# Patient Record
Sex: Female | Born: 1940 | Race: Black or African American | Hispanic: No | Marital: Married | Smoking: Former smoker
Health system: Southern US, Community
[De-identification: ages and names within clinical notes are randomized; demographics above are authoritative.]

## PROBLEM LIST (undated history)

## (undated) DIAGNOSIS — K922 Gastrointestinal hemorrhage, unspecified: Secondary | ICD-10-CM

## (undated) DIAGNOSIS — D649 Anemia, unspecified: Secondary | ICD-10-CM

## (undated) DIAGNOSIS — R011 Cardiac murmur, unspecified: Secondary | ICD-10-CM

## (undated) DIAGNOSIS — Z9289 Personal history of other medical treatment: Secondary | ICD-10-CM

## (undated) DIAGNOSIS — I5032 Chronic diastolic (congestive) heart failure: Secondary | ICD-10-CM

## (undated) DIAGNOSIS — J189 Pneumonia, unspecified organism: Secondary | ICD-10-CM

## (undated) DIAGNOSIS — L439 Lichen planus, unspecified: Secondary | ICD-10-CM

## (undated) DIAGNOSIS — E785 Hyperlipidemia, unspecified: Secondary | ICD-10-CM

## (undated) DIAGNOSIS — R569 Unspecified convulsions: Secondary | ICD-10-CM

## (undated) DIAGNOSIS — L602 Onychogryphosis: Secondary | ICD-10-CM

## (undated) DIAGNOSIS — Z8489 Family history of other specified conditions: Secondary | ICD-10-CM

## (undated) DIAGNOSIS — D509 Iron deficiency anemia, unspecified: Secondary | ICD-10-CM

## (undated) DIAGNOSIS — I272 Pulmonary hypertension, unspecified: Secondary | ICD-10-CM

## (undated) DIAGNOSIS — E119 Type 2 diabetes mellitus without complications: Secondary | ICD-10-CM

## (undated) DIAGNOSIS — Z87898 Personal history of other specified conditions: Secondary | ICD-10-CM

## (undated) DIAGNOSIS — K219 Gastro-esophageal reflux disease without esophagitis: Secondary | ICD-10-CM

## (undated) DIAGNOSIS — R040 Epistaxis: Secondary | ICD-10-CM

## (undated) DIAGNOSIS — I781 Nevus, non-neoplastic: Secondary | ICD-10-CM

## (undated) DIAGNOSIS — I780 Hereditary hemorrhagic telangiectasia: Secondary | ICD-10-CM

## (undated) DIAGNOSIS — K31819 Angiodysplasia of stomach and duodenum without bleeding: Secondary | ICD-10-CM

## (undated) DIAGNOSIS — I10 Essential (primary) hypertension: Secondary | ICD-10-CM

## (undated) HISTORY — DX: Nevus, non-neoplastic: I78.1

## (undated) HISTORY — PX: NASAL HEMORRHAGE CONTROL: SHX287

## (undated) HISTORY — DX: Essential (primary) hypertension: I10

## (undated) HISTORY — DX: Personal history of other specified conditions: Z87.898

## (undated) HISTORY — DX: Onychogryphosis: L60.2

## (undated) HISTORY — DX: Chronic diastolic (congestive) heart failure: I50.32

## (undated) HISTORY — DX: Pulmonary hypertension, unspecified: I27.20

## (undated) HISTORY — PX: CATARACT EXTRACTION: SUR2

## (undated) HISTORY — DX: Anemia, unspecified: D64.9

## (undated) HISTORY — DX: Hyperlipidemia, unspecified: E78.5

## (undated) HISTORY — DX: Lichen planus, unspecified: L43.9

---

## 1997-07-26 ENCOUNTER — Emergency Department (HOSPITAL_COMMUNITY): Admission: EM | Admit: 1997-07-26 | Discharge: 1997-07-26 | Payer: Self-pay | Admitting: Emergency Medicine

## 1998-02-03 ENCOUNTER — Emergency Department (HOSPITAL_COMMUNITY): Admission: EM | Admit: 1998-02-03 | Discharge: 1998-02-03 | Payer: Self-pay | Admitting: Emergency Medicine

## 1998-02-11 ENCOUNTER — Encounter: Payer: Self-pay | Admitting: Emergency Medicine

## 1998-02-11 ENCOUNTER — Emergency Department (HOSPITAL_COMMUNITY): Admission: EM | Admit: 1998-02-11 | Discharge: 1998-02-12 | Payer: Self-pay | Admitting: Emergency Medicine

## 1998-11-20 ENCOUNTER — Encounter: Admission: RE | Admit: 1998-11-20 | Discharge: 1998-11-20 | Payer: Self-pay | Admitting: *Deleted

## 1998-12-05 ENCOUNTER — Inpatient Hospital Stay (HOSPITAL_COMMUNITY): Admission: EM | Admit: 1998-12-05 | Discharge: 1998-12-06 | Payer: Self-pay | Admitting: *Deleted

## 1999-05-02 ENCOUNTER — Emergency Department (HOSPITAL_COMMUNITY): Admission: EM | Admit: 1999-05-02 | Discharge: 1999-05-02 | Payer: Self-pay | Admitting: Emergency Medicine

## 1999-06-21 ENCOUNTER — Emergency Department (HOSPITAL_COMMUNITY): Admission: EM | Admit: 1999-06-21 | Discharge: 1999-06-22 | Payer: Self-pay | Admitting: Emergency Medicine

## 1999-07-21 ENCOUNTER — Emergency Department (HOSPITAL_COMMUNITY): Admission: EM | Admit: 1999-07-21 | Discharge: 1999-07-22 | Payer: Self-pay | Admitting: *Deleted

## 1999-07-27 ENCOUNTER — Emergency Department (HOSPITAL_COMMUNITY): Admission: EM | Admit: 1999-07-27 | Discharge: 1999-07-28 | Payer: Self-pay | Admitting: Emergency Medicine

## 2000-01-24 ENCOUNTER — Ambulatory Visit (HOSPITAL_COMMUNITY): Admission: RE | Admit: 2000-01-24 | Discharge: 2000-01-24 | Payer: Self-pay | Admitting: Internal Medicine

## 2000-02-09 ENCOUNTER — Other Ambulatory Visit: Admission: RE | Admit: 2000-02-09 | Discharge: 2000-02-09 | Payer: Self-pay | Admitting: *Deleted

## 2000-02-09 ENCOUNTER — Encounter (INDEPENDENT_AMBULATORY_CARE_PROVIDER_SITE_OTHER): Payer: Self-pay | Admitting: Specialist

## 2000-04-01 ENCOUNTER — Ambulatory Visit (HOSPITAL_COMMUNITY): Admission: RE | Admit: 2000-04-01 | Discharge: 2000-04-01 | Payer: Self-pay | Admitting: *Deleted

## 2000-04-01 ENCOUNTER — Encounter (INDEPENDENT_AMBULATORY_CARE_PROVIDER_SITE_OTHER): Payer: Self-pay | Admitting: Specialist

## 2000-08-22 ENCOUNTER — Emergency Department (HOSPITAL_COMMUNITY): Admission: EM | Admit: 2000-08-22 | Discharge: 2000-08-22 | Payer: Self-pay | Admitting: Emergency Medicine

## 2000-08-22 ENCOUNTER — Encounter: Payer: Self-pay | Admitting: Emergency Medicine

## 2000-12-21 ENCOUNTER — Ambulatory Visit (HOSPITAL_COMMUNITY): Admission: RE | Admit: 2000-12-21 | Discharge: 2000-12-21 | Payer: Self-pay | Admitting: Family Medicine

## 2000-12-29 ENCOUNTER — Ambulatory Visit (HOSPITAL_COMMUNITY): Admission: RE | Admit: 2000-12-29 | Discharge: 2000-12-29 | Payer: Self-pay | Admitting: Family Medicine

## 2000-12-29 ENCOUNTER — Encounter: Payer: Self-pay | Admitting: Family Medicine

## 2001-02-18 ENCOUNTER — Ambulatory Visit (HOSPITAL_COMMUNITY): Admission: RE | Admit: 2001-02-18 | Discharge: 2001-02-18 | Payer: Self-pay | Admitting: Gastroenterology

## 2001-02-18 ENCOUNTER — Encounter (INDEPENDENT_AMBULATORY_CARE_PROVIDER_SITE_OTHER): Payer: Self-pay | Admitting: Specialist

## 2001-04-19 ENCOUNTER — Emergency Department (HOSPITAL_COMMUNITY): Admission: EM | Admit: 2001-04-19 | Discharge: 2001-04-19 | Payer: Self-pay

## 2001-05-31 ENCOUNTER — Encounter (HOSPITAL_COMMUNITY): Admission: RE | Admit: 2001-05-31 | Discharge: 2001-08-29 | Payer: Self-pay | Admitting: Family Medicine

## 2001-06-07 ENCOUNTER — Encounter: Payer: Self-pay | Admitting: Gastroenterology

## 2001-06-07 ENCOUNTER — Ambulatory Visit (HOSPITAL_COMMUNITY): Admission: RE | Admit: 2001-06-07 | Discharge: 2001-06-07 | Payer: Self-pay | Admitting: Gastroenterology

## 2001-08-03 ENCOUNTER — Ambulatory Visit (HOSPITAL_COMMUNITY): Admission: RE | Admit: 2001-08-03 | Discharge: 2001-08-03 | Payer: Self-pay | Admitting: Gastroenterology

## 2001-12-31 ENCOUNTER — Inpatient Hospital Stay (HOSPITAL_COMMUNITY): Admission: EM | Admit: 2001-12-31 | Discharge: 2002-01-02 | Payer: Self-pay

## 2002-01-01 ENCOUNTER — Encounter: Payer: Self-pay | Admitting: Family Medicine

## 2002-01-02 ENCOUNTER — Encounter: Payer: Self-pay | Admitting: Family Medicine

## 2002-01-03 ENCOUNTER — Ambulatory Visit (HOSPITAL_BASED_OUTPATIENT_CLINIC_OR_DEPARTMENT_OTHER): Admission: RE | Admit: 2002-01-03 | Discharge: 2002-01-03 | Payer: Self-pay | Admitting: Orthopedic Surgery

## 2002-10-25 ENCOUNTER — Encounter: Admission: RE | Admit: 2002-10-25 | Discharge: 2002-12-20 | Payer: Self-pay | Admitting: Internal Medicine

## 2003-06-11 ENCOUNTER — Emergency Department (HOSPITAL_COMMUNITY): Admission: EM | Admit: 2003-06-11 | Discharge: 2003-06-11 | Payer: Self-pay | Admitting: Emergency Medicine

## 2003-06-21 ENCOUNTER — Emergency Department (HOSPITAL_COMMUNITY): Admission: EM | Admit: 2003-06-21 | Discharge: 2003-06-21 | Payer: Self-pay | Admitting: Emergency Medicine

## 2003-07-06 ENCOUNTER — Observation Stay (HOSPITAL_COMMUNITY): Admission: EM | Admit: 2003-07-06 | Discharge: 2003-07-07 | Payer: Self-pay | Admitting: Emergency Medicine

## 2003-08-14 ENCOUNTER — Encounter (INDEPENDENT_AMBULATORY_CARE_PROVIDER_SITE_OTHER): Payer: Self-pay | Admitting: *Deleted

## 2003-08-14 LAB — CONVERTED CEMR LAB

## 2003-08-28 ENCOUNTER — Encounter: Admission: RE | Admit: 2003-08-28 | Discharge: 2003-08-28 | Payer: Self-pay | Admitting: Family Medicine

## 2003-09-28 ENCOUNTER — Encounter: Admission: RE | Admit: 2003-09-28 | Discharge: 2003-09-28 | Payer: Self-pay | Admitting: Family Medicine

## 2003-10-30 ENCOUNTER — Ambulatory Visit: Payer: Self-pay | Admitting: Family Medicine

## 2003-11-28 ENCOUNTER — Ambulatory Visit: Payer: Self-pay | Admitting: Sports Medicine

## 2003-12-26 ENCOUNTER — Ambulatory Visit: Payer: Self-pay | Admitting: Sports Medicine

## 2004-01-21 ENCOUNTER — Ambulatory Visit: Payer: Self-pay | Admitting: Sports Medicine

## 2004-03-05 ENCOUNTER — Ambulatory Visit: Payer: Self-pay | Admitting: Family Medicine

## 2004-03-06 ENCOUNTER — Encounter: Admission: RE | Admit: 2004-03-06 | Discharge: 2004-03-06 | Payer: Self-pay | Admitting: Sports Medicine

## 2004-04-07 ENCOUNTER — Ambulatory Visit: Payer: Self-pay | Admitting: Sports Medicine

## 2004-04-24 ENCOUNTER — Ambulatory Visit: Payer: Self-pay | Admitting: Family Medicine

## 2004-04-24 ENCOUNTER — Inpatient Hospital Stay (HOSPITAL_COMMUNITY): Admission: AD | Admit: 2004-04-24 | Discharge: 2004-04-27 | Payer: Self-pay | Admitting: Sports Medicine

## 2004-04-24 ENCOUNTER — Ambulatory Visit: Payer: Self-pay | Admitting: Sports Medicine

## 2004-06-05 ENCOUNTER — Ambulatory Visit: Payer: Self-pay | Admitting: Sports Medicine

## 2004-06-12 ENCOUNTER — Ambulatory Visit: Payer: Self-pay | Admitting: Sports Medicine

## 2004-06-16 ENCOUNTER — Encounter: Admission: RE | Admit: 2004-06-16 | Discharge: 2004-09-14 | Payer: Self-pay | Admitting: Family Medicine

## 2004-06-18 ENCOUNTER — Encounter: Admission: RE | Admit: 2004-06-18 | Discharge: 2004-06-18 | Payer: Self-pay | Admitting: Sports Medicine

## 2004-06-19 ENCOUNTER — Ambulatory Visit: Payer: Self-pay | Admitting: Family Medicine

## 2004-06-23 ENCOUNTER — Ambulatory Visit: Payer: Self-pay | Admitting: Family Medicine

## 2004-06-23 ENCOUNTER — Encounter (HOSPITAL_COMMUNITY): Admission: RE | Admit: 2004-06-23 | Discharge: 2004-08-19 | Payer: Self-pay | Admitting: Family Medicine

## 2004-06-25 ENCOUNTER — Ambulatory Visit: Payer: Self-pay | Admitting: Family Medicine

## 2004-07-01 ENCOUNTER — Ambulatory Visit: Payer: Self-pay | Admitting: Family Medicine

## 2004-08-04 ENCOUNTER — Ambulatory Visit: Payer: Self-pay | Admitting: Sports Medicine

## 2004-08-19 ENCOUNTER — Ambulatory Visit: Payer: Self-pay | Admitting: Family Medicine

## 2004-08-19 ENCOUNTER — Encounter (HOSPITAL_COMMUNITY): Admission: RE | Admit: 2004-08-19 | Discharge: 2004-08-28 | Payer: Self-pay | Admitting: Unknown Physician Specialty

## 2004-09-01 ENCOUNTER — Ambulatory Visit (HOSPITAL_COMMUNITY): Admission: RE | Admit: 2004-09-01 | Discharge: 2004-09-01 | Payer: Self-pay | Admitting: Gastroenterology

## 2004-09-19 ENCOUNTER — Ambulatory Visit: Payer: Self-pay | Admitting: Family Medicine

## 2004-10-03 ENCOUNTER — Ambulatory Visit: Payer: Self-pay | Admitting: Family Medicine

## 2004-10-09 ENCOUNTER — Encounter: Admission: RE | Admit: 2004-10-09 | Discharge: 2004-10-09 | Payer: Self-pay | Admitting: Sports Medicine

## 2004-10-10 ENCOUNTER — Emergency Department (HOSPITAL_COMMUNITY): Admission: EM | Admit: 2004-10-10 | Discharge: 2004-10-10 | Payer: Self-pay | Admitting: Emergency Medicine

## 2004-10-24 ENCOUNTER — Inpatient Hospital Stay (HOSPITAL_COMMUNITY): Admission: RE | Admit: 2004-10-24 | Discharge: 2004-10-29 | Payer: Self-pay | Admitting: Family Medicine

## 2004-10-24 ENCOUNTER — Ambulatory Visit: Payer: Self-pay | Admitting: Family Medicine

## 2004-10-28 ENCOUNTER — Encounter (INDEPENDENT_AMBULATORY_CARE_PROVIDER_SITE_OTHER): Payer: Self-pay | Admitting: Cardiology

## 2004-11-21 ENCOUNTER — Ambulatory Visit: Payer: Self-pay | Admitting: Family Medicine

## 2004-12-18 ENCOUNTER — Ambulatory Visit: Payer: Self-pay | Admitting: Family Medicine

## 2005-01-09 ENCOUNTER — Ambulatory Visit: Payer: Self-pay | Admitting: Family Medicine

## 2005-01-28 ENCOUNTER — Ambulatory Visit: Payer: Self-pay | Admitting: Family Medicine

## 2005-02-05 ENCOUNTER — Ambulatory Visit: Payer: Self-pay | Admitting: Family Medicine

## 2005-02-05 ENCOUNTER — Inpatient Hospital Stay (HOSPITAL_COMMUNITY): Admission: EM | Admit: 2005-02-05 | Discharge: 2005-02-06 | Payer: Self-pay | Admitting: Emergency Medicine

## 2005-02-10 ENCOUNTER — Encounter: Admission: RE | Admit: 2005-02-10 | Discharge: 2005-05-11 | Payer: Self-pay | Admitting: Family Medicine

## 2005-02-18 ENCOUNTER — Ambulatory Visit: Payer: Self-pay | Admitting: Family Medicine

## 2005-03-03 ENCOUNTER — Ambulatory Visit (HOSPITAL_COMMUNITY): Admission: RE | Admit: 2005-03-03 | Discharge: 2005-03-03 | Payer: Self-pay | Admitting: Family Medicine

## 2005-03-03 ENCOUNTER — Ambulatory Visit: Payer: Self-pay | Admitting: Family Medicine

## 2005-03-13 ENCOUNTER — Ambulatory Visit: Payer: Self-pay | Admitting: Family Medicine

## 2005-03-24 ENCOUNTER — Ambulatory Visit: Payer: Self-pay | Admitting: Sports Medicine

## 2005-03-30 ENCOUNTER — Ambulatory Visit: Payer: Self-pay | Admitting: Family Medicine

## 2005-04-14 ENCOUNTER — Ambulatory Visit: Payer: Self-pay | Admitting: Family Medicine

## 2005-04-28 ENCOUNTER — Ambulatory Visit: Payer: Self-pay | Admitting: Sports Medicine

## 2005-05-14 ENCOUNTER — Ambulatory Visit: Payer: Self-pay | Admitting: Family Medicine

## 2005-05-27 ENCOUNTER — Ambulatory Visit: Payer: Self-pay | Admitting: Family Medicine

## 2005-06-24 ENCOUNTER — Ambulatory Visit: Payer: Self-pay | Admitting: Family Medicine

## 2005-06-26 ENCOUNTER — Ambulatory Visit: Payer: Self-pay | Admitting: Sports Medicine

## 2005-07-15 ENCOUNTER — Encounter (HOSPITAL_COMMUNITY): Admission: RE | Admit: 2005-07-15 | Discharge: 2005-10-13 | Payer: Self-pay | Admitting: Vascular Surgery

## 2005-07-15 ENCOUNTER — Ambulatory Visit: Payer: Self-pay | Admitting: Family Medicine

## 2005-09-04 ENCOUNTER — Inpatient Hospital Stay (HOSPITAL_COMMUNITY): Admission: AD | Admit: 2005-09-04 | Discharge: 2005-09-05 | Payer: Self-pay | Admitting: Family Medicine

## 2005-09-04 ENCOUNTER — Ambulatory Visit: Payer: Self-pay | Admitting: Family Medicine

## 2005-09-07 ENCOUNTER — Ambulatory Visit: Payer: Self-pay | Admitting: Sports Medicine

## 2005-09-14 ENCOUNTER — Ambulatory Visit: Payer: Self-pay | Admitting: Family Medicine

## 2005-10-13 ENCOUNTER — Ambulatory Visit: Payer: Self-pay | Admitting: Sports Medicine

## 2005-10-19 ENCOUNTER — Ambulatory Visit: Payer: Self-pay | Admitting: Family Medicine

## 2005-10-20 ENCOUNTER — Ambulatory Visit: Payer: Self-pay | Admitting: Sports Medicine

## 2005-10-23 ENCOUNTER — Ambulatory Visit: Payer: Self-pay | Admitting: Family Medicine

## 2005-10-26 ENCOUNTER — Ambulatory Visit: Payer: Self-pay | Admitting: Sports Medicine

## 2005-11-05 ENCOUNTER — Ambulatory Visit: Payer: Self-pay | Admitting: Sports Medicine

## 2005-11-18 ENCOUNTER — Ambulatory Visit: Payer: Self-pay | Admitting: Sports Medicine

## 2005-12-07 ENCOUNTER — Ambulatory Visit: Payer: Self-pay | Admitting: Sports Medicine

## 2005-12-26 ENCOUNTER — Ambulatory Visit: Payer: Self-pay | Admitting: Family Medicine

## 2005-12-26 ENCOUNTER — Inpatient Hospital Stay (HOSPITAL_COMMUNITY): Admission: EM | Admit: 2005-12-26 | Discharge: 2005-12-27 | Payer: Self-pay | Admitting: Emergency Medicine

## 2005-12-30 ENCOUNTER — Ambulatory Visit: Payer: Self-pay | Admitting: Family Medicine

## 2006-01-07 ENCOUNTER — Ambulatory Visit: Payer: Self-pay | Admitting: Family Medicine

## 2006-01-07 ENCOUNTER — Encounter (HOSPITAL_COMMUNITY): Admission: RE | Admit: 2006-01-07 | Discharge: 2006-01-08 | Payer: Self-pay | Admitting: Family Medicine

## 2006-01-11 ENCOUNTER — Ambulatory Visit: Payer: Self-pay | Admitting: Sports Medicine

## 2006-01-21 ENCOUNTER — Ambulatory Visit (HOSPITAL_COMMUNITY): Admission: RE | Admit: 2006-01-21 | Discharge: 2006-01-21 | Payer: Self-pay | Admitting: Gastroenterology

## 2006-02-05 ENCOUNTER — Ambulatory Visit: Payer: Self-pay | Admitting: Family Medicine

## 2006-02-23 ENCOUNTER — Ambulatory Visit: Payer: Self-pay | Admitting: Family Medicine

## 2006-02-27 ENCOUNTER — Emergency Department (HOSPITAL_COMMUNITY): Admission: EM | Admit: 2006-02-27 | Discharge: 2006-02-27 | Payer: Self-pay | Admitting: Family Medicine

## 2006-03-08 ENCOUNTER — Ambulatory Visit: Payer: Self-pay | Admitting: Family Medicine

## 2006-03-08 ENCOUNTER — Encounter (HOSPITAL_COMMUNITY): Admission: RE | Admit: 2006-03-08 | Discharge: 2006-03-09 | Payer: Self-pay | Admitting: Family Medicine

## 2006-03-19 ENCOUNTER — Ambulatory Visit: Payer: Self-pay | Admitting: Family Medicine

## 2006-03-19 ENCOUNTER — Observation Stay (HOSPITAL_COMMUNITY): Admission: AD | Admit: 2006-03-19 | Discharge: 2006-03-20 | Payer: Self-pay | Admitting: Internal Medicine

## 2006-03-22 ENCOUNTER — Ambulatory Visit: Payer: Self-pay | Admitting: Family Medicine

## 2006-03-30 ENCOUNTER — Encounter (HOSPITAL_COMMUNITY): Admission: RE | Admit: 2006-03-30 | Discharge: 2006-03-31 | Payer: Self-pay | Admitting: Family Medicine

## 2006-03-30 ENCOUNTER — Ambulatory Visit: Payer: Self-pay | Admitting: Family Medicine

## 2006-04-01 ENCOUNTER — Ambulatory Visit (HOSPITAL_COMMUNITY): Admission: RE | Admit: 2006-04-01 | Discharge: 2006-04-01 | Payer: Self-pay

## 2006-04-06 ENCOUNTER — Ambulatory Visit: Payer: Self-pay | Admitting: Family Medicine

## 2006-04-08 DIAGNOSIS — D500 Iron deficiency anemia secondary to blood loss (chronic): Secondary | ICD-10-CM

## 2006-04-08 DIAGNOSIS — I10 Essential (primary) hypertension: Secondary | ICD-10-CM

## 2006-04-08 DIAGNOSIS — E785 Hyperlipidemia, unspecified: Secondary | ICD-10-CM

## 2006-04-08 DIAGNOSIS — E118 Type 2 diabetes mellitus with unspecified complications: Secondary | ICD-10-CM

## 2006-04-08 DIAGNOSIS — Z794 Long term (current) use of insulin: Secondary | ICD-10-CM

## 2006-04-09 ENCOUNTER — Encounter (INDEPENDENT_AMBULATORY_CARE_PROVIDER_SITE_OTHER): Payer: Self-pay | Admitting: *Deleted

## 2006-04-15 ENCOUNTER — Telehealth (INDEPENDENT_AMBULATORY_CARE_PROVIDER_SITE_OTHER): Payer: Self-pay | Admitting: *Deleted

## 2006-04-21 ENCOUNTER — Encounter (INDEPENDENT_AMBULATORY_CARE_PROVIDER_SITE_OTHER): Payer: Self-pay | Admitting: Family Medicine

## 2006-04-21 ENCOUNTER — Ambulatory Visit: Payer: Self-pay | Admitting: Sports Medicine

## 2006-04-21 DIAGNOSIS — D509 Iron deficiency anemia, unspecified: Secondary | ICD-10-CM

## 2006-04-21 LAB — CONVERTED CEMR LAB: Hemoglobin: 9.8 g/dL

## 2006-05-03 ENCOUNTER — Telehealth: Payer: Self-pay | Admitting: *Deleted

## 2006-05-05 ENCOUNTER — Ambulatory Visit: Payer: Self-pay | Admitting: Family Medicine

## 2006-05-12 ENCOUNTER — Ambulatory Visit: Payer: Self-pay | Admitting: Family Medicine

## 2006-05-12 ENCOUNTER — Observation Stay (HOSPITAL_COMMUNITY): Admission: AD | Admit: 2006-05-12 | Discharge: 2006-05-13 | Payer: Self-pay | Admitting: Family Medicine

## 2006-05-12 ENCOUNTER — Telehealth: Payer: Self-pay | Admitting: *Deleted

## 2006-05-12 LAB — CONVERTED CEMR LAB: Hemoglobin: 7.8 g/dL

## 2006-05-13 ENCOUNTER — Ambulatory Visit: Payer: Self-pay | Admitting: Family Medicine

## 2006-05-13 LAB — CONVERTED CEMR LAB: Hemoglobin: 10.8 g/dL

## 2006-05-14 ENCOUNTER — Encounter (INDEPENDENT_AMBULATORY_CARE_PROVIDER_SITE_OTHER): Payer: Self-pay | Admitting: Family Medicine

## 2006-05-19 ENCOUNTER — Ambulatory Visit (HOSPITAL_COMMUNITY): Admission: RE | Admit: 2006-05-19 | Discharge: 2006-05-19 | Payer: Self-pay | Admitting: Gastroenterology

## 2006-05-21 ENCOUNTER — Encounter (INDEPENDENT_AMBULATORY_CARE_PROVIDER_SITE_OTHER): Payer: Self-pay | Admitting: Family Medicine

## 2006-05-21 ENCOUNTER — Ambulatory Visit: Payer: Self-pay | Admitting: Family Medicine

## 2006-05-21 DIAGNOSIS — R040 Epistaxis: Secondary | ICD-10-CM

## 2006-05-21 LAB — CONVERTED CEMR LAB
INR: 1.1 (ref 0.0–1.5)
Prothrombin Time: 14.3 s (ref 11.6–15.2)
aPTT: 32 s (ref 24–37)

## 2006-05-23 ENCOUNTER — Encounter (INDEPENDENT_AMBULATORY_CARE_PROVIDER_SITE_OTHER): Payer: Self-pay | Admitting: Family Medicine

## 2006-05-24 ENCOUNTER — Encounter (INDEPENDENT_AMBULATORY_CARE_PROVIDER_SITE_OTHER): Payer: Self-pay | Admitting: Family Medicine

## 2006-05-26 ENCOUNTER — Ambulatory Visit: Payer: Self-pay | Admitting: Family Medicine

## 2006-05-28 ENCOUNTER — Ambulatory Visit: Payer: Self-pay | Admitting: Family Medicine

## 2006-05-28 ENCOUNTER — Observation Stay (HOSPITAL_COMMUNITY): Admission: AD | Admit: 2006-05-28 | Discharge: 2006-05-29 | Payer: Self-pay | Admitting: Internal Medicine

## 2006-05-31 ENCOUNTER — Ambulatory Visit: Payer: Self-pay | Admitting: Sports Medicine

## 2006-06-02 ENCOUNTER — Ambulatory Visit (HOSPITAL_COMMUNITY): Admission: RE | Admit: 2006-06-02 | Discharge: 2006-06-02 | Payer: Self-pay | Admitting: Gastroenterology

## 2006-06-07 ENCOUNTER — Ambulatory Visit: Payer: Self-pay | Admitting: Sports Medicine

## 2006-06-08 ENCOUNTER — Telehealth: Payer: Self-pay | Admitting: *Deleted

## 2006-06-09 ENCOUNTER — Encounter (INDEPENDENT_AMBULATORY_CARE_PROVIDER_SITE_OTHER): Payer: Self-pay | Admitting: Family Medicine

## 2006-06-09 ENCOUNTER — Ambulatory Visit: Payer: Self-pay | Admitting: Family Medicine

## 2006-06-14 ENCOUNTER — Ambulatory Visit: Payer: Self-pay | Admitting: Family Medicine

## 2006-06-14 LAB — CONVERTED CEMR LAB
Basophils Absolute: <0.2 10*3/uL
Eosinophils Absolute: <0.7 10*3/uL
Granulocyte count absolute: 3.4 10*3/uL
HCT: 32 %
MCV: 81.6 fL
RBC: 3.92 M/uL
WBC: 5.1 10*3/uL

## 2006-06-18 ENCOUNTER — Ambulatory Visit: Payer: Self-pay | Admitting: Family Medicine

## 2006-06-18 LAB — CONVERTED CEMR LAB: Hemoglobin: 8.9 g/dL

## 2006-06-21 ENCOUNTER — Ambulatory Visit: Payer: Self-pay | Admitting: Sports Medicine

## 2006-06-21 LAB — CONVERTED CEMR LAB: Hemoglobin: 7.8 g/dL

## 2006-06-22 ENCOUNTER — Encounter (HOSPITAL_COMMUNITY): Admission: RE | Admit: 2006-06-22 | Discharge: 2006-09-20 | Payer: Self-pay | Admitting: Neurology

## 2006-06-23 ENCOUNTER — Ambulatory Visit: Payer: Self-pay | Admitting: Family Medicine

## 2006-06-25 ENCOUNTER — Encounter (INDEPENDENT_AMBULATORY_CARE_PROVIDER_SITE_OTHER): Payer: Self-pay | Admitting: Family Medicine

## 2006-06-28 ENCOUNTER — Ambulatory Visit: Payer: Self-pay | Admitting: Family Medicine

## 2006-07-02 ENCOUNTER — Ambulatory Visit: Payer: Self-pay

## 2006-07-06 ENCOUNTER — Ambulatory Visit: Payer: Self-pay | Admitting: Family Medicine

## 2006-07-09 ENCOUNTER — Ambulatory Visit: Payer: Self-pay | Admitting: Family Medicine

## 2006-07-13 ENCOUNTER — Ambulatory Visit: Payer: Self-pay | Admitting: Sports Medicine

## 2006-07-15 ENCOUNTER — Ambulatory Visit: Payer: Self-pay | Admitting: Family Medicine

## 2006-07-15 LAB — CONVERTED CEMR LAB: Hemoglobin: 9.2 g/dL

## 2006-07-20 ENCOUNTER — Telehealth: Payer: Self-pay | Admitting: *Deleted

## 2006-07-20 ENCOUNTER — Encounter: Payer: Self-pay | Admitting: Family Medicine

## 2006-07-20 ENCOUNTER — Ambulatory Visit: Payer: Self-pay | Admitting: Family Medicine

## 2006-07-20 ENCOUNTER — Ambulatory Visit: Payer: Self-pay | Admitting: Sports Medicine

## 2006-07-20 LAB — CONVERTED CEMR LAB: Hemoglobin: 7.7 g/dL

## 2006-07-21 ENCOUNTER — Telehealth (INDEPENDENT_AMBULATORY_CARE_PROVIDER_SITE_OTHER): Payer: Self-pay | Admitting: Family Medicine

## 2006-07-21 ENCOUNTER — Telehealth (INDEPENDENT_AMBULATORY_CARE_PROVIDER_SITE_OTHER): Payer: Self-pay | Admitting: *Deleted

## 2006-08-05 ENCOUNTER — Ambulatory Visit: Payer: Self-pay | Admitting: Family Medicine

## 2006-08-06 ENCOUNTER — Telehealth: Payer: Self-pay | Admitting: *Deleted

## 2006-08-09 ENCOUNTER — Ambulatory Visit: Payer: Self-pay | Admitting: Sports Medicine

## 2006-08-09 LAB — CONVERTED CEMR LAB: Hemoglobin: 9.2 g/dL

## 2006-08-19 ENCOUNTER — Ambulatory Visit (HOSPITAL_COMMUNITY): Admission: RE | Admit: 2006-08-19 | Discharge: 2006-08-19 | Payer: Self-pay | Admitting: Gastroenterology

## 2006-08-19 HISTORY — PX: ESOPHAGOGASTRODUODENOSCOPY ENDOSCOPY: SHX5814

## 2006-08-26 ENCOUNTER — Encounter (INDEPENDENT_AMBULATORY_CARE_PROVIDER_SITE_OTHER): Payer: Self-pay | Admitting: Family Medicine

## 2006-08-26 ENCOUNTER — Ambulatory Visit: Payer: Self-pay | Admitting: Family Medicine

## 2006-08-26 LAB — CONVERTED CEMR LAB: Hemoglobin: 7.8 g/dL

## 2006-08-27 ENCOUNTER — Observation Stay (HOSPITAL_COMMUNITY): Admission: AD | Admit: 2006-08-27 | Discharge: 2006-08-27 | Payer: Self-pay | Admitting: Family Medicine

## 2006-09-01 ENCOUNTER — Ambulatory Visit: Payer: Self-pay | Admitting: Family Medicine

## 2006-09-01 LAB — CONVERTED CEMR LAB: Hemoglobin: 9 g/dL

## 2006-09-06 ENCOUNTER — Ambulatory Visit: Payer: Self-pay | Admitting: Family Medicine

## 2006-09-18 ENCOUNTER — Emergency Department (HOSPITAL_COMMUNITY): Admission: EM | Admit: 2006-09-18 | Discharge: 2006-09-18 | Payer: Self-pay | Admitting: Emergency Medicine

## 2006-09-21 ENCOUNTER — Encounter (INDEPENDENT_AMBULATORY_CARE_PROVIDER_SITE_OTHER): Payer: Self-pay | Admitting: *Deleted

## 2006-09-22 ENCOUNTER — Inpatient Hospital Stay (HOSPITAL_COMMUNITY): Admission: AD | Admit: 2006-09-22 | Discharge: 2006-09-22 | Payer: Self-pay | Admitting: Gynecology

## 2006-09-24 ENCOUNTER — Ambulatory Visit: Payer: Self-pay | Admitting: Hematology and Oncology

## 2006-10-13 ENCOUNTER — Emergency Department (HOSPITAL_COMMUNITY): Admission: EM | Admit: 2006-10-13 | Discharge: 2006-10-13 | Payer: Self-pay | Admitting: Family Medicine

## 2006-10-15 ENCOUNTER — Emergency Department (HOSPITAL_COMMUNITY): Admission: EM | Admit: 2006-10-15 | Discharge: 2006-10-15 | Payer: Self-pay | Admitting: Emergency Medicine

## 2006-10-19 LAB — CBC & DIFF AND RETIC
BASO%: 0.2 % (ref 0.0–2.0)
LYMPH%: 12.9 % — ABNORMAL LOW (ref 14.0–48.0)
MCHC: 33.2 g/dL (ref 32.0–36.0)
MONO#: 0.6 10*3/uL (ref 0.1–0.9)
MONO%: 10.9 % (ref 0.0–13.0)
NEUT#: 4.1 10*3/uL (ref 1.5–6.5)
Platelets: 287 10*3/uL (ref 145–400)
RBC: 3.24 10*6/uL — ABNORMAL LOW (ref 3.70–5.32)
RDW: 19.6 % — ABNORMAL HIGH (ref 11.3–14.5)
RETIC #: 120.9 10*3/uL — ABNORMAL HIGH (ref 19.7–115.1)
Retic %: 3.7 % — ABNORMAL HIGH (ref 0.4–2.3)
WBC: 5.7 10*3/uL (ref 3.9–10.0)

## 2006-10-19 LAB — URINALYSIS, MICROSCOPIC - CHCC
Bilirubin (Urine): NEGATIVE
Blood: NEGATIVE
Ketones: NEGATIVE mg/dL
Leukocyte Esterase: NEGATIVE
Nitrite: NEGATIVE
Protein: NEGATIVE mg/dL
RBC count: NEGATIVE (ref 0–2)
Specific Gravity, Urine: 1.025 (ref 1.003–1.035)
pH: 6 (ref 4.6–8.0)

## 2006-10-19 LAB — CHCC SMEAR

## 2006-10-20 ENCOUNTER — Ambulatory Visit (HOSPITAL_COMMUNITY): Admission: RE | Admit: 2006-10-20 | Discharge: 2006-10-20 | Payer: Self-pay | Admitting: Internal Medicine

## 2006-10-21 LAB — HEMOGLOBINOPATHY EVALUATION: Hgb S Quant: 0 % (ref 0.0–0.0)

## 2006-10-25 LAB — PROTEIN ELECTROPHORESIS, SERUM
Albumin ELP: 60.8 % (ref 55.8–66.1)
Alpha-1-Globulin: 5.4 % — ABNORMAL HIGH (ref 2.9–4.9)
Alpha-2-Globulin: 9.8 % (ref 7.1–11.8)
Beta 2: 5.4 % (ref 3.2–6.5)
Beta Globulin: 6.6 % (ref 4.7–7.2)
Gamma Globulin: 12 % (ref 11.1–18.8)
Total Protein, Serum Electrophoresis: 6.3 g/dL (ref 6.0–8.3)

## 2006-10-25 LAB — VON WILLEBRAND FACTOR MULTIMER
Factor-VIII Activity: 202 % — ABNORMAL HIGH (ref 75–150)
Ristocetin-Cofactor: >150 % — ABNORMAL HIGH (ref 50–150)
Von Willebrand Ag: 318 %{normal} — ABNORMAL HIGH (ref 61–164)
Von Willebrand Multimers: NORMAL

## 2006-10-25 LAB — COMPREHENSIVE METABOLIC PANEL
AST: 14 U/L (ref 0–37)
Alkaline Phosphatase: 70 U/L (ref 39–117)
BUN: 13 mg/dL (ref 6–23)
Glucose, Bld: 289 mg/dL — ABNORMAL HIGH (ref 70–99)
Sodium: 140 mEq/L (ref 135–145)
Total Bilirubin: 0.7 mg/dL (ref 0.3–1.2)

## 2006-10-25 LAB — ERYTHROPOIETIN: Erythropoietin: 360 m[IU]/mL — ABNORMAL HIGH (ref 2.6–34.0)

## 2006-10-25 LAB — IRON AND TIBC
%SAT: 5 % — ABNORMAL LOW (ref 20–55)
Iron: 18 ug/dL — ABNORMAL LOW (ref 42–145)
TIBC: 340 ug/dL (ref 250–470)
UIBC: 322 ug/dL

## 2006-10-25 LAB — VITAMIN B12: Vitamin B-12: 517 pg/mL (ref 211–911)

## 2006-10-25 LAB — HAPTOGLOBIN: Haptoglobin: 57 mg/dL (ref 16–200)

## 2006-10-25 LAB — APTT: aPTT: 33 s (ref 24–37)

## 2006-10-25 LAB — FERRITIN: Ferritin: 2 ng/mL — ABNORMAL LOW (ref 10–291)

## 2006-11-03 LAB — CBC WITH DIFFERENTIAL/PLATELET
Basophils Absolute: 0 10*3/uL (ref 0.0–0.1)
Eosinophils Absolute: 0.3 10*3/uL (ref 0.0–0.5)
HCT: 31.2 % — ABNORMAL LOW (ref 34.8–46.6)
HGB: 10.4 g/dL — ABNORMAL LOW (ref 11.6–15.9)
MCV: 82.6 fL (ref 81.0–101.0)
MONO%: 9.8 % (ref 0.0–13.0)
NEUT#: 3.2 10*3/uL (ref 1.5–6.5)
Platelets: 230 10*3/uL (ref 145–400)
RDW: 29.8 % — ABNORMAL HIGH (ref 11.3–14.5)

## 2006-11-03 LAB — IRON AND TIBC
TIBC: 344 ug/dL (ref 250–470)
UIBC: 244 ug/dL

## 2006-11-03 LAB — FERRITIN: Ferritin: 424 ng/mL — ABNORMAL HIGH (ref 10–291)

## 2006-12-20 ENCOUNTER — Ambulatory Visit: Payer: Self-pay | Admitting: Hematology and Oncology

## 2006-12-22 LAB — CBC WITH DIFFERENTIAL/PLATELET
BASO%: 0.2 % (ref 0.0–2.0)
LYMPH%: 17.6 % (ref 14.0–48.0)
MCHC: 34.7 g/dL (ref 32.0–36.0)
MCV: 86.5 fL (ref 81.0–101.0)
MONO#: 0.5 10*3/uL (ref 0.1–0.9)
MONO%: 11.6 % (ref 0.0–13.0)
NEUT#: 2.8 10*3/uL (ref 1.5–6.5)
Platelets: 209 10*3/uL (ref 145–400)
RBC: 3.12 10*6/uL — ABNORMAL LOW (ref 3.70–5.32)
RDW: 18.4 % — ABNORMAL HIGH (ref 11.3–14.5)
WBC: 4.3 10*3/uL (ref 3.9–10.0)

## 2006-12-22 LAB — IRON AND TIBC
%SAT: 17 % — ABNORMAL LOW (ref 20–55)
Iron: 44 ug/dL (ref 42–145)
TIBC: 265 ug/dL (ref 250–470)

## 2006-12-22 LAB — BASIC METABOLIC PANEL
Calcium: 9.2 mg/dL (ref 8.4–10.5)
Potassium: 3.6 mEq/L (ref 3.5–5.3)
Sodium: 143 mEq/L (ref 135–145)

## 2006-12-22 LAB — FERRITIN: Ferritin: 23 ng/mL (ref 10–291)

## 2007-01-05 ENCOUNTER — Observation Stay (HOSPITAL_COMMUNITY): Admission: EM | Admit: 2007-01-05 | Discharge: 2007-01-06 | Payer: Self-pay | Admitting: Family Medicine

## 2007-01-31 ENCOUNTER — Ambulatory Visit (HOSPITAL_COMMUNITY): Admission: RE | Admit: 2007-01-31 | Discharge: 2007-01-31 | Payer: Self-pay | Admitting: Internal Medicine

## 2007-01-31 ENCOUNTER — Inpatient Hospital Stay (HOSPITAL_COMMUNITY): Admission: EM | Admit: 2007-01-31 | Discharge: 2007-02-01 | Payer: Self-pay | Admitting: Emergency Medicine

## 2007-03-10 ENCOUNTER — Inpatient Hospital Stay (HOSPITAL_COMMUNITY): Admission: AD | Admit: 2007-03-10 | Discharge: 2007-03-11 | Payer: Self-pay | Admitting: Internal Medicine

## 2007-03-22 ENCOUNTER — Ambulatory Visit: Payer: Self-pay | Admitting: Hematology and Oncology

## 2007-03-23 LAB — CBC WITH DIFFERENTIAL/PLATELET
BASO%: 0 % (ref 0.0–2.0)
Eosinophils Absolute: 0.3 10*3/uL (ref 0.0–0.5)
LYMPH%: 12.7 % — ABNORMAL LOW (ref 14.0–48.0)
MCH: 23.7 pg — ABNORMAL LOW (ref 26.0–34.0)
MCHC: 31.3 g/dL — ABNORMAL LOW (ref 32.0–36.0)
MCV: 76 fL — ABNORMAL LOW (ref 81.0–101.0)
MONO%: 8.1 % (ref 0.0–13.0)
Platelets: 473 10*3/uL — ABNORMAL HIGH (ref 145–400)
RBC: 3 10*6/uL — ABNORMAL LOW (ref 3.70–5.32)

## 2007-03-23 LAB — BASIC METABOLIC PANEL
Calcium: 9.4 mg/dL (ref 8.4–10.5)
Glucose, Bld: 279 mg/dL — ABNORMAL HIGH (ref 70–99)
Sodium: 140 mEq/L (ref 135–145)

## 2007-03-23 LAB — IRON AND TIBC: TIBC: 331 ug/dL (ref 250–470)

## 2007-03-23 LAB — FERRITIN: Ferritin: 11 ng/mL (ref 10–291)

## 2007-03-24 ENCOUNTER — Emergency Department (HOSPITAL_COMMUNITY): Admission: EM | Admit: 2007-03-24 | Discharge: 2007-03-24 | Payer: Self-pay | Admitting: Emergency Medicine

## 2007-04-28 LAB — CBC WITH DIFFERENTIAL/PLATELET
Basophils Absolute: 0 10*3/uL (ref 0.0–0.1)
EOS%: 6.4 % (ref 0.0–7.0)
Eosinophils Absolute: 0.3 10*3/uL (ref 0.0–0.5)
HCT: 32 % — ABNORMAL LOW (ref 34.8–46.6)
HGB: 10.9 g/dL — ABNORMAL LOW (ref 11.6–15.9)
LYMPH%: 16.4 % (ref 14.0–48.0)
MCH: 28.4 pg (ref 26.0–34.0)
MCV: 83.7 fL (ref 81.0–101.0)
MONO%: 9.4 % (ref 0.0–13.0)
NEUT#: 3.3 10*3/uL (ref 1.5–6.5)
NEUT%: 67.3 % (ref 39.6–76.8)
Platelets: 369 10*3/uL (ref 145–400)

## 2007-04-28 LAB — IRON AND TIBC: %SAT: 27 % (ref 20–55)

## 2007-04-28 LAB — BASIC METABOLIC PANEL
BUN: 17 mg/dL (ref 6–23)
Calcium: 10 mg/dL (ref 8.4–10.5)
Creatinine, Ser: 0.82 mg/dL (ref 0.40–1.20)
Glucose, Bld: 263 mg/dL — ABNORMAL HIGH (ref 70–99)
Potassium: 4 mEq/L (ref 3.5–5.3)

## 2007-04-28 LAB — FERRITIN: Ferritin: 93 ng/mL (ref 10–291)

## 2007-05-03 ENCOUNTER — Ambulatory Visit: Payer: Self-pay | Admitting: Hematology and Oncology

## 2007-06-14 ENCOUNTER — Ambulatory Visit: Payer: Self-pay | Admitting: Hematology and Oncology

## 2007-09-09 ENCOUNTER — Emergency Department (HOSPITAL_COMMUNITY): Admission: EM | Admit: 2007-09-09 | Discharge: 2007-09-09 | Payer: Self-pay | Admitting: Family Medicine

## 2007-09-25 ENCOUNTER — Ambulatory Visit: Payer: Self-pay | Admitting: Hematology and Oncology

## 2007-09-28 LAB — CBC WITH DIFFERENTIAL/PLATELET
Basophils Absolute: 0 10*3/uL (ref 0.0–0.1)
EOS%: 8.3 % — ABNORMAL HIGH (ref 0.0–7.0)
Eosinophils Absolute: 0.3 10*3/uL (ref 0.0–0.5)
HCT: 24.9 % — ABNORMAL LOW (ref 34.8–46.6)
HGB: 8.1 g/dL — ABNORMAL LOW (ref 11.6–15.9)
MCH: 24.6 pg — ABNORMAL LOW (ref 26.0–34.0)
MONO#: 0.6 10*3/uL (ref 0.1–0.9)
NEUT#: 2.3 10*3/uL (ref 1.5–6.5)
RDW: 17.8 % — ABNORMAL HIGH (ref 11.3–14.5)
WBC: 3.8 10*3/uL — ABNORMAL LOW (ref 3.9–10.0)
lymph#: 0.6 10*3/uL — ABNORMAL LOW (ref 0.9–3.3)

## 2007-09-28 LAB — BASIC METABOLIC PANEL
BUN: 15 mg/dL (ref 6–23)
CO2: 24 mEq/L (ref 19–32)
Chloride: 110 mEq/L (ref 96–112)
Potassium: 3.7 mEq/L (ref 3.5–5.3)

## 2008-01-19 ENCOUNTER — Encounter: Payer: Self-pay | Admitting: *Deleted

## 2008-01-19 ENCOUNTER — Telehealth: Payer: Self-pay | Admitting: *Deleted

## 2008-01-20 ENCOUNTER — Ambulatory Visit: Payer: Self-pay | Admitting: Hematology and Oncology

## 2008-01-24 LAB — IRON AND TIBC
%SAT: 6 % — ABNORMAL LOW (ref 20–55)
TIBC: 312 ug/dL (ref 250–470)

## 2008-01-24 LAB — BASIC METABOLIC PANEL
BUN: 14 mg/dL (ref 6–23)
CO2: 22 mEq/L (ref 19–32)
Chloride: 107 mEq/L (ref 96–112)
Glucose, Bld: 99 mg/dL (ref 70–99)
Potassium: 4 mEq/L (ref 3.5–5.3)
Sodium: 140 mEq/L (ref 135–145)

## 2008-01-24 LAB — CBC WITH DIFFERENTIAL/PLATELET
BASO%: 0.9 % (ref 0.0–2.0)
HCT: 27.4 % — ABNORMAL LOW (ref 34.8–46.6)
LYMPH%: 15.9 % (ref 14.0–48.0)
MCH: 25.7 pg — ABNORMAL LOW (ref 26.0–34.0)
MCHC: 32.5 g/dL (ref 32.0–36.0)
MCV: 78.9 fL — ABNORMAL LOW (ref 81.0–101.0)
MONO#: 0.5 10*3/uL (ref 0.1–0.9)
MONO%: 10.4 % (ref 0.0–13.0)
NEUT%: 69 % (ref 39.6–76.8)
Platelets: 269 10*3/uL (ref 145–400)
WBC: 4.9 10*3/uL (ref 3.9–10.0)

## 2008-04-07 ENCOUNTER — Emergency Department (HOSPITAL_COMMUNITY): Admission: EM | Admit: 2008-04-07 | Discharge: 2008-04-07 | Payer: Self-pay | Admitting: Emergency Medicine

## 2008-04-24 ENCOUNTER — Encounter: Admission: RE | Admit: 2008-04-24 | Discharge: 2008-04-24 | Payer: Self-pay | Admitting: Internal Medicine

## 2008-04-25 ENCOUNTER — Inpatient Hospital Stay (HOSPITAL_COMMUNITY): Admission: EM | Admit: 2008-04-25 | Discharge: 2008-04-26 | Payer: Self-pay | Admitting: Emergency Medicine

## 2008-05-04 ENCOUNTER — Ambulatory Visit: Payer: Self-pay | Admitting: Hematology and Oncology

## 2008-05-08 LAB — CBC WITH DIFFERENTIAL/PLATELET
BASO%: 0.5 % (ref 0.0–2.0)
Basophils Absolute: 0 10*3/uL (ref 0.0–0.1)
EOS%: 7.1 % — ABNORMAL HIGH (ref 0.0–7.0)
HCT: 21.6 % — ABNORMAL LOW (ref 34.8–46.6)
HGB: 6.3 g/dL — CL (ref 11.6–15.9)
LYMPH%: 15.1 % (ref 14.0–49.7)
MCH: 22.7 pg — ABNORMAL LOW (ref 25.1–34.0)
MCHC: 29.2 g/dL — ABNORMAL LOW (ref 31.5–36.0)
MCV: 77.7 fL — ABNORMAL LOW (ref 79.5–101.0)
MONO%: 9.8 % (ref 0.0–14.0)
NEUT%: 67.5 % (ref 38.4–76.8)

## 2008-05-08 LAB — BASIC METABOLIC PANEL
BUN: 15 mg/dL (ref 6–23)
Calcium: 8.4 mg/dL (ref 8.4–10.5)
Creatinine, Ser: 0.85 mg/dL (ref 0.40–1.20)

## 2008-05-08 LAB — IRON AND TIBC
Iron: 14 ug/dL — ABNORMAL LOW (ref 42–145)
UIBC: 288 ug/dL

## 2008-05-12 ENCOUNTER — Inpatient Hospital Stay (HOSPITAL_COMMUNITY): Admission: EM | Admit: 2008-05-12 | Discharge: 2008-05-15 | Payer: Self-pay | Admitting: Family Medicine

## 2008-06-13 LAB — CBC WITH DIFFERENTIAL/PLATELET
Basophils Absolute: 0 10*3/uL (ref 0.0–0.1)
EOS%: 6.3 % (ref 0.0–7.0)
Eosinophils Absolute: 0.3 10*3/uL (ref 0.0–0.5)
HGB: 8.5 g/dL — ABNORMAL LOW (ref 11.6–15.9)
MCH: 23 pg — ABNORMAL LOW (ref 25.1–34.0)
NEUT#: 2.9 10*3/uL (ref 1.5–6.5)
RDW: 17.4 % — ABNORMAL HIGH (ref 11.2–14.5)
lymph#: 1.1 10*3/uL (ref 0.9–3.3)

## 2008-06-13 LAB — FERRITIN: Ferritin: 4 ng/mL — ABNORMAL LOW (ref 10–291)

## 2008-06-13 LAB — IRON AND TIBC
Iron: 27 ug/dL — ABNORMAL LOW (ref 42–145)
UIBC: 295 ug/dL

## 2008-06-20 ENCOUNTER — Ambulatory Visit: Payer: Self-pay | Admitting: Hematology and Oncology

## 2008-09-03 ENCOUNTER — Emergency Department (HOSPITAL_COMMUNITY): Admission: EM | Admit: 2008-09-03 | Discharge: 2008-09-03 | Payer: Self-pay | Admitting: Emergency Medicine

## 2008-10-11 ENCOUNTER — Ambulatory Visit: Payer: Self-pay | Admitting: Hematology and Oncology

## 2008-10-16 LAB — CBC WITH DIFFERENTIAL/PLATELET
BASO%: 0 % (ref 0.0–2.0)
EOS%: 4.3 % (ref 0.0–7.0)
HCT: 20.6 % — ABNORMAL LOW (ref 34.8–46.6)
HGB: 6.4 g/dL — CL (ref 11.6–15.9)
MCH: 20.8 pg — ABNORMAL LOW (ref 25.1–34.0)
MCHC: 31.2 g/dL — ABNORMAL LOW (ref 31.5–36.0)
MONO#: 0.5 10*3/uL (ref 0.1–0.9)
NEUT%: 72.4 % (ref 38.4–76.8)
RDW: 23.5 % — ABNORMAL HIGH (ref 11.2–14.5)
WBC: 4.3 10*3/uL (ref 3.9–10.3)
lymph#: 0.5 10*3/uL — ABNORMAL LOW (ref 0.9–3.3)

## 2008-10-16 LAB — BASIC METABOLIC PANEL
CO2: 27 mEq/L (ref 19–32)
Chloride: 110 mEq/L (ref 96–112)
Creatinine, Ser: 0.78 mg/dL (ref 0.40–1.20)
Potassium: 3.9 mEq/L (ref 3.5–5.3)

## 2008-10-16 LAB — RETICULOCYTES
RBC: 3.23 10*6/uL — ABNORMAL LOW (ref 3.70–5.45)
Retic %: 1.36 % (ref 0.50–1.50)

## 2008-10-17 ENCOUNTER — Encounter (HOSPITAL_COMMUNITY): Admission: RE | Admit: 2008-10-17 | Discharge: 2008-11-08 | Payer: Self-pay | Admitting: Hematology and Oncology

## 2008-10-17 LAB — IRON AND TIBC
%SAT: 4 % — ABNORMAL LOW (ref 20–55)
TIBC: 355 ug/dL (ref 250–470)
UIBC: 341 ug/dL

## 2008-10-17 LAB — TYPE & CROSSMATCH - CHCC

## 2008-10-17 LAB — FERRITIN: Ferritin: 3 ng/mL — ABNORMAL LOW (ref 10–291)

## 2008-10-22 LAB — FECAL OCCULT BLOOD, GUAIAC: Occult Blood: NEGATIVE

## 2008-10-24 LAB — CBC WITH DIFFERENTIAL/PLATELET
BASO%: 0.7 % (ref 0.0–2.0)
Basophils Absolute: 0 10*3/uL (ref 0.0–0.1)
EOS%: 5.2 % (ref 0.0–7.0)
HGB: 9.6 g/dL — ABNORMAL LOW (ref 11.6–15.9)
MCH: 24.4 pg — ABNORMAL LOW (ref 25.1–34.0)
MCHC: 32.4 g/dL (ref 31.5–36.0)
MCV: 75.2 fL — ABNORMAL LOW (ref 79.5–101.0)
MONO%: 8.4 % (ref 0.0–14.0)
RBC: 3.94 10*6/uL (ref 3.70–5.45)
RDW: 32 % — ABNORMAL HIGH (ref 11.2–14.5)
lymph#: 0.6 10*3/uL — ABNORMAL LOW (ref 0.9–3.3)

## 2008-10-24 LAB — FERRITIN: Ferritin: 124 ng/mL (ref 10–291)

## 2008-10-30 LAB — CBC WITH DIFFERENTIAL/PLATELET
BASO%: 0.1 % (ref 0.0–2.0)
Eosinophils Absolute: 0.3 10*3/uL (ref 0.0–0.5)
LYMPH%: 11.4 % — ABNORMAL LOW (ref 14.0–49.7)
MCHC: 32.7 g/dL (ref 31.5–36.0)
MONO#: 0.4 10*3/uL (ref 0.1–0.9)
NEUT#: 4.4 10*3/uL (ref 1.5–6.5)
Platelets: 225 10*3/uL (ref 145–400)
RBC: 4.05 10*6/uL (ref 3.70–5.45)
RDW: 34.3 % — ABNORMAL HIGH (ref 11.2–14.5)
WBC: 5.7 10*3/uL (ref 3.9–10.3)
lymph#: 0.6 10*3/uL — ABNORMAL LOW (ref 0.9–3.3)

## 2008-10-30 LAB — COMPREHENSIVE METABOLIC PANEL
ALT: 41 U/L — ABNORMAL HIGH (ref 0–35)
Albumin: 4.1 g/dL (ref 3.5–5.2)
Alkaline Phosphatase: 106 U/L (ref 39–117)
CO2: 24 mEq/L (ref 19–32)
Glucose, Bld: 238 mg/dL — ABNORMAL HIGH (ref 70–99)
Potassium: 4.3 mEq/L (ref 3.5–5.3)
Sodium: 139 mEq/L (ref 135–145)
Total Bilirubin: 0.6 mg/dL (ref 0.3–1.2)
Total Protein: 6.5 g/dL (ref 6.0–8.3)

## 2008-10-30 LAB — LACTATE DEHYDROGENASE: LDH: 238 U/L (ref 94–250)

## 2008-10-30 LAB — IRON AND TIBC
%SAT: 37 % (ref 20–55)
TIBC: 323 ug/dL (ref 250–470)

## 2008-11-13 ENCOUNTER — Ambulatory Visit: Payer: Self-pay | Admitting: Hematology and Oncology

## 2008-11-15 LAB — CBC WITH DIFFERENTIAL/PLATELET
Basophils Absolute: 0 10*3/uL (ref 0.0–0.1)
Eosinophils Absolute: 0.2 10*3/uL (ref 0.0–0.5)
HGB: 9.7 g/dL — ABNORMAL LOW (ref 11.6–15.9)
MCV: 84.4 fL (ref 79.5–101.0)
MONO%: 10.3 % (ref 0.0–14.0)
NEUT#: 3.3 10*3/uL (ref 1.5–6.5)
RBC: 3.52 10*6/uL — ABNORMAL LOW (ref 3.70–5.45)
RDW: 23.9 % — ABNORMAL HIGH (ref 11.2–14.5)
WBC: 5 10*3/uL (ref 3.9–10.3)
lymph#: 0.9 10*3/uL (ref 0.9–3.3)
nRBC: 0 % (ref 0–0)

## 2008-12-06 LAB — CBC WITH DIFFERENTIAL/PLATELET
Basophils Absolute: 0 10*3/uL (ref 0.0–0.1)
Eosinophils Absolute: 0.3 10*3/uL (ref 0.0–0.5)
HGB: 10.3 g/dL — ABNORMAL LOW (ref 11.6–15.9)
LYMPH%: 17.6 % (ref 14.0–49.7)
MCH: 29.5 pg (ref 25.1–34.0)
MCV: 88 fL (ref 79.5–101.0)
MONO%: 10.1 % (ref 0.0–14.0)
NEUT#: 3.6 10*3/uL (ref 1.5–6.5)
NEUT%: 67 % (ref 38.4–76.8)
Platelets: 211 10*3/uL (ref 145–400)

## 2008-12-06 LAB — COMPREHENSIVE METABOLIC PANEL
ALT: 45 U/L — ABNORMAL HIGH (ref 0–35)
CO2: 26 mEq/L (ref 19–32)
Calcium: 9.2 mg/dL (ref 8.4–10.5)
Chloride: 107 mEq/L (ref 96–112)
Creatinine, Ser: 0.71 mg/dL (ref 0.40–1.20)
Glucose, Bld: 187 mg/dL — ABNORMAL HIGH (ref 70–99)
Sodium: 143 mEq/L (ref 135–145)
Total Protein: 6.6 g/dL (ref 6.0–8.3)

## 2008-12-06 LAB — IRON AND TIBC: Iron: 67 ug/dL (ref 42–145)

## 2008-12-06 LAB — LACTATE DEHYDROGENASE: LDH: 348 U/L — ABNORMAL HIGH (ref 94–250)

## 2009-01-01 ENCOUNTER — Ambulatory Visit: Payer: Self-pay | Admitting: Oncology

## 2009-01-07 LAB — CBC WITH DIFFERENTIAL/PLATELET
BASO%: 0.6 % (ref 0.0–2.0)
Basophils Absolute: 0 10*3/uL (ref 0.0–0.1)
HCT: 31.9 % — ABNORMAL LOW (ref 34.8–46.6)
HGB: 10.9 g/dL — ABNORMAL LOW (ref 11.6–15.9)
LYMPH%: 16.4 % (ref 14.0–49.7)
MCHC: 34.2 g/dL (ref 31.5–36.0)
MONO#: 0.5 10*3/uL (ref 0.1–0.9)
NEUT%: 69.5 % (ref 38.4–76.8)
Platelets: 214 10*3/uL (ref 145–400)
WBC: 5.1 10*3/uL (ref 3.9–10.3)
lymph#: 0.8 10*3/uL — ABNORMAL LOW (ref 0.9–3.3)

## 2009-01-07 LAB — COMPREHENSIVE METABOLIC PANEL
Albumin: 3.9 g/dL (ref 3.5–5.2)
BUN: 12 mg/dL (ref 6–23)
CO2: 26 mEq/L (ref 19–32)
Calcium: 9.4 mg/dL (ref 8.4–10.5)
Chloride: 101 mEq/L (ref 96–112)
Creatinine, Ser: 0.77 mg/dL (ref 0.40–1.20)
Glucose, Bld: 384 mg/dL — ABNORMAL HIGH (ref 70–99)
Potassium: 3.8 mEq/L (ref 3.5–5.3)

## 2009-01-07 LAB — FERRITIN: Ferritin: 43 ng/mL (ref 10–291)

## 2009-01-07 LAB — IRON AND TIBC
Iron: 45 ug/dL (ref 42–145)
UIBC: 260 ug/dL

## 2009-01-07 LAB — LACTATE DEHYDROGENASE: LDH: 218 U/L (ref 94–250)

## 2009-01-18 ENCOUNTER — Emergency Department (HOSPITAL_COMMUNITY): Admission: EM | Admit: 2009-01-18 | Discharge: 2009-01-18 | Payer: Self-pay | Admitting: Family Medicine

## 2009-01-19 ENCOUNTER — Inpatient Hospital Stay (HOSPITAL_COMMUNITY): Admission: EM | Admit: 2009-01-19 | Discharge: 2009-01-19 | Payer: Self-pay | Admitting: Emergency Medicine

## 2009-01-22 LAB — CBC WITH DIFFERENTIAL/PLATELET
Basophils Absolute: 0 10*3/uL (ref 0.0–0.1)
Eosinophils Absolute: 0.1 10*3/uL (ref 0.0–0.5)
HCT: 26.3 % — ABNORMAL LOW (ref 34.8–46.6)
HGB: 9 g/dL — ABNORMAL LOW (ref 11.6–15.9)
LYMPH%: 6.8 % — ABNORMAL LOW (ref 14.0–49.7)
MCV: 94.2 fL (ref 79.5–101.0)
MONO#: 0.4 10*3/uL (ref 0.1–0.9)
MONO%: 6 % (ref 0.0–14.0)
NEUT#: 5.3 10*3/uL (ref 1.5–6.5)
NEUT%: 85.4 % — ABNORMAL HIGH (ref 38.4–76.8)
Platelets: 193 10*3/uL (ref 145–400)
RBC: 2.79 10*6/uL — ABNORMAL LOW (ref 3.70–5.45)
WBC: 6.2 10*3/uL (ref 3.9–10.3)

## 2009-01-22 LAB — FERRITIN: Ferritin: 116 ng/mL (ref 10–291)

## 2009-01-22 LAB — IRON AND TIBC
%SAT: 16 % — ABNORMAL LOW (ref 20–55)
TIBC: 256 ug/dL (ref 250–470)

## 2009-01-30 ENCOUNTER — Ambulatory Visit: Payer: Self-pay | Admitting: Oncology

## 2009-02-04 LAB — CBC WITH DIFFERENTIAL/PLATELET
BASO%: 0.3 % (ref 0.0–2.0)
Basophils Absolute: 0 10*3/uL (ref 0.0–0.1)
EOS%: 5.4 % (ref 0.0–7.0)
HCT: 28 % — ABNORMAL LOW (ref 34.8–46.6)
HGB: 9.4 g/dL — ABNORMAL LOW (ref 11.6–15.9)
MCH: 31.8 pg (ref 25.1–34.0)
MONO#: 0.4 10*3/uL (ref 0.1–0.9)
NEUT%: 70.1 % (ref 38.4–76.8)
RDW: 16.5 % — ABNORMAL HIGH (ref 11.2–14.5)
WBC: 3.7 10*3/uL — ABNORMAL LOW (ref 3.9–10.3)
lymph#: 0.5 10*3/uL — ABNORMAL LOW (ref 0.9–3.3)

## 2009-02-04 LAB — COMPREHENSIVE METABOLIC PANEL
ALT: 27 U/L (ref 0–35)
AST: 25 U/L (ref 0–37)
Albumin: 3.6 g/dL (ref 3.5–5.2)
Alkaline Phosphatase: 110 U/L (ref 39–117)
Calcium: 9.5 mg/dL (ref 8.4–10.5)
Chloride: 106 mEq/L (ref 96–112)
Potassium: 3.9 mEq/L (ref 3.5–5.3)
Sodium: 141 mEq/L (ref 135–145)
Total Protein: 5.7 g/dL — ABNORMAL LOW (ref 6.0–8.3)

## 2009-02-04 LAB — IRON AND TIBC: TIBC: 261 ug/dL (ref 250–470)

## 2009-02-05 LAB — URINE, BLOOD: Blood: NEGATIVE

## 2009-02-13 LAB — HEMOSIDERIN, URINE

## 2009-02-28 ENCOUNTER — Ambulatory Visit: Payer: Self-pay | Admitting: Oncology

## 2009-03-04 LAB — COMPREHENSIVE METABOLIC PANEL
Albumin: 3.9 g/dL (ref 3.5–5.2)
BUN: 14 mg/dL (ref 6–23)
CO2: 23 mEq/L (ref 19–32)
Calcium: 8.9 mg/dL (ref 8.4–10.5)
Chloride: 106 mEq/L (ref 96–112)
Creatinine, Ser: 0.73 mg/dL (ref 0.40–1.20)
Glucose, Bld: 186 mg/dL — ABNORMAL HIGH (ref 70–99)
Potassium: 3.5 mEq/L (ref 3.5–5.3)
Sodium: 142 mEq/L (ref 135–145)

## 2009-03-04 LAB — CBC WITH DIFFERENTIAL/PLATELET
BASO%: 0.1 % (ref 0.0–2.0)
Basophils Absolute: 0 10*3/uL (ref 0.0–0.1)
EOS%: 5.7 % (ref 0.0–7.0)
Eosinophils Absolute: 0.2 10*3/uL (ref 0.0–0.5)
HCT: 28.1 % — ABNORMAL LOW (ref 34.8–46.6)
HGB: 9.6 g/dL — ABNORMAL LOW (ref 11.6–15.9)
LYMPH%: 15.4 % (ref 14.0–49.7)
MCH: 30.4 pg (ref 25.1–34.0)
MCHC: 34.1 g/dL (ref 31.5–36.0)
MCV: 89.1 fL (ref 79.5–101.0)
MONO#: 0.4 10*3/uL (ref 0.1–0.9)
MONO%: 9.6 % (ref 0.0–14.0)
NEUT#: 3 10*3/uL (ref 1.5–6.5)
NEUT%: 69.2 % (ref 38.4–76.8)
Platelets: 224 10*3/uL (ref 145–400)
RBC: 3.15 10*6/uL — ABNORMAL LOW (ref 3.70–5.45)
RDW: 15.9 % — ABNORMAL HIGH (ref 11.2–14.5)
WBC: 4.3 10*3/uL (ref 3.9–10.3)
lymph#: 0.7 10*3/uL — ABNORMAL LOW (ref 0.9–3.3)

## 2009-03-04 LAB — LACTATE DEHYDROGENASE: LDH: 209 U/L (ref 94–250)

## 2009-03-04 LAB — FERRITIN: Ferritin: 31 ng/mL (ref 10–291)

## 2009-04-24 ENCOUNTER — Ambulatory Visit: Payer: Self-pay | Admitting: Oncology

## 2009-04-26 LAB — CBC WITH DIFFERENTIAL/PLATELET
Basophils Absolute: 0 10*3/uL (ref 0.0–0.1)
Eosinophils Absolute: 0.3 10*3/uL (ref 0.0–0.5)
HGB: 9.6 g/dL — ABNORMAL LOW (ref 11.6–15.9)
MCV: 86.5 fL (ref 79.5–101.0)
MONO#: 0.4 10*3/uL (ref 0.1–0.9)
MONO%: 10.3 % (ref 0.0–14.0)
NEUT#: 2.5 10*3/uL (ref 1.5–6.5)
RBC: 3.34 10*6/uL — ABNORMAL LOW (ref 3.70–5.45)
RDW: 14.6 % — ABNORMAL HIGH (ref 11.2–14.5)
WBC: 4.2 10*3/uL (ref 3.9–10.3)
lymph#: 0.9 10*3/uL (ref 0.9–3.3)

## 2009-04-26 LAB — BASIC METABOLIC PANEL
BUN: 13 mg/dL (ref 6–23)
Chloride: 105 mEq/L (ref 96–112)
Glucose, Bld: 316 mg/dL — ABNORMAL HIGH (ref 70–99)
Potassium: 3.9 mEq/L (ref 3.5–5.3)
Sodium: 140 mEq/L (ref 135–145)

## 2009-04-26 LAB — IRON AND TIBC
%SAT: 14 % — ABNORMAL LOW (ref 20–55)
TIBC: 270 ug/dL (ref 250–470)

## 2009-04-26 LAB — FERRITIN: Ferritin: 29 ng/mL (ref 10–291)

## 2009-06-05 ENCOUNTER — Encounter: Admission: RE | Admit: 2009-06-05 | Discharge: 2009-06-05 | Payer: Self-pay | Admitting: Internal Medicine

## 2009-07-01 ENCOUNTER — Ambulatory Visit: Payer: Self-pay | Admitting: Oncology

## 2009-07-04 LAB — CBC WITH DIFFERENTIAL/PLATELET
BASO%: 0.1 % (ref 0.0–2.0)
EOS%: 4.6 % (ref 0.0–7.0)
HCT: 21.8 % — ABNORMAL LOW (ref 34.8–46.6)
LYMPH%: 14.3 % (ref 14.0–49.7)
MCH: 29.7 pg (ref 25.1–34.0)
MCHC: 34.3 g/dL (ref 31.5–36.0)
MONO#: 0.5 10*3/uL (ref 0.1–0.9)
NEUT#: 3.4 10*3/uL (ref 1.5–6.5)
NEUT%: 71.5 % (ref 38.4–76.8)
Platelets: 206 10*3/uL (ref 145–400)
RBC: 2.51 10*6/uL — ABNORMAL LOW (ref 3.70–5.45)
WBC: 4.8 10*3/uL (ref 3.9–10.3)

## 2009-07-04 LAB — COMPREHENSIVE METABOLIC PANEL
ALT: 27 U/L (ref 0–35)
AST: 34 U/L (ref 0–37)
Alkaline Phosphatase: 94 U/L (ref 39–117)
CO2: 26 mEq/L (ref 19–32)
Calcium: 9 mg/dL (ref 8.4–10.5)
Creatinine, Ser: 0.78 mg/dL (ref 0.40–1.20)
Sodium: 139 mEq/L (ref 135–145)
Total Bilirubin: 0.4 mg/dL (ref 0.3–1.2)
Total Protein: 5.5 g/dL — ABNORMAL LOW (ref 6.0–8.3)

## 2009-07-04 LAB — IRON AND TIBC
%SAT: 9 % — ABNORMAL LOW (ref 20–55)
TIBC: 291 ug/dL (ref 250–470)

## 2009-07-04 LAB — FERRITIN: Ferritin: 18 ng/mL (ref 10–291)

## 2009-07-05 ENCOUNTER — Encounter (HOSPITAL_COMMUNITY): Admission: RE | Admit: 2009-07-05 | Discharge: 2009-07-05 | Payer: Self-pay | Admitting: Hematology and Oncology

## 2009-07-05 LAB — TYPE & CROSSMATCH - CHCC

## 2009-07-31 ENCOUNTER — Observation Stay (HOSPITAL_COMMUNITY): Admission: EM | Admit: 2009-07-31 | Discharge: 2009-08-02 | Payer: Self-pay | Admitting: Emergency Medicine

## 2009-07-31 ENCOUNTER — Ambulatory Visit: Payer: Self-pay | Admitting: Oncology

## 2009-07-31 LAB — CBC WITH DIFFERENTIAL/PLATELET
BASO%: 0.2 % (ref 0.0–2.0)
EOS%: 5.2 % (ref 0.0–7.0)
HCT: 22.7 % — ABNORMAL LOW (ref 34.8–46.6)
LYMPH%: 15.4 % (ref 14.0–49.7)
MCH: 26.4 pg (ref 25.1–34.0)
MCHC: 32.8 g/dL (ref 31.5–36.0)
NEUT%: 70.1 % (ref 38.4–76.8)
lymph#: 0.8 10*3/uL — ABNORMAL LOW (ref 0.9–3.3)

## 2009-07-31 LAB — COMPREHENSIVE METABOLIC PANEL
ALT: 12 U/L (ref 0–35)
AST: 18 U/L (ref 0–37)
Alkaline Phosphatase: 93 U/L (ref 39–117)
Chloride: 107 mEq/L (ref 96–112)
Creatinine, Ser: 0.77 mg/dL (ref 0.40–1.20)
Total Bilirubin: 0.4 mg/dL (ref 0.3–1.2)

## 2009-07-31 LAB — IRON AND TIBC
Iron: 15 ug/dL — ABNORMAL LOW (ref 42–145)
UIBC: 310 ug/dL

## 2009-08-01 ENCOUNTER — Ambulatory Visit: Payer: Self-pay | Admitting: Hematology and Oncology

## 2009-08-07 LAB — CBC WITH DIFFERENTIAL/PLATELET
BASO%: 0.2 % (ref 0.0–2.0)
Eosinophils Absolute: 0.3 10*3/uL (ref 0.0–0.5)
HCT: 29.8 % — ABNORMAL LOW (ref 34.8–46.6)
LYMPH%: 13.6 % — ABNORMAL LOW (ref 14.0–49.7)
MCHC: 34.3 g/dL (ref 31.5–36.0)
MCV: 84.8 fL (ref 79.5–101.0)
MONO%: 9.1 % (ref 0.0–14.0)
NEUT%: 71.6 % (ref 38.4–76.8)
Platelets: 174 10*3/uL (ref 145–400)
RBC: 3.52 10*6/uL — ABNORMAL LOW (ref 3.70–5.45)

## 2009-10-08 ENCOUNTER — Ambulatory Visit: Payer: Self-pay | Admitting: Oncology

## 2009-10-08 ENCOUNTER — Encounter (HOSPITAL_COMMUNITY): Admission: RE | Admit: 2009-10-08 | Discharge: 2009-11-06 | Payer: Self-pay | Admitting: Hematology and Oncology

## 2009-10-08 LAB — CBC WITH DIFFERENTIAL/PLATELET
BASO%: 0.5 % (ref 0.0–2.0)
LYMPH%: 16.4 % (ref 14.0–49.7)
MCHC: 30.9 g/dL — ABNORMAL LOW (ref 31.5–36.0)
MONO#: 0.4 10*3/uL (ref 0.1–0.9)
RBC: 2.66 10*6/uL — ABNORMAL LOW (ref 3.70–5.45)
RDW: 17.4 % — ABNORMAL HIGH (ref 11.2–14.5)
WBC: 4.3 10*3/uL (ref 3.9–10.3)
lymph#: 0.7 10*3/uL — ABNORMAL LOW (ref 0.9–3.3)

## 2009-10-08 LAB — COMPREHENSIVE METABOLIC PANEL
ALT: 22 U/L (ref 0–35)
CO2: 25 mEq/L (ref 19–32)
Chloride: 105 mEq/L (ref 96–112)
Potassium: 3.9 mEq/L (ref 3.5–5.3)
Sodium: 139 mEq/L (ref 135–145)
Total Bilirubin: 0.4 mg/dL (ref 0.3–1.2)
Total Protein: 5.6 g/dL — ABNORMAL LOW (ref 6.0–8.3)

## 2009-10-08 LAB — TYPE & CROSSMATCH - CHCC

## 2009-10-08 LAB — HOLD TUBE, BLOOD BANK

## 2009-10-28 LAB — BASIC METABOLIC PANEL
BUN: 13 mg/dL (ref 6–23)
CO2: 29 mEq/L (ref 19–32)
Glucose, Bld: 219 mg/dL — ABNORMAL HIGH (ref 70–99)
Potassium: 4.3 mEq/L (ref 3.5–5.3)
Sodium: 144 mEq/L (ref 135–145)

## 2009-10-28 LAB — CBC WITH DIFFERENTIAL/PLATELET
BASO%: 0.5 % (ref 0.0–2.0)
EOS%: 4.8 % (ref 0.0–7.0)
HGB: 7.3 g/dL — ABNORMAL LOW (ref 11.6–15.9)
MCH: 22.3 pg — ABNORMAL LOW (ref 25.1–34.0)
MCHC: 29.3 g/dL — ABNORMAL LOW (ref 31.5–36.0)
MCV: 75.9 fL — ABNORMAL LOW (ref 79.5–101.0)
MONO%: 11.3 % (ref 0.0–14.0)
RBC: 3.28 10*6/uL — ABNORMAL LOW (ref 3.70–5.45)
RDW: 18 % — ABNORMAL HIGH (ref 11.2–14.5)
lymph#: 0.8 10*3/uL — ABNORMAL LOW (ref 0.9–3.3)
nRBC: 0 % (ref 0–0)

## 2009-10-28 LAB — IRON AND TIBC
%SAT: 4 % — ABNORMAL LOW (ref 20–55)
TIBC: 322 ug/dL (ref 250–470)

## 2009-10-29 LAB — TYPE & CROSSMATCH - CHCC

## 2009-12-30 ENCOUNTER — Ambulatory Visit: Payer: Self-pay | Admitting: Hematology and Oncology

## 2009-12-31 LAB — CBC WITH DIFFERENTIAL/PLATELET
BASO%: 0.2 % (ref 0.0–2.0)
Eosinophils Absolute: 0.3 10*3/uL (ref 0.0–0.5)
LYMPH%: 21.9 % (ref 14.0–49.7)
MCHC: 33.2 g/dL (ref 31.5–36.0)
MONO#: 0.6 10*3/uL (ref 0.1–0.9)
NEUT#: 2.6 10*3/uL (ref 1.5–6.5)
Platelets: 203 10*3/uL (ref 145–400)
RBC: 3.79 10*6/uL (ref 3.70–5.45)
RDW: 16 % — ABNORMAL HIGH (ref 11.2–14.5)
WBC: 4.3 10*3/uL (ref 3.9–10.3)
lymph#: 1 10*3/uL (ref 0.9–3.3)
nRBC: 0 % (ref 0–0)

## 2009-12-31 LAB — BASIC METABOLIC PANEL
CO2: 24 mEq/L (ref 19–32)
Chloride: 108 mEq/L (ref 96–112)
Creatinine, Ser: 0.79 mg/dL (ref 0.40–1.20)
Potassium: 3.7 mEq/L (ref 3.5–5.3)
Sodium: 141 mEq/L (ref 135–145)

## 2009-12-31 LAB — IRON AND TIBC
%SAT: 16 % — ABNORMAL LOW (ref 20–55)
TIBC: 296 ug/dL (ref 250–470)
UIBC: 250 ug/dL

## 2010-03-02 ENCOUNTER — Encounter: Payer: Self-pay | Admitting: Sports Medicine

## 2010-03-28 ENCOUNTER — Other Ambulatory Visit: Payer: Self-pay | Admitting: Hematology and Oncology

## 2010-03-28 ENCOUNTER — Encounter (HOSPITAL_BASED_OUTPATIENT_CLINIC_OR_DEPARTMENT_OTHER): Payer: MEDICARE | Admitting: Hematology and Oncology

## 2010-03-28 ENCOUNTER — Encounter (HOSPITAL_COMMUNITY)
Admission: RE | Admit: 2010-03-28 | Discharge: 2010-03-28 | Disposition: A | Discharge status: Home / Self Care | Payer: Medicare Other | Source: Ambulatory Visit | Attending: Hematology and Oncology | Admitting: Hematology and Oncology

## 2010-03-28 DIAGNOSIS — D509 Iron deficiency anemia, unspecified: Secondary | ICD-10-CM

## 2010-03-28 DIAGNOSIS — D500 Iron deficiency anemia secondary to blood loss (chronic): Secondary | ICD-10-CM | POA: Insufficient documentation

## 2010-03-28 DIAGNOSIS — I780 Hereditary hemorrhagic telangiectasia: Secondary | ICD-10-CM | POA: Insufficient documentation

## 2010-03-28 DIAGNOSIS — R040 Epistaxis: Secondary | ICD-10-CM | POA: Insufficient documentation

## 2010-03-28 DIAGNOSIS — D649 Anemia, unspecified: Secondary | ICD-10-CM

## 2010-03-28 LAB — BASIC METABOLIC PANEL
Calcium: 9 mg/dL (ref 8.4–10.5)
Glucose, Bld: 194 mg/dL — ABNORMAL HIGH (ref 70–99)
Potassium: 3.6 mEq/L (ref 3.5–5.3)
Sodium: 143 mEq/L (ref 135–145)

## 2010-03-28 LAB — IRON AND TIBC
%SAT: 4 % — ABNORMAL LOW (ref 20–55)
Iron: 12 ug/dL — ABNORMAL LOW (ref 42–145)
TIBC: 317 ug/dL (ref 250–470)
UIBC: 305 ug/dL

## 2010-03-28 LAB — CBC WITH DIFFERENTIAL/PLATELET
Basophils Absolute: 0 10*3/uL (ref 0.0–0.1)
EOS%: 4.9 % (ref 0.0–7.0)
Eosinophils Absolute: 0.2 10*3/uL (ref 0.0–0.5)
HCT: 21.2 % — ABNORMAL LOW (ref 34.8–46.6)
HGB: 6.7 g/dL — CL (ref 11.6–15.9)
MCH: 22.8 pg — ABNORMAL LOW (ref 25.1–34.0)
MCV: 72.2 fL — ABNORMAL LOW (ref 79.5–101.0)
MONO%: 10.4 % (ref 0.0–14.0)
NEUT%: 74 % (ref 38.4–76.8)

## 2010-03-28 LAB — FERRITIN: Ferritin: 3 ng/mL — ABNORMAL LOW (ref 10–291)

## 2010-03-29 ENCOUNTER — Encounter (HOSPITAL_BASED_OUTPATIENT_CLINIC_OR_DEPARTMENT_OTHER): Payer: MEDICARE | Admitting: Hematology and Oncology

## 2010-03-29 DIAGNOSIS — D509 Iron deficiency anemia, unspecified: Secondary | ICD-10-CM

## 2010-03-30 LAB — CROSSMATCH
Antibody Screen: NEGATIVE
Unit division: 0

## 2010-04-04 ENCOUNTER — Other Ambulatory Visit: Payer: Self-pay | Admitting: Hematology and Oncology

## 2010-04-04 ENCOUNTER — Encounter (HOSPITAL_BASED_OUTPATIENT_CLINIC_OR_DEPARTMENT_OTHER): Payer: Medicare Other | Admitting: Hematology and Oncology

## 2010-04-04 DIAGNOSIS — I780 Hereditary hemorrhagic telangiectasia: Secondary | ICD-10-CM

## 2010-04-04 DIAGNOSIS — D649 Anemia, unspecified: Secondary | ICD-10-CM

## 2010-04-04 DIAGNOSIS — D509 Iron deficiency anemia, unspecified: Secondary | ICD-10-CM

## 2010-04-04 LAB — CBC WITH DIFFERENTIAL/PLATELET
BASO%: 1 % (ref 0.0–2.0)
LYMPH%: 19.8 % (ref 14.0–49.7)
MCHC: 30.3 g/dL — ABNORMAL LOW (ref 31.5–36.0)
MONO#: 0.5 10*3/uL (ref 0.1–0.9)
MONO%: 9.1 % (ref 0.0–14.0)
NEUT#: 3.3 10*3/uL (ref 1.5–6.5)
Platelets: 167 10*3/uL (ref 145–400)
RBC: 3.73 10*6/uL (ref 3.70–5.45)
RDW: 17.9 % — ABNORMAL HIGH (ref 11.2–14.5)
WBC: 5.3 10*3/uL (ref 3.9–10.3)
nRBC: 0 % (ref 0–0)

## 2010-04-24 LAB — CROSSMATCH
ABO/RH(D): A POS
Antibody Screen: NEGATIVE

## 2010-04-25 ENCOUNTER — Encounter (HOSPITAL_BASED_OUTPATIENT_CLINIC_OR_DEPARTMENT_OTHER)
Admission: RE | Admit: 2010-04-25 | Discharge: 2010-04-25 | Disposition: A | Discharge status: Home / Self Care | Payer: Medicare Other | Source: Ambulatory Visit | Attending: Otolaryngology | Admitting: Otolaryngology

## 2010-04-25 ENCOUNTER — Other Ambulatory Visit: Payer: Self-pay | Admitting: Otolaryngology

## 2010-04-25 ENCOUNTER — Ambulatory Visit
Admission: RE | Admit: 2010-04-25 | Discharge: 2010-04-25 | Disposition: A | Discharge status: Home / Self Care | Payer: Medicare Other | Source: Ambulatory Visit | Attending: Otolaryngology | Admitting: Otolaryngology

## 2010-04-25 DIAGNOSIS — Z01810 Encounter for preprocedural cardiovascular examination: Secondary | ICD-10-CM | POA: Insufficient documentation

## 2010-04-25 DIAGNOSIS — Z01812 Encounter for preprocedural laboratory examination: Secondary | ICD-10-CM | POA: Insufficient documentation

## 2010-04-25 DIAGNOSIS — Z01811 Encounter for preprocedural respiratory examination: Secondary | ICD-10-CM

## 2010-04-25 LAB — BASIC METABOLIC PANEL
GFR calc non Af Amer: >60 mL/min (ref >60)
Glucose, Bld: 384 mg/dL — ABNORMAL HIGH (ref 70–99)
Potassium: 4.5 mEq/L (ref 3.5–5.1)
Sodium: 138 mEq/L (ref 135–145)

## 2010-04-28 LAB — CROSSMATCH: Antibody Screen: NEGATIVE

## 2010-04-28 LAB — CBC
HCT: 21.6 % — ABNORMAL LOW (ref 36.0–46.0)
Hemoglobin: 9.3 g/dL — ABNORMAL LOW (ref 12.0–15.0)
MCH: 27.9 pg (ref 26.0–34.0)
MCHC: 33.2 g/dL (ref 30.0–36.0)
MCV: 81.7 fL (ref 78.0–100.0)
MCV: 84 fL (ref 78.0–100.0)
RBC: 2.65 MIL/uL — ABNORMAL LOW (ref 3.87–5.11)
WBC: 5.6 10*3/uL (ref 4.0–10.5)

## 2010-04-28 LAB — DIFFERENTIAL
Eosinophils Relative: 4 % (ref 0–5)
Lymphocytes Relative: 12 % (ref 12–46)
Lymphs Abs: 0.7 10*3/uL (ref 0.7–4.0)
Monocytes Absolute: 0.5 10*3/uL (ref 0.1–1.0)

## 2010-04-28 LAB — BASIC METABOLIC PANEL
Chloride: 105 mEq/L (ref 96–112)
GFR calc Af Amer: >60 mL/min (ref >60)
Potassium: 4 mEq/L (ref 3.5–5.1)

## 2010-04-28 LAB — POCT CARDIAC MARKERS
CKMB, poc: <1 ng/mL — ABNORMAL LOW (ref 1.0–8.0)
Troponin i, poc: <0.05 ng/mL (ref 0.00–0.09)

## 2010-04-28 LAB — GLUCOSE, CAPILLARY
Glucose-Capillary: 315 mg/dL — ABNORMAL HIGH (ref 70–99)
Glucose-Capillary: 68 mg/dL — ABNORMAL LOW (ref 70–99)
Glucose-Capillary: 83 mg/dL (ref 70–99)

## 2010-04-28 LAB — HEMOGLOBIN A1C: Hgb A1c MFr Bld: 7.9 % — ABNORMAL HIGH (ref <5.7)

## 2010-05-13 ENCOUNTER — Ambulatory Visit (HOSPITAL_BASED_OUTPATIENT_CLINIC_OR_DEPARTMENT_OTHER)
Admission: RE | Admit: 2010-05-13 | Discharge: 2010-05-13 | Disposition: A | Discharge status: Home / Self Care | Payer: Medicare Other | Source: Ambulatory Visit | Attending: Otolaryngology | Admitting: Otolaryngology

## 2010-05-13 ENCOUNTER — Other Ambulatory Visit: Payer: Self-pay | Admitting: Otolaryngology

## 2010-05-13 DIAGNOSIS — I780 Hereditary hemorrhagic telangiectasia: Secondary | ICD-10-CM | POA: Insufficient documentation

## 2010-05-13 DIAGNOSIS — Z01812 Encounter for preprocedural laboratory examination: Secondary | ICD-10-CM | POA: Insufficient documentation

## 2010-05-13 DIAGNOSIS — R040 Epistaxis: Secondary | ICD-10-CM | POA: Insufficient documentation

## 2010-05-13 LAB — GLUCOSE, CAPILLARY
Glucose-Capillary: 136 mg/dL — ABNORMAL HIGH (ref 70–99)
Glucose-Capillary: 166 mg/dL — ABNORMAL HIGH (ref 70–99)
Glucose-Capillary: 231 mg/dL — ABNORMAL HIGH (ref 70–99)
Glucose-Capillary: 372 mg/dL — ABNORMAL HIGH (ref 70–99)

## 2010-05-13 LAB — COMPREHENSIVE METABOLIC PANEL
ALT: 42 U/L — ABNORMAL HIGH (ref 0–35)
Albumin: 3.3 g/dL — ABNORMAL LOW (ref 3.5–5.2)
Alkaline Phosphatase: 90 U/L (ref 39–117)
BUN: 14 mg/dL (ref 6–23)
Calcium: 9.3 mg/dL (ref 8.4–10.5)
Potassium: 4.1 mEq/L (ref 3.5–5.1)
Sodium: 135 mEq/L (ref 135–145)
Total Protein: 6 g/dL (ref 6.0–8.3)

## 2010-05-13 LAB — SAMPLE TO BLOOD BANK

## 2010-05-13 LAB — DIFFERENTIAL
Basophils Relative: 0 % (ref 0–1)
Lymphs Abs: 0.6 10*3/uL — ABNORMAL LOW (ref 0.7–4.0)
Monocytes Relative: 10 % (ref 3–12)
Neutro Abs: 4 10*3/uL (ref 1.7–7.7)
Neutrophils Relative %: 72 % (ref 43–77)

## 2010-05-13 LAB — TROPONIN I: Troponin I: 0.02 ng/mL (ref 0.00–0.06)

## 2010-05-13 LAB — BASIC METABOLIC PANEL
BUN: 14 mg/dL (ref 6–23)
Calcium: 10.1 mg/dL (ref 8.4–10.5)
Creatinine, Ser: 0.83 mg/dL (ref 0.4–1.2)
GFR calc Af Amer: >60 mL/min (ref >60)

## 2010-05-13 LAB — PROTIME-INR: INR: 1.16 (ref 0.00–1.49)

## 2010-05-13 LAB — HEMOGLOBIN AND HEMATOCRIT, BLOOD
HCT: 27.1 % — ABNORMAL LOW (ref 36.0–46.0)
HCT: 27.9 % — ABNORMAL LOW (ref 36.0–46.0)
Hemoglobin: 9.1 g/dL — ABNORMAL LOW (ref 12.0–15.0)
Hemoglobin: 9.3 g/dL — ABNORMAL LOW (ref 12.0–15.0)
Hemoglobin: 9.6 g/dL — ABNORMAL LOW (ref 12.0–15.0)

## 2010-05-13 LAB — CROSSMATCH

## 2010-05-13 LAB — LIPID PANEL
Cholesterol: 191 mg/dL (ref 0–200)
LDL Cholesterol: 109 mg/dL — ABNORMAL HIGH (ref 0–99)
Total CHOL/HDL Ratio: 4.2 RATIO

## 2010-05-13 LAB — POCT HEMOGLOBIN-HEMACUE
Hemoglobin: 8.3 g/dL — ABNORMAL LOW (ref 12.0–15.0)
Hemoglobin: 9.5 g/dL — ABNORMAL LOW (ref 12.0–15.0)

## 2010-05-13 LAB — CK TOTAL AND CKMB (NOT AT ARMC)
CK, MB: 1.9 ng/mL (ref 0.3–4.0)
Total CK: 87 U/L (ref 7–177)

## 2010-05-13 LAB — HEPATIC FUNCTION PANEL
AST: 34 U/L (ref 0–37)
Bilirubin, Direct: <0.1 mg/dL (ref 0.0–0.3)

## 2010-05-13 LAB — CBC
Platelets: 190 10*3/uL (ref 150–400)
RBC: 3.31 MIL/uL — ABNORMAL LOW (ref 3.87–5.11)
WBC: 5.6 10*3/uL (ref 4.0–10.5)

## 2010-05-13 LAB — MAGNESIUM: Magnesium: 1.7 mg/dL (ref 1.5–2.5)

## 2010-05-16 LAB — CROSSMATCH: Antibody Screen: NEGATIVE

## 2010-05-16 LAB — ABO/RH: ABO/RH(D): A POS

## 2010-05-18 LAB — DIFFERENTIAL
Basophils Absolute: 0 10*3/uL (ref 0.0–0.1)
Basophils Relative: 0 % (ref 0–1)
Lymphocytes Relative: 17 % (ref 12–46)
Monocytes Absolute: 0.5 10*3/uL (ref 0.1–1.0)
Monocytes Relative: 11 % (ref 3–12)
Neutro Abs: 2.9 10*3/uL (ref 1.7–7.7)
Neutrophils Relative %: 68 % (ref 43–77)

## 2010-05-18 LAB — CBC
Hemoglobin: 8.4 g/dL — ABNORMAL LOW (ref 12.0–15.0)
MCHC: 32.5 g/dL (ref 30.0–36.0)
RBC: 3.08 MIL/uL — ABNORMAL LOW (ref 3.87–5.11)
WBC: 4.3 10*3/uL (ref 4.0–10.5)

## 2010-05-18 LAB — URINALYSIS, ROUTINE W REFLEX MICROSCOPIC
Glucose, UA: >1000 mg/dL — AB
Hgb urine dipstick: NEGATIVE
Leukocytes, UA: NEGATIVE
pH: 6.5 (ref 5.0–8.0)

## 2010-05-18 LAB — URINE MICROSCOPIC-ADD ON

## 2010-05-18 LAB — POCT URINALYSIS DIP (DEVICE)
Glucose, UA: 500 mg/dL — AB
Hgb urine dipstick: NEGATIVE
Ketones, ur: NEGATIVE mg/dL
Specific Gravity, Urine: 1.015 (ref 1.005–1.030)

## 2010-05-18 LAB — POCT I-STAT, CHEM 8
BUN: 17 mg/dL (ref 6–23)
Calcium, Ion: 1.21 mmol/L (ref 1.12–1.32)
Chloride: 105 mEq/L (ref 96–112)
Glucose, Bld: 311 mg/dL — ABNORMAL HIGH (ref 70–99)
HCT: 26 % — ABNORMAL LOW (ref 36.0–46.0)
Potassium: 4.1 mEq/L (ref 3.5–5.1)

## 2010-05-18 LAB — POCT CARDIAC MARKERS
CKMB, poc: <1 ng/mL — ABNORMAL LOW (ref 1.0–8.0)
Myoglobin, poc: 71.3 ng/mL (ref 12–200)

## 2010-05-18 LAB — HEMOCCULT GUIAC POC 1CARD (OFFICE): Fecal Occult Bld: POSITIVE

## 2010-05-21 LAB — CBC
HCT: 26.4 % — ABNORMAL LOW (ref 36.0–46.0)
Hemoglobin: 5.7 g/dL — CL (ref 12.0–15.0)
Hemoglobin: 8.7 g/dL — ABNORMAL LOW (ref 12.0–15.0)
Hemoglobin: 8.9 g/dL — ABNORMAL LOW (ref 12.0–15.0)
Hemoglobin: 9.2 g/dL — ABNORMAL LOW (ref 12.0–15.0)
MCHC: 30.5 g/dL (ref 30.0–36.0)
MCHC: 32 g/dL (ref 30.0–36.0)
MCHC: 33.2 g/dL (ref 30.0–36.0)
MCHC: 33.8 g/dL (ref 30.0–36.0)
MCV: 80.1 fL (ref 78.0–100.0)
MCV: 81.1 fL (ref 78.0–100.0)
Platelets: 218 10*3/uL (ref 150–400)
RBC: 3.25 MIL/uL — ABNORMAL LOW (ref 3.87–5.11)
RBC: 3.35 MIL/uL — ABNORMAL LOW (ref 3.87–5.11)
RBC: 3.61 MIL/uL — ABNORMAL LOW (ref 3.87–5.11)
RDW: 18.8 % — ABNORMAL HIGH (ref 11.5–15.5)
RDW: 19 % — ABNORMAL HIGH (ref 11.5–15.5)
RDW: 19.1 % — ABNORMAL HIGH (ref 11.5–15.5)
RDW: 20.8 % — ABNORMAL HIGH (ref 11.5–15.5)
WBC: 6.6 10*3/uL (ref 4.0–10.5)

## 2010-05-21 LAB — CROSSMATCH

## 2010-05-21 LAB — GLUCOSE, CAPILLARY
Glucose-Capillary: 121 mg/dL — ABNORMAL HIGH (ref 70–99)
Glucose-Capillary: 139 mg/dL — ABNORMAL HIGH (ref 70–99)
Glucose-Capillary: 156 mg/dL — ABNORMAL HIGH (ref 70–99)
Glucose-Capillary: 158 mg/dL — ABNORMAL HIGH (ref 70–99)
Glucose-Capillary: 175 mg/dL — ABNORMAL HIGH (ref 70–99)

## 2010-05-21 LAB — HEMOGLOBIN AND HEMATOCRIT, BLOOD
HCT: 22 % — ABNORMAL LOW (ref 36.0–46.0)
HCT: 22.5 % — ABNORMAL LOW (ref 36.0–46.0)
Hemoglobin: 7.4 g/dL — CL (ref 12.0–15.0)

## 2010-05-21 LAB — APTT: aPTT: 30 seconds (ref 24–37)

## 2010-05-21 LAB — HEMOCCULT GUIAC POC 1CARD (OFFICE): Fecal Occult Bld: POSITIVE

## 2010-05-21 LAB — DIFFERENTIAL
Basophils Absolute: 0 10*3/uL (ref 0.0–0.1)
Basophils Relative: 0 % (ref 0–1)
Eosinophils Absolute: 0.2 10*3/uL (ref 0.0–0.7)
Monocytes Absolute: 0.6 10*3/uL (ref 0.1–1.0)
Neutro Abs: 4.7 10*3/uL (ref 1.7–7.7)
Neutrophils Relative %: 76 % (ref 43–77)

## 2010-05-21 LAB — POCT I-STAT, CHEM 8
Calcium, Ion: 1.13 mmol/L (ref 1.12–1.32)
Glucose, Bld: 282 mg/dL — ABNORMAL HIGH (ref 70–99)
HCT: 18 % — ABNORMAL LOW (ref 36.0–46.0)
Hemoglobin: 6.1 g/dL — CL (ref 12.0–15.0)

## 2010-05-22 LAB — POCT I-STAT, CHEM 8
Calcium, Ion: 1.26 mmol/L (ref 1.12–1.32)
Chloride: 105 mEq/L (ref 96–112)
Glucose, Bld: 206 mg/dL — ABNORMAL HIGH (ref 70–99)
HCT: 24 % — ABNORMAL LOW (ref 36.0–46.0)
Hemoglobin: 8.2 g/dL — ABNORMAL LOW (ref 12.0–15.0)

## 2010-05-22 LAB — BASIC METABOLIC PANEL
BUN: 6 mg/dL (ref 6–23)
Calcium: 9.1 mg/dL (ref 8.4–10.5)
Creatinine, Ser: 0.73 mg/dL (ref 0.4–1.2)
GFR calc Af Amer: >60 mL/min (ref >60)

## 2010-05-22 LAB — CBC
HCT: 20.7 % — ABNORMAL LOW (ref 36.0–46.0)
MCHC: 33.4 g/dL (ref 30.0–36.0)
Platelets: 225 10*3/uL (ref 150–400)
Platelets: 282 10*3/uL (ref 150–400)
RBC: 3.17 MIL/uL — ABNORMAL LOW (ref 3.87–5.11)
RDW: 19.9 % — ABNORMAL HIGH (ref 11.5–15.5)
WBC: 4.7 10*3/uL (ref 4.0–10.5)

## 2010-05-22 LAB — DIFFERENTIAL
Basophils Relative: 3 % — ABNORMAL HIGH (ref 0–1)
Eosinophils Relative: 5 % (ref 0–5)
Lymphocytes Relative: 14 % (ref 12–46)
Monocytes Absolute: 0.5 10*3/uL (ref 0.1–1.0)
Monocytes Relative: 10 % (ref 3–12)
Neutrophils Relative %: 68 % (ref 43–77)

## 2010-05-22 LAB — CROSSMATCH: Antibody Screen: NEGATIVE

## 2010-05-22 LAB — COMPREHENSIVE METABOLIC PANEL
Albumin: 3.5 g/dL (ref 3.5–5.2)
Alkaline Phosphatase: 90 U/L (ref 39–117)
BUN: 5 mg/dL — ABNORMAL LOW (ref 6–23)
Calcium: 9.2 mg/dL (ref 8.4–10.5)
Potassium: 3.8 mEq/L (ref 3.5–5.1)
Total Protein: 6 g/dL (ref 6.0–8.3)

## 2010-05-22 LAB — GLUCOSE, CAPILLARY
Glucose-Capillary: 125 mg/dL — ABNORMAL HIGH (ref 70–99)
Glucose-Capillary: 55 mg/dL — ABNORMAL LOW (ref 70–99)

## 2010-05-22 LAB — HEMOCCULT GUIAC POC 1CARD (OFFICE): Fecal Occult Bld: POSITIVE

## 2010-05-27 LAB — COMPREHENSIVE METABOLIC PANEL
AST: 55 U/L — ABNORMAL HIGH (ref 0–37)
Albumin: 3.9 g/dL (ref 3.5–5.2)
BUN: 19 mg/dL (ref 6–23)
Calcium: 9.8 mg/dL (ref 8.4–10.5)
Chloride: 108 mEq/L (ref 96–112)
Creatinine, Ser: 0.99 mg/dL (ref 0.4–1.2)
GFR calc Af Amer: >60 mL/min (ref >60)
Total Protein: 6.9 g/dL (ref 6.0–8.3)

## 2010-05-27 LAB — CBC
HCT: 31.1 % — ABNORMAL LOW (ref 36.0–46.0)
MCV: 81.3 fL (ref 78.0–100.0)
Platelets: 221 10*3/uL (ref 150–400)
RDW: 19.5 % — ABNORMAL HIGH (ref 11.5–15.5)
WBC: 10.1 10*3/uL (ref 4.0–10.5)

## 2010-05-27 LAB — URINALYSIS, ROUTINE W REFLEX MICROSCOPIC
Glucose, UA: 100 mg/dL — AB
Hgb urine dipstick: NEGATIVE
Ketones, ur: 15 mg/dL — AB
Protein, ur: NEGATIVE mg/dL
Urobilinogen, UA: 0.2 mg/dL (ref 0.0–1.0)

## 2010-05-27 LAB — DIFFERENTIAL
Basophils Absolute: 0 10*3/uL (ref 0.0–0.1)
Eosinophils Relative: 0 % (ref 0–5)
Lymphocytes Relative: 1 % — ABNORMAL LOW (ref 12–46)
Lymphs Abs: 0.1 10*3/uL — ABNORMAL LOW (ref 0.7–4.0)
Monocytes Absolute: 0.2 10*3/uL (ref 0.1–1.0)
Monocytes Relative: 2 % — ABNORMAL LOW (ref 3–12)
Neutro Abs: 9.7 10*3/uL — ABNORMAL HIGH (ref 1.7–7.7)

## 2010-06-03 ENCOUNTER — Other Ambulatory Visit: Payer: Self-pay | Admitting: Hematology and Oncology

## 2010-06-03 ENCOUNTER — Encounter (HOSPITAL_BASED_OUTPATIENT_CLINIC_OR_DEPARTMENT_OTHER): Payer: Medicare Other | Admitting: Hematology and Oncology

## 2010-06-03 DIAGNOSIS — D649 Anemia, unspecified: Secondary | ICD-10-CM

## 2010-06-03 DIAGNOSIS — I780 Hereditary hemorrhagic telangiectasia: Secondary | ICD-10-CM

## 2010-06-03 DIAGNOSIS — D509 Iron deficiency anemia, unspecified: Secondary | ICD-10-CM

## 2010-06-03 LAB — BASIC METABOLIC PANEL
BUN: 18 mg/dL (ref 6–23)
Chloride: 104 mEq/L (ref 96–112)
Creatinine, Ser: 0.77 mg/dL (ref 0.40–1.20)
Glucose, Bld: 276 mg/dL — ABNORMAL HIGH (ref 70–99)
Potassium: 3.7 mEq/L (ref 3.5–5.3)

## 2010-06-03 LAB — CBC WITH DIFFERENTIAL/PLATELET
Basophils Absolute: 0 10*3/uL (ref 0.0–0.1)
Eosinophils Absolute: 0.4 10*3/uL (ref 0.0–0.5)
HCT: 26.5 % — ABNORMAL LOW (ref 34.8–46.6)
HGB: 8.7 g/dL — ABNORMAL LOW (ref 11.6–15.9)
MCH: 26.4 pg (ref 25.1–34.0)
MCV: 80.8 fL (ref 79.5–101.0)
MONO%: 8.7 % (ref 0.0–14.0)
NEUT#: 3.6 10*3/uL (ref 1.5–6.5)
NEUT%: 68.5 % (ref 38.4–76.8)
RDW: 19.8 % — ABNORMAL HIGH (ref 11.2–14.5)

## 2010-06-03 LAB — FERRITIN: Ferritin: 12 ng/mL (ref 10–291)

## 2010-06-03 LAB — IRON AND TIBC: %SAT: 6 % — ABNORMAL LOW (ref 20–55)

## 2010-06-04 NOTE — Op Note (Signed)
[name]                ACCOUNT NO.:  [id number]  MEDICAL RECORD NO.:  [id number]           PATIENT TYPE:  O  LOCATION:  OMED                         FACILITY:  [facility]  PHYSICIAN:  Leonides Sake. 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  OPERATION PERFORMED:  Full-thickness skin graft to left-sided septum.  SURGEON:  Leonides Sake. Lucia Gaskins, MD  ANESTHESIA:  General endotracheal.  COMPLICATIONS:  None.  BRIEF CLINICAL NOTE:  [name] is a 70 year old female with history of Osler-Weber-Rendu syndrome with frequent nosebleeds.  She has had previous dermatoplasty to the right side of the septum a [place] over a year ago and she has done well concerning bleeding on the right side. She now presents for skin graft to the left side as she has continued to have her more frequent nosebleeds on the left side, although she occasionally has bleeding on the right side.  DESCRIPTION OF PROCEDURE:  After adequate endotracheal anesthesia, the patient received 1 gram Ancef IV preoperatively.  Nose was prepped with cotton pledgets soaked in Afrin for decongesting the nose.  The neck on the left side was prepped with Betadine solution and draped with sterile towels.  First, a full-thickness skin graft was obtained from the left supraclavicular area measuring approximately 3-4 cm in length and 2.5- 3.5 cm in width.  After obtaining the full-thickness skin graft, the donor site was closed with 4-0 chromic sutures subcutaneously and 6-0 nylon to reapproximate skin edges.  Bacitracin ointment and  sterile dressing was applied.  The graft was prepared and set aside in saline. Next, the nose was examined.  She had a previous skin graft on the right side and this was taken well.  She had a Rhinehart bit of bleeding just posterior and distal to the skin graft from the mucosa.  Distal area was cauterized.  On the left side,  she had kind of diffuse bleeding of the anterior septum, and more posteriorly on the left side, there is fairly brisk bleeding that was controlled with suction cautery.  An extended hemitransfixion incision was made along the caudal edge of the septum on the left side.  The mucoperichondrium was elevated posteriorly back about 3 cm, and then the septum mucosa was cut longitudinally, and the approximate anterior 3 cm of septal mucosa that was very hemorrhagic was removed.  The skin graft was then cut to appropriate size and secured anteriorly with some interrupted 4-0 chromic sutures through the skin graft anteriorly on the nose, and then this was placed back posteriorly over the cartilage back to the posterior cut edge of the septal mucosa. The graft was basted with two 4-0 chromic sutures in its midportion and then a 0.03 thickness piece of Silastic was placed overlying the graft with a separate piece placed on the right airway and a 3-0 nylon suture  was used to secure the Silastic over the graft.  The nose was then packed with Merocel placed on either side and hydrated with saline. This completed the procedure.  The patient was awoke from anesthesia and transferred to the postop doing well.  DISPOSITION:  The patient will be discharged home on Keflex 500 mg t.i.d. for 1 week, Tylenol and Vicodin p.r.n. pain.  We will have her return to my office tomorrow to have her pack removed from the right side of nose and have her follow up in 1 week to have the pack removed from the left side of the nose.          ______________________________ Leonides Sake. Lucia Gaskins,  M.D.     CEN/MEDQ  D:  05/13/2010  T:  05/14/2010  Job:  [id number]  Electronically Signed by Melony Overly M.D. on 06/04/2010 12:55:00 PM

## 2010-06-06 ENCOUNTER — Encounter (HOSPITAL_BASED_OUTPATIENT_CLINIC_OR_DEPARTMENT_OTHER): Payer: Medicare Other | Admitting: Hematology and Oncology

## 2010-06-06 DIAGNOSIS — D509 Iron deficiency anemia, unspecified: Secondary | ICD-10-CM

## 2010-06-06 DIAGNOSIS — I780 Hereditary hemorrhagic telangiectasia: Secondary | ICD-10-CM

## 2010-06-10 ENCOUNTER — Encounter (HOSPITAL_BASED_OUTPATIENT_CLINIC_OR_DEPARTMENT_OTHER): Payer: Medicare Other | Admitting: Hematology and Oncology

## 2010-06-10 DIAGNOSIS — D509 Iron deficiency anemia, unspecified: Secondary | ICD-10-CM

## 2010-06-10 DIAGNOSIS — I780 Hereditary hemorrhagic telangiectasia: Secondary | ICD-10-CM

## 2010-06-24 NOTE — Discharge Summary (Signed)
[name], [name] NO.:  [id number]   MEDICAL RECORD NO.:  [id number]          PATIENT TYPE:  INP   LOCATION:  5525                         FACILITY:  [facility]   PHYSICIAN:  Nolene Ebbs, M.D.    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  She came to the emergency room at [facility] with a  3-day history of progressively worsening weakness and fatigue.  She had  no fevers or chills, cough or sputum production.  She has some bright  red blood per rectum and initial evaluation in the emergency room  revealed normal vital signs, and her laboratory data showed a hemoglobin  of 5.7, INR was 1.2 with PTT of 30.  She was therefore thought to have  had some gastrointestinal bleeding, admitted to the hospital for blood  transfusion and further workup.   HOSPITAL COURSE:  On admission, the patient was placed on medical bed.  He was transfused with 3 units of packed red blood cell transfusion.  Gastroenterology consult was requested, and the patient was kindly seen  by Dr. Cristina Gong.  He went onto perform upper GI endoscopy on May 14, 2008, revealing arteriovenous malformation x2 in the duodenum without  obvious bleeding.  Ear, nose, and throat consult was therefore  requested, and the patient was seen by Dr. Janace Hoard who recommended saline  sprays to the nose.  The patient had no further bleeding in the  hospital.  On May 15, 2008, she was feeling better.  Vital signs  were  stable.  No further nosebleeds for 48 hours.  CBG was 197, hemoglobin  was 8.9 and was therefore considered stable for discharge home.   DISCHARGE DIAGNOSES:  1. Severe epistaxis.  2. Duodenal arteriovenous malformations.  3. Symptomatic anemia.   Her condition on discharge was stable.   Her disposition was for home.   DISCHARGE MEDICATIONS:  1. Metformin 1000 mg daily.  2. Lantus insulin 36 units daily.  3. Enalapril 5 mg daily.  4. Protonix 40 mg daily.   She was to follow up with me in the office in 2 weeks and also with Ear,  Nose, and Throat as previously arranged.      Nolene Ebbs, M.D.  Electronically Signed     EA/MEDQ  D:  07/11/2008  T:  07/12/2008  Job:  [id number]

## 2010-06-24 NOTE — Discharge Summary (Signed)
[name], [name]                ACCOUNT NO.:  [id number]   MEDICAL RECORD NO.:  [id number]          PATIENT TYPE:  INP   LOCATION:  [id number]                         FACILITY:  [facility]   PHYSICIAN:  Benito Mccreedy, M.D.DATE OF BIRTH:  23-May-1940   DATE OF ADMISSION:  05/12/2008  DATE OF DISCHARGE:                               DISCHARGE SUMMARY   CHIEF COMPLAINT:  Generalized weakness.   HISTORY OF PRESENT ILLNESS:  The patient is a 70 year old lady who  presented to the ED with 3-day history of progressive worsening  generalized weakness with fatigue.  No associated fever or chills,  cough, sputum production.  She has not had any bright red blood per  rectum.  She has not had any chest pain.  She has not had any polyuria  or polydipsia.  At the ED, the patient was evaluated by the ED physician  Dr. Kae Heller.  Blood work reviewed,  __________ anemia with heme-positive  stools.  The patient has a history of GI bleed, status post upper and  lower endoscopic workup as well as capsular endoscopy which had revealed  AV malformations.  She was hemodynamically stable.  She was admitted for  blood transfusion and a GI followup.   PAST MEDICAL HISTORY:  Positive for history of diabetes, anemia of  chronic blood loss, GERD, hyperlipidemia, colonic telangiectasia, AV  malformation.  She has had evaluation for this on two occasions with  cauterization to stop bleeding.  History of Osler/Weber/Rendu disease  with recurrent GI bleed suggestive of AV malformation and  telangiectasia.  Has a history of iron-deficiency anemia from her blood  loss.  She also has a history of osteoporosis.   CURRENT MEDICATIONS:  Lantus, Protonix, and metformin, as well as iron,  Zocor, and enalapril.   ALLERGIES:  The patient is allergic to aspirin, which causes nausea and  vomiting.   PAST SURGICAL HISTORY:  The patient is status post multiple upper GI  endoscopies  __________   SOCIAL HISTORY:  She lives with  her family.  Does not smoke cigarette or  drink alcohol.   REVIEW OF SYSTEMS:  On 10-point systemic enquiry was unremarkable except  as noted above.  Clearly, the patient denies any abdominal pain.   PHYSICAL EXAMINATION:  VITAL SIGNS:  BP 144/72, pulse 82, respirations  16, and temperature 98.7 degrees Fahrenheit.  GENERAL:  This is a __________ [place] American lady, lying in bed, not  in acute cardiopulmonary distress.  HEENT:  Normocephalic and atraumatic.  Pupils equal, round, reactive to  light.  Extraocular movements intact.  Oropharynx moist.  __________  present.  NECK:  Supple.  No JVD.  No cervical lymphadenopathy.  No carotid bruit.  LUNGS:  Vesicular breath sounds.  No crackles or wheezes.  CARDIAC:  Regular.  No gallops or murmur.  ABDOMEN:  Soft, nontender.  Bowel sounds are present.  EXTREMITIES:  No cyanosis.  No edema.  NEUROLOGIC:  The patient is alert and oriented x3 without any deficits.   LABORATORY DATA:  White count 6.2, hemoglobin 5.7, hematocrit 17.7, MCV  75.1, and platelet count 243.  Protime 15.5, INR 1.2, and PT 30.  Sodium  141, potassium 4.0, chloride 105, glucose 282, BUN 18, creatinine is  0.8.  Stool for hemoccult was positive.   ASSESSMENT AND PLAN:  This is a 70 year old lady with multiple  transfusions for GI bleed, was admitted for blood transfusion for  critical anemia.  She is hemodynamically stable and therefore GI  Medicine will be asked to see her at this time.  It will be Monday but  we will hold her diabetic medications.  We will check H and H serially.  We will check post transfusion labs when she will be admitted  __________.      Benito Mccreedy, M.D.  Electronically Signed     GO/MEDQ  D:  05/13/2008  T:  05/14/2008  Job:  [id number]

## 2010-06-24 NOTE — Consult Note (Signed)
NAMECATOYA, FEMRITE NO.:  [id number]   MEDICAL RECORD NO.:  [id number]          PATIENT TYPE:  INP   LOCATION:  [id number]                         FACILITY:  [facility]   PHYSICIAN:  Ronald Lobo, M.D.   DATE OF BIRTH:  June 05, 1940   DATE OF CONSULTATION:  05/13/2008  DATE OF DISCHARGE:                                 CONSULTATION   Dr. Benito Mccreedy, covering for Dr. Jeanie Cooks, asked me to see this 70-  year-old African American female because of severe anemia.   Ms. Bennison was admitted through the emergency room last night with a  hemoglobin of 5.7, following a 2-week prodrome of exertional dyspnea and  tiredness.  She was strongly Hemoccult positive, but without melenic  stool, on examination in the emergency room.   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  She was last endoscoped for these in July  2008, at which time argon plasma coagulation of 5 small gastric vascular  malformations were performed.  She has been followed by our practice for  this problem since January 2003, and her most recent testing has  included the above-mentioned endoscopy almost 2 years ago, video capsule  endoscopy in April 2008 that showed some oozing gastric vascular ectasia  and several AVMs in the distal duodenum or proximal jejunum; colonoscopy  in 2007 that was normal; and endoscopy in December 2007.  There have  been at least 2 occasions where the patient has had argon plasma  coagulation therapy of her gastric AVMs.   The patient is followed at the cancer center by Dr. Jamse Arn and gets  periodic iron infusions, she thinks the most recent one was a couple of  months ago.  On that regimen, she did quite well for about a year.  She  had transfusions in February 2009, and then did not need any additional  transfusions until March 2010, at which time she received 2 units of  packed cells for a hemoglobin of 6.8.  It looks like  the patient has  been running a hemoglobin of 9-10 over the past year until this past  March, that brought her hemoglobin transiently up to 8.4.  She received  2 additional units last night.   The patient has had some heartburn recently despite being on twice daily  Protonix.  She uses occasional Advil.   PAST MEDICAL HISTORY:  She has a listed allergy to ASPIRIN.   OUTPATIENT MEDICATIONS:  1. Metformin 1000 mg daily.  2. Lantus insulin.  3. Enalapril 5 mg daily.  4. Protonix 40 mg twice daily.   OPERATIONS:  None.   CHRONIC MEDICAL ILLNESSES:  Type 2 diabetes.  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   HABITS:  Nonsmoker, nondrinker.   FAMILY HISTORY:  Negative for GI illnesses such as colon cancer, ulcers,  or gallbladder disease.  Her sister might have had some sort of a  bleeding problem, but details are obscure.   SOCIAL  HISTORY:  The patient is working on her GED at this time, so she  can go to [employer].  She is married.   REVIEW OF SYSTEMS:  Pertinent for reflux symptoms as noted, but no  significant lower GI symptoms.   PHYSICAL EXAMINATION:  GENERAL:  This is a stocky pleasant African  American female in no distress.  HEENT:  She is without evident pallor or icterus.  SKIN:  Pertinent for an extensive rash on her lower extremities  consistent with lichen planus.  CHEST:  Clear.  No rales or wheezes heard.  HEART:  Normal, no gallops, rubs, murmurs or arrhythmias.  ABDOMEN:  Somewhat adipose, no guarding, mass or tenderness.  RECTAL:  On admission showed brown, strongly heme-positive stool per ER  physician and was not repeated.   LABORATORY FINDINGS:  Admission hemoglobin was 5.7 with an MCV of 75 and  an RDW of 21, white count 62, 100 platelets 243,000, neutrophils 76%  lymphocytes 11%, monocytes 10%.   IMPRESSION:  The clinical presentation is very  compatible with  recurrent, subacute gastrointestinal blood loss, presumably from her  known gastric vascular malformations.  Since the patient has had a  negative colonoscopy within the past 2-1/2 years, it is unlikely she has  colorectal neoplasia.  This track record of a fairly abrupt drop in  hemoglobin is nothing new; in March 2006, she dropped 4 g of hemoglobin  over a 64-month interval.  However, the reflux symptoms despite Protonix,  and the use of Advil, bring to mind at least the possibility of  processes other than her vascular ectasia as the source of the anemia.   PLAN:  1. We will give the patient an additional unit of packed cells today      since her hemoglobin is still low.  2. We will plan on endoscopic evaluation tomorrow, with probable argon      plasma coagulation therapy of her vascular ectasia.   We appreciate the opportunity to see this patient again.           ______________________________  Ronald Lobo, M.D.     RB/MEDQ  D:  05/13/2008  T:  05/14/2008  Job:  [id number]   cc:   Nolene Ebbs, M.D.  Lauretta I. Odogwu, M.D.

## 2010-06-24 NOTE — Discharge Summary (Signed)
[name], [name]                ACCOUNT NO.:  [id number]   MEDICAL RECORD NO.:  [id number]          PATIENT TYPE:  OBV   LOCATION:  6713                         FACILITY:  [facility]   PHYSICIAN:  Jamal Collin. Hensel, M.D.DATE OF BIRTH:  10/05/1940   DATE OF ADMISSION:  08/27/2006  DATE OF DISCHARGE:  08/27/2006                               DISCHARGE SUMMARY   DISCHARGE DIAGNOSES:  1. Anemia secondary to blood loss.  2. 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  3. History of frequent nosebleeds.  4. Severe lichen planus.  5. Diabetes.  6. Hypertension.  7. Hyperlipidemia.   CONSULTATIONS:  None.   PROCEDURES PERFORMED:  Transfusion of 2 units of packed red blood cells  on August 27, 2006.   LABORATORY INVESTIGATIONS:  Pertinent laboratory data includes:  CBC in  clinic prior to admission result of hemoglobin of 7.8.  Post transfusion  hemoglobin was 10.6.   HOSPITAL COURSE:  This is a 70 year old African American female with a  chronic GI bleed secondary to telangiectasias resulting in chronic iron  deficiency anemia due to blood loss who was admitted for acute blood  loss requiring transfusion.   1. Anemia secondary to acute blood loss:  The patient was admitted for      23-hour observation and transfusion of 2 units of packed red blood      cells.  The patient remained stable, afebrile, with stable vital      signs throughout the admission and throughout the transfusion      process.  The patient's physical examination remained normal status      post transfusion and the patient's post transfusion hemoglobin rose      from 7.8 to 10.6.  2. Hypertension:  The patient was continued on home blood pressure      medications and blood pressure remained relatively stable      throughout the hospitalization.  3. Hyperlipidemia:  The patient was maintained on her home dose of      simvastatin 80 mg p.o. daily.  4. Type 2  diabetes:  The patient was continued on home dose of Lantus,      Metformin and enalapril.   FOLLOW-UP:  The patient is to call the family practice center on Monday  for a followup appointment with Dr. Erling Cruz next week.   DISCHARGE INSTRUCTIONS:  The patient is to follow a diabetic diet and  increase activity slowly.   MEDICATIONS ON DISCHARGE:  1. Enalapril 5 mg p.o. daily.  2. Ferrous sulfate two teaspoons p.o. twice per day.  3. Lantus 35 units subcu at bedtime.  4. Metformin 1000 mg p.o. twice per day with food.  5. Femcon Fe 0.4/35 mg/mcg one tablet p.o. daily as directed.  6. Prilosec 20 mg p.o. daily.  7. Simvastatin 80 mg p.o. daily.   DISPOSITION:  The patient was discharged home in stable medical  condition after transfusion of 2 units of packed red blood cells with  adequate rise in hemoglobin to 10.6.      Shella Maxim, M.D.  Electronically Signed      Jamal Collin. Andria Frames, M.D.  Electronically Signed    EE/MEDQ  D:  08/27/2006  T:  08/28/2006  Job:  [id number]

## 2010-06-24 NOTE — Op Note (Signed)
[name], [name]                ACCOUNT NO.:  [id number]   MEDICAL RECORD NO.:  [id number]          PATIENT TYPE:  INP   LOCATION:  5525                         FACILITY:  [facility]   PHYSICIAN:  James L. Rolla Flatten., M.D.DATE OF BIRTH:  07-Oct-1940   DATE OF PROCEDURE:  05/14/2008  DATE OF DISCHARGE:                               OPERATIVE REPORT   PROCEDURE:  Esophagogastroduodenoscopy.   MEDICATIONS:  1. Cetacaine spray.  2. Fentanyl 100 mcg.  3. Versed 9 mg IV.   INDICATIONS:  Patient with known AVMs in the stomach and duodenum  presented with positive stools on low hemoglobin.  She has had multiple  episodes of endoscopy with cautery of AVMs in the past.  Plan was to  perform an upper endoscopy and cauterize any AVMs that were bleeding.   DESCRIPTION OF PROCEDURE:  The procedure has been explained to the  patient and consent obtained.  She had this done on several occasions.  In the left lateral decubitus position, the Pentax upper scope was  inserted and advanced.  The stomach was entered.  There was some mild  gastritis at the antrum.  I did not see any active bleeding.  We went  down into the second duodenum and examined this.  There were two small  AVMs that were seen in the second duodenum.  Again, they were not  actively bleeding.  There was no signs of active or recent bleeding  whatsoever in the duodenum or stomach.  The scope was withdrawn back  into the stomach.  The stomach carefully examined.  I did not see any  gastric AVMs.  There was a hiatal hernia with no bleeding lesions in the  esophagus.  We then replaced the scope back down into the second  duodenum and placed the argon plasma coagulator probe through the scope.  At this point, we noticed blood on the tissue and upon examination the  patient was seen to have a vigorous anterior nosebleed.  We sucked  blood at the back of the throat but most of this was coming out of the  anterior nose and it appeared to  be an anterior nosebleed.  Given the  fact that she was actively bleeding from her nose, I decided not to  electively cauterize these nonbleeding AVMs in the duodenum and the  stomach and duodenum were decompressed.  The scope was slowly withdrawn  back up into the esophagus.  There was no blood in the esophagus and in  the posterior pharynx with only a minimal amount of blood, most of it  was coming out of the nostrils consistent with anterior nosebleed.  The  throat was suctioned very well and she was maintained in the left  lateral decubitus position.  Her blood pressure was high with systolics  over [id number], but she normally runs around 180 anyway.  At this point, the  scope was removed and the patient was maintained in left lateral  decubitus position with pressure on her anterior nose it appeared to be  coming primarily from her right nostril.   ASSESSMENT:  1.  Severe anterior nosebleed, possibly the cause of her recent drop in      hemoglobin.  2. Duodenal arteriovenous malformations not bleeding and not treated      at this particular point in time.           ______________________________  Joyice Faster Rolla Flatten., M.D.     [name]  D:  05/14/2008  T:  05/14/2008  Job:  [id number]   cc:   Nolene Ebbs, M.D.

## 2010-06-24 NOTE — Op Note (Signed)
NAME:  [name], [name]                ACCOUNT NO.:  [id number]   MEDICAL RECORD NO.:  [id number]          PATIENT TYPE:  AMB   LOCATION:  ENDO                         FACILITY:  [facility]   PHYSICIAN:  [name] C. [name] Plenty, M.D.    DATE OF BIRTH:  September 03, 1940   DATE OF PROCEDURE:  08/19/2006  DATE OF DISCHARGE:                               OPERATIVE REPORT   PROCEDURE:  Esophagogastroduodenoscopy with laser therapy.   ENDOSCOPIST:  Elyse Jarvis. [name] Plenty, M.D.   INDICATIONS FOR PROCEDURE:  Patient with known multiple gastric and  small bowel AVMs causing recurrent bleeding.   PROCEDURE:  The patient was placed in the left lateral decubitus  position and placed on the pulse monitor with continuous low-flow oxygen  delivered by nasal cannula.  She was sedated with 75 mcg IV fentanyl and  6 mg IV Versed.  The Olympus video endoscope was advanced under direct  vision into the oropharynx and esophagus.  The esophagus was straight  and of normal caliber with the squamocolumnar line at 38 cm.  There was  no hiatal hernia, ring, stricture or other abnormality of the GE  junction. The stomach was entered and about 5 AVMs were seen in the body  of stomach;3 of these were small; 2 moderate-sized; none were bleeding;  all were obliterated with ERBE argon plasma coagulator.  The antrum,  duodenum and postbulbar duodenum appeared normal with no further AVMs  seen.  The scope was withdrawn and the patient returned to the recovery  room in stable condition.  She tolerated the procedure well and there  were no immediate complications.   IMPRESSION:  Gastric arteriovenous malformation, otherwise normal study.   PLAN:  We will follow stools and hemoglobin for the possibility of  further ongoing bleeding.           ______________________________  Elyse Jarvis. [name] Plenty, M.D.     JCH/MEDQ  D:  08/19/2006  T:  08/20/2006  Job:  [id number]   cc:   [place]

## 2010-06-24 NOTE — H&P (Signed)
[name], [name]                ACCOUNT NO.:  [id number]   MEDICAL RECORD NO.:  [id number]          PATIENT TYPE:  OBV   LOCATION:  [id number]                         FACILITY:  [facility]   PHYSICIAN:  Marzetta Merino, M.D.     DATE OF BIRTH:  August 26, 1940   DATE OF ADMISSION:  08/27/2006  DATE OF DISCHARGE:  08/27/2006                              HISTORY & PHYSICAL   CHIEF COMPLAINT:  Anemia.   HISTORY OF PRESENT ILLNESS:  The patient was seen today in clinic for  anemia.  Her hemoglobin was noted to be 7.8.  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  The patient came in  complaining of being tired and fatigued.  The patient is on chronic iron  therapy.  Her past medical history is significant for type 2 diabetes  and hypercholesterolemia.  Her GI doctor is Dr. [name] Plenty with [facility].  The patient last month had been treated at [facility] by endoscopy for sclerotherapy of these lesions.  The patient has  had attempted estrogen therapy previously for one month, but could not  afford the medication.  There is no history of coronary artery disease.  The patient gets transfusions of packed red blood cells for symptoms of  blood loss anemia fairly regularly.  Attempt was made to have the  patient admitted to short stay, however short stay was unable to admit  the patient for transfusion, so she is going to do a 23-hour observation  status as an inpatient.   ALLERGIES:  ASPIRIN.   PAST MEDICAL HISTORY:  Significant for:  1. 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  Her      colonoscopy was normal in December 2007.  2. She has a history of nose bleeds.  3. 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  4. She receives the frequent transfusions for anemia related to blood      loss.   PAST SURGICAL HISTORY:  Includes:  1. EGDs.  2. Small bowel follow throughs multiple  times.   FAMILY HISTORY:  She has 3 sisters with Alzheimer's, a brother with MI,  but no strokes.  Her sister has breast cancer; otherwise she is fairly  healthy.  She is married.   SOCIAL HISTORY:  She smokes 3 cigarettes a day for 2 years, a long time  ago, but now she does not use any alcohol or drugs or cigarettes.  She  lives with her husband.  She has no children.  [place] and worked her whole life on the farm.  She is a TEFL teacher.  She attends church regularly, and her faith is very important  to her.   REVIEW OF SYSTEMS:  See HPI.   PHYSICAL EXAMINATION:  GENERAL:  She is well-developed, well-nourished,  no acute distress.  Alert, appropriate and cooperative throughout  examination.  HEAD:  Normocephalic, atraumatic without obvious abnormalities.  No  apparent alopecia  or balding.  EYES:  No corneal or conjunctival inflammation noted.  Extraocular  muscles intact.  Pupils equally reactive to light bilaterally.  MOUTH:  Oral mucosa, oropharynx without lesion or exudate.  Teeth in  good repair.  LUNGS:  Normal respiratory effort.  Chest expands symmetrically.  Lungs  are clear to auscultation.  No crackles or wheezes.  HEART:  Normal rate and rhythm.  S1 and S2 were normal without gallop,  murmur, clicks, rub or extra sounds.  ABDOMEN:  Soft and nontender without masses or hernias noted.  MUSCULOSKELETAL:  There is no deformity or scoliosis in her thoracic or  lumbar spine.  EXTREMITIES:  She has severe hypopigmentation areas due to her lichen  sclerosis, but she has full range of motion and no edema.  NEUROLOGIC:  There are no cranial nerve deficits located.  Station and  gait are normal.  VITAL SIGNS:  Not taken during this admission at the time of H&P.   IMPRESSION/RECOMMENDATIONS:  1. Anemia secondary to acute blood loss.  The patient will be admitted      to a 23-hour observation on August 27, 2006 for transfusion of 2      units of  packed red blood cells.  We will type and cross her on      Friday.  The admitting area will call the patient when a bed      becomes available.  The orders are written and have been handed to      Dr. Carmie End.  Her hemoglobin was 7.8 and chronic on August 26, 2006.      We will have the patient talk and and try to reschedule with Dr.      [name] Plenty.  We have updated her medication list.  2. For symptoms of nose bleeds, telangiectasias, will start the      patient on Femcon 1 tablet daily for bleeding.  This contains 0.4      mg of estrogen.  If this makes a difference, then we will see if we      can get the patient on a drug payment assistance program.  The      patient was instructed of the risks of blood clots and breast      cancer with estrogen replacement therapy.  She still has her      uterus, but she is menopausal.  3. Hypertension:  We are going to continue her enalapril while she is      in the hospital.  This is currently well controlled.  4. Hyperlipidemia:  We will are going to continue her simvastatin      while she is in the hospital as well.  This seems to be well      controlled.  5. Diabetes mellitus type 2:  She is on Lantus 35 units at night and      metformin.  We will continue these while she is in the hospital.      This has currently not been a problem for her.  6. For her anemia of iron deficiency, we will continue her iron      supplementation.   At discharge, the patient will be discharged status post a 23-hour  observation and admission on August 27, 2006.  The patient will contact  Dr. [name] Plenty for further recommendations of possible secondary opinions on  this matter.  We will have her follow back up in the clinic here in the  next 1-2 weeks.  Marzetta Merino, M.D.  Electronically Signed     ML/MEDQ  D:  08/27/2006  T:  08/28/2006  Job:  [id number]

## 2010-06-24 NOTE — H&P (Signed)
NAMEJERMIAH, BASICH                ACCOUNT NO.:  [id number]   MEDICAL RECORD NO.:  [id number]          PATIENT TYPE:  INP   LOCATION:  5118                         FACILITY:  [facility]   PHYSICIAN:  Nolene Ebbs, M.D.    DATE OF BIRTH:  11-05-1940   DATE OF ADMISSION:  03/10/2007  DATE OF DISCHARGE:                              HISTORY & PHYSICAL   PRESENTING COMPLAINTS:  Weakness and fatigue.   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  She was seen in the office today complaining of  one week of progressive weakness, fatigue and inability to ambulate for  long distances without getting short of breath.  She denied having any  abdominal pain, heartburn, regurgitation, diarrhea, melena or  constipation.  She felt like her blood count was low and therefore came  to the office for evaluation.  On evaluation in the office, she was not  in any acute respiratory or painful distress.  Her blood pressure was  138/62, heart rate of 72 regular, but she was noticeably pale and looked  fatigued.  Efforts were therefore made for her to be admitted to the  hospital for check of her blood count and possible blood transfusion.   PAST MEDICAL HISTORY:  1. 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  2. Iron-deficiency anemia.  3. Type 2 diabetes mellitus.  4. Gastroesophageal reflux disease.  5. Five osteoporosis.   MEDICATION HISTORY:  1. Metformin 1000 mg daily.  2. Enalapril 5 mg daily.  3. Lantus insulin 46 units at bedtime.  4. Reglan 5 mg t.i.d. a.c.  5. Protonix 40 mg b.i.d.  6. Ferrous sulfate 220 mg in 5 mL 10 mL p.o. b.i.d.  7. Zocor 80 mg daily.   ALLERGIES:  She is allergic to ASPIRIN which  causes nausea and vomiting.   SURGICAL HISTORY:  She has had multiple upper GI endoscopies under the  care of Dr. Carol Ada.   SOCIAL HISTORY:  She lives with her family.  She denies any use of  alcohol, tobacco or illicit drugs.   REVIEW OF SYSTEMS:  GENERAL:  She has no fevers or chills.  GI: She has  no abdominal pain, nausea, vomiting, diarrhea or constipation.  GU: She  had no dysuria, frequency or hematuria.  CARDIOVASCULAR:  She has no  chest pain, shortness of breath at rest, orthopnea or PND.  RESPIRATORY:  She has no cough, sputum production or hemoptysis.  MUSCULOSKELETAL:  She has no joint pains or joint swellings.  NEUROLOGICALLY:  She has no  headaches, seizures, syncope or focal weakness.   PHYSICAL EXAMINATION:  GENERAL APPEARANCE:  She is lying comfortably in  the hospital bed not in acute respiratory or painful distress.  VITAL SIGNS:  Her initial vital signs shows a blood pressure 149/78,  heart rate of 108 regular, respiratory 20, temperature 99.7, O2 sat on  room air is 96%.  She weighs in at 68.94 kilograms, had a height of 63  inches.  Her CBG is 398.  HEENT:  Oral mucosa is moist.  Her conjunctivae is pale.  Her sclerae  are clear.  NECK:  Supple with no elevated JVD.  No cervical lymphadenopathy.  No  carotid bruits.  CHEST:  Good air entry bilaterally with no rales, rhonchi or wheezes.  CARDIOVASCULAR:  Heart sounds S1 and S2 are heard with no murmurs.  ABDOMEN:  Obese, soft, nontender, no masses.  Bowel sounds are present.  EXTREMITIES:  No edema.  No calf tenderness or swelling.  CNS: She is alert and oriented x3 with no focal neurological deficits.   LABORATORY DATA:  White count is 6.8, hemoglobin is 5.7, hematocrit 18.2  and platelets of 190.   ASSESSMENT:  Ms. Dolores Osness is a 70 year old African American lady  with recurrent gastrointestinal bleeding secondary to arteriovenous  malformations resulting in chronic iron-deficiency anemia  requiring  frequent hospitalizations for blood transfusions.   ADMISSION DIAGNOSES:  Symptomatic anemia.   PLAN OF CARE:  She is to be admitted to a medical bed and transfused  with 2 units of packed red blood cells tonight and hopefully be  discharged home in the morning.  This plan has been discussed with her  and her questions answered.      Nolene Ebbs, M.D.  Electronically Signed     EA/MEDQ  D:  03/10/2007  T:  03/11/2007  Job:  [id number]

## 2010-06-27 NOTE — Discharge Summary (Signed)
NAMEELERI, TINER                ACCOUNT NO.:  [id number]   MEDICAL RECORD NO.:  [id number]          PATIENT TYPE:  INP   LOCATION:  [id number]                         FACILITY:  [facility]   PHYSICIAN:  Billey Chang, M.D.     DATE OF BIRTH:  [birth year]   DATE OF ADMISSION:  10/24/2004  DATE OF DISCHARGE:  10/29/2004                                 DISCHARGE SUMMARY   DISCHARGE DIAGNOSES:  1.  Upper gastrointestinal bleed secondary to arteriovenous malformation.  2.  Iron-deficiency anemia.  3.  Diabetes mellitus, type 2.  4.  Hypertension.  5.  Hypercholesterolemia.  6.  Flash pulmonary edema.   PROCEDURES DURING HOSPITALIZATION:  1.  Upper endoscopy performed on October 27, 2004.  2.  2-D echocardiogram performed on October 28, 2004.   CONSULTATIONS DURING HOSPITALIZATION:  Gastroenterology from [facility]. The patient is a routine patient of Dr. [name] Plenty.   HOSPITAL COURSE:  Ms. Zhou is a 70 year old African American female with a  history of upper GI bleed secondary to AVMs and telangiectasias. Also has a  history of iron-deficiency anemia, diabetes, hypertension, and  hypercholesterolemia. She presented to [facility] on September  15th with a chief complaint of fatigue and generalized weakness. At that  time her hemoglobin was drawn and it was 6.1. The patient was immediately  transfused with one unit packed red blood cells and developed acute onset of  shortness of breath. She was therefore diuresed. Her chest x-ray was found  to have cardiomegaly as well as pulmonary edema versus infectious process.  Because of this the patient was started on Levaquin. Upon presentation to [place]  the patient's initial hemoglobin was found to be 8.6 after being transfused  to one unit. The next day the patient did drop down once again to a  hemoglobin of 6.5.  At that time she was transfused again two units of  packed red blood cells with 40 mg of Lasix in between of  diuresis. At that  time a gastroenterology consult was obtained secondary to her history of  AVMs in the past. She was last ablated in August 2006. The patient did  undergo an upper endoscopy on October 28, 2004, in which multiple AVMs  were ablated. She was also placed on Protonix 40 mg b.i.d. For the patient's  anemia, an iron profile was obtained which was consistent with anemia of  chronic disease. Therefore, once the patient was taking adequate p.o. we  placed her back on ferrous sulfate 325 mg t.i.d. and at discharge her  hemoglobin was 9.6.  From a diabetes standpoint, on the day of discharge her  CBGs ranged from 138 to 270, a fasting sugar of 144 was noted. Therefore,  the patient's Lantus dose was increased from 44 units to 46 units to obtain  a goal of fasting sugar of less than 120. She was also told to continue on  her metformin 500 mg b.i.d. A urine microalbumin was obtained in order to  determine if the patient needed to be on an ACE inhibitor. Of note, she did  have episodes  of hypotension prior to transfusion with systolics in the 0000000.  On the day of discharge her systolics ran from [id number] to 125 and her urine  microalbumin came back at 0.25.  Therefore, we have considered holding the  patient's enalapril as a follow up with her hypertension on an outpatient  basis or hypercholesterolemia. The patient had a fasting lipid profile  checked in April 2006 and she is currently on Lipitor 20 mg p.o. daily. For  the patient's acute episode of pulmonary edema, after being diuresed, her  chest x-ray became clear and there were no infiltrates seen. Therefore, her  dose of Levaquin, which was changed to Avelox, was discontinued. We did  obtain a 2-D echo to assess the patient's ejection fraction and it was found  to be 55% to 65%, with some left ventricular wall thickness, a dilated right  ventricle as well as a dilated right atrium, and there was trivial mitral  regurgitation. On the  day of discharge the patient was saturating greater  than 94% on room air, breathing 16 to 18 times per minute. Her acute dyspnea  had resolved and she was afebrile at discharge.   DISCHARGE MEDICATIONS:  1.  Lantus 46 units subcu q.h.s.  2.  Metformin 500 mg tab b.i.d. to take with meals.  3.  Prescription given for Protonix 40 mg tab to take p.o. b.i.d., one      month's supply given .  4.  Ferrous sulfate 325 mg t.i.d.  5.  Simvastatin 20 mg p.o. daily.  Of note the patient's Lantus dose was      increased from 44 units to 46 units to obtain a morning glucose of less      than 120, and we did hold the patient's enalapril and she was instructed      no to take this medication.  6.  The patient was told to return to the clinic or the emergency department      for weakness, shortness of breath, or changes in any stool, specifically      for bright red blood per rectum or for any concerns of hematemesis. She      was given a follow-up appointment with her primary care physician at      [facility], Dr. Manus Rudd, to follow upon November 07, 2004, at 8:45 a.m. and given the number [phone] to reschedule an      appointment if needed. She is also given an appointment to follow up      with Dr. [name] Plenty at [facility] on December 03, 2004, at 2 p.m.      and given the number [phone] if she needs to adjust this appointment as      well.      Richardine Service, M.D.    ______________________________  Billey Chang, M.D.    MR/MEDQ  D:  10/29/2004  T:  10/30/2004  Job:  [id number]   cc:   [facility]

## 2010-06-27 NOTE — Discharge Summary (Signed)
[name], [name] NO.:  [id number]   MEDICAL RECORD NO.:  [id number]          PATIENT TYPE:  INP   LOCATION:  [id number]                         FACILITY:  [place]   PHYSICIAN:  Verner Chol, MD DATE OF BIRTH:  [birth year]   DATE OF ADMISSION:  DATE OF DISCHARGE:  09/05/2005                                 DISCHARGE SUMMARY   PRIMARY CARE PHYSICIAN:  Servando Snare, M.D. of [facility]   GASTROENTEROLOGIST:  Elyse Jarvis. [name] Plenty, M.D.   DISCHARGE DIAGNOSES:  1.  Anemia secondary to chronic blood loss from gastrointestinal      telangiectasia.  2.  Diabetes mellitus type 2.  3.  Gastroesophageal reflux disease.  4.  Hyperlipidemia.  5.  Hypokalemia.   DISCHARGE MEDICATIONS:  1.  Enalapril 5 mg p.o. daily.  2.  Lantus 40 units subcutaneously nightly at bedtime.  3.  Metformin 500 mg p.o. b.i.d.  4.  Prilosec OTC, 20 mg p.o. b.i.d.  5.  Simvastatin 40 mg p.o. daily.  6.  Iron sulfate 325 mg p.o. b.i.d.   FOLLOW UP INSTRUCTIONS:  The patient is to follow up with Servando Snare,  M.D.  She has an appointment in 24 hours.  The patient also has an  appointment with Dr. [name] Plenty on September 18, 2005.   CONSULTATIONS:  None.   PROCEDURE:  Transfusion of 3 units packed red blood cells.   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  She is transfused frequently  for iron deficiency anemia.  She presented to the Clinic on the day of  admission with a 1-week history of fatigue and weakness.  Her hemoglobin in  clinic was less than 7, and she was admitted to [facility] for transfusion.   1.  Anemia.  The patient's CBC on admission was 6.8.  She was transfused 2      units of packed red blood cells and premedicated with Benadryl since she      has a history of itching with transfusion.  Her hemoglobin after the 2      units was 9.4.  She was transfused 1 more unit of  packed red blood cells      prior to discharge.  She was instructed to continue her iron sulfate 325      mg p.o. b.i.d. which she has been taking at home.   1.  Hypokalemia.  The patient's potassium was 3.4 on Basic Metabolic Panel      the morning of discharge.  She received 40 mEq K-Dur prior to discharge.   1.  Diabetes and hyperlipidemia.  The patient was continued on her home      medications including Metformin, Lantus, Enalapril and Simvastatin.     ______________________________  Dixon Boos, M.D.    ______________________________  Verner Chol, MD    MJ/MEDQ  D:  09/05/2005  T:  09/06/2005  Job:  [id number]   cc:   Elyse Jarvis. [name] Plenty, M.D.  Fax: [phone]

## 2010-06-27 NOTE — H&P (Signed)
[name], GARNET NO.:  [id number]   MEDICAL RECORD NO.:  [id number]          PATIENT TYPE:  INP   LOCATION:  2920                         FACILITY:  [facility]   PHYSICIAN:  Malena Catholic, M.D.DATE OF BIRTH:  03-Feb-1941   DATE OF ADMISSION:  10/24/2004  DATE OF DISCHARGE:                                HISTORY & PHYSICAL   The patient is on the Family Medicine Teaching Service.   HISTORY OF PRESENT ILLNESS:  The patient is a 70 year old female who had  presented to [facility] ER with symptoms of fatigue and  generalized weakness. She had a past medical history of chronic anemia and a  CBC was drawn showing a hemoglobin of 6.1. I was contacted and the decision  was made to consult Family Practice for admission. The ER physician began  transfusion of packed red blood cells and during the first unit she  developed symptoms of shortness of breath. It was reported to me that she  had developed crackles and the decision was made to give her Lasix 20 mg in  the ER. The patient was then upgraded to guarded and the decision was made  to transfer her to the CCU at [facility]. En route she received another 20  mg of Lasix IV. The patient has a recent past medical history of an EGD with  multiple AVMs that were fulgurated in August, 2006 by Dr. [name] Plenty of  gastroenterology. She has had recurrent need for blood transfusion secondary  to GI blood loss.   PAST MEDICAL HISTORY:  1.  Otherwise significant for insulin-dependent diabetes mellitus type 2.  2.  Hyperlipidemia.  3.  Chronic iron-deficiency anemia.   FAMILY HISTORY:  History of a brother with myocardial infarction, three  sisters with Alzheimer's. A sister with breast cancer. No history of  strokes.   ALLERGIES:  ASPIRIN (causes severe GI upset with vomiting).   CURRENT HOME MEDICATIONS:  1.  Enalapril 5 mg daily.  2.  Ferrous sulfate 325 mg b.i.d.  3.  Lantus Insulin 44 units SQ q.h.s.  4.   Metformin 500 mg b.i.d.  5.  Simvastatin 20 mg daily.   SOCIAL HISTORY:  She smoked 3 cigarettes a day in the distant past but has  not smoked for 30 years per her report. She does not drink or use drugs. She  has no children.   REVIEW OF SYSTEMS:  The patient has positive weakness and fatigue. She  denies any chest pain. She is currently having symptoms of shortness of  breath and wheezing per her report. GI:  A history of heme positive stools.  She described mild stomach upset but not pain. She denies fevers, chills,  nausea, vomiting, bright red blood per rectum or obvious melena. She denies  any urinary symptoms. She has a history of chronic rashes and uses creams  from time to time. Review of systems otherwise negative.   PHYSICAL EXAMINATION:  VITAL SIGNS:  Blood pressure is 112/44, pulse 105,  pulse oximetry is 95% on 4 liters of nasal cannula.  GENERAL:  Well-developed, well-nourished  African-American female who has  some accessory muscle use with breathing and she has some occasional  grunting with expiration. She alert and oriented x3.  HEENT:  Normocephalic, atraumatic. Pupils are equal, round and reactive to  light and accommodation. Extraocular muscles are intact.  CHEST:  Rises equal bilaterally.  LUNGS:  She has bilateral crackles and scattered wheezes bilaterally. She  has good air exchange.  HEART:  Regular rate and rhythm.  ABDOMEN:  Obese, soft, nontender, nondistended. Positive bowel sounds.  EXTREMITIES:  No clubbing, cyanosis or edema.  RECTAL EXAM:  Heme positive per [facility] staff.  SKIN:  Well-healed scars on her legs bilaterally.   LABORATORY DATA:  Her initial hemoglobin was 6.1 and after one unit repeat  hemoglobin here at [facility] was 8.1. Her EKG showed normal sinus  rhythm with possible left atrial enlargement but no ST segment elevation or  depression on my read.   Other laboratory studies show an MCV of 79, normal platelets of 212,  white  blood  count of 6200 here at [facility] but 10.4 thousand at [facility].  Her INR is 1.1.  Her complete metabolic panel was significant for an elevated blood glucose  of 175, elevated protein of 5.2, and low albumen of 2.9, otherwise within  normal limits. Her creatinine kinase was 74. CK-MB was 0.9. Troponin I was  0.02 (negative). Urinalysis was negative for urinary tract infection.   ASSESSMENT/PLAN:  Major problem 1. Symptomatic anemia. Hemoglobin increased  after one unit of packed red blood cells. Blood work was drawn to evaluate  for possible transfusion reaction. Will hold further transfusions and  consult Dr. [name] Plenty of gastroenterology in the morning.   Major problem 2. Shortness of breath. Appears to be fluid overload. Portable  chest x-ray is pending. She has had Lasix 20 mg x2. Will away x-ray results  and consider an additional dose of Lasix. Will not use albuterol until we  evaluate for possible myocardial infarction due to significant anemia.   Major problem 3. Rule out myocardial infarction. Cardiac enzymes x3 are  pending. EKG initially is negative. Repeat in the morning.   Major problem 4.  Insulin-dependent diabetes mellitus type 2. Continue  Lantus at 30 units nightly, decreased from her home dose of 44 units and  will place the patient on a 2200 kilo calorie diet with fasting morning and  prior to dinner finger stick blood sugars.   Major problem 5. Hyperlipidemia. Will continue Zocor 20 mg nightly.   The patient agrees to the plan of care. Respiratory therapy was consulted to  evaluate her respiratory status and will monitor patient, keeping her on at  least 2 liters by nasal cannula. Will titrate to keep her O2 saturations  greater than 92%.          ______________________________  Malena Catholic, M.D.    CMB/MEDQ  D:  10/25/2004  T:  10/25/2004  Job:  [id number]

## 2010-06-27 NOTE — Op Note (Signed)
NAMEJONNELL, [name] NO.:  [id number]   MEDICAL RECORD NO.:  [id number]          PATIENT TYPE:  INP   LOCATION:  5737                         FACILITY:  [place]   PHYSICIAN:  Wonda Horner, M.D.   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   ENDOSCOPIST:  Wonda Horner, M.D.   INDICATIONS FOR PROCEDURE:  History of multiple atriovenous malformations,  history of anemia.   Informed consent was obtained after explanation of the risks of bleeding,  infection and perforation.   PREMEDICATIONS:  Fentanyl 40 mcg IV, Versed 4 mg IV.   DESCRIPTION OF PROCEDURE:  Prior to the procedure, the patient had an nose  bleed. This was stopped by compression of the nose.  The Olympus gastroscope  was then inserted into the oropharynx and passed into the esophagus.  It was  advanced down the esophagus and into the stomach and into the duodenum.  The  second portion and bulb of the duodenum looked normal.  The stomach showed  scattered multiple AVMs in the body as well as up at the  fundus and cardia  of the stomach.  The argon plasma coagulator was used to cauterize these  multiple AVMs.  This was very difficult due to the positioning of some of  the AVMs in the high fundus and cardia and only partial cauterization of  some of these was obtained.  It was also extremely difficult because of the  patient's respirations; it was hard to keep the probe in proper location  because of the persistent ongoing marked movement of the stomach due to  respirations.  The esophagus looked normal.  She tolerated this procedure  well without immediate complications.   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           ______________________________  Wonda Horner, M.D.     SFG/MEDQ  D:  10/27/2004  T:  10/28/2004  Job:  [id number]   cc:   Malena Catholic, M.D.

## 2010-06-27 NOTE — Discharge Summary (Signed)
NAME:  [name], [name]                          ACCOUNT NO.:  [id number]   MEDICAL RECORD NO.:  [id number]                   PATIENT TYPE:  INP   LOCATION:  [id number]                                 FACILITY:  [facility]   PHYSICIAN:  Beatrice Lecher, M.D.            DATE OF BIRTH:  03-20-1940   DATE OF ADMISSION:  12/31/2001  DATE OF DISCHARGE:                                 DISCHARGE SUMMARY   DATE OF BIRTH:  [birth year].   DISCHARGE DIAGNOSES:  1. 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  2. Chronic anemia.  3. History of hypertension.  4. Diabetes mellitus type 2.  5. Fever.  6. Hypercholesterolemia.  7. Skin lesions.   DISCHARGE MEDICATIONS:  1. Lantus 24 units q.h.s.  2. Colace 100 mg one p.o. q.d.  3. Iron pill 325 mg p.o. t.i.d.  4. Vitamin C one tablet p.o. t.i.d.  5. Senna can take two tablets q.h.s. PRN for constipation.  6. Patient is instructed to hold her blood pressure pill until she sees her     doctor who may restart it at that time.   DIET:  Diabetic diet.   SPECIAL DISCHARGE INSTRUCTIONS:  Patient should have hemoglobin checked next  week and once per month.   FOLLOW UP:  Follow up will be at Urgent Care.   HOSPITAL COURSE:  #1 - GASTROINTESTINAL BLEED:  The patient came into the  hospital complaining of two weeks of weakness and was unable to walk and  getting very short of breath.  Patient had a tightness in her chest.  Hemoglobin upon admission was 5.6.  The patient was transfused 4 units and  did well.  Her discharge hemoglobin was 11.1.  The patient has had multiple  colonoscopies in the past showing that she does have telangiectasias/AVM's  of the intestine.  This is most likely the source of her bleed.  Gastroenterology was consulted and they did not want to repeat a colonoscopy  at that time.   #2 - HYPERTENSION:  The patient has a history of hypertension and says that  she takes a hypertensive pill at home once a day. Here she  was normotensive  and thus she was discharged and told not to take her blood pressure  medication until she was restarted by her primary care physician.   #3 - DIABETES MELLITUS, TYPE 2:  The patient does have a history of  diabetes.  She has restarted her Lantus.  The patient continues to have  CBG's in the 200's.  The patient evidently does check her fingerstick  glucose three to four times per day and normally runs in the 200's at home.  We discussed that the patient's goal should be in the 100's at least and we  increased her Lantus to 24 units q.h.s.  The patient is also to follow a  diabetic diet.   #4 - FEVER:  The patient did have  a fever and intermittent shortness of  breath after receiving her 4th unit of blood.  The patient was given Lasix  and diuresed and her shortness of breath resolved.  Her fever has also  resolved.   #5 - HYPERCHOLESTEROLEMIA:  The patient reports that she has a history of  high cholesterol but as far as we can tell she is not on any medications for  this.  This should be started as an outpatient once the patient has  recovered from this acute illness.   #6 - SKIN LESIONS:  The patient has skin lesions of unknown etiology.  The  patient reports that these appeared the last time she was admitted back in  January for a blood transfusion.  These are plaque-like, very circular and  with some dry scaling.  They are nontender, nonpruritic, and are  hyperpigmented.  They extend bilaterally over upper and lower part of her  legs.   #7 - CHRONIC ANEMIA:  The patient does have chronic anemia from her chronic  gastrointestinal blood loss, thus she is on iron daily and a stool softener  with her iron therapy.                                               Beatrice Lecher, M.D.    CM/MEDQ  D:  01/02/2002  T:  01/02/2002  Job:  [id number]   cc:   Deborah Chalk, M.D.  [address], [address]  [address]  Fax: [phone]    Copy to gastroenterologist

## 2010-06-27 NOTE — Procedures (Signed)
[facility]  Patient:    [name], [name]                       MRN: [id number] Adm. Date:  [id number] Attending:  Jim Desanctis                           Procedure Report  PROCEDURE:  Colonoscopy.  INDICATIONS:  Heme positivity.  ANESTHESIA:  Demerol additional 20 mg and Versed 2 additional mg.  PROCEDURE:  With the patient mildly sedated in the left lateral decubitus position, the Olympus videoscopic colonoscope was inserted in the rectum and passed under direct vision to the cecum.  The cecum was identified by the ileocecal valve and appendiceal orifice, both of which were photographed. From this point, the colonoscope was slowly withdrawn, taking circumferential views of the entire colonic mucosa, stopping only in the rectum, which appeared normal in direct view and also in retroflexed view.  The endoscope was straightened and withdrawn.  The patients vital signs and pulse oximeter remained stable.  The patient tolerated the procedure well without apparent complications.  FINDINGS:  Essentially negative examination.  PLAN: See endoscopy note. DD:  04/01/00 TD:  04/02/00 Job: [id number]

## 2010-06-27 NOTE — Consult Note (Signed)
[name], [name]                ACCOUNT NO.:  [id number]   MEDICAL RECORD NO.:  [id number]          PATIENT TYPE:  INP   LOCATION:  5737                         FACILITY:  [facility]   PHYSICIAN:  Raelyn Ensign. 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 She entered the hospital again  anemic with a hemoglobin of 6.1 and was transfused to 8.1; however, with  transfusion she developed pulmonary edema.  Currently her respiratory status  is improved.  She has been feeling fatigue and generalized weakness  recently.  Among her many endoscopic studies are a 1997 upper endoscopy, a  January 2003 upper endoscopy, and a July 2006 upper endoscopy which all  showed gastric angiodysplasias which were cauterized at the last endoscopy.  She had a negative colonoscopy in 1997 and in 2002 and 2003.  She had a  video capsule endoscopy in June 2003 which showed multiple gastric  angiodysplasia. There was a question of a small knot of jejunal bleeding.   PAST MEDICAL HISTORY:  1.  Insulin-dependent diabetes mellitus, type 2.  2.  Hyperlipidemia.  3.  Chronic anemia.  4.  Transfusion-induced pulmonary edema on this admission.   CURRENT OUTPATIENT MEDICATIONS:  1.  Enalapril 5 mg daily.  2.  Ferrous sulfate 325 mg b.i.d.  3.  Lantus insulin 44 units nightly.  4.  Metformin 500 mg b.i.d.  5.  Zocor 20 mg daily.   In hospital currently, she is on:  1.  Avelox 400 mg daily.  2.  Sliding scale insulin.   ALLERGIES:  ASPIRIN.   FAMILY HISTORY:  Noncontributory.   SOCIAL HISTORY:  Nonsmoker, nondrinker.   PHYSICAL EXAMINATION:  GENERAL: She is in no acute distress.  VITAL SIGNS:  Stable vital signs.  HEENT:  Eyes  are anicteric.  Conjunctivae extremely pale.  Oropharynx  unremarkable.  CHEST:  Lungs clear anteriorly, heart sounds distant. Regular rate and  rhythm.  ABDOMEN:  Obese and soft without mass, tenderness, or organomegaly.  EXTREMITIES:  Have diffuse scarring rash of unclear etiology.   LABORATORY TESTS:  Hemoglobin 8.1, MCV 79, white blood count 16.1.  Prothrombin time 14.1, INR 1.1. BUN 19, creatinine 0.7.   Chest x-ray: Pulmonary edema.   IMPRESSION:  Elderly female with apparent recurrent gastrointestinal  bleeding from mostly gastric arteriovenous malformations.  Her respiratory  status is currently improved from her transfusion-related pulmonary edema.  I do believe she probably could tolerate a repeat endoscopy which would be  reasonable to try to cauterize or eradicate with argon plasma coagulation  the residual arteriovenous malformations in the stomach and possibly  duodenum.  If she has further bleeding, consideration should be given to  repeating her video capsule endoscopy to see if she has developed new  angiodysplasia of the distal small bowel.   PLAN:  Upper endoscopy  is discussed with the patient in terms of technique,  preparation, risks of complications to include bleeding and perforation.  She agrees to proceed and will be scheduled for tomorrow morning probably  with Dr. Penelope Coop.  Please see the orders.      Raelyn Ensign. Vladimir Faster, M.D.  Electronically Signed     PJS/MEDQ  D:  10/26/2004  T:  10/26/2004  Job:  [id number]   cc:   Elyse Jarvis. [name] Plenty, M.D.  [id number] [place]  [place] [address]  Fax: [phone]   Malena Catholic, M.D.

## 2010-06-27 NOTE — Discharge Summary (Signed)
[name], [name] NO.:  [id number]   MEDICAL RECORD NO.:  [id number]          PATIENT TYPE:  INP   LOCATION:  5705                         FACILITY:  [place]   PHYSICIAN:  Dixon Boos, M.D.       DATE OF BIRTH:  [birth year]   DATE OF ADMISSION:  05/12/2006  DATE OF DISCHARGE:  05/13/2006                               DISCHARGE SUMMARY   PRIMARY CARE PHYSICIAN:  Marzetta Merino, M.D., at [facility].   PRIMARY GASTROENTEROLOGIST:  Elyse Jarvis. [name] Plenty, M.D., at [facility].   DISCHARGE DIAGNOSES:  1. Anemia secondary to chronic blood loss from known history of      multiple arteriovenous malformations.  2. Hypertension.  3. Diabetes.  4. Hyperlipidemia.   DISCHARGE MEDICATIONS:  1. Enalapril 5 mg p.o. daily.  2. Iron sulfate 325 mg p.o. three times daily.  3. Lantus insulin 46 units subcutaneously each night.  4. Prilosec OTC 820 mg p.o. b.i.d.  5. Zocor 40 mg p.o. daily.   PROCEDURE:  The patient received 2 units of packed red blood cells  without complication.   CONSULTS:  None.   FOLLOW-UP INSTRUCTIONS:  [name] will see Dr. [name] Plenty at his office  tomorrow at 2 o'clock in the afternoon.  Records have been faxed to his  office for reference.  She was also instructed to call to make an  appointment with her primary physician, Dr. Marzetta Merino, at [facility].  She was also instructed to return to the  emergency department for any severe dizziness, confusion and having more  than five or six bowel movements in a day or vomiting blood.  She  understands these precautions.   HOSPITAL COURSE:  This is a 70 year old African American female who  presented to [facility] with a complaint that her  stomach had been feeling funny for one day like it usually does when her  blood count is low.  Hemoglobin was found to be 7.8 and had dropped  almost 4 units in just 7 days.  Because there  was no outpatient facility  available for a 48-hour period for transfusion, she was admitted to  [facility] for infusion of 2 units of packed red blood cells.  She tolerated this procedure well.  Her hemoglobin at the time of  discharge is 10.1.  She denied any abdominal pain currently and has no  complaints and is anxious to be discharged.  There have been no changes  to her medication regimen otherwise.           ______________________________  Dixon Boos, M.D.     MJ/MEDQ  D:  05/13/2006  T:  05/13/2006  Job:  [id number]   cc:   Elyse Jarvis. [name] Plenty, M.D.

## 2010-06-27 NOTE — Op Note (Signed)
NAME:  [name], [name]                ACCOUNT NO.:  [id number]   MEDICAL RECORD NO.:  [id number]          PATIENT TYPE:  AMB   LOCATION:  ENDO                         FACILITY:  [facility]   PHYSICIAN:  [name] C. [name] Plenty, M.D.    DATE OF BIRTH:  08/30/1940   DATE OF PROCEDURE:  01/21/2006  DATE OF DISCHARGE:                               OPERATIVE REPORT   PROCEDURE PERFORMED:  Colonoscopy.   INDICATIONS FOR PROCEDURE:  Anemia and heme-positive stools.   PROCEDURE:  The patient was placed in the left lateral decubitus  position and placed on the pulse monitor with continuous low flow oxygen  delivered by nasal cannula.  She was sedated with 37.5 mcg IV fentanyl  and 3 mg IV Versed in addition to the medicine given at the previous  EGD.  The Olympus video colonoscope was inserted into the rectum and  advanced to cecum, confirmed by transillumination of McBurney's point  and visualization of the ileocecal valve and appendiceal orifice.  The  prep was good.  The cecum, ascending, transverse, descending, sigmoid,  and rectum all appeared normal with no masses, polyps, diverticula or  other mucosal abnormalities. The rectum, likewise, appeared normal.  Retroflexed view of the anus revealed no obvious internal hemorrhoids.  The scope was then withdrawn and the patient returned to the recovery  room in stable condition.  She tolerated the procedure well.  There were  no immediate complications.   IMPRESSION:  Normal colonoscopy.   PLAN:  Repeat colonoscopy for screening purposes in ten years.           ______________________________  Elyse Jarvis [name] Plenty, M.D.     JCH/MEDQ  D:  01/21/2006  T:  01/21/2006  Job:  [id number]   cc:   Manus Rudd, MD

## 2010-06-27 NOTE — Discharge Summary (Signed)
NAMEROBBIN, MONTAGNA                ACCOUNT NO.:  [id number]   MEDICAL RECORD NO.:  [id number]          PATIENT TYPE:  INP   LOCATION:  6731                         FACILITY:  [facility]   PHYSICIAN:  Blane Ohara McDiarmid, M.D.DATE OF BIRTH:  03-Dec-1940   DATE OF ADMISSION:  02/05/2005  DATE OF DISCHARGE:  02/06/2005                                 DISCHARGE SUMMARY   DIAGNOSES:  1.  Iron deficiency anemia.  2.  Hyperlipidemia.  3.  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  4.  Diabetes mellitus.   PROCEDURES:  1.  Transfuse 2 units packed red blood cells.  2.  IV iron transfusion.  3.  EKG showing normal sinus rhythm, non-specific T wave changes.   DISCHARGE MEDICATIONS:  1.  Enalapril 5 mg daily.  2.  Iron sulfate 325 mg three times daily.  3.  Lantus 46 units at night.  4.  Metformin 500 mg twice daily.  5.  Zocor 20 mg daily.  6.  Protonix 40 mg daily.   HISTORY OF PRESENT ILLNESS:  The patient is a 70 year old female with  history of chronic GI bleed secondary to multiple mucosal telangiectasias  throughout her bowel that presented with increasing weakness and anemia  symptoms.   HOSPITAL COURSE:  She was admitted and transfused secondary to hemoglobin of  6.1. She had 2 units of packed red blood cells. She was further treated with  IV iron to help restore her depleted iron stores. Dr. Cristina Gong was consulted  and considered inpatient endoscopy but decided to do that in the outpatient  setting. She was stable at discharge with close followup from Dr. Judi Cong and  Dr. [name] Plenty, her GI doctor. Her hemoglobin improved at discharge.   LABORATORY DATA:  Admit hemoglobin 6.1. INR 1.1. PTT 31. Electrolytes  normal. Discharge hemoglobin 8.3.   FOLLOW UP:  1.  The patient will followup with Manus Rudd at the [facility]. Call for an appointment.  2.  The patient will followup with Dr. [name] Plenty at [place] on February 17, 2005      at 2:15 p.m.   CONDITION ON DISCHARGE:  Stable.      Druscilla Brownie, M.D.    ______________________________  Blane Ohara McDiarmid, M.D.    AL/MEDQ  D:  03/28/2005  T:  03/29/2005  Job:  [id number]   cc:   Manus Rudd, MD  Fax: [phone]   Ronald Lobo, M.D.  Fax: [phone]   Elyse Jarvis. [name] Plenty, M.D.  Fax: [phone]

## 2010-06-27 NOTE — Consult Note (Signed)
[name], [name] NO.:  [id number]   MEDICAL RECORD NO.:  [id number]          PATIENT TYPE:  INP   LOCATION:  [id number]                         FACILITY:  [facility]   PHYSICIAN:  [name], M.D.   DATE OF BIRTH:  [birth year]   DATE OF CONSULTATION:  02/05/2005  DATE OF DISCHARGE:                                   CONSULTATION   The family practice teaching service asked [place] to see this pleasant 70-year-  old female because of recurrent anemia.   [name] is well known to our service. She was originally found to have  gastroduodenal AVMs by Dr. Teena [name] in January 2003, at which time  colonoscopy was essentially negative. She had anemia and heme-positive stool  at that time.   She apparently then did well until March of this year when she presented  with severe anemia, with a drop in hemoglobin of roughly 4 g over a one  month time to a level of 7. She was scoped by me at that time and noted to  have several small AVMs which I elected simply to observe for fear that  attempt at cautery would result in stirring up more trouble than it would  solve. However, within several months, the patient had endoscopy by Dr.  [name] [name] in July of this year and at that time attempted BICAP cautery of the  lesions was undertaken, although compromised by technical difficulties and  the patient respiratory motion. Then, in September, the patient re-presented  with severe anemia, hemoglobin around 6, again without any frank GI  bleeding. She was endoscoped and treated with the Argon plasma coagulator by  Dr. Acquanetta [name]. She then apparently did well over the ensuing several months  until recently when she developed severe weakness. She has not had any  dyspeptic symptomatology or abdominal pain, no obvious change in bowel  habits or frank GI bleeding, but her hemoglobin has dropped from 9.6 on  October 29, 2004, to 6.1. Note that BUN, platelets and INR are all normal.  The  patient's stool was hemoccult negative on exam in the emergency room by  the [facility] resident, although there was just a Kapur bit of mucoid  residue present, not really a true stool specimen.   Pertinent for type 2 diabetes, hypertension, hypercholesterolemia and  history of pulmonary edema during a blood transfusion earlier this year.   PHYSICAL EXAMINATION:  A pleasant, somewhat overweight African-American  female in absolutely no distress. She is anicteric. Her palms are very pale  and in fact have a somewhat yellow, sallow complex systolic ejection murmur.  The abdomen is without frank mass or tenderness.   LABORATORY DATA:  See HPI. Note that the patient's MCV is 70, consistent  with iron deficiency. This is despite the fact that take the patient takes.   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 The fact that she has progressive anemia  with microcytosis despite oral iron supplementation suggests that she may be  losing blood faster than  she can produce it, absorption by Protonix therapy.   RECOMMENDATIONS:  1.  Probable endoscopic evaluation tomorrow with treatment of her AVMs, if      the schedule permits. If not, once the patient is transfused, she could      be discharged and the procedure could be done as an outpatient.  2.  The patient indicates supply side treatment by enhancing her iron      status. Hematology consultation with a      bone marrow examination might be helpful, and in any event a trial of      intravenous iron may be worthwhile to try to replete iron stores and      enhance the patient's ability to manufacture red cells, thereby reducing      her dependence on her stopping PPI therapy which may interfere with iron      absorption.           ______________________________  [name], M.D.     RB/MEDQ  D:  02/05/2005  T:  02/06/2005  Job:  [id number]   cc:   [name] Rudd, MD  Fax: [phone]

## 2010-06-27 NOTE — Op Note (Signed)
NAME:  [name], [name]                ACCOUNT NO.:  [id number]   MEDICAL RECORD NO.:  [id number]          PATIENT TYPE:  AMB   LOCATION:  ENDO                         FACILITY:  [facility]   PHYSICIAN:  [name] C. [name] Plenty, M.D.    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   PROCEDURE:  The patient was placed in the left lateral decubitus position  and placed on the pulse monitor with continuous low-flow oxygen delivered by  nasal cannula. She was sedated 100 mcg IV fentanyl and 8 milligrams IV  Versed. Olympus video endoscope was advanced under direct vision into the  oropharynx and esophagus. Esophagus was straight and of normal caliber with  squamocolumnar line at 38 cm.  There was no visible hiatal hernia, ring,  stricture or other abnormalities of the GE junction. Stomach was entered and  small amount of liquid secretions were suctioned from the fundus.  Retroflexed view of cardia was unremarkable. There were a few scattered AVMs  in the stomach approximately six and one prominent one in the duodenal bulb.  I addressed the one in the duodenal bulb first, applying a BICAP problem,  but the BICAP probe did not cauterize properly and contact with the AVM  caused it to bleed more.  We had to use two or three different probes to  find one that would apply adequate cautery.  By this time the patient was  somewhat restless, but we were eventually able to cause cessation of  bleeding of the duodenal AVM.  I tried to address some of the gastric AVMs  but a lot of these proved to be a difficult tangential lie to the to the  BICAP probe. There was a lot of respiratory movement and there continued to  be problems with the BICAP  probe not delivering enough cautery and each time  the BICAP probe would be applied it would result in bleeding although the  bleeding easily proved to be self limiting.  Ultimately the patient was  becoming restless and I only attempted to fulgurate about three or four out  of at least seven or eight AVMs seen.  The scope was then withdrawn and the  patient returned to the recovery room in stable condition. She tolerated the  procedure fairly well. There were no immediate complications.   IMPRESSION:  1.  Gastric and duodenal AVMs, status post partially successful BiPap      therapy but without ideal obliteration of the AVMs seen as initially      planned.   PLAN:  Will see how her hemoglobin responds to today's therapy.  If she  continues was blood, may consider repeat EGD with another method of  fulguration such as ERBE argon plasma coagulator laser therapy.       JCH/MEDQ  D:  09/01/2004  T:  09/01/2004  Job:  [id number]   cc:   Dr. Florene Route

## 2010-06-27 NOTE — Procedures (Signed)
[facility]  Patient:    [name], [name] Visit Number: [id number] MRN: [id number]          Service Type: END Location: ENDO Attending Physician:  Rafael Bihari Dictated by:   Elyse Jarvis [name] Plenty, M.D. Proc. Date: 02/18/01 Admit Date:  02/18/2001   CC:         Harlene Ramus, M.D.   Procedure Report  PROCEDURE:  Esophagogastroduodenoscopy.  INDICATION FOR PROCEDURE:  Heme-positive stools and anemia.  DESCRIPTION OF PROCEDURE:  The patient was placed in the left lateral decubitus position and placed on the pulse monitor with continuous low-flow oxygen delivered by nasal cannula.  She was sedated with 50 mg IV Demerol and 5 mg IV Versed.  The Olympus video endoscope was advanced under direct vision into the oropharynx and esophagus.  The esophagus was straight and of normal caliber with the squamocolumnar line at 38 cm.  There was no visible hiatal hernia, ring, stricture, or other abnormalities of the GE junction.  The stomach was entered, and a small amount of liquid secretions were suctioned from the fundus.  Retroflexed view of the cardia was unremarkable.  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  There was also about 5-6 small, nonbleeding AVMs seen scattered throughout the stomach.  No other focal abnormalities were noted.  The duodenum was entered, and both the bulb and second portion were well-inspected and appeared to be within normal limits with the exception of a single, nonbleeding AVM in the duodenal bulb.  The scope was withdrawn back into the stomach and CLOtest obtained.  The scope was then withdrawn, and the patient returned to the recovery room in stable condition.  She tolerated the procedure well, and there were no immediate complications.  IMPRESSION: 1. Antral gastritis. 2. Gastric and duodenal arteriovenous malformations.  PLAN: 1. Await CLOtest and  treat for eradication if Helicobacter positive. 2. Proceed with colonoscopy. 3. If significant acute or subacute gastrointestinal blood loss in the future    and no colonic abnormalities on todays procedure, may need laser treatment    of the arteriovenous malformations. Dictated by:   Elyse Jarvis [name] Plenty, M.D. Attending Physician:  Rafael Bihari DD:  02/18/01 TD:  02/19/01 Job: [id number]

## 2010-06-27 NOTE — Op Note (Signed)
NAME:  [name], [name]                ACCOUNT NO.:  [id number]   MEDICAL RECORD NO.:  [id number]          PATIENT TYPE:  AMB   LOCATION:  ENDO                         FACILITY:  [place]   PHYSICIAN:  [name] C. [name] Plenty, M.D.    DATE OF BIRTH:  1940-06-05   DATE OF PROCEDURE:  01/21/2006  DATE OF DISCHARGE:                               OPERATIVE REPORT   PROCEDURE:  Esophagogastroduodenoscopy with laser therapy.   INDICATIONS FOR PROCEDURE:  Recent drop in hemoglobin requiring  transfusion in a patient with chronic GI bleeding due to known AVMs in  the stomach and small intestine.  She is also close to being due for  colonoscopy with last colonoscopy nearly 5 years ago.   PROCEDURE:  The patient was placed in the left lateral decubitus  position and placed on the pulse monitor with continuous low-flow oxygen  delivered by nasal cannula.  She was sedated 62.5 mcg IV fentanyl and 7  mg IV Versed.  The Olympus video endoscope was advanced under direct  vision into the oropharynx and esophagus.  The esophagus was straight  and of normal caliber with the squamocolumnar line at 38 cm.  There was  no definite hiatal hernia, ring, stricture or other abnormality of the  GE junction or esophagus.  The stomach was entered and a small amount of  liquid secretions were suctioned from the fundus.  There were  approximately 6 AVMs scattered in various locations within the stomach  of at least 2-3 mm in diameter and a few slightly smaller.  None of  these were actively bleeding.  The larger ones were all cauterized with  the argon plasma coagulator.  This resulted in initial bleeding but it  was self-limited in all cases.  The duodenum was entered and both the  bulb and second portion were well-inspected and appeared to be within  normal limits and no duodenal AVMs were seen.  The scope was then  withdrawn and the patient prepared for colonoscopy.  She tolerated the  procedure well.  There were no immediate  complications.   IMPRESSION:  Multiple gastric arteriovenous malformations, status post  laser therapy.   PLAN:  Continue to monitor stools and hemoglobin for ongoing bleeding.           ______________________________  Elyse Jarvis. [name] Plenty, M.D.     JCH/MEDQ  D:  01/21/2006  T:  01/21/2006  Job:  [id number]   cc:   Manus Rudd, MD

## 2010-06-27 NOTE — Procedures (Signed)
[facility]  Patient:    [name], [name] Visit Number: [id number] MRN: [id number]          Service Type: END Location: ENDO Attending Physician:  Rafael Bihari Dictated by:   Elyse Jarvis [name] Plenty, M.D. Proc. Date: 02/18/01 Admit Date:  02/18/2001   CC:         Harlene Ramus, M.D.   Procedure Report  PROCEDURE:  Colonoscopy with biopsy.  INDICATION FOR PROCEDURE:  Heme-positive stools and anemia.  DESCRIPTION OF PROCEDURE:  The patient was placed in the left lateral decubitus position and placed on the pulse monitor with continuous low-flow oxygen delivered by nasal cannula.  She was sedated with 20 mg of IV Demerol and 1 mg of IV Versed.  The Olympus video colonoscope was inserted into the rectum and advanced to the cecum, confirmed by transillumination at McBurneys point and visualization of the ileocecal valve and appendiceal orifice.  The prep was excellent.  The cecum appeared normal with no AVMs seen.  They were carefully searched given AVMs seen in the stomach and duodenum on the previous EGD.  The cecum, ascending, transverse, descending, and sigmoid colon all appeared normal with no masses, polyps, diverticula or other mucosal abnormalities.  Within the rectum, there were seen a few small, translucent sessile polyps, no larger than 6 mm in diameter, consistent with probable hyperplastic polyps.  Two of the larger ones were hot biopsied.  The scope was then withdrawn and the patient returned to the recovery room in stable condition.  She tolerated the procedure well, and there were no immediate complications.  IMPRESSION: 1. Small hyperplastic colon polyps. 2. Otherwise normal colonoscopy.  PLAN:  Await biopsy results. Dictated by:   Elyse Jarvis [name] Plenty, M.D. Attending Physician:  Rafael Bihari DD:  02/18/01 TD:  02/19/01 Job: [id number]

## 2010-06-27 NOTE — Op Note (Signed)
NAME:  [name], [name]                ACCOUNT NO.:  [id number]   MEDICAL RECORD NO.:  [id number]          PATIENT TYPE:  AMB   LOCATION:  ENDO                         FACILITY:  [place]   PHYSICIAN:  [name] C. [name] Plenty, M.D.    DATE OF BIRTH:  1940/11/18   DATE OF PROCEDURE:  05/19/2006  DATE OF DISCHARGE:                               OPERATIVE REPORT   INDICATIONS FOR PROCEDURE:  Recurrent GI bleeding with known gastric and  duodenal AVMs.   PROCEDURE:  The patient was placed in the left lateral decubitus  position and placed on the pulse monitor with continuous low-flow oxygen  delivered by nasal cannula.  She was sedated with 75 mcg IV fentanyl and  8 mg IV Versed.  Olympus video endoscope was advanced under direct  vision into the oropharynx and esophagus.  The esophagus was straight  and of normal caliber with squamocolumnar line at 38 cm.  There is no  visible hiatal hernia, ring stricture or other abnormalities of the GE  junction.  The stomach was entered, and a small amount of liquid  secretions were suctioned from the fundus.  Retroflexed view of cardia  was unremarkable.  The fundus, body and antrum showed a few scattered  AVMs which were fulgurated by ERBE.  The duodenum was entered in the  second portion.  There were 2 or 3 AVMs.  These were difficult to  address due to the tangential line as well as respiratory motion and  nearly continuous peristalsis.  There was one large one I could not  address but two smaller ones were fulgurated.  No other abnormalities  were noted.  The scope was then withdrawn, and the patient returned to  the recovery room in stable condition.  He tolerated the procedure well.  There were no immediate complications.   IMPRESSION:  Gastric and duodenal arteriovenous malformations.   PLAN:  Repeat esophagogastroduodenoscopy p.r.n. with laser therapy for  recurrent bleeding.           ______________________________  Elyse Jarvis. [name] Plenty, M.D.     JCH/MEDQ  D:  05/19/2006  T:  05/19/2006  Job:  [id number]   cc:   [name]

## 2010-06-27 NOTE — Op Note (Signed)
NAMEASHLEYANNE, [name]                ACCOUNT NO.:  [id number]   MEDICAL RECORD NO.:  [id number]          PATIENT TYPE:  INP   LOCATION:  5524                         FACILITY:  [place]   PHYSICIAN:  Ronald Lobo, M.D.   DATE OF BIRTH:  1940-06-19   DATE OF PROCEDURE:  04/25/2004  DATE OF DISCHARGE:                                 OPERATIVE REPORT   PROCEDURE:  Upper endoscopy.   ENDOCRINE:  Ronald Lobo, M.D.   INDICATION:  Mrs. Mahin is a 70 year old African American female who was  admitted to the hospital yesterday with melenic stool and progressive  anemia.  In fact, her hemoglobin got down to 7.0 this morning, but on  admission, her BUN was normal at 15 (creatinine 0.6).  She is also iron  deficient, with 7% iron saturation and a low ferritin level of 7 (normal 10-  291).   Because of a prior history of gastroduodenal AVMs having been noted by my  partner, Dr. [name] Plenty, on endoscopy in January 2003, endoscopic evaluation was  felt to be warranted.  Note that the patient has had colonoscopic evaluation  around that same time that was unrevealing.   PROCEDURE:  The purpose and risks of the procedure had been discussed with  the patient, who provided written consent.  She was brought from her  hospital room to the endoscopy unit and a fasted state.  Sedation was  fentanyl 50 mcg and Versed 5 mg IV, without arrhythmias or desaturation.   The Olympus small-caliber adult video endoscope was passed under direct  vision.  The vocal cords were not well seen.   The esophagus was endoscopically normal, without evidence of reflux  esophagitis, Barrett's esophagus, varices, infection or neoplasia, or any  ring, stricture or hiatal hernia.   The stomach was entered.  Interestingly, floating on a small pool of clear  gastric fluid were several fresh red flecks and strands of blood.   In the gastric lumen, I counted approximately 7 vascular malformations, each  measuring about 3-4 mm  across.  One of them was oozing minimally at the time  of this exam.  No gastritis, erosions, ulcers, polyps or masses were noted  including retroflexed viewing of cardia, which showed a small hiatal hernia  from the inferior perspective.   The pylorus was normal, as was the duodenal bulb, but the second and third  portions of the duodenum had again several small vascular malformations, 1  of which again was oozing a minimal amount.   It was felt that attempts at endoscopic therapy, in the absence of a  dominant vascular lesion, would probably be futile and may actually  exacerbate the problem.  Therefore, the scope was removed from the patient.  No interventions were undertaken.  The patient tolerated the procedure well  and there were no apparent complications.   IMPRESSION:  1.  Multiple gastroduodenal vascular ectasia which probably account for the      patient's anemia and heme-positive/melenic stool.  Given the paucity of      blood seen on the current examination, it is possible that  similar      lesions located throughout the gastrointestinal tract, out of reach of      the scope, may be responsible.  2.  Posthemorrhagic anemia.  3.  Iron deficiency anemia, progressive.   PLAN:  1.  Clinical monitoring.  2.  It is possible that the patient has melenic stools actually reflect      iron therapy rather than any subacute GI bleeding at this time, although      the progressive drop in hemoglobin from 11 to 8.5 over the past month      would suggest that there is probably an element of subacute-on-chronic      bleeding.  3.  Consider small bowel capsule endoscopy, particularly if the patient has      ongoing problems with blood loss.      RB/MEDQ  D:  04/25/2004  T:  04/26/2004  Job:  [id number]   cc:   Billey Chang, M.D.  Fax: [place]

## 2010-06-27 NOTE — H&P (Signed)
NAME:  GAITLIN, STEVE                          ACCOUNT NO.:  [id number]   MEDICAL RECORD NO.:  [id number]                   PATIENT TYPE:  INP   LOCATION:  4738                                 FACILITY:  [facility]   PHYSICIAN:  Vianne Bulls, M.D.                      DATE OF BIRTH:  12/05/1940   DATE OF ADMISSION:  12/31/2001  DATE OF DISCHARGE:                                HISTORY & PHYSICAL   CHIEF COMPLAINT:  Dyspnea on exertion, exertional chest pain x3 weeks.   HISTORY OF PRESENT ILLNESS:  This is a 70 year old African-American female  who was seen at [facility] today for the above complaint.  She  states that she could not walk to her car from her house without getting  short of breath.  Associated with this she gets a tightness or a burning  sensation in her chest.  Rest makes it better.  She is asymptomatic at rest.  These symptoms have been progressively worsening for the past three weeks.  During her workup at [facility], the patient was found to have a  hemoglobin of only 5.6.  She was therefore transferred to [facility] for admission, transfusion, and further  workup.   REVIEW OF SYMPTOMS:  The patient also complains of constipation as well as  gas.  Her last bowel movement was this morning, but was hard and small.  She  denies any hematochezia.  She does have melena, but she does take iron  supplementation.  She denies any fevers.  She does have a dry cough since  today.  She denies any palpitations.  Denies nausea, vomiting, or diarrhea.  Denies abdominal pain.  Denies dysuria.  Denies hematuria.  Denies joint  pain.   PAST MEDICAL HISTORY:  1. Insulin-dependent diabetes mellitus.  2. Anemia.  3. Hypertension.  4. Hypercholesterolemia.  5. Status post tonsillectomy and adenoidectomy.   MEDICATIONS:  1. Lantus 20 units subcutaneously q.h.s.  2. Unknown blood pressure medication.  3. Unknown diabetes medication.  4.  Ferrous sulfate.  5. Denies chronic non-steroidal anti-inflammatory drugs usage.   ALLERGIES:  ASPIRIN.   SOCIAL HISTORY:  The patient is married and lives with her husband.  She  works as a Programme researcher, broadcasting/film/video.  She denies any tobacco, alcohol, or drug  use.  They have no children.   FAMILY HISTORY:  The patient's mother passed away secondary to being  murdered by her father.  The father passed away secondary to a  cerebrovascular accident.  The patient has many siblings who are apparently  healthy.  The patient denies any family history of colon cancer, leukemia,  lymphoma.   PHYSICAL EXAMINATION:  VITAL SIGNS:  Temperature 100.7, respiratory rate 24,  heart rate 96, blood pressure 136/64, oxygen saturation is 97% on room air.  GENERAL:  This is a pleasant African-American female in  no acute distress.  She is obese.  HEENT:  The patient has mild icterus bilaterally.  There is no  conjunctivitis.  Pupils equal, round, reactive to light.  Extraocular  movements were intact.  Oropharynx is without erythema or exudates.  NECK:  No lymphadenopathy.  LUNGS:  Clear to auscultation bilaterally.  No wheezes or rhonchi.  The  patient has good air movement.  HEART:  Tachycardic, but regular rhythm.  There is a 2/6 systolic ejection  flow murmur heard best at the right sternal border.  ABDOMEN:  Obese, soft, nontender, normoactive bowel sounds.  RECTAL:  The patient had normal tone.  There was no stool in the vault.  Guaiac was therefore negative.  EXTREMITIES:  No edema.  The patient has 2+ peripheral pulses.  The patient  can move all four extremities spontaneously.  SKIN:  The patient has macular circular hyperpigmented lesions covering both  lower extremities as well as her inferior abdomen.  She states that this is  secondary to a previous transfusion.   LABORATORY DATA:  CBC:  White blood cell count 6.3, hemoglobin 5.6,  hematocrit 16.8, platelet count 187, MCV 79.2, RDW 17.2.  EKG  showed normal  sinus rhythm at 91 beats per minute with lateral Q-wave inversion.  There is  no acute ischemic changes.  Chest x-ray shows cardiomegaly, prominent hila,  questionable vascular congestion.  Abdominal x-ray shows colon full of  stool.  Colonoscopy and esophagogastroduodenoscopy performed in 1/03, by Dr.  [name] Plenty showed small hyperplastic colon polyps, antral gastritis, gastric and  duodenal AVM's.  Esophagogastroduodenoscopy and colonoscopy in 2/02,  performed by Dr. Lajoyce Corners showed a hiatal hernia with questionable Barrett's  esophagus.  Small-bowel follow-through in 4/03, was within normal limits,  but showed rapid transient sign.  A barium enema performed in 11/02, showed  no filling defect or constricting lesions.   ASSESSMENT AND PLAN:  This is a 70 year old African-American female with  profound microcytic anemia.  1. Anemia.  We will check old records for workup done previously.  However,     we will go ahead and check her ferratin, iron, total iron binding     capacity, B12, folate, peripheral smear, reticulocyte count, coagulation     profile, renal and liver function.  She will be typed and crossed for 2     units, and transfused.  Her CBC's will have to be followed serially.  The     patient will need a GI re-consult in the morning regarding her findings     of AVM on previous esophagogastroduodenoscopy.  2. Chest pain.  I will go ahead and check one set of enzymes to rule out     cardiac involvement considering her symptoms of the last three weeks.     Her EKG shows no acute ischemic changes.  EKG will be repeated in the     morning.  I will watch her on telemetry overnight.  The patient needs a     formal stress test done once her anemia has resolved.  Her cholesterol     should be checked to further risk stratify her, this can be done as an     outpatient.  3. Diabetes mellitus.  Her Lantus will be continued during her admission.    Her oral hypoglycemics will be  held as she does not know what these are.     She will be covered with sliding scale.  She will be started on an ADA  diet.  A1C will be checked.  CBG's will be checked q.a.c. and q.h.s.  4. Hypertension.  We will hold any medications for now as she does not know     what her medications are and her blood pressure is within normal limits.     She will likely need a ACE inhibitor as an outpatient.  Her blood     pressures will be followed in house.  5. Fever.  Questionable etiology at this point.  The patient might have a     possible early upper respiratory infection or bronchitis given her cough     for one day.  No antibiotics will be given for now.  We will watch her     fever curve.  Consider repeat chest x-ray is fevers persist or symptoms     worsen.  Urinalysis can also be checked.  6.     Fluids, electrolytes, and nutrition.  ADA diet.  We will check a complete     metabolic panel.  She will be continued on ferrous sulfate as well as     vitamin C to increase absorption.  She will transfused with 2 units of     packed red blood cells.  No IV fluids at this time.                                               Vianne Bulls, M.D.    DV/MEDQ  D:  01/01/2002  T:  01/01/2002  Job:  [id number]   cc:   Ophelia Charter, M.D.  [place]  [place] [address]  Fax: [phone]   Ledora Bottcher, M.D.  [address]  [place]  [place] [address]  Fax: [phone]

## 2010-06-27 NOTE — H&P (Signed)
[name], CHASON NO.:  [id number]   MEDICAL RECORD NO.:  [id number]          PATIENT TYPE:  INP   LOCATION:  1856                         FACILITY:  [facility]   PHYSICIAN:  [name], M.D.  DATE OF BIRTH:  Sep 10, 1940   DATE OF ADMISSION:  02/05/2005  DATE OF DISCHARGE:                                HISTORY & PHYSICAL   CHIEF COMPLAINT:  Sleepy and fatigued x1 week.   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  The patient also has hyperlipidemia and severe  lichen planus.  Her hemoglobin in the emergency department was 6.1, and she  does note some dark stools over the last week.  She is, however, on p.o.  iron.  She reports no abdominal pain, no bloody stools, no constipation,  diarrhea, nausea or vomiting.  Her last endoscopy was 3 months ago, and she  had a capsule endoscopy 3 years ago.  She does have a history of CHF with  transfusions.   REVIEW OF SYSTEMS:  CONSTITUTIONAL:  She is negative for dizziness.  CARDIOVASCULAR:  She has somewhat worse palpitations this week.  PULMONARY:  No dyspnea.  GI:  She has noted some dark stool.  No nausea or vomiting,  diarrhea or constipation.  GU:  No complaints.  SKIN:  She does have, as  mentioned lichen planus and telangiectasias on the left foot.  She also  notes some pain in her right shoulder.  All other systems are unremarkable  except where noted, irrelevant.   PAST MEDICAL HISTORY:  1.  Diabetes mellitus type 2.  2.  Anemia with a history of blood loss from telangiectasias.  3.  Hyperlipidemia.  4.  Tinea corporis.   CURRENT MEDICATIONS:  1.  Enalapril 5 mg p.o. daily.  2.  Ferrous sulfate 325 mg twice a day.  3.  Lantus 46 units nightly.  4.  Metformin 500 mg twice a day.  5.  Omeprazole 20 mg  once a day.  6.  Simvastatin 20 mg once a day.  7.  Triamcinolone cream 0.1% applied three times a day.   ALLERGIES:  ASPIRIN.   FAMILY HISTORY:  Notable for a brother with an MI and 3 sisters with  Alzheimer's.   PROCEDURES OF NOTE:  1.  The last colonoscopy in January 2004 revealed multiple polyps and      duodenal AVMs noted.  AVMs have been noted in the stomach on the January      2004 scope.  2.  EGD in December of 2006 noted vascular ectasia.  3.  Small bowel follow through was within normal limits in April of 2003.   SOCIAL HISTORY:  She has a remote history of smoking 3 cigarettes a day.  No  alcohol.  No drugs.  Lives with husband.  Has no children.  Is a TEFL teacher.   PHYSICAL EXAMINATION:  VITAL SIGNS:  Temperature 97.8 degrees, blood  pressure 139/63, pulse 92, respirations 20, satting 99% on room air.  GENERAL APPEARANCE:  The patient is well-appearing, in no acute distress.  Mental status is normal.  HEENT:  Head, eyes, ears, mouth and throat exams are normal.  CHEST/BREASTS:  Normal.  LUNGS:  Normal.  Her lungs are clear to auscultation bilaterally.  HEART:  Notable for a systolic ejection murmur.  Otherwise normal.  ABDOMEN:  Normal.  EXTREMITIES:  There is severe lichen planus on both legs bilaterally.  There  is no edema.  Pulses are full and equal bilaterally.  GENITAL/RECTAL:  Rectal exam is normal.  There is no stool in the vault, and  consequently Hemoccult is negative.  NEUROLOGIC:  Cranial nerves are normal.  Strength/DTRs are normal.  Balance/gait are normal.  SKIN:  Severe lichen planus.   LABORATORY TESTS:  INR 1.1, PTT 31, hemoglobin 6.1, hematocrit 19.3, white  blood cell count 5.1, platelets 231.  She has had a normal urinalysis.  Sodium 134, potassium 3.7, chloride 107, CO2 of 23, BUN 10, creatinine 0.8,  glucose 181, calcium 9.3, albumin 32.  AST 24, ALT 20. AP 85, TP 5.7, TB 0.6  .   ASSESSMENT AND PLAN:  This is a 70 year old  African-American female with  diabetes mellitus type 2 and GI telangiectasias who presents with anemia.  1.  Anemia, most likely related to bleeding from her internal      telangiectasias.  We will transfuse 2 units of packed red blood cells      very slowly because of his history of seizure after transfusion.  We      will call GI, Dr. [name] Plenty, this evening to see if they would like to do      another EGD for cauterization of her AVMs.  We will start b.i.d.      Protonix and hold her p.o. iron.  2.  Diabetes mellitus.  Continue home Lantus.  Hold metformin.  Start      sliding scale.  3.  Hypertension.  Will hold enalapril for 24 hours, given current bleeding      risk, and plan to restart after that.  4.  Lichen planus.  Continue triamcinolone cream.  5.  Hyperlipidemia.  Continue Zocor daily.     ______________________________  [name]    ______________________________  [name], M.D.    FT/MEDQ  D:  02/05/2005  T:  02/06/2005  Job:  [id number]

## 2010-06-27 NOTE — Consult Note (Signed)
[name], [name] NO.:  [id number]   MEDICAL RECORD NO.:  [id number]          PATIENT TYPE:  INP   LOCATION:  [id number]                         FACILITY:  [facility]   PHYSICIAN:  Ronald Lobo, M.D.   DATE OF BIRTH:  01/09/1941   DATE OF CONSULTATION:  04/25/2004  DATE OF DISCHARGE:                                   CONSULTATION   REASON FOR CONSULTATION:  The family practice teaching service asked [place] to  see this 70 year old, African-American female because of progressive anemia  and apparent GI bleeding.   [name] was admitted to the hospital yesterday with progressive weakness  and noted to have a hemoglobin which had dropped from approximately 11 to  8.5 over the preceding month, and in fact after overnight hydration, it fell  to 7.0 despite the fact that her BUN was essentially normal at 15 on  admission.   The patient has a prior history of gastroduodenal vascular ectasia noted by  my partner, Dr. Teena Irani, on endoscopy in January 2003.  The patient is  maintained on iron which might account to some degree for the dark stools  which were noted on examination in the family practice clinic yesterday.  The patient does not have any history of exposure to aspirin or NSAIDS and  it does not appear that she has been on any anti-peptic therapy.  She does  not have dyspeptic symptomatology.   ALLERGIES:  ASPIRIN (nausea and vomiting).   OUTPATIENT MEDICATIONS:  Metformin, Enalapril, Lantus insulin, Zocor and  iron.   PAST SURGICAL HISTORY:  T&A.   PAST MEDICAL HISTORY:  1.  Diabetes of about 12 years duration.  2.  Hypertension.  3.  No known cardiopulmonary disease.   HABITS:  Nonsmoker, nondrinker.   FAMILY HISTORY:  Not obtained.   SOCIAL HISTORY:  Not obtained.   REVIEW OF SYSTEMS:  Per above, not otherwise.   PHYSICAL EXAMINATION:  GENERAL:  This is a pleasant, somewhat overweight,  African-American female in no evident distress  whatsoever.  HEENT:  She has received 2 units of packed cells since admission and does  not have frank pallor nor any icterus. The oropharynx is benign.  CHEST:  The chest is clear.  HEART:  The heart is normal.  ABDOMEN:  The abdomen is without overt mass or tenderness.   LABORATORY DATA AND X-RAY FINDINGS:  Admission hemoglobin was 7.2 with an  MCV of 83 and an elevated RDW of 15.6, platelets 224,000, white count 5400.  Chemistry panel pertinent for normal liver chemistries, slightly low albumin  of 3.1 and iron deficiency results including 7% iron saturation and a  ferritin level of 7.   Note that the patient was found to be Hemoccult positive on digital exam in  the clinic yesterday, which showed apparently quite dark stool.   IMPRESSION:  1.  Recurrent iron deficiency anemia.  2.  Probable subacute gastrointestinal blood loss over the past month based      on heme positive stool and fairly rapid drop in hemoglobin (roughly 4      gram  drop in 4 weeks).  3.  Prior history of gastroduodenal arteriovenous malformations in 2003.  4.  Previous colonoscopies showing some hyperplastic polyps, but no      significant abnormalities.   RECOMMENDATIONS:  Endoscopic evaluation to check the current status of the  gastroduodenal AVMs.      RB/MEDQ  D:  04/25/2004  T:  04/26/2004  Job:  [id number]   cc:   Billey Chang, M.D.  Fax: [phone]

## 2010-06-27 NOTE — H&P (Signed)
[name], [name]                ACCOUNT NO.:  [id number]   MEDICAL RECORD NO.:  [id number]          PATIENT TYPE:  OBV   LOCATION:  5153                         FACILITY:  [facility]   PHYSICIAN:  Servando Snare, M.D.  DATE OF BIRTH:  03-17-1940   DATE OF ADMISSION:  03/19/2006  DATE OF DISCHARGE:                              HISTORY & PHYSICAL   ADMISSION DIAGNOSIS:  Anemia with gastrointestinal bleed, chronic.   HISTORY OF PRESENT ILLNESS:  The patient is a 70 year old African  American female who presented to [facility] today for a  hemoglobin check, with know history of chronic GI bleeding with  telangiectasia and her hemoglobin was noted to be 7.1.  Previously, she  was seen last week and had it at 7.9.  Two weeks ago she was at 9.1.  She most recently had 2 transfusions, recently within the last 2 weeks,  and subsequently has a followup appointment with GI outpatient set up  for March 23, 2006.  With note of her hemoglobin being so low, we  decided to go ahead and bring her on in to have her transfused.  Since  the Short Stay did not have bed availability and is closed on Saturday,  we had to bring her in for a 23-hour observation status to receive 2  units of packed red blood cells.  Otherwise, she has been nondizzy.   REVIEW OF SYSTEMS:  Review of systems is negative for light-headedness  or dizziness.  No chest pain, no shortness of breath.  No GU complaints.  She has chronic lichen planus on her legs and skin.  She has a history  of recent nosebleeds, a history of GI dark, tarry stools, but otherwise,  she has a known history of being fecal occult blood positive.   PAST MEDICAL HISTORY:  Significant for  1. Acute GI bleed, chronic, currently being followed by Dr. [name] Plenty with      GI.  2. Diabetes mellitus, type 2, on insulin therapy.  3. Hyperlipidemia.  4. Tinea corporis.  5. Lichen planus.  6. Hypertension.   MEDICATIONS:  Significant for:  1.  Enalapril 5 mg daily.  2. Lantus 46 units q.h.s.  3. Metformin 500 mg b.i.d.  4. Prilosec 20 mg p.o. b.i.d.  5. Simvastatin 40 mg p.o. every day.  6. Triamcinolone 0.1% as needed  7. Iron sulfate 325 t.i.d.   ALLERGIES:  ASPIRIN.   FAMILY HISTORY:  She has a brother with a history of a heart attack.  She has 3 sisters with Alzheimer's.  She has a sister with breast  cancer.  No strokes.   Procedure-wise, she recently has been seen by GI outpatient.  Those  studies are currently pending.   PHYSICAL EXAMINATION:  VITAL SIGNS:  Her vital signs currently are  stable.  GENERAL:  She is in no acute distress.  A pleasant, ill-appearing  African American female.  Mental status is alert and oriented x5.  HEENT:  Head is normocephalic, atraumatic.  Extraocular muscles are  intact.  Moist mucous membranes.  CARDIOVASCULAR:  Regular rate and rhythm.  No murmurs, rubs, or gallops.  LUNGS:  Clear to auscultation bilaterally.  ABDOMEN:  Soft, nontender, nondistended.  EXTREMITIES:  Notable changes for chronic severe lichen planus, with  some blistering, but no noted sites of infection.   LABORATORY DATA:  Labs are significant for a hemoglobin previously of  9.1, today at 7.1.   ASSESSMENT AND PLAN:  This is a 70 year old African American female,  here with a chronic history of a gastrointestinal bleed, now with acute  blood loss, but stable.  1. We are going to admit her for a 23-hour observation for transfusion      of 2 units packed red blood cells to give these over 3 hours each.      We will check and hemoglobin and hematocrit 2 hours post-      transfusion.  Since there were no beds at Short Stay, we will admit      her for 23-hours obs.  2. Diabetes.  We are going to hold her metformin.  We are going to      decrease her Lantus down to 40 units since she is in the hospital,      and no sliding scale.  3. Hyperlipidemia.  We will continue her simvastatin 40 mg at bedtime.  4. For  anemia, she is scheduled to see the outpatient GI next week      with Dr. [name] Plenty on February 12.  We will follow her up there.  We      will just recheck her hemoglobin and hematocrit on Tuesday,      March 23, 2006, just to follow up.   DISPOSITION:  We will discharge her home in the morning after her  transfusion.      Servando Snare, M.D.     MB/MEDQ  D:  03/19/2006  T:  03/21/2006  Job:  [id number]

## 2010-06-27 NOTE — Discharge Summary (Signed)
[name], [name]                ACCOUNT NO.:  [id number]   MEDICAL RECORD NO.:  [id number]          PATIENT TYPE:  INP   LOCATION:  4706                         FACILITY:  [facility]   PHYSICIAN:  Marzetta Merino, M.D.     DATE OF BIRTH:  1940-04-27   DATE OF ADMISSION:  05/28/2006  DATE OF DISCHARGE:  05/29/2006                               DISCHARGE SUMMARY   DISCHARGE DIAGNOSES:  1. Gastrointestinal bleed, status post transfusion.  2. History of multiple duodenal arteriovenous malformation.  3. Melena.  4. Type 2 diabetes.  5. Hyperlipidemia.  6. Hypertension.  7. Chronic lichen sclerosus.   DISCHARGE MEDICATIONS:  1. Lantus 46 units at bedtime.  2. Enalapril 5 mg daily.  3. Metformin 500 mg p.o. b.i.d.  4. Simvastatin 40 mg daily.  5. Triamcinolone cream to use as needed.   BRIEF HOSPITAL COURSE:  The patient is a 70 year old African-American  female whom I know very well.  I see her as her PCP at the [facility].  She has been treated by Dr. [name] Plenty for GI and AVMs that  have been treated with laser treatments.  The patient was seen in the  clinic on a week prior and noted to have a hemoglobin of 10.3 and then  noted to this week have her hemoglobin dropping at it lowest at 7.2 as  of Friday.  There were no beds available in the short stay so the  patient was admitted for 23 hours for a blood transfusion of 2 units.  The patient did well with the procedure.  She tolerated the transfusion  well and her hemoglobin was 10.9 at the time of discharge.   The patient has a follow up appointment scheduled with Dr. [name] Plenty at  [facility] as of Monday to perform a capsule endoscopy to evaluate for her  further bleeding since he thinks this type of brisk bleeding with  melanotic stools is probably not likely due to her duodenal AVM.  The  patient will follow up with Dr. [name] Plenty and follow up with Dr. Erling Cruz as  previously scheduled.  The patient is to resume all home medications  as  planned.  The patient understands and agrees with this plan.      Marzetta Merino, M.D.     ML/MEDQ  D:  05/29/2006  T:  05/30/2006  Job:  [id number]

## 2010-06-27 NOTE — Discharge Summary (Signed)
[name], [name]                ACCOUNT NO.:  [id number]   MEDICAL RECORD NO.:  [id number]          PATIENT TYPE:  INP   LOCATION:  [id number]                         FACILITY:  [facility]   PHYSICIAN:  Billey Chang, M.D.     DATE OF BIRTH:  March 21, 1940   DATE OF ADMISSION:  04/24/2004  DATE OF DISCHARGE:  04/27/2004                                 DISCHARGE SUMMARY   DISCHARGE DIAGNOSES:  1.  Upper gastrointestinal bleed secondary to telangiectasia.  2.  Iron deficiency anemia.  3.  Diabetes.  4.  Mildly elevated liver function tests.   DISCHARGE MEDICATIONS:  1.  Enalapril 5 mg p.o. daily.  2.  Ferrous sulfate 325 mg p.o. t.i.d.  3.  Lantus 44 units subcu q.h.s.  4.  Metformin 500 mg p.o. b.i.d.  5.  Zocor 20 mg p.o. daily.  6.  Prilosec OTC 20 mg p.o. daily.  7.  Flonase one spray in each nostril p.o. daily.  8.  Colace p.r.n.   DISPOSITION:  Patient discharged to home.   FOLLOWUP:  Patient told to call Dr. Osborn Coho office tomorrow, today is  Sunday, and schedule hospital follow up appointment and let the receptionist  know that the patient will need to be scheduled for a capsule EGD.  The  patient was given the phone number prior to discharge.   PROCEDURES:  1.  EGD which showed multiple small vascular ectasias with minimal oozing      from a couple of them.  2.  Red blood cell transfusion times two units.   CONSULTATIONS:  Gastroenterology, Dr. Cristina Gong.   BRIEF ADMISSION HISTORY:  This is a 70 year old African American female who  presented to the outpatient clinic for having black stools x2 and having  fatigue.  CBC in the office was 8.7, and it had been 11 on February 27.  Therefore, the patient was admitted for GI bleed, as she was also heme  positive.  Vitals were stable at the time of admission.   HOSPITAL COURSE:  #1 - GASTROINTESTINAL BLEED.  The patient was admitted to  telemetry, was kept n.p.o. on the night of admission and GI consult was  called.  I decided  for the patient to have an upper endoscopy, as the  patient had had a recent colonoscopy.  As above, EGD found multiple  telangiectasias with minimal oozing.  The thought was that there is a  possibility of a large number of them throughout the duodenum and small  bowel.  Therefore, treatment of the one seen in the stomach would not help  the overall situation.  It was felt best that the patient would need a  capsule EGD to evaluate the situation.  It was decided that EGD could be  done as an outpatient if hemoglobin was stable and inpatient if hemoglobin  was unstable.  The patient's hemoglobin was stable after transfusion, but  greater than 24 hours.  Therefore, it was decided that the patient could be  discharged and set up for capsule EGD as outpatient.   #2 - ANEMIA.  The patient was admitted.  Hemoglobin was 7.2.  Repeat the  next morning was 7.0.  Therefore, it was decided that the patient should  transfuse.  She was transfused two units.  Posttransfusion CBC was 9.8, and  the patient had 9.6 the day of discharge.  She was given NSAID beginning on  March 17.  She received a dose on March 17, 18 and 19 of 100 mg and a test  dose of 25 mg.  It was felt that the patient will continue on ferrous  sulfate p.o. t.i.d. at discharge.  The patient had a ferritin level on March  16 of 7, iron of 25, total iron binding capacity of 369 and percent  saturation 7.  Again, discharge hemoglobin was 9.6.   #3 - ELEVATED LIVER FUNCTION TESTS.  On admission, the patient's AFT was 32,  ALT 31, alkaline phosphatase of 77 with total bilirubin of 0.6.  The day of  discharge, the patient's alkaline phosphatase was 76, AST 61, ALT 47, total  bilirubin 0.8.  Unsure of the etiology of this, but the patient has been  asymptomatic as far as abdominal pain.  Therefore, the subject can be  followed up as an outpatient.  The patient's other medical problems were  stable during hospitalization.  Initially,  Enalapril was held for decreased  blood pressures.  Blood pressures stabilized prior to discharge, and the  patient was discharged to restart Enalapril at home.  As far as her  diabetes, initially, the patient's Lantus dose was given 50% if Metformin  was held for hospitalization.  The patient was given a regular dose of  Lantus the day prior to discharge, and patient will restart her Metformin as  an outpatient.      AD/MEDQ  D:  04/27/2004  T:  04/28/2004  Job:  [id number]   cc:   Manus Rudd, MD  Fax: [phone]   Ronald Lobo, M.D.  [address]  [place], [place] [address]  Fax: [phone]

## 2010-06-27 NOTE — H&P (Signed)
[name], [name]                ACCOUNT NO.:  [id number]   MEDICAL RECORD NO.:  [id number]          PATIENT TYPE:  INP   LOCATION:  5705                         FACILITY:  [facility]   PHYSICIAN:  Jamal Collin. Hensel, M.D.DATE OF BIRTH:  30-Jun-1940   DATE OF ADMISSION:  05/12/2006  DATE OF DISCHARGE:                              HISTORY & PHYSICAL   PRIMARY CARE PHYSICIAN:  Marzetta Merino, MD, at [facility].   CHIEF COMPLAINT:  Abdominal pain and elevated blood sugars.   HPI:  Mrs. 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  She is followed  by Dr. [name] Plenty as her primary gastroenterologist.  She has received  multiple blood transfusions in the past, most recently on March 31, 2006.  Her hemoglobin in clinic on March 26 was 11.5.  She presents with  a 1-day history of her stomach feeling funny like it usually does when  her blood count is low.  She also endorses dark tarry stools that  started last night.  She has had 2 bowel movements per day over the past  day.  She does not have any dizziness or confusion or emesis and no  other spontaneous bleeding such as nosebleeds.   Blood sugars have been high at home for the past 2 days.  It was over  400 this morning at 4 o'clock in the morning.  She says that she has  been taking her metformin and Lantus as regularly prescribed.  She  states her blood sugar is always elevated when her blood count is low.  She denies polyuria and polydipsia.   REVIEW OF SYSTEMS:  See above, as per HPI.   PAST MEDICAL HISTORY:  1. Hypertension.  2. Hyperlipidemia.  3. Type 2 diabetes.  4. 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  She underwent endoscopy with      laser therapy in December 2007 and had a colonoscopy that was  normal in December 2007.  5. A history of nosebleeds.  6. Lichen planus.   HOME MEDICATIONS:  1. Enalapril 5 mg p.o. daily.  2. Iron sulfate 325 mg by mouth 3 times daily.  3. Lantus 46 units subcutaneously each night.  4. Metformin 500 mg p.o. b.i.d.  5. Prilosec OTC 20 mg by mouth b.i.d.  6. Zocor 40 mg p.o. daily.   ALLERGIES:  ASPIRIN, the reaction is unknown.   FAMILY HISTORY:  Patient has 3 sisters with Alzheimer's, a brother has a  history of an MI, there is no history of stroke, and another sister has  a history of breast cancer.   SOCIAL HISTORY:  The patient smoked 3 cigarettes a day for 2 years many  years ago.  She denies any history of alcohol or illicit drugs.  She  lives in [place] with her husband and has no children.  [place] worked  her whole life on a farm.  She is currently  a Programme researcher, broadcasting/film/video.   VITAL SIGNS:  Pulse 106, heart rate 102, blood pressure 140/62.  Of  note, orthostatic vital signs taken in clinic were negative for  orthostasis.   PHYSICAL EXAM:  GENERAL:  The patient is in no acute distress.  She is  pleasant, alert, appropriate, and cooperative throughout examination.  HEAD:  Normocephalic, atraumatic.  MOUTH:  Mucous membranes are moist with no lesions.  ABDOMEN:  Bowel sounds are positive.  Abdomen is soft and nontender  without masses.   LABS:  The patient's hemoglobin was found to be 7.8 in clinic, and her  random glucose was 317.   ASSESSMENT AND PLAN:  1. This is a 70 year old African-American female with a known history      of gastrointestinal blood loss secondary to arteriovenous      malformations.  Her hemoglobin today is 7.8, down from 11.5 seven      days ago.  She does not have any dizziness or confusion and is not      orthostatic.  As there are no available spots for outpatient      transfusions, available until 48 hours from this point, we will      have to admit for blood transfusion.  She  does not have any known      history of coronary artery disease; however, we do not want her      hemoglobin to drop below 6, and I fear that it could potentially do      this if we were to wait for her to be transfused as an outpatient.      We will type and cross as well as transfuse 2 units of packed red      blood cells.  We also plan to call Dr. [name] Plenty in the morning to      inform of this patient's hospitalization.  We will also check a CBC      in the morning, and we anticipate discharge after 23 hours if there      is an appropriate rise in hemoglobin and the patient is otherwise      stable.  2. Diabetes.  Recently uncontrolled over the past 2 days, likely      secondary to a stress response from acute anemia.  We will start      her home Lantus regimen and start moderate sliding scale.  We will      hold her metformin while in the hospital in case of any potential      bi-load that may become an insult to her kidneys.  We will also      start fluids of normal saline at 75 mL an hour until her sugars      come down.  3. Hypertension.  We will hold the patient's home lisinopril for now      due to possible risk of hypotension.  4. Hyperlipidemia.  We will continue Zocor while in the hospital.     ______________________________  Dixon Boos, M.D.    ______________________________  Jamal Collin. Andria Frames, M.D.    MJ/MEDQ  D:  05/12/2006  T:  05/13/2006  Job:  [id number]

## 2010-06-27 NOTE — Op Note (Signed)
NAME:  [name], [name]                ACCOUNT NO.:  [id number]   MEDICAL RECORD NO.:  [id number]          PATIENT TYPE:  AMB   LOCATION:  ENDO                         FACILITY:  [facility]   PHYSICIAN:  [name] C. [name] Plenty, M.D.    DATE OF BIRTH:  Apr 27, 1940   DATE OF PROCEDURE:  06/02/2006  DATE OF DISCHARGE:  06/02/2006                               OPERATIVE REPORT   GASTROENTEROLOGIST:  Elyse Jarvis. [name] Plenty, M.D.   PROCEDURE:  Capsule endoscopy.   INDICATIONS FOR PROCEDURE:  Recurrent GI bleeding of obscure origin.  The patient has known gastric and duodenal, as well as possible jejunal  AVMs.  Procedure is for better localization of any recent or current  active bleeding.   FINDINGS:  Gastric transit time was 1 hour 20 minutes.  Small bowel  transit time was 1 hour 18 minutes.  There were a few gastric AVMs in  the stomach, with 1 area of slight active bleeding.  I could no actually  see the exact bleeding source, but there was an AVM in the vicinity of  the active bleeding.  There were several AVMs in the duodenum.  This  appeared to be the distal duodenum by capsule program localization.  There were 2 or 3 more more distally, and it was unclear whether these  were proximal or distal to the ligament of Treitz, but probably located  in the distal duodenum or proximal jejunum.  The remainder of the study  was negative.   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   PLAN:  Will need to repeat EGD with further cautery of gastric AVMs and  attempt at reaching the distal duodenal AVMs with a pediatric  colonoscope.           ______________________________  Elyse Jarvis. [name] Plenty, M.D.     JCH/MEDQ  D:  06/10/2006  T:  06/11/2006  Job:  [id number]

## 2010-06-27 NOTE — Discharge Summary (Signed)
[name], [name]                ACCOUNT NO.:  [id number]   MEDICAL RECORD NO.:  [id number]          PATIENT TYPE:  INP   LOCATION:  5703                         FACILITY:  [facility]   PHYSICIAN:  Carolyn Stare, MDDATE OF BIRTH:  06/17/1940   DATE OF ADMISSION:  12/26/2005  DATE OF DISCHARGE:  12/27/2005                                 DISCHARGE SUMMARY   RESIDENT PHYSICIAN:  Dr. Carolyn Stare.   ADMISSION DATE:  1. Anemia secondary to chronic blood loss.  2. Duodenal arteriovenous malformation.  3. Diabetes mellitus, type 2.  4. Hypertension.  5. Hyperlipidemia.   MEDICATIONS AT DISCHARGE:  1. Enalapril 5 mg p.o. daily.  2. Lantus 46 units subcu q.h.s.  3. Metformin 500 mg p.o. b.i.d.  4. Prilosec OTC 20 mg p.o. b.i.d.  5. Simvastatin 80 mg to take 1/2 tablet p.o. daily.  6. Ferrous sulfate 325 mg p.o. b.i.d.   ALLERGIES:  ASPIRIN.   BRIEF HISTORY:  [name] is a 70 year old black female patient who  was admitted yesterday with some decreased hemoglobin, symptomatic,  secondary to duodenal telangiectasias, chronic bleeding.  Hemoglobin at  admission was 7.5.  The patient received 2 units of red blood cells  transfusion, improving symptomatically in the morning, feeling stronger, and  without shortness of breath.  Hemoglobin at discharge was 9.3 with an  hematocrit 27.8.   1. Anemia:  Improvement, still present.  Ferrous sulfate 325 mg p.o.      b.i.d. recommended.  Anemia secondary to gastrointestinal      telangiectasias.  Will recommend as outpatient to evaluate the      possibility to perform colonoscopy earlier.  Patient has already      scheduled it for January 2008.  Also, patient to return to clinic      Wednesday for CBC with differential and also schedule followup      appointment with primary physician in 1 week.  2. Diabetes mellitus, type 2, uncomplicated, stable.  We discharged      patient on the same home regimen.  3. Hyperlipidemia.   Unchanged during this admission.  4. Hypertension, well controlled.     Carolyn Stare, MD    IM/MEDQ  D:  12/27/2005  T:  12/27/2005  Job:  [id number]

## 2010-06-27 NOTE — Discharge Summary (Signed)
[name], [name]                ACCOUNT NO.:  [id number]   MEDICAL RECORD NO.:  [id number]          PATIENT TYPE:  OBV   LOCATION:  [id number]                         FACILITY:  [facility]   PHYSICIAN:  Mayra Neer, M.D.   DATE OF BIRTH:  01-22-1941   DATE OF ADMISSION:  03/19/2006  DATE OF DISCHARGE:  03/20/2006                               DISCHARGE SUMMARY   PRIMARY CARE PHYSICIAN:  Dr. Marzetta Merino at [facility].   Patient was admitted for 23 hour observation.   HOSPITAL COURSE:  Please see dictated H&P for full details.  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  She was found to have a hemoglobin of 7.1  in clinic today at a lab visit.  Unfortunately, the short stay center  did not have any available beds and no other options were opened, so the  patient was admitted was admitted for 23 hour observation for 2 units of  packed red blood cell transfusion, for which she tolerated without  incident.  She has a lab appointment on Monday, February 11, at the  [facility] to check a CBC and coagulopathy.  She  also has a scheduled GI appointment on February 12 for further  management of her GI bleed.           ______________________________  Mayra Neer, M.D.     KS/MEDQ  D:  03/20/2006  T:  03/21/2006  Job:  [id number]

## 2010-06-27 NOTE — H&P (Signed)
NAMEPROVIDENCE, HAGGERTY                ACCOUNT NO.:  [id number]   MEDICAL RECORD NO.:  [id number]          PATIENT TYPE:  INP   LOCATION:  4706                         FACILITY:  [facility]   PHYSICIAN:  Cletus Gash T. Pickard II, MDDATE OF BIRTH:  1940-07-09   DATE OF ADMISSION:  05/28/2006  DATE OF DISCHARGE:                              HISTORY & PHYSICAL   CHIEF COMPLAINT:  Melena.   HISTORY OF PRESENT ILLNESS:  Patient had a EGD approximately 1 week ago  which showed 3 duodenal AVMs, which were small.  Dr. [name] Plenty was able to  reach 2 of these and irrigate it.  However, he cannot reach the third.  His plan per his report was to repeat an EGD with laser if the patient  was to be bleeding from these in the future.  The hemoglobin on April  2nd was 10.1.  Her hemoglobin on April 16th was 8.3, and her hemoglobin  today on April 18th is 7.2.  She is reporting melena.   She denies any chest pain, shortness of breath, dyspnea on exertion or  syncope.  However, she is losing approximately a unit every 2 days based  on her last lab count.   PAST MEDICAL HISTORY:  1. Diabetes mellitus.  2. Anemia.  3. Hyperlipidemia.  4. Hypertension.  5. 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   MEDICATIONS:  1. Enalapril 5 mg p.o. every day.  2. Lantus 46 units nightly.  3. Metformin 500 mg p.o. b.i.d.  4. Prilosec 20 mg p.o. b.i.d.  5. Simvastatin 80 mg 1/2 tablet p.o. every day.  6. Triamcinolone t.i.d.   ALLERGIES:  ASPIRIN.   FAMILY HISTORY:  Brother with an MI.  She has 3 sisters with  Alzheimer's.  She has a sister with breast cancer.  Significantly  history of strokes.   SOCIAL HISTORY:  Patient has smoked 3 cigarettes a day for 2 years a  long time ago.  No alcohol.  No drugs.  She lives with her husband.  No  children.   PHYSICAL EXAMINATION:  VITAL SIGNS:  Noted and stable.  GENERAL APPEARANCE:  No apparent distress.  Alert and oriented x3.  HEENT:  Atraumatic, normocephalic.   Pupils equal round and reactive to  light.  Extraocular movements are intact.  TMs good canals.  CRANIAL EXAMINATION:  No erythema or exudate in the posterior  oropharynx.  Mucous membranes are moist.  NECK:  Supple with no lymphadenopathy.  No JVD.  No carotid bruits.  LUNGS:  Clear to auscultation bilaterally.  No wheezes, crackles or  rales.  CARDIOVASCULAR:  Regular rate and rhythm with a 99991111 systolic ejection  murmur that radiates into both carotids.  ABDOMEN:  Soft, nontender, nondistended.  Positive bowel sounds.  EXTREMITIES:  Have no cyanosis, clubbing or edema.  RECTAL EXAM:  Patient is guaiac positive with melenic stool.  NEUROLOGIC EXAM:  Cranial nerves II-XII grossly intact.  Muscle strength  5/5 equal and symmetric in the upper and lower extremities.   LAB TESTS:  Hemoglobin 7.2.   ASSESSMENT/PLAN:  1. Anemia.  Likely from  GI bleed.  The patient is down 1 unit over the      last 2 days given her age, hypertension, diabetes we would want to      transfuse the patient today rather than waiting through the      weekend.  We called short stay but the patient is unable to be      transfused today.  Therefore we will admit for 23 hour observation      and 2 units packed red blood cells.  We will continue b.i.d. proton      pump inhibitors and repeat a CBC in the a.m.  I spoke with Dr.      [name] Plenty of gastroenterology.  He thinks it is likely a small bowel      source of her bleeding.  However, he did an EGD 1 week ago.  He      thinks that the AVMs that he saw are not probably the source of      this type of blood loss therefore he thinks she needs an capsule      endoscopy.  However, these cannot be done through the weekend.      Therefore the plan is to transfuse the patient 2 units and if her      hemoglobin is stable and bumps accordingly to discharge home in the      a.m.  Dr. [name] Plenty plans to see her early next week for a capsule      endoscopy and he will call her with  an appointment Monday morning.  2. Hypertension.  Hold the enalapril.  Give her p.r.n. medicines as      necessary.  3. Diabetes mellitus.  Continue the Lantus.  Hold the metformin.      Cover with sliding-scale insulin.  4. Hyperlipidemia.  Zocor 40 mg p.o. nightly.      Cletus Gash T. Pedro Earls, MD     WTP/MEDQ  D:  05/28/2006  T:  05/28/2006  Job:  [id number]   cc:   Elyse Jarvis. [name] Plenty, M.D.

## 2010-06-27 NOTE — Procedures (Signed)
[facility]  Patient:    [name], [name]                       MRN: [id number] Proc. Date: 04/01/00 Adm. Date:  [id number] Attending:  Jim Desanctis                           Procedure Report  PROCEDURE:  Endoscopy.  SURGEON:  Jim Desanctis, M.D.  INDICATIONS:  Heme positive.  ANESTHESIA:  Demerol 60 and Versed 6.  DESCRIPTION OF PROCEDURE:  With the patient mildly sedated in the left lateral decubitus position, the Olympus videoscopic endoscope was inserted in the mouth and passed under direct vision through the esophagus until the distal esophagus was approached, and showed Barretts esophagus, photographed and biopsied.  We entered into the stomach.  The fundus, body, antrum, duodenal bulb, and second portion of the duodenum were visualized and photographed.  From this point, the endoscope was slowly withdrawn taking circumferential views of the entire duodenal mucosa until the endoscope had been pulled back and the stomach placed on retroflexion to view the stomach from below.  A hiatal hernia was visualized.  The endoscope was straightened and withdrawn, taking circumferential views of the remaining gastric and esophageal mucosa.  The patients vital signs and pulse oximeter remained stable.  The patient tolerated the procedure well and without apparent complications.  FINDINGS:  Hiatal hernia with question of Barretts esophagus above it, await biopsy report.  The patient will call me for results and follow up with me as an outpatient as needed. DD:  04/01/00 TD:  04/02/00 Job: [id number]

## 2010-08-04 ENCOUNTER — Other Ambulatory Visit: Payer: Self-pay | Admitting: Hematology and Oncology

## 2010-08-04 ENCOUNTER — Encounter (HOSPITAL_BASED_OUTPATIENT_CLINIC_OR_DEPARTMENT_OTHER): Payer: Medicare Other | Admitting: Hematology and Oncology

## 2010-08-04 ENCOUNTER — Encounter (HOSPITAL_COMMUNITY)
Admission: RE | Admit: 2010-08-04 | Discharge: 2010-08-04 | Disposition: A | Discharge status: Home / Self Care | Payer: Medicare Other | Source: Ambulatory Visit | Attending: Hematology and Oncology | Admitting: Hematology and Oncology

## 2010-08-04 DIAGNOSIS — D509 Iron deficiency anemia, unspecified: Secondary | ICD-10-CM

## 2010-08-04 DIAGNOSIS — D649 Anemia, unspecified: Secondary | ICD-10-CM | POA: Insufficient documentation

## 2010-08-04 DIAGNOSIS — I780 Hereditary hemorrhagic telangiectasia: Secondary | ICD-10-CM

## 2010-08-04 LAB — CBC WITH DIFFERENTIAL/PLATELET
Basophils Absolute: 0 10*3/uL (ref 0.0–0.1)
HCT: 24 % — ABNORMAL LOW (ref 34.8–46.6)
HGB: 7.2 g/dL — ABNORMAL LOW (ref 11.6–15.9)
MONO#: 0.5 10*3/uL (ref 0.1–0.9)
NEUT%: 64.7 % (ref 38.4–76.8)
Platelets: 197 10*3/uL (ref 145–400)
WBC: 4.2 10*3/uL (ref 3.9–10.3)
lymph#: 0.7 10*3/uL — ABNORMAL LOW (ref 0.9–3.3)

## 2010-08-04 LAB — BASIC METABOLIC PANEL
CO2: 24 mEq/L (ref 19–32)
Calcium: 8.9 mg/dL (ref 8.4–10.5)
Creatinine, Ser: 0.72 mg/dL (ref 0.50–1.10)
Glucose, Bld: 140 mg/dL — ABNORMAL HIGH (ref 70–99)

## 2010-08-06 ENCOUNTER — Encounter (HOSPITAL_BASED_OUTPATIENT_CLINIC_OR_DEPARTMENT_OTHER): Payer: Medicare Other | Admitting: Hematology and Oncology

## 2010-08-06 DIAGNOSIS — D509 Iron deficiency anemia, unspecified: Secondary | ICD-10-CM

## 2010-08-06 DIAGNOSIS — I780 Hereditary hemorrhagic telangiectasia: Secondary | ICD-10-CM

## 2010-08-07 ENCOUNTER — Encounter (HOSPITAL_BASED_OUTPATIENT_CLINIC_OR_DEPARTMENT_OTHER): Payer: Medicare Other | Admitting: Hematology and Oncology

## 2010-08-07 DIAGNOSIS — D509 Iron deficiency anemia, unspecified: Secondary | ICD-10-CM

## 2010-08-07 DIAGNOSIS — I780 Hereditary hemorrhagic telangiectasia: Secondary | ICD-10-CM

## 2010-08-08 ENCOUNTER — Encounter (HOSPITAL_COMMUNITY): Payer: Medicare Other

## 2010-08-12 ENCOUNTER — Encounter (HOSPITAL_COMMUNITY): Payer: Medicare Other | Attending: Hematology and Oncology

## 2010-08-12 DIAGNOSIS — D649 Anemia, unspecified: Secondary | ICD-10-CM | POA: Insufficient documentation

## 2010-08-18 ENCOUNTER — Encounter (HOSPITAL_COMMUNITY): Payer: Medicare Other

## 2010-09-19 ENCOUNTER — Emergency Department (HOSPITAL_COMMUNITY)
Admission: EM | Admit: 2010-09-19 | Discharge: 2010-09-20 | Disposition: A | Discharge status: Home / Self Care | Payer: Medicare Other | Attending: Emergency Medicine | Admitting: Emergency Medicine

## 2010-09-19 ENCOUNTER — Inpatient Hospital Stay (INDEPENDENT_AMBULATORY_CARE_PROVIDER_SITE_OTHER)
Admission: RE | Admit: 2010-09-19 | Discharge: 2010-09-19 | Disposition: A | Discharge status: Home / Self Care | Payer: Medicare Other | Source: Ambulatory Visit | Attending: Family Medicine | Admitting: Family Medicine

## 2010-09-19 ENCOUNTER — Emergency Department (HOSPITAL_COMMUNITY): Payer: Medicare Other

## 2010-09-19 DIAGNOSIS — Z794 Long term (current) use of insulin: Secondary | ICD-10-CM | POA: Insufficient documentation

## 2010-09-19 DIAGNOSIS — K219 Gastro-esophageal reflux disease without esophagitis: Secondary | ICD-10-CM | POA: Insufficient documentation

## 2010-09-19 DIAGNOSIS — R040 Epistaxis: Secondary | ICD-10-CM | POA: Insufficient documentation

## 2010-09-19 DIAGNOSIS — D649 Anemia, unspecified: Secondary | ICD-10-CM | POA: Insufficient documentation

## 2010-09-19 DIAGNOSIS — R5381 Other malaise: Secondary | ICD-10-CM | POA: Insufficient documentation

## 2010-09-19 DIAGNOSIS — E119 Type 2 diabetes mellitus without complications: Secondary | ICD-10-CM | POA: Insufficient documentation

## 2010-09-19 DIAGNOSIS — D500 Iron deficiency anemia secondary to blood loss (chronic): Secondary | ICD-10-CM

## 2010-09-19 DIAGNOSIS — L439 Lichen planus, unspecified: Secondary | ICD-10-CM | POA: Insufficient documentation

## 2010-09-19 DIAGNOSIS — Z79899 Other long term (current) drug therapy: Secondary | ICD-10-CM | POA: Insufficient documentation

## 2010-09-19 DIAGNOSIS — R5383 Other fatigue: Secondary | ICD-10-CM | POA: Insufficient documentation

## 2010-09-19 DIAGNOSIS — E785 Hyperlipidemia, unspecified: Secondary | ICD-10-CM | POA: Insufficient documentation

## 2010-09-19 LAB — BASIC METABOLIC PANEL
BUN: 11 mg/dL (ref 6–23)
CO2: 29 mEq/L (ref 19–32)
Chloride: 104 mEq/L (ref 96–112)
Creatinine, Ser: 0.66 mg/dL (ref 0.50–1.10)
GFR calc Af Amer: >60 mL/min (ref >60)
Glucose, Bld: 284 mg/dL — ABNORMAL HIGH (ref 70–99)
Potassium: 3.9 mEq/L (ref 3.5–5.1)

## 2010-09-19 LAB — URINALYSIS, ROUTINE W REFLEX MICROSCOPIC
Bilirubin Urine: NEGATIVE
Hgb urine dipstick: NEGATIVE
Ketones, ur: NEGATIVE mg/dL
Specific Gravity, Urine: 1.008 (ref 1.005–1.030)
pH: 7 (ref 5.0–8.0)

## 2010-09-19 LAB — POCT I-STAT, CHEM 8
BUN: 9 mg/dL (ref 6–23)
Calcium, Ion: 1.24 mmol/L (ref 1.12–1.32)
Chloride: 104 mEq/L (ref 96–112)
Glucose, Bld: 162 mg/dL — ABNORMAL HIGH (ref 70–99)
HCT: 24 % — ABNORMAL LOW (ref 36.0–46.0)
Potassium: 3.9 mEq/L (ref 3.5–5.1)

## 2010-09-19 LAB — POCT URINALYSIS DIP (DEVICE)
Bilirubin Urine: NEGATIVE
Glucose, UA: NEGATIVE mg/dL
Hgb urine dipstick: NEGATIVE
Ketones, ur: NEGATIVE mg/dL
Nitrite: NEGATIVE
pH: 7.5 (ref 5.0–8.0)

## 2010-09-19 LAB — CBC
HCT: 25.5 % — ABNORMAL LOW (ref 36.0–46.0)
MCH: 26.1 pg (ref 26.0–34.0)
MCV: 83.1 fL (ref 78.0–100.0)
RBC: 3.07 MIL/uL — ABNORMAL LOW (ref 3.87–5.11)
WBC: 4.9 10*3/uL (ref 4.0–10.5)

## 2010-09-19 LAB — DIFFERENTIAL
Eosinophils Absolute: 0.3 10*3/uL (ref 0.0–0.7)
Eosinophils Relative: 5 % (ref 0–5)
Lymphocytes Relative: 15 % (ref 12–46)
Lymphs Abs: 0.7 10*3/uL (ref 0.7–4.0)
Monocytes Relative: 10 % (ref 3–12)
Neutrophils Relative %: 70 % (ref 43–77)

## 2010-09-21 LAB — CROSSMATCH
Antibody Screen: NEGATIVE
Unit division: 0

## 2010-09-23 ENCOUNTER — Other Ambulatory Visit: Payer: Self-pay | Admitting: Internal Medicine

## 2010-09-23 DIAGNOSIS — Z1231 Encounter for screening mammogram for malignant neoplasm of breast: Secondary | ICD-10-CM

## 2010-09-26 ENCOUNTER — Ambulatory Visit
Admission: RE | Admit: 2010-09-26 | Discharge: 2010-09-26 | Disposition: A | Discharge status: Home / Self Care | Payer: Medicare Other | Source: Ambulatory Visit | Attending: Internal Medicine | Admitting: Internal Medicine

## 2010-09-26 DIAGNOSIS — Z1231 Encounter for screening mammogram for malignant neoplasm of breast: Secondary | ICD-10-CM

## 2010-09-30 ENCOUNTER — Encounter (HOSPITAL_BASED_OUTPATIENT_CLINIC_OR_DEPARTMENT_OTHER): Payer: Medicare Other | Admitting: Hematology and Oncology

## 2010-09-30 ENCOUNTER — Other Ambulatory Visit: Payer: Self-pay | Admitting: Hematology and Oncology

## 2010-09-30 DIAGNOSIS — D509 Iron deficiency anemia, unspecified: Secondary | ICD-10-CM

## 2010-09-30 DIAGNOSIS — I780 Hereditary hemorrhagic telangiectasia: Secondary | ICD-10-CM

## 2010-09-30 LAB — CBC WITH DIFFERENTIAL/PLATELET
BASO%: 0.4 % (ref 0.0–2.0)
Basophils Absolute: 0 10*3/uL (ref 0.0–0.1)
LYMPH%: 15.2 % (ref 14.0–49.7)
MCV: 77.6 fL — ABNORMAL LOW (ref 79.5–101.0)
MONO#: 0.4 10*3/uL (ref 0.1–0.9)
NEUT#: 2 10*3/uL (ref 1.5–6.5)
NEUT%: 60.9 % (ref 38.4–76.8)
RBC: 2.87 10*6/uL — ABNORMAL LOW (ref 3.70–5.45)
RDW: 21.7 % — ABNORMAL HIGH (ref 11.2–14.5)
lymph#: 0.5 10*3/uL — ABNORMAL LOW (ref 0.9–3.3)

## 2010-09-30 LAB — BASIC METABOLIC PANEL
BUN: 17 mg/dL (ref 6–23)
CO2: 25 mEq/L (ref 19–32)
Calcium: 8.8 mg/dL (ref 8.4–10.5)
Chloride: 102 mEq/L (ref 96–112)
Creatinine, Ser: 0.74 mg/dL (ref 0.50–1.10)

## 2010-09-30 LAB — IRON AND TIBC: TIBC: 297 ug/dL (ref 250–470)

## 2010-10-01 ENCOUNTER — Other Ambulatory Visit: Payer: Self-pay | Admitting: Internal Medicine

## 2010-10-01 DIAGNOSIS — R928 Other abnormal and inconclusive findings on diagnostic imaging of breast: Secondary | ICD-10-CM

## 2010-10-07 ENCOUNTER — Ambulatory Visit
Admission: RE | Admit: 2010-10-07 | Discharge: 2010-10-07 | Disposition: A | Discharge status: Home / Self Care | Payer: Medicare Other | Source: Ambulatory Visit | Attending: Internal Medicine | Admitting: Internal Medicine

## 2010-10-07 DIAGNOSIS — R928 Other abnormal and inconclusive findings on diagnostic imaging of breast: Secondary | ICD-10-CM

## 2010-10-17 ENCOUNTER — Emergency Department (HOSPITAL_COMMUNITY)
Admission: EM | Admit: 2010-10-17 | Discharge: 2010-10-18 | Disposition: A | Discharge status: Home / Self Care | Payer: Medicare Other | Attending: Emergency Medicine | Admitting: Emergency Medicine

## 2010-10-17 ENCOUNTER — Encounter (HOSPITAL_BASED_OUTPATIENT_CLINIC_OR_DEPARTMENT_OTHER): Payer: Medicare Other | Admitting: Hematology and Oncology

## 2010-10-17 ENCOUNTER — Other Ambulatory Visit: Payer: Self-pay | Admitting: Hematology and Oncology

## 2010-10-17 DIAGNOSIS — D509 Iron deficiency anemia, unspecified: Secondary | ICD-10-CM

## 2010-10-17 DIAGNOSIS — D649 Anemia, unspecified: Secondary | ICD-10-CM | POA: Insufficient documentation

## 2010-10-17 DIAGNOSIS — K219 Gastro-esophageal reflux disease without esophagitis: Secondary | ICD-10-CM | POA: Insufficient documentation

## 2010-10-17 DIAGNOSIS — I780 Hereditary hemorrhagic telangiectasia: Secondary | ICD-10-CM

## 2010-10-17 DIAGNOSIS — Z794 Long term (current) use of insulin: Secondary | ICD-10-CM | POA: Insufficient documentation

## 2010-10-17 DIAGNOSIS — E119 Type 2 diabetes mellitus without complications: Secondary | ICD-10-CM | POA: Insufficient documentation

## 2010-10-17 DIAGNOSIS — K921 Melena: Secondary | ICD-10-CM | POA: Insufficient documentation

## 2010-10-17 DIAGNOSIS — I10 Essential (primary) hypertension: Secondary | ICD-10-CM | POA: Insufficient documentation

## 2010-10-17 DIAGNOSIS — K5520 Angiodysplasia of colon without hemorrhage: Secondary | ICD-10-CM | POA: Insufficient documentation

## 2010-10-17 LAB — DIFFERENTIAL
Basophils Absolute: 0.1 10*3/uL (ref 0.0–0.1)
Basophils Relative: 1 % (ref 0–1)
Myelocytes: 0 %
Neutro Abs: 3.9 10*3/uL (ref 1.7–7.7)
Neutrophils Relative %: 74 % (ref 43–77)
Promyelocytes Absolute: 0 %

## 2010-10-17 LAB — CBC WITH DIFFERENTIAL/PLATELET
BASO%: 0.6 % (ref 0.0–2.0)
EOS%: 5.6 % (ref 0.0–7.0)
MCH: 22.4 pg — ABNORMAL LOW (ref 25.1–34.0)
MCHC: 31.2 g/dL — ABNORMAL LOW (ref 31.5–36.0)
MCV: 71.9 fL — ABNORMAL LOW (ref 79.5–101.0)
MONO%: 12.9 % (ref 0.0–14.0)
RBC: 2.8 10*6/uL — ABNORMAL LOW (ref 3.70–5.45)
RDW: 22.7 % — ABNORMAL HIGH (ref 11.2–14.5)

## 2010-10-17 LAB — BASIC METABOLIC PANEL
CO2: 25 mEq/L (ref 19–32)
Chloride: 105 mEq/L (ref 96–112)
Glucose, Bld: 176 mg/dL — ABNORMAL HIGH (ref 70–99)
Potassium: 3.6 mEq/L (ref 3.5–5.1)
Sodium: 140 mEq/L (ref 135–145)

## 2010-10-17 LAB — CBC
Hemoglobin: 6.8 g/dL — CL (ref 12.0–15.0)
MCH: 21.9 pg — ABNORMAL LOW (ref 26.0–34.0)
RBC: 3.15 MIL/uL — ABNORMAL LOW (ref 3.87–5.11)

## 2010-10-17 LAB — APTT: aPTT: 33 seconds (ref 24–37)

## 2010-10-17 LAB — PROTIME-INR: INR: 1.13 (ref 0.00–1.49)

## 2010-10-18 LAB — CROSSMATCH
ABO/RH(D): A POS
Antibody Screen: NEGATIVE

## 2010-10-24 ENCOUNTER — Encounter (HOSPITAL_BASED_OUTPATIENT_CLINIC_OR_DEPARTMENT_OTHER): Payer: Medicare Other | Admitting: Hematology and Oncology

## 2010-10-24 DIAGNOSIS — D509 Iron deficiency anemia, unspecified: Secondary | ICD-10-CM

## 2010-10-24 DIAGNOSIS — I780 Hereditary hemorrhagic telangiectasia: Secondary | ICD-10-CM

## 2010-10-31 LAB — CROSSMATCH

## 2010-10-31 LAB — CBC
HCT: 18.2 — ABNORMAL LOW
HCT: 23.3 — ABNORMAL LOW
HCT: 23.9 — ABNORMAL LOW
Hemoglobin: 5.7 — CL
Hemoglobin: 7.4 — CL
MCHC: 32.4
MCHC: 31
MCV: 66 — ABNORMAL LOW
MCV: 75.5 — ABNORMAL LOW
MCV: 77.2 — ABNORMAL LOW
Platelets: 190
Platelets: 504 — ABNORMAL HIGH
RBC: 3.09 — ABNORMAL LOW
RBC: 3.1 — ABNORMAL LOW
RDW: 23.1 — ABNORMAL HIGH
RDW: 26.3 — ABNORMAL HIGH
WBC: 7.4

## 2010-10-31 LAB — COMPREHENSIVE METABOLIC PANEL WITH GFR
AST: 30
Albumin: 3.5
BUN: 10
Calcium: 9.1
Chloride: 111
Creatinine, Ser: 0.69
GFR calc Af Amer: >60
Total Protein: 5.7 — ABNORMAL LOW

## 2010-10-31 LAB — DIFFERENTIAL
Basophils Absolute: 0
Basophils Relative: 1
Eosinophils Absolute: 0.3
Eosinophils Relative: 5
Lymphocytes Relative: 11 — ABNORMAL LOW
Lymphs Abs: 0.8
Monocytes Absolute: 0.5
Monocytes Relative: 7
Neutro Abs: 5.7
Neutrophils Relative %: 77

## 2010-10-31 LAB — COMPREHENSIVE METABOLIC PANEL
ALT: 23
Alkaline Phosphatase: 79
CO2: 26
GFR calc non Af Amer: >60
Glucose, Bld: 126 — ABNORMAL HIGH
Potassium: 3.8
Sodium: 144
Total Bilirubin: 0.7

## 2010-10-31 LAB — URINALYSIS, ROUTINE W REFLEX MICROSCOPIC
Ketones, ur: NEGATIVE
Nitrite: NEGATIVE
Specific Gravity, Urine: 1.024
pH: 6

## 2010-10-31 LAB — TYPE AND SCREEN
ABO/RH(D): A POS
Antibody Screen: NEGATIVE

## 2010-11-07 LAB — OCCULT BLOOD X 1 CARD TO LAB, STOOL: Fecal Occult Bld: POSITIVE

## 2010-11-07 LAB — POCT I-STAT, CHEM 8
BUN: 12
Calcium, Ion: 1.16
TCO2: 27

## 2010-11-14 LAB — CBC
HCT: 19.8 — ABNORMAL LOW
HCT: 24.6 — ABNORMAL LOW
MCHC: 32.8
MCV: 81.4
MCV: 82.2
RBC: 2.43 — ABNORMAL LOW
RBC: 2.99 — ABNORMAL LOW
WBC: 5.4

## 2010-11-14 LAB — TYPE AND SCREEN
ABO/RH(D): A POS
Antibody Screen: NEGATIVE

## 2010-11-14 LAB — BASIC METABOLIC PANEL
BUN: 8
CO2: 27
Chloride: 105
Chloride: 107
Creatinine, Ser: 0.77
GFR calc Af Amer: >60
Potassium: 3.8
Potassium: 4.1
Sodium: 141

## 2010-11-14 LAB — URINALYSIS, ROUTINE W REFLEX MICROSCOPIC
Glucose, UA: NEGATIVE
Specific Gravity, Urine: 1.017
pH: 7.5

## 2010-11-14 LAB — DIFFERENTIAL
Basophils Relative: 0
Eosinophils Relative: 5
Lymphs Abs: 0.8
Monocytes Absolute: 0.5
Monocytes Relative: 9

## 2010-11-14 LAB — I-STAT 8, (EC8 V) (CONVERTED LAB)
BUN: 15
Bicarbonate: 27 — ABNORMAL HIGH
Glucose, Bld: 219 — ABNORMAL HIGH
Operator id: 235561
pCO2, Ven: 43.2 — ABNORMAL LOW
pH, Ven: 7.404 — ABNORMAL HIGH

## 2010-11-14 LAB — OCCULT BLOOD X 1 CARD TO LAB, STOOL: Fecal Occult Bld: POSITIVE

## 2010-11-18 LAB — CROSSMATCH
ABO/RH(D): A POS
Antibody Screen: NEGATIVE

## 2010-11-18 LAB — I-STAT 8, (EC8 V) (CONVERTED LAB)
Acid-base deficit: 1
BUN: 20
Bicarbonate: 22.4
Chloride: 107
Glucose, Bld: 251 — ABNORMAL HIGH
HCT: 25 — ABNORMAL LOW
Hemoglobin: 8.5 — ABNORMAL LOW
Operator id: 239701
Potassium: 3.9
Sodium: 140
TCO2: 23
pCO2, Ven: 32.9 — ABNORMAL LOW
pH, Ven: 7.441 — ABNORMAL HIGH

## 2010-11-18 LAB — CBC
HCT: 23.1 — ABNORMAL LOW
HCT: 23.1 — ABNORMAL LOW
HCT: 31.1 — ABNORMAL LOW
Hemoglobin: 10.5 — ABNORMAL LOW
Hemoglobin: 7.6 — CL
Hemoglobin: 7.7 — CL
MCHC: 33.4
MCHC: 33.8
MCV: 89.6
MCV: 90.6
MCV: 91.3
Platelets: 228
Platelets: 241
Platelets: 245
RBC: 2.53 — ABNORMAL LOW
RBC: 2.55 — ABNORMAL LOW
RBC: 3.48 — ABNORMAL LOW
RDW: 16.2 — ABNORMAL HIGH
RDW: 17 — ABNORMAL HIGH
WBC: 4.6
WBC: 5.1
WBC: 5.3

## 2010-11-19 ENCOUNTER — Encounter: Payer: Self-pay | Admitting: *Deleted

## 2010-11-21 LAB — DIFFERENTIAL
Basophils Absolute: 0
Basophils Absolute: 0.1
Basophils Relative: 2 — ABNORMAL HIGH
Eosinophils Absolute: 0.2
Eosinophils Absolute: 0.3
Eosinophils Relative: 4
Eosinophils Relative: 5
Lymphocytes Relative: 13
Lymphs Abs: 0.9
Monocytes Absolute: 0.5
Neutrophils Relative %: 77

## 2010-11-21 LAB — CBC
HCT: 22.7 — ABNORMAL LOW
HCT: 28.3 — ABNORMAL LOW
Hemoglobin: 7.3 — CL
Hemoglobin: 7.9 — CL
MCHC: 32.1
MCHC: 32.2
MCV: 72.3 — ABNORMAL LOW
MCV: 73.9 — ABNORMAL LOW
MCV: 75.2 — ABNORMAL LOW
Platelets: 292
Platelets: 324
RBC: 3.26 — ABNORMAL LOW
RDW: 19 — ABNORMAL HIGH
RDW: 20 — ABNORMAL HIGH
WBC: 7.4

## 2010-11-21 LAB — I-STAT 8, (EC8 V) (CONVERTED LAB)
Acid-base deficit: 3 — ABNORMAL HIGH
BUN: 14
Bicarbonate: 22.2
Bicarbonate: 22.7
Glucose, Bld: 262 — ABNORMAL HIGH
HCT: 31 — ABNORMAL LOW
Hemoglobin: 10.5 — ABNORMAL LOW
Hemoglobin: 8.5 — ABNORMAL LOW
Operator id: 247071
Sodium: 138
Sodium: 140
TCO2: 23
pCO2, Ven: 37.6 — ABNORMAL LOW
pH, Ven: 7.398 — ABNORMAL HIGH

## 2010-11-21 LAB — CROSSMATCH: Antibody Screen: NEGATIVE

## 2010-11-21 LAB — POCT URINALYSIS DIP (DEVICE)
Operator id: 247071
Protein, ur: NEGATIVE
Urobilinogen, UA: 0.2
pH: 5.5

## 2010-11-22 NOTE — Consult Note (Signed)
NAMEFLORINA, [name]                ACCOUNT NO.:  [id number]  MEDICAL RECORD NO.:  [id number]  LOCATION:  [facility]                         FACILITY:  [facility]  PHYSICIAN:  Barbette Merino, M.D.      DATE OF BIRTH:  1941-01-16  DATE OF CONSULTATION: DATE OF DISCHARGE:  09/20/2010                                CONSULTATION   PRIMARY CARE PHYSICIAN:  [name] Ebbs, MD  PRESENTING COMPLAINT:  Generalized weakness.  HISTORY OF PRESENT ILLNESS:  The patient is a 70 year old female with known history of colonic cholangiectasia and chronic anemia who has had multiple transfusions in the past.  She also known to have AVM in the past.  She has had multiple transfusions as indicated.  She came into the ED secondary to generalized weakness.  Part of our evaluation showed a hemoglobin of 8.2 as the only abnormal findings, otherwise she is doing fine.  She denies any pain.  Notes no GI or GU symptoms.  The patient's prior hemoglobin was lower than 8.2 when she got transfuse before.  PAST MEDICAL HISTORY: 1. Diabetes. 2. Hypertension. 3. Chronic anemia. 4. GERD. 5. Hyperlipidemia. 6. GI bleed, possibly AVM.  ALLERGIES:  ASPIRIN that causes nausea.  MEDICATIONS:  Lantus, lisinopril, and metformin.  SOCIAL HISTORY:  She lives in [place].  Denied tobacco, alcohol, or IV drug use.  She was a former smoker.  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  FAMILY HISTORY:  Noncontributory.  REVIEW OF SYSTEMS:  All systems are reviewed and are negative except per HPI.  PHYSICAL EXAMINATION:  VITAL SIGNS:  Temperature 98.8, blood pressure was 160/65, pulse 80, respiratory rate 16, and sats 95% on room air. GENERAL:  She is awake, alert, and oriented, no acute distress. HEENT:  PERRL.  EOMI.  No pallor, no jaundice, no rhinorrhea. NECK:  Supple.  No JVD, no lymphadenopathy. RESPIRATORY:  She has good air entry bilaterally.  No wheezes, no rales. CARDIOVASCULAR:  S1 and S2.  No murmur. ABDOMEN:   Soft, full, nontender with positive bowel sounds. EXTREMITIES:  No edema, cyanosis, or clubbing. SKIN:  No rashes.  No ulcers.  Stool guaiac is negative.  Urinalysis is negative.  Sodium 143, potassium 3.9, chloride 104, BUN 9, creatinine 0.80, glucose 162, ionized calcium 1.24.  Her hemoglobin is again 8.2.  ASSESSMENT:  This is a 70 year old female presenting with weakness and anemia.  It looks like she has iron deficiency anemia based on her prior history.  At this point, her hemoglobin is stable and there is no evidence of GI bleed.  The patient also wants to go home.  RECOMMENDATIONS:  Weakness, more than likely from her anemia.  The patient will be discharged home to follow up on Monday with her primary care physician for repeat CBC.  If her hemoglobin drops to below 7, she will probably need to be transfused.  Otherwise, at this point, I will recommend iron therapy only.  The patient should take over-the-counter iron days daily and continued for close followup by her primary care physician.     Barbette Merino, M.D.     Scot Dock  D:  09/20/2010  T:  09/20/2010  Job:  [id number] Electronically Signed by Barbette Merino M.D. on 11/22/2010 02:50:45 PM

## 2010-11-24 LAB — PROTIME-INR
INR: 1
Prothrombin Time: 13.5
Prothrombin Time: 13.5

## 2010-11-24 LAB — URINALYSIS, ROUTINE W REFLEX MICROSCOPIC
Glucose, UA: NEGATIVE
Ketones, ur: NEGATIVE
Ketones, ur: NEGATIVE
Leukocytes, UA: NEGATIVE
Nitrite: NEGATIVE
Protein, ur: NEGATIVE
Specific Gravity, Urine: 1.022
pH: 6

## 2010-11-24 LAB — DIFFERENTIAL
Basophils Relative: 0
Eosinophils Absolute: 0.3
Lymphs Abs: 1.1
Neutrophils Relative %: 73

## 2010-11-24 LAB — URINE MICROSCOPIC-ADD ON

## 2010-11-24 LAB — CROSSMATCH
ABO/RH(D): A POS
Antibody Screen: NEGATIVE

## 2010-11-24 LAB — CBC
MCV: 73.7 — ABNORMAL LOW
MCV: 76.7 — ABNORMAL LOW
Platelets: 329
Platelets: 401 — ABNORMAL HIGH
Platelets: 230
WBC: 6.9
WBC: 8.2
WBC: 5.8

## 2010-11-24 LAB — I-STAT 8, (EC8 V) (CONVERTED LAB)
BUN: 11
Chloride: 109
pCO2, Ven: 37.9 — ABNORMAL LOW
pH, Ven: 7.377 — ABNORMAL HIGH

## 2010-11-24 LAB — GC/CHLAMYDIA PROBE AMP, GENITAL
Chlamydia, DNA Probe: NEGATIVE
GC Probe Amp, Genital: NEGATIVE

## 2010-11-24 LAB — APTT: aPTT: 30

## 2010-11-24 LAB — BASIC METABOLIC PANEL
BUN: 13
Creatinine, Ser: 0.72
GFR calc non Af Amer: >60

## 2010-11-24 LAB — RPR: RPR Ser Ql: NONREACTIVE

## 2010-11-24 LAB — POCT I-STAT CREATININE: Creatinine, Ser: 0.8

## 2010-11-27 LAB — BASIC METABOLIC PANEL
BUN: 14
CO2: 26
Calcium: 9.5
Creatinine, Ser: 0.65
GFR calc Af Amer: >60

## 2010-11-27 LAB — CROSSMATCH
ABO/RH(D): A POS
Antibody Screen: NEGATIVE

## 2010-11-27 LAB — CBC
MCHC: 31.9
Platelets: 211
RDW: 16.9 — ABNORMAL HIGH
WBC: 5.4

## 2010-12-12 ENCOUNTER — Encounter: Payer: Self-pay | Admitting: *Deleted

## 2010-12-16 ENCOUNTER — Other Ambulatory Visit (HOSPITAL_BASED_OUTPATIENT_CLINIC_OR_DEPARTMENT_OTHER): Payer: Medicare Other | Admitting: Lab

## 2010-12-16 ENCOUNTER — Other Ambulatory Visit: Payer: Self-pay | Admitting: Hematology and Oncology

## 2010-12-16 DIAGNOSIS — I780 Hereditary hemorrhagic telangiectasia: Secondary | ICD-10-CM

## 2010-12-16 DIAGNOSIS — D509 Iron deficiency anemia, unspecified: Secondary | ICD-10-CM

## 2010-12-16 LAB — CBC WITH DIFFERENTIAL/PLATELET
BASO%: 0.3 % (ref 0.0–2.0)
EOS%: 5.7 % (ref 0.0–7.0)
MCH: 28.4 pg (ref 25.1–34.0)
MCHC: 33.7 g/dL (ref 31.5–36.0)
MCV: 84.3 fL (ref 79.5–101.0)
MONO%: 9 % (ref 0.0–14.0)
RBC: 3.36 10*6/uL — ABNORMAL LOW (ref 3.70–5.45)
RDW: 19 % — ABNORMAL HIGH (ref 11.2–14.5)
lymph#: 0.5 10*3/uL — ABNORMAL LOW (ref 0.9–3.3)

## 2010-12-16 LAB — IRON AND TIBC
Iron: 34 ug/dL — ABNORMAL LOW (ref 42–145)
TIBC: 290 ug/dL (ref 250–470)
UIBC: 256 ug/dL (ref 125–400)

## 2010-12-16 LAB — BASIC METABOLIC PANEL
Chloride: 107 mEq/L (ref 96–112)
Potassium: 3.6 mEq/L (ref 3.5–5.3)

## 2010-12-16 LAB — FERRITIN: Ferritin: 22 ng/mL (ref 10–291)

## 2010-12-18 ENCOUNTER — Telehealth: Payer: Self-pay | Admitting: *Deleted

## 2010-12-18 ENCOUNTER — Other Ambulatory Visit: Payer: Self-pay | Admitting: *Deleted

## 2010-12-18 ENCOUNTER — Encounter: Payer: Medicare Other | Admitting: Hematology and Oncology

## 2010-12-18 NOTE — Progress Notes (Signed)
A user error has taken place: encounter opened in error, closed for administrative reasons.

## 2010-12-18 NOTE — Telephone Encounter (Signed)
Pt  [facility] for f/u appt with md today.    Spoke with pt at home.   Informed pt that a scheduler will contact pt with rescheduled appt date and time in 2013  With md.    Pt voiced understanding.    MD aware.

## 2010-12-19 ENCOUNTER — Telehealth: Payer: Self-pay | Admitting: Hematology and Oncology

## 2010-12-19 NOTE — Telephone Encounter (Signed)
gv pt appts for 11/16, 1/4, + 1/9.

## 2010-12-26 ENCOUNTER — Other Ambulatory Visit: Payer: Self-pay | Admitting: Hematology and Oncology

## 2010-12-26 ENCOUNTER — Ambulatory Visit (HOSPITAL_BASED_OUTPATIENT_CLINIC_OR_DEPARTMENT_OTHER): Payer: Medicare Other

## 2010-12-26 VITALS — BP 137/64 | HR 64 | Temp 97.8°F

## 2010-12-26 DIAGNOSIS — I780 Hereditary hemorrhagic telangiectasia: Secondary | ICD-10-CM

## 2010-12-26 DIAGNOSIS — D509 Iron deficiency anemia, unspecified: Secondary | ICD-10-CM

## 2010-12-26 MED ORDER — ACETAMINOPHEN 325 MG PO TABS
650.0000 mg | ORAL_TABLET | Freq: Four times a day (QID) | ORAL | Status: DC | PRN
  Administered 2010-12-26: 650 mg via ORAL

## 2010-12-26 MED ORDER — DIPHENHYDRAMINE HCL 25 MG PO TABS
25.0000 mg | ORAL_TABLET | Freq: Once | ORAL | Status: AC
  Administered 2010-12-26: 25 mg via ORAL
  Filled 2010-12-26: qty 1

## 2010-12-26 MED ORDER — IRON DEXTRAN 50 MG/ML IJ SOLN: 50.0000 mg | Freq: Once | INTRAMUSCULAR | Status: DC

## 2010-12-26 MED ORDER — SODIUM CHLORIDE 0.9 % IV SOLN: 1400.0000 mg | Freq: Once | INTRAVENOUS | Status: DC

## 2010-12-26 MED ORDER — SODIUM CHLORIDE 0.9 % IV SOLN
1400.0000 mg | Freq: Once | INTRAVENOUS | Status: DC
  Administered 2010-12-26: 1400 mg via INTRAVENOUS

## 2011-01-11 ENCOUNTER — Encounter (HOSPITAL_COMMUNITY): Payer: Self-pay | Admitting: *Deleted

## 2011-01-11 ENCOUNTER — Emergency Department (HOSPITAL_COMMUNITY)
Admission: EM | Admit: 2011-01-11 | Discharge: 2011-01-11 | Disposition: A | Discharge status: Home / Self Care | Payer: Medicare Other | Source: Home / Self Care

## 2011-01-11 DIAGNOSIS — J069 Acute upper respiratory infection, unspecified: Secondary | ICD-10-CM

## 2011-01-11 MED ORDER — CEPHALEXIN 500 MG PO CAPS: 500.0000 mg | ORAL_CAPSULE | Freq: Three times a day (TID) | ORAL | Status: AC

## 2011-01-11 NOTE — ED Provider Notes (Signed)
Medical screening examination/treatment/procedure(s) were performed by non-physician practitioner and as supervising physician I was immediately available for consultation/collaboration.  Howell Rucks, MD 01/11/11 2032

## 2011-01-11 NOTE — ED Provider Notes (Signed)
History     CSN: [id number] Arrival date & time: 01/11/2011  5:22 PM   None     Chief Complaint  Patient presents with  . Nasal Congestion    (Consider location/radiation/quality/duration/timing/severity/associated sxs/prior treatment) HPI Comments: Onset of nasal congestion and cough 3 days ago. Nasal mucus is thick and yellow/green. Cough - improving, no dyspnea or wheezing. Had sore throat at onset - resolved. Using an over the counter cough medication and tea for symptoms and helps.   Patient is a 70 y.o. female presenting with URI. The history is provided by the patient.  URI The primary symptoms include cough. Primary symptoms do not include fever, fatigue, headaches, ear pain, sore throat, wheezing, nausea, vomiting or myalgias. Primary symptoms comment: nasal congestion The current episode started 3 to 5 days ago. This is a new problem. The problem has not changed since onset. The cough began 3 to 5 days ago. The cough is new. The cough is productive. There is nondescript sputum produced.  Symptoms associated with the illness include chills, facial pain, congestion and rhinorrhea. The illness is not associated with sinus pressure. Risk factors for severe complications from URI include being elderly and diabetes mellitus.    Past Medical History  Diagnosis Date  . Anemia   . S/P endoscopy     with laser treatment  08/19/2006  . History of epistaxis   . Diabetes mellitus     insulin requiring.  Marland Kitchen Hypertension   . Hyperlipidemia   . Telangiectasia     Gastric   . Lichen planus     Both lower extremities    Past Surgical History  Procedure Date  . Nose surgery     No family history on file.  History  Substance Use Topics  . Smoking status: Former Research scientist (life sciences)  . Smokeless tobacco: Not on file  . Alcohol Use:     OB History    Grav Para Term Preterm Abortions TAB SAB Ect Mult Living                  Review of Systems  Constitutional: Positive for chills.  Negative for fever and fatigue.  HENT: Positive for congestion and rhinorrhea. Negative for ear pain, sore throat, sneezing, postnasal drip and sinus pressure.   Respiratory: Positive for cough. Negative for shortness of breath and wheezing.   Cardiovascular: Negative for chest pain and palpitations.  Gastrointestinal: Negative for nausea and vomiting.  Musculoskeletal: Negative for myalgias.  Neurological: Negative for headaches.    Allergies  Aspirin  Home Medications   Current Outpatient Rx  Name Route Sig Dispense Refill  . ENALAPRIL MALEATE 5 MG PO TABS Oral Take 5 mg by mouth daily.      Marland Kitchen HYDRALAZINE HCL 10 MG PO TABS Oral Take 10 mg by mouth as needed.      . INSULIN GLARGINE 100 UNIT/ML [place] SOLN Subcutaneous Inject 46 Units into the skin daily.      Marland Kitchen METFORMIN HCL 1000 MG PO TABS Oral Take 1,000 mg by mouth daily.      Marland Kitchen PANTOPRAZOLE SODIUM 40 MG PO TBEC Oral Take 40 mg by mouth daily.      Marland Kitchen SALINE NASAL SPRAY 0.65 % NA SOLN Nasal Place 1 spray into the nose daily.      Marland Kitchen VITAMIN B-12 1000 MCG PO TABS Oral Take 1,000 mcg by mouth daily.        BP 160/68  Pulse 68  Temp(Src) 98.3 F (36.8 C) (  Oral)  Resp 18  SpO2 100%  Physical Exam  Constitutional: She appears well-developed and well-nourished. No distress.  HENT:  Head: Normocephalic and atraumatic.  Right Ear: Tympanic membrane, external ear and ear canal normal.  Left Ear: Tympanic membrane, external ear and ear canal normal.  Nose: Nose normal.  Mouth/Throat: Uvula is midline, oropharynx is clear and moist and mucous membranes are normal. No oropharyngeal exudate, posterior oropharyngeal edema or posterior oropharyngeal erythema.  Neck: Neck supple.  Cardiovascular: Normal rate, regular rhythm and normal heart sounds.   Pulmonary/Chest: Effort normal and breath sounds normal. No respiratory distress.  Lymphadenopathy:    She has no cervical adenopathy.  Neurological: She is alert.  Skin: Skin is warm and  dry.  Psychiatric: She has a normal mood and affect.    ED Course  Procedures (including critical care time)  Labs Reviewed - No data to display No results found.   No diagnosis found.    [place], [place] 01/11/11 [phone]

## 2011-01-11 NOTE — ED Notes (Signed)
C/O head congestion and pressure x 4 days with slight cough.  Unsure if fevers.  Has tried cough drops and OTC cough syrup.

## 2011-01-31 ENCOUNTER — Inpatient Hospital Stay (HOSPITAL_COMMUNITY)
Admission: EM | Admit: 2011-01-31 | Discharge: 2011-02-01 | DRG: 812 | Disposition: A | Discharge status: Home / Self Care | Payer: Medicare Other | Attending: Internal Medicine | Admitting: Internal Medicine

## 2011-01-31 ENCOUNTER — Encounter (HOSPITAL_COMMUNITY): Payer: Self-pay | Admitting: *Deleted

## 2011-01-31 DIAGNOSIS — Z794 Long term (current) use of insulin: Secondary | ICD-10-CM

## 2011-01-31 DIAGNOSIS — I10 Essential (primary) hypertension: Secondary | ICD-10-CM | POA: Diagnosis present

## 2011-01-31 DIAGNOSIS — K922 Gastrointestinal hemorrhage, unspecified: Secondary | ICD-10-CM

## 2011-01-31 DIAGNOSIS — K921 Melena: Secondary | ICD-10-CM | POA: Diagnosis present

## 2011-01-31 DIAGNOSIS — E876 Hypokalemia: Secondary | ICD-10-CM | POA: Diagnosis present

## 2011-01-31 DIAGNOSIS — I780 Hereditary hemorrhagic telangiectasia: Secondary | ICD-10-CM | POA: Diagnosis present

## 2011-01-31 DIAGNOSIS — E785 Hyperlipidemia, unspecified: Secondary | ICD-10-CM | POA: Diagnosis present

## 2011-01-31 DIAGNOSIS — D62 Acute posthemorrhagic anemia: Secondary | ICD-10-CM

## 2011-01-31 DIAGNOSIS — E119 Type 2 diabetes mellitus without complications: Secondary | ICD-10-CM | POA: Diagnosis present

## 2011-01-31 DIAGNOSIS — K31819 Angiodysplasia of stomach and duodenum without bleeding: Secondary | ICD-10-CM

## 2011-01-31 DIAGNOSIS — D649 Anemia, unspecified: Secondary | ICD-10-CM | POA: Diagnosis present

## 2011-01-31 DIAGNOSIS — D6489 Other specified anemias: Principal | ICD-10-CM | POA: Diagnosis present

## 2011-01-31 DIAGNOSIS — E118 Type 2 diabetes mellitus with unspecified complications: Secondary | ICD-10-CM | POA: Diagnosis present

## 2011-01-31 DIAGNOSIS — Q2733 Arteriovenous malformation of digestive system vessel: Secondary | ICD-10-CM

## 2011-01-31 LAB — URINALYSIS, ROUTINE W REFLEX MICROSCOPIC
Ketones, ur: NEGATIVE mg/dL
Leukocytes, UA: NEGATIVE
Nitrite: NEGATIVE
Protein, ur: NEGATIVE mg/dL

## 2011-01-31 LAB — BASIC METABOLIC PANEL
Chloride: 105 mEq/L (ref 96–112)
GFR calc Af Amer: >90 mL/min (ref >90)
GFR calc non Af Amer: 83 mL/min — ABNORMAL LOW (ref >90)
Potassium: 3.8 mEq/L (ref 3.5–5.1)
Sodium: 139 mEq/L (ref 135–145)

## 2011-01-31 LAB — CBC
MCHC: 33.1 g/dL (ref 30.0–36.0)
Platelets: 227 10*3/uL (ref 150–400)
RDW: 16.8 % — ABNORMAL HIGH (ref 11.5–15.5)
WBC: 6.9 10*3/uL (ref 4.0–10.5)

## 2011-01-31 LAB — OCCULT BLOOD, POC DEVICE: Fecal Occult Bld: POSITIVE

## 2011-01-31 NOTE — ED Provider Notes (Signed)
History     CSN: [id number]  Arrival date & time 01/31/11  1728   First MD Initiated Contact with Patient 01/31/11 1822      Chief Complaint  Patient presents with  . Fatigue    (Consider location/radiation/quality/duration/timing/severity/associated sxs/prior treatment) The history is provided by the patient.   the patient reports increasing generalized fatigue over the past several days and worsening dark/black stools.  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  She has no exertional shortness of breath but does feel fatigued and generally weak.  She's had no fever and chills.  She denies chest pain.  She denies abdominal pain.  She denies nausea and vomiting.  She denies diarrhea.  Denies bright red blood per rectum the  Past Medical History  Diagnosis Date  . Anemia   . S/P endoscopy     with laser treatment  08/19/2006  . History of epistaxis   . Diabetes mellitus     insulin requiring.  Marland Kitchen Hypertension   . Hyperlipidemia   . Telangiectasia     Gastric   . Lichen planus     Both lower extremities    Past Surgical History  Procedure Date  . Nose surgery     No family history on file.  History  Substance Use Topics  . Smoking status: Not on file  . Smokeless tobacco: Not on file  . Alcohol Use: No    OB History    Grav Para Term Preterm Abortions TAB SAB Ect Mult Living                  Review of Systems  All other systems reviewed and are negative.    Allergies  Aspirin  Home Medications   Current Outpatient Rx  Name Route Sig Dispense Refill  . ENALAPRIL MALEATE 5 MG PO TABS Oral Take 5 mg by mouth daily.      Marland Kitchen HYDRALAZINE HCL 10 MG PO TABS Oral Take 10 mg by mouth as needed.      . INSULIN GLARGINE 100 UNIT/ML [place] SOLN Subcutaneous Inject 46 Units into the skin daily.      Marland Kitchen METFORMIN HCL 1000 MG PO TABS Oral Take 1,000 mg by mouth daily.      Marland Kitchen PANTOPRAZOLE  SODIUM 40 MG PO TBEC Oral Take 40 mg by mouth daily.      Marland Kitchen VITAMIN D (ERGOCALCIFEROL) 50000 UNITS PO CAPS Oral Take 50,000 Units by mouth every 7 (seven) days.      Marland Kitchen SALINE NASAL SPRAY 0.65 % NA SOLN Nasal Place 1 spray into the nose daily.      Marland Kitchen VITAMIN B-12 1000 MCG PO TABS Oral Take 1,000 mcg by mouth daily.        BP 156/93  Pulse 84  Temp(Src) 98.9 F (37.2 C) (Oral)  Resp 19  SpO2 97%  Physical Exam  Nursing note and vitals reviewed. Constitutional: She is oriented to person, place, and time. She appears well-developed and well-nourished. No distress.  HENT:  Head: Normocephalic and atraumatic.  Eyes: EOM are normal.  Neck: Normal range of motion.  Cardiovascular: Normal rate, regular rhythm and normal heart sounds.   Pulmonary/Chest: Effort normal and breath sounds normal.  Abdominal: Soft. She exhibits no distension. There is no tenderness.  Genitourinary:       Melena on rectal exam without gross blood  Musculoskeletal: Normal range of motion.  Neurological: She  is alert and oriented to person, place, and time.  Skin: Skin is warm and dry.  Psychiatric: She has a normal mood and affect. Judgment normal.    ED Course  Procedures (including critical care time)  Labs Reviewed  URINALYSIS, ROUTINE W REFLEX MICROSCOPIC - Abnormal; Notable for the following:    Glucose, UA 100 (*)    All other components within normal limits  CBC - Abnormal; Notable for the following:    RBC 2.79 (*)    Hemoglobin 8.1 (*)    HCT 24.5 (*)    RDW 16.8 (*)    All other components within normal limits  BASIC METABOLIC PANEL - Abnormal; Notable for the following:    Glucose, Bld 226 (*)    GFR calc non Af Amer 83 (*)    All other components within normal limits  TYPE AND SCREEN  OCCULT BLOOD, POC DEVICE  OCCULT BLOOD X 1 CARD TO LAB, STOOL   No results found.   1. Anemia   2. GI bleed       MDM  Given the patient's known manner history of AVMs with following hemoglobin  which today is 8.1 height patient would benefit from admission serial CBCs and likely transfusion.  However discussed the case with the hospitalist and asked him to evaluate.        Hoy Morn, MD 01/31/11 2229

## 2011-01-31 NOTE — ED Notes (Signed)
Pt states "I anemic, you know how you feel when your blood counts drop, just a feeling" c/o weakness and fatigue

## 2011-02-01 ENCOUNTER — Encounter (HOSPITAL_COMMUNITY): Payer: Self-pay | Admitting: Family Medicine

## 2011-02-01 DIAGNOSIS — D649 Anemia, unspecified: Secondary | ICD-10-CM | POA: Diagnosis present

## 2011-02-01 DIAGNOSIS — I780 Hereditary hemorrhagic telangiectasia: Secondary | ICD-10-CM | POA: Diagnosis present

## 2011-02-01 DIAGNOSIS — K31819 Angiodysplasia of stomach and duodenum without bleeding: Secondary | ICD-10-CM

## 2011-02-01 DIAGNOSIS — E876 Hypokalemia: Secondary | ICD-10-CM | POA: Diagnosis present

## 2011-02-01 DIAGNOSIS — K922 Gastrointestinal hemorrhage, unspecified: Secondary | ICD-10-CM | POA: Diagnosis present

## 2011-02-01 LAB — IRON AND TIBC
Saturation Ratios: 27 % (ref 20–55)
TIBC: 232 ug/dL — ABNORMAL LOW (ref 250–470)

## 2011-02-01 LAB — BASIC METABOLIC PANEL
Calcium: 9 mg/dL (ref 8.4–10.5)
GFR calc Af Amer: >90 mL/min (ref >90)
GFR calc non Af Amer: 82 mL/min — ABNORMAL LOW (ref >90)
Potassium: 3.2 mEq/L — ABNORMAL LOW (ref 3.5–5.1)
Sodium: 144 mEq/L (ref 135–145)

## 2011-02-01 LAB — HEMOGLOBIN AND HEMATOCRIT, BLOOD
HCT: 27.5 % — ABNORMAL LOW (ref 36.0–46.0)
Hemoglobin: 9.1 g/dL — ABNORMAL LOW (ref 12.0–15.0)

## 2011-02-01 LAB — GLUCOSE, CAPILLARY: Glucose-Capillary: 91 mg/dL (ref 70–99)

## 2011-02-01 LAB — VITAMIN B12: Vitamin B-12: 338 pg/mL (ref 211–911)

## 2011-02-01 LAB — CBC
Hemoglobin: 9 g/dL — ABNORMAL LOW (ref 12.0–15.0)
MCH: 28.4 pg (ref 26.0–34.0)
Platelets: 176 10*3/uL (ref 150–400)
RBC: 3.17 MIL/uL — ABNORMAL LOW (ref 3.87–5.11)
WBC: 5.1 10*3/uL (ref 4.0–10.5)

## 2011-02-01 LAB — FOLATE: Folate: 17.1 ng/mL

## 2011-02-01 LAB — RETICULOCYTES: Retic Ct Pct: 5.6 % — ABNORMAL HIGH (ref 0.4–3.1)

## 2011-02-01 LAB — PREPARE RBC (CROSSMATCH)

## 2011-02-01 MED ORDER — INSULIN ASPART 100 UNIT/ML ~~LOC~~ SOLN
0.0000 [IU] | SUBCUTANEOUS | Status: DC
  Administered 2011-02-01: 2 [IU] via SUBCUTANEOUS
  Administered 2011-02-01: 1 [IU] via SUBCUTANEOUS
  Filled 2011-02-01: qty 3

## 2011-02-01 MED ORDER — INSULIN GLARGINE 100 UNIT/ML ~~LOC~~ SOLN
20.0000 [IU] | Freq: Every day | SUBCUTANEOUS | Status: DC
  Administered 2011-02-01: 20 [IU] via SUBCUTANEOUS
  Filled 2011-02-01: qty 3

## 2011-02-01 MED ORDER — VITAMIN B-12 1000 MCG PO TABS
1000.0000 ug | ORAL_TABLET | Freq: Every day | ORAL | Status: DC
  Administered 2011-02-01: 1000 ug via ORAL
  Filled 2011-02-01: qty 1

## 2011-02-01 MED ORDER — METFORMIN HCL 500 MG PO TABS
1000.0000 mg | ORAL_TABLET | Freq: Every day | ORAL | Status: DC
  Filled 2011-02-01: qty 2

## 2011-02-01 MED ORDER — POTASSIUM CHLORIDE CRYS ER 20 MEQ PO TBCR
40.0000 meq | EXTENDED_RELEASE_TABLET | Freq: Once | ORAL | Status: AC
  Administered 2011-02-01: 40 meq via ORAL
  Filled 2011-02-01: qty 2

## 2011-02-01 MED ORDER — PANTOPRAZOLE SODIUM 40 MG IV SOLR
40.0000 mg | Freq: Two times a day (BID) | INTRAVENOUS | Status: DC
  Administered 2011-02-01: 40 mg via INTRAVENOUS
  Filled 2011-02-01 (×2): qty 40

## 2011-02-01 MED ORDER — ENALAPRIL MALEATE 5 MG PO TABS
5.0000 mg | ORAL_TABLET | Freq: Every day | ORAL | Status: DC
  Administered 2011-02-01: 5 mg via ORAL
  Filled 2011-02-01: qty 1

## 2011-02-01 MED ORDER — PANTOPRAZOLE SODIUM 40 MG IV SOLR
40.0000 mg | Freq: Every day | INTRAVENOUS | Status: DC
  Administered 2011-02-01: 40 mg via INTRAVENOUS
  Filled 2011-02-01 (×2): qty 40

## 2011-02-01 MED ORDER — SALINE NASAL SPRAY 0.65 % NA SOLN
1.0000 | Freq: Every day | NASAL | Status: DC
  Administered 2011-02-01: 1 via NASAL
  Filled 2011-02-01 (×4): qty 0.5

## 2011-02-01 MED ORDER — SODIUM CHLORIDE 0.9 % IJ SOLN: 3.0000 mL | INTRAMUSCULAR | Status: DC | PRN

## 2011-02-01 NOTE — Consult Note (Signed)
Referring Provider: Dr. Altha Harm Rama Primary Care Physician:  Philis Fendt, MD, MD Primary Gastroenterologist:  Dr. Teena Irani  Reason for Consultation:  Recent subacute GI bleed, underlying Osler Titus Dubin Rendu syndrome  HPI: [name] is a 70 y.o. female well-known to our practice for the past 10 years or so. 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   Endoscopic, colonoscopic, and small bowel capsule studies have confirmed that the GI tract lesions appeared to be confined to the stomach, and duodenum, and perhaps the proximal jejunum. In other words, they do not appear to be present in the majority of the small bowel or the colon.  It has now been 2 years since we last saw the patient. Her GI tract blood loss and need for her GI interventions has diminished markedly since she had grafting of nasal lesions by Dr. Radene Journey in April of this year. Furthermore, she is followed at the regional [facility] where she has been getting iron infusions and occasional transfusions. She indicates that her transfusion requirement is now perhaps every several months.  The last time we endoscoped the patient was in April of 2010, at which time it was felt that her bleeding was coming from a nosebleed and not from the AVMs.   A summary of her GI evaluation by our practice is as follows:  January 2003:  colonoscopy by Dr. [name] Plenty showed hyperplastic polyp  July 2006:  endoscopy with BiCAP by Dr. Teena Irani  September 2006:  endoscopy with argon plasma coagulator      therapy by Dr. Penelope Coop  April 2008:  endoscopy with APC by Dr. 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 Teena Irani  April 2010:  endoscopy by Dr. Oletta Lamas with findings of nosebleed and nonbleeding telangiectasia  December  2010:  colonoscopy by Dr. Teena Irani, normal  June 2011:  most recent admission, prior to the current admission, for reason of bleeding and transfusion  April 2012:  full-thickness grafting of nasal bleeding sites by Dr. Radene Journey   With that background, the patient presented at this time with a several day history of dark stools and weakness, and was found to have dark stools with heme positivity on physical examination. She has received 2 units of packed cells overnight, with a suboptimal rise in hemoglobin from 8.1-9.0. Note that her BUN on admission was normal at 17 and remains normal at 13 this morning. The patient feels well and wants to go home.      Vitamin D, Ergocalciferol, (DRISDOL) 50000 UNITS CAPS Take 50,000 Units by mouth every 7 (seven) days.     Yes Historical Provider, MD    Current Facility-Administered Medications  Medication Dose Route Frequency Provider Last Rate Last Dose  . enalapril (VASOTEC) tablet 5 mg  5 mg Oral Daily Debby Crosley, MD      . insulin aspart (novoLOG) injection 0-9 Units  0-9 Units Subcutaneous Q4H Dianne Dun   1 Units at 02/01/11 0403  . insulin glargine (LANTUS) injection 20 Units  20 Units Subcutaneous Daily Debby Crosley, MD      . metFORMIN (GLUCOPHAGE) tablet 1,000 mg  1,000 mg Oral Q breakfast Debby Crosley, MD      . pantoprazole (PROTONIX) injection 40 mg  40 mg Intravenous Q12H Debby Crosley,  MD      . potassium chloride SA (K-DUR,KLOR-CON) CR tablet 40 mEq  40 mEq Oral Once Christina Rama      . sodium chloride (OCEAN) 0.65 % nasal spray 1 spray  1 spray Nasal Daily Debby Crosley, MD      . sodium chloride 0.9 % injection 3 mL  3 mL Intravenous PRN Debby Crosley, MD      . vitamin B-12 (CYANOCOBALAMIN) tablet 1,000 mcg  1,000 mcg Oral Daily Debby Crosley, MD      . DISCONTD: pantoprazole (PROTONIX) injection 40 mg  40 mg Intravenous QHS Debby Crosley, MD   40 mg at 02/01/11 0230    Allergies as of 01/31/2011 - Review  Complete 01/31/2011  Allergen Reaction Noted  . Aspirin  05/12/2006    Family History  Problem Relation Age of Onset  . Breast cancer      History   Social History  . Marital Status: Married    Spouse Name: N/A    Number of Children: N/A  . Years of Education: N/A   Occupational History  . Not on file.   Social History Main Topics  . Smoking status: Never Smoker   . Smokeless tobacco: Not on file  . Alcohol Use: No  . Drug Use: No  . Sexually Active: No   Other Topics Concern  . Not on file   Social History Narrative  . No narrative on file    Review of Systems: Positive for: Occasional nocturnal heartburn   Patient denies dysphagia, abdominal pain, constipation, diarrhea, rectal bleeding, syncope    Physical Exam: Vital signs in last 24 hours: Temp:  [98.5 F (36.9 C)-99.2 F (37.3 C)] 98.7 F (37.1 C) (12/23 0715) Pulse Rate:  [58-88] 65  (12/23 0715) Resp:  [15-19] 18  (12/23 0715) BP: (118-161)/(52-93) 144/76 mmHg (12/23 0715) SpO2:  [97 %-100 %] 100 % (12/23 0715) Weight:  [70.625 kg (155 lb 11.2 oz)] 155 lb 11.2 oz (70.625 kg) (12/23 0100) Last BM Date: 01/31/11 General:  Alert,  Well-developed, well-nourished, pleasant and cooperative in NAD Head:  Normocephalic and atraumatic. Eyes:  Sclera clear, no icterus.   Conjunctiva pink. Mouth:   No ulcerations.  Oropharynx pink & moist.However, she does have telangectatic lesions on her tongue Lungs:  Clear throughout to auscultation.   No wheezes, crackles, or rhonchi. No evident respiratory distress. Heart:   Regular rate and rhythm; no murmurs, clicks, rubs,  or gallops. Abdomen:  Soft, nontender, and nondistended. No masses, hepatosplenomegaly or ventral hernias noted. Normal bowel sounds, without bruits, guarding, or rebound.   Msk:   Symmetrical without gross deformities. Extremities:   Without clubbing, cyanosis Neurologic:  Alert and coherent;  grossly normal neurologically. Skin:  small  telangectatic lesions on fingertips and under the nail beds  Psych:   Alert and cooperative. Normal mood and affect.  Intake/Output from previous day: 12/22 0701 - 12/23 0700 In: 1022.5 [I.V.:500; Blood:512.5; IV Piggyback:10] Out: 350 [Urine:350] Intake/Output this shift:    Lab Results:  Basename 02/01/11 0839 02/01/11 0830 01/31/11 2007  WBC 5.1 -- 6.9  HGB 9.0* 9.1* 8.1*  HCT 26.8* 27.5* 24.5*  PLT 176 -- 227   BMET  Basename 02/01/11 0839 01/31/11 2007  NA 144 139  K 3.2* 3.8  CL 111 105  CO2 26 28  GLUCOSE 81 226*  BUN 13 17  CREATININE 0.79 0.76  CALCIUM 9.0 9.5   LFT No results found for this basename: PROT,ALBUMIN,AST,ALT,ALKPHOS,BILITOT,BILIDIR,IBILI in the last  72 hours PT/INR  Basename 02/01/11 0839  LABPROT 14.6  INR 1.12   Studies/Results: No results found.  Impression:  recurrent low-grade GI bleed without hemodynamic instability, most likely related to her underlying Gwynne Edinger Rendu syndrome. She is not on ulcerogenic medications as an outpatient.  Plan: okay for discharge from the GI tract standpoint.   I have asked the patient to contact our office for followup in the near future.   We discussed the option of endoscopic evaluation today but both the patient and I feel it would be of low yield, and we are somewhat reluctant to perform nonessential interventions a couple of days before Christmas, in case of a complication.   On the other hand, the patient understands that there is a slight chance that she is still having some active low-grade bleeding, and that she would be back in a couple of days with the need for further transfusion. She also understands that treatment of her telangectatic lesions is by no means a definitive guarantee of prevention of further bleeding.   LOS: 1 day   Sarath Privott V  02/01/2011, 11:16 AM

## 2011-02-01 NOTE — Discharge Summary (Addendum)
Physician Discharge Summary  Patient ID: [name] MRN: [id number] DOB/AGE: May 18, 1940 70 y.o.  Admit date: 01/31/2011 Discharge date: 02/01/2011  Primary Care Physician:  Philis Fendt, MD, MD Gastroenterologist: Teena Irani, MD   Discharge Diagnoses:    Present on Admission:  .Anemia .DIABETES MELLITUS II, UNCOMPLICATED .HYPERLIPIDEMIA .HYPERTENSION, BENIGN SYSTEMIC .GI bleed .Hypokalemia .Osler-Weber-Rendu disease  Discharge Medications:  Current Discharge Medication List    CONTINUE these medications which have NOT CHANGED   Details  enalapril (VASOTEC) 5 MG tablet Take 5 mg by mouth daily.      hydrALAZINE (APRESOLINE) 10 MG tablet Take 10 mg by mouth as needed.      insulin glargine (LANTUS) 100 UNIT/ML injection Inject 46 Units into the skin daily.      metFORMIN (GLUCOPHAGE) 1000 MG tablet Take 1,000 mg by mouth daily.      pantoprazole (PROTONIX) 40 MG tablet Take 40 mg by mouth daily.      sodium chloride (OCEAN) 0.65 % nasal spray Place 1 spray into the nose daily.      vitamin B-12 (CYANOCOBALAMIN) 1000 MCG tablet Take 1,000 mcg by mouth daily.      Vitamin D, Ergocalciferol, (DRISDOL) 50000 UNITS CAPS Take 50,000 Units by mouth every 7 (seven) days.           Disposition and Follow-up: The patient is being discharged home.  She is instructed to followup with both her PCP and her gastroenterologist in 2 weeks. She is further instructed to return to the hospital or call EMS if she has any further evidence of significant GI bleeding.  Consults:  1.  Dr. Ronald Lobo, Gastroenterology  Significant Diagnostic Studies:  No results found.  Discharge Laboratory Values: Basic Metabolic Panel:  Lab [id number] 01/31/11 2007  NA 144 139  K 3.2* 3.8  CL 111 105  CO2 26 28  GLUCOSE 81 226*  BUN 13 17  CREATININE 0.79 0.76  CALCIUM 9.0 9.5  MG -- --  PHOS -- --   GFR Estimated Creatinine Clearance: 61.7 ml/min (by C-G formula based on Cr  of 0.79). Coagulation profile  Lab 02/01/11 0839  INR 1.12  PROTIME --    CBC:  Lab 02/01/11 0839 02/01/11 0830 01/31/11 2007  WBC 5.1 -- 6.9  NEUTROABS -- -- --  HGB 9.0* 9.1* 8.1*  HCT 26.8* 27.5* 24.5*  MCV 84.5 -- 87.8  PLT 176 -- 227   CBG:  Lab 02/01/11 0730 02/01/11 0359  GLUCAP 91 128*   Anemia work up  [facility] 02/01/11 0839  VITAMINB12 --  FOLATE --  FERRITIN --  TIBC --  IRON --  RETICCTPCT 5.6*    Brief H and P: For complete details please refer to admission H and P, but in brief, Mrs. 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 She was seen and evaluated in the emergency department and found to have a hemoglobin of 8.1 mg/dL which is slightly lower than her baseline values. Her stools were heme positive. She subsequently was referred to the hospitalist service for further evaluation. Of note, the patient gets regular iron transfusions and frequent blood transfusions at the cancer Center. She was last transfused approximately 4 months prior to admission.  Physical Exam at Discharge: BP 144/76  Pulse 65  Temp(Src) 98.7 F (37.1 C) (Oral)  Resp  18  Ht 5\' 3"  (1.6 m)  Wt 70.625 kg (155 lb 11.2 oz)  BMI 27.58 kg/m2  SpO2 100%  Gen:  NAD Cardiovascular:  RRR, No M/R/G Respiratory: Lungs CTAB Gastrointestinal: Abdomen soft, NT/ND with normal active bowel sounds. 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 Gastroenterology consultation was requested and kindly provided by Dr. Cristina Gong. Patient was felt to be stable for discharge home given the upcoming holidays and the request to delay any further  evaluation. Patient is very competent to followup with her regular treating physicians and knows to come to the hospital for recurrent symptoms suggestive of ongoing bleeding. Her discharge hemoglobin was stable at 9.1 mg/dL. Active Problems:   DIABETES MELLITUS II, UNCOMPLICATED The patient was maintained on insulin therapy. She can safely resume her usual medications at discharge.  HYPERTENSION, BENIGN SYSTEMIC Patient's blood pressure were stable and she can safely resume her antihypertensive therapy at discharge.  HYPERLIPIDEMIA No active evaluation done during this hospital stay.  Hypokalemia The patient was repleted with 40 mEq of oral potassium prior to discharge.   Recommendations for hospital follow-up: Followup with gastroenterology in 2-3 weeks.  Diet: Carbohydrate modified  Activity: Increase activity slowly, no restrictions  Condition at Discharge: Stable  Time spent on Discharge: 35 minutes  Signed: Dr. Margreta Journey Rama Pager [phone] 02/01/2011, 10:51 AM

## 2011-02-01 NOTE — H&P (Signed)
PCP:   Philis Fendt, MD, MD   Chief Complaint:  Weakness  HPI: This is a 70 year old female with known history of telangiectasias/gastric AVMs, she states that she is normally very active but this has been feeling weak and tired the last 2 days. Yesterday and today her stool is also been dark-colored, no overt bleeding. She came to the ER, her hemoglobin was 8.1 which is a bit lower than her baseline and she has guaiac positive melanotic stools. The hospitalist service was called with a request to admit. The patient usually gets iron transfusion at the [facility]. She states this has significantly decreased the frequency of transfusion. She was last transfused approximately 4 months ago.  Review of Systems: Positives bolded  The patient denies anorexia, fever, weight loss,, vision loss, decreased hearing, hoarseness, chest pain, syncope, dyspnea on exertion, peripheral edema, balance deficits, hemoptysis, abdominal pain, melena, hematochezia, severe indigestion/heartburn, hematuria, incontinence, genital sores, muscle weakness, suspicious skin lesions, transient blindness, difficulty walking, depression, unusual weight change, abnormal bleeding, enlarged lymph nodes, angioedema, and breast masses.  Past Medical History: Past Medical History  Diagnosis Date  . Anemia   . S/P endoscopy     with laser treatment  08/19/2006  . History of epistaxis   . Diabetes mellitus     insulin requiring.  Marland Kitchen Hypertension   . Hyperlipidemia   . Telangiectasia     Gastric   . Lichen planus     Both lower extremities   Past Surgical History  Procedure Date  . Nose surgery     Medications: Prior to Admission medications   Medication Sig Start Date End Date Taking? Authorizing Provider  enalapril (VASOTEC) 5 MG tablet Take 5 mg by mouth daily.     Yes Historical Provider, MD  hydrALAZINE (APRESOLINE) 10 MG tablet Take 10 mg by mouth as needed.     Yes Historical Provider, MD  insulin glargine  (LANTUS) 100 UNIT/ML injection Inject 46 Units into the skin daily.     Yes Historical Provider, MD  metFORMIN (GLUCOPHAGE) 1000 MG tablet Take 1,000 mg by mouth daily.     Yes Historical Provider, MD  pantoprazole (PROTONIX) 40 MG tablet Take 40 mg by mouth daily.     Yes Historical Provider, MD  Vitamin D, Ergocalciferol, (DRISDOL) 50000 UNITS CAPS Take 50,000 Units by mouth every 7 (seven) days.     Yes Historical Provider, MD  sodium chloride (OCEAN) 0.65 % nasal spray Place 1 spray into the nose daily.      Historical Provider, MD  vitamin B-12 (CYANOCOBALAMIN) 1000 MCG tablet Take 1,000 mcg by mouth daily.      Historical Provider, MD    Allergies:   Allergies  Allergen Reactions  . Aspirin     Social History:  reports that she has never smoked. She does not have any smokeless tobacco history on file. She reports that she does not drink alcohol or use illicit drugs.  Family History: Family History  Problem Relation Age of Onset  . Breast cancer      Physical Exam: Filed Vitals:   01/31/11 2130 01/31/11 2200 01/31/11 2230 02/01/11 0033  BP: 140/64 118/52 130/66 161/65  Pulse: 71 71 71 80  Temp:    98.9 F (37.2 C)  TempSrc:    Oral  Resp: 15 17    SpO2: 98% 97% 99% 98%    General:  Alert and oriented times three, well developed and nourished, no acute distress Eyes: PERRLA, pale conjunctiva,  no scleral icterus ENT: Moist oral mucosa, neck supple, no thyromegaly Lungs: clear to ascultation, no wheeze, no crackles, no use of accessory muscles Cardiovascular: regular rate and rhythm, no regurgitation, no gallops, no murmurs. No carotid bruits, no JVD Abdomen: soft, positive BS, non-tender, non-distended, no organomegaly, not an acute abdomen GU: not examined Neuro: CN II - XII grossly intact, sensation intact Musculoskeletal: strength 5/5 all extremities, no clubbing, cyanosis or edema Skin: no rash, no subcutaneous crepitation, no decubitus Psych: appropriate  patient   Labs on Admission:   [facility] 01/31/11 2007  NA 139  K 3.8  CL 105  CO2 28  GLUCOSE 226*  BUN 17  CREATININE 0.76  CALCIUM 9.5  MG --  PHOS --   No results found for this basename: AST:2,ALT:2,ALKPHOS:2,BILITOT:2,PROT:2,ALBUMIN:2 in the last 72 hours No results found for this basename: LIPASE:2,AMYLASE:2 in the last 72 hours  Basename 01/31/11 2007  WBC 6.9  NEUTROABS --  HGB 8.1*  HCT 24.5*  MCV 87.8  PLT 227   No results found for this basename: CKTOTAL:3,CKMB:3,CKMBINDEX:3,TROPONINI:3 in the last 72 hours No results found for this basename: TSH,T4TOTAL,FREET3,T3FREE,THYROIDAB in the last 72 hours No results found for this basename: VITAMINB12:2,FOLATE:2,FERRITIN:2,TIBC:2,IRON:2,RETICCTPCT:2 in the last 72 hours  Radiological Exams on Admission: No results found.  Assessment/Plan Present on Admission:  .Anemia Telangiectasias -gastric AVM  Melena  Admit to MedSurg N.p.o., IV fluids  Transfuse 2 units packed red blood cells  No overt bleeding, GI consult in a.m.  Anemia panel  Protonix I.V.  .DIABETES MELLITUS II, UNCOMPLICATED .HYPERLIPIDEMIA .HYPERTENSION, BENIGN SYSTEMIC Stable, resume home meds Low dose of Lantus given as patient is n.p.o. Sliding scale insulin  SCDs Full code Team 5/Dr. Clare Gandy, Deserae Jennings 02/01/2011, 12:56 AM

## 2011-02-02 LAB — TYPE AND SCREEN
ABO/RH(D): A POS
Antibody Screen: NEGATIVE
Unit division: 0
Unit division: 0

## 2011-02-02 LAB — GLUCOSE, CAPILLARY

## 2011-02-02 NOTE — Progress Notes (Signed)
UR CHART REVIEWED; B Aanchal Cope RN, BSN, MHA 

## 2011-02-09 ENCOUNTER — Encounter: Payer: Self-pay | Admitting: Nurse Practitioner

## 2011-02-13 ENCOUNTER — Other Ambulatory Visit: Payer: Self-pay | Admitting: Hematology and Oncology

## 2011-02-13 ENCOUNTER — Other Ambulatory Visit (HOSPITAL_BASED_OUTPATIENT_CLINIC_OR_DEPARTMENT_OTHER): Payer: Medicare Other | Admitting: Lab

## 2011-02-13 ENCOUNTER — Telehealth: Payer: Self-pay | Admitting: *Deleted

## 2011-02-13 DIAGNOSIS — D509 Iron deficiency anemia, unspecified: Secondary | ICD-10-CM

## 2011-02-13 LAB — IRON AND TIBC
%SAT: 6 % — ABNORMAL LOW (ref 20–55)
TIBC: 303 ug/dL (ref 250–470)
UIBC: 285 ug/dL (ref 125–400)

## 2011-02-13 LAB — COMPREHENSIVE METABOLIC PANEL
ALT: 40 U/L — ABNORMAL HIGH (ref 0–35)
AST: 45 U/L — ABNORMAL HIGH (ref 0–37)
Albumin: 3.6 g/dL (ref 3.5–5.2)
Alkaline Phosphatase: 103 U/L (ref 39–117)
BUN: 13 mg/dL (ref 6–23)
Creatinine, Ser: 0.86 mg/dL (ref 0.50–1.10)
Potassium: 3.9 mEq/L (ref 3.5–5.3)

## 2011-02-13 LAB — CBC WITH DIFFERENTIAL/PLATELET
Basophils Absolute: 0 10*3/uL (ref 0.0–0.1)
EOS%: 6 % (ref 0.0–7.0)
HCT: 23.5 % — ABNORMAL LOW (ref 34.8–46.6)
HGB: 7.8 g/dL — ABNORMAL LOW (ref 11.6–15.9)
LYMPH%: 11.4 % — ABNORMAL LOW (ref 14.0–49.7)
MCH: 28.1 pg (ref 25.1–34.0)
MCHC: 33 g/dL (ref 31.5–36.0)
MCV: 85.4 fL (ref 79.5–101.0)
MONO%: 8.5 % (ref 0.0–14.0)
NEUT%: 74 % (ref 38.4–76.8)

## 2011-02-13 LAB — FERRITIN: Ferritin: 21 ng/mL (ref 10–291)

## 2011-02-13 NOTE — Telephone Encounter (Signed)
Hgb   7.8  Today.   Dr.   Jamse Arn reviewed lab results.   Spoke with pt and instructed pt to f/u with her primary for blood transfusion as needed.   Pt voiced understanding.

## 2011-02-18 ENCOUNTER — Ambulatory Visit: Payer: Medicare Other | Admitting: Hematology and Oncology

## 2011-02-18 ENCOUNTER — Ambulatory Visit (HOSPITAL_BASED_OUTPATIENT_CLINIC_OR_DEPARTMENT_OTHER): Payer: Medicare Other | Admitting: Physician Assistant

## 2011-02-18 ENCOUNTER — Telehealth: Payer: Self-pay | Admitting: Hematology and Oncology

## 2011-02-18 VITALS — BP 144/51 | HR 83 | Temp 98.0°F | Ht 63.0 in | Wt 158.5 lb

## 2011-02-18 DIAGNOSIS — D509 Iron deficiency anemia, unspecified: Secondary | ICD-10-CM

## 2011-02-18 DIAGNOSIS — D649 Anemia, unspecified: Secondary | ICD-10-CM

## 2011-02-18 DIAGNOSIS — I780 Hereditary hemorrhagic telangiectasia: Secondary | ICD-10-CM

## 2011-02-18 NOTE — Progress Notes (Signed)
CC: Nolene Ebbs, M.D.  Ricard Dillon, M.D.  Leonides Sake. Lucia Gaskins, M.D.   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  INTERIM HISTORY:  [name] Reports since the last clinic visit on 9/07/ 2012 Currently, she reports some fatigue but still manages to complete ADLs.  She has remained  active despite her anemia. She does note some dyspnea with exertion but no fevers or cough.Patient's last transfusion with 2 units of blood was on 12/23 .  She has normal appetite, has had no issues with nausea, vomiting, constipation, or diarrhea.  No known rectal bleeding.Of note, she has an appointment with Dr. Cristina Gong on 02/19/2011 for a GI evaluation.   No dysuria, frequency, or hematuria.  No alteration in sensation or balance or swelling of extremities.   MEDICATIONS: reviewed and Updated.  PMH, FH and SH: Unchanged.  Review of systems: As above and 10 point review of systems - negative.   Vital signs in last 24 hours: 144/51, P83, R 20, T 98, weight 158.5 (was 156 on 12/23)  PHYSICAL EXAM General:  This is a well-developed, well-nourished black female in no acute distress.  HEENT:  Sclerae nonicteric.  There is no thrush, mucositis.  Skin:  No rashes or lesions.  Lymph:  No cervical, supraclavicular, axillary, or inguinal lymphadenopathy.  Cardiac:  Regular rate and rhythm without murmurs or gallops.  Peripheral pulses are 2+.  Chest:  Lungs clear to auscultation.  Abdomen:  Positive bowel sounds, soft, nontender, and nondistended.  No organomegaly.  Extremities:  No edema or cyanosis.  Neuro:  Alert and oriented x3.  Strength, sensation, and coordination all grossly intact.   LABS:   CBC   Lab 02/13/11 1355  WBC 5.4  HGB 7.8*  HCT 23.5*  PLT 248  MCV 85.4  MCH 28.1  MCHC 33.0  RDW 18.2*  LYMPHSABS 0.6*  MONOABS 0.5  EOSABS 0.3  BASOSABS 0.0  BANDABS --     Iron Studies:   Iron :18  TIBC :303 %sat: 6 Ferritin: 21 B12: 446  CMP    Lab 02/13/11 1355  NA 144  K 3.9  CL 110  CO2 25  GLUCOSE 75  BUN 13  CREATININE 0.86  CALCIUM 9.2  MG --  AST 45*  ALT 40*  ALKPHOS 103  BILITOT 0.4    ASSESSMENT and PLAN:  1. 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 The patient with low iron as well as low ferritin level.Intravenous iron in the form of INFeD  Will be administered within the next week. 2. The patient will be scheduled for followup visit with Dr. Jamse Arn in 3 months' time.  A few days before this we will reassess CBC with diff, BMET, ferritin, and iron IBC.  She is instructed to call in the interim if any questions or problems. The patient is also to see her gastroenterologist for GI workup, Dr. Cristina Gong, for further evaluation of anemia.  More than 50 percent of the encounter has been spent on patient counseling  [name] E, MD 02/18/2011   I have seen and examined the patient and agree with above.  Nira Retort., M.D.

## 2011-02-18 NOTE — Telephone Encounter (Signed)
gve the pt her jan,march 2013 appt calendar

## 2011-02-24 ENCOUNTER — Encounter (HOSPITAL_COMMUNITY): Payer: Self-pay | Admitting: Pharmacy Technician

## 2011-02-24 ENCOUNTER — Encounter (HOSPITAL_COMMUNITY): Payer: Self-pay | Admitting: *Deleted

## 2011-02-26 ENCOUNTER — Encounter (HOSPITAL_COMMUNITY)
Admission: RE | Disposition: A | Discharge status: Home / Self Care | Payer: Self-pay | Source: Ambulatory Visit | Attending: Gastroenterology

## 2011-02-26 ENCOUNTER — Ambulatory Visit (HOSPITAL_COMMUNITY)
Admission: RE | Admit: 2011-02-26 | Discharge: 2011-02-26 | Disposition: A | Discharge status: Home / Self Care | Payer: Medicare Other | Source: Ambulatory Visit | Attending: Gastroenterology | Admitting: Gastroenterology

## 2011-02-26 ENCOUNTER — Encounter (HOSPITAL_COMMUNITY): Payer: Self-pay

## 2011-02-26 DIAGNOSIS — D649 Anemia, unspecified: Secondary | ICD-10-CM | POA: Insufficient documentation

## 2011-02-26 DIAGNOSIS — K31811 Angiodysplasia of stomach and duodenum with bleeding: Secondary | ICD-10-CM | POA: Insufficient documentation

## 2011-02-26 DIAGNOSIS — I10 Essential (primary) hypertension: Secondary | ICD-10-CM | POA: Insufficient documentation

## 2011-02-26 DIAGNOSIS — I789 Disease of capillaries, unspecified: Secondary | ICD-10-CM | POA: Insufficient documentation

## 2011-02-26 DIAGNOSIS — E119 Type 2 diabetes mellitus without complications: Secondary | ICD-10-CM | POA: Insufficient documentation

## 2011-02-26 DIAGNOSIS — E785 Hyperlipidemia, unspecified: Secondary | ICD-10-CM | POA: Insufficient documentation

## 2011-02-26 HISTORY — PX: ESOPHAGOGASTRODUODENOSCOPY: SHX5428

## 2011-02-26 HISTORY — DX: Gastro-esophageal reflux disease without esophagitis: K21.9

## 2011-02-26 HISTORY — PX: SAVORY DILATION: SHX5439

## 2011-02-26 HISTORY — DX: Cardiac murmur, unspecified: R01.1

## 2011-02-26 SURGERY — EGD, WITH DILATION USING SAVARY-GILLIARD DILATOR OVER GUIDEWIRE
Anesthesia: Moderate Sedation

## 2011-02-26 MED ORDER — MIDAZOLAM HCL 10 MG/2ML IJ SOLN
INTRAMUSCULAR | Status: AC
  Filled 2011-02-26: qty 2

## 2011-02-26 MED ORDER — BUTAMBEN-TETRACAINE-BENZOCAINE 2-2-14 % EX AERO
INHALATION_SPRAY | CUTANEOUS | Status: DC | PRN
  Administered 2011-02-26: 2 via TOPICAL

## 2011-02-26 MED ORDER — FENTANYL CITRATE 0.05 MG/ML IJ SOLN
INTRAMUSCULAR | Status: AC
  Filled 2011-02-26: qty 2

## 2011-02-26 MED ORDER — FENTANYL NICU IV SYRINGE 50 MCG/ML
INJECTION | INTRAMUSCULAR | Status: DC | PRN
  Administered 2011-02-26: 25 ug via INTRAVENOUS
  Administered 2011-02-26 (×2): 12.5 ug via INTRAVENOUS

## 2011-02-26 MED ORDER — MIDAZOLAM HCL 10 MG/2ML IJ SOLN
INTRAMUSCULAR | Status: DC | PRN
  Administered 2011-02-26: 1 mg via INTRAVENOUS
  Administered 2011-02-26 (×2): 2 mg via INTRAVENOUS

## 2011-02-26 MED ORDER — SODIUM CHLORIDE 0.9 % IV SOLN
Freq: Once | INTRAVENOUS | Status: AC
  Administered 2011-02-26: 500 mL via INTRAVENOUS

## 2011-02-26 MED ORDER — DIPHENHYDRAMINE HCL 50 MG/ML IJ SOLN
INTRAMUSCULAR | Status: AC
  Filled 2011-02-26: qty 1

## 2011-02-26 NOTE — Op Note (Signed)
[facility] [place], [place]  [id number]  ENDOSCOPY PROCEDURE REPORT  PATIENT:  [name], [name]  MR#:  [id number] BIRTHDATE:  10-Jul-1940, 8 yrs. old  GENDER:  female  ENDOSCOPIST:  Teena Irani Referred by:  PROCEDURE DATE:  02/26/2011 PROCEDURE: ASA CLASS: INDICATIONS: GI bleed in a patient with hereditary telangiectasia   MEDICATIONS: Fentanyl 50 mcg, Versed 4 mg TOPICAL ANESTHETIC:  DESCRIPTION OF PROCEDURE:   After the risks benefits and alternatives of the procedure were thoroughly explained, informed consent was obtained.  The  endoscope was introduced through the mouth and advanced to the , without limitations.  The instrument was slowly withdrawn as the mucosa was fully examined. <<PROCEDUREIMAGES>>  FINDINGS: Several AVMs in the proximal stomach one of which was bleeding after the scope was advanced. The argon plasma coagulator was used to fulgurate this lesion with cessation of bleeding. Several other AVMs were also fulgurated mainly in the body of the stomach. No AVMs were seen in the duodenum.  COMPLICATIONS: None  ENDOSCOPIC IMPRESSION: AVMs in the stomach  RECOMMENDATIONS: We'll arrange transfusion for hemoglobin of 7.4 and monitor stools and hemoglobin  REPEAT EXAM:  ______________________________ Teena Irani  CC:  n. eSIGNED:   Teena Irani at 02/26/2011 11:01 AM  Alysia Penna, [id number]

## 2011-02-26 NOTE — Brief Op Note (Signed)
02/26/2011  11:02 AM  PATIENT:  [name]  72 y.o. female  PRE-OPERATIVE DIAGNOSIS:  blood in stool  POST-OPERATIVE DIAGNOSIS:  AVM's  PROCEDURE:  Procedure(s): SAVORY DILATION ESOPHAGOGASTRODUODENOSCOPY (EGD)  SURGEON:  Surgeon(s): [name], MD  Please see formal operative note. Scattered AVMs in the stomach addressed with the argon plasma coagulator.

## 2011-02-27 ENCOUNTER — Encounter (HOSPITAL_COMMUNITY): Payer: Self-pay

## 2011-03-02 ENCOUNTER — Ambulatory Visit (HOSPITAL_BASED_OUTPATIENT_CLINIC_OR_DEPARTMENT_OTHER): Payer: Medicare Other

## 2011-03-02 ENCOUNTER — Encounter (HOSPITAL_COMMUNITY): Payer: Self-pay | Admitting: Gastroenterology

## 2011-03-02 VITALS — BP 151/78 | HR 92 | Temp 98.7°F

## 2011-03-02 DIAGNOSIS — D509 Iron deficiency anemia, unspecified: Secondary | ICD-10-CM

## 2011-03-02 DIAGNOSIS — D649 Anemia, unspecified: Secondary | ICD-10-CM

## 2011-03-02 MED ORDER — IRON DEXTRAN 50 MG/ML IJ SOLN
1400.0000 mg | Freq: Once | INTRAMUSCULAR | Status: AC
  Administered 2011-03-02: 1400 mg via INTRAVENOUS
  Filled 2011-03-02: qty 28

## 2011-03-02 MED ORDER — DIPHENHYDRAMINE HCL 25 MG PO TABS
25.0000 mg | ORAL_TABLET | Freq: Once | ORAL | Status: AC
  Administered 2011-03-02: 25 mg via ORAL
  Filled 2011-03-02: qty 1

## 2011-03-02 MED ORDER — SODIUM CHLORIDE 0.9 % IV SOLN
Freq: Once | INTRAVENOUS | Status: AC
  Administered 2011-03-02: 08:00:00 via INTRAVENOUS

## 2011-03-02 MED ORDER — SODIUM CHLORIDE 0.9 % IV SOLN
50.0000 mg | Freq: Once | INTRAVENOUS | Status: AC
  Administered 2011-03-02: 50 mg via INTRAVENOUS
  Filled 2011-03-02: qty 1

## 2011-03-02 MED ORDER — ACETAMINOPHEN 325 MG PO TABS
650.0000 mg | ORAL_TABLET | Freq: Once | ORAL | Status: AC
  Administered 2011-03-02: 650 mg via ORAL

## 2011-03-02 NOTE — Patient Instructions (Signed)
1400 Pt ambulatory upon discharge and verbalized understanding of next appt.  States she had appt calendar at home.

## 2011-03-03 ENCOUNTER — Ambulatory Visit (HOSPITAL_COMMUNITY)
Admission: RE | Admit: 2011-03-03 | Discharge: 2011-03-03 | Disposition: A | Discharge status: Home / Self Care | Payer: Medicare Other | Source: Ambulatory Visit | Attending: Gastroenterology | Admitting: Gastroenterology

## 2011-03-03 DIAGNOSIS — D649 Anemia, unspecified: Secondary | ICD-10-CM | POA: Insufficient documentation

## 2011-03-03 LAB — TYPE AND SCREEN
ABO/RH(D): A POS
Unit division: 0

## 2011-03-17 NOTE — H&P (Signed)
[facility] Admission History & Physical  Chief Complaint: anemia and heme positive stoolHPI: [name] is an 71 y.o. female.  Presenting for EGD for the above indications  Past Medical History  Diagnosis Date  . Anemia   . S/P endoscopy     with laser treatment  08/19/2006  . History of epistaxis   . Diabetes mellitus     insulin requiring.  Marland Kitchen Hypertension   . Hyperlipidemia   . Telangiectasia     Gastric   . Lichen planus     Both lower extremities  . GERD (gastroesophageal reflux disease)   . Heart murmur     Past Surgical History  Procedure Date  . Nose surgery   . Savory dilation 02/26/2011    Procedure: SAVORY DILATION;  Surgeon: [name], MD;  Location: [name];  Service: Endoscopy;  Laterality: N/A;  c-arm needed  . Esophagogastroduodenoscopy 02/26/2011    Procedure: ESOPHAGOGASTRODUODENOSCOPY (EGD);  Surgeon: [name], MD;  Location: [name];  Service: Endoscopy;  Laterality: N/A;    Medications Prior to Admission  Medication Dose Route Frequency Provider Last Rate Last Dose  . 0.9 %  sodium chloride infusion   Intravenous Once [name], MD 20 mL/hr at 02/26/11 0953 500 mL at 02/26/11 0953  . DISCONTD: butamben-tetracaine-benzocaine (CETACAINE) spray    PRN [name], MD   2 spray at 02/26/11 1038  . DISCONTD: fentaNYL NICU IV Syringe 50 mcg/mL    PRN [name], MD   12.5 mcg at 02/26/11 1049  . DISCONTD: midazolam (VERSED) injection    PRN [name], MD   1 mg at 02/26/11 1048   Medications Prior to Admission  Medication Sig Dispense Refill  . enalapril (VASOTEC) 5 MG tablet Take 5 mg by mouth at bedtime.       . hydrALAZINE (APRESOLINE) 10 MG tablet Take 10 mg by mouth as needed. For itching      . insulin glargine (LANTUS) 100 UNIT/ML injection Inject 46 Units into the skin at bedtime.       . metFORMIN (GLUCOPHAGE) 1000 MG tablet Take 1,000 mg by mouth daily with breakfast.       . pantoprazole (PROTONIX) 40 MG  tablet Take 40 mg by mouth at bedtime.       . sodium chloride (OCEAN) 0.65 % nasal spray Place 1 spray into the nose as needed. For stuffy nose      . vitamin B-12 (CYANOCOBALAMIN) 1000 MCG tablet Take 1,000 mcg by mouth daily with breakfast.       . Vitamin D, Ergocalciferol, (DRISDOL) 50000 UNITS CAPS Take 50,000 Units by mouth every 7 (seven) days. On tuesday        Allergies:  Allergies  Allergen Reactions  . Aspirin Nausea Only    Family History  Problem Relation Age of Onset  . Breast cancer    . Malignant hyperthermia Neg Hx     Social History:  reports that she has never smoked. She does not have any smokeless tobacco history on file. She reports that she does not drink alcohol or use illicit drugs.  Negative except as above   Blood pressure 120/58, temperature 98.3 F (36.8 C), resp. rate 17, SpO2 100.00%. Head: Normocephalic, without obvious abnormality, atraumatic Neck: no adenopathy, no carotid bruit, no JVD, supple, symmetrical, trachea midline and thyroid not enlarged, symmetric, no tenderness/mass/nodules Resp: clear to auscultation bilaterally Cardio: regular rate and rhythm, S1, S2 normal, no  murmur, click, rub or gallop GI: as abdomen soft non-tender with normoactive bowel sounds Extremities: extremities normal, atraumatic, no cyanosis or edema  No results found for this or any previous visit (from the past 48 hour(s)). No results found.  Assessment: Anemia and heme positive stools Plan: Proceed with EGD.  Zoya Sprecher C 03/17/2011, 8:52 AM

## 2011-03-25 ENCOUNTER — Other Ambulatory Visit: Payer: Self-pay | Admitting: Internal Medicine

## 2011-03-25 DIAGNOSIS — Z1231 Encounter for screening mammogram for malignant neoplasm of breast: Secondary | ICD-10-CM

## 2011-04-03 ENCOUNTER — Ambulatory Visit
Admission: RE | Admit: 2011-04-03 | Discharge: 2011-04-03 | Disposition: A | Discharge status: Home / Self Care | Payer: Medicare Other | Source: Ambulatory Visit | Attending: Internal Medicine | Admitting: Internal Medicine

## 2011-04-03 DIAGNOSIS — Z1231 Encounter for screening mammogram for malignant neoplasm of breast: Secondary | ICD-10-CM

## 2011-04-07 ENCOUNTER — Other Ambulatory Visit: Payer: Self-pay | Admitting: Internal Medicine

## 2011-04-07 DIAGNOSIS — R928 Other abnormal and inconclusive findings on diagnostic imaging of breast: Secondary | ICD-10-CM

## 2011-04-16 ENCOUNTER — Ambulatory Visit
Admission: RE | Admit: 2011-04-16 | Discharge: 2011-04-16 | Disposition: A | Discharge status: Home / Self Care | Payer: Medicare Other | Source: Ambulatory Visit | Attending: Internal Medicine | Admitting: Internal Medicine

## 2011-04-16 DIAGNOSIS — R928 Other abnormal and inconclusive findings on diagnostic imaging of breast: Secondary | ICD-10-CM

## 2011-05-04 ENCOUNTER — Other Ambulatory Visit (HOSPITAL_BASED_OUTPATIENT_CLINIC_OR_DEPARTMENT_OTHER): Payer: Medicare Other | Admitting: Lab

## 2011-05-04 DIAGNOSIS — D508 Other iron deficiency anemias: Secondary | ICD-10-CM

## 2011-05-04 DIAGNOSIS — D649 Anemia, unspecified: Secondary | ICD-10-CM

## 2011-05-04 LAB — CBC WITH DIFFERENTIAL/PLATELET
BASO%: 0.2 % (ref 0.0–2.0)
EOS%: 10.5 % — ABNORMAL HIGH (ref 0.0–7.0)
HCT: 24.7 % — ABNORMAL LOW (ref 34.8–46.6)
MCH: 27.9 pg (ref 25.1–34.0)
MCHC: 33.2 g/dL (ref 31.5–36.0)
MONO#: 0.6 10*3/uL (ref 0.1–0.9)
RBC: 2.95 10*6/uL — ABNORMAL LOW (ref 3.70–5.45)
RDW: 17.1 % — ABNORMAL HIGH (ref 11.2–14.5)
WBC: 5.2 10*3/uL (ref 3.9–10.3)
lymph#: 0.6 10*3/uL — ABNORMAL LOW (ref 0.9–3.3)

## 2011-05-04 LAB — BASIC METABOLIC PANEL
CO2: 27 mEq/L (ref 19–32)
Chloride: 107 mEq/L (ref 96–112)
Potassium: 4 mEq/L (ref 3.5–5.3)
Sodium: 140 mEq/L (ref 135–145)

## 2011-05-06 ENCOUNTER — Ambulatory Visit (HOSPITAL_BASED_OUTPATIENT_CLINIC_OR_DEPARTMENT_OTHER): Payer: Medicare Other

## 2011-05-06 ENCOUNTER — Encounter: Payer: Self-pay | Admitting: Hematology and Oncology

## 2011-05-06 ENCOUNTER — Telehealth: Payer: Self-pay | Admitting: Hematology and Oncology

## 2011-05-06 ENCOUNTER — Ambulatory Visit (HOSPITAL_BASED_OUTPATIENT_CLINIC_OR_DEPARTMENT_OTHER): Payer: Medicare Other | Admitting: Hematology and Oncology

## 2011-05-06 VITALS — BP 152/64 | HR 76 | Temp 98.8°F

## 2011-05-06 VITALS — BP 149/54 | HR 84 | Temp 96.9°F | Ht 63.0 in | Wt 161.9 lb

## 2011-05-06 DIAGNOSIS — D539 Nutritional anemia, unspecified: Secondary | ICD-10-CM

## 2011-05-06 DIAGNOSIS — D509 Iron deficiency anemia, unspecified: Secondary | ICD-10-CM

## 2011-05-06 MED ORDER — SODIUM CHLORIDE 0.9 % IV SOLN
Freq: Once | INTRAVENOUS | Status: AC
  Administered 2011-05-06: 100 mL via INTRAVENOUS

## 2011-05-06 MED ORDER — SODIUM CHLORIDE 0.9 % IV SOLN
1020.0000 mg | Freq: Once | INTRAVENOUS | Status: AC
  Administered 2011-05-06: 1020 mg via INTRAVENOUS
  Filled 2011-05-06: qty 34

## 2011-05-06 MED ORDER — SODIUM CHLORIDE 0.9 % IJ SOLN
3.0000 mL | Freq: Once | INTRAMUSCULAR | Status: AC | PRN
  Filled 2011-05-06: qty 10

## 2011-05-06 NOTE — Progress Notes (Signed)
CC:   [name], M.D. [name], M.D. [name]. [name], M.D.  IDENTIFYING STATEMENT:  The patient is a 71 year old woman with anemia secondary to Osler-Weber-Rendu who presents for followup.  INTERVAL HISTORY:  [name] reports minimal epistaxis especially from her left nostril.  She notes this is not as it has previously been.  She has good energy levels.  She denies rectal bleeding.  She is not short of breath.  She last received IV iron in form of Infed on 03/02/2011.  MEDICATIONS:  Reviewed and updated.  PAST MEDICAL HISTORY/FAMILY HISTORY/SOCIAL HISTORY:  Unchanged.  REVIEW OF SYSTEMS:  Besides epistaxis, a 10-point review of systems were negative.  PHYSICAL EXAM:  General: Patient is alert and oriented x3.  Vitals: Pulse 84, blood pressure 149/64, temperature 96.9, respirations 20, weight 161.9 pounds.  HEENT:  Head is atraumatic, normocephalic. Sclerae anicteric.  Mouth moist.  Chest:  Clear.  CVS: Unremarkable. Abdomen:  Soft, nontender.  Bowel sounds present.  Extremities: No calf tenderness.  CNS: Nonfocal.  LAB DATA:  On 05/04/2011 white cell count 5.2, hemoglobin 8.2, hematocrit 24.7, platelets 222.  Sodium 140, potassium 4, chloride 107, CO2 27, BUN 16, creatinine 0.7, glucose 213. Calcium 8.9.  Ferritin 11 (21).  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  Her iron level has been managed with IV iron in the past.  She was administered Infed on 03/02/2011.  Her ferritin levels remained low.  She is to receive Feraheme Today. She follows up in 3 months' time.    ______________________________ Nira Retort, M.D. LIO/MEDQ  D:  05/06/2011  T:  05/06/2011  Job:  [id number]

## 2011-05-06 NOTE — Telephone Encounter (Signed)
gv pt appt schedule for June.  °

## 2011-05-06 NOTE — Progress Notes (Signed)
This office note has been dictated.

## 2011-05-06 NOTE — Patient Instructions (Signed)
Patient to follow up as instructed.   Current Outpatient Prescriptions  Medication Sig Dispense Refill  . enalapril (VASOTEC) 5 MG tablet Take 5 mg by mouth at bedtime.       . hydrALAZINE (APRESOLINE) 10 MG tablet Take 10 mg by mouth as needed. For itching      . insulin glargine (LANTUS) 100 UNIT/ML injection Inject 46 Units into the skin at bedtime.       . metFORMIN (GLUCOPHAGE) 1000 MG tablet Take 1,000 mg by mouth daily with breakfast.       . pantoprazole (PROTONIX) 40 MG tablet Take 40 mg by mouth at bedtime.       . sodium chloride (OCEAN) 0.65 % nasal spray Place 1 spray into the nose as needed. For stuffy nose      . triamcinolone ointment (KENALOG) 0.1 % Apply 1 application topically 3 (three) times a week.      . vitamin B-12 (CYANOCOBALAMIN) 1000 MCG tablet Take 1,000 mcg by mouth daily with breakfast.       . Vitamin D, Ergocalciferol, (DRISDOL) 50000 UNITS CAPS Take 50,000 Units by mouth every 7 (seven) days. On tuesday            March 2013  Sunday Monday Tuesday Wednesday Thursday Friday Saturday                  1   2    3   4   5   6   7    MM GI BCG DIAG 10 MIN  10:50 AM  (10 min.)  Gi-Bcg Mm General Dg Mammo Room  [facility]  IMAGING 8   9    10   11   12   13   14   15   16    17   18   19   20   21   22   23    24   25    LAB MO    10:00 AM  (15 min.)  Maree Krabbe  [facility] MEDICAL ONCOLOGY 26   27   EST PT 30  10:00 AM  (30 min.)  Nira Retort, MD  [place] 28   29   30     31

## 2011-05-06 NOTE — Patient Instructions (Signed)
[facility] Discharge Instructions for Patients Receiving Iron Today you received the following agents Feraheme    If you develop nausea and vomiting that is not controlled by your nausea medication, call the clinic. If it is after clinic hours your family physician or the after hours number for the clinic or go to the Emergency Department.   BELOW ARE SYMPTOMS THAT SHOULD BE REPORTED IMMEDIATELY:  *FEVER GREATER THAN 100.5 F  *CHILLS WITH OR WITHOUT FEVER  NAUSEA AND VOMITING THAT IS NOT CONTROLLED WITH YOUR NAUSEA MEDICATION  *UNUSUAL SHORTNESS OF BREATH  *UNUSUAL BRUISING OR BLEEDING  TENDERNESS IN MOUTH AND THROAT WITH OR WITHOUT PRESENCE OF ULCERS  *URINARY PROBLEMS  *BOWEL PROBLEMS  UNUSUAL RASH Items with * indicate a potential emergency and should be followed up as soon as possible.   Feel free to call the clinic you have any questions or concerns. The clinic phone number is [phone].   I have been informed and understand all the instructions given to me. I know to contact the clinic, my physician, or go to the Emergency Department if any problems should occur. I do not have any questions at this time, but understand that I may call the clinic during office hours   should I have any questions or need assistance in obtaining follow up care.    __________________________________________  _____________  __________ Signature of Patient or Authorized Representative            Date                   Time    __________________________________________ Nurse's Signature

## 2011-07-08 ENCOUNTER — Encounter (HOSPITAL_COMMUNITY): Payer: Self-pay

## 2011-07-08 ENCOUNTER — Emergency Department (INDEPENDENT_AMBULATORY_CARE_PROVIDER_SITE_OTHER)
Admission: EM | Admit: 2011-07-08 | Discharge: 2011-07-08 | Disposition: A | Discharge status: Home Health | Payer: Medicare Other | Source: Home / Self Care | Attending: Family Medicine | Admitting: Family Medicine

## 2011-07-08 DIAGNOSIS — R5383 Other fatigue: Secondary | ICD-10-CM

## 2011-07-08 DIAGNOSIS — R5381 Other malaise: Secondary | ICD-10-CM

## 2011-07-08 DIAGNOSIS — D509 Iron deficiency anemia, unspecified: Secondary | ICD-10-CM

## 2011-07-08 LAB — POCT URINALYSIS DIP (DEVICE)
Glucose, UA: NEGATIVE mg/dL
Hgb urine dipstick: NEGATIVE
Nitrite: NEGATIVE
Urobilinogen, UA: 0.2 mg/dL (ref 0.0–1.0)
pH: 6 (ref 5.0–8.0)

## 2011-07-08 LAB — POCT I-STAT, CHEM 8
Calcium, Ion: 1.25 mmol/L (ref 1.12–1.32)
Glucose, Bld: 202 mg/dL — ABNORMAL HIGH (ref 70–99)
HCT: 22 % — ABNORMAL LOW (ref 36.0–46.0)
Hemoglobin: 7.5 g/dL — ABNORMAL LOW (ref 12.0–15.0)

## 2011-07-08 NOTE — Discharge Instructions (Signed)
Call your blood doctor on thurs and report that iron level is 7.5 , you may need a transfusion again.

## 2011-07-08 NOTE — ED Notes (Signed)
C/o not felt well since 5-19, c/o sleeping all the day long, not sleeping at night; denies pain, n/v/d, w/d/color good

## 2011-07-08 NOTE — ED Provider Notes (Signed)
History     CSN: [id number]  Arrival date & time 07/08/11  1647   First MD Initiated Contact with Patient 07/08/11 1652      Chief Complaint  Patient presents with  . Fatigue    (Consider location/radiation/quality/duration/timing/severity/associated sxs/prior treatment) Patient is a 71 y.o. female presenting with weakness. The history is provided by the patient.  Weakness Primary symptoms do not include headaches, loss of consciousness, altered mental status, fever, nausea or vomiting. Primary symptoms comment: tired and excessive sleeping for 10 days. The symptoms began more than 1 week ago. The symptoms are unchanged.  Additional symptoms include weakness. Additional symptoms do not include pain, lower back pain, leg pain or loss of balance. Medical issues also include diabetes.    Past Medical History  Diagnosis Date  . Anemia   . S/P endoscopy     with laser treatment  08/19/2006  . History of epistaxis   . Diabetes mellitus     insulin requiring.  Marland Kitchen Hypertension   . Hyperlipidemia   . Telangiectasia     Gastric   . Lichen planus     Both lower extremities  . GERD (gastroesophageal reflux disease)   . Heart murmur     Past Surgical History  Procedure Date  . Nose surgery   . Savory dilation 02/26/2011    Procedure: SAVORY DILATION;  Surgeon: [name], MD;  Location: [name];  Service: Endoscopy;  Laterality: N/A;  c-arm needed  . Esophagogastroduodenoscopy 02/26/2011    Procedure: ESOPHAGOGASTRODUODENOSCOPY (EGD);  Surgeon: [name], MD;  Location: [name];  Service: Endoscopy;  Laterality: N/A;    Family History  Problem Relation Age of Onset  . Breast cancer    . Malignant hyperthermia Neg Hx     History  Substance Use Topics  . Smoking status: Never Smoker   . Smokeless tobacco: Not on file  . Alcohol Use: No    OB History    Grav Para Term Preterm Abortions TAB SAB Ect Mult Living                  Review of Systems    Constitutional: Positive for activity change and fatigue. Negative for fever and appetite change.  HENT: Negative.   Respiratory: Negative.   Cardiovascular: Negative.   Gastrointestinal: Negative.  Negative for nausea and vomiting.  Genitourinary: Negative.   Neurological: Positive for weakness. Negative for loss of consciousness, headaches and loss of balance.  Psychiatric/Behavioral: Negative for altered mental status.    Allergies  Aspirin  Home Medications   Current Outpatient Rx  Name Route Sig Dispense Refill  . ENALAPRIL MALEATE 5 MG PO TABS Oral Take 5 mg by mouth at bedtime.     Marland Kitchen HYDRALAZINE HCL 10 MG PO TABS Oral Take 10 mg by mouth as needed. For itching    . INSULIN GLARGINE 100 UNIT/ML [place] SOLN Subcutaneous Inject 46 Units into the skin at bedtime.     Marland Kitchen METFORMIN HCL 1000 MG PO TABS Oral Take 1,000 mg by mouth daily with breakfast.     . PANTOPRAZOLE SODIUM 40 MG PO TBEC Oral Take 40 mg by mouth at bedtime.     Marland Kitchen SALINE NASAL SPRAY 0.65 % NA SOLN Nasal Place 1 spray into the nose as needed. For stuffy nose    . TRIAMCINOLONE ACETONIDE 0.1 % EX OINT Topical Apply 1 application topically 3 (three) times a week.    Marland Kitchen VITAMIN B-12 1000 MCG PO TABS  Oral Take 1,000 mcg by mouth daily with breakfast.     . VITAMIN D (ERGOCALCIFEROL) 50000 UNITS PO CAPS Oral Take 50,000 Units by mouth every 7 (seven) days. On tuesday      BP 155/72  Pulse 72  Temp(Src) 98.2 F (36.8 C) (Oral)  Resp 20  SpO2 98%  Physical Exam  Nursing note and vitals reviewed. Constitutional: She is oriented to person, place, and time. She appears well-developed and well-nourished.  HENT:  Head: Normocephalic.  Mouth/Throat: Oropharynx is clear and moist.  Eyes: Conjunctivae and EOM are normal. Pupils are equal, round, and reactive to light.  Neck: Normal range of motion. Neck supple.  Cardiovascular: Regular rhythm and normal heart sounds.   Pulmonary/Chest: Effort normal and breath sounds  normal.  Abdominal: Soft. Bowel sounds are normal.  Musculoskeletal: She exhibits no edema and no tenderness.  Lymphadenopathy:    She has no cervical adenopathy.  Neurological: She is alert and oriented to person, place, and time.  Skin: Skin is warm and dry.    ED Course  Procedures (including critical care time)  Labs Reviewed  POCT I-STAT, CHEM 8 - Abnormal; Notable for the following:    Glucose, Bld 202 (*)    Hemoglobin 7.5 (*)    HCT 22.0 (*)    All other components within normal limits  POCT URINALYSIS DIP (DEVICE)   No results found.   1. Fatigue   2. Iron deficiency anemia       MDM  hgb--7.5       Billy Fischer, MD 07/08/11 [phone]

## 2011-07-09 ENCOUNTER — Telehealth: Payer: Self-pay | Admitting: *Deleted

## 2011-07-09 NOTE — Telephone Encounter (Signed)
Pt called and left message wanting to talk to nurse.   Spoke with pt and was informed that pt was feeling weak, and iron level low.   Pt stated she went to urgent care and was instructed to call her primary.   Pt informed nurse that she told her primary office that pt did not want an extra bill.    Pt wanted to know if Dr. Jamse Arn could see pt sooner than appt on June 24.   Pt stated she would like to receive iron infusion sooner than 08/05/11.   Informed pt that md was not in office this afternoon.    Instructed pt to go to ER if she feels very weak, and SOB.   Pt stated she would rather wait to hear back from Dr. Jamse Arn on 07/10/11. Pt's   Phone      [phone].

## 2011-07-10 ENCOUNTER — Inpatient Hospital Stay (HOSPITAL_COMMUNITY)
Admission: EM | Admit: 2011-07-10 | Discharge: 2011-07-11 | DRG: 812 | Disposition: A | Discharge status: Home / Self Care | Payer: Medicare Other | Attending: Internal Medicine | Admitting: Internal Medicine

## 2011-07-10 ENCOUNTER — Encounter (HOSPITAL_COMMUNITY): Payer: Self-pay | Admitting: Emergency Medicine

## 2011-07-10 DIAGNOSIS — K922 Gastrointestinal hemorrhage, unspecified: Secondary | ICD-10-CM | POA: Diagnosis present

## 2011-07-10 DIAGNOSIS — E876 Hypokalemia: Secondary | ICD-10-CM | POA: Diagnosis present

## 2011-07-10 DIAGNOSIS — E1165 Type 2 diabetes mellitus with hyperglycemia: Secondary | ICD-10-CM

## 2011-07-10 DIAGNOSIS — R5383 Other fatigue: Secondary | ICD-10-CM | POA: Diagnosis present

## 2011-07-10 DIAGNOSIS — I10 Essential (primary) hypertension: Secondary | ICD-10-CM

## 2011-07-10 DIAGNOSIS — K219 Gastro-esophageal reflux disease without esophagitis: Secondary | ICD-10-CM | POA: Diagnosis present

## 2011-07-10 DIAGNOSIS — K31819 Angiodysplasia of stomach and duodenum without bleeding: Secondary | ICD-10-CM

## 2011-07-10 DIAGNOSIS — E118 Type 2 diabetes mellitus with unspecified complications: Secondary | ICD-10-CM | POA: Diagnosis present

## 2011-07-10 DIAGNOSIS — E785 Hyperlipidemia, unspecified: Secondary | ICD-10-CM | POA: Diagnosis present

## 2011-07-10 DIAGNOSIS — D649 Anemia, unspecified: Secondary | ICD-10-CM

## 2011-07-10 DIAGNOSIS — E119 Type 2 diabetes mellitus without complications: Secondary | ICD-10-CM | POA: Diagnosis present

## 2011-07-10 DIAGNOSIS — R5381 Other malaise: Secondary | ICD-10-CM

## 2011-07-10 DIAGNOSIS — I780 Hereditary hemorrhagic telangiectasia: Secondary | ICD-10-CM | POA: Diagnosis present

## 2011-07-10 LAB — URINALYSIS, ROUTINE W REFLEX MICROSCOPIC
Bilirubin Urine: NEGATIVE
Nitrite: NEGATIVE
Specific Gravity, Urine: 1.011 (ref 1.005–1.030)
Urobilinogen, UA: 0.2 mg/dL (ref 0.0–1.0)

## 2011-07-10 LAB — BASIC METABOLIC PANEL
BUN: 14 mg/dL (ref 6–23)
GFR calc Af Amer: >90 mL/min (ref >90)
GFR calc non Af Amer: 84 mL/min — ABNORMAL LOW (ref >90)
Potassium: 4 mEq/L (ref 3.5–5.1)
Sodium: 140 mEq/L (ref 135–145)

## 2011-07-10 LAB — DIFFERENTIAL
Basophils Absolute: 0 10*3/uL (ref 0.0–0.1)
Eosinophils Absolute: 0.3 10*3/uL (ref 0.0–0.7)
Lymphocytes Relative: 13 % (ref 12–46)
Neutro Abs: 3.9 10*3/uL (ref 1.7–7.7)
Neutrophils Relative %: 71 % (ref 43–77)

## 2011-07-10 LAB — CBC
MCHC: 30.2 g/dL (ref 30.0–36.0)
Platelets: 140 10*3/uL — ABNORMAL LOW (ref 150–400)
RDW: 17.7 % — ABNORMAL HIGH (ref 11.5–15.5)

## 2011-07-10 LAB — GLUCOSE, CAPILLARY: Glucose-Capillary: 158 mg/dL — ABNORMAL HIGH (ref 70–99)

## 2011-07-10 LAB — PREPARE RBC (CROSSMATCH)

## 2011-07-10 MED ORDER — VITAMIN D (ERGOCALCIFEROL) 1.25 MG (50000 UNIT) PO CAPS
50000.0000 [IU] | ORAL_CAPSULE | ORAL | Status: DC
  Filled 2011-07-10: qty 1

## 2011-07-10 MED ORDER — VITAMIN B-12 1000 MCG PO TABS
1000.0000 ug | ORAL_TABLET | Freq: Every day | ORAL | Status: DC
  Filled 2011-07-10: qty 1

## 2011-07-10 MED ORDER — SODIUM CHLORIDE 0.9 % IV SOLN
INTRAVENOUS | Status: AC
  Administered 2011-07-10: 17:00:00 via INTRAVENOUS

## 2011-07-10 MED ORDER — ACETAMINOPHEN 650 MG RE SUPP: 650.0000 mg | Freq: Four times a day (QID) | RECTAL | Status: DC | PRN

## 2011-07-10 MED ORDER — ACETAMINOPHEN 325 MG PO TABS: 650.0000 mg | ORAL_TABLET | Freq: Four times a day (QID) | ORAL | Status: DC | PRN

## 2011-07-10 MED ORDER — SALINE NASAL SPRAY 0.65 % NA SOLN: 1.0000 | NASAL | Status: DC | PRN

## 2011-07-10 MED ORDER — INSULIN GLARGINE 100 UNIT/ML ~~LOC~~ SOLN
46.0000 [IU] | Freq: Every day | SUBCUTANEOUS | Status: DC
  Administered 2011-07-10: 23 [IU] via SUBCUTANEOUS

## 2011-07-10 MED ORDER — ONDANSETRON HCL 4 MG/2ML IJ SOLN: 4.0000 mg | Freq: Four times a day (QID) | INTRAMUSCULAR | Status: DC | PRN

## 2011-07-10 MED ORDER — ONDANSETRON HCL 4 MG PO TABS: 4.0000 mg | ORAL_TABLET | Freq: Four times a day (QID) | ORAL | Status: DC | PRN

## 2011-07-10 MED ORDER — SODIUM CHLORIDE 0.9 % IV SOLN
INTRAVENOUS | Status: DC
  Administered 2011-07-10: 12:00:00 via INTRAVENOUS

## 2011-07-10 MED ORDER — INSULIN ASPART 100 UNIT/ML ~~LOC~~ SOLN
0.0000 [IU] | Freq: Three times a day (TID) | SUBCUTANEOUS | Status: DC
  Administered 2011-07-10: 2 [IU] via SUBCUTANEOUS

## 2011-07-10 MED ORDER — SALINE SPRAY 0.65 % NA SOLN
1.0000 | NASAL | Status: DC | PRN
  Filled 2011-07-10: qty 44

## 2011-07-10 MED ORDER — PANTOPRAZOLE SODIUM 40 MG PO TBEC
40.0000 mg | DELAYED_RELEASE_TABLET | Freq: Every day | ORAL | Status: DC
  Administered 2011-07-10: 40 mg via ORAL
  Filled 2011-07-10 (×2): qty 1

## 2011-07-10 MED ORDER — VITAMIN B-12 1000 MCG PO TABS
1000.0000 ug | ORAL_TABLET | Freq: Every day | ORAL | Status: DC
  Administered 2011-07-11: 1000 ug via ORAL
  Filled 2011-07-10: qty 1

## 2011-07-10 MED ORDER — ENALAPRIL MALEATE 20 MG PO TABS
20.0000 mg | ORAL_TABLET | Freq: Every day | ORAL | Status: DC
  Administered 2011-07-11: 20 mg via ORAL
  Filled 2011-07-10 (×2): qty 1

## 2011-07-10 NOTE — ED Notes (Signed)
Admitting MD at bedside.

## 2011-07-10 NOTE — ED Notes (Signed)
Per pt, has been tired, weak for 2 weeks, cant stay awake

## 2011-07-10 NOTE — H&P (Signed)
Patient's PCP: [name] Fendt, MD, MD Patient's hematologist: Dr. Jamse [name]  Chief Complaint: Generalized weakness  History of Present Illness: [name] is [name] 71 y.o. 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  Patient reported that for the last 2 week she's been feeling weak and fatigued.  She reports that she gets these symptoms whenever she feels anemic.  She had gone to urgent care few days ago at which time her hemoglobin was 7.5, she was instructed to follow up with her primary care physician.  She had contacted Dr. Hollice [name] office on 07/09/2011 for [name] followup.  Given her persistent weakness she presented to emergency department today where she was found to be anemic with [name] hemoglobin of 6.5 as [name] result the hospitalist service was asked to admit the patient for further care and management with the plan of patient being transfused 2 units of PRBC.  Patient denies any recent fevers, chills, nausea, vomiting, chest pain, abdominal pain, diarrhea, headaches, no bleeding or vision changes.  She does note being short of breath with her weakness.  Past Medical History  Diagnosis Date  . Anemia   . S/P endoscopy     with laser treatment  08/19/2006  . History of epistaxis   . Diabetes mellitus     insulin requiring.  Marland Kitchen Hypertension   . Hyperlipidemia   . Telangiectasia     Gastric   . Lichen planus     Both lower extremities  . GERD (gastroesophageal reflux disease)   . Heart murmur    Past Surgical History  Procedure Date  . Nose surgery   . Savory dilation 02/26/2011    Procedure: SAVORY DILATION;  Surgeon: [name], MD;  Location: [name];  Service: Endoscopy;  Laterality: N/[name];  c-arm needed  . Esophagogastroduodenoscopy 02/26/2011    Procedure:  ESOPHAGOGASTRODUODENOSCOPY (EGD);  Surgeon: [name], MD;  Location: [name];  Service: Endoscopy;  Laterality: N/[name];   Family History  Problem Relation Age of Onset  . Breast cancer    . Malignant hyperthermia Neg Hx   . Stroke Father    History   Social History  . Marital Status: Married    Spouse Name: N/[name]    Number of Children: N/[name]  . Years of Education: N/[name]   Occupational History  . Not on file.   Social History Main Topics  . Smoking status: Never Smoker   . Smokeless tobacco: Never Used  . Alcohol Use: No  . Drug Use: No  . Sexually Active: No   Other Topics Concern  . Not on file   Social History Narrative  . No narrative on file   Allergies: Aspirin  Meds: Scheduled Meds:  Continuous Infusions:   . sodium chloride Stopped (07/10/11 1424)   PRN Meds:.  Review of Systems: All systems reviewed with the patient and positive as per history of present illness, otherwise all other systems are negative.  Physical Exam: Blood pressure 125/40, pulse 64, temperature 98.8 F (37.1 C), temperature source Oral, resp. rate 16, SpO2 98.00%. General: Awake, Oriented x3, No acute distress. HEENT: EOMI, Moist mucous membranes, pallor of the conjunctiva noted. Neck: Supple CV: S1 and S2 Lungs: Clear to ascultation bilaterally Abdomen: Soft, Nontender, Nondistended, +bowel sounds. Ext: Good pulses. Trace edema. No clubbing or cyanosis noted. Neuro:  Cranial Nerves II-XII grossly intact. Has 5/5 motor strength in upper and lower extremities.  Lab results:  [facility] 07/10/11 1130 07/08/11 1835  NA 140 143  K 4.0 4.0  CL 105 106  CO2 26 --  GLUCOSE 218* 202*  BUN 14 15  CREATININE 0.72 0.80  CALCIUM 9.2 --  MG -- --  PHOS -- --   No results found for this basename: AST:2,ALT:2,ALKPHOS:2,BILITOT:2,PROT:2,ALBUMIN:2 in the last 72 hours No results found for this basename: LIPASE:2,AMYLASE:2 in the last 72 hours  Basename 07/10/11 1130 07/08/11 1835    WBC 5.4 --  NEUTROABS 3.9 --  HGB 6.5* 7.5*  HCT 21.5* 22.0*  MCV 76.2* --  PLT 140* --    Basename 07/10/11 1130  CKTOTAL --  CKMB --  CKMBINDEX --  TROPONINI <0.30   No components found with this basename: POCBNP:3 No results found for this basename: DDIMER in the last 72 hours No results found for this basename: HGBA1C:2 in the last 72 hours No results found for this basename: CHOL:2,HDL:2,LDLCALC:2,TRIG:2,CHOLHDL:2,LDLDIRECT:2 in the last 72 hours No results found for this basename: TSH,T4TOTAL,FREET3,T3FREE,THYROIDAB in the last 72 hours No results found for this basename: VITAMINB12:2,FOLATE:2,FERRITIN:2,TIBC:2,IRON:2,RETICCTPCT:2 in the last 72 hours Imaging results:  No results found. Other results: EKG: normal sinus rhythm.  Assessment & Plan by Problem: Anemia Etiology unclear. 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 Patient is being transfused 2 units of pRBCs. Patient had [name] GI evaluation in January of 2013 and had gastric AVM ablated by argon laser, patient denies any overt GI bleed or any bleeding.  Discussed with Dr. Hollice [name] nurse, Dr. Jamse [name] had no further recommendations other than blood transfusion and having the patient followup with PCP after discharge and Dr. Jamse [name] in clinic.  Continue PPI.  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  Continue Lantus.  Sensitive sliding scale insulin.  Hypertension Continue enalapril.  Hyperlipidemia Stable.  Generalized weakness Likely due to anemia.  Likely will improve after blood transfusion.  Prophylaxis SCDs.  Continue PPI.  Disposition Admit to MedSurg.  If hemoglobin stable after blood transfusion, consider discharge in 24 hours if weakness improves.  Time spent on admission, talking to the patient, and coordinating care was: 45 mins.  [name] A, MD 07/10/2011, 3:57 PM

## 2011-07-10 NOTE — ED Notes (Signed)
Report called to floor, RN called back and stated that patient room is not clean, states she will call when room is available, housekeeper was at bedside

## 2011-07-10 NOTE — ED Provider Notes (Addendum)
History     CSN: [id number]  Arrival date & time 07/10/11  1039   First MD Initiated Contact with Patient 07/10/11 1056      Chief Complaint  Patient presents with  . Weakness    (Consider location/radiation/quality/duration/timing/severity/associated sxs/prior treatment) Patient is a 71 y.o. female presenting with weakness.  Weakness Primary symptoms do not include dizziness, fever, nausea or vomiting.  Additional symptoms include weakness.  pt c/o weakness, fatigue for a few weeks. She denies cough, sob, n/v/f/c/diarrhea/uti sxs.   She says she has hx of anemia from unknown src for which she periodically gets transfusions of prbcs and iron.    She denies hx of pud.   Her husband does not think she looks pale.    Past Medical History  Diagnosis Date  . Anemia   . S/P endoscopy     with laser treatment  08/19/2006  . History of epistaxis   . Diabetes mellitus     insulin requiring.  Marland Kitchen Hypertension   . Hyperlipidemia   . Telangiectasia     Gastric   . Lichen planus     Both lower extremities  . GERD (gastroesophageal reflux disease)   . Heart murmur     Past Surgical History  Procedure Date  . Nose surgery   . Savory dilation 02/26/2011    Procedure: SAVORY DILATION;  Surgeon: [name], MD;  Location: [name];  Service: Endoscopy;  Laterality: N/A;  c-arm needed  . Esophagogastroduodenoscopy 02/26/2011    Procedure: ESOPHAGOGASTRODUODENOSCOPY (EGD);  Surgeon: [name], MD;  Location: [name];  Service: Endoscopy;  Laterality: N/A;    Family History  Problem Relation Age of Onset  . Breast cancer    . Malignant hyperthermia Neg Hx     History  Substance Use Topics  . Smoking status: Never Smoker   . Smokeless tobacco: Not on file  . Alcohol Use: No    OB History    Grav Para Term Preterm Abortions TAB SAB Ect Mult Living                  Review of Systems  Constitutional: Negative for fever and chills.  Respiratory: Negative for  cough, chest tightness and shortness of breath.   Cardiovascular: Negative for chest pain.  Gastrointestinal: Negative for nausea, vomiting, abdominal pain and blood in stool.  Skin: Negative for pallor.  Neurological: Positive for weakness. Negative for dizziness and light-headedness.  Psychiatric/Behavioral: Negative for confusion.  All other systems reviewed and are negative.    Allergies  Aspirin  Home Medications   Current Outpatient Rx  Name Route Sig Dispense Refill  . ENALAPRIL MALEATE 20 MG PO TABS Oral Take 20 mg by mouth daily.    Marland Kitchen HYDRALAZINE HCL 10 MG PO TABS Oral Take 10 mg by mouth as needed. For itching    . INSULIN GLARGINE 100 UNIT/ML [place] SOLN Subcutaneous Inject 46 Units into the skin at bedtime.     Marland Kitchen METFORMIN HCL 1000 MG PO TABS Oral Take 1,000 mg by mouth daily with breakfast.     . PANTOPRAZOLE SODIUM 40 MG PO TBEC Oral Take 40 mg by mouth at bedtime.     Marland Kitchen SALINE NASAL SPRAY 0.65 % NA SOLN Nasal Place 1 spray into the nose as needed. For stuffy nose    . TRIAMCINOLONE ACETONIDE 0.1 % EX OINT Topical Apply 1 application topically 3 (three) times a week.    Marland Kitchen VITAMIN B-12 1000 MCG PO  TABS Oral Take 1,000 mcg by mouth daily with breakfast.     . VITAMIN D (ERGOCALCIFEROL) 50000 UNITS PO CAPS Oral Take 50,000 Units by mouth every 7 (seven) days. On tuesday      BP 147/52  Pulse 70  Temp(Src) 98.8 F (37.1 C) (Oral)  Resp 20  SpO2 97%  Physical Exam  Nursing note and vitals reviewed. Constitutional: She is oriented to person, place, and time. She appears well-developed and well-nourished. No distress.  HENT:  Head: Normocephalic and atraumatic.  Eyes: EOM are normal. Right eye exhibits no discharge. Left eye exhibits no discharge.       Pale conjunctiva  Neck: Normal range of motion. Neck supple.  Cardiovascular: Normal rate.   Murmur heard. Pulmonary/Chest: Effort normal. No respiratory distress.  Abdominal: Soft. She exhibits no distension. There is  no tenderness.  Musculoskeletal: Normal range of motion. She exhibits no edema and no tenderness.  Neurological: She is alert and oriented to person, place, and time.  Skin: Skin is warm and dry.  Psychiatric: She has a normal mood and affect. Thought content normal.    ED Course  Procedures (including critical care time)  Labs Reviewed  CBC - Abnormal; Notable for the following:    RBC 2.82 (*)    Hemoglobin 6.5 (*)    HCT 21.5 (*)    MCV 76.2 (*)    MCH 23.0 (*)    RDW 17.7 (*)    All other components within normal limits  DIFFERENTIAL  BASIC METABOLIC PANEL  URINALYSIS, ROUTINE W REFLEX MICROSCOPIC  TROPONIN I  PREPARE RBC (CROSSMATCH)   No results found.   No diagnosis found.  1:01 PM Spoke with dr. Reece Levy. He will admit   Date: 07/10/2011  Rate: 68  Rhythm: normal sinus rhythm  QRS Axis: normal  Intervals: normal  ST/T Wave abnormalities: normal  Conduction Disutrbances: none  Narrative Interpretation: unremarkable     MDM  anemia        Barbara Cower, MD 07/10/11 [place], MD 07/10/11 1301  Barbara Cower, MD 07/10/11 1537

## 2011-07-10 NOTE — ED Notes (Signed)
Pt being transported to floor by ED Terance Ice

## 2011-07-11 DIAGNOSIS — D649 Anemia, unspecified: Secondary | ICD-10-CM

## 2011-07-11 DIAGNOSIS — R5381 Other malaise: Secondary | ICD-10-CM

## 2011-07-11 DIAGNOSIS — R5383 Other fatigue: Secondary | ICD-10-CM

## 2011-07-11 DIAGNOSIS — E1165 Type 2 diabetes mellitus with hyperglycemia: Secondary | ICD-10-CM

## 2011-07-11 DIAGNOSIS — I10 Essential (primary) hypertension: Secondary | ICD-10-CM

## 2011-07-11 LAB — TYPE AND SCREEN: Unit division: 0

## 2011-07-11 LAB — BASIC METABOLIC PANEL
CO2: 24 mEq/L (ref 19–32)
Chloride: 107 mEq/L (ref 96–112)
Glucose, Bld: 78 mg/dL (ref 70–99)
Potassium: 3.9 mEq/L (ref 3.5–5.1)
Sodium: 142 mEq/L (ref 135–145)

## 2011-07-11 LAB — CBC
Hemoglobin: 9.1 g/dL — ABNORMAL LOW (ref 12.0–15.0)
MCH: 24.9 pg — ABNORMAL LOW (ref 26.0–34.0)
MCV: 78.1 fL (ref 78.0–100.0)
RBC: 3.66 MIL/uL — ABNORMAL LOW (ref 3.87–5.11)
WBC: 7.4 10*3/uL (ref 4.0–10.5)

## 2011-07-11 LAB — GLUCOSE, CAPILLARY

## 2011-07-11 NOTE — Discharge Summary (Signed)
Discharge Summary  FAVOUR KEEGAN MR#: [id number]  DOB:1940-04-17  Date of Admission: 07/10/2011 Date of Discharge: 07/11/2011  Patient's PCP: Philis Fendt, MD, MD  Attending Physician:Stanely Sexson A  Consults: None  Discharge Diagnoses: Principal Problem:  *Anemia Active Problems:  DM (diabetes mellitus), type 2 with complications  HYPERLIPIDEMIA  HYPERTENSION, BENIGN SYSTEMIC  Gastric AVM  GI bleed  Hypokalemia  Osler-Weber-Rendu disease  Generalized weakness   Brief Admitting History and Physical [name] [name] is a 71 y.o. 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  Discharge Medications Medication List  As of 07/11/2011  9:33 AM   TAKE these medications         enalapril 20 MG tablet   Commonly known as: VASOTEC   Take 20 mg by mouth daily.      hydrALAZINE 10 MG tablet   Commonly known as: APRESOLINE   Take 10 mg by mouth as needed. For itching      insulin glargine 100 UNIT/ML injection   Commonly known as: LANTUS   Inject 46 Units into the skin at bedtime.      metFORMIN 1000 MG tablet   Commonly known as: GLUCOPHAGE   Take 1,000 mg by mouth daily with breakfast.      pantoprazole 40 MG tablet   Commonly known as: PROTONIX   Take 40 mg by mouth at bedtime.      sodium chloride 0.65 % nasal spray   Commonly known as: OCEAN   Place 1 spray into the nose as needed. For stuffy nose      triamcinolone ointment 0.1 %   Commonly known as: KENALOG   Apply 1 application topically 3 (three) times a week.      vitamin B-12 1000 MCG tablet   Commonly known as: CYANOCOBALAMIN   Take 1,000 mcg by mouth daily with breakfast.      Vitamin D (Ergocalciferol) 50000 UNITS Caps   Commonly known as: DRISDOL   Take 50,000 Units by mouth every 7  (seven) days. On tuesday            Hospital Course: Anemia  Status post 2 units of blood transfusion on 07/10/2011. 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 Discussed with Dr. Hollice Espy nurse on 07/10/2011, Dr. Jamse Arn had no further recommendations other than blood transfusion and having the patient followup with PCP after discharge and Dr. Jamse Arn in clinic as already scheduled. Continue PPI.   Type 2 diabetes with complications  Resume metformin at discharge. Continue Lantus.  Hypertension  Continue enalapril. Stable.  Hyperlipidemia  Stable.   Generalized weakness  Likely due to anemia.  Improved with blood transfusion.   Day of Discharge BP 133/63  Pulse 69  Temp(Src) 98.6 F (37 C) (Oral)  Resp 20  Ht 5\' 2"  (1.575 m)  Wt 74.1 kg (163 lb 5.8 oz)  BMI 29.88 kg/m2  SpO2 96%  Results for orders placed during the hospital encounter of 07/10/11 (from the past 48 hour(s))  CBC     Status: Abnormal   Collection Time   07/10/11 11:30 AM      Component Value Range Comment   WBC 5.4  4.0 - 10.5 (K/uL)    RBC 2.82 (*) 3.87 - 5.11 (MIL/uL)    Hemoglobin 6.5 (*) 12.0 - 15.0 (g/dL)    HCT 21.5 (*) 36.0 - 46.0 (%)    MCV 76.2 (*) 78.0 - 100.0 (fL)    MCH 23.0 (*) 26.0 - 34.0 (pg)    MCHC 30.2  30.0 - 36.0 (g/dL)    RDW 17.7 (*) 11.5 - 15.5 (%)    Platelets 140 (*) 150 - 400 (K/uL)   DIFFERENTIAL     Status: Abnormal   Collection Time   07/10/11 11:30 AM      Component Value Range Comment   Neutrophils Relative 71  43 - 77 (%)    Lymphocytes Relative 13  12 - 46 (%)    Monocytes Relative 10  3 - 12 (%)    Eosinophils Relative 6 (*) 0 - 5 (%)    Basophils Relative 0  0 - 1 (%)    Neutro Abs 3.9  1.7 - 7.7 (K/uL)    Lymphs Abs 0.7  0.7 - 4.0 (K/uL)    Monocytes Absolute 0.5  0.1 - 1.0 (K/uL)     Eosinophils Absolute 0.3  0.0 - 0.7 (K/uL)    Basophils Absolute 0.0  0.0 - 0.1 (K/uL)    Smear Review MORPHOLOGY UNREMARKABLE     BASIC METABOLIC PANEL     Status: Abnormal   Collection Time   07/10/11 11:30 AM      Component Value Range Comment   Sodium 140  135 - 145 (mEq/L)    Potassium 4.0  3.5 - 5.1 (mEq/L)    Chloride 105  96 - 112 (mEq/L)    CO2 26  19 - 32 (mEq/L)    Glucose, Bld 218 (*) 70 - 99 (mg/dL)    BUN 14  6 - 23 (mg/dL)    Creatinine, Ser 0.72  0.50 - 1.10 (mg/dL)    Calcium 9.2  8.4 - 10.5 (mg/dL)    GFR calc non Af Amer 84 (*) >90 (mL/min)    GFR calc Af Amer >90  >90 (mL/min)   TROPONIN I     Status: Normal   Collection Time   07/10/11 11:30 AM      Component Value Range Comment   Troponin I <0.30  <0.30 (ng/mL)   URINALYSIS, ROUTINE W REFLEX MICROSCOPIC     Status: Normal   Collection Time   07/10/11 11:59 AM      Component Value Range Comment   Color, Urine YELLOW  YELLOW     APPearance CLEAR  CLEAR     Specific Gravity, Urine 1.011  1.005 - 1.030     pH 5.5  5.0 - 8.0     Glucose, UA NEGATIVE  NEGATIVE (mg/dL)    Hgb urine dipstick NEGATIVE  NEGATIVE     Bilirubin Urine NEGATIVE  NEGATIVE     Ketones, ur NEGATIVE  NEGATIVE (mg/dL)    Protein, ur NEGATIVE  NEGATIVE (mg/dL)    Urobilinogen, UA 0.2  0.0 - 1.0 (mg/dL)    Nitrite NEGATIVE  NEGATIVE     Leukocytes, UA NEGATIVE  NEGATIVE  MICROSCOPIC NOT DONE ON URINES WITH NEGATIVE PROTEIN, BLOOD, LEUKOCYTES, NITRITE, OR GLUCOSE <1000 mg/dL.  PREPARE RBC (CROSSMATCH)     Status: Normal   Collection Time   07/10/11 12:30 PM      Component Value Range Comment   Order Confirmation ORDER PROCESSED BY BLOOD BANK     TYPE AND  SCREEN     Status: Normal (Preliminary result)   Collection Time   07/10/11  1:00 PM      Component Value Range Comment   ABO/RH(D) A POS      Antibody Screen NEG      Sample Expiration 07/13/2011      Unit Number [id number]      Blood Component Type RED CELLS,LR      Unit division 00       Status of Unit ISSUED      Transfusion Status OK TO TRANSFUSE      Crossmatch Result Compatible      Unit Number [id number]      Blood Component Type RED CELLS,LR      Unit division 00      Status of Unit ISSUED      Transfusion Status OK TO TRANSFUSE      Crossmatch Result Compatible     GLUCOSE, CAPILLARY     Status: Abnormal   Collection Time   07/10/11  5:00 PM      Component Value Range Comment   Glucose-Capillary 163 (*) 70 - 99 (mg/dL)    Comment 1 Documented in Chart      Comment 2 Notify RN     GLUCOSE, CAPILLARY     Status: Abnormal   Collection Time   07/10/11  9:43 PM      Component Value Range Comment   Glucose-Capillary 158 (*) 70 - 99 (mg/dL)    Comment 1 Notify RN     BASIC METABOLIC PANEL     Status: Abnormal   Collection Time   07/11/11  3:45 AM      Component Value Range Comment   Sodium 142  135 - 145 (mEq/L)    Potassium 3.9  3.5 - 5.1 (mEq/L)    Chloride 107  96 - 112 (mEq/L)    CO2 24  19 - 32 (mEq/L)    Glucose, Bld 78  70 - 99 (mg/dL)    BUN 13  6 - 23 (mg/dL)    Creatinine, Ser 0.94  0.50 - 1.10 (mg/dL)    Calcium 9.2  8.4 - 10.5 (mg/dL)    GFR calc non Af Amer 60 (*) >90 (mL/min)    GFR calc Af Amer 69 (*) >90 (mL/min)   CBC     Status: Abnormal   Collection Time   07/11/11  3:45 AM      Component Value Range Comment   WBC 7.4  4.0 - 10.5 (K/uL)    RBC 3.66 (*) 3.87 - 5.11 (MIL/uL)    Hemoglobin 9.1 (*) 12.0 - 15.0 (g/dL)    HCT 28.6 (*) 36.0 - 46.0 (%)    MCV 78.1  78.0 - 100.0 (fL)    MCH 24.9 (*) 26.0 - 34.0 (pg)    MCHC 31.8  30.0 - 36.0 (g/dL)    RDW 17.1 (*) 11.5 - 15.5 (%)    Platelets 141 (*) 150 - 400 (K/uL)   GLUCOSE, CAPILLARY     Status: Abnormal   Collection Time   07/11/11  7:41 AM      Component Value Range Comment   Glucose-Capillary 119 (*) 70 - 99 (mg/dL)    Comment 1 Notify RN       No results found.   Disposition: Home  Diet: Diabetic diet  Activity: Resume as tolerated.   Follow-up Appts: Discharge Orders      Future Appointments: Provider: Department: Dept Phone: Center:  08/03/2011 9:30 AM Shanon Payor [facility] [phone] None   08/05/2011 9:30 AM Nira Retort, MD [facility] [phone] None   08/05/2011 10:45 AM [facility] [phone] None     Future Orders Please Complete By Expires   Diet Carb Modified      Increase activity slowly      Discharge instructions      Comments:   Followup with Philis Fendt, MD (PCP) in 1 week. Followup with Dr. Jamse Arn, as previously scheduled.      TESTS THAT NEED FOLLOW-UP None  Time spent on discharge, talking to the patient, and coordinating care: 25 mins.   Signed: Bynum Bellows, MD 07/11/2011, 9:34 AM

## 2011-07-11 NOTE — Progress Notes (Signed)
DC to home. To car by wc. No change from am assessment. Pt states feeling much less fatigue than before transfusion.Harlene Ramus

## 2011-07-11 NOTE — Progress Notes (Signed)
Subjective: Patient received 2 units of blood.  Feeling more energetic and better after blood transfusion.  Ambulating in the room.  Objective: Vital signs in last 24 hours: Filed Vitals:   07/10/11 2204 07/10/11 2300 07/10/11 2345 07/11/11 0445  BP: 181/70 161/62 161/65 133/63  Pulse: 69 72 82 69  Temp: 99.1 F (37.3 C) 99.2 F (37.3 C) 99.6 F (37.6 C) 98.6 F (37 C)  TempSrc: Oral Oral Oral Oral  Resp: 18 16 16 20   Height:      Weight:      SpO2: 100%   96%   Weight change:   Intake/Output Summary (Last 24 hours) at 07/11/11 0927 Last data filed at 07/11/11 0553  Gross per 24 hour  Intake 2447.02 ml  Output      0 ml  Net 2447.02 ml    Physical Exam: General: Awake, Oriented, No acute distress. HEENT: EOMI. Neck: Supple CV: S1 and S2 Lungs: Clear to ascultation bilaterally Abdomen: Soft, Nontender, Nondistended, +bowel sounds. Ext: Good pulses. Trace edema.  Lab Results: Basic Metabolic Panel:  Lab [id number] 07/10/11 1130 07/08/11 1835  NA 142 140 143  K 3.9 4.0 4.0  CL 107 105 106  CO2 24 26 --  GLUCOSE 78 218* 202*  BUN 13 14 15   CREATININE 0.94 0.72 0.80  CALCIUM 9.2 9.2 --  MG -- -- --  PHOS -- -- --   Liver Function Tests: No results found for this basename: AST:5,ALT:5,ALKPHOS:5,BILITOT:5,PROT:5,ALBUMIN:5 in the last 168 hours No results found for this basename: LIPASE:5,AMYLASE:5 in the last 168 hours No results found for this basename: AMMONIA:5 in the last 168 hours CBC:  Lab 07/11/11 0345 07/10/11 1130 07/08/11 1835  WBC 7.4 5.4 --  NEUTROABS -- 3.9 --  HGB 9.1* 6.5* 7.5*  HCT 28.6* 21.5* 22.0*  MCV 78.1 76.2* --  PLT 141* 140* --   Cardiac Enzymes:  Lab 07/10/11 1130  CKTOTAL --  CKMB --  CKMBINDEX --  TROPONINI <0.30   BNP (last 3 results) No results found for this basename: PROBNP:3 in the last 8760 hours CBG:  Lab 07/11/11 0741 07/10/11 2143 07/10/11 1700  GLUCAP 119* 158* 163*   No results found for this  basename: HGBA1C:5 in the last 72 hours Other Labs: No components found with this basename: POCBNP:3 No results found for this basename: DDIMER:2 in the last 168 hours No results found for this basename: CHOL:2,HDL:2,LDLCALC:2,TRIG:2,CHOLHDL:2,LDLDIRECT:2 in the last 168 hours No results found for this basename: TSH,T4TOTAL,FREET3,T3FREE,FREET4,THYROIDAB in the last 168 hours No results found for this basename: VITAMINB12:2,FOLATE:2,FERRITIN:2,TIBC:2,IRON:2,RETICCTPCT:2 in the last 168 hours  Micro Results: No results found for this or any previous visit (from the past 240 hour(s)).  Studies/Results: No results found.  Medications: I have reviewed the patient's current medications. Scheduled Meds:   . enalapril  20 mg Oral Daily  . insulin aspart  0-9 Units Subcutaneous TID WC  . insulin glargine  46 Units Subcutaneous QHS  . pantoprazole  40 mg Oral QHS  . vitamin B-12  1,000 mcg Oral Daily  . Vitamin D (Ergocalciferol)  50,000 Units Oral Q7 days  . DISCONTD: vitamin B-12  1,000 mcg Oral Q breakfast   Continuous Infusions:   . sodium chloride 75 mL/hr at 07/10/11 1707  . DISCONTD: sodium chloride Stopped (07/10/11 1424)   PRN Meds:.acetaminophen, acetaminophen, ondansetron (ZOFRAN) IV, ondansetron, sodium chloride, DISCONTD: sodium chloride  Assessment/Plan: Anemia  Status post 2 units of blood transfusion on 07/10/2011. 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 Discussed with Dr. Hollice Espy nurse on 07/10/2011, Dr. Jamse Arn had no further recommendations other than blood transfusion and having the patient followup with PCP after discharge and Dr. Jamse Arn in clinic. Continue PPI.   Type 2 diabetes with complications  resume metformin at discharge. Continue Lantus. Sensitive sliding scale insulin.    Hypertension  Continue enalapril.   Hyperlipidemia  Stable.   Generalized weakness  Likely due to anemia.  Improved with blood transfusion.   Prophylaxis  SCDs. Continue PPI.   Disposition  discharge the patient today.   LOS: 1 day  Alyssandra Hulsebus A, MD 07/11/2011, 9:27 AM

## 2011-07-15 ENCOUNTER — Other Ambulatory Visit: Payer: Self-pay | Admitting: *Deleted

## 2011-07-15 ENCOUNTER — Telehealth: Payer: Self-pay | Admitting: *Deleted

## 2011-07-15 NOTE — Telephone Encounter (Signed)
per orders from 07-15-2011 moved patient to NP [place] schedule same time of 9:30am

## 2011-08-03 ENCOUNTER — Other Ambulatory Visit (HOSPITAL_BASED_OUTPATIENT_CLINIC_OR_DEPARTMENT_OTHER): Payer: Medicare Other | Admitting: Lab

## 2011-08-03 DIAGNOSIS — D539 Nutritional anemia, unspecified: Secondary | ICD-10-CM

## 2011-08-03 LAB — CBC WITH DIFFERENTIAL/PLATELET
BASO%: 0.5 % (ref 0.0–2.0)
EOS%: 7.8 % — ABNORMAL HIGH (ref 0.0–7.0)
LYMPH%: 16.8 % (ref 14.0–49.7)
MCH: 25 pg — ABNORMAL LOW (ref 25.1–34.0)
MCHC: 32.2 g/dL (ref 31.5–36.0)
MONO#: 0.5 10*3/uL (ref 0.1–0.9)
NEUT%: 63.9 % (ref 38.4–76.8)
Platelets: 284 10*3/uL (ref 145–400)
RBC: 3.58 10*6/uL — ABNORMAL LOW (ref 3.70–5.45)
WBC: 4.5 10*3/uL (ref 3.9–10.3)
lymph#: 0.8 10*3/uL — ABNORMAL LOW (ref 0.9–3.3)

## 2011-08-03 LAB — BASIC METABOLIC PANEL
CO2: 28 mEq/L (ref 19–32)
Calcium: 9.4 mg/dL (ref 8.4–10.5)
Creatinine, Ser: 0.75 mg/dL (ref 0.50–1.10)
Sodium: 143 mEq/L (ref 135–145)

## 2011-08-03 LAB — IRON AND TIBC
Iron: 41 ug/dL — ABNORMAL LOW (ref 42–145)
UIBC: 294 ug/dL (ref 125–400)

## 2011-08-03 LAB — FOLATE: Folate: 18 ng/mL

## 2011-08-03 LAB — FERRITIN: Ferritin: 8 ng/mL — ABNORMAL LOW (ref 10–291)

## 2011-08-05 ENCOUNTER — Ambulatory Visit (HOSPITAL_BASED_OUTPATIENT_CLINIC_OR_DEPARTMENT_OTHER): Payer: Medicare Other | Admitting: Nurse Practitioner

## 2011-08-05 ENCOUNTER — Ambulatory Visit (HOSPITAL_BASED_OUTPATIENT_CLINIC_OR_DEPARTMENT_OTHER): Payer: Medicare Other

## 2011-08-05 ENCOUNTER — Telehealth: Payer: Self-pay | Admitting: Hematology and Oncology

## 2011-08-05 VITALS — BP 130/53 | HR 80 | Temp 99.0°F | Ht 62.0 in | Wt 155.1 lb

## 2011-08-05 DIAGNOSIS — D508 Other iron deficiency anemias: Secondary | ICD-10-CM

## 2011-08-05 DIAGNOSIS — I780 Hereditary hemorrhagic telangiectasia: Secondary | ICD-10-CM

## 2011-08-05 DIAGNOSIS — D509 Iron deficiency anemia, unspecified: Secondary | ICD-10-CM

## 2011-08-05 DIAGNOSIS — R040 Epistaxis: Secondary | ICD-10-CM

## 2011-08-05 MED ORDER — SODIUM CHLORIDE 0.9 % IV SOLN
1020.0000 mg | Freq: Once | INTRAVENOUS | Status: DC
  Filled 2011-08-05: qty 34

## 2011-08-05 MED ORDER — SODIUM CHLORIDE 0.9 % IV SOLN
Freq: Once | INTRAVENOUS | Status: AC
  Administered 2011-08-05: 11:00:00 via INTRAVENOUS

## 2011-08-05 MED ORDER — SODIUM CHLORIDE 0.9 % IV SOLN
1020.0000 mg | Freq: Once | INTRAVENOUS | Status: AC
  Administered 2011-08-05: 1020 mg via INTRAVENOUS
  Filled 2011-08-05: qty 34

## 2011-08-05 NOTE — Telephone Encounter (Signed)
appts made and prionted for [name]

## 2011-08-05 NOTE — Patient Instructions (Signed)
Feraheme (Ferumoxytol)  What is this medicine? FERUMOXYTOL is an iron complex. Iron is used to make healthy red blood cells, which carry oxygen and nutrients throughout the body. This medicine is used to treat iron deficiency anemia in people with chronic kidney disease. This medicine may be used for other purposes; ask your health care provider or pharmacist if you have questions.  What should I tell my health care provider before I take this medicine? They need to know if you have any of these conditions: -anemia not caused by low iron levels -high levels of iron in the blood -magnetic resonance imaging (MRI) test scheduled -an unusual or allergic reaction to iron, other medicines, foods, dyes, or preservatives -pregnant or trying to get pregnant -breast-feeding  How should I use this medicine? This medicine is for infusion into a vein. It is given by a health care professional in a hospital or clinic setting. Talk to your pediatrician regarding the use of this medicine in children. Special care may be needed. Overdosage: If you think you've taken too much of this medicine contact a poison control center or emergency room at once. Overdosage: If you think you have taken too much of this medicine contact a poison control center or emergency room at once. NOTE: This medicine is only for you. Do not share this medicine with others. What if I miss a dose? It is important not to miss your dose. Call your doctor or health care professional if you are unable to keep an appointment.  What may interact with this medicine? This medicine may interact with the following medications: -other iron products This list may not describe all possible interactions. Give your health care provider a list of all the medicines, herbs, non-prescription drugs, or dietary supplements you use. Also tell them if you smoke, drink alcohol, or use illegal drugs. Some items may interact with your medicine.  What should  I watch for while using this medicine? Visit your doctor or healthcare professional regularly. Tell your doctor or healthcare professional if your symptoms do not start to get better or if they get worse. You may need blood work done while you are taking this medicine. You may need to follow a special diet. Talk to your doctor. Foods that contain iron include: whole grains/cereals, dried fruits, beans, or peas, leafy green vegetables, and organ meats (liver, kidney).  What side effects may I notice from receiving this medicine? Side effects that you should report to your doctor or health care professional as soon as possible: -allergic reactions like skin rash, itching or hives, swelling of the face, lips, or tongue -breathing problems -changes in blood pressure -feeling faint or lightheaded, falls -fever or chills -flushing, sweating, or hot feelings -swelling of the ankles or feet  Side effects that usually do not require medical attention (Report these to your doctor or health care professional if they continue or are bothersome.): -diarrhea -headache -nausea, vomiting -stomach pain This list may not describe all possible side effects. Call your doctor for medical advice about side effects. You may report side effects to FDA at 1-800-FDA-1088. Where should I keep my medicine? This drug is given in a hospital or clinic and will not be stored at home. NOTE: This sheet is a summary. It may not cover all possible information. If you have questions about this medicine, talk to your doctor, pharmacist, or health care provider.  2012, Elsevier/Gold Standard. (10/19/2007 9:48:25 PM) 

## 2011-08-05 NOTE — Progress Notes (Signed)
CC Nolene Ebbs, M.D. Ricard Dillon, M.D. Leonides Sake. Lucia Gaskins, M.D  1. Iron deficiency anemia  ferumoxytol (FERAHEME) 1,020 mg in sodium chloride 0.9 % 100 mL IVPB    CURRENT THERAPY:  INTERVAL HISTORY: [name] 71 y.o. female returns for  regular  visit for followup of iron deficiency anemia   Past Medical History  Diagnosis Date  . Anemia   . S/P endoscopy     with laser treatment  08/19/2006  . History of epistaxis   . Diabetes mellitus     insulin requiring.  Marland Kitchen Hypertension   . Hyperlipidemia   . Telangiectasia     Gastric   . Lichen planus     Both lower extremities  . GERD (gastroesophageal reflux disease)   . Heart murmur     has DM (diabetes mellitus), type 2 with complications; HYPERLIPIDEMIA; ANEMIA, IRON DEFICIENCY NOS; ANEMIA, ACUTE BLOOD LOSS; HYPERTENSION, BENIGN SYSTEMIC; SYMPTOM, EPISTAXIS; Anemia; Gastric AVM; GI bleed; Hypokalemia; Osler-Weber-Rendu disease; and Generalized weakness on her problem list.     is allergic to aspirin.  Ms. Cones does not currently have medications on file.  Past Surgical History  Procedure Date  . Nose surgery   . Savory dilation 02/26/2011    Procedure: SAVORY DILATION;  Surgeon: [name], MD;  Location: [name];  Service: Endoscopy;  Laterality: N/A;  c-arm needed  . Esophagogastroduodenoscopy 02/26/2011    Procedure: ESOPHAGOGASTRODUODENOSCOPY (EGD);  Surgeon: [name], MD;  Location: [name];  Service: Endoscopy;  Laterality: N/A;    Patient denies any headaches, dizziness, double vision, fevers, chills, night sweats, nausea, vomiting, diarrhea, chest pain, heart palpitations, shortness of breath, blood in stool, black tarry stool, urinary pain, urinary burning, urinary frequency, hematuria. Pt DOES admit to occasional constipation which is relieved with OTC laxatives; she also notes she has occasional nose bleeds "not that bad" and occasional hemoptysis which she states is a result of  nasal drainage, which also causes her to have a cough from time to time.   PHYSICAL EXAMINATION  ECOG PERFORMANCE STATUS: 0 - Asymptomatic  Filed Vitals:   08/05/11 0947  BP: 130/53  Pulse: 80  Temp: 99 F (37.2 C)    GENERAL:alert, healthy, no distress, well nourished, comfortable and cooperative SKIN: rash noted bilaterally to legs with healing scabbed area, dry itchy skin on back HEAD: Normocephalic, No masses, lesions, tenderness or abnormalities EYES: normal, PERRLA, EOMI, Conjunctiva are pink and non-injected, wearing corrective lenses EARS: External ears normal OROPHARYNX:no exudate and no erythema  NECK: supple, no adenopathy LYMPH:  no palpable lymphadenopathy, not examined BREAST:not examined LUNGS: clear to auscultation and percussion HEART: regular rate & rhythm, no murmurs, S1 normal and S2 normal ABDOMEN:abdomen soft, non-tender and normal bowel sounds BACK: Back symmetric, no curvature. EXTREMITIES:no joint deformities, effusion, or inflammation  NEURO: alert & oriented x 3 with fluent speech PELVIC:Not examined RECTAL: Deferred/ not examined    LABORATORY DATA:  Iron  Date/Time Value Range Status  08/03/2011  9:35 AM 41* 42 - 145 ug/dL Final    Ferritin  Date/Time Value Range Status  08/03/2011  9:35 AM 8* 10 - 291 ng/mL Final    Basename 08/03/11 0935  TIBC 335   HGB  Date/Time Value Range Status  08/03/2011  9:35 AM 9.0* 11.6 - 15.9 g/dL Final     Hemoglobin  Date/Time Value Range Status  07/11/2011  3:45 AM 9.1* 12.0 - 15.0 g/dL Final     DELTA CHECK NOTED  POST TRANSFUSION SPECIMEN   HCT  Date/Time Value Range Status  08/03/2011  9:35 AM 27.8* 34.8 - 46.6 % Final  07/11/2011  3:45 AM 28.6* 36.0 - 46.0 % Final   WBC  Date/Time Value Range Status  08/03/2011  9:35 AM 4.5  3.9 - 10.3 10e3/uL Final  07/11/2011  3:45 AM 7.4  4.0 - 10.5 K/uL Final   Platelets  Date/Time Value Range Status  08/03/2011  9:35 AM 284  145 - 400 10e3/uL Final    07/11/2011  3:45 AM 141* 150 - 400 K/uL Final     ASSESSMENT: 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 She has recurrent mild epistaxis but denies any recent GI symptoms. Ferritin level 8, with percent saturated 12. Hemoglobin 9. Patient tolerating very well.  PLAN: Feraheme infusion today and follow up visit in 3 months time with repeat infusion at that time and lab work a few days prior to infusion.  All questions were answered. The patient knows to call the clinic with any problems, questions or concerns. We can certainly see the patient much sooner if necessary.  The patient and plan discussed with Sophronia Simas, MD and he is in agreement with the aforementioned.  I spent 30 minutes counseling the patient face to face. The total time spent in the appointment was 30 minutes.  [facility] AOCNP, NP-C

## 2011-09-17 ENCOUNTER — Telehealth (HOSPITAL_COMMUNITY): Payer: Self-pay | Admitting: *Deleted

## 2011-09-17 ENCOUNTER — Encounter (HOSPITAL_COMMUNITY): Payer: Self-pay | Admitting: *Deleted

## 2011-09-17 ENCOUNTER — Emergency Department (HOSPITAL_COMMUNITY)
Admission: EM | Admit: 2011-09-17 | Discharge: 2011-09-17 | Disposition: A | Discharge status: Home / Self Care | Payer: Medicare Other | Source: Home / Self Care | Attending: Family Medicine | Admitting: Family Medicine

## 2011-09-17 DIAGNOSIS — R5383 Other fatigue: Secondary | ICD-10-CM

## 2011-09-17 DIAGNOSIS — K922 Gastrointestinal hemorrhage, unspecified: Secondary | ICD-10-CM

## 2011-09-17 DIAGNOSIS — D649 Anemia, unspecified: Secondary | ICD-10-CM

## 2011-09-17 LAB — POCT I-STAT, CHEM 8
BUN: 15 mg/dL (ref 6–23)
Chloride: 106 mEq/L (ref 96–112)
HCT: 30 % — ABNORMAL LOW (ref 36.0–46.0)
Potassium: 3.9 mEq/L (ref 3.5–5.1)

## 2011-09-17 LAB — CBC
Hemoglobin: 9.9 g/dL — ABNORMAL LOW (ref 12.0–15.0)
MCHC: 33.2 g/dL (ref 30.0–36.0)
RBC: 3.49 MIL/uL — ABNORMAL LOW (ref 3.87–5.11)

## 2011-09-17 LAB — OCCULT BLOOD, POC DEVICE: Fecal Occult Bld: POSITIVE

## 2011-09-17 NOTE — ED Notes (Signed)
Pt  Reports    Feeling  Weak  And  Tired       Foe  sev  Days  Pt is  A  Diabetic    Who  Takes  Insulin  She  Is  Awake  As  Well  As  Alert  And  Oriented      She  denys  Any  Chest pain or  Any  Shortness  Of breath    Her  Skin is  Warm  And  Dry

## 2011-09-17 NOTE — ED Notes (Signed)
Dr. Gunnar Bulla asked me to call pt. and tell her that her Hgb. was OK ( Hgb 9.9).  I called and left a message for pt. to call. Roselyn Meier 09/17/2011

## 2011-09-17 NOTE — ED Notes (Signed)
1313 Pt. Called and asked if she could come here to get her blood count checked. States she has a hx. of anemia. I asked if she had a PCP. She said yes but can't get in to see them quickly. I told her she could come here. Roselyn Meier 09/17/2011

## 2011-09-18 ENCOUNTER — Telehealth (HOSPITAL_COMMUNITY): Payer: Self-pay | Admitting: *Deleted

## 2011-09-18 NOTE — ED Notes (Signed)
Pt. called back on VM x 2 today. I called pt. and told her that Dr. Gunnar Bulla asked me to call her and let her know that her Hgb. was OK. It was 9.9.  She said OK. [name] 09/18/2011

## 2011-09-21 NOTE — ED Provider Notes (Signed)
History     CSN: [id number]  Arrival date & time 09/17/11  1435   First MD Initiated Contact with Patient 09/17/11 1448      Chief Complaint  Patient presents with  . Fatigue    (Consider location/radiation/quality/duration/timing/severity/associated sxs/prior treatment) HPI Comments: 71 y/o female with h/o DM and GI AVM among other co morbidities here c/o progressively feeling "fatigued and tired" for months but think symptoms are flaring up during the last week. She was admitted to the hospital in 06/2011 with Hg 6.5 that required blood transfusion. Her last hemoglobin was 9 in 07/2011 during a follow up with her hematologist. She is taking liquid iron. Denies current abdominal pain, red blood in stools or melena. States she came to have her blood checked to make sure her hemoglobin is Ok. Reports compliance with diabetes medications, although her blood sugar has been averaging around 200's (non fasting). Denies dysuria or hematuria, no fever or chills, denies chest pain, cough or congestion. No headaches or dizziness.  No nausea, vomiting or diarrhea.   Past Medical History  Diagnosis Date  . Anemia   . S/P endoscopy     with laser treatment  08/19/2006  . History of epistaxis   . Diabetes mellitus     insulin requiring.  Marland Kitchen Hypertension   . Hyperlipidemia   . Telangiectasia     Gastric   . Lichen planus     Both lower extremities  . GERD (gastroesophageal reflux disease)   . Heart murmur     Past Surgical History  Procedure Date  . Nose surgery   . Savory dilation 02/26/2011    Procedure: SAVORY DILATION;  Surgeon: [name], MD;  Location: [name];  Service: Endoscopy;  Laterality: N/A;  c-arm needed  . Esophagogastroduodenoscopy 02/26/2011    Procedure: ESOPHAGOGASTRODUODENOSCOPY (EGD);  Surgeon: [name], MD;  Location: [name];  Service: Endoscopy;  Laterality: N/A;    Family History  Problem Relation Age of Onset  . Breast cancer    . Malignant  hyperthermia Neg Hx   . Stroke Father     History  Substance Use Topics  . Smoking status: Never Smoker   . Smokeless tobacco: Never Used  . Alcohol Use: No    OB History    Grav Para Term Preterm Abortions TAB SAB Ect Mult Living                  Review of Systems  Constitutional: Positive for fatigue. Negative for fever, chills, diaphoresis, activity change and appetite change.  HENT: Negative for congestion, sore throat and trouble swallowing.   Eyes: Negative for visual disturbance.  Respiratory: Negative for cough, chest tightness and shortness of breath.   Cardiovascular: Negative for chest pain, palpitations and leg swelling.  Gastrointestinal: Negative for nausea, vomiting, diarrhea, constipation, blood in stool, abdominal distention, anal bleeding and rectal pain.  Genitourinary: Negative for dysuria, urgency, frequency, hematuria, flank pain and pelvic pain.  Skin: Negative for rash.  Neurological: Negative for dizziness, seizures, weakness, numbness and headaches.    Allergies  Aspirin  Home Medications   Current Outpatient Rx  Name Route Sig Dispense Refill  . ENALAPRIL MALEATE 20 MG PO TABS Oral Take 20 mg by mouth daily.    Marland Kitchen HYDRALAZINE HCL 10 MG PO TABS Oral Take 10 mg by mouth as needed. For itching    . INSULIN GLARGINE 100 UNIT/ML [place] SOLN Subcutaneous Inject 46 Units into the skin at bedtime.     Marland Kitchen  METFORMIN HCL 1000 MG PO TABS Oral Take 1,000 mg by mouth daily with breakfast.     . PANTOPRAZOLE SODIUM 40 MG PO TBEC Oral Take 40 mg by mouth at bedtime.     Marland Kitchen SALINE NASAL SPRAY 0.65 % NA SOLN Nasal Place 1 spray into the nose as needed. For stuffy nose    . TRIAMCINOLONE ACETONIDE 0.1 % EX OINT Topical Apply 1 application topically 3 (three) times a week.    Marland Kitchen VITAMIN B-12 1000 MCG PO TABS Oral Take 1,000 mcg by mouth daily with breakfast.     . VITAMIN D (ERGOCALCIFEROL) 50000 UNITS PO CAPS Oral Take 50,000 Units by mouth every 7 (seven) days. On tuesday       BP 165/53  Pulse 70  Temp 98.1 F (36.7 C) (Oral)  Resp 18  SpO2 99%  Physical Exam  Nursing note and vitals reviewed. Constitutional: She is oriented to person, place, and time. She appears well-developed and well-nourished. No distress.  HENT:  Head: Normocephalic and atraumatic.  Right Ear: External ear normal.  Left Ear: External ear normal.  Mouth/Throat: Oropharynx is clear and moist. No oropharyngeal exudate.  Eyes: Conjunctivae are normal. No scleral icterus.  Neck: Neck supple. No JVD present. No thyromegaly present.  Cardiovascular: Normal rate, regular rhythm, normal heart sounds and intact distal pulses.  Exam reveals no gallop and no friction rub.   Pulmonary/Chest: Effort normal and breath sounds normal. No respiratory distress. She has no wheezes. She has no rales. She exhibits no tenderness.  Abdominal: Soft. Bowel sounds are normal. She exhibits no distension and no mass. There is no tenderness. There is no rebound and no guarding.  Genitourinary: Guaiac positive stool.  Lymphadenopathy:    She has no cervical adenopathy.  Neurological: She is alert and oriented to person, place, and time.  Skin: No rash noted. She is not diaphoretic.       Chronic hyperpigmented patches in lower extremities from lichen planus.     ED Course  Procedures (including critical care time)  Labs Reviewed  POCT I-STAT, CHEM 8 - Abnormal; Notable for the following:    Glucose, Bld 249 (*)     Hemoglobin 10.2 (*)     HCT 30.0 (*)     All other components within normal limits  CBC - Abnormal; Notable for the following:    RBC 3.49 (*)     Hemoglobin 9.9 (*)     HCT 29.8 (*)     RDW 15.8 (*)     All other components within normal limits  OCCULT BLOOD, POC DEVICE  LAB REPORT - SCANNED   No results found.   1. Fatigue   2. Chronic anemia   3. Chronic GI bleeding     EKG: NSR ventricular rate 68 bpm. no acute ischemic changes.  MDM  71 y/o female with chronic  fatigue. Clinically well. Known GI AVM. Here with positive guaic. Stable electrolytes and  hemoglobin 9.9 by (CBC) was 9.1 in June 6. Likely chronic GI bleeding from AVM. No acute anemia to prompt transfer to the hospital for transfusion etc. Asked patient to follow up with her PCP or GI specialist if persistent symptoms.          Randa Spike, MD 09/21/11 0150

## 2011-10-16 ENCOUNTER — Other Ambulatory Visit: Payer: Self-pay | Admitting: *Deleted

## 2011-10-20 ENCOUNTER — Telehealth: Payer: Self-pay | Admitting: Hematology and Oncology

## 2011-10-20 NOTE — Telephone Encounter (Signed)
lmonvm for pt re new time for appt on 9/25 @ 1:30 pm. Also confirmed 9/23 appt. Per 9/6 pof appt moved from [facility] to [facility]. New schedule mailed.

## 2011-10-28 ENCOUNTER — Encounter (HOSPITAL_COMMUNITY): Payer: Self-pay

## 2011-10-28 ENCOUNTER — Telehealth: Payer: Self-pay

## 2011-10-28 ENCOUNTER — Encounter (HOSPITAL_COMMUNITY): Payer: Self-pay | Admitting: *Deleted

## 2011-10-28 ENCOUNTER — Inpatient Hospital Stay (HOSPITAL_COMMUNITY)
Admission: EM | Admit: 2011-10-28 | Discharge: 2011-10-30 | DRG: 812 | Disposition: A | Discharge status: Home / Self Care | Payer: Medicare Other | Attending: Internal Medicine | Admitting: Internal Medicine

## 2011-10-28 ENCOUNTER — Emergency Department (HOSPITAL_COMMUNITY)
Admission: EM | Admit: 2011-10-28 | Discharge: 2011-10-28 | Disposition: A | Discharge status: Nursing Home (Includes ICF) | Payer: Medicare Other | Source: Home / Self Care | Attending: Emergency Medicine | Admitting: Emergency Medicine

## 2011-10-28 DIAGNOSIS — Q279 Congenital malformation of peripheral vascular system, unspecified: Secondary | ICD-10-CM

## 2011-10-28 DIAGNOSIS — D649 Anemia, unspecified: Secondary | ICD-10-CM

## 2011-10-28 DIAGNOSIS — R5383 Other fatigue: Secondary | ICD-10-CM

## 2011-10-28 DIAGNOSIS — Z7982 Long term (current) use of aspirin: Secondary | ICD-10-CM

## 2011-10-28 DIAGNOSIS — D500 Iron deficiency anemia secondary to blood loss (chronic): Principal | ICD-10-CM | POA: Diagnosis present

## 2011-10-28 DIAGNOSIS — R5381 Other malaise: Secondary | ICD-10-CM

## 2011-10-28 DIAGNOSIS — I780 Hereditary hemorrhagic telangiectasia: Secondary | ICD-10-CM | POA: Diagnosis present

## 2011-10-28 DIAGNOSIS — E876 Hypokalemia: Secondary | ICD-10-CM | POA: Diagnosis present

## 2011-10-28 DIAGNOSIS — R531 Weakness: Secondary | ICD-10-CM

## 2011-10-28 DIAGNOSIS — Q2733 Arteriovenous malformation of digestive system vessel: Secondary | ICD-10-CM

## 2011-10-28 DIAGNOSIS — L439 Lichen planus, unspecified: Secondary | ICD-10-CM | POA: Diagnosis present

## 2011-10-28 DIAGNOSIS — D509 Iron deficiency anemia, unspecified: Secondary | ICD-10-CM | POA: Diagnosis present

## 2011-10-28 DIAGNOSIS — K31819 Angiodysplasia of stomach and duodenum without bleeding: Secondary | ICD-10-CM

## 2011-10-28 DIAGNOSIS — E118 Type 2 diabetes mellitus with unspecified complications: Secondary | ICD-10-CM

## 2011-10-28 DIAGNOSIS — E119 Type 2 diabetes mellitus without complications: Secondary | ICD-10-CM | POA: Diagnosis present

## 2011-10-28 DIAGNOSIS — I10 Essential (primary) hypertension: Secondary | ICD-10-CM

## 2011-10-28 DIAGNOSIS — K219 Gastro-esophageal reflux disease without esophagitis: Secondary | ICD-10-CM | POA: Diagnosis present

## 2011-10-28 DIAGNOSIS — K922 Gastrointestinal hemorrhage, unspecified: Secondary | ICD-10-CM

## 2011-10-28 LAB — CBC WITH DIFFERENTIAL/PLATELET
Basophils Absolute: 0 10*3/uL (ref 0.0–0.1)
HCT: 21.2 % — ABNORMAL LOW (ref 36.0–46.0)
Lymphocytes Relative: 12 % (ref 12–46)
Monocytes Relative: 10 % (ref 3–12)
Neutro Abs: 4 10*3/uL (ref 1.7–7.7)
Platelets: 162 10*3/uL (ref 150–400)
RDW: 19.8 % — ABNORMAL HIGH (ref 11.5–15.5)
WBC: 5.5 10*3/uL (ref 4.0–10.5)

## 2011-10-28 LAB — PROTIME-INR
INR: 1.21 (ref 0.00–1.49)
Prothrombin Time: 15.1 seconds (ref 11.6–15.2)

## 2011-10-28 LAB — URINALYSIS, ROUTINE W REFLEX MICROSCOPIC
Bilirubin Urine: NEGATIVE
Hgb urine dipstick: NEGATIVE
Ketones, ur: NEGATIVE mg/dL
Nitrite: NEGATIVE
Urobilinogen, UA: 0.2 mg/dL (ref 0.0–1.0)
pH: 6.5 (ref 5.0–8.0)

## 2011-10-28 LAB — PREPARE RBC (CROSSMATCH)

## 2011-10-28 LAB — POCT I-STAT, CHEM 8
HCT: 21 % — ABNORMAL LOW (ref 36.0–46.0)
Hemoglobin: 7.1 g/dL — ABNORMAL LOW (ref 12.0–15.0)
Potassium: 4.2 mEq/L (ref 3.5–5.1)
Sodium: 141 mEq/L (ref 135–145)

## 2011-10-28 LAB — OCCULT BLOOD, POC DEVICE: Fecal Occult Bld: POSITIVE

## 2011-10-28 MED ORDER — SODIUM CHLORIDE 0.9 % IV SOLN: 250.0000 mL | INTRAVENOUS | Status: DC | PRN

## 2011-10-28 MED ORDER — INSULIN ASPART 100 UNIT/ML ~~LOC~~ SOLN
0.0000 [IU] | Freq: Three times a day (TID) | SUBCUTANEOUS | Status: DC
  Administered 2011-10-29: 5 [IU] via SUBCUTANEOUS
  Administered 2011-10-29: 3 [IU] via SUBCUTANEOUS
  Administered 2011-10-30: 2 [IU] via SUBCUTANEOUS

## 2011-10-28 MED ORDER — VITAMIN B-12 1000 MCG PO TABS
1000.0000 ug | ORAL_TABLET | Freq: Every day | ORAL | Status: DC
  Administered 2011-10-29 – 2011-10-30 (×2): 1000 ug via ORAL
  Filled 2011-10-28 (×4): qty 1

## 2011-10-28 MED ORDER — INSULIN GLARGINE 100 UNIT/ML ~~LOC~~ SOLN
46.0000 [IU] | Freq: Every day | SUBCUTANEOUS | Status: DC
  Administered 2011-10-29: 46 [IU] via SUBCUTANEOUS

## 2011-10-28 MED ORDER — VITAMIN D (ERGOCALCIFEROL) 1.25 MG (50000 UNIT) PO CAPS
50000.0000 [IU] | ORAL_CAPSULE | ORAL | Status: DC
  Administered 2011-10-28: 50000 [IU] via ORAL
  Filled 2011-10-28: qty 1

## 2011-10-28 MED ORDER — OXYCODONE HCL 5 MG PO TABS: 5.0000 mg | ORAL_TABLET | ORAL | Status: DC | PRN

## 2011-10-28 MED ORDER — MORPHINE SULFATE 2 MG/ML IJ SOLN: 2.0000 mg | INTRAMUSCULAR | Status: DC | PRN

## 2011-10-28 MED ORDER — ENALAPRIL MALEATE 20 MG PO TABS
20.0000 mg | ORAL_TABLET | Freq: Every day | ORAL | Status: DC
  Administered 2011-10-28 – 2011-10-30 (×3): 20 mg via ORAL
  Filled 2011-10-28 (×3): qty 1

## 2011-10-28 MED ORDER — ACETAMINOPHEN 325 MG PO TABS: 650.0000 mg | ORAL_TABLET | Freq: Four times a day (QID) | ORAL | Status: DC | PRN

## 2011-10-28 MED ORDER — SODIUM CHLORIDE 0.9 % IJ SOLN: 3.0000 mL | INTRAMUSCULAR | Status: DC | PRN

## 2011-10-28 MED ORDER — PANTOPRAZOLE SODIUM 40 MG PO TBEC
40.0000 mg | DELAYED_RELEASE_TABLET | Freq: Every day | ORAL | Status: DC
  Administered 2011-10-29 (×2): 40 mg via ORAL
  Filled 2011-10-28 (×2): qty 1

## 2011-10-28 MED ORDER — SODIUM CHLORIDE 0.9 % IJ SOLN
3.0000 mL | Freq: Two times a day (BID) | INTRAMUSCULAR | Status: DC
  Administered 2011-10-29: 3 mL via INTRAVENOUS

## 2011-10-28 MED ORDER — ACETAMINOPHEN 650 MG RE SUPP: 650.0000 mg | Freq: Four times a day (QID) | RECTAL | Status: DC | PRN

## 2011-10-28 MED ORDER — HYDRALAZINE HCL 10 MG PO TABS
10.0000 mg | ORAL_TABLET | ORAL | Status: DC
  Administered 2011-10-28 – 2011-10-30 (×2): 10 mg via ORAL
  Filled 2011-10-28 (×2): qty 1

## 2011-10-28 MED ORDER — SODIUM CHLORIDE 0.9 % IJ SOLN
3.0000 mL | Freq: Two times a day (BID) | INTRAMUSCULAR | Status: DC
  Administered 2011-10-30: 3 mL via INTRAVENOUS

## 2011-10-28 MED ORDER — SALINE SPRAY 0.65 % NA SOLN
1.0000 | Freq: Every day | NASAL | Status: DC
  Administered 2011-10-28 – 2011-10-30 (×3): 1 via NASAL
  Filled 2011-10-28: qty 44

## 2011-10-28 NOTE — Telephone Encounter (Signed)
Called pt to see if Dr Jeanie Cooks helped her with low hgb from 9/13. Pt stated she was in the ER. Dr Jamse Arn made aware.

## 2011-10-28 NOTE — H&P (Signed)
PCP:   Philis Fendt, MD   Chief Complaint:  Progressive weakness and shortness of breath.   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 She follows up regularly with Dr Jamse Arn, hematologist, for iron deficiency anemia. On 10/23/11, patient was seen by PM, for fatigue, and had blood drawn at that time. She was contacted by Dr Avbuere's office via text on 10/26/11, with a message that her blood count was low. Since then, she has had progressive weakness, fatigue and occasional SOBOE. No chest pain or LE swelling. On 10/28/11, she went to [facility] for above symptoms, found to be anemic, and referred to the ED at [facility], where her hemoglobin was found to be 7.1. She has no abdominal pain, epistaxis, or melena.    Allergies:   Allergies  Allergen Reactions  . Aspirin Nausea Only      Past Medical History  Diagnosis Date  . Anemia   . S/P endoscopy     with laser treatment  08/19/2006  . History of epistaxis   . Diabetes mellitus     insulin requiring.  Marland Kitchen Hypertension   . Hyperlipidemia   . Telangiectasia     Gastric   . Lichen planus     Both lower extremities  . GERD (gastroesophageal reflux disease)   . Heart murmur     Past Surgical History  Procedure Date  . Nose surgery   . Savory dilation 02/26/2011    Procedure: SAVORY DILATION;  Surgeon: [name], MD;  Location: [name];  Service: Endoscopy;  Laterality: N/A;  c-arm needed  . Esophagogastroduodenoscopy 02/26/2011    Procedure: ESOPHAGOGASTRODUODENOSCOPY (EGD);  Surgeon:  [name], MD;  Location: [name];  Service: Endoscopy;  Laterality: N/A;    Prior to Admission medications   Medication Sig Start Date End Date Taking? Authorizing Provider  enalapril (VASOTEC) 20 MG tablet Take 20 mg by mouth daily.   Yes Historical Provider, MD  hydrALAZINE (APRESOLINE) 10 MG tablet Take 10 mg by mouth every other day.    Yes Historical Provider, MD  insulin glargine (LANTUS) 100 UNIT/ML injection Inject 46 Units into the skin at bedtime.    Yes Historical Provider, MD  metFORMIN (GLUCOPHAGE) 1000 MG tablet Take 1,000 mg by mouth daily with breakfast.    Yes Historical Provider, MD  pantoprazole (PROTONIX) 40 MG tablet Take 40 mg by mouth at bedtime.    Yes Historical Provider, MD  sodium chloride (OCEAN) 0.65 % nasal spray Place 1 spray into the nose daily.    Yes Historical Provider, MD  vitamin B-12 (CYANOCOBALAMIN) 1000 MCG tablet Take 1,000 mcg by mouth daily with breakfast.    Yes Historical Provider, MD  Vitamin D, Ergocalciferol, (DRISDOL) 50000 UNITS CAPS Take 50,000 Units by mouth every 7 (seven) days. On tuesday   Yes Historical Provider, MD    Social History: Patient reports that she has never smoked. She has never used smokeless tobacco. She reports that she does not drink alcohol or use illicit drugs.  Family History  Problem Relation Age of Onset  . Breast cancer    .  Malignant hyperthermia Neg Hx   . Stroke Father     Review of Systems:  As per HPI and chief complaint. Patent denies diminished appetite, weight loss, fever, chills, headache, blurred vision, difficulty in speaking, dysphagia, chest pain, cough, orthopnea, paroxysmal nocturnal dyspnea, nausea, diaphoresis, abdominal pain, vomiting, diarrhea, belching, heartburn, hematemesis, melena, dysuria, nocturia, urinary frequency, hematochezia, lower extremity swelling, pain, or redness. The rest of the systems review is negative.  Physical Exam:  General:  Patient does not appear to be in  obvious acute distress. Alert, communicative, fully oriented, talking in complete sentences, not short of breath at rest.  HEENT:  Marked clinical pallor, no jaundice, no conjunctival injection or discharge. Hydration status is fair. Telangiectasiae are seen on tongue.  NECK:  Supple, JVP not seen, no carotid bruits, no palpable lymphadenopathy, no palpable goiter. CHEST:  Clinically clear to auscultation, no wheezes, no crackles. HEART:  Sounds 1 and 2 heard, normal, regular, no murmurs. ABDOMEN:  Full, soft, non-tender, no palpable organomegaly, no palpable masses, normal bowel sounds. GENITALIA:  Not examined. LOWER EXTREMITIES:  No pitting edema, palpable peripheral pulses. Lesions of lichen planus are noted on both lower extremities.  MUSCULOSKELETAL SYSTEM:  Generalized osteoarthritic changes, otherwise, normal. CENTRAL NERVOUS SYSTEM:  No focal neurologic deficit on gross examination.  Labs on Admission:  Results for orders placed during the hospital encounter of 10/28/11 (from the past 48 hour(s))  URINALYSIS, ROUTINE W REFLEX MICROSCOPIC     Status: Normal   Collection Time   10/28/11  4:46 PM      Component Value Range Comment   Color, Urine YELLOW  YELLOW    APPearance CLEAR  CLEAR    Specific Gravity, Urine 1.010  1.005 - 1.030    pH 6.5  5.0 - 8.0    Glucose, UA NEGATIVE  NEGATIVE mg/dL    Hgb urine dipstick NEGATIVE  NEGATIVE    Bilirubin Urine NEGATIVE  NEGATIVE    Ketones, ur NEGATIVE  NEGATIVE mg/dL    Protein, ur NEGATIVE  NEGATIVE mg/dL    Urobilinogen, UA 0.2  0.0 - 1.0 mg/dL    Nitrite NEGATIVE  NEGATIVE    Leukocytes, UA NEGATIVE  NEGATIVE MICROSCOPIC NOT DONE ON URINES WITH NEGATIVE PROTEIN, BLOOD, LEUKOCYTES, NITRITE, OR GLUCOSE <1000 mg/dL.  TYPE AND SCREEN     Status: Normal (Preliminary result)   Collection Time   10/28/11  5:00 PM      Component Value Range Comment   ABO/RH(D) A POS      Antibody Screen NEG      Sample Expiration 10/31/2011      Unit  Number [id number]      Blood Component Type RED CELLS,LR      Unit division 00      Status of Unit ISSUED      Transfusion Status OK TO TRANSFUSE      Crossmatch Result Compatible      Unit Number [id number]      Blood Component Type RED CELLS,LR      Unit division 00      Status of Unit ALLOCATED      Transfusion Status OK TO TRANSFUSE      Crossmatch Result Compatible     PREPARE RBC (CROSSMATCH)     Status: Normal   Collection Time   10/28/11  5:00 PM      Component Value Range Comment   Order Confirmation ORDER PROCESSED BY BLOOD BANK     APTT  Status: Normal   Collection Time   10/28/11  5:00 PM      Component Value Range Comment   aPTT 32  24 - 37 seconds   PROTIME-INR     Status: Normal   Collection Time   10/28/11  5:00 PM      Component Value Range Comment   Prothrombin Time 15.1  11.6 - 15.2 seconds    INR 1.21  0.00 - 1.49   POCT I-STAT TROPONIN I     Status: Normal   Collection Time   10/28/11  5:22 PM      Component Value Range Comment   Troponin i, poc 0.00  0.00 - 0.08 ng/mL    Comment 3            OCCULT BLOOD, POC DEVICE     Status: Normal   Collection Time   10/28/11  5:44 PM      Component Value Range Comment   Fecal Occult Bld POSITIVE       Radiological Exams on Admission: No results found.  Assessment/Plan Active Problems:  1. Symptomatic anemia: Patient presents with progressive weakness, fatigue and occasional SOBOE. Hemoglobin was fund to be 7.1 in the ED, against a baseline of 9.9-10.2 on 09/17/11. This is recurrent, and attributable to patient's known AVMs on the basis of chronic GI blood loss, although patient has no clinically discernible active bleeding at this time. We shall transfuse 3-4 units of PRBC. No further work up is indicated at this time. PPI will be continued, and post transfusion hemoglobin, monitored.  2. DM-2: this is insulin-requiring, and appears sub-optimally controlled , based on random blood glucose of 291. We shall  hold Metformin for now, and manage with diet, SSI and Lantus, adjusted as indicated.  3. HTN: BP appears controlled at this time. Will continue pre-admission antihypertensives.  4. GERD: Asymptomatic on PI. 5. Lichen Planus: Not problematic.    Further management will depend on clinical course.   Comment: Patient is Full Code.    Time Spent on Admission: 45 mins.   Mekiah Wahler,CHRISTOPHER 10/28/2011, 7:12 PM

## 2011-10-28 NOTE — ED Notes (Signed)
C/o nausea and weakness x 4 days, saw PCP Friday, he did take blood at that time states hemoglobin was low, still does not feel well

## 2011-10-28 NOTE — ED Notes (Addendum)
Pt reports gradual onset of nausea, weakness, and SOB x3 weeks, pt reports hx of the same and the need for multiple blood transfusion. Pt currently denies nausea and/or pain. Pt is requesting something to eat.

## 2011-10-28 NOTE — ED Provider Notes (Signed)
History     CSN: [id number]  Arrival date & time 10/28/11  14   First MD Initiated Contact with Patient 10/28/11 1547      Chief Complaint  Patient presents with  . Nausea  . Fatigue  . Abnormal Lab    (Consider location/radiation/quality/duration/timing/severity/associated sxs/prior treatment) HPI Pt sent from urgent care for symptomatic anemia. Has had 1 week of decreased energy, mild SOB, and nausea. She has known GI AVM and has required transfusion in the past. No melena, or frank blood per rectum. No fever, chills, abd pain, chest pain, cough.  Past Medical History  Diagnosis Date  . Anemia   . S/P endoscopy     with laser treatment  08/19/2006  . History of epistaxis   . Diabetes mellitus     insulin requiring.  Marland Kitchen Hypertension   . Hyperlipidemia   . Telangiectasia     Gastric   . Lichen planus     Both lower extremities  . GERD (gastroesophageal reflux disease)   . Heart murmur     Past Surgical History  Procedure Date  . Nose surgery   . Savory dilation 02/26/2011    Procedure: SAVORY DILATION;  Surgeon: [name], MD;  Location: [name];  Service: Endoscopy;  Laterality: N/A;  c-arm needed  . Esophagogastroduodenoscopy 02/26/2011    Procedure: ESOPHAGOGASTRODUODENOSCOPY (EGD);  Surgeon: [name], MD;  Location: [name];  Service: Endoscopy;  Laterality: N/A;    Family History  Problem Relation Age of Onset  . Breast cancer    . Malignant hyperthermia Neg Hx   . Stroke Father     History  Substance Use Topics  . Smoking status: Never Smoker   . Smokeless tobacco: Never Used  . Alcohol Use: No    OB History    Grav Para Term Preterm Abortions TAB SAB Ect Mult Living                  Review of Systems  Constitutional: Positive for fatigue. Negative for fever and chills.  Respiratory: Positive for shortness of breath. Negative for cough.   Cardiovascular: Negative for chest pain, palpitations and leg swelling.    Gastrointestinal: Positive for nausea. Negative for vomiting, abdominal pain, diarrhea and blood in stool.  Skin: Negative for rash and wound.  Neurological: Negative for dizziness, light-headedness and numbness.    Allergies  Aspirin  Home Medications   No current outpatient prescriptions on file.  BP 157/66  Pulse 66  Temp 98.3 F (36.8 C) (Oral)  Resp 16  Ht 5\' 3"  (1.6 m)  Wt 146 lb 6.2 oz (66.4 kg)  BMI 25.93 kg/m2  SpO2 98%  Physical Exam  Nursing note and vitals reviewed. Constitutional: She is oriented to person, place, and time. She appears well-developed and well-nourished. No distress.  HENT:  Head: Normocephalic and atraumatic.  Mouth/Throat: Oropharynx is clear and moist.  Eyes: EOM are normal. Pupils are equal, round, and reactive to light.  Neck: Normal range of motion. Neck supple.  Cardiovascular: Normal rate and regular rhythm.   Pulmonary/Chest: Effort normal and breath sounds normal. No respiratory distress. She has no wheezes. She has no rales. She exhibits no tenderness.  Abdominal: Soft. Bowel sounds are normal. She exhibits no mass. There is no tenderness. There is no rebound and no guarding.  Genitourinary: Guaiac positive stool.  Musculoskeletal: Normal range of motion. She exhibits no edema and no tenderness.  Neurological: She is alert and oriented to person,  place, and time.       5/5 motor, sensation intact  Skin: Skin is warm and dry. No rash noted. No erythema.  Psychiatric: She has a normal mood and affect. Her behavior is normal.    ED Course  Procedures (including critical care time)  Labs Reviewed  COMPREHENSIVE METABOLIC PANEL - Abnormal; Notable for the following:    Potassium 3.1 (*)  DELTA CHECK NOTED   Glucose, Bld 57 (*)     Total Bilirubin 1.4 (*)     GFR calc non Af Amer 66 (*)     GFR calc Af Amer 77 (*)     All other components within normal limits  CBC - Abnormal; Notable for the following:    RBC 3.78 (*)      Hemoglobin 8.7 (*)  POST TRANSFUSION SPECIMEN   HCT 28.0 (*)     MCV 74.1 (*)     MCH 23.0 (*)     RDW 19.5 (*)     All other components within normal limits  GLUCOSE, CAPILLARY - Abnormal; Notable for the following:    Glucose-Capillary 191 (*)     All other components within normal limits  GLUCOSE, CAPILLARY - Abnormal; Notable for the following:    Glucose-Capillary 58 (*)     All other components within normal limits  GLUCOSE, CAPILLARY - Abnormal; Notable for the following:    Glucose-Capillary 62 (*)     All other components within normal limits  GLUCOSE, CAPILLARY - Abnormal; Notable for the following:    Glucose-Capillary 128 (*)     All other components within normal limits  GLUCOSE, CAPILLARY - Abnormal; Notable for the following:    Glucose-Capillary 277 (*)     All other components within normal limits  GLUCOSE, CAPILLARY - Abnormal; Notable for the following:    Glucose-Capillary 249 (*)     All other components within normal limits  GLUCOSE, CAPILLARY - Abnormal; Notable for the following:    Glucose-Capillary 166 (*)     All other components within normal limits  TYPE AND SCREEN  PREPARE RBC (CROSSMATCH)  APTT  PROTIME-INR  URINALYSIS, ROUTINE W REFLEX MICROSCOPIC  POCT I-STAT TROPONIN I  OCCULT BLOOD, POC DEVICE  PREPARE RBC (CROSSMATCH)  OCCULT BLOOD X 1 CARD TO LAB, STOOL  BASIC METABOLIC PANEL  CBC   No results found.   1. Generalized weakness   2. Anemia   3. DM (diabetes mellitus), type 2 with complications   4. Essential hypertension, benign       Date: 10/28/2011  Rate:69  Rhythm: normal sinus rhythm  QRS Axis: normal  Intervals: normal  ST/T Wave abnormalities: normal  Conduction Disutrbances:none  Narrative Interpretation:   Old EKG Reviewed: unchanged   MDM  Discussed with [facility] and will see pt in ED and admit       Julianne Rice, MD 10/29/11 2327

## 2011-10-28 NOTE — ED Provider Notes (Signed)
Chief Complaint  Patient presents with  . Nausea    History of Present Illness:   [name] is a 71 year old female one-week history of weakness, fatigue, nausea, and slight shortness of breath. She has a history since 2000 chronic GI blood loss due to a gastric AVM. She also has diabetes. She denies any overt GI bleeding such as hematemesis, blood in the stool, or melena. She denies any abdominal pain. She's not been vomiting but doesn't have much of an appetite. She saw her primary care Dr. 6 days ago and her blood count was low. She did not recall what the number was. He her for outpatient infusion at the cancer center, but this couldn't be done until the 23rd of this month, which is a week away. She had multiple transfusions since 2000 and. She denies any fever, chills, unexplained weight loss, headache, blurry vision, sore throat, adenopathy, coughing, wheezing, chest pain, diarrhea, urinary symptoms, or skin rash.  Review of Systems:  Other than noted above, the patient denies any of the following symptoms. Systemic:  No fever, chills, sweats, fatigue, myalgias, headache, or anorexia. Eye:  No redness, pain or drainage. ENT:  No earache, nasal congestion, rhinorrhea, sinus pressure, or sore throat. Lungs:  No cough, sputum production, wheezing, shortness of breath.  Cardiovascular:  No chest pain, palpitations, or syncope. GI:  No nausea, vomiting, abdominal pain or diarrhea. GU:  No dysuria, frequency, or hematuria. Skin:  No rash or pruritis.  [place]:  Past medical history, family history, social history, meds, and allergies were reviewed.   Physical Exam:   Vital signs:  BP 160/69  Pulse 74  Temp 98.7 F (37.1 C) (Oral)  SpO2 100% General:  Alert, in no distress. Eye:  PERRL, full EOMs.  Lids and conjunctivas were normal. ENT:  TMs and canals were normal, without erythema or inflammation.  Nasal mucosa was clear and uncongested, without drainage.  Mucous membranes were  moist.  Pharynx was clear, without exudate or drainage.  There were no oral ulcerations or lesions. Neck:  Supple, no adenopathy, tenderness or mass. Thyroid was normal. Lungs:  No respiratory distress.  Lungs were clear to auscultation, without wheezes, rales or rhonchi.  Breath sounds were clear and equal bilaterally. Heart:  Regular rhythm, without gallops, or rubs. She does have a grade 3/6 systolic ejection murmur her over the entire precordium. Abdomen:  Soft, flat, and non-tender to palpation.  No hepatosplenomagaly or mass. Skin:  Clear, warm, and dry, without rash or lesions.  Labs:   Results for orders placed during the hospital encounter of 10/28/11  CBC WITH DIFFERENTIAL      Component Value Range   WBC 5.5  4.0 - 10.5 K/uL   RBC 2.96 (*) 3.87 - 5.11 MIL/uL   Hemoglobin 6.0 (*) 12.0 - 15.0 g/dL   HCT 21.2 (*) 36.0 - 46.0 %   MCV 71.6 (*) 78.0 - 100.0 fL   MCH 20.3 (*) 26.0 - 34.0 pg   MCHC 28.3 (*) 30.0 - 36.0 g/dL   RDW 19.8 (*) 11.5 - 15.5 %   Platelets PENDING  150 - 400 K/uL   Neutrophils Relative PENDING  43 - 77 %   Neutro Abs PENDING  1.7 - 7.7 K/uL   Band Neutrophils PENDING  0 - 10 %   Lymphocytes Relative PENDING  12 - 46 %   Lymphs Abs PENDING  0.7 - 4.0 K/uL   Monocytes Relative PENDING  3 - 12 %  Monocytes Absolute PENDING  0.1 - 1.0 K/uL   Eosinophils Relative PENDING  0 - 5 %   Eosinophils Absolute PENDING  0.0 - 0.7 K/uL   Basophils Relative PENDING  0 - 1 %   Basophils Absolute PENDING  0.0 - 0.1 K/uL   WBC Morphology PENDING     RBC Morphology PENDING     Smear Review PENDING     nRBC PENDING  0 /100 WBC   Metamyelocytes Relative PENDING     Myelocytes PENDING     Promyelocytes Absolute PENDING     Blasts PENDING    POCT I-STAT, CHEM 8      Component Value Range   Sodium 141  135 - 145 mEq/L   Potassium 4.2  3.5 - 5.1 mEq/L   Chloride 105  96 - 112 mEq/L   BUN 14  6 - 23 mg/dL   Creatinine, Ser 1.00  0.50 - 1.10 mg/dL   Glucose, Bld 291 (*)  70 - 99 mg/dL   Calcium, Ion 1.27  1.13 - 1.30 mmol/L   TCO2 25  0 - 100 mmol/L   Hemoglobin 7.1 (*) 12.0 - 15.0 g/dL   HCT 21.0 (*) 36.0 - 46.0 %    Assessment:  The primary encounter diagnosis was Gastric AVM. Diagnoses of GI bleed, Anemia, and Generalized weakness were also pertinent to this visit.  She has a symptomatic, severe anemia and is in need of a blood transfusion.  Plan:   1.  The following meds were prescribed:   New Prescriptions   No medications on file   2.  The patient was transported to the emergency department via shuttle for blood transfusion.  Harden Mo, MD 10/28/11 1350

## 2011-10-28 NOTE — ED Notes (Signed)
Pt seen at Urgent Care for nausea, weakness, and short of breath.  Sent here for hemoglobin of 6.0.  Pt with hx of multiple transfusions for same.

## 2011-10-29 DIAGNOSIS — E118 Type 2 diabetes mellitus with unspecified complications: Secondary | ICD-10-CM

## 2011-10-29 DIAGNOSIS — I10 Essential (primary) hypertension: Secondary | ICD-10-CM

## 2011-10-29 DIAGNOSIS — D649 Anemia, unspecified: Secondary | ICD-10-CM

## 2011-10-29 LAB — GLUCOSE, CAPILLARY
Glucose-Capillary: 191 mg/dL — ABNORMAL HIGH (ref 70–99)
Glucose-Capillary: 58 mg/dL — ABNORMAL LOW (ref 70–99)
Glucose-Capillary: 62 mg/dL — ABNORMAL LOW (ref 70–99)

## 2011-10-29 LAB — COMPREHENSIVE METABOLIC PANEL
Alkaline Phosphatase: 89 U/L (ref 39–117)
BUN: 12 mg/dL (ref 6–23)
CO2: 30 mEq/L (ref 19–32)
Calcium: 9.9 mg/dL (ref 8.4–10.5)
GFR calc Af Amer: 77 mL/min — ABNORMAL LOW (ref >90)
GFR calc non Af Amer: 66 mL/min — ABNORMAL LOW (ref >90)
Glucose, Bld: 57 mg/dL — ABNORMAL LOW (ref 70–99)
Potassium: 3.1 mEq/L — ABNORMAL LOW (ref 3.5–5.1)
Total Protein: 6.4 g/dL (ref 6.0–8.3)

## 2011-10-29 LAB — CBC
HCT: 28 % — ABNORMAL LOW (ref 36.0–46.0)
RDW: 19.5 % — ABNORMAL HIGH (ref 11.5–15.5)
WBC: 7.4 10*3/uL (ref 4.0–10.5)

## 2011-10-29 MED ORDER — INSULIN GLARGINE 100 UNIT/ML ~~LOC~~ SOLN
40.0000 [IU] | Freq: Every day | SUBCUTANEOUS | Status: DC
  Administered 2011-10-29: 40 [IU] via SUBCUTANEOUS

## 2011-10-29 MED ORDER — POTASSIUM CHLORIDE CRYS ER 20 MEQ PO TBCR
40.0000 meq | EXTENDED_RELEASE_TABLET | Freq: Four times a day (QID) | ORAL | Status: AC
  Administered 2011-10-29 (×2): 40 meq via ORAL
  Filled 2011-10-29 (×2): qty 2

## 2011-10-29 NOTE — Plan of Care (Signed)
Problem: Phase I Progression Outcomes Goal: Initial discharge plan identified Outcome: Completed/Met Date Met:  10/29/11 To return home with husband

## 2011-10-29 NOTE — Progress Notes (Signed)
Inpatient Diabetes Program Recommendations  AACE/ADA: New Consensus Statement on Inpatient Glycemic Control (2013)  Target Ranges:  Prepandial:   less than 140 mg/dL      Peak postprandial:   less than 180 mg/dL (1-2 hours)      Critically ill patients:  140 - 180 mg/dL   Reason for Visit: CBGs 10/28/11   191-57/62 mg/dl  Inpatient Diabetes Program Recommendations Insulin - Basal: Decrease Lantus to 40 units daily.  Note: If CBGs continue to run below 70mg /dl, decrease Lantus dosage.

## 2011-10-29 NOTE — Progress Notes (Signed)
Hypoglycemic Event  CBG: 58  Treatment: 15 GM carbohydrate snack  Symptoms: Nervous/irritable  Follow-up CBG: Time:0850 CBG Result:62  Possible Reasons for Event: Inadequate meal intake  Comments/MD notified: Dr. Hartford Poli notified of both results, stated to let her eat breakfast and re- check.     [name], [name]  Remember to initiate Hypoglycemia Order Set & complete

## 2011-10-29 NOTE — Progress Notes (Signed)
[facility] PROGRESS NOTE  [name] [id number] DOB: 30-Sep-1940 DOA: 10/28/2011 PCP: Philis Fendt, MD  Assessment/Plan: Active Problems:  * No active hospital problems. 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 -Status post transfusion of 2 units, hemoglobin came back from 6.0 up to 8.7. -I will transfuse a third unit of packed RBC, I will target a hemoglobin of more than 9 on discharge.   Diabetes mellitus type 2 -This is insulin requiring, appears to be suboptimally controlled her random blood glucose of 291.  -She developed hypoglycemia this morning with blood sugar of 57. -I will decrease the Lantus to 40 units, continue on the metformin.  Hypertension -Blood pressure appear to be control at this time, we'll continue preadmission antihypertensive meds.  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 She follows up regularly with Dr Jamse Arn, hematologist, for iron deficiency anemia. On 10/23/11, patient was seen by PM, for fatigue, and had blood drawn at that time. She was contacted by Dr Avbuere's office via text on 10/26/11, with a message that her blood count was low. Since then, she has had progressive weakness, fatigue and occasional SOBOE. No chest pain or LE swelling. On 10/28/11, she went to [facility] for  above symptoms, found to be anemic, and referred to the ED at [facility], where her hemoglobin was found to be 7.1. She has no abdominal pain, epistaxis, or melena.   Consultants:  None  Procedures:  Blood transfusion x3 units  Antibiotics:  None  HPI/Subjective: Had episode of hypoglycemia this morning with blood sugar of 57.  Objective: Filed Vitals:   10/29/11 0242 10/29/11 0458 10/29/11 0900 10/29/11 1037  BP: 137/63 130/55 132/56 127/80  Pulse: 63 65 63 65  Temp: 98.5 F (36.9 C) 98.2 F (36.8 C) 98.5 F (36.9 C) 98.6 F (37 C)  TempSrc: Oral Oral Oral Oral  Resp: 18 16 16 16   Height:      Weight:      SpO2: 100% 99%  99%    Intake/Output Summary (Last 24 hours) at 10/29/11 1347 Last data filed at 10/29/11 0242  Gross per 24 hour  Intake 705.83 ml  Output      0 ml  Net 705.83 ml   Filed Weights   10/28/11 2156  Weight: 66.4 kg (146 lb 6.2 oz)    Exam:  General: Alert and awake, oriented x3, not in any acute distress. HEENT: anicteric sclera, pupils reactive to light and accommodation, EOMI CVS: S1-S2 clear, no murmur rubs or gallops Chest: clear to auscultation bilaterally, no wheezing, rales or rhonchi Abdomen: soft nontender, nondistended, normal bowel sounds, no organomegaly Extremities: no cyanosis, clubbing or edema noted bilaterally Neuro: Cranial nerves II-XII intact, no focal neurological deficits  Data Reviewed: Basic Metabolic Panel:  Lab [id number] 0650 10/28/11 1314  NA 143 141  K 3.1* 4.2  CL 107 105  CO2 30 --  GLUCOSE 57* 291*  BUN 12 14  CREATININE 0.86 1.00  CALCIUM 9.9 --  MG -- --  PHOS -- --   Liver Function Tests:  Lab 10/29/11 0650  AST 19  ALT 15  ALKPHOS 89  BILITOT 1.4*  PROT 6.4  ALBUMIN 3.6   No results found for this basename: LIPASE:5,AMYLASE:5 in the last 168 hours No results found for this basename: AMMONIA:5 in the last 168 hours CBC:  Lab 10/29/11 0650 10/28/11 1314 10/28/11 1302  WBC 7.4 -- 5.5    NEUTROABS -- -- 4.0  HGB 8.7* 7.1* 6.0*  HCT 28.0* 21.0* 21.2*  MCV 74.1* -- 71.6*  PLT 178 -- 162   Cardiac Enzymes: No results found for this basename: CKTOTAL:5,CKMB:5,CKMBINDEX:5,TROPONINI:5 in the last 168 hours BNP (last 3 results) No results found for this basename: PROBNP:3 in the last 8760 hours CBG:  Lab 10/29/11 1218 10/29/11 0923 10/29/11 0851 10/29/11 0819 10/29/11 0013  GLUCAP 277* 128* 62* 58* 191*    No results found for this or any previous visit (from the past 240 hour(s)).   Studies: No results found.  Scheduled Meds:   . enalapril  20 mg Oral Daily  . hydrALAZINE  10 mg Oral QODAY  . insulin aspart  0-9 Units Subcutaneous TID WC  . insulin glargine  40 Units Subcutaneous QHS  . pantoprazole  40 mg Oral QHS  . sodium chloride  1 spray Nasal Daily  . sodium chloride  3 mL Intravenous Q12H  . sodium chloride  3 mL Intravenous Q12H  . vitamin B-12  1,000 mcg Oral Q breakfast  . Vitamin D (Ergocalciferol)  50,000 Units Oral Q7 days  . DISCONTD: insulin glargine  46 Units Subcutaneous QHS   Continuous Infusions:   Active Problems:  * No active hospital problems. *     Time spent: 35 minutes    [facility] Pager [phone]. If 8PM-8AM, please contact night-coverage at [email or web address], password [facility] 10/29/2011, 1:47 PM  LOS: 1 day

## 2011-10-30 ENCOUNTER — Other Ambulatory Visit: Payer: Self-pay | Admitting: *Deleted

## 2011-10-30 DIAGNOSIS — D509 Iron deficiency anemia, unspecified: Secondary | ICD-10-CM

## 2011-10-30 DIAGNOSIS — Q2733 Arteriovenous malformation of digestive system vessel: Secondary | ICD-10-CM

## 2011-10-30 LAB — BASIC METABOLIC PANEL
Chloride: 110 mEq/L (ref 96–112)
Creatinine, Ser: 0.9 mg/dL (ref 0.50–1.10)
GFR calc Af Amer: 73 mL/min — ABNORMAL LOW (ref >90)
Potassium: 4.3 mEq/L (ref 3.5–5.1)

## 2011-10-30 LAB — TYPE AND SCREEN
ABO/RH(D): A POS
Antibody Screen: NEGATIVE

## 2011-10-30 LAB — CBC
HCT: 32.3 % — ABNORMAL LOW (ref 36.0–46.0)
RDW: 19.8 % — ABNORMAL HIGH (ref 11.5–15.5)
WBC: 6.5 10*3/uL (ref 4.0–10.5)

## 2011-10-30 LAB — GLUCOSE, CAPILLARY: Glucose-Capillary: 110 mg/dL — ABNORMAL HIGH (ref 70–99)

## 2011-10-30 MED ORDER — INSULIN GLARGINE 100 UNIT/ML ~~LOC~~ SOLN: 40.0000 [IU] | Freq: Every day | SUBCUTANEOUS | Status: DC

## 2011-10-30 NOTE — Progress Notes (Signed)
[facility] discharged Home per MD order.  Discharge instructions reviewed and discussed with the patient, all questions and concerns answered. Explained to pt. How to sign up for Mychart.  Copy of instructions and scripts given to patient.   Tynika, St[place]  Home Medication Instructions [id number]   Printed on:10/30/11 1245  Medication Information                    metFORMIN (GLUCOPHAGE) 1000 MG tablet Take 1,000 mg by mouth daily with breakfast.            vitamin B-12 (CYANOCOBALAMIN) 1000 MCG tablet Take 1,000 mcg by mouth daily with breakfast.            pantoprazole (PROTONIX) 40 MG tablet Take 40 mg by mouth at bedtime.            sodium chloride (OCEAN) 0.65 % nasal spray Place 1 spray into the nose daily.            Vitamin D, Ergocalciferol, (DRISDOL) 50000 UNITS CAPS Take 50,000 Units by mouth every 7 (seven) days. On tuesday           enalapril (VASOTEC) 20 MG tablet Take 20 mg by mouth daily.           insulin glargine (LANTUS) 100 UNIT/ML injection Inject 40 Units into the skin at bedtime.             Patients skin is clean, dry and intact, no evidence of skin break down.   Patient will be escorted to car by NT in a wheelchair,  Around 1:30 when husband arrives no distress noted upon discharge.  Wynetta Emery, Junko Ohagan C 10/30/2011 12:45 PM

## 2011-10-30 NOTE — Discharge Summary (Signed)
Physician Discharge Summary  [name] [id number] DOB: [birth year] DOA: 10/28/2011  PCP: Philis Fendt, MD  Admit date: 10/28/2011 Discharge date: 10/30/2011  Recommendations for Outpatient Follow-up:  1. 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 She follows up regularly with Dr Jamse Arn, hematologist, for iron deficiency anemia. On 10/23/11, patient was seen by PM, for fatigue, and had blood drawn at that time. She was contacted by Dr Avbuere's office via text on 10/26/11, with a message that her blood count was low. Since then, she has had progressive weakness, fatigue and occasional SOBOE. No chest pain or LE swelling. On 10/28/11, she went to [facility] for above symptoms, found to be anemic, and referred to the ED at [facility], where her hemoglobin was found to be 7.1. She has no abdominal pain, epistaxis, or melena.    Hospital Course:    1. Chronic anemia: She has microcytic iron deficiency anemia secondary to chronic GI blood loss. The chronic blood loss is likely secondary to Osler-Weber-Rendu syndrome. She had previous extensive workup with multiple EGDs in laser alteration  of gastric AVMs done by Dr. Teena Irani of medial gastroenterology. At the time of admission hemoglobin is 6.0, went up to 8.7 with 2 units of packed RBCs showing transfuse a third unit. Her hemoglobin today is 10.1. Patient to followup with Dr.Odoguw in the regional [place], she's been getting IV iron every 3 months. Please consider also to check her CBCs every other week for every month at least, because she can have these blood transfusion done as outpatient.  2. Diabetes mellitus type 2: This is insulin requiring. The patient appears to be suboptimally controlled, her random blood glucose in the hospital was 291, she developed hypoglycemia in the hospital with blood sugar of 57, her Lantus decreased to 40 units in her metformin was continued throughout the hospital stay.  3. Hypertension: Blood pressure appears to be controlled in the hospital,patient is on enalapril 20 mg daily. Patient also is on hydralazine 10 mg every other day and she mentioned that his for itching. I discontinue this medication.  4. Lichen planus: This is a chronic and stable condition.  Procedures:  Transfusion of 3 units of packed RBCs  Consultations:  None  Discharge Exam: Filed Vitals:   10/29/11 2051 10/29/11 2135 10/30/11 0504 10/30/11 1007  BP: 174/71 157/66 144/65 151/52  Pulse: 66  60   Temp: 98.3 F (36.8 C)  98.6 F (37 C)   TempSrc: Oral  Oral   Resp: 16  16   Height:      Weight:      SpO2: 98%  97%    General: Alert and awake, oriented x3, not in any acute distress. HEENT: anicteric sclera, pupils reactive to  light and accommodation, EOMI CVS: S1-S2 clear, no murmur rubs or gallops Chest: clear to auscultation bilaterally, no wheezing, rales or rhonchi Abdomen: soft nontender, nondistended, normal bowel sounds, no organomegaly Extremities: no cyanosis, clubbing or edema noted bilaterally Neuro: Cranial nerves II-XII intact, no focal neurological deficits  Discharge Instructions        Discharge Orders    Future Appointments: Provider: Department: Dept Phone: Center:   11/02/2011 8:00 AM [facility] [phone] None   11/04/2011 1:30 PM Vivien Rota, NP [facility] [phone] None   11/04/2011 2:45 PM [facility] [phone] None     Future Orders Please Complete By Expires   Diet - low sodium heart healthy      Increase activity slowly          Medication List     As of 10/30/2011 10:54 AM    STOP taking these medications         hydrALAZINE 10 MG tablet   Commonly known as: APRESOLINE      TAKE these medications         enalapril 20 MG tablet   Commonly known as: VASOTEC   Take 20 mg by mouth daily.      insulin glargine 100 UNIT/ML injection   Commonly known as: LANTUS   Inject 40 Units into the skin at bedtime.      metFORMIN 1000 MG tablet   Commonly known as: GLUCOPHAGE   Take 1,000 mg by mouth daily with breakfast.      pantoprazole 40 MG tablet   Commonly known as: PROTONIX   Take 40 mg by mouth at bedtime.      sodium chloride 0.65 % nasal spray   Commonly known as: OCEAN   Place 1 spray into the nose daily.      vitamin B-12 1000 MCG tablet   Commonly known as: CYANOCOBALAMIN   Take 1,000 mcg by mouth daily with breakfast.      Vitamin D (Ergocalciferol) 50000 UNITS Caps   Commonly known as: DRISDOL   Take 50,000 Units by mouth every 7 (seven) days. On tuesday        Follow-up Information    Follow up with AVBUERE,EDWIN A, MD. In 1 week.   Contact information:   [address] [place] [address] [phone]       Follow up with [facility]. On 11/02/2011.   Contact information:   [address] [place] [address] [phone]      Follow up with Sophronia Simas I., MD. On 11/04/2011.   Contact information:   [address] [place] [address] [phone]           The results of significant diagnostics from this  hospitalization (including imaging, microbiology, ancillary and laboratory) are listed below for reference.    Significant Diagnostic Studies: No results found.  Microbiology: No results found for this or any previous visit (from the past 240 hour(s)).   Labs: Basic Metabolic Panel:  Lab [id number] 0640 10/29/11 0650 10/28/11 1314  NA 145 143 141  K 4.3 3.1* 4.2  CL 110 107 105  CO2 26 30 --  GLUCOSE 83 57* 291*  BUN 13 12 14   CREATININE 0.90 0.86 1.00  CALCIUM 9.8 9.9 --  MG -- -- --  PHOS -- -- --   Liver Function Tests:  Lab 10/29/11 0650  AST 19  ALT 15  ALKPHOS 89  BILITOT 1.4*  PROT  6.4  ALBUMIN 3.6   No results found for this basename: LIPASE:5,AMYLASE:5 in the last 168 hours No results found for this basename: AMMONIA:5 in the last 168 hours CBC:  Lab 10/30/11 0640 10/29/11 0650 10/28/11 1314 10/28/11 1302  WBC 6.5 7.4 -- 5.5  NEUTROABS -- -- -- 4.0  HGB 10.1* 8.7* 7.1* 6.0*  HCT 32.3* 28.0* 21.0* 21.2*  MCV 76.2* 74.1* -- 71.6*  PLT 155 178 -- 162   Cardiac Enzymes: No results found for this basename: CKTOTAL:5,CKMB:5,CKMBINDEX:5,TROPONINI:5 in the last 168 hours BNP: BNP (last 3 results) No results found for this basename: PROBNP:3 in the last 8760 hours CBG:  Lab 10/30/11 0824 10/30/11 0731 10/29/11 2140 10/29/11 1717 10/29/11 1218  GLUCAP 110* 69* 166* 249* 277*    Time coordinating discharge: 40 minutes  Signed:  Katheleen Stella A  [facility] 10/30/2011, 10:54 AM

## 2011-10-30 NOTE — Care Management Note (Signed)
    Page 1 of 1   10/30/2011     2:54:50 PM   CARE MANAGEMENT NOTE 10/30/2011  Patient:  [name], [name]   Account Number:  [id number]  Date Initiated:  10/30/2011  Documentation initiated by:  Tomi Bamberger  Subjective/Objective Assessment:   dx htn, anemia,weakness  admit- lives with spouse.     Action/Plan:   Anticipated DC Date:  10/30/2011   Anticipated DC Plan:  [place]  CM consult      Choice offered to / List presented to:             Status of service:  Completed, signed off Medicare Important Message given?   (If response is "NO", the following Medicare IM given date fields will be blank) Date Medicare IM given:   Date Additional Medicare IM given:    Discharge Disposition:  HOME/SELF CARE  Per UR Regulation:  Reviewed for med. necessity/level of care/duration of stay  If discussed at [place] of Stay Meetings, dates discussed:    Comments:  10/30/11 14:54 Tomi Bamberger RN, BSN [phone] patient lives with spouse, pta independent. Pt for dc today.

## 2011-10-30 NOTE — Progress Notes (Signed)
IV d/c'd, catheter was intact & site unremarkable.  Pt. Escorted to car via w/c by NT.  Alphonzo Lemmings, RN

## 2011-10-30 NOTE — Progress Notes (Signed)
Hypoglycemic Event  CBG: 69  Treatment: 15 GM carbohydrate snack  Symptoms: None  Follow-up CBG: [id number] CBG Result:110  Possible Reasons for Event: Inadequate meal intake  Comments/MD notified: Pt. Didn't have snack at bed time.    Wynetta Emery, Breck Hollinger C  Remember to initiate Hypoglycemia Order Set & complete

## 2011-10-30 NOTE — Clinical Documentation Improvement (Signed)
Anemia Blood Loss Clarification  THIS DOCUMENT IS NOT A PERMANENT PART OF THE MEDICAL RECORD  RESPOND TO THE THIS QUERY, FOLLOW THE INSTRUCTIONS BELOW:  1. If needed, update documentation for the patient's encounter via the notes activity.  2. Access this query again and click edit on the In [place].  3. After updating, or not, click F2 to complete all highlighted (required) fields concerning your review. Select "additional documentation in the medical record" OR "no additional documentation provided".  4. Click Sign note button.  5. The deficiency will fall out of your In Basket *Please let us know if you are not able to complete this workflow by phone or e-mail (listed below).        10/30/11  Dear Dr. Jerilynn Mages. [name] /Associates  In an effort to better capture your patient's severity of illness, reflect appropriate length of stay and utilization of resources, a review of the patient medical record has revealed the following indicators.    Based on your clinical judgment, please clarify and document in a progress note and/or discharge summary the clinical condition associated with the following supporting information:  In responding to this query please exercise your independent judgment.  The fact that a query is asked, does not imply that any particular answer is desired or expected.   Noted hgb 6.0 (9/18)  down from 9.9 (8/8)  transfusing 1 unit PRBC . Would the following be an appropriate secondary diagnosis.  Thank you  Possible Clinical Conditions?   Acute on chronic blood loss anemia  Other Condition  Cannot Clinically Determine     Risk Factors: Osler-Weber-Rendu syndrome   Signs and Symptoms: weakness, mild SOB, nausea symptomatic anemia x 1 week   Diagnostics: H&H on admit: 6.0/21.2 ( 9/18  Current H&H: 10.1/32.3 (9/20  Transfusion:  1 unit PRBC   Reviewed: additional documentation in the medical record  Thank You,  [place]  Documentation Specialist RN, BSN: Pager [phone] HIM off # [place]

## 2011-11-02 ENCOUNTER — Other Ambulatory Visit: Payer: Medicare Other | Admitting: Lab

## 2011-11-02 ENCOUNTER — Telehealth: Payer: Self-pay | Admitting: Hematology and Oncology

## 2011-11-02 NOTE — Telephone Encounter (Signed)
Pt called and r/s lab appt to 11/03/11

## 2011-11-03 ENCOUNTER — Other Ambulatory Visit (HOSPITAL_BASED_OUTPATIENT_CLINIC_OR_DEPARTMENT_OTHER): Payer: Medicare Other | Admitting: Lab

## 2011-11-03 DIAGNOSIS — D509 Iron deficiency anemia, unspecified: Secondary | ICD-10-CM

## 2011-11-03 LAB — BASIC METABOLIC PANEL (CC13)
BUN: 16 mg/dL (ref 7.0–26.0)
Chloride: 110 mEq/L — ABNORMAL HIGH (ref 98–107)
Glucose: 129 mg/dl — ABNORMAL HIGH (ref 70–99)
Potassium: 4.1 mEq/L (ref 3.5–5.1)

## 2011-11-03 LAB — CBC WITH DIFFERENTIAL/PLATELET
Basophils Absolute: 0 10*3/uL (ref 0.0–0.1)
Eosinophils Absolute: 0.3 10*3/uL (ref 0.0–0.5)
HGB: 10.1 g/dL — ABNORMAL LOW (ref 11.6–15.9)
MCV: 77.6 fL — ABNORMAL LOW (ref 79.5–101.0)
MONO#: 0.7 10*3/uL (ref 0.1–0.9)
NEUT#: 3.7 10*3/uL (ref 1.5–6.5)
RDW: 25.6 % — ABNORMAL HIGH (ref 11.2–14.5)
lymph#: 0.7 10*3/uL — ABNORMAL LOW (ref 0.9–3.3)

## 2011-11-04 ENCOUNTER — Ambulatory Visit: Payer: Medicare Other

## 2011-11-04 ENCOUNTER — Encounter: Payer: Self-pay | Admitting: Family

## 2011-11-04 ENCOUNTER — Telehealth: Payer: Self-pay | Admitting: Hematology and Oncology

## 2011-11-04 ENCOUNTER — Ambulatory Visit (HOSPITAL_BASED_OUTPATIENT_CLINIC_OR_DEPARTMENT_OTHER): Payer: Medicare Other

## 2011-11-04 ENCOUNTER — Ambulatory Visit: Payer: Medicare Other | Admitting: Hematology and Oncology

## 2011-11-04 ENCOUNTER — Ambulatory Visit (HOSPITAL_BASED_OUTPATIENT_CLINIC_OR_DEPARTMENT_OTHER): Payer: Medicare Other | Admitting: Family

## 2011-11-04 VITALS — BP 166/70 | HR 70 | Temp 98.2°F | Resp 20 | Ht 63.0 in | Wt 156.7 lb

## 2011-11-04 DIAGNOSIS — I780 Hereditary hemorrhagic telangiectasia: Secondary | ICD-10-CM

## 2011-11-04 DIAGNOSIS — D509 Iron deficiency anemia, unspecified: Secondary | ICD-10-CM

## 2011-11-04 DIAGNOSIS — R040 Epistaxis: Secondary | ICD-10-CM

## 2011-11-04 DIAGNOSIS — D508 Other iron deficiency anemias: Secondary | ICD-10-CM

## 2011-11-04 MED ORDER — SODIUM CHLORIDE 0.9 % IV SOLN
1020.0000 mg | Freq: Once | INTRAVENOUS | Status: AC
  Administered 2011-11-04: 1020 mg via INTRAVENOUS
  Filled 2011-11-04: qty 34

## 2011-11-04 MED ORDER — SODIUM CHLORIDE 0.9 % IV SOLN
Freq: Once | INTRAVENOUS | Status: AC
  Administered 2011-11-04: 15:00:00 via INTRAVENOUS

## 2011-11-04 MED ORDER — SODIUM CHLORIDE 0.9 % IJ SOLN
10.0000 mL | INTRAMUSCULAR | Status: DC | PRN
  Filled 2011-11-04: qty 10

## 2011-11-04 NOTE — Telephone Encounter (Signed)
Gave pt appt for November 2013 lab and MD

## 2011-11-04 NOTE — Patient Instructions (Signed)
Ferumoxytol injection What is this medicine? FERUMOXYTOL is an iron complex. Iron is used to make healthy red blood cells, which carry oxygen and nutrients throughout the body. This medicine is used to treat iron deficiency anemia in people with chronic kidney disease. This medicine may be used for other purposes; ask your health care provider or pharmacist if you have questions. What should I tell my health care provider before I take this medicine? They need to know if you have any of these conditions: -anemia not caused by low iron levels -high levels of iron in the blood -magnetic resonance imaging (MRI) test scheduled -an unusual or allergic reaction to iron, other medicines, foods, dyes, or preservatives -pregnant or trying to get pregnant -breast-feeding How should I use this medicine? This medicine is for infusion into a vein. It is given by a health care professional in a hospital or clinic setting. Talk to your pediatrician regarding the use of this medicine in children. Special care may be needed. Overdosage: If you think you've taken too much of this medicine contact a poison control center or emergency room at once. Overdosage: If you think you have taken too much of this medicine contact a poison control center or emergency room at once. NOTE: This medicine is only for you. Do not share this medicine with others. What if I miss a dose? It is important not to miss your dose. Call your doctor or health care professional if you are unable to keep an appointment. What may interact with this medicine? This medicine may interact with the following medications: -other iron products This list may not describe all possible interactions. Give your health care provider a list of all the medicines, herbs, non-prescription drugs, or dietary supplements you use. Also tell them if you smoke, drink alcohol, or use illegal drugs. Some items may interact with your medicine. What should I watch  for while using this medicine? Visit your doctor or healthcare professional regularly. Tell your doctor or healthcare professional if your symptoms do not start to get better or if they get worse. You may need blood work done while you are taking this medicine. You may need to follow a special diet. Talk to your doctor. Foods that contain iron include: whole grains/cereals, dried fruits, beans, or peas, leafy green vegetables, and organ meats (liver, kidney). What side effects may I notice from receiving this medicine? Side effects that you should report to your doctor or health care professional as soon as possible: -allergic reactions like skin rash, itching or hives, swelling of the face, lips, or tongue -breathing problems -changes in blood pressure -feeling faint or lightheaded, falls -fever or chills -flushing, sweating, or hot feelings -swelling of the ankles or feet Side effects that usually do not require medical attention (Report these to your doctor or health care professional if they continue or are bothersome.): -diarrhea -headache -nausea, vomiting -stomach pain This list may not describe all possible side effects. Call your doctor for medical advice about side effects. You may report side effects to FDA at 1-800-FDA-1088. Where should I keep my medicine? This drug is given in a hospital or clinic and will not be stored at home. NOTE: This sheet is a summary. It may not cover all possible information. If you have questions about this medicine, talk to your doctor, pharmacist, or health care provider.  2012, Elsevier/Gold Standard. (10/19/2007 9:48:25 PM) 

## 2011-11-04 NOTE — Progress Notes (Signed)
Patient ID: [name], female   DOB: [birth year], 71 y.o.   MRN: [id number] CSN: [id number]  Cc:  Nolene Ebbs, MD Ricard Dillon, MD Leonides Sake Lucia Gaskins, MD  Identifying Statement: [name] is a 71 y.o. 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  The patient was last seen by our office on 08/05/11. The patient also received a Feraheme infusion of 1,020 mg on 08/05/2011.   Since the patient's last office visit she was admitted to the hospital on 10/28/2011 for weakness and shortness of breath. While hospitalized the patient received 3 units of packed red blood cells and stated that she has been feeling well ever since.    [name] states that she had an episode that epistaxis yesterday, but her epistaxis episodes have been isolated incidences. The patient does see an ENT for recurrent epistaxis. The patient denies any GI bleeding, unusual bleeding or bruising, no nausea vomiting or diarrhea, she has not had any shortness of breath or lightheadedness, she denies fever, chills and night sweats, but does state she has a pain in her second toe on the left foot that has been ongoing.  The patient was asked to follow up with her primary care physician regarding her toe pain. The patient denies any other symptomatology.   Medications: Current Outpatient Prescriptions  Medication Sig Dispense Refill  . enalapril (VASOTEC) 20 MG tablet Take 20 mg by mouth daily.      . insulin glargine (LANTUS) 100 UNIT/ML injection Inject 40 Units into the skin at bedtime.      . metFORMIN (GLUCOPHAGE) 1000 MG tablet Take 1,000 mg by mouth daily with breakfast.       . pantoprazole (PROTONIX) 40 MG tablet Take 40 mg by mouth at bedtime.       . sodium chloride (OCEAN) 0.65 % nasal spray  Place 1 spray into the nose daily.       . vitamin B-12 (CYANOCOBALAMIN) 1000 MCG tablet Take 1,000 mcg by mouth daily with breakfast.       . Vitamin D, Ergocalciferol, (DRISDOL) 50000 UNITS CAPS Take 50,000 Units by mouth every 7 (seven) days. On tuesday       No current facility-administered medications for this visit.   Facility-Administered Medications Ordered in Other Visits  Medication Dose Route Frequency Provider Last Rate Last Dose  . 0.9 %  sodium chloride infusion   Intravenous Once Vivien Rota, NP 20 mL/hr at 11/04/11 1505    . ferumoxytol (FERAHEME) 1,020 mg in sodium chloride 0.9 % 100 mL IVPB  1,020 mg Intravenous Once Vivien Rota, NP   1,020 mg at 11/04/11 1520  . sodium chloride 0.9 % injection 10 mL  10 mL Intracatheter PRN Vivien Rota, NP        Allergies: Allergies  Allergen Reactions  . Aspirin Nausea Only    Past Medical History: Past Medical History  Diagnosis Date  . Anemia   . S/P endoscopy     with laser treatment  08/19/2006  . History of epistaxis   . Diabetes mellitus     insulin requiring.  Marland Kitchen Hypertension   . Hyperlipidemia   . Telangiectasia     Gastric   . Lichen planus     Both lower extremities  . GERD (gastroesophageal reflux disease)   . Heart  murmur     Family History: Family History  Problem Relation Age of Onset  . Breast cancer    . Malignant hyperthermia Neg Hx   . Stroke Father     Social History: History  Substance Use Topics  . Smoking status: Former Research scientist (life sciences)  . Smokeless tobacco: Never Used  . Alcohol Use: No    Review of Systems: 10 point review of systems was completed and is negative except as noted above.   Physical Exam: Blood pressure 166/70, pulse 70, temperature 98.2 F (36.8 C), temperature source Oral, resp. rate 20, height 5\' 3"  (1.6 m), weight 156 lb 11.2 oz (71.079 kg).  General appearance: Alert, cooperative, well nourished, no apparent distress Head: Normocephalic, without  obvious abnormality, atraumatic Eyes: Conjunctivae/corneas clear, PERRLA, EOMI Nose: Nares, septum and mucosa are normal, no drainage or sinus tenderness Neck: No adenopathy, supple, symmetrical, trachea midline, thyroid not enlarged, no tenderness Resp: Clear to auscultation bilaterally Cardio: Regular rate and rhythm, S1, S2 normal, 2/6 murmur, no click, rub or gallop GI: Soft, distended, non-tender, hypoactive bowel sounds, no organomegaly Extremities: Extremities normal, atraumatic, no cyanosis or edema Skin: Numerous scars on bilateral LEs Lymph nodes: Cervical, supraclavicular, and axillary nodes normal Neurologic: Grossly normal   Laboratory Data: Results for orders placed in visit on 11/03/11 (from the past 48 hour(s))  CBC WITH DIFFERENTIAL     Status: Abnormal   Collection Time   11/03/11  8:19 AM      Component Value Range Comment   WBC 5.4  3.9 - 10.3 10e3/uL    NEUT# 3.7  1.5 - 6.5 10e3/uL    HGB 10.1 (*) 11.6 - 15.9 g/dL    HCT 31.3 (*) 34.8 - 46.6 %    Platelets 145  145 - 400 10e3/uL    MCV 77.6 (*) 79.5 - 101.0 fL    MCH 25.0 (*) 25.1 - 34.0 pg    MCHC 32.2  31.5 - 36.0 g/dL    RBC 4.04  3.70 - 5.45 10e6/uL    RDW 25.6 (*) 11.2 - 14.5 %    lymph# 0.7 (*) 0.9 - 3.3 10e3/uL    MONO# 0.7  0.1 - 0.9 10e3/uL    Eosinophils Absolute 0.3  0.0 - 0.5 10e3/uL    Basophils Absolute 0.0  0.0 - 0.1 10e3/uL    NEUT% 67.6  38.4 - 76.8 %    LYMPH% 13.6 (*) 14.0 - 49.7 %    MONO% 12.8  0.0 - 14.0 %    EOS% 5.5  0.0 - 7.0 %    BASO% 0.5  0.0 - 2.0 %   BASIC METABOLIC PANEL (0000000)     Status: Abnormal   Collection Time   11/03/11  8:19 AM      Component Value Range Comment   Sodium 143  136 - 145 mEq/L    Potassium 4.1  3.5 - 5.1 mEq/L    Chloride 110 (*) 98 - 107 mEq/L    CO2 25  22 - 29 mEq/L    Glucose 129 (*) 70 - 99 mg/dl    BUN 16.0  7.0 - 26.0 mg/dL    Creatinine 0.9  0.6 - 1.1 mg/dL    Calcium 9.5  8.4 - 10.4 mg/dL     Impression/Plan: [name] is a  71 year old African American female with a history of iron deficiency anemia related to Osler-Weber-Rendu Syndrome with GI telangiectasia as well as recurrent epistaxis. The patient is scheduled to receive a 1020  mg IV Feraheme infusion today. The patient will follow up with [place] again in 2 months time with laboratories (AMP, CBC, Iron/TIBC, Ferritin, Folate and Vitamin B12) to be drawn a week in advance of her office visit.  Mrs. Krein will contact us if she has any further questions or concerns in the interim.   Ailene Ards, NP-C 11/04/2011, 2:13 PM

## 2011-11-04 NOTE — Patient Instructions (Signed)
If you experience persistent SOB - proceed to the nearest ER and also discuss with your primary care physician Dr. Jeanie Cooks.

## 2011-12-28 ENCOUNTER — Other Ambulatory Visit (HOSPITAL_BASED_OUTPATIENT_CLINIC_OR_DEPARTMENT_OTHER): Payer: Medicare Other | Admitting: Lab

## 2011-12-28 DIAGNOSIS — D649 Anemia, unspecified: Secondary | ICD-10-CM

## 2011-12-28 DIAGNOSIS — D509 Iron deficiency anemia, unspecified: Secondary | ICD-10-CM

## 2011-12-28 LAB — COMPREHENSIVE METABOLIC PANEL (CC13)
ALT: 37 U/L (ref 0–55)
AST: 42 U/L — ABNORMAL HIGH (ref 5–34)
Chloride: 111 mEq/L — ABNORMAL HIGH (ref 98–107)
Creatinine: 0.7 mg/dL (ref 0.6–1.1)
Total Bilirubin: 0.37 mg/dL (ref 0.20–1.20)

## 2011-12-28 LAB — CBC WITH DIFFERENTIAL/PLATELET
BASO%: 0.6 % (ref 0.0–2.0)
LYMPH%: 16.6 % (ref 14.0–49.7)
MCHC: 33.3 g/dL (ref 31.5–36.0)
MONO#: 0.5 10*3/uL (ref 0.1–0.9)
MONO%: 10 % (ref 0.0–14.0)
Platelets: 234 10*3/uL (ref 145–400)
RBC: 3.22 10*6/uL — ABNORMAL LOW (ref 3.70–5.45)
RDW: 15.7 % — ABNORMAL HIGH (ref 11.2–14.5)
WBC: 4.9 10*3/uL (ref 3.9–10.3)
nRBC: 0 % (ref 0–0)

## 2011-12-29 LAB — IRON AND TIBC: %SAT: 10 % — ABNORMAL LOW (ref 20–55)

## 2011-12-30 ENCOUNTER — Ambulatory Visit (HOSPITAL_BASED_OUTPATIENT_CLINIC_OR_DEPARTMENT_OTHER): Payer: Medicare Other | Admitting: Hematology and Oncology

## 2011-12-30 ENCOUNTER — Encounter: Payer: Self-pay | Admitting: Hematology and Oncology

## 2011-12-30 ENCOUNTER — Telehealth: Payer: Self-pay | Admitting: Hematology and Oncology

## 2011-12-30 ENCOUNTER — Telehealth: Payer: Self-pay | Admitting: *Deleted

## 2011-12-30 VITALS — BP 144/52 | HR 64 | Temp 97.8°F | Resp 18 | Ht 63.0 in | Wt 155.4 lb

## 2011-12-30 DIAGNOSIS — D509 Iron deficiency anemia, unspecified: Secondary | ICD-10-CM

## 2011-12-30 DIAGNOSIS — I780 Hereditary hemorrhagic telangiectasia: Secondary | ICD-10-CM

## 2011-12-30 DIAGNOSIS — R040 Epistaxis: Secondary | ICD-10-CM

## 2011-12-30 DIAGNOSIS — D539 Nutritional anemia, unspecified: Secondary | ICD-10-CM

## 2011-12-30 NOTE — Telephone Encounter (Signed)
gv and printed pt appt schedule for 11.22.13..the patient aware...emailed michelle to add fereheme.

## 2011-12-30 NOTE — Telephone Encounter (Signed)
Per staff message and POF I have scheduled appt.  JMW  

## 2011-12-30 NOTE — Progress Notes (Signed)
CC:   Nolene Ebbs, M.D. Ricard Dillon, M.D. Leonides Sake. Lucia Gaskins, M.D.  IDENTIFYING STATEMENT:  The patient is a 71 year old woman with iron- deficiency anemia secondary to Osler-Weber-Rendu syndrome and gastric AVMs.  Mrs. Canipe reports infrequent epistaxis.  She notes reasonable energy levels and is able to perform all activities of daily living.  She was last seen in on November 04, 2011.  Has not received a blood transfusion since that time.  Did receive Feraheme him on 11/04/2011. Lab results are noted below.  MEDICATIONS:  Reviewed and updated.  ALLERGIES:  Aspirin.  PAST MEDICAL HISTORY/FAMILY HISTORY/SOCIAL HISTORY:  Unchanged.  REVIEW OF SYSTEMS:  Ten-point review of systems negative.  PHYSICAL EXAM:  General:  Patient is a well-appearing, well-nourished woman in no distress.  Vitals:  Pulse 64, blood pressure 144/52, temperature 97.8, respirations 18, weight 155.4 pounds.  HEENT:  Head is atraumatic, normocephalic.  Sclerae anicteric.  Mouth is moist.  Neck: Supple.  Chest and CVS:  Unremarkable.  Abdomen:  Soft, nontender.  No masses.  Bowel sounds present.  Extremities:  No edema.  LAB DATA:  12/28/2011 white cell count 4.9, hemoglobin 9, hematocrit 27, platelets 234.  Iron 30, TIBC 212, ferritin 11 (8 four months ago), saturation 10%.  Sodium 142, potassium 3.9, chloride 111, CO2 26, BUN 18, creatinine 0.7, glucose 152, T bili 0.37, alkaline phosphatase 93, AST 42, ALT 37.  IMPRESSION AND PLAN:  Mrs. Berteau is a 71 year old woman with history of iron-deficiency anemia secondary to [place]-Weber-Rendu syndrome with gastrointestinal telangiectasias, as well as recurrent epistaxis.  As a result of this, ferritin levels continue to decline and she requires IV iron replacement.  She will receive Feraheme sometime this week.  She follows up in 2 months' time.    ______________________________ Nira Retort, M.D. LIO/MEDQ  D:  12/30/2011  T:  12/30/2011   Job:  [id number]

## 2011-12-30 NOTE — Progress Notes (Signed)
This office note has been dictated.

## 2011-12-30 NOTE — Patient Instructions (Addendum)
[facility]  [id number]   [facility] - AFTER VISIT SUMMARY   **RECOMMENDATIONS MADE BY THE CONSULTANT AND ANY TEST    RESULTS WILL BE SENT TO YOUR REFERRING DOCTORS.   YOUR EXAM FINDINGS, LABS AND RESULTS WERE DISCUSSED BY YOUR MD TODAY.  YOU CAN GO TO THE [facility] WEB SITE FOR INSTRUCTIONS ON HOW TO ASSESS MY CHART FOR ADDITIONAL INFORMATION AS NEEDED.  Your Updated drug allergies are: Allergies as of 12/30/2011 - Review Complete 12/30/2011  Allergen Reaction Noted  . Aspirin Nausea Only 05/12/2006    Your current list of medications are: Current Outpatient Prescriptions  Medication Sig Dispense Refill  . enalapril (VASOTEC) 20 MG tablet Take 20 mg by mouth daily.      . insulin glargine (LANTUS) 100 UNIT/ML injection Inject 40 Units into the skin at bedtime.      . metFORMIN (GLUCOPHAGE) 1000 MG tablet Take 1,000 mg by mouth daily with breakfast.       . pantoprazole (PROTONIX) 40 MG tablet Take 40 mg by mouth at bedtime.       . sodium chloride (OCEAN) 0.65 % nasal spray Place 1 spray into the nose daily.       . vitamin B-12 (CYANOCOBALAMIN) 1000 MCG tablet Take 1,000 mcg by mouth daily with breakfast.       . Vitamin D, Ergocalciferol, (DRISDOL) 50000 UNITS CAPS Take 50,000 Units by mouth every 7 (seven) days. On tuesday         INSTRUCTIONS GIVEN AND DISCUSSED:  See attached schedule   SPECIAL INSTRUCTIONS/FOLLOW-UP:  See above.  I acknowledge that I have been informed and understand all the instructions given to me and received a copy.I know to contact the clinic, my physician, or go to the emergency Department if any problems should occur.   I do not have any more questions at this time, but understand that I may call the [facility] at [phone] during business hours should I have any further questions or need assistance in obtaining follow-up care.

## 2011-12-31 ENCOUNTER — Telehealth: Payer: Self-pay | Admitting: *Deleted

## 2011-12-31 ENCOUNTER — Telehealth: Payer: Self-pay | Admitting: Hematology and Oncology

## 2011-12-31 NOTE — Telephone Encounter (Signed)
s.w. pt and gv pt appt for Jan 2014.Marland KitchenMarland Kitchenpt will come tomorrow to and get updated schedule.

## 2011-12-31 NOTE — Telephone Encounter (Signed)
Per staff message I have scheduled appts. JMW  

## 2012-01-01 ENCOUNTER — Ambulatory Visit (HOSPITAL_BASED_OUTPATIENT_CLINIC_OR_DEPARTMENT_OTHER): Payer: Medicare Other

## 2012-01-01 VITALS — BP 163/57 | HR 73 | Temp 97.7°F

## 2012-01-01 DIAGNOSIS — D509 Iron deficiency anemia, unspecified: Secondary | ICD-10-CM

## 2012-01-01 DIAGNOSIS — D649 Anemia, unspecified: Secondary | ICD-10-CM

## 2012-01-01 MED ORDER — SODIUM CHLORIDE 0.9 % IV SOLN
Freq: Once | INTRAVENOUS | Status: AC
  Administered 2012-01-01: 14:00:00 via INTRAVENOUS

## 2012-01-01 MED ORDER — SODIUM CHLORIDE 0.9 % IV SOLN
1020.0000 mg | Freq: Once | INTRAVENOUS | Status: AC
  Administered 2012-01-01: 1020 mg via INTRAVENOUS
  Filled 2012-01-01: qty 34

## 2012-01-01 NOTE — Patient Instructions (Addendum)
Ferumoxytol injection What is this medicine? FERUMOXYTOL is an iron complex. Iron is used to make healthy red blood cells, which carry oxygen and nutrients throughout the body. This medicine is used to treat iron deficiency anemia in people with chronic kidney disease. This medicine may be used for other purposes; ask your health care provider or pharmacist if you have questions. What should I tell my health care provider before I take this medicine? They need to know if you have any of these conditions: -anemia not caused by low iron levels -high levels of iron in the blood -magnetic resonance imaging (MRI) test scheduled -an unusual or allergic reaction to iron, other medicines, foods, dyes, or preservatives -pregnant or trying to get pregnant -breast-feeding How should I use this medicine? This medicine is for infusion into a vein. It is given by a health care professional in a hospital or clinic setting. Talk to your pediatrician regarding the use of this medicine in children. Special care may be needed. Overdosage: If you think you've taken too much of this medicine contact a poison control center or emergency room at once. Overdosage: If you think you have taken too much of this medicine contact a poison control center or emergency room at once. NOTE: This medicine is only for you. Do not share this medicine with others. What if I miss a dose? It is important not to miss your dose. Call your doctor or health care professional if you are unable to keep an appointment. What may interact with this medicine? This medicine may interact with the following medications: -other iron products This list may not describe all possible interactions. Give your health care provider a list of all the medicines, herbs, non-prescription drugs, or dietary supplements you use. Also tell them if you smoke, drink alcohol, or use illegal drugs. Some items may interact with your medicine. What should I watch  for while using this medicine? Visit your doctor or healthcare professional regularly. Tell your doctor or healthcare professional if your symptoms do not start to get better or if they get worse. You may need blood work done while you are taking this medicine. You may need to follow a special diet. Talk to your doctor. Foods that contain iron include: whole grains/cereals, dried fruits, beans, or peas, leafy green vegetables, and organ meats (liver, kidney). What side effects may I notice from receiving this medicine? Side effects that you should report to your doctor or health care professional as soon as possible: -allergic reactions like skin rash, itching or hives, swelling of the face, lips, or tongue -breathing problems -changes in blood pressure -feeling faint or lightheaded, falls -fever or chills -flushing, sweating, or hot feelings -swelling of the ankles or feet Side effects that usually do not require medical attention (Report these to your doctor or health care professional if they continue or are bothersome.): -diarrhea -headache -nausea, vomiting -stomach pain This list may not describe all possible side effects. Call your doctor for medical advice about side effects. You may report side effects to FDA at 1-800-FDA-1088. Where should I keep my medicine? This drug is given in a hospital or clinic and will not be stored at home. NOTE: This sheet is a summary. It may not cover all possible information. If you have questions about this medicine, talk to your doctor, pharmacist, or health care provider.  2012, Elsevier/Gold Standard. (10/19/2007 9:48:25 PM) 

## 2012-01-30 ENCOUNTER — Telehealth: Payer: Self-pay | Admitting: *Deleted

## 2012-01-30 NOTE — Telephone Encounter (Signed)
Per new reassignment I have moved her infusion appt to follow MD visit on 1/22.   JMW

## 2012-01-30 NOTE — Telephone Encounter (Signed)
Pt was called to notify pt of schedule change and provider change. Pt was notified that Dr. Jamse Arn would no longer be with the practice after 02/09/2012, but her care would be provided by Dr. [name]. Pt agreed with new appts,times and dates as well as new provider.

## 2012-02-13 ENCOUNTER — Encounter: Payer: Self-pay | Admitting: *Deleted

## 2012-02-26 ENCOUNTER — Other Ambulatory Visit: Payer: Self-pay | Admitting: Oncology

## 2012-02-26 DIAGNOSIS — D62 Acute posthemorrhagic anemia: Secondary | ICD-10-CM

## 2012-03-02 ENCOUNTER — Ambulatory Visit (HOSPITAL_BASED_OUTPATIENT_CLINIC_OR_DEPARTMENT_OTHER): Payer: Medicare Other

## 2012-03-02 ENCOUNTER — Telehealth: Payer: Self-pay | Admitting: Oncology

## 2012-03-02 ENCOUNTER — Encounter (HOSPITAL_COMMUNITY)
Admission: RE | Admit: 2012-03-02 | Discharge: 2012-03-02 | Disposition: A | Discharge status: Home / Self Care | Payer: Medicare Other | Source: Ambulatory Visit | Attending: Oncology | Admitting: Oncology

## 2012-03-02 ENCOUNTER — Encounter: Payer: Self-pay | Admitting: Oncology

## 2012-03-02 ENCOUNTER — Other Ambulatory Visit: Payer: Self-pay | Admitting: *Deleted

## 2012-03-02 ENCOUNTER — Other Ambulatory Visit (HOSPITAL_BASED_OUTPATIENT_CLINIC_OR_DEPARTMENT_OTHER): Payer: Medicare Other | Admitting: Lab

## 2012-03-02 ENCOUNTER — Ambulatory Visit (HOSPITAL_BASED_OUTPATIENT_CLINIC_OR_DEPARTMENT_OTHER): Payer: Medicare Other | Admitting: Oncology

## 2012-03-02 VITALS — BP 173/61 | HR 89 | Temp 98.5°F | Resp 20 | Ht 63.0 in | Wt 157.0 lb

## 2012-03-02 DIAGNOSIS — K922 Gastrointestinal hemorrhage, unspecified: Secondary | ICD-10-CM

## 2012-03-02 DIAGNOSIS — R040 Epistaxis: Secondary | ICD-10-CM

## 2012-03-02 DIAGNOSIS — I10 Essential (primary) hypertension: Secondary | ICD-10-CM

## 2012-03-02 DIAGNOSIS — I780 Hereditary hemorrhagic telangiectasia: Secondary | ICD-10-CM

## 2012-03-02 DIAGNOSIS — D509 Iron deficiency anemia, unspecified: Secondary | ICD-10-CM

## 2012-03-02 DIAGNOSIS — D539 Nutritional anemia, unspecified: Secondary | ICD-10-CM

## 2012-03-02 DIAGNOSIS — D649 Anemia, unspecified: Secondary | ICD-10-CM

## 2012-03-02 DIAGNOSIS — K31819 Angiodysplasia of stomach and duodenum without bleeding: Secondary | ICD-10-CM

## 2012-03-02 HISTORY — DX: Essential (primary) hypertension: I10

## 2012-03-02 LAB — CBC WITH DIFFERENTIAL/PLATELET
BASO%: 0.7 % (ref 0.0–2.0)
Basophils Absolute: 0 10*3/uL (ref 0.0–0.1)
HCT: 22.1 % — ABNORMAL LOW (ref 34.8–46.6)
LYMPH%: 13.4 % — ABNORMAL LOW (ref 14.0–49.7)
MCH: 24.5 pg — ABNORMAL LOW (ref 25.1–34.0)
MCHC: 31.8 g/dL (ref 31.5–36.0)
MONO#: 0.5 10*3/uL (ref 0.1–0.9)
NEUT%: 67.8 % (ref 38.4–76.8)
Platelets: 178 10*3/uL (ref 145–400)
WBC: 4.2 10*3/uL (ref 3.9–10.3)

## 2012-03-02 LAB — PREPARE RBC (CROSSMATCH)

## 2012-03-02 MED ORDER — FERUMOXYTOL INJECTION 510 MG/17 ML
510.0000 mg | Freq: Once | INTRAVENOUS | Status: AC
  Administered 2012-03-02: 510 mg via INTRAVENOUS
  Filled 2012-03-02: qty 17

## 2012-03-02 NOTE — Patient Instructions (Signed)
Start AMICAR 500 mg four times daily to see if we can decrease the bleeding Lab check every month  Next on February 17 Return tomorrow 1/23/ for blood transfusion MD visit Monday 4/21 at 2:30 PM

## 2012-03-02 NOTE — Progress Notes (Signed)
Hematology and Oncology Follow Up Visit  [name] [id number] 11/04/1940 72 y.o. 03/02/2012 2:35 PM   Principle Diagnosis:No diagnosis found.   Interim History:  I will be   assuming the hematology care for this patient since Dr. Jamse Arn left the practice.  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 Majority of her bleeding is related to the nasal mucosa and the gastric mucosa. She has had a number of endoscopic procedures by Dr. Teena Irani most recently 02/26/2011. AVMs noted in the proximal stomach and in the duodenum. She had a repeat laser fulguration of a number of these lesions at that time. She has recurrent epistaxis. She underwent a right nasal septoplasty procedure with a full thickness skin graft by Dr. Radene Journey on 11/14/2009. Frequency and severity of her nosebleeds has decreased but not stopped completely. She has been followed in our office since September 2008. She requires periodic blood transfusions and parenteral iron infusions to maintain her hemoglobin at 10 g or above. Her average hemoglobin runs around 9 g. Dr. Jenetta Downer did an initial evaluation including pro time, PTT, Coombs' test, haptoglobin, von Willebrand profile, back in 2008 and all of these tests were normal. Her urine tested negative for blood. Most recent iron infusion given in our office was on 08/05/2011. Most recent iron studies done 12/28/2011 with ferritin 11, iron 30, TIBC 312, percent saturation 10. Blood type is A, negative.  She has adjusted well to chronic anemia and has minimal or no symptoms until hemoglobin falls to less than 6 g. Hemoglobin today is 7 g and she denies any dyspnea, palpitations, or chest pain.  She believes an older brother may be affected. He was put on a birth control pill. (Estrogen);  as well as a younger sister age 39 who has required a subtotal gastrectomy in the past for bleeding. She is one of 18 children. Some  of the children died in childbirth. She has 3 surviving brothers and one sister. [place] Her father murdered her mother and was sent to prison. She is married but never had any children.  Past history includes: Hypertension, lichen planus, insulin-dependent diabetes. No prior MI. No history of hepatitis, yellow jaundice. Medications: reviewed  Allergies:  Allergies  Allergen Reactions  . Aspirin Nausea Only    Review of Systems: Constitutional:   Remarkably no constitutional symptoms Respiratory: no cough or dyspnea Cardiovascular:  See above Gastrointestinal: no recent hematochezia or melena Genito-Urinary:  No hematuria, no vaginal bleeding Musculoskeletal: no muscle or bone pain Neurologic: no headache or change in vision Skin: no rash or ecchymosis Remaining ROS negative.  Physical Exam: Blood pressure 173/61, pulse 89, temperature 98.5 F (36.9 C), temperature source Oral, resp. rate 20, height 5\' 3"  (1.6 m), weight 157 lb (71.215 kg). Wt Readings from Last 3 Encounters:  03/02/12 157 lb (71.215 kg)  12/30/11 155 lb 6.4 oz (70.489 kg)  11/04/11 156 lb 11.2 oz (71.079 kg)     General appearance:  Well-nourished African American woman HENNT:  Pharynx no erythema or exudate. Single hemangioma on her tongue. No hemangiomas on the lips or conjunctivae. There is a left subconjunctival hemorrhage. Lymph nodes:  No adenopathy Breasts: not examined today Lungs: clear to auscultation resonant to percussion Heart: regular rhythm. 2/6 aortic systolic murmur Abdomen: soft, nontender, no mass, no organomegaly Extremities: there are scattered hemangiomas on her fingers no subungual hemangiomas Vascular: left carotid bruit Neurologic: alert and oriented, PERRLA,  optic disc sharp, vessels normal, no neovascularization of the retinal vessels Skin: scattered small hemangiomas on the ventral side of her fingers  Lab Results: Lab Results  Component Value Date    WBC 4.2 03/02/2012   HGB 7.0* 03/02/2012   HCT 22.1* 03/02/2012   MCV 77.2* 03/02/2012   PLT 178 03/02/2012     Chemistry      Component Value Date/Time   NA 142 12/28/2011 0825   NA 145 10/30/2011 0640   K 3.9 12/28/2011 0825   K 4.3 10/30/2011 0640   CL 111* 12/28/2011 0825   CL 110 10/30/2011 0640   CO2 26 12/28/2011 0825   CO2 26 10/30/2011 0640   BUN 18.0 12/28/2011 0825   BUN 13 10/30/2011 0640   CREATININE 0.7 12/28/2011 0825   CREATININE 0.90 10/30/2011 0640      Component Value Date/Time   CALCIUM 9.6 12/28/2011 0825   CALCIUM 9.8 10/30/2011 0640   ALKPHOS 93 12/28/2011 0825   ALKPHOS 89 10/29/2011 0650   AST 42* 12/28/2011 0825   AST 19 10/29/2011 0650   ALT 37 12/28/2011 0825   ALT 15 10/29/2011 0650   BILITOT 0.37 12/28/2011 0825   BILITOT 1.4* 10/29/2011 0650      Impression and Plan: #1. HHT I'm going to try her on Amicar 500 mg 4 times a day. If she tolerates it I will increase the dose to 1 g 4 times a day to see if we can decrease the mucosal bleeding. There are some case reports using Avastin in the angiogenesis agent to control bleeding and HHT and if her bleeding becomes worse over time, I will look into reimbursement of this drug for her.  #2. Ongoing GI bleeding and epistaxis secondary to #1  #3. Essential hypertension  #4. Insulin-dependent diabetes  #5. Lichen planus  #6. Aortic systolic heart murmur (I need to confirm this the next time she comes in,  I may have gotten her mixed up with another patient)   CC:Marland Kitchen    [name], MD 1/22/20142:35 PM

## 2012-03-02 NOTE — Telephone Encounter (Signed)
appts made and printed for pt aom °

## 2012-03-03 ENCOUNTER — Ambulatory Visit (HOSPITAL_BASED_OUTPATIENT_CLINIC_OR_DEPARTMENT_OTHER): Payer: Medicare Other

## 2012-03-03 VITALS — BP 121/55 | HR 84 | Temp 98.5°F | Resp 18

## 2012-03-03 DIAGNOSIS — D649 Anemia, unspecified: Secondary | ICD-10-CM

## 2012-03-03 DIAGNOSIS — I780 Hereditary hemorrhagic telangiectasia: Secondary | ICD-10-CM

## 2012-03-03 LAB — FERRITIN: Ferritin: 8 ng/mL — ABNORMAL LOW (ref 10–291)

## 2012-03-03 LAB — IRON AND TIBC: %SAT: 5 % — ABNORMAL LOW (ref 20–55)

## 2012-03-03 MED ORDER — SODIUM CHLORIDE 0.9 % IV SOLN
250.0000 mL | Freq: Once | INTRAVENOUS | Status: AC
  Administered 2012-03-03: 250 mL via INTRAVENOUS

## 2012-03-03 NOTE — Patient Instructions (Addendum)
Blood Transfusion Information WHAT IS A BLOOD TRANSFUSION? A transfusion is the replacement of blood or some of its parts. Blood is made up of multiple cells which provide different functions.  Red blood cells carry oxygen and are used for blood loss replacement.  White blood cells fight against infection.  Platelets control bleeding.  Plasma helps clot blood.  Other blood products are available for specialized needs, such as hemophilia or other clotting disorders. BEFORE THE TRANSFUSION  Who gives blood for transfusions?   You may be able to donate blood to be used at a later date on yourself (autologous donation).  Relatives can be asked to donate blood. This is generally not any safer than if you have received blood from a stranger. The same precautions are taken to ensure safety when a relative's blood is donated.  Healthy volunteers who are fully evaluated to make sure their blood is safe. This is blood bank blood. Transfusion therapy is the safest it has ever been in the practice of medicine. Before blood is taken from a donor, a complete history is taken to make sure that person has no history of diseases nor engages in risky social behavior (examples are intravenous drug use or sexual activity with multiple partners). The donor's travel history is screened to minimize risk of transmitting infections, such as malaria. The donated blood is tested for signs of infectious diseases, such as HIV and hepatitis. The blood is then tested to be sure it is compatible with you in order to minimize the chance of a transfusion reaction. If you or a relative donates blood, this is often done in anticipation of surgery and is not appropriate for emergency situations. It takes many days to process the donated blood. RISKS AND COMPLICATIONS Although transfusion therapy is very safe and saves many lives, the main dangers of transfusion include:   Getting an infectious disease.  Developing a  transfusion reaction. This is an allergic reaction to something in the blood you were given. Every precaution is taken to prevent this. The decision to have a blood transfusion has been considered carefully by your caregiver before blood is given. Blood is not given unless the benefits outweigh the risks. AFTER THE TRANSFUSION  Right after receiving a blood transfusion, you will usually feel much better and more energetic. This is especially true if your red blood cells have gotten low (anemic). The transfusion raises the level of the red blood cells which carry oxygen, and this usually causes an energy increase.  The nurse administering the transfusion will monitor you carefully for complications. HOME CARE INSTRUCTIONS  No special instructions are needed after a transfusion. You may find your energy is better. Speak with your caregiver about any limitations on activity for underlying diseases you may have. SEEK MEDICAL CARE IF:   Your condition is not improving after your transfusion.  You develop redness or irritation at the intravenous (IV) site. SEEK IMMEDIATE MEDICAL CARE IF:  Any of the following symptoms occur over the next 12 hours:  Shaking chills.  You have a temperature by mouth above 102 F (38.9 C), not controlled by medicine.  Chest, back, or muscle pain.  People around you feel you are not acting correctly or are confused.  Shortness of breath or difficulty breathing.  Dizziness and fainting.  You get a rash or develop hives.  You have a decrease in urine output.  Your urine turns a dark color or changes to pink, red, or brown. Any of the following   symptoms occur over the next 10 days:  You have a temperature by mouth above 102 F (38.9 C), not controlled by medicine.  Shortness of breath.  Weakness after normal activity.  The white part of the eye turns yellow (jaundice).  You have a decrease in the amount of urine or are urinating less often.  Your  urine turns a dark color or changes to pink, red, or brown. Document Released: 01/24/2000 Document Revised: 04/20/2011 Document Reviewed: 09/12/2007 ExitCare Patient Information 2013 [employer].  

## 2012-03-04 ENCOUNTER — Other Ambulatory Visit: Payer: Medicare Other | Admitting: Lab

## 2012-03-04 ENCOUNTER — Ambulatory Visit: Payer: Medicare Other | Admitting: Hematology and Oncology

## 2012-03-04 ENCOUNTER — Ambulatory Visit: Payer: Medicare Other

## 2012-03-04 LAB — TYPE AND SCREEN: Unit division: 0

## 2012-03-28 ENCOUNTER — Other Ambulatory Visit (HOSPITAL_BASED_OUTPATIENT_CLINIC_OR_DEPARTMENT_OTHER): Payer: Medicare Other | Admitting: Lab

## 2012-03-28 DIAGNOSIS — K31819 Angiodysplasia of stomach and duodenum without bleeding: Secondary | ICD-10-CM

## 2012-03-28 DIAGNOSIS — Q2733 Arteriovenous malformation of digestive system vessel: Secondary | ICD-10-CM

## 2012-03-28 DIAGNOSIS — I10 Essential (primary) hypertension: Secondary | ICD-10-CM

## 2012-03-28 DIAGNOSIS — D509 Iron deficiency anemia, unspecified: Secondary | ICD-10-CM

## 2012-03-28 DIAGNOSIS — I780 Hereditary hemorrhagic telangiectasia: Secondary | ICD-10-CM

## 2012-03-28 LAB — CBC WITH DIFFERENTIAL/PLATELET
BASO%: 0.7 % (ref 0.0–2.0)
EOS%: 7.1 % — ABNORMAL HIGH (ref 0.0–7.0)
LYMPH%: 17.1 % (ref 14.0–49.7)
MCH: 26.8 pg (ref 25.1–34.0)
MCHC: 32.3 g/dL (ref 31.5–36.0)
MCV: 83.2 fL (ref 79.5–101.0)
MONO%: 10.7 % (ref 0.0–14.0)
NEUT%: 64.4 % (ref 38.4–76.8)
Platelets: 210 10*3/uL (ref 145–400)
RBC: 3.36 10*6/uL — ABNORMAL LOW (ref 3.70–5.45)
WBC: 5 10*3/uL (ref 3.9–10.3)

## 2012-04-06 ENCOUNTER — Encounter (HOSPITAL_COMMUNITY): Payer: Self-pay | Admitting: Nurse Practitioner

## 2012-04-06 ENCOUNTER — Emergency Department (HOSPITAL_COMMUNITY)
Admission: EM | Admit: 2012-04-06 | Discharge: 2012-04-06 | Disposition: A | Discharge status: Home / Self Care | Payer: Medicare Other | Attending: Emergency Medicine | Admitting: Emergency Medicine

## 2012-04-06 ENCOUNTER — Other Ambulatory Visit: Payer: Self-pay | Admitting: Oncology

## 2012-04-06 DIAGNOSIS — I789 Disease of capillaries, unspecified: Secondary | ICD-10-CM | POA: Insufficient documentation

## 2012-04-06 DIAGNOSIS — D649 Anemia, unspecified: Secondary | ICD-10-CM | POA: Insufficient documentation

## 2012-04-06 DIAGNOSIS — R231 Pallor: Secondary | ICD-10-CM | POA: Insufficient documentation

## 2012-04-06 DIAGNOSIS — Z87891 Personal history of nicotine dependence: Secondary | ICD-10-CM | POA: Insufficient documentation

## 2012-04-06 DIAGNOSIS — E119 Type 2 diabetes mellitus without complications: Secondary | ICD-10-CM | POA: Insufficient documentation

## 2012-04-06 DIAGNOSIS — I10 Essential (primary) hypertension: Secondary | ICD-10-CM | POA: Insufficient documentation

## 2012-04-06 DIAGNOSIS — Z79899 Other long term (current) drug therapy: Secondary | ICD-10-CM | POA: Insufficient documentation

## 2012-04-06 DIAGNOSIS — Z794 Long term (current) use of insulin: Secondary | ICD-10-CM | POA: Insufficient documentation

## 2012-04-06 DIAGNOSIS — Z8639 Personal history of other endocrine, nutritional and metabolic disease: Secondary | ICD-10-CM | POA: Insufficient documentation

## 2012-04-06 DIAGNOSIS — K219 Gastro-esophageal reflux disease without esophagitis: Secondary | ICD-10-CM | POA: Insufficient documentation

## 2012-04-06 DIAGNOSIS — Z862 Personal history of diseases of the blood and blood-forming organs and certain disorders involving the immune mechanism: Secondary | ICD-10-CM | POA: Insufficient documentation

## 2012-04-06 DIAGNOSIS — Z8719 Personal history of other diseases of the digestive system: Secondary | ICD-10-CM | POA: Insufficient documentation

## 2012-04-06 DIAGNOSIS — Z872 Personal history of diseases of the skin and subcutaneous tissue: Secondary | ICD-10-CM | POA: Insufficient documentation

## 2012-04-06 DIAGNOSIS — Z8679 Personal history of other diseases of the circulatory system: Secondary | ICD-10-CM | POA: Insufficient documentation

## 2012-04-06 LAB — URINALYSIS, ROUTINE W REFLEX MICROSCOPIC
Glucose, UA: 500 mg/dL — AB
Hgb urine dipstick: NEGATIVE
Leukocytes, UA: NEGATIVE
Protein, ur: NEGATIVE mg/dL
pH: 6 (ref 5.0–8.0)

## 2012-04-06 LAB — CBC WITH DIFFERENTIAL/PLATELET
Basophils Relative: 1 % (ref 0–1)
Eosinophils Absolute: 0.4 10*3/uL (ref 0.0–0.7)
HCT: 24.1 % — ABNORMAL LOW (ref 36.0–46.0)
Hemoglobin: 8.5 g/dL — ABNORMAL LOW (ref 12.0–15.0)
Lymphs Abs: 0.7 10*3/uL (ref 0.7–4.0)
MCH: 28.7 pg (ref 26.0–34.0)
MCHC: 35.3 g/dL (ref 30.0–36.0)
Monocytes Absolute: 0.4 10*3/uL (ref 0.1–1.0)
Monocytes Relative: 8 % (ref 3–12)
Neutro Abs: 3.8 10*3/uL (ref 1.7–7.7)
Neutrophils Relative %: 70 % (ref 43–77)
RBC: 2.96 MIL/uL — ABNORMAL LOW (ref 3.87–5.11)

## 2012-04-06 LAB — APTT: aPTT: 37 seconds (ref 24–37)

## 2012-04-06 LAB — COMPREHENSIVE METABOLIC PANEL
ALT: 31 U/L (ref 0–35)
AST: 42 U/L — ABNORMAL HIGH (ref 0–37)
Calcium: 9.5 mg/dL (ref 8.4–10.5)
Creatinine, Ser: 0.74 mg/dL (ref 0.50–1.10)
Sodium: 137 mEq/L (ref 135–145)
Total Protein: 6.3 g/dL (ref 6.0–8.3)

## 2012-04-06 LAB — TYPE AND SCREEN
ABO/RH(D): A POS
Antibody Screen: NEGATIVE

## 2012-04-06 LAB — PROTIME-INR
INR: 1.05 (ref 0.00–1.49)
Prothrombin Time: 13.6 seconds (ref 11.6–15.2)

## 2012-04-06 MED ORDER — SODIUM CHLORIDE 0.9 % IV SOLN
Freq: Once | INTRAVENOUS | Status: AC
  Administered 2012-04-06: 15:00:00 via INTRAVENOUS

## 2012-04-06 NOTE — ED Notes (Signed)
Received call from [place] in lab stating that the pt's CMET and Lipase are too lipemic to run, so they are being sent to [facility].  MD notified.

## 2012-04-06 NOTE — ED Notes (Signed)
Patient refused the in and out cath.

## 2012-04-06 NOTE — ED Notes (Signed)
PA at bedside for rectal exam.

## 2012-04-06 NOTE — ED Notes (Addendum)
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  Pt reports feeling fatigued, sore abdomen and pale lower eye lids.  Pt reports 1 unit  last blood transfusion about a month ago. Husband at bedside with patient.

## 2012-04-06 NOTE — ED Provider Notes (Signed)
History     CSN: [id number]  Arrival date & time 04/06/12  1356   First MD Initiated Contact with Patient 04/06/12 1409      Chief Complaint  Patient presents with  . Fatigue    (Consider location/radiation/quality/duration/timing/severity/associated sxs/prior treatment) Patient is a 72 y.o. female presenting with weakness. The history is provided by the patient.  Weakness Associated symptoms include fatigue and weakness. Pertinent negatives include no abdominal pain, arthralgias, chest pain, coughing, fever, myalgias, nausea, rash, urinary symptoms or vomiting. Associated symptoms comments: She reports generalized fatigue over the last several days and "uneasy" stomach. She says that she feels this way when her hemoglobin drops. She has been educated in looking at her conjunctival for pallor and states she is very pale now. No pain, fever, nausea or syncope/near-syncope. 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 .    Past Medical History  Diagnosis Date  . Anemia   . S/P endoscopy     with laser treatment  08/19/2006  . History of epistaxis   . Diabetes mellitus     insulin requiring.  Marland Kitchen Hypertension   . Hyperlipidemia   . Telangiectasia     Gastric   . Lichen planus     Both lower extremities  . GERD (gastroesophageal reflux disease)   . Heart murmur   . HTN (hypertension), benign 03/02/2012    Past Surgical History  Procedure Laterality Date  . Nose surgery    . Savory dilation  02/26/2011    Procedure: SAVORY DILATION;  Surgeon: [name], MD;  Location: [name];  Service: Endoscopy;  Laterality: N/A;  c-arm needed  . Esophagogastroduodenoscopy  02/26/2011    Procedure: ESOPHAGOGASTRODUODENOSCOPY (EGD);  Surgeon: [name], MD;  Location: [name];  Service: Endoscopy;  Laterality: N/A;    Family History  Problem Relation Age of Onset  . Breast cancer     . Malignant hyperthermia Neg Hx   . Stroke Father     History  Substance Use Topics  . Smoking status: Former Research scientist (life sciences)  . Smokeless tobacco: Never Used  . Alcohol Use: No    OB History   Grav Para Term Preterm Abortions TAB SAB Ect Mult Living                  Review of Systems  Constitutional: Positive for fatigue. Negative for fever and unexpected weight change.  Eyes:       See HPI.  Respiratory: Negative for cough.   Cardiovascular: Negative for chest pain.  Gastrointestinal: Negative for nausea, vomiting and abdominal pain.  Genitourinary: Negative for dysuria.  Musculoskeletal: Negative for myalgias and arthralgias.  Skin: Negative for rash.  Neurological: Positive for weakness.  Psychiatric/Behavioral: Negative for confusion.    Allergies  Aspirin  Home Medications   Current Outpatient Rx  Name  Route  Sig  Dispense  Refill  . amitriptyline (ELAVIL) 25 MG tablet   Oral   Take 25 mg by mouth At bedtime as needed.         . enalapril (VASOTEC) 20 MG tablet   Oral   Take 20 mg by mouth daily.         . insulin glargine (LANTUS) 100 UNIT/ML injection   Subcutaneous   Inject 40 Units into the skin at bedtime.         . metFORMIN (GLUCOPHAGE) 1000 MG tablet   Oral  Take 1,000 mg by mouth daily with breakfast.          . pantoprazole (PROTONIX) 40 MG tablet   Oral   Take 40 mg by mouth at bedtime.          . sodium chloride (OCEAN) 0.65 % nasal spray   Nasal   Place 1 spray into the nose daily.          . vitamin B-12 (CYANOCOBALAMIN) 1000 MCG tablet   Oral   Take 1,000 mcg by mouth daily with breakfast.          . Vitamin D, Ergocalciferol, (DRISDOL) 50000 UNITS CAPS   Oral   Take 50,000 Units by mouth every 7 (seven) days. On tuesday           There were no vitals taken for this visit.  Physical Exam  Constitutional: She is oriented to person, place, and time. She appears well-developed and well-nourished. No distress.   HENT:  Head: Normocephalic.  Mouth/Throat: Oropharynx is clear and moist.  Eyes: Pupils are equal, round, and reactive to light.  Marked conjunctival pallor  Neck: Normal range of motion.  Cardiovascular: Normal rate and regular rhythm.   No murmur heard. Pulmonary/Chest: Effort normal. She has no wheezes. She has no rales.  Abdominal: Soft. There is no tenderness.  Musculoskeletal: Normal range of motion.  Neurological: She is alert and oriented to person, place, and time.  Skin: Skin is warm and dry.  Psychiatric: She has a normal mood and affect.    ED Course  Procedures (including critical care time)  Labs Reviewed  COMPREHENSIVE METABOLIC PANEL  CBC WITH DIFFERENTIAL  LIPASE, BLOOD  URINALYSIS, ROUTINE W REFLEX MICROSCOPIC  APTT  PROTIME-INR  TYPE AND SCREEN   Results for orders placed during the hospital encounter of 04/06/12  CBC WITH DIFFERENTIAL      Result Value Range   WBC 5.4  4.0 - 10.5 K/uL   RBC 2.96 (*) 3.87 - 5.11 MIL/uL   Hemoglobin 8.5 (*) 12.0 - 15.0 g/dL   HCT 24.1 (*) 36.0 - 46.0 %   MCV 81.4  78.0 - 100.0 fL   MCH 28.7  26.0 - 34.0 pg   MCHC 35.3  30.0 - 36.0 g/dL   RDW 17.0 (*) 11.5 - 15.5 %   Platelets 184  150 - 400 K/uL   Neutrophils Relative 70  43 - 77 %   Neutro Abs 3.8  1.7 - 7.7 K/uL   Lymphocytes Relative 13  12 - 46 %   Lymphs Abs 0.7  0.7 - 4.0 K/uL   Monocytes Relative 8  3 - 12 %   Monocytes Absolute 0.4  0.1 - 1.0 K/uL   Eosinophils Relative 8 (*) 0 - 5 %   Eosinophils Absolute 0.4  0.0 - 0.7 K/uL   Basophils Relative 1  0 - 1 %   Basophils Absolute 0.0  0.0 - 0.1 K/uL  URINALYSIS, ROUTINE W REFLEX MICROSCOPIC      Result Value Range   Color, Urine YELLOW  YELLOW   APPearance CLEAR  CLEAR   Specific Gravity, Urine 1.008  1.005 - 1.030   pH 6.0  5.0 - 8.0   Glucose, UA 500 (*) NEGATIVE mg/dL   Hgb urine dipstick NEGATIVE  NEGATIVE   Bilirubin Urine NEGATIVE  NEGATIVE   Ketones, ur NEGATIVE  NEGATIVE mg/dL   Protein, ur  NEGATIVE  NEGATIVE mg/dL   Urobilinogen, UA 0.2  0.0 - 1.0 mg/dL  Nitrite NEGATIVE  NEGATIVE   Leukocytes, UA NEGATIVE  NEGATIVE  APTT      Result Value Range   aPTT 37  24 - 37 seconds  PROTIME-INR      Result Value Range   Prothrombin Time 13.6  11.6 - 15.2 seconds   INR 1.05  0.00 - 1.49  OCCULT BLOOD, POC DEVICE      Result Value Range   Fecal Occult Bld POSITIVE (*) NEGATIVE  TYPE AND SCREEN      Result Value Range   ABO/RH(D) A POS     Antibody Screen NEG     Sample Expiration 04/09/2012      No results found.   No diagnosis found. 1. Anemia  2. H/o HHT   MDM  Discussed with Dr. [name]. Patient is close to baseline hgb and is stable. She can be discharged home and should call Dr. [name] tomorrow to arrange an office appointment for iron supplementation and blood recheck. Patient's VSS, she is feeling well. She is stable for discharge.         Dewaine Oats, PA-C 04/06/12 1648

## 2012-04-07 ENCOUNTER — Telehealth: Payer: Self-pay | Admitting: *Deleted

## 2012-04-07 NOTE — Telephone Encounter (Signed)
PT.'S LAST GOOD BOWEL MOVEMENT WAS 04/03/12. SHE HAS BEEN MOVING HER BOWELS SOME BUT DECIDED TO TAKE SOME MIRALAX ON 04/06/12. SHE HAD A BOWEL MOVEMENT BUT HER STOMACH DID NOT "FEEL RIGHT" SO SHE WENT TO THE EMERGENCY ROOM. WHEN PT. WAS DISCHARGED FROM THE EMERGENCY ROOM, SHE WAS INSTRUCTED TO FOLLOW UP WITH DR.GRANFORTUNA. PT. HAD ANOTHER BOWEL MOVEMENT YESTERDAY BUT NO BOWEL MOVEMENT TODAY. PT. HAS NOT NOTED ANY BLOOD IN ANY OF HER STOOLS. SHE IS FEELING BETTER TODAY AND WILL BE IN CLASS UNTIL 3:30PM TODAY. THIS NOTE, EMERGENCY ROOM NOTES, AND DR.GRANFORTUNA'S 03/02/12 DICTATION GIVEN TO DR.SHERRILL.

## 2012-04-07 NOTE — ED Provider Notes (Signed)
Medical screening examination/treatment/procedure(s) were performed by non-physician practitioner and as supervising physician I was immediately available for consultation/collaboration.   Maudry Diego, MD 04/07/12 [phone]

## 2012-04-07 NOTE — Telephone Encounter (Signed)
DR.SHERRILL REVIEW INFORMATION. HE INSTRUCTED THESE NOTES TO BE PLACED IN DR.GRANFORTUNA'S ACTIVE WORK BOX FOR HIS REVIEW. THIS WAS DONE.

## 2012-04-08 ENCOUNTER — Telehealth: Payer: Self-pay | Admitting: Oncology

## 2012-04-08 NOTE — Telephone Encounter (Signed)
S/w the pt and she is aware of her iron infusion appt on 04/11/2012@10 :00am

## 2012-04-11 ENCOUNTER — Ambulatory Visit (HOSPITAL_BASED_OUTPATIENT_CLINIC_OR_DEPARTMENT_OTHER): Payer: Medicare Other

## 2012-04-11 VITALS — BP 133/56 | HR 54 | Temp 98.3°F

## 2012-04-11 MED ORDER — SODIUM CHLORIDE 0.9 % IV SOLN
1020.0000 mg | Freq: Once | INTRAVENOUS | Status: AC
  Administered 2012-04-11: 1020 mg via INTRAVENOUS
  Filled 2012-04-11: qty 34

## 2012-04-11 NOTE — Patient Instructions (Addendum)
Ferumoxytol injection What is this medicine? FERUMOXYTOL is an iron complex. Iron is used to make healthy red blood cells, which carry oxygen and nutrients throughout the body. This medicine is used to treat iron deficiency anemia in people with chronic kidney disease. This medicine may be used for other purposes; ask your health care provider or pharmacist if you have questions. What should I tell my health care provider before I take this medicine? They need to know if you have any of these conditions: -anemia not caused by low iron levels -high levels of iron in the blood -magnetic resonance imaging (MRI) test scheduled -an unusual or allergic reaction to iron, other medicines, foods, dyes, or preservatives -pregnant or trying to get pregnant -breast-feeding How should I use this medicine? This medicine is for infusion into a vein. It is given by a health care professional in a hospital or clinic setting. Talk to your pediatrician regarding the use of this medicine in children. Special care may be needed. Overdosage: If you think you've taken too much of this medicine contact a poison control center or emergency room at once. Overdosage: If you think you have taken too much of this medicine contact a poison control center or emergency room at once. NOTE: This medicine is only for you. Do not share this medicine with others. What if I miss a dose? It is important not to miss your dose. Call your doctor or health care professional if you are unable to keep an appointment. What may interact with this medicine? This medicine may interact with the following medications: -other iron products This list may not describe all possible interactions. Give your health care provider a list of all the medicines, herbs, non-prescription drugs, or dietary supplements you use. Also tell them if you smoke, drink alcohol, or use illegal drugs. Some items may interact with your medicine. What should I watch  for while using this medicine? Visit your doctor or healthcare professional regularly. Tell your doctor or healthcare professional if your symptoms do not start to get better or if they get worse. You may need blood work done while you are taking this medicine. You may need to follow a special diet. Talk to your doctor. Foods that contain iron include: whole grains/cereals, dried fruits, beans, or peas, leafy green vegetables, and organ meats (liver, kidney). What side effects may I notice from receiving this medicine? Side effects that you should report to your doctor or health care professional as soon as possible: -allergic reactions like skin rash, itching or hives, swelling of the face, lips, or tongue -breathing problems -changes in blood pressure -feeling faint or lightheaded, falls -fever or chills -flushing, sweating, or hot feelings -swelling of the ankles or feet Side effects that usually do not require medical attention (Report these to your doctor or health care professional if they continue or are bothersome.): -diarrhea -headache -nausea, vomiting -stomach pain This list may not describe all possible side effects. Call your doctor for medical advice about side effects. You may report side effects to FDA at 1-800-FDA-1088. Where should I keep my medicine? This drug is given in a hospital or clinic and will not be stored at home. NOTE: This sheet is a summary. It may not cover all possible information. If you have questions about this medicine, talk to your doctor, pharmacist, or health care provider.  2012, Elsevier/Gold Standard. (10/19/2007 9:48:25 PM) 

## 2012-04-25 ENCOUNTER — Other Ambulatory Visit (HOSPITAL_BASED_OUTPATIENT_CLINIC_OR_DEPARTMENT_OTHER): Payer: Medicare Other | Admitting: Lab

## 2012-04-25 LAB — CBC WITH DIFFERENTIAL/PLATELET
BASO%: 0.4 % (ref 0.0–2.0)
EOS%: 6.8 % (ref 0.0–7.0)
HCT: 27.2 % — ABNORMAL LOW (ref 34.8–46.6)
LYMPH%: 19.8 % (ref 14.0–49.7)
MCH: 29.1 pg (ref 25.1–34.0)
MCHC: 32.9 g/dL (ref 31.5–36.0)
MCV: 88.5 fL (ref 79.5–101.0)
MONO#: 0.5 10*3/uL (ref 0.1–0.9)
MONO%: 9.9 % (ref 0.0–14.0)
NEUT%: 63.1 % (ref 38.4–76.8)
Platelets: 174 10*3/uL (ref 145–400)
RBC: 3.07 10*6/uL — ABNORMAL LOW (ref 3.70–5.45)
WBC: 4.7 10*3/uL (ref 3.9–10.3)

## 2012-05-23 ENCOUNTER — Other Ambulatory Visit (HOSPITAL_BASED_OUTPATIENT_CLINIC_OR_DEPARTMENT_OTHER): Payer: Medicare Other | Admitting: Lab

## 2012-05-23 ENCOUNTER — Telehealth: Payer: Self-pay | Admitting: *Deleted

## 2012-05-23 DIAGNOSIS — I780 Hereditary hemorrhagic telangiectasia: Secondary | ICD-10-CM

## 2012-05-23 DIAGNOSIS — D509 Iron deficiency anemia, unspecified: Secondary | ICD-10-CM

## 2012-05-23 DIAGNOSIS — I10 Essential (primary) hypertension: Secondary | ICD-10-CM

## 2012-05-23 DIAGNOSIS — K31819 Angiodysplasia of stomach and duodenum without bleeding: Secondary | ICD-10-CM

## 2012-05-23 LAB — CBC WITH DIFFERENTIAL/PLATELET
BASO%: 0.4 % (ref 0.0–2.0)
EOS%: 6.9 % (ref 0.0–7.0)
HCT: 26 % — ABNORMAL LOW (ref 34.8–46.6)
MCH: 25.2 pg (ref 25.1–34.0)
MCHC: 30.4 g/dL — ABNORMAL LOW (ref 31.5–36.0)
MONO#: 0.6 10*3/uL (ref 0.1–0.9)
NEUT%: 62.9 % (ref 38.4–76.8)
RBC: 3.14 10*6/uL — ABNORMAL LOW (ref 3.70–5.45)
WBC: 5.2 10*3/uL (ref 3.9–10.3)
lymph#: 0.9 10*3/uL (ref 0.9–3.3)
nRBC: 0 % (ref 0–0)

## 2012-05-23 NOTE — Telephone Encounter (Signed)
Patient here for CBC.  Hgb 7.9.  Spoke with patient.  She states she feels fine.  Denies headache, palpitations, SOB.   She will return Monday 4/21 to see Dr. [name].  Let her know that if she starts having any of these sx to give [place] a call.  Wrote down our phone number and Dr. Azucena Freed name.  Discussed with Dr. [name]. He will review and see if she needs repeat labs on 05/30/12

## 2012-05-25 ENCOUNTER — Ambulatory Visit (HOSPITAL_BASED_OUTPATIENT_CLINIC_OR_DEPARTMENT_OTHER): Payer: Medicare Other | Admitting: Lab

## 2012-05-25 ENCOUNTER — Other Ambulatory Visit: Payer: Self-pay | Admitting: *Deleted

## 2012-05-25 ENCOUNTER — Telehealth: Payer: Self-pay | Admitting: *Deleted

## 2012-05-25 DIAGNOSIS — D509 Iron deficiency anemia, unspecified: Secondary | ICD-10-CM | POA: Insufficient documentation

## 2012-05-25 LAB — CBC WITH DIFFERENTIAL/PLATELET
EOS%: 3.2 % (ref 0.0–7.0)
Eosinophils Absolute: 0.2 10*3/uL (ref 0.0–0.5)
MCH: 25.7 pg (ref 25.1–34.0)
MCV: 80.6 fL (ref 79.5–101.0)
MONO%: 9.3 % (ref 0.0–14.0)
NEUT#: 4.2 10*3/uL (ref 1.5–6.5)
RBC: 2.86 10*6/uL — ABNORMAL LOW (ref 3.70–5.45)
RDW: 19 % — ABNORMAL HIGH (ref 11.2–14.5)
lymph#: 0.7 10*3/uL — ABNORMAL LOW (ref 0.9–3.3)

## 2012-05-25 LAB — PREPARE RBC (CROSSMATCH)

## 2012-05-25 NOTE — Telephone Encounter (Signed)
Pt called & reports that she thinks that her blood has dropped more b/c she is very tired & can't do anything.  She felt light-headed this am & is SOB with exertion.  She reports no chest pain or feet & ankle swelling.  Discussed with Dr [name] & pt will come in for cbc & hold blood bank.  She knows to wait for nurse to discuss with her.  Set up for blood for tomorrow @ 11:45 if needed.

## 2012-05-26 ENCOUNTER — Ambulatory Visit (HOSPITAL_BASED_OUTPATIENT_CLINIC_OR_DEPARTMENT_OTHER): Payer: Medicare Other

## 2012-05-26 ENCOUNTER — Encounter (HOSPITAL_COMMUNITY)
Admission: RE | Admit: 2012-05-26 | Discharge: 2012-05-26 | Disposition: A | Discharge status: Home / Self Care | Payer: Medicare Other | Source: Ambulatory Visit | Attending: Oncology | Admitting: Oncology

## 2012-05-26 VITALS — BP 127/50 | HR 65 | Temp 98.3°F | Resp 18

## 2012-05-26 DIAGNOSIS — D509 Iron deficiency anemia, unspecified: Secondary | ICD-10-CM

## 2012-05-26 DIAGNOSIS — I780 Hereditary hemorrhagic telangiectasia: Secondary | ICD-10-CM

## 2012-05-26 DIAGNOSIS — D500 Iron deficiency anemia secondary to blood loss (chronic): Secondary | ICD-10-CM

## 2012-05-26 LAB — HOLD TUBE, BLOOD BANK

## 2012-05-26 MED ORDER — SODIUM CHLORIDE 0.9 % IV SOLN
250.0000 mL | Freq: Once | INTRAVENOUS | Status: AC
  Administered 2012-05-26: 250 mL via INTRAVENOUS

## 2012-05-26 NOTE — Patient Instructions (Addendum)
Blood Transfusion Information WHAT IS A BLOOD TRANSFUSION? A transfusion is the replacement of blood or some of its parts. Blood is made up of multiple cells which provide different functions.  Red blood cells carry oxygen and are used for blood loss replacement.  White blood cells fight against infection.  Platelets control bleeding.  Plasma helps clot blood.  Other blood products are available for specialized needs, such as hemophilia or other clotting disorders. BEFORE THE TRANSFUSION  Who gives blood for transfusions?   You may be able to donate blood to be used at a later date on yourself (autologous donation).  Relatives can be asked to donate blood. This is generally not any safer than if you have received blood from a stranger. The same precautions are taken to ensure safety when a relative's blood is donated.  Healthy volunteers who are fully evaluated to make sure their blood is safe. This is blood bank blood. Transfusion therapy is the safest it has ever been in the practice of medicine. Before blood is taken from a donor, a complete history is taken to make sure that person has no history of diseases nor engages in risky social behavior (examples are intravenous drug use or sexual activity with multiple partners). The donor's travel history is screened to minimize risk of transmitting infections, such as malaria. The donated blood is tested for signs of infectious diseases, such as HIV and hepatitis. The blood is then tested to be sure it is compatible with you in order to minimize the chance of a transfusion reaction. If you or a relative donates blood, this is often done in anticipation of surgery and is not appropriate for emergency situations. It takes many days to process the donated blood. RISKS AND COMPLICATIONS Although transfusion therapy is very safe and saves many lives, the main dangers of transfusion include:   Getting an infectious disease.  Developing a  transfusion reaction. This is an allergic reaction to something in the blood you were given. Every precaution is taken to prevent this. The decision to have a blood transfusion has been considered carefully by your caregiver before blood is given. Blood is not given unless the benefits outweigh the risks. AFTER THE TRANSFUSION  Right after receiving a blood transfusion, you will usually feel much better and more energetic. This is especially true if your red blood cells have gotten low (anemic). The transfusion raises the level of the red blood cells which carry oxygen, and this usually causes an energy increase.  The nurse administering the transfusion will monitor you carefully for complications. HOME CARE INSTRUCTIONS  No special instructions are needed after a transfusion. You may find your energy is better. Speak with your caregiver about any limitations on activity for underlying diseases you may have. SEEK MEDICAL CARE IF:   Your condition is not improving after your transfusion.  You develop redness or irritation at the intravenous (IV) site. SEEK IMMEDIATE MEDICAL CARE IF:  Any of the following symptoms occur over the next 12 hours:  Shaking chills.  You have a temperature by mouth above 102 F (38.9 C), not controlled by medicine.  Chest, back, or muscle pain.  People around you feel you are not acting correctly or are confused.  Shortness of breath or difficulty breathing.  Dizziness and fainting.  You get a rash or develop hives.  You have a decrease in urine output.  Your urine turns a dark color or changes to pink, red, or brown. Any of the following   symptoms occur over the next 10 days:  You have a temperature by mouth above 102 F (38.9 C), not controlled by medicine.  Shortness of breath.  Weakness after normal activity.  The white part of the eye turns yellow (jaundice).  You have a decrease in the amount of urine or are urinating less often.  Your  urine turns a dark color or changes to pink, red, or brown. Document Released: 01/24/2000 Document Revised: 04/20/2011 Document Reviewed: 09/12/2007 ExitCare Patient Information 2013 [employer].  

## 2012-05-27 LAB — TYPE AND SCREEN
Antibody Screen: NEGATIVE
Unit division: 0

## 2012-05-30 ENCOUNTER — Ambulatory Visit (HOSPITAL_BASED_OUTPATIENT_CLINIC_OR_DEPARTMENT_OTHER): Payer: Medicare Other | Admitting: Oncology

## 2012-05-30 ENCOUNTER — Telehealth: Payer: Self-pay | Admitting: Oncology

## 2012-05-30 VITALS — BP 141/39 | HR 83 | Temp 98.8°F | Resp 18 | Ht 63.0 in | Wt 155.8 lb

## 2012-05-30 DIAGNOSIS — K31819 Angiodysplasia of stomach and duodenum without bleeding: Secondary | ICD-10-CM

## 2012-05-30 DIAGNOSIS — I780 Hereditary hemorrhagic telangiectasia: Secondary | ICD-10-CM

## 2012-05-30 DIAGNOSIS — L439 Lichen planus, unspecified: Secondary | ICD-10-CM

## 2012-05-30 DIAGNOSIS — D62 Acute posthemorrhagic anemia: Secondary | ICD-10-CM

## 2012-05-30 DIAGNOSIS — K922 Gastrointestinal hemorrhage, unspecified: Secondary | ICD-10-CM

## 2012-05-30 DIAGNOSIS — D509 Iron deficiency anemia, unspecified: Secondary | ICD-10-CM

## 2012-05-30 NOTE — Telephone Encounter (Signed)
gv and printed appt sched and avs for May thru OCT

## 2012-06-01 ENCOUNTER — Telehealth: Payer: Self-pay | Admitting: Oncology

## 2012-06-01 ENCOUNTER — Telehealth: Payer: Self-pay | Admitting: *Deleted

## 2012-06-01 ENCOUNTER — Other Ambulatory Visit: Payer: Self-pay | Admitting: Oncology

## 2012-06-01 ENCOUNTER — Other Ambulatory Visit (HOSPITAL_BASED_OUTPATIENT_CLINIC_OR_DEPARTMENT_OTHER): Payer: Medicare Other | Admitting: Lab

## 2012-06-01 DIAGNOSIS — K31819 Angiodysplasia of stomach and duodenum without bleeding: Secondary | ICD-10-CM

## 2012-06-01 DIAGNOSIS — D509 Iron deficiency anemia, unspecified: Secondary | ICD-10-CM

## 2012-06-01 DIAGNOSIS — Q2733 Arteriovenous malformation of digestive system vessel: Secondary | ICD-10-CM

## 2012-06-01 DIAGNOSIS — I780 Hereditary hemorrhagic telangiectasia: Secondary | ICD-10-CM

## 2012-06-01 DIAGNOSIS — D62 Acute posthemorrhagic anemia: Secondary | ICD-10-CM

## 2012-06-01 DIAGNOSIS — D649 Anemia, unspecified: Secondary | ICD-10-CM

## 2012-06-01 LAB — FERRITIN: Ferritin: 9 ng/mL — ABNORMAL LOW (ref 10–291)

## 2012-06-01 LAB — CBC WITH DIFFERENTIAL/PLATELET
BASO%: 0.4 % (ref 0.0–2.0)
Eosinophils Absolute: 0.5 10*3/uL (ref 0.0–0.5)
LYMPH%: 7 % — ABNORMAL LOW (ref 14.0–49.7)
MCHC: 32.4 g/dL (ref 31.5–36.0)
MONO#: 1 10*3/uL — ABNORMAL HIGH (ref 0.1–0.9)
NEUT#: 7.6 10*3/uL — ABNORMAL HIGH (ref 1.5–6.5)
Platelets: 178 10*3/uL (ref 145–400)
RBC: 3.45 10*6/uL — ABNORMAL LOW (ref 3.70–5.45)
WBC: 9.8 10*3/uL (ref 3.9–10.3)
lymph#: 0.7 10*3/uL — ABNORMAL LOW (ref 0.9–3.3)

## 2012-06-01 LAB — IRON AND TIBC
%SAT: 6 % — ABNORMAL LOW (ref 20–55)
Iron: 19 ug/dL — ABNORMAL LOW (ref 42–145)
TIBC: 310 ug/dL (ref 250–470)
UIBC: 291 ug/dL (ref 125–400)

## 2012-06-01 NOTE — Progress Notes (Signed)
Hematology and Oncology Follow Up Visit  ZIYONNA NOACK [id number] [birth year] 72 y.o. 06/01/2012 11:58 AM   Principle Diagnosis: Encounter Diagnoses  Name Primary?  . Osler-Weber-Rendu disease Yes  . Gastric AVM   . ANEMIA, IRON DEFICIENCY NOS   . ANEMIA, ACUTE BLOOD LOSS      Interim History:  I will be assuming the hematology care for this patient since Dr. Jenetta Downer. left the practice.  Pleasant 72 year old woman with history of hereditary hemorrhagic telangiectasias. She has ongoing blood loss from mucosal surfaces with episodes of epistaxis occurring about twice a month. 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 She has had intermittent laser coagulation of actively bleeding lesions. Most recent upper endoscopy done 02/26/2011 by Dr. Nigel Mormon.  She has required intermittent blood transfusion support and parenteral iron infusions. . She has a sister and a brother who are also affected. Please see my summary note dated 03/02/2012 for additional details of her past and recent medical history.  She's had no major bleeding since her visit here in January. She denies any hematochezia or melena at this time. Her hemoglobin did drop to 7.4 on lab done in anticipation of today's visit on April 16 and she did require a blood transfusion.  Medications: reviewed  Allergies:  Allergies  Allergen Reactions  . Aspirin Nausea Only    Review of Systems: Constitutional:   No constitutional symptoms Respiratory: No cough or dyspnea Cardiovascular: No chest pain or palpitations  Gastrointestinal: No abdominal pain, no hematochezia or melena Genito-Urinary: No hematuria Musculoskeletal: No muscle bone or joint pain Neurologic: No headache or change in vision Skin: No new skin lesions Remaining ROS negative.  Physical Exam: Blood pressure 141/39, pulse 83, temperature 98.8 F (37.1 C), temperature source Oral, resp. rate 18, height 5\' 3"  (1.6 m), weight  155 lb 12.8 oz (70.67 kg). Wt Readings from Last 3 Encounters:  05/30/12 155 lb 12.8 oz (70.67 kg)  03/02/12 157 lb (71.215 kg)  12/30/11 155 lb 6.4 oz (70.489 kg)     General appearance: Well-nourished African American woman HENNT: Pharynx no erythema or exudate. 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  Lab Results: Lab Results  Component Value Date   WBC 9.8 06/01/2012   HGB 9.0* 06/01/2012   HCT 27.7* 06/01/2012   MCV 80.2 06/01/2012   PLT 178 06/01/2012     Chemistry      Component Value Date/Time   NA 137 04/06/2012 1440   NA 142 12/28/2011 0825   K 4.2 04/06/2012 1440   K 3.9 12/28/2011 0825   CL 102 04/06/2012 1440   CL 111* 12/28/2011 0825   CO2 23 04/06/2012 1440   CO2 26 12/28/2011 0825   BUN 17 04/06/2012 1440   BUN 18.0 12/28/2011 0825   CREATININE 0.74 04/06/2012 1440   CREATININE 0.7 12/28/2011 0825      Component Value Date/Time   CALCIUM 9.5 04/06/2012 1440   CALCIUM 9.6 12/28/2011 0825   ALKPHOS 108 04/06/2012 1440   ALKPHOS 93 12/28/2011 0825   AST 42* 04/06/2012 1440   AST 42* 12/28/2011 0825   ALT 31 04/06/2012 1440  ALT 37 12/28/2011 0825   BILITOT 0.2* 04/06/2012 1440   BILITOT 0.37 12/28/2011 0825      Impression and Plan:  #1. HHT  We will continue to monitor blood counts on a regular basis. Blood transfusions and parenteral iron infusions as indicated.  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  #3. Essential hypertension   #4. Insulin-dependent diabetes   #5. Lichen planus   CC:.    [name], MD 4/23/201411:58 AM

## 2012-06-01 NOTE — Telephone Encounter (Signed)
gv and printed pt appt for April...michelle add iron for friday

## 2012-06-01 NOTE — Telephone Encounter (Signed)
Per staff phone call and POF I have schedueld appts.  JMW  

## 2012-06-03 ENCOUNTER — Ambulatory Visit (HOSPITAL_BASED_OUTPATIENT_CLINIC_OR_DEPARTMENT_OTHER): Payer: Medicare Other

## 2012-06-03 VITALS — BP 158/59 | HR 72 | Temp 98.8°F | Resp 18

## 2012-06-03 DIAGNOSIS — D509 Iron deficiency anemia, unspecified: Secondary | ICD-10-CM

## 2012-06-03 DIAGNOSIS — D649 Anemia, unspecified: Secondary | ICD-10-CM

## 2012-06-03 DIAGNOSIS — I780 Hereditary hemorrhagic telangiectasia: Secondary | ICD-10-CM

## 2012-06-03 MED ORDER — HEPARIN SOD (PORK) LOCK FLUSH 100 UNIT/ML IV SOLN
500.0000 [IU] | Freq: Once | INTRAVENOUS | Status: DC | PRN
  Filled 2012-06-03: qty 5

## 2012-06-03 MED ORDER — SODIUM CHLORIDE 0.9 % IV SOLN
1020.0000 mg | Freq: Once | INTRAVENOUS | Status: AC
  Administered 2012-06-03: 1020 mg via INTRAVENOUS
  Filled 2012-06-03: qty 34

## 2012-06-03 MED ORDER — SODIUM CHLORIDE 0.9 % IV SOLN
INTRAVENOUS | Status: DC
  Administered 2012-06-03: 09:00:00 via INTRAVENOUS

## 2012-06-03 NOTE — Patient Instructions (Addendum)
Ferumoxytol injection What is this medicine? FERUMOXYTOL is an iron complex. Iron is used to make healthy red blood cells, which carry oxygen and nutrients throughout the body. This medicine is used to treat iron deficiency anemia in people with chronic kidney disease. This medicine may be used for other purposes; ask your health care provider or pharmacist if you have questions. What should I tell my health care provider before I take this medicine? They need to know if you have any of these conditions: -anemia not caused by low iron levels -high levels of iron in the blood -magnetic resonance imaging (MRI) test scheduled -an unusual or allergic reaction to iron, other medicines, foods, dyes, or preservatives -pregnant or trying to get pregnant -breast-feeding How should I use this medicine? This medicine is for infusion into a vein. It is given by a health care professional in a hospital or clinic setting. Talk to your pediatrician regarding the use of this medicine in children. Special care may be needed. Overdosage: If you think you've taken too much of this medicine contact a poison control center or emergency room at once. Overdosage: If you think you have taken too much of this medicine contact a poison control center or emergency room at once. NOTE: This medicine is only for you. Do not share this medicine with others. What if I miss a dose? It is important not to miss your dose. Call your doctor or health care professional if you are unable to keep an appointment. What may interact with this medicine? This medicine may interact with the following medications: -other iron products This list may not describe all possible interactions. Give your health care provider a list of all the medicines, herbs, non-prescription drugs, or dietary supplements you use. Also tell them if you smoke, drink alcohol, or use illegal drugs. Some items may interact with your medicine. What should I watch  for while using this medicine? Visit your doctor or healthcare professional regularly. Tell your doctor or healthcare professional if your symptoms do not start to get better or if they get worse. You may need blood work done while you are taking this medicine. You may need to follow a special diet. Talk to your doctor. Foods that contain iron include: whole grains/cereals, dried fruits, beans, or peas, leafy green vegetables, and organ meats (liver, kidney). What side effects may I notice from receiving this medicine? Side effects that you should report to your doctor or health care professional as soon as possible: -allergic reactions like skin rash, itching or hives, swelling of the face, lips, or tongue -breathing problems -changes in blood pressure -feeling faint or lightheaded, falls -fever or chills -flushing, sweating, or hot feelings -swelling of the ankles or feet Side effects that usually do not require medical attention (Report these to your doctor or health care professional if they continue or are bothersome.): -diarrhea -headache -nausea, vomiting -stomach pain This list may not describe all possible side effects. Call your doctor for medical advice about side effects. You may report side effects to FDA at 1-800-FDA-1088. Where should I keep my medicine? This drug is given in a hospital or clinic and will not be stored at home. NOTE: This sheet is a summary. It may not cover all possible information. If you have questions about this medicine, talk to your doctor, pharmacist, or health care provider.  2012, Elsevier/Gold Standard. (10/19/2007 9:48:25 PM) 

## 2012-06-04 ENCOUNTER — Emergency Department (HOSPITAL_COMMUNITY): Payer: Medicare Other

## 2012-06-04 ENCOUNTER — Encounter (HOSPITAL_COMMUNITY): Payer: Self-pay | Admitting: *Deleted

## 2012-06-04 ENCOUNTER — Emergency Department (HOSPITAL_COMMUNITY)
Admission: EM | Admit: 2012-06-04 | Discharge: 2012-06-04 | Disposition: A | Discharge status: Home / Self Care | Payer: Medicare Other | Attending: Emergency Medicine | Admitting: Emergency Medicine

## 2012-06-04 DIAGNOSIS — K219 Gastro-esophageal reflux disease without esophagitis: Secondary | ICD-10-CM | POA: Insufficient documentation

## 2012-06-04 DIAGNOSIS — I10 Essential (primary) hypertension: Secondary | ICD-10-CM | POA: Insufficient documentation

## 2012-06-04 DIAGNOSIS — Z872 Personal history of diseases of the skin and subcutaneous tissue: Secondary | ICD-10-CM | POA: Insufficient documentation

## 2012-06-04 DIAGNOSIS — R011 Cardiac murmur, unspecified: Secondary | ICD-10-CM | POA: Insufficient documentation

## 2012-06-04 DIAGNOSIS — Z794 Long term (current) use of insulin: Secondary | ICD-10-CM | POA: Insufficient documentation

## 2012-06-04 DIAGNOSIS — Z862 Personal history of diseases of the blood and blood-forming organs and certain disorders involving the immune mechanism: Secondary | ICD-10-CM | POA: Insufficient documentation

## 2012-06-04 DIAGNOSIS — J029 Acute pharyngitis, unspecified: Secondary | ICD-10-CM | POA: Insufficient documentation

## 2012-06-04 DIAGNOSIS — Z87891 Personal history of nicotine dependence: Secondary | ICD-10-CM | POA: Insufficient documentation

## 2012-06-04 DIAGNOSIS — Z8719 Personal history of other diseases of the digestive system: Secondary | ICD-10-CM | POA: Insufficient documentation

## 2012-06-04 DIAGNOSIS — E785 Hyperlipidemia, unspecified: Secondary | ICD-10-CM | POA: Insufficient documentation

## 2012-06-04 DIAGNOSIS — J209 Acute bronchitis, unspecified: Secondary | ICD-10-CM | POA: Insufficient documentation

## 2012-06-04 DIAGNOSIS — E119 Type 2 diabetes mellitus without complications: Secondary | ICD-10-CM | POA: Insufficient documentation

## 2012-06-04 DIAGNOSIS — Z79899 Other long term (current) drug therapy: Secondary | ICD-10-CM | POA: Insufficient documentation

## 2012-06-04 DIAGNOSIS — J40 Bronchitis, not specified as acute or chronic: Secondary | ICD-10-CM

## 2012-06-04 MED ORDER — AZITHROMYCIN 250 MG PO TABS: ORAL_TABLET | ORAL | Status: DC

## 2012-06-04 NOTE — ED Provider Notes (Signed)
History     CSN: [id number]  Arrival date & time 06/04/12  1947   First MD Initiated Contact with Patient 06/04/12 2014      Chief Complaint  Patient presents with  . Sore Throat  . Cough    (Consider location/radiation/quality/duration/timing/severity/associated sxs/prior treatment) Patient is a 72 y.o. female presenting with pharyngitis and cough. The history is provided by the patient.  Sore Throat  Cough  patient presents with five-day history of cough productive of yellow sputum. She initially did have a sore throat since resolved. She denies fever or chills. No anginal type chest pain. Denies abdominal pain or urinary symptoms. Has used over-the-counter medications without relief. Denies any ear pain. No headache or neck pain or photophobia. Symptoms have been persistent. Denies any orthopnea or severe dyspnea on exertion. No rashes reported.  Past Medical History  Diagnosis Date  . Anemia   . S/P endoscopy     with laser treatment  08/19/2006  . History of epistaxis   . Diabetes mellitus     insulin requiring.  Marland Kitchen Hypertension   . Hyperlipidemia   . Telangiectasia     Gastric   . Lichen planus     Both lower extremities  . GERD (gastroesophageal reflux disease)   . Heart murmur   . HTN (hypertension), benign 03/02/2012    Past Surgical History  Procedure Laterality Date  . Nose surgery    . Savory dilation  02/26/2011    Procedure: SAVORY DILATION;  Surgeon: [name], MD;  Location: [name];  Service: Endoscopy;  Laterality: N/A;  c-arm needed  . Esophagogastroduodenoscopy  02/26/2011    Procedure: ESOPHAGOGASTRODUODENOSCOPY (EGD);  Surgeon: [name], MD;  Location: [name];  Service: Endoscopy;  Laterality: N/A;    Family History  Problem Relation Age of Onset  . Breast cancer    . Malignant hyperthermia Neg Hx   . Stroke Father     History  Substance Use Topics  . Smoking status: Former Research scientist (life sciences)  . Smokeless tobacco: Never Used  .  Alcohol Use: No    OB History   Grav Para Term Preterm Abortions TAB SAB Ect Mult Living                  Review of Systems  Respiratory: Positive for cough.   All other systems reviewed and are negative.    Allergies  Aspirin  Home Medications   Current Outpatient Rx  Name  Route  Sig  Dispense  Refill  . amitriptyline (ELAVIL) 25 MG tablet   Oral   Take 25 mg by mouth At bedtime as needed.         . enalapril (VASOTEC) 20 MG tablet   Oral   Take 20 mg by mouth daily.         . insulin glargine (LANTUS) 100 UNIT/ML injection   Subcutaneous   Inject 40 Units into the skin at bedtime.         . metFORMIN (GLUCOPHAGE) 1000 MG tablet   Oral   Take 1,000 mg by mouth daily with breakfast.          . pantoprazole (PROTONIX) 40 MG tablet   Oral   Take 40 mg by mouth at bedtime.          . sodium chloride (OCEAN) 0.65 % nasal spray   Nasal   Place 1 spray into the nose daily.          . vitamin  B-12 (CYANOCOBALAMIN) 1000 MCG tablet   Oral   Take 1,000 mcg by mouth daily with breakfast.          . Vitamin D, Ergocalciferol, (DRISDOL) 50000 UNITS CAPS   Oral   Take 50,000 Units by mouth every 7 (seven) days. On tuesday           BP 164/53  Pulse 65  Temp(Src) 99 F (37.2 C) (Oral)  Resp 20  Ht 5\' 3"  (1.6 m)  Wt 154 lb 6.4 oz (70.035 kg)  BMI 27.36 kg/m2  SpO2 96%  Physical Exam  Nursing note and vitals reviewed. Constitutional: She is oriented to person, place, and time. She appears well-developed and well-nourished.  Non-toxic appearance. No distress.  HENT:  Head: Normocephalic and atraumatic.  Eyes: Conjunctivae, EOM and lids are normal. Pupils are equal, round, and reactive to light.  Neck: Normal range of motion. Neck supple. No tracheal deviation present. No mass present.  Cardiovascular: Normal rate, regular rhythm and normal heart sounds.  Exam reveals no gallop.   No murmur heard. Pulmonary/Chest: Effort normal. No stridor. No  respiratory distress. She has decreased breath sounds. She has no wheezes. She has no rhonchi. She has no rales.  Abdominal: Soft. Normal appearance and bowel sounds are normal. She exhibits no distension. There is no tenderness. There is no rebound and no CVA tenderness.  Musculoskeletal: Normal range of motion. She exhibits no edema and no tenderness.  Neurological: She is alert and oriented to person, place, and time. She has normal strength. No cranial nerve deficit or sensory deficit. GCS eye subscore is 4. GCS verbal subscore is 5. GCS motor subscore is 6.  Skin: Skin is warm and dry. No abrasion and no rash noted.  Psychiatric: She has a normal mood and affect. Her speech is normal and behavior is normal.    ED Course  Procedures (including critical care time)  Labs Reviewed - No data to display No results found.   No diagnosis found.    MDM  cxr neg, pt with questionable bronchitis, will tx with zpak        Leota Jacobsen, MD 06/04/12 2135

## 2012-06-04 NOTE — ED Notes (Signed)
Pt states she started with sore throat on Tuesday and is also coughing up green phlegm, denies chest pain, no shortness of breath

## 2012-06-29 ENCOUNTER — Other Ambulatory Visit (HOSPITAL_BASED_OUTPATIENT_CLINIC_OR_DEPARTMENT_OTHER): Payer: Medicare Other | Admitting: Lab

## 2012-06-29 DIAGNOSIS — I780 Hereditary hemorrhagic telangiectasia: Secondary | ICD-10-CM

## 2012-06-29 DIAGNOSIS — K31819 Angiodysplasia of stomach and duodenum without bleeding: Secondary | ICD-10-CM

## 2012-06-29 DIAGNOSIS — D509 Iron deficiency anemia, unspecified: Secondary | ICD-10-CM

## 2012-06-29 DIAGNOSIS — I10 Essential (primary) hypertension: Secondary | ICD-10-CM

## 2012-06-29 LAB — CBC WITH DIFFERENTIAL/PLATELET
Eosinophils Absolute: 0.4 10*3/uL (ref 0.0–0.5)
HCT: 28.4 % — ABNORMAL LOW (ref 34.8–46.6)
LYMPH%: 22.2 % (ref 14.0–49.7)
MONO#: 0.4 10*3/uL (ref 0.1–0.9)
NEUT#: 2 10*3/uL (ref 1.5–6.5)
NEUT%: 54.5 % (ref 38.4–76.8)
Platelets: 164 10*3/uL (ref 145–400)
WBC: 3.7 10*3/uL — ABNORMAL LOW (ref 3.9–10.3)
lymph#: 0.8 10*3/uL — ABNORMAL LOW (ref 0.9–3.3)

## 2012-06-29 LAB — FERRITIN: Ferritin: 84 ng/mL (ref 10–291)

## 2012-07-27 ENCOUNTER — Encounter: Payer: Self-pay | Admitting: *Deleted

## 2012-07-27 ENCOUNTER — Telehealth: Payer: Self-pay | Admitting: *Deleted

## 2012-07-27 ENCOUNTER — Other Ambulatory Visit (HOSPITAL_BASED_OUTPATIENT_CLINIC_OR_DEPARTMENT_OTHER): Payer: Medicare Other

## 2012-07-27 ENCOUNTER — Telehealth: Payer: Self-pay | Admitting: Oncology

## 2012-07-27 ENCOUNTER — Other Ambulatory Visit: Payer: Self-pay | Admitting: Oncology

## 2012-07-27 ENCOUNTER — Other Ambulatory Visit: Payer: Self-pay | Admitting: *Deleted

## 2012-07-27 DIAGNOSIS — D649 Anemia, unspecified: Secondary | ICD-10-CM

## 2012-07-27 DIAGNOSIS — I10 Essential (primary) hypertension: Secondary | ICD-10-CM

## 2012-07-27 DIAGNOSIS — I780 Hereditary hemorrhagic telangiectasia: Secondary | ICD-10-CM

## 2012-07-27 DIAGNOSIS — D509 Iron deficiency anemia, unspecified: Secondary | ICD-10-CM

## 2012-07-27 DIAGNOSIS — K31819 Angiodysplasia of stomach and duodenum without bleeding: Secondary | ICD-10-CM

## 2012-07-27 LAB — CBC WITH DIFFERENTIAL/PLATELET
Basophils Absolute: 0 10*3/uL (ref 0.0–0.1)
EOS%: 7.6 % — ABNORMAL HIGH (ref 0.0–7.0)
Eosinophils Absolute: 0.3 10*3/uL (ref 0.0–0.5)
HCT: 23.6 % — ABNORMAL LOW (ref 34.8–46.6)
HGB: 7.8 g/dL — ABNORMAL LOW (ref 11.6–15.9)
MCH: 25.6 pg (ref 25.1–34.0)
MCV: 77.7 fL — ABNORMAL LOW (ref 79.5–101.0)
NEUT#: 2.6 10*3/uL (ref 1.5–6.5)
NEUT%: 61.9 % (ref 38.4–76.8)
lymph#: 0.7 10*3/uL — ABNORMAL LOW (ref 0.9–3.3)

## 2012-07-27 LAB — IRON AND TIBC
%SAT: 5 % — ABNORMAL LOW (ref 20–55)
Iron: 15 ug/dL — ABNORMAL LOW (ref 42–145)
UIBC: 307 ug/dL (ref 125–400)

## 2012-07-27 NOTE — Progress Notes (Signed)
Patient in for lab only today, CBC completed with Hgb=7.8. Reviewed by Dr [name], we will also run Iron/TIBC/Ferritin and have patient scheduled for potential IV iron infusion this Friday. Patient will get a call from scheduling with appts.

## 2012-07-27 NOTE — Telephone Encounter (Signed)
Per staff phone call and POF I have schedueld appts.  JMW  

## 2012-07-27 NOTE — Telephone Encounter (Signed)
s.w. pt and advised on 6.20.14 appt....pt ok and aware

## 2012-07-29 ENCOUNTER — Ambulatory Visit (HOSPITAL_BASED_OUTPATIENT_CLINIC_OR_DEPARTMENT_OTHER): Payer: Medicare Other

## 2012-07-29 VITALS — BP 99/45 | HR 65 | Temp 98.7°F | Resp 16

## 2012-07-29 DIAGNOSIS — K31819 Angiodysplasia of stomach and duodenum without bleeding: Secondary | ICD-10-CM

## 2012-07-29 DIAGNOSIS — D509 Iron deficiency anemia, unspecified: Secondary | ICD-10-CM

## 2012-07-29 DIAGNOSIS — D649 Anemia, unspecified: Secondary | ICD-10-CM

## 2012-07-29 DIAGNOSIS — K922 Gastrointestinal hemorrhage, unspecified: Secondary | ICD-10-CM

## 2012-07-29 DIAGNOSIS — I780 Hereditary hemorrhagic telangiectasia: Secondary | ICD-10-CM

## 2012-07-29 MED ORDER — SODIUM CHLORIDE 0.9 % IJ SOLN
10.0000 mL | INTRAMUSCULAR | Status: DC | PRN
  Filled 2012-07-29: qty 10

## 2012-07-29 MED ORDER — SODIUM CHLORIDE 0.9 % IV SOLN
INTRAVENOUS | Status: DC
  Administered 2012-07-29: 11:00:00 via INTRAVENOUS

## 2012-07-29 MED ORDER — FERUMOXYTOL INJECTION 510 MG/17 ML
1020.0000 mg | Freq: Once | INTRAVENOUS | Status: AC
  Administered 2012-07-29: 1020 mg via INTRAVENOUS
  Filled 2012-07-29: qty 34

## 2012-07-29 NOTE — Progress Notes (Signed)
1145 Pt observed post Fereheme infusion for 30 minutes. V/S WNL. Pt denies any signs symptoms of reaction. Pt discharged alert and ambulatory.

## 2012-07-29 NOTE — Patient Instructions (Addendum)
Ferumoxytol injection What is this medicine? FERUMOXYTOL is an iron complex. Iron is used to make healthy red blood cells, which carry oxygen and nutrients throughout the body. This medicine is used to treat iron deficiency anemia in people with chronic kidney disease. This medicine may be used for other purposes; ask your health care provider or pharmacist if you have questions. What should I tell my health care provider before I take this medicine? They need to know if you have any of these conditions: -anemia not caused by low iron levels -high levels of iron in the blood -magnetic resonance imaging (MRI) test scheduled -an unusual or allergic reaction to iron, other medicines, foods, dyes, or preservatives -pregnant or trying to get pregnant -breast-feeding How should I use this medicine? This medicine is for infusion into a vein. It is given by a health care professional in a hospital or clinic setting. Talk to your pediatrician regarding the use of this medicine in children. Special care may be needed. Overdosage: If you think you've taken too much of this medicine contact a poison control center or emergency room at once. Overdosage: If you think you have taken too much of this medicine contact a poison control center or emergency room at once. NOTE: This medicine is only for you. Do not share this medicine with others. What if I miss a dose? It is important not to miss your dose. Call your doctor or health care professional if you are unable to keep an appointment. What may interact with this medicine? This medicine may interact with the following medications: -other iron products This list may not describe all possible interactions. Give your health care provider a list of all the medicines, herbs, non-prescription drugs, or dietary supplements you use. Also tell them if you smoke, drink alcohol, or use illegal drugs. Some items may interact with your medicine. What should I watch  for while using this medicine? Visit your doctor or healthcare professional regularly. Tell your doctor or healthcare professional if your symptoms do not start to get better or if they get worse. You may need blood work done while you are taking this medicine. You may need to follow a special diet. Talk to your doctor. Foods that contain iron include: whole grains/cereals, dried fruits, beans, or peas, leafy green vegetables, and organ meats (liver, kidney). What side effects may I notice from receiving this medicine? Side effects that you should report to your doctor or health care professional as soon as possible: -allergic reactions like skin rash, itching or hives, swelling of the face, lips, or tongue -breathing problems -changes in blood pressure -feeling faint or lightheaded, falls -fever or chills -flushing, sweating, or hot feelings -swelling of the ankles or feet Side effects that usually do not require medical attention (Report these to your doctor or health care professional if they continue or are bothersome.): -diarrhea -headache -nausea, vomiting -stomach pain This list may not describe all possible side effects. Call your doctor for medical advice about side effects. You may report side effects to FDA at 1-800-FDA-1088. Where should I keep my medicine? This drug is given in a hospital or clinic and will not be stored at home. NOTE: This sheet is a summary. It may not cover all possible information. If you have questions about this medicine, talk to your doctor, pharmacist, or health care provider.  2013, [employer]. (10/19/2007 9:48:25 PM)  

## 2012-08-24 ENCOUNTER — Other Ambulatory Visit (HOSPITAL_BASED_OUTPATIENT_CLINIC_OR_DEPARTMENT_OTHER): Payer: Medicare Other | Admitting: Lab

## 2012-08-24 ENCOUNTER — Other Ambulatory Visit: Payer: Self-pay | Admitting: Oncology

## 2012-08-24 DIAGNOSIS — I780 Hereditary hemorrhagic telangiectasia: Secondary | ICD-10-CM

## 2012-08-24 DIAGNOSIS — Q2733 Arteriovenous malformation of digestive system vessel: Secondary | ICD-10-CM

## 2012-08-24 DIAGNOSIS — I10 Essential (primary) hypertension: Secondary | ICD-10-CM

## 2012-08-24 DIAGNOSIS — D509 Iron deficiency anemia, unspecified: Secondary | ICD-10-CM

## 2012-08-24 DIAGNOSIS — K31819 Angiodysplasia of stomach and duodenum without bleeding: Secondary | ICD-10-CM

## 2012-08-24 LAB — CBC WITH DIFFERENTIAL/PLATELET
BASO%: 0.4 % (ref 0.0–2.0)
Eosinophils Absolute: 0.1 10*3/uL (ref 0.0–0.5)
LYMPH%: 12.2 % — ABNORMAL LOW (ref 14.0–49.7)
MCHC: 30.4 g/dL — ABNORMAL LOW (ref 31.5–36.0)
MONO#: 0.4 10*3/uL (ref 0.1–0.9)
NEUT#: 3.6 10*3/uL (ref 1.5–6.5)
Platelets: 188 10*3/uL (ref 145–400)
RBC: 2.99 10*6/uL — ABNORMAL LOW (ref 3.70–5.45)
RDW: 19.5 % — ABNORMAL HIGH (ref 11.2–14.5)
WBC: 4.7 10*3/uL (ref 3.9–10.3)
lymph#: 0.6 10*3/uL — ABNORMAL LOW (ref 0.9–3.3)

## 2012-09-15 ENCOUNTER — Emergency Department (INDEPENDENT_AMBULATORY_CARE_PROVIDER_SITE_OTHER): Payer: Medicare Other

## 2012-09-15 ENCOUNTER — Observation Stay (HOSPITAL_COMMUNITY)
Admission: EM | Admit: 2012-09-15 | Discharge: 2012-09-17 | DRG: 812 | Disposition: A | Discharge status: Home / Self Care | Payer: Medicare Other | Attending: Internal Medicine | Admitting: Internal Medicine

## 2012-09-15 ENCOUNTER — Encounter (HOSPITAL_COMMUNITY): Payer: Self-pay | Admitting: *Deleted

## 2012-09-15 ENCOUNTER — Emergency Department (HOSPITAL_COMMUNITY)
Admission: EM | Admit: 2012-09-15 | Discharge: 2012-09-15 | Disposition: A | Discharge status: Nursing Home (Includes ICF) | Payer: Medicare Other | Source: Home / Self Care

## 2012-09-15 DIAGNOSIS — R040 Epistaxis: Secondary | ICD-10-CM

## 2012-09-15 DIAGNOSIS — D649 Anemia, unspecified: Secondary | ICD-10-CM

## 2012-09-15 DIAGNOSIS — I780 Hereditary hemorrhagic telangiectasia: Secondary | ICD-10-CM

## 2012-09-15 DIAGNOSIS — IMO0001 Reserved for inherently not codable concepts without codable children: Secondary | ICD-10-CM | POA: Insufficient documentation

## 2012-09-15 DIAGNOSIS — K922 Gastrointestinal hemorrhage, unspecified: Secondary | ICD-10-CM

## 2012-09-15 DIAGNOSIS — R5381 Other malaise: Secondary | ICD-10-CM | POA: Insufficient documentation

## 2012-09-15 DIAGNOSIS — K31819 Angiodysplasia of stomach and duodenum without bleeding: Secondary | ICD-10-CM

## 2012-09-15 DIAGNOSIS — D509 Iron deficiency anemia, unspecified: Secondary | ICD-10-CM | POA: Diagnosis not present

## 2012-09-15 DIAGNOSIS — R918 Other nonspecific abnormal finding of lung field: Secondary | ICD-10-CM | POA: Diagnosis not present

## 2012-09-15 DIAGNOSIS — Z6827 Body mass index (BMI) 27.0-27.9, adult: Secondary | ICD-10-CM | POA: Insufficient documentation

## 2012-09-15 DIAGNOSIS — E876 Hypokalemia: Secondary | ICD-10-CM

## 2012-09-15 DIAGNOSIS — E669 Obesity, unspecified: Secondary | ICD-10-CM | POA: Diagnosis not present

## 2012-09-15 DIAGNOSIS — Z794 Long term (current) use of insulin: Secondary | ICD-10-CM | POA: Diagnosis not present

## 2012-09-15 DIAGNOSIS — R079 Chest pain, unspecified: Secondary | ICD-10-CM

## 2012-09-15 DIAGNOSIS — I5040 Unspecified combined systolic (congestive) and diastolic (congestive) heart failure: Secondary | ICD-10-CM

## 2012-09-15 DIAGNOSIS — R9389 Abnormal findings on diagnostic imaging of other specified body structures: Secondary | ICD-10-CM

## 2012-09-15 DIAGNOSIS — E1129 Type 2 diabetes mellitus with other diabetic kidney complication: Secondary | ICD-10-CM | POA: Diagnosis present

## 2012-09-15 DIAGNOSIS — E1165 Type 2 diabetes mellitus with hyperglycemia: Secondary | ICD-10-CM

## 2012-09-15 DIAGNOSIS — I517 Cardiomegaly: Secondary | ICD-10-CM

## 2012-09-15 DIAGNOSIS — I10 Essential (primary) hypertension: Secondary | ICD-10-CM | POA: Diagnosis not present

## 2012-09-15 DIAGNOSIS — R531 Weakness: Secondary | ICD-10-CM

## 2012-09-15 DIAGNOSIS — Z79899 Other long term (current) drug therapy: Secondary | ICD-10-CM | POA: Diagnosis not present

## 2012-09-15 DIAGNOSIS — E119 Type 2 diabetes mellitus without complications: Secondary | ICD-10-CM | POA: Diagnosis present

## 2012-09-15 DIAGNOSIS — R9431 Abnormal electrocardiogram [ECG] [EKG]: Secondary | ICD-10-CM

## 2012-09-15 DIAGNOSIS — E785 Hyperlipidemia, unspecified: Secondary | ICD-10-CM

## 2012-09-15 DIAGNOSIS — E118 Type 2 diabetes mellitus with unspecified complications: Secondary | ICD-10-CM

## 2012-09-15 DIAGNOSIS — D62 Acute posthemorrhagic anemia: Secondary | ICD-10-CM

## 2012-09-15 DIAGNOSIS — IMO0002 Reserved for concepts with insufficient information to code with codable children: Secondary | ICD-10-CM

## 2012-09-15 HISTORY — DX: Pneumonia, unspecified organism: J18.9

## 2012-09-15 HISTORY — DX: Personal history of other medical treatment: Z92.89

## 2012-09-15 HISTORY — DX: Type 2 diabetes mellitus without complications: E11.9

## 2012-09-15 HISTORY — DX: Family history of other specified conditions: Z84.89

## 2012-09-15 LAB — CBC
HCT: 19.3 % — ABNORMAL LOW (ref 36.0–46.0)
Hemoglobin: 6.1 g/dL — CL (ref 12.0–15.0)
MCV: 75.1 fL — ABNORMAL LOW (ref 78.0–100.0)
RBC: 2.57 MIL/uL — ABNORMAL LOW (ref 3.87–5.11)
RDW: 18.9 % — ABNORMAL HIGH (ref 11.5–15.5)
WBC: 6.4 10*3/uL (ref 4.0–10.5)

## 2012-09-15 LAB — POCT I-STAT, CHEM 8
BUN: 19 mg/dL (ref 6–23)
Calcium, Ion: 1.19 mmol/L (ref 1.13–1.30)
Chloride: 105 mEq/L (ref 96–112)
Creatinine, Ser: 0.8 mg/dL (ref 0.50–1.10)
Glucose, Bld: 255 mg/dL — ABNORMAL HIGH (ref 70–99)
HCT: 21 % — ABNORMAL LOW (ref 36.0–46.0)
Potassium: 4.4 mEq/L (ref 3.5–5.1)

## 2012-09-15 LAB — TROPONIN I: Troponin I: <0.3 ng/mL (ref <0.30)

## 2012-09-15 LAB — PRO B NATRIURETIC PEPTIDE: Pro B Natriuretic peptide (BNP): 139.3 pg/mL — ABNORMAL HIGH (ref 0–125)

## 2012-09-15 LAB — GLUCOSE, CAPILLARY: Glucose-Capillary: 249 mg/dL — ABNORMAL HIGH (ref 70–99)

## 2012-09-15 MED ORDER — ENALAPRIL MALEATE 20 MG PO TABS
20.0000 mg | ORAL_TABLET | Freq: Every day | ORAL | Status: DC
  Administered 2012-09-16 – 2012-09-17 (×2): 20 mg via ORAL
  Filled 2012-09-15 (×2): qty 1

## 2012-09-15 MED ORDER — INSULIN ASPART 100 UNIT/ML ~~LOC~~ SOLN
0.0000 [IU] | Freq: Every day | SUBCUTANEOUS | Status: DC
  Administered 2012-09-15: 5 [IU] via SUBCUTANEOUS

## 2012-09-15 MED ORDER — VITAMIN B-12 1000 MCG PO TABS
1000.0000 ug | ORAL_TABLET | Freq: Every day | ORAL | Status: DC
  Administered 2012-09-15 – 2012-09-16 (×2): 1000 ug via ORAL
  Filled 2012-09-15 (×3): qty 1

## 2012-09-15 MED ORDER — FUROSEMIDE 10 MG/ML IJ SOLN
INTRAMUSCULAR | Status: AC
  Filled 2012-09-15: qty 4

## 2012-09-15 MED ORDER — SODIUM CHLORIDE 0.9 % IJ SOLN
3.0000 mL | Freq: Two times a day (BID) | INTRAMUSCULAR | Status: DC
  Administered 2012-09-15 – 2012-09-17 (×4): 3 mL via INTRAVENOUS

## 2012-09-15 MED ORDER — INSULIN GLARGINE 100 UNIT/ML ~~LOC~~ SOLN
40.0000 [IU] | Freq: Every day | SUBCUTANEOUS | Status: DC
  Administered 2012-09-15: 40 [IU] via SUBCUTANEOUS
  Filled 2012-09-15 (×2): qty 0.4

## 2012-09-15 MED ORDER — SALINE SPRAY 0.65 % NA SOLN
1.0000 | NASAL | Status: DC | PRN
  Filled 2012-09-15: qty 44

## 2012-09-15 MED ORDER — SODIUM CHLORIDE 0.9 % IJ SOLN
3.0000 mL | Freq: Two times a day (BID) | INTRAMUSCULAR | Status: DC
  Administered 2012-09-16 – 2012-09-17 (×2): 3 mL via INTRAVENOUS

## 2012-09-15 MED ORDER — ACETAMINOPHEN 325 MG PO TABS: 650.0000 mg | ORAL_TABLET | Freq: Four times a day (QID) | ORAL | Status: DC | PRN

## 2012-09-15 MED ORDER — INSULIN ASPART 100 UNIT/ML ~~LOC~~ SOLN
0.0000 [IU] | Freq: Three times a day (TID) | SUBCUTANEOUS | Status: DC
  Administered 2012-09-16: 15 [IU] via SUBCUTANEOUS
  Administered 2012-09-16: 3 [IU] via SUBCUTANEOUS
  Administered 2012-09-17 (×2): 5 [IU] via SUBCUTANEOUS

## 2012-09-15 MED ORDER — SODIUM CHLORIDE 0.9 % IJ SOLN
3.0000 mL | INTRAMUSCULAR | Status: DC | PRN
  Administered 2012-09-16: 3 mL via INTRAVENOUS

## 2012-09-15 MED ORDER — SODIUM CHLORIDE 0.9 % IV SOLN: 250.0000 mL | INTRAVENOUS | Status: DC | PRN

## 2012-09-15 MED ORDER — ACETAMINOPHEN 650 MG RE SUPP: 650.0000 mg | Freq: Four times a day (QID) | RECTAL | Status: DC | PRN

## 2012-09-15 MED ORDER — AMITRIPTYLINE HCL 25 MG PO TABS
25.0000 mg | ORAL_TABLET | Freq: Every evening | ORAL | Status: DC | PRN
  Filled 2012-09-15: qty 1

## 2012-09-15 MED ORDER — FUROSEMIDE 10 MG/ML IJ SOLN: 20.0000 mg | Freq: Once | INTRAMUSCULAR | Status: AC

## 2012-09-15 MED ORDER — PANTOPRAZOLE SODIUM 40 MG PO TBEC
40.0000 mg | DELAYED_RELEASE_TABLET | Freq: Every day | ORAL | Status: DC
  Administered 2012-09-15 – 2012-09-17 (×3): 40 mg via ORAL
  Filled 2012-09-15 (×3): qty 1

## 2012-09-15 MED ORDER — DOCUSATE SODIUM 100 MG PO CAPS
100.0000 mg | ORAL_CAPSULE | Freq: Two times a day (BID) | ORAL | Status: DC
  Administered 2012-09-15 – 2012-09-17 (×4): 100 mg via ORAL
  Filled 2012-09-15 (×5): qty 1

## 2012-09-15 MED ORDER — SODIUM CHLORIDE 0.9 % IJ SOLN
INTRAMUSCULAR | Status: AC
  Filled 2012-09-15: qty 3

## 2012-09-15 NOTE — ED Notes (Signed)
Patient states weakness and dizziness increasing over past week, patient also states burning sensation around heart with exertion

## 2012-09-15 NOTE — ED Provider Notes (Signed)
CSN: [id number]     Arrival date & time 09/15/12  1601 History     First MD Initiated Contact with Patient 09/15/12 [phone]     Chief Complaint  Patient presents with  . Weakness  . Chest Pain    burning sensation around heart with exertion    (Consider location/radiation/quality/duration/timing/severity/associated sxs/prior Treatment) Patient is a 72 y.o. female presenting with weakness. The history is provided by the patient and medical records.  Weakness This is a recurrent problem. Episode onset: over the past couple of weeks. The problem occurs constantly. The problem has been gradually worsening. Associated symptoms include weakness. Pertinent negatives include no abdominal pain, chest pain, chills, congestion, coughing, diaphoresis, fatigue, fever, headaches, nausea, neck pain, numbness, rash, sore throat or vomiting. Exacerbated by: exertion. She has tried nothing for the symptoms. The treatment provided no relief.    Past Medical History  Diagnosis Date  . Anemia   . S/P endoscopy     with laser treatment  08/19/2006  . History of epistaxis   . Diabetes mellitus     insulin requiring.  Marland Kitchen Hypertension   . Hyperlipidemia   . Telangiectasia     Gastric   . Lichen planus     Both lower extremities  . GERD (gastroesophageal reflux disease)   . Heart murmur   . HTN (hypertension), benign 03/02/2012   Past Surgical History  Procedure Laterality Date  . Nose surgery    . Savory dilation  02/26/2011    Procedure: SAVORY DILATION;  Surgeon: [name], MD;  Location: [name];  Service: Endoscopy;  Laterality: N/A;  c-arm needed  . Esophagogastroduodenoscopy  02/26/2011    Procedure: ESOPHAGOGASTRODUODENOSCOPY (EGD);  Surgeon: [name], MD;  Location: [name];  Service: Endoscopy;  Laterality: N/A;   Family History  Problem Relation Age of Onset  . Breast cancer    . Malignant hyperthermia Neg Hx   . Stroke Father    History  Substance Use Topics  . Smoking  status: Former Research scientist (life sciences)  . Smokeless tobacco: Never Used  . Alcohol Use: No   OB History   Grav Para Term Preterm Abortions TAB SAB Ect Mult Living                 Review of Systems  Constitutional: Negative for fever, chills, diaphoresis and fatigue.  HENT: Negative for ear pain, congestion, sore throat, facial swelling, mouth sores, trouble swallowing, neck pain and neck stiffness.   Eyes: Negative.   Respiratory: Negative for apnea, cough, chest tightness, shortness of breath and wheezing.   Cardiovascular: Negative for chest pain, palpitations and leg swelling.  Gastrointestinal: Negative for nausea, vomiting, abdominal pain, diarrhea and abdominal distention.  Genitourinary: Negative for hematuria, flank pain, vaginal discharge, difficulty urinating and menstrual problem.  Musculoskeletal: Negative for back pain and gait problem.  Skin: Positive for pallor. Negative for rash and wound.  Neurological: Positive for weakness. Negative for dizziness, tremors, seizures, syncope, facial asymmetry, numbness and headaches.  Psychiatric/Behavioral: Negative.   All other systems reviewed and are negative.    Allergies  Aspirin  Home Medications   Current Outpatient Rx  Name  Route  Sig  Dispense  Refill  . amitriptyline (ELAVIL) 25 MG tablet   Oral   Take 25 mg by mouth At bedtime as needed for sleep.          . enalapril (VASOTEC) 20 MG tablet   Oral   Take 20 mg by mouth daily.         Marland Kitchen  insulin glargine (LANTUS) 100 UNIT/ML injection   Subcutaneous   Inject 40 Units into the skin at bedtime.         . pantoprazole (PROTONIX) 40 MG tablet   Oral   Take 40 mg by mouth daily as needed. For heartburn         . sodium chloride (OCEAN) 0.65 % nasal spray   Nasal   Place 1 spray into the nose as needed. For nasal congestion         . vitamin B-12 (CYANOCOBALAMIN) 1000 MCG tablet   Oral   Take 1,000 mcg by mouth at bedtime.          . Vitamin D, Ergocalciferol,  (DRISDOL) 50000 UNITS CAPS   Oral   Take 50,000 Units by mouth every 7 (seven) days. Takes on Monday          BP 131/49  Pulse 66  Temp(Src) 98.3 F (36.8 C) (Oral)  Resp 12  Ht 5\' 4"  (1.626 m)  Wt 156 lb (70.761 kg)  BMI 26.76 kg/m2  SpO2 99% Physical Exam  Nursing note and vitals reviewed. Constitutional: She is oriented to person, place, and time. She appears well-developed and well-nourished. No distress.  HENT:  Head: Normocephalic and atraumatic.  Right Ear: External ear normal.  Left Ear: External ear normal.  Nose: Nose normal.  Mouth/Throat: Oropharynx is clear and moist. No oropharyngeal exudate.  Eyes: Conjunctivae and EOM are normal. Pupils are equal, round, and reactive to light. Right eye exhibits no discharge. Left eye exhibits no discharge.  Bilateral conjunctival pallor  Neck: Normal range of motion. Neck supple. No JVD present. No tracheal deviation present. No thyromegaly present.  Cardiovascular: Normal rate, regular rhythm, normal heart sounds and intact distal pulses.  Exam reveals no gallop and no friction rub.   No murmur heard. Pulmonary/Chest: Effort normal and breath sounds normal. No respiratory distress. She has no wheezes. She has no rales. She exhibits no tenderness.  Abdominal: Soft. Bowel sounds are normal. She exhibits no distension. There is no tenderness. There is no rebound and no guarding.  Musculoskeletal: Normal range of motion.  Lymphadenopathy:    She has no cervical adenopathy.  Neurological: She is alert and oriented to person, place, and time. No cranial nerve deficit. Coordination normal.  Skin: Skin is warm. No rash noted. She is not diaphoretic.  Psychiatric: She has a normal mood and affect. Her behavior is normal. Judgment and thought content normal.    ED Course   Procedures (including critical care time)  Labs Reviewed  PRO B NATRIURETIC PEPTIDE  TROPONIN I  OCCULT BLOOD, POC DEVICE  TYPE AND SCREEN  PREPARE RBC  (CROSSMATCH)   Dg Chest 2 View  09/15/2012   *RADIOLOGY REPORT*  Clinical Data: Weakness; history of heart murmur  CHEST - 2 VIEW  Comparison: June 04, 2012  Findings: There is generalized cardiomegaly with mild pulmonary venous hypertension.  There is mild interstitial edema.  No consolidation.  No adenopathy.  There is atherosclerotic change in the aorta.  No bone lesions.  IMPRESSION: There is evidence of a degree of congestive heart failure.  Based on the appearance of the prior chest radiograph, there may be a degree of chronic congestive heart failure.  There is no consolidation.   Original Report Authenticated By: Lowella Grip, M.D.   1. Anemia   2. Cardiomegaly     MDM  72 yr old F pt here with anemia. 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 She says she periodically requires transfusions. She says that over the past couple of weeks she has felt more tired with exertion. She does not endorse orthopnea, CP, SOB, N/V/D. She has not noticed any bleeding and has not noticed darkened stool. Her Hemoccult here is negative. The concern is that she is having a GI bleed. [facility] Hb [facility] 6.1 and it typicalyl runs between 8-9. She was also found to have evidence of CHF on CXR and does not have a hx of that. Will order transfusion here for symptomatic anemia and will admit to hospital. Will also get some labs to further investigate the dyspnea on exertion.   Date: 09/15/2012  Rate: 71  Rhythm: normal sinus rhythm  QRS Axis: normal  Intervals: normal  ST/T Wave abnormalities: flat t wasves laterally  Conduction Disutrbances:none  Narrative Interpretation:   Old EKG Reviewed: changes noted  Case discussed with Dr Susann Givens, MD 09/15/12 1710

## 2012-09-15 NOTE — ED Notes (Signed)
Attempted  To  Start  A  Saline  Lok      X  1  No  susscess

## 2012-09-15 NOTE — H&P (Signed)
[facility] History and Physical  [name] [id number] DOB: 23-Oct-1940 DOA: 09/15/2012  Referring physician: EDP PCP: Philis Fendt, MD   Chief Complaint: weakness  HPI: [name] is a 72 y.o. 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  She also noted pale conjuctiva. Ankle swelling.  No Orthopnea, PND.  No recent melena, hematochesia, significant epistaxis.  She is followed by Dr. [name] and recieves periodic IV iron infusions.  Hgb in urgent care 6.1. Repeat in ED 7.1. 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  Urgent care note reports chest pain, but patient denies.  CXR shows CHF, but proBNP only 139. Also notes blood glucose running higher than usual.  Last echo in 2006 showed normal EF  Review of Systems: systems reviewed. As above, otherwise negative.  Past Medical History  Diagnosis Date  . Anemia   . S/P endoscopy     with laser treatment  08/19/2006  . History of epistaxis   . Diabetes mellitus     insulin requiring.  Marland Kitchen Hypertension   . Hyperlipidemia   . Telangiectasia     Gastric   . Lichen planus     Both lower extremities  . GERD (gastroesophageal reflux disease)   . Heart murmur   . HTN (hypertension), benign 03/02/2012   Past Surgical History  Procedure Laterality Date  . Nose surgery    . Savory dilation  02/26/2011    Procedure: SAVORY DILATION;  Surgeon: [name], MD;  Location: [name];  Service: Endoscopy;  Laterality: N/A;  c-arm needed  . Esophagogastroduodenoscopy  02/26/2011    Procedure: ESOPHAGOGASTRODUODENOSCOPY (EGD);  Surgeon: [name], MD;  Location: [name];  Service: Endoscopy;  Laterality: N/A;   Social History:  reports that she has quit smoking. She has never used smokeless tobacco. She reports that she does not drink alcohol or use illicit drugs. Married  Allergies  Allergen  Reactions  . Aspirin Nausea And Vomiting    Family History  Problem Relation Age of Onset  . Breast cancer    . Malignant hyperthermia Neg Hx   . Stroke Father   siblings require frequent transfusions and IV iron  Prior to Admission medications   Medication Sig Start Date End Date Taking? Authorizing Provider  amitriptyline (ELAVIL) 25 MG tablet Take 25 mg by mouth At bedtime as needed for sleep.  01/05/12  Yes Historical Provider, MD  enalapril (VASOTEC) 20 MG tablet Take 20 mg by mouth daily.   Yes Historical Provider, MD  insulin glargine (LANTUS) 100 UNIT/ML injection Inject 40 Units into the skin at bedtime. 10/30/11  Yes Verlee Monte, MD  pantoprazole (PROTONIX) 40 MG tablet Take 40 mg by mouth daily as needed. For heartburn   Yes Historical Provider, MD  sodium chloride (OCEAN) 0.65 % nasal spray Place 1 spray into the nose as needed. For nasal congestion   Yes Historical Provider, MD  vitamin B-12 (CYANOCOBALAMIN) 1000 MCG tablet Take 1,000 mcg by mouth at bedtime.    Yes Historical Provider, MD  Vitamin D, Ergocalciferol, (DRISDOL) 50000 UNITS CAPS Take 50,000 Units by mouth every 7 (seven) days. Takes on Monday   Yes Historical Provider, MD   Physical Exam: Filed Vitals:   09/15/12 1700  BP: 133/52  Pulse: 67  Temp:   Resp: 15   BP 154/47  Pulse 67  Temp(Src) 98.5 F (36.9 C) (  Oral)  Resp 20  Ht 5\' 3"  (1.6 m)  Wt 72.1 kg (158 lb 15.2 oz)  BMI 28.16 kg/m2  SpO2 100%  General Appearance:    Alert, cooperative, no distress, appears stated age  Head:    Normocephalic, without obvious abnormality, atraumatic  Eyes:    PERRL, pale conjunctiva, EOM's intact, fundi    benign, both eyes     Nose:   Nares normal, septum midline, mucosa normal, no drainage    or sinus tenderness  Throat:   Lips, mucosa, and tongue normal; teeth and gums normal  Neck:   Supple, symmetrical, trachea midline, no adenopathy;    thyroid:  no enlargement/tenderness/nodules; no carotid   bruit  or JVD  Back:     Symmetric, no curvature, ROM normal, no CVA tenderness  Lungs:     Mild rales at bases. No wheezes or rhonchi  Chest Wall:    No tenderness or deformity   Heart:    Regular rate and rhythm, 3/6 systolic murmur     Abdomen:     Soft, non-tender, bowel sounds active all four quadrants,    no masses, no organomegaly  Genitalia:    deferred  Rectal:    Heme neg per EDP  Extremities:   Extremities normal, atraumatic, trace edema  Pulses:   2+ and symmetric all extremities  Skin:   Multiple nummular coalescing hypopigmented areas with scarring on legs  Lymph nodes:   Cervical, supraclavicular, and axillary nodes normal  Neurologic:   CNII-XII intact, normal strength, sensation and reflexes    throughout    Psychiatric:  Normal affect  Labs on Admission:  Basic Metabolic Panel:  Recent Labs Lab 09/15/12 1451  NA 140  K 4.4  CL 105  GLUCOSE 255*  BUN 19  CREATININE 0.80   Liver Function Tests: No results found for this basename: AST, ALT, ALKPHOS, BILITOT, PROT, ALBUMIN,  in the last 168 hours No results found for this basename: LIPASE, AMYLASE,  in the last 168 hours No results found for this basename: AMMONIA,  in the last 168 hours CBC:  Recent Labs Lab 09/15/12 1404 09/15/12 1451  WBC 6.4  --   HGB 6.1* 7.1*  HCT 19.3* 21.0*  MCV 75.1*  --   PLT 196  --    Cardiac Enzymes:  Recent Labs Lab 09/15/12 1706  TROPONINI <0.30    BNP (last 3 results)  Recent Labs  09/15/12 1706  PROBNP 139.3*   CBG: No results found for this basename: GLUCAP,  in the last 168 hours  Radiological Exams on Admission: Dg Chest 2 View  09/15/2012   *RADIOLOGY REPORT*  Clinical Data: Weakness; history of heart murmur  CHEST - 2 VIEW  Comparison: June 04, 2012  Findings: There is generalized cardiomegaly with mild pulmonary venous hypertension.  There is mild interstitial edema.  No consolidation.  No adenopathy.  There is atherosclerotic change in the aorta.  No  bone lesions.  IMPRESSION: There is evidence of a degree of congestive heart failure.  Based on the appearance of the prior chest radiograph, there may be a degree of chronic congestive heart failure.  There is no consolidation.   Original Report Authenticated By: Lowella Grip, M.D.    EKG: NSR  Assessment/Plan Principal Problem:   ANEMIA, IRON DEFICIENCY NOS, acute on chronic.  2 units pRBCs ordered. Recheck hgb in am.    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 Check  hgb a1c. Continue home insulin dosing and adjust as needed. SSI for now    Obesity  Abnormal CXR: could be mild CHF, as pt c/o leg swelling and has rales at lung bases.  However, proBNP not very high, and dyspnea not a major complaint.  Will check echo and give lasix between units of PRBC. Monitor on tele for now.  Also check TSH  Code Status: full Family Communication: husband Disposition Plan: home  Time spent: 60 minutes  [facility] Pager [phone]  If 7PM-7AM, please contact night-coverage [email or web address] Password [facility] 09/15/2012, 5:46 PM

## 2012-09-15 NOTE — Progress Notes (Signed)
Pt arrived to unit by stretcher, alert and oriented times 4, NAD noted, Oxygen removed, patient O2 sats 95% on RA, no complaints of pain voiced, will continue to monitor

## 2012-09-15 NOTE — ED Provider Notes (Signed)
CSN: [id number]     Arrival date & time 09/15/12  1224 History     First MD Initiated Contact with Patient 09/15/12 1402     Chief Complaint  Patient presents with  . Weakness   (Consider location/radiation/quality/duration/timing/severity/associated sxs/prior Treatment) HPI  72 yo bf presents with weakness, fatigue, chest pan dyspnea.  States that symptoms worsening x 2 weeks.  Known hx of anemia and states that she has had transfusions for this in the past.  Most recent Hgb was 7.6 last month.  Due to see hematology 13 aug but states that she just didn't feel right and was getting worse.  Denies cough, fever, chills, abd pan.    Past Medical History  Diagnosis Date  . Anemia   . S/P endoscopy     with laser treatment  08/19/2006  . History of epistaxis   . Diabetes mellitus     insulin requiring.  Marland Kitchen Hypertension   . Hyperlipidemia   . Telangiectasia     Gastric   . Lichen planus     Both lower extremities  . GERD (gastroesophageal reflux disease)   . Heart murmur   . HTN (hypertension), benign 03/02/2012   Past Surgical History  Procedure Laterality Date  . Nose surgery    . Savory dilation  02/26/2011    Procedure: SAVORY DILATION;  Surgeon: [name], MD;  Location: [name];  Service: Endoscopy;  Laterality: N/A;  c-arm needed  . Esophagogastroduodenoscopy  02/26/2011    Procedure: ESOPHAGOGASTRODUODENOSCOPY (EGD);  Surgeon: [name], MD;  Location: [name];  Service: Endoscopy;  Laterality: N/A;   Family History  Problem Relation Age of Onset  . Breast cancer    . Malignant hyperthermia Neg Hx   . Stroke Father    History  Substance Use Topics  . Smoking status: Former Research scientist (life sciences)  . Smokeless tobacco: Never Used  . Alcohol Use: No   OB History   Grav Para Term Preterm Abortions TAB SAB Ect Mult Living                 Review of Systems  Constitutional: Positive for activity change and fatigue. Negative for fever, chills and diaphoresis.  HENT:  Negative.   Eyes: Negative.   Respiratory: Positive for chest tightness and shortness of breath. Negative for wheezing.   Cardiovascular: Positive for chest pain and leg swelling.  Genitourinary: Negative.   Musculoskeletal: Negative.   Skin: Negative.   Neurological: Negative.   Psychiatric/Behavioral: Negative.     Allergies  Aspirin  Home Medications   Current Outpatient Rx  Name  Route  Sig  Dispense  Refill  . amitriptyline (ELAVIL) 25 MG tablet   Oral   Take 25 mg by mouth At bedtime as needed for sleep.          Marland Kitchen azithromycin (ZITHROMAX Z-PAK) 250 MG tablet      2 by mouth daily x1 then 1 by mouth daily x4 days   6 each   0   . enalapril (VASOTEC) 20 MG tablet   Oral   Take 20 mg by mouth daily.         . insulin glargine (LANTUS) 100 UNIT/ML injection   Subcutaneous   Inject 40 Units into the skin at bedtime.         . metFORMIN (GLUCOPHAGE) 1000 MG tablet   Oral   Take 1,000 mg by mouth daily with breakfast.          .  pantoprazole (PROTONIX) 40 MG tablet   Oral   Take 40 mg by mouth at bedtime.          . sodium chloride (OCEAN) 0.65 % nasal spray   Nasal   Place 1 spray into the nose daily.          . vitamin B-12 (CYANOCOBALAMIN) 1000 MCG tablet   Oral   Take 1,000 mcg by mouth daily with breakfast.          . Vitamin D, Ergocalciferol, (DRISDOL) 50000 UNITS CAPS   Oral   Take 50,000 Units by mouth every 7 (seven) days. On tuesday          BP 142/52  Pulse 64  Temp(Src) 98.5 F (36.9 C) (Oral)  Resp 16  SpO2 99% Physical Exam  Constitutional: She is oriented to person, place, and time. She appears well-developed and well-nourished. No distress.  HENT:  Head: Normocephalic and atraumatic.  Eyes: EOM are normal. Pupils are equal, round, and reactive to light.  Neck: Normal range of motion.  Cardiovascular: Normal rate.   Pulmonary/Chest: Effort normal. No respiratory distress. She has no wheezes.  Musculoskeletal:  Normal range of motion.  Neurological: She is alert and oriented to person, place, and time.  Skin: Skin is warm and dry.  Psychiatric: She has a normal mood and affect.    ED Course   Procedures (including critical care time)  Labs Reviewed  POCT I-STAT, CHEM 8 - Abnormal; Notable for the following:    Glucose, Bld 255 (*)    Hemoglobin 7.1 (*)    HCT 21.0 (*)    All other components within normal limits  CBC   Dg Chest 2 View  09/15/2012   *RADIOLOGY REPORT*  Clinical Data: Weakness; history of heart murmur  CHEST - 2 VIEW  Comparison: June 04, 2012  Findings: There is generalized cardiomegaly with mild pulmonary venous hypertension.  There is mild interstitial edema.  No consolidation.  No adenopathy.  There is atherosclerotic change in the aorta.  No bone lesions.  IMPRESSION: There is evidence of a degree of congestive heart failure.  Based on the appearance of the prior chest radiograph, there may be a degree of chronic congestive heart failure.  There is no consolidation.   Original Report Authenticated By: Lowella Grip, M.D.   1. Anemia   2. CHF (congestive heart failure), unspecified failure chronicity, combined   3. Abnormal EKG   4. Chest pain     MDM  With current symptoms, ekg and lab findings we decided to transfer to ED.  Advised patient and husband present that she will need admission to the hospital.  They both voice understanding.    Benjiman Core, PA-C 09/15/12 1524

## 2012-09-15 NOTE — ED Notes (Signed)
Jahari  Ambulated  To  Room  With a  Slow  Steady  Gait   She  Is  Sitting  Upright  On  Exam table  She  Is  Speaking in  Complete  sentances     She  Reports  Feeling  Wealk  Dizzy  And  Sleepy    For  2  Weeks     The  Pt   Also  denys  Any  Pain or  Any  Shortness of  Breath  Her  Skin is  Warm and  Dry        She     Reports  She  Is  Taking  Her meds  As  Prescribed

## 2012-09-15 NOTE — ED Provider Notes (Signed)
Medical screening examination/treatment/procedure(s) were performed by non-physician practitioner and as supervising physician I was immediately available for consultation/collaboration.  Philipp Deputy, M.D.  Harden Mo, MD 09/15/12 [phone]

## 2012-09-16 DIAGNOSIS — D649 Anemia, unspecified: Secondary | ICD-10-CM

## 2012-09-16 DIAGNOSIS — E118 Type 2 diabetes mellitus with unspecified complications: Secondary | ICD-10-CM

## 2012-09-16 DIAGNOSIS — R918 Other nonspecific abnormal finding of lung field: Secondary | ICD-10-CM

## 2012-09-16 DIAGNOSIS — D509 Iron deficiency anemia, unspecified: Secondary | ICD-10-CM | POA: Diagnosis not present

## 2012-09-16 DIAGNOSIS — Q2733 Arteriovenous malformation of digestive system vessel: Secondary | ICD-10-CM

## 2012-09-16 DIAGNOSIS — I059 Rheumatic mitral valve disease, unspecified: Secondary | ICD-10-CM

## 2012-09-16 LAB — GLUCOSE, CAPILLARY
Glucose-Capillary: 365 mg/dL — ABNORMAL HIGH (ref 70–99)
Glucose-Capillary: 172 mg/dL — ABNORMAL HIGH (ref 70–99)

## 2012-09-16 LAB — HEMOGLOBIN A1C
Hgb A1c MFr Bld: 6.5 % — ABNORMAL HIGH (ref <5.7)
Mean Plasma Glucose: 140 mg/dL — ABNORMAL HIGH (ref <117)

## 2012-09-16 LAB — HEMOGLOBIN AND HEMATOCRIT, BLOOD
HCT: 25.6 % — ABNORMAL LOW (ref 36.0–46.0)
Hemoglobin: 8.4 g/dL — ABNORMAL LOW (ref 12.0–15.0)

## 2012-09-16 LAB — TSH: TSH: 1.032 u[IU]/mL (ref 0.350–4.500)

## 2012-09-16 MED ORDER — INSULIN GLARGINE 100 UNIT/ML ~~LOC~~ SOLN
37.0000 [IU] | Freq: Every day | SUBCUTANEOUS | Status: DC
  Administered 2012-09-16: 37 [IU] via SUBCUTANEOUS
  Filled 2012-09-16 (×2): qty 0.37

## 2012-09-16 NOTE — Progress Notes (Signed)
Hypoglycemic Event  CBG: 53  Treatment: 15 GM carbohydrate snack  Symptoms: None  Follow-up CBG: Time:0724 CBG Result:125  Possible Reasons for Event: Unknown  Comments/MD notified:Progress note / sticky note will be left to notify physician.    [name], [name] R  Remember to initiate Hypoglycemia Order Set & complete

## 2012-09-16 NOTE — Progress Notes (Signed)
Inpatient Diabetes Program Recommendations  AACE/ADA: New Consensus Statement on Inpatient Glycemic Control (2013)  Target Ranges:  Prepandial:   less than 140 mg/dL      Peak postprandial:   less than 180 mg/dL (1-2 hours)      Critically ill patients:  140 - 180 mg/dL     Results for [name], [name] (MRN [id number]) as of 09/16/2012 10:08  Ref. Range 09/16/2012 06:13 09/16/2012 07:25  Glucose-Capillary Latest Range: 70-99 mg/dL 53 (L) 125 (H)    **Hypoglycemic this morning (CBG 53 mg/dl) after receiving 40 units Lantus the night before  **MD- Please decrease Lantus to 37 units QHS (if patient continues to have hypoglycemia in the morning)   Will follow. Wyn Quaker RN, MSN, CDE Diabetes Coordinator Inpatient Diabetes Program [phone]

## 2012-09-16 NOTE — Progress Notes (Signed)
Pt.is A/Ox4 and is ambulatory with 1 person standby assist. She received 2 units of RBC's during the shift. No reaction was noted. Patient's CBG last night was 368 and she received scheduled insulins and a snack. This am patient CBG was 53 snack and juice was given and blood glucose rechecked and was 125.

## 2012-09-16 NOTE — Progress Notes (Signed)
[facility] PROGRESS NOTE  [name] [id number] DOB: Aug 20, 1940 DOA: 09/15/2012 PCP: Philis Fendt, MD  Assessment/Plan: ANEMIA, IRON DEFICIENCY NOS, acute on chronic. 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 Check hgb a1c. Continue home insulin dosing and adjust as needed. SSI for now   Obesity   Abnormal CXR: could be mild CHF, as pt c/o leg swelling and has rales at lung bases. However, proBNP not very high, and dyspnea not a major complaint. Will check echo and give lasix between units of PRBC. Monitor on tele for now.  check TSH   Code Status: full Family Communication: patient at bedside Disposition Plan: home in AM?   Consultants:    Procedures:    Antibiotics:    HPI/Subjective: Up eating breakfast, no new c/o  Objective: Filed Vitals:   09/16/12 0628  BP: 123/61  Pulse: 61  Temp:   Resp: 20    Intake/Output Summary (Last 24 hours) at 09/16/12 0852 Last data filed at 09/16/12 [id number]  Gross per 24 hour  Intake    714 ml  Output   1850 ml  Net  -1136 ml   Filed Weights   09/15/12 1808 09/16/12 0500 09/16/12 0634  Weight: 72.1 kg (158 lb 15.2 oz) 70.126 kg (154 lb 9.6 oz) 70.126 kg (154 lb 9.6 oz)    Exam:   General:  A+Ox3, NAD  Cardiovascular: rrr  Respiratory: clear anterior, no wheezing  Abdomen: +Bs, soft, NT  Musculoskeletal: moves all 4 ext   Data Reviewed: Basic Metabolic Panel:  Recent Labs Lab 09/15/12 1451  NA 140  K 4.4  CL 105  GLUCOSE 255*  BUN 19  CREATININE 0.80   Liver Function Tests: No results found for this basename: AST, ALT, ALKPHOS, BILITOT, PROT, ALBUMIN,  in the last 168 hours No results found for this basename: LIPASE, AMYLASE,  in the last 168 hours No results found for this basename: AMMONIA,  in the last 168 hours CBC:  Recent Labs Lab 09/15/12 1404 09/15/12 1451  WBC 6.4  --   HGB 6.1* 7.1*   HCT 19.3* 21.0*  MCV 75.1*  --   PLT 196  --    Cardiac Enzymes:  Recent Labs Lab 09/15/12 1706  TROPONINI <0.30   BNP (last 3 results)  Recent Labs  09/15/12 1706  PROBNP 139.3*   CBG:  Recent Labs Lab 09/15/12 1818 09/15/12 2138 09/16/12 0613 09/16/12 0725  GLUCAP 249* 368* 53* 125*    No results found for this or any previous visit (from the past 240 hour(s)).   Studies: Dg Chest 2 View  09/15/2012   *RADIOLOGY REPORT*  Clinical Data: Weakness; history of heart murmur  CHEST - 2 VIEW  Comparison: June 04, 2012  Findings: There is generalized cardiomegaly with mild pulmonary venous hypertension.  There is mild interstitial edema.  No consolidation.  No adenopathy.  There is atherosclerotic change in the aorta.  No bone lesions.  IMPRESSION: There is evidence of a degree of congestive heart failure.  Based on the appearance of the prior chest radiograph, there may be a degree of chronic congestive heart failure.  There is no consolidation.   Original Report Authenticated By: Lowella Grip, M.D.    Scheduled Meds: . docusate sodium  100 mg Oral BID  . enalapril  20 mg Oral Daily  . insulin aspart  0-15 Units Subcutaneous TID WC  . insulin glargine  40 Units Subcutaneous QHS  . pantoprazole  40 mg Oral Daily  . sodium chloride  3 mL Intravenous Q12H  . sodium chloride  3 mL Intravenous Q12H  . vitamin B-12  1,000 mcg Oral QHS   Continuous Infusions:   Principal Problem:   ANEMIA, IRON DEFICIENCY NOS Active Problems:   HYPERTENSION, BENIGN SYSTEMIC   Osler-Weber-Rendu disease   DM (diabetes mellitus), type 2, uncontrolled   Obesity   Abnormal CXR    Time spent: [place], [place] [facility] Pager [phone] If 7PM-7AM, please contact night-coverage at [email or web address], password [facility] 09/16/2012, 8:52 AM  LOS: 1 day

## 2012-09-16 NOTE — Progress Notes (Signed)
  Echocardiogram 2D Echocardiogram has been performed.  Mauricio Po 09/16/2012, 12:11 PM

## 2012-09-16 NOTE — Progress Notes (Signed)
Utilization Review Completed.   Nohea Kras, RN, BSN Nurse Case Manager  [phone]  

## 2012-09-17 DIAGNOSIS — E876 Hypokalemia: Secondary | ICD-10-CM

## 2012-09-17 DIAGNOSIS — I10 Essential (primary) hypertension: Secondary | ICD-10-CM

## 2012-09-17 DIAGNOSIS — D509 Iron deficiency anemia, unspecified: Secondary | ICD-10-CM | POA: Diagnosis not present

## 2012-09-17 LAB — BASIC METABOLIC PANEL
GFR calc Af Amer: >90 mL/min (ref >90)
GFR calc non Af Amer: 82 mL/min — ABNORMAL LOW (ref >90)
Glucose, Bld: 156 mg/dL — ABNORMAL HIGH (ref 70–99)
Potassium: 3.3 mEq/L — ABNORMAL LOW (ref 3.5–5.1)
Sodium: 143 mEq/L (ref 135–145)

## 2012-09-17 LAB — CBC
Platelets: 161 10*3/uL (ref 150–400)
RBC: 3.08 MIL/uL — ABNORMAL LOW (ref 3.87–5.11)
RDW: 17.8 % — ABNORMAL HIGH (ref 11.5–15.5)
WBC: 5.9 10*3/uL (ref 4.0–10.5)

## 2012-09-17 LAB — TYPE AND SCREEN
ABO/RH(D): A POS
Unit division: 0

## 2012-09-17 LAB — GLUCOSE, CAPILLARY
Glucose-Capillary: 108 mg/dL — ABNORMAL HIGH (ref 70–99)
Glucose-Capillary: 221 mg/dL — ABNORMAL HIGH (ref 70–99)

## 2012-09-17 MED ORDER — POTASSIUM CHLORIDE CRYS ER 20 MEQ PO TBCR
40.0000 meq | EXTENDED_RELEASE_TABLET | Freq: Once | ORAL | Status: AC
  Administered 2012-09-17: 40 meq via ORAL
  Filled 2012-09-17: qty 2

## 2012-09-17 NOTE — Progress Notes (Addendum)
75 Dr Collier Bullock of today's lab .Seen the pt. With  orders

## 2012-09-17 NOTE — Discharge Summary (Signed)
Physician Discharge Summary  [name] [id number] DOB: 1940-02-28 DOA: 09/15/2012  PCP: Philis Fendt, MD  Admit date: 09/15/2012 Discharge date: 09/17/2012  Time spent: 35 minutes  Recommendations for Outpatient Follow-up:  CBC 1 week  Discharge Diagnoses:  Principal Problem:   ANEMIA, IRON DEFICIENCY NOS Active Problems:   HYPERTENSION, BENIGN SYSTEMIC   Osler-Weber-Rendu disease   DM (diabetes mellitus), type 2, uncontrolled   Obesity   Abnormal CXR   Discharge Condition: improved  Diet recommendation: diabetic/cardiac  Filed Weights   09/16/12 0500 09/16/12 0634 09/17/12 0415  Weight: 70.126 kg (154 lb 9.6 oz) 70.126 kg (154 lb 9.6 oz) 70.8 kg (156 lb 1.4 oz)    History of present illness:  [name] is a 72 y.o. 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 She also noted pale conjuctiva. Ankle swelling. No Orthopnea, PND. No recent melena, hematochesia, significant epistaxis. She is followed by Dr. [name] and recieves periodic IV iron infusions. Hgb in urgent care 6.1. Repeat in ED 7.1. 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 Urgent care note reports chest pain, but patient denies. CXR shows CHF, but proBNP only 139. Also notes blood glucose running higher than usual. Last echo in 2006 showed normal EF   Hospital Course:  ANEMIA, IRON DEFICIENCY NOS, acute on chronic. S/p 2 units pRBCs- stable- needs to follow up with Dr. 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  Continue home insulin dosing and adjust as needed.  Obesity    Procedures:  Echo: Study Conclusions  - Left ventricle: The cavity size was normal. Wall thickness was increased in a pattern of mild LVH. Systolic function was normal. The estimated ejection fraction was in the range of 60% to 65%.  Wall motion was normal; there were no regional wall motion abnormalities. Features are consistent with a pseudonormal left ventricular filling pattern, with concomitant abnormal relaxation and increased filling pressure (grade 2 diastolic dysfunction).    Consultations:  none  Discharge Exam: Filed Vitals:   09/17/12 1038  BP: 115/40  Pulse: 58  Temp:   Resp: 18    General: A+Ox3, NAd Cardiovascular: rrr Respiratory: clear  Discharge Instructions   Future Appointments Provider Department Dept Phone   09/21/2012 9:30 AM [facility] Only [place] [phone]   10/19/2012 9:30 AM [facility] Only [place] [phone]   11/28/2012 1:15 PM [place] [id number]   11/28/2012 1:45 PM Owens Shark, NP [facility] [facility] [phone]       Medication List    ASK your doctor about these medications       amitriptyline 25 MG tablet  Commonly known as:  ELAVIL  Take 25 mg by mouth At bedtime as needed for sleep.     enalapril 20 MG tablet  Commonly known as:  VASOTEC  Take 20 mg by mouth daily.     insulin glargine 100 UNIT/ML injection  Commonly known as:  LANTUS  Inject 40 Units into the skin at bedtime.     pantoprazole 40 MG tablet  Commonly known as:  PROTONIX  Take 40 mg by mouth daily as needed. For heartburn     sodium chloride 0.65 % nasal spray  Commonly known as:  Electrical engineer  1 spray into the nose as needed. For nasal congestion     vitamin B-12 1000 MCG tablet  Commonly known as:  CYANOCOBALAMIN  Take 1,000 mcg by mouth at bedtime.     Vitamin D (Ergocalciferol) 50000 UNITS Caps capsule  Commonly known as:  DRISDOL  Take 50,000 Units by mouth every 7 (seven) days. Takes on Monday       Allergies  Allergen Reactions  . Aspirin Nausea And Vomiting       Follow-up Information   Follow up with  AVBUERE,EDWIN A, MD In 1 week.   Contact information:   [place] [address] [phone]       Follow up with [name], MD In 1 month.   Contact information:   [address] [place] [address] [phone]        The results of significant diagnostics from this hospitalization (including imaging, microbiology, ancillary and laboratory) are listed below for reference.    Significant Diagnostic Studies: Dg Chest 2 View  09/15/2012   *RADIOLOGY REPORT*  Clinical Data: Weakness; history of heart murmur  CHEST - 2 VIEW  Comparison: June 04, 2012  Findings: There is generalized cardiomegaly with mild pulmonary venous hypertension.  There is mild interstitial edema.  No consolidation.  No adenopathy.  There is atherosclerotic change in the aorta.  No bone lesions.  IMPRESSION: There is evidence of a degree of congestive heart failure.  Based on the appearance of the prior chest radiograph, there may be a degree of chronic congestive heart failure.  There is no consolidation.   Original Report Authenticated By: Lowella Grip, M.D.    Microbiology: No results found for this or any previous visit (from the past 240 hour(s)).   Labs: Basic Metabolic Panel:  Recent Labs Lab 09/15/12 1451 09/17/12 0400  NA 140 143  K 4.4 3.3*  CL 105 108  CO2  --  29  GLUCOSE 255* 156*  BUN 19 15  CREATININE 0.80 0.78  CALCIUM  --  9.1   Liver Function Tests: No results found for this basename: AST, ALT, ALKPHOS, BILITOT, PROT, ALBUMIN,  in the last 168 hours No results found for this basename: LIPASE, AMYLASE,  in the last 168 hours No results found for this basename: AMMONIA,  in the last 168 hours CBC:  Recent Labs Lab 09/15/12 1404 09/15/12 1451 09/16/12 0837 09/17/12 0400 09/17/12 1240  WBC 6.4  --   --  5.9  --   HGB 6.1* 7.1* 8.4* 7.7* 8.6*  HCT 19.3* 21.0* 25.6* 23.7* 26.5*  MCV 75.1*  --   --  76.9*  --   PLT 196  --   --  161  --     Cardiac Enzymes:  Recent Labs Lab 09/15/12 1706  TROPONINI <0.30   BNP: BNP (last 3 results)  Recent Labs  09/15/12 1706  PROBNP 139.3*   CBG:  Recent Labs Lab 09/16/12 1053 09/16/12 1611 09/16/12 2137 09/17/12 0548 09/17/12 1109  GLUCAP 365* 172* 205* 108* 221*       Signed:  Penn Grissett  [facility] 09/17/2012, 1:40 PM

## 2012-09-17 NOTE — Progress Notes (Signed)
Pt Alert and oriented in no apparent distress. Pt rested comfortably this shift on room air. Pt OOB to bathroom with minimal assist. Pt steady on feet. Shelby Mattocks, RN

## 2012-09-17 NOTE — Progress Notes (Signed)
1500 discharge instructions given to pt > Verbalized understanding.

## 2012-09-17 NOTE — ED Provider Notes (Signed)
I performed a history and physical examination of  [name] and discussed her management with Dr. Kerin Ransom. I agree with the history, physical, assessment, and plan of care, with the following exceptions: None I was present for the following procedures: None  Time Spent in Critical Care of the patient: 30 minutes  Time spent in discussions with the patient and family: 10 minutes  [name]  Pt comes in with symptomatic anemia. Hb is 6. Pt has AVM hx. Guaiac negative. Will admit. Will transfuse.   Varney Biles, MD 09/17/12 [phone]

## 2012-09-21 ENCOUNTER — Other Ambulatory Visit (HOSPITAL_BASED_OUTPATIENT_CLINIC_OR_DEPARTMENT_OTHER): Payer: Medicare Other | Admitting: Lab

## 2012-09-21 DIAGNOSIS — K31819 Angiodysplasia of stomach and duodenum without bleeding: Secondary | ICD-10-CM

## 2012-09-21 DIAGNOSIS — I10 Essential (primary) hypertension: Secondary | ICD-10-CM

## 2012-09-21 DIAGNOSIS — Q2733 Arteriovenous malformation of digestive system vessel: Secondary | ICD-10-CM

## 2012-09-21 DIAGNOSIS — D509 Iron deficiency anemia, unspecified: Secondary | ICD-10-CM

## 2012-09-21 DIAGNOSIS — I780 Hereditary hemorrhagic telangiectasia: Secondary | ICD-10-CM

## 2012-09-21 LAB — CBC WITH DIFFERENTIAL/PLATELET
Basophils Absolute: 0 10*3/uL (ref 0.0–0.1)
EOS%: 6.1 % (ref 0.0–7.0)
HCT: 24.8 % — ABNORMAL LOW (ref 34.8–46.6)
HGB: 8.2 g/dL — ABNORMAL LOW (ref 11.6–15.9)
LYMPH%: 12.2 % — ABNORMAL LOW (ref 14.0–49.7)
MCH: 24.9 pg — ABNORMAL LOW (ref 25.1–34.0)
MCV: 75.6 fL — ABNORMAL LOW (ref 79.5–101.0)
MONO%: 10.7 % (ref 0.0–14.0)
NEUT%: 70.2 % (ref 38.4–76.8)
Platelets: 184 10*3/uL (ref 145–400)

## 2012-09-22 ENCOUNTER — Other Ambulatory Visit: Payer: Self-pay | Admitting: *Deleted

## 2012-09-23 ENCOUNTER — Telehealth: Payer: Self-pay | Admitting: *Deleted

## 2012-09-23 ENCOUNTER — Other Ambulatory Visit: Payer: Self-pay | Admitting: Oncology

## 2012-09-23 ENCOUNTER — Other Ambulatory Visit: Payer: Self-pay | Admitting: *Deleted

## 2012-09-23 NOTE — Telephone Encounter (Signed)
Per voicemail message I have added appt for patient.  JMW

## 2012-09-23 NOTE — Telephone Encounter (Signed)
I have called and gave patient the appt for Monday

## 2012-09-26 ENCOUNTER — Ambulatory Visit (HOSPITAL_BASED_OUTPATIENT_CLINIC_OR_DEPARTMENT_OTHER): Payer: Medicare Other

## 2012-09-26 VITALS — BP 153/45 | HR 68 | Temp 98.2°F | Resp 20 | Ht 63.0 in

## 2012-09-26 DIAGNOSIS — K922 Gastrointestinal hemorrhage, unspecified: Secondary | ICD-10-CM

## 2012-09-26 DIAGNOSIS — D509 Iron deficiency anemia, unspecified: Secondary | ICD-10-CM

## 2012-09-26 DIAGNOSIS — I780 Hereditary hemorrhagic telangiectasia: Secondary | ICD-10-CM

## 2012-09-26 DIAGNOSIS — D649 Anemia, unspecified: Secondary | ICD-10-CM

## 2012-09-26 DIAGNOSIS — K31819 Angiodysplasia of stomach and duodenum without bleeding: Secondary | ICD-10-CM

## 2012-09-26 MED ORDER — SODIUM CHLORIDE 0.9 % IJ SOLN
3.0000 mL | Freq: Once | INTRAMUSCULAR | Status: DC | PRN
Start: 2012-09-26 — End: 2012-09-26
  Filled 2012-09-26: qty 10

## 2012-09-26 MED ORDER — SODIUM CHLORIDE 0.9 % IV SOLN
Freq: Once | INTRAVENOUS | Status: AC
  Administered 2012-09-26: 12:00:00 via INTRAVENOUS

## 2012-09-26 MED ORDER — SODIUM CHLORIDE 0.9 % IV SOLN
1020.0000 mg | Freq: Once | INTRAVENOUS | Status: AC
  Administered 2012-09-26: 1020 mg via INTRAVENOUS
  Filled 2012-09-26: qty 34

## 2012-09-26 NOTE — Patient Instructions (Addendum)
Ferumoxytol injection What is this medicine? FERUMOXYTOL is an iron complex. Iron is used to make healthy red blood cells, which carry oxygen and nutrients throughout the body. This medicine is used to treat iron deficiency anemia in people with chronic kidney disease. This medicine may be used for other purposes; ask your health care provider or pharmacist if you have questions. What should I tell my health care provider before I take this medicine? They need to know if you have any of these conditions: -anemia not caused by low iron levels -high levels of iron in the blood -magnetic resonance imaging (MRI) test scheduled -an unusual or allergic reaction to iron, other medicines, foods, dyes, or preservatives -pregnant or trying to get pregnant -breast-feeding How should I use this medicine? This medicine is for infusion into a vein. It is given by a health care professional in a hospital or clinic setting. Talk to your pediatrician regarding the use of this medicine in children. Special care may be needed. Overdosage: If you think you've taken too much of this medicine contact a poison control center or emergency room at once. Overdosage: If you think you have taken too much of this medicine contact a poison control center or emergency room at once. NOTE: This medicine is only for you. Do not share this medicine with others. What if I miss a dose? It is important not to miss your dose. Call your doctor or health care professional if you are unable to keep an appointment. What may interact with this medicine? This medicine may interact with the following medications: -other iron products This list may not describe all possible interactions. Give your health care provider a list of all the medicines, herbs, non-prescription drugs, or dietary supplements you use. Also tell them if you smoke, drink alcohol, or use illegal drugs. Some items may interact with your medicine. What should I watch  for while using this medicine? Visit your doctor or healthcare professional regularly. Tell your doctor or healthcare professional if your symptoms do not start to get better or if they get worse. You may need blood work done while you are taking this medicine. You may need to follow a special diet. Talk to your doctor. Foods that contain iron include: whole grains/cereals, dried fruits, beans, or peas, leafy green vegetables, and organ meats (liver, kidney). What side effects may I notice from receiving this medicine? Side effects that you should report to your doctor or health care professional as soon as possible: -allergic reactions like skin rash, itching or hives, swelling of the face, lips, or tongue -breathing problems -changes in blood pressure -feeling faint or lightheaded, falls -fever or chills -flushing, sweating, or hot feelings -swelling of the ankles or feet Side effects that usually do not require medical attention (Report these to your doctor or health care professional if they continue or are bothersome.): -diarrhea -headache -nausea, vomiting -stomach pain This list may not describe all possible side effects. Call your doctor for medical advice about side effects. You may report side effects to FDA at 1-800-FDA-1088. Where should I keep my medicine? This drug is given in a hospital or clinic and will not be stored at home. NOTE: This sheet is a summary. It may not cover all possible information. If you have questions about this medicine, talk to your doctor, pharmacist, or health care provider.  2013, [employer]. (10/19/2007 9:48:25 PM)  

## 2012-10-19 ENCOUNTER — Other Ambulatory Visit (HOSPITAL_BASED_OUTPATIENT_CLINIC_OR_DEPARTMENT_OTHER): Payer: Medicare Other | Admitting: Lab

## 2012-10-19 DIAGNOSIS — K31819 Angiodysplasia of stomach and duodenum without bleeding: Secondary | ICD-10-CM

## 2012-10-19 DIAGNOSIS — I780 Hereditary hemorrhagic telangiectasia: Secondary | ICD-10-CM

## 2012-10-19 DIAGNOSIS — I10 Essential (primary) hypertension: Secondary | ICD-10-CM

## 2012-10-19 DIAGNOSIS — D509 Iron deficiency anemia, unspecified: Secondary | ICD-10-CM

## 2012-10-19 LAB — CBC WITH DIFFERENTIAL/PLATELET
BASO%: 0.4 % (ref 0.0–2.0)
LYMPH%: 18.4 % (ref 14.0–49.7)
MCHC: 32.3 g/dL (ref 31.5–36.0)
MCV: 82.6 fL (ref 79.5–101.0)
MONO%: 10.1 % (ref 0.0–14.0)
NEUT%: 65.3 % (ref 38.4–76.8)
Platelets: 203 10*3/uL (ref 145–400)
RBC: 3.56 10*6/uL — ABNORMAL LOW (ref 3.70–5.45)
nRBC: 0 % (ref 0–0)

## 2012-11-07 ENCOUNTER — Encounter (HOSPITAL_COMMUNITY): Payer: Self-pay | Admitting: Family Medicine

## 2012-11-07 ENCOUNTER — Inpatient Hospital Stay (HOSPITAL_COMMUNITY)
Admission: EM | Admit: 2012-11-07 | Discharge: 2012-11-10 | DRG: 300 | Disposition: A | Discharge status: Home / Self Care | Payer: Medicare Other | Attending: Internal Medicine | Admitting: Internal Medicine

## 2012-11-07 DIAGNOSIS — R531 Weakness: Secondary | ICD-10-CM

## 2012-11-07 DIAGNOSIS — E119 Type 2 diabetes mellitus without complications: Secondary | ICD-10-CM | POA: Diagnosis present

## 2012-11-07 DIAGNOSIS — Z87891 Personal history of nicotine dependence: Secondary | ICD-10-CM

## 2012-11-07 DIAGNOSIS — E1129 Type 2 diabetes mellitus with other diabetic kidney complication: Secondary | ICD-10-CM | POA: Diagnosis present

## 2012-11-07 DIAGNOSIS — K5520 Angiodysplasia of colon without hemorrhage: Secondary | ICD-10-CM | POA: Diagnosis present

## 2012-11-07 DIAGNOSIS — K922 Gastrointestinal hemorrhage, unspecified: Secondary | ICD-10-CM

## 2012-11-07 DIAGNOSIS — K31819 Angiodysplasia of stomach and duodenum without bleeding: Secondary | ICD-10-CM

## 2012-11-07 DIAGNOSIS — E118 Type 2 diabetes mellitus with unspecified complications: Secondary | ICD-10-CM

## 2012-11-07 DIAGNOSIS — E785 Hyperlipidemia, unspecified: Secondary | ICD-10-CM

## 2012-11-07 DIAGNOSIS — D539 Nutritional anemia, unspecified: Secondary | ICD-10-CM | POA: Diagnosis present

## 2012-11-07 DIAGNOSIS — I780 Hereditary hemorrhagic telangiectasia: Secondary | ICD-10-CM | POA: Diagnosis present

## 2012-11-07 DIAGNOSIS — D509 Iron deficiency anemia, unspecified: Secondary | ICD-10-CM

## 2012-11-07 DIAGNOSIS — R9389 Abnormal findings on diagnostic imaging of other specified body structures: Secondary | ICD-10-CM

## 2012-11-07 DIAGNOSIS — E669 Obesity, unspecified: Secondary | ICD-10-CM

## 2012-11-07 DIAGNOSIS — I10 Essential (primary) hypertension: Secondary | ICD-10-CM

## 2012-11-07 DIAGNOSIS — E876 Hypokalemia: Secondary | ICD-10-CM

## 2012-11-07 DIAGNOSIS — D649 Anemia, unspecified: Secondary | ICD-10-CM | POA: Diagnosis present

## 2012-11-07 DIAGNOSIS — E1165 Type 2 diabetes mellitus with hyperglycemia: Secondary | ICD-10-CM

## 2012-11-07 DIAGNOSIS — D62 Acute posthemorrhagic anemia: Secondary | ICD-10-CM

## 2012-11-07 DIAGNOSIS — Z8774 Personal history of (corrected) congenital malformations of heart and circulatory system: Secondary | ICD-10-CM

## 2012-11-07 DIAGNOSIS — Z794 Long term (current) use of insulin: Secondary | ICD-10-CM

## 2012-11-07 DIAGNOSIS — Q2733 Arteriovenous malformation of digestive system vessel: Principal | ICD-10-CM

## 2012-11-07 DIAGNOSIS — IMO0001 Reserved for inherently not codable concepts without codable children: Secondary | ICD-10-CM | POA: Diagnosis present

## 2012-11-07 DIAGNOSIS — R040 Epistaxis: Secondary | ICD-10-CM

## 2012-11-07 DIAGNOSIS — I509 Heart failure, unspecified: Secondary | ICD-10-CM | POA: Diagnosis present

## 2012-11-07 LAB — COMPREHENSIVE METABOLIC PANEL
AST: 17 U/L (ref 0–37)
BUN: 19 mg/dL (ref 6–23)
CO2: 23 mEq/L (ref 19–32)
Calcium: 8.9 mg/dL (ref 8.4–10.5)
Chloride: 98 mEq/L (ref 96–112)
Creatinine, Ser: 1.09 mg/dL (ref 0.50–1.10)
GFR calc Af Amer: 57 mL/min — ABNORMAL LOW (ref >90)
GFR calc non Af Amer: 49 mL/min — ABNORMAL LOW (ref >90)
Glucose, Bld: 324 mg/dL — ABNORMAL HIGH (ref 70–99)
Total Bilirubin: 0.3 mg/dL (ref 0.3–1.2)

## 2012-11-07 LAB — CBC WITH DIFFERENTIAL/PLATELET
Basophils Absolute: 0 10*3/uL (ref 0.0–0.1)
Eosinophils Relative: 5 % (ref 0–5)
HCT: 18.2 % — ABNORMAL LOW (ref 36.0–46.0)
Hemoglobin: 5.7 g/dL — CL (ref 12.0–15.0)
Lymphocytes Relative: 19 % (ref 12–46)
MCV: 80.2 fL (ref 78.0–100.0)
Monocytes Absolute: 0.4 10*3/uL (ref 0.1–1.0)
Monocytes Relative: 8 % (ref 3–12)
RDW: 19.5 % — ABNORMAL HIGH (ref 11.5–15.5)
WBC: 5.3 10*3/uL (ref 4.0–10.5)

## 2012-11-07 LAB — PREPARE RBC (CROSSMATCH)

## 2012-11-07 NOTE — ED Provider Notes (Signed)
CSN: [id number]     Arrival date & time 11/07/12  2006 History   First MD Initiated Contact with Patient 11/07/12 2113     Chief Complaint  Patient presents with  . Abnormal Lab    Hgb 6.1   (Consider location/radiation/quality/duration/timing/severity/associated sxs/prior Treatment) Patient is a 72 y.o. female presenting with general illness. The history is provided by the patient.  Illness Location:  Exertional dyspnea Quality:  Moderate Severity:  Moderate Onset quality:  Gradual Timing:  Constant Progression:  Unchanged Chronicity:  Recurrent Context:  Hx of symptomatic anemia requiring transfusions Relieved by:  Nothing Worsened by:  Nothing Associated symptoms: no cough, no fever and no shortness of breath     Past Medical History  Diagnosis Date  . Anemia   . S/P endoscopy     with laser treatment  08/19/2006  . History of epistaxis   . Hypertension   . Hyperlipidemia   . Telangiectasia     Gastric   . Lichen planus     Both lower extremities  . GERD (gastroesophageal reflux disease)   . Heart murmur   . HTN (hypertension), benign 03/02/2012  . Family history of anesthesia complication     "niece has a hard time coming out" (09/15/2012)  . CHF (congestive heart failure)   . Pneumonia 1990's    "twice" (09/15/2012)  . Type II diabetes mellitus     insulin requiring.  Marland Kitchen History of blood transfusion     "getting 2 today; have had transfusions before" (09/15/2012)  . Migraines     "healed of them over 40 yr ago" (09/15/2012)   Past Surgical History  Procedure Laterality Date  . Nose surgery      "for bleeding" (09/15/2012)  . Savory dilation  02/26/2011    Procedure: SAVORY DILATION;  Surgeon: [name], MD;  Location: [name];  Service: Endoscopy;  Laterality: N/A;  c-arm needed  . Esophagogastroduodenoscopy  02/26/2011    Procedure: ESOPHAGOGASTRODUODENOSCOPY (EGD);  Surgeon: [name], MD;  Location: [name];  Service: Endoscopy;  Laterality: N/A;    Family History  Problem Relation Age of Onset  . Breast cancer    . Malignant hyperthermia Neg Hx   . Stroke Father    History  Substance Use Topics  . Smoking status: Former Smoker -- 2 years    Types: Cigarettes  . Smokeless tobacco: Never Used     Comment: 09/15/2012 "smoked 50-60 yr ago"  . Alcohol Use: No   OB History   Grav Para Term Preterm Abortions TAB SAB Ect Mult Living                 Review of Systems  Constitutional: Negative for fever.  Respiratory: Negative for cough and shortness of breath.   All other systems reviewed and are negative.    Allergies  Aspirin  Home Medications   Current Outpatient Rx  Name  Route  Sig  Dispense  Refill  . amitriptyline (ELAVIL) 25 MG tablet   Oral   Take 25 mg by mouth At bedtime as needed for sleep.          . enalapril (VASOTEC) 20 MG tablet   Oral   Take 20 mg by mouth daily.         . insulin aspart (NOVOLOG) 100 UNIT/ML injection   Subcutaneous   Inject 3-12 Units into the skin 3 (three) times daily with meals. Sliding Scale         .  insulin lispro (HUMALOG) 100 UNIT/ML injection   Subcutaneous   Inject 45 Units into the skin at bedtime.         . pantoprazole (PROTONIX) 40 MG tablet   Oral   Take 40 mg by mouth daily as needed. For heartburn         . sodium chloride (OCEAN) 0.65 % nasal spray   Nasal   Place 1 spray into the nose as needed. For nasal congestion         . vitamin B-12 (CYANOCOBALAMIN) 1000 MCG tablet   Oral   Take 1,000 mcg by mouth at bedtime.          . Vitamin D, Ergocalciferol, (DRISDOL) 50000 UNITS CAPS   Oral   Take 50,000 Units by mouth every 7 (seven) days. Takes on Monday          BP 112/53  Pulse 77  Temp(Src) 98.3 F (36.8 C) (Oral)  Resp 19  Ht 5\' 3"  (1.6 m)  Wt 154 lb (69.854 kg)  BMI 27.29 kg/m2  SpO2 99% Physical Exam  Nursing note and vitals reviewed. Constitutional: She is oriented to person, place, and time. She appears  well-developed and well-nourished. No distress.  HENT:  Head: Normocephalic and atraumatic.  Eyes: EOM are normal. Pupils are equal, round, and reactive to light.  Neck: Normal range of motion. Neck supple.  Cardiovascular: Normal rate and regular rhythm.  Exam reveals no friction rub.   No murmur heard. Pulmonary/Chest: Effort normal and breath sounds normal. No respiratory distress. She has no wheezes. She has no rales.  Abdominal: Soft. She exhibits no distension. There is no tenderness. There is no rebound.  Musculoskeletal: Normal range of motion. She exhibits no edema.  Neurological: She is alert and oriented to person, place, and time.  Skin: She is not diaphoretic.    ED Course  Procedures (including critical care time) Labs Review Labs Reviewed  GLUCOSE, CAPILLARY - Abnormal; Notable for the following:    Glucose-Capillary 334 (*)    All other components within normal limits  CBC WITH DIFFERENTIAL - Abnormal; Notable for the following:    RBC 2.27 (*)    Hemoglobin 5.7 (*)    HCT 18.2 (*)    MCH 25.1 (*)    RDW 19.5 (*)    All other components within normal limits  COMPREHENSIVE METABOLIC PANEL - Abnormal; Notable for the following:    Sodium 132 (*)    Glucose, Bld 324 (*)    Albumin 3.1 (*)    GFR calc non Af Amer 49 (*)    GFR calc Af Amer 57 (*)    All other components within normal limits  TYPE AND SCREEN  PREPARE RBC (CROSSMATCH)   Imaging Review No results found.  MDM   1. Anemia    67F with Ruben Reason Rendu presents with anemia. Sent by her primary doctor, labs checked last Friday, Hgb resulted at 6. Hgb 5.7 here, no GI losses per her. Symptomatic with exertional dyspnea. No CP, SOB at rest. No syncope. Patient admitted to telemetry bed. Blood ordered.     Osvaldo Shipper, MD 11/07/12 2325

## 2012-11-07 NOTE — ED Notes (Signed)
Dr Mingo Amber notified of hemoglobin 5.7

## 2012-11-07 NOTE — ED Notes (Signed)
Patient states that she went to her PCP on Friday and had blood work drawn. Today her PCP called and said her Hgb was 6.0. Patient states she has had shortness of breath, weakness, dizziness and cramping over the weekend.

## 2012-11-08 ENCOUNTER — Encounter (HOSPITAL_COMMUNITY)
Admission: EM | Disposition: A | Discharge status: Home / Self Care | Payer: Self-pay | Source: Home / Self Care | Attending: Internal Medicine

## 2012-11-08 ENCOUNTER — Encounter (HOSPITAL_COMMUNITY): Payer: Self-pay | Admitting: Internal Medicine

## 2012-11-08 DIAGNOSIS — D649 Anemia, unspecified: Secondary | ICD-10-CM

## 2012-11-08 DIAGNOSIS — E118 Type 2 diabetes mellitus with unspecified complications: Secondary | ICD-10-CM

## 2012-11-08 DIAGNOSIS — Q2733 Arteriovenous malformation of digestive system vessel: Principal | ICD-10-CM

## 2012-11-08 HISTORY — PX: ESOPHAGOGASTRODUODENOSCOPY: SHX5428

## 2012-11-08 LAB — CBC
HCT: 23.1 % — ABNORMAL LOW (ref 36.0–46.0)
Hemoglobin: 7.5 g/dL — ABNORMAL LOW (ref 12.0–15.0)
Hemoglobin: 7.7 g/dL — ABNORMAL LOW (ref 12.0–15.0)
MCH: 26 pg (ref 26.0–34.0)
MCH: 25.7 pg — ABNORMAL LOW (ref 26.0–34.0)
MCHC: 32.5 g/dL (ref 30.0–36.0)
MCHC: 31.6 g/dL (ref 30.0–36.0)
MCV: 81.3 fL (ref 78.0–100.0)
Platelets: 189 10*3/uL (ref 150–400)
RBC: 3 MIL/uL — ABNORMAL LOW (ref 3.87–5.11)
RDW: 16.7 % — ABNORMAL HIGH (ref 11.5–15.5)

## 2012-11-08 LAB — BASIC METABOLIC PANEL
BUN: 16 mg/dL (ref 6–23)
CO2: 23 mEq/L (ref 19–32)
Chloride: 105 mEq/L (ref 96–112)
Creatinine, Ser: 0.97 mg/dL (ref 0.50–1.10)
Glucose, Bld: 290 mg/dL — ABNORMAL HIGH (ref 70–99)
Potassium: 4.3 mEq/L (ref 3.5–5.1)

## 2012-11-08 LAB — GLUCOSE, CAPILLARY
Glucose-Capillary: 223 mg/dL — ABNORMAL HIGH (ref 70–99)
Glucose-Capillary: 261 mg/dL — ABNORMAL HIGH (ref 70–99)
Glucose-Capillary: 292 mg/dL — ABNORMAL HIGH (ref 70–99)

## 2012-11-08 LAB — HEMOGLOBIN A1C
Hgb A1c MFr Bld: 6.6 % — ABNORMAL HIGH (ref <5.7)
Mean Plasma Glucose: 143 mg/dL — ABNORMAL HIGH (ref <117)

## 2012-11-08 LAB — OCCULT BLOOD X 1 CARD TO LAB, STOOL: Fecal Occult Bld: POSITIVE — AB

## 2012-11-08 SURGERY — EGD (ESOPHAGOGASTRODUODENOSCOPY)
Anesthesia: Moderate Sedation

## 2012-11-08 MED ORDER — SODIUM CHLORIDE 0.9 % IV SOLN
INTRAVENOUS | Status: DC
  Administered 2012-11-08: 04:00:00 via INTRAVENOUS

## 2012-11-08 MED ORDER — ENALAPRIL MALEATE 20 MG PO TABS
20.0000 mg | ORAL_TABLET | Freq: Every day | ORAL | Status: DC
  Filled 2012-11-08: qty 1

## 2012-11-08 MED ORDER — ACETAMINOPHEN 325 MG PO TABS: 650.0000 mg | ORAL_TABLET | Freq: Four times a day (QID) | ORAL | Status: DC | PRN

## 2012-11-08 MED ORDER — VITAMIN B-12 1000 MCG PO TABS
1000.0000 ug | ORAL_TABLET | Freq: Every day | ORAL | Status: DC
  Administered 2012-11-08 – 2012-11-09 (×2): 1000 ug via ORAL
  Filled 2012-11-08 (×4): qty 1

## 2012-11-08 MED ORDER — ONDANSETRON HCL 4 MG PO TABS: 4.0000 mg | ORAL_TABLET | Freq: Four times a day (QID) | ORAL | Status: DC | PRN

## 2012-11-08 MED ORDER — AMITRIPTYLINE HCL 25 MG PO TABS
25.0000 mg | ORAL_TABLET | Freq: Every day | ORAL | Status: DC
  Administered 2012-11-08 – 2012-11-09 (×2): 25 mg via ORAL
  Filled 2012-11-08 (×4): qty 1

## 2012-11-08 MED ORDER — FENTANYL CITRATE 0.05 MG/ML IJ SOLN
INTRAMUSCULAR | Status: DC | PRN
Start: 2012-11-08 — End: 2012-11-08
  Administered 2012-11-08 (×3): 25 ug via INTRAVENOUS

## 2012-11-08 MED ORDER — PANTOPRAZOLE SODIUM 40 MG IV SOLR
40.0000 mg | Freq: Two times a day (BID) | INTRAVENOUS | Status: DC
  Administered 2012-11-08 – 2012-11-09 (×3): 40 mg via INTRAVENOUS
  Filled 2012-11-08 (×6): qty 40

## 2012-11-08 MED ORDER — INSULIN DETEMIR 100 UNIT/ML ~~LOC~~ SOLN
25.0000 [IU] | Freq: Once | SUBCUTANEOUS | Status: AC
  Administered 2012-11-08: 25 [IU] via SUBCUTANEOUS
  Filled 2012-11-08: qty 0.25

## 2012-11-08 MED ORDER — GLUCAGON HCL (RDNA) 1 MG IJ SOLR
INTRAMUSCULAR | Status: AC
  Filled 2012-11-08: qty 1

## 2012-11-08 MED ORDER — FENTANYL CITRATE 0.05 MG/ML IJ SOLN
INTRAMUSCULAR | Status: AC
  Filled 2012-11-08: qty 2

## 2012-11-08 MED ORDER — PANTOPRAZOLE SODIUM 40 MG PO TBEC
40.0000 mg | DELAYED_RELEASE_TABLET | Freq: Every day | ORAL | Status: DC
  Administered 2012-11-08: 11:00:00 40 mg via ORAL
  Filled 2012-11-08: qty 1

## 2012-11-08 MED ORDER — SODIUM CHLORIDE 0.9 % IV SOLN
INTRAVENOUS | Status: DC
  Administered 2012-11-08: 21:00:00 via INTRAVENOUS
  Administered 2012-11-08: 17:00:00 20 mL/h via INTRAVENOUS

## 2012-11-08 MED ORDER — DIPHENHYDRAMINE HCL 50 MG/ML IJ SOLN
INTRAMUSCULAR | Status: AC
  Filled 2012-11-08: qty 1

## 2012-11-08 MED ORDER — INSULIN ASPART 100 UNIT/ML ~~LOC~~ SOLN
0.0000 [IU] | Freq: Three times a day (TID) | SUBCUTANEOUS | Status: DC
  Administered 2012-11-08: 08:00:00 3 [IU] via SUBCUTANEOUS
  Administered 2012-11-08 – 2012-11-09 (×3): 2 [IU] via SUBCUTANEOUS
  Administered 2012-11-09: 18:00:00 5 [IU] via SUBCUTANEOUS
  Administered 2012-11-10 (×2): 3 [IU] via SUBCUTANEOUS

## 2012-11-08 MED ORDER — INSULIN ASPART 100 UNIT/ML ~~LOC~~ SOLN
5.0000 [IU] | Freq: Once | SUBCUTANEOUS | Status: AC
  Administered 2012-11-08: 5 [IU] via SUBCUTANEOUS

## 2012-11-08 MED ORDER — ONDANSETRON HCL 4 MG/2ML IJ SOLN: 4.0000 mg | Freq: Four times a day (QID) | INTRAMUSCULAR | Status: DC | PRN

## 2012-11-08 MED ORDER — BUTAMBEN-TETRACAINE-BENZOCAINE 2-2-14 % EX AERO
INHALATION_SPRAY | CUTANEOUS | Status: DC | PRN
  Administered 2012-11-08: 1 via TOPICAL

## 2012-11-08 MED ORDER — SODIUM CHLORIDE 0.9 % IJ SOLN
3.0000 mL | Freq: Two times a day (BID) | INTRAMUSCULAR | Status: DC
  Administered 2012-11-08 – 2012-11-09 (×4): 3 mL via INTRAVENOUS

## 2012-11-08 MED ORDER — SALINE SPRAY 0.65 % NA SOLN
1.0000 | NASAL | Status: DC | PRN
  Filled 2012-11-08: qty 44

## 2012-11-08 MED ORDER — MIDAZOLAM HCL 10 MG/2ML IJ SOLN
INTRAMUSCULAR | Status: DC | PRN
  Administered 2012-11-08 (×3): 1 mg via INTRAVENOUS
  Administered 2012-11-08: 2 mg via INTRAVENOUS
  Administered 2012-11-08: 1 mg via INTRAVENOUS
  Administered 2012-11-08: 2 mg via INTRAVENOUS

## 2012-11-08 MED ORDER — GLUCAGON HCL (RDNA) 1 MG IJ SOLR
INTRAMUSCULAR | Status: DC | PRN
  Administered 2012-11-08: .5 mg via INTRAVENOUS

## 2012-11-08 MED ORDER — MIDAZOLAM HCL 10 MG/2ML IJ SOLN
INTRAMUSCULAR | Status: AC
  Filled 2012-11-08: qty 2

## 2012-11-08 MED ORDER — ACETAMINOPHEN 650 MG RE SUPP: 650.0000 mg | Freq: Four times a day (QID) | RECTAL | Status: DC | PRN

## 2012-11-08 NOTE — Progress Notes (Signed)
Inpatient Diabetes Program Recommendations  AACE/ADA: New Consensus Statement on Inpatient Glycemic Control (2013)  Target Ranges:  Prepandial:   less than 140 mg/dL      Peak postprandial:   less than 180 mg/dL (1-2 hours)      Critically ill patients:  140 - 180 mg/dL   Reason for Visit: Hyperglycemia and clarification of home meds  Results for [name], [name] (MRN [id number]) as of 11/08/2012 13:58  Ref. Range 11/07/2012 20:40 11/08/2012 00:58  Glucose-Capillary Latest Range: 70-99 mg/dL 334 (H) 223 (H)   Note:  CBG's elevated.  Med Rec documentation listed Novolog correction scale and Humalog 45 units at HS.  Talked with patient-- husband present to clarify home diabetes meds.  Patient states she had been on Lantus 45 units at [facility] but when she saw Dr. Jeanie Cooks last Friday he changed it to Levemir 45 units at bedtime-- not Humalog as listed in Med Rec.  Does take Novolog by sliding scale at home.  Telephone call to pharmacy to correct entry.  Request that MD consider restarting half to full home dose of Levemir insulin to help improve glycemic control.  Thank you.  Melvine Julin S. Marcelline Mates, RN, CNS, CDE Inpatient Diabetes Program, team pager [phone]

## 2012-11-08 NOTE — H&P (Signed)
[facility] History and [place] [id number] DOB: 06/23/1940 DOA: 11/07/2012  Referring physician: ER physician. PCP: Philis Fendt, MD  Specialists: Dr. [name].  Chief Complaint: Low hemoglobin.  HPI: [name] is a 72 y.o. 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 At that time patient also had found that if she has been having uncontrolled blood sugar. Her PCP had done some blood work and call her yesterday stating that her hemoglobin was low and was advised to go to the ER. Hemoglobin is around 5 and at this time patient has been admitted for transfusion and symptomatic management. Patient states that she has been feeling weak last few days with shortness of breath on minimal exertion. Patient denies any rectal bleed or black stools. Her last epistaxis was 3 weeks ago which was self-limited. Patient follows a dermatologist for IV and and is scheduled to follow next month 20th.  Review of Systems:  As presented in the history of presenting illness, rest negative.  Past Medical History  Diagnosis Date  . Anemia   . S/P endoscopy     with laser treatment  08/19/2006  . History of epistaxis   . Hypertension   . Hyperlipidemia   . Telangiectasia     Gastric   . Lichen planus     Both lower extremities  . GERD (gastroesophageal reflux disease)   . Heart murmur   . HTN (hypertension), benign 03/02/2012  . Family history of anesthesia complication     "niece has a hard time coming out" (09/15/2012)  . CHF (congestive heart failure)   . Pneumonia 1990's    "twice" (09/15/2012)  . Type II diabetes mellitus     insulin requiring.  Marland Kitchen History of blood transfusion     "getting 2 today; have  had transfusions before" (09/15/2012)  . Migraines     "healed of them over 40 yr ago" (09/15/2012)   Past Surgical History  Procedure Laterality Date  . Nose surgery      "for bleeding" (09/15/2012)  . Savory dilation  02/26/2011    Procedure: SAVORY DILATION;  Surgeon: [name], MD;  Location: [name];  Service: Endoscopy;  Laterality: N/A;  c-arm needed  . Esophagogastroduodenoscopy  02/26/2011    Procedure: ESOPHAGOGASTRODUODENOSCOPY (EGD);  Surgeon: [name], MD;  Location: [name];  Service: Endoscopy;  Laterality: N/A;   Social History:  reports that she has quit smoking. Her smoking use included Cigarettes. She smoked 0.00 packs per day for 2 years. She has never used smokeless tobacco. She reports that she does not drink alcohol or use illicit drugs. Home. where does patient live-- yes Can patient participate in ADLs?  Allergies  Allergen Reactions  . Aspirin Nausea And Vomiting    Family History  Problem Relation Age of Onset  . Breast cancer    . Malignant hyperthermia Neg Hx   . Stroke Father       Prior to Admission medications   Medication Sig Start Date End Date Taking? Authorizing Provider  amitriptyline (ELAVIL) 25 MG tablet Take 25 mg by mouth At bedtime as needed for sleep.  01/05/12  Yes Historical Provider, MD  enalapril (VASOTEC) 20 MG  tablet Take 20 mg by mouth daily.   Yes Historical Provider, MD  insulin aspart (NOVOLOG) 100 UNIT/ML injection Inject 3-12 Units into the skin 3 (three) times daily with meals. Sliding Scale   Yes Historical Provider, MD  insulin lispro (HUMALOG) 100 UNIT/ML injection Inject 45 Units into the skin at bedtime.   Yes Historical Provider, MD  pantoprazole (PROTONIX) 40 MG tablet Take 40 mg by mouth daily as needed. For heartburn   Yes Historical Provider, MD  sodium chloride (OCEAN) 0.65 % nasal spray Place 1 spray into the nose as needed. For nasal congestion   Yes Historical Provider, MD  vitamin B-12  (CYANOCOBALAMIN) 1000 MCG tablet Take 1,000 mcg by mouth at bedtime.    Yes Historical Provider, MD  Vitamin D, Ergocalciferol, (DRISDOL) 50000 UNITS CAPS Take 50,000 Units by mouth every 7 (seven) days. Takes on Monday   Yes Historical Provider, MD   Physical Exam: Filed Vitals:   11/07/12 2228 11/07/12 2238 11/07/12 2245 11/07/12 2305  BP: 139/39 139/39  112/53  Pulse: 73 73 71 77  Temp:  98.1 F (36.7 C)  98.3 F (36.8 C)  TempSrc:  Oral    Resp:      Height:      Weight:      SpO2: 96%  96% 99%     General:  Well-developed and well-nourished.  Eyes: Pallor present no icterus.  ENT: No discharge from ears eyes nose mouth.  Neck: No mass felt.  Cardiovascular: S1-S2 heard.  Respiratory: No rhonchi or crepitations.  Abdomen: Soft nontender bowel sounds present.  Skin: No rash.  Musculoskeletal: No edema.  Psychiatric: Appears normal.  Neurologic: Alert awake oriented to time place and person. Moves all extremities.  Labs on Admission:  Basic Metabolic Panel:  Recent Labs Lab 11/07/12 2121  NA 132*  K 3.8  CL 98  CO2 23  GLUCOSE 324*  BUN 19  CREATININE 1.09  CALCIUM 8.9   Liver Function Tests:  Recent Labs Lab 11/07/12 2121  AST 17  ALT 15  ALKPHOS 82  BILITOT 0.3  PROT 6.2  ALBUMIN 3.1*   No results found for this basename: LIPASE, AMYLASE,  in the last 168 hours No results found for this basename: AMMONIA,  in the last 168 hours CBC:  Recent Labs Lab 11/07/12 2121  WBC 5.3  NEUTROABS 3.6  HGB 5.7*  HCT 18.2*  MCV 80.2  PLT 215   Cardiac Enzymes: No results found for this basename: CKTOTAL, CKMB, CKMBINDEX, TROPONINI,  in the last 168 hours  BNP (last 3 results)  Recent Labs  09/15/12 1706  PROBNP 139.3*   CBG:  Recent Labs Lab 11/07/12 2040  GLUCAP 334*    Radiological Exams on Admission: No results found.   Assessment/Plan Principal Problem:   Anemia Active Problems:   Osler-Weber-Rendu disease   DM  (diabetes mellitus), type 2, uncontrolled   1. Symptomatic anemia in a patient with known history of hereditary hemorrhagic telangiectasia - transfuse 2 units of packed red blood cells. Check stool for occult blood. At this time back if patient on clear liquid diet. Protonix. If there is no significant improvement in hemoglobin after transfusion of stool for occult blood is positive then may consult GI. 2. Diabetes mellitus type 2 uncontrolled - check hemoglobin A1c and closely follow CBGs with sliding-scale coverage. Continue home medications. 3. Hypertension - continue home medications.    Code Status: Full code.  Family Communication: None.  Disposition Plan: Admit to  inpatient.    Gualberto Wahlen N. [facility] Pager [phone].  If 7PM-7AM, please contact night-coverage [email or web address] Password [email or web address] 11/08/2012, 12:11 AM

## 2012-11-08 NOTE — Op Note (Signed)
[facility] [place], [address]   OPERATIVE PROCEDURE REPORT  PATIENT: [name], [name]  MR#: [id number] BIRTHDATE: 1940/08/23  GENDER: Female ENDOSCOPIST: Carol Ada, MD ASSISTANT:   Cherylynn Ridges, technician and Lalla Brothers, RN PROCEDURE DATE: 11/08/2012 PROCEDURE:   EGD w/ ablation ASA CLASS:   Class III INDICATIONS: MEDICATIONS: Versed 8 mg IV and Fentanyl 75 mcg IV TOPICAL ANESTHETIC:   none  DESCRIPTION OF PROCEDURE:   After the risks benefits and alternatives of the procedure were thoroughly explained, informed consent was obtained.  The endoscope  endoscope was introduced through the mouth  and advanced to the third portion of the duodenum Without limitations.      The instrument was slowly withdrawn as the mucosa was fully examined.      FINDINGS: Multiple nonbleeding AVMs were found in the gastric lumen. A number of large AVMs were noted in the cardia and dist distal to the GE junction.  These AVMs were difficult to ablate, but ultimately the lesions were treated.  In the second and thrid portions of the duodenum several small nonbleeding AVMs were ablated.  One of the AVMs bled with ablation, but the bleeding was arrested with multiple applications of APC  No other abnormalities identified.          The scope was then withdrawn from the patient and the procedure terminated.  COMPLICATIONS: There were no complications. 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  RECOMMENDATIONS: 1) Follow HGB. 2) Transfuse as necessary.  _______________________________ eSignedCarol Ada, MD 11/08/2012 3:52 PM

## 2012-11-08 NOTE — H&P (View-Only) (Signed)
Patient seen and examined today, agree with H&P. Patient did not pay attention to see if she has blood in her stool at home or tarry stools. FOBT positive. GI consulted, EGD later today. Responded well to transfusion.   Costin M. Cruzita Lederer, MD [facility] [phone]

## 2012-11-08 NOTE — Care Management Note (Addendum)
    Page 1 of 1   11/10/2012     12:51:17 PM   CARE MANAGEMENT NOTE 11/10/2012  Patient:  [name], [name]   Account Number:  [id number]  Date Initiated:  11/08/2012  Documentation initiated by:  [facility]  Subjective/Objective Assessment:   72 Y/O F ADMITTED W/ANEMIA.HGB [id number] HEMORRHAGIC TELANGIECTASIA.     Action/Plan:   FROM HOME W/SPOUSE.HAS PCP,PHARMACY.   Anticipated DC Date:  11/10/2012   Anticipated DC Plan:  [place]  CM consult      Choice offered to / List presented to:             Status of service:  Completed, signed off Medicare Important Message given?   (If response is "NO", the following Medicare IM given date fields will be blank) Date Medicare IM given:   Date Additional Medicare IM given:    Discharge Disposition:  HOME/SELF CARE  Per UR Regulation:  Reviewed for med. necessity/level of care/duration of stay  If discussed at [place] of Stay Meetings, dates discussed:    Comments:  11/10/12 Hubbert Landrigan RN,BSN NCM [phone] D/C HOME NO [place].  11/09/12 Genasis Zingale RN,BSN NCM [phone] POD#1 EGD-GASTRIC PROXIMALW/ABLATION SMALL BOWEL AVM'S.GI FOLLOWING.NO ANTICIPATED D/C NEEDS.  11/08/12 Camil Hausmann RN,BSN NCM [phone]

## 2012-11-08 NOTE — Consult Note (Signed)
Reason for Consult: Anemia, Heme positive stool, and HHT Referring Physician: [facility]  [name] HPI: This is a 72 year old female with a history of HHT and bleeding gastric AVMs in 2013 who is admitted for anemia.  She was being followed and treated for her hypergylcemia and during evaluation her HGB was noted to be in the 5 range.  She saw some dark stools this AM, but she does not pay attention to her bowels.  No complaints of any chest pain or SOB.  The patient previously saw Dr. [name] Plenty and she had a colonoscopy 3 years ago, however, she did not pay her balance.  Subsequently she was discharged from their practice.  Past Medical History  Diagnosis Date  . Anemia   . S/P endoscopy     with laser treatment  08/19/2006  . History of epistaxis   . Hypertension   . Hyperlipidemia   . Telangiectasia     Gastric   . Lichen planus     Both lower extremities  . GERD (gastroesophageal reflux disease)   . Heart murmur   . HTN (hypertension), benign 03/02/2012  . Family history of anesthesia complication     "niece has a hard time coming out" (09/15/2012)  . CHF (congestive heart failure)   . Pneumonia 1990's    "twice" (09/15/2012)  . Type II diabetes mellitus     insulin requiring.  Marland Kitchen History of blood transfusion     "getting 2 today; have had transfusions before" (09/15/2012)  . Migraines     "healed of them over 40 yr ago" (09/15/2012)    Past Surgical History  Procedure Laterality Date  . Nose surgery      "for bleeding" (09/15/2012)  . Savory dilation  02/26/2011    Procedure: SAVORY DILATION;  Surgeon: [name], MD;  Location: [name];  Service: Endoscopy;  Laterality: N/A;  c-arm needed  . Esophagogastroduodenoscopy  02/26/2011    Procedure: ESOPHAGOGASTRODUODENOSCOPY (EGD);  Surgeon: [name], MD;  Location: [name];  Service: Endoscopy;  Laterality: N/A;    Family History  Problem Relation Age of Onset  . Breast cancer    . Malignant hyperthermia  Neg Hx   . Stroke Father     Social History:  reports that she has quit smoking. Her smoking use included Cigarettes. She smoked 0.00 packs per day for 2 years. She has never used smokeless tobacco. She reports that she does not drink alcohol or use illicit drugs.  Allergies:  Allergies  Allergen Reactions  . Aspirin Nausea And Vomiting    Medications:  Scheduled: . amitriptyline  25 mg Oral QHS  . insulin aspart  0-9 Units Subcutaneous TID WC  . pantoprazole (PROTONIX) IV  40 mg Intravenous Q12H  . sodium chloride  3 mL Intravenous Q12H  . vitamin B-12  1,000 mcg Oral QHS   Continuous: . sodium chloride 20 mL/hr at 11/08/12 0409    Results for orders placed during the hospital encounter of 11/07/12 (from the past 24 hour(s))  GLUCOSE, CAPILLARY     Status: Abnormal   Collection Time    11/07/12  8:40 PM      Result Value Range   Glucose-Capillary 334 (*) 70 - 99 mg/dL   Comment 1 Documented in Chart     Comment 2 Notify RN    CBC WITH DIFFERENTIAL     Status: Abnormal   Collection Time    11/07/12  9:21 PM  Result Value Range   WBC 5.3  4.0 - 10.5 K/uL   RBC 2.27 (*) 3.87 - 5.11 MIL/uL   Hemoglobin 5.7 (*) 12.0 - 15.0 g/dL   HCT 18.2 (*) 36.0 - 46.0 %   MCV 80.2  78.0 - 100.0 fL   MCH 25.1 (*) 26.0 - 34.0 pg   MCHC 31.3  30.0 - 36.0 g/dL   RDW 19.5 (*) 11.5 - 15.5 %   Platelets 215  150 - 400 K/uL   Neutrophils Relative % 68  43 - 77 %   Neutro Abs 3.6  1.7 - 7.7 K/uL   Lymphocytes Relative 19  12 - 46 %   Lymphs Abs 1.0  0.7 - 4.0 K/uL   Monocytes Relative 8  3 - 12 %   Monocytes Absolute 0.4  0.1 - 1.0 K/uL   Eosinophils Relative 5  0 - 5 %   Eosinophils Absolute 0.3  0.0 - 0.7 K/uL   Basophils Relative 0  0 - 1 %   Basophils Absolute 0.0  0.0 - 0.1 K/uL  COMPREHENSIVE METABOLIC PANEL     Status: Abnormal   Collection Time    11/07/12  9:21 PM      Result Value Range   Sodium 132 (*) 135 - 145 mEq/L   Potassium 3.8  3.5 - 5.1 mEq/L   Chloride 98  96  - 112 mEq/L   CO2 23  19 - 32 mEq/L   Glucose, Bld 324 (*) 70 - 99 mg/dL   BUN 19  6 - 23 mg/dL   Creatinine, Ser 1.09  0.50 - 1.10 mg/dL   Calcium 8.9  8.4 - 10.5 mg/dL   Total Protein 6.2  6.0 - 8.3 g/dL   Albumin 3.1 (*) 3.5 - 5.2 g/dL   AST 17  0 - 37 U/L   ALT 15  0 - 35 U/L   Alkaline Phosphatase 82  39 - 117 U/L   Total Bilirubin 0.3  0.3 - 1.2 mg/dL   GFR calc non Af Amer 49 (*) >90 mL/min   GFR calc Af Amer 57 (*) >90 mL/min  TYPE AND SCREEN     Status: None   Collection Time    11/07/12  9:32 PM      Result Value Range   ABO/RH(D) A POS     Antibody Screen NEG     Sample Expiration 11/10/2012     Unit Number [id number]     Blood Component Type RED CELLS,LR     Unit division 00     Status of Unit ISSUED     Transfusion Status OK TO TRANSFUSE     Crossmatch Result Compatible     Unit Number [id number]     Blood Component Type RED CELLS,LR     Unit division 00     Status of Unit ISSUED,FINAL     Transfusion Status OK TO TRANSFUSE     Crossmatch Result Compatible    PREPARE RBC (CROSSMATCH)     Status: None   Collection Time    11/07/12 10:30 PM      Result Value Range   Order Confirmation ORDER PROCESSED BY BLOOD BANK    GLUCOSE, CAPILLARY     Status: Abnormal   Collection Time    11/08/12 12:58 AM      Result Value Range   Glucose-Capillary 223 (*) 70 - 99 mg/dL  BASIC METABOLIC PANEL     Status: Abnormal   Collection Time  11/08/12  6:45 AM      Result Value Range   Sodium 137  135 - 145 mEq/L   Potassium 4.3  3.5 - 5.1 mEq/L   Chloride 105  96 - 112 mEq/L   CO2 23  19 - 32 mEq/L   Glucose, Bld 290 (*) 70 - 99 mg/dL   BUN 16  6 - 23 mg/dL   Creatinine, Ser 0.97  0.50 - 1.10 mg/dL   Calcium 8.9  8.4 - 10.5 mg/dL   GFR calc non Af Amer 57 (*) >90 mL/min   GFR calc Af Amer 66 (*) >90 mL/min  CBC     Status: Abnormal   Collection Time    11/08/12  6:45 AM      Result Value Range   WBC 5.6  4.0 - 10.5 K/uL   RBC 2.88 (*) 3.87 - 5.11 MIL/uL    Hemoglobin 7.5 (*) 12.0 - 15.0 g/dL   HCT 23.1 (*) 36.0 - 46.0 %   MCV 80.2  78.0 - 100.0 fL   MCH 26.0  26.0 - 34.0 pg   MCHC 32.5  30.0 - 36.0 g/dL   RDW 16.7 (*) 11.5 - 15.5 %   Platelets 187  150 - 400 K/uL  OCCULT BLOOD X 1 CARD TO LAB, STOOL     Status: Abnormal   Collection Time    11/08/12  9:33 AM      Result Value Range   Fecal Occult Bld POSITIVE (*) NEGATIVE     No results found.  ROS:  As stated above in the HPI otherwise negative.  Blood pressure 119/41, pulse 66, temperature 98.6 F (37 C), temperature source Oral, resp. rate 16, height 5\' 3"  (1.6 m), weight 154 lb 12.2 oz (70.2 kg), SpO2 99.00%.    PE: Gen: NAD, Alert and Oriented HEENT:  [place]/AT, EOMI Neck: Supple, no LAD Lungs: CTA Bilaterally CV: RRR without M/G/R ABM: Soft, NTND, +BS Ext: No C/C/E  Assessment/Plan: 1) Anemia. 2) HHT. 3) Heme positive stool.  Plan: 1) EGD with probable APC today.  Dent Plantz D 11/08/2012, 12:30 PM

## 2012-11-08 NOTE — Interval H&P Note (Signed)
History and Physical Interval Note:  11/08/2012 2:32 PM  [name]  has presented today for surgery, with the diagnosis of Anemia, Heme positive stool, and history of gastric AVMs  The various methods of treatment have been discussed with the patient and family. After consideration of risks, benefits and other options for treatment, the patient has consented to  Procedure(s): ESOPHAGOGASTRODUODENOSCOPY (EGD) (N/A) as a surgical intervention .  The patient's history has been reviewed, patient examined, no change in status, stable for surgery.  I have reviewed the patient's chart and labs.  Questions were answered to the patient's satisfaction.     [name] D

## 2012-11-08 NOTE — Progress Notes (Signed)
Patient seen and examined today, agree with H&P. Patient did not pay attention to see if she has blood in her stool at home or tarry stools. FOBT positive. GI consulted, EGD later today. Responded well to transfusion.   Costin M. Cruzita Lederer, MD [facility] [phone]

## 2012-11-09 ENCOUNTER — Encounter (HOSPITAL_COMMUNITY): Payer: Self-pay | Admitting: Gastroenterology

## 2012-11-09 DIAGNOSIS — R918 Other nonspecific abnormal finding of lung field: Secondary | ICD-10-CM

## 2012-11-09 DIAGNOSIS — D62 Acute posthemorrhagic anemia: Secondary | ICD-10-CM

## 2012-11-09 LAB — CBC
HCT: 24.6 % — ABNORMAL LOW (ref 36.0–46.0)
Hemoglobin: 7.8 g/dL — ABNORMAL LOW (ref 12.0–15.0)
MCH: 25.4 pg — ABNORMAL LOW (ref 26.0–34.0)
MCHC: 31.7 g/dL (ref 30.0–36.0)
RBC: 3.07 MIL/uL — ABNORMAL LOW (ref 3.87–5.11)
RDW: 17.1 % — ABNORMAL HIGH (ref 11.5–15.5)

## 2012-11-09 LAB — GLUCOSE, CAPILLARY
Glucose-Capillary: 352 mg/dL — ABNORMAL HIGH (ref 70–99)
Glucose-Capillary: 158 mg/dL — ABNORMAL HIGH (ref 70–99)
Glucose-Capillary: 279 mg/dL — ABNORMAL HIGH (ref 70–99)
Glucose-Capillary: 288 mg/dL — ABNORMAL HIGH (ref 70–99)
Glucose-Capillary: 237 mg/dL — ABNORMAL HIGH (ref 70–99)

## 2012-11-09 NOTE — Progress Notes (Signed)
Patient ID: [name], female   DOB: [birth year], 72 y.o.   MRN: [id number] [facility] PROGRESS NOTE  [name] [id number] DOB: 05-17-1940 DOA: 11/07/2012 PCP: Philis Fendt, MD  Brief narrative: 72 y.o. 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 She went to her PCP for regular followup 3 days prior to this admission. Her PCP call her day later and informed her to go to ED as her Hg was low ~5. Patient denied any rectal bleed or black stools.   Principal Problem:   Anemia, acute blood loss anemia - likely secondary to AVM - no significant change in Hg since yesterday, pt feels well this AM - monitor H/H and transfuse as indicated Active Problems:   Osler-Weber-Rendu disease - conservative management    DM (diabetes mellitus), type 2, uncontrolled - reasonable inpatient control   Consultants:  GI  Procedures/Studies: EGD 09/30 --> Dr. Benson Norway  FINDINGS:  Multiple nonbleeding AVMs were found in the gastric lumen. A number of large AVMs were noted in the cardia and dist distal to the GE junction. These AVMs were difficult to ablate, but ultimately the lesions were treated. In the second and thrid portions of the duodenum several small nonbleeding AVMs were ablated. One of the AVMs bled with ablation, but the bleeding was arrested with multiple applications of APC No other abnormalities identified. The scope was then withdrawn from the patient and the procedure terminated.  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   RECOMMENDATIONS:  1) Follow HGB.  2) Transfuse as necessary  Antibiotics:  None  Code Status: Full Family Communication: Pt at bedside Disposition Plan: Home when medically stable, possibly in AM  HPI/Subjective: No events overnight.   Objective: Filed Vitals:   11/08/12 1800 11/08/12 2113 11/09/12 0407 11/09/12 1451  BP: 121/44 139/56 131/42 120/36  Pulse:  64 65 66  Temp:  97.7 F (36.5 C) 99.2 F (37.3 C) 97.8 F (36.6 C)  TempSrc:  Oral Oral Oral  Resp: 16 18 19 22   Height:      Weight:      SpO2: 100% 98% 100% 100%    Intake/Output Summary (Last 24 hours) at 11/09/12 1528 Last data filed at 11/09/12 1300  Gross per 24 hour  Intake 1054.67 ml  Output   1600 ml  Net -545.33 ml    Exam:   General:  Pt is alert, follows commands appropriately, not in acute distress  Cardiovascular: Regular rate and rhythm, S1/S2, SEM 3/6, no rubs, no gallops  Respiratory: Clear to auscultation bilaterally, no wheezing, no crackles, no rhonchi  Abdomen: Soft, non tender, non distended, bowel sounds present, no guarding  Extremities: No edema, pulses DP and PT palpable bilaterally  Neuro: Grossly nonfocal  Data Reviewed: Basic Metabolic Panel:  Recent Labs Lab 11/07/12 2121 11/08/12 0645  NA 132* 137  K 3.8 4.3  CL 98 105  CO2 23 23  GLUCOSE 324* 290*  BUN 19 16  CREATININE 1.09 0.97  CALCIUM 8.9 8.9   Liver Function Tests:  Recent Labs Lab 11/07/12 2121  AST 17  ALT 15  ALKPHOS 82  BILITOT 0.3  PROT 6.2  ALBUMIN 3.1*   CBC:  Recent Labs Lab 11/07/12 2121 11/08/12 0645 11/08/12 1959 11/09/12 0510  WBC 5.3 5.6 6.7 8.0  NEUTROABS 3.6  --   --   --  HGB 5.7* 7.5* 7.7* 7.8*  HCT 18.2* 23.1* 24.4* 24.6*  MCV 80.2 80.2 81.3 80.1  PLT 215 187 189 178   CBG:  Recent Labs Lab 11/08/12 1644 11/08/12 1716 11/08/12 2101 11/09/12 0709 11/09/12 1157  GLUCAP 261* 274* 352* 158* 279*    No results found for this or any previous visit (from the past 240 hour(s)).   Scheduled Meds: . amitriptyline  25 mg Oral QHS  . insulin aspart  0-9 Units Subcutaneous TID WC  . pantoprazole (PROTONIX) IV  40 mg Intravenous Q12H  . sodium chloride  3 mL Intravenous Q12H  . vitamin B-12  1,000 mcg Oral QHS   Continuous  Infusions: . sodium chloride 20 mL/hr at 11/08/12 0409  . sodium chloride 20 mL/hr at 11/08/12 2049     Faye Ramsay, MD  [facility] Pager [phone]  If 7PM-7AM, please contact night-coverage [email or web address] Password [email or web address] 11/09/2012, 3:28 PM   LOS: 2 days

## 2012-11-09 NOTE — Progress Notes (Signed)
Inpatient Diabetes Program Recommendations  AACE/ADA: New Consensus Statement on Inpatient Glycemic Control (2013)  Target Ranges:  Prepandial:   less than 140 mg/dL      Peak postprandial:   less than 180 mg/dL (1-2 hours)      Critically ill patients:  140 - 180 mg/dL     Results for [name], [name] (MRN [id number]) as of 11/09/2012 09:59  Ref. Range 11/08/2012 07:54 11/08/2012 12:25 11/08/2012 16:44 11/08/2012 17:16 11/08/2012 21:01  Glucose-Capillary Latest Range: 70-99 mg/dL 292 (H) 220 (H) 261 (H) 274 (H) 352 (H)    Results for [name], [name] (MRN [id number]) as of 11/09/2012 09:59  Ref. Range 11/09/2012 07:09  Glucose-Capillary Latest Range: 70-99 mg/dL 158 (H)    **Noted per DM Coordinator note from yesterday, patient's husband states that patient takes Levemir 45 units QHS at home  **Note patient received Levemir 25 units last pm at bedtime.  AM CBG improved today.  Currently, patient does not have a standing Levemir dose.  **MD- Please place order for Levemir 25 units QHS (titrate as needed based on fasting glucose levels)   Will follow. Wyn Quaker RN, MSN, CDE Diabetes Coordinator Inpatient Diabetes Program Team Pager: [phone] (8a-10p)

## 2012-11-10 LAB — CBC
Hemoglobin: 7.6 g/dL — ABNORMAL LOW (ref 12.0–15.0)
MCH: 24.9 pg — ABNORMAL LOW (ref 26.0–34.0)
MCHC: 30.6 g/dL (ref 30.0–36.0)
Platelets: 188 10*3/uL (ref 150–400)
RBC: 3.05 MIL/uL — ABNORMAL LOW (ref 3.87–5.11)
RDW: 17.4 % — ABNORMAL HIGH (ref 11.5–15.5)
WBC: 6.7 10*3/uL (ref 4.0–10.5)

## 2012-11-10 LAB — PREPARE RBC (CROSSMATCH)

## 2012-11-10 LAB — BASIC METABOLIC PANEL
CO2: 27 mEq/L (ref 19–32)
Chloride: 105 mEq/L (ref 96–112)
GFR calc non Af Amer: 58 mL/min — ABNORMAL LOW (ref >90)
Glucose, Bld: 216 mg/dL — ABNORMAL HIGH (ref 70–99)
Potassium: 4 mEq/L (ref 3.5–5.1)
Sodium: 138 mEq/L (ref 135–145)

## 2012-11-10 LAB — GLUCOSE, CAPILLARY: Glucose-Capillary: 219 mg/dL — ABNORMAL HIGH (ref 70–99)

## 2012-11-10 NOTE — Discharge Summary (Signed)
Physician Discharge Summary  [name] [id number] DOB: 10-28-1940 DOA: 11/07/2012  PCP: Philis Fendt, MD  Admit date: 11/07/2012 Discharge date: 11/10/2012  Recommendations for Outpatient Follow-up:  1. Pt will need to follow up with PCP in 2-3 weeks post discharge 2. Please obtain BMP to evaluate electrolytes and kidney function 3. Please also check CBC to evaluate Hg and Hct levels 4. 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 She went to her PCP for regular followup 3 days prior to this admission. Her PCP call her day later and informed her to go to ED as her Hg was low ~5. Patient denied any rectal bleed or black stools.   Principal Problem:  Anemia, acute blood loss anemia  - Secondary to AV malformation, appreciate gastroenterology input and - Please note that hemoglobin remains low but overall stable at around 7.5, will transfuse one unit of blood today prior to discharge as patient wants to go home today - This patient is hemodynamically stable I agree with discharge provided that patient follows up closely with her primary care provider and has hemoglobin checked - Patient verbalizes understanding Active Problems:  Osler-Weber-Rendu disease  - conservative management and followup with  gastroenterologist DM (diabetes mellitus), type 2, uncontrolled  - reasonable inpatient control   Consultants:  GI Procedures/Studies:  EGD 09/30 --> Dr. Benson Norway  FINDINGS:  Multiple nonbleeding AVMs were found in the gastric lumen. A number of large AVMs were noted in the cardia and dist distal to the GE junction. These AVMs were difficult to ablate, but ultimately the lesions were treated. In the second and thrid portions of the duodenum several small nonbleeding AVMs were ablated. One of the AVMs bled with ablation, but the bleeding was arrested with multiple applications of APC No other abnormalities identified. The scope was then withdrawn from the patient and the procedure terminated.  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  RECOMMENDATIONS:  1) Follow HGB.  2) Transfuse as necessary  Antibiotics:  None  Code Status: Full  Family Communication: Pt at bedside   Discharge Exam: Filed Vitals:   11/10/12 0950  BP: 126/49  Pulse: 68  Temp: 98 F (36.7 C)  Resp: 20   Filed Vitals:   11/09/12 2135 11/10/12 0638 11/10/12 0925 11/10/12 0950  BP: 111/45 124/45 116/41 126/49  Pulse: 68 61 64 68  Temp: 99 F (37.2 C) 98.1 F (36.7 C) 98.3 F (36.8 C) 98 F (36.7 C)  TempSrc: Oral Oral Oral Oral  Resp: 18  22 20   Height:      Weight:      SpO2: 100% 98% 98% 99%    General: Pt is alert, follows commands appropriately, not in acute distress Cardiovascular: Regular rate and rhythm, S1/S2 +, no murmurs, no rubs, no gallops Respiratory: Clear to auscultation bilaterally, no wheezing, no crackles, no rhonchi Abdominal:  Soft, non tender, non distended, bowel sounds +, no guarding Extremities: no edema, no cyanosis, pulses palpable bilaterally DP and PT Neuro: Grossly nonfocal  Discharge Instructions  Discharge Orders   Future Appointments Provider Department Dept Phone   11/28/2012 1:15 PM [name] [phone]    11/28/2012 1:45 PM Owens Shark, NP [place] [phone]   Future Orders Complete By Expires   Diet - low sodium heart healthy  As directed    Increase activity slowly  As directed        Medication List         amitriptyline 25 MG tablet  Commonly known as:  ELAVIL  Take 25 mg by mouth At bedtime as needed for sleep.     enalapril 20 MG tablet  Commonly known as:  VASOTEC  Take 20 mg by mouth daily.     insulin aspart 100 UNIT/ML injection  Commonly known as:  novoLOG  Inject 3-12 Units into the skin 3 (three) times daily with meals. Sliding Scale     LEVEMIR FLEXPEN 100 UNIT/ML Sopn  Generic drug:  Insulin Detemir  Inject 45 Units into the skin at bedtime.     pantoprazole 40 MG tablet  Commonly known as:  PROTONIX  Take 40 mg by mouth daily as needed. For heartburn     sodium chloride 0.65 % nasal spray  Commonly known as:  OCEAN  Place 1 spray into the nose as needed. For nasal congestion     vitamin B-12 1000 MCG tablet  Commonly known as:  CYANOCOBALAMIN  Take 1,000 mcg by mouth at bedtime.     Vitamin D (Ergocalciferol) 50000 UNITS Caps capsule  Commonly known as:  DRISDOL  Take 50,000 Units by mouth every 7 (seven) days. Takes on Monday           Follow-up Information   Follow up with AVBUERE,EDWIN A, MD In 1 week. (you need complete blood cell count check )    Specialty:  Internal Medicine   Contact information:   [place] [address] [phone]       Follow up with Faye Ramsay, MD. (call my cell with any questions )    Specialty:  Internal Medicine   Contact information:   [address] [place] [address] [phone]        The results of significant diagnostics from this hospitalization (including imaging, microbiology, ancillary and laboratory) are listed below for reference.     Microbiology: No results found for this or any previous visit (from the past 240  hour(s)).   Labs: Basic Metabolic Panel:  Recent Labs Lab 11/07/12 2121 11/08/12 0645 11/10/12 0448  NA 132* 137 138  K 3.8 4.3 4.0  CL 98 105 105  CO2 23 23 27   GLUCOSE 324* 290* 216*  BUN 19 16 11   CREATININE 1.09 0.97 0.95  CALCIUM 8.9 8.9 8.8   Liver Function Tests:  Recent Labs Lab 11/07/12 2121  AST 17  ALT 15  ALKPHOS 82  BILITOT 0.3  PROT 6.2  ALBUMIN 3.1*   CBC:  Recent Labs Lab 11/07/12 2121 11/08/12 0645 11/08/12 1959 11/09/12 0510 11/10/12 0448  WBC 5.3 5.6 6.7 8.0 6.7  NEUTROABS 3.6  --   --   --   --   HGB 5.7* 7.5* 7.7* 7.8* 7.6*  HCT 18.2* 23.1* 24.4* 24.6* 24.8*  MCV 80.2 80.2 81.3 80.1 81.3  PLT 215 187 189 178 188   BNP (last 3 results)  Recent Labs  09/15/12 1706  PROBNP 139.3*   CBG:  Recent Labs Lab 11/09/12 0709 11/09/12 1157 11/09/12 1706 11/09/12 2217 11/10/12 0753  GLUCAP 158* 279* 288* 237* 219*     SIGNED: Time coordinating discharge: Over 30 minutes  Faye Ramsay, MD  [facility] 11/10/2012, 10:22 AM Pager [phone]  If 7PM-7AM, please contact night-coverage [email or web address] Password [email or web address]

## 2012-11-10 NOTE — Progress Notes (Signed)
Inpatient Diabetes Program Recommendations  AACE/ADA: New Consensus Statement on Inpatient Glycemic Control (2013)  Target Ranges:  Prepandial:   less than 140 mg/dL      Peak postprandial:   less than 180 mg/dL (1-2 hours)      Critically ill patients:  140 - 180 mg/dL     Results for MAAYA, BAGOT (MRN [id number]) as of 11/10/2012 09:47  Ref. Range 11/09/2012 07:09 11/09/2012 11:57 11/09/2012 17:06 11/09/2012 22:17  Glucose-Capillary Latest Range: 70-99 mg/dL 158 (H) 279 (H) 288 (H) 237 (H)    Results for ANARELY, SARCHET (MRN [id number]) as of 11/10/2012 09:47  Ref. Range 11/10/2012 07:53  Glucose-Capillary Latest Range: 70-99 mg/dL 219 (H)     **Noted per DM Coordinator note from 09/30, patient's husband states that patient takes Levemir 45 units QHS at home   **Note patient received Levemir 25 units on 09/30 at bedtime. AM CBG was improved the following day.  Currently, patient does not have an order for basal insulin.   **MD- Please order for Levemir 25 units QHS (titrate as needed based on fasting glucose levels)    Will follow. Wyn Quaker RN, MSN, CDE Diabetes Coordinator Inpatient Diabetes Program Team Pager: [phone] (8a-10p)

## 2012-11-11 LAB — TYPE AND SCREEN
ABO/RH(D): A POS
Antibody Screen: NEGATIVE
Unit division: 0
Unit division: 0
Unit division: 0

## 2012-11-28 ENCOUNTER — Other Ambulatory Visit (HOSPITAL_BASED_OUTPATIENT_CLINIC_OR_DEPARTMENT_OTHER): Payer: Medicare Other | Admitting: Lab

## 2012-11-28 ENCOUNTER — Ambulatory Visit (HOSPITAL_BASED_OUTPATIENT_CLINIC_OR_DEPARTMENT_OTHER): Payer: Medicare Other | Admitting: Nurse Practitioner

## 2012-11-28 ENCOUNTER — Ambulatory Visit: Payer: Medicare Other | Admitting: Oncology

## 2012-11-28 ENCOUNTER — Telehealth: Payer: Self-pay | Admitting: Oncology

## 2012-11-28 VITALS — BP 158/55 | HR 69 | Temp 97.3°F | Resp 20 | Ht 63.0 in | Wt 154.7 lb

## 2012-11-28 DIAGNOSIS — K922 Gastrointestinal hemorrhage, unspecified: Secondary | ICD-10-CM

## 2012-11-28 DIAGNOSIS — I780 Hereditary hemorrhagic telangiectasia: Secondary | ICD-10-CM

## 2012-11-28 DIAGNOSIS — D509 Iron deficiency anemia, unspecified: Secondary | ICD-10-CM

## 2012-11-28 DIAGNOSIS — K31819 Angiodysplasia of stomach and duodenum without bleeding: Secondary | ICD-10-CM

## 2012-11-28 DIAGNOSIS — L439 Lichen planus, unspecified: Secondary | ICD-10-CM

## 2012-11-28 DIAGNOSIS — I10 Essential (primary) hypertension: Secondary | ICD-10-CM

## 2012-11-28 DIAGNOSIS — R58 Hemorrhage, not elsewhere classified: Secondary | ICD-10-CM

## 2012-11-28 LAB — CBC WITH DIFFERENTIAL/PLATELET
BASO%: 0.4 % (ref 0.0–2.0)
Basophils Absolute: 0 10*3/uL (ref 0.0–0.1)
Eosinophils Absolute: 0.3 10*3/uL (ref 0.0–0.5)
HCT: 27 % — ABNORMAL LOW (ref 34.8–46.6)
HGB: 8.6 g/dL — ABNORMAL LOW (ref 11.6–15.9)
LYMPH%: 15.2 % (ref 14.0–49.7)
MCV: 75.6 fL — ABNORMAL LOW (ref 79.5–101.0)
MONO#: 0.5 10*3/uL (ref 0.1–0.9)
MONO%: 8.3 % (ref 0.0–14.0)
NEUT#: 4.6 10*3/uL (ref 1.5–6.5)
NEUT%: 71 % (ref 38.4–76.8)
Platelets: 228 10*3/uL (ref 145–400)
RBC: 3.57 10*6/uL — ABNORMAL LOW (ref 3.70–5.45)
WBC: 6.4 10*3/uL (ref 3.9–10.3)

## 2012-11-28 NOTE — Progress Notes (Signed)
OFFICE PROGRESS NOTE  Interval history:  [name] is a 72 year old woman with age HHT. She requires intermittent red cell transfusion support and iron infusions.  Most recently she was hospitalized 11/07/2012 through 11/10/2012 presenting with "weakness". Hemoglobin returned at 5.7. She underwent an upper endoscopy with findings of gastric and proximal small bowel AVMs some of which were ablated.  She is seen today for scheduled followup.  She overall is feeling well. She has occasional nosebleeds. She denies hematochezia or melena. No shortness of breath or chest pain. Energy level is good at present. She denies diarrhea. No mouth sores.   Objective: Blood pressure 158/55, pulse 69, temperature 97.3 F (36.3 C), temperature source Oral, resp. rate 20, height 5\' 3"  (1.6 m), weight 154 lb 11.2 oz (70.171 kg).  Oropharynx is without thrush or ulceration. Lungs are clear. No wheezes or rales. Regular cardiac rhythm. Abdomen is soft and nontender. No organomegaly. Extremities without edema. Calves nontender.  Lab Results: Lab Results  Component Value Date   WBC 6.4 11/28/2012   HGB 8.6* 11/28/2012   HCT 27.0* 11/28/2012   MCV 75.6* 11/28/2012   PLT 228 11/28/2012    Chemistry:    Chemistry      Component Value Date/Time   NA 138 11/10/2012 0448   NA 142 12/28/2011 0825   K 4.0 11/10/2012 0448   K 3.9 12/28/2011 0825   CL 105 11/10/2012 0448   CL 111* 12/28/2011 0825   CO2 27 11/10/2012 0448   CO2 26 12/28/2011 0825   BUN 11 11/10/2012 0448   BUN 18.0 12/28/2011 0825   CREATININE 0.95 11/10/2012 0448   CREATININE 0.7 12/28/2011 0825      Component Value Date/Time   CALCIUM 8.8 11/10/2012 0448   CALCIUM 9.6 12/28/2011 0825   ALKPHOS 82 11/07/2012 2121   ALKPHOS 93 12/28/2011 0825   AST 17 11/07/2012 2121   AST 42* 12/28/2011 0825   ALT 15 11/07/2012 2121   ALT 37 12/28/2011 0825   BILITOT 0.3 11/07/2012 2121   BILITOT 0.37 12/28/2011 0825       Studies/Results: No  results found.  Medications: I have reviewed the patient's current medications.  Assessment/Plan:  1. HHT. 2. 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 3. Hypertension. 4. Insulin-dependent diabetes. 5. Lichen planus.  Disposition-she appears stable. We will check labs on a monthly schedule to include CBC and iron studies with red cell transfusion support and IV iron infusions as needed. She will continue oral iron. We will see her in followup in 6 months. She knows to contact the office in the interim with any problems.  Plan reviewed with Dr. [name].  Ned Card ANP/GNP-BC

## 2012-11-28 NOTE — Telephone Encounter (Signed)
Gave pt appt for lab every month and see MD on April 2015

## 2012-11-30 MED ORDER — ONDANSETRON 8 MG/NS 50 ML IVPB
INTRAVENOUS | Status: AC
  Filled 2012-11-30: qty 8

## 2012-11-30 MED ORDER — DEXAMETHASONE SODIUM PHOSPHATE 10 MG/ML IJ SOLN
INTRAMUSCULAR | Status: AC
  Filled 2012-11-30: qty 1

## 2012-12-15 ENCOUNTER — Encounter (HOSPITAL_COMMUNITY): Payer: Self-pay | Admitting: Emergency Medicine

## 2012-12-15 ENCOUNTER — Emergency Department (HOSPITAL_COMMUNITY)
Admission: EM | Admit: 2012-12-15 | Discharge: 2012-12-15 | Disposition: A | Discharge status: Home / Self Care | Payer: Medicare Other | Attending: Emergency Medicine | Admitting: Emergency Medicine

## 2012-12-15 DIAGNOSIS — R350 Frequency of micturition: Secondary | ICD-10-CM | POA: Insufficient documentation

## 2012-12-15 DIAGNOSIS — R011 Cardiac murmur, unspecified: Secondary | ICD-10-CM | POA: Insufficient documentation

## 2012-12-15 DIAGNOSIS — Z862 Personal history of diseases of the blood and blood-forming organs and certain disorders involving the immune mechanism: Secondary | ICD-10-CM | POA: Insufficient documentation

## 2012-12-15 DIAGNOSIS — R109 Unspecified abdominal pain: Secondary | ICD-10-CM | POA: Insufficient documentation

## 2012-12-15 DIAGNOSIS — Z872 Personal history of diseases of the skin and subcutaneous tissue: Secondary | ICD-10-CM | POA: Insufficient documentation

## 2012-12-15 DIAGNOSIS — I509 Heart failure, unspecified: Secondary | ICD-10-CM | POA: Insufficient documentation

## 2012-12-15 DIAGNOSIS — I780 Hereditary hemorrhagic telangiectasia: Secondary | ICD-10-CM | POA: Insufficient documentation

## 2012-12-15 DIAGNOSIS — Z8639 Personal history of other endocrine, nutritional and metabolic disease: Secondary | ICD-10-CM | POA: Insufficient documentation

## 2012-12-15 DIAGNOSIS — R739 Hyperglycemia, unspecified: Secondary | ICD-10-CM

## 2012-12-15 DIAGNOSIS — Z79899 Other long term (current) drug therapy: Secondary | ICD-10-CM | POA: Insufficient documentation

## 2012-12-15 DIAGNOSIS — E119 Type 2 diabetes mellitus without complications: Secondary | ICD-10-CM | POA: Insufficient documentation

## 2012-12-15 DIAGNOSIS — K219 Gastro-esophageal reflux disease without esophagitis: Secondary | ICD-10-CM | POA: Insufficient documentation

## 2012-12-15 DIAGNOSIS — Z87891 Personal history of nicotine dependence: Secondary | ICD-10-CM | POA: Insufficient documentation

## 2012-12-15 DIAGNOSIS — Z8701 Personal history of pneumonia (recurrent): Secondary | ICD-10-CM | POA: Insufficient documentation

## 2012-12-15 DIAGNOSIS — D649 Anemia, unspecified: Secondary | ICD-10-CM | POA: Insufficient documentation

## 2012-12-15 DIAGNOSIS — I10 Essential (primary) hypertension: Secondary | ICD-10-CM | POA: Insufficient documentation

## 2012-12-15 DIAGNOSIS — K625 Hemorrhage of anus and rectum: Secondary | ICD-10-CM | POA: Insufficient documentation

## 2012-12-15 DIAGNOSIS — Z794 Long term (current) use of insulin: Secondary | ICD-10-CM | POA: Insufficient documentation

## 2012-12-15 LAB — CBC WITH DIFFERENTIAL/PLATELET
Basophils Absolute: 0 10*3/uL (ref 0.0–0.1)
Basophils Relative: 0 % (ref 0–1)
Eosinophils Absolute: 0.3 10*3/uL (ref 0.0–0.7)
HCT: 22 % — ABNORMAL LOW (ref 36.0–46.0)
Hemoglobin: 6.6 g/dL — CL (ref 12.0–15.0)
Lymphs Abs: 0.8 10*3/uL (ref 0.7–4.0)
MCHC: 30 g/dL (ref 30.0–36.0)
MCV: 75.6 fL — ABNORMAL LOW (ref 78.0–100.0)
Neutro Abs: 4.6 10*3/uL (ref 1.7–7.7)
RDW: 17.4 % — ABNORMAL HIGH (ref 11.5–15.5)

## 2012-12-15 LAB — BASIC METABOLIC PANEL
BUN: 20 mg/dL (ref 6–23)
CO2: 25 mEq/L (ref 19–32)
Chloride: 104 mEq/L (ref 96–112)
Creatinine, Ser: 0.88 mg/dL (ref 0.50–1.10)
GFR calc Af Amer: 74 mL/min — ABNORMAL LOW (ref >90)
Glucose, Bld: 174 mg/dL — ABNORMAL HIGH (ref 70–99)
Sodium: 138 mEq/L (ref 135–145)

## 2012-12-15 LAB — URINALYSIS, ROUTINE W REFLEX MICROSCOPIC
Bilirubin Urine: NEGATIVE
Hgb urine dipstick: NEGATIVE
Ketones, ur: NEGATIVE mg/dL
Nitrite: NEGATIVE
pH: 6.5 (ref 5.0–8.0)

## 2012-12-15 LAB — URINE MICROSCOPIC-ADD ON

## 2012-12-15 LAB — GLUCOSE, CAPILLARY: Glucose-Capillary: 232 mg/dL — ABNORMAL HIGH (ref 70–99)

## 2012-12-15 MED ORDER — SODIUM CHLORIDE 0.9 % IV BOLUS (SEPSIS)
500.0000 mL | Freq: Once | INTRAVENOUS | Status: AC
  Administered 2012-12-15: 500 mL via INTRAVENOUS

## 2012-12-15 NOTE — ED Notes (Signed)
Critical lab value Hgb 6.6

## 2012-12-15 NOTE — ED Notes (Signed)
Patient reports that her blood sugar has been as high 445 today. Patient states she has been compliant with prescribed medication. Patient c/o "feeling gripey" in her stomach. Patient denies N/V/D.

## 2012-12-15 NOTE — ED Notes (Signed)
POC CBG 232

## 2012-12-15 NOTE — ED Notes (Signed)
MD at bedside. 

## 2012-12-15 NOTE — ED Provider Notes (Signed)
CSN: [id number]     Arrival date & time 12/15/12  1619 History   First MD Initiated Contact with Patient 12/15/12 1646     Chief Complaint  Patient presents with  . Hyperglycemia   (Consider location/radiation/quality/duration/timing/severity/associated sxs/prior Treatment) HPI Comments: [name] is a 72 y.o. female who presents for evaluation of hyperglycemia. It has been present on her home monitoring for 3 days. She has associated abdominal pain, which is mild, and urinary frequency. There is no nausea, vomiting, dysuria, hematuria, diarrhea, or weakness. She is using her usual medications. Her insulin was changed from Lantus to Levemir with NovoLog sliding scale supplementation, one month ago. There are no other known modifying factors.   Patient is a 72 y.o. female presenting with hyperglycemia. The history is provided by the patient.  Hyperglycemia   Past Medical History  Diagnosis Date  . Anemia   . S/P endoscopy     with laser treatment  08/19/2006  . History of epistaxis   . Hypertension   . Hyperlipidemia   . Telangiectasia     Gastric   . Lichen planus     Both lower extremities  . GERD (gastroesophageal reflux disease)   . Heart murmur   . HTN (hypertension), benign 03/02/2012  . Family history of anesthesia complication     "niece has a hard time coming out" (09/15/2012)  . CHF (congestive heart failure)   . Pneumonia 1990's    "twice" (09/15/2012)  . Type II diabetes mellitus     insulin requiring.  Marland Kitchen History of blood transfusion     "getting 2 today; have had transfusions before" (09/15/2012)   Past Surgical History  Procedure Laterality Date  . Nose surgery      "for bleeding" (09/15/2012)  . Savory dilation  02/26/2011    Procedure: SAVORY DILATION;  Surgeon: [name], MD;  Location: [name];  Service: Endoscopy;  Laterality: N/A;  c-arm needed  . Esophagogastroduodenoscopy  02/26/2011    Procedure: ESOPHAGOGASTRODUODENOSCOPY (EGD);  Surgeon: [name], MD;  Location: [name];  Service: Endoscopy;  Laterality: N/A;  . Esophagogastroduodenoscopy N/A 11/08/2012    Procedure: ESOPHAGOGASTRODUODENOSCOPY (EGD);  Surgeon: [name], MD;  Location: [name];  Service: Endoscopy;  Laterality: N/A;   Family History  Problem Relation Age of Onset  . Breast cancer    . Malignant hyperthermia Neg Hx   . Stroke Father    History  Substance Use Topics  . Smoking status: Former Smoker -- 2 years    Types: Cigarettes  . Smokeless tobacco: Never Used     Comment: 09/15/2012 "smoked 50-60 yr ago"  . Alcohol Use: No   OB History   Grav Para Term Preterm Abortions TAB SAB Ect Mult Living                 Review of Systems  All other systems reviewed and are negative.    Allergies  Aspirin  Home Medications   Current Outpatient Rx  Name  Route  Sig  Dispense  Refill  . enalapril (VASOTEC) 20 MG tablet   Oral   Take 20 mg by mouth daily.         . Insulin Detemir (LEVEMIR FLEXPEN) 100 UNIT/ML SOPN   Subcutaneous   Inject 45 Units into the skin at bedtime.         . insulin lispro (HUMALOG KWIKPEN) 100 UNIT/ML SOPN   Subcutaneous   Inject 3-4 Units into the skin  once. Only if blood sugar is over 300         . metFORMIN (GLUCOPHAGE) 1000 MG tablet   Oral   Take 500 mg by mouth 2 (two) times daily with a meal.         . pantoprazole (PROTONIX) 40 MG tablet   Oral   Take 40 mg by mouth daily as needed. For heartburn         . sodium chloride (OCEAN) 0.65 % nasal spray   Nasal   Place 1 spray into the nose as needed. For nasal congestion         . vitamin B-12 (CYANOCOBALAMIN) 1000 MCG tablet   Oral   Take 1,000 mcg by mouth at bedtime.          . Vitamin D, Ergocalciferol, (DRISDOL) 50000 UNITS CAPS   Oral   Take 50,000 Units by mouth every 7 (seven) days. Takes on Monday          BP 136/41  Pulse 78  Temp(Src) 98.4 F (36.9 C) (Oral)  Resp 16  SpO2 100% Physical Exam  Nursing note  and vitals reviewed. Constitutional: She is oriented to person, place, and time. She appears well-developed and well-nourished.  HENT:  Head: Normocephalic and atraumatic.  Eyes: Conjunctivae and EOM are normal. Pupils are equal, round, and reactive to light.  Neck: Normal range of motion and phonation normal. Neck supple.  Cardiovascular: Normal rate, regular rhythm and intact distal pulses.   Pulmonary/Chest: Effort normal and breath sounds normal. She exhibits no tenderness.  Abdominal: Soft. Bowel sounds are normal. She exhibits no distension. There is no tenderness. There is no guarding.  Musculoskeletal: Normal range of motion.  Neurological: She is alert and oriented to person, place, and time. She exhibits normal muscle tone.  Skin: Skin is warm and dry.  Psychiatric: She has a normal mood and affect. Her behavior is normal. Judgment and thought content normal.    ED Course  Procedures (including critical care time) Medications  sodium chloride 0.9 % bolus 500 mL (0 mLs Intravenous Stopped 12/15/12 2139)   Patient Vitals for the past 24 hrs:  BP Temp Temp src Pulse Resp SpO2  12/15/12 1627 136/41 mmHg 98.4 F (36.9 C) Oral 78 16 100 %    CBG is mildly elevated, 232   9:08 PM Reevaluation with update and discussion. After initial assessment and treatment, an updated evaluation reveals she is comfortable, she ambulates easily, in no apparent distress. Findings discussed with patient and family member. All questions answered. Dontaye Hur L    Labs Review Labs Reviewed  CBC WITH DIFFERENTIAL - Abnormal; Notable for the following:    RBC 2.91 (*)    Hemoglobin 6.6 (*)    HCT 22.0 (*)    MCV 75.6 (*)    MCH 22.7 (*)    RDW 17.4 (*)    All other components within normal limits  BASIC METABOLIC PANEL - Abnormal; Notable for the following:    Glucose, Bld 174 (*)    GFR calc non Af Amer 64 (*)    GFR calc Af Amer 74 (*)    All other components within normal limits   URINALYSIS, ROUTINE W REFLEX MICROSCOPIC - Abnormal; Notable for the following:    Leukocytes, UA SMALL (*)    All other components within normal limits  GLUCOSE, CAPILLARY - Abnormal; Notable for the following:    Glucose-Capillary 232 (*)    All other components within normal limits  OCCULT BLOOD, POC DEVICE - Abnormal; Notable for the following:    Fecal Occult Bld POSITIVE (*)    All other components within normal limits  URINE CULTURE  URINE MICROSCOPIC-ADD ON   Imaging Review No results found.  EKG Interpretation   None       MDM   1. Hyperglycemia   2. Anemia   3. Rectal bleeding   4. Hereditary hemorrhagic telangiectasia      Hyperglycemia with abdominal pain, and essentially negative workup relative that. Glucose is mildly elevated. Anion gap is normal. No evidence for ketosis. She has mild renal insufficiency, and is at her baseline. She has anemia, which is recurrent. Her hemoglobin is down 2 g from her baseline 3 weeks ago. She gets periodic transfusions, ordered by her hematologist. She is currently asymptomatic for the anemia. Overall, she is stable for discharge and can receive outpatient blood transfusions.   Nursing Notes Reviewed/ Care Coordinated, and agree without changes. Applicable Imaging Reviewed.  Interpretation of Laboratory Data incorporated into ED treatment   Plan: Home Medications- usual; Home Treatments and Observation- rest; return here if the recommended treatment, does not improve the symptoms; Recommended follow up- call, hematologist in the morning to arrange outpatient blood transfusion       Richarda Blade, MD 12/15/12 2300

## 2012-12-17 LAB — URINE CULTURE: Colony Count: 30000

## 2012-12-18 ENCOUNTER — Telehealth (HOSPITAL_COMMUNITY): Payer: Self-pay | Admitting: *Deleted

## 2012-12-18 NOTE — ED Notes (Signed)
Post ED Visit - Positive Culture Follow-up: Successful Patient Follow-Up + Urine Culture [X]  Patient discharged originally without antimicrobial agent and treatment is now indicated  New antibiotic prescription: Cephalexin 250mg  po four times daily for 10 days  ED Provider: Baron Sane, PA-C  Sherlon Handing, PharmD, BCPS  Varney Baas 12/18/2012, 2:36 PM

## 2012-12-18 NOTE — Progress Notes (Signed)
ED Antimicrobial Stewardship Positive Culture Follow Up   [name] is an 72 y.o. female who presented to [facility] on 12/15/2012 with a chief complaint of  Chief Complaint  Patient presents with  . Hyperglycemia    Recent Results (from the past 720 hour(s))  URINE CULTURE     Status: None   Collection Time    12/15/12  7:07 PM      Result Value Range Status   Specimen Description URINE, CLEAN CATCH   Final   Special Requests NONE   Final   Culture  Setup Time     Final   Value: 12/16/2012 03:08     Performed at [place]     Final   Value: 30,000 COLONIES/ML     Performed at [facility]   Culture     Final   Value: ESCHERICHIA COLI     Performed at [facility]   Report Status 12/17/2012 FINAL   Final   Organism ID, Bacteria ESCHERICHIA COLI   Final     [x]  Patient discharged originally without antimicrobial agent and treatment is now indicated  New antibiotic prescription: Cephalexin 250mg  po four times daily for 10 days   ED Provider: Baron Sane, PA-C   Sherlon Handing, PharmD, BCPS Clinical pharmacist, pager [phone] 12/18/2012, 11:42 AM

## 2012-12-18 NOTE — ED Notes (Signed)
Patient informed of results and requests that rx be called to  [facility] @ [phone]-Called by Larene Beach PFM.

## 2012-12-22 ENCOUNTER — Other Ambulatory Visit: Payer: Self-pay | Admitting: Nurse Practitioner

## 2012-12-22 ENCOUNTER — Other Ambulatory Visit: Payer: Self-pay | Admitting: *Deleted

## 2012-12-22 ENCOUNTER — Ambulatory Visit (HOSPITAL_BASED_OUTPATIENT_CLINIC_OR_DEPARTMENT_OTHER): Payer: Medicare Other | Admitting: Lab

## 2012-12-22 ENCOUNTER — Ambulatory Visit (HOSPITAL_COMMUNITY)
Admission: RE | Admit: 2012-12-22 | Discharge: 2012-12-22 | Disposition: A | Discharge status: Home / Self Care | Payer: Medicare Other | Source: Ambulatory Visit | Attending: Oncology | Admitting: Oncology

## 2012-12-22 ENCOUNTER — Ambulatory Visit (HOSPITAL_BASED_OUTPATIENT_CLINIC_OR_DEPARTMENT_OTHER): Payer: Medicare Other

## 2012-12-22 VITALS — BP 117/43 | HR 63 | Temp 98.7°F | Resp 18

## 2012-12-22 DIAGNOSIS — D509 Iron deficiency anemia, unspecified: Secondary | ICD-10-CM

## 2012-12-22 DIAGNOSIS — I780 Hereditary hemorrhagic telangiectasia: Secondary | ICD-10-CM

## 2012-12-22 DIAGNOSIS — D649 Anemia, unspecified: Secondary | ICD-10-CM

## 2012-12-22 DIAGNOSIS — K31819 Angiodysplasia of stomach and duodenum without bleeding: Secondary | ICD-10-CM

## 2012-12-22 DIAGNOSIS — I10 Essential (primary) hypertension: Secondary | ICD-10-CM

## 2012-12-22 DIAGNOSIS — K922 Gastrointestinal hemorrhage, unspecified: Secondary | ICD-10-CM

## 2012-12-22 LAB — CBC WITH DIFFERENTIAL/PLATELET
Basophils Absolute: 0 10*3/uL (ref 0.0–0.1)
EOS%: 6.7 % (ref 0.0–7.0)
Eosinophils Absolute: 0.3 10*3/uL (ref 0.0–0.5)
HCT: 19.7 % — ABNORMAL LOW (ref 34.8–46.6)
HGB: 5.8 g/dL — CL (ref 11.6–15.9)
LYMPH%: 14 % (ref 14.0–49.7)
MCH: 21.2 pg — ABNORMAL LOW (ref 25.1–34.0)
MCV: 71.7 fL — ABNORMAL LOW (ref 79.5–101.0)
NEUT#: 3.5 10*3/uL (ref 1.5–6.5)
NEUT%: 69.3 % (ref 38.4–76.8)
Platelets: 211 10*3/uL (ref 145–400)
RDW: 19.6 % — ABNORMAL HIGH (ref 11.2–14.5)
lymph#: 0.7 10*3/uL — ABNORMAL LOW (ref 0.9–3.3)

## 2012-12-22 LAB — IRON AND TIBC CHCC
%SAT: 4 % — ABNORMAL LOW (ref 21–57)
Iron: 13 ug/dL — ABNORMAL LOW (ref 41–142)
TIBC: 330 ug/dL (ref 236–444)

## 2012-12-22 MED ORDER — SODIUM CHLORIDE 0.9 % IV SOLN
250.0000 mL | Freq: Once | INTRAVENOUS | Status: AC
  Administered 2012-12-22: 250 mL via INTRAVENOUS

## 2012-12-22 MED ORDER — SODIUM CHLORIDE 0.9 % IV SOLN
1020.0000 mg | Freq: Once | INTRAVENOUS | Status: AC
  Administered 2012-12-22: 1020 mg via INTRAVENOUS
  Filled 2012-12-22: qty 34

## 2012-12-22 MED ORDER — SODIUM CHLORIDE 0.9 % IV SOLN: Freq: Once | INTRAVENOUS | Status: DC

## 2012-12-22 NOTE — Patient Instructions (Signed)
Blood Transfusion Information WHAT IS A BLOOD TRANSFUSION? A transfusion is the replacement of blood or some of its parts. Blood is made up of multiple cells which provide different functions.  Red blood cells carry oxygen and are used for blood loss replacement.  White blood cells fight against infection.  Platelets control bleeding.  Plasma helps clot blood.  Other blood products are available for specialized needs, such as hemophilia or other clotting disorders. BEFORE THE TRANSFUSION  Who gives blood for transfusions?   You may be able to donate blood to be used at a later date on yourself (autologous donation).  Relatives can be asked to donate blood. This is generally not any safer than if you have received blood from a stranger. The same precautions are taken to ensure safety when a relative's blood is donated.  Healthy volunteers who are fully evaluated to make sure their blood is safe. This is blood bank blood. Transfusion therapy is the safest it has ever been in the practice of medicine. Before blood is taken from a donor, a complete history is taken to make sure that person has no history of diseases nor engages in risky social behavior (examples are intravenous drug use or sexual activity with multiple partners). The donor's travel history is screened to minimize risk of transmitting infections, such as malaria. The donated blood is tested for signs of infectious diseases, such as HIV and hepatitis. The blood is then tested to be sure it is compatible with you in order to minimize the chance of a transfusion reaction. If you or a relative donates blood, this is often done in anticipation of surgery and is not appropriate for emergency situations. It takes many days to process the donated blood. RISKS AND COMPLICATIONS Although transfusion therapy is very safe and saves many lives, the main dangers of transfusion include:   Getting an infectious disease.  Developing a  transfusion reaction. This is an allergic reaction to something in the blood you were given. Every precaution is taken to prevent this. The decision to have a blood transfusion has been considered carefully by your caregiver before blood is given. Blood is not given unless the benefits outweigh the risks. AFTER THE TRANSFUSION  Right after receiving a blood transfusion, you will usually feel much better and more energetic. This is especially true if your red blood cells have gotten low (anemic). The transfusion raises the level of the red blood cells which carry oxygen, and this usually causes an energy increase.  The nurse administering the transfusion will monitor you carefully for complications. HOME CARE INSTRUCTIONS  No special instructions are needed after a transfusion. You may find your energy is better. Speak with your caregiver about any limitations on activity for underlying diseases you may have. SEEK MEDICAL CARE IF:   Your condition is not improving after your transfusion.  You develop redness or irritation at the intravenous (IV) site. SEEK IMMEDIATE MEDICAL CARE IF:  Any of the following symptoms occur over the next 12 hours:  Shaking chills.  You have a temperature by mouth above 102 F (38.9 C), not controlled by medicine.  Chest, back, or muscle pain.  People around you feel you are not acting correctly or are confused.  Shortness of breath or difficulty breathing.  Dizziness and fainting.  You get a rash or develop hives.  You have a decrease in urine output.  Your urine turns a dark color or changes to pink, red, or brown. Any of the following   symptoms occur over the next 10 days:  You have a temperature by mouth above 102 F (38.9 C), not controlled by medicine.  Shortness of breath.  Weakness after normal activity.  The white part of the eye turns yellow (jaundice).  You have a decrease in the amount of urine or are urinating less often.  Your  urine turns a dark color or changes to pink, red, or brown. Document Released: 01/24/2000 Document Revised: 04/20/2011 Document Reviewed: 09/12/2007 [facility] Patient Information 2014 [place]. Ferumoxytol injection What is this medicine? FERUMOXYTOL is an iron complex. Iron is used to make healthy red blood cells, which carry oxygen and nutrients throughout the body. This medicine is used to treat iron deficiency anemia in people with chronic kidney disease. This medicine may be used for other purposes; ask your health care provider or pharmacist if you have questions. COMMON BRAND NAME(S): Feraheme  What should I tell my health care provider before I take this medicine? They need to know if you have any of these conditions: -anemia not caused by low iron levels -high levels of iron in the blood -magnetic resonance imaging (MRI) test scheduled -an unusual or allergic reaction to iron, other medicines, foods, dyes, or preservatives -pregnant or trying to get pregnant -breast-feeding How should I use this medicine? This medicine is for injection into a vein. It is given by a health care professional in a hospital or clinic setting. Talk to your pediatrician regarding the use of this medicine in children. Special care may be needed. Overdosage: If you think you've taken too much of this medicine contact a poison control center or emergency room at once. Overdosage: If you think you have taken too much of this medicine contact a poison control center or emergency room at once. NOTE: This medicine is only for you. Do not share this medicine with others. What if I miss a dose? It is important not to miss your dose. Call your doctor or health care professional if you are unable to keep an appointment. What may interact with this medicine? This medicine may interact with the following medications: -other iron products This list may not describe all possible interactions. Give your health  care provider a list of all the medicines, herbs, non-prescription drugs, or dietary supplements you use. Also tell them if you smoke, drink alcohol, or use illegal drugs. Some items may interact with your medicine. What should I watch for while using this medicine? Visit your doctor or healthcare professional regularly. Tell your doctor or healthcare professional if your symptoms do not start to get better or if they get worse. You may need blood work done while you are taking this medicine. You may need to follow a special diet. Talk to your doctor. Foods that contain iron include: whole grains/cereals, dried fruits, beans, or peas, leafy green vegetables, and organ meats (liver, kidney). What side effects may I notice from receiving this medicine? Side effects that you should report to your doctor or health care professional as soon as possible: -allergic reactions like skin rash, itching or hives, swelling of the face, lips, or tongue -breathing problems -changes in blood pressure -feeling faint or lightheaded, falls -fever or chills -flushing, sweating, or hot feelings -swelling of the ankles or feet Side effects that usually do not require medical attention (Report these to your doctor or health care professional if they continue or are bothersome.): -diarrhea -headache -nausea, vomiting -stomach pain This list may not describe all possible side effects. Call  your doctor for medical advice about side effects. You may report side effects to FDA at 1-800-FDA-1088. Where should I keep my medicine? This drug is given in a hospital or clinic and will not be stored at home. NOTE: This sheet is a summary. It may not cover all possible information. If you have questions about this medicine, talk to your doctor, pharmacist, or health care provider.  2014, Elsevier/[employer] Standard. (2011-09-11 15:23:36)

## 2012-12-23 LAB — TYPE AND SCREEN
ABO/RH(D): A POS
Antibody Screen: NEGATIVE
Unit division: 0
Unit division: 0

## 2012-12-29 ENCOUNTER — Other Ambulatory Visit (HOSPITAL_BASED_OUTPATIENT_CLINIC_OR_DEPARTMENT_OTHER): Payer: Medicare Other | Admitting: Lab

## 2012-12-29 DIAGNOSIS — D508 Other iron deficiency anemias: Secondary | ICD-10-CM

## 2012-12-29 DIAGNOSIS — D509 Iron deficiency anemia, unspecified: Secondary | ICD-10-CM

## 2012-12-29 DIAGNOSIS — K31819 Angiodysplasia of stomach and duodenum without bleeding: Secondary | ICD-10-CM

## 2012-12-29 DIAGNOSIS — K31811 Angiodysplasia of stomach and duodenum with bleeding: Secondary | ICD-10-CM

## 2012-12-29 DIAGNOSIS — K922 Gastrointestinal hemorrhage, unspecified: Secondary | ICD-10-CM

## 2012-12-29 DIAGNOSIS — K5521 Angiodysplasia of colon with hemorrhage: Secondary | ICD-10-CM

## 2012-12-29 DIAGNOSIS — I780 Hereditary hemorrhagic telangiectasia: Secondary | ICD-10-CM

## 2012-12-29 LAB — IRON AND TIBC CHCC
%SAT: 44 % (ref 21–57)
Iron: 120 ug/dL (ref 41–142)
TIBC: 275 ug/dL (ref 236–444)
UIBC: 155 ug/dL (ref 120–384)

## 2012-12-29 LAB — CBC WITH DIFFERENTIAL/PLATELET
BASO%: 0.5 % (ref 0.0–2.0)
Basophils Absolute: 0 10*3/uL (ref 0.0–0.1)
Eosinophils Absolute: 0.3 10*3/uL (ref 0.0–0.5)
HGB: 9.3 g/dL — ABNORMAL LOW (ref 11.6–15.9)
MCHC: 31.8 g/dL (ref 31.5–36.0)
MONO#: 0.5 10*3/uL (ref 0.1–0.9)
MONO%: 9.1 % (ref 0.0–14.0)
NEUT#: 4.6 10*3/uL (ref 1.5–6.5)
Platelets: 144 10*3/uL — ABNORMAL LOW (ref 145–400)
RBC: 3.59 10*6/uL — ABNORMAL LOW (ref 3.70–5.45)
RDW: 28 % — ABNORMAL HIGH (ref 11.2–14.5)
WBC: 6 10*3/uL (ref 3.9–10.3)
lymph#: 0.5 10*3/uL — ABNORMAL LOW (ref 0.9–3.3)

## 2012-12-29 LAB — FERRITIN CHCC: Ferritin: 400 ng/ml — ABNORMAL HIGH (ref 9–269)

## 2012-12-30 ENCOUNTER — Telehealth: Payer: Self-pay | Admitting: *Deleted

## 2012-12-30 NOTE — Telephone Encounter (Signed)
Pt notified of blood count per Dr Azucena Freed note & she reports having a nose bleed this am.  It has stopped now.  Encouraged to let her ENT know if this continues.

## 2012-12-30 NOTE — Telephone Encounter (Signed)
Message copied by Jesse Fall on Fri Dec 30, 2012 12:05 PM ------      Message from: [name]      Created: Thu Dec 29, 2012  4:11 PM       Call pt  Blood count up to 9.3 ------

## 2013-01-24 ENCOUNTER — Ambulatory Visit (HOSPITAL_BASED_OUTPATIENT_CLINIC_OR_DEPARTMENT_OTHER): Payer: Medicare Other

## 2013-01-24 ENCOUNTER — Telehealth: Payer: Self-pay | Admitting: Oncology

## 2013-01-24 DIAGNOSIS — I780 Hereditary hemorrhagic telangiectasia: Secondary | ICD-10-CM

## 2013-01-24 DIAGNOSIS — D509 Iron deficiency anemia, unspecified: Secondary | ICD-10-CM

## 2013-01-24 DIAGNOSIS — K31819 Angiodysplasia of stomach and duodenum without bleeding: Secondary | ICD-10-CM

## 2013-01-24 DIAGNOSIS — K922 Gastrointestinal hemorrhage, unspecified: Secondary | ICD-10-CM

## 2013-01-24 LAB — CBC WITH DIFFERENTIAL/PLATELET
BASO%: 0.8 % (ref 0.0–2.0)
Basophils Absolute: 0 10*3/uL (ref 0.0–0.1)
EOS%: 8 % — ABNORMAL HIGH (ref 0.0–7.0)
HGB: 10.3 g/dL — ABNORMAL LOW (ref 11.6–15.9)
MCH: 28.6 pg (ref 25.1–34.0)
MCHC: 33.4 g/dL (ref 31.5–36.0)
MCV: 85.7 fL (ref 79.5–101.0)
MONO#: 0.4 10*3/uL (ref 0.1–0.9)
MONO%: 7.4 % (ref 0.0–14.0)
NEUT#: 4 10*3/uL (ref 1.5–6.5)
NEUT%: 73.3 % (ref 38.4–76.8)
RBC: 3.6 10*6/uL — ABNORMAL LOW (ref 3.70–5.45)
RDW: 22.2 % — ABNORMAL HIGH (ref 11.2–14.5)
WBC: 5.5 10*3/uL (ref 3.9–10.3)
lymph#: 0.6 10*3/uL — ABNORMAL LOW (ref 0.9–3.3)

## 2013-01-24 LAB — IRON AND TIBC CHCC: Iron: 38 ug/dL — ABNORMAL LOW (ref 41–142)

## 2013-01-24 NOTE — Telephone Encounter (Signed)
pt came by today and wants labs drawn

## 2013-01-26 ENCOUNTER — Other Ambulatory Visit: Payer: Medicare Other

## 2013-02-14 ENCOUNTER — Other Ambulatory Visit: Payer: Self-pay

## 2013-02-14 DIAGNOSIS — Z1231 Encounter for screening mammogram for malignant neoplasm of breast: Secondary | ICD-10-CM

## 2013-02-23 ENCOUNTER — Other Ambulatory Visit (HOSPITAL_BASED_OUTPATIENT_CLINIC_OR_DEPARTMENT_OTHER): Payer: Medicare Other

## 2013-02-23 ENCOUNTER — Other Ambulatory Visit: Payer: Self-pay | Admitting: *Deleted

## 2013-02-23 ENCOUNTER — Ambulatory Visit (HOSPITAL_BASED_OUTPATIENT_CLINIC_OR_DEPARTMENT_OTHER): Payer: Medicare Other

## 2013-02-23 ENCOUNTER — Ambulatory Visit (HOSPITAL_COMMUNITY)
Admission: RE | Admit: 2013-02-23 | Discharge: 2013-02-23 | Disposition: A | Discharge status: Home / Self Care | Payer: Medicare Other | Source: Ambulatory Visit | Attending: Oncology | Admitting: Oncology

## 2013-02-23 VITALS — BP 112/54 | HR 61 | Temp 97.9°F | Resp 18

## 2013-02-23 DIAGNOSIS — D649 Anemia, unspecified: Secondary | ICD-10-CM | POA: Insufficient documentation

## 2013-02-23 DIAGNOSIS — D509 Iron deficiency anemia, unspecified: Secondary | ICD-10-CM

## 2013-02-23 DIAGNOSIS — K31819 Angiodysplasia of stomach and duodenum without bleeding: Secondary | ICD-10-CM

## 2013-02-23 DIAGNOSIS — K922 Gastrointestinal hemorrhage, unspecified: Secondary | ICD-10-CM

## 2013-02-23 DIAGNOSIS — I780 Hereditary hemorrhagic telangiectasia: Secondary | ICD-10-CM

## 2013-02-23 LAB — FERRITIN CHCC: FERRITIN: 9 ng/mL (ref 9–269)

## 2013-02-23 LAB — CBC WITH DIFFERENTIAL/PLATELET
BASO%: 0.9 % (ref 0.0–2.0)
Basophils Absolute: 0 10*3/uL (ref 0.0–0.1)
EOS%: 7.8 % — AB (ref 0.0–7.0)
Eosinophils Absolute: 0.4 10*3/uL (ref 0.0–0.5)
HCT: 22.1 % — ABNORMAL LOW (ref 34.8–46.6)
HEMOGLOBIN: 7.2 g/dL — AB (ref 11.6–15.9)
LYMPH#: 0.8 10*3/uL — AB (ref 0.9–3.3)
LYMPH%: 15 % (ref 14.0–49.7)
MCH: 25.8 pg (ref 25.1–34.0)
MCHC: 32.5 g/dL (ref 31.5–36.0)
MCV: 79.4 fL — ABNORMAL LOW (ref 79.5–101.0)
MONO#: 0.5 10*3/uL (ref 0.1–0.9)
MONO%: 10.6 % (ref 0.0–14.0)
NEUT#: 3.3 10*3/uL (ref 1.5–6.5)
NEUT%: 65.7 % (ref 38.4–76.8)
Platelets: 272 10*3/uL (ref 145–400)
RBC: 2.79 10*6/uL — AB (ref 3.70–5.45)
RDW: 19.2 % — AB (ref 11.2–14.5)
WBC: 5.1 10*3/uL (ref 3.9–10.3)

## 2013-02-23 LAB — IRON AND TIBC CHCC
%SAT: 12 % — ABNORMAL LOW (ref 21–57)
IRON: 34 ug/dL — AB (ref 41–142)
TIBC: 286 ug/dL (ref 236–444)
UIBC: 252 ug/dL (ref 120–384)

## 2013-02-23 LAB — PREPARE RBC (CROSSMATCH)

## 2013-02-23 LAB — HOLD TUBE, BLOOD BANK

## 2013-02-23 MED ORDER — SODIUM CHLORIDE 0.9 % IV SOLN
250.0000 mL | Freq: Once | INTRAVENOUS | Status: AC
  Administered 2013-02-23: 250 mL via INTRAVENOUS

## 2013-02-23 NOTE — Patient Instructions (Signed)
Blood Transfusion Information WHAT IS A BLOOD TRANSFUSION? A transfusion is the replacement of blood or some of its parts. Blood is made up of multiple cells which provide different functions.  Red blood cells carry oxygen and are used for blood loss replacement.  White blood cells fight against infection.  Platelets control bleeding.  Plasma helps clot blood.  Other blood products are available for specialized needs, such as hemophilia or other clotting disorders. BEFORE THE TRANSFUSION  Who gives blood for transfusions?   You may be able to donate blood to be used at a later date on yourself (autologous donation).  Relatives can be asked to donate blood. This is generally not any safer than if you have received blood from a stranger. The same precautions are taken to ensure safety when a relative's blood is donated.  Healthy volunteers who are fully evaluated to make sure their blood is safe. This is blood bank blood. Transfusion therapy is the safest it has ever been in the practice of medicine. Before blood is taken from a donor, a complete history is taken to make sure that person has no history of diseases nor engages in risky social behavior (examples are intravenous drug use or sexual activity with multiple partners). The donor's travel history is screened to minimize risk of transmitting infections, such as malaria. The donated blood is tested for signs of infectious diseases, such as HIV and hepatitis. The blood is then tested to be sure it is compatible with you in order to minimize the chance of a transfusion reaction. If you or a relative donates blood, this is often done in anticipation of surgery and is not appropriate for emergency situations. It takes many days to process the donated blood. RISKS AND COMPLICATIONS Although transfusion therapy is very safe and saves many lives, the main dangers of transfusion include:   Getting an infectious disease.  Developing a  transfusion reaction. This is an allergic reaction to something in the blood you were given. Every precaution is taken to prevent this. The decision to have a blood transfusion has been considered carefully by your caregiver before blood is given. Blood is not given unless the benefits outweigh the risks. AFTER THE TRANSFUSION  Right after receiving a blood transfusion, you will usually feel much better and more energetic. This is especially true if your red blood cells have gotten low (anemic). The transfusion raises the level of the red blood cells which carry oxygen, and this usually causes an energy increase.  The nurse administering the transfusion will monitor you carefully for complications. HOME CARE INSTRUCTIONS  No special instructions are needed after a transfusion. You may find your energy is better. Speak with your caregiver about any limitations on activity for underlying diseases you may have. SEEK MEDICAL CARE IF:   Your condition is not improving after your transfusion.  You develop redness or irritation at the intravenous (IV) site. SEEK IMMEDIATE MEDICAL CARE IF:  Any of the following symptoms occur over the next 12 hours:  Shaking chills.  You have a temperature by mouth above 102 F (38.9 C), not controlled by medicine.  Chest, back, or muscle pain.  People around you feel you are not acting correctly or are confused.  Shortness of breath or difficulty breathing.  Dizziness and fainting.  You get a rash or develop hives.  You have a decrease in urine output.  Your urine turns a dark color or changes to pink, red, or brown. Any of the following   symptoms occur over the next 10 days:  You have a temperature by mouth above 102 F (38.9 C), not controlled by medicine.  Shortness of breath.  Weakness after normal activity.  The white part of the eye turns yellow (jaundice).  You have a decrease in the amount of urine or are urinating less often.  Your  urine turns a dark color or changes to pink, red, or brown. Document Released: 01/24/2000 Document Revised: 04/20/2011 Document Reviewed: 09/12/2007 [employer] Patient Information 2014 [employer].  

## 2013-02-27 LAB — TYPE AND SCREEN
ABO/RH(D): A POS
Antibody Screen: NEGATIVE
UNIT DIVISION: 0
Unit division: 0

## 2013-03-06 ENCOUNTER — Ambulatory Visit
Admission: RE | Admit: 2013-03-06 | Discharge: 2013-03-06 | Disposition: A | Discharge status: Home / Self Care | Payer: Self-pay | Source: Ambulatory Visit

## 2013-03-06 DIAGNOSIS — Z1231 Encounter for screening mammogram for malignant neoplasm of breast: Secondary | ICD-10-CM

## 2013-03-07 ENCOUNTER — Other Ambulatory Visit: Payer: Self-pay | Admitting: Internal Medicine

## 2013-03-07 DIAGNOSIS — R928 Other abnormal and inconclusive findings on diagnostic imaging of breast: Secondary | ICD-10-CM

## 2013-03-08 ENCOUNTER — Ambulatory Visit: Payer: Medicare Other

## 2013-03-17 ENCOUNTER — Ambulatory Visit
Admission: RE | Admit: 2013-03-17 | Discharge: 2013-03-17 | Disposition: A | Discharge status: Home / Self Care | Payer: Medicare Other | Source: Ambulatory Visit | Attending: Internal Medicine | Admitting: Internal Medicine

## 2013-03-17 DIAGNOSIS — R928 Other abnormal and inconclusive findings on diagnostic imaging of breast: Secondary | ICD-10-CM

## 2013-03-30 ENCOUNTER — Ambulatory Visit (HOSPITAL_COMMUNITY)
Admission: RE | Admit: 2013-03-30 | Discharge: 2013-03-30 | Disposition: A | Discharge status: Home / Self Care | Payer: Medicare Other | Source: Ambulatory Visit | Attending: Oncology | Admitting: Oncology

## 2013-03-30 ENCOUNTER — Other Ambulatory Visit (HOSPITAL_BASED_OUTPATIENT_CLINIC_OR_DEPARTMENT_OTHER): Payer: Medicare Other

## 2013-03-30 ENCOUNTER — Ambulatory Visit (HOSPITAL_BASED_OUTPATIENT_CLINIC_OR_DEPARTMENT_OTHER): Payer: Medicare Other

## 2013-03-30 ENCOUNTER — Other Ambulatory Visit: Payer: Self-pay | Admitting: *Deleted

## 2013-03-30 VITALS — BP 153/77 | HR 65 | Temp 98.7°F | Resp 16

## 2013-03-30 DIAGNOSIS — D649 Anemia, unspecified: Secondary | ICD-10-CM

## 2013-03-30 DIAGNOSIS — I780 Hereditary hemorrhagic telangiectasia: Secondary | ICD-10-CM

## 2013-03-30 DIAGNOSIS — K31819 Angiodysplasia of stomach and duodenum without bleeding: Secondary | ICD-10-CM

## 2013-03-30 DIAGNOSIS — K922 Gastrointestinal hemorrhage, unspecified: Secondary | ICD-10-CM

## 2013-03-30 DIAGNOSIS — D509 Iron deficiency anemia, unspecified: Secondary | ICD-10-CM

## 2013-03-30 LAB — CBC WITH DIFFERENTIAL/PLATELET
BASO%: 0.8 % (ref 0.0–2.0)
BASOS ABS: 0 10*3/uL (ref 0.0–0.1)
EOS ABS: 0.3 10*3/uL (ref 0.0–0.5)
EOS%: 6.4 % (ref 0.0–7.0)
HCT: 20.5 % — ABNORMAL LOW (ref 34.8–46.6)
HEMOGLOBIN: 6.3 g/dL — AB (ref 11.6–15.9)
LYMPH#: 0.6 10*3/uL — AB (ref 0.9–3.3)
LYMPH%: 13.1 % — ABNORMAL LOW (ref 14.0–49.7)
MCH: 21.7 pg — ABNORMAL LOW (ref 25.1–34.0)
MCHC: 30.9 g/dL — ABNORMAL LOW (ref 31.5–36.0)
MCV: 70.1 fL — ABNORMAL LOW (ref 79.5–101.0)
MONO#: 0.5 10*3/uL (ref 0.1–0.9)
MONO%: 9.8 % (ref 0.0–14.0)
NEUT%: 69.9 % (ref 38.4–76.8)
NEUTROS ABS: 3.4 10*3/uL (ref 1.5–6.5)
Platelets: 174 10*3/uL (ref 145–400)
RBC: 2.93 10*6/uL — ABNORMAL LOW (ref 3.70–5.45)
RDW: 20.9 % — AB (ref 11.2–14.5)
WBC: 4.8 10*3/uL (ref 3.9–10.3)

## 2013-03-30 LAB — IRON AND TIBC CHCC
%SAT: 8 % — AB (ref 21–57)
IRON: 27 ug/dL — AB (ref 41–142)
TIBC: 334 ug/dL (ref 236–444)
UIBC: 307 ug/dL (ref 120–384)

## 2013-03-30 LAB — HOLD TUBE, BLOOD BANK

## 2013-03-30 LAB — FERRITIN CHCC

## 2013-03-30 LAB — PREPARE RBC (CROSSMATCH)

## 2013-03-30 MED ORDER — SODIUM CHLORIDE 0.9 % IV SOLN
250.0000 mL | Freq: Once | INTRAVENOUS | Status: AC
  Administered 2013-03-30: 250 mL via INTRAVENOUS

## 2013-03-30 NOTE — Patient Instructions (Signed)
Blood Transfusion  A blood transfusion replaces your blood or some of its parts. Blood is replaced when you have lost blood because of surgery, an accident, or for severe blood conditions like anemia. You can donate blood to be used on yourself if you have a planned surgery. If you lose blood during that surgery, your own blood can be given back to you. Any blood given to you is checked to make sure it matches your blood type. Your temperature, blood pressure, and heart rate (vital signs) will be checked often.  GET HELP RIGHT AWAY IF:   You feel sick to your stomach (nauseous) or throw up (vomit).  You have watery poop (diarrhea).  You have shortness of breath or trouble breathing.  You have blood in your pee (urine) or have dark colored pee.  You have chest pain or tightness.  Your eyes or skin turn yellow (jaundice).  You have a temperature by mouth above 102 F (38.9 C), not controlled by medicine.  You start to shake and have chills.  You develop a a red rash (hives) or feel itchy.  You develop lightheadedness or feel confused.  You develop back, joint, or muscle pain.  You do not feel hungry (lost appetite).  You feel tired, restless, or nervous.  You develop belly (abdominal) cramps. Document Released: 04/24/2008 Document Revised: 04/20/2011 Document Reviewed: 04/24/2008 Exit[employer] Patient Information 2014 [employer].  

## 2013-03-31 LAB — TYPE AND SCREEN
ABO/RH(D): A POS
Antibody Screen: NEGATIVE
UNIT DIVISION: 0
Unit division: 0

## 2013-04-06 ENCOUNTER — Other Ambulatory Visit: Payer: Self-pay | Admitting: Oncology

## 2013-04-09 ENCOUNTER — Encounter: Payer: Self-pay | Admitting: Oncology

## 2013-04-27 ENCOUNTER — Other Ambulatory Visit (HOSPITAL_BASED_OUTPATIENT_CLINIC_OR_DEPARTMENT_OTHER): Payer: Medicare Other

## 2013-04-27 ENCOUNTER — Other Ambulatory Visit: Payer: Self-pay | Admitting: *Deleted

## 2013-04-27 ENCOUNTER — Encounter: Payer: Self-pay | Admitting: Oncology

## 2013-04-27 ENCOUNTER — Ambulatory Visit (HOSPITAL_COMMUNITY)
Admission: RE | Admit: 2013-04-27 | Discharge: 2013-04-27 | Disposition: A | Discharge status: Home / Self Care | Payer: Medicare Other | Source: Ambulatory Visit | Attending: Oncology | Admitting: Oncology

## 2013-04-27 ENCOUNTER — Other Ambulatory Visit: Payer: Self-pay | Admitting: Oncology

## 2013-04-27 DIAGNOSIS — K31819 Angiodysplasia of stomach and duodenum without bleeding: Secondary | ICD-10-CM

## 2013-04-27 DIAGNOSIS — D649 Anemia, unspecified: Secondary | ICD-10-CM

## 2013-04-27 DIAGNOSIS — K922 Gastrointestinal hemorrhage, unspecified: Secondary | ICD-10-CM

## 2013-04-27 DIAGNOSIS — D509 Iron deficiency anemia, unspecified: Secondary | ICD-10-CM

## 2013-04-27 DIAGNOSIS — I780 Hereditary hemorrhagic telangiectasia: Secondary | ICD-10-CM

## 2013-04-27 LAB — CBC WITH DIFFERENTIAL/PLATELET
BASO%: 0.7 % (ref 0.0–2.0)
BASOS ABS: 0 10*3/uL (ref 0.0–0.1)
EOS%: 8.8 % — ABNORMAL HIGH (ref 0.0–7.0)
Eosinophils Absolute: 0.4 10*3/uL (ref 0.0–0.5)
HEMATOCRIT: 20.6 % — AB (ref 34.8–46.6)
HEMOGLOBIN: 6.3 g/dL — AB (ref 11.6–15.9)
LYMPH%: 14.5 % (ref 14.0–49.7)
MCH: 21.1 pg — AB (ref 25.1–34.0)
MCHC: 30.4 g/dL — ABNORMAL LOW (ref 31.5–36.0)
MCV: 69.3 fL — ABNORMAL LOW (ref 79.5–101.0)
MONO#: 0.5 10*3/uL (ref 0.1–0.9)
MONO%: 9.7 % (ref 0.0–14.0)
NEUT#: 3.1 10*3/uL (ref 1.5–6.5)
NEUT%: 66.3 % (ref 38.4–76.8)
Platelets: 195 10*3/uL (ref 145–400)
RBC: 2.97 10*6/uL — ABNORMAL LOW (ref 3.70–5.45)
RDW: 21.4 % — ABNORMAL HIGH (ref 11.2–14.5)
WBC: 4.7 10*3/uL (ref 3.9–10.3)
lymph#: 0.7 10*3/uL — ABNORMAL LOW (ref 0.9–3.3)

## 2013-04-27 LAB — IRON AND TIBC CHCC
%SAT: 8 % — AB (ref 21–57)
IRON: 26 ug/dL — AB (ref 41–142)
TIBC: 321 ug/dL (ref 236–444)
UIBC: 295 ug/dL (ref 120–384)

## 2013-04-27 LAB — FERRITIN CHCC: Ferritin: <4 ng/ml — ABNORMAL LOW (ref 9–269)

## 2013-04-27 LAB — HOLD TUBE, BLOOD BANK

## 2013-04-28 ENCOUNTER — Ambulatory Visit (HOSPITAL_BASED_OUTPATIENT_CLINIC_OR_DEPARTMENT_OTHER): Payer: Medicare Other

## 2013-04-28 VITALS — BP 140/45 | HR 59 | Temp 98.1°F | Resp 16

## 2013-04-28 DIAGNOSIS — K31819 Angiodysplasia of stomach and duodenum without bleeding: Secondary | ICD-10-CM

## 2013-04-28 DIAGNOSIS — D509 Iron deficiency anemia, unspecified: Secondary | ICD-10-CM

## 2013-04-28 DIAGNOSIS — K922 Gastrointestinal hemorrhage, unspecified: Secondary | ICD-10-CM

## 2013-04-28 DIAGNOSIS — D649 Anemia, unspecified: Secondary | ICD-10-CM

## 2013-04-28 DIAGNOSIS — I780 Hereditary hemorrhagic telangiectasia: Secondary | ICD-10-CM

## 2013-04-28 LAB — PREPARE RBC (CROSSMATCH)

## 2013-04-28 MED ORDER — SODIUM CHLORIDE 0.9 % IV SOLN
1020.0000 mg | Freq: Once | INTRAVENOUS | Status: AC
  Administered 2013-04-28: 1020 mg via INTRAVENOUS
  Filled 2013-04-28: qty 34

## 2013-04-28 MED ORDER — SODIUM CHLORIDE 0.9 % IV SOLN: 250.0000 mL | Freq: Once | INTRAVENOUS | Status: DC

## 2013-04-28 NOTE — Patient Instructions (Signed)
Blood Transfusion  A blood transfusion replaces your blood or some of its parts. Blood is replaced when you have lost blood because of surgery, an accident, or for severe blood conditions like anemia. You can donate blood to be used on yourself if you have a planned surgery. If you lose blood during that surgery, your own blood can be given back to you. Any blood given to you is checked to make sure it matches your blood type. Your temperature, blood pressure, and heart rate (vital signs) will be checked often.  GET HELP RIGHT AWAY IF:   You feel sick to your stomach (nauseous) or throw up (vomit).  You have watery poop (diarrhea).  You have shortness of breath or trouble breathing.  You have blood in your pee (urine) or have dark colored pee.  You have chest pain or tightness.  Your eyes or skin turn yellow (jaundice).  You have a temperature by mouth above 102 F (38.9 C), not controlled by medicine.  You start to shake and have chills.  You develop a a red rash (hives) or feel itchy.  You develop lightheadedness or feel confused.  You develop back, joint, or muscle pain.  You do not feel hungry (lost appetite).  You feel tired, restless, or nervous.  You develop belly (abdominal) cramps. Document Released: 04/24/2008 Document Revised: 04/20/2011 Document Reviewed: 04/24/2008 [facility] Patient Information 2014 [place], [place].  Ferumoxytol injection What is this medicine? FERUMOXYTOL is an iron complex. Iron is used to make healthy red blood cells, which carry oxygen and nutrients throughout the body. This medicine is used to treat iron deficiency anemia in people with chronic kidney disease. This medicine may be used for other purposes; ask your health care provider or pharmacist if you have questions. COMMON BRAND NAME(S): Feraheme  What should I tell my health care provider before I take this medicine? They need to know if you have any of these conditions: -anemia not  caused by low iron levels -high levels of iron in the blood -magnetic resonance imaging (MRI) test scheduled -an unusual or allergic reaction to iron, other medicines, foods, dyes, or preservatives -pregnant or trying to get pregnant -breast-feeding How should I use this medicine? This medicine is for injection into a vein. It is given by a health care professional in a hospital or clinic setting. Talk to your pediatrician regarding the use of this medicine in children. Special care may be needed. Overdosage: If you think you've taken too much of this medicine contact a poison control center or emergency room at once. Overdosage: If you think you have taken too much of this medicine contact a poison control center or emergency room at once. NOTE: This medicine is only for you. Do not share this medicine with others. What if I miss a dose? It is important not to miss your dose. Call your doctor or health care professional if you are unable to keep an appointment. What may interact with this medicine? This medicine may interact with the following medications: -other iron products This list may not describe all possible interactions. Give your health care provider a list of all the medicines, herbs, non-prescription drugs, or dietary supplements you use. Also tell them if you smoke, drink alcohol, or use illegal drugs. Some items may interact with your medicine. What should I watch for while using this medicine? Visit your doctor or healthcare professional regularly. Tell your doctor or healthcare professional if your symptoms do not start to get better or  if they get worse. You may need blood work done while you are taking this medicine. You may need to follow a special diet. Talk to your doctor. Foods that contain iron include: whole grains/cereals, dried fruits, beans, or peas, leafy green vegetables, and organ meats (liver, kidney). What side effects may I notice from receiving this  medicine? Side effects that you should report to your doctor or health care professional as soon as possible: -allergic reactions like skin rash, itching or hives, swelling of the face, lips, or tongue -breathing problems -changes in blood pressure -feeling faint or lightheaded, falls -fever or chills -flushing, sweating, or hot feelings -swelling of the ankles or feet Side effects that usually do not require medical attention (Report these to your doctor or health care professional if they continue or are bothersome.): -diarrhea -headache -nausea, vomiting -stomach pain This list may not describe all possible side effects. Call your doctor for medical advice about side effects. You may report side effects to FDA at 1-800-FDA-1088. Where should I keep my medicine? This drug is given in a hospital or clinic and will not be stored at home. NOTE: This sheet is a summary. It may not cover all possible information. If you have questions about this medicine, talk to your doctor, pharmacist, or health care provider.  2014, Elsevier/Gold Standard. (2011-09-11 15:23:36)

## 2013-04-29 LAB — TYPE AND SCREEN
ABO/RH(D): A POS
Antibody Screen: NEGATIVE
UNIT DIVISION: 0
UNIT DIVISION: 0

## 2013-05-26 ENCOUNTER — Other Ambulatory Visit: Payer: Medicare Other

## 2013-06-02 ENCOUNTER — Ambulatory Visit: Payer: Medicare Other | Admitting: Oncology

## 2013-06-06 ENCOUNTER — Ambulatory Visit (INDEPENDENT_AMBULATORY_CARE_PROVIDER_SITE_OTHER): Payer: Medicare Other | Admitting: Oncology

## 2013-06-06 ENCOUNTER — Other Ambulatory Visit: Payer: Self-pay | Admitting: Oncology

## 2013-06-06 ENCOUNTER — Encounter: Payer: Self-pay | Admitting: Oncology

## 2013-06-06 ENCOUNTER — Telehealth: Payer: Self-pay | Admitting: *Deleted

## 2013-06-06 VITALS — BP 154/56 | HR 75 | Temp 98.6°F | Ht 63.0 in | Wt 152.8 lb

## 2013-06-06 DIAGNOSIS — R0989 Other specified symptoms and signs involving the circulatory and respiratory systems: Secondary | ICD-10-CM

## 2013-06-06 DIAGNOSIS — E119 Type 2 diabetes mellitus without complications: Secondary | ICD-10-CM

## 2013-06-06 DIAGNOSIS — D509 Iron deficiency anemia, unspecified: Secondary | ICD-10-CM

## 2013-06-06 DIAGNOSIS — I359 Nonrheumatic aortic valve disorder, unspecified: Secondary | ICD-10-CM

## 2013-06-06 DIAGNOSIS — K31819 Angiodysplasia of stomach and duodenum without bleeding: Secondary | ICD-10-CM

## 2013-06-06 DIAGNOSIS — I10 Essential (primary) hypertension: Secondary | ICD-10-CM

## 2013-06-06 DIAGNOSIS — K922 Gastrointestinal hemorrhage, unspecified: Secondary | ICD-10-CM

## 2013-06-06 DIAGNOSIS — L439 Lichen planus, unspecified: Secondary | ICD-10-CM

## 2013-06-06 DIAGNOSIS — I780 Hereditary hemorrhagic telangiectasia: Secondary | ICD-10-CM

## 2013-06-06 LAB — CBC WITH DIFFERENTIAL/PLATELET
BASOS ABS: 0 10*3/uL (ref 0.0–0.1)
Basophils Relative: 0 % (ref 0–1)
EOS ABS: 0.4 10*3/uL (ref 0.0–0.7)
EOS PCT: 8 % — AB (ref 0–5)
HCT: 23.9 % — ABNORMAL LOW (ref 36.0–46.0)
Hemoglobin: 7.7 g/dL — ABNORMAL LOW (ref 12.0–15.0)
Lymphocytes Relative: 12 % (ref 12–46)
Lymphs Abs: 0.7 10*3/uL (ref 0.7–4.0)
MCH: 27.2 pg (ref 26.0–34.0)
MCHC: 32.2 g/dL (ref 30.0–36.0)
MCV: 84.5 fL (ref 78.0–100.0)
Monocytes Absolute: 0.5 10*3/uL (ref 0.1–1.0)
Monocytes Relative: 9 % (ref 3–12)
NEUTROS PCT: 71 % (ref 43–77)
Neutro Abs: 4 10*3/uL (ref 1.7–7.7)
Platelets: 194 10*3/uL (ref 150–400)
RBC: 2.83 MIL/uL — AB (ref 3.87–5.11)
RDW: 18.7 % — AB (ref 11.5–15.5)
WBC: 5.6 10*3/uL (ref 4.0–10.5)

## 2013-06-06 LAB — IRON AND TIBC
%SAT: 10 % — AB (ref 20–55)
IRON: 29 ug/dL — AB (ref 42–145)
TIBC: 281 ug/dL (ref 250–470)
UIBC: 252 ug/dL (ref 125–400)

## 2013-06-06 LAB — COMPREHENSIVE METABOLIC PANEL
ALBUMIN: 3.2 g/dL — AB (ref 3.5–5.2)
ALK PHOS: 94 U/L (ref 39–117)
ALT: 23 U/L (ref 0–35)
AST: 28 U/L (ref 0–37)
BILIRUBIN TOTAL: 0.3 mg/dL (ref 0.3–1.2)
BUN: 19 mg/dL (ref 6–23)
CO2: 25 meq/L (ref 19–32)
Calcium: 9.3 mg/dL (ref 8.4–10.5)
Chloride: 105 mEq/L (ref 96–112)
Creat: 0.76 mg/dL (ref 0.50–1.10)
GLUCOSE: 212 mg/dL — AB (ref 70–99)
POTASSIUM: 4 meq/L (ref 3.5–5.3)
Sodium: 142 mEq/L (ref 135–145)
Total Protein: 6 g/dL (ref 6.0–8.3)

## 2013-06-06 LAB — FERRITIN: Ferritin: 10 ng/mL (ref 10–291)

## 2013-06-06 NOTE — Telephone Encounter (Signed)
Pt was called/informed to go to Admissions [facility]) to register between [place] then they will take her to Short Stay for blood transfusion @ 0900AM. Pt voiced understanding.

## 2013-06-06 NOTE — Patient Instructions (Signed)
Lab here today Continue lab once a month at [facility] clinic Visit with Dr [name] in 4 months: August 18  15 minutes We will call you back if you need a blood transfusion:  We will give the blood at [facility] unit on second floor of hospital

## 2013-06-06 NOTE — Progress Notes (Signed)
Patient ID: RETINA KUSAK, female   DOB: Dec 04, 1940, 73 y.o.   MRN: [id number] Hematology and Oncology Follow Up Visit  MAYLASIA SHOBE [id number] 22-Oct-1940 73 y.o. 06/06/2013 2:34 PM   Principle Diagnosis: Encounter Diagnoses  Name Primary?  . Osler-Weber-Rendu disease Yes  . ANEMIA, IRON DEFICIENCY NOS   . 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 The majority of her bleeding is related to the nasal mucosa and the gastric mucosa. She has had a number of endoscopic procedures by Dr. Teena Irani most recently 02/26/2011. AVMs noted in the proximal stomach and in the duodenum. She had a repeat laser fulguration of a number of these lesions at that time. She has recurrent epistaxis. She underwent a right nasal septoplasty procedure with a full thickness skin graft by Dr. Radene Journey on 11/14/2009. Frequency and severity of her nosebleeds has decreased but not stopped completely.  She has been followed in our office since September 2008. She requires periodic blood transfusions and parenteral iron infusions to maintain her hemoglobin at 10 g or above. Her average hemoglobin runs around 9 g.   Initial evaluation including pro time, PTT, Coombs' test, haptoglobin, von Willebrand profile, back in 2008   were normal. Her urine tested negative for blood.   Blood type is A, negative.  She has adjusted well to chronic anemia and has minimal or no symptoms until hemoglobin falls to less than 6 g. Hemoglobin today is 7.7 g and she denies any dyspnea, palpitations, or chest pain.  She believes an older brother may be affected. He was put on a birth control pill. (Estrogen); as well as a younger sister age 68 who has required a subtotal gastrectomy in the past for bleeding. She is one of 18 children. Some of the children died in childbirth. She has 3 surviving brothers and one  sister.  [place] Her father murdered her mother and was sent to prison.  She is married but never had any children. She has had no interim medical problems. She is a diabetic on both insulin and oral agents. She has not had any major bleeding episodes since her last visit with [place] in October 2014 but she continues to lose blood from her nasal mucosa and her GI tract. We are monitoring blood counts on a monthly basis. Hemoglobin was 6.3 on March 19 and she received a blood transfusion. She also received a infusion of parenteral iron at that time. She does admit to intermittent black stools. No gross hematochezia. No recent major epistaxis.  She just lost an 39-month-old great-nephew who drowned here in [place].   Medications: reviewed  Allergies:  Allergies  Allergen Reactions  . Aspirin Nausea And Vomiting    Review of Systems: Hematology:  See above ENT ROS: No sore throat Breast ROS:  Respiratory ROS: No cough or dyspnea Cardiovascular ROS:  See above Gastrointestinal ROS: See above    Genito-Urinary ROS: She denies any hematuria or vaginal bleeding Musculoskeletal ROS: No major muscle bone or joint pain Neurological ROS: No headache or change in vision. She reports that she is nervous. Dermatological ROS: No rash or ecchymosis Remaining ROS negative:   Physical Exam: Blood pressure 154/56, pulse 75, temperature 98.6 F (37 C), temperature source Oral, height 5\' 3"  (1.6 m), weight 152 lb 12.8 oz (69.31 kg), SpO2 99.00%. Wt  Readings from Last 3 Encounters:  06/06/13 152 lb 12.8 oz (69.31 kg)  11/28/12 154 lb 11.2 oz (70.171 kg)  11/08/12 154 lb 12.2 oz (70.2 kg)     General appearance: Thin African American woman HENNT: Pharynx no erythema, exudate, mass, or ulcer. No oropharyngeal to agitation. No thyromegaly or thyroid nodules Lymph nodes: No cervical, supraclavicular, or axillary lymphadenopathy Breasts Lungs: Clear to auscultation, resonant to  percussion throughout Heart: Regular rhythm,  2- 3/6 aortic systolic murmur transmitted to the left sternal border , no gallop, no rub, no click, no edema Abdomen: Soft, nontender, normal bowel sounds, no mass, no organomegaly Extremities: No edema, no calf tenderness Musculoskeletal: no joint deformities GU:  Vascular: Carotid pulses 2+, bilateral carotid bruits,  Neurologic: Alert, oriented, PERRLA, optic discs sharp and vessels normal, no hemorrhage or exudate, cranial nerves grossly normal, motor strength 5 over 5, reflexes 1+ symmetric, upper body coordination normal, gait normal, Skin: No rash or ecchymosis. Small telangiectasia left ring finger and right index finger palmar surface  Lab Results: CBC W/Diff    Component Value Date/Time   WBC 5.6 06/06/2013 0947   WBC 4.7 04/27/2013 0949   RBC 2.83* 06/06/2013 0947   RBC 2.97* 04/27/2013 0949   RBC 3.19* 02/01/2011 0839   HGB 7.7* 06/06/2013 0947   HGB 6.3* 04/27/2013 0949   HCT 23.9* 06/06/2013 0947   HCT 20.6* 04/27/2013 0949   PLT 194 06/06/2013 0947   PLT 195 04/27/2013 0949   MCV 84.5 06/06/2013 0947   MCV 69.3* 04/27/2013 0949   MCH 27.2 06/06/2013 0947   MCH 21.1* 04/27/2013 0949   MCHC 32.2 06/06/2013 0947   MCHC 30.4* 04/27/2013 0949   RDW 18.7* 06/06/2013 0947   RDW 21.4* 04/27/2013 0949   LYMPHSABS 0.7 06/06/2013 0947   LYMPHSABS 0.7* 04/27/2013 0949   MONOABS 0.5 06/06/2013 0947   MONOABS 0.5 04/27/2013 0949   EOSABS 0.4 06/06/2013 0947   EOSABS 0.4 04/27/2013 0949   BASOSABS 0.0 06/06/2013 0947   BASOSABS 0.0 04/27/2013 0949     Chemistry      Component Value Date/Time   NA 142 06/06/2013 0947   NA 142 12/28/2011 0825   K 4.0 06/06/2013 0947   K 3.9 12/28/2011 0825   CL 105 06/06/2013 0947   CL 111* 12/28/2011 0825   CO2 25 06/06/2013 0947   CO2 26 12/28/2011 0825   BUN 19 06/06/2013 0947   BUN 18.0 12/28/2011 0825   CREATININE 0.76 06/06/2013 0947   CREATININE 0.88 12/15/2012 1800   CREATININE 0.7 12/28/2011 0825       Component Value Date/Time   CALCIUM 9.3 06/06/2013 0947   CALCIUM 9.6 12/28/2011 0825   ALKPHOS 94 06/06/2013 0947   ALKPHOS 93 12/28/2011 0825   AST 28 06/06/2013 0947   AST 42* 12/28/2011 0825   ALT 23 06/06/2013 0947   ALT 37 12/28/2011 0825   BILITOT 0.3 06/06/2013 0947   BILITOT 0.37 12/28/2011 0825      Impression:  #1. HHT  #2. Ongoing nasal mucosal and GI bleeding secondary to #1.  #3. Intermittent transfusion support secondary to #2 We will bring her back tomorrow and transfuse 2 units of packed cells. Ferritin level is pending but I anticipate this will also be low and we will also need to give her parenteral iron.  #4. Type 2 diabetes now insulin-dependent  #5. Bilateral carotid bruits No history of stroke  #6. Aortic systolic murmur. With only mildly thickened trileaflet aortic valve  and normal mitral valve. Normal ejection fraction 60-65% with no wall motion abnormalities.  #7. Lichen planus skin rash-chronic  #8. Essential hypertension   CC: Patient Care Team: Philis Fendt, MD as PCP - General (Internal Medicine)   [name], MD 4/28/20152:34 PM

## 2013-06-07 ENCOUNTER — Ambulatory Visit (HOSPITAL_COMMUNITY)
Admission: RE | Admit: 2013-06-07 | Discharge: 2013-06-07 | Disposition: A | Discharge status: Home / Self Care | Payer: Medicare Other | Source: Ambulatory Visit | Attending: Oncology | Admitting: Oncology

## 2013-06-07 DIAGNOSIS — D509 Iron deficiency anemia, unspecified: Secondary | ICD-10-CM | POA: Insufficient documentation

## 2013-06-07 DIAGNOSIS — K922 Gastrointestinal hemorrhage, unspecified: Secondary | ICD-10-CM | POA: Insufficient documentation

## 2013-06-07 DIAGNOSIS — I780 Hereditary hemorrhagic telangiectasia: Secondary | ICD-10-CM | POA: Insufficient documentation

## 2013-06-07 DIAGNOSIS — Q2733 Arteriovenous malformation of digestive system vessel: Secondary | ICD-10-CM | POA: Insufficient documentation

## 2013-06-07 LAB — PREPARE RBC (CROSSMATCH)

## 2013-06-07 MED ORDER — SODIUM CHLORIDE 0.9 % IV SOLN: Freq: Once | INTRAVENOUS | Status: DC

## 2013-06-08 LAB — TYPE AND SCREEN
ABO/RH(D): A POS
ANTIBODY SCREEN: NEGATIVE
UNIT DIVISION: 0
Unit division: 0

## 2013-07-04 ENCOUNTER — Other Ambulatory Visit (INDEPENDENT_AMBULATORY_CARE_PROVIDER_SITE_OTHER): Payer: Medicare Other

## 2013-07-04 DIAGNOSIS — D509 Iron deficiency anemia, unspecified: Secondary | ICD-10-CM

## 2013-07-04 DIAGNOSIS — Q2733 Arteriovenous malformation of digestive system vessel: Secondary | ICD-10-CM

## 2013-07-04 DIAGNOSIS — I780 Hereditary hemorrhagic telangiectasia: Secondary | ICD-10-CM

## 2013-07-04 DIAGNOSIS — K31819 Angiodysplasia of stomach and duodenum without bleeding: Secondary | ICD-10-CM

## 2013-07-04 LAB — CBC WITH DIFFERENTIAL/PLATELET
Basophils Absolute: 0 10*3/uL (ref 0.0–0.1)
Basophils Relative: 1 % (ref 0–1)
EOS ABS: 0.3 10*3/uL (ref 0.0–0.7)
Eosinophils Relative: 7 % — ABNORMAL HIGH (ref 0–5)
HEMATOCRIT: 22.9 % — AB (ref 36.0–46.0)
Hemoglobin: 7.3 g/dL — ABNORMAL LOW (ref 12.0–15.0)
LYMPHS ABS: 1.1 10*3/uL (ref 0.7–4.0)
Lymphocytes Relative: 22 % (ref 12–46)
MCH: 25.1 pg — AB (ref 26.0–34.0)
MCHC: 31.9 g/dL (ref 30.0–36.0)
MCV: 78.7 fL (ref 78.0–100.0)
MONO ABS: 0.5 10*3/uL (ref 0.1–1.0)
MONOS PCT: 11 % (ref 3–12)
NEUTROS ABS: 2.9 10*3/uL (ref 1.7–7.7)
NEUTROS PCT: 59 % (ref 43–77)
Platelets: 192 10*3/uL (ref 150–400)
RBC: 2.91 MIL/uL — AB (ref 3.87–5.11)
RDW: 17 % — ABNORMAL HIGH (ref 11.5–15.5)
WBC: 4.9 10*3/uL (ref 4.0–10.5)

## 2013-07-04 LAB — IRON AND TIBC
%SAT: 5 % — AB (ref 20–55)
IRON: 14 ug/dL — AB (ref 42–145)
TIBC: 285 ug/dL (ref 250–470)
UIBC: 271 ug/dL (ref 125–400)

## 2013-07-04 LAB — FERRITIN: Ferritin: 7 ng/mL — ABNORMAL LOW (ref 10–291)

## 2013-07-05 ENCOUNTER — Telehealth: Payer: Self-pay | Admitting: *Deleted

## 2013-07-05 ENCOUNTER — Other Ambulatory Visit: Payer: Self-pay | Admitting: Oncology

## 2013-07-05 NOTE — Telephone Encounter (Signed)
Called pt - pt informed of need for blood transfusion tomorrow morning@ 0800AM; instructed to register@ Admission office about 10 mins early. Informed hgb is 7.3 and will receive 2 units of blood - voiced understanding.  Dr [name] called/informed to order TXM.

## 2013-07-06 ENCOUNTER — Ambulatory Visit (HOSPITAL_COMMUNITY)
Admission: RE | Admit: 2013-07-06 | Discharge: 2013-07-06 | Disposition: A | Discharge status: Home / Self Care | Payer: Medicare Other | Source: Ambulatory Visit | Attending: Oncology | Admitting: Oncology

## 2013-07-06 DIAGNOSIS — D509 Iron deficiency anemia, unspecified: Secondary | ICD-10-CM | POA: Insufficient documentation

## 2013-07-06 LAB — PREPARE RBC (CROSSMATCH)

## 2013-07-07 LAB — TYPE AND SCREEN
ABO/RH(D): A POS
Antibody Screen: NEGATIVE
Unit division: 0
Unit division: 0

## 2013-08-01 ENCOUNTER — Other Ambulatory Visit: Payer: Self-pay | Admitting: Oncology

## 2013-08-01 ENCOUNTER — Other Ambulatory Visit (INDEPENDENT_AMBULATORY_CARE_PROVIDER_SITE_OTHER): Payer: Medicare Other

## 2013-08-01 DIAGNOSIS — Q2733 Arteriovenous malformation of digestive system vessel: Secondary | ICD-10-CM

## 2013-08-01 DIAGNOSIS — I780 Hereditary hemorrhagic telangiectasia: Secondary | ICD-10-CM

## 2013-08-01 DIAGNOSIS — D509 Iron deficiency anemia, unspecified: Secondary | ICD-10-CM

## 2013-08-01 DIAGNOSIS — K31819 Angiodysplasia of stomach and duodenum without bleeding: Secondary | ICD-10-CM

## 2013-08-01 LAB — CBC WITH DIFFERENTIAL/PLATELET
Basophils Absolute: 0 10*3/uL (ref 0.0–0.1)
Basophils Relative: 0 % (ref 0–1)
EOS PCT: 6 % — AB (ref 0–5)
Eosinophils Absolute: 0.3 10*3/uL (ref 0.0–0.7)
HEMATOCRIT: 26.7 % — AB (ref 36.0–46.0)
HEMOGLOBIN: 8.3 g/dL — AB (ref 12.0–15.0)
LYMPHS ABS: 0.9 10*3/uL (ref 0.7–4.0)
Lymphocytes Relative: 16 % (ref 12–46)
MCH: 23.4 pg — AB (ref 26.0–34.0)
MCHC: 31.1 g/dL (ref 30.0–36.0)
MCV: 75.2 fL — AB (ref 78.0–100.0)
MONOS PCT: 9 % (ref 3–12)
Monocytes Absolute: 0.5 10*3/uL (ref 0.1–1.0)
NEUTROS PCT: 69 % (ref 43–77)
Neutro Abs: 3.8 10*3/uL (ref 1.7–7.7)
Platelets: 224 10*3/uL (ref 150–400)
RBC: 3.55 MIL/uL — AB (ref 3.87–5.11)
RDW: 15.8 % — ABNORMAL HIGH (ref 11.5–15.5)
WBC: 5.5 10*3/uL (ref 4.0–10.5)

## 2013-08-01 LAB — IRON AND TIBC
%SAT: 4 % — ABNORMAL LOW (ref 20–55)
Iron: 13 ug/dL — ABNORMAL LOW (ref 42–145)
TIBC: 311 ug/dL (ref 250–470)
UIBC: 298 ug/dL (ref 125–400)

## 2013-08-01 LAB — FERRITIN: FERRITIN: 5 ng/mL — AB (ref 10–291)

## 2013-08-02 ENCOUNTER — Ambulatory Visit (HOSPITAL_BASED_OUTPATIENT_CLINIC_OR_DEPARTMENT_OTHER): Payer: Medicare Other

## 2013-08-02 ENCOUNTER — Telehealth: Payer: Self-pay | Admitting: *Deleted

## 2013-08-02 VITALS — BP 106/43 | HR 68 | Temp 98.2°F | Resp 16

## 2013-08-02 DIAGNOSIS — D509 Iron deficiency anemia, unspecified: Secondary | ICD-10-CM

## 2013-08-02 DIAGNOSIS — I780 Hereditary hemorrhagic telangiectasia: Secondary | ICD-10-CM

## 2013-08-02 DIAGNOSIS — K31819 Angiodysplasia of stomach and duodenum without bleeding: Secondary | ICD-10-CM

## 2013-08-02 MED ORDER — SODIUM CHLORIDE 0.9 % IV SOLN
1020.0000 mg | Freq: Once | INTRAVENOUS | Status: AC
  Administered 2013-08-02: 1020 mg via INTRAVENOUS
  Filled 2013-08-02: qty 34

## 2013-08-02 MED ORDER — SODIUM CHLORIDE 0.9 % IJ SOLN
3.0000 mL | Freq: Once | INTRAMUSCULAR | Status: AC | PRN
  Administered 2013-08-02: 3 mL via INTRAVENOUS
  Filled 2013-08-02: qty 10

## 2013-08-02 NOTE — Telephone Encounter (Signed)
Per note on my desk I have added patient to the schedule

## 2013-08-02 NOTE — Patient Instructions (Signed)
Ferumoxytol injection What is this medicine? FERUMOXYTOL is an iron complex. Iron is used to make healthy red blood cells, which carry oxygen and nutrients throughout the body. This medicine is used to treat iron deficiency anemia in people with chronic kidney disease. This medicine may be used for other purposes; ask your health care Darin Redmann or pharmacist if you have questions. COMMON BRAND NAME(S): Feraheme What should I tell my health care Brindle Leyba before I take this medicine? They need to know if you have any of these conditions: -anemia not caused by low iron levels -high levels of iron in the blood -magnetic resonance imaging (MRI) test scheduled -an unusual or allergic reaction to iron, other medicines, foods, dyes, or preservatives -pregnant or trying to get pregnant -breast-feeding How should I use this medicine? This medicine is for injection into a vein. It is given by a health care professional in a hospital or clinic setting. Talk to your pediatrician regarding the use of this medicine in children. Special care may be needed. Overdosage: If you think you've taken too much of this medicine contact a poison control center or emergency room at once. Overdosage: If you think you have taken too much of this medicine contact a poison control center or emergency room at once. NOTE: This medicine is only for you. Do not share this medicine with others. What if I miss a dose? It is important not to miss your dose. Call your doctor or health care professional if you are unable to keep an appointment. What may interact with this medicine? This medicine may interact with the following medications: -other iron products This list may not describe all possible interactions. Give your health care Razia Screws a list of all the medicines, herbs, non-prescription drugs, or dietary supplements you use. Also tell them if you smoke, drink alcohol, or use illegal drugs. Some items may interact with your  medicine. What should I watch for while using this medicine? Visit your doctor or healthcare professional regularly. Tell your doctor or healthcare professional if your symptoms do not start to get better or if they get worse. You may need blood work done while you are taking this medicine. You may need to follow a special diet. Talk to your doctor. Foods that contain iron include: whole grains/cereals, dried fruits, beans, or peas, leafy green vegetables, and organ meats (liver, kidney). What side effects may I notice from receiving this medicine? Side effects that you should report to your doctor or health care professional as soon as possible: -allergic reactions like skin rash, itching or hives, swelling of the face, lips, or tongue -breathing problems -changes in blood pressure -feeling faint or lightheaded, falls -fever or chills -flushing, sweating, or hot feelings -swelling of the ankles or feet Side effects that usually do not require medical attention (Report these to your doctor or health care professional if they continue or are bothersome.): -diarrhea -headache -nausea, vomiting -stomach pain This list may not describe all possible side effects. Call your doctor for medical advice about side effects. You may report side effects to FDA at 1-800-FDA-1088. Where should I keep my medicine? This drug is given in a hospital or clinic and will not be stored at home. NOTE: This sheet is a summary. It may not cover all possible information. If you have questions about this medicine, talk to your doctor, pharmacist, or health care  Jon.  2015, Elsevier/Gold Standard. (2011-09-11 15:23:36)  

## 2013-09-05 ENCOUNTER — Other Ambulatory Visit: Payer: Self-pay | Admitting: Family Medicine

## 2013-09-05 DIAGNOSIS — R921 Mammographic calcification found on diagnostic imaging of breast: Secondary | ICD-10-CM

## 2013-09-06 ENCOUNTER — Other Ambulatory Visit: Payer: Medicare Other

## 2013-09-07 ENCOUNTER — Other Ambulatory Visit (INDEPENDENT_AMBULATORY_CARE_PROVIDER_SITE_OTHER): Payer: Medicare Other

## 2013-09-07 DIAGNOSIS — D509 Iron deficiency anemia, unspecified: Secondary | ICD-10-CM

## 2013-09-07 DIAGNOSIS — I780 Hereditary hemorrhagic telangiectasia: Secondary | ICD-10-CM

## 2013-09-07 DIAGNOSIS — K31819 Angiodysplasia of stomach and duodenum without bleeding: Secondary | ICD-10-CM

## 2013-09-07 LAB — IRON AND TIBC
%SAT: 5 % — AB (ref 20–55)
IRON: 15 ug/dL — AB (ref 42–145)
TIBC: 280 ug/dL (ref 250–470)
UIBC: 265 ug/dL (ref 125–400)

## 2013-09-07 LAB — CBC WITH DIFFERENTIAL/PLATELET
BASOS ABS: 0 10*3/uL (ref 0.0–0.1)
Basophils Relative: 0 % (ref 0–1)
Eosinophils Absolute: 0.3 10*3/uL (ref 0.0–0.7)
Eosinophils Relative: 6 % — ABNORMAL HIGH (ref 0–5)
HCT: 25.1 % — ABNORMAL LOW (ref 36.0–46.0)
HEMOGLOBIN: 7.8 g/dL — AB (ref 12.0–15.0)
Lymphocytes Relative: 17 % (ref 12–46)
Lymphs Abs: 0.8 10*3/uL (ref 0.7–4.0)
MCH: 26.5 pg (ref 26.0–34.0)
MCHC: 31.1 g/dL (ref 30.0–36.0)
MCV: 85.4 fL (ref 78.0–100.0)
MONOS PCT: 8 % (ref 3–12)
Monocytes Absolute: 0.4 10*3/uL (ref 0.1–1.0)
Neutro Abs: 3.2 10*3/uL (ref 1.7–7.7)
Neutrophils Relative %: 69 % (ref 43–77)
Platelets: 158 10*3/uL (ref 150–400)
RBC: 2.94 MIL/uL — ABNORMAL LOW (ref 3.87–5.11)
RDW: 18.6 % — ABNORMAL HIGH (ref 11.5–15.5)
WBC: 4.6 10*3/uL (ref 4.0–10.5)

## 2013-09-07 LAB — FERRITIN: FERRITIN: 16 ng/mL (ref 10–291)

## 2013-09-08 ENCOUNTER — Encounter (HOSPITAL_COMMUNITY): Payer: Self-pay | Admitting: Emergency Medicine

## 2013-09-08 ENCOUNTER — Other Ambulatory Visit: Payer: Self-pay

## 2013-09-08 ENCOUNTER — Emergency Department (HOSPITAL_COMMUNITY)
Admission: EM | Admit: 2013-09-08 | Discharge: 2013-09-08 | Disposition: A | Discharge status: Home / Self Care | Payer: Medicare Other | Attending: Emergency Medicine | Admitting: Emergency Medicine

## 2013-09-08 ENCOUNTER — Telehealth: Payer: Self-pay | Admitting: *Deleted

## 2013-09-08 DIAGNOSIS — Z794 Long term (current) use of insulin: Secondary | ICD-10-CM | POA: Insufficient documentation

## 2013-09-08 DIAGNOSIS — R011 Cardiac murmur, unspecified: Secondary | ICD-10-CM | POA: Diagnosis not present

## 2013-09-08 DIAGNOSIS — Z872 Personal history of diseases of the skin and subcutaneous tissue: Secondary | ICD-10-CM | POA: Insufficient documentation

## 2013-09-08 DIAGNOSIS — R5383 Other fatigue: Secondary | ICD-10-CM

## 2013-09-08 DIAGNOSIS — R5381 Other malaise: Secondary | ICD-10-CM | POA: Diagnosis present

## 2013-09-08 DIAGNOSIS — Z8701 Personal history of pneumonia (recurrent): Secondary | ICD-10-CM | POA: Diagnosis not present

## 2013-09-08 DIAGNOSIS — Z79899 Other long term (current) drug therapy: Secondary | ICD-10-CM | POA: Diagnosis not present

## 2013-09-08 DIAGNOSIS — D649 Anemia, unspecified: Secondary | ICD-10-CM | POA: Insufficient documentation

## 2013-09-08 DIAGNOSIS — I509 Heart failure, unspecified: Secondary | ICD-10-CM | POA: Insufficient documentation

## 2013-09-08 DIAGNOSIS — Z87891 Personal history of nicotine dependence: Secondary | ICD-10-CM | POA: Insufficient documentation

## 2013-09-08 DIAGNOSIS — I10 Essential (primary) hypertension: Secondary | ICD-10-CM | POA: Diagnosis not present

## 2013-09-08 DIAGNOSIS — K219 Gastro-esophageal reflux disease without esophagitis: Secondary | ICD-10-CM | POA: Insufficient documentation

## 2013-09-08 DIAGNOSIS — E119 Type 2 diabetes mellitus without complications: Secondary | ICD-10-CM | POA: Diagnosis not present

## 2013-09-08 LAB — CBC WITH DIFFERENTIAL/PLATELET
BASOS PCT: 0 % (ref 0–1)
Basophils Absolute: 0 10*3/uL (ref 0.0–0.1)
EOS ABS: 0.2 10*3/uL (ref 0.0–0.7)
EOS PCT: 4 % (ref 0–5)
HEMATOCRIT: 28 % — AB (ref 36.0–46.0)
Hemoglobin: 8.8 g/dL — ABNORMAL LOW (ref 12.0–15.0)
Lymphocytes Relative: 14 % (ref 12–46)
Lymphs Abs: 0.7 10*3/uL (ref 0.7–4.0)
MCH: 26.1 pg (ref 26.0–34.0)
MCHC: 31.4 g/dL (ref 30.0–36.0)
MCV: 83.1 fL (ref 78.0–100.0)
MONO ABS: 0.5 10*3/uL (ref 0.1–1.0)
Monocytes Relative: 9 % (ref 3–12)
Neutro Abs: 3.7 10*3/uL (ref 1.7–7.7)
Neutrophils Relative %: 73 % (ref 43–77)
Platelets: 195 10*3/uL (ref 150–400)
RBC: 3.37 MIL/uL — ABNORMAL LOW (ref 3.87–5.11)
RDW: 18.7 % — AB (ref 11.5–15.5)
WBC: 5 10*3/uL (ref 4.0–10.5)

## 2013-09-08 LAB — URINALYSIS, ROUTINE W REFLEX MICROSCOPIC
Bilirubin Urine: NEGATIVE
Glucose, UA: 500 mg/dL — AB
Hgb urine dipstick: NEGATIVE
Ketones, ur: NEGATIVE mg/dL
Leukocytes, UA: NEGATIVE
Nitrite: NEGATIVE
Protein, ur: NEGATIVE mg/dL
Specific Gravity, Urine: 1.012 (ref 1.005–1.030)
Urobilinogen, UA: 0.2 mg/dL (ref 0.0–1.0)
pH: 6 (ref 5.0–8.0)

## 2013-09-08 LAB — COMPREHENSIVE METABOLIC PANEL
ALBUMIN: 3.7 g/dL (ref 3.5–5.2)
ALT: 44 U/L — ABNORMAL HIGH (ref 0–35)
AST: 45 U/L — AB (ref 0–37)
Alkaline Phosphatase: 118 U/L — ABNORMAL HIGH (ref 39–117)
Anion gap: 12 (ref 5–15)
BUN: 16 mg/dL (ref 6–23)
CALCIUM: 9.4 mg/dL (ref 8.4–10.5)
CO2: 25 mEq/L (ref 19–32)
CREATININE: 0.7 mg/dL (ref 0.50–1.10)
Chloride: 104 mEq/L (ref 96–112)
GFR calc Af Amer: >90 mL/min (ref >90)
GFR, EST NON AFRICAN AMERICAN: 84 mL/min — AB (ref >90)
Glucose, Bld: 231 mg/dL — ABNORMAL HIGH (ref 70–99)
Potassium: 4 mEq/L (ref 3.7–5.3)
Sodium: 141 mEq/L (ref 137–147)
Total Bilirubin: 0.3 mg/dL (ref 0.3–1.2)
Total Protein: 7 g/dL (ref 6.0–8.3)

## 2013-09-08 NOTE — ED Provider Notes (Signed)
CSN: [id number]     Arrival date & time 09/08/13  1401 History   First MD Initiated Contact with Patient 09/08/13 1702     Chief Complaint  Patient presents with  . Fatigue     (Consider location/radiation/quality/duration/timing/severity/associated sxs/prior Treatment) HPI Patient presents to the emergency department with generalized fatigue over the last week.  Patient, states she has a history of anemia and this feels similar to previous episodes were hemoglobin has been low.  Patient, states she had a hemoglobin 7.8 yesterday.  Patient denies chest pain, shortness of breath, headache, blurred vision, back pain, weakness, fever, cough, dysuria, increased urination, increased thirst rash or syncope.  Patient, states, that nothing seems make her condition, better or worse.  Patient, states, that activity seems to make her more fatigued.  Patient did not take any medications prior to arrival Past Medical History  Diagnosis Date  . Anemia   . S/P endoscopy     with laser treatment  08/19/2006  . History of epistaxis   . Hypertension   . Hyperlipidemia   . Telangiectasia     Gastric   . Lichen planus     Both lower extremities  . GERD (gastroesophageal reflux disease)   . Heart murmur   . HTN (hypertension), benign 03/02/2012  . Family history of anesthesia complication     "niece has a hard time coming out" (09/15/2012)  . CHF (congestive heart failure)   . Pneumonia 1990's    "twice" (09/15/2012)  . Type II diabetes mellitus     insulin requiring.  Marland Kitchen History of blood transfusion     "getting 2 today; have had transfusions before" (09/15/2012)   Past Surgical History  Procedure Laterality Date  . Nose surgery      "for bleeding" (09/15/2012)  . Savory dilation  02/26/2011    Procedure: SAVORY DILATION;  Surgeon: [name], MD;  Location: [name];  Service: Endoscopy;  Laterality: N/A;  c-arm needed  . Esophagogastroduodenoscopy  02/26/2011    Procedure:  ESOPHAGOGASTRODUODENOSCOPY (EGD);  Surgeon: [name], MD;  Location: [name];  Service: Endoscopy;  Laterality: N/A;  . Esophagogastroduodenoscopy N/A 11/08/2012    Procedure: ESOPHAGOGASTRODUODENOSCOPY (EGD);  Surgeon: [name], MD;  Location: [name];  Service: Endoscopy;  Laterality: N/A;   Family History  Problem Relation Age of Onset  . Breast cancer    . Malignant hyperthermia Neg Hx   . Stroke Father    History  Substance Use Topics  . Smoking status: Former Smoker -- 2 years    Types: Cigarettes  . Smokeless tobacco: Never Used     Comment: 09/15/2012 "smoked 50-60 yr ago"  . Alcohol Use: No   OB History   Grav Para Term Preterm Abortions TAB SAB Ect Mult Living                 Review of Systems  All other systems negative except as documented in the HPI. All pertinent positives and negatives as reviewed in the HPI.  Allergies  Aspirin  Home Medications   Prior to Admission medications   Medication Sig Start Date End Date Taking? Authorizing Provider  enalapril (VASOTEC) 20 MG tablet Take 20 mg by mouth daily.   Yes Historical Provider, MD  Insulin Detemir (LEVEMIR FLEXPEN) 100 UNIT/ML SOPN Inject 45 Units into the skin at bedtime.   Yes Historical Provider, MD  pantoprazole (PROTONIX) 40 MG tablet Take 40 mg by mouth daily as needed. For heartburn  Yes Historical Provider, MD  sodium chloride (OCEAN) 0.65 % nasal spray Place 1 spray into the nose as needed. For nasal congestion   Yes Historical Provider, MD  vitamin B-12 (CYANOCOBALAMIN) 1000 MCG tablet Take 1,000 mcg by mouth at bedtime.    Yes Historical Provider, MD  Vitamin D, Ergocalciferol, (DRISDOL) 50000 UNITS CAPS Take 50,000 Units by mouth every 7 (seven) days. Takes on Monday   Yes Historical Provider, MD   BP 161/56  Pulse 65  Temp(Src) 98 F (36.7 C) (Oral)  Resp 16  SpO2 99% Physical Exam  Nursing note and vitals reviewed. Constitutional: She is oriented to person, place, and  time. She appears well-developed and well-nourished. No distress.  HENT:  Head: Normocephalic and atraumatic.  Mouth/Throat: Oropharynx is clear and moist.  Eyes: Pupils are equal, round, and reactive to light.  Neck: Normal range of motion. Neck supple.  Cardiovascular: Normal rate, regular rhythm and normal heart sounds.  Exam reveals no gallop and no friction rub.   No murmur heard. Pulmonary/Chest: Effort normal and breath sounds normal. No respiratory distress.  Abdominal: Soft. Bowel sounds are normal. She exhibits no distension. There is no tenderness.  Musculoskeletal: She exhibits no edema.  Neurological: She is alert and oriented to person, place, and time. She exhibits normal muscle tone. Coordination normal.  Skin: Skin is warm and dry.    ED Course  Procedures (including critical care time) Labs Review Labs Reviewed  CBC WITH DIFFERENTIAL - Abnormal; Notable for the following:    RBC 3.37 (*)    Hemoglobin 8.8 (*)    HCT 28.0 (*)    RDW 18.7 (*)    All other components within normal limits  URINALYSIS, ROUTINE W REFLEX MICROSCOPIC - Abnormal; Notable for the following:    Glucose, UA 500 (*)    All other components within normal limits  URINE CULTURE  COMPREHENSIVE METABOLIC PANEL  TYPE AND SCREEN    Patient's hemoglobin has been stable at 8.8 compared 7.8 yesterday.  Patient is advised to follow up with her primary care Dr. told to return here as needed.  Patient has been advised results, and all questions were answered.   Brent General, PA-C 09/08/13 2014

## 2013-09-08 NOTE — Discharge Instructions (Signed)
Return here as needed.  Followup with your primary care Dr. testing here today is stable, and her hemoglobin is 8.8

## 2013-09-08 NOTE — ED Provider Notes (Signed)
Medical screening examination/treatment/procedure(s) were conducted as a shared visit with non-physician practitioner(s) and myself.  I personally evaluated the patient during the encounter.  EKG interpretation in [place].     Dorie Rank, MD 09/08/13 2016

## 2013-09-08 NOTE — Telephone Encounter (Signed)
Only message on ID triage recording was phone number. Called pt and states she is at [facility] parking lot - problems with walking, feels weak and dizzy. Checking on labs from 09/07/13. Dr [name] is gone till  09/12/13. Has new PCP - informed pt recently - iron is low. Suggest for pt to go to ER due to not feeling well. Husband is driving and suggest to drive to entrance ER.  Hilda Blades Zachary Nole RN 09/08/13 2PM

## 2013-09-08 NOTE — ED Notes (Signed)
Pt sts she took 45 units of Levamir in the bathroom upon hearing CBG was 373.  PA made aware.

## 2013-09-08 NOTE — ED Notes (Signed)
Pt in c/o generalized fatigue over the last week, states she has a history of anemia and this feels the same, hgb 7.8 yesterday when checked, denies pain

## 2013-09-09 LAB — TYPE AND SCREEN
ABO/RH(D): A POS
ANTIBODY SCREEN: NEGATIVE

## 2013-09-10 LAB — URINE CULTURE: Colony Count: 9000

## 2013-09-11 ENCOUNTER — Encounter (INDEPENDENT_AMBULATORY_CARE_PROVIDER_SITE_OTHER): Payer: Self-pay

## 2013-09-11 ENCOUNTER — Ambulatory Visit
Admission: RE | Admit: 2013-09-11 | Discharge: 2013-09-11 | Disposition: A | Discharge status: Home / Self Care | Payer: Medicare Other | Source: Ambulatory Visit | Attending: Family Medicine | Admitting: Family Medicine

## 2013-09-11 ENCOUNTER — Other Ambulatory Visit: Payer: Self-pay | Admitting: Oncology

## 2013-09-11 DIAGNOSIS — K31811 Angiodysplasia of stomach and duodenum with bleeding: Secondary | ICD-10-CM

## 2013-09-11 DIAGNOSIS — I780 Hereditary hemorrhagic telangiectasia: Secondary | ICD-10-CM

## 2013-09-11 DIAGNOSIS — D509 Iron deficiency anemia, unspecified: Secondary | ICD-10-CM

## 2013-09-11 DIAGNOSIS — R921 Mammographic calcification found on diagnostic imaging of breast: Secondary | ICD-10-CM

## 2013-09-12 ENCOUNTER — Other Ambulatory Visit (INDEPENDENT_AMBULATORY_CARE_PROVIDER_SITE_OTHER): Payer: Medicare Other

## 2013-09-12 ENCOUNTER — Other Ambulatory Visit: Payer: Self-pay | Admitting: Oncology

## 2013-09-12 ENCOUNTER — Ambulatory Visit (HOSPITAL_BASED_OUTPATIENT_CLINIC_OR_DEPARTMENT_OTHER): Payer: Medicare Other

## 2013-09-12 ENCOUNTER — Ambulatory Visit (HOSPITAL_COMMUNITY)
Admission: RE | Admit: 2013-09-12 | Discharge: 2013-09-12 | Disposition: A | Discharge status: Home / Self Care | Payer: Medicare Other | Source: Ambulatory Visit | Attending: Oncology | Admitting: Oncology

## 2013-09-12 ENCOUNTER — Other Ambulatory Visit: Payer: Medicare Other

## 2013-09-12 VITALS — BP 128/66 | HR 67 | Temp 98.3°F | Resp 18

## 2013-09-12 DIAGNOSIS — D500 Iron deficiency anemia secondary to blood loss (chronic): Secondary | ICD-10-CM | POA: Diagnosis not present

## 2013-09-12 DIAGNOSIS — K31811 Angiodysplasia of stomach and duodenum with bleeding: Secondary | ICD-10-CM

## 2013-09-12 DIAGNOSIS — D509 Iron deficiency anemia, unspecified: Secondary | ICD-10-CM

## 2013-09-12 DIAGNOSIS — R58 Hemorrhage, not elsewhere classified: Secondary | ICD-10-CM | POA: Insufficient documentation

## 2013-09-12 DIAGNOSIS — I780 Hereditary hemorrhagic telangiectasia: Secondary | ICD-10-CM

## 2013-09-12 DIAGNOSIS — Q2733 Arteriovenous malformation of digestive system vessel: Secondary | ICD-10-CM | POA: Diagnosis not present

## 2013-09-12 DIAGNOSIS — D62 Acute posthemorrhagic anemia: Secondary | ICD-10-CM | POA: Diagnosis not present

## 2013-09-12 DIAGNOSIS — D649 Anemia, unspecified: Secondary | ICD-10-CM

## 2013-09-12 LAB — CBC WITH DIFFERENTIAL/PLATELET
BASOS PCT: 0 % (ref 0–1)
Basophils Absolute: 0 10*3/uL (ref 0.0–0.1)
Eosinophils Absolute: 0.4 10*3/uL (ref 0.0–0.7)
Eosinophils Relative: 8 % — ABNORMAL HIGH (ref 0–5)
HCT: 24.6 % — ABNORMAL LOW (ref 36.0–46.0)
Hemoglobin: 7.5 g/dL — ABNORMAL LOW (ref 12.0–15.0)
LYMPHS PCT: 16 % (ref 12–46)
Lymphs Abs: 0.7 10*3/uL (ref 0.7–4.0)
MCH: 25.2 pg — ABNORMAL LOW (ref 26.0–34.0)
MCHC: 30.5 g/dL (ref 30.0–36.0)
MCV: 82.6 fL (ref 78.0–100.0)
MONOS PCT: 10 % (ref 3–12)
Monocytes Absolute: 0.5 10*3/uL (ref 0.1–1.0)
NEUTROS PCT: 66 % (ref 43–77)
Neutro Abs: 3 10*3/uL (ref 1.7–7.7)
Platelets: 191 10*3/uL (ref 150–400)
RBC: 2.98 MIL/uL — ABNORMAL LOW (ref 3.87–5.11)
RDW: 18.3 % — AB (ref 11.5–15.5)
WBC: 4.5 10*3/uL (ref 4.0–10.5)

## 2013-09-12 LAB — PREPARE RBC (CROSSMATCH)

## 2013-09-12 LAB — FERRITIN: FERRITIN: 12 ng/mL (ref 10–291)

## 2013-09-12 MED ORDER — SODIUM CHLORIDE 0.9 % IV SOLN
Freq: Once | INTRAVENOUS | Status: AC
  Administered 2013-09-12: 14:00:00 via INTRAVENOUS

## 2013-09-12 MED ORDER — SODIUM CHLORIDE 0.9 % IV SOLN
250.0000 mL | Freq: Once | INTRAVENOUS | Status: DC
  Filled 2013-09-12: qty 250

## 2013-09-12 NOTE — Progress Notes (Signed)
Patient completed 1 unit PRBCs without difficulty. Will return tomorrow for 2nd unit, no premeds ordered. Patient instructed to keep blue bracelet and voices understanding.

## 2013-09-12 NOTE — Patient Instructions (Signed)

## 2013-09-13 ENCOUNTER — Ambulatory Visit (HOSPITAL_BASED_OUTPATIENT_CLINIC_OR_DEPARTMENT_OTHER): Payer: Medicare Other

## 2013-09-13 VITALS — BP 136/43 | HR 63 | Temp 97.9°F | Resp 14

## 2013-09-13 DIAGNOSIS — D649 Anemia, unspecified: Secondary | ICD-10-CM

## 2013-09-13 DIAGNOSIS — D500 Iron deficiency anemia secondary to blood loss (chronic): Secondary | ICD-10-CM | POA: Diagnosis not present

## 2013-09-13 MED ORDER — SODIUM CHLORIDE 0.9 % IV SOLN
250.0000 mL | Freq: Once | INTRAVENOUS | Status: AC
  Administered 2013-09-13: 250 mL via INTRAVENOUS

## 2013-09-13 NOTE — Patient Instructions (Signed)

## 2013-09-14 LAB — TYPE AND SCREEN
ABO/RH(D): A POS
ANTIBODY SCREEN: NEGATIVE
UNIT DIVISION: 0
Unit division: 0

## 2013-09-26 ENCOUNTER — Other Ambulatory Visit: Payer: Self-pay | Admitting: Oncology

## 2013-09-26 ENCOUNTER — Ambulatory Visit (INDEPENDENT_AMBULATORY_CARE_PROVIDER_SITE_OTHER): Payer: Medicare Other | Admitting: Oncology

## 2013-09-26 ENCOUNTER — Ambulatory Visit (HOSPITAL_BASED_OUTPATIENT_CLINIC_OR_DEPARTMENT_OTHER): Payer: Medicare Other

## 2013-09-26 ENCOUNTER — Encounter: Payer: Self-pay | Admitting: Oncology

## 2013-09-26 ENCOUNTER — Ambulatory Visit: Payer: Medicare Other

## 2013-09-26 ENCOUNTER — Other Ambulatory Visit: Payer: Self-pay | Admitting: *Deleted

## 2013-09-26 VITALS — BP 126/57 | HR 73 | Temp 97.6°F | Ht 63.0 in | Wt 155.1 lb

## 2013-09-26 VITALS — BP 130/39 | HR 58 | Temp 98.2°F | Resp 16

## 2013-09-26 DIAGNOSIS — D500 Iron deficiency anemia secondary to blood loss (chronic): Secondary | ICD-10-CM

## 2013-09-26 DIAGNOSIS — D509 Iron deficiency anemia, unspecified: Secondary | ICD-10-CM

## 2013-09-26 DIAGNOSIS — I780 Hereditary hemorrhagic telangiectasia: Secondary | ICD-10-CM

## 2013-09-26 DIAGNOSIS — K31811 Angiodysplasia of stomach and duodenum with bleeding: Secondary | ICD-10-CM

## 2013-09-26 DIAGNOSIS — D62 Acute posthemorrhagic anemia: Secondary | ICD-10-CM

## 2013-09-26 DIAGNOSIS — K31819 Angiodysplasia of stomach and duodenum without bleeding: Secondary | ICD-10-CM

## 2013-09-26 DIAGNOSIS — Q2733 Arteriovenous malformation of digestive system vessel: Secondary | ICD-10-CM

## 2013-09-26 LAB — CBC WITH DIFFERENTIAL/PLATELET
BASOS ABS: 0 10*3/uL (ref 0.0–0.1)
Basophils Relative: 0 % (ref 0–1)
Eosinophils Absolute: 0.2 10*3/uL (ref 0.0–0.7)
Eosinophils Relative: 5 % (ref 0–5)
HCT: 23.9 % — ABNORMAL LOW (ref 36.0–46.0)
Hemoglobin: 7.5 g/dL — ABNORMAL LOW (ref 12.0–15.0)
Lymphocytes Relative: 14 % (ref 12–46)
Lymphs Abs: 0.6 10*3/uL — ABNORMAL LOW (ref 0.7–4.0)
MCH: 25.7 pg — AB (ref 26.0–34.0)
MCHC: 31.4 g/dL (ref 30.0–36.0)
MCV: 81.8 fL (ref 78.0–100.0)
Monocytes Absolute: 0.4 10*3/uL (ref 0.1–1.0)
Monocytes Relative: 10 % (ref 3–12)
Neutro Abs: 3.1 10*3/uL (ref 1.7–7.7)
Neutrophils Relative %: 71 % (ref 43–77)
RBC: 2.92 MIL/uL — ABNORMAL LOW (ref 3.87–5.11)
RDW: 16.8 % — AB (ref 11.5–15.5)
WBC: 4.4 10*3/uL (ref 4.0–10.5)

## 2013-09-26 LAB — PREPARE RBC (CROSSMATCH)

## 2013-09-26 MED ORDER — FERUMOXYTOL INJECTION 510 MG/17 ML
1020.0000 mg | Freq: Once | INTRAVENOUS | Status: AC
  Administered 2013-09-26: 1020 mg via INTRAVENOUS
  Filled 2013-09-26: qty 34

## 2013-09-26 MED ORDER — SODIUM CHLORIDE 0.9 % IJ SOLN
10.0000 mL | INTRAMUSCULAR | Status: DC | PRN
  Filled 2013-09-26: qty 10

## 2013-09-26 MED ORDER — SODIUM CHLORIDE 0.9 % IV SOLN
250.0000 mL | Freq: Once | INTRAVENOUS | Status: AC
  Administered 2013-09-26: 250 mL via INTRAVENOUS

## 2013-09-26 NOTE — Patient Instructions (Signed)
Go to [facility] today at 1:30 for an iron infusion If you need blood, we will contact you Continue to check a blood count every month at [facility] Return Visit with Dr Darnell Level at [place] 8:45  11/16  Lab same day

## 2013-09-26 NOTE — Patient Instructions (Signed)
Ferumoxytol injection What is this medicine? FERUMOXYTOL is an iron complex. Iron is used to make healthy red blood cells, which carry oxygen and nutrients throughout the body. This medicine is used to treat iron deficiency anemia in people with chronic kidney disease. This medicine may be used for other purposes; ask your health care provider or pharmacist if you have questions. COMMON BRAND NAME(S): Feraheme What should I tell my health care provider before I take this medicine? They need to know if you have any of these conditions: -anemia not caused by low iron levels -high levels of iron in the blood -magnetic resonance imaging (MRI) test scheduled -an unusual or allergic reaction to iron, other medicines, foods, dyes, or preservatives -pregnant or trying to get pregnant -breast-feeding How should I use this medicine? This medicine is for injection into a vein. It is given by a health care professional in a hospital or clinic setting. Talk to your pediatrician regarding the use of this medicine in children. Special care may be needed. Overdosage: If you think you've taken too much of this medicine contact a poison control center or emergency room at once. Overdosage: If you think you have taken too much of this medicine contact a poison control center or emergency room at once. NOTE: This medicine is only for you. Do not share this medicine with others. What if I miss a dose? It is important not to miss your dose. Call your doctor or health care professional if you are unable to keep an appointment. What may interact with this medicine? This medicine may interact with the following medications: -other iron products This list may not describe all possible interactions. Give your health care provider a list of all the medicines, herbs, non-prescription drugs, or dietary supplements you use. Also tell them if you smoke, drink alcohol, or use illegal drugs. Some items may interact with your  medicine. What should I watch for while using this medicine? Visit your doctor or healthcare professional regularly. Tell your doctor or healthcare professional if your symptoms do not start to get better or if they get worse. You may need blood work done while you are taking this medicine. You may need to follow a special diet. Talk to your doctor. Foods that contain iron include: whole grains/cereals, dried fruits, beans, or peas, leafy green vegetables, and organ meats (liver, kidney). What side effects may I notice from receiving this medicine? Side effects that you should report to your doctor or health care professional as soon as possible: -allergic reactions like skin rash, itching or hives, swelling of the face, lips, or tongue -breathing problems -changes in blood pressure -feeling faint or lightheaded, falls -fever or chills -flushing, sweating, or hot feelings -swelling of the ankles or feet Side effects that usually do not require medical attention (Report these to your doctor or health care professional if they continue or are bothersome.): -diarrhea -headache -nausea, vomiting -stomach pain This list may not describe all possible side effects. Call your doctor for medical advice about side effects. You may report side effects to FDA at 1-800-FDA-1088. Where should I keep my medicine? This drug is given in a hospital or clinic and will not be stored at home. NOTE: This sheet is a summary. It may not cover all possible information. If you have questions about this medicine, talk to your doctor, pharmacist, or health care provider.  2015, Elsevier/Gold Standard. (2011-09-11 15:23:36)  Blood Transfusion Information WHAT IS A BLOOD TRANSFUSION? A transfusion is the replacement   of blood or some of its parts. Blood is made up of multiple cells which provide different functions.  Red blood cells carry oxygen and are used for blood loss replacement.  White blood cells fight  against infection.  Platelets control bleeding.  Plasma helps clot blood.  Other blood products are available for specialized needs, such as hemophilia or other clotting disorders. BEFORE THE TRANSFUSION  Who gives blood for transfusions?   You may be able to donate blood to be used at a later date on yourself (autologous donation).  Relatives can be asked to donate blood. This is generally not any safer than if you have received blood from a stranger. The same precautions are taken to ensure safety when a relative's blood is donated.  Healthy volunteers who are fully evaluated to make sure their blood is safe. This is blood bank blood. Transfusion therapy is the safest it has ever been in the practice of medicine. Before blood is taken from a donor, a complete history is taken to make sure that person has no history of diseases nor engages in risky social behavior (examples are intravenous drug use or sexual activity with multiple partners). The donor's travel history is screened to minimize risk of transmitting infections, such as malaria. The donated blood is tested for signs of infectious diseases, such as HIV and hepatitis. The blood is then tested to be sure it is compatible with you in order to minimize the chance of a transfusion reaction. If you or a relative donates blood, this is often done in anticipation of surgery and is not appropriate for emergency situations. It takes many days to process the donated blood. RISKS AND COMPLICATIONS Although transfusion therapy is very safe and saves many lives, the main dangers of transfusion include:   Getting an infectious disease.  Developing a transfusion reaction. This is an allergic reaction to something in the blood you were given. Every precaution is taken to prevent this. The decision to have a blood transfusion has been considered carefully by your caregiver before blood is given. Blood is not given unless the benefits outweigh the  risks. AFTER THE TRANSFUSION  Right after receiving a blood transfusion, you will usually feel much better and more energetic. This is especially true if your red blood cells have gotten low (anemic). The transfusion raises the level of the red blood cells which carry oxygen, and this usually causes an energy increase.  The nurse administering the transfusion will monitor you carefully for complications. HOME CARE INSTRUCTIONS  No special instructions are needed after a transfusion. You may find your energy is better. Speak with your caregiver about any limitations on activity for underlying diseases you may have. SEEK MEDICAL CARE IF:   Your condition is not improving after your transfusion.  You develop redness or irritation at the intravenous (IV) site. SEEK IMMEDIATE MEDICAL CARE IF:  Any of the following symptoms occur over the next 12 hours:  Shaking chills.  You have a temperature by mouth above 102 F (38.9 C), not controlled by medicine.  Chest, back, or muscle pain.  People around you feel you are not acting correctly or are confused.  Shortness of breath or difficulty breathing.  Dizziness and fainting.  You get a rash or develop hives.  You have a decrease in urine output.  Your urine turns a dark color or changes to pink, red, or brown. Any of the following symptoms occur over the next 10 days:  You have a temperature   by mouth above 102 F (38.9 C), not controlled by medicine.  Shortness of breath.  Weakness after normal activity.  The white part of the eye turns yellow (jaundice).  You have a decrease in the amount of urine or are urinating less often.  Your urine turns a dark color or changes to pink, red, or brown. Document Released: 01/24/2000 Document Revised: 04/20/2011 Document Reviewed: 09/12/2007 ExitCare Patient Information 2015 [employer]. This information is not intended to replace advice given to you by your health care provider. Make  sure you discuss any questions you have with your health care provider.   

## 2013-09-26 NOTE — Progress Notes (Signed)
Patient ID: [name], female   DOB: 1940-08-31, 73 y.o.   MRN: [id number] Hematology and Oncology Follow Up Visit  WONDER LAVECK [id number] [birth year] 73 y.o. 09/26/2013 7:47 PM   Principle Diagnosis: Encounter Diagnoses  Name Primary?  . Osler-Weber-Rendu disease Yes  . Gastric AVM   . ANEMIA, IRON DEFICIENCY NOS   . 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 She requires frequent transfusions and parenteral iron supplementation. She does not tolerate oral iron. Transfusion requirements are becoming more frequent. She just received blood on August 4 and August 5 of this month when hemoglobin fell to 7.5. Hemoglobin today is back down to 7.5. She complains of dyspnea with mild exertion but denies any chest pain or pressure.  Medications: reviewed  Allergies:  Allergies  Allergen Reactions  . Aspirin Nausea And Vomiting    Review of Systems: See history of present illness. Dermatological ROS: Chronic, extensive, pruritic, rash of her lower extremities Remaining ROS negative:   Physical Exam: Blood pressure 126/57, pulse 73, temperature 97.6 F (36.4 C), temperature source Oral, height 5\' 3"  (1.6 m), weight 155 lb 1.6 oz (70.353 kg), SpO2 98.00%. Wt Readings from Last 3 Encounters:  09/26/13 155 lb 1.6 oz (70.353 kg)  06/07/13 152 lb (68.947 kg)  06/06/13 152 lb 12.8 oz (69.31 kg)     General appearance: Well-nourished African American woman HENNT: Pharynx no erythema, exudate, mass, or ulcer. No thyromegaly or thyroid nodules Lymph nodes: No cervical, supraclavicular, or axillary lymphadenopathy Breasts: Lungs: Clear to auscultation, resonant to percussion throughout Heart: Regular rhythm, [id number] aortic systolic murmur, no gallop, no rub, no click, no edema Abdomen: Soft,  nontender, normal bowel sounds, no mass, no organomegaly Extremities: No edema, no calf tenderness Musculoskeletal: no joint deformities GU:  Vascular: Carotid pulses 2+, no bruits, distal pulses: Dorsalis pedis 1+ symmetric Neurologic: Alert, oriented, PERRLA, optic discs sharp and vessels normal, no hemorrhage or exudate, cranial nerves grossly normal, motor strength 5 over 5, reflexes 1+ symmetric, upper body coordination normal, gait normal, Skin: Extensive raised, serpiginous, rash on both of her lower legs. Punctate angiomas on a few of her fingertips.  Lab Results: CBC W/Diff    Component Value Date/Time   WBC 4.4 09/26/2013 0909   WBC 4.7 04/27/2013 0949   RBC 2.92* 09/26/2013 0909   RBC 2.97* 04/27/2013 0949   RBC 3.19* 02/01/2011 0839   HGB 7.5* 09/26/2013 0909   HGB 6.3* 04/27/2013 0949   HCT 23.9* 09/26/2013 0909   HCT 20.6* 04/27/2013 0949   PLT NOTE 09/26/2013 0909   PLT 195 04/27/2013 0949   MCV 81.8 09/26/2013 0909   MCV 69.3* 04/27/2013 0949   MCH 25.7* 09/26/2013 0909   MCH 21.1* 04/27/2013 0949   MCHC 31.4 09/26/2013 0909   MCHC 30.4* 04/27/2013 0949   RDW 16.8* 09/26/2013 0909   RDW 21.4* 04/27/2013 0949   LYMPHSABS 0.6* 09/26/2013 0909   LYMPHSABS 0.7* 04/27/2013 0949   MONOABS 0.4 09/26/2013 0909   MONOABS 0.5 04/27/2013 0949   EOSABS 0.2 09/26/2013 0909   EOSABS 0.4 04/27/2013 0949   BASOSABS 0.0 09/26/2013 0909   BASOSABS 0.0 04/27/2013 0949     Chemistry      Component Value Date/Time   NA 141 09/08/2013 1844   NA 142 12/28/2011 0825   K 4.0  09/08/2013 1844   K 3.9 12/28/2011 0825   CL 104 09/08/2013 1844   CL 111* 12/28/2011 0825   CO2 25 09/08/2013 1844   CO2 26 12/28/2011 0825   BUN 16 09/08/2013 1844   BUN 18.0 12/28/2011 0825   CREATININE 0.70 09/08/2013 1844   CREATININE 0.76 06/06/2013 0947   CREATININE 0.7 12/28/2011 0825      Component Value Date/Time   CALCIUM 9.4 09/08/2013 1844   CALCIUM 9.6 12/28/2011 0825   ALKPHOS 118* 09/08/2013 1844   ALKPHOS 93  12/28/2011 0825   AST 45* 09/08/2013 1844   AST 42* 12/28/2011 0825   ALT 44* 09/08/2013 1844   ALT 37 12/28/2011 0825   BILITOT 0.3 09/08/2013 1844   BILITOT 0.37 12/28/2011 0825       Radiological Studies: Mm Digital Diagnostic Unilat R  09/11/2013   CLINICAL DATA:  Six month re-evaluation probably benign right breast calcifications.  EXAM: DIGITAL DIAGNOSTIC  right breast MAMMOGRAM WITH CAD  COMPARISON:  03/17/2013, 03/06/2013, 04/16/2011, 04/03/2011, 09/26/2010.  ACR Breast Density Category b: There are scattered areas of fibroglandular density.  FINDINGS: There are relatively coarse calcifications located superiorly and centrally within the right breast which have developed and coalesced over time most consistent with evolving fibroadenomatoid calcifications. There are no worrisome developing calcifications. There is no mass or distortion.  Mammographic images were processed with CAD.  IMPRESSION: Findings are consistent with evolving fibroadenomatoid calcifications. I recommend bilateral diagnostic mammography in 6 months.  RECOMMENDATION: Bilateral diagnostic mammography in 6 months.  I have discussed the findings and recommendations with the patient. Results were also provided in writing at the conclusion of the visit. If applicable, a reminder letter will be sent to the patient regarding the next appointment.  BI-RADS CATEGORY  3: Probably benign.   Electronically Signed   By: Luberta Robertson M.D.   On: 09/11/2013 16:14    Impression:  #1. HHT #2. Chronic GI blood loss secondary to #1 #3. Extensive lichen planus rash lower extremities #4. Aortic stenosis #5. Essential hypertension. #6. Type 2 diabetes now insulin-dependent  Plan: I have arranged for blood transfusion and iron infusion today at the [facility]. We will continue to monitor her blood counts frequently.   CC: Patient Care Team: Philis Fendt, MD as PCP - General (Internal Medicine)   [name], MD 8/18/20157:47 PM

## 2013-09-27 ENCOUNTER — Ambulatory Visit (HOSPITAL_BASED_OUTPATIENT_CLINIC_OR_DEPARTMENT_OTHER): Payer: Medicare Other

## 2013-09-27 VITALS — BP 175/48 | HR 56 | Temp 98.5°F | Resp 16

## 2013-09-27 DIAGNOSIS — D500 Iron deficiency anemia secondary to blood loss (chronic): Secondary | ICD-10-CM | POA: Diagnosis not present

## 2013-09-27 DIAGNOSIS — D649 Anemia, unspecified: Secondary | ICD-10-CM

## 2013-09-27 MED ORDER — SODIUM CHLORIDE 0.9 % IV SOLN
250.0000 mL | Freq: Once | INTRAVENOUS | Status: AC
  Administered 2013-09-27: 250 mL via INTRAVENOUS

## 2013-09-27 NOTE — Patient Instructions (Signed)

## 2013-09-28 LAB — TYPE AND SCREEN
ABO/RH(D): A POS
Antibody Screen: NEGATIVE
UNIT DIVISION: 0
Unit division: 0

## 2013-10-03 ENCOUNTER — Inpatient Hospital Stay (HOSPITAL_COMMUNITY)
Admission: EM | Admit: 2013-10-03 | Discharge: 2013-10-05 | DRG: 377 | Disposition: A | Discharge status: Home / Self Care | Payer: Medicare Other | Attending: Internal Medicine | Admitting: Internal Medicine

## 2013-10-03 ENCOUNTER — Encounter (HOSPITAL_COMMUNITY): Payer: Self-pay | Admitting: Emergency Medicine

## 2013-10-03 ENCOUNTER — Emergency Department (HOSPITAL_COMMUNITY): Payer: Medicare Other

## 2013-10-03 DIAGNOSIS — R0609 Other forms of dyspnea: Secondary | ICD-10-CM

## 2013-10-03 DIAGNOSIS — I780 Hereditary hemorrhagic telangiectasia: Secondary | ICD-10-CM | POA: Diagnosis present

## 2013-10-03 DIAGNOSIS — Z87891 Personal history of nicotine dependence: Secondary | ICD-10-CM | POA: Diagnosis not present

## 2013-10-03 DIAGNOSIS — E785 Hyperlipidemia, unspecified: Secondary | ICD-10-CM

## 2013-10-03 DIAGNOSIS — I5033 Acute on chronic diastolic (congestive) heart failure: Secondary | ICD-10-CM

## 2013-10-03 DIAGNOSIS — Z794 Long term (current) use of insulin: Secondary | ICD-10-CM | POA: Diagnosis not present

## 2013-10-03 DIAGNOSIS — K31819 Angiodysplasia of stomach and duodenum without bleeding: Secondary | ICD-10-CM

## 2013-10-03 DIAGNOSIS — E1165 Type 2 diabetes mellitus with hyperglycemia: Secondary | ICD-10-CM

## 2013-10-03 DIAGNOSIS — IMO0001 Reserved for inherently not codable concepts without codable children: Secondary | ICD-10-CM | POA: Diagnosis present

## 2013-10-03 DIAGNOSIS — D500 Iron deficiency anemia secondary to blood loss (chronic): Secondary | ICD-10-CM

## 2013-10-03 DIAGNOSIS — K31811 Angiodysplasia of stomach and duodenum with bleeding: Principal | ICD-10-CM

## 2013-10-03 DIAGNOSIS — I10 Essential (primary) hypertension: Secondary | ICD-10-CM | POA: Diagnosis present

## 2013-10-03 DIAGNOSIS — I5032 Chronic diastolic (congestive) heart failure: Secondary | ICD-10-CM

## 2013-10-03 DIAGNOSIS — E876 Hypokalemia: Secondary | ICD-10-CM

## 2013-10-03 DIAGNOSIS — E118 Type 2 diabetes mellitus with unspecified complications: Secondary | ICD-10-CM

## 2013-10-03 DIAGNOSIS — K922 Gastrointestinal hemorrhage, unspecified: Secondary | ICD-10-CM | POA: Diagnosis present

## 2013-10-03 DIAGNOSIS — D649 Anemia, unspecified: Secondary | ICD-10-CM

## 2013-10-03 DIAGNOSIS — R040 Epistaxis: Secondary | ICD-10-CM

## 2013-10-03 DIAGNOSIS — R0989 Other specified symptoms and signs involving the circulatory and respiratory systems: Secondary | ICD-10-CM

## 2013-10-03 DIAGNOSIS — I509 Heart failure, unspecified: Secondary | ICD-10-CM | POA: Diagnosis present

## 2013-10-03 DIAGNOSIS — D538 Other specified nutritional anemias: Secondary | ICD-10-CM

## 2013-10-03 DIAGNOSIS — R531 Weakness: Secondary | ICD-10-CM

## 2013-10-03 DIAGNOSIS — R0600 Dyspnea, unspecified: Secondary | ICD-10-CM | POA: Diagnosis present

## 2013-10-03 DIAGNOSIS — K219 Gastro-esophageal reflux disease without esophagitis: Secondary | ICD-10-CM | POA: Diagnosis present

## 2013-10-03 DIAGNOSIS — R9389 Abnormal findings on diagnostic imaging of other specified body structures: Secondary | ICD-10-CM

## 2013-10-03 DIAGNOSIS — D509 Iron deficiency anemia, unspecified: Secondary | ICD-10-CM

## 2013-10-03 DIAGNOSIS — E669 Obesity, unspecified: Secondary | ICD-10-CM

## 2013-10-03 DIAGNOSIS — IMO0002 Reserved for concepts with insufficient information to code with codable children: Secondary | ICD-10-CM

## 2013-10-03 HISTORY — DX: Chronic diastolic (congestive) heart failure: I50.32

## 2013-10-03 LAB — BASIC METABOLIC PANEL
Anion gap: 12 (ref 5–15)
BUN: 26 mg/dL — ABNORMAL HIGH (ref 6–23)
CO2: 23 mEq/L (ref 19–32)
Calcium: 8.9 mg/dL (ref 8.4–10.5)
Chloride: 106 mEq/L (ref 96–112)
Creatinine, Ser: 0.79 mg/dL (ref 0.50–1.10)
GFR calc Af Amer: >90 mL/min (ref >90)
GFR calc non Af Amer: 81 mL/min — ABNORMAL LOW (ref >90)
Glucose, Bld: 434 mg/dL — ABNORMAL HIGH (ref 70–99)
Potassium: 4.1 mEq/L (ref 3.7–5.3)
Sodium: 141 mEq/L (ref 137–147)

## 2013-10-03 LAB — CBC
HEMATOCRIT: 24.6 % — AB (ref 36.0–46.0)
Hemoglobin: 7.9 g/dL — ABNORMAL LOW (ref 12.0–15.0)
MCH: 28.3 pg (ref 26.0–34.0)
MCHC: 32.1 g/dL (ref 30.0–36.0)
MCV: 88.2 fL (ref 78.0–100.0)
PLATELETS: 109 10*3/uL — AB (ref 150–400)
RBC: 2.79 MIL/uL — ABNORMAL LOW (ref 3.87–5.11)
RDW: 22.8 % — ABNORMAL HIGH (ref 11.5–15.5)
WBC: 8.7 10*3/uL (ref 4.0–10.5)

## 2013-10-03 LAB — GLUCOSE, CAPILLARY
Glucose-Capillary: 63 mg/dL — ABNORMAL LOW (ref 70–99)
Glucose-Capillary: 110 mg/dL — ABNORMAL HIGH (ref 70–99)

## 2013-10-03 LAB — TROPONIN I: Troponin I: <0.3 ng/mL (ref <0.30)

## 2013-10-03 LAB — PRO B NATRIURETIC PEPTIDE: Pro B Natriuretic peptide (BNP): 155.7 pg/mL — ABNORMAL HIGH (ref 0–125)

## 2013-10-03 LAB — HEMOGLOBIN A1C
Hgb A1c MFr Bld: 7 % — ABNORMAL HIGH (ref <5.7)
Mean Plasma Glucose: 154 mg/dL — ABNORMAL HIGH (ref <117)

## 2013-10-03 LAB — PREPARE RBC (CROSSMATCH)

## 2013-10-03 MED ORDER — INSULIN DETEMIR 100 UNIT/ML ~~LOC~~ SOLN: 30.0000 [IU] | Freq: Every day | SUBCUTANEOUS | Status: DC

## 2013-10-03 MED ORDER — SODIUM CHLORIDE 0.9 % IV SOLN
Freq: Once | INTRAVENOUS | Status: AC
  Administered 2013-10-03: 17:00:00 via INTRAVENOUS

## 2013-10-03 MED ORDER — ACETAMINOPHEN 650 MG RE SUPP: 650.0000 mg | Freq: Four times a day (QID) | RECTAL | Status: DC | PRN

## 2013-10-03 MED ORDER — OXYCODONE HCL 5 MG PO TABS: 5.0000 mg | ORAL_TABLET | ORAL | Status: DC | PRN

## 2013-10-03 MED ORDER — INSULIN DETEMIR 100 UNIT/ML ~~LOC~~ SOLN
30.0000 [IU] | Freq: Every day | SUBCUTANEOUS | Status: DC
  Administered 2013-10-03 – 2013-10-04 (×2): 30 [IU] via SUBCUTANEOUS
  Filled 2013-10-03 (×2): qty 0.3

## 2013-10-03 MED ORDER — ACETAMINOPHEN 325 MG PO TABS
650.0000 mg | ORAL_TABLET | Freq: Once | ORAL | Status: AC
  Administered 2013-10-03: 650 mg via ORAL
  Filled 2013-10-03: qty 2

## 2013-10-03 MED ORDER — INSULIN DETEMIR 100 UNIT/ML ~~LOC~~ SOLN
45.0000 [IU] | Freq: Every day | SUBCUTANEOUS | Status: DC
  Filled 2013-10-03: qty 0.45

## 2013-10-03 MED ORDER — INSULIN ASPART 100 UNIT/ML ~~LOC~~ SOLN: 4.0000 [IU] | Freq: Three times a day (TID) | SUBCUTANEOUS | Status: DC

## 2013-10-03 MED ORDER — ONDANSETRON HCL 4 MG PO TABS: 4.0000 mg | ORAL_TABLET | Freq: Four times a day (QID) | ORAL | Status: DC | PRN

## 2013-10-03 MED ORDER — ACETAMINOPHEN 325 MG PO TABS: 650.0000 mg | ORAL_TABLET | Freq: Four times a day (QID) | ORAL | Status: DC | PRN

## 2013-10-03 MED ORDER — FUROSEMIDE 10 MG/ML IJ SOLN
20.0000 mg | Freq: Once | INTRAMUSCULAR | Status: AC
  Administered 2013-10-03: 20 mg via INTRAVENOUS
  Filled 2013-10-03: qty 4

## 2013-10-03 MED ORDER — SODIUM CHLORIDE 0.9 % IV SOLN
Freq: Once | INTRAVENOUS | Status: AC
  Administered 2013-10-03: 20:00:00 via INTRAVENOUS

## 2013-10-03 MED ORDER — SALINE SPRAY 0.65 % NA SOLN
1.0000 | NASAL | Status: DC | PRN
  Filled 2013-10-03: qty 44

## 2013-10-03 MED ORDER — INSULIN ASPART 100 UNIT/ML ~~LOC~~ SOLN
15.0000 [IU] | Freq: Once | SUBCUTANEOUS | Status: AC
  Administered 2013-10-03: 15 [IU] via SUBCUTANEOUS
  Filled 2013-10-03: qty 1

## 2013-10-03 MED ORDER — ENALAPRIL MALEATE 20 MG PO TABS
20.0000 mg | ORAL_TABLET | Freq: Every day | ORAL | Status: DC
  Administered 2013-10-04 – 2013-10-05 (×2): 20 mg via ORAL
  Filled 2013-10-03 (×2): qty 1

## 2013-10-03 MED ORDER — SODIUM CHLORIDE 0.9 % IV SOLN
INTRAVENOUS | Status: DC
  Administered 2013-10-04: 16:00:00 via INTRAVENOUS

## 2013-10-03 MED ORDER — INSULIN ASPART 100 UNIT/ML ~~LOC~~ SOLN: 0.0000 [IU] | Freq: Every day | SUBCUTANEOUS | Status: DC

## 2013-10-03 MED ORDER — ALUM & MAG HYDROXIDE-SIMETH 200-200-20 MG/5ML PO SUSP: 30.0000 mL | Freq: Four times a day (QID) | ORAL | Status: DC | PRN

## 2013-10-03 MED ORDER — DIPHENHYDRAMINE HCL 25 MG PO CAPS
25.0000 mg | ORAL_CAPSULE | Freq: Once | ORAL | Status: AC
  Administered 2013-10-03: 25 mg via ORAL
  Filled 2013-10-03: qty 1

## 2013-10-03 MED ORDER — INSULIN ASPART 100 UNIT/ML ~~LOC~~ SOLN
0.0000 [IU] | Freq: Three times a day (TID) | SUBCUTANEOUS | Status: DC
  Administered 2013-10-04: 2 [IU] via SUBCUTANEOUS
  Administered 2013-10-04: 8 [IU] via SUBCUTANEOUS

## 2013-10-03 MED ORDER — ONDANSETRON HCL 4 MG/2ML IJ SOLN: 4.0000 mg | Freq: Four times a day (QID) | INTRAMUSCULAR | Status: DC | PRN

## 2013-10-03 MED ORDER — OXYMETAZOLINE HCL 0.05 % NA SOLN
1.0000 | Freq: Two times a day (BID) | NASAL | Status: DC | PRN
  Filled 2013-10-03: qty 15

## 2013-10-03 MED ORDER — PANTOPRAZOLE SODIUM 40 MG IV SOLR
40.0000 mg | Freq: Two times a day (BID) | INTRAVENOUS | Status: DC
  Administered 2013-10-03 – 2013-10-04 (×3): 40 mg via INTRAVENOUS
  Filled 2013-10-03 (×5): qty 40

## 2013-10-03 MED ORDER — VITAMIN B-12 1000 MCG PO TABS
1000.0000 ug | ORAL_TABLET | Freq: Every day | ORAL | Status: DC
  Administered 2013-10-03 – 2013-10-04 (×2): 1000 ug via ORAL
  Filled 2013-10-03 (×3): qty 1

## 2013-10-03 NOTE — ED Provider Notes (Signed)
CSN: [id number]     Arrival date & time 10/03/13  1249 History   First MD Initiated Contact with Patient 10/03/13 1348     Chief Complaint  Patient presents with  . Shortness of Breath     (Consider location/radiation/quality/duration/timing/severity/associated sxs/prior Treatment) HPI Comments: Patient presents to the ER for evaluation of shortness of breath. Patient reports that she has been experiencing progressively worsening difficulty breathing for one week. Symptoms significantly worsen when she walked. She is having trouble walking around her home because of shortness of breath and fatigue. She is not experiencing any chest pain or palpitations with the symptoms. Patient noted nausea, vomiting or diarrhea. No rectal bleeding.  Patient is a 73 y.o. female presenting with shortness of breath.  Shortness of Breath   Past Medical History  Diagnosis Date  . Anemia   . S/P endoscopy     with laser treatment  08/19/2006  . History of epistaxis   . Hypertension   . Hyperlipidemia   . Telangiectasia     Gastric   . Lichen planus     Both lower extremities  . GERD (gastroesophageal reflux disease)   . Heart murmur   . HTN (hypertension), benign 03/02/2012  . Family history of anesthesia complication     "niece has a hard time coming out" (09/15/2012)  . CHF (congestive heart failure)   . Pneumonia 1990's    "twice" (09/15/2012)  . Type II diabetes mellitus     insulin requiring.  Marland Kitchen History of blood transfusion     "getting 2 today; have had transfusions before" (09/15/2012)   Past Surgical History  Procedure Laterality Date  . Nose surgery      "for bleeding" (09/15/2012)  . Savory dilation  02/26/2011    Procedure: SAVORY DILATION;  Surgeon: [name], MD;  Location: [name];  Service: Endoscopy;  Laterality: N/A;  c-arm needed  . Esophagogastroduodenoscopy  02/26/2011    Procedure: ESOPHAGOGASTRODUODENOSCOPY (EGD);  Surgeon: [name], MD;  Location: [name];   Service: Endoscopy;  Laterality: N/A;  . Esophagogastroduodenoscopy N/A 11/08/2012    Procedure: ESOPHAGOGASTRODUODENOSCOPY (EGD);  Surgeon: [name], MD;  Location: [name];  Service: Endoscopy;  Laterality: N/A;   Family History  Problem Relation Age of Onset  . Breast cancer    . Malignant hyperthermia Neg Hx   . Stroke Father    History  Substance Use Topics  . Smoking status: Former Smoker -- 2 years    Types: Cigarettes  . Smokeless tobacco: Never Used     Comment: 09/15/2012 "smoked 50-60 yr ago"  . Alcohol Use: No   OB History   Grav Para Term Preterm Abortions TAB SAB Ect Mult Living                 Review of Systems  Constitutional: Positive for fatigue.  Respiratory: Positive for shortness of breath.   All other systems reviewed and are negative.     Allergies  Aspirin  Home Medications   Prior to Admission medications   Medication Sig Start Date End Date Taking? Authorizing Provider  enalapril (VASOTEC) 20 MG tablet Take 20 mg by mouth daily.   Yes Historical Provider, MD  Insulin Detemir (LEVEMIR FLEXPEN) 100 UNIT/ML SOPN Inject 45 Units into the skin at bedtime.   Yes Historical Provider, MD  pantoprazole (PROTONIX) 40 MG tablet Take 40 mg by mouth daily as needed. For heartburn   Yes Historical Provider, MD  sodium chloride (OCEAN)  0.65 % nasal spray Place 1 spray into the nose as needed. For nasal congestion   Yes Historical Provider, MD  vitamin B-12 (CYANOCOBALAMIN) 1000 MCG tablet Take 1,000 mcg by mouth at bedtime.    Yes Historical Provider, MD  Vitamin D, Ergocalciferol, (DRISDOL) 50000 UNITS CAPS Take 50,000 Units by mouth every 7 (seven) days. Takes on Monday   Yes Historical Provider, MD   BP 140/42  Pulse 73  Temp(Src) 98 F (36.7 C) (Oral)  Resp 16  Wt 155 lb (70.308 kg)  SpO2 99% Physical Exam  Constitutional: She is oriented to person, place, and time. She appears well-developed and well-nourished. No distress.  HENT:  Head:  Normocephalic and atraumatic.  Right Ear: Hearing normal.  Left Ear: Hearing normal.  Nose: Nose normal.  Mouth/Throat: Oropharynx is clear and moist and mucous membranes are normal.  Eyes: Conjunctivae and EOM are normal. Pupils are equal, round, and reactive to light.  Neck: Normal range of motion. Neck supple.  Cardiovascular: Regular rhythm, S1 normal and S2 normal.  Exam reveals no gallop and no friction rub.   No murmur heard. Pulmonary/Chest: Effort normal and breath sounds normal. No respiratory distress. She exhibits no tenderness.  Abdominal: Soft. Normal appearance and bowel sounds are normal. There is no hepatosplenomegaly. There is no tenderness. There is no rebound, no guarding, no tenderness at McBurney's point and negative Murphy's sign. No hernia.  Musculoskeletal: Normal range of motion.  Neurological: She is alert and oriented to person, place, and time. She has normal strength. No cranial nerve deficit or sensory deficit. Coordination normal. GCS eye subscore is 4. GCS verbal subscore is 5. GCS motor subscore is 6.  Skin: Skin is warm, dry and intact. No rash noted. No cyanosis.  Psychiatric: She has a normal mood and affect. Her speech is normal and behavior is normal. Thought content normal.    ED Course  Procedures (including critical care time) Labs Review Labs Reviewed  BASIC METABOLIC PANEL - Abnormal; Notable for the following:    Glucose, Bld 434 (*)    BUN 26 (*)    GFR calc non Af Amer 81 (*)    All other components within normal limits  CBC - Abnormal; Notable for the following:    RBC 2.79 (*)    Hemoglobin 7.9 (*)    HCT 24.6 (*)    RDW 22.8 (*)    Platelets 109 (*)    All other components within normal limits  PRO B NATRIURETIC PEPTIDE - Abnormal; Notable for the following:    Pro B Natriuretic peptide (BNP) 155.7 (*)    All other components within normal limits  TROPONIN I  POC OCCULT BLOOD, ED    Imaging Review Dg Chest 2 View (if Patient  Has Fever And/or Copd)  10/03/2013   CLINICAL DATA:  Shortness of breath  EXAM: CHEST  2 VIEW  COMPARISON:  09/15/2012  FINDINGS: Cardiomegaly with pulmonary vascular congestion and possible mild interstitial edema, equivocal. No pleural effusion or pneumothorax.  Mild degenerative changes of the visualized thoracolumbar spine.  IMPRESSION: Cardiomegaly with pulmonary vascular congestion and possible mild interstitial edema, equivocal.   Electronically Signed   By: Julian Hy M.D.   On: 10/03/2013 13:49     EKG Interpretation   Date/Time:  Tuesday October 03 2013 13:52:18 EDT Ventricular Rate:  70 PR Interval:  146 QRS Duration: 71 QT Interval:  402 QTC Calculation: 434 R Axis:   34 Text Interpretation:  Sinus rhythm Multiple  premature complexes, vent   Nonspecific T abnormalities, lateral leads No significant change since  last tracing Confirmed by POLLINA  MD, CHRISTOPHER [phone]) on 10/03/2013  2:05:50 PM      MDM   Final diagnoses:  None   systematic anemia  Patient presents to the ER for evaluation of difficulty breathing. Patient had progressively worsening shortness of breath with exertion over the past one week. 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 Patient's chest x-ray did not show any obvious abnormality, possibly revealed mild pulmonary vascular congestion and interstitial edema. Her BNP, however, is normal and stable. Patient with difficulty ambulating here in the ER, will require hospitalization for further management of symptomatic anemia. Will arrange for transfusion of 2 units PRBC.    Orpah Greek, MD 10/03/13 1537

## 2013-10-03 NOTE — Progress Notes (Signed)
Pm CBG 63, on-call notified. New orders given. Will continue to monitor.

## 2013-10-03 NOTE — ED Notes (Signed)
Attempted to get patient labs was unsuccessful.Patient went to x-ray will attempt again when she return.

## 2013-10-03 NOTE — Progress Notes (Signed)
Utilization Review completed.  Gaston Dase RN CM  

## 2013-10-03 NOTE — ED Notes (Signed)
Pt alert, arrives from home, c/o sob, onset was a week ago, states worse with ambulation, dry npc noted in triage, resp even unlabored

## 2013-10-03 NOTE — H&P (Signed)
History and Physical:    [name] [id number] DOB: 30-Mar-1940 DOA: 10/03/2013  Referring physician: Dr. Betsey Holiday  PCP: Philis Fendt, MD   Chief Complaint: Shortness of breath, black stools  History of Present Illness:   [name] is an 73 y.o. 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 [name] on 09/26/13 who noted that her transfusion requirements are becoming more frequent, who presents with progressive dyspnea and fatigue along with melanotic stools since her most recent transfusion. Her no specific aggravating or alleviating factors.  ROS:   Constitutional: No fever, no chills;  Appetite normal; No weight loss, no weight gain, + fatigue.  HEENT: No blurry vision, no diplopia, no pharyngitis, no dysphagia, + epistaxis CV: No chest pain, + palpitations, + PND, + 4 pillow orthopnea, no edema.  Resp: + SOB, + cough, no pleuritic pain. GI: No nausea, no vomiting, + diarrhea, + melena, + hematochezia, no constipation, no abdominal pain.  GU: No dysuria, no hematuria, no frequency, no urgency. MSK: no myalgias, no arthralgias.  Neuro:  No headache, no focal neurological deficits, no history of seizures.  Psych: No depression, no anxiety.  Endo: No heat intolerance, + cold intolerance, no polyuria, no polydipsia  Skin: No rashes, + skin lesions (lichen planus).  Heme: No easy bruising.  Travel history: No recent travel.   Past Medical History:   Past Medical History  Diagnosis Date  . Anemia   . S/P endoscopy     with laser treatment  08/19/2006  . History of epistaxis   . Hypertension   . Hyperlipidemia   . Telangiectasia    Gastric   . Lichen planus     Both lower extremities  . GERD (gastroesophageal reflux disease)   . Heart murmur   . HTN (hypertension), benign 03/02/2012  . Family history of anesthesia complication     "niece has a hard time coming out" (09/15/2012)  . CHF (congestive heart failure)   . Pneumonia 1990's    "twice" (09/15/2012)  . Type II diabetes mellitus     insulin requiring.  Marland Kitchen History of blood transfusion     "getting 2 today; have had transfusions before" (09/15/2012)    Past Surgical History:   Past Surgical History  Procedure Laterality Date  . Nose surgery      "for bleeding" (09/15/2012)  . Savory dilation  02/26/2011    Procedure: SAVORY DILATION;  Surgeon: [name], MD;  Location: [name];  Service: Endoscopy;  Laterality: N/A;  c-arm needed  . Esophagogastroduodenoscopy  02/26/2011    Procedure: ESOPHAGOGASTRODUODENOSCOPY (EGD);  Surgeon: [name], MD;  Location: [name];  Service: Endoscopy;  Laterality: N/A;  . Esophagogastroduodenoscopy N/A 11/08/2012    Procedure: ESOPHAGOGASTRODUODENOSCOPY (EGD);  Surgeon: [name], MD;  Location: [name];  Service: Endoscopy;  Laterality: N/A;  . Cataract surgery      Social History:   History   Social History  . Marital Status: Married    Spouse Name: N/A    Number of Children: 0  . Years of Education: 9   Occupational History  . Retired Engineer, manufacturing systems    Social History Main Topics  . Smoking status: Former  Smoker -- 2 years    Types: Cigarettes    Quit date: 10/03/1973  . Smokeless tobacco: Never Used     Comment: 09/15/2012 "smoked 50-60 yr ago"  . Alcohol Use: No  . Drug Use: No  . Sexual Activity: Not on file   Other Topics Concern  . Not on file   Social History Narrative   Married, lives with husband.  Ambulates without assistance.      Family history:   Family History  Problem Relation Age of Onset  . Breast cancer    . Malignant hyperthermia Neg Hx   . Stroke Father      Allergies   Aspirin  Current Medications:   Prior to Admission medications   Medication Sig Start Date End Date Taking? Authorizing Provider  enalapril (VASOTEC) 20 MG tablet Take 20 mg by mouth daily.   Yes Historical Provider, MD  Insulin Detemir (LEVEMIR FLEXPEN) 100 UNIT/ML SOPN Inject 45 Units into the skin at bedtime.   Yes Historical Provider, MD  pantoprazole (PROTONIX) 40 MG tablet Take 40 mg by mouth daily as needed. For heartburn   Yes Historical Provider, MD  sodium chloride (OCEAN) 0.65 % nasal spray Place 1 spray into the nose as needed. For nasal congestion   Yes Historical Provider, MD  vitamin B-12 (CYANOCOBALAMIN) 1000 MCG tablet Take 1,000 mcg by mouth at bedtime.    Yes Historical Provider, MD  Vitamin D, Ergocalciferol, (DRISDOL) 50000 UNITS CAPS Take 50,000 Units by mouth every 7 (seven) days. Takes on Monday   Yes Historical Provider, MD    Physical Exam:   Filed Vitals:   10/03/13 1259 10/03/13 1400 10/03/13 1500  BP: 140/42 144/41 103/67  Pulse: 73 72 64  Temp: 98 F (36.7 C)    TempSrc: Oral    Resp: 16 18 21   Weight: 70.308 kg (155 lb)    SpO2: 99% 100% 99%     Physical Exam: Blood pressure 103/67, pulse 64, temperature 98 F (36.7 C), temperature source Oral, resp. rate 21, weight 70.308 kg (155 lb), SpO2 99.00%. Gen: No acute distress. Head: Normocephalic, atraumatic. Eyes: PERRL, EOMI, sclerae nonicteric. Mouth: Oropharynx clear, edentulous. Neck: Supple, no thyromegaly, no lymphadenopathy, no jugular venous distention. Chest: Lungs clear to auscultation bilaterally with good air movement. CV: Heart sounds are regular. No murmurs, rubs, or gallops. Abdomen: Soft, nontender, nondistended with normal active bowel sounds. Extremities: Extremities are without clubbing, edema, or cyanosis. Skin: Warm and dry. Lichen planus rash to bilateral lower extremities. Neuro: Alert and oriented times 3; cranial nerves II through XII grossly  intact. Psych: Mood and affect normal.   Data Review:    Labs: Basic Metabolic Panel:  Recent Labs Lab 10/03/13 1331  NA 141  K 4.1  CL 106  CO2 23  GLUCOSE 434*  BUN 26*  CREATININE 0.79  CALCIUM 8.9   CBC:  Recent Labs Lab 10/03/13 1331  WBC 8.7  HGB 7.9*  HCT 24.6*  MCV 88.2  PLT 109*   Cardiac Enzymes:  Recent Labs Lab 10/03/13 1331  TROPONINI <0.30    BNP (last 3 results)  Recent Labs  10/03/13 1400  PROBNP 155.7*    Radiographic Studies: Dg Chest 2 View (if Patient Has Fever And/or Copd)  10/03/2013   CLINICAL DATA:  Shortness of breath  EXAM: CHEST  2 VIEW  COMPARISON:  09/15/2012  FINDINGS: Cardiomegaly with pulmonary vascular congestion and possible mild interstitial edema, equivocal. No pleural effusion or pneumothorax.  Mild degenerative  changes of the visualized thoracolumbar spine.  IMPRESSION: Cardiomegaly with pulmonary vascular congestion and possible mild interstitial edema, equivocal.   Electronically Signed   By: Julian Hy M.D.   On: 10/03/2013 13:49    EKG: Independently reviewed. Sinus rhythm at 76 beats per minute. T-wave inversions V5 and V6. Premature ventricular and supraventricular contractions.   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  GI consulted (case discussed with Dr. Oletta Lamas).  BID PPI therapy.  CL diet for now.  Transfuse 1 unit of PRBCs.  Check post transfusion H&H.  We'll notify Dr. [name] of the patient's admission.  Active Problems:   Hypertension  Renal function stable, okay to continue Vasotec.    DM (diabetes mellitus), type 2 with complications  Continue home dose of Levemir and add SSI, moderate scale, with 4 units of meal coverage.  Check hemoglobin A1c. Markedly hyperglycemic on  BMET.  Diabetes coordinator consultation requested.    SYMPTOM, EPISTAXIS  Afrin when necessary ordered.    Dyspnea / acute on chronic diastolic CHF  Likely secondary to anemia. Mild elevation of proBNP noted. EF 60-65% with grade 2 diastolic dysfunction on prior echo done 09/2012. Watch fluid balance closely with administration of blood products, especially given interstitial edema noted on chest radiography. Will receive Lasix after her blood transfusion.    DVT prophylaxis  Use SCDs only.  Code Status: Full. Family Communication:[name] (husband) [phone] updated at the bedside. Disposition Plan: Home when stable.  Time spent: 70 minutes.  RAMA,CHRISTINA [facility] Pager [phone] Cell: [phone]   If 7PM-7AM, please contact night-coverage [email or web address] Password [facility] 10/03/2013, 4:17 PM

## 2013-10-03 NOTE — Consult Note (Signed)
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 PCP: Dr Avbuere. Primary G.I.: Dr. [name] Hayes  [name] is an 73 y.o. female.  HPI: she has been followed by my partner Dr. Hayes since the mid-1990s with chronic G.I. bleeding. She has been diagnosed with hereditary telangiectasia and has had multiple EGD s with coagulation of gastric telangiectasia, colonoscopies last being 2007 by Dr. Hayes with no telangiectasia noted and has had small bowel series and small bowel capsule endoscopy. She last had EGD was treatment of gastric telangiectasia 9/14 by Dr. Hung prior that same procedure by Dr. Hayes. She sees Dr. Grandfortuna and has been getting chronic IV infusions of iron with occasional blood transfusions. She was admitted with 3 weeks of progressive fatigue. She was last seen 8/18 by Dr. Grandfortuna who noted that her transfusion requirements for becoming more frequent. The patient states that she is head black stools for several weeks intermittently. Adamantly denies abdominal pain, NSAID use etc. She does have esophageal reflux and is on chronic protonix. She has had prior reflux stricture and Dr. Hayes is dilated this in the past. She's not currently having any dysphagia. Other chronic problems include hypertension, history of congestive heart failure related to anemia, type II diabetes  Past Medical History  Diagnosis Date  . Anemia   . S/P endoscopy     with laser treatment  08/19/2006  . History of epistaxis   . Hypertension   . Hyperlipidemia   . Telangiectasia     Gastric   . Lichen planus     Both lower extremities  . GERD (gastroesophageal reflux disease)   . Heart murmur   . HTN (hypertension), benign 03/02/2012  . Family history of anesthesia complication     "niece has a hard time coming out" (09/15/2012)  . CHF (congestive heart failure)   . Pneumonia 1990's    "twice"  (09/15/2012)  . Type II diabetes mellitus     insulin requiring.  . History of blood transfusion     "getting 2 today; have had transfusions before" (09/15/2012)    Past Surgical History  Procedure Laterality Date  . Nose surgery      "for bleeding" (09/15/2012)  . Savory dilation  02/26/2011    Procedure: SAVORY DILATION;  Surgeon: [name] C Hayes, MD;  Location: [name];  Service: Endoscopy;  Laterality: N/A;  c-arm needed  . Esophagogastroduodenoscopy  02/26/2011    Procedure: ESOPHAGOGASTRODUODENOSCOPY (EGD);  Surgeon: [name] C Hayes, MD;  Location: [name];  Service: Endoscopy;  Laterality: N/A;  . Esophagogastroduodenoscopy N/A 11/08/2012    Procedure: ESOPHAGOGASTRODUODENOSCOPY (EGD);  Surgeon: Patrick D Hung, MD;  Location: [name];  Service: Endoscopy;  Laterality: N/A;  . Cataract surgery      Family History  Problem Relation Age of Onset  . Breast cancer    . Malignant hyperthermia Neg Hx   . Stroke Father     Social History:  reports that she quit smoking about 40 years ago. Her smoking use included Cigarettes. She smoked 0.00 packs per day for 2 years. She has never used smokeless tobacco. She reports that she does not drink alcohol or use illicit drugs.  Allergies:  Allergies  Allergen Reactions  . Aspirin Nausea And Vomiting    Medications; Prior to Admission medications   Medication Sig Start Date End Date Taking? Authorizing Provider  enalapril (VASOTEC) 20 MG tablet Take 20 mg by mouth daily.     Yes Historical Provider, MD  Insulin Detemir (LEVEMIR FLEXPEN) 100 UNIT/ML SOPN Inject 45 Units into the skin at bedtime.   Yes Historical Provider, MD  pantoprazole (PROTONIX) 40 MG tablet Take 40 mg by mouth daily as needed. For heartburn   Yes Historical Provider, MD  sodium chloride (OCEAN) 0.65 % nasal spray Place 1 spray into the nose as needed. For nasal congestion   Yes Historical Provider, MD  vitamin B-12 (CYANOCOBALAMIN) 1000 MCG tablet Take 1,000 mcg by  mouth at bedtime.    Yes Historical Provider, MD  Vitamin D, Ergocalciferol, (DRISDOL) 50000 UNITS CAPS Take 50,000 Units by mouth every 7 (seven) days. Takes on Monday   Yes Historical Provider, MD   . sodium chloride   Intravenous Once   PRN Meds  Results for orders placed during the hospital encounter of 10/03/13 (from the past 48 hour(s))  BASIC METABOLIC PANEL     Status: Abnormal   Collection Time    10/03/13  1:31 PM      Result Value Ref Range   Sodium 141  137 - 147 mEq/L   Potassium 4.1  3.7 - 5.3 mEq/L   Chloride 106  96 - 112 mEq/L   CO2 23  19 - 32 mEq/L   Glucose, Bld 434 (*) 70 - 99 mg/dL   BUN 26 (*) 6 - 23 mg/dL   Creatinine, Ser 0.79  0.50 - 1.10 mg/dL   Calcium 8.9  8.4 - 10.5 mg/dL   GFR calc non Af Amer 81 (*) >90 mL/min   GFR calc Af Amer >90  >90 mL/min   Comment: (NOTE)     The eGFR has been calculated using the CKD EPI equation.     This calculation has not been validated in all clinical situations.     eGFR's persistently <90 mL/min signify possible Chronic Kidney     Disease.   Anion gap 12  5 - 15  CBC     Status: Abnormal   Collection Time    10/03/13  1:31 PM      Result Value Ref Range   WBC 8.7  4.0 - 10.5 K/uL   RBC 2.79 (*) 3.87 - 5.11 MIL/uL   Hemoglobin 7.9 (*) 12.0 - 15.0 g/dL   HCT 24.6 (*) 36.0 - 46.0 %   MCV 88.2  78.0 - 100.0 fL   MCH 28.3  26.0 - 34.0 pg   MCHC 32.1  30.0 - 36.0 g/dL   RDW 22.8 (*) 11.5 - 15.5 %   Platelets 109 (*) 150 - 400 K/uL   Comment: REPEATED TO VERIFY     SPECIMEN CHECKED FOR CLOTS     PLATELET COUNT CONFIRMED BY SMEAR  TROPONIN I     Status: None   Collection Time    10/03/13  1:31 PM      Result Value Ref Range   Troponin I <0.30  <0.30 ng/mL   Comment:            Due to the release kinetics of cTnI,     a negative result within the first hours     of the onset of symptoms does not rule out     myocardial infarction with certainty.     If myocardial infarction is still suspected,     repeat the  test at appropriate intervals.  PRO B NATRIURETIC PEPTIDE     Status: Abnormal   Collection Time    10/03/13  2:00 PM        Result Value Ref Range   Pro B Natriuretic peptide (BNP) 155.7 (*) 0 - 125 pg/mL    Dg Chest 2 View (if Patient Has Fever And/or Copd)  10/03/2013   CLINICAL DATA:  Shortness of breath  EXAM: CHEST  2 VIEW  COMPARISON:  09/15/2012  FINDINGS: Cardiomegaly with pulmonary vascular congestion and possible mild interstitial edema, equivocal. No pleural effusion or pneumothorax.  Mild degenerative changes of the visualized thoracolumbar spine.  IMPRESSION: Cardiomegaly with pulmonary vascular congestion and possible mild interstitial edema, equivocal.   Electronically Signed   By: Julian Hy M.D.   On: 10/03/2013 13:49               Blood pressure 111/51, pulse 63, temperature 98 F (36.7 C), temperature source Oral, resp. rate 19, weight 70.308 kg (155 lb), SpO2 100.00%.  Physical exam:   General-- pleasant African-American female in no distress Heart-- regular rate and rhythm without murmurs are gallops Lungs--clear Abdomen-- soft and nontender   Assessment: 1. Hereditary telangiectasia. Patients hemoglobin down to 7.9 and she is short of breath. She will likely get transfusion of blood and probable iron in the hospital. I do think it would be reasonable to go ahead and do EGD with plans for APC of any bleeding telangiectasia in the stomach or duodenum. 2. Type II diabetes 3. History of esophageal reflux with reflux stricture, currently without symptoms  Plan: 1. Would go ahead and transfuse as needed. 2. We will place on our list tomorrow afternoon for EGD and treatment of any telangiectasia with the APC. I have discussed this in detail with the patient and her husband who is in the room with her. We will give her clear liquids and NPO after 8 AM.   Abdulhamid Olgin JR,Tywan Siever L 10/03/2013, 5:25 PM

## 2013-10-04 ENCOUNTER — Encounter (HOSPITAL_COMMUNITY): Payer: Self-pay | Admitting: *Deleted

## 2013-10-04 ENCOUNTER — Encounter (HOSPITAL_COMMUNITY)
Admission: EM | Disposition: A | Discharge status: Home / Self Care | Payer: Self-pay | Source: Home / Self Care | Attending: Internal Medicine

## 2013-10-04 HISTORY — PX: ESOPHAGOGASTRODUODENOSCOPY: SHX5428

## 2013-10-04 LAB — COMPREHENSIVE METABOLIC PANEL
ALK PHOS: 95 U/L (ref 39–117)
ALT: 34 U/L (ref 0–35)
AST: 34 U/L (ref 0–37)
Albumin: 2.7 g/dL — ABNORMAL LOW (ref 3.5–5.2)
Anion gap: 11 (ref 5–15)
BUN: 18 mg/dL (ref 6–23)
CALCIUM: 8.8 mg/dL (ref 8.4–10.5)
CO2: 25 meq/L (ref 19–32)
Chloride: 110 mEq/L (ref 96–112)
Creatinine, Ser: 0.77 mg/dL (ref 0.50–1.10)
GFR, EST NON AFRICAN AMERICAN: 81 mL/min — AB (ref >90)
GLUCOSE: 58 mg/dL — AB (ref 70–99)
POTASSIUM: 3.5 meq/L — AB (ref 3.7–5.3)
Sodium: 146 mEq/L (ref 137–147)
Total Bilirubin: 0.5 mg/dL (ref 0.3–1.2)
Total Protein: 5.2 g/dL — ABNORMAL LOW (ref 6.0–8.3)

## 2013-10-04 LAB — TYPE AND SCREEN
ABO/RH(D): A POS
ANTIBODY SCREEN: NEGATIVE
UNIT DIVISION: 0

## 2013-10-04 LAB — GLUCOSE, CAPILLARY
GLUCOSE-CAPILLARY: 126 mg/dL — AB (ref 70–99)
GLUCOSE-CAPILLARY: 171 mg/dL — AB (ref 70–99)
Glucose-Capillary: 48 mg/dL — ABNORMAL LOW (ref 70–99)
Glucose-Capillary: 60 mg/dL — ABNORMAL LOW (ref 70–99)
Glucose-Capillary: 64 mg/dL — ABNORMAL LOW (ref 70–99)
Glucose-Capillary: 253 mg/dL — ABNORMAL HIGH (ref 70–99)

## 2013-10-04 LAB — HEMOGLOBIN AND HEMATOCRIT, BLOOD
HCT: 26.6 % — ABNORMAL LOW (ref 36.0–46.0)
HEMOGLOBIN: 8.6 g/dL — AB (ref 12.0–15.0)

## 2013-10-04 SURGERY — EGD (ESOPHAGOGASTRODUODENOSCOPY)
Anesthesia: Moderate Sedation

## 2013-10-04 MED ORDER — POTASSIUM CHLORIDE CRYS ER 20 MEQ PO TBCR
40.0000 meq | EXTENDED_RELEASE_TABLET | Freq: Once | ORAL | Status: AC
  Administered 2013-10-04: 40 meq via ORAL
  Filled 2013-10-04: qty 2

## 2013-10-04 MED ORDER — BUTAMBEN-TETRACAINE-BENZOCAINE 2-2-14 % EX AERO
INHALATION_SPRAY | CUTANEOUS | Status: DC | PRN
  Administered 2013-10-04: 2 via TOPICAL

## 2013-10-04 MED ORDER — DIPHENHYDRAMINE HCL 50 MG/ML IJ SOLN
INTRAMUSCULAR | Status: AC
  Filled 2013-10-04: qty 1

## 2013-10-04 MED ORDER — GLUCOSE 40 % PO GEL
ORAL | Status: AC
  Filled 2013-10-04: qty 1

## 2013-10-04 MED ORDER — FENTANYL CITRATE 0.05 MG/ML IJ SOLN
INTRAMUSCULAR | Status: AC
  Filled 2013-10-04: qty 4

## 2013-10-04 MED ORDER — FENTANYL CITRATE 0.05 MG/ML IJ SOLN
INTRAMUSCULAR | Status: DC | PRN
  Administered 2013-10-04 (×3): 25 ug via INTRAVENOUS

## 2013-10-04 MED ORDER — MIDAZOLAM HCL 10 MG/2ML IJ SOLN
INTRAMUSCULAR | Status: AC
  Filled 2013-10-04: qty 4

## 2013-10-04 MED ORDER — MIDAZOLAM HCL 10 MG/2ML IJ SOLN
INTRAMUSCULAR | Status: DC | PRN
  Administered 2013-10-04: 1 mg via INTRAVENOUS
  Administered 2013-10-04 (×2): 2 mg via INTRAVENOUS

## 2013-10-04 MED ORDER — GLUCOSE 40 % PO GEL
1.0000 | ORAL | Status: DC | PRN
  Administered 2013-10-04: 37.5 g via ORAL

## 2013-10-04 NOTE — Interval H&P Note (Signed)
History and Physical Interval Note:  10/04/2013 12:32 PM  [place]  has presented today for surgery, with the diagnosis of GI bleed  The various methods of treatment have been discussed with the patient and family. After consideration of risks, benefits and other options for treatment, the patient has consented to  Procedure(s) with comments: ESOPHAGOGASTRODUODENOSCOPY (EGD) (N/A) - with APC on stand-by as a surgical intervention .  The patient's history has been reviewed, patient examined, no change in status, stable for surgery.  I have reviewed the patient's chart and labs.  Questions were answered to the patient's satisfaction.     Anjana Cheek JR,Loletha Bertini L

## 2013-10-04 NOTE — Op Note (Signed)
[facility] [place], [address]   ENDOSCOPY PROCEDURE REPORT  PATIENT: [name], [name]  MR#: [id number] BIRTHDATE: [birth year] , 73  yrs. old GENDER: Female ENDOSCOPIST:Rosaleen Mazer Oletta Lamas, MD REFERRED BY:  [facility]. Dr Jeanie Cooks PROCEDURE DATE:  10/04/2013 PROCEDURE:     EGD with Control of  Bleeding ASA CLASS:     class III INDICATIONS: chronic leading and woman with hereditary telangiectasia requiring frequent iron infusions and transfusions. Has had previous treatments of gastric AVMs in the past. MEDICATION:  fentanyl 75 mcg versed 5 mg IV TOPICAL ANESTHETIC:    cetacaine spray  DESCRIPTION OF PROCEDURE:   The procedure had been explained to the patient and consider obtain. We had obtained consent for control bleeding. The Pentax upper adult scope was inserted into the esophagus with swallowing. Upon entering the stomach and AVM along the posterior wall near the junction of the body in the antrum is seen it was oozing. Multiple other AVMs are seen throughout the stomach were much smaller and were not actively bleeding. We pass a scope to the duodenum the 2nd duodenum and they duodenal bulb were normal. We came back into the stomach vigorously irrigated the leading AVM. It was approximately 1/2 cm in size and I went ahead and placed a clip initially which helped considerably and then cauterized around the edge with the APC.  At the termination of the procedure there are appeared to be in control of bleeding. The patient tolerated procedure well.     COMPLICATIONS: None  ENDOSCOPIC IMPRESSION: 1. Bleeding Gastric AVM. This was controlled with fulgeration with APC and applying endoclip. 2. Hereditary telangiectasia. There were multiple smaller AVMs in the stomach that were not actively bleeding and these were not treated at this time  RECOMMENDATIONS: 1. Follow Clinically. Would keep on PPI therapy .2. Liquid diet for the next several  days until we are sure that everything has stabilized.    _______________________________ Lorrin MaisLaurence Spates, MD 10/04/2013 1:16 PM CC: Dr Jeanie Cooks, Dr. Waymon Budge   PATIENT NAME:  [name], [name] MR#: [id number]

## 2013-10-04 NOTE — Progress Notes (Signed)
Inpatient Diabetes Program Recommendations  AACE/ADA: New Consensus Statement on Inpatient Glycemic Control (2013)  Target Ranges:  Prepandial:   less than 140 mg/dL      Peak postprandial:   less than 180 mg/dL (1-2 hours)      Critically ill patients:  140 - 180 mg/dL   Reason for Visit: Diabetes Consult  Diabetes history: DM2 Outpatient Diabetes medications: Levemir 45 units bid Current orders for Inpatient glycemic control: Levemir 30 units QHS, Novolog moderate tidwc and hs +4 units tidwc  Pt states her MD changed her insulin prescription 6 weeks ago. Levemir was increased to 45 units bid from 45 units QHS. States she has hypoglycemia at home. Checks blood sugars 2x/day. Had hypoglycemia early am and after endoscopy. On CL diet at present and diet will not be advanced today per RN.  Results for RITTIE, MARUT (MRN [id number]) as of 10/04/2013 15:13  Ref. Range 11/10/2012 11:25 12/15/2012 16:25 10/03/2013 22:23 10/03/2013 22:58 10/04/2013 07:16  Glucose-Capillary Latest Range: 70-99 mg/dL 243 (H) 232 (H) 63 (L) 110 (H) 126 (H)  Results for MAKAIYLA, UMHOEFER (MRN [id number]) as of 10/04/2013 15:13  Ref. Range 10/04/2013 02:49  Sodium Latest Range: 136-145 mEq/L 146  Potassium Latest Range: 3.5-5.1 mEq/L 3.5 (L)  Chloride Latest Range: 96-112 mEq/L 110  CO2 Latest Range: 19-32 mEq/L 25  BUN Latest Range: 7.0-26.0 mg/dL 18  Creatinine Latest Range: 0.50-1.10 mg/dL 0.77  Calcium Latest Range: 8.4-10.5 mg/dL 8.8  GFR calc non Af Amer Latest Range: >90 mL/min 81 (L)  GFR calc Af Amer Latest Range: >90 mL/min >90  Glucose Latest Range: 70-99 mg/dl 58 (L)   Results for COLBY, GARANT (MRN [id number]) as of 10/04/2013 15:13  Ref. Range 10/03/2013 16:27  Hemoglobin A1C Latest Range: <5.7 % 7.0 (H)   Doubtful HgbA1C is accurate with anemia. Hypoglycemia likely d/t NPO status. May not need meal coverage insulin while on liquid diet.  Will continue to follow. Thank you. Lorenda Peck, RD, LDN,  CDE Inpatient Diabetes Coordinator [phone]

## 2013-10-04 NOTE — Progress Notes (Signed)
Patient ID: [name], female   DOB: 1940/03/08, 73 y.o.   MRN: [id number] [facility] PROGRESS NOTE  [name] [id number] DOB: 1940-07-20 DOA: 10/03/2013 PCP: Philis Fendt, MD  Brief narrative: 73 y.o. 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 [name] on 09/26/13 who noted that her transfusion requirements are becoming more frequent, who presented with progressive dyspnea and fatigue along with melanotic stools since her most recent transfusion. Plan is for an endoscopy this morning.  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 Plan is for EGD this morning. Patient has received one unit of blood transfusion, post transfusion hemoglobin 8.6. Patient is currently on clear liquid diet. Continue Protonix 40 mg IV every 12 hours. Continue IV fluids, normal saline at 50 cc an hour.   Active Problems:  Hypertension  Renal function stable, okay to continue Vasotec. DM (diabetes mellitus), type 2 with complications  Current insulin regimen includes left matter 30 units at bedtime and NovoLog 4 units 3 times a day with meals along with sliding scale insulin. Hemoglobin A1c on this admission is 7 indicating good glycemic control.  Diabetes coordinator consultation requested. SYMPTOM, EPISTAXIS  Afrin when necessary ordered. Dyspnea / acute on chronic diastolic CHF  Likely secondary to anemia. Mild elevation of proBNP noted. EF 60-65% with grade 2  diastolic dysfunction on prior echo done 09/2012. Watch fluid balance closely with administration of blood products, especially given interstitial edema noted on chest radiography. Patient has received Lasix after blood transfusion. DVT prophylaxis  Use SCDs only due to risk of bleeding.   Code Status: Full.  Family Communication:  plan of care discussed with the patient Disposition Plan: Home when stable.    IV Access:   Peripheral IV Procedures and diagnostic studies:    Dg Chest 2 View (if Patient Has Fever And/or Copd) 10/03/2013   Cardiomegaly with pulmonary vascular congestion and possible mild interstitial edema, equivocal.  Medical Consultants:   Gastroenterology (Dr. Laurence Spates) Other Consultants:   None  Anti-Infectives:   None    Leisa Lenz, MD  [facility] Pager [phone]  If 7PM-7AM, please contact night-coverage [email or web address] Password [facility] 10/04/2013, 9:51 AM   LOS: 1 day    HPI/Subjective: No acute overnight events.  Objective: Filed Vitals:   10/03/13 2145 10/03/13 2246 10/03/13 2323 10/04/13 0506  BP: 118/50 120/48 122/52 129/37  Pulse: 60 72 72 57  Temp: 98.7 F (37.1 C) 98.4 F (36.9 C) 98.5 F (36.9 C) 97.4 F (36.3 C)  TempSrc: Oral Oral Oral Oral  Resp: 16 16 16 16   Height:      Weight:      SpO2: 100% 100% 100% 100%    Intake/Output Summary (Last 24 hours) at 10/04/13 0951 Last data filed at 10/03/13 2307  Gross per 24 hour  Intake    880 ml  Output      0 ml  Net    880 ml    Exam:   General:  Pt is alert, follows commands appropriately, not in acute distress  Cardiovascular:  Regular rate and rhythm, S1/S2, no murmurs  Respiratory: Clear to auscultation bilaterally, no wheezing  Abdomen: Soft, non tender, non distended, bowel sounds present  Extremities: No edema, pulses DP and PT palpable bilaterally  Neuro: Grossly nonfocal  Data Reviewed: Basic Metabolic Panel:  Recent Labs Lab 10/03/13 1331  10/04/13 0249  NA 141 146  K 4.1 3.5*  CL 106 110  CO2 23 25  GLUCOSE 434* 58*  BUN 26* 18  CREATININE 0.79 0.77  CALCIUM 8.9 8.8   Liver Function Tests:  Recent Labs Lab 10/04/13 0249  AST 34  ALT 34  ALKPHOS 95  BILITOT 0.5  PROT 5.2*  ALBUMIN 2.7*   No results found for this basename: LIPASE, AMYLASE,  in the last 168 hours No results found for this basename: AMMONIA,  in the last 168 hours CBC:  Recent Labs Lab 10/03/13 1331 10/04/13 0250  WBC 8.7  --   HGB 7.9* 8.6*  HCT 24.6* 26.6*  MCV 88.2  --   PLT 109*  --    Cardiac Enzymes:  Recent Labs Lab 10/03/13 1331  TROPONINI <0.30   BNP: No components found with this basename: POCBNP,  CBG:  Recent Labs Lab 10/03/13 2223 10/03/13 2258 10/04/13 0716  GLUCAP 63* 110* 126*    No results found for this or any previous visit (from the past 240 hour(s)).   Scheduled Meds: . enalapril  20 mg Oral Daily  . insulin aspart  0-15 Units Subcutaneous TID WC  . insulin aspart  0-5 Units Subcutaneous QHS  . insulin aspart  4 Units Subcutaneous TID WC  . insulin detemir  30 Units Subcutaneous QHS  . pantoprazole   40 mg Intravenous Q12H  . potassium chloride  40 mEq Oral Once  . vitamin B-12  1,000 mcg Oral QHS   Continuous Infusions: . sodium chloride

## 2013-10-04 NOTE — H&P (View-Only) (Signed)
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 PCP: Dr Avbuere. Primary G.I.: Dr. [name] Hayes  [name] is an 73 y.o. female.  HPI: she has been followed by my partner Dr. Hayes since the mid-1990s with chronic G.I. bleeding. She has been diagnosed with hereditary telangiectasia and has had multiple EGD s with coagulation of gastric telangiectasia, colonoscopies last being 2007 by Dr. Hayes with no telangiectasia noted and has had small bowel series and small bowel capsule endoscopy. She last had EGD was treatment of gastric telangiectasia 9/14 by Dr. Hung prior that same procedure by Dr. Hayes. She sees Dr. Grandfortuna and has been getting chronic IV infusions of iron with occasional blood transfusions. She was admitted with 3 weeks of progressive fatigue. She was last seen 8/18 by Dr. Grandfortuna who noted that her transfusion requirements for becoming more frequent. The patient states that she is head black stools for several weeks intermittently. Adamantly denies abdominal pain, NSAID use etc. She does have esophageal reflux and is on chronic protonix. She has had prior reflux stricture and Dr. Hayes is dilated this in the past. She's not currently having any dysphagia. Other chronic problems include hypertension, history of congestive heart failure related to anemia, type II diabetes  Past Medical History  Diagnosis Date  . Anemia   . S/P endoscopy     with laser treatment  08/19/2006  . History of epistaxis   . Hypertension   . Hyperlipidemia   . Telangiectasia     Gastric   . Lichen planus     Both lower extremities  . GERD (gastroesophageal reflux disease)   . Heart murmur   . HTN (hypertension), benign 03/02/2012  . Family history of anesthesia complication     "niece has a hard time coming out" (09/15/2012)  . CHF (congestive heart failure)   . Pneumonia 1990's    "twice"  (09/15/2012)  . Type II diabetes mellitus     insulin requiring.  . History of blood transfusion     "getting 2 today; have had transfusions before" (09/15/2012)    Past Surgical History  Procedure Laterality Date  . Nose surgery      "for bleeding" (09/15/2012)  . Savory dilation  02/26/2011    Procedure: SAVORY DILATION;  Surgeon: [name] C Hayes, MD;  Location: [name];  Service: Endoscopy;  Laterality: N/A;  c-arm needed  . Esophagogastroduodenoscopy  02/26/2011    Procedure: ESOPHAGOGASTRODUODENOSCOPY (EGD);  Surgeon: [name] C Hayes, MD;  Location: [name];  Service: Endoscopy;  Laterality: N/A;  . Esophagogastroduodenoscopy N/A 11/08/2012    Procedure: ESOPHAGOGASTRODUODENOSCOPY (EGD);  Surgeon: Patrick D Hung, MD;  Location: [name];  Service: Endoscopy;  Laterality: N/A;  . Cataract surgery      Family History  Problem Relation Age of Onset  . Breast cancer    . Malignant hyperthermia Neg Hx   . Stroke Father     Social History:  reports that she quit smoking about 40 years ago. Her smoking use included Cigarettes. She smoked 0.00 packs per day for 2 years. She has never used smokeless tobacco. She reports that she does not drink alcohol or use illicit drugs.  Allergies:  Allergies  Allergen Reactions  . Aspirin Nausea And Vomiting    Medications; Prior to Admission medications   Medication Sig Start Date End Date Taking? Authorizing Provider  enalapril (VASOTEC) 20 MG tablet Take 20 mg by mouth daily.     Yes Historical Provider, MD  Insulin Detemir (LEVEMIR FLEXPEN) 100 UNIT/ML SOPN Inject 45 Units into the skin at bedtime.   Yes Historical Provider, MD  pantoprazole (PROTONIX) 40 MG tablet Take 40 mg by mouth daily as needed. For heartburn   Yes Historical Provider, MD  sodium chloride (OCEAN) 0.65 % nasal spray Place 1 spray into the nose as needed. For nasal congestion   Yes Historical Provider, MD  vitamin B-12 (CYANOCOBALAMIN) 1000 MCG tablet Take 1,000 mcg by  mouth at bedtime.    Yes Historical Provider, MD  Vitamin D, Ergocalciferol, (DRISDOL) 50000 UNITS CAPS Take 50,000 Units by mouth every 7 (seven) days. Takes on Monday   Yes Historical Provider, MD   . sodium chloride   Intravenous Once   PRN Meds  Results for orders placed during the hospital encounter of 10/03/13 (from the past 48 hour(s))  BASIC METABOLIC PANEL     Status: Abnormal   Collection Time    10/03/13  1:31 PM      Result Value Ref Range   Sodium 141  137 - 147 mEq/L   Potassium 4.1  3.7 - 5.3 mEq/L   Chloride 106  96 - 112 mEq/L   CO2 23  19 - 32 mEq/L   Glucose, Bld 434 (*) 70 - 99 mg/dL   BUN 26 (*) 6 - 23 mg/dL   Creatinine, Ser 0.79  0.50 - 1.10 mg/dL   Calcium 8.9  8.4 - 10.5 mg/dL   GFR calc non Af Amer 81 (*) >90 mL/min   GFR calc Af Amer >90  >90 mL/min   Comment: (NOTE)     The eGFR has been calculated using the CKD EPI equation.     This calculation has not been validated in all clinical situations.     eGFR's persistently <90 mL/min signify possible Chronic Kidney     Disease.   Anion gap 12  5 - 15  CBC     Status: Abnormal   Collection Time    10/03/13  1:31 PM      Result Value Ref Range   WBC 8.7  4.0 - 10.5 K/uL   RBC 2.79 (*) 3.87 - 5.11 MIL/uL   Hemoglobin 7.9 (*) 12.0 - 15.0 g/dL   HCT 24.6 (*) 36.0 - 46.0 %   MCV 88.2  78.0 - 100.0 fL   MCH 28.3  26.0 - 34.0 pg   MCHC 32.1  30.0 - 36.0 g/dL   RDW 22.8 (*) 11.5 - 15.5 %   Platelets 109 (*) 150 - 400 K/uL   Comment: REPEATED TO VERIFY     SPECIMEN CHECKED FOR CLOTS     PLATELET COUNT CONFIRMED BY SMEAR  TROPONIN I     Status: None   Collection Time    10/03/13  1:31 PM      Result Value Ref Range   Troponin I <0.30  <0.30 ng/mL   Comment:            Due to the release kinetics of cTnI,     a negative result within the first hours     of the onset of symptoms does not rule out     myocardial infarction with certainty.     If myocardial infarction is still suspected,     repeat the  test at appropriate intervals.  PRO B NATRIURETIC PEPTIDE     Status: Abnormal   Collection Time    10/03/13  2:00 PM        Result Value Ref Range   Pro B Natriuretic peptide (BNP) 155.7 (*) 0 - 125 pg/mL    Dg Chest 2 View (if Patient Has Fever And/or Copd)  10/03/2013   CLINICAL DATA:  Shortness of breath  EXAM: CHEST  2 VIEW  COMPARISON:  09/15/2012  FINDINGS: Cardiomegaly with pulmonary vascular congestion and possible mild interstitial edema, equivocal. No pleural effusion or pneumothorax.  Mild degenerative changes of the visualized thoracolumbar spine.  IMPRESSION: Cardiomegaly with pulmonary vascular congestion and possible mild interstitial edema, equivocal.   Electronically Signed   By: Julian Hy M.D.   On: 10/03/2013 13:49               Blood pressure 111/51, pulse 63, temperature 98 F (36.7 C), temperature source Oral, resp. rate 19, weight 70.308 kg (155 lb), SpO2 100.00%.  Physical exam:   General-- pleasant African-American female in no distress Heart-- regular rate and rhythm without murmurs are gallops Lungs--clear Abdomen-- soft and nontender   Assessment: 1. Hereditary telangiectasia. Patients hemoglobin down to 7.9 and she is short of breath. She will likely get transfusion of blood and probable iron in the hospital. I do think it would be reasonable to go ahead and do EGD with plans for APC of any bleeding telangiectasia in the stomach or duodenum. 2. Type II diabetes 3. History of esophageal reflux with reflux stricture, currently without symptoms  Plan: 1. Would go ahead and transfuse as needed. 2. We will place on our list tomorrow afternoon for EGD and treatment of any telangiectasia with the APC. I have discussed this in detail with the patient and her husband who is in the room with her. We will give her clear liquids and NPO after 8 AM.   Zylon Creamer JR,Areli Jowett L 10/03/2013, 5:25 PM

## 2013-10-04 NOTE — Plan of Care (Signed)
Problem: Phase III Progression Outcomes Goal: Voiding independently Outcome: Completed/Met Date Met:  10/04/13 Patient voids without difficultty,no incontinence, ambulates to the BR with standby assist.

## 2013-10-05 ENCOUNTER — Encounter (HOSPITAL_COMMUNITY): Payer: Self-pay | Admitting: Gastroenterology

## 2013-10-05 DIAGNOSIS — E1165 Type 2 diabetes mellitus with hyperglycemia: Secondary | ICD-10-CM

## 2013-10-05 DIAGNOSIS — D509 Iron deficiency anemia, unspecified: Secondary | ICD-10-CM

## 2013-10-05 DIAGNOSIS — IMO0001 Reserved for inherently not codable concepts without codable children: Secondary | ICD-10-CM

## 2013-10-05 LAB — GLUCOSE, CAPILLARY
GLUCOSE-CAPILLARY: 50 mg/dL — AB (ref 70–99)
Glucose-Capillary: 72 mg/dL (ref 70–99)
Glucose-Capillary: 147 mg/dL — ABNORMAL HIGH (ref 70–99)

## 2013-10-05 MED ORDER — OXYMETAZOLINE HCL 0.05 % NA SOLN: 1.0000 | Freq: Two times a day (BID) | NASAL | Status: DC | PRN

## 2013-10-05 MED ORDER — PANTOPRAZOLE SODIUM 40 MG PO TBEC: 40.0000 mg | DELAYED_RELEASE_TABLET | Freq: Every day | ORAL | Status: DC | PRN

## 2013-10-05 MED ORDER — INSULIN DETEMIR 100 UNIT/ML ~~LOC~~ SOLN: 30.0000 [IU] | Freq: Every day | SUBCUTANEOUS | Status: DC

## 2013-10-05 NOTE — Care Management Note (Signed)
CARE MANAGEMENT NOTE 10/05/2013  Patient:  [name], [name]   Account Number:  [id number]  Date Initiated:  10/05/2013  Documentation initiated by:  Leafy Kindle  Subjective/Objective Assessment:   73 yo pt admitted with anemia.     Action/Plan:   from home with husband   Anticipated DC Date:  10/07/2013   Anticipated DC Plan:  HOME/SELF CARE         Choice offered to / List presented to:             Status of service:   Medicare Important Message given?   (If response is "NO", the following Medicare IM given date fields will be blank) Date Medicare IM given:   Medicare IM given by:   Date Additional Medicare IM given:   Additional Medicare IM given by:    Discharge Disposition:    Per UR Regulation:  Reviewed for med. necessity/level of care/duration of stay  If discussed at [place] of Stay Meetings, dates discussed:    Comments:  10/05/13 Marney Doctor RN,BSN,NCM  Receiving IV fluids.  No DC needs noted at this time.  CM will continue to follow.

## 2013-10-05 NOTE — Discharge Instructions (Signed)
Arteriovenous Malformation An arteriovenous malformation (AVM) is a disorder that has been present since birth (congenital). It is characterized by a complex, tangled web of arteries and veins. An AVM may occur in the brain, brainstem, or spinal cord. CAUSES  It is caused by abnormal development of blood vessels. SYMPTOMS  The most common problems (symptoms) of AVM include:  Bleeding (hemorrhaging).  Convulsions (seizures).  Headaches.  Neurological problems, such as:  Paralysis.  Loss of speech, memory, or vision. TREATMENT  There are three general forms of treatment for AVM:  Surgery.  Embolization. This involves closing off the vessels of the AVM by injecting glue into them. Embolization is often used before surgery.  Radiosurgery. This involves focusing radiation on the AVM. AVMs that hemorrhage can lead to serious neurological problems, and sometimes death. But some people have AVMs that never cause problems. Document Released: 01/16/2002 Document Revised: 04/20/2011 Document Reviewed: 09/01/2007 [facility] Patient Information 2015 [place], [place]. This information is not intended to replace advice given to you by your health care provider. Make sure you discuss any questions you have with your health care provider.

## 2013-10-05 NOTE — Discharge Summary (Signed)
Physician Discharge Summary  [name] [id number] DOB: 1940-10-10 DOA: 10/03/2013  PCP: Philis Fendt, MD  Admit date: 10/03/2013 Discharge date: 10/05/2013  Recommendations for Outpatient Follow-up:  1. Clear liquid diet for 3 days then if no bleeding. 2. Continue PPI on discharge.  Discharge Diagnoses:  Principal Problem:   Iron deficiency anemia due to chronic blood loss Active Problems:   DM (diabetes mellitus), type 2 with complications   SYMPTOM, EPISTAXIS   GI bleed   Osler-Weber-Rendu disease   HTN (hypertension), benign   Dyspnea   Chronic blood loss anemia   Acute on chronic diastolic CHF (congestive heart failure)    Discharge Condition: stable   Diet recommendation: as tolerated   History of present illness:  73 y.o. 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 [name] on 09/26/13 who noted that her transfusion requirements are becoming more frequent, who presented with progressive dyspnea and fatigue along with melanotic stools since her most recent transfusion.   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  EGD done 10/04/2013 with findings of bleeding gastric AVM which was controlled with fulgeration with APC and applying endoclip. There were multiple smaller AVMs in the stomach that were not actively bleeding and these were not treated. Per GI pt needs to continue PPI therapy. Liquid diet for next several days until we are sure no  further bleed noted..  Please note patient has received one unit of blood transfusion, post transfusion hemoglobin 8.6.   Active Problems:  Hypertension  Renal function stable, okay to continue Vasotec. DM (diabetes mellitus), type 2 with complications  Current insulin regimen includes left matter 30 units at bedtime and NovoLog 4 units 3 times a day with meals along with sliding scale insulin.  Hemoglobin A1c on this admission is 7 indicating good glycemic control.  Diabetes coordinator consultation requested. SYMPTOM, EPISTAXIS  Afrin when necessary ordered. Dyspnea / acute on chronic diastolic CHF  Likely secondary to anemia. Mild elevation of proBNP noted. EF 60-65% with grade 2 diastolic dysfunction on prior echo done 09/2012. Watch fluid balance closely with administration of blood products, especially given interstitial edema noted on chest radiography. Patient has received Lasix after blood transfusion. DVT prophylaxis  Used SCDs only due to risk of bleeding.   Code Status: Full.  Family Communication: plan of care discussed with the patient   IV Access:   Peripheral IV Procedures and diagnostic studies:   Dg Chest 2 View (if Patient Has Fever And/or Copd) 10/03/2013 Cardiomegaly with pulmonary vascular congestion and possible mild interstitial edema, equivocal.  Medical Consultants:   Gastroenterology (Dr. Laurence Spates) Other Consultants:   None  Anti-Infectives:   None    Signed:  Leisa Lenz, MD  [facility] 10/05/2013, 11:17 AM  Pager #: [phone]   Discharge Exam: Filed Vitals:   10/05/13 0550  BP: 108/34  Pulse: 57  Temp: 97.8 F (36.6 C)  Resp: 14   Filed Vitals:   10/04/13 1335 10/04/13 1400 10/04/13 2223 10/05/13 0550  BP: 124/45 128/42 146/56 108/34  Pulse: 61  95 58 57  Temp: 98 F (36.7 C) 97.5 F (36.4 C) 98.2 F (36.8 C) 97.8 F (36.6 C)  TempSrc: Oral Oral Oral Oral  Resp: 14 18 14 14   Height:      Weight:      SpO2: 100%  100% 99% 100%    General: Pt is alert, follows commands appropriately, not in acute distress Cardiovascular: Regular rate and rhythm, S1/S2 +, no murmurs Respiratory: Clear to auscultation bilaterally, no wheezing, no crackles, no rhonchi Abdominal: Soft, non tender, non distended, bowel sounds +, no guarding Extremities: no edema, no cyanosis, pulses palpable bilaterally DP and PT Neuro: Grossly nonfocal  Discharge Instructions  Discharge Instructions   Call MD for:  difficulty breathing, headache or visual disturbances    Complete by:  As directed      Call MD for:  persistant dizziness or light-headedness    Complete by:  As directed      Call MD for:  persistant nausea and vomiting    Complete by:  As directed      Call MD for:  severe uncontrolled pain    Complete by:  As directed      Diet - low sodium heart healthy    Complete by:  As directed      Discharge instructions    Complete by:  As directed   1. Clear liquid diet for 3 days then if no bleeding. 2. Continue PPI on discharge.     Increase activity slowly    Complete by:  As directed             Medication List         enalapril 20 MG tablet  Commonly known as:  VASOTEC  Take 20 mg by mouth daily.     insulin detemir 100 UNIT/ML injection  Commonly known as:  LEVEMIR  Inject 0.3 mLs (30 Units total) into the skin at bedtime.     oxymetazoline 0.05 % nasal spray  Commonly known as:  AFRIN  Place 1 spray into both nostrils 2 (two) times daily as needed (Epistaxis).     pantoprazole 40 MG tablet  Commonly known as:  PROTONIX  Take 1 tablet (40 mg total) by mouth daily as needed. For heartburn     sodium chloride 0.65 % nasal spray  Commonly known as:  OCEAN  Place 1 spray into the nose as needed. For nasal congestion     vitamin B-12 1000 MCG tablet  Commonly known as:  CYANOCOBALAMIN  Take 1,000 mcg by mouth at bedtime.     Vitamin D (Ergocalciferol) 50000 UNITS Caps capsule  Commonly known as:   DRISDOL  Take 50,000 Units by mouth every 7 (seven) days. Takes on Monday           Follow-up Information   Follow up with Philis Fendt, MD On 10/19/2013. (Follow up appt after recent hospitalization)    Specialty:  Internal Medicine   Contact information:   [place] [id number] [phone]        The results of significant diagnostics from this hospitalization (including imaging, microbiology, ancillary and laboratory) are listed below for reference.    Significant Diagnostic Studies: Dg Chest 2 View (if Patient Has Fever And/or Copd)  10/03/2013   CLINICAL DATA:  Shortness of breath  EXAM: CHEST  2 VIEW  COMPARISON:  09/15/2012  FINDINGS: Cardiomegaly with pulmonary vascular congestion and possible mild interstitial edema, equivocal. No pleural effusion or pneumothorax.  Mild degenerative changes of the visualized thoracolumbar spine.  IMPRESSION: Cardiomegaly with pulmonary vascular congestion and possible mild interstitial edema, equivocal.   Electronically Signed   By: Julian Hy M.D.   On: 10/03/2013 13:49   Mm Digital Diagnostic Unilat R  09/11/2013   CLINICAL DATA:  Six month re-evaluation probably benign right breast calcifications.  EXAM: DIGITAL DIAGNOSTIC  right breast MAMMOGRAM WITH CAD  COMPARISON:  03/17/2013, 03/06/2013, 04/16/2011, 04/03/2011, 09/26/2010.  ACR Breast Density Category b: There are scattered areas of fibroglandular density.  FINDINGS: There are relatively coarse calcifications located superiorly and centrally within the right breast which have developed and coalesced over time most consistent with evolving fibroadenomatoid calcifications. There are no worrisome developing calcifications. There is no mass or distortion.  Mammographic images were processed with CAD.  IMPRESSION: Findings are consistent with evolving fibroadenomatoid calcifications. I recommend bilateral diagnostic mammography in 6 months.  RECOMMENDATION: Bilateral  diagnostic mammography in 6 months.  I have discussed the findings and recommendations with the patient. Results were also provided in writing at the conclusion of the visit. If applicable, a reminder letter will be sent to the patient regarding the next appointment.  BI-RADS CATEGORY  3: Probably benign.   Electronically Signed   By: Luberta Robertson M.D.   On: 09/11/2013 16:14    Microbiology: No results found for this or any previous visit (from the past 240 hour(s)).   Labs: Basic Metabolic Panel:  Recent Labs Lab 10/03/13 1331 10/04/13 0249  NA 141 146  K 4.1 3.5*  CL 106 110  CO2 23 25  GLUCOSE 434* 58*  BUN 26* 18  CREATININE 0.79 0.77  CALCIUM 8.9 8.8   Liver Function Tests:  Recent Labs Lab 10/04/13 0249  AST 34  ALT 34  ALKPHOS 95  BILITOT 0.5  PROT 5.2*  ALBUMIN 2.7*   No results found for this basename: LIPASE, AMYLASE,  in the last 168 hours No results found for this basename: AMMONIA,  in the last 168 hours CBC:  Recent Labs Lab 10/03/13 1331 10/04/13 0250  WBC 8.7  --   HGB 7.9* 8.6*  HCT 24.6* 26.6*  MCV 88.2  --   PLT 109*  --    Cardiac Enzymes:  Recent Labs Lab 10/03/13 1331  TROPONINI <0.30   BNP: BNP (last 3 results)  Recent Labs  10/03/13 1400  PROBNP 155.7*   CBG:  Recent Labs Lab 10/04/13 1446 10/04/13 1619 10/04/13 2227 10/05/13 0803 10/05/13 0824  GLUCAP 64* 253* 171* 50* 72    Time coordinating discharge: Over 30 minutes

## 2013-11-22 ENCOUNTER — Other Ambulatory Visit: Payer: Self-pay | Admitting: *Deleted

## 2013-11-22 ENCOUNTER — Telehealth: Payer: Self-pay | Admitting: Oncology

## 2013-11-22 ENCOUNTER — Ambulatory Visit (HOSPITAL_COMMUNITY)
Admission: RE | Admit: 2013-11-22 | Discharge: 2013-11-22 | Disposition: A | Discharge status: Home / Self Care | Payer: Medicare Other | Source: Ambulatory Visit | Attending: Oncology | Admitting: Oncology

## 2013-11-22 ENCOUNTER — Other Ambulatory Visit: Payer: Self-pay | Admitting: Oncology

## 2013-11-22 ENCOUNTER — Ambulatory Visit (HOSPITAL_BASED_OUTPATIENT_CLINIC_OR_DEPARTMENT_OTHER): Payer: Medicare Other

## 2013-11-22 DIAGNOSIS — D500 Iron deficiency anemia secondary to blood loss (chronic): Secondary | ICD-10-CM

## 2013-11-22 DIAGNOSIS — D509 Iron deficiency anemia, unspecified: Secondary | ICD-10-CM

## 2013-11-22 LAB — CBC WITH DIFFERENTIAL/PLATELET
BASO%: 0.4 % (ref 0.0–2.0)
Basophils Absolute: 0 10*3/uL (ref 0.0–0.1)
EOS%: 4.6 % (ref 0.0–7.0)
Eosinophils Absolute: 0.2 10*3/uL (ref 0.0–0.5)
HEMATOCRIT: 22.6 % — AB (ref 34.8–46.6)
HEMOGLOBIN: 6.6 g/dL — AB (ref 11.6–15.9)
LYMPH#: 0.8 10*3/uL — AB (ref 0.9–3.3)
LYMPH%: 14.9 % (ref 14.0–49.7)
MCH: 21.9 pg — ABNORMAL LOW (ref 25.1–34.0)
MCHC: 29.2 g/dL — AB (ref 31.5–36.0)
MCV: 75.1 fL — ABNORMAL LOW (ref 79.5–101.0)
MONO#: 0.6 10*3/uL (ref 0.1–0.9)
MONO%: 11 % (ref 0.0–14.0)
NEUT#: 3.6 10*3/uL (ref 1.5–6.5)
NEUT%: 69.1 % (ref 38.4–76.8)
PLATELETS: 177 10*3/uL (ref 145–400)
RBC: 3.01 10*6/uL — ABNORMAL LOW (ref 3.70–5.45)
RDW: 18.4 % — ABNORMAL HIGH (ref 11.2–14.5)
WBC: 5.2 10*3/uL (ref 3.9–10.3)
nRBC: 0 % (ref 0–0)

## 2013-11-22 LAB — HOLD TUBE, BLOOD BANK

## 2013-11-22 NOTE — Telephone Encounter (Signed)
Per 10/14 POF, added Blood Transfusion E Chair @12 :15 on 10/15 pt is aware per Amy.... KJ

## 2013-11-23 ENCOUNTER — Ambulatory Visit (HOSPITAL_BASED_OUTPATIENT_CLINIC_OR_DEPARTMENT_OTHER): Payer: Medicare Other

## 2013-11-23 VITALS — BP 152/41 | HR 64 | Temp 98.2°F | Resp 16

## 2013-11-23 DIAGNOSIS — D500 Iron deficiency anemia secondary to blood loss (chronic): Secondary | ICD-10-CM

## 2013-11-23 LAB — PREPARE RBC (CROSSMATCH)

## 2013-11-23 MED ORDER — HEPARIN SOD (PORK) LOCK FLUSH 100 UNIT/ML IV SOLN
250.0000 [IU] | INTRAVENOUS | Status: DC | PRN
  Filled 2013-11-23: qty 5

## 2013-11-23 MED ORDER — SODIUM CHLORIDE 0.9 % IV SOLN
250.0000 mL | Freq: Once | INTRAVENOUS | Status: AC
  Administered 2013-11-23: 250 mL via INTRAVENOUS

## 2013-11-23 NOTE — Patient Instructions (Signed)

## 2013-11-24 LAB — TYPE AND SCREEN
ABO/RH(D): A POS
ANTIBODY SCREEN: NEGATIVE
UNIT DIVISION: 0
UNIT DIVISION: 0

## 2013-12-13 ENCOUNTER — Other Ambulatory Visit: Payer: Self-pay | Admitting: Oncology

## 2013-12-13 ENCOUNTER — Telehealth: Payer: Self-pay | Admitting: *Deleted

## 2013-12-13 DIAGNOSIS — K31811 Angiodysplasia of stomach and duodenum with bleeding: Secondary | ICD-10-CM

## 2013-12-13 NOTE — Telephone Encounter (Signed)
OK  I will put in order for tomorrow.  She fell off radar screen - she needs a follow up appt with me We can do a work in visit  On 11/17 after last afternoon patient and repeat labs at [facility] that day as well.

## 2013-12-13 NOTE — Telephone Encounter (Signed)
Pt called and states she feels her iron is low, she has been feeling bad.  She wants to be set up at the cancer center to have her labs checked. Lat transfusion 10/22 Pt # [id number]

## 2013-12-13 NOTE — Telephone Encounter (Signed)
Patient has a scheduled appointment with you on 11/16 @ 8:45, hope this is good. I will inform her about lab work tomorrow. Thanks.

## 2013-12-13 NOTE — Telephone Encounter (Signed)
That is fine.  I called her re lab and probable transfusion and left voice message.

## 2013-12-14 ENCOUNTER — Ambulatory Visit: Payer: Medicare Other

## 2013-12-14 ENCOUNTER — Ambulatory Visit (HOSPITAL_COMMUNITY)
Admission: RE | Admit: 2013-12-14 | Discharge: 2013-12-14 | Disposition: A | Discharge status: Home / Self Care | Payer: Medicare Other | Source: Ambulatory Visit | Attending: Oncology | Admitting: Oncology

## 2013-12-14 ENCOUNTER — Encounter (HOSPITAL_BASED_OUTPATIENT_CLINIC_OR_DEPARTMENT_OTHER): Payer: Medicare Other | Admitting: Oncology

## 2013-12-14 VITALS — BP 142/48 | HR 60 | Temp 98.2°F | Resp 18

## 2013-12-14 DIAGNOSIS — K31819 Angiodysplasia of stomach and duodenum without bleeding: Secondary | ICD-10-CM

## 2013-12-14 DIAGNOSIS — D500 Iron deficiency anemia secondary to blood loss (chronic): Secondary | ICD-10-CM

## 2013-12-14 DIAGNOSIS — K31811 Angiodysplasia of stomach and duodenum with bleeding: Secondary | ICD-10-CM

## 2013-12-14 DIAGNOSIS — I780 Hereditary hemorrhagic telangiectasia: Secondary | ICD-10-CM

## 2013-12-14 DIAGNOSIS — Q2733 Arteriovenous malformation of digestive system vessel: Secondary | ICD-10-CM

## 2013-12-14 DIAGNOSIS — D509 Iron deficiency anemia, unspecified: Secondary | ICD-10-CM

## 2013-12-14 LAB — CBC WITH DIFFERENTIAL/PLATELET
BASO%: 0.9 % (ref 0.0–2.0)
BASOS ABS: 0 10*3/uL (ref 0.0–0.1)
EOS ABS: 0.3 10*3/uL (ref 0.0–0.5)
EOS%: 5.4 % (ref 0.0–7.0)
HCT: 25.4 % — ABNORMAL LOW (ref 34.8–46.6)
HGB: 7.7 g/dL — ABNORMAL LOW (ref 11.6–15.9)
LYMPH#: 0.6 10*3/uL — AB (ref 0.9–3.3)
LYMPH%: 12 % — ABNORMAL LOW (ref 14.0–49.7)
MCH: 21.5 pg — ABNORMAL LOW (ref 25.1–34.0)
MCHC: 30.3 g/dL — ABNORMAL LOW (ref 31.5–36.0)
MCV: 71 fL — ABNORMAL LOW (ref 79.5–101.0)
MONO#: 0.5 10*3/uL (ref 0.1–0.9)
MONO%: 10.2 % (ref 0.0–14.0)
NEUT%: 71.5 % (ref 38.4–76.8)
NEUTROS ABS: 3.8 10*3/uL (ref 1.5–6.5)
Platelets: 243 10*3/uL (ref 145–400)
RBC: 3.58 10*6/uL — ABNORMAL LOW (ref 3.70–5.45)
RDW: 22.7 % — AB (ref 11.2–14.5)
WBC: 5.3 10*3/uL (ref 3.9–10.3)

## 2013-12-14 LAB — HOLD TUBE, BLOOD BANK

## 2013-12-14 LAB — PREPARE RBC (CROSSMATCH)

## 2013-12-14 LAB — IRON AND TIBC CHCC
%SAT: 4 % — AB (ref 21–57)
IRON: 13 ug/dL — AB (ref 41–142)
TIBC: 325 ug/dL (ref 236–444)
UIBC: 312 ug/dL (ref 120–384)

## 2013-12-14 LAB — FERRITIN CHCC: Ferritin: 5 ng/ml — ABNORMAL LOW (ref 9–269)

## 2013-12-14 NOTE — Patient Instructions (Signed)

## 2013-12-15 LAB — TYPE AND SCREEN
ABO/RH(D): A POS
ANTIBODY SCREEN: NEGATIVE
UNIT DIVISION: 0
Unit division: 0

## 2013-12-20 ENCOUNTER — Encounter (HOSPITAL_COMMUNITY): Payer: Medicare Other

## 2013-12-25 ENCOUNTER — Telehealth: Payer: Self-pay | Admitting: Oncology

## 2013-12-25 ENCOUNTER — Ambulatory Visit (INDEPENDENT_AMBULATORY_CARE_PROVIDER_SITE_OTHER): Payer: Medicare Other | Admitting: Oncology

## 2013-12-25 ENCOUNTER — Other Ambulatory Visit: Payer: Self-pay | Admitting: Oncology

## 2013-12-25 ENCOUNTER — Encounter: Payer: Self-pay | Admitting: Oncology

## 2013-12-25 VITALS — BP 164/95 | HR 66 | Temp 97.7°F | Resp 20 | Ht 61.0 in | Wt 153.7 lb

## 2013-12-25 DIAGNOSIS — K31819 Angiodysplasia of stomach and duodenum without bleeding: Secondary | ICD-10-CM

## 2013-12-25 DIAGNOSIS — K922 Gastrointestinal hemorrhage, unspecified: Secondary | ICD-10-CM

## 2013-12-25 DIAGNOSIS — K2971 Gastritis, unspecified, with bleeding: Secondary | ICD-10-CM

## 2013-12-25 DIAGNOSIS — I780 Hereditary hemorrhagic telangiectasia: Secondary | ICD-10-CM

## 2013-12-25 DIAGNOSIS — Q2733 Arteriovenous malformation of digestive system vessel: Secondary | ICD-10-CM

## 2013-12-25 DIAGNOSIS — D509 Iron deficiency anemia, unspecified: Secondary | ICD-10-CM

## 2013-12-25 DIAGNOSIS — E118 Type 2 diabetes mellitus with unspecified complications: Secondary | ICD-10-CM

## 2013-12-25 NOTE — Patient Instructions (Addendum)
Continue Q month CBC, ferritin labs at  [facility]  Next on Wednesday December 2 Return visit with Dr Darnell Level at [facility] in 6 months Come back for Flu shot!!!!

## 2013-12-25 NOTE — Telephone Encounter (Signed)
sw pt and advised on Dec thru May 2016 appt....pt ok and aware

## 2013-12-25 NOTE — Progress Notes (Signed)
Patient ID: [name], female   DOB: [birth year], 73 y.o.   MRN: [id number] Hematology and Oncology Follow Up Visit  DILLON ALDIS [id number] 19-Mar-1940 73 y.o. 12/25/2013 1:16 PM   Principle Diagnosis: Encounter Diagnoses  Name Primary?  . Gastric AVM Yes  . Osler-Weber-Rendu disease   . Gastrointestinal hemorrhage, unspecified gastritis, unspecified gastrointestinal hemorrhage type   . DM (diabetes mellitus), type 2 with complications      Interim History:   Followup visit for this 73 year old  lady with hereditary hemorrhagic telangiectasias with primary manifestation of her disease being chronic GI blood loss from gastric and, likely intestinal, angiodysplasia. She requires frequent transfusions and parenteral iron supplementation. She does not tolerate oral iron. Transfusion requirements are becoming more frequent.  When I last saw her on August 18, hemoglobin was 7.5. She was given a blood transfusion the next day along with an infusion of parenteral iron. Unfortunately, the pace of her bleeding increased, and she was admitted to [facility] long hospital on August 25 with increasing dyspnea and melanotic stools. Hemoglobin was 7.9 despite blood given just 1 week prior. She was evaluated by gastroenterology and underwent upper endoscopy on August 26 by Dr. Laurence Spates. A number of her gastric AVMs were actively bleeding. Some were cauterized. The largest was clipped.  Despite recent laser fulguration and clipping of gastric AVMs, she continues to lose blood on a continuous basis.hemoglobin down to 6.6 on October 14 and she received blood again. Hemoglobin 7.7 on November 5 and she was transfused at that time as well. She has not had any melena now for about a week. She has had some low-grade epistaxis.she is getting some intermittent abdominal pain.   Medications: reviewed  Allergies:  Allergies  Allergen Reactions  . Aspirin Nausea And Vomiting    Review of Systems: See  history of present illness Remaining ROS negative:   Physical Exam: Blood pressure 164/95, pulse 66, temperature 97.7 F (36.5 C), temperature source Oral, resp. rate 20, height 5\' 1"  (1.549 m), weight 153 lb 11.2 oz (69.718 kg), SpO2 100 %. Wt Readings from Last 3 Encounters:  12/25/13 153 lb 11.2 oz (69.718 kg)  10/03/13 151 lb 3.8 oz (68.6 kg)  09/26/13 155 lb 1.6 oz (70.353 kg)     General appearance: well-nourished African-American woman HENNT: Pharynx no erythema, exudate, mass, or ulcer. No thyromegaly or thyroid nodules Lymph nodes: No cervical, supraclavicular, or axillary lymphadenopathy Breasts Lungs: Clear to auscultation, resonant to percussion throughout Heart: Regular rhythm, 2/6 systolic murmur heard at the left sternal border and second right intercostal space, no gallop, no rub, no click, no edema Abdomen: Soft, nontender, normal bowel sounds, no mass, no organomegaly Extremities: No edema, no calf tenderness Musculoskeletal: no joint deformities GU:  Vascular: Carotid pulses 2+, no bruits, Neurologic: Alert, oriented, PERRLA,, cranial nerves grossly normal, motor strength 5 over 5, reflexes 1+ symmetric, upper body coordination normal, gait normal, Skin: No rash or ecchymosis  Lab Results: CBC W/Diff    Component Value Date/Time   WBC 5.3 12/14/2013 1116   WBC 8.7 10/03/2013 1331   RBC 3.58* 12/14/2013 1116   RBC 2.79* 10/03/2013 1331   RBC 3.19* 02/01/2011 0839   HGB 7.7* 12/14/2013 1116   HGB 8.6* 10/04/2013 0250   HCT 25.4* 12/14/2013 1116   HCT 26.6* 10/04/2013 0250   PLT 243 12/14/2013 1116   PLT 109* 10/03/2013 1331   MCV 71.0* 12/14/2013 1116   MCV 88.2 10/03/2013 1331   MCH  21.5* 12/14/2013 1116   MCH 28.3 10/03/2013 1331   MCHC 30.3* 12/14/2013 1116   MCHC 32.1 10/03/2013 1331   RDW 22.7* 12/14/2013 1116   RDW 22.8* 10/03/2013 1331   LYMPHSABS 0.6* 12/14/2013 1116   LYMPHSABS 0.6* 09/26/2013 0909   MONOABS 0.5 12/14/2013 1116   MONOABS  0.4 09/26/2013 0909   EOSABS 0.3 12/14/2013 1116   EOSABS 0.2 09/26/2013 0909   BASOSABS 0.0 12/14/2013 1116   BASOSABS 0.0 09/26/2013 0909     Chemistry      Component Value Date/Time   NA 146 10/04/2013 0249   NA 142 12/28/2011 0825   K 3.5* 10/04/2013 0249   K 3.9 12/28/2011 0825   CL 110 10/04/2013 0249   CL 111* 12/28/2011 0825   CO2 25 10/04/2013 0249   CO2 26 12/28/2011 0825   BUN 18 10/04/2013 0249   BUN 18.0 12/28/2011 0825   CREATININE 0.77 10/04/2013 0249   CREATININE 0.76 06/06/2013 0947   CREATININE 0.7 12/28/2011 0825      Component Value Date/Time   CALCIUM 8.8 10/04/2013 0249   CALCIUM 9.6 12/28/2011 0825   ALKPHOS 95 10/04/2013 0249   ALKPHOS 93 12/28/2011 0825   AST 34 10/04/2013 0249   AST 42* 12/28/2011 0825   ALT 34 10/04/2013 0249   ALT 37 12/28/2011 0825   BILITOT 0.5 10/04/2013 0249   BILITOT 0.37 12/28/2011 0825       Radiological Studies: No results found.  Impression:  #1. HHT #2. Chronic GI blood loss secondary to #1 #3. Extensive lichen planus rash lower extremities #4. Aortic stenosis #5. Essential hypertension. #6. Type 2 diabetes now insulin-dependent  We will continue to monitor her blood counts and give blood transfusions and parenteral iron as needed   CC: Patient Care Team: Philis Fendt, MD as PCP - General (Internal Medicine)   [name], MD 11/16/20151:16 PM

## 2014-01-10 ENCOUNTER — Ambulatory Visit: Payer: Medicare Other

## 2014-01-10 ENCOUNTER — Ambulatory Visit (HOSPITAL_COMMUNITY)
Admission: RE | Admit: 2014-01-10 | Discharge: 2014-01-10 | Disposition: A | Discharge status: Home / Self Care | Payer: Medicare Other | Source: Ambulatory Visit | Attending: Oncology | Admitting: Oncology

## 2014-01-10 ENCOUNTER — Other Ambulatory Visit: Payer: Self-pay | Admitting: Medical Oncology

## 2014-01-10 ENCOUNTER — Other Ambulatory Visit (HOSPITAL_BASED_OUTPATIENT_CLINIC_OR_DEPARTMENT_OTHER): Payer: Medicare Other

## 2014-01-10 ENCOUNTER — Other Ambulatory Visit: Payer: Self-pay | Admitting: *Deleted

## 2014-01-10 ENCOUNTER — Telehealth: Payer: Self-pay | Admitting: Oncology

## 2014-01-10 ENCOUNTER — Other Ambulatory Visit: Payer: Self-pay | Admitting: Oncology

## 2014-01-10 VITALS — BP 189/58 | HR 62 | Temp 99.3°F | Resp 18

## 2014-01-10 DIAGNOSIS — I780 Hereditary hemorrhagic telangiectasia: Secondary | ICD-10-CM

## 2014-01-10 DIAGNOSIS — D500 Iron deficiency anemia secondary to blood loss (chronic): Secondary | ICD-10-CM

## 2014-01-10 DIAGNOSIS — K31819 Angiodysplasia of stomach and duodenum without bleeding: Secondary | ICD-10-CM

## 2014-01-10 DIAGNOSIS — K31811 Angiodysplasia of stomach and duodenum with bleeding: Secondary | ICD-10-CM | POA: Diagnosis not present

## 2014-01-10 DIAGNOSIS — D509 Iron deficiency anemia, unspecified: Secondary | ICD-10-CM

## 2014-01-10 LAB — CBC WITH DIFFERENTIAL/PLATELET
BASO%: 0.4 % (ref 0.0–2.0)
BASOS ABS: 0 10*3/uL (ref 0.0–0.1)
EOS ABS: 0.4 10*3/uL (ref 0.0–0.5)
EOS%: 7.2 % — ABNORMAL HIGH (ref 0.0–7.0)
HCT: 25.4 % — ABNORMAL LOW (ref 34.8–46.6)
HEMOGLOBIN: 7.7 g/dL — AB (ref 11.6–15.9)
LYMPH%: 13.3 % — ABNORMAL LOW (ref 14.0–49.7)
MCH: 22 pg — ABNORMAL LOW (ref 25.1–34.0)
MCHC: 30.4 g/dL — ABNORMAL LOW (ref 31.5–36.0)
MCV: 72.4 fL — AB (ref 79.5–101.0)
MONO#: 0.5 10*3/uL (ref 0.1–0.9)
MONO%: 9.1 % (ref 0.0–14.0)
NEUT%: 70 % (ref 38.4–76.8)
NEUTROS ABS: 4 10*3/uL (ref 1.5–6.5)
PLATELETS: 231 10*3/uL (ref 145–400)
RBC: 3.51 10*6/uL — ABNORMAL LOW (ref 3.70–5.45)
RDW: 21.6 % — ABNORMAL HIGH (ref 11.2–14.5)
WBC: 5.8 10*3/uL (ref 3.9–10.3)
lymph#: 0.8 10*3/uL — ABNORMAL LOW (ref 0.9–3.3)

## 2014-01-10 LAB — IRON AND TIBC CHCC
%SAT: 4 % — AB (ref 21–57)
Iron: 14 ug/dL — ABNORMAL LOW (ref 41–142)
TIBC: 349 ug/dL (ref 236–444)
UIBC: 334 ug/dL (ref 120–384)

## 2014-01-10 LAB — FERRITIN CHCC: Ferritin: 5 ng/ml — ABNORMAL LOW (ref 9–269)

## 2014-01-10 LAB — PREPARE RBC (CROSSMATCH)

## 2014-01-10 LAB — HOLD TUBE, BLOOD BANK

## 2014-01-10 MED ORDER — SODIUM CHLORIDE 0.9 % IV SOLN: 250.0000 mL | Freq: Once | INTRAVENOUS | Status: DC

## 2014-01-10 NOTE — Telephone Encounter (Signed)
per urgent pof to sch blood trans-sent Dr Darnell Level staff message to adv books were closed @ [facility] on 12/3 & 12/4 [place]

## 2014-01-10 NOTE — Patient Instructions (Signed)

## 2014-01-11 ENCOUNTER — Other Ambulatory Visit: Payer: Self-pay | Admitting: Medical Oncology

## 2014-01-11 LAB — TYPE AND SCREEN
ABO/RH(D): A POS
Antibody Screen: NEGATIVE
UNIT DIVISION: 0
Unit division: 0

## 2014-01-12 ENCOUNTER — Telehealth: Payer: Self-pay | Admitting: *Deleted

## 2014-01-12 NOTE — Telephone Encounter (Signed)
Per staff message and POF I have scheduled appts. I have called patient with appt. [name]

## 2014-01-16 ENCOUNTER — Other Ambulatory Visit: Payer: Self-pay | Admitting: Oncology

## 2014-01-16 ENCOUNTER — Telehealth: Payer: Self-pay | Admitting: *Deleted

## 2014-01-16 ENCOUNTER — Telehealth: Payer: Self-pay | Admitting: Oncology

## 2014-01-16 DIAGNOSIS — I780 Hereditary hemorrhagic telangiectasia: Secondary | ICD-10-CM

## 2014-01-16 NOTE — Telephone Encounter (Signed)
Per staff message and POF I have scheduled appts. Advised scheduler of appts, moved 1/9 to 1/8. JMW

## 2014-01-19 ENCOUNTER — Ambulatory Visit (HOSPITAL_BASED_OUTPATIENT_CLINIC_OR_DEPARTMENT_OTHER): Payer: Medicare Other

## 2014-01-19 DIAGNOSIS — D509 Iron deficiency anemia, unspecified: Secondary | ICD-10-CM

## 2014-01-19 DIAGNOSIS — K31811 Angiodysplasia of stomach and duodenum with bleeding: Secondary | ICD-10-CM

## 2014-01-19 DIAGNOSIS — I780 Hereditary hemorrhagic telangiectasia: Secondary | ICD-10-CM

## 2014-01-19 DIAGNOSIS — K31819 Angiodysplasia of stomach and duodenum without bleeding: Secondary | ICD-10-CM

## 2014-01-19 MED ORDER — SODIUM CHLORIDE 0.9 % IV SOLN
Freq: Once | INTRAVENOUS | Status: AC
  Administered 2014-01-19: 15:00:00 via INTRAVENOUS

## 2014-01-19 MED ORDER — SODIUM CHLORIDE 0.9 % IV SOLN
510.0000 mg | Freq: Once | INTRAVENOUS | Status: AC
  Administered 2014-01-19: 510 mg via INTRAVENOUS
  Filled 2014-01-19: qty 17

## 2014-01-19 NOTE — Patient Instructions (Signed)

## 2014-01-25 ENCOUNTER — Other Ambulatory Visit: Payer: Self-pay | Admitting: Oncology

## 2014-01-25 DIAGNOSIS — I780 Hereditary hemorrhagic telangiectasia: Secondary | ICD-10-CM

## 2014-01-25 DIAGNOSIS — K31819 Angiodysplasia of stomach and duodenum without bleeding: Secondary | ICD-10-CM

## 2014-01-25 DIAGNOSIS — K922 Gastrointestinal hemorrhage, unspecified: Secondary | ICD-10-CM

## 2014-01-25 DIAGNOSIS — D509 Iron deficiency anemia, unspecified: Secondary | ICD-10-CM

## 2014-01-25 NOTE — Progress Notes (Signed)
This encounter was created in error - please disregard.

## 2014-01-26 ENCOUNTER — Telehealth: Payer: Self-pay | Admitting: Oncology

## 2014-01-26 ENCOUNTER — Ambulatory Visit (HOSPITAL_BASED_OUTPATIENT_CLINIC_OR_DEPARTMENT_OTHER): Payer: Medicare Other | Admitting: Lab

## 2014-01-26 ENCOUNTER — Ambulatory Visit (HOSPITAL_BASED_OUTPATIENT_CLINIC_OR_DEPARTMENT_OTHER): Payer: Medicare Other

## 2014-01-26 DIAGNOSIS — K31819 Angiodysplasia of stomach and duodenum without bleeding: Secondary | ICD-10-CM

## 2014-01-26 DIAGNOSIS — K31811 Angiodysplasia of stomach and duodenum with bleeding: Secondary | ICD-10-CM

## 2014-01-26 DIAGNOSIS — D509 Iron deficiency anemia, unspecified: Secondary | ICD-10-CM

## 2014-01-26 DIAGNOSIS — K922 Gastrointestinal hemorrhage, unspecified: Secondary | ICD-10-CM

## 2014-01-26 DIAGNOSIS — I780 Hereditary hemorrhagic telangiectasia: Secondary | ICD-10-CM

## 2014-01-26 LAB — CBC WITH DIFFERENTIAL/PLATELET
BASO%: 0.3 % (ref 0.0–2.0)
Basophils Absolute: 0 10*3/uL (ref 0.0–0.1)
EOS%: 1.3 % (ref 0.0–7.0)
Eosinophils Absolute: 0.1 10*3/uL (ref 0.0–0.5)
HCT: 30.8 % — ABNORMAL LOW (ref 34.8–46.6)
HGB: 9.4 g/dL — ABNORMAL LOW (ref 11.6–15.9)
LYMPH%: 9.1 % — ABNORMAL LOW (ref 14.0–49.7)
MCH: 24.3 pg — AB (ref 25.1–34.0)
MCHC: 30.5 g/dL — AB (ref 31.5–36.0)
MCV: 79.6 fL (ref 79.5–101.0)
MONO#: 0.5 10*3/uL (ref 0.1–0.9)
MONO%: 6.5 % (ref 0.0–14.0)
NEUT#: 6.3 10*3/uL (ref 1.5–6.5)
NEUT%: 82.8 % — AB (ref 38.4–76.8)
Platelets: 178 10*3/uL (ref 145–400)
RBC: 3.87 10*6/uL (ref 3.70–5.45)
RDW: 22 % — AB (ref 11.2–14.5)
WBC: 7.6 10*3/uL (ref 3.9–10.3)
lymph#: 0.7 10*3/uL — ABNORMAL LOW (ref 0.9–3.3)
nRBC: 0 % (ref 0–0)

## 2014-01-26 MED ORDER — SODIUM CHLORIDE 0.9 % IV SOLN
Freq: Once | INTRAVENOUS | Status: AC
  Administered 2014-01-26: 15:00:00 via INTRAVENOUS

## 2014-01-26 MED ORDER — FERUMOXYTOL INJECTION 510 MG/17 ML
510.0000 mg | Freq: Once | INTRAVENOUS | Status: AC
  Administered 2014-01-26: 510 mg via INTRAVENOUS
  Filled 2014-01-26: qty 17

## 2014-01-26 NOTE — Telephone Encounter (Signed)
lvm for pt regarding to added lab b4 tx today.Marland KitchenMarland KitchenMarland Kitchen

## 2014-01-26 NOTE — Patient Instructions (Signed)

## 2014-01-29 ENCOUNTER — Telehealth: Payer: Self-pay | Admitting: *Deleted

## 2014-01-29 NOTE — Telephone Encounter (Signed)
-----   Message from [name], MD sent at 01/26/2014  4:14 PM EST ----- Call pt:  Hb good at 9.4  [facility]

## 2014-01-29 NOTE — Telephone Encounter (Signed)
Called pt - no answer. Left message her Hbg good @ 9.4 and Merry Christmas per Dr [name]. But to call if she has any questions.

## 2014-02-07 ENCOUNTER — Other Ambulatory Visit (HOSPITAL_BASED_OUTPATIENT_CLINIC_OR_DEPARTMENT_OTHER): Payer: Medicare Other

## 2014-02-07 DIAGNOSIS — D509 Iron deficiency anemia, unspecified: Secondary | ICD-10-CM

## 2014-02-07 DIAGNOSIS — K31819 Angiodysplasia of stomach and duodenum without bleeding: Secondary | ICD-10-CM

## 2014-02-07 DIAGNOSIS — I780 Hereditary hemorrhagic telangiectasia: Secondary | ICD-10-CM

## 2014-02-07 DIAGNOSIS — K31811 Angiodysplasia of stomach and duodenum with bleeding: Secondary | ICD-10-CM

## 2014-02-07 LAB — CBC WITH DIFFERENTIAL/PLATELET
BASO%: 0.4 % (ref 0.0–2.0)
Basophils Absolute: 0 10*3/uL (ref 0.0–0.1)
EOS%: 5.8 % (ref 0.0–7.0)
Eosinophils Absolute: 0.3 10*3/uL (ref 0.0–0.5)
HCT: 30.9 % — ABNORMAL LOW (ref 34.8–46.6)
HEMOGLOBIN: 9.9 g/dL — AB (ref 11.6–15.9)
LYMPH#: 0.5 10*3/uL — AB (ref 0.9–3.3)
LYMPH%: 11.8 % — ABNORMAL LOW (ref 14.0–49.7)
MCH: 27.2 pg (ref 25.1–34.0)
MCHC: 32 g/dL (ref 31.5–36.0)
MCV: 84.9 fL (ref 79.5–101.0)
MONO#: 0.5 10*3/uL (ref 0.1–0.9)
MONO%: 10.9 % (ref 0.0–14.0)
NEUT#: 3.2 10*3/uL (ref 1.5–6.5)
NEUT%: 71.1 % (ref 38.4–76.8)
Platelets: 238 10*3/uL (ref 145–400)
RBC: 3.64 10*6/uL — ABNORMAL LOW (ref 3.70–5.45)
RDW: 22.6 % — AB (ref 11.2–14.5)
WBC: 4.5 10*3/uL (ref 3.9–10.3)

## 2014-02-07 LAB — FERRITIN CHCC: Ferritin: 172 ng/ml (ref 9–269)

## 2014-02-07 LAB — IRON AND TIBC CHCC
%SAT: 33 % (ref 21–57)
IRON: 84 ug/dL (ref 41–142)
TIBC: 254 ug/dL (ref 236–444)
UIBC: 170 ug/dL (ref 120–384)

## 2014-02-07 LAB — HOLD TUBE, BLOOD BANK

## 2014-02-07 NOTE — Progress Notes (Signed)
Called pt, she states her back is aching a [place], ask her to call her primary physician, she is agreeable

## 2014-02-13 ENCOUNTER — Telehealth: Payer: Self-pay | Admitting: Oncology

## 2014-02-13 DIAGNOSIS — D509 Iron deficiency anemia, unspecified: Secondary | ICD-10-CM | POA: Diagnosis not present

## 2014-02-13 DIAGNOSIS — E1140 Type 2 diabetes mellitus with diabetic neuropathy, unspecified: Secondary | ICD-10-CM | POA: Diagnosis not present

## 2014-02-13 DIAGNOSIS — I10 Essential (primary) hypertension: Secondary | ICD-10-CM | POA: Diagnosis not present

## 2014-02-13 DIAGNOSIS — N3000 Acute cystitis without hematuria: Secondary | ICD-10-CM | POA: Diagnosis not present

## 2014-02-13 NOTE — Telephone Encounter (Signed)
pt currently on schedule for fera 1/8 and 1/15. pt called wanting to r/s 1/8 due to she has to attend a funeral. checked w/inf manager and ok for pt to do fera 1/7 and keep 1/15 as scheduled. pt aware of new date for 1/7 and that 1/15 will remain as is.

## 2014-02-15 ENCOUNTER — Ambulatory Visit (HOSPITAL_BASED_OUTPATIENT_CLINIC_OR_DEPARTMENT_OTHER): Payer: Medicare Other

## 2014-02-15 DIAGNOSIS — K31819 Angiodysplasia of stomach and duodenum without bleeding: Secondary | ICD-10-CM

## 2014-02-15 DIAGNOSIS — D509 Iron deficiency anemia, unspecified: Secondary | ICD-10-CM

## 2014-02-15 DIAGNOSIS — I780 Hereditary hemorrhagic telangiectasia: Secondary | ICD-10-CM

## 2014-02-15 DIAGNOSIS — K31811 Angiodysplasia of stomach and duodenum with bleeding: Secondary | ICD-10-CM

## 2014-02-15 MED ORDER — SODIUM CHLORIDE 0.9 % IV SOLN
Freq: Once | INTRAVENOUS | Status: AC
  Administered 2014-02-15: 11:00:00 via INTRAVENOUS

## 2014-02-15 MED ORDER — SODIUM CHLORIDE 0.9 % IV SOLN
510.0000 mg | Freq: Once | INTRAVENOUS | Status: AC
  Administered 2014-02-15: 510 mg via INTRAVENOUS
  Filled 2014-02-15: qty 17

## 2014-02-15 NOTE — Patient Instructions (Signed)

## 2014-02-16 ENCOUNTER — Ambulatory Visit: Payer: Medicare Other

## 2014-02-21 ENCOUNTER — Emergency Department (HOSPITAL_COMMUNITY)
Admission: EM | Admit: 2014-02-21 | Discharge: 2014-02-21 | Discharge status: Against Medical Advice | Payer: Medicare Other | Attending: Emergency Medicine | Admitting: Emergency Medicine

## 2014-02-21 ENCOUNTER — Encounter (HOSPITAL_COMMUNITY): Payer: Self-pay | Admitting: Emergency Medicine

## 2014-02-21 DIAGNOSIS — I509 Heart failure, unspecified: Secondary | ICD-10-CM | POA: Diagnosis not present

## 2014-02-21 DIAGNOSIS — R011 Cardiac murmur, unspecified: Secondary | ICD-10-CM | POA: Diagnosis not present

## 2014-02-21 DIAGNOSIS — I10 Essential (primary) hypertension: Secondary | ICD-10-CM | POA: Diagnosis not present

## 2014-02-21 DIAGNOSIS — E119 Type 2 diabetes mellitus without complications: Secondary | ICD-10-CM | POA: Insufficient documentation

## 2014-02-21 DIAGNOSIS — M549 Dorsalgia, unspecified: Secondary | ICD-10-CM | POA: Insufficient documentation

## 2014-02-21 DIAGNOSIS — R109 Unspecified abdominal pain: Secondary | ICD-10-CM | POA: Diagnosis not present

## 2014-02-21 NOTE — ED Notes (Signed)
Pt c/o all around back pain and abd pain x 2 weeks, went to PCP put on antibiotics but pt states pain is not better. Denies n/v/d or urinary problems.

## 2014-02-21 NOTE — ED Notes (Signed)
No pt answer when called to update vitals

## 2014-02-21 NOTE — ED Notes (Signed)
No answer when called to move patient back to room.

## 2014-02-22 DIAGNOSIS — M545 Low back pain: Secondary | ICD-10-CM | POA: Diagnosis not present

## 2014-02-22 DIAGNOSIS — I10 Essential (primary) hypertension: Secondary | ICD-10-CM | POA: Diagnosis not present

## 2014-02-22 DIAGNOSIS — E1140 Type 2 diabetes mellitus with diabetic neuropathy, unspecified: Secondary | ICD-10-CM | POA: Diagnosis not present

## 2014-02-22 DIAGNOSIS — D509 Iron deficiency anemia, unspecified: Secondary | ICD-10-CM | POA: Diagnosis not present

## 2014-02-22 DIAGNOSIS — K5900 Constipation, unspecified: Secondary | ICD-10-CM | POA: Diagnosis not present

## 2014-02-23 ENCOUNTER — Ambulatory Visit (HOSPITAL_BASED_OUTPATIENT_CLINIC_OR_DEPARTMENT_OTHER): Payer: Medicare Other

## 2014-02-23 DIAGNOSIS — I780 Hereditary hemorrhagic telangiectasia: Secondary | ICD-10-CM

## 2014-02-23 DIAGNOSIS — K31819 Angiodysplasia of stomach and duodenum without bleeding: Secondary | ICD-10-CM

## 2014-02-23 DIAGNOSIS — D509 Iron deficiency anemia, unspecified: Secondary | ICD-10-CM

## 2014-02-23 MED ORDER — SODIUM CHLORIDE 0.9 % IV SOLN
Freq: Once | INTRAVENOUS | Status: AC
  Administered 2014-02-23: 12:00:00 via INTRAVENOUS

## 2014-02-23 MED ORDER — SODIUM CHLORIDE 0.9 % IV SOLN
510.0000 mg | Freq: Once | INTRAVENOUS | Status: AC
  Administered 2014-02-23: 510 mg via INTRAVENOUS
  Filled 2014-02-23: qty 17

## 2014-02-23 NOTE — Patient Instructions (Signed)

## 2014-03-07 ENCOUNTER — Other Ambulatory Visit: Payer: Self-pay | Admitting: Oncology

## 2014-03-07 ENCOUNTER — Other Ambulatory Visit (HOSPITAL_BASED_OUTPATIENT_CLINIC_OR_DEPARTMENT_OTHER): Payer: Medicare Other

## 2014-03-07 DIAGNOSIS — K31811 Angiodysplasia of stomach and duodenum with bleeding: Secondary | ICD-10-CM

## 2014-03-07 DIAGNOSIS — E118 Type 2 diabetes mellitus with unspecified complications: Secondary | ICD-10-CM | POA: Diagnosis not present

## 2014-03-07 DIAGNOSIS — K922 Gastrointestinal hemorrhage, unspecified: Secondary | ICD-10-CM

## 2014-03-07 DIAGNOSIS — I780 Hereditary hemorrhagic telangiectasia: Secondary | ICD-10-CM

## 2014-03-07 DIAGNOSIS — D509 Iron deficiency anemia, unspecified: Secondary | ICD-10-CM

## 2014-03-07 DIAGNOSIS — K31819 Angiodysplasia of stomach and duodenum without bleeding: Secondary | ICD-10-CM

## 2014-03-07 LAB — CBC WITH DIFFERENTIAL/PLATELET
BASO%: 0.5 % (ref 0.0–2.0)
Basophils Absolute: 0 10*3/uL (ref 0.0–0.1)
EOS%: 4.6 % (ref 0.0–7.0)
Eosinophils Absolute: 0.2 10*3/uL (ref 0.0–0.5)
HEMATOCRIT: 27.5 % — AB (ref 34.8–46.6)
HEMOGLOBIN: 8.7 g/dL — AB (ref 11.6–15.9)
LYMPH%: 6.7 % — ABNORMAL LOW (ref 14.0–49.7)
MCH: 29.3 pg (ref 25.1–34.0)
MCHC: 31.5 g/dL (ref 31.5–36.0)
MCV: 93.2 fL (ref 79.5–101.0)
MONO#: 0.4 10*3/uL (ref 0.1–0.9)
MONO%: 8.8 % (ref 0.0–14.0)
NEUT%: 79.4 % — AB (ref 38.4–76.8)
NEUTROS ABS: 3.9 10*3/uL (ref 1.5–6.5)
PLATELETS: 242 10*3/uL (ref 145–400)
RBC: 2.95 10*6/uL — AB (ref 3.70–5.45)
RDW: 20.8 % — AB (ref 11.2–14.5)
WBC: 4.9 10*3/uL (ref 3.9–10.3)
lymph#: 0.3 10*3/uL — ABNORMAL LOW (ref 0.9–3.3)

## 2014-03-07 LAB — IRON AND TIBC CHCC
%SAT: 13 % — AB (ref 21–57)
IRON: 30 ug/dL — AB (ref 41–142)
TIBC: 226 ug/dL — ABNORMAL LOW (ref 236–444)
UIBC: 196 ug/dL (ref 120–384)

## 2014-03-07 LAB — FERRITIN CHCC: Ferritin: 137 ng/ml (ref 9–269)

## 2014-03-07 LAB — HOLD TUBE, BLOOD BANK

## 2014-03-08 ENCOUNTER — Telehealth: Payer: Self-pay | Admitting: *Deleted

## 2014-03-08 NOTE — Telephone Encounter (Signed)
Called pt - no answer. Left message to call me back.

## 2014-03-08 NOTE — Telephone Encounter (Signed)
-----   Message from [name], MD sent at 03/07/2014  1:54 PM EST ----- Call pt: red cells drifting down but OK for now.  Has she seen any blood in her bowel movements? Iron levels OK right now I want to check a blood count again in 2 weeks on 2/10 and not wait a month she can have lab done at cancer center if more convenient for her

## 2014-03-08 NOTE — Telephone Encounter (Signed)
Talked to pt - pt informed her red cells are drifting down but ok for now and iron levels are also ok per Dr [name]. Stated she has not noticed any blood in her stools. Agreeable to have blood count check on 2/10 at the cancer ctr; best time is 10AM.  Talked to [facility] - her appt has been scheduled for 1030AM; pt called/informed.

## 2014-03-08 NOTE — Telephone Encounter (Signed)
Per RN from Dr. Azucena Freed office, I have scheduled lab appt. RN to call the patient

## 2014-03-13 DIAGNOSIS — M545 Low back pain: Secondary | ICD-10-CM | POA: Diagnosis not present

## 2014-03-13 DIAGNOSIS — I10 Essential (primary) hypertension: Secondary | ICD-10-CM | POA: Diagnosis not present

## 2014-03-13 DIAGNOSIS — D509 Iron deficiency anemia, unspecified: Secondary | ICD-10-CM | POA: Diagnosis not present

## 2014-03-13 DIAGNOSIS — E1140 Type 2 diabetes mellitus with diabetic neuropathy, unspecified: Secondary | ICD-10-CM | POA: Diagnosis not present

## 2014-03-13 DIAGNOSIS — E559 Vitamin D deficiency, unspecified: Secondary | ICD-10-CM | POA: Diagnosis not present

## 2014-03-21 ENCOUNTER — Other Ambulatory Visit (HOSPITAL_BASED_OUTPATIENT_CLINIC_OR_DEPARTMENT_OTHER): Payer: Medicare Other

## 2014-03-21 ENCOUNTER — Other Ambulatory Visit: Payer: Self-pay | Admitting: *Deleted

## 2014-03-21 ENCOUNTER — Ambulatory Visit (HOSPITAL_BASED_OUTPATIENT_CLINIC_OR_DEPARTMENT_OTHER): Payer: Medicare Other

## 2014-03-21 ENCOUNTER — Ambulatory Visit (HOSPITAL_COMMUNITY)
Admission: RE | Admit: 2014-03-21 | Discharge: 2014-03-21 | Disposition: A | Discharge status: Home / Self Care | Payer: Medicare Other | Source: Ambulatory Visit | Attending: Oncology | Admitting: Oncology

## 2014-03-21 ENCOUNTER — Other Ambulatory Visit: Payer: Self-pay | Admitting: Oncology

## 2014-03-21 VITALS — BP 139/55 | HR 64 | Temp 97.7°F | Resp 18

## 2014-03-21 DIAGNOSIS — I780 Hereditary hemorrhagic telangiectasia: Secondary | ICD-10-CM | POA: Diagnosis not present

## 2014-03-21 DIAGNOSIS — D500 Iron deficiency anemia secondary to blood loss (chronic): Secondary | ICD-10-CM | POA: Diagnosis not present

## 2014-03-21 DIAGNOSIS — D509 Iron deficiency anemia, unspecified: Secondary | ICD-10-CM

## 2014-03-21 DIAGNOSIS — K31811 Angiodysplasia of stomach and duodenum with bleeding: Secondary | ICD-10-CM

## 2014-03-21 DIAGNOSIS — K31819 Angiodysplasia of stomach and duodenum without bleeding: Secondary | ICD-10-CM

## 2014-03-21 LAB — CBC WITH DIFFERENTIAL/PLATELET
BASO%: 0.5 % (ref 0.0–2.0)
BASOS ABS: 0 10*3/uL (ref 0.0–0.1)
EOS ABS: 0.1 10*3/uL (ref 0.0–0.5)
EOS%: 2.1 % (ref 0.0–7.0)
HEMATOCRIT: 21.9 % — AB (ref 34.8–46.6)
HEMOGLOBIN: 6.7 g/dL — AB (ref 11.6–15.9)
LYMPH#: 0.5 10*3/uL — AB (ref 0.9–3.3)
LYMPH%: 10.4 % — ABNORMAL LOW (ref 14.0–49.7)
MCH: 26.2 pg (ref 25.1–34.0)
MCHC: 30.6 g/dL — ABNORMAL LOW (ref 31.5–36.0)
MCV: 85.6 fL (ref 79.5–101.0)
MONO#: 0.6 10*3/uL (ref 0.1–0.9)
MONO%: 12.1 % (ref 0.0–14.0)
NEUT%: 74.9 % (ref 38.4–76.8)
NEUTROS ABS: 3.6 10*3/uL (ref 1.5–6.5)
Platelets: 202 10*3/uL (ref 145–400)
RBC: 2.56 10*6/uL — ABNORMAL LOW (ref 3.70–5.45)
RDW: 20.9 % — ABNORMAL HIGH (ref 11.2–14.5)
WBC: 4.8 10*3/uL (ref 3.9–10.3)

## 2014-03-21 LAB — IRON AND TIBC CHCC
%SAT: 7 % — ABNORMAL LOW (ref 21–57)
Iron: 15 ug/dL — ABNORMAL LOW (ref 41–142)
TIBC: 215 ug/dL — AB (ref 236–444)
UIBC: 201 ug/dL (ref 120–384)

## 2014-03-21 LAB — HOLD TUBE, BLOOD BANK

## 2014-03-21 LAB — FERRITIN CHCC: FERRITIN: 45 ng/mL (ref 9–269)

## 2014-03-21 LAB — PREPARE RBC (CROSSMATCH)

## 2014-03-21 MED ORDER — SODIUM CHLORIDE 0.9 % IV SOLN
250.0000 mL | Freq: Once | INTRAVENOUS | Status: AC
  Administered 2014-03-21: 250 mL via INTRAVENOUS

## 2014-03-21 NOTE — Patient Instructions (Signed)

## 2014-03-22 ENCOUNTER — Other Ambulatory Visit: Payer: Self-pay | Admitting: Oncology

## 2014-03-22 LAB — TYPE AND SCREEN
ABO/RH(D): A POS
Antibody Screen: NEGATIVE
UNIT DIVISION: 0
Unit division: 0

## 2014-03-22 NOTE — Progress Notes (Signed)
Late Note:  Unable to document blood administration for unit # [id number] under blood administration flowsheet.  Unit started at 1420: Verified by Mayra Reel, RN.  Initiated at rate of 120 ml/hr for 30 ml.  See vital signs documented under blood administration flowsheet.  Rate increased to 185 ml/hr at 1435 for remainder of infusion.  Infusion completed at 1547 with a total volume of 252 ml infused.  Vital signs documented.  Pt tolerated well.

## 2014-04-04 ENCOUNTER — Telehealth: Payer: Self-pay | Admitting: *Deleted

## 2014-04-04 ENCOUNTER — Other Ambulatory Visit (HOSPITAL_BASED_OUTPATIENT_CLINIC_OR_DEPARTMENT_OTHER): Payer: Medicare Other

## 2014-04-04 DIAGNOSIS — K922 Gastrointestinal hemorrhage, unspecified: Secondary | ICD-10-CM

## 2014-04-04 DIAGNOSIS — I780 Hereditary hemorrhagic telangiectasia: Secondary | ICD-10-CM | POA: Diagnosis not present

## 2014-04-04 DIAGNOSIS — D509 Iron deficiency anemia, unspecified: Secondary | ICD-10-CM

## 2014-04-04 DIAGNOSIS — K31819 Angiodysplasia of stomach and duodenum without bleeding: Secondary | ICD-10-CM

## 2014-04-04 LAB — COMPREHENSIVE METABOLIC PANEL (CC13)
ALK PHOS: 140 U/L (ref 40–150)
ALT: 25 U/L (ref 0–55)
AST: 30 U/L (ref 5–34)
Albumin: 3.3 g/dL — ABNORMAL LOW (ref 3.5–5.0)
Anion Gap: 9 mEq/L (ref 3–11)
BILIRUBIN TOTAL: 0.43 mg/dL (ref 0.20–1.20)
BUN: 16.9 mg/dL (ref 7.0–26.0)
CO2: 26 mEq/L (ref 22–29)
Calcium: 9.3 mg/dL (ref 8.4–10.4)
Chloride: 109 mEq/L (ref 98–109)
Creatinine: 0.9 mg/dL (ref 0.6–1.1)
EGFR: 76 mL/min/{1.73_m2} — ABNORMAL LOW (ref >90)
Glucose: 156 mg/dl — ABNORMAL HIGH (ref 70–140)
Potassium: 4.5 mEq/L (ref 3.5–5.1)
Sodium: 144 mEq/L (ref 136–145)
Total Protein: 6.4 g/dL (ref 6.4–8.3)

## 2014-04-04 LAB — CBC WITH DIFFERENTIAL/PLATELET
BASO%: 0.2 % (ref 0.0–2.0)
Basophils Absolute: 0 10*3/uL (ref 0.0–0.1)
EOS ABS: 0.3 10*3/uL (ref 0.0–0.5)
EOS%: 6.9 % (ref 0.0–7.0)
HEMATOCRIT: 28.1 % — AB (ref 34.8–46.6)
HGB: 8.7 g/dL — ABNORMAL LOW (ref 11.6–15.9)
LYMPH%: 14.1 % (ref 14.0–49.7)
MCH: 25.6 pg (ref 25.1–34.0)
MCHC: 31 g/dL — AB (ref 31.5–36.0)
MCV: 82.6 fL (ref 79.5–101.0)
MONO#: 0.4 10*3/uL (ref 0.1–0.9)
MONO%: 8.6 % (ref 0.0–14.0)
NEUT#: 2.9 10*3/uL (ref 1.5–6.5)
NEUT%: 70.2 % (ref 38.4–76.8)
PLATELETS: 244 10*3/uL (ref 145–400)
RBC: 3.4 10*6/uL — ABNORMAL LOW (ref 3.70–5.45)
RDW: 16.7 % — ABNORMAL HIGH (ref 11.2–14.5)
WBC: 4.2 10*3/uL (ref 3.9–10.3)
lymph#: 0.6 10*3/uL — ABNORMAL LOW (ref 0.9–3.3)

## 2014-04-04 LAB — IRON AND TIBC CHCC
%SAT: 8 % — ABNORMAL LOW (ref 21–57)
Iron: 23 ug/dL — ABNORMAL LOW (ref 41–142)
TIBC: 283 ug/dL (ref 236–444)
UIBC: 260 ug/dL (ref 120–384)

## 2014-04-04 LAB — FERRITIN CHCC: FERRITIN: 23 ng/mL (ref 9–269)

## 2014-04-04 LAB — HOLD TUBE, BLOOD BANK

## 2014-04-04 NOTE — Telephone Encounter (Signed)
Per message from Dr. [name] I have scheduled appts. Dr. [name] notified of appts

## 2014-04-06 ENCOUNTER — Ambulatory Visit: Payer: Medicare Other

## 2014-04-11 ENCOUNTER — Ambulatory Visit (HOSPITAL_COMMUNITY)
Admission: RE | Admit: 2014-04-11 | Discharge: 2014-04-11 | Disposition: A | Discharge status: Home / Self Care | Payer: Medicare Other | Source: Ambulatory Visit | Attending: Internal Medicine | Admitting: Internal Medicine

## 2014-04-11 ENCOUNTER — Other Ambulatory Visit (HOSPITAL_COMMUNITY): Payer: Self-pay | Admitting: Internal Medicine

## 2014-04-11 DIAGNOSIS — D509 Iron deficiency anemia, unspecified: Secondary | ICD-10-CM | POA: Diagnosis not present

## 2014-04-11 DIAGNOSIS — M545 Low back pain: Secondary | ICD-10-CM

## 2014-04-11 DIAGNOSIS — E1140 Type 2 diabetes mellitus with diabetic neuropathy, unspecified: Secondary | ICD-10-CM | POA: Diagnosis not present

## 2014-04-11 DIAGNOSIS — I10 Essential (primary) hypertension: Secondary | ICD-10-CM | POA: Diagnosis not present

## 2014-04-11 DIAGNOSIS — M5136 Other intervertebral disc degeneration, lumbar region: Secondary | ICD-10-CM | POA: Insufficient documentation

## 2014-04-12 ENCOUNTER — Ambulatory Visit (HOSPITAL_BASED_OUTPATIENT_CLINIC_OR_DEPARTMENT_OTHER): Payer: Medicare Other

## 2014-04-12 ENCOUNTER — Other Ambulatory Visit: Payer: Self-pay | Admitting: Oncology

## 2014-04-12 ENCOUNTER — Other Ambulatory Visit (HOSPITAL_BASED_OUTPATIENT_CLINIC_OR_DEPARTMENT_OTHER): Payer: Medicare Other

## 2014-04-12 ENCOUNTER — Ambulatory Visit (HOSPITAL_COMMUNITY)
Admission: RE | Admit: 2014-04-12 | Discharge: 2014-04-12 | Disposition: A | Discharge status: Home / Self Care | Payer: Medicare Other | Source: Ambulatory Visit | Attending: Oncology | Admitting: Oncology

## 2014-04-12 ENCOUNTER — Telehealth: Payer: Self-pay | Admitting: *Deleted

## 2014-04-12 VITALS — BP 136/57 | HR 66 | Temp 98.8°F | Resp 18

## 2014-04-12 DIAGNOSIS — I780 Hereditary hemorrhagic telangiectasia: Secondary | ICD-10-CM

## 2014-04-12 DIAGNOSIS — K31819 Angiodysplasia of stomach and duodenum without bleeding: Secondary | ICD-10-CM

## 2014-04-12 DIAGNOSIS — K921 Melena: Secondary | ICD-10-CM

## 2014-04-12 DIAGNOSIS — D500 Iron deficiency anemia secondary to blood loss (chronic): Secondary | ICD-10-CM | POA: Diagnosis not present

## 2014-04-12 DIAGNOSIS — K31811 Angiodysplasia of stomach and duodenum with bleeding: Secondary | ICD-10-CM

## 2014-04-12 DIAGNOSIS — Q2733 Arteriovenous malformation of digestive system vessel: Secondary | ICD-10-CM | POA: Diagnosis not present

## 2014-04-12 DIAGNOSIS — K922 Gastrointestinal hemorrhage, unspecified: Secondary | ICD-10-CM | POA: Diagnosis not present

## 2014-04-12 DIAGNOSIS — E118 Type 2 diabetes mellitus with unspecified complications: Secondary | ICD-10-CM | POA: Diagnosis not present

## 2014-04-12 LAB — CBC WITH DIFFERENTIAL/PLATELET
BASO%: 0.3 % (ref 0.0–2.0)
Basophils Absolute: 0 10*3/uL (ref 0.0–0.1)
EOS%: 7.8 % — AB (ref 0.0–7.0)
Eosinophils Absolute: 0.5 10*3/uL (ref 0.0–0.5)
HCT: 20.9 % — ABNORMAL LOW (ref 34.8–46.6)
HGB: 6.6 g/dL — CL (ref 11.6–15.9)
LYMPH%: 13.6 % — ABNORMAL LOW (ref 14.0–49.7)
MCH: 25.1 pg (ref 25.1–34.0)
MCHC: 31.6 g/dL (ref 31.5–36.0)
MCV: 79.5 fL (ref 79.5–101.0)
MONO#: 0.5 10*3/uL (ref 0.1–0.9)
MONO%: 8.3 % (ref 0.0–14.0)
NEUT#: 4.1 10*3/uL (ref 1.5–6.5)
NEUT%: 70 % (ref 38.4–76.8)
PLATELETS: 210 10*3/uL (ref 145–400)
RBC: 2.63 10*6/uL — ABNORMAL LOW (ref 3.70–5.45)
RDW: 16.5 % — ABNORMAL HIGH (ref 11.2–14.5)
WBC: 5.8 10*3/uL (ref 3.9–10.3)
lymph#: 0.8 10*3/uL — ABNORMAL LOW (ref 0.9–3.3)
nRBC: 0 % (ref 0–0)

## 2014-04-12 LAB — PREPARE RBC (CROSSMATCH)

## 2014-04-12 LAB — HOLD TUBE, BLOOD BANK

## 2014-04-12 MED ORDER — SODIUM CHLORIDE 0.9 % IV SOLN
250.0000 mL | Freq: Once | INTRAVENOUS | Status: AC
  Administered 2014-04-12: 250 mL via INTRAVENOUS

## 2014-04-12 MED ORDER — ALUM & MAG HYDROXIDE-SIMETH 200-200-20 MG/5ML PO SUSP
30.0000 mL | Freq: Once | ORAL | Status: AC
  Administered 2014-04-12: 30 mL via ORAL
  Filled 2014-04-12: qty 30

## 2014-04-12 NOTE — Patient Instructions (Signed)

## 2014-04-12 NOTE — Telephone Encounter (Signed)
Patient called to say she is sure her blood is low again, meaning her hemoglobin  Her legs are weak She saw her PCP yesterday, Dr. Jeanie Cooks, and blood was taken.  She is not due to labs here until 05/02/14.  Attempted to obtain results from Dr. Santiago Bur office to no avail. Called Dr. [name] and he asked that we schedule her to come in for a CBC and Type and Hold. Called patient.  She will come in at 1:00 pm.

## 2014-04-13 ENCOUNTER — Ambulatory Visit: Payer: Medicare Other

## 2014-04-13 ENCOUNTER — Ambulatory Visit (HOSPITAL_BASED_OUTPATIENT_CLINIC_OR_DEPARTMENT_OTHER): Payer: Medicare Other

## 2014-04-13 VITALS — BP 127/44 | HR 68 | Temp 98.4°F | Resp 18

## 2014-04-13 DIAGNOSIS — K31811 Angiodysplasia of stomach and duodenum with bleeding: Secondary | ICD-10-CM

## 2014-04-13 DIAGNOSIS — D500 Iron deficiency anemia secondary to blood loss (chronic): Secondary | ICD-10-CM

## 2014-04-13 DIAGNOSIS — I780 Hereditary hemorrhagic telangiectasia: Secondary | ICD-10-CM

## 2014-04-13 MED ORDER — SODIUM CHLORIDE 0.9 % IV SOLN
250.0000 mL | Freq: Once | INTRAVENOUS | Status: AC
  Administered 2014-04-13: 250 mL via INTRAVENOUS

## 2014-04-13 NOTE — Patient Instructions (Signed)

## 2014-04-15 LAB — TYPE AND SCREEN
ABO/RH(D): A POS
Antibody Screen: NEGATIVE
UNIT DIVISION: 0
Unit division: 0

## 2014-04-25 ENCOUNTER — Telehealth: Payer: Self-pay | Admitting: *Deleted

## 2014-04-25 ENCOUNTER — Other Ambulatory Visit: Payer: Self-pay | Admitting: Oncology

## 2014-04-25 DIAGNOSIS — D509 Iron deficiency anemia, unspecified: Secondary | ICD-10-CM | POA: Diagnosis not present

## 2014-04-25 DIAGNOSIS — M5136 Other intervertebral disc degeneration, lumbar region: Secondary | ICD-10-CM | POA: Diagnosis not present

## 2014-04-25 DIAGNOSIS — E1140 Type 2 diabetes mellitus with diabetic neuropathy, unspecified: Secondary | ICD-10-CM | POA: Diagnosis not present

## 2014-04-25 DIAGNOSIS — K5900 Constipation, unspecified: Secondary | ICD-10-CM | POA: Diagnosis not present

## 2014-04-25 DIAGNOSIS — I780 Hereditary hemorrhagic telangiectasia: Secondary | ICD-10-CM

## 2014-04-25 DIAGNOSIS — K219 Gastro-esophageal reflux disease without esophagitis: Secondary | ICD-10-CM | POA: Diagnosis not present

## 2014-04-25 DIAGNOSIS — K31811 Angiodysplasia of stomach and duodenum with bleeding: Secondary | ICD-10-CM

## 2014-04-25 NOTE — Telephone Encounter (Signed)
Pt called stating she wanted to come in for a blood transfusion. She states she saw her medical doctor and was told her blood count was low and she was pale.  Pt # [id number]

## 2014-04-25 NOTE — Telephone Encounter (Signed)
Talked with Dr [name] and he arranged for a transfusion at the [facility] tomorrow at 12:30 Pt informed.

## 2014-04-26 ENCOUNTER — Other Ambulatory Visit (HOSPITAL_BASED_OUTPATIENT_CLINIC_OR_DEPARTMENT_OTHER): Payer: Medicare Other

## 2014-04-26 ENCOUNTER — Ambulatory Visit (HOSPITAL_BASED_OUTPATIENT_CLINIC_OR_DEPARTMENT_OTHER): Payer: Medicare Other

## 2014-04-26 VITALS — BP 149/53 | HR 69 | Temp 98.0°F | Resp 18

## 2014-04-26 DIAGNOSIS — I780 Hereditary hemorrhagic telangiectasia: Secondary | ICD-10-CM

## 2014-04-26 DIAGNOSIS — K922 Gastrointestinal hemorrhage, unspecified: Secondary | ICD-10-CM

## 2014-04-26 DIAGNOSIS — K31811 Angiodysplasia of stomach and duodenum with bleeding: Secondary | ICD-10-CM

## 2014-04-26 DIAGNOSIS — D500 Iron deficiency anemia secondary to blood loss (chronic): Secondary | ICD-10-CM

## 2014-04-26 DIAGNOSIS — K31819 Angiodysplasia of stomach and duodenum without bleeding: Secondary | ICD-10-CM

## 2014-04-26 DIAGNOSIS — D509 Iron deficiency anemia, unspecified: Secondary | ICD-10-CM

## 2014-04-26 LAB — CBC WITH DIFFERENTIAL/PLATELET
BASO%: 0.3 % (ref 0.0–2.0)
Basophils Absolute: 0 10*3/uL (ref 0.0–0.1)
EOS ABS: 0.2 10*3/uL (ref 0.0–0.5)
EOS%: 2.4 % (ref 0.0–7.0)
HEMATOCRIT: 18.8 % — AB (ref 34.8–46.6)
HGB: 5.6 g/dL — CL (ref 11.6–15.9)
LYMPH#: 0.7 10*3/uL — AB (ref 0.9–3.3)
LYMPH%: 9.7 % — ABNORMAL LOW (ref 14.0–49.7)
MCH: 23.6 pg — ABNORMAL LOW (ref 25.1–34.0)
MCHC: 29.8 g/dL — ABNORMAL LOW (ref 31.5–36.0)
MCV: 79.3 fL — ABNORMAL LOW (ref 79.5–101.0)
MONO#: 0.8 10*3/uL (ref 0.1–0.9)
MONO%: 11.1 % (ref 0.0–14.0)
NEUT#: 5.2 10*3/uL (ref 1.5–6.5)
NEUT%: 76.5 % (ref 38.4–76.8)
NRBC: 0 % (ref 0–0)
Platelets: 206 10*3/uL (ref 145–400)
RBC: 2.37 10*6/uL — ABNORMAL LOW (ref 3.70–5.45)
RDW: 17.4 % — ABNORMAL HIGH (ref 11.2–14.5)
WBC: 6.8 10*3/uL (ref 3.9–10.3)

## 2014-04-26 LAB — IRON AND TIBC CHCC
%SAT: 7 % — ABNORMAL LOW (ref 21–57)
Iron: 21 ug/dL — ABNORMAL LOW (ref 41–142)
TIBC: 296 ug/dL (ref 236–444)
UIBC: 275 ug/dL (ref 120–384)

## 2014-04-26 LAB — PREPARE RBC (CROSSMATCH)

## 2014-04-26 LAB — HOLD TUBE, BLOOD BANK

## 2014-04-26 LAB — FERRITIN CHCC: Ferritin: 10 ng/ml (ref 9–269)

## 2014-04-26 MED ORDER — FUROSEMIDE 10 MG/ML IJ SOLN: 20.0000 mg | Freq: Once | INTRAMUSCULAR | Status: DC

## 2014-04-26 MED ORDER — SODIUM CHLORIDE 0.9 % IV SOLN
250.0000 mL | Freq: Once | INTRAVENOUS | Status: AC
  Administered 2014-04-26: 250 mL via INTRAVENOUS

## 2014-04-26 NOTE — Patient Instructions (Signed)

## 2014-04-26 NOTE — Progress Notes (Signed)
1600-Order received to infuse 2nd unit of PRBC's at 200 ml/hr per Dr. [name].  Order also received to give pt Lasix 20 mg IV at the end of 2nd unit if pt not hypotensive per Dr. [name].

## 2014-04-27 LAB — TYPE AND SCREEN
ABO/RH(D): A POS
ANTIBODY SCREEN: NEGATIVE
UNIT DIVISION: 0
Unit division: 0

## 2014-05-02 ENCOUNTER — Other Ambulatory Visit: Payer: Self-pay | Admitting: Oncology

## 2014-05-02 ENCOUNTER — Telehealth: Payer: Self-pay | Admitting: *Deleted

## 2014-05-02 ENCOUNTER — Other Ambulatory Visit (HOSPITAL_BASED_OUTPATIENT_CLINIC_OR_DEPARTMENT_OTHER): Payer: Medicare Other

## 2014-05-02 DIAGNOSIS — I780 Hereditary hemorrhagic telangiectasia: Secondary | ICD-10-CM

## 2014-05-02 DIAGNOSIS — K922 Gastrointestinal hemorrhage, unspecified: Secondary | ICD-10-CM

## 2014-05-02 DIAGNOSIS — K31811 Angiodysplasia of stomach and duodenum with bleeding: Secondary | ICD-10-CM

## 2014-05-02 DIAGNOSIS — K31819 Angiodysplasia of stomach and duodenum without bleeding: Secondary | ICD-10-CM

## 2014-05-02 DIAGNOSIS — D509 Iron deficiency anemia, unspecified: Secondary | ICD-10-CM

## 2014-05-02 DIAGNOSIS — D500 Iron deficiency anemia secondary to blood loss (chronic): Secondary | ICD-10-CM

## 2014-05-02 LAB — CBC WITH DIFFERENTIAL/PLATELET
BASO%: 1.2 % (ref 0.0–2.0)
BASOS ABS: 0 10*3/uL (ref 0.0–0.1)
EOS ABS: 0.3 10*3/uL (ref 0.0–0.5)
EOS%: 6.7 % (ref 0.0–7.0)
HEMATOCRIT: 24.5 % — AB (ref 34.8–46.6)
HGB: 7.6 g/dL — ABNORMAL LOW (ref 11.6–15.9)
LYMPH%: 11.6 % — AB (ref 14.0–49.7)
MCH: 24.6 pg — ABNORMAL LOW (ref 25.1–34.0)
MCHC: 31 g/dL — ABNORMAL LOW (ref 31.5–36.0)
MCV: 79.5 fL (ref 79.5–101.0)
MONO#: 0.4 10*3/uL (ref 0.1–0.9)
MONO%: 10.3 % (ref 0.0–14.0)
NEUT%: 70.2 % (ref 38.4–76.8)
NEUTROS ABS: 2.8 10*3/uL (ref 1.5–6.5)
Platelets: 172 10*3/uL (ref 145–400)
RBC: 3.08 10*6/uL — AB (ref 3.70–5.45)
RDW: 20.7 % — ABNORMAL HIGH (ref 11.2–14.5)
WBC: 4 10*3/uL (ref 3.9–10.3)
lymph#: 0.5 10*3/uL — ABNORMAL LOW (ref 0.9–3.3)

## 2014-05-02 LAB — IRON AND TIBC CHCC
%SAT: 4 % — ABNORMAL LOW (ref 21–57)
Iron: 12 ug/dL — ABNORMAL LOW (ref 41–142)
TIBC: 296 ug/dL (ref 236–444)
UIBC: 284 ug/dL (ref 120–384)

## 2014-05-02 LAB — FERRITIN CHCC: FERRITIN: 8 ng/mL — AB (ref 9–269)

## 2014-05-02 LAB — HOLD TUBE, BLOOD BANK

## 2014-05-02 NOTE — Telephone Encounter (Signed)
Call from [place] , Leol[place]ates pt is scheduled for blood tranfusion tomorrow; need order. I called Dr [name]; left VM.

## 2014-05-02 NOTE — Telephone Encounter (Signed)
Per charge RN and MD I have scheduled appt for tomorrow and called the patient

## 2014-05-02 NOTE — Telephone Encounter (Signed)
Been there - done that!

## 2014-05-03 ENCOUNTER — Ambulatory Visit (HOSPITAL_BASED_OUTPATIENT_CLINIC_OR_DEPARTMENT_OTHER): Payer: Medicare Other

## 2014-05-03 ENCOUNTER — Other Ambulatory Visit: Payer: Self-pay | Admitting: *Deleted

## 2014-05-03 VITALS — BP 145/48 | HR 54 | Temp 97.8°F | Resp 18

## 2014-05-03 DIAGNOSIS — K31811 Angiodysplasia of stomach and duodenum with bleeding: Secondary | ICD-10-CM

## 2014-05-03 DIAGNOSIS — D500 Iron deficiency anemia secondary to blood loss (chronic): Secondary | ICD-10-CM | POA: Diagnosis not present

## 2014-05-03 DIAGNOSIS — I780 Hereditary hemorrhagic telangiectasia: Secondary | ICD-10-CM

## 2014-05-03 DIAGNOSIS — D539 Nutritional anemia, unspecified: Secondary | ICD-10-CM

## 2014-05-03 LAB — PREPARE RBC (CROSSMATCH)

## 2014-05-03 MED ORDER — SODIUM CHLORIDE 0.9 % IV SOLN
250.0000 mL | Freq: Once | INTRAVENOUS | Status: AC
  Administered 2014-05-03: 250 mL via INTRAVENOUS

## 2014-05-03 NOTE — Patient Instructions (Signed)

## 2014-05-04 LAB — TYPE AND SCREEN
ABO/RH(D): A POS
Antibody Screen: NEGATIVE
UNIT DIVISION: 0
Unit division: 0

## 2014-05-30 ENCOUNTER — Ambulatory Visit (HOSPITAL_COMMUNITY)
Admission: RE | Admit: 2014-05-30 | Discharge: 2014-05-30 | Disposition: A | Discharge status: Home / Self Care | Payer: Medicare Other | Source: Ambulatory Visit | Attending: Oncology | Admitting: Oncology

## 2014-05-30 ENCOUNTER — Ambulatory Visit (HOSPITAL_BASED_OUTPATIENT_CLINIC_OR_DEPARTMENT_OTHER): Payer: Medicare Other

## 2014-05-30 ENCOUNTER — Other Ambulatory Visit (HOSPITAL_BASED_OUTPATIENT_CLINIC_OR_DEPARTMENT_OTHER): Payer: Medicare Other

## 2014-05-30 ENCOUNTER — Other Ambulatory Visit: Payer: Self-pay | Admitting: Medical Oncology

## 2014-05-30 VITALS — BP 146/73 | HR 62 | Temp 98.7°F | Resp 18

## 2014-05-30 DIAGNOSIS — E118 Type 2 diabetes mellitus with unspecified complications: Secondary | ICD-10-CM

## 2014-05-30 DIAGNOSIS — D6489 Other specified anemias: Secondary | ICD-10-CM

## 2014-05-30 DIAGNOSIS — K31811 Angiodysplasia of stomach and duodenum with bleeding: Secondary | ICD-10-CM

## 2014-05-30 DIAGNOSIS — I780 Hereditary hemorrhagic telangiectasia: Secondary | ICD-10-CM

## 2014-05-30 DIAGNOSIS — E785 Hyperlipidemia, unspecified: Secondary | ICD-10-CM | POA: Diagnosis not present

## 2014-05-30 DIAGNOSIS — D500 Iron deficiency anemia secondary to blood loss (chronic): Secondary | ICD-10-CM | POA: Diagnosis not present

## 2014-05-30 LAB — CBC WITH DIFFERENTIAL/PLATELET
BASO%: 1.3 % (ref 0.0–2.0)
Basophils Absolute: 0.1 10*3/uL (ref 0.0–0.1)
EOS ABS: 0.4 10*3/uL (ref 0.0–0.5)
EOS%: 7.2 % — ABNORMAL HIGH (ref 0.0–7.0)
HCT: 23.3 % — ABNORMAL LOW (ref 34.8–46.6)
HGB: 7.2 g/dL — ABNORMAL LOW (ref 11.6–15.9)
LYMPH%: 11.8 % — AB (ref 14.0–49.7)
MCH: 23.1 pg — ABNORMAL LOW (ref 25.1–34.0)
MCHC: 30.8 g/dL — AB (ref 31.5–36.0)
MCV: 75.1 fL — AB (ref 79.5–101.0)
MONO#: 0.4 10*3/uL (ref 0.1–0.9)
MONO%: 7.3 % (ref 0.0–14.0)
NEUT%: 72.4 % (ref 38.4–76.8)
NEUTROS ABS: 4 10*3/uL (ref 1.5–6.5)
PLATELETS: 217 10*3/uL (ref 145–400)
RBC: 3.1 10*6/uL — ABNORMAL LOW (ref 3.70–5.45)
RDW: 19.3 % — AB (ref 11.2–14.5)
WBC: 5.5 10*3/uL (ref 3.9–10.3)
lymph#: 0.7 10*3/uL — ABNORMAL LOW (ref 0.9–3.3)

## 2014-05-30 LAB — FERRITIN CHCC: Ferritin: 8 ng/ml — ABNORMAL LOW (ref 9–269)

## 2014-05-30 LAB — HOLD TUBE, BLOOD BANK

## 2014-05-30 LAB — IRON AND TIBC CHCC
%SAT: 6 % — ABNORMAL LOW (ref 21–57)
Iron: 20 ug/dL — ABNORMAL LOW (ref 41–142)
TIBC: 330 ug/dL (ref 236–444)
UIBC: 310 ug/dL (ref 120–384)

## 2014-05-30 LAB — PREPARE RBC (CROSSMATCH)

## 2014-05-30 MED ORDER — SODIUM CHLORIDE 0.9 % IV SOLN: 250.0000 mL | Freq: Once | INTRAVENOUS | Status: DC

## 2014-05-30 MED ORDER — SODIUM CHLORIDE 0.9 % IV SOLN
250.0000 mL | Freq: Once | INTRAVENOUS | Status: AC
  Administered 2014-05-30: 250 mL via INTRAVENOUS

## 2014-05-30 NOTE — Patient Instructions (Signed)

## 2014-05-31 LAB — TYPE AND SCREEN
ABO/RH(D): A POS
Antibody Screen: NEGATIVE
UNIT DIVISION: 0
Unit division: 0

## 2014-06-01 ENCOUNTER — Encounter: Payer: Self-pay | Admitting: *Deleted

## 2014-06-01 LAB — PREPARE RBC (CROSSMATCH)

## 2014-06-14 ENCOUNTER — Telehealth: Payer: Self-pay | Admitting: Oncology

## 2014-06-14 NOTE — Telephone Encounter (Signed)
Call to patient to confirm appointment for 06/18/14 at 10:15 lmtcb

## 2014-06-18 ENCOUNTER — Ambulatory Visit (HOSPITAL_COMMUNITY)
Admission: RE | Admit: 2014-06-18 | Discharge: 2014-06-18 | Disposition: A | Discharge status: Home / Self Care | Payer: Medicare Other | Source: Ambulatory Visit | Attending: Oncology | Admitting: Oncology

## 2014-06-18 ENCOUNTER — Ambulatory Visit (HOSPITAL_BASED_OUTPATIENT_CLINIC_OR_DEPARTMENT_OTHER): Payer: Medicare Other

## 2014-06-18 ENCOUNTER — Telehealth: Payer: Self-pay | Admitting: Oncology

## 2014-06-18 ENCOUNTER — Other Ambulatory Visit: Payer: Self-pay | Admitting: Oncology

## 2014-06-18 ENCOUNTER — Encounter: Payer: Self-pay | Admitting: Oncology

## 2014-06-18 ENCOUNTER — Ambulatory Visit (INDEPENDENT_AMBULATORY_CARE_PROVIDER_SITE_OTHER): Payer: Medicare Other | Admitting: Oncology

## 2014-06-18 ENCOUNTER — Other Ambulatory Visit: Payer: Self-pay | Admitting: Medical Oncology

## 2014-06-18 VITALS — BP 141/48 | HR 63 | Temp 98.1°F | Resp 18

## 2014-06-18 VITALS — BP 151/41 | HR 75 | Temp 98.3°F | Ht 61.0 in | Wt 154.7 lb

## 2014-06-18 DIAGNOSIS — D509 Iron deficiency anemia, unspecified: Secondary | ICD-10-CM

## 2014-06-18 DIAGNOSIS — D500 Iron deficiency anemia secondary to blood loss (chronic): Secondary | ICD-10-CM | POA: Diagnosis not present

## 2014-06-18 DIAGNOSIS — I780 Hereditary hemorrhagic telangiectasia: Secondary | ICD-10-CM

## 2014-06-18 DIAGNOSIS — Z794 Long term (current) use of insulin: Secondary | ICD-10-CM

## 2014-06-18 DIAGNOSIS — L439 Lichen planus, unspecified: Secondary | ICD-10-CM

## 2014-06-18 DIAGNOSIS — K922 Gastrointestinal hemorrhage, unspecified: Secondary | ICD-10-CM

## 2014-06-18 DIAGNOSIS — R011 Cardiac murmur, unspecified: Secondary | ICD-10-CM

## 2014-06-18 DIAGNOSIS — D6489 Other specified anemias: Secondary | ICD-10-CM

## 2014-06-18 DIAGNOSIS — I350 Nonrheumatic aortic (valve) stenosis: Secondary | ICD-10-CM | POA: Diagnosis not present

## 2014-06-18 DIAGNOSIS — E119 Type 2 diabetes mellitus without complications: Secondary | ICD-10-CM

## 2014-06-18 DIAGNOSIS — K31819 Angiodysplasia of stomach and duodenum without bleeding: Secondary | ICD-10-CM

## 2014-06-18 DIAGNOSIS — K31811 Angiodysplasia of stomach and duodenum with bleeding: Secondary | ICD-10-CM

## 2014-06-18 DIAGNOSIS — I10 Essential (primary) hypertension: Secondary | ICD-10-CM

## 2014-06-18 LAB — CBC WITH DIFFERENTIAL/PLATELET
BASO%: 0.3 % (ref 0.0–2.0)
Basophils Absolute: 0 10*3/uL (ref 0.0–0.1)
EOS%: 2.6 % (ref 0.0–7.0)
Eosinophils Absolute: 0.1 10*3/uL (ref 0.0–0.5)
HCT: 21 % — ABNORMAL LOW (ref 34.8–46.6)
HGB: 6.3 g/dL — CL (ref 11.6–15.9)
LYMPH#: 0.7 10*3/uL — AB (ref 0.9–3.3)
LYMPH%: 12 % — ABNORMAL LOW (ref 14.0–49.7)
MCH: 21.6 pg — AB (ref 25.1–34.0)
MCHC: 30.1 g/dL — AB (ref 31.5–36.0)
MCV: 72 fL — ABNORMAL LOW (ref 79.5–101.0)
MONO#: 0.5 10*3/uL (ref 0.1–0.9)
MONO%: 9.2 % (ref 0.0–14.0)
NEUT#: 4.1 10*3/uL (ref 1.5–6.5)
NEUT%: 75.9 % (ref 38.4–76.8)
Platelets: 175 10*3/uL (ref 145–400)
RBC: 2.91 10*6/uL — AB (ref 3.70–5.45)
RDW: 20.9 % — ABNORMAL HIGH (ref 11.2–14.5)
WBC: 5.4 10*3/uL (ref 3.9–10.3)

## 2014-06-18 LAB — FERRITIN CHCC: Ferritin: 6 ng/ml — ABNORMAL LOW (ref 9–269)

## 2014-06-18 LAB — PREPARE RBC (CROSSMATCH)

## 2014-06-18 LAB — HOLD TUBE, BLOOD BANK

## 2014-06-18 MED ORDER — SODIUM CHLORIDE 0.9 % IV SOLN: 250.0000 mL | Freq: Once | INTRAVENOUS | Status: DC

## 2014-06-18 MED ORDER — SODIUM CHLORIDE 0.9 % IV SOLN
250.0000 mL | Freq: Once | INTRAVENOUS | Status: AC
  Administered 2014-06-18: 250 mL via INTRAVENOUS

## 2014-06-18 NOTE — Progress Notes (Signed)
Patient ID: [name], female   DOB: 1940/12/13, 74 y.o.   MRN: [id number] Hematology and Oncology Follow Up Visit  [name] [id number] April 05, 1940 74 y.o. 06/18/2014 10:59 AM   Principle Diagnosis: Encounter Diagnoses  Name Primary?  . Gastric AVM Yes  . Osler-Weber-Rendu disease   . 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 Iron deficiency anemia   . Iron deficiency anemia due to chronic blood loss   . HYPERTENSION, BENIGN SYSTEMIC   . Chronic blood loss anemia    clinical summary:  74 year old lady with hereditary hemorrhagic telangiectasias with primary manifestation of her disease being chronic GI blood loss from gastric and, likely intestinal, angiodysplasia. She also has intermittent epistaxis. She requires frequent transfusions and parenteral iron supplementation. She does not tolerate oral iron. Transfusion requirements are becoming more frequent.  She required hospital admission in August 2015 for urgent blood transfusion. She was evaluated by gastroenterology and underwent upper endoscopy on August 26 by Dr. Laurence Spates. A number of her gastric AVMs were actively bleeding. Some were cauterized. The largest was clipped. Despite laser fulguration and clipping of gastric AVMs, she continues to lose blood on a continuous basis.    Interim History:   She has required multiple transfusions since her last visit. Most recent transfusion given on April 20. She has not had an iron infusion since January 2016. Most recent ferritin down to 8 as of 05/30/2014. She denies any abdominal pain, melena, hematochezia, or hematuria at this time. She has had some minor epistaxis. She has chronic fatigue. She spent most of Mother's Day sleeping.  Medications: reviewed  Allergies:  Allergies  Allergen Reactions  . Aspirin Nausea And Vomiting    Review of Systems: See history of present illness Remaining ROS negative:   Physical  Exam: Blood pressure 151/41, pulse 75, temperature 98.3 F (36.8 C), temperature source Oral, height 5\' 1"  (1.549 m), weight 154 lb 11.2 oz (70.171 kg), SpO2 100 %. Wt Readings from Last 3 Encounters:  06/18/14 154 lb 11.2 oz (70.171 kg)  12/25/13 153 lb 11.2 oz (69.718 kg)  10/03/13 151 lb 3.8 oz (68.6 kg)     General appearance: well nourished African American woman HENNT: Pharynx no erythema, exudate, mass, or ulcer. No thyromegaly or thyroid nodules Lymph nodes: No cervical, supraclavicular, or axillary lymphadenopathy Breasts: Lungs: Clear to auscultation, resonant to percussion throughout Heart: Regular rhythm, 2/6 systolic murmur heard at the left sternal border and second right intercostal space., no gallop, no rub, no click, no edema Abdomen: Soft, nontender, normal bowel sounds, no mass, no organomegaly Extremities: No edema, no calf tenderness Musculoskeletal: no joint deformities GU:  Vascular: Carotid pulses 2+, faint, left bruit, distal pulses:  Neurologic: Alert, oriented, right pupil slightly irregular from previous cataract surgery. Both are reactive to light., cranial nerves grossly normal, motor strength 5 over 5, reflexes 1+ symmetric, upper body coordination normal, gait normal, Skin: No rash or ecchymosis  Lab Results: CBC W/Diff    Component Value Date/Time   WBC 5.5 05/30/2014 1006   WBC 8.7 10/03/2013 1331   RBC 3.10* 05/30/2014 1006   RBC 2.79* 10/03/2013 1331   RBC 3.19* 02/01/2011 0839   HGB 7.2* 05/30/2014 1006   HGB 8.6* 10/04/2013 0250   HCT 23.3* 05/30/2014 1006   HCT 26.6* 10/04/2013 0250   PLT 217 05/30/2014 1006   PLT 109* 10/03/2013 1331   MCV 75.1* 05/30/2014 1006   MCV 88.2 10/03/2013 1331  MCH 23.1* 05/30/2014 1006   MCH 28.3 10/03/2013 1331   MCHC 30.8* 05/30/2014 1006   MCHC 32.1 10/03/2013 1331   RDW 19.3* 05/30/2014 1006   RDW 22.8* 10/03/2013 1331   LYMPHSABS 0.7* 05/30/2014 1006   LYMPHSABS 0.6* 09/26/2013 0909   MONOABS  0.4 05/30/2014 1006   MONOABS 0.4 09/26/2013 0909   EOSABS 0.4 05/30/2014 1006   EOSABS 0.2 09/26/2013 0909   BASOSABS 0.1 05/30/2014 1006   BASOSABS 0.0 09/26/2013 0909     Chemistry      Component Value Date/Time   NA 144 04/04/2014 1019   NA 146 10/04/2013 0249   K 4.5 04/04/2014 1019   K 3.5* 10/04/2013 0249   CL 110 10/04/2013 0249   CL 111* 12/28/2011 0825   CO2 26 04/04/2014 1019   CO2 25 10/04/2013 0249   BUN 16.9 04/04/2014 1019   BUN 18 10/04/2013 0249   CREATININE 0.9 04/04/2014 1019   CREATININE 0.77 10/04/2013 0249   CREATININE 0.76 06/06/2013 0947      Component Value Date/Time   CALCIUM 9.3 04/04/2014 1019   CALCIUM 8.8 10/04/2013 0249   ALKPHOS 140 04/04/2014 1019   ALKPHOS 95 10/04/2013 0249   AST 30 04/04/2014 1019   AST 34 10/04/2013 0249   ALT 25 04/04/2014 1019   ALT 34 10/04/2013 0249   BILITOT 0.43 04/04/2014 1019   BILITOT 0.5 10/04/2013 0249       Radiological Studies: No results found.  Impression:  #1. HHT  #2. Chronic GI blood loss secondary to #1 Due to her increased transfusion requirements I'm going to change frequency of her lab monitoring to every 2 weeks to try to stay ahead of the game. I'm going to give her dose of iron dextran so that I can give 1000 mg at 1 session so she doesn't have to run back to the hospital more frequently than she does already. We now have limitations on the dose of the Feraheme that can be given in 1 sitting.  #3. Extensive lichen planus rash lower extremities #4. Aortic stenosis #5. Essential hypertension. #6. Type 2 diabetes now insulin-dependent #7. Faint carotid bruit left side.  CC: Patient Care Team: Nolene Ebbs, MD as PCP - General (Internal Medicine)   [name], MD 5/9/201610:59 AM

## 2014-06-18 NOTE — Telephone Encounter (Signed)
Patient was sent over from [facility] by [facility] for lab. Also per 5/9 pof from [facility] patient to have lab q2w starting today (see standing order) and iron dextran 1st available. Gave patient scheduled for May thru September for q2w labs and iron infusion 5/12.

## 2014-06-18 NOTE — Patient Instructions (Signed)
Go to cancer center today to check labs They will let you know when we can give you an iron infusion Change frequency of lab checks to every 2 weeks starting today, May, 9 Return visit with Dr Darnell Level in 4 months

## 2014-06-19 ENCOUNTER — Ambulatory Visit (HOSPITAL_BASED_OUTPATIENT_CLINIC_OR_DEPARTMENT_OTHER): Payer: Medicare Other

## 2014-06-19 ENCOUNTER — Other Ambulatory Visit: Payer: Self-pay | Admitting: Medical Oncology

## 2014-06-19 VITALS — BP 158/54 | HR 58 | Temp 98.1°F | Resp 18

## 2014-06-19 DIAGNOSIS — D500 Iron deficiency anemia secondary to blood loss (chronic): Secondary | ICD-10-CM

## 2014-06-19 DIAGNOSIS — K31811 Angiodysplasia of stomach and duodenum with bleeding: Secondary | ICD-10-CM | POA: Diagnosis not present

## 2014-06-19 DIAGNOSIS — K922 Gastrointestinal hemorrhage, unspecified: Secondary | ICD-10-CM

## 2014-06-19 DIAGNOSIS — D6489 Other specified anemias: Secondary | ICD-10-CM

## 2014-06-19 LAB — PREPARE RBC (CROSSMATCH)

## 2014-06-19 MED ORDER — SODIUM CHLORIDE 0.9 % IV SOLN
250.0000 mL | Freq: Once | INTRAVENOUS | Status: AC
  Administered 2014-06-19: 250 mL via INTRAVENOUS

## 2014-06-19 NOTE — Patient Instructions (Signed)

## 2014-06-20 LAB — TYPE AND SCREEN
ABO/RH(D): A POS
ANTIBODY SCREEN: NEGATIVE
UNIT DIVISION: 0
Unit division: 0

## 2014-06-21 ENCOUNTER — Ambulatory Visit (HOSPITAL_BASED_OUTPATIENT_CLINIC_OR_DEPARTMENT_OTHER): Payer: Medicare Other

## 2014-06-21 VITALS — BP 167/70 | HR 54 | Temp 98.2°F | Resp 20

## 2014-06-21 DIAGNOSIS — D509 Iron deficiency anemia, unspecified: Secondary | ICD-10-CM

## 2014-06-21 DIAGNOSIS — I780 Hereditary hemorrhagic telangiectasia: Secondary | ICD-10-CM | POA: Diagnosis not present

## 2014-06-21 DIAGNOSIS — D500 Iron deficiency anemia secondary to blood loss (chronic): Secondary | ICD-10-CM

## 2014-06-21 DIAGNOSIS — K31819 Angiodysplasia of stomach and duodenum without bleeding: Secondary | ICD-10-CM

## 2014-06-21 DIAGNOSIS — K922 Gastrointestinal hemorrhage, unspecified: Secondary | ICD-10-CM | POA: Diagnosis not present

## 2014-06-21 MED ORDER — SODIUM CHLORIDE 0.9 % IV SOLN
1000.0000 mg | Freq: Once | INTRAVENOUS | Status: AC
  Administered 2014-06-21: 1000 mg via INTRAVENOUS
  Filled 2014-06-21: qty 20

## 2014-06-21 MED ORDER — SODIUM CHLORIDE 0.9 % IV SOLN
25.0000 mg | Freq: Once | INTRAVENOUS | Status: AC
  Administered 2014-06-21: 25 mg via INTRAVENOUS
  Filled 2014-06-21: qty 0.5

## 2014-06-21 NOTE — Patient Instructions (Signed)

## 2014-07-02 ENCOUNTER — Other Ambulatory Visit: Payer: Medicare Other

## 2014-07-04 ENCOUNTER — Other Ambulatory Visit (HOSPITAL_BASED_OUTPATIENT_CLINIC_OR_DEPARTMENT_OTHER): Payer: Medicare Other

## 2014-07-04 ENCOUNTER — Other Ambulatory Visit: Payer: Self-pay | Admitting: Oncology

## 2014-07-04 DIAGNOSIS — I780 Hereditary hemorrhagic telangiectasia: Secondary | ICD-10-CM

## 2014-07-04 DIAGNOSIS — D500 Iron deficiency anemia secondary to blood loss (chronic): Secondary | ICD-10-CM

## 2014-07-04 DIAGNOSIS — K31819 Angiodysplasia of stomach and duodenum without bleeding: Secondary | ICD-10-CM

## 2014-07-04 LAB — HOLD TUBE, BLOOD BANK

## 2014-07-04 LAB — CBC WITH DIFFERENTIAL/PLATELET
BASO%: 0.7 % (ref 0.0–2.0)
Basophils Absolute: 0 10*3/uL (ref 0.0–0.1)
EOS%: 6 % (ref 0.0–7.0)
Eosinophils Absolute: 0.3 10*3/uL (ref 0.0–0.5)
HCT: 29.3 % — ABNORMAL LOW (ref 34.8–46.6)
HGB: 9.5 g/dL — ABNORMAL LOW (ref 11.6–15.9)
LYMPH#: 0.9 10*3/uL (ref 0.9–3.3)
LYMPH%: 15.5 % (ref 14.0–49.7)
MCH: 27.1 pg (ref 25.1–34.0)
MCHC: 32.4 g/dL (ref 31.5–36.0)
MCV: 83.7 fL (ref 79.5–101.0)
MONO#: 0.5 10*3/uL (ref 0.1–0.9)
MONO%: 8.1 % (ref 0.0–14.0)
NEUT#: 3.9 10*3/uL (ref 1.5–6.5)
NEUT%: 69.7 % (ref 38.4–76.8)
NRBC: 0 % (ref 0–0)
Platelets: 184 10*3/uL (ref 145–400)
RBC: 3.5 10*6/uL — AB (ref 3.70–5.45)
RDW: 24.7 % — ABNORMAL HIGH (ref 11.2–14.5)
WBC: 5.7 10*3/uL (ref 3.9–10.3)

## 2014-07-05 ENCOUNTER — Encounter (HOSPITAL_COMMUNITY): Payer: Self-pay | Admitting: Emergency Medicine

## 2014-07-05 ENCOUNTER — Inpatient Hospital Stay (HOSPITAL_COMMUNITY)
Admission: EM | Admit: 2014-07-05 | Discharge: 2014-07-07 | DRG: 300 | Disposition: A | Discharge status: Home / Self Care | Payer: Medicare Other | Attending: Internal Medicine | Admitting: Internal Medicine

## 2014-07-05 DIAGNOSIS — K449 Diaphragmatic hernia without obstruction or gangrene: Secondary | ICD-10-CM | POA: Diagnosis not present

## 2014-07-05 DIAGNOSIS — I5032 Chronic diastolic (congestive) heart failure: Secondary | ICD-10-CM | POA: Diagnosis not present

## 2014-07-05 DIAGNOSIS — D509 Iron deficiency anemia, unspecified: Secondary | ICD-10-CM | POA: Diagnosis not present

## 2014-07-05 DIAGNOSIS — E1165 Type 2 diabetes mellitus with hyperglycemia: Secondary | ICD-10-CM | POA: Diagnosis not present

## 2014-07-05 DIAGNOSIS — D62 Acute posthemorrhagic anemia: Secondary | ICD-10-CM | POA: Diagnosis not present

## 2014-07-05 DIAGNOSIS — I10 Essential (primary) hypertension: Secondary | ICD-10-CM | POA: Diagnosis present

## 2014-07-05 DIAGNOSIS — K920 Hematemesis: Secondary | ICD-10-CM | POA: Diagnosis present

## 2014-07-05 DIAGNOSIS — Z886 Allergy status to analgesic agent status: Secondary | ICD-10-CM | POA: Diagnosis not present

## 2014-07-05 DIAGNOSIS — E860 Dehydration: Secondary | ICD-10-CM | POA: Diagnosis not present

## 2014-07-05 DIAGNOSIS — K219 Gastro-esophageal reflux disease without esophagitis: Secondary | ICD-10-CM | POA: Diagnosis not present

## 2014-07-05 DIAGNOSIS — D72829 Elevated white blood cell count, unspecified: Secondary | ICD-10-CM | POA: Diagnosis present

## 2014-07-05 DIAGNOSIS — K922 Gastrointestinal hemorrhage, unspecified: Secondary | ICD-10-CM | POA: Diagnosis not present

## 2014-07-05 DIAGNOSIS — R011 Cardiac murmur, unspecified: Secondary | ICD-10-CM | POA: Diagnosis not present

## 2014-07-05 DIAGNOSIS — K921 Melena: Secondary | ICD-10-CM | POA: Diagnosis not present

## 2014-07-05 DIAGNOSIS — E785 Hyperlipidemia, unspecified: Secondary | ICD-10-CM | POA: Diagnosis not present

## 2014-07-05 DIAGNOSIS — Z794 Long term (current) use of insulin: Secondary | ICD-10-CM

## 2014-07-05 DIAGNOSIS — Q2733 Arteriovenous malformation of digestive system vessel: Secondary | ICD-10-CM | POA: Diagnosis not present

## 2014-07-05 DIAGNOSIS — N179 Acute kidney failure, unspecified: Secondary | ICD-10-CM | POA: Diagnosis present

## 2014-07-05 DIAGNOSIS — D500 Iron deficiency anemia secondary to blood loss (chronic): Secondary | ICD-10-CM | POA: Diagnosis not present

## 2014-07-05 DIAGNOSIS — D649 Anemia, unspecified: Secondary | ICD-10-CM

## 2014-07-05 DIAGNOSIS — E119 Type 2 diabetes mellitus without complications: Secondary | ICD-10-CM

## 2014-07-05 LAB — CBG MONITORING, ED
GLUCOSE-CAPILLARY: 382 mg/dL — AB (ref 65–99)
Glucose-Capillary: 382 mg/dL — ABNORMAL HIGH (ref 65–99)

## 2014-07-05 LAB — COMPREHENSIVE METABOLIC PANEL
ALK PHOS: 68 U/L (ref 38–126)
ALT: 19 U/L (ref 14–54)
ANION GAP: 14 (ref 5–15)
AST: 24 U/L (ref 15–41)
Albumin: 3.2 g/dL — ABNORMAL LOW (ref 3.5–5.0)
BUN: 32 mg/dL — ABNORMAL HIGH (ref 6–20)
CALCIUM: 8.7 mg/dL — AB (ref 8.9–10.3)
CO2: 22 mmol/L (ref 22–32)
CREATININE: 1.3 mg/dL — AB (ref 0.44–1.00)
Chloride: 104 mmol/L (ref 101–111)
GFR calc Af Amer: 46 mL/min — ABNORMAL LOW (ref >60)
GFR calc non Af Amer: 39 mL/min — ABNORMAL LOW (ref >60)
Glucose, Bld: 477 mg/dL — ABNORMAL HIGH (ref 65–99)
Potassium: 3.9 mmol/L (ref 3.5–5.1)
Sodium: 140 mmol/L (ref 135–145)
TOTAL PROTEIN: 5.2 g/dL — AB (ref 6.5–8.1)
Total Bilirubin: 0.8 mg/dL (ref 0.3–1.2)

## 2014-07-05 LAB — URINALYSIS, ROUTINE W REFLEX MICROSCOPIC
Bilirubin Urine: NEGATIVE
Glucose, UA: >1000 mg/dL — AB
Ketones, ur: NEGATIVE mg/dL
Leukocytes, UA: NEGATIVE
Nitrite: NEGATIVE
Protein, ur: NEGATIVE mg/dL
SPECIFIC GRAVITY, URINE: 1.024 (ref 1.005–1.030)
Urobilinogen, UA: 0.2 mg/dL (ref 0.0–1.0)
pH: 5.5 (ref 5.0–8.0)

## 2014-07-05 LAB — GLUCOSE, CAPILLARY
GLUCOSE-CAPILLARY: 316 mg/dL — AB (ref 65–99)
Glucose-Capillary: 370 mg/dL — ABNORMAL HIGH (ref 65–99)

## 2014-07-05 LAB — CBC
HCT: 22.5 % — ABNORMAL LOW (ref 36.0–46.0)
Hemoglobin: 7.1 g/dL — ABNORMAL LOW (ref 12.0–15.0)
MCH: 27.3 pg (ref 26.0–34.0)
MCHC: 31.6 g/dL (ref 30.0–36.0)
MCV: 86.5 fL (ref 78.0–100.0)
Platelets: 185 10*3/uL (ref 150–400)
RBC: 2.6 MIL/uL — AB (ref 3.87–5.11)
RDW: 25.1 % — ABNORMAL HIGH (ref 11.5–15.5)
WBC: 12.4 10*3/uL — ABNORMAL HIGH (ref 4.0–10.5)

## 2014-07-05 LAB — PROTIME-INR
INR: 1.22 (ref 0.00–1.49)
Prothrombin Time: 15.6 seconds — ABNORMAL HIGH (ref 11.6–15.2)

## 2014-07-05 LAB — MRSA PCR SCREENING: MRSA by PCR: NEGATIVE

## 2014-07-05 LAB — URINE MICROSCOPIC-ADD ON

## 2014-07-05 LAB — PREPARE RBC (CROSSMATCH)

## 2014-07-05 MED ORDER — PANTOPRAZOLE SODIUM 40 MG IV SOLR: 40.0000 mg | Freq: Two times a day (BID) | INTRAVENOUS | Status: DC

## 2014-07-05 MED ORDER — SODIUM CHLORIDE 0.9 % IV SOLN
8.0000 mg/h | INTRAVENOUS | Status: DC
  Administered 2014-07-06 (×2): 8 mg/h via INTRAVENOUS
  Filled 2014-07-05 (×5): qty 80

## 2014-07-05 MED ORDER — SODIUM CHLORIDE 0.9 % IV BOLUS (SEPSIS)
1000.0000 mL | Freq: Once | INTRAVENOUS | Status: AC
  Administered 2014-07-05: 1000 mL via INTRAVENOUS

## 2014-07-05 MED ORDER — INSULIN GLARGINE 100 UNIT/ML ~~LOC~~ SOLN
15.0000 [IU] | Freq: Every day | SUBCUTANEOUS | Status: DC
  Administered 2014-07-05 – 2014-07-06 (×2): 15 [IU] via SUBCUTANEOUS
  Filled 2014-07-05 (×4): qty 0.15

## 2014-07-05 MED ORDER — SODIUM CHLORIDE 0.9 % IV SOLN
Freq: Once | INTRAVENOUS | Status: AC
  Administered 2014-07-05: 18:00:00 via INTRAVENOUS

## 2014-07-05 MED ORDER — INSULIN ASPART 100 UNIT/ML ~~LOC~~ SOLN
0.0000 [IU] | Freq: Three times a day (TID) | SUBCUTANEOUS | Status: DC
  Administered 2014-07-06 – 2014-07-07 (×3): 3 [IU] via SUBCUTANEOUS
  Administered 2014-07-07: 11 [IU] via SUBCUTANEOUS

## 2014-07-05 MED ORDER — PANTOPRAZOLE SODIUM 40 MG IV SOLR
80.0000 mg | Freq: Once | INTRAVENOUS | Status: AC
  Administered 2014-07-05: 80 mg via INTRAVENOUS
  Filled 2014-07-05: qty 80

## 2014-07-05 NOTE — ED Notes (Signed)
IV attempt X1. Rosser, M will attempt to try.

## 2014-07-05 NOTE — ED Notes (Signed)
Hospitalist at bedside 

## 2014-07-05 NOTE — ED Notes (Signed)
EDP at bedside  

## 2014-07-05 NOTE — ED Notes (Signed)
Attempt 2nd IV unsuccessful (request for 2nd IV site per [facility]); floor nurse aware of unsuccessful attempt.

## 2014-07-05 NOTE — H&P (Signed)
History and Physical  [name] [id number] DOB: 1940-04-20 DOA: 07/05/2014  Referring physician: EDP PCP: Philis Fendt, MD   Chief Complaint: vomiting blood  HPI: [name] is a 74 y.o. female   Is a very pleasant female with know history of osler-weber-rendu disease, Gastric AVM, chronic iron deficiency anemia requiring intermittent iron transfusion and blood transfusion, presented to ED due to sudden onset of vomiting blood x multiple times started from 2am, patient is not able to quantitate the amount, but reported seeing blood clot in vomits. She also reported hematochezia started this am. She denies pain, no dizziness, no chest pain, no sob, no fever. ED course: she is hemodynamically stable on arrival to ER lab work showed mild leukocytosis wbc 12.4, hgb 7 (9.5 yesterday), rectal exam in the ED with hematochezia, two units of prbc ordered, Gi consulted by EDP, hospitalist called for admission.  Review of Systems:  Detail per HPI, Review of systems are otherwise negative  Past Medical History  Diagnosis Date  . Anemia   . S/P endoscopy     with laser treatment  08/19/2006  . History of epistaxis   . Hypertension   . Hyperlipidemia   . Telangiectasia     Gastric   . Lichen planus     Both lower extremities  . GERD (gastroesophageal reflux disease)   . Heart murmur   . HTN (hypertension), benign 03/02/2012  . Family history of anesthesia complication     "niece has a hard time coming out" (09/15/2012)  . CHF (congestive heart failure)   . Pneumonia 1990's    "twice" (09/15/2012)  . Type II diabetes mellitus     insulin requiring.  Marland Kitchen History of blood transfusion     "getting 2 today; have had transfusions before" (09/15/2012)   Past Surgical History  Procedure Laterality Date  . Nose surgery      "for bleeding" (09/15/2012)  . Savory dilation  02/26/2011    Procedure: SAVORY DILATION;  Surgeon: [name], MD;  Location: [name];  Service: Endoscopy;   Laterality: N/A;  c-arm needed  . Esophagogastroduodenoscopy  02/26/2011    Procedure: ESOPHAGOGASTRODUODENOSCOPY (EGD);  Surgeon: [name], MD;  Location: [name];  Service: Endoscopy;  Laterality: N/A;  . Esophagogastroduodenoscopy N/A 11/08/2012    Procedure: ESOPHAGOGASTRODUODENOSCOPY (EGD);  Surgeon: [name], MD;  Location: [name];  Service: Endoscopy;  Laterality: N/A;  . Cataract surgery    . Esophagogastroduodenoscopy N/A 10/04/2013    Procedure: ESOPHAGOGASTRODUODENOSCOPY (EGD);  Surgeon: Winfield Cunas., MD;  Location: [name];  Service: Endoscopy;  Laterality: N/A;  with APC on stand-by   Social History:  reports that she quit smoking about 43 years ago. Her smoking use included Cigarettes. She quit after 2 years of use. She has never used smokeless tobacco. She reports that she does not drink alcohol or use illicit drugs. Patient lives at home with her husband & is able to participate in activities of daily living independently.  Allergies  Allergen Reactions  . Aspirin Nausea And Vomiting    Family History  Problem Relation Age of Onset  . Breast cancer    . Malignant hyperthermia Neg Hx   . Stroke Father       Prior to Admission medications   Medication Sig Start Date End Date Taking? Authorizing Provider  amitriptyline (ELAVIL) 25 MG tablet Take 1 tablet by mouth at bedtime. 07/04/14  Yes Historical Provider, MD  enalapril (VASOTEC) 20 MG  tablet Take 20 mg by mouth daily.   Yes Historical Provider, MD  insulin detemir (LEVEMIR) 100 UNIT/ML injection Inject 0.3 mLs (30 Units total) into the skin at bedtime. Patient taking differently: Inject 30-40 Units into the skin 2 (two) times daily. 40 units in the Morning, and 30 units at Bedtime 10/05/13  Yes Robbie Lis, MD  oxymetazoline (AFRIN) 0.05 % nasal spray Place 1 spray into both nostrils 2 (two) times daily as needed (Epistaxis). 10/05/13  Yes Robbie Lis, MD  pantoprazole (PROTONIX) 40 MG  tablet Take 1 tablet (40 mg total) by mouth daily as needed. For heartburn 10/05/13  Yes Robbie Lis, MD  vitamin B-12 (CYANOCOBALAMIN) 1000 MCG tablet Take 1,000 mcg by mouth at bedtime.    Yes Historical Provider, MD  Vitamin D, Ergocalciferol, (DRISDOL) 50000 UNITS CAPS Take 50,000 Units by mouth every 7 (seven) days. Takes on Monday   Yes Historical Provider, MD    Physical Exam: BP 116/45 mmHg  Pulse 73  Temp(Src) 98.8 F (37.1 C) (Oral)  Resp 18  SpO2 98%  General:  AAox3, anxious, but not in acute distress Eyes: PERRL ENT: unremarkable Neck: supple, no JVD Cardiovascular: RRR, 2/6 systolic murmur Respiratory: CTABL Abdomen: soft/ND/ND, positive bowel sounds Skin: no rash Musculoskeletal:  No edema Psychiatric: calm/cooperative Neurologic: aaox3, no focal deficit          Labs on Admission:  Basic Metabolic Panel:  Recent Labs Lab 07/05/14 1206  NA 140  K 3.9  CL 104  CO2 22  GLUCOSE 477*  BUN 32*  CREATININE 1.30*  CALCIUM 8.7*   Liver Function Tests:  Recent Labs Lab 07/05/14 1206  AST 24  ALT 19  ALKPHOS 68  BILITOT 0.8  PROT 5.2*  ALBUMIN 3.2*   No results for input(s): LIPASE, AMYLASE in the last 168 hours. No results for input(s): AMMONIA in the last 168 hours. CBC:  Recent Labs Lab 07/04/14 0841 07/05/14 1206  WBC 5.7 12.4*  NEUTROABS 3.9  --   HGB 9.5* 7.1*  HCT 29.3* 22.5*  MCV 83.7 86.5  PLT 184 185   Cardiac Enzymes: No results for input(s): CKTOTAL, CKMB, CKMBINDEX, TROPONINI in the last 168 hours.  BNP (last 3 results) No results for input(s): BNP in the last 8760 hours.  ProBNP (last 3 results)  Recent Labs  10/03/13 1400  PROBNP 155.7*    CBG:  Recent Labs Lab 07/05/14 1146 07/05/14 1504  GLUCAP 382* 382*    Radiological Exams on Admission: No results found.  EKG: not obtained, sinus rhythm on tele.  Assessment/Plan Present on Admission:  . Upper GI bleed . GI  bleed   Hematemesis/hematochezia/acute blood loss anemia in a patient with known h/o Gastric AVM. On ppi drip, prbc x2units, ivf, admit to stepdown, monitor serial h/h. Clear liquids for now, npo after midnight, [facility] to scope tomorrow. Patient reported poor IV access, picc line ordered.  Insulin dependent DM2. Check a1c, half nighttime long acting insulin dose, continue ssi.  ARF with elevation if bun, likely from GI bleed and dehydration, ua no infection, continue ivf, renal dosing meds. Hold lisinopril.  H/o HTN: now normal tensive, hold home bp meds in the setting of gi bleed/dehydration, restart when appropriate.  Cardiac murmur, with h/o diastolic CHF, currently dry, will repeat echo. Close monitor volume status.  DVT prophylaxis with SCd's  Consultants: [facility]  Code Status: full  Family Communication:  patient  Disposition Plan: admit to stepdown  Time  spent: >45mins  Reather Steller MD, PhD [facility] Pager [phone] If 7PM-7AM, please contact night-coverage at [email or web address], password [email or web address]

## 2014-07-05 NOTE — ED Provider Notes (Signed)
Spoke with Dr Watt Climes who recommends clears tonight and NPO after midnight.   Jola Schmidt, MD 07/05/14 [phone]

## 2014-07-05 NOTE — ED Notes (Signed)
Pt states she woke up feeling strange around 2:30 this morning. States she vomited a large amount twice, and since then has felt uneasy. States no pain, slightly SOB. CBG on arrival 382, pt is a diabetic, states she hasn't eaten or taken her insulin this morning.

## 2014-07-05 NOTE — Progress Notes (Signed)
Pt refused PICC. IV team aware.

## 2014-07-05 NOTE — Progress Notes (Signed)
Patient's hospital computer chart and our office computer chart was reviewed and her case was discussed with the ER physician and she has a long history of bleeding AVMs with multiple colonoscopies and endoscopies over the years and her last endoscopy was last year and her last 2 colonoscopies were 2007 and 2003 which were normal and I have scheduled her for a endoscopy tomorrow at 9:30 AM and okay to have clear liquids tonight but nothing by mouth after midnight and call us sooner if any question or problem

## 2014-07-05 NOTE — ED Provider Notes (Addendum)
CSN: [id number]     Arrival date & time 07/05/14  1141 History   First MD Initiated Contact with Patient 07/05/14 1347     Chief Complaint  Patient presents with  . Hyperglycemia  . Nausea  . Emesis      HPI Patient presents the emergency department because she developed frank hematemesis this morning at approximately 2:00 AM.  She vomited for about an hour.  She and her husband report that she vomited frank blood.  She has a long-standing history of gastric AVMs.  She's undergone clipping as well as laser surgery by GI before in the past.  She states no more nausea or vomiting since approximately 3 to 3:30 AM.  She denies lightheadedness or weakness.  She notes that her blood sugar was elevated and that she came to the emergency department for evaluation.   Past Medical History  Diagnosis Date  . Anemia   . S/P endoscopy     with laser treatment  08/19/2006  . History of epistaxis   . Hypertension   . Hyperlipidemia   . Telangiectasia     Gastric   . Lichen planus     Both lower extremities  . GERD (gastroesophageal reflux disease)   . Heart murmur   . HTN (hypertension), benign 03/02/2012  . Family history of anesthesia complication     "niece has a hard time coming out" (09/15/2012)  . CHF (congestive heart failure)   . Pneumonia 1990's    "twice" (09/15/2012)  . Type II diabetes mellitus     insulin requiring.  Marland Kitchen History of blood transfusion     "getting 2 today; have had transfusions before" (09/15/2012)   Past Surgical History  Procedure Laterality Date  . Nose surgery      "for bleeding" (09/15/2012)  . Savory dilation  02/26/2011    Procedure: SAVORY DILATION;  Surgeon: [name], MD;  Location: [name];  Service: Endoscopy;  Laterality: N/A;  c-arm needed  . Esophagogastroduodenoscopy  02/26/2011    Procedure: ESOPHAGOGASTRODUODENOSCOPY (EGD);  Surgeon: [name], MD;  Location: [name];  Service: Endoscopy;  Laterality: N/A;  . Esophagogastroduodenoscopy  N/A 11/08/2012    Procedure: ESOPHAGOGASTRODUODENOSCOPY (EGD);  Surgeon: [name], MD;  Location: [name];  Service: Endoscopy;  Laterality: N/A;  . Cataract surgery    . Esophagogastroduodenoscopy N/A 10/04/2013    Procedure: ESOPHAGOGASTRODUODENOSCOPY (EGD);  Surgeon: Winfield Cunas., MD;  Location: [name];  Service: Endoscopy;  Laterality: N/A;  with APC on stand-by   Family History  Problem Relation Age of Onset  . Breast cancer    . Malignant hyperthermia Neg Hx   . Stroke Father    History  Substance Use Topics  . Smoking status: Former Smoker -- 2 years    Types: Cigarettes    Quit date: 02/10/1971  . Smokeless tobacco: Never Used     Comment: 09/15/2012 "smoked 50-60 yr ago"  . Alcohol Use: No   OB History    No data available     Review of Systems  All other systems reviewed and are negative.     Allergies  Aspirin  Home Medications   Prior to Admission medications   Medication Sig Start Date End Date Taking? Authorizing Provider  amitriptyline (ELAVIL) 25 MG tablet Take 1 tablet by mouth at bedtime. 07/04/14  Yes Historical Provider, MD  enalapril (VASOTEC) 20 MG tablet Take 20 mg by mouth daily.   Yes Historical Provider, MD  insulin detemir (LEVEMIR) 100 UNIT/ML injection Inject 0.3 mLs (30 Units total) into the skin at bedtime. Patient taking differently: Inject 30-40 Units into the skin 2 (two) times daily. 40 units in the Morning, and 30 units at Bedtime 10/05/13  Yes Robbie Lis, MD  oxymetazoline (AFRIN) 0.05 % nasal spray Place 1 spray into both nostrils 2 (two) times daily as needed (Epistaxis). 10/05/13  Yes Robbie Lis, MD  pantoprazole (PROTONIX) 40 MG tablet Take 1 tablet (40 mg total) by mouth daily as needed. For heartburn 10/05/13  Yes Robbie Lis, MD  vitamin B-12 (CYANOCOBALAMIN) 1000 MCG tablet Take 1,000 mcg by mouth at bedtime.    Yes Historical Provider, MD  Vitamin D, Ergocalciferol, (DRISDOL) 50000 UNITS CAPS Take  50,000 Units by mouth every 7 (seven) days. Takes on Monday   Yes Historical Provider, MD   BP 138/36 mmHg  Pulse 81  Temp(Src) 98 F (36.7 C) (Oral)  Resp 18  SpO2 98% Physical Exam  Constitutional: She is oriented to person, place, and time. She appears well-developed and well-nourished. No distress.  HENT:  Head: Normocephalic and atraumatic.  Eyes: EOM are normal.  Neck: Normal range of motion.  Cardiovascular: Normal rate, regular rhythm and normal heart sounds.   Pulmonary/Chest: Effort normal and breath sounds normal.  Abdominal: Soft. She exhibits no distension. There is no tenderness.  Genitourinary:  Hematochezia on  rectal exam.  Musculoskeletal: Normal range of motion.  Neurological: She is alert and oriented to person, place, and time.  Skin: Skin is warm and dry.  Psychiatric: She has a normal mood and affect. Judgment normal.  Nursing note and vitals reviewed.   ED Course  Procedures (including critical care time)  CRITICAL CARE Performed by: Hoy Morn Total critical care time: 32 Critical care time was exclusive of separately billable procedures and treating other patients. Critical care was necessary to treat or prevent imminent or life-threatening deterioration. Critical care was time spent personally by me on the following activities: development of treatment plan with patient and/or surrogate as well as nursing, discussions with consultants, evaluation of patient's response to treatment, examination of patient, obtaining history from patient or surrogate, ordering and performing treatments and interventions, ordering and review of laboratory studies, ordering and review of radiographic studies, pulse oximetry and re-evaluation of patient's condition.   Labs Review Labs Reviewed  CBC - Abnormal; Notable for the following:    WBC 12.4 (*)    RBC 2.60 (*)    Hemoglobin 7.1 (*)    HCT 22.5 (*)    RDW 25.1 (*)    All other components within normal  limits  COMPREHENSIVE METABOLIC PANEL - Abnormal; Notable for the following:    Glucose, Bld 477 (*)    BUN 32 (*)    Creatinine, Ser 1.30 (*)    Calcium 8.7 (*)    Total Protein 5.2 (*)    Albumin 3.2 (*)    GFR calc non Af Amer 39 (*)    GFR calc Af Amer 46 (*)    All other components within normal limits  CBG MONITORING, ED - Abnormal; Notable for the following:    Glucose-Capillary 382 (*)    All other components within normal limits  CBG MONITORING, ED - Abnormal; Notable for the following:    Glucose-Capillary 382 (*)    All other components within normal limits  URINALYSIS, ROUTINE W REFLEX MICROSCOPIC (NOT AT [facility])   BUN  Date Value Ref Range Status  07/05/2014 32* 6 - 20 mg/dL Final  04/04/2014 16.9 7.0 - 26.0 mg/dL Final  10/04/2013 18 6 - 23 mg/dL Final  10/03/2013 26* 6 - 23 mg/dL Final  09/08/2013 16 6 - 23 mg/dL Final  12/28/2011 18.0 7.0 - 26.0 mg/dL Final  11/03/2011 16.0 7.0 - 26.0 mg/dL Final   CREATININE  Date Value Ref Range Status  04/04/2014 0.9 0.6 - 1.1 mg/dL Final  12/28/2011 0.7 0.6 - 1.1 mg/dL Final  11/03/2011 0.9 0.6 - 1.1 mg/dL Final   CREAT  Date Value Ref Range Status  06/06/2013 0.76 0.50 - 1.10 mg/dL Final   CREATININE, SER  Date Value Ref Range Status  07/05/2014 1.30* 0.44 - 1.00 mg/dL Final  10/04/2013 0.77 0.50 - 1.10 mg/dL Final  10/03/2013 0.79 0.50 - 1.10 mg/dL Final  09/08/2013 0.70 0.50 - 1.10 mg/dL Final     HEMOGLOBIN  Date Value Ref Range Status  07/05/2014 7.1* 12.0 - 15.0 g/dL Final  10/04/2013 8.6* 12.0 - 15.0 g/dL Final  10/03/2013 7.9* 12.0 - 15.0 g/dL Final  09/26/2013 7.5* 12.0 - 15.0 g/dL Final   HGB  Date Value Ref Range Status  07/04/2014 9.5* 11.6 - 15.9 g/dL Final  06/18/2014 6.3* 11.6 - 15.9 g/dL Final  05/30/2014 7.2* 11.6 - 15.9 g/dL Final  05/02/2014 7.6* 11.6 - 15.9 g/dL Final      Imaging Review No results found.   EKG Interpretation None      MDM   Final diagnoses:  Upper GI  bleed  Anemia, unspecified anemia type  AKI (acute kidney injury)     Anemia.  Likely upper GI bleed.  Will initiate IV Protonix at this time.  No indication for blood transfusion at this time.  Current heart rate is 81 and blood pressure is 0000000 systolic.  I think the patient will benefit from observation overnight in the hospital and serial CBCs.  She's had no vomiting since approximately 3:30 AM.  She had nausea but this is now since resolved as well.  She does have what appears to be hematochezia on rectal examination.  She does like could have some ongoing blood loss.  She'll need to be followed closely.  Will speak with GI. hospitalist requesting blood transfusion now   Jola Schmidt, MD 07/05/14 [place], MD 07/05/14 1600

## 2014-07-06 ENCOUNTER — Encounter (HOSPITAL_COMMUNITY)
Admission: EM | Disposition: A | Discharge status: Home / Self Care | Payer: Self-pay | Source: Home / Self Care | Attending: Internal Medicine

## 2014-07-06 ENCOUNTER — Encounter (HOSPITAL_COMMUNITY): Payer: Self-pay

## 2014-07-06 ENCOUNTER — Inpatient Hospital Stay (HOSPITAL_COMMUNITY): Payer: Medicare Other

## 2014-07-06 DIAGNOSIS — K922 Gastrointestinal hemorrhage, unspecified: Secondary | ICD-10-CM

## 2014-07-06 DIAGNOSIS — R011 Cardiac murmur, unspecified: Secondary | ICD-10-CM

## 2014-07-06 HISTORY — PX: HOT HEMOSTASIS: SHX5433

## 2014-07-06 HISTORY — PX: ESOPHAGOGASTRODUODENOSCOPY: SHX5428

## 2014-07-06 LAB — TYPE AND SCREEN
ABO/RH(D): A POS
Antibody Screen: NEGATIVE
UNIT DIVISION: 0
Unit division: 0

## 2014-07-06 LAB — CBC
HEMATOCRIT: 36.8 % (ref 36.0–46.0)
Hemoglobin: 11.8 g/dL — ABNORMAL LOW (ref 12.0–15.0)
MCH: 29.4 pg (ref 26.0–34.0)
MCHC: 32.1 g/dL (ref 30.0–36.0)
MCV: 91.5 fL (ref 78.0–100.0)
PLATELETS: 364 10*3/uL (ref 150–400)
RBC: 4.02 MIL/uL (ref 3.87–5.11)
RDW: 14.5 % (ref 11.5–15.5)
WBC: 20.6 10*3/uL — ABNORMAL HIGH (ref 4.0–10.5)

## 2014-07-06 LAB — GLUCOSE, CAPILLARY
GLUCOSE-CAPILLARY: 198 mg/dL — AB (ref 65–99)
Glucose-Capillary: 175 mg/dL — ABNORMAL HIGH (ref 65–99)
Glucose-Capillary: 170 mg/dL — ABNORMAL HIGH (ref 65–99)

## 2014-07-06 SURGERY — EGD (ESOPHAGOGASTRODUODENOSCOPY)
Anesthesia: Moderate Sedation

## 2014-07-06 MED ORDER — SODIUM CHLORIDE 0.9 % IV SOLN
INTRAVENOUS | Status: DC | PRN
  Administered 2014-07-06: 02:00:00 via INTRAVENOUS

## 2014-07-06 MED ORDER — FENTANYL CITRATE (PF) 100 MCG/2ML IJ SOLN
INTRAMUSCULAR | Status: DC | PRN
  Administered 2014-07-06: 12.5 ug via INTRAVENOUS
  Administered 2014-07-06: 25 ug via INTRAVENOUS
  Administered 2014-07-06: 12.5 ug via INTRAVENOUS

## 2014-07-06 MED ORDER — MIDAZOLAM HCL 10 MG/2ML IJ SOLN
INTRAMUSCULAR | Status: AC
  Filled 2014-07-06: qty 2

## 2014-07-06 MED ORDER — FENTANYL CITRATE (PF) 100 MCG/2ML IJ SOLN
INTRAMUSCULAR | Status: AC
  Filled 2014-07-06: qty 2

## 2014-07-06 MED ORDER — SODIUM CHLORIDE 0.9 % IV SOLN: INTRAVENOUS | Status: DC

## 2014-07-06 MED ORDER — MIDAZOLAM HCL 10 MG/2ML IJ SOLN
INTRAMUSCULAR | Status: DC | PRN
  Administered 2014-07-06 (×4): 1 mg via INTRAVENOUS

## 2014-07-06 MED ORDER — PANTOPRAZOLE SODIUM 40 MG PO TBEC
40.0000 mg | DELAYED_RELEASE_TABLET | Freq: Two times a day (BID) | ORAL | Status: DC
  Administered 2014-07-06 – 2014-07-07 (×2): 40 mg via ORAL
  Filled 2014-07-06 (×3): qty 1

## 2014-07-06 MED ORDER — ACETAMINOPHEN 325 MG PO TABS
650.0000 mg | ORAL_TABLET | Freq: Once | ORAL | Status: AC
  Administered 2014-07-06: 650 mg via ORAL
  Filled 2014-07-06: qty 2

## 2014-07-06 MED ORDER — BUTAMBEN-TETRACAINE-BENZOCAINE 2-2-14 % EX AERO
INHALATION_SPRAY | CUTANEOUS | Status: DC | PRN
  Administered 2014-07-06: 1 via TOPICAL

## 2014-07-06 NOTE — Progress Notes (Signed)
[facility] PROGRESS NOTE  [name] [id number] DOB: [birth year] DOA: 07/05/2014 PCP: [name] Fendt, MD  Assessment/Plan: Upper GI bleed POSSIBLY from AVM;s: S/p EGD, showing no active bleeding. PPI tonight.   Insulin dependent DM: hgba1c is pending.  Resume lantus and SSI.    Acute renal failure: - possibly from a combination of dehydration and GI bleed. Holding lisinopril.    Leukocytosis: Probably reactive. Repeat in am.   Code Status: full code.  Family Communication: family at bedside Disposition Plan: possibly home tomorrow.    Consultants:  GI  Procedures:  EGD on 5/27  Antibiotics:  none  HPI/Subjective: No new complaints.  Objective: Filed Vitals:   07/06/14 0800  BP: 144/36  Pulse: 64  Temp:   Resp: 18    Intake/Output Summary (Last 24 hours) at 07/06/14 0835 Last data filed at 07/06/14 0600  Gross per 24 hour  Intake 957.67 ml  Output    600 ml  Net 357.67 ml   Filed Weights   07/06/14 0800  Weight: 71.7 kg (158 lb 1.1 oz)    Exam:   General:  Alert afebrile comfortable  Cardiovascular: s12  Respiratory: ctab  Abdomen: soft non tender non distended bowel sounds heard  Musculoskeletal: no pedal edema.   Data Reviewed: Basic Metabolic Panel:  Recent Labs Lab 07/05/14 1206  NA 140  K 3.9  CL 104  CO2 22  GLUCOSE 477*  BUN 32*  CREATININE 1.30*  CALCIUM 8.7*   Liver Function Tests:  Recent Labs Lab 07/05/14 1206  AST 24  ALT 19  ALKPHOS 68  BILITOT 0.8  PROT 5.2*  ALBUMIN 3.2*   No results for input(s): LIPASE, AMYLASE in the last 168 hours. No results for input(s): AMMONIA in the last 168 hours. CBC:  Recent Labs Lab 07/04/14 0841 07/05/14 1206 07/06/14 0400  WBC 5.7 12.4* 20.6*  NEUTROABS 3.9  --   --   HGB 9.5* 7.1* 11.8*  HCT 29.3* 22.5* 36.8  MCV 83.7 86.5 91.5  PLT 184 185 364   Cardiac Enzymes: No results for input(s): CKTOTAL, CKMB, CKMBINDEX, TROPONINI in the last 168  hours. BNP (last 3 results) No results for input(s): BNP in the last 8760 hours.  ProBNP (last 3 results)  Recent Labs  10/03/13 1400  PROBNP 155.7*    CBG:  Recent Labs Lab 07/05/14 1146 07/05/14 1504 07/05/14 1835 07/05/14 2141 07/06/14 0809  GLUCAP 382* 382* 316* 370* 175*    Recent Results (from the past 240 hour(s))  MRSA PCR Screening     Status: None   Collection Time: 07/05/14 11:42 AM  Result Value Ref Range Status   MRSA by PCR NEGATIVE NEGATIVE Final    Comment:        The GeneXpert MRSA Assay (FDA approved for NASAL specimens only), is one component of a comprehensive MRSA colonization surveillance program. It is not intended to diagnose MRSA infection nor to guide or monitor treatment for MRSA infections.      Studies: No results found.  Scheduled Meds: . insulin aspart  0-15 Units Subcutaneous TID WC  . insulin glargine  15 Units Subcutaneous QHS  . [START ON 07/09/2014] pantoprazole (PROTONIX) IV  40 mg Intravenous Q12H   Continuous Infusions: . sodium chloride 10 mL/hr at 07/06/14 0224  . pantoprozole (PROTONIX) infusion 8 mg/hr (07/06/14 0220)    Active Problems:   GI bleed   Upper GI bleed    Time spent: 25 minutes.     [name]  [facility] Pager [phone] If 7PM-7AM, please contact night-coverage at [email or web address], password [facility] 07/06/2014, 8:35 AM  LOS: 1 day

## 2014-07-06 NOTE — Progress Notes (Signed)
  Echocardiogram 2D Echocardiogram has been performed.  [name] 07/06/2014, 2:32 PM

## 2014-07-06 NOTE — Op Note (Signed)
[facility] [place], [address]   ENDOSCOPY PROCEDURE REPORT  PATIENT: [name], [name]  MR#: [id number] BIRTHDATE: [birth year] , 74  yrs. old GENDER: female ENDOSCOPIST: Clarene Essex, MD REFERRED BY: PROCEDURE DATE:  Aug 02, 2014 PROCEDURE:  EGD w/ control of bleeding  using APC ASA CLASS:     Class II INDICATIONS:  hematemesis. MEDICATIONS: Fentanyl 50 mcg IV and Versed 4 mg IV TOPICAL ANESTHETIC: Cetacaine Spray  DESCRIPTION OF PROCEDURE: After the risks benefits and alternatives of the procedure were thoroughly explained, informed consent was obtained.  The [id number] ) endoscope was introduced through the mouth and advanced to the second portion of the duodenum , Without limitations.  The instrument was slowly withdrawn as the mucosa was fully examined. Estimated blood loss is zero unless otherwise noted in this procedure report.    the findings are recorded below       Retroflexed views revealed a hiatal hernia.     The scope was then withdrawn from the patient and the procedure completed.  COMPLICATIONS: There were no immediate complications.  ENDOSCOPIC IMPRESSION: 1. Small hiatal hernia 2. One medium size mid greater curve AVM with central white spot status post APC in the customary fashion 2. Old clip along lesser curve      3. C-loop serpiginous AVMs 4. Otherwise within normal limits to the second portion of the duodenum without active bleeding  RECOMMENDATIONS: continue pump inhibitors soft solids hopefully home soon and can move out of ICU and follow-up with my partner Dr. [name] Plenty to set up outpatient colonoscopy  REPEAT EXAM: as needed  eSigned:  Clarene Essex, MD 02-Aug-2014 9:58 AM    CC:  CPT CODES: ICD CODES:  The ICD and CPT codes recommended by this software are interpretations from the data that the clinical staff has captured with the software.  The verification of the translation of this report to the ICD and  CPT codes and modifiers is the sole responsibility of the health care institution and practicing physician where this report was generated.  [employer] will not be held responsible for the validity of the ICD and CPT codes included on this report.  AMA assumes no liability for data contained or not contained herein. CPT is a Designer, television/film set of the [employer].  PATIENT NAME:  [name], [name] MR#: [id number]

## 2014-07-06 NOTE — Consult Note (Signed)
Reason for Consult: Upper GI bleeding Referring Physician: Hospital team  [name] is an 74 y.o. female.  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 Anemia   . S/P endoscopy     with laser treatment  08/19/2006  . History of epistaxis   . Hypertension   . Hyperlipidemia   . Telangiectasia     Gastric   . Lichen planus     Both lower extremities  . GERD (gastroesophageal reflux disease)   . Heart murmur   . HTN (hypertension), benign 03/02/2012  . CHF (congestive heart failure)   . Pneumonia 1990's    "twice" (09/15/2012)  . Type II diabetes mellitus     insulin requiring.  Marland Kitchen History of blood transfusion     "getting 2 today; have had transfusions before" (09/15/2012)  . Family history of anesthesia complication     "niece has a hard time coming out" (09/15/2012)    Past Surgical History  Procedure Laterality Date  . Nose surgery      "for bleeding" (09/15/2012)  . Savory dilation  02/26/2011    Procedure: SAVORY DILATION;  Surgeon: [name], MD;  Location: [name];  Service: Endoscopy;  Laterality: N/A;  c-arm needed  . Esophagogastroduodenoscopy  02/26/2011    Procedure: ESOPHAGOGASTRODUODENOSCOPY (EGD);  Surgeon: [name], MD;  Location: [name];  Service: Endoscopy;  Laterality: N/A;  . Esophagogastroduodenoscopy N/A 11/08/2012    Procedure: ESOPHAGOGASTRODUODENOSCOPY  (EGD);  Surgeon: [name], MD;  Location: [name];  Service: Endoscopy;  Laterality: N/A;  . Cataract surgery    . Esophagogastroduodenoscopy N/A 10/04/2013    Procedure: ESOPHAGOGASTRODUODENOSCOPY (EGD);  Surgeon: Winfield Cunas., MD;  Location: [name];  Service: Endoscopy;  Laterality: N/A;  with APC on stand-by    Family History  Problem Relation Age of Onset  . Breast cancer    . Malignant hyperthermia Neg Hx   . Stroke Father     Social History:  reports that she quit smoking about 43 years ago. Her smoking use included Cigarettes. She quit after 2 years of use. She has never used smokeless tobacco. She reports that she does not drink alcohol or use illicit drugs.  Allergies:  Allergies  Allergen Reactions  . Aspirin Nausea And Vomiting    Medications: I have reviewed the patient's current medications.  Results for orders placed or performed during the hospital encounter of 07/05/14 (from the past 48 hour(s))  MRSA PCR Screening     Status: None   Collection Time: 07/05/14 11:42 AM  Result Value Ref Range   MRSA by PCR NEGATIVE NEGATIVE    Comment:        The GeneXpert MRSA Assay (FDA approved for NASAL specimens only), is one component of a comprehensive MRSA colonization surveillance program. It is not intended to diagnose  MRSA infection nor to guide or monitor treatment for MRSA infections.   CBG monitoring, ED     Status: Abnormal   Collection Time: 07/05/14 11:46 AM  Result Value Ref Range   Glucose-Capillary 382 (H) 65 - 99 mg/dL   Comment 1 Notify RN    Comment 2 Document in Chart   CBC     Status: Abnormal   Collection Time: 07/05/14 12:06 PM  Result Value Ref Range   WBC 12.4 (H) 4.0 - 10.5 K/uL   RBC 2.60 (L) 3.87 - 5.11 MIL/uL   Hemoglobin 7.1 (L) 12.0 - 15.0 g/dL   HCT 22.5 (L) 36.0 - 46.0 %   MCV 86.5 78.0 - 100.0 fL   MCH 27.3 26.0 - 34.0 pg   MCHC 31.6 30.0 - 36.0 g/dL   RDW 25.1 (H) 11.5 - 15.5 %   Platelets 185 150 -  400 K/uL    Comment: REPEATED TO VERIFY  Comprehensive metabolic panel     Status: Abnormal   Collection Time: 07/05/14 12:06 PM  Result Value Ref Range   Sodium 140 135 - 145 mmol/L   Potassium 3.9 3.5 - 5.1 mmol/L   Chloride 104 101 - 111 mmol/L   CO2 22 22 - 32 mmol/L   Glucose, Bld 477 (H) 65 - 99 mg/dL   BUN 32 (H) 6 - 20 mg/dL   Creatinine, Ser 1.30 (H) 0.44 - 1.00 mg/dL   Calcium 8.7 (L) 8.9 - 10.3 mg/dL   Total Protein 5.2 (L) 6.5 - 8.1 g/dL   Albumin 3.2 (L) 3.5 - 5.0 g/dL   AST 24 15 - 41 U/L   ALT 19 14 - 54 U/L   Alkaline Phosphatase 68 38 - 126 U/L   Total Bilirubin 0.8 0.3 - 1.2 mg/dL   GFR calc non Af Amer 39 (L) >60 mL/min   GFR calc Af Amer 46 (L) >60 mL/min    Comment: (NOTE) The eGFR has been calculated using the CKD EPI equation. This calculation has not been validated in all clinical situations. eGFR's persistently <60 mL/min signify possible Chronic Kidney Disease.    Anion gap 14 5 - 15  CBG monitoring, ED     Status: Abnormal   Collection Time: 07/05/14  3:04 PM  Result Value Ref Range   Glucose-Capillary 382 (H) 65 - 99 mg/dL  Urinalysis, Routine w reflex microscopic     Status: Abnormal   Collection Time: 07/05/14  3:11 PM  Result Value Ref Range   Color, Urine YELLOW YELLOW   APPearance CLEAR CLEAR   Specific Gravity, Urine 1.024 1.005 - 1.030   pH 5.5 5.0 - 8.0   Glucose, UA >1000 (A) NEGATIVE mg/dL   Hgb urine dipstick MODERATE (A) NEGATIVE   Bilirubin Urine NEGATIVE NEGATIVE   Ketones, ur NEGATIVE NEGATIVE mg/dL   Protein, ur NEGATIVE NEGATIVE mg/dL   Urobilinogen, UA 0.2 0.0 - 1.0 mg/dL   Nitrite NEGATIVE NEGATIVE   Leukocytes, UA NEGATIVE NEGATIVE  Urine microscopic-add on     Status: None   Collection Time: 07/05/14  3:11 PM  Result Value Ref Range   Squamous Epithelial / LPF RARE RARE   RBC / HPF 0-2 <3 RBC/hpf   Bacteria, UA RARE RARE  Type and screen     Status: None   Collection Time: 07/05/14  4:04 PM  Result Value Ref Range    ABO/RH(D) A POS    Antibody Screen NEG    Sample Expiration 07/08/2014  Unit Number [id number]    Blood Component Type RED CELLS,LR    Unit division 00    Status of Unit ISSUED,FINAL    Transfusion Status OK TO TRANSFUSE    Crossmatch Result Compatible    Unit Number [id number]    Blood Component Type RBC CPDA1, LR    Unit division 00    Status of Unit ISSUED,FINAL    Transfusion Status OK TO TRANSFUSE    Crossmatch Result Compatible   Protime-INR     Status: Abnormal   Collection Time: 07/05/14  4:04 PM  Result Value Ref Range   Prothrombin Time 15.6 (H) 11.6 - 15.2 seconds   INR 1.22 0.00 - 1.49  Prepare RBC     Status: None   Collection Time: 07/05/14  4:04 PM  Result Value Ref Range   Order Confirmation ORDER PROCESSED BY BLOOD BANK   Glucose, capillary     Status: Abnormal   Collection Time: 07/05/14  6:35 PM  Result Value Ref Range   Glucose-Capillary 316 (H) 65 - 99 mg/dL   Comment 1 Document in Chart    Comment 2 Repeat Test   Glucose, capillary     Status: Abnormal   Collection Time: 07/05/14  9:41 PM  Result Value Ref Range   Glucose-Capillary 370 (H) 65 - 99 mg/dL   Comment 1 Notify RN    Comment 2 Document in Chart   CBC     Status: Abnormal   Collection Time: 07/06/14  4:00 AM  Result Value Ref Range   WBC 20.6 (H) 4.0 - 10.5 K/uL   RBC 4.02 3.87 - 5.11 MIL/uL   Hemoglobin 11.8 (L) 12.0 - 15.0 g/dL    Comment: DELTA CHECK NOTED REPEATED TO VERIFY POST TRANSFUSION SPECIMEN    HCT 36.8 36.0 - 46.0 %   MCV 91.5 78.0 - 100.0 fL   MCH 29.4 26.0 - 34.0 pg   MCHC 32.1 30.0 - 36.0 g/dL   RDW 14.5 11.5 - 15.5 %   Platelets 364 150 - 400 K/uL  Glucose, capillary     Status: Abnormal   Collection Time: 07/06/14  8:09 AM  Result Value Ref Range   Glucose-Capillary 175 (H) 65 - 99 mg/dL   Comment 1 Notify RN    Comment 2 Document in Chart     No results found.  ROSNegative except above although she does periodically see some bright red blood in  her stool as well  Blood pressure 140/42, pulse 67, temperature 98.3 F (36.8 C), temperature source Oral, resp. rate 16, height 5' 1" (1.549 m), weight 71.7 kg (158 lb 1.1 oz), SpO2 97 %. Physical ExamVital signs stable afebrile no acute distress exam please see preassessment evaluation hemoglobin increased nicely with transfusion white count increased as well questionable significance other labs reviewed  Assessment/Plan: Up her GI bleeding in a patient with known AVMs Plan: We rediscussed endoscopy and will proceed today with further workup and plans pending those findings and she will call my partner Dr. Doristine Locks week to set up an outpatient colonoscopy and check on out of pocket costs as well  , E 07/06/2014, 9:29 AM

## 2014-07-07 LAB — CBC
HCT: 27.8 % — ABNORMAL LOW (ref 36.0–46.0)
HCT: 29.7 % — ABNORMAL LOW (ref 36.0–46.0)
Hemoglobin: 8.9 g/dL — ABNORMAL LOW (ref 12.0–15.0)
Hemoglobin: 9.6 g/dL — ABNORMAL LOW (ref 12.0–15.0)
MCH: 28.4 pg (ref 26.0–34.0)
MCH: 28.2 pg (ref 26.0–34.0)
MCHC: 32 g/dL (ref 30.0–36.0)
MCHC: 32.3 g/dL (ref 30.0–36.0)
MCV: 88.8 fL (ref 78.0–100.0)
MCV: 87.4 fL (ref 78.0–100.0)
Platelets: 177 10*3/uL (ref 150–400)
Platelets: 210 10*3/uL (ref 150–400)
RBC: 3.13 MIL/uL — AB (ref 3.87–5.11)
RBC: 3.4 MIL/uL — ABNORMAL LOW (ref 3.87–5.11)
RDW: 20.3 % — AB (ref 11.5–15.5)
RDW: 19.6 % — ABNORMAL HIGH (ref 11.5–15.5)
WBC: 5.7 10*3/uL (ref 4.0–10.5)
WBC: 6.5 10*3/uL (ref 4.0–10.5)

## 2014-07-07 LAB — GLUCOSE, CAPILLARY
GLUCOSE-CAPILLARY: 343 mg/dL — AB (ref 65–99)
Glucose-Capillary: 271 mg/dL — ABNORMAL HIGH (ref 65–99)
Glucose-Capillary: 170 mg/dL — ABNORMAL HIGH (ref 65–99)

## 2014-07-07 LAB — BASIC METABOLIC PANEL
Anion gap: 6 (ref 5–15)
BUN: 16 mg/dL (ref 6–20)
CALCIUM: 8.3 mg/dL — AB (ref 8.9–10.3)
CO2: 27 mmol/L (ref 22–32)
Chloride: 106 mmol/L (ref 101–111)
Creatinine, Ser: 0.82 mg/dL (ref 0.44–1.00)
GFR calc Af Amer: >60 mL/min (ref >60)
GLUCOSE: 400 mg/dL — AB (ref 65–99)
Potassium: 3.8 mmol/L (ref 3.5–5.1)
Sodium: 139 mmol/L (ref 135–145)

## 2014-07-07 MED ORDER — PANTOPRAZOLE SODIUM 40 MG PO TBEC: 40.0000 mg | DELAYED_RELEASE_TABLET | Freq: Two times a day (BID) | ORAL | Status: DC

## 2014-07-07 NOTE — Progress Notes (Signed)
GASTROENTEROLOGY PROGRESS NOTE  Problem:   Hematemesis.  Post-hemorrhagic anemia.  Underlying vascular ectasia syndrome.  Subjective:  1 BM today, no  hematemesis, feels well   Objective: Hemoglobin has dropped overnight from 11.8-8.9, but BUN this morning is normal at 16   Assessment: I believe that the drop in hemoglobin reflects equilibration rather than any ongoing bleeding   Plan: Recheck hemoglobin this afternoon and consider discharge if stable, which the patient and family are in favor of. If hemoglobin this afternoon has dropped below 8.5, I would observe the patient overnight and recheck a hemoglobin tomorrow.  I would favor follow-up with the patient's primary gastroenterologist, Dr. Teena Irani, sometime in the next couple of weeks to recheck a blood count and answer a number the questions that the family have pertaining to exact diagnosis, long-term therapeutic alternatives, and so forth.  Cleotis Nipper, M.D. 07/07/2014 12:25 PM  Pager [phone] If no answer or after 5 PM call [phone]

## 2014-07-07 NOTE — Progress Notes (Signed)
Pt left at this time with her spouse and daughter. Pt alert, oriented, and without c/o. Discharge instructions/prescription given/explained with pt verbalizing understanding. Followup appointments noted.

## 2014-07-07 NOTE — Discharge Summary (Addendum)
Physician Discharge Summary  [name] [id number] DOB: 1940-02-20 DOA: 07/05/2014  PCP: Philis Fendt, MD  Admit date: 07/05/2014 Discharge date: 07/07/2014  Time spent: 30 minutes  Recommendations for Outpatient Follow-up:  1. Follow up with PCP in one week.  2. Follow up with Dr [name] Plenty in [place] 2 weeks.  3. Follow up with repeat CBC in one week.  4. Follow up with cardiology to discuss the echocardiogram.   Discharge Diagnoses:  Active Problems:   GI bleed   Upper GI bleed   Discharge Condition: improved.   Diet recommendation: low sodium diet.   Filed Weights   07/06/14 0800  Weight: 71.7 kg (158 lb 1.1 oz)    History of present illness:  [name] is a 74 y.o. female   Is a very pleasant female with know history of osler-weber-rendu disease, Gastric AVM, chronic iron deficiency anemia requiring intermittent iron transfusion and blood transfusion, presented to ED due to sudden onset of vomiting blood x multiple times started from 2am, patient is not able to quantitate the amount, but reported seeing blood clot in vomits. She also reported hematochezia. She was admitted for upper gi bleed evaluation and GI consulted. Underwent EGD, showed no active bleeding and is being discharged home to follow up with GI as outpatient.   Hospital Course:  Upper GI bleed POSSIBLY from AVM;s: S/p EGD, showing no active bleeding. PPI tonight and plan to d/c home with BID PPI  Insulin dependent DM:  Resume lONG ACTING insulin at home dose.    Acute renal failure: - possibly from a combination of dehydration and GI bleed.  Much improved with hdyration.    Leukocytosis: Probably reactive. Repeat in am shows normal wbc count. .   Abnormal echocardiogram: asyymmetric hypertrophy of the septum, vigorous LVEF and grade 2 diastolic dysfunction. Follow up with cardiology in one week.    Procedures:  EGD  Consultations:  gi  Discharge Exam: Filed Vitals:   07/07/14  0630  BP: 121/55  Pulse: 53  Temp: 98.2 F (36.8 C)  Resp: 18    General: alert afebrile comfortable Cardiovascular: s1s2 Respiratory: ctab  Discharge Instructions   Discharge Instructions    Diet - low sodium heart healthy    Complete by:  As directed      Discharge instructions    Complete by:  As directed   Please follow up with Dr [name] Plenty in one to two weeks and get CBC to check hemoglobin.  Please call Dr [name] Plenty office or come to ED, if you have recurrent bleeding.          Current Discharge Medication List    CONTINUE these medications which have CHANGED   Details  pantoprazole (PROTONIX) 40 MG tablet Take 1 tablet (40 mg total) by mouth 2 (two) times daily. Qty: 60 tablet, Refills: 1      CONTINUE these medications which have NOT CHANGED   Details  amitriptyline (ELAVIL) 25 MG tablet Take 1 tablet by mouth at bedtime.    enalapril (VASOTEC) 20 MG tablet Take 20 mg by mouth daily.    insulin detemir (LEVEMIR) 100 UNIT/ML injection Inject 0.3 mLs (30 Units total) into the skin at bedtime. Qty: 10 mL, Refills: 11    oxymetazoline (AFRIN) 0.05 % nasal spray Place 1 spray into both nostrils 2 (two) times daily as needed (Epistaxis). Qty: 30 mL, Refills: 0    vitamin B-12 (CYANOCOBALAMIN) 1000 MCG tablet Take 1,000 mcg by mouth at bedtime.  Vitamin D, Ergocalciferol, (DRISDOL) 50000 UNITS CAPS Take 50,000 Units by mouth every 7 (seven) days. Takes on Monday       Allergies  Allergen Reactions  . Aspirin Nausea And Vomiting   Follow-up Information    Follow up with HAYES,[name] C, MD. Schedule an appointment as soon as possible for a visit in 1 week.   Specialty:  Gastroenterology   Why:  to check CBC and follow upw on recent GI bleed.   Contact information:   [address] [place] [address] [phone]        The results of significant diagnostics from this hospitalization (including imaging, microbiology, ancillary and laboratory)  are listed below for reference.    Significant Diagnostic Studies: No results found.  Microbiology: Recent Results (from the past 240 hour(s))  MRSA PCR Screening     Status: None   Collection Time: 07/05/14 11:42 AM  Result Value Ref Range Status   MRSA by PCR NEGATIVE NEGATIVE Final    Comment:        The GeneXpert MRSA Assay (FDA approved for NASAL specimens only), is one component of a comprehensive MRSA colonization surveillance program. It is not intended to diagnose MRSA infection nor to guide or monitor treatment for MRSA infections.      Labs: Basic Metabolic Panel:  Recent Labs Lab 07/05/14 1206 07/07/14 0527  NA 140 139  K 3.9 3.8  CL 104 106  CO2 22 27  GLUCOSE 477* 400*  BUN 32* 16  CREATININE 1.30* 0.82  CALCIUM 8.7* 8.3*   Liver Function Tests:  Recent Labs Lab 07/05/14 1206  AST 24  ALT 19  ALKPHOS 68  BILITOT 0.8  PROT 5.2*  ALBUMIN 3.2*   No results for input(s): LIPASE, AMYLASE in the last 168 hours. No results for input(s): AMMONIA in the last 168 hours. CBC:  Recent Labs Lab 07/04/14 0841 07/05/14 1206 07/06/14 0400 07/07/14 0527 07/07/14 1355  WBC 5.7 12.4* 20.6* 5.7 6.5  NEUTROABS 3.9  --   --   --   --   HGB 9.5* 7.1* 11.8* 8.9* 9.6*  HCT 29.3* 22.5* 36.8 27.8* 29.7*  MCV 83.7 86.5 91.5 88.8 87.4  PLT 184 185 364 177 210   Cardiac Enzymes: No results for input(s): CKTOTAL, CKMB, CKMBINDEX, TROPONINI in the last 168 hours. BNP: BNP (last 3 results) No results for input(s): BNP in the last 8760 hours.  ProBNP (last 3 results)  Recent Labs  10/03/13 1400  PROBNP 155.7*    CBG:  Recent Labs Lab 07/06/14 1209 07/06/14 1725 07/06/14 2147 07/07/14 0739 07/07/14 1132  GLUCAP 170* 198* 271* 343* 170*       Signed:  Lakeidra Reliford  [facility] 07/07/2014, 3:03 PM

## 2014-07-07 NOTE — Progress Notes (Signed)
Addendum to previous note (please see):  On exam, the patient is sitting up in bed in absolutely no distress, cognitively intact although slightly hard of hearing, vital signs are stable with a lowish heart rate and blood pressure, skin is warm and dry, radial pulse is full.  Cleotis Nipper, M.D. Pager [phone] If no answer or after 5 PM call [phone]

## 2014-07-10 ENCOUNTER — Encounter (HOSPITAL_COMMUNITY): Payer: Self-pay | Admitting: Gastroenterology

## 2014-07-10 DIAGNOSIS — I780 Hereditary hemorrhagic telangiectasia: Secondary | ICD-10-CM | POA: Diagnosis not present

## 2014-07-10 DIAGNOSIS — I10 Essential (primary) hypertension: Secondary | ICD-10-CM | POA: Diagnosis not present

## 2014-07-10 DIAGNOSIS — E1140 Type 2 diabetes mellitus with diabetic neuropathy, unspecified: Secondary | ICD-10-CM | POA: Diagnosis not present

## 2014-07-10 DIAGNOSIS — D509 Iron deficiency anemia, unspecified: Secondary | ICD-10-CM | POA: Diagnosis not present

## 2014-07-12 ENCOUNTER — Other Ambulatory Visit: Payer: Self-pay | Admitting: Physician Assistant

## 2014-07-13 ENCOUNTER — Other Ambulatory Visit: Payer: Self-pay | Admitting: Oncology

## 2014-07-13 ENCOUNTER — Ambulatory Visit (HOSPITAL_COMMUNITY)
Admission: RE | Admit: 2014-07-13 | Discharge: 2014-07-13 | Disposition: A | Discharge status: Home / Self Care | Payer: Medicare Other | Source: Ambulatory Visit | Attending: Oncology | Admitting: Oncology

## 2014-07-13 ENCOUNTER — Encounter (HOSPITAL_COMMUNITY): Payer: Self-pay

## 2014-07-13 DIAGNOSIS — L439 Lichen planus, unspecified: Secondary | ICD-10-CM | POA: Insufficient documentation

## 2014-07-13 DIAGNOSIS — E119 Type 2 diabetes mellitus without complications: Secondary | ICD-10-CM | POA: Diagnosis not present

## 2014-07-13 DIAGNOSIS — I878 Other specified disorders of veins: Secondary | ICD-10-CM | POA: Diagnosis not present

## 2014-07-13 DIAGNOSIS — Z8701 Personal history of pneumonia (recurrent): Secondary | ICD-10-CM | POA: Diagnosis not present

## 2014-07-13 DIAGNOSIS — Z452 Encounter for adjustment and management of vascular access device: Secondary | ICD-10-CM | POA: Diagnosis not present

## 2014-07-13 DIAGNOSIS — I10 Essential (primary) hypertension: Secondary | ICD-10-CM | POA: Insufficient documentation

## 2014-07-13 DIAGNOSIS — R011 Cardiac murmur, unspecified: Secondary | ICD-10-CM | POA: Diagnosis not present

## 2014-07-13 DIAGNOSIS — Q2733 Arteriovenous malformation of digestive system vessel: Secondary | ICD-10-CM | POA: Insufficient documentation

## 2014-07-13 DIAGNOSIS — I781 Nevus, non-neoplastic: Secondary | ICD-10-CM | POA: Insufficient documentation

## 2014-07-13 DIAGNOSIS — K31819 Angiodysplasia of stomach and duodenum without bleeding: Secondary | ICD-10-CM

## 2014-07-13 DIAGNOSIS — Z87891 Personal history of nicotine dependence: Secondary | ICD-10-CM | POA: Diagnosis not present

## 2014-07-13 DIAGNOSIS — Z794 Long term (current) use of insulin: Secondary | ICD-10-CM | POA: Diagnosis not present

## 2014-07-13 DIAGNOSIS — E785 Hyperlipidemia, unspecified: Secondary | ICD-10-CM | POA: Diagnosis not present

## 2014-07-13 DIAGNOSIS — I509 Heart failure, unspecified: Secondary | ICD-10-CM | POA: Diagnosis not present

## 2014-07-13 DIAGNOSIS — D500 Iron deficiency anemia secondary to blood loss (chronic): Secondary | ICD-10-CM | POA: Diagnosis not present

## 2014-07-13 DIAGNOSIS — K219 Gastro-esophageal reflux disease without esophagitis: Secondary | ICD-10-CM | POA: Insufficient documentation

## 2014-07-13 DIAGNOSIS — I780 Hereditary hemorrhagic telangiectasia: Secondary | ICD-10-CM | POA: Diagnosis not present

## 2014-07-13 LAB — CBC WITH DIFFERENTIAL/PLATELET
Basophils Absolute: 0 10*3/uL (ref 0.0–0.1)
Basophils Relative: 0 % (ref 0–1)
EOS ABS: 0.2 10*3/uL (ref 0.0–0.7)
Eosinophils Relative: 4 % (ref 0–5)
HCT: 26.9 % — ABNORMAL LOW (ref 36.0–46.0)
Hemoglobin: 8.6 g/dL — ABNORMAL LOW (ref 12.0–15.0)
LYMPHS PCT: 10 % — AB (ref 12–46)
Lymphs Abs: 0.5 10*3/uL — ABNORMAL LOW (ref 0.7–4.0)
MCH: 28.1 pg (ref 26.0–34.0)
MCHC: 32 g/dL (ref 30.0–36.0)
MCV: 87.9 fL (ref 78.0–100.0)
MONOS PCT: 9 % (ref 3–12)
Monocytes Absolute: 0.5 10*3/uL (ref 0.1–1.0)
NEUTROS PCT: 77 % (ref 43–77)
Neutro Abs: 4.1 10*3/uL (ref 1.7–7.7)
Platelets: 294 10*3/uL (ref 150–400)
RBC: 3.06 MIL/uL — AB (ref 3.87–5.11)
RDW: 18 % — ABNORMAL HIGH (ref 11.5–15.5)
WBC: 5.3 10*3/uL (ref 4.0–10.5)

## 2014-07-13 LAB — PROTIME-INR
INR: 1.08 (ref 0.00–1.49)
Prothrombin Time: 14.2 seconds (ref 11.6–15.2)

## 2014-07-13 LAB — GLUCOSE, CAPILLARY: Glucose-Capillary: 234 mg/dL — ABNORMAL HIGH (ref 65–99)

## 2014-07-13 LAB — APTT: aPTT: 31 seconds (ref 24–37)

## 2014-07-13 MED ORDER — CEFAZOLIN SODIUM-DEXTROSE 2-3 GM-% IV SOLR
INTRAVENOUS | Status: AC
  Filled 2014-07-13: qty 50

## 2014-07-13 MED ORDER — LIDOCAINE-EPINEPHRINE 2 %-1:100000 IJ SOLN
INTRAMUSCULAR | Status: AC
  Filled 2014-07-13: qty 1

## 2014-07-13 MED ORDER — MIDAZOLAM HCL 2 MG/2ML IJ SOLN
INTRAMUSCULAR | Status: AC
  Filled 2014-07-13: qty 6

## 2014-07-13 MED ORDER — SODIUM CHLORIDE 0.9 % IV SOLN
INTRAVENOUS | Status: DC
Start: 2014-07-13 — End: 2014-07-14
  Administered 2014-07-13: 13:00:00 via INTRAVENOUS

## 2014-07-13 MED ORDER — HEPARIN SOD (PORK) LOCK FLUSH 100 UNIT/ML IV SOLN
INTRAVENOUS | Status: AC
  Filled 2014-07-13: qty 5

## 2014-07-13 MED ORDER — LIDOCAINE HCL 1 % IJ SOLN
INTRAMUSCULAR | Status: DC
Start: 2014-07-13 — End: 2014-07-14
  Filled 2014-07-13: qty 20

## 2014-07-13 MED ORDER — HEPARIN SOD (PORK) LOCK FLUSH 100 UNIT/ML IV SOLN
INTRAVENOUS | Status: AC | PRN
  Administered 2014-07-13: 500 [IU]

## 2014-07-13 MED ORDER — FENTANYL CITRATE (PF) 100 MCG/2ML IJ SOLN
INTRAMUSCULAR | Status: AC | PRN
  Administered 2014-07-13: 50 ug via INTRAVENOUS

## 2014-07-13 MED ORDER — MIDAZOLAM HCL 2 MG/2ML IJ SOLN
INTRAMUSCULAR | Status: AC | PRN
  Administered 2014-07-13 (×2): 1 mg via INTRAVENOUS

## 2014-07-13 MED ORDER — CEFAZOLIN SODIUM-DEXTROSE 2-3 GM-% IV SOLR
2.0000 g | INTRAVENOUS | Status: AC
  Administered 2014-07-13: 2 g via INTRAVENOUS

## 2014-07-13 MED ORDER — FENTANYL CITRATE (PF) 100 MCG/2ML IJ SOLN
INTRAMUSCULAR | Status: AC
  Filled 2014-07-13: qty 4

## 2014-07-13 NOTE — H&P (Signed)
Chief Complaint: "I'm having a port catheter put in"  Referring Physician(s): Granfortuna,James M  History of Present Illness: [name] is a 74 y.o. female with history of Osler Weber Rendu disease, gastric AVM with recent upper GI bleed and chronic iron deficiency requiring intermittent iron and blood transfusions. She has a history of poor venous access and request has now been placed for Port-A-Cath placement for future transfusions.  Past Medical History  Diagnosis Date  . Anemia   . S/P endoscopy     with laser treatment  08/19/2006  . History of epistaxis   . Hypertension   . Hyperlipidemia   . Telangiectasia     Gastric   . Lichen planus     Both lower extremities  . GERD (gastroesophageal reflux disease)   . Heart murmur   . HTN (hypertension), benign 03/02/2012  . CHF (congestive heart failure)   . Pneumonia 1990's    "twice" (09/15/2012)  . Type II diabetes mellitus     insulin requiring.  Marland Kitchen History of blood transfusion     "getting 2 today; have had transfusions before" (09/15/2012)  . Family history of anesthesia complication     "niece has a hard time coming out" (09/15/2012)    Past Surgical History  Procedure Laterality Date  . Nose surgery      "for bleeding" (09/15/2012)  . Savory dilation  02/26/2011    Procedure: SAVORY DILATION;  Surgeon: [name], MD;  Location: [name];  Service: Endoscopy;  Laterality: N/A;  c-arm needed  . Esophagogastroduodenoscopy  02/26/2011    Procedure: ESOPHAGOGASTRODUODENOSCOPY (EGD);  Surgeon: [name], MD;  Location: [name];  Service: Endoscopy;  Laterality: N/A;  . Esophagogastroduodenoscopy N/A 11/08/2012    Procedure: ESOPHAGOGASTRODUODENOSCOPY (EGD);  Surgeon: [name], MD;  Location: [name];  Service: Endoscopy;  Laterality: N/A;  . Cataract surgery    . Esophagogastroduodenoscopy N/A 10/04/2013    Procedure: ESOPHAGOGASTRODUODENOSCOPY (EGD);  Surgeon: Winfield Cunas., MD;   Location: [name];  Service: Endoscopy;  Laterality: N/A;  with APC on stand-by  . Esophagogastroduodenoscopy N/A 07/06/2014    Procedure: ESOPHAGOGASTRODUODENOSCOPY (EGD);  Surgeon: Clarene Essex, MD;  Location: [name];  Service: Endoscopy;  Laterality: N/A;  . Hot hemostasis N/A 07/06/2014    Procedure: HOT HEMOSTASIS (ARGON PLASMA COAGULATION/BICAP);  Surgeon: Clarene Essex, MD;  Location: [name];  Service: Endoscopy;  Laterality: N/A;    Allergies: Aspirin  Medications: Prior to Admission medications   Medication Sig Start Date End Date Taking? Authorizing Provider  amitriptyline (ELAVIL) 25 MG tablet Take 1 tablet by mouth at bedtime as needed for sleep.  07/04/14  Yes Historical Provider, MD  enalapril (VASOTEC) 20 MG tablet Take 20 mg by mouth every morning.    Yes Historical Provider, MD  insulin detemir (LEVEMIR) 100 UNIT/ML injection Inject 0.3 mLs (30 Units total) into the skin at bedtime. Patient taking differently: Inject 30-40 Units into the skin 2 (two) times daily. 40 units in the Morning, and 30 units at Bedtime 10/05/13  Yes Robbie Lis, MD  oxymetazoline (AFRIN) 0.05 % nasal spray Place 1 spray into both nostrils 2 (two) times daily as needed (Epistaxis). 10/05/13  Yes Robbie Lis, MD  pantoprazole (PROTONIX) 40 MG tablet Take 1 tablet (40 mg total) by mouth 2 (two) times daily. 07/07/14  Yes Hosie Poisson, MD  vitamin B-12 (CYANOCOBALAMIN) 1000 MCG tablet Take 1,000 mcg by mouth at bedtime.    Yes  Historical Provider, MD  Vitamin D, Ergocalciferol, (DRISDOL) 50000 UNITS CAPS Take 50,000 Units by mouth every 7 (seven) days. Takes on Monday    Historical Provider, MD    Family History  Problem Relation Age of Onset  . Breast cancer    . Malignant hyperthermia Neg Hx   . Stroke Father     History   Social History  . Marital Status: Married    Spouse Name: N/A  . Number of Children: 0  . Years of Education: 9   Occupational History  . Retired Facilities manager    Social History Main Topics  . Smoking status: Former Smoker -- 2 years    Types: Cigarettes    Quit date: 02/10/1971  . Smokeless tobacco: Never Used     Comment: 09/15/2012 "smoked 50-60 yr ago"  . Alcohol Use: No  . Drug Use: No  . Sexual Activity: Not on file   Other Topics Concern  . None   Social History Narrative   Married, lives with husband.  Ambulates without assistance.        Review of Systems  Constitutional: Positive for fatigue. Negative for fever and chills.  Respiratory: Negative for cough.        Occasional dyspnea with exertion  Cardiovascular: Negative for chest pain.  Gastrointestinal: Negative for nausea, vomiting, abdominal pain and blood in stool.  Genitourinary: Negative for dysuria and hematuria.  Musculoskeletal: Negative for back pain.  Neurological: Negative for headaches.    Vital Signs: BP 141/45 mmHg  Pulse 65  Temp(Src) 98.4 F (36.9 C) (Oral)  Resp 18  SpO2 99%  Physical Exam  Constitutional: She is oriented to person, place, and time. She appears well-developed and well-nourished.  Cardiovascular: Normal rate and regular rhythm.   Murmur heard. Pulmonary/Chest: Effort normal and breath sounds normal.  Abdominal: Soft. Bowel sounds are normal. There is no tenderness.  Musculoskeletal: Normal range of motion. She exhibits no edema.  Neurological: She is alert and oriented to person, place, and time.    Imaging: No results found.  Labs:  CBC:  Recent Labs  07/06/14 0400 07/07/14 0527 07/07/14 1355 07/13/14 1245  WBC 20.6* 5.7 6.5 5.3  HGB 11.8* 8.9* 9.6* 8.6*  HCT 36.8 27.8* 29.7* 26.9*  PLT 364 177 210 294    COAGS:  Recent Labs  07/05/14 1604 07/13/14 1245  INR 1.22 1.08  APTT  --  31    BMP:  Recent Labs  10/03/13 1331 10/04/13 0249 04/04/14 1019 07/05/14 1206 07/07/14 0527  NA 141 146 144 140 139  K 4.1 3.5* 4.5 3.9 3.8  CL 106 110  --  104 106  CO2 23 25 26 22 27   GLUCOSE 434* 58*  156* 477* 400*  BUN 26* 18 16.9 32* 16  CALCIUM 8.9 8.8 9.3 8.7* 8.3*  CREATININE 0.79 0.77 0.9 1.30* 0.82  GFRNONAA 81* 81*  --  39* >60  GFRAA >90 >90  --  46* >60    LIVER FUNCTION TESTS:  Recent Labs  09/08/13 1844 10/04/13 0249 04/04/14 1019 07/05/14 1206  BILITOT 0.3 0.5 0.43 0.8  AST 45* 34 30 24  ALT 44* 34 25 19  ALKPHOS 118* 95 140 68  PROT 7.0 5.2* 6.4 5.2*  ALBUMIN 3.7 2.7* 3.3* 3.2*    TUMOR MARKERS: No results for input(s): AFPTM, CEA, CA199, CHROMGRNA in the last 8760 hours.  Assessment and Plan: [name] is a 74 y.o. female with history of Osler Weber  Rendu disease, gastric AVM with recent upper GI bleed and chronic iron deficiency requiring intermittent iron and blood transfusions. She has a history of poor venous access and request has now been placed for Port-A-Cath placement for future transfusions.Risks and benefits discussed with the patient/husband including, but not limited to bleeding, infection, pneumothorax, or fibrin sheath development and need for additional procedures. All of the patient's questions were answered, patient is agreeable to proceed. Consent signed and in chart.     Signed: D. Rowe Robert 07/13/2014, 1:44 PM   I spent a total of 20 minutes face to face in clinical consultation, greater than 50% of which was counseling/coordinating care for port a cath placement

## 2014-07-13 NOTE — Progress Notes (Addendum)
Discharge Instructions Completed at 1615. Pt just waking up enough to tolerate POs. Husband at bedside.

## 2014-07-13 NOTE — Discharge Instructions (Signed)
Implanted Port Insertion, Care After °Refer to this sheet in the next few weeks. These instructions provide you with information on caring for yourself after your procedure. Your health care provider may also give you more specific instructions. Your treatment has been planned according to current medical practices, but problems sometimes occur. Call your health care provider if you have any problems or questions after your procedure. °WHAT TO EXPECT AFTER THE PROCEDURE °After your procedure, it is typical to have the following:  °· Discomfort at the port insertion site. Ice packs to the area will help. °· Bruising on the skin over the port. This will subside in 3-4 days. °HOME CARE INSTRUCTIONS °· After your port is placed, you will get a manufacturer's information card. The card has information about your port. Keep this card with you at all times.   °· Know what kind of port you have. There are many types of ports available.   °· Wear a medical alert bracelet in case of an emergency. This can help alert health care workers that you have a port.   °· The port can stay in for as long as your health care provider believes it is necessary.   °· A home health care nurse may give medicines and take care of the port.   °· You or a family member can get special training and directions for giving medicine and taking care of the port at home.   °SEEK MEDICAL CARE IF:  °· Your port does not flush or you are unable to get a blood return.   °· You have a fever or chills. °SEEK IMMEDIATE MEDICAL CARE IF: °· You have new fluid or pus coming from your incision.   °· You notice a bad smell coming from your incision site.   °· You have swelling, pain, or more redness at the incision or port site.   °· You have chest pain or shortness of breath. °Document Released: 11/16/2012 Document Revised: 01/31/2013 Document Reviewed: 11/16/2012 °ExitCare® Patient Information ©2015 [employer]. This information is not intended to replace  advice given to you by your health care provider. Make sure you discuss any questions you have with your health care provider. °Implanted Port Home Guide °An implanted port is a type of central line that is placed under the skin. Central lines are used to provide IV access when treatment or nutrition needs to be given through a person's veins. Implanted ports are used for long-term IV access. An implanted port may be placed because:  °· You need IV medicine that would be irritating to the small veins in your hands or arms.   °· You need long-term IV medicines, such as antibiotics.   °· You need IV nutrition for a long period.   °· You need frequent blood draws for lab tests.   °· You need dialysis.   °Implanted ports are usually placed in the chest area, but they can also be placed in the upper arm, the abdomen, or the leg. An implanted port has two main parts:  °· Reservoir. The reservoir is round and will appear as a small, raised area under your skin. The reservoir is the part where a needle is inserted to give medicines or draw blood.   °· Catheter. The catheter is a thin, flexible tube that extends from the reservoir. The catheter is placed into a large vein. Medicine that is inserted into the reservoir goes into the catheter and then into the vein.   °HOW WILL I CARE FOR MY INCISION SITE? °Do not get the incision site wet. Bathe or   shower as directed by your health care provider.  °HOW IS MY PORT ACCESSED? °Special steps must be taken to access the port:  °· Before the port is accessed, a numbing cream can be placed on the skin. This helps numb the skin over the port site.   °· Your health care provider uses a sterile technique to access the port. °· Your health care provider must put on a mask and sterile gloves. °· The skin over your port is cleaned carefully with an antiseptic and allowed to dry. °· The port is gently pinched between sterile gloves, and a needle is inserted into the port. °· Only  "non-coring" port needles should be used to access the port. Once the port is accessed, a blood return should be checked. This helps ensure that the port is in the vein and is not clogged.   °· If your port needs to remain accessed for a constant infusion, a clear (transparent) bandage will be placed over the needle site. The bandage and needle will need to be changed every week, or as directed by your health care provider.   °· Keep the bandage covering the needle clean and dry. Do not get it wet. Follow your health care provider's instructions on how to take a shower or bath while the port is accessed.   °· If your port does not need to stay accessed, no bandage is needed over the port.   °WHAT IS FLUSHING? °Flushing helps keep the port from getting clogged. Follow your health care provider's instructions on how and when to flush the port. Ports are usually flushed with saline solution or a medicine called heparin. The need for flushing will depend on how the port is used.  °· If the port is used for intermittent medicines or blood draws, the port will need to be flushed:   °· After medicines have been given.   °· After blood has been drawn.   °· As part of routine maintenance.   °· If a constant infusion is running, the port may not need to be flushed.   °HOW LONG WILL MY PORT STAY IMPLANTED? °The port can stay in for as long as your health care provider thinks it is needed. When it is time for the port to come out, surgery will be done to remove it. The procedure is similar to the one performed when the port was put in.  °WHEN SHOULD I SEEK IMMEDIATE MEDICAL CARE? °When you have an implanted port, you should seek immediate medical care if:  °· You notice a bad smell coming from the incision site.   °· You have swelling, redness, or drainage at the incision site.   °· You have more swelling or pain at the port site or the surrounding area.   °· You have a fever that is not controlled with medicine. °Document  Released: 01/26/2005 Document Revised: 11/16/2012 Document Reviewed: 10/03/2012 °ExitCare® Patient Information ©2015 [employer]. This information is not intended to replace advice given to you by your health care provider. Make sure you discuss any questions you have with your health care provider. °Conscious Sedation °Sedation is the use of medicines to promote relaxation and relieve discomfort and anxiety. Conscious sedation is a type of sedation. Under conscious sedation you are less alert than normal but are still able to respond to instructions or stimulation. Conscious sedation is used during short medical and dental procedures. It is milder than deep sedation or general anesthesia and allows you to return to your regular activities sooner.  °LET YOUR HEALTH CARE PROVIDER   KNOW ABOUT:  °· Any allergies you have. °· All medicines you are taking, including vitamins, herbs, eye drops, creams, and over-the-counter medicines. °· Use of steroids (by mouth or creams). °· Previous problems you or members of your family have had with the use of anesthetics. °· Any blood disorders you have. °· Previous surgeries you have had. °· Medical conditions you have. °· Possibility of pregnancy, if this applies. °· Use of cigarettes, alcohol, or illegal drugs. °RISKS AND COMPLICATIONS °Generally, this is a safe procedure. However, as with any procedure, problems can occur. Possible problems include: °· Oversedation. °· Trouble breathing on your own. You may need to have a breathing tube until you are awake and breathing on your own. °· Allergic reaction to any of the medicines used for the procedure. °BEFORE THE PROCEDURE °· You may have blood tests done. These tests can help show how well your kidneys and liver are working. They can also show how well your blood clots. °· A physical exam will be done.   °· Only take medicines as directed by your health care provider. You may need to stop taking medicines (such as blood  thinners, aspirin, or nonsteroidal anti-inflammatory drugs) before the procedure.   °· Do not eat or drink at least 6 hours before the procedure or as directed by your health care provider. °· Arrange for a responsible adult, family member, or friend to take you home after the procedure. He or she should stay with you for at least 24 hours after the procedure, until the medicine has worn off. °PROCEDURE  °· An intravenous (IV) catheter will be inserted into one of your veins. Medicine will be able to flow directly into your body through this catheter. You may be given medicine through this tube to help prevent pain and help you relax. °· The medical or dental procedure will be done. °AFTER THE PROCEDURE °· You will stay in a recovery area until the medicine has worn off. Your blood pressure and pulse will be checked.   °·  Depending on the procedure you had, you may be allowed to go home when you can tolerate liquids and your pain is under control. °Document Released: 10/21/2000 Document Revised: 01/31/2013 Document Reviewed: 10/03/2012 °ExitCare® Patient Information ©2015 [employer]. This information is not intended to replace advice given to you by your health care provider. Make sure you discuss any questions you have with your health care provider. °Conscious Sedation, Adult, Care After °Refer to this sheet in the next few weeks. These instructions provide you with information on caring for yourself after your procedure. Your health care provider may also give you more specific instructions. Your treatment has been planned according to current medical practices, but problems sometimes occur. Call your health care provider if you have any problems or questions after your procedure. °WHAT TO EXPECT AFTER THE PROCEDURE  °After your procedure: °· You may feel sleepy, clumsy, and have poor balance for several hours. °· Vomiting may occur if you eat too soon after the procedure. °HOME CARE INSTRUCTIONS °· Do not  participate in any activities where you could become injured for at least 24 hours. Do not: °¨ Drive. °¨ Swim. °¨ Ride a bicycle. °¨ Operate heavy machinery. °¨ Cook. °¨ Use power tools. °¨ Climb ladders. °¨ Work from a high place. °· Do not make important decisions or sign legal documents until you are improved. °· If you vomit, drink water, juice, or soup when you can drink without vomiting. Make sure you have M[place]   or no nausea before eating solid foods. °· Only take over-the-counter or prescription medicines for pain, discomfort, or fever as directed by your health care provider. °· Make sure you and your family fully understand everything about the medicines given to you, including what side effects may occur. °· You should not drink alcohol, take sleeping pills, or take medicines that cause drowsiness for at least 24 hours. °· If you smoke, do not smoke without supervision. °· If you are feeling better, you may resume normal activities 24 hours after you were sedated. °· Keep all appointments with your health care provider. °SEEK MEDICAL CARE IF: °· Your skin is pale or bluish in color. °· You continue to feel nauseous or vomit. °· Your pain is getting worse and is not helped by medicine. °· You have bleeding or swelling. °· You are still sleepy or feeling clumsy after 24 hours. °SEEK IMMEDIATE MEDICAL CARE IF: °· You develop a rash. °· You have difficulty breathing. °· You develop any type of allergic problem. °· You have a fever. °MAKE SURE YOU: °· Understand these instructions. °· Will watch your condition. °· Will get help right away if you are not doing well or get worse. °Document Released: 11/16/2012 Document Reviewed: 11/16/2012 °ExitCare® Patient Information ©2015 [employer]. This information is not intended to replace advice given to you by your health care provider. Make sure you discuss any questions you have with your health care provider. ° °

## 2014-07-13 NOTE — Procedures (Signed)
Interventional Radiology Procedure Note  Procedure: Placement of a right IJ approach single lumen PowerPort.  Tip is positioned at the superior cavoatrial junction and catheter is ready for immediate use.  Complications: No immediate Recommendations:  - Ok to shower tomorrow - Routine line care   Ravneet Spilker T. Thadd Apuzzo, M.D Pager:  [phone]   

## 2014-07-16 ENCOUNTER — Other Ambulatory Visit (HOSPITAL_BASED_OUTPATIENT_CLINIC_OR_DEPARTMENT_OTHER): Payer: Medicare Other

## 2014-07-16 DIAGNOSIS — D500 Iron deficiency anemia secondary to blood loss (chronic): Secondary | ICD-10-CM | POA: Diagnosis not present

## 2014-07-16 DIAGNOSIS — K31819 Angiodysplasia of stomach and duodenum without bleeding: Secondary | ICD-10-CM

## 2014-07-16 DIAGNOSIS — I780 Hereditary hemorrhagic telangiectasia: Secondary | ICD-10-CM

## 2014-07-16 LAB — CBC WITH DIFFERENTIAL/PLATELET
BASO%: 0.9 % (ref 0.0–2.0)
BASOS ABS: 0 10*3/uL (ref 0.0–0.1)
EOS%: 5.8 % (ref 0.0–7.0)
Eosinophils Absolute: 0.3 10*3/uL (ref 0.0–0.5)
HCT: 25.2 % — ABNORMAL LOW (ref 34.8–46.6)
HGB: 8 g/dL — ABNORMAL LOW (ref 11.6–15.9)
LYMPH%: 10.3 % — ABNORMAL LOW (ref 14.0–49.7)
MCH: 27.2 pg (ref 25.1–34.0)
MCHC: 31.7 g/dL (ref 31.5–36.0)
MCV: 85.8 fL (ref 79.5–101.0)
MONO#: 0.5 10*3/uL (ref 0.1–0.9)
MONO%: 9 % (ref 0.0–14.0)
NEUT#: 4.1 10*3/uL (ref 1.5–6.5)
NEUT%: 74 % (ref 38.4–76.8)
PLATELETS: 231 10*3/uL (ref 145–400)
RBC: 2.94 10*6/uL — ABNORMAL LOW (ref 3.70–5.45)
RDW: 20 % — AB (ref 11.2–14.5)
WBC: 5.5 10*3/uL (ref 3.9–10.3)
lymph#: 0.6 10*3/uL — ABNORMAL LOW (ref 0.9–3.3)

## 2014-07-16 LAB — FERRITIN CHCC: Ferritin: 30 ng/mL (ref 9–269)

## 2014-07-16 LAB — HOLD TUBE, BLOOD BANK

## 2014-07-25 ENCOUNTER — Telehealth: Payer: Self-pay | Admitting: Oncology

## 2014-07-25 ENCOUNTER — Telehealth: Payer: Self-pay | Admitting: *Deleted

## 2014-07-25 ENCOUNTER — Other Ambulatory Visit: Payer: Self-pay | Admitting: *Deleted

## 2014-07-25 DIAGNOSIS — D649 Anemia, unspecified: Secondary | ICD-10-CM

## 2014-07-25 NOTE — Telephone Encounter (Signed)
Called and left a message with lab apponitment on 6/16

## 2014-07-25 NOTE — Telephone Encounter (Signed)
Yes - please move up lab and get a tube for type & screen. Let's look at the schedule - she will likely need blood Thanks  DrG

## 2014-07-25 NOTE — Telephone Encounter (Signed)
Patient would like to change lab appointment schedule to 07/26/14. Patient states that she is having eye greyness with no complaints of shortness of breath or chest pain. Would you like to "hold tube" for patient? Please advise. Message sent to MD Granfortuna.

## 2014-07-26 ENCOUNTER — Ambulatory Visit (HOSPITAL_COMMUNITY)
Admission: RE | Admit: 2014-07-26 | Discharge: 2014-07-26 | Disposition: A | Discharge status: Home / Self Care | Payer: Medicare Other | Source: Ambulatory Visit | Attending: Oncology | Admitting: Oncology

## 2014-07-26 ENCOUNTER — Ambulatory Visit (HOSPITAL_BASED_OUTPATIENT_CLINIC_OR_DEPARTMENT_OTHER): Payer: Medicare Other

## 2014-07-26 ENCOUNTER — Other Ambulatory Visit (HOSPITAL_BASED_OUTPATIENT_CLINIC_OR_DEPARTMENT_OTHER): Payer: Medicare Other

## 2014-07-26 VITALS — BP 157/53 | HR 60 | Temp 98.4°F | Resp 18

## 2014-07-26 DIAGNOSIS — D6489 Other specified anemias: Secondary | ICD-10-CM | POA: Diagnosis not present

## 2014-07-26 DIAGNOSIS — K31811 Angiodysplasia of stomach and duodenum with bleeding: Secondary | ICD-10-CM | POA: Diagnosis not present

## 2014-07-26 DIAGNOSIS — D500 Iron deficiency anemia secondary to blood loss (chronic): Secondary | ICD-10-CM | POA: Diagnosis not present

## 2014-07-26 DIAGNOSIS — D649 Anemia, unspecified: Secondary | ICD-10-CM

## 2014-07-26 LAB — CBC WITH DIFFERENTIAL/PLATELET
BASO%: 0.6 % (ref 0.0–2.0)
BASOS ABS: 0 10*3/uL (ref 0.0–0.1)
EOS%: 5.3 % (ref 0.0–7.0)
Eosinophils Absolute: 0.3 10*3/uL (ref 0.0–0.5)
HCT: 20.6 % — ABNORMAL LOW (ref 34.8–46.6)
HGB: 6.5 g/dL — CL (ref 11.6–15.9)
LYMPH%: 10 % — ABNORMAL LOW (ref 14.0–49.7)
MCH: 25.6 pg (ref 25.1–34.0)
MCHC: 31.4 g/dL — ABNORMAL LOW (ref 31.5–36.0)
MCV: 81.6 fL (ref 79.5–101.0)
MONO#: 0.6 10*3/uL (ref 0.1–0.9)
MONO%: 11 % (ref 0.0–14.0)
NEUT#: 4.2 10*3/uL (ref 1.5–6.5)
NEUT%: 73.1 % (ref 38.4–76.8)
PLATELETS: 189 10*3/uL (ref 145–400)
RBC: 2.52 10*6/uL — AB (ref 3.70–5.45)
RDW: 20.1 % — AB (ref 11.2–14.5)
WBC: 5.8 10*3/uL (ref 3.9–10.3)
lymph#: 0.6 10*3/uL — ABNORMAL LOW (ref 0.9–3.3)

## 2014-07-26 LAB — HOLD TUBE, BLOOD BANK

## 2014-07-26 LAB — PREPARE RBC (CROSSMATCH)

## 2014-07-26 MED ORDER — HEPARIN SOD (PORK) LOCK FLUSH 100 UNIT/ML IV SOLN
500.0000 [IU] | Freq: Every day | INTRAVENOUS | Status: AC | PRN
  Administered 2014-07-26: 500 [IU]
  Filled 2014-07-26: qty 5

## 2014-07-26 MED ORDER — SODIUM CHLORIDE 0.9 % IJ SOLN
10.0000 mL | INTRAMUSCULAR | Status: AC | PRN
  Administered 2014-07-26: 10 mL
  Filled 2014-07-26: qty 10

## 2014-07-26 MED ORDER — SODIUM CHLORIDE 0.9 % IV SOLN
250.0000 mL | Freq: Once | INTRAVENOUS | Status: AC
  Administered 2014-07-26: 250 mL via INTRAVENOUS

## 2014-07-26 NOTE — Patient Instructions (Signed)

## 2014-07-27 ENCOUNTER — Encounter: Payer: Self-pay | Admitting: Cardiology

## 2014-07-27 ENCOUNTER — Ambulatory Visit (INDEPENDENT_AMBULATORY_CARE_PROVIDER_SITE_OTHER): Payer: Medicare Other | Admitting: Cardiology

## 2014-07-27 VITALS — BP 162/60 | HR 74 | Ht 61.0 in | Wt 154.4 lb

## 2014-07-27 DIAGNOSIS — R0602 Shortness of breath: Secondary | ICD-10-CM

## 2014-07-27 DIAGNOSIS — I5032 Chronic diastolic (congestive) heart failure: Secondary | ICD-10-CM | POA: Diagnosis not present

## 2014-07-27 DIAGNOSIS — I10 Essential (primary) hypertension: Secondary | ICD-10-CM | POA: Diagnosis not present

## 2014-07-27 LAB — TYPE AND SCREEN
ABO/RH(D): A POS
ANTIBODY SCREEN: NEGATIVE
UNIT DIVISION: 0
Unit division: 0

## 2014-07-27 LAB — BASIC METABOLIC PANEL
BUN: 14 mg/dL (ref 6–23)
CALCIUM: 8.9 mg/dL (ref 8.4–10.5)
CO2: 27 meq/L (ref 19–32)
CREATININE: 0.9 mg/dL (ref 0.40–1.20)
Chloride: 104 mEq/L (ref 96–112)
GFR: 78.64 mL/min (ref >60.00)
GLUCOSE: 356 mg/dL — AB (ref 70–99)
Potassium: 3.6 mEq/L (ref 3.5–5.1)
Sodium: 138 mEq/L (ref 135–145)

## 2014-07-27 LAB — BRAIN NATRIURETIC PEPTIDE: PRO B NATRI PEPTIDE: 866 pg/mL — AB (ref 0.0–100.0)

## 2014-07-27 MED ORDER — METOPROLOL SUCCINATE ER 25 MG PO TB24: 25.0000 mg | ORAL_TABLET | Freq: Every day | ORAL | Status: DC

## 2014-07-27 NOTE — Patient Instructions (Signed)
Medication Instructions:  Your physician has recommended you make the following change in your medication:  1) START TOPROL XL 25 mg daily  Labwork: TODAY: BMET, BNP  Testing/Procedures: None  Follow-Up: Your physician recommends that you schedule a follow-up appointment in 2 weeks with Dr. Radford Pax. I will call you to schedule this appointment.  Any Other Special Instructions Will Be Listed Below (If Applicable).

## 2014-07-27 NOTE — Progress Notes (Signed)
Cardiology Office Note   Date:  07/28/2014   ID:  [name], DOB [birth year], MRN [id number]  PCP:  Philis Fendt, MD    Chief Complaint  Patient presents with  . Shortness of Breath  . Congestive Heart Failure      History of Present Illness: [name] is a 74 y.o. female who presents for evaluation of grade II diastolic dysfunction by recent echo in hospital. She has a history of Osler-weber-rendu disase, gastric AVMs and chronic iron deficiency anemia requiring transfusion.  She also has a history of HTN, dyslipidemia, type II DM and GERD.    She says that occasionally she will have some SOB.  This usually occurs when she is doing housework.  She denies any chest pain.  She occasionally has some LE edema mainly in the left ankle and resolves when she elevates her legs.  She denies any palpitations, dizziness or syncope.  She has a history of CHF in the past.  She was recently admitted to [facility] ER with a GI bleed and a 2 D echo was done showing mild LVF with severe ASSH and normal LVF and G2DD with severely enlarged LA.  She is referred now for further evaluation.    Past Medical History  Diagnosis Date  . Anemia   . S/P endoscopy     with laser treatment  08/19/2006  . History of epistaxis   . Hyperlipidemia   . Telangiectasia     Gastric   . Lichen planus     Both lower extremities  . GERD (gastroesophageal reflux disease)   . Heart murmur   . CHF (congestive heart failure)   . Pneumonia 1990's    "twice" (09/15/2012)  . Type II diabetes mellitus     insulin requiring.  Marland Kitchen History of blood transfusion     "getting 2 today; have had transfusions before" (09/15/2012)  . Family history of anesthesia complication     "niece has a hard time coming out" (09/15/2012)  . Chronic diastolic CHF (congestive heart failure) 10/03/2013  . HTN (hypertension), benign 03/02/2012    Past Surgical History  Procedure Laterality Date  . Nose surgery      "for  bleeding" (09/15/2012)  . Savory dilation  02/26/2011    Procedure: SAVORY DILATION;  Surgeon: [name], MD;  Location: [name];  Service: Endoscopy;  Laterality: N/A;  c-arm needed  . Esophagogastroduodenoscopy  02/26/2011    Procedure: ESOPHAGOGASTRODUODENOSCOPY (EGD);  Surgeon: [name], MD;  Location: [name];  Service: Endoscopy;  Laterality: N/A;  . Esophagogastroduodenoscopy N/A 11/08/2012    Procedure: ESOPHAGOGASTRODUODENOSCOPY (EGD);  Surgeon: [name], MD;  Location: [name];  Service: Endoscopy;  Laterality: N/A;  . Cataract surgery    . Esophagogastroduodenoscopy N/A 10/04/2013    Procedure: ESOPHAGOGASTRODUODENOSCOPY (EGD);  Surgeon: Winfield Cunas., MD;  Location: [name];  Service: Endoscopy;  Laterality: N/A;  with APC on stand-by  . Esophagogastroduodenoscopy N/A 07/06/2014    Procedure: ESOPHAGOGASTRODUODENOSCOPY (EGD);  Surgeon: Clarene Essex, MD;  Location: [name];  Service: Endoscopy;  Laterality: N/A;  . Hot hemostasis N/A 07/06/2014    Procedure: HOT HEMOSTASIS (ARGON PLASMA COAGULATION/BICAP);  Surgeon: Clarene Essex, MD;  Location: [name];  Service: Endoscopy;  Laterality: N/A;     Current Outpatient Prescriptions  Medication Sig Dispense Refill  . amitriptyline (ELAVIL) 25 MG tablet Take 1 tablet  by mouth at bedtime as needed for sleep.     . enalapril (VASOTEC) 20 MG tablet Take 20 mg by mouth every morning.     . insulin detemir (LEVEMIR) 100 UNIT/ML injection Inject 0.3 mLs (30 Units total) into the skin at bedtime. (Patient taking differently: Inject 30-40 Units into the skin 2 (two) times daily. 40 units in the Morning, and 30 units at Bedtime) 10 mL 11  . oxymetazoline (AFRIN) 0.05 % nasal spray Place 1 spray into both nostrils 2 (two) times daily as needed (Epistaxis). 30 mL 0  . pantoprazole (PROTONIX) 40 MG tablet Take 1 tablet (40 mg total) by mouth 2 (two) times daily. 60 tablet 1  . vitamin B-12 (CYANOCOBALAMIN) 1000  MCG tablet Take 1,000 mcg by mouth at bedtime.     . Vitamin D, Ergocalciferol, (DRISDOL) 50000 UNITS CAPS Take 50,000 Units by mouth every 7 (seven) days. Takes on Monday    . metoprolol succinate (TOPROL XL) 25 MG 24 hr tablet Take 1 tablet (25 mg total) by mouth daily. 30 tablet 6   No current facility-administered medications for this visit.    Allergies:   Aspirin    Social History:  The patient  reports that she quit smoking about 43 years ago. Her smoking use included Cigarettes. She quit after 2 years of use. She has never used smokeless tobacco. She reports that she does not drink alcohol or use illicit drugs.   Family History:  The patient's family history includes Breast cancer in an other family member; Stroke in her father. There is no history of Malignant hyperthermia.    ROS:  Please see the history of present illness.   Otherwise, review of systems are positive for none.   All other systems are reviewed and negative.    PHYSICAL EXAM: VS:  BP 162/60 mmHg  Pulse 74  Ht 5\' 1"  (1.549 m)  Wt 154 lb 6.4 oz (70.035 kg)  BMI 29.19 kg/m2  SpO2 95% , BMI Body mass index is 29.19 kg/(m^2). GEN: Well nourished, well developed, in no acute distress HEENT: normal Neck: no JVD, carotid bruits, or masses Cardiac: RRR; no rubs, or gallops,no edema.  2/6 SM at RUSB to LLSB Respiratory:  clear to auscultation bilaterally, normal work of breathing GI: soft, nontender, nondistended, + BS MS: no deformity or atrophy Skin: warm and dry, no rash Neuro:  Strength and sensation are intact Psych: euthymic mood, full affect   EKG:  EKG is not ordered today.    Recent Labs: 07/05/2014: ALT 19 07/26/2014: HGB 6.5*; Platelets 189 07/27/2014: BUN 14; Creatinine, Ser 0.90; Potassium 3.6; Pro B Natriuretic peptide (BNP) 866.0*; Sodium 138    Lipid Panel    Component Value Date/Time   CHOL  01/19/2009 0630    191        ATP III CLASSIFICATION:  <200     mg/dL   Desirable  200-239   mg/dL   Borderline High  >=240    mg/dL   High          TRIG 184* 01/19/2009 0630   HDL 45 01/19/2009 0630   CHOLHDL 4.2 01/19/2009 0630   VLDL 37 01/19/2009 0630   LDLCALC * 01/19/2009 0630    109        Total Cholesterol/HDL:CHD Risk Coronary Heart Disease Risk Table                     Men   Women  1/2 Average  Risk   3.4   3.3  Average Risk       5.0   4.4  2 X Average Risk   9.6   7.1  3 X Average Risk  23.4   11.0        Use the calculated Patient Ratio above and the CHD Risk Table to determine the patient's CHD Risk.        ATP III CLASSIFICATION (LDL):  <100     mg/dL   Optimal  100-129  mg/dL   Near or Above                    Optimal  130-159  mg/dL   Borderline  160-189  mg/dL   High  >190     mg/dL   Very High      Wt Readings from Last 3 Encounters:  07/27/14 154 lb 6.4 oz (70.035 kg)  07/06/14 158 lb 1.1 oz (71.7 kg)  06/18/14 154 lb 11.2 oz (70.171 kg)        ASSESSMENT AND PLAN:  1.  Chronic diastolic CHF with severe asymmetric septal hypertrophy with grade 2 DD and normal LVF.  She does have some DOE but otherwise is fairly asymptomatic.  She says that the only time she had CHF was when she was admitted once and given IVF and got too many fluids.  She occasionally will have some mild LLE ankle edema.  Check BNP today.  I will start Toprol XL to slow heart rate and allow for longer diastolic filling time as well as help with better BP control.  I will see her back in 2 weeks to reevaluate.   2.  HTN - BP elevated today.  Continue Vasotec.  I will start her on Toprol XL 25mg  daily which will help with her diastolic dysfunction as well.   3.  SOB most likely secondary to poorly controlled HTN in setting of grade 2 DD.  If this does not improve with control of HTN and treatment of diastolic dysfunction then may need to consider noninvasive ischemia workup.   Current medicines are reviewed at length with the patient today.  The patient does not have concerns  regarding medicines.  The following changes have been made:  Add Toprol XL 25mg  daily  Labs/ tests ordered today: See above Assessment and Plan  Orders Placed This Encounter  Procedures  . Basic Metabolic Panel (BMET)  . B Nat Peptide     Disposition:   FU with me in 2 weeks  Signed, [name], MD  07/28/2014 6:30 PM    [address], [place], [place]  [address] Phone: [phone]; Fax: [phone]

## 2014-07-28 ENCOUNTER — Encounter: Payer: Self-pay | Admitting: Cardiology

## 2014-07-30 ENCOUNTER — Other Ambulatory Visit: Payer: Medicare Other

## 2014-07-30 ENCOUNTER — Other Ambulatory Visit: Payer: Self-pay | Admitting: *Deleted

## 2014-07-30 DIAGNOSIS — R7989 Other specified abnormal findings of blood chemistry: Secondary | ICD-10-CM

## 2014-07-30 MED ORDER — FUROSEMIDE 40 MG PO TABS: ORAL_TABLET | ORAL | Status: DC

## 2014-07-31 ENCOUNTER — Telehealth: Payer: Self-pay

## 2014-07-31 NOTE — Telephone Encounter (Signed)
Called patient to confirm 7/1 OV with Dr. Radford Pax at 0930.  Left message to call back.

## 2014-08-01 NOTE — Telephone Encounter (Signed)
Confirmed appointment time and date with patient.

## 2014-08-06 ENCOUNTER — Ambulatory Visit: Payer: Medicare Other | Admitting: Internal Medicine

## 2014-08-06 ENCOUNTER — Other Ambulatory Visit: Payer: Self-pay

## 2014-08-06 ENCOUNTER — Other Ambulatory Visit: Payer: Medicare Other

## 2014-08-06 DIAGNOSIS — K920 Hematemesis: Secondary | ICD-10-CM | POA: Diagnosis not present

## 2014-08-06 DIAGNOSIS — D62 Acute posthemorrhagic anemia: Secondary | ICD-10-CM | POA: Diagnosis not present

## 2014-08-09 NOTE — Progress Notes (Signed)
Cardiology Office Note   Date:  08/10/2014   ID:  [name], DOB Dec 20, 1940, MRN [id number]  PCP:  Philis Fendt, MD    Chief Complaint  Patient presents with  . Congestive Heart Failure      History of Present Illness: [name] is a 74 y.o. female who presents for followup of grade II diastolic dysfunction with chronic diastolic CHF.  Marland Kitchen She has a history of Osler-weber-rendu disase, gastric AVMs and chronic iron deficiency anemia requiring transfusion. She also has a history of HTN, dyslipidemia, type II DM and GERD.She denies any chest pain. She occasionally has some LE edema mainly in the left ankle and resolves when she elevates her legs. She denies any palpitations, dizziness or syncope. She has a history of CHF in the past. Since starting on Toprol and Lasix her SOB has resolved.     Past Medical History  Diagnosis Date  . Anemia   . S/P endoscopy     with laser treatment  08/19/2006  . History of epistaxis   . Hyperlipidemia   . Telangiectasia     Gastric   . Lichen planus     Both lower extremities  . GERD (gastroesophageal reflux disease)   . Heart murmur   . CHF (congestive heart failure)   . Pneumonia 1990's    "twice" (09/15/2012)  . Type II diabetes mellitus     insulin requiring.  Marland Kitchen History of blood transfusion     "getting 2 today; have had transfusions before" (09/15/2012)  . Family history of anesthesia complication     "niece has a hard time coming out" (09/15/2012)  . Chronic diastolic CHF (congestive heart failure) 10/03/2013  . HTN (hypertension), benign 03/02/2012    Past Surgical History  Procedure Laterality Date  . Nose surgery      "for bleeding" (09/15/2012)  . Savory dilation  02/26/2011    Procedure: SAVORY DILATION;  Surgeon: [name], MD;  Location: [name];  Service: Endoscopy;  Laterality: N/A;  c-arm needed  . Esophagogastroduodenoscopy  02/26/2011    Procedure: ESOPHAGOGASTRODUODENOSCOPY (EGD);   Surgeon: [name], MD;  Location: [name];  Service: Endoscopy;  Laterality: N/A;  . Esophagogastroduodenoscopy N/A 11/08/2012    Procedure: ESOPHAGOGASTRODUODENOSCOPY (EGD);  Surgeon: [name], MD;  Location: [name];  Service: Endoscopy;  Laterality: N/A;  . Cataract surgery    . Esophagogastroduodenoscopy N/A 10/04/2013    Procedure: ESOPHAGOGASTRODUODENOSCOPY (EGD);  Surgeon: Winfield Cunas., MD;  Location: [name];  Service: Endoscopy;  Laterality: N/A;  with APC on stand-by  . Esophagogastroduodenoscopy N/A 07/06/2014    Procedure: ESOPHAGOGASTRODUODENOSCOPY (EGD);  Surgeon: Clarene Essex, MD;  Location: [name];  Service: Endoscopy;  Laterality: N/A;  . Hot hemostasis N/A 07/06/2014    Procedure: HOT HEMOSTASIS (ARGON PLASMA COAGULATION/BICAP);  Surgeon: Clarene Essex, MD;  Location: [name];  Service: Endoscopy;  Laterality: N/A;     Current Outpatient Prescriptions  Medication Sig Dispense Refill  . amitriptyline (ELAVIL) 25 MG tablet Take 1 tablet by mouth at bedtime as needed for sleep.     . enalapril (VASOTEC) 20 MG tablet Take 20 mg by mouth every morning.     . furosemide (LASIX) 40 MG tablet Take 40mg  twice daily for two days and then start taking 40mg  once daily. 90 tablet 3  . insulin detemir (LEVEMIR) 100 UNIT/ML injection Inject 0.3  mLs (30 Units total) into the skin at bedtime. (Patient taking differently: Inject 30-40 Units into the skin 2 (two) times daily. 40 units in the Morning, and 30 units at Bedtime) 10 mL 11  . metoprolol succinate (TOPROL XL) 25 MG 24 hr tablet Take 1 tablet (25 mg total) by mouth daily. 30 tablet 6  . oxymetazoline (AFRIN) 0.05 % nasal spray Place 1 spray into both nostrils 2 (two) times daily as needed (Epistaxis). 30 mL 0  . pantoprazole (PROTONIX) 40 MG tablet Take 1 tablet (40 mg total) by mouth 2 (two) times daily. 60 tablet 1  . vitamin B-12 (CYANOCOBALAMIN) 1000 MCG tablet Take 1,000 mcg by mouth at bedtime.      . Vitamin D, Ergocalciferol, (DRISDOL) 50000 UNITS CAPS Take 50,000 Units by mouth every 7 (seven) days. Takes on Monday     No current facility-administered medications for this visit.    Allergies:   Aspirin    Social History:  The patient  reports that she quit smoking about 43 years ago. Her smoking use included Cigarettes. She quit after 2 years of use. She has never used smokeless tobacco. She reports that she does not drink alcohol or use illicit drugs.   Family History:  The patient's family history includes Breast cancer in an other family member; Stroke in her father. There is no history of Malignant hyperthermia.    ROS:  Please see the history of present illness.   Otherwise, review of systems are positive for none.   All other systems are reviewed and negative.    PHYSICAL EXAM: VS:  BP 160/72 mmHg  Pulse 50  Ht 5\' 1"  (1.549 m)  Wt 152 lb (68.947 kg)  BMI 28.74 kg/m2 , BMI Body mass index is 28.74 kg/(m^2). GEN: Well nourished, well developed, in no acute distress HEENT: normal Neck: no JVD, carotid bruits, or masses Cardiac: RRR; no murmurs, rubs, or gallops,no edema  Respiratory:  clear to auscultation bilaterally, normal work of breathing GI: soft, nontender, nondistended, + BS MS: no deformity or atrophy Skin: warm and dry, no rash Neuro:  Strength and sensation are intact Psych: euthymic mood, full affect   EKG:  EKG was ordered today and showed sinus bradycardia with sinus arrhythmia and nonspecific ST abnormality    Recent Labs: 07/05/2014: ALT 19 07/26/2014: HGB 6.5*; Platelets 189 07/27/2014: BUN 14; Creatinine, Ser 0.90; Potassium 3.6; Pro B Natriuretic peptide (BNP) 866.0*; Sodium 138    Lipid Panel    Component Value Date/Time   CHOL  01/19/2009 0630    191        ATP III CLASSIFICATION:  <200     mg/dL   Desirable  200-239  mg/dL   Borderline High  >=240    mg/dL   High          TRIG 184* 01/19/2009 0630   HDL 45 01/19/2009 0630    CHOLHDL 4.2 01/19/2009 0630   VLDL 37 01/19/2009 0630   LDLCALC * 01/19/2009 0630    109        Total Cholesterol/HDL:CHD Risk Coronary Heart Disease Risk Table                     Men   Women  1/2 Average Risk   3.4   3.3  Average Risk       5.0   4.4  2 X Average Risk   9.6   7.1  3 X Average Risk  23.4  11.0        Use the calculated Patient Ratio above and the CHD Risk Table to determine the patient's CHD Risk.        ATP III CLASSIFICATION (LDL):  <100     mg/dL   Optimal  100-129  mg/dL   Near or Above                    Optimal  130-159  mg/dL   Borderline  160-189  mg/dL   High  >190     mg/dL   Very High      Wt Readings from Last 3 Encounters:  08/10/14 152 lb (68.947 kg)  07/27/14 154 lb 6.4 oz (70.035 kg)  07/06/14 158 lb 1.1 oz (71.7 kg)    ASSESSMENT AND PLAN:  1. Chronic diastolic CHF with severe asymmetric septal hypertrophy with grade 2 DD and normal LVF. She does have some DOE but otherwise is fairly asymptomatic. She says that the only time she had CHF was when she was admitted once and given IVF and got too many fluids. She occasionally will have some mild LLE ankle edema.Recent BNP was elevated and she was started on Lasix.Continue Lasix and Toprol.   2. HTN - BP borderline controlled today. Continue Vasotec/BB.Add amlodipne 5mg  daily.  BP check in BP clinic in 1 week 3. SOB most likely secondary to poorly controlled HTN in setting of grade 2 DD. This has resolved on BB and diuretic.      Current medicines are reviewed at length with the patient today.  The patient does not have concerns regarding medicines.  The following changes have been made:  no change  Labs/ tests ordered today: See above Assessment and Plan No orders of the defined types were placed in this encounter.     Disposition:   FU with me in 6 months  Signed, [name], MD  08/10/2014 8:34 AM    [facility] [place], [place],  [place]  [address] Phone: [phone]; Fax: [phone]

## 2014-08-10 ENCOUNTER — Ambulatory Visit: Payer: Medicare Other | Admitting: Cardiology

## 2014-08-10 ENCOUNTER — Ambulatory Visit (INDEPENDENT_AMBULATORY_CARE_PROVIDER_SITE_OTHER): Payer: Medicare Other | Admitting: Cardiology

## 2014-08-10 ENCOUNTER — Encounter: Payer: Self-pay | Admitting: Cardiology

## 2014-08-10 VITALS — BP 160/72 | HR 50 | Ht 61.0 in | Wt 152.0 lb

## 2014-08-10 DIAGNOSIS — I10 Essential (primary) hypertension: Secondary | ICD-10-CM | POA: Diagnosis not present

## 2014-08-10 DIAGNOSIS — R0600 Dyspnea, unspecified: Secondary | ICD-10-CM

## 2014-08-10 DIAGNOSIS — R0602 Shortness of breath: Secondary | ICD-10-CM | POA: Diagnosis not present

## 2014-08-10 DIAGNOSIS — I5032 Chronic diastolic (congestive) heart failure: Secondary | ICD-10-CM

## 2014-08-10 MED ORDER — AMLODIPINE BESYLATE 5 MG PO TABS: 5.0000 mg | ORAL_TABLET | Freq: Every day | ORAL | Status: DC

## 2014-08-10 NOTE — Patient Instructions (Signed)
Medication Instructions:  Your physician has recommended you make the following change in your medication:  1) START Amlodipine 5 mg tablet by mouth ONCE daily  Labwork: NONE  Testing/Procedures: NONE  Follow-Up: Your physician recommends that you schedule a follow-up appointment in: [facility] in 1 week  Your physician wants you to follow-up in: 6 months with Dr. Radford Pax. You will receive a reminder letter in the mail two months in advance. If you don't receive a letter, please call our office to schedule the follow-up appointment.  Any Other Special Instructions Will Be Listed Below (If Applicable).

## 2014-08-16 NOTE — Telephone Encounter (Signed)
err

## 2014-08-27 ENCOUNTER — Emergency Department (HOSPITAL_COMMUNITY): Payer: Medicare Other

## 2014-08-27 ENCOUNTER — Other Ambulatory Visit: Payer: Medicare Other

## 2014-08-27 ENCOUNTER — Encounter (HOSPITAL_COMMUNITY): Payer: Self-pay | Admitting: Emergency Medicine

## 2014-08-27 ENCOUNTER — Inpatient Hospital Stay (HOSPITAL_COMMUNITY)
Admission: EM | Admit: 2014-08-27 | Discharge: 2014-08-29 | DRG: 689 | Disposition: A | Discharge status: Home / Self Care | Payer: Medicare Other | Attending: Internal Medicine | Admitting: Internal Medicine

## 2014-08-27 DIAGNOSIS — Q2733 Arteriovenous malformation of digestive system vessel: Secondary | ICD-10-CM

## 2014-08-27 DIAGNOSIS — Z803 Family history of malignant neoplasm of breast: Secondary | ICD-10-CM

## 2014-08-27 DIAGNOSIS — N183 Chronic kidney disease, stage 3 unspecified: Secondary | ICD-10-CM | POA: Diagnosis present

## 2014-08-27 DIAGNOSIS — D631 Anemia in chronic kidney disease: Secondary | ICD-10-CM | POA: Diagnosis not present

## 2014-08-27 DIAGNOSIS — K219 Gastro-esophageal reflux disease without esophagitis: Secondary | ICD-10-CM | POA: Diagnosis not present

## 2014-08-27 DIAGNOSIS — G9389 Other specified disorders of brain: Secondary | ICD-10-CM | POA: Diagnosis not present

## 2014-08-27 DIAGNOSIS — G9340 Encephalopathy, unspecified: Secondary | ICD-10-CM | POA: Diagnosis not present

## 2014-08-27 DIAGNOSIS — R000 Tachycardia, unspecified: Secondary | ICD-10-CM | POA: Diagnosis not present

## 2014-08-27 DIAGNOSIS — I780 Hereditary hemorrhagic telangiectasia: Secondary | ICD-10-CM | POA: Diagnosis present

## 2014-08-27 DIAGNOSIS — Z823 Family history of stroke: Secondary | ICD-10-CM | POA: Diagnosis not present

## 2014-08-27 DIAGNOSIS — Z794 Long term (current) use of insulin: Secondary | ICD-10-CM | POA: Diagnosis not present

## 2014-08-27 DIAGNOSIS — D509 Iron deficiency anemia, unspecified: Secondary | ICD-10-CM | POA: Diagnosis present

## 2014-08-27 DIAGNOSIS — E785 Hyperlipidemia, unspecified: Secondary | ICD-10-CM | POA: Diagnosis not present

## 2014-08-27 DIAGNOSIS — R4182 Altered mental status, unspecified: Secondary | ICD-10-CM | POA: Diagnosis not present

## 2014-08-27 DIAGNOSIS — Z9889 Other specified postprocedural states: Secondary | ICD-10-CM | POA: Diagnosis not present

## 2014-08-27 DIAGNOSIS — I5032 Chronic diastolic (congestive) heart failure: Secondary | ICD-10-CM | POA: Diagnosis present

## 2014-08-27 DIAGNOSIS — N390 Urinary tract infection, site not specified: Secondary | ICD-10-CM | POA: Diagnosis not present

## 2014-08-27 DIAGNOSIS — R410 Disorientation, unspecified: Secondary | ICD-10-CM | POA: Diagnosis not present

## 2014-08-27 DIAGNOSIS — Z79899 Other long term (current) drug therapy: Secondary | ICD-10-CM

## 2014-08-27 DIAGNOSIS — Z87891 Personal history of nicotine dependence: Secondary | ICD-10-CM

## 2014-08-27 DIAGNOSIS — Z8701 Personal history of pneumonia (recurrent): Secondary | ICD-10-CM | POA: Diagnosis not present

## 2014-08-27 DIAGNOSIS — E119 Type 2 diabetes mellitus without complications: Secondary | ICD-10-CM | POA: Diagnosis not present

## 2014-08-27 DIAGNOSIS — N179 Acute kidney failure, unspecified: Secondary | ICD-10-CM | POA: Diagnosis not present

## 2014-08-27 DIAGNOSIS — Z888 Allergy status to other drugs, medicaments and biological substances status: Secondary | ICD-10-CM

## 2014-08-27 DIAGNOSIS — R531 Weakness: Secondary | ICD-10-CM | POA: Diagnosis not present

## 2014-08-27 DIAGNOSIS — D638 Anemia in other chronic diseases classified elsewhere: Secondary | ICD-10-CM | POA: Diagnosis present

## 2014-08-27 DIAGNOSIS — I129 Hypertensive chronic kidney disease with stage 1 through stage 4 chronic kidney disease, or unspecified chronic kidney disease: Secondary | ICD-10-CM | POA: Diagnosis not present

## 2014-08-27 LAB — URINALYSIS, ROUTINE W REFLEX MICROSCOPIC
BILIRUBIN URINE: NEGATIVE
Glucose, UA: NEGATIVE mg/dL
KETONES UR: NEGATIVE mg/dL
NITRITE: NEGATIVE
Protein, ur: NEGATIVE mg/dL
SPECIFIC GRAVITY, URINE: 1.023 (ref 1.005–1.030)
UROBILINOGEN UA: 0.2 mg/dL (ref 0.0–1.0)
pH: 5 (ref 5.0–8.0)

## 2014-08-27 LAB — CBC
HEMATOCRIT: 31.7 % — AB (ref 36.0–46.0)
HEMOGLOBIN: 9.3 g/dL — AB (ref 12.0–15.0)
MCH: 24.5 pg — ABNORMAL LOW (ref 26.0–34.0)
MCHC: 29.3 g/dL — ABNORMAL LOW (ref 30.0–36.0)
MCV: 83.6 fL (ref 78.0–100.0)
PLATELETS: 237 10*3/uL (ref 150–400)
RBC: 3.79 MIL/uL — ABNORMAL LOW (ref 3.87–5.11)
RDW: 20.4 % — ABNORMAL HIGH (ref 11.5–15.5)
WBC: 7.9 10*3/uL (ref 4.0–10.5)

## 2014-08-27 LAB — GLUCOSE, CAPILLARY
Glucose-Capillary: 297 mg/dL — ABNORMAL HIGH (ref 65–99)
Glucose-Capillary: 424 mg/dL — ABNORMAL HIGH (ref 65–99)

## 2014-08-27 LAB — CBC WITH DIFFERENTIAL/PLATELET
Basophils Absolute: 0 10*3/uL (ref 0.0–0.1)
Basophils Relative: 0 % (ref 0–1)
EOS ABS: 0.1 10*3/uL (ref 0.0–0.7)
EOS PCT: 1 % (ref 0–5)
HEMATOCRIT: 31.9 % — AB (ref 36.0–46.0)
Hemoglobin: 9.7 g/dL — ABNORMAL LOW (ref 12.0–15.0)
LYMPHS PCT: 8 % — AB (ref 12–46)
Lymphs Abs: 0.8 10*3/uL (ref 0.7–4.0)
MCH: 25.2 pg — AB (ref 26.0–34.0)
MCHC: 30.4 g/dL (ref 30.0–36.0)
MCV: 82.9 fL (ref 78.0–100.0)
Monocytes Absolute: 1.2 10*3/uL — ABNORMAL HIGH (ref 0.1–1.0)
Monocytes Relative: 12 % (ref 3–12)
Neutro Abs: 8.1 10*3/uL — ABNORMAL HIGH (ref 1.7–7.7)
Neutrophils Relative %: 79 % — ABNORMAL HIGH (ref 43–77)
Platelets: 266 10*3/uL (ref 150–400)
RBC: 3.85 MIL/uL — AB (ref 3.87–5.11)
RDW: 20.4 % — ABNORMAL HIGH (ref 11.5–15.5)
WBC: 10.3 10*3/uL (ref 4.0–10.5)

## 2014-08-27 LAB — DIFFERENTIAL
BASOS ABS: 0.1 10*3/uL (ref 0.0–0.1)
Basophils Relative: 1 % (ref 0–1)
EOS PCT: 2 % (ref 0–5)
Eosinophils Absolute: 0.1 10*3/uL (ref 0.0–0.7)
LYMPHS PCT: 12 % (ref 12–46)
Lymphs Abs: 0.9 10*3/uL (ref 0.7–4.0)
MONOS PCT: 11 % (ref 3–12)
Monocytes Absolute: 0.8 10*3/uL (ref 0.1–1.0)
Neutro Abs: 5.9 10*3/uL (ref 1.7–7.7)
Neutrophils Relative %: 75 % (ref 43–77)

## 2014-08-27 LAB — COMPREHENSIVE METABOLIC PANEL
ALK PHOS: 96 U/L (ref 38–126)
ALT: 19 U/L (ref 14–54)
ALT: 19 U/L (ref 14–54)
ANION GAP: 13 (ref 5–15)
ANION GAP: 12 (ref 5–15)
AST: 28 U/L (ref 15–41)
AST: 26 U/L (ref 15–41)
Albumin: 3.7 g/dL (ref 3.5–5.0)
Albumin: 3.7 g/dL (ref 3.5–5.0)
Alkaline Phosphatase: 90 U/L (ref 38–126)
BUN: 29 mg/dL — AB (ref 6–20)
BUN: 31 mg/dL — ABNORMAL HIGH (ref 6–20)
CHLORIDE: 100 mmol/L — AB (ref 101–111)
CHLORIDE: 102 mmol/L (ref 101–111)
CO2: 25 mmol/L (ref 22–32)
CO2: 27 mmol/L (ref 22–32)
CREATININE: 1.67 mg/dL — AB (ref 0.44–1.00)
Calcium: 9.2 mg/dL (ref 8.9–10.3)
Calcium: 9.4 mg/dL (ref 8.9–10.3)
Creatinine, Ser: 1.67 mg/dL — ABNORMAL HIGH (ref 0.44–1.00)
GFR calc Af Amer: 34 mL/min — ABNORMAL LOW (ref >60)
GFR calc non Af Amer: 29 mL/min — ABNORMAL LOW (ref >60)
GFR, EST AFRICAN AMERICAN: 34 mL/min — AB (ref >60)
GFR, EST NON AFRICAN AMERICAN: 29 mL/min — AB (ref >60)
GLUCOSE: 332 mg/dL — AB (ref 65–99)
Glucose, Bld: 426 mg/dL — ABNORMAL HIGH (ref 65–99)
POTASSIUM: 4.1 mmol/L (ref 3.5–5.1)
POTASSIUM: 3.8 mmol/L (ref 3.5–5.1)
Sodium: 138 mmol/L (ref 135–145)
Sodium: 141 mmol/L (ref 135–145)
Total Bilirubin: 0.9 mg/dL (ref 0.3–1.2)
Total Bilirubin: 0.5 mg/dL (ref 0.3–1.2)
Total Protein: 6.8 g/dL (ref 6.5–8.1)
Total Protein: 7.1 g/dL (ref 6.5–8.1)

## 2014-08-27 LAB — RAPID URINE DRUG SCREEN, HOSP PERFORMED
Amphetamines: NOT DETECTED
Barbiturates: NOT DETECTED
Benzodiazepines: NOT DETECTED
COCAINE: NOT DETECTED
Opiates: NOT DETECTED
Tetrahydrocannabinol: NOT DETECTED

## 2014-08-27 LAB — PHOSPHORUS: Phosphorus: 3.6 mg/dL (ref 2.5–4.6)

## 2014-08-27 LAB — URINE MICROSCOPIC-ADD ON

## 2014-08-27 LAB — TSH: TSH: 1.001 u[IU]/mL (ref 0.350–4.500)

## 2014-08-27 LAB — ETHANOL: Alcohol, Ethyl (B): <5 mg/dL (ref <5)

## 2014-08-27 LAB — PROTIME-INR
INR: 1.16 (ref 0.00–1.49)
Prothrombin Time: 15 seconds (ref 11.6–15.2)

## 2014-08-27 LAB — APTT: aPTT: 22 seconds — ABNORMAL LOW (ref 24–37)

## 2014-08-27 LAB — TROPONIN I

## 2014-08-27 LAB — MAGNESIUM: Magnesium: 1.7 mg/dL (ref 1.7–2.4)

## 2014-08-27 MED ORDER — ACETAMINOPHEN 650 MG RE SUPP: 650.0000 mg | Freq: Four times a day (QID) | RECTAL | Status: DC | PRN

## 2014-08-27 MED ORDER — DEXTROSE 5 % IV SOLN
1.0000 g | INTRAVENOUS | Status: DC
  Administered 2014-08-28 – 2014-08-29 (×3): 1 g via INTRAVENOUS
  Filled 2014-08-27 (×4): qty 10

## 2014-08-27 MED ORDER — ACETAMINOPHEN 325 MG PO TABS: 650.0000 mg | ORAL_TABLET | Freq: Four times a day (QID) | ORAL | Status: DC | PRN

## 2014-08-27 MED ORDER — AMLODIPINE BESYLATE 5 MG PO TABS
5.0000 mg | ORAL_TABLET | Freq: Every day | ORAL | Status: DC
  Administered 2014-08-27 – 2014-08-29 (×2): 5 mg via ORAL
  Filled 2014-08-27 (×3): qty 1

## 2014-08-27 MED ORDER — CYCLOBENZAPRINE HCL 5 MG PO TABS: 5.0000 mg | ORAL_TABLET | Freq: Three times a day (TID) | ORAL | Status: DC | PRN

## 2014-08-27 MED ORDER — INSULIN DETEMIR 100 UNIT/ML ~~LOC~~ SOLN
40.0000 [IU] | Freq: Every day | SUBCUTANEOUS | Status: DC
  Administered 2014-08-28 – 2014-08-29 (×2): 40 [IU] via SUBCUTANEOUS
  Filled 2014-08-27 (×2): qty 0.4

## 2014-08-27 MED ORDER — INSULIN DETEMIR 100 UNIT/ML ~~LOC~~ SOLN
30.0000 [IU] | Freq: Every day | SUBCUTANEOUS | Status: DC
  Administered 2014-08-27 – 2014-08-28 (×2): 30 [IU] via SUBCUTANEOUS
  Filled 2014-08-27 (×2): qty 0.3

## 2014-08-27 MED ORDER — INSULIN ASPART 100 UNIT/ML ~~LOC~~ SOLN
6.0000 [IU] | Freq: Once | SUBCUTANEOUS | Status: AC
  Administered 2014-08-27: 6 [IU] via SUBCUTANEOUS

## 2014-08-27 MED ORDER — DEXTROSE 5 % IV SOLN
1.0000 g | Freq: Once | INTRAVENOUS | Status: AC
  Administered 2014-08-27: 1 g via INTRAVENOUS
  Filled 2014-08-27: qty 10

## 2014-08-27 MED ORDER — VITAMIN B-12 1000 MCG PO TABS
1000.0000 ug | ORAL_TABLET | Freq: Every day | ORAL | Status: DC
  Administered 2014-08-27 – 2014-08-28 (×2): 1000 ug via ORAL
  Filled 2014-08-27 (×3): qty 1

## 2014-08-27 MED ORDER — SODIUM CHLORIDE 0.9 % IV SOLN
INTRAVENOUS | Status: DC
  Administered 2014-08-27: 18:00:00 via INTRAVENOUS

## 2014-08-27 MED ORDER — ONDANSETRON HCL 4 MG PO TABS: 4.0000 mg | ORAL_TABLET | Freq: Four times a day (QID) | ORAL | Status: DC | PRN

## 2014-08-27 MED ORDER — ONDANSETRON HCL 4 MG/2ML IJ SOLN
4.0000 mg | Freq: Four times a day (QID) | INTRAMUSCULAR | Status: DC | PRN
  Administered 2014-08-28: 4 mg via INTRAVENOUS
  Filled 2014-08-27: qty 2

## 2014-08-27 MED ORDER — OXYMETAZOLINE HCL 0.05 % NA SOLN
1.0000 | Freq: Two times a day (BID) | NASAL | Status: DC | PRN
  Filled 2014-08-27: qty 15

## 2014-08-27 MED ORDER — METOPROLOL SUCCINATE ER 25 MG PO TB24
25.0000 mg | ORAL_TABLET | Freq: Every day | ORAL | Status: DC
  Administered 2014-08-27 – 2014-08-29 (×3): 25 mg via ORAL
  Filled 2014-08-27 (×4): qty 1

## 2014-08-27 MED ORDER — SODIUM CHLORIDE 0.9 % IV SOLN
Freq: Once | INTRAVENOUS | Status: AC
  Administered 2014-08-27: 15:00:00 via INTRAVENOUS

## 2014-08-27 NOTE — ED Provider Notes (Signed)
CSN: [id number]     Arrival date & time 08/27/14  1233 History   First MD Initiated Contact with Patient 08/27/14 1339     Chief Complaint  Patient presents with  . Altered Mental Status  LEVEL 5 CAVEAT DUE TO ALTERED MENTAL STATUS    Patient is a 74 y.o. female presenting with altered mental status. The history is provided by the patient and the spouse.  Altered Mental Status Presenting symptoms: confusion   Severity:  Moderate Most recent episode:  More than 2 days ago Timing:  Constant Progression:  Worsening Chronicity:  New Associated symptoms: no fever    PT PRESENTS FOR ALTERED MENTAL STATUS PER HUSBAND PT HAS BEEN ACTING STRANGELY SINCE 7/11 NO FALLS NO NEW MEDS NO FEVER/VOMITING   PT IS UNABLE TO PROVIDE ANY HISTORY AT THIS TIME   Past Medical History  Diagnosis Date  . Anemia   . S/P endoscopy     with laser treatment  08/19/2006  . History of epistaxis   . Hyperlipidemia   . Telangiectasia     Gastric   . Lichen planus     Both lower extremities  . GERD (gastroesophageal reflux disease)   . Heart murmur   . CHF (congestive heart failure)   . Pneumonia 1990's    "twice" (09/15/2012)  . Type II diabetes mellitus     insulin requiring.  Marland Kitchen History of blood transfusion     "getting 2 today; have had transfusions before" (09/15/2012)  . Family history of anesthesia complication     "niece has a hard time coming out" (09/15/2012)  . Chronic diastolic CHF (congestive heart failure) 10/03/2013  . HTN (hypertension), benign 03/02/2012   Past Surgical History  Procedure Laterality Date  . Nose surgery      "for bleeding" (09/15/2012)  . Savory dilation  02/26/2011    Procedure: SAVORY DILATION;  Surgeon: [name], MD;  Location: [name];  Service: Endoscopy;  Laterality: N/A;  c-arm needed  . Esophagogastroduodenoscopy  02/26/2011    Procedure: ESOPHAGOGASTRODUODENOSCOPY (EGD);  Surgeon: [name], MD;  Location: [name];  Service: Endoscopy;   Laterality: N/A;  . Esophagogastroduodenoscopy N/A 11/08/2012    Procedure: ESOPHAGOGASTRODUODENOSCOPY (EGD);  Surgeon: [name], MD;  Location: [name];  Service: Endoscopy;  Laterality: N/A;  . Cataract surgery    . Esophagogastroduodenoscopy N/A 10/04/2013    Procedure: ESOPHAGOGASTRODUODENOSCOPY (EGD);  Surgeon: Winfield Cunas., MD;  Location: [name];  Service: Endoscopy;  Laterality: N/A;  with APC on stand-by  . Esophagogastroduodenoscopy N/A 07/06/2014    Procedure: ESOPHAGOGASTRODUODENOSCOPY (EGD);  Surgeon: Clarene Essex, MD;  Location: [name];  Service: Endoscopy;  Laterality: N/A;  . Hot hemostasis N/A 07/06/2014    Procedure: HOT HEMOSTASIS (ARGON PLASMA COAGULATION/BICAP);  Surgeon: Clarene Essex, MD;  Location: [name];  Service: Endoscopy;  Laterality: N/A;   Family History  Problem Relation Age of Onset  . Breast cancer    . Malignant hyperthermia Neg Hx   . Stroke Father    History  Substance Use Topics  . Smoking status: Former Smoker -- 2 years    Types: Cigarettes    Quit date: 02/10/1971  . Smokeless tobacco: Never Used     Comment: 09/15/2012 "smoked 50-60 yr ago"  . Alcohol Use: No   OB History    No data available     Review of Systems  Unable to perform ROS: Mental status change  Constitutional: Negative for fever.  Psychiatric/Behavioral:  Positive for confusion.      Allergies  Aspirin  Home Medications   Prior to Admission medications   Medication Sig Start Date End Date Taking? Authorizing Provider  amitriptyline (ELAVIL) 25 MG tablet Take 1 tablet by mouth at bedtime as needed for sleep.  07/04/14   Historical Provider, MD  amLODipine (NORVASC) 5 MG tablet Take 1 tablet (5 mg total) by mouth daily. 08/10/14   [name], MD  enalapril (VASOTEC) 20 MG tablet Take 20 mg by mouth every morning.     Historical Provider, MD  furosemide (LASIX) 40 MG tablet Take 40mg  twice daily for two days and then start taking 40mg  once  daily. 07/30/14   [name], MD  insulin detemir (LEVEMIR) 100 UNIT/ML injection Inject 0.3 mLs (30 Units total) into the skin at bedtime. Patient taking differently: Inject 30-40 Units into the skin 2 (two) times daily. 40 units in the Morning, and 30 units at Bedtime 10/05/13   Robbie Lis, MD  metoprolol succinate (TOPROL XL) 25 MG 24 hr tablet Take 1 tablet (25 mg total) by mouth daily. 07/27/14   [name], MD  oxymetazoline (AFRIN) 0.05 % nasal spray Place 1 spray into both nostrils 2 (two) times daily as needed (Epistaxis). 10/05/13   Robbie Lis, MD  pantoprazole (PROTONIX) 40 MG tablet Take 1 tablet (40 mg total) by mouth 2 (two) times daily. 07/07/14   Hosie Poisson, MD  vitamin B-12 (CYANOCOBALAMIN) 1000 MCG tablet Take 1,000 mcg by mouth at bedtime.     Historical Provider, MD  Vitamin D, Ergocalciferol, (DRISDOL) 50000 UNITS CAPS Take 50,000 Units by mouth every 7 (seven) days. Takes on Monday    Historical Provider, MD   BP 154/49 mmHg  Pulse 102  Temp(Src) 99.2 F (37.3 C) (Rectal)  Resp 16  SpO2 100% Physical Exam CONSTITUTIONAL: pt is confused and slightly agitated HEAD: Normocephalic/atraumatic EYES: EOMI/PERRL ENMT: Mucous membranes moist NECK: supple no meningeal signs SPINE/BACK:entire spine nontender CV: [id number] noted, systolic murmur noted LUNGS: Lungs are clear to auscultation bilaterally, no apparent distress ABDOMEN: soft, nontender, no rebound or guarding, bowel sounds noted throughout abdomen GU:no cva tenderness NEURO: Pt is awake/alert, moves all extremitiesx4.  No facial droop.  She is confused.  She is unsure of date.  She repeats all questions that I discuss but never gives an answer EXTREMITIES: pulses normal/equal, full ROM SKIN: warm, color normal PSYCH: unable to assess  ED Course  Procedures   2:36 PM Pt with AMS of unclear etiology tPA in stroke considered but not given due to: Onset over 3-4.5hours 3:39 PM Pt more alert at this  time Probable UTI Pt also dehydrated Will admit D/w dr Charlies Silvers Will admit   Labs Review Labs Reviewed  URINALYSIS, ROUTINE W REFLEX MICROSCOPIC (NOT AT [facility]) - Abnormal; Notable for the following:    APPearance CLOUDY (*)    Hgb urine dipstick TRACE (*)    Leukocytes, UA MODERATE (*)    All other components within normal limits  CBC - Abnormal; Notable for the following:    RBC 3.79 (*)    Hemoglobin 9.3 (*)    HCT 31.7 (*)    MCH 24.5 (*)    MCHC 29.3 (*)    RDW 20.4 (*)    All other components within normal limits  COMPREHENSIVE METABOLIC PANEL - Abnormal; Notable for the following:    Chloride 100 (*)    Glucose, Bld 426 (*)    BUN  29 (*)    Creatinine, Ser 1.67 (*)    GFR calc non Af Amer 29 (*)    GFR calc Af Amer 34 (*)    All other components within normal limits  URINE MICROSCOPIC-ADD ON - Abnormal; Notable for the following:    Squamous Epithelial / LPF MANY (*)    Bacteria, UA FEW (*)    Casts HYALINE CASTS (*)    All other components within normal limits  CULTURE, BLOOD (ROUTINE X 2)  CULTURE, BLOOD (ROUTINE X 2)  URINE CULTURE  ETHANOL  DIFFERENTIAL  URINE RAPID DRUG SCREEN, HOSP PERFORMED  TROPONIN I    Imaging Review Dg Chest Portable 1 View  08/27/2014   CLINICAL DATA:  Confusion and weakness  EXAM: PORTABLE CHEST - 1 VIEW  COMPARISON:  October 03, 2013  FINDINGS: Port-A-Cath tip is in the superior vena cava. No pneumothorax. There is no appreciable edema or consolidation. Heart is borderline prominent with pulmonary vascularity within normal limits. There is atherosclerotic change in the aortic arch region. No adenopathy. No bone lesions.  IMPRESSION: No edema or consolidation.   Electronically Signed   By: Lowella Grip III M.D.   On: 08/27/2014 14:27     EKG Interpretation   Date/Time:  Monday August 27 2014 13:07:36 EDT Ventricular Rate:  102 PR Interval:  135 QRS Duration: 70 QT Interval:  351 QTC Calculation: 457 R Axis:   29 Text  Interpretation:  Sinus tachycardia Multiform ventricular premature  complexes Consider left atrial enlargement Abnormal R-wave progression,  early transition LVH with secondary repolarization abnormality Inferior  infarct, age indeterminate Baseline wander in lead(s) II III aVF V6  significant artifact limits evaluation Confirmed by Christy Gentles  MD, Lavance Beazer  [phone]) on 08/27/2014 2:18:15 PM     Medications  cefTRIAXone (ROCEPHIN) 1 g in dextrose 5 % 50 mL IVPB (1 g Intravenous New Bag/Given 08/27/14 1504)  0.9 %  sodium chloride infusion ( Intravenous New Bag/Given 08/27/14 1503)    MDM   Final diagnoses:  AKI (acute kidney injury)  UTI (lower urinary tract infection)  Delirium    Nursing notes including past medical history and social history reviewed and considered in documentation Labs/vital reviewed myself and considered during evaluation xrays/imaging reviewed by myself and considered during evaluation Previous records reviewed and considered     [name], MD 08/27/14 1541

## 2014-08-27 NOTE — ED Notes (Signed)
Pt husband states that over the past week pt has been confused and forgetting where she is at.

## 2014-08-27 NOTE — H&P (Addendum)
[facility] History and Physical  [name] [id number] DOB: 1940-03-07 DOA: 08/27/2014  Referring physician: ER physician: Dr. [name] PCP: Philis Fendt, MD  Chief Complaint: altered mental status   HPI:  74 y.o. Female with past medical history of hypertension, chronic disatolic CHF (2 D ECHO in 06/2014 revealed grade 2 diastolic dysfunction and severe ASSH), Osler-weber-rendu disase, gastric AVMs and chronic iron deficiency anemia requiring transfusion. She presented to [facility] ED with altered mental status as per her husband. He is not present at this time at the bedside so information obtain from the chart and ED physician. Apparently the patient was confused over past day or so but no specific concerns such as respiratory distress, fevers or cough. No reports of abdominal pain, nausea or vomiting. No reports of bleeding. No chest pain. No lightheadedness or loss of consciousness.   In ED, BP was 1440/43, HR 99, T max 99.2 F and oxygen saturation of 98% on room air. Blood work revealed hemoglobin of 9.3, creatinine of 1.67 (baseline 1.3), normal troponin level, glucose 426. The 12 lead EKG showed sinus tachycardia. UA on admission showed moderate leukocytes. She was started on empiric rocephin for treatment of UTI.   Assessment & Plan    Principal Problem:   Acute encephalopathy / UTI (lower urinary tract infection) - Altered mental status likely due to UTI - UA on admission showed moderate leukocytes  - Started rocephin IV daily - Follow up urine culture results  Active Problems: CKD (chronic kidney disease) stage 3, GFR 30-59 ml/min  - Baseline creatinine 1.3 and on this admission 1.6 - Possibly from lasix which was placed on hold - Continue IV fluids for next 12 hours - Follow up BMP tomorrow am  Iron deficiency anemia / Anemia of chronic disease - Anemia likely due to combination of iron deficiency and anemia of CKD - Hemoglobin is 9.7 - No current  indications for transfusion  Essential hypertension - Resume Norvasc 5 mg daily  - Resume metoprolol   Chronic diastolic CHF - Grade 2 as seen on 2 D ECHO in 06/2014 - Compensated - Lasix on hold due to worsening renal insufficiency   DVT prophylaxis:  - SCD's bilaterally   Radiological Exams on Admission: Ct Head Wo Contrast 08/27/2014   No acute intracranial finding. Age related atrophy. Mild chronic appearing small vessel change of the white matter.  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    Dg Chest Portable 1 View 08/27/2014  No edema or consolidation.     EKG: I have personally reviewed EKG. EKG shows sinus tachycardia   Code Status: Full Family Communication: Family not at the bedside  Disposition Plan: Admit for further evaluation, medical floor   Leisa Lenz, MD  [facility] Pager [phone]  Time spent in minutes: 75 minutes  Review of Systems:  Constitutional: Negative for fever, chills and malaise/fatigue. Negative for diaphoresis.  HENT: Negative for hearing loss, ear pain, nosebleeds, congestion, sore throat, neck pain, tinnitus and ear discharge.   Eyes: Negative for blurred vision, double vision, photophobia, pain, discharge and redness.  Respiratory: Negative for cough, hemoptysis, sputum production, shortness of breath, wheezing and stridor.   Cardiovascular: Negative for chest pain, palpitations, orthopnea, claudication and leg swelling.  Gastrointestinal: Negative for nausea, vomiting and abdominal pain. Negative for heartburn, constipation, blood in stool and melena.  Genitourinary: Negative for dysuria, urgency, frequency, hematuria and flank pain.  Musculoskeletal: Negative for myalgias, back pain, joint pain and falls.  Skin: Negative for itching  and rash.  Neurological: per HPI Endo/Heme/Allergies: Negative for environmental allergies and polydipsia. Does not bruise/bleed easily.  Psychiatric/Behavioral: Negative for suicidal ideas. The patient  is not nervous/anxious.      Past Medical History  Diagnosis Date  . Anemia   . S/P endoscopy     with laser treatment  08/19/2006  . History of epistaxis   . Hyperlipidemia   . Telangiectasia     Gastric   . Lichen planus     Both lower extremities  . GERD (gastroesophageal reflux disease)   . Heart murmur   . CHF (congestive heart failure)   . Pneumonia 1990's    "twice" (09/15/2012)  . Type II diabetes mellitus     insulin requiring.  Marland Kitchen History of blood transfusion     "getting 2 today; have had transfusions before" (09/15/2012)  . Family history of anesthesia complication     "niece has a hard time coming out" (09/15/2012)  . Chronic diastolic CHF (congestive heart failure) 10/03/2013  . HTN (hypertension), benign 03/02/2012   Past Surgical History  Procedure Laterality Date  . Nose surgery      "for bleeding" (09/15/2012)  . Savory dilation  02/26/2011    Procedure: SAVORY DILATION;  Surgeon: [name], MD;  Location: [name];  Service: Endoscopy;  Laterality: N/A;  c-arm needed  . Esophagogastroduodenoscopy  02/26/2011    Procedure: ESOPHAGOGASTRODUODENOSCOPY (EGD);  Surgeon: [name], MD;  Location: [name];  Service: Endoscopy;  Laterality: N/A;  . Esophagogastroduodenoscopy N/A 11/08/2012    Procedure: ESOPHAGOGASTRODUODENOSCOPY (EGD);  Surgeon: [name], MD;  Location: [name];  Service: Endoscopy;  Laterality: N/A;  . Cataract surgery    . Esophagogastroduodenoscopy N/A 10/04/2013    Procedure: ESOPHAGOGASTRODUODENOSCOPY (EGD);  Surgeon: Winfield Cunas., MD;  Location: [name];  Service: Endoscopy;  Laterality: N/A;  with APC on stand-by  . Esophagogastroduodenoscopy N/A 07/06/2014    Procedure: ESOPHAGOGASTRODUODENOSCOPY (EGD);  Surgeon: Clarene Essex, MD;  Location: [name];  Service: Endoscopy;  Laterality: N/A;  . Hot hemostasis N/A 07/06/2014    Procedure: HOT HEMOSTASIS (ARGON PLASMA COAGULATION/BICAP);  Surgeon: Clarene Essex, MD;   Location: [name];  Service: Endoscopy;  Laterality: N/A;   Social History:  reports that she quit smoking about 43 years ago. Her smoking use included Cigarettes. She quit after 2 years of use. She has never used smokeless tobacco. She reports that she does not drink alcohol or use illicit drugs.  Allergies  Allergen Reactions  . Aspirin Nausea And Vomiting    Family History:  Family History  Problem Relation Age of Onset  . Breast cancer    . Malignant hyperthermia Neg Hx   . Stroke Father      Prior to Admission medications   Medication Sig Start Date End Date Taking? Authorizing Provider  amLODipine (NORVASC) 5 MG tablet Take 1 tablet (5 mg total) by mouth daily. 08/10/14  Yes [name], MD  furosemide (LASIX) 40 MG tablet Take 40mg  twice daily for two days and then start taking 40mg  once daily. 07/30/14  Yes [name], MD  insulin detemir (LEVEMIR) 100 UNIT/ML injection Inject 0.3 mLs (30 Units total) into the skin at bedtime. Patient taking differently: Inject 30-40 Units into the skin 2 (two) times daily. 40 units in the Morning, and 30 units at Bedtime 10/05/13  Yes Robbie Lis, MD  oxymetazoline (AFRIN) 0.05 % nasal spray Place 1 spray into both nostrils 2 (two) times  daily as needed (Epistaxis). 10/05/13  Yes Robbie Lis, MD  pantoprazole (PROTONIX) 40 MG tablet Take 1 tablet (40 mg total) by mouth 2 (two) times daily. 07/07/14  Yes Hosie Poisson, MD  vitamin B-12 (CYANOCOBALAMIN) 1000 MCG tablet Take 1,000 mcg by mouth at bedtime.    Yes Historical Provider, MD  Vitamin D, Ergocalciferol, (DRISDOL) 50000 UNITS CAPS Take 50,000 Units by mouth every 7 (seven) days. Takes on Monday   Yes Historical Provider, MD   Physical Exam: Filed Vitals:   08/27/14 1330 08/27/14 1400 08/27/14 1412 08/27/14 1430  BP: 145/59 154/49    Pulse: 99 102  112  Temp:   99.2 F (37.3 C)   TempSrc:   Rectal   Resp: 16   16  SpO2: 100%   100%    Physical Exam  Constitutional:  Appears well-developed and well-nourished. No distress.  HENT: Normocephalic. No tonsillar erythema or exudates Eyes: Conjunctivae are normal. No scleral icterus.  Neck: Normal ROM. Neck supple. No JVD. No tracheal deviation. No thyromegaly.  CVS: RRR, S1/S2 appreciated   Pulmonary: Effort and breath sounds normal, no stridor, rhonchi, wheezes, rales.  Abdominal: Soft. BS +,  no distension, tenderness, rebound or guarding.  Musculoskeletal: Normal range of motion. No edema and no tenderness.  Lymphadenopathy: No lymphadenopathy noted, cervical, inguinal. Neuro: Alert. Normal reflexes, muscle tone coordination. No focal neurologic deficits. Skin: Skin is warm and dry. No rash noted.  No erythema. No pallor.  Psychiatric: Normal mood and affect.  Labs on Admission:  Basic Metabolic Panel:  Recent Labs Lab 08/27/14 1400  NA 138  K 4.1  CL 100*  CO2 25  GLUCOSE 426*  BUN 29*  CREATININE 1.67*  CALCIUM 9.2   Liver Function Tests:  Recent Labs Lab 08/27/14 1400  AST 28  ALT 19  ALKPHOS 90  BILITOT 0.9  PROT 6.8  ALBUMIN 3.7   No results for input(s): LIPASE, AMYLASE in the last 168 hours. No results for input(s): AMMONIA in the last 168 hours. CBC:  Recent Labs Lab 08/27/14 1400  WBC 7.9  NEUTROABS 5.9  HGB 9.3*  HCT 31.7*  MCV 83.6  PLT 237   Cardiac Enzymes:  Recent Labs Lab 08/27/14 1400  TROPONINI <0.03   BNP: Invalid input(s): POCBNP CBG: No results for input(s): GLUCAP in the last 168 hours.  If 7PM-7AM, please contact night-coverage [email or web address] Password [facility] 08/27/2014, 3:54 PM

## 2014-08-27 NOTE — ED Notes (Addendum)
md at bedside, per md request rn printed off pts blood work to give to pts family

## 2014-08-27 NOTE — ED Notes (Signed)
Pt to CT

## 2014-08-27 NOTE — ED Notes (Signed)
Report given to Ana, RN

## 2014-08-27 NOTE — ED Notes (Addendum)
see triage note. husband not giving more information. pt kept talking about satan. pt unable to follow some simple commands, unable to remember who the president was, kept saying works like mohaha, mobaha, could not get "obama" out. denies pain, reports leg cramps in past. when asked what 5+5 was, pt repeated questiong multiple times, before finally answering correctly. pt could not follow directions to drag heel down shin, kept dragging heel near shin, but not on shin even after repeated direction  Husband said pt started acting weird 7/11, pt was grunting and sitting in bathtub with clothes on, and no water running. Since then husband has had to repeat stuff. Pt has tried to drink out of a water bottle with cap still on.

## 2014-08-27 NOTE — ED Notes (Signed)
rn attempted to call report, floor nurse will call back when ready

## 2014-08-28 ENCOUNTER — Inpatient Hospital Stay (HOSPITAL_COMMUNITY): Payer: Medicare Other

## 2014-08-28 LAB — GLUCOSE, RANDOM: Glucose, Bld: 526 mg/dL — ABNORMAL HIGH (ref 65–99)

## 2014-08-28 LAB — CBC
HCT: 30.3 % — ABNORMAL LOW (ref 36.0–46.0)
Hemoglobin: 9.1 g/dL — ABNORMAL LOW (ref 12.0–15.0)
MCH: 24.9 pg — AB (ref 26.0–34.0)
MCHC: 30 g/dL (ref 30.0–36.0)
MCV: 83 fL (ref 78.0–100.0)
Platelets: 285 10*3/uL (ref 150–400)
RBC: 3.65 MIL/uL — AB (ref 3.87–5.11)
RDW: 20 % — AB (ref 11.5–15.5)
WBC: 8.7 10*3/uL (ref 4.0–10.5)

## 2014-08-28 LAB — COMPREHENSIVE METABOLIC PANEL
ALT: 18 U/L (ref 14–54)
ANION GAP: 8 (ref 5–15)
AST: 25 U/L (ref 15–41)
Albumin: 3.5 g/dL (ref 3.5–5.0)
Alkaline Phosphatase: 85 U/L (ref 38–126)
BUN: 28 mg/dL — ABNORMAL HIGH (ref 6–20)
CO2: 26 mmol/L (ref 22–32)
Calcium: 9 mg/dL (ref 8.9–10.3)
Chloride: 105 mmol/L (ref 101–111)
Creatinine, Ser: 1.37 mg/dL — ABNORMAL HIGH (ref 0.44–1.00)
GFR calc Af Amer: 43 mL/min — ABNORMAL LOW (ref >60)
GFR calc non Af Amer: 37 mL/min — ABNORMAL LOW (ref >60)
Glucose, Bld: 197 mg/dL — ABNORMAL HIGH (ref 65–99)
Potassium: 3.8 mmol/L (ref 3.5–5.1)
Sodium: 139 mmol/L (ref 135–145)
TOTAL PROTEIN: 6.7 g/dL (ref 6.5–8.1)
Total Bilirubin: 0.7 mg/dL (ref 0.3–1.2)

## 2014-08-28 LAB — GLUCOSE, CAPILLARY
Glucose-Capillary: 254 mg/dL — ABNORMAL HIGH (ref 65–99)
Glucose-Capillary: 450 mg/dL — ABNORMAL HIGH (ref 65–99)
Glucose-Capillary: 454 mg/dL — ABNORMAL HIGH (ref 65–99)
Glucose-Capillary: 284 mg/dL — ABNORMAL HIGH (ref 65–99)

## 2014-08-28 MED ORDER — INSULIN ASPART 100 UNIT/ML ~~LOC~~ SOLN
0.0000 [IU] | Freq: Every day | SUBCUTANEOUS | Status: DC
  Administered 2014-08-28: 3 [IU] via SUBCUTANEOUS

## 2014-08-28 MED ORDER — INSULIN ASPART 100 UNIT/ML ~~LOC~~ SOLN
0.0000 [IU] | Freq: Three times a day (TID) | SUBCUTANEOUS | Status: DC
  Administered 2014-08-28: 20 [IU] via SUBCUTANEOUS
  Administered 2014-08-29: 7 [IU] via SUBCUTANEOUS

## 2014-08-28 MED ORDER — CYCLOBENZAPRINE HCL 5 MG PO TABS: 5.0000 mg | ORAL_TABLET | Freq: Three times a day (TID) | ORAL | Status: DC | PRN

## 2014-08-28 MED ORDER — INSULIN ASPART 100 UNIT/ML ~~LOC~~ SOLN
10.0000 [IU] | Freq: Once | SUBCUTANEOUS | Status: AC
  Administered 2014-08-28: 10 [IU] via SUBCUTANEOUS

## 2014-08-28 MED ORDER — HEPARIN SOD (PORK) LOCK FLUSH 100 UNIT/ML IV SOLN
500.0000 [IU] | Freq: Once | INTRAVENOUS | Status: DC
  Filled 2014-08-28: qty 5

## 2014-08-28 MED ORDER — NITROFURANTOIN MONOHYD MACRO 100 MG PO CAPS: 100.0000 mg | ORAL_CAPSULE | Freq: Two times a day (BID) | ORAL | Status: DC

## 2014-08-28 MED ORDER — HALOPERIDOL LACTATE 5 MG/ML IJ SOLN: 1.0000 mg | Freq: Four times a day (QID) | INTRAMUSCULAR | Status: DC | PRN

## 2014-08-28 MED ORDER — LORAZEPAM 2 MG/ML IJ SOLN
1.0000 mg | Freq: Four times a day (QID) | INTRAMUSCULAR | Status: DC | PRN
  Administered 2014-08-28: 1 mg via INTRAVENOUS
  Filled 2014-08-28: qty 1

## 2014-08-28 NOTE — Discharge Instructions (Signed)

## 2014-08-28 NOTE — Progress Notes (Signed)
Inpatient Diabetes Program Recommendations  AACE/ADA: New Consensus Statement on Inpatient Glycemic Control (2013)  Target Ranges:  Prepandial:   less than 140 mg/dL      Peak postprandial:   less than 180 mg/dL (1-2 hours)      Critically ill patients:  140 - 180 mg/dL   Reason for Visit: Hyperglycemia  Diabetes history: DM2 Outpatient Diabetes medications: Levemir 40 in am and 30 QHS Current orders for Inpatient glycemic control: Same  Results for LYNNIA, KENION (MRN [id number]) as of 08/28/2014 09:42  Ref. Range 07/13/2014 12:43 08/27/2014 17:42 08/27/2014 21:36 08/28/2014 08:52  Glucose-Capillary Latest Ref Range: 65-99 mg/dL 234 (H) 297 (H) 424 (H) 254 (H)  Results for AUBRIANA, SARAH (MRN [id number]) as of 08/28/2014 09:42  Ref. Range 08/27/2014 14:00 08/27/2014 18:00 08/28/2014 06:13  Glucose Latest Ref Range: 65-99 mg/dL 426 (H) 332 (H) 197 (H)   Needs correction insulin.  Inpatient Diabetes Program Recommendations Correction (SSI): Add Novolog moderate tidwc and hs HgbA1C: Need updated HgbA1C Diet: Change diet to CHO mod med  Note: Will follow. Thank you. Lorenda Peck, RD, LDN, CDE Inpatient Diabetes Coordinator [phone]

## 2014-08-28 NOTE — Discharge Summary (Addendum)
Physician Discharge Summary  [name] [id number] DOB: 1940/11/22 DOA: 08/27/2014  PCP: Philis Fendt, MD  Admit date: 08/27/2014 Discharge date: 08/28/2014  Recommendations for Outpatient Follow-up:  1. With PCP in 1 week for follow up 2. Flexeril is the new medication for muscle spasms as needed  Discharge Diagnoses:  Principal Problem:   Acute encephalopathy Active Problems:   UTI (lower urinary tract infection)   Iron deficiency anemia   CKD (chronic kidney disease) stage 3, GFR 30-59 ml/min   Anemia of chronic disease    Discharge Condition: stable  Diet recommendation: as tolerated   History of present illness:  74 y.o. Female with past medical history of hypertension, chronic disatolic CHF (2 D ECHO in 06/2014 revealed grade 2 diastolic dysfunction and severe ASSH), Osler-weber-rendu disase, gastric AVMs and chronic iron deficiency anemia requiring transfusion. She presented to [facility] ED with altered mental status. CT head and chest x-ray did not reveal acute findings. She was however found to have urinary tract infection and started empirically on Rocephin while awaiting urine culture results. Her mental status is much better this morning. Patient's daughter at the bedside and patient want to go home today.   Hospital Course:   Principal Problem:  Acute encephalopathy / UTI (lower urinary tract infection) - Altered mental status likely due to UTI - UA on admission showed moderate leukocytes  - Mental status much better this morning  - Patient was started on empiric Rocephin and she has received total of 3 doses of IV Rocephin. She does not need antibiotics on discharge.   Active Problems: CKD (chronic kidney disease) stage 3, GFR 30-59 ml/min  - Baseline creatinine 1.3 and on this admission 1.6 - Possibly from lasix which was placed on hold. - Creatinine is back to baseline number. She can resume Lasix on discharge.  Iron deficiency anemia / Anemia of  chronic disease - Anemia likely due to combination of iron deficiency and anemia of CKD - Hemoglobin is 9.7  Essential hypertension - Continue Norvasc and metoprolol on discharge  Chronic diastolic CHF - Grade 2 as seen on 2 D ECHO in 06/2014 - Compensated - Resume Lasix on discharge  DVT prophylaxis:  - SCD's bilaterally while patient in hospital    Signed:      Leisa Lenz, MD  [facility] 08/28/2014, 10:08 AM  Pager #: [phone]  Time spent in minutes: more than 30 minutes   Discharge Exam: Filed Vitals:   08/28/14 0442  BP: 133/54  Pulse: 79  Temp: 98.2 F (36.8 C)  Resp: 16   Filed Vitals:   08/27/14 1734 08/27/14 1938 08/27/14 2139 08/28/14 0442  BP: 148/73  154/73 133/54  Pulse: 101  105 79  Temp: 98.4 F (36.9 C)  98.2 F (36.8 C) 98.2 F (36.8 C)  TempSrc: Oral  Oral Oral  Resp: 16  16 16   Height: 5\' 3"  (1.6 m)     Weight:  61.689 kg (136 lb)  61.689 kg (136 lb)  SpO2: 99%  100% 100%    General: Pt is alert, not in acute distress Cardiovascular: Regular rate and rhythm, S1/S2 + Respiratory: Clear to auscultation bilaterally, no wheezing, no crackles, no rhonchi Abdominal: Soft, non tender, non distended, bowel sounds +, no guarding Extremities: no edema, no cyanosis, pulses palpable bilaterally DP and PT Neuro: Grossly nonfocal  Discharge Instructions  Discharge Instructions    Call MD for:  difficulty breathing, headache or visual disturbances    Complete by:  As  directed      Call MD for:  persistant dizziness or light-headedness    Complete by:  As directed      Call MD for:  persistant nausea and vomiting    Complete by:  As directed      Call MD for:  severe uncontrolled pain    Complete by:  As directed      Diet - low sodium heart healthy    Complete by:  As directed      Discharge instructions    Complete by:  As directed        Increase activity slowly    Complete by:  As directed             Medication  List    TAKE these medications        amLODipine 5 MG tablet  Commonly known as:  NORVASC  Take 1 tablet (5 mg total) by mouth daily.     cyclobenzaprine 5 MG tablet  Commonly known as:  FLEXERIL  Take 1 tablet (5 mg total) by mouth 3 (three) times daily as needed for muscle spasms.     furosemide 40 MG tablet  Commonly known as:  LASIX  Take 40mg  twice daily for two days and then start taking 40mg  once daily.     insulin detemir 100 UNIT/ML injection  Commonly known as:  LEVEMIR  Inject 0.3 mLs (30 Units total) into the skin at bedtime.     metoprolol succinate 25 MG 24 hr tablet  Commonly known as:  TOPROL XL  Take 1 tablet (25 mg total) by mouth daily.     oxymetazoline 0.05 % nasal spray  Commonly known as:  AFRIN  Place 1 spray into both nostrils 2 (two) times daily as needed (Epistaxis).     pantoprazole 40 MG tablet  Commonly known as:  PROTONIX  Take 1 tablet (40 mg total) by mouth 2 (two) times daily.     vitamin B-12 1000 MCG tablet  Commonly known as:  CYANOCOBALAMIN  Take 1,000 mcg by mouth at bedtime.     Vitamin D (Ergocalciferol) 50000 UNITS Caps capsule  Commonly known as:  DRISDOL  Take 50,000 Units by mouth every 7 (seven) days. Takes on Monday            Follow-up Information    Follow up with AVBUERE,EDWIN A, MD. Schedule an appointment as soon as possible for a visit in 1 week.   Specialty:  Internal Medicine   Why:  Follow up appt after recent hospitalization   Contact information:   [place] [place] [address] [phone]        The results of significant diagnostics from this hospitalization (including imaging, microbiology, ancillary and laboratory) are listed below for reference.    Significant Diagnostic Studies: Ct Head Wo Contrast  08/27/2014   CLINICAL DATA:  Mental status changes and confusion since 08/20/2014  EXAM: CT HEAD WITHOUT CONTRAST  TECHNIQUE: Contiguous axial images were obtained from the base of the  skull through the vertex without intravenous contrast.  COMPARISON:  None.  FINDINGS: The brain shows generalized atrophy. There chronic small-vessel ischemic changes affecting the hemispheric white matter. No sign of acute infarction, mass lesion, hemorrhage, hydrocephalus or extra-axial collection. No calvarial abnormality. There is atherosclerotic calcification of the major vessels at the base of the brain. Chronic appearing opacification of the right maxillary sinus with slight expansion raising the possibility of mucocele.  IMPRESSION: No acute intracranial finding.  Age related atrophy. Mild chronic appearing small vessel change of the white matter.  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   Electronically Signed   By: Nelson Chimes M.D.   On: 08/27/2014 14:31   Dg Chest Portable 1 View  08/27/2014   CLINICAL DATA:  Confusion and weakness  EXAM: PORTABLE CHEST - 1 VIEW  COMPARISON:  October 03, 2013  FINDINGS: Port-A-Cath tip is in the superior vena cava. No pneumothorax. There is no appreciable edema or consolidation. Heart is borderline prominent with pulmonary vascularity within normal limits. There is atherosclerotic change in the aortic arch region. No adenopathy. No bone lesions.  IMPRESSION: No edema or consolidation.   Electronically Signed   By: Lowella Grip III M.D.   On: 08/27/2014 14:27    Microbiology: No results found for this or any previous visit (from the past 240 hour(s)).   Labs: Basic Metabolic Panel:  Recent Labs Lab 08/27/14 1400 08/27/14 1800 08/28/14 0613  NA 138 141 139  K 4.1 3.8 3.8  CL 100* 102 105  CO2 25 27 26   GLUCOSE 426* 332* 197*  BUN 29* 31* 28*  CREATININE 1.67* 1.67* 1.37*  CALCIUM 9.2 9.4 9.0  MG  --  1.7  --   PHOS  --  3.6  --    Liver Function Tests:  Recent Labs Lab 08/27/14 1400 08/27/14 1800 08/28/14 0613  AST 28 26 25   ALT 19 19 18   ALKPHOS 90 96 85  BILITOT 0.9 0.5 0.7  PROT 6.8 7.1 6.7  ALBUMIN 3.7 3.7 3.5   No results  for input(s): LIPASE, AMYLASE in the last 168 hours. No results for input(s): AMMONIA in the last 168 hours. CBC:  Recent Labs Lab 08/27/14 1400 08/27/14 1800 08/28/14 0613  WBC 7.9 10.3 8.7  NEUTROABS 5.9 8.1*  --   HGB 9.3* 9.7* 9.1*  HCT 31.7* 31.9* 30.3*  MCV 83.6 82.9 83.0  PLT 237 266 285   Cardiac Enzymes:  Recent Labs Lab 08/27/14 1400  TROPONINI <0.03   BNP: BNP (last 3 results) No results for input(s): BNP in the last 8760 hours.  ProBNP (last 3 results)  Recent Labs  10/03/13 1400 07/27/14 1548  PROBNP 155.7* 866.0*    CBG:  Recent Labs Lab 08/27/14 1742 08/27/14 2136 08/28/14 0852  GLUCAP 297* 424* 254*

## 2014-08-28 NOTE — Progress Notes (Signed)
Patient went to bathroom with NT , got extremely agitated while in bathroom, praying and praising and speaking in tongues, patient assisted back to bed , and tried to calm patient down, husband at bedside.an hour later patient did same thing again,DR Charlies Silvers was made aware and ordered ativan and haldol prn, discharge on hold.

## 2014-08-28 NOTE — Progress Notes (Signed)
OT Cancellation Note  Patient Details Name: [name] MRN: [id number] DOB: 02/27/1940   Cancelled Treatment:    Reason Eval/Treat Not Completed: Other (comment),  Pt is discharging today.  Spoke to daughter and she will have 24/7 at home between her daughter and husband.  She was ambulating with IV pole to bathroom when I arrived--supervision level.  Daughter does not feel she has OT/PT needs.  They are going to look into cognition further to r/u dementia.  Aalijah Lanphere 08/28/2014, 1:59 PM  Lesle Chris, OTR/L [phone] 08/28/2014

## 2014-08-28 NOTE — Progress Notes (Signed)
BLOOD GLUCOSE 450 MD PAGED TO INFORM/NOTIFY. WILL DRAW GLUCOSE VERFICATION PER POLICY

## 2014-08-28 NOTE — Progress Notes (Signed)
PT Cancellation Note  Patient Details Name: MARGURITE MCNICHOL MRN: [id number] DOB: 01/07/1941   Cancelled Treatment:    Reason Eval/Treat Not Completed: Other (comment) (OT spoke with pt's daughter who stated pt doesn't have PT/OT needs. Pt was walking with IV pole in room.  PT signing off. )   Philomena Doheny 08/28/2014, 2:55 PM  [phone]

## 2014-08-28 NOTE — Progress Notes (Signed)
Pt's daughter would like to make follow up appt for scheduling/referral purposes will go over with D/C paperwork 1 wk follow up plan as ordered

## 2014-08-28 NOTE — Progress Notes (Signed)
Pt was initially ready for d/c but then CBG in 400 range. We gave novolog 10 units.  Then her mental status again worse with agitation. Added ativan / haldol to alternate for agitation as needed. Discharge placed on hold  Leisa Lenz Okay

## 2014-08-29 LAB — GLUCOSE, CAPILLARY: Glucose-Capillary: 230 mg/dL — ABNORMAL HIGH (ref 65–99)

## 2014-08-29 LAB — HEMOGLOBIN A1C
Hgb A1c MFr Bld: 7.6 % — ABNORMAL HIGH (ref 4.8–5.6)
Mean Plasma Glucose: 171 mg/dL

## 2014-08-29 LAB — URINE CULTURE

## 2014-08-29 NOTE — Care Management Note (Signed)
Case Management Note  Patient Details  Name: SANJNA PERRA MRN: [id number] Date of Birth: 08/22/1940  Subjective/Objective:                    Action/Plan:d/c home no needs or orders.   Expected Discharge Date:   (unknown)               Expected Discharge Plan:  Home/Self Care  In-House Referral:     Discharge planning Services  CM Consult  Post Acute Care Choice:    Choice offered to:     DME Arranged:    DME Agency:     HH Arranged:    [place] Agency:     Status of Service:  Completed, signed off  Medicare Important Message Given:    Date Medicare IM Given:    Medicare IM give by:    Date Additional Medicare IM Given:    Additional Medicare Important Message give by:     If discussed at [place] of Stay Meetings, dates discussed:    Additional Comments:  Dessa Phi, RN 08/29/2014, 10:47 AM

## 2014-08-29 NOTE — Progress Notes (Signed)
Discharge instructions reviewed with pt. No current questions or concerns. Pt is stable and will be leaving shortly. Port de-accessed

## 2014-08-29 NOTE — Progress Notes (Signed)
Pt seen and examined at bedside.  Pt was going to be discharged 7/19 but due to worsening mental status and CBG's in 400-500 range we held off on discharging her and have kept her for 24 hour observation. We obtained MRI of the brain which did not show acute intracranial findings although some demyelination was seen.  Pt looks better this am, she is alert and oriented to time, place and person. She wants to go home today and I think she is stable for discharge today.   At this point, patient has already received total of 3 doses of IV Rocephin for urinary tract infection. I spoke with the patient's daughter at the bedside and said that Aamber would not need antibody, on discharge. Urine culture on this admission was re-incubated for better growth.  As mentioned, patient is medically stable for discharge today. Please refer to discharge summary completed 08/28/2014. The only changes that we have discontinued Macrobid which we initially thought she will take at home on discharge but as mentioned she already received rocephin for 3 days.  Leisa Lenz [facility] [phone]

## 2014-09-01 LAB — CULTURE, BLOOD (ROUTINE X 2)
CULTURE: NO GROWTH
CULTURE: NO GROWTH

## 2014-09-04 ENCOUNTER — Inpatient Hospital Stay (HOSPITAL_COMMUNITY)
Admission: EM | Admit: 2014-09-04 | Discharge: 2014-09-07 | DRG: 378 | Disposition: A | Discharge status: Home / Self Care | Payer: Medicare Other | Attending: Internal Medicine | Admitting: Internal Medicine

## 2014-09-04 ENCOUNTER — Encounter (HOSPITAL_COMMUNITY): Payer: Self-pay | Admitting: Emergency Medicine

## 2014-09-04 DIAGNOSIS — I780 Hereditary hemorrhagic telangiectasia: Secondary | ICD-10-CM | POA: Diagnosis not present

## 2014-09-04 DIAGNOSIS — Z886 Allergy status to analgesic agent status: Secondary | ICD-10-CM

## 2014-09-04 DIAGNOSIS — E11649 Type 2 diabetes mellitus with hypoglycemia without coma: Secondary | ICD-10-CM | POA: Diagnosis not present

## 2014-09-04 DIAGNOSIS — Z794 Long term (current) use of insulin: Secondary | ICD-10-CM

## 2014-09-04 DIAGNOSIS — N179 Acute kidney failure, unspecified: Secondary | ICD-10-CM | POA: Diagnosis present

## 2014-09-04 DIAGNOSIS — E1122 Type 2 diabetes mellitus with diabetic chronic kidney disease: Secondary | ICD-10-CM | POA: Diagnosis present

## 2014-09-04 DIAGNOSIS — E785 Hyperlipidemia, unspecified: Secondary | ICD-10-CM | POA: Diagnosis not present

## 2014-09-04 DIAGNOSIS — Z87891 Personal history of nicotine dependence: Secondary | ICD-10-CM | POA: Diagnosis not present

## 2014-09-04 DIAGNOSIS — E1165 Type 2 diabetes mellitus with hyperglycemia: Secondary | ICD-10-CM | POA: Diagnosis present

## 2014-09-04 DIAGNOSIS — N183 Chronic kidney disease, stage 3 (moderate): Secondary | ICD-10-CM | POA: Diagnosis present

## 2014-09-04 DIAGNOSIS — D500 Iron deficiency anemia secondary to blood loss (chronic): Secondary | ICD-10-CM

## 2014-09-04 DIAGNOSIS — E119 Type 2 diabetes mellitus without complications: Secondary | ICD-10-CM | POA: Diagnosis present

## 2014-09-04 DIAGNOSIS — K219 Gastro-esophageal reflux disease without esophagitis: Secondary | ICD-10-CM | POA: Diagnosis not present

## 2014-09-04 DIAGNOSIS — Q2733 Arteriovenous malformation of digestive system vessel: Secondary | ICD-10-CM

## 2014-09-04 DIAGNOSIS — K922 Gastrointestinal hemorrhage, unspecified: Principal | ICD-10-CM | POA: Diagnosis present

## 2014-09-04 DIAGNOSIS — I5032 Chronic diastolic (congestive) heart failure: Secondary | ICD-10-CM | POA: Diagnosis present

## 2014-09-04 DIAGNOSIS — I129 Hypertensive chronic kidney disease with stage 1 through stage 4 chronic kidney disease, or unspecified chronic kidney disease: Secondary | ICD-10-CM | POA: Diagnosis not present

## 2014-09-04 DIAGNOSIS — R262 Difficulty in walking, not elsewhere classified: Secondary | ICD-10-CM | POA: Diagnosis not present

## 2014-09-04 DIAGNOSIS — D649 Anemia, unspecified: Secondary | ICD-10-CM

## 2014-09-04 DIAGNOSIS — D62 Acute posthemorrhagic anemia: Secondary | ICD-10-CM | POA: Diagnosis not present

## 2014-09-04 DIAGNOSIS — D509 Iron deficiency anemia, unspecified: Secondary | ICD-10-CM | POA: Diagnosis not present

## 2014-09-04 DIAGNOSIS — N390 Urinary tract infection, site not specified: Secondary | ICD-10-CM | POA: Diagnosis present

## 2014-09-04 DIAGNOSIS — R531 Weakness: Secondary | ICD-10-CM | POA: Diagnosis present

## 2014-09-04 DIAGNOSIS — E1129 Type 2 diabetes mellitus with other diabetic kidney complication: Secondary | ICD-10-CM | POA: Diagnosis not present

## 2014-09-04 DIAGNOSIS — K31811 Angiodysplasia of stomach and duodenum with bleeding: Secondary | ICD-10-CM | POA: Diagnosis not present

## 2014-09-04 LAB — URINALYSIS, ROUTINE W REFLEX MICROSCOPIC
BILIRUBIN URINE: NEGATIVE
Glucose, UA: NEGATIVE mg/dL
HGB URINE DIPSTICK: NEGATIVE
Ketones, ur: NEGATIVE mg/dL
Leukocytes, UA: NEGATIVE
NITRITE: NEGATIVE
Protein, ur: NEGATIVE mg/dL
Specific Gravity, Urine: 1.007 (ref 1.005–1.030)
UROBILINOGEN UA: 0.2 mg/dL (ref 0.0–1.0)
pH: 6.5 (ref 5.0–8.0)

## 2014-09-04 LAB — PREPARE RBC (CROSSMATCH)

## 2014-09-04 LAB — CBC
HEMATOCRIT: 21.5 % — AB (ref 36.0–46.0)
Hemoglobin: 6.5 g/dL — CL (ref 12.0–15.0)
MCH: 24.2 pg — AB (ref 26.0–34.0)
MCHC: 30.2 g/dL (ref 30.0–36.0)
MCV: 79.9 fL (ref 78.0–100.0)
Platelets: 328 10*3/uL (ref 150–400)
RBC: 2.69 MIL/uL — ABNORMAL LOW (ref 3.87–5.11)
RDW: 18.2 % — AB (ref 11.5–15.5)
WBC: 8.4 10*3/uL (ref 4.0–10.5)

## 2014-09-04 LAB — PROTIME-INR
INR: 1.14 (ref 0.00–1.49)
Prothrombin Time: 14.8 seconds (ref 11.6–15.2)

## 2014-09-04 LAB — BASIC METABOLIC PANEL
Anion gap: 10 (ref 5–15)
BUN: 33 mg/dL — ABNORMAL HIGH (ref 6–20)
CALCIUM: 9 mg/dL (ref 8.9–10.3)
CO2: 27 mmol/L (ref 22–32)
CREATININE: 1.29 mg/dL — AB (ref 0.44–1.00)
Chloride: 99 mmol/L — ABNORMAL LOW (ref 101–111)
GFR calc non Af Amer: 40 mL/min — ABNORMAL LOW (ref >60)
GFR, EST AFRICAN AMERICAN: 46 mL/min — AB (ref >60)
Glucose, Bld: 411 mg/dL — ABNORMAL HIGH (ref 65–99)
Potassium: 3.8 mmol/L (ref 3.5–5.1)
SODIUM: 136 mmol/L (ref 135–145)

## 2014-09-04 LAB — APTT: APTT: 27 s (ref 24–37)

## 2014-09-04 LAB — CBG MONITORING, ED: GLUCOSE-CAPILLARY: 386 mg/dL — AB (ref 65–99)

## 2014-09-04 LAB — GLUCOSE, CAPILLARY: GLUCOSE-CAPILLARY: 273 mg/dL — AB (ref 65–99)

## 2014-09-04 LAB — POC OCCULT BLOOD, ED: FECAL OCCULT BLD: POSITIVE — AB

## 2014-09-04 MED ORDER — VITAMIN B-1 100 MG PO TABS
100.0000 mg | ORAL_TABLET | Freq: Every day | ORAL | Status: DC
  Administered 2014-09-05 – 2014-09-07 (×3): 100 mg via ORAL
  Filled 2014-09-04 (×3): qty 1

## 2014-09-04 MED ORDER — AMLODIPINE BESYLATE 5 MG PO TABS
5.0000 mg | ORAL_TABLET | Freq: Every day | ORAL | Status: DC
  Administered 2014-09-04 – 2014-09-07 (×4): 5 mg via ORAL
  Filled 2014-09-04 (×4): qty 1

## 2014-09-04 MED ORDER — PANTOPRAZOLE SODIUM 40 MG IV SOLR
40.0000 mg | Freq: Two times a day (BID) | INTRAVENOUS | Status: DC
  Administered 2014-09-04 – 2014-09-06 (×4): 40 mg via INTRAVENOUS
  Filled 2014-09-04 (×4): qty 40

## 2014-09-04 MED ORDER — ADULT MULTIVITAMIN W/MINERALS CH
1.0000 | ORAL_TABLET | Freq: Every day | ORAL | Status: DC
  Administered 2014-09-05 – 2014-09-07 (×3): 1 via ORAL
  Filled 2014-09-04 (×3): qty 1

## 2014-09-04 MED ORDER — ACETAMINOPHEN 650 MG RE SUPP: 650.0000 mg | Freq: Four times a day (QID) | RECTAL | Status: DC | PRN

## 2014-09-04 MED ORDER — FERROUS FUMARATE 325 (106 FE) MG PO TABS
1.0000 | ORAL_TABLET | Freq: Every day | ORAL | Status: DC
  Administered 2014-09-05 – 2014-09-07 (×3): 106 mg via ORAL
  Filled 2014-09-04 (×5): qty 1

## 2014-09-04 MED ORDER — CYCLOBENZAPRINE HCL 5 MG PO TABS: 5.0000 mg | ORAL_TABLET | Freq: Three times a day (TID) | ORAL | Status: DC | PRN

## 2014-09-04 MED ORDER — VITAMIN B-12 1000 MCG PO TABS
1000.0000 ug | ORAL_TABLET | Freq: Every day | ORAL | Status: DC
  Administered 2014-09-04 – 2014-09-06 (×3): 1000 ug via ORAL
  Filled 2014-09-04 (×3): qty 1

## 2014-09-04 MED ORDER — FOLIC ACID 1 MG PO TABS
1.0000 mg | ORAL_TABLET | Freq: Every day | ORAL | Status: DC
  Administered 2014-09-04 – 2014-09-07 (×4): 1 mg via ORAL
  Filled 2014-09-04 (×4): qty 1

## 2014-09-04 MED ORDER — ACETAMINOPHEN 325 MG PO TABS: 650.0000 mg | ORAL_TABLET | Freq: Four times a day (QID) | ORAL | Status: DC | PRN

## 2014-09-04 MED ORDER — ONDANSETRON HCL 4 MG/2ML IJ SOLN: 4.0000 mg | Freq: Four times a day (QID) | INTRAMUSCULAR | Status: DC | PRN

## 2014-09-04 MED ORDER — FUROSEMIDE 10 MG/ML IJ SOLN
20.0000 mg | Freq: Once | INTRAMUSCULAR | Status: AC
  Administered 2014-09-04: 20 mg via INTRAVENOUS
  Filled 2014-09-04: qty 2

## 2014-09-04 MED ORDER — SODIUM CHLORIDE 0.9 % IV BOLUS (SEPSIS)
500.0000 mL | Freq: Once | INTRAVENOUS | Status: AC
  Administered 2014-09-04: 500 mL via INTRAVENOUS

## 2014-09-04 MED ORDER — FUROSEMIDE 40 MG PO TABS: 40.0000 mg | ORAL_TABLET | Freq: Every day | ORAL | Status: DC

## 2014-09-04 MED ORDER — SODIUM CHLORIDE 0.9 % IV SOLN
Freq: Once | INTRAVENOUS | Status: AC
  Administered 2014-09-04: 22:00:00 via INTRAVENOUS

## 2014-09-04 MED ORDER — SODIUM CHLORIDE 0.9 % IV SOLN: 10.0000 mL/h | Freq: Once | INTRAVENOUS | Status: DC

## 2014-09-04 MED ORDER — INSULIN DETEMIR 100 UNIT/ML ~~LOC~~ SOLN
30.0000 [IU] | Freq: Every day | SUBCUTANEOUS | Status: DC
  Administered 2014-09-04: 30 [IU] via SUBCUTANEOUS
  Filled 2014-09-04 (×3): qty 0.3

## 2014-09-04 MED ORDER — METOPROLOL SUCCINATE ER 25 MG PO TB24
25.0000 mg | ORAL_TABLET | Freq: Every day | ORAL | Status: DC
  Administered 2014-09-04 – 2014-09-07 (×3): 25 mg via ORAL
  Filled 2014-09-04 (×3): qty 1

## 2014-09-04 MED ORDER — INSULIN DETEMIR 100 UNIT/ML ~~LOC~~ SOLN
40.0000 [IU] | Freq: Every day | SUBCUTANEOUS | Status: DC
  Filled 2014-09-04: qty 0.4

## 2014-09-04 MED ORDER — ONDANSETRON HCL 4 MG PO TABS: 4.0000 mg | ORAL_TABLET | Freq: Four times a day (QID) | ORAL | Status: DC | PRN

## 2014-09-04 MED ORDER — INSULIN ASPART 100 UNIT/ML ~~LOC~~ SOLN
0.0000 [IU] | SUBCUTANEOUS | Status: DC
  Administered 2014-09-04: 8 [IU] via SUBCUTANEOUS
  Administered 2014-09-05: 15 [IU] via SUBCUTANEOUS
  Administered 2014-09-06: 3 [IU] via SUBCUTANEOUS
  Administered 2014-09-06: 2 [IU] via SUBCUTANEOUS
  Administered 2014-09-06: 3 [IU] via SUBCUTANEOUS
  Administered 2014-09-06 (×2): 5 [IU] via SUBCUTANEOUS
  Administered 2014-09-06: 8 [IU] via SUBCUTANEOUS
  Administered 2014-09-07: 5 [IU] via SUBCUTANEOUS

## 2014-09-04 MED ORDER — SODIUM CHLORIDE 0.9 % IV SOLN
INTRAVENOUS | Status: DC
  Administered 2014-09-04: 18:00:00 via INTRAVENOUS

## 2014-09-04 MED ORDER — SODIUM CHLORIDE 0.9 % IJ SOLN
3.0000 mL | Freq: Two times a day (BID) | INTRAMUSCULAR | Status: DC
  Administered 2014-09-04 – 2014-09-06 (×6): 3 mL via INTRAVENOUS

## 2014-09-04 MED ORDER — LUBIPROSTONE 24 MCG PO CAPS
24.0000 ug | ORAL_CAPSULE | Freq: Two times a day (BID) | ORAL | Status: DC
  Administered 2014-09-05 – 2014-09-07 (×5): 24 ug via ORAL
  Filled 2014-09-04 (×6): qty 1

## 2014-09-04 MED ORDER — VITAMIN D (ERGOCALCIFEROL) 1.25 MG (50000 UNIT) PO CAPS: 50000.0000 [IU] | ORAL_CAPSULE | ORAL | Status: DC

## 2014-09-04 MED ORDER — SODIUM CHLORIDE 0.9 % IV SOLN
INTRAVENOUS | Status: DC
  Administered 2014-09-04 – 2014-09-06 (×3): via INTRAVENOUS

## 2014-09-04 NOTE — ED Notes (Signed)
Pt reports decreased appetite since yesterday. CBG elevated in triage. Hx of diabetes. Denies n/v/d.

## 2014-09-04 NOTE — ED Notes (Signed)
Nurse is in room starting a IV

## 2014-09-04 NOTE — H&P (Signed)
[facility] History and Physical  [name] [id number] DOB: 1940/07/27 DOA: 09/04/2014  Referring physician: Quincy Carnes, PA PCP: Philis Fendt, MD   Chief Complaint: Weakness  HPI: [name] is a 74 y.o. female with prior history of DM II GI bleed CKD III presents with weakness. She states taht she has had a poor appetite since this morning. She states taht she has been feeling weak fopr the last week. She staes that she has been having difficulty ambulating. She has had no chest pain. She states that she has been feeling wobbly on her feet. She has not had any syncope. She has had some pain in her stomach across her abdomen. She states her stool has been black but she also does take iron supplementation. She has had nausea and has had some vomiting. She states that she has had food in her vomit no coffee ground.    Review of Systems:  Constitutional:  No weight loss, night sweats, Fevers, chills, +fatigue.  HEENT:  No headaches, itching, ear ache, nasal congestion, post nasal drip,  Cardio-vascular:  No chest pain, Orthopnea, PND, dizziness, palpitations  GI:  No heartburn, indigestion, +abdominal pain, +nausea, +vomiting, +loss of appetite  Resp:  +shortness of breath at rest. No coughing up of blood  Skin:  no rash or lesions GU:  no dysuria, change in color of urine Musculoskeletal:  No joint pain or swelling Psych:  No change in mood or affect  Past Medical History  Diagnosis Date  . Anemia   . S/P endoscopy     with laser treatment  08/19/2006  . History of epistaxis   . Hyperlipidemia   . Telangiectasia     Gastric   . Lichen planus     Both lower extremities  . GERD (gastroesophageal reflux disease)   . Heart murmur   . CHF (congestive heart failure)   . Pneumonia 1990's    "twice" (09/15/2012)  . Type II diabetes mellitus     insulin requiring.  Marland Kitchen History of blood transfusion     "getting 2 today; have had transfusions before"  (09/15/2012)  . Family history of anesthesia complication     "niece has a hard time coming out" (09/15/2012)  . Chronic diastolic CHF (congestive heart failure) 10/03/2013  . HTN (hypertension), benign 03/02/2012   Past Surgical History  Procedure Laterality Date  . Nose surgery      "for bleeding" (09/15/2012)  . Savory dilation  02/26/2011    Procedure: SAVORY DILATION;  Surgeon: [name], MD;  Location: [name];  Service: Endoscopy;  Laterality: N/A;  c-arm needed  . Esophagogastroduodenoscopy  02/26/2011    Procedure: ESOPHAGOGASTRODUODENOSCOPY (EGD);  Surgeon: [name], MD;  Location: [name];  Service: Endoscopy;  Laterality: N/A;  . Esophagogastroduodenoscopy N/A 11/08/2012    Procedure: ESOPHAGOGASTRODUODENOSCOPY (EGD);  Surgeon: [name], MD;  Location: [name];  Service: Endoscopy;  Laterality: N/A;  . Cataract surgery    . Esophagogastroduodenoscopy N/A 10/04/2013    Procedure: ESOPHAGOGASTRODUODENOSCOPY (EGD);  Surgeon: Winfield Cunas., MD;  Location: [name];  Service: Endoscopy;  Laterality: N/A;  with APC on stand-by  . Esophagogastroduodenoscopy N/A 07/06/2014    Procedure: ESOPHAGOGASTRODUODENOSCOPY (EGD);  Surgeon: Clarene Essex, MD;  Location: [name];  Service: Endoscopy;  Laterality: N/A;  . Hot hemostasis N/A 07/06/2014    Procedure: HOT HEMOSTASIS (ARGON PLASMA COAGULATION/BICAP);  Surgeon: Clarene Essex, MD;  Location: [name];  Service: Endoscopy;  Laterality: N/A;  Social History:  reports that she quit smoking about 43 years ago. Her smoking use included Cigarettes. She quit after 2 years of use. She has never used smokeless tobacco. She reports that she does not drink alcohol or use illicit drugs.  Allergies  Allergen Reactions  . Aspirin Nausea And Vomiting    Family History  Problem Relation Age of Onset  . Breast cancer    . Malignant hyperthermia Neg Hx   . Stroke Father      Prior to Admission medications    Medication Sig Start Date End Date Taking? Authorizing Provider  AMITIZA 24 MCG capsule Take 24 mcg by mouth 2 (two) times daily. 08/29/14  Yes Historical Provider, MD  amLODipine (NORVASC) 5 MG tablet Take 1 tablet (5 mg total) by mouth daily. 08/10/14  Yes [name], MD  cyclobenzaprine (FLEXERIL) 5 MG tablet Take 1 tablet (5 mg total) by mouth 3 (three) times daily as needed for muscle spasms. 08/28/14  Yes Robbie Lis, MD  ferrous fumarate (HEMOCYTE - 106 MG FE) 325 (106 FE) MG TABS tablet Take 1 tablet by mouth daily.   Yes Historical Provider, MD  furosemide (LASIX) 40 MG tablet Take 40mg  twice daily for two days and then start taking 40mg  once daily. Patient taking differently: Take 40 mg by mouth daily.  07/30/14  Yes [name], MD  insulin detemir (LEVEMIR) 100 UNIT/ML injection Inject 0.3 mLs (30 Units total) into the skin at bedtime. Patient taking differently: Inject 30-40 Units into the skin 2 (two) times daily. 40 units in the Morning, and 30 units at Bedtime 10/05/13  Yes Robbie Lis, MD  oxymetazoline (AFRIN) 0.05 % nasal spray Place 1 spray into both nostrils 2 (two) times daily as needed (Epistaxis). 10/05/13  Yes Robbie Lis, MD  pantoprazole (PROTONIX) 40 MG tablet Take 1 tablet (40 mg total) by mouth 2 (two) times daily. 07/07/14  Yes Hosie Poisson, MD  vitamin B-12 (CYANOCOBALAMIN) 1000 MCG tablet Take 1,000 mcg by mouth at bedtime.    Yes Historical Provider, MD  Vitamin D, Ergocalciferol, (DRISDOL) 50000 UNITS CAPS Take 50,000 Units by mouth every 7 (seven) days. Takes on Monday   Yes Historical Provider, MD  metoprolol succinate (TOPROL XL) 25 MG 24 hr tablet Take 1 tablet (25 mg total) by mouth daily. Patient not taking: Reported on 08/27/2014 07/27/14   [name], MD   Physical Exam: Filed Vitals:   09/04/14 1606 09/04/14 1633 09/04/14 1658 09/04/14 1722  BP: 113/36 114/47 138/52 121/38  Pulse: 90  88 85  Temp: 98.3 F (36.8 C)  98.6 F (37 C)   TempSrc:  Oral  Oral   Resp: 18  16   SpO2: 97%  99% 98%    Wt Readings from Last 3 Encounters:  08/28/14 61.689 kg (136 lb)  08/10/14 68.947 kg (152 lb)  07/27/14 70.035 kg (154 lb 6.4 oz)    General:  Appears calm and comfortable Eyes: PERRL, normal lids, irises & conjunctiva ENT: grossly normal hearing, lips & tongue Neck: no LAD, masses or thyromegaly Cardiovascular: RRR, no m/r/g. No LE edema Respiratory: CTA bilaterally, no w/r/r. Normal respiratory effort. Abdomen: soft, c/o LQ pain no rebound noted.  Skin: no rash or induration seen on limited exam Musculoskeletal: grossly normal tone BUE/BLE Psychiatric: grossly normal mood and affect Neurologic: grossly non-focal.          Labs on Admission:  Basic Metabolic Panel:  Recent Labs Lab 09/04/14  1634  NA 136  K 3.8  CL 99*  CO2 27  GLUCOSE 411*  BUN 33*  CREATININE 1.29*  CALCIUM 9.0   Liver Function Tests: No results for input(s): AST, ALT, ALKPHOS, BILITOT, PROT, ALBUMIN in the last 168 hours. No results for input(s): LIPASE, AMYLASE in the last 168 hours. No results for input(s): AMMONIA in the last 168 hours. CBC:  Recent Labs Lab 09/04/14 1634  WBC 8.4  HGB 6.5*  HCT 21.5*  MCV 79.9  PLT 328   Cardiac Enzymes: No results for input(s): CKTOTAL, CKMB, CKMBINDEX, TROPONINI in the last 168 hours.  BNP (last 3 results) No results for input(s): BNP in the last 8760 hours.  ProBNP (last 3 results)  Recent Labs  10/03/13 1400 07/27/14 1548  PROBNP 155.7* 866.0*    CBG:  Recent Labs Lab 08/28/14 2136 08/29/14 0738 09/04/14 1612  GLUCAP 284* 230* 386*    Radiological Exams on Admission: No results found.    Assessment/Plan Principal Problem:   DM (diabetes mellitus), type 2, uncontrolled, with renal complications Active Problems:   CKD (chronic kidney disease) stage 3, GFR 30-59 ml/min   Anemia   1. Anemia due to acute blood loss -will transfuse a total of 2 units PRBC -will  continue with iron supplementation -GI will see patient in am -keep NPO overnight for possible colonoscopy/EGD -will continue with protonix  2. DM II uncontrolled with kidney complications -will start on SSI FSBS coverage -continue with levimer as per home regimen -will check A1C  3. CKD III secondary to DM -creatinine appears to be at baseline -will monitor labs  4. HTN -will continue with norvasc lopressor and lasix -monitor pressures  5. UTI recently admitted for this -UA today looks cloudy but otherwise unremarkable -family reports she has completed antibiotics   Code Status: Full Code (must indicate code status--if unknown or must be presumed, indicate so) DVT Prophylaxis:SCD Family Communication: none (indicate person spoken with, if applicable, with phone number if by telephone) Disposition Plan: home (indicate anticipated LOS)  Time spent: 48min  Byren Pankow A [facility] Pager [phone]

## 2014-09-04 NOTE — ED Provider Notes (Signed)
CSN: [id number]     Arrival date & time 09/04/14  1555 History   First MD Initiated Contact with Patient 09/04/14 1655     Chief Complaint  Patient presents with  . Hyperglycemia     (Consider location/radiation/quality/duration/timing/severity/associated sxs/prior Treatment) Patient is a 74 y.o. female presenting with hyperglycemia. The history is provided by the patient and medical records.  Hyperglycemia Associated symptoms: nausea and weakness    74 year old female with history of chronic anemia, hyperlipidemia, gastric telangiectasias, GERD, congestive heart failure, diabetes, hypertension, presenting to the ED for generalized weakness.  Patient states this is been ongoing since being admitted to the hospital 1 week ago.  States at time of discharge she felt better than on admission, however she did not feel that she was back to her baseline. She reports decreased appetite and limited solid food intake.  She has been drinking water and cranberry juice regularly. She admits to some nausea and she did vomit twice today which was clear liquid. She denies any current abdominal pain. No chest pain or shortness of breath. Patient has chronic anemia, she sees cancer center for routine blood checks.  She has required blood transfusions and iron infusions in the past.   Her stool has been black in color recently. Patient does take iron.  Has had issues with GI bleeding in the past.  No current anticoagulation.  Patient followed by GI, Dr. [name] Plenty.  VSS.   Past Medical History  Diagnosis Date  . Anemia   . S/P endoscopy     with laser treatment  08/19/2006  . History of epistaxis   . Hyperlipidemia   . Telangiectasia     Gastric   . Lichen planus     Both lower extremities  . GERD (gastroesophageal reflux disease)   . Heart murmur   . CHF (congestive heart failure)   . Pneumonia 1990's    "twice" (09/15/2012)  . Type II diabetes mellitus     insulin requiring.  Marland Kitchen History of blood transfusion      "getting 2 today; have had transfusions before" (09/15/2012)  . Family history of anesthesia complication     "niece has a hard time coming out" (09/15/2012)  . Chronic diastolic CHF (congestive heart failure) 10/03/2013  . HTN (hypertension), benign 03/02/2012   Past Surgical History  Procedure Laterality Date  . Nose surgery      "for bleeding" (09/15/2012)  . Savory dilation  02/26/2011    Procedure: SAVORY DILATION;  Surgeon: [name], MD;  Location: [name];  Service: Endoscopy;  Laterality: N/A;  c-arm needed  . Esophagogastroduodenoscopy  02/26/2011    Procedure: ESOPHAGOGASTRODUODENOSCOPY (EGD);  Surgeon: [name], MD;  Location: [name];  Service: Endoscopy;  Laterality: N/A;  . Esophagogastroduodenoscopy N/A 11/08/2012    Procedure: ESOPHAGOGASTRODUODENOSCOPY (EGD);  Surgeon: [name], MD;  Location: [name];  Service: Endoscopy;  Laterality: N/A;  . Cataract surgery    . Esophagogastroduodenoscopy N/A 10/04/2013    Procedure: ESOPHAGOGASTRODUODENOSCOPY (EGD);  Surgeon: Winfield Cunas., MD;  Location: [name];  Service: Endoscopy;  Laterality: N/A;  with APC on stand-by  . Esophagogastroduodenoscopy N/A 07/06/2014    Procedure: ESOPHAGOGASTRODUODENOSCOPY (EGD);  Surgeon: Clarene Essex, MD;  Location: [name];  Service: Endoscopy;  Laterality: N/A;  . Hot hemostasis N/A 07/06/2014    Procedure: HOT HEMOSTASIS (ARGON PLASMA COAGULATION/BICAP);  Surgeon: Clarene Essex, MD;  Location: [name];  Service: Endoscopy;  Laterality: N/A;   Family History  Problem  Relation Age of Onset  . Breast cancer    . Malignant hyperthermia Neg Hx   . Stroke Father    History  Substance Use Topics  . Smoking status: Former Smoker -- 2 years    Types: Cigarettes    Quit date: 02/10/1971  . Smokeless tobacco: Never Used     Comment: 09/15/2012 "smoked 50-60 yr ago"  . Alcohol Use: No   OB History    No data available     Review of Systems   Gastrointestinal: Positive for nausea.  Neurological: Positive for weakness.  All other systems reviewed and are negative.     Allergies  Aspirin  Home Medications   Prior to Admission medications   Medication Sig Start Date End Date Taking? Authorizing Provider  amLODipine (NORVASC) 5 MG tablet Take 1 tablet (5 mg total) by mouth daily. 08/10/14  Yes [name], MD  cyclobenzaprine (FLEXERIL) 5 MG tablet Take 1 tablet (5 mg total) by mouth 3 (three) times daily as needed for muscle spasms. 08/28/14   Robbie Lis, MD  furosemide (LASIX) 40 MG tablet Take 40mg  twice daily for two days and then start taking 40mg  once daily. 07/30/14   [name], MD  insulin detemir (LEVEMIR) 100 UNIT/ML injection Inject 0.3 mLs (30 Units total) into the skin at bedtime. Patient taking differently: Inject 30-40 Units into the skin 2 (two) times daily. 40 units in the Morning, and 30 units at Bedtime 10/05/13   Robbie Lis, MD  metoprolol succinate (TOPROL XL) 25 MG 24 hr tablet Take 1 tablet (25 mg total) by mouth daily. Patient not taking: Reported on 08/27/2014 07/27/14   [name], MD  oxymetazoline (AFRIN) 0.05 % nasal spray Place 1 spray into both nostrils 2 (two) times daily as needed (Epistaxis). 10/05/13   Robbie Lis, MD  pantoprazole (PROTONIX) 40 MG tablet Take 1 tablet (40 mg total) by mouth 2 (two) times daily. 07/07/14   Hosie Poisson, MD  vitamin B-12 (CYANOCOBALAMIN) 1000 MCG tablet Take 1,000 mcg by mouth at bedtime.     Historical Provider, MD  Vitamin D, Ergocalciferol, (DRISDOL) 50000 UNITS CAPS Take 50,000 Units by mouth every 7 (seven) days. Takes on Monday    Historical Provider, MD   BP 138/52 mmHg  Pulse 88  Temp(Src) 98.6 F (37 C) (Oral)  Resp 16  SpO2 99%   Physical Exam  Constitutional: She is oriented to person, place, and time. She appears well-developed and well-nourished.  HENT:  Head: Normocephalic and atraumatic.  Mouth/Throat: Oropharynx is clear and  moist.  Eyes: Conjunctivae and EOM are normal. Pupils are equal, round, and reactive to light.  Conjunctiva pale  Neck: Normal range of motion.  Cardiovascular: Normal rate, regular rhythm and normal heart sounds.   Pulmonary/Chest: Effort normal and breath sounds normal. No respiratory distress. She has no wheezes.  Abdominal: Soft. Bowel sounds are normal.  Genitourinary: Rectal exam shows anal tone normal. Guaiac positive stool.  Black stool noted on exam, no bright red blood noted, guaiac +  Musculoskeletal: Normal range of motion.  Neurological: She is alert and oriented to person, place, and time.  Skin: Skin is warm and dry.  Psychiatric: She has a normal mood and affect.  Nursing note and vitals reviewed.   ED Course  Procedures (including critical care time)  CRITICAL CARE Performed by: Larene Pickett   Total critical care time: 40  Critical care time was exclusive of separately billable  procedures and treating other patients.  Critical care was necessary to treat or prevent imminent or life-threatening deterioration.  Critical care was time spent personally by me on the following activities: development of treatment plan with patient and/or surrogate as well as nursing, discussions with consultants, evaluation of patient's response to treatment, examination of patient, obtaining history from patient or surrogate, ordering and performing treatments and interventions, ordering and review of laboratory studies, ordering and review of radiographic studies, pulse oximetry and re-evaluation of patient's condition.  Medications  0.9 %  sodium chloride infusion (not administered)  0.9 %  sodium chloride infusion ( Intravenous New Bag/Given 09/04/14 1809)  sodium chloride 0.9 % bolus 500 mL (0 mLs Intravenous Stopped 09/04/14 1808)   Labs Review Labs Reviewed  BASIC METABOLIC PANEL - Abnormal; Notable for the following:    Chloride 99 (*)    Glucose, Bld 411 (*)    BUN 33 (*)     Creatinine, Ser 1.29 (*)    GFR calc non Af Amer 40 (*)    GFR calc Af Amer 46 (*)    All other components within normal limits  CBC - Abnormal; Notable for the following:    RBC 2.69 (*)    Hemoglobin 6.5 (*)    HCT 21.5 (*)    MCH 24.2 (*)    RDW 18.2 (*)    All other components within normal limits  CBG MONITORING, ED - Abnormal; Notable for the following:    Glucose-Capillary 386 (*)    All other components within normal limits  POC OCCULT BLOOD, ED - Abnormal; Notable for the following:    Fecal Occult Bld POSITIVE (*)    All other components within normal limits  URINALYSIS, ROUTINE W REFLEX MICROSCOPIC (NOT AT [facility])  PROTIME-INR  APTT  TYPE AND SCREEN  PREPARE RBC (CROSSMATCH)    Imaging Review No results found.   EKG Interpretation None      MDM   Final diagnoses:  Gastrointestinal hemorrhage, unspecified gastritis, unspecified gastrointestinal hemorrhage type  Anemia, unspecified anemia type   82 -year-old female here with generalized weakness.  Recent admission for AKI and AMS last week.  Patient afebrile, non-toxic.  VSS.  CBG elevated at 386.  Conjunctiva do appear pale, patient with hx of anemia.  Guaiac positive with black stool on exam.  Lab work with hcg of 6.5.  Patient does have history of gastric telangiectasias and GI bleeding in the past. She is willing to receive transfusion here.  Will order blood and transfuse.  Patient started on IVF for hyperglycemia.  Will consult with GI, however will need hospital admission for other ongoing medical issues.  5:39 PM Spoke with Dr. Penelope Coop-- will see patient in the morning.  Request patient to be kept NPO after midnight.  Patient admitted to hospitalist service, Dr. Humphrey Rolls.  Larene Pickett, PA-C 09/04/14 2129  Courteney Julio Alm, MD 09/04/14 [id number]

## 2014-09-04 NOTE — ED Notes (Signed)
Patient states she has no appetite and is nervous.  The nervousness started started about the pas two months and patient is not sure why.

## 2014-09-05 ENCOUNTER — Encounter (HOSPITAL_COMMUNITY): Payer: Self-pay

## 2014-09-05 ENCOUNTER — Encounter (HOSPITAL_COMMUNITY)
Admission: EM | Disposition: A | Discharge status: Home / Self Care | Payer: Self-pay | Source: Home / Self Care | Attending: Internal Medicine

## 2014-09-05 HISTORY — PX: ESOPHAGOGASTRODUODENOSCOPY: SHX5428

## 2014-09-05 LAB — COMPREHENSIVE METABOLIC PANEL
ALK PHOS: 77 U/L (ref 38–126)
ALT: 15 U/L (ref 14–54)
AST: 16 U/L (ref 15–41)
Albumin: 3.1 g/dL — ABNORMAL LOW (ref 3.5–5.0)
Anion gap: 10 (ref 5–15)
BILIRUBIN TOTAL: 1.4 mg/dL — AB (ref 0.3–1.2)
BUN: 24 mg/dL — ABNORMAL HIGH (ref 6–20)
CO2: 30 mmol/L (ref 22–32)
CREATININE: 1.03 mg/dL — AB (ref 0.44–1.00)
Calcium: 8.9 mg/dL (ref 8.9–10.3)
Chloride: 105 mmol/L (ref 101–111)
GFR calc non Af Amer: 52 mL/min — ABNORMAL LOW (ref >60)
Glucose, Bld: 73 mg/dL (ref 65–99)
POTASSIUM: 3.2 mmol/L — AB (ref 3.5–5.1)
Sodium: 145 mmol/L (ref 135–145)
TOTAL PROTEIN: 5.5 g/dL — AB (ref 6.5–8.1)

## 2014-09-05 LAB — GLUCOSE, CAPILLARY
GLUCOSE-CAPILLARY: 131 mg/dL — AB (ref 65–99)
GLUCOSE-CAPILLARY: 133 mg/dL — AB (ref 65–99)
GLUCOSE-CAPILLARY: 51 mg/dL — AB (ref 65–99)
GLUCOSE-CAPILLARY: 67 mg/dL (ref 65–99)
GLUCOSE-CAPILLARY: 68 mg/dL (ref 65–99)
GLUCOSE-CAPILLARY: 97 mg/dL (ref 65–99)
Glucose-Capillary: 122 mg/dL — ABNORMAL HIGH (ref 65–99)
Glucose-Capillary: 67 mg/dL (ref 65–99)
Glucose-Capillary: 84 mg/dL (ref 65–99)
Glucose-Capillary: 65 mg/dL (ref 65–99)
Glucose-Capillary: 110 mg/dL — ABNORMAL HIGH (ref 65–99)
Glucose-Capillary: 354 mg/dL — ABNORMAL HIGH (ref 65–99)

## 2014-09-05 LAB — PROTIME-INR
INR: 1.13 (ref 0.00–1.49)
PROTHROMBIN TIME: 14.7 s (ref 11.6–15.2)

## 2014-09-05 LAB — APTT: aPTT: 32 seconds (ref 24–37)

## 2014-09-05 LAB — TSH: TSH: 1.238 u[IU]/mL (ref 0.350–4.500)

## 2014-09-05 LAB — TYPE AND SCREEN
ABO/RH(D): A POS
ANTIBODY SCREEN: NEGATIVE
UNIT DIVISION: 0
Unit division: 0

## 2014-09-05 LAB — CBC
HCT: 26.2 % — ABNORMAL LOW (ref 36.0–46.0)
HEMOGLOBIN: 8.1 g/dL — AB (ref 12.0–15.0)
MCH: 24.9 pg — ABNORMAL LOW (ref 26.0–34.0)
MCHC: 30.9 g/dL (ref 30.0–36.0)
MCV: 80.6 fL (ref 78.0–100.0)
PLATELETS: 264 10*3/uL (ref 150–400)
RBC: 3.25 MIL/uL — ABNORMAL LOW (ref 3.87–5.11)
RDW: 16.1 % — AB (ref 11.5–15.5)
WBC: 6.3 10*3/uL (ref 4.0–10.5)

## 2014-09-05 LAB — OCCULT BLOOD X 1 CARD TO LAB, STOOL: Fecal Occult Bld: POSITIVE — AB

## 2014-09-05 SURGERY — EGD (ESOPHAGOGASTRODUODENOSCOPY)
Anesthesia: Moderate Sedation

## 2014-09-05 MED ORDER — SODIUM CHLORIDE 0.9 % IJ SOLN
10.0000 mL | INTRAMUSCULAR | Status: DC | PRN
  Administered 2014-09-05 – 2014-09-07 (×4): 10 mL
  Filled 2014-09-05 (×3): qty 40

## 2014-09-05 MED ORDER — DEXTROSE 50 % IV SOLN
25.0000 mL | Freq: Once | INTRAVENOUS | Status: AC
  Administered 2014-09-05: 25 mL via INTRAVENOUS
  Filled 2014-09-05: qty 50

## 2014-09-05 MED ORDER — MIDAZOLAM HCL 5 MG/ML IJ SOLN
INTRAMUSCULAR | Status: AC
  Filled 2014-09-05: qty 2

## 2014-09-05 MED ORDER — DIPHENHYDRAMINE HCL 50 MG/ML IJ SOLN
INTRAMUSCULAR | Status: AC
  Filled 2014-09-05: qty 1

## 2014-09-05 MED ORDER — FENTANYL CITRATE (PF) 100 MCG/2ML IJ SOLN
INTRAMUSCULAR | Status: DC | PRN
  Administered 2014-09-05 (×2): 25 ug via INTRAVENOUS
  Administered 2014-09-05 (×2): 12.5 ug via INTRAVENOUS

## 2014-09-05 MED ORDER — BUTAMBEN-TETRACAINE-BENZOCAINE 2-2-14 % EX AERO
INHALATION_SPRAY | CUTANEOUS | Status: DC | PRN
  Administered 2014-09-05: 2 via TOPICAL

## 2014-09-05 MED ORDER — MIDAZOLAM HCL 10 MG/2ML IJ SOLN
INTRAMUSCULAR | Status: DC | PRN
  Administered 2014-09-05 (×3): 1 mg via INTRAVENOUS
  Administered 2014-09-05: 2 mg via INTRAVENOUS

## 2014-09-05 MED ORDER — SODIUM CHLORIDE 0.9 % IJ SOLN
INTRAMUSCULAR | Status: DC | PRN
  Administered 2014-09-05: 1 mL

## 2014-09-05 MED ORDER — FENTANYL CITRATE (PF) 100 MCG/2ML IJ SOLN
INTRAMUSCULAR | Status: AC
  Filled 2014-09-05: qty 2

## 2014-09-05 MED ORDER — SODIUM CHLORIDE 0.9 % IV SOLN: INTRAVENOUS | Status: DC

## 2014-09-05 MED ORDER — DEXTROSE 50 % IV SOLN
INTRAVENOUS | Status: AC
  Administered 2014-09-05: 25 mL
  Filled 2014-09-05: qty 50

## 2014-09-05 MED ORDER — EPINEPHRINE HCL 0.1 MG/ML IJ SOSY
PREFILLED_SYRINGE | INTRAMUSCULAR | Status: AC
  Filled 2014-09-05: qty 10

## 2014-09-05 NOTE — Progress Notes (Signed)
Hypoglycemic Event  CBG: 67  Treatment: 15 GM carbohydrate snack  Symptoms: None  Follow-up CBG: Time:1800 CBG Result:122  Possible Reasons for Event: Inadequate meal intake  Comments/MD notified: eating her dinner    Darrow Bussing D  Remember to initiate Hypoglycemia Order Set & complete

## 2014-09-05 NOTE — Consult Note (Signed)
[facility] CONS[facility] Reason for consult: G.I. bleeding Referring Physician: [facility] hospitalist. PCP: Dr. Jeanie Cooks. Primary G.I.: Dr. Jeri Cos [name] is an 74 y.o. female.  HPI: she has a long history of intermittent G.I. 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 She has had several EGD's with treatment of AVMs with fulgaration with APC and endoscopic clipping. Her last colonoscopy was about 8 to 9 years ago and she has tentatively been set up with Dr. [name] Plenty for another colonoscopy in August. She is readmitted with passage of Hellenic stools for the past several weeks. Her last hemoglobin and Dr. [name] Plenty office a couple weeks ago was 8.9, had dropped 6.5 on admission. Denies NSAIDs, blood thinner's or abdominal pain.  Past Medical History  Diagnosis Date  . Anemia   . S/P endoscopy     with laser treatment  08/19/2006  . History of epistaxis   . Hyperlipidemia   . Telangiectasia     Gastric   . Lichen planus     Both lower extremities  . GERD (gastroesophageal reflux disease)   . Heart murmur   . CHF (congestive heart failure)   . Pneumonia 1990's    "twice" (09/15/2012)  . Type II diabetes mellitus     insulin requiring.  Marland Kitchen History of blood transfusion     "getting 2 today; have had transfusions before" (09/15/2012)  . Family history of anesthesia complication     "niece has a hard time coming out" (09/15/2012)  . Chronic diastolic CHF (congestive heart failure) 10/03/2013  . HTN (hypertension), benign 03/02/2012    Past Surgical History  Procedure Laterality Date  . Nose surgery      "for bleeding" (09/15/2012)  . Savory dilation  02/26/2011    Procedure: SAVORY DILATION;  Surgeon: [name], MD;  Location: [name];  Service: Endoscopy;  Laterality: N/A;  c-arm needed  . Esophagogastroduodenoscopy  02/26/2011    Procedure: ESOPHAGOGASTRODUODENOSCOPY (EGD);  Surgeon: [name], MD;  Location: [name];  Service: Endoscopy;  Laterality: N/A;  .  Esophagogastroduodenoscopy N/A 11/08/2012    Procedure: ESOPHAGOGASTRODUODENOSCOPY (EGD);  Surgeon: [name], MD;  Location: [name];  Service: Endoscopy;  Laterality: N/A;  . Cataract surgery    . Esophagogastroduodenoscopy N/A 10/04/2013    Procedure: ESOPHAGOGASTRODUODENOSCOPY (EGD);  Surgeon: Winfield Cunas., MD;  Location: [name];  Service: Endoscopy;  Laterality: N/A;  with APC on stand-by  . Esophagogastroduodenoscopy N/A 07/06/2014    Procedure: ESOPHAGOGASTRODUODENOSCOPY (EGD);  Surgeon: Clarene Essex, MD;  Location: [name];  Service: Endoscopy;  Laterality: N/A;  . Hot hemostasis N/A 07/06/2014    Procedure: HOT HEMOSTASIS (ARGON PLASMA COAGULATION/BICAP);  Surgeon: Clarene Essex, MD;  Location: [name];  Service: Endoscopy;  Laterality: N/A;    Family History  Problem Relation Age of Onset  . Breast cancer    . Malignant hyperthermia Neg Hx   . Stroke Father     Social History:  reports that she quit smoking about 43 years ago. Her smoking use included Cigarettes. She quit after 2 years of use. She has never used smokeless tobacco. She reports that she does not drink alcohol or use illicit drugs.  Allergies:  Allergies  Allergen Reactions  . Aspirin Nausea And Vomiting    Medications; Prior to Admission medications   Medication Sig Start Date End Date Taking? Authorizing Provider  AMITIZA 24 MCG capsule Take 24 mcg by mouth 2 (two) times daily. 08/29/14  Yes Historical Provider, MD  amLODipine (NORVASC) 5 MG tablet Take 1 tablet (5 mg total) by mouth daily. 08/10/14  Yes Traci R Turner, MD  cyclobenzaprine (FLEXERIL) 5 MG tablet Take 1 tablet (5 mg total) by mouth 3 (three) times daily as needed for muscle spasms. 08/28/14  Yes Alma M Devine, MD  ferrous fumarate (HEMOCYTE - 106 MG FE) 325 (106 FE) MG TABS tablet Take 1 tablet by mouth daily.   Yes Historical Provider, MD  furosemide (LASIX) 40 MG tablet Take 40mg twice daily for two days and then start  taking 40mg once daily. Patient taking differently: Take 40 mg by mouth daily.  07/30/14  Yes Traci R Turner, MD  insulin detemir (LEVEMIR) 100 UNIT/ML injection Inject 0.3 mLs (30 Units total) into the skin at bedtime. Patient taking differently: Inject 30-40 Units into the skin 2 (two) times daily. 40 units in the Morning, and 30 units at Bedtime 10/05/13  Yes Alma M Devine, MD  oxymetazoline (AFRIN) 0.05 % nasal spray Place 1 spray into both nostrils 2 (two) times daily as needed (Epistaxis). 10/05/13  Yes Alma M Devine, MD  pantoprazole (PROTONIX) 40 MG tablet Take 1 tablet (40 mg total) by mouth 2 (two) times daily. 07/07/14  Yes Vijaya Akula, MD  vitamin B-12 (CYANOCOBALAMIN) 1000 MCG tablet Take 1,000 mcg by mouth at bedtime.    Yes Historical Provider, MD  Vitamin D, Ergocalciferol, (DRISDOL) 50000 UNITS CAPS Take 50,000 Units by mouth every 7 (seven) days. Takes on Monday   Yes Historical Provider, MD  metoprolol succinate (TOPROL XL) 25 MG 24 hr tablet Take 1 tablet (25 mg total) by mouth daily. Patient not taking: Reported on 08/27/2014 07/27/14   Traci R Turner, MD   . sodium chloride  10 mL/hr Intravenous Once  . amLODipine  5 mg Oral Daily  . ferrous fumarate  1 tablet Oral Daily  . folic acid  1 mg Oral Daily  . furosemide  40 mg Oral Daily  . insulin aspart  0-15 Units Subcutaneous 6 times per day  . insulin detemir  30 Units Subcutaneous QHS  . insulin detemir  40 Units Subcutaneous Daily  . lubiprostone  24 mcg Oral BID  . metoprolol succinate  25 mg Oral Daily  . multivitamin with minerals  1 tablet Oral Daily  . pantoprazole (PROTONIX) IV  40 mg Intravenous Q12H  . sodium chloride  3 mL Intravenous Q12H  . thiamine  100 mg Oral Daily  . vitamin B-12  1,000 mcg Oral QHS  . [START ON 09/10/2014] Vitamin D (Ergocalciferol)  50,000 Units Oral Q7 days   PRN Meds acetaminophen **OR** acetaminophen, cyclobenzaprine, ondansetron **OR** ondansetron (ZOFRAN) IV, sodium  chloride Results for orders placed or performed during the hospital encounter of 09/04/14 (from the past 48 hour(s))  CBG monitoring, ED     Status: Abnormal   Collection Time: 09/04/14  4:12 PM  Result Value Ref Range   Glucose-Capillary 386 (H) 65 - 99 mg/dL  Basic metabolic panel     Status: Abnormal   Collection Time: 09/04/14  4:34 PM  Result Value Ref Range   Sodium 136 135 - 145 mmol/L   Potassium 3.8 3.5 - 5.1 mmol/L   Chloride 99 (L) 101 - 111 mmol/L   CO2 27 22 - 32 mmol/L   Glucose, Bld 411 (H) 65 - 99 mg/dL   BUN 33 (H) 6 - 20 mg/dL   Creatinine, Ser 1.29 (H) 0.44 - 1.00 mg/dL   Calcium 9.0 8.9 -   10.3 mg/dL   GFR calc non Af Amer 40 (L) >60 mL/min   GFR calc Af Amer 46 (L) >60 mL/min    Comment: (NOTE) The eGFR has been calculated using the CKD EPI equation. This calculation has not been validated in all clinical situations. eGFR's persistently <60 mL/min signify possible Chronic Kidney Disease.    Anion gap 10 5 - 15  CBC     Status: Abnormal   Collection Time: 09/04/14  4:34 PM  Result Value Ref Range   WBC 8.4 4.0 - 10.5 K/uL   RBC 2.69 (L) 3.87 - 5.11 MIL/uL   Hemoglobin 6.5 (LL) 12.0 - 15.0 g/dL    Comment: REPEATED TO VERIFY CRITICAL RESULT CALLED TO, READ BACK BY AND VERIFIED WITH: S BIGHAM RN BY A MORRIS MLS @ 1657 7.26.16    HCT 21.5 (L) 36.0 - 46.0 %   MCV 79.9 78.0 - 100.0 fL   MCH 24.2 (L) 26.0 - 34.0 pg   MCHC 30.2 30.0 - 36.0 g/dL   RDW 18.2 (H) 11.5 - 15.5 %   Platelets 328 150 - 400 K/uL  POC occult blood, ED     Status: Abnormal   Collection Time: 09/04/14  5:16 PM  Result Value Ref Range   Fecal Occult Bld POSITIVE (A) NEGATIVE  Type and screen     Status: None   Collection Time: 09/04/14  5:22 PM  Result Value Ref Range   ABO/RH(D) A POS    Antibody Screen NEG    Sample Expiration 09/07/2014    Unit Number [id number]    Blood Component Type RED CELLS,LR    Unit division 00    Status of Unit ISSUED,FINAL    Transfusion Status OK  TO TRANSFUSE    Crossmatch Result Compatible    Unit Number [id number]    Blood Component Type RED CELLS,LR    Unit division 00    Status of Unit ISSUED,FINAL    Transfusion Status OK TO TRANSFUSE    Crossmatch Result Compatible   Prepare RBC     Status: None   Collection Time: 09/04/14  5:23 PM  Result Value Ref Range   Order Confirmation ORDER PROCESSED BY BLOOD BANK   Protime-INR     Status: None   Collection Time: 09/04/14  5:30 PM  Result Value Ref Range   Prothrombin Time 14.8 11.6 - 15.2 seconds   INR 1.14 0.00 - 1.49  APTT     Status: None   Collection Time: 09/04/14  5:30 PM  Result Value Ref Range   aPTT 27 24 - 37 seconds  Urinalysis, Routine w reflex microscopic (not at [facility])     Status: Abnormal   Collection Time: 09/04/14  6:07 PM  Result Value Ref Range   Color, Urine YELLOW YELLOW   APPearance CLOUDY (A) CLEAR   Specific Gravity, Urine 1.007 1.005 - 1.030   pH 6.5 5.0 - 8.0   Glucose, UA NEGATIVE NEGATIVE mg/dL   Hgb urine dipstick NEGATIVE NEGATIVE   Bilirubin Urine NEGATIVE NEGATIVE   Ketones, ur NEGATIVE NEGATIVE mg/dL   Protein, ur NEGATIVE NEGATIVE mg/dL   Urobilinogen, UA 0.2 0.0 - 1.0 mg/dL   Nitrite NEGATIVE NEGATIVE   Leukocytes, UA NEGATIVE NEGATIVE    Comment: MICROSCOPIC NOT DONE ON URINES WITH NEGATIVE PROTEIN, BLOOD, LEUKOCYTES, NITRITE, OR GLUCOSE <1000 mg/dL.  Glucose, capillary     Status: Abnormal   Collection Time: 09/04/14  8:32 PM  Result Value Ref Range   Glucose-Capillary   273 (H) 65 - 99 mg/dL  Prepare RBC     Status: None   Collection Time: 09/04/14  9:00 PM  Result Value Ref Range   Order Confirmation ORDER PROCESSED BY BLOOD BANK   Glucose, capillary     Status: Abnormal   Collection Time: 09/05/14 12:41 AM  Result Value Ref Range   Glucose-Capillary 131 (H) 65 - 99 mg/dL  Glucose, capillary     Status: Abnormal   Collection Time: 09/05/14  4:16 AM  Result Value Ref Range   Glucose-Capillary 51 (L) 65 - 99 mg/dL    No  results found. ROS: Constitutional: some fatigue as her hemoglobin drops. HEENT: negative Cardiovascular: no chest pain Respiratory: some shortness of breath GI: as above GU: negative Musculoskeletal: negative Neuro/Psychiatric: negative Endocrine/Heme: negative            Blood pressure 101/41, pulse 56, temperature 98.3 F (36.8 C), temperature source Oral, resp. rate 16, height 5' 3" (1.6 m), weight 64.8 kg (142 lb 13.7 oz), SpO2 99 %.  Physical exam:   General-- pleasant African-American female in no acute distress ENT-- sclera nonicteric mucous membrane somewhat dry Neck-- no lymphadenopathy Heart-- regular rate and rhythm without murmurs gallops Lungs-- clear Abdomen-- soft and nontender Psych-alert and oriented appropriate-   Assessment:  1. 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 Has had another acute drop in hemoglobin and feel we should start EGD  Plan: 1. Will proceed today with EGD with plans to use the APC if necessary. Have discussed this with the patient and her family including the risk of perforation etc. After this procedure has been accomplished, will consider colonoscopy during this admission.    JR, L 09/05/2014, 7:34 AM   Pager: [phone] If no answer or after hours call [phone]     

## 2014-09-05 NOTE — Interval H&P Note (Signed)
History and Physical Interval Note:  09/05/2014 3:14 PM  [name]  has presented today for surgery, with the diagnosis of GI bleed  The various methods of treatment have been discussed with the patient and family. After consideration of risks, benefits and other options for treatment, the patient has consented to  Procedure(s) with comments: ESOPHAGOGASTRODUODENOSCOPY (EGD) (N/A) - APC on standby to control bleeding as a surgical intervention .  The patient's history has been reviewed, patient examined, no change in status, stable for surgery.  I have reviewed the patient's chart and labs.  Questions were answered to the patient's satisfaction.     Jaquari Reckner JR,Afua Hoots L

## 2014-09-05 NOTE — H&P (View-Only) (Signed)
[facility] CONSULT Reason for consult: G.I. bleeding Referring Physician: [facility]. PCP: Dr. Avbuere. Primary G.I.: Dr. Hayes  [name] is an 74 y.o. female.  HPI: she has a long history of intermittent G.I. 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 She has had several EGD's with treatment of AVMs with fulgaration with APC and endoscopic clipping. Her last colonoscopy was about 8 to 9 years ago and she has tentatively been set up with Dr. Hayes for another colonoscopy in August. She is readmitted with passage of Hellenic stools for the past several weeks. Her last hemoglobin and Dr. Hayes office a couple weeks ago was 8.9, had dropped 6.5 on admission. Denies NSAIDs, blood thinner's or abdominal pain.  Past Medical History  Diagnosis Date  . Anemia   . S/P endoscopy     with laser treatment  08/19/2006  . History of epistaxis   . Hyperlipidemia   . Telangiectasia     Gastric   . Lichen planus     Both lower extremities  . GERD (gastroesophageal reflux disease)   . Heart murmur   . CHF (congestive heart failure)   . Pneumonia 1990's    "twice" (09/15/2012)  . Type II diabetes mellitus     insulin requiring.  . History of blood transfusion     "getting 2 today; have had transfusions before" (09/15/2012)  . Family history of anesthesia complication     "niece has a hard time coming out" (09/15/2012)  . Chronic diastolic CHF (congestive heart failure) 10/03/2013  . HTN (hypertension), benign 03/02/2012    Past Surgical History  Procedure Laterality Date  . Nose surgery      "for bleeding" (09/15/2012)  . Savory dilation  02/26/2011    Procedure: SAVORY DILATION;  Surgeon: [name] C Hayes, MD;  Location: [name];  Service: Endoscopy;  Laterality: N/A;  c-arm needed  . Esophagogastroduodenoscopy  02/26/2011    Procedure: ESOPHAGOGASTRODUODENOSCOPY (EGD);  Surgeon: [name] C Hayes, MD;  Location: [name];  Service: Endoscopy;  Laterality: N/A;  .  Esophagogastroduodenoscopy N/A 11/08/2012    Procedure: ESOPHAGOGASTRODUODENOSCOPY (EGD);  Surgeon: Patrick D Hung, MD;  Location: [name];  Service: Endoscopy;  Laterality: N/A;  . Cataract surgery    . Esophagogastroduodenoscopy N/A 10/04/2013    Procedure: ESOPHAGOGASTRODUODENOSCOPY (EGD);  Surgeon: Vianka Ertel L Elen Acero Jr., MD;  Location: [name];  Service: Endoscopy;  Laterality: N/A;  with APC on stand-by  . Esophagogastroduodenoscopy N/A 07/06/2014    Procedure: ESOPHAGOGASTRODUODENOSCOPY (EGD);  Surgeon: Marc Magod, MD;  Location: [name];  Service: Endoscopy;  Laterality: N/A;  . Hot hemostasis N/A 07/06/2014    Procedure: HOT HEMOSTASIS (ARGON PLASMA COAGULATION/BICAP);  Surgeon: Marc Magod, MD;  Location: [name];  Service: Endoscopy;  Laterality: N/A;    Family History  Problem Relation Age of Onset  . Breast cancer    . Malignant hyperthermia Neg Hx   . Stroke Father     Social History:  reports that she quit smoking about 43 years ago. Her smoking use included Cigarettes. She quit after 2 years of use. She has never used smokeless tobacco. She reports that she does not drink alcohol or use illicit drugs.  Allergies:  Allergies  Allergen Reactions  . Aspirin Nausea And Vomiting    Medications; Prior to Admission medications   Medication Sig Start Date End Date Taking? Authorizing Provider  AMITIZA 24 MCG capsule Take 24 mcg by mouth 2 (two) times daily. 08/29/14  Yes Historical Provider, MD    amLODipine (NORVASC) 5 MG tablet Take 1 tablet (5 mg total) by mouth daily. 08/10/14  Yes Traci R Turner, MD  cyclobenzaprine (FLEXERIL) 5 MG tablet Take 1 tablet (5 mg total) by mouth 3 (three) times daily as needed for muscle spasms. 08/28/14  Yes Alma M Devine, MD  ferrous fumarate (HEMOCYTE - 106 MG FE) 325 (106 FE) MG TABS tablet Take 1 tablet by mouth daily.   Yes Historical Provider, MD  furosemide (LASIX) 40 MG tablet Take 40mg twice daily for two days and then start  taking 40mg once daily. Patient taking differently: Take 40 mg by mouth daily.  07/30/14  Yes Traci R Turner, MD  insulin detemir (LEVEMIR) 100 UNIT/ML injection Inject 0.3 mLs (30 Units total) into the skin at bedtime. Patient taking differently: Inject 30-40 Units into the skin 2 (two) times daily. 40 units in the Morning, and 30 units at Bedtime 10/05/13  Yes Alma M Devine, MD  oxymetazoline (AFRIN) 0.05 % nasal spray Place 1 spray into both nostrils 2 (two) times daily as needed (Epistaxis). 10/05/13  Yes Alma M Devine, MD  pantoprazole (PROTONIX) 40 MG tablet Take 1 tablet (40 mg total) by mouth 2 (two) times daily. 07/07/14  Yes Vijaya Akula, MD  vitamin B-12 (CYANOCOBALAMIN) 1000 MCG tablet Take 1,000 mcg by mouth at bedtime.    Yes Historical Provider, MD  Vitamin D, Ergocalciferol, (DRISDOL) 50000 UNITS CAPS Take 50,000 Units by mouth every 7 (seven) days. Takes on Monday   Yes Historical Provider, MD  metoprolol succinate (TOPROL XL) 25 MG 24 hr tablet Take 1 tablet (25 mg total) by mouth daily. Patient not taking: Reported on 08/27/2014 07/27/14   Traci R Turner, MD   . sodium chloride  10 mL/hr Intravenous Once  . amLODipine  5 mg Oral Daily  . ferrous fumarate  1 tablet Oral Daily  . folic acid  1 mg Oral Daily  . furosemide  40 mg Oral Daily  . insulin aspart  0-15 Units Subcutaneous 6 times per day  . insulin detemir  30 Units Subcutaneous QHS  . insulin detemir  40 Units Subcutaneous Daily  . lubiprostone  24 mcg Oral BID  . metoprolol succinate  25 mg Oral Daily  . multivitamin with minerals  1 tablet Oral Daily  . pantoprazole (PROTONIX) IV  40 mg Intravenous Q12H  . sodium chloride  3 mL Intravenous Q12H  . thiamine  100 mg Oral Daily  . vitamin B-12  1,000 mcg Oral QHS  . [START ON 09/10/2014] Vitamin D (Ergocalciferol)  50,000 Units Oral Q7 days   PRN Meds acetaminophen **OR** acetaminophen, cyclobenzaprine, ondansetron **OR** ondansetron (ZOFRAN) IV, sodium  chloride Results for orders placed or performed during the hospital encounter of 09/04/14 (from the past 48 hour(s))  CBG monitoring, ED     Status: Abnormal   Collection Time: 09/04/14  4:12 PM  Result Value Ref Range   Glucose-Capillary 386 (H) 65 - 99 mg/dL  Basic metabolic panel     Status: Abnormal   Collection Time: 09/04/14  4:34 PM  Result Value Ref Range   Sodium 136 135 - 145 mmol/L   Potassium 3.8 3.5 - 5.1 mmol/L   Chloride 99 (L) 101 - 111 mmol/L   CO2 27 22 - 32 mmol/L   Glucose, Bld 411 (H) 65 - 99 mg/dL   BUN 33 (H) 6 - 20 mg/dL   Creatinine, Ser 1.29 (H) 0.44 - 1.00 mg/dL   Calcium 9.0 8.9 -   10.3 mg/dL   GFR calc non Af Amer 40 (L) >60 mL/min   GFR calc Af Amer 46 (L) >60 mL/min    Comment: (NOTE) The eGFR has been calculated using the CKD EPI equation. This calculation has not been validated in all clinical situations. eGFR's persistently <60 mL/min signify possible Chronic Kidney Disease.    Anion gap 10 5 - 15  CBC     Status: Abnormal   Collection Time: 09/04/14  4:34 PM  Result Value Ref Range   WBC 8.4 4.0 - 10.5 K/uL   RBC 2.69 (L) 3.87 - 5.11 MIL/uL   Hemoglobin 6.5 (LL) 12.0 - 15.0 g/dL    Comment: REPEATED TO VERIFY CRITICAL RESULT CALLED TO, READ BACK BY AND VERIFIED WITH: S BIGHAM RN BY A MORRIS MLS @ 1657 7.26.16    HCT 21.5 (L) 36.0 - 46.0 %   MCV 79.9 78.0 - 100.0 fL   MCH 24.2 (L) 26.0 - 34.0 pg   MCHC 30.2 30.0 - 36.0 g/dL   RDW 18.2 (H) 11.5 - 15.5 %   Platelets 328 150 - 400 K/uL  POC occult blood, ED     Status: Abnormal   Collection Time: 09/04/14  5:16 PM  Result Value Ref Range   Fecal Occult Bld POSITIVE (A) NEGATIVE  Type and screen     Status: None   Collection Time: 09/04/14  5:22 PM  Result Value Ref Range   ABO/RH(D) A POS    Antibody Screen NEG    Sample Expiration 09/07/2014    Unit Number [id number]    Blood Component Type RED CELLS,LR    Unit division 00    Status of Unit ISSUED,FINAL    Transfusion Status OK  TO TRANSFUSE    Crossmatch Result Compatible    Unit Number [id number]    Blood Component Type RED CELLS,LR    Unit division 00    Status of Unit ISSUED,FINAL    Transfusion Status OK TO TRANSFUSE    Crossmatch Result Compatible   Prepare RBC     Status: None   Collection Time: 09/04/14  5:23 PM  Result Value Ref Range   Order Confirmation ORDER PROCESSED BY BLOOD BANK   Protime-INR     Status: None   Collection Time: 09/04/14  5:30 PM  Result Value Ref Range   Prothrombin Time 14.8 11.6 - 15.2 seconds   INR 1.14 0.00 - 1.49  APTT     Status: None   Collection Time: 09/04/14  5:30 PM  Result Value Ref Range   aPTT 27 24 - 37 seconds  Urinalysis, Routine w reflex microscopic (not at [facility])     Status: Abnormal   Collection Time: 09/04/14  6:07 PM  Result Value Ref Range   Color, Urine YELLOW YELLOW   APPearance CLOUDY (A) CLEAR   Specific Gravity, Urine 1.007 1.005 - 1.030   pH 6.5 5.0 - 8.0   Glucose, UA NEGATIVE NEGATIVE mg/dL   Hgb urine dipstick NEGATIVE NEGATIVE   Bilirubin Urine NEGATIVE NEGATIVE   Ketones, ur NEGATIVE NEGATIVE mg/dL   Protein, ur NEGATIVE NEGATIVE mg/dL   Urobilinogen, UA 0.2 0.0 - 1.0 mg/dL   Nitrite NEGATIVE NEGATIVE   Leukocytes, UA NEGATIVE NEGATIVE    Comment: MICROSCOPIC NOT DONE ON URINES WITH NEGATIVE PROTEIN, BLOOD, LEUKOCYTES, NITRITE, OR GLUCOSE <1000 mg/dL.  Glucose, capillary     Status: Abnormal   Collection Time: 09/04/14  8:32 PM  Result Value Ref Range   Glucose-Capillary   273 (H) 65 - 99 mg/dL  Prepare RBC     Status: None   Collection Time: 09/04/14  9:00 PM  Result Value Ref Range   Order Confirmation ORDER PROCESSED BY BLOOD BANK   Glucose, capillary     Status: Abnormal   Collection Time: 09/05/14 12:41 AM  Result Value Ref Range   Glucose-Capillary 131 (H) 65 - 99 mg/dL  Glucose, capillary     Status: Abnormal   Collection Time: 09/05/14  4:16 AM  Result Value Ref Range   Glucose-Capillary 51 (L) 65 - 99 mg/dL    No  results found. ROS: Constitutional: some fatigue as her hemoglobin drops. HEENT: negative Cardiovascular: no chest pain Respiratory: some shortness of breath GI: as above GU: negative Musculoskeletal: negative Neuro/Psychiatric: negative Endocrine/Heme: negative            Blood pressure 101/41, pulse 56, temperature 98.3 F (36.8 C), temperature source Oral, resp. rate 16, height 5' 3" (1.6 m), weight 64.8 kg (142 lb 13.7 oz), SpO2 99 %.  Physical exam:   General-- pleasant African-American female in no acute distress ENT-- sclera nonicteric mucous membrane somewhat dry Neck-- no lymphadenopathy Heart-- regular rate and rhythm without murmurs gallops Lungs-- clear Abdomen-- soft and nontender Psych-alert and oriented appropriate-   Assessment:  1. 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 Has had another acute drop in hemoglobin and feel we should start EGD  Plan: 1. Will proceed today with EGD with plans to use the APC if necessary. Have discussed this with the patient and her family including the risk of perforation etc. After this procedure has been accomplished, will consider colonoscopy during this admission.   Adebayo Ensminger JR,Zurii Hewes L 09/05/2014, 7:34 AM   Pager: [phone] If no answer or after hours call [phone]     

## 2014-09-05 NOTE — Progress Notes (Signed)
Hypoglycemic Event  CBG: 51  Treatment: D50 IV 25 mL  Symptoms: None  Follow-up CBG: Time:0500 CBG Result:133  Possible Reasons for Event: Inadequate meal intake  Comments/MD notified: Per Protocol    [name], [name] M  Remember to initiate Hypoglycemia Order Set & complete

## 2014-09-05 NOTE — Progress Notes (Signed)
Inpatient Diabetes Program Recommendations  AACE/ADA: New Consensus Statement on Inpatient Glycemic Control (2013)  Target Ranges:  Prepandial:   less than 140 mg/dL      Peak postprandial:   less than 180 mg/dL (1-2 hours)      Critically ill patients:  140 - 180 mg/dL   Results for DIAMONDE, KRUSZEWSKI (MRN [id number]) as of 09/05/2014 10:11  Ref. Range 09/04/2014 16:12 09/04/2014 20:32  Glucose-Capillary Latest Ref Range: 65-99 mg/dL 386 (H) 273 (H)   Results for ALEXANDER, CRITCHLEY (MRN [id number]) as of 09/05/2014 10:11  Ref. Range 09/05/2014 00:41 09/05/2014 04:16 09/05/2014 05:12 09/05/2014 07:47 09/05/2014 08:19  Glucose-Capillary Latest Ref Range: 65-99 mg/dL 131 (H) 51 (L) 133 (H) 68 97    Admit with: Anemia/ Weakness/ GIB  History: DM, CKD3, CHF  Home DM Meds: Levemir 40 units AM/ 30 units PM  Current DM Orders: Levemir 40 units QAM/ 30 units QHS            Novolog Moderate SSI (0-15 units) Q4 hours     -Patient received 30 units Levemir last PM.  Currently NPO.  Had Hypoglycemia this AM at 4am and 8am.   MD- Please consider reducing Levemir insulin to 50% of home doses- 20 units QAM and 15 units QHS    Will follow Lachelle Rissler Lowella Dell RN, MSN, CDE Diabetes Coordinator Inpatient Glycemic Control Team Team Pager: [phone] (8a-5p)

## 2014-09-05 NOTE — Op Note (Signed)
[facility] [place], [address]   ENDOSCOPY PROCEDURE REPORT  PATIENT: [name], [name]  MR#: [id number] BIRTHDATE: June 02, 1940 , 74  yrs. old GENDER: female ENDOSCOPIST:Maebry Obrien Oletta Lamas, MD REFERRED BY: Nolene Ebbs, M.D. PROCEDURE DATE:  09/05/2014 PROCEDURE:   EGD w/ control of bleeding ASA CLASS:    Class III INDICATIONS: patient with known history of multiple AVMs that if cause bleeding in the past.  Presented with several days bulimic stools and drop in hemoglobin. MEDICATION: fentanyl 75 mg, versed  5 milligrams IV TOPICAL ANESTHETIC:   Cetacaine Spray  DESCRIPTION OF PROCEDURE:   After the risks and benefits of the procedure were explained, informed consent was obtained.  The adult upper       endoscope was introduced through the mouth  and advanced to the second portion of the duodenum .  The instrument was slowly withdrawn as the mucosa was fully examined. Estimated blood loss is zero unless otherwise noted in this procedure report.    the esophagus was normal with no signs of bleeding. There was some scope trauma right at the GE junction probably from passage of the scope. Along the lesser curve, an old endo clip was seen from previous endoscopic treatments  .there was a large approximately  a meter AVM along the greater curve with what appeared to be a small clot in the middle. The APC was used to cauterize the entire area. There was some small oozing near the distal margin. I injected 1 mL of epinephrine 1 to 10,000 with good control bleeding. At the termination of the procedure there was no active bleeding. The patient tolerated the procedure well.      The scope was then withdrawn from the patient and the procedure completed.  COMPLICATIONS: There were no immediate complications.  ENDOSCOPIC IMPRESSION: 1. Large AVM along the greater curve. This was cauterized and injected with epinephrine with apparent good control  bleeding.  RECOMMENDATIONS: 1. would keep the patient on clear liquids in case further bleeding occurs. 2. Will follow patient clinically. She could potentially be discharged if her hemoglobin is stable over the next couple of days.   _______________________________ Lorrin MaisLaurence Spates, MD 09/05/2014 3:54 PM     cc: Dr Leanne Lovely  CPT CODES: ICD CODES:  The ICD and CPT codes recommended by this software are interpretations from the data that the clinical staff has captured with the software.  The verification of the translation of this report to the ICD and CPT codes and modifiers is the sole responsibility of the health care institution and practicing physician where this report was generated.  [employer] will not be held responsible for the validity of the ICD and CPT codes included on this report.  AMA assumes no liability for data contained or not contained herein. CPT is a Designer, television/film set of the [employer].  PATIENT NAME:  Akila, Hayashi MR#: [id number]

## 2014-09-05 NOTE — Progress Notes (Signed)
Hypoglycemic Event  CBG:67  Treatment: D50 IV 25 mL  Symptoms: Shaky and None  Follow-up CBG: Time:1215 CBG Result:122  Possible Reasons for Event: Inadequate meal intake  Comments/MD notified:Rizwan    Darrow Bussing D  Remember to initiate Hypoglycemia Order Set & complete

## 2014-09-05 NOTE — Progress Notes (Signed)
[facility] Progress Note   [name] [id number] DOB: 05-Oct-1940 DOA: 09/04/2014 PCP: Philis Fendt, MD  Brief narrative: [name] is a 74 y.o. female with CKD 3, DM 2 and h/o GI bleed from AVMs in the past. She presents with generalized weakness and is found to have a Hb of 6.5 and is stool occult positive. Stools have been black but she is on Iron.  Recent admission for UTI/ acute encephalopathy.    Subjective: Was having upper abdominal discomfort earlier across entire upper abdomen- did not radiate to her back. No nausea or diarrhea.   Assessment/Plan: Principal Problem:   Anemia- acute blood loss with underlying Iron deficiency  - being managed for Iron deficiency anemia as outpt - recent drop likely due to acute GI bleed - last Hb 9.1 on 7/19  - s/p 2 U PRBC- Hb has risen appropriately from 6.5 to 8.1  Active Problems: Hemoccult positive - will have EGD today- has has several EGDs with treatments of AVMs in the past - Placed on BID Protonix on admission - also was set up for a colonoscopy as outpt in a couple of weeks    DM (diabetes mellitus), type 2, uncontrolled, with renal complications - becoming hypoglycemic - takes Levemir 40 U in AM and 30 at night- will d/c AM dose as sugars have been dropping < 60    CKD (chronic kidney disease) stage 3?  Cr 0.9 in Feb of this year and ).82 in 5/28 - Cr clearance was > 60 - on Lasix at home for diastolic CHF which is likely causing rise in CR - Current Cr 1.03- cont to very slow hydration while NPO and follow  HTN - cont Amlodipine with holding parameters  - cont metoprolol  - hold lasix- received a dose last night    Appt with PCP: requested Code Status: full code Family Communication:  Disposition Plan: EGD today DVT prophylaxis: SCDs Consultants:GI Procedures:  Antibiotics: Anti-infectives    None      Objective: Filed Weights   09/04/14 2010  Weight: 64.8 kg (142 lb 13.7 oz)     Intake/Output Summary (Last 24 hours) at 09/05/14 1239 Last data filed at 09/05/14 0730  Gross per 24 hour  Intake 1622.5 ml  Output    900 ml  Net  722.5 ml     Vitals Filed Vitals:   09/04/14 2302 09/04/14 2340 09/05/14 0205 09/05/14 0422  BP: 120/40 104/39 104/41 101/41  Pulse: 71 67 61 56  Temp: 98.4 F (36.9 C) 98.3 F (36.8 C) 98.3 F (36.8 C) 98.3 F (36.8 C)  TempSrc: Oral Oral Oral Oral  Resp: 18 18 18 16   Height:      Weight:      SpO2: 100% 100% 100% 99%    Exam:  General:  Pt is alert, not in acute distress  HEENT: No icterus, No thrush, oral mucosa moist  Cardiovascular: regular rate and rhythm, S1/S2 No murmur  Respiratory: clear to auscultation bilaterally   Abdomen: Soft, +Bowel sounds, non tender, non distended, no guarding  MSK: No LE edema, cyanosis or clubbing  Data Reviewed: Basic Metabolic Panel:  Recent Labs Lab 09/04/14 1634 09/05/14 0720  NA 136 145  K 3.8 3.2*  CL 99* 105  CO2 27 30  GLUCOSE 411* 73  BUN 33* 24*  CREATININE 1.29* 1.03*  CALCIUM 9.0 8.9   Liver Function Tests:  Recent Labs Lab 09/05/14 0720  AST 16  ALT 15  ALKPHOS 77  BILITOT 1.4*  PROT 5.5*  ALBUMIN 3.1*   No results for input(s): LIPASE, AMYLASE in the last 168 hours. No results for input(s): AMMONIA in the last 168 hours. CBC:  Recent Labs Lab 09/04/14 1634 09/05/14 0720  WBC 8.4 6.3  HGB 6.5* 8.1*  HCT 21.5* 26.2*  MCV 79.9 80.6  PLT 328 264   Cardiac Enzymes: No results for input(s): CKTOTAL, CKMB, CKMBINDEX, TROPONINI in the last 168 hours. BNP (last 3 results) No results for input(s): BNP in the last 8760 hours.  ProBNP (last 3 results)  Recent Labs  10/03/13 1400 07/27/14 1548  PROBNP 155.7* 866.0*    CBG:  Recent Labs Lab 09/05/14 0416 09/05/14 0512 09/05/14 0747 09/05/14 0819 09/05/14 1143  GLUCAP 51* 133* 68 97 67    Recent Results (from the past 240 hour(s))  Culture, blood (routine x 2)     Status:  None   Collection Time: 08/27/14  2:15 PM  Result Value Ref Range Status   Specimen Description BLOOD PORTA CATH  Final   Special Requests BOTTLES DRAWN AEROBIC AND ANAEROBIC 5ML  Final   Culture   Final    NO GROWTH 5 DAYS Performed at [facility]    Report Status 09/01/2014 FINAL  Final  Urine culture     Status: None   Collection Time: 08/27/14  2:58 PM  Result Value Ref Range Status   Specimen Description URINE, RANDOM  Final   Special Requests NONE  Final   Culture   Final    MULTIPLE SPECIES PRESENT, SUGGEST RECOLLECTION IF CLINICALLY INDICATED Performed at [facility]    Report Status 08/29/2014 FINAL  Final  Culture, blood (routine x 2)     Status: None   Collection Time: 08/27/14  2:59 PM  Result Value Ref Range Status   Specimen Description BLOOD LEFT HAND  Final   Special Requests BOTTLES DRAWN AEROBIC AND ANAEROBIC 5ML  Final   Culture   Final    NO GROWTH 5 DAYS Performed at [facility]    Report Status 09/01/2014 FINAL  Final     Studies: No results found.  Scheduled Meds:  Scheduled Meds: . sodium chloride  10 mL/hr Intravenous Once  . amLODipine  5 mg Oral Daily  . ferrous fumarate  1 tablet Oral Daily  . folic acid  1 mg Oral Daily  . furosemide  40 mg Oral Daily  . insulin aspart  0-15 Units Subcutaneous 6 times per day  . insulin detemir  30 Units Subcutaneous QHS  . lubiprostone  24 mcg Oral BID  . metoprolol succinate  25 mg Oral Daily  . multivitamin with minerals  1 tablet Oral Daily  . pantoprazole (PROTONIX) IV  40 mg Intravenous Q12H  . sodium chloride  3 mL Intravenous Q12H  . thiamine  100 mg Oral Daily  . vitamin B-12  1,000 mcg Oral QHS  . [START ON 09/10/2014] Vitamin D (Ergocalciferol)  50,000 Units Oral Q7 days   Continuous Infusions: . sodium chloride 125 mL/hr at 09/04/14 1809  . sodium chloride 50 mL/hr at 09/04/14 2221  . sodium chloride 20 mL/hr at 09/05/14 1029    Time spent on care of this  patient: 36 min   Dora, MD 09/05/2014, 12:39 PM  LOS: 1 day   [facility] Office  [phone] Pager - Text Page per [email or web address] If 7PM-7AM, please contact night-coverage [email or web address]

## 2014-09-05 NOTE — Progress Notes (Signed)
Hypoglycemic Event  CBG: 65  Treatment: 15 GM carbohydrate snack  Symptoms: None  Follow-up CBG: Time  1525 CBG Result: 67  Possible Reasons for Event: Inadequate meal intake  Comments/MD notified: rizwan    Darrow Bussing D  Remember to initiate Hypoglycemia Order Set & complete

## 2014-09-05 NOTE — Care Management Note (Signed)
Case Management Note  Patient Details  Name: [name] MRN: [id number] Date of Birth: November 13, 1940  Subjective/Objective: 74 y/o f admitted w/anemia.Readmit 7/13-7/21-UTI.From home.                   Action/Plan:GI cons, EGD.d/c plan home.   Expected Discharge Date:   (unknown)               Expected Discharge Plan:  Home/Self Care  In-House Referral:     Discharge planning Services  CM Consult  Post Acute Care Choice:    Choice offered to:     DME Arranged:    DME Agency:     HH Arranged:    HH Agency:     Status of Service:  In process, will continue to follow  Medicare Important Message Given:    Date Medicare IM Given:    Medicare IM give by:    Date Additional Medicare IM Given:    Additional Medicare Important Message give by:     If discussed at [place] of Stay Meetings, dates discussed:    Additional Comments:  Dessa Phi, RN 09/05/2014, 3:39 PM

## 2014-09-05 NOTE — Progress Notes (Signed)
Hypoglycemic Event  CBG: 68  Treatment: 15 GM carbohydrate snack  Symptoms: None  Follow-up CBG: [id number] CBG Result 96  Possible Reasons for Event: Inadequate meal intake  Comments/MD notified:Rizwan    Darrow Bussing D  Remember to initiate Hypoglycemia Order Set & complete

## 2014-09-05 NOTE — Progress Notes (Signed)
Pt reports having "shakes" after large BM. VSS. Had EGD today. On call NP K. Schorr notified. Will continue to monitor pt. Carnella [place] I

## 2014-09-06 ENCOUNTER — Encounter (HOSPITAL_COMMUNITY): Payer: Self-pay | Admitting: Gastroenterology

## 2014-09-06 DIAGNOSIS — N183 Chronic kidney disease, stage 3 (moderate): Secondary | ICD-10-CM

## 2014-09-06 DIAGNOSIS — E1129 Type 2 diabetes mellitus with other diabetic kidney complication: Secondary | ICD-10-CM

## 2014-09-06 DIAGNOSIS — K922 Gastrointestinal hemorrhage, unspecified: Principal | ICD-10-CM

## 2014-09-06 DIAGNOSIS — D62 Acute posthemorrhagic anemia: Secondary | ICD-10-CM

## 2014-09-06 DIAGNOSIS — E1165 Type 2 diabetes mellitus with hyperglycemia: Secondary | ICD-10-CM

## 2014-09-06 LAB — CBC
HCT: 26.4 % — ABNORMAL LOW (ref 36.0–46.0)
Hemoglobin: 8.3 g/dL — ABNORMAL LOW (ref 12.0–15.0)
MCH: 25.9 pg — AB (ref 26.0–34.0)
MCHC: 31.4 g/dL (ref 30.0–36.0)
MCV: 82.2 fL (ref 78.0–100.0)
Platelets: 266 10*3/uL (ref 150–400)
RBC: 3.21 MIL/uL — AB (ref 3.87–5.11)
RDW: 16.7 % — AB (ref 11.5–15.5)
WBC: 11.3 10*3/uL — ABNORMAL HIGH (ref 4.0–10.5)

## 2014-09-06 LAB — BASIC METABOLIC PANEL
Anion gap: 5 (ref 5–15)
BUN: 17 mg/dL (ref 6–20)
CALCIUM: 8.7 mg/dL — AB (ref 8.9–10.3)
CO2: 28 mmol/L (ref 22–32)
Chloride: 109 mmol/L (ref 101–111)
Creatinine, Ser: 0.98 mg/dL (ref 0.44–1.00)
GFR calc Af Amer: >60 mL/min (ref >60)
GFR calc non Af Amer: 55 mL/min — ABNORMAL LOW (ref >60)
GLUCOSE: 49 mg/dL — AB (ref 65–99)
POTASSIUM: 3.6 mmol/L (ref 3.5–5.1)
Sodium: 142 mmol/L (ref 135–145)

## 2014-09-06 LAB — GLUCOSE, CAPILLARY
GLUCOSE-CAPILLARY: 55 mg/dL — AB (ref 65–99)
GLUCOSE-CAPILLARY: 255 mg/dL — AB (ref 65–99)
GLUCOSE-CAPILLARY: 225 mg/dL — AB (ref 65–99)
Glucose-Capillary: 133 mg/dL — ABNORMAL HIGH (ref 65–99)
Glucose-Capillary: 167 mg/dL — ABNORMAL HIGH (ref 65–99)
Glucose-Capillary: 172 mg/dL — ABNORMAL HIGH (ref 65–99)
Glucose-Capillary: 175 mg/dL — ABNORMAL HIGH (ref 65–99)
Glucose-Capillary: 208 mg/dL — ABNORMAL HIGH (ref 65–99)

## 2014-09-06 LAB — HEMOGLOBIN A1C
HEMOGLOBIN A1C: 7.3 % — AB (ref 4.8–5.6)
Mean Plasma Glucose: 163 mg/dL

## 2014-09-06 NOTE — Progress Notes (Signed)
Inpatient Diabetes Program Recommendations  AACE/ADA: New Consensus Statement on Inpatient Glycemic Control (2013)  Target Ranges:  Prepandial:   less than 140 mg/dL      Peak postprandial:   less than 180 mg/dL (1-2 hours)      Critically ill patients:  140 - 180 mg/dL    Results for [name], [name] (MRN [id number]) as of 09/06/2014 12:25  Ref. Range 09/05/2014 00:41 09/05/2014 04:16 09/05/2014 05:12 09/05/2014 07:47 09/05/2014 08:19 09/05/2014 11:43 09/05/2014 12:20 09/05/2014 14:07 09/05/2014 16:54 09/05/2014 17:27 09/05/2014 18:04 09/05/2014 20:01  Glucose-Capillary Latest Ref Range: 65-99 mg/dL 131 (H) 51 (L) 133 (H) 68 97 67 122 (H) 84 65 67 110 (H) 354 (H)   Results for [name], [name] (MRN [id number]) as of 09/06/2014 12:25  Ref. Range 09/05/2014 23:59 09/06/2014 04:13 09/06/2014 04:54 09/06/2014 07:42  Glucose-Capillary Latest Ref Range: 65-99 mg/dL 175 (H) 55 (L) 133 (H) 167 (H)    Home DM Meds: Levemir 40 units AM/ 30 units PM  Current DM Orders: Levemir 30 units QHS  Novolog Moderate SSI (0-15 units) Q4 hours    -Note patient had multiple episodes of Hypoglycemia yesterday throughout the day.  -RN held Levemir 30 units last PM.   Will follow Wyn Quaker RN, MSN, CDE Diabetes Coordinator Inpatient Glycemic Control Team Team Pager: [phone] (8a-5p)

## 2014-09-06 NOTE — Progress Notes (Signed)
[facility] Progress Note   [name] [id number] DOB: [birth year] DOA: 09/04/2014 PCP: Philis Fendt, MD  Brief narrative: [name] is a 74 y.o. female with CKD 3, DM 2 and h/o GI bleed from AVMs in the past. She presents with generalized weakness and is found to have a Hb of 6.5 and is stool occult positive. Stools have been black but she is on Iron.  Recent admission for UTI/ acute encephalopathy.    Subjective: Had multiple stools last night- dark brown. No abdominal pain, nausea, dizziness, vomiting.  No other complaints.   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 - Cr 0.9 in Feb of this year and ).82 in 5/28 - Cr clearance was > 60 - on Lasix at home for diastolic CHF which is likely causing rise in CR - Admission Cr 1.03- now 0.98- d/c IVF  HTN - cont Amlodipine with holding parameters  - cont metoprolol  - hold lasix- received a dose on the night of admission    Appt with PCP: requested Code Status: full code Family Communication:  Disposition Plan: home when OK with GI DVT prophylaxis: SCDs Consultants:GI Procedures:  EGD  Antibiotics: Anti-infectives    None      Objective: Filed Weights   09/04/14 2010 09/06/14 0416  Weight: 64.8 kg (142 lb 13.7 oz) 65.5 kg (144 lb 6.4 oz)    Intake/Output Summary (Last 24 hours) at 09/06/14 1415 Last data filed at 09/06/14 0700  Gross per 24 hour  Intake   2100 ml  Output      0 ml  Net   2100 ml     Vitals Filed Vitals:   09/05/14 2004 09/05/14 2016 09/06/14 0416 09/06/14 0938  BP: 94/36 109/43 106/39 105/62  Pulse: 60  69 70  Temp: 97.8 F (36.6 C)  99.1 F (37.3 C)   TempSrc: Oral  Oral   Resp: 14  20   Height:      Weight:   65.5 kg (144 lb 6.4 oz)   SpO2: 100%  100%     Exam:  General:  Pt is alert, not in acute distress  HEENT: No icterus, No thrush, oral mucosa moist  Cardiovascular: regular rate and rhythm, S1/S2 No murmur  Respiratory: clear to auscultation bilaterally   Abdomen: Soft, +Bowel sounds, non tender, non distended, no guarding  MSK: No LE edema, cyanosis or clubbing  Data Reviewed: Basic Metabolic Panel:  Recent Labs Lab 09/04/14 1634 09/05/14 0720 09/06/14 0325  NA 136 145 142  K 3.8 3.2* 3.6  CL 99* 105 109  CO2 27 30 28   GLUCOSE 411* 73 49*  BUN 33* 24* 17  CREATININE 1.29* 1.03* 0.98  CALCIUM 9.0 8.9 8.7*   Liver Function Tests:  Recent Labs Lab 09/05/14 0720  AST 16  ALT 15  ALKPHOS 77  BILITOT 1.4*  PROT 5.5*  ALBUMIN 3.1*   No results for input(s): LIPASE, AMYLASE in the last 168 hours. No results for input(s): AMMONIA in the last 168 hours. CBC:  Recent Labs Lab 09/04/14 1634 09/05/14 0720 09/06/14 0325  WBC 8.4 6.3 11.3*  HGB 6.5* 8.1* 8.3*  HCT 21.5* 26.2* 26.4*  MCV 79.9 80.6 82.2  PLT 328 264 266   Cardiac Enzymes: No results for input(s): CKTOTAL, CKMB, CKMBINDEX, TROPONINI in the last 168 hours. BNP (last 3 results) No results for input(s): BNP in the last 8760 hours.  ProBNP (last 3 results)  Recent Labs  10/03/13 1400 07/27/14 1548  PROBNP 155.7* 866.0*     CBG:  Recent Labs Lab 09/05/14 2359 09/06/14 0413 09/06/14 0454 09/06/14 0742 09/06/14 1237  GLUCAP 175* 55* 133* 167* 208*    Recent Results (from the past 240 hour(s))  Urine culture     Status: None   Collection Time: 08/27/14  2:58 PM  Result Value Ref Range Status   Specimen Description URINE, RANDOM  Final   Special Requests NONE  Final   Culture   Final    MULTIPLE SPECIES PRESENT, SUGGEST RECOLLECTION IF CLINICALLY INDICATED Performed at [facility]    Report Status 08/29/2014 FINAL  Final  Culture, blood (routine x 2)     Status: None   Collection Time: 08/27/14  2:59 PM  Result Value Ref Range Status   Specimen Description BLOOD LEFT HAND  Final   Special Requests BOTTLES DRAWN AEROBIC AND ANAEROBIC 5ML  Final   Culture   Final    NO GROWTH 5 DAYS Performed at [facility]    Report Status 09/01/2014 FINAL  Final     Studies: No results found.  Scheduled Meds:  Scheduled Meds: . amLODipine  5 mg Oral Daily  . ferrous fumarate  1 tablet Oral Daily  . folic acid  1 mg Oral Daily  . insulin aspart  0-15 Units Subcutaneous 6 times per day  . insulin detemir  30 Units Subcutaneous QHS  . lubiprostone  24 mcg Oral BID  . metoprolol succinate  25 mg Oral Daily  . multivitamin with minerals  1 tablet Oral Daily  . pantoprazole (PROTONIX) IV  40 mg Intravenous Q12H  . sodium chloride  3 mL Intravenous Q12H  . thiamine  100 mg Oral Daily  . vitamin B-12  1,000 mcg Oral QHS  . [START ON 09/10/2014] Vitamin D (Ergocalciferol)  50,000 Units Oral Q7 days   Continuous Infusions: . sodium chloride 50 mL/hr at 09/06/14 1257    Time spent on care of this patient: 35 min   [place], MD 09/06/2014, 2:15 PM  LOS: 2 days   [facility] Office  [phone] Pager - Text Page per [email or web address] If 7PM-7AM, please contact night-coverage [email or web address]

## 2014-09-06 NOTE — Progress Notes (Signed)
[facility] PROGRESS NOTE Subjective patient really feels good no further bleeding. [place] paintin  Objective: Vital signs in last 24 hours: Temp:  [97.6 F (36.4 C)-99.1 F (37.3 C)] 99.1 F (37.3 C) (07/28 0416) Pulse Rate:  [57-70] 70 (07/28 0938) Resp:  [11-20] 20 (07/28 0416) BP: (90-134)/(31-62) 105/62 mmHg (07/28 0938) SpO2:  [98 %-100 %] 100 % (07/28 0416) Weight:  [65.5 kg (144 lb 6.4 oz)] 65.5 kg (144 lb 6.4 oz) (07/28 0416) Last BM Date: 09/05/14  Intake/Output from previous day: 07/27 0701 - 07/28 0700 In: 2100 [P.O.:840; I.V.:1260] Out: 100 [Urine:100] Intake/Output this shift:    PE:  Abdomen-- soft and nontender  Lab Results:  Recent Labs  09/04/14 1634 09/05/14 0720 09/06/14 0325  WBC 8.4 6.3 11.3*  HGB 6.5* 8.1* 8.3*  HCT 21.5* 26.2* 26.4*  PLT 328 264 266   BMET  Recent Labs  09/04/14 1634 09/05/14 0720 09/06/14 0325  NA 136 145 142  K 3.8 3.2* 3.6  CL 99* 105 109  CO2 27 30 28   CREATININE 1.29* 1.03* 0.98   LFT  Recent Labs  09/05/14 0720  PROT 5.5*  AST 16  ALT 15  ALKPHOS 77  BILITOT 1.4*   PT/INR  Recent Labs  09/04/14 1730 09/05/14 0720  LABPROT 14.8 14.7  INR 1.14 1.13   PANCREAS No results for input(s): LIPASE in the last 72 hours.       Studies/Results: No results found.  Medications: I have reviewed the patient's current medications.  Assessment/Plan: 1. G.I. bleed secondary gastric AVM. Doing well after Fulgaration. Hemoglobin stable.  Would plan on discharge in the morning for hemoglobin stable. She should be on a low residue diet. She has a colonoscopy scheduled in about a week with Dr. [name] Plenty will go ahead and continue with this plan. Malu Pellegrini JR,Zedekiah Hinderman L 09/06/2014, 3:34 PM  Pager: [phone] If no answer or after hours call [phone]

## 2014-09-06 NOTE — Care Management Important Message (Signed)
Important Message  Patient Details  Name: [name] MRN: [id number] Date of Birth: 1940-12-29   Medicare Important Message Given:  [facility] notification given    Camillo Flaming 09/06/2014, 1:24 [place] Message  Patient Details  Name: [name] MRN: [id number] Date of Birth: 22-May-1940   Medicare Important Message Given:  Yes-second notification given    Camillo Flaming 09/06/2014, 1:24 PM

## 2014-09-07 DIAGNOSIS — I780 Hereditary hemorrhagic telangiectasia: Secondary | ICD-10-CM

## 2014-09-07 LAB — CBC
HEMATOCRIT: 25.5 % — AB (ref 36.0–46.0)
Hemoglobin: 7.9 g/dL — ABNORMAL LOW (ref 12.0–15.0)
MCH: 25.9 pg — ABNORMAL LOW (ref 26.0–34.0)
MCHC: 31 g/dL (ref 30.0–36.0)
MCV: 83.6 fL (ref 78.0–100.0)
PLATELETS: 240 10*3/uL (ref 150–400)
RBC: 3.05 MIL/uL — ABNORMAL LOW (ref 3.87–5.11)
RDW: 16.9 % — ABNORMAL HIGH (ref 11.5–15.5)
WBC: 6.2 10*3/uL (ref 4.0–10.5)

## 2014-09-07 LAB — GLUCOSE, CAPILLARY
Glucose-Capillary: 111 mg/dL — ABNORMAL HIGH (ref 65–99)
Glucose-Capillary: 216 mg/dL — ABNORMAL HIGH (ref 65–99)

## 2014-09-07 MED ORDER — METOPROLOL SUCCINATE ER 25 MG PO TB24: 12.5000 mg | ORAL_TABLET | Freq: Every day | ORAL | Status: DC

## 2014-09-07 MED ORDER — HEPARIN SOD (PORK) LOCK FLUSH 100 UNIT/ML IV SOLN
500.0000 [IU] | INTRAVENOUS | Status: DC | PRN
  Administered 2014-09-07: 500 [IU]

## 2014-09-07 MED ORDER — HEPARIN SOD (PORK) LOCK FLUSH 100 UNIT/ML IV SOLN: 500.0000 [IU] | INTRAVENOUS | Status: DC

## 2014-09-07 NOTE — Care Management Note (Signed)
Case Management Note  Patient Details  Name: [name] MRN: [id number] Date of Birth: Apr 10, 1940  Subjective/Objective:                    Action/Plan:d/c home no needs or orders.   Expected Discharge Date:   (unknown)               Expected Discharge Plan:  Home/Self Care  In-House Referral:     Discharge planning Services  CM Consult  Post Acute Care Choice:    Choice offered to:     DME Arranged:    DME Agency:     HH Arranged:    [place] Agency:     Status of Service:  Completed, signed off  Medicare Important Message Given:  Yes-second notification given Date Medicare IM Given:    Medicare IM give by:    Date Additional Medicare IM Given:    Additional Medicare Important Message give by:     If discussed at [place] of Stay Meetings, dates discussed:    Additional Comments:  Dessa Phi, RN 09/07/2014, 9:46 AM

## 2014-09-07 NOTE — Progress Notes (Signed)
[facility] PROGRESS NOTE Subjective patient tolerating diet no gross bleeding feels well.  Objective: Vital signs in last 24 hours: Temp:  [97.8 F (36.6 C)-98.4 F (36.9 C)] 97.8 F (36.6 C) (07/29 0813) Pulse Rate:  [56-73] 73 (07/29 0813) Resp:  [16-20] 16 (07/29 0449) BP: (97-156)/(31-62) 156/59 mmHg (07/29 0813) SpO2:  [94 %-100 %] 94 % (07/29 0813) FiO2 (%):  [2 %] 2 % (07/29 0813) Weight:  [66.2 kg (145 lb 15.1 oz)-75.4 kg (166 lb 3.6 oz)] 75.4 kg (166 lb 3.6 oz) (07/29 0813) Last BM Date: 09/06/14  Intake/Output from previous day: 07/28 0701 - 07/29 0700 In: 480 [P.O.:480] Out: -  Intake/Output this shift:      Lab Results:  Recent Labs  09/04/14 1634 09/05/14 0720 09/06/14 0325 09/07/14 0441  WBC 8.4 6.3 11.3* 6.2  HGB 6.5* 8.1* 8.3* 7.9*  HCT 21.5* 26.2* 26.4* 25.5*  PLT 328 264 266 240   BMET  Recent Labs  09/04/14 1634 09/05/14 0720 09/06/14 0325  NA 136 145 142  K 3.8 3.2* 3.6  CL 99* 105 109  CO2 27 30 28   CREATININE 1.29* 1.03* 0.98   LFT  Recent Labs  09/05/14 0720  PROT 5.5*  AST 16  ALT 15  ALKPHOS 77  BILITOT 1.4*   PT/INR  Recent Labs  09/04/14 1730 09/05/14 0720  LABPROT 14.8 14.7  INR 1.14 1.13   PANCREAS No results for input(s): LIPASE in the last 72 hours.       Studies/Results: No results found.  Medications: I have reviewed the patient's current medications.  Assessment/Plan: 1. G.I. bleed. Probably due to gastric AVM which was cauterized. Her hemoglobin has basically been stable since then and she says her stools are starting to lighten. I think she could go home. She has a colonoscopy Artie scheduled this coming Wednesday with Dr. [name] Plenty. I have made arrangements for her to get a follow-up CBC when she comes in for her colonoscopy. I would strongly recommend that she go home on low residue diet with no roughage to avoid irritating the ulcerated area from the large AVM in her stomach that was  cauterized. Would also suggest that she take a dose of Miralax daily until she begins her colonoscopy prep Tuesday. If she has any worsening bleeding she can give [place] a call over the weekend.   Evann Koelzer JR,Kiana Hollar L 09/07/2014, 8:33 AM  Pager: [phone] If no answer or after hours call [phone]  2

## 2014-09-07 NOTE — Discharge Summary (Addendum)
Physician Discharge Summary  [name] [id number] DOB: 1940/10/18 DOA: 09/04/2014  PCP: Philis Fendt, MD  Admit date: 09/04/2014 Discharge date: 09/07/2014  Time spent: 55 minutes  Recommendations for Outpatient Follow-up:  1. Follow Hb-   Discharge Condition: stable Diet on discharge:  low residue, diabetic, low sodium, heart healthy- discussed with patient  Discharge Diagnoses:  Principal Problem:   Acute blood loss anemia Active Problems:   Acute lower GI bleeding   Iron deficiency anemia   Osler-Weber-Rendu disease   DM (diabetes mellitus), type 2, uncontrolled, with renal complications   CKD (chronic kidney disease) stage 3, GFR 30-59 ml/min   History of present illness:  [name] is a 74 y.o. female with CKD 3, DM 2 and h/o GI bleed from AVMs in the past. She presents with generalized weakness and is found to have a Hb of 6.5 and is stool occult positive. Stools have been black but she is on Iron.  Recent admission for UTI/ acute encephalopathy.   Hospital Course:  Principal Problem:  Anemia- acute blood loss with underlying Iron deficiency  - being managed for Iron deficiency anemia as outpt-- has seen Dr [name] for this in the past  (06/18/14) for her chronic blood loss- he has given her IV Iron in the past-- he continues to follow her labs-  recommended to continue Iron replacement - recent drop likely due to acute GI bleed - last Hb 9.1 on 7/19  - s/p 2 U PRBC- Hb has risen appropriately from 6.5 to 8.1- dwon to 7.9 today but has been having brown stools therefore has stopped bleeding- no need to transfuse further- Hb will be checked next week at cancer center  Active Problems: Hemoccult positive -s/p EGD on 7/27- 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, cauterized and injected with Epi - has has several EGDs with treatments of AVMs in the past - also has a prior appt for a colonoscopy with Dr [name] Plenty foe next Wed - she has been having brown stools for the  past couple of days- stool light brown today - advised to take low residue diet    Osler-Weber-Rendu disease - causing above AVMs   DM (diabetes mellitus), type 2, uncontrolled, with renal complications - episodically hypoglycemic in the hospital due to variable diet and PO intake - takes Levemir 40 U in AM and 30 at night- have d/c'd AM dose as sugars have been dropping < 60 - sugars run quite high at home per patient and her god daughter- can cont home dose of Insulin on discharge   CKD (chronic kidney disease) stage 3? - doubtful she has CKD 3, she is on Lasix at home with may cause intermittent rise in Cr - Cr 0.9 in Feb of this year and ).82 in 5/28 - Cr clearance was > 60 - Admission Cr 1.03- now 0.98- d/c'd IVF  Hypotension with h/o HTN - d/c Amlodipine  - will decrease Toprol to 12.5 from 25    Procedures:  EGD  Consultations:  GI  Discharge Exam: Filed Weights   09/04/14 2010 09/06/14 0416 09/07/14 0449  Weight: 64.8 kg (142 lb 13.7 oz) 65.5 kg (144 lb 6.4 oz) 66.2 kg (145 lb 15.1 oz)   Filed Vitals:   09/07/14 0449  BP: 103/42  Pulse: 64  Temp: 98.4 F (36.9 C)  Resp: 16    General: AAO x 3, no distress Cardiovascular: RRR, no murmurs  Respiratory: clear to auscultation bilaterally GI: soft, non-tender, non-distended, bowel  sound positive  Discharge Instructions You were cared for by a hospitalist during your hospital stay. If you have any questions about your discharge medications or the care you received while you were in the hospital after you are discharged, you can call the unit and asked to speak with the hospitalist on call if the hospitalist that took care of you is not available. Once you are discharged, your primary care physician will handle any further medical issues. Please note that NO REFILLS for any discharge medications will be authorized once you are discharged, as it is imperative that you return to your primary care physician (or  establish a relationship with a primary care physician if you do not have one) for your aftercare needs so that they can reassess your need for medications and monitor your lab values.      Discharge Instructions    Discharge instructions    Complete by:  As directed   Diabetic, low sodium, heart healthy diet     Discharge instructions    Complete by:  As directed   BP has been quite low- hold Norvasc and cut Back on Toprol- have BP checked by family doctor in 1 wk     Increase activity slowly    Complete by:  As directed             Medication List    STOP taking these medications        amLODipine 5 MG tablet  Commonly known as:  NORVASC      TAKE these medications        AMITIZA 24 MCG capsule  Generic drug:  lubiprostone  Take 24 mcg by mouth 2 (two) times daily.     cyclobenzaprine 5 MG tablet  Commonly known as:  FLEXERIL  Take 1 tablet (5 mg total) by mouth 3 (three) times daily as needed for muscle spasms.     ferrous fumarate 325 (106 FE) MG Tabs tablet  Commonly known as:  HEMOCYTE - 106 mg FE  Take 1 tablet by mouth daily.     furosemide 40 MG tablet  Commonly known as:  LASIX  Take 40mg  twice daily for two days and then start taking 40mg  once daily.     insulin detemir 100 UNIT/ML injection  Commonly known as:  LEVEMIR  Inject 0.3 mLs (30 Units total) into the skin at bedtime.     metoprolol succinate 25 MG 24 hr tablet  Commonly known as:  TOPROL XL  Take 0.5 tablets (12.5 mg total) by mouth daily.     oxymetazoline 0.05 % nasal spray  Commonly known as:  AFRIN  Place 1 spray into both nostrils 2 (two) times daily as needed (Epistaxis).     pantoprazole 40 MG tablet  Commonly known as:  PROTONIX  Take 1 tablet (40 mg total) by mouth 2 (two) times daily.     vitamin B-12 1000 MCG tablet  Commonly known as:  CYANOCOBALAMIN  Take 1,000 mcg by mouth at bedtime.     Vitamin D (Ergocalciferol) 50000 UNITS Caps capsule  Commonly known as:  DRISDOL   Take 50,000 Units by mouth every 7 (seven) days. Takes on Monday       Allergies  Allergen Reactions  . Aspirin Nausea And Vomiting      The results of significant diagnostics from this hospitalization (including imaging, microbiology, ancillary and laboratory) are listed below for reference.    Significant Diagnostic Studies: Ct Head Wo Contrast  08/27/2014  CLINICAL DATA:  Mental status changes and confusion since 08/20/2014  EXAM: CT HEAD WITHOUT CONTRAST  TECHNIQUE: Contiguous axial images were obtained from the base of the skull through the vertex without intravenous contrast.  COMPARISON:  None.  FINDINGS: The brain shows generalized atrophy. There chronic small-vessel ischemic changes affecting the hemispheric white matter. No sign of acute infarction, mass lesion, hemorrhage, hydrocephalus or extra-axial collection. No calvarial abnormality. There is atherosclerotic calcification of the major vessels at the base of the brain. Chronic appearing opacification of the right maxillary sinus with slight expansion raising the possibility of mucocele.  IMPRESSION: No acute intracranial finding. Age related atrophy. Mild chronic appearing small vessel change of the white matter.  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   Electronically Signed   By: Nelson Chimes M.D.   On: 08/27/2014 14:31   Mr Brain Wo Contrast  08/28/2014   CLINICAL DATA:  73 year old female with altered mental status and acute encephalopathy which has worsened over 24 hr with agitation. Initial encounter.  EXAM: MRI HEAD WITHOUT CONTRAST  TECHNIQUE: Multiplanar, multiecho pulse sequences of the brain and surrounding structures were obtained without intravenous contrast.  COMPARISON:  Head CT without contrast 08/27/2014.  FINDINGS: Cerebral volume is within normal limits for age. No restricted diffusion to suggest acute infarction. No midline shift, mass effect, evidence of mass lesion, ventriculomegaly, extra-axial collection  or acute intracranial hemorrhage. Cervicomedullary junction and pituitary are within normal limits. Major intracranial vascular flow voids are grossly preserved.  Increased basal ganglia it intrinsic T1 signal is evident on sagittal images (series 4). Patchy and confluent bilateral cerebral white matter T2 and FLAIR hyperintensity, nonspecific. No associated cortical encephalomalacia or chronic cerebral blood products identified. Brainstem and cerebellum are within normal limits.  Complete right maxillary sinus opacification with some inspissated material. Mild to moderate right ethmoid sinus mucosal thickening. Other Visualized paranasal sinuses and mastoids are clear. Negative scalp soft tissues. Postoperative changes to the globes. Negative visualized cervical spine. Nonspecific decreased T1 bone marrow signal. No destructive osseous lesion identified.  IMPRESSION: 1. No acute infarct identified. 2. Increased basal ganglia intrinsic T1 signal which can be seen with hepatic insufficiency, cirrhosis, portosystemic shunt, portal vein thrombosis, total parenteral nutrition, and is less often seen with hyperglycemia or other metabolic disturbances such as Wilson's disease. Correlation with serum ammonia levels and liver function tests is recommended. 3. Advanced but nonspecific cerebral white matter signal changes. Most commonly this is due to chronic small vessel disease. Other considerations include hypercoagulable state, vasculitis, , prior infection or demyelination.   Electronically Signed   By: Genevie Ann M.D.   On: 08/28/2014 19:23   Dg Chest Portable 1 View  08/27/2014   CLINICAL DATA:  Confusion and weakness  EXAM: PORTABLE CHEST - 1 VIEW  COMPARISON:  October 03, 2013  FINDINGS: Port-A-Cath tip is in the superior vena cava. No pneumothorax. There is no appreciable edema or consolidation. Heart is borderline prominent with pulmonary vascularity within normal limits. There is atherosclerotic change in the  aortic arch region. No adenopathy. No bone lesions.  IMPRESSION: No edema or consolidation.   Electronically Signed   By: Lowella Grip III M.D.   On: 08/27/2014 14:27    Microbiology: No results found for this or any previous visit (from the past 240 hour(s)).   Labs: Basic Metabolic Panel:  Recent Labs Lab 09/04/14 1634 09/05/14 0720 09/06/14 0325  NA 136 145 142  K 3.8 3.2* 3.6  CL 99* 105 109  CO2 27  30 28  GLUCOSE 411* 73 49*  BUN 33* 24* 17  CREATININE 1.29* 1.03* 0.98  CALCIUM 9.0 8.9 8.7*   Liver Function Tests:  Recent Labs Lab 09/05/14 0720  AST 16  ALT 15  ALKPHOS 77  BILITOT 1.4*  PROT 5.5*  ALBUMIN 3.1*   No results for input(s): LIPASE, AMYLASE in the last 168 hours. No results for input(s): AMMONIA in the last 168 hours. CBC:  Recent Labs Lab 09/04/14 1634 09/05/14 0720 09/06/14 0325 09/07/14 0441  WBC 8.4 6.3 11.3* 6.2  HGB 6.5* 8.1* 8.3* 7.9*  HCT 21.5* 26.2* 26.4* 25.5*  MCV 79.9 80.6 82.2 83.6  PLT 328 264 266 240   Cardiac Enzymes: No results for input(s): CKTOTAL, CKMB, CKMBINDEX, TROPONINI in the last 168 hours. BNP: BNP (last 3 results) No results for input(s): BNP in the last 8760 hours.  ProBNP (last 3 results)  Recent Labs  10/03/13 1400 07/27/14 1548  PROBNP 155.7* 866.0*    CBG:  Recent Labs Lab 09/06/14 1638 09/06/14 2041 09/06/14 2335 09/07/14 0351 09/07/14 0731  GLUCAP 255* 225* 172* 111* 216*       SignedDebbe Odea, MD [facility] 09/07/2014, 10:19 AM

## 2014-09-10 ENCOUNTER — Other Ambulatory Visit (HOSPITAL_BASED_OUTPATIENT_CLINIC_OR_DEPARTMENT_OTHER): Payer: Medicare Other

## 2014-09-10 DIAGNOSIS — Q2733 Arteriovenous malformation of digestive system vessel: Secondary | ICD-10-CM

## 2014-09-10 DIAGNOSIS — D500 Iron deficiency anemia secondary to blood loss (chronic): Secondary | ICD-10-CM

## 2014-09-10 DIAGNOSIS — I780 Hereditary hemorrhagic telangiectasia: Secondary | ICD-10-CM

## 2014-09-10 DIAGNOSIS — R569 Unspecified convulsions: Secondary | ICD-10-CM | POA: Insufficient documentation

## 2014-09-10 DIAGNOSIS — K31819 Angiodysplasia of stomach and duodenum without bleeding: Secondary | ICD-10-CM

## 2014-09-10 HISTORY — DX: Unspecified convulsions: R56.9

## 2014-09-10 LAB — CBC WITH DIFFERENTIAL/PLATELET
BASO%: 0.6 % (ref 0.0–2.0)
BASOS ABS: 0 10*3/uL (ref 0.0–0.1)
EOS ABS: 0.2 10*3/uL (ref 0.0–0.5)
EOS%: 3.3 % (ref 0.0–7.0)
HCT: 27.1 % — ABNORMAL LOW (ref 34.8–46.6)
HEMOGLOBIN: 8.3 g/dL — AB (ref 11.6–15.9)
LYMPH#: 0.6 10*3/uL — AB (ref 0.9–3.3)
LYMPH%: 9.4 % — ABNORMAL LOW (ref 14.0–49.7)
MCH: 24.5 pg — ABNORMAL LOW (ref 25.1–34.0)
MCHC: 30.7 g/dL — ABNORMAL LOW (ref 31.5–36.0)
MCV: 79.7 fL (ref 79.5–101.0)
MONO#: 0.7 10*3/uL (ref 0.1–0.9)
MONO%: 9.9 % (ref 0.0–14.0)
NEUT#: 5.3 10*3/uL (ref 1.5–6.5)
NEUT%: 76.8 % (ref 38.4–76.8)
PLATELETS: 224 10*3/uL (ref 145–400)
RBC: 3.4 10*6/uL — ABNORMAL LOW (ref 3.70–5.45)
RDW: 17.8 % — AB (ref 11.2–14.5)
WBC: 6.9 10*3/uL (ref 3.9–10.3)

## 2014-09-10 LAB — FERRITIN CHCC: Ferritin: 9 ng/ml (ref 9–269)

## 2014-09-10 LAB — HOLD TUBE, BLOOD BANK

## 2014-09-11 ENCOUNTER — Other Ambulatory Visit: Payer: Self-pay | Admitting: Oncology

## 2014-09-11 ENCOUNTER — Telehealth: Payer: Self-pay | Admitting: *Deleted

## 2014-09-11 DIAGNOSIS — K31811 Angiodysplasia of stomach and duodenum with bleeding: Secondary | ICD-10-CM

## 2014-09-11 DIAGNOSIS — I780 Hereditary hemorrhagic telangiectasia: Secondary | ICD-10-CM

## 2014-09-11 DIAGNOSIS — D509 Iron deficiency anemia, unspecified: Secondary | ICD-10-CM

## 2014-09-11 NOTE — Telephone Encounter (Signed)
Per Dr. [name], I have scheduled appts. He will call the patient

## 2014-09-11 NOTE — Telephone Encounter (Signed)
Pt called / informed red count stable; Hgb @ 8.3; she will not need blood transfusion but [facility]  2 weeks in a row per Dr [name]. Pt voiced understanding; stated she can go anytime/any day.

## 2014-09-11 NOTE — Telephone Encounter (Signed)
Pt called back / stated she wants IV iron done after her colonoscopy. Informed her when she is called to scheduled her appt , to let the person know at that time.

## 2014-09-11 NOTE — Telephone Encounter (Signed)
-----   Message from [name], MD sent at 09/10/2014  5:52 PM EDT ----- Call pt:  Red count stable - no need for blood transfusion this week but she needs IV iron - I will arrange at cancer center then call with day to go there - would like to do 2 weeks in a row

## 2014-09-12 DIAGNOSIS — Z1211 Encounter for screening for malignant neoplasm of colon: Secondary | ICD-10-CM | POA: Diagnosis not present

## 2014-09-12 DIAGNOSIS — D126 Benign neoplasm of colon, unspecified: Secondary | ICD-10-CM | POA: Diagnosis not present

## 2014-09-12 DIAGNOSIS — D123 Benign neoplasm of transverse colon: Secondary | ICD-10-CM | POA: Diagnosis not present

## 2014-09-12 DIAGNOSIS — K5520 Angiodysplasia of colon without hemorrhage: Secondary | ICD-10-CM | POA: Diagnosis not present

## 2014-09-12 NOTE — Telephone Encounter (Signed)
-----   Message from [name], MD sent at 09/11/2014  4:21 PM EDT ----- Call patient: IV iron scheduled at cancer center for 2 doses:  3 PM this Friday 8/5 then 12:30 PM the following Friday 8/12

## 2014-09-12 NOTE — Telephone Encounter (Signed)
Pt had colonoscopy done. Informed of her appts for IV iron on 8/5 @ 1500PM and next Friday 8/12 @ 1230PM at the [place]. States she gets muscle spasms after the iron infusions.

## 2014-09-14 ENCOUNTER — Ambulatory Visit (HOSPITAL_BASED_OUTPATIENT_CLINIC_OR_DEPARTMENT_OTHER): Payer: Medicare Other

## 2014-09-14 VITALS — BP 104/40 | HR 63 | Temp 98.1°F | Resp 16

## 2014-09-14 DIAGNOSIS — K31811 Angiodysplasia of stomach and duodenum with bleeding: Secondary | ICD-10-CM

## 2014-09-14 DIAGNOSIS — Q2733 Arteriovenous malformation of digestive system vessel: Secondary | ICD-10-CM | POA: Diagnosis not present

## 2014-09-14 DIAGNOSIS — I780 Hereditary hemorrhagic telangiectasia: Secondary | ICD-10-CM

## 2014-09-14 DIAGNOSIS — D500 Iron deficiency anemia secondary to blood loss (chronic): Secondary | ICD-10-CM

## 2014-09-14 DIAGNOSIS — D509 Iron deficiency anemia, unspecified: Secondary | ICD-10-CM

## 2014-09-14 DIAGNOSIS — K31819 Angiodysplasia of stomach and duodenum without bleeding: Secondary | ICD-10-CM

## 2014-09-14 MED ORDER — SODIUM CHLORIDE 0.9 % IV SOLN
510.0000 mg | Freq: Once | INTRAVENOUS | Status: AC
  Administered 2014-09-14: 510 mg via INTRAVENOUS
  Filled 2014-09-14: qty 17

## 2014-09-14 MED ORDER — SODIUM CHLORIDE 0.9 % IV SOLN
Freq: Once | INTRAVENOUS | Status: AC
  Administered 2014-09-14: 16:00:00 via INTRAVENOUS

## 2014-09-14 MED ORDER — HEPARIN SOD (PORK) LOCK FLUSH 100 UNIT/ML IV SOLN
500.0000 [IU] | Freq: Once | INTRAVENOUS | Status: AC
  Administered 2014-09-14: 500 [IU] via INTRAVENOUS
  Filled 2014-09-14: qty 5

## 2014-09-14 MED ORDER — SODIUM CHLORIDE 0.9 % IJ SOLN
10.0000 mL | INTRAMUSCULAR | Status: DC | PRN
  Administered 2014-09-14: 10 mL via INTRAVENOUS
  Filled 2014-09-14: qty 10

## 2014-09-14 NOTE — Patient Instructions (Signed)

## 2014-09-14 NOTE — Progress Notes (Signed)
Pt reports being hospitalized two times since last treatment due to issues with obtaining insulin. Pt instructed to get in touch with PCP for an appointment and prescriptions. Pt expressed understanding.

## 2014-09-20 DIAGNOSIS — I5032 Chronic diastolic (congestive) heart failure: Secondary | ICD-10-CM | POA: Diagnosis not present

## 2014-09-20 DIAGNOSIS — E1140 Type 2 diabetes mellitus with diabetic neuropathy, unspecified: Secondary | ICD-10-CM | POA: Diagnosis not present

## 2014-09-20 DIAGNOSIS — D509 Iron deficiency anemia, unspecified: Secondary | ICD-10-CM | POA: Diagnosis not present

## 2014-09-20 DIAGNOSIS — I10 Essential (primary) hypertension: Secondary | ICD-10-CM | POA: Diagnosis not present

## 2014-09-21 ENCOUNTER — Ambulatory Visit (HOSPITAL_BASED_OUTPATIENT_CLINIC_OR_DEPARTMENT_OTHER): Payer: Medicare Other

## 2014-09-21 VITALS — BP 109/50 | HR 63 | Temp 98.2°F | Resp 16

## 2014-09-21 DIAGNOSIS — I780 Hereditary hemorrhagic telangiectasia: Secondary | ICD-10-CM

## 2014-09-21 DIAGNOSIS — D509 Iron deficiency anemia, unspecified: Secondary | ICD-10-CM

## 2014-09-21 DIAGNOSIS — Q2733 Arteriovenous malformation of digestive system vessel: Secondary | ICD-10-CM

## 2014-09-21 DIAGNOSIS — D500 Iron deficiency anemia secondary to blood loss (chronic): Secondary | ICD-10-CM | POA: Diagnosis not present

## 2014-09-21 DIAGNOSIS — K31811 Angiodysplasia of stomach and duodenum with bleeding: Secondary | ICD-10-CM

## 2014-09-21 DIAGNOSIS — K31819 Angiodysplasia of stomach and duodenum without bleeding: Secondary | ICD-10-CM

## 2014-09-21 MED ORDER — SODIUM CHLORIDE 0.9 % IV SOLN
510.0000 mg | Freq: Once | INTRAVENOUS | Status: AC
  Administered 2014-09-21: 510 mg via INTRAVENOUS
  Filled 2014-09-21: qty 17

## 2014-09-21 MED ORDER — SODIUM CHLORIDE 0.9 % IJ SOLN
10.0000 mL | INTRAMUSCULAR | Status: DC | PRN
  Administered 2014-09-21: 10 mL via INTRAVENOUS
  Filled 2014-09-21: qty 10

## 2014-09-21 MED ORDER — HEPARIN SOD (PORK) LOCK FLUSH 100 UNIT/ML IV SOLN
500.0000 [IU] | Freq: Once | INTRAVENOUS | Status: AC
  Administered 2014-09-21: 500 [IU] via INTRAVENOUS
  Filled 2014-09-21: qty 5

## 2014-09-21 MED ORDER — SODIUM CHLORIDE 0.9 % IV SOLN
Freq: Once | INTRAVENOUS | Status: AC
  Administered 2014-09-21: 13:00:00 via INTRAVENOUS

## 2014-09-21 NOTE — Progress Notes (Signed)
Per Dr. [name], no labs today, feraheme only; patient to return 09/24/14 for previously scheduled lab apt. She is informed and verbalizes understanding.

## 2014-09-21 NOTE — Patient Instructions (Signed)

## 2014-09-24 ENCOUNTER — Other Ambulatory Visit (HOSPITAL_BASED_OUTPATIENT_CLINIC_OR_DEPARTMENT_OTHER): Payer: Medicare Other

## 2014-09-24 DIAGNOSIS — D509 Iron deficiency anemia, unspecified: Secondary | ICD-10-CM

## 2014-09-24 DIAGNOSIS — I780 Hereditary hemorrhagic telangiectasia: Secondary | ICD-10-CM

## 2014-09-24 DIAGNOSIS — D500 Iron deficiency anemia secondary to blood loss (chronic): Secondary | ICD-10-CM

## 2014-09-24 DIAGNOSIS — K31811 Angiodysplasia of stomach and duodenum with bleeding: Secondary | ICD-10-CM

## 2014-09-24 DIAGNOSIS — Q2733 Arteriovenous malformation of digestive system vessel: Secondary | ICD-10-CM | POA: Diagnosis not present

## 2014-09-24 DIAGNOSIS — K31819 Angiodysplasia of stomach and duodenum without bleeding: Secondary | ICD-10-CM

## 2014-09-24 LAB — CBC WITH DIFFERENTIAL/PLATELET
BASO%: 0.6 % (ref 0.0–2.0)
BASOS ABS: 0 10*3/uL (ref 0.0–0.1)
EOS%: 4.2 % (ref 0.0–7.0)
Eosinophils Absolute: 0.2 10*3/uL (ref 0.0–0.5)
HCT: 29.6 % — ABNORMAL LOW (ref 34.8–46.6)
HEMOGLOBIN: 9.1 g/dL — AB (ref 11.6–15.9)
LYMPH%: 12.9 % — ABNORMAL LOW (ref 14.0–49.7)
MCH: 27 pg (ref 25.1–34.0)
MCHC: 30.7 g/dL — AB (ref 31.5–36.0)
MCV: 88 fL (ref 79.5–101.0)
MONO#: 0.5 10*3/uL (ref 0.1–0.9)
MONO%: 9.6 % (ref 0.0–14.0)
NEUT#: 3.8 10*3/uL (ref 1.5–6.5)
NEUT%: 72.7 % (ref 38.4–76.8)
Platelets: 197 10*3/uL (ref 145–400)
RBC: 3.36 10*6/uL — ABNORMAL LOW (ref 3.70–5.45)
RDW: 25.3 % — AB (ref 11.2–14.5)
WBC: 5.3 10*3/uL (ref 3.9–10.3)
lymph#: 0.7 10*3/uL — ABNORMAL LOW (ref 0.9–3.3)

## 2014-09-24 LAB — HOLD TUBE, BLOOD BANK

## 2014-09-25 ENCOUNTER — Telehealth: Payer: Self-pay | Admitting: *Deleted

## 2014-09-25 NOTE — Telephone Encounter (Signed)
Per Dr. Azucena Freed assessment of pt's labs, no need for blood transfusion this week

## 2014-09-28 NOTE — Telephone Encounter (Signed)
Pt verbalized understanding of no need for blood transfusion this week and that next lab appt at the cancer center if 10/08/14 at 2:15 PM Yvonna Alanis, RN, 09/28/14, 9:41 AM

## 2014-10-02 ENCOUNTER — Emergency Department (HOSPITAL_COMMUNITY): Payer: Medicare Other

## 2014-10-02 ENCOUNTER — Inpatient Hospital Stay (HOSPITAL_COMMUNITY)
Admission: EM | Admit: 2014-10-02 | Discharge: 2014-10-05 | DRG: 100 | Disposition: A | Discharge status: Home Health | Payer: Medicare Other | Attending: Internal Medicine | Admitting: Internal Medicine

## 2014-10-02 DIAGNOSIS — I517 Cardiomegaly: Secondary | ICD-10-CM | POA: Diagnosis not present

## 2014-10-02 DIAGNOSIS — K31819 Angiodysplasia of stomach and duodenum without bleeding: Secondary | ICD-10-CM | POA: Diagnosis not present

## 2014-10-02 DIAGNOSIS — G40901 Epilepsy, unspecified, not intractable, with status epilepticus: Principal | ICD-10-CM | POA: Diagnosis present

## 2014-10-02 DIAGNOSIS — G9340 Encephalopathy, unspecified: Secondary | ICD-10-CM | POA: Diagnosis present

## 2014-10-02 DIAGNOSIS — I10 Essential (primary) hypertension: Secondary | ICD-10-CM | POA: Diagnosis not present

## 2014-10-02 DIAGNOSIS — N179 Acute kidney failure, unspecified: Secondary | ICD-10-CM | POA: Diagnosis not present

## 2014-10-02 DIAGNOSIS — R4182 Altered mental status, unspecified: Secondary | ICD-10-CM | POA: Diagnosis not present

## 2014-10-02 DIAGNOSIS — Z87891 Personal history of nicotine dependence: Secondary | ICD-10-CM

## 2014-10-02 DIAGNOSIS — E119 Type 2 diabetes mellitus without complications: Secondary | ICD-10-CM | POA: Diagnosis present

## 2014-10-02 DIAGNOSIS — N183 Chronic kidney disease, stage 3 unspecified: Secondary | ICD-10-CM | POA: Diagnosis present

## 2014-10-02 DIAGNOSIS — I5032 Chronic diastolic (congestive) heart failure: Secondary | ICD-10-CM | POA: Diagnosis not present

## 2014-10-02 DIAGNOSIS — E7220 Disorder of urea cycle metabolism, unspecified: Secondary | ICD-10-CM | POA: Diagnosis present

## 2014-10-02 DIAGNOSIS — E785 Hyperlipidemia, unspecified: Secondary | ICD-10-CM | POA: Diagnosis present

## 2014-10-02 DIAGNOSIS — N182 Chronic kidney disease, stage 2 (mild): Secondary | ICD-10-CM

## 2014-10-02 DIAGNOSIS — K219 Gastro-esophageal reflux disease without esophagitis: Secondary | ICD-10-CM | POA: Diagnosis present

## 2014-10-02 DIAGNOSIS — Z79899 Other long term (current) drug therapy: Secondary | ICD-10-CM

## 2014-10-02 DIAGNOSIS — E1140 Type 2 diabetes mellitus with diabetic neuropathy, unspecified: Secondary | ICD-10-CM | POA: Diagnosis not present

## 2014-10-02 DIAGNOSIS — Q2733 Arteriovenous malformation of digestive system vessel: Secondary | ICD-10-CM | POA: Diagnosis not present

## 2014-10-02 DIAGNOSIS — D509 Iron deficiency anemia, unspecified: Secondary | ICD-10-CM | POA: Diagnosis not present

## 2014-10-02 DIAGNOSIS — I129 Hypertensive chronic kidney disease with stage 1 through stage 4 chronic kidney disease, or unspecified chronic kidney disease: Secondary | ICD-10-CM | POA: Diagnosis not present

## 2014-10-02 DIAGNOSIS — E1165 Type 2 diabetes mellitus with hyperglycemia: Secondary | ICD-10-CM | POA: Diagnosis present

## 2014-10-02 DIAGNOSIS — E1129 Type 2 diabetes mellitus with other diabetic kidney complication: Secondary | ICD-10-CM | POA: Diagnosis not present

## 2014-10-02 DIAGNOSIS — Z794 Long term (current) use of insulin: Secondary | ICD-10-CM | POA: Diagnosis not present

## 2014-10-02 DIAGNOSIS — Z888 Allergy status to other drugs, medicaments and biological substances status: Secondary | ICD-10-CM | POA: Diagnosis not present

## 2014-10-02 DIAGNOSIS — K922 Gastrointestinal hemorrhage, unspecified: Secondary | ICD-10-CM | POA: Diagnosis present

## 2014-10-02 DIAGNOSIS — D638 Anemia in other chronic diseases classified elsewhere: Secondary | ICD-10-CM | POA: Diagnosis not present

## 2014-10-02 DIAGNOSIS — I780 Hereditary hemorrhagic telangiectasia: Secondary | ICD-10-CM | POA: Diagnosis present

## 2014-10-02 DIAGNOSIS — E1122 Type 2 diabetes mellitus with diabetic chronic kidney disease: Secondary | ICD-10-CM | POA: Diagnosis present

## 2014-10-02 DIAGNOSIS — J811 Chronic pulmonary edema: Secondary | ICD-10-CM | POA: Diagnosis not present

## 2014-10-02 DIAGNOSIS — R42 Dizziness and giddiness: Secondary | ICD-10-CM | POA: Diagnosis not present

## 2014-10-02 DIAGNOSIS — R40243 Glasgow coma scale score 3-8: Secondary | ICD-10-CM | POA: Diagnosis not present

## 2014-10-02 DIAGNOSIS — G40301 Generalized idiopathic epilepsy and epileptic syndromes, not intractable, with status epilepticus: Secondary | ICD-10-CM | POA: Diagnosis not present

## 2014-10-02 HISTORY — DX: Unspecified convulsions: R56.9

## 2014-10-02 LAB — I-STAT ARTERIAL BLOOD GAS, ED
ACID-BASE EXCESS: 4 mmol/L — AB (ref 0.0–2.0)
Bicarbonate: 27.4 mEq/L — ABNORMAL HIGH (ref 20.0–24.0)
O2 SAT: 97 %
Patient temperature: 97.6
TCO2: 28 mmol/L (ref 0–100)
pCO2 arterial: 35 mmHg (ref 35.0–45.0)
pH, Arterial: 7.499 — ABNORMAL HIGH (ref 7.350–7.450)
pO2, Arterial: 82 mmHg (ref 80.0–100.0)

## 2014-10-02 LAB — URINALYSIS, ROUTINE W REFLEX MICROSCOPIC
BILIRUBIN URINE: NEGATIVE
GLUCOSE, UA: NEGATIVE mg/dL
Hgb urine dipstick: NEGATIVE
Ketones, ur: NEGATIVE mg/dL
LEUKOCYTES UA: NEGATIVE
Nitrite: NEGATIVE
PH: 5.5 (ref 5.0–8.0)
Protein, ur: NEGATIVE mg/dL
SPECIFIC GRAVITY, URINE: 1.019 (ref 1.005–1.030)
Urobilinogen, UA: 0.2 mg/dL (ref 0.0–1.0)

## 2014-10-02 LAB — I-STAT CHEM 8, ED
BUN: 35 mg/dL — AB (ref 6–20)
CREATININE: 1.3 mg/dL — AB (ref 0.44–1.00)
Calcium, Ion: 1.16 mmol/L (ref 1.13–1.30)
Chloride: 103 mmol/L (ref 101–111)
GLUCOSE: 214 mg/dL — AB (ref 65–99)
HEMATOCRIT: 39 % (ref 36.0–46.0)
Hemoglobin: 13.3 g/dL (ref 12.0–15.0)
Potassium: 4 mmol/L (ref 3.5–5.1)
Sodium: 141 mmol/L (ref 135–145)
TCO2: 25 mmol/L (ref 0–100)

## 2014-10-02 LAB — RAPID URINE DRUG SCREEN, HOSP PERFORMED
Amphetamines: NOT DETECTED
BARBITURATES: NOT DETECTED
BENZODIAZEPINES: NOT DETECTED
COCAINE: NOT DETECTED
Opiates: NOT DETECTED
TETRAHYDROCANNABINOL: NOT DETECTED

## 2014-10-02 NOTE — ED Notes (Signed)
Patient presents via EMS.  Family reported she laid down about 4pm   Family reported that she vomited in her sleep, nothing unusual about the vomit.  But patient would not wake up.  Stated she was not acting like herself for the last several days.  Was seen by her MD today and had labsd drawn - results called to husband and was told they were normal   CBG 210

## 2014-10-02 NOTE — ED Provider Notes (Signed)
CSN: [id number]     Arrival date & time 10/02/14  2231 History   First MD Initiated Contact with Patient 10/02/14 2237     Chief Complaint  Patient presents with  . Unresponsive      (Consider location/radiation/quality/duration/timing/severity/associated sxs/prior Treatment) The history is provided by the patient and the EMS personnel. The history is limited by the condition of the patient.  Patient is a 74 year old female with past medical history diabetes, chronic diastolic congestive heart failure, acute encephalopathy, acute lower GI bleed within the last 2-3 months having the transfusions who presents today with altered mental status started today. Patient was noted by family to be increasingly fatigued today. Pain stated to EMS that she went to lay down for a nap and when they checked on her later noted that she vomited and vomited was going down the side of her mouth. Patient has been minimally responsive although she will react to painful stimuli. EMS notes a blood sugar in the 260s. She has been hemodynamically stable in route. No acute changes were noted on the EMS EKG. History is limited due to the patient's altered mental status.  Past Medical History  Diagnosis Date  . Anemia   . S/P endoscopy     with laser treatment  08/19/2006  . History of epistaxis   . Hyperlipidemia   . Telangiectasia     Gastric   . Lichen planus     Both lower extremities  . GERD (gastroesophageal reflux disease)   . Heart murmur   . CHF (congestive heart failure)   . Pneumonia 1990's    "twice" (09/15/2012)  . Type II diabetes mellitus     insulin requiring.  Marland Kitchen History of blood transfusion     "getting 2 today; have had transfusions before" (09/15/2012)  . Family history of anesthesia complication     "niece has a hard time coming out" (09/15/2012)  . Chronic diastolic CHF (congestive heart failure) 10/03/2013  . HTN (hypertension), benign 03/02/2012   Past Surgical History  Procedure Laterality  Date  . Nose surgery      "for bleeding" (09/15/2012)  . Savory dilation  02/26/2011    Procedure: SAVORY DILATION;  Surgeon: [name], MD;  Location: [name];  Service: Endoscopy;  Laterality: N/A;  c-arm needed  . Esophagogastroduodenoscopy  02/26/2011    Procedure: ESOPHAGOGASTRODUODENOSCOPY (EGD);  Surgeon: [name], MD;  Location: [name];  Service: Endoscopy;  Laterality: N/A;  . Esophagogastroduodenoscopy N/A 11/08/2012    Procedure: ESOPHAGOGASTRODUODENOSCOPY (EGD);  Surgeon: [name], MD;  Location: [name];  Service: Endoscopy;  Laterality: N/A;  . Cataract surgery    . Esophagogastroduodenoscopy N/A 10/04/2013    Procedure: ESOPHAGOGASTRODUODENOSCOPY (EGD);  Surgeon: Winfield Cunas., MD;  Location: [name];  Service: Endoscopy;  Laterality: N/A;  with APC on stand-by  . Esophagogastroduodenoscopy N/A 07/06/2014    Procedure: ESOPHAGOGASTRODUODENOSCOPY (EGD);  Surgeon: Clarene Essex, MD;  Location: [name];  Service: Endoscopy;  Laterality: N/A;  . Hot hemostasis N/A 07/06/2014    Procedure: HOT HEMOSTASIS (ARGON PLASMA COAGULATION/BICAP);  Surgeon: Clarene Essex, MD;  Location: [name];  Service: Endoscopy;  Laterality: N/A;  . Esophagogastroduodenoscopy N/A 09/05/2014    Procedure: ESOPHAGOGASTRODUODENOSCOPY (EGD);  Surgeon: Laurence Spates, MD;  Location: [name];  Service: Endoscopy;  Laterality: N/A;  APC on standby to control bleeding   Family History  Problem Relation Age of Onset  . Breast cancer    . Malignant hyperthermia Neg Hx   .  Stroke Father    Social History  Substance Use Topics  . Smoking status: Former Smoker -- 2 years    Types: Cigarettes    Quit date: 02/10/1971  . Smokeless tobacco: Never Used     Comment: 09/15/2012 "smoked 50-60 yr ago"  . Alcohol Use: No   OB History    No data available     Review of Systems  Unable to perform ROS: Mental status change  Constitutional: Positive for fatigue (over last few  days per family). Negative for fever.  Psychiatric/Behavioral: Positive for confusion and decreased concentration.      Allergies  Aspirin  Home Medications   Prior to Admission medications   Medication Sig Start Date End Date Taking? Authorizing Provider  AMITIZA 24 MCG capsule Take 24 mcg by mouth 2 (two) times daily. 08/29/14   Historical Provider, MD  cyclobenzaprine (FLEXERIL) 5 MG tablet Take 1 tablet (5 mg total) by mouth 3 (three) times daily as needed for muscle spasms. 08/28/14   Robbie Lis, MD  ferrous fumarate (HEMOCYTE - 106 MG FE) 325 (106 FE) MG TABS tablet Take 1 tablet by mouth daily.    Historical Provider, MD  furosemide (LASIX) 40 MG tablet Take 40mg  twice daily for two days and then start taking 40mg  once daily. Patient taking differently: Take 40 mg by mouth daily.  07/30/14   [name], MD  insulin detemir (LEVEMIR) 100 UNIT/ML injection Inject 0.3 mLs (30 Units total) into the skin at bedtime. Patient taking differently: Inject 30-40 Units into the skin 2 (two) times daily. 40 units in the Morning, and 30 units at Bedtime 10/05/13   Robbie Lis, MD  metoprolol succinate (TOPROL XL) 25 MG 24 hr tablet Take 0.5 tablets (12.5 mg total) by mouth daily. 09/07/14   Debbe Odea, MD  oxymetazoline (AFRIN) 0.05 % nasal spray Place 1 spray into both nostrils 2 (two) times daily as needed (Epistaxis). 10/05/13   Robbie Lis, MD  pantoprazole (PROTONIX) 40 MG tablet Take 1 tablet (40 mg total) by mouth 2 (two) times daily. 07/07/14   Hosie Poisson, MD  vitamin B-12 (CYANOCOBALAMIN) 1000 MCG tablet Take 1,000 mcg by mouth at bedtime.     Historical Provider, MD  Vitamin D, Ergocalciferol, (DRISDOL) 50000 UNITS CAPS Take 50,000 Units by mouth every 7 (seven) days. Takes on Monday    Historical Provider, MD   BP 151/47 mmHg  Pulse 57  Temp(Src) 97.6 F (36.4 C) (Rectal)  Resp 12  SpO2 100% Physical Exam  Constitutional: She is uncooperative. She is easily aroused. No  distress.  HENT:  Head: Normocephalic and atraumatic.  Eyes: Conjunctivae and EOM are normal.  Neck: Normal range of motion. Neck supple.  Cardiovascular: Normal rate, regular rhythm and normal heart sounds.   No murmur heard. Pulmonary/Chest: Effort normal and breath sounds normal.  Abdominal: Soft. Bowel sounds are normal. There is no tenderness. There is no rebound and no guarding.  Musculoskeletal: Normal range of motion. She exhibits no tenderness.  Neurological: She is easily aroused. She is disoriented. GCS eye subscore is 1. GCS verbal subscore is 3. GCS motor subscore is 5.  Skin: Skin is warm and dry. No rash noted. She is not diaphoretic. No pallor.  Nursing note and vitals reviewed.   ED Course  Procedures (including critical care time) Labs Review Labs Reviewed  CBC WITH DIFFERENTIAL/PLATELET - Abnormal; Notable for the following:    RDW 20.0 (*)    Neutrophils Relative %  79 (*)    All other components within normal limits  BASIC METABOLIC PANEL - Abnormal; Notable for the following:    Glucose, Bld 209 (*)    BUN 32 (*)    Creatinine, Ser 1.38 (*)    GFR calc non Af Amer 37 (*)    GFR calc Af Amer 42 (*)    All other components within normal limits  AMMONIA - Abnormal; Notable for the following:    Ammonia 327 (*)    All other components within normal limits  I-STAT CHEM 8, ED - Abnormal; Notable for the following:    BUN 35 (*)    Creatinine, Ser 1.30 (*)    Glucose, Bld 214 (*)    All other components within normal limits  I-STAT ARTERIAL BLOOD GAS, ED - Abnormal; Notable for the following:    pH, Arterial 7.499 (*)    Bicarbonate 27.4 (*)    Acid-Base Excess 4.0 (*)    All other components within normal limits  CULTURE, BLOOD (ROUTINE X 2)  CULTURE, BLOOD (ROUTINE X 2)  URINALYSIS, ROUTINE W REFLEX MICROSCOPIC (NOT AT [facility])  URINE RAPID DRUG SCREEN, HOSP PERFORMED  ETHANOL  CBG MONITORING, ED  I-STAT TROPOININ, ED    Imaging Review Ct Head Wo  Contrast  10/03/2014   CLINICAL DATA:  Altered mental status.  Lethargy.  EXAM: CT HEAD WITHOUT CONTRAST  TECHNIQUE: Contiguous axial images were obtained from the base of the skull through the vertex without intravenous contrast.  COMPARISON:  Head CT 08/27/2014, brain MRI 08/28/2014  FINDINGS: No intracranial hemorrhage, mass effect, or midline shift. No hydrocephalus. The basilar cisterns are patent. No evidence of territorial infarct. No intracranial fluid collection. Generalized atrophy and chronic small vessel ischemic change, stable from prior exam. Calvarium is intact. Included paranasal sinuses and mastoid air cells are well aerated. Previous opacification of the right maxillary sinus is no longer seen.  IMPRESSION: Atrophy and chronic small vessel ischemic change. No acute intracranial abnormality.   Electronically Signed   By: Jeb Levering M.D.   On: 10/03/2014 00:25   Dg Chest Portable 1 View  10/02/2014   CLINICAL DATA:  Altered mental status. Tired. Difficult to wake. History of diabetes and hypertension.  EXAM: PORTABLE CHEST - 1 VIEW  COMPARISON:  08/27/2014  FINDINGS: Shallow inspiration. Power port type central venous catheter with tip over the low SVC. No pneumothorax. Cardiac enlargement with mild pulmonary vascular congestion. No edema or consolidation. No blunting of costophrenic angles. No pneumothorax. Calcified and tortuous aorta.  IMPRESSION: Cardiac enlargement with mild pulmonary vascular congestion. No edema or consolidation.   Electronically Signed   By: Lucienne Capers M.D.   On: 10/02/2014 23:20   I have personally reviewed and evaluated these images and lab results as part of my medical decision-making.   EKG Interpretation None      MDM   Final diagnoses:  Altered mental status, unspecified altered mental status type    Patient is a 74 year old female with past medical history diabetes, chronic diastolic congestive heart failure, acute encephalopathy, acute  lower GI bleed within the last 2-3 months having the transfusions who presents today with altered mental status started today. Patient's ammonia was notably 327 which is likely culprit in patient's abrupt change in mental status. No clear documentation of liver failure in the past. No obvious acute intracranial abnml. No clear signs of infection, afebrile, urine clean, no opacities on CXR, no obvious skin changes. No obvious electrolyte abnormality. H/H  is stable today, no obvious rectal bleeding (pt has history). Pt warrants admission for further workup, likely in stepdown unit. Discussed with hospitalist who will admit to stepdown unit. Ordered a per rectum lactulose per hospitalist recommendation. Family updated on plan and findings today.    Theodosia Quay, MD 10/03/14 [id number]  Serita Grit, MD 10/03/14 2045

## 2014-10-03 ENCOUNTER — Inpatient Hospital Stay (HOSPITAL_COMMUNITY): Payer: Medicare Other

## 2014-10-03 ENCOUNTER — Encounter (HOSPITAL_COMMUNITY): Payer: Self-pay | Admitting: *Deleted

## 2014-10-03 DIAGNOSIS — E1129 Type 2 diabetes mellitus with other diabetic kidney complication: Secondary | ICD-10-CM | POA: Diagnosis not present

## 2014-10-03 DIAGNOSIS — I780 Hereditary hemorrhagic telangiectasia: Secondary | ICD-10-CM | POA: Diagnosis present

## 2014-10-03 DIAGNOSIS — N183 Chronic kidney disease, stage 3 unspecified: Secondary | ICD-10-CM | POA: Insufficient documentation

## 2014-10-03 DIAGNOSIS — D638 Anemia in other chronic diseases classified elsewhere: Secondary | ICD-10-CM | POA: Diagnosis present

## 2014-10-03 DIAGNOSIS — G9340 Encephalopathy, unspecified: Secondary | ICD-10-CM

## 2014-10-03 DIAGNOSIS — N179 Acute kidney failure, unspecified: Secondary | ICD-10-CM | POA: Diagnosis not present

## 2014-10-03 DIAGNOSIS — K31819 Angiodysplasia of stomach and duodenum without bleeding: Secondary | ICD-10-CM | POA: Diagnosis present

## 2014-10-03 DIAGNOSIS — Z794 Long term (current) use of insulin: Secondary | ICD-10-CM | POA: Diagnosis not present

## 2014-10-03 DIAGNOSIS — D509 Iron deficiency anemia, unspecified: Secondary | ICD-10-CM | POA: Diagnosis present

## 2014-10-03 DIAGNOSIS — Q2733 Arteriovenous malformation of digestive system vessel: Secondary | ICD-10-CM | POA: Diagnosis not present

## 2014-10-03 DIAGNOSIS — E7220 Disorder of urea cycle metabolism, unspecified: Secondary | ICD-10-CM | POA: Diagnosis present

## 2014-10-03 DIAGNOSIS — Z79899 Other long term (current) drug therapy: Secondary | ICD-10-CM | POA: Diagnosis not present

## 2014-10-03 DIAGNOSIS — G40901 Epilepsy, unspecified, not intractable, with status epilepticus: Secondary | ICD-10-CM | POA: Diagnosis not present

## 2014-10-03 DIAGNOSIS — E1165 Type 2 diabetes mellitus with hyperglycemia: Secondary | ICD-10-CM

## 2014-10-03 DIAGNOSIS — Z888 Allergy status to other drugs, medicaments and biological substances status: Secondary | ICD-10-CM | POA: Diagnosis not present

## 2014-10-03 DIAGNOSIS — I129 Hypertensive chronic kidney disease with stage 1 through stage 4 chronic kidney disease, or unspecified chronic kidney disease: Secondary | ICD-10-CM | POA: Diagnosis present

## 2014-10-03 DIAGNOSIS — K219 Gastro-esophageal reflux disease without esophagitis: Secondary | ICD-10-CM | POA: Diagnosis present

## 2014-10-03 DIAGNOSIS — Z87891 Personal history of nicotine dependence: Secondary | ICD-10-CM | POA: Diagnosis not present

## 2014-10-03 DIAGNOSIS — N182 Chronic kidney disease, stage 2 (mild): Secondary | ICD-10-CM

## 2014-10-03 DIAGNOSIS — I5032 Chronic diastolic (congestive) heart failure: Secondary | ICD-10-CM | POA: Diagnosis not present

## 2014-10-03 DIAGNOSIS — G40301 Generalized idiopathic epilepsy and epileptic syndromes, not intractable, with status epilepticus: Secondary | ICD-10-CM | POA: Diagnosis not present

## 2014-10-03 DIAGNOSIS — E785 Hyperlipidemia, unspecified: Secondary | ICD-10-CM | POA: Diagnosis present

## 2014-10-03 DIAGNOSIS — E1122 Type 2 diabetes mellitus with diabetic chronic kidney disease: Secondary | ICD-10-CM | POA: Diagnosis present

## 2014-10-03 LAB — CBC
HCT: 35.5 % — ABNORMAL LOW (ref 36.0–46.0)
HCT: 35.2 % — ABNORMAL LOW (ref 36.0–46.0)
HEMATOCRIT: 35.6 % — AB (ref 36.0–46.0)
HEMATOCRIT: 35.1 % — AB (ref 36.0–46.0)
HEMATOCRIT: 38 % (ref 36.0–46.0)
HEMOGLOBIN: 11.8 g/dL — AB (ref 12.0–15.0)
HEMOGLOBIN: 12.5 g/dL (ref 12.0–15.0)
Hemoglobin: 11.6 g/dL — ABNORMAL LOW (ref 12.0–15.0)
Hemoglobin: 11.5 g/dL — ABNORMAL LOW (ref 12.0–15.0)
Hemoglobin: 11.4 g/dL — ABNORMAL LOW (ref 12.0–15.0)
MCH: 28.2 pg (ref 26.0–34.0)
MCH: 28.8 pg (ref 26.0–34.0)
MCH: 28.5 pg (ref 26.0–34.0)
MCH: 28.3 pg (ref 26.0–34.0)
MCH: 28 pg (ref 26.0–34.0)
MCHC: 32.6 g/dL (ref 30.0–36.0)
MCHC: 33.6 g/dL (ref 30.0–36.0)
MCHC: 32.9 g/dL (ref 30.0–36.0)
MCHC: 32.4 g/dL (ref 30.0–36.0)
MCHC: 32.4 g/dL (ref 30.0–36.0)
MCV: 86.4 fL (ref 78.0–100.0)
MCV: 85.6 fL (ref 78.0–100.0)
MCV: 86.6 fL (ref 78.0–100.0)
MCV: 87.2 fL (ref 78.0–100.0)
MCV: 86.5 fL (ref 78.0–100.0)
PLATELETS: 245 10*3/uL (ref 150–400)
PLATELETS: 157 10*3/uL (ref 150–400)
Platelets: 244 10*3/uL (ref 150–400)
Platelets: 232 10*3/uL (ref 150–400)
Platelets: 224 10*3/uL (ref 150–400)
RBC: 4.12 MIL/uL (ref 3.87–5.11)
RBC: 4.1 MIL/uL (ref 3.87–5.11)
RBC: 4.39 MIL/uL (ref 3.87–5.11)
RBC: 4.07 MIL/uL (ref 3.87–5.11)
RBC: 4.07 MIL/uL (ref 3.87–5.11)
RDW: 19.8 % — AB (ref 11.5–15.5)
RDW: 19.9 % — AB (ref 11.5–15.5)
RDW: 19.9 % — AB (ref 11.5–15.5)
RDW: 19.8 % — ABNORMAL HIGH (ref 11.5–15.5)
RDW: 19.9 % — AB (ref 11.5–15.5)
WBC: 5.9 10*3/uL (ref 4.0–10.5)
WBC: 5.8 10*3/uL (ref 4.0–10.5)
WBC: 7.7 10*3/uL (ref 4.0–10.5)
WBC: 5.3 10*3/uL (ref 4.0–10.5)
WBC: 4.1 10*3/uL (ref 4.0–10.5)

## 2014-10-03 LAB — COMPREHENSIVE METABOLIC PANEL
ALBUMIN: 3.6 g/dL (ref 3.5–5.0)
ALT: 23 U/L (ref 14–54)
ANION GAP: 10 (ref 5–15)
AST: 25 U/L (ref 15–41)
Alkaline Phosphatase: 116 U/L (ref 38–126)
BUN: 31 mg/dL — AB (ref 6–20)
CHLORIDE: 102 mmol/L (ref 101–111)
CO2: 26 mmol/L (ref 22–32)
Calcium: 9.5 mg/dL (ref 8.9–10.3)
Creatinine, Ser: 1.26 mg/dL — ABNORMAL HIGH (ref 0.44–1.00)
GFR calc Af Amer: 47 mL/min — ABNORMAL LOW (ref >60)
GFR, EST NON AFRICAN AMERICAN: 41 mL/min — AB (ref >60)
GLUCOSE: 216 mg/dL — AB (ref 65–99)
POTASSIUM: 3.8 mmol/L (ref 3.5–5.1)
Sodium: 138 mmol/L (ref 135–145)
TOTAL PROTEIN: 6.7 g/dL (ref 6.5–8.1)
Total Bilirubin: 0.7 mg/dL (ref 0.3–1.2)

## 2014-10-03 LAB — BASIC METABOLIC PANEL
Anion gap: 13 (ref 5–15)
BUN: 32 mg/dL — AB (ref 6–20)
CHLORIDE: 103 mmol/L (ref 101–111)
CO2: 23 mmol/L (ref 22–32)
CREATININE: 1.38 mg/dL — AB (ref 0.44–1.00)
Calcium: 9.7 mg/dL (ref 8.9–10.3)
GFR calc non Af Amer: 37 mL/min — ABNORMAL LOW (ref >60)
GFR, EST AFRICAN AMERICAN: 42 mL/min — AB (ref >60)
GLUCOSE: 209 mg/dL — AB (ref 65–99)
Potassium: 4.2 mmol/L (ref 3.5–5.1)
Sodium: 139 mmol/L (ref 135–145)

## 2014-10-03 LAB — CBC WITH DIFFERENTIAL/PLATELET
Basophils Absolute: 0 10*3/uL (ref 0.0–0.1)
Basophils Relative: 0 % (ref 0–1)
Eosinophils Absolute: 0.1 10*3/uL (ref 0.0–0.7)
Eosinophils Relative: 2 % (ref 0–5)
HEMATOCRIT: 37.4 % (ref 36.0–46.0)
HEMOGLOBIN: 12.2 g/dL (ref 12.0–15.0)
LYMPHS ABS: 0.7 10*3/uL (ref 0.7–4.0)
LYMPHS PCT: 12 % (ref 12–46)
MCH: 28.6 pg (ref 26.0–34.0)
MCHC: 32.6 g/dL (ref 30.0–36.0)
MCV: 87.6 fL (ref 78.0–100.0)
MONO ABS: 0.4 10*3/uL (ref 0.1–1.0)
MONOS PCT: 7 % (ref 3–12)
NEUTROS ABS: 4.8 10*3/uL (ref 1.7–7.7)
NEUTROS PCT: 79 % — AB (ref 43–77)
Platelets: 214 10*3/uL (ref 150–400)
RBC: 4.27 MIL/uL (ref 3.87–5.11)
RDW: 20 % — AB (ref 11.5–15.5)
WBC: 6.1 10*3/uL (ref 4.0–10.5)

## 2014-10-03 LAB — HEPATIC FUNCTION PANEL
ALK PHOS: 115 U/L (ref 38–126)
ALT: 23 U/L (ref 14–54)
AST: 26 U/L (ref 15–41)
Albumin: 3.5 g/dL (ref 3.5–5.0)
BILIRUBIN DIRECT: 0.1 mg/dL (ref 0.1–0.5)
BILIRUBIN INDIRECT: 0.5 mg/dL (ref 0.3–0.9)
BILIRUBIN TOTAL: 0.6 mg/dL (ref 0.3–1.2)
Total Protein: 6.5 g/dL (ref 6.5–8.1)

## 2014-10-03 LAB — ACETAMINOPHEN LEVEL: Acetaminophen (Tylenol), Serum: <10 ug/mL — ABNORMAL LOW (ref 10–30)

## 2014-10-03 LAB — GLUCOSE, CAPILLARY
GLUCOSE-CAPILLARY: 197 mg/dL — AB (ref 65–99)
GLUCOSE-CAPILLARY: 221 mg/dL — AB (ref 65–99)
GLUCOSE-CAPILLARY: 114 mg/dL — AB (ref 65–99)
GLUCOSE-CAPILLARY: 65 mg/dL (ref 65–99)

## 2014-10-03 LAB — PROTIME-INR
INR: 1.16 (ref 0.00–1.49)
Prothrombin Time: 14.9 seconds (ref 11.6–15.2)

## 2014-10-03 LAB — CREATININE, URINE, RANDOM: CREATININE, URINE: 140.6 mg/dL

## 2014-10-03 LAB — I-STAT TROPONIN, ED: Troponin i, poc: 0.01 ng/mL (ref 0.00–0.08)

## 2014-10-03 LAB — BRAIN NATRIURETIC PEPTIDE: B Natriuretic Peptide: 100 pg/mL (ref 0.0–100.0)

## 2014-10-03 LAB — AMMONIA: Ammonia: 327 umol/L — ABNORMAL HIGH (ref 9–35)

## 2014-10-03 LAB — ETHANOL

## 2014-10-03 LAB — MRSA PCR SCREENING: MRSA by PCR: NEGATIVE

## 2014-10-03 LAB — APTT: aPTT: 28 seconds (ref 24–37)

## 2014-10-03 MED ORDER — LACTULOSE ENEMA
300.0000 mL | Freq: Once | ORAL | Status: DC
  Filled 2014-10-03: qty 300

## 2014-10-03 MED ORDER — SODIUM CHLORIDE 0.9 % IV SOLN
500.0000 mg | Freq: Two times a day (BID) | INTRAVENOUS | Status: DC
  Administered 2014-10-03 – 2014-10-05 (×3): 500 mg via INTRAVENOUS
  Filled 2014-10-03 (×6): qty 5

## 2014-10-03 MED ORDER — LORAZEPAM 2 MG/ML IJ SOLN
INTRAMUSCULAR | Status: AC
  Administered 2014-10-03: 2 mg
  Filled 2014-10-03: qty 1

## 2014-10-03 MED ORDER — INSULIN DETEMIR 100 UNIT/ML ~~LOC~~ SOLN
20.0000 [IU] | Freq: Every day | SUBCUTANEOUS | Status: DC
  Administered 2014-10-04: 20 [IU] via SUBCUTANEOUS
  Filled 2014-10-03 (×4): qty 0.2

## 2014-10-03 MED ORDER — SODIUM CHLORIDE 0.9 % IV SOLN
INTRAVENOUS | Status: DC
  Administered 2014-10-03 – 2014-10-05 (×3): via INTRAVENOUS

## 2014-10-03 MED ORDER — PANTOPRAZOLE SODIUM 40 MG IV SOLR
40.0000 mg | INTRAVENOUS | Status: DC
  Administered 2014-10-03 – 2014-10-04 (×3): 40 mg via INTRAVENOUS
  Filled 2014-10-03 (×4): qty 40

## 2014-10-03 MED ORDER — CETYLPYRIDINIUM CHLORIDE 0.05 % MT LIQD
7.0000 mL | Freq: Two times a day (BID) | OROMUCOSAL | Status: DC
  Administered 2014-10-03 – 2014-10-04 (×2): 7 mL via OROMUCOSAL

## 2014-10-03 MED ORDER — HYDRALAZINE HCL 20 MG/ML IJ SOLN: 5.0000 mg | INTRAMUSCULAR | Status: DC | PRN

## 2014-10-03 MED ORDER — LORAZEPAM 2 MG/ML IJ SOLN: 1.0000 mg | Freq: Once | INTRAMUSCULAR | Status: AC

## 2014-10-03 MED ORDER — CHLORHEXIDINE GLUCONATE 0.12 % MT SOLN
15.0000 mL | Freq: Two times a day (BID) | OROMUCOSAL | Status: DC
  Administered 2014-10-05: 15 mL via OROMUCOSAL
  Filled 2014-10-03 (×2): qty 15

## 2014-10-03 MED ORDER — INSULIN ASPART 100 UNIT/ML ~~LOC~~ SOLN
0.0000 [IU] | Freq: Three times a day (TID) | SUBCUTANEOUS | Status: DC
  Administered 2014-10-03 (×2): 3 [IU] via SUBCUTANEOUS
  Administered 2014-10-04: 2 [IU] via SUBCUTANEOUS
  Administered 2014-10-04 – 2014-10-05 (×2): 5 [IU] via SUBCUTANEOUS

## 2014-10-03 MED ORDER — LACTULOSE ENEMA
300.0000 mL | Freq: Two times a day (BID) | ORAL | Status: DC
  Administered 2014-10-03 – 2014-10-05 (×3): 300 mL via RECTAL
  Filled 2014-10-03 (×7): qty 300

## 2014-10-03 MED ORDER — HEPARIN SODIUM (PORCINE) 5000 UNIT/ML IJ SOLN
5000.0000 [IU] | Freq: Three times a day (TID) | INTRAMUSCULAR | Status: DC
  Administered 2014-10-03 – 2014-10-05 (×7): 5000 [IU] via SUBCUTANEOUS
  Filled 2014-10-03 (×10): qty 1

## 2014-10-03 MED ORDER — METOPROLOL TARTRATE 1 MG/ML IV SOLN
5.0000 mg | Freq: Four times a day (QID) | INTRAVENOUS | Status: DC
  Administered 2014-10-03 – 2014-10-04 (×4): 5 mg via INTRAVENOUS
  Filled 2014-10-03 (×4): qty 5

## 2014-10-03 MED ORDER — METOPROLOL TARTRATE 1 MG/ML IV SOLN
5.0000 mg | Freq: Three times a day (TID) | INTRAVENOUS | Status: DC
  Administered 2014-10-03: 5 mg via INTRAVENOUS
  Filled 2014-10-03 (×4): qty 5

## 2014-10-03 MED ORDER — ONDANSETRON HCL 4 MG/2ML IJ SOLN
4.0000 mg | Freq: Three times a day (TID) | INTRAMUSCULAR | Status: DC | PRN
  Administered 2014-10-05: 4 mg via INTRAVENOUS
  Filled 2014-10-03: qty 2

## 2014-10-03 MED ORDER — SODIUM CHLORIDE 0.9 % IV SOLN
1000.0000 mg | Freq: Once | INTRAVENOUS | Status: AC
  Administered 2014-10-03: 1000 mg via INTRAVENOUS
  Filled 2014-10-03: qty 10

## 2014-10-03 MED ORDER — SODIUM CHLORIDE 0.9 % IJ SOLN
3.0000 mL | Freq: Two times a day (BID) | INTRAMUSCULAR | Status: DC
  Administered 2014-10-03 (×3): 3 mL via INTRAVENOUS

## 2014-10-03 NOTE — Progress Notes (Addendum)
Patient ID: [name], female   DOB: 07-05-1940, 74 y.o.   MRN: [id number] [facility] PROGRESS NOTE  [name] [id number] DOB: 01/27/1941 DOA: 10/02/2014 PCP: Philis Fendt, MD  Brief narrative:    75 y.o. 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 Patient was recently hospitalized for generalized weakness thought to be secondary to acute blood loss anemia from iron deficiency anemia (discharge 09/07/2014, 3 day hospital stay).   Patient presented this time because of altered mental status, more lethargy as per her family reports. Apparently there was a questionable seizure episode as well prior to the admission. In addition patient's husband reported that patient had episode of vomiting.  Patient was hemodynamically stable on the admission. Her labwork was significant for ammonia level of 327, creatinine 1.3 (baseline from recent admission is 1.29). CT head did not show acute intracranial abnormalities. Chest x-ray showed mild pulmonary vascular congestion but no edema or consolidation. Patient was admitted for further evaluation of altered mental status. Neurology has seen the patient in consultation.  Assessment/Plan:    Principal problem: Acute encephalopathy / seizure disorder - Patient presented with altered mental status. Etiology is not entirely clear. No reports of seizures since the admission. - Patient's ammonia level was 327 but her liver function enzymes are within normal limits. Patient was started on lactulose. - Alcohol level was within normal limits. - CT head did not show acute intracranial findings. - Neurology has seen the patient in consultation. Plan is for EEG today. - Continue Keppra 500  mg IV every 12 hours. Patient was given 1000 mg IV Keppra loading dose. - Patient is hemodynamically stable and may be transferred to telemetry floor today. - Continue neuro checks per floor protocol. - Appreciate SLP evaluation. Keep nothing by mouth until SLP evaluation completed.  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 - No current indications for transfusion. - Continue to monitor CBC. - Of note, patient is status post EGD on 09/05/2014 which demonstrated a large AVM with clot that was cauterized  DM (diabetes mellitus), type 2, uncontrolled, with renal complications - Continue Levemir 20 units at bedtime along with sliding scale insulin - CBG 197 - HgA1c in 08/2014 7.3   Chronic kidney disease, stage III - Baseline creatinine 1.29 and on this admission 1.3 so around patient's baseline range - Continue to monitor BMP  Essential hypertension - Continue metoprolol 5 mg IV every 8 hours - Patient is on enalapril, Norvasc, Lasix and metoprolol at home for blood pressure control - We will make necessary adjustment to blood pressure medication regimen if needed at the time of discharge  Chronic diastolic CHF (congestive heart failure) - 2-D echo on 07/06/14 showed EF 65-70% with grade 2 diastolic dysfunction.  - Compensated - Mild pulmonary vascular congestion seen on chest x-ray but no edema. - Lasix on hold due to renal insufficiency.    DVT Prophylaxis - Heparin subcutaneous ordered   Code Status: Full.  Family Communication:  family not at the bedside this am  Disposition Plan: Needs physical therapy evaluation  IV access:  Peripheral IV  Procedures and diagnostic studies:    Ct Head Wo Contrast 10/03/2014   Atrophy and chronic small vessel  ischemic change. No acute intracranial abnormality.   Electronically Signed   By: Jeb Levering M.D.   On: 10/03/2014  00:25   US Renal 10/03/2014   Normal sonographic appearance of the kidneys. No obstructive uropathy. Urinary bladder decompressed and not evaluated.   Electronically Signed   By: Jeb Levering M.D.   On: 10/03/2014 02:23   Dg Chest Portable 1 View 10/02/2014   Cardiac enlargement with mild pulmonary vascular congestion. No edema or consolidation.   Electronically Signed   By: Lucienne Capers M.D.   On: 10/02/2014 23:20    Medical Consultants:  Neurology  Other Consultants:  Physical therapy Nutrition  IAnti-Infectives:   None    Leisa Lenz, MD  [facility] Pager [phone]   If 7PM-7AM, please contact night-coverage [email or web address] Password [facility] 10/03/2014, 9:50 AM   LOS: 0 days    HPI/Subjective: No acute overnight events. No respiratory distress. No reports of seizures.   Objective: Filed Vitals:   10/03/14 0200 10/03/14 0242 10/03/14 0400 10/03/14 0800  BP: 142/51 154/46 146/47 155/73  Pulse: 56 73 56 142  Temp:  97 F (36.1 C)  97.5 F (36.4 C)  TempSrc:  Axillary  Axillary  Resp: 13 13 12 25   Height:  5\' 1"  (1.549 m)    Weight:  62.007 kg (136 lb 11.2 oz)    SpO2: 100% 100% 99% 100%    Intake/Output Summary (Last 24 hours) at 10/03/14 0950 Last data filed at 10/03/14 0500  Gross per 24 hour  Intake    100 ml  Output      0 ml  Net    100 ml    Exam:   General:  Pt is not in acute distress  Cardiovascular: Regular rate and rhythm, S1/S2 appreciated   Respiratory: Clear to auscultation bilaterally, no wheezing, no crackles, no rhonchi  Abdomen: Soft, non tender, non distended, bowel sounds present  Extremities: No edema, pulses DP and PT palpable bilaterally  Neuro: Grossly nonfocal  Data Reviewed: Basic Metabolic Panel:  Recent Labs Lab 10/02/14 2348 10/02/14 2358 10/03/14 0321  NA 139 141 138  K 4.2 4.0 3.8  CL 103 103 102  CO2 23  --  26  GLUCOSE 209* 214* 216*  BUN 32* 35* 31*  CREATININE 1.38* 1.30* 1.26*  CALCIUM  9.7  --  9.5   Liver Function Tests:  Recent Labs Lab 10/03/14 0145 10/03/14 0321  AST 26 25  ALT 23 23  ALKPHOS 115 116  BILITOT 0.6 0.7  PROT 6.5 6.7  ALBUMIN 3.5 3.6   No results for input(s): LIPASE, AMYLASE in the last 168 hours.  Recent Labs Lab 10/02/14 2348  AMMONIA 327*   CBC:  Recent Labs Lab 10/02/14 2348 10/02/14 2358 10/03/14 0145 10/03/14 0321  WBC 6.1  --  5.9 5.8  NEUTROABS 4.8  --   --   --   HGB 12.2 13.3 11.6* 11.8*  HCT 37.4 39.0 35.6* 35.1*  MCV 87.6  --  86.4 85.6  PLT 214  --  244 232   Cardiac Enzymes: No results for input(s): CKTOTAL, CKMB, CKMBINDEX, TROPONINI in the last 168 hours. BNP: Invalid input(s): POCBNP CBG:  Recent Labs Lab 10/03/14 0456  GLUCAP 197*    Recent Results (from the past 240 hour(s))  MRSA PCR Screening     Status: None   Collection Time: 10/03/14  2:39 AM  Result Value Ref Range Status   MRSA by PCR NEGATIVE NEGATIVE Final  Comment:        The GeneXpert MRSA Assay (FDA approved for NASAL specimens only), is one component of a comprehensive MRSA colonization surveillance program. It is not intended to diagnose MRSA infection nor to guide or monitor treatment for MRSA infections.      Scheduled Meds: . heparin  5,000 Units Subcutaneous 3 times per day  . insulin aspart  0-9 Units Subcutaneous TID WC  . insulin detemir  20 Units Subcutaneous QHS  . lactulose  300 mL Rectal BID  . levETIRAcetam  1,000 mg Intravenous Once  . levETIRAcetam  500 mg Intravenous Q12H  . metoprolol  5 mg Intravenous 3 times per day  . pantoprazole (PROTONIX) IV  40 mg Intravenous Q24H  . sodium chloride  3 mL Intravenous Q12H   Continuous Infusions: . sodium chloride 50 mL/hr at 10/03/14 0325

## 2014-10-03 NOTE — Progress Notes (Signed)
Report received for transfer to [phone]

## 2014-10-03 NOTE — Progress Notes (Signed)
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 She just had an EEG performed which is highly concerning for frontal status epilepticus which could explain her perseveration. Given the dramatic improvement without treatment of status epilepticus, I still think that hyperammonemia is the principal driver of this process. Given that she is awake, alert, and interactive I would not favor aggressive treatment such as intubation for suppression. At this time, I will start Keppra and repeat EEG tomorrow. I do not think that she needs prolonged antiepileptics, as I would consider this a provoked seizure.  Recommendations: 1) Ativan 2 mg 1 followed by Keppra load. 2) Keppra 500 mg twice a day. 3) repeat EEG tomorrow.   Roland Rack, MD [facility] [phone]  If 7pm- 7am, please page neurology on call as listed in [place].

## 2014-10-03 NOTE — Progress Notes (Signed)
Utilization review completed. Aiman Sonn, RN, BSN. 

## 2014-10-03 NOTE — Procedures (Addendum)
History: 74 yo F with AMS, hyperammoneia  Sedation: none  Technique: This is a 19 channel 40 minute scalp EEG performed at the bedside with bipolar and monopolar montages arranged in accordance to the international 10/20 system of electrode placement. One channel was dedicated to EKG recording.    Background: Throughout the study there is frontally predominant left hemispheric discharges with evolution and frequency of 2-3 Hz. They have sharp wave morphology. There is variable involvement of the right hemisphere, with discharges visible most of the time, but it is never predominant on the right side. Following Ativan administration, there is marked improvement though the discharges never dissipate completely  Following administration of Ativan, there is some improvement though the discharges never completely dissipate.  Photic stimulation: Physiologic driving is now performed  EEG Abnormalities: 1) frontal left predominant(but with wide field) sharp wave discharges with evolution 2) generalized irregular slow activity  Clinical Interpretation: This EEG is most consistent with ongoing seizure activity with a left frontal focus. Though hyperammonemia can cause EEG changes including sharp wave discharges, the frequency and character of these discharges as well as the evolution argue strongly for an ictal nature. 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  Roland Rack, MD [facility] [phone]  If 7pm- 7am, please page neurology on call as listed in [place].

## 2014-10-03 NOTE — Progress Notes (Signed)
SLP Cancellation Note  Patient Details Name: [name] MRN: [id number] DOB: Feb 16, 1940   Cancelled treatment:        Pt received Ativan this morning and unable to remain awake despite max tactile and verbal cues. Will continue efforts next date.   Houston Siren 10/03/2014, 2:47 PM  Orbie Pyo Colvin Caroli.Ed [employer] [phone]

## 2014-10-03 NOTE — Progress Notes (Signed)
EEG completed; results pending.  LEstil Daft, R.EEGT.

## 2014-10-03 NOTE — Progress Notes (Addendum)
Initial Nutrition Assessment  DOCUMENTATION CODES:   Not applicable  INTERVENTION:   -RD will follow for diet advancement and supplement diet as appropriate  NUTRITION DIAGNOSIS:   Inadequate oral intake related to inability to eat as evidenced by NPO status.  GOAL:   Patient will meet greater than or equal to 90% of their needs  MONITOR:   Diet advancement, Labs, Weight trends, Skin, I & O's  REASON FOR ASSESSMENT:   Malnutrition Screening Tool, Low Braden, Consult Diet education  ASSESSMENT:   CAMY HASLIP is a 74 y.o. 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  Pt admitted with AMS, acute encephalopathy. Neurology following.   Pt unresponsive at time of visit. No family present at time of visit. Phlebotomist at bedside reports she is unable to get pt to respond to any questions. Pt is currently NPO; awaiting BSE by SLP. Diet education is not appropriate at this time.   Wt hx reviewed. Noted UBW of 155#. Pt has experienced a 13.9% wt loss over the past 3 months.  Nutrition-Focused physical exam completed. Findings are no fat depletion, mild muscle depletion, and no edema.   Labs reviewed.   Diet Order:  Diet NPO time specified  Skin:  Reviewed, no issues  Last BM:  10/03/14  Height:   Ht Readings from Last 1 Encounters:  10/03/14 5\' 1"  (1.549 m)    Weight:   Wt Readings from Last 1 Encounters:  10/03/14 136 lb 11.2 oz (62.007 kg)    Ideal Body Weight:  47.7 kg  BMI:  Body mass index is 25.84 kg/(m^2).  Estimated Nutritional Needs:   Kcal:  1600-1800  Protein:  80-90 grams  Fluid:  1.6-1.8 L  EDUCATION NEEDS:   Education needs no appropriate at this time  Maykel Reitter A. Jimmye Norman, RD, LDN, CDE Pager: [phone] After hours  Pager: [phone]

## 2014-10-03 NOTE — Progress Notes (Signed)
NURSING PROGRESS NOTE  [name] [id number] Transfer Data: 10/03/2014 1:37 PM Attending Provider: Robbie Lis, MD [id number], MD Code Status: Full  [name] is a 74 y.o. female patient transferred from [facility]. Patient lethargic. RN on Metter stated that patient was alert for her this morning prior to ativan administration and has been sleeping since.   Cardiac Monitoring: Box # 07 in place. Cardiac monitor yields:sinus tachycardia.  Blood pressure 121/41, pulse 59, temperature 96.5 F (35.8 C), temperature source Axillary, resp. rate 16, height 5\' 1"  (1.549 m), weight 62.007 kg (136 lb 11.2 oz), SpO2 98 %.   IV Fluids:  IV in place, occlusive dsg intact without redness, IV cath antecubital left, condition patent and no redness normal saline.   Allergies:  Aspirin  Past Medical History:   has a past medical history of Anemia; S/P endoscopy; History of epistaxis; Hyperlipidemia; Telangiectasia; Lichen planus; GERD (gastroesophageal reflux disease); Heart murmur; CHF (congestive heart failure); Type II diabetes mellitus; History of blood transfusion ("several"); Chronic diastolic CHF (congestive heart failure) (10/03/2013); HTN (hypertension), benign (03/02/2012); Family history of anesthesia complication; Pneumonia (1990's X 2); and Seizures.  Past Surgical History:   has past surgical history that includes Nose surgery; Savory dilation (02/26/2011); Esophagogastroduodenoscopy (02/26/2011); Esophagogastroduodenoscopy (N/A, 11/08/2012); Cataract extraction; Esophagogastroduodenoscopy (N/A, 10/04/2013); Esophagogastroduodenoscopy (N/A, 07/06/2014); Hot hemostasis (N/A, 07/06/2014); and Esophagogastroduodenoscopy (N/A, 09/05/2014).  Social History:   reports that she quit smoking about 43 years ago. Her smoking use included Cigarettes. She quit after 2 years of use. She has never used smokeless tobacco. She reports that she does not drink alcohol or use illicit drugs.  Skin:  intact  Patient/Family orientated to room. Information packet given to patient/family. Admission inpatient armband information verified with patient/family to include name and date of birth and placed on patient arm. Side rails up x 2, fall assessment and education completed with patient/family. Patient/family able to verbalize understanding of risk associated with falls and verbalized understanding to call for assistance before getting out of bed. Call light within reach. Patient/family able to voice and demonstrate understanding of unit orientation instructions.    Will continue to evaluate and treat per MD orders.

## 2014-10-03 NOTE — H&P (Signed)
[facility] History and Physical  [name] [id number] DOB: January 12, 1941 DOA: 10/02/2014  Referring physician: ED physician PCP: Philis Fendt, MD  Specialists:   Chief Complaint: AMS  HPI: [name] is a 74 y.o. 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  Patient has AMS and is unable to provide history. Therefore, most of the history is obtained by discussing the case with the ED physician and from her husband and niece. Per family, patient was recently hospitalized from 7/26 to /29 because of GI bleeding. After she was discharged, she has been doing fine until yesterday when patient had became lethargic. It has gotten progressively worse today and becomes unresponsive now. Today they noted she had an episode of emesis. Husband notes she appeared to have twitching movements of her extremities when she was trying to move. Patient is constipated recently per her husband.  In ED, patient was found to have elevated ammonia 327, LFT normal, negative CT head for acute abnormalities, WBC 6.1, bradycardia, temperature normal, worsening renal function, alcohol level less than 5, acetaminophen level less than 10. Chest x-ray showed mild CHF change. Patient is admitted to inpatient for further evaluation and treatment. Neurology was consulted.   Where does patient live?   At home  Can patient participate in ADLs?  Yes     Review of Systems: could not be abtained due to AMS.  Allergy:  Allergies  Allergen Reactions  . Aspirin Nausea And Vomiting    Past Medical History  Diagnosis Date  . Anemia   . S/P endoscopy     with laser treatment  08/19/2006  . History of epistaxis   . Hyperlipidemia   . Telangiectasia     Gastric   . Lichen planus     Both lower  extremities  . GERD (gastroesophageal reflux disease)   . Heart murmur   . CHF (congestive heart failure)   . Pneumonia 1990's    "twice" (09/15/2012)  . Type II diabetes mellitus     insulin requiring.  Marland Kitchen History of blood transfusion     "getting 2 today; have had transfusions before" (09/15/2012)  . Family history of anesthesia complication     "niece has a hard time coming out" (09/15/2012)  . Chronic diastolic CHF (congestive heart failure) 10/03/2013  . HTN (hypertension), benign 03/02/2012    Past Surgical History  Procedure Laterality Date  . Nose surgery      "for bleeding" (09/15/2012)  . Savory dilation  02/26/2011    Procedure: SAVORY DILATION;  Surgeon: [name], MD;  Location: [name];  Service: Endoscopy;  Laterality: N/A;  c-arm needed  . Esophagogastroduodenoscopy  02/26/2011    Procedure: ESOPHAGOGASTRODUODENOSCOPY (EGD);  Surgeon: [name], MD;  Location: [name];  Service: Endoscopy;  Laterality: N/A;  . Esophagogastroduodenoscopy N/A 11/08/2012    Procedure: ESOPHAGOGASTRODUODENOSCOPY (EGD);  Surgeon: [name], MD;  Location: [name];  Service: Endoscopy;  Laterality: N/A;  . Cataract surgery    . Esophagogastroduodenoscopy N/A 10/04/2013    Procedure: ESOPHAGOGASTRODUODENOSCOPY (EGD);  Surgeon: Winfield Cunas., MD;  Location: [name];  Service: Endoscopy;  Laterality: N/A;  with APC on stand-by  . Esophagogastroduodenoscopy N/A 07/06/2014    Procedure: ESOPHAGOGASTRODUODENOSCOPY (EGD);  Surgeon: Clarene Essex, MD;  Location: [name];  Service: Endoscopy;  Laterality: N/A;  .  Hot hemostasis N/A 07/06/2014    Procedure: HOT HEMOSTASIS (ARGON PLASMA COAGULATION/BICAP);  Surgeon: Clarene Essex, MD;  Location: [name];  Service: Endoscopy;  Laterality: N/A;  . Esophagogastroduodenoscopy N/A 09/05/2014    Procedure: ESOPHAGOGASTRODUODENOSCOPY (EGD);  Surgeon: Laurence Spates, MD;  Location: [name];  Service: Endoscopy;  Laterality: N/A;   APC on standby to control bleeding    Social History:  reports that she quit smoking about 43 years ago. Her smoking use included Cigarettes. She quit after 2 years of use. She has never used smokeless tobacco. She reports that she does not drink alcohol or use illicit drugs.  Family History:  Family History  Problem Relation Age of Onset  . Breast cancer    . Malignant hyperthermia Neg Hx   . Stroke Father      Prior to Admission medications   Medication Sig Start Date End Date Taking? Authorizing Provider  AMITIZA 24 MCG capsule Take 24 mcg by mouth 2 (two) times daily. 08/29/14   Historical Provider, MD  cyclobenzaprine (FLEXERIL) 5 MG tablet Take 1 tablet (5 mg total) by mouth 3 (three) times daily as needed for muscle spasms. 08/28/14   Robbie Lis, MD  ferrous fumarate (HEMOCYTE - 106 MG FE) 325 (106 FE) MG TABS tablet Take 1 tablet by mouth daily.    Historical Provider, MD  furosemide (LASIX) 40 MG tablet Take 40mg  twice daily for two days and then start taking 40mg  once daily. Patient taking differently: Take 40 mg by mouth daily.  07/30/14   [name], MD  insulin detemir (LEVEMIR) 100 UNIT/ML injection Inject 0.3 mLs (30 Units total) into the skin at bedtime. Patient taking differently: Inject 30-40 Units into the skin 2 (two) times daily. 40 units in the Morning, and 30 units at Bedtime 10/05/13   Robbie Lis, MD  metoprolol succinate (TOPROL XL) 25 MG 24 hr tablet Take 0.5 tablets (12.5 mg total) by mouth daily. 09/07/14   Debbe Odea, MD  oxymetazoline (AFRIN) 0.05 % nasal spray Place 1 spray into both nostrils 2 (two) times daily as needed (Epistaxis). 10/05/13   Robbie Lis, MD  pantoprazole (PROTONIX) 40 MG tablet Take 1 tablet (40 mg total) by mouth 2 (two) times daily. 07/07/14   Hosie Poisson, MD  vitamin B-12 (CYANOCOBALAMIN) 1000 MCG tablet Take 1,000 mcg by mouth at bedtime.     Historical Provider, MD  Vitamin D, Ergocalciferol, (DRISDOL) 50000 UNITS CAPS Take  50,000 Units by mouth every 7 (seven) days. Takes on Monday    Historical Provider, MD    Physical Exam: Filed Vitals:   10/03/14 0145 10/03/14 0200 10/03/14 0242 10/03/14 0400  BP: 134/49 142/51 154/46 146/47  Pulse: 59 56 73 56  Temp:   97 F (36.1 C)   TempSrc:   Axillary   Resp: 13 13 13 12   Height:   5\' 1"  (1.549 m)   Weight:   62.007 kg (136 lb 11.2 oz)   SpO2: 100% 100% 100% 99%   General: Not in acute distress HEENT:       Eyes: PERRL, no scleral icterus.       ENT: No discharge from the ears and nose, no pharynx injection, no tonsillar enlargement.        Neck: No JVD, no bruit, no mass felt. Heme: No neck lymph node enlargement. Cardiac: S1/S2, RRR, No murmurs, No gallops or rubs. Pulm: No rales, wheezing, rhonchi or rubs. Abd: Soft, nondistended, nontender, no rebound pain,  no organomegaly, BS present. Ext: No pitting leg edema bilaterally. 2+DP/PT pulse bilaterally. Musculoskeletal: No joint deformities, No joint redness or warmth, no limitation of ROM in spin. Skin: No rashes.  Neuro: AMS and not oriented X3, cranial nerves II-XII grossly intact, moves extremities upon painful stimuli. Psych: Patient is not psychotic, no suicidal or hemocidal ideation.  Labs on Admission:  Basic Metabolic Panel:  Recent Labs Lab 10/02/14 2348 10/02/14 2358 10/03/14 0321  NA 139 141 138  K 4.2 4.0 3.8  CL 103 103 102  CO2 23  --  26  GLUCOSE 209* 214* 216*  BUN 32* 35* 31*  CREATININE 1.38* 1.30* 1.26*  CALCIUM 9.7  --  9.5   Liver Function Tests:  Recent Labs Lab 10/03/14 0145 10/03/14 0321  AST 26 25  ALT 23 23  ALKPHOS 115 116  BILITOT 0.6 0.7  PROT 6.5 6.7  ALBUMIN 3.5 3.6   No results for input(s): LIPASE, AMYLASE in the last 168 hours.  Recent Labs Lab 10/02/14 2348  AMMONIA 327*   CBC:  Recent Labs Lab 10/02/14 2348 10/02/14 2358 10/03/14 0145 10/03/14 0321  WBC 6.1  --  5.9 5.8  NEUTROABS 4.8  --   --   --   HGB 12.2 13.3 11.6* 11.8*   HCT 37.4 39.0 35.6* 35.1*  MCV 87.6  --  86.4 85.6  PLT 214  --  244 232   Cardiac Enzymes: No results for input(s): CKTOTAL, CKMB, CKMBINDEX, TROPONINI in the last 168 hours.  BNP (last 3 results)  Recent Labs  10/03/14 0321  BNP 100.0    ProBNP (last 3 results)  Recent Labs  10/03/13 1400 07/27/14 1548  PROBNP 155.7* 866.0*    CBG: No results for input(s): GLUCAP in the last 168 hours.  Radiological Exams on Admission: Ct Head Wo Contrast  10/03/2014   CLINICAL DATA:  Altered mental status.  Lethargy.  EXAM: CT HEAD WITHOUT CONTRAST  TECHNIQUE: Contiguous axial images were obtained from the base of the skull through the vertex without intravenous contrast.  COMPARISON:  Head CT 08/27/2014, brain MRI 08/28/2014  FINDINGS: No intracranial hemorrhage, mass effect, or midline shift. No hydrocephalus. The basilar cisterns are patent. No evidence of territorial infarct. No intracranial fluid collection. Generalized atrophy and chronic small vessel ischemic change, stable from prior exam. Calvarium is intact. Included paranasal sinuses and mastoid air cells are well aerated. Previous opacification of the right maxillary sinus is no longer seen.  IMPRESSION: Atrophy and chronic small vessel ischemic change. No acute intracranial abnormality.   Electronically Signed   By: Jeb Levering M.D.   On: 10/03/2014 00:25   US Renal  10/03/2014   CLINICAL DATA:  Acute kidney injury.  EXAM: RENAL / URINARY TRACT ULTRASOUND COMPLETE  COMPARISON:  None.  FINDINGS: Right Kidney:  Length: 8.4 cm. Echogenicity within normal limits. No mass or hydronephrosis visualized.  Left Kidney:  Length: 9.0 cm. Echogenicity within normal limits. No mass or hydronephrosis visualized.  Bladder:  Decompressed and not evaluated.  IMPRESSION: Normal sonographic appearance of the kidneys. No obstructive uropathy. Urinary bladder decompressed and not evaluated.   Electronically Signed   By: Jeb Levering M.D.   On:  10/03/2014 02:23   Dg Chest Portable 1 View  10/02/2014   CLINICAL DATA:  Altered mental status. Tired. Difficult to wake. History of diabetes and hypertension.  EXAM: PORTABLE CHEST - 1 VIEW  COMPARISON:  08/27/2014  FINDINGS: Shallow inspiration. Power port type central venous catheter  with tip over the low SVC. No pneumothorax. Cardiac enlargement with mild pulmonary vascular congestion. No edema or consolidation. No blunting of costophrenic angles. No pneumothorax. Calcified and tortuous aorta.  IMPRESSION: Cardiac enlargement with mild pulmonary vascular congestion. No edema or consolidation.   Electronically Signed   By: Lucienne Capers M.D.   On: 10/02/2014 23:20    EKG: Not done in ED, will get one.   Assessment/Plan Principal Problem:   Acute encephalopathy Active Problems:   Iron deficiency anemia   Gastric AVM   GI bleed   Osler-Weber-Rendu disease   DM (diabetes mellitus), type 2, uncontrolled, with renal complications   Chronic diastolic CHF (congestive heart failure)   Acute renal failure superimposed on stage 3 chronic kidney disease   Hyperammonemia   HTN (hypertension), benign  Acute encephalopathy: Etiology is not clear. CT head is negative for acute abnormalities. Ammonia of 327 with normal hepatic function. Possible explanation is that she could have absorbed ammonia from recent GI bleeding, which caused AMS. Neurology was consulted, Dr. Janann Colonel saw patient, agreed to start lactulose.  -will admit to SDU -Appreciate Dr. Janann Colonel consultation, will follow up recommendations -start lactulose per rectal -check EEG -Frequent neuro check  Iron deficiency anemia: Bilirubin stable, hemoglobin is 9.1 on 09/24/14-->12.2. 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 Patient is taking Levemir at home -will decrease Levemir dose dose  from 30-40 to 20 units twice a day -SSI  AoCKD-III: Baseline Cre is 1.0, her Cre is 1.30, BUN 35 on admission. Likely due to prerenal secondary to dehydration and continuation of ACEI and diuretics. - IVF: NS 50 cc/h - Check  FeUrea - US-renal - Follow up renal function by BMP - Hold enalapril and Lasix   HNT: -hold oral enalapril as above -IV hydralazine when necessary -Metoprolol IV when necessary  Chronic diastolic CHF (congestive heart failure): 2-D echo on 07/06/14 showed EF 65-70% with grade 2 diastolic dysfunction. Patient does not have leg edema. CHF seems to be compensated. -Hold Lasix due to worsening renal function -Check BNP   DVT ppx: SQ Heparin   Code Status: Full code Family Communication: Yes, patient's husband and niece at bed side Disposition Plan: Admit to inpatient   Date of Service 10/03/2014    Ivor Costa [facility] Pager [phone]  If 7PM-7AM, please contact night-coverage [email or web address] Password [facility] 10/03/2014, 5:48 AM

## 2014-10-03 NOTE — Consult Note (Addendum)
Consult Reason for Consult:altered mental status Referring Physician: Dr Blaine Hamper [facility]  CC:   HPI: [name] is an 74 y.o. female hx of DM, CHF, recent GI bleed presenting today with altered mental status. Family notes she seemed lethargic yesterday and it has gotten progressively worse today so they brought here to the ED. Today they noted she had an episode of emesis. Husband notes she appeared to have twitching movements of her extremities when she was trying to move.   CT head imaging reviewed and overall unremarkable. Patient afebrile. No leukocytosis. Ammonia 327. BUN 32, Cr 1.38  Past Medical History  Diagnosis Date  . Anemia   . S/P endoscopy     with laser treatment  08/19/2006  . History of epistaxis   . Hyperlipidemia   . Telangiectasia     Gastric   . Lichen planus     Both lower extremities  . GERD (gastroesophageal reflux disease)   . Heart murmur   . CHF (congestive heart failure)   . Pneumonia 1990's    "twice" (09/15/2012)  . Type II diabetes mellitus     insulin requiring.  Marland Kitchen History of blood transfusion     "getting 2 today; have had transfusions before" (09/15/2012)  . Family history of anesthesia complication     "niece has a hard time coming out" (09/15/2012)  . Chronic diastolic CHF (congestive heart failure) 10/03/2013  . HTN (hypertension), benign 03/02/2012    Past Surgical History  Procedure Laterality Date  . Nose surgery      "for bleeding" (09/15/2012)  . Savory dilation  02/26/2011    Procedure: SAVORY DILATION;  Surgeon: [name], MD;  Location: [name];  Service: Endoscopy;  Laterality: N/A;  c-arm needed  . Esophagogastroduodenoscopy  02/26/2011    Procedure: ESOPHAGOGASTRODUODENOSCOPY (EGD);  Surgeon: [name], MD;  Location: [name];  Service: Endoscopy;  Laterality: N/A;  . Esophagogastroduodenoscopy N/A 11/08/2012    Procedure: ESOPHAGOGASTRODUODENOSCOPY (EGD);  Surgeon: [name], MD;  Location: [name];  Service:  Endoscopy;  Laterality: N/A;  . Cataract surgery    . Esophagogastroduodenoscopy N/A 10/04/2013    Procedure: ESOPHAGOGASTRODUODENOSCOPY (EGD);  Surgeon: Winfield Cunas., MD;  Location: [name];  Service: Endoscopy;  Laterality: N/A;  with APC on stand-by  . Esophagogastroduodenoscopy N/A 07/06/2014    Procedure: ESOPHAGOGASTRODUODENOSCOPY (EGD);  Surgeon: Clarene Essex, MD;  Location: [name];  Service: Endoscopy;  Laterality: N/A;  . Hot hemostasis N/A 07/06/2014    Procedure: HOT HEMOSTASIS (ARGON PLASMA COAGULATION/BICAP);  Surgeon: Clarene Essex, MD;  Location: [name];  Service: Endoscopy;  Laterality: N/A;  . Esophagogastroduodenoscopy N/A 09/05/2014    Procedure: ESOPHAGOGASTRODUODENOSCOPY (EGD);  Surgeon: Laurence Spates, MD;  Location: [name];  Service: Endoscopy;  Laterality: N/A;  APC on standby to control bleeding    Family History  Problem Relation Age of Onset  . Breast cancer    . Malignant hyperthermia Neg Hx   . Stroke Father     Social History:  reports that she quit smoking about 43 years ago. Her smoking use included Cigarettes. She quit after 2 years of use. She has never used smokeless tobacco. She reports that she does not drink alcohol or use illicit drugs.  Allergies  Allergen Reactions  . Aspirin Nausea And Vomiting    ROS: Out of a complete 14 system review, the patient complains of only the following symptoms, and all other reviewed systems are negative. +altered mental status  Physical  Examination: Filed Vitals:   10/03/14 0200  BP: 142/51  Pulse: 56  Temp:   Resp: 13   Physical Exam  Constitutional: [name].  Psych: Affect appropriate to situation Eyes: No scleral injection HENT: No OP obstrucion Head: Normocephalic.  Cardiovascular: Normal rate and regular rhythm.  Respiratory: Effort normal and breath sounds normal.  GI: Soft. Bowel sounds are normal. No distension. There is no  tenderness.  Skin: WDI  Neurologic Examination Mental Status: Eyes closed, nonverbal, not following commands Cranial Nerves: II: optic discs not visualized, no blink to threat, pupils equal, round, reactive to light III,IV, VI: ptosis not present, eyes midline, no gaze deviation V,VII: face symmetric, corneal + IX,unable to test XII: unable to test Motor: No spontaneous movement, no abnormal movements, no asterixis noted Tone and bulk:normal tone throughout; no atrophy noted Sensory: withdrawals and grimaces to noxious stimuli Deep Tendon Reflexes: 2+ and symmetric throughout Plantars: Right: downgoing   Left: downgoing Cerebellar: Unable to test Gait: unable to test  Laboratory Studies:   Basic Metabolic Panel:  Recent Labs Lab 10/02/14 2348 10/02/14 2358  NA 139 141  K 4.2 4.0  CL 103 103  CO2 23  --   GLUCOSE 209* 214*  BUN 32* 35*  CREATININE 1.38* 1.30*  CALCIUM 9.7  --     Liver Function Tests: No results for input(s): AST, ALT, ALKPHOS, BILITOT, PROT, ALBUMIN in the last 168 hours. No results for input(s): LIPASE, AMYLASE in the last 168 hours.  Recent Labs Lab 10/02/14 2348  AMMONIA 327*    CBC:  Recent Labs Lab 10/02/14 2348 10/02/14 2358 10/03/14 0145  WBC 6.1  --  5.9  NEUTROABS 4.8  --   --   HGB 12.2 13.3 11.6*  HCT 37.4 39.0 35.6*  MCV 87.6  --  86.4  PLT 214  --  244    Cardiac Enzymes: No results for input(s): CKTOTAL, CKMB, CKMBINDEX, TROPONINI in the last 168 hours.  BNP: Invalid input(s): POCBNP  CBG: No results for input(s): GLUCAP in the last 168 hours.  Microbiology: Results for orders placed or performed during the hospital encounter of 08/27/14  Culture, blood (routine x 2)     Status: None   Collection Time: 08/27/14  2:15 PM  Result Value Ref Range Status   Specimen Description BLOOD PORTA CATH  Final   Special Requests BOTTLES DRAWN AEROBIC AND ANAEROBIC 5ML  Final   Culture   Final    NO GROWTH 5  DAYS Performed at [facility]    Report Status 09/01/2014 FINAL  Final  Urine culture     Status: None   Collection Time: 08/27/14  2:58 PM  Result Value Ref Range Status   Specimen Description URINE, RANDOM  Final   Special Requests NONE  Final   Culture   Final    MULTIPLE SPECIES PRESENT, SUGGEST RECOLLECTION IF CLINICALLY INDICATED Performed at [facility]    Report Status 08/29/2014 FINAL  Final  Culture, blood (routine x 2)     Status: None   Collection Time: 08/27/14  2:59 PM  Result Value Ref Range Status   Specimen Description BLOOD LEFT HAND  Final   Special Requests BOTTLES DRAWN AEROBIC AND ANAEROBIC 5ML  Final   Culture   Final    NO GROWTH 5 DAYS Performed at [facility]    Report Status 09/01/2014 FINAL  Final    Coagulation Studies:  Recent Labs  10/03/14 0145  LABPROT 14.9  INR 1.16    Urinalysis:  Recent Labs Lab 10/02/14 2307  COLORURINE YELLOW  LABSPEC 1.019  PHURINE 5.5  GLUCOSEU NEGATIVE  HGBUR NEGATIVE  BILIRUBINUR NEGATIVE  KETONESUR NEGATIVE  PROTEINUR NEGATIVE  UROBILINOGEN 0.2  NITRITE NEGATIVE  LEUKOCYTESUR NEGATIVE    Lipid Panel:     Component Value Date/Time   CHOL  01/19/2009 0630    191        ATP III CLASSIFICATION:  <200     mg/dL   Desirable  200-239  mg/dL   Borderline High  >=240    mg/dL   High          TRIG 184* 01/19/2009 0630   HDL 45 01/19/2009 0630   CHOLHDL 4.2 01/19/2009 0630   VLDL 37 01/19/2009 0630   LDLCALC * 01/19/2009 0630    109        Total Cholesterol/HDL:CHD Risk Coronary Heart Disease Risk Table                     Men   Women  1/2 Average Risk   3.4   3.3  Average Risk       5.0   4.4  2 X Average Risk   9.6   7.1  3 X Average Risk  23.4   11.0        Use the calculated Patient Ratio above and the CHD Risk Table to determine the patient's CHD Risk.        ATP III CLASSIFICATION (LDL):  <100     mg/dL   Optimal  100-129  mg/dL   Near or Above                     Optimal  130-159  mg/dL   Borderline  160-189  mg/dL   High  >190     mg/dL   Very High    HgbA1C:  Lab Results  Component Value Date   HGBA1C 7.3* 09/05/2014    Urine Drug Screen:     Component Value Date/Time   LABOPIA NONE DETECTED 10/02/2014 2307   COCAINSCRNUR NONE DETECTED 10/02/2014 2307   LABBENZ NONE DETECTED 10/02/2014 2307   AMPHETMU NONE DETECTED 10/02/2014 2307   THCU NONE DETECTED 10/02/2014 2307   LABBARB NONE DETECTED 10/02/2014 2307    Alcohol Level:  Recent Labs Lab 10/02/14 2348  ETH <5    Other results:  Imaging: Ct Head Wo Contrast  10/03/2014   CLINICAL DATA:  Altered mental status.  Lethargy.  EXAM: CT HEAD WITHOUT CONTRAST  TECHNIQUE: Contiguous axial images were obtained from the base of the skull through the vertex without intravenous contrast.  COMPARISON:  Head CT 08/27/2014, brain MRI 08/28/2014  FINDINGS: No intracranial hemorrhage, mass effect, or midline shift. No hydrocephalus. The basilar cisterns are patent. No evidence of territorial infarct. No intracranial fluid collection. Generalized atrophy and chronic small vessel ischemic change, stable from prior exam. Calvarium is intact. Included paranasal sinuses and mastoid air cells are well aerated. Previous opacification of the right maxillary sinus is no longer seen.  IMPRESSION: Atrophy and chronic small vessel ischemic change. No acute intracranial abnormality.   Electronically Signed   By: Jeb Levering M.D.   On: 10/03/2014 00:25   Dg Chest Portable 1 View  10/02/2014   CLINICAL DATA:  Altered mental status. Tired. Difficult to wake. History of diabetes and hypertension.  EXAM: PORTABLE CHEST - 1 VIEW  COMPARISON:  08/27/2014  FINDINGS: Shallow inspiration. Power port type central venous catheter with tip over the low SVC. No pneumothorax. Cardiac enlargement with mild pulmonary vascular congestion. No edema or consolidation. No blunting of costophrenic angles. No pneumothorax.  Calcified and tortuous aorta.  IMPRESSION: Cardiac enlargement with mild pulmonary vascular congestion. No edema or consolidation.   Electronically Signed   By: Lucienne Capers M.D.   On: 10/02/2014 23:20     Assessment/Plan:  74y/o woman hx of DM, CHF admitted with altered mental status. CT head imaging unremarkable. Labs pertinent for ammonia of 327 with normal hepatic function. Unclear etiology. Family reports history of GI bleed 1 months ago, Hb currently stable.   -agree with lactulose for elevated ammonia -check EEG -will follow  Jim Like, DO [facility] [phone]  If 7pm- 7am, please page neurology on call as listed in [place]. 10/03/2014, 2:20 AM

## 2014-10-04 ENCOUNTER — Inpatient Hospital Stay (HOSPITAL_COMMUNITY): Payer: Medicare Other

## 2014-10-04 DIAGNOSIS — G40301 Generalized idiopathic epilepsy and epileptic syndromes, not intractable, with status epilepticus: Secondary | ICD-10-CM

## 2014-10-04 LAB — CBC
HCT: 32.2 % — ABNORMAL LOW (ref 36.0–46.0)
HEMOGLOBIN: 10.4 g/dL — AB (ref 12.0–15.0)
MCH: 28.3 pg (ref 26.0–34.0)
MCHC: 32.3 g/dL (ref 30.0–36.0)
MCV: 87.7 fL (ref 78.0–100.0)
PLATELETS: 194 10*3/uL (ref 150–400)
RBC: 3.67 MIL/uL — AB (ref 3.87–5.11)
RDW: 19.7 % — ABNORMAL HIGH (ref 11.5–15.5)
WBC: 4.5 10*3/uL (ref 4.0–10.5)

## 2014-10-04 LAB — UREA NITROGEN, URINE: Urea Nitrogen, Ur: 877 mg/dL

## 2014-10-04 LAB — GLUCOSE, CAPILLARY
GLUCOSE-CAPILLARY: 111 mg/dL — AB (ref 65–99)
GLUCOSE-CAPILLARY: 129 mg/dL — AB (ref 65–99)
GLUCOSE-CAPILLARY: 208 mg/dL — AB (ref 65–99)
GLUCOSE-CAPILLARY: 229 mg/dL — AB (ref 65–99)
Glucose-Capillary: 197 mg/dL — ABNORMAL HIGH (ref 65–99)
Glucose-Capillary: 252 mg/dL — ABNORMAL HIGH (ref 65–99)

## 2014-10-04 LAB — AMMONIA: AMMONIA: 80 umol/L — AB (ref 9–35)

## 2014-10-04 MED ORDER — METOPROLOL TARTRATE 1 MG/ML IV SOLN
5.0000 mg | Freq: Three times a day (TID) | INTRAVENOUS | Status: DC
  Administered 2014-10-04 – 2014-10-05 (×2): 5 mg via INTRAVENOUS
  Filled 2014-10-04 (×3): qty 5

## 2014-10-04 MED ORDER — SODIUM CHLORIDE 0.9 % IJ SOLN
10.0000 mL | INTRAMUSCULAR | Status: DC | PRN
Start: 2014-10-04 — End: 2014-10-05
  Administered 2014-10-05: 10 mL
  Filled 2014-10-04: qty 40

## 2014-10-04 NOTE — Procedures (Signed)
ELECTROENCEPHALOGRAM REPORT  Patient: [name]       Room #: [id number] EEG No. ID: [id number] Age: 74 y.o.        Sex: female Referring Physician: Charlies Silvers, A Report Date:  10/04/2014        Interpreting Physician: Anthony Sar  History: [name] is an 74 y.o. female history diabetes mellitus, hypertension, hyperlipidemia and CHF, admitted with altered mental status markedly elevated ammonia level (327). EEG showed generalized slowing as well as findings consistent with status epilepticus. She's been treated with Keppra in addition to lactulose. Mental status has improved today and ammonia level is down to 80.  Indications for study:  Assess severity of encephalopathy; rule out continued seizure activity.  Technique: This is an 18 channel routine scalp EEG performed at the bedside with bipolar and monopolar montages arranged in accordance to the international 10/20 system of electrode placement.   Description: This EEG recording was performed during wakefulness and during sleep. Predominant activity during wakefulness consisted of low to moderate amplitude diffuse symmetrical mixed delta and theta activity which was symmetrical. Photic stimulation was not performed. Symmetrical sleep spindles and vertex waves as well as occasional arousal responses were recorded during stage II of sleep. No epileptiform discharges were recorded during wakefulness nor during sleep.  Interpretation: This EEG is abnormal with moderate generalized nonspecific slowing of cerebral activity. This pattern of slowing can be seen with metabolic/toxic etiologies well his with degenerative encephalopathies. No evidence of epileptiform activity was seen in this recording.  Rush Farmer M.D. [facility] [phone]

## 2014-10-04 NOTE — Progress Notes (Addendum)
Patient ID: [name], female   DOB: September 02, 1940, 74 y.o.   MRN: [id number] [facility] PROGRESS NOTE  [name] [id number] DOB: 03-Dec-1940 DOA: 10/02/2014 PCP: Philis Fendt, MD  Brief narrative:    74 y.o. 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 Patient was recently hospitalized for generalized weakness thought to be secondary to acute blood loss anemia from iron deficiency anemia (discharge 09/07/2014, 3 day hospital stay).   Patient presented to [facility] 10/02/14 with altered mental status. Apparently per family report there was a questionable seizure episode as well prior to the admission. In addition patient's husband reported that patient had episode of vomiting.  Patient was hemodynamically stable on the admission. Her labwork was significant for ammonia level of 327, creatinine 1.3 (baseline from recent admission is 1.29). CT head did not show acute intracranial abnormalities. Chest x-ray showed mild pulmonary vascular congestion but no edema or consolidation. Patient was admitted for further evaluation of altered mental status. Neurology has seen the patient in consultation. Workup included EEG which was significant for status epilepticus.  Anticipated discharge: Will follow-up with neurology recommendations, patient is more alert and oriented this morning. Anticipated discharge by 10/06/2014  Assessment/Plan:    Principal problem: Acute encephalopathy / seizure disorder - EEG on this admission demonstrated status epilepticus in left frontal region. This is most likely cause of acute encephalopathy. Appreciate neurology following  - Unclear etiology of seizures but cause possibly be related to her history of  AVMs, vasculitic disorder. She did have MRI back in July 2016 with findings that could relate to hepatic insufficiency, possible vasculitic disorders. She does have chronic vascular changes noted on MRI at that time. - Patient has received loading dose of Keppra. Continue Keppra 500 mg IV every 12 hours - Patient did have elevated ammonia level of 327 but her liver functions were within normal limits. She was started on lactulose. Alcohol level was within normal limits. - CT head did not show acute intracranial findings - Will continue neuro checks per floor protocol.  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 - No current indications for transfusion.  DM (diabetes mellitus), type 2, uncontrolled, with renal complications - Continue Levemir 20 units at bedtime along with sliding scale insulin - CBG's in past 24 hours: 65, 129, 111 - HgA1c in 08/2014 7.3   Chronic kidney disease, stage III - Baseline creatinine 1.29 and on this admission 1.3, around patient's baseline range  Essential hypertension - Currently taking metoprolol 5 mg IV every 6 hours but due to slight bradycardia decrease frequency to every 8 hours  - Patient is on enalapril, Norvasc, Lasix and metoprolol at home for blood pressure control - We will make necessary adjustment to blood pressure medication regimen if needed at the time of discharge  Chronic diastolic CHF (congestive heart failure) - 2-D echo on 07/06/14 showed EF 65-70% with grade 2 diastolic dysfunction.  - Compensated - Mild pulmonary vascular congestion seen on chest x-ray but no edema. - Lasix placed on hold at the time of the admission due to renal insufficiency.   DVT Prophylaxis - Heparin subcutaneous ordered while pt  in hospital    Code Status: Full.  Family  Communication:  Updated the patient's daughter at the bedside  Disposition Plan: Needs physical therapy evaluation; anticipate discharge in next 1-2 days, likely by 10/06/2014.  IV access:  Peripheral IV  Procedures and diagnostic studies:    Ct Head Wo Contrast 10/03/2014   Atrophy and chronic small vessel ischemic change. No acute intracranial abnormality.   Electronically Signed   By: Jeb Levering M.D.   On: 10/03/2014 00:25   US Renal 10/03/2014   Normal sonographic appearance of the kidneys. No obstructive uropathy. Urinary bladder decompressed and not evaluated.   Electronically Signed   By: Jeb Levering M.D.   On: 10/03/2014 02:23   Dg Chest Portable 1 View 10/02/2014   Cardiac enlargement with mild pulmonary vascular congestion. No edema or consolidation.   Electronically Signed   By: Lucienne Capers M.D.   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   Medical Consultants:  Neurology  Other Consultants:  Physical therapy Nutrition  IAnti-Infectives:   None    Leisa Lenz, MD  [facility] Pager [phone]   If 7PM-7AM, please contact night-coverage [email or web address] Password [email or web address] 10/04/2014, 10:07 AM   LOS: 1 day    HPI/Subjective: No acute overnight events. Patient reports feeling better. She is more alert and oriented.  Objective: Filed Vitals:   10/03/14 1500 10/03/14 1757 10/03/14 2134 10/04/14 0535  BP: 115/41 137/44 131/39 130/48  Pulse: 55 59 26 51  Temp: 96.4 F (35.8 C)  98.3 F (36.8 C) 97.8 F (36.6 C)  TempSrc: Axillary  Oral Oral  Resp: 16  18 18   Height:      Weight: 62.143 kg (137 lb)     SpO2: 99%  98% 100%    Intake/Output Summary (Last 24 hours) at 10/04/14 1007 Last data filed at 10/04/14 0707  Gross per 24 hour  Intake 531.66 ml  Output    800 ml  Net -268.34 ml    Exam:   General:   Pt is more alert, no distress  Cardiovascular: Slightly bradycardic, appreciate S1-S2  Respiratory: Bilateral air entry, no wheezing  Abdomen: Nontender abdomen, bowel sounds appreciated  Extremities: No leg swelling, pulses palpable  Neuro: No focal deficits  Data Reviewed: Basic Metabolic Panel:  Recent Labs Lab 10/02/14 2348 10/02/14 2358 10/03/14 0321  NA 139 141 138  K 4.2 4.0 3.8  CL 103 103 102  CO2 23  --  26  GLUCOSE 209* 214* 216*  BUN 32* 35* 31*  CREATININE 1.38* 1.30* 1.26*  CALCIUM 9.7  --  9.5   Liver Function Tests:  Recent Labs Lab 10/03/14 0145 10/03/14 0321  AST 26 25  ALT 23 23  ALKPHOS 115 116  BILITOT 0.6 0.7  PROT 6.5 6.7  ALBUMIN 3.5 3.6   No results for input(s): LIPASE, AMYLASE in the last 168 hours.  Recent Labs Lab 10/02/14 2348  AMMONIA 327*   CBC:  Recent Labs Lab 10/02/14 2348  10/03/14 0321 10/03/14 0900 10/03/14 1331 10/03/14 1903 10/04/14 0127  WBC 6.1  < > 5.8 7.7 5.3 4.1 4.5  NEUTROABS 4.8  --   --   --   --   --   --   HGB 12.2  < > 11.8* 12.5 11.5* 11.4* 10.4*  HCT 37.4  < > 35.1* 38.0 35.5* 35.2* 32.2*  MCV 87.6  < > 85.6 86.6 87.2 86.5 87.7  PLT 214  < > 232 224 245 157 194  < > = values in this interval not displayed. Cardiac Enzymes: No results for input(s): CKTOTAL, CKMB, CKMBINDEX, TROPONINI in the last 168 hours. BNP: Invalid input(s): POCBNP CBG:  Recent Labs Lab 10/03/14 1149 10/03/14 1701 10/03/14 2256 10/04/14 0005 10/04/14 0745  GLUCAP 221* 114* 65 129* 111*    Recent Results (from the past 240 hour(s))  MRSA PCR Screening     Status: None   Collection Time: 10/03/14  2:39 AM  Result Value Ref Range Status   MRSA by PCR NEGATIVE NEGATIVE Final    Comment:        The GeneXpert MRSA Assay (FDA approved for NASAL specimens only), is one component of a comprehensive MRSA colonization surveillance program. It is not intended to diagnose MRSA infection nor to guide or monitor  treatment for MRSA infections.      Scheduled Meds: . heparin  5,000 Units Subcutaneous 3 times per day  . insulin aspart  0-9 Units Subcutaneous TID WC  . insulin detemir  20 Units Subcutaneous QHS  . lactulose  300 mL Rectal BID  . levETIRAcetam  500 mg Intravenous Q12H  . metoprolol  5 mg Intravenous 4 times per day  . pantoprazole (PROTONIX)   40 mg Intravenous Q24H  . sodium chloride  3 mL Intravenous Q12H   Continuous Infusions: . sodium chloride 50 mL/hr at 10/03/14 2232

## 2014-10-04 NOTE — Progress Notes (Signed)
Lost IV access, notified MD, awaiting response for orders to change meds to PO or order to access port.

## 2014-10-04 NOTE — Progress Notes (Signed)
Utilization Review completed. Edward Trevino RN BSN CM 

## 2014-10-04 NOTE — Evaluation (Signed)
Clinical/Bedside Swallow Evaluation Patient Details  Name: [name] MRN: [id number] Date of Birth: [birth year]  Today's Date: 10/04/2014 Time: SLP Start Time (ACUTE ONLY): [id number] SLP Stop Time (ACUTE ONLY): 0932 SLP Time Calculation (min) (ACUTE ONLY): 15 min  Past Medical History:  Past Medical History  Diagnosis Date  . Anemia   . S/P endoscopy     with laser treatment  08/19/2006  . History of epistaxis   . Hyperlipidemia   . Telangiectasia     Gastric   . Lichen planus     Both lower extremities  . GERD (gastroesophageal reflux disease)   . Heart murmur   . CHF (congestive heart failure)   . Type II diabetes mellitus     insulin requiring.  Marland Kitchen History of blood transfusion "several"  . Chronic diastolic CHF (congestive heart failure) 10/03/2013  . HTN (hypertension), benign 03/02/2012  . Family history of anesthesia complication     "niece has a hard time coming out" (09/15/2012)  . Pneumonia 1990's X 2  . Seizures     "they think she might be having seizures; they are checking her for that now" (10/02/2014)   Past Surgical History:  Past Surgical History  Procedure Laterality Date  . Nose surgery      "for bleeding"   . Savory dilation  02/26/2011    Procedure: SAVORY DILATION;  Surgeon: [name], MD;  Location: [name];  Service: Endoscopy;  Laterality: N/A;  c-arm needed  . Esophagogastroduodenoscopy  02/26/2011    Procedure: ESOPHAGOGASTRODUODENOSCOPY (EGD);  Surgeon: [name], MD;  Location: [name];  Service: Endoscopy;  Laterality: N/A;  . Esophagogastroduodenoscopy N/A 11/08/2012    Procedure: ESOPHAGOGASTRODUODENOSCOPY (EGD);  Surgeon: [name], MD;  Location: [name];  Service: Endoscopy;  Laterality: N/A;  . Cataract extraction      "I think it was just one eye"  . Esophagogastroduodenoscopy N/A 10/04/2013    Procedure: ESOPHAGOGASTRODUODENOSCOPY (EGD);  Surgeon: Winfield Cunas., MD;  Location: [name];  Service: Endoscopy;   Laterality: N/A;  with APC on stand-by  . Esophagogastroduodenoscopy N/A 07/06/2014    Procedure: ESOPHAGOGASTRODUODENOSCOPY (EGD);  Surgeon: Clarene Essex, MD;  Location: [name];  Service: Endoscopy;  Laterality: N/A;  . Hot hemostasis N/A 07/06/2014    Procedure: HOT HEMOSTASIS (ARGON PLASMA COAGULATION/BICAP);  Surgeon: Clarene Essex, MD;  Location: [name];  Service: Endoscopy;  Laterality: N/A;  . Esophagogastroduodenoscopy N/A 09/05/2014    Procedure: ESOPHAGOGASTRODUODENOSCOPY (EGD);  Surgeon: Laurence Spates, MD;  Location: [name];  Service: Endoscopy;  Laterality: N/A;  APC on standby to control bleeding   HPI:  74 y.o. 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 CXR Cardiac enlargement with mild pulmonary vascular congestion. No edema or consolidation.   Assessment / Plan / Recommendation Clinical Impression  Bedside swallow evaluation complete.  Patient with increased arousal and ability to participate today.  Oral motor exam revealed mild general weakness.  Patient consumed thin liquids with timely swallows and no overt s/s of aspiration. Patient with missing detention and reported that at baseline she had difficulty chewing breads; however, she demonstrated effective mastication of Dys.2 textures and suspect this to be her baseline.  Recommend initiation of a Dys.2 texture diet with thin liquids and follow up x1 to ensure toleration and that Dys.2 textures are her baseline.     Aspiration Risk  Mild    Diet Recommendation Dysphagia 2 (Fine chop);Thin   Medication Administration: Whole meds with liquid Compensations: Slow rate;Small sips/bites    Other  Recommendations Oral Care Recommendations: Oral care BID   Follow Up Recommendations  None   Frequency and Duration min 1 x/week  1 week    Pertinent Vitals/Pain None    SLP Swallow Goals  See care plan   Swallow Study Prior Functional Status   Patient reports difficulty chewing breads prior to admission due to missing dentition     General Other Pertinent Information: 74 y.o. 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 CXR Cardiac enlargement with mild pulmonary vascular congestion. No edema or consolidation. Type of Study: Bedside swallow evaluation Previous Swallow Assessment: none on record Diet Prior to this Study: NPO Temperature Spikes Noted: No Respiratory Status: Room air History of Recent Intubation: No Behavior/Cognition: Alert;Cooperative;Pleasant mood Oral Cavity - Dentition: Missing dentition;Poor condition Self-Feeding Abilities: Able to feed self;Needs set up Patient Positioning: Upright in bed Baseline Vocal Quality: Normal Volitional Cough: Strong Volitional Swallow: Able to elicit    Oral/Motor/Sensory Function Overall Oral Motor/Sensory Function: Appears within functional limits for tasks assessed (slight weakness)   Ice Chips Ice chips: Within functional limits   Thin Liquid Thin Liquid: Within functional limits Presentation: Cup;Straw    Nectar Thick Nectar Thick Liquid: Not tested   Honey Thick Honey Thick Liquid: Not tested   Puree Puree: Within functional limits Presentation: Self Fed;Spoon   Solid   GO    Solid: Impaired Other Comments: unable to masticate prior to admission; Dys.2 textures [facility]      Gunnar Fusi, M.A., CCC-SLP [phone]  [name] 10/04/2014,9:37 AM

## 2014-10-04 NOTE — Progress Notes (Signed)
Subjective: No complaints.  In bed alert and knows she is in the hospital, the month and year. follows three step commands.   Exam: Filed Vitals:   10/04/14 0535  BP: 130/48  Pulse: 51  Temp: 97.8 F (36.6 C)  Resp: 18    HEENT-  Normocephalic, no lesions, without obvious abnormality.  Normal external eye and conjunctiva.  Normal TM's bilaterally.  Normal auditory canals and external ears. Normal external nose, mucus membranes and septum.  Normal pharynx. Cardiovascular- S1, S2 normal, pulses palpable throughout   Lungs- no tachypnea, retractions or cyanosis Abdomen- normal findings: bowel sounds normal Extremities- no edema     Gen: In bed, NAD MS: awake alert, knows she is in the hospital and the month/year. Speech is clear. follows 2+3 step commands.  CN: PERRLA, EOMI, TML, Face symmetrical Motor: MAEW Sensory: intact   Pertinent Labs: none  Etta Quill PA-C [facility] [phone]  Impression: 74 year old female admitted with hyperammonemia and encephalopathy. EEG revealed ongoing sharp activity in the left hemisphere concerning for focal status epilepticus. She has markedly improved since admission and I suspect that the seizure that was recorded secondary to hyperammonemia, though this is rare has been reported.  Given this, I would consider this a provoked seizure and would not favor long-term antiepileptics unless she were to have other seizures in the future.  Recommendations: 1) repeat EEG   Roland Rack, MD [facility] [phone]  If 7pm- 7am, please page neurology on call as listed in [place].  10/04/2014, 9:10 AM

## 2014-10-04 NOTE — Progress Notes (Signed)
EEG completed; results pending.  LEstil Daft, R.EEGT.

## 2014-10-04 NOTE — Care Management Note (Addendum)
Case Management Note  Patient Details  Name: [name] MRN: [id number] Date of Birth: 04/09/1940  Subjective/Objective:   NCM spoke with patient , and spouse, they chose [facility] for HHPT.  Also they have applied for [facility] services thru [facility] and has an assessment scheduled for patient to receive services.  Patient has transportation at [employer] and she has a PCP and insurance coverage.  NCM will cont to follow for dc needs.    Referral made to [facility], [place] notified.              Action/Plan:   Expected Discharge Date:                  Expected Discharge Plan:  [place]  In-House Referral:     Discharge planning Services  CM Consult  Post Acute Care Choice:  Home Health Choice offered to:  Patient  DME Arranged:    DME Agency:     HH Arranged:  PT [place]:  [place]  Status of Service:  In process, will continue to follow  Medicare Important Message Given:    Date Medicare IM Given:    Medicare IM give by:    Date Additional Medicare IM Given:    Additional Medicare Important Message give by:     If discussed at [place] of Stay Meetings, dates discussed:    Additional Comments:  Zenon Mayo, RN 10/04/2014, 3:10 PM

## 2014-10-04 NOTE — Evaluation (Signed)
Physical Therapy Evaluation Patient Details Name: [name] MRN: [id number] DOB: 1940/09/14 Today's Date: 10/04/2014   History of Present Illness  74 year old female admitted with hyperammonemia and encephalopathy. EEG revealed seizure which neurology felt was provoked by hyperammonemia. 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 Pt needs skilled PT to maximize independence and safety to allow discharge to home with family providing 24 hour assist. Emphasized to family that needs assistance with mobility due to high fall risk due to decr balance and cognition.     Follow Up Recommendations Home health PT;Supervision/Assistance - 24 hour    Equipment Recommendations  Other (comment) (To be assessed)    Recommendations for Other Services       Precautions / Restrictions Precautions Precautions: Fall Restrictions Weight Bearing Restrictions: No      Mobility  Bed Mobility Overal bed mobility: Modified Independent                Transfers Overall transfer level: Needs assistance   Transfers: Sit to/from Stand;Stand Pivot Transfers Sit to Stand: Min assist Stand pivot transfers: Min assist       General transfer comment: Assist for balance  Ambulation/Gait Ambulation/Gait assistance: Min assist Ambulation Distance (Feet): 325 Feet Assistive device: 1 person hand held assist Gait Pattern/deviations: Step-through pattern;Decreased stride length;Staggering left;Staggering right;Narrow base of support   Gait velocity interpretation: Below normal speed for age/gender General Gait Details: Pt with unsteady gait and assist needed for balance.  Stairs            Wheelchair Mobility    Modified Rankin (Stroke  Patients Only)       Balance Overall balance assessment: Needs assistance Sitting-balance support: No upper extremity supported;Feet supported Sitting balance-Leahy Scale: Normal     Standing balance support: No upper extremity supported Standing balance-Leahy Scale: Fair                               Pertinent Vitals/Pain Pain Assessment: No/denies pain    Home Living Family/patient expects to be discharged to:: Private residence Living Arrangements: Spouse/significant other Available Help at Discharge: Family;Available 24 hours/day Type of Home: House Home Access: Stairs to enter Entrance Stairs-Rails: Right Entrance Stairs-Number of Steps: 3-4 Home Layout: One level Home Equipment: None      Prior Function Level of Independence: Independent               Hand Dominance        Extremity/Trunk Assessment   Upper Extremity Assessment: Defer to OT evaluation           Lower Extremity Assessment: Overall WFL for tasks assessed         Communication   Communication: No difficulties  Cognition Arousal/Alertness: Awake/alert Behavior During Therapy: Impulsive Overall Cognitive Status: Impaired/Different from baseline Area of Impairment: Memory;Safety/judgement;Problem solving;Orientation Orientation Level: Disoriented to;Situation   Memory: Decreased recall of precautions;Decreased short-term memory   Safety/Judgement: Decreased awareness of safety;Decreased awareness of deficits   Problem Solving: Requires verbal cues      General Comments      Exercises        Assessment/Plan    PT Assessment Patient needs continued PT services  PT Diagnosis Difficulty walking;Altered mental status;Abnormality of gait   PT  Problem List Decreased balance;Decreased mobility;Decreased knowledge of use of DME;Decreased safety awareness;Decreased knowledge of precautions;Decreased cognition  PT Treatment Interventions DME instruction;Gait  training;Functional mobility training;Therapeutic activities;Therapeutic exercise;Balance training;Cognitive remediation;Patient/family education   PT Goals (Current goals can be found in the Care Plan section) Acute Rehab PT Goals Patient Stated Goal: go home PT Goal Formulation: With patient Time For Goal Achievement: 10/11/14 Potential to Achieve Goals: Good    Frequency Min 3X/week   Barriers to discharge        Co-evaluation               End of Session Equipment Utilized During Treatment: Gait belt Activity Tolerance: Patient tolerated treatment well Patient left: in bed;with call bell/phone within reach (Pt going to EEG in bed) Nurse Communication: Mobility status         Time: 1240-1309 PT Time Calculation (min) (ACUTE ONLY): 29 min   Charges:   PT Evaluation $Initial PT Evaluation Tier I: 1 Procedure PT Treatments $Gait Training: 8-22 mins   PT G Codes:        [name] 10-08-2014, 1:30 PM  [employer] PT [phone]

## 2014-10-05 LAB — GLUCOSE, CAPILLARY
Glucose-Capillary: 327 mg/dL — ABNORMAL HIGH (ref 65–99)
Glucose-Capillary: 251 mg/dL — ABNORMAL HIGH (ref 65–99)

## 2014-10-05 MED ORDER — LEVETIRACETAM 500 MG PO TABS: 500.0000 mg | ORAL_TABLET | Freq: Two times a day (BID) | ORAL | Status: DC

## 2014-10-05 MED ORDER — LACTULOSE 10 GM/15ML PO SOLN
10.0000 g | Freq: Two times a day (BID) | ORAL | Status: DC
  Administered 2014-10-05: 10 g via ORAL
  Filled 2014-10-05: qty 15

## 2014-10-05 MED ORDER — LACTULOSE 10 GM/15ML PO SOLN: 10.0000 g | Freq: Two times a day (BID) | ORAL | Status: DC

## 2014-10-05 MED ORDER — HEPARIN SOD (PORK) LOCK FLUSH 100 UNIT/ML IV SOLN
500.0000 [IU] | INTRAVENOUS | Status: AC | PRN
  Administered 2014-10-05: 500 [IU]

## 2014-10-05 NOTE — Clinical Social Work Note (Signed)
CSW received consult re competency and guardianship. CSW has reviewed chart and this appears to be an inappropriate consult. CSW signing off. Please consult again if necessary.    Liz Beach MSW, [place], [place], [id number]

## 2014-10-05 NOTE — Discharge Summary (Signed)
Physician Discharge Summary  [name] [id number] DOB: [birth year] DOA: 10/02/2014  PCP: Philis Fendt, MD  Admit date: 10/02/2014 Discharge date: 10/05/2014  Recommendations for Outpatient Follow-up:  1. Take lactulose twice daily as prescribed. This will help with reduction of ammonia level. 2. Take Keppra 500 mg twice daily.  Discharge Diagnoses:  Principal Problem:   Acute encephalopathy Active Problems:   Iron deficiency anemia   DM (diabetes mellitus), type 2, uncontrolled, with renal complications   Acute renal failure superimposed on stage 3 chronic kidney disease   Gastric AVM   GI bleed   Osler-Weber-Rendu disease   Chronic diastolic CHF (congestive heart failure)   Hyperammonemia   HTN (hypertension), benign   Chronic kidney disease, stage III (moderate)   Status epilepticus    Discharge Condition: stable   Diet recommendation: as tolerated   History of present illness:  74 y.o. 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 Patient was recently hospitalized for generalized weakness thought to be secondary to acute blood loss anemia from iron deficiency anemia (discharge 09/07/2014, 3 day hospital stay).   Patient presented to [facility] 10/02/14 with altered mental status. Apparently per family report there was a questionable seizure episode as well prior to the admission. In addition patient's husband reported that patient had episode of vomiting.  Patient was hemodynamically stable on the admission. Her labwork was significant for ammonia level of 327, creatinine 1.3 (baseline from recent admission is 1.29). CT head did not show acute intracranial abnormalities. Chest x-ray showed mild pulmonary vascular  congestion but no edema or consolidation. Patient was admitted for further evaluation of altered mental status. Neurology has seen the patient in consultation. Workup included EEG which was significant for status epilepticus. She was started on Keppra and has ad no further seizures observed during this hospital stay.  Hospital Course:   Assessment/Plan:    Principal problem: Acute encephalopathy / seizure disorder - EEG on this admission demonstrated status epilepticus in left frontal region. This is most likely cause of acute encephalopathy. Appreciate neurology following and their recommendations. - Unclear etiology of seizures but cause possibly be related to her history of AVMs, vasculitic disorder. She did have MRI back in July 2016 with findings that could relate to hepatic insufficiency, possible vasculitic disorders. She does have chronic vascular changes noted on MRI at that time. - Patient has received loading dose of Keppra and 5 in the section then started Keppra 500 mg IV every 12 hours. We will change to PO Q 12 hours on discharge. - Patient did have elevated ammonia level of 327 but her liver functions were within normal limits. She was started on lactulose. Alcohol level was within normal limits. On discharge, she will continue lactulose 10 g twice daily. - CT head did not show acute intracranial findings - Patient's mental status is much better this morning, she is alert, oriented to time, place and person. She wants to go home today because it is her husband's birthday tomorrow.  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 - No current indications for transfusion.  DM (diabetes mellitus), type 2, uncontrolled, with renal complications -  Patient was seen by diabetic coordinator during this hospital  stay. Appreciate diabetic coordinator recommendations. Patient's daughter at the bedside prefers that patient continues the same insulin regimen at home because she is not eating all that well her sugars fluctuate at home. Primary care physician will make adjustments to insulin regiment on outpatient basis. - HgA1c in 08/2014 7.3  Chronic kidney disease, stage III - Baseline creatinine 1.29 and on this admission 1.3, around patient's baseline range - We will hold lisinopril but patient can continue Lasix. Lisinopril and Lasix can contribute to worsening renal failure but her renal function is at baseline. Because blood pressure is 121/31 I think it's reasonable to stop lisinopril and continue Lasix only.  Essential hypertension - On discharge, continue Norvasc, Lasix and metoprolol. - Will hold off on lisinopril.  Chronic diastolic CHF (congestive heart failure) - 2-D echo on 07/06/14 showed EF 65-70% with grade 2 diastolic dysfunction.  - Compensated - Mild pulmonary vascular congestion seen on chest x-ray but no edema. 281   DVT Prophylaxis - Heparin subcutaneous ordered while pt in hospital    Code Status: Full.  Family Communication: Updated the patient's daughter at the bedside. They want her to go home today. Pt is medically stable for discharge today.    IV access:  Peripheral IV  Procedures and diagnostic studies:   Ct Head Wo Contrast 10/03/2014 Atrophy and chronic small vessel ischemic change. No acute intracranial abnormality. Electronically Signed By: Jeb Levering M.D. On: 10/03/2014 00:25   US Renal 10/03/2014 Normal sonographic appearance of the kidneys. No obstructive uropathy. Urinary bladder decompressed and not evaluated. Electronically Signed By: Jeb Levering M.D. On: 10/03/2014 02:23   Dg Chest Portable 1 View 10/02/2014 Cardiac enlargement with mild pulmonary vascular congestion. No edema or consolidation. Electronically  Signed By: Lucienne Capers M.D. 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   Medical Consultants:  Neurology  Other Consultants:  Physical therapy Nutrition Diabetic coordinator  IAnti-Infectives:   None    Signed:  Leisa Lenz, MD  [facility] 10/05/2014, 9:44 AM  Pager #: [phone]  Time spent in minutes: more than 30 minutes  Discharge Exam: Filed Vitals:   10/05/14 0555  BP: 121/31  Pulse: 62  Temp: 98.2 F (36.8 C)  Resp: 12   Filed Vitals:   10/04/14 0535 10/04/14 1428 10/04/14 2152 10/05/14 0555  BP: 130/48 130/42 118/37 121/31  Pulse: 51 56 60 62  Temp: 97.8 F (36.6 C) 98 F (36.7 C) 97.9 F (36.6 C) 98.2 F (36.8 C)  TempSrc: Oral Oral Oral Oral  Resp: 18 20 19 12   Height:      Weight:      SpO2: 100% 98% 100% 97%    General: Pt is alert, follows commands appropriately, not in acute distress Cardiovascular: Regular rate and rhythm, S1/S2 +, no murmurs Respiratory: Clear to auscultation bilaterally, no wheezing, no crackles, no rhonchi Abdominal: Soft, non tender, non distended, bowel sounds +, no guarding Extremities: no edema, no cyanosis, pulses palpable bilaterally DP and PT Neuro: Grossly nonfocal  Discharge Instructions  Discharge Instructions    Call MD for:  difficulty breathing, headache or visual disturbances    Complete by:  As directed      Call MD for:  persistant nausea and vomiting    Complete by:  As directed      Call MD for:  severe uncontrolled pain    Complete by:  As directed      Diet - low sodium heart healthy    Complete by:  As directed      Discharge instructions    Complete by:  As directed   1. Take lactulose twice daily as prescribed. This will help with reduction of ammonia level. 2. Take Keppra 500 mg twice daily.     Increase activity slowly     Complete by:  As directed             Medication List    STOP taking these medications        enalapril 20 MG tablet  Commonly known as:  VASOTEC      TAKE these medications        AMITIZA 24 MCG capsule  Generic drug:  lubiprostone  Take 24 mcg by mouth 2 (two) times daily.     amLODipine 5 MG tablet  Commonly known as:  NORVASC  Take 5 mg by mouth daily.     cyclobenzaprine 5 MG tablet  Commonly known as:  FLEXERIL  Take 1 tablet (5 mg total) by mouth 3 (three) times daily as needed for muscle spasms.     ferrous fumarate 325 (106 FE) MG Tabs tablet  Commonly known as:  HEMOCYTE - 106 mg FE  Take 1 tablet by mouth daily.     furosemide 40 MG tablet  Commonly known as:  LASIX  Take 40mg  twice daily for two days and then start taking 40mg  once daily.     insulin detemir 100 UNIT/ML injection  Commonly known as:  LEVEMIR  Inject 0.3 mLs (30 Units total) into the skin at bedtime.     lactulose 10 GM/15ML solution  Commonly known as:  CHRONULAC  Take 15 mLs (10 g total) by mouth 2 (two) times daily.     levETIRAcetam 500 MG tablet  Commonly known as:  KEPPRA  Take 1 tablet (500 mg total) by mouth 2 (two) times daily.     metoprolol succinate 25 MG 24 hr tablet  Commonly known as:  TOPROL XL  Take 0.5 tablets (12.5 mg total) by mouth daily.     oxymetazoline 0.05 % nasal spray  Commonly known as:  AFRIN  Place 1 spray into both nostrils 2 (two) times daily as needed (Epistaxis).     pantoprazole 40 MG tablet  Commonly known as:  PROTONIX  Take 1 tablet (40 mg total) by mouth 2 (two) times daily.     vitamin B-12 1000 MCG tablet  Commonly known as:  CYANOCOBALAMIN  Take 1,000 mcg by mouth every morning.           Follow-up Information    Follow up with AVBUERE,EDWIN A, MD. Schedule an appointment as soon as possible for a visit in 1 week.   Specialty:  Internal Medicine   Why:  Follow up appt after recent hospitalization   Contact information:    [place] [address] [phone]       Follow up with [facility]. Schedule an appointment as soon as possible for a visit in 2 weeks.   Why:  Follow up appt after recent hospitalization   Contact information:   [address] [place] [phone]       The results of significant diagnostics from this hospitalization (including imaging, microbiology, ancillary and laboratory) are listed below for reference.    Significant  Diagnostic Studies: Ct Head Wo Contrast  10/03/2014   CLINICAL DATA:  Altered mental status.  Lethargy.  EXAM: CT HEAD WITHOUT CONTRAST  TECHNIQUE: Contiguous axial images were obtained from the base of the skull through the vertex without intravenous contrast.  COMPARISON:  Head CT 08/27/2014, brain MRI 08/28/2014  FINDINGS: No intracranial hemorrhage, mass effect, or midline shift. No hydrocephalus. The basilar cisterns are patent. No evidence of territorial infarct. No intracranial fluid collection. Generalized atrophy and chronic small vessel ischemic change, stable from prior exam. Calvarium is intact. Included paranasal sinuses and mastoid air cells are well aerated. Previous opacification of the right maxillary sinus is no longer seen.  IMPRESSION: Atrophy and chronic small vessel ischemic change. No acute intracranial abnormality.   Electronically Signed   By: Jeb Levering M.D.   On: 10/03/2014 00:25   US Renal  10/03/2014   CLINICAL DATA:  Acute kidney injury.  EXAM: RENAL / URINARY TRACT ULTRASOUND COMPLETE  COMPARISON:  None.  FINDINGS: Right Kidney:  Length: 8.4 cm. Echogenicity within normal limits. No mass or hydronephrosis visualized.  Left Kidney:  Length: 9.0 cm. Echogenicity within normal limits. No mass or hydronephrosis visualized.  Bladder:  Decompressed and not evaluated.  IMPRESSION: Normal sonographic appearance of the kidneys. No obstructive uropathy. Urinary bladder  decompressed and not evaluated.   Electronically Signed   By: Jeb Levering M.D.   On: 10/03/2014 02:23   Dg Chest Portable 1 View  10/02/2014   CLINICAL DATA:  Altered mental status. Tired. Difficult to wake. History of diabetes and hypertension.  EXAM: PORTABLE CHEST - 1 VIEW  COMPARISON:  08/27/2014  FINDINGS: Shallow inspiration. Power port type central venous catheter with tip over the low SVC. No pneumothorax. Cardiac enlargement with mild pulmonary vascular congestion. No edema or consolidation. No blunting of costophrenic angles. No pneumothorax. Calcified and tortuous aorta.  IMPRESSION: Cardiac enlargement with mild pulmonary vascular congestion. No edema or consolidation.   Electronically Signed   By: Lucienne Capers M.D.   On: 10/02/2014 23:20    Microbiology: Recent Results (from the past 240 hour(s))  Blood culture (routine x 2)     Status: None (Preliminary result)   Collection Time: 10/02/14 11:40 PM  Result Value Ref Range Status   Specimen Description BLOOD RIGHT WRIST  Final   Special Requests BOTTLES DRAWN AEROBIC ONLY 3CC  Final   Culture NO GROWTH 1 DAY  Final   Report Status PENDING  Incomplete  Blood culture (routine x 2)     Status: None (Preliminary result)   Collection Time: 10/02/14 11:45 PM  Result Value Ref Range Status   Specimen Description BLOOD LEFT WRIST  Final   Special Requests BOTTLES DRAWN AEROBIC ONLY 2CC  Final   Culture NO GROWTH 1 DAY  Final   Report Status PENDING  Incomplete  MRSA PCR Screening     Status: None   Collection Time: 10/03/14  2:39 AM  Result Value Ref Range Status   MRSA by PCR NEGATIVE NEGATIVE Final    Comment:        The GeneXpert MRSA Assay (FDA approved for NASAL specimens only), is one component of a comprehensive MRSA colonization surveillance program. It is not intended to diagnose MRSA infection nor to guide or monitor treatment for MRSA infections.      Labs: Basic Metabolic Panel:  Recent Labs Lab  10/02/14 2348 10/02/14 2358 10/03/14 0321  NA 139 141 138  K 4.2 4.0 3.8  CL 103 103  102  CO2 23  --  26  GLUCOSE 209* 214* 216*  BUN 32* 35* 31*  CREATININE 1.38* 1.30* 1.26*  CALCIUM 9.7  --  9.5   Liver Function Tests:  Recent Labs Lab 10/03/14 0145 10/03/14 0321  AST 26 25  ALT 23 23  ALKPHOS 115 116  BILITOT 0.6 0.7  PROT 6.5 6.7  ALBUMIN 3.5 3.6   No results for input(s): LIPASE, AMYLASE in the last 168 hours.  Recent Labs Lab 10/02/14 2348 10/04/14 1153  AMMONIA 327* 80*   CBC:  Recent Labs Lab 10/02/14 2348  10/03/14 0321 10/03/14 0900 10/03/14 1331 10/03/14 1903 10/04/14 0127  WBC 6.1  < > 5.8 7.7 5.3 4.1 4.5  NEUTROABS 4.8  --   --   --   --   --   --   HGB 12.2  < > 11.8* 12.5 11.5* 11.4* 10.4*  HCT 37.4  < > 35.1* 38.0 35.5* 35.2* 32.2*  MCV 87.6  < > 85.6 86.6 87.2 86.5 87.7  PLT 214  < > 232 224 245 157 194  < > = values in this interval not displayed. Cardiac Enzymes: No results for input(s): CKTOTAL, CKMB, CKMBINDEX, TROPONINI in the last 168 hours. BNP: BNP (last 3 results)  Recent Labs  10/03/14 0321  BNP 100.0    ProBNP (last 3 results)  Recent Labs  07/27/14 1548  PROBNP 866.0*    CBG:  Recent Labs Lab 10/04/14 1152 10/04/14 1651 10/04/14 2151 10/05/14 0300 10/05/14 0801  GLUCAP 197* 252* 229* 327* 251*

## 2014-10-05 NOTE — Care Management Important Message (Signed)
Important Message  Patient Details  Name: [name] MRN: [id number] Date of Birth: 25-Feb-1940   Medicare Important Message Given:  Yes-second notification given    Zenon Mayo, RN 10/05/2014, 10:25 [place] Message  Patient Details  Name: [name] MRN: [id number] Date of Birth: October 17, 1940   Medicare Important Message Given:  Yes-second notification given    Zenon Mayo, RN 10/05/2014, 10:25 AM

## 2014-10-05 NOTE — Progress Notes (Signed)
Speech Language Pathology Treatment: Dysphagia  Patient Details Name: [name] MRN: [id number] DOB: 12-16-1940 Today's Date: 10/05/2014 Time: 0950-1001 SLP Time Calculation (min) (ACUTE ONLY): 11 min  Assessment / Plan / Recommendation Clinical Impression  Skilled treatment session focused on addressing dysphagia goals.  SLP facilitated session with set-up of Dys.1, Dys.2 textures and thin liquids via straw, which patient then consumed Mod I.  Patient report the current textures are her baseline and she was able to tell SLP how to prepare minced foods with minimal question cues.  Education complete and no further skilled SLP services are warranted at this time due to patient returning to baseline level of functioning; SLP signing off.    HPI Other Pertinent Information: 74 y.o. 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 CXR Cardiac enlargement with mild pulmonary vascular congestion. No edema or consolidation.   Pertinent Vitals Pain Assessment: No/denies pain  SLP Plan  All goals met    Recommendations Diet recommendations: Dysphagia 2 (fine chop);Thin liquid Liquids provided via: Cup;Straw Medication Administration: Whole meds with liquid Supervision: Patient able to self feed Compensations: Slow rate;Small sips/bites Postural Changes and/or Swallow Maneuvers: Seated upright 90 degrees              Oral Care Recommendations: Oral care BID Follow up Recommendations: None Plan: All goals met    GO    [name]., CCC-SLP [phone]  [name],[name] 10/05/2014, 10:03 AM

## 2014-10-05 NOTE — Progress Notes (Signed)
[name] to be D/C'd Home per MD order.  Discussed with the patient and all questions fully answered.  VSS, Skin clean, dry and intact without evidence of skin break down, no evidence of skin tears noted.   An After Visit Summary was printed and given to the patient. Patient received prescription.  D/c education completed with patient/family including follow up instructions, medication list, d/c activities limitations if indicated, with other d/c instructions as indicated by MD - patient able to verbalize understanding, all questions fully answered.   Patient instructed to return to ED, call 911, or call MD for any changes in condition.   Patient escorted via [place], and D/C home via private auto.  L'ESPERANCE, Khyri Hinzman C 10/05/2014 10:48 AM

## 2014-10-05 NOTE — Progress Notes (Signed)
Porta cath deaccessed. Flushed with 10cc NS followed by Heparin 5ml (100u/ml). No bleeding to site, band aid to site for comfort. [name] M 

## 2014-10-05 NOTE — Discharge Instructions (Signed)
Seizure, Adult A seizure means there is unusual activity in the brain. A seizure can cause changes in attention or behavior. Seizures often cause shaking (convulsions). Seizures often last from 30 seconds to 2 minutes. HOME CARE   If you are given medicines, take them exactly as told by your doctor.  Keep all doctor visits as told.  Do not swim or drive until your doctor says it is okay.  Teach others what to do if you have a seizure. They should:  Lay you on the ground.  Put a cushion under your head.  Loosen any tight clothing around your neck.  Turn you on your side.  Stay with you until you get better. GET HELP RIGHT AWAY IF:   The seizure lasts longer than 2 to 5 minutes.  The seizure is very bad.  The person does not wake up after the seizure.  The person's attention or behavior changes. Drive the person to the emergency room or call your local emergency services (911 in [place] MAKE SURE YOU:   Understand these instructions.  Will watch your condition.  Will get help right away if you are not doing well or get worse. Document Released: 07/15/2007 Document Revised: 04/20/2011 Document Reviewed: 01/14/2011 [facility] Patient Information 2015 [place], [place]. This information is not intended to replace advice given to you by your health care provider. Make sure you discuss any questions you have with your health care provider. Levetiracetam tablets What is this medicine? LEVETIRACETAM (lee ve tye RA se tam) is an antiepileptic drug. It is used with other medicines to treat certain types of seizures. This medicine may be used for other purposes; ask your health care provider or pharmacist if you have questions. COMMON BRAND NAME(S): Keppra What should I tell my health care provider before I take this medicine? They need to know if you have any of these conditions: -kidney disease -suicidal thoughts, plans, or attempt; a previous suicide attempt by you or a family  member -an unusual or allergic reaction to levetiracetam, other medicines, foods, dyes, or preservatives -pregnant or trying to get pregnant -breast-feeding How should I use this medicine? Take this medicine by mouth with a glass of water. Follow the directions on the prescription label. Swallow the tablets whole. Do not crush or chew this medicine. You may take this medicine with or without food. Take your doses at regular intervals. Do not take your medicine more often than directed. Do not stop taking this medicine or any of your seizure medicines unless instructed by your doctor or health care professional. Stopping your medicine suddenly can increase your seizures or their severity. A special MedGuide will be given to you by the pharmacist with each prescription and refill. Be sure to read this information carefully each time. Contact your pediatrician or health care professional regarding the use of this medication in children. While this drug may be prescribed for children as young as 24 years of age for selected conditions, precautions do apply. Overdosage: If you think you have taken too much of this medicine contact a poison control center or emergency room at once. NOTE: This medicine is only for you. Do not share this medicine with others. What if I miss a dose? If you miss a dose, take it as soon as you can. If it is almost time for your next dose, take only that dose. Do not take double or extra doses. What may interact with this medicine? This medicine may interact with the following medications: -  carbamazepine -colesevelam -probenecid -sevelamer This list may not describe all possible interactions. Give your health care provider a list of all the medicines, herbs, non-prescription drugs, or dietary supplements you use. Also tell them if you smoke, drink alcohol, or use illegal drugs. Some items may interact with your medicine. What should I watch for while using this  medicine? Visit your doctor or health care professional for a regular check on your progress. Wear a medical identification bracelet or chain to say you have epilepsy, and carry a card that lists all your medications. It is important to take this medicine exactly as instructed by your health care professional. When first starting treatment, your dose may need to be adjusted. It may take weeks or months before your dose is stable. You should contact your doctor or health care professional if your seizures get worse or if you have any new types of seizures. You may get drowsy or dizzy. Do not drive, use machinery, or do anything that needs mental alertness until you know how this medicine affects you. Do not stand or sit up quickly, especially if you are an older patient. This reduces the risk of dizzy or fainting spells. Alcohol may interfere with the effect of this medicine. Avoid alcoholic drinks. The use of this medicine may increase the chance of suicidal thoughts or actions. Pay special attention to how you are responding while on this medicine. Any worsening of mood, or thoughts of suicide or dying should be reported to your health care professional right away. Women who become pregnant while using this medicine may enroll in the [place] Pregnancy Registry by calling [phone]. This registry collects information about the safety of antiepileptic drug use during pregnancy. What side effects may I notice from receiving this medicine? Side effects you should report to your doctor or health care professional as soon as possible: -allergic reactions like skin rash, itching or hives, swelling of the face, lips, or tongue -breathing problems -dark urine -general ill feeling or flu-like symptoms -problems with balance, talking, walking -unusually weak or tired -worsening of mood, thoughts or actions of suicide or dying -yellowing of the eyes or skin Side effects that  usually do not require medical attention (report to your doctor or health care professional if they continue or are bothersome): -diarrhea -dizzy, drowsy -headache -loss of appetite This list may not describe all possible side effects. Call your doctor for medical advice about side effects. You may report side effects to FDA at 1-800-FDA-1088. Where should I keep my medicine? Keep out of reach of children. Store at room temperature between 15 and 30 degrees C (59 and 86 degrees F). Throw away any unused medicine after the expiration date. NOTE: This sheet is a summary. It may not cover all possible information. If you have questions about this medicine, talk to your doctor, pharmacist, or health care provider.  2015, Elsevier/Gold Standard. (2012-12-20 08:42:48) Lactulose oral solution What is this medicine? LACTULOSE (LAK tyoo lose) is a laxative derived from lactose. It helps to treat chronic constipation and to treat or prevent hepatic encephalopathy or coma. These are brain disorders that result from liver disease. This medicine may be used for other purposes; ask your health care provider or pharmacist if you have questions. COMMON BRAND NAME(S): Acilac, Cephulac, Cholac, Chronulac, Constilac, Constulose, Enulose, Generlac What should I tell my health care provider before I take this medicine? They need to know if you have any of these conditions: -need a galactose-free diet -  scheduled for surgery -an unusual or allergic reaction to lactulose, other sugars, medicines, foods, dyes or preservatives -pregnant or trying to get pregnant -breast-feeding How should I use this medicine? Take this medicine mouth. Follow the directions on the prescription label. Shake well before using. Use a specially marked spoon or container to measure your medicine. Ask your pharmacist if you do not have one. Household spoons are not accurate. Take your doses at regular intervals. Do not take your medicine  more often than directed. You may be directed to take this medicine rectally. If so, you must follow specific directions from your doctor or healthcare professional. Please contact him or her. Talk to your pediatrician regarding the use of this medicine in children. Special care may be needed. Overdosage: If you think you have taken too much of this medicine contact a poison control center or emergency room at once. NOTE: This medicine is only for you. Do not share this medicine with others. What if I miss a dose? If you miss a dose, take it as soon as you can. If it is almost time for your next dose, take only that dose. Do not take double or extra doses. What may interact with this medicine? -antacids -neomycin -other laxatives This list may not describe all possible interactions. Give your health care provider a list of all the medicines, herbs, non-prescription drugs, or dietary supplements you use. Also tell them if you smoke, drink alcohol, or use illegal drugs. Some items may interact with your medicine. What should I watch for while using this medicine? This medicine may not produce any result for 24 to 48 hours. Do not take this medicine for longer than directed by your doctor or health care professional. Drink plenty of water with each dose of this medicine. What side effects may I notice from receiving this medicine? Side effects that you should report to your doctor or health care professional as soon as possible: -diarrhea Side effects that usually do not require medical attention (report to your doctor or health care professional if they continue or are bothersome): -belching, flatulence -nausea or vomiting -stomach pain or discomfort This list may not describe all possible side effects. Call your doctor for medical advice about side effects. You may report side effects to FDA at 1-800-FDA-1088. Where should I keep my medicine? Keep out of the reach of children. This medicine  may darken in color under normal storage conditions. This is because of the sugar solution and does not affect the way the medicine works. If the solution becomes extremely dark in color, contact your health care professional before use. Store at room temperature between 15 and 30 degrees C (59 and 86 degrees F). Do not freeze. Keep container tightly closed. Throw away any unused medicine after the expiration date. NOTE: This sheet is a summary. It may not cover all possible information. If you have questions about this medicine, talk to your doctor, pharmacist, or health care provider.  2015, Elsevier/Gold Standard. (2007-08-02 16:04:57)

## 2014-10-05 NOTE — Progress Notes (Signed)
Inpatient Diabetes Program Recommendations  AACE/ADA: New Consensus Statement on Inpatient Glycemic Control (2013)  Target Ranges:  Prepandial:   less than 140 mg/dL      Peak postprandial:   less than 180 mg/dL (1-2 hours)      Critically ill patients:  140 - 180 mg/dL   Results for [name], [name] (MRN [id number]) as of 10/05/2014 09:06  Ref. Range 10/04/2014 11:52 10/04/2014 16:51 10/04/2014 21:51 10/05/2014 03:00 10/05/2014 08:01  Glucose-Capillary Latest Ref Range: 65-99 mg/dL 197 (H) 252 (H) 229 (H) 327 (H) 251 (H)   Diabetes history: DM 2 Outpatient Diabetes medications: Levemir 30 units QAM, 20 units QPM Current orders for Inpatient glycemic control: Levemir 20 units QHS, Novolog Sensitive  Inpatient Diabetes Program Recommendations Insulin - Basal: Glucose in the 200s. Please consider increasing HS basal insulin to Levemir 26 units QHS. Consider also ordering Levemir 15 units of basal QAM.  Thanks,  Tama Headings RN, MSN, [facility] Inpatient Diabetes Coordinator Team Pager [phone]

## 2014-10-05 NOTE — Progress Notes (Signed)
Dr. Broadus [name] paged about clarifying medication orders. Patient asking if she still needs enemas. Patient passed SLP eval to take pills. Awaiting MD orders. Foley catheter discontinued.

## 2014-10-05 NOTE — Care Management Note (Addendum)
Case Management Note  Patient Details  Name: [name] MRN: [id number] Date of Birth: 01/06/1941  Subjective/Objective:         Patient for dc today, [place] notified. Patient only wants HHPT, she does not want HHOT, aide and RN, [facility] aware.             Action/Plan:   Expected Discharge Date:                  Expected Discharge Plan:  [place]  In-House Referral:     Discharge planning Services  CM Consult  Post Acute Care Choice:  Home Health Choice offered to:  Patient  DME Arranged:    DME Agency:     HH Arranged:  PT [place]:  [place]  Status of Service:  Completed, signed off  Medicare Important Message Given:  Yes-second notification given Date Medicare IM Given:    Medicare IM give by:    Date Additional Medicare IM Given:    Additional Medicare Important Message give by:     If discussed at [place] of Stay Meetings, dates discussed:    Additional Comments:  Zenon Mayo, RN 10/05/2014, 10:29 AM

## 2014-10-08 ENCOUNTER — Other Ambulatory Visit (HOSPITAL_BASED_OUTPATIENT_CLINIC_OR_DEPARTMENT_OTHER): Payer: Medicare Other

## 2014-10-08 DIAGNOSIS — I780 Hereditary hemorrhagic telangiectasia: Secondary | ICD-10-CM | POA: Diagnosis not present

## 2014-10-08 DIAGNOSIS — D500 Iron deficiency anemia secondary to blood loss (chronic): Secondary | ICD-10-CM

## 2014-10-08 DIAGNOSIS — K31811 Angiodysplasia of stomach and duodenum with bleeding: Secondary | ICD-10-CM

## 2014-10-08 DIAGNOSIS — K31819 Angiodysplasia of stomach and duodenum without bleeding: Secondary | ICD-10-CM

## 2014-10-08 DIAGNOSIS — D509 Iron deficiency anemia, unspecified: Secondary | ICD-10-CM

## 2014-10-08 LAB — CULTURE, BLOOD (ROUTINE X 2)
CULTURE: NO GROWTH
Culture: NO GROWTH

## 2014-10-08 LAB — CBC WITH DIFFERENTIAL/PLATELET
BASO%: 0.3 % (ref 0.0–2.0)
Basophils Absolute: 0 10*3/uL (ref 0.0–0.1)
EOS ABS: 0.1 10*3/uL (ref 0.0–0.5)
EOS%: 1.8 % (ref 0.0–7.0)
HCT: 32.4 % — ABNORMAL LOW (ref 34.8–46.6)
HEMOGLOBIN: 10.6 g/dL — AB (ref 11.6–15.9)
LYMPH%: 11.9 % — ABNORMAL LOW (ref 14.0–49.7)
MCH: 29 pg (ref 25.1–34.0)
MCHC: 32.7 g/dL (ref 31.5–36.0)
MCV: 88.8 fL (ref 79.5–101.0)
MONO#: 0.4 10*3/uL (ref 0.1–0.9)
MONO%: 6.3 % (ref 0.0–14.0)
NEUT%: 79.7 % — ABNORMAL HIGH (ref 38.4–76.8)
NEUTROS ABS: 4.8 10*3/uL (ref 1.5–6.5)
Platelets: 213 10*3/uL (ref 145–400)
RBC: 3.65 10*6/uL — ABNORMAL LOW (ref 3.70–5.45)
RDW: 18.5 % — AB (ref 11.2–14.5)
WBC: 6.1 10*3/uL (ref 3.9–10.3)
lymph#: 0.7 10*3/uL — ABNORMAL LOW (ref 0.9–3.3)

## 2014-10-08 LAB — HOLD TUBE, BLOOD BANK

## 2014-10-08 LAB — FERRITIN CHCC: Ferritin: 184 ng/ml (ref 9–269)

## 2014-10-09 ENCOUNTER — Telehealth: Payer: Self-pay | Admitting: *Deleted

## 2014-10-09 DIAGNOSIS — I780 Hereditary hemorrhagic telangiectasia: Secondary | ICD-10-CM | POA: Diagnosis not present

## 2014-10-09 DIAGNOSIS — I82409 Acute embolism and thrombosis of unspecified deep veins of unspecified lower extremity: Secondary | ICD-10-CM | POA: Diagnosis not present

## 2014-10-09 DIAGNOSIS — I5030 Unspecified diastolic (congestive) heart failure: Secondary | ICD-10-CM | POA: Diagnosis not present

## 2014-10-09 DIAGNOSIS — E1129 Type 2 diabetes mellitus with other diabetic kidney complication: Secondary | ICD-10-CM | POA: Diagnosis not present

## 2014-10-09 DIAGNOSIS — D638 Anemia in other chronic diseases classified elsewhere: Secondary | ICD-10-CM | POA: Diagnosis not present

## 2014-10-09 DIAGNOSIS — Z794 Long term (current) use of insulin: Secondary | ICD-10-CM | POA: Diagnosis not present

## 2014-10-09 DIAGNOSIS — G40909 Epilepsy, unspecified, not intractable, without status epilepticus: Secondary | ICD-10-CM | POA: Diagnosis not present

## 2014-10-09 DIAGNOSIS — I129 Hypertensive chronic kidney disease with stage 1 through stage 4 chronic kidney disease, or unspecified chronic kidney disease: Secondary | ICD-10-CM | POA: Diagnosis not present

## 2014-10-09 DIAGNOSIS — N183 Chronic kidney disease, stage 3 (moderate): Secondary | ICD-10-CM | POA: Diagnosis not present

## 2014-10-09 NOTE — Telephone Encounter (Signed)
-----   Message from [name], MD sent at 10/08/2014  5:50 PM EDT ----- Call pt: blood count stable. No need for transfusion at this time

## 2014-10-09 NOTE — Telephone Encounter (Signed)
Pt called about her lab results; she had already seen her them - Hgb @ 10.6.  Informed her no transfusion @ this time per Dr [name]. Stated she feels better after recent hospital hospitalization.

## 2014-10-11 DIAGNOSIS — G40909 Epilepsy, unspecified, not intractable, without status epilepticus: Secondary | ICD-10-CM | POA: Diagnosis not present

## 2014-10-11 DIAGNOSIS — Z794 Long term (current) use of insulin: Secondary | ICD-10-CM | POA: Diagnosis not present

## 2014-10-11 DIAGNOSIS — D638 Anemia in other chronic diseases classified elsewhere: Secondary | ICD-10-CM | POA: Diagnosis not present

## 2014-10-11 DIAGNOSIS — I780 Hereditary hemorrhagic telangiectasia: Secondary | ICD-10-CM | POA: Diagnosis not present

## 2014-10-11 DIAGNOSIS — N183 Chronic kidney disease, stage 3 (moderate): Secondary | ICD-10-CM | POA: Diagnosis not present

## 2014-10-11 DIAGNOSIS — I5030 Unspecified diastolic (congestive) heart failure: Secondary | ICD-10-CM | POA: Diagnosis not present

## 2014-10-11 DIAGNOSIS — I82409 Acute embolism and thrombosis of unspecified deep veins of unspecified lower extremity: Secondary | ICD-10-CM | POA: Diagnosis not present

## 2014-10-11 DIAGNOSIS — I129 Hypertensive chronic kidney disease with stage 1 through stage 4 chronic kidney disease, or unspecified chronic kidney disease: Secondary | ICD-10-CM | POA: Diagnosis not present

## 2014-10-11 DIAGNOSIS — E1129 Type 2 diabetes mellitus with other diabetic kidney complication: Secondary | ICD-10-CM | POA: Diagnosis not present

## 2014-10-16 DIAGNOSIS — R569 Unspecified convulsions: Secondary | ICD-10-CM | POA: Diagnosis not present

## 2014-10-16 DIAGNOSIS — K219 Gastro-esophageal reflux disease without esophagitis: Secondary | ICD-10-CM | POA: Diagnosis not present

## 2014-10-16 DIAGNOSIS — I10 Essential (primary) hypertension: Secondary | ICD-10-CM | POA: Diagnosis not present

## 2014-10-16 DIAGNOSIS — E1140 Type 2 diabetes mellitus with diabetic neuropathy, unspecified: Secondary | ICD-10-CM | POA: Diagnosis not present

## 2014-10-17 DIAGNOSIS — I82409 Acute embolism and thrombosis of unspecified deep veins of unspecified lower extremity: Secondary | ICD-10-CM | POA: Diagnosis not present

## 2014-10-17 DIAGNOSIS — N183 Chronic kidney disease, stage 3 (moderate): Secondary | ICD-10-CM | POA: Diagnosis not present

## 2014-10-17 DIAGNOSIS — I129 Hypertensive chronic kidney disease with stage 1 through stage 4 chronic kidney disease, or unspecified chronic kidney disease: Secondary | ICD-10-CM | POA: Diagnosis not present

## 2014-10-17 DIAGNOSIS — D638 Anemia in other chronic diseases classified elsewhere: Secondary | ICD-10-CM | POA: Diagnosis not present

## 2014-10-17 DIAGNOSIS — E1129 Type 2 diabetes mellitus with other diabetic kidney complication: Secondary | ICD-10-CM | POA: Diagnosis not present

## 2014-10-17 DIAGNOSIS — I5030 Unspecified diastolic (congestive) heart failure: Secondary | ICD-10-CM | POA: Diagnosis not present

## 2014-10-17 DIAGNOSIS — I780 Hereditary hemorrhagic telangiectasia: Secondary | ICD-10-CM | POA: Diagnosis not present

## 2014-10-17 DIAGNOSIS — G40909 Epilepsy, unspecified, not intractable, without status epilepticus: Secondary | ICD-10-CM | POA: Diagnosis not present

## 2014-10-17 DIAGNOSIS — Z794 Long term (current) use of insulin: Secondary | ICD-10-CM | POA: Diagnosis not present

## 2014-10-18 ENCOUNTER — Ambulatory Visit (INDEPENDENT_AMBULATORY_CARE_PROVIDER_SITE_OTHER): Payer: Medicare Other | Admitting: Neurology

## 2014-10-18 ENCOUNTER — Encounter: Payer: Self-pay | Admitting: Neurology

## 2014-10-18 VITALS — BP 115/58 | HR 81 | Ht 61.0 in | Wt 135.0 lb

## 2014-10-18 DIAGNOSIS — G40901 Epilepsy, unspecified, not intractable, with status epilepticus: Secondary | ICD-10-CM

## 2014-10-18 DIAGNOSIS — R569 Unspecified convulsions: Secondary | ICD-10-CM

## 2014-10-18 DIAGNOSIS — G40301 Generalized idiopathic epilepsy and epileptic syndromes, not intractable, with status epilepticus: Secondary | ICD-10-CM | POA: Diagnosis not present

## 2014-10-18 MED ORDER — LEVETIRACETAM 500 MG PO TABS: 500.0000 mg | ORAL_TABLET | Freq: Two times a day (BID) | ORAL | Status: DC

## 2014-10-18 NOTE — Patient Instructions (Signed)
Overall you are doing fairly well but I do want to suggest a few things today:   Remember to drink plenty of fluid, eat healthy meals and do not skip any meals. Try to eat protein with a every meal and eat a healthy snack such as fruit or nuts in between meals. Try to keep a regular sleep-wake schedule and try to exercise daily, particularly in the form of walking, 20-30 minutes a day, if you can.   As far as your medications are concerned, I would like to suggest; Continue Keppra  I would like to see you back in 3 months, sooner if we need to. Please call us with any interim questions, concerns, problems, updates or refill requests.   Please also call us for any test results so we can go over those with you on the phone.  My clinical assistant and will answer any of your questions and relay your messages to me and also relay most of my messages to you.   Our phone number is [phone]. We also have an after hours call service for urgent matters and there is a physician on-call for urgent questions. For any emergencies you know to call 911 or go to the nearest emergency room

## 2014-10-18 NOTE — Progress Notes (Signed)
[facility]    Provider:  Dr Jaynee Eagles Referring Provider: Nolene Ebbs, MD Primary Care Physician:  Philis Fendt, MD  CC:  seizure  HPI:  [name] is a 74 y.o. female here as a referral from Dr. Jeanie Cooks for seizure in the setting of hyperammonemia. She has a past medical history of hypertension, congestive heart failure, diabetes, acute encephalopathy with status epilepticus, acute renal failure superimposed on stage III chronic kidney disease, urinary tract infections, hyperammonemia.  Patient here with husband. She was admitted with an episode of seizure-like activity. Husband provides assist of the information. He describes the incident. She was shaking her hands and head, trying to catch her breath. All information provided by husband. Lasted a few minutes. She was not responsive. It was on a Tuesday. Then she slept for 5 hours afterwards. Then she vomited while sleeping and then they called 911 and went to the emergency room. This is not the first time it has happened. It has happened in the past. Husband had seen the jerking of the limbs and then she would sleep a while and wake up. He says he has seen in twice in the last year. No family history of seizures. She is much better now. Therapy is coming to her house. Not having any side effects from the medication. She has had episodes of AMS and confusion in the past.    Reviewed notes, labs and imaging from outside physicians, which showed:  MRI of the brain 08/2014 personally reviewed images and agree with following report. : 1. No acute infarct identified. 2. Increased basal ganglia intrinsic T1 signal which can be seen with hepatic insufficiency, cirrhosis, portosystemic shunt, portal vein thrombosis, total parenteral nutrition, and is less often seen with hyperglycemia or other metabolic disturbances such as Wilson's disease. Correlation with serum ammonia levels and liver function tests is recommended. 3.  Advanced but nonspecific cerebral white matter signal changes. Most commonly this is due to chronic small vessel disease. Other considerations include hypercoagulable state, vasculitis, , prior infection or demyelination.  CBC showed anemia, anemia while hospitalized 327. Hepatic function was normal. One month previous she had a GI bleed. Lactulose was started for elevated ammonia. An EEG was ordered which was concerning for frontal status epilepticus. Hyperammonemia was thought to be the driver of the seizures however she was started on Keppra. She was discharged with the diagnosis of acute encephalopathy and hyperammonemia, she was discharged on Keppra 500 mg twice daily.  Patient presented to the emergency room in late August with altered mental status and lethargy. CT of the head imaging was unremarkable patient was afebrile, no leukocytosis, ammonia was 327, BUN was 32 and creatinine was 1.38.     Review of Systems: Patient complains of symptoms per HPI as well as the following symptoms: Fatigue, memory loss, confusion, constipation, change in appetite. Pertinent negatives per HPI. All others negative.   Social History   Social History  . Marital Status: Married    Spouse Name: [name]  . Number of Children: 0  . Years of Education: 9   Occupational History  . Retired Engineer, manufacturing systems    Social History Main Topics  . Smoking status: Former Smoker -- 2 years    Types: Cigarettes    Quit date: 02/10/1971  . Smokeless tobacco: Never Used     Comment: 09/15/2012 "smoked 50-60 yr ago"  . Alcohol Use: No  . Drug Use: No  . Sexual Activity: No   Other Topics Concern  .  Not on file   Social History Narrative   Married, lives with husband, [name].  Ambulates without assistance.     Caffeine use: none    Family History  Problem Relation Age of Onset  . Breast cancer    . Malignant hyperthermia Neg Hx   . Stroke Father     Past Medical History  Diagnosis Date  . Anemia   . S/P  endoscopy     with laser treatment  08/19/2006  . History of epistaxis   . Hyperlipidemia   . Telangiectasia     Gastric   . Lichen planus     Both lower extremities  . GERD (gastroesophageal reflux disease)   . Heart murmur   . CHF (congestive heart failure)   . Type II diabetes mellitus     insulin requiring.  Marland Kitchen History of blood transfusion "several"  . Chronic diastolic CHF (congestive heart failure) 10/03/2013  . HTN (hypertension), benign 03/02/2012  . Family history of anesthesia complication     "niece has a hard time coming out" (09/15/2012)  . Pneumonia 1990's X 2  . Seizures     "they think she might be having seizures; they are checking her for that now" (10/02/2014)    Past Surgical History  Procedure Laterality Date  . Nose surgery      "for bleeding"   . Savory dilation  02/26/2011    Procedure: SAVORY DILATION;  Surgeon: [name], MD;  Location: [name];  Service: Endoscopy;  Laterality: N/A;  c-arm needed  . Esophagogastroduodenoscopy  02/26/2011    Procedure: ESOPHAGOGASTRODUODENOSCOPY (EGD);  Surgeon: [name], MD;  Location: [name];  Service: Endoscopy;  Laterality: N/A;  . Esophagogastroduodenoscopy N/A 11/08/2012    Procedure: ESOPHAGOGASTRODUODENOSCOPY (EGD);  Surgeon: [name], MD;  Location: [name];  Service: Endoscopy;  Laterality: N/A;  . Cataract extraction      "I think it was just one eye"  . Esophagogastroduodenoscopy N/A 10/04/2013    Procedure: ESOPHAGOGASTRODUODENOSCOPY (EGD);  Surgeon: Winfield Cunas., MD;  Location: [name];  Service: Endoscopy;  Laterality: N/A;  with APC on stand-by  . Esophagogastroduodenoscopy N/A 07/06/2014    Procedure: ESOPHAGOGASTRODUODENOSCOPY (EGD);  Surgeon: Clarene Essex, MD;  Location: [name];  Service: Endoscopy;  Laterality: N/A;  . Hot hemostasis N/A 07/06/2014    Procedure: HOT HEMOSTASIS (ARGON PLASMA COAGULATION/BICAP);  Surgeon: Clarene Essex, MD;  Location: [name];   Service: Endoscopy;  Laterality: N/A;  . Esophagogastroduodenoscopy N/A 09/05/2014    Procedure: ESOPHAGOGASTRODUODENOSCOPY (EGD);  Surgeon: Laurence Spates, MD;  Location: [name];  Service: Endoscopy;  Laterality: N/A;  APC on standby to control bleeding    Current Outpatient Prescriptions  Medication Sig Dispense Refill  . AMITIZA 24 MCG capsule Take 24 mcg by mouth 2 (two) times daily.    Marland Kitchen amitriptyline (ELAVIL) 25 MG tablet Take 25 mg by mouth daily.    Marland Kitchen amLODipine (NORVASC) 5 MG tablet Take 5 mg by mouth daily.    . cyclobenzaprine (FLEXERIL) 5 MG tablet Take 1 tablet (5 mg total) by mouth 3 (three) times daily as needed for muscle spasms. 15 tablet 0  . enalapril (VASOTEC) 20 MG tablet Take 20 mg by mouth daily.    . ferrous fumarate (HEMOCYTE - 106 MG FE) 325 (106 FE) MG TABS tablet Take 1 tablet by mouth daily.    . furosemide (LASIX) 40 MG tablet Take 40mg  twice daily for two days and then start taking 40mg   once daily. (Patient taking differently: Take 40 mg by mouth daily. ) 90 tablet 3  . insulin detemir (LEVEMIR) 100 UNIT/ML injection Inject 0.3 mLs (30 Units total) into the skin at bedtime. (Patient taking differently: Inject 20-30 Units into the skin 2 (two) times daily. 30 units in the Morning, and 20 units at Bedtime) 10 mL 11  . lactulose (CHRONULAC) 10 GM/15ML solution Take 15 mLs (10 g total) by mouth 2 (two) times daily. 240 mL 0  . levETIRAcetam (KEPPRA) 500 MG tablet Take 1 tablet (500 mg total) by mouth 2 (two) times daily. 60 tablet 6  . metoprolol succinate (TOPROL XL) 25 MG 24 hr tablet Take 0.5 tablets (12.5 mg total) by mouth daily. 30 tablet 6  . oxymetazoline (AFRIN) 0.05 % nasal spray Place 1 spray into both nostrils 2 (two) times daily as needed (Epistaxis). 30 mL 0  . pantoprazole (PROTONIX) 40 MG tablet Take 1 tablet (40 mg total) by mouth 2 (two) times daily. 60 tablet 1  . vitamin B-12 (CYANOCOBALAMIN) 1000 MCG tablet Take 1,000 mcg by mouth every morning.       No current facility-administered medications for this visit.    Allergies as of 10/18/2014 - Review Complete 10/18/2014  Allergen Reaction Noted  . Aspirin Nausea And Vomiting 05/12/2006    Vitals: BP 115/58 mmHg  Pulse 81  Ht 5\' 1"  (1.549 m)  Wt 135 lb (61.236 kg)  BMI 25.52 kg/m2 Last Weight:  Wt Readings from Last 1 Encounters:  10/18/14 135 lb (61.236 kg)   Last Height:   Ht Readings from Last 1 Encounters:  10/18/14 5\' 1"  (1.549 m)    Physical exam: Exam: Gen: NAD, conversant                  CV: RRR, no MRG. No Carotid Bruits. No peripheral edema, warm, nontender Eyes: Conjunctivae clear without exudates or hemorrhage  Neuro: Detailed Neurologic Exam  Speech:    Speech is normal; fluent and spontaneous with normal comprehension.  Cognition:    The patient is oriented to person, place, and time;     Recent memory impaired and remote memory intact;     language without aphasia or dysarthria;     Impaired attention, concentration,     fund of knowledge - she can't remember who is running for president Cranial Nerves:    The pupils are equal, round, and reactive to light. Attempted fundoscopic exam, could not visualize due to small pupils. . Visual fields are full to finger confrontation. Extraocular movements are intact. Trigeminal sensation is intact and the muscles of mastication are normal. The face is symmetric(bilat ptosis chronic). The palate elevates in the midline. Hearing intact. Voice is normal. Shoulder shrug is normal. The tongue has normal motion without fasciculations.   Coordination:    Normal finger to nose and heel to shin. Normal rapid alternating movements.   Gait:    Heel-toe and tandem gait are normal.   Motor Observation:    No asymmetry, no atrophy, and no involuntary movements noted. Tone:    Normal muscle tone.    Posture:    Posture is normal. normal erect    Strength: Mild bilat hip flexion weakness otherwise strength is  V/V in the upper and lower limbs.      Sensation: intact to LT     Reflex Exam:  DTR's: absent AJs    Toes:    The toes are downgoing bilaterally.   Clonus:  Clonus is absent.      Assessment/Plan:   74 y.o. female here as a referral from Dr. Jeanie Cooks for seizure in the setting of hyperammonemia. She has a past medical history of hypertension, congestive heart failure, diabetes, acute encephalopathy with status epilepticus, acute renal failure superimposed on stage III chronic kidney disease, urinary tract infections. 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 She was started on Keppra. It was thought that the hyperammonemia was likely the driving factor for for this seizure which could be considered provoked however husband says he has witnessed other seizures in the past so at this point I do feel she should stay on the [place]. She is tolerating Keppra well. We'll order Keppra level today and see patient back often. Advised to call me should patient have any repeat symptoms or proceed directly to the emergency room.    Sarina Ill, MD  [facility] [address] [place], [place] [address]  Phone [phone] Fax [phone]

## 2014-10-19 DIAGNOSIS — G40909 Epilepsy, unspecified, not intractable, without status epilepticus: Secondary | ICD-10-CM | POA: Diagnosis not present

## 2014-10-19 DIAGNOSIS — E1129 Type 2 diabetes mellitus with other diabetic kidney complication: Secondary | ICD-10-CM | POA: Diagnosis not present

## 2014-10-19 DIAGNOSIS — D638 Anemia in other chronic diseases classified elsewhere: Secondary | ICD-10-CM | POA: Diagnosis not present

## 2014-10-19 DIAGNOSIS — N183 Chronic kidney disease, stage 3 (moderate): Secondary | ICD-10-CM | POA: Diagnosis not present

## 2014-10-19 DIAGNOSIS — Z794 Long term (current) use of insulin: Secondary | ICD-10-CM | POA: Diagnosis not present

## 2014-10-19 DIAGNOSIS — I82409 Acute embolism and thrombosis of unspecified deep veins of unspecified lower extremity: Secondary | ICD-10-CM | POA: Diagnosis not present

## 2014-10-19 DIAGNOSIS — I780 Hereditary hemorrhagic telangiectasia: Secondary | ICD-10-CM | POA: Diagnosis not present

## 2014-10-19 DIAGNOSIS — I129 Hypertensive chronic kidney disease with stage 1 through stage 4 chronic kidney disease, or unspecified chronic kidney disease: Secondary | ICD-10-CM | POA: Diagnosis not present

## 2014-10-19 DIAGNOSIS — I5030 Unspecified diastolic (congestive) heart failure: Secondary | ICD-10-CM | POA: Diagnosis not present

## 2014-10-21 ENCOUNTER — Encounter: Payer: Self-pay | Admitting: Neurology

## 2014-10-21 DIAGNOSIS — R5600 Simple febrile convulsions: Secondary | ICD-10-CM | POA: Insufficient documentation

## 2014-10-22 ENCOUNTER — Other Ambulatory Visit (HOSPITAL_BASED_OUTPATIENT_CLINIC_OR_DEPARTMENT_OTHER): Payer: Medicare Other

## 2014-10-22 DIAGNOSIS — I5030 Unspecified diastolic (congestive) heart failure: Secondary | ICD-10-CM | POA: Diagnosis not present

## 2014-10-22 DIAGNOSIS — N183 Chronic kidney disease, stage 3 (moderate): Secondary | ICD-10-CM | POA: Diagnosis not present

## 2014-10-22 DIAGNOSIS — D500 Iron deficiency anemia secondary to blood loss (chronic): Secondary | ICD-10-CM

## 2014-10-22 DIAGNOSIS — Z794 Long term (current) use of insulin: Secondary | ICD-10-CM | POA: Diagnosis not present

## 2014-10-22 DIAGNOSIS — E1129 Type 2 diabetes mellitus with other diabetic kidney complication: Secondary | ICD-10-CM | POA: Diagnosis not present

## 2014-10-22 DIAGNOSIS — I780 Hereditary hemorrhagic telangiectasia: Secondary | ICD-10-CM

## 2014-10-22 DIAGNOSIS — D638 Anemia in other chronic diseases classified elsewhere: Secondary | ICD-10-CM | POA: Diagnosis not present

## 2014-10-22 DIAGNOSIS — I129 Hypertensive chronic kidney disease with stage 1 through stage 4 chronic kidney disease, or unspecified chronic kidney disease: Secondary | ICD-10-CM | POA: Diagnosis not present

## 2014-10-22 DIAGNOSIS — I82409 Acute embolism and thrombosis of unspecified deep veins of unspecified lower extremity: Secondary | ICD-10-CM | POA: Diagnosis not present

## 2014-10-22 DIAGNOSIS — K31819 Angiodysplasia of stomach and duodenum without bleeding: Secondary | ICD-10-CM

## 2014-10-22 DIAGNOSIS — G40909 Epilepsy, unspecified, not intractable, without status epilepticus: Secondary | ICD-10-CM | POA: Diagnosis not present

## 2014-10-22 LAB — CBC WITH DIFFERENTIAL/PLATELET
BASO%: 0.3 % (ref 0.0–2.0)
BASOS ABS: 0 10*3/uL (ref 0.0–0.1)
EOS ABS: 0.1 10*3/uL (ref 0.0–0.5)
EOS%: 1 % (ref 0.0–7.0)
HCT: 26.4 % — ABNORMAL LOW (ref 34.8–46.6)
HGB: 8.7 g/dL — ABNORMAL LOW (ref 11.6–15.9)
LYMPH%: 7.5 % — AB (ref 14.0–49.7)
MCH: 28.4 pg (ref 25.1–34.0)
MCHC: 33 g/dL (ref 31.5–36.0)
MCV: 86.3 fL (ref 79.5–101.0)
MONO#: 0.7 10*3/uL (ref 0.1–0.9)
MONO%: 6.9 % (ref 0.0–14.0)
NEUT#: 8.9 10*3/uL — ABNORMAL HIGH (ref 1.5–6.5)
NEUT%: 84.3 % — ABNORMAL HIGH (ref 38.4–76.8)
PLATELETS: 334 10*3/uL (ref 145–400)
RBC: 3.06 10*6/uL — AB (ref 3.70–5.45)
RDW: 16 % — ABNORMAL HIGH (ref 11.2–14.5)
WBC: 10.6 10*3/uL — ABNORMAL HIGH (ref 3.9–10.3)
lymph#: 0.8 10*3/uL — ABNORMAL LOW (ref 0.9–3.3)
nRBC: 0 % (ref 0–0)

## 2014-10-22 LAB — HOLD TUBE, BLOOD BANK

## 2014-10-24 ENCOUNTER — Encounter: Payer: Self-pay | Admitting: Cardiology

## 2014-10-24 ENCOUNTER — Telehealth: Payer: Self-pay | Admitting: *Deleted

## 2014-10-24 DIAGNOSIS — E1129 Type 2 diabetes mellitus with other diabetic kidney complication: Secondary | ICD-10-CM | POA: Diagnosis not present

## 2014-10-24 DIAGNOSIS — D638 Anemia in other chronic diseases classified elsewhere: Secondary | ICD-10-CM | POA: Diagnosis not present

## 2014-10-24 DIAGNOSIS — I129 Hypertensive chronic kidney disease with stage 1 through stage 4 chronic kidney disease, or unspecified chronic kidney disease: Secondary | ICD-10-CM | POA: Diagnosis not present

## 2014-10-24 DIAGNOSIS — Z794 Long term (current) use of insulin: Secondary | ICD-10-CM | POA: Diagnosis not present

## 2014-10-24 DIAGNOSIS — N183 Chronic kidney disease, stage 3 (moderate): Secondary | ICD-10-CM | POA: Diagnosis not present

## 2014-10-24 DIAGNOSIS — I82409 Acute embolism and thrombosis of unspecified deep veins of unspecified lower extremity: Secondary | ICD-10-CM | POA: Diagnosis not present

## 2014-10-24 DIAGNOSIS — I5030 Unspecified diastolic (congestive) heart failure: Secondary | ICD-10-CM | POA: Diagnosis not present

## 2014-10-24 DIAGNOSIS — I780 Hereditary hemorrhagic telangiectasia: Secondary | ICD-10-CM | POA: Diagnosis not present

## 2014-10-24 DIAGNOSIS — G40909 Epilepsy, unspecified, not intractable, without status epilepticus: Secondary | ICD-10-CM | POA: Diagnosis not present

## 2014-10-24 NOTE — Telephone Encounter (Signed)
-----   Message from [name], MD sent at 10/22/2014  3:32 PM EDT ----- Call pt: blood count 8.7; drifting down but does not need transfusion this week. Will likely need bllod in 2 weeks. Continue Q 2 week labs

## 2014-10-24 NOTE — Telephone Encounter (Signed)
Pt called / informed her blood count is drifting down @ 8.7;no blood transfusion needed at this time and to continue q 2 weeks labs per Dr [name]. Pt voiced understanding; has an appt next week.

## 2014-10-25 DIAGNOSIS — R269 Unspecified abnormalities of gait and mobility: Secondary | ICD-10-CM | POA: Diagnosis not present

## 2014-10-26 ENCOUNTER — Telehealth: Payer: Self-pay | Admitting: Neurology

## 2014-10-26 NOTE — Telephone Encounter (Signed)
Patients niece called stating she is sleeping all the time, getting up to go to the bathroom, not eating well or drinking. She is taking levETIRAcetam (KEPPRA) 500 MG tablet . She states patients husband decreased medication to 1/2 in am and 1/2 in pm about 2 days ago. He feels she is doing a Nicotra better. Niece was concerned about him decreasing medication without discussing with Dr.  Niece is not on DPR. Please call husband [name] @ [phone] to touch base.

## 2014-10-28 NOTE — Telephone Encounter (Signed)
[name] - patient needs to go to her primary care. I'm worried this is not a side effect of the Keppra but rather her ammonia level increasing again. She needs to go to her primary care ASAP for evaluation. And she needs to stay on the full dose of Keppra until she is evaluated for any other cause of her drowsiness. If no other cause is found we can switch anti-epileptics. But I am worried this is her ammonia level, she is on lactulose and was just admitted for hyperammonemia. Let me know what her husband says, and how patient is doing.  thanks

## 2014-10-29 DIAGNOSIS — G40909 Epilepsy, unspecified, not intractable, without status epilepticus: Secondary | ICD-10-CM | POA: Diagnosis not present

## 2014-10-29 DIAGNOSIS — E1129 Type 2 diabetes mellitus with other diabetic kidney complication: Secondary | ICD-10-CM | POA: Diagnosis not present

## 2014-10-29 DIAGNOSIS — I82409 Acute embolism and thrombosis of unspecified deep veins of unspecified lower extremity: Secondary | ICD-10-CM | POA: Diagnosis not present

## 2014-10-29 DIAGNOSIS — I780 Hereditary hemorrhagic telangiectasia: Secondary | ICD-10-CM | POA: Diagnosis not present

## 2014-10-29 DIAGNOSIS — I5030 Unspecified diastolic (congestive) heart failure: Secondary | ICD-10-CM | POA: Diagnosis not present

## 2014-10-29 DIAGNOSIS — D638 Anemia in other chronic diseases classified elsewhere: Secondary | ICD-10-CM | POA: Diagnosis not present

## 2014-10-29 DIAGNOSIS — Z794 Long term (current) use of insulin: Secondary | ICD-10-CM | POA: Diagnosis not present

## 2014-10-29 DIAGNOSIS — N183 Chronic kidney disease, stage 3 (moderate): Secondary | ICD-10-CM | POA: Diagnosis not present

## 2014-10-29 DIAGNOSIS — I129 Hypertensive chronic kidney disease with stage 1 through stage 4 chronic kidney disease, or unspecified chronic kidney disease: Secondary | ICD-10-CM | POA: Diagnosis not present

## 2014-10-29 NOTE — Telephone Encounter (Signed)
Called and spoke w/ husband, [name] who is listed on DPR. Advised that Dr. Jaynee Eagles is worried her symptoms are not a side effect of the Keppra, but possibly her ammonia level increasing again. She needs to go to her primary care physician ASAP for evaluation. She needs to stay on Keppra until she is evaluated for other causes of drowsiness. If no other cause is found, Dr. Jaynee Eagles can switch anti-epileptic medication. Dr. Jaynee Eagles is worried this is her ammonia level. Husband verbalized understanding and is going to call PCP to set up appointment.

## 2014-10-30 ENCOUNTER — Telehealth: Payer: Self-pay | Admitting: *Deleted

## 2014-10-30 ENCOUNTER — Encounter: Payer: Self-pay | Admitting: Oncology

## 2014-10-30 ENCOUNTER — Other Ambulatory Visit: Payer: Self-pay | Admitting: Oncology

## 2014-10-30 ENCOUNTER — Ambulatory Visit (INDEPENDENT_AMBULATORY_CARE_PROVIDER_SITE_OTHER): Payer: Medicare Other | Admitting: Oncology

## 2014-10-30 VITALS — BP 115/42 | HR 48 | Temp 98.4°F | Wt 137.6 lb

## 2014-10-30 DIAGNOSIS — R569 Unspecified convulsions: Secondary | ICD-10-CM | POA: Diagnosis not present

## 2014-10-30 DIAGNOSIS — E119 Type 2 diabetes mellitus without complications: Secondary | ICD-10-CM

## 2014-10-30 DIAGNOSIS — L438 Other lichen planus: Secondary | ICD-10-CM

## 2014-10-30 DIAGNOSIS — Z794 Long term (current) use of insulin: Secondary | ICD-10-CM

## 2014-10-30 DIAGNOSIS — I780 Hereditary hemorrhagic telangiectasia: Secondary | ICD-10-CM | POA: Diagnosis not present

## 2014-10-30 DIAGNOSIS — K31819 Angiodysplasia of stomach and duodenum without bleeding: Secondary | ICD-10-CM

## 2014-10-30 DIAGNOSIS — I10 Essential (primary) hypertension: Secondary | ICD-10-CM | POA: Diagnosis not present

## 2014-10-30 DIAGNOSIS — E1165 Type 2 diabetes mellitus with hyperglycemia: Secondary | ICD-10-CM | POA: Diagnosis not present

## 2014-10-30 DIAGNOSIS — D509 Iron deficiency anemia, unspecified: Secondary | ICD-10-CM

## 2014-10-30 DIAGNOSIS — R0989 Other specified symptoms and signs involving the circulatory and respiratory systems: Secondary | ICD-10-CM

## 2014-10-30 DIAGNOSIS — I5032 Chronic diastolic (congestive) heart failure: Secondary | ICD-10-CM | POA: Diagnosis not present

## 2014-10-30 DIAGNOSIS — K922 Gastrointestinal hemorrhage, unspecified: Secondary | ICD-10-CM

## 2014-10-30 DIAGNOSIS — I350 Nonrheumatic aortic (valve) stenosis: Secondary | ICD-10-CM

## 2014-10-30 LAB — CBC
HCT: 24.4 % — ABNORMAL LOW (ref 36.0–46.0)
Hemoglobin: 7.8 g/dL — ABNORMAL LOW (ref 12.0–15.0)
MCH: 26.5 pg (ref 26.0–34.0)
MCHC: 32 g/dL (ref 30.0–36.0)
MCV: 83 fL (ref 78.0–100.0)
PLATELETS: 270 10*3/uL (ref 150–400)
RBC: 2.94 MIL/uL — AB (ref 3.87–5.11)
RDW: 16 % — ABNORMAL HIGH (ref 11.5–15.5)
WBC: 5.7 10*3/uL (ref 4.0–10.5)

## 2014-10-30 NOTE — Telephone Encounter (Signed)
Per Dr. [name], I scheduled a blood transfusion at [facility] for Wed. 10/31/14 at 10:00 am. Patient is to receive two units of PRBC's. This RN spoke with patient and then her husband. Directions, address and time and date of appointment given to husband. Husband verified understanding of directions, address and appointment. [facility] can view the orders placed by Dr. [name] in [facility]. Husband and patient verbalized understanding of this conversation.

## 2014-10-30 NOTE — Progress Notes (Signed)
Patient ID: [name], female   DOB: Sep 01, 1940, 74 y.o.   MRN: [id number] Hematology and Oncology Follow Up Visit  [name] [id number] 01-15-1941 74 y.o. 10/30/2014 1:58 PM   Principle Diagnosis: Encounter Diagnoses  Name Primary?  . Gastric AVM Yes  . Osler-Weber-Rendu disease   . 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 She also has intermittent epistaxis. She requires frequent transfusions and parenteral iron supplementation. She does not tolerate oral iron. Transfusion requirements are becoming more frequent.  She required hospital admission in August 2015 for urgent blood transfusion. She was evaluated by gastroenterology and underwent upper endoscopy on August 26 by Dr. Laurence Spates. A number of her gastric AVMs were actively bleeding. Some were cauterized. The largest was clipped. Despite laser fulguration and clipping of gastric AVMs, she continues to lose blood on a continuous basis.   Interim History:   She continues to be transfusion dependent. I alternated parenteral iron with blood to try to keep her stable. It is an ongoing challenge. She had an episode of hematemesis and was admitted on May 26. A number of AVMs and previous scars from laser procedures were seen but no active bleeding at time of upper endoscopy on May 27. She had a another procedure on July 27. A large AVM along the greater curvature of the stomach was bleeding and required laser therapy. Since her last visit with me she was evaluated by neurology for "seizures". Is not really clear from reading the notes whether she has had seizures or she just gets nonspecific muscle jerking when she is at sleep at night which frightens her husband. She had a complete evaluation. It was decided to put her on Keppra 500 mg twice a  day. She has chronic diastolic heart failure. She is on multiple medications. It appears that Vasotec was recently added at 20 mg daily. Her blood pressure is running low today at 115/42. Pulse 48. She denies seeing any gross blood in her stools. Stools are intermittently black from oral iron.  Medications: reviewed  Allergies:  Allergies  Allergen Reactions  . Aspirin Nausea And Vomiting    Review of Systems: See history of present illness Remaining ROS negative:   Physical Exam: Blood pressure 115/42, pulse 48, temperature 98.4 F (36.9 C), temperature source Oral, weight 137 lb 9.6 oz (62.415 kg), SpO2 100 %. Wt Readings from Last 3 Encounters:  10/30/14 137 lb 9.6 oz (62.415 kg)  10/18/14 135 lb (61.236 kg)  10/03/14 137 lb (62.143 kg)     General appearance: Well-nourished African-American woman wearing a wig HENNT: Bilateral intraocular lens implants. Pharynx no erythema, exudate, mass, or ulcer. No thyromegaly or thyroid nodules Lymph nodes: No cervical, supraclavicular, or axillary lymphadenopathy Breasts:  Lungs: Clear to auscultation, resonant to percussion throughout Heart: Regular rhythm, 3/6 systolic murmur, heard all over the precordium left sternal border and second right intercostal space. no gallop, no rub, no click, no edema Abdomen: Soft, nontender, normal bowel sounds, no mass, no organomegaly Extremities: No edema, no calf tenderness Musculoskeletal: no joint deformities GU:  Vascular: Carotid pulses 2+, left carotid bruit,  Neurologic: Alert, oriented, PERRLA, optic discs sharp and vessels normal, no hemorrhage or exudate, cranial nerves grossly normal, motor strength 5 over 5, reflexes 1+ symmetric, upper body coordination normal, gait normal, Skin: No rash or ecchymosis  Lab  Results: CBC W/Diff    Component Value Date/Time   WBC 5.7 10/30/2014 1140   WBC 10.6* 10/22/2014 1427   RBC 2.94* 10/30/2014 1140   RBC 3.06* 10/22/2014 1427   RBC 3.19*  02/01/2011 0839   HGB 7.8* 10/30/2014 1140   HGB 8.7* 10/22/2014 1427   HCT 24.4* 10/30/2014 1140   HCT 26.4* 10/22/2014 1427   PLT 270 10/30/2014 1140   PLT 334 10/22/2014 1427   MCV 83.0 10/30/2014 1140   MCV 86.3 10/22/2014 1427   MCH 26.5 10/30/2014 1140   MCH 28.4 10/22/2014 1427   MCHC 32.0 10/30/2014 1140   MCHC 33.0 10/22/2014 1427   RDW 16.0* 10/30/2014 1140   RDW 16.0* 10/22/2014 1427   LYMPHSABS 0.8* 10/22/2014 1427   LYMPHSABS 0.7 10/02/2014 2348   MONOABS 0.7 10/22/2014 1427   MONOABS 0.4 10/02/2014 2348   EOSABS 0.1 10/22/2014 1427   EOSABS 0.1 10/02/2014 2348   BASOSABS 0.0 10/22/2014 1427   BASOSABS 0.0 10/02/2014 2348     Chemistry      Component Value Date/Time   NA 138 10/03/2014 0321   NA 144 04/04/2014 1019   K 3.8 10/03/2014 0321   K 4.5 04/04/2014 1019   CL 102 10/03/2014 0321   CL 111* 12/28/2011 0825   CO2 26 10/03/2014 0321   CO2 26 04/04/2014 1019   BUN 31* 10/03/2014 0321   BUN 16.9 04/04/2014 1019   CREATININE 1.26* 10/03/2014 0321   CREATININE 0.9 04/04/2014 1019   CREATININE 0.76 06/06/2013 0947      Component Value Date/Time   CALCIUM 9.5 10/03/2014 0321   CALCIUM 9.3 04/04/2014 1019   ALKPHOS 116 10/03/2014 0321   ALKPHOS 140 04/04/2014 1019   AST 25 10/03/2014 0321   AST 30 04/04/2014 1019   ALT 23 10/03/2014 0321   ALT 25 04/04/2014 1019   BILITOT 0.7 10/03/2014 0321   BILITOT 0.43 04/04/2014 1019       Radiological Studies: Ct Head Wo Contrast  10/03/2014   CLINICAL DATA:  Altered mental status.  Lethargy.  EXAM: CT HEAD WITHOUT CONTRAST  TECHNIQUE: Contiguous axial images were obtained from the base of the skull through the vertex without intravenous contrast.  COMPARISON:  Head CT 08/27/2014, brain MRI 08/28/2014  FINDINGS: No intracranial hemorrhage, mass effect, or midline shift. No hydrocephalus. The basilar cisterns are patent. No evidence of territorial infarct. No intracranial fluid collection. Generalized atrophy  and chronic small vessel ischemic change, stable from prior exam. Calvarium is intact. Included paranasal sinuses and mastoid air cells are well aerated. Previous opacification of the right maxillary sinus is no longer seen.  IMPRESSION: Atrophy and chronic small vessel ischemic change. No acute intracranial abnormality.   Electronically Signed   By: Jeb Levering M.D.   On: 10/03/2014 00:25   US Renal  10/03/2014   CLINICAL DATA:  Acute kidney injury.  EXAM: RENAL / URINARY TRACT ULTRASOUND COMPLETE  COMPARISON:  None.  FINDINGS: Right Kidney:  Length: 8.4 cm. Echogenicity within normal limits. No mass or hydronephrosis visualized.  Left Kidney:  Length: 9.0 cm. Echogenicity within normal limits. No mass or hydronephrosis visualized.  Bladder:  Decompressed and not evaluated.  IMPRESSION: Normal sonographic appearance of the kidneys. No obstructive uropathy. Urinary bladder decompressed and not evaluated.   Electronically Signed   By: Jeb Levering M.D.   On: 10/03/2014 02:23   Dg Chest Portable 1 View  10/02/2014   CLINICAL DATA:  Altered mental status. Tired. Difficult to  wake. History of diabetes and hypertension.  EXAM: PORTABLE CHEST - 1 VIEW  COMPARISON:  08/27/2014  FINDINGS: Shallow inspiration. Power port type central venous catheter with tip over the low SVC. No pneumothorax. Cardiac enlargement with mild pulmonary vascular congestion. No edema or consolidation. No blunting of costophrenic angles. No pneumothorax. Calcified and tortuous aorta.  IMPRESSION: Cardiac enlargement with mild pulmonary vascular congestion. No edema or consolidation.   Electronically Signed   By: Lucienne Capers M.D.   On: 10/02/2014 23:20    Impression:  #1. HHT  #2. Chronic GI blood loss secondary to #1   #3. Extensive lichen planus rash lower extremities #4. Aortic stenosis #5. Essential hypertension. #6. Type 2 diabetes now insulin-dependent using Levemir #7. Faint carotid bruit left side. #8.  Question seizure disorder recently started on Keppra  CC: Patient Care Team: Nolene Ebbs, MD as PCP - General (Internal Medicine)   [name], MD 9/20/20161:58 PM

## 2014-10-30 NOTE — Patient Instructions (Signed)
To lab here today Continue every 2 week labs at [facility] MD visit with Dr Darnell Level in 4 months lab day of visit at medicine clinic

## 2014-10-31 ENCOUNTER — Ambulatory Visit (HOSPITAL_COMMUNITY)
Admission: RE | Admit: 2014-10-31 | Discharge: 2014-10-31 | Disposition: A | Discharge status: Home / Self Care | Payer: Medicare Other | Source: Ambulatory Visit | Attending: Oncology | Admitting: Oncology

## 2014-10-31 ENCOUNTER — Emergency Department (HOSPITAL_COMMUNITY)
Admission: EM | Admit: 2014-10-31 | Discharge: 2014-10-31 | Disposition: A | Discharge status: Home / Self Care | Payer: Medicare Other | Attending: Emergency Medicine | Admitting: Emergency Medicine

## 2014-10-31 ENCOUNTER — Encounter (HOSPITAL_COMMUNITY): Payer: Self-pay

## 2014-10-31 VITALS — BP 103/37 | HR 70 | Temp 98.0°F | Resp 16

## 2014-10-31 DIAGNOSIS — R011 Cardiac murmur, unspecified: Secondary | ICD-10-CM | POA: Insufficient documentation

## 2014-10-31 DIAGNOSIS — Z872 Personal history of diseases of the skin and subcutaneous tissue: Secondary | ICD-10-CM | POA: Diagnosis not present

## 2014-10-31 DIAGNOSIS — Z87891 Personal history of nicotine dependence: Secondary | ICD-10-CM | POA: Diagnosis not present

## 2014-10-31 DIAGNOSIS — I5032 Chronic diastolic (congestive) heart failure: Secondary | ICD-10-CM | POA: Diagnosis not present

## 2014-10-31 DIAGNOSIS — G40909 Epilepsy, unspecified, not intractable, without status epilepticus: Secondary | ICD-10-CM | POA: Insufficient documentation

## 2014-10-31 DIAGNOSIS — E119 Type 2 diabetes mellitus without complications: Secondary | ICD-10-CM | POA: Diagnosis not present

## 2014-10-31 DIAGNOSIS — Z79899 Other long term (current) drug therapy: Secondary | ICD-10-CM | POA: Diagnosis not present

## 2014-10-31 DIAGNOSIS — K219 Gastro-esophageal reflux disease without esophagitis: Secondary | ICD-10-CM | POA: Diagnosis not present

## 2014-10-31 DIAGNOSIS — Z8701 Personal history of pneumonia (recurrent): Secondary | ICD-10-CM | POA: Diagnosis not present

## 2014-10-31 DIAGNOSIS — Z794 Long term (current) use of insulin: Secondary | ICD-10-CM | POA: Insufficient documentation

## 2014-10-31 DIAGNOSIS — R4182 Altered mental status, unspecified: Secondary | ICD-10-CM | POA: Diagnosis present

## 2014-10-31 DIAGNOSIS — I780 Hereditary hemorrhagic telangiectasia: Secondary | ICD-10-CM | POA: Diagnosis not present

## 2014-10-31 DIAGNOSIS — E7220 Disorder of urea cycle metabolism, unspecified: Secondary | ICD-10-CM | POA: Diagnosis not present

## 2014-10-31 DIAGNOSIS — D649 Anemia, unspecified: Secondary | ICD-10-CM | POA: Diagnosis not present

## 2014-10-31 LAB — BASIC METABOLIC PANEL
Anion gap: 7 (ref 5–15)
BUN: 25 mg/dL — ABNORMAL HIGH (ref 6–20)
CALCIUM: 8.8 mg/dL — AB (ref 8.9–10.3)
CO2: 27 mmol/L (ref 22–32)
CREATININE: 1.12 mg/dL — AB (ref 0.44–1.00)
Chloride: 104 mmol/L (ref 101–111)
GFR calc non Af Amer: 47 mL/min — ABNORMAL LOW (ref >60)
GFR, EST AFRICAN AMERICAN: 55 mL/min — AB (ref >60)
Glucose, Bld: 366 mg/dL — ABNORMAL HIGH (ref 65–99)
Potassium: 3.6 mmol/L (ref 3.5–5.1)
SODIUM: 138 mmol/L (ref 135–145)

## 2014-10-31 LAB — HEPATIC FUNCTION PANEL
ALT: 22 U/L (ref 14–54)
AST: 25 U/L (ref 15–41)
Albumin: 3 g/dL — ABNORMAL LOW (ref 3.5–5.0)
Alkaline Phosphatase: 104 U/L (ref 38–126)
BILIRUBIN INDIRECT: 1.3 mg/dL — AB (ref 0.3–0.9)
Bilirubin, Direct: 0.2 mg/dL (ref 0.1–0.5)
TOTAL PROTEIN: 6.1 g/dL — AB (ref 6.5–8.1)
Total Bilirubin: 1.5 mg/dL — ABNORMAL HIGH (ref 0.3–1.2)

## 2014-10-31 LAB — CBC
HCT: 31.1 % — ABNORMAL LOW (ref 36.0–46.0)
Hemoglobin: 10.3 g/dL — ABNORMAL LOW (ref 12.0–15.0)
MCH: 28.2 pg (ref 26.0–34.0)
MCHC: 33.1 g/dL (ref 30.0–36.0)
MCV: 85.2 fL (ref 78.0–100.0)
PLATELETS: 224 10*3/uL (ref 150–400)
RBC: 3.65 MIL/uL — ABNORMAL LOW (ref 3.87–5.11)
RDW: 15.3 % (ref 11.5–15.5)
WBC: 5.3 10*3/uL (ref 4.0–10.5)

## 2014-10-31 LAB — AMMONIA: AMMONIA: 72 umol/L — AB (ref 9–35)

## 2014-10-31 LAB — PREPARE RBC (CROSSMATCH)

## 2014-10-31 MED ORDER — FUROSEMIDE 10 MG/ML IJ SOLN
20.0000 mg | Freq: Once | INTRAMUSCULAR | Status: AC
  Administered 2014-10-31: 20 mg via INTRAVENOUS
  Filled 2014-10-31: qty 2

## 2014-10-31 MED ORDER — HEPARIN SOD (PORK) LOCK FLUSH 100 UNIT/ML IV SOLN
500.0000 [IU] | Freq: Once | INTRAVENOUS | Status: AC
  Administered 2014-10-31: 500 [IU]
  Filled 2014-10-31: qty 5

## 2014-10-31 MED ORDER — HEPARIN SOD (PORK) LOCK FLUSH 100 UNIT/ML IV SOLN: 250.0000 [IU] | INTRAVENOUS | Status: DC | PRN

## 2014-10-31 MED ORDER — SODIUM CHLORIDE 0.9 % IJ SOLN: 3.0000 mL | INTRAMUSCULAR | Status: DC | PRN

## 2014-10-31 MED ORDER — HEPARIN SOD (PORK) LOCK FLUSH 100 UNIT/ML IV SOLN: 500.0000 [IU] | Freq: Every day | INTRAVENOUS | Status: DC | PRN

## 2014-10-31 MED ORDER — LACTULOSE 10 GM/15ML PO SOLN: 10.0000 g | Freq: Two times a day (BID) | ORAL | Status: DC

## 2014-10-31 MED ORDER — SODIUM CHLORIDE 0.9 % IJ SOLN
10.0000 mL | INTRAMUSCULAR | Status: AC | PRN
  Administered 2014-10-31: 10 mL

## 2014-10-31 MED ORDER — LACTULOSE 10 GM/15ML PO SOLN
30.0000 g | Freq: Once | ORAL | Status: AC
Start: 2014-10-31 — End: 2014-10-31
  Administered 2014-10-31: 30 g via ORAL
  Filled 2014-10-31 (×2): qty 45

## 2014-10-31 MED ORDER — SODIUM CHLORIDE 0.9 % IV SOLN: Freq: Once | INTRAVENOUS | Status: DC

## 2014-10-31 MED ORDER — SODIUM CHLORIDE 0.9 % IV SOLN
250.0000 mL | Freq: Once | INTRAVENOUS | Status: AC
  Administered 2014-10-31: 250 mL via INTRAVENOUS

## 2014-10-31 NOTE — ED Notes (Signed)
Per Judson Roch RN- Pt seen at [facility] for Blood Transfusion. Today while receiving scheduled transfusion pt presented with lethargy. Pt received call from PCP [place]- elevated Ammonia 145. Drawn yesterday. Transfusion completed and pt was to be brought to ED for evaluation. Pt VSS and in NAD

## 2014-10-31 NOTE — ED Notes (Signed)
MD at bedside. 

## 2014-10-31 NOTE — Progress Notes (Signed)
[name]   MRN: [id number]  DOB: 09/15/1940  DOA: 10/31/2014  PCP: Dr. 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  Condition at Discharge:  Pt stable at discharge.  Pt taken via wheelchair to ED per Dr. Shanon Payor office due to pt's elevated ammonia level.  PAC left accessed per ED charge nurse, Ivar Drape, RN.  Husband and pt aware of plan.

## 2014-10-31 NOTE — ED Notes (Signed)
Pt being sent by PCP due to Ammonia 145.  Pt receiving [facility]  Pt is alert, but drowsy and O&4.

## 2014-10-31 NOTE — Progress Notes (Signed)
[facility] transfusion per Dr. [name].  During first unit of blood Dr. Santiago Bur office called pt's cell phone to inform her of elevated ammonia level of 145.  MD office informed pt that she needed to go to ED after receiving her 2 units of PRBCs.  Office faxed lab results to [facility].    [name], pt's husband, called to make aware of plan/lab results.    Charge RN in ED called to make aware of pt coming.  Pt has been stable during blood transfusion, alert and oriented, very drowsy.  Able to ambulate to BR with assistance of cane and RN.

## 2014-10-31 NOTE — ED Provider Notes (Signed)
CSN: [id number]     Arrival date & time 10/31/14  1636 History   First MD Initiated Contact with Patient 10/31/14 1652     Chief Complaint  Patient presents with  . Altered Mental Status     (Consider location/radiation/quality/duration/timing/severity/associated sxs/prior Treatment) HPI Comments: Patient was getting a blood transfusion for chronic anemia and   Patient is a 74 y.o. female presenting with altered mental status. The history is provided by the patient.  Altered Mental Status Presenting symptoms: lethargy   Severity:  Mild Most recent episode:  Today Episode history:  Single Timing:  Constant Progression:  Improving Chronicity:  New Context: not a recent change in medication   Associated symptoms: no abdominal pain, no fever and no vomiting     Past Medical History  Diagnosis Date  . Anemia   . S/P endoscopy     with laser treatment  08/19/2006  . History of epistaxis   . Hyperlipidemia   . Telangiectasia     Gastric   . Lichen planus     Both lower extremities  . GERD (gastroesophageal reflux disease)   . Heart murmur   . CHF (congestive heart failure)   . Type II diabetes mellitus     insulin requiring.  Marland Kitchen History of blood transfusion "several"  . Chronic diastolic CHF (congestive heart failure) 10/03/2013  . HTN (hypertension), benign 03/02/2012  . Family history of anesthesia complication     "niece has a hard time coming out" (09/15/2012)  . Pneumonia 1990's X 2  . Seizures     "they think she might be having seizures; they are checking her for that now" (10/02/2014)   Past Surgical History  Procedure Laterality Date  . Nose surgery      "for bleeding"   . Savory dilation  02/26/2011    Procedure: SAVORY DILATION;  Surgeon: [name], MD;  Location: [name];  Service: Endoscopy;  Laterality: N/A;  c-arm needed  . Esophagogastroduodenoscopy  02/26/2011    Procedure: ESOPHAGOGASTRODUODENOSCOPY (EGD);  Surgeon: [name], MD;  Location: [name];  Service: Endoscopy;  Laterality: N/A;  . Esophagogastroduodenoscopy N/A 11/08/2012    Procedure: ESOPHAGOGASTRODUODENOSCOPY (EGD);  Surgeon: [name], MD;  Location: [name];  Service: Endoscopy;  Laterality: N/A;  . Cataract extraction      "I think it was just one eye"  . Esophagogastroduodenoscopy N/A 10/04/2013    Procedure: ESOPHAGOGASTRODUODENOSCOPY (EGD);  Surgeon: Winfield Cunas., MD;  Location: [name];  Service: Endoscopy;  Laterality: N/A;  with APC on stand-by  . Esophagogastroduodenoscopy N/A 07/06/2014    Procedure: ESOPHAGOGASTRODUODENOSCOPY (EGD);  Surgeon: Clarene Essex, MD;  Location: [name];  Service: Endoscopy;  Laterality: N/A;  . Hot hemostasis N/A 07/06/2014    Procedure: HOT HEMOSTASIS (ARGON PLASMA COAGULATION/BICAP);  Surgeon: Clarene Essex, MD;  Location: [name];  Service: Endoscopy;  Laterality: N/A;  . Esophagogastroduodenoscopy N/A 09/05/2014    Procedure: ESOPHAGOGASTRODUODENOSCOPY (EGD);  Surgeon: Laurence Spates, MD;  Location: [name];  Service: Endoscopy;  Laterality: N/A;  APC on standby to control bleeding   Family History  Problem Relation Age of Onset  . Breast cancer    . Malignant hyperthermia Neg Hx   . Seizures Neg Hx   . Stroke Father    Social History  Substance Use Topics  . Smoking status: Former Smoker -- 2 years    Types: Cigarettes    Quit date: 02/10/1971  . Smokeless tobacco: Never Used  Comment: 09/15/2012 "smoked 50-60 yr ago"  . Alcohol Use: No   OB History    No data available     Review of Systems  Constitutional: Negative for fever and chills.  Respiratory: Negative for cough and shortness of breath.   Cardiovascular: Negative for chest pain and leg swelling.  Gastrointestinal: Negative for vomiting and abdominal pain.  All other systems reviewed and are negative.     Allergies  Aspirin  Home Medications   Prior to Admission medications   Medication Sig Start Date End Date  Taking? Authorizing Provider  AMITIZA 24 MCG capsule Take 24 mcg by mouth 2 (two) times daily. 08/29/14  Yes Historical Provider, MD  amitriptyline (ELAVIL) 25 MG tablet Take 25 mg by mouth daily. 10/12/14  Yes Historical Provider, MD  amLODipine (NORVASC) 5 MG tablet Take 5 mg by mouth daily.   Yes Historical Provider, MD  cyclobenzaprine (FLEXERIL) 5 MG tablet Take 1 tablet (5 mg total) by mouth 3 (three) times daily as needed for muscle spasms. 08/28/14  Yes Robbie Lis, MD  enalapril (VASOTEC) 20 MG tablet Take 20 mg by mouth daily. 09/16/14  Yes Historical Provider, MD  ferrous fumarate (HEMOCYTE - 106 MG FE) 325 (106 FE) MG TABS tablet Take 1 tablet by mouth daily.   Yes Historical Provider, MD  furosemide (LASIX) 40 MG tablet Take 40mg  twice daily for two days and then start taking 40mg  once daily. Patient taking differently: Take 40 mg by mouth daily.  07/30/14  Yes [name], MD  insulin detemir (LEVEMIR) 100 UNIT/ML injection Inject 0.3 mLs (30 Units total) into the skin at bedtime. Patient taking differently: Inject 30 Units into the skin 2 (two) times daily.  10/05/13  Yes Robbie Lis, MD  lactulose (CHRONULAC) 10 GM/15ML solution Take 15 mLs (10 g total) by mouth 2 (two) times daily. 10/05/14  Yes Robbie Lis, MD  levETIRAcetam (KEPPRA) 500 MG tablet Take 1 tablet (500 mg total) by mouth 2 (two) times daily. 10/18/14  Yes Melvenia Beam, MD  metoprolol succinate (TOPROL XL) 25 MG 24 hr tablet Take 0.5 tablets (12.5 mg total) by mouth daily. 09/07/14  Yes Debbe Odea, MD  oxymetazoline (AFRIN) 0.05 % nasal spray Place 1 spray into both nostrils 2 (two) times daily as needed (Epistaxis). 10/05/13  Yes Robbie Lis, MD  pantoprazole (PROTONIX) 40 MG tablet Take 1 tablet (40 mg total) by mouth 2 (two) times daily. 07/07/14  Yes Hosie Poisson, MD   BP 117/45 mmHg  Pulse 70  Temp(Src) 98.7 F (37.1 C) (Oral)  Resp 18  SpO2 100% Physical Exam  Constitutional: She is oriented to person,  place, and time. She appears well-developed and well-nourished. No distress.  HENT:  Head: Normocephalic and atraumatic.  Mouth/Throat: Oropharynx is clear and moist.  Eyes: EOM are normal. Pupils are equal, round, and reactive to light.  Neck: Normal range of motion. Neck supple.  Cardiovascular: Normal rate and regular rhythm.  Exam reveals no friction rub.   No murmur heard. Pulmonary/Chest: Effort normal and breath sounds normal. No respiratory distress. She has no wheezes. She has no rales.  Abdominal: Soft. She exhibits no distension. There is no tenderness. There is no rebound.  Musculoskeletal: Normal range of motion. She exhibits no edema.  Neurological: She is alert and oriented to person, place, and time. No cranial nerve deficit. She exhibits normal muscle tone. Coordination normal.  Skin: No rash noted. She is not diaphoretic.  Nursing note and vitals reviewed.   ED Course  Procedures (including critical care time) Labs Review Labs Reviewed  CBC  BASIC METABOLIC PANEL  AMMONIA    Imaging Review No results found. I have personally reviewed and evaluated these images and lab results as part of my medical decision-making.   EKG Interpretation None      MDM   Final diagnoses:  Hyperammonemia    75 year old female sent over from sickle cell clinic with lethargy. Noted to have high ammonia. She is on lactulose for this and she states she feels well and that there is nothing wrong. She recently had a pneumonia as high as 327 with normal mental status. This was one month ago. No fever, vomiting, diarrhea. Here she is well appearing and relaxing comfortably. She would like to go home. We'll check ammonia here as I cannot see the results from 2 days ago. She stated she had missed a few doses of her lactulose. She is chronically hyperammonemic.  Ammonia here 72. It was reported to be 140 by her PCP with rule out yesterday. She is well appearing, lactulose given here. Given  prescription for same. She stable for discharge.    Evelina Bucy, MD 10/31/14 [phone]

## 2014-11-01 DIAGNOSIS — G40909 Epilepsy, unspecified, not intractable, without status epilepticus: Secondary | ICD-10-CM | POA: Diagnosis not present

## 2014-11-01 DIAGNOSIS — I82409 Acute embolism and thrombosis of unspecified deep veins of unspecified lower extremity: Secondary | ICD-10-CM | POA: Diagnosis not present

## 2014-11-01 DIAGNOSIS — I5030 Unspecified diastolic (congestive) heart failure: Secondary | ICD-10-CM | POA: Diagnosis not present

## 2014-11-01 DIAGNOSIS — E1129 Type 2 diabetes mellitus with other diabetic kidney complication: Secondary | ICD-10-CM | POA: Diagnosis not present

## 2014-11-01 DIAGNOSIS — I780 Hereditary hemorrhagic telangiectasia: Secondary | ICD-10-CM | POA: Diagnosis not present

## 2014-11-01 DIAGNOSIS — I129 Hypertensive chronic kidney disease with stage 1 through stage 4 chronic kidney disease, or unspecified chronic kidney disease: Secondary | ICD-10-CM | POA: Diagnosis not present

## 2014-11-01 DIAGNOSIS — Z794 Long term (current) use of insulin: Secondary | ICD-10-CM | POA: Diagnosis not present

## 2014-11-01 DIAGNOSIS — N183 Chronic kidney disease, stage 3 (moderate): Secondary | ICD-10-CM | POA: Diagnosis not present

## 2014-11-01 DIAGNOSIS — D638 Anemia in other chronic diseases classified elsewhere: Secondary | ICD-10-CM | POA: Diagnosis not present

## 2014-11-01 LAB — TYPE AND SCREEN
ABO/RH(D): A POS
Antibody Screen: NEGATIVE
UNIT DIVISION: 0
UNIT DIVISION: 0

## 2014-11-05 ENCOUNTER — Other Ambulatory Visit (HOSPITAL_BASED_OUTPATIENT_CLINIC_OR_DEPARTMENT_OTHER): Payer: Medicare Other

## 2014-11-05 DIAGNOSIS — I780 Hereditary hemorrhagic telangiectasia: Secondary | ICD-10-CM

## 2014-11-05 DIAGNOSIS — D500 Iron deficiency anemia secondary to blood loss (chronic): Secondary | ICD-10-CM | POA: Diagnosis not present

## 2014-11-05 DIAGNOSIS — I10 Essential (primary) hypertension: Secondary | ICD-10-CM | POA: Diagnosis not present

## 2014-11-05 DIAGNOSIS — E1165 Type 2 diabetes mellitus with hyperglycemia: Secondary | ICD-10-CM | POA: Diagnosis not present

## 2014-11-05 DIAGNOSIS — I5032 Chronic diastolic (congestive) heart failure: Secondary | ICD-10-CM | POA: Diagnosis not present

## 2014-11-05 DIAGNOSIS — K31819 Angiodysplasia of stomach and duodenum without bleeding: Secondary | ICD-10-CM

## 2014-11-05 DIAGNOSIS — R569 Unspecified convulsions: Secondary | ICD-10-CM | POA: Diagnosis not present

## 2014-11-05 LAB — CBC WITH DIFFERENTIAL/PLATELET
BASO%: 0.5 % (ref 0.0–2.0)
Basophils Absolute: 0 10*3/uL (ref 0.0–0.1)
EOS ABS: 0.2 10*3/uL (ref 0.0–0.5)
EOS%: 3.9 % (ref 0.0–7.0)
HCT: 24.8 % — ABNORMAL LOW (ref 34.8–46.6)
HGB: 8.2 g/dL — ABNORMAL LOW (ref 11.6–15.9)
LYMPH%: 9.5 % — AB (ref 14.0–49.7)
MCH: 28.2 pg (ref 25.1–34.0)
MCHC: 32.9 g/dL (ref 31.5–36.0)
MCV: 85.7 fL (ref 79.5–101.0)
MONO#: 0.4 10*3/uL (ref 0.1–0.9)
MONO%: 5.9 % (ref 0.0–14.0)
NEUT%: 80.2 % — ABNORMAL HIGH (ref 38.4–76.8)
NEUTROS ABS: 4.8 10*3/uL (ref 1.5–6.5)
Platelets: 191 10*3/uL (ref 145–400)
RBC: 2.9 10*6/uL — AB (ref 3.70–5.45)
RDW: 18.2 % — ABNORMAL HIGH (ref 11.2–14.5)
WBC: 6 10*3/uL (ref 3.9–10.3)
lymph#: 0.6 10*3/uL — ABNORMAL LOW (ref 0.9–3.3)

## 2014-11-05 LAB — HOLD TUBE, BLOOD BANK

## 2014-11-06 ENCOUNTER — Ambulatory Visit: Payer: Medicare Other | Admitting: Internal Medicine

## 2014-11-06 LAB — FERRITIN CHCC: FERRITIN: 31 ng/mL (ref 9–269)

## 2014-11-08 ENCOUNTER — Other Ambulatory Visit: Payer: Self-pay | Admitting: Oncology

## 2014-11-08 DIAGNOSIS — E1129 Type 2 diabetes mellitus with other diabetic kidney complication: Secondary | ICD-10-CM | POA: Diagnosis not present

## 2014-11-08 DIAGNOSIS — I82409 Acute embolism and thrombosis of unspecified deep veins of unspecified lower extremity: Secondary | ICD-10-CM | POA: Diagnosis not present

## 2014-11-08 DIAGNOSIS — I780 Hereditary hemorrhagic telangiectasia: Secondary | ICD-10-CM | POA: Diagnosis not present

## 2014-11-08 DIAGNOSIS — N183 Chronic kidney disease, stage 3 (moderate): Secondary | ICD-10-CM | POA: Diagnosis not present

## 2014-11-08 DIAGNOSIS — I5030 Unspecified diastolic (congestive) heart failure: Secondary | ICD-10-CM | POA: Diagnosis not present

## 2014-11-08 DIAGNOSIS — G40909 Epilepsy, unspecified, not intractable, without status epilepticus: Secondary | ICD-10-CM | POA: Diagnosis not present

## 2014-11-08 DIAGNOSIS — I129 Hypertensive chronic kidney disease with stage 1 through stage 4 chronic kidney disease, or unspecified chronic kidney disease: Secondary | ICD-10-CM | POA: Diagnosis not present

## 2014-11-08 DIAGNOSIS — D500 Iron deficiency anemia secondary to blood loss (chronic): Secondary | ICD-10-CM

## 2014-11-08 DIAGNOSIS — Z794 Long term (current) use of insulin: Secondary | ICD-10-CM | POA: Diagnosis not present

## 2014-11-08 DIAGNOSIS — D638 Anemia in other chronic diseases classified elsewhere: Secondary | ICD-10-CM | POA: Diagnosis not present

## 2014-11-08 MED ORDER — HEPARIN SOD (PORK) LOCK FLUSH 100 UNIT/ML IV SOLN: 500.0000 [IU] | Freq: Every day | INTRAVENOUS | Status: DC | PRN

## 2014-11-08 MED ORDER — SODIUM CHLORIDE 0.9 % IV SOLN: 250.0000 mL | Freq: Once | INTRAVENOUS | Status: DC

## 2014-11-08 MED ORDER — SODIUM CHLORIDE 0.9 % IJ SOLN: 3.0000 mL | INTRAMUSCULAR | Status: DC | PRN

## 2014-11-08 MED ORDER — SODIUM CHLORIDE 0.9 % IJ SOLN: 10.0000 mL | INTRAMUSCULAR | Status: DC | PRN

## 2014-11-08 MED ORDER — HEPARIN SOD (PORK) LOCK FLUSH 100 UNIT/ML IV SOLN: 250.0000 [IU] | INTRAVENOUS | Status: DC | PRN

## 2014-11-09 ENCOUNTER — Ambulatory Visit (INDEPENDENT_AMBULATORY_CARE_PROVIDER_SITE_OTHER): Payer: Medicare Other | Admitting: Internal Medicine

## 2014-11-09 VITALS — BP 106/36 | HR 64 | Temp 97.9°F | Ht 61.0 in | Wt 145.1 lb

## 2014-11-09 DIAGNOSIS — E1129 Type 2 diabetes mellitus with other diabetic kidney complication: Secondary | ICD-10-CM | POA: Diagnosis not present

## 2014-11-09 DIAGNOSIS — Q2733 Arteriovenous malformation of digestive system vessel: Secondary | ICD-10-CM | POA: Diagnosis not present

## 2014-11-09 DIAGNOSIS — E1165 Type 2 diabetes mellitus with hyperglycemia: Secondary | ICD-10-CM

## 2014-11-09 DIAGNOSIS — IMO0002 Reserved for concepts with insufficient information to code with codable children: Secondary | ICD-10-CM

## 2014-11-09 DIAGNOSIS — K31819 Angiodysplasia of stomach and duodenum without bleeding: Secondary | ICD-10-CM

## 2014-11-09 DIAGNOSIS — R569 Unspecified convulsions: Secondary | ICD-10-CM

## 2014-11-09 DIAGNOSIS — I5032 Chronic diastolic (congestive) heart failure: Secondary | ICD-10-CM | POA: Diagnosis not present

## 2014-11-09 DIAGNOSIS — D509 Iron deficiency anemia, unspecified: Secondary | ICD-10-CM | POA: Diagnosis not present

## 2014-11-09 DIAGNOSIS — I10 Essential (primary) hypertension: Secondary | ICD-10-CM

## 2014-11-09 DIAGNOSIS — I110 Hypertensive heart disease with heart failure: Secondary | ICD-10-CM | POA: Diagnosis not present

## 2014-11-09 DIAGNOSIS — N183 Chronic kidney disease, stage 3 (moderate): Secondary | ICD-10-CM | POA: Diagnosis not present

## 2014-11-09 DIAGNOSIS — Z794 Long term (current) use of insulin: Secondary | ICD-10-CM | POA: Diagnosis not present

## 2014-11-09 DIAGNOSIS — I780 Hereditary hemorrhagic telangiectasia: Secondary | ICD-10-CM | POA: Diagnosis not present

## 2014-11-09 DIAGNOSIS — I129 Hypertensive chronic kidney disease with stage 1 through stage 4 chronic kidney disease, or unspecified chronic kidney disease: Secondary | ICD-10-CM | POA: Diagnosis not present

## 2014-11-09 DIAGNOSIS — D638 Anemia in other chronic diseases classified elsewhere: Secondary | ICD-10-CM | POA: Diagnosis not present

## 2014-11-09 DIAGNOSIS — Z79899 Other long term (current) drug therapy: Secondary | ICD-10-CM

## 2014-11-09 DIAGNOSIS — I5030 Unspecified diastolic (congestive) heart failure: Secondary | ICD-10-CM | POA: Diagnosis not present

## 2014-11-09 DIAGNOSIS — G40909 Epilepsy, unspecified, not intractable, without status epilepticus: Secondary | ICD-10-CM | POA: Diagnosis not present

## 2014-11-09 DIAGNOSIS — I82409 Acute embolism and thrombosis of unspecified deep veins of unspecified lower extremity: Secondary | ICD-10-CM | POA: Diagnosis not present

## 2014-11-09 DIAGNOSIS — E119 Type 2 diabetes mellitus without complications: Secondary | ICD-10-CM

## 2014-11-09 NOTE — Progress Notes (Signed)
Patient ID: RAFAELITA GREEVER, female   DOB: [birth year], 74 y.o.   MRN: [id number]   Subjective:   Patient ID: ATOYA CUPO female   DOB: Sep 14, 1940 74 y.o.   MRN: [id number]  HPI: Ms.Halyn L Laskin is a 74 y.o. with PMh listed below. Presented today to establish care here. She has no complaints today. She has a hx of osler weber rendu syndrome, with frequent GI bleeds, due to gastric AVMs, says she has had one episode of vomiting since her last blood transfusion- 10/31/2014. Her stools are dark but she is on iron suppliments, she denies blood in her stools. She has had her blood work checked after this and it was normal. She has had multiple blood transfusions.  She has a hx of diastolic heart failure, denies chest pain, SOB, cough, has chronic mild leg swelling, which today is not an issue.  She also had a hx of altered mental status with seizure-like activity, was started on Lactulose and keppra. She has been having 2-4 bowel movements a day. Her ammonia level on that admission was increased- ~300, per family she was constipated but per chart no documented liver diease. Husband says her mental status is at baseline.  Past Medical History  Diagnosis Date  . Anemia   . S/P endoscopy     with laser treatment  08/19/2006  . History of epistaxis   . Hyperlipidemia   . Telangiectasia     Gastric   . Lichen planus     Both lower extremities  . GERD (gastroesophageal reflux disease)   . Heart murmur   . CHF (congestive heart failure)   . Type II diabetes mellitus     insulin requiring.  Marland Kitchen History of blood transfusion "several"  . Chronic diastolic CHF (congestive heart failure) 10/03/2013  . HTN (hypertension), benign 03/02/2012  . Family history of anesthesia complication     "niece has a hard time coming out" (09/15/2012)  . Pneumonia 1990's X 2  . Seizures     "they think she might be having seizures; they are checking her for that now" (10/02/2014)   Current Outpatient Prescriptions    Medication Sig Dispense Refill  . AMITIZA 24 MCG capsule Take 24 mcg by mouth 2 (two) times daily.    Marland Kitchen amitriptyline (ELAVIL) 25 MG tablet Take 25 mg by mouth daily.    Marland Kitchen amLODipine (NORVASC) 5 MG tablet Take 5 mg by mouth daily.    . cyclobenzaprine (FLEXERIL) 5 MG tablet Take 1 tablet (5 mg total) by mouth 3 (three) times daily as needed for muscle spasms. 15 tablet 0  . enalapril (VASOTEC) 20 MG tablet Take 20 mg by mouth daily.    . ferrous fumarate (HEMOCYTE - 106 MG FE) 325 (106 FE) MG TABS tablet Take 1 tablet by mouth daily.    . furosemide (LASIX) 40 MG tablet Take 40mg  twice daily for two days and then start taking 40mg  once daily. (Patient taking differently: Take 40 mg by mouth daily. ) 90 tablet 3  . insulin detemir (LEVEMIR) 100 UNIT/ML injection Inject 0.3 mLs (30 Units total) into the skin at bedtime. (Patient taking differently: Inject 30 Units into the skin 2 (two) times daily. ) 10 mL 11  . lactulose (CHRONULAC) 10 GM/15ML solution Take 15 mLs (10 g total) by mouth 2 (two) times daily. 240 mL 0  . levETIRAcetam (KEPPRA) 500 MG tablet Take 1 tablet (500 mg total) by mouth 2 (two) times daily. 60 tablet  6  . metoprolol succinate (TOPROL XL) 25 MG 24 hr tablet Take 0.5 tablets (12.5 mg total) by mouth daily. 30 tablet 6  . oxymetazoline (AFRIN) 0.05 % nasal spray Place 1 spray into both nostrils 2 (two) times daily as needed (Epistaxis). 30 mL 0  . pantoprazole (PROTONIX) 40 MG tablet Take 1 tablet (40 mg total) by mouth 2 (two) times daily. 60 tablet 1   No current facility-administered medications for this visit.   Family History  Problem Relation Age of Onset  . Breast cancer    . Malignant hyperthermia Neg Hx   . Seizures Neg Hx   . Stroke Father    Social History   Social History  . Marital Status: Married    Spouse Name: [name]  . Number of Children: 0  . Years of Education: 9   Occupational History  . Retired Engineer, manufacturing systems    Social History Main Topics  .  Smoking status: Former Smoker -- 2 years    Types: Cigarettes    Quit date: 02/10/1971  . Smokeless tobacco: Never Used     Comment: 09/15/2012 "smoked 50-60 yr ago"  . Alcohol Use: No  . Drug Use: No  . Sexual Activity: No   Other Topics Concern  . Not on file   Social History Narrative   Married, lives with husband, [name].  Ambulates without assistance.     Caffeine use: none   Review of Systems: CONSTITUTIONAL- No Fever, weightloss, night sweat or change in appetite. SKIN- No Rash, colour changes or itching. HEAD- No Headache or dizziness. Mouth/throat- No Sorethroat, or bleeding gums. RESPIRATORY- No Cough or SOB. CARDIAC- No Palpitations, or chest pain. GI- No vomiting, diarrhoea, abd pain. URINARY- No Frequency, or dysuria. NEUROLOGIC- No Numbness, syncope, seizures or burning. [facility]- Denies depression or anxiety.  Objective:  Physical Exam: Filed Vitals:   11/09/14 1450  BP: 106/36  Pulse: 64  Temp: 97.9 F (36.6 C)  TempSrc: Oral  Height: 5\' 1"  (1.549 m)  Weight: 145 lb 1.6 oz (65.817 kg)  SpO2: 100%   GENERAL- alert, co-operative, appears as stated age, not in any distress. HEENT- Atraumatic, normocephalic, PERRL, EOMI, oral mucosa appears moist,  neck supple. CARDIAC- RRR, no murmurs, rubs or gallops. RESP- Moving equal volumes of air, and clear to auscultation bilaterally, no wheezes or crackles. ABDOMEN- Soft, nontender, bowel sounds present. BACK- Normal curvature of the spine, no CVA tenderness. NEURO- Alert and oriented to TPP, Gait- Normal. EXTREMITIES- pulse 2+, symmetric, no pedal edema. SKIN- Warm, dry, No rash or lesion. PSYCH- Normal mood and affect, appropriate thought content and speech.  Assessment & Plan:   The patient's case and plan of care was discussed with attending physician, Dr. [name].  Please see problem based charting for assessment and plan.

## 2014-11-09 NOTE — Assessment & Plan Note (Signed)
New pt here today, establishing care, used to be seen at [facility]. Last ECHO- 65- [id number] Grade 2 diastolic dysfunction. No sign of fluid overload today. She feels her weight is stable, per chart she is 145 today, her weight has ranged from 130- 150s over the past- 2 years, with 130s been more recent over the past month.   - Will continue lasix- 40mg  daily - metop 12.29mfg daily - Also on enalapril- 20mg  daily - Will d/c norvasc considering low Bp.

## 2014-11-09 NOTE — Assessment & Plan Note (Addendum)
Presented to the hospital- 8/22016 with AMS with seizure like activity. Ammonia level elevated- ~300, pt was also constipated then per family. Ammonia has trended down to 72 - 9/21.  Pt was started on keppra and lactulose. Husband feels she is at her baseline.  She has ben compliant with her meds. She has no hx of liver diease. She presently has 2-4 bowel movements a day  Plan- Consider stopping lactulose and giving just stool softners, on her next visit. As she has no hx of liver disease. Her elevated ammonia levels could be due to GI bleed and constipation at that time.  - Will not stop meds now, considering this is her first visit, and AMS occurred just last month.

## 2014-11-09 NOTE — Assessment & Plan Note (Addendum)
Last EGD- 7/16- showed a large AVM in the greater curveture of the stomach. She ha shad multiple blood transfusions. Says she has a brother that has something similar. Her last blood transfusion- 10/31/2014, she has had one episode of vomiting blood since then , CBC was checked after thi sand was 8. She has iron defc for which she is on fe suppliments. She denies bloody stools.   Plan- CBC today - Encourage continued iron supplimentation - Called patient to inform her about her appointment at [facility] for 1.30pm on Monday 3rd of October.

## 2014-11-09 NOTE — Assessment & Plan Note (Addendum)
HgbA1c- 08/2014- 7.3, for her age, controlled. She will bring her glucometer on her next visit. She is presently on Levemir- 30u uBID and metformin. She denies having an low blood sugars.

## 2014-11-09 NOTE — Patient Instructions (Signed)
We will be checking your blood counts today.   Please check your blood sugars everyday, two times a day, morning and night. Please bring your glucometer on your next visit.   We will see you in 1 month.

## 2014-11-09 NOTE — Assessment & Plan Note (Addendum)
BP Readings from Last 3 Encounters:  11/09/14 106/36  10/31/14 99/71  10/31/14 103/37    Lab Results  Component Value Date   NA 138 10/31/2014   K 3.6 10/31/2014   CREATININE 1.12* 10/31/2014    Assessment: Blood pressure control:  Controlled, new patient establishing care here today, was previously a patient at [facility]. Comments: Appears to be slightly low, not dizzy  Plan: Medications:   Considering low blood pressure will dicontinue Norvasc 5mg  for now, continue BB- metoprolol XR 12.5mg  daily.  Educational resources provided:   Self management tools provided:   Other plans:

## 2014-11-10 LAB — CBC
HEMATOCRIT: 23.2 % — AB (ref 34.0–46.6)
Hemoglobin: 7.3 g/dL — ABNORMAL LOW (ref 11.1–15.9)
MCH: 28.2 pg (ref 26.6–33.0)
MCHC: 31.5 g/dL (ref 31.5–35.7)
MCV: 90 fL (ref 79–97)
PLATELETS: 237 10*3/uL (ref 150–379)
RBC: 2.59 x10E6/uL — AB (ref 3.77–5.28)
RDW: 17.8 % — ABNORMAL HIGH (ref 12.3–15.4)
WBC: 5.5 10*3/uL (ref 3.4–10.8)

## 2014-11-12 ENCOUNTER — Ambulatory Visit (HOSPITAL_COMMUNITY)
Admission: RE | Admit: 2014-11-12 | Discharge: 2014-11-12 | Disposition: A | Discharge status: Home / Self Care | Payer: Medicare Other | Source: Ambulatory Visit | Attending: Oncology | Admitting: Oncology

## 2014-11-12 ENCOUNTER — Other Ambulatory Visit (HOSPITAL_BASED_OUTPATIENT_CLINIC_OR_DEPARTMENT_OTHER): Payer: Medicare Other

## 2014-11-12 ENCOUNTER — Telehealth: Payer: Self-pay | Admitting: *Deleted

## 2014-11-12 ENCOUNTER — Ambulatory Visit (HOSPITAL_BASED_OUTPATIENT_CLINIC_OR_DEPARTMENT_OTHER): Payer: Medicare Other

## 2014-11-12 VITALS — BP 97/36 | HR 63 | Temp 98.0°F | Resp 16

## 2014-11-12 DIAGNOSIS — D649 Anemia, unspecified: Secondary | ICD-10-CM | POA: Diagnosis not present

## 2014-11-12 DIAGNOSIS — Q2733 Arteriovenous malformation of digestive system vessel: Secondary | ICD-10-CM

## 2014-11-12 DIAGNOSIS — D500 Iron deficiency anemia secondary to blood loss (chronic): Secondary | ICD-10-CM

## 2014-11-12 DIAGNOSIS — I780 Hereditary hemorrhagic telangiectasia: Secondary | ICD-10-CM

## 2014-11-12 DIAGNOSIS — K31819 Angiodysplasia of stomach and duodenum without bleeding: Secondary | ICD-10-CM

## 2014-11-12 LAB — CBC WITH DIFFERENTIAL/PLATELET
BASO%: 0.4 % (ref 0.0–2.0)
Basophils Absolute: 0 10*3/uL (ref 0.0–0.1)
EOS ABS: 0.2 10*3/uL (ref 0.0–0.5)
EOS%: 2.3 % (ref 0.0–7.0)
HEMATOCRIT: 23.1 % — AB (ref 34.8–46.6)
HEMOGLOBIN: 7.3 g/dL — AB (ref 11.6–15.9)
LYMPH#: 0.7 10*3/uL — AB (ref 0.9–3.3)
LYMPH%: 11.1 % — AB (ref 14.0–49.7)
MCH: 27.3 pg (ref 25.1–34.0)
MCHC: 31.6 g/dL (ref 31.5–36.0)
MCV: 86.4 fL (ref 79.5–101.0)
MONO#: 0.9 10*3/uL (ref 0.1–0.9)
MONO%: 13.6 % (ref 0.0–14.0)
NEUT%: 72.6 % (ref 38.4–76.8)
NEUTROS ABS: 4.9 10*3/uL (ref 1.5–6.5)
Platelets: 264 10*3/uL (ref 145–400)
RBC: 2.67 10*6/uL — AB (ref 3.70–5.45)
RDW: 18.9 % — AB (ref 11.2–14.5)
WBC: 6.7 10*3/uL (ref 3.9–10.3)

## 2014-11-12 LAB — FERRITIN CHCC: FERRITIN: 24 ng/mL (ref 9–269)

## 2014-11-12 LAB — HOLD TUBE, BLOOD BANK

## 2014-11-12 LAB — PREPARE RBC (CROSSMATCH)

## 2014-11-12 MED ORDER — SODIUM CHLORIDE 0.9 % IJ SOLN
10.0000 mL | INTRAMUSCULAR | Status: AC | PRN
  Administered 2014-11-12: 10 mL
  Filled 2014-11-12: qty 10

## 2014-11-12 MED ORDER — SODIUM CHLORIDE 0.9 % IV SOLN
250.0000 mL | Freq: Once | INTRAVENOUS | Status: AC
Start: 2014-11-12 — End: 2014-11-12
  Administered 2014-11-12: 250 mL via INTRAVENOUS

## 2014-11-12 MED ORDER — HEPARIN SOD (PORK) LOCK FLUSH 100 UNIT/ML IV SOLN
500.0000 [IU] | Freq: Every day | INTRAVENOUS | Status: AC | PRN
  Administered 2014-11-12: 500 [IU]
  Filled 2014-11-12: qty 5

## 2014-11-12 NOTE — Telephone Encounter (Signed)
-----   Message from [name], MD sent at 11/08/2014 12:33 PM EDT ----- Call pt: [facility]: 1:30 PM Monday to lab with 1 unit transfusion at 2:30 Then back Tuesday at 2:30 for 2nd unit.

## 2014-11-12 NOTE — Telephone Encounter (Signed)
Pt called / [facility] @ 1330 PM then 1 unit of blood @ 1430 PM. Then return tomorrow @ 1430 PM for second unit per Dr [name], Pt voiced understanding; I apolpgized for the late call.

## 2014-11-12 NOTE — Patient Instructions (Signed)

## 2014-11-13 ENCOUNTER — Encounter: Payer: Self-pay | Admitting: Nurse Practitioner

## 2014-11-13 ENCOUNTER — Ambulatory Visit (HOSPITAL_BASED_OUTPATIENT_CLINIC_OR_DEPARTMENT_OTHER): Payer: Medicare Other | Admitting: Nurse Practitioner

## 2014-11-13 ENCOUNTER — Telehealth: Payer: Self-pay | Admitting: Cardiology

## 2014-11-13 ENCOUNTER — Ambulatory Visit (HOSPITAL_BASED_OUTPATIENT_CLINIC_OR_DEPARTMENT_OTHER): Payer: Medicare Other

## 2014-11-13 VITALS — BP 102/35 | HR 61 | Temp 98.2°F | Resp 18

## 2014-11-13 DIAGNOSIS — D649 Anemia, unspecified: Secondary | ICD-10-CM | POA: Diagnosis not present

## 2014-11-13 DIAGNOSIS — I959 Hypotension, unspecified: Secondary | ICD-10-CM | POA: Diagnosis not present

## 2014-11-13 DIAGNOSIS — D509 Iron deficiency anemia, unspecified: Secondary | ICD-10-CM

## 2014-11-13 MED ORDER — SODIUM CHLORIDE 0.9 % IV SOLN
250.0000 mL | Freq: Once | INTRAVENOUS | Status: AC
  Administered 2014-11-13: 250 mL via INTRAVENOUS

## 2014-11-13 MED ORDER — SODIUM CHLORIDE 0.9 % IJ SOLN
10.0000 mL | INTRAMUSCULAR | Status: AC | PRN
  Administered 2014-11-13: 10 mL
  Filled 2014-11-13: qty 10

## 2014-11-13 MED ORDER — HEPARIN SOD (PORK) LOCK FLUSH 100 UNIT/ML IV SOLN
500.0000 [IU] | Freq: Every day | INTRAVENOUS | Status: AC | PRN
  Administered 2014-11-13: 500 [IU]
  Filled 2014-11-13: qty 5

## 2014-11-13 NOTE — Telephone Encounter (Signed)
Called Jan, RN. She st she gave the patient some soup and fluids and her BP came up to 115/50's. Concerned that patient may be taking medications incorrectly or need adjustments, instructed Jan to tell patient not to take medications at all tomorrow until told otherwise.  Verified OV with Richardson Dopp tomorrow at 1440 to review medications. Jan and patient agree with treatment plan.

## 2014-11-13 NOTE — Telephone Encounter (Signed)
New message      Pt is having a blood transfusion now.  Her bp dropped to 56/37.  The nurse said this pt is on 3 different bp medications by 3 different physicians.  Please call

## 2014-11-13 NOTE — Assessment & Plan Note (Addendum)
Patient has a history of chronic iron deficiency anemia.  Blood counts obtained just yesterday 11/12/2014 revealed a WBC of 6.7, ANC 4.9, hemoglobin 7.3, and platelet count 264.  Ferritin was in normal range.  Dr. [name] arrange for patient to receive 1 unit of blood yesterday 11/12/2014; and patient presented to the [facility] today to receive her second unit of blood.  Patient developed a brief episode of hypotension; but quickly recovered to her baseline.  Patient is scheduled to return 03/18/2015 for her next follow-up visit with Dr. [name].

## 2014-11-13 NOTE — Progress Notes (Signed)
Cardiology Office Note   Date:  11/14/2014   ID:  [name], DOB 02-11-1940, MRN [id number]  PCP:  [name], DO  Cardiologist:  Dr. Fransico Him   Electrophysiologist:  n/a  Chief Complaint  Patient presents with  . Follow-up    Blood Pressure  . Congestive Heart Failure     History of Present Illness: [name] is a 74 y.o. female with a hx of diastolic HF, Hereditary hemorrhagic telangiectasia, gastric AVMs, chronic iron deficiency anemia requiring transfusion, HTN, HL, DM2, GERD.  She was admitted earlier this year with GI bleed and Echo demonstrated normal LVF, mod diastolic dysfunction and severe asymmetric septal hypertrophy.  Last seen by Dr. Fransico Him 7/16.  She was placed on beta-blocker, Amlodipine and Lasix was added to her regimen (BNP elevated).     Admitted in 7/16 with AKI in the setting of UTI.  Lasix was briefly held.  She was admitted again in 7/16 with a/c anemia in the setting of chronic GI blood loss.  She required transfusion with PRBCs.  She was also admitted in 8/16 with seizures and was placed on Keppra.  Seizures were thought to be provoked by elevated ammonia level and she has been placed on Lactulose with improved NH4 levels.  Recently established with IM clinic and it is suspected the high NH4 levels were related to GI bleeding and constipation.  BP was low at that visit and Norvasc was stopped.    She was noted to have a drop in her Hgb yesterday and Hematology arranged PRBCs x 2.  When she was at the [facility] yesterday for her 2nd transfusion, she became hypotensive.  Notes indicate her BP was 56/37 at one point.  However, her repeat BP was 103/35 with "no interventions."  FU was arranged here today.  She denies syncope or near syncope.  She denies fatigue. She does note issues with her balance.  She is weak.  She denies chest pain. She gets short of breath with mild to mod activities.  She denies PND.  She sleeps on 3 pillows  chronically.  She denies LE edema.  No cough or wheezing.    Studies/Reports Reviewed Today:  Echo 5/16 Mild LVH with moderate to severe asymmetric hypertrophy of the septum, EF 65-70%, normal wall motion, grade 2 diastolic dysfunction, LVOT peak gradient 17 mmHg, MAC, mild MR, severe LAE, aortic sclerosis, mild TR, no significant SAM   Past Medical History  Diagnosis Date  . Anemia   . S/P endoscopy     with laser treatment  08/19/2006  . History of epistaxis   . Hyperlipidemia   . Telangiectasia     Gastric   . Lichen planus     Both lower extremities  . GERD (gastroesophageal reflux disease)   . Heart murmur   . CHF (congestive heart failure) ([place])   . Type II diabetes mellitus ([name]     insulin requiring.  Marland Kitchen History of blood transfusion "several"  . Chronic diastolic CHF (congestive heart failure) ([place]) 10/03/2013  . HTN (hypertension), benign 03/02/2012  . Family history of anesthesia complication     "niece has a hard time coming out" (09/15/2012)  . Pneumonia 1990's X 2  . Seizures ([place])     "they think she might be having seizures; they are checking her for that now" (10/02/2014)    Past Surgical History  Procedure Laterality Date  . Nose surgery      "for bleeding"   .  Savory dilation  02/26/2011    Procedure: SAVORY DILATION;  Surgeon: [name], MD;  Location: [name];  Service: Endoscopy;  Laterality: N/A;  c-arm needed  . Esophagogastroduodenoscopy  02/26/2011    Procedure: ESOPHAGOGASTRODUODENOSCOPY (EGD);  Surgeon: [name], MD;  Location: [name];  Service: Endoscopy;  Laterality: N/A;  . Esophagogastroduodenoscopy N/A 11/08/2012    Procedure: ESOPHAGOGASTRODUODENOSCOPY (EGD);  Surgeon: [name], MD;  Location: [name];  Service: Endoscopy;  Laterality: N/A;  . Cataract extraction      "I think it was just one eye"  . Esophagogastroduodenoscopy N/A 10/04/2013    Procedure: ESOPHAGOGASTRODUODENOSCOPY (EGD);  Surgeon: Winfield Cunas., MD;  Location: [name];  Service: Endoscopy;  Laterality: N/A;  with APC on stand-by  . Esophagogastroduodenoscopy N/A 07/06/2014    Procedure: ESOPHAGOGASTRODUODENOSCOPY (EGD);  Surgeon: Clarene Essex, MD;  Location: [name];  Service: Endoscopy;  Laterality: N/A;  . Hot hemostasis N/A 07/06/2014    Procedure: HOT HEMOSTASIS (ARGON PLASMA COAGULATION/BICAP);  Surgeon: Clarene Essex, MD;  Location: [name];  Service: Endoscopy;  Laterality: N/A;  . Esophagogastroduodenoscopy N/A 09/05/2014    Procedure: ESOPHAGOGASTRODUODENOSCOPY (EGD);  Surgeon: Laurence Spates, MD;  Location: [name];  Service: Endoscopy;  Laterality: N/A;  APC on standby to control bleeding     Current Outpatient Prescriptions  Medication Sig Dispense Refill  . AMITIZA 24 MCG capsule Take 24 mcg by mouth 2 (two) times daily.    Marland Kitchen amitriptyline (ELAVIL) 25 MG tablet Take 25 mg by mouth daily.    . cyclobenzaprine (FLEXERIL) 5 MG tablet Take 1 tablet (5 mg total) by mouth 3 (three) times daily as needed for muscle spasms. 15 tablet 0  . ferrous fumarate (HEMOCYTE - 106 MG FE) 325 (106 FE) MG TABS tablet Take 1 tablet by mouth daily.    . insulin detemir (LEVEMIR) 100 UNIT/ML injection Inject 0.3 mLs (30 Units total) into the skin at bedtime. (Patient taking differently: Inject 30 Units into the skin 2 (two) times daily. ) 10 mL 11  . lactulose (CHRONULAC) 10 GM/15ML solution Take 15 mLs (10 g total) by mouth 2 (two) times daily. 240 mL 0  . levETIRAcetam (KEPPRA) 500 MG tablet Take 1 tablet (500 mg total) by mouth 2 (two) times daily. 60 tablet 6  . metoprolol succinate (TOPROL XL) 25 MG 24 hr tablet Take 0.5 tablets (12.5 mg total) by mouth daily. 30 tablet 6  . oxymetazoline (AFRIN) 0.05 % nasal spray Place 1 spray into both nostrils 2 (two) times daily as needed (Epistaxis). 30 mL 0  . pantoprazole (PROTONIX) 40 MG tablet Take 1 tablet (40 mg total) by mouth 2 (two) times daily. 60 tablet 1  . furosemide  (LASIX) 40 MG tablet Take 0.5 tablets (20 mg total) by mouth daily.     No current facility-administered medications for this visit.    Allergies:   Aspirin    Social History:  The patient  reports that she quit smoking about 43 years ago. Her smoking use included Cigarettes. She quit after 2 years of use. She has never used smokeless tobacco. She reports that she does not drink alcohol or use illicit drugs.   Family History:  The patient's family history includes Breast cancer in an other family member; Stroke in her father. There is no history of Malignant hyperthermia or Seizures.    ROS:   Please see the history of present illness.   Review of Systems  Constitution: Positive for  decreased appetite.  Respiratory: Positive for shortness of breath.   Skin: Positive for rash.  Gastrointestinal: Positive for diarrhea and hematochezia.  Neurological: Positive for loss of balance.      PHYSICAL EXAM: VS:  BP 125/58 mmHg  Pulse 64  Ht 5\' 3"  (1.6 m)  Wt 145 lb 12.8 oz (66.134 kg)  BMI 25.83 kg/m2    Orthostatic VS for the past 24 hrs:  BP- Lying Pulse- Lying BP- Sitting Pulse- Sitting BP- Standing at 0 minutes Pulse- Standing at 0 minutes  11/14/14 1513 121/56 mmHg 65 116/53 mmHg 64 124/51 mmHg 65   Wt Readings from Last 3 Encounters:  11/14/14 145 lb 12.8 oz (66.134 kg)  11/09/14 145 lb 1.6 oz (65.817 kg)  10/31/14 137 lb (62.143 kg)     GEN: Well nourished, well developed, in no acute distress HEENT: normal Neck: JVP 4-5 cm,   no masses Cardiac:  Normal 99991111, RRR; 2/6 systolic murmur RUSB/LSB,  no rubs or gallops, no edema   Respiratory:  clear to auscultation bilaterally, no wheezing, rhonchi or rales. GI: soft, nontender, nondistended, + BS MS: no deformity or atrophy Skin: warm and dry  Neuro:  CNs II-XII intact, Strength and sensation are intact Psych: Normal affect   EKG:  EKG is ordered today.  It demonstrates:   NSR, HR 64, normal axis, QTc 441   Recent  Labs: 07/27/2014: Pro B Natriuretic peptide (BNP) 866.0* 08/27/2014: Magnesium 1.7 09/05/2014: TSH 1.238 10/03/2014: B Natriuretic Peptide 100.0 10/31/2014: ALT 22; BUN 25*; Creatinine, Ser 1.12*; Potassium 3.6; Sodium 138 11/12/2014: HGB 7.3*; Platelets 264    Lipid Panel    Component Value Date/Time   CHOL  01/19/2009 0630    191        ATP III CLASSIFICATION:  <200     mg/dL   Desirable  200-239  mg/dL   Borderline High  >=240    mg/dL   High          TRIG 184* 01/19/2009 0630   HDL 45 01/19/2009 0630   CHOLHDL 4.2 01/19/2009 0630   VLDL 37 01/19/2009 0630   LDLCALC * 01/19/2009 0630    109        Total Cholesterol/HDL:CHD Risk Coronary Heart Disease Risk Table                     Men   Women  1/2 Average Risk   3.4   3.3  Average Risk       5.0   4.4  2 X Average Risk   9.6   7.1  3 X Average Risk  23.4   11.0        Use the calculated Patient Ratio above and the CHD Risk Table to determine the patient's CHD Risk.        ATP III CLASSIFICATION (LDL):  <100     mg/dL   Optimal  100-129  mg/dL   Near or Above                    Optimal  130-159  mg/dL   Borderline  160-189  mg/dL   High  >190     mg/dL   Very High      ASSESSMENT AND PLAN:  1. HTN:  BP has run low recently and yesterday it was reportedly 56/37.  Norvasc was recently DC'd prior to this by IM b/c of low BP.  She did not stop this medication.  She ran out of Enalapril 2 days ago.  Has not taken anything today and BP is optimal.  Orthostatic VS are normal.    -  DC Enalapril  -  DC Norvasc  -  Continue Toprol-XL 12.5 mg QD  -  Decrease Lasix to 20 mg QD  -  Check BMET today.  -  Monitor BP and record.  Bring list to next OV.  See Instructions.   2. Chronic Diastolic CHF:  Volume stable.  She has had some HF symptoms in the past and has been on Lasix for several mos.  Will decrease Lasix as noted.  Check BMET.  We discussed the importance of daily weights and when to call.  3. Hypertrophic  Cardiomyopathy:  Prior echo with severe asymmetric septal hypertrophy and no significant LVOT gradient.  Will continue beta-blocker to increase diastolic filling time.   4. Chronic Anemia:  Iron deficiency anemia in the setting of chronic GI blood loss.  She gets frequent transfusions with PRBCs and is followed by hematology.  I have recommended she take Lasix 40 mg on days she gets a transfusion.  5. 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  6. Seizure Disorder:  On Keppra.  FU with Neuro.  7. Hyperammonemia:  Possibly related to GI bleed and constipation.  Controlled by Lactulose.  Managed by IM.     Medication Changes: Current medicines are reviewed at length with the patient today.  Concerns regarding medicines are as outlined above.  The following changes have been made:   Discontinued Medications   AMLODIPINE (NORVASC) 5 MG TABLET       ENALAPRIL (VASOTEC) 20 MG TABLET    Take 20 mg by mouth daily.   FUROSEMIDE (LASIX) 40 MG TABLET    Take 40mg  twice daily for two days and then start taking 40mg  once daily.   Modified Medications   No medications on file   New Prescriptions   FUROSEMIDE (LASIX) 40 MG TABLET    Take 0.5 tablets (20 mg total) by mouth daily.   Labs/ tests ordered today include:   Orders Placed This Encounter  Procedures  . Basic Metabolic Panel (BMET)  . EKG 12-Lead     Disposition:    FU with me in 2 weeks.     Signed, Versie Starks, MHS 11/14/2014 4:11 PM    [facility] [address], [place], [place]  [address] Phone: [phone]; Fax: [phone]

## 2014-11-13 NOTE — Progress Notes (Signed)
Patient states she is feeling pretty good, has her appetite back and has gained back some of the weight she lost. Patient's blood pressure dropped to 56/37.  She appears very sleepy.  Retta Mac NP notified.  Cyndee here, talking with patient.  B/P rechecked 103/35.   Noted that patient is on multiple blood pressure meds from 3 different providers.  Called cardiologist, Dr. Fransico Him [phone] and let her know that patient is on vasotec, toprol XL and lasix and that her blood pressure dropped her to 56/37.  Dr. Theodosia Blender office to call us back with instructions for patient regarding blood pressure.

## 2014-11-13 NOTE — Progress Notes (Signed)
SYMPTOM MANAGEMENT CLINIC   HPI: [name] 74 y.o. female diagnosed with an deficiency anemia.  Currently undergoing transfusions on an as-needed basis.  Patient has a history of chronic iron deficiency anemia.  Blood counts obtained just yesterday 11/12/2014 revealed a WBC of 6.7, ANC 4.9, hemoglobin 7.3, and platelet count 264.  Ferritin was in normal range.  Dr. Waymon Budge arrange for patient to receive 1 unit of blood yesterday 11/12/2014; and patient presented to the [facility] today to receive her second unit of blood.  Patient developed a brief episode of hypotension down to 56/37; but very quickly returned to her baseline with blood pressure 103/35 with no interventions.  Blood pressure on recheck was 115/50.  Patient was asymptomatic throughout this brief episode.  Confirmed the patient does take Vasotec, Toprol, XL, and Lasix on a daily basis as directed.  [place] nurse called patient's cardiologist Dr. Fransico Him; and was advised that patient should follow-up with Richardson Dopp, physician assistant in the cardiologist office tomorrow 11/14/2014 for further evaluation and management.  Patient was advised to go directly to the emergency department overnight.  If she develops any worsening symptoms whatsoever.  HPI  ROS  Past Medical History  Diagnosis Date  . Anemia   . S/P endoscopy     with laser treatment  08/19/2006  . History of epistaxis   . Hyperlipidemia   . Telangiectasia     Gastric   . Lichen planus     Both lower extremities  . GERD (gastroesophageal reflux disease)   . Heart murmur   . CHF (congestive heart failure) ([place])   . Type II diabetes mellitus ([name]     insulin requiring.  Marland Kitchen History of blood transfusion "several"  . Chronic diastolic CHF (congestive heart failure) ([place]) 10/03/2013  . HTN (hypertension), benign 03/02/2012  . Family history of anesthesia complication     "niece has a hard time coming out" (09/15/2012)  .  Pneumonia 1990's X 2  . Seizures ([place])     "they think she might be having seizures; they are checking her for that now" (10/02/2014)    Past Surgical History  Procedure Laterality Date  . Nose surgery      "for bleeding"   . Savory dilation  02/26/2011    Procedure: SAVORY DILATION;  Surgeon: [name], MD;  Location: [name];  Service: Endoscopy;  Laterality: N/A;  c-arm needed  . Esophagogastroduodenoscopy  02/26/2011    Procedure: ESOPHAGOGASTRODUODENOSCOPY (EGD);  Surgeon: [name], MD;  Location: [name];  Service: Endoscopy;  Laterality: N/A;  . Esophagogastroduodenoscopy N/A 11/08/2012    Procedure: ESOPHAGOGASTRODUODENOSCOPY (EGD);  Surgeon: [name], MD;  Location: [name];  Service: Endoscopy;  Laterality: N/A;  . Cataract extraction      "I think it was just one eye"  . Esophagogastroduodenoscopy N/A 10/04/2013    Procedure: ESOPHAGOGASTRODUODENOSCOPY (EGD);  Surgeon: Winfield Cunas., MD;  Location: [name];  Service: Endoscopy;  Laterality: N/A;  with APC on stand-by  . Esophagogastroduodenoscopy N/A 07/06/2014    Procedure: ESOPHAGOGASTRODUODENOSCOPY (EGD);  Surgeon: Clarene Essex, MD;  Location: [name];  Service: Endoscopy;  Laterality: N/A;  . Hot hemostasis N/A 07/06/2014    Procedure: HOT HEMOSTASIS (ARGON PLASMA COAGULATION/BICAP);  Surgeon: Clarene Essex, MD;  Location: [name];  Service: Endoscopy;  Laterality: N/A;  . 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    is allergic to aspirin.    Medication List       This list is accurate as of: 11/13/14  6:00 PM.  Always use your most recent med list.               AMITIZA 24 MCG capsule  Generic drug:  lubiprostone  Take 24 mcg by mouth 2 (two) times daily.     amitriptyline 25 MG tablet  Commonly known as:  ELAVIL  Take 25 mg by mouth daily.     cyclobenzaprine 5 MG tablet  Commonly known as:  FLEXERIL  Take 1 tablet (5 mg total) by mouth 3 (three) times daily as needed for muscle spasms.     enalapril 20 MG tablet  Commonly known as:  VASOTEC  Take 20 mg by mouth daily.     ferrous fumarate 325 (106 FE) MG Tabs tablet  Commonly known as:  HEMOCYTE - 106 mg FE  Take 1 tablet by mouth daily.     furosemide 40 MG tablet  Commonly known as:  LASIX  Take 40mg  twice daily for two days and then start taking 40mg  once daily.     insulin detemir 100 UNIT/ML injection  Commonly known as:  LEVEMIR  Inject 0.3 mLs (30 Units total) into the skin at bedtime.     lactulose 10 GM/15ML solution  Commonly known as:  CHRONULAC  Take 15 mLs (10 g total) by mouth 2 (two) times daily.     levETIRAcetam 500 MG tablet  Commonly known as:  KEPPRA  Take 1 tablet (500 mg total) by mouth 2 (two) times daily.     metoprolol succinate 25 MG 24 hr tablet  Commonly known as:  TOPROL XL  Take 0.5 tablets (12.5 mg total) by mouth daily.     oxymetazoline 0.05 % nasal spray  Commonly known as:  AFRIN  Place 1 spray into both nostrils 2 (two) times daily as needed (Epistaxis).     pantoprazole 40 MG tablet  Commonly known as:  PROTONIX  Take 1 tablet (40 mg total) by mouth 2 (two) times  daily.         PHYSICAL EXAMINATION  Oncology Vitals 11/13/2014 11/13/2014 11/13/2014 11/13/2014 11/13/2014 11/13/2014 11/12/2014  Height - - - - - - -  Weight - - - - - - -  Weight (lbs) - - - - - - -  BMI (kg/m2) - - - - - - -  Temp 98.2 - - 97.8 98.3 98 98  Pulse 61 59 64 63 64 61 63  Resp 18 - - 20 18 18 16   Resp (Historical as of 09/10/11) - - - - - - -  SpO2 100 - 100 100 100 100 100  BSA (m2) - - - - - - -   BP Readings from Last 3 Encounters:  11/13/14 102/35  11/12/14 97/36  11/09/14 106/36    Physical Exam  Constitutional: She is oriented to person, place, and time and well-developed, well-nourished, and in no distress.  HENT:  Head: Normocephalic and atraumatic.  Eyes: Conjunctivae and EOM are normal. Pupils are equal, round, and reactive to light. Right eye exhibits no discharge. Left eye exhibits no discharge. No scleral icterus.  Neck: Normal range of motion.  Pulmonary/Chest: Effort normal. No respiratory distress.  Musculoskeletal: Normal range of motion.  Neurological: She is alert and oriented to person, place, and time.  Skin: Skin is warm and dry.  Psychiatric: Affect normal.  Nursing note and vitals reviewed.   LABORATORY DATA:. Appointment on 11/12/2014  Component Date Value Ref Range Status  . Hold Tube, Blood Bank 11/12/2014 Type and Crossmatch Added   Final  . Ferritin 11/12/2014 24  9 - 269 ng/ml Final  . WBC 11/12/2014 6.7  3.9 - 10.3 10e3/uL Final  . NEUT# 11/12/2014 4.9  1.5 - 6.5 10e3/uL Final  . HGB 11/12/2014 7.3* 11.6 - 15.9 g/dL Final  . HCT 11/12/2014 23.1* 34.8 - 46.6 % Final  . Platelets 11/12/2014 264  145 - 400 10e3/uL Final  . MCV 11/12/2014 86.4  79.5 - 101.0 fL Final  . MCH 11/12/2014 27.3  25.1 - 34.0 pg Final  . MCHC 11/12/2014 31.6  31.5 - 36.0 g/dL Final  . RBC 11/12/2014 2.67* 3.70 - 5.45 10e6/uL Final  . RDW 11/12/2014 18.9* 11.2 - 14.5 % Final  . lymph# 11/12/2014 0.7* 0.9 - 3.3 10e3/uL Final  . MONO# 11/12/2014 0.9  0.1 -  0.9 10e3/uL Final  . Eosinophils Absolute 11/12/2014 0.2  0.0 - 0.5 10e3/uL Final  . Basophils Absolute 11/12/2014 0.0  0.0 - 0.1 10e3/uL Final  . NEUT% 11/12/2014 72.6  38.4 - 76.8 % Final  . LYMPH% 11/12/2014 11.1* 14.0 - 49.7 % Final  . MONO% 11/12/2014 13.6  0.0 - 14.0 % Final  . EOS% 11/12/2014 2.3  0.0 - 7.0 % Final  . BASO% 11/12/2014 0.4  0.0 - 2.0 % Final  Hospital Outpatient Visit on 11/12/2014  Component Date Value Ref Range Status  . Order Confirmation 11/12/2014 ORDER PROCESSED BY BLOOD BANK   Final  . ABO/RH(D) 11/12/2014 A POS   Final  . Antibody Screen 11/12/2014 NEG   Final  . Sample Expiration 11/12/2014 11/15/2014   Final  . Unit Number 11/12/2014 [id number]   Final  . Blood Component Type 11/12/2014 RED CELLS,LR   Final  . Unit division 11/12/2014 00   Final  . Status of Unit 11/12/2014 ISSUED,FINAL   Final  . Transfusion Status 11/12/2014 OK TO TRANSFUSE   Final  . Crossmatch Result 11/12/2014 Compatible   Final  . Unit Number 11/12/2014 [id number]   Final  . Blood Component Type 11/12/2014 RED CELLS,LR   Final  . Unit division 11/12/2014 00   Final  . Status of Unit 11/12/2014 ISSUED   Final  . Transfusion Status 11/12/2014 OK TO TRANSFUSE   Final  . Crossmatch Result 11/12/2014 Compatible   Final     RADIOGRAPHIC STUDIES: No results found.  ASSESSMENT/PLAN:    Iron deficiency anemia Patient has a history of chronic iron deficiency anemia.  Blood counts obtained just yesterday 11/12/2014 revealed a WBC of 6.7, ANC 4.9, hemoglobin 7.3, and platelet count 264.  Ferritin was in normal range.  Dr. [name] arrange for patient to receive 1 unit of blood yesterday 11/12/2014; and patient presented to the [facility] today to receive her second unit of blood.  Patient developed a  brief episode of hypotension; but quickly recovered to her baseline.  Patient is scheduled to return 03/18/2015 for her next follow-up visit with Dr.  [name].  Hypotension Patient has a history of chronic iron deficiency anemia.  Blood counts obtained just yesterday 11/12/2014 revealed a WBC of 6.7, ANC 4.9, hemoglobin 7.3, and platelet count 264.  Ferritin was in normal range.  Dr. Waymon Budge arrange for patient to receive 1 unit of blood yesterday 11/12/2014; and patient presented to the [facility] today to receive her second unit of blood.  Patient developed a brief episode of hypotension down to 56/37; but very quickly returned to her baseline with blood pressure 103/35 with no interventions.  Blood pressure on recheck was 115/50.  Patient was asymptomatic throughout this brief episode.  Confirmed the patient does take Vasotec, Toprol, XL, and Lasix on a daily basis as directed.  [place] nurse called patient's cardiologist Dr. Fransico Him; and was advised that patient should follow-up with Richardson Dopp, physician assistant in the cardiologist office tomorrow 11/14/2014 for further evaluation and management.  Patient was advised to go directly to the emergency department overnight.  If she develops any worsening symptoms whatsoever. Patient is scheduled to return 03/18/2015 for her next follow-up visit with Dr. [name].   Patient stated understanding of all instructions; and was in agreement with this plan of care. The patient knows to call the clinic with any problems, questions or concerns.   Review/collaboration with Dr. Waymon Budge regarding all aspects of patient's visit today.   Total time spent with patient was 15 minutes;  with greater than 75 percent of that time spent in face to face counseling regarding patient's symptoms,  and coordination of care and follow up.  Disclaimer:This dictation was prepared with Dragon/digital dictation along with [employer]. Any transcriptional errors that result from this process are unintentional.  Drue Second, NP 11/13/2014

## 2014-11-13 NOTE — Patient Instructions (Addendum)
Blood Transfusion Information WHAT IS A BLOOD TRANSFUSION? A transfusion is the replacement of blood or some of its parts. Blood is made up of multiple cells which provide different functions.  Red blood cells carry oxygen and are used for blood loss replacement.  White blood cells fight against infection.  Platelets control bleeding.  Plasma helps clot blood.  Other blood products are available for specialized needs, such as hemophilia or other clotting disorders. BEFORE THE TRANSFUSION  Who gives blood for transfusions?   You may be able to donate blood to be used at a later date on yourself (autologous donation).  Relatives can be asked to donate blood. This is generally not any safer than if you have received blood from a stranger. The same precautions are taken to ensure safety when a relative's blood is donated.  Healthy volunteers who are fully evaluated to make sure their blood is safe. This is blood bank blood. Transfusion therapy is the safest it has ever been in the practice of medicine. Before blood is taken from a donor, a complete history is taken to make sure that person has no history of diseases nor engages in risky social behavior (examples are intravenous drug use or sexual activity with multiple partners). The donor's travel history is screened to minimize risk of transmitting infections, such as malaria. The donated blood is tested for signs of infectious diseases, such as HIV and hepatitis. The blood is then tested to be sure it is compatible with you in order to minimize the chance of a transfusion reaction. If you or a relative donates blood, this is often done in anticipation of surgery and is not appropriate for emergency situations. It takes many days to process the donated blood. RISKS AND COMPLICATIONS Although transfusion therapy is very safe and saves many lives, the main dangers of transfusion include:   Getting an infectious disease.  Developing a  transfusion reaction. This is an allergic reaction to something in the blood you were given. Every precaution is taken to prevent this. The decision to have a blood transfusion has been considered carefully by your caregiver before blood is given. Blood is not given unless the benefits outweigh the risks. AFTER THE TRANSFUSION  Right after receiving a blood transfusion, you will usually feel much better and more energetic. This is especially true if your red blood cells have gotten low (anemic). The transfusion raises the level of the red blood cells which carry oxygen, and this usually causes an energy increase.  The nurse administering the transfusion will monitor you carefully for complications. HOME CARE INSTRUCTIONS  No special instructions are needed after a transfusion. You may find your energy is better. Speak with your caregiver about any limitations on activity for underlying diseases you may have. SEEK MEDICAL CARE IF:   Your condition is not improving after your transfusion.  You develop redness or irritation at the intravenous (IV) site. SEEK IMMEDIATE MEDICAL CARE IF:  Any of the following symptoms occur over the next 12 hours:  Shaking chills.  You have a temperature by mouth above 102 F (38.9 C), not controlled by medicine.  Chest, back, or muscle pain.  People around you feel you are not acting correctly or are confused.  Shortness of breath or difficulty breathing.  Dizziness and fainting.  You get a rash or develop hives.  You have a decrease in urine output.  Your urine turns a dark color or changes to pink, red, or brown. Any of the following   symptoms occur over the next 10 days:  You have a temperature by mouth above 102 F (38.9 C), not controlled by medicine.  Shortness of breath.  Weakness after normal activity.  The white part of the eye turns yellow (jaundice).  You have a decrease in the amount of urine or are urinating less often.  Your  urine turns a dark color or changes to pink, red, or brown. Document Released: 01/24/2000 Document Revised: 04/20/2011 Document Reviewed: 09/12/2007 [facility] Patient Information 2015 [place], [place]. This information is not intended to replace advice given to you by your health care provider. Make sure you discuss any questions you have with your health care provider.  Please go to Dr. Tressia Miners Turner's office tomorrow at 2:30pm.  Please take all your medicine with you.  Check your blood pressure at home tomorrow morning and take this reading with you to Dr. Landis Gandy.  Dr. Landis Gandy office phone is [phone].  Address is [address].   DO  NOT TAKE your vasotec, lasix or toprol XL tomorrow until you have seen Dr. Radford Pax or her associate.

## 2014-11-13 NOTE — Assessment & Plan Note (Addendum)
Patient has a history of chronic iron deficiency anemia.  Blood counts obtained just yesterday 11/12/2014 revealed a WBC of 6.7, ANC 4.9, hemoglobin 7.3, and platelet count 264.  Ferritin was in normal range.  Dr. Waymon Budge arrange for patient to receive 1 unit of blood yesterday 11/12/2014; and patient presented to the [facility] today to receive her second unit of blood.  Patient developed a brief episode of hypotension down to 56/37; but very quickly returned to her baseline with blood pressure 103/35 with no interventions.  Blood pressure on recheck was 115/50.  Patient was asymptomatic throughout this brief episode.  Confirmed the patient does take Vasotec, Toprol, XL, and Lasix on a daily basis as directed.  [place] nurse called patient's cardiologist Dr. Fransico Him; and was advised that patient should follow-up with Richardson Dopp, physician assistant in the cardiologist office tomorrow 11/14/2014 for further evaluation and management.  Patient was advised to go directly to the emergency department overnight.  If she develops any worsening symptoms whatsoever. Patient is scheduled to return 03/18/2015 for her next follow-up visit with Dr. [name].

## 2014-11-14 ENCOUNTER — Encounter: Payer: Self-pay | Admitting: Physician Assistant

## 2014-11-14 ENCOUNTER — Other Ambulatory Visit: Payer: Self-pay | Admitting: Oncology

## 2014-11-14 ENCOUNTER — Ambulatory Visit (INDEPENDENT_AMBULATORY_CARE_PROVIDER_SITE_OTHER): Payer: Medicare Other | Admitting: Physician Assistant

## 2014-11-14 VITALS — BP 125/58 | HR 64 | Ht 63.0 in | Wt 145.8 lb

## 2014-11-14 DIAGNOSIS — D500 Iron deficiency anemia secondary to blood loss (chronic): Secondary | ICD-10-CM

## 2014-11-14 DIAGNOSIS — I5032 Chronic diastolic (congestive) heart failure: Secondary | ICD-10-CM | POA: Diagnosis not present

## 2014-11-14 DIAGNOSIS — I780 Hereditary hemorrhagic telangiectasia: Secondary | ICD-10-CM

## 2014-11-14 DIAGNOSIS — I422 Other hypertrophic cardiomyopathy: Secondary | ICD-10-CM | POA: Diagnosis not present

## 2014-11-14 DIAGNOSIS — R569 Unspecified convulsions: Secondary | ICD-10-CM

## 2014-11-14 DIAGNOSIS — D509 Iron deficiency anemia, unspecified: Secondary | ICD-10-CM

## 2014-11-14 DIAGNOSIS — I10 Essential (primary) hypertension: Secondary | ICD-10-CM | POA: Diagnosis not present

## 2014-11-14 DIAGNOSIS — E7220 Disorder of urea cycle metabolism, unspecified: Secondary | ICD-10-CM

## 2014-11-14 LAB — TYPE AND SCREEN
ABO/RH(D): A POS
Antibody Screen: NEGATIVE
UNIT DIVISION: 0
UNIT DIVISION: 0

## 2014-11-14 MED ORDER — FUROSEMIDE 40 MG PO TABS: 20.0000 mg | ORAL_TABLET | Freq: Every day | ORAL | Status: DC

## 2014-11-14 MED ORDER — FUROSEMIDE 20 MG PO TABS: 20.0000 mg | ORAL_TABLET | Freq: Every day | ORAL | Status: DC

## 2014-11-14 NOTE — Patient Instructions (Addendum)
Medication Instructions:  STOP taking Norvasc (Amlodipine)  STOP taking Enalapril (Vasotec) - I did not send in a refill.  Continue taking Toprol-XL 25 mg 1/2 tablet daily  Decrease Lasix (Furosemide) to 40 mg 1/2 tablet (20 mg) daily. **Do not throw anything away, yet**  Labwork: BMET today.  Testing/Procedures: None   Follow-Up: 10/19/116 WITH [name], PAC FOR A FOLLOW UP    Any Other Special Instructions Will Be Listed Below (If Applicable).  Check your BP once daily and write it down.  Bring that list to your next appointment. If your BP is higher than 160 (top number) or 90 (bottom number), call.  Weigh yourself daily.  If your weight increases 3 lbs in one day or you notice that your legs are swollen or you are more short of breath, call.  If you get another transfusion, take Lasix (Furosemide) 40 mg that day instead of 20 mg (whole tablet instead of a 1/2 tablet).

## 2014-11-15 ENCOUNTER — Telehealth: Payer: Self-pay | Admitting: *Deleted

## 2014-11-15 ENCOUNTER — Telehealth: Payer: Self-pay | Admitting: Oncology

## 2014-11-15 DIAGNOSIS — I5030 Unspecified diastolic (congestive) heart failure: Secondary | ICD-10-CM | POA: Diagnosis not present

## 2014-11-15 DIAGNOSIS — E1129 Type 2 diabetes mellitus with other diabetic kidney complication: Secondary | ICD-10-CM | POA: Diagnosis not present

## 2014-11-15 DIAGNOSIS — I82409 Acute embolism and thrombosis of unspecified deep veins of unspecified lower extremity: Secondary | ICD-10-CM | POA: Diagnosis not present

## 2014-11-15 DIAGNOSIS — I129 Hypertensive chronic kidney disease with stage 1 through stage 4 chronic kidney disease, or unspecified chronic kidney disease: Secondary | ICD-10-CM | POA: Diagnosis not present

## 2014-11-15 DIAGNOSIS — I780 Hereditary hemorrhagic telangiectasia: Secondary | ICD-10-CM | POA: Diagnosis not present

## 2014-11-15 DIAGNOSIS — D638 Anemia in other chronic diseases classified elsewhere: Secondary | ICD-10-CM | POA: Diagnosis not present

## 2014-11-15 DIAGNOSIS — G40909 Epilepsy, unspecified, not intractable, without status epilepticus: Secondary | ICD-10-CM | POA: Diagnosis not present

## 2014-11-15 DIAGNOSIS — Z794 Long term (current) use of insulin: Secondary | ICD-10-CM | POA: Diagnosis not present

## 2014-11-15 DIAGNOSIS — N183 Chronic kidney disease, stage 3 (moderate): Secondary | ICD-10-CM | POA: Diagnosis not present

## 2014-11-15 LAB — BASIC METABOLIC PANEL
BUN: 10 mg/dL (ref 7–25)
CALCIUM: 9.1 mg/dL (ref 8.6–10.4)
CO2: 25 mmol/L (ref 20–31)
Chloride: 108 mmol/L (ref 98–110)
Creat: 0.88 mg/dL (ref 0.60–0.93)
GLUCOSE: 217 mg/dL — AB (ref 65–99)
Potassium: 4.2 mmol/L (ref 3.5–5.3)
SODIUM: 143 mmol/L (ref 135–146)

## 2014-11-15 NOTE — Telephone Encounter (Signed)
lvm for pt regarding to OCT appt..... °

## 2014-11-15 NOTE — Telephone Encounter (Signed)
I s/w pt tonight about her lab results and to f/u f/u w/PCP about elevated BS. Pt agreeable to plan of care.

## 2014-11-15 NOTE — Telephone Encounter (Signed)
Lmptcb go over lab results

## 2014-11-16 DIAGNOSIS — E1129 Type 2 diabetes mellitus with other diabetic kidney complication: Secondary | ICD-10-CM | POA: Diagnosis not present

## 2014-11-16 DIAGNOSIS — I780 Hereditary hemorrhagic telangiectasia: Secondary | ICD-10-CM | POA: Diagnosis not present

## 2014-11-16 DIAGNOSIS — D638 Anemia in other chronic diseases classified elsewhere: Secondary | ICD-10-CM | POA: Diagnosis not present

## 2014-11-16 DIAGNOSIS — I129 Hypertensive chronic kidney disease with stage 1 through stage 4 chronic kidney disease, or unspecified chronic kidney disease: Secondary | ICD-10-CM | POA: Diagnosis not present

## 2014-11-21 ENCOUNTER — Telehealth: Payer: Self-pay | Admitting: Physician Assistant

## 2014-11-21 NOTE — Telephone Encounter (Signed)
Paged by answering service regarding patient requesting test strip. Attempted to reach patient x 2. However on one had picked up phone.   [name], [place]

## 2014-11-27 NOTE — Progress Notes (Signed)
Cardiology Office Note   Date:  11/28/2014   ID:  JAQUISE FEIN, DOB 10-02-1940, MRN [id number]  PCP:  [name], DO  Cardiologist:  Dr. Fransico Him   Electrophysiologist:  n/a  Chief Complaint  Patient presents with  . Follow-up    Blood Pressure  . Congestive Heart Failure     History of Present Illness: [name] is a 74 y.o. female with a hx of diastolic HF, Hereditary hemorrhagic telangiectasia, gastric AVMs, chronic iron deficiency anemia requiring transfusion, HTN, HL, DM2, GERD.  She was admitted earlier this year with GI bleed and Echo demonstrated normal LVF, mod diastolic dysfunction and severe asymmetric septal hypertrophy.  Last seen by Dr. Fransico Him 7/16.  She was placed on beta-blocker, Amlodipine and Lasix was added to her regimen (BNP elevated).     Admitted in 7/16 with AKI in the setting of UTI.  She was admitted again in 7/16 with a/c anemia in the setting of chronic GI blood loss.  She required transfusion with PRBCs.  She was also admitted in 8/16 with seizures and was placed on Keppra.  Seizures were thought to be provoked by elevated ammonia level and she has been placed on Lactulose with improved NH4 levels.  Recently established with IM clinic and it is suspected the high NH4 levels were related to GI bleeding and constipation.    She has required recent transfusion with PRBCs due to worsening anemia. While at the cancer center, she was noted to have significantly low blood pressures.  I saw her 10/5.  I stopped her enalapril and Norvasc and decreased her Lasix. She returns for follow-up.  She is feeling much better.  She denies significant dyspnea.  She denies chest pain, orthopnea, PND, edema.  She denies syncope.     Studies/Reports Reviewed Today:  Echo 5/16 Mild LVH with moderate to severe asymmetric hypertrophy of the septum, EF 65-70%, normal wall motion, grade 2 diastolic dysfunction, LVOT peak gradient 17 mmHg, MAC, mild MR, severe LAE,  aortic sclerosis, mild TR, no significant SAM   Past Medical History  Diagnosis Date  . Anemia   . S/P endoscopy     with laser treatment  08/19/2006  . History of epistaxis   . Hyperlipidemia   . Telangiectasia     Gastric   . Lichen planus     Both lower extremities  . GERD (gastroesophageal reflux disease)   . Heart murmur   . CHF (congestive heart failure) ([place])   . Type II diabetes mellitus ([name]     insulin requiring.  Marland Kitchen History of blood transfusion "several"  . Chronic diastolic CHF (congestive heart failure) ([place]) 10/03/2013  . HTN (hypertension), benign 03/02/2012  . Family history of anesthesia complication     "niece has a hard time coming out" (09/15/2012)  . Pneumonia 1990's X 2  . Seizures ([place])     "they think she might be having seizures; they are checking her for that now" (10/02/2014)    Past Surgical History  Procedure Laterality Date  . Nose surgery      "for bleeding"   . Savory dilation  02/26/2011    Procedure: SAVORY DILATION;  Surgeon: [name], MD;  Location: [name];  Service: Endoscopy;  Laterality: N/A;  c-arm needed  . Esophagogastroduodenoscopy  02/26/2011    Procedure: ESOPHAGOGASTRODUODENOSCOPY (EGD);  Surgeon: [name], MD;  Location: [name];  Service: Endoscopy;  Laterality: N/A;  . Esophagogastroduodenoscopy N/A 11/08/2012  Procedure: ESOPHAGOGASTRODUODENOSCOPY (EGD);  Surgeon: [name], MD;  Location: [name];  Service: Endoscopy;  Laterality: N/A;  . Cataract extraction      "I think it was just one eye"  . Esophagogastroduodenoscopy N/A 10/04/2013    Procedure: ESOPHAGOGASTRODUODENOSCOPY (EGD);  Surgeon: Winfield Cunas., MD;  Location: [name];  Service: Endoscopy;  Laterality: N/A;  with APC on stand-by  . Esophagogastroduodenoscopy N/A 07/06/2014    Procedure: ESOPHAGOGASTRODUODENOSCOPY (EGD);  Surgeon: Clarene Essex, MD;  Location: [name];  Service: Endoscopy;  Laterality: N/A;  . Hot hemostasis  N/A 07/06/2014    Procedure: HOT HEMOSTASIS (ARGON PLASMA COAGULATION/BICAP);  Surgeon: Clarene Essex, MD;  Location: [name];  Service: Endoscopy;  Laterality: N/A;  . Esophagogastroduodenoscopy N/A 09/05/2014    Procedure: ESOPHAGOGASTRODUODENOSCOPY (EGD);  Surgeon: Laurence Spates, MD;  Location: [name];  Service: Endoscopy;  Laterality: N/A;  APC on standby to control bleeding     Current Outpatient Prescriptions  Medication Sig Dispense Refill  . AMITIZA 24 MCG capsule Take 24 mcg by mouth 2 (two) times daily.    Marland Kitchen amitriptyline (ELAVIL) 25 MG tablet Take 25 mg by mouth daily.    . cyclobenzaprine (FLEXERIL) 5 MG tablet Take 1 tablet (5 mg total) by mouth 3 (three) times daily as needed for muscle spasms. 15 tablet 0  . ferrous fumarate (HEMOCYTE - 106 MG FE) 325 (106 FE) MG TABS tablet Take 1 tablet by mouth daily.    . furosemide (LASIX) 40 MG tablet Take 0.5 tablets (20 mg total) by mouth daily.    . insulin detemir (LEVEMIR) 100 UNIT/ML injection Inject 0.3 mLs (30 Units total) into the skin at bedtime. (Patient taking differently: Inject 30 Units into the skin 2 (two) times daily. ) 10 mL 11  . lactulose (CHRONULAC) 10 GM/15ML solution Take 15 mLs (10 g total) by mouth 2 (two) times daily. 240 mL 0  . levETIRAcetam (KEPPRA) 500 MG tablet Take 1 tablet (500 mg total) by mouth 2 (two) times daily. 60 tablet 6  . metoprolol succinate (TOPROL XL) 25 MG 24 hr tablet Take 0.5 tablets (12.5 mg total) by mouth daily. 30 tablet 11  . oxymetazoline (AFRIN) 0.05 % nasal spray Place 1 spray into both nostrils 2 (two) times daily as needed (Epistaxis). 30 mL 0  . pantoprazole (PROTONIX) 40 MG tablet Take 1 tablet (40 mg total) by mouth 2 (two) times daily. 60 tablet 1  . ONE TOUCH ULTRA TEST test strip 1 each by Other route as needed for other.      No current facility-administered medications for this visit.    Allergies:   Aspirin    Social History:  The patient  reports that she quit  smoking about 43 years ago. Her smoking use included Cigarettes. She quit after 2 years of use. She has never used smokeless tobacco. She reports that she does not drink alcohol or use illicit drugs.   Family History:  The patient's family history includes Breast cancer in an other family member; Stroke in her father. There is no history of Malignant hyperthermia or Seizures.    ROS:   Please see the history of present illness.   Review of Systems  All other systems reviewed and are negative.     PHYSICAL EXAM: VS:  BP 128/42 mmHg  Pulse 73  Ht 5\' 3"  (1.6 m)  Wt 144 lb 1.9 oz (65.372 kg)  BMI 25.54 kg/m2    No data found.  Wt  Readings from Last 3 Encounters:  11/28/14 144 lb 1.9 oz (65.372 kg)  11/14/14 145 lb 12.8 oz (66.134 kg)  11/09/14 145 lb 1.6 oz (65.817 kg)     GEN: Well nourished, well developed, in no acute distress HEENT: normal Neck: no JVD,   no masses Cardiac:  Normal 99991111, RRR; 2/6 systolic murmur RUSB/LSB,  no rubs or gallops, no edema   Respiratory:  clear to auscultation bilaterally, no wheezing, rhonchi or rales. GI: soft, nontender, nondistended, + BS MS: no deformity or atrophy Skin: warm and dry  Neuro:  CNs II-XII intact, Strength and sensation are intact Psych: Normal affect   EKG:  EKG is not ordered today.  It demonstrates:   n/a   Recent Labs: 07/27/2014: Pro B Natriuretic peptide (BNP) 866.0* 08/27/2014: Magnesium 1.7 09/05/2014: TSH 1.238 10/03/2014: B Natriuretic Peptide 100.0 10/31/2014: ALT 22 11/12/2014: HGB 7.3*; Platelets 264 11/14/2014: BUN 10; Creat 0.88; Potassium 4.2; Sodium 143    Lipid Panel    Component Value Date/Time   CHOL  01/19/2009 0630    191        ATP III CLASSIFICATION:  <200     mg/dL   Desirable  200-239  mg/dL   Borderline High  >=240    mg/dL   High          TRIG 184* 01/19/2009 0630   HDL 45 01/19/2009 0630   CHOLHDL 4.2 01/19/2009 0630   VLDL 37 01/19/2009 0630   LDLCALC * 01/19/2009 0630    109         Total Cholesterol/HDL:CHD Risk Coronary Heart Disease Risk Table                     Men   Women  1/2 Average Risk   3.4   3.3  Average Risk       5.0   4.4  2 X Average Risk   9.6   7.1  3 X Average Risk  23.4   11.0        Use the calculated Patient Ratio above and the CHD Risk Table to determine the patient's CHD Risk.        ATP III CLASSIFICATION (LDL):  <100     mg/dL   Optimal  100-129  mg/dL   Near or Above                    Optimal  130-159  mg/dL   Borderline  160-189  mg/dL   High  >190     mg/dL   Very High      ASSESSMENT AND PLAN:  1. HTN:  BP has run low recently and was reportedly 56/37 about 2 weeks ago at the [facility] center.  I saw her and decreased her medications.  BP is improved and she is feeling better.  Continue current Rx.   2. Chronic Diastolic CHF:  Volume appears stable on current diuretic regimen.  Continue current Rx.   3. Hypertrophic Cardiomyopathy:  Prior echo with severe asymmetric septal hypertrophy and no significant LVOT gradient.  Will continue beta-blocker to increase diastolic filling time.   4. Chronic Anemia:  Iron deficiency anemia in the setting of chronic GI blood loss.  She gets frequent transfusions with PRBCs and is followed by hematology.  I have recommended she take Lasix 40 mg on days she gets a transfusion.  5. 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  6. Seizure Disorder:  On Keppra.  FU with Neuro.  7. Hyperammonemia:  Possibly related to GI bleed and constipation.  Controlled by Lactulose.  Managed by IM.    Medication Changes: Current medicines are reviewed at length with the patient today.  Concerns regarding medicines are as outlined above.  The following changes have been made:   Discontinued Medications   No medications on file   Modified Medications   Modified Medication Previous Medication   METOPROLOL SUCCINATE (TOPROL XL) 25 MG 24 HR TABLET metoprolol succinate (TOPROL XL) 25 MG 24 hr tablet      Take  0.5 tablets (12.5 mg total) by mouth daily.    Take 0.5 tablets (12.5 mg total) by mouth daily.   New Prescriptions   No medications on file   Labs/ tests ordered today include:   No orders of the defined types were placed in this encounter.     Disposition:    FU with Dr. Fransico Him 01/2015 as planned.    Signed, Versie Starks, MHS 11/28/2014 3:15 PM    [facility] [address], [place], [place]  [address] Phone: [phone]; Fax: [phone]

## 2014-11-28 ENCOUNTER — Encounter: Payer: Self-pay | Admitting: *Deleted

## 2014-11-28 ENCOUNTER — Telehealth: Payer: Self-pay | Admitting: Oncology

## 2014-11-28 ENCOUNTER — Ambulatory Visit (INDEPENDENT_AMBULATORY_CARE_PROVIDER_SITE_OTHER): Payer: Medicare Other | Admitting: Physician Assistant

## 2014-11-28 ENCOUNTER — Other Ambulatory Visit: Payer: Medicare Other

## 2014-11-28 ENCOUNTER — Encounter: Payer: Self-pay | Admitting: Physician Assistant

## 2014-11-28 VITALS — BP 128/42 | HR 73 | Ht 63.0 in | Wt 144.1 lb

## 2014-11-28 DIAGNOSIS — I422 Other hypertrophic cardiomyopathy: Secondary | ICD-10-CM

## 2014-11-28 DIAGNOSIS — I780 Hereditary hemorrhagic telangiectasia: Secondary | ICD-10-CM

## 2014-11-28 DIAGNOSIS — I10 Essential (primary) hypertension: Secondary | ICD-10-CM

## 2014-11-28 DIAGNOSIS — I5032 Chronic diastolic (congestive) heart failure: Secondary | ICD-10-CM | POA: Diagnosis not present

## 2014-11-28 DIAGNOSIS — D509 Iron deficiency anemia, unspecified: Secondary | ICD-10-CM

## 2014-11-28 MED ORDER — METOPROLOL SUCCINATE ER 25 MG PO TB24: 12.5000 mg | ORAL_TABLET | Freq: Every day | ORAL | Status: DC

## 2014-11-28 NOTE — Telephone Encounter (Signed)
JG PATIENT. PATIENT CALLED TO R/S 10/19 LAB TO 10/20. PATIENT HAS NEW DATE/TIME.

## 2014-11-28 NOTE — Patient Instructions (Signed)
Medication Instructions:  A REFILL FOR METOPROLOL HAS BEEN SENT IN  Labwork: NONE  Testing/Procedures: NONE  Follow-Up: 01/17/15 @ 10:30 DR. TURNER  Any Other Special Instructions Will Be Listed Below (If Applicable).

## 2014-11-29 ENCOUNTER — Inpatient Hospital Stay (HOSPITAL_COMMUNITY)
Admission: EM | Admit: 2014-11-29 | Discharge: 2014-11-30 | DRG: 378 | Disposition: A | Discharge status: Home / Self Care | Payer: Medicare Other | Attending: Internal Medicine | Admitting: Internal Medicine

## 2014-11-29 ENCOUNTER — Telehealth: Payer: Self-pay | Admitting: *Deleted

## 2014-11-29 ENCOUNTER — Encounter (HOSPITAL_COMMUNITY)
Admission: EM | Disposition: A | Discharge status: Home / Self Care | Payer: Self-pay | Source: Home / Self Care | Attending: Internal Medicine

## 2014-11-29 ENCOUNTER — Encounter (HOSPITAL_COMMUNITY): Payer: Self-pay | Admitting: Emergency Medicine

## 2014-11-29 ENCOUNTER — Other Ambulatory Visit: Payer: Medicare Other

## 2014-11-29 DIAGNOSIS — K219 Gastro-esophageal reflux disease without esophagitis: Secondary | ICD-10-CM | POA: Diagnosis present

## 2014-11-29 DIAGNOSIS — D649 Anemia, unspecified: Secondary | ICD-10-CM | POA: Diagnosis not present

## 2014-11-29 DIAGNOSIS — S8991XA Unspecified injury of right lower leg, initial encounter: Secondary | ICD-10-CM | POA: Diagnosis not present

## 2014-11-29 DIAGNOSIS — D509 Iron deficiency anemia, unspecified: Secondary | ICD-10-CM | POA: Diagnosis present

## 2014-11-29 DIAGNOSIS — K31811 Angiodysplasia of stomach and duodenum with bleeding: Secondary | ICD-10-CM | POA: Diagnosis not present

## 2014-11-29 DIAGNOSIS — I780 Hereditary hemorrhagic telangiectasia: Secondary | ICD-10-CM | POA: Diagnosis not present

## 2014-11-29 DIAGNOSIS — K449 Diaphragmatic hernia without obstruction or gangrene: Secondary | ICD-10-CM | POA: Diagnosis present

## 2014-11-29 DIAGNOSIS — I130 Hypertensive heart and chronic kidney disease with heart failure and stage 1 through stage 4 chronic kidney disease, or unspecified chronic kidney disease: Secondary | ICD-10-CM | POA: Diagnosis not present

## 2014-11-29 DIAGNOSIS — Z87891 Personal history of nicotine dependence: Secondary | ICD-10-CM

## 2014-11-29 DIAGNOSIS — E785 Hyperlipidemia, unspecified: Secondary | ICD-10-CM | POA: Diagnosis present

## 2014-11-29 DIAGNOSIS — W19XXXA Unspecified fall, initial encounter: Secondary | ICD-10-CM | POA: Diagnosis present

## 2014-11-29 DIAGNOSIS — E7220 Disorder of urea cycle metabolism, unspecified: Secondary | ICD-10-CM | POA: Diagnosis present

## 2014-11-29 DIAGNOSIS — E1129 Type 2 diabetes mellitus with other diabetic kidney complication: Secondary | ICD-10-CM | POA: Diagnosis present

## 2014-11-29 DIAGNOSIS — I5032 Chronic diastolic (congestive) heart failure: Secondary | ICD-10-CM | POA: Diagnosis present

## 2014-11-29 DIAGNOSIS — R030 Elevated blood-pressure reading, without diagnosis of hypertension: Secondary | ICD-10-CM | POA: Diagnosis not present

## 2014-11-29 DIAGNOSIS — M7989 Other specified soft tissue disorders: Secondary | ICD-10-CM | POA: Diagnosis not present

## 2014-11-29 DIAGNOSIS — E119 Type 2 diabetes mellitus without complications: Secondary | ICD-10-CM | POA: Diagnosis present

## 2014-11-29 DIAGNOSIS — E1165 Type 2 diabetes mellitus with hyperglycemia: Secondary | ICD-10-CM

## 2014-11-29 DIAGNOSIS — M179 Osteoarthritis of knee, unspecified: Secondary | ICD-10-CM | POA: Diagnosis not present

## 2014-11-29 DIAGNOSIS — Z794 Long term (current) use of insulin: Secondary | ICD-10-CM

## 2014-11-29 DIAGNOSIS — G40909 Epilepsy, unspecified, not intractable, without status epilepticus: Secondary | ICD-10-CM | POA: Diagnosis present

## 2014-11-29 DIAGNOSIS — K922 Gastrointestinal hemorrhage, unspecified: Secondary | ICD-10-CM | POA: Diagnosis not present

## 2014-11-29 DIAGNOSIS — Y929 Unspecified place or not applicable: Secondary | ICD-10-CM | POA: Diagnosis not present

## 2014-11-29 DIAGNOSIS — G4700 Insomnia, unspecified: Secondary | ICD-10-CM | POA: Diagnosis not present

## 2014-11-29 DIAGNOSIS — M25562 Pain in left knee: Secondary | ICD-10-CM

## 2014-11-29 DIAGNOSIS — E1122 Type 2 diabetes mellitus with diabetic chronic kidney disease: Secondary | ICD-10-CM | POA: Diagnosis not present

## 2014-11-29 DIAGNOSIS — N183 Chronic kidney disease, stage 3 (moderate): Secondary | ICD-10-CM | POA: Diagnosis present

## 2014-11-29 HISTORY — DX: Anemia, unspecified: D64.9

## 2014-11-29 HISTORY — PX: ESOPHAGOGASTRODUODENOSCOPY: SHX5428

## 2014-11-29 HISTORY — DX: Epistaxis: R04.0

## 2014-11-29 HISTORY — DX: Hereditary hemorrhagic telangiectasia: I78.0

## 2014-11-29 HISTORY — DX: Iron deficiency anemia, unspecified: D50.9

## 2014-11-29 HISTORY — DX: Angiodysplasia of stomach and duodenum without bleeding: K31.819

## 2014-11-29 HISTORY — DX: Gastrointestinal hemorrhage, unspecified: K92.2

## 2014-11-29 LAB — CBC WITH DIFFERENTIAL/PLATELET
BASOS PCT: 0 %
Basophils Absolute: 0 10*3/uL (ref 0.0–0.1)
EOS ABS: 0.2 10*3/uL (ref 0.0–0.7)
Eosinophils Relative: 3 %
HCT: 18.8 % — ABNORMAL LOW (ref 36.0–46.0)
Hemoglobin: 6 g/dL — CL (ref 12.0–15.0)
LYMPHS ABS: 0.7 10*3/uL (ref 0.7–4.0)
Lymphocytes Relative: 10 %
MCH: 27.9 pg (ref 26.0–34.0)
MCHC: 31.9 g/dL (ref 30.0–36.0)
MCV: 87.4 fL (ref 78.0–100.0)
MONO ABS: 0.6 10*3/uL (ref 0.1–1.0)
MONOS PCT: 9 %
Neutro Abs: 5.3 10*3/uL (ref 1.7–7.7)
Neutrophils Relative %: 78 %
PLATELETS: 217 10*3/uL (ref 150–400)
RBC: 2.15 MIL/uL — ABNORMAL LOW (ref 3.87–5.11)
RDW: 17 % — AB (ref 11.5–15.5)
WBC: 6.7 10*3/uL (ref 4.0–10.5)

## 2014-11-29 LAB — COMPREHENSIVE METABOLIC PANEL
ALT: 15 U/L (ref 14–54)
ANION GAP: 7 (ref 5–15)
AST: 22 U/L (ref 15–41)
Albumin: 2.6 g/dL — ABNORMAL LOW (ref 3.5–5.0)
Alkaline Phosphatase: 104 U/L (ref 38–126)
BILIRUBIN TOTAL: 0.4 mg/dL (ref 0.3–1.2)
BUN: 15 mg/dL (ref 6–20)
CO2: 24 mmol/L (ref 22–32)
Calcium: 8.4 mg/dL — ABNORMAL LOW (ref 8.9–10.3)
Chloride: 112 mmol/L — ABNORMAL HIGH (ref 101–111)
Creatinine, Ser: 0.94 mg/dL (ref 0.44–1.00)
GFR, EST NON AFRICAN AMERICAN: 58 mL/min — AB (ref >60)
Glucose, Bld: 195 mg/dL — ABNORMAL HIGH (ref 65–99)
POTASSIUM: 3.8 mmol/L (ref 3.5–5.1)
Sodium: 143 mmol/L (ref 135–145)
TOTAL PROTEIN: 4.9 g/dL — AB (ref 6.5–8.1)

## 2014-11-29 LAB — GLUCOSE, CAPILLARY
GLUCOSE-CAPILLARY: 101 mg/dL — AB (ref 65–99)
GLUCOSE-CAPILLARY: 199 mg/dL — AB (ref 65–99)
Glucose-Capillary: 105 mg/dL — ABNORMAL HIGH (ref 65–99)

## 2014-11-29 LAB — TYPE AND SCREEN
ABO/RH(D): A POS
Antibody Screen: NEGATIVE
Unit division: 0
Unit division: 0

## 2014-11-29 LAB — AMMONIA: Ammonia: 82 umol/L — ABNORMAL HIGH (ref 9–35)

## 2014-11-29 LAB — CBG MONITORING, ED: Glucose-Capillary: 170 mg/dL — ABNORMAL HIGH (ref 65–99)

## 2014-11-29 LAB — VITAMIN B12: VITAMIN B 12: 538 pg/mL (ref 180–914)

## 2014-11-29 LAB — PROTIME-INR
INR: 1.24 (ref 0.00–1.49)
PROTHROMBIN TIME: 15.8 s — AB (ref 11.6–15.2)

## 2014-11-29 LAB — IRON AND TIBC
Iron: 12 ug/dL — ABNORMAL LOW (ref 28–170)
SATURATION RATIOS: 5 % — AB (ref 10.4–31.8)
TIBC: 245 ug/dL — ABNORMAL LOW (ref 250–450)
UIBC: 233 ug/dL

## 2014-11-29 LAB — PREPARE RBC (CROSSMATCH)

## 2014-11-29 LAB — MAGNESIUM: MAGNESIUM: 1.6 mg/dL — AB (ref 1.7–2.4)

## 2014-11-29 LAB — LIPID PANEL
CHOL/HDL RATIO: 4.5 ratio
CHOLESTEROL: 141 mg/dL (ref 0–200)
HDL: 31 mg/dL — ABNORMAL LOW (ref >40)
LDL Cholesterol: 82 mg/dL (ref 0–99)
TRIGLYCERIDES: 141 mg/dL (ref <150)
VLDL: 28 mg/dL (ref 0–40)

## 2014-11-29 LAB — APTT: APTT: 33 s (ref 24–37)

## 2014-11-29 LAB — FOLATE: FOLATE: 29.1 ng/mL (ref >5.9)

## 2014-11-29 LAB — FERRITIN: Ferritin: 10 ng/mL — ABNORMAL LOW (ref 11–307)

## 2014-11-29 SURGERY — EGD (ESOPHAGOGASTRODUODENOSCOPY)
Anesthesia: Moderate Sedation

## 2014-11-29 MED ORDER — SODIUM CHLORIDE 0.9 % IJ SOLN
3.0000 mL | Freq: Two times a day (BID) | INTRAMUSCULAR | Status: DC
  Administered 2014-11-29: 3 mL via INTRAVENOUS

## 2014-11-29 MED ORDER — BUTAMBEN-TETRACAINE-BENZOCAINE 2-2-14 % EX AERO
INHALATION_SPRAY | CUTANEOUS | Status: DC | PRN
Start: 2014-11-29 — End: 2014-11-29
  Administered 2014-11-29: 2 via TOPICAL

## 2014-11-29 MED ORDER — INSULIN ASPART 100 UNIT/ML ~~LOC~~ SOLN
0.0000 [IU] | Freq: Three times a day (TID) | SUBCUTANEOUS | Status: DC
  Administered 2014-11-30: 2 [IU] via SUBCUTANEOUS

## 2014-11-29 MED ORDER — EPINEPHRINE HCL 0.1 MG/ML IJ SOSY
PREFILLED_SYRINGE | INTRAMUSCULAR | Status: AC
  Filled 2014-11-29: qty 10

## 2014-11-29 MED ORDER — SODIUM CHLORIDE 0.9 % IV BOLUS (SEPSIS)
500.0000 mL | Freq: Once | INTRAVENOUS | Status: AC
  Administered 2014-11-29: 500 mL via INTRAVENOUS

## 2014-11-29 MED ORDER — FUROSEMIDE 10 MG/ML IJ SOLN
20.0000 mg | Freq: Once | INTRAMUSCULAR | Status: DC
  Filled 2014-11-29: qty 4

## 2014-11-29 MED ORDER — ACETAMINOPHEN 325 MG PO TABS
650.0000 mg | ORAL_TABLET | Freq: Four times a day (QID) | ORAL | Status: DC | PRN
Start: 2014-11-29 — End: 2014-11-30

## 2014-11-29 MED ORDER — SODIUM CHLORIDE 0.9 % IV SOLN
80.0000 mg | Freq: Once | INTRAVENOUS | Status: DC
  Filled 2014-11-29: qty 80

## 2014-11-29 MED ORDER — SODIUM CHLORIDE 0.9 % IV SOLN
INTRAVENOUS | Status: AC
  Administered 2014-11-29: 07:00:00 via INTRAVENOUS

## 2014-11-29 MED ORDER — LEVETIRACETAM 500 MG PO TABS
500.0000 mg | ORAL_TABLET | Freq: Two times a day (BID) | ORAL | Status: DC
  Administered 2014-11-29 – 2014-11-30 (×3): 500 mg via ORAL
  Filled 2014-11-29 (×5): qty 1

## 2014-11-29 MED ORDER — MIDAZOLAM HCL 10 MG/2ML IJ SOLN
INTRAMUSCULAR | Status: DC | PRN
  Administered 2014-11-29 (×2): 2 mg via INTRAVENOUS
  Administered 2014-11-29: 1 mg via INTRAVENOUS

## 2014-11-29 MED ORDER — PROMETHAZINE HCL 25 MG/ML IJ SOLN: 12.5000 mg | Freq: Four times a day (QID) | INTRAMUSCULAR | Status: DC | PRN

## 2014-11-29 MED ORDER — SODIUM CHLORIDE 0.9 % IV SOLN
Freq: Once | INTRAVENOUS | Status: AC
  Administered 2014-11-29: 250 mL via INTRAVENOUS

## 2014-11-29 MED ORDER — AMITRIPTYLINE HCL 25 MG PO TABS
25.0000 mg | ORAL_TABLET | Freq: Every day | ORAL | Status: DC
  Administered 2014-11-29: 25 mg via ORAL
  Filled 2014-11-29 (×2): qty 1

## 2014-11-29 MED ORDER — MIDAZOLAM HCL 5 MG/ML IJ SOLN
INTRAMUSCULAR | Status: AC
  Filled 2014-11-29: qty 2

## 2014-11-29 MED ORDER — SODIUM CHLORIDE 0.9 % IV SOLN: INTRAVENOUS | Status: DC

## 2014-11-29 MED ORDER — MAGNESIUM SULFATE 2 GM/50ML IV SOLN
2.0000 g | Freq: Once | INTRAVENOUS | Status: AC
  Administered 2014-11-29: 2 g via INTRAVENOUS
  Filled 2014-11-29: qty 50

## 2014-11-29 MED ORDER — ACETAMINOPHEN 650 MG RE SUPP
650.0000 mg | Freq: Four times a day (QID) | RECTAL | Status: DC | PRN
Start: 2014-11-29 — End: 2014-11-30

## 2014-11-29 MED ORDER — SODIUM CHLORIDE 0.9 % IV SOLN: INTRAVENOUS | Status: AC

## 2014-11-29 MED ORDER — AMITRIPTYLINE HCL 25 MG PO TABS
25.0000 mg | ORAL_TABLET | Freq: Every day | ORAL | Status: DC
  Filled 2014-11-29: qty 1

## 2014-11-29 MED ORDER — INSULIN DETEMIR 100 UNIT/ML ~~LOC~~ SOLN
20.0000 [IU] | Freq: Every day | SUBCUTANEOUS | Status: DC
  Administered 2014-11-29: 20 [IU] via SUBCUTANEOUS
  Filled 2014-11-29 (×2): qty 0.2

## 2014-11-29 MED ORDER — FENTANYL CITRATE (PF) 100 MCG/2ML IJ SOLN
INTRAMUSCULAR | Status: DC | PRN
  Administered 2014-11-29 (×3): 25 ug via INTRAVENOUS

## 2014-11-29 MED ORDER — DIPHENHYDRAMINE HCL 50 MG/ML IJ SOLN
INTRAMUSCULAR | Status: AC
  Filled 2014-11-29: qty 1

## 2014-11-29 MED ORDER — FENTANYL CITRATE (PF) 100 MCG/2ML IJ SOLN
INTRAMUSCULAR | Status: AC
  Filled 2014-11-29: qty 2

## 2014-11-29 MED ORDER — SODIUM CHLORIDE 0.9 % IV SOLN: Freq: Once | INTRAVENOUS | Status: DC

## 2014-11-29 MED ORDER — PANTOPRAZOLE SODIUM 40 MG IV SOLR: 40.0000 mg | Freq: Two times a day (BID) | INTRAVENOUS | Status: DC

## 2014-11-29 MED ORDER — SODIUM CHLORIDE 0.9 % IJ SOLN
INTRAMUSCULAR | Status: DC | PRN
  Administered 2014-11-29: 3 mL

## 2014-11-29 MED ORDER — METOPROLOL SUCCINATE 12.5 MG HALF TABLET
12.5000 mg | ORAL_TABLET | Freq: Every day | ORAL | Status: DC
  Administered 2014-11-29: 12.5 mg via ORAL
  Filled 2014-11-29 (×3): qty 1

## 2014-11-29 MED ORDER — PANTOPRAZOLE SODIUM 40 MG IV SOLR
40.0000 mg | Freq: Two times a day (BID) | INTRAVENOUS | Status: DC
  Administered 2014-11-29 – 2014-11-30 (×3): 40 mg via INTRAVENOUS
  Filled 2014-11-29 (×3): qty 40

## 2014-11-29 MED ORDER — SODIUM CHLORIDE 0.9 % IV SOLN
8.0000 mg/h | INTRAVENOUS | Status: DC
  Filled 2014-11-29 (×4): qty 80

## 2014-11-29 NOTE — H&P (Signed)
Date: 11/29/2014               Patient Name:  [name] MRN: [id number]  DOB: 05/10/1940 Age / Sex: 74 y.o., female   PCP: [name], DO         Medical Service: Internal Medicine Teaching Service         Attending Physician: Dr. Sid Falcon, MD    First Contact: Dr. Melburn Hake Pager: [phone]  Second Contact: Dr. Marvel Plan  Pager: [phone]       After Hours (After 5p/  First Contact Pager: [phone]  weekends / holidays): Second Contact Pager: [phone]   Chief Complaint: Blood in vomit   History of Present Illness:   [name] is a very pleasant 74 year old woman with past medical history of Osler-Weber-Rendu disease, gastric AVM, iron deficiency anemia, chronic grade 3 diastolic CHF, hypertension, hyperlipidemia, insulin-dependent Type 2 DM, seizure disorder on keppra, and GERD who presents with hematemesis.  She reports feeling her normal self until about 3 AM this morning where she had nausea followed by a few episodes of hematemesis with significant blood loss (unable to quantify but more than 1 cup). She also had dark BM this morning but normally has dark BM's since she is on chronic oral iron therapy. She reports no abdominal pain but reports her "stomach feeling funny."  She felt weak at the time of hematemesis and could not rise from the bathroom toilet. She denies chest pain, palpitations, lightheadedness, dyspnea, LE edema, or syncope. She denies NSAID, alcohol, aspirin, or AC use. She is compliant with taking protonix twice a day. She has frequent blood and IV iron transfusions (2 pRBC's on 10/3-4) with last ferritin of 24 on 10/3 and Hg of 7.3 with baseline variable from 8-10. Her last EGD on 09/05/14 revealed large gastric AVM that was cauterized. She had recent colonoscopy on 09/09/14 which she reports had a polyp that was removed and was recommended repeat in 5 years. She reports frequent epistaxis with last episode last week. She denies hematuria.   She was found to  have Hg of 6 at [facility] ED. She currently denies any complaints and denies further nausea or  hematemesis.    Meds: Current Facility-Administered Medications  Medication Dose Route Frequency Provider Last Rate Last Dose  . 0.9 %  sodium chloride infusion   Intravenous STAT Charlann Lange, PA-C 100 mL/hr at 11/29/14 0709    . 0.9 %  sodium chloride infusion   Intravenous STAT Shari Upstill, PA-C      . 0.9 %  sodium chloride infusion   Intravenous Once Issabella Rix, MD      . acetaminophen (TYLENOL) tablet 650 mg  650 mg Oral Q6H PRN Juluis Mire, MD       Or  . acetaminophen (TYLENOL) suppository 650 mg  650 mg Rectal Q6H PRN Glenroy Crossen, MD      . amitriptyline (ELAVIL) tablet 25 mg  25 mg Oral Daily Clariza Sickman, MD      . insulin aspart (novoLOG) injection 0-15 Units  0-15 Units Subcutaneous TID WC Renesmay Nesbitt, MD      . insulin detemir (LEVEMIR) injection 20 Units  20 Units Subcutaneous QHS Areli Frary, MD      . levETIRAcetam (KEPPRA) tablet 500 mg  500 mg Oral BID Jessyka Austria, MD      . metoprolol succinate (TOPROL-XL) 24 hr tablet 12.5 mg  12.5 mg Oral Daily Juluis Mire, MD      .  pantoprazole (PROTONIX) 80 mg in sodium chloride 0.9 % 100 mL IVPB  80 mg Intravenous Once Bruk Tumolo, MD      . pantoprazole (PROTONIX) 80 mg in sodium chloride 0.9 % 250 mL (0.32 mg/mL) infusion  8 mg/hr Intravenous Continuous Laren Whaling, MD      . Derrill Memo ON 12/02/2014] pantoprazole (PROTONIX) injection 40 mg  40 mg Intravenous Q12H Jayvien Rowlette, MD      . sodium chloride 0.9 % injection 3 mL  3 mL Intravenous Q12H Juluis Mire, MD        Allergies: Allergies as of 11/29/2014 - Review Complete 11/29/2014  Allergen Reaction Noted  . Aspirin Nausea And Vomiting 05/12/2006   Past Medical History  Diagnosis Date  . Anemia   . S/P endoscopy     with laser treatment  08/19/2006  . History of epistaxis   . Hyperlipidemia   . Telangiectasia     Gastric   . Lichen planus      Both lower extremities  . GERD (gastroesophageal reflux disease)   . Heart murmur   . CHF (congestive heart failure) ([place])   . Type II diabetes mellitus ([name]     insulin requiring.  Marland Kitchen History of blood transfusion "several"  . Chronic diastolic CHF (congestive heart failure) ([place]) 10/03/2013  . HTN (hypertension), benign 03/02/2012  . Family history of anesthesia complication     "niece has a hard time coming out" (09/15/2012)  . Pneumonia 1990's X 2  . Seizures ([place])     "they think she might be having seizures; they are checking her for that now" (10/02/2014)   Past Surgical History  Procedure Laterality Date  . Nose surgery      "for bleeding"   . Savory dilation  02/26/2011    Procedure: SAVORY DILATION;  Surgeon: [name], MD;  Location: [name];  Service: Endoscopy;  Laterality: N/A;  c-arm needed  . Esophagogastroduodenoscopy  02/26/2011    Procedure: ESOPHAGOGASTRODUODENOSCOPY (EGD);  Surgeon: [name], MD;  Location: [name];  Service: Endoscopy;  Laterality: N/A;  . Esophagogastroduodenoscopy N/A 11/08/2012    Procedure: ESOPHAGOGASTRODUODENOSCOPY (EGD);  Surgeon: [name], MD;  Location: [name];  Service: Endoscopy;  Laterality: N/A;  . Cataract extraction      "I think it was just one eye"  . Esophagogastroduodenoscopy N/A 10/04/2013    Procedure: ESOPHAGOGASTRODUODENOSCOPY (EGD);  Surgeon: Winfield Cunas., MD;  Location: [name];  Service: Endoscopy;  Laterality: N/A;  with APC on stand-by  . Esophagogastroduodenoscopy N/A 07/06/2014    Procedure: ESOPHAGOGASTRODUODENOSCOPY (EGD);  Surgeon: Clarene Essex, MD;  Location: [name];  Service: Endoscopy;  Laterality: N/A;  . Hot hemostasis N/A 07/06/2014    Procedure: HOT HEMOSTASIS (ARGON PLASMA COAGULATION/BICAP);  Surgeon: Clarene Essex, MD;  Location: [name];  Service: Endoscopy;  Laterality: N/A;  . Esophagogastroduodenoscopy N/A 09/05/2014    Procedure: ESOPHAGOGASTRODUODENOSCOPY (EGD);   Surgeon: Laurence Spates, MD;  Location: [name];  Service: Endoscopy;  Laterality: N/A;  APC on standby to control bleeding   Family History  Problem Relation Age of Onset  . Breast cancer    . Malignant hyperthermia Neg Hx   . Seizures Neg Hx   . Stroke Father    Social History   Social History  . Marital Status: Married    Spouse Name: [name]  . Number of Children: 0  . Years of Education: 9   Occupational History  . Retired Engineer, manufacturing systems  Social History Main Topics  . Smoking status: Former Smoker -- 2 years    Types: Cigarettes    Quit date: 02/10/1971  . Smokeless tobacco: Never Used     Comment: 09/15/2012 "smoked 50-60 yr ago"  . Alcohol Use: No  . Drug Use: No  . Sexual Activity: No   Other Topics Concern  . Not on file   Social History Narrative   Married, lives with husband, [name].  Ambulates without assistance.     Caffeine use: none    Review of Systems: Pertinent items are noted in HPI.   Physical Exam: Blood pressure 114/39, pulse 73, temperature 98 F (36.7 C), temperature source Oral, resp. rate 17, height 5\' 3"  (1.6 m), weight 144 lb (65.318 kg), SpO2 100 %.   General: NAD HEENT: Normocephalic, EOMI, no oral lesions Heart: Normal rate and rhythm. 2/6 systolic mumur present. Port present on right upper chest. Lungs: Clear to ausculation bilaterally with no ronchi, wheezing, or rales  Abdomen: Soft, non-tender, non-distended, normal BS Extremities: Bilateral lichen planus on LE, no edema Neuro: A & O x 3, no focal deficits   Psych: Normal mood and behavior, very pleasant  Lab results: Basic Metabolic Panel:  Recent Labs  11/29/14 0449  NA 143  K 3.8  CL 112*  CO2 24  GLUCOSE 195*  BUN 15  CREATININE 0.94  CALCIUM 8.4*   Liver Function Tests:  Recent Labs  11/29/14 0449  AST 22  ALT 15  ALKPHOS 104  BILITOT 0.4  PROT 4.9*  ALBUMIN 2.6*    Recent Labs  11/29/14 0449  AMMONIA 82*   CBC:  Recent Labs   11/29/14 0449  WBC 6.7  NEUTROABS 5.3  HGB 6.0*  HCT 18.8*  MCV 87.4  PLT 217   CBG:  Recent Labs  11/29/14 0727  GLUCAP 170*   Coagulation:  Recent Labs  11/29/14 0449  LABPROT 15.8*  INR 1.24   Urine Drug Screen: Drugs of Abuse     Component Value Date/Time   LABOPIA NONE DETECTED 10/02/2014 2307   COCAINSCRNUR NONE DETECTED 10/02/2014 2307   LABBENZ NONE DETECTED 10/02/2014 2307   AMPHETMU NONE DETECTED 10/02/2014 2307   THCU NONE DETECTED 10/02/2014 2307   LABBARB NONE DETECTED 10/02/2014 2307       Other results: EKG: none available   Assessment & Plan by Problem:  Symptomatic anemia from probable bleeding gastric AVM in setting of Osler-Weber-Rendu disease - Pt with significant hematemesis this AM and weakness found to have Hg 6 below baseline 8-10. Pt no longer actively bleeding and hemodynamically stable at this time. Last EGD on 09/05/14 with large gastric AVM that was cauterized. Etiology most like due to rebleeding gastric AVM. -Admit to SDU and keep NPO -Start IV protonix infusion  -Continue NS 100 mL/hr x 12 hrs  -Obtain PT/ PTT and anemia panel before transfusion  -Transfuse 2 units of PRBC and monitor CBC with goal Hg >7 -Consult GI for EGD   -Start phenergan 12.5 mg Q 6 hr PRN nausea/vomiting  -Consider IV feraheme transfusion before discharge  Hypertension - Currently normotensive  -Resume home toprol-XL 12.5 mg daily -Hold home lasix 20 mg daily   Insulin-dependent Type 2 DM  - CBG 170 this AM. Last A1c 7.3 on 09/05/14. Pt at home on levemir 30 U BID.  -Start moderate SSI with meals and levemir 20 U BID (at home on 30 U BID) -Monitor CBG's before meals and at bedtime  -Obtain lipid  panel   Seizure disorder - Last seizure in August of 2016.  -Resume home keppra 500 mg BID -Obtain keppra level in setting of hyperammonemia   Hyperammoniemia - Ammonia level at 82 which has been elevated since August of 2016 which was 327. Pt currently at  normal baseline mental status.  Etiology unclear as pt has no documented liver disease. [place] in 2008 with no liver disease. Congential cause due genetic defect is possible.  Pt is on AED which can raise level as well.  -Consider US abdomen to assess for liver disease -Obtain HIV and acute hepatitis panel  -Consider discounting home lactulose  -Obtain keppra level   GERD - Pt with hematemesis this morning in setting of probable bleeding gastric AVM. Pt reports compliance with home protonix 40 mg BID. -Start IV protonix infusion   Insomnia - Currently stable.  -Resume home elavil 25 mg daily at bedtime   Diet: NPO DVT Ppx: SCD's  Code: Full   Dispo: Disposition is deferred at this time, awaiting improvement of current medical problems. Anticipated discharge in approximately 1-2 day(s).   The patient does have a current PCP [name], DO) and does need an [facility] hospital follow-up appointment after discharge.  The patient does not have transportation limitations that hinder transportation to clinic appointments.  Signed: Juluis Mire, MD 11/29/2014, 9:48 AM

## 2014-11-29 NOTE — Interval H&P Note (Signed)
History and Physical Interval Note:  11/29/2014 2:43 PM  [place]  has presented today for surgery, with the diagnosis of GI bleed  The various methods of treatment have been discussed with the patient and family. After consideration of risks, benefits and other options for treatment, the patient has consented to  Procedure(s): ESOPHAGOGASTRODUODENOSCOPY (EGD) (N/A) as a surgical intervention .  The patient's history has been reviewed, patient examined, no change in status, stable for surgery.  I have reviewed the patient's chart and labs.  Questions were answered to the patient's satisfaction.     [place] C.

## 2014-11-29 NOTE — ED Notes (Signed)
Pt has signed blood transfusion consent and it is at pt bedside.

## 2014-11-29 NOTE — ED Provider Notes (Signed)
Patient has a history of GI bleeds from AV malformation secondary to Osler-Weber- Wrenn syndrome and requires blood transfusions. She reports she had 2 episodes of rectal bleeding last night. She denies feeling weak or dizzy. However she was so weak that she was still lying on the bathroom floor when EMS arrived. She denies any abdominal pain.  Patient is well-developed well-nourished, she is alert and oriented. Her abdomen is nontender to palpation.  Medical screening examination/treatment/procedure(s) were conducted as a shared visit with non-physician practitioner(s) and myself.  I personally evaluated the patient during the encounter.   EKG Interpretation None       Rolland Porter, MD, Barbette Or, MD 11/29/14 [phone]

## 2014-11-29 NOTE — Op Note (Signed)
[facility] [place], [id number]   ENDOSCOPY PROCEDURE REPORT  PATIENT: [name], [name]  MR#: [id number] BIRTHDATE: 1940/12/08 , 74  yrs. old GENDER: female ENDOSCOPIST: [name], MD REFERRED BY: PROCEDURE DATE:  23-Dec-2014 PROCEDURE:  EGD w/ ablation ASA CLASS:     Class III INDICATIONS:  hematemesis. MEDICATIONS: Fentanyl 75 mcg IV, Versed 5 mg IV; 3 cc [id number] 1:10,000 U TOPICAL ANESTHETIC: Cetacaine Spray  DESCRIPTION OF PROCEDURE: After the risks benefits and alternatives of the procedure were thoroughly explained, informed consent was obtained.  The [id number] ([id number]) endoscope was introduced through the mouth and advanced to the second portion of the duodenum , Without limitations.  The instrument was slowly withdrawn as the mucosa was fully examined. Estimated blood loss is zero unless otherwise noted in this procedure report.    Esophagus normal. GEJ normal. Small gastric AVM (bleeding stigmata) on greater curvature with small amount of red blood products in proximal stomach. Examined duodenum normal.      Retroflexed views revealed a hiatal hernia and previously place hemoclip noted. AVM ablated with APC and 3 cc of [id number] mixture injected submucosally.     The scope was then withdrawn from the patient and the procedure completed.  COMPLICATIONS: There were no immediate complications.  ENDOSCOPIC IMPRESSION:     Gastric AVM bleed - s/p APC (see above) Hiatal Hernia  RECOMMENDATIONS:     Follow H/Hs; Clear liquid diet   eSigned:  [name], MD December 23, 2014 2:58 PM    CC:  CPT CODES: ICD CODES:  The ICD and CPT codes recommended by this software are interpretations from the data that the clinical staff has captured with the software.  The verification of the translation of this report to the ICD and CPT codes and modifiers is the sole responsibility of the health care institution and  practicing physician where this report was generated.  [facility]. will not be held responsible for the validity of the ICD and CPT codes included on this report.  AMA assumes no liability for data contained or not contained herein. CPT is a Designer, television/film set of the [employer].  PATIENT NAME:  [name], Frangipane MR#: [id number]

## 2014-11-29 NOTE — Telephone Encounter (Signed)
FYI - Call from the [facility] - stated pt supposed to have had blood work this morning but she's in the hospital for GI bleed.

## 2014-11-29 NOTE — ED Notes (Signed)
[facility] updated that bed assignment has been cancelled, will call back to arrange pick up when new bed assignment becomes available.

## 2014-11-29 NOTE — ED Notes (Signed)
Attempt to call report x 1  

## 2014-11-29 NOTE — ED Notes (Signed)
Pt from home via EMS. Pt reports she has had "many" blood transfusions. The last of which was a few weeks ago. Pt states she has had blood in both her emesis and her stool. Per EMS she vomited bright red blood when they arrived to pick her up. Her blood pressure was 70/30 when EMS arrived on the scene, but after they established an IV and gave her part of a bag of fluids it had improved to 98/38. Pt had vomited 1 time today and reports blood in her stool 2 times today.

## 2014-11-29 NOTE — ED Notes (Signed)
Attempt to call report x 1 to [facility]

## 2014-11-29 NOTE — Progress Notes (Signed)
Initial Nutrition Assessment  DOCUMENTATION CODES:   Not applicable  INTERVENTION:   Advance diet as medically appropriate, RD to add interventions accordingly  NUTRITION DIAGNOSIS:   Inadequate oral intake related to inability to eat as evidenced by NPO status  GOAL:   Patient will meet greater than or equal to 90% of their needs  MONITOR:   Diet advancement, PO intake, Labs, Weight trends, I & O's  REASON FOR ASSESSMENT:   Consult Assessment of nutrition requirement/status  ASSESSMENT:   74 yo Female with PMH of HTN, dyslipidemia, DM, chronic diastolic congestive heart failure, history of GI bleed secondary to AVMs secondary to Osler-Weber-Rend syndrome, chronic kidney disease stage III. She had a normal routine day yesterday but woke this morning to go to the bathroom. She had a single large bloody bowel movement with subsequent vomiting x 2 of bloody emesis.  Patient reports she was eating well prior to her "sick episode".  No weight loss reported.  Drinks Glucerna Shake at home.  Dx is probable GI bleed, anemia.  GI consulted for EGD.  Nutrition focused physical exam completed.  No muscle or subcutaneous fat depletion noticed.  Diet Order:  Diet NPO time specified  Skin:  Reviewed, no issues  Last BM:  N/A  Height:   Ht Readings from Last 1 Encounters:  11/29/14 5\' 3"  (1.6 m)    Weight:   Wt Readings from Last 1 Encounters:  11/29/14 144 lb (65.318 kg)    Ideal Body Weight:  52.2 kg  BMI:  Body mass index is 25.51 kg/(m^2).  Estimated Nutritional Needs:   Kcal:  1600-1800  Protein:  70-80 gm  Fluid:  1.6-1.8 L  EDUCATION NEEDS:   No education needs identified at this time  Arthur Holms, RD, LDN Pager #: [phone] After-Hours Pager #: [phone]

## 2014-11-29 NOTE — ED Notes (Signed)
Bed: [id number] Expected date:  Expected time:  Means of arrival:  Comments: 67F coffee ground emesis

## 2014-11-29 NOTE — Progress Notes (Signed)
To  Endo via stretcher. There were no orders put in for consent.

## 2014-11-29 NOTE — Progress Notes (Signed)
Transferred  From [facility]. Oriented to room. CHG bath completed. VSS.

## 2014-11-29 NOTE — Discharge Summary (Signed)
Name: [name] MRN: [id number] DOB: [birth year] 74 y.o. PCP: [name], DO  Date of Admission: 11/29/2014  4:09 AM Date of Discharge: 11/29/2014 Attending Physician: Sid Falcon, MD  Discharge Diagnosis:  1. 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 Seizure disorder 3. Hypertension 4. Insulin-dependent type 2 diabetes 5. Soft tissue knee injury  Discharge Medications:   Medication List    STOP taking these medications        cyclobenzaprine 5 MG tablet  Commonly known as:  FLEXERIL      TAKE these medications        AMITIZA 24 MCG capsule  Generic drug:  lubiprostone  Take 24 mcg by mouth 2 (two) times daily.     amitriptyline 25 MG tablet  Commonly known as:  ELAVIL  Take 25 mg by mouth daily.     ferrous fumarate 325 (106 FE) MG Tabs tablet  Commonly known as:  HEMOCYTE - 106 mg FE  Take 1 tablet by mouth daily.     furosemide 40 MG tablet  Commonly known as:  LASIX  Take 0.5 tablets (20 mg total) by mouth daily.     insulin detemir 100 UNIT/ML injection  Commonly known as:  LEVEMIR  Inject 0.3 mLs (30 Units total) into the skin at bedtime.     lactulose 10 GM/15ML solution  Commonly known as:  CHRONULAC  Take 15 mLs (10 g total) by mouth 2 (two) times daily.     levETIRAcetam 500 MG tablet  Commonly known as:  KEPPRA  Take 1 tablet (500 mg total) by mouth 2 (two) times daily.     metoprolol succinate 25 MG 24 hr tablet  Commonly known as:  TOPROL XL  Take 0.5 tablets (12.5 mg total) by mouth daily.     ONE TOUCH ULTRA TEST test strip  Generic drug:  glucose blood  1 each by Other route as needed for other.     oxymetazoline 0.05 % nasal spray  Commonly known as:  AFRIN  Place 1 spray into both nostrils 2 (two) times daily as needed (Epistaxis).     pantoprazole 40 MG tablet  Commonly known as:  PROTONIX  Take 1 tablet (40 mg total) by mouth 2 (two) times daily.       traMADol 50 MG tablet  Commonly known as:  ULTRAM  Take 1 tablet (50 mg total) by mouth every 6 (six) hours as needed.       Disposition and follow-up:   [name] was discharged from [facility] in Good condition.  At the hospital follow up visit please address:  Resolution of her GI bleed Check a CBC and consider iron infusion Consider down-titrating her metoprolol or discontinuing it all together Resolution of her knee pain  Consultations:  Gastroenterology  Procedures: CLINICAL DATA: Discomfort and swelling left knee. No injury.  EXAM: PORTABLE LEFT KNEE - 1-2 VIEW  COMPARISON: 06/18/2004  FINDINGS: Evidence of mild tricompartmental osteoarthritic change most prominent over the lateral compartment with slight progression. Possible small joint effusion no fracture or dislocation.  IMPRESSION: No acute findings.  Mild osteoarthritic change most prominent over the lateral compartment. Possible small joint effusion.   Electronically Signed  By: Marin Olp M.D.  On: 11/30/2014 14:53  Admission HPI: [name] is a very pleasant 74 year old woman with past medical history of Osler-Weber-Rendu disease, gastric AVM, iron deficiency anemia, chronic grade 3 diastolic CHF, hypertension, hyperlipidemia,  insulin-dependent Type 2 DM, seizure disorder on keppra, and GERD who presents with hematemesis.  She reports feeling her normal self until about 3 AM this morning where she had nausea followed by a few episodes of hematemesis with significant blood loss (unable to quantify but more than 1 cup). She also had dark BM this morning but normally has dark BM's since she is on chronic oral iron therapy. She reports no abdominal pain but reports her "stomach feeling funny."  She felt weak at the time of hematemesis and could not rise from the bathroom toilet. She denies chest pain, palpitations, lightheadedness, dyspnea, LE edema, or  syncope. She denies NSAID, alcohol, aspirin, or AC use. She is compliant with taking protonix twice a day. She has frequent blood and IV iron transfusions (2 pRBC's on 10/3-4) with last ferritin of 24 on 10/3 and Hg of 7.3 with baseline variable from 8-10. Her last EGD on 09/05/14 revealed large gastric AVM that was cauterized. She had recent colonoscopy on 09/09/14 which she reports had a polyp that was removed and was recommended repeat in 5 years. She reports frequent epistaxis with last episode last week. She denies hematuria.   She was found to have Hg of 6 at [facility] ED. She currently denies any complaints and denies further nausea or  hematemesis.  Hospital Course by problem list:  1. 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 She was hemodynamically stable and not actively bleeding upon arrival. She was started on IV protonix and underwent endoscopy with Gastroenterology where gastric AVM was sclerosed. She received 2U PRBCs and her hemoglobin rose to 9.3. Her hemoglobin remained stable the following day and she was discharged home with return precautions.  2. Seizure disorder: Her last seizure was in August of 2016. She did not have a seizure while inpatient. We resumed her home dose of Keppra 500mg  BID.  3. Hypertension: She was normotensive. We continued her home medications per above.  4. Insulin-dependent type 2 diabetes: Her last a1c was 7.3 in July 2016. She is on 30U levemir BID. Her sugars were well-controlled while inpatient.  5. Soft tissue knee injury: She had a small effusion on her left knee after falling 2 days ago. There was no overlying warmth or erythema, and no signs of a septic joint. An X-ray showed no fracture. She was sent home with Tramadol for her pain.  Discharge Vitals:   BP 128/44 mmHg  Pulse 58   Temp(Src) 98 F (36.7 C) (Oral)  Resp 17  Ht 5\' 3"  (1.6 m)  Wt 65.227 kg (143 lb 12.8 oz)  BMI 25.48 kg/m2  SpO2 97%  Discharge Labs:  Results for orders placed or performed during the hospital encounter of 11/29/14 (from the past 24 hour(s))  Vitamin B12     Status: None   Collection Time: 11/29/14  4:07 PM  Result Value Ref Range   Vitamin B-12 538 180 - 914 pg/mL  Iron and TIBC     Status: Abnormal   Collection Time: 11/29/14  4:07 PM  Result Value Ref Range   Iron 12 (L) 28 - 170 ug/dL   TIBC 245 (L) 250 - 450 ug/dL   Saturation Ratios 5 (L) 10.4 - 31.8 %   UIBC 233 ug/dL  Ferritin     Status: Abnormal   Collection Time: 11/29/14  4:07 PM  Result Value Ref Range   Ferritin 10 (L) 11 - 307 ng/mL  Folate     Status: None   Collection Time: 11/29/14  4:07 PM  Result Value Ref Range   Folate 29.1 >5.9 ng/mL  Magnesium     Status: Abnormal   Collection Time: 11/29/14  4:07 PM  Result Value Ref Range   Magnesium 1.6 (L) 1.7 - 2.4 mg/dL  APTT     Status: None   Collection Time: 11/29/14  4:07 PM  Result Value Ref Range   aPTT 33 24 - 37 seconds  Type and screen [place]     Status: None   Collection Time: 11/29/14  5:01 PM  Result Value Ref Range   ABO/RH(D) A POS    Antibody Screen NEG    Sample Expiration 12/02/2014    Unit Number [id number]    Blood Component Type RED CELLS,LR    Unit division 00    Status of Unit ISSUED,FINAL    Transfusion Status OK TO TRANSFUSE    Crossmatch Result Compatible    Unit Number [id number]    Blood Component Type RED CELLS,LR    Unit division 00    Status of Unit ISSUED,FINAL    Transfusion Status OK TO TRANSFUSE    Crossmatch Result Compatible   Prepare RBC     Status: None   Collection Time: 11/29/14  5:30 PM  Result Value Ref Range   Order Confirmation ORDER PROCESSED BY BLOOD BANK   Glucose, capillary     Status: Abnormal   Collection Time: 11/29/14  8:43 PM  Result Value Ref Range    Glucose-Capillary 199 (H) 65 - 99 mg/dL  CBC     Status: Abnormal   Collection Time: 11/30/14  5:37 AM  Result Value Ref Range   WBC 5.6 4.0 - 10.5 K/uL   RBC 2.96 (L) 3.87 - 5.11 MIL/uL   Hemoglobin 8.3 (L) 12.0 - 15.0 g/dL   HCT 25.9 (L) 36.0 - 46.0 %   MCV 87.5 78.0 - 100.0 fL   MCH 28.0 26.0 - 34.0 pg   MCHC 32.0 30.0 - 36.0 g/dL   RDW 15.9 (H) 11.5 - 15.5 %   Platelets 182 150 - 400 K/uL  Basic metabolic panel     Status: Abnormal   Collection Time: 11/30/14  5:37 AM  Result Value Ref Range   Sodium 143 135 - 145 mmol/L   Potassium 3.9 3.5 - 5.1 mmol/L   Chloride 110 101 - 111 mmol/L   CO2 26 22 - 32 mmol/L   Glucose, Bld 127 (H) 65 - 99 mg/dL   BUN 9 6 - 20 mg/dL   Creatinine, Ser 0.80 0.44 - 1.00 mg/dL   Calcium 8.4 (L) 8.9 - 10.3 mg/dL   GFR calc non Af Amer >60 >60 mL/min   GFR calc Af Amer >60 >60 mL/min   Anion gap 7 5 - 15  Glucose, capillary     Status: None   Collection Time: 11/30/14  7:54 AM  Result Value Ref Range   Glucose-Capillary 90 65 - 99 mg/dL  Glucose, capillary     Status: Abnormal   Collection Time: 11/30/14 12:28 PM  Result Value Ref Range   Glucose-Capillary 124 (H) 65 - 99 mg/dL  CBC     Status: Abnormal   Collection Time: 11/30/14 12:30 PM  Result Value Ref Range   WBC 5.7 4.0 - 10.5 K/uL   RBC 3.00 (L) 3.87 -  5.11 MIL/uL   Hemoglobin 8.5 (L) 12.0 - 15.0 g/dL   HCT 26.4 (L) 36.0 - 46.0 %   MCV 88.0 78.0 - 100.0 fL   MCH 28.3 26.0 - 34.0 pg   MCHC 32.2 30.0 - 36.0 g/dL   RDW 16.2 (H) 11.5 - 15.5 %   Platelets 182 150 - 400 K/uL    Signed: [name], MD 11/30/2014, 1:18 PM

## 2014-11-29 NOTE — Consult Note (Signed)
Referring Provider: Dr. Daryll Drown Primary Care Physician:  [name], DO Primary Gastroenterologist:  Althia Forts  Reason for Consultation:  Hematemesis  HPI: [name] is a 74 y.o. 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 She has had several episodes of hematemesis without associated abdominal pain, hematochezia. Has chronic dark stools. Denies NSAIDs. Colonoscopy July 2016 with a polyp removed. Hgb 7.3. Denies chest pain, shortness of breath.   Past Medical History  Diagnosis Date  . Anemia   . S/P endoscopy     with laser treatment  08/19/2006  . History of epistaxis   . Hyperlipidemia   . Telangiectasia     Gastric   . Lichen planus     Both lower extremities  . GERD (gastroesophageal reflux disease)   . Heart murmur   . CHF (congestive heart failure) ([place])   . Type II diabetes mellitus ([name]     insulin requiring.  Marland Kitchen History of blood transfusion "several"  . Chronic diastolic CHF (congestive heart failure) ([place]) 10/03/2013  . HTN (hypertension), benign 03/02/2012  . Family history of anesthesia complication     "niece has a hard time coming out" (09/15/2012)  . Pneumonia 1990's X 2  . Seizures ([place])     "they think she might be having seizures; they are checking her for that now" (10/02/2014)    Past Surgical History  Procedure Laterality Date  . Nose surgery      "for bleeding"   . Savory dilation  02/26/2011    Procedure: SAVORY DILATION;  Surgeon: [name], MD;  Location: [name];  Service: Endoscopy;  Laterality: N/A;  c-arm needed  . Esophagogastroduodenoscopy  02/26/2011    Procedure: ESOPHAGOGASTRODUODENOSCOPY (EGD);  Surgeon: [name], MD;  Location: [name];  Service: Endoscopy;  Laterality: N/A;  . Esophagogastroduodenoscopy N/A 11/08/2012    Procedure: ESOPHAGOGASTRODUODENOSCOPY (EGD);  Surgeon: [name], MD;  Location: [name];  Service: Endoscopy;  Laterality: N/A;  . Cataract  extraction      "I think it was just one eye"  . Esophagogastroduodenoscopy N/A 10/04/2013    Procedure: ESOPHAGOGASTRODUODENOSCOPY (EGD);  Surgeon: Winfield Cunas., MD;  Location: [name];  Service: Endoscopy;  Laterality: N/A;  with APC on stand-by  . Esophagogastroduodenoscopy N/A 07/06/2014    Procedure: ESOPHAGOGASTRODUODENOSCOPY (EGD);  Surgeon: Clarene Essex, MD;  Location: [name];  Service: Endoscopy;  Laterality: N/A;  . Hot hemostasis N/A 07/06/2014    Procedure: HOT HEMOSTASIS (ARGON PLASMA COAGULATION/BICAP);  Surgeon: Clarene Essex, MD;  Location: [name];  Service: Endoscopy;  Laterality: N/A;  . Esophagogastroduodenoscopy N/A 09/05/2014    Procedure: ESOPHAGOGASTRODUODENOSCOPY (EGD);  Surgeon: Laurence Spates, MD;  Location: [name];  Service: Endoscopy;  Laterality: N/A;  APC on standby to control bleeding    Prior to Admission medications   Medication Sig Start Date End Date Taking? Authorizing Provider  AMITIZA 24 MCG capsule Take 24 mcg by mouth 2 (two) times daily. 08/29/14  Yes Historical Provider, MD  amitriptyline (ELAVIL) 25 MG tablet Take 25 mg by mouth daily. 10/12/14  Yes Historical Provider, MD  ferrous fumarate (HEMOCYTE - 106 MG FE) 325 (106 FE) MG TABS tablet Take 1 tablet by mouth daily.   Yes Historical Provider, MD  furosemide (LASIX) 40 MG tablet Take 0.5 tablets (20 mg total) by mouth daily. 11/14/14  Yes Liliane Shi, PA-C  insulin detemir (LEVEMIR) 100 UNIT/ML injection Inject 0.3 mLs (30 Units  total) into the skin at bedtime. Patient taking differently: Inject 30 Units into the skin 2 (two) times daily.  10/05/13  Yes Robbie Lis, MD  lactulose (CHRONULAC) 10 GM/15ML solution Take 15 mLs (10 g total) by mouth 2 (two) times daily. 10/31/14  Yes Evelina Bucy, MD  levETIRAcetam (KEPPRA) 500 MG tablet Take 1 tablet (500 mg total) by mouth 2 (two) times daily. 10/18/14  Yes Melvenia Beam, MD  metoprolol succinate (TOPROL XL) 25 MG 24 hr tablet Take  0.5 tablets (12.5 mg total) by mouth daily. 11/28/14  Yes Liliane Shi, PA-C  oxymetazoline (AFRIN) 0.05 % nasal spray Place 1 spray into both nostrils 2 (two) times daily as needed (Epistaxis). 10/05/13  Yes Robbie Lis, MD  pantoprazole (PROTONIX) 40 MG tablet Take 1 tablet (40 mg total) by mouth 2 (two) times daily. 07/07/14  Yes Hosie Poisson, MD  cyclobenzaprine (FLEXERIL) 5 MG tablet Take 1 tablet (5 mg total) by mouth 3 (three) times daily as needed for muscle spasms. Patient not taking: Reported on 11/29/2014 08/28/14   Robbie Lis, MD  ONE TOUCH ULTRA TEST test strip 1 each by Other route as needed for other.  11/21/14   Historical Provider, MD    Scheduled Meds: . sodium chloride   Intravenous STAT  . sodium chloride   Intravenous STAT  . sodium chloride   Intravenous Once  . amitriptyline  25 mg Oral QHS  . insulin aspart  0-15 Units Subcutaneous TID WC  . insulin detemir  20 Units Subcutaneous QHS  . levETIRAcetam  500 mg Oral BID  . metoprolol succinate  12.5 mg Oral Daily  . pantoprazole (PROTONIX) IV  80 mg Intravenous Once  . [START ON 12/02/2014] pantoprazole (PROTONIX) IV  40 mg Intravenous Q12H  . sodium chloride  3 mL Intravenous Q12H   Continuous Infusions: . sodium chloride    . pantoprozole (PROTONIX) infusion     PRN Meds:.acetaminophen **OR** acetaminophen, butamben-tetracaine-benzocaine, promethazine  Allergies as of 11/29/2014 - Review Complete 11/29/2014  Allergen Reaction Noted  . Aspirin Nausea And Vomiting 05/12/2006    Family History  Problem Relation Age of Onset  . Breast cancer    . Malignant hyperthermia Neg Hx   . Seizures Neg Hx   . Stroke Father     Social History   Social History  . Marital Status: Married    Spouse Name: [name]  . Number of Children: 0  . Years of Education: 9   Occupational History  . Retired Engineer, manufacturing systems    Social History Main Topics  . Smoking status: Former Smoker -- 2 years    Types: Cigarettes     Quit date: 02/10/1971  . Smokeless tobacco: Never Used     Comment: 09/15/2012 "smoked 50-60 yr ago"  . Alcohol Use: No  . Drug Use: No  . Sexual Activity: No   Other Topics Concern  . Not on file   Social History Narrative   Married, lives with husband, [name].  Ambulates without assistance.     Caffeine use: none    Review of Systems: All negative except as stated above in HPI.  Physical Exam: Vital signs: Filed Vitals:   11/29/14 1352  BP: 144/48  Pulse: 70  Temp: 99.3  Resp: 17     General:   Alert,  Well-developed, well-nourished, pleasant and cooperative in NAD, elderly HEENT: anicteric Lungs:  Clear throughout to auscultation.   No wheezes, crackles, or rhonchi. No acute  distress. Heart:  Regular rate and rhythm; no murmurs, clicks, rubs,  or gallops. Abdomen: soft, nontender, nondistended, +BS  Rectal:  Deferred Ext: no edema Skin: no jaundice  GI:  Lab Results:  Recent Labs  11/29/14 0449  WBC 6.7  HGB 6.0*  HCT 18.8*  PLT 217   BMET  Recent Labs  11/29/14 0449  NA 143  K 3.8  CL 112*  CO2 24  GLUCOSE 195*  BUN 15  CREATININE 0.94  CALCIUM 8.4*   LFT  Recent Labs  11/29/14 0449  PROT 4.9*  ALBUMIN 2.6*  AST 22  ALT 15  ALKPHOS 104  BILITOT 0.4   PT/INR  Recent Labs  11/29/14 0449  LABPROT 15.8*  INR 1.24     Studies/Results: No results found.  Impression/Plan: 74 yo with hematemesis and history of gastric AVMs in need of an EGD to further evaluate. NPO. Supportive care. Protonix drip.    LOS: 0 days   [place] C.  11/29/2014, 1:56 PM  Pager [phone]  If no answer or after 5 PM call [phone]

## 2014-11-29 NOTE — ED Notes (Signed)
Report called to Kathlee Nations at [facility]

## 2014-11-29 NOTE — ED Notes (Signed)
Report called to [facility]. Pt vital signs reported to [facility]. Pt is comfortably resting without complaints of pain or discomfort at this time.

## 2014-11-29 NOTE — ED Notes (Signed)
Critical HGB 6.0 Read back and verified Primary Nurse and Provider notified

## 2014-11-29 NOTE — H&P (View-Only) (Signed)
Referring Provider: Dr. Daryll Drown Primary Care Physician:  [name], DO Primary Gastroenterologist:  Althia Forts  Reason for Consultation:  Hematemesis  HPI: [name] is a 74 y.o. 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 She has had several episodes of hematemesis without associated abdominal pain, hematochezia. Has chronic dark stools. Denies NSAIDs. Colonoscopy July 2016 with a polyp removed. Hgb 7.3. Denies chest pain, shortness of breath.   Past Medical History  Diagnosis Date  . Anemia   . S/P endoscopy     with laser treatment  08/19/2006  . History of epistaxis   . Hyperlipidemia   . Telangiectasia     Gastric   . Lichen planus     Both lower extremities  . GERD (gastroesophageal reflux disease)   . Heart murmur   . CHF (congestive heart failure) ([name])   . Type II diabetes mellitus ([name]     insulin requiring.  Marland Kitchen History of blood transfusion "several"  . Chronic diastolic CHF (congestive heart failure) ([name]) 10/03/2013  . HTN (hypertension), benign 03/02/2012  . Family history of anesthesia complication     "niece has a hard time coming out" (09/15/2012)  . Pneumonia 1990's X 2  . Seizures ([name])     "they think she might be having seizures; they are checking her for that now" (10/02/2014)    Past Surgical History  Procedure Laterality Date  . Nose surgery      "for bleeding"   . Savory dilation  02/26/2011    Procedure: SAVORY DILATION;  Surgeon: [name], MD;  Location: [name];  Service: Endoscopy;  Laterality: N/A;  c-arm needed  . Esophagogastroduodenoscopy  02/26/2011    Procedure: ESOPHAGOGASTRODUODENOSCOPY (EGD);  Surgeon: [name], MD;  Location: [name];  Service: Endoscopy;  Laterality: N/A;  . Esophagogastroduodenoscopy N/A 11/08/2012    Procedure: ESOPHAGOGASTRODUODENOSCOPY (EGD);  Surgeon: [name], MD;  Location: [name];  Service: Endoscopy;  Laterality: N/A;  . Cataract  extraction      "I think it was just one eye"  . Esophagogastroduodenoscopy N/A 10/04/2013    Procedure: ESOPHAGOGASTRODUODENOSCOPY (EGD);  Surgeon: Winfield Cunas., MD;  Location: [name];  Service: Endoscopy;  Laterality: N/A;  with APC on stand-by  . Esophagogastroduodenoscopy N/A 07/06/2014    Procedure: ESOPHAGOGASTRODUODENOSCOPY (EGD);  Surgeon: Clarene Essex, MD;  Location: [name];  Service: Endoscopy;  Laterality: N/A;  . Hot hemostasis N/A 07/06/2014    Procedure: HOT HEMOSTASIS (ARGON PLASMA COAGULATION/BICAP);  Surgeon: Clarene Essex, MD;  Location: [name];  Service: Endoscopy;  Laterality: N/A;  . Esophagogastroduodenoscopy N/A 09/05/2014    Procedure: ESOPHAGOGASTRODUODENOSCOPY (EGD);  Surgeon: Laurence Spates, MD;  Location: [name];  Service: Endoscopy;  Laterality: N/A;  APC on standby to control bleeding    Prior to Admission medications   Medication Sig Start Date End Date Taking? Authorizing Provider  AMITIZA 24 MCG capsule Take 24 mcg by mouth 2 (two) times daily. 08/29/14  Yes Historical Provider, MD  amitriptyline (ELAVIL) 25 MG tablet Take 25 mg by mouth daily. 10/12/14  Yes Historical Provider, MD  ferrous fumarate (HEMOCYTE - 106 MG FE) 325 (106 FE) MG TABS tablet Take 1 tablet by mouth daily.   Yes Historical Provider, MD  furosemide (LASIX) 40 MG tablet Take 0.5 tablets (20 mg total) by mouth daily. 11/14/14  Yes Liliane Shi, PA-C  insulin detemir (LEVEMIR) 100 UNIT/ML injection Inject 0.3 mLs (30 Units  total) into the skin at bedtime. Patient taking differently: Inject 30 Units into the skin 2 (two) times daily.  10/05/13  Yes Robbie Lis, MD  lactulose (CHRONULAC) 10 GM/15ML solution Take 15 mLs (10 g total) by mouth 2 (two) times daily. 10/31/14  Yes Evelina Bucy, MD  levETIRAcetam (KEPPRA) 500 MG tablet Take 1 tablet (500 mg total) by mouth 2 (two) times daily. 10/18/14  Yes Melvenia Beam, MD  metoprolol succinate (TOPROL XL) 25 MG 24 hr tablet Take  0.5 tablets (12.5 mg total) by mouth daily. 11/28/14  Yes Liliane Shi, PA-C  oxymetazoline (AFRIN) 0.05 % nasal spray Place 1 spray into both nostrils 2 (two) times daily as needed (Epistaxis). 10/05/13  Yes Robbie Lis, MD  pantoprazole (PROTONIX) 40 MG tablet Take 1 tablet (40 mg total) by mouth 2 (two) times daily. 07/07/14  Yes Hosie Poisson, MD  cyclobenzaprine (FLEXERIL) 5 MG tablet Take 1 tablet (5 mg total) by mouth 3 (three) times daily as needed for muscle spasms. Patient not taking: Reported on 11/29/2014 08/28/14   Robbie Lis, MD  ONE TOUCH ULTRA TEST test strip 1 each by Other route as needed for other.  11/21/14   Historical Provider, MD    Scheduled Meds: . sodium chloride   Intravenous STAT  . sodium chloride   Intravenous STAT  . sodium chloride   Intravenous Once  . amitriptyline  25 mg Oral QHS  . insulin aspart  0-15 Units Subcutaneous TID WC  . insulin detemir  20 Units Subcutaneous QHS  . levETIRAcetam  500 mg Oral BID  . metoprolol succinate  12.5 mg Oral Daily  . pantoprazole (PROTONIX) IV  80 mg Intravenous Once  . [START ON 12/02/2014] pantoprazole (PROTONIX) IV  40 mg Intravenous Q12H  . sodium chloride  3 mL Intravenous Q12H   Continuous Infusions: . sodium chloride    . pantoprozole (PROTONIX) infusion     PRN Meds:.acetaminophen **OR** acetaminophen, butamben-tetracaine-benzocaine, promethazine  Allergies as of 11/29/2014 - Review Complete 11/29/2014  Allergen Reaction Noted  . Aspirin Nausea And Vomiting 05/12/2006    Family History  Problem Relation Age of Onset  . Breast cancer    . Malignant hyperthermia Neg Hx   . Seizures Neg Hx   . Stroke Father     Social History   Social History  . Marital Status: Married    Spouse Name: [name]  . Number of Children: 0  . Years of Education: 9   Occupational History  . Retired Engineer, manufacturing systems    Social History Main Topics  . Smoking status: Former Smoker -- 2 years    Types: Cigarettes     Quit date: 02/10/1971  . Smokeless tobacco: Never Used     Comment: 09/15/2012 "smoked 50-60 yr ago"  . Alcohol Use: No  . Drug Use: No  . Sexual Activity: No   Other Topics Concern  . Not on file   Social History Narrative   Married, lives with husband, [name].  Ambulates without assistance.     Caffeine use: none    Review of Systems: All negative except as stated above in HPI.  Physical Exam: Vital signs: Filed Vitals:   11/29/14 1352  BP: 144/48  Pulse: 70  Temp: 99.3  Resp: 17     General:   Alert,  Well-developed, well-nourished, pleasant and cooperative in NAD, elderly HEENT: anicteric Lungs:  Clear throughout to auscultation.   No wheezes, crackles, or rhonchi. No acute  distress. Heart:  Regular rate and rhythm; no murmurs, clicks, rubs,  or gallops. Abdomen: soft, nontender, nondistended, +BS  Rectal:  Deferred Ext: no edema Skin: no jaundice  GI:  Lab Results:  Recent Labs  11/29/14 0449  WBC 6.7  HGB 6.0*  HCT 18.8*  PLT 217   BMET  Recent Labs  11/29/14 0449  NA 143  K 3.8  CL 112*  CO2 24  GLUCOSE 195*  BUN 15  CREATININE 0.94  CALCIUM 8.4*   LFT  Recent Labs  11/29/14 0449  PROT 4.9*  ALBUMIN 2.6*  AST 22  ALT 15  ALKPHOS 104  BILITOT 0.4   PT/INR  Recent Labs  11/29/14 0449  LABPROT 15.8*  INR 1.24     Studies/Results: No results found.  Impression/Plan: 74 yo with hematemesis and history of gastric AVMs in need of an EGD to further evaluate. NPO. Supportive care. Protonix drip.    LOS: 0 days   Lad[place]mith C.  11/29/2014, 1:56 PM  Pager [phone]  If no answer or after 5 PM call [phone]

## 2014-11-29 NOTE — ED Provider Notes (Signed)
CSN: [id number]     Arrival date & time 11/29/14  0358 History   First MD Initiated Contact with Patient 11/29/14 0410     Chief Complaint  Patient presents with  . GI Bleeding     (Consider location/radiation/quality/duration/timing/severity/associated sxs/prior Treatment) HPI Comments: 74 y.o. 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 She had a normal routine day yesterday but woke this morning to go to the bathroom. She had a single large bloody bowel movement with subsequent vomiting x 2 of bloody emesis. She reports history of the same. "I have had so many blood transfusions I can't count them anymore".  She denies pain, fever. She became too weak to stand after vomiting and EMS found her on the floor of her bathroom. She denies LOC.    Past Medical History  Diagnosis Date  . Anemia   . S/P endoscopy     with laser treatment  08/19/2006  . History of epistaxis   . Hyperlipidemia   . Telangiectasia     Gastric   . Lichen planus     Both lower extremities  . GERD (gastroesophageal reflux disease)   . Heart murmur   . CHF (congestive heart failure) ([place])   . Type II diabetes mellitus ([name]     insulin requiring.  Marland Kitchen History of blood transfusion "several"  . Chronic diastolic CHF (congestive heart failure) ([place]) 10/03/2013  . HTN (hypertension), benign 03/02/2012  . Family history of anesthesia complication     "niece has a hard time coming out" (09/15/2012)  . Pneumonia 1990's X 2  . Seizures ([place])     "they think she might be having seizures; they are checking her for that now" (10/02/2014)   Past Surgical History  Procedure Laterality Date  . Nose surgery      "for bleeding"   . Savory dilation  02/26/2011     Procedure: SAVORY DILATION;  Surgeon: [name], MD;  Location: [name];  Service: Endoscopy;  Laterality: N/A;  c-arm needed  . Esophagogastroduodenoscopy  02/26/2011    Procedure: ESOPHAGOGASTRODUODENOSCOPY (EGD);  Surgeon: [name], MD;  Location: [name];  Service: Endoscopy;  Laterality: N/A;  . Esophagogastroduodenoscopy N/A 11/08/2012    Procedure: ESOPHAGOGASTRODUODENOSCOPY (EGD);  Surgeon: [name], MD;  Location: [name];  Service: Endoscopy;  Laterality: N/A;  . Cataract extraction      "I think it was just one eye"  . Esophagogastroduodenoscopy N/A 10/04/2013    Procedure: ESOPHAGOGASTRODUODENOSCOPY (EGD);  Surgeon: Winfield Cunas., MD;  Location: [name];  Service: Endoscopy;  Laterality: N/A;  with APC on stand-by  . Esophagogastroduodenoscopy N/A 07/06/2014    Procedure: ESOPHAGOGASTRODUODENOSCOPY (EGD);  Surgeon: Clarene Essex, MD;  Location: [name];  Service: Endoscopy;  Laterality: N/A;  . Hot hemostasis N/A 07/06/2014    Procedure: HOT HEMOSTASIS (ARGON PLASMA COAGULATION/BICAP);  Surgeon: Clarene Essex, MD;  Location: [name];  Service: Endoscopy;  Laterality: N/A;  . Esophagogastroduodenoscopy N/A 09/05/2014    Procedure: ESOPHAGOGASTRODUODENOSCOPY (EGD);  Surgeon: Laurence Spates, MD;  Location: [name];  Service: Endoscopy;  Laterality: N/A;  APC on standby to control bleeding   Family History  Problem Relation Age of Onset  . Breast cancer    . Malignant hyperthermia Neg Hx   . Seizures  Neg Hx   . Stroke Father    Social History  Substance Use Topics  . Smoking status: Former Smoker -- 2 years    Types: Cigarettes    Quit date: 02/10/1971  . Smokeless tobacco: Never Used     Comment: 09/15/2012 "smoked 50-60 yr ago"  . Alcohol Use: No   OB History    No data available     Review of Systems  Constitutional: Negative for fever and chills.  HENT: Negative.   Respiratory: Negative.   Cardiovascular: Negative.    Gastrointestinal: Positive for vomiting and blood in stool. Negative for abdominal pain.  Musculoskeletal: Negative.   Skin: Negative.   Neurological: Positive for weakness. Negative for syncope.      Allergies  Aspirin  Home Medications   Prior to Admission medications   Medication Sig Start Date End Date Taking? Authorizing Provider  AMITIZA 24 MCG capsule Take 24 mcg by mouth 2 (two) times daily. 08/29/14   Historical Provider, MD  amitriptyline (ELAVIL) 25 MG tablet Take 25 mg by mouth daily. 10/12/14   Historical Provider, MD  cyclobenzaprine (FLEXERIL) 5 MG tablet Take 1 tablet (5 mg total) by mouth 3 (three) times daily as needed for muscle spasms. 08/28/14   Robbie Lis, MD  ferrous fumarate (HEMOCYTE - 106 MG FE) 325 (106 FE) MG TABS tablet Take 1 tablet by mouth daily.    Historical Provider, MD  furosemide (LASIX) 40 MG tablet Take 0.5 tablets (20 mg total) by mouth daily. 11/14/14   Liliane Shi, PA-C  insulin detemir (LEVEMIR) 100 UNIT/ML injection Inject 0.3 mLs (30 Units total) into the skin at bedtime. Patient taking differently: Inject 30 Units into the skin 2 (two) times daily.  10/05/13   Robbie Lis, MD  lactulose (CHRONULAC) 10 GM/15ML solution Take 15 mLs (10 g total) by mouth 2 (two) times daily. 10/31/14   Evelina Bucy, MD  levETIRAcetam (KEPPRA) 500 MG tablet Take 1 tablet (500 mg total) by mouth 2 (two) times daily. 10/18/14   Melvenia Beam, MD  metoprolol succinate (TOPROL XL) 25 MG 24 hr tablet Take 0.5 tablets (12.5 mg total) by mouth daily. 11/28/14   Liliane Shi, PA-C  ONE TOUCH ULTRA TEST test strip 1 each by Other route as needed for other.  11/21/14   Historical Provider, MD  oxymetazoline (AFRIN) 0.05 % nasal spray Place 1 spray into both nostrils 2 (two) times daily as needed (Epistaxis). 10/05/13   Robbie Lis, MD  pantoprazole (PROTONIX) 40 MG tablet Take 1 tablet (40 mg total) by mouth 2 (two) times daily. 07/07/14   Hosie Poisson, MD   BP 110/39  mmHg  Pulse 76  Temp(Src) 98.4 F (36.9 C) (Oral)  Resp 18  Ht 5\' 3"  (1.6 m)  Wt 144 lb (65.318 kg)  BMI 25.51 kg/m2  SpO2 100% Physical Exam  Constitutional: She is oriented to person, place, and time. She appears well-developed and well-nourished.  HENT:  Head: Normocephalic.  Neck: Normal range of motion. Neck supple.  Cardiovascular: Normal rate and regular rhythm.   Pulmonary/Chest: Effort normal and breath sounds normal. She has no wheezes. She has no rales.  Abdominal: Soft. Bowel sounds are normal. There is no tenderness. There is no rebound and no guarding.  Musculoskeletal: Normal range of motion.  Neurological: She is alert and oriented to person, place, and time.  Skin: Skin is warm and dry. No rash noted.  Psychiatric: She has a normal mood  and affect.    ED Course  Procedures (including critical care time) Labs Review Labs Reviewed  CBC WITH DIFFERENTIAL/PLATELET  PROTIME-INR  COMPREHENSIVE METABOLIC PANEL  AMMONIA  POC OCCULT BLOOD, ED  TYPE AND SCREEN   Results for orders placed or performed during the hospital encounter of 11/29/14  CBC with Differential  Result Value Ref Range   WBC 6.7 4.0 - 10.5 K/uL   RBC 2.15 (L) 3.87 - 5.11 MIL/uL   Hemoglobin 6.0 (LL) 12.0 - 15.0 g/dL   HCT 18.8 (L) 36.0 - 46.0 %   MCV 87.4 78.0 - 100.0 fL   MCH 27.9 26.0 - 34.0 pg   MCHC 31.9 30.0 - 36.0 g/dL   RDW 17.0 (H) 11.5 - 15.5 %   Platelets 217 150 - 400 K/uL   Neutrophils Relative % 78 %   Neutro Abs 5.3 1.7 - 7.7 K/uL   Lymphocytes Relative 10 %   Lymphs Abs 0.7 0.7 - 4.0 K/uL   Monocytes Relative 9 %   Monocytes Absolute 0.6 0.1 - 1.0 K/uL   Eosinophils Relative 3 %   Eosinophils Absolute 0.2 0.0 - 0.7 K/uL   Basophils Relative 0 %   Basophils Absolute 0.0 0.0 - 0.1 K/uL  Protime-INR  Result Value Ref Range   Prothrombin Time 15.8 (H) 11.6 - 15.2 seconds   INR 1.24 0.00 - 1.49  Comprehensive metabolic panel  Result Value Ref Range   Sodium 143 135 -  145 mmol/L   Potassium 3.8 3.5 - 5.1 mmol/L   Chloride 112 (H) 101 - 111 mmol/L   CO2 24 22 - 32 mmol/L   Glucose, Bld 195 (H) 65 - 99 mg/dL   BUN 15 6 - 20 mg/dL   Creatinine, Ser 0.94 0.44 - 1.00 mg/dL   Calcium 8.4 (L) 8.9 - 10.3 mg/dL   Total Protein 4.9 (L) 6.5 - 8.1 g/dL   Albumin 2.6 (L) 3.5 - 5.0 g/dL   AST 22 15 - 41 U/L   ALT 15 14 - 54 U/L   Alkaline Phosphatase 104 38 - 126 U/L   Total Bilirubin 0.4 0.3 - 1.2 mg/dL   GFR calc non Af Amer 58 (L) >60 mL/min   GFR calc Af Amer >60 >60 mL/min   Anion gap 7 5 - 15  Ammonia  Result Value Ref Range   Ammonia 82 (H) 9 - 35 umol/L  Type and screen [place]  Result Value Ref Range   ABO/RH(D) A POS    Antibody Screen PENDING    Sample Expiration 12/02/2014     Imaging Review No results found. I have personally reviewed and evaluated these images and lab results as part of my medical decision-making.   EKG Interpretation None      MDM   Final diagnoses:  None    1. Gi bleeding 2. Anemia  Patient is awake, alert, blood pressure improved. Gentle IV hydration provided. Hemoglobin 6.0 (last 7.3 2 weeks ago). Discussed the patient with [facility] resident at [facility] who accepts for transfer/admission.   Blood transfusion to be done at [facility] as blood bank orders are separated between hospitals. [facility] well familiar with the patient who they report has had 115 units transfused over the course of her illness. The patient appears stable for transfer.     Charlann Lange, PA-C 11/29/14 [id number]  Rolland Porter, MD 11/29/14 [phone]

## 2014-11-30 ENCOUNTER — Inpatient Hospital Stay (HOSPITAL_COMMUNITY): Payer: Medicare Other

## 2014-11-30 ENCOUNTER — Encounter (HOSPITAL_COMMUNITY): Payer: Self-pay | Admitting: Gastroenterology

## 2014-11-30 DIAGNOSIS — K31811 Angiodysplasia of stomach and duodenum with bleeding: Principal | ICD-10-CM

## 2014-11-30 LAB — CBC
HEMATOCRIT: 25.9 % — AB (ref 36.0–46.0)
HEMATOCRIT: 26.4 % — AB (ref 36.0–46.0)
HEMOGLOBIN: 8.3 g/dL — AB (ref 12.0–15.0)
Hemoglobin: 8.5 g/dL — ABNORMAL LOW (ref 12.0–15.0)
MCH: 28 pg (ref 26.0–34.0)
MCH: 28.3 pg (ref 26.0–34.0)
MCHC: 32 g/dL (ref 30.0–36.0)
MCHC: 32.2 g/dL (ref 30.0–36.0)
MCV: 87.5 fL (ref 78.0–100.0)
MCV: 88 fL (ref 78.0–100.0)
PLATELETS: 182 10*3/uL (ref 150–400)
Platelets: 182 10*3/uL (ref 150–400)
RBC: 2.96 MIL/uL — ABNORMAL LOW (ref 3.87–5.11)
RBC: 3 MIL/uL — ABNORMAL LOW (ref 3.87–5.11)
RDW: 15.9 % — ABNORMAL HIGH (ref 11.5–15.5)
RDW: 16.2 % — AB (ref 11.5–15.5)
WBC: 5.6 10*3/uL (ref 4.0–10.5)
WBC: 5.7 10*3/uL (ref 4.0–10.5)

## 2014-11-30 LAB — TYPE AND SCREEN
ABO/RH(D): A POS
ANTIBODY SCREEN: NEGATIVE
UNIT DIVISION: 0
Unit division: 0

## 2014-11-30 LAB — BASIC METABOLIC PANEL
Anion gap: 7 (ref 5–15)
BUN: 9 mg/dL (ref 6–20)
CHLORIDE: 110 mmol/L (ref 101–111)
CO2: 26 mmol/L (ref 22–32)
CREATININE: 0.8 mg/dL (ref 0.44–1.00)
Calcium: 8.4 mg/dL — ABNORMAL LOW (ref 8.9–10.3)
Glucose, Bld: 127 mg/dL — ABNORMAL HIGH (ref 65–99)
Potassium: 3.9 mmol/L (ref 3.5–5.1)
SODIUM: 143 mmol/L (ref 135–145)

## 2014-11-30 LAB — HIV ANTIBODY (ROUTINE TESTING W REFLEX): HIV SCREEN 4TH GENERATION: NONREACTIVE

## 2014-11-30 LAB — HEPATITIS PANEL, ACUTE
HCV Ab: <0.1 s/co ratio (ref 0.0–0.9)
HEP B C IGM: NEGATIVE
HEP B S AG: NEGATIVE
Hep A IgM: NEGATIVE

## 2014-11-30 LAB — GLUCOSE, CAPILLARY
GLUCOSE-CAPILLARY: 90 mg/dL (ref 65–99)
GLUCOSE-CAPILLARY: 124 mg/dL — AB (ref 65–99)
Glucose-Capillary: 227 mg/dL — ABNORMAL HIGH (ref 65–99)

## 2014-11-30 MED ORDER — TRAMADOL HCL 50 MG PO TABS: 50.0000 mg | ORAL_TABLET | Freq: Four times a day (QID) | ORAL | Status: DC | PRN

## 2014-11-30 MED ORDER — TRAMADOL HCL 50 MG PO TABS
50.0000 mg | ORAL_TABLET | Freq: Four times a day (QID) | ORAL | Status: DC | PRN
  Administered 2014-11-30: 50 mg via ORAL
  Filled 2014-11-30: qty 1

## 2014-11-30 MED ORDER — HEPARIN SOD (PORK) LOCK FLUSH 100 UNIT/ML IV SOLN
500.0000 [IU] | INTRAVENOUS | Status: AC | PRN
  Administered 2014-11-30: 500 [IU]

## 2014-11-30 NOTE — Progress Notes (Signed)
Discharge instructions given to the pt and family

## 2014-11-30 NOTE — Progress Notes (Signed)
Patient ID: [name], female   DOB: Jun 09, 1940, 74 y.o.   MRN: [id number]   Subjective: [name] was in high spirits this morning after getting her EGD and blood transfusion yesterday and she is ready to go home. She denies any more throwing up blood, lightheadedness, or blood in her stool. She knows what to look for if this happens again as she's no stranger to GI bleeds.  Objective: Vital signs in last 24 hours: Filed Vitals:   11/30/14 0600 11/30/14 0700 11/30/14 0756 11/30/14 0900  BP: 117/46 118/50 118/50 107/69  Pulse: 61 61 58 57  Temp:   98 F (36.7 C)   TempSrc:   Oral   Resp: 15 14 15 17   Height:      Weight:      SpO2:   97%    General: resting in bed HEENT: no scleral icterus. EOMI Cardiac: RRR, no rubs, murmurs or gallops Pulm: breathing well, CTAB Abd: soft, nontender, nondistended, BS present Ext: warm and well perfused, no pedal edema Lymph: no cervical LAD Skin: some telangiectasias on fingers and upper roof of mouth Neuro: alert and oriented X3, cranial nerves II-XII grossly intact  Lab Results: Basic Metabolic Panel:  Recent Labs Lab 11/29/14 0449 11/29/14 1607 11/30/14 0537  NA 143  --  143  K 3.8  --  3.9  CL 112*  --  110  CO2 24  --  26  GLUCOSE 195*  --  127*  BUN 15  --  9  CREATININE 0.94  --  0.80  CALCIUM 8.4*  --  8.4*  MG  --  1.6*  --    CBC:  Recent Labs Lab 11/29/14 0449 11/30/14 0537  WBC 6.7 5.6  NEUTROABS 5.3  --   HGB 6.0* 8.3*  HCT 18.8* 25.9*  MCV 87.4 87.5  PLT 217 182   Medications: I have reviewed the patient's current medications. Scheduled Meds: . sodium chloride   Intravenous Once  . amitriptyline  25 mg Oral QHS  . insulin aspart  0-15 Units Subcutaneous TID WC  . insulin detemir  20 Units Subcutaneous QHS  . levETIRAcetam  500 mg Oral BID  . metoprolol succinate  12.5 mg Oral Daily  . pantoprazole (PROTONIX) IV  40 mg Intravenous Q12H  . 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 Her Hgb was 8.3 this morning and she's been hemodynamically stable. Tolerating a normal diet well. -Thanks for your help Dr. Michail Sermon -Will follow CBC; if stable, she can go home today; she is cleared by GI to leave  Hypertension - Currently normotensive. -Resume home toprol-XL 12.5 mg daily -Continue home lasix 20 mg daily upon discharge  Insulin-dependent Type 2 DM - Sugars well-controlled -Will resume home insulin upon discharge  Seizure disorder - Last seizure in August of 2016.  -Resume home keppra 500 mg BID upon discharge  GERD - Pt with hematemesis this morning in setting of probable bleeding gastric AVM. Pt reports compliance with home protonix 40 mg BID. -Continue home protonix upon discharge  Insomnia - Currently stable.  -Resume home elavil 25 mg daily at bedtime  Dispo: Discharge today if hemoglobin stable  The patient does have a current PCP [name], DO) and does need an [facility] hospital  follow-up appointment after discharge.  The patient does not know have transportation limitations that hinder transportation to clinic appointments.  .Services Needed at time of discharge: Y = Yes, Blank = No PT:   OT:   RN:   Equipment:   Other:     LOS: 1 day   [name], MD 11/30/2014, 11:45 AM

## 2014-11-30 NOTE — Progress Notes (Signed)
  Date: 11/30/2014  Patient name: [name]  Medical record number: [id number]  Date of birth: 04/17/1940   I personally saw the patient with the resident team.   Her plan of care was discussed with the house staff. Please see Dr. Melburn Hake' note for complete details. I concur with his findings.  Agree with plan for discharge.  Ms. Jurs is doing very well this morning.  If her Hgb is stable, she can be discharged to home.    Sid Falcon, MD 11/30/2014, 2:13 PM

## 2014-11-30 NOTE — Progress Notes (Signed)
Utilization Review Completed.  

## 2014-11-30 NOTE — Clinical Documentation Improvement (Signed)
Internal Medicine Gastroenterology  Can the diagnosis of anemia be further specified?   Acute blood loss anemia  Acute on chronic blood loss anemia   Nutritional anemia, including the nutrition or mineral deficits  Anemia of chronic disease, including the associated chronic disease state  Other  Clinically Undetermined  Supporting Information. 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 A specific answer is not anticipated or expected.  Thank You,  Ezekiel Ina RN [place] [phone]

## 2014-11-30 NOTE — Discharge Instructions (Signed)
Gastrointestinal Bleeding Gastrointestinal (GI) bleeding means there is bleeding somewhere along the digestive tract, between the mouth and anus. CAUSES  There are many different problems that can cause GI bleeding. Possible causes include:  Esophagitis. This is inflammation, irritation, or swelling of the esophagus.  Hemorrhoids.These are veins that are full of blood (engorged) in the rectum. They cause pain, inflammation, and may bleed.  Anal fissures.These are areas of painful tearing which may bleed. They are often caused by passing hard stool.  Diverticulosis.These are pouches that form on the colon over time, with age, and may bleed significantly.  Diverticulitis.This is inflammation in areas with diverticulosis. It can cause pain, fever, and bloody stools, although bleeding is rare.  Polyps and cancer. Colon cancer often starts out as precancerous polyps.  Gastritis and ulcers.Bleeding from the upper gastrointestinal tract (near the stomach) may travel through the intestines and produce black, sometimes tarry, often bad smelling stools. In certain cases, if the bleeding is fast enough, the stools may not be black, but red. This condition may be life-threatening. SYMPTOMS   Vomiting bright red blood or material that looks like coffee grounds.  Bloody, black, or tarry stools. DIAGNOSIS  Your caregiver may diagnose your condition by taking your history and performing a physical exam. More tests may be needed, including:  X-rays and other imaging tests.  Esophagogastroduodenoscopy (EGD). This test uses a flexible, lighted tube to look at your esophagus, stomach, and small intestine.  Colonoscopy. This test uses a flexible, lighted tube to look at your colon. TREATMENT  Treatment depends on the cause of your bleeding.   For bleeding from the esophagus, stomach, small intestine, or colon, the caregiver doing your EGD or colonoscopy may be able to stop the bleeding as part of  the procedure.  Inflammation or infection of the colon can be treated with medicines.  Many rectal problems can be treated with creams, suppositories, or warm baths.  Surgery is sometimes needed.  Blood transfusions are sometimes needed if you have lost a lot of blood. If bleeding is slow, you may be allowed to go home. If there is a lot of bleeding, you will need to stay in the hospital for observation. HOME CARE INSTRUCTIONS   Take any medicines exactly as prescribed.  Keep your stools soft by eating foods that are high in fiber. These foods include whole grains, legumes, fruits, and vegetables. Prunes (1 to 3 a day) work well for many people.  Drink enough fluids to keep your urine clear or pale yellow. SEEK IMMEDIATE MEDICAL CARE IF:   Your bleeding increases.  You feel lightheaded, weak, or you faint.  You have severe cramps in your back or abdomen.  You pass large blood clots in your stool.  Your problems are getting worse. MAKE SURE YOU:   Understand these instructions.  Will watch your condition.  Will get help right away if you are not doing well or get worse.   This information is not intended to replace advice given to you by your health care provider. Make sure you discuss any questions you have with your health care provider.   Document Released: 01/24/2000 Document Revised: 01/13/2012 Document Reviewed: 07/16/2014 Elsevier Interactive Patient Education 2016 Elsevier Inc. Knee Pain Knee pain is a very common symptom and can have many causes. Knee pain often goes away when you follow your health care provider's instructions for relieving pain and discomfort at home. However, knee pain can develop into a condition that needs treatment. Some conditions  may include:  Arthritis caused by wear and tear (osteoarthritis).  Arthritis caused by swelling and irritation (rheumatoid arthritis or gout).  A cyst or growth in your knee.  An infection in your knee  joint.  An injury that will not heal.  Damage, swelling, or irritation of the tissues that support your knee (torn ligaments or tendinitis). If your knee pain continues, additional tests may be ordered to diagnose your condition. Tests may include X-rays or other imaging studies of your knee. You may also need to have fluid removed from your knee. Treatment for ongoing knee pain depends on the cause, but treatment may include:  Medicines to relieve pain or swelling.  Steroid injections in your knee.  Physical therapy.  Surgery. HOME CARE INSTRUCTIONS  Take medicines only as directed by your health care provider.  Rest your knee and keep it raised (elevated) while you are resting.  Do not do things that cause or worsen pain.  Avoid high-impact activities or exercises, such as running, jumping rope, or doing jumping jacks.  Apply ice to the knee area:  Put ice in a plastic bag.  Place a towel between your skin and the bag.  Leave the ice on for 20 minutes, 2-3 times a day.  Ask your health care provider if you should wear an elastic knee support.  Keep a pillow under your knee when you sleep.  Lose weight if you are overweight. Extra weight can put pressure on your knee.  Do not use any tobacco products, including cigarettes, chewing tobacco, or electronic cigarettes. If you need help quitting, ask your health care provider. Smoking may slow the healing of any bone and joint problems that you may have. SEEK MEDICAL CARE IF:  Your knee pain continues, changes, or gets worse.  You have a fever along with knee pain.  Your knee buckles or locks up.  Your knee becomes more swollen. SEEK IMMEDIATE MEDICAL CARE IF:   Your knee joint feels hot to the touch.  You have chest pain or trouble breathing.   This information is not intended to replace advice given to you by your health care provider. Make sure you discuss any questions you have with your health care provider.    Document Released: 11/23/2006 Document Revised: 02/16/2014 Document Reviewed: 09/11/2013 Elsevier Interactive Patient Education [employer].

## 2014-11-30 NOTE — Progress Notes (Signed)
Patient ID: [name], female   DOB: [birth year], 74 y.o.   MRN: [id number] [facility] Gastroenterology Progress Note  [name] 74 y.o. March 04, 1940   Subjective: Feels ok. Denies abdominal pain. Husband at bedside.  Objective: Vital signs in last 24 hours: Filed Vitals:   11/30/14 0900  BP: 107/69  Pulse: 57  Temp: 98  Resp: 17    Physical Exam: Gen: alert, no acute distress, elderly HEENT: anicteric sclera CV: RRR Chest: CTA B Abd: soft, nontender, nondistended, +BS  Lab Results:  Recent Labs  11/29/14 0449 11/29/14 1607 11/30/14 0537  NA 143  --  143  K 3.8  --  3.9  CL 112*  --  110  CO2 24  --  26  GLUCOSE 195*  --  127*  BUN 15  --  9  CREATININE 0.94  --  0.80  CALCIUM 8.4*  --  8.4*  MG  --  1.6*  --     Recent Labs  11/29/14 0449  AST 22  ALT 15  ALKPHOS 104  BILITOT 0.4  PROT 4.9*  ALBUMIN 2.6*    Recent Labs  11/29/14 0449 11/30/14 0537  WBC 6.7 5.6  NEUTROABS 5.3  --   HGB 6.0* 8.3*  HCT 18.8* 25.9*  MCV 87.4 87.5  PLT 217 182    Recent Labs  11/29/14 0449  LABPROT 15.8*  INR 1.24      Assessment/Plan: 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 Hgb 8.3 after 2 U PRBCs. Watch H/Hs. Advance diet. Will follow.   [place] C. 11/30/2014, 11:25 AM  Pager [phone]  If no answer or after 5 PM call [phone]

## 2014-11-30 NOTE — Progress Notes (Signed)
Ice pack applied on the  Swollen right knee, pain medicine given.

## 2014-12-02 LAB — LEVETIRACETAM LEVEL: LEVETIRACETAM: 6.8 ug/mL — AB (ref 10.0–40.0)

## 2014-12-04 DIAGNOSIS — E119 Type 2 diabetes mellitus without complications: Secondary | ICD-10-CM | POA: Diagnosis not present

## 2014-12-04 LAB — HM DIABETES EYE EXAM

## 2014-12-07 ENCOUNTER — Encounter: Payer: Self-pay | Admitting: Internal Medicine

## 2014-12-07 ENCOUNTER — Ambulatory Visit (INDEPENDENT_AMBULATORY_CARE_PROVIDER_SITE_OTHER): Payer: Medicare Other | Admitting: Internal Medicine

## 2014-12-07 VITALS — BP 125/34 | HR 68 | Temp 97.6°F | Ht 61.0 in | Wt 141.9 lb

## 2014-12-07 DIAGNOSIS — E1165 Type 2 diabetes mellitus with hyperglycemia: Secondary | ICD-10-CM

## 2014-12-07 DIAGNOSIS — N289 Disorder of kidney and ureter, unspecified: Secondary | ICD-10-CM

## 2014-12-07 DIAGNOSIS — K31819 Angiodysplasia of stomach and duodenum without bleeding: Secondary | ICD-10-CM

## 2014-12-07 DIAGNOSIS — E1129 Type 2 diabetes mellitus with other diabetic kidney complication: Secondary | ICD-10-CM

## 2014-12-07 DIAGNOSIS — Z794 Long term (current) use of insulin: Secondary | ICD-10-CM

## 2014-12-07 DIAGNOSIS — Q2733 Arteriovenous malformation of digestive system vessel: Secondary | ICD-10-CM

## 2014-12-07 DIAGNOSIS — IMO0002 Reserved for concepts with insufficient information to code with codable children: Secondary | ICD-10-CM

## 2014-12-07 DIAGNOSIS — I959 Hypotension, unspecified: Secondary | ICD-10-CM

## 2014-12-07 LAB — CBC
HCT: 28.1 % — ABNORMAL LOW (ref 36.0–46.0)
HEMOGLOBIN: 8.8 g/dL — AB (ref 12.0–15.0)
MCH: 27.4 pg (ref 26.0–34.0)
MCHC: 31.3 g/dL (ref 30.0–36.0)
MCV: 87.5 fL (ref 78.0–100.0)
Platelets: 245 10*3/uL (ref 150–400)
RBC: 3.21 MIL/uL — AB (ref 3.87–5.11)
RDW: 15.4 % (ref 11.5–15.5)
WBC: 4.5 10*3/uL (ref 4.0–10.5)

## 2014-12-07 LAB — POCT GLYCOSYLATED HEMOGLOBIN (HGB A1C): Hemoglobin A1C: 5.7

## 2014-12-07 LAB — GLUCOSE, CAPILLARY: Glucose-Capillary: 184 mg/dL — ABNORMAL HIGH (ref 65–99)

## 2014-12-07 NOTE — Patient Instructions (Addendum)
Please return for a follow-up visit at our clinic in 2 weeks.   Go to [facility] on 12/24/14 for a blood count check.

## 2014-12-10 ENCOUNTER — Ambulatory Visit: Payer: Medicare Other | Admitting: Internal Medicine

## 2014-12-10 NOTE — Assessment & Plan Note (Addendum)
Initial BP 125/34 and repeat 130/50. Patient has a history of hypotension and is being seen by caridiology. -Continue Metoprolol 12.5 mg daily  -Continue Furosemide 20 mg in the setting of lower extremity edema

## 2014-12-10 NOTE — Assessment & Plan Note (Addendum)
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 She underwent EGD and gastric AVM was sclerosed. Patient was transfused with 2u PRBCs. Hgb 8.5 upon discharge. At present patient denies having any episodes of hematemesis, hematochezia, or melena. Denies having any abdominal pain. Denies any dizziness, weakness, chest pain, or shortness of breath. Hgb checked at this visit stable at 8.8.  -Patient is due for a CBC check at [facility] on 12/24/14.  -Continue iron supplementation

## 2014-12-10 NOTE — Assessment & Plan Note (Addendum)
A1c 5.7 at this visit. Improved from 7.3 in 08/2014. Patient is currently on Levemir 30 u BID. She did not bring her glucose meter with her today. -Discuss at next visit. Patient has been advised to bring her meter with her to next visit in 2 weeks.

## 2014-12-10 NOTE — Progress Notes (Addendum)
Patient ID: [name], female   DOB: 1940/11/19, 74 y.o.   MRN: [id number]   Subjective:   Patient ID: [name] female   DOB: 12-11-1940 74 y.o.   MRN: [id number]  HPI: [name] is a 74 y.o. F with a PMHx of conditions listed below presenting to the clinic for a hospital follow up. 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 She underwent EGD and gastric AVM was sclerosed. Patient was transfused with 2u PRBCs. Hgb 8.5 upon discharge. At present patient denies having any episodes of hematemesis, hematochezia, or melena. Denies having any abdominal pain. Denies any dizziness, weakness, chest pain, or shortness of breath.     Past Medical History  Diagnosis Date  . History of epistaxis   . Hyperlipidemia   . Telangiectasia     Gastric   . Lichen planus     Both lower extremities  . GERD (gastroesophageal reflux disease)   . Heart murmur   . CHF (congestive heart failure) ([place])   . History of blood transfusion "several"  . Chronic diastolic CHF (congestive heart failure) ([place]) 10/03/2013  . HTN (hypertension), benign 03/02/2012  . Pneumonia 1990's X 2  . Family history of anesthesia complication     "niece has a hard time coming out" (09/15/2012)  . Type II diabetes mellitus ([name]     insulin requiring.  . Seizures ([place]) 09/2014  . Frequent nosebleeds     chronic  . Chronic GI bleeding     [name] 11/29/2014  . Iron deficiency anemia     chronic infusions"  . Chronic anemia   . 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)     [name] 11/29/2014  . Gastric AV malformation     /notes 11/29/2014   Current Outpatient Prescriptions  Medication Sig Dispense Refill  . AMITIZA 24 MCG capsule Take 24 mcg by mouth 2 (two) times daily.    Marland Kitchen amitriptyline (ELAVIL) 25 MG tablet Take 25 mg by mouth daily.    . ferrous fumarate (HEMOCYTE - 106 MG FE) 325 (106 FE) MG TABS tablet Take 1 tablet by mouth daily.    . furosemide  (LASIX) 40 MG tablet Take 0.5 tablets (20 mg total) by mouth daily.    . insulin detemir (LEVEMIR) 100 UNIT/ML injection Inject 0.3 mLs (30 Units total) into the skin at bedtime. (Patient taking differently: Inject 30 Units into the skin 2 (two) times daily. ) 10 mL 11  . lactulose (CHRONULAC) 10 GM/15ML solution Take 15 mLs (10 g total) by mouth 2 (two) times daily. 240 mL 0  . levETIRAcetam (KEPPRA) 500 MG tablet Take 1 tablet (500 mg total) by mouth 2 (two) times daily. 60 tablet 6  . metoprolol succinate (TOPROL XL) 25 MG 24 hr tablet Take 0.5 tablets (12.5 mg total) by mouth daily. 30 tablet 11  . ONE TOUCH ULTRA TEST test strip 1 each by Other route as needed for other.     Marland Kitchen oxymetazoline (AFRIN) 0.05 % nasal spray Place 1 spray into both nostrils 2 (two) times daily as needed (Epistaxis). 30 mL 0  . pantoprazole (PROTONIX) 40 MG tablet Take 1 tablet (40 mg total) by mouth 2 (two) times daily. 60 tablet 1  . traMADol (ULTRAM) 50 MG tablet Take 1 tablet (50 mg total) by mouth every 6 (six) hours as needed. 30 tablet 0   No current facility-administered medications for this visit.   Family History  Problem Relation Age of Onset  . Breast cancer    . Malignant hyperthermia Neg Hx   . Seizures Neg Hx   . Stroke Father    Social History   Social History  . Marital Status: Married    Spouse Name: [name]  . Number of Children: 0  . Years of Education: 9   Occupational History  . Retired Engineer, manufacturing systems    Social History Main Topics  . Smoking status: Former Smoker -- 1.00 packs/day for 20 years    Types: Cigarettes    Quit date: 02/10/1971  . Smokeless tobacco: Never Used     Comment: 09/15/2012 "smoked 50-60 yr ago"  . Alcohol Use: No  . Drug Use: No  . Sexual Activity: No   Other Topics Concern  . None   Social History Narrative   Married, lives with husband, [name].  Ambulates without assistance.     Caffeine use: none   Review of Systems: Review of Systems    Constitutional: Negative for fever and chills.  HENT: Negative for ear pain.   Eyes: Negative for blurred vision and pain.  Respiratory: Negative for cough, shortness of breath and wheezing.   Cardiovascular: Negative for chest pain and palpitations.  Gastrointestinal: Negative for nausea, vomiting, abdominal pain, diarrhea, constipation, blood in stool and melena.  Genitourinary: Negative for dysuria, urgency, frequency and hematuria.  Musculoskeletal: Negative for myalgias.  Skin: Negative for itching and rash.  Neurological: Negative for sensory change, focal weakness and headaches.   Objective:  Physical Exam: Filed Vitals:   12/07/14 1332  BP: 125/34  Pulse: 68  Temp: 97.6 F (36.4 C)  TempSrc: Oral  Height: 5\' 1"  (1.549 m)  Weight: 141 lb 14.4 oz (64.365 kg)  SpO2: 100%   Physical Exam  Constitutional: She is oriented to person, place, and time. She appears well-developed and well-nourished. No distress.  HENT:  Head: Normocephalic and atraumatic.  Eyes: EOM are normal. Pupils are equal, round, and reactive to light.  Neck: Neck supple. No tracheal deviation present.  Cardiovascular: Normal rate, regular rhythm and intact distal pulses.   Murmur heard. Grade 3/6 systolic ejection murmur  Pulmonary/Chest: Effort normal. No respiratory distress. She has no wheezes. She has no rales.  Abdominal: Soft. Bowel sounds are normal. She exhibits no distension. There is no tenderness.  Neurological: She is alert and oriented to person, place, and time.  Skin: Skin is warm and dry.   Assessment & Plan:   Addendum: Trace pitting edema of lower extremities noted.

## 2014-12-11 NOTE — Progress Notes (Signed)
Medicine attending: I personally interviewed and briefly examined this patient, and reviewed pertinent clinical laboratory and radiographic data  with resident physician Dr.Vasu Rathore on the day of the patient visit and we discussed a  management plan. Patient well known to me. Hereditary Hemorrhagic Telangiectasias with chronic, ongoing, GI blood loss requiring frequent blood transfusions and parenteral iron infusions. Recent hospitalization when bleeding outpaced outpatient replacement and she underwent another laser ablation procedure of gastric AVMs related to her HHT. We are monitoring cbcs every 2 weeks. She prefers to get elective transfusions at the [facility].

## 2014-12-13 DIAGNOSIS — M545 Low back pain: Secondary | ICD-10-CM | POA: Diagnosis not present

## 2014-12-13 DIAGNOSIS — E1140 Type 2 diabetes mellitus with diabetic neuropathy, unspecified: Secondary | ICD-10-CM | POA: Diagnosis not present

## 2014-12-13 DIAGNOSIS — I5032 Chronic diastolic (congestive) heart failure: Secondary | ICD-10-CM | POA: Diagnosis not present

## 2014-12-14 DIAGNOSIS — M545 Low back pain: Secondary | ICD-10-CM | POA: Diagnosis not present

## 2014-12-14 DIAGNOSIS — I5032 Chronic diastolic (congestive) heart failure: Secondary | ICD-10-CM | POA: Diagnosis not present

## 2014-12-14 DIAGNOSIS — E1140 Type 2 diabetes mellitus with diabetic neuropathy, unspecified: Secondary | ICD-10-CM | POA: Diagnosis not present

## 2014-12-15 DIAGNOSIS — E1140 Type 2 diabetes mellitus with diabetic neuropathy, unspecified: Secondary | ICD-10-CM | POA: Diagnosis not present

## 2014-12-15 DIAGNOSIS — I5032 Chronic diastolic (congestive) heart failure: Secondary | ICD-10-CM | POA: Diagnosis not present

## 2014-12-15 DIAGNOSIS — M545 Low back pain: Secondary | ICD-10-CM | POA: Diagnosis not present

## 2014-12-16 DIAGNOSIS — I5032 Chronic diastolic (congestive) heart failure: Secondary | ICD-10-CM | POA: Diagnosis not present

## 2014-12-16 DIAGNOSIS — E1140 Type 2 diabetes mellitus with diabetic neuropathy, unspecified: Secondary | ICD-10-CM | POA: Diagnosis not present

## 2014-12-16 DIAGNOSIS — M545 Low back pain: Secondary | ICD-10-CM | POA: Diagnosis not present

## 2014-12-17 DIAGNOSIS — E1140 Type 2 diabetes mellitus with diabetic neuropathy, unspecified: Secondary | ICD-10-CM | POA: Diagnosis not present

## 2014-12-17 DIAGNOSIS — M545 Low back pain: Secondary | ICD-10-CM | POA: Diagnosis not present

## 2014-12-17 DIAGNOSIS — I5032 Chronic diastolic (congestive) heart failure: Secondary | ICD-10-CM | POA: Diagnosis not present

## 2014-12-18 DIAGNOSIS — E1140 Type 2 diabetes mellitus with diabetic neuropathy, unspecified: Secondary | ICD-10-CM | POA: Diagnosis not present

## 2014-12-18 DIAGNOSIS — M545 Low back pain: Secondary | ICD-10-CM | POA: Diagnosis not present

## 2014-12-18 DIAGNOSIS — I5032 Chronic diastolic (congestive) heart failure: Secondary | ICD-10-CM | POA: Diagnosis not present

## 2014-12-19 DIAGNOSIS — E1140 Type 2 diabetes mellitus with diabetic neuropathy, unspecified: Secondary | ICD-10-CM | POA: Diagnosis not present

## 2014-12-19 DIAGNOSIS — I5032 Chronic diastolic (congestive) heart failure: Secondary | ICD-10-CM | POA: Diagnosis not present

## 2014-12-19 DIAGNOSIS — M545 Low back pain: Secondary | ICD-10-CM | POA: Diagnosis not present

## 2014-12-20 DIAGNOSIS — I5032 Chronic diastolic (congestive) heart failure: Secondary | ICD-10-CM | POA: Diagnosis not present

## 2014-12-20 DIAGNOSIS — E1140 Type 2 diabetes mellitus with diabetic neuropathy, unspecified: Secondary | ICD-10-CM | POA: Diagnosis not present

## 2014-12-20 DIAGNOSIS — M545 Low back pain: Secondary | ICD-10-CM | POA: Diagnosis not present

## 2014-12-21 ENCOUNTER — Encounter: Payer: Self-pay | Admitting: Internal Medicine

## 2014-12-21 ENCOUNTER — Ambulatory Visit (INDEPENDENT_AMBULATORY_CARE_PROVIDER_SITE_OTHER): Payer: Medicare Other | Admitting: Internal Medicine

## 2014-12-21 VITALS — BP 116/44 | HR 69 | Temp 98.1°F | Wt 136.7 lb

## 2014-12-21 DIAGNOSIS — IMO0002 Reserved for concepts with insufficient information to code with codable children: Secondary | ICD-10-CM

## 2014-12-21 DIAGNOSIS — Z794 Long term (current) use of insulin: Secondary | ICD-10-CM

## 2014-12-21 DIAGNOSIS — I780 Hereditary hemorrhagic telangiectasia: Secondary | ICD-10-CM

## 2014-12-21 DIAGNOSIS — I10 Essential (primary) hypertension: Secondary | ICD-10-CM | POA: Diagnosis not present

## 2014-12-21 DIAGNOSIS — N289 Disorder of kidney and ureter, unspecified: Secondary | ICD-10-CM | POA: Diagnosis not present

## 2014-12-21 DIAGNOSIS — E1165 Type 2 diabetes mellitus with hyperglycemia: Principal | ICD-10-CM

## 2014-12-21 DIAGNOSIS — E1129 Type 2 diabetes mellitus with other diabetic kidney complication: Secondary | ICD-10-CM | POA: Diagnosis not present

## 2014-12-21 DIAGNOSIS — I5032 Chronic diastolic (congestive) heart failure: Secondary | ICD-10-CM | POA: Diagnosis not present

## 2014-12-21 DIAGNOSIS — E1140 Type 2 diabetes mellitus with diabetic neuropathy, unspecified: Secondary | ICD-10-CM | POA: Diagnosis not present

## 2014-12-21 DIAGNOSIS — M545 Low back pain: Secondary | ICD-10-CM | POA: Diagnosis not present

## 2014-12-21 NOTE — Patient Instructions (Signed)
CALL [facility] TO MAKE SURE YOU HAVE AN APPOINTMENT FOR YOUR BLOOD TO BE CHECKED ON MONDAY, [place]. IF YOU ARE NOT ABLE TO SEE THEM, PLEASE LET us KNOW AND WE CAN CHECK YOUR BLOOD.  CONTINUE TAKING YOUR LEVEMIR 30 UNITS TWICE A DAY.   CHECK YOUR BLOOD SUGAR IN THE MORNING BEFORE YOU EAT AND AT NIGHT EVERYDAY. IF YOU HAVE ANY LOW SUGARS LESS THAN 70. PLEASE CALL us.   FOLLOW UP IN ONE MONTH.

## 2014-12-21 NOTE — Assessment & Plan Note (Signed)
BP Readings from Last 3 Encounters:  12/21/14 116/44  12/07/14 125/34  11/30/14 104/42    Lab Results  Component Value Date   NA 143 11/30/2014   K 3.9 11/30/2014   CREATININE 0.80 11/30/2014    Assessment: Blood pressure control:  Controlled Progress toward BP goal:   At goal Comments: Compliant with metoprolol 12.5 mg daily and furosemide 20 mg daily. She denies any lightheadedness or dizziness.  Plan: Medications:  continue current medications Educational resources provided: brochure, handout, video

## 2014-12-21 NOTE — Assessment & Plan Note (Signed)
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 AVM was sclerosed and patient was given 2 units of pRBCs. Discharge hemoglobin was 8.5. Hgb 9.8 on 10/28. She is on iron. She denies any melena, hematochezia or lightheadedness. Patient is supposed to follow up at the cancer center on 12/24/14 for a CBC with possible transfusion. I do not see this appointment in the computer, but patient states she will call and if she cannot get in, then she will call and let us know and we can do a CBC check here.   Plan: -Continue iron supplementation -Follow up at [facility] for CBC and possible tranfusion

## 2014-12-21 NOTE — Assessment & Plan Note (Signed)
Lab Results  Component Value Date   HGBA1C 5.7 12/07/2014   HGBA1C 7.3* 09/05/2014   HGBA1C 7.6* 08/27/2014     Assessment: Diabetes control:  At goal Progress toward A1C goal:   Well controlled Comments: Patient is on levemir 30 units BID. She denies any hypoglycemic events, but I worry with her A1c being so low. She just obtained her meter yesterday and only has one reading on it- 275.   Plan: Medications:  continue current medications Home glucose monitoring: Frequency:  TID Timing:  ACHS Instruction/counseling given: reminded to bring blood glucose meter & log to each visit Educational resources provided: brochure, handout Self management tools provided: copy of home glucose meter download Other plans: Patient will continue to check her glucose 2-3 times per day. She understands to notify us if she feels dizzy, diaphoretic, confused or if her CBG is <70. She will follow up in 2-4 weeks.

## 2014-12-21 NOTE — Progress Notes (Signed)
Subjective:    Patient ID: [name], female    DOB: 09-24-1940, 74 y.o.   MRN: [id number]  HPI [name] is a 74 y.o. female with PMHx of Osler-Weber-Rendu syndrome and T2DM who presents to the clinic for follow up for her T2DM. Please see A&P for the status of the patient's chronic medical problems.   Past Medical History  Diagnosis Date  . History of epistaxis   . Hyperlipidemia   . Telangiectasia     Gastric   . Lichen planus     Both lower extremities  . GERD (gastroesophageal reflux disease)   . Heart murmur   . CHF (congestive heart failure) ([place])   . History of blood transfusion "several"  . Chronic diastolic CHF (congestive heart failure) ([place]) 10/03/2013  . HTN (hypertension), benign 03/02/2012  . Pneumonia 1990's X 2  . Family history of anesthesia complication     "niece has a hard time coming out" (09/15/2012)  . Type II diabetes mellitus ([name]     insulin requiring.  . Seizures ([place]) 09/2014  . Frequent nosebleeds     chronic  . Chronic GI bleeding     [name] 11/29/2014  . Iron deficiency anemia     chronic infusions"  . Chronic anemia   . 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 Gastric AV malformation     /notes 11/29/2014    Outpatient Encounter Prescriptions as of 12/21/2014  Medication Sig Note  . AMITIZA 24 MCG capsule Take 24 mcg by mouth 2 (two) times daily.   Marland Kitchen amitriptyline (ELAVIL) 25 MG tablet Take 25 mg by mouth daily.   . ferrous fumarate (HEMOCYTE - 106 MG FE) 325 (106 FE) MG TABS tablet Take 1 tablet by mouth daily.   . furosemide (LASIX) 40 MG tablet Take 0.5 tablets (20 mg total) by mouth daily.   . insulin detemir (LEVEMIR) 100 UNIT/ML injection Inject 0.3 mLs (30 Units total) into the skin at bedtime. (Patient taking differently: Inject 30 Units into the skin 2 (two) times daily. )   . lactulose (CHRONULAC) 10 GM/15ML solution Take 15 mLs (10 g total) by mouth 2 (two) times daily.   Marland Kitchen levETIRAcetam (KEPPRA) 500  MG tablet Take 1 tablet (500 mg total) by mouth 2 (two) times daily.   . metoprolol succinate (TOPROL XL) 25 MG 24 hr tablet Take 0.5 tablets (12.5 mg total) by mouth daily.   . ONE TOUCH ULTRA TEST test strip 1 each by Other route as needed for other.    Marland Kitchen oxymetazoline (AFRIN) 0.05 % nasal spray Place 1 spray into both nostrils 2 (two) times daily as needed (Epistaxis). 11/29/2014: Pt ran out.  . pantoprazole (PROTONIX) 40 MG tablet Take 1 tablet (40 mg total) by mouth 2 (two) times daily.   . traMADol (ULTRAM) 50 MG tablet Take 1 tablet (50 mg total) by mouth every 6 (six) hours as needed.    No facility-administered encounter medications on file as of 12/21/2014.    Family History  Problem Relation Age of Onset  . Breast cancer    . Malignant hyperthermia Neg Hx   . Seizures Neg Hx   . Stroke Father     Social History   Social History  . Marital Status: Married    Spouse Name: [name]  . Number of Children: 0  . Years of Education: 9   Occupational History  . Retired Engineer, manufacturing systems    Social History  Main Topics  . Smoking status: Former Smoker -- 1.00 packs/day for 20 years    Types: Cigarettes    Quit date: 02/10/1971  . Smokeless tobacco: Never Used     Comment: 09/15/2012 "smoked 50-60 yr ago"  . Alcohol Use: No  . Drug Use: No  . Sexual Activity: No   Other Topics Concern  . Not on file   Social History Narrative   Married, lives with husband, [name].  Ambulates without assistance.     Caffeine use: none   Review of Systems General: Admits to decreased appetite. Denies fatigue and diaphoresis.  Respiratory: Denies SOB, cough  Cardiovascular: Denies chest pain and palpitations.  Gastrointestinal: Denies abdominal pain, blood in stool   Endocrine: Denies polyuria, and polydipsia. Skin: Denies pallor, rash and wounds.  Neurological: Denies dizziness, headaches, weakness, lightheadedness    Objective:   Physical Exam Filed Vitals:   12/21/14 0959  BP: 116/44    Pulse: 69  Temp: 98.1 F (36.7 C)  TempSrc: Oral  Weight: 136 lb 11.2 oz (62.007 kg)  SpO2: 100%   General: Vital signs reviewed.  Patient is an elderly female, in no acute distress and cooperative with exam.   Cardiovascular: RRR, 3/6 systolic murmur. Pulmonary/Chest: Clear to auscultation bilaterally, no wheezes, rales, or rhonchi. Abdominal: Soft, non-tender, non-distended, BS + Extremities: No lower extremity edema bilaterally, pulses symmetric and intact bilaterally. No cyanosis or clubbing. Neuro: Essential tremor noted. Skin: Telangiectasias.     Assessment & Plan:   Please see problem based assessment and plan.

## 2014-12-21 NOTE — Progress Notes (Signed)
Medicine attending: Medical history, presenting problems, physical findings, and medications, reviewed with resident physician Dr Osa Craver on the day of the patient visit and I concur with her evaluation and management plan.

## 2014-12-22 DIAGNOSIS — I5032 Chronic diastolic (congestive) heart failure: Secondary | ICD-10-CM | POA: Diagnosis not present

## 2014-12-22 DIAGNOSIS — E1140 Type 2 diabetes mellitus with diabetic neuropathy, unspecified: Secondary | ICD-10-CM | POA: Diagnosis not present

## 2014-12-22 DIAGNOSIS — M545 Low back pain: Secondary | ICD-10-CM | POA: Diagnosis not present

## 2014-12-23 DIAGNOSIS — I5032 Chronic diastolic (congestive) heart failure: Secondary | ICD-10-CM | POA: Diagnosis not present

## 2014-12-23 DIAGNOSIS — M545 Low back pain: Secondary | ICD-10-CM | POA: Diagnosis not present

## 2014-12-23 DIAGNOSIS — E1140 Type 2 diabetes mellitus with diabetic neuropathy, unspecified: Secondary | ICD-10-CM | POA: Diagnosis not present

## 2014-12-24 ENCOUNTER — Ambulatory Visit (HOSPITAL_COMMUNITY)
Admission: RE | Admit: 2014-12-24 | Discharge: 2014-12-24 | Disposition: A | Discharge status: Home / Self Care | Payer: Medicare Other | Source: Ambulatory Visit | Attending: Oncology | Admitting: Oncology

## 2014-12-24 ENCOUNTER — Other Ambulatory Visit: Payer: Self-pay | Admitting: Oncology

## 2014-12-24 ENCOUNTER — Other Ambulatory Visit: Payer: Self-pay | Admitting: Dietician

## 2014-12-24 ENCOUNTER — Ambulatory Visit (HOSPITAL_BASED_OUTPATIENT_CLINIC_OR_DEPARTMENT_OTHER): Payer: Medicare Other

## 2014-12-24 DIAGNOSIS — E1129 Type 2 diabetes mellitus with other diabetic kidney complication: Secondary | ICD-10-CM

## 2014-12-24 DIAGNOSIS — K31811 Angiodysplasia of stomach and duodenum with bleeding: Secondary | ICD-10-CM | POA: Diagnosis not present

## 2014-12-24 DIAGNOSIS — IMO0002 Reserved for concepts with insufficient information to code with codable children: Secondary | ICD-10-CM

## 2014-12-24 DIAGNOSIS — I780 Hereditary hemorrhagic telangiectasia: Secondary | ICD-10-CM

## 2014-12-24 DIAGNOSIS — E1140 Type 2 diabetes mellitus with diabetic neuropathy, unspecified: Secondary | ICD-10-CM | POA: Diagnosis not present

## 2014-12-24 DIAGNOSIS — D500 Iron deficiency anemia secondary to blood loss (chronic): Secondary | ICD-10-CM | POA: Diagnosis not present

## 2014-12-24 DIAGNOSIS — D509 Iron deficiency anemia, unspecified: Secondary | ICD-10-CM

## 2014-12-24 DIAGNOSIS — M545 Low back pain: Secondary | ICD-10-CM | POA: Diagnosis not present

## 2014-12-24 DIAGNOSIS — K31819 Angiodysplasia of stomach and duodenum without bleeding: Secondary | ICD-10-CM

## 2014-12-24 DIAGNOSIS — E1165 Type 2 diabetes mellitus with hyperglycemia: Principal | ICD-10-CM

## 2014-12-24 DIAGNOSIS — I5032 Chronic diastolic (congestive) heart failure: Secondary | ICD-10-CM | POA: Diagnosis not present

## 2014-12-24 LAB — CBC WITH DIFFERENTIAL/PLATELET
BASO%: 0.4 % (ref 0.0–2.0)
Basophils Absolute: 0 10*3/uL (ref 0.0–0.1)
EOS%: 4.3 % (ref 0.0–7.0)
Eosinophils Absolute: 0.3 10*3/uL (ref 0.0–0.5)
HEMATOCRIT: 25.9 % — AB (ref 34.8–46.6)
HGB: 7.8 g/dL — ABNORMAL LOW (ref 11.6–15.9)
LYMPH%: 10.3 % — AB (ref 14.0–49.7)
MCH: 25 pg — AB (ref 25.1–34.0)
MCHC: 30.1 g/dL — AB (ref 31.5–36.0)
MCV: 83 fL (ref 79.5–101.0)
MONO#: 0.5 10*3/uL (ref 0.1–0.9)
MONO%: 7.3 % (ref 0.0–14.0)
NEUT#: 5.6 10*3/uL (ref 1.5–6.5)
NEUT%: 77.7 % — AB (ref 38.4–76.8)
PLATELETS: 205 10*3/uL (ref 145–400)
RBC: 3.12 10*6/uL — ABNORMAL LOW (ref 3.70–5.45)
RDW: 15.4 % — ABNORMAL HIGH (ref 11.2–14.5)
WBC: 7.2 10*3/uL (ref 3.9–10.3)
lymph#: 0.7 10*3/uL — ABNORMAL LOW (ref 0.9–3.3)
nRBC: 0 % (ref 0–0)

## 2014-12-24 LAB — HOLD TUBE, BLOOD BANK

## 2014-12-24 LAB — FERRITIN CHCC: FERRITIN: 11 ng/mL (ref 9–269)

## 2014-12-24 NOTE — Telephone Encounter (Signed)
Patient calls requesting a meter and supplies saying her meter is not working.

## 2014-12-25 ENCOUNTER — Ambulatory Visit (HOSPITAL_BASED_OUTPATIENT_CLINIC_OR_DEPARTMENT_OTHER): Payer: Medicare Other

## 2014-12-25 VITALS — BP 131/46 | HR 64 | Temp 97.1°F | Resp 18

## 2014-12-25 DIAGNOSIS — K31811 Angiodysplasia of stomach and duodenum with bleeding: Secondary | ICD-10-CM | POA: Diagnosis not present

## 2014-12-25 DIAGNOSIS — Q2733 Arteriovenous malformation of digestive system vessel: Secondary | ICD-10-CM

## 2014-12-25 DIAGNOSIS — I780 Hereditary hemorrhagic telangiectasia: Secondary | ICD-10-CM | POA: Diagnosis not present

## 2014-12-25 DIAGNOSIS — M545 Low back pain: Secondary | ICD-10-CM | POA: Diagnosis not present

## 2014-12-25 DIAGNOSIS — D509 Iron deficiency anemia, unspecified: Secondary | ICD-10-CM | POA: Diagnosis not present

## 2014-12-25 DIAGNOSIS — I5032 Chronic diastolic (congestive) heart failure: Secondary | ICD-10-CM | POA: Diagnosis not present

## 2014-12-25 DIAGNOSIS — D500 Iron deficiency anemia secondary to blood loss (chronic): Secondary | ICD-10-CM | POA: Diagnosis not present

## 2014-12-25 DIAGNOSIS — E1140 Type 2 diabetes mellitus with diabetic neuropathy, unspecified: Secondary | ICD-10-CM | POA: Diagnosis not present

## 2014-12-25 LAB — PREPARE RBC (CROSSMATCH)

## 2014-12-25 MED ORDER — FUROSEMIDE 10 MG/ML IJ SOLN
INTRAMUSCULAR | Status: AC
  Filled 2014-12-25: qty 2

## 2014-12-25 MED ORDER — ONETOUCH VERIO W/DEVICE KIT: 1.0000 | PACK | Freq: Three times a day (TID) | Status: DC

## 2014-12-25 MED ORDER — GLUCOSE BLOOD VI STRP: ORAL_STRIP | Status: DC

## 2014-12-25 MED ORDER — SODIUM CHLORIDE 0.9 % IV SOLN
250.0000 mL | Freq: Once | INTRAVENOUS | Status: AC
  Administered 2014-12-25: 250 mL via INTRAVENOUS

## 2014-12-25 MED ORDER — ONETOUCH DELICA LANCETS FINE MISC: Status: DC

## 2014-12-25 MED ORDER — SODIUM CHLORIDE 0.9 % IV SOLN: Freq: Once | INTRAVENOUS | Status: DC

## 2014-12-25 MED ORDER — HEPARIN SOD (PORK) LOCK FLUSH 100 UNIT/ML IV SOLN
500.0000 [IU] | Freq: Every day | INTRAVENOUS | Status: AC | PRN
  Administered 2014-12-25: 500 [IU]
  Filled 2014-12-25: qty 5

## 2014-12-25 MED ORDER — FUROSEMIDE 10 MG/ML IJ SOLN
20.0000 mg | Freq: Once | INTRAMUSCULAR | Status: AC
  Administered 2014-12-25: 20 mg via INTRAVENOUS

## 2014-12-25 MED ORDER — SODIUM CHLORIDE 0.9 % IJ SOLN
10.0000 mL | INTRAMUSCULAR | Status: AC | PRN
  Administered 2014-12-25: 10 mL
  Filled 2014-12-25: qty 10

## 2014-12-25 NOTE — Patient Instructions (Signed)
Blood Transfusion  A blood transfusion is a procedure in which you receive donated blood through an IV tube. You may need a blood transfusion because of illness, surgery, or injury. The blood may come from a donor, or it may be your own blood that you donated previously. The blood given in a transfusion is made up of different types of cells. You may receive:  Red blood cells. These carry oxygen and replace lost blood.  Platelets. These control bleeding.  Plasma. Thishelps blood to clot. If you have hemophilia or another clotting disorder, you may also receive other types of blood products. LET [facility] CARE PROVIDER KNOW ABOUT:  Any allergies you have.  All medicines you are taking, including vitamins, herbs, eye drops, creams, and over-the-counter medicines.  Previous problems you or members of your family have had with the use of anesthetics.  Any blood disorders you have.  Previous surgeries you have had.  Any medical conditions you may have.  Any previous reactions you have had during a blood transfusion.  RISKS AND COMPLICATIONS Generally, this is a safe procedure. However, problems may occur, including:  Having an allergic reaction to something in the donated blood.  Fever. This may be a reaction to the white blood cells in the transfused blood.  Iron overload. This can happen from having many transfusions.  Transfusion-related acute lung injury (TRALI). This is a rare reaction that causes lung damage. The cause is not known.TRALI can occur within hours of a transfusion or several days later.  Sudden (acute) or delayed hemolytic reactions. This happens if your blood does not match the cells in your transfusion. Your body's defense system (immune system) may try to attack the new cells. This complication is rare.  Infection. This is rare. BEFORE THE PROCEDURE  You may have a blood test to determine your blood type. This is necessary to know what kind of blood your  body will accept.  If you are going to have a planned surgery, you may donate your own blood. This may be done in case you need to have a transfusion.  If you have had an allergic reaction to a transfusion in the past, you may be given medicine to help prevent a reaction. Take this medicine only as directed by your health care provider.  You will have your temperature, blood pressure, and pulse monitored before the transfusion. PROCEDURE   An IV will be started in your hand or arm.  The bag of donated blood will be attached to your IV tube and given into your vein.  Your temperature, blood pressure, and pulse will be monitored regularly during the transfusion. This monitoring is done to detect early signs of a transfusion reaction.  If you have any signs or symptoms of a reaction, your transfusion will be stopped and you may be given medicine.  When the transfusion is over, your IV will be removed.  Pressure may be applied to the IV site for a few minutes.  A bandage (dressing) will be applied. The procedure may vary among health care providers and hospitals. AFTER THE PROCEDURE  Your blood pressure, temperature, and pulse will be monitored regularly.   This information is not intended to replace advice given to you by your health care provider. Make sure you discuss any questions you have with your health care provider.   Document Released: 01/24/2000 Document Revised: 02/16/2014 Document Reviewed: 12/06/2013 Elsevier Interactive Patient Education 2016 Elsevier Inc.   Furosemide injection What is this medicine?  FUROSEMIDE (fyoor [place] se mide) is a diuretic. It helps you make more urine and to lose salt and excess water. This medicine is used to treat high blood pressure, and edema or swelling from heart, kidney, or liver disease. This medicine may be used for other purposes; ask your health care provider or pharmacist if you have questions. What should I tell my health care  provider before I take this medicine? They need to know if you have any of these conditions: -abnormal blood electrolytes -diarrhea or vomiting -gout -heart disease -kidney disease, small amounts of urine, or difficulty passing urine -liver disease -premature newborn -thyroid disease -an unusual or allergic reaction to furosemide, sulfa drugs, other medicines, foods, dyes, or preservatives -pregnant or trying to get pregnant -breast-feeding How should I use this medicine? This medicine is for injection into a muscle or a vein. It is given by a healthcare professional in a hospital or clinic setting. Talk to your pediatrician regarding the use of this medicine in children. While this drug may be prescribed for selected conditions, precautions do apply. Overdosage: If you think you have taken too much of this medicine contact a poison control center or emergency room at once. NOTE: This medicine is only for you. Do not share this medicine with others. What if I miss a dose? This does not apply. What may interact with this medicine? -aspirin and aspirin-like medicines -certain antibiotics -chloral hydrate -cisplatin -cyclosporine -digoxin -diuretics -laxatives -lithium -medicines for blood pressure -medicines that relax muscles for surgery -methotrexate -NSAIDS, medicines for pain and inflammation like ibuprofen, naproxen, or indomethacin -phenytoin -steroid medicines like prednisone or cortisone -sucralfate -thyroid hormones This list may not describe all possible interactions. Give your health care provider a list of all the medicines, herbs, non-prescription drugs, or dietary supplements you use. Also tell them if you smoke, drink alcohol, or use illegal drugs. Some items may interact with your medicine. What should I watch for while using this medicine? You will be monitored closely while you are on this medicine. This medicine can increase the amount of sugar in blood or  urine. If you are a diabetic your sugar will need to be checked. You may get drowsy or dizzy. Do not drive, use machinery, or do anything that needs mental alertness until you know how this drug affects you. Do not stand or sit up quickly, especially if you are an older patient. This reduces the risk of dizzy or fainting spells. Alcohol can make you more drowsy and dizzy. Avoid alcoholic drinks. This medicine can make you more sensitive to the sun. Keep out of the sun. If you cannot avoid being in the sun, wear protective clothing and use sunscreen. Do not use sun lamps or tanning beds/booths. What side effects may I notice from receiving this medicine? Side effects that you should report to your doctor or health care professional as soon as possible: -blood in urine or stools -dry mouth -fever or chills -hearing loss or ringing in the ears -irregular heartbeat -muscle pain or weakness, cramps -stomach upset, pain, or nausea -tingling or numbness in the hands or feet -unusually weak or tired -vomiting or diarrhea -yellowing of the eyes or skin Side effects that usually do not require medical attention (report to your doctor or health care professional if they continue or are bothersome): -headache -loss of appetite -unusual bleeding or bruising This list may not describe all possible side effects. Call your doctor for medical advice about side effects. You may report  side effects to FDA at 1-800-FDA-1088. Where should I keep my medicine? This drug is given in a hospital or clinic and will not be stored at home. NOTE: This sheet is a summary. It may not cover all possible information. If you have questions about this medicine, talk to your doctor, pharmacist, or health care provider.    2016, Elsevier/Gold Standard. (2014-04-18 13:53:29)

## 2014-12-25 NOTE — Telephone Encounter (Signed)
Tried calling patient two times to let her know that the prescriptions she requested  Were sent. Unable to leave a message and no answer

## 2014-12-25 NOTE — Progress Notes (Signed)
Pt received 2 units prbc today. Pt tolerated blood transfusion well and received 20mg  IV lasix post 2units. AVS printed and appts reviewed with pt. Pt reports feeling improved today after transfusion.

## 2014-12-26 DIAGNOSIS — I5032 Chronic diastolic (congestive) heart failure: Secondary | ICD-10-CM | POA: Diagnosis not present

## 2014-12-26 DIAGNOSIS — E1140 Type 2 diabetes mellitus with diabetic neuropathy, unspecified: Secondary | ICD-10-CM | POA: Diagnosis not present

## 2014-12-26 DIAGNOSIS — M545 Low back pain: Secondary | ICD-10-CM | POA: Diagnosis not present

## 2014-12-26 LAB — TYPE AND SCREEN
ABO/RH(D): A POS
ANTIBODY SCREEN: NEGATIVE
Unit division: 0
Unit division: 0

## 2014-12-27 DIAGNOSIS — I5032 Chronic diastolic (congestive) heart failure: Secondary | ICD-10-CM | POA: Diagnosis not present

## 2014-12-27 DIAGNOSIS — M545 Low back pain: Secondary | ICD-10-CM | POA: Diagnosis not present

## 2014-12-27 DIAGNOSIS — E1140 Type 2 diabetes mellitus with diabetic neuropathy, unspecified: Secondary | ICD-10-CM | POA: Diagnosis not present

## 2014-12-28 DIAGNOSIS — M545 Low back pain: Secondary | ICD-10-CM | POA: Diagnosis not present

## 2014-12-28 DIAGNOSIS — E1140 Type 2 diabetes mellitus with diabetic neuropathy, unspecified: Secondary | ICD-10-CM | POA: Diagnosis not present

## 2014-12-28 DIAGNOSIS — I5032 Chronic diastolic (congestive) heart failure: Secondary | ICD-10-CM | POA: Diagnosis not present

## 2014-12-29 DIAGNOSIS — E1140 Type 2 diabetes mellitus with diabetic neuropathy, unspecified: Secondary | ICD-10-CM | POA: Diagnosis not present

## 2014-12-29 DIAGNOSIS — I5032 Chronic diastolic (congestive) heart failure: Secondary | ICD-10-CM | POA: Diagnosis not present

## 2014-12-29 DIAGNOSIS — M545 Low back pain: Secondary | ICD-10-CM | POA: Diagnosis not present

## 2014-12-30 DIAGNOSIS — M545 Low back pain: Secondary | ICD-10-CM | POA: Diagnosis not present

## 2014-12-30 DIAGNOSIS — I5032 Chronic diastolic (congestive) heart failure: Secondary | ICD-10-CM | POA: Diagnosis not present

## 2014-12-30 DIAGNOSIS — E1140 Type 2 diabetes mellitus with diabetic neuropathy, unspecified: Secondary | ICD-10-CM | POA: Diagnosis not present

## 2014-12-31 DIAGNOSIS — E1140 Type 2 diabetes mellitus with diabetic neuropathy, unspecified: Secondary | ICD-10-CM | POA: Diagnosis not present

## 2014-12-31 DIAGNOSIS — M545 Low back pain: Secondary | ICD-10-CM | POA: Diagnosis not present

## 2014-12-31 DIAGNOSIS — I5032 Chronic diastolic (congestive) heart failure: Secondary | ICD-10-CM | POA: Diagnosis not present

## 2015-01-01 DIAGNOSIS — M545 Low back pain: Secondary | ICD-10-CM | POA: Diagnosis not present

## 2015-01-01 DIAGNOSIS — E1140 Type 2 diabetes mellitus with diabetic neuropathy, unspecified: Secondary | ICD-10-CM | POA: Diagnosis not present

## 2015-01-01 DIAGNOSIS — I5032 Chronic diastolic (congestive) heart failure: Secondary | ICD-10-CM | POA: Diagnosis not present

## 2015-01-02 ENCOUNTER — Encounter: Payer: Self-pay | Admitting: Neurology

## 2015-01-02 DIAGNOSIS — I5032 Chronic diastolic (congestive) heart failure: Secondary | ICD-10-CM | POA: Diagnosis not present

## 2015-01-02 DIAGNOSIS — M545 Low back pain: Secondary | ICD-10-CM | POA: Diagnosis not present

## 2015-01-02 DIAGNOSIS — E1140 Type 2 diabetes mellitus with diabetic neuropathy, unspecified: Secondary | ICD-10-CM | POA: Diagnosis not present

## 2015-01-03 DIAGNOSIS — I5032 Chronic diastolic (congestive) heart failure: Secondary | ICD-10-CM | POA: Diagnosis not present

## 2015-01-03 DIAGNOSIS — E1140 Type 2 diabetes mellitus with diabetic neuropathy, unspecified: Secondary | ICD-10-CM | POA: Diagnosis not present

## 2015-01-03 DIAGNOSIS — M545 Low back pain: Secondary | ICD-10-CM | POA: Diagnosis not present

## 2015-01-04 DIAGNOSIS — E1140 Type 2 diabetes mellitus with diabetic neuropathy, unspecified: Secondary | ICD-10-CM | POA: Diagnosis not present

## 2015-01-04 DIAGNOSIS — I5032 Chronic diastolic (congestive) heart failure: Secondary | ICD-10-CM | POA: Diagnosis not present

## 2015-01-04 DIAGNOSIS — M545 Low back pain: Secondary | ICD-10-CM | POA: Diagnosis not present

## 2015-01-05 DIAGNOSIS — M545 Low back pain: Secondary | ICD-10-CM | POA: Diagnosis not present

## 2015-01-05 DIAGNOSIS — I5032 Chronic diastolic (congestive) heart failure: Secondary | ICD-10-CM | POA: Diagnosis not present

## 2015-01-05 DIAGNOSIS — E1140 Type 2 diabetes mellitus with diabetic neuropathy, unspecified: Secondary | ICD-10-CM | POA: Diagnosis not present

## 2015-01-06 DIAGNOSIS — I5032 Chronic diastolic (congestive) heart failure: Secondary | ICD-10-CM | POA: Diagnosis not present

## 2015-01-06 DIAGNOSIS — M545 Low back pain: Secondary | ICD-10-CM | POA: Diagnosis not present

## 2015-01-06 DIAGNOSIS — E1140 Type 2 diabetes mellitus with diabetic neuropathy, unspecified: Secondary | ICD-10-CM | POA: Diagnosis not present

## 2015-01-07 DIAGNOSIS — I5032 Chronic diastolic (congestive) heart failure: Secondary | ICD-10-CM | POA: Diagnosis not present

## 2015-01-07 DIAGNOSIS — E1140 Type 2 diabetes mellitus with diabetic neuropathy, unspecified: Secondary | ICD-10-CM | POA: Diagnosis not present

## 2015-01-07 DIAGNOSIS — M545 Low back pain: Secondary | ICD-10-CM | POA: Diagnosis not present

## 2015-01-08 ENCOUNTER — Encounter: Payer: Self-pay | Admitting: Dietician

## 2015-01-08 DIAGNOSIS — I5032 Chronic diastolic (congestive) heart failure: Secondary | ICD-10-CM | POA: Diagnosis not present

## 2015-01-08 DIAGNOSIS — M545 Low back pain: Secondary | ICD-10-CM | POA: Diagnosis not present

## 2015-01-08 DIAGNOSIS — E1140 Type 2 diabetes mellitus with diabetic neuropathy, unspecified: Secondary | ICD-10-CM | POA: Diagnosis not present

## 2015-01-09 ENCOUNTER — Telehealth: Payer: Self-pay | Admitting: *Deleted

## 2015-01-09 ENCOUNTER — Ambulatory Visit (HOSPITAL_BASED_OUTPATIENT_CLINIC_OR_DEPARTMENT_OTHER): Payer: Medicare Other

## 2015-01-09 ENCOUNTER — Telehealth: Payer: Self-pay | Admitting: Oncology

## 2015-01-09 ENCOUNTER — Other Ambulatory Visit: Payer: Self-pay | Admitting: Oncology

## 2015-01-09 DIAGNOSIS — K31819 Angiodysplasia of stomach and duodenum without bleeding: Secondary | ICD-10-CM

## 2015-01-09 DIAGNOSIS — I780 Hereditary hemorrhagic telangiectasia: Secondary | ICD-10-CM

## 2015-01-09 DIAGNOSIS — D500 Iron deficiency anemia secondary to blood loss (chronic): Secondary | ICD-10-CM

## 2015-01-09 DIAGNOSIS — I5032 Chronic diastolic (congestive) heart failure: Secondary | ICD-10-CM | POA: Diagnosis not present

## 2015-01-09 DIAGNOSIS — M545 Low back pain: Secondary | ICD-10-CM | POA: Diagnosis not present

## 2015-01-09 DIAGNOSIS — E1140 Type 2 diabetes mellitus with diabetic neuropathy, unspecified: Secondary | ICD-10-CM | POA: Diagnosis not present

## 2015-01-09 DIAGNOSIS — K31811 Angiodysplasia of stomach and duodenum with bleeding: Secondary | ICD-10-CM

## 2015-01-09 LAB — CBC WITH DIFFERENTIAL/PLATELET
BASO%: 0.8 % (ref 0.0–2.0)
BASOS ABS: 0 10*3/uL (ref 0.0–0.1)
EOS ABS: 0.3 10*3/uL (ref 0.0–0.5)
EOS%: 7.3 % — ABNORMAL HIGH (ref 0.0–7.0)
HCT: 26.5 % — ABNORMAL LOW (ref 34.8–46.6)
HEMOGLOBIN: 8.3 g/dL — AB (ref 11.6–15.9)
LYMPH%: 14.6 % (ref 14.0–49.7)
MCH: 27 pg (ref 25.1–34.0)
MCHC: 31.1 g/dL — ABNORMAL LOW (ref 31.5–36.0)
MCV: 86.8 fL (ref 79.5–101.0)
MONO#: 0.4 10*3/uL (ref 0.1–0.9)
MONO%: 9 % (ref 0.0–14.0)
NEUT%: 68.3 % (ref 38.4–76.8)
NEUTROS ABS: 3.2 10*3/uL (ref 1.5–6.5)
PLATELETS: 247 10*3/uL (ref 145–400)
RBC: 3.06 10*6/uL — ABNORMAL LOW (ref 3.70–5.45)
RDW: 18 % — ABNORMAL HIGH (ref 11.2–14.5)
WBC: 4.7 10*3/uL (ref 3.9–10.3)
lymph#: 0.7 10*3/uL — ABNORMAL LOW (ref 0.9–3.3)

## 2015-01-09 LAB — HOLD TUBE, BLOOD BANK

## 2015-01-09 NOTE — Telephone Encounter (Signed)
Called pt - stated she can go to the [facility] today for labs (TXM) per Dr Colin Rhein. Called Dr Darnell Level back - he will [facility].

## 2015-01-09 NOTE — Telephone Encounter (Signed)
PATIENT STOPPED BY FOR LAB. PER 11/30 POF ADDED PATIENT FOR LAB TODAY AND BLOOD TOMORROW. PATIENT HAS APPOINTMENT FOR TOMORROW.

## 2015-01-10 ENCOUNTER — Ambulatory Visit (HOSPITAL_BASED_OUTPATIENT_CLINIC_OR_DEPARTMENT_OTHER): Payer: Medicare Other

## 2015-01-10 ENCOUNTER — Ambulatory Visit (HOSPITAL_COMMUNITY)
Admission: RE | Admit: 2015-01-10 | Discharge: 2015-01-10 | Disposition: A | Discharge status: Home / Self Care | Payer: Medicare Other | Source: Ambulatory Visit | Attending: Oncology | Admitting: Oncology

## 2015-01-10 VITALS — BP 133/42 | HR 61 | Temp 97.8°F | Resp 18

## 2015-01-10 DIAGNOSIS — D500 Iron deficiency anemia secondary to blood loss (chronic): Secondary | ICD-10-CM | POA: Diagnosis not present

## 2015-01-10 DIAGNOSIS — K31811 Angiodysplasia of stomach and duodenum with bleeding: Secondary | ICD-10-CM | POA: Insufficient documentation

## 2015-01-10 DIAGNOSIS — I780 Hereditary hemorrhagic telangiectasia: Secondary | ICD-10-CM | POA: Diagnosis not present

## 2015-01-10 DIAGNOSIS — Q2733 Arteriovenous malformation of digestive system vessel: Secondary | ICD-10-CM | POA: Diagnosis not present

## 2015-01-10 DIAGNOSIS — D649 Anemia, unspecified: Secondary | ICD-10-CM

## 2015-01-10 DIAGNOSIS — E1140 Type 2 diabetes mellitus with diabetic neuropathy, unspecified: Secondary | ICD-10-CM | POA: Diagnosis not present

## 2015-01-10 DIAGNOSIS — I5032 Chronic diastolic (congestive) heart failure: Secondary | ICD-10-CM | POA: Diagnosis not present

## 2015-01-10 DIAGNOSIS — M545 Low back pain: Secondary | ICD-10-CM | POA: Diagnosis not present

## 2015-01-10 LAB — PREPARE RBC (CROSSMATCH)

## 2015-01-10 MED ORDER — FUROSEMIDE 10 MG/ML IJ SOLN
20.0000 mg | Freq: Once | INTRAMUSCULAR | Status: AC
  Administered 2015-01-10: 20 mg via INTRAVENOUS

## 2015-01-10 MED ORDER — SODIUM CHLORIDE 0.9 % IJ SOLN
10.0000 mL | INTRAMUSCULAR | Status: AC | PRN
  Administered 2015-01-10: 10 mL
  Filled 2015-01-10: qty 10

## 2015-01-10 MED ORDER — HEPARIN SOD (PORK) LOCK FLUSH 100 UNIT/ML IV SOLN
500.0000 [IU] | Freq: Every day | INTRAVENOUS | Status: AC | PRN
  Administered 2015-01-10: 500 [IU]
  Filled 2015-01-10: qty 5

## 2015-01-10 MED ORDER — SODIUM CHLORIDE 0.9 % IV SOLN
250.0000 mL | Freq: Once | INTRAVENOUS | Status: AC
  Administered 2015-01-10: 250 mL via INTRAVENOUS

## 2015-01-10 NOTE — Patient Instructions (Signed)

## 2015-01-10 NOTE — Progress Notes (Signed)
BP 133/42, reviewed with Dr. Lindi Adie okay to give Lasix 20mg  per Dr. Lindi Adie. Pt and VS stable at time of discharge.

## 2015-01-11 DIAGNOSIS — E1140 Type 2 diabetes mellitus with diabetic neuropathy, unspecified: Secondary | ICD-10-CM | POA: Diagnosis not present

## 2015-01-11 DIAGNOSIS — I5032 Chronic diastolic (congestive) heart failure: Secondary | ICD-10-CM | POA: Diagnosis not present

## 2015-01-11 DIAGNOSIS — M545 Low back pain: Secondary | ICD-10-CM | POA: Diagnosis not present

## 2015-01-12 DIAGNOSIS — E1140 Type 2 diabetes mellitus with diabetic neuropathy, unspecified: Secondary | ICD-10-CM | POA: Diagnosis not present

## 2015-01-12 DIAGNOSIS — M545 Low back pain: Secondary | ICD-10-CM | POA: Diagnosis not present

## 2015-01-12 DIAGNOSIS — I5032 Chronic diastolic (congestive) heart failure: Secondary | ICD-10-CM | POA: Diagnosis not present

## 2015-01-13 DIAGNOSIS — M545 Low back pain: Secondary | ICD-10-CM | POA: Diagnosis not present

## 2015-01-13 DIAGNOSIS — E1140 Type 2 diabetes mellitus with diabetic neuropathy, unspecified: Secondary | ICD-10-CM | POA: Diagnosis not present

## 2015-01-13 DIAGNOSIS — I5032 Chronic diastolic (congestive) heart failure: Secondary | ICD-10-CM | POA: Diagnosis not present

## 2015-01-14 DIAGNOSIS — E1140 Type 2 diabetes mellitus with diabetic neuropathy, unspecified: Secondary | ICD-10-CM | POA: Diagnosis not present

## 2015-01-14 DIAGNOSIS — I5032 Chronic diastolic (congestive) heart failure: Secondary | ICD-10-CM | POA: Diagnosis not present

## 2015-01-14 DIAGNOSIS — M545 Low back pain: Secondary | ICD-10-CM | POA: Diagnosis not present

## 2015-01-14 LAB — TYPE AND SCREEN
ABO/RH(D): A POS
ANTIBODY SCREEN: NEGATIVE
UNIT DIVISION: 0
Unit division: 0

## 2015-01-15 DIAGNOSIS — I5032 Chronic diastolic (congestive) heart failure: Secondary | ICD-10-CM | POA: Diagnosis not present

## 2015-01-15 DIAGNOSIS — E1140 Type 2 diabetes mellitus with diabetic neuropathy, unspecified: Secondary | ICD-10-CM | POA: Diagnosis not present

## 2015-01-15 DIAGNOSIS — M545 Low back pain: Secondary | ICD-10-CM | POA: Diagnosis not present

## 2015-01-16 ENCOUNTER — Ambulatory Visit: Payer: Medicare Other | Admitting: Neurology

## 2015-01-16 DIAGNOSIS — M545 Low back pain: Secondary | ICD-10-CM | POA: Diagnosis not present

## 2015-01-16 DIAGNOSIS — E1140 Type 2 diabetes mellitus with diabetic neuropathy, unspecified: Secondary | ICD-10-CM | POA: Diagnosis not present

## 2015-01-16 DIAGNOSIS — I5032 Chronic diastolic (congestive) heart failure: Secondary | ICD-10-CM | POA: Diagnosis not present

## 2015-01-17 DIAGNOSIS — M545 Low back pain: Secondary | ICD-10-CM | POA: Diagnosis not present

## 2015-01-17 DIAGNOSIS — I5032 Chronic diastolic (congestive) heart failure: Secondary | ICD-10-CM | POA: Diagnosis not present

## 2015-01-17 DIAGNOSIS — E1140 Type 2 diabetes mellitus with diabetic neuropathy, unspecified: Secondary | ICD-10-CM | POA: Diagnosis not present

## 2015-01-18 ENCOUNTER — Ambulatory Visit (INDEPENDENT_AMBULATORY_CARE_PROVIDER_SITE_OTHER): Payer: Medicare Other | Admitting: Cardiology

## 2015-01-18 ENCOUNTER — Encounter: Payer: Self-pay | Admitting: Cardiology

## 2015-01-18 ENCOUNTER — Ambulatory Visit (INDEPENDENT_AMBULATORY_CARE_PROVIDER_SITE_OTHER): Payer: Medicare Other | Admitting: Internal Medicine

## 2015-01-18 VITALS — BP 130/39 | HR 65 | Temp 98.2°F | Wt 143.1 lb

## 2015-01-18 VITALS — BP 138/60 | HR 68 | Ht 63.0 in | Wt 142.8 lb

## 2015-01-18 DIAGNOSIS — E1140 Type 2 diabetes mellitus with diabetic neuropathy, unspecified: Secondary | ICD-10-CM | POA: Diagnosis not present

## 2015-01-18 DIAGNOSIS — E1165 Type 2 diabetes mellitus with hyperglycemia: Secondary | ICD-10-CM

## 2015-01-18 DIAGNOSIS — I5032 Chronic diastolic (congestive) heart failure: Secondary | ICD-10-CM

## 2015-01-18 DIAGNOSIS — E1129 Type 2 diabetes mellitus with other diabetic kidney complication: Secondary | ICD-10-CM | POA: Diagnosis not present

## 2015-01-18 DIAGNOSIS — I10 Essential (primary) hypertension: Secondary | ICD-10-CM

## 2015-01-18 DIAGNOSIS — IMO0002 Reserved for concepts with insufficient information to code with codable children: Secondary | ICD-10-CM

## 2015-01-18 DIAGNOSIS — R0602 Shortness of breath: Secondary | ICD-10-CM | POA: Insufficient documentation

## 2015-01-18 DIAGNOSIS — M545 Low back pain: Secondary | ICD-10-CM | POA: Diagnosis not present

## 2015-01-18 LAB — GLUCOSE, CAPILLARY: Glucose-Capillary: 177 mg/dL — ABNORMAL HIGH (ref 65–99)

## 2015-01-18 MED ORDER — FUROSEMIDE 20 MG PO TABS: 20.0000 mg | ORAL_TABLET | Freq: Every day | ORAL | Status: DC

## 2015-01-18 NOTE — Patient Instructions (Signed)
Medication Instructions:  Your physician has recommended you make the following change in your medication: 1) START LASIX 20 mg daily  Labwork: IN ONE WEEK: BMET  Testing/Procedures: None  Follow-Up: Your physician wants you to follow-up in: 6 months with Dr. Radford Pax. You will receive a reminder letter in the mail two months in advance. If you don't receive a letter, please call our office to schedule the follow-up appointment.   Any Other Special Instructions Will Be Listed Below (If Applicable).     If you need a refill on your cardiac medications before your next appointment, please call your pharmacy.

## 2015-01-18 NOTE — Assessment & Plan Note (Signed)
Assessment: Patient seen by her cardiologist (Dr. Radford Pax) earlier today.  No shortness of breath, lower extremity edema and appears euvolemic today.  Plan: - Continue metoprolol succinate daily and lasix daily per cardiology recs - Continue outpatient follow up with cardiology

## 2015-01-18 NOTE — Progress Notes (Signed)
Cardiology Office Note   Date:  01/18/2015   ID:  [name], DOB 10-02-1940, MRN [id number]  PCP:  [name], DO    Chief Complaint  Patient presents with  . Congestive Heart Failure    pt has no complaints      History of Present Illness: [name] is a 74 y.o. female who presents for followup of grade II diastolic dysfunction with chronic diastolic CHF. She has a history of Osler-weber-rendu disase, gastric AVMs and chronic iron deficiency anemia requiring transfusion. She also has a history of HTN, dyslipidemia, type II DM and GERD.She denies any chest pain, SOB, DOE, orthopnea, PND, LE edema. She denies any palpitations, dizziness or syncope.    Past Medical History  Diagnosis Date  . History of epistaxis   . Hyperlipidemia   . Telangiectasia     Gastric   . Lichen planus     Both lower extremities  . GERD (gastroesophageal reflux disease)   . Heart murmur   . CHF (congestive heart failure) ([place])   . History of blood transfusion "several"  . Chronic diastolic CHF (congestive heart failure) ([place]) 10/03/2013  . HTN (hypertension), benign 03/02/2012  . Pneumonia 1990's X 2  . Family history of anesthesia complication     "niece has a hard time coming out" (09/15/2012)  . Type II diabetes mellitus ([name]     insulin requiring.  . Seizures ([place]) 09/2014  . Frequent nosebleeds     chronic  . Chronic GI bleeding     [name] 11/29/2014  . Iron deficiency anemia     chronic infusions"  . Chronic anemia   . 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 Gastric AV malformation     /notes 11/29/2014    Past Surgical History  Procedure Laterality Date  . Nasal hemorrhage control      "for bleeding"   . Savory dilation  02/26/2011    Procedure: SAVORY DILATION;  Surgeon: [name], MD;  Location: [name];  Service: Endoscopy;  Laterality: N/A;  c-arm needed  . Esophagogastroduodenoscopy  02/26/2011    Procedure:  ESOPHAGOGASTRODUODENOSCOPY (EGD);  Surgeon: [name], MD;  Location: [name];  Service: Endoscopy;  Laterality: N/A;  . Esophagogastroduodenoscopy N/A 11/08/2012    Procedure: ESOPHAGOGASTRODUODENOSCOPY (EGD);  Surgeon: [name], MD;  Location: [name];  Service: Endoscopy;  Laterality: N/A;  . Cataract extraction      "I think it was just one eye"  . Esophagogastroduodenoscopy N/A 10/04/2013    Procedure: ESOPHAGOGASTRODUODENOSCOPY (EGD);  Surgeon: Winfield Cunas., MD;  Location: [name];  Service: Endoscopy;  Laterality: N/A;  with APC on stand-by  . Esophagogastroduodenoscopy N/A 07/06/2014    Procedure: ESOPHAGOGASTRODUODENOSCOPY (EGD);  Surgeon: Clarene Essex, MD;  Location: [name];  Service: Endoscopy;  Laterality: N/A;  . Hot hemostasis N/A 07/06/2014    Procedure: HOT HEMOSTASIS (ARGON PLASMA COAGULATION/BICAP);  Surgeon: Clarene Essex, MD;  Location: [name];  Service: Endoscopy;  Laterality: N/A;  . Esophagogastroduodenoscopy N/A 09/05/2014    Procedure: ESOPHAGOGASTRODUODENOSCOPY (EGD);  Surgeon: Laurence Spates, MD;  Location: [name];  Service: Endoscopy;  Laterality: N/A;  APC on standby to control bleeding  . Esophagogastroduodenoscopy endoscopy  08/19/2006    with laser treatment  . Esophagogastroduodenoscopy N/A 11/29/2014    Procedure: ESOPHAGOGASTRODUODENOSCOPY (EGD);  Surgeon: [name], MD;  Location: [facility] ENDOSCOPY;  Service: Endoscopy;  Laterality: N/A;     Current Outpatient Prescriptions  Medication Sig Dispense Refill  . AMITIZA 24 MCG capsule Take 24 mcg by mouth 2 (two) times daily.    Marland Kitchen amitriptyline (ELAVIL) 25 MG tablet Take 25 mg by mouth daily.    . Blood Glucose Monitoring Suppl (ONETOUCH VERIO) W/DEVICE KIT 1 each by Does not apply route 3 (three) times daily. 1 kit 1  . ferrous fumarate (HEMOCYTE - 106 MG FE) 325 (106 FE) MG TABS tablet Take 1 tablet by mouth daily.    . furosemide (LASIX) 40 MG tablet Take 0.5 tablets  (20 mg total) by mouth daily.    Marland Kitchen glucose blood (ONETOUCH VERIO) test strip Check blood sugar three times a day 100 each 5  . insulin detemir (LEVEMIR) 100 UNIT/ML injection Inject 0.3 mLs (30 Units total) into the skin at bedtime. (Patient taking differently: Inject 30 Units into the skin 2 (two) times daily. ) 10 mL 11  . lactulose (CHRONULAC) 10 GM/15ML solution Take 15 mLs (10 g total) by mouth 2 (two) times daily. 240 mL 0  . levETIRAcetam (KEPPRA) 500 MG tablet Take 1 tablet (500 mg total) by mouth 2 (two) times daily. 60 tablet 6  . metoprolol succinate (TOPROL XL) 25 MG 24 hr tablet Take 0.5 tablets (12.5 mg total) by mouth daily. 30 tablet 11  . ONETOUCH DELICA LANCETS FINE MISC Check blood sugar three times a day 100 each 5  . oxymetazoline (AFRIN) 0.05 % nasal spray Place 1 spray into both nostrils 2 (two) times daily as needed (Epistaxis). 30 mL 0  . pantoprazole (PROTONIX) 40 MG tablet Take 1 tablet (40 mg total) by mouth 2 (two) times daily. 60 tablet 1  . traMADol (ULTRAM) 50 MG tablet Take 1 tablet (50 mg total) by mouth every 6 (six) hours as needed. 30 tablet 0   No current facility-administered medications for this visit.    Allergies:   Aspirin    Social History:  The patient  reports that she quit smoking about 43 years ago. Her smoking use included Cigarettes. She has a 20 pack-year smoking history. She has never used smokeless tobacco. She reports that she does not drink alcohol or use illicit drugs.   Family History:  The patient's family history includes Breast cancer in an other family member; Stroke in her father. There is no history of Malignant hyperthermia or Seizures.    ROS:  Please see the history of present illness.   Otherwise, review of systems are positive for none.   All other systems are reviewed and negative.    PHYSICAL EXAM: VS:  BP 138/60 mmHg  Pulse 68  Ht $R'5\' 3"'xp$  (1.6 m)  Wt 142 lb 12.8 oz (64.774 kg)  BMI 25.30 kg/m2  SpO2 96% , BMI Body  mass index is 25.3 kg/(m^2). GEN: Well nourished, well developed, in no acute distress HEENT: normal Neck: no JVD, carotid bruits, or masses Cardiac: RRR; no  rubs, or gallops,no edema.  2/6 SM at RUSB that radiates into her carotid arteries and LLSB Respiratory:  clear to auscultation bilaterally, normal work of breathing GI: soft, nontender, nondistended, + BS MS: no deformity or atrophy Skin: warm and dry, no rash Neuro:  Strength and sensation are intact Psych: euthymic mood, full affect   EKG:  EKG is not ordered today.    Recent Labs: 07/27/2014: Pro B Natriuretic peptide (BNP) 866.0* 09/05/2014: TSH 1.238 10/03/2014: B Natriuretic Peptide 100.0 11/29/2014: ALT  15; Magnesium 1.6* 11/30/2014: BUN 9; Creatinine, Ser 0.80; Potassium 3.9; Sodium 143 01/09/2015: HGB 8.3*; Platelets 247    Lipid Panel    Component Value Date/Time   CHOL 141 11/29/2014 1026   TRIG 141 11/29/2014 1026   HDL 31* 11/29/2014 1026   CHOLHDL 4.5 11/29/2014 1026   VLDL 28 11/29/2014 1026   LDLCALC 82 11/29/2014 1026      Wt Readings from Last 3 Encounters:  01/18/15 142 lb 12.8 oz (64.774 kg)  12/21/14 136 lb 11.2 oz (62.007 kg)  12/07/14 141 lb 14.4 oz (64.365 kg)     ASSESSMENT AND PLAN:  1. Chronic diastolic CHF with severe asymmetric septal hypertrophy with grade 2 DD and normal LVF. She has no SOB or LE edema.  She appears euvolemic on exam today.Continue  Toprol. Refill Lasix - she ran out and did not get it refilled.  2. HTN - BP well controlled.  Continue BB.   3. SOB most likely secondary to poorly controlled HTN in setting of grade 2 DD. This has resolved on BB and refill diuretic.  4.  Systolic heart murmur with mild LVOT gradient and ASSM by echo 06/2014    Current medicines are reviewed at length with the patient today.  The patient does not have concerns regarding medicines.  The following changes have been made:  no change  Labs/ tests ordered today: See above  Assessment and Plan No orders of the defined types were placed in this encounter.     Disposition:   FU with me in 6 months  Signed, [name], MD  01/18/2015 10:47 AM    [facility] [place] [address], [place], [place]  [address] Phone: [phone]; Fax: [phone]

## 2015-01-18 NOTE — Patient Instructions (Signed)
Ms. Teachman,  It was a pleasure seeing you today.  Keep up the great work with your medicines.  Please look for your glucose meter when you get home.  If you cannot find it, please call our clinic at [phone] and let them know.  That way I can order you another one!  Please schedule an appointment with our diabetes educator, Debera Lat.  She can help discuss healthy eating to manage your diabetes better.  Please schedule follow up with me in 2 months.  Remember to check your blood sugar 2 times per day before meals.  Any reading less than 70 and you need to call us!  Take care, Dr. Juleen China

## 2015-01-18 NOTE — Assessment & Plan Note (Signed)
Assessment: Patient with CBG today of 177 and no recent symptoms of hyper or hypoglycemia.  States she lost her meter a couple weeks back and has been using her husbands meter to check glucose bid.  States they are normally in the 200-300s.  Her last A1C was 5.6 in October 2016  Plan: - instructed patient to look for meter at home, if unable to find will call the clinic and we can place order for new meter - check blood sugar bid before meals and bring meter and log to each office visit - continue Levemir 30units bid.  I am hesitant to make changes to insulin based on her subjective reporting of her CBGs.  I feel it will be more helpful for patient to bring meter back at next visit around time she is due for A1C check - rtc in 2 months for repeat A1C - referral placed to [facility], diabetes educator, to help with nutrition planning.  Patient has also lost about 10 pounds in 6 months.

## 2015-01-18 NOTE — Progress Notes (Signed)
Patient ID: [name], female   DOB: 1940-04-08, 74 y.o.   MRN: [id number]   Subjective:   Patient ID: [name] female   DOB: Aug 17, 1940 74 y.o.   MRN: [id number]  HPI: [name] is a 74 y.o. female with past medical history as outlined below who presents to [name] today for follow up of her DM2.  Please see assessment and plan for status of patients chronic medical conditions.    Past Medical History  Diagnosis Date  . History of epistaxis   . Hyperlipidemia   . Telangiectasia     Gastric   . Lichen planus     Both lower extremities  . GERD (gastroesophageal reflux disease)   . Heart murmur   . CHF (congestive heart failure) ([place])   . History of blood transfusion "several"  . Chronic diastolic CHF (congestive heart failure) ([place]) 10/03/2013  . HTN (hypertension), benign 03/02/2012  . Pneumonia 1990's X 2  . Family history of anesthesia complication     "niece has a hard time coming out" (09/15/2012)  . Type II diabetes mellitus ([name]     insulin requiring.  . Seizures ([place]) 09/2014  . Frequent nosebleeds     chronic  . Chronic GI bleeding     [name] 11/29/2014  . Iron deficiency anemia     chronic infusions"  . Chronic anemia   . 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 Gastric AV malformation     /notes 11/29/2014   Current Outpatient Prescriptions  Medication Sig Dispense Refill  . AMITIZA 24 MCG capsule Take 24 mcg by mouth 2 (two) times daily.    . Blood Glucose Monitoring Suppl (ONETOUCH VERIO) W/DEVICE KIT 1 each by Does not apply route 3 (three) times daily. 1 kit 1  . ferrous fumarate (HEMOCYTE - 106 MG FE) 325 (106 FE) MG TABS tablet Take 1 tablet by mouth daily.    . furosemide (LASIX) 20 MG tablet Take 1 tablet (20 mg total) by mouth daily. 30 tablet 11  . glucose blood (ONETOUCH VERIO) test strip Check blood sugar three times a day 100 each 5  . insulin detemir (LEVEMIR) 100 UNIT/ML injection Inject 0.3 mLs (30 Units total) into  the skin at bedtime. (Patient taking differently: Inject 30 Units into the skin 2 (two) times daily. ) 10 mL 11  . levETIRAcetam (KEPPRA) 500 MG tablet Take 1 tablet (500 mg total) by mouth 2 (two) times daily. 60 tablet 6  . metoprolol succinate (TOPROL XL) 25 MG 24 hr tablet Take 0.5 tablets (12.5 mg total) by mouth daily. 30 tablet 11  . ONETOUCH DELICA LANCETS FINE MISC Check blood sugar three times a day 100 each 5  . oxymetazoline (AFRIN) 0.05 % nasal spray Place 1 spray into both nostrils 2 (two) times daily as needed (Epistaxis). 30 mL 0  . pantoprazole (PROTONIX) 40 MG tablet Take 1 tablet (40 mg total) by mouth 2 (two) times daily. (Patient taking differently: Take 40 mg by mouth daily. ) 60 tablet 1   No current facility-administered medications for this visit.   Family History  Problem Relation Age of Onset  . Breast cancer    . Malignant hyperthermia Neg Hx   . Seizures Neg Hx   . Stroke Father    Social History   Social History  . Marital Status: Married    Spouse Name: [name]  . Number of Children: 0  . Years of Education:  9   Occupational History  . Retired Engineer, manufacturing systems    Social History Main Topics  . Smoking status: Former Smoker -- 1.00 packs/day for 20 years    Types: Cigarettes    Quit date: 02/10/1971  . Smokeless tobacco: Never Used     Comment: 09/15/2012 "smoked 50-60 yr ago"  . Alcohol Use: No  . Drug Use: No  . Sexual Activity: No   Other Topics Concern  . Not on file   Social History Narrative   Married, lives with husband, [name].  Ambulates without assistance.     Caffeine use: none   Review of Systems: Review of Systems  Constitutional: Positive for weight loss. Negative for fever and chills.  HENT: Negative for sore throat.   Eyes: Negative for blurred vision and pain.  Respiratory: Negative for cough and shortness of breath.   Cardiovascular: Negative for chest pain and leg swelling.  Gastrointestinal: Negative for nausea, vomiting,  diarrhea and constipation.  Genitourinary: Negative for dysuria.  Musculoskeletal: Negative for back pain and falls.  Skin: Negative for rash.  Neurological: Positive for tremors. Negative for dizziness, tingling and headaches.  Psychiatric/Behavioral: The patient is nervous/anxious.     Objective:  Physical Exam: Filed Vitals:   01/18/15 1431  BP: 130/39  Pulse: 65  Temp: 98.2 F (36.8 C)  TempSrc: Oral  Weight: 143 lb 1.6 oz (64.91 kg)  SpO2: 100%   Physical Exam  Constitutional: She is oriented to person, place, and time. She appears well-developed and well-nourished.  HENT:  Head: Normocephalic and atraumatic.  Eyes: EOM are normal.  Neck: Normal range of motion.  Cardiovascular: Normal rate, regular rhythm and intact distal pulses.   Murmur (2/6 systolic murmur best appreciated Right upper sternal border) heard. Pulmonary/Chest: Effort normal and breath sounds normal.  Abdominal: Soft. There is no tenderness.  Musculoskeletal: Normal range of motion. She exhibits no edema.  Neurological: She is alert and oriented to person, place, and time.  Skin: Skin is warm and dry.  Psychiatric: She has a normal mood and affect.    Assessment & Plan:   Please see problem list for assessment and plan.  Case discussed with Dr. Lynnae January

## 2015-01-19 DIAGNOSIS — M545 Low back pain: Secondary | ICD-10-CM | POA: Diagnosis not present

## 2015-01-19 DIAGNOSIS — E1140 Type 2 diabetes mellitus with diabetic neuropathy, unspecified: Secondary | ICD-10-CM | POA: Diagnosis not present

## 2015-01-19 DIAGNOSIS — I5032 Chronic diastolic (congestive) heart failure: Secondary | ICD-10-CM | POA: Diagnosis not present

## 2015-01-20 DIAGNOSIS — E1140 Type 2 diabetes mellitus with diabetic neuropathy, unspecified: Secondary | ICD-10-CM | POA: Diagnosis not present

## 2015-01-20 DIAGNOSIS — M545 Low back pain: Secondary | ICD-10-CM | POA: Diagnosis not present

## 2015-01-20 DIAGNOSIS — I5032 Chronic diastolic (congestive) heart failure: Secondary | ICD-10-CM | POA: Diagnosis not present

## 2015-01-20 NOTE — Progress Notes (Signed)
Internal Medicine Clinic Attending  I saw and evaluated the patient.  I personally confirmed the key portions of the history and exam documented by Dr. Wallace and I reviewed pertinent patient test results.  The assessment, diagnosis, and plan were formulated together and I agree with the documentation in the resident's note. 

## 2015-01-21 DIAGNOSIS — M545 Low back pain: Secondary | ICD-10-CM | POA: Diagnosis not present

## 2015-01-21 DIAGNOSIS — I5032 Chronic diastolic (congestive) heart failure: Secondary | ICD-10-CM | POA: Diagnosis not present

## 2015-01-21 DIAGNOSIS — E1140 Type 2 diabetes mellitus with diabetic neuropathy, unspecified: Secondary | ICD-10-CM | POA: Diagnosis not present

## 2015-01-22 DIAGNOSIS — M545 Low back pain: Secondary | ICD-10-CM | POA: Diagnosis not present

## 2015-01-22 DIAGNOSIS — E1140 Type 2 diabetes mellitus with diabetic neuropathy, unspecified: Secondary | ICD-10-CM | POA: Diagnosis not present

## 2015-01-22 DIAGNOSIS — I5032 Chronic diastolic (congestive) heart failure: Secondary | ICD-10-CM | POA: Diagnosis not present

## 2015-01-23 DIAGNOSIS — E1140 Type 2 diabetes mellitus with diabetic neuropathy, unspecified: Secondary | ICD-10-CM | POA: Diagnosis not present

## 2015-01-23 DIAGNOSIS — M545 Low back pain: Secondary | ICD-10-CM | POA: Diagnosis not present

## 2015-01-23 DIAGNOSIS — I5032 Chronic diastolic (congestive) heart failure: Secondary | ICD-10-CM | POA: Diagnosis not present

## 2015-01-24 DIAGNOSIS — I5032 Chronic diastolic (congestive) heart failure: Secondary | ICD-10-CM | POA: Diagnosis not present

## 2015-01-24 DIAGNOSIS — M545 Low back pain: Secondary | ICD-10-CM | POA: Diagnosis not present

## 2015-01-24 DIAGNOSIS — E1140 Type 2 diabetes mellitus with diabetic neuropathy, unspecified: Secondary | ICD-10-CM | POA: Diagnosis not present

## 2015-01-25 ENCOUNTER — Other Ambulatory Visit: Payer: Medicare Other

## 2015-01-25 ENCOUNTER — Telehealth: Payer: Self-pay | Admitting: Internal Medicine

## 2015-01-25 DIAGNOSIS — E1140 Type 2 diabetes mellitus with diabetic neuropathy, unspecified: Secondary | ICD-10-CM | POA: Diagnosis not present

## 2015-01-25 DIAGNOSIS — M545 Low back pain: Secondary | ICD-10-CM | POA: Diagnosis not present

## 2015-01-25 DIAGNOSIS — I5032 Chronic diastolic (congestive) heart failure: Secondary | ICD-10-CM | POA: Diagnosis not present

## 2015-01-25 NOTE — Telephone Encounter (Signed)
LOV WITH dR. [place] on 12/04/2014 being faxed

## 2015-01-26 DIAGNOSIS — M545 Low back pain: Secondary | ICD-10-CM | POA: Diagnosis not present

## 2015-01-26 DIAGNOSIS — I5032 Chronic diastolic (congestive) heart failure: Secondary | ICD-10-CM | POA: Diagnosis not present

## 2015-01-26 DIAGNOSIS — E1140 Type 2 diabetes mellitus with diabetic neuropathy, unspecified: Secondary | ICD-10-CM | POA: Diagnosis not present

## 2015-01-27 DIAGNOSIS — E1140 Type 2 diabetes mellitus with diabetic neuropathy, unspecified: Secondary | ICD-10-CM | POA: Diagnosis not present

## 2015-01-27 DIAGNOSIS — M545 Low back pain: Secondary | ICD-10-CM | POA: Diagnosis not present

## 2015-01-27 DIAGNOSIS — I5032 Chronic diastolic (congestive) heart failure: Secondary | ICD-10-CM | POA: Diagnosis not present

## 2015-01-28 ENCOUNTER — Other Ambulatory Visit (INDEPENDENT_AMBULATORY_CARE_PROVIDER_SITE_OTHER): Payer: Medicare Other | Admitting: *Deleted

## 2015-01-28 ENCOUNTER — Encounter: Payer: Self-pay | Admitting: *Deleted

## 2015-01-28 ENCOUNTER — Telehealth: Payer: Self-pay

## 2015-01-28 DIAGNOSIS — I10 Essential (primary) hypertension: Secondary | ICD-10-CM | POA: Diagnosis not present

## 2015-01-28 DIAGNOSIS — M545 Low back pain: Secondary | ICD-10-CM | POA: Diagnosis not present

## 2015-01-28 DIAGNOSIS — I5032 Chronic diastolic (congestive) heart failure: Secondary | ICD-10-CM

## 2015-01-28 DIAGNOSIS — E1140 Type 2 diabetes mellitus with diabetic neuropathy, unspecified: Secondary | ICD-10-CM | POA: Diagnosis not present

## 2015-01-28 LAB — BASIC METABOLIC PANEL
BUN: 16 mg/dL (ref 7–25)
CALCIUM: 8.7 mg/dL (ref 8.6–10.4)
CO2: 27 mmol/L (ref 20–31)
CREATININE: 0.92 mg/dL (ref 0.60–0.93)
Chloride: 107 mmol/L (ref 98–110)
Glucose, Bld: 135 mg/dL — ABNORMAL HIGH (ref 65–99)
Potassium: 3.5 mmol/L (ref 3.5–5.3)
Sodium: 140 mmol/L (ref 135–146)

## 2015-01-28 MED ORDER — POTASSIUM CHLORIDE CRYS ER 20 MEQ PO TBCR: 20.0000 meq | EXTENDED_RELEASE_TABLET | Freq: Every day | ORAL | Status: DC

## 2015-01-28 NOTE — Telephone Encounter (Signed)
-----   Message from [name], MD sent at 01/28/2015 12:59 PM EST ----- Add Kdur 104meq daily and recheck BMET in 1 week

## 2015-01-28 NOTE — Telephone Encounter (Signed)
Informed patient of results and verbal understanding expressed.  Instructed patient to START KDUR 20 meq daily. Patient requests Rx for labwork to be drawn at facility of her choice. Lab order mailed. Patient understands to have labs drawn next week.

## 2015-01-29 DIAGNOSIS — M545 Low back pain: Secondary | ICD-10-CM | POA: Diagnosis not present

## 2015-01-29 DIAGNOSIS — I5032 Chronic diastolic (congestive) heart failure: Secondary | ICD-10-CM | POA: Diagnosis not present

## 2015-01-29 DIAGNOSIS — E1140 Type 2 diabetes mellitus with diabetic neuropathy, unspecified: Secondary | ICD-10-CM | POA: Diagnosis not present

## 2015-01-30 ENCOUNTER — Encounter: Payer: Self-pay | Admitting: Neurology

## 2015-01-30 ENCOUNTER — Ambulatory Visit (INDEPENDENT_AMBULATORY_CARE_PROVIDER_SITE_OTHER): Payer: Medicare Other | Admitting: Neurology

## 2015-01-30 VITALS — BP 150/72 | HR 69 | Ht 63.0 in | Wt 141.0 lb

## 2015-01-30 DIAGNOSIS — E7220 Disorder of urea cycle metabolism, unspecified: Secondary | ICD-10-CM | POA: Diagnosis not present

## 2015-01-30 DIAGNOSIS — R5382 Chronic fatigue, unspecified: Secondary | ICD-10-CM

## 2015-01-30 DIAGNOSIS — R413 Other amnesia: Secondary | ICD-10-CM | POA: Insufficient documentation

## 2015-01-30 DIAGNOSIS — E1140 Type 2 diabetes mellitus with diabetic neuropathy, unspecified: Secondary | ICD-10-CM | POA: Diagnosis not present

## 2015-01-30 DIAGNOSIS — R569 Unspecified convulsions: Secondary | ICD-10-CM | POA: Diagnosis not present

## 2015-01-30 DIAGNOSIS — I5032 Chronic diastolic (congestive) heart failure: Secondary | ICD-10-CM | POA: Diagnosis not present

## 2015-01-30 DIAGNOSIS — M545 Low back pain: Secondary | ICD-10-CM | POA: Diagnosis not present

## 2015-01-30 DIAGNOSIS — F0390 Unspecified dementia without behavioral disturbance: Secondary | ICD-10-CM | POA: Insufficient documentation

## 2015-01-30 MED ORDER — LEVETIRACETAM 500 MG PO TABS: 500.0000 mg | ORAL_TABLET | Freq: Two times a day (BID) | ORAL | Status: DC

## 2015-01-30 MED ORDER — DONEPEZIL HCL 10 MG PO TABS: 10.0000 mg | ORAL_TABLET | Freq: Every day | ORAL | Status: DC

## 2015-01-30 NOTE — Progress Notes (Signed)
[facility]    Provider:  Dr Jaynee Eagles Referring Provider: Nolene Ebbs, MD Primary Care Physician:  [name], DO  CC:  Seizures and memory loss  Interval History: She is more forgetful. She forgets things she says, by the time she tella her husband something she has forgotten. They live independently. Pays all the bills, not skipping bills. No accidents in the home. She is losing things. Misplacing. Forgets appointments. Difficulty with organization more. Can't get herself together. She sleeps a lot. No dementia, no alzheimers in the family.   HPI: [name] is a 74 y.o. female here as a referral from Dr. Jeanie Cooks for seizure in the setting of hyperammonemia. She has a past medical history of hypertension, congestive heart failure, diabetes, acute encephalopathy with status epilepticus, acute renal failure superimposed on stage III chronic kidney disease, urinary tract infections, hyperammonemia.  Patient here with husband. She was admitted with an episode of seizure-like activity. Husband provides assist of the information. He describes the incident. She was shaking her hands and head, trying to catch her breath. All information provided by husband. Lasted a few minutes. She was not responsive. It was on a Tuesday. Then she slept for 5 hours afterwards. Then she vomited while sleeping and then they called 911 and went to the emergency room. This is not the first time it has happened. It has happened in the past. Husband had seen the jerking of the limbs and then she would sleep a while and wake up. He says he has seen in twice in the last year. No family history of seizures. She is much better now. Therapy is coming to her house. Not having any side effects from the medication. She has had episodes of AMS and confusion in the past.   Reviewed notes, labs and imaging from outside physicians, which showed:  MRI of the brain 08/2014 personally reviewed images and agree  with following report. : 1. No acute infarct identified. 2. Increased basal ganglia intrinsic T1 signal which can be seen with hepatic insufficiency, cirrhosis, portosystemic shunt, portal vein thrombosis, total parenteral nutrition, and is less often seen with hyperglycemia or other metabolic disturbances such as Wilson's disease. Correlation with serum ammonia levels and liver function tests is recommended. 3. Advanced but nonspecific cerebral white matter signal changes. Most commonly this is due to chronic small vessel disease. Other considerations include hypercoagulable state, vasculitis, , prior infection or demyelination.  CBC showed anemia, anemia while hospitalized 327. Hepatic function was normal. One month previous she had a GI bleed. Lactulose was started for elevated ammonia. An EEG was ordered which was concerning for frontal status epilepticus. Hyperammonemia was thought to be the driver of the seizures however she was started on Keppra. She was discharged with the diagnosis of acute encephalopathy and hyperammonemia, she was discharged on Keppra 500 mg twice daily.  Patient presented to the emergency room in late August with altered mental status and lethargy. CT of the head imaging was unremarkable patient was afebrile, no leukocytosis, ammonia was 327, BUN was 32 and creatinine was 1.38.  Review of Systems: Patient complains of symptoms per HPI as well as the following symptoms: No CP, No SOB. Pertinent negatives per HPI. All others negative.   Social History   Social History  . Marital Status: Married    Spouse Name: [name]  . Number of Children: 0  . Years of Education: 9   Occupational History  . Retired Engineer, manufacturing systems    Social History  Main Topics  . Smoking status: Former Smoker -- 1.00 packs/day for 20 years    Types: Cigarettes    Quit date: 02/10/1971  . Smokeless tobacco: Never Used     Comment: 09/15/2012 "smoked 50-60 yr ago"  . Alcohol Use: No  . Drug Use:  No  . Sexual Activity: No   Other Topics Concern  . Not on file   Social History Narrative   Married, lives with husband, [name].  Ambulates without assistance.     Caffeine use: none    Family History  Problem Relation Age of Onset  . Breast cancer    . Malignant hyperthermia Neg Hx   . Seizures Neg Hx   . Stroke Father     Past Medical History  Diagnosis Date  . History of epistaxis   . Hyperlipidemia   . Telangiectasia     Gastric   . Lichen planus     Both lower extremities  . GERD (gastroesophageal reflux disease)   . Heart murmur   . CHF (congestive heart failure) ([place])   . History of blood transfusion "several"  . Chronic diastolic CHF (congestive heart failure) ([place]) 10/03/2013  . HTN (hypertension), benign 03/02/2012  . Pneumonia 1990's X 2  . Family history of anesthesia complication     "niece has a hard time coming out" (09/15/2012)  . Type II diabetes mellitus ([name]     insulin requiring.  . Seizures ([place]) 09/2014  . Frequent nosebleeds     chronic  . Chronic GI bleeding     [name] 11/29/2014  . Iron deficiency anemia     chronic infusions"  . Chronic anemia   . 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 Gastric AV malformation     /notes 11/29/2014    Past Surgical History  Procedure Laterality Date  . Nasal hemorrhage control      "for bleeding"   . Savory dilation  02/26/2011    Procedure: SAVORY DILATION;  Surgeon: [name], MD;  Location: [name];  Service: Endoscopy;  Laterality: N/A;  c-arm needed  . Esophagogastroduodenoscopy  02/26/2011    Procedure: ESOPHAGOGASTRODUODENOSCOPY (EGD);  Surgeon: [name], MD;  Location: [name];  Service: Endoscopy;  Laterality: N/A;  . Esophagogastroduodenoscopy N/A 11/08/2012    Procedure: ESOPHAGOGASTRODUODENOSCOPY (EGD);  Surgeon: [name], MD;  Location: [name];  Service: Endoscopy;  Laterality: N/A;  . Cataract extraction      "I think it was just one eye"  .  Esophagogastroduodenoscopy N/A 10/04/2013    Procedure: ESOPHAGOGASTRODUODENOSCOPY (EGD);  Surgeon: Winfield Cunas., MD;  Location: [name];  Service: Endoscopy;  Laterality: N/A;  with APC on stand-by  . Esophagogastroduodenoscopy N/A 07/06/2014    Procedure: ESOPHAGOGASTRODUODENOSCOPY (EGD);  Surgeon: Clarene Essex, MD;  Location: [name];  Service: Endoscopy;  Laterality: N/A;  . Hot hemostasis N/A 07/06/2014    Procedure: HOT HEMOSTASIS (ARGON PLASMA COAGULATION/BICAP);  Surgeon: Clarene Essex, MD;  Location: [name];  Service: Endoscopy;  Laterality: N/A;  . Esophagogastroduodenoscopy N/A 09/05/2014    Procedure: ESOPHAGOGASTRODUODENOSCOPY (EGD);  Surgeon: Laurence Spates, MD;  Location: [name];  Service: Endoscopy;  Laterality: N/A;  APC on standby to control bleeding  . Esophagogastroduodenoscopy endoscopy  08/19/2006    with laser treatment  . Esophagogastroduodenoscopy N/A 11/29/2014    Procedure: ESOPHAGOGASTRODUODENOSCOPY (EGD);  Surgeon: [name], MD;  Location: [facility] ENDOSCOPY;  Service: Endoscopy;  Laterality: N/A;    Current Outpatient Prescriptions  Medication Sig Dispense Refill  . AMITIZA 24 MCG capsule Take 24 mcg by mouth 2 (two) times daily.    . Blood Glucose Monitoring Suppl (ONETOUCH VERIO) W/DEVICE KIT 1 each by Does not apply route 3 (three) times daily. 1 kit 1  . furosemide (LASIX) 20 MG tablet Take 1 tablet (20 mg total) by mouth daily. 30 tablet 11  . glucose blood (ONETOUCH VERIO) test strip Check blood sugar three times a day 100 each 5  . insulin detemir (LEVEMIR) 100 UNIT/ML injection Inject 0.3 mLs (30 Units total) into the skin at bedtime. (Patient taking differently: Inject 30 Units into the skin 2 (two) times daily. ) 10 mL 11  . levETIRAcetam (KEPPRA) 500 MG tablet Take 1 tablet (500 mg total) by mouth 2 (two) times daily. 60 tablet 11  . metoprolol succinate (TOPROL XL) 25 MG 24 hr tablet Take 0.5 tablets (12.5 mg total) by mouth daily.  30 tablet 11  . ONETOUCH DELICA LANCETS FINE MISC Check blood sugar three times a day 100 each 5  . pantoprazole (PROTONIX) 40 MG tablet Take 1 tablet (40 mg total) by mouth 2 (two) times daily. (Patient taking differently: Take 40 mg by mouth daily. ) 60 tablet 1  . potassium chloride SA (K-DUR,KLOR-CON) 20 MEQ tablet Take 1 tablet (20 mEq total) by mouth daily. 30 tablet 11  . ferrous fumarate (HEMOCYTE - 106 MG FE) 325 (106 FE) MG TABS tablet Take 1 tablet by mouth daily. Reported on 01/30/2015    . oxymetazoline (AFRIN) 0.05 % nasal spray Place 1 spray into both nostrils 2 (two) times daily as needed (Epistaxis). (Patient not taking: Reported on 01/30/2015) 30 mL 0  . traMADol (ULTRAM) 50 MG tablet Take 50 mg by mouth as needed.     No current facility-administered medications for this visit.    Allergies as of 01/30/2015 - Review Complete 01/30/2015  Allergen Reaction Noted  . Aspirin Nausea And Vomiting 05/12/2006    Vitals: BP 150/72 mmHg  Pulse 69  Ht _0  (1.6 m)  Wt 141 lb (63.957 kg)  BMI 24.98 kg/m2  SpO2 98% Last Weight:  Wt Readings from Last 1 Encounters:  01/30/15 141 lb (63.957 kg)   Last Height:   Ht Readings from Last 1 Encounters:  01/30/15 _1  (1.6 m)    Speech:  Speech is normal; fluent and spontaneous with normal comprehension.  Cognition:   Montreal Cognitive Assessment  01/30/2015  Visuospatial/ Executive (0/5) 1  Naming (0/3) 1  Attention: Read list of digits (0/2) 2  Attention: Read list of letters (0/1) 0  Attention: Serial 7 subtraction starting at 100 (0/3) 0  Language: Repeat phrase (0/2) 2  Language : Fluency (0/1) 0  Abstraction (0/2) 2  Delayed Recall (0/5) 1  Orientation (0/6) 5  Total 14  Adjusted Score (based on education) 15    Cranial Nerves:  The pupils are equal, round, and reactive to light. Visual fields are full to finger confrontation. Extraocular movements are intact. Trigeminal sensation is intact and the  muscles of mastication are normal. The face is symmetric(bilat ptosis chronic). The palate elevates in the midline. Hearing intact. Voice is normal. Shoulder shrug is normal. The tongue has normal motion without fasciculations.   Coordination:  Normal finger to nose and heel to shin. Normal rapid alternating movements.   Gait:  Heel-toe and tandem gait are normal.   Motor Observation:  No asymmetry, no atrophy, and no involuntary movements noted. Tone:  Normal muscle tone.   Posture:  Posture is normal. normal erect   Strength: Mild bilat hip flexion weakness otherwise strength is V/V in the upper and lower limbs.    Sensation: intact to LT   Reflex Exam:  DTR's: absent AJs  Toes:  The toes are downgoing bilaterally.  Clonus:  Clonus is absent.     Assessment/Plan: 74 y.o. female here as a referral from Dr. Jeanie Cooks for seizure in the setting of hyperammonemia. She has a past medical history of hypertension, congestive heart failure, diabetes, acute encephalopathy with status epilepticus, acute renal failure superimposed on stage III chronic kidney disease, urinary tract infections. 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 She was started on Keppra. It was thought that the hyperammonemia was likely the driving factor for for this seizure which could be considered provoked however husband says he has witnessed other seizures in the past so at this point I do feel she should stay on the [place]. She is tolerating Keppra well. We'll order Keppra level today and see patient back often. Advised to call me should patient have any repeat symptoms or proceed directly to the emergency room.  Also c/o memory loss. 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 Will start Aricept. Start Namenda at next appointment.  Sarina Ill, MD  [facility] [address] [place], [place] [address]  Phone [phone] Fax  [phone]  A total of 45 minutes was spent face-to-face with this patient. Over half this time was spent on counseling patient on the seizure and memory loss diagnosis and different diagnostic and therapeutic options available.

## 2015-01-30 NOTE — Patient Instructions (Signed)
Overall you are doing fairly well but I do want to suggest a few things today:   Remember to drink plenty of fluid, eat healthy meals and do not skip any meals. Try to eat protein with a every meal and eat a healthy snack such as fruit or nuts in between meals. Try to keep a regular sleep-wake schedule and try to exercise daily, particularly in the form of walking, 20-30 minutes a day, if you can.   As far as your medications are concerned, I would like to suggest: Start Aricept at 1/2 pill for 2 weeks and as long as there are no side effects can increase to a whole pill daily.   As far as diagnostic testing: labs  I would like to see you back in 6 months, sooner if we need to. Please call us with any interim questions, concerns, problems, updates or refill requests.   Please also call us for any test results so we can go over those with you on the phone.  My clinical assistant and will answer any of your questions and relay your messages to me and also relay most of my messages to you.   Our phone number is [phone]. We also have an after hours call service for urgent matters and there is a physician on-call for urgent questions. For any emergencies you know to call 911 or go to the nearest emergency room

## 2015-01-31 DIAGNOSIS — M545 Low back pain: Secondary | ICD-10-CM | POA: Diagnosis not present

## 2015-01-31 DIAGNOSIS — E1140 Type 2 diabetes mellitus with diabetic neuropathy, unspecified: Secondary | ICD-10-CM | POA: Diagnosis not present

## 2015-01-31 DIAGNOSIS — I5032 Chronic diastolic (congestive) heart failure: Secondary | ICD-10-CM | POA: Diagnosis not present

## 2015-01-31 LAB — THYROID PANEL WITH TSH
Free Thyroxine Index: 2.5 (ref 1.2–4.9)
T3 Uptake Ratio: 35 % (ref 24–39)
T4, Total: 7 ug/dL (ref 4.5–12.0)
TSH: 1.73 u[IU]/mL (ref 0.450–4.500)

## 2015-01-31 LAB — AMMONIA: Ammonia: 180 ug/dL (ref 19–87)

## 2015-02-01 DIAGNOSIS — M545 Low back pain: Secondary | ICD-10-CM | POA: Diagnosis not present

## 2015-02-01 DIAGNOSIS — E1140 Type 2 diabetes mellitus with diabetic neuropathy, unspecified: Secondary | ICD-10-CM | POA: Diagnosis not present

## 2015-02-01 DIAGNOSIS — I5032 Chronic diastolic (congestive) heart failure: Secondary | ICD-10-CM | POA: Diagnosis not present

## 2015-02-02 DIAGNOSIS — I5032 Chronic diastolic (congestive) heart failure: Secondary | ICD-10-CM | POA: Diagnosis not present

## 2015-02-02 DIAGNOSIS — M545 Low back pain: Secondary | ICD-10-CM | POA: Diagnosis not present

## 2015-02-02 DIAGNOSIS — E1140 Type 2 diabetes mellitus with diabetic neuropathy, unspecified: Secondary | ICD-10-CM | POA: Diagnosis not present

## 2015-02-03 DIAGNOSIS — E1140 Type 2 diabetes mellitus with diabetic neuropathy, unspecified: Secondary | ICD-10-CM | POA: Diagnosis not present

## 2015-02-03 DIAGNOSIS — M545 Low back pain: Secondary | ICD-10-CM | POA: Diagnosis not present

## 2015-02-03 DIAGNOSIS — I5032 Chronic diastolic (congestive) heart failure: Secondary | ICD-10-CM | POA: Diagnosis not present

## 2015-02-04 DIAGNOSIS — I5032 Chronic diastolic (congestive) heart failure: Secondary | ICD-10-CM | POA: Diagnosis not present

## 2015-02-04 DIAGNOSIS — M545 Low back pain: Secondary | ICD-10-CM | POA: Diagnosis not present

## 2015-02-04 DIAGNOSIS — E1140 Type 2 diabetes mellitus with diabetic neuropathy, unspecified: Secondary | ICD-10-CM | POA: Diagnosis not present

## 2015-02-05 ENCOUNTER — Telehealth: Payer: Self-pay | Admitting: *Deleted

## 2015-02-05 DIAGNOSIS — E1140 Type 2 diabetes mellitus with diabetic neuropathy, unspecified: Secondary | ICD-10-CM | POA: Diagnosis not present

## 2015-02-05 DIAGNOSIS — M545 Low back pain: Secondary | ICD-10-CM | POA: Diagnosis not present

## 2015-02-05 DIAGNOSIS — I5032 Chronic diastolic (congestive) heart failure: Secondary | ICD-10-CM | POA: Diagnosis not present

## 2015-02-05 NOTE — Telephone Encounter (Signed)
I called and LMVM for pt to return call for lab results.  Tried both # LMVM on [phone] to return call (per DPR).

## 2015-02-05 NOTE — Telephone Encounter (Signed)
-----   Message from Melvenia Beam, MD sent at 02/03/2015 11:32 AM EST ----- Patient's ammonia level is very elevated again. She should follow up with the physician who is managing her ammonia level. thanks

## 2015-02-06 ENCOUNTER — Ambulatory Visit (HOSPITAL_BASED_OUTPATIENT_CLINIC_OR_DEPARTMENT_OTHER): Payer: Medicare Other | Admitting: Oncology

## 2015-02-06 ENCOUNTER — Other Ambulatory Visit: Payer: Self-pay | Admitting: Oncology

## 2015-02-06 ENCOUNTER — Encounter: Payer: Self-pay | Admitting: *Deleted

## 2015-02-06 ENCOUNTER — Ambulatory Visit (HOSPITAL_BASED_OUTPATIENT_CLINIC_OR_DEPARTMENT_OTHER): Payer: Medicare Other

## 2015-02-06 VITALS — BP 116/44 | HR 61 | Temp 97.0°F | Resp 18

## 2015-02-06 DIAGNOSIS — I780 Hereditary hemorrhagic telangiectasia: Secondary | ICD-10-CM

## 2015-02-06 DIAGNOSIS — M545 Low back pain: Secondary | ICD-10-CM | POA: Diagnosis not present

## 2015-02-06 DIAGNOSIS — D649 Anemia, unspecified: Secondary | ICD-10-CM

## 2015-02-06 DIAGNOSIS — K922 Gastrointestinal hemorrhage, unspecified: Secondary | ICD-10-CM | POA: Diagnosis not present

## 2015-02-06 DIAGNOSIS — D500 Iron deficiency anemia secondary to blood loss (chronic): Secondary | ICD-10-CM

## 2015-02-06 DIAGNOSIS — I10 Essential (primary) hypertension: Secondary | ICD-10-CM | POA: Diagnosis not present

## 2015-02-06 DIAGNOSIS — I5032 Chronic diastolic (congestive) heart failure: Secondary | ICD-10-CM | POA: Diagnosis not present

## 2015-02-06 DIAGNOSIS — K31811 Angiodysplasia of stomach and duodenum with bleeding: Secondary | ICD-10-CM

## 2015-02-06 DIAGNOSIS — Q2733 Arteriovenous malformation of digestive system vessel: Secondary | ICD-10-CM | POA: Diagnosis not present

## 2015-02-06 DIAGNOSIS — K31819 Angiodysplasia of stomach and duodenum without bleeding: Secondary | ICD-10-CM

## 2015-02-06 DIAGNOSIS — E1140 Type 2 diabetes mellitus with diabetic neuropathy, unspecified: Secondary | ICD-10-CM | POA: Diagnosis not present

## 2015-02-06 LAB — CBC WITH DIFFERENTIAL/PLATELET
BASO%: 0.8 % (ref 0.0–2.0)
Basophils Absolute: 0 10*3/uL (ref 0.0–0.1)
EOS ABS: 0.3 10*3/uL (ref 0.0–0.5)
EOS%: 11.1 % — ABNORMAL HIGH (ref 0.0–7.0)
HCT: 24.5 % — ABNORMAL LOW (ref 34.8–46.6)
HEMOGLOBIN: 7.4 g/dL — AB (ref 11.6–15.9)
LYMPH%: 16.7 % (ref 14.0–49.7)
MCH: 23.3 pg — ABNORMAL LOW (ref 25.1–34.0)
MCHC: 30.1 g/dL — ABNORMAL LOW (ref 31.5–36.0)
MCV: 77.4 fL — AB (ref 79.5–101.0)
MONO#: 0.4 10*3/uL (ref 0.1–0.9)
MONO%: 11.7 % (ref 0.0–14.0)
NEUT%: 59.7 % (ref 38.4–76.8)
NEUTROS ABS: 1.9 10*3/uL (ref 1.5–6.5)
Platelets: 194 10*3/uL (ref 145–400)
RBC: 3.16 10*6/uL — AB (ref 3.70–5.45)
RDW: 18.1 % — AB (ref 11.2–14.5)
WBC: 3.1 10*3/uL — AB (ref 3.9–10.3)
lymph#: 0.5 10*3/uL — ABNORMAL LOW (ref 0.9–3.3)

## 2015-02-06 LAB — FERRITIN: FERRITIN: 4 ng/mL — AB (ref 9–269)

## 2015-02-06 LAB — PREPARE RBC (CROSSMATCH)

## 2015-02-06 LAB — HOLD TUBE, BLOOD BANK

## 2015-02-06 MED ORDER — FUROSEMIDE 10 MG/ML IJ SOLN
20.0000 mg | Freq: Once | INTRAMUSCULAR | Status: AC
  Administered 2015-02-06: 10 mg via INTRAVENOUS

## 2015-02-06 MED ORDER — FUROSEMIDE 10 MG/ML IJ SOLN
INTRAMUSCULAR | Status: AC
  Filled 2015-02-06: qty 2

## 2015-02-06 MED ORDER — DIPHENHYDRAMINE HCL 25 MG PO CAPS
25.0000 mg | ORAL_CAPSULE | Freq: Once | ORAL | Status: AC
  Administered 2015-02-06: 25 mg via ORAL

## 2015-02-06 MED ORDER — SODIUM CHLORIDE 0.9 % IJ SOLN
10.0000 mL | INTRAMUSCULAR | Status: AC | PRN
  Administered 2015-02-06: 10 mL
  Filled 2015-02-06: qty 10

## 2015-02-06 MED ORDER — ACETAMINOPHEN 325 MG PO TABS
650.0000 mg | ORAL_TABLET | Freq: Once | ORAL | Status: AC
  Administered 2015-02-06: 650 mg via ORAL

## 2015-02-06 MED ORDER — DIPHENHYDRAMINE HCL 25 MG PO CAPS
ORAL_CAPSULE | ORAL | Status: AC
  Filled 2015-02-06: qty 1

## 2015-02-06 MED ORDER — HEPARIN SOD (PORK) LOCK FLUSH 100 UNIT/ML IV SOLN
500.0000 [IU] | Freq: Every day | INTRAVENOUS | Status: AC | PRN
  Administered 2015-02-06: 500 [IU]
  Filled 2015-02-06: qty 5

## 2015-02-06 MED ORDER — SODIUM CHLORIDE 0.9 % IV SOLN: 250.0000 mL | Freq: Once | INTRAVENOUS | Status: DC

## 2015-02-06 MED ORDER — ACETAMINOPHEN 325 MG PO TABS
ORAL_TABLET | ORAL | Status: AC
  Filled 2015-02-06: qty 2

## 2015-02-06 MED ORDER — SODIUM CHLORIDE 0.9 % IV SOLN
250.0000 mL | Freq: Once | INTRAVENOUS | Status: AC
  Administered 2015-02-06: 250 mL via INTRAVENOUS

## 2015-02-06 NOTE — Progress Notes (Signed)
Reviewed future appointments with pt. Pt aware and verbalizes understanding.

## 2015-02-06 NOTE — Telephone Encounter (Signed)
I called and spoke to pt, she wanted me to speak to her husband.  I call home # and relayed the results for her labs (elevated ammonia level).  I would forward to Dr. Juleen China at [facility].  He verbalized understanding.

## 2015-02-06 NOTE — Progress Notes (Deleted)
BP 116/44 Selena Lesser NP aware and pt to receive 10mg  IV lasix instead of 20mg  IV lasix.

## 2015-02-06 NOTE — Patient Instructions (Signed)

## 2015-02-06 NOTE — Progress Notes (Signed)
Selena Lesser, NP notified of patient's BP, verbal order received to give 10 mg of Lasix IVP instead of 20 mg as previously ordered.

## 2015-02-07 DIAGNOSIS — E1140 Type 2 diabetes mellitus with diabetic neuropathy, unspecified: Secondary | ICD-10-CM | POA: Diagnosis not present

## 2015-02-07 DIAGNOSIS — I5032 Chronic diastolic (congestive) heart failure: Secondary | ICD-10-CM | POA: Diagnosis not present

## 2015-02-07 DIAGNOSIS — M545 Low back pain: Secondary | ICD-10-CM | POA: Diagnosis not present

## 2015-02-07 LAB — TYPE AND SCREEN
ABO/RH(D): A POS
ANTIBODY SCREEN: NEGATIVE
Unit division: 0
Unit division: 0

## 2015-02-08 DIAGNOSIS — I5032 Chronic diastolic (congestive) heart failure: Secondary | ICD-10-CM | POA: Diagnosis not present

## 2015-02-08 DIAGNOSIS — E1140 Type 2 diabetes mellitus with diabetic neuropathy, unspecified: Secondary | ICD-10-CM | POA: Diagnosis not present

## 2015-02-08 DIAGNOSIS — M545 Low back pain: Secondary | ICD-10-CM | POA: Diagnosis not present

## 2015-02-09 DIAGNOSIS — E1140 Type 2 diabetes mellitus with diabetic neuropathy, unspecified: Secondary | ICD-10-CM | POA: Diagnosis not present

## 2015-02-09 DIAGNOSIS — M545 Low back pain: Secondary | ICD-10-CM | POA: Diagnosis not present

## 2015-02-09 DIAGNOSIS — I5032 Chronic diastolic (congestive) heart failure: Secondary | ICD-10-CM | POA: Diagnosis not present

## 2015-02-15 ENCOUNTER — Telehealth: Payer: Self-pay | Admitting: Internal Medicine

## 2015-02-15 ENCOUNTER — Ambulatory Visit (HOSPITAL_BASED_OUTPATIENT_CLINIC_OR_DEPARTMENT_OTHER): Payer: Medicare Other

## 2015-02-15 VITALS — BP 107/45 | HR 68 | Temp 98.4°F | Resp 18

## 2015-02-15 DIAGNOSIS — D509 Iron deficiency anemia, unspecified: Secondary | ICD-10-CM | POA: Diagnosis not present

## 2015-02-15 DIAGNOSIS — I780 Hereditary hemorrhagic telangiectasia: Secondary | ICD-10-CM | POA: Diagnosis not present

## 2015-02-15 DIAGNOSIS — K31811 Angiodysplasia of stomach and duodenum with bleeding: Secondary | ICD-10-CM

## 2015-02-15 DIAGNOSIS — K31819 Angiodysplasia of stomach and duodenum without bleeding: Secondary | ICD-10-CM

## 2015-02-15 MED ORDER — HEPARIN SOD (PORK) LOCK FLUSH 100 UNIT/ML IV SOLN
500.0000 [IU] | Freq: Once | INTRAVENOUS | Status: AC
  Administered 2015-02-15: 500 [IU] via INTRAVENOUS
  Filled 2015-02-15: qty 5

## 2015-02-15 MED ORDER — SODIUM CHLORIDE 0.9 % IJ SOLN
10.0000 mL | INTRAMUSCULAR | Status: DC | PRN
  Administered 2015-02-15: 10 mL via INTRAVENOUS
  Filled 2015-02-15: qty 10

## 2015-02-15 MED ORDER — FERUMOXYTOL INJECTION 510 MG/17 ML
510.0000 mg | Freq: Once | INTRAVENOUS | Status: AC
  Administered 2015-02-15: 510 mg via INTRAVENOUS
  Filled 2015-02-15: qty 17

## 2015-02-15 NOTE — Patient Instructions (Signed)

## 2015-02-15 NOTE — Telephone Encounter (Signed)
RN [facility] CALLED PT IS HAVING LEGS CRAMPS

## 2015-02-22 ENCOUNTER — Telehealth: Payer: Self-pay | Admitting: *Deleted

## 2015-02-22 ENCOUNTER — Ambulatory Visit: Payer: Medicare Other

## 2015-02-22 NOTE — Telephone Encounter (Signed)
Called pt at home to remind her of missed appt today.  Spoke with pt.'s husband and he states that "she will not be able to make her appt today and can we reschedule?"  Note sent to scheduler to reschedule feraheme infusion until next week.

## 2015-02-25 ENCOUNTER — Telehealth: Payer: Self-pay | Admitting: Oncology

## 2015-02-25 NOTE — Telephone Encounter (Signed)
Patient re lab q2w. Gave patient appointments until see is seen by JG 2/6. Patient has lab appointments for 1/18, 2/1 and 2/15.

## 2015-02-26 ENCOUNTER — Telehealth: Payer: Self-pay | Admitting: Oncology

## 2015-02-26 NOTE — Telephone Encounter (Signed)
Per 1/13 pof r/s 1/13 inf. Scheduled inf for 1/19 and moved 1/18 lab to 1/19 to coord with inf. Spoke with patient spouse re changes and new date/time for 1/19 @ 1pm.

## 2015-02-27 ENCOUNTER — Other Ambulatory Visit: Payer: Medicare Other

## 2015-02-28 ENCOUNTER — Other Ambulatory Visit: Payer: Self-pay | Admitting: Oncology

## 2015-02-28 ENCOUNTER — Other Ambulatory Visit (HOSPITAL_BASED_OUTPATIENT_CLINIC_OR_DEPARTMENT_OTHER): Payer: Medicare Other

## 2015-02-28 ENCOUNTER — Telehealth: Payer: Self-pay | Admitting: Oncology

## 2015-02-28 ENCOUNTER — Ambulatory Visit (HOSPITAL_COMMUNITY)
Admission: RE | Admit: 2015-02-28 | Discharge: 2015-02-28 | Disposition: A | Discharge status: Home / Self Care | Payer: Medicare Other | Source: Ambulatory Visit | Attending: Oncology | Admitting: Oncology

## 2015-02-28 ENCOUNTER — Ambulatory Visit (HOSPITAL_BASED_OUTPATIENT_CLINIC_OR_DEPARTMENT_OTHER): Payer: Medicare Other

## 2015-02-28 VITALS — BP 115/39 | HR 56 | Temp 97.1°F | Resp 18

## 2015-02-28 DIAGNOSIS — I780 Hereditary hemorrhagic telangiectasia: Secondary | ICD-10-CM | POA: Diagnosis not present

## 2015-02-28 DIAGNOSIS — Q2733 Arteriovenous malformation of digestive system vessel: Secondary | ICD-10-CM

## 2015-02-28 DIAGNOSIS — D500 Iron deficiency anemia secondary to blood loss (chronic): Secondary | ICD-10-CM

## 2015-02-28 DIAGNOSIS — D509 Iron deficiency anemia, unspecified: Secondary | ICD-10-CM | POA: Diagnosis not present

## 2015-02-28 DIAGNOSIS — K31811 Angiodysplasia of stomach and duodenum with bleeding: Secondary | ICD-10-CM

## 2015-02-28 DIAGNOSIS — K31819 Angiodysplasia of stomach and duodenum without bleeding: Secondary | ICD-10-CM

## 2015-02-28 LAB — CBC WITH DIFFERENTIAL/PLATELET
BASO%: 0.3 % (ref 0.0–2.0)
Basophils Absolute: 0 10*3/uL (ref 0.0–0.1)
EOS%: 4.7 % (ref 0.0–7.0)
Eosinophils Absolute: 0.3 10*3/uL (ref 0.0–0.5)
HCT: 25.3 % — ABNORMAL LOW (ref 34.8–46.6)
HGB: 7.9 g/dL — ABNORMAL LOW (ref 11.6–15.9)
LYMPH%: 13.4 % — ABNORMAL LOW (ref 14.0–49.7)
MCH: 27.2 pg (ref 25.1–34.0)
MCHC: 31.2 g/dL — ABNORMAL LOW (ref 31.5–36.0)
MCV: 87.2 fL (ref 79.5–101.0)
MONO#: 0.6 10*3/uL (ref 0.1–0.9)
MONO%: 9 % (ref 0.0–14.0)
NEUT#: 4.8 10*3/uL (ref 1.5–6.5)
NEUT%: 72.6 % (ref 38.4–76.8)
Platelets: 295 10*3/uL (ref 145–400)
RBC: 2.9 10*6/uL — ABNORMAL LOW (ref 3.70–5.45)
RDW: 20.4 % — ABNORMAL HIGH (ref 11.2–14.5)
WBC: 6.7 10*3/uL (ref 3.9–10.3)
lymph#: 0.9 10*3/uL (ref 0.9–3.3)

## 2015-02-28 LAB — FERRITIN: Ferritin: 115 ng/ml (ref 9–269)

## 2015-02-28 MED ORDER — SODIUM CHLORIDE 0.9 % IJ SOLN
10.0000 mL | Freq: Once | INTRAMUSCULAR | Status: AC
  Administered 2015-02-28: 10 mL via INTRAVENOUS
  Filled 2015-02-28: qty 10

## 2015-02-28 MED ORDER — FERUMOXYTOL INJECTION 510 MG/17 ML
510.0000 mg | Freq: Once | INTRAVENOUS | Status: AC
  Administered 2015-02-28: 510 mg via INTRAVENOUS
  Filled 2015-02-28: qty 17

## 2015-02-28 MED ORDER — SODIUM CHLORIDE 0.9 % IV SOLN
INTRAVENOUS | Status: DC
  Administered 2015-02-28: 14:00:00 via INTRAVENOUS

## 2015-02-28 MED ORDER — HEPARIN SOD (PORK) LOCK FLUSH 100 UNIT/ML IV SOLN
500.0000 [IU] | Freq: Once | INTRAVENOUS | Status: AC
  Administered 2015-02-28: 500 [IU] via INTRAVENOUS
  Filled 2015-02-28: qty 5

## 2015-02-28 NOTE — Patient Instructions (Signed)

## 2015-02-28 NOTE — Telephone Encounter (Signed)
Added blood for patient tomorrow at 830 am per inf mgr - gave patient appointment in inf area.

## 2015-03-01 ENCOUNTER — Ambulatory Visit (HOSPITAL_BASED_OUTPATIENT_CLINIC_OR_DEPARTMENT_OTHER): Payer: Medicare Other

## 2015-03-01 VITALS — BP 121/55 | HR 62 | Temp 97.7°F | Resp 16

## 2015-03-01 DIAGNOSIS — K31811 Angiodysplasia of stomach and duodenum with bleeding: Secondary | ICD-10-CM | POA: Diagnosis not present

## 2015-03-01 DIAGNOSIS — I780 Hereditary hemorrhagic telangiectasia: Secondary | ICD-10-CM

## 2015-03-01 LAB — PREPARE RBC (CROSSMATCH)

## 2015-03-01 MED ORDER — FUROSEMIDE 10 MG/ML IJ SOLN: 20.0000 mg | Freq: Once | INTRAMUSCULAR | Status: DC

## 2015-03-01 MED ORDER — SODIUM CHLORIDE 0.9 % IV SOLN
250.0000 mL | Freq: Once | INTRAVENOUS | Status: AC
  Administered 2015-03-01: 250 mL via INTRAVENOUS

## 2015-03-01 MED ORDER — HEPARIN SOD (PORK) LOCK FLUSH 100 UNIT/ML IV SOLN
500.0000 [IU] | Freq: Every day | INTRAVENOUS | Status: AC | PRN
  Administered 2015-03-01: 500 [IU]
  Filled 2015-03-01: qty 5

## 2015-03-01 MED ORDER — SODIUM CHLORIDE 0.9 % IJ SOLN
10.0000 mL | INTRAMUSCULAR | Status: AC | PRN
  Administered 2015-03-01: 10 mL
  Filled 2015-03-01: qty 10

## 2015-03-01 NOTE — Patient Instructions (Signed)
Blood Transfusion   A blood transfusion is a procedure that gives you donated blood through an IV tube. You may need blood because of illness, surgery, or injury. The blood may come from a donor. The blood may also be your own blood that you donated earlier.  The blood you get is made up of different types of cells. You may get:    Red blood cells. These carry oxygen and replace lost blood.    Platelets. These control bleeding.    Plasma. This helps blood to clot.  If you have a clotting disorder, you may also get other types of blood products.   BEFORE THE PROCEDURE   You may have a blood test. This finds out what type of blood you have. It also finds out what kind of blood your body will accept.    If you are going to have a planned surgery, you may donate your own blood. This is done in case you need to have a transfusion.    If you have had an allergic transfusion reaction before, you may be given medicine to help prevent a reaction. Take this medicine only as told by your doctor.   You will have your temperature, blood pressure, and pulse checked.  PROCEDURE    An IV will be started in your hand or arm.    The bag of donated blood will be attached to your IV and run into your vein.    A doctor will regularly check your temperature, blood pressure, and pulse during the procedure. This is done to find any early signs of a transfusion reaction.   If you have any signs or symptoms of a reaction, the procedure may be stopped and you may be given medicine.    When the transfusion is over, your IV will be removed.    Pressure may be applied to the IV site for a few minutes.    A bandage (dressing) will be applied.   The procedure may vary among doctors and hospitals.   AFTER THE PROCEDURE   Your blood pressure, temperature, and pulse will be checked regularly.     This information is not intended to replace advice given to you by your health care provider. Make sure you discuss any questions  you have with your health care provider.     Document Released: 04/24/2008 Document Revised: 02/16/2014 Document Reviewed: 12/06/2013  Elsevier Interactive Patient Education 2016 [employer]

## 2015-03-02 LAB — TYPE AND SCREEN
ABO/RH(D): A POS
ANTIBODY SCREEN: NEGATIVE
Unit division: 0
Unit division: 0

## 2015-03-13 ENCOUNTER — Other Ambulatory Visit (HOSPITAL_BASED_OUTPATIENT_CLINIC_OR_DEPARTMENT_OTHER): Payer: Medicare Other

## 2015-03-13 DIAGNOSIS — I780 Hereditary hemorrhagic telangiectasia: Secondary | ICD-10-CM

## 2015-03-13 DIAGNOSIS — D509 Iron deficiency anemia, unspecified: Secondary | ICD-10-CM | POA: Diagnosis not present

## 2015-03-13 DIAGNOSIS — K31819 Angiodysplasia of stomach and duodenum without bleeding: Secondary | ICD-10-CM

## 2015-03-13 DIAGNOSIS — D500 Iron deficiency anemia secondary to blood loss (chronic): Secondary | ICD-10-CM

## 2015-03-13 LAB — CBC WITH DIFFERENTIAL/PLATELET
BASO%: 0.5 % (ref 0.0–2.0)
BASOS ABS: 0 10*3/uL (ref 0.0–0.1)
EOS ABS: 0.3 10*3/uL (ref 0.0–0.5)
EOS%: 6 % (ref 0.0–7.0)
HEMATOCRIT: 29.5 % — AB (ref 34.8–46.6)
HEMOGLOBIN: 9.7 g/dL — AB (ref 11.6–15.9)
LYMPH#: 0.4 10*3/uL — AB (ref 0.9–3.3)
LYMPH%: 8.3 % — ABNORMAL LOW (ref 14.0–49.7)
MCH: 28.1 pg (ref 25.1–34.0)
MCHC: 32.9 g/dL (ref 31.5–36.0)
MCV: 85.5 fL (ref 79.5–101.0)
MONO#: 0.3 10*3/uL (ref 0.1–0.9)
MONO%: 6.4 % (ref 0.0–14.0)
NEUT%: 78.8 % — ABNORMAL HIGH (ref 38.4–76.8)
NEUTROS ABS: 4.1 10*3/uL (ref 1.5–6.5)
Platelets: 140 10*3/uL — ABNORMAL LOW (ref 145–400)
RBC: 3.45 10*6/uL — ABNORMAL LOW (ref 3.70–5.45)
RDW: 21.7 % — ABNORMAL HIGH (ref 11.2–14.5)
WBC: 5.2 10*3/uL (ref 3.9–10.3)

## 2015-03-18 ENCOUNTER — Ambulatory Visit (INDEPENDENT_AMBULATORY_CARE_PROVIDER_SITE_OTHER): Payer: Medicare Other | Admitting: Oncology

## 2015-03-18 ENCOUNTER — Encounter: Payer: Self-pay | Admitting: Oncology

## 2015-03-18 VITALS — BP 153/51 | HR 80 | Temp 98.2°F | Ht 61.0 in | Wt 140.2 lb

## 2015-03-18 DIAGNOSIS — I110 Hypertensive heart disease with heart failure: Secondary | ICD-10-CM

## 2015-03-18 DIAGNOSIS — R413 Other amnesia: Secondary | ICD-10-CM

## 2015-03-18 DIAGNOSIS — D500 Iron deficiency anemia secondary to blood loss (chronic): Secondary | ICD-10-CM

## 2015-03-18 DIAGNOSIS — I780 Hereditary hemorrhagic telangiectasia: Secondary | ICD-10-CM

## 2015-03-18 DIAGNOSIS — Q2733 Arteriovenous malformation of digestive system vessel: Secondary | ICD-10-CM | POA: Diagnosis not present

## 2015-03-18 DIAGNOSIS — K31819 Angiodysplasia of stomach and duodenum without bleeding: Secondary | ICD-10-CM

## 2015-03-18 DIAGNOSIS — E119 Type 2 diabetes mellitus without complications: Secondary | ICD-10-CM

## 2015-03-18 DIAGNOSIS — I5032 Chronic diastolic (congestive) heart failure: Secondary | ICD-10-CM

## 2015-03-18 DIAGNOSIS — D509 Iron deficiency anemia, unspecified: Secondary | ICD-10-CM

## 2015-03-18 DIAGNOSIS — Z794 Long term (current) use of insulin: Secondary | ICD-10-CM

## 2015-03-18 DIAGNOSIS — K31811 Angiodysplasia of stomach and duodenum with bleeding: Secondary | ICD-10-CM

## 2015-03-18 NOTE — Patient Instructions (Addendum)
Starting on Wednesday February 15, come to the [place] lab for all of your blood counts. We will move your blood transfusions to [facility] Short Stay unit on 2nd floor. Check in with admitting desk on days you get blood or iron Continue to check blood counts every 2 weeks Visit with Dr Darnell Level in 4-5 months  Another doctor started you on donepazil (Aricept) to help with your memory and Amitizia for your constipation

## 2015-03-18 NOTE — Progress Notes (Signed)
Patient ID: [name], female   DOB: 1940/12/31, 75 y.o.   MRN: [id number] Hematology and Oncology Follow Up Visit  [name] [id number] 1940/02/26 75 y.o. 03/18/2015 4:33 PM   Principle Diagnosis: Encounter Diagnoses  Name Primary?  . 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 Iron deficiency anemia   . Gastric AVM   Clinical Summary: 75 year old lady with hereditary hemorrhagic telangiectasias with primary manifestation of her disease being chronic GI blood loss from gastric and, likely intestinal, angiodysplasia. She also has intermittent epistaxis. She requires frequent transfusions and parenteral iron supplementation. She does not tolerate oral iron. Transfusion requirements are becoming more frequent.  She required hospital admission in August 2015 for urgent blood transfusion. She was evaluated by gastroenterology and underwent upper endoscopy on August 26 by Dr. Laurence [name]. A number of her gastric AVMs were actively bleeding. Some were cauterized. The largest was clipped. Despite laser fulguration and clipping of gastric AVMs, she continues to lose blood on a continuous basis.   She continues to be transfusion dependent. I alternate parenteral iron with blood to try to keep her stable. It is an ongoing challenge.She had an episode of hematemesis and was admitted on Jul 05, 2014. A number of AVMs and previous scars from laser procedures were seen but no active bleeding at time of upper endoscopy on May 27. She had a another procedure on July 27. A large AVM along the greater curvature of the stomach was bleeding and required laser therapy.  Interim History:  She just had a birthday yesterday. She is now 75 years old. She had a brief admission to the hospital on 11/29/2014 for recurrent GI bleed presenting with recurrent hematemesis. Hemoglobin 7.3.. She underwent endoscopy. A small gastric AVM  along the greater curvature was laser coagulated. She was transfused to a stable hemoglobin. I continue to monitor blood counts every 2 weeks. Most recent transfusion given on January 20. Most recent dose of IV iron given January 19. She has had no gross bleeding since then. Hemoglobin was 9.7 on February 1. She has had 2 minor self-limited episodes of epistaxis. No hematochezia. No hematuria. She had a follow-up visit with her cardiologist in December. Her chronic diastolic heart failure was stable and no medication changes were made. Her chronic medical prongs are now being managed by internal medicine service, primary care Dr. Osa [name].  She was told she had seizures back in September 2016. She was started on Keppra. She had a complete neurologic evaluation including MRI of the brain. It is not clear whether or not her "seizures" were related to metabolic disturbances. 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 Neurologist felt that she should stay on the [place]. Time of a recent 01/30/15 follow-up visit, neurologist prescribed Aricept when patient complained of memory loss and her MoCA score was 15/30 consistent with advanced memory loss.  Along the way she is also been started on a medication called hematochezia twice daily for chronic constipation.   Medications: reviewed  Allergies:  Allergies  Allergen Reactions  . Aspirin Nausea And Vomiting    Review of Systems: See history of present illness Remaining ROS negative:   Physical Exam: Blood pressure 153/51, pulse 80, temperature 98.2 F (36.8 C), temperature source Oral, height 5\' 1"  (1.549 m), weight 140 lb 3.2 oz (63.594 kg), SpO2 100 %. Wt Readings from Last 3 Encounters:  03/18/15 140 lb 3.2 oz (  63.594 kg)  01/30/15 141 lb (63.957 kg)  01/18/15 143 lb 1.6 oz (64.91 kg)     General appearance: Well-nourished African-American woman HENNT: Pharynx no erythema, exudate, mass, or ulcer. She is missing  a number of teeth. No thyromegaly or thyroid nodules Lymph nodes: No cervical, supraclavicular, or axillary lymphadenopathy Breasts:  Lungs: Clear to auscultation, resonant to percussion throughout Heart: Regular rhythm, 3/6 aortic systolic murmur, no gallop, no rub, no click, no edema Abdomen: Soft, nontender, normal bowel sounds, no mass, no organomegaly Extremities: No edema, no calf tenderness Musculoskeletal: no joint deformities GU:  Vascular: Carotid pulses 2+, no bruits,  Neurologic: Alert, oriented, PERRLA, optic discs sharp and vessels normal, no hemorrhage or exudate, cranial nerves grossly normal, motor strength 5 over 5, reflexes 1+ symmetric, upper body coordination normal,  Skin: No rash or ecchymosis  Lab Results: CBC W/Diff    Component Value Date/Time   WBC 5.2 03/13/2015 1059   WBC 4.5 12/07/2014 1426   WBC 5.5 11/09/2014 1547   RBC 3.45* 03/13/2015 1059   RBC 3.21* 12/07/2014 1426   RBC 2.59* 11/09/2014 1547   RBC 3.19* 02/01/2011 0839   HGB 9.7* 03/13/2015 1059   HGB 8.8* 12/07/2014 1426   HCT 29.5* 03/13/2015 1059   HCT 28.1* 12/07/2014 1426   HCT 23.2* 11/09/2014 1547   PLT 140* 03/13/2015 1059   PLT 245 12/07/2014 1426   PLT 237 11/09/2014 1547   MCV 85.5 03/13/2015 1059   MCV 87.5 12/07/2014 1426   MCV 90 11/09/2014 1547   MCH 28.1 03/13/2015 1059   MCH 27.4 12/07/2014 1426   MCH 28.2 11/09/2014 1547   MCHC 32.9 03/13/2015 1059   MCHC 31.3 12/07/2014 1426   MCHC 31.5 11/09/2014 1547   RDW 21.7* 03/13/2015 1059   RDW 15.4 12/07/2014 1426   RDW 17.8* 11/09/2014 1547   LYMPHSABS 0.4* 03/13/2015 1059   LYMPHSABS 0.7 11/29/2014 0449   MONOABS 0.3 03/13/2015 1059   MONOABS 0.6 11/29/2014 0449   EOSABS 0.3 03/13/2015 1059   EOSABS 0.2 11/29/2014 0449   BASOSABS 0.0 03/13/2015 1059   BASOSABS 0.0 11/29/2014 0449     Chemistry      Component Value Date/Time   NA 140 01/28/2015 0811   NA 144 04/04/2014 1019   K 3.5 01/28/2015 0811   K 4.5  04/04/2014 1019   CL 107 01/28/2015 0811   CL 111* 12/28/2011 0825   CO2 27 01/28/2015 0811   CO2 26 04/04/2014 1019   BUN 16 01/28/2015 0811   BUN 16.9 04/04/2014 1019   CREATININE 0.92 01/28/2015 0811   CREATININE 0.80 11/30/2014 0537   CREATININE 0.9 04/04/2014 1019      Component Value Date/Time   CALCIUM 8.7 01/28/2015 0811   CALCIUM 9.3 04/04/2014 1019   ALKPHOS 104 11/29/2014 0449   ALKPHOS 140 04/04/2014 1019   AST 22 11/29/2014 0449   AST 30 04/04/2014 1019   ALT 15 11/29/2014 0449   ALT 25 04/04/2014 1019   BILITOT 0.4 11/29/2014 0449   BILITOT 0.43 04/04/2014 1019       Radiological Studies: No results found.  Impression:  #1. HHT  #2. Chronic GI blood loss secondary to #1 Continue to monitor on an every two-week basis. Due to ongoing bed availability problems at the [facility], I'm going to transfer her transfusion therapy to [facility] short stay. We discussed this today and she is okay with this. This is unfortunate since the nurses at the  cancer center have taken such excellent care of her over the years and have really provided her with a tremendous amount of support. #3. Extensive lichen planus rash lower extremities #4. Aortic stenosis #5. Grade 2 chronic diastolic heart failure with severe asymmetric septal hypertrophy most recent echocardiogram 07/06/2014 #6. Essential hypertension. #7. Type 2 diabetes now insulin-dependent using Levemir #8. Faint carotid bruit left side. #9. Question seizure disorder recently started on Keppra September 2016 #10. Memory loss. Recently started on Aricept by neurology December 2016  CC: Patient Care Team: [name], DO as PCP - General (Internal Medicine)   [name], MD 2/6/20174:33 PM

## 2015-03-19 ENCOUNTER — Encounter: Payer: Medicare Other | Admitting: Dietician

## 2015-03-21 ENCOUNTER — Ambulatory Visit (INDEPENDENT_AMBULATORY_CARE_PROVIDER_SITE_OTHER): Payer: Medicare Other | Admitting: Internal Medicine

## 2015-03-21 ENCOUNTER — Other Ambulatory Visit: Payer: Self-pay | Admitting: Oncology

## 2015-03-21 ENCOUNTER — Ambulatory Visit (INDEPENDENT_AMBULATORY_CARE_PROVIDER_SITE_OTHER): Payer: Medicare Other | Admitting: Dietician

## 2015-03-21 ENCOUNTER — Encounter: Payer: Self-pay | Admitting: Internal Medicine

## 2015-03-21 VITALS — BP 131/44 | HR 71 | Temp 97.7°F | Wt 139.6 lb

## 2015-03-21 DIAGNOSIS — Z713 Dietary counseling and surveillance: Secondary | ICD-10-CM

## 2015-03-21 DIAGNOSIS — D509 Iron deficiency anemia, unspecified: Secondary | ICD-10-CM

## 2015-03-21 DIAGNOSIS — E1165 Type 2 diabetes mellitus with hyperglycemia: Secondary | ICD-10-CM

## 2015-03-21 DIAGNOSIS — E7220 Disorder of urea cycle metabolism, unspecified: Secondary | ICD-10-CM | POA: Diagnosis not present

## 2015-03-21 DIAGNOSIS — I10 Essential (primary) hypertension: Secondary | ICD-10-CM

## 2015-03-21 DIAGNOSIS — I780 Hereditary hemorrhagic telangiectasia: Secondary | ICD-10-CM

## 2015-03-21 DIAGNOSIS — IMO0002 Reserved for concepts with insufficient information to code with codable children: Secondary | ICD-10-CM

## 2015-03-21 DIAGNOSIS — E1129 Type 2 diabetes mellitus with other diabetic kidney complication: Secondary | ICD-10-CM

## 2015-03-21 DIAGNOSIS — Z794 Long term (current) use of insulin: Secondary | ICD-10-CM

## 2015-03-21 DIAGNOSIS — K31819 Angiodysplasia of stomach and duodenum without bleeding: Secondary | ICD-10-CM

## 2015-03-21 DIAGNOSIS — K31811 Angiodysplasia of stomach and duodenum with bleeding: Secondary | ICD-10-CM

## 2015-03-21 LAB — AMMONIA: Ammonia: 133 umol/L — ABNORMAL HIGH (ref 9–35)

## 2015-03-21 LAB — GLUCOSE, CAPILLARY: Glucose-Capillary: 264 mg/dL — ABNORMAL HIGH (ref 65–99)

## 2015-03-21 LAB — POCT GLYCOSYLATED HEMOGLOBIN (HGB A1C): Hemoglobin A1C: 6.5

## 2015-03-21 NOTE — Progress Notes (Signed)
  Medical Nutrition Therapy:  Appt start time: [id number] end time:  [id number]. Visit # 1  Assessment:  Primary concerns today: patient has not concerns Ms. Latterell is here with her husband today. She says would like more help at home cooking but not sure her husband would like that. She was referred for better eating habits, weight fluctuations. She says her sister cooks and her niece cooks for them sometimes. She tried ensure in the past and stopped it because it made her blood sugar increase too much. She is open to suggestions to help them eat healthier. Her husband also has diabetes and is on insulin and say he has had low blood sugars at times.   Preferred Learning Style:No preference indicated  Learning Readiness: Ready.contemplating  ANTHROPOMETRICS: weight139.6#, height-62.5", BMI ~ 25- which is fine for a 75 year old woman WEIGHT HISTORY:UBW was 150-160# lost 10# between 2015 and 2016, then lost to a low of 135# 10/2014 but has fluctuated between 135 adn 140 since them SLEEP:drinks water all night per husband and he has not noticed any restless sleep in her Bowels/Chewing/swallowing: poor dentition, trouble chewing meats, bowel movements daily with amitiza MEDICATIONS: patient draws up and gives her own insulin two times a day, her husband says he keeps track of her taking it, but she falls asleep and misses it ~ 1x/week BLOOD SUGAR: noted on meter some in high 400s and 3 lows to 63, 51 and 52 in the past 1-2 weeks DIETARY INTAKE: Usual eating pattern includes only breakfast is a sure meal, others are when she gets huingry.   24-hr recall difficult to get as both patient and husband are poor historians:  B ( 10 AM): oatmeal or grits or cheerios, whole milk, boiled egg, juice 12 0z, coffee with pills  ( she cooks fried eggs and sausage for her husband ) L ( PM): sandwich- banan or peanut butter or cheese Snacks: fruit, grahams. cookie D ( PM): she cooks 2-3 times a week- collards greens, cabbage,  beef, pork, cornbread pizza Beverages: mostly water, ~10- 20 oz juice per day, coffee  Usual physical activity: reads most of day, does not feel safe to walk by herself but would like to walk  Progress Towards Goal(s):  In progress.   Nutritional Diagnosis:  NI-1.4 Inadequate energy intake As related to lack of assistance, memory problems, lack of support, competing values and possibly finances.  As evidenced by her report and weight loss of ~ 15# in past 2 years. .    Intervention:  Nutrition education about how to maximize nutrient dense foods, gave her Glucerna 6 back Goals- weight maintenance, adequate nutrition including protein Coordination of care: Strongly recommend decreasing patient's insulin to avoid low blood sugar and consider adding back a once time a day pill that does not cause low blood sugar like januvia or actos if needed, social work referral for possible community support  Teaching Method Utilized: Environmental health practitioner, Auditory,  Hands on Handouts given during visit include:AVS, signs and symptoms of low blood sugar and how to treat it Barriers to learning/adherence to lifestyle change: competing values- memory problems, both husband and wife seems to need caretakers Demonstrated degree of understanding via:  Teach Back   Monitoring/Evaluation:  Dietary intake, exercise, meter, and body weight in 6 week(s).

## 2015-03-21 NOTE — Assessment & Plan Note (Signed)
Assessment: Patient with history of hyperammonemia that is thought to have contributed to seizure history.  Had previously downtrended and then was rechecked at neurologist in December and was elevated again to 180.  Patient with no symptoms of encephalopathy.  Differential includes GI bleeding, drugs such as diuretics.    Plan: - continue to monitor and check ammonia level today

## 2015-03-21 NOTE — Patient Instructions (Signed)
Keep eating fruits and vegetables every day! They are very good for you!!!  It is important that you get 2 high Protein foods like chicken, [place], fish, eggs, tofu, cheese, milk and yogurt every day.  Try drinking milk one time a day instead of juice, if you prefer you can add ovaltine or chocolate carnation instant breakfast to it for flavor. Or you can eat yogurt for snacks.   Use milk instead of water to make your oatmeal, grits, soups and potatoes.   Eat tuna salad, chicken salad or egg salad for an easy to chew high protein food.

## 2015-03-21 NOTE — Progress Notes (Signed)
Patient ID: [name], female   DOB: [birth year], 75 y.o.   MRN: [id number]   Subjective:   Patient ID: [name] female   DOB: [birth year] 75 y.o.   MRN: [id number]  HPI: [name] is a 75 y.o. woman with PMHx as detailed below presenting to [facility] today for follow up of her DM, hypertension and hyperammonemia.  Please see A&P for status of patient medical conditions addressed at today's visit.    Past Medical History  Diagnosis Date  . History of epistaxis   . Hyperlipidemia   . Telangiectasia     Gastric   . Lichen planus     Both lower extremities  . GERD (gastroesophageal reflux disease)   . Heart murmur   . CHF (congestive heart failure) ([place])   . History of blood transfusion "several"  . Chronic diastolic CHF (congestive heart failure) ([place]) 10/03/2013  . HTN (hypertension), benign 03/02/2012  . Pneumonia 1990's X 2  . Family history of anesthesia complication     "niece has a hard time coming out" (09/15/2012)  . Type II diabetes mellitus ([name]     insulin requiring.  . Seizures ([place]) 09/2014  . Frequent nosebleeds     chronic  . Chronic GI bleeding     [name] 11/29/2014  . Iron deficiency anemia     chronic infusions"  . Chronic anemia   . 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 Gastric AV malformation     /notes 11/29/2014   Current Outpatient Prescriptions  Medication Sig Dispense Refill  . AMITIZA 24 MCG capsule Take 24 mcg by mouth 2 (two) times daily.    . Blood Glucose Monitoring Suppl (ONETOUCH VERIO) W/DEVICE KIT 1 each by Does not apply route 3 (three) times daily. 1 kit 1  . donepezil (ARICEPT) 10 MG tablet Take 1 tablet (10 mg total) by mouth at bedtime. 30 tablet 11  . ferrous fumarate (HEMOCYTE - 106 MG FE) 325 (106 FE) MG TABS tablet Take 1 tablet by mouth daily. Reported on 01/30/2015    . furosemide (LASIX) 20 MG tablet Take 1 tablet (20 mg total) by mouth daily. 30 tablet 11  . glucose blood (ONETOUCH VERIO) test strip  Check blood sugar three times a day 100 each 5  . insulin detemir (LEVEMIR) 100 UNIT/ML injection Inject 0.3 mLs (30 Units total) into the skin at bedtime. (Patient taking differently: Inject 30 Units into the skin 2 (two) times daily. ) 10 mL 11  . levETIRAcetam (KEPPRA) 500 MG tablet Take 1 tablet (500 mg total) by mouth 2 (two) times daily. 60 tablet 11  . metoprolol succinate (TOPROL XL) 25 MG 24 hr tablet Take 0.5 tablets (12.5 mg total) by mouth daily. 30 tablet 11  . ONETOUCH DELICA LANCETS FINE MISC Check blood sugar three times a day 100 each 5  . oxymetazoline (AFRIN) 0.05 % nasal spray Place 1 spray into both nostrils 2 (two) times daily as needed (Epistaxis). (Patient not taking: Reported on 01/30/2015) 30 mL 0  . pantoprazole (PROTONIX) 40 MG tablet Take 1 tablet (40 mg total) by mouth 2 (two) times daily. (Patient taking differently: Take 40 mg by mouth daily. ) 60 tablet 1  . potassium chloride SA (K-DUR,KLOR-CON) 20 MEQ tablet Take 1 tablet (20 mEq total) by mouth daily. 30 tablet 11  . traMADol (ULTRAM) 50 MG tablet Take 50 mg by mouth as needed.     No current facility-administered medications  for this visit.   Facility-Administered Medications Ordered in Other Visits  Medication Dose Route Frequency Provider Last Rate Last Dose  . 0.9 %  sodium chloride infusion  250 mL Intravenous Once [name], MD   250 mL at 02/06/15 1325   Family History  Problem Relation Age of Onset  . Breast cancer    . Malignant hyperthermia Neg Hx   . Seizures Neg Hx   . Stroke Father    Social History   Social History  . Marital Status: Married    Spouse Name: [name]  . Number of Children: 0  . Years of Education: 9   Occupational History  . Retired Engineer, manufacturing systems    Social History Main Topics  . Smoking status: Former Smoker -- 1.00 packs/day for 20 years    Types: Cigarettes    Quit date: 02/10/1971  . Smokeless tobacco: Never Used     Comment: 09/15/2012 "smoked 50-60 yr  ago"  . Alcohol Use: No  . Drug Use: No  . Sexual Activity: No   Other Topics Concern  . None   Social History Narrative   Married, lives with husband, [name].  Ambulates without assistance.     Caffeine use: none   Review of Systems: Review of Systems  Constitutional: Negative for fever and chills.  Respiratory: Negative for cough.   Cardiovascular: Negative for chest pain and leg swelling.  Gastrointestinal: Negative for nausea, vomiting, blood in stool and melena.  Genitourinary: Negative for dysuria.  Neurological: Negative for dizziness, tingling and seizures.    Objective:  Physical Exam: Filed Vitals:   03/21/15 1429  BP: 131/44  Pulse: 71  Temp: 97.7 F (36.5 C)  TempSrc: Oral  Weight: 139 lb 9.6 oz (63.322 kg)  SpO2: 100%   Physical Exam  Constitutional: She is oriented to person, place, and time.  Elderly female, very pleasant, comfortable appearing  HENT:  Head: Normocephalic and atraumatic.  Eyes: EOM are normal.  Cardiovascular: Normal rate and regular rhythm.   3/6 systolic murmur  Pulmonary/Chest: Effort normal and breath sounds normal.  Neurological: She is alert and oriented to person, place, and time.  Skin: Skin is warm and dry.  Psychiatric: She has a normal mood and affect.    Assessment & Plan:   Please see problem list for assessment and plan.  Case discussed with Dr. Lynnae January.

## 2015-03-21 NOTE — Patient Instructions (Signed)
It was nice seeing you today.    We are going to check your ammonia level today just to keep a close eye on it.  You still need to return for your other blood work next week.  Continue taking all your other medicines as prescribed and follow up with Dr. [name] as scheduled and your neurologist as scheduled.

## 2015-03-21 NOTE — Assessment & Plan Note (Signed)
BP Readings from Last 3 Encounters:  03/21/15 131/44  03/18/15 153/51  03/01/15 121/55    Lab Results  Component Value Date   NA 140 01/28/2015   K 3.5 01/28/2015   CREATININE 0.92 01/28/2015    Assessment: Blood pressure control:  Controlled Progress toward BP goal:   At goal Comments: Compliant with metoprolol 12.5 mg daily and furosemide 20 mg daily. She denies any lightheadedness or dizziness.  Plan: Medications:  continue current medications of Lasix and Metoprolol Educational resources provided:

## 2015-03-21 NOTE — Assessment & Plan Note (Signed)
Assessment:  Patient with CBG today of 264 and no recent symptoms of hyper or hypoglycemia. She brought her meter today with average glucose of 293.  Only 37 readings on meter with 3 hypoglycemic readings.  Most readings are high and occur consistently in the AM and PM.  Her A1C today is 6.5 which is increased from 5.4 in October.  Plan: - patient met with Butch Penny Plyler today to discuss nutrition management - patient A1C at goal of < 8 given her age but not sure how frequent transfusions affect her A1C level.  Based on her average glucose according to meter would expect A1C to be higher, but only 37 readings on the meter - encouraged diet and exercise as tolerated - continue Levemir 30units BID - check glucose twice daily before meals

## 2015-03-22 NOTE — Progress Notes (Signed)
Internal Medicine Clinic Attending  Case discussed with Dr. Juleen China at the time of the visit.  We reviewed the resident's history and exam and pertinent patient test results.  I agree with the assessment, diagnosis, and plan of care documented in the resident's note. Her A1C is likely inaccurate as she has several features that would falsely elevated or lower the value. Her iron def anemia and freq blood transfusions would likely elevate the A1C but her blood loss would likely lower the value. No way to know what the final result on the A1C would be - best to adjust DM tx on CBG.

## 2015-03-27 ENCOUNTER — Other Ambulatory Visit: Payer: Self-pay | Admitting: Oncology

## 2015-03-27 ENCOUNTER — Other Ambulatory Visit (INDEPENDENT_AMBULATORY_CARE_PROVIDER_SITE_OTHER): Payer: Medicare Other

## 2015-03-27 ENCOUNTER — Other Ambulatory Visit: Payer: Medicare Other

## 2015-03-27 ENCOUNTER — Telehealth: Payer: Self-pay | Admitting: Internal Medicine

## 2015-03-27 ENCOUNTER — Telehealth: Payer: Self-pay | Admitting: *Deleted

## 2015-03-27 DIAGNOSIS — I780 Hereditary hemorrhagic telangiectasia: Secondary | ICD-10-CM | POA: Diagnosis not present

## 2015-03-27 DIAGNOSIS — D500 Iron deficiency anemia secondary to blood loss (chronic): Secondary | ICD-10-CM

## 2015-03-27 DIAGNOSIS — K5520 Angiodysplasia of colon without hemorrhage: Secondary | ICD-10-CM

## 2015-03-27 DIAGNOSIS — K31819 Angiodysplasia of stomach and duodenum without bleeding: Secondary | ICD-10-CM

## 2015-03-27 DIAGNOSIS — K31811 Angiodysplasia of stomach and duodenum with bleeding: Secondary | ICD-10-CM

## 2015-03-27 DIAGNOSIS — D509 Iron deficiency anemia, unspecified: Secondary | ICD-10-CM

## 2015-03-27 LAB — CBC WITH DIFFERENTIAL/PLATELET
BASOS ABS: 0 10*3/uL (ref 0.0–0.1)
BASOS PCT: 0 %
EOS ABS: 0.4 10*3/uL (ref 0.0–0.7)
EOS PCT: 7 %
HCT: 26.9 % — ABNORMAL LOW (ref 36.0–46.0)
HEMOGLOBIN: 8.4 g/dL — AB (ref 12.0–15.0)
LYMPHS ABS: 0.7 10*3/uL (ref 0.7–4.0)
Lymphocytes Relative: 13 %
MCH: 28 pg (ref 26.0–34.0)
MCHC: 31.2 g/dL (ref 30.0–36.0)
MCV: 89.7 fL (ref 78.0–100.0)
Monocytes Absolute: 0.3 10*3/uL (ref 0.1–1.0)
Monocytes Relative: 6 %
NEUTROS PCT: 74 %
Neutro Abs: 3.7 10*3/uL (ref 1.7–7.7)
PLATELETS: 274 10*3/uL (ref 150–400)
RBC: 3 MIL/uL — AB (ref 3.87–5.11)
RDW: 18.1 % — ABNORMAL HIGH (ref 11.5–15.5)
WBC: 5.1 10*3/uL (ref 4.0–10.5)

## 2015-03-27 LAB — SAMPLE TO BLOOD BANK

## 2015-03-27 LAB — FERRITIN: FERRITIN: 39 ng/mL (ref 11–307)

## 2015-03-27 NOTE — Telephone Encounter (Signed)
Pt requesting lab result. Please call back.

## 2015-03-27 NOTE — Telephone Encounter (Signed)
Appt for 2 units of PRBC's at [place] scheduled Friday @ 0800 AM; pt called - no answer; mailbox full unable to leave message. Will try again.

## 2015-03-27 NOTE — Telephone Encounter (Signed)
Tried to call patient, no VM set-up.  Glenda also calling to give orders for PRBC.  Will close this encounter.

## 2015-03-27 NOTE — Telephone Encounter (Signed)
Talked to pt - informed her of the appt on Friday; need to register at Admissions office prior to the appt.; voiced understanding.

## 2015-03-28 LAB — PREPARE RBC (CROSSMATCH)

## 2015-03-29 ENCOUNTER — Encounter (HOSPITAL_COMMUNITY)
Admission: RE | Admit: 2015-03-29 | Discharge: 2015-03-29 | Disposition: A | Discharge status: Home / Self Care | Payer: Medicare Other | Source: Ambulatory Visit | Attending: Oncology | Admitting: Oncology

## 2015-03-29 VITALS — BP 103/51 | HR 66 | Temp 98.5°F | Resp 20 | Ht 63.0 in | Wt 139.0 lb

## 2015-03-29 DIAGNOSIS — I780 Hereditary hemorrhagic telangiectasia: Secondary | ICD-10-CM

## 2015-03-29 DIAGNOSIS — K5520 Angiodysplasia of colon without hemorrhage: Secondary | ICD-10-CM

## 2015-03-29 DIAGNOSIS — D500 Iron deficiency anemia secondary to blood loss (chronic): Secondary | ICD-10-CM

## 2015-03-29 DIAGNOSIS — Q2733 Arteriovenous malformation of digestive system vessel: Secondary | ICD-10-CM | POA: Diagnosis not present

## 2015-03-29 DIAGNOSIS — D509 Iron deficiency anemia, unspecified: Secondary | ICD-10-CM | POA: Diagnosis not present

## 2015-03-29 MED ORDER — HEPARIN SOD (PORK) LOCK FLUSH 100 UNIT/ML IV SOLN
INTRAVENOUS | Status: AC
  Administered 2015-03-29: 500 [IU]
  Filled 2015-03-29: qty 5

## 2015-03-29 MED ORDER — SODIUM CHLORIDE 0.9 % IV SOLN
Freq: Once | INTRAVENOUS | Status: AC
  Administered 2015-03-29: 09:00:00 via INTRAVENOUS

## 2015-03-30 LAB — TYPE AND SCREEN
ABO/RH(D): A POS
Antibody Screen: NEGATIVE
UNIT DIVISION: 0
Unit division: 0

## 2015-04-10 ENCOUNTER — Other Ambulatory Visit (INDEPENDENT_AMBULATORY_CARE_PROVIDER_SITE_OTHER): Payer: Medicare Other

## 2015-04-10 ENCOUNTER — Telehealth: Payer: Self-pay | Admitting: *Deleted

## 2015-04-10 DIAGNOSIS — D509 Iron deficiency anemia, unspecified: Secondary | ICD-10-CM | POA: Diagnosis not present

## 2015-04-10 DIAGNOSIS — I780 Hereditary hemorrhagic telangiectasia: Secondary | ICD-10-CM

## 2015-04-10 DIAGNOSIS — K31819 Angiodysplasia of stomach and duodenum without bleeding: Secondary | ICD-10-CM

## 2015-04-10 DIAGNOSIS — K31811 Angiodysplasia of stomach and duodenum with bleeding: Secondary | ICD-10-CM

## 2015-04-10 LAB — CBC WITH DIFFERENTIAL/PLATELET
Basophils Absolute: 0 10*3/uL (ref 0.0–0.1)
Basophils Relative: 0 %
EOS PCT: 6 %
Eosinophils Absolute: 0.4 10*3/uL (ref 0.0–0.7)
HCT: 26.6 % — ABNORMAL LOW (ref 36.0–46.0)
Hemoglobin: 8.4 g/dL — ABNORMAL LOW (ref 12.0–15.0)
LYMPHS ABS: 0.8 10*3/uL (ref 0.7–4.0)
LYMPHS PCT: 12 %
MCH: 28.7 pg (ref 26.0–34.0)
MCHC: 31.6 g/dL (ref 30.0–36.0)
MCV: 90.8 fL (ref 78.0–100.0)
MONO ABS: 0.4 10*3/uL (ref 0.1–1.0)
Monocytes Relative: 6 %
Neutro Abs: 5 10*3/uL (ref 1.7–7.7)
Neutrophils Relative %: 76 %
PLATELETS: 225 10*3/uL (ref 150–400)
RBC: 2.93 MIL/uL — ABNORMAL LOW (ref 3.87–5.11)
RDW: 16.3 % — AB (ref 11.5–15.5)
WBC: 6.7 10*3/uL (ref 4.0–10.5)

## 2015-04-10 LAB — SAMPLE TO BLOOD BANK

## 2015-04-10 LAB — FERRITIN: Ferritin: 18 ng/mL (ref 11–307)

## 2015-04-10 NOTE — Telephone Encounter (Signed)
Pt called / informed "blood count ok and no blood transfusion this time" per Dr [name]. Pt stated good news.

## 2015-04-10 NOTE — Telephone Encounter (Signed)
-----   Message from [name], MD sent at 04/10/2015 11:14 AM EST ----- Call pt: blood count OK.  No transfusion this time.

## 2015-04-11 ENCOUNTER — Other Ambulatory Visit: Payer: Self-pay | Admitting: Oncology

## 2015-04-11 ENCOUNTER — Telehealth: Payer: Self-pay | Admitting: *Deleted

## 2015-04-11 DIAGNOSIS — I780 Hereditary hemorrhagic telangiectasia: Secondary | ICD-10-CM

## 2015-04-11 DIAGNOSIS — D500 Iron deficiency anemia secondary to blood loss (chronic): Secondary | ICD-10-CM

## 2015-04-11 NOTE — Telephone Encounter (Signed)
OK I put in orders

## 2015-04-11 NOTE — Telephone Encounter (Signed)
IV iron appt scheduled Wednesday 2/8 @ 1000 AM here at [facility]; pt called, appt information given to her husband.

## 2015-04-16 ENCOUNTER — Other Ambulatory Visit (HOSPITAL_COMMUNITY): Payer: Self-pay | Admitting: *Deleted

## 2015-04-17 ENCOUNTER — Ambulatory Visit (HOSPITAL_COMMUNITY)
Admission: RE | Admit: 2015-04-17 | Discharge: 2015-04-17 | Disposition: A | Discharge status: Home / Self Care | Payer: Medicare Other | Source: Ambulatory Visit | Attending: Oncology | Admitting: Oncology

## 2015-04-17 VITALS — BP 110/39 | HR 65 | Temp 98.2°F | Resp 20

## 2015-04-17 DIAGNOSIS — I780 Hereditary hemorrhagic telangiectasia: Secondary | ICD-10-CM | POA: Insufficient documentation

## 2015-04-17 DIAGNOSIS — D500 Iron deficiency anemia secondary to blood loss (chronic): Secondary | ICD-10-CM | POA: Diagnosis not present

## 2015-04-17 MED ORDER — SODIUM CHLORIDE 0.9 % IV SOLN
510.0000 mg | INTRAVENOUS | Status: DC
  Administered 2015-04-17: 510 mg via INTRAVENOUS
  Filled 2015-04-17: qty 17

## 2015-04-17 NOTE — Discharge Instructions (Signed)

## 2015-04-23 ENCOUNTER — Other Ambulatory Visit (HOSPITAL_COMMUNITY): Payer: Self-pay | Admitting: *Deleted

## 2015-04-24 ENCOUNTER — Other Ambulatory Visit (INDEPENDENT_AMBULATORY_CARE_PROVIDER_SITE_OTHER): Payer: Medicare Other

## 2015-04-24 ENCOUNTER — Telehealth: Payer: Self-pay | Admitting: *Deleted

## 2015-04-24 ENCOUNTER — Ambulatory Visit (HOSPITAL_COMMUNITY)
Admission: RE | Admit: 2015-04-24 | Discharge: 2015-04-24 | Disposition: A | Discharge status: Home / Self Care | Payer: Medicare Other | Source: Ambulatory Visit | Attending: Oncology | Admitting: Oncology

## 2015-04-24 ENCOUNTER — Other Ambulatory Visit: Payer: Self-pay | Admitting: Oncology

## 2015-04-24 VITALS — BP 115/45 | HR 57 | Temp 98.2°F | Resp 20 | Ht 62.0 in | Wt 143.0 lb

## 2015-04-24 DIAGNOSIS — D500 Iron deficiency anemia secondary to blood loss (chronic): Secondary | ICD-10-CM | POA: Insufficient documentation

## 2015-04-24 DIAGNOSIS — K31811 Angiodysplasia of stomach and duodenum with bleeding: Secondary | ICD-10-CM

## 2015-04-24 DIAGNOSIS — I780 Hereditary hemorrhagic telangiectasia: Secondary | ICD-10-CM | POA: Insufficient documentation

## 2015-04-24 DIAGNOSIS — D509 Iron deficiency anemia, unspecified: Secondary | ICD-10-CM

## 2015-04-24 DIAGNOSIS — K31819 Angiodysplasia of stomach and duodenum without bleeding: Secondary | ICD-10-CM

## 2015-04-24 LAB — CBC WITH DIFFERENTIAL/PLATELET
BASOS PCT: 1 %
Basophils Absolute: 0 10*3/uL (ref 0.0–0.1)
EOS ABS: 0.3 10*3/uL (ref 0.0–0.7)
EOS PCT: 7 %
HCT: 21.9 % — ABNORMAL LOW (ref 36.0–46.0)
HEMOGLOBIN: 6.8 g/dL — AB (ref 12.0–15.0)
Lymphocytes Relative: 8 %
Lymphs Abs: 0.4 10*3/uL — ABNORMAL LOW (ref 0.7–4.0)
MCH: 30.8 pg (ref 26.0–34.0)
MCHC: 31.1 g/dL (ref 30.0–36.0)
MCV: 99.1 fL (ref 78.0–100.0)
MONO ABS: 0.4 10*3/uL (ref 0.1–1.0)
Monocytes Relative: 8 %
NEUTROS ABS: 3.3 10*3/uL (ref 1.7–7.7)
Neutrophils Relative %: 76 %
PLATELETS: 169 10*3/uL (ref 150–400)
RBC: 2.21 MIL/uL — ABNORMAL LOW (ref 3.87–5.11)
RDW: 21.6 % — ABNORMAL HIGH (ref 11.5–15.5)
WBC: 4.4 10*3/uL (ref 4.0–10.5)

## 2015-04-24 LAB — FERRITIN: FERRITIN: 119 ng/mL (ref 11–307)

## 2015-04-24 LAB — PREPARE RBC (CROSSMATCH)

## 2015-04-24 MED ORDER — SODIUM CHLORIDE 0.9 % IV SOLN
510.0000 mg | INTRAVENOUS | Status: AC
  Administered 2015-04-24: 510 mg via INTRAVENOUS
  Filled 2015-04-24: qty 17

## 2015-04-24 NOTE — Telephone Encounter (Signed)
Call from lab - pt's hgb 6.8; Dr [name] called and informed.

## 2015-04-24 NOTE — Progress Notes (Signed)
Pt came in today for feraheme  Dr [name] put in orders for Patient to be transfused tomorrow.  Her Blood was drawn in the clinic downstairs and they put the blood band on her.  I got her to sign her consent.  Pt will be here tomorrow for transfusion.  I called and verified with blood bank that they received the blood and the orders.  They could not see the orders to prepare blood.  I discontinued the order and re put them in.ibuprofen called back to blood bank and verified that they now can see the orders.

## 2015-04-25 ENCOUNTER — Ambulatory Visit (HOSPITAL_COMMUNITY)
Admission: RE | Admit: 2015-04-25 | Discharge: 2015-04-25 | Disposition: A | Discharge status: Home / Self Care | Payer: Medicare Other | Source: Ambulatory Visit | Attending: Oncology | Admitting: Oncology

## 2015-04-25 VITALS — BP 157/58 | HR 67 | Temp 98.0°F | Resp 20 | Ht 62.0 in | Wt 140.0 lb

## 2015-04-25 DIAGNOSIS — I780 Hereditary hemorrhagic telangiectasia: Secondary | ICD-10-CM | POA: Diagnosis not present

## 2015-04-25 DIAGNOSIS — D500 Iron deficiency anemia secondary to blood loss (chronic): Secondary | ICD-10-CM | POA: Insufficient documentation

## 2015-04-25 DIAGNOSIS — D509 Iron deficiency anemia, unspecified: Secondary | ICD-10-CM

## 2015-04-25 DIAGNOSIS — K31811 Angiodysplasia of stomach and duodenum with bleeding: Secondary | ICD-10-CM

## 2015-04-25 LAB — SAMPLE TO BLOOD BANK

## 2015-04-25 MED ORDER — SODIUM CHLORIDE 0.9 % IV SOLN: Freq: Once | INTRAVENOUS | Status: DC

## 2015-04-26 LAB — TYPE AND SCREEN
ABO/RH(D): A POS
ANTIBODY SCREEN: NEGATIVE
UNIT DIVISION: 0
Unit division: 0

## 2015-05-08 ENCOUNTER — Ambulatory Visit: Payer: Medicare Other | Admitting: Dietician

## 2015-05-08 ENCOUNTER — Other Ambulatory Visit: Payer: Medicare Other

## 2015-05-09 ENCOUNTER — Ambulatory Visit (INDEPENDENT_AMBULATORY_CARE_PROVIDER_SITE_OTHER): Payer: Medicare Other | Admitting: Dietician

## 2015-05-09 ENCOUNTER — Other Ambulatory Visit (INDEPENDENT_AMBULATORY_CARE_PROVIDER_SITE_OTHER): Payer: Medicare Other

## 2015-05-09 ENCOUNTER — Telehealth: Payer: Self-pay | Admitting: *Deleted

## 2015-05-09 ENCOUNTER — Telehealth: Payer: Self-pay | Admitting: Dietician

## 2015-05-09 DIAGNOSIS — Z794 Long term (current) use of insulin: Secondary | ICD-10-CM

## 2015-05-09 DIAGNOSIS — E11649 Type 2 diabetes mellitus with hypoglycemia without coma: Secondary | ICD-10-CM

## 2015-05-09 DIAGNOSIS — Z6826 Body mass index (BMI) 26.0-26.9, adult: Secondary | ICD-10-CM

## 2015-05-09 DIAGNOSIS — K31819 Angiodysplasia of stomach and duodenum without bleeding: Secondary | ICD-10-CM

## 2015-05-09 DIAGNOSIS — K31811 Angiodysplasia of stomach and duodenum with bleeding: Secondary | ICD-10-CM

## 2015-05-09 DIAGNOSIS — I780 Hereditary hemorrhagic telangiectasia: Secondary | ICD-10-CM

## 2015-05-09 DIAGNOSIS — Z713 Dietary counseling and surveillance: Secondary | ICD-10-CM

## 2015-05-09 DIAGNOSIS — D509 Iron deficiency anemia, unspecified: Secondary | ICD-10-CM

## 2015-05-09 DIAGNOSIS — IMO0002 Reserved for concepts with insufficient information to code with codable children: Secondary | ICD-10-CM

## 2015-05-09 DIAGNOSIS — E1165 Type 2 diabetes mellitus with hyperglycemia: Principal | ICD-10-CM

## 2015-05-09 DIAGNOSIS — E1129 Type 2 diabetes mellitus with other diabetic kidney complication: Secondary | ICD-10-CM

## 2015-05-09 LAB — CBC WITH DIFFERENTIAL/PLATELET
Basophils Absolute: 0 10*3/uL (ref 0.0–0.1)
Basophils Relative: 1 %
EOS PCT: 9 %
Eosinophils Absolute: 0.3 10*3/uL (ref 0.0–0.7)
HCT: 27.7 % — ABNORMAL LOW (ref 36.0–46.0)
HEMOGLOBIN: 9.1 g/dL — AB (ref 12.0–15.0)
LYMPHS ABS: 0.4 10*3/uL — AB (ref 0.7–4.0)
LYMPHS PCT: 11 %
MCH: 31.2 pg (ref 26.0–34.0)
MCHC: 32.9 g/dL (ref 30.0–36.0)
MCV: 94.9 fL (ref 78.0–100.0)
MONOS PCT: 9 %
Monocytes Absolute: 0.3 10*3/uL (ref 0.1–1.0)
Neutro Abs: 2.6 10*3/uL (ref 1.7–7.7)
Neutrophils Relative %: 70 %
PLATELETS: 201 10*3/uL (ref 150–400)
RBC: 2.92 MIL/uL — AB (ref 3.87–5.11)
RDW: 16.3 % — ABNORMAL HIGH (ref 11.5–15.5)
WBC: 3.7 10*3/uL — AB (ref 4.0–10.5)

## 2015-05-09 LAB — FERRITIN: Ferritin: 80 ng/mL (ref 11–307)

## 2015-05-09 LAB — SAMPLE TO BLOOD BANK

## 2015-05-09 NOTE — Patient Instructions (Addendum)
Keep up the great work on eating healthy!! Your weight is 144.4 pounds today.   Lower your LEVEMIR insulin to 20 units two times a day  Protein Non meat Sources of protein include:  Eggs, yogurt, cottage cheese, beans, such as black beans or kidney beans, or other legumes, such as lentils and split peas. Soy products. Nuts, such as almonds, [place] nuts, and pecans. Seeds, such as sunflower seeds. Tofu, tempeh, and hummus, milk, and cheese.  To stay regular try bran cereal or prunes once a day    .

## 2015-05-09 NOTE — Telephone Encounter (Signed)
-----   Message from [name], MD sent at 05/09/2015 11:53 AM EDT ----- Call pt: blood count OK; no transfusion needed this week

## 2015-05-09 NOTE — Telephone Encounter (Signed)
Pt called / informed blood count OK, hgb 9.1 and no need for transfusion this week per Dr [name]. "Good news and thank-you so very much".

## 2015-05-09 NOTE — Progress Notes (Signed)
  Medical Nutrition Therapy:  Appt start time: 0935 end time:  1020. Visit # 2  Assessment:  Primary concerns today: patient has no concerns [name] is here with her husband today. She was referred for better eating habits, weight stabilization/loss. She says she is cooking and eating more in teh past month. She has been drinking ~ 1 Boost each day. bowels are fairly regular.  They do not think they need any additional help. Ger walks a few days a week in the church across the street. Her weight is increased nicely ~ 1#/week over the past 5-6 weeks. Her blood sugar was 63 this am before leaving home- she treated with apple juice and was 124 here in the office.   ANTHROPOMETRICS: weight-144.4# #, increased 4.8 # from last visit ~ 6 weeks ago, BMI -  Now 26  BLOOD SUGAR: blood sugars are much less variable and less highs now, but continues with significant lows despite improved intake. 24% of her readings ( 5/21) less than 80 in past 30 days, 21% in past 90 days.  DIETARY INTAKE: Usual eating pattern now 3 meals and 1-2 snack daily.   24-hr recall :  B ( 10 AM): oatmeal or grits (made with milk) or cheerios, whole milk, boiled egg, toast with butter, small cup of juice or whole milk, coffee    L ( PM): sandwich- bologna or peanut butter or cheese, strawberries, green tea D ( PM): she cooks most nights except sundays: fried fish, collards greens, cabbage, beef, pork, cornbread pizza Snacks: fruit, grahams. Cookie, boost in the evenings. Sometimes ice cream Beverages: mostly water, ~has cut down on juice to once in a while, green tea- regular, coffee  Usual physical activity: reads most of day, does not feel safe to walk by herself but would like to walk  Progress Towards Goal(s):  Some progress.   Nutritional Diagnosis:  NI-1.4 Inadequate energy intake As related to lack of assistance, memory problems, lack of support, competing values and possibly finances imrpoving  As evidenced by her  report and weight gain of ~ 1 #/week for past  month.     Intervention:  Nutrition education about how to maximize nutrient dense foods, Goals- weight maintenance, adequate nutrition and stable and safe blood sugar Coordination of care: Strongly recommend decreasing patient's insulin to 20- units twice a day now, and insulin once a day in the morning  and add januvia to avoid low blood sugar ( <90 for her)   Teaching Method Utilized: Visual, Auditory,  Hands on Handouts given during visit include:AVS, signs and symptoms of low blood sugar and how to treat it Barriers to learning/adherence to lifestyle change: competing values- memory problems, both husband and wife seems to need caretakers Demonstrated degree of understanding via:  Teach Back   Monitoring/Evaluation:  Dietary intake, exercise, meter, and body weight in 10 week(s).

## 2015-05-09 NOTE — Telephone Encounter (Addendum)
Received inbasket note from Dr. Juleen China to call patient to decrease her levmir insulin to 20 units twice daly. Called patient and instructed her on same. She verbalized understating using teach back twice while on the phone. I will also mail this to her today in my instructions from her visit today.

## 2015-05-14 ENCOUNTER — Telehealth: Payer: Self-pay | Admitting: Internal Medicine

## 2015-05-14 NOTE — Telephone Encounter (Signed)
Called cary back, lm for rtc

## 2015-05-14 NOTE — Telephone Encounter (Signed)
Calcium NEEDS UPDATE OF A1C, [phone], EXT [phone]

## 2015-05-22 ENCOUNTER — Other Ambulatory Visit (INDEPENDENT_AMBULATORY_CARE_PROVIDER_SITE_OTHER): Payer: Medicare Other

## 2015-05-22 ENCOUNTER — Telehealth: Payer: Self-pay | Admitting: *Deleted

## 2015-05-22 DIAGNOSIS — D509 Iron deficiency anemia, unspecified: Secondary | ICD-10-CM

## 2015-05-22 DIAGNOSIS — K31811 Angiodysplasia of stomach and duodenum with bleeding: Secondary | ICD-10-CM

## 2015-05-22 DIAGNOSIS — K31819 Angiodysplasia of stomach and duodenum without bleeding: Secondary | ICD-10-CM

## 2015-05-22 DIAGNOSIS — I780 Hereditary hemorrhagic telangiectasia: Secondary | ICD-10-CM

## 2015-05-22 DIAGNOSIS — Q2733 Arteriovenous malformation of digestive system vessel: Secondary | ICD-10-CM | POA: Diagnosis not present

## 2015-05-22 LAB — CBC WITH DIFFERENTIAL/PLATELET
Basophils Absolute: 0 10*3/uL (ref 0.0–0.1)
Basophils Relative: 1 %
EOS PCT: 11 %
Eosinophils Absolute: 0.4 10*3/uL (ref 0.0–0.7)
HEMATOCRIT: 25 % — AB (ref 36.0–46.0)
HEMOGLOBIN: 8.4 g/dL — AB (ref 12.0–15.0)
LYMPHS ABS: 0.6 10*3/uL — AB (ref 0.7–4.0)
LYMPHS PCT: 15 %
MCH: 31.6 pg (ref 26.0–34.0)
MCHC: 33.6 g/dL (ref 30.0–36.0)
MCV: 94 fL (ref 78.0–100.0)
Monocytes Absolute: 0.3 10*3/uL (ref 0.1–1.0)
Monocytes Relative: 8 %
NEUTROS PCT: 65 %
Neutro Abs: 2.6 10*3/uL (ref 1.7–7.7)
Platelets: 176 10*3/uL (ref 150–400)
RBC: 2.66 MIL/uL — AB (ref 3.87–5.11)
RDW: 14.6 % (ref 11.5–15.5)
WBC: 4 10*3/uL (ref 4.0–10.5)

## 2015-05-22 LAB — SAMPLE TO BLOOD BANK

## 2015-05-22 LAB — FERRITIN: Ferritin: 29 ng/mL (ref 11–307)

## 2015-05-22 NOTE — Telephone Encounter (Signed)
Pt called / informed no transfusion (Hgb 8.4) this week but likely next week per Dr [name].  Appt scheduled Wed 4/19 @ 0900 am.

## 2015-05-22 NOTE — Telephone Encounter (Signed)
-----   Message from [name], MD sent at 05/22/2015  2:11 PM EDT ----- Call pt: no transfusion this week - will likely need one next week. Please get CBC next Wed @ [facility]

## 2015-05-29 ENCOUNTER — Other Ambulatory Visit: Payer: Self-pay | Admitting: Oncology

## 2015-05-29 ENCOUNTER — Other Ambulatory Visit (INDEPENDENT_AMBULATORY_CARE_PROVIDER_SITE_OTHER): Payer: Medicare Other

## 2015-05-29 ENCOUNTER — Telehealth: Payer: Self-pay | Admitting: *Deleted

## 2015-05-29 DIAGNOSIS — I780 Hereditary hemorrhagic telangiectasia: Secondary | ICD-10-CM

## 2015-05-29 DIAGNOSIS — D509 Iron deficiency anemia, unspecified: Secondary | ICD-10-CM

## 2015-05-29 DIAGNOSIS — K31811 Angiodysplasia of stomach and duodenum with bleeding: Secondary | ICD-10-CM

## 2015-05-29 DIAGNOSIS — K31819 Angiodysplasia of stomach and duodenum without bleeding: Secondary | ICD-10-CM

## 2015-05-29 LAB — CBC WITH DIFFERENTIAL/PLATELET
BASOS ABS: 0 10*3/uL (ref 0.0–0.1)
Basophils Relative: 0 %
Eosinophils Absolute: 0.4 10*3/uL (ref 0.0–0.7)
Eosinophils Relative: 8 %
HEMATOCRIT: 23.1 % — AB (ref 36.0–46.0)
HEMOGLOBIN: 7.6 g/dL — AB (ref 12.0–15.0)
LYMPHS ABS: 0.9 10*3/uL (ref 0.7–4.0)
LYMPHS PCT: 17 %
MCH: 30.6 pg (ref 26.0–34.0)
MCHC: 32.9 g/dL (ref 30.0–36.0)
MCV: 93.1 fL (ref 78.0–100.0)
Monocytes Absolute: 0.4 10*3/uL (ref 0.1–1.0)
Monocytes Relative: 9 %
NEUTROS ABS: 3.4 10*3/uL (ref 1.7–7.7)
NEUTROS PCT: 66 %
PLATELETS: 167 10*3/uL (ref 150–400)
RBC: 2.48 MIL/uL — AB (ref 3.87–5.11)
RDW: 14.8 % (ref 11.5–15.5)
WBC: 5.2 10*3/uL (ref 4.0–10.5)

## 2015-05-29 LAB — SAMPLE TO BLOOD BANK

## 2015-05-29 NOTE — Telephone Encounter (Signed)
Pt called - talked to her husband; appt for blood transfusion on Friday 4/21 @ 1130AM at the [place].

## 2015-05-31 ENCOUNTER — Ambulatory Visit (HOSPITAL_BASED_OUTPATIENT_CLINIC_OR_DEPARTMENT_OTHER): Payer: Medicare Other

## 2015-05-31 ENCOUNTER — Other Ambulatory Visit: Payer: Medicare Other

## 2015-05-31 ENCOUNTER — Ambulatory Visit (HOSPITAL_COMMUNITY)
Admission: RE | Admit: 2015-05-31 | Discharge: 2015-05-31 | Disposition: A | Discharge status: Home / Self Care | Payer: Medicare Other | Source: Ambulatory Visit | Attending: Oncology | Admitting: Oncology

## 2015-05-31 VITALS — BP 117/45 | HR 58 | Temp 98.1°F | Resp 18

## 2015-05-31 DIAGNOSIS — K31811 Angiodysplasia of stomach and duodenum with bleeding: Secondary | ICD-10-CM

## 2015-05-31 DIAGNOSIS — I780 Hereditary hemorrhagic telangiectasia: Secondary | ICD-10-CM

## 2015-05-31 LAB — PREPARE RBC (CROSSMATCH)

## 2015-05-31 MED ORDER — SODIUM CHLORIDE 0.9 % IV SOLN
250.0000 mL | Freq: Once | INTRAVENOUS | Status: AC
  Administered 2015-05-31: 250 mL via INTRAVENOUS

## 2015-05-31 MED ORDER — SODIUM CHLORIDE 0.9% FLUSH
10.0000 mL | INTRAVENOUS | Status: AC | PRN
  Administered 2015-05-31: 10 mL
  Filled 2015-05-31: qty 10

## 2015-05-31 MED ORDER — HEPARIN SOD (PORK) LOCK FLUSH 100 UNIT/ML IV SOLN
500.0000 [IU] | Freq: Every day | INTRAVENOUS | Status: AC | PRN
  Administered 2015-05-31: 500 [IU]
  Filled 2015-05-31: qty 5

## 2015-05-31 NOTE — Patient Instructions (Signed)

## 2015-06-03 DIAGNOSIS — I780 Hereditary hemorrhagic telangiectasia: Secondary | ICD-10-CM | POA: Diagnosis not present

## 2015-06-03 LAB — TYPE AND SCREEN
ABO/RH(D): A POS
Antibody Screen: NEGATIVE
Unit division: 0
Unit division: 0

## 2015-06-05 ENCOUNTER — Other Ambulatory Visit (INDEPENDENT_AMBULATORY_CARE_PROVIDER_SITE_OTHER): Payer: Medicare Other

## 2015-06-05 ENCOUNTER — Telehealth: Payer: Self-pay | Admitting: *Deleted

## 2015-06-05 DIAGNOSIS — I780 Hereditary hemorrhagic telangiectasia: Secondary | ICD-10-CM

## 2015-06-05 DIAGNOSIS — K31811 Angiodysplasia of stomach and duodenum with bleeding: Secondary | ICD-10-CM

## 2015-06-05 DIAGNOSIS — D509 Iron deficiency anemia, unspecified: Secondary | ICD-10-CM

## 2015-06-05 DIAGNOSIS — K31819 Angiodysplasia of stomach and duodenum without bleeding: Secondary | ICD-10-CM

## 2015-06-05 LAB — CBC WITH DIFFERENTIAL/PLATELET
BASOS ABS: 0 10*3/uL (ref 0.0–0.1)
BASOS PCT: 1 %
EOS PCT: 7 %
Eosinophils Absolute: 0.4 10*3/uL (ref 0.0–0.7)
HCT: 32.2 % — ABNORMAL LOW (ref 36.0–46.0)
Hemoglobin: 10.7 g/dL — ABNORMAL LOW (ref 12.0–15.0)
Lymphocytes Relative: 13 %
Lymphs Abs: 0.7 10*3/uL (ref 0.7–4.0)
MCH: 29.6 pg (ref 26.0–34.0)
MCHC: 33.2 g/dL (ref 30.0–36.0)
MCV: 89.2 fL (ref 78.0–100.0)
MONO ABS: 0.5 10*3/uL (ref 0.1–1.0)
Monocytes Relative: 9 %
Neutro Abs: 3.8 10*3/uL (ref 1.7–7.7)
Neutrophils Relative %: 70 %
PLATELETS: 155 10*3/uL (ref 150–400)
RBC: 3.61 MIL/uL — AB (ref 3.87–5.11)
RDW: 13.9 % (ref 11.5–15.5)
WBC: 5.5 10*3/uL (ref 4.0–10.5)

## 2015-06-05 LAB — SAMPLE TO BLOOD BANK

## 2015-06-05 LAB — FERRITIN: FERRITIN: 32 ng/mL (ref 11–307)

## 2015-06-05 NOTE — Telephone Encounter (Signed)
-----   Message from [name], MD sent at 06/05/2015  1:07 PM EDT ----- Call blood count good after transfusion

## 2015-06-05 NOTE — Telephone Encounter (Signed)
Called pt - talked to her husband ; Blood count good after transfusion (hgb 10.7) per Dr [name]. "Sound good" ,he will inform pt.

## 2015-06-19 ENCOUNTER — Other Ambulatory Visit: Payer: Self-pay | Admitting: Oncology

## 2015-06-19 ENCOUNTER — Telehealth: Payer: Self-pay | Admitting: Internal Medicine

## 2015-06-19 ENCOUNTER — Other Ambulatory Visit (INDEPENDENT_AMBULATORY_CARE_PROVIDER_SITE_OTHER): Payer: Medicare Other

## 2015-06-19 DIAGNOSIS — K31819 Angiodysplasia of stomach and duodenum without bleeding: Secondary | ICD-10-CM

## 2015-06-19 DIAGNOSIS — D509 Iron deficiency anemia, unspecified: Secondary | ICD-10-CM

## 2015-06-19 DIAGNOSIS — K31811 Angiodysplasia of stomach and duodenum with bleeding: Secondary | ICD-10-CM

## 2015-06-19 DIAGNOSIS — I780 Hereditary hemorrhagic telangiectasia: Secondary | ICD-10-CM

## 2015-06-19 LAB — SAMPLE TO BLOOD BANK

## 2015-06-19 LAB — CBC WITH DIFFERENTIAL/PLATELET
BASOS ABS: 0 10*3/uL (ref 0.0–0.1)
Basophils Relative: 1 %
Eosinophils Absolute: 0.4 10*3/uL (ref 0.0–0.7)
Eosinophils Relative: 7 %
HEMATOCRIT: 24.2 % — AB (ref 36.0–46.0)
HEMOGLOBIN: 7.9 g/dL — AB (ref 12.0–15.0)
LYMPHS PCT: 14 %
Lymphs Abs: 0.8 10*3/uL (ref 0.7–4.0)
MCH: 29.5 pg (ref 26.0–34.0)
MCHC: 32.6 g/dL (ref 30.0–36.0)
MCV: 90.3 fL (ref 78.0–100.0)
Monocytes Absolute: 0.5 10*3/uL (ref 0.1–1.0)
Monocytes Relative: 8 %
NEUTROS ABS: 4 10*3/uL (ref 1.7–7.7)
Neutrophils Relative %: 70 %
Platelets: 252 10*3/uL (ref 150–400)
RBC: 2.68 MIL/uL — AB (ref 3.87–5.11)
RDW: 13.8 % (ref 11.5–15.5)
WBC: 5.8 10*3/uL (ref 4.0–10.5)

## 2015-06-19 LAB — FERRITIN: Ferritin: 10 ng/mL — ABNORMAL LOW (ref 11–307)

## 2015-06-19 NOTE — Telephone Encounter (Signed)
APT. REMINDER CALL, NO ANSWER, MAILBOX FULL °

## 2015-06-20 ENCOUNTER — Ambulatory Visit (INDEPENDENT_AMBULATORY_CARE_PROVIDER_SITE_OTHER): Payer: Medicare Other | Admitting: Internal Medicine

## 2015-06-20 ENCOUNTER — Telehealth: Payer: Self-pay | Admitting: *Deleted

## 2015-06-20 VITALS — BP 133/36 | HR 67 | Wt 139.1 lb

## 2015-06-20 DIAGNOSIS — I10 Essential (primary) hypertension: Secondary | ICD-10-CM | POA: Diagnosis not present

## 2015-06-20 DIAGNOSIS — E2839 Other primary ovarian failure: Secondary | ICD-10-CM

## 2015-06-20 DIAGNOSIS — R413 Other amnesia: Secondary | ICD-10-CM

## 2015-06-20 DIAGNOSIS — E1129 Type 2 diabetes mellitus with other diabetic kidney complication: Secondary | ICD-10-CM | POA: Diagnosis not present

## 2015-06-20 DIAGNOSIS — N289 Disorder of kidney and ureter, unspecified: Secondary | ICD-10-CM | POA: Diagnosis not present

## 2015-06-20 DIAGNOSIS — E1165 Type 2 diabetes mellitus with hyperglycemia: Secondary | ICD-10-CM | POA: Diagnosis not present

## 2015-06-20 DIAGNOSIS — Z0000 Encounter for general adult medical examination without abnormal findings: Secondary | ICD-10-CM | POA: Insufficient documentation

## 2015-06-20 DIAGNOSIS — Z794 Long term (current) use of insulin: Secondary | ICD-10-CM

## 2015-06-20 DIAGNOSIS — L989 Disorder of the skin and subcutaneous tissue, unspecified: Secondary | ICD-10-CM

## 2015-06-20 DIAGNOSIS — L821 Other seborrheic keratosis: Secondary | ICD-10-CM | POA: Insufficient documentation

## 2015-06-20 DIAGNOSIS — IMO0002 Reserved for concepts with insufficient information to code with codable children: Secondary | ICD-10-CM

## 2015-06-20 LAB — POCT GLYCOSYLATED HEMOGLOBIN (HGB A1C): HEMOGLOBIN A1C: 7.1

## 2015-06-20 LAB — GLUCOSE, CAPILLARY: GLUCOSE-CAPILLARY: 297 mg/dL — AB (ref 65–99)

## 2015-06-20 MED ORDER — DONEPEZIL HCL 10 MG PO TABS: 10.0000 mg | ORAL_TABLET | Freq: Every day | ORAL | Status: DC

## 2015-06-20 MED ORDER — GLIPIZIDE 5 MG PO TABS: 2.5000 mg | ORAL_TABLET | Freq: Every day | ORAL | Status: DC

## 2015-06-20 MED ORDER — INSULIN DETEMIR 100 UNIT/ML ~~LOC~~ SOLN: 20.0000 [IU] | Freq: Two times a day (BID) | SUBCUTANEOUS | Status: DC

## 2015-06-20 NOTE — Progress Notes (Signed)
Patient ID: [name], female   DOB: 09/19/1940, 75 y.o.   MRN: [id number]    Subjective:   Patient ID: [name] female   DOB: 02/21/1940 75 y.o.   MRN: [id number]  HPI: [name] is a 75 y.o. woman with past medical history detailed below presenting for follow up of her DM, HTN, memory loss, and with acute complaint of skin mole.  Please see A&P for status of patients medical conditions addressed today.    Past Medical History  Diagnosis Date  . History of epistaxis   . Hyperlipidemia   . Telangiectasia     Gastric   . Lichen planus     Both lower extremities  . GERD (gastroesophageal reflux disease)   . Heart murmur   . CHF (congestive heart failure) ([place])   . History of blood transfusion "several"  . Chronic diastolic CHF (congestive heart failure) ([place]) 10/03/2013  . HTN (hypertension), benign 03/02/2012  . Pneumonia 1990's X 2  . Family history of anesthesia complication     "niece has a hard time coming out" (09/15/2012)  . Type II diabetes mellitus ([name]     insulin requiring.  . Seizures ([place]) 09/2014  . Frequent nosebleeds     chronic  . Chronic GI bleeding     [name] 11/29/2014  . Iron deficiency anemia     chronic infusions"  . Chronic anemia   . 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 Gastric AV malformation     /notes 11/29/2014   Current Outpatient Prescriptions  Medication Sig Dispense Refill  . AMITIZA 24 MCG capsule Take 24 mcg by mouth 2 (two) times daily.    . Blood Glucose Monitoring Suppl (ONETOUCH VERIO) W/DEVICE KIT 1 each by Does not apply route 3 (three) times daily. 1 kit 1  . donepezil (ARICEPT) 10 MG tablet Take 1 tablet (10 mg total) by mouth at bedtime. 30 tablet 11  . ferrous fumarate (HEMOCYTE - 106 MG FE) 325 (106 FE) MG TABS tablet Take 1 tablet by mouth daily. Reported on 01/30/2015    . furosemide (LASIX) 20 MG tablet Take 1 tablet (20 mg total) by mouth daily. 30 tablet 11  . glipiZIDE (GLUCOTROL) 5 MG  tablet Take 0.5 tablets (2.5 mg total) by mouth daily before breakfast. 30 tablet 2  . glucose blood (ONETOUCH VERIO) test strip Check blood sugar three times a day 100 each 5  . insulin detemir (LEVEMIR) 100 UNIT/ML injection Inject 0.3 mLs (30 Units total) into the skin at bedtime. (Patient taking differently: Inject 20 Units into the skin 2 (two) times daily. ) 10 mL 11  . levETIRAcetam (KEPPRA) 500 MG tablet Take 1 tablet (500 mg total) by mouth 2 (two) times daily. 60 tablet 11  . metoprolol succinate (TOPROL XL) 25 MG 24 hr tablet Take 0.5 tablets (12.5 mg total) by mouth daily. 30 tablet 11  . ONETOUCH DELICA LANCETS FINE MISC Check blood sugar three times a day 100 each 5  . oxymetazoline (AFRIN) 0.05 % nasal spray Place 1 spray into both nostrils 2 (two) times daily as needed (Epistaxis). (Patient not taking: Reported on 01/30/2015) 30 mL 0  . pantoprazole (PROTONIX) 40 MG tablet Take 1 tablet (40 mg total) by mouth 2 (two) times daily. (Patient taking differently: Take 40 mg by mouth daily. ) 60 tablet 1  . potassium chloride SA (K-DUR,KLOR-CON) 20 MEQ tablet Take 1 tablet (20 mEq total) by mouth daily.  30 tablet 11  . traMADol (ULTRAM) 50 MG tablet Take 50 mg by mouth as needed.     No current facility-administered medications for this visit.   Facility-Administered Medications Ordered in Other Visits  Medication Dose Route Frequency Provider Last Rate Last Dose  . 0.9 %  sodium chloride infusion  250 mL Intravenous Once [name], MD   250 mL at 02/06/15 1325   Family History  Problem Relation Age of Onset  . Breast cancer    . Malignant hyperthermia Neg Hx   . Seizures Neg Hx   . Stroke Father    Social History   Social History  . Marital Status: Married    Spouse Name: [name]  . Number of Children: 0  . Years of Education: 9   Occupational History  . Retired Engineer, manufacturing systems    Social History Main Topics  . Smoking status: Former Smoker -- 1.00 packs/day for 20  years    Types: Cigarettes    Quit date: 02/10/1971  . Smokeless tobacco: Never Used     Comment: 09/15/2012 "smoked 50-60 yr ago"  . Alcohol Use: No  . Drug Use: No  . Sexual Activity: No   Other Topics Concern  . Not on file   Social History Narrative   Married, lives with husband, [name].  Ambulates without assistance.     Caffeine use: none   Review of Systems: Review of Systems  Constitutional: Negative for fever and chills.  Respiratory: Negative for cough and shortness of breath.   Cardiovascular: Negative for chest pain.  Gastrointestinal: Negative for nausea, vomiting and abdominal pain.  Genitourinary: Negative for dysuria.  Musculoskeletal: Negative for falls.  Skin:       + skin mole  Neurological: Negative for headaches.    Objective:  Physical Exam: Filed Vitals:   06/20/15 1348  BP: 133/36  Pulse: 67  Weight: 139 lb 1.6 oz (63.095 kg)  SpO2: 100%   Physical Exam  Constitutional:  Very pleasant, AA female, sitting in wheelchair, accompanied by husband.  HENT:  Head: Normocephalic and atraumatic.  Eyes: EOM are normal.  Pulmonary/Chest: Effort normal.  Neurological: She is alert.  Skin:  Seborrheic keratotic lesion on Right tricep area     Assessment & Plan:  Case discussed with Dr. Eppie Gibson.  HTN (hypertension), benign BP Readings from Last 3 Encounters:  06/20/15 133/36  05/31/15 117/45  04/25/15 157/58   BMP Latest Ref Rng 01/28/2015 11/30/2014 11/29/2014  Glucose 65 - 99 mg/dL 135(H) 127(H) 195(H)  BUN 7 - 25 mg/dL '16 9 15  '$ Creatinine 0.60 - 0.93 mg/dL 0.92 0.80 0.94  Sodium 135 - 146 mmol/L 140 143 143  Potassium 3.5 - 5.3 mmol/L 3.5 3.9 3.8  Chloride 98 - 110 mmol/L 107 110 112(H)  CO2 20 - 31 mmol/L '27 26 24  '$ Calcium 8.6 - 10.4 mg/dL 8.7 8.4(L) 8.4(L)   Assessment: Patient is compliant with 2 class blood pressure regimen of Lasix and Metoprolol. She denies any symptoms of lightheadedness or dizziness.  Plan: - continue current  regimen, Lasix '20mg'$  daily and Metoprolol 12.'5mg'$  daily - rtc 1 month for DM follow up   Memory loss Assessment: Patient reports problems with memory loss and husband affirms this although says she has not been deteriorating much lately.  Was seen by Neurology in Dec 2016 where a MoCA was done and she scored 15/30, consistent with severe memory loss.  She had difficulty remembering that she had come to get blood  work yesterday.  At neurology visit she was started on Aricept and planned for follow up in 6 months where Namenda was likely to be added.   Plan: - patient has appt with neurology in June, encouraged her to follow up with them - continued her Aricept  DM (diabetes mellitus), type 2, uncontrolled, with renal complications ([place]) Assessment: Patient A1c today is 7.1 which is increased from previous readings but is also likely inaccurate based on her frequent blood transfusion requirement.  Her CBG today is 297.  She brought her glucometer to clinic this afternoon and it shows mostly elevated blood sugars with a couple of lows <80.  Particularly, her meter shows some post-prandial spikes greater than 400.  She was previously on Levemir 30units BID but this was decreased in March 2017 due to some lows noted on her meter when she met with Debera Lat.  She used to be on Metformin as well but reports taking herself off this medication.  I spoke with patient regarding restarting Metformin and she was reluctant to do so because she reports it made her lose a lot of weight and she is concerned about her kidney function (last BMET with normal SCr and EGFR > 60).  She is also reluctant to start Novolog with meals to address the post-prandial high glucose because of having to keep that strict of a schedule and having to eat shortly after giving it.  She is agreeable to starting a once daily dose of Glipizide to help with her high sugars.  While not the perfect solution to her glucose control and given her  memory loss, I would also be hesitant as well to start her on Novolog TID with meals.  Plan: - continue Levemir 20units BID - continue blood glucose monitoring BID - start glipizide 2.'5mg'$  PO daily.  Based on response, can increase as appropriate - she is no longer on Ace inhibitor (stopped by cardiology in Oct 2016) due to low BP so will check a urine microalbumin - rtc 1 month for follow up  Healthcare maintenance Assessment/Plan: Patient due for DEXA and urine microalbumin.  Plan: - DEXA ordered - check urine microalbumin  Seborrheic keratosis Assessment: Patient has a dime-sized lesion on right tricep consistent with SK.  Appears waxy, pasted-on in appearance.  States it is not painful but can sometimes be pruritic and catch on her clothing.  She also has a small, cystic appearing lesion on her right wrist that is not bothersome.  Both have been present for a number of years.  Plan: - liquid nitrogen treatment done today - rtc in 1 month likely to do another treatment - refer to dermatology in the future if needed or if lesion evolves

## 2015-06-20 NOTE — Assessment & Plan Note (Signed)
Assessment: Patient has a dime-sized lesion on right tricep consistent with SK.  Appears waxy, pasted-on in appearance.  States it is not painful but can sometimes be pruritic and catch on her clothing.  She also has a small, cystic appearing lesion on her right wrist that is not bothersome.  Both have been present for a number of years.  Plan: - liquid nitrogen treatment done today - rtc in 1 month likely to do another treatment - refer to dermatology in the future if needed or if lesion evolves

## 2015-06-20 NOTE — Patient Instructions (Signed)
Thank you for coming to see me today. It was a pleasure. Today we talked about:   Your Diabetes: - continue taking Levemir 20units twice per day - continue checking your blood sugar twice daily - we started a new medication today.  It is called Glipizide.  You will take 2.5mg  once daily  Health Maintenance: - I ordered a DEXA scan to make sure your bones are healthy and you do not have osteoporosis - We are checking your urine and some blood work  Please follow-up with me or one of our other providers in 4 weeks to keep freezing your mole and check on your diabetes  If you have any questions or concerns, please do not hesitate to call the office at [phone].  Take Care,   [name], DO

## 2015-06-20 NOTE — Assessment & Plan Note (Signed)
Assessment/Plan: Patient due for DEXA and urine microalbumin.  Plan: - DEXA ordered - check urine microalbumin

## 2015-06-20 NOTE — Telephone Encounter (Signed)
-----   Message from [name], MD sent at 06/19/2015 10:47 AM EDT ----- Call pt: does not need blood this week but will probably need it next week. See if she can come in for a CBC/type & screen on Monday instead of Wednesday next week.

## 2015-06-20 NOTE — Assessment & Plan Note (Signed)
Assessment: Patient reports problems with memory loss and husband affirms this although says she has not been deteriorating much lately.  Was seen by Neurology in Dec 2016 where a MoCA was done and she scored 15/30, consistent with severe memory loss.  She had difficulty remembering that she had come to get blood work yesterday.  At neurology visit she was started on Aricept and planned for follow up in 6 months where Namenda was likely to be added.   Plan: - patient has appt with neurology in June, encouraged her to follow up with them - continued her Aricept

## 2015-06-20 NOTE — Telephone Encounter (Signed)
Pt called - talked to her husband, informed pt does not need blood this week , Hgb 7.9 , but probably next week per Dr [name]. Stated she can come in on Monday the 15th @ 1000 AM For lab.

## 2015-06-20 NOTE — Assessment & Plan Note (Signed)
BP Readings from Last 3 Encounters:  06/20/15 133/36  05/31/15 117/45  04/25/15 157/58   BMP Latest Ref Rng 01/28/2015 11/30/2014 11/29/2014  Glucose 65 - 99 mg/dL 135(H) 127(H) 195(H)  BUN 7 - 25 mg/dL 16 9 15   Creatinine 0.60 - 0.93 mg/dL 0.92 0.80 0.94  Sodium 135 - 146 mmol/L 140 143 143  Potassium 3.5 - 5.3 mmol/L 3.5 3.9 3.8  Chloride 98 - 110 mmol/L 107 110 112(H)  CO2 20 - 31 mmol/L 27 26 24   Calcium 8.6 - 10.4 mg/dL 8.7 8.4(L) 8.4(L)   Assessment: Patient is compliant with 2 class blood pressure regimen of Lasix and Metoprolol. She denies any symptoms of lightheadedness or dizziness.  Plan: - continue current regimen, Lasix 20mg  daily and Metoprolol 12.5mg  daily - rtc 1 month for DM follow up

## 2015-06-20 NOTE — Assessment & Plan Note (Addendum)
Assessment: Patient A1c today is 7.1 which is increased from previous readings but is also likely inaccurate based on her frequent blood transfusion requirement.  Her CBG today is 297.  She brought her glucometer to clinic this afternoon and it shows mostly elevated blood sugars with a couple of lows <80.  Particularly, her meter shows some post-prandial spikes greater than 400.  She was previously on Levemir 30units BID but this was decreased in March 2017 due to some lows noted on her meter when she met with Debera Lat.  She used to be on Metformin as well but reports taking herself off this medication.  I spoke with patient regarding restarting Metformin and she was reluctant to do so because she reports it made her lose a lot of weight and she is concerned about her kidney function (last BMET with normal SCr and EGFR > 60).  She is also reluctant to start Novolog with meals to address the post-prandial high glucose because of having to keep that strict of a schedule and having to eat shortly after giving it.  She is agreeable to starting a once daily dose of Glipizide to help with her high sugars.  While not the perfect solution to her glucose control and given her memory loss, I would also be hesitant as well to start her on Novolog TID with meals.  Plan: - continue Levemir 20units BID - continue blood glucose monitoring BID - start glipizide 2.29m PO daily.  Based on response, can increase as appropriate - she is no longer on Ace inhibitor (stopped by cardiology in Oct 2016) due to low BP so will check a urine microalbumin - rtc 1 month for follow up

## 2015-06-21 LAB — MICROALBUMIN / CREATININE URINE RATIO
Creatinine, Urine: 54.7 mg/dL
Microalbumin, Urine: <3 ug/mL

## 2015-06-22 NOTE — Progress Notes (Signed)
I saw and evaluated the patient.  I personally confirmed the key portions of Dr. Alcario Drought history and exam and reviewed pertinent patient test results.  The assessment, diagnosis, and plan were formulated together and I agree with the documentation in the resident's note.  I was present at the bedside for the entire liquid nitrogen application.  She tolerated the treatment well.

## 2015-06-24 ENCOUNTER — Other Ambulatory Visit (INDEPENDENT_AMBULATORY_CARE_PROVIDER_SITE_OTHER): Payer: Medicare Other

## 2015-06-24 DIAGNOSIS — I780 Hereditary hemorrhagic telangiectasia: Secondary | ICD-10-CM | POA: Diagnosis not present

## 2015-06-24 DIAGNOSIS — K31819 Angiodysplasia of stomach and duodenum without bleeding: Secondary | ICD-10-CM

## 2015-06-24 DIAGNOSIS — K31811 Angiodysplasia of stomach and duodenum with bleeding: Secondary | ICD-10-CM

## 2015-06-24 DIAGNOSIS — D509 Iron deficiency anemia, unspecified: Secondary | ICD-10-CM

## 2015-06-24 LAB — CBC WITH DIFFERENTIAL/PLATELET
Basophils Absolute: 0 10*3/uL (ref 0.0–0.1)
Basophils Relative: 0 %
EOS PCT: 7 %
Eosinophils Absolute: 0.4 10*3/uL (ref 0.0–0.7)
HEMATOCRIT: 24.9 % — AB (ref 36.0–46.0)
Hemoglobin: 8.1 g/dL — ABNORMAL LOW (ref 12.0–15.0)
LYMPHS ABS: 0.7 10*3/uL (ref 0.7–4.0)
LYMPHS PCT: 14 %
MCH: 29.2 pg (ref 26.0–34.0)
MCHC: 32.5 g/dL (ref 30.0–36.0)
MCV: 89.9 fL (ref 78.0–100.0)
MONO ABS: 0.5 10*3/uL (ref 0.1–1.0)
MONOS PCT: 9 %
NEUTROS ABS: 3.7 10*3/uL (ref 1.7–7.7)
Neutrophils Relative %: 70 %
PLATELETS: 224 10*3/uL (ref 150–400)
RBC: 2.77 MIL/uL — ABNORMAL LOW (ref 3.87–5.11)
RDW: 13.9 % (ref 11.5–15.5)
WBC: 5.3 10*3/uL (ref 4.0–10.5)

## 2015-06-24 LAB — FERRITIN: Ferritin: 15 ng/mL (ref 11–307)

## 2015-06-24 LAB — TYPE AND SCREEN
ABO/RH(D): A POS
Antibody Screen: NEGATIVE

## 2015-06-25 ENCOUNTER — Telehealth: Payer: Self-pay | Admitting: *Deleted

## 2015-06-25 LAB — RPR: RPR Ser Ql: NONREACTIVE

## 2015-06-25 NOTE — Telephone Encounter (Addendum)
Talked to pt's husband - informed him, [name]'s Hgb is 8.1, no blood transfusion this week per Dr [name] but will need to repeat labs on Monday 5/22. Appt scheduled 5/22 @ 1000 AM. Also talked to [facility] @ Short Stay - scheduled appt for blood transfusion on Tuesday @ 0900 AM. I will call/iinform pt.

## 2015-06-25 NOTE — Telephone Encounter (Signed)
Pt called /informed of appt for blood transfusion at [place].

## 2015-06-26 ENCOUNTER — Other Ambulatory Visit: Payer: Self-pay | Admitting: Oncology

## 2015-06-26 DIAGNOSIS — I780 Hereditary hemorrhagic telangiectasia: Secondary | ICD-10-CM

## 2015-06-26 DIAGNOSIS — K31811 Angiodysplasia of stomach and duodenum with bleeding: Secondary | ICD-10-CM

## 2015-06-26 DIAGNOSIS — D509 Iron deficiency anemia, unspecified: Secondary | ICD-10-CM

## 2015-06-26 DIAGNOSIS — K31819 Angiodysplasia of stomach and duodenum without bleeding: Secondary | ICD-10-CM

## 2015-07-01 ENCOUNTER — Other Ambulatory Visit (HOSPITAL_COMMUNITY): Payer: Self-pay | Admitting: *Deleted

## 2015-07-01 ENCOUNTER — Other Ambulatory Visit (INDEPENDENT_AMBULATORY_CARE_PROVIDER_SITE_OTHER): Payer: Medicare Other

## 2015-07-01 DIAGNOSIS — K31819 Angiodysplasia of stomach and duodenum without bleeding: Secondary | ICD-10-CM

## 2015-07-01 DIAGNOSIS — D509 Iron deficiency anemia, unspecified: Secondary | ICD-10-CM

## 2015-07-01 DIAGNOSIS — K31811 Angiodysplasia of stomach and duodenum with bleeding: Secondary | ICD-10-CM

## 2015-07-01 DIAGNOSIS — I780 Hereditary hemorrhagic telangiectasia: Secondary | ICD-10-CM | POA: Insufficient documentation

## 2015-07-01 LAB — CBC WITH DIFFERENTIAL/PLATELET
Basophils Absolute: 0 10*3/uL (ref 0.0–0.1)
Basophils Relative: 0 %
Eosinophils Absolute: 0.2 10*3/uL (ref 0.0–0.7)
Eosinophils Relative: 4 %
HEMATOCRIT: 29.6 % — AB (ref 36.0–46.0)
HEMOGLOBIN: 9.1 g/dL — AB (ref 12.0–15.0)
LYMPHS ABS: 0.8 10*3/uL (ref 0.7–4.0)
LYMPHS PCT: 12 %
MCH: 27.2 pg (ref 26.0–34.0)
MCHC: 30.7 g/dL (ref 30.0–36.0)
MCV: 88.4 fL (ref 78.0–100.0)
MONOS PCT: 5 %
Monocytes Absolute: 0.3 10*3/uL (ref 0.1–1.0)
NEUTROS ABS: 5 10*3/uL (ref 1.7–7.7)
NEUTROS PCT: 79 %
Platelets: 176 10*3/uL (ref 150–400)
RBC: 3.35 MIL/uL — ABNORMAL LOW (ref 3.87–5.11)
RDW: 14.6 % (ref 11.5–15.5)
WBC: 6.3 10*3/uL (ref 4.0–10.5)

## 2015-07-02 ENCOUNTER — Ambulatory Visit (HOSPITAL_COMMUNITY)
Admission: RE | Admit: 2015-07-02 | Discharge: 2015-07-02 | Disposition: A | Discharge status: Home / Self Care | Payer: Medicare Other | Source: Ambulatory Visit | Attending: Oncology | Admitting: Oncology

## 2015-07-02 VITALS — BP 112/69 | HR 55 | Temp 97.7°F | Resp 20 | Ht 63.0 in | Wt 139.0 lb

## 2015-07-02 DIAGNOSIS — D509 Iron deficiency anemia, unspecified: Secondary | ICD-10-CM

## 2015-07-02 DIAGNOSIS — K31811 Angiodysplasia of stomach and duodenum with bleeding: Secondary | ICD-10-CM | POA: Diagnosis not present

## 2015-07-02 DIAGNOSIS — K31819 Angiodysplasia of stomach and duodenum without bleeding: Secondary | ICD-10-CM

## 2015-07-02 DIAGNOSIS — I780 Hereditary hemorrhagic telangiectasia: Secondary | ICD-10-CM

## 2015-07-02 LAB — PREPARE RBC (CROSSMATCH)

## 2015-07-02 MED ORDER — HEPARIN SOD (PORK) LOCK FLUSH 100 UNIT/ML IV SOLN
500.0000 [IU] | INTRAVENOUS | Status: AC | PRN
  Administered 2015-07-02: 500 [IU]

## 2015-07-02 MED ORDER — SODIUM CHLORIDE 0.9 % IV SOLN
Freq: Once | INTRAVENOUS | Status: AC
  Administered 2015-07-02: 12:00:00 via INTRAVENOUS

## 2015-07-02 MED ORDER — HEPARIN SOD (PORK) LOCK FLUSH 100 UNIT/ML IV SOLN
INTRAVENOUS | Status: AC
  Filled 2015-07-02: qty 5

## 2015-07-03 ENCOUNTER — Other Ambulatory Visit: Payer: Medicare Other

## 2015-07-03 LAB — TYPE AND SCREEN
ABO/RH(D): A POS
Antibody Screen: NEGATIVE
UNIT DIVISION: 0
Unit division: 0

## 2015-07-17 ENCOUNTER — Other Ambulatory Visit (INDEPENDENT_AMBULATORY_CARE_PROVIDER_SITE_OTHER): Payer: Medicare Other

## 2015-07-17 ENCOUNTER — Telehealth: Payer: Self-pay | Admitting: *Deleted

## 2015-07-17 ENCOUNTER — Ambulatory Visit (INDEPENDENT_AMBULATORY_CARE_PROVIDER_SITE_OTHER): Payer: Medicare Other | Admitting: Internal Medicine

## 2015-07-17 ENCOUNTER — Encounter: Payer: Self-pay | Admitting: Internal Medicine

## 2015-07-17 ENCOUNTER — Other Ambulatory Visit: Payer: Self-pay | Admitting: Oncology

## 2015-07-17 VITALS — BP 108/37 | HR 58 | Temp 98.1°F | Wt 140.7 lb

## 2015-07-17 DIAGNOSIS — I780 Hereditary hemorrhagic telangiectasia: Secondary | ICD-10-CM | POA: Diagnosis not present

## 2015-07-17 DIAGNOSIS — D509 Iron deficiency anemia, unspecified: Secondary | ICD-10-CM

## 2015-07-17 DIAGNOSIS — L439 Lichen planus, unspecified: Secondary | ICD-10-CM

## 2015-07-17 DIAGNOSIS — Q2733 Arteriovenous malformation of digestive system vessel: Secondary | ICD-10-CM

## 2015-07-17 DIAGNOSIS — K31811 Angiodysplasia of stomach and duodenum with bleeding: Secondary | ICD-10-CM

## 2015-07-17 DIAGNOSIS — K31819 Angiodysplasia of stomach and duodenum without bleeding: Secondary | ICD-10-CM

## 2015-07-17 LAB — FERRITIN: Ferritin: 10 ng/mL — ABNORMAL LOW (ref 11–307)

## 2015-07-17 LAB — SAMPLE TO BLOOD BANK

## 2015-07-17 LAB — CBC WITH DIFFERENTIAL/PLATELET
Basophils Absolute: 0 10*3/uL (ref 0.0–0.1)
Basophils Relative: 0 %
EOS PCT: 4 %
Eosinophils Absolute: 0.3 10*3/uL (ref 0.0–0.7)
HCT: 29.8 % — ABNORMAL LOW (ref 36.0–46.0)
Hemoglobin: 9.3 g/dL — ABNORMAL LOW (ref 12.0–15.0)
LYMPHS ABS: 0.8 10*3/uL (ref 0.7–4.0)
LYMPHS PCT: 11 %
MCH: 27.9 pg (ref 26.0–34.0)
MCHC: 31.2 g/dL (ref 30.0–36.0)
MCV: 89.5 fL (ref 78.0–100.0)
MONO ABS: 0.6 10*3/uL (ref 0.1–1.0)
MONOS PCT: 9 %
Neutro Abs: 5.3 10*3/uL (ref 1.7–7.7)
Neutrophils Relative %: 76 %
PLATELETS: 225 10*3/uL (ref 150–400)
RBC: 3.33 MIL/uL — AB (ref 3.87–5.11)
RDW: 14.2 % (ref 11.5–15.5)
WBC: 7 10*3/uL (ref 4.0–10.5)

## 2015-07-17 NOTE — Telephone Encounter (Signed)
-----   Message from [name], MD sent at 07/17/2015 11:13 AM EDT ----- Call pt: no blood transfusion this week but I would like to give her some iron - please schedule with short stay

## 2015-07-17 NOTE — Telephone Encounter (Signed)
Pt called - talked to her husband; no blood transfusion this week but would give iron per Dr [name]. Hgb 9.3. Scheduled appt at Short Stay - Friday 6/9 @1000  AM; husband  awared.

## 2015-07-17 NOTE — Patient Instructions (Signed)
Will refer you to derm.   F/up with as as needed.

## 2015-07-17 NOTE — Assessment & Plan Note (Addendum)
Skin rash appears to be lichen planus lesions. Does state she had biopsy which showed lichen planus.  -Will refer to derm for the management of this.  I decided to defer starting steroid as she will see derm soon.

## 2015-07-17 NOTE — Progress Notes (Signed)
   Subjective:    Patient ID: [name], female    DOB: 1941/01/06, 75 y.o.   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 Comes in for acute complaint of rash.  Rash is on her both legs, covers entire leg and thigh but spares her vaginal area. This has been there for at least 10 years. Seen derm in the past, did skin bx and states it was "lichen planus". Got some "cream" many years ago and states it did not help. Did not follow up with derm since at least 10 years. No acute changes of this chronic rash. No fever/chills or other symptoms.    Review of Systems  Constitutional: Negative for fever and chills.  HENT: Negative for congestion and sore throat.   Respiratory: Negative for choking and shortness of breath.   Genitourinary: Negative for dysuria.  Skin: Positive for rash.  Neurological: Negative for dizziness and numbness.       Objective:   Physical Exam  Constitutional: She is oriented to person, place, and time. She appears well-developed and well-nourished.  Eyes: Conjunctivae are normal.  Musculoskeletal: Normal range of motion. She exhibits no edema or tenderness.  Neurological: She is alert and oriented to person, place, and time. No cranial nerve deficit. Coordination normal.  Skin:  Has rash on both legs from thigh down to he entire leg. It's slightly raised, annular, hypopigmented areas, non pruritic. No drainage. Sparing the genital areas.     Filed Vitals:   07/17/15 1026  BP: 108/37  Pulse: 58  Temp: 98.1 F (36.7 C)           Assessment & Plan:  See problem based a&p.

## 2015-07-18 ENCOUNTER — Encounter: Payer: Self-pay | Admitting: Dietician

## 2015-07-18 ENCOUNTER — Other Ambulatory Visit (HOSPITAL_COMMUNITY): Payer: Self-pay | Admitting: *Deleted

## 2015-07-18 ENCOUNTER — Telehealth: Payer: Self-pay | Admitting: Licensed Clinical Social Worker

## 2015-07-18 ENCOUNTER — Other Ambulatory Visit: Payer: Self-pay | Admitting: Dietician

## 2015-07-18 ENCOUNTER — Ambulatory Visit (INDEPENDENT_AMBULATORY_CARE_PROVIDER_SITE_OTHER): Payer: Medicare Other | Admitting: Dietician

## 2015-07-18 VITALS — Ht 63.0 in | Wt 140.9 lb

## 2015-07-18 DIAGNOSIS — IMO0002 Reserved for concepts with insufficient information to code with codable children: Secondary | ICD-10-CM

## 2015-07-18 DIAGNOSIS — E1165 Type 2 diabetes mellitus with hyperglycemia: Principal | ICD-10-CM

## 2015-07-18 DIAGNOSIS — E119 Type 2 diabetes mellitus without complications: Secondary | ICD-10-CM

## 2015-07-18 DIAGNOSIS — Z794 Long term (current) use of insulin: Secondary | ICD-10-CM

## 2015-07-18 DIAGNOSIS — E1129 Type 2 diabetes mellitus with other diabetic kidney complication: Secondary | ICD-10-CM

## 2015-07-18 DIAGNOSIS — Z713 Dietary counseling and surveillance: Secondary | ICD-10-CM

## 2015-07-18 MED ORDER — INSULIN SYRINGE-NEEDLE U-100 31G X 15/64" 0.3 ML MISC
Status: DC
Start: 2015-07-18 — End: 2015-07-25

## 2015-07-18 NOTE — Telephone Encounter (Signed)
She is using large syringes making drawing up and administration more difficult. She needs an active prescription for Medicare reimbursement.

## 2015-07-18 NOTE — Progress Notes (Signed)
  Medical Nutrition Therapy:  Appt start time: 0935 end time:  1039. Visit # 3  Assessment:  Primary concerns today: patient has no concerns [name] was referred for better eating habits, weight stabilization/loss. She says she is not cooking much anymore. She lost her meter so has been using her husbands. She says she writes her blood sugars in a book, but does not have the book with her today. She drew up 24 units of insulin when asked to draw up 20 units.  She does not remember what her blood sugar was this morning and her husband didn't know either.  He seems to defer to her. Her weight is stable.  She has been drinking ~ 1 Boost/Glucerna each day. She reports her bowels are fairly regular.    ANTHROPOMETRICS: weight-140.9#, stable and appropriate, BMI -  Now 24-25 MEDICINE: does not know if she is taking the glipizide, reports she takes 20 units of insulin twice daily, using the syringes that she uses at home( 1cc) she drew up 24 units instead of 20.  Calculated TDD is 32, actual TDD is 40,  BLOOD SUGAR: A1C goal < 7.5%, she has anemia, so her A1C may not be an accurate reflection of her glucose control. CBG 135 on her meter today in the office after reportedly eating 2 packs apples cinnamon oatmeal made with milk, coffee and glucerna.  Meter download: overall her blood sugars seem improved wit less high blood sugars but since she is using another meter it is difficult to determine. she had 4 low blood sugarss in the am, 2 below desired range, average is 185.  neither her husband nor she remembered them.  DIETARY INTAKE: Usual eating pattern now 3 meals and 1-2 snack daily.   24-hr recall :  B ( 10 AM): 2 packs sweetened oatmeal (made with milk),Glucerna,  small cup coffee    L ( PM): sandwich- tomato, bologna or peanut butter or cheese, salad with ranch dressing,  ~ 12 oz juice D ( PM): says she is mostly sleeping during dinner, if she is awake she eats a sandwich Snacks: peanut butter  sandwich, fruit- when she has it, none in the house now, grahams. Cookie. Sometimes ice cream Beverages: mostly water, ~has cut down on juice to once in a while, , coffee  Usual physical activity: reads most of day, does not feel safe to walk by herself but would like to walk  Progress Towards Goal(s):  Some progress.   Nutritional Diagnosis:  NI-1.4 Inadequate energy intake As related to lack of assistance, memory problems, lack of support, competing values and possibly finances improved and stable  As evidenced by her report and weight stabilization for past 3-6 months.     Intervention:  Nutrition education about how to maximize nutrient dense foods, how to use an insulin pen Goals- have met goals of weight maintenance and adequate nutrition. Her blood sugars are not stable and safe.  Coordination of care: She is not safe to independently administer insulin, insulin pens may help. Improving her blood sugars will ultimately improve her nutritional status.   Teaching Method Utilized: Visual, Auditory,  Hands on Handouts given during visit include:AVS, signs and symptoms of low blood sugar and how to treat it Barriers to learning/adherence to lifestyle change: competing values- memory problems, both husband and wife seems to need caretakers Demonstrated degree of understanding via:  Teach Back   Monitoring/Evaluation:  Dietary intake, exercise, meter, and body weight in 10 week(s).

## 2015-07-18 NOTE — Progress Notes (Signed)
Internal Medicine Clinic Attending  Case discussed with Dr. Ahmed at the time of the visit.  We reviewed the resident's history and exam and pertinent patient test results.  I agree with the assessment, diagnosis, and plan of care documented in the resident's note. 

## 2015-07-18 NOTE — Patient Instructions (Signed)
Keep trying to eat fruit at least one time a day.  Eat tuna salad, chicken salad or egg salad for an easy to chew high protein food.  Bring your diabetes log book to your next appointment.  Talk to your husband about using the insulin pen instead of vials and syringes.

## 2015-07-18 NOTE — Telephone Encounter (Signed)
Ms. Casher was referred to CSW by [facility] RD.  During pt's 07/18/15 appointment with RD, pt voiced difficulty managing her ADL's independently.  CSW placed call to Ms. Oglesby and discussed the Medicaid benefit of PCS.  CSW inquired if pt would be open to being assessed for personal care services.  Pt agreed and notified request would be forwarded to PCP for review.  If PCP in agreement, pt aware she will be contacted by [facility] for interview.

## 2015-07-19 ENCOUNTER — Ambulatory Visit (HOSPITAL_COMMUNITY)
Admission: RE | Admit: 2015-07-19 | Discharge: 2015-07-19 | Disposition: A | Discharge status: Home / Self Care | Payer: Medicare Other | Source: Ambulatory Visit | Attending: Oncology | Admitting: Oncology

## 2015-07-19 VITALS — BP 101/31 | HR 54 | Temp 98.0°F | Resp 20 | Ht 63.0 in | Wt 140.0 lb

## 2015-07-19 DIAGNOSIS — I780 Hereditary hemorrhagic telangiectasia: Secondary | ICD-10-CM | POA: Diagnosis present

## 2015-07-19 DIAGNOSIS — D509 Iron deficiency anemia, unspecified: Secondary | ICD-10-CM | POA: Insufficient documentation

## 2015-07-19 DIAGNOSIS — K31811 Angiodysplasia of stomach and duodenum with bleeding: Secondary | ICD-10-CM

## 2015-07-19 MED ORDER — SODIUM CHLORIDE 0.9 % IV SOLN
510.0000 mg | Freq: Once | INTRAVENOUS | Status: AC
  Administered 2015-07-19: 510 mg via INTRAVENOUS
  Filled 2015-07-19: qty 17

## 2015-07-25 ENCOUNTER — Ambulatory Visit (INDEPENDENT_AMBULATORY_CARE_PROVIDER_SITE_OTHER): Payer: Medicare Other | Admitting: Internal Medicine

## 2015-07-25 VITALS — BP 114/48 | HR 100 | Temp 97.9°F | Wt 139.6 lb

## 2015-07-25 DIAGNOSIS — Z794 Long term (current) use of insulin: Secondary | ICD-10-CM

## 2015-07-25 DIAGNOSIS — E1129 Type 2 diabetes mellitus with other diabetic kidney complication: Secondary | ICD-10-CM

## 2015-07-25 DIAGNOSIS — Z0000 Encounter for general adult medical examination without abnormal findings: Secondary | ICD-10-CM

## 2015-07-25 DIAGNOSIS — E1165 Type 2 diabetes mellitus with hyperglycemia: Secondary | ICD-10-CM

## 2015-07-25 DIAGNOSIS — IMO0002 Reserved for concepts with insufficient information to code with codable children: Secondary | ICD-10-CM

## 2015-07-25 MED ORDER — INSULIN PEN NEEDLE 31G X 5 MM MISC: Status: DC

## 2015-07-25 MED ORDER — INSULIN DETEMIR 100 UNIT/ML FLEXPEN: 20.0000 [IU] | PEN_INJECTOR | Freq: Two times a day (BID) | SUBCUTANEOUS | Status: DC

## 2015-07-25 NOTE — Assessment & Plan Note (Signed)
Assessment: Patient recently seen by diabetes educator and showed some difficulty with drawing up appropriate amount of Levemir.  Did not bring a meter with her today but reports checking her glucose BID.  At her visit last week with Ms. Butch Penny Plyler, her average reading was 185.  She reports taking Glipizide but is unsure if she is taking 2.5 or 5mg  as she is prescribed 2.5mg .  She has not had any symptoms of hypoglycemia.  Plan: - as her A1c is likely unreliable in the setting of her frequent blood transfusions, we are using her glucose readings to hopefully more accurately manage her diabetes. - continue glipizide 2.5mg  daily - change her to Levemir Flexpen 20units BID.  This will hopefully make it easier for her to administer the correct dose of insulin and avoid potential side effects of administering too much at one time

## 2015-07-25 NOTE — Patient Instructions (Signed)
Thank you for coming to see me today. It was a pleasure. Today we talked about:   Your diabetes: continue taking glipizide 2.5 mg daily.  We have switched you to Levemir pens so that it is easier for you to administer your insulin.  Continue taking 20mg  twice daily of Levemir  Please follow-up with me in 6 months.  If you have any questions or concerns, please do not hesitate to call the office at [phone].  Take Care,   [name], DO

## 2015-07-25 NOTE — Assessment & Plan Note (Signed)
Assessment/Plan: - patient has DEXA ordered but still needs to be done - patient declined pneumonia vaccine today

## 2015-07-25 NOTE — Progress Notes (Signed)
Patient ID: [name], female   DOB: [birth year], 75 y.o.   MRN: [id number]   Subjective:   Patient ID: [name] female   DOB: March 03, 1940 75 y.o.   MRN: [id number]  HPI: [name] is a 75 y.o. woman with PMHx who presents today for follow up of her DM.  Please see A&P for status of patients medical conditions addressed today.    Past Medical History  Diagnosis Date  . History of epistaxis   . Hyperlipidemia   . Telangiectasia     Gastric   . Lichen planus     Both lower extremities  . GERD (gastroesophageal reflux disease)   . Heart murmur   . CHF (congestive heart failure) ([place])   . History of blood transfusion "several"  . Chronic diastolic CHF (congestive heart failure) ([place]) 10/03/2013  . HTN (hypertension), benign 03/02/2012  . Pneumonia 1990's X 2  . Family history of anesthesia complication     "niece has a hard time coming out" (09/15/2012)  . Type II diabetes mellitus ([name]     insulin requiring.  . Seizures ([place]) 09/2014  . Frequent nosebleeds     chronic  . Chronic GI bleeding     [name] 11/29/2014  . Iron deficiency anemia     chronic infusions"  . Chronic anemia   . 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 Gastric AV malformation     /notes 11/29/2014   Current Outpatient Prescriptions  Medication Sig Dispense Refill  . AMITIZA 24 MCG capsule Take 24 mcg by mouth 2 (two) times daily.    . Blood Glucose Monitoring Suppl (ONETOUCH VERIO) W/DEVICE KIT 1 each by Does not apply route 3 (three) times daily. 1 kit 1  . donepezil (ARICEPT) 10 MG tablet Take 1 tablet (10 mg total) by mouth at bedtime. 30 tablet 11  . ferrous fumarate (HEMOCYTE - 106 MG FE) 325 (106 FE) MG TABS tablet Take 1 tablet by mouth daily. Reported on 01/30/2015    . furosemide (LASIX) 20 MG tablet Take 1 tablet (20 mg total) by mouth daily. 30 tablet 11  . glipiZIDE (GLUCOTROL) 5 MG tablet Take 0.5 tablets (2.5 mg total) by mouth daily before breakfast. 30 tablet  2  . glucose blood (ONETOUCH VERIO) test strip Check blood sugar three times a day 100 each 5  . Insulin Detemir (LEVEMIR FLEXTOUCH) 100 UNIT/ML Pen Inject 20 Units into the skin 2 (two) times daily. 15 mL 11  . Insulin Pen Needle (B-D UF III MINI PEN NEEDLES) 31G X 5 MM MISC Use to inject insulin 2 times per day. 100 each 5  . levETIRAcetam (KEPPRA) 500 MG tablet Take 1 tablet (500 mg total) by mouth 2 (two) times daily. 60 tablet 11  . metoprolol succinate (TOPROL XL) 25 MG 24 hr tablet Take 0.5 tablets (12.5 mg total) by mouth daily. 30 tablet 11  . ONETOUCH DELICA LANCETS FINE MISC Check blood sugar three times a day 100 each 5  . oxymetazoline (AFRIN) 0.05 % nasal spray Place 1 spray into both nostrils 2 (two) times daily as needed (Epistaxis). (Patient not taking: Reported on 01/30/2015) 30 mL 0  . pantoprazole (PROTONIX) 40 MG tablet Take 1 tablet (40 mg total) by mouth 2 (two) times daily. (Patient taking differently: Take 40 mg by mouth daily. ) 60 tablet 1  . potassium chloride SA (K-DUR,KLOR-CON) 20 MEQ tablet Take 1 tablet (20 mEq total) by mouth daily.  30 tablet 11  . traMADol (ULTRAM) 50 MG tablet Take 50 mg by mouth as needed.     No current facility-administered medications for this visit.   Facility-Administered Medications Ordered in Other Visits  Medication Dose Route Frequency Provider Last Rate Last Dose  . 0.9 %  sodium chloride infusion  250 mL Intravenous Once [name], MD   250 mL at 02/06/15 1325   Family History  Problem Relation Age of Onset  . Breast cancer    . Malignant hyperthermia Neg Hx   . Seizures Neg Hx   . Stroke Father    Social History   Social History  . Marital Status: Married    Spouse Name: [name]  . Number of Children: 0  . Years of Education: 9   Occupational History  . Retired Engineer, manufacturing systems    Social History Main Topics  . Smoking status: Former Smoker -- 1.00 packs/day for 20 years    Types: Cigarettes    Quit date:  02/10/1971  . Smokeless tobacco: Never Used     Comment: 09/15/2012 "smoked 50-60 yr ago"  . Alcohol Use: No  . Drug Use: No  . Sexual Activity: Not on file   Other Topics Concern  . Not on file   Social History Narrative   Married, lives with husband, [name].  Ambulates without assistance.     Caffeine use: none   Review of Systems: Review of Systems  Constitutional: Negative for fever and chills.  Respiratory: Negative for cough and shortness of breath.   Cardiovascular: Negative for chest pain and leg swelling.  Genitourinary: Negative for dysuria.  Skin: Positive for rash. Negative for itching.    Objective:  Physical Exam: Filed Vitals:   07/25/15 1548  BP: 114/48  Pulse: 100  Temp: 97.9 F (36.6 C)  TempSrc: Oral  Weight: 139 lb 9.6 oz (63.322 kg)  SpO2: 100%   Physical Exam  Constitutional: She is well-developed, well-nourished, and in no distress.  HENT:  Head: Normocephalic and atraumatic.  Eyes: EOM are normal.  Neck: Normal range of motion.  Pulmonary/Chest: Effort normal.  Skin:  Hypopigmented rash on both legs relatively unchanged from previous.  Non-pruritic, non-draining.  Has follow up scheduled with Derm on July 11.    Assessment & Plan:   Case discussed with Dr. Evette Doffing.  DM (diabetes mellitus), type 2, uncontrolled, with renal complications ([name] Assessment: Patient recently seen by diabetes educator and showed some difficulty with drawing up appropriate amount of Levemir.  Did not bring a meter with her today but reports checking her glucose BID.  At her visit last week with Ms. Butch Penny Plyler, her average reading was 185.  She reports taking Glipizide but is unsure if she is taking 2.5 or 38m as she is prescribed 2.5842m  She has not had any symptoms of hypoglycemia.  Plan: - as her A1c is likely unreliable in the setting of her frequent blood transfusions, we are using her glucose readings to hopefully more accurately manage her diabetes. -  continue glipizide 2.42m13maily - change her to Levemir Flexpen 20units BID.  This will hopefully make it easier for her to administer the correct dose of insulin and avoid potential side effects of administering too much at one time  Healthcare maintenance Assessment/Plan: - patient has DEXA ordered but still needs to be done - patient declined pneumonia vaccine today

## 2015-07-26 NOTE — Progress Notes (Signed)
Internal Medicine Clinic Attending  Case discussed with Dr. Wallace at the time of the visit.  We reviewed the resident's history and exam and pertinent patient test results.  I agree with the assessment, diagnosis, and plan of care documented in the resident's note.  

## 2015-07-31 ENCOUNTER — Ambulatory Visit: Payer: Medicare Other | Admitting: Adult Health

## 2015-07-31 ENCOUNTER — Other Ambulatory Visit (INDEPENDENT_AMBULATORY_CARE_PROVIDER_SITE_OTHER): Payer: Medicare Other

## 2015-07-31 DIAGNOSIS — I780 Hereditary hemorrhagic telangiectasia: Secondary | ICD-10-CM | POA: Diagnosis not present

## 2015-07-31 DIAGNOSIS — Q2733 Arteriovenous malformation of digestive system vessel: Secondary | ICD-10-CM

## 2015-07-31 DIAGNOSIS — D509 Iron deficiency anemia, unspecified: Secondary | ICD-10-CM | POA: Diagnosis not present

## 2015-07-31 DIAGNOSIS — K31819 Angiodysplasia of stomach and duodenum without bleeding: Secondary | ICD-10-CM

## 2015-07-31 DIAGNOSIS — K31811 Angiodysplasia of stomach and duodenum with bleeding: Secondary | ICD-10-CM

## 2015-07-31 LAB — CBC WITH DIFFERENTIAL/PLATELET
BASOS PCT: 0 %
Basophils Absolute: 0 10*3/uL (ref 0.0–0.1)
EOS ABS: 0.3 10*3/uL (ref 0.0–0.7)
EOS PCT: 7 %
HCT: 26.1 % — ABNORMAL LOW (ref 36.0–46.0)
HEMOGLOBIN: 9 g/dL — AB (ref 12.0–15.0)
Lymphocytes Relative: 13 %
Lymphs Abs: 0.6 10*3/uL — ABNORMAL LOW (ref 0.7–4.0)
MCH: 31.7 pg (ref 26.0–34.0)
MCHC: 34.5 g/dL (ref 30.0–36.0)
MCV: 91.9 fL (ref 78.0–100.0)
MONOS PCT: 9 %
Monocytes Absolute: 0.4 10*3/uL (ref 0.1–1.0)
NEUTROS PCT: 71 %
Neutro Abs: 3.1 10*3/uL (ref 1.7–7.7)
PLATELETS: 189 10*3/uL (ref 150–400)
RBC: 2.84 MIL/uL — ABNORMAL LOW (ref 3.87–5.11)
RDW: 16.2 % — ABNORMAL HIGH (ref 11.5–15.5)
WBC: 4.5 10*3/uL (ref 4.0–10.5)

## 2015-07-31 LAB — FERRITIN: FERRITIN: 103 ng/mL (ref 11–307)

## 2015-07-31 LAB — SAMPLE TO BLOOD BANK

## 2015-08-01 ENCOUNTER — Telehealth: Payer: Self-pay | Admitting: *Deleted

## 2015-08-01 NOTE — Telephone Encounter (Signed)
-----   Message from [name], MD sent at 07/31/2015  1:20 PM EDT ----- Holley Raring- call pt: blood count OK  No need for transfusion this week

## 2015-08-01 NOTE — Telephone Encounter (Signed)
Pt called / informed blood count Ok ( Hgb 9.0 and Ferritin 103) and no need for transfusion this week per Dr [name]. Pt voiced understanding.

## 2015-08-05 ENCOUNTER — Encounter: Payer: Self-pay | Admitting: Oncology

## 2015-08-05 ENCOUNTER — Ambulatory Visit (INDEPENDENT_AMBULATORY_CARE_PROVIDER_SITE_OTHER): Payer: Medicare Other | Admitting: Oncology

## 2015-08-05 VITALS — BP 117/43 | HR 51 | Temp 97.5°F | Wt 147.4 lb

## 2015-08-05 DIAGNOSIS — Q2733 Arteriovenous malformation of digestive system vessel: Secondary | ICD-10-CM | POA: Diagnosis not present

## 2015-08-05 DIAGNOSIS — D62 Acute posthemorrhagic anemia: Secondary | ICD-10-CM

## 2015-08-05 DIAGNOSIS — D649 Anemia, unspecified: Secondary | ICD-10-CM

## 2015-08-05 DIAGNOSIS — I780 Hereditary hemorrhagic telangiectasia: Secondary | ICD-10-CM | POA: Diagnosis not present

## 2015-08-05 DIAGNOSIS — K31819 Angiodysplasia of stomach and duodenum without bleeding: Secondary | ICD-10-CM

## 2015-08-05 NOTE — Patient Instructions (Signed)
Continue every 2 week lab with type & screen in case transfusion needed; ferritin iron storage protein: check every 2 months MD visit 6 months

## 2015-08-05 NOTE — Progress Notes (Signed)
Patient ID: [name], female   DOB: 1940/07/31, 75 y.o.   MRN: [id number] Hematology and Oncology Follow Up Visit  [place] [id number] 1940-03-11 75 y.o. 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 She also has intermittent epistaxis. She requires frequent transfusions and parenteral iron supplementation. She does not tolerate oral iron. Transfusion requirements are becoming more frequent.  She last required hospital admission  for urgent blood transfusion on 11/29/14. She underwent EGD and a laser ablation procedure of a AVM in the stomach at that time. She had a previous evaluation by gastroenterology and underwent upper endoscopy on October 04, 2013 by Dr. Laurence Spates. A number of her gastric AVMs were actively bleeding. Some were cauterized. The largest was clipped.  Despite laser fulguration and clipping of gastric AVMs, she continues to lose blood on a continuous basis.  She continues to be transfusion dependent. I alternate parenteral iron with blood to try to keep her stable. It is an ongoing challenge.She had an episode of hematemesis and was admitted on Jul 05, 2014. A number of AVMs and previous scars from laser procedures were seen but no active bleeding at time of upper endoscopy on May 27. She had a another procedure on September 05, 2014. A large AVM along the greater curvature of the stomach was bleeding and required laser therapy.  Interim History:  She has not noted any gross hematochezia or other sites of bleeding since the October 2016 admission. She continues to require blood transfusions and intermittent IV iron infusions on a regular basis. Last transfusion on May 23, last IV iron on June 9. Ferritin up to 103. No other interim medical  problems. Her diabetes, hyperlipidemia and hypertension currently managed by the internal medicine teaching service.  Medications: reviewed  Allergies:  Allergies  Allergen Reactions  . Aspirin Nausea And Vomiting    Review of Systems: :See interim history Remaining ROS negative:   Physical Exam: Blood pressure 117/43, pulse 51, temperature 97.5 F (36.4 C), temperature source Oral, weight 147 lb 6.4 oz (66.86 kg), SpO2 100 %. Wt Readings from Last 3 Encounters:  08/05/15 147 lb 6.4 oz (66.86 kg)  07/25/15 139 lb 9.6 oz (63.322 kg)  07/19/15 140 lb (63.504 kg)     General appearance: Well-nourished African-American woman wearing a weighted HENNT: Pharynx no erythema, exudate, mass, or ulcer. No thyromegaly or thyroid nodules Lymph nodes: No cervical, supraclavicular, or axillary lymphadenopathy Breasts:  Lungs: Clear to auscultation, resonant to percussion throughout Heart: Regular rhythm, no murmur appreciated on my exam today, no gallop, no rub, no click, no edema Abdomen: Soft, nontender, normal bowel sounds, no mass, no organomegaly Extremities: No edema, no calf tenderness Musculoskeletal: no joint deformities GU:  Vascular: Carotid pulses 2+, faint left carotid bruit,  Neurologic: Alert, oriented, PERRLA, cranial nerves grossly normal, motor strength 5 over 5, reflexes 1+ symmetric, upper body coordination normal, gait not tested Skin: Chronic dermatitis skin lower extremities. No ecchymosis  Lab Results: CBC W/Diff    Component Value Date/Time   WBC 4.5 07/31/2015 0947   WBC 5.2 03/13/2015 1059   WBC 5.5 11/09/2014 1547   RBC 2.84* 07/31/2015 0947   RBC 3.45* 03/13/2015 1059   RBC 2.59* 11/09/2014 1547   RBC 3.19* 02/01/2011 0839   HGB 9.0* 07/31/2015 [id number]  HGB 9.7* 03/13/2015 1059   HCT 26.1* 07/31/2015 0947   HCT 29.5* 03/13/2015 1059   HCT 23.2* 11/09/2014 1547   PLT 189 07/31/2015 0947   PLT 140* 03/13/2015 1059   PLT 237 11/09/2014 1547   MCV 91.9  07/31/2015 0947   MCV 85.5 03/13/2015 1059   MCV 90 11/09/2014 1547   MCH 31.7 07/31/2015 0947   MCH 28.1 03/13/2015 1059   MCH 28.2 11/09/2014 1547   MCHC 34.5 07/31/2015 0947   MCHC 32.9 03/13/2015 1059   MCHC 31.5 11/09/2014 1547   RDW 16.2* 07/31/2015 0947   RDW 21.7* 03/13/2015 1059   RDW 17.8* 11/09/2014 1547   LYMPHSABS 0.6* 07/31/2015 0947   LYMPHSABS 0.4* 03/13/2015 1059   MONOABS 0.4 07/31/2015 0947   MONOABS 0.3 03/13/2015 1059   EOSABS 0.3 07/31/2015 0947   EOSABS 0.3 03/13/2015 1059   BASOSABS 0.0 07/31/2015 0947   BASOSABS 0.0 03/13/2015 1059     Chemistry      Component Value Date/Time   NA 140 01/28/2015 0811   NA 144 04/04/2014 1019   K 3.5 01/28/2015 0811   K 4.5 04/04/2014 1019   CL 107 01/28/2015 0811   CL 111* 12/28/2011 0825   CO2 27 01/28/2015 0811   CO2 26 04/04/2014 1019   BUN 16 01/28/2015 0811   BUN 16.9 04/04/2014 1019   CREATININE 0.92 01/28/2015 0811   CREATININE 0.80 11/30/2014 0537   CREATININE 0.9 04/04/2014 1019      Component Value Date/Time   CALCIUM 8.7 01/28/2015 0811   CALCIUM 9.3 04/04/2014 1019   ALKPHOS 104 11/29/2014 0449   ALKPHOS 140 04/04/2014 1019   AST 22 11/29/2014 0449   AST 30 04/04/2014 1019   ALT 15 11/29/2014 0449   ALT 25 04/04/2014 1019   BILITOT 0.4 11/29/2014 0449   BILITOT 0.43 04/04/2014 1019       Radiological Studies: No results found.  Impression:  #1. HHT #2. Chronic GI blood loss secondary to #1 Continue to monitor on an every two-week basis. We try to give the blood at the [facility] as an outpatient whenever possible. #3. Extensive lichen planus rash lower extremities #4. Aortic stenosis #5. Grade 2 chronic diastolic heart failure with severe asymmetric septal hypertrophy most recent echocardiogram 07/06/2014 #6. Essential hypertension. #7. Type 2 diabetes now insulin-dependent using Levemir #8. Faint carotid bruit left side. #9. Question seizure disorder recently started on  Keppra September 2016 #10. Memory loss. Recently started on Aricept by neurology December 2016  CC: Patient Care Team: [name], DO as PCP - General (Internal Medicine)   [name], MD 6/26/201711:11 AM

## 2015-08-07 ENCOUNTER — Ambulatory Visit (INDEPENDENT_AMBULATORY_CARE_PROVIDER_SITE_OTHER): Payer: Medicare Other | Admitting: Adult Health

## 2015-08-07 ENCOUNTER — Encounter: Payer: Self-pay | Admitting: Adult Health

## 2015-08-07 VITALS — BP 108/45 | HR 51 | Ht 63.0 in | Wt 146.4 lb

## 2015-08-07 DIAGNOSIS — R569 Unspecified convulsions: Secondary | ICD-10-CM

## 2015-08-07 DIAGNOSIS — R413 Other amnesia: Secondary | ICD-10-CM | POA: Diagnosis not present

## 2015-08-07 MED ORDER — MEMANTINE HCL 5 MG PO TABS: ORAL_TABLET | ORAL | Status: DC

## 2015-08-07 MED ORDER — DONEPEZIL HCL 10 MG PO TABS: 10.0000 mg | ORAL_TABLET | Freq: Every day | ORAL | Status: DC

## 2015-08-07 NOTE — Progress Notes (Addendum)
PATIENT: [name] DOB: Mar 15, 1940  REASON FOR VISIT: follow up-memory, seizures HISTORY FROM: patient  HISTORY OF PRESENT ILLNESS: [name] is a 75 year old female with a history of memory disturbance. She returns today for follow-up. She states that her memory has remained stable. She is tolerating Aricept well. She lives at home with her husband. She states that Keppra does make her sleepy throughout the day. She is not had any seizure events. She does not operate a motor vehicle. She does prepare meals without difficulty. She states that she is trying to get Meals on Wheels but is on the wait list. She returns today for an evaluation.  01/30/15 [facility]): She is more forgetful. She forgets things she says, by the time she tella her husband something she has forgotten. They live independently. Pays all the bills, not skipping bills. No accidents in the home. She is losing things. Misplacing. Forgets appointments. Difficulty with organization more. Can't get herself together. She sleeps a lot. No dementia, no alzheimers in the family.   HPI: [name] is a 75 y.o. female here as a referral from Dr. Jeanie Cooks for seizure in the setting of hyperammonemia. She has a past medical history of hypertension, congestive heart failure, diabetes, acute encephalopathy with status epilepticus, acute renal failure superimposed on stage III chronic kidney disease, urinary tract infections, hyperammonemia.  Patient here with husband. She was admitted with an episode of seizure-like activity. Husband provides assist of the information. He describes the incident. She was shaking her hands and head, trying to catch her breath. All information provided by husband. Lasted a few minutes. She was not responsive. It was on a Tuesday. Then she slept for 5 hours afterwards. Then she vomited while sleeping and then they called 911 and went to the emergency room. This is not the first time it has happened. It has  happened in the past. Husband had seen the jerking of the limbs and then she would sleep a while and wake up. He says he has seen in twice in the last year. No family history of seizures. She is much better now. Therapy is coming to her house. Not having any side effects from the medication. She has had episodes of AMS and confusion in the past.   Reviewed notes, labs and imaging from outside physicians, which showed:  MRI of the brain 08/2014 personally reviewed images and agree with following report. : 1. No acute infarct identified. 2. Increased basal ganglia intrinsic T1 signal which can be seen with hepatic insufficiency, cirrhosis, portosystemic shunt, portal vein thrombosis, total parenteral nutrition, and is less often seen with hyperglycemia or other metabolic disturbances such as Wilson's disease. Correlation with serum ammonia levels and liver function tests is recommended. 3. Advanced but nonspecific cerebral white matter signal changes. Most commonly this is due to chronic small vessel disease. Other considerations include hypercoagulable state, vasculitis, , prior infection or demyelination.  CBC showed anemia, anemia while hospitalized 327. Hepatic function was normal. One month previous she had a GI bleed. Lactulose was started for elevated ammonia. An EEG was ordered which was concerning for frontal status epilepticus. Hyperammonemia was thought to be the driver of the seizures however she was started on Keppra. She was discharged with the diagnosis of acute encephalopathy and hyperammonemia, she was discharged on Keppra 500 mg twice daily.  Patient presented to the emergency room in late August with altered mental status and lethargy. CT of the head imaging was unremarkable patient was  afebrile, no leukocytosis, ammonia was 327, BUN was 32 and creatinine was 1.38.  REVIEW OF SYSTEMS: Out of a complete 14 system review of symptoms, the patient complains only of the following symptoms,  and all other reviewed systems are negative.  See history of present illness  ALLERGIES: Allergies  Allergen Reactions  . Aspirin Nausea And Vomiting    HOME MEDICATIONS: Outpatient Prescriptions Prior to Visit  Medication Sig Dispense Refill  . AMITIZA 24 MCG capsule Take 24 mcg by mouth 2 (two) times daily.    . Blood Glucose Monitoring Suppl (ONETOUCH VERIO) W/DEVICE KIT 1 each by Does not apply route 3 (three) times daily. 1 kit 1  . donepezil (ARICEPT) 10 MG tablet Take 1 tablet (10 mg total) by mouth at bedtime. 30 tablet 11  . ferrous fumarate (HEMOCYTE - 106 MG FE) 325 (106 FE) MG TABS tablet Take 1 tablet by mouth daily. Reported on 01/30/2015    . furosemide (LASIX) 20 MG tablet Take 1 tablet (20 mg total) by mouth daily. 30 tablet 11  . glipiZIDE (GLUCOTROL) 5 MG tablet Take 0.5 tablets (2.5 mg total) by mouth daily before breakfast. 30 tablet 2  . glucose blood (ONETOUCH VERIO) test strip Check blood sugar three times a day 100 each 5  . Insulin Detemir (LEVEMIR FLEXTOUCH) 100 UNIT/ML Pen Inject 20 Units into the skin 2 (two) times daily. 15 mL 11  . Insulin Pen Needle (B-D UF III MINI PEN NEEDLES) 31G X 5 MM MISC Use to inject insulin 2 times per day. 100 each 5  . levETIRAcetam (KEPPRA) 500 MG tablet Take 1 tablet (500 mg total) by mouth 2 (two) times daily. 60 tablet 11  . metoprolol succinate (TOPROL XL) 25 MG 24 hr tablet Take 0.5 tablets (12.5 mg total) by mouth daily. 30 tablet 11  . ONETOUCH DELICA LANCETS FINE MISC Check blood sugar three times a day 100 each 5  . oxymetazoline (AFRIN) 0.05 % nasal spray Place 1 spray into both nostrils 2 (two) times daily as needed (Epistaxis). 30 mL 0  . pantoprazole (PROTONIX) 40 MG tablet Take 1 tablet (40 mg total) by mouth 2 (two) times daily. (Patient taking differently: Take 40 mg by mouth daily. ) 60 tablet 1  . potassium chloride SA (K-DUR,KLOR-CON) 20 MEQ tablet Take 1 tablet (20 mEq total) by mouth daily. 30 tablet 11  .  traMADol (ULTRAM) 50 MG tablet Take 50 mg by mouth as needed.     Facility-Administered Medications Prior to Visit  Medication Dose Route Frequency Provider Last Rate Last Dose  . 0.9 %  sodium chloride infusion  250 mL Intravenous Once [name], MD   250 mL at 02/06/15 1325    PAST MEDICAL HISTORY: Past Medical History  Diagnosis Date  . History of epistaxis   . Hyperlipidemia   . Telangiectasia     Gastric   . Lichen planus     Both lower extremities  . GERD (gastroesophageal reflux disease)   . Heart murmur   . CHF (congestive heart failure) ([place])   . History of blood transfusion "several"  . Chronic diastolic CHF (congestive heart failure) ([place]) 10/03/2013  . HTN (hypertension), benign 03/02/2012  . Pneumonia 1990's X 2  . Family history of anesthesia complication     "niece has a hard time coming out" (09/15/2012)  . Type II diabetes mellitus ([name]     insulin requiring.  . Seizures ([place]) 09/2014  . Frequent nosebleeds  chronic  . Chronic GI bleeding     [name] 11/29/2014  . Iron deficiency anemia     chronic infusions"  . Chronic anemia   . 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 Gastric AV malformation     /notes 11/29/2014    PAST SURGICAL HISTORY: Past Surgical History  Procedure Laterality Date  . Nasal hemorrhage control      "for bleeding"   . Savory dilation  02/26/2011    Procedure: SAVORY DILATION;  Surgeon: [name], MD;  Location: [name];  Service: Endoscopy;  Laterality: N/A;  c-arm needed  . Esophagogastroduodenoscopy  02/26/2011    Procedure: ESOPHAGOGASTRODUODENOSCOPY (EGD);  Surgeon: [name], MD;  Location: [name];  Service: Endoscopy;  Laterality: N/A;  . Esophagogastroduodenoscopy N/A 11/08/2012    Procedure: ESOPHAGOGASTRODUODENOSCOPY (EGD);  Surgeon: [name], MD;  Location: [name];  Service: Endoscopy;  Laterality: N/A;  . Cataract extraction      "I think it was just one eye"  .  Esophagogastroduodenoscopy N/A 10/04/2013    Procedure: ESOPHAGOGASTRODUODENOSCOPY (EGD);  Surgeon: Winfield Cunas., MD;  Location: [name];  Service: Endoscopy;  Laterality: N/A;  with APC on stand-by  . Esophagogastroduodenoscopy N/A 07/06/2014    Procedure: ESOPHAGOGASTRODUODENOSCOPY (EGD);  Surgeon: Clarene Essex, MD;  Location: [name];  Service: Endoscopy;  Laterality: N/A;  . Hot hemostasis N/A 07/06/2014    Procedure: HOT HEMOSTASIS (ARGON PLASMA COAGULATION/BICAP);  Surgeon: Clarene Essex, MD;  Location: [name];  Service: Endoscopy;  Laterality: N/A;  . Esophagogastroduodenoscopy N/A 09/05/2014    Procedure: ESOPHAGOGASTRODUODENOSCOPY (EGD);  Surgeon: Laurence Spates, MD;  Location: [name];  Service: Endoscopy;  Laterality: N/A;  APC on standby to control bleeding  . Esophagogastroduodenoscopy endoscopy  08/19/2006    with laser treatment  . Esophagogastroduodenoscopy N/A 11/29/2014    Procedure: ESOPHAGOGASTRODUODENOSCOPY (EGD);  Surgeon: [name], MD;  Location: [facility] ENDOSCOPY;  Service: Endoscopy;  Laterality: N/A;    FAMILY HISTORY: Family History  Problem Relation Age of Onset  . Breast cancer    . Malignant hyperthermia Neg Hx   . Seizures Neg Hx   . Stroke Father     SOCIAL HISTORY: Social History   Social History  . Marital Status: Married    Spouse Name: [name]  . Number of Children: 0  . Years of Education: 9   Occupational History  . Retired Engineer, manufacturing systems    Social History Main Topics  . Smoking status: Former Smoker -- 1.00 packs/day for 20 years    Types: Cigarettes    Quit date: 02/10/1971  . Smokeless tobacco: Never Used     Comment: 09/15/2012 "smoked 50-60 yr ago"  . Alcohol Use: No  . Drug Use: No  . Sexual Activity: Not on file   Other Topics Concern  . Not on file   Social History Narrative   Married, lives with husband, [name].  Ambulates without assistance.     Caffeine use: none      PHYSICAL EXAM  Filed Vitals:     08/07/15 1016  BP: 108/45  Pulse: 51  Height: '5\' 3"'$  (1.6 m)  Weight: 146 lb 6.4 oz (66.407 kg)   Body mass index is 25.94 kg/(m^2).   Montreal Cognitive Assessment  08/07/2015 01/30/2015  Visuospatial/ Executive (0/5) 2 1  Naming (0/3) 2 1  Attention: Read list of digits (0/2) 1 2  Attention: Read list of letters (0/1) 1 0  Attention: Serial 7 subtraction  starting at 100 (0/3) 0 0  Language: Repeat phrase (0/2) 2 2  Language : Fluency (0/1) 0 0  Abstraction (0/2) 2 2  Delayed Recall (0/5) 0 1  Orientation (0/6) 5 5  Total 15 14  Adjusted Score (based on education) - 15     Generalized: Well developed, in no acute distress   Neurological examination  Mentation: Alert. Follows all commands speech and language fluent Cranial nerve II-XII: Pupils were equal round reactive to light. Extraocular movements were full, visual field were full on confrontational test. Facial sensation and strength were normal. Uvula tongue midline. Head turning and shoulder shrug  were normal and symmetric. Motor: The motor testing reveals 5 over 5 strength of all 4 extremities. Good symmetric motor tone is noted throughout.  Sensory: Sensory testing is intact to soft touch on all 4 extremities. No evidence of extinction is noted.  Coordination: Cerebellar testing reveals good finger-nose-finger and heel-to-shin bilaterally.  Gait and station: Gait is normal. Tandem gait not attempted. Romberg is negative. No drift is seen.  Reflexes: Deep tendon reflexes are symmetric and normal bilaterally.   DIAGNOSTIC DATA (LABS, IMAGING, TESTING) - I reviewed patient records, labs, notes, testing and imaging myself where available.  Lab Results  Component Value Date   WBC 4.5 07/31/2015   HGB 9.0* 07/31/2015   HCT 26.1* 07/31/2015   MCV 91.9 07/31/2015   PLT 189 07/31/2015      Component Value Date/Time   NA 140 01/28/2015 0811   NA 144 04/04/2014 1019   K 3.5 01/28/2015 0811   K 4.5 04/04/2014 1019    CL 107 01/28/2015 0811   CL 111* 12/28/2011 0825   CO2 27 01/28/2015 0811   CO2 26 04/04/2014 1019   GLUCOSE 135* 01/28/2015 0811   GLUCOSE 156* 04/04/2014 1019   GLUCOSE 152* 12/28/2011 0825   BUN 16 01/28/2015 0811   BUN 16.9 04/04/2014 1019   CREATININE 0.92 01/28/2015 0811   CREATININE 0.80 11/30/2014 0537   CREATININE 0.9 04/04/2014 1019   CALCIUM 8.7 01/28/2015 0811   CALCIUM 9.3 04/04/2014 1019   PROT 4.9* 11/29/2014 0449   PROT 6.4 04/04/2014 1019   ALBUMIN 2.6* 11/29/2014 0449   ALBUMIN 3.3* 04/04/2014 1019   AST 22 11/29/2014 0449   AST 30 04/04/2014 1019   ALT 15 11/29/2014 0449   ALT 25 04/04/2014 1019   ALKPHOS 104 11/29/2014 0449   ALKPHOS 140 04/04/2014 1019   BILITOT 0.4 11/29/2014 0449   BILITOT 0.43 04/04/2014 1019   GFRNONAA >60 11/30/2014 0537   GFRAA >60 11/30/2014 0537   Lab Results  Component Value Date   CHOL 141 11/29/2014   HDL 31* 11/29/2014   LDLCALC 82 11/29/2014   TRIG 141 11/29/2014   CHOLHDL 4.5 11/29/2014   Lab Results  Component Value Date   HGBA1C 7.1 06/20/2015   Lab Results  Component Value Date   VITAMINB12 538 11/29/2014   Lab Results  Component Value Date   TSH 1.730 01/30/2015      ASSESSMENT AND PLAN 75 y.o. year old female  has a past medical history of History of epistaxis; Hyperlipidemia; Telangiectasia; Lichen planus; GERD (gastroesophageal reflux disease); Heart murmur; CHF (congestive heart failure) ([place]); History of blood transfusion ("several"); Chronic diastolic CHF (congestive heart failure) ([place]) (10/03/2013); HTN (hypertension), benign (03/02/2012); Pneumonia (1990's X 2); Family history of anesthesia complication; Type II diabetes mellitus ([place]); Seizures ([place]) (09/2014); Frequent nosebleeds; Chronic GI bleeding; Iron deficiency anemia; Chronic anemia; 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 here with:  1. Memory disturbance 2. Seizures  Overall the patient is doing well. She will  continue on Aricept. I will initiate Namenda. She'll begin taking 5 mg daily for one week then increase to 5 mg twice a day. If she is tolerating well and is visit we will increase her to the maintenance dose. The patient has not had any seizure events. She will continue on Keppra 500 mg twice a day. Patient advised that if her symptoms worsen or she develops any new symptoms she should let us know. She will follow-up in 3 months or sooner if needed.   Ward Givens, MSN, NP-C 08/07/2015, 10:40 AM [facility] [address] [place], [place] [address] [phone]    Personally participated in and made any corrections needed to history, physical, neuro exam,assessment and plan as stated above.  I have personally obtained the history, evaluated lab date, reviewed imaging studies and agree with radiology interpretations.    Sarina Ill, MD [facility]

## 2015-08-07 NOTE — Patient Instructions (Signed)
Continue Aricept 10 mg at bedtime- refill sent Begin Namenda 5 mg 1 tablet daily for 1 week then increase to 1 tablet twice a day If your symptoms worsen or you develop new symptoms please let us know.   Memantine Tablets What is this medicine? MEMANTINE (MEM an teen) is used to treat dementia caused by Alzheimer's disease. This medicine may be used for other purposes; ask your health care provider or pharmacist if you have questions. What should I tell my health care provider before I take this medicine? They need to know if you have any of these conditions: -difficulty passing urine -kidney disease -liver disease -seizures -an unusual or allergic reaction to memantine, other medicines, foods, dyes, or preservatives -pregnant or trying to get pregnant -breast-feeding How should I use this medicine? Take this medicine by mouth with a glass of water. Follow the directions on the prescription label. You may take this medicine with or without food. Take your doses at regular intervals. Do not take your medicine more often than directed. Continue to take your medicine even if you feel better. Do not stop taking except on the advice of your doctor or health care professional. Talk to your pediatrician regarding the use of this medicine in children. Special care may be needed. Overdosage: If you think you have taken too much of this medicine contact a poison control center or emergency room at once. NOTE: This medicine is only for you. Do not share this medicine with others. What if I miss a dose? If you miss a dose, take it as soon as you can. If it is almost time for your next dose, take only that dose. Do not take double or extra doses. If you do not take your medicine for several days, contact your health care provider. Your dose may need to be changed. What may interact with this  medicine? -acetazolamide -amantadine -cimetidine -dextromethorphan -dofetilide -hydrochlorothiazide -ketamine -metformin -methazolamide -quinidine -ranitidine -sodium bicarbonate -triamterene This list may not describe all possible interactions. Give your health care provider a list of all the medicines, herbs, non-prescription drugs, or dietary supplements you use. Also tell them if you smoke, drink alcohol, or use illegal drugs. Some items may interact with your medicine. What should I watch for while using this medicine? Visit your doctor or health care professional for regular checks on your progress. Check with your doctor or health care professional if there is no improvement in your symptoms or if they get worse. You may get drowsy or dizzy. Do not drive, use machinery, or do anything that needs mental alertness until you know how this drug affects you. Do not stand or sit up quickly, especially if you are an older patient. This reduces the risk of dizzy or fainting spells. Alcohol can make you more drowsy and dizzy. Avoid alcoholic drinks. What side effects may I notice from receiving this medicine? Side effects that you should report to your doctor or health care professional as soon as possible: -allergic reactions like skin rash, itching or hives, swelling of the face, lips, or tongue -agitation or a feeling of restlessness -depressed mood -dizziness -hallucinations -redness, blistering, peeling or loosening of the skin, including inside the mouth -seizures -vomiting Side effects that usually do not require medical attention (report to your doctor or health care professional if they continue or are bothersome): -constipation -diarrhea -headache -nausea -trouble sleeping This list may not describe all possible side effects. Call your doctor for medical advice about side effects. You  may report side effects to FDA at 1-800-FDA-1088. Where should I keep my medicine? Keep  out of the reach of children. Store at room temperature between 15 degrees and 30 degrees C (59 degrees and 86 degrees F). Throw away any unused medicine after the expiration date. NOTE: This sheet is a summary. It may not cover all possible information. If you have questions about this medicine, talk to your doctor, pharmacist, or health care provider.    2016, Elsevier/Gold Standard. (2012-11-14 14:10:42)

## 2015-08-10 ENCOUNTER — Other Ambulatory Visit: Payer: Self-pay | Admitting: Cardiology

## 2015-08-12 ENCOUNTER — Other Ambulatory Visit: Payer: Self-pay | Admitting: *Deleted

## 2015-08-12 DIAGNOSIS — I10 Essential (primary) hypertension: Secondary | ICD-10-CM

## 2015-08-12 MED ORDER — FUROSEMIDE 20 MG PO TABS: 20.0000 mg | ORAL_TABLET | Freq: Every day | ORAL | Status: DC

## 2015-08-15 ENCOUNTER — Other Ambulatory Visit: Payer: Self-pay | Admitting: Oncology

## 2015-08-15 ENCOUNTER — Other Ambulatory Visit (INDEPENDENT_AMBULATORY_CARE_PROVIDER_SITE_OTHER): Payer: Medicare Other

## 2015-08-15 ENCOUNTER — Telehealth: Payer: Self-pay | Admitting: *Deleted

## 2015-08-15 DIAGNOSIS — D62 Acute posthemorrhagic anemia: Secondary | ICD-10-CM | POA: Diagnosis not present

## 2015-08-15 DIAGNOSIS — Q2730 Arteriovenous malformation, site unspecified: Secondary | ICD-10-CM

## 2015-08-15 DIAGNOSIS — D649 Anemia, unspecified: Secondary | ICD-10-CM

## 2015-08-15 DIAGNOSIS — Q2733 Arteriovenous malformation of digestive system vessel: Secondary | ICD-10-CM

## 2015-08-15 DIAGNOSIS — I780 Hereditary hemorrhagic telangiectasia: Secondary | ICD-10-CM

## 2015-08-15 DIAGNOSIS — K31819 Angiodysplasia of stomach and duodenum without bleeding: Secondary | ICD-10-CM

## 2015-08-15 DIAGNOSIS — K922 Gastrointestinal hemorrhage, unspecified: Secondary | ICD-10-CM

## 2015-08-15 LAB — CBC WITH DIFFERENTIAL/PLATELET
BASOS ABS: 0 10*3/uL (ref 0.0–0.1)
BASOS PCT: 0 %
EOS PCT: 6 %
Eosinophils Absolute: 0.3 10*3/uL (ref 0.0–0.7)
HCT: 23.8 % — ABNORMAL LOW (ref 36.0–46.0)
Hemoglobin: 7.4 g/dL — ABNORMAL LOW (ref 12.0–15.0)
Lymphocytes Relative: 14 %
Lymphs Abs: 0.6 10*3/uL — ABNORMAL LOW (ref 0.7–4.0)
MCH: 30.8 pg (ref 26.0–34.0)
MCHC: 31.1 g/dL (ref 30.0–36.0)
MCV: 99.2 fL (ref 78.0–100.0)
MONO ABS: 0.4 10*3/uL (ref 0.1–1.0)
Monocytes Relative: 9 %
Neutro Abs: 3.4 10*3/uL (ref 1.7–7.7)
Neutrophils Relative %: 71 %
PLATELETS: 149 10*3/uL — AB (ref 150–400)
RBC: 2.4 MIL/uL — ABNORMAL LOW (ref 3.87–5.11)
RDW: 17.4 % — AB (ref 11.5–15.5)
WBC: 4.7 10*3/uL (ref 4.0–10.5)

## 2015-08-15 LAB — TYPE AND SCREEN
ABO/RH(D): A POS
ANTIBODY SCREEN: NEGATIVE

## 2015-08-15 NOTE — Telephone Encounter (Signed)
Pt's husband answered the phone and asked to take a message for [name]. He repeated the information back to me correctly - that pt needed a transfusion tomorrow and we had been able to get her an appt time at the [facility] tomorrow AM. She will need to have her T&S lab redrawn since the [facility] is part of [facility] and the lab drawn here at [facility] today could not be used for the transfusion at [facility]. Therefore, [name] is to arrive at 9:30A tomorrow, Friday, at the [facility] to have her blood drawn and should receive her transfusion approximately 10:30 AM tomorrow. Yvonna Alanis, RN, 08/15/15, 4P

## 2015-08-15 NOTE — Addendum Note (Signed)
Addended by: Truddie Crumble on: 08/15/2015 04:53 PM   Modules accepted: Orders

## 2015-08-16 ENCOUNTER — Ambulatory Visit (HOSPITAL_COMMUNITY)
Admission: RE | Admit: 2015-08-16 | Discharge: 2015-08-16 | Disposition: A | Discharge status: Home / Self Care | Payer: Medicare Other | Source: Ambulatory Visit | Attending: Oncology | Admitting: Oncology

## 2015-08-16 ENCOUNTER — Other Ambulatory Visit: Payer: Self-pay | Admitting: *Deleted

## 2015-08-16 ENCOUNTER — Other Ambulatory Visit: Payer: Medicare Other

## 2015-08-16 ENCOUNTER — Ambulatory Visit (HOSPITAL_BASED_OUTPATIENT_CLINIC_OR_DEPARTMENT_OTHER): Payer: Medicare Other

## 2015-08-16 VITALS — BP 130/40 | HR 48 | Temp 98.2°F | Resp 18

## 2015-08-16 DIAGNOSIS — D62 Acute posthemorrhagic anemia: Secondary | ICD-10-CM | POA: Insufficient documentation

## 2015-08-16 DIAGNOSIS — Z95828 Presence of other vascular implants and grafts: Secondary | ICD-10-CM

## 2015-08-16 DIAGNOSIS — I780 Hereditary hemorrhagic telangiectasia: Secondary | ICD-10-CM | POA: Insufficient documentation

## 2015-08-16 DIAGNOSIS — Q2730 Arteriovenous malformation, site unspecified: Secondary | ICD-10-CM | POA: Diagnosis not present

## 2015-08-16 DIAGNOSIS — K31811 Angiodysplasia of stomach and duodenum with bleeding: Secondary | ICD-10-CM

## 2015-08-16 DIAGNOSIS — K922 Gastrointestinal hemorrhage, unspecified: Secondary | ICD-10-CM

## 2015-08-16 DIAGNOSIS — K31819 Angiodysplasia of stomach and duodenum without bleeding: Secondary | ICD-10-CM

## 2015-08-16 LAB — PREPARE RBC (CROSSMATCH)

## 2015-08-16 MED ORDER — SODIUM CHLORIDE 0.9% FLUSH
10.0000 mL | INTRAVENOUS | Status: DC | PRN
  Administered 2015-08-16: 10 mL via INTRAVENOUS
  Filled 2015-08-16: qty 10

## 2015-08-16 MED ORDER — HEPARIN SOD (PORK) LOCK FLUSH 100 UNIT/ML IV SOLN
250.0000 [IU] | INTRAVENOUS | Status: DC | PRN
  Filled 2015-08-16: qty 5

## 2015-08-16 MED ORDER — HEPARIN SOD (PORK) LOCK FLUSH 100 UNIT/ML IV SOLN
500.0000 [IU] | Freq: Once | INTRAVENOUS | Status: AC
  Administered 2015-08-16: 500 [IU] via INTRAVENOUS
  Filled 2015-08-16: qty 5

## 2015-08-16 MED ORDER — SODIUM CHLORIDE 0.9% FLUSH
3.0000 mL | INTRAVENOUS | Status: DC | PRN
  Filled 2015-08-16: qty 10

## 2015-08-16 MED ORDER — SODIUM CHLORIDE 0.9 % IV SOLN
Freq: Once | INTRAVENOUS | Status: AC
  Administered 2015-08-16: 11:00:00 via INTRAVENOUS

## 2015-08-16 NOTE — Patient Instructions (Signed)

## 2015-08-19 LAB — TYPE AND SCREEN
ABO/RH(D): A POS
ANTIBODY SCREEN: NEGATIVE
UNIT DIVISION: 0
UNIT DIVISION: 0

## 2015-08-22 ENCOUNTER — Encounter: Payer: Self-pay | Admitting: Internal Medicine

## 2015-08-22 ENCOUNTER — Ambulatory Visit (INDEPENDENT_AMBULATORY_CARE_PROVIDER_SITE_OTHER): Payer: Medicare Other | Admitting: Internal Medicine

## 2015-08-22 VITALS — BP 122/34 | HR 57 | Temp 98.3°F | Ht 63.0 in | Wt 153.5 lb

## 2015-08-22 DIAGNOSIS — E7220 Disorder of urea cycle metabolism, unspecified: Secondary | ICD-10-CM

## 2015-08-22 LAB — GLUCOSE, CAPILLARY: GLUCOSE-CAPILLARY: 313 mg/dL — AB (ref 65–99)

## 2015-08-22 LAB — AMMONIA: AMMONIA: 227 umol/L — AB (ref 9–35)

## 2015-08-22 NOTE — Patient Instructions (Signed)
Thank you for coming to see me today. It was a pleasure. Today we talked about:   Ammonia levels in your blood and lactulose.  If your ammonia is elevated again today, I will call you to restart taking this medication.  Please follow-up with me in 3 months.  If you have any questions or concerns, please do not hesitate to call the office at [phone].  Take Care,   [name], DO

## 2015-08-23 MED ORDER — LACTULOSE 10 GM/15ML PO SOLN: 10.0000 g | Freq: Two times a day (BID) | ORAL | Status: DC

## 2015-08-23 NOTE — Assessment & Plan Note (Signed)
A: Patient had lactulose taken off medication list back in December 2016.  Level was high at that point to 180.  Rechecked again in February and was 133.  This visit, it was elevated again at 227.  P: - after receiving lab results, I called patient to inform her and asked her to resume lactulose 10g BID. - will have her follow up in about 1 month.

## 2015-08-23 NOTE — Progress Notes (Signed)
Patient ID: [name], female   DOB: July 20, 1940, 75 y.o.   MRN: [id number]   CC: here for follow up of elevated ammonia level  HPI:  [name] is a 75 y.o. woman with PMHx as below presenting today for follow up of elevated ammonia level.  Past Medical History  Diagnosis Date  . History of epistaxis   . Hyperlipidemia   . Telangiectasia     Gastric   . Lichen planus     Both lower extremities  . GERD (gastroesophageal reflux disease)   . Heart murmur   . CHF (congestive heart failure) ([place])   . History of blood transfusion "several"  . Chronic diastolic CHF (congestive heart failure) ([place]) 10/03/2013  . HTN (hypertension), benign 03/02/2012  . Pneumonia 1990's X 2  . Family history of anesthesia complication     "niece has a hard time coming out" (09/15/2012)  . Type II diabetes mellitus ([name]     insulin requiring.  . Seizures ([place]) 09/2014  . Frequent nosebleeds     chronic  . Chronic GI bleeding     [name] 11/29/2014  . Iron deficiency anemia     chronic infusions"  . Chronic anemia   . 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 Gastric AV malformation     /notes 11/29/2014    Review of Systems:   Review of Systems  Constitutional: Negative for fever and chills.  Respiratory: Negative for shortness of breath.   Cardiovascular: Negative for chest pain.     Physical Exam:  Filed Vitals:   08/22/15 1535  BP: 122/34  Pulse: 57  Temp: 98.3 F (36.8 C)  TempSrc: Oral  Height: 5\' 3"  (1.6 m)  Weight: 153 lb 8 oz (69.627 kg)  SpO2: 97%   Physical Exam  Constitutional: She is oriented to person, place, and time.  Pleasant, elderly female, sitting in wheelchair.  HENT:  Head: Normocephalic and atraumatic.  Eyes: EOM are normal.  Pulmonary/Chest: Effort normal.  Neurological: She is alert and oriented to person, place, and time.  Psychiatric: Mood and affect normal.     Assessment & Plan:   See encounters tab for problem based  medical decision making.   Patient discussed with Dr. Angelia Mould  Hyperammonemia A: Patient had lactulose taken off medication list back in December 2016.  Level was high at that point to 180.  Rechecked again in February and was 133.  This visit, it was elevated again at 227.  P: - after receiving lab results, I called patient to inform her and asked her to resume lactulose 10g BID. - will have her follow up in about 1 month.

## 2015-08-26 NOTE — Progress Notes (Signed)
Internal Medicine Clinic Attending  Case discussed with Dr. Juleen China at the time of the visit.  We reviewed the resident's history and exam and pertinent patient test results.  I agree with the assessment, diagnosis, and plan of care documented in the resident's note.  Elevated ammonia level possibly due to chronic GI bleed. Given her age and late presentation do not suspect a rare genetic cause, reasonable to restart lactulose and follow up.

## 2015-08-29 ENCOUNTER — Other Ambulatory Visit (INDEPENDENT_AMBULATORY_CARE_PROVIDER_SITE_OTHER): Payer: Medicare Other

## 2015-08-29 DIAGNOSIS — D62 Acute posthemorrhagic anemia: Secondary | ICD-10-CM

## 2015-08-29 DIAGNOSIS — Q2733 Arteriovenous malformation of digestive system vessel: Secondary | ICD-10-CM

## 2015-08-29 DIAGNOSIS — K31819 Angiodysplasia of stomach and duodenum without bleeding: Secondary | ICD-10-CM

## 2015-08-29 DIAGNOSIS — D649 Anemia, unspecified: Secondary | ICD-10-CM

## 2015-08-29 DIAGNOSIS — I780 Hereditary hemorrhagic telangiectasia: Secondary | ICD-10-CM

## 2015-08-29 LAB — CBC WITH DIFFERENTIAL/PLATELET
BASOS ABS: 0 10*3/uL (ref 0.0–0.1)
BASOS PCT: 1 %
EOS ABS: 0.4 10*3/uL (ref 0.0–0.7)
Eosinophils Relative: 7 %
HEMATOCRIT: 29.3 % — AB (ref 36.0–46.0)
HEMOGLOBIN: 9.4 g/dL — AB (ref 12.0–15.0)
Lymphocytes Relative: 16 %
Lymphs Abs: 0.8 10*3/uL (ref 0.7–4.0)
MCH: 29.8 pg (ref 26.0–34.0)
MCHC: 32.1 g/dL (ref 30.0–36.0)
MCV: 93 fL (ref 78.0–100.0)
Monocytes Absolute: 0.5 10*3/uL (ref 0.1–1.0)
Monocytes Relative: 10 %
NEUTROS ABS: 3.2 10*3/uL (ref 1.7–7.7)
NEUTROS PCT: 66 %
Platelets: 184 10*3/uL (ref 150–400)
RBC: 3.15 MIL/uL — AB (ref 3.87–5.11)
RDW: 15.9 % — ABNORMAL HIGH (ref 11.5–15.5)
WBC: 4.8 10*3/uL (ref 4.0–10.5)

## 2015-08-29 LAB — TYPE AND SCREEN
ABO/RH(D): A POS
ANTIBODY SCREEN: NEGATIVE

## 2015-08-30 ENCOUNTER — Other Ambulatory Visit: Payer: Self-pay | Admitting: Oncology

## 2015-08-30 NOTE — Addendum Note (Signed)
Addended by: Truddie Crumble on: 08/30/2015 04:54 PM   Modules accepted: Orders

## 2015-09-10 ENCOUNTER — Telehealth: Payer: Self-pay | Admitting: *Deleted

## 2015-09-10 ENCOUNTER — Other Ambulatory Visit (INDEPENDENT_AMBULATORY_CARE_PROVIDER_SITE_OTHER): Payer: Medicare Other

## 2015-09-10 DIAGNOSIS — Q2733 Arteriovenous malformation of digestive system vessel: Secondary | ICD-10-CM | POA: Diagnosis not present

## 2015-09-10 DIAGNOSIS — I780 Hereditary hemorrhagic telangiectasia: Secondary | ICD-10-CM

## 2015-09-10 DIAGNOSIS — D62 Acute posthemorrhagic anemia: Secondary | ICD-10-CM

## 2015-09-10 DIAGNOSIS — D649 Anemia, unspecified: Secondary | ICD-10-CM

## 2015-09-10 DIAGNOSIS — K31819 Angiodysplasia of stomach and duodenum without bleeding: Secondary | ICD-10-CM

## 2015-09-10 LAB — SAMPLE TO BLOOD BANK

## 2015-09-10 LAB — CBC WITH DIFFERENTIAL/PLATELET
BASOS PCT: 1 %
Basophils Absolute: 0 10*3/uL (ref 0.0–0.1)
Eosinophils Absolute: 0.3 10*3/uL (ref 0.0–0.7)
Eosinophils Relative: 8 %
HEMATOCRIT: 27.7 % — AB (ref 36.0–46.0)
HEMOGLOBIN: 8.6 g/dL — AB (ref 12.0–15.0)
LYMPHS ABS: 0.7 10*3/uL (ref 0.7–4.0)
LYMPHS PCT: 18 %
MCH: 27.6 pg (ref 26.0–34.0)
MCHC: 31 g/dL (ref 30.0–36.0)
MCV: 88.8 fL (ref 78.0–100.0)
MONOS PCT: 8 %
Monocytes Absolute: 0.3 10*3/uL (ref 0.1–1.0)
NEUTROS ABS: 2.6 10*3/uL (ref 1.7–7.7)
NEUTROS PCT: 65 %
Platelets: 197 10*3/uL (ref 150–400)
RBC: 3.12 MIL/uL — ABNORMAL LOW (ref 3.87–5.11)
RDW: 14.6 % (ref 11.5–15.5)
WBC: 3.9 10*3/uL — ABNORMAL LOW (ref 4.0–10.5)

## 2015-09-10 NOTE — Telephone Encounter (Signed)
-----   Message from [name], MD sent at 09/10/2015  3:30 PM EDT ----- Call pt: no need for transfusion this week

## 2015-09-10 NOTE — Telephone Encounter (Signed)
Pt called - left message w/husband; Hgb 8.6 :"no need for transfusion this week" per Dr [name]. Stated he will relay message to Mrs Beed.

## 2015-09-11 ENCOUNTER — Other Ambulatory Visit: Payer: Self-pay | Admitting: Cardiology

## 2015-09-11 NOTE — Telephone Encounter (Signed)
furosemide (LASIX) 20 MG tablet  Medication  Date: 08/12/2015 Department: [facility] Office Ordering/Authorizing: [name], MD  Order Providers   Prescribing Provider Encounter Provider  [name], MD Juventino Slovak, CMA  Medication Detail    Disp Refills Start End   furosemide (LASIX) 20 MG tablet 30 tablet 4 08/12/2015    Sig - Route: Take 1 tablet (20 mg total) by mouth daily. Please call and schedule an appointment - Oral   E-Prescribing Status: Receipt confirmed by pharmacy (08/12/2015 4:26 PM EDT)   Associated Diagnoses   Essential hypertension - Primary     Pharmacy   [facility] [place], [place]

## 2015-09-12 ENCOUNTER — Other Ambulatory Visit: Payer: Medicare Other

## 2015-09-12 ENCOUNTER — Other Ambulatory Visit: Payer: Self-pay | Admitting: *Deleted

## 2015-09-12 DIAGNOSIS — E1165 Type 2 diabetes mellitus with hyperglycemia: Principal | ICD-10-CM

## 2015-09-12 DIAGNOSIS — IMO0002 Reserved for concepts with insufficient information to code with codable children: Secondary | ICD-10-CM

## 2015-09-12 DIAGNOSIS — E1129 Type 2 diabetes mellitus with other diabetic kidney complication: Secondary | ICD-10-CM

## 2015-09-12 MED ORDER — GLIPIZIDE 5 MG PO TABS: 2.5000 mg | ORAL_TABLET | Freq: Every day | ORAL | 0 refills | Status: DC

## 2015-09-12 NOTE — Telephone Encounter (Signed)
Pt requesting 90 day supply. Thanks

## 2015-09-19 ENCOUNTER — Ambulatory Visit: Payer: Medicare Other | Admitting: Dietician

## 2015-09-24 ENCOUNTER — Other Ambulatory Visit (INDEPENDENT_AMBULATORY_CARE_PROVIDER_SITE_OTHER): Payer: Medicare Other

## 2015-09-24 ENCOUNTER — Other Ambulatory Visit: Payer: Self-pay | Admitting: Oncology

## 2015-09-24 DIAGNOSIS — D649 Anemia, unspecified: Secondary | ICD-10-CM

## 2015-09-24 DIAGNOSIS — Q2733 Arteriovenous malformation of digestive system vessel: Secondary | ICD-10-CM | POA: Diagnosis not present

## 2015-09-24 DIAGNOSIS — D62 Acute posthemorrhagic anemia: Secondary | ICD-10-CM | POA: Diagnosis not present

## 2015-09-24 DIAGNOSIS — I780 Hereditary hemorrhagic telangiectasia: Secondary | ICD-10-CM

## 2015-09-24 DIAGNOSIS — D500 Iron deficiency anemia secondary to blood loss (chronic): Secondary | ICD-10-CM

## 2015-09-24 DIAGNOSIS — K31819 Angiodysplasia of stomach and duodenum without bleeding: Secondary | ICD-10-CM

## 2015-09-24 LAB — SAMPLE TO BLOOD BANK

## 2015-09-24 LAB — CBC WITH DIFFERENTIAL/PLATELET
BASOS ABS: 0 10*3/uL (ref 0.0–0.1)
Basophils Relative: 0 %
Eosinophils Absolute: 0.3 10*3/uL (ref 0.0–0.7)
Eosinophils Relative: 6 %
HEMATOCRIT: 27.5 % — AB (ref 36.0–46.0)
Hemoglobin: 8.7 g/dL — ABNORMAL LOW (ref 12.0–15.0)
LYMPHS ABS: 0.6 10*3/uL — AB (ref 0.7–4.0)
LYMPHS PCT: 11 %
MCH: 28.7 pg (ref 26.0–34.0)
MCHC: 31.6 g/dL (ref 30.0–36.0)
MCV: 90.8 fL (ref 78.0–100.0)
MONO ABS: 0.3 10*3/uL (ref 0.1–1.0)
Monocytes Relative: 6 %
Neutro Abs: 4.5 10*3/uL (ref 1.7–7.7)
Neutrophils Relative %: 77 %
Platelets: 177 10*3/uL (ref 150–400)
RBC: 3.03 MIL/uL — ABNORMAL LOW (ref 3.87–5.11)
RDW: 16.7 % — AB (ref 11.5–15.5)
WBC: 5.8 10*3/uL (ref 4.0–10.5)

## 2015-09-24 LAB — FERRITIN: FERRITIN: 9 ng/mL — AB (ref 11–307)

## 2015-09-24 NOTE — Addendum Note (Signed)
Addended by: Truddie Crumble on: 09/24/2015 10:31 AM   Modules accepted: Orders

## 2015-09-25 ENCOUNTER — Telehealth: Payer: Self-pay | Admitting: *Deleted

## 2015-09-25 NOTE — Telephone Encounter (Signed)
Call to pt at 8,9,10 AM with no answer. Reached pt's husband by phone at 11:20 AM to explain lab results from yesterday per Dr. [name]: "no need for blood this week but I would like her to get feraheme IV iron." Appt made at [facility] for Friday, 09/27/15 at 12 noon. Pt's husband verbalized understanding of this information and repeated appt time back, stating that he and the pt would be at Short Stay as directed. Short Stay appt made by [name] in [place] this AM. Yvonna Alanis, RN, 09/25/15, 11:25 AM

## 2015-09-26 ENCOUNTER — Other Ambulatory Visit: Payer: Medicare Other

## 2015-09-27 ENCOUNTER — Ambulatory Visit (HOSPITAL_COMMUNITY)
Admission: RE | Admit: 2015-09-27 | Discharge: 2015-09-27 | Disposition: A | Discharge status: Home / Self Care | Payer: Medicare Other | Source: Ambulatory Visit | Attending: Oncology | Admitting: Oncology

## 2015-09-27 DIAGNOSIS — I780 Hereditary hemorrhagic telangiectasia: Secondary | ICD-10-CM | POA: Diagnosis not present

## 2015-09-27 DIAGNOSIS — D500 Iron deficiency anemia secondary to blood loss (chronic): Secondary | ICD-10-CM | POA: Diagnosis not present

## 2015-09-27 MED ORDER — SODIUM CHLORIDE 0.9 % IV SOLN
510.0000 mg | INTRAVENOUS | Status: DC
  Administered 2015-09-27: 510 mg via INTRAVENOUS
  Filled 2015-09-27: qty 17

## 2015-09-28 ENCOUNTER — Encounter (HOSPITAL_COMMUNITY): Payer: Self-pay | Admitting: Emergency Medicine

## 2015-09-28 ENCOUNTER — Inpatient Hospital Stay (HOSPITAL_COMMUNITY)
Admission: EM | Admit: 2015-09-28 | Discharge: 2015-09-29 | DRG: 357 | Disposition: A | Discharge status: Home / Self Care | Payer: Medicare Other | Attending: Gastroenterology | Admitting: Gastroenterology

## 2015-09-28 ENCOUNTER — Encounter (HOSPITAL_COMMUNITY)
Admission: EM | Disposition: A | Discharge status: Home / Self Care | Payer: Self-pay | Source: Home / Self Care | Attending: Gastroenterology

## 2015-09-28 ENCOUNTER — Inpatient Hospital Stay (HOSPITAL_COMMUNITY): Payer: Medicare Other | Admitting: Certified Registered Nurse Anesthetist

## 2015-09-28 ENCOUNTER — Other Ambulatory Visit: Payer: Self-pay

## 2015-09-28 DIAGNOSIS — K219 Gastro-esophageal reflux disease without esophagitis: Secondary | ICD-10-CM | POA: Diagnosis present

## 2015-09-28 DIAGNOSIS — D62 Acute posthemorrhagic anemia: Secondary | ICD-10-CM | POA: Diagnosis present

## 2015-09-28 DIAGNOSIS — R040 Epistaxis: Secondary | ICD-10-CM | POA: Diagnosis not present

## 2015-09-28 DIAGNOSIS — Z79899 Other long term (current) drug therapy: Secondary | ICD-10-CM | POA: Diagnosis not present

## 2015-09-28 DIAGNOSIS — E1129 Type 2 diabetes mellitus with other diabetic kidney complication: Secondary | ICD-10-CM | POA: Diagnosis not present

## 2015-09-28 DIAGNOSIS — I951 Orthostatic hypotension: Secondary | ICD-10-CM | POA: Diagnosis not present

## 2015-09-28 DIAGNOSIS — D72829 Elevated white blood cell count, unspecified: Secondary | ICD-10-CM | POA: Diagnosis not present

## 2015-09-28 DIAGNOSIS — E1165 Type 2 diabetes mellitus with hyperglycemia: Secondary | ICD-10-CM | POA: Diagnosis present

## 2015-09-28 DIAGNOSIS — K922 Gastrointestinal hemorrhage, unspecified: Secondary | ICD-10-CM

## 2015-09-28 DIAGNOSIS — I959 Hypotension, unspecified: Secondary | ICD-10-CM | POA: Diagnosis present

## 2015-09-28 DIAGNOSIS — I4891 Unspecified atrial fibrillation: Secondary | ICD-10-CM | POA: Diagnosis present

## 2015-09-28 DIAGNOSIS — K31819 Angiodysplasia of stomach and duodenum without bleeding: Secondary | ICD-10-CM

## 2015-09-28 DIAGNOSIS — K31811 Angiodysplasia of stomach and duodenum with bleeding: Principal | ICD-10-CM | POA: Diagnosis present

## 2015-09-28 DIAGNOSIS — I110 Hypertensive heart disease with heart failure: Secondary | ICD-10-CM | POA: Diagnosis present

## 2015-09-28 DIAGNOSIS — G40909 Epilepsy, unspecified, not intractable, without status epilepticus: Secondary | ICD-10-CM | POA: Diagnosis present

## 2015-09-28 DIAGNOSIS — I480 Paroxysmal atrial fibrillation: Secondary | ICD-10-CM | POA: Diagnosis present

## 2015-09-28 DIAGNOSIS — Z794 Long term (current) use of insulin: Secondary | ICD-10-CM | POA: Diagnosis not present

## 2015-09-28 DIAGNOSIS — I780 Hereditary hemorrhagic telangiectasia: Secondary | ICD-10-CM

## 2015-09-28 DIAGNOSIS — I5032 Chronic diastolic (congestive) heart failure: Secondary | ICD-10-CM | POA: Diagnosis not present

## 2015-09-28 DIAGNOSIS — E119 Type 2 diabetes mellitus without complications: Secondary | ICD-10-CM

## 2015-09-28 DIAGNOSIS — Z87891 Personal history of nicotine dependence: Secondary | ICD-10-CM

## 2015-09-28 HISTORY — PX: HOT HEMOSTASIS: SHX5433

## 2015-09-28 HISTORY — PX: ESOPHAGOGASTRODUODENOSCOPY: SHX5428

## 2015-09-28 LAB — COMPREHENSIVE METABOLIC PANEL
ALT: 19 U/L (ref 14–54)
AST: 27 U/L (ref 15–41)
Albumin: 2.6 g/dL — ABNORMAL LOW (ref 3.5–5.0)
Alkaline Phosphatase: 74 U/L (ref 38–126)
Anion gap: 4 — ABNORMAL LOW (ref 5–15)
BILIRUBIN TOTAL: 0.7 mg/dL (ref 0.3–1.2)
BUN: 16 mg/dL (ref 6–20)
CALCIUM: 8.8 mg/dL — AB (ref 8.9–10.3)
CO2: 29 mmol/L (ref 22–32)
CREATININE: 1.53 mg/dL — AB (ref 0.44–1.00)
Chloride: 109 mmol/L (ref 101–111)
GFR, EST AFRICAN AMERICAN: 37 mL/min — AB (ref >60)
GFR, EST NON AFRICAN AMERICAN: 32 mL/min — AB (ref >60)
Glucose, Bld: 263 mg/dL — ABNORMAL HIGH (ref 65–99)
Potassium: 3.6 mmol/L (ref 3.5–5.1)
Sodium: 142 mmol/L (ref 135–145)
TOTAL PROTEIN: 4.7 g/dL — AB (ref 6.5–8.1)

## 2015-09-28 LAB — PROTIME-INR
INR: 1.25
Prothrombin Time: 15.8 seconds — ABNORMAL HIGH (ref 11.4–15.2)

## 2015-09-28 LAB — MRSA PCR SCREENING: MRSA BY PCR: NEGATIVE

## 2015-09-28 LAB — CBC WITH DIFFERENTIAL/PLATELET
BASOS ABS: 0 10*3/uL (ref 0.0–0.1)
BASOS PCT: 0 %
EOS ABS: 0.4 10*3/uL (ref 0.0–0.7)
EOS PCT: 4 %
HCT: 20.5 % — ABNORMAL LOW (ref 36.0–46.0)
HEMOGLOBIN: 6.5 g/dL — AB (ref 12.0–15.0)
Lymphocytes Relative: 16 %
Lymphs Abs: 1.7 10*3/uL (ref 0.7–4.0)
MCH: 28.3 pg (ref 26.0–34.0)
MCHC: 31.2 g/dL (ref 30.0–36.0)
MCV: 90.7 fL (ref 78.0–100.0)
Monocytes Absolute: 0.8 10*3/uL (ref 0.1–1.0)
Monocytes Relative: 7 %
NEUTROS PCT: 72 %
Neutro Abs: 7.8 10*3/uL — ABNORMAL HIGH (ref 1.7–7.7)
PLATELETS: 208 10*3/uL (ref 150–400)
RBC: 2.26 MIL/uL — AB (ref 3.87–5.11)
RDW: 16.3 % — ABNORMAL HIGH (ref 11.5–15.5)
WBC: 10.7 10*3/uL — AB (ref 4.0–10.5)

## 2015-09-28 LAB — GLUCOSE, CAPILLARY
GLUCOSE-CAPILLARY: 175 mg/dL — AB (ref 65–99)
GLUCOSE-CAPILLARY: 153 mg/dL — AB (ref 65–99)
GLUCOSE-CAPILLARY: 162 mg/dL — AB (ref 65–99)
Glucose-Capillary: 113 mg/dL — ABNORMAL HIGH (ref 65–99)

## 2015-09-28 LAB — CBC
HCT: 29.5 % — ABNORMAL LOW (ref 36.0–46.0)
Hemoglobin: 9.4 g/dL — ABNORMAL LOW (ref 12.0–15.0)
MCH: 28.6 pg (ref 26.0–34.0)
MCHC: 31.9 g/dL (ref 30.0–36.0)
MCV: 89.7 fL (ref 78.0–100.0)
PLATELETS: 158 10*3/uL (ref 150–400)
RBC: 3.29 MIL/uL — ABNORMAL LOW (ref 3.87–5.11)
RDW: 15.8 % — AB (ref 11.5–15.5)
WBC: 9.5 10*3/uL (ref 4.0–10.5)

## 2015-09-28 LAB — AMMONIA: AMMONIA: 157 umol/L — AB (ref 9–35)

## 2015-09-28 LAB — POC OCCULT BLOOD, ED: Fecal Occult Bld: POSITIVE — AB

## 2015-09-28 LAB — POCT I-STAT 4, (NA,K, GLUC, HGB,HCT)
Glucose, Bld: 193 mg/dL — ABNORMAL HIGH (ref 65–99)
HCT: 27 % — ABNORMAL LOW (ref 36.0–46.0)
Hemoglobin: 9.2 g/dL — ABNORMAL LOW (ref 12.0–15.0)
Potassium: 4.9 mmol/L (ref 3.5–5.1)
SODIUM: 145 mmol/L (ref 135–145)

## 2015-09-28 LAB — APTT: aPTT: 28 seconds (ref 24–36)

## 2015-09-28 LAB — PREPARE RBC (CROSSMATCH)

## 2015-09-28 SURGERY — EGD (ESOPHAGOGASTRODUODENOSCOPY)
Anesthesia: General

## 2015-09-28 MED ORDER — SODIUM CHLORIDE 0.9 % IV BOLUS (SEPSIS)
1000.0000 mL | Freq: Once | INTRAVENOUS | Status: AC
  Administered 2015-09-28: 1000 mL via INTRAVENOUS

## 2015-09-28 MED ORDER — FERROUS FUMARATE 324 (106 FE) MG PO TABS
1.0000 | ORAL_TABLET | Freq: Two times a day (BID) | ORAL | Status: DC
  Administered 2015-09-28 – 2015-09-29 (×2): 106 mg via ORAL
  Filled 2015-09-28 (×2): qty 1

## 2015-09-28 MED ORDER — SODIUM CHLORIDE 0.9 % IV SOLN
10.0000 mL/h | Freq: Once | INTRAVENOUS | Status: AC
  Administered 2015-09-28: 10 mL/h via INTRAVENOUS

## 2015-09-28 MED ORDER — PROPOFOL 10 MG/ML IV BOLUS
INTRAVENOUS | Status: DC | PRN
  Administered 2015-09-28: 130 mg via INTRAVENOUS

## 2015-09-28 MED ORDER — ACETAMINOPHEN 650 MG RE SUPP: 650.0000 mg | Freq: Four times a day (QID) | RECTAL | Status: DC | PRN

## 2015-09-28 MED ORDER — ONDANSETRON HCL 4 MG/2ML IJ SOLN
INTRAMUSCULAR | Status: DC | PRN
  Administered 2015-09-28: 4 mg via INTRAVENOUS

## 2015-09-28 MED ORDER — LIDOCAINE HCL (CARDIAC) 20 MG/ML IV SOLN
INTRAVENOUS | Status: DC | PRN
  Administered 2015-09-28: 100 mg via INTRATRACHEAL

## 2015-09-28 MED ORDER — SODIUM CHLORIDE 0.9% FLUSH
3.0000 mL | Freq: Two times a day (BID) | INTRAVENOUS | Status: DC
  Administered 2015-09-28 – 2015-09-29 (×2): 3 mL via INTRAVENOUS

## 2015-09-28 MED ORDER — LACTATED RINGERS IV SOLN: INTRAVENOUS | Status: DC

## 2015-09-28 MED ORDER — METOPROLOL SUCCINATE ER 25 MG PO TB24
12.5000 mg | ORAL_TABLET | Freq: Every day | ORAL | Status: DC
  Administered 2015-09-28: 12.5 mg via ORAL
  Filled 2015-09-28: qty 1

## 2015-09-28 MED ORDER — SODIUM CHLORIDE 0.9 % IV SOLN: INTRAVENOUS | Status: DC

## 2015-09-28 MED ORDER — INSULIN ASPART 100 UNIT/ML ~~LOC~~ SOLN
0.0000 [IU] | Freq: Three times a day (TID) | SUBCUTANEOUS | Status: DC
  Administered 2015-09-28: 2 [IU] via SUBCUTANEOUS

## 2015-09-28 MED ORDER — LEVETIRACETAM 500 MG PO TABS
500.0000 mg | ORAL_TABLET | Freq: Two times a day (BID) | ORAL | Status: DC
  Administered 2015-09-28 – 2015-09-29 (×2): 500 mg via ORAL
  Filled 2015-09-28 (×2): qty 1

## 2015-09-28 MED ORDER — SODIUM CHLORIDE 0.9 % IV SOLN
INTRAVENOUS | Status: DC | PRN
  Administered 2015-09-28: 12:00:00 via INTRAVENOUS

## 2015-09-28 MED ORDER — ACETAMINOPHEN 325 MG PO TABS
650.0000 mg | ORAL_TABLET | Freq: Four times a day (QID) | ORAL | Status: DC | PRN
Start: 2015-09-28 — End: 2015-09-29

## 2015-09-28 MED ORDER — PANTOPRAZOLE SODIUM 40 MG PO TBEC
40.0000 mg | DELAYED_RELEASE_TABLET | Freq: Two times a day (BID) | ORAL | Status: DC
  Administered 2015-09-28 – 2015-09-29 (×2): 40 mg via ORAL
  Filled 2015-09-28 (×2): qty 1

## 2015-09-28 MED ORDER — OXYMETAZOLINE HCL 0.05 % NA SOLN
1.0000 | Freq: Two times a day (BID) | NASAL | Status: DC | PRN
  Filled 2015-09-28: qty 15

## 2015-09-28 MED ORDER — FERROUS FUMARATE 325 (106 FE) MG PO TABS: 1.0000 | ORAL_TABLET | Freq: Two times a day (BID) | ORAL | Status: DC

## 2015-09-28 MED ORDER — INSULIN DETEMIR 100 UNIT/ML ~~LOC~~ SOLN
10.0000 [IU] | Freq: Two times a day (BID) | SUBCUTANEOUS | Status: DC
  Administered 2015-09-28 – 2015-09-29 (×2): 10 [IU] via SUBCUTANEOUS
  Filled 2015-09-28 (×3): qty 0.1

## 2015-09-28 MED ORDER — PHENYLEPHRINE HCL 10 MG/ML IJ SOLN
INTRAMUSCULAR | Status: DC | PRN
  Administered 2015-09-28: 60 ug/min via INTRAVENOUS

## 2015-09-28 MED ORDER — SODIUM CHLORIDE 0.9 % IV SOLN
INTRAVENOUS | Status: AC
  Administered 2015-09-28: 17:00:00 via INTRAVENOUS

## 2015-09-28 MED ORDER — SUCCINYLCHOLINE CHLORIDE 20 MG/ML IJ SOLN
INTRAMUSCULAR | Status: DC | PRN
  Administered 2015-09-28: 100 mg via INTRAVENOUS

## 2015-09-28 NOTE — ED Notes (Signed)
Family at bedside. 

## 2015-09-28 NOTE — ED Provider Notes (Signed)
[facility] DEPT Provider Note   CSN: [id number] Arrival date & time: 09/28/15  1216   By signing my name below, I, [name], attest that this documentation has been prepared under the direction and in the presence of [name], MD . Electronically Signed: Estanislado [name], Scribe. 09/28/2015. 3:35 AM.    History   Chief Complaint Chief Complaint  Patient presents with  . Hypotension  . Nausea    HPI [name] is a 75 y.o. female.  The history is provided by the EMS personnel and the patient. No language interpreter was used.  Emesis   This is a new problem. The current episode started less than 1 hour ago. The emesis has an appearance of bright red blood. There has been no fever. Associated symptoms include abdominal pain.   HPI Comments:  [name] is a 75 y.o. female with a history of HTN, frequent epistaxis, seizures and chronic GI bleeding who presents to the Emergency Department via EMS, here due to several episodes of hematemesis that began 45-60 minutes prior to arrival. Per EMS, pt began experiencing nausea and abdominal pain followed by several episodes of hematemesis. Noted blood clots reported per EMS. En route, pt was hypotensive at 79/37 and bradycardic at 50. Pt's glucose was at 272 en route. EMS states pt experienced a seizure lasting approximately 4 seconds but she has h/o seizures . Pt denies any use of anticoagulates.  Her course is worsening.    Pt reports an episode of epistaxis yesterday. However, she was not evaluated in the Emergency Department. Per EMS, pt plugged her nose in effort to stop the bleeding.   Past Medical History:  Diagnosis Date  . CHF (congestive heart failure) ([place])   . Chronic anemia   . Chronic diastolic CHF (congestive heart failure) ([place]) 10/03/2013  . Chronic GI bleeding    [name] 11/29/2014  . Family history of anesthesia complication    "niece has a hard time coming out" (09/15/2012)  . Frequent nosebleeds     chronic  . Gastric AV malformation    [name] 11/29/2014  . GERD (gastroesophageal reflux disease)   . Heart murmur   . History of blood transfusion "several"  . History of epistaxis   . HTN (hypertension), benign 03/02/2012  . Hyperlipidemia   . Iron deficiency anemia    chronic infusions"  . Lichen planus    Both lower extremities  . 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 Pneumonia 1990's X 2  . Seizures ([place]) 09/2014  . Telangiectasia    Gastric   . 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    Patient Active Problem List   Diagnosis Date Noted  . Lichen planus 24/46/9507  . Healthcare maintenance 06/20/2015  . Seborrheic keratosis 06/20/2015  . Memory loss 01/30/2015  . Symptomatic anemia 11/29/2014  . Hypotension 11/13/2014  . Convulsions/seizures ([place]) 10/21/2014  . Hyperammonemia ([place]) 10/03/2014  . Chronic kidney disease, stage III (moderate)   . Acute blood loss anemia   . Acute renal failure superimposed on stage 3 chronic kidney disease ([place]) 08/27/2014  . Anemia of chronic disease 08/27/2014  . Chronic diastolic CHF (congestive heart failure) ([place]) 10/03/2013  . DM (diabetes mellitus), type 2, uncontrolled, with renal complications ([place]) 22/57/5051  . HTN (hypertension), benign 03/02/2012  . 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 Iron deficiency anemia 04/21/2006    Past Surgical History:  Procedure Laterality Date  .  CATARACT EXTRACTION     "I think it was just one eye"  . ESOPHAGOGASTRODUODENOSCOPY  02/26/2011   Procedure: ESOPHAGOGASTRODUODENOSCOPY (EGD);  Surgeon: [name], MD;  Location: [name];  Service: Endoscopy;  Laterality: N/A;  . ESOPHAGOGASTRODUODENOSCOPY N/A 11/08/2012   Procedure: ESOPHAGOGASTRODUODENOSCOPY (EGD);  Surgeon: [name], MD;  Location: [name];  Service: Endoscopy;  Laterality: N/A;  . ESOPHAGOGASTRODUODENOSCOPY N/A 10/04/2013   Procedure:  ESOPHAGOGASTRODUODENOSCOPY (EGD);  Surgeon: Winfield Cunas., MD;  Location: [name];  Service: Endoscopy;  Laterality: N/A;  with APC on stand-by  . ESOPHAGOGASTRODUODENOSCOPY N/A 07/06/2014   Procedure: ESOPHAGOGASTRODUODENOSCOPY (EGD);  Surgeon: Clarene Essex, MD;  Location: [name];  Service: Endoscopy;  Laterality: N/A;  . ESOPHAGOGASTRODUODENOSCOPY N/A 09/05/2014   Procedure: ESOPHAGOGASTRODUODENOSCOPY (EGD);  Surgeon: Laurence Spates, MD;  Location: [name];  Service: Endoscopy;  Laterality: N/A;  APC on standby to control bleeding  . ESOPHAGOGASTRODUODENOSCOPY N/A 11/29/2014   Procedure: ESOPHAGOGASTRODUODENOSCOPY (EGD);  Surgeon: [name], MD;  Location: [facility] ENDOSCOPY;  Service: Endoscopy;  Laterality: N/A;  . ESOPHAGOGASTRODUODENOSCOPY ENDOSCOPY  08/19/2006   with laser treatment  . HOT HEMOSTASIS N/A 07/06/2014   Procedure: HOT HEMOSTASIS (ARGON PLASMA COAGULATION/BICAP);  Surgeon: Clarene Essex, MD;  Location: [name];  Service: Endoscopy;  Laterality: N/A;  . NASAL HEMORRHAGE CONTROL     "for bleeding"   . SAVORY DILATION  02/26/2011   Procedure: SAVORY DILATION;  Surgeon: [name], MD;  Location: [name];  Service: Endoscopy;  Laterality: N/A;  c-arm needed    OB History    No data available       Home Medications    Prior to Admission medications   Medication Sig Start Date End Date Taking? Authorizing Provider  AMITIZA 24 MCG capsule Take 24 mcg by mouth 2 (two) times daily. 08/29/14   Historical Provider, MD  Blood Glucose Monitoring Suppl (ONETOUCH VERIO) W/DEVICE KIT 1 each by Does not apply route 3 (three) times daily. 12/25/14   [name], DO  donepezil (ARICEPT) 10 MG tablet Take 1 tablet (10 mg total) by mouth at bedtime. 08/07/15   Ward Givens, NP  ferrous fumarate (HEMOCYTE - 106 MG FE) 325 (106 FE) MG TABS tablet Take 1 tablet by mouth daily. Reported on 01/30/2015    Historical Provider, MD  furosemide (LASIX) 20 MG tablet  Take 1 tablet (20 mg total) by mouth daily. Please call and schedule an appointment 08/12/15   [name], MD  glipiZIDE (GLUCOTROL) 5 MG tablet Take 0.5 tablets (2.5 mg total) by mouth daily before breakfast. 09/12/15   [name], DO  glucose blood [facility] VERIO) test strip Check blood sugar three times a day 12/25/14   [name], DO  Insulin Detemir (LEVEMIR FLEXTOUCH) 100 UNIT/ML Pen Inject 20 Units into the skin 2 (two) times daily. 07/25/15   [name], DO  Insulin Pen Needle (B-D UF III MINI PEN NEEDLES) 31G X 5 MM MISC Use to inject insulin 2 times per day. 07/25/15   [name], DO  lactulose (CHRONULAC) 10 GM/15ML solution Take 15 mLs (10 g total) by mouth 2 (two) times daily. 08/23/15   [name], DO  levETIRAcetam (KEPPRA) 500 MG tablet Take 1 tablet (500 mg total) by mouth 2 (two) times daily. 01/30/15   Melvenia Beam, MD  memantine (NAMENDA) 5 MG tablet Take 1 tablet PO daily for 1 week then increase to 1 tablet  PO BID. 08/07/15   Ward Givens, NP  metoprolol  succinate (TOPROL XL) 25 MG 24 hr tablet Take 0.5 tablets (12.5 mg total) by mouth daily. 11/28/14   Liliane Shi, PA-C  ONETOUCH DELICA LANCETS FINE MISC Check blood sugar three times a day 12/25/14   [name], DO  oxymetazoline (AFRIN) 0.05 % nasal spray Place 1 spray into both nostrils 2 (two) times daily as needed (Epistaxis). 10/05/13   Robbie Lis, MD  pantoprazole (PROTONIX) 40 MG tablet Take 1 tablet (40 mg total) by mouth 2 (two) times daily. Patient taking differently: Take 40 mg by mouth daily.  07/07/14   Hosie Poisson, MD  potassium chloride SA (K-DUR,KLOR-CON) 20 MEQ tablet Take 1 tablet (20 mEq total) by mouth daily. 01/28/15   [name], MD  traMADol (ULTRAM) 50 MG tablet Take 50 mg by mouth as needed. 12/01/14   Historical Provider, MD    Family History Family History  Problem Relation Age of Onset  . Breast cancer    . Malignant hyperthermia Neg Hx   . Seizures Neg Hx     . Stroke Father     Social History Social History  Substance Use Topics  . Smoking status: Former Smoker    Packs/day: 1.00    Years: 20.00    Types: Cigarettes    Quit date: 02/10/1971  . Smokeless tobacco: Never Used     Comment: 09/15/2012 "smoked 50-60 yr ago"  . Alcohol use No     Allergies   Aspirin   Review of Systems Review of Systems  HENT: Positive for nosebleeds.   Gastrointestinal: Positive for abdominal pain, nausea and vomiting.  All other systems reviewed and are negative.    Physical Exam Updated Vital Signs BP 100/80 (BP Location: Right Arm)   Pulse 66   Temp 97.3 F (36.3 C) (Oral)   Resp 18   SpO2 97%   Physical Exam CONSTITUTIONAL: Elderly and ill appearing HEAD: Normocephalic/atraumatic EYES: EOMI/PERRL; conjunctiva pale ENMT: Mucous membranes dry; no blood noted in mouth NECK: supple no meningeal signs CV: S1/S2 noted, no murmurs/rubs/gallops noted LUNGS: Lungs are clear to auscultation bilaterally, no apparent distress ABDOMEN: soft, nontender, no rebound or guarding, bowel sounds noted throughout abdomen RECTAL: Dark stool noted, hemoccult positive NEURO: Pt somnolent but easily arousable, moves all extremities x4   EXTREMITIES: pulses normal/equal, full ROM SKIN: warm, color normal PSYCH: unable to assess   ED Treatments / Results  DIAGNOSTIC STUDIES:  Oxygen Saturation is 97% on RA, adequate by my interpretation.    COORDINATION OF CARE:  3:35 AM Will order blood transfusion. Will give fluids. Discussed treatment plan with pt at bedside and pt agreed to plan.  Labs (all labs ordered are listed, but only abnormal results are displayed) Labs Reviewed  COMPREHENSIVE METABOLIC PANEL - Abnormal; Notable for the following:       Result Value   Glucose, Bld 263 (*)    Creatinine, Ser 1.53 (*)    Calcium 8.8 (*)    Total Protein 4.7 (*)    Albumin 2.6 (*)    GFR calc non Af Amer 32 (*)    GFR calc Af Amer 37 (*)    Anion gap 4  (*)    All other components within normal limits  CBC WITH DIFFERENTIAL/PLATELET - Abnormal; Notable for the following:    WBC 10.7 (*)    RBC 2.26 (*)    Hemoglobin 6.5 (*)    HCT 20.5 (*)    RDW 16.3 (*)    Neutro Abs  7.8 (*)    All other components within normal limits  PROTIME-INR - Abnormal; Notable for the following:    Prothrombin Time 15.8 (*)    All other components within normal limits  AMMONIA - Abnormal; Notable for the following:    Ammonia 157 (*)    All other components within normal limits  POC OCCULT BLOOD, ED - Abnormal; Notable for the following:    Fecal Occult Bld POSITIVE (*)    All other components within normal limits  APTT  TYPE AND SCREEN  PREPARE RBC (CROSSMATCH)    EKG  EKG Interpretation  Date/Time:  Saturday September 28 2015 03:31:06 EDT Ventricular Rate:  60 PR Interval:    QRS Duration: 92 QT Interval:  457 QTC Calculation: 457 R Axis:   38 Text Interpretation:  Sinus rhythm Consider RVH or posterior infarct No significant change since last tracing Confirmed by Christy Gentles  MD, [place] ([id number]) on 09/28/2015 3:46:49 AM       Radiology No results found.  Procedures Procedures  CRITICAL CARE Performed by: Sharyon Cable Total critical care time: 35 minutes Critical care time was exclusive of separately billable procedures and treating other patients. Critical care was necessary to treat or prevent imminent or life-threatening deterioration. Critical care was time spent personally by me on the following activities: development of treatment plan with patient and/or surrogate as well as nursing, discussions with consultants, evaluation of patient's response to treatment, examination of patient, obtaining history from patient or surrogate, ordering and performing treatments and interventions, ordering and review of laboratory studies, ordering and review of radiographic studies, pulse oximetry and re-evaluation of patient's condition. PATIENT  WITH ACUTE UPPER GI BLEED WITH HEMOGLOBIN AT 6.5 REQUIRING BLOOD TRANSFUSION AND ADMISSION  Medications Ordered in ED Medications  sodium chloride 0.9 % bolus 1,000 mL (0 mLs Intravenous Stopped 09/28/15 0453)  0.9 %  sodium chloride infusion (10 mL/hr Intravenous New Bag/Given 09/28/15 0453)     Initial Impression / Assessment and Plan / ED Course  I have reviewed the triage vital signs and the nursing notes.  Pertinent labs  results that were available during my care of the patient were reviewed by me and considered in my medical decision making (see chart for details).  Clinical Course    Pt seen on arrival.  She was ill appearing, mildly somnolent and hypotensive She was placed in trendelenburg, give IV fluids and she started to improve Mental status improved BP improved She has evidence of acute upper GI bleed Per records, she has h/o gastric AVM and has required endoscopy intervention previously  5:33 AM D/w dr Watt Climes with GI He is aware of patient and aware of acute GI bleed with anemia 6:22 AM D/w internal medicine resident dr Joya Salm Will admit to stepdown unit Pt stabilized in the ED BP (!) 103/47   Pulse 64   Temp 97.8 F (36.6 C) (Oral)   Resp 22   SpO2 100%   Final Clinical Impressions(s) / ED Diagnoses   Final diagnoses:  Acute GI bleeding  Acute blood loss anemia    New Prescriptions New Prescriptions   No medications on file  I personally performed the services described in this documentation, which was scribed in my presence. The recorded information has been reviewed and is accurate.        [name], MD 09/28/15 [phone]

## 2015-09-28 NOTE — H&P (Signed)
Date: 09/28/2015               Patient Name:  [name] MRN: [id number]  DOB: January 05, 1941 Age / Sex: 75 y.o., female   PCP: [name], DO         Medical Service: Internal Medicine Teaching Service         Attending Physician: Dr. [name], MD    First Contact: Dr. Ledell Noss Pager: [phone]  Second Contact: Dr. Jacques Earthly Pager: [phone]       After Hours (After 5p/  First Contact Pager: [phone]  weekends / holidays): Second Contact Pager: [phone]   Chief Complaint: Hematemesis  History of Present Illness:  [name] is a 75 year old woman with PMH HTN, frequent epistaxis, seizures, T2DM, CHF, chronic GIB 2/2 to AVMs, chronic anemia who presented to the ED via EMS after acute onset hematemesis that began tonight. She awoke from sleep and began vomiting large volumes of coffee ground emesis. She got up to go to the bathroom and eventually fell to the floor (no LOC, no trauma). She endorses black tarry stools for 3-4 days and intermittent mild epistaxis (most recently tonight, but this is longstanding issue). She denies dizziness, chest pain, nausea, abdominal pain, palpitations, hematuria. She recently received IV iron for her chronic anemia.  En route she was hypotensive at 79/37, HR 50, CBG 272, experienced 4 second seizure per EMS.   In the ED afebrile, HR 66, RR 18, 100/80 s/p 1 L bolus, 97% on 4L  [place]. Labs remarkable for Hgb 6.5 from 8.7 on 8/15, FOBT positive, Cr 1.53 from baseline 0.8-0.9,PT/INR/PTT wnl, glucose 263. Transfusion of 2 units pRBC begun.   Meds:  Current Meds  Medication Sig  . AMITIZA 24 MCG capsule Take 24 mcg by mouth 2 (two) times daily.  Marland Kitchen donepezil (ARICEPT) 10 MG tablet Take 1 tablet (10 mg total) by mouth at bedtime.  . ferrous fumarate (HEMOCYTE - 106 MG FE) 325 (106 FE) MG TABS tablet Take 1 tablet by mouth 2 (two) times daily. Reported on 01/30/2015  . furosemide (LASIX) 20 MG tablet Take 1 tablet (20 mg total) by mouth  daily. Please call and schedule an appointment  . glipiZIDE (GLUCOTROL) 5 MG tablet Take 0.5 tablets (2.5 mg total) by mouth daily before breakfast.  . Insulin Detemir (LEVEMIR FLEXTOUCH) 100 UNIT/ML Pen Inject 20 Units into the skin 2 (two) times daily.  Marland Kitchen lactulose (CHRONULAC) 10 GM/15ML solution Take 15 mLs (10 g total) by mouth 2 (two) times daily.  Marland Kitchen levETIRAcetam (KEPPRA) 500 MG tablet Take 1 tablet (500 mg total) by mouth 2 (two) times daily.  . memantine (NAMENDA) 5 MG tablet Take 1 tablet PO daily for 1 week then increase to 1 tablet  PO BID.  Marland Kitchen metoprolol succinate (TOPROL XL) 25 MG 24 hr tablet Take 0.5 tablets (12.5 mg total) by mouth daily.  Marland Kitchen oxymetazoline (AFRIN) 0.05 % nasal spray Place 1 spray into both nostrils 2 (two) times daily as needed (Epistaxis).  . pantoprazole (PROTONIX) 40 MG tablet Take 1 tablet (40 mg total) by mouth 2 (two) times daily.  . potassium chloride SA (K-DUR,KLOR-CON) 20 MEQ tablet Take 1 tablet (20 mEq total) by mouth daily.  . traMADol (ULTRAM) 50 MG tablet Take 50 mg by mouth as needed for moderate pain.    Allergies: Allergies as of 09/28/2015 - Review Complete 09/28/2015  Allergen Reaction Noted  . Aspirin Nausea And Vomiting 05/12/2006  Past Medical History:  Diagnosis Date  . CHF (congestive heart failure) ([place])   . Chronic anemia   . Chronic diastolic CHF (congestive heart failure) ([place]) 10/03/2013  . Chronic GI bleeding    [name] 11/29/2014  . Family history of anesthesia complication    "niece has a hard time coming out" (09/15/2012)  . Frequent nosebleeds    chronic  . Gastric AV malformation    [name] 11/29/2014  . GERD (gastroesophageal reflux disease)   . Heart murmur   . History of blood transfusion "several"  . History of epistaxis   . HTN (hypertension), benign 03/02/2012  . Hyperlipidemia   . Iron deficiency anemia    chronic infusions"  . Lichen planus    Both lower extremities  . 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 Pneumonia 1990's X 2  . Seizures ([place]) 09/2014  . Telangiectasia    Gastric   . 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    Family History:  Family History  Problem Relation Age of Onset  . Stroke Father   . Breast cancer    . Malignant hyperthermia Neg Hx   . Seizures Neg Hx     Social History:  Social History   Social History  . Marital status: Married    Spouse name: [name]  . Number of children: 0  . Years of education: 9   Occupational History  . Retired Engineer, manufacturing systems Retired   Social History Main Topics  . Smoking status: Former Smoker    Packs/day: 1.00    Years: 20.00    Types: Cigarettes    Quit date: 02/10/1971  . Smokeless tobacco: Never Used     Comment: 09/15/2012 "smoked 50-60 yr ago"  . Alcohol use No  . Drug use: No  . Sexual activity: Not on file   Other Topics Concern  . Not on file   Social History Narrative   Married, lives with husband, [name].  Ambulates without assistance.     Caffeine use: none    Review of Systems: A complete ROS was negative except as per HPI.   Physical Exam: Blood pressure (!) 108/42, pulse 64, temperature 97.8 F (36.6 C), temperature source Oral, resp. rate 12, SpO2 100 %.  Irregularly-irregular rhythm on telemetry  General appearance: Elderly woman resting in stretcher bed, appears stated age, no apparent distress HENT: Normocephalic, atraumatic, moist mucous membranes, neck supple, nares packed inside with gauze Eyes: PERRL, non-icteric Cardiovascular: Irregular rhythm, systolic murmur appreciated Respiratory: CTAB, normal WOB Abdomen: BS+, soft NTND Skin: Warm, dry intact Neuro: Alert and oriented, cranial nerves grossly intact Psych: Appropriate affect  EKG: Sinus rhythm, posible RVH, no change from previous  Assessment & Plan by Problem:  Acute GI bleeding, acute on chronic 2/2 AVMs/epistaxis, large volume, coffee-ground emisis, resolved by admission, chronic anemia  requiring IV iron and transfusions, Hb 6.5 from 8.7  -- 2u pRBCs  -- GI aware, NPO for potential EGD  -- F/up post transfusion CBC  Hypotension, resolving SBP 70s now 100s s/p 1L, has hx CHF  -- resolving with IVF, pRBC  -- mIVF 50cc  Atrial fibrillation, (CHADS2VAS score 6, yearly stroke risk 9.7%) no documented history, present on exam, no RVR, poor candidate for any anticoagulation or blood thinners  -- Telemetry  -- Potential cards consult  Epistaxis  -- Afrin spray T2DM  -- Levemir 10U BID  -- CBG q6  FEN/GI: NPO, lytes as needed  DVTppx:  SCDs  Code status: full code  Dispo: Admit patient to Observation with expected length of stay less than 2 midnights.  Signed: Asencion Partridge, MD 09/28/2015, 5:56 AM  Pager: [phone]

## 2015-09-28 NOTE — Progress Notes (Signed)
Subjective: Today [name] denies any new vomiting blood, coffee ground vomit, notes or mucosal relief, or blood in stool.   Objective:  Vital signs in last 24 hours: Vitals:   09/28/15 1325 09/28/15 1406 09/28/15 1600 09/28/15 1718  BP: (!) 113/43 (!) 104/52 (!) 119/45 (!) 127/50  Pulse: (!) 55 (!) 54  62  Resp: 11 16    Temp: 97.8 F (36.6 C) 98 F (36.7 C) 98.6 F (37 C)   TempSrc:  Oral Oral   SpO2: 100% 100%    Weight:      Height:       Physical Exam  Constitutional: She is oriented to person, place, and time. She appears well-developed and well-nourished. No distress.  HENT:  Tongue- telengiectasias   Eyes: EOM are normal. Pupils are equal, round, and reactive to light.  Cardiovascular: Normal rate and regular rhythm.   No murmur heard. Pulmonary/Chest: Effort normal. No respiratory distress. She has no wheezes. She has no rales.  Abdominal: Soft. She exhibits no distension. There is no tenderness.  Neurological: She is alert and oriented to person, place, and time. She has normal strength. She displays normal reflexes. No cranial nerve deficit.  Skin: She is not diaphoretic.  Fingers- telengiectasias    Labs: CBC:  Recent Labs Lab 09/24/15 1030 09/28/15 0350 09/28/15 1206 09/28/15 1712  WBC 5.8 10.7*  --  9.5  NEUTROABS 4.5 7.8*  --   --   HGB 8.7* 6.5* 9.2* 9.4*  HCT 27.5* 20.5* 27.0* 29.5*  MCV 90.8 90.7  --  89.7  PLT 177 208  --  0000000   Metabolic Panel:  Recent Labs Lab 09/28/15 0349 09/28/15 0350 09/28/15 1206  NA  --  142 145  K  --  3.6 4.9  CL  --  109  --   CO2  --  29  --   GLUCOSE  --  263* 193*  BUN  --  16  --   CREATININE  --  1.53*  --   CALCIUM  --  8.8*  --   ALT  --  19  --   ALKPHOS  --  74  --   BILITOT  --  0.7  --   PROT  --  4.7*  --   ALBUMIN  --  2.6*  --   AMMONIA 157*  --   --   LABPROT  --  15.8*  --   INR  --  1.25  --    Imaging: EGD 8/19 Tiny hiatal hernia. - A single non-bleeding avm in the middle  third of the esophagus. Treated with argon plasma coagulation (APC). 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 Treated with argon plasma coagulation (APC). - A single non-bleeding angiodysplastic lesion in the duodenum. - Normal second portion of the duodenum. - An endoclip was found in the stomach. - Enlarged gastric folds. - The examination was otherwise normal. - No specimens collected.   Medications: NaCl infusion 30ml/hr  Lactated ringers 10 ml/hr  Ferrous Fumarate 106 mg Fe BID  Novolog 2 units TID  Levemir 10 units BID  Keppra 500 mg BID  Metoprolol succinate 12.5 mg qd  Pantoptazole 40 mg BID   Assessment/Plan: Pt is a 75 y.o. 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 Interventions at this time will be focused on hemodynamic stabilization.   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 Hemoglobin 6.5 on admission transfused 2 units of packed RBCs posttransfusion CBC found improvement of hemoglobin to 9.4. EGD performed today found multiple nonbleeding AVMs and angiodysplasia in the esophagus and stomach which were banded. GI is consulted we will follow their recommendations. Will continue IV fluids.  -follow up CBC   Seizure disorder continue home Keppra  Diabetes on Levemir and glipizide at home. Continue CBGs, Levemir and ISS Leukocytosis Low albumin  Low total protein   Dispo: Anticipated discharge in approximately 1-2 day(s).   LOS: 0 days   Ledell Noss, MD 09/28/2015, 6:31 PM Pager: [phone]

## 2015-09-28 NOTE — ED Triage Notes (Signed)
45 min- 1 hour pt belly was hurting and nausea. PTA pt was vomiting blood. Threw up several times in front of EMT. EMT stated clots and blood in vomit

## 2015-09-28 NOTE — Transfer of Care (Signed)
Immediate Anesthesia Transfer of Care Note  Patient: [name]  Procedure(s) Performed: Procedure(s): ESOPHAGOGASTRODUODENOSCOPY (EGD) (N/A) HOT HEMOSTASIS (ARGON PLASMA COAGULATION/BICAP) (N/A)  Patient Location: PACU  Anesthesia Type:General  Level of Consciousness: awake  Airway & Oxygen Therapy: Patient Spontanous Breathing and Patient connected to face mask oxygen  Post-op Assessment: Report given to RN and Post -op Vital signs reviewed and stable  Post vital signs: Reviewed and stable  Last Vitals:  Vitals:   09/28/15 1100 09/28/15 1150  BP: (!) 115/50 (!) 125/32  Pulse: (!) 55 (!) 55  Resp: 16 14  Temp:  36.7 C    Last Pain:  Vitals:   09/28/15 1150  TempSrc: Oral  PainSc:          Complications: No apparent anesthesia complications

## 2015-09-28 NOTE — ED Notes (Addendum)
Pt lying on stretcher, alert. Spouse w/pt. Pt states she feels sleepy and hungry. Denies any c/o pain. Aware has bed assigned. Monitor intact to pt.

## 2015-09-28 NOTE — ED Notes (Signed)
Gave patient a warm pack

## 2015-09-28 NOTE — Plan of Care (Signed)
Problem: Bowel/Gastric: Goal: Will show no signs and symptoms of gastrointestinal bleeding Outcome: Progressing No evidence of gastrointestinal bleeding at this time.

## 2015-09-28 NOTE — Anesthesia Procedure Notes (Signed)
Procedure Name: Intubation Date/Time: 09/28/2015 12:15 PM Performed by: Marinda Elk A Pre-anesthesia Checklist: Patient identified, Emergency Drugs available, Suction available, Patient being monitored and Timeout performed Patient Re-evaluated:Patient Re-evaluated prior to inductionOxygen Delivery Method: Circle System Utilized and Circle system utilized Preoxygenation: Pre-oxygenation with 100% oxygen Intubation Type: IV induction, Rapid sequence and Cricoid Pressure applied Laryngoscope Size: Mac and 3 Grade View: Grade II Tube type: Oral Tube size: 7.5 mm Number of attempts: 1 Airway Equipment and Method: Stylet Placement Confirmation: ETT inserted through vocal cords under direct vision,  positive ETCO2 and breath sounds checked- equal and bilateral Secured at: 22 cm Tube secured with: Tape Dental Injury: Teeth and Oropharynx as per pre-operative assessment

## 2015-09-28 NOTE — Consult Note (Signed)
Reason for Consult: Upper GI bleeding Referring Physician: ER physician  [name] is an 75 y.o. female.  HPI: Patient known to our service with history of AVMs who has not had any bleeding in almost a year and denies any aspirin or nonsteroidals but did notice black stools for a day or 2 and did vomit some blood this morning but has not been having any other GI symptoms and her hospital computer chart and her office computer chart was reviewed and her case discussed with her and her husband and her last endoscopy was reviewed  Past Medical History:  Diagnosis Date  . CHF (congestive heart failure) ([place])   . Chronic anemia   . Chronic diastolic CHF (congestive heart failure) ([facility]) 10/03/2013  . Chronic GI bleeding    [name] 11/29/2014  . Family history of anesthesia complication    "niece has a hard time coming out" (09/15/2012)  . Frequent nosebleeds    chronic  . Gastric AV malformation    [name] 11/29/2014  . GERD (gastroesophageal reflux disease)   . Heart murmur   . History of blood transfusion "several"  . History of epistaxis   . HTN (hypertension), benign 03/02/2012  . Hyperlipidemia   . Iron deficiency anemia    chronic infusions"  . Lichen planus    Both lower extremities  . 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 Pneumonia 1990's X 2  . Seizures ([place]) 09/2014  . Telangiectasia    Gastric   . 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    Past Surgical History:  Procedure Laterality Date  . CATARACT EXTRACTION     "I think it was just one eye"  . ESOPHAGOGASTRODUODENOSCOPY  02/26/2011   Procedure: ESOPHAGOGASTRODUODENOSCOPY (EGD);  Surgeon: [name], MD;  Location: [name];  Service: Endoscopy;  Laterality: N/A;  . ESOPHAGOGASTRODUODENOSCOPY N/A 11/08/2012   Procedure: ESOPHAGOGASTRODUODENOSCOPY (EGD);  Surgeon: [name], MD;  Location: [name];  Service: Endoscopy;  Laterality: N/A;  . ESOPHAGOGASTRODUODENOSCOPY  N/A 10/04/2013   Procedure: ESOPHAGOGASTRODUODENOSCOPY (EGD);  Surgeon: Winfield Cunas., MD;  Location: [name];  Service: Endoscopy;  Laterality: N/A;  with APC on stand-by  . ESOPHAGOGASTRODUODENOSCOPY N/A 07/06/2014   Procedure: ESOPHAGOGASTRODUODENOSCOPY (EGD);  Surgeon: Clarene Essex, MD;  Location: [name];  Service: Endoscopy;  Laterality: N/A;  . ESOPHAGOGASTRODUODENOSCOPY N/A 09/05/2014   Procedure: ESOPHAGOGASTRODUODENOSCOPY (EGD);  Surgeon: Laurence Spates, MD;  Location: [name];  Service: Endoscopy;  Laterality: N/A;  APC on standby to control bleeding  . ESOPHAGOGASTRODUODENOSCOPY N/A 11/29/2014   Procedure: ESOPHAGOGASTRODUODENOSCOPY (EGD);  Surgeon: [name], MD;  Location: [facility] ENDOSCOPY;  Service: Endoscopy;  Laterality: N/A;  . ESOPHAGOGASTRODUODENOSCOPY ENDOSCOPY  08/19/2006   with laser treatment  . HOT HEMOSTASIS N/A 07/06/2014   Procedure: HOT HEMOSTASIS (ARGON PLASMA COAGULATION/BICAP);  Surgeon: Clarene Essex, MD;  Location: [name];  Service: Endoscopy;  Laterality: N/A;  . NASAL HEMORRHAGE CONTROL     "for bleeding"   . SAVORY DILATION  02/26/2011   Procedure: SAVORY DILATION;  Surgeon: [name], MD;  Location: [name];  Service: Endoscopy;  Laterality: N/A;  c-arm needed    Family History  Problem Relation Age of Onset  . Stroke Father   . Breast cancer    . Malignant hyperthermia Neg Hx   . Seizures Neg Hx     Social History:  reports that she quit smoking about 44 years ago. Her smoking use included Cigarettes.  She has a 20.00 pack-year smoking history. She has never used smokeless tobacco. She reports that she does not drink alcohol or use drugs.  Allergies:  Allergies  Allergen Reactions  . Aspirin Nausea And Vomiting    Medications: I have reviewed the patient's current medications.  Results for orders placed or performed during the hospital encounter of 09/28/15 (from the past 48 hour(s))  Prepare RBC     Status: None    Collection Time: 09/28/15  3:30 AM  Result Value Ref Range   Order Confirmation ORDER PROCESSED BY BLOOD BANK   Type and screen     Status: None (Preliminary result)   Collection Time: 09/28/15  3:40 AM  Result Value Ref Range   ABO/RH(D) A POS    Antibody Screen NEG    Sample Expiration 10/01/2015    Unit Number [id number]    Blood Component Type RED CELLS,LR    Unit division 00    Status of Unit ISSUED    Transfusion Status OK TO TRANSFUSE    Crossmatch Result Compatible    Unit Number [id number]    Blood Component Type RED CELLS,LR    Unit division 00    Status of Unit ISSUED    Transfusion Status OK TO TRANSFUSE    Crossmatch Result Compatible   POC occult blood, ED     Status: Abnormal   Collection Time: 09/28/15  3:44 AM  Result Value Ref Range   Fecal Occult Bld POSITIVE (A) NEGATIVE  Ammonia     Status: Abnormal   Collection Time: 09/28/15  3:49 AM  Result Value Ref Range   Ammonia 157 (H) 9 - 35 umol/L  Comprehensive metabolic panel     Status: Abnormal   Collection Time: 09/28/15  3:50 AM  Result Value Ref Range   Sodium 142 135 - 145 mmol/L   Potassium 3.6 3.5 - 5.1 mmol/L   Chloride 109 101 - 111 mmol/L   CO2 29 22 - 32 mmol/L   Glucose, Bld 263 (H) 65 - 99 mg/dL   BUN 16 6 - 20 mg/dL   Creatinine, Ser 1.53 (H) 0.44 - 1.00 mg/dL   Calcium 8.8 (L) 8.9 - 10.3 mg/dL   Total Protein 4.7 (L) 6.5 - 8.1 g/dL   Albumin 2.6 (L) 3.5 - 5.0 g/dL   AST 27 15 - 41 U/L   ALT 19 14 - 54 U/L   Alkaline Phosphatase 74 38 - 126 U/L   Total Bilirubin 0.7 0.3 - 1.2 mg/dL   GFR calc non Af Amer 32 (L) >60 mL/min   GFR calc Af Amer 37 (L) >60 mL/min    Comment: (NOTE) The eGFR has been calculated using the CKD EPI equation. This calculation has not been validated in all clinical situations. eGFR's persistently <60 mL/min signify possible Chronic Kidney Disease.    Anion gap 4 (L) 5 - 15  CBC with Differential/Platelet     Status: Abnormal   Collection Time: 09/28/15   3:50 AM  Result Value Ref Range   WBC 10.7 (H) 4.0 - 10.5 K/uL   RBC 2.26 (L) 3.87 - 5.11 MIL/uL   Hemoglobin 6.5 (LL) 12.0 - 15.0 g/dL    Comment: REPEATED TO VERIFY CRITICAL RESULT CALLED TO, READ BACK BY AND VERIFIED WITH: CRICKETT RN AT 0421 ON 08.19.2017 BY COCHRANE S    HCT 20.5 (L) 36.0 - 46.0 %   MCV 90.7 78.0 - 100.0 fL   MCH 28.3 26.0 - 34.0 pg  MCHC 31.2 30.0 - 36.0 g/dL   RDW 16.3 (H) 11.5 - 15.5 %   Platelets 208 150 - 400 K/uL   Neutrophils Relative % 72 %   Neutro Abs 7.8 (H) 1.7 - 7.7 K/uL   Lymphocytes Relative 16 %   Lymphs Abs 1.7 0.7 - 4.0 K/uL   Monocytes Relative 7 %   Monocytes Absolute 0.8 0.1 - 1.0 K/uL   Eosinophils Relative 4 %   Eosinophils Absolute 0.4 0.0 - 0.7 K/uL   Basophils Relative 0 %   Basophils Absolute 0.0 0.0 - 0.1 K/uL  Protime-INR     Status: Abnormal   Collection Time: 09/28/15  3:50 AM  Result Value Ref Range   Prothrombin Time 15.8 (H) 11.4 - 15.2 seconds   INR 1.25   APTT     Status: None   Collection Time: 09/28/15  3:50 AM  Result Value Ref Range   aPTT 28 24 - 36 seconds    No results found.  ROS negative except above is a question of a seizure by EMS Blood pressure (!) 115/50, pulse (!) 55, temperature 98.1 F (36.7 C), resp. rate 16, SpO2 100 %. Physical Exam currently vital signs stable afebrile no acute distress exam please see preassessment evaluation specifically abdomen is soft nontender labs reviewed Assessment/Plan: Upper GI bleeding in a patient with known AVMs Plan: We rediscussed endoscopy and will proceed later today unfortunately she did drink a Goodness bit of orange juice so we'll have to wait till later and will proceed with anesthesia assistance  Kimyata Milich E 09/28/2015, 11:21 AM

## 2015-09-28 NOTE — Op Note (Signed)
[facility] Patient Name: [name] Procedure Date : 09/28/2015 MRN: [id number] Attending MD: Clarene Essex , MD Date of Birth: 12/17/1940 CSN: [id number] Age: 75 Admit Type: Emergency Department Procedure:                Upper GI endoscopy Indications:              Acute post hemorrhagic anemia, Hematemesis Providers:                Clarene Essex, MD, Kingsley Plan, RN, Ralene Bathe,                            Technician, Marinda Elk, CRNA Referring MD:              Medicines:                General Anesthesia Complications:            No immediate complications. Estimated Blood Loss:     Estimated blood loss: none. Procedure:                Pre-Anesthesia Assessment:                           - Prior to the procedure, a History and Physical                            was performed, and patient medications and                            allergies were reviewed. The patient's tolerance of                            previous anesthesia was also reviewed. The risks                            and benefits of the procedure and the sedation                            options and risks were discussed with the patient.                            All questions were answered, and informed consent                            was obtained. Prior Anticoagulants: The patient has                            taken no previous anticoagulant or antiplatelet                            agents. ASA Grade Assessment: II - A patient with                            mild systemic disease. After reviewing the risks  and benefits, the patient was deemed in                            satisfactory condition to undergo the procedure.                           After obtaining informed consent, the endoscope was                            passed under direct vision. Throughout the                            procedure, the patient's blood pressure, pulse, and          oxygen saturations were monitored continuously. The                            [id number]) scope was introduced through the                            mouth, and advanced to the second part of duodenum.                            The upper GI endoscopy was accomplished without                            difficulty. The patient tolerated the procedure                            well. Scope In: Scope Out: Findings:      A tiny hiatal hernia was present.      A single diminutive non-bleeding avm was found in the middle third of       the esophagus. Fulguration to ablate the lesion by argon plasma at 1       liter/minute and 20 watts was successful.      Multiple small no bleeding angiodysplastic lesions were found in the       cardia, on the greater curvature of the stomach, on the lesser curvature       of the stomach and in the gastric antrum. Fulguration to ablate the       lesion to prevent bleeding by argon plasma at 1 liters/minute and 20       watts was successful.      A single small angiodysplastic lesion without bleeding was found in the       duodenal bulb.      The second portion of the duodenum was normal.      An endoclip was found on the greater curvature of the stomach.      Prominent gastric folds were found in the gastric antrum.      The exam was otherwise without abnormality. Impression:               - Tiny hiatal hernia.                           - A single non-bleeding avm in the middle third of  the esophagus. Treated with argon plasma                            coagulation (APC).                           - Multiple non-bleeding angiodysplastic lesions in                            the stomach. Treated with argon plasma coagulation                            (APC).                           - A single non-bleeding angiodysplastic lesion in                            the duodenum.                           - Normal second  portion of the duodenum.                           - An endoclip was found in the stomach.                           - Enlarged gastric folds.                           - The examination was otherwise normal.                           - No specimens collected. Moderate Sedation:      moderate sedation-none Recommendation:           - Written discharge instructions were provided to                            the patient.                           - Clear liquid diet today.                           - Continue present medications.                           - Return to GI clinic PRN.                           - Telephone GI clinic if symptomatic PRN. Procedure Code(s):        --- Professional ---                           [phone], Esophagogastroduodenoscopy, flexible,  transoral; with ablation of tumor(s), polyp(s), or                            other lesion(s) (includes pre- and post-dilation                            and guide wire passage, when performed)                           43255, 69, Esophagogastroduodenoscopy, flexible,                            transoral; with control of bleeding, any method Diagnosis Code(s):        --- Professional ---                           K44.9, Diaphragmatic hernia without obstruction or                            gangrene                           K22.10, Ulcer of esophagus without bleeding                           K31.819, Angiodysplasia of stomach and duodenum                            without bleeding                           T18.2XXA, Foreign body in stomach, initial encounter                           K29.60, Other gastritis without bleeding                           D62, Acute posthemorrhagic anemia                           K92.0, Hematemesis CPT copyright 2016 [employer]. All rights reserved. The codes documented in this report are preliminary and upon coder review may  be revised to meet  current compliance requirements. Clarene Essex, MD 09/28/2015 12:59:55 PM This report has been signed electronically. Number of Addenda: 0

## 2015-09-28 NOTE — Anesthesia Preprocedure Evaluation (Signed)
Anesthesia Evaluation  Patient identified by MRN, date of birth, ID band Patient awake    Reviewed: Allergy & Precautions, NPO status , Patient's Chart, lab work & pertinent test results  History of Anesthesia Complications (+) Family history of anesthesia reaction  Airway Mallampati: II  TM Distance: >3 FB Neck ROM: Full    Dental no notable dental hx.    Pulmonary neg pulmonary ROS, pneumonia, former smoker,    Pulmonary exam normal breath sounds clear to auscultation       Cardiovascular hypertension, + Peripheral Vascular Disease and +CHF  negative cardio ROS Normal cardiovascular exam+ Valvular Problems/Murmurs  Rhythm:Regular Rate:Normal     Neuro/Psych Seizures -,  negative psych ROS   GI/Hepatic Neg liver ROS, GERD  ,  Endo/Other  negative endocrine ROSdiabetes  Renal/GU Renal InsufficiencyRenal disease  negative genitourinary   Musculoskeletal negative musculoskeletal ROS (+)   Abdominal   Peds negative pediatric ROS (+)  Hematology  (+) Blood dyscrasia, anemia ,   Anesthesia Other Findings   Reproductive/Obstetrics negative OB ROS                             Anesthesia Physical Anesthesia Plan  ASA: III and emergent  Anesthesia Plan: General   Post-op Pain Management:    Induction: Intravenous, Cricoid pressure planned and Rapid sequence  Airway Management Planned: Oral ETT  Additional Equipment:   Intra-op Plan:   Post-operative Plan: Extubation in OR  Informed Consent: I have reviewed the patients History and Physical, chart, labs and discussed the procedure including the risks, benefits and alternatives for the proposed anesthesia with the patient or authorized representative who has indicated his/her understanding and acceptance.   Dental advisory given  Plan Discussed with: CRNA  Anesthesia Plan Comments:         Anesthesia Quick Evaluation

## 2015-09-28 NOTE — Anesthesia Postprocedure Evaluation (Signed)
Anesthesia Post Note  Patient: [name]  Procedure(s) Performed: Procedure(s) (LRB): ESOPHAGOGASTRODUODENOSCOPY (EGD) (N/A) HOT HEMOSTASIS (ARGON PLASMA COAGULATION/BICAP) (N/A)  Patient location during evaluation: PACU Anesthesia Type: General Level of consciousness: sedated and patient cooperative Pain management: pain level controlled Vital Signs Assessment: post-procedure vital signs reviewed and stable Respiratory status: spontaneous breathing Cardiovascular status: stable Anesthetic complications: no    Last Vitals:  Vitals:   09/28/15 1255 09/28/15 1310  BP: (!) 120/48 (!) 116/43  Pulse: 60 (!) 56  Resp: 15 13  Temp: 36.8 C     Last Pain:  Vitals:   09/28/15 1150  TempSrc: Oral  PainSc:                  Nolon Nations

## 2015-09-29 ENCOUNTER — Encounter (HOSPITAL_COMMUNITY): Payer: Self-pay | Admitting: Gastroenterology

## 2015-09-29 DIAGNOSIS — K31811 Angiodysplasia of stomach and duodenum with bleeding: Secondary | ICD-10-CM | POA: Diagnosis not present

## 2015-09-29 DIAGNOSIS — K922 Gastrointestinal hemorrhage, unspecified: Secondary | ICD-10-CM | POA: Diagnosis not present

## 2015-09-29 DIAGNOSIS — I480 Paroxysmal atrial fibrillation: Secondary | ICD-10-CM | POA: Diagnosis present

## 2015-09-29 LAB — BASIC METABOLIC PANEL
Anion gap: 5 (ref 5–15)
BUN: 13 mg/dL (ref 6–20)
CALCIUM: 8.7 mg/dL — AB (ref 8.9–10.3)
CHLORIDE: 113 mmol/L — AB (ref 101–111)
CO2: 27 mmol/L (ref 22–32)
CREATININE: 1.16 mg/dL — AB (ref 0.44–1.00)
GFR calc Af Amer: 52 mL/min — ABNORMAL LOW (ref >60)
GFR calc non Af Amer: 45 mL/min — ABNORMAL LOW (ref >60)
Glucose, Bld: 70 mg/dL (ref 65–99)
Potassium: 3.9 mmol/L (ref 3.5–5.1)
SODIUM: 145 mmol/L (ref 135–145)

## 2015-09-29 LAB — TYPE AND SCREEN
ABO/RH(D): A POS
Antibody Screen: NEGATIVE
UNIT DIVISION: 0
Unit division: 0

## 2015-09-29 LAB — CBC WITH DIFFERENTIAL/PLATELET
BASOS PCT: 0 %
Basophils Absolute: 0 10*3/uL (ref 0.0–0.1)
EOS ABS: 0.4 10*3/uL (ref 0.0–0.7)
Eosinophils Relative: 5 %
HCT: 26.9 % — ABNORMAL LOW (ref 36.0–46.0)
HEMOGLOBIN: 8.5 g/dL — AB (ref 12.0–15.0)
LYMPHS ABS: 0.6 10*3/uL — AB (ref 0.7–4.0)
Lymphocytes Relative: 8 %
MCH: 28.5 pg (ref 26.0–34.0)
MCHC: 31.6 g/dL (ref 30.0–36.0)
MCV: 90.3 fL (ref 78.0–100.0)
MONO ABS: 0.6 10*3/uL (ref 0.1–1.0)
MONOS PCT: 8 %
NEUTROS PCT: 78 %
Neutro Abs: 6 10*3/uL (ref 1.7–7.7)
Platelets: 143 10*3/uL — ABNORMAL LOW (ref 150–400)
RBC: 2.98 MIL/uL — ABNORMAL LOW (ref 3.87–5.11)
RDW: 16.3 % — AB (ref 11.5–15.5)
WBC: 7.7 10*3/uL (ref 4.0–10.5)

## 2015-09-29 LAB — GLUCOSE, CAPILLARY
GLUCOSE-CAPILLARY: 48 mg/dL — AB (ref 65–99)
Glucose-Capillary: 161 mg/dL — ABNORMAL HIGH (ref 65–99)

## 2015-09-29 MED ORDER — HEPARIN SOD (PORK) LOCK FLUSH 100 UNIT/ML IV SOLN
500.0000 [IU] | INTRAVENOUS | Status: AC | PRN
  Administered 2015-09-29: 500 [IU]

## 2015-09-29 NOTE — Progress Notes (Signed)
[name] 8:28 AM  Subjective: Patient without any complaints and without any obvious post endoscopy problems and no signs of obvious bleeding and case discussed with her primary doctor and a few family members as well and patient wants to eat and go home  Objective: Vital signs stable afebrile no acute distress abdomen is soft nontender hemoglobin slight drop BUN okay  Assessment: AVM bleeding status post therapy  Plan: Will advance diet please let us know if we could be of any further assistance with this hospital stay and all of the patient's questions were answered  [facility]  Pager [phone] After 5PM or if no answer call [phone]

## 2015-09-29 NOTE — Progress Notes (Signed)
Pt discharged per w/c with all belongings. Family accompanied pt home via private car. Pt had all personal belongings and Med rec.

## 2015-09-29 NOTE — Discharge Summary (Signed)
Name: [name] MRN: [id number] DOB: 1940/03/15 75 y.o. PCP: [name], DO  Date of Admission: 09/28/2015  3:29 AM Date of Discharge: 09/29/2015 Attending Physician: Dr. Murriel Hopper Discharge Diagnosis: 1. Hereditary hemorrhagic telangiectasia  2. New onset A-fib   Principal Problem:   Acute blood loss anemia Active Problems:   Gastric AVM   HHT (hereditary hemorrhagic telangiectasia) ([name]   DM (diabetes mellitus), type 2, uncontrolled, with renal complications ([name]   Atrial fibrillation ([name]   Discharge Medications:   Medication List    STOP taking these medications   metoprolol succinate 25 MG 24 hr tablet Commonly known as:  TOPROL XL     TAKE these medications   AMITIZA 24 MCG capsule Generic drug:  lubiprostone Take 24 mcg by mouth 2 (two) times daily.   donepezil 10 MG tablet Commonly known as:  ARICEPT Take 1 tablet (10 mg total) by mouth at bedtime.   ferrous fumarate 325 (106 Fe) MG Tabs tablet Commonly known as:  HEMOCYTE - 106 mg FE Take 1 tablet by mouth 2 (two) times daily. Reported on 01/30/2015   furosemide 20 MG tablet Commonly known as:  LASIX Take 1 tablet (20 mg total) by mouth daily. Please call and schedule an appointment   glipiZIDE 5 MG tablet Commonly known as:  GLUCOTROL Take 0.5 tablets (2.5 mg total) by mouth daily before breakfast.   Insulin Detemir 100 UNIT/ML Pen Commonly known as:  LEVEMIR FLEXTOUCH Inject 20 Units into the skin 2 (two) times daily.   lactulose 10 GM/15ML solution Commonly known as:  CHRONULAC Take 15 mLs (10 g total) by mouth 2 (two) times daily.   levETIRAcetam 500 MG tablet Commonly known as:  KEPPRA Take 1 tablet (500 mg total) by mouth 2 (two) times daily.   memantine 5 MG tablet Commonly known as:  NAMENDA Take 1 tablet PO daily for 1 week then increase to 1 tablet  PO BID.   oxymetazoline 0.05 % nasal spray Commonly known as:  AFRIN Place 1 spray into both nostrils 2 (two)  times daily as needed (Epistaxis).   pantoprazole 40 MG tablet Commonly known as:  PROTONIX Take 1 tablet (40 mg total) by mouth 2 (two) times daily.   potassium chloride SA 20 MEQ tablet Commonly known as:  K-DUR,KLOR-CON Take 1 tablet (20 mEq total) by mouth daily.   traMADol 50 MG tablet Commonly known as:  ULTRAM Take 50 mg by mouth as needed for moderate pain.       Disposition and follow-up:   [name] was discharged from [facility] in Good condition.  At the hospital follow up visit please address:  1. Hereditary hemorrhagic telengectasias: screen for recent GI bleeds- upper and lower, recheck CBC  New onset afib - Recheck EKG. She was bradycardic on admission and her home blood pressure medication metoprolol was held, evaluate need for rate control at the time of follow up. Despite CHADSVASc >2, I would hesitate to start antiplatelet in the setting of her chronic bleeding issues.   2.  Labs / imaging needed at time of follow-up: CBC, EKG   3.  Pending labs/ test needing follow-up: none  Follow-up Appointments: Follow-up Information    [place]. Go in 1 week(s).   Contact information: 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 She awoke from sleep and began vomiting large volumes of coffee ground emesis. She got up to go to the bathroom and eventually fell to the floor (no loss of consciousness, no trauma). She endorses black tarry stools for 3-4 days and intermittent mild epistaxis (most recently tonight, but this is longstanding issue). She denies dizziness, chest pain, nausea, abdominal pain, palpitations, hematuria.  She is followed by Dr. Beryle [name] in the internal medicine clinic and has a history of recurring GI and nasal mucosal bleeding. She is monitored with blood counts every 2 weeks and has required blood transfusions and iron infusion on a regular basis.   En route she was hypotensive at 79/37, HR 50, CBG 272, experienced 4 second seizure per EMS.   In the ED had pressure 100/80, Hemoglobin 6.5 and FOBT positive. She was stabilized with IV fluids and transfused 2 units pRBC.   Acute GI bleed secondary to hereditary telengectasia with history of ongoing GI and mucosal bleeding and regular iron infusions and blood transfusions in our clinic. Hemoglobin 6.5 on admission transfused 2 units of packed RBCs posttransfusion CBC found improvement of hemoglobin to 9.4. EGD found multiple nonbleeding AVMs and angiodysplasia in the esophagus and stomach which were banded. Blood pressure remained stable following ED transfusions and she experienced no symptomatic complaints of acute bleeding.   New onset atrial fibrillation: Appears new onset, likely in the setting of acute blood loss. Her heart rate  trended 30s to 50s, but she denies any symptoms like dizziness, fatigue, weakness is reassuring. Given her underlying bleeding disorder, we were cautious about starting anticoagulation despite her CHADSVASC score of at least 2. Her home medication metoprolol succinate 12.5 mg daily was held for this admission and she was follow-up at which point her heart rate can be reassessed and risks and benefits can be  explored further.   Seizure disorder continued on home Keppra, this had to have experienced a seizure with EMS, no other seizures were witnessed during the hospitalization.   Diabetes on Levemir and glipizide at home. Monitored with CBGs, Levemir and ISS during this hospitalization.   Discharge Vitals:   BP (!) 142/62   Pulse (!) 53   Temp 97.8 F (36.6 C) (Oral)   Resp 17   Ht 5\' 3"  (1.6 m)   Wt 140 lb (63.5 kg)   SpO2 98%   BMI 24.80 kg/m   Pertinent Labs, Studies, and Procedures:  Upper endoscopy 09/28/15  A tiny hiatal hernia was present. A single diminutive non-bleeding avm was found in the middle third of the esophagus. Fulguration to ablate the lesion by argon plasma at 1 liter/minute and 20 watts was successful. Multiple small no bleeding angiodysplastic lesions were found in the cardia, on the greater curvature of the stomach, on the lesser curvature of the stomach and in the gastric antrum. Fulguration to ablate the lesion to prevent bleeding by argon plasma at 1 liters/minute and 20 watts was successful. A single small angiodysplastic lesion without  bleeding was found in the duodenal bulb. The second portion of the duodenum was normal. An endoclip was found on the greater curvature of the stomach. Prominent gastric folds were found in the gastric antrum.  Discharge Instructions: Discharge Instructions    Call MD for:  difficulty breathing, headache or visual disturbances    Complete by:  As directed   Call MD for:  persistant nausea and vomiting    Complete by:  As directed   Call MD for:  severe uncontrolled pain    Complete by:  As directed   Increase activity slowly    Complete by:  As directed      Signed: Ledell Noss, MD 09/29/2015, 5:10 PM   Pager: @MYPAGER @

## 2015-09-29 NOTE — Progress Notes (Signed)
Went over all d/c instructions with family and pt. Awaiting discharge

## 2015-09-29 NOTE — Discharge Instructions (Signed)
Thank you for trusting [place] with your medical care!  You were hospitalized for blood loss and treated with blood products.   Please take note of the following changes to your medications: -STOP metoprolol until you can be seen by a doctor to make sure your heart rate is not running too low  To make sure you are getting better, please make it to the follow-up appointments listed on the first page.  If you have any questions, please call [phone].

## 2015-09-29 NOTE — Progress Notes (Signed)
   Subjective: This morning, her daughter and her granddaughter at bedside. She does not report any additional hematemesis, abdominal pain, bleeding from any other place. She has tolerated breakfast this morning and is looking forward to getting out of the hospital today. I reviewed with her that she has been bradycardic since admission and that we will hold her metoprolol until she can follow-up in clinic to reassess her heart rate and whether or not she should be on this medication, especially given her atrial fibrillation.  Objective:  Vital signs in last 24 hours: Vitals:   09/29/15 0500 09/29/15 0600 09/29/15 0700 09/29/15 0758  BP: (!) 119/39 (!) 110/52 (!) 101/40 (!) 119/33  Pulse: (!) 53 (!) 32 (!) 27 (!) 53  Resp: 14 12 13 20   Temp:    98.5 F (36.9 C)  TempSrc:    Oral  SpO2: 93% 94% 94% 98%  Weight:      Height:       Physical Exam  Constitutional: She is oriented to person, place, and time. No distress.  HENT:  Head: Normocephalic and atraumatic.  Eyes: Conjunctivae are normal. No scleral icterus.  Cardiovascular:  Irregular rate and rhythm  Pulmonary/Chest: Effort normal. No respiratory distress.  Abdominal: Soft. Bowel sounds are normal. She exhibits no distension.  Neurological: She is alert and oriented to person, place, and time.  Skin: She is not diaphoretic.  Telangiectasias noted at the distal fingertips on both hands.     Assessment/Plan:  Principal Problem:   Acute blood loss anemia Active Problems:   Gastric AVM   HHT (hereditary hemorrhagic telangiectasia) ([name]   DM (diabetes mellitus), type 2, uncontrolled, with renal complications ([name]   Atrial fibrillation ([place])  Ms. Britts is a 75 year old female with chronic anemia secondary to hereditary hemorrhagic telangiectasia, hypertension, insulin-dependent type 2 diabetes, diastolic congestive heart failure hospitalized on 09/28/15 for acute blood loss anemia likely secondary to esophageal AVM and  multiple angiodysplatic lesions in the stomach and duodenum s/p argon plasma coagulation found to have new onset atrial fibrillation.  Acute blood loss anemia secondary to esophageal AVM and multiple angiodysplatic lesions in the stomach and duodenum s/p argon plasma coagulation: Hemoglobin 8.5 this morning, down from 9.8 yesterday. Blood pressure has remained stable overnight. No symptomatic complaints of acute bleeding which is reassuring. Stable for discharge from GI standpoint.  New onset atrial fibrillation: Appears new onset, likely in the setting of acute blood loss. Her heart rate has been trending 30s to 50s over the last 24 hours, but she denies any symptoms like dizziness, fatigue, weakness is reassuring. Given her underlying bleeding disorder, I would be cautious about starting anticoagulation despite her CHADSVASC score of at least 2. -Hold metoprolol succinate 12.5 mg daily until she can follow-up at which point her heart rate can be reassessed and risks and benefits can be explored further  Dispo: Anticipated discharge today.  Riccardo Dubin, MD 09/29/2015, 11:21 AM Pager: @MYPAGER @

## 2015-10-08 ENCOUNTER — Other Ambulatory Visit: Payer: Medicare Other

## 2015-10-08 ENCOUNTER — Ambulatory Visit (INDEPENDENT_AMBULATORY_CARE_PROVIDER_SITE_OTHER): Payer: Medicare Other | Admitting: Internal Medicine

## 2015-10-08 ENCOUNTER — Encounter: Payer: Self-pay | Admitting: Internal Medicine

## 2015-10-08 VITALS — BP 145/52 | HR 62 | Temp 98.7°F | Ht 63.0 in | Wt 148.2 lb

## 2015-10-08 DIAGNOSIS — I780 Hereditary hemorrhagic telangiectasia: Secondary | ICD-10-CM

## 2015-10-08 DIAGNOSIS — I480 Paroxysmal atrial fibrillation: Secondary | ICD-10-CM | POA: Diagnosis not present

## 2015-10-08 LAB — CBC WITH DIFFERENTIAL/PLATELET
BASOS ABS: 0 10*3/uL (ref 0.0–0.1)
BASOS PCT: 1 %
EOS ABS: 0.1 10*3/uL (ref 0.0–0.7)
Eosinophils Relative: 2 %
HEMATOCRIT: 28.6 % — AB (ref 36.0–46.0)
HEMOGLOBIN: 9 g/dL — AB (ref 12.0–15.0)
Lymphocytes Relative: 8 %
Lymphs Abs: 0.4 10*3/uL — ABNORMAL LOW (ref 0.7–4.0)
MCH: 29.7 pg (ref 26.0–34.0)
MCHC: 31.5 g/dL (ref 30.0–36.0)
MCV: 94.4 fL (ref 78.0–100.0)
Monocytes Absolute: 0.3 10*3/uL (ref 0.1–1.0)
Monocytes Relative: 6 %
NEUTROS ABS: 4.5 10*3/uL (ref 1.7–7.7)
NEUTROS PCT: 83 %
Platelets: 159 10*3/uL (ref 150–400)
RBC: 3.03 MIL/uL — ABNORMAL LOW (ref 3.87–5.11)
RDW: 16.9 % — AB (ref 11.5–15.5)
WBC: 5.4 10*3/uL (ref 4.0–10.5)

## 2015-10-08 LAB — SAMPLE TO BLOOD BANK

## 2015-10-08 NOTE — Assessment & Plan Note (Signed)
Patient was also noted to be in new onset atrial fibrillation, but bradycardic in the 30-50s. Her metoprolol was held. She was not started on anticoagulation or ASA despite her CHADS2-VASc score due to her recurrent GIB. Patient denies chest pain, shortness of breath or palpitations. HR is 62, regular rate and rhythm. Due to her persistent bradycardia while hospitalized despite severe anemia, patient may have a chronotropic issues which would prohibit the use of beta blockers. I would continue to hold beta blockers and avoid use of anticoagulation or antiplatelets in this patient.   Plan: -Continue to monitor -NO beta blockers or anticoagulation

## 2015-10-08 NOTE — Progress Notes (Signed)
Case discussed with Dr. Burns soon after the resident saw the patient. We reviewed the resident's history and exam and pertinent patient test results. I agree with the assessment, diagnosis, and plan of care documented in the resident's note. 

## 2015-10-08 NOTE — Progress Notes (Signed)
    CC: Hospital follow up for anemia  HPI: [name] is a 75 y.o. female with PMHx of Hereditary Hemorrhagic Telangiectasias, T2DM, Dementia, HTN, and PAF who presents to the clinic for follow up for hospitalization for acute on chronic anemia.  Patient was recently admitted for acute onset hematemesis resulting in hypotension and anemia from 8.7 to 6.5. Patient was transfused 2 units pRBCs and given an iron transfusion. EGD revealed numerous telangiectasias without active bleed that were laser coagulated. She returns today for follow up. She denies recurrent hematemesis, melena or hematochezia. She denies lightheadedness or shortness of breath. Hemoglobin is stable at 9.  Patient was also noted to be in new onset atrial fibrillation, but bradycardic in the 30-50s. Her metoprolol was held. She was not started on anticoagulation or ASA despite her CHADS2-VASc score due to her recurrent GIB. Patient denies chest pain, shortness of breath or palpitations. HR is 62, regular rate and rhythm.   Past Medical History:  Diagnosis Date  . CHF (congestive heart failure) ([place])   . Chronic anemia   . Chronic diastolic CHF (congestive heart failure) ([place]) 10/03/2013  . Chronic GI bleeding    [name] 11/29/2014  . Family history of anesthesia complication    "niece has a hard time coming out" (09/15/2012)  . Frequent nosebleeds    chronic  . Gastric AV malformation    [name] 11/29/2014  . GERD (gastroesophageal reflux disease)   . Heart murmur   . History of blood transfusion "several"  . History of epistaxis   . HTN (hypertension), benign 03/02/2012  . Hyperlipidemia   . Iron deficiency anemia    chronic infusions"  . Lichen planus    Both lower extremities  . 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 Pneumonia 1990's X 2  . Seizures ([place]) 09/2014  . Telangiectasia    Gastric   . 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   Review of Systems: Please see  pertinent ROS reviewed in HPI and problem based charting.   Physical Exam: Vitals:   10/08/15 1050  BP: (!) 145/52  Pulse: 62  Temp: 98.7 F (37.1 C)  TempSrc: Oral  SpO2: 100%  Weight: 148 lb 3.2 oz (67.2 kg)  Height: 5\' 3"  (1.6 m)   General: Vital signs reviewed.  Patient is elderly, in no acute distress and cooperative with exam.  Cardiovascular: RRR, 3/6 systolic murmur. Pulmonary/Chest: Clear to auscultation bilaterally, no wheezes, rales, or rhonchi. Abdominal: Soft, non-tender, non-distended, BS + Extremities: Trace lower extremity edema bilaterally  Assessment & Plan:  See encounters tab for problem based medical decision making. Patient discussed with Dr. Eppie Gibson

## 2015-10-08 NOTE — Assessment & Plan Note (Signed)
Patient was recently admitted for acute onset hematemesis resulting in hypotension and anemia from 8.7 to 6.5. Patient was transfused 2 units pRBCs and given an iron transfusion. EGD revealed numerous telangiectasias without active bleed that were laser coagulated. She returns today for follow up. She denies recurrent hematemesis, melena or hematochezia. She denies lightheadedness or shortness of breath. Hemoglobin is stable at 9.  Plan: -Continue iron supplementation BID -Follow up in 2 weeks for repeat CBC

## 2015-10-08 NOTE — Patient Instructions (Signed)
Continue all medications as prescribed.   We will order home physical therapy for you.   Follow up in 2 weeks with your primary care doctor and for a recheck of your blood levels.

## 2015-10-10 ENCOUNTER — Ambulatory Visit (INDEPENDENT_AMBULATORY_CARE_PROVIDER_SITE_OTHER): Payer: Medicare Other | Admitting: Dietician

## 2015-10-10 ENCOUNTER — Other Ambulatory Visit: Payer: Medicare Other

## 2015-10-10 DIAGNOSIS — Z794 Long term (current) use of insulin: Secondary | ICD-10-CM | POA: Diagnosis not present

## 2015-10-10 DIAGNOSIS — IMO0002 Reserved for concepts with insufficient information to code with codable children: Secondary | ICD-10-CM

## 2015-10-10 DIAGNOSIS — Z713 Dietary counseling and surveillance: Secondary | ICD-10-CM

## 2015-10-10 DIAGNOSIS — E1165 Type 2 diabetes mellitus with hyperglycemia: Secondary | ICD-10-CM

## 2015-10-10 DIAGNOSIS — E1129 Type 2 diabetes mellitus with other diabetic kidney complication: Secondary | ICD-10-CM

## 2015-10-10 LAB — GLUCOSE, CAPILLARY: Glucose-Capillary: 202 mg/dL — ABNORMAL HIGH (ref 65–99)

## 2015-10-10 NOTE — Patient Instructions (Addendum)
   You need protein to heal and make blood cells     Eat COTTAGE CHEESE for protein instead of meat  Eat FISH 3 times a week- baked fish is best  Boil a dozen EGGS each week, and eat for snacks as Hard Boiled EGG and cheese in salad, DEVILED EGGS, EGG SALAD  SOY MILK is a good drink and is high in protein  Tell your doctor if your blood sugar is less than 90 mg/dl.  Have your husband help you use the Levemir pen if you forget  Your weight and blood sugars are stable. Please follow up in about one year about next June.

## 2015-10-10 NOTE — Progress Notes (Signed)
  Medical Nutrition Therapy:  Appt start time: 1020 end time:  1100. Visit # 4- 3 month follow up  Assessment:  [name] was referred for better eating habits, weight stabilization/loss. Her husband brought her, but stayed in the waiting room. She has help on 2 days for 3 hours now and they are trying to get her more hours. She reports good appetite, no problems with bowels, bladder, swallowing or chewing softer foods.  She has been drinking ~ 1 Boost/Glucerna each day. She has difficulty using insulin pen today- needed help to put the needle on, find the dosing window and to complete the full injection. She says her husband helps her when she forgets  ANTHROPOMETRICS: weight-146.8#, increased 6# in 3 months, BMI -  Now 24-26 MEDICINE: does not know if she is taking the glipizide, reports she takes 20 units of insulin twice daily, using the insulin pen and likes it.  BLOOD SUGAR: A1C goal < 7.5%, current A1C 7.1%, she has anemia, so her A1C may not be an accurate reflection of her glucose control. CBG 202 after reported breakfast and 20 units levemir today. Her meter average is 158 which is acceptable, two hypoglycemic values of 61 and 65 in past month fasting. Other fasting values 94/108/123/115/81/98/101/276( wonder if she forgot PM insulin)/152/88/229/196/88, always higher in PM  DIETARY INTAKE: Usual eating pattern now 3 meals and 1-2 snack daily.   24-hr recall :  B ( 10 AM): 2 packs sweetened oatmeal (made with milk),  small cup coffee    L ( PM): sandwich- tomato, bologna or peanut butter or cheese, salad with ranch dressing,  ~ 12 oz juice D ( PM): says she is mostly sleeping during dinner, if she is awake she eats a sandwich Snacks: glucerna or there liquid supplement, peanut butter sandwich, fruit, grahams. Cookie. Sometimes ice cream Beverages: mostly water, juice, coffee  Usual physical activity: reads most of day, does not feel safe to walk by herself but would like to  walk  Progress Towards Goal(s):  Some progress.   Nutritional Diagnosis:  NI-1.4 Inadequate energy intake As related to lack of assistance, memory problems, lack of support, competing values and possibly finances resolved As evidenced by her report and weight stabilization for past 3-6 months.     Intervention:  Nutrition education about how to maximize nutrient dense foods,particularly non meat, easy to chew protein how to use an insulin pen Goals- have met goals of weight maintenance and adequate nutrition. Her blood sugars are more stable.  Coordination of care: She is still not safe to independently administer insulin. Needs supervision.  Consider decrease in levemir dose to 15 units in PM.   Teaching Method Utilized: Visual, Auditory,  Hands on Handouts given during visit include:AVS, signs and symptoms of low blood sugar and how to treat it Barriers to learning/adherence to lifestyle change: competing values- memory problems, both husband and wife seems to need caretakers Demonstrated degree of understanding via:  Teach Back   Monitoring/Evaluation:  Dietary intake, exercise, meter, and body weight in 8-10 months.

## 2015-10-22 ENCOUNTER — Encounter: Payer: Self-pay | Admitting: Internal Medicine

## 2015-10-22 ENCOUNTER — Other Ambulatory Visit: Payer: Medicare Other

## 2015-10-22 ENCOUNTER — Ambulatory Visit (INDEPENDENT_AMBULATORY_CARE_PROVIDER_SITE_OTHER): Payer: Medicare Other | Admitting: Internal Medicine

## 2015-10-22 VITALS — BP 118/34 | HR 59 | Temp 98.0°F | Ht 63.0 in | Wt 147.9 lb

## 2015-10-22 DIAGNOSIS — I780 Hereditary hemorrhagic telangiectasia: Secondary | ICD-10-CM | POA: Diagnosis not present

## 2015-10-22 DIAGNOSIS — K31819 Angiodysplasia of stomach and duodenum without bleeding: Secondary | ICD-10-CM

## 2015-10-22 DIAGNOSIS — M25511 Pain in right shoulder: Secondary | ICD-10-CM

## 2015-10-22 DIAGNOSIS — I480 Paroxysmal atrial fibrillation: Secondary | ICD-10-CM | POA: Diagnosis not present

## 2015-10-22 DIAGNOSIS — D649 Anemia, unspecified: Secondary | ICD-10-CM

## 2015-10-22 DIAGNOSIS — D62 Acute posthemorrhagic anemia: Secondary | ICD-10-CM

## 2015-10-22 LAB — CBC WITH DIFFERENTIAL/PLATELET
BASOS ABS: 0 10*3/uL (ref 0.0–0.1)
BASOS PCT: 0 %
EOS PCT: 8 %
Eosinophils Absolute: 0.4 10*3/uL (ref 0.0–0.7)
HCT: 26.1 % — ABNORMAL LOW (ref 36.0–46.0)
Hemoglobin: 8.1 g/dL — ABNORMAL LOW (ref 12.0–15.0)
LYMPHS PCT: 11 %
Lymphs Abs: 0.6 10*3/uL — ABNORMAL LOW (ref 0.7–4.0)
MCH: 29.1 pg (ref 26.0–34.0)
MCHC: 31 g/dL (ref 30.0–36.0)
MCV: 93.9 fL (ref 78.0–100.0)
Monocytes Absolute: 0.3 10*3/uL (ref 0.1–1.0)
Monocytes Relative: 6 %
Neutro Abs: 3.7 10*3/uL (ref 1.7–7.7)
Neutrophils Relative %: 75 %
PLATELETS: 192 10*3/uL (ref 150–400)
RBC: 2.78 MIL/uL — AB (ref 3.87–5.11)
RDW: 15.3 % (ref 11.5–15.5)
WBC: 4.9 10*3/uL (ref 4.0–10.5)

## 2015-10-22 LAB — SAMPLE TO BLOOD BANK

## 2015-10-22 MED ORDER — DICLOFENAC SODIUM 1 % TD GEL
2.0000 g | Freq: Four times a day (QID) | TRANSDERMAL | 1 refills | Status: DC
Start: 2015-10-22 — End: 2015-12-30

## 2015-10-22 NOTE — Assessment & Plan Note (Signed)
A: Patient is here to follow up as asked for a 2 week recheck of her CBC.  She was recently admitted last month for acute onset hematemesis resulting in hypotension and anemia.  EGD revealed numerous telangiectasias without active bleed that were laser coagulated. She returns today for follow up. She denies recurrent hematemesis, melena or hematochezia. She denies lightheadedness or shortness of breath.  Hemoglobin stable at 8.1.  P: - continue iron supplemenation BID - follow up as scheduled with Dr. [name]

## 2015-10-22 NOTE — Assessment & Plan Note (Signed)
A: HR today is high 50's to low 60's.  Rate and rhythm is regular.  She is not on [facility] or ASA due to bleeding risk.  Her BB is being held due to persistent bradycardia.  P: - continue monitoring right now - no BB or anticoagulation due to above.

## 2015-10-22 NOTE — Progress Notes (Signed)
CC: here for follow up of anemia and for right shoulder pain  HPI:  [name] is a 75 y.o. woman with a past medical history listed below here today for follow up of her anemia and for right shoulder pain.  For details of today's visit and the status of her chronic medical issues please refer to the assessment and plan.   Past Medical History:  Diagnosis Date  . CHF (congestive heart failure) ([place])   . Chronic anemia   . Chronic diastolic CHF (congestive heart failure) ([place]) 10/03/2013  . Chronic GI bleeding    [name] 11/29/2014  . Family history of anesthesia complication    "niece has a hard time coming out" (09/15/2012)  . Frequent nosebleeds    chronic  . Gastric AV malformation    [name] 11/29/2014  . GERD (gastroesophageal reflux disease)   . Heart murmur   . History of blood transfusion "several"  . History of epistaxis   . HTN (hypertension), benign 03/02/2012  . Hyperlipidemia   . Iron deficiency anemia    chronic infusions"  . Lichen planus    Both lower extremities  . 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 Pneumonia 1990's X 2  . Seizures ([place]) 09/2014  . Telangiectasia    Gastric   . 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    Review of Systems:   Please see pertinent ROS reviewed in HPI and problem based charting.  Physical Exam:  Vitals:   10/22/15 1121  BP: (!) 118/34  Pulse: (!) 59  Temp: 98 F (36.7 C)  TempSrc: Oral  SpO2: 100%  Weight: 147 lb 14.4 oz (67.1 kg)  Height: 5\' 3"  (1.6 m)   Physical Exam  Constitutional:  Pleasant, elderly, AA woman, no distress.  HENT:  Head: Normocephalic and atraumatic.  Cardiovascular:  3/6 systolic murmur, normal rate, regular rhythm.  Pulmonary/Chest: Effort normal and breath sounds normal.  Musculoskeletal: She exhibits no edema.  Right shoulder with decreased ROM limited by discomfort with flexion and internal rotation. Tender over her AC joint and posterior  shoulder girdle  Skin: Skin is warm and dry.  Psychiatric: Mood and affect normal.    Assessment & Plan:   See Encounters Tab for problem based charting.  Patient discussed with Dr. Eppie Gibson.  HHT (hereditary hemorrhagic telangiectasia) ([place]) A: Patient is here to follow up as asked for a 2 week recheck of her CBC.  She was recently admitted last month for acute onset hematemesis resulting in hypotension and anemia.  EGD revealed numerous telangiectasias without active bleed that were laser coagulated. She returns today for follow up. She denies recurrent hematemesis, melena or hematochezia. She denies lightheadedness or shortness of breath.  Hemoglobin stable at 8.1.  P: - continue iron supplemenation BID - follow up as scheduled with Dr. [name]  PAF (paroxysmal atrial fibrillation) ([place]) A: HR today is high 50's to low 60's.  Rate and rhythm is regular.  She is not on [facility] or ASA due to bleeding risk.  Her BB is being held due to persistent bradycardia.  P: - continue monitoring right now - no BB or anticoagulation due to above.   Pain in joint of right shoulder A: Patient reports 2 month history of right shoulder pain that is aggravated by activity.  Pain is constant.  No precipitating falls or known injury.  Pain elicited with ROM in flexion and internal rotation of her shoulder.  She  also has tenderness to palpation over her AC joint and posterior shoulder girdle.  No rashes, warmth or erythema.  She received topical treatment in the hospital that was helpful.  P: - will attempt trial of topical voltaren gel and physical therapy evaluation and treatment - consider imaging or diagnostic shoulder injection if not better with conservative treatment. - would avoid oral NSAIDs given her propensity for GI bleed.

## 2015-10-22 NOTE — Progress Notes (Signed)
Case discussed with Dr. Juleen China soon after the resident saw the patient. We reviewed the resident's history and exam and pertinent patient test results. I agree with the assessment, diagnosis, and plan of care documented in the resident's note.

## 2015-10-22 NOTE — Assessment & Plan Note (Signed)
A: Patient reports 2 month history of right shoulder pain that is aggravated by activity.  Pain is constant.  No precipitating falls or known injury.  Pain elicited with ROM in flexion and internal rotation of her shoulder.  She also has tenderness to palpation over her AC joint and posterior shoulder girdle.  No rashes, warmth or erythema.  She received topical treatment in the hospital that was helpful.  P: - will attempt trial of topical voltaren gel and physical therapy evaluation and treatment - consider imaging or diagnostic shoulder injection if not better with conservative treatment. - would avoid oral NSAIDs given her propensity for GI bleed.

## 2015-10-22 NOTE — Patient Instructions (Signed)
Thank you for coming to see me today. It was a pleasure. Today we talked about:   Shoulder Pain: - I will refer you to physical therapy and have prescribed Voltaren gel.  I will message our Social Worker about your need for increased services.  Please follow-up with me in 3 months or sooner if your shoulder pain is not better.  If you have any questions or concerns, please do not hesitate to call the office at [phone].  Take Care,   [name], DO

## 2015-10-23 ENCOUNTER — Other Ambulatory Visit: Payer: Self-pay | Admitting: Oncology

## 2015-10-23 ENCOUNTER — Telehealth: Payer: Self-pay | Admitting: *Deleted

## 2015-10-23 DIAGNOSIS — K31811 Angiodysplasia of stomach and duodenum with bleeding: Secondary | ICD-10-CM

## 2015-10-23 DIAGNOSIS — I780 Hereditary hemorrhagic telangiectasia: Secondary | ICD-10-CM

## 2015-10-23 NOTE — Telephone Encounter (Signed)
Talked to Dr [name] - pt's hgb 8.1; need to transfuse pt.  Call from [place], [facility] Short Stay had a cancellation at the last minute; appt schedule for tomorrow morning @ 0800 AM. Pt called. Also Linus Orn called main lab to see if TXM done yesterday still within 24hr period - which it is and can be used.

## 2015-10-24 ENCOUNTER — Encounter (HOSPITAL_COMMUNITY)
Admission: RE | Admit: 2015-10-24 | Discharge: 2015-10-24 | Disposition: A | Discharge status: Home / Self Care | Payer: Medicare Other | Source: Ambulatory Visit | Attending: Oncology | Admitting: Oncology

## 2015-10-24 ENCOUNTER — Other Ambulatory Visit: Payer: Medicare Other

## 2015-10-24 DIAGNOSIS — I780 Hereditary hemorrhagic telangiectasia: Secondary | ICD-10-CM | POA: Diagnosis present

## 2015-10-24 DIAGNOSIS — K31811 Angiodysplasia of stomach and duodenum with bleeding: Secondary | ICD-10-CM

## 2015-10-24 LAB — PREPARE RBC (CROSSMATCH)

## 2015-10-24 MED ORDER — HEPARIN SOD (PORK) LOCK FLUSH 100 UNIT/ML IV SOLN
INTRAVENOUS | Status: AC
  Filled 2015-10-24: qty 5

## 2015-10-24 MED ORDER — SODIUM CHLORIDE 0.9 % IV SOLN: Freq: Once | INTRAVENOUS | Status: DC

## 2015-10-24 MED ORDER — SODIUM CHLORIDE 0.9 % IV SOLN
Freq: Once | INTRAVENOUS | Status: AC
  Administered 2015-10-24: 12:00:00 via INTRAVENOUS

## 2015-10-24 MED ORDER — HEPARIN SOD (PORK) LOCK FLUSH 100 UNIT/ML IV SOLN
500.0000 [IU] | Freq: Once | INTRAVENOUS | Status: AC
  Administered 2015-10-24: 500 [IU] via INTRAVENOUS

## 2015-10-25 LAB — TYPE AND SCREEN
ABO/RH(D): A POS
Antibody Screen: NEGATIVE
UNIT DIVISION: 0
UNIT DIVISION: 0

## 2015-10-29 ENCOUNTER — Ambulatory Visit: Payer: Medicare Other | Attending: Internal Medicine | Admitting: Physical Therapy

## 2015-10-29 ENCOUNTER — Encounter: Payer: Self-pay | Admitting: Physical Therapy

## 2015-10-29 DIAGNOSIS — M25611 Stiffness of right shoulder, not elsewhere classified: Secondary | ICD-10-CM | POA: Diagnosis present

## 2015-10-29 DIAGNOSIS — M25511 Pain in right shoulder: Secondary | ICD-10-CM | POA: Diagnosis not present

## 2015-10-29 NOTE — Therapy (Signed)
[place], [place], [address] Phone: [phone]   Fax:  [phone]  Physical Therapy Evaluation  Patient Details  Name: [name] MRN: [id number] Date of Birth: [birth year] Referring Provider: [name], DO  Encounter Date: 10/29/2015      PT End of Session - 10/29/15 1211    Visit Number 1   Number of Visits 16   Date for PT Re-Evaluation 12/29/15   Authorization Type KX modifier after 15 visit, G -codes   PT Start Time 1150   PT Stop Time 1240   PT Time Calculation (min) 50 min   Activity Tolerance Patient tolerated treatment well   Behavior During Therapy [facility] for tasks assessed/performed      Past Medical History:  Diagnosis Date  . CHF (congestive heart failure) ([place])   . Chronic anemia   . Chronic diastolic CHF (congestive heart failure) ([place]) 10/03/2013  . Chronic GI bleeding    [name] 11/29/2014  . Family history of anesthesia complication    "niece has a hard time coming out" (09/15/2012)  . Frequent nosebleeds    chronic  . Gastric AV malformation    [name] 11/29/2014  . GERD (gastroesophageal reflux disease)   . Heart murmur   . History of blood transfusion "several"  . History of epistaxis   . HTN (hypertension), benign 03/02/2012  . Hyperlipidemia   . Iron deficiency anemia    chronic infusions"  . Lichen planus    Both lower extremities  . 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 Pneumonia 1990's X 2  . Seizures ([place]) 09/2014  . Telangiectasia    Gastric   . 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    Past Surgical History:  Procedure Laterality Date  . CATARACT EXTRACTION     "I think it was just one eye"  . ESOPHAGOGASTRODUODENOSCOPY  02/26/2011   Procedure: ESOPHAGOGASTRODUODENOSCOPY (EGD);  Surgeon: [name], MD;  Location: [name];  Service: Endoscopy;  Laterality: N/A;  . ESOPHAGOGASTRODUODENOSCOPY N/A 11/08/2012   Procedure: ESOPHAGOGASTRODUODENOSCOPY (EGD);  Surgeon: [name], MD;  Location: [name];  Service: Endoscopy;  Laterality: N/A;  . ESOPHAGOGASTRODUODENOSCOPY N/A 10/04/2013   Procedure: ESOPHAGOGASTRODUODENOSCOPY (EGD);  Surgeon: Winfield Cunas., MD;  Location: [name];  Service: Endoscopy;  Laterality: N/A;  with APC on stand-by  . ESOPHAGOGASTRODUODENOSCOPY N/A 07/06/2014   Procedure: ESOPHAGOGASTRODUODENOSCOPY (EGD);  Surgeon: Clarene Essex, MD;  Location: [name];  Service: Endoscopy;  Laterality: N/A;  . ESOPHAGOGASTRODUODENOSCOPY N/A 09/05/2014   Procedure: ESOPHAGOGASTRODUODENOSCOPY (EGD);  Surgeon: Laurence Spates, MD;  Location: [name];  Service: Endoscopy;  Laterality: N/A;  APC on standby to control bleeding  . ESOPHAGOGASTRODUODENOSCOPY N/A 11/29/2014   Procedure: ESOPHAGOGASTRODUODENOSCOPY (EGD);  Surgeon: [name], MD;  Location: [facility] ENDOSCOPY;  Service: Endoscopy;  Laterality: N/A;  . ESOPHAGOGASTRODUODENOSCOPY N/A 09/28/2015   Procedure: ESOPHAGOGASTRODUODENOSCOPY (EGD);  Surgeon: Clarene Essex, MD;  Location: [facility] ENDOSCOPY;  Service: Endoscopy;  Laterality: N/A;  . ESOPHAGOGASTRODUODENOSCOPY ENDOSCOPY  08/19/2006   with laser treatment  . HOT HEMOSTASIS N/A 07/06/2014   Procedure: HOT HEMOSTASIS (ARGON PLASMA COAGULATION/BICAP);  Surgeon: Clarene Essex, MD;  Location: [name];  Service: Endoscopy;  Laterality: N/A;  . HOT HEMOSTASIS N/A 09/28/2015   Procedure: HOT HEMOSTASIS (ARGON PLASMA COAGULATION/BICAP);  Surgeon: Clarene Essex, MD;  Location: [facility] ENDOSCOPY;  Service: Endoscopy;  Laterality: N/A;  . NASAL HEMORRHAGE CONTROL     "for bleeding"   .  SAVORY DILATION  02/26/2011   Procedure: SAVORY DILATION;  Surgeon: [name], MD;  Location: [name];  Service: Endoscopy;  Laterality: N/A;  c-arm needed    There were no vitals filed for this visit.       Subjective Assessment - 10/29/15 1159    Subjective Pt reporting pain in R shoulder of  5/10 at rest. Pt also reporting pain in bilateral legs of 3/10. Pt reporting stretching her legs help with her pain. Pt reporting her shoulder pain began about 2 months ago.    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 unlimited   How long can you stand comfortably? 10 minutes   How long can you walk comfortably?  5 minutes   Patient Stated Goals Stop hurting, be able to cook and clean for my husband,    Currently in Pain? Yes   Pain Score 5    Pain Location Shoulder   Pain Orientation Right   Pain Descriptors / Indicators Aching   Pain Type Chronic pain   Pain Onset More than a month ago   Pain Frequency Intermittent   Aggravating Factors  lifting, lying on R side   Pain Relieving Factors changing positions, heat   Effect of Pain on Daily Activities unable to cook and clean my house   Multiple Pain Sites Yes   Pain Score 3   Pain Location Leg   Pain Orientation Right;Left   Pain Descriptors / Indicators Aching   Pain Type Chronic pain   Pain Onset More than a month ago   Pain Frequency Intermittent            [facility] PT Assessment - 10/29/15 0001      Assessment   Medical Diagnosis R shoulder pain   Referring Provider [name], DO   Onset Date/Surgical Date 08/28/15   Hand Dominance Right   Prior Therapy yes, years ago     Balance Screen   Has the patient fallen in the past 6 months No   Is the patient reluctant to leave their home because of a fear of falling?  Yes     [place] Private residence   Living Arrangements Spouse/significant other   Type of [place] Access Level entry   [place] - single point     Prior Function   Level of Independence Needs assistance with ADLs;Needs assistance with homemaking   Vocation Retired    Leisure cooking     Cognition   Overall Cognitive Status Within Functional Limits for tasks assessed     Lawyer Test intact     Posture/Postural Control   Posture/Postural Control Postural limitations   Postural Limitations Rounded Shoulders;Forward head     ROM / Strength   AROM / PROM / Strength AROM;Strength     AROM   AROM Assessment Site Shoulder   Right/Left Shoulder Right   Right Shoulder ABduction 100 Degrees   Right Shoulder Internal Rotation 40 Degrees  elbow at 90 degrees   Right Shoulder External Rotation 30 Degrees  elbow at 90 degrees   Right Shoulder Horizontal ABduction 100 Degrees     Strength   Strength Assessment Site Shoulder   Right/Left Shoulder Right   Right Shoulder Flexion 4-/5   Right  Shoulder Extension 3+/5   Right Shoulder ABduction 4-/5   Right Shoulder Internal Rotation 4-/5   Right Shoulder External Rotation 4-/5   Right Shoulder Horizontal ABduction 4-/5   Right Shoulder Horizontal ADduction 4-/5     Special Tests    Special Tests Rotator Cuff Impingement   Rotator Cuff Impingment tests Empty Can test;Full Can test     Empty Can test   Findings Negative   Side Right     Full Can test   Findings Negative   Side Right     Ambulation/Gait   Ambulation/Gait Yes   Ambulation/Gait Assistance 7: Independent   Ambulation Distance (Feet) 50 Feet   Gait Pattern Step-through pattern;Decreased arm swing - right;Poor foot clearance - left;Poor foot clearance - right                           PT Education - 10/29/15 1251    Education provided Yes   Education Details HEP, posture correction   Person(s) Educated Patient   Methods Explanation;Demonstration;Verbal cues;Tactile cues;Handout   Comprehension Verbal cues required;Returned demonstration;Verbalized understanding;Tactile cues required          PT Short Term Goals - 10/29/15 1312      PT SHORT TERM GOAL #1   Title Pt will be  independent with her HEP in order to improve functional mobility.    Baseline Issued on 10/29/15   Time 4   Period Weeks   Status New     PT SHORT TERM GOAL #2   Title Pt will be able to demonstrate bilateral arm swing with gait.    Baseline decreased arm swing on the R   Time 4   Period Weeks   Status New           PT Long Term Goals - 10/29/15 1317      PT LONG TERM GOAL #1   Title pt will improve her FOTO score from 38% limitation to </= 35% limitation   Baseline 38% limitation   Time 8   Period Weeks     PT LONG TERM GOAL #2   Title Further assess pt's bilateral LE pain   Time 8   Period Weeks   Status New     PT LONG TERM GOAL #3   Title Pt will improve R shoulder strength to >/=4/5 in order to improve functional mobiltiy, reaching, and lifting.    Time 8   Period Weeks   Status New               Plan - 10/29/15 1253    Clinical Impression Statement Pt presenting with right shoulder pain of 4-5/10 and bilateral leg pain of 3/10. Pt with antalgic gait with decrease foot clearence. Pt with shoulder hiking on the R with tightness noted in her Upper Trap. Skilled Physical Therapy to needed to address pt's R shoulder pain and further assess bilateral LE pain.    Rehab Potential Good   Clinical Impairments Affecting Rehab Potential hereditary hemorrhagic telangiectasia (HHT), Port-a-Cath in R pectoralis. DM   PT Frequency 2x / week   PT Duration 8 weeks   PT Treatment/Interventions ADLs/Self Care Home Management;Functional mobility training;Gait training;Therapeutic activities;Therapeutic exercise;Patient/family education;Passive range of motion;Dry needling;Energy conservation;Taping;Manual techniques;Cryotherapy;Moist Heat   PT Next Visit Plan No ice or heat over port. Assess for scoliosis, Shoulder ROM, strengthening, gait with arm swing    PT Home Exercise Plan Supine Cane exercises and seated/standing rows,  issued red theraband   Consulted and Agree with  Plan of Care Patient      Patient will benefit from skilled therapeutic intervention in order to improve the following deficits and impairments:  Abnormal gait, Difficulty walking, Impaired UE functional use, Pain, Postural dysfunction, Decreased strength, Decreased mobility, Impaired flexibility, Improper body mechanics, Decreased activity tolerance, Decreased balance, Impaired perceived functional ability  Visit Diagnosis: Pain in right shoulder  Stiffness of right shoulder, not elsewhere classified      G-Codes - 31-Oct-2015 1321    Functional Assessment Tool Used FOTO, clinical judgement   Functional Limitation Carrying, moving and handling objects   Carrying, Moving and Handling Objects Current Status (T2458) At least 20 percent but less than 40 percent impaired, limited or restricted   Carrying, Moving and Handling Objects Goal Status (K9983) At least 20 percent but less than 40 percent impaired, limited or restricted       Problem List Patient Active Problem List   Diagnosis Date Noted  . Pain in joint of right shoulder 10/22/2015  . PAF (paroxysmal atrial fibrillation) ([place]) 09/29/2015  . Lichen planus 38/25/0539  . Healthcare maintenance 06/20/2015  . Seborrheic keratosis 06/20/2015  . Memory loss 01/30/2015  . Simple febrile convulsions ([place]) 10/21/2014  . Hyperammonemia ([place]) 10/03/2014  . Chronic kidney disease, stage III (moderate)   . Chronic diastolic CHF (congestive heart failure) ([place]) 10/03/2013  . DM (diabetes mellitus), type 2, uncontrolled, with renal complications ([place]) 76/73/4193  . HTN (hypertension), benign 03/02/2012  . 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

## 2015-10-30 ENCOUNTER — Ambulatory Visit: Payer: Medicare Other | Admitting: Physical Therapy

## 2015-11-05 ENCOUNTER — Ambulatory Visit: Payer: Medicare Other | Admitting: Physical Therapy

## 2015-11-05 ENCOUNTER — Other Ambulatory Visit (INDEPENDENT_AMBULATORY_CARE_PROVIDER_SITE_OTHER): Payer: Medicare Other

## 2015-11-05 DIAGNOSIS — I780 Hereditary hemorrhagic telangiectasia: Secondary | ICD-10-CM | POA: Diagnosis not present

## 2015-11-05 DIAGNOSIS — M25511 Pain in right shoulder: Secondary | ICD-10-CM

## 2015-11-05 DIAGNOSIS — D62 Acute posthemorrhagic anemia: Secondary | ICD-10-CM

## 2015-11-05 DIAGNOSIS — M25611 Stiffness of right shoulder, not elsewhere classified: Secondary | ICD-10-CM

## 2015-11-05 DIAGNOSIS — K31819 Angiodysplasia of stomach and duodenum without bleeding: Secondary | ICD-10-CM

## 2015-11-05 DIAGNOSIS — D649 Anemia, unspecified: Secondary | ICD-10-CM

## 2015-11-05 LAB — CBC WITH DIFFERENTIAL/PLATELET
BASOS ABS: 0 10*3/uL (ref 0.0–0.1)
BASOS PCT: 1 %
EOS PCT: 8 %
Eosinophils Absolute: 0.4 10*3/uL (ref 0.0–0.7)
HCT: 29.7 % — ABNORMAL LOW (ref 36.0–46.0)
Hemoglobin: 9.8 g/dL — ABNORMAL LOW (ref 12.0–15.0)
Lymphocytes Relative: 13 %
Lymphs Abs: 0.7 10*3/uL (ref 0.7–4.0)
MCH: 29.3 pg (ref 26.0–34.0)
MCHC: 33 g/dL (ref 30.0–36.0)
MCV: 88.9 fL (ref 78.0–100.0)
MONO ABS: 0.4 10*3/uL (ref 0.1–1.0)
MONOS PCT: 7 %
Neutro Abs: 3.7 10*3/uL (ref 1.7–7.7)
Neutrophils Relative %: 71 %
PLATELETS: 197 10*3/uL (ref 150–400)
RBC: 3.34 MIL/uL — ABNORMAL LOW (ref 3.87–5.11)
RDW: 14.8 % (ref 11.5–15.5)
WBC: 5.1 10*3/uL (ref 4.0–10.5)

## 2015-11-05 LAB — SAMPLE TO BLOOD BANK

## 2015-11-05 NOTE — Therapy (Signed)
[name], [place], [address] Phone: [phone]   Fax:  [phone]  Physical Therapy Treatment  Patient Details  Name: [name] MRN: [id number] Date of Birth: May 24, 1940 Referring Provider: [name], DO  Encounter Date: 11/05/2015      PT End of Session - 11/05/15 0949    Visit Number 2   Number of Visits 16   Date for PT Re-Evaluation 12/29/15   PT Start Time 0850   PT Stop Time 0933   PT Time Calculation (min) 43 min   Activity Tolerance Patient tolerated treatment well   Behavior During Therapy [facility] for tasks assessed/performed      Past Medical History:  Diagnosis Date  . CHF (congestive heart failure) ([place])   . Chronic anemia   . Chronic diastolic CHF (congestive heart failure) ([place]) 10/03/2013  . Chronic GI bleeding    [name] 11/29/2014  . Family history of anesthesia complication    "niece has a hard time coming out" (09/15/2012)  . Frequent nosebleeds    chronic  . Gastric AV malformation    [name] 11/29/2014  . GERD (gastroesophageal reflux disease)   . Heart murmur   . History of blood transfusion "several"  . History of epistaxis   . HTN (hypertension), benign 03/02/2012  . Hyperlipidemia   . Iron deficiency anemia    chronic infusions"  . Lichen planus    Both lower extremities  . 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 Pneumonia 1990's X 2  . Seizures ([name]) 09/2014  . Telangiectasia    Gastric   . 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    Past Surgical History:  Procedure Laterality Date  . CATARACT EXTRACTION     "I think it was just one eye"  . ESOPHAGOGASTRODUODENOSCOPY  02/26/2011   Procedure: ESOPHAGOGASTRODUODENOSCOPY (EGD);  Surgeon: [name], MD;  Location: [name];  Service: Endoscopy;  Laterality: N/A;  . ESOPHAGOGASTRODUODENOSCOPY N/A 11/08/2012   Procedure: ESOPHAGOGASTRODUODENOSCOPY (EGD);  Surgeon: [name], MD;  Location: [name];  Service: Endoscopy;  Laterality: N/A;  . ESOPHAGOGASTRODUODENOSCOPY N/A 10/04/2013   Procedure: ESOPHAGOGASTRODUODENOSCOPY (EGD);  Surgeon: Winfield Cunas., MD;  Location: [name];  Service: Endoscopy;  Laterality: N/A;  with APC on stand-by  . ESOPHAGOGASTRODUODENOSCOPY N/A 07/06/2014   Procedure: ESOPHAGOGASTRODUODENOSCOPY (EGD);  Surgeon: Clarene Essex, MD;  Location: [name];  Service: Endoscopy;  Laterality: N/A;  . ESOPHAGOGASTRODUODENOSCOPY N/A 09/05/2014   Procedure: ESOPHAGOGASTRODUODENOSCOPY (EGD);  Surgeon: Laurence Spates, MD;  Location: [name];  Service: Endoscopy;  Laterality: N/A;  APC on standby to control bleeding  . ESOPHAGOGASTRODUODENOSCOPY N/A 11/29/2014   Procedure: ESOPHAGOGASTRODUODENOSCOPY (EGD);  Surgeon: [name], MD;  Location[facility] ENDOSCOPY;  Service: Endoscopy;  Laterality: N/A;  . ESOPHAGOGASTRODUODENOSCOPY N/A 09/28/2015   Procedure: ESOPHAGOGASTRODUODENOSCOPY (EGD);  Surgeon: Clarene Essex, MD;  Location: [facility] ENDOSCOPY;  Service: Endoscopy;  Laterality: N/A;  . ESOPHAGOGASTRODUODENOSCOPY ENDOSCOPY  08/19/2006   with laser treatment  . HOT HEMOSTASIS N/A 07/06/2014   Procedure: HOT HEMOSTASIS (ARGON PLASMA COAGULATION/BICAP);  Surgeon: Clarene Essex, MD;  Location: [name];  Service: Endoscopy;  Laterality: N/A;  . HOT HEMOSTASIS N/A 09/28/2015   Procedure: HOT HEMOSTASIS (ARGON PLASMA COAGULATION/BICAP);  Surgeon: Clarene Essex, MD;  Location: [facility] ENDOSCOPY;  Service: Endoscopy;  Laterality: N/A;  . NASAL HEMORRHAGE CONTROL     "for bleeding"   . SAVORY DILATION  02/26/2011   Procedure: SAVORY DILATION;  Surgeon: [name], MD;  Location: [name];  Service: Endoscopy;  Laterality: N/A;  c-arm needed    There were no vitals filed for this visit.      Subjective Assessment - 11/05/15 0857    Subjective Ws[place]d to get home health            [facility] PT Assessment - 11/05/15 0001      AROM   Overall  AROM  --  flexion 155 supine Right   Right Shoulder ABduction 34 Degrees  After manual, supine   Right Shoulder External Rotation 52 Degrees  supine, neutral 52,  shoulder at 90,85 degrees painful end                     [facility] Adult PT Treatment/Exercise - 11/05/15 0001      Shoulder Exercises: Supine   Horizontal ABduction 10 reps  cane, supine   External Rotation 10 reps   External Rotation Limitations cane   Flexion 10 reps  cane   Flexion Limitations AA initially   ABduction 10 reps   ABduction Limitations AA for technique   Other Supine Exercises cane series 10 X  chest press, Horizontal add/ abd     Manual Therapy   Manual therapy comments PROM  Rt shoulder 10 x each,  soft tissue work upper trap wit triggerpoint release.   Upper trap softened.                   PT Short Term Goals - 11/05/15 0954      PT SHORT TERM GOAL #1   Title Pt will be independent with her HEP in order to improve functional mobility.    Baseline needs cues   Time 4   Period Weeks   Status On-going     PT SHORT TERM GOAL #2   Title Pt will be able to demonstrate bilateral arm swing with gait.    Time 4   Period Weeks   Status Unable to assess           PT Long Term Goals - 10/29/15 1317      PT LONG TERM GOAL #1   Title pt will improve her FOTO score from 38% limitation to </= 35% limitation   Baseline 38% limitation   Time 8   Period Weeks     PT LONG TERM GOAL #2   Title Further assess pt's bilateral LE pain   Time 8   Period Weeks   Status New     PT LONG TERM GOAL #3   Title Pt will improve R shoulder strength to >/=4/5 in order to improve functional mobiltiy, reaching, and lifting.    Time 8   Period Weeks   Status New               Plan - 11/05/15 2202    Clinical Impression Statement ROM greatlt improved in supine.  Less pain post manual and exercises.  Patient concerned about her weak legs. stating she may want to get home health  initially, then at the end of session she decided she would continue with PT in our clinic.    PT Next Visit Plan No ice or heat over port. Assess for scoliosis, Shoulder ROM, strengthening, gait with arm swing  (perhaps leg strengthening could be added to assist with gait)   PT Home Exercise Plan continue   Consulted and Agree with Plan of Care Patient  Patient will benefit from skilled therapeutic intervention in order to improve the following deficits and impairments:  Abnormal gait, Difficulty walking, Impaired UE functional use, Pain, Postural dysfunction, Decreased strength, Decreased mobility, Impaired flexibility, Improper body mechanics, Decreased activity tolerance, Decreased balance, Impaired perceived functional ability  Visit Diagnosis: Pain in right shoulder  Stiffness of right shoulder, not elsewhere classified     Problem List Patient Active Problem List   Diagnosis Date Noted  . Pain in joint of right shoulder 10/22/2015  . PAF (paroxysmal atrial fibrillation) ([place]) 09/29/2015  . Lichen planus 99/24/2683  . Healthcare maintenance 06/20/2015  . Seborrheic keratosis 06/20/2015  . Memory loss 01/30/2015  . Simple febrile convulsions ([place]) 10/21/2014  . Hyperammonemia ([place]) 10/03/2014  . Chronic kidney disease, stage III (moderate)   . Chronic diastolic CHF (congestive heart failure) ([place]) 10/03/2013  . DM (diabetes mellitus), type 2, uncontrolled, with renal complications ([place]) 41/96/2229  . HTN (hypertension), benign 03/02/2012  . 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

## 2015-11-05 NOTE — Therapy (Signed)
[place], [place], [address] Phone: [phone]   Fax:  [phone]  Physical Therapy Treatment  Patient Details  Name: [name] MRN: [id number] Date of Birth: 1940/06/08 Referring Provider: [name], DO  Encounter Date: 11/05/2015      PT End of Session - 11/05/15 0949    Visit Number 2   Number of Visits 16   Date for PT Re-Evaluation 12/29/15   PT Start Time 0850   PT Stop Time 0933   PT Time Calculation (min) 43 min   Activity Tolerance Patient tolerated treatment well   Behavior During Therapy [facility] for tasks assessed/performed      Past Medical History:  Diagnosis Date  . CHF (congestive heart failure) ([place])   . Chronic anemia   . Chronic diastolic CHF (congestive heart failure) ([place]) 10/03/2013  . Chronic GI bleeding    [name] 11/29/2014  . Family history of anesthesia complication    "niece has a hard time coming out" (09/15/2012)  . Frequent nosebleeds    chronic  . Gastric AV malformation    [name] 11/29/2014  . GERD (gastroesophageal reflux disease)   . Heart murmur   . History of blood transfusion "several"  . History of epistaxis   . HTN (hypertension), benign 03/02/2012  . Hyperlipidemia   . Iron deficiency anemia    chronic infusions"  . Lichen planus    Both lower extremities  . 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 Pneumonia 1990's X 2  . Seizures ([place]) 09/2014  . Telangiectasia    Gastric   . 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    Past Surgical History:  Procedure Laterality Date  . CATARACT EXTRACTION     "I think it was just one eye"  . ESOPHAGOGASTRODUODENOSCOPY  02/26/2011   Procedure: ESOPHAGOGASTRODUODENOSCOPY (EGD);  Surgeon: [name], MD;  Location: [name];  Service: Endoscopy;  Laterality: N/A;  . ESOPHAGOGASTRODUODENOSCOPY N/A 11/08/2012   Procedure: ESOPHAGOGASTRODUODENOSCOPY (EGD);  Surgeon: [name], MD;  Location: [name];  Service: Endoscopy;  Laterality: N/A;  . ESOPHAGOGASTRODUODENOSCOPY N/A 10/04/2013   Procedure: ESOPHAGOGASTRODUODENOSCOPY (EGD);  Surgeon: Winfield Cunas., MD;  Location: [name];  Service: Endoscopy;  Laterality: N/A;  with APC on stand-by  . ESOPHAGOGASTRODUODENOSCOPY N/A 07/06/2014   Procedure: ESOPHAGOGASTRODUODENOSCOPY (EGD);  Surgeon: Clarene Essex, MD;  Location: [name];  Service: Endoscopy;  Laterality: N/A;  . ESOPHAGOGASTRODUODENOSCOPY N/A 09/05/2014   Procedure: ESOPHAGOGASTRODUODENOSCOPY (EGD);  Surgeon: Laurence Spates, MD;  Location: [name];  Service: Endoscopy;  Laterality: N/A;  APC on standby to control bleeding  . ESOPHAGOGASTRODUODENOSCOPY N/A 11/29/2014   Procedure: ESOPHAGOGASTRODUODENOSCOPY (EGD);  Surgeon: [name], MD;  Location[facility] ENDOSCOPY;  Service: Endoscopy;  Laterality: N/A;  . ESOPHAGOGASTRODUODENOSCOPY N/A 09/28/2015   Procedure: ESOPHAGOGASTRODUODENOSCOPY (EGD);  Surgeon: Clarene Essex, MD;  Location: [facility] ENDOSCOPY;  Service: Endoscopy;  Laterality: N/A;  . ESOPHAGOGASTRODUODENOSCOPY ENDOSCOPY  08/19/2006   with laser treatment  . HOT HEMOSTASIS N/A 07/06/2014   Procedure: HOT HEMOSTASIS (ARGON PLASMA COAGULATION/BICAP);  Surgeon: Clarene Essex, MD;  Location: [name];  Service: Endoscopy;  Laterality: N/A;  . HOT HEMOSTASIS N/A 09/28/2015   Procedure: HOT HEMOSTASIS (ARGON PLASMA COAGULATION/BICAP);  Surgeon: Clarene Essex, MD;  Location: [facility] ENDOSCOPY;  Service: Endoscopy;  Laterality: N/A;  . NASAL HEMORRHAGE CONTROL     "for bleeding"   . SAVORY DILATION  02/26/2011   Procedure: SAVORY DILATION;  Surgeon: [name], MD;  Location: [name];  Service: Endoscopy;  Laterality: N/A;  c-arm needed    There were no vitals filed for this visit.      Subjective Assessment - 11/05/15 0857    Subjective Wantd to get home health.  My legs are more of a problem than my shoulder. I was able to cook  yesterday.   Currently in Pain? Yes   Pain Score 5    Pain Location Shoulder   Pain Orientation Right   Pain Descriptors / Indicators Aching;Sore   Pain Frequency Intermittent   Aggravating Factors  Lying on shoulder   Pain Relieving Factors changin positions   Effect of Pain on Daily Activities Unable to clean   Pain Score 3   Pain Location Leg   Pain Orientation Right;Left   Pain Descriptors / Indicators Aching   Pain Frequency Intermittent   Aggravating Factors  walking   Pain Relieving Factors rest, using wheelchairin community for longer distances.            [facility] PT Assessment - 11/05/15 0001      AROM   Overall AROM  --  flexion 155 supine Right   Right Shoulder ABduction 34 Degrees  After manual, supine   Right Shoulder External Rotation 52 Degrees  supine, neutral 52,  shoulder at 90,85 degrees painful end                     [facility] Adult PT Treatment/Exercise - 11/05/15 0001      Shoulder Exercises: Supine   Horizontal ABduction 10 reps  cane, supine   External Rotation 10 reps   External Rotation Limitations cane   Flexion 10 reps  cane   Flexion Limitations AA initially   ABduction 10 reps   ABduction Limitations AA for technique   Other Supine Exercises cane series 10 X  chest press, Horizontal add/ abd     Manual Therapy   Manual therapy comments PROM  Rt shoulder 10 x each,  soft tissue work upper trap wit triggerpoint release.   Upper trap softened.                   PT Short Term Goals - 11/05/15 0954      PT SHORT TERM GOAL #1   Title Pt will be independent with her HEP in order to improve functional mobility.    Baseline needs cues   Time 4   Period Weeks   Status On-going     PT SHORT TERM GOAL #2   Title Pt will be able to demonstrate bilateral arm swing with gait.    Time 4   Period Weeks   Status Unable to assess           PT Long Term Goals - 10/29/15 1317      PT LONG TERM GOAL #1   Title pt  will improve her FOTO score from 38% limitation to </= 35% limitation   Baseline 38% limitation   Time 8   Period Weeks     PT LONG TERM GOAL #2   Title Further assess pt's bilateral LE pain   Time 8   Period Weeks   Status New     PT LONG TERM GOAL #3   Title Pt will improve R shoulder strength to >/=4/5 in order to improve functional mobiltiy, reaching, and lifting.    Time 8   Period Weeks   Status New  Plan - 11/05/15 0949    Clinical Impression Statement ROM greatlt improved in supine.  Less pain post manual and exercises.  Patient concerned about her weak legs. stating she may want to get home health initially, then at the end of session she decided she would continue with PT in our clinic.    PT Next Visit Plan No ice or heat over port. Assess for scoliosis, Shoulder ROM, strengthening, gait with arm swing  (perhaps leg strengthening could be added to assist with gait)   PT Home Exercise Plan continue   Consulted and Agree with Plan of Care Patient      Patient will benefit from skilled therapeutic intervention in order to improve the following deficits and impairments:  Abnormal gait, Difficulty walking, Impaired UE functional use, Pain, Postural dysfunction, Decreased strength, Decreased mobility, Impaired flexibility, Improper body mechanics, Decreased activity tolerance, Decreased balance, Impaired perceived functional ability  Visit Diagnosis: Pain in right shoulder  Stiffness of right shoulder, not elsewhere classified     Problem List Patient Active Problem List   Diagnosis Date Noted  . Pain in joint of right shoulder 10/22/2015  . PAF (paroxysmal atrial fibrillation) ([place]) 09/29/2015  . Lichen planus 16/11/9602  . Healthcare maintenance 06/20/2015  . Seborrheic keratosis 06/20/2015  . Memory loss 01/30/2015  . Simple febrile convulsions ([place]) 10/21/2014  . Hyperammonemia ([place]) 10/03/2014  . Chronic kidney disease, stage III  (moderate)   . Chronic diastolic CHF (congestive heart failure) ([place]) 10/03/2013  . DM (diabetes mellitus), type 2, uncontrolled, with renal complications ([place]) 54/10/8117  . HTN (hypertension), benign 03/02/2012  . 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 [place], [place], [address] Phone: [phone]   Fax:  [phone]  Name: [name] MRN: [id number] Date of Birth: November 04, 1940

## 2015-11-06 ENCOUNTER — Telehealth: Payer: Self-pay | Admitting: *Deleted

## 2015-11-06 ENCOUNTER — Ambulatory Visit: Payer: Medicare Other | Admitting: Physical Therapy

## 2015-11-06 DIAGNOSIS — M25511 Pain in right shoulder: Secondary | ICD-10-CM | POA: Diagnosis not present

## 2015-11-06 DIAGNOSIS — M25611 Stiffness of right shoulder, not elsewhere classified: Secondary | ICD-10-CM

## 2015-11-06 NOTE — Telephone Encounter (Signed)
-----   Message from [name], MD sent at 11/05/2015  1:51 PM EDT ----- Call pt: blood count good - no transfusion this week

## 2015-11-06 NOTE — Therapy (Signed)
[place], [place], [address] Phone: [phone]   Fax:  [phone]  Physical Therapy Treatment  Patient Details  Name: [name] MRN: [id number] Date of Birth: 1940/04/04 Referring Provider: [name], DO  Encounter Date: 11/06/2015      PT End of Session - 11/06/15 0941    Visit Number 3   Number of Visits 16   Date for PT Re-Evaluation 12/29/15   Authorization Type KX modifier after 15 visit, G -codes   PT Start Time 0933   PT Stop Time 1015   PT Time Calculation (min) 42 min      Past Medical History:  Diagnosis Date  . CHF (congestive heart failure) ([place])   . Chronic anemia   . Chronic diastolic CHF (congestive heart failure) ([place]) 10/03/2013  . Chronic GI bleeding    [name] 11/29/2014  . Family history of anesthesia complication    "niece has a hard time coming out" (09/15/2012)  . Frequent nosebleeds    chronic  . Gastric AV malformation    [name] 11/29/2014  . GERD (gastroesophageal reflux disease)   . Heart murmur   . History of blood transfusion "several"  . History of epistaxis   . HTN (hypertension), benign 03/02/2012  . Hyperlipidemia   . Iron deficiency anemia    chronic infusions"  . Lichen planus    Both lower extremities  . 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 Pneumonia 1990's X 2  . Seizures ([place]) 09/2014  . Telangiectasia    Gastric   . 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    Past Surgical History:  Procedure Laterality Date  . CATARACT EXTRACTION     "I think it was just one eye"  . ESOPHAGOGASTRODUODENOSCOPY  02/26/2011   Procedure: ESOPHAGOGASTRODUODENOSCOPY (EGD);  Surgeon: [name], MD;  Location: [name];  Service: Endoscopy;  Laterality: N/A;  . ESOPHAGOGASTRODUODENOSCOPY N/A 11/08/2012   Procedure: ESOPHAGOGASTRODUODENOSCOPY (EGD);  Surgeon: [name], MD;  Location: [name];  Service:  Endoscopy;  Laterality: N/A;  . ESOPHAGOGASTRODUODENOSCOPY N/A 10/04/2013   Procedure: ESOPHAGOGASTRODUODENOSCOPY (EGD);  Surgeon: Winfield Cunas., MD;  Location: [name];  Service: Endoscopy;  Laterality: N/A;  with APC on stand-by  . ESOPHAGOGASTRODUODENOSCOPY N/A 07/06/2014   Procedure: ESOPHAGOGASTRODUODENOSCOPY (EGD);  Surgeon: Clarene Essex, MD;  Location: [name];  Service: Endoscopy;  Laterality: N/A;  . ESOPHAGOGASTRODUODENOSCOPY N/A 09/05/2014   Procedure: ESOPHAGOGASTRODUODENOSCOPY (EGD);  Surgeon: Laurence Spates, MD;  Location: [name];  Service: Endoscopy;  Laterality: N/A;  APC on standby to control bleeding  . ESOPHAGOGASTRODUODENOSCOPY N/A 11/29/2014   Procedure: ESOPHAGOGASTRODUODENOSCOPY (EGD);  Surgeon: [name], MD;  Location: [facility] ENDOSCOPY;  Service: Endoscopy;  Laterality: N/A;  . ESOPHAGOGASTRODUODENOSCOPY N/A 09/28/2015   Procedure: ESOPHAGOGASTRODUODENOSCOPY (EGD);  Surgeon: Clarene Essex, MD;  Location: [facility] ENDOSCOPY;  Service: Endoscopy;  Laterality: N/A;  . ESOPHAGOGASTRODUODENOSCOPY ENDOSCOPY  08/19/2006   with laser treatment  . HOT HEMOSTASIS N/A 07/06/2014   Procedure: HOT HEMOSTASIS (ARGON PLASMA COAGULATION/BICAP);  Surgeon: Clarene Essex, MD;  Location: [name];  Service: Endoscopy;  Laterality: N/A;  . HOT HEMOSTASIS N/A 09/28/2015   Procedure: HOT HEMOSTASIS (ARGON PLASMA COAGULATION/BICAP);  Surgeon: Clarene Essex, MD;  Location: [facility] ENDOSCOPY;  Service: Endoscopy;  Laterality: N/A;  . NASAL HEMORRHAGE CONTROL     "for bleeding"   . SAVORY DILATION  02/26/2011   Procedure: SAVORY DILATION;  Surgeon: [name], MD;  Location: [name];  Service: Endoscopy;  Laterality: N/A;  c-arm needed    There were no vitals filed for this visit.      Subjective Assessment - 11/06/15 0940    Subjective Only hurts a [place] sometimes. Legs feel weak.    Currently in Pain? No/denies                         [facility] Adult PT  Treatment/Exercise - 11/06/15 0001      Shoulder Exercises: Supine   Other Supine Exercises cane series 10 X  chest press, Horizontal add/ abd, pullovers, ER     Shoulder Exercises: Seated   Retraction 10 reps   Row Strengthening;Both;20 reps;Theraband   Theraband Level (Shoulder Row) Level 2 (Red)   Other Seated Exercises sitting posture, shoulder rolls -max cues required     Shoulder Exercises: Standing   Row 10 reps;Theraband   Theraband Level (Shoulder Row) Level 2 (Red)  shown how to use door   Retraction 10 reps  max tactile cues     Manual Therapy   Manual Therapy Joint mobilization;Passive ROM   Joint Mobilization Grade 2 joint mobs Ap and inferior glides    Passive ROM Passive flexion, scaption, abduction, ER IR                   PT Short Term Goals - 11/05/15 0954      PT SHORT TERM GOAL #1   Title Pt will be independent with her HEP in order to improve functional mobility.    Baseline needs cues   Time 4   Period Weeks   Status On-going     PT SHORT TERM GOAL #2   Title Pt will be able to demonstrate bilateral arm swing with gait.    Time 4   Period Weeks   Status Unable to assess           PT Long Term Goals - 10/29/15 1317      PT LONG TERM GOAL #1   Title pt will improve her FOTO score from 38% limitation to </= 35% limitation   Baseline 38% limitation   Time 8   Period Weeks     PT LONG TERM GOAL #2   Title Further assess pt's bilateral LE pain   Time 8   Period Weeks   Status New     PT LONG TERM GOAL #3   Title Pt will improve R shoulder strength to >/=4/5 in order to improve functional mobiltiy, reaching, and lifting.    Time 8   Period Weeks   Status New               Plan - 11/06/15 1106    Clinical Impression Statement Pt reports much improved shoulder pain. She has pain with end range flexion and abduction with passive ROM. Gentle joint mobs to increase ROM followed by passive and AAROM exercises. Spent most  time attempting correct scap squeeze and sitting posture. Max cues for correct row with red theraband, much better mechanics by end of treatment. Right shoulder higher, tigher and tender. Pt reports no increased pain at end of session.    PT Next Visit Plan No ice or heat over port. Assess for scoliosis, Shoulder ROM, strengthening, gait with arm swing  (perhaps leg strengthening could be added to assist with gait)GIVE UPPER TRAP STRETCH       Patient will benefit from skilled therapeutic intervention in  order to improve the following deficits and impairments:  Abnormal gait, Difficulty walking, Impaired UE functional use, Pain, Postural dysfunction, Decreased strength, Decreased mobility, Impaired flexibility, Improper body mechanics, Decreased activity tolerance, Decreased balance, Impaired perceived functional ability  Visit Diagnosis: Pain in right shoulder  Stiffness of right shoulder, not elsewhere classified     Problem List Patient Active Problem List   Diagnosis Date Noted  . Pain in joint of right shoulder 10/22/2015  . PAF (paroxysmal atrial fibrillation) ([place]) 09/29/2015  . Lichen planus 04/79/9872  . Healthcare maintenance 06/20/2015  . Seborrheic keratosis 06/20/2015  . Memory loss 01/30/2015  . Simple febrile convulsions ([place]) 10/21/2014  . Hyperammonemia ([place]) 10/03/2014  . Chronic kidney disease, stage III (moderate)   . Chronic diastolic CHF (congestive heart failure) ([place]) 10/03/2013  . DM (diabetes mellitus), type 2, uncontrolled, with renal complications ([place]) 15/87/2761  . HTN (hypertension), benign 03/02/2012  . 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 [place], [place], [address] Phone: [phone]   Fax:  [phone]  Name: [name] MRN: [id number] Date of Birth:  [birth year]

## 2015-11-06 NOTE — Telephone Encounter (Signed)
Pt called - talked to Mr Mazo - informed "blood count good (hgb 9.8)- no transfusion this week" per Dr [name]. Stated he will let Mrs Grabinski of the good news.

## 2015-11-07 ENCOUNTER — Encounter: Payer: Self-pay | Admitting: *Deleted

## 2015-11-07 ENCOUNTER — Ambulatory Visit (INDEPENDENT_AMBULATORY_CARE_PROVIDER_SITE_OTHER): Payer: Medicare Other | Admitting: Adult Health

## 2015-11-07 ENCOUNTER — Other Ambulatory Visit: Payer: Medicare Other

## 2015-11-07 VITALS — BP 138/47 | HR 59 | Wt 147.2 lb

## 2015-11-07 DIAGNOSIS — R569 Unspecified convulsions: Secondary | ICD-10-CM

## 2015-11-07 DIAGNOSIS — R413 Other amnesia: Secondary | ICD-10-CM | POA: Diagnosis not present

## 2015-11-07 MED ORDER — MEMANTINE HCL 10 MG PO TABS: 10.0000 mg | ORAL_TABLET | Freq: Two times a day (BID) | ORAL | 11 refills | Status: DC

## 2015-11-07 NOTE — Patient Instructions (Signed)
Increase Namenda to 10 mg twice a day Continue Aricept  Continue Keppra If your symptoms worsen or you develop new symptoms please let us know.

## 2015-11-07 NOTE — Progress Notes (Signed)
PATIENT: [name] DOB: November 21, 1940  REASON FOR VISIT: follow up- memory disturbance, seizures HISTORY FROM: patient  HISTORY OF PRESENT ILLNESS: [name] is a 75 year old female with a history of memory disturbance. She returns today for follow-up. She feels that her memory has remained stable. She continues on Aricept. At the last visit she was started on Namenda. Reports that she is tolerating this well. She denies any seizure events. She remains on Keppra. She is able to complete most ADLs without assistance. She does not operate a motor vehicle. Denies any changes with her gait or balance. Denies any changes with her mood or behavior. Patient's husband is in the lobby but does not want to come to the exam room? Patient reports that she was admitted to the hospital in August due to a GI bleed. She denies any other medical history. Returns today for an evaluation.   08/07/15 (MM) : Ms. [name] is a 75 year old female with a history of memory disturbance. She returns today for follow-up. She states that her memory has remained stable. She is tolerating Aricept well. She lives at home with her husband. She states that Keppra does make her sleepy throughout the day. She is not had any seizure events. She does not operate a motor vehicle. She does prepare meals without difficulty. She states that she is trying to get Meals on Wheels but is on the wait list. She returns today for an evaluation.  01/30/15 [facility]): She is more forgetful. She forgets things she says, by the time she tella her husband something she has forgotten. They live independently. Pays all the bills, not skipping bills. No accidents in the home. She is losing things. Misplacing. Forgets appointments. Difficulty with organization more. Can't get herself together. She sleeps a lot. No dementia, no alzheimers in the family.   HPI: [name] is a 75 y.o. female here as a referral from Dr. Jeanie Cooks for seizure in the setting  of hyperammonemia. She has a past medical history of hypertension, congestive heart failure, diabetes, acute encephalopathy with status epilepticus, acute renal failure superimposed on stage III chronic kidney disease, urinary tract infections, hyperammonemia.  Patient here with husband. She was admitted with an episode of seizure-like activity. Husband provides assist of the information. He describes the incident. She was shaking her hands and head, trying to catch her breath. All information provided by husband. Lasted a few minutes. She was not responsive. It was on a Tuesday. Then she slept for 5 hours afterwards. Then she vomited while sleeping and then they called 911 and went to the emergency room. This is not the first time it has happened. It has happened in the past. Husband had seen the jerking of the limbs and then she would sleep a while and wake up. He says he has seen in twice in the last year. No family history of seizures. She is much better now. Therapy is coming to her house. Not having any side effects from the medication. She has had episodes of AMS and confusion in the past.   Reviewed notes, labs and imaging from outside physicians, which showed:  MRI of the brain 08/2014 personally reviewed images and agree with following report. : 1. No acute infarct identified. 2. Increased basal ganglia intrinsic T1 signal which can be seen with hepatic insufficiency, cirrhosis, portosystemic shunt, portal vein thrombosis, total parenteral nutrition, and is less often seen with hyperglycemia or other metabolic disturbances such as Wilson's disease. Correlation with serum  ammonia levels and liver function tests is recommended. 3. Advanced but nonspecific cerebral white matter signal changes. Most commonly this is due to chronic small vessel disease. Other considerations include hypercoagulable state, vasculitis, , prior infection or demyelination.  CBC showed anemia, anemia while  hospitalized 327. Hepatic function was normal. One month previous she had a GI bleed. Lactulose was started for elevated ammonia. An EEG was ordered which was concerning for frontal status epilepticus. Hyperammonemia was thought to be the driver of the seizures however she was started on Keppra. She was discharged with the diagnosis of acute encephalopathy and hyperammonemia, she was discharged on Keppra 500 mg twice daily.  Patient presented to the emergency room in late August with altered mental status and lethargy. CT of the head imaging was unremarkable patient was afebrile, no leukocytosis, ammonia was 327, BUN was 32 and creatinine was 1.38.  REVIEW OF SYSTEMS: Out of a complete 14 system review of symptoms, the patient complains only of the following symptoms, and all other reviewed systems are negative.  See history of present illness  ALLERGIES: Allergies  Allergen Reactions  . Aspirin Nausea And Vomiting    HOME MEDICATIONS: Outpatient Medications Prior to Visit  Medication Sig Dispense Refill  . AMITIZA 24 MCG capsule Take 24 mcg by mouth 2 (two) times daily.    . diclofenac sodium (VOLTAREN) 1 % GEL Apply 2 g topically 4 (four) times daily. 100 g 1  . donepezil (ARICEPT) 10 MG tablet Take 1 tablet (10 mg total) by mouth at bedtime. 30 tablet 11  . ferrous fumarate (HEMOCYTE - 106 MG FE) 325 (106 FE) MG TABS tablet Take 1 tablet by mouth 2 (two) times daily. Reported on 01/30/2015    . furosemide (LASIX) 20 MG tablet Take 1 tablet (20 mg total) by mouth daily. Please call and schedule an appointment 30 tablet 4  . glipiZIDE (GLUCOTROL) 5 MG tablet Take 0.5 tablets (2.5 mg total) by mouth daily before breakfast. 90 tablet 0  . Insulin Detemir (LEVEMIR FLEXTOUCH) 100 UNIT/ML Pen Inject 20 Units into the skin 2 (two) times daily. 15 mL 11  . lactulose (CHRONULAC) 10 GM/15ML solution Take 15 mLs (10 g total) by mouth 2 (two) times daily. 473 mL 2  . levETIRAcetam (KEPPRA) 500 MG  tablet Take 1 tablet (500 mg total) by mouth 2 (two) times daily. 60 tablet 11  . memantine (NAMENDA) 5 MG tablet Take 1 tablet PO daily for 1 week then increase to 1 tablet  PO BID. 60 tablet 5  . oxymetazoline (AFRIN) 0.05 % nasal spray Place 1 spray into both nostrils 2 (two) times daily as needed (Epistaxis). 30 mL 0  . pantoprazole (PROTONIX) 40 MG tablet Take 1 tablet (40 mg total) by mouth 2 (two) times daily. 60 tablet 1  . potassium chloride SA (K-DUR,KLOR-CON) 20 MEQ tablet Take 1 tablet (20 mEq total) by mouth daily. 30 tablet 11  . traMADol (ULTRAM) 50 MG tablet Take 50 mg by mouth as needed for moderate pain.      Facility-Administered Medications Prior to Visit  Medication Dose Route Frequency Provider Last Rate Last Dose  . 0.9 %  sodium chloride infusion  250 mL Intravenous Once [name], MD      . sodium chloride flush (NS) 0.9 % injection 10 mL  10 mL Intravenous PRN [name], MD   10 mL at 08/16/15 1535    PAST MEDICAL HISTORY: Past Medical History:  Diagnosis Date  .  CHF (congestive heart failure) ([place])   . Chronic anemia   . Chronic diastolic CHF (congestive heart failure) ([place]) 10/03/2013  . Chronic GI bleeding    [name] 11/29/2014  . Family history of anesthesia complication    "niece has a hard time coming out" (09/15/2012)  . Frequent nosebleeds    chronic  . Gastric AV malformation    [name] 11/29/2014  . GERD (gastroesophageal reflux disease)   . Heart murmur   . History of blood transfusion "several"  . History of epistaxis   . HTN (hypertension), benign 03/02/2012  . Hyperlipidemia   . Iron deficiency anemia    chronic infusions"  . Lichen planus    Both lower extremities  . 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 Pneumonia 1990's X 2  . Seizures ([place]) 09/2014  . Telangiectasia    Gastric   . 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    PAST SURGICAL HISTORY: Past Surgical History:  Procedure  Laterality Date  . CATARACT EXTRACTION     "I think it was just one eye"  . ESOPHAGOGASTRODUODENOSCOPY  02/26/2011   Procedure: ESOPHAGOGASTRODUODENOSCOPY (EGD);  Surgeon: [name], MD;  Location: [name];  Service: Endoscopy;  Laterality: N/A;  . ESOPHAGOGASTRODUODENOSCOPY N/A 11/08/2012   Procedure: ESOPHAGOGASTRODUODENOSCOPY (EGD);  Surgeon: [name], MD;  Location: [name];  Service: Endoscopy;  Laterality: N/A;  . ESOPHAGOGASTRODUODENOSCOPY N/A 10/04/2013   Procedure: ESOPHAGOGASTRODUODENOSCOPY (EGD);  Surgeon: Winfield Cunas., MD;  Location: [name];  Service: Endoscopy;  Laterality: N/A;  with APC on stand-by  . ESOPHAGOGASTRODUODENOSCOPY N/A 07/06/2014   Procedure: ESOPHAGOGASTRODUODENOSCOPY (EGD);  Surgeon: Clarene Essex, MD;  Location: [name];  Service: Endoscopy;  Laterality: N/A;  . ESOPHAGOGASTRODUODENOSCOPY N/A 09/05/2014   Procedure: ESOPHAGOGASTRODUODENOSCOPY (EGD);  Surgeon: Laurence Spates, MD;  Location: [name];  Service: Endoscopy;  Laterality: N/A;  APC on standby to control bleeding  . ESOPHAGOGASTRODUODENOSCOPY N/A 11/29/2014   Procedure: ESOPHAGOGASTRODUODENOSCOPY (EGD);  Surgeon: [name], MD;  Location: [facility] ENDOSCOPY;  Service: Endoscopy;  Laterality: N/A;  . ESOPHAGOGASTRODUODENOSCOPY N/A 09/28/2015   Procedure: ESOPHAGOGASTRODUODENOSCOPY (EGD);  Surgeon: Clarene Essex, MD;  Location: [facility] ENDOSCOPY;  Service: Endoscopy;  Laterality: N/A;  . ESOPHAGOGASTRODUODENOSCOPY ENDOSCOPY  08/19/2006   with laser treatment  . HOT HEMOSTASIS N/A 07/06/2014   Procedure: HOT HEMOSTASIS (ARGON PLASMA COAGULATION/BICAP);  Surgeon: Clarene Essex, MD;  Location: [name];  Service: Endoscopy;  Laterality: N/A;  . HOT HEMOSTASIS N/A 09/28/2015   Procedure: HOT HEMOSTASIS (ARGON PLASMA COAGULATION/BICAP);  Surgeon: Clarene Essex, MD;  Location: [facility] ENDOSCOPY;  Service: Endoscopy;  Laterality: N/A;  . NASAL HEMORRHAGE CONTROL     "for bleeding"   . SAVORY  DILATION  02/26/2011   Procedure: SAVORY DILATION;  Surgeon: [name], MD;  Location: [name];  Service: Endoscopy;  Laterality: N/A;  c-arm needed    FAMILY HISTORY: Family History  Problem Relation Age of Onset  . Stroke Father   . Breast cancer    . Malignant hyperthermia Neg Hx   . Seizures Neg Hx     SOCIAL HISTORY: Social History   Social History  . Marital status: Married    Spouse name: [name]  . Number of children: 0  . Years of education: 9   Occupational History  . Retired Engineer, manufacturing systems Retired   Social History Main Topics  . Smoking status: Former Smoker    Packs/day: 1.00    Years: 20.00  Types: Cigarettes    Quit date: 02/10/1971  . Smokeless tobacco: Never Used     Comment: 09/15/2012 "smoked 50-60 yr ago"  . Alcohol use No  . Drug use: No  . Sexual activity: Not on file   Other Topics Concern  . Not on file   Social History Narrative   Married, lives with husband, [name].  Ambulates without assistance.     Caffeine use: none      PHYSICAL EXAM  Vitals:   11/07/15 0934  BP: (!) 138/47  Pulse: (!) 59  Weight: 147 lb 3.2 oz (66.8 kg)   Body mass index is 26.08 kg/m.   MMSE - Mini Mental State Exam 11/07/2015  Orientation to time 3  Orientation to Place 4  Registration 3  Attention/ Calculation 2  Recall 1  Language- name 2 objects 2  Language- repeat 0  Language- follow 3 step command 3  Language- read & follow direction 1  Write a sentence 1  Copy design 0  Total score 20     Generalized: Well developed, in no acute distress   Neurological examination  Mentation: Alert oriented to time, place, history taking. Follows all commands speech and language fluent Cranial nerve II-XII: Pupils were equal round reactive to light. Extraocular movements were full, visual field were full on confrontational test. Facial sensation and strength were normal. Uvula tongue midline. Head turning and shoulder shrug  were normal and  symmetric. Motor: The motor testing reveals 5 over 5 strength of all 4 extremities. Good symmetric motor tone is noted throughout.  Sensory: Sensory testing is intact to soft touch on all 4 extremities. No evidence of extinction is noted.  Coordination: Cerebellar testing reveals good finger-nose-finger and heel-to-shin bilaterally.  Gait and station: Gait is normal. Tandem gait is normal. Romberg is negative. No drift is seen.  Reflexes: Deep tendon reflexes are symmetric and normal bilaterally.   DIAGNOSTIC DATA (LABS, IMAGING, TESTING) - I reviewed patient records, labs, notes, testing and imaging myself where available.  Lab Results  Component Value Date   WBC 5.1 11/05/2015   HGB 9.8 (L) 11/05/2015   HCT 29.7 (L) 11/05/2015   MCV 88.9 11/05/2015   PLT 197 11/05/2015      Component Value Date/Time   NA 145 09/29/2015 0321   NA 144 04/04/2014 1019   K 3.9 09/29/2015 0321   K 4.5 04/04/2014 1019   CL 113 (H) 09/29/2015 0321   CL 111 (H) 12/28/2011 0825   CO2 27 09/29/2015 0321   CO2 26 04/04/2014 1019   GLUCOSE 70 09/29/2015 0321   GLUCOSE 156 (H) 04/04/2014 1019   GLUCOSE 152 (H) 12/28/2011 0825   BUN 13 09/29/2015 0321   BUN 16.9 04/04/2014 1019   CREATININE 1.16 (H) 09/29/2015 0321   CREATININE 0.92 01/28/2015 0811   CREATININE 0.9 04/04/2014 1019   CALCIUM 8.7 (L) 09/29/2015 0321   CALCIUM 9.3 04/04/2014 1019   PROT 4.7 (L) 09/28/2015 0350   PROT 6.4 04/04/2014 1019   ALBUMIN 2.6 (L) 09/28/2015 0350   ALBUMIN 3.3 (L) 04/04/2014 1019   AST 27 09/28/2015 0350   AST 30 04/04/2014 1019   ALT 19 09/28/2015 0350   ALT 25 04/04/2014 1019   ALKPHOS 74 09/28/2015 0350   ALKPHOS 140 04/04/2014 1019   BILITOT 0.7 09/28/2015 0350   BILITOT 0.43 04/04/2014 1019   GFRNONAA 45 (L) 09/29/2015 0321   GFRAA 52 (L) 09/29/2015 0321   Lab Results  Component Value Date  CHOL 141 11/29/2014   HDL 31 (L) 11/29/2014   LDLCALC 82 11/29/2014   TRIG 141 11/29/2014   CHOLHDL 4.5  11/29/2014   Lab Results  Component Value Date   HGBA1C 7.1 06/20/2015   Lab Results  Component Value Date   [id number] 741 11/29/2014   Lab Results  Component Value Date   TSH 1.730 01/30/2015      ASSESSMENT AND PLAN 75 y.o. 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 here with:  1. Memory disturbance 2. Seizures   Patient will increase Namenda to 10 mg twice a day. She will continue on Aricept. . The patient denies any seizure events. She will continue on Keppra 500 mg twice a day. Patient advised that if her symptoms worsen or she develops any new symptoms she should let us know. Will follow-up in 6 months or sooner if needed.  Ward Givens, MSN, NP-C 11/07/2015, 9:29 AM [facility] [address], [place], [place] [address] [phone]

## 2015-11-12 ENCOUNTER — Ambulatory Visit: Payer: Medicare Other | Attending: Internal Medicine | Admitting: Physical Therapy

## 2015-11-12 DIAGNOSIS — M25511 Pain in right shoulder: Secondary | ICD-10-CM | POA: Diagnosis present

## 2015-11-12 DIAGNOSIS — R2689 Other abnormalities of gait and mobility: Secondary | ICD-10-CM | POA: Diagnosis present

## 2015-11-12 DIAGNOSIS — M25611 Stiffness of right shoulder, not elsewhere classified: Secondary | ICD-10-CM

## 2015-11-12 DIAGNOSIS — R262 Difficulty in walking, not elsewhere classified: Secondary | ICD-10-CM | POA: Insufficient documentation

## 2015-11-12 DIAGNOSIS — G8929 Other chronic pain: Secondary | ICD-10-CM | POA: Insufficient documentation

## 2015-11-12 DIAGNOSIS — M6281 Muscle weakness (generalized): Secondary | ICD-10-CM | POA: Insufficient documentation

## 2015-11-12 DIAGNOSIS — M25562 Pain in left knee: Secondary | ICD-10-CM | POA: Insufficient documentation

## 2015-11-12 NOTE — Patient Instructions (Signed)
Try a rolled towel for sitting posture assist.  Stand tall with walking like you are trying to show everyone your necklace.

## 2015-11-12 NOTE — Therapy (Signed)
[facility] [place], [place], [address] Phone: [phone]   Fax:  [phone]  Physical Therapy Treatment  Patient Details  Name: [name] MRN: [id number] Date of Birth: 1940-09-16 Referring Provider: [name], DO  Encounter Date: 11/12/2015      PT End of Session - 11/12/15 1037    Visit Number 4   Number of Visits 16   Date for PT Re-Evaluation 12/29/15   PT Start Time 0958   PT Stop Time 1036   PT Time Calculation (min) 38 min   Activity Tolerance Patient tolerated treatment well   Behavior During Therapy [facility] for tasks assessed/performed      Past Medical History:  Diagnosis Date  . CHF (congestive heart failure) ([place])   . Chronic anemia   . Chronic diastolic CHF (congestive heart failure) ([place]) 10/03/2013  . Chronic GI bleeding    [name] 11/29/2014  . Family history of anesthesia complication    "niece has a hard time coming out" (09/15/2012)  . Frequent nosebleeds    chronic  . Gastric AV malformation    [name] 11/29/2014  . GERD (gastroesophageal reflux disease)   . Heart murmur   . History of blood transfusion "several"  . History of epistaxis   . HTN (hypertension), benign 03/02/2012  . Hyperlipidemia   . Iron deficiency anemia    chronic infusions"  . Lichen planus    Both lower extremities  . 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 Pneumonia 1990's X 2  . Seizures ([place]) 09/2014  . Telangiectasia    Gastric   . 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    Past Surgical History:  Procedure Laterality Date  . CATARACT EXTRACTION     "I think it was just one eye"  . ESOPHAGOGASTRODUODENOSCOPY  02/26/2011   Procedure: ESOPHAGOGASTRODUODENOSCOPY (EGD);  Surgeon: [name], MD;  Location: [name];  Service: Endoscopy;  Laterality: N/A;  . ESOPHAGOGASTRODUODENOSCOPY N/A 11/08/2012   Procedure: ESOPHAGOGASTRODUODENOSCOPY (EGD);  Surgeon: [name], MD;  Location: [name];  Service: Endoscopy;  Laterality: N/A;  . ESOPHAGOGASTRODUODENOSCOPY N/A 10/04/2013   Procedure: ESOPHAGOGASTRODUODENOSCOPY (EGD);  Surgeon: Winfield Cunas., MD;  Location: [name];  Service: Endoscopy;  Laterality: N/A;  with APC on stand-by  . ESOPHAGOGASTRODUODENOSCOPY N/A 07/06/2014   Procedure: ESOPHAGOGASTRODUODENOSCOPY (EGD);  Surgeon: Clarene Essex, MD;  Location: [name];  Service: Endoscopy;  Laterality: N/A;  . ESOPHAGOGASTRODUODENOSCOPY N/A 09/05/2014   Procedure: ESOPHAGOGASTRODUODENOSCOPY (EGD);  Surgeon: Laurence Spates, MD;  Location: [name];  Service: Endoscopy;  Laterality: N/A;  APC on standby to control bleeding  . ESOPHAGOGASTRODUODENOSCOPY N/A 11/29/2014   Procedure: ESOPHAGOGASTRODUODENOSCOPY (EGD);  Surgeon: [name], MD;  Location: [facility] ENDOSCOPY;  Service: Endoscopy;  Laterality: N/A;  . ESOPHAGOGASTRODUODENOSCOPY N/A 09/28/2015   Procedure: ESOPHAGOGASTRODUODENOSCOPY (EGD);  Surgeon: Clarene Essex, MD;  Location: [facility] ENDOSCOPY;  Service: Endoscopy;  Laterality: N/A;  . ESOPHAGOGASTRODUODENOSCOPY ENDOSCOPY  08/19/2006   with laser treatment  . HOT HEMOSTASIS N/A 07/06/2014   Procedure: HOT HEMOSTASIS (ARGON PLASMA COAGULATION/BICAP);  Surgeon: Clarene Essex, MD;  Location: [name];  Service: Endoscopy;  Laterality: N/A;  . HOT HEMOSTASIS N/A 09/28/2015   Procedure: HOT HEMOSTASIS (ARGON PLASMA COAGULATION/BICAP);  Surgeon: Clarene Essex, MD;  Location: [facility] ENDOSCOPY;  Service: Endoscopy;  Laterality: N/A;  . NASAL HEMORRHAGE CONTROL     "for bleeding"   . SAVORY DILATION  02/26/2011   Procedure: SAVORY DILATION;  Surgeon: [name], MD;  Location: [name];  Service: Endoscopy;  Laterality: N/A;  c-arm needed    There were no vitals filed for this visit.      Subjective Assessment - 11/12/15 1000    Subjective I'm late because i was at another appointment.   Currently in Pain? Yes   Pain Score 7    Pain  Location Shoulder   Pain Orientation Right   Pain Descriptors / Indicators Aching   Pain Frequency Intermittent   Aggravating Factors  reaching   Pain Relieving Factors using a pillow   Pain Location Leg   Pain Orientation Right;Left   Pain Descriptors / Indicators Aching   Pain Frequency Intermittent   Aggravating Factors  walking   Pain Relieving Factors rest, using wheeled chair for longer distances.             [facility] PT Assessment - 11/12/15 0001      AROM   Overall AROM  --  flexion 100 degrees AROM rt shoulder,   Right Shoulder Horizontal ABduction 78 Degrees                     [facility] Adult PT Treatment/Exercise - 11/12/15 0001      Posture/Postural Control   Posture Comments sitting/standing posture education     Moist Heat Therapy   Number Minutes Moist Heat 15 Minutes   Moist Heat Location Shoulder  posterior.  Care taken to avoid port anterior right chest     Manual Therapy   Manual Therapy Soft tissue mobilization   Soft tissue mobilization Right shoulder, upper back upper traps, levator deltoid.,  some softening noted,  several tight areas remain.                PT Education - 11/12/15 1037    Education provided Yes   Education Details sitting, standing posture.    Person(s) Educated Patient          PT Short Term Goals - 11/05/15 0954      PT SHORT TERM GOAL #1   Title Pt will be independent with her HEP in order to improve functional mobility.    Baseline needs cues   Time 4   Period Weeks   Status On-going     PT SHORT TERM GOAL #2   Title Pt will be able to demonstrate bilateral arm swing with gait.    Time 4   Period Weeks   Status Unable to assess           PT Long Term Goals - 10/29/15 1317      PT LONG TERM GOAL #1   Title pt will improve her FOTO score from 38% limitation to </= 35% limitation   Baseline 38% limitation   Time 8   Period Weeks     PT LONG TERM GOAL #2   Title Further assess pt's  bilateral LE pain   Time 8   Period Weeks   Status New     PT LONG TERM GOAL #3   Title Pt will improve R shoulder strength to >/=4/5 in order to improve functional mobiltiy, reaching, and lifting.    Time 8   Period Weeks   Status New               Plan - 11/12/15 1038    Clinical Impression Statement Short session patient late 30 minutes due to another appointment.  patient able to demonstrate improved posture  after session.  No new goals assessed due to lack of time. Port easily avoided for modalities    PT Next Visit Plan Check goals. No ice or heat over port. Assess for scoliosis, Shoulder ROM, strengthening, gait with arm swing  (perhaps leg strengthening could be added to assist with gait)GIVE UPPER TRAP STRETCH    PT Home Exercise Plan continue, try towel roll at lower back for sitting   Consulted and Agree with Plan of Care Patient      Patient will benefit from skilled therapeutic intervention in order to improve the following deficits and impairments:     Visit Diagnosis: Right shoulder pain, unspecified chronicity  Stiffness of right shoulder, not elsewhere classified     Problem List Patient Active Problem List   Diagnosis Date Noted  . Pain in joint of right shoulder 10/22/2015  . PAF (paroxysmal atrial fibrillation) ([place]) 09/29/2015  . Lichen planus 29/92/4268  . Healthcare maintenance 06/20/2015  . Seborrheic keratosis 06/20/2015  . Memory loss 01/30/2015  . Simple febrile convulsions ([place]) 10/21/2014  . Hyperammonemia ([place]) 10/03/2014  . Chronic kidney disease, stage III (moderate)   . Chronic diastolic CHF (congestive heart failure) ([place]) 10/03/2013  . DM (diabetes mellitus), type 2, uncontrolled, with renal complications ([place]) 34/19/6222  . HTN (hypertension), benign 03/02/2012  . 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

## 2015-11-14 ENCOUNTER — Ambulatory Visit: Payer: Medicare Other | Admitting: Physical Therapy

## 2015-11-14 DIAGNOSIS — M25511 Pain in right shoulder: Secondary | ICD-10-CM

## 2015-11-14 DIAGNOSIS — M25611 Stiffness of right shoulder, not elsewhere classified: Secondary | ICD-10-CM

## 2015-11-14 NOTE — Patient Instructions (Signed)
Over Head Pull: Narrow Grip       On back, knees bent, feet flat, band across thighs, elbows straight but relaxed. Pull hands apart (start). Keeping elbows straight, bring arms up and over head, hands toward floor. Keep pull steady on band. Hold momentarily. Return slowly, keeping pull steady, back to start. Repeat _3-5__ times. Band color __Yellow____   Side Pull: Double Arm   On back, knees bent, feet flat. Arms perpendicular to body, shoulder level, elbows straight but relaxed. Pull arms out to sides, elbows straight. Resistance band comes across collarbones, hands toward floor. Hold momentarily. Slowly return to starting position. Repeat _10__ times. Band color __RED___   Sash     Shoulder Rotation: Double Arm   On back, knees bent, feet flat, elbows tucked at sides, bent 90, hands palms up. Pull hands apart and down toward floor, keeping elbows near sides. Hold momentarily. Slowly return to starting position. Repeat _1-__ times. Band color ___yellow___

## 2015-11-14 NOTE — Therapy (Signed)
[place], [place], [address] Phone: [phone]   Fax:  [phone]  Physical Therapy Treatment  Patient Details  Name: [name] MRN: [id number] Date of Birth: [birth year] Referring Provider: [name], DO  Encounter Date: 11/14/2015      PT End of Session - 11/14/15 1025    Visit Number 5   Number of Visits 16   Date for PT Re-Evaluation 12/29/15   PT Start Time 0936   PT Stop Time 1026   PT Time Calculation (min) 50 min   Activity Tolerance Patient tolerated treatment well   Behavior During Therapy [facility] for tasks assessed/performed      Past Medical History:  Diagnosis Date  . CHF (congestive heart failure) ([place])   . Chronic anemia   . Chronic diastolic CHF (congestive heart failure) ([place]) 10/03/2013  . Chronic GI bleeding    [name] 11/29/2014  . Family history of anesthesia complication    "niece has a hard time coming out" (09/15/2012)  . Frequent nosebleeds    chronic  . Gastric AV malformation    [name] 11/29/2014  . GERD (gastroesophageal reflux disease)   . Heart murmur   . History of blood transfusion "several"  . History of epistaxis   . HTN (hypertension), benign 03/02/2012  . Hyperlipidemia   . Iron deficiency anemia    chronic infusions"  . Lichen planus    Both lower extremities  . 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 Pneumonia 1990's X 2  . Seizures ([place]) 09/2014  . Telangiectasia    Gastric   . 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    Past Surgical History:  Procedure Laterality Date  . CATARACT EXTRACTION     "I think it was just one eye"  . ESOPHAGOGASTRODUODENOSCOPY  02/26/2011   Procedure: ESOPHAGOGASTRODUODENOSCOPY (EGD);  Surgeon: [name], MD;  Location: [name];  Service: Endoscopy;  Laterality: N/A;  . ESOPHAGOGASTRODUODENOSCOPY N/A 11/08/2012   Procedure: ESOPHAGOGASTRODUODENOSCOPY (EGD);  Surgeon: [name], MD;  Location: [name];  Service: Endoscopy;  Laterality: N/A;  . ESOPHAGOGASTRODUODENOSCOPY N/A 10/04/2013   Procedure: ESOPHAGOGASTRODUODENOSCOPY (EGD);  Surgeon: Winfield Cunas., MD;  Location: [name];  Service: Endoscopy;  Laterality: N/A;  with APC on stand-by  . ESOPHAGOGASTRODUODENOSCOPY N/A 07/06/2014   Procedure: ESOPHAGOGASTRODUODENOSCOPY (EGD);  Surgeon: Clarene Essex, MD;  Location: [name];  Service: Endoscopy;  Laterality: N/A;  . ESOPHAGOGASTRODUODENOSCOPY N/A 09/05/2014   Procedure: ESOPHAGOGASTRODUODENOSCOPY (EGD);  Surgeon: Laurence Spates, MD;  Location: [name];  Service: Endoscopy;  Laterality: N/A;  APC on standby to control bleeding  . ESOPHAGOGASTRODUODENOSCOPY N/A 11/29/2014   Procedure: ESOPHAGOGASTRODUODENOSCOPY (EGD);  Surgeon: [name], MD;  Location: [facility] ENDOSCOPY;  Service: Endoscopy;  Laterality: N/A;  . ESOPHAGOGASTRODUODENOSCOPY N/A 09/28/2015   Procedure: ESOPHAGOGASTRODUODENOSCOPY (EGD);  Surgeon: Clarene Essex, MD;  Location[facility] ENDOSCOPY;  Service: Endoscopy;  Laterality: N/A;  . ESOPHAGOGASTRODUODENOSCOPY ENDOSCOPY  08/19/2006   with laser treatment  . HOT HEMOSTASIS N/A 07/06/2014   Procedure: HOT HEMOSTASIS (ARGON PLASMA COAGULATION/BICAP);  Surgeon: Clarene Essex, MD;  Location: [name];  Service: Endoscopy;  Laterality: N/A;  . HOT HEMOSTASIS N/A 09/28/2015   Procedure: HOT HEMOSTASIS (ARGON PLASMA COAGULATION/BICAP);  Surgeon: Clarene Essex, MD;  Location: [facility] ENDOSCOPY;  Service: Endoscopy;  Laterality: N/A;  . NASAL HEMORRHAGE CONTROL     "for bleeding"   . SAVORY DILATION  02/26/2011   Procedure: SAVORY DILATION;  Surgeon: [name], MD;  Location: [name];  Service: Endoscopy;  Laterality: N/A;  c-arm needed    There were no vitals filed for this visit.      Subjective Assessment - 11/14/15 0941    Subjective I was able to sleep on my shoulder a [place] last night.  Pain is less in shoulder.  No # given when  asked.    Currently in Pain? Yes   Pain Score --  mild/moderate   Pain Location Shoulder                         [facility] Adult PT Treatment/Exercise - 11/14/15 0001      Shoulder Exercises: Supine   Horizontal ABduction 5 reps   Horizontal ABduction Limitations less pain than sitting, HEP   External Rotation 5 reps   Theraband Level (Shoulder External Rotation) Level 1 (Yellow)   External Rotation Limitations less pain than sitting.  HEP   Flexion 5 reps   Theraband Level (Shoulder Flexion) Level 1 (Yellow)   Shoulder Flexion Weight (lbs) less pain than sitting, narrow grip,  smaller range, HEP     Shoulder Exercises: Seated   Retraction 10 reps   Theraband Level (Shoulder Horizontal ABduction) Level 2 (Red)   Horizontal ABduction Weight (lbs) level 2    External Rotation 5 reps   External Rotation Limitations cues painful   Other Seated Exercises seated UE ranger type ex with pole 10 X flex, extension circles,,  IR     Moist Heat Therapy   Number Minutes Moist Heat 10 Minutes   Moist Heat Location Shoulder  away from port.  anterior/posterior shoulder     Manual Therapy   Manual Therapy Soft tissue mobilization   Manual therapy comments manual with hand and instrument assist.  Light pressure only. tissue softened however remains tender,  AA shoulder flexion in sidelying 5X.                  PT Short Term Goals - 11/14/15 0945      PT SHORT TERM GOAL #1   Title Pt will be independent with her HEP in order to improve functional mobility.    Baseline cues needede ,  modifications   Time 4   Period Weeks   Status On-going     PT SHORT TERM GOAL #2   Title Pt will be able to demonstrate bilateral arm swing with gait.    Baseline purse limiting today.   Time 4   Period Weeks   Status On-going           PT Long Term Goals - 10/29/15 1317      PT LONG TERM GOAL #1   Title pt will improve her FOTO score from 38% limitation to </= 35%  limitation   Baseline 38% limitation   Time 8   Period Weeks     PT LONG TERM GOAL #2   Title Further assess pt's bilateral LE pain   Time 8   Period Weeks   Status New     PT LONG TERM GOAL #3   Title Pt will improve R shoulder strength to >/=4/5 in order to improve functional mobiltiy, reaching, and lifting.    Time 8   Period Weeks   Status New               Plan - 11/14/15 1026    Clinical Impression Statement Sitting band exercises peformed  with pain today when reviewing her current home exercise program,  Modification: for bands supine HEP.  Less pain.  Tape helpful. Patient feels shw does not have a problem with arm swing with walking.  Purse limited arm swing at end of swssion.    PT Next Visit Plan  No ice or heat over port. Assess for scoliosis, Shoulder ROM, strengthening, gait with arm swing  (perhaps leg strengthening could be added to assist with gait)GIVE UPPER TRAP STRETCH Check arm swing without purse.   PT Home Exercise Plan supine scapular stabilization with bands.    Consulted and Agree with Plan of Care Patient      Patient will benefit from skilled therapeutic intervention in order to improve the following deficits and impairments:  Abnormal gait, Difficulty walking, Impaired UE functional use, Pain, Postural dysfunction, Decreased strength, Decreased mobility, Impaired flexibility, Improper body mechanics, Decreased activity tolerance, Decreased balance, Impaired perceived functional ability  Visit Diagnosis: Right shoulder pain, unspecified chronicity  Stiffness of right shoulder, not elsewhere classified     Problem List Patient Active Problem List   Diagnosis Date Noted  . Pain in joint of right shoulder 10/22/2015  . PAF (paroxysmal atrial fibrillation) ([place]) 09/29/2015  . Lichen planus 09/62/8366  . Healthcare maintenance 06/20/2015  . Seborrheic keratosis 06/20/2015  . Memory loss 01/30/2015  . Simple febrile convulsions ([place])  10/21/2014  . Hyperammonemia ([place]) 10/03/2014  . Chronic kidney disease, stage III (moderate)   . Chronic diastolic CHF (congestive heart failure) ([place]) 10/03/2013  . DM (diabetes mellitus), type 2, uncontrolled, with renal complications ([place]) 29/47/6546  . HTN (hypertension), benign 03/02/2012  . 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 [place], [place], [address] Phone: [phone]   Fax:  [phone]  Name: [name] MRN: [id number] Date of Birth: 03/03/1940

## 2015-11-18 ENCOUNTER — Ambulatory Visit: Payer: Medicare Other | Admitting: Physical Therapy

## 2015-11-18 DIAGNOSIS — M25511 Pain in right shoulder: Secondary | ICD-10-CM | POA: Diagnosis not present

## 2015-11-18 DIAGNOSIS — M25611 Stiffness of right shoulder, not elsewhere classified: Secondary | ICD-10-CM

## 2015-11-18 NOTE — Therapy (Signed)
[place], [place], [address] Phone: [phone]   Fax:  [phone]  Physical Therapy Treatment  Patient Details  Name: [name] MRN: [id number] Date of Birth: 02-Nov-1940 Referring Provider: [name], DO  Encounter Date: 11/18/2015      PT End of Session - 11/18/15 1851    Visit Number 6   Number of Visits 16   Date for PT Re-Evaluation 12/29/15   PT Start Time 0949   PT Stop Time 1030   PT Time Calculation (min) 41 min   Activity Tolerance Patient tolerated treatment well   Behavior During Therapy [facility] for tasks assessed/performed      Past Medical History:  Diagnosis Date  . CHF (congestive heart failure) ([place])   . Chronic anemia   . Chronic diastolic CHF (congestive heart failure) ([place]) 10/03/2013  . Chronic GI bleeding    [name] 11/29/2014  . Family history of anesthesia complication    "niece has a hard time coming out" (09/15/2012)  . Frequent nosebleeds    chronic  . Gastric AV malformation    [name] 11/29/2014  . GERD (gastroesophageal reflux disease)   . Heart murmur   . History of blood transfusion "several"  . History of epistaxis   . HTN (hypertension), benign 03/02/2012  . Hyperlipidemia   . Iron deficiency anemia    chronic infusions"  . Lichen planus    Both lower extremities  . 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 Pneumonia 1990's X 2  . Seizures ([place]) 09/2014  . Telangiectasia    Gastric   . 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    Past Surgical History:  Procedure Laterality Date  . CATARACT EXTRACTION     "I think it was just one eye"  . ESOPHAGOGASTRODUODENOSCOPY  02/26/2011   Procedure: ESOPHAGOGASTRODUODENOSCOPY (EGD);  Surgeon: [name], MD;  Location: [name];  Service: Endoscopy;  Laterality: N/A;  . ESOPHAGOGASTRODUODENOSCOPY N/A 11/08/2012   Procedure: ESOPHAGOGASTRODUODENOSCOPY (EGD);  Surgeon: [name], MD;  Location: [name];  Service: Endoscopy;  Laterality: N/A;  . ESOPHAGOGASTRODUODENOSCOPY N/A 10/04/2013   Procedure: ESOPHAGOGASTRODUODENOSCOPY (EGD);  Surgeon: Winfield Cunas., MD;  Location: [name];  Service: Endoscopy;  Laterality: N/A;  with APC on stand-by  . ESOPHAGOGASTRODUODENOSCOPY N/A 07/06/2014   Procedure: ESOPHAGOGASTRODUODENOSCOPY (EGD);  Surgeon: Clarene Essex, MD;  Location: [name];  Service: Endoscopy;  Laterality: N/A;  . ESOPHAGOGASTRODUODENOSCOPY N/A 09/05/2014   Procedure: ESOPHAGOGASTRODUODENOSCOPY (EGD);  Surgeon: Laurence Spates, MD;  Location: [name];  Service: Endoscopy;  Laterality: N/A;  APC on standby to control bleeding  . ESOPHAGOGASTRODUODENOSCOPY N/A 11/29/2014   Procedure: ESOPHAGOGASTRODUODENOSCOPY (EGD);  Surgeon: [name], MD;  Location: [facility] ENDOSCOPY;  Service: Endoscopy;  Laterality: N/A;  . ESOPHAGOGASTRODUODENOSCOPY N/A 09/28/2015   Procedure: ESOPHAGOGASTRODUODENOSCOPY (EGD);  Surgeon: Clarene Essex, MD;  Location: [facility] ENDOSCOPY;  Service: Endoscopy;  Laterality: N/A;  . ESOPHAGOGASTRODUODENOSCOPY ENDOSCOPY  08/19/2006   with laser treatment  . HOT HEMOSTASIS N/A 07/06/2014   Procedure: HOT HEMOSTASIS (ARGON PLASMA COAGULATION/BICAP);  Surgeon: Clarene Essex, MD;  Location: [name];  Service: Endoscopy;  Laterality: N/A;  . HOT HEMOSTASIS N/A 09/28/2015   Procedure: HOT HEMOSTASIS (ARGON PLASMA COAGULATION/BICAP);  Surgeon: Clarene Essex, MD;  Location: [facility] ENDOSCOPY;  Service: Endoscopy;  Laterality: N/A;  . NASAL HEMORRHAGE CONTROL     "for bleeding"   . SAVORY DILATION  02/26/2011   Procedure: SAVORY DILATION;  Surgeon: [name], MD;  Location: [name];  Service: Endoscopy;  Laterality: N/A;  c-arm needed    There were no vitals filed for this visit.      Subjective Assessment - 11/18/15 1000    Subjective I have been doing my exercises .  Mild shoulder pain this morning,  Has right leg pain 7/10  (Longstanding)  better with rest.  It feels like it does not want to move with walking when she first gets up.    Currently in Pain? Yes   Pain Score --  mild   Pain Location Shoulder   Pain Orientation Right   Pain Descriptors / Indicators Aching   Pain Type Chronic pain   Pain Frequency Intermittent   Aggravating Factors  reaching   Pain Relieving Factors using a pillow, rest   Effect of Pain on Daily Activities extra time to do cleaning    Pain Score 7   Pain Location Leg   Pain Orientation Right   Pain Descriptors / Indicators Aching   Aggravating Factors  walking            [facility] PT Assessment - 11/18/15 0001      AROM   Overall AROM  --  122                     [facility] Adult PT Treatment/Exercise - 11/18/15 0001      Shoulder Exercises: Supine   Horizontal ABduction 10 reps   Theraband Level (Shoulder Horizontal ABduction) Level 1 (Yellow)   External Rotation 10 reps   Theraband Level (Shoulder External Rotation) Level 1 (Yellow)   Flexion 10 reps   Theraband Level (Shoulder Flexion) Level 1 (Yellow)   Shoulder Flexion Weight (lbs) smaller range, cues   Other Supine Exercises 10 X 5 seconds isometric scapular stabilization   Other Supine Exercises Supine scapular stabilization series 10 X each, yellow band uses,  moderate cues,       Shoulder Exercises: Seated   Retraction 10 reps   Theraband Level (Shoulder Horizontal ABduction) Level 1 (Yellow)   Horizontal ABduction Limitations 10     Moist Heat Therapy   Number Minutes Moist Heat 15 Minutes   Moist Heat Location Shoulder     Manual Therapy   Manual Therapy Soft tissue mobilization   Manual therapy comments light soft tissue work shoulder, peri scapular                  PT Short Term Goals - 11/18/15 1854      PT SHORT TERM GOAL #1   Title Pt will be independent with her HEP in order to improve functional mobility.    Baseline cues needed,     Time 4   Period Weeks    Status On-going     PT SHORT TERM GOAL #2   Title Pt will be able to demonstrate bilateral arm swing with gait.    Baseline good swings   Time 4   Period Weeks   Status Achieved           PT Long Term Goals - 10/29/15 1317      PT LONG TERM GOAL #1   Title pt will improve her FOTO score from 38% limitation to </= 35% limitation   Baseline 38% limitation   Time 8   Period Weeks     PT LONG TERM GOAL #2   Title Further assess pt's bilateral LE pain   Time  8   Period Weeks   Status New     PT LONG TERM GOAL #3   Title Pt will improve R shoulder strength to >/=4/5 in order to improve functional mobiltiy, reaching, and lifting.    Time 8   Period Weeks   Status New               Plan - 11/18/15 1852    Clinical Impression Statement 122 Flexion AROM Right shoulder.  Improving.  Able to increase reps with exercises.  Pain 2-3/10 post exercise.  STG#2 met.   PT Home Exercise Plan supine scapular stabilization with bands. continue   Consulted and Agree with Plan of Care Patient      Patient will benefit from skilled therapeutic intervention in order to improve the following deficits and impairments:  Abnormal gait, Difficulty walking, Impaired UE functional use, Pain, Postural dysfunction, Decreased strength, Decreased mobility, Impaired flexibility, Improper body mechanics, Decreased activity tolerance, Decreased balance, Impaired perceived functional ability  Visit Diagnosis: Right shoulder pain, unspecified chronicity  Stiffness of right shoulder, not elsewhere classified     Problem List Patient Active Problem List   Diagnosis Date Noted  . Pain in joint of right shoulder 10/22/2015  . PAF (paroxysmal atrial fibrillation) ([place]) 09/29/2015  . Lichen planus 38/38/1840  . Healthcare maintenance 06/20/2015  . Seborrheic keratosis 06/20/2015  . Memory loss 01/30/2015  . Simple febrile convulsions ([place]) 10/21/2014  . Hyperammonemia ([place]) 10/03/2014  .  Chronic kidney disease, stage III (moderate)   . Chronic diastolic CHF (congestive heart failure) ([place]) 10/03/2013  . DM (diabetes mellitus), type 2, uncontrolled, with renal complications ([place]) 37/54/3606  . HTN (hypertension), benign 03/02/2012  . 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

## 2015-11-19 ENCOUNTER — Other Ambulatory Visit (INDEPENDENT_AMBULATORY_CARE_PROVIDER_SITE_OTHER): Payer: Medicare Other

## 2015-11-19 DIAGNOSIS — D649 Anemia, unspecified: Secondary | ICD-10-CM

## 2015-11-19 DIAGNOSIS — I780 Hereditary hemorrhagic telangiectasia: Secondary | ICD-10-CM | POA: Diagnosis not present

## 2015-11-19 DIAGNOSIS — D62 Acute posthemorrhagic anemia: Secondary | ICD-10-CM

## 2015-11-19 DIAGNOSIS — K31819 Angiodysplasia of stomach and duodenum without bleeding: Secondary | ICD-10-CM

## 2015-11-19 LAB — SAMPLE TO BLOOD BANK

## 2015-11-19 LAB — CBC WITH DIFFERENTIAL/PLATELET
BASOS ABS: 0 10*3/uL (ref 0.0–0.1)
BASOS PCT: 1 %
EOS ABS: 0.4 10*3/uL (ref 0.0–0.7)
EOS PCT: 10 %
HCT: 26.8 % — ABNORMAL LOW (ref 36.0–46.0)
Hemoglobin: 8.4 g/dL — ABNORMAL LOW (ref 12.0–15.0)
LYMPHS PCT: 14 %
Lymphs Abs: 0.5 10*3/uL — ABNORMAL LOW (ref 0.7–4.0)
MCH: 28.2 pg (ref 26.0–34.0)
MCHC: 31.3 g/dL (ref 30.0–36.0)
MCV: 89.9 fL (ref 78.0–100.0)
Monocytes Absolute: 0.3 10*3/uL (ref 0.1–1.0)
Monocytes Relative: 8 %
Neutro Abs: 2.6 10*3/uL (ref 1.7–7.7)
Neutrophils Relative %: 67 %
PLATELETS: 179 10*3/uL (ref 150–400)
RBC: 2.98 MIL/uL — AB (ref 3.87–5.11)
RDW: 14.4 % (ref 11.5–15.5)
WBC: 3.9 10*3/uL — AB (ref 4.0–10.5)

## 2015-11-19 LAB — FERRITIN: FERRITIN: 7 ng/mL — AB (ref 11–307)

## 2015-11-20 ENCOUNTER — Other Ambulatory Visit: Payer: Self-pay | Admitting: Oncology

## 2015-11-20 ENCOUNTER — Ambulatory Visit: Payer: Medicare Other | Admitting: Physical Therapy

## 2015-11-20 DIAGNOSIS — K31819 Angiodysplasia of stomach and duodenum without bleeding: Secondary | ICD-10-CM

## 2015-11-20 DIAGNOSIS — I780 Hereditary hemorrhagic telangiectasia: Secondary | ICD-10-CM

## 2015-11-20 DIAGNOSIS — M25511 Pain in right shoulder: Secondary | ICD-10-CM | POA: Diagnosis not present

## 2015-11-21 ENCOUNTER — Telehealth: Payer: Self-pay | Admitting: *Deleted

## 2015-11-21 ENCOUNTER — Ambulatory Visit: Payer: Medicare Other | Admitting: Physical Therapy

## 2015-11-21 ENCOUNTER — Other Ambulatory Visit: Payer: Medicare Other

## 2015-11-21 DIAGNOSIS — M25511 Pain in right shoulder: Secondary | ICD-10-CM | POA: Diagnosis not present

## 2015-11-21 DIAGNOSIS — M25611 Stiffness of right shoulder, not elsewhere classified: Secondary | ICD-10-CM

## 2015-11-21 NOTE — Telephone Encounter (Signed)
-----   Message from [name], MD sent at 11/19/2015  4:34 PM EDT ----- Call pt: hemoglobin 8.4.  Please get her on schedule for feraheme weekly x 2 doses and 2 unit blood transfusion first available day at [facility] short stay

## 2015-11-21 NOTE — Telephone Encounter (Signed)
Dr [name] pt will be schedule at the [facility] for Friday and they will call her.

## 2015-11-22 NOTE — Therapy (Signed)
[place], [place], [address] Phone: [phone]   Fax:  [phone]  Physical Therapy Treatment  Patient Details  Name: [name] MRN: [id number] Date of Birth: [birth year] Referring Provider: [name], DO  Encounter Date: 11/21/2015      PT End of Session - 11/21/15 1502    Visit Number 7   Number of Visits 16   Date for PT Re-Evaluation 12/29/15   Authorization Type KX modifier after 15 visit, G -codes   PT Start Time 0215  15 minutes late   PT Stop Time 0315   PT Time Calculation (min) 60 min      Past Medical History:  Diagnosis Date  . CHF (congestive heart failure) ([place])   . Chronic anemia   . Chronic diastolic CHF (congestive heart failure) ([place]) 10/03/2013  . Chronic GI bleeding    [name] 11/29/2014  . Family history of anesthesia complication    "niece has a hard time coming out" (09/15/2012)  . Frequent nosebleeds    chronic  . Gastric AV malformation    [name] 11/29/2014  . GERD (gastroesophageal reflux disease)   . Heart murmur   . History of blood transfusion "several"  . History of epistaxis   . HTN (hypertension), benign 03/02/2012  . Hyperlipidemia   . Iron deficiency anemia    chronic infusions"  . Lichen planus    Both lower extremities  . 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 Pneumonia 1990's X 2  . Seizures ([place]) 09/2014  . Telangiectasia    Gastric   . 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    Past Surgical History:  Procedure Laterality Date  . CATARACT EXTRACTION     "I think it was just one eye"  . ESOPHAGOGASTRODUODENOSCOPY  02/26/2011   Procedure: ESOPHAGOGASTRODUODENOSCOPY (EGD);  Surgeon: [name], MD;  Location: [name];  Service: Endoscopy;  Laterality: N/A;  . ESOPHAGOGASTRODUODENOSCOPY N/A 11/08/2012   Procedure: ESOPHAGOGASTRODUODENOSCOPY (EGD);  Surgeon: [name], MD;  Location: [name];   Service: Endoscopy;  Laterality: N/A;  . ESOPHAGOGASTRODUODENOSCOPY N/A 10/04/2013   Procedure: ESOPHAGOGASTRODUODENOSCOPY (EGD);  Surgeon: Winfield Cunas., MD;  Location: [name];  Service: Endoscopy;  Laterality: N/A;  with APC on stand-by  . ESOPHAGOGASTRODUODENOSCOPY N/A 07/06/2014   Procedure: ESOPHAGOGASTRODUODENOSCOPY (EGD);  Surgeon: Clarene Essex, MD;  Location: [name];  Service: Endoscopy;  Laterality: N/A;  . ESOPHAGOGASTRODUODENOSCOPY N/A 09/05/2014   Procedure: ESOPHAGOGASTRODUODENOSCOPY (EGD);  Surgeon: Laurence Spates, MD;  Location: [name];  Service: Endoscopy;  Laterality: N/A;  APC on standby to control bleeding  . ESOPHAGOGASTRODUODENOSCOPY N/A 11/29/2014   Procedure: ESOPHAGOGASTRODUODENOSCOPY (EGD);  Surgeon: [name], MD;  Location: [facility] ENDOSCOPY;  Service: Endoscopy;  Laterality: N/A;  . ESOPHAGOGASTRODUODENOSCOPY N/A 09/28/2015   Procedure: ESOPHAGOGASTRODUODENOSCOPY (EGD);  Surgeon: Clarene Essex, MD;  Location: [facility] ENDOSCOPY;  Service: Endoscopy;  Laterality: N/A;  . ESOPHAGOGASTRODUODENOSCOPY ENDOSCOPY  08/19/2006   with laser treatment  . HOT HEMOSTASIS N/A 07/06/2014   Procedure: HOT HEMOSTASIS (ARGON PLASMA COAGULATION/BICAP);  Surgeon: Clarene Essex, MD;  Location: [name];  Service: Endoscopy;  Laterality: N/A;  . HOT HEMOSTASIS N/A 09/28/2015   Procedure: HOT HEMOSTASIS (ARGON PLASMA COAGULATION/BICAP);  Surgeon: Clarene Essex, MD;  Location: [facility] ENDOSCOPY;  Service: Endoscopy;  Laterality: N/A;  . NASAL HEMORRHAGE CONTROL     "for bleeding"   . SAVORY DILATION  02/26/2011   Procedure: SAVORY DILATION;  Surgeon:  [name], MD;  Location: [name];  Service: Endoscopy;  Laterality: N/A;  c-arm needed    There were no vitals filed for this visit.      Subjective Assessment - 11/21/15 1432    Subjective The shoulder only hurts when I lay on it.    Currently in Pain? Yes   Pain Score 4    Pain Location Shoulder   Pain Orientation Right    Pain Descriptors / Indicators --  pulling   Aggravating Factors  laying on it, reaching    Pain Relieving Factors using a pillow, rest                          [facility] Adult PT Treatment/Exercise - 11/22/15 0001      Shoulder Exercises: Supine   Horizontal ABduction 20 reps   Theraband Level (Shoulder Horizontal ABduction) Level 1 (Yellow)   External Rotation 20 reps   Theraband Level (Shoulder External Rotation) Level 1 (Yellow)   Other Supine Exercises supine pullovers yellow band x 10      Shoulder Exercises: Seated   Retraction 10 reps  max cues     Moist Heat Therapy   Number Minutes Moist Heat 15 Minutes   Moist Heat Location Shoulder                  PT Short Term Goals - 11/18/15 1854      PT SHORT TERM GOAL #1   Title Pt will be independent with her HEP in order to improve functional mobility.    Baseline cues needed,     Time 4   Period Weeks   Status On-going     PT SHORT TERM GOAL #2   Title Pt will be able to demonstrate bilateral arm swing with gait.    Baseline good swings   Time 4   Period Weeks   Status Achieved           PT Long Term Goals - 10/29/15 1317      PT LONG TERM GOAL #1   Title pt will improve her FOTO score from 38% limitation to </= 35% limitation   Baseline 38% limitation   Time 8   Period Weeks     PT LONG TERM GOAL #2   Title Further assess pt's bilateral LE pain   Time 8   Period Weeks   Status New     PT LONG TERM GOAL #3   Title Pt will improve R shoulder strength to >/=4/5 in order to improve functional mobiltiy, reaching, and lifting.    Time 8   Period Weeks   Status New               Plan - 11/21/15 1503    Clinical Impression Statement Pt late today. Spent time discussing pain scale. She reports " a [place] pain" and rates 4/10 on average that can worsen with lying on right shoulder.    PT Next Visit Plan  MMT; No ice or heat over port. Assess for scoliosis, Shoulder ROM,  strengthening, ; PT TO ADD LE TO POC ON OCT 18th APPT; GIVE UPPER TRAP STRETCH    PT Home Exercise Plan supine scapular stabilization with bands. continue      Patient will benefit from skilled therapeutic intervention in order to improve the following deficits and impairments:  Abnormal gait, Difficulty walking, Impaired UE functional use, Pain, Postural dysfunction, Decreased strength, Decreased mobility,  Impaired flexibility, Improper body mechanics, Decreased activity tolerance, Decreased balance, Impaired perceived functional ability  Visit Diagnosis: Right shoulder pain, unspecified chronicity  Stiffness of right shoulder, not elsewhere classified     Problem List Patient Active Problem List   Diagnosis Date Noted  . Pain in joint of right shoulder 10/22/2015  . PAF (paroxysmal atrial fibrillation) ([place]) 09/29/2015  . Lichen planus 37/16/9678  . Healthcare maintenance 06/20/2015  . Seborrheic keratosis 06/20/2015  . Memory loss 01/30/2015  . Simple febrile convulsions ([place]) 10/21/2014  . Hyperammonemia ([place]) 10/03/2014  . Chronic kidney disease, stage III (moderate)   . Chronic diastolic CHF (congestive heart failure) ([place]) 10/03/2013  . DM (diabetes mellitus), type 2, uncontrolled, with renal complications ([place]) 93/81/0175  . HTN (hypertension), benign 03/02/2012  . 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

## 2015-11-25 ENCOUNTER — Telehealth: Payer: Self-pay | Admitting: *Deleted

## 2015-11-25 ENCOUNTER — Ambulatory Visit: Payer: Medicare Other | Admitting: Physical Therapy

## 2015-11-25 ENCOUNTER — Other Ambulatory Visit: Payer: Self-pay | Admitting: Oncology

## 2015-11-25 DIAGNOSIS — M25611 Stiffness of right shoulder, not elsewhere classified: Secondary | ICD-10-CM

## 2015-11-25 DIAGNOSIS — K31811 Angiodysplasia of stomach and duodenum with bleeding: Secondary | ICD-10-CM

## 2015-11-25 DIAGNOSIS — I780 Hereditary hemorrhagic telangiectasia: Secondary | ICD-10-CM

## 2015-11-25 DIAGNOSIS — D500 Iron deficiency anemia secondary to blood loss (chronic): Secondary | ICD-10-CM

## 2015-11-25 DIAGNOSIS — M25511 Pain in right shoulder: Secondary | ICD-10-CM | POA: Diagnosis not present

## 2015-11-25 MED ORDER — SODIUM CHLORIDE 0.9 % IV SOLN: Freq: Once | INTRAVENOUS | Status: DC

## 2015-11-25 NOTE — Therapy (Signed)
[place], [place], [address] Phone: [phone]   Fax:  [phone]  Physical Therapy Treatment  Patient Details  Name: [name] MRN: [id number] Date of Birth: 05/22/1940 Referring Provider: [name], DO  Encounter Date: 11/25/2015      PT End of Session - 11/25/15 1811    Visit Number 7   Number of Visits 16   Date for PT Re-Evaluation 12/29/15   PT Start Time 0945   PT Stop Time 1030   PT Time Calculation (min) 45 min   Activity Tolerance Patient tolerated treatment well   Behavior During Therapy [facility] for tasks assessed/performed      Past Medical History:  Diagnosis Date  . CHF (congestive heart failure) ([place])   . Chronic anemia   . Chronic diastolic CHF (congestive heart failure) ([place]) 10/03/2013  . Chronic GI bleeding    [name] 11/29/2014  . Family history of anesthesia complication    "niece has a hard time coming out" (09/15/2012)  . Frequent nosebleeds    chronic  . Gastric AV malformation    [name] 11/29/2014  . GERD (gastroesophageal reflux disease)   . Heart murmur   . History of blood transfusion "several"  . History of epistaxis   . HTN (hypertension), benign 03/02/2012  . Hyperlipidemia   . Iron deficiency anemia    chronic infusions"  . Lichen planus    Both lower extremities  . 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 Pneumonia 1990's X 2  . Seizures ([place]) 09/2014  . Telangiectasia    Gastric   . 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    Past Surgical History:  Procedure Laterality Date  . CATARACT EXTRACTION     "I think it was just one eye"  . ESOPHAGOGASTRODUODENOSCOPY  02/26/2011   Procedure: ESOPHAGOGASTRODUODENOSCOPY (EGD);  Surgeon: [name], MD;  Location: [name];  Service: Endoscopy;  Laterality: N/A;  . ESOPHAGOGASTRODUODENOSCOPY N/A 11/08/2012   Procedure: ESOPHAGOGASTRODUODENOSCOPY (EGD);  Surgeon:  [name], MD;  Location: [name];  Service: Endoscopy;  Laterality: N/A;  . ESOPHAGOGASTRODUODENOSCOPY N/A 10/04/2013   Procedure: ESOPHAGOGASTRODUODENOSCOPY (EGD);  Surgeon: Winfield Cunas., MD;  Location: [name];  Service: Endoscopy;  Laterality: N/A;  with APC on stand-by  . ESOPHAGOGASTRODUODENOSCOPY N/A 07/06/2014   Procedure: ESOPHAGOGASTRODUODENOSCOPY (EGD);  Surgeon: Clarene Essex, MD;  Location: [name];  Service: Endoscopy;  Laterality: N/A;  . ESOPHAGOGASTRODUODENOSCOPY N/A 09/05/2014   Procedure: ESOPHAGOGASTRODUODENOSCOPY (EGD);  Surgeon: Laurence Spates, MD;  Location: [name];  Service: Endoscopy;  Laterality: N/A;  APC on standby to control bleeding  . ESOPHAGOGASTRODUODENOSCOPY N/A 11/29/2014   Procedure: ESOPHAGOGASTRODUODENOSCOPY (EGD);  Surgeon: [name], MD;  Location: [facility] ENDOSCOPY;  Service: Endoscopy;  Laterality: N/A;  . ESOPHAGOGASTRODUODENOSCOPY N/A 09/28/2015   Procedure: ESOPHAGOGASTRODUODENOSCOPY (EGD);  Surgeon: Clarene Essex, MD;  Location: [facility] ENDOSCOPY;  Service: Endoscopy;  Laterality: N/A;  . ESOPHAGOGASTRODUODENOSCOPY ENDOSCOPY  08/19/2006   with laser treatment  . HOT HEMOSTASIS N/A 07/06/2014   Procedure: HOT HEMOSTASIS (ARGON PLASMA COAGULATION/BICAP);  Surgeon: Clarene Essex, MD;  Location: [name];  Service: Endoscopy;  Laterality: N/A;  . HOT HEMOSTASIS N/A 09/28/2015   Procedure: HOT HEMOSTASIS (ARGON PLASMA COAGULATION/BICAP);  Surgeon: Clarene Essex, MD;  Location: [facility] ENDOSCOPY;  Service: Endoscopy;  Laterality: N/A;  . NASAL HEMORRHAGE CONTROL     "for bleeding"   . SAVORY DILATION  02/26/2011   Procedure: SAVORY DILATION;  Surgeon: [name], MD;  Location: [name];  Service: Endoscopy;  Laterality: N/A;  c-arm needed    There were no vitals filed for this visit.      Subjective Assessment - 11/25/15 0947    Subjective 4-5/10.  sore.  I have been  doing  the exercises .  Shoulder not waking her up at night.    Currently in Pain? Yes   Pain Score 5    Pain Location Shoulder   Pain Orientation Right   Pain Descriptors / Indicators Aching   Aggravating Factors  laying on it.  Reaching   Pain Relieving Factors rest.    Effect of Pain on Daily Activities extra time for chores.     Pain Score 7   Pain Location Leg   Pain Orientation Right   Pain Descriptors / Indicators --  stiff   Pain Type Acute pain   Pain Frequency Intermittent   Aggravating Factors  walking   Pain Relieving Factors rest,  moving knee straight and flexed            [facility] PT Assessment - 11/25/15 0001      AROM   Overall AROM  --  127 flexion,  Abduction 108 AROM right                     [facility] Adult PT Treatment/Exercise - 11/25/15 0001      Shoulder Exercises: Supine   Horizontal ABduction Strengthening;Both;10 reps;Theraband  2 sets   Theraband Level (Shoulder Horizontal ABduction) Level 1 (Yellow)   External Rotation 10 reps   Flexion 10 reps  yellow band 10 X cues   Other Supine Exercises diagonals 10 X     Shoulder Exercises: Seated   Row 10 reps;Right  red band   External Rotation 10 reps   Internal Rotation 10 reps  pulling noted, red band   Other Seated Exercises pulleys, 5 minutes, flexion, scAPTION     Moist Heat Therapy   Number Minutes Moist Heat 15 Minutes   Moist Heat Location Shoulder                  PT Short Term Goals - 11/25/15 1815      PT SHORT TERM GOAL #1   Title Pt will be independent with her HEP in order to improve functional mobility.    Baseline cues needed,     Time 4   Period Weeks   Status On-going     PT SHORT TERM GOAL #2   Title Pt will be able to demonstrate bilateral arm swing with gait.    Baseline good swings   Time 4   Period Weeks   Status Achieved           PT Long Term Goals - 11/25/15 1815      PT LONG TERM GOAL #1   Title pt will improve her FOTO score from 38% limitation to </= 35% limitation   Time 8   Period  Weeks   Status Unable to assess     PT LONG TERM GOAL #2   Title Further assess pt's bilateral LE pain   Time 8   Period Weeks   Status Unable to assess     PT LONG TERM GOAL #3   Title Pt will improve R shoulder strength to >/=4/5 in order to improve functional mobiltiy, reaching, and lifting.    Time 8   Period Weeks   Status Unable  to assess               Plan - 11/25/15 1812    Clinical Impression Statement Short session dur to patient being late.  She hald mild pain st end of session.  AROM continues to improve with flexion 127 and abduction 108.  No new goals met.  She continues to need cues with her home exercises.   PT Next Visit Plan  MMT; No ice or heat over port. Assess for scoliosis, Shoulder ROM, strengthening, ; PT TO ADD LE TO POC ON OCT 18th APPT; GIVE UPPER TRAP STRETCH    PT Home Exercise Plan supine scapular stabilization with bands. continue   Consulted and Agree with Plan of Care Patient      Patient will benefit from skilled therapeutic intervention in order to improve the following deficits and impairments:  Abnormal gait, Difficulty walking, Impaired UE functional use, Pain, Postural dysfunction, Decreased strength, Decreased mobility, Impaired flexibility, Improper body mechanics, Decreased activity tolerance, Decreased balance, Impaired perceived functional ability  Visit Diagnosis: Right shoulder pain, unspecified chronicity  Stiffness of right shoulder, not elsewhere classified     Problem List Patient Active Problem List   Diagnosis Date Noted  . Pain in joint of right shoulder 10/22/2015  . PAF (paroxysmal atrial fibrillation) ([place]) 09/29/2015  . Lichen planus 98/07/9994  . Healthcare maintenance 06/20/2015  . Seborrheic keratosis 06/20/2015  . Memory loss 01/30/2015  . Simple febrile convulsions ([place]) 10/21/2014  . Hyperammonemia ([place]) 10/03/2014  . Chronic kidney disease, stage III (moderate)   . Chronic diastolic CHF (congestive  heart failure) ([place]) 10/03/2013  . DM (diabetes mellitus), type 2, uncontrolled, with renal complications ([place]) 72/27/7375  . HTN (hypertension), benign 03/02/2012  . 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 [place], [place], [address] Phone: [phone]   Fax:  [phone]  Name: SHANECE COCHRANE MRN: [id number] Date of Birth: 1940/08/27

## 2015-11-25 NOTE — Telephone Encounter (Signed)
Called pt - informed of appt at [facility] ; also called her husband (pt's request - not at home) and informed of pt's appt. Stated he will have her there tomorrow. Pt stated she has port-a-cath.  Dr [name] made awared.

## 2015-11-26 ENCOUNTER — Other Ambulatory Visit: Payer: Self-pay | Admitting: Oncology

## 2015-11-26 ENCOUNTER — Ambulatory Visit (HOSPITAL_COMMUNITY)
Admission: RE | Admit: 2015-11-26 | Discharge: 2015-11-26 | Disposition: A | Discharge status: Home / Self Care | Payer: Medicare Other | Source: Ambulatory Visit | Attending: Oncology | Admitting: Oncology

## 2015-11-26 DIAGNOSIS — K31819 Angiodysplasia of stomach and duodenum without bleeding: Secondary | ICD-10-CM

## 2015-11-26 DIAGNOSIS — I780 Hereditary hemorrhagic telangiectasia: Secondary | ICD-10-CM | POA: Diagnosis not present

## 2015-11-26 DIAGNOSIS — D62 Acute posthemorrhagic anemia: Secondary | ICD-10-CM | POA: Diagnosis present

## 2015-11-26 DIAGNOSIS — D500 Iron deficiency anemia secondary to blood loss (chronic): Secondary | ICD-10-CM

## 2015-11-26 DIAGNOSIS — K31811 Angiodysplasia of stomach and duodenum with bleeding: Secondary | ICD-10-CM

## 2015-11-26 LAB — PREPARE RBC (CROSSMATCH)

## 2015-11-26 LAB — HEMOGLOBIN AND HEMATOCRIT, BLOOD
HEMATOCRIT: 22.2 % — AB (ref 36.0–46.0)
Hemoglobin: 7.1 g/dL — ABNORMAL LOW (ref 12.0–15.0)

## 2015-11-26 MED ORDER — SODIUM CHLORIDE 0.9% FLUSH
10.0000 mL | INTRAVENOUS | Status: AC | PRN
  Administered 2015-11-26: 10 mL

## 2015-11-26 MED ORDER — SODIUM CHLORIDE 0.9 % IV SOLN
Freq: Once | INTRAVENOUS | Status: AC
  Administered 2015-11-26: 11:00:00 via INTRAVENOUS

## 2015-11-26 MED ORDER — HEPARIN SOD (PORK) LOCK FLUSH 100 UNIT/ML IV SOLN
500.0000 [IU] | INTRAVENOUS | Status: AC | PRN
  Administered 2015-11-26: 500 [IU]
  Filled 2015-11-26: qty 5

## 2015-11-26 NOTE — Progress Notes (Signed)
Diagnosis Association: HHT (hereditary hemorrhagic telangiectasia) ([name] (I78.0); Gastric AVM (Q27.33)  Provider: Dr. [name]  Procedure: Pt received 2 units of PRBCs via porta cath  Pt tolerated procedure well.  Post procedure: Pt alert, oriented and ambulatory at discharge. Porta cath flushed per protocol.

## 2015-11-26 NOTE — Discharge Instructions (Signed)
Blood Transfusion, Care After  These instructions give you information about caring for yourself after your procedure. Your doctor may also give you more specific instructions. Call your doctor if you have any problems or questions after your procedure.   HOME CARE   Take medicines only as told by your doctor. Ask your doctor if you can take an over-the-counter pain reliever if you have a fever or headache a day or two after your procedure.   Return to your normal activities as told by your doctor.  GET HELP IF:    You develop redness or irritation at your IV site.   You have a fever, chills, or a headache that does not go away.   Your pee (urine) is darker than normal.   Your urine turns:    Pink.    Red.    Brown.   The white part of your eye turns yellow (jaundice).   You feel weak after doing your normal activities.  GET HELP RIGHT AWAY IF:    You have trouble breathing.   You have fever and chills and you also have:    Anxiety.    Chest or back pain.    Flushed or pink skin.    Clammy or sweaty skin.    A fast heartbeat.    A sick feeling in your stomach (nausea).     This information is not intended to replace advice given to you by your health care provider. Make sure you discuss any questions you have with your health care provider.     Document Released: 02/16/2014 Document Reviewed: 02/16/2014  Elsevier Interactive Patient Education 2016 [employer]

## 2015-11-27 ENCOUNTER — Ambulatory Visit: Payer: Medicare Other | Admitting: Physical Therapy

## 2015-11-27 DIAGNOSIS — M6281 Muscle weakness (generalized): Secondary | ICD-10-CM

## 2015-11-27 DIAGNOSIS — G8929 Other chronic pain: Secondary | ICD-10-CM

## 2015-11-27 DIAGNOSIS — M25562 Pain in left knee: Secondary | ICD-10-CM

## 2015-11-27 DIAGNOSIS — M25511 Pain in right shoulder: Secondary | ICD-10-CM | POA: Diagnosis not present

## 2015-11-27 DIAGNOSIS — R2689 Other abnormalities of gait and mobility: Secondary | ICD-10-CM

## 2015-11-27 DIAGNOSIS — M25611 Stiffness of right shoulder, not elsewhere classified: Secondary | ICD-10-CM

## 2015-11-27 DIAGNOSIS — R262 Difficulty in walking, not elsewhere classified: Secondary | ICD-10-CM

## 2015-11-27 LAB — TYPE AND SCREEN
ABO/RH(D): A POS
ANTIBODY SCREEN: NEGATIVE
UNIT DIVISION: 0
Unit division: 0

## 2015-11-27 NOTE — Therapy (Signed)
[place], [place], [address] Phone: [phone]   Fax:  [phone]  Physical Therapy Treatment/Renewal  Patient Details  Name: [name] MRN: [id number] Date of Birth: [birth year] Referring Provider: [name], DO   Encounter Date: 11/27/2015      PT End of Session - 11/27/15 1833    Visit Number 9   Number of Visits 16   Date for PT Re-Evaluation 12/29/15   Authorization Type KX modifier after 15 visit, G -codes   PT Start Time 0940   PT Stop Time 1030   PT Time Calculation (min) 50 min   Activity Tolerance Patient tolerated treatment well   Behavior During Therapy [facility] for tasks assessed/performed      Past Medical History:  Diagnosis Date  . CHF (congestive heart failure) ([place])   . Chronic anemia   . Chronic diastolic CHF (congestive heart failure) ([place]) 10/03/2013  . Chronic GI bleeding    [name] 11/29/2014  . Family history of anesthesia complication    "niece has a hard time coming out" (09/15/2012)  . Frequent nosebleeds    chronic  . Gastric AV malformation    [name] 11/29/2014  . GERD (gastroesophageal reflux disease)   . Heart murmur   . History of blood transfusion "several"  . History of epistaxis   . HTN (hypertension), benign 03/02/2012  . Hyperlipidemia   . Iron deficiency anemia    chronic infusions"  . Lichen planus    Both lower extremities  . 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 Pneumonia 1990's X 2  . Seizures ([place]) 09/2014  . Telangiectasia    Gastric   . 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    Past Surgical History:  Procedure Laterality Date  . CATARACT EXTRACTION     "I think it was just one eye"  . ESOPHAGOGASTRODUODENOSCOPY  02/26/2011   Procedure: ESOPHAGOGASTRODUODENOSCOPY (EGD);  Surgeon: [name], MD;  Location[name];  Service: Endoscopy;  Laterality: N/A;  . ESOPHAGOGASTRODUODENOSCOPY N/A  11/08/2012   Procedure: ESOPHAGOGASTRODUODENOSCOPY (EGD);  Surgeon: [name], MD;  Location: [name];  Service: Endoscopy;  Laterality: N/A;  . ESOPHAGOGASTRODUODENOSCOPY N/A 10/04/2013   Procedure: ESOPHAGOGASTRODUODENOSCOPY (EGD);  Surgeon: Winfield Cunas., MD;  Location: [name];  Service: Endoscopy;  Laterality: N/A;  with APC on stand-by  . ESOPHAGOGASTRODUODENOSCOPY N/A 07/06/2014   Procedure: ESOPHAGOGASTRODUODENOSCOPY (EGD);  Surgeon: Clarene Essex, MD;  Location: [name];  Service: Endoscopy;  Laterality: N/A;  . ESOPHAGOGASTRODUODENOSCOPY N/A 09/05/2014   Procedure: ESOPHAGOGASTRODUODENOSCOPY (EGD);  Surgeon: Laurence Spates, MD;  Location: [name];  Service: Endoscopy;  Laterality: N/A;  APC on standby to control bleeding  . ESOPHAGOGASTRODUODENOSCOPY N/A 11/29/2014   Procedure: ESOPHAGOGASTRODUODENOSCOPY (EGD);  Surgeon: [name], MD;  Location: [facility] ENDOSCOPY;  Service: Endoscopy;  Laterality: N/A;  . ESOPHAGOGASTRODUODENOSCOPY N/A 09/28/2015   Procedure: ESOPHAGOGASTRODUODENOSCOPY (EGD);  Surgeon: Clarene Essex, MD;  Location: [facility] ENDOSCOPY;  Service: Endoscopy;  Laterality: N/A;  . ESOPHAGOGASTRODUODENOSCOPY ENDOSCOPY  08/19/2006   with laser treatment  . HOT HEMOSTASIS N/A 07/06/2014   Procedure: HOT HEMOSTASIS (ARGON PLASMA COAGULATION/BICAP);  Surgeon: Clarene Essex, MD;  Location: [name];  Service: Endoscopy;  Laterality: N/A;  . HOT HEMOSTASIS N/A 09/28/2015   Procedure: HOT HEMOSTASIS (ARGON PLASMA COAGULATION/BICAP);  Surgeon: Clarene Essex, MD;  Location: [facility] ENDOSCOPY;  Service: Endoscopy;  Laterality: N/A;  . NASAL HEMORRHAGE CONTROL     "for bleeding"   .  SAVORY DILATION  02/26/2011   Procedure: SAVORY DILATION;  Surgeon: [name], MD;  Location: [name];  Service: Endoscopy;  Laterality: N/A;  c-arm needed    There were no vitals filed for this visit.      Subjective Assessment - 11/27/15 0948    Subjective Shoulder Rt. about a 4/10  today.  Pt had transfusion yesterday.  She complains of fatigue in general, LE muscle fatigue, weakness.  She has min pain in lt. knee at times, balance is an issue.  She uses her cane in the home.  She is unable to drive, go shopping and even do her basic housework.     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 unlimited   How long can you stand comfortably? 30 min fatigue   How long can you walk comfortably? 10 min    Patient Stated Goals Stop hurting, be able to cook and clean for my husband, has to have someone come in for housework    Currently in Pain? Yes   Pain Score 4    Pain Location Shoulder            [facility] PT Assessment - 11/27/15 0952      Assessment   Medical Diagnosis Rt. shoulder  LE strength    Referring Provider [name], DO    Onset Date/Surgical Date --  chronic    Prior Therapy yes       Precautions   Precautions None     Restrictions   Weight Bearing Restrictions No     Balance Screen   Has the patient fallen in the past 6 months No     [place] residence   Living Arrangements Spouse/significant other   Type of [place] Access Level entry   [place] - single point     Prior Function   Level of Independence Needs assistance with ADLs;Needs assistance with homemaking   Vocation Retired   Leisure cooking     Cognition   Overall Cognitive Status Within Functional Limits for tasks assessed     AROM   Overall AROM  --  127 flexion,  Abduction 108 AROM right   Right Knee Extension 0   Right Knee Flexion 130   Left Knee Extension 0   Left Knee Flexion 118     Strength   Right Hip Flexion 4/5   Right Hip ABduction 3-/5   Left Hip Flexion 4/5   Left Hip ABduction 3-/5   Right Knee  Flexion 3+/5   Right Knee Extension 4+/5   Left Knee Flexion 3+/5   Left Knee Extension 4+/5   Right Ankle Dorsiflexion 4/5   Left Ankle Dorsiflexion 4/5     Balance   Balance Assessed --  see treatment     Berg Balance Test   Sit to Stand Able to stand without using hands and stabilize independently   Standing Unsupported Able to stand safely 2 minutes   Sitting with Back Unsupported but Feet Supported on Floor or Stool Able to sit safely and securely 2 minutes   Stand to Sit Sits safely with minimal use of hands   Transfers Able to transfer safely, minor use of hands   Standing Unsupported with Eyes Closed Able to stand 10 seconds  safely   Standing Ubsupported with Feet Together Able to place feet together independently and stand 1 minute safely   From Standing, Reach Forward with Outstretched Arm Can reach confidently >25 cm (10")   From Standing Position, Pick up Object from Floor Able to pick up shoe safely and easily   From Standing Position, Turn to Look Behind Over each Shoulder Turn sideways only but maintains balance   Turn 360 Degrees Able to turn 360 degrees safely in 4 seconds or less   Standing Unsupported, Alternately Place Feet on Step/Stool Able to complete 4 steps without aid or supervision   Standing Unsupported, One Foot in Front Needs help to step but can hold 15 seconds   Standing on One Leg Tries to lift leg/unable to hold 3 seconds but remains standing independently   Total Score 46   Berg comment: 46/56                     [facility] Adult PT Treatment/Exercise - 11/27/15 0001      Self-Care   Self-Care Posture;Other Self-Care Comments   Posture standing    Other Self-Care Comments  balance score , cane hip strength     Lumbar Exercises: Aerobic   Stationary Bike NuStep L 3, 8 min for strengthening     Moist Heat Therapy   Number Minutes Moist Heat 15 Minutes   Moist Heat Location Shoulder                PT Education - 11/27/15  1832    Education provided Yes   Education Details Berg balance test, results, normal postural sway, POC   Person(s) Educated Patient   Methods Explanation   Comprehension Verbalized understanding;Need further instruction          PT Short Term Goals - 11/27/15 1843      PT SHORT TERM GOAL #1   Title Pt will be independent with her HEP in order to improve functional mobility.    Baseline needs cues, unknown for LE    Status Partially Met     PT SHORT TERM GOAL #2   Title Pt will be able to demonstrate bilateral arm swing with gait.    Status Achieved           PT Long Term Goals - 11/27/15 1015      PT LONG TERM GOAL #1   Title pt will improve her FOTO score from 38% limitation to </= 35% limitation   Status Unable to assess     PT LONG TERM GOAL #2   Title Patient will be able to stand for up to 30 min in her home in order to prepare meal, clean up with min pain in knee, legs.     Time 4   Period Weeks   Status New     PT LONG TERM GOAL #3   Title Pt will improve R shoulder strength to >/=4/5 in order to improve functional mobiltiy, reaching, and lifting.    Time 4   Period Weeks   Status Unable to assess     PT LONG TERM GOAL #4   Title Pt will improve her Berg balance score to 52/56 or more to decrease risk of falls.     Time 4   Period Weeks   Status New     PT LONG TERM GOAL #5   Title Pt will increase hamstring and glute strength to 4/5 or more for improved stability with gait.  Time 4   Period Weeks   Status New     PT LONG TERM GOAL #6   Title --               Plan - 2015/12/14 1834    Clinical Impression Statement Per patient request, we renewed her POC today to include her LE and general mobility.  She is pleased with the progress of her shoulder. She has LE weakness (knee flexion, hip ext and abd) and decreased endurance.  Balance with tandem and single leg (step ups) has potential to improve.  She will cont to benefit from PT to  maximize full functional mobility.     Rehab Potential Good   Clinical Impairments Affecting Rehab Potential hereditary hemorrhagic telangiectasia (HHT), Port-a-Cath in R pectoralis. DM   PT Frequency 2x / week   PT Duration 4 weeks   PT Treatment/Interventions ADLs/Self Care Home Management;Functional mobility training;Gait training;Therapeutic activities;Therapeutic exercise;Patient/family education;Passive range of motion;Dry needling;Energy conservation;Taping;Manual techniques;Cryotherapy;Moist Heat;Neuromuscular re-education;Balance training   PT Next Visit Plan FOTO for shoulder,  MMT UE ,    No ice or heat over port. Assess for scoliosis, Shoulder ROM, strengthening, ; give simple LE strength ex for HEP (seated hamstring and hip abd ) ; GIVE UPPER TRAP STRETCH    PT Home Exercise Plan supine scapular stabilization with bands. continue   Consulted and Agree with Plan of Care Patient      Patient will benefit from skilled therapeutic intervention in order to improve the following deficits and impairments:  Abnormal gait, Difficulty walking, Impaired UE functional use, Pain, Postural dysfunction, Decreased strength, Decreased mobility, Impaired flexibility, Improper body mechanics, Decreased activity tolerance, Decreased balance, Impaired perceived functional ability  Visit Diagnosis: Right shoulder pain, unspecified chronicity  Stiffness of right shoulder, not elsewhere classified  Muscle weakness (generalized)  Chronic pain of left knee  Difficulty in walking, not elsewhere classified  Other abnormalities of gait and mobility       G-Codes - December 14, 2015 1017    Functional Assessment Tool Used FOTO, clinical judgement   Functional Limitation Mobility: Walking and moving around   Mobility: Walking and Moving Around Current Status [phone]) At least 20 percent but less than 40 percent impaired, limited or restricted   Mobility: Walking and Moving Around Goal Status [phone]) At least  20 percent but less than 40 percent impaired, limited or restricted   Mobility: Walking and Moving Around Discharge Status [phone]) At least 20 percent but less than 40 percent impaired, limited or restricted      Problem List Patient Active Problem List   Diagnosis Date Noted  . Pain in joint of right shoulder 10/22/2015  . PAF (paroxysmal atrial fibrillation) ([place]) 09/29/2015  . Lichen planus 70/02/7492  . Healthcare maintenance 06/20/2015  . Seborrheic keratosis 06/20/2015  . Memory loss 01/30/2015  . Simple febrile convulsions ([place]) 10/21/2014  . Hyperammonemia ([place]) 10/03/2014  . Chronic kidney disease, stage III (moderate)   . Chronic diastolic CHF (congestive heart failure) ([place]) 10/03/2013  . DM (diabetes mellitus), type 2, uncontrolled, with renal complications ([place]) 49/67/5916  . HTN (hypertension), benign 03/02/2012  . 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 [place], [place], [address] Phone: [phone]   Fax:  [phone]  Name: [name] MRN: [id number] Date of Birth: 1940/05/28   Raeford Razor, PT 12-14-15 6:52 PM Phone: [phone] Fax: [phone]

## 2015-12-02 ENCOUNTER — Telehealth: Payer: Self-pay | Admitting: Internal Medicine

## 2015-12-02 NOTE — Telephone Encounter (Signed)
APT. REMINDER CALL, NO ANSWER, NO VOICEMAIL °

## 2015-12-03 ENCOUNTER — Other Ambulatory Visit (INDEPENDENT_AMBULATORY_CARE_PROVIDER_SITE_OTHER): Payer: Medicare Other

## 2015-12-03 DIAGNOSIS — D509 Iron deficiency anemia, unspecified: Secondary | ICD-10-CM

## 2015-12-03 DIAGNOSIS — I780 Hereditary hemorrhagic telangiectasia: Secondary | ICD-10-CM | POA: Diagnosis not present

## 2015-12-03 DIAGNOSIS — D649 Anemia, unspecified: Secondary | ICD-10-CM

## 2015-12-03 DIAGNOSIS — K31819 Angiodysplasia of stomach and duodenum without bleeding: Secondary | ICD-10-CM

## 2015-12-03 DIAGNOSIS — D62 Acute posthemorrhagic anemia: Secondary | ICD-10-CM

## 2015-12-03 LAB — CBC WITH DIFFERENTIAL/PLATELET
Basophils Absolute: 0 10*3/uL (ref 0.0–0.1)
Basophils Relative: 1 %
EOS PCT: 4 %
Eosinophils Absolute: 0.2 10*3/uL (ref 0.0–0.7)
HCT: 32.4 % — ABNORMAL LOW (ref 36.0–46.0)
HEMOGLOBIN: 10.4 g/dL — AB (ref 12.0–15.0)
LYMPHS ABS: 0.6 10*3/uL — AB (ref 0.7–4.0)
LYMPHS PCT: 12 %
MCH: 27.4 pg (ref 26.0–34.0)
MCHC: 32.1 g/dL (ref 30.0–36.0)
MCV: 85.3 fL (ref 78.0–100.0)
Monocytes Absolute: 0.3 10*3/uL (ref 0.1–1.0)
Monocytes Relative: 7 %
Neutro Abs: 3.7 10*3/uL (ref 1.7–7.7)
Neutrophils Relative %: 77 %
PLATELETS: 134 10*3/uL — AB (ref 150–400)
RBC: 3.8 MIL/uL — AB (ref 3.87–5.11)
RDW: 15.7 % — ABNORMAL HIGH (ref 11.5–15.5)
WBC: 4.8 10*3/uL (ref 4.0–10.5)

## 2015-12-03 LAB — SAMPLE TO BLOOD BANK

## 2015-12-05 ENCOUNTER — Other Ambulatory Visit: Payer: Self-pay | Admitting: Oncology

## 2015-12-05 ENCOUNTER — Other Ambulatory Visit: Payer: Medicare Other

## 2015-12-05 ENCOUNTER — Telehealth: Payer: Self-pay | Admitting: *Deleted

## 2015-12-05 DIAGNOSIS — I780 Hereditary hemorrhagic telangiectasia: Secondary | ICD-10-CM

## 2015-12-05 DIAGNOSIS — D500 Iron deficiency anemia secondary to blood loss (chronic): Secondary | ICD-10-CM

## 2015-12-05 DIAGNOSIS — K31811 Angiodysplasia of stomach and duodenum with bleeding: Secondary | ICD-10-CM

## 2015-12-05 NOTE — Telephone Encounter (Signed)
-----   Message from [name], MD sent at 12/03/2015  3:45 PM EDT ----- Call pt: blood count OK today.  See when they can take her for IV iron at [facility] short stay - weekly x 2

## 2015-12-05 NOTE — Telephone Encounter (Signed)
Called pt - talked to her husband - informed "blood count OK" and need IV iron weekly x 2 per Dr [name]. Also called Shrt stay - talked to [facility]. Appt scheduled for Tuesday 10/31 at 1100AM here at [facility].

## 2015-12-09 ENCOUNTER — Other Ambulatory Visit (HOSPITAL_COMMUNITY): Payer: Self-pay | Admitting: *Deleted

## 2015-12-10 ENCOUNTER — Ambulatory Visit: Payer: Medicare Other | Admitting: Physical Therapy

## 2015-12-10 ENCOUNTER — Ambulatory Visit (HOSPITAL_COMMUNITY)
Admission: RE | Admit: 2015-12-10 | Discharge: 2015-12-10 | Disposition: A | Discharge status: Home / Self Care | Payer: Medicare Other | Source: Ambulatory Visit | Attending: Oncology | Admitting: Oncology

## 2015-12-10 DIAGNOSIS — R2689 Other abnormalities of gait and mobility: Secondary | ICD-10-CM

## 2015-12-10 DIAGNOSIS — K31811 Angiodysplasia of stomach and duodenum with bleeding: Secondary | ICD-10-CM | POA: Diagnosis not present

## 2015-12-10 DIAGNOSIS — D500 Iron deficiency anemia secondary to blood loss (chronic): Secondary | ICD-10-CM | POA: Insufficient documentation

## 2015-12-10 DIAGNOSIS — M25562 Pain in left knee: Secondary | ICD-10-CM

## 2015-12-10 DIAGNOSIS — M25511 Pain in right shoulder: Secondary | ICD-10-CM | POA: Diagnosis not present

## 2015-12-10 DIAGNOSIS — I780 Hereditary hemorrhagic telangiectasia: Secondary | ICD-10-CM | POA: Diagnosis present

## 2015-12-10 DIAGNOSIS — M6281 Muscle weakness (generalized): Secondary | ICD-10-CM

## 2015-12-10 DIAGNOSIS — M25611 Stiffness of right shoulder, not elsewhere classified: Secondary | ICD-10-CM

## 2015-12-10 DIAGNOSIS — R262 Difficulty in walking, not elsewhere classified: Secondary | ICD-10-CM

## 2015-12-10 DIAGNOSIS — G8929 Other chronic pain: Secondary | ICD-10-CM

## 2015-12-10 MED ORDER — SODIUM CHLORIDE 0.9 % IV SOLN
510.0000 mg | INTRAVENOUS | Status: DC
  Administered 2015-12-10: 510 mg via INTRAVENOUS
  Filled 2015-12-10: qty 17

## 2015-12-10 NOTE — Patient Instructions (Signed)
HIP: Hamstrings - Short Sitting    Rest leg on raised surface. Keep knee straight. Lift chest. Hold _30__ seconds. __2-3_ reps per set, __1-2_ sets per day, _5-7__ days per week  Copyright  VHI. All rights reserved.

## 2015-12-10 NOTE — Therapy (Signed)
[place], [place], [address] Phone: [phone]   Fax:  [phone]  Physical Therapy Treatment  Patient Details  Name: [name] MRN: [id number] Date of Birth: August 10, 1940 Referring Provider: [name], DO   Encounter Date: 12/10/2015      PT End of Session - 12/10/15 1344    Visit Number 10   Number of Visits 16   Date for PT Re-Evaluation 12/29/15   Authorization Type KX modifier after 15 visit, G -codes   PT Start Time 1330   PT Stop Time 1430   PT Time Calculation (min) 60 min   Activity Tolerance Patient tolerated treatment well   Behavior During Therapy [facility] for tasks assessed/performed      Past Medical History:  Diagnosis Date  . CHF (congestive heart failure) ([place])   . Chronic anemia   . Chronic diastolic CHF (congestive heart failure) ([place]) 10/03/2013  . Chronic GI bleeding    [name] 11/29/2014  . Family history of anesthesia complication    "niece has a hard time coming out" (09/15/2012)  . Frequent nosebleeds    chronic  . Gastric AV malformation    [name] 11/29/2014  . GERD (gastroesophageal reflux disease)   . Heart murmur   . History of blood transfusion "several"  . History of epistaxis   . HTN (hypertension), benign 03/02/2012  . Hyperlipidemia   . Iron deficiency anemia    chronic infusions"  . Lichen planus    Both lower extremities  . 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 Pneumonia 1990's X 2  . Seizures ([place]) 09/2014  . Telangiectasia    Gastric   . 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    Past Surgical History:  Procedure Laterality Date  . CATARACT EXTRACTION     "I think it was just one eye"  . ESOPHAGOGASTRODUODENOSCOPY  02/26/2011   Procedure: ESOPHAGOGASTRODUODENOSCOPY (EGD);  Surgeon: [name], MD;  Location: [name];  Service: Endoscopy;  Laterality: N/A;  . ESOPHAGOGASTRODUODENOSCOPY N/A 11/08/2012    Procedure: ESOPHAGOGASTRODUODENOSCOPY (EGD);  Surgeon: [name], MD;  Location: [name];  Service: Endoscopy;  Laterality: N/A;  . ESOPHAGOGASTRODUODENOSCOPY N/A 10/04/2013   Procedure: ESOPHAGOGASTRODUODENOSCOPY (EGD);  Surgeon: Winfield Cunas., MD;  Location: [name];  Service: Endoscopy;  Laterality: N/A;  with APC on stand-by  . ESOPHAGOGASTRODUODENOSCOPY N/A 07/06/2014   Procedure: ESOPHAGOGASTRODUODENOSCOPY (EGD);  Surgeon: Clarene Essex, MD;  Location: [name];  Service: Endoscopy;  Laterality: N/A;  . ESOPHAGOGASTRODUODENOSCOPY N/A 09/05/2014   Procedure: ESOPHAGOGASTRODUODENOSCOPY (EGD);  Surgeon: Laurence Spates, MD;  Location: [name];  Service: Endoscopy;  Laterality: N/A;  APC on standby to control bleeding  . ESOPHAGOGASTRODUODENOSCOPY N/A 11/29/2014   Procedure: ESOPHAGOGASTRODUODENOSCOPY (EGD);  Surgeon: [name], MD;  Location: [facility] ENDOSCOPY;  Service: Endoscopy;  Laterality: N/A;  . ESOPHAGOGASTRODUODENOSCOPY N/A 09/28/2015   Procedure: ESOPHAGOGASTRODUODENOSCOPY (EGD);  Surgeon: Clarene Essex, MD;  Location: [facility] ENDOSCOPY;  Service: Endoscopy;  Laterality: N/A;  . ESOPHAGOGASTRODUODENOSCOPY ENDOSCOPY  08/19/2006   with laser treatment  . HOT HEMOSTASIS N/A 07/06/2014   Procedure: HOT HEMOSTASIS (ARGON PLASMA COAGULATION/BICAP);  Surgeon: Clarene Essex, MD;  Location[name];  Service: Endoscopy;  Laterality: N/A;  . HOT HEMOSTASIS N/A 09/28/2015   Procedure: HOT HEMOSTASIS (ARGON PLASMA COAGULATION/BICAP);  Surgeon: Clarene Essex, MD;  Location: [facility] ENDOSCOPY;  Service: Endoscopy;  Laterality: N/A;  . NASAL HEMORRHAGE CONTROL     "for bleeding"   .  SAVORY DILATION  02/26/2011   Procedure: SAVORY DILATION;  Surgeon: [name], MD;  Location: [name];  Service: Endoscopy;  Laterality: N/A;  c-arm needed    There were no vitals filed for this visit.      Subjective Assessment - 12/10/15 1334    Subjective Doing OK.  Shoulder hurts just a [place]  bit.  Legs are about 5/10 with standing and walking.    Currently in Pain? Yes   Pain Score 5    Pain Location Leg   Pain Orientation Right;Left   Pain Descriptors / Indicators Aching   Pain Type Chronic pain   Pain Onset More than a month ago   Pain Frequency Intermittent   Aggravating Factors  stand, walking   Pain Relieving Factors rest, sitting    Pain Score 2   Pain Location Shoulder   Pain Orientation Right   Pain Type Chronic pain   Pain Frequency Intermittent                         [facility] Adult PT Treatment/Exercise - 12/10/15 1338      Lumbar Exercises: Stretches   Active Hamstring Stretch 3 reps;30 seconds   Lower Trunk Rotation 5 reps;10 seconds     Lumbar Exercises: Sidelying   Hip Abduction 10 reps     Knee/Hip Exercises: Seated   Ball Squeeze x 10    Marching Strengthening;Both;1 set;20 reps     Knee/Hip Exercises: Supine   Bridges Strengthening;Both;1 set;15 reps   Straight Leg Raises Strengthening;Both;1 set;10 reps     Shoulder Exercises: Supine   Horizontal ABduction Strengthening;Both;10 reps;Theraband  2 sets   Theraband Level (Shoulder Horizontal ABduction) Level 2 (Red)   External Rotation 10 reps   Theraband Level (Shoulder External Rotation) Level 2 (Red)   Flexion AAROM;Both;10 reps     Shoulder Exercises: Stretch   Other Shoulder Stretches upper trap stretch bilateral x 30 sec x 2      Moist Heat Therapy   Number Minutes Moist Heat 15 Minutes   Moist Heat Location --  bilateral thighs in supine                 PT Education - 12/10/15 2117    Education provided Yes   Education Details LE HEP and stretch hip    Person(s) Educated Patient   Methods Explanation;Demonstration;Tactile cues;Verbal cues   Comprehension Need further instruction;Returned demonstration;Verbalized understanding;Verbal cues required          PT Short Term Goals - 11/27/15 1843      PT SHORT TERM GOAL #1   Title Pt will be  independent with her HEP in order to improve functional mobility.    Baseline needs cues, unknown for LE    Status Partially Met     PT SHORT TERM GOAL #2   Title Pt will be able to demonstrate bilateral arm swing with gait.    Status Achieved           PT Long Term Goals - 11/27/15 1015      PT LONG TERM GOAL #1   Title pt will improve her FOTO score from 38% limitation to </= 35% limitation   Status Unable to assess     PT LONG TERM GOAL #2   Title Patient will be able to stand for up to 30 min in her home in order to prepare meal, clean up with min pain in knee, legs.  Time 4   Period Weeks   Status New     PT LONG TERM GOAL #3   Title Pt will improve R shoulder strength to >/=4/5 in order to improve functional mobiltiy, reaching, and lifting.    Time 4   Period Weeks   Status Unable to assess     PT LONG TERM GOAL #4   Title Pt will improve her Berg balance score to 52/56 or more to decrease risk of falls.     Time 4   Period Weeks   Status New     PT LONG TERM GOAL #5   Title Pt will increase hamstring and glute strength to 4/5 or more for improved stability with gait.    Time 4   Period Weeks   Status New     PT LONG TERM GOAL #6   Title --               Plan - 2015-12-21 1536    Clinical Impression Statement Patient did well with LE strength and flexibility ex today.  She has muscle tightness in  bilateral rectus femoris and lateral hips.  Sits alot and understands how this may contribute to her ant hip tightness. No further goals met.     PT Next Visit Plan MMT UE ,    No ice or heat over port. Assess for scoliosis, Shoulder ROM, strengthening, ; give simple LE strength ex for HEP (seated hamstring and hip abd ) ; GIVE UPPER TRAP STRETCH    PT Home Exercise Plan supine scapular stabilization with bands. continue, forgot to give HEP due to time/FOTO not working.     Consulted and Agree with Plan of Care Patient      Patient will benefit from  skilled therapeutic intervention in order to improve the following deficits and impairments:  Abnormal gait, Difficulty walking, Impaired UE functional use, Pain, Postural dysfunction, Decreased strength, Decreased mobility, Impaired flexibility, Improper body mechanics, Decreased activity tolerance, Decreased balance, Impaired perceived functional ability  Visit Diagnosis: Right shoulder pain, unspecified chronicity  Stiffness of right shoulder, not elsewhere classified  Muscle weakness (generalized)  Chronic pain of left knee  Difficulty in walking, not elsewhere classified  Other abnormalities of gait and mobility       G-Codes - 21-Dec-2015 1413    Functional Assessment Tool Used FOTO, clinical judgement   Carrying, Moving and Handling Objects Current Status (R4431) At least 20 percent but less than 40 percent impaired, limited or restricted   Carrying, Moving and Handling Objects Goal Status (V4008) At least 20 percent but less than 40 percent impaired, limited or restricted      Problem List Patient Active Problem List   Diagnosis Date Noted  . Pain in joint of right shoulder 10/22/2015  . PAF (paroxysmal atrial fibrillation) ([place]) 09/29/2015  . Lichen planus 67/61/9509  . Healthcare maintenance 06/20/2015  . Seborrheic keratosis 06/20/2015  . Memory loss 01/30/2015  . Simple febrile convulsions ([place]) 10/21/2014  . Hyperammonemia ([place]) 10/03/2014  . Chronic kidney disease, stage III (moderate)   . Chronic diastolic CHF (congestive heart failure) ([place]) 10/03/2013  . DM (diabetes mellitus), type 2, uncontrolled, with renal complications ([place]) 32/67/1245  . HTN (hypertension), benign 03/02/2012  . 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 [place], [place], [address] Phone: [phone]   Fax:   [phone]  Name: Vani  HELEN WINTERHALTER MRN: [id number] Date of Birth: Jul 06, 1940   Raeford Razor, PT 12/10/15 9:19 PM Phone: [phone] Fax: [phone]

## 2015-12-12 ENCOUNTER — Ambulatory Visit: Payer: Medicare Other | Attending: Internal Medicine | Admitting: Physical Therapy

## 2015-12-12 DIAGNOSIS — M6281 Muscle weakness (generalized): Secondary | ICD-10-CM | POA: Insufficient documentation

## 2015-12-12 DIAGNOSIS — R2689 Other abnormalities of gait and mobility: Secondary | ICD-10-CM | POA: Insufficient documentation

## 2015-12-12 DIAGNOSIS — M25511 Pain in right shoulder: Secondary | ICD-10-CM | POA: Insufficient documentation

## 2015-12-12 DIAGNOSIS — M25611 Stiffness of right shoulder, not elsewhere classified: Secondary | ICD-10-CM | POA: Insufficient documentation

## 2015-12-12 DIAGNOSIS — R262 Difficulty in walking, not elsewhere classified: Secondary | ICD-10-CM | POA: Insufficient documentation

## 2015-12-12 DIAGNOSIS — M25562 Pain in left knee: Secondary | ICD-10-CM | POA: Insufficient documentation

## 2015-12-12 DIAGNOSIS — G8929 Other chronic pain: Secondary | ICD-10-CM | POA: Insufficient documentation

## 2015-12-12 NOTE — Progress Notes (Signed)
Personally have participated in and made any corrections needed to history, physical, neuro exam,assessment and plan as stated above.    Antonia Ahern, MD [facility] 

## 2015-12-16 ENCOUNTER — Telehealth: Payer: Self-pay | Admitting: Internal Medicine

## 2015-12-16 NOTE — Telephone Encounter (Signed)
APT. REMINDER CALL, NO ANSWER, NO VOICEMAIL °

## 2015-12-17 ENCOUNTER — Other Ambulatory Visit (INDEPENDENT_AMBULATORY_CARE_PROVIDER_SITE_OTHER): Payer: Medicare Other

## 2015-12-17 ENCOUNTER — Ambulatory Visit: Payer: Medicare Other | Admitting: Physical Therapy

## 2015-12-17 ENCOUNTER — Telehealth: Payer: Self-pay | Admitting: *Deleted

## 2015-12-17 DIAGNOSIS — M25511 Pain in right shoulder: Secondary | ICD-10-CM | POA: Diagnosis not present

## 2015-12-17 DIAGNOSIS — Q2733 Arteriovenous malformation of digestive system vessel: Secondary | ICD-10-CM

## 2015-12-17 DIAGNOSIS — D649 Anemia, unspecified: Secondary | ICD-10-CM

## 2015-12-17 DIAGNOSIS — D62 Acute posthemorrhagic anemia: Secondary | ICD-10-CM

## 2015-12-17 DIAGNOSIS — G8929 Other chronic pain: Secondary | ICD-10-CM

## 2015-12-17 DIAGNOSIS — M25611 Stiffness of right shoulder, not elsewhere classified: Secondary | ICD-10-CM | POA: Diagnosis present

## 2015-12-17 DIAGNOSIS — R2689 Other abnormalities of gait and mobility: Secondary | ICD-10-CM

## 2015-12-17 DIAGNOSIS — M6281 Muscle weakness (generalized): Secondary | ICD-10-CM | POA: Diagnosis present

## 2015-12-17 DIAGNOSIS — M25562 Pain in left knee: Secondary | ICD-10-CM

## 2015-12-17 DIAGNOSIS — I780 Hereditary hemorrhagic telangiectasia: Secondary | ICD-10-CM

## 2015-12-17 DIAGNOSIS — K31819 Angiodysplasia of stomach and duodenum without bleeding: Secondary | ICD-10-CM

## 2015-12-17 DIAGNOSIS — R262 Difficulty in walking, not elsewhere classified: Secondary | ICD-10-CM

## 2015-12-17 LAB — CBC WITH DIFFERENTIAL/PLATELET
Basophils Absolute: 0 10*3/uL (ref 0.0–0.1)
Basophils Relative: 1 %
EOS PCT: 7 %
Eosinophils Absolute: 0.4 10*3/uL (ref 0.0–0.7)
HCT: 28.2 % — ABNORMAL LOW (ref 36.0–46.0)
HEMOGLOBIN: 9.1 g/dL — AB (ref 12.0–15.0)
LYMPHS ABS: 0.6 10*3/uL — AB (ref 0.7–4.0)
LYMPHS PCT: 11 %
MCH: 28.7 pg (ref 26.0–34.0)
MCHC: 32.3 g/dL (ref 30.0–36.0)
MCV: 89 fL (ref 78.0–100.0)
MONOS PCT: 9 %
Monocytes Absolute: 0.5 10*3/uL (ref 0.1–1.0)
NEUTROS PCT: 72 %
Neutro Abs: 3.7 10*3/uL (ref 1.7–7.7)
Platelets: 227 10*3/uL (ref 150–400)
RBC: 3.17 MIL/uL — AB (ref 3.87–5.11)
RDW: 16.3 % — ABNORMAL HIGH (ref 11.5–15.5)
WBC: 5.2 10*3/uL (ref 4.0–10.5)

## 2015-12-17 LAB — SAMPLE TO BLOOD BANK

## 2015-12-17 NOTE — Telephone Encounter (Signed)
Pt called - talked to her husband,informed "blood count OK - no transfusion this week " per Dr [name]. Stated he will let her know and thanks for calling.

## 2015-12-17 NOTE — Telephone Encounter (Signed)
-----   Message from [name], MD sent at 12/17/2015  2:34 PM EST ----- Call pt: blood count OK - no transfusion this week

## 2015-12-17 NOTE — Patient Instructions (Signed)
HIP: Hamstrings - Short Sitting    Rest leg on raised surface. Keep knee straight. Lift chest. Hold __30_ seconds. _3__ reps per set, __1_ sets per day, __5_ days per week  Copyright  VHI. All rights reserved.

## 2015-12-17 NOTE — Therapy (Signed)
[place], [place], [address] Phone: [phone]   Fax:  [phone]  Physical Therapy Treatment  Patient Details  Name: [name] MRN: [id number] Date of Birth: 08/04/1940 Referring Provider: [name], DO   Encounter Date: 12/17/2015      PT End of Session - 12/17/15 1439    Visit Number 11   Number of Visits 16   Date for PT Re-Evaluation 12/29/15   Authorization Type KX modifier after 15 visit, G -codes   PT Start Time 2751  pt late   PT Stop Time 1420   PT Time Calculation (min) 42 min   Activity Tolerance Patient tolerated treatment well   Behavior During Therapy [facility] for tasks assessed/performed      Past Medical History:  Diagnosis Date  . CHF (congestive heart failure) ([place])   . Chronic anemia   . Chronic diastolic CHF (congestive heart failure) ([place]) 10/03/2013  . Chronic GI bleeding    [name] 11/29/2014  . Family history of anesthesia complication    "niece has a hard time coming out" (09/15/2012)  . Frequent nosebleeds    chronic  . Gastric AV malformation    [name] 11/29/2014  . GERD (gastroesophageal reflux disease)   . Heart murmur   . History of blood transfusion "several"  . History of epistaxis   . HTN (hypertension), benign 03/02/2012  . Hyperlipidemia   . Iron deficiency anemia    chronic infusions"  . Lichen planus    Both lower extremities  . 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 Pneumonia 1990's X 2  . Seizures ([place]) 09/2014  . Telangiectasia    Gastric   . 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    Past Surgical History:  Procedure Laterality Date  . CATARACT EXTRACTION     "I think it was just one eye"  . ESOPHAGOGASTRODUODENOSCOPY  02/26/2011   Procedure: ESOPHAGOGASTRODUODENOSCOPY (EGD);  Surgeon: [name], MD;  Location: [name];  Service: Endoscopy;  Laterality: N/A;  . ESOPHAGOGASTRODUODENOSCOPY N/A  11/08/2012   Procedure: ESOPHAGOGASTRODUODENOSCOPY (EGD);  Surgeon: [name], MD;  Location: [name];  Service: Endoscopy;  Laterality: N/A;  . ESOPHAGOGASTRODUODENOSCOPY N/A 10/04/2013   Procedure: ESOPHAGOGASTRODUODENOSCOPY (EGD);  Surgeon: Winfield Cunas., MD;  Location: [name];  Service: Endoscopy;  Laterality: N/A;  with APC on stand-by  . ESOPHAGOGASTRODUODENOSCOPY N/A 07/06/2014   Procedure: ESOPHAGOGASTRODUODENOSCOPY (EGD);  Surgeon: Clarene Essex, MD;  Location: [name];  Service: Endoscopy;  Laterality: N/A;  . ESOPHAGOGASTRODUODENOSCOPY N/A 09/05/2014   Procedure: ESOPHAGOGASTRODUODENOSCOPY (EGD);  Surgeon: Laurence Spates, MD;  Location: [name];  Service: Endoscopy;  Laterality: N/A;  APC on standby to control bleeding  . ESOPHAGOGASTRODUODENOSCOPY N/A 11/29/2014   Procedure: ESOPHAGOGASTRODUODENOSCOPY (EGD);  Surgeon: [name], MD;  Location: [facility] ENDOSCOPY;  Service: Endoscopy;  Laterality: N/A;  . ESOPHAGOGASTRODUODENOSCOPY N/A 09/28/2015   Procedure: ESOPHAGOGASTRODUODENOSCOPY (EGD);  Surgeon: Clarene Essex, MD;  Location: [facility] ENDOSCOPY;  Service: Endoscopy;  Laterality: N/A;  . ESOPHAGOGASTRODUODENOSCOPY ENDOSCOPY  08/19/2006   with laser treatment  . HOT HEMOSTASIS N/A 07/06/2014   Procedure: HOT HEMOSTASIS (ARGON PLASMA COAGULATION/BICAP);  Surgeon: Clarene Essex, MD;  Location: [name];  Service: Endoscopy;  Laterality: N/A;  . HOT HEMOSTASIS N/A 09/28/2015   Procedure: HOT HEMOSTASIS (ARGON PLASMA COAGULATION/BICAP);  Surgeon: Clarene Essex, MD;  Location[facility] ENDOSCOPY;  Service: Endoscopy;  Laterality: N/A;  . NASAL HEMORRHAGE CONTROL     "  for bleeding"   . SAVORY DILATION  02/26/2011   Procedure: SAVORY DILATION;  Surgeon: [name], MD;  Location: [name];  Service: Endoscopy;  Laterality: N/A;  c-arm needed    There were no vitals filed for this visit.      Subjective Assessment - 12/17/15 1341    Subjective No real pain today,  Missed  a day last week, was sick.     Currently in Pain? No/denies           [facility] Adult PT Treatment/Exercise - 12/17/15 1346      High Level Balance   High Level Balance Activities Side stepping;Backward walking;Head turns;Tandem walking   High Level Balance Comments sidestepping in parallel bars, tandem walking   head turns up and down, rotation  needs min A to gait.     Lumbar Exercises: Aerobic   Stationary Bike 6 min UE and LE level 5      Knee/Hip Exercises: Stretches   Active Hamstring Stretch Both;2 reps;30 seconds     Knee/Hip Exercises: Standing   Heel Raises Both;1 set;20 reps   Hip Abduction Stengthening;Both;1 set;10 reps   Hip Extension Stengthening;Both;1 set;10 reps;Knee straight   Lateral Step Up Both;1 set;10 reps;Hand Hold: 2;Step Height: 4"   Forward Step Up Both;1 set;10 reps;Step Height: 4"   Functional Squat 1 set;10 reps   Other Standing Knee Exercises Ant hip stretch at counter 30 sec x 2 each                 PT Education - 12/17/15 1437    Education provided Yes   Education Details balance and standing ther ex   Person(s) Educated Patient   Methods Explanation   Comprehension Verbalized understanding;Returned demonstration;Need further instruction          PT Short Term Goals - 11/27/15 1843      PT SHORT TERM GOAL #1   Title Pt will be independent with her HEP in order to improve functional mobility.    Baseline needs cues, unknown for LE    Status Partially Met     PT SHORT TERM GOAL #2   Title Pt will be able to demonstrate bilateral arm swing with gait.    Status Achieved           PT Long Term Goals - 12/17/15 1442      PT LONG TERM GOAL #1   Title pt will improve her FOTO score from 38% limitation to </= 35% limitation   Status On-going     PT LONG TERM GOAL #2   Title Patient will be able to stand for up to 30 min in her home in order to prepare meal, clean up with min pain in knee, legs.     Status On-going     PT  LONG TERM GOAL #3   Title Pt will improve R shoulder strength to >/=4/5 in order to improve functional mobiltiy, reaching, and lifting.    Status Unable to assess     PT LONG TERM GOAL #4   Title Pt will improve her Berg balance score to 52/56 or more to decrease risk of falls.     Status Unable to assess     PT LONG TERM GOAL #5   Title Pt will increase hamstring and glute strength to 4/5 or more for improved stability with gait.    Status On-going               Plan -  12/17/15 1439    Clinical Impression Statement Patient required mod to max verbal cueing for functional exercises today.  Unable to walk normally while head turning.  Decreased coordination.  Pt attributes to her seizure she had in Aug.  No pain increase with exercises, only took 1 seated rest break today.    PT Next Visit Plan   No ice or heat over port. Assess for scoliosis, Shoulder ROM, strengthening, ; give simple LE strength ex for HEP (seated hamstring and given bridge, hip abd    PT Home Exercise Plan supine scapular stabilization with bands. continue,hamstring seated    Consulted and Agree with Plan of Care Patient      Patient will benefit from skilled therapeutic intervention in order to improve the following deficits and impairments:  Abnormal gait, Difficulty walking, Impaired UE functional use, Pain, Postural dysfunction, Decreased strength, Decreased mobility, Impaired flexibility, Improper body mechanics, Decreased activity tolerance, Decreased balance, Impaired perceived functional ability  Visit Diagnosis: Right shoulder pain, unspecified chronicity  Stiffness of right shoulder, not elsewhere classified  Muscle weakness (generalized)  Chronic pain of left knee  Difficulty in walking, not elsewhere classified  Other abnormalities of gait and mobility     Problem List Patient Active Problem List   Diagnosis Date Noted  . Pain in joint of right shoulder 10/22/2015  . PAF (paroxysmal  atrial fibrillation) ([place]) 09/29/2015  . Lichen planus 58/68/2574  . Healthcare maintenance 06/20/2015  . Seborrheic keratosis 06/20/2015  . Memory loss 01/30/2015  . Simple febrile convulsions ([place]) 10/21/2014  . Hyperammonemia ([place]) 10/03/2014  . Chronic kidney disease, stage III (moderate)   . Chronic diastolic CHF (congestive heart failure) ([place]) 10/03/2013  . DM (diabetes mellitus), type 2, uncontrolled, with renal complications ([place]) 93/55/2174  . HTN (hypertension), benign 03/02/2012  . 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 [place], [place], [address] Phone: [phone]   Fax:  [phone]  Name: BRYTTANY TORTORELLI MRN: [id number] Date of Birth: 1940/12/20   Raeford Razor, PT 12/17/15 2:44 PM Phone: [phone] Fax: [phone]

## 2015-12-19 ENCOUNTER — Ambulatory Visit: Payer: Medicare Other | Admitting: Physical Therapy

## 2015-12-19 ENCOUNTER — Other Ambulatory Visit: Payer: Medicare Other

## 2015-12-19 DIAGNOSIS — R2689 Other abnormalities of gait and mobility: Secondary | ICD-10-CM

## 2015-12-19 DIAGNOSIS — M25511 Pain in right shoulder: Secondary | ICD-10-CM | POA: Diagnosis not present

## 2015-12-19 DIAGNOSIS — G8929 Other chronic pain: Secondary | ICD-10-CM

## 2015-12-19 DIAGNOSIS — M6281 Muscle weakness (generalized): Secondary | ICD-10-CM

## 2015-12-19 DIAGNOSIS — M25562 Pain in left knee: Secondary | ICD-10-CM

## 2015-12-19 DIAGNOSIS — M25611 Stiffness of right shoulder, not elsewhere classified: Secondary | ICD-10-CM

## 2015-12-19 DIAGNOSIS — R262 Difficulty in walking, not elsewhere classified: Secondary | ICD-10-CM

## 2015-12-19 NOTE — Therapy (Signed)
[place], [place], [address] Phone: [phone]   Fax:  [phone]  Physical Therapy Treatment  Patient Details  Name: [name] MRN: [id number] Date of Birth: Jul 03, 1940 Referring Provider: [name], DO   Encounter Date: 12/19/2015      PT End of Session - 12/19/15 1052    Visit Number 12   Number of Visits 16   Date for PT Re-Evaluation 12/29/15   Authorization Type KX modifier after 15 visit, G -codes   PT Start Time 1022   PT Stop Time 1100   PT Time Calculation (min) 38 min   Activity Tolerance Patient tolerated treatment well   Behavior During Therapy [facility] for tasks assessed/performed      Past Medical History:  Diagnosis Date  . CHF (congestive heart failure) ([place])   . Chronic anemia   . Chronic diastolic CHF (congestive heart failure) ([place]) 10/03/2013  . Chronic GI bleeding    [name] 11/29/2014  . Family history of anesthesia complication    "niece has a hard time coming out" (09/15/2012)  . Frequent nosebleeds    chronic  . Gastric AV malformation    [name] 11/29/2014  . GERD (gastroesophageal reflux disease)   . Heart murmur   . History of blood transfusion "several"  . History of epistaxis   . HTN (hypertension), benign 03/02/2012  . Hyperlipidemia   . Iron deficiency anemia    chronic infusions"  . Lichen planus    Both lower extremities  . 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 Pneumonia 1990's X 2  . Seizures ([place]) 09/2014  . Telangiectasia    Gastric   . 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    Past Surgical History:  Procedure Laterality Date  . CATARACT EXTRACTION     "I think it was just one eye"  . ESOPHAGOGASTRODUODENOSCOPY  02/26/2011   Procedure: ESOPHAGOGASTRODUODENOSCOPY (EGD);  Surgeon: [name], MD;  Location: [name];  Service: Endoscopy;  Laterality: N/A;  . ESOPHAGOGASTRODUODENOSCOPY N/A 11/08/2012    Procedure: ESOPHAGOGASTRODUODENOSCOPY (EGD);  Surgeon: [name], MD;  Location: [name];  Service: Endoscopy;  Laterality: N/A;  . ESOPHAGOGASTRODUODENOSCOPY N/A 10/04/2013   Procedure: ESOPHAGOGASTRODUODENOSCOPY (EGD);  Surgeon: Winfield Cunas., MD;  Location: [name];  Service: Endoscopy;  Laterality: N/A;  with APC on stand-by  . ESOPHAGOGASTRODUODENOSCOPY N/A 07/06/2014   Procedure: ESOPHAGOGASTRODUODENOSCOPY (EGD);  Surgeon: Clarene Essex, MD;  Location: [name];  Service: Endoscopy;  Laterality: N/A;  . ESOPHAGOGASTRODUODENOSCOPY N/A 09/05/2014   Procedure: ESOPHAGOGASTRODUODENOSCOPY (EGD);  Surgeon: Laurence Spates, MD;  Location: [name];  Service: Endoscopy;  Laterality: N/A;  APC on standby to control bleeding  . ESOPHAGOGASTRODUODENOSCOPY N/A 11/29/2014   Procedure: ESOPHAGOGASTRODUODENOSCOPY (EGD);  Surgeon: [name], MD;  Location: [facility] ENDOSCOPY;  Service: Endoscopy;  Laterality: N/A;  . ESOPHAGOGASTRODUODENOSCOPY N/A 09/28/2015   Procedure: ESOPHAGOGASTRODUODENOSCOPY (EGD);  Surgeon: Clarene Essex, MD;  Location: [facility] ENDOSCOPY;  Service: Endoscopy;  Laterality: N/A;  . ESOPHAGOGASTRODUODENOSCOPY ENDOSCOPY  08/19/2006   with laser treatment  . HOT HEMOSTASIS N/A 07/06/2014   Procedure: HOT HEMOSTASIS (ARGON PLASMA COAGULATION/BICAP);  Surgeon: Clarene Essex, MD;  Location: [name];  Service: Endoscopy;  Laterality: N/A;  . HOT HEMOSTASIS N/A 09/28/2015   Procedure: HOT HEMOSTASIS (ARGON PLASMA COAGULATION/BICAP);  Surgeon: Clarene Essex, MD;  Location: [facility] ENDOSCOPY;  Service: Endoscopy;  Laterality: N/A;  . NASAL HEMORRHAGE CONTROL     "for bleeding"   .  SAVORY DILATION  02/26/2011   Procedure: SAVORY DILATION;  Surgeon: [name], MD;  Location: [name];  Service: Endoscopy;  Laterality: N/A;  c-arm needed    There were no vitals filed for this visit.      Subjective Assessment - 12/19/15 1050    Subjective No pain, I want to strengthen my legs.      Currently in Pain? No/denies                PT Education - 12/19/15 1515    Education provided Yes   Education Details post DC, plans for continued care Optometrist. and [facility])    Person(s) Educated Patient   Methods Explanation;Handout   Comprehension Verbalized understanding;Returned demonstration          PT Short Term Goals - 11/27/15 1843      PT SHORT TERM GOAL #1   Title Pt will be independent with her HEP in order to improve functional mobility.    Baseline needs cues, unknown for LE    Status Partially Met     PT SHORT TERM GOAL #2   Title Pt will be able to demonstrate bilateral arm swing with gait.    Status Achieved           PT Long Term Goals - 12/17/15 1442      PT LONG TERM GOAL #1   Title pt will improve her FOTO score from 38% limitation to </= 35% limitation   Status On-going     PT LONG TERM GOAL #2   Title Patient will be able to stand for up to 30 min in her home in order to prepare meal, clean up with min pain in knee, legs.     Status On-going     PT LONG TERM GOAL #3   Title Pt will improve R shoulder strength to >/=4/5 in order to improve functional mobiltiy, reaching, and lifting.    Status Unable to assess     PT LONG TERM GOAL #4   Title Pt will improve her Berg balance score to 52/56 or more to decrease risk of falls.     Status Unable to assess     PT LONG TERM GOAL #5   Title Pt will increase hamstring and glute strength to 4/5 or more for improved stability with gait.    Status On-going               Plan - 12/19/15 1101    Clinical Impression Statement Patient without pain today, just fatigues with standing and gait.  Discussed options for continued exercise.  She used to go to [facility] center, would also be a good candidate for our Post PT group.     PT Next Visit Plan 16th will be her last day . Functional strength, gait and balance . Finalized HEP .  Finish POC, FOTO   PT Home Exercise Plan  supine scapular stabilization with bands. continue,hamstring seated    Consulted and Agree with Plan of Care Patient     [place] SOON Patient will benefit from skilled therapeutic intervention in order to improve the following deficits and impairments:  Abnormal gait, Difficulty walking, Impaired UE functional use, Pain, Postural dysfunction, Decreased strength, Decreased mobility, Impaired flexibility, Improper body mechanics, Decreased activity tolerance, Decreased balance, Impaired perceived functional ability  Visit Diagnosis: Right shoulder pain, unspecified chronicity  Stiffness of right shoulder, not elsewhere classified  Muscle weakness (generalized)  Chronic pain of left knee  Difficulty in walking, not elsewhere classified  Other abnormalities of gait and mobility     Problem List Patient Active Problem List   Diagnosis Date Noted  . Pain in joint of right shoulder 10/22/2015  . PAF (paroxysmal atrial fibrillation) ([place]) 09/29/2015  . Lichen planus 56/23/9215  . Healthcare maintenance 06/20/2015  . Seborrheic keratosis 06/20/2015  . Memory loss 01/30/2015  . Simple febrile convulsions ([place]) 10/21/2014  . Hyperammonemia ([place]) 10/03/2014  . Chronic kidney disease, stage III (moderate)   . Chronic diastolic CHF (congestive heart failure) ([place]) 10/03/2013  . DM (diabetes mellitus), type 2, uncontrolled, with renal complications ([place]) 15/82/6587  . HTN (hypertension), benign 03/02/2012  . 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 [place], [place], [address] Phone: [phone]   Fax:  [phone]  Name: [name] MRN: [id number] Date of Birth: July 14, 1940  Raeford Razor, PT 12/19/15 3:24 PM Phone: [phone] Fax: [phone]

## 2015-12-24 ENCOUNTER — Ambulatory Visit: Payer: Medicare Other | Admitting: Physical Therapy

## 2015-12-24 DIAGNOSIS — M25511 Pain in right shoulder: Secondary | ICD-10-CM | POA: Diagnosis not present

## 2015-12-24 DIAGNOSIS — R2689 Other abnormalities of gait and mobility: Secondary | ICD-10-CM

## 2015-12-24 DIAGNOSIS — M25611 Stiffness of right shoulder, not elsewhere classified: Secondary | ICD-10-CM

## 2015-12-24 DIAGNOSIS — G8929 Other chronic pain: Secondary | ICD-10-CM

## 2015-12-24 DIAGNOSIS — R262 Difficulty in walking, not elsewhere classified: Secondary | ICD-10-CM

## 2015-12-24 DIAGNOSIS — M6281 Muscle weakness (generalized): Secondary | ICD-10-CM

## 2015-12-24 DIAGNOSIS — M25562 Pain in left knee: Secondary | ICD-10-CM

## 2015-12-24 NOTE — Therapy (Signed)
[place], [place], [address] Phone: [phone]   Fax:  [phone]  Physical Therapy Treatment  Patient Details  Name: [name] MRN: [id number] Date of Birth: [birth year] Referring Provider: [name], DO   Encounter Date: 12/24/2015      PT End of Session - 12/24/15 1840    Visit Number 13   Number of Visits 16   Date for PT Re-Evaluation 12/29/15   PT Start Time 2637   PT Stop Time 1430   PT Time Calculation (min) 42 min   Activity Tolerance Patient tolerated treatment well   Behavior During Therapy [facility] for tasks assessed/performed      Past Medical History:  Diagnosis Date  . CHF (congestive heart failure) ([place])   . Chronic anemia   . Chronic diastolic CHF (congestive heart failure) ([place]) 10/03/2013  . Chronic GI bleeding    [name] 11/29/2014  . Family history of anesthesia complication    "niece has a hard time coming out" (09/15/2012)  . Frequent nosebleeds    chronic  . Gastric AV malformation    [name] 11/29/2014  . GERD (gastroesophageal reflux disease)   . Heart murmur   . History of blood transfusion "several"  . History of epistaxis   . HTN (hypertension), benign 03/02/2012  . Hyperlipidemia   . Iron deficiency anemia    chronic infusions"  . Lichen planus    Both lower extremities  . 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 Pneumonia 1990's X 2  . Seizures ([place]) 09/2014  . Telangiectasia    Gastric   . 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    Past Surgical History:  Procedure Laterality Date  . CATARACT EXTRACTION     "I think it was just one eye"  . ESOPHAGOGASTRODUODENOSCOPY  02/26/2011   Procedure: ESOPHAGOGASTRODUODENOSCOPY (EGD);  Surgeon: [name], MD;  Location: [name];  Service: Endoscopy;  Laterality: N/A;  . ESOPHAGOGASTRODUODENOSCOPY N/A 11/08/2012   Procedure: ESOPHAGOGASTRODUODENOSCOPY (EGD);  Surgeon:  [name], MD;  Location: [name];  Service: Endoscopy;  Laterality: N/A;  . ESOPHAGOGASTRODUODENOSCOPY N/A 10/04/2013   Procedure: ESOPHAGOGASTRODUODENOSCOPY (EGD);  Surgeon: Winfield Cunas., MD;  Location: [name];  Service: Endoscopy;  Laterality: N/A;  with APC on stand-by  . ESOPHAGOGASTRODUODENOSCOPY N/A 07/06/2014   Procedure: ESOPHAGOGASTRODUODENOSCOPY (EGD);  Surgeon: Clarene Essex, MD;  Location: [name];  Service: Endoscopy;  Laterality: N/A;  . ESOPHAGOGASTRODUODENOSCOPY N/A 09/05/2014   Procedure: ESOPHAGOGASTRODUODENOSCOPY (EGD);  Surgeon: Laurence Spates, MD;  Location: [name];  Service: Endoscopy;  Laterality: N/A;  APC on standby to control bleeding  . ESOPHAGOGASTRODUODENOSCOPY N/A 11/29/2014   Procedure: ESOPHAGOGASTRODUODENOSCOPY (EGD);  Surgeon: [name], MD;  Location: [facility] ENDOSCOPY;  Service: Endoscopy;  Laterality: N/A;  . ESOPHAGOGASTRODUODENOSCOPY N/A 09/28/2015   Procedure: ESOPHAGOGASTRODUODENOSCOPY (EGD);  Surgeon: Clarene Essex, MD;  Location: [facility] ENDOSCOPY;  Service: Endoscopy;  Laterality: N/A;  . ESOPHAGOGASTRODUODENOSCOPY ENDOSCOPY  08/19/2006   with laser treatment  . HOT HEMOSTASIS N/A 07/06/2014   Procedure: HOT HEMOSTASIS (ARGON PLASMA COAGULATION/BICAP);  Surgeon: Clarene Essex, MD;  Location: [name];  Service: Endoscopy;  Laterality: N/A;  . HOT HEMOSTASIS N/A 09/28/2015   Procedure: HOT HEMOSTASIS (ARGON PLASMA COAGULATION/BICAP);  Surgeon: Clarene Essex, MD;  Location: [facility] ENDOSCOPY;  Service: Endoscopy;  Laterality: N/A;  . NASAL HEMORRHAGE CONTROL     "for bleeding"   . SAVORY DILATION  02/26/2011   Procedure: SAVORY  DILATION;  Surgeon: [name], MD;  Location: [name];  Service: Endoscopy;  Laterality: N/A;  c-arm needed    There were no vitals filed for this visit.      Subjective Assessment - 12/24/15 1349    Subjective Legs are sore.  May be from stretching them.  Able to sleep on her right shoulder some.     Currently in Pain? Yes   Pain Score 8    Pain Orientation Left;Right   Pain Descriptors / Indicators Aching;Sore   Pain Type Chronic pain   Aggravating Factors  sitting too long   Pain Relieving Factors rubbibng them down,  massage. working them   Pain Score 4   Pain Orientation Right  upper trap   Pain Descriptors / Indicators Sore   Pain Frequency Intermittent   Aggravating Factors  worse at night,    Pain Relieving Factors resting area with a small pillow            [facility] PT Assessment - 12/24/15 0001      Berg Balance Test   Merrilee Jansky comment: 48/56                     [facility] Adult PT Treatment/Exercise - 12/24/15 0001      Berg Balance Test   Sit to Stand Able to stand without using hands and stabilize independently   Standing Unsupported Able to stand safely 2 minutes   Sitting with Back Unsupported but Feet Supported on Floor or Stool Able to sit safely and securely 2 minutes   Stand to Sit Sits safely with minimal use of hands   Transfers Able to transfer safely, minor use of hands   Standing Unsupported with Eyes Closed Able to stand 10 seconds safely   Standing Ubsupported with Feet Together Able to place feet together independently and stand 1 minute safely   From Standing, Reach Forward with Outstretched Arm Can reach confidently >25 cm (10")   From Standing Position, Pick up Object from Floor Able to pick up shoe safely and easily   From Standing Position, Turn to Look Behind Over each Shoulder Looks behind one side only/other side shows less weight shift   Turn 360 Degrees Able to turn 360 degrees safely in 4 seconds or less   Standing Unsupported, Alternately Place Feet on Step/Stool Able to stand independently and safely and complete 8 steps in 20 seconds   Standing Unsupported, One Foot in [employer] balance while stepping or standing   Standing on One Leg Tries to lift leg/unable to hold 3 seconds but remains standing independently   Total Score 48      Moist Heat Therapy   Number Minutes Moist Heat 15 Minutes   Moist Heat Location --  shoulder, knees in sitting                  PT Short Term Goals - 12/24/15 1843      PT SHORT TERM GOAL #1   Title Pt will be independent with her HEP in order to improve functional mobility.    Time 4   Period Weeks   Status Unable to assess     PT SHORT TERM GOAL #2   Title Pt will be able to demonstrate bilateral arm swing with gait.    Status Achieved           PT Long Term Goals - 12/24/15 1843      PT LONG TERM GOAL #  1   Title pt will improve her FOTO score from 38% limitation to </= 35% limitation   Baseline 44% limitation   Time 8   Period Weeks   Status On-going     PT LONG TERM GOAL #2   Title Patient will be able to stand for up to 30 min in her home in order to prepare meal, clean up with min pain in knee, legs.     Baseline 20 minutes with 4/10 pain   Time 4   Period Weeks   Status On-going     PT LONG TERM GOAL #3   Title Pt will improve R shoulder strength to >/=4/5 in order to improve functional mobiltiy, reaching, and lifting.    Time 4   Period Weeks   Status Achieved     PT LONG TERM GOAL #4   Title Pt will improve her Berg balance score to 52/56 or more to decrease risk of falls.     Baseline 48/56   Time 4   Period Weeks   Status On-going     PT LONG TERM GOAL #5   Title Pt will increase hamstring and glute strength to 4/5 or more for improved stability with gait.    Time 4   Period Weeks   Status Unable to assess               Plan - 12/24/15 1840    Clinical Impression Statement Short session, patient was late.  FOTO done with 44% limitation,  She had 38 % limitation at intake. BERG 48/56 which improved points.     PT Next Visit Plan 16th will be her last day . Functional strength, gait and balance . Finalized HEP .  Finish POC,    PT Home Exercise Plan Check goals, review results of BERG.  FOTO already done. answer any HEP  questions   Consulted and Agree with Plan of Care Patient      Patient will benefit from skilled therapeutic intervention in order to improve the following deficits and impairments:  Abnormal gait, Difficulty walking, Impaired UE functional use, Pain, Postural dysfunction, Decreased strength, Decreased mobility, Impaired flexibility, Improper body mechanics, Decreased activity tolerance, Decreased balance, Impaired perceived functional ability  Visit Diagnosis: Right shoulder pain, unspecified chronicity  Stiffness of right shoulder, not elsewhere classified  Muscle weakness (generalized)  Chronic pain of left knee  Difficulty in walking, not elsewhere classified  Other abnormalities of gait and mobility     Problem List Patient Active Problem List   Diagnosis Date Noted  . Pain in joint of right shoulder 10/22/2015  . PAF (paroxysmal atrial fibrillation) ([place]) 09/29/2015  . Lichen planus 44/02/270  . Healthcare maintenance 06/20/2015  . Seborrheic keratosis 06/20/2015  . Memory loss 01/30/2015  . Simple febrile convulsions ([place]) 10/21/2014  . Hyperammonemia ([place]) 10/03/2014  . Chronic kidney disease, stage III (moderate)   . Chronic diastolic CHF (congestive heart failure) ([place]) 10/03/2013  . DM (diabetes mellitus), type 2, uncontrolled, with renal complications ([place]) 53/66/4403  . HTN (hypertension), benign 03/02/2012  . 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

## 2015-12-26 ENCOUNTER — Ambulatory Visit: Payer: Medicare Other | Admitting: Physical Therapy

## 2015-12-26 DIAGNOSIS — M6281 Muscle weakness (generalized): Secondary | ICD-10-CM

## 2015-12-26 DIAGNOSIS — M25511 Pain in right shoulder: Secondary | ICD-10-CM | POA: Diagnosis not present

## 2015-12-26 DIAGNOSIS — R2689 Other abnormalities of gait and mobility: Secondary | ICD-10-CM

## 2015-12-26 DIAGNOSIS — M25562 Pain in left knee: Secondary | ICD-10-CM

## 2015-12-26 DIAGNOSIS — G8929 Other chronic pain: Secondary | ICD-10-CM

## 2015-12-26 DIAGNOSIS — R262 Difficulty in walking, not elsewhere classified: Secondary | ICD-10-CM

## 2015-12-26 DIAGNOSIS — M25611 Stiffness of right shoulder, not elsewhere classified: Secondary | ICD-10-CM

## 2015-12-26 NOTE — Therapy (Addendum)
[place], [place], [address] Phone: [phone]   Fax:  [phone]  Physical Therapy Treatment/Discharge  Patient Details  Name: [name] MRN: [id number] Date of Birth: July 16, 1940 Referring Provider: [name], DO   Encounter Date: 12/26/2015      PT End of Session - 12/26/15 1035    Visit Number 14   Number of Visits 16   Date for PT Re-Evaluation 12/29/15   PT Start Time 1034   PT Stop Time 1115   PT Time Calculation (min) 41 min   Activity Tolerance Patient tolerated treatment well   Behavior During Therapy [facility] for tasks assessed/performed      Past Medical History:  Diagnosis Date  . CHF (congestive heart failure) ([place])   . Chronic anemia   . Chronic diastolic CHF (congestive heart failure) ([place]) 10/03/2013  . Chronic GI bleeding    [name] 11/29/2014  . Family history of anesthesia complication    "niece has a hard time coming out" (09/15/2012)  . Frequent nosebleeds    chronic  . Gastric AV malformation    [name] 11/29/2014  . GERD (gastroesophageal reflux disease)   . Heart murmur   . History of blood transfusion "several"  . History of epistaxis   . HTN (hypertension), benign 03/02/2012  . Hyperlipidemia   . Iron deficiency anemia    chronic infusions"  . Lichen planus    Both lower extremities  . 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 Pneumonia 1990's X 2  . Seizures ([place]) 09/2014  . Telangiectasia    Gastric   . 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    Past Surgical History:  Procedure Laterality Date  . CATARACT EXTRACTION     "I think it was just one eye"  . ESOPHAGOGASTRODUODENOSCOPY  02/26/2011   Procedure: ESOPHAGOGASTRODUODENOSCOPY (EGD);  Surgeon: [name], MD;  Location: [name];  Service: Endoscopy;  Laterality: N/A;  . ESOPHAGOGASTRODUODENOSCOPY N/A 11/08/2012   Procedure: ESOPHAGOGASTRODUODENOSCOPY (EGD);   Surgeon: [name], MD;  Location: [name];  Service: Endoscopy;  Laterality: N/A;  . ESOPHAGOGASTRODUODENOSCOPY N/A 10/04/2013   Procedure: ESOPHAGOGASTRODUODENOSCOPY (EGD);  Surgeon: Winfield Cunas., MD;  Location: [name];  Service: Endoscopy;  Laterality: N/A;  with APC on stand-by  . ESOPHAGOGASTRODUODENOSCOPY N/A 07/06/2014   Procedure: ESOPHAGOGASTRODUODENOSCOPY (EGD);  Surgeon: Clarene Essex, MD;  Location: [name];  Service: Endoscopy;  Laterality: N/A;  . ESOPHAGOGASTRODUODENOSCOPY N/A 09/05/2014   Procedure: ESOPHAGOGASTRODUODENOSCOPY (EGD);  Surgeon: Laurence Spates, MD;  Location: [name];  Service: Endoscopy;  Laterality: N/A;  APC on standby to control bleeding  . ESOPHAGOGASTRODUODENOSCOPY N/A 11/29/2014   Procedure: ESOPHAGOGASTRODUODENOSCOPY (EGD);  Surgeon: [name], MD;  Location[facility] ENDOSCOPY;  Service: Endoscopy;  Laterality: N/A;  . ESOPHAGOGASTRODUODENOSCOPY N/A 09/28/2015   Procedure: ESOPHAGOGASTRODUODENOSCOPY (EGD);  Surgeon: Clarene Essex, MD;  Location: [facility] ENDOSCOPY;  Service: Endoscopy;  Laterality: N/A;  . ESOPHAGOGASTRODUODENOSCOPY ENDOSCOPY  08/19/2006   with laser treatment  . HOT HEMOSTASIS N/A 07/06/2014   Procedure: HOT HEMOSTASIS (ARGON PLASMA COAGULATION/BICAP);  Surgeon: Clarene Essex, MD;  Location: [name];  Service: Endoscopy;  Laterality: N/A;  . HOT HEMOSTASIS N/A 09/28/2015   Procedure: HOT HEMOSTASIS (ARGON PLASMA COAGULATION/BICAP);  Surgeon: Clarene Essex, MD;  Location: [facility] ENDOSCOPY;  Service: Endoscopy;  Laterality: N/A;  . NASAL HEMORRHAGE CONTROL     "for bleeding"   . SAVORY DILATION  02/26/2011   Procedure: SAVORY  DILATION;  Surgeon: [name], MD;  Location: [name];  Service: Endoscopy;  Laterality: N/A;  c-arm needed    There were no vitals filed for this visit.      Subjective Assessment - 12/26/15 1035    Subjective Legs are stiff feeing today.  Pt confused of time of appt. Daughter will take her to  [place].  Wants to finish POC.    Currently in Pain? Yes   Pain Score 4    Pain Location Leg   Pain Orientation Right;Left   Pain Descriptors / Indicators Tightness;Sore   Pain Type Chronic pain   Pain Onset More than a month ago   Pain Frequency Intermittent   Aggravating Factors  overactvity, sitting too long (tried to stay moving)    Pain Relieving Factors rubbing them, gentle exercise                         [facility] Adult PT Treatment/Exercise - 12/26/15 1050      Lumbar Exercises: Stretches   Active Hamstring Stretch 3 reps;30 seconds   Active Hamstring Stretch Limitations seated    Lower Trunk Rotation 5 reps;10 seconds   Pelvic Tilt 5 reps   Pelvic Tilt Limitations 10 sec      Lumbar Exercises: Aerobic   Stationary Bike 6 min UE and LE level 5      Lumbar Exercises: Supine   Ab Set 5 reps   Clam 10 reps   Bent Knee Raise 10 reps     Knee/Hip Exercises: Supine   Bridges Strengthening;Both;1 set;15 reps     Moist Heat Therapy   Number Minutes Moist Heat 15 Minutes   Moist Heat Location Knee                PT Education - 12/26/15 1043    Education provided Yes   Education Details Final HEP, DC after next visit    Person(s) Educated Patient   Methods Explanation   Comprehension Verbalized understanding;Returned demonstration;Need further instruction          PT Short Term Goals - 12/24/15 1843      PT SHORT TERM GOAL #1   Title Pt will be independent with her HEP in order to improve functional mobility.    Time 4   Period Weeks   Status Unable to assess     PT SHORT TERM GOAL #2   Title Pt will be able to demonstrate bilateral arm swing with gait.    Status Achieved           PT Long Term Goals - 12/24/15 1843      PT LONG TERM GOAL #1   Title pt will improve her FOTO score from 38% limitation to </= 35% limitation   Baseline 44% limitation   Time 8   Period Weeks   Status On-going     PT LONG TERM GOAL #2    Title Patient will be able to stand for up to 30 min in her home in order to prepare meal, clean up with min pain in knee, legs.     Baseline 20 minutes with 4/10 pain   Time 4   Period Weeks   Status On-going     PT LONG TERM GOAL #3   Title Pt will improve R shoulder strength to >/=4/5 in order to improve functional mobiltiy, reaching, and lifting.    Time 4   Period Weeks   Status  Achieved     PT LONG TERM GOAL #4   Title Pt will improve her Berg balance score to 52/56 or more to decrease risk of falls.     Baseline 48/56   Time 4   Period Weeks   Status On-going     PT LONG TERM GOAL #5   Title Pt will increase hamstring and glute strength to 4/5 or more for improved stability with gait.    Time 4   Period Weeks   Status Unable to assess               Plan - 12/26/15 1044    Clinical Impression Statement Pt late again today.  Needs cues for HEP. [facility]  Legs sore but able to do HEP without pain increase.     PT Next Visit Plan Functional strength, gait and balance . Finalized HEP .  Finish POC,    PT Home Exercise Plan LE: bridge, single knee to chest, trunk rotation and hamstring.  Needs cues.  Does not do.    Consulted and Agree with Plan of Care Patient      Patient will benefit from skilled therapeutic intervention in order to improve the following deficits and impairments:  Abnormal gait, Difficulty walking, Impaired UE functional use, Pain, Postural dysfunction, Decreased strength, Decreased mobility, Impaired flexibility, Improper body mechanics, Decreased activity tolerance, Decreased balance, Impaired perceived functional ability  Visit Diagnosis: Right shoulder pain, unspecified chronicity  Stiffness of right shoulder, not elsewhere classified  Muscle weakness (generalized)  Chronic pain of left knee  Difficulty in walking, not elsewhere classified  Other abnormalities of gait and mobility     Problem List Patient Active  Problem List   Diagnosis Date Noted  . Pain in joint of right shoulder 10/22/2015  . PAF (paroxysmal atrial fibrillation) ([place]) 09/29/2015  . Lichen planus 09/62/8366  . Healthcare maintenance 06/20/2015  . Seborrheic keratosis 06/20/2015  . Memory loss 01/30/2015  . Simple febrile convulsions ([place]) 10/21/2014  . Hyperammonemia ([place]) 10/03/2014  . Chronic kidney disease, stage III (moderate)   . Chronic diastolic CHF (congestive heart failure) ([place]) 10/03/2013  . DM (diabetes mellitus), type 2, uncontrolled, with renal complications ([place]) 29/47/6546  . HTN (hypertension), benign 03/02/2012  . 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  Was seen for leg pain which also improved.  Did not finish POC    Education / Equipment: HEP , balance, posture  Plan: Patient agrees to discharge.  Patient goals were partially met. Patient is being discharged due to not returning since the last visit.  ?????   Raeford Razor, PT 02/11/16 3:35 PM Phone: [phone] Fax: [phone]

## 2015-12-26 NOTE — Patient Instructions (Signed)
Bridging    Slowly raise buttocks from floor, keeping stomach tight. Repeat _10___ times per set. Do _1-2__ sets per session. Do __2__ sessions per day.  http://orth.exer.us/1097   Copyright  VHI. All rights reserved.  Lower Trunk Rotation Stretch    Keeping back flat and feet together, rotate knees to left side. Hold __10-15__ seconds. Repeat ___10_ times per set. Do 2__ sets per session. Do _2___ sessions per day.  http://orth.exer.us/123   Copyright  VHI. All rights reserved.    Knee to Chest (Flexion)    Pull knee toward chest. Feel stretch in lower back or buttock area. Breathing deeply, Hold __30 __ seconds. Repeat with other knee. Repeat _3___ times. Do _2___ sessions per day.  http://gt2.exer.us/226   Copyright  VHI. All rights reserved.

## 2015-12-27 ENCOUNTER — Telehealth: Payer: Self-pay | Admitting: Dietician

## 2015-12-27 DIAGNOSIS — E1129 Type 2 diabetes mellitus with other diabetic kidney complication: Secondary | ICD-10-CM

## 2015-12-27 DIAGNOSIS — E1165 Type 2 diabetes mellitus with hyperglycemia: Principal | ICD-10-CM

## 2015-12-27 DIAGNOSIS — IMO0002 Reserved for concepts with insufficient information to code with codable children: Secondary | ICD-10-CM

## 2015-12-27 DIAGNOSIS — Z794 Long term (current) use of insulin: Principal | ICD-10-CM

## 2015-12-27 MED ORDER — GLUCOSE BLOOD VI STRP: ORAL_STRIP | 12 refills | Status: DC

## 2015-12-27 MED ORDER — ONETOUCH DELICA LANCETS FINE MISC: 12 refills | Status: DC

## 2015-12-27 MED ORDER — ONETOUCH VERIO W/DEVICE KIT: 1.0000 | PACK | Freq: Three times a day (TID) | 1 refills | Status: DC

## 2015-12-27 NOTE — Telephone Encounter (Signed)
Agree with plan.  Orders signed.  Thank you Butch Penny!

## 2015-12-27 NOTE — Telephone Encounter (Signed)
Husband says [name] has a one touch verio meter and that they put new batteries in it, but it still doesn't work He'd like [place] to send a prescription to their drug store. (They went to the pharmacy and were told to get a prescription from their doctor. ) Request prescription for new meter and supplies.  [name] would like to speak to a nurse about her legs. Informed triage nurse

## 2015-12-30 ENCOUNTER — Other Ambulatory Visit: Payer: Self-pay | Admitting: Oncology

## 2015-12-30 ENCOUNTER — Ambulatory Visit (INDEPENDENT_AMBULATORY_CARE_PROVIDER_SITE_OTHER): Payer: Medicare Other | Admitting: Internal Medicine

## 2015-12-30 ENCOUNTER — Encounter: Payer: Self-pay | Admitting: Internal Medicine

## 2015-12-30 ENCOUNTER — Telehealth: Payer: Self-pay | Admitting: *Deleted

## 2015-12-30 DIAGNOSIS — Q2733 Arteriovenous malformation of digestive system vessel: Secondary | ICD-10-CM

## 2015-12-30 DIAGNOSIS — D500 Iron deficiency anemia secondary to blood loss (chronic): Secondary | ICD-10-CM

## 2015-12-30 DIAGNOSIS — M25561 Pain in right knee: Secondary | ICD-10-CM

## 2015-12-30 DIAGNOSIS — Z86010 Personal history of colonic polyps: Secondary | ICD-10-CM

## 2015-12-30 DIAGNOSIS — K31811 Angiodysplasia of stomach and duodenum with bleeding: Secondary | ICD-10-CM

## 2015-12-30 DIAGNOSIS — M25562 Pain in left knee: Secondary | ICD-10-CM | POA: Diagnosis not present

## 2015-12-30 DIAGNOSIS — I780 Hereditary hemorrhagic telangiectasia: Secondary | ICD-10-CM

## 2015-12-30 DIAGNOSIS — E1129 Type 2 diabetes mellitus with other diabetic kidney complication: Secondary | ICD-10-CM

## 2015-12-30 DIAGNOSIS — M898X0 Other specified disorders of bone, multiple sites: Secondary | ICD-10-CM

## 2015-12-30 DIAGNOSIS — M791 Myalgia: Secondary | ICD-10-CM

## 2015-12-30 DIAGNOSIS — I739 Peripheral vascular disease, unspecified: Secondary | ICD-10-CM | POA: Insufficient documentation

## 2015-12-30 DIAGNOSIS — L439 Lichen planus, unspecified: Secondary | ICD-10-CM

## 2015-12-30 DIAGNOSIS — M25511 Pain in right shoulder: Secondary | ICD-10-CM

## 2015-12-30 DIAGNOSIS — N289 Disorder of kidney and ureter, unspecified: Secondary | ICD-10-CM

## 2015-12-30 DIAGNOSIS — IMO0002 Reserved for concepts with insufficient information to code with codable children: Secondary | ICD-10-CM

## 2015-12-30 DIAGNOSIS — M898X9 Other specified disorders of bone, unspecified site: Secondary | ICD-10-CM

## 2015-12-30 DIAGNOSIS — E1165 Type 2 diabetes mellitus with hyperglycemia: Secondary | ICD-10-CM

## 2015-12-30 DIAGNOSIS — Z794 Long term (current) use of insulin: Secondary | ICD-10-CM

## 2015-12-30 DIAGNOSIS — M898X6 Other specified disorders of bone, lower leg: Secondary | ICD-10-CM | POA: Diagnosis not present

## 2015-12-30 DIAGNOSIS — K31819 Angiodysplasia of stomach and duodenum without bleeding: Secondary | ICD-10-CM

## 2015-12-30 LAB — CBC WITH DIFFERENTIAL/PLATELET
BASOS PCT: 0 %
Basophils Absolute: 0 10*3/uL (ref 0.0–0.1)
EOS ABS: 0.2 10*3/uL (ref 0.0–0.7)
EOS PCT: 2 %
HCT: 19.2 % — ABNORMAL LOW (ref 36.0–46.0)
HEMOGLOBIN: 6.1 g/dL — AB (ref 12.0–15.0)
LYMPHS ABS: 0.6 10*3/uL — AB (ref 0.7–4.0)
Lymphocytes Relative: 10 %
MCH: 28.6 pg (ref 26.0–34.0)
MCHC: 31.8 g/dL (ref 30.0–36.0)
MCV: 90.1 fL (ref 78.0–100.0)
MONOS PCT: 6 %
Monocytes Absolute: 0.4 10*3/uL (ref 0.1–1.0)
NEUTROS PCT: 81 %
Neutro Abs: 5.2 10*3/uL (ref 1.7–7.7)
PLATELETS: 181 10*3/uL (ref 150–400)
RBC: 2.13 MIL/uL — ABNORMAL LOW (ref 3.87–5.11)
RDW: 15.9 % — ABNORMAL HIGH (ref 11.5–15.5)
WBC: 6.4 10*3/uL (ref 4.0–10.5)

## 2015-12-30 MED ORDER — DICLOFENAC SODIUM 1 % TD GEL: 2.0000 g | Freq: Four times a day (QID) | TRANSDERMAL | 1 refills | Status: DC

## 2015-12-30 NOTE — Progress Notes (Signed)
   CC: leg pain for months   HPI: [name] is a 75 y.o. with past medical history as outlined below who presents to acute care clinic for new leg pain. About two and a half months ago she developed bilateral leg pain which has gradually worsened. The pain is in the muscles over the back of her thigh. She has not found anything that exacerbates the pain. Working with physical therapy has not alleviated the pain. Massaging her legs does help. Pain is associated with weakness and stiffness which starts in the morning and last throughout the day (>1 hour) but improves some as she moves. 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   Please see problem list for status of the pt's chronic medical problems.  Past Medical History:  Diagnosis Date  . CHF (congestive heart failure) ([place])   . Chronic anemia   . Chronic diastolic CHF (congestive heart failure) ([place]) 10/03/2013  . Chronic GI bleeding    [name] 11/29/2014  . Family history of anesthesia complication    "niece has a hard time coming out" (09/15/2012)  . Frequent nosebleeds    chronic  . Gastric AV malformation    [name] 11/29/2014  . GERD (gastroesophageal reflux disease)   . Heart murmur   . History of blood transfusion "several"  . History of epistaxis   . HTN (hypertension), benign 03/02/2012  . Hyperlipidemia   . Iron deficiency anemia    chronic infusions"  . Lichen planus    Both lower extremities  . 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 Pneumonia 1990's X 2  . Seizures ([place]) 09/2014  . Telangiectasia    Gastric   . 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   Review of Systems:  Please see each problem below for a pertinent review of systems. ROS  Physical Exam:  Vitals:   12/30/15 1350  BP: (!) 129/38  Pulse: 83  Temp: 99.2 F (37.3 C)  TempSrc: Oral  SpO2: 96%  Weight: 144 lb 4.8 oz (65.5 kg)  Height: 5\' 3"  (1.6 m)    Physical Exam  Cardiovascular: Normal rate and regular rhythm.   4/6 systolic murmur best heart over the right sternal border  Pulmonary/Chest: Effort normal and breath sounds normal. She has no wheezes. She has no rales.  Abdominal: Soft. Bowel sounds are normal. She exhibits no distension. There is no tenderness. There is no guarding.  Musculoskeletal:  Right shoulder AC joint tenderness. Upper extremity ROM and strength intact.  Bilateral knee pain and crepitus with flexion. No joint line tenderness, swelling, or erythema.  No hip pain or limited ROM. Negative straight leg raise. Thigh muscles mildly tender to palpation.  Shin tenderness  Skin: No pallor.    Assessment & Plan:   See Encounters Tab for problem based charting.  Patient seen with Dr. Daryll Drown

## 2015-12-30 NOTE — Assessment & Plan Note (Addendum)
Says her glucometer hasn't been working correctly. When she uses her machine the results are in 400-500s but when she uses her husbands machine it reads 180-229. She brought her glucometer with her today:  Fasting 173-560  Post meal 62-536  Evening 86-500  It is difficult to analyze these readings given, her machine may be misreading her sugar. She says that metformin caused her to loose weight and refuses to try taking it again. I am not comfortable with changing her insulin without more accurate information about her blood sugar. She will likely need an increase in her medications at the time of follow up.   Prescribed a new glucometer  Will continue her current regiment : glipizide 2.5 mg daily and Levemir Flexpen 20 units BID Will follow up in clinic in 2 weeks with readings from new glucometer

## 2015-12-30 NOTE — Telephone Encounter (Signed)
Call from lab - panic Hgb 6.1.  Called to Dr [name] - will need transfusion. Only location w/ available appts is [facility]; at 0800 am tomorrow.  Pt called and informed to appt and of hgb result- stated her husband will be able to take her tomorrow at 0800 AM at the [facility] ( she has been there before).  Called Dr Darnell Level back - informed of pt's appt. Stated he will put in orders.

## 2015-12-30 NOTE — Patient Instructions (Addendum)
It was a pleasure to meet you today Ms. Siwik   For your knee and shoulder pain- I have sent a prescription for voltaren gel to [facility], try using this on your joints that hurt   I will call you with the results of your lab test   Schedule a follow up appointment in 2 weeks

## 2015-12-30 NOTE — Assessment & Plan Note (Signed)
Reports dark brown stool for the past 2 weeks, no frank blood. She has been feeling more lethargic and weak lately. Hemoglobin today 6.1. 08/2014 EGD showed gastric AVMs and colonoscopy had a polyp which she states was benign and needs follow up in 5 years.   Arrangements made by our nursing staff for transfusion at the [facility] cell clinic first thing tomorrow morning

## 2015-12-30 NOTE — Assessment & Plan Note (Signed)
Still experiencing the same right shoulder pain. Tender to palpation over right AC joint. Had not picked up the voltaren gel.   Reordered voltaren gel

## 2015-12-30 NOTE — Assessment & Plan Note (Addendum)
HPI About two and a half months ago she developed bilateral leg pain which has gradually worsened. The pain is in the muscles over the back of her thigh. She has not found anything that exacerbates the pain. Working with physical therapy has not alleviated the pain. Massaging her legs does help. Pain is associated with weakness and stiffness which starts in the morning and last throughout the day (>1 hour) but improves some as she moves. 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 She has been prescribed voltaren gel in the past when she presented with shoulder tendinopathy but did not know about this prescription.   A Exam is negative for hip pain, negative straight leg, she does have knee pain on flexion and has bone pain of her lower legs. Bone pain could be related to vitamin D deficiency or statin myopathy. 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 Knee pain and crepitus is most likely due to osteoarthritis.   P Ordered ESR, CRP, CK, vitamin D  Ordered bilateral knee xrays  Reordered voltaren gel Follow up in 2 weeks   Addendum: low Vitamin D 27.6 . Called to discuss with patient, will send in a prescription for calcium citrate- Vitamin D 400 units daily as she also had a low calcium on last BMP.

## 2015-12-31 ENCOUNTER — Other Ambulatory Visit: Payer: Medicare Other

## 2015-12-31 ENCOUNTER — Ambulatory Visit (HOSPITAL_COMMUNITY)
Admission: RE | Admit: 2015-12-31 | Discharge: 2015-12-31 | Disposition: A | Discharge status: Home / Self Care | Payer: Medicare Other | Source: Ambulatory Visit | Attending: Oncology | Admitting: Oncology

## 2015-12-31 ENCOUNTER — Ambulatory Visit: Payer: Medicare Other | Admitting: Physical Therapy

## 2015-12-31 DIAGNOSIS — D500 Iron deficiency anemia secondary to blood loss (chronic): Secondary | ICD-10-CM | POA: Diagnosis not present

## 2015-12-31 DIAGNOSIS — K31811 Angiodysplasia of stomach and duodenum with bleeding: Secondary | ICD-10-CM | POA: Diagnosis present

## 2015-12-31 DIAGNOSIS — I780 Hereditary hemorrhagic telangiectasia: Secondary | ICD-10-CM | POA: Insufficient documentation

## 2015-12-31 LAB — CK: CK TOTAL: 39 U/L (ref 24–173)

## 2015-12-31 LAB — VITAMIN D 25 HYDROXY (VIT D DEFICIENCY, FRACTURES): VIT D 25 HYDROXY: 27.2 ng/mL — AB (ref 30.0–100.0)

## 2015-12-31 LAB — C-REACTIVE PROTEIN: CRP: 8.4 mg/L — ABNORMAL HIGH (ref 0.0–4.9)

## 2015-12-31 LAB — SEDIMENTATION RATE: Sed Rate: 17 mm/hr (ref 0–40)

## 2015-12-31 LAB — PREPARE RBC (CROSSMATCH)

## 2015-12-31 MED ORDER — SODIUM CHLORIDE 0.9% FLUSH
10.0000 mL | INTRAVENOUS | Status: AC | PRN
  Administered 2015-12-31: 10 mL

## 2015-12-31 MED ORDER — SODIUM CHLORIDE 0.9 % IV SOLN
Freq: Once | INTRAVENOUS | Status: AC
  Administered 2015-12-31: 10:00:00 via INTRAVENOUS

## 2015-12-31 MED ORDER — HEPARIN SOD (PORK) LOCK FLUSH 100 UNIT/ML IV SOLN
500.0000 [IU] | INTRAVENOUS | Status: AC | PRN
  Administered 2015-12-31: 500 [IU]
  Filled 2015-12-31: qty 5

## 2015-12-31 MED ORDER — CALCIUM CITRATE-VITAMIN D 315-200 MG-UNIT PO TABS: 1.0000 | ORAL_TABLET | Freq: Two times a day (BID) | ORAL | 0 refills | Status: DC

## 2015-12-31 NOTE — Addendum Note (Signed)
Addended by: Meryl Dare on: 12/31/2015 02:24 PM   Modules accepted: Orders

## 2015-12-31 NOTE — Discharge Instructions (Signed)

## 2015-12-31 NOTE — Progress Notes (Signed)
Patient ID: [name], female   DOB: [birth year], 75 y.o.   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  Patient tolerated procedure well. Went over discharge instructions and copy given to patient. Alert,oriented and ambulatory at time of discharge. Discharged home via [facility] with husband.

## 2016-01-01 LAB — TYPE AND SCREEN
ABO/RH(D): A POS
Antibody Screen: NEGATIVE
Unit division: 0
Unit division: 0

## 2016-01-05 NOTE — Progress Notes (Signed)
Internal Medicine Clinic Attending  I saw and evaluated the patient.  I personally confirmed the key portions of the history and exam documented by Dr. Hetty Ely and I reviewed pertinent patient test results.  The assessment, diagnosis, and plan were formulated together and I agree with the documentation in the resident's note.  Hgb result called in to Dr. [name] and blood transfusion arranged as noted in Dr. Fredrik Cove note.

## 2016-01-06 ENCOUNTER — Telehealth: Payer: Self-pay

## 2016-01-06 NOTE — Telephone Encounter (Signed)
Called pt, she was asleep, her spouse states she is having muscle cramps real bad and wants to know if there is something that can be done, he does not want to wake her because she did not rest last pm

## 2016-01-06 NOTE — Telephone Encounter (Signed)
Needs to speak with a nurse regarding pain all over the body.

## 2016-01-06 NOTE — Telephone Encounter (Signed)
i'd recommend she come back and be seen in [facility].

## 2016-01-07 NOTE — Telephone Encounter (Signed)
appt made for wed [address]

## 2016-01-08 ENCOUNTER — Encounter: Payer: Self-pay | Admitting: Internal Medicine

## 2016-01-08 ENCOUNTER — Ambulatory Visit (INDEPENDENT_AMBULATORY_CARE_PROVIDER_SITE_OTHER): Payer: Medicare Other | Admitting: Internal Medicine

## 2016-01-08 VITALS — BP 123/31 | HR 67 | Temp 97.9°F | Ht 63.0 in | Wt 146.1 lb

## 2016-01-08 DIAGNOSIS — M79604 Pain in right leg: Secondary | ICD-10-CM | POA: Diagnosis not present

## 2016-01-08 DIAGNOSIS — I780 Hereditary hemorrhagic telangiectasia: Secondary | ICD-10-CM | POA: Diagnosis not present

## 2016-01-08 DIAGNOSIS — M79605 Pain in left leg: Secondary | ICD-10-CM

## 2016-01-08 DIAGNOSIS — D500 Iron deficiency anemia secondary to blood loss (chronic): Secondary | ICD-10-CM

## 2016-01-08 MED ORDER — PRAMIPEXOLE DIHYDROCHLORIDE 0.125 MG PO TABS: 0.1250 mg | ORAL_TABLET | Freq: Three times a day (TID) | ORAL | 2 refills | Status: DC

## 2016-01-08 NOTE — Assessment & Plan Note (Signed)
It is unclear what is causing her leg pain as patient has a difficult time describing it. Topical treatment with voltaren gel does not seem to provide relief. Given her hx of chronic iron deficiency anemia due to chronic blood loss related to hereditary hemorrhagic telangiectasias I wonder if she could have restless legs syndrome. I think it may be worthwhile to try her on Pramipexole to see if this helps her pain. Will start Pramipexole 0.125 mg daily (2-3 hours before bedtime) and advised to increase to 0.250 mg in 4 days if pain not better. If Pramipexole does not help then would consider giving a low dose muscle relaxer like Flexeril since she describes it as a tightness. She has follow up in a few weeks and can reassess symptoms at that point.

## 2016-01-08 NOTE — Progress Notes (Signed)
   CC: Leg pain  HPI:  [name] is a 75 y.o. woman with PMHx as noted below who presents today for follow up of her leg pain.   She was seen in clinic on 11/20 and reported the pain was in her muscles in her legs. Blood work revealed a low vitamin D level and minimally elevated CRP of 8.4. ESR normal and CK normal. Her Hgb was found to be 6.1 and she required transfusion with 2 units PRBCs. She has a hx of gastric AVMs and hereditary hemorrhagic telangiectasia requiring multiple blood and iron transfusions.   Today, she reports the pain has been ongoing for about 2 months and worsening. She has a hard time describing the pain but describes it best as "tightness" and a "cramping." She reports the only thing that has helped the pain is rubbing her legs. She states the pain is bothersome and prevents her from sleeping. She notes walking makes the pain worse. She has tried voltaren gel twice daily with only minimal relief.   Past Medical History:  Diagnosis Date  . CHF (congestive heart failure) ([place])   . Chronic anemia   . Chronic diastolic CHF (congestive heart failure) ([place]) 10/03/2013  . Chronic GI bleeding    [name] 11/29/2014  . Family history of anesthesia complication    "niece has a hard time coming out" (09/15/2012)  . Frequent nosebleeds    chronic  . Gastric AV malformation    [name] 11/29/2014  . GERD (gastroesophageal reflux disease)   . Heart murmur   . History of blood transfusion "several"  . History of epistaxis   . HTN (hypertension), benign 03/02/2012  . Hyperlipidemia   . Iron deficiency anemia    chronic infusions"  . Lichen planus    Both lower extremities  . 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 Pneumonia 1990's X 2  . Seizures ([place]) 09/2014  . Telangiectasia    Gastric   . 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    Review of Systems:  All negative except per HPI  Physical Exam:  Vitals:   01/08/16 1334  BP:  (!) 123/31  Pulse: 67  Temp: 97.9 F (36.6 C)  TempSrc: Oral  SpO2: 100%  Weight: 146 lb 1.6 oz (66.3 kg)  Height: _0  (1.6 m)   General: elderly woman sitting up, NAD HEENT: EOMI, sclera anicteric, mucus membranes moist CV: RRR, 3/6 systolic murmur heard best at RUSB Pulm: CTA bilaterally, breaths non-labored Ext: No swelling or erythema of ankles, knees, hands, wrists, or elbows. She has some tenderness with flexion/extension of ankles and left knee. Mild diffuse tenderness to palpation of lower legs. Neuro: alert and oriented x 3 Skin: Multiple circular flat-topped lesions on bilateral lower legs- chronic  Assessment & Plan:   See Encounters Tab for problem based charting.  Patient discussed with Dr. Angelia Mould

## 2016-01-08 NOTE — Patient Instructions (Signed)
General Instructions: - Start Pramipexole 0.125 mg daily. This can be increased to 0.250 in 4 days if the 0.125 mg dose isn't helping.  - Take 2-3 hours before bedtime  Please bring your medicines with you each time you come to clinic.  Medicines may include prescription medications, over-the-counter medications, herbal remedies, eye drops, vitamins, or other pills.   Progress Toward Treatment Goals:  No flowsheet data found.  Self Care Goals & Plans:  Self Care Goal 08/22/2015  Manage my medications take my medicines as prescribed; bring my medications to every visit; refill my medications on time  Monitor my health keep track of my blood glucose; bring my glucose meter and log to each visit  Eat healthy foods drink diet soda or water instead of juice or soda; eat more vegetables; eat foods that are low in salt; eat baked foods instead of fried foods; eat fruit for snacks and desserts  Be physically active find an activity I enjoy  Prevent falls -    No flowsheet data found.   Care Management & Community Referrals:  No flowsheet data found.

## 2016-01-10 ENCOUNTER — Other Ambulatory Visit: Payer: Self-pay | Admitting: Oncology

## 2016-01-10 DIAGNOSIS — I780 Hereditary hemorrhagic telangiectasia: Secondary | ICD-10-CM

## 2016-01-10 DIAGNOSIS — N183 Chronic kidney disease, stage 3 unspecified: Secondary | ICD-10-CM

## 2016-01-10 DIAGNOSIS — I10 Essential (primary) hypertension: Secondary | ICD-10-CM

## 2016-01-10 DIAGNOSIS — K31819 Angiodysplasia of stomach and duodenum without bleeding: Secondary | ICD-10-CM

## 2016-01-14 ENCOUNTER — Telehealth: Payer: Self-pay | Admitting: *Deleted

## 2016-01-14 ENCOUNTER — Other Ambulatory Visit: Payer: Self-pay | Admitting: Oncology

## 2016-01-14 ENCOUNTER — Other Ambulatory Visit (INDEPENDENT_AMBULATORY_CARE_PROVIDER_SITE_OTHER): Payer: Medicare Other

## 2016-01-14 DIAGNOSIS — K31819 Angiodysplasia of stomach and duodenum without bleeding: Secondary | ICD-10-CM

## 2016-01-14 DIAGNOSIS — D500 Iron deficiency anemia secondary to blood loss (chronic): Secondary | ICD-10-CM

## 2016-01-14 DIAGNOSIS — I10 Essential (primary) hypertension: Secondary | ICD-10-CM

## 2016-01-14 DIAGNOSIS — N183 Chronic kidney disease, stage 3 unspecified: Secondary | ICD-10-CM

## 2016-01-14 DIAGNOSIS — I780 Hereditary hemorrhagic telangiectasia: Secondary | ICD-10-CM

## 2016-01-14 DIAGNOSIS — K31811 Angiodysplasia of stomach and duodenum with bleeding: Secondary | ICD-10-CM

## 2016-01-14 DIAGNOSIS — D509 Iron deficiency anemia, unspecified: Secondary | ICD-10-CM

## 2016-01-14 DIAGNOSIS — D649 Anemia, unspecified: Secondary | ICD-10-CM

## 2016-01-14 DIAGNOSIS — D62 Acute posthemorrhagic anemia: Secondary | ICD-10-CM

## 2016-01-14 LAB — FERRITIN: Ferritin: 9 ng/mL — ABNORMAL LOW (ref 11–307)

## 2016-01-14 LAB — COMPREHENSIVE METABOLIC PANEL
ALBUMIN: 3.3 g/dL — AB (ref 3.5–5.0)
ALK PHOS: 71 U/L (ref 38–126)
ALT: 15 U/L (ref 14–54)
ANION GAP: 6 (ref 5–15)
AST: 21 U/L (ref 15–41)
BILIRUBIN TOTAL: 1 mg/dL (ref 0.3–1.2)
BUN: 13 mg/dL (ref 6–20)
CALCIUM: 9.5 mg/dL (ref 8.9–10.3)
CO2: 27 mmol/L (ref 22–32)
CREATININE: 1.32 mg/dL — AB (ref 0.44–1.00)
Chloride: 109 mmol/L (ref 101–111)
GFR calc Af Amer: 45 mL/min — ABNORMAL LOW (ref >60)
GFR calc non Af Amer: 38 mL/min — ABNORMAL LOW (ref >60)
GLUCOSE: 126 mg/dL — AB (ref 65–99)
Potassium: 4.2 mmol/L (ref 3.5–5.1)
Sodium: 142 mmol/L (ref 135–145)
TOTAL PROTEIN: 6 g/dL — AB (ref 6.5–8.1)

## 2016-01-14 LAB — CBC WITH DIFFERENTIAL/PLATELET
Basophils Absolute: 0 10*3/uL (ref 0.0–0.1)
Basophils Relative: 1 %
EOS PCT: 5 %
Eosinophils Absolute: 0.3 10*3/uL (ref 0.0–0.7)
HEMATOCRIT: 22.4 % — AB (ref 36.0–46.0)
Hemoglobin: 6.8 g/dL — CL (ref 12.0–15.0)
LYMPHS ABS: 0.5 10*3/uL — AB (ref 0.7–4.0)
LYMPHS PCT: 10 %
MCH: 23.9 pg — AB (ref 26.0–34.0)
MCHC: 30.4 g/dL (ref 30.0–36.0)
MCV: 78.9 fL (ref 78.0–100.0)
MONO ABS: 0.4 10*3/uL (ref 0.1–1.0)
MONOS PCT: 7 %
Neutro Abs: 3.7 10*3/uL (ref 1.7–7.7)
Neutrophils Relative %: 77 %
PLATELETS: 277 10*3/uL (ref 150–400)
RBC: 2.84 MIL/uL — ABNORMAL LOW (ref 3.87–5.11)
RDW: 18.2 % — AB (ref 11.5–15.5)
WBC: 4.8 10*3/uL (ref 4.0–10.5)

## 2016-01-14 LAB — SAMPLE TO BLOOD BANK

## 2016-01-14 NOTE — Telephone Encounter (Signed)
Call from [place], from the lab - states pt's Hgb is 6.8. Dr [name] informed.

## 2016-01-14 NOTE — Progress Notes (Signed)
Internal Medicine Clinic Attending  Case discussed with Dr. Rivet soon after the resident saw the patient.  We reviewed the resident's history and exam and pertinent patient test results.  I agree with the assessment, diagnosis, and plan of care documented in the resident's note.  

## 2016-01-14 NOTE — Telephone Encounter (Signed)
Called pt - appt for blood transfusion scheduled for tomorrow am @ 0800 AM [facility] Short Stay. Talked to pt's husband - informed Hgb 6.8 and of appt - stated ok, voiced understanding.

## 2016-01-15 ENCOUNTER — Ambulatory Visit (HOSPITAL_COMMUNITY)
Admission: RE | Admit: 2016-01-15 | Discharge: 2016-01-15 | Disposition: A | Discharge status: Home / Self Care | Payer: Medicare Other | Source: Ambulatory Visit | Attending: Oncology | Admitting: Oncology

## 2016-01-15 DIAGNOSIS — D500 Iron deficiency anemia secondary to blood loss (chronic): Secondary | ICD-10-CM | POA: Diagnosis not present

## 2016-01-15 DIAGNOSIS — K31811 Angiodysplasia of stomach and duodenum with bleeding: Secondary | ICD-10-CM | POA: Diagnosis not present

## 2016-01-15 DIAGNOSIS — I780 Hereditary hemorrhagic telangiectasia: Secondary | ICD-10-CM | POA: Insufficient documentation

## 2016-01-15 LAB — PREPARE RBC (CROSSMATCH)

## 2016-01-15 MED ORDER — SODIUM CHLORIDE 0.9 % IV SOLN: Freq: Once | INTRAVENOUS | Status: DC

## 2016-01-15 MED ORDER — HEPARIN SOD (PORK) LOCK FLUSH 100 UNIT/ML IV SOLN
500.0000 [IU] | Freq: Once | INTRAVENOUS | Status: AC
  Administered 2016-01-15: 500 [IU] via INTRAVENOUS

## 2016-01-15 MED ORDER — HEPARIN SOD (PORK) LOCK FLUSH 100 UNIT/ML IV SOLN
INTRAVENOUS | Status: AC
  Administered 2016-01-15: 500 [IU] via INTRAVENOUS
  Filled 2016-01-15: qty 5

## 2016-01-16 ENCOUNTER — Other Ambulatory Visit: Payer: Medicare Other

## 2016-01-16 LAB — TYPE AND SCREEN
ABO/RH(D): A POS
Antibody Screen: NEGATIVE
UNIT DIVISION: 0
Unit division: 0

## 2016-01-23 ENCOUNTER — Encounter: Payer: Self-pay | Admitting: Internal Medicine

## 2016-01-23 ENCOUNTER — Ambulatory Visit (INDEPENDENT_AMBULATORY_CARE_PROVIDER_SITE_OTHER): Payer: Medicare Other | Admitting: Internal Medicine

## 2016-01-23 VITALS — BP 135/41 | HR 61 | Temp 98.3°F | Ht 63.0 in | Wt 143.0 lb

## 2016-01-23 DIAGNOSIS — E1122 Type 2 diabetes mellitus with diabetic chronic kidney disease: Secondary | ICD-10-CM | POA: Diagnosis not present

## 2016-01-23 DIAGNOSIS — N183 Chronic kidney disease, stage 3 unspecified: Secondary | ICD-10-CM

## 2016-01-23 DIAGNOSIS — E1165 Type 2 diabetes mellitus with hyperglycemia: Secondary | ICD-10-CM

## 2016-01-23 DIAGNOSIS — Z794 Long term (current) use of insulin: Secondary | ICD-10-CM

## 2016-01-23 DIAGNOSIS — M79605 Pain in left leg: Secondary | ICD-10-CM

## 2016-01-23 DIAGNOSIS — M79604 Pain in right leg: Secondary | ICD-10-CM

## 2016-01-23 DIAGNOSIS — E1129 Type 2 diabetes mellitus with other diabetic kidney complication: Secondary | ICD-10-CM

## 2016-01-23 DIAGNOSIS — E2839 Other primary ovarian failure: Secondary | ICD-10-CM

## 2016-01-23 DIAGNOSIS — Z0000 Encounter for general adult medical examination without abnormal findings: Secondary | ICD-10-CM

## 2016-01-23 DIAGNOSIS — Z79899 Other long term (current) drug therapy: Secondary | ICD-10-CM

## 2016-01-23 DIAGNOSIS — IMO0002 Reserved for concepts with insufficient information to code with codable children: Secondary | ICD-10-CM

## 2016-01-23 NOTE — Patient Instructions (Signed)
Thank you for coming to see me today. It was a pleasure. Today we talked about:   Your leg pain: I am glad the new medication is helping you.  Keep taking it as prescribed and let us know of any new issues.  I have placed an order for you to have a rolling walker at home.  In the future, we can discuss home physical therapy if needed.  Please call Dr. Webb Laws to schedule your diabetic eye exam.  Please follow-up with [place] in 3-6 months.  If you have any questions or concerns, please do not hesitate to call the office at [phone].  Take Care,   [name], DO

## 2016-01-23 NOTE — Progress Notes (Signed)
CC: here for f/u of leg pain  HPI:  [name] is a 75 y.o. woman with a past medical history listed below here today for follow up of her leg pain.  For details of today's visit and the status of her chronic medical issues please refer to the assessment and plan.   Past Medical History:  Diagnosis Date  . CHF (congestive heart failure) ([employer])   . Chronic anemia   . Chronic diastolic CHF (congestive heart failure) ([place]) 10/03/2013  . Chronic GI bleeding    [name] 11/29/2014  . Family history of anesthesia complication    "niece has a hard time coming out" (09/15/2012)  . Frequent nosebleeds    chronic  . Gastric AV malformation    [name] 11/29/2014  . GERD (gastroesophageal reflux disease)   . Heart murmur   . History of blood transfusion "several"  . History of epistaxis   . HTN (hypertension), benign 03/02/2012  . Hyperlipidemia   . Iron deficiency anemia    chronic infusions"  . Lichen planus    Both lower extremities  . 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 Pneumonia 1990's X 2  . Seizures ([place]) 09/2014  . Telangiectasia    Gastric   . 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    Review of Systems:  Please see pertinent ROS reviewed in HPI and problem based charting.   Physical Exam:  Vitals:   01/23/16 1426  BP: (!) 135/41  Pulse: 61  Temp: 98.3 F (36.8 C)  TempSrc: Oral  SpO2: 100%  Weight: 143 lb (64.9 kg)  Height: 5\' 3"  (1.6 m)   Physical Exam  Constitutional: She is oriented to person, place, and time.  Pleasant, elderly, AA woman, sitting in wheelchair, NAD  HENT:  Head: Normocephalic and atraumatic.  Scattered, small hemangiomas on roof of mouth.  Cardiovascular: Normal rate and regular rhythm.   Murmur (3/6 systolic murmur) heard. Pulmonary/Chest: Effort normal.  Musculoskeletal: She exhibits no tenderness.  Freely able to move both lower extremities without difficulty  Neurological: She is alert and  oriented to person, place, and time.  Skin:  Multiple, small pinpoint hemangiomas on palms bilaterally.  Psychiatric: Mood and affect normal.     Assessment & Plan:   See Encounters Tab for problem based charting.  Patient seen with Dr. [name].   DM (diabetes mellitus), type 2, uncontrolled, with renal complications ([place]) Lab Results  Component Value Date   HGBA1C 7.1 06/20/2015    A: Current regimen is glipizide 2.5mg  daily and Levemir 20units BID.  Seen in clinic 11/20 and given new glucometer.  Did not bring today but reports no new issues.  Last A1c was 7.1 but likely unreliable in setting of frequent blood transfusions so we rely on daily glucose readings to attempt to better control her blood sugars.  She is due for her eye exam.  P: - asked her to contact her eye doctor Webb Laws) to set up her eye exam - reminded her to bring glucometer to her next visit  Chronic kidney disease, stage III (moderate) Stable from recent labs on 12/5  Bilateral leg pain A: Patient reports marked improvement in her bilateral leg pains since starting Pramipexole about 2 weeks ago.  She only has mild discomfort and occasional leg fatigue when walking or standing for prolonged periods of time.  She is requesting a Rolator walker with seat so that she is able to rest  when walking. SHe has not experienced any falls.  P: - continue Pramipexole - ordered Rolator walker - advised we could consider HH PT if she thought would be beneficial and she declines at this time  Healthcare maintenance A/P: DEXA scan was ordered back in May but has not been done.  Will attempt to have this completed.

## 2016-01-24 NOTE — Assessment & Plan Note (Signed)
A/P: DEXA scan was ordered back in May but has not been done.  Will attempt to have this completed.

## 2016-01-24 NOTE — Assessment & Plan Note (Signed)
Stable from recent labs on 12/5

## 2016-01-24 NOTE — Assessment & Plan Note (Signed)
Lab Results  Component Value Date   HGBA1C 7.1 06/20/2015    A: Current regimen is glipizide 2.5mg  daily and Levemir 20units BID.  Seen in clinic 11/20 and given new glucometer.  Did not bring today but reports no new issues.  Last A1c was 7.1 but likely unreliable in setting of frequent blood transfusions so we rely on daily glucose readings to attempt to better control her blood sugars.  She is due for her eye exam.  P: - asked her to contact her eye doctor Webb Laws) to set up her eye exam - reminded her to bring glucometer to her next visit

## 2016-01-24 NOTE — Progress Notes (Signed)
Medicine attending: I personally interviewed and briefly examined this patient on the day of the patient visit and reviewed pertinent clinical ,laboratory, and radiographic data  with resident physician Dr. [name] and we discussed a management plan.

## 2016-01-24 NOTE — Assessment & Plan Note (Signed)
A: Patient reports marked improvement in her bilateral leg pains since starting Pramipexole about 2 weeks ago.  She only has mild discomfort and occasional leg fatigue when walking or standing for prolonged periods of time.  She is requesting a Rolator walker with seat so that she is able to rest when walking. SHe has not experienced any falls.  P: - continue Pramipexole - ordered Rolator walker - advised we could consider HH PT if she thought would be beneficial and she declines at this time

## 2016-01-27 ENCOUNTER — Ambulatory Visit (INDEPENDENT_AMBULATORY_CARE_PROVIDER_SITE_OTHER): Payer: Medicare Other | Admitting: Oncology

## 2016-01-27 ENCOUNTER — Encounter: Payer: Self-pay | Admitting: Oncology

## 2016-01-27 ENCOUNTER — Other Ambulatory Visit: Payer: Self-pay | Admitting: Internal Medicine

## 2016-01-27 VITALS — BP 129/41 | HR 60 | Temp 98.1°F | Ht 61.0 in | Wt 142.4 lb

## 2016-01-27 DIAGNOSIS — Q2733 Arteriovenous malformation of digestive system vessel: Secondary | ICD-10-CM

## 2016-01-27 DIAGNOSIS — Z886 Allergy status to analgesic agent status: Secondary | ICD-10-CM

## 2016-01-27 DIAGNOSIS — K31819 Angiodysplasia of stomach and duodenum without bleeding: Secondary | ICD-10-CM

## 2016-01-27 DIAGNOSIS — I5032 Chronic diastolic (congestive) heart failure: Secondary | ICD-10-CM

## 2016-01-27 DIAGNOSIS — I780 Hereditary hemorrhagic telangiectasia: Secondary | ICD-10-CM

## 2016-01-27 DIAGNOSIS — R413 Other amnesia: Secondary | ICD-10-CM

## 2016-01-27 DIAGNOSIS — Z794 Long term (current) use of insulin: Secondary | ICD-10-CM

## 2016-01-27 DIAGNOSIS — I110 Hypertensive heart disease with heart failure: Secondary | ICD-10-CM

## 2016-01-27 DIAGNOSIS — E119 Type 2 diabetes mellitus without complications: Secondary | ICD-10-CM

## 2016-01-27 LAB — CBC WITH DIFFERENTIAL/PLATELET
BASOS ABS: 0 10*3/uL (ref 0.0–0.1)
BASOS PCT: 0 %
Eosinophils Absolute: 0.3 10*3/uL (ref 0.0–0.7)
Eosinophils Relative: 6 %
HEMATOCRIT: 32.2 % — AB (ref 36.0–46.0)
Hemoglobin: 10.2 g/dL — ABNORMAL LOW (ref 12.0–15.0)
Lymphocytes Relative: 11 %
Lymphs Abs: 0.6 10*3/uL — ABNORMAL LOW (ref 0.7–4.0)
MCH: 24.8 pg — ABNORMAL LOW (ref 26.0–34.0)
MCHC: 31.7 g/dL (ref 30.0–36.0)
MCV: 78.2 fL (ref 78.0–100.0)
MONO ABS: 0.3 10*3/uL (ref 0.1–1.0)
Monocytes Relative: 7 %
NEUTROS ABS: 3.8 10*3/uL (ref 1.7–7.7)
NEUTROS PCT: 76 %
Platelets: 190 10*3/uL (ref 150–400)
RBC: 4.12 MIL/uL (ref 3.87–5.11)
RDW: 17.2 % — AB (ref 11.5–15.5)
WBC: 5 10*3/uL (ref 4.0–10.5)

## 2016-01-27 LAB — FERRITIN: FERRITIN: 12 ng/mL (ref 11–307)

## 2016-01-27 NOTE — Patient Instructions (Addendum)
To lab today Blood count every 2 weeks - change to Mondays starting 02/17/16 MD visit 4 months

## 2016-01-27 NOTE — Progress Notes (Signed)
Hematology and Oncology Follow Up Visit  [name] [id number] 04-09-1940 75 y.o. 01/27/2016 1:24 PM   Principle Diagnosis: Encounter Diagnoses  Name Primary?  . HHT (hereditary hemorrhagic telangiectasia) ([place]) Yes  . 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 She also has intermittent epistaxis. She requires frequent transfusions and parenteral iron supplementation. She does not tolerate oral iron. Transfusion requirements are becoming more frequent.  She last required hospital admission  for urgent blood transfusion on 11/29/14. She underwent EGD and a laser ablation procedure of a AVM in the stomach at that time. She had a previous evaluation by gastroenterology and underwent upper endoscopy on October 04, 2013 by Dr. Laurence Spates. A number of her gastric AVMs were actively bleeding. Some were cauterized. The largest was clipped.  Despite laser fulguration and clipping of gastric AVMs, she continues to lose blood on a continuous basis.  She continues to be transfusion dependent. I alternate parenteral iron with blood to try to keep her stable. It is an ongoing challenge.She had an episode of hematemesis and was admitted on Jul 05, 2014. A number of AVMs and previous scars from laser procedures were seen but no active bleeding at time of upper endoscopy on May 27. She had a another procedure on September 05, 2014. A large AVM along the greater curvature of the stomach was bleeding and required laser therapy.  Interim History:  She continues to require blood on a frequent basis. We alternate between [facility] short stay, the was a lung cancer center, and the sickle cell center depending on bed availability. At times her hemoglobin falls suddenly. Overall she tolerates a low hemoglobin well given her chronic anemia. She  continues to have low-grade intermittent epistaxis. She has not had any hematochezia. Recently stools have not been black. She denied any active cardiorespiratory symptoms and is not having chest pain, dyspnea, palpitations, or leg swelling. She denied any hematuria or vaginal bleeding.  Recent follow-up visit with her primary care physician on December 14 and I happened to be Attending in the clinic that day. Blood pressure well controlled on current medications. Diabetes also under reasonable control. Patient noted improvement in chronic bilateral leg pains since starting pramipexole prescribed in November 2017. DEXA scan ordered to evaluate for osteoporosis.  Her daughter accompanies her today.  Medications: reviewed  Allergies:  Allergies  Allergen Reactions  . Aspirin Nausea And Vomiting    Review of Systems: See interim history Remaining ROS negative:   Physical Exam: Blood pressure (!) 129/41, pulse 60, temperature 98.1 F (36.7 C), temperature source Oral, height 5\' 1"  (1.549 m), weight 142 lb 6.4 oz (64.6 kg), SpO2 98 %. Wt Readings from Last 3 Encounters:  01/27/16 142 lb 6.4 oz (64.6 kg)  01/23/16 143 lb (64.9 kg)  01/15/16 144 lb (65.3 kg)     General appearance: Well-nourished African-American woman HENNT: Pharynx no erythema, exudate, mass, or ulcer. No thyromegaly or thyroid nodules Lymph nodes: No cervical, supraclavicular, or axillary lymphadenopathy Breasts:  Lungs: Clear to auscultation, resonant to percussion throughout Heart: Regular rhythm, 3/6 aortic stenosis murmur, no gallop, no rub, no click, no edema Abdomen: Soft, nontender, normal bowel sounds, no mass, no organomegaly Extremities: No edema, no calf tenderness Musculoskeletal: no joint deformities GU:  Vascular: Carotid pulses 2+, no bruits,  Neurologic: Alert, oriented, PERRLA, , cranial nerves grossly normal, motor  strength 5 over 5, reflexes 1+ symmetric, upper body coordination normal, gait  normal, Skin: Numerous hemangiomas roof of mouth, finger tips chronic lichen planus dermatitis extensive over both lower extremities rash, no ecchymosis  Lab Results: CBC W/Diff    Component Value Date/Time   WBC 4.8 01/14/2016 1025   RBC 2.84 (L) 01/14/2016 1025   HGB 6.8 (LL) 01/14/2016 1025   HGB 9.7 (L) 03/13/2015 1059   HCT 22.4 (L) 01/14/2016 1025   HCT 29.5 (L) 03/13/2015 1059   PLT 277 01/14/2016 1025   PLT 140 (L) 03/13/2015 1059   PLT 237 11/09/2014 1547   MCV 78.9 01/14/2016 1025   MCV 85.5 03/13/2015 1059   MCH 23.9 (L) 01/14/2016 1025   MCHC 30.4 01/14/2016 1025   RDW 18.2 (H) 01/14/2016 1025   RDW 21.7 (H) 03/13/2015 1059   LYMPHSABS 0.5 (L) 01/14/2016 1025   LYMPHSABS 0.4 (L) 03/13/2015 1059   MONOABS 0.4 01/14/2016 1025   MONOABS 0.3 03/13/2015 1059   EOSABS 0.3 01/14/2016 1025   EOSABS 0.3 03/13/2015 1059   BASOSABS 0.0 01/14/2016 1025   BASOSABS 0.0 03/13/2015 1059     Chemistry      Component Value Date/Time   NA 142 01/14/2016 1025   NA 144 04/04/2014 1019   K 4.2 01/14/2016 1025   K 4.5 04/04/2014 1019   CL 109 01/14/2016 1025   CL 111 (H) 12/28/2011 0825   CO2 27 01/14/2016 1025   CO2 26 04/04/2014 1019   BUN 13 01/14/2016 1025   BUN 16.9 04/04/2014 1019   CREATININE 1.32 (H) 01/14/2016 1025   CREATININE 0.92 01/28/2015 0811   CREATININE 0.9 04/04/2014 1019      Component Value Date/Time   CALCIUM 9.5 01/14/2016 1025   CALCIUM 9.3 04/04/2014 1019   ALKPHOS 71 01/14/2016 1025   ALKPHOS 140 04/04/2014 1019   AST 21 01/14/2016 1025   AST 30 04/04/2014 1019   ALT 15 01/14/2016 1025   ALT 25 04/04/2014 1019   BILITOT 1.0 01/14/2016 1025   BILITOT 0.43 04/04/2014 1019       Radiological Studies: No results found.  Impression:  #1. HHT #2. Chronic GI blood loss secondary to #1 Continue to monitor on an every two-week basis. I'm trying to change her lab days to Mondays to give more lead time in case she needs a transfusion during any  given week.  We [facility] an [facility] whenever possible. As noted above, alternative transfusion sites of the [facility] and the sickle cell center.  #3. Extensive lichen planus rash lower extremities #4. Aortic stenosis #5. Grade 2 chronic diastolic heart failure with severe asymmetric septal hypertrophy most recent echocardiogram 07/06/2014 #6. Essential hypertension. #7. Type 2 diabetes now insulin-dependent using Levemir #8. Faint carotid bruit left side. #9. Question seizure disorder recently started on Keppra September 2016 #10. Memory loss. Recently started on Aricept by neurology December 2016  CC: Patient Care Team: [name], DO as PCP - General (Internal Medicine)   [name], MD 12/18/20171:24 PM

## 2016-01-28 ENCOUNTER — Other Ambulatory Visit: Payer: Medicare Other

## 2016-01-28 ENCOUNTER — Telehealth: Payer: Self-pay | Admitting: *Deleted

## 2016-01-28 LAB — SAMPLE TO BLOOD BANK

## 2016-01-28 NOTE — Telephone Encounter (Signed)
-----   Message from [name], MD sent at 01/27/2016  5:48 PM EST ----- Call pt: Hb great at 10.2

## 2016-01-28 NOTE — Telephone Encounter (Signed)
Pt called - talked her husband, informed "Hb great at 10.2" per Dr [name].Stated he will let [name] of the great news.

## 2016-01-30 ENCOUNTER — Other Ambulatory Visit: Payer: Medicare Other

## 2016-02-04 ENCOUNTER — Other Ambulatory Visit: Payer: Self-pay | Admitting: Cardiology

## 2016-02-04 ENCOUNTER — Telehealth: Payer: Self-pay

## 2016-02-04 DIAGNOSIS — I10 Essential (primary) hypertension: Secondary | ICD-10-CM

## 2016-02-04 NOTE — Telephone Encounter (Signed)
I called patient about her lasix refill request we recieved and she stated she does not need a refill and Dr Radford Pax does not refill lasix for her. I explained I didnt see an OV with Dr Radford Pax and patient refused to schedule one.

## 2016-02-05 ENCOUNTER — Other Ambulatory Visit: Payer: Self-pay | Admitting: Internal Medicine

## 2016-02-05 ENCOUNTER — Other Ambulatory Visit: Payer: Self-pay | Admitting: Neurology

## 2016-02-05 ENCOUNTER — Other Ambulatory Visit: Payer: Self-pay | Admitting: Cardiology

## 2016-02-05 DIAGNOSIS — I10 Essential (primary) hypertension: Secondary | ICD-10-CM

## 2016-02-05 NOTE — Telephone Encounter (Signed)
Telephone   02/04/2016 [name], [place]  [facility]iology   Conversation  (Newest Message First)  Edmondson, Marty      02/04/16 3:20 PM  Note    I called patient about her lasix refill request we recieved and she stated she does not need a refill and Dr Radford Pax does not refill lasix for her. I explained I didnt see an OV with Dr Radford Pax and patient refused to schedule one.    [name], CMA  to [employer]. [place]       02/04/16 3:19 PM  I called patient about her lasix refill request we recieved and she stated she does not need a refill and Dr Radford Pax does not refill lasix for her. I explained I didnt see an OV with Dr Radford Pax and patient refused to schedule one.

## 2016-02-06 ENCOUNTER — Other Ambulatory Visit: Payer: Self-pay | Admitting: Internal Medicine

## 2016-02-06 MED ORDER — GLUCOSE BLOOD VI STRP: ORAL_STRIP | 12 refills | Status: DC

## 2016-02-11 ENCOUNTER — Other Ambulatory Visit: Payer: Self-pay | Admitting: Cardiology

## 2016-02-11 ENCOUNTER — Other Ambulatory Visit: Payer: Self-pay | Admitting: Neurology

## 2016-02-11 DIAGNOSIS — I10 Essential (primary) hypertension: Secondary | ICD-10-CM

## 2016-02-13 ENCOUNTER — Other Ambulatory Visit: Payer: Self-pay | Admitting: Neurology

## 2016-02-17 ENCOUNTER — Ambulatory Visit (INDEPENDENT_AMBULATORY_CARE_PROVIDER_SITE_OTHER): Payer: Medicare Other | Admitting: Internal Medicine

## 2016-02-17 ENCOUNTER — Other Ambulatory Visit: Payer: Self-pay | Admitting: Oncology

## 2016-02-17 ENCOUNTER — Telehealth: Payer: Self-pay | Admitting: *Deleted

## 2016-02-17 ENCOUNTER — Other Ambulatory Visit (INDEPENDENT_AMBULATORY_CARE_PROVIDER_SITE_OTHER): Payer: Medicare Other

## 2016-02-17 VITALS — BP 121/32 | HR 64 | Temp 98.1°F | Ht 61.0 in | Wt 145.7 lb

## 2016-02-17 DIAGNOSIS — L853 Xerosis cutis: Secondary | ICD-10-CM | POA: Insufficient documentation

## 2016-02-17 DIAGNOSIS — I780 Hereditary hemorrhagic telangiectasia: Secondary | ICD-10-CM

## 2016-02-17 DIAGNOSIS — K31819 Angiodysplasia of stomach and duodenum without bleeding: Secondary | ICD-10-CM

## 2016-02-17 DIAGNOSIS — K31811 Angiodysplasia of stomach and duodenum with bleeding: Secondary | ICD-10-CM

## 2016-02-17 DIAGNOSIS — R234 Changes in skin texture: Secondary | ICD-10-CM | POA: Diagnosis not present

## 2016-02-17 LAB — CBC WITH DIFFERENTIAL/PLATELET
BASOS ABS: 0.1 10*3/uL (ref 0.0–0.1)
Basophils Relative: 1 %
EOS ABS: 0.7 10*3/uL (ref 0.0–0.7)
Eosinophils Relative: 13 %
HEMATOCRIT: 20.8 % — AB (ref 36.0–46.0)
HEMOGLOBIN: 6.3 g/dL — AB (ref 12.0–15.0)
Lymphocytes Relative: 13 %
Lymphs Abs: 0.7 10*3/uL (ref 0.7–4.0)
MCH: 22.8 pg — ABNORMAL LOW (ref 26.0–34.0)
MCHC: 30.3 g/dL (ref 30.0–36.0)
MCV: 75.4 fL — ABNORMAL LOW (ref 78.0–100.0)
MONOS PCT: 8 %
Monocytes Absolute: 0.4 10*3/uL (ref 0.1–1.0)
Neutro Abs: 3.1 10*3/uL (ref 1.7–7.7)
Neutrophils Relative %: 65 %
Platelets: 169 10*3/uL (ref 150–400)
RBC: 2.76 MIL/uL — AB (ref 3.87–5.11)
RDW: 18.2 % — AB (ref 11.5–15.5)
WBC: 5 10*3/uL (ref 4.0–10.5)

## 2016-02-17 LAB — SAMPLE TO BLOOD BANK

## 2016-02-17 MED ORDER — CETAPHIL MOISTURIZING EX LOTN: 1.0000 "application " | TOPICAL_LOTION | CUTANEOUS | 0 refills | Status: DC | PRN

## 2016-02-17 NOTE — Progress Notes (Signed)
Appt for blood transfusion scheduled at the [facility]( only site with available appts this week)  for tomorrow @ 0900 AM.

## 2016-02-17 NOTE — Progress Notes (Signed)
CC: Skin rash and anemia  HPI:  [name] is a 76 y.o. woman with a history of transfusion dependent hereditary hemorrhagic telangiectasia, diastolic CHF, hypertension, and type 2 diabetes who is in clinic today for a 2 weeks of upper arm and back pruritus. The onset was gradual and she has not tried anything to improve the symptoms. There is no associated skin rash. She does have some excoriations due to scratching frequently during this entire time. She denies any fevers or respiratory or GI symptoms. She uses a [employer] when washing and this is not changed for years. She also denies any change in laundry detergents or fabric. She reports having skin problems in the past including seborrheic dermatitis and lichen planus of the lower extremities as well as her chronic presence of telangiectasias that says this itching is different and much less focal than these past problems.  Also at her evaluation today we checked a hemoglobin which was low at 6.3 as a routine check for her transfusion dependent HHT. She denies any bright red blood in bowel movements recently but does chronically have some dark-colored stools attributable to gastric AVM. She denies any feeling of fatigue or shortness of breath with activity.  See problem based assessment and plan below for additional details  Past Medical History:  Diagnosis Date  . CHF (congestive heart failure) ([place])   . Chronic anemia   . Chronic diastolic CHF (congestive heart failure) ([place]) 10/03/2013  . Chronic GI bleeding    [name] 11/29/2014  . Family history of anesthesia complication    "niece has a hard time coming out" (09/15/2012)  . Frequent nosebleeds    chronic  . Gastric AV malformation    [name] 11/29/2014  . GERD (gastroesophageal reflux disease)   . Heart murmur   . History of blood transfusion "several"  . History of epistaxis   . HTN (hypertension), benign 03/02/2012  . Hyperlipidemia   . Iron deficiency anemia    chronic infusions"  . Lichen planus    Both lower extremities  . 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 Pneumonia 1990's X 2  . Seizures ([place]) 09/2014  . Telangiectasia    Gastric   . 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    Review of Systems:  Review of Systems  Constitutional: Negative for fever and malaise/fatigue.  HENT: Negative for tinnitus.   Eyes: Negative for blurred vision.  Respiratory: Negative for shortness of breath.   Cardiovascular: Negative for chest pain and leg swelling.  Gastrointestinal: Negative for abdominal pain and diarrhea.  Genitourinary: Negative for hematuria.  Skin: Positive for itching and rash.  Neurological: Negative for dizziness and weakness.    Physical Exam: Physical Exam  Constitutional: She is oriented to person, place, and time and well-developed, well-nourished, and in no distress. No distress.  HENT:  Head: Normocephalic.  Mouth/Throat: Oropharynx is clear and moist.  Eyes: Conjunctivae are normal.  Cardiovascular: Normal rate and regular rhythm.   Pulmonary/Chest: Effort normal and breath sounds normal.  Abdominal: Soft. There is no tenderness.  Musculoskeletal: Normal range of motion. She exhibits no edema.  Neurological: She is alert and oriented to person, place, and time.  Skin:  1-2 mm telangiectasias present particularly across hands and mouth. Skin appears dry and some flaking without discoloration noted along upper arms bilaterally and on her back. There are some excoriations on the left upper arm. Elbows and forearms are spared.  Psychiatric:  Mood and affect normal.    Vitals:   02/17/16 1446  BP: (!) 121/32  Pulse: 64  Temp: 98.1 F (36.7 C)  TempSrc: Oral  SpO2: 100%  Weight: 145 lb 11.2 oz (66.1 kg)  Height: 5\' 1"  (1.549 m)    Assessment & Plan:   See Encounters Tab for problem based charting.  Patient discussed with Dr. Angelia Mould

## 2016-02-17 NOTE — Telephone Encounter (Signed)
WALK-IN Pt states she has a rash on her back for 2 weeks, itches so bad she makes it bleed scratching Also has petechiae like spots on fingers ACC 1445

## 2016-02-18 ENCOUNTER — Ambulatory Visit (HOSPITAL_COMMUNITY)
Admission: RE | Admit: 2016-02-18 | Discharge: 2016-02-18 | Disposition: A | Discharge status: Home / Self Care | Payer: Medicare Other | Source: Ambulatory Visit | Attending: Oncology | Admitting: Oncology

## 2016-02-18 DIAGNOSIS — I780 Hereditary hemorrhagic telangiectasia: Secondary | ICD-10-CM | POA: Diagnosis present

## 2016-02-18 DIAGNOSIS — K31811 Angiodysplasia of stomach and duodenum with bleeding: Secondary | ICD-10-CM | POA: Insufficient documentation

## 2016-02-18 LAB — PREPARE RBC (CROSSMATCH)

## 2016-02-18 MED ORDER — SODIUM CHLORIDE 0.9% FLUSH
10.0000 mL | INTRAVENOUS | Status: AC | PRN
  Administered 2016-02-18: 10 mL

## 2016-02-18 MED ORDER — SODIUM CHLORIDE 0.9 % IV SOLN
Freq: Once | INTRAVENOUS | Status: AC
  Administered 2016-02-18: 11:00:00 via INTRAVENOUS

## 2016-02-18 MED ORDER — HEPARIN SOD (PORK) LOCK FLUSH 100 UNIT/ML IV SOLN
500.0000 [IU] | INTRAVENOUS | Status: AC | PRN
  Administered 2016-02-18: 500 [IU]
  Filled 2016-02-18: qty 5

## 2016-02-18 MED ORDER — CETAPHIL MOISTURIZING EX LOTN: 1.0000 "application " | TOPICAL_LOTION | CUTANEOUS | 0 refills | Status: DC | PRN

## 2016-02-18 MED ORDER — SODIUM CHLORIDE 0.9% FLUSH: 10.0000 mL | INTRAVENOUS | Status: DC | PRN

## 2016-02-18 MED ORDER — HEPARIN SOD (PORK) LOCK FLUSH 100 UNIT/ML IV SOLN: 250.0000 [IU] | INTRAVENOUS | Status: DC | PRN

## 2016-02-18 NOTE — Assessment & Plan Note (Signed)
A: [name] skin complaints are most consistent with dry skin and the time course fits due to very cold weather leading to low humidity and probably frequent use of indoor heat. There is no actual rash overlying the affected area to suggest an atopic dermatitis or other skin process. It is also possible with her current anemia and iron deficiency that this could be contributory to a fairly diffuse pruritus although it seems less likely since she has not had this problem in the past despite being frequently anemic.  P: Recommend Cetaphil lotion after showers and before bed or similar emollient Discussed potential benefit of humidifier if symptoms continue Instructed her to call back if there is no improvement with these treatments in a week or 2

## 2016-02-18 NOTE — Progress Notes (Signed)
Internal Medicine Clinic Attending  Case discussed with Dr. Rice at the time of the visit.  We reviewed the resident's history and exam and pertinent patient test results.  I agree with the assessment, diagnosis, and plan of care documented in the resident's note.  

## 2016-02-18 NOTE — Progress Notes (Signed)
Diagnosis Association: HHT (hereditary hemorrhagic telangiectasia) ([name] (I78.0); Gastric AVM (Q27.33)  Provider: Dr. [name]  Procedure: Pt received 2 units of PRBCs via porta cath  Pt tolerated procedure well.  Post procedure: Pt alert, oriented and ambulatory at discharge. Porta cath flushed per protocol. Discharge instructions given with verbal understanding.

## 2016-02-18 NOTE — Assessment & Plan Note (Addendum)
A: Hemoglobin 6.3 today but asymptomatic. This is almost certainly due to ongoing GI blood loss as she has had multiple telangiectasia bleeding sites along her GI tract requiring transfusions as well as iron supplementation. She denies hematochezia or hematemesis although does intermittently have dark-colored stools.  P: -Scheduled for transfusion of 2 units PRBCs on Tuesday 1/9 -Continue iron supplementation -Continue scheduled follow-up with repeat CBCs

## 2016-02-19 ENCOUNTER — Other Ambulatory Visit: Payer: Self-pay | Admitting: Internal Medicine

## 2016-02-19 LAB — TYPE AND SCREEN
BLOOD PRODUCT EXPIRATION DATE: 201801162359
BLOOD PRODUCT EXPIRATION DATE: 201801162359
ISSUE DATE / TIME: 201801091020
ISSUE DATE / TIME: 201801091020
UNIT TYPE AND RH: 6200
Unit Type and Rh: 6200

## 2016-03-02 ENCOUNTER — Observation Stay (HOSPITAL_COMMUNITY)
Admission: AD | Admit: 2016-03-02 | Discharge: 2016-03-04 | Disposition: A | Discharge status: Home / Self Care | Payer: Medicare Other | Source: Ambulatory Visit | Attending: Internal Medicine | Admitting: Internal Medicine

## 2016-03-02 ENCOUNTER — Other Ambulatory Visit: Payer: Self-pay | Admitting: Oncology

## 2016-03-02 ENCOUNTER — Encounter (HOSPITAL_COMMUNITY): Payer: Self-pay | Admitting: Internal Medicine

## 2016-03-02 ENCOUNTER — Telehealth: Payer: Self-pay | Admitting: *Deleted

## 2016-03-02 ENCOUNTER — Other Ambulatory Visit (INDEPENDENT_AMBULATORY_CARE_PROVIDER_SITE_OTHER): Payer: Medicare Other

## 2016-03-02 DIAGNOSIS — E1122 Type 2 diabetes mellitus with diabetic chronic kidney disease: Secondary | ICD-10-CM | POA: Insufficient documentation

## 2016-03-02 DIAGNOSIS — I780 Hereditary hemorrhagic telangiectasia: Secondary | ICD-10-CM | POA: Diagnosis present

## 2016-03-02 DIAGNOSIS — D62 Acute posthemorrhagic anemia: Secondary | ICD-10-CM

## 2016-03-02 DIAGNOSIS — I130 Hypertensive heart and chronic kidney disease with heart failure and stage 1 through stage 4 chronic kidney disease, or unspecified chronic kidney disease: Secondary | ICD-10-CM | POA: Diagnosis not present

## 2016-03-02 DIAGNOSIS — R569 Unspecified convulsions: Secondary | ICD-10-CM | POA: Diagnosis not present

## 2016-03-02 DIAGNOSIS — E7220 Disorder of urea cycle metabolism, unspecified: Secondary | ICD-10-CM | POA: Diagnosis not present

## 2016-03-02 DIAGNOSIS — Z79899 Other long term (current) drug therapy: Secondary | ICD-10-CM | POA: Diagnosis not present

## 2016-03-02 DIAGNOSIS — D649 Anemia, unspecified: Secondary | ICD-10-CM

## 2016-03-02 DIAGNOSIS — I5032 Chronic diastolic (congestive) heart failure: Secondary | ICD-10-CM | POA: Insufficient documentation

## 2016-03-02 DIAGNOSIS — K219 Gastro-esophageal reflux disease without esophagitis: Secondary | ICD-10-CM | POA: Insufficient documentation

## 2016-03-02 DIAGNOSIS — I480 Paroxysmal atrial fibrillation: Secondary | ICD-10-CM | POA: Insufficient documentation

## 2016-03-02 DIAGNOSIS — K31811 Angiodysplasia of stomach and duodenum with bleeding: Secondary | ICD-10-CM | POA: Diagnosis not present

## 2016-03-02 DIAGNOSIS — Q2733 Arteriovenous malformation of digestive system vessel: Secondary | ICD-10-CM | POA: Insufficient documentation

## 2016-03-02 DIAGNOSIS — F0390 Unspecified dementia without behavioral disturbance: Secondary | ICD-10-CM | POA: Diagnosis not present

## 2016-03-02 DIAGNOSIS — N183 Chronic kidney disease, stage 3 (moderate): Secondary | ICD-10-CM | POA: Insufficient documentation

## 2016-03-02 DIAGNOSIS — D500 Iron deficiency anemia secondary to blood loss (chronic): Principal | ICD-10-CM | POA: Insufficient documentation

## 2016-03-02 DIAGNOSIS — Z794 Long term (current) use of insulin: Secondary | ICD-10-CM | POA: Diagnosis not present

## 2016-03-02 DIAGNOSIS — K31819 Angiodysplasia of stomach and duodenum without bleeding: Secondary | ICD-10-CM

## 2016-03-02 LAB — CBC WITH DIFFERENTIAL/PLATELET
BASOS ABS: 0 10*3/uL (ref 0.0–0.1)
Basophils Relative: 0 %
EOS PCT: 6 %
Eosinophils Absolute: 0.3 10*3/uL (ref 0.0–0.7)
HEMATOCRIT: 18.1 % — AB (ref 36.0–46.0)
Hemoglobin: 5.2 g/dL — CL (ref 12.0–15.0)
LYMPHS ABS: 0.7 10*3/uL (ref 0.7–4.0)
Lymphocytes Relative: 15 %
MCH: 21.4 pg — AB (ref 26.0–34.0)
MCHC: 28.7 g/dL — AB (ref 30.0–36.0)
MCV: 74.5 fL — AB (ref 78.0–100.0)
MONOS PCT: 6 %
Monocytes Absolute: 0.3 10*3/uL (ref 0.1–1.0)
NEUTROS ABS: 3.3 10*3/uL (ref 1.7–7.7)
Neutrophils Relative %: 73 %
Platelets: 217 10*3/uL (ref 150–400)
RBC: 2.43 MIL/uL — ABNORMAL LOW (ref 3.87–5.11)
RDW: 19.1 % — AB (ref 11.5–15.5)
WBC: 4.6 10*3/uL (ref 4.0–10.5)

## 2016-03-02 LAB — GLUCOSE, CAPILLARY: Glucose-Capillary: 374 mg/dL — ABNORMAL HIGH (ref 65–99)

## 2016-03-02 LAB — SAMPLE TO BLOOD BANK

## 2016-03-02 LAB — PREPARE RBC (CROSSMATCH)

## 2016-03-02 MED ORDER — SODIUM CHLORIDE 0.9 % IV SOLN
Freq: Once | INTRAVENOUS | Status: AC
  Administered 2016-03-03: 01:00:00 via INTRAVENOUS

## 2016-03-02 MED ORDER — POTASSIUM CHLORIDE CRYS ER 20 MEQ PO TBCR
20.0000 meq | EXTENDED_RELEASE_TABLET | Freq: Every day | ORAL | Status: DC
  Administered 2016-03-03 – 2016-03-04 (×2): 20 meq via ORAL
  Filled 2016-03-02 (×2): qty 1

## 2016-03-02 MED ORDER — PRAMIPEXOLE DIHYDROCHLORIDE 0.125 MG PO TABS
0.1250 mg | ORAL_TABLET | Freq: Every day | ORAL | Status: DC
  Administered 2016-03-03 – 2016-03-04 (×2): 0.125 mg via ORAL
  Filled 2016-03-02 (×2): qty 1

## 2016-03-02 MED ORDER — SODIUM CHLORIDE 0.9 % IV SOLN
510.0000 mg | INTRAVENOUS | Status: AC
  Administered 2016-03-02: 510 mg via INTRAVENOUS
  Filled 2016-03-02: qty 17

## 2016-03-02 MED ORDER — INSULIN ASPART 100 UNIT/ML ~~LOC~~ SOLN
0.0000 [IU] | Freq: Three times a day (TID) | SUBCUTANEOUS | Status: DC
  Administered 2016-03-03 – 2016-03-04 (×3): 3 [IU] via SUBCUTANEOUS

## 2016-03-02 MED ORDER — PANTOPRAZOLE SODIUM 40 MG PO TBEC
40.0000 mg | DELAYED_RELEASE_TABLET | Freq: Two times a day (BID) | ORAL | Status: DC
  Administered 2016-03-02 – 2016-03-04 (×4): 40 mg via ORAL
  Filled 2016-03-02 (×4): qty 1

## 2016-03-02 MED ORDER — SODIUM CHLORIDE 0.9% FLUSH
10.0000 mL | INTRAVENOUS | Status: DC | PRN
  Administered 2016-03-04: 10 mL
  Filled 2016-03-02: qty 40

## 2016-03-02 MED ORDER — CETAPHIL MOISTURIZING EX LOTN
1.0000 "application " | TOPICAL_LOTION | CUTANEOUS | Status: DC | PRN
  Filled 2016-03-02: qty 118

## 2016-03-02 MED ORDER — DONEPEZIL HCL 10 MG PO TABS
10.0000 mg | ORAL_TABLET | Freq: Every day | ORAL | Status: DC
  Administered 2016-03-02 – 2016-03-03 (×2): 10 mg via ORAL
  Filled 2016-03-02 (×2): qty 1

## 2016-03-02 MED ORDER — ACETAMINOPHEN 325 MG PO TABS
650.0000 mg | ORAL_TABLET | Freq: Four times a day (QID) | ORAL | Status: DC | PRN
  Administered 2016-03-03: 650 mg via ORAL
  Filled 2016-03-02: qty 2

## 2016-03-02 MED ORDER — FUROSEMIDE 20 MG PO TABS
20.0000 mg | ORAL_TABLET | Freq: Every day | ORAL | Status: DC
  Administered 2016-03-03 – 2016-03-04 (×2): 20 mg via ORAL
  Filled 2016-03-02 (×2): qty 1

## 2016-03-02 MED ORDER — MEMANTINE HCL 10 MG PO TABS
10.0000 mg | ORAL_TABLET | Freq: Two times a day (BID) | ORAL | Status: DC
  Administered 2016-03-02 – 2016-03-04 (×4): 10 mg via ORAL
  Filled 2016-03-02 (×4): qty 1

## 2016-03-02 MED ORDER — CALCIUM CARBONATE-VITAMIN D 500-200 MG-UNIT PO TABS
1.0000 | ORAL_TABLET | Freq: Two times a day (BID) | ORAL | Status: DC
  Administered 2016-03-03 – 2016-03-04 (×3): 1 via ORAL
  Filled 2016-03-02 (×3): qty 1

## 2016-03-02 MED ORDER — LEVETIRACETAM 500 MG PO TABS
500.0000 mg | ORAL_TABLET | Freq: Two times a day (BID) | ORAL | Status: DC
  Administered 2016-03-02 – 2016-03-04 (×4): 500 mg via ORAL
  Filled 2016-03-02 (×4): qty 1

## 2016-03-02 MED ORDER — LUBIPROSTONE 24 MCG PO CAPS
24.0000 ug | ORAL_CAPSULE | Freq: Two times a day (BID) | ORAL | Status: DC
  Administered 2016-03-03 – 2016-03-04 (×3): 24 ug via ORAL
  Filled 2016-03-02 (×4): qty 1

## 2016-03-02 MED ORDER — INSULIN DETEMIR 100 UNIT/ML ~~LOC~~ SOLN
20.0000 [IU] | Freq: Two times a day (BID) | SUBCUTANEOUS | Status: DC
  Administered 2016-03-02 – 2016-03-04 (×4): 20 [IU] via SUBCUTANEOUS
  Filled 2016-03-02 (×5): qty 0.2

## 2016-03-02 MED ORDER — ACETAMINOPHEN 650 MG RE SUPP: 650.0000 mg | Freq: Four times a day (QID) | RECTAL | Status: DC | PRN

## 2016-03-02 MED ORDER — LACTULOSE 10 GM/15ML PO SOLN
10.0000 g | Freq: Two times a day (BID) | ORAL | Status: DC
  Administered 2016-03-02 – 2016-03-04 (×4): 10 g via ORAL
  Filled 2016-03-02 (×4): qty 15

## 2016-03-02 MED ORDER — OXYMETAZOLINE HCL 0.05 % NA SOLN
1.0000 | Freq: Two times a day (BID) | NASAL | Status: DC | PRN
  Filled 2016-03-02: qty 15

## 2016-03-02 MED ORDER — FERROUS FUMARATE 324 (106 FE) MG PO TABS
1.0000 | ORAL_TABLET | Freq: Two times a day (BID) | ORAL | Status: DC
  Administered 2016-03-03 – 2016-03-04 (×3): 106 mg via ORAL
  Filled 2016-03-02 (×4): qty 1

## 2016-03-02 NOTE — Telephone Encounter (Signed)
Pt's hgb is 5.2; unable to get pt in today for blood transfusion. Dr [name] decided to adm pt for 24 hr observation.  Called pt and explained reason for admission; pt agreed. Informed bed control , who has been called , will be calling when bed is ready. C[facility].

## 2016-03-02 NOTE — H&P (Signed)
Date: 03/02/2016               Patient Name:  [name] MRN: [id number]  DOB: 1940/05/27 Age / Sex: 76 y.o., female   PCP: [name], DO         Medical Service: Internal Medicine Teaching Service         Attending Physician: Dr. Lucious Groves, DO    First Contact: Dr. Hetty Ely Pager: [phone]  Second Contact: Dr. Juleen China Pager: [phone]       After Hours (After 5p/  First Contact Pager: [phone]  weekends / holidays): Second Contact Pager: [phone]   Chief Complaint: anemia  History of Present Illness:  Ms. Macapagal is a 76yo female with PMH of hereditary hemorrhagic telangiectasia associated with gastric AVMs and chronic anemia, chronic diastolic CHF, T6RW, GERD, PAF, dementia, hyperammonemia and seizures admitted for acute on chronic anemia.  Patient had regular CBC follow up in Heme clinic and found to have hgb of 5.2. Timely outpatient transfusion was unable to be arranged so she was admitted for transfusion. Patient states that she had 2 bloody emesis episodes about 2 weeks ago, and a couple of episodes of melena 3 weeks ago, but no bleeding episodes more recently. She denies dizziness, shortness of breath, chest pain, syncope. She denies fevers, chills, cough, abdominal pain, no more N/V/D, urinary symptoms, headaches, vision or hearing changes, numbness, tingling, focal weakness.   Meds:  Amitiza 26mcg daily Calcium citrate-vitamin D 315-200mg -unit BID Cetaphil lotion Diclofenac sodium 1% gel QID  Donepezil 10mg  daily Ferrous fumarate 325mg  BID Furosemide 20mg  daily Glipizide 2.5mg  daily Levemir 20U BID KLOR-CON 40mEq daily Lactulose 10g BID Levetiracetam 500mg  BID Memantine 10mg  BID Oxymetazoline 0.05% nasal spray Pantoprazole 40mg  BID Pramipexole 0.125mg  daily  Allergies: Allergies as of 03/02/2016 - Review Complete 02/18/2016  Allergen Reaction Noted  . Aspirin Nausea And Vomiting 05/12/2006   Past Medical History:  Diagnosis Date  . CHF (congestive  heart failure) ([place])   . Chronic anemia   . Chronic diastolic CHF (congestive heart failure) ([place]) 10/03/2013  . Chronic GI bleeding    [name] 11/29/2014  . Family history of anesthesia complication    "niece has a hard time coming out" (09/15/2012)  . Frequent nosebleeds    chronic  . Gastric AV malformation    [name] 11/29/2014  . GERD (gastroesophageal reflux disease)   . Heart murmur   . History of blood transfusion "several"  . History of epistaxis   . HTN (hypertension), benign 03/02/2012  . Hyperlipidemia   . Iron deficiency anemia    chronic infusions"  . Lichen planus    Both lower extremities  . 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 Pneumonia 1990's X 2  . Seizures ([place]) 09/2014  . Telangiectasia    Gastric   . 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    Family History: Patient denies family history of heart, lung, or bleeding disorders  Social History: Patient denies current or past use of tobacco, alcohol or drug use.  Review of Systems: A complete ROS was negative except as per HPI.   Physical Exam: Blood pressure (!) 115/40, pulse 66, temperature 98.9 F (37.2 C), temperature source Oral, resp. rate 18, height 5\' 3"  (1.6 m), weight 139 lb (63 kg), SpO2 100 %. General: alert, well-developed, and cooperative to examination.  Head: normocephalic and atraumatic.  Eyes: vision grossly intact, pupils equal, pupils round, pupils reactive to  light, no injection and anicteric. Pale conjunctiva. Mouth: pharynx pink and moist, no erythema, and no exudates.  Neck: supple, full ROM, no thyromegaly.  Lungs: normal respiratory effort, no accessory muscle use, normal breath sounds, no crackles, and no wheezes. Heart: normal rate, regular rhythm, 3/6 systolic ejection murmur best heard at LUSB, no gallop, and no rub.  Abdomen: soft, non-tender, normal bowel sounds, no distention, no guarding, no rebound tenderness.  Msk: no joint swelling, no  joint warmth, and no redness over joints.  Pulses: 2+ DP/PT pulses bilaterally Extremities: No cyanosis, clubbing, edema. Pale nailbeds but capillary refill <3 sec.  Neurologic: alert & oriented X3, cranial nerves II-XII intact, strength normal in all extremities, sensation intact to light touch.  Skin: turgor normal and no rashes. Small telangiectasias on fingertips bilaterally Psych: Oriented X3, memory intact for recent and remote, normally interactive, good eye contact, not anxious appearing, and not depressed appearing  Labs: WBC 4.6, Hgb 5.2, Hct 18.1, Plts 217  Assessment & Plan by Problem: Principal Problem:   HHT (hereditary hemorrhagic telangiectasia) ([name]  Acute on chronic anemia 2/2 GI AVM: Patient with h/o anemia 2/2 GI AVM's in setting of HHT. Hbg 5.2 today and patient asymptomatic.  --transfuse 2U pRBC's --Ferreheme --f/u post-transfusion CBC and AM CBC --needs iron supplement refill on discharge  Chronic diastolic CHF: Patient on home regimen of furosemide 20mg  daily and potassium 21mEq daily; these were continued. On exam she appeared euvolemic. --furosemide 20mg  daily --potassium 41mEq daily --f/u AM BMP  T2DM: Patient on glipizide 2.5mg  daily, Levemir 20U BID which were continued. CBG on admission was 374.  PAF: Patient with history of PAF not on anticoagulation due to h/o GI bleeding as above and not on rate control due to chronic bradycardia. On admission her rhythm is regular. CHADS2VASC2=7.   Dementia: Patient with h/o dementia on donepezil, memantine; these were continued.  Hyperammonemia: Patient continued on lactulose.  H/o seizures: Patient thought to have had seizures 2/2 hyperammonemia. She was continued on her home Keppra 500mg  BID.  GERD: Patient continued on pantoprazole 40mg  BID.   Restless legs: Patient continued on home pramipexole 0.125mg  daily  Diet: HH/CM IVF: none DVT: SCD's Code: Partial - In setting of cardiopulmonary  arrest, patient does not wish for intubation, but does wish for chest compressions and shocks   Dispo: Admit patient to Observation with expected length of stay less than 2 midnights.  Signed: Alphonzo Grieve, MD 03/02/2016, 10:06 PM  Pager [phone]

## 2016-03-03 ENCOUNTER — Encounter (HOSPITAL_COMMUNITY): Payer: Medicare Other

## 2016-03-03 DIAGNOSIS — D500 Iron deficiency anemia secondary to blood loss (chronic): Secondary | ICD-10-CM | POA: Diagnosis not present

## 2016-03-03 DIAGNOSIS — I5032 Chronic diastolic (congestive) heart failure: Secondary | ICD-10-CM | POA: Diagnosis not present

## 2016-03-03 DIAGNOSIS — E7220 Disorder of urea cycle metabolism, unspecified: Secondary | ICD-10-CM

## 2016-03-03 DIAGNOSIS — I780 Hereditary hemorrhagic telangiectasia: Secondary | ICD-10-CM | POA: Diagnosis not present

## 2016-03-03 DIAGNOSIS — Q2733 Arteriovenous malformation of digestive system vessel: Secondary | ICD-10-CM | POA: Diagnosis not present

## 2016-03-03 DIAGNOSIS — K31811 Angiodysplasia of stomach and duodenum with bleeding: Secondary | ICD-10-CM

## 2016-03-03 DIAGNOSIS — G40909 Epilepsy, unspecified, not intractable, without status epilepticus: Secondary | ICD-10-CM

## 2016-03-03 DIAGNOSIS — F0390 Unspecified dementia without behavioral disturbance: Secondary | ICD-10-CM | POA: Diagnosis not present

## 2016-03-03 DIAGNOSIS — R001 Bradycardia, unspecified: Secondary | ICD-10-CM

## 2016-03-03 DIAGNOSIS — K219 Gastro-esophageal reflux disease without esophagitis: Secondary | ICD-10-CM | POA: Diagnosis not present

## 2016-03-03 DIAGNOSIS — Z794 Long term (current) use of insulin: Secondary | ICD-10-CM

## 2016-03-03 DIAGNOSIS — Z79899 Other long term (current) drug therapy: Secondary | ICD-10-CM

## 2016-03-03 DIAGNOSIS — G2581 Restless legs syndrome: Secondary | ICD-10-CM

## 2016-03-03 DIAGNOSIS — I480 Paroxysmal atrial fibrillation: Secondary | ICD-10-CM | POA: Diagnosis not present

## 2016-03-03 DIAGNOSIS — Z9889 Other specified postprocedural states: Secondary | ICD-10-CM

## 2016-03-03 DIAGNOSIS — Z886 Allergy status to analgesic agent status: Secondary | ICD-10-CM

## 2016-03-03 DIAGNOSIS — E1165 Type 2 diabetes mellitus with hyperglycemia: Secondary | ICD-10-CM

## 2016-03-03 DIAGNOSIS — D62 Acute posthemorrhagic anemia: Secondary | ICD-10-CM

## 2016-03-03 LAB — CBC
HCT: 25 % — ABNORMAL LOW (ref 36.0–46.0)
Hemoglobin: 7.8 g/dL — ABNORMAL LOW (ref 12.0–15.0)
MCH: 23.9 pg — AB (ref 26.0–34.0)
MCHC: 31.2 g/dL (ref 30.0–36.0)
MCV: 76.7 fL — AB (ref 78.0–100.0)
PLATELETS: 194 10*3/uL (ref 150–400)
RBC: 3.26 MIL/uL — ABNORMAL LOW (ref 3.87–5.11)
RDW: 19.2 % — AB (ref 11.5–15.5)
WBC: 5.4 10*3/uL (ref 4.0–10.5)

## 2016-03-03 LAB — BASIC METABOLIC PANEL
Anion gap: 8 (ref 5–15)
BUN: 10 mg/dL (ref 6–20)
CALCIUM: 8.7 mg/dL — AB (ref 8.9–10.3)
CHLORIDE: 107 mmol/L (ref 101–111)
CO2: 26 mmol/L (ref 22–32)
CREATININE: 1.14 mg/dL — AB (ref 0.44–1.00)
GFR calc Af Amer: 53 mL/min — ABNORMAL LOW (ref >60)
GFR calc non Af Amer: 46 mL/min — ABNORMAL LOW (ref >60)
Glucose, Bld: 239 mg/dL — ABNORMAL HIGH (ref 65–99)
Potassium: 3.5 mmol/L (ref 3.5–5.1)
SODIUM: 141 mmol/L (ref 135–145)

## 2016-03-03 LAB — GLUCOSE, CAPILLARY
GLUCOSE-CAPILLARY: 157 mg/dL — AB (ref 65–99)
GLUCOSE-CAPILLARY: 237 mg/dL — AB (ref 65–99)
GLUCOSE-CAPILLARY: 279 mg/dL — AB (ref 65–99)
GLUCOSE-CAPILLARY: 83 mg/dL (ref 65–99)
Glucose-Capillary: 152 mg/dL — ABNORMAL HIGH (ref 65–99)
Glucose-Capillary: 163 mg/dL — ABNORMAL HIGH (ref 65–99)

## 2016-03-03 LAB — URINALYSIS, COMPLETE (UACMP) WITH MICROSCOPIC

## 2016-03-03 LAB — PREPARE RBC (CROSSMATCH)

## 2016-03-03 MED ORDER — ALUM & MAG HYDROXIDE-SIMETH 200-200-20 MG/5ML PO SUSP
15.0000 mL | ORAL | Status: DC | PRN
  Administered 2016-03-03: 15 mL via ORAL
  Filled 2016-03-03: qty 30

## 2016-03-03 MED ORDER — SODIUM CHLORIDE 0.9 % IV SOLN: 510.0000 mg | INTRAVENOUS | Status: DC

## 2016-03-03 MED ORDER — SODIUM CHLORIDE 0.9 % IV SOLN
Freq: Once | INTRAVENOUS | Status: AC
  Administered 2016-03-03: 12:00:00 via INTRAVENOUS

## 2016-03-03 MED ORDER — SODIUM CHLORIDE 0.9 % IV SOLN
Freq: Three times a day (TID) | INTRAVENOUS | Status: DC
  Administered 2016-03-03 – 2016-03-04 (×3): via INTRAVENOUS

## 2016-03-03 NOTE — H&P (Signed)
Internal Medicine Attending Admission Note  I saw and evaluated the patient. I reviewed the resident's note and I agree with the resident's findings and plan as documented in the resident's note.  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  She received IV ferraheme last night. - Planned to give 1 additional unite of pRBC and discharge post however during administration she developed chills transfusion was held, I personally reevaluated her, she was afebrile, she described "muscle cramps," no itching, hives, SOB, back pain, HA, N/V.  I asked nursing to continue NS for 30 minutes, contact blood services to confirm correct blood products and she could resume at slower rate if blood bank agreed.  I have been informed by Dr patel that she has subsequently had a fever on resumption.  Blood bank will be contacted again to discuss potential transfusion reaction testing.  She will likely remain overnight.  Chief Complaint(s):anemia  History - key components related to admission: Briefly [name] is a 76 yo female with history of HHT complicated by gastric AVMs and chronic (transfusion dependent) anemia, as well as dCHF, T2DM, PAF, seizures that follows with Dr [name] for her chronic anemia.  She presented to his clinic yesterday for a CBC (she receives weekly CBCs for monitoring) and her Hgb was found to have decreased to 5.2.  She overall did not have any acute symptoms from this.  Her last transfusion was on Jan 9th where she received 2 units in short stay after a hgb of 6.3.  Dr [name] attempted to arrange for short stay transfusion but there was no availability so he asked if we would admit her for management of her anemia.   Today she feels "better" after 2 units, however she denies any specific complaints of faituge, chest pain,  SOB.  She has had some intermitent dark stools, no gross bloody BM, no hematuria, no hematemesis,  Lab results: Reviewed in Epic  Physical Exam - key components related to admission: General: resting in bed, no distress HEENT: EOMI, no scleral icterus Cardiac: RRR, 2/6 murmur Pulm: clear to auscultation bilaterally Abd: soft, nontender, nondistended, BS present Ext: warm and well perfused, 1+ pedal edema Neuro: alert and oriented X3  Vitals:   03/03/16 1319 03/03/16 1458 03/03/16 1459 03/03/16 1500  BP: 123/75  (!) 126/27   Pulse:   73   Resp:   18   Temp: 99.2 F (37.3 C) (!) 101.1 F (38.4 C)  (!) 101.1 F (38.4 C)  TempSrc: Oral Oral    SpO2:   100%   Weight:      Height:

## 2016-03-03 NOTE — Progress Notes (Signed)
Hematology: I greatly appreciate medicine house staff assistance in admitting this patient for transfusion. 76 year old woman well known to me. She has hereditary hemorrhagic telangiectasias. Sites of bleeding include nasopharynx and GI tract with ongoing, chronic, epistaxis and GI bleeding. Severity of bleeding episodes has been very variable. At times of brisk bleeding she has required endoscopy with laser ablation of gastric AVMs. I've been monitoring weekly blood counts. She has required blood transfusions about every 2-3 weeks. Intermittent IV iron. Most recent blood transfusion on January 9. Hemoglobin was 6.3 on January 8. She always gets 2 units. She came in for a routine CBC this morning. Hemoglobin 5.2. Amazingly asymptomatic. We could not find a open outpatient bed for transfusion either at [facility], or [facility] short stay units, the cancer center, or the [facility] center. It then took the rest of the day before a hospital bed opened.  She denies any chest pain or pressure. No palpitations. No dyspnea at rest. I examined the patient last night at approximately 8 PM. Blood pressure 134/41, pulse 69 regular, respirations 20, temperature 98.6. Small hemangiomas on the tongue, lips, fingertips. Lungs with minimal basilar rales. Resonant to percussion throughout. Regular cardiac rhythm with 3/6 systolic murmur at the left sternal border and second right intercostal space. Abdomen soft and nontender with no mass or organomegaly Extremities no edema no calf tenderness Neurologic: Mental status intact, pupils equal round reactive to light, no gross focal deficits. Skin: Chronic lichen planus dermatitis skin of the lower extremities  Impression: #1. Hereditary hemorrhagic telangiectasias ( HHT). #2. Ongoing GI bleeding and intermittent epistaxis secondary to #1 #3. Transfusion dependent anemia secondary to #1 and 2  Recommendation: Proceed with transfusion of 2 units of packed red  cells. I would also give her a dose of parenteral Feraheme iron. Most recent ferritin recorded on December 18 was 12.

## 2016-03-03 NOTE — Care Management Obs Status (Signed)
[facility] NOTIFICATION   Patient Details  Name: [name] MRN: [id number] Date of Birth: 10/08/1940   Medicare Observation Status Notification Given:  Yes    Carles Collet, RN 03/03/2016, 12:12 PM

## 2016-03-03 NOTE — Progress Notes (Signed)
  Subjective: [name] says she is feeling well today and remains free of concerns or complaints. She denies lightheadedness, chest pain, or difficulty breathing. She denies episodes of GI bleeding since the time of admission.   Objective:  Vital signs in last 24 hours: Vitals:   03/03/16 0043 03/03/16 0105 03/03/16 0300 03/03/16 0542  BP: 113/61 (!) 117/58 (!) 114/56 (!) 123/47  Pulse: 78 67 60 66  Resp: 18 18 18 18   Temp: 98.9 F (37.2 C) 98.9 F (37.2 C) 98.7 F (37.1 C) 98.9 F (37.2 C)  TempSrc: Oral Oral Oral Oral  SpO2: 98% 100% 100% 98%  Weight:      Height:       Physical Exam  Constitutional: She appears well-developed and well-nourished. No distress.  Cardiovascular: Normal rate and regular rhythm.   Murmur heard. Pulmonary/Chest: Effort normal and breath sounds normal. No respiratory distress. She has no wheezes. She has no rales.  Abdominal: Soft. Bowel sounds are normal. She exhibits no distension. There is no tenderness. There is no guarding.  Skin: She is not diaphoretic.    Assessment/Plan: Pt is a 76 y.o. yo female with a PMHx of hereditary hemorrhagic telengectasias with chronic GI blood loss, diastolic CHF, HTN, type 2 DM, Paroxysmal atrial fibrillation, and CKD stage 3 who was admitted on 03/02/2016 for low hemoglobin after recent GI bleeds.   Principal Problem:   HHT (hereditary hemorrhagic telangiectasia) ([place]) Active Problems:   Anemia due to chronic blood loss   Angiodysplasia of stomach with hemorrhage   AVM (arteriovenous malformation) of stomach, acquired with hemorrhage  Acute on chronic anemia 2/2 GI AVMs Presented to hematology outpatient follow up and was found to have a hgb 5.2 was given 2 units of PRBCs and 1 unit of iron and hgb improved to 7.8 today. She lost access to outpatient iron tablets for the past month and given her history of multiple admissions and transfusions for hereditary hemorrhagic telangiectasia 1 further unit of  blood and dose of ferraheme would be good for her.  -transfuse 1 unit  -F/U post transfusion H&H  - will send a refill for iron supplementation at discharge   Chronic diastolic CHF  -continue home meds furosemide 20 mg qd and K 20 meq qd   Type 2 DM  CBG 150 today, improved from 200s on admission.   -continue home meds glipizide and levemir  -continue moderate sliding scale insulin   Paroxysmal afib  HR 60s today and regular rate and rhythm on exam. Not on anticoagulation dt hx of Gi bleeding, not on rate control dt chronic bradycardia.   Dementia Continue home meds donepezil, memantine  Hyperammonemia Continue lactulose.  H/o seizures continued home Keppra 500mg  BID.  GERD continue pantoprazole 40mg  BID.   Restless legs continue home pramipexole 0.125mg  daily  Dispo: Anticipated discharge today   Ledell Noss, MD 03/03/2016, 9:06 AM Pager: [phone]

## 2016-03-03 NOTE — Discharge Summary (Signed)
Name: [name] MRN: [id number] DOB: Aug 11, 1940 76 y.o. PCP: [name], DO  Date of Admission: 03/02/2016  6:31 PM Date of Discharge: 03/04/2016 Attending Physician: Lucious Groves, DO  Discharge Diagnosis: 1. 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   calcium citrate-vitamin D 315-200 MG-UNIT tablet Commonly known as:  CITRACAL+D Take 1 tablet by mouth 2 (two) times daily.   cetaphil lotion Apply 1 application topically as needed for dry skin.   diclofenac sodium 1 % Gel Commonly known as:  VOLTAREN Apply 2 g topically 4 (four) times daily.   donepezil 10 MG tablet Commonly known as:  ARICEPT Take 1 tablet (10 mg total) by mouth at bedtime.   ferrous fumarate 325 (106 Fe) MG Tabs tablet Commonly known as:  HEMOCYTE - 106 mg FE Take 1 tablet (106 mg of iron total) by mouth 2 (two) times daily. Reported on 01/30/2015   furosemide 20 MG tablet Commonly known as:  LASIX TAKE ONE TABLET BY MOUTH ONCE DAILY PLEASE  CALL  AND  SCHEDULE  AN  APPOINTMENT   glipiZIDE 5 MG tablet Commonly known as:  GLUCOTROL Take 0.5 tablets (2.5 mg total) by mouth daily before breakfast.   glucose blood test strip Commonly known as:  ONETOUCH VERIO Check blood sugar up to 3 times a day   Insulin Detemir 100 UNIT/ML Pen Commonly known as:  LEVEMIR FLEXTOUCH Inject 20 Units into the skin 2 (two) times daily.   KLOR-CON M20 20 MEQ tablet Generic drug:  potassium chloride SA TAKE ONE TABLET BY MOUTH ONCE DAILY   lactulose 10 GM/15ML solution Commonly known as:  CHRONULAC Take 15 mLs (10 g total) by mouth 2 (two) times daily.   levETIRAcetam 500 MG tablet Commonly known as:  KEPPRA TAKE  ONE TABLET BY MOUTH TWICE DAILY   memantine 10 MG tablet Commonly known as:  NAMENDA Take 1 tablet (10 mg total) by mouth 2 (two) times daily.   ONETOUCH DELICA LANCETS FINE Misc Check blood sugar up to 3 times a day   oxymetazoline 0.05 % nasal spray Commonly known as:  AFRIN Place 1 spray into both nostrils 2 (two) times daily as needed (Epistaxis).   pantoprazole 40 MG tablet Commonly known as:  PROTONIX Take 1 tablet (40 mg total) by mouth 2 (two) times daily.   pramipexole 0.125 MG tablet Commonly known as:  MIRAPEX Take 1 tablet (0.125 mg total) by mouth daily.      Disposition and follow-up:   [name] was discharged from [facility] in Stable condition.  At the hospital follow up visit please address:  1.  Chronic blood loss - Has she had any symptoms of anemia?   2.  Labs / imaging needed at time of follow-up: CBC   3.  Pending labs/ test needing follow-up: none   Follow-up Appointments:   Hospital Course by problem list:    HHT (hereditary hemorrhagic telangiectasia) ([place])   Anemia due to chronic blood loss   Angiodysplasia of stomach with hemorrhage   AVM (arteriovenous malformation) of stomach, acquired with hemorrhage 33- year-old woman with PMH hereditary hemorrhagic telangiectasia associated  with gastric AVMs and chronic anemia, chronic diastollic CHF, type 2 DM, GERD, paroxysmal atrial fibrillation, dementia, and seizures presented for acute on chronic anemia. She presented for regular 2 week check to hematology clinic and was found to have hemoglobin 5.2 and was admitted for transfusion. She reported a couple of episodes of melena 3 weeks prior to presentation. She denied dizziness, shortness of breath or chest pain. Physical exam at time of admission recealed hypotension with BP 115.40, pale conjunctiva, systolic ejection murmur and delayed capillary refill. Initially she was transfused 2 units of PRBCs and one dose of ferraheme. On  the second day of admission post transfusion CBC revealed hgb 7.8. Transfusion goal is 8 given her history of heart disease so an additional third unit of PRBCs and and additional dose of ferraheme were ordered. She developed muscle cramps with initiation of the third unit, the transfusion was stopped and blood services was contacted to confirm that the blood product was correct. They confirmed that the product was correct but with resumption of transfusion she developed fever (101.1) so the transfusion was stopped. Coombs test was negative.She denied itching, hives, difficulty breathing, back pain, headache, nausea or vomiting during these transfusions and was kept overnight for observation. On the day of discharge she was afebrile and had hgb 8.5. She was discharged with plans to resume regularly scheduled hematology follow up.   Discharge Vitals:   BP (!) 100/50 (BP Location: Right Arm)   Pulse (!) 57   Temp 98.4 F (36.9 C) (Oral)   Resp 18   Ht 5\' 3"  (1.6 m)   Wt 139 lb (63 kg)   SpO2 96%   BMI 24.62 kg/m   Discharge Instructions: Discharge Instructions    Call MD for:  difficulty breathing, headache or visual disturbances    Complete by:  As directed    Call MD for:  extreme fatigue    Complete by:  As directed    Call MD for:  persistant dizziness or light-headedness    Complete by:  As directed    Diet - low sodium heart healthy    Complete by:  As directed    Increase activity slowly    Complete by:  As directed       Signed: Ledell Noss, MD 03/10/2016, 3:19 PM   Pager: [phone]

## 2016-03-04 ENCOUNTER — Encounter (HOSPITAL_COMMUNITY): Payer: Self-pay

## 2016-03-04 DIAGNOSIS — Z886 Allergy status to analgesic agent status: Secondary | ICD-10-CM | POA: Diagnosis not present

## 2016-03-04 DIAGNOSIS — I780 Hereditary hemorrhagic telangiectasia: Secondary | ICD-10-CM | POA: Diagnosis not present

## 2016-03-04 DIAGNOSIS — D500 Iron deficiency anemia secondary to blood loss (chronic): Secondary | ICD-10-CM | POA: Diagnosis not present

## 2016-03-04 DIAGNOSIS — Q2733 Arteriovenous malformation of digestive system vessel: Secondary | ICD-10-CM | POA: Diagnosis not present

## 2016-03-04 LAB — TYPE AND SCREEN
ABO/RH(D): A POS
ANTIBODY SCREEN: NEGATIVE
UNIT DIVISION: 0
Unit division: 0
Unit division: 0

## 2016-03-04 LAB — TRANSFUSION REACTION
DAT C3: NEGATIVE
Post RXN DAT IgG: NEGATIVE

## 2016-03-04 LAB — CBC
HEMATOCRIT: 27.9 % — AB (ref 36.0–46.0)
HEMOGLOBIN: 8.5 g/dL — AB (ref 12.0–15.0)
MCH: 23.4 pg — ABNORMAL LOW (ref 26.0–34.0)
MCHC: 30.5 g/dL (ref 30.0–36.0)
MCV: 76.9 fL — ABNORMAL LOW (ref 78.0–100.0)
Platelets: 198 10*3/uL (ref 150–400)
RBC: 3.63 MIL/uL — ABNORMAL LOW (ref 3.87–5.11)
RDW: 19.3 % — ABNORMAL HIGH (ref 11.5–15.5)
WBC: 9.2 10*3/uL (ref 4.0–10.5)

## 2016-03-04 LAB — GLUCOSE, CAPILLARY
GLUCOSE-CAPILLARY: 84 mg/dL (ref 65–99)
GLUCOSE-CAPILLARY: 168 mg/dL — AB (ref 65–99)
Glucose-Capillary: 49 mg/dL — ABNORMAL LOW (ref 65–99)

## 2016-03-04 MED ORDER — FERROUS FUMARATE 325 (106 FE) MG PO TABS: 1.0000 | ORAL_TABLET | Freq: Two times a day (BID) | ORAL | 3 refills | Status: DC

## 2016-03-04 MED ORDER — HEPARIN SOD (PORK) LOCK FLUSH 100 UNIT/ML IV SOLN
500.0000 [IU] | INTRAVENOUS | Status: AC | PRN
  Administered 2016-03-04: 500 [IU]

## 2016-03-04 NOTE — Progress Notes (Signed)
  Subjective: [name] is sitting up in bed when seen during rounds this morning. She denies back pain, difficulty breathing, chest pain, or pain anywhere. She had one bowel movement overnight, it was non bloody.   Objective:  Vital signs in last 24 hours: Vitals:   03/03/16 1723 03/03/16 2228 03/04/16 0544 03/04/16 0550  BP: (!) 108/36 (!) 126/43 (!) 83/27 (!) 100/50  Pulse: 70 (!) 57 (!) 57   Resp:  18 18   Temp: (!) 100.7 F (38.2 C) 97.9 F (36.6 C) 98.4 F (36.9 C)   TempSrc: Oral Oral Oral   SpO2:  98% 96%   Weight:      Height:       Physical Exam  Constitutional: She appears well-developed and well-nourished. No distress.  Cardiovascular: Normal rate and regular rhythm.   Murmur heard. No peripheral edema   Pulmonary/Chest: Effort normal and breath sounds normal. No respiratory distress. She has no wheezes. She has no rales.  Abdominal: Soft. Bowel sounds are normal. She exhibits no distension. There is no tenderness. There is no guarding.  Skin: She is not diaphoretic.    Assessment/Plan: Pt is a 76 y.o. yo female with a PMHx of hereditary hemorrhagic telengectasias with chronic GI blood loss, diastolic CHF, HTN, type 2 DM, Paroxysmal atrial fibrillation, and CKD stage 3 who was admitted on 03/02/2016 for low hemoglobin after recent GI bleeds.   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 She was scheduled to have another unit and dose of iron yesterday but developed fever and muscle spasm during the blood transfusion and it had to be stopped. If her hemoglobin is above 8 today she  will be stable for discharge later this afternoon.  -follow up afternoon CBC  - will send a refill for iron supplementation at discharge   Chronic diastolic CHF  Euvolemic on exam today.  -continue home meds furosemide 20 mg qd and K 20 meq qd   Type 2 DM  CBG 80-150 overnight.  -continue home meds glipizide and levemir  -continue moderate sliding scale insulin   Paroxysmal afib  HR 60s today and regular rate and rhythm on exam. Not on anticoagulation dt hx of GI bleeding, not on rate control dt chronic bradycardia.   Dementia Continue home meds donepezil, memantine  Hyperammonemia Continue lactulose.  H/o seizures continued home Keppra 500mg  BID.  GERD continue pantoprazole 40mg  BID.   Restless legs continue home pramipexole 0.125mg  daily  Dispo: Anticipated discharge today   Ledell Noss, MD 03/04/2016, 8:59 AM Pager: [phone]

## 2016-03-04 NOTE — Care Management Note (Signed)
Case Management Note  Patient Details  Name: [name] MRN: [id number] Date of Birth: 1940/04/15  Subjective/Objective:                 Patient in obs for scheduled transfusion and possible transfusion reaction.    Action/Plan:  Anticipate DC to home today. No CM needs.  Expected Discharge Date:                  Expected Discharge Plan:  Home/Self Care  In-House Referral:     Discharge planning Services  CM Consult  Post Acute Care Choice:    Choice offered to:     DME Arranged:    DME Agency:     HH Arranged:    [place] Agency:     Status of Service:  Completed, signed off  If discussed at [employer] of Stay Meetings, dates discussed:    Additional Comments:  Carles Collet, RN 03/04/2016, 12:20 PM

## 2016-03-04 NOTE — Progress Notes (Signed)
Internal Medicine Attending:   I saw and examined the patient. I reviewed the resident's note and I agree with the resident's findings and plan as documented in the resident's note. Patient feels well on exam, ready to go home.  Coombs test negative.  CBC today shows improved Hgb to 8.5.  No need for further transfusion at this time. She may be discharge home, she will resume weekly CBC checks with Dr [name].

## 2016-03-04 NOTE — Progress Notes (Signed)
BP 83/27 automatic; 100/50 manual.  Pt is asymptomatic.  MD notified.  No new order received.  Will continue to monitor.

## 2016-03-16 ENCOUNTER — Other Ambulatory Visit (INDEPENDENT_AMBULATORY_CARE_PROVIDER_SITE_OTHER): Payer: Medicare Other

## 2016-03-16 DIAGNOSIS — K31819 Angiodysplasia of stomach and duodenum without bleeding: Secondary | ICD-10-CM

## 2016-03-16 DIAGNOSIS — I780 Hereditary hemorrhagic telangiectasia: Secondary | ICD-10-CM

## 2016-03-16 LAB — CBC WITH DIFFERENTIAL/PLATELET
BASOS PCT: 0 %
Basophils Absolute: 0 10*3/uL (ref 0.0–0.1)
EOS ABS: 0.2 10*3/uL (ref 0.0–0.7)
Eosinophils Relative: 3 %
HEMATOCRIT: 33 % — AB (ref 36.0–46.0)
HEMOGLOBIN: 10 g/dL — AB (ref 12.0–15.0)
Lymphocytes Relative: 8 %
Lymphs Abs: 0.6 10*3/uL — ABNORMAL LOW (ref 0.7–4.0)
MCH: 25.3 pg — AB (ref 26.0–34.0)
MCHC: 30.3 g/dL (ref 30.0–36.0)
MCV: 83.5 fL (ref 78.0–100.0)
MONOS PCT: 8 %
Monocytes Absolute: 0.6 10*3/uL (ref 0.1–1.0)
NEUTROS ABS: 5.9 10*3/uL (ref 1.7–7.7)
NEUTROS PCT: 81 %
Platelets: 197 10*3/uL (ref 150–400)
RBC: 3.95 MIL/uL (ref 3.87–5.11)
RDW: 24.4 % — ABNORMAL HIGH (ref 11.5–15.5)
WBC: 7.3 10*3/uL (ref 4.0–10.5)

## 2016-03-16 LAB — SAMPLE TO BLOOD BANK

## 2016-03-17 ENCOUNTER — Telehealth: Payer: Self-pay | Admitting: *Deleted

## 2016-03-17 NOTE — Telephone Encounter (Signed)
Pt called - talked to her husband - informed "blood count good. No transfusion this week" per Dr [name]. Informed hgb up to 10.0. Stated thank-you and he will tell Mrs Bulkley.

## 2016-03-17 NOTE — Telephone Encounter (Signed)
-----   Message from [name], MD sent at 03/16/2016  7:29 PM EST ----- Call pt: blood count good. No transfusion this week

## 2016-03-30 ENCOUNTER — Encounter: Payer: Self-pay | Admitting: *Deleted

## 2016-03-30 ENCOUNTER — Other Ambulatory Visit (INDEPENDENT_AMBULATORY_CARE_PROVIDER_SITE_OTHER): Payer: Medicare Other

## 2016-03-30 DIAGNOSIS — K31819 Angiodysplasia of stomach and duodenum without bleeding: Secondary | ICD-10-CM

## 2016-03-30 DIAGNOSIS — Q2733 Arteriovenous malformation of digestive system vessel: Secondary | ICD-10-CM

## 2016-03-30 DIAGNOSIS — I780 Hereditary hemorrhagic telangiectasia: Secondary | ICD-10-CM

## 2016-03-30 DIAGNOSIS — D509 Iron deficiency anemia, unspecified: Secondary | ICD-10-CM

## 2016-03-30 LAB — CBC WITH DIFFERENTIAL/PLATELET
BASOS ABS: 0.1 10*3/uL (ref 0.0–0.1)
BASOS PCT: 1 %
EOS ABS: 0.3 10*3/uL (ref 0.0–0.7)
Eosinophils Relative: 5 %
HCT: 27.1 % — ABNORMAL LOW (ref 36.0–46.0)
Hemoglobin: 8.6 g/dL — ABNORMAL LOW (ref 12.0–15.0)
Lymphocytes Relative: 12 %
Lymphs Abs: 0.8 10*3/uL (ref 0.7–4.0)
MCH: 28.1 pg (ref 26.0–34.0)
MCHC: 31.7 g/dL (ref 30.0–36.0)
MCV: 88.6 fL (ref 78.0–100.0)
MONO ABS: 0.5 10*3/uL (ref 0.1–1.0)
Monocytes Relative: 7 %
NEUTROS ABS: 5 10*3/uL (ref 1.7–7.7)
Neutrophils Relative %: 75 %
PLATELETS: 151 10*3/uL (ref 150–400)
RBC: 3.06 MIL/uL — ABNORMAL LOW (ref 3.87–5.11)
RDW: 22.7 % — AB (ref 11.5–15.5)
WBC: 6.7 10*3/uL (ref 4.0–10.5)

## 2016-03-30 LAB — SAMPLE TO BLOOD BANK

## 2016-03-30 LAB — FERRITIN: FERRITIN: 52 ng/mL (ref 11–307)

## 2016-03-31 ENCOUNTER — Telehealth: Payer: Self-pay | Admitting: *Deleted

## 2016-03-31 NOTE — Telephone Encounter (Signed)
Talked to pt's husband; "no blood this week but will need next week." per Dr [name]. Informed hgb 8.6. Called [facility] Short Stay - appt scheduled Monday 26th for TXM then Tues 27th for blood transfusion. Pt's husband called/informed.

## 2016-03-31 NOTE — Telephone Encounter (Signed)
-----   Message from [name], MD sent at 03/30/2016  4:40 PM EST ----- Call pt: no blood this week but will need next week. Let's try to get her a bed at [facility] short stay for 2/26

## 2016-04-03 ENCOUNTER — Other Ambulatory Visit (HOSPITAL_COMMUNITY): Payer: Self-pay | Admitting: *Deleted

## 2016-04-03 ENCOUNTER — Other Ambulatory Visit: Payer: Self-pay | Admitting: Oncology

## 2016-04-03 DIAGNOSIS — D500 Iron deficiency anemia secondary to blood loss (chronic): Secondary | ICD-10-CM

## 2016-04-03 DIAGNOSIS — I780 Hereditary hemorrhagic telangiectasia: Secondary | ICD-10-CM

## 2016-04-03 DIAGNOSIS — K31811 Angiodysplasia of stomach and duodenum with bleeding: Secondary | ICD-10-CM

## 2016-04-06 ENCOUNTER — Encounter: Payer: Self-pay | Admitting: Oncology

## 2016-04-06 ENCOUNTER — Encounter (HOSPITAL_COMMUNITY)
Admission: RE | Admit: 2016-04-06 | Discharge: 2016-04-06 | Disposition: A | Discharge status: Home / Self Care | Payer: Medicare Other | Source: Ambulatory Visit | Attending: Oncology | Admitting: Oncology

## 2016-04-06 ENCOUNTER — Other Ambulatory Visit: Payer: Self-pay | Admitting: Oncology

## 2016-04-06 ENCOUNTER — Other Ambulatory Visit (HOSPITAL_COMMUNITY): Payer: Self-pay | Admitting: *Deleted

## 2016-04-06 DIAGNOSIS — K31811 Angiodysplasia of stomach and duodenum with bleeding: Secondary | ICD-10-CM

## 2016-04-06 DIAGNOSIS — D500 Iron deficiency anemia secondary to blood loss (chronic): Secondary | ICD-10-CM | POA: Diagnosis present

## 2016-04-06 DIAGNOSIS — I780 Hereditary hemorrhagic telangiectasia: Secondary | ICD-10-CM | POA: Insufficient documentation

## 2016-04-06 LAB — CBC
HCT: 19.3 % — ABNORMAL LOW (ref 36.0–46.0)
HEMOGLOBIN: 6 g/dL — AB (ref 12.0–15.0)
MCH: 29.7 pg (ref 26.0–34.0)
MCHC: 31.1 g/dL (ref 30.0–36.0)
MCV: 95.5 fL (ref 78.0–100.0)
Platelets: 160 10*3/uL (ref 150–400)
RBC: 2.02 MIL/uL — AB (ref 3.87–5.11)
RDW: 22.1 % — ABNORMAL HIGH (ref 11.5–15.5)
WBC: 5.9 10*3/uL (ref 4.0–10.5)

## 2016-04-06 LAB — PREPARE RBC (CROSSMATCH)

## 2016-04-06 NOTE — Progress Notes (Signed)
Phone conversation with patient: Hereditary hemorrhagic telangiectasia with chronic GI blood loss. Hemoglobin 6 today.  Sample for type and cross obtained.  Transfusion is scheduled for tomorrow since it usually takes 24 hours to get crossmatched units in view of alloimmunization.  I called the patient at 4:30 PM after checking with the blood bank.  The units are available at this time.  The patient is not experiencing any dyspnea or chest pain at rest.  She has not seen any blood in her stool.  She had an episode of minor self-limited epistaxis.  I gave her the option of being admitted to the hospital tonight.  She would prefer to wait until 8 AM tomorrow to receive the blood as an outpatient.  I told her to have her family bring her to the emergency department tonight if she developed any obvious bleeding or chest pain.  She is used to a chronic anemia and has tolerated hemoglobins at this level.  I will alert my coverage.

## 2016-04-07 ENCOUNTER — Other Ambulatory Visit: Payer: Self-pay | Admitting: Oncology

## 2016-04-07 ENCOUNTER — Encounter (HOSPITAL_COMMUNITY)
Admission: RE | Admit: 2016-04-07 | Discharge: 2016-04-07 | Disposition: A | Discharge status: Home / Self Care | Payer: Medicare Other | Source: Ambulatory Visit | Attending: Oncology | Admitting: Oncology

## 2016-04-07 DIAGNOSIS — I780 Hereditary hemorrhagic telangiectasia: Secondary | ICD-10-CM | POA: Diagnosis not present

## 2016-04-07 DIAGNOSIS — M62838 Other muscle spasm: Secondary | ICD-10-CM

## 2016-04-07 DIAGNOSIS — D500 Iron deficiency anemia secondary to blood loss (chronic): Secondary | ICD-10-CM

## 2016-04-07 DIAGNOSIS — K31811 Angiodysplasia of stomach and duodenum with bleeding: Secondary | ICD-10-CM

## 2016-04-07 MED ORDER — SODIUM CHLORIDE 0.9 % IV SOLN: Freq: Once | INTRAVENOUS | Status: DC

## 2016-04-07 MED ORDER — BACLOFEN 10 MG PO TABS: 5.0000 mg | ORAL_TABLET | Freq: Two times a day (BID) | ORAL | 0 refills | Status: DC

## 2016-04-07 MED ORDER — HEPARIN SOD (PORK) LOCK FLUSH 100 UNIT/ML IV SOLN
INTRAVENOUS | Status: AC
  Administered 2016-04-07: 14:00:00 500 [IU]
  Filled 2016-04-07: qty 5

## 2016-04-09 LAB — TYPE AND SCREEN
ABO/RH(D): A POS
ANTIBODY SCREEN: NEGATIVE
UNIT DIVISION: 0
UNIT DIVISION: 0
UNIT DIVISION: 0
Unit division: 0

## 2016-04-09 LAB — BPAM RBC
Blood Product Expiration Date: 201803102359
Blood Product Expiration Date: 201803112359
Blood Product Expiration Date: 201803212359
Blood Product Expiration Date: 201803212359
ISSUE DATE / TIME: 201802252233
ISSUE DATE / TIME: 201802270820
ISSUE DATE / TIME: 201802271049
UNIT TYPE AND RH: 6200
Unit Type and Rh: 6200
Unit Type and Rh: 6200
Unit Type and Rh: 6200

## 2016-04-13 ENCOUNTER — Other Ambulatory Visit: Payer: Self-pay | Admitting: Oncology

## 2016-04-13 ENCOUNTER — Other Ambulatory Visit: Payer: Medicare Other

## 2016-04-13 DIAGNOSIS — I780 Hereditary hemorrhagic telangiectasia: Secondary | ICD-10-CM

## 2016-04-13 DIAGNOSIS — D500 Iron deficiency anemia secondary to blood loss (chronic): Secondary | ICD-10-CM

## 2016-04-13 DIAGNOSIS — K31811 Angiodysplasia of stomach and duodenum with bleeding: Secondary | ICD-10-CM

## 2016-04-13 DIAGNOSIS — K31819 Angiodysplasia of stomach and duodenum without bleeding: Secondary | ICD-10-CM

## 2016-04-13 LAB — CBC WITH DIFFERENTIAL/PLATELET
BASOS ABS: 0 10*3/uL (ref 0.0–0.1)
Basophils Relative: 0 %
EOS ABS: 0.3 10*3/uL (ref 0.0–0.7)
EOS PCT: 6 %
HCT: 24.4 % — ABNORMAL LOW (ref 36.0–46.0)
HEMOGLOBIN: 7.5 g/dL — AB (ref 12.0–15.0)
Lymphocytes Relative: 11 %
Lymphs Abs: 0.6 10*3/uL — ABNORMAL LOW (ref 0.7–4.0)
MCH: 29.5 pg (ref 26.0–34.0)
MCHC: 30.7 g/dL (ref 30.0–36.0)
MCV: 96.1 fL (ref 78.0–100.0)
Monocytes Absolute: 0.4 10*3/uL (ref 0.1–1.0)
Monocytes Relative: 7 %
NEUTROS PCT: 76 %
Neutro Abs: 4.2 10*3/uL (ref 1.7–7.7)
Platelets: 171 10*3/uL (ref 150–400)
RBC: 2.54 MIL/uL — AB (ref 3.87–5.11)
RDW: 17.6 % — ABNORMAL HIGH (ref 11.5–15.5)
WBC: 5.5 10*3/uL (ref 4.0–10.5)

## 2016-04-14 ENCOUNTER — Telehealth: Payer: Self-pay | Admitting: *Deleted

## 2016-04-14 LAB — SAMPLE TO BLOOD BANK

## 2016-04-14 NOTE — Telephone Encounter (Signed)
Called pt - talked to Mr Slabaugh; informed Hgb 7.5 and Dr [name] wants her to get 2 units of blood. Called/talked to [facility]; appt scheduled.tomorrow here at [place] @ 0800 AM - husband informed.

## 2016-04-14 NOTE — Telephone Encounter (Signed)
-----   Message from [name], MD sent at 04/13/2016  6:07 PM EST ----- Holley Raring - let's get her set up for 2 units of blood this week at short stay

## 2016-04-15 ENCOUNTER — Encounter (HOSPITAL_COMMUNITY)
Admission: RE | Admit: 2016-04-15 | Discharge: 2016-04-15 | Disposition: A | Discharge status: Home / Self Care | Payer: Medicare Other | Source: Ambulatory Visit | Attending: Oncology | Admitting: Oncology

## 2016-04-15 DIAGNOSIS — I780 Hereditary hemorrhagic telangiectasia: Secondary | ICD-10-CM | POA: Diagnosis not present

## 2016-04-15 DIAGNOSIS — K31811 Angiodysplasia of stomach and duodenum with bleeding: Secondary | ICD-10-CM | POA: Diagnosis present

## 2016-04-15 DIAGNOSIS — D500 Iron deficiency anemia secondary to blood loss (chronic): Secondary | ICD-10-CM | POA: Diagnosis present

## 2016-04-15 LAB — PREPARE RBC (CROSSMATCH)

## 2016-04-15 MED ORDER — SODIUM CHLORIDE 0.9 % IV SOLN: Freq: Once | INTRAVENOUS | Status: DC

## 2016-04-15 MED ORDER — HEPARIN SOD (PORK) LOCK FLUSH 100 UNIT/ML IV SOLN
INTRAVENOUS | Status: AC
  Administered 2016-04-15: 500 [IU]
  Filled 2016-04-15: qty 5

## 2016-04-16 LAB — TYPE AND SCREEN
ABO/RH(D): A POS
Antibody Screen: NEGATIVE
UNIT DIVISION: 0
Unit division: 0

## 2016-04-16 LAB — BPAM RBC
Blood Product Expiration Date: 201803292359
Blood Product Expiration Date: 201803292359
ISSUE DATE / TIME: 201803070818
ISSUE DATE / TIME: 201803071118
Unit Type and Rh: 6200
Unit Type and Rh: 6200

## 2016-04-27 ENCOUNTER — Telehealth: Payer: Self-pay | Admitting: *Deleted

## 2016-04-27 ENCOUNTER — Ambulatory Visit (INDEPENDENT_AMBULATORY_CARE_PROVIDER_SITE_OTHER): Payer: Medicare Other | Admitting: Internal Medicine

## 2016-04-27 ENCOUNTER — Encounter: Payer: Self-pay | Admitting: Internal Medicine

## 2016-04-27 ENCOUNTER — Other Ambulatory Visit (INDEPENDENT_AMBULATORY_CARE_PROVIDER_SITE_OTHER): Payer: Medicare Other

## 2016-04-27 DIAGNOSIS — I780 Hereditary hemorrhagic telangiectasia: Secondary | ICD-10-CM

## 2016-04-27 DIAGNOSIS — R600 Localized edema: Secondary | ICD-10-CM

## 2016-04-27 DIAGNOSIS — M79604 Pain in right leg: Secondary | ICD-10-CM | POA: Diagnosis not present

## 2016-04-27 DIAGNOSIS — I10 Essential (primary) hypertension: Secondary | ICD-10-CM

## 2016-04-27 DIAGNOSIS — I872 Venous insufficiency (chronic) (peripheral): Secondary | ICD-10-CM | POA: Insufficient documentation

## 2016-04-27 DIAGNOSIS — I480 Paroxysmal atrial fibrillation: Secondary | ICD-10-CM | POA: Diagnosis not present

## 2016-04-27 DIAGNOSIS — K31819 Angiodysplasia of stomach and duodenum without bleeding: Secondary | ICD-10-CM

## 2016-04-27 DIAGNOSIS — M79605 Pain in left leg: Secondary | ICD-10-CM | POA: Diagnosis not present

## 2016-04-27 LAB — CBC WITH DIFFERENTIAL/PLATELET
BASOS ABS: 0 10*3/uL (ref 0.0–0.1)
BASOS PCT: 1 %
EOS ABS: 0.4 10*3/uL (ref 0.0–0.7)
Eosinophils Relative: 7 %
HCT: 28.3 % — ABNORMAL LOW (ref 36.0–46.0)
Hemoglobin: 9 g/dL — ABNORMAL LOW (ref 12.0–15.0)
Lymphocytes Relative: 14 %
Lymphs Abs: 0.7 10*3/uL (ref 0.7–4.0)
MCH: 30.2 pg (ref 26.0–34.0)
MCHC: 31.8 g/dL (ref 30.0–36.0)
MCV: 95 fL (ref 78.0–100.0)
Monocytes Absolute: 0.4 10*3/uL (ref 0.1–1.0)
Monocytes Relative: 9 %
NEUTROS ABS: 3.4 10*3/uL (ref 1.7–7.7)
NEUTROS PCT: 69 %
Platelets: 169 10*3/uL (ref 150–400)
RBC: 2.98 MIL/uL — ABNORMAL LOW (ref 3.87–5.11)
RDW: 16.6 % — ABNORMAL HIGH (ref 11.5–15.5)
WBC: 4.9 10*3/uL (ref 4.0–10.5)

## 2016-04-27 LAB — SAMPLE TO BLOOD BANK

## 2016-04-27 MED ORDER — FUROSEMIDE 20 MG PO TABS: 40.0000 mg | ORAL_TABLET | Freq: Every day | ORAL | 4 refills | Status: DC

## 2016-04-27 MED ORDER — MEDICAL COMPRESSION STOCKINGS MISC: 0 refills | Status: DC

## 2016-04-27 NOTE — Telephone Encounter (Signed)
Agree. Thanks

## 2016-04-27 NOTE — Progress Notes (Signed)
   CC: "My legs have been swelling for the last month."  HPI:  [name] is a 76 y.o. woman with history of hereditary hemorrhagic teleangiectasia c/b chronic anemia, CHF, pAF (not on anticoagulation due to bleeding risk), DM, and HTN who presents for evaluation of bilateral leg pain and swelling.  She presented for scheduled CBC and noted these acute complains and was scheduled with a same-day appointment.   Past Medical History:  Diagnosis Date  . CHF (congestive heart failure) ([place])   . Chronic anemia   . Chronic diastolic CHF (congestive heart failure) ([place]) 10/03/2013  . Chronic GI bleeding    [name] 11/29/2014  . Family history of anesthesia complication    "niece has a hard time coming out" (09/15/2012)  . Frequent nosebleeds    chronic  . Gastric AV malformation    [name] 11/29/2014  . GERD (gastroesophageal reflux disease)   . Heart murmur   . History of blood transfusion "several"  . History of epistaxis   . HTN (hypertension), benign 03/02/2012  . Hyperlipidemia   . Iron deficiency anemia    chronic infusions"  . Lichen planus    Both lower extremities  . 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 Pneumonia 1990's X 2  . Seizures ([place]) 09/2014  . Telangiectasia    Gastric   . 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    Review of Systems:   Review of Systems  Constitutional: Negative for chills, fever and weight loss.  Respiratory: Negative for shortness of breath and wheezing.   Cardiovascular: Positive for leg swelling. Negative for chest pain, orthopnea and PND.  Genitourinary: Negative for frequency.       No frothy or foamy urine    Physical Exam:  Vitals:   04/27/16 1321  BP: (!) 154/48  Pulse: (!) 57  Temp: 98.1 F (36.7 C)  TempSrc: Oral  SpO2: 100%  Weight: 152 lb 3.2 oz (69 kg)   Physical Exam  Constitutional: She is oriented to person, place, and time.  Neck: No JVD present.  Cardiovascular:  3/6  systolic murmur loudest at RUSB Regular rate and rhythm, normal S1S2  Pulmonary/Chest: Effort normal and breath sounds normal. No respiratory distress. She has no wheezes.  Musculoskeletal:  1+ pitting edema to knees No deformity, tenderness  Neurological: She is alert and oriented to person, place, and time.  Skin: Skin is warm and dry.  Psychiatric: She has a normal mood and affect. Her behavior is normal.     CBC Latest Ref Rng & Units 04/27/2016 04/13/2016 04/06/2016  WBC 4.0 - 10.5 K/uL 4.9 5.5 5.9  Hemoglobin 12.0 - 15.0 g/dL 9.0(L) 7.5(L) 6.0(LL)  Hematocrit 36.0 - 46.0 % 28.3(L) 24.4(L) 19.3(L)  Platelets 150 - 400 K/uL 169 171 160   Albumin 3.3 in 01/2016   Echocardiogram 06/2014 EF 65-70%, G2DD  Assessment & Plan:   See Encounters Tab for problem based charting.  Patient discussed with Dr. Lynnae January

## 2016-04-27 NOTE — Telephone Encounter (Signed)
WALK-IN Pt presents while having labwork done for dr [name] c/o bil legs with pain and swelling for at least 1 month but progressively getting worse, she desires to see someone today that might help her toward some relief. Open slot dr Inda Castle at 1315, pt placed in that slot

## 2016-04-27 NOTE — Patient Instructions (Addendum)
For your leg swelling, please take Lasix 40 mg (2 of your pills) daily for the next week, then go back to taking your regular 20 mg.  I also recommend you wear compression stockings and try to elevate your legs as much as possible.  Please come back and see [place] in 1-2 weeks to check on the swelling.  If you get any shortness of breath, dizziness, or chest pain, please call back for an earlier appointment.

## 2016-04-27 NOTE — Assessment & Plan Note (Signed)
For the past month she has noticed symmetrical lower extremity edema, which is worse at the end of the day or after she has been standing and better with elevation.  She denies orthopnea, PND, chest pain, and palpitations.  She is not very active, only walking short distances with her cane, but has not noticed any exertional dyspnea or changes in exertional capacity.  No urinary changes.  Appetite is good.  She has been taking Lasix 20 mg daily as prescribed.  A/P Elderly woman with mild symmetrical dependent edema of lower extremities, most likely due to mild venous insufficiency.  She has no other signs/symptoms concerning for CHF, and echo last year showed normal EF and G2DD.  Recent albumin was slightly low at 3.3, bo urine albumin in 06/2015, do not suspect nephrotic syndrome/proteinuria.   -Compression stockings  -Increase lasix to 40 mg daily for 1 week -F/u 2 weeks -If no improvement or additional cardiac symptoms develop, can consider repeat echo

## 2016-04-27 NOTE — Progress Notes (Signed)
Internal Medicine Clinic Attending  Case discussed with Dr. O'Sullivan at the time of the visit.  We reviewed the resident's history and exam and pertinent patient test results.  I agree with the assessment, diagnosis, and plan of care documented in the resident's note. 

## 2016-04-30 LAB — HM DIABETES EYE EXAM

## 2016-05-06 ENCOUNTER — Ambulatory Visit (INDEPENDENT_AMBULATORY_CARE_PROVIDER_SITE_OTHER): Payer: Medicare Other | Admitting: Adult Health

## 2016-05-06 ENCOUNTER — Encounter: Payer: Self-pay | Admitting: Adult Health

## 2016-05-06 VITALS — BP 126/55 | HR 56 | Ht 63.0 in | Wt 149.8 lb

## 2016-05-06 DIAGNOSIS — R413 Other amnesia: Secondary | ICD-10-CM | POA: Diagnosis not present

## 2016-05-06 DIAGNOSIS — R569 Unspecified convulsions: Secondary | ICD-10-CM

## 2016-05-06 MED ORDER — DONEPEZIL HCL 10 MG PO TABS: 10.0000 mg | ORAL_TABLET | Freq: Every day | ORAL | 11 refills | Status: DC

## 2016-05-06 MED ORDER — LEVETIRACETAM 500 MG PO TABS: 500.0000 mg | ORAL_TABLET | Freq: Two times a day (BID) | ORAL | 4 refills | Status: DC

## 2016-05-06 NOTE — Patient Instructions (Signed)
Memory score is stable Continue Namenda and Ariceopt for memory Continue Keppra for seizures If your symptoms worsen or you develop new symptoms please let us know.

## 2016-05-06 NOTE — Progress Notes (Signed)
PATIENT: Sairah Knobloch Wonder DOB: Apr 05, 1940  REASON FOR VISIT: follow up- memory, seizures HISTORY FROM: patient  HISTORY OF PRESENT ILLNESS: Ms Uher is a 76 year old female with a history of memory disturbance and seizures. She returns today for follow-up. She reports that she has been doing well. Denies any seizure events. She continues on Keppra 500 mg twice a day. She reports that her memory varies. She lives at home with her husband. He is able to complete all ADLs independently. Her husband handles all the finances. He also helps her oversee her medication and appointments. She does not operate a motor vehicle. She is able to cook without difficulty. She states that some nights she is unable to sleep well. Denies any hallucinations. She returns today for an evaluation.  HISTORY 11/07/15: Ms. Minchew is a 76 year old female with a history of memory disturbance. She returns today for follow-up. She feels that her memory has remained stable. She continues on Aricept. At the last visit she was started on Namenda. Reports that she is tolerating this well. She denies any seizure events. She remains on Keppra. She is able to complete most ADLs without assistance. She does not operate a motor vehicle. Denies any changes with her gait or balance. Denies any changes with her mood or behavior. Patient's husband is in the lobby but does not want to come to the exam room? Patient reports that she was admitted to the hospital in August due to a GI bleed. She denies any other medical history. Returns today for an evaluation.   08/07/15 (MM) : Ms. Eutsler is a 76 year old female with a history of memory disturbance. She returns today for follow-up. She states that her memory has remained stable. She is tolerating Aricept well. She lives at home with her husband. She states that Keppra does make her sleepy throughout the day. She is not had any seizure events. She does not operate a motor vehicle. She does prepare  meals without difficulty. She states that she is trying to get Meals on Wheels but is on the wait list. She returns today for an evaluation.  01/30/15 [facility]): She is more forgetful. She forgets things she says, by the time she tella her husband something she has forgotten. They live independently. Pays all the bills, not skipping bills. No accidents in the home. She is losing things. Misplacing. Forgets appointments. Difficulty with organization more. Can't get herself together. She sleeps a lot. No dementia, no alzheimers in the family.   HPI: SHERILYNN DIEU is a 75 y.o. female here as a referral from Dr. Jeanie Cooks for seizure in the setting of hyperammonemia. She has a past medical history of hypertension, congestive heart failure, diabetes, acute encephalopathy with status epilepticus, acute renal failure superimposed on stage III chronic kidney disease, urinary tract infections, hyperammonemia.  Patient here with husband. She was admitted with an episode of seizure-like activity. Husband provides assist of the information. He describes the incident. She was shaking her hands and head, trying to catch her breath. All information provided by husband. Lasted a few minutes. She was not responsive. It was on a Tuesday. Then she slept for 5 hours afterwards. Then she vomited while sleeping and then they called 911 and went to the emergency room. This is not the first time it has happened. It has happened in the past. Husband had seen the jerking of the limbs and then she would sleep a while and wake up. He says he has seen in twice  in the last year. No family history of seizures. She is much better now. Therapy is coming to her house. Not having any side effects from the medication. She has had episodes of AMS and confusion in the past.   Reviewed notes, labs and imaging from outside physicians, which showed:  MRI of the brain 08/2014 personally reviewed images and agree with following report. : 1. No  acute infarct identified. 2. Increased basal ganglia intrinsic T1 signal which can be seen with hepatic insufficiency, cirrhosis, portosystemic shunt, portal vein thrombosis, total parenteral nutrition, and is less often seen with hyperglycemia or other metabolic disturbances such as Wilson's disease. Correlation with serum ammonia levels and liver function tests is recommended. 3. Advanced but nonspecific cerebral white matter signal changes. Most commonly this is due to chronic small vessel disease. Other considerations include hypercoagulable state, vasculitis, , prior infection or demyelination.  CBC showed anemia, anemia while hospitalized 327. Hepatic function was normal. One month previous she had a GI bleed. Lactulose was started for elevated ammonia. An EEG was ordered which was concerning for frontal status epilepticus. Hyperammonemia was thought to be the driver of the seizures however she was started on Keppra. She was discharged with the diagnosis of acute encephalopathy and hyperammonemia, she was discharged on Keppra 500 mg twice daily.  Patient presented to the emergency room in late August with altered mental status and lethargy. CT of the head imaging was unremarkable patient was afebrile, no leukocytosis, ammonia was 327, BUN was 32 and creatinine was 1.38.   REVIEW OF SYSTEMS: Out of a complete 14 system review of symptoms, the patient complains only of the following symptoms, and all other reviewed systems are negative.  See history of present illness  ALLERGIES: Allergies  Allergen Reactions  . Aspirin Nausea And Vomiting    HOME MEDICATIONS: Outpatient Medications Prior to Visit  Medication Sig Dispense Refill  . AMITIZA 24 MCG capsule Take 24 mcg by mouth 2 (two) times daily.    . baclofen (LIORESAL) 10 MG tablet Take 0.5 tablets (5 mg total) by mouth 2 (two) times daily. 60 each 0  . calcium citrate-vitamin D (CITRACAL+D) 315-200 MG-UNIT tablet Take 1 tablet by  mouth 2 (two) times daily. 100 tablet 0  . cetaphil (CETAPHIL) lotion Apply 1 application topically as needed for dry skin. 236 mL 0  . diclofenac sodium (VOLTAREN) 1 % GEL Apply 2 g topically 4 (four) times daily. 100 g 1  . donepezil (ARICEPT) 10 MG tablet Take 1 tablet (10 mg total) by mouth at bedtime. 30 tablet 11  . Elastic Bandages & Supports (MEDICAL COMPRESSION STOCKINGS) MISC Wear as much as possible while awake to reduce swelling 2 each 0  . ferrous fumarate (HEMOCYTE - 106 MG FE) 325 (106 Fe) MG TABS tablet Take 1 tablet (106 mg of iron total) by mouth 2 (two) times daily. Reported on 01/30/2015 90 each 3  . glipiZIDE (GLUCOTROL) 5 MG tablet Take 0.5 tablets (2.5 mg total) by mouth daily before breakfast. 90 tablet 0  . glucose blood (ONETOUCH VERIO) test strip Check blood sugar up to 3 times a day 100 each 12  . Insulin Detemir (LEVEMIR FLEXTOUCH) 100 UNIT/ML Pen Inject 20 Units into the skin 2 (two) times daily. 15 mL 11  . KLOR-CON M20 20 MEQ tablet TAKE ONE TABLET BY MOUTH ONCE DAILY 30 tablet 0  . lactulose (CHRONULAC) 10 GM/15ML solution Take 15 mLs (10 g total) by mouth 2 (two) times daily. 473 mL  2  . levETIRAcetam (KEPPRA) 500 MG tablet TAKE ONE TABLET BY MOUTH TWICE DAILY 180 tablet 1  . memantine (NAMENDA) 10 MG tablet Take 1 tablet (10 mg total) by mouth 2 (two) times daily. 60 tablet 11  . ONETOUCH DELICA LANCETS FINE MISC Check blood sugar up to 3 times a day 100 each 12  . oxymetazoline (AFRIN) 0.05 % nasal spray Place 1 spray into both nostrils 2 (two) times daily as needed (Epistaxis). 30 mL 0  . pantoprazole (PROTONIX) 40 MG tablet Take 1 tablet (40 mg total) by mouth 2 (two) times daily. 60 tablet 1  . pramipexole (MIRAPEX) 0.125 MG tablet Take 1 tablet (0.125 mg total) by mouth daily. 90 tablet 2  . furosemide (LASIX) 20 MG tablet Take 2 tablets (40 mg total) by mouth daily. 30 tablet 4   Facility-Administered Medications Prior to Visit  Medication Dose Route  Frequency Provider Last Rate Last Dose  . 0.9 %  sodium chloride infusion  250 mL Intravenous Once [name], MD      . 0.9 %  sodium chloride infusion   Intravenous Once [name], MD      . sodium chloride flush (NS) 0.9 % injection 10 mL  10 mL Intravenous PRN [name], MD   10 mL at 08/16/15 1535    PAST MEDICAL HISTORY: Past Medical History:  Diagnosis Date  . CHF (congestive heart failure) ([place])   . Chronic anemia   . Chronic diastolic CHF (congestive heart failure) ([place]) 10/03/2013  . Chronic GI bleeding    [name] 11/29/2014  . Family history of anesthesia complication    "niece has a hard time coming out" (09/15/2012)  . Frequent nosebleeds    chronic  . Gastric AV malformation    [name] 11/29/2014  . GERD (gastroesophageal reflux disease)   . Heart murmur   . History of blood transfusion "several"  . History of epistaxis   . HTN (hypertension), benign 03/02/2012  . Hyperlipidemia   . Iron deficiency anemia    chronic infusions"  . Lichen planus    Both lower extremities  . 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 Pneumonia 1990's X 2  . Seizures ([place]) 09/2014  . Telangiectasia    Gastric   . 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    PAST SURGICAL HISTORY: Past Surgical History:  Procedure Laterality Date  . CATARACT EXTRACTION     "I think it was just one eye"  . ESOPHAGOGASTRODUODENOSCOPY  02/26/2011   Procedure: ESOPHAGOGASTRODUODENOSCOPY (EGD);  Surgeon: [name], MD;  Location: [name];  Service: Endoscopy;  Laterality: N/A;  . ESOPHAGOGASTRODUODENOSCOPY N/A 11/08/2012   Procedure: ESOPHAGOGASTRODUODENOSCOPY (EGD);  Surgeon: [name], MD;  Location: [name];  Service: Endoscopy;  Laterality: N/A;  . ESOPHAGOGASTRODUODENOSCOPY N/A 10/04/2013   Procedure: ESOPHAGOGASTRODUODENOSCOPY (EGD);  Surgeon: Winfield Cunas., MD;  Location: [name];  Service: Endoscopy;  Laterality:  N/A;  with APC on stand-by  . ESOPHAGOGASTRODUODENOSCOPY N/A 07/06/2014   Procedure: ESOPHAGOGASTRODUODENOSCOPY (EGD);  Surgeon: Clarene Essex, MD;  Location: [name];  Service: Endoscopy;  Laterality: N/A;  . ESOPHAGOGASTRODUODENOSCOPY N/A 09/05/2014   Procedure: ESOPHAGOGASTRODUODENOSCOPY (EGD);  Surgeon: Laurence Spates, MD;  Location: [name];  Service: Endoscopy;  Laterality: N/A;  APC on standby to control bleeding  . ESOPHAGOGASTRODUODENOSCOPY N/A 11/29/2014   Procedure: ESOPHAGOGASTRODUODENOSCOPY (EGD);  Surgeon: [name], MD;  Location: [facility] ENDOSCOPY;  Service: Endoscopy;  Laterality:  N/A;  . ESOPHAGOGASTRODUODENOSCOPY N/A 09/28/2015   Procedure: ESOPHAGOGASTRODUODENOSCOPY (EGD);  Surgeon: Clarene Essex, MD;  Location: [facility] ENDOSCOPY;  Service: Endoscopy;  Laterality: N/A;  . ESOPHAGOGASTRODUODENOSCOPY ENDOSCOPY  08/19/2006   with laser treatment  . HOT HEMOSTASIS N/A 07/06/2014   Procedure: HOT HEMOSTASIS (ARGON PLASMA COAGULATION/BICAP);  Surgeon: Clarene Essex, MD;  Location: [name];  Service: Endoscopy;  Laterality: N/A;  . HOT HEMOSTASIS N/A 09/28/2015   Procedure: HOT HEMOSTASIS (ARGON PLASMA COAGULATION/BICAP);  Surgeon: Clarene Essex, MD;  [facility];  Service: Endoscopy;  Laterality: N/A;  . NASAL HEMORRHAGE CONTROL     "for bleeding"   . SAVORY DILATION  02/26/2011   Procedure: SAVORY DILATION;  Surgeon: [name], MD;  Location: [name];  Service: Endoscopy;  Laterality: N/A;  c-arm needed    FAMILY HISTORY: Family History  Problem Relation Age of Onset  . Stroke Father   . Breast cancer    . Malignant hyperthermia Neg Hx   . Seizures Neg Hx     SOCIAL HISTORY: Social History   Social History  . Marital status: Married    Spouse name: [name]  . Number of children: 0  . Years of education: 9   Occupational History  . Retired Engineer, manufacturing systems Retired   Social History Main Topics  . Smoking status: Former Smoker    Packs/day: 1.00     Years: 20.00    Types: Cigarettes    Quit date: 02/10/1971  . Smokeless tobacco: Never Used     Comment: 09/15/2012 "smoked 50-60 yr ago"  . Alcohol use No  . Drug use: No  . Sexual activity: Not on file   Other Topics Concern  . Not on file   Social History Narrative   Married, lives with husband, [name].  Ambulates without assistance.     Caffeine use: none      PHYSICAL EXAM  Vitals:   05/06/16 0933  BP: (!) 126/55  Pulse: (!) 56  Weight: 149 lb 12.8 oz (67.9 kg)  Height: 5\' 3"  (1.6 m)   Body mass index is 26.54 kg/m.   MMSE - Mini Mental State Exam 05/06/2016 11/07/2015  Orientation to time 3 3  Orientation to Place 4 4  Registration 3 3  Attention/ Calculation 0 2  Recall 1 1  Language- name 2 objects 2 2  Language- repeat 1 0  Language- follow 3 step command 3 3  Language- read & follow direction 1 1  Write a sentence 1 1  Copy design 0 0  Total score 19 20     Generalized: Well developed, in no acute distress   Neurological examination  Mentation: Alert oriented to time, place, history taking. Follows all commands speech and language fluent Cranial nerve II-XII: Pupils were equal round reactive to light. Extraocular movements were full, visual field were full on confrontational test. Facial sensation and strength were normal. Uvula tongue midline. Head turning and shoulder shrug  were normal and symmetric. Motor: The motor testing reveals 5 over 5 strength of all 4 extremities. Good symmetric motor tone is noted throughout.  Sensory: Sensory testing is intact to soft touch on all 4 extremities. No evidence of extinction is noted.  Coordination: Cerebellar testing reveals good finger-nose-finger and heel-to-shin bilaterally.  Gait and station: Gait is normal.  Reflexes: Deep tendon reflexes are symmetric and normal bilaterally.   DIAGNOSTIC DATA (LABS, IMAGING, TESTING) - I reviewed patient records, labs, notes, testing and imaging myself where  available.  Lab  Results  Component Value Date   WBC 4.9 04/27/2016   HGB 9.0 (L) 04/27/2016   HCT 28.3 (L) 04/27/2016   MCV 95.0 04/27/2016   PLT 169 04/27/2016      Component Value Date/Time   NA 141 03/03/2016 0501   NA 144 04/04/2014 1019   K 3.5 03/03/2016 0501   K 4.5 04/04/2014 1019   CL 107 03/03/2016 0501   CL 111 (H) 12/28/2011 0825   CO2 26 03/03/2016 0501   CO2 26 04/04/2014 1019   GLUCOSE 239 (H) 03/03/2016 0501   GLUCOSE 156 (H) 04/04/2014 1019   GLUCOSE 152 (H) 12/28/2011 0825   BUN 10 03/03/2016 0501   BUN 16.9 04/04/2014 1019   CREATININE 1.14 (H) 03/03/2016 0501   CREATININE 0.92 01/28/2015 0811   CREATININE 0.9 04/04/2014 1019   CALCIUM 8.7 (L) 03/03/2016 0501   CALCIUM 9.3 04/04/2014 1019   PROT 6.0 (L) 01/14/2016 1025   PROT 6.4 04/04/2014 1019   ALBUMIN 3.3 (L) 01/14/2016 1025   ALBUMIN 3.3 (L) 04/04/2014 1019   AST 21 01/14/2016 1025   AST 30 04/04/2014 1019   ALT 15 01/14/2016 1025   ALT 25 04/04/2014 1019   ALKPHOS 71 01/14/2016 1025   ALKPHOS 140 04/04/2014 1019   BILITOT 1.0 01/14/2016 1025   BILITOT 0.43 04/04/2014 1019   GFRNONAA 46 (L) 03/03/2016 0501   GFRAA 53 (L) 03/03/2016 0501   Lab Results  Component Value Date   CHOL 141 11/29/2014   HDL 31 (L) 11/29/2014   LDLCALC 82 11/29/2014   TRIG 141 11/29/2014   CHOLHDL 4.5 11/29/2014   Lab Results  Component Value Date   HGBA1C 7.1 06/20/2015   Lab Results  Component Value Date   HQIONGEX52 841 11/29/2014   Lab Results  Component Value Date   TSH 1.730 01/30/2015      ASSESSMENT AND PLAN 76 y.o. 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 here with:  1. Seizures 2. Memory disturbance  His memory score has remained stable. She will continue on Aricept and Namenda. The patient has not had any seizure events. She will continue on Keppra 500 mg twice a day. Advised that if her symptoms worsen or she develops new symptoms she should let us know. She will follow-up in 6 months or sooner if needed.    Ward Givens, MSN, NP-C 05/06/2016, 9:50 AM [facility] [address], [place], [place] [address] [phone]

## 2016-05-11 ENCOUNTER — Other Ambulatory Visit: Payer: Medicare Other

## 2016-05-11 ENCOUNTER — Encounter: Payer: Self-pay | Admitting: Internal Medicine

## 2016-05-11 ENCOUNTER — Ambulatory Visit (INDEPENDENT_AMBULATORY_CARE_PROVIDER_SITE_OTHER): Payer: Medicare Other | Admitting: Internal Medicine

## 2016-05-11 VITALS — BP 137/44 | HR 63 | Temp 98.0°F | Ht 63.0 in | Wt 149.1 lb

## 2016-05-11 DIAGNOSIS — I780 Hereditary hemorrhagic telangiectasia: Secondary | ICD-10-CM

## 2016-05-11 DIAGNOSIS — R600 Localized edema: Secondary | ICD-10-CM | POA: Diagnosis not present

## 2016-05-11 DIAGNOSIS — Q2733 Arteriovenous malformation of digestive system vessel: Secondary | ICD-10-CM | POA: Diagnosis not present

## 2016-05-11 DIAGNOSIS — Z87891 Personal history of nicotine dependence: Secondary | ICD-10-CM

## 2016-05-11 DIAGNOSIS — I5032 Chronic diastolic (congestive) heart failure: Secondary | ICD-10-CM | POA: Diagnosis not present

## 2016-05-11 LAB — CBC WITH DIFFERENTIAL/PLATELET
BASOS ABS: 0 10*3/uL (ref 0.0–0.1)
BASOS PCT: 0 %
EOS ABS: 0.4 10*3/uL (ref 0.0–0.7)
Eosinophils Relative: 8 %
HEMATOCRIT: 27 % — AB (ref 36.0–46.0)
HEMOGLOBIN: 8.7 g/dL — AB (ref 12.0–15.0)
Lymphocytes Relative: 11 %
Lymphs Abs: 0.5 10*3/uL — ABNORMAL LOW (ref 0.7–4.0)
MCH: 31 pg (ref 26.0–34.0)
MCHC: 32.2 g/dL (ref 30.0–36.0)
MCV: 96.1 fL (ref 78.0–100.0)
MONOS PCT: 9 %
Monocytes Absolute: 0.4 10*3/uL (ref 0.1–1.0)
NEUTROS ABS: 3.3 10*3/uL (ref 1.7–7.7)
NEUTROS PCT: 72 %
Platelets: 172 10*3/uL (ref 150–400)
RBC: 2.81 MIL/uL — ABNORMAL LOW (ref 3.87–5.11)
RDW: 15.1 % (ref 11.5–15.5)
WBC: 4.6 10*3/uL (ref 4.0–10.5)

## 2016-05-11 MED ORDER — FUROSEMIDE 20 MG PO TABS: 20.0000 mg | ORAL_TABLET | Freq: Every day | ORAL | 2 refills | Status: DC

## 2016-05-11 NOTE — Assessment & Plan Note (Signed)
Check CBC today>> Hgb 8.7. Dr Darnell Level aware. Patient to get blood drawn next week.

## 2016-05-11 NOTE — Patient Instructions (Signed)
General Instructions: - Decrease Lasix to 1 pill daily  - Remember to wear your compression stockings as much as possible and to keep your legs propped up  Please bring your medicines with you each time you come to clinic.  Medicines may include prescription medications, over-the-counter medications, herbal remedies, eye drops, vitamins, or other pills.   Progress Toward Treatment Goals:  No flowsheet data found.  Self Care Goals & Plans:  Self Care Goal 08/22/2015  Manage my medications take my medicines as prescribed; bring my medications to every visit; refill my medications on time  Monitor my health keep track of my blood glucose; bring my glucose meter and log to each visit  Eat healthy foods drink diet soda or water instead of juice or soda; eat more vegetables; eat foods that are low in salt; eat baked foods instead of fried foods; eat fruit for snacks and desserts  Be physically active find an activity I enjoy  Prevent falls -    No flowsheet data found.   Care Management & Community Referrals:  No flowsheet data found.

## 2016-05-11 NOTE — Progress Notes (Signed)
   CC: Follow up of bilateral lower extremity edema   HPI:  [name] is a 76 y.o. woman with PMHx as noted below who presents today for follow up of her bilateral lower extremity edema.  She reports she has been taking Lasix 40 mg daily. She has noticed a mild improvement in the swelling of her feet and ankles. She reports the swelling is worst when she lets her legs hang down. Her swelling improves when elevating her legs and wearing her compression stockings. She did not wear her compression stockings today but notes she wears them every day at home. She denies any shortness of breath, orthopnea, or chest pain.   She is also scheduled to get routine blood work for her hx of chronic blood loss due to gastric AVMs. She denies any recent bloody stools.     Past Medical History:  Diagnosis Date  . CHF (congestive heart failure) ([place])   . Chronic anemia   . Chronic diastolic CHF (congestive heart failure) ([place]) 10/03/2013  . Chronic GI bleeding    [name] 11/29/2014  . Family history of anesthesia complication    "niece has a hard time coming out" (09/15/2012)  . Frequent nosebleeds    chronic  . Gastric AV malformation    [name] 11/29/2014  . GERD (gastroesophageal reflux disease)   . Heart murmur   . History of blood transfusion "several"  . History of epistaxis   . HTN (hypertension), benign 03/02/2012  . Hyperlipidemia   . Iron deficiency anemia    chronic infusions"  . Lichen planus    Both lower extremities  . 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 Pneumonia 1990's X 2  . Seizures ([place]) 09/2014  . Telangiectasia    Gastric   . 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    Review of Systems:   General: Denies fever, chills, night sweats, changes in weight, changes in appetite HEENT: Denies headaches, ear pain, changes in vision, rhinorrhea, sore throat CV: Denies palpitations Pulm: Denies cough, wheezing GI: Denies abdominal pain,  nausea, vomiting, diarrhea, constipation, melena, hematochezia GU: Denies dysuria, hematuria, frequency Msk: Denies muscle cramps, joint pains Neuro: Denies weakness, numbness, tingling Skin: Denies rashes, bruising Psych: Denies depression, anxiety, hallucinations  Physical Exam:  Vitals:   05/11/16 1036  BP: (!) 137/44  Pulse: 63  Temp: 98 F (36.7 C)  TempSrc: Oral  SpO2: 99%  Weight: 149 lb 1.6 oz (67.6 kg)  Height: 5\' 3"  (1.6 m)   General: Elderly woman sitting up in chair, NAD CV: RRR, 3/6 systolic murmur heard best at RUSB Pulm: CTA bilaterally, breaths non-labored Ext: warm, 1+ pitting edema to mid shins  Assessment & Plan:   See Encounters Tab for problem based charting.  Patient discussed with Dr. Angelia Mould

## 2016-05-11 NOTE — Assessment & Plan Note (Addendum)
Her peripheral edema has not changed significantly with the increased dose of Lasix. Her peripheral edema is likely due to chronic venous insufficiency. We discussed the importance of her wearing her compression stockings daily and elevating her legs when possible. Will decrease her Lasix to 20 mg daily and obtain a bmet today. Would not obtain a repeat echo at this time as she has no other symptoms/signs to suggest worsening of her diastolic heart failure.

## 2016-05-12 ENCOUNTER — Telehealth: Payer: Self-pay | Admitting: *Deleted

## 2016-05-12 LAB — BMP8+ANION GAP
ANION GAP: 10 mmol/L (ref 10.0–18.0)
BUN/Creatinine Ratio: 16 (ref 12–28)
BUN: 14 mg/dL (ref 8–27)
CALCIUM: 8.9 mg/dL (ref 8.7–10.3)
CO2: 30 mmol/L — AB (ref 18–29)
CREATININE: 0.89 mg/dL (ref 0.57–1.00)
Chloride: 101 mmol/L (ref 96–106)
GFR calc Af Amer: 73 mL/min/{1.73_m2} (ref >59)
GFR calc non Af Amer: 63 mL/min/{1.73_m2} (ref >59)
Glucose: 319 mg/dL — ABNORMAL HIGH (ref 65–99)
POTASSIUM: 3.5 mmol/L (ref 3.5–5.2)
SODIUM: 141 mmol/L (ref 134–144)

## 2016-05-12 LAB — SAMPLE TO BLOOD BANK

## 2016-05-12 NOTE — Telephone Encounter (Signed)
-----   Message from [name], MD sent at 05/11/2016  1:55 PM EDT ----- Call pt: blood count OK. She will probably need blood again next week.

## 2016-05-13 NOTE — Telephone Encounter (Signed)
Called pt - talked to her husband; informed blood count ok per Dr [name]. Dropped slightly from 9.0 to 8.7 so might need transfusion next time - stated ok and thanks for calling.

## 2016-05-14 ENCOUNTER — Encounter: Payer: Medicare Other | Admitting: Internal Medicine

## 2016-05-15 NOTE — Progress Notes (Signed)
Internal Medicine Clinic Attending  Case discussed with Dr. Rivet soon after the resident saw the patient.  We reviewed the resident's history and exam and pertinent patient test results.  I agree with the assessment, diagnosis, and plan of care documented in the resident's note.  

## 2016-05-21 ENCOUNTER — Ambulatory Visit (INDEPENDENT_AMBULATORY_CARE_PROVIDER_SITE_OTHER): Payer: Medicare Other | Admitting: Internal Medicine

## 2016-05-21 ENCOUNTER — Encounter: Payer: Self-pay | Admitting: Internal Medicine

## 2016-05-21 ENCOUNTER — Other Ambulatory Visit: Payer: Self-pay | Admitting: Oncology

## 2016-05-21 ENCOUNTER — Other Ambulatory Visit (HOSPITAL_COMMUNITY): Payer: Self-pay | Admitting: *Deleted

## 2016-05-21 ENCOUNTER — Telehealth: Payer: Self-pay

## 2016-05-21 VITALS — BP 130/33 | HR 66 | Temp 98.4°F | Resp 18 | Wt 146.0 lb

## 2016-05-21 DIAGNOSIS — N289 Disorder of kidney and ureter, unspecified: Secondary | ICD-10-CM

## 2016-05-21 DIAGNOSIS — I780 Hereditary hemorrhagic telangiectasia: Secondary | ICD-10-CM

## 2016-05-21 DIAGNOSIS — D500 Iron deficiency anemia secondary to blood loss (chronic): Secondary | ICD-10-CM

## 2016-05-21 DIAGNOSIS — Z794 Long term (current) use of insulin: Secondary | ICD-10-CM | POA: Diagnosis not present

## 2016-05-21 DIAGNOSIS — E1121 Type 2 diabetes mellitus with diabetic nephropathy: Secondary | ICD-10-CM

## 2016-05-21 DIAGNOSIS — E1129 Type 2 diabetes mellitus with other diabetic kidney complication: Secondary | ICD-10-CM

## 2016-05-21 DIAGNOSIS — E1165 Type 2 diabetes mellitus with hyperglycemia: Secondary | ICD-10-CM | POA: Diagnosis not present

## 2016-05-21 DIAGNOSIS — K31819 Angiodysplasia of stomach and duodenum without bleeding: Secondary | ICD-10-CM

## 2016-05-21 DIAGNOSIS — K31811 Angiodysplasia of stomach and duodenum with bleeding: Secondary | ICD-10-CM

## 2016-05-21 LAB — POCT URINALYSIS DIPSTICK
Bilirubin, UA: NEGATIVE
Glucose, UA: 500
Ketones, UA: NEGATIVE
Leukocytes, UA: NEGATIVE
Nitrite, UA: NEGATIVE
Protein, UA: NEGATIVE
RBC UA: NEGATIVE
SPEC GRAV UA: 1.01 (ref 1.010–1.025)
UROBILINOGEN UA: 0.2 U/dL
pH, UA: 5 (ref 5.0–8.0)

## 2016-05-21 LAB — CBC WITH DIFFERENTIAL/PLATELET
Basophils Absolute: 0 10*3/uL (ref 0.0–0.1)
Basophils Relative: 1 %
EOS PCT: 6 %
Eosinophils Absolute: 0.3 10*3/uL (ref 0.0–0.7)
HCT: 27.1 % — ABNORMAL LOW (ref 36.0–46.0)
Hemoglobin: 8.5 g/dL — ABNORMAL LOW (ref 12.0–15.0)
LYMPHS ABS: 0.6 10*3/uL — AB (ref 0.7–4.0)
LYMPHS PCT: 13 %
MCH: 30.6 pg (ref 26.0–34.0)
MCHC: 31.4 g/dL (ref 30.0–36.0)
MCV: 97.5 fL (ref 78.0–100.0)
MONO ABS: 0.4 10*3/uL (ref 0.1–1.0)
MONOS PCT: 10 %
Neutro Abs: 3 10*3/uL (ref 1.7–7.7)
Neutrophils Relative %: 70 %
PLATELETS: 217 10*3/uL (ref 150–400)
RBC: 2.78 MIL/uL — AB (ref 3.87–5.11)
RDW: 13.9 % (ref 11.5–15.5)
WBC: 4.3 10*3/uL (ref 4.0–10.5)

## 2016-05-21 LAB — TYPE AND SCREEN
ABO/RH(D): A POS
Antibody Screen: NEGATIVE

## 2016-05-21 LAB — FERRITIN: Ferritin: 8 ng/mL — ABNORMAL LOW (ref 11–307)

## 2016-05-21 LAB — GLUCOSE, CAPILLARY: Glucose-Capillary: 578 mg/dL (ref 65–99)

## 2016-05-21 LAB — BASIC METABOLIC PANEL
Anion gap: 7 (ref 5–15)
BUN: 14 mg/dL (ref 6–20)
CHLORIDE: 102 mmol/L (ref 101–111)
CO2: 29 mmol/L (ref 22–32)
CREATININE: 1.12 mg/dL — AB (ref 0.44–1.00)
Calcium: 9.5 mg/dL (ref 8.9–10.3)
GFR calc Af Amer: 54 mL/min — ABNORMAL LOW (ref >60)
GFR calc non Af Amer: 46 mL/min — ABNORMAL LOW (ref >60)
Glucose, Bld: 600 mg/dL (ref 65–99)
Potassium: 4.5 mmol/L (ref 3.5–5.1)
Sodium: 138 mmol/L (ref 135–145)

## 2016-05-21 LAB — POCT GLYCOSYLATED HEMOGLOBIN (HGB A1C): Hemoglobin A1C: 6.6

## 2016-05-21 MED ORDER — GLIPIZIDE ER 5 MG PO TB24: 5.0000 mg | ORAL_TABLET | Freq: Every day | ORAL | 0 refills | Status: DC

## 2016-05-21 MED ORDER — INSULIN DETEMIR 100 UNIT/ML FLEXPEN: 22.0000 [IU] | PEN_INJECTOR | Freq: Two times a day (BID) | SUBCUTANEOUS | 11 refills | Status: DC

## 2016-05-21 MED ORDER — INSULIN ASPART 100 UNIT/ML ~~LOC~~ SOLN
10.0000 [IU] | Freq: Once | SUBCUTANEOUS | Status: AC
  Administered 2016-05-21: 10 [IU] via SUBCUTANEOUS

## 2016-05-21 NOTE — Telephone Encounter (Signed)
Critical Lab value: Glucose 600

## 2016-05-21 NOTE — Progress Notes (Signed)
CC: here for DM and anemia follow up and hyperglycemia  HPI:  [name] is a 76 y.o. woman with a past medical history listed below here today for follow up of her DM and found to be hyperglycemic to 578 on capillary glucose.  Her STAT BMET showed normal electrolytes, bicarb 29, anion gap 7, creatinine 1.12, and glucose > 600.  She reports sugars running high at home (300-500s) despite using Levemir 20 units BID.  She is unsure whether or not she has been taking Glipizide, but her husband confirmed that she is.  She reports symptoms of polyuria, polydipsia, and some mild nausea.  No vomiting or abdominal pain.  No headaches or noted confusion.  She brought 2 meters with her today and reports using both.  Her CBGs according to meter are averaging approximately 320 and 8.5% of readings within target glucose range.  She has no hypoglycemic readings and readings are consistently high in the morning and evening.  For details of today's visit and the status of her chronic medical issues please refer to the assessment and plan.   Past Medical History:  Diagnosis Date  . CHF (congestive heart failure) ([place])   . Chronic anemia   . Chronic diastolic CHF (congestive heart failure) ([place]) 10/03/2013  . Chronic GI bleeding    [name] 11/29/2014  . Family history of anesthesia complication    "niece has a hard time coming out" (09/15/2012)  . Frequent nosebleeds    chronic  . Gastric AV malformation    [name] 11/29/2014  . GERD (gastroesophageal reflux disease)   . Heart murmur   . History of blood transfusion "several"  . History of epistaxis   . HTN (hypertension), benign 03/02/2012  . Hyperlipidemia   . Iron deficiency anemia    chronic infusions"  . Lichen planus    Both lower extremities  . 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 Pneumonia 1990's X 2  . Seizures ([place]) 09/2014  . Telangiectasia    Gastric   . 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     Review of Systems:  Please see pertinent ROS reviewed in HPI and problem based charting.   Physical Exam:  Vitals:   05/21/16 1514  BP: (!) 130/33  Pulse: 66  Resp: 18  Temp: 98.4 F (36.9 C)  TempSrc: Oral  SpO2: 99%  Weight: 146 lb (66.2 kg)   Physical Exam  Constitutional: She is oriented to person, place, and time.  Pleasant, elderly woman, sitting in wheelchair, no distress.  HENT:  Head: Normocephalic and atraumatic.  Mouth/Throat: Oropharynx is clear and moist.  Eyes: Conjunctivae and EOM are normal.  Cardiovascular: Normal rate and regular rhythm.   Murmur heard. 3/6 systolic murmur  Pulmonary/Chest: Effort normal and breath sounds normal. No respiratory distress. She has no wheezes. She has no rales.  Musculoskeletal: She exhibits edema.  1+ edema bilaterally to mid-shin  Neurological: She is alert and oriented to person, place, and time.  Skin: Skin is warm and dry.     Assessment & Plan:   See Encounters Tab for problem based charting.  Patient discussed with Dr. Dareen Piano .  HHT (hereditary hemorrhagic telangiectasia) ([name] Assessment/Plan: HHT with no evidence of recent bleeding.  Stat CBC obtained at request of her hematologist, Dr. [name].  Hemoglobin stable at 8.5.  She is scheduled for IV iron infusion tomorrow morning at 8am.  She will return to clinic next week for  repeat labs.  DM (diabetes mellitus), type 2, uncontrolled, with renal complications ([place]) Lab Results  Component Value Date   HGBA1C 6.6 05/21/2016    Assessment: Uncontrolled DM despite acceptable A1c.  Given her frequent need for blood transfusions, the accuracy of her A1c is questionable and her average CBGs are likely more reflective of her glycemic control.  She is currently on Glipizide 2.'5mg'$  with breakfast and Levemir 20units BID.  Previously she has been on metformin (discontinued due to GI upset, weight loss) and her Levemir used to be dosed at 30units BID but this was  decreased in March 2017 due to some lows noted on her meter when she met with Debera Lat.  She has also been reluctant to start Novolog with meals to address post-prandial high glucose because of having to keep a strict schedule and having to eat shortly after giving it.  She also had difficulty drawing up the appropriate amount of her own insulin during an appointment with DM educator and was thus changed to the .  She brought 2 meters in today that show an average around 320 with no lows.  Plan: - will discontinue glipizide IR 2.'5mg'$  daily with breakfast  - will start glipizide XL '5mg'$  daily - increase Levemir to 22 units BID - RTC 1 week to assess glycemic control and consider titrating up her Levemir.  Hyperglycemia due to type 2 diabetes mellitus ([place]) Assessment: She was found to be hyperglycemic to 578 on capillary glucose.  Her STAT BMET showed normal electrolytes, bicarb 29, anion gap 7, creatinine 1.12, and glucose > 600.  Urine is without ketones.  She reports sugars running high at home (300-500s) despite using Levemir 20 units BID.  She is unsure whether or not she has been taking Glipizide, but we were able to confirm with her husband that she is.  She reports symptoms of polyuria, polydipsia, and some mild nausea.  No vomiting or abdominal pain.  No headaches or noted confusion.  She brought 2 meters with her today and reports using both.  Her CBGs according to meter are averaging approximately 320 and 8.5% of readings within target glucose range.  She has no hypoglycemic readings and readings are consistently high in the morning and evening.  Plan: - fortunately, no evidence of HHS/DKA based on labs and she is essentially asymptomatic - given 10 units Novolog in clinic - encouraged adequate hydration - RTC 1 week and instructed to call sooner if needed or go to ED if symptoms worsen - rest of plan noted under DM problem list

## 2016-05-21 NOTE — Patient Instructions (Signed)
Thank you for coming to see me today. It was a pleasure. Today we talked about:   Diabetes: - I have increased your Levemir to 22 units twice per day. - I have added a new medication: GLIPIZIDE XL 5mg  once per day.  This is different than your old Glipizide prescription.  Please discard your old medication and start taking the new one. - Continue checking your blood sugars.  Please go to short stay tomorrow at [facility] tomorrow for IV iron.    Come here to check your CBC in 1 week.  Please follow-up with [place] in 1 week. For diabetes follow up.  If you have any questions or concerns, please do not hesitate to call the office at [phone].  Take Care,   [name], DO

## 2016-05-21 NOTE — Assessment & Plan Note (Signed)
Lab Results  Component Value Date   HGBA1C 6.6 05/21/2016    Assessment: Uncontrolled DM despite acceptable A1c.  Given her frequent need for blood transfusions, the accuracy of her A1c is questionable and her average CBGs are likely more reflective of her glycemic control.  She is currently on Glipizide 2.43m with breakfast and Levemir 20units BID.  Previously she has been on metformin (discontinued due to GI upset, weight loss) and her Levemir used to be dosed at 30units BID but this was decreased in March 2017 due to some lows noted on her meter when she met with DDebera Lat  She has also been reluctant to start Novolog with meals to address post-prandial high glucose because of having to keep a strict schedule and having to eat shortly after giving it.  She also had difficulty drawing up the appropriate amount of her own insulin during an appointment with DM educator and was thus changed to the [place]  She brought 2 meters in today that show an average around 320 with no lows.  Plan: - will discontinue glipizide IR 2.586mdaily with breakfast  - will start glipizide XL 80m21maily - increase Levemir to 22 units BID - RTC 1 week to assess glycemic control and consider titrating up her Levemir.

## 2016-05-21 NOTE — Assessment & Plan Note (Signed)
Assessment: She was found to be hyperglycemic to 578 on capillary glucose.  Her STAT BMET showed normal electrolytes, bicarb 29, anion gap 7, creatinine 1.12, and glucose > 600.  Urine is without ketones.  She reports sugars running high at home (300-500s) despite using Levemir 20 units BID.  She is unsure whether or not she has been taking Glipizide, but we were able to confirm with her husband that she is.  She reports symptoms of polyuria, polydipsia, and some mild nausea.  No vomiting or abdominal pain.  No headaches or noted confusion.  She brought 2 meters with her today and reports using both.  Her CBGs according to meter are averaging approximately 320 and 8.5% of readings within target glucose range.  She has no hypoglycemic readings and readings are consistently high in the morning and evening.  Plan: - fortunately, no evidence of HHS/DKA based on labs and she is essentially asymptomatic - given 10 units Novolog in clinic - encouraged adequate hydration - RTC 1 week and instructed to call sooner if needed or go to ED if symptoms worsen - rest of plan noted under DM problem list

## 2016-05-21 NOTE — Assessment & Plan Note (Signed)
Assessment/Plan: HHT with no evidence of recent bleeding.  Stat CBC obtained at request of her hematologist, Dr. [name].  Hemoglobin stable at 8.5.  She is scheduled for IV iron infusion tomorrow morning at 8am.  She will return to clinic next week for repeat labs.

## 2016-05-22 ENCOUNTER — Encounter (HOSPITAL_COMMUNITY): Payer: Medicare Other

## 2016-05-25 ENCOUNTER — Other Ambulatory Visit: Payer: Medicare Other

## 2016-05-26 ENCOUNTER — Telehealth: Payer: Self-pay | Admitting: *Deleted

## 2016-05-26 NOTE — Progress Notes (Signed)
Internal Medicine Clinic Attending  Case discussed with Dr. Wallace at the time of the visit.  We reviewed the resident's history and exam and pertinent patient test results.  I agree with the assessment, diagnosis, and plan of care documented in the resident's note.  

## 2016-05-26 NOTE — Telephone Encounter (Signed)
Talked to Dr Darnell Level about pt coming in for labs- has appt in [place] [facility] on Thursday; Dr Darnell Level prefers pt to come in tomorrow incase pt will need transfusion. Talked to pt's husband - stated he can bring pt in tomorrow @ 1345 PM .

## 2016-05-27 ENCOUNTER — Ambulatory Visit (INDEPENDENT_AMBULATORY_CARE_PROVIDER_SITE_OTHER): Payer: Medicare Other | Admitting: Pulmonary Disease

## 2016-05-27 VITALS — BP 137/50 | HR 68 | Temp 98.2°F | Wt 141.4 lb

## 2016-05-27 DIAGNOSIS — R195 Other fecal abnormalities: Secondary | ICD-10-CM | POA: Diagnosis not present

## 2016-05-27 DIAGNOSIS — I780 Hereditary hemorrhagic telangiectasia: Secondary | ICD-10-CM

## 2016-05-27 DIAGNOSIS — N289 Disorder of kidney and ureter, unspecified: Secondary | ICD-10-CM

## 2016-05-27 DIAGNOSIS — Z8719 Personal history of other diseases of the digestive system: Secondary | ICD-10-CM | POA: Diagnosis not present

## 2016-05-27 DIAGNOSIS — Z87891 Personal history of nicotine dependence: Secondary | ICD-10-CM | POA: Diagnosis not present

## 2016-05-27 DIAGNOSIS — E1165 Type 2 diabetes mellitus with hyperglycemia: Secondary | ICD-10-CM

## 2016-05-27 DIAGNOSIS — Z7189 Other specified counseling: Secondary | ICD-10-CM

## 2016-05-27 DIAGNOSIS — E1129 Type 2 diabetes mellitus with other diabetic kidney complication: Secondary | ICD-10-CM | POA: Diagnosis not present

## 2016-05-27 DIAGNOSIS — E1121 Type 2 diabetes mellitus with diabetic nephropathy: Secondary | ICD-10-CM

## 2016-05-27 DIAGNOSIS — Z794 Long term (current) use of insulin: Secondary | ICD-10-CM

## 2016-05-27 LAB — CBC
HCT: 28.7 % — ABNORMAL LOW (ref 36.0–46.0)
Hemoglobin: 9 g/dL — ABNORMAL LOW (ref 12.0–15.0)
MCH: 29.8 pg (ref 26.0–34.0)
MCHC: 31.4 g/dL (ref 30.0–36.0)
MCV: 95 fL (ref 78.0–100.0)
PLATELETS: 234 10*3/uL (ref 150–400)
RBC: 3.02 MIL/uL — ABNORMAL LOW (ref 3.87–5.11)
RDW: 13.2 % (ref 11.5–15.5)
WBC: 4.2 10*3/uL (ref 4.0–10.5)

## 2016-05-27 NOTE — Patient Instructions (Signed)
Your blood count has improved. Keep taking your iron pill as prescribed. Follow up in 3-4 weeks or sooner as needed.

## 2016-05-27 NOTE — Progress Notes (Signed)
   CC: chronic anemia  HPI:  [name] is a 76 y.o. woman with history as noted below here for follow up of her hx of gastric AVM and chronic anemia.  She was last seen in clinic on 05/21/2016. She was switched from glipizide IR to XL and increased Levemir to 22units BID. Hgb was noted to be 8.5.  Her fasting blood glucose this morning was in the 90s. The evening before it was 203. The morning prior was 192. Denies any episodes of hypoglycemia.   Denies hematochezia. She has dark stools from taking iron. No nausea, vomiting, abdominal pain. She reports a lot improvement in her bilateral LE edema.   Past Medical History:  Diagnosis Date  . CHF (congestive heart failure) ([place])   . Chronic anemia   . Chronic diastolic CHF (congestive heart failure) ([place]) 10/03/2013  . Chronic GI bleeding    [name] 11/29/2014  . Family history of anesthesia complication    "niece has a hard time coming out" (09/15/2012)  . Frequent nosebleeds    chronic  . Gastric AV malformation    [name] 11/29/2014  . GERD (gastroesophageal reflux disease)   . Heart murmur   . History of blood transfusion "several"  . History of epistaxis   . HTN (hypertension), benign 03/02/2012  . Hyperlipidemia   . Iron deficiency anemia    chronic infusions"  . Lichen planus    Both lower extremities  . 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 Pneumonia 1990's X 2  . Seizures ([place]) 09/2014  . Telangiectasia    Gastric   . 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    Review of Systems:   No fevers or chills No chest pain No dyspnea  Physical Exam:  Vitals:   05/27/16 1353  BP: (!) 137/50  Pulse: 68  Temp: 98.2 F (36.8 C)  TempSrc: Oral  Weight: 141 lb 6.4 oz (64.1 kg)   General Apperance: NAD, elderly woman sitting in wheelchair HEENT: Normocephalic, atraumatic, anicteric sclera Neck: Supple, trachea midline Lungs: Clear to auscultation bilaterally. No wheezes, rhonchi  or rales. Breathing comfortably on room air Heart: Regular rate and rhythm, +systolic murmur heard best in LUSB, no rub/gallop Abdomen: Soft, nontender, nondistended, no rebound/guarding Extremities: Warm and well perfused, no edema Neurologic: Alert and interactive. No gross deficits.  Assessment & Plan:   See Encounters Tab for problem based charting.  Patient discussed with Dr. Lynnae January

## 2016-05-28 ENCOUNTER — Ambulatory Visit: Payer: Medicare Other

## 2016-05-28 NOTE — Assessment & Plan Note (Addendum)
Assessment: Her control sounds to be improving per her report of her fasting glucose of 192 yesterday and 90s this morning.  Plan: Continue Levemir 22u BID and glipzide XR 5mg  daily Follow up in 4-6 weeks

## 2016-05-28 NOTE — Assessment & Plan Note (Signed)
Assessment: Denies any symptoms concerning for GI bleed. Hgb increased to 9 from 8.5.  Plan: Follow up with Dr. [name] as scheduled.

## 2016-05-28 NOTE — Progress Notes (Signed)
Internal Medicine Clinic Attending  Case discussed with Dr. Krall at the time of the visit.  We reviewed the resident's history and exam and pertinent patient test results.  I agree with the assessment, diagnosis, and plan of care documented in the resident's note.  

## 2016-06-01 ENCOUNTER — Telehealth: Payer: Self-pay | Admitting: *Deleted

## 2016-06-01 ENCOUNTER — Ambulatory Visit (INDEPENDENT_AMBULATORY_CARE_PROVIDER_SITE_OTHER): Payer: Medicare Other | Admitting: Oncology

## 2016-06-01 VITALS — BP 120/38 | HR 60 | Temp 97.8°F | Ht 61.0 in | Wt 142.4 lb

## 2016-06-01 DIAGNOSIS — R011 Cardiac murmur, unspecified: Secondary | ICD-10-CM

## 2016-06-01 DIAGNOSIS — K08409 Partial loss of teeth, unspecified cause, unspecified class: Secondary | ICD-10-CM

## 2016-06-01 DIAGNOSIS — Z794 Long term (current) use of insulin: Secondary | ICD-10-CM

## 2016-06-01 DIAGNOSIS — M7989 Other specified soft tissue disorders: Secondary | ICD-10-CM

## 2016-06-01 DIAGNOSIS — I780 Hereditary hemorrhagic telangiectasia: Secondary | ICD-10-CM | POA: Diagnosis not present

## 2016-06-01 DIAGNOSIS — M79605 Pain in left leg: Secondary | ICD-10-CM

## 2016-06-01 DIAGNOSIS — K31811 Angiodysplasia of stomach and duodenum with bleeding: Secondary | ICD-10-CM

## 2016-06-01 DIAGNOSIS — Z886 Allergy status to analgesic agent status: Secondary | ICD-10-CM | POA: Diagnosis not present

## 2016-06-01 DIAGNOSIS — K31819 Angiodysplasia of stomach and duodenum without bleeding: Secondary | ICD-10-CM

## 2016-06-01 DIAGNOSIS — E119 Type 2 diabetes mellitus without complications: Secondary | ICD-10-CM

## 2016-06-01 DIAGNOSIS — M79604 Pain in right leg: Secondary | ICD-10-CM

## 2016-06-01 LAB — CBC WITH DIFFERENTIAL/PLATELET
Basophils Absolute: 0 10*3/uL (ref 0.0–0.1)
Basophils Relative: 1 %
EOS PCT: 11 %
Eosinophils Absolute: 0.5 10*3/uL (ref 0.0–0.7)
HCT: 29 % — ABNORMAL LOW (ref 36.0–46.0)
Hemoglobin: 9 g/dL — ABNORMAL LOW (ref 12.0–15.0)
LYMPHS ABS: 0.7 10*3/uL (ref 0.7–4.0)
Lymphocytes Relative: 15 %
MCH: 29.2 pg (ref 26.0–34.0)
MCHC: 31 g/dL (ref 30.0–36.0)
MCV: 94.2 fL (ref 78.0–100.0)
MONOS PCT: 9 %
Monocytes Absolute: 0.4 10*3/uL (ref 0.1–1.0)
Neutro Abs: 2.8 10*3/uL (ref 1.7–7.7)
Neutrophils Relative %: 64 %
PLATELETS: 226 10*3/uL (ref 150–400)
RBC: 3.08 MIL/uL — ABNORMAL LOW (ref 3.87–5.11)
RDW: 13 % (ref 11.5–15.5)
WBC: 4.4 10*3/uL (ref 4.0–10.5)

## 2016-06-01 LAB — GLUCOSE, CAPILLARY: GLUCOSE-CAPILLARY: 304 mg/dL — AB (ref 65–99)

## 2016-06-01 NOTE — Progress Notes (Signed)
Hematology and Oncology Follow Up Visit  [name] [id number] 07-30-1940 76 y.o. 06/01/2016 1:21 PM   Principle Diagnosis: Encounter Diagnoses  Name Primary?  . HHT (hereditary hemorrhagic telangiectasia) ([employer])   . Gastric AVM   . Angiodysplasia of stomach with hemorrhage Yes  Clinical summary: 76 year old lady with hereditary hemorrhagic telangiectasias with primary manifestation of her disease being chronic GI blood loss from gastric and, likely intestinal, angiodysplasia. She also has intermittent epistaxis. She requires frequent transfusions and parenteral iron supplementation. She does not tolerate oral iron. Transfusion requirements are becoming more frequent.  She last required hospital admission for urgent blood transfusion on March 02, 2016. The last time she required laser ablation was in August 2017.  Only a single nonbleeding lesion was fulgurated at that time. Previous  upper endoscopy on October 04, 2013 by Dr. Laurence Spates. A number of her gastric AVMs were actively bleeding. Some were cauterized. The largest was clipped. Despite laser fulguration and clipping of gastric AVMs, she continues to lose blood on a continuous basis.  She had an episode of hematemesis and was admitted on Jul 05, 2014. A number of AVMs and previous scars from laser procedures were seen but no active bleeding at time of upper endoscopy on May 27. She had a another procedure on September 05, 2014. A large AVM along the greater curvature of the stomach was bleeding and required laser therapy.  She continues to be transfusion dependent. I alternate parenteral iron with blood to try to keep her stable. It is an ongoing challenge.  Bleeding is very sporadic but she usually winds up having to have a blood transfusion every 2-3 weeks.  Interim History:   Most recent transfusion given on March 7.  Hemoglobin has been holding steady around 9 g since that time. She was in for a routine visit with [name] on April 12.  Blood sugar was 600 which is unusual for her.  Levemir insulin increased to 22 units twice daily.  Short acting glipizide was discontinued and she was started on glipizide XL 5 mg daily.  Random blood sugar today is 304. She has been having problems with leg cramps.  Leg swelling.  No proteinuria.  No signs or symptoms of heart failure.  No suspicion for blood clots.  No major electrolyte abnormalities.  I put her on a trial of baclofen.  This apparently did not help.  She saw Dr. Inda Castle on March 19.  He increased her Lasix from 20 to 40 mg daily.  Elastic stockings prescribed.  Swelling and most of her symptoms have subsided.  Some of her swelling may be due to venous insufficiency and an old woman complicated by chronic anemia with decreased oncotic pressure from low hemoglobin.  Medications: reviewed  Allergies:  Allergies  Allergen Reactions  . Aspirin Nausea And Vomiting    Review of Systems: See interim history Remaining ROS negative:   Physical Exam: Blood pressure (!) 120/38, pulse 60, temperature 97.8 F (36.6 C), temperature source Oral, height 5\' 1"  (1.549 m), weight 142 lb 6.4 oz (64.6 kg), SpO2 100 %. Wt Readings from Last 3 Encounters:  06/01/16 142 lb 6.4 oz (64.6 kg)  05/27/16 141 lb 6.4 oz (64.1 kg)  05/21/16 146 lb (66.2 kg)     General appearance: Well-nourished African-American woman HENNT: Pharynx no erythema, exudate, mass, or ulcer.  Poor dentition.  She is missing a number of teeth.  No thyromegaly or thyroid nodules Lymph nodes: No cervical,  supraclavicular, or axillary lymphadenopathy Breasts: Lungs: Clear to auscultation, resonant to percussion throughout Heart: Regular rhythm, 9-5/2 systolic murmur loudest at the second right intercostal space also heard at the left sternal border, no gallop, no rub, no click, no edema Abdomen: Soft, nontender, normal bowel sounds, no mass, no organomegaly Extremities: No edema, no calf  tenderness Musculoskeletal: no joint deformities GU:  Vascular: Carotid pulses 2+, no bruits,  Neurologic: Alert, oriented, she could spell simple words like cat and house without difficulty but she could not spell them backwards.  It took her a while but she eventually got cat.  She was able to remember her husband's age.  PERRLA,  cranial nerves grossly normal, motor strength 5 over 5, reflexes 1+ symmetric, upper body coordination normal, gait normal, Skin: No rash or ecchymosis  Lab Results: CBC W/Diff    Component Value Date/Time   WBC 4.4 06/01/2016 1100   RBC 3.08 (L) 06/01/2016 1100   HGB 9.0 (L) 06/01/2016 1100   HGB 9.7 (L) 03/13/2015 1059   HCT 29.0 (L) 06/01/2016 1100   HCT 29.5 (L) 03/13/2015 1059   PLT 226 06/01/2016 1100   PLT 140 (L) 03/13/2015 1059   PLT 237 11/09/2014 1547   MCV 94.2 06/01/2016 1100   MCV 85.5 03/13/2015 1059   MCH 29.2 06/01/2016 1100   MCHC 31.0 06/01/2016 1100   RDW 13.0 06/01/2016 1100   RDW 21.7 (H) 03/13/2015 1059   LYMPHSABS 0.7 06/01/2016 1100   LYMPHSABS 0.4 (L) 03/13/2015 1059   MONOABS 0.4 06/01/2016 1100   MONOABS 0.3 03/13/2015 1059   EOSABS 0.5 06/01/2016 1100   EOSABS 0.3 03/13/2015 1059   BASOSABS 0.0 06/01/2016 1100   BASOSABS 0.0 03/13/2015 1059     Chemistry      Component Value Date/Time   NA 138 05/21/2016 1455   NA 141 05/11/2016 1105   NA 144 04/04/2014 1019   K 4.5 05/21/2016 1455   K 4.5 04/04/2014 1019   CL 102 05/21/2016 1455   CL 111 (H) 12/28/2011 0825   CO2 29 05/21/2016 1455   CO2 26 04/04/2014 1019   BUN 14 05/21/2016 1455   BUN 14 05/11/2016 1105   BUN 16.9 04/04/2014 1019   CREATININE 1.12 (H) 05/21/2016 1455   CREATININE 0.92 01/28/2015 0811   CREATININE 0.9 04/04/2014 1019      Component Value Date/Time   CALCIUM 9.5 05/21/2016 1455   CALCIUM 9.3 04/04/2014 1019   ALKPHOS 71 01/14/2016 1025   ALKPHOS 140 04/04/2014 1019   AST 21 01/14/2016 1025   AST 30 04/04/2014 1019   ALT 15  01/14/2016 1025   ALT 25 04/04/2014 1019   BILITOT 1.0 01/14/2016 1025   BILITOT 0.43 04/04/2014 1019     Hemoglobin 9 today which is good for her  Radiological Studies: No results found.  Impression:  #1.  HHT Intermittent epistaxis and GI bleeding. 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  #2.  Insulin-dependent diabetes. Medication changes as outlined above by her internist.  #3.  Bilateral leg swelling and pain. See discussion above  #4.  Aortic stenosis murmur. Most recent echocardiogram May 2016 preserved left ventricular ejection fraction 65-70%.  Grade 2 diastolic dysfunction.  No significant aortic stenosis noted at that time.   CC: Patient Care Team: [name], DO as PCP - General (Internal Medicine)  Murriel Hopper, MD, [place]  Hematology-Oncology/Internal Medicine     4/23/20181:21 PM

## 2016-06-01 NOTE — Telephone Encounter (Signed)
-----   Message from [name], MD sent at 05/28/2016  3:44 PM EDT ----- Call pt: no need for transfusion this week. See if she can come in Monday 4/30 for cbc

## 2016-06-01 NOTE — Telephone Encounter (Signed)
Pt's here today for visit w/Dr [name] - informed of results.

## 2016-06-01 NOTE — Patient Instructions (Signed)
Come in every Monday for a blood count Return visit with Dr Darnell Level I 6 months

## 2016-06-08 ENCOUNTER — Other Ambulatory Visit: Payer: Medicare Other

## 2016-06-10 ENCOUNTER — Other Ambulatory Visit: Payer: Self-pay | Admitting: Cardiology

## 2016-06-11 ENCOUNTER — Telehealth: Payer: Self-pay | Admitting: *Deleted

## 2016-06-11 NOTE — Telephone Encounter (Signed)
Called pt - no answer,called both #'s on chart; "mailbox full" unable leave message.  Pt called me back - stated she "didn't know" about coming on Monday for labs. Stated she can come tomorrow - talked w/ Dr Darnell Level. Appt scheduled for tomorrow @ [place] [facility] - no available appts Friday nor Monday. Appt held for Tuesday the 8th.

## 2016-06-11 NOTE — Telephone Encounter (Signed)
OK - if she needs blood she will have to be typed and crossed again

## 2016-06-11 NOTE — Addendum Note (Signed)
Addended by: Truddie Crumble on: 06/11/2016 04:33 PM   Modules accepted: Orders

## 2016-06-12 ENCOUNTER — Other Ambulatory Visit: Payer: Self-pay | Admitting: Oncology

## 2016-06-12 ENCOUNTER — Other Ambulatory Visit (HOSPITAL_COMMUNITY): Payer: Self-pay | Admitting: *Deleted

## 2016-06-12 ENCOUNTER — Other Ambulatory Visit (INDEPENDENT_AMBULATORY_CARE_PROVIDER_SITE_OTHER): Payer: Medicare Other

## 2016-06-12 DIAGNOSIS — K31811 Angiodysplasia of stomach and duodenum with bleeding: Secondary | ICD-10-CM

## 2016-06-12 DIAGNOSIS — I780 Hereditary hemorrhagic telangiectasia: Secondary | ICD-10-CM

## 2016-06-12 DIAGNOSIS — K31819 Angiodysplasia of stomach and duodenum without bleeding: Secondary | ICD-10-CM

## 2016-06-12 LAB — CBC WITH DIFFERENTIAL/PLATELET
Basophils Absolute: 0 10*3/uL (ref 0.0–0.1)
Basophils Relative: 0 %
Eosinophils Absolute: 0.4 10*3/uL (ref 0.0–0.7)
Eosinophils Relative: 6 %
HEMATOCRIT: 24.4 % — AB (ref 36.0–46.0)
HEMOGLOBIN: 7.6 g/dL — AB (ref 12.0–15.0)
LYMPHS ABS: 0.8 10*3/uL (ref 0.7–4.0)
Lymphocytes Relative: 12 %
MCH: 28.9 pg (ref 26.0–34.0)
MCHC: 31.1 g/dL (ref 30.0–36.0)
MCV: 92.8 fL (ref 78.0–100.0)
MONOS PCT: 10 %
Monocytes Absolute: 0.7 10*3/uL (ref 0.1–1.0)
NEUTROS ABS: 4.8 10*3/uL (ref 1.7–7.7)
NEUTROS PCT: 72 %
Platelets: 213 10*3/uL (ref 150–400)
RBC: 2.63 MIL/uL — AB (ref 3.87–5.11)
RDW: 13.2 % (ref 11.5–15.5)
WBC: 6.7 10*3/uL (ref 4.0–10.5)

## 2016-06-12 LAB — SAMPLE TO BLOOD BANK

## 2016-06-12 LAB — GLUCOSE, CAPILLARY: Glucose-Capillary: 131 mg/dL — ABNORMAL HIGH (ref 65–99)

## 2016-06-12 NOTE — Telephone Encounter (Signed)
Hgb 7.6 - Dr [name] aware. Type and cross will be done Monday the 7th @ 1245 PM and will receive blood transfusion Tuesday @ 0800 Am at [place] - appts given to pt/husband.

## 2016-06-15 ENCOUNTER — Encounter: Payer: Self-pay | Admitting: Internal Medicine

## 2016-06-15 ENCOUNTER — Other Ambulatory Visit: Payer: Medicare Other

## 2016-06-15 ENCOUNTER — Telehealth: Payer: Self-pay | Admitting: *Deleted

## 2016-06-15 ENCOUNTER — Ambulatory Visit (INDEPENDENT_AMBULATORY_CARE_PROVIDER_SITE_OTHER): Payer: Medicare Other | Admitting: Internal Medicine

## 2016-06-15 ENCOUNTER — Encounter (HOSPITAL_COMMUNITY)
Admission: RE | Admit: 2016-06-15 | Discharge: 2016-06-15 | Disposition: A | Discharge status: Home / Self Care | Payer: Medicare Other | Source: Ambulatory Visit | Attending: Oncology | Admitting: Oncology

## 2016-06-15 ENCOUNTER — Encounter (HOSPITAL_COMMUNITY): Payer: Medicare Other

## 2016-06-15 DIAGNOSIS — Q2733 Arteriovenous malformation of digestive system vessel: Secondary | ICD-10-CM

## 2016-06-15 DIAGNOSIS — Z87891 Personal history of nicotine dependence: Secondary | ICD-10-CM

## 2016-06-15 DIAGNOSIS — K31811 Angiodysplasia of stomach and duodenum with bleeding: Secondary | ICD-10-CM

## 2016-06-15 DIAGNOSIS — M62838 Other muscle spasm: Secondary | ICD-10-CM

## 2016-06-15 DIAGNOSIS — D500 Iron deficiency anemia secondary to blood loss (chronic): Secondary | ICD-10-CM | POA: Insufficient documentation

## 2016-06-15 DIAGNOSIS — I780 Hereditary hemorrhagic telangiectasia: Secondary | ICD-10-CM | POA: Diagnosis not present

## 2016-06-15 LAB — PREPARE RBC (CROSSMATCH)

## 2016-06-15 MED ORDER — BACLOFEN 10 MG PO TABS: 5.0000 mg | ORAL_TABLET | Freq: Two times a day (BID) | ORAL | 0 refills | Status: DC

## 2016-06-15 NOTE — Progress Notes (Signed)
   CC: Leg cramping  HPI:  [name] is a 76 y.o. female with past medical history outlined below here for leg cramping. For the details of today's visit, please refer to the assessment and plan.  Past Medical History:  Diagnosis Date  . CHF (congestive heart failure) ([place])   . Chronic anemia   . Chronic diastolic CHF (congestive heart failure) ([place]) 10/03/2013  . Chronic GI bleeding    [name] 11/29/2014  . Family history of anesthesia complication    "niece has a hard time coming out" (09/15/2012)  . Frequent nosebleeds    chronic  . Gastric AV malformation    [name] 11/29/2014  . GERD (gastroesophageal reflux disease)   . Heart murmur   . History of blood transfusion "several"  . History of epistaxis   . HTN (hypertension), benign 03/02/2012  . Hyperlipidemia   . Iron deficiency anemia    chronic infusions"  . Lichen planus    Both lower extremities  . 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 Pneumonia 1990's X 2  . Seizures ([place]) 09/2014  . Telangiectasia    Gastric   . 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    Review of Systems:  All pertinents listed in HPI, otherwise negative  Physical Exam:  Vitals:   06/15/16 1541  BP: (!) 121/35  Pulse: 75  Temp: 98.1 F (36.7 C)  TempSrc: Oral  SpO2: 100%  Weight: 140 lb 8 oz (63.7 kg)  Height: 5\' 1"  (1.549 m)    Constitutional: NAD, appears comfortable HEENT: Atraumatic, normocephalic. PERRL, scleral pallor  Cardiovascular: RRR, no murmurs, rubs, or gallops.  Pulmonary/Chest: CTAB  Extremities: Warm and well perfused. Distal pulses intact. No edema. Lower extremity strength intact bilaterally  Neurological: A&Ox3, CN II - XII grossly intact.  Psychiatric: Normal mood and affect  Assessment & Plan:   See Encounters Tab for problem based charting.  Patient seen with Dr. Angelia Mould

## 2016-06-15 NOTE — Telephone Encounter (Signed)
Pt callss and states since last night she has cramps in her legs, feet, thighs and is in a great deal of pain, states she was up all night, her husband called EMS and she told them she had an appt today and was told just f/u at the appt, it was just labs evidently, she is in a great deal of discomfort, no appts noted this pm but could we try to see her in [facility]? She also states now her legs feel funny and weak like,  Please advise

## 2016-06-15 NOTE — Telephone Encounter (Signed)
Will possibly after speaking with dr Heber Dodge, add to [facility] but pt states it will take her husband about 45 mins to get her here, will reassess who will see her at that time

## 2016-06-15 NOTE — Assessment & Plan Note (Signed)
Patient is here today with complaints of bilateral muscle cramps and spasms that keep her up at night. Of note, patient has a history of iron deficiency anemia requiring frequent IV iron and blood transfusions secondary to her gastric AVMs and HHT. Her last CBC checked 06/12/2016 revealed a hemoglobin of 7.6. She is scheduled for a blood transfusion tomorrow. 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 Patient is already prescribed pramipexole. Husband manages her medications and reports she has been taking it regularly. She also has baclofen on her medication list. Husband is unsure if she's been taking this medication as he isn't familiar with the name. I reassured patient today that her cramping should get better after her blood transfusion tomorrow. I encouraged her to continue taking pramipexole in the evenings before bed. I refilled her baclofen 10 mg BID prn today and instructed her to take this for additional relief as needed. -- Blood transfusion tomorrow -- Continue pramipexole QHS -- Refilled baclofen 10 mg BID prn

## 2016-06-15 NOTE — Patient Instructions (Signed)
Ms. Legrande,  It was a pleasure to see you today. I'm sorry to hear about your leg cramping. I think this will improve after your blood transfusion tomorrow. Please pramipexole this evening prior to bed. You may also take baclofen (a muscle relaxer) twice a day as needed for additional relief. If you get home and you do not have the prescriptions, please give [place] a call tomorrow morning and I will send new prescriptions to your pharmacy. If you have any questions or concerns, call our clinic at [phone] or after hours call [phone] and ask for the internal medicine resident on call. Thank you!  - Dr. Philipp Ovens

## 2016-06-16 ENCOUNTER — Encounter (HOSPITAL_COMMUNITY)
Admission: RE | Admit: 2016-06-16 | Discharge: 2016-06-16 | Disposition: A | Discharge status: Home / Self Care | Payer: Medicare Other | Source: Ambulatory Visit | Attending: Oncology | Admitting: Oncology

## 2016-06-16 DIAGNOSIS — I780 Hereditary hemorrhagic telangiectasia: Secondary | ICD-10-CM

## 2016-06-16 DIAGNOSIS — K31811 Angiodysplasia of stomach and duodenum with bleeding: Secondary | ICD-10-CM

## 2016-06-16 DIAGNOSIS — D500 Iron deficiency anemia secondary to blood loss (chronic): Secondary | ICD-10-CM

## 2016-06-16 MED ORDER — HEPARIN SOD (PORK) LOCK FLUSH 100 UNIT/ML IV SOLN
INTRAVENOUS | Status: AC
  Administered 2016-06-16: 13:00:00 250 [IU]
  Filled 2016-06-16: qty 5

## 2016-06-16 MED ORDER — SODIUM CHLORIDE 0.9 % IV SOLN
Freq: Once | INTRAVENOUS | Status: AC
  Administered 2016-06-16: 08:00:00 via INTRAVENOUS

## 2016-06-17 LAB — TYPE AND SCREEN
ABO/RH(D): A POS
Antibody Screen: NEGATIVE
Unit division: 0
Unit division: 0

## 2016-06-17 LAB — BPAM RBC
BLOOD PRODUCT EXPIRATION DATE: 201805122359
Blood Product Expiration Date: 201805122359
ISSUE DATE / TIME: 201805081036
ISSUE DATE / TIME: 201805080809
UNIT TYPE AND RH: 6200
Unit Type and Rh: 6200

## 2016-06-17 NOTE — Progress Notes (Signed)
Internal Medicine Clinic Attending  I saw and evaluated the patient.  I personally confirmed the key portions of the history and exam documented by Dr. Guilloud and I reviewed pertinent patient test results.  The assessment, diagnosis, and plan were formulated together and I agree with the documentation in the resident's note.  

## 2016-06-18 NOTE — Progress Notes (Signed)
Personally  participated in, made any corrections needed, and agree with history, physical, neuro exam,assessment and plan as stated above.    Antonia Ahern, MD [facility] 

## 2016-06-22 ENCOUNTER — Other Ambulatory Visit (INDEPENDENT_AMBULATORY_CARE_PROVIDER_SITE_OTHER): Payer: Medicare Other

## 2016-06-22 DIAGNOSIS — I780 Hereditary hemorrhagic telangiectasia: Secondary | ICD-10-CM

## 2016-06-22 DIAGNOSIS — K31819 Angiodysplasia of stomach and duodenum without bleeding: Secondary | ICD-10-CM

## 2016-06-22 DIAGNOSIS — K31811 Angiodysplasia of stomach and duodenum with bleeding: Secondary | ICD-10-CM

## 2016-06-22 LAB — CBC WITH DIFFERENTIAL/PLATELET
BASOS PCT: 0 %
Basophils Absolute: 0 10*3/uL (ref 0.0–0.1)
EOS PCT: 2 %
Eosinophils Absolute: 0.1 10*3/uL (ref 0.0–0.7)
HEMATOCRIT: 30.1 % — AB (ref 36.0–46.0)
HEMOGLOBIN: 9.9 g/dL — AB (ref 12.0–15.0)
LYMPHS ABS: 0.3 10*3/uL — AB (ref 0.7–4.0)
Lymphocytes Relative: 5 %
MCH: 30 pg (ref 26.0–34.0)
MCHC: 32.9 g/dL (ref 30.0–36.0)
MCV: 91.2 fL (ref 78.0–100.0)
MONO ABS: 0.2 10*3/uL (ref 0.1–1.0)
MONOS PCT: 3 %
NEUTROS ABS: 6.3 10*3/uL (ref 1.7–7.7)
Neutrophils Relative %: 90 %
Platelets: 141 10*3/uL — ABNORMAL LOW (ref 150–400)
RBC: 3.3 MIL/uL — ABNORMAL LOW (ref 3.87–5.11)
RDW: 15.2 % (ref 11.5–15.5)
WBC: 6.9 10*3/uL (ref 4.0–10.5)

## 2016-06-22 LAB — SAMPLE TO BLOOD BANK

## 2016-06-23 ENCOUNTER — Telehealth: Payer: Self-pay | Admitting: *Deleted

## 2016-06-23 NOTE — Telephone Encounter (Signed)
Talked to Mr Berrian - informed pt's "blood count good. No Transfusion this week." per [place]. Stated "good" and he will let her know.

## 2016-06-23 NOTE — Telephone Encounter (Signed)
Pt called - no answer; no VM.

## 2016-06-23 NOTE — Telephone Encounter (Signed)
-----   Message from [name] Belt, MD sent at 06/23/2016  8:56 AM EDT ----- Call pt blood count good. No transfusion this week

## 2016-06-25 ENCOUNTER — Encounter: Payer: Self-pay | Admitting: *Deleted

## 2016-06-29 ENCOUNTER — Other Ambulatory Visit (INDEPENDENT_AMBULATORY_CARE_PROVIDER_SITE_OTHER): Payer: Medicare Other

## 2016-06-29 DIAGNOSIS — I780 Hereditary hemorrhagic telangiectasia: Secondary | ICD-10-CM | POA: Diagnosis not present

## 2016-06-29 DIAGNOSIS — K31811 Angiodysplasia of stomach and duodenum with bleeding: Secondary | ICD-10-CM

## 2016-06-29 DIAGNOSIS — K31819 Angiodysplasia of stomach and duodenum without bleeding: Secondary | ICD-10-CM

## 2016-06-29 LAB — CBC WITH DIFFERENTIAL/PLATELET
Basophils Absolute: 0 10*3/uL (ref 0.0–0.1)
Basophils Relative: 0 %
Eosinophils Absolute: 0.3 10*3/uL (ref 0.0–0.7)
Eosinophils Relative: 7 %
HCT: 31.1 % — ABNORMAL LOW (ref 36.0–46.0)
Hemoglobin: 9.8 g/dL — ABNORMAL LOW (ref 12.0–15.0)
Lymphocytes Relative: 15 %
Lymphs Abs: 0.8 10*3/uL (ref 0.7–4.0)
MCH: 30.2 pg (ref 26.0–34.0)
MCHC: 31.5 g/dL (ref 30.0–36.0)
MCV: 95.7 fL (ref 78.0–100.0)
Monocytes Absolute: 0.3 10*3/uL (ref 0.1–1.0)
Monocytes Relative: 6 %
Neutro Abs: 3.7 10*3/uL (ref 1.7–7.7)
Neutrophils Relative %: 72 %
Platelets: 230 10*3/uL (ref 150–400)
RBC: 3.25 MIL/uL — ABNORMAL LOW (ref 3.87–5.11)
RDW: 15.6 % — ABNORMAL HIGH (ref 11.5–15.5)
WBC: 5.2 10*3/uL (ref 4.0–10.5)

## 2016-06-29 LAB — SAMPLE TO BLOOD BANK

## 2016-06-29 LAB — GLUCOSE, CAPILLARY: Glucose-Capillary: 290 mg/dL — ABNORMAL HIGH (ref 65–99)

## 2016-06-30 ENCOUNTER — Telehealth: Payer: Self-pay | Admitting: *Deleted

## 2016-06-30 NOTE — Telephone Encounter (Signed)
-----   Message from [name], MD sent at 06/29/2016  3:16 PM EDT ----- Call pt: blood count stable. No transfusion this week. Blood sugar 290.

## 2016-06-30 NOTE — Telephone Encounter (Signed)
Pt called / informed "blood count stable. No transfusion this week. Blood sugar 290." per Dr [name]. Stated thank-you.

## 2016-07-02 ENCOUNTER — Ambulatory Visit (INDEPENDENT_AMBULATORY_CARE_PROVIDER_SITE_OTHER): Payer: Medicare Other | Admitting: Internal Medicine

## 2016-07-02 ENCOUNTER — Encounter: Payer: Self-pay | Admitting: Internal Medicine

## 2016-07-02 ENCOUNTER — Ambulatory Visit (INDEPENDENT_AMBULATORY_CARE_PROVIDER_SITE_OTHER): Payer: Medicare Other | Admitting: Dietician

## 2016-07-02 VITALS — BP 120/38 | HR 67 | Temp 98.4°F | Ht 61.0 in | Wt 148.4 lb

## 2016-07-02 DIAGNOSIS — Z794 Long term (current) use of insulin: Secondary | ICD-10-CM

## 2016-07-02 DIAGNOSIS — E1129 Type 2 diabetes mellitus with other diabetic kidney complication: Secondary | ICD-10-CM | POA: Diagnosis not present

## 2016-07-02 DIAGNOSIS — N289 Disorder of kidney and ureter, unspecified: Secondary | ICD-10-CM | POA: Diagnosis not present

## 2016-07-02 DIAGNOSIS — Z6828 Body mass index (BMI) 28.0-28.9, adult: Secondary | ICD-10-CM | POA: Diagnosis not present

## 2016-07-02 DIAGNOSIS — Z713 Dietary counseling and surveillance: Secondary | ICD-10-CM

## 2016-07-02 DIAGNOSIS — I780 Hereditary hemorrhagic telangiectasia: Secondary | ICD-10-CM

## 2016-07-02 DIAGNOSIS — E1121 Type 2 diabetes mellitus with diabetic nephropathy: Secondary | ICD-10-CM

## 2016-07-02 DIAGNOSIS — E1165 Type 2 diabetes mellitus with hyperglycemia: Principal | ICD-10-CM

## 2016-07-02 DIAGNOSIS — E118 Type 2 diabetes mellitus with unspecified complications: Secondary | ICD-10-CM | POA: Diagnosis not present

## 2016-07-02 DIAGNOSIS — Z0000 Encounter for general adult medical examination without abnormal findings: Secondary | ICD-10-CM

## 2016-07-02 LAB — GLUCOSE, CAPILLARY: GLUCOSE-CAPILLARY: 366 mg/dL — AB (ref 65–99)

## 2016-07-02 MED ORDER — INSULIN ASPART 100 UNIT/ML FLEXPEN: 2.0000 [IU] | PEN_INJECTOR | Freq: Three times a day (TID) | SUBCUTANEOUS | 11 refills | Status: DC

## 2016-07-02 MED ORDER — INSULIN PEN NEEDLE 31G X 5 MM MISC: 11 refills | Status: DC

## 2016-07-02 NOTE — Progress Notes (Addendum)
CC: here for DM f/u  HPI:  [name] is a 76 y.o. woman with a past medical history listed below here today for follow up of her DM.  Her CBG today is 366.  For details of today's visit and the status of her chronic medical issues please refer to the assessment and plan.   Past Medical History:  Diagnosis Date  . CHF (congestive heart failure) ([name])   . Chronic anemia   . Chronic diastolic CHF (congestive heart failure) ([name]) 10/03/2013  . Chronic GI bleeding    [name] 11/29/2014  . Family history of anesthesia complication    "niece has a hard time coming out" (09/15/2012)  . Frequent nosebleeds    chronic  . Gastric AV malformation    [name] 11/29/2014  . GERD (gastroesophageal reflux disease)   . Heart murmur   . History of blood transfusion "several"  . History of epistaxis   . HTN (hypertension), benign 03/02/2012  . Hyperlipidemia   . Iron deficiency anemia    chronic infusions"  . Lichen planus    Both lower extremities  . 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[name])    [name] 11/29/2014  . Pneumonia 1990's X 2  . Seizures ([name]) 09/2014  . Telangiectasia    Gastric   . Type II diabetes mellitus ([name]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    Review of Systems:  Please see pertinent ROS reviewed in HPI and problem based charting.   Physical Exam:  Vitals:   07/02/16 1448  BP: (!) 120/38  Pulse: 67  Temp: 98.4 F (36.9 C)  TempSrc: Oral  SpO2: 100%  Weight: 148 lb 6.4 oz (67.3 kg)  Height: 5\' 1"  (1.549 m)   General: pleasant woman, sitting in chair, NAD HEENT: PERRL, EOMI, no scleral icterus Cardiac: RRR, 3/6 systolic murmur Pulm: clear to auscultation bilaterally, moving normal volumes of air Ext: warm and well perfused, 1+ pedal edema Neuro: alert and oriented, cranial nerves II-XII grossly intact   Assessment & Plan:   See Encounters Tab for problem based charting.  Patient discussed with Dr. Eppie Gibson .  DM (diabetes mellitus), type 2, uncontrolled, with renal  complications ([place]) Assessment: Her primary concern is her elevated blood sugars at home despite an acceptable Hgb A1c.  Her A1c is likely falsely low given the frequency of her needed blood transfusions.  She is checking her blood sugars before and after eating.  She is currently on Levemir 22 units BID and Glipizide XL 5mg  daily.  Blood sugar pattern shows increasing sugars over the course of the day. Approximately 30% are in target.  Previously, she was reluctant to start meal time insulin due to concern of having to keep a stricter schedule.  Does not report polyuria or polydipsia.  She reports occasional abdominal discomfort after meals.  Plan: - we had her meet with our DM educator today to discuss nutrition, timing of her blood sugar checks and starting mealtime insulin. - we will begin Novolog 2 units TID with meals - we will decrease her Levemir to 19 units BID - discontinue Glipizide - RTC 1 month with meter to check on glycemic control - foot exam done today - urine microalbumin obtained  Healthcare maintenance Assessment/Plan: - due for DEXA >> ordered in Dec 2017.  Patient given contact information of breast center to re-schedule - due for Mammogram >> ordered today - due for foot exam >> completed today - due for urine microalbumin >> obtained today - due for tetanus  and PCV 13 >> patient declines - she reports recent work on her shower to create a walk in shower for easier access but would like a shower seat to help prevent a fall >> shower stool ordered.  HHT (hereditary hemorrhagic telangiectasia) ([name]) Assessment:  Denies any current symptoms concerning for GI bleed.  Hemoglobin stable this week.    Plan: - follow up with Dr. [name] as scheduled.

## 2016-07-02 NOTE — Progress Notes (Signed)
  Medical Nutrition Therapy:  Appt start time: 5361 end time:  1645. Visit # 5  last series of MNT visits 03/2015 through 09/2015  Assessment:  Primary concerns today: glycemic control Asked to see [name] for dietery evaluation and counseling to improve her blood sugars. She is known to Probation officer from MNT series last year. She reports eating well and  does not want to gain weight. She has memory problems so question accuracy of information she provided today. Husband assist with medication administration. She is in a wheelchair today and says she walks around her house with a walker that has a chair for her to rest when her legs give out. She prepares her own meals. Her husband shops, they go out often to [employer] on sundays  ANTHROPOMETRICS: weight-148#,  BMI-28#, this is appropriate WEIGHT HISTORY: has been gaining recently. MEDICATIONS: take levemir by pen at 11 am-22u and 830pm-22u:  BLOOD SUGAR:Checks before and after she eats.Blood sugar  pattern shows increasing blood sugars over the day. Basal blood sugars are ~ 30% in target. To add prandial need to back off on basal to prevent fasting hypoglcyemia.  DIETARY INTAKE: 930 am- 1x scrambled eggs, 1x sausage 1/3 cupoatmeal, water 11am- tomato sandwich- salad bread- 1 slice, slice tomato, sometimes applesauce or fruit cup, water 330 pm- 6 oz yogurt or 4 oz applesauce, water or 8 oz. cranberry or apple juice 1x/week 930 PM Peanut butter sandwich or 3 peanut butter crackers, ice water  eats vegetables on sundays. [employer] sometimes green beans, salad, broccoli & cheese   3 bottles water every night;  2 cookies every now and then Meals on wheels- every day 10 AM; unclear what of this she eats and when after asking both she and her husband   Progress Towards Goal(s):  In progress.   Nutritional Diagnosis:  [place]-2.2 Altered nutrition-related laboratory As related to high prancidal blood sugar.  As evidenced by her meter download.    Intervention:   Nutrition education and counseling about healthy food choices, purpose of meal time insulin in encouraging adequate nutritional status. . Coordination of care:   Teaching Method Utilized: Visual, Auditory, Hands on Handouts given during visit include:AVS Barriers to learning/adherence to lifestyle change: support, memory issues Demonstrated degree of understanding via:  Teach Back   Monitoring/Evaluation:  Dietary intake, exercise, meter, and body weight in 1 week(s). By phone 1 month in office.  [place], [place] 07/02/2016 4:58 PM.

## 2016-07-03 LAB — MICROALBUMIN / CREATININE URINE RATIO
CREATININE, UR: 199.9 mg/dL
MICROALB/CREAT RATIO: 3.2 mg/g{creat} (ref 0.0–30.0)
Microalbumin, Urine: 6.4 ug/mL

## 2016-07-03 MED ORDER — INSULIN DETEMIR 100 UNIT/ML FLEXPEN: 19.0000 [IU] | PEN_INJECTOR | Freq: Two times a day (BID) | SUBCUTANEOUS | 11 refills | Status: DC

## 2016-07-03 NOTE — Progress Notes (Signed)
Case discussed with Dr. Wallace at the time of the visit.  We reviewed the resident's history and exam and pertinent patient test results.  I agree with the assessment, diagnosis and plan of care documented in the resident's note. 

## 2016-07-03 NOTE — Assessment & Plan Note (Signed)
Assessment:  Denies any current symptoms concerning for GI bleed.  Hemoglobin stable this week.    Plan: - follow up with Dr. [name] as scheduled.

## 2016-07-03 NOTE — Assessment & Plan Note (Addendum)
Assessment/Plan: - due for DEXA >> ordered in Dec 2017.  Patient given contact information of breast center to re-schedule - due for Mammogram >> ordered today - due for foot exam >> completed today - due for urine microalbumin >> obtained today - due for tetanus and PCV 13 >> patient declines - she reports recent work on her shower to create a walk in shower for easier access but would like a shower seat to help prevent a fall >> shower stool ordered.

## 2016-07-03 NOTE — Assessment & Plan Note (Addendum)
Assessment: Her primary concern is her elevated blood sugars at home despite an acceptable Hgb A1c.  Her A1c is likely falsely low given the frequency of her needed blood transfusions.  She is checking her blood sugars before and after eating.  She is currently on Levemir 22 units BID and Glipizide XL 5mg  daily.  Blood sugar pattern shows increasing sugars over the course of the day. Approximately 30% are in target.  Previously, she was reluctant to start meal time insulin due to concern of having to keep a stricter schedule.  Does not report polyuria or polydipsia.  She reports occasional abdominal discomfort after meals.  Plan: - we had her meet with our DM educator today to discuss nutrition, timing of her blood sugar checks and starting mealtime insulin. - we will begin Novolog 2 units TID with meals - we will decrease her Levemir to 19 units BID - discontinue Glipizide - RTC 1 month with meter to check on glycemic control - foot exam done today - urine microalbumin obtained

## 2016-07-07 ENCOUNTER — Telehealth: Payer: Self-pay | Admitting: *Deleted

## 2016-07-07 ENCOUNTER — Other Ambulatory Visit: Payer: Self-pay | Admitting: Oncology

## 2016-07-07 ENCOUNTER — Other Ambulatory Visit (INDEPENDENT_AMBULATORY_CARE_PROVIDER_SITE_OTHER): Payer: Medicare Other

## 2016-07-07 ENCOUNTER — Ambulatory Visit: Payer: Medicare Other | Admitting: Pharmacist

## 2016-07-07 DIAGNOSIS — I780 Hereditary hemorrhagic telangiectasia: Secondary | ICD-10-CM

## 2016-07-07 DIAGNOSIS — K31811 Angiodysplasia of stomach and duodenum with bleeding: Secondary | ICD-10-CM

## 2016-07-07 DIAGNOSIS — K31819 Angiodysplasia of stomach and duodenum without bleeding: Secondary | ICD-10-CM

## 2016-07-07 LAB — CBC WITH DIFFERENTIAL/PLATELET
Basophils Absolute: 0 10*3/uL (ref 0.0–0.1)
Basophils Relative: 0 %
Eosinophils Absolute: 0.3 10*3/uL (ref 0.0–0.7)
Eosinophils Relative: 5 %
HCT: 25.3 % — ABNORMAL LOW (ref 36.0–46.0)
Hemoglobin: 7.9 g/dL — ABNORMAL LOW (ref 12.0–15.0)
Lymphocytes Relative: 13 %
Lymphs Abs: 0.6 10*3/uL — ABNORMAL LOW (ref 0.7–4.0)
MCH: 30.3 pg (ref 26.0–34.0)
MCHC: 31.2 g/dL (ref 30.0–36.0)
MCV: 96.9 fL (ref 78.0–100.0)
Monocytes Absolute: 0.3 10*3/uL (ref 0.1–1.0)
Monocytes Relative: 7 %
Neutro Abs: 3.5 10*3/uL (ref 1.7–7.7)
Neutrophils Relative %: 75 %
Platelets: 175 10*3/uL (ref 150–400)
RBC: 2.61 MIL/uL — ABNORMAL LOW (ref 3.87–5.11)
RDW: 15.8 % — ABNORMAL HIGH (ref 11.5–15.5)
WBC: 4.6 10*3/uL (ref 4.0–10.5)

## 2016-07-07 LAB — PREPARE RBC (CROSSMATCH)

## 2016-07-07 MED ORDER — INSULIN LISPRO 100 UNIT/ML (KWIKPEN): 2.0000 [IU] | PEN_INJECTOR | Freq: Three times a day (TID) | SUBCUTANEOUS | 11 refills | Status: DC

## 2016-07-07 MED ORDER — ONETOUCH VERIO W/DEVICE KIT: 1.0000 | PACK | Freq: Once | 0 refills | Status: AC

## 2016-07-07 MED ORDER — INSULIN PEN NEEDLE 31G X 5 MM MISC: 11 refills | Status: DC

## 2016-07-07 MED ORDER — INSULIN GLARGINE 100 UNIT/ML SOLOSTAR PEN: 21.0000 [IU] | PEN_INJECTOR | Freq: Two times a day (BID) | SUBCUTANEOUS | 11 refills | Status: DC

## 2016-07-07 MED ORDER — GLUCOSE BLOOD VI STRP: ORAL_STRIP | 12 refills | Status: DC

## 2016-07-07 MED ORDER — INSULIN GLARGINE 100 UNIT/ML SOLOSTAR PEN
19.0000 [IU] | PEN_INJECTOR | Freq: Two times a day (BID) | SUBCUTANEOUS | 11 refills | Status: DC
Start: 2016-07-07 — End: 2016-07-27

## 2016-07-07 NOTE — Progress Notes (Signed)
S: [name] is a 76 y.o. female reports to clinical pharmacist appointment for medication help. Patient did not bring medication bottles.   Allergies  Allergen Reactions  . Aspirin Nausea And Vomiting   Medication Sig  AMITIZA 24 MCG capsule Take 24 mcg by mouth 2 (two) times daily.  baclofen (LIORESAL) 10 MG tablet Take 0.5 tablets (5 mg total) by mouth 2 (two) times daily.  Blood Glucose Monitoring Suppl (ONETOUCH VERIO) w/Device KIT 1 Device by Does not apply route once.  calcium citrate-vitamin D (CITRACAL+D) 315-200 MG-UNIT tablet Take 1 tablet by mouth 2 (two) times daily.  cetaphil (CETAPHIL) lotion Apply 1 application topically as needed for dry skin.  diclofenac sodium (VOLTAREN) 1 % GEL Apply 2 g topically 4 (four) times daily.  donepezil (ARICEPT) 10 MG tablet Take 1 tablet (10 mg total) by mouth at bedtime.  Elastic Bandages & Supports (MEDICAL COMPRESSION STOCKINGS) MISC Wear as much as possible while awake to reduce swelling  ferrous fumarate (HEMOCYTE - 106 MG FE) 325 (106 Fe) MG TABS tablet Take 1 tablet (106 mg of iron total) by mouth 2 (two) times daily. Reported on 01/30/2015  furosemide (LASIX) 20 MG tablet Take 1 tablet (20 mg total) by mouth daily.  glucose blood (ONETOUCH VERIO) test strip Use as instructed 3-4 times daily. E11.29, insulin requiring  Insulin Glargine (LANTUS) 100 UNIT/ML Solostar Pen Inject 19 Units into the skin 2 (two) times daily.  insulin lispro (HUMALOG) 100 UNIT/ML KiwkPen Inject 0.02 mLs (2 Units total) into the skin 3 (three) times daily.  Insulin Pen Needle 31G X 5 MM MISC Use to inject insulin  KLOR-CON M20 20 MEQ tablet TAKE ONE TABLET BY MOUTH ONCE DAILY  lactulose (CHRONULAC) 10 GM/15ML solution Take 15 mLs (10 g total) by mouth 2 (two) times daily.  levETIRAcetam (KEPPRA) 500 MG tablet Take 1 tablet (500 mg total) by mouth 2 (two) times daily.  memantine (NAMENDA) 10 MG tablet Take 1 tablet (10 mg total) by mouth 2 (two) times  daily.  ONETOUCH DELICA LANCETS FINE MISC Check blood sugar up to 3 times a day  oxymetazoline (AFRIN) 0.05 % nasal spray Place 1 spray into both nostrils 2 (two) times daily as needed (Epistaxis).  pantoprazole (PROTONIX) 40 MG tablet Take 1 tablet (40 mg total) by mouth 2 (two) times daily.  pramipexole (MIRAPEX) 0.125 MG tablet Take 1 tablet (0.125 mg total) by mouth daily.   Past Medical History:  Diagnosis Date  . CHF (congestive heart failure) ([place])   . Chronic anemia   . Chronic diastolic CHF (congestive heart failure) ([place]) 10/03/2013  . Chronic GI bleeding    [name] 11/29/2014  . Family history of anesthesia complication    "niece has a hard time coming out" (09/15/2012)  . Frequent nosebleeds    chronic  . Gastric AV malformation    [name] 11/29/2014  . GERD (gastroesophageal reflux disease)   . Heart murmur   . History of blood transfusion "several"  . History of epistaxis   . HTN (hypertension), benign 03/02/2012  . Hyperlipidemia   . Iron deficiency anemia    chronic infusions"  . Lichen planus    Both lower extremities  . 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[place])    [name] 11/29/2014  . Pneumonia 1990's X 2  . Seizures ([place]) 09/2014  . Telangiectasia    Gastric   . 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   Social History   Social History  .  Marital status: Married    Spouse name: [name]  . Number of children: 0  . Years of education: 9   Occupational History  . Retired Engineer, manufacturing systems Retired   Social History Main Topics  . Smoking status: Former Smoker    Packs/day: 1.00    Years: 20.00    Types: Cigarettes    Quit date: 02/10/1971  . Smokeless tobacco: Never Used     Comment: 09/15/2012 "smoked 50-60 yr ago"  . Alcohol use No  . Drug use: No  . Sexual activity: Not Asked   Other Topics Concern  . None   Social History Narrative   Married, lives with husband, [name].  Ambulates without assistance.     Caffeine use: none   Family History   Problem Relation Age of Onset  . Stroke Father   . Breast cancer Unknown   . Malignant hyperthermia Neg Hx   . Seizures Neg Hx    O:    Component Value Date/Time   CHOL 141 11/29/2014 1026   HDL 31 (L) 11/29/2014 1026   TRIG 141 11/29/2014 1026   AST 21 01/14/2016 1025   AST 30 04/04/2014 1019   ALT 15 01/14/2016 1025   ALT 25 04/04/2014 1019   NA 138 05/21/2016 1455   NA 141 05/11/2016 1105   NA 144 04/04/2014 1019   K 4.5 05/21/2016 1455   K 4.5 04/04/2014 1019   CL 102 05/21/2016 1455   CL 111 (H) 12/28/2011 0825   CO2 29 05/21/2016 1455   CO2 26 04/04/2014 1019   GLUCOSE 600 (HH) 05/21/2016 1455   GLUCOSE 156 (H) 04/04/2014 1019   GLUCOSE 152 (H) 12/28/2011 0825   HGBA1C 6.6 05/21/2016 1524   HGBA1C 7.3 (H) 09/05/2014 0720   BUN 14 05/21/2016 1455   BUN 14 05/11/2016 1105   BUN 16.9 04/04/2014 1019   CREATININE 1.12 (H) 05/21/2016 1455   CREATININE 0.92 01/28/2015 0811   CREATININE 0.9 04/04/2014 1019   CALCIUM 9.5 05/21/2016 1455   CALCIUM 9.3 04/04/2014 1019   GFRAA 54 (L) 05/21/2016 1455   WBC 4.6 07/07/2016 1420   HGB 7.9 (L) 07/07/2016 1420   HGB 9.7 (L) 03/13/2015 1059   HCT 25.3 (L) 07/07/2016 1420   HCT 29.5 (L) 03/13/2015 1059   PLT 175 07/07/2016 1420   PLT 140 (L) 03/13/2015 1059   PLT 237 11/09/2014 1547   TSH 1.730 01/30/2015 1216   Ht Readings from Last 2 Encounters:  07/02/16 '5\' 1"'$  (1.549 m)  06/16/16 '5\' 3"'$  (1.6 m)   Wt Readings from Last 2 Encounters:  07/02/16 148 lb 6.4 oz (67.3 kg)  06/16/16 144 lb (65.3 kg)   Body mass index is 28.04 kg/m. BP Readings from Last 3 Encounters:  07/02/16 (!) 120/38  06/16/16 (!) 130/50  06/15/16 (!) 121/35    A/P:  Patient states she is unable to afford her insulin. Switched from [employer] to [employer] (pharmacy confirmed $3 copay).  Patient brought BG meter, BG today was 412. Averages around 330. Has had 2 instances of 70 BG on May 10 and May 19, one instance of 80 BG on May  3.  Patient had been taking insulin detemir 22 units BID and was still taking glipizide. Educated patient to stop glipizide and switch to insulin glargine 19 units BID.   Follow up appointment 1 week. Patient was asked to bring medications at that time.  An after visit summary was provided and patient advised to follow up  if any changes in condition or questions regarding medications arise.   The patient verbalized understanding of information provided by repeating back concepts discussed.

## 2016-07-07 NOTE — Addendum Note (Signed)
Addended by: Ebbie Latus on: 07/07/2016 03:57 PM   Modules accepted: Orders

## 2016-07-07 NOTE — Addendum Note (Signed)
Addended by: Forde Dandy on: 07/07/2016 04:02 PM   Modules accepted: Orders

## 2016-07-07 NOTE — Telephone Encounter (Signed)
-----   Message from [name], MD sent at 07/07/2016  2:50 PM EDT ----- Call pt: blood count borderline - we will need a bed for transfusion 1st available prefer [facility] short stay

## 2016-07-07 NOTE — Telephone Encounter (Signed)
Appt scheduled for Friday @ 0800 AM here @ [facility] Short Stay. Pt / husband informed; appt card given.

## 2016-07-08 LAB — SAMPLE TO BLOOD BANK

## 2016-07-09 ENCOUNTER — Other Ambulatory Visit (HOSPITAL_COMMUNITY): Payer: Self-pay | Admitting: *Deleted

## 2016-07-10 ENCOUNTER — Telehealth: Payer: Self-pay | Admitting: Dietician

## 2016-07-10 ENCOUNTER — Encounter (HOSPITAL_COMMUNITY)
Admission: RE | Admit: 2016-07-10 | Discharge: 2016-07-10 | Disposition: A | Discharge status: Home / Self Care | Payer: Medicare Other | Source: Ambulatory Visit | Attending: Oncology | Admitting: Oncology

## 2016-07-10 DIAGNOSIS — I780 Hereditary hemorrhagic telangiectasia: Secondary | ICD-10-CM | POA: Diagnosis not present

## 2016-07-10 DIAGNOSIS — K31811 Angiodysplasia of stomach and duodenum with bleeding: Secondary | ICD-10-CM | POA: Diagnosis not present

## 2016-07-10 MED ORDER — HEPARIN SOD (PORK) LOCK FLUSH 100 UNIT/ML IV SOLN
INTRAVENOUS | Status: AC
  Administered 2016-07-10: 13:00:00
  Filled 2016-07-10: qty 5

## 2016-07-10 MED ORDER — SODIUM CHLORIDE 0.9 % IV SOLN
Freq: Once | INTRAVENOUS | Status: DC
Start: 2016-07-10 — End: 2016-07-11

## 2016-07-10 MED ORDER — SODIUM CHLORIDE 0.9 % IV SOLN: Freq: Once | INTRAVENOUS | Status: DC

## 2016-07-10 NOTE — Telephone Encounter (Signed)
Called patient to follow up on blood sugars. She is currently here at the hospital getting blood transfusions. Per husband some better, but he was not abel to recall any numbers. Will try back later when she is home per his request

## 2016-07-11 LAB — TYPE AND SCREEN
ABO/RH(D): A POS
ANTIBODY SCREEN: NEGATIVE
Unit division: 0
Unit division: 0

## 2016-07-11 LAB — BPAM RBC
BLOOD PRODUCT EXPIRATION DATE: 201806122359
Blood Product Expiration Date: 201806122359
ISSUE DATE / TIME: 201806010802
ISSUE DATE / TIME: 201806011048
Unit Type and Rh: 6200
Unit Type and Rh: 6200

## 2016-07-13 ENCOUNTER — Other Ambulatory Visit: Payer: Self-pay | Admitting: Cardiology

## 2016-07-13 ENCOUNTER — Other Ambulatory Visit (INDEPENDENT_AMBULATORY_CARE_PROVIDER_SITE_OTHER): Payer: Medicare Other

## 2016-07-13 ENCOUNTER — Ambulatory Visit: Payer: Medicare Other | Admitting: Pharmacist

## 2016-07-13 DIAGNOSIS — I780 Hereditary hemorrhagic telangiectasia: Secondary | ICD-10-CM | POA: Diagnosis not present

## 2016-07-13 DIAGNOSIS — K31819 Angiodysplasia of stomach and duodenum without bleeding: Secondary | ICD-10-CM

## 2016-07-13 DIAGNOSIS — Z719 Counseling, unspecified: Secondary | ICD-10-CM

## 2016-07-13 DIAGNOSIS — K31811 Angiodysplasia of stomach and duodenum with bleeding: Secondary | ICD-10-CM

## 2016-07-13 LAB — CBC WITH DIFFERENTIAL/PLATELET
BASOS PCT: 0 %
Basophils Absolute: 0 10*3/uL (ref 0.0–0.1)
EOS ABS: 0.3 10*3/uL (ref 0.0–0.7)
Eosinophils Relative: 6 %
HEMATOCRIT: 28.7 % — AB (ref 36.0–46.0)
HEMOGLOBIN: 9 g/dL — AB (ref 12.0–15.0)
LYMPHS ABS: 0.6 10*3/uL — AB (ref 0.7–4.0)
Lymphocytes Relative: 11 %
MCH: 30.2 pg (ref 26.0–34.0)
MCHC: 31.4 g/dL (ref 30.0–36.0)
MCV: 96.3 fL (ref 78.0–100.0)
MONOS PCT: 11 %
Monocytes Absolute: 0.6 10*3/uL (ref 0.1–1.0)
NEUTROS ABS: 3.8 10*3/uL (ref 1.7–7.7)
NEUTROS PCT: 72 %
Platelets: 160 10*3/uL (ref 150–400)
RBC: 2.98 MIL/uL — AB (ref 3.87–5.11)
RDW: 17 % — ABNORMAL HIGH (ref 11.5–15.5)
WBC: 5.3 10*3/uL (ref 4.0–10.5)

## 2016-07-13 NOTE — Telephone Encounter (Signed)
Spoke with pharmacy who was able to follow up on [name]'s blood sugars.  jenningSuggest THN for support and careful titration of meal time insulin with equal decreases in basal insulin to prevent hypoglycemia. Consider CBG review and adjustments during her weekly visits. Plan to discuss with pharmacy a nd Dr. Juleen China.

## 2016-07-14 LAB — SAMPLE TO BLOOD BANK

## 2016-07-15 ENCOUNTER — Telehealth: Payer: Self-pay

## 2016-07-16 ENCOUNTER — Other Ambulatory Visit: Payer: Self-pay | Admitting: Dietician

## 2016-07-16 DIAGNOSIS — E1121 Type 2 diabetes mellitus with diabetic nephropathy: Secondary | ICD-10-CM

## 2016-07-16 DIAGNOSIS — E1165 Type 2 diabetes mellitus with hyperglycemia: Principal | ICD-10-CM

## 2016-07-16 NOTE — Progress Notes (Unsigned)
Please sign order

## 2016-07-16 NOTE — Telephone Encounter (Signed)
Order placed for [facility] RN.  Thanks.

## 2016-07-16 NOTE — Progress Notes (Signed)
Freestyle Libre Pro was reviewed with the patient, including instructions, indication, and goals of therapy. Patient agreed she would be interested in having the CGM sensor placed on the date of her next visit (07/20/16). PCP approved. Will help to coordinate care.

## 2016-07-17 ENCOUNTER — Other Ambulatory Visit: Payer: Self-pay | Admitting: Oncology

## 2016-07-17 DIAGNOSIS — I780 Hereditary hemorrhagic telangiectasia: Secondary | ICD-10-CM

## 2016-07-17 DIAGNOSIS — K31819 Angiodysplasia of stomach and duodenum without bleeding: Secondary | ICD-10-CM

## 2016-07-20 ENCOUNTER — Telehealth: Payer: Self-pay | Admitting: *Deleted

## 2016-07-20 ENCOUNTER — Other Ambulatory Visit (INDEPENDENT_AMBULATORY_CARE_PROVIDER_SITE_OTHER): Payer: Medicare Other

## 2016-07-20 ENCOUNTER — Other Ambulatory Visit: Payer: Self-pay | Admitting: Oncology

## 2016-07-20 ENCOUNTER — Ambulatory Visit (INDEPENDENT_AMBULATORY_CARE_PROVIDER_SITE_OTHER): Payer: Medicare Other | Admitting: Dietician

## 2016-07-20 DIAGNOSIS — Z794 Long term (current) use of insulin: Secondary | ICD-10-CM | POA: Diagnosis not present

## 2016-07-20 DIAGNOSIS — K31811 Angiodysplasia of stomach and duodenum with bleeding: Secondary | ICD-10-CM

## 2016-07-20 DIAGNOSIS — E118 Type 2 diabetes mellitus with unspecified complications: Secondary | ICD-10-CM

## 2016-07-20 DIAGNOSIS — K31819 Angiodysplasia of stomach and duodenum without bleeding: Secondary | ICD-10-CM

## 2016-07-20 DIAGNOSIS — I780 Hereditary hemorrhagic telangiectasia: Secondary | ICD-10-CM | POA: Diagnosis not present

## 2016-07-20 DIAGNOSIS — E1165 Type 2 diabetes mellitus with hyperglycemia: Principal | ICD-10-CM

## 2016-07-20 DIAGNOSIS — E1121 Type 2 diabetes mellitus with diabetic nephropathy: Secondary | ICD-10-CM

## 2016-07-20 LAB — CBC WITH DIFFERENTIAL/PLATELET
BASOS ABS: 0 10*3/uL (ref 0.0–0.1)
BASOS PCT: 0 %
EOS ABS: 0.3 10*3/uL (ref 0.0–0.7)
Eosinophils Relative: 6 %
HEMATOCRIT: 23.3 % — AB (ref 36.0–46.0)
HEMOGLOBIN: 7.1 g/dL — AB (ref 12.0–15.0)
LYMPHS PCT: 11 %
Lymphs Abs: 0.5 10*3/uL — ABNORMAL LOW (ref 0.7–4.0)
MCH: 29.3 pg (ref 26.0–34.0)
MCHC: 30.5 g/dL (ref 30.0–36.0)
MCV: 96.3 fL (ref 78.0–100.0)
Monocytes Absolute: 0.5 10*3/uL (ref 0.1–1.0)
Monocytes Relative: 11 %
Neutro Abs: 3.3 10*3/uL (ref 1.7–7.7)
Neutrophils Relative %: 72 %
Platelets: 195 10*3/uL (ref 150–400)
RBC: 2.42 MIL/uL — AB (ref 3.87–5.11)
RDW: 15.3 % (ref 11.5–15.5)
WBC: 4.6 10*3/uL (ref 4.0–10.5)

## 2016-07-20 LAB — PREPARE RBC (CROSSMATCH)

## 2016-07-20 LAB — SAMPLE TO BLOOD BANK

## 2016-07-20 LAB — FERRITIN: Ferritin: 10 ng/mL — ABNORMAL LOW (ref 11–307)

## 2016-07-20 NOTE — Patient Instructions (Signed)
Please record everything you eat and at what time.  Please write down how much and when you take your insulin.   Check your blood sugars as you usually do.  Call me with questions!  Butch Penny

## 2016-07-20 NOTE — Telephone Encounter (Signed)
Pt's hgb is 7.1 - Dr [name] made awared. Order to transfuse - called [facility] Short Stay; appt scheduled for tomorrow @ 0800 AM - pt and husband informed of appt.

## 2016-07-20 NOTE — Progress Notes (Signed)
Freestyle Libre Pro CGM sensor placed and started. Patient and husband were educated about wearing sensor, keeping food, activity and medication log and when to call office. Follow up was arranged with the patient.  Mr. Brose think she began using the 2 units of Novolog with her meals three times  A day last Monday.  Meter was also downloaded.

## 2016-07-21 ENCOUNTER — Ambulatory Visit (HOSPITAL_COMMUNITY)
Admission: RE | Admit: 2016-07-21 | Discharge: 2016-07-21 | Disposition: A | Discharge status: Home / Self Care | Payer: Medicare Other | Source: Ambulatory Visit | Attending: Oncology | Admitting: Oncology

## 2016-07-21 DIAGNOSIS — I780 Hereditary hemorrhagic telangiectasia: Secondary | ICD-10-CM

## 2016-07-21 DIAGNOSIS — K31819 Angiodysplasia of stomach and duodenum without bleeding: Secondary | ICD-10-CM

## 2016-07-21 MED ORDER — SODIUM CHLORIDE 0.9 % IV SOLN
Freq: Once | INTRAVENOUS | Status: AC
  Administered 2016-07-21: 08:00:00 via INTRAVENOUS

## 2016-07-21 MED ORDER — HEPARIN SOD (PORK) LOCK FLUSH 100 UNIT/ML IV SOLN
INTRAVENOUS | Status: AC
  Administered 2016-07-21: 13:00:00 500 [IU]
  Filled 2016-07-21: qty 5

## 2016-07-22 LAB — TYPE AND SCREEN
ABO/RH(D): A POS
Antibody Screen: NEGATIVE
UNIT DIVISION: 0
Unit division: 0

## 2016-07-22 LAB — BPAM RBC
BLOOD PRODUCT EXPIRATION DATE: 201806252359
BLOOD PRODUCT EXPIRATION DATE: 201806252359
ISSUE DATE / TIME: 201806120809
ISSUE DATE / TIME: 201806121019
UNIT TYPE AND RH: 6200
Unit Type and Rh: 6200

## 2016-07-27 ENCOUNTER — Other Ambulatory Visit: Payer: Self-pay | Admitting: Internal Medicine

## 2016-07-27 ENCOUNTER — Encounter: Payer: Self-pay | Admitting: Internal Medicine

## 2016-07-27 ENCOUNTER — Ambulatory Visit (INDEPENDENT_AMBULATORY_CARE_PROVIDER_SITE_OTHER): Payer: Medicare Other | Admitting: Internal Medicine

## 2016-07-27 ENCOUNTER — Ambulatory Visit (INDEPENDENT_AMBULATORY_CARE_PROVIDER_SITE_OTHER): Payer: Medicare Other | Admitting: Dietician

## 2016-07-27 ENCOUNTER — Other Ambulatory Visit (INDEPENDENT_AMBULATORY_CARE_PROVIDER_SITE_OTHER): Payer: Medicare Other

## 2016-07-27 DIAGNOSIS — K31819 Angiodysplasia of stomach and duodenum without bleeding: Secondary | ICD-10-CM

## 2016-07-27 DIAGNOSIS — N289 Disorder of kidney and ureter, unspecified: Secondary | ICD-10-CM | POA: Diagnosis not present

## 2016-07-27 DIAGNOSIS — K31811 Angiodysplasia of stomach and duodenum with bleeding: Secondary | ICD-10-CM

## 2016-07-27 DIAGNOSIS — I780 Hereditary hemorrhagic telangiectasia: Secondary | ICD-10-CM

## 2016-07-27 DIAGNOSIS — Z713 Dietary counseling and surveillance: Secondary | ICD-10-CM | POA: Diagnosis not present

## 2016-07-27 DIAGNOSIS — Z6825 Body mass index (BMI) 25.0-25.9, adult: Secondary | ICD-10-CM | POA: Diagnosis not present

## 2016-07-27 DIAGNOSIS — E1165 Type 2 diabetes mellitus with hyperglycemia: Principal | ICD-10-CM

## 2016-07-27 DIAGNOSIS — Z794 Long term (current) use of insulin: Secondary | ICD-10-CM | POA: Diagnosis not present

## 2016-07-27 DIAGNOSIS — E1121 Type 2 diabetes mellitus with diabetic nephropathy: Secondary | ICD-10-CM

## 2016-07-27 DIAGNOSIS — E118 Type 2 diabetes mellitus with unspecified complications: Secondary | ICD-10-CM

## 2016-07-27 DIAGNOSIS — E1129 Type 2 diabetes mellitus with other diabetic kidney complication: Secondary | ICD-10-CM | POA: Diagnosis not present

## 2016-07-27 DIAGNOSIS — R921 Mammographic calcification found on diagnostic imaging of breast: Secondary | ICD-10-CM

## 2016-07-27 LAB — SAMPLE TO BLOOD BANK

## 2016-07-27 LAB — CBC WITH DIFFERENTIAL/PLATELET
Basophils Absolute: 0 10*3/uL (ref 0.0–0.1)
Basophils Relative: 1 %
EOS PCT: 6 %
Eosinophils Absolute: 0.3 10*3/uL (ref 0.0–0.7)
HEMATOCRIT: 30.3 % — AB (ref 36.0–46.0)
Hemoglobin: 9.4 g/dL — ABNORMAL LOW (ref 12.0–15.0)
LYMPHS ABS: 0.4 10*3/uL — AB (ref 0.7–4.0)
LYMPHS PCT: 10 %
MCH: 29.2 pg (ref 26.0–34.0)
MCHC: 31 g/dL (ref 30.0–36.0)
MCV: 94.1 fL (ref 78.0–100.0)
MONO ABS: 0.4 10*3/uL (ref 0.1–1.0)
Monocytes Relative: 9 %
NEUTROS ABS: 3.2 10*3/uL (ref 1.7–7.7)
Neutrophils Relative %: 74 %
PLATELETS: 180 10*3/uL (ref 150–400)
RBC: 3.22 MIL/uL — AB (ref 3.87–5.11)
RDW: 14.4 % (ref 11.5–15.5)
WBC: 4.3 10*3/uL (ref 4.0–10.5)

## 2016-07-27 LAB — GLUCOSE, CAPILLARY: Glucose-Capillary: 178 mg/dL — ABNORMAL HIGH (ref 65–99)

## 2016-07-27 MED ORDER — INSULIN LISPRO 100 UNIT/ML (KWIKPEN): 5.0000 [IU] | PEN_INJECTOR | Freq: Three times a day (TID) | SUBCUTANEOUS | 2 refills | Status: DC

## 2016-07-27 MED ORDER — INSULIN GLARGINE 100 UNIT/ML SOLOSTAR PEN: 11.0000 [IU] | PEN_INJECTOR | Freq: Two times a day (BID) | SUBCUTANEOUS | 2 refills | Status: DC

## 2016-07-27 NOTE — Progress Notes (Signed)
  Medical Nutrition Therapy:  Appt start time: 2751 end time:  1115. Visit # 6  last series of MNT visits 06/2016  Assessment:  Primary concerns today: glycemic control Asked to see [name] for dietery evaluation and counseling to improve her blood sugars. She is known to Probation officer from MNT series last year. She eats well three meal a day. She has memory problems, husband assists with insulin administration.   ANTHROPOMETRICS: weight-144#,  BMI-25, this is appropriate and stable renetly. MEDICATIONS: take lantus 19 units two times a day, Novolog 2 units with each meal. TDD: 34 units/day.  BLOOD SUGAR:CGM download:  Checks before and after she eats.Blood sugar  pattern shows increasing blood sugars over the day. Basal blood sugars are ~ 30% in target.  DIETARY INTAKE: 930 am- ~45 g carb, some protein & fat 11am- ~ 45 gram carb 330 pm- 15-45 g carb  600 PM-700 pm 15-30 g carb, ice water  eats vegetables on sundays. [employer] sometimes green beans, salad, broccoli & cheese   3 bottles water every night;  2 cookies every now and then Meals on wheels- every day 10 AM; unclear what of this she eats and when after asking both she and her husband  Progress Towards Goal(s):  In progress.   Nutritional Diagnosis:  [place]-2.2 Altered nutrition-related laboratory As related to high pranidal blood sugar.  As evidenced by her meter& CGM download.    Intervention:  Nutrition education and counseling about blood sugars and eating consistent amounts and times Coordination of care: suggest decrease in basal dose  in am, increase Novolog with meals to flatten steep curve in pattern of glucose/decrease variability. Discussed with Dr. Marlowe Sax  Teaching Method Utilized: Visual, Auditory, Hands on Handouts given during visit include:AVS Barriers to learning/adherence to lifestyle change: support, memory issues Demonstrated degree of understanding via:  Teach Back   Monitoring/Evaluation:  Dietary intake, exercise,  meter, and body weight in 3 days  in office.  [place], Butch Penny, RD 07/27/2016 11:23 AM.

## 2016-07-27 NOTE — Progress Notes (Signed)
   CC: Patient is here for a follow-up of her diabetes.  HPI:  [name] is a 76 y.o. female with a past medical history of conditions listed below presenting to the clinic for a follow-up of her diabetes. Please see problem based charting for the status of the patient's current and chronic medical conditions.   Past Medical History:  Diagnosis Date  . CHF (congestive heart failure) ([place])   . Chronic anemia   . Chronic diastolic CHF (congestive heart failure) ([place]) 10/03/2013  . Chronic GI bleeding    [name] 11/29/2014  . Family history of anesthesia complication    "niece has a hard time coming out" (09/15/2012)  . Frequent nosebleeds    chronic  . Gastric AV malformation    [name] 11/29/2014  . GERD (gastroesophageal reflux disease)   . Heart murmur   . History of blood transfusion "several"  . History of epistaxis   . HTN (hypertension), benign 03/02/2012  . Hyperlipidemia   . Iron deficiency anemia    chronic infusions"  . Lichen planus    Both lower extremities  . 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 Pneumonia 1990's X 2  . Seizures ([place]) 09/2014  . Telangiectasia    Gastric   . 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    Review of Systems: Pertinent positives mentioned in HPI. Remainder of all ROS negative.   Physical Exam:  Vitals:   07/27/16 1146  BP: (!) 135/55  Pulse: (!) 50  Temp: 98.1 F (36.7 C)  TempSrc: Oral  SpO2: 98%  Weight: 145 lb 12.8 oz (66.1 kg)  Height: 5\' 3"  (1.6 m)   Physical Exam  Constitutional: She is oriented to person, place, and time. No distress.  HENT:  Head: Normocephalic and atraumatic.  Eyes: Right eye exhibits no discharge. Left eye exhibits no discharge.  Cardiovascular: Normal rate, regular rhythm and intact distal pulses.   Murmur heard. Pulmonary/Chest: Effort normal and breath sounds normal. No respiratory distress. She has no wheezes. She has no rales.  Abdominal: Soft. Bowel  sounds are normal. She exhibits no distension. There is no tenderness.  Musculoskeletal: She exhibits no edema.  Neurological: She is alert and oriented to person, place, and time.  Skin: Skin is warm and dry.    Assessment & Plan:   See Encounters Tab for problem based charting.  Patient discussed with Dr. Daryll Drown

## 2016-07-27 NOTE — Patient Instructions (Signed)
[name] it was nice seeing you today.  -Use Lantus 11 units twice daily  -Use Humalog 5 units 3 times a day with meals   -Return to the clinic on Thursday 07/30/2016 for your appointments with Dr. Juleen China and Butch Penny

## 2016-07-27 NOTE — Assessment & Plan Note (Signed)
[name] wore the CGM for 7 days. The average reading was 387, % time in target was 3, % time below target was 0, and % time above target was 97. Intervention will be to decrease the dose of her basal insulin and increase the dose of preprandial insulin to even out the basal to bolus ratio. Her total daily dose of insulin is 44 units. Patient is currently receiving 38 units basal insulin daily and 6 units preprandial daily. The patient will be scheduled to see Dr. Juleen China and Butch Penny for a final appointment.  Plan -Lantus 11 units twice daily -NovoLog 5 units 3 times a day with meals -Return to the clinic on 07/30/2016 to see Dr. Juleen China and Butch Penny

## 2016-07-29 ENCOUNTER — Telehealth: Payer: Self-pay | Admitting: *Deleted

## 2016-07-29 ENCOUNTER — Other Ambulatory Visit: Payer: Self-pay | Admitting: Oncology

## 2016-07-29 DIAGNOSIS — K31811 Angiodysplasia of stomach and duodenum with bleeding: Secondary | ICD-10-CM

## 2016-07-29 DIAGNOSIS — I780 Hereditary hemorrhagic telangiectasia: Secondary | ICD-10-CM

## 2016-07-29 DIAGNOSIS — D500 Iron deficiency anemia secondary to blood loss (chronic): Secondary | ICD-10-CM

## 2016-07-29 NOTE — Telephone Encounter (Signed)
Called pt - talked to her husband; informed "Hb OK. Let's see if we can get her on schedule for IV iron this week " per Dr [name]. Called Short Stay here at [facility] - appt scheduled for tomorrow @ 0900 AM. Pt's husband stated he will be able to bring pt tomorrow.

## 2016-07-29 NOTE — Telephone Encounter (Signed)
-----   Message from [name], MD sent at 07/27/2016 11:31 AM EDT ----- Call pt: Hb OK. Let's see if we can get her on schedule for IV iron this week  DrG

## 2016-07-30 ENCOUNTER — Encounter (HOSPITAL_COMMUNITY)
Admission: RE | Admit: 2016-07-30 | Discharge: 2016-07-30 | Disposition: A | Discharge status: Home / Self Care | Payer: Medicare Other | Source: Ambulatory Visit | Attending: Oncology | Admitting: Oncology

## 2016-07-30 ENCOUNTER — Ambulatory Visit: Payer: Medicare Other | Admitting: Dietician

## 2016-07-30 ENCOUNTER — Ambulatory Visit (INDEPENDENT_AMBULATORY_CARE_PROVIDER_SITE_OTHER): Payer: Medicare Other | Admitting: Internal Medicine

## 2016-07-30 ENCOUNTER — Encounter: Payer: Self-pay | Admitting: Internal Medicine

## 2016-07-30 VITALS — BP 127/37 | HR 66 | Temp 98.4°F | Wt 149.9 lb

## 2016-07-30 DIAGNOSIS — E1165 Type 2 diabetes mellitus with hyperglycemia: Principal | ICD-10-CM

## 2016-07-30 DIAGNOSIS — R040 Epistaxis: Secondary | ICD-10-CM | POA: Diagnosis not present

## 2016-07-30 DIAGNOSIS — E1121 Type 2 diabetes mellitus with diabetic nephropathy: Secondary | ICD-10-CM

## 2016-07-30 DIAGNOSIS — D500 Iron deficiency anemia secondary to blood loss (chronic): Secondary | ICD-10-CM

## 2016-07-30 DIAGNOSIS — K31811 Angiodysplasia of stomach and duodenum with bleeding: Secondary | ICD-10-CM

## 2016-07-30 DIAGNOSIS — I780 Hereditary hemorrhagic telangiectasia: Secondary | ICD-10-CM | POA: Diagnosis not present

## 2016-07-30 DIAGNOSIS — E1129 Type 2 diabetes mellitus with other diabetic kidney complication: Secondary | ICD-10-CM | POA: Diagnosis not present

## 2016-07-30 DIAGNOSIS — N289 Disorder of kidney and ureter, unspecified: Secondary | ICD-10-CM

## 2016-07-30 DIAGNOSIS — Z794 Long term (current) use of insulin: Secondary | ICD-10-CM

## 2016-07-30 MED ORDER — SALINE SPRAY 0.65 % NA SOLN: 1.0000 | NASAL | 5 refills | Status: DC | PRN

## 2016-07-30 MED ORDER — SODIUM CHLORIDE 0.9 % IV SOLN
510.0000 mg | INTRAVENOUS | Status: DC
  Administered 2016-07-30: 10:00:00 510 mg via INTRAVENOUS
  Filled 2016-07-30: qty 17

## 2016-07-30 MED ORDER — HEPARIN SOD (PORK) LOCK FLUSH 100 UNIT/ML IV SOLN
INTRAVENOUS | Status: AC
  Administered 2016-07-30: 500 [IU]
  Filled 2016-07-30: qty 5

## 2016-07-30 NOTE — Progress Notes (Signed)
I saw [name] to download her CGM and discussed results with Dr. Juleen China. She is to follow up with me on August 03, 2016 for her final CGM download.

## 2016-07-30 NOTE — Patient Instructions (Signed)
Thank you for coming to see me today. It was a pleasure. Today we talked about:   Continue Lantus 11 units twice per day. Continue Meal time insulin 5 units with meals  I have placed a referral for you to see an Ear Nose and Throat specialist for your nose bleeds.  Please also use Nasal Ocean Spray to keep your nasal passages clear.  Please follow-up with [place] on Monday June 25  If you have any questions or concerns, please do not hesitate to call the office at [phone].  Take Care,   [name], DO

## 2016-07-30 NOTE — Progress Notes (Signed)
Internal Medicine Clinic Attending  Case discussed with Dr. Rathoreat the time of the visit. We reviewed the resident's history and exam and pertinent patient test results. I agree with the assessment, diagnosis, and plan of care documented in the resident's note.  

## 2016-07-30 NOTE — Progress Notes (Signed)
CC: here for DM follow up.  Also complaining of frequent nosebleeds.  HPI:  [name] is a 76 y.o. woman with a past medical history listed below here today for follow up of her DM and with complaint of frequent nose bleeds..  For details of today's visit and the status of her chronic medical issues please refer to the assessment and plan.   Past Medical History:  Diagnosis Date  . CHF (congestive heart failure) ([place])   . Chronic anemia   . Chronic diastolic CHF (congestive heart failure) ([place]) 10/03/2013  . Chronic GI bleeding    [name] 11/29/2014  . Family history of anesthesia complication    "niece has a hard time coming out" (09/15/2012)  . Frequent nosebleeds    chronic  . Gastric AV malformation    [name] 11/29/2014  . GERD (gastroesophageal reflux disease)   . Heart murmur   . History of blood transfusion "several"  . History of epistaxis   . HTN (hypertension), benign 03/02/2012  . Hyperlipidemia   . Iron deficiency anemia    chronic infusions"  . Lichen planus    Both lower extremities  . 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 Pneumonia 1990's X 2  . Seizures ([place]) 09/2014  . Telangiectasia    Gastric   . 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    Review of Systems:  Please see pertinent ROS reviewed in HPI and problem based charting.   Physical Exam:  Vitals:   07/30/16 1434  BP: (!) 127/37  Pulse: 66  Temp: 98.4 F (36.9 C)  TempSrc: Oral  SpO2: 98%  Weight: 149 lb 14.4 oz (68 kg)   Physical Exam  Constitutional: She appears well-developed and well-nourished. No distress.  HENT:  Head: Normocephalic and atraumatic.  Nose: Nose normal.  No current bleeding.  No dry blood in her nares.  Cardiovascular: Normal rate and regular rhythm.   Murmur heard. Pulmonary/Chest: Effort normal.  Skin: Skin is warm and dry.  Psychiatric: She has a normal mood and affect.     Assessment & Plan:   See Encounters Tab  for problem based charting.  Patient discussed with Dr. Daryll Drown .  Epistaxis Assessment: Patient reports about 1 week of daily nose bleeds occurring twice per day.  Bleeding controlled with applying pressure with rag and cold ice and then applying vaseline to her nares.  She reports prior history of epixtaxis and had surgery years ago.  Her surgical history does note a surgery for nasal hemorrhage control but she does not recall who performed this or when other then stating it was several years ago.  Plan: - recommended Ocean saline nasal spray to keep her nares moist. - considered Afrin but given her memory impairment I was concerned about accidental overuse. - referral placed to ENT  - return precautions given.  DM (diabetes mellitus), type 2, uncontrolled, with renal complications ([place]) Assessment: [name] has continued to wear her CGM since follow up on 6/18.  At that time, her Lantus was changed to 11 units BID and her Novolog was 3 units TID with meals.  She has shown improvement since that time.  On 6/19 her average glucose was 287 and on 6/20 it was 240.  Today, her average is 401 but she did not take her insulin as she forgot and had to receive an iron infusion.  Plan: - continue current insulin doses of Lantus 11units BID  and Novolog 5 units TID. - RTC Monday 6/25 for further titration based on CGM after allowing for several days at this dose

## 2016-07-30 NOTE — Assessment & Plan Note (Signed)
Assessment: Patient reports about 1 week of daily nose bleeds occurring twice per day.  Bleeding controlled with applying pressure with rag and cold ice and then applying vaseline to her nares.  She reports prior history of epixtaxis and had surgery years ago.  Her surgical history does note a surgery for nasal hemorrhage control but she does not recall who performed this or when other then stating it was several years ago.  Plan: - recommended Ocean saline nasal spray to keep her nares moist. - considered Afrin but given her memory impairment I was concerned about accidental overuse. - referral placed to ENT  - return precautions given.

## 2016-07-30 NOTE — Assessment & Plan Note (Signed)
Assessment: [name] has continued to wear her CGM since follow up on 6/18.  At that time, her Lantus was changed to 11 units BID and her Novolog was 3 units TID with meals.  She has shown improvement since that time.  On 6/19 her average glucose was 287 and on 6/20 it was 240.  Today, her average is 401 but she did not take her insulin as she forgot and had to receive an iron infusion.  Plan: - continue current insulin doses of Lantus 11units BID and Novolog 5 units TID. - RTC Monday 6/25 for further titration based on CGM after allowing for several days at this dose

## 2016-08-02 NOTE — Progress Notes (Signed)
Internal Medicine Clinic Attending  Case discussed with Dr. Wallace at the time of the visit.  We reviewed the resident's history and exam and pertinent patient test results.  I agree with the assessment, diagnosis, and plan of care documented in the resident's note.  

## 2016-08-03 ENCOUNTER — Ambulatory Visit (INDEPENDENT_AMBULATORY_CARE_PROVIDER_SITE_OTHER): Payer: Medicare Other | Admitting: Internal Medicine

## 2016-08-03 ENCOUNTER — Encounter: Payer: Self-pay | Admitting: Internal Medicine

## 2016-08-03 ENCOUNTER — Other Ambulatory Visit: Payer: Self-pay | Admitting: Cardiology

## 2016-08-03 ENCOUNTER — Other Ambulatory Visit: Payer: Medicare Other

## 2016-08-03 VITALS — BP 134/40 | HR 62 | Temp 98.1°F | Ht 63.0 in | Wt 149.3 lb

## 2016-08-03 DIAGNOSIS — I780 Hereditary hemorrhagic telangiectasia: Secondary | ICD-10-CM | POA: Diagnosis not present

## 2016-08-03 DIAGNOSIS — E1165 Type 2 diabetes mellitus with hyperglycemia: Principal | ICD-10-CM

## 2016-08-03 DIAGNOSIS — N289 Disorder of kidney and ureter, unspecified: Secondary | ICD-10-CM

## 2016-08-03 DIAGNOSIS — E1129 Type 2 diabetes mellitus with other diabetic kidney complication: Secondary | ICD-10-CM | POA: Diagnosis not present

## 2016-08-03 DIAGNOSIS — Z794 Long term (current) use of insulin: Secondary | ICD-10-CM

## 2016-08-03 DIAGNOSIS — E1121 Type 2 diabetes mellitus with diabetic nephropathy: Secondary | ICD-10-CM

## 2016-08-03 LAB — CBC WITH DIFFERENTIAL/PLATELET
Basophils Absolute: 0 10*3/uL (ref 0.0–0.1)
Basophils Relative: 1 %
EOS ABS: 0.3 10*3/uL (ref 0.0–0.7)
Eosinophils Relative: 8 %
HEMATOCRIT: 30.1 % — AB (ref 36.0–46.0)
HEMOGLOBIN: 9.3 g/dL — AB (ref 12.0–15.0)
LYMPHS ABS: 0.5 10*3/uL — AB (ref 0.7–4.0)
Lymphocytes Relative: 12 %
MCH: 29.6 pg (ref 26.0–34.0)
MCHC: 30.9 g/dL (ref 30.0–36.0)
MCV: 95.9 fL (ref 78.0–100.0)
MONOS PCT: 7 %
Monocytes Absolute: 0.3 10*3/uL (ref 0.1–1.0)
NEUTROS PCT: 72 %
Neutro Abs: 3.2 10*3/uL (ref 1.7–7.7)
Platelets: 212 10*3/uL (ref 150–400)
RBC: 3.14 MIL/uL — ABNORMAL LOW (ref 3.87–5.11)
RDW: 15.1 % (ref 11.5–15.5)
WBC: 4.4 10*3/uL (ref 4.0–10.5)

## 2016-08-03 LAB — SAMPLE TO BLOOD BANK

## 2016-08-03 MED ORDER — INSULIN LISPRO 100 UNIT/ML (KWIKPEN): 6.0000 [IU] | PEN_INJECTOR | Freq: Three times a day (TID) | SUBCUTANEOUS | 3 refills | Status: DC

## 2016-08-03 MED ORDER — INSULIN GLARGINE 100 UNIT/ML SOLOSTAR PEN: 13.0000 [IU] | PEN_INJECTOR | Freq: Two times a day (BID) | SUBCUTANEOUS | 2 refills | Status: DC

## 2016-08-03 NOTE — Patient Instructions (Signed)
[name] it was nice seeing you today.   -Dose of insulin has been increased:  Use Lantus 13 units twice daily  Use Humalog 6 units 3 times a day with meals   -Check your blood sugar 3 times a day before eating  -Return to the clinic in 4 weeks with your meter

## 2016-08-04 ENCOUNTER — Telehealth: Payer: Self-pay | Admitting: *Deleted

## 2016-08-04 NOTE — Telephone Encounter (Signed)
-----   Message from [name], MD sent at 08/03/2016  2:50 PM EDT ----- Call pt: Hb stable. No blood this week but I believe she is scheduled for another dose of iron

## 2016-08-04 NOTE — Progress Notes (Signed)
Internal Medicine Clinic Attending  Case discussed with Dr. Rathoreat the time of the visit. We reviewed the resident's history and exam and pertinent patient test results. I agree with the assessment, diagnosis, and plan of care documented in the resident's note.  

## 2016-08-04 NOTE — Assessment & Plan Note (Addendum)
[name] wore the CGM for 15 days. The average reading was 356, % time in target was 8, % time below target was 0, and % time above target was 92. Intervention will be to increase dose of basal insulin to 13 u BID and pre-prandial insulin to 6 u TID. Her husband and [facility] are care are helping her with medication management. -Advised her to check her blood glucose 3 times a day before eating -Return to the clinic in 4 weeks

## 2016-08-04 NOTE — Telephone Encounter (Signed)
Called pt - talked to Mr. Nofsinger; informed "Hb stable. No blood this week " per Dr [name]. She does not have another IV iron schedule for this week.

## 2016-08-04 NOTE — Progress Notes (Signed)
   CC: Patient is here for a follow-up of her diabetes.  HPI:  [name] is a 76 y.o. female with a past medical history of conditions listed below presenting to the clinic for a follow-up of her diabetes. Please see problem based charting for the status of the patient's current and chronic medical conditions.   Past Medical History:  Diagnosis Date  . CHF (congestive heart failure) ([place])   . Chronic anemia   . Chronic diastolic CHF (congestive heart failure) ([place]) 10/03/2013  . Chronic GI bleeding    [name] 11/29/2014  . Family history of anesthesia complication    "niece has a hard time coming out" (09/15/2012)  . Frequent nosebleeds    chronic  . Gastric AV malformation    [name] 11/29/2014  . GERD (gastroesophageal reflux disease)   . Heart murmur   . History of blood transfusion "several"  . History of epistaxis   . HTN (hypertension), benign 03/02/2012  . Hyperlipidemia   . Iron deficiency anemia    chronic infusions"  . Lichen planus    Both lower extremities  . 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 Pneumonia 1990's X 2  . Seizures ([place]) 09/2014  . Telangiectasia    Gastric   . 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    Review of Systems: Pertinent positives mentioned in HPI. Remainder of all ROS negative.   Physical Exam:  Vitals:   08/03/16 1342  BP: (!) 134/40  Pulse: 62  Temp: 98.1 F (36.7 C)  TempSrc: Oral  SpO2: 97%  Weight: 149 lb 4.8 oz (67.7 kg)  Height: 5\' 3"  (1.6 m)   Physical Exam  Constitutional: She is oriented to person, place, and time. She appears well-developed and well-nourished. No distress.  HENT:  Head: Normocephalic and atraumatic.  Eyes: Right eye exhibits no discharge. Left eye exhibits no discharge.  Cardiovascular: Normal rate, regular rhythm and intact distal pulses.   Pulmonary/Chest: Effort normal and breath sounds normal. No respiratory distress. She has no wheezes. She has no  rales.  Abdominal: Soft. Bowel sounds are normal. She exhibits no distension. There is no tenderness.  Musculoskeletal: She exhibits no edema.  Neurological: She is alert and oriented to person, place, and time.  Skin: Skin is warm and dry.    Assessment & Plan:   See Encounters Tab for problem based charting.  Patient discussed with Dr. Daryll Drown

## 2016-08-10 ENCOUNTER — Other Ambulatory Visit (INDEPENDENT_AMBULATORY_CARE_PROVIDER_SITE_OTHER): Payer: Medicare Other

## 2016-08-10 DIAGNOSIS — I780 Hereditary hemorrhagic telangiectasia: Secondary | ICD-10-CM

## 2016-08-10 DIAGNOSIS — K31819 Angiodysplasia of stomach and duodenum without bleeding: Secondary | ICD-10-CM

## 2016-08-10 DIAGNOSIS — K31811 Angiodysplasia of stomach and duodenum with bleeding: Secondary | ICD-10-CM

## 2016-08-10 LAB — CBC WITH DIFFERENTIAL/PLATELET
BASOS PCT: 0 %
Basophils Absolute: 0 10*3/uL (ref 0.0–0.1)
EOS ABS: 0.3 10*3/uL (ref 0.0–0.7)
Eosinophils Relative: 7 %
HEMATOCRIT: 30.9 % — AB (ref 36.0–46.0)
Hemoglobin: 10 g/dL — ABNORMAL LOW (ref 12.0–15.0)
Lymphocytes Relative: 15 %
Lymphs Abs: 0.7 10*3/uL (ref 0.7–4.0)
MCH: 30.6 pg (ref 26.0–34.0)
MCHC: 32.4 g/dL (ref 30.0–36.0)
MCV: 94.5 fL (ref 78.0–100.0)
MONOS PCT: 6 %
Monocytes Absolute: 0.3 10*3/uL (ref 0.1–1.0)
NEUTROS ABS: 3.2 10*3/uL (ref 1.7–7.7)
NEUTROS PCT: 72 %
Platelets: 159 10*3/uL (ref 150–400)
RBC: 3.27 MIL/uL — AB (ref 3.87–5.11)
RDW: 14.9 % (ref 11.5–15.5)
WBC: 4.5 10*3/uL (ref 4.0–10.5)

## 2016-08-10 LAB — SAMPLE TO BLOOD BANK

## 2016-08-13 ENCOUNTER — Other Ambulatory Visit: Payer: Self-pay | Admitting: Oncology

## 2016-08-13 DIAGNOSIS — K31819 Angiodysplasia of stomach and duodenum without bleeding: Secondary | ICD-10-CM

## 2016-08-13 DIAGNOSIS — I780 Hereditary hemorrhagic telangiectasia: Secondary | ICD-10-CM

## 2016-08-17 ENCOUNTER — Telehealth: Payer: Self-pay | Admitting: *Deleted

## 2016-08-17 ENCOUNTER — Ambulatory Visit (INDEPENDENT_AMBULATORY_CARE_PROVIDER_SITE_OTHER): Payer: Medicare Other | Admitting: Dietician

## 2016-08-17 ENCOUNTER — Other Ambulatory Visit: Payer: Self-pay | Admitting: Oncology

## 2016-08-17 ENCOUNTER — Other Ambulatory Visit: Payer: Self-pay | Admitting: Internal Medicine

## 2016-08-17 ENCOUNTER — Other Ambulatory Visit (INDEPENDENT_AMBULATORY_CARE_PROVIDER_SITE_OTHER): Payer: Medicare Other

## 2016-08-17 DIAGNOSIS — I780 Hereditary hemorrhagic telangiectasia: Secondary | ICD-10-CM

## 2016-08-17 DIAGNOSIS — M62838 Other muscle spasm: Secondary | ICD-10-CM

## 2016-08-17 DIAGNOSIS — E118 Type 2 diabetes mellitus with unspecified complications: Secondary | ICD-10-CM | POA: Diagnosis not present

## 2016-08-17 DIAGNOSIS — Z713 Dietary counseling and surveillance: Secondary | ICD-10-CM

## 2016-08-17 DIAGNOSIS — Z794 Long term (current) use of insulin: Secondary | ICD-10-CM

## 2016-08-17 DIAGNOSIS — K31819 Angiodysplasia of stomach and duodenum without bleeding: Secondary | ICD-10-CM

## 2016-08-17 DIAGNOSIS — K31811 Angiodysplasia of stomach and duodenum with bleeding: Secondary | ICD-10-CM

## 2016-08-17 DIAGNOSIS — E1165 Type 2 diabetes mellitus with hyperglycemia: Principal | ICD-10-CM

## 2016-08-17 DIAGNOSIS — E1121 Type 2 diabetes mellitus with diabetic nephropathy: Secondary | ICD-10-CM

## 2016-08-17 DIAGNOSIS — D500 Iron deficiency anemia secondary to blood loss (chronic): Secondary | ICD-10-CM

## 2016-08-17 LAB — CBC WITH DIFFERENTIAL/PLATELET
BASOS PCT: 1 %
Basophils Absolute: 0 10*3/uL (ref 0.0–0.1)
EOS ABS: 0.4 10*3/uL (ref 0.0–0.7)
Eosinophils Relative: 7 %
HEMATOCRIT: 26.7 % — AB (ref 36.0–46.0)
HEMOGLOBIN: 8.6 g/dL — AB (ref 12.0–15.0)
LYMPHS ABS: 0.6 10*3/uL — AB (ref 0.7–4.0)
Lymphocytes Relative: 11 %
MCH: 31.3 pg (ref 26.0–34.0)
MCHC: 32.2 g/dL (ref 30.0–36.0)
MCV: 97.1 fL (ref 78.0–100.0)
MONOS PCT: 8 %
Monocytes Absolute: 0.5 10*3/uL (ref 0.1–1.0)
NEUTROS ABS: 4 10*3/uL (ref 1.7–7.7)
NEUTROS PCT: 73 %
Platelets: 167 10*3/uL (ref 150–400)
RBC: 2.75 MIL/uL — AB (ref 3.87–5.11)
RDW: 15.2 % (ref 11.5–15.5)
WBC: 5.5 10*3/uL (ref 4.0–10.5)

## 2016-08-17 LAB — SAMPLE TO BLOOD BANK

## 2016-08-17 NOTE — Patient Instructions (Addendum)
Your INSULIN doses as of August 17, 2016  In the morning- inject 13 units Lantus  and 6 units Humalog before breakfast  Before Lunch- inject 6 units Humalog  Before dinner inject 6 units  Humalog   Inject 13 units lantus before bed about 9 PM  Check blood sugar before you take insulin 3 times a day    Put a check in the box when you have taken the insulin dose listed.   date  13 units Lantus before breakfast   6 units Humalog before breakfast 6 units Humalog before lunch  6 units Humalog before dinner 13 units Lantus about 9 PM  7-9/18 Monday       08-18-16 Tuesday       08-19-16 Wednesday       08-20-16 Thursday       08-21-16 Friday       08-22-16 Saturday       08-23-16 Sunday       Monday, 08-24-16       Tuesday, 08-25-16       Wednesday, 08-26-16       Thursday, 08-27-16

## 2016-08-17 NOTE — Telephone Encounter (Signed)
Thanks. I put in orders for lab

## 2016-08-17 NOTE — Telephone Encounter (Signed)
Alden Server, [facility] - appt schedule for Monday the 16th @ 0800 AM for blood transfusion. Called pt - informed to come to [facility] for labs on Friday the 13th then blood transfusion on Monday the 16th. She voiced understanding and repeated instructions - husband informed also.

## 2016-08-17 NOTE — Telephone Encounter (Signed)
-----   Message from [name], MD sent at 08/17/2016  1:39 PM EDT ----- Call pt: Hb 8.6 will need blood again by next week - please line up a bed for next Monday at short stay

## 2016-08-17 NOTE — Progress Notes (Signed)
  Medical Nutrition Therapy:  Appt start time: 3704 end time:  1125. Visit # 7  last series of MNT visits 06/2016  Assessment:  Primary concerns today: glycemic control Met briefly with [name] today to assist with blood sugar control.  She has memory problems, husband assists with insulin administration and confirms that she is getting lantus and Novolog as ordered. She has had not low blood sugars.  ANTHROPOMETRICS:  appropriate and stable recently. MEDICATIONS: takes lantus 13 units two times a day, Novolog 6 units with each meal. TDD: 44 units/day. 26 basal and 18 bolus BLOOD SUGAR:High today-410 fasting this am. 352 after breakfast and 6 units Humalog. She has three meters with her today and says she uses a different one if she cannot find the one she usually uses. The download from today shows about one time a day checking and for the past week all above 180 mg/dl. Her pattern shows a general  increasing as the day progresses. DIETARY INTAKE: Not done in detail today. She reports appetite and food intake about the same and good.   Progress Towards Goal(s):  In progress.   Nutritional Diagnosis:  [place]-2.2 Altered nutrition-related laboratory As related to high pranidal blood sugar.  As evidenced by her meter data    Intervention:  Nutrition education and counseling about blood sugars and taking insulin with meals and on time. Coordination of care: recommend increasing prandial insulin to 7 units three times a day. Teaching Method Utilized: Visual, Auditory, Hands on Handouts given during visit include:AVS Barriers to learning/adherence to lifestyle change: support, memory issues Demonstrated degree of understanding via:  Teach Back   Monitoring/Evaluation:  Dietary intake, exercise, meter, and body weight in 3 week  in office.  [name], Butch Penny, RD 08/18/2016 3:56 PM.

## 2016-08-21 ENCOUNTER — Telehealth: Payer: Self-pay | Admitting: Dietician

## 2016-08-21 ENCOUNTER — Telehealth: Payer: Self-pay | Admitting: *Deleted

## 2016-08-21 ENCOUNTER — Other Ambulatory Visit (INDEPENDENT_AMBULATORY_CARE_PROVIDER_SITE_OTHER): Payer: Medicare Other

## 2016-08-21 ENCOUNTER — Other Ambulatory Visit: Payer: Self-pay | Admitting: Internal Medicine

## 2016-08-21 ENCOUNTER — Other Ambulatory Visit (HOSPITAL_COMMUNITY): Payer: Self-pay | Admitting: *Deleted

## 2016-08-21 DIAGNOSIS — E1121 Type 2 diabetes mellitus with diabetic nephropathy: Secondary | ICD-10-CM

## 2016-08-21 DIAGNOSIS — I780 Hereditary hemorrhagic telangiectasia: Secondary | ICD-10-CM

## 2016-08-21 DIAGNOSIS — E1165 Type 2 diabetes mellitus with hyperglycemia: Principal | ICD-10-CM

## 2016-08-21 DIAGNOSIS — D500 Iron deficiency anemia secondary to blood loss (chronic): Secondary | ICD-10-CM

## 2016-08-21 LAB — CBC WITH DIFFERENTIAL/PLATELET
BASOS ABS: 0 10*3/uL (ref 0.0–0.1)
BASOS PCT: 1 %
EOS PCT: 7 %
Eosinophils Absolute: 0.4 10*3/uL (ref 0.0–0.7)
HCT: 27.4 % — ABNORMAL LOW (ref 36.0–46.0)
Hemoglobin: 8.8 g/dL — ABNORMAL LOW (ref 12.0–15.0)
Lymphocytes Relative: 14 %
Lymphs Abs: 0.7 10*3/uL (ref 0.7–4.0)
MCH: 31.2 pg (ref 26.0–34.0)
MCHC: 32.1 g/dL (ref 30.0–36.0)
MCV: 97.2 fL (ref 78.0–100.0)
MONO ABS: 0.3 10*3/uL (ref 0.1–1.0)
Monocytes Relative: 6 %
NEUTROS ABS: 3.7 10*3/uL (ref 1.7–7.7)
Neutrophils Relative %: 72 %
PLATELETS: 161 10*3/uL (ref 150–400)
RBC: 2.82 MIL/uL — ABNORMAL LOW (ref 3.87–5.11)
RDW: 15.1 % (ref 11.5–15.5)
WBC: 5.1 10*3/uL (ref 4.0–10.5)

## 2016-08-21 LAB — SAMPLE TO BLOOD BANK

## 2016-08-21 MED ORDER — INSULIN LISPRO 100 UNIT/ML (KWIKPEN): 7.0000 [IU] | PEN_INJECTOR | Freq: Three times a day (TID) | SUBCUTANEOUS | 3 refills | Status: DC

## 2016-08-21 NOTE — Telephone Encounter (Signed)
Received an inbasket  request from Dr. Juleen China to inform patient to increase her Humalog to 7 units before each meal. I called and spoke to her husband, [name],  who oversees her insulin administration: he verbalized understanding to having her increase her meal time insulin to 7 units three times a day starting tonight.

## 2016-08-21 NOTE — Telephone Encounter (Signed)
Pt called / informed hgb holding @ 8.8 and no transfusion on Monday; also keep her appt on 7/23 for office visit and labs. Voiced understanding. Also call [place] - left message for [name] to cancel pt's appt on Monday the 16th.

## 2016-08-21 NOTE — Telephone Encounter (Signed)
-----   Message from [name], MD sent at 08/21/2016  4:38 PM EDT ----- Blood count is holding at 8.8, was 8.6 on Monday, so we do not need to transfuse Monday. Continue weekly blood counts.She can skip this Monday since she was in today. Resume lab on 7/23

## 2016-08-24 ENCOUNTER — Other Ambulatory Visit: Payer: Medicare Other

## 2016-08-24 ENCOUNTER — Inpatient Hospital Stay (HOSPITAL_COMMUNITY): Admission: RE | Admit: 2016-08-24 | Payer: Medicare Other | Source: Ambulatory Visit

## 2016-08-24 ENCOUNTER — Other Ambulatory Visit: Payer: Self-pay | Admitting: Cardiology

## 2016-08-26 ENCOUNTER — Telehealth: Payer: Self-pay | Admitting: Adult Health

## 2016-08-26 ENCOUNTER — Other Ambulatory Visit: Payer: Self-pay

## 2016-08-26 MED ORDER — POTASSIUM CHLORIDE CRYS ER 20 MEQ PO TBCR: EXTENDED_RELEASE_TABLET | ORAL | 0 refills | Status: DC

## 2016-08-26 NOTE — Telephone Encounter (Signed)
Pt's husband called requesting a refill for KLOR-CON. Informed him that she has not been seen since 2016 and that we have left numerous messages for the pharmacy to inform pt that an appt was required for additional refills. He stated she would make an appt so I transferred call to scheduling. Appt made for 09/17/16 @ 3pm with Babs Sciara, PA. I refilled to [facility] for #30 with no refills. Called pt and confirm pharmacy with her husband [name]. He expressed appreciation.

## 2016-08-31 ENCOUNTER — Ambulatory Visit (INDEPENDENT_AMBULATORY_CARE_PROVIDER_SITE_OTHER): Payer: Medicare Other | Admitting: Internal Medicine

## 2016-08-31 ENCOUNTER — Other Ambulatory Visit: Payer: Medicare Other

## 2016-08-31 DIAGNOSIS — Z794 Long term (current) use of insulin: Secondary | ICD-10-CM

## 2016-08-31 DIAGNOSIS — E1165 Type 2 diabetes mellitus with hyperglycemia: Secondary | ICD-10-CM | POA: Diagnosis not present

## 2016-08-31 DIAGNOSIS — I780 Hereditary hemorrhagic telangiectasia: Secondary | ICD-10-CM

## 2016-08-31 DIAGNOSIS — E1121 Type 2 diabetes mellitus with diabetic nephropathy: Secondary | ICD-10-CM

## 2016-08-31 DIAGNOSIS — K31819 Angiodysplasia of stomach and duodenum without bleeding: Secondary | ICD-10-CM

## 2016-08-31 DIAGNOSIS — K31811 Angiodysplasia of stomach and duodenum with bleeding: Secondary | ICD-10-CM

## 2016-08-31 LAB — CBC WITH DIFFERENTIAL/PLATELET
BASOS PCT: 0 %
Basophils Absolute: 0 10*3/uL (ref 0.0–0.1)
EOS ABS: 0.3 10*3/uL (ref 0.0–0.7)
Eosinophils Relative: 6 %
HCT: 26.5 % — ABNORMAL LOW (ref 36.0–46.0)
HEMOGLOBIN: 8.2 g/dL — AB (ref 12.0–15.0)
Lymphocytes Relative: 13 %
Lymphs Abs: 0.7 10*3/uL (ref 0.7–4.0)
MCH: 29.9 pg (ref 26.0–34.0)
MCHC: 30.9 g/dL (ref 30.0–36.0)
MCV: 96.7 fL (ref 78.0–100.0)
Monocytes Absolute: 0.5 10*3/uL (ref 0.1–1.0)
Monocytes Relative: 9 %
NEUTROS ABS: 3.7 10*3/uL (ref 1.7–7.7)
NEUTROS PCT: 72 %
Platelets: 200 10*3/uL (ref 150–400)
RBC: 2.74 MIL/uL — AB (ref 3.87–5.11)
RDW: 14.3 % (ref 11.5–15.5)
WBC: 5.2 10*3/uL (ref 4.0–10.5)

## 2016-08-31 LAB — SAMPLE TO BLOOD BANK

## 2016-08-31 MED ORDER — INSULIN GLARGINE 100 UNIT/ML SOLOSTAR PEN: 16.0000 [IU] | PEN_INJECTOR | Freq: Two times a day (BID) | SUBCUTANEOUS | 2 refills | Status: DC

## 2016-08-31 NOTE — Telephone Encounter (Signed)
error 

## 2016-08-31 NOTE — Patient Instructions (Addendum)
[name],   For your diabetes  START taking Lantus 16 units twice per day  CONTINUE taking humalog 7 units three times per day with meals   [name] will call you in 2 weeks to follow up  Schedule and appointment to see Dr. Juleen China in two months  Call the clinic if you need anything

## 2016-08-31 NOTE — Progress Notes (Signed)
   CC: follow up of diabetes mellitus  HPI:  [name] is a 76 y.o. with PMH as listed below who presents for follow up of uncontrolled type 2 DM. Please see the assessment and plans for the status of the patient chronic medical problems.    Past Medical History:  Diagnosis Date  . CHF (congestive heart failure) ([place])   . Chronic anemia   . Chronic diastolic CHF (congestive heart failure) ([place]) 10/03/2013  . Chronic GI bleeding    [name] 11/29/2014  . Family history of anesthesia complication    "niece has a hard time coming out" (09/15/2012)  . Frequent nosebleeds    chronic  . Gastric AV malformation    [name] 11/29/2014  . GERD (gastroesophageal reflux disease)   . Heart murmur   . History of blood transfusion "several"  . History of epistaxis   . HTN (hypertension), benign 03/02/2012  . Hyperlipidemia   . Iron deficiency anemia    chronic infusions"  . Lichen planus    Both lower extremities  . 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 Pneumonia 1990's X 2  . Seizures ([place]) 09/2014  . Telangiectasia    Gastric   . 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   Review of Systems:  Refer to history of present illness and assessment and plans for pertinent review of systems, all others reviewed and negative  Physical Exam:  Vitals:   08/31/16 1129  BP: (!) 127/33  Pulse: 62  Temp: 98 F (36.7 C)  TempSrc: Oral  SpO2: 99%  Weight: 151 lb (68.5 kg)  Height: 5\' 3"  (1.6 m)   Physical Exam  Constitutional: She is well-developed, well-nourished, and in no distress. No distress.  HENT:  Head: Normocephalic and atraumatic.  Cardiovascular: Normal rate and regular rhythm.   No murmur heard. Pulmonary/Chest: Effort normal. No respiratory distress. She has no wheezes. She has no rales.  Neurological: She is alert.  Skin: Skin is warm and dry. She is not diaphoretic.  Psychiatric: Affect and judgment normal.    Assessment & Plan:    Type 2 DM  She presents for follow-up of type 2 diabetes. She brings her meter today which shows an average blood glucose of 338. The averages are: prior to breakfast is 244, after breakfast is 270, before lunch 295, after lunch 335, before dinner 418, after dinner 401, prior to bedtime 404. He has no hypoglycemic readings. She denies any symptoms of hypoglycemia. Denies polydipsia or polyuria or abdominal pain. - Continue humalog 7 units TID  - Increase lantus to 16 units BID ( was previously 13 units BID)  - Follow up call with diabetes educator in 2 weeks   See Encounters Tab for problem based charting.  Patient discussed with Dr. Lynnae January

## 2016-09-01 ENCOUNTER — Other Ambulatory Visit (HOSPITAL_COMMUNITY): Payer: Self-pay | Admitting: *Deleted

## 2016-09-01 ENCOUNTER — Telehealth: Payer: Self-pay | Admitting: *Deleted

## 2016-09-01 ENCOUNTER — Other Ambulatory Visit: Payer: Self-pay | Admitting: Oncology

## 2016-09-01 DIAGNOSIS — I780 Hereditary hemorrhagic telangiectasia: Secondary | ICD-10-CM

## 2016-09-01 DIAGNOSIS — D500 Iron deficiency anemia secondary to blood loss (chronic): Secondary | ICD-10-CM

## 2016-09-01 DIAGNOSIS — K31811 Angiodysplasia of stomach and duodenum with bleeding: Secondary | ICD-10-CM

## 2016-09-01 LAB — PREPARE RBC (CROSSMATCH)

## 2016-09-01 NOTE — Addendum Note (Signed)
Addended by: Ebbie Latus on: 09/01/2016 10:39 AM   Modules accepted: Orders

## 2016-09-01 NOTE — Telephone Encounter (Signed)
Talked to [facility] in medical day - has available appt for tomorrow morning for transfusion.  Called pt - talked to Mr Motta - informed pt's "blood count 8.2. She will need blood transfusion by next week" per Dr [name].  Informed appt tomorrow @ 0900 AM here [facility]; stated ok.

## 2016-09-01 NOTE — Progress Notes (Signed)
Internal Medicine Clinic Attending  Case discussed with Dr. Blum at the time of the visit.  We reviewed the resident's history and exam and pertinent patient test results.  I agree with the assessment, diagnosis, and plan of care documented in the resident's note. 

## 2016-09-01 NOTE — Telephone Encounter (Signed)
-----   Message from [name], MD sent at 09/01/2016  8:59 AM EDT ----- Call pt: blood count 8.2. She will need blood transfusion by next week. Let's line up a bed for her. Friday if available otherwise next Monday. Let me know & I will put in orders

## 2016-09-02 ENCOUNTER — Ambulatory Visit (HOSPITAL_COMMUNITY)
Admission: RE | Admit: 2016-09-02 | Discharge: 2016-09-02 | Disposition: A | Discharge status: Home / Self Care | Payer: Medicare Other | Source: Ambulatory Visit | Attending: Oncology | Admitting: Oncology

## 2016-09-02 DIAGNOSIS — D500 Iron deficiency anemia secondary to blood loss (chronic): Secondary | ICD-10-CM

## 2016-09-02 DIAGNOSIS — K31811 Angiodysplasia of stomach and duodenum with bleeding: Secondary | ICD-10-CM | POA: Diagnosis not present

## 2016-09-02 DIAGNOSIS — I780 Hereditary hemorrhagic telangiectasia: Secondary | ICD-10-CM | POA: Diagnosis not present

## 2016-09-02 MED ORDER — HEPARIN SOD (PORK) LOCK FLUSH 100 UNIT/ML IV SOLN
INTRAVENOUS | Status: AC
  Filled 2016-09-02: qty 5

## 2016-09-02 MED ORDER — SODIUM CHLORIDE 0.9 % IV SOLN
Freq: Once | INTRAVENOUS | Status: AC
  Administered 2016-09-02: 10:00:00 via INTRAVENOUS

## 2016-09-02 MED ORDER — SODIUM CHLORIDE 0.9 % IV SOLN
510.0000 mg | INTRAVENOUS | Status: AC
  Administered 2016-09-02: 510 mg via INTRAVENOUS
  Filled 2016-09-02: qty 17

## 2016-09-02 MED ORDER — HEPARIN SOD (PORK) LOCK FLUSH 100 UNIT/ML IV SOLN
500.0000 [IU] | Freq: Once | INTRAVENOUS | Status: AC
  Administered 2016-09-02: 500 [IU] via INTRAVENOUS

## 2016-09-02 MED ORDER — SODIUM CHLORIDE 0.9 % IV SOLN: Freq: Once | INTRAVENOUS | Status: DC

## 2016-09-03 LAB — TYPE AND SCREEN
ABO/RH(D): A POS
ANTIBODY SCREEN: NEGATIVE
UNIT DIVISION: 0

## 2016-09-03 LAB — BPAM RBC
Blood Product Expiration Date: 201808092359
ISSUE DATE / TIME: 201807250940
UNIT TYPE AND RH: 6200

## 2016-09-07 ENCOUNTER — Other Ambulatory Visit: Payer: Self-pay | Admitting: Internal Medicine

## 2016-09-07 ENCOUNTER — Telehealth: Payer: Self-pay | Admitting: *Deleted

## 2016-09-07 ENCOUNTER — Other Ambulatory Visit (INDEPENDENT_AMBULATORY_CARE_PROVIDER_SITE_OTHER): Payer: Medicare Other

## 2016-09-07 DIAGNOSIS — K31819 Angiodysplasia of stomach and duodenum without bleeding: Secondary | ICD-10-CM

## 2016-09-07 DIAGNOSIS — K31811 Angiodysplasia of stomach and duodenum with bleeding: Secondary | ICD-10-CM

## 2016-09-07 DIAGNOSIS — I780 Hereditary hemorrhagic telangiectasia: Secondary | ICD-10-CM | POA: Diagnosis not present

## 2016-09-07 LAB — CBC WITH DIFFERENTIAL/PLATELET
BASOS PCT: 0 %
Basophils Absolute: 0 10*3/uL (ref 0.0–0.1)
EOS ABS: 0.3 10*3/uL (ref 0.0–0.7)
EOS PCT: 7 %
HCT: 31.2 % — ABNORMAL LOW (ref 36.0–46.0)
Hemoglobin: 9.7 g/dL — ABNORMAL LOW (ref 12.0–15.0)
LYMPHS ABS: 0.5 10*3/uL — AB (ref 0.7–4.0)
Lymphocytes Relative: 11 %
MCH: 29.2 pg (ref 26.0–34.0)
MCHC: 31.1 g/dL (ref 30.0–36.0)
MCV: 94 fL (ref 78.0–100.0)
Monocytes Absolute: 0.5 10*3/uL (ref 0.1–1.0)
Monocytes Relative: 10 %
Neutro Abs: 3.5 10*3/uL (ref 1.7–7.7)
Neutrophils Relative %: 72 %
PLATELETS: 180 10*3/uL (ref 150–400)
RBC: 3.32 MIL/uL — AB (ref 3.87–5.11)
RDW: 15.2 % (ref 11.5–15.5)
WBC: 4.9 10*3/uL (ref 4.0–10.5)

## 2016-09-07 LAB — SAMPLE TO BLOOD BANK

## 2016-09-07 NOTE — Telephone Encounter (Signed)
Called pt - talked to her husband, informed pt's "blood count good at 9.7 " per Dr [name]. Stated "good" and he will inform [name].

## 2016-09-07 NOTE — Telephone Encounter (Signed)
-----   Message from [name], MD sent at 09/07/2016  1:36 PM EDT ----- Call pt: blood count good at 9.7

## 2016-09-10 ENCOUNTER — Telehealth: Payer: Self-pay | Admitting: *Deleted

## 2016-09-10 NOTE — Telephone Encounter (Signed)
Returned pt's call - she had asked about receiving blood transfusion. I had called/talked to her husband on 7/30 about lab result/no transfusion. Called pt's husband - he asked about lab appt. No appts had been scheduled since last visit. Scheduled lab appt for Monday the 6th - since she's still on weekly labs until changed per Dr [name].

## 2016-09-14 ENCOUNTER — Telehealth: Payer: Self-pay | Admitting: *Deleted

## 2016-09-14 ENCOUNTER — Other Ambulatory Visit: Payer: Self-pay | Admitting: Oncology

## 2016-09-14 ENCOUNTER — Other Ambulatory Visit (INDEPENDENT_AMBULATORY_CARE_PROVIDER_SITE_OTHER): Payer: Medicare Other

## 2016-09-14 DIAGNOSIS — K31811 Angiodysplasia of stomach and duodenum with bleeding: Secondary | ICD-10-CM

## 2016-09-14 DIAGNOSIS — I780 Hereditary hemorrhagic telangiectasia: Secondary | ICD-10-CM | POA: Diagnosis not present

## 2016-09-14 DIAGNOSIS — K31819 Angiodysplasia of stomach and duodenum without bleeding: Secondary | ICD-10-CM

## 2016-09-14 LAB — CBC WITH DIFFERENTIAL/PLATELET
BASOS PCT: 0 %
Basophils Absolute: 0 10*3/uL (ref 0.0–0.1)
EOS ABS: 0.3 10*3/uL (ref 0.0–0.7)
Eosinophils Relative: 7 %
HEMATOCRIT: 29.2 % — AB (ref 36.0–46.0)
Hemoglobin: 9.5 g/dL — ABNORMAL LOW (ref 12.0–15.0)
LYMPHS ABS: 0.7 10*3/uL (ref 0.7–4.0)
Lymphocytes Relative: 15 %
MCH: 30.2 pg (ref 26.0–34.0)
MCHC: 32.5 g/dL (ref 30.0–36.0)
MCV: 92.7 fL (ref 78.0–100.0)
MONO ABS: 0.3 10*3/uL (ref 0.1–1.0)
MONOS PCT: 7 %
Neutro Abs: 3.2 10*3/uL (ref 1.7–7.7)
Neutrophils Relative %: 71 %
Platelets: 205 10*3/uL (ref 150–400)
RBC: 3.15 MIL/uL — ABNORMAL LOW (ref 3.87–5.11)
RDW: 14.5 % (ref 11.5–15.5)
WBC: 4.5 10*3/uL (ref 4.0–10.5)

## 2016-09-14 LAB — SAMPLE TO BLOOD BANK

## 2016-09-14 LAB — FERRITIN: Ferritin: 83 ng/mL (ref 11–307)

## 2016-09-14 NOTE — Telephone Encounter (Signed)
-----   Message from [name], MD sent at 09/14/2016 12:55 PM EDT ----- Call pt: blood count good. No transfusion this week

## 2016-09-14 NOTE — Telephone Encounter (Signed)
Pt called / informed "blood count good. No transfusion this week " per Dr [name]. Stated "thank-you for calling".

## 2016-09-17 ENCOUNTER — Encounter: Payer: Self-pay | Admitting: Cardiology

## 2016-09-17 ENCOUNTER — Ambulatory Visit (INDEPENDENT_AMBULATORY_CARE_PROVIDER_SITE_OTHER): Payer: Medicare Other | Admitting: Cardiology

## 2016-09-17 VITALS — BP 118/51 | HR 63 | Ht 63.0 in | Wt 151.4 lb

## 2016-09-17 DIAGNOSIS — I5032 Chronic diastolic (congestive) heart failure: Secondary | ICD-10-CM

## 2016-09-17 DIAGNOSIS — I480 Paroxysmal atrial fibrillation: Secondary | ICD-10-CM | POA: Diagnosis not present

## 2016-09-17 DIAGNOSIS — R011 Cardiac murmur, unspecified: Secondary | ICD-10-CM

## 2016-09-17 DIAGNOSIS — R600 Localized edema: Secondary | ICD-10-CM

## 2016-09-17 MED ORDER — POTASSIUM CHLORIDE CRYS ER 20 MEQ PO TBCR: EXTENDED_RELEASE_TABLET | ORAL | 5 refills | Status: DC

## 2016-09-17 MED ORDER — FUROSEMIDE 20 MG PO TABS: 20.0000 mg | ORAL_TABLET | Freq: Every day | ORAL | 5 refills | Status: DC

## 2016-09-17 NOTE — Progress Notes (Signed)
09/17/2016 [name]   1940-10-29  [id number]  Primary Physician Gwynn Burly, DO Primary Cardiologist: Dr. Mayford Knife    Reason for Visit/CC: f/u for Chronic Systolic HF and Medication Refills   HPI:  76 y.o. female who presents for followup of grade II diastolic dysfunction with chronic diastolic CHF. She has a history of Osler-weber-rendu disase, gastric AVMs and chronic iron deficiency anemia requiring frequent transfusions. She also has a history of HTN, dyslipidemia, type II DM and GERD. 2D echo 06/2014 showed mild LVOT gradient.  Moderate to severe asymmetric septal hypertrophy with LVEF 65-70%. Grade 2 diastolic dysfunction. Severe left atrial enlargement. Mild mitral regurgitation. Mildly sclerotic aortic valve without stenosis - mildly increased gradient more likely associated with increased flow across LVOT (no significant SAM however). Mild tricuspid regurgitation with RV-RA gradient 39 mmHg.  She has not been seen since 2016. She has been followed by Dr. Mayford Knife. She presents to clinic today for medication management and is in need of refills.   She reports that she has done well from a cardiac standpoint. No symptoms. She denies chest pain and dyspnea. No lower extremity edema, orthopnea or PND. No palpitations, dizziness, syncope/near-syncope. Her only complaint is that her diabetes has been poorly controlled and her PCP has recently been adjusting her insulin.   Current Meds  Medication Sig  . AMITIZA 24 MCG capsule Take 24 mcg by mouth 2 (two) times daily.  . baclofen (LIORESAL) 10 MG tablet Take 0.5 tablets (5 mg total) by mouth 2 (two) times daily as needed for muscle spasms.  . Blood Glucose Monitoring Suppl (ONETOUCH VERIO) w/Device KIT   . calcium citrate-vitamin D (CITRACAL+D) 315-200 MG-UNIT tablet Take 1 tablet by mouth 2 (two) times daily.  . cetaphil (CETAPHIL) lotion Apply 1 application topically as needed for dry skin.  Marland Kitchen diclofenac sodium (VOLTAREN) 1 % GEL  Apply 2 g topically 4 (four) times daily.  Marland Kitchen donepezil (ARICEPT) 10 MG tablet Take 1 tablet (10 mg total) by mouth at bedtime.  Clinical research associate Bandages & Supports (MEDICAL COMPRESSION STOCKINGS) MISC Wear as much as possible while awake to reduce swelling  . FERROCITE 324 MG TABS tablet TAKE ONE TABLET BY MOUTH TWICE DAILY  . furosemide (LASIX) 20 MG tablet Take 1 tablet (20 mg total) by mouth daily.  Marland Kitchen glucose blood (ONETOUCH VERIO) test strip Use as instructed 3-4 times daily. E11.29, insulin requiring  . Insulin Glargine (LANTUS) 100 UNIT/ML Solostar Pen Inject 16 Units into the skin 2 (two) times daily.  . insulin lispro (HUMALOG) 100 UNIT/ML KiwkPen Inject 0.07 mLs (7 Units total) into the skin 3 (three) times daily.  . Insulin Pen Needle 31G X 5 MM MISC Use to inject insulin  . lactulose (CHRONULAC) 10 GM/15ML solution Take 15 mLs (10 g total) by mouth 2 (two) times daily.  Marland Kitchen levETIRAcetam (KEPPRA) 500 MG tablet Take 1 tablet (500 mg total) by mouth 2 (two) times daily.  . memantine (NAMENDA) 10 MG tablet Take 1 tablet (10 mg total) by mouth 2 (two) times daily.  Letta Pate DELICA LANCETS FINE MISC Check blood sugar up to 3 times a day  . oxymetazoline (AFRIN) 0.05 % nasal spray Place 1 spray into both nostrils 2 (two) times daily as needed (Epistaxis).  . pantoprazole (PROTONIX) 40 MG tablet Take 1 tablet (40 mg total) by mouth 2 (two) times daily.  . potassium chloride SA (KLOR-CON M20) 20 MEQ tablet TAKE 1 TABLET BY MOUTH ONCE DAILY  . pramipexole (  MIRAPEX) 0.125 MG tablet Take 1 tablet (0.125 mg total) by mouth daily.  . sodium chloride (OCEAN) 0.65 % SOLN nasal spray Place 1 spray into both nostrils as needed for congestion.   Current Facility-Administered Medications for the 09/17/16 encounter (Office Visit) with Consuelo Pandy, PA-C  Medication  . 0.9 %  sodium chloride infusion   Allergies  Allergen Reactions  . Aspirin Nausea And Vomiting   Past Medical History:  Diagnosis  Date  . CHF (congestive heart failure) ([place])   . Chronic anemia   . Chronic diastolic CHF (congestive heart failure) ([place]) 10/03/2013  . Chronic GI bleeding    [name] 11/29/2014  . Family history of anesthesia complication    "niece has a hard time coming out" (09/15/2012)  . Frequent nosebleeds    chronic  . Gastric AV malformation    [name] 11/29/2014  . GERD (gastroesophageal reflux disease)   . Heart murmur   . History of blood transfusion "several"  . History of epistaxis   . HTN (hypertension), benign 03/02/2012  . Hyperlipidemia   . Iron deficiency anemia    chronic infusions"  . Lichen planus    Both lower extremities  . 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 Pneumonia 1990's X 2  . Seizures ([place]) 09/2014  . Telangiectasia    Gastric   . 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   Family History  Problem Relation Age of Onset  . Stroke Father   . Breast cancer Unknown   . Malignant hyperthermia Neg Hx   . Seizures Neg Hx    Past Surgical History:  Procedure Laterality Date  . CATARACT EXTRACTION     "I think it was just one eye"  . ESOPHAGOGASTRODUODENOSCOPY  02/26/2011   Procedure: ESOPHAGOGASTRODUODENOSCOPY (EGD);  Surgeon: [name], MD;  Location: [name];  Service: Endoscopy;  Laterality: N/A;  . ESOPHAGOGASTRODUODENOSCOPY N/A 11/08/2012   Procedure: ESOPHAGOGASTRODUODENOSCOPY (EGD);  Surgeon: [name], MD;  Location: [name];  Service: Endoscopy;  Laterality: N/A;  . ESOPHAGOGASTRODUODENOSCOPY N/A 10/04/2013   Procedure: ESOPHAGOGASTRODUODENOSCOPY (EGD);  Surgeon: Winfield Cunas., MD;  Location: [name];  Service: Endoscopy;  Laterality: N/A;  with APC on stand-by  . ESOPHAGOGASTRODUODENOSCOPY N/A 07/06/2014   Procedure: ESOPHAGOGASTRODUODENOSCOPY (EGD);  Surgeon: Clarene Essex, MD;  Location: [name];  Service: Endoscopy;  Laterality: N/A;  . ESOPHAGOGASTRODUODENOSCOPY N/A 09/05/2014   Procedure:  ESOPHAGOGASTRODUODENOSCOPY (EGD);  Surgeon: Laurence Spates, MD;  Location: [name];  Service: Endoscopy;  Laterality: N/A;  APC on standby to control bleeding  . ESOPHAGOGASTRODUODENOSCOPY N/A 11/29/2014   Procedure: ESOPHAGOGASTRODUODENOSCOPY (EGD);  Surgeon: [name], MD;  Location[facility] ENDOSCOPY;  Service: Endoscopy;  Laterality: N/A;  . ESOPHAGOGASTRODUODENOSCOPY N/A 09/28/2015   Procedure: ESOPHAGOGASTRODUODENOSCOPY (EGD);  Surgeon: Clarene Essex, MD;  Location: [facility] ENDOSCOPY;  Service: Endoscopy;  Laterality: N/A;  . ESOPHAGOGASTRODUODENOSCOPY ENDOSCOPY  08/19/2006   with laser treatment  . HOT HEMOSTASIS N/A 07/06/2014   Procedure: HOT HEMOSTASIS (ARGON PLASMA COAGULATION/BICAP);  Surgeon: Clarene Essex, MD;  Location: [name];  Service: Endoscopy;  Laterality: N/A;  . HOT HEMOSTASIS N/A 09/28/2015   Procedure: HOT HEMOSTASIS (ARGON PLASMA COAGULATION/BICAP);  Surgeon: Clarene Essex, MD;  Location: [facility] ENDOSCOPY;  Service: Endoscopy;  Laterality: N/A;  . NASAL HEMORRHAGE CONTROL     "for bleeding"   . SAVORY DILATION  02/26/2011   Procedure: SAVORY DILATION;  Surgeon: [name], MD;  Location: [name];  Service: Endoscopy;  Laterality: N/A;  c-arm needed   Social History   Social History  . Marital status: Married    Spouse name: [name]  . Number of children: 0  . Years of education: 9   Occupational History  . Retired Engineer, manufacturing systems Retired   Social History Main Topics  . Smoking status: Former Smoker    Packs/day: 1.00    Years: 20.00    Types: Cigarettes    Quit date: 02/10/1971  . Smokeless tobacco: Never Used     Comment: 09/15/2012 "smoked 50-60 yr ago"  . Alcohol use No  . Drug use: No  . Sexual activity: Not on file   Other Topics Concern  . Not on file   Social History Narrative   Married, lives with husband, [name].  Ambulates without assistance.     Caffeine use: none     Review of Systems: General: negative for chills, fever, night sweats or weight  changes.  Cardiovascular: negative for chest pain, dyspnea on exertion, edema, orthopnea, palpitations, paroxysmal nocturnal dyspnea or shortness of breath Dermatological: negative for rash Respiratory: negative for cough or wheezing Urologic: negative for hematuria Abdominal: negative for nausea, vomiting, diarrhea, bright red blood per rectum, melena, or hematemesis Neurologic: negative for visual changes, syncope, or dizziness All other systems reviewed and are otherwise negative except as noted above.   Physical Exam:  Blood pressure (!) 118/51, pulse 63, height '5\' 3"'$  (1.6 m), weight 151 lb 6.4 oz (68.7 kg).  General appearance: alert, cooperative and no distress Neck: no carotid bruit and no JVD Lungs: clear to auscultation bilaterally Heart: regular rate and rhythm, S1, S2 normal, no murmur, click, rub or gallop Extremities: extremities normal, atraumatic, no cyanosis or edema Pulses: 2+ and symmetric Skin: Skin color, texture, turgor normal. No rashes or lesions Neurologic: Grossly normal  EKG NSR, LVH, inferior infarct cannot be ruled out -- personally reviewed   ASSESSMENT AND PLAN:   1. Chronic diastolic heart failure: She denies dyspnea. She is euvolemic on physical exam. Blood pressure is well-controlled. Continue Lasix. Check BMP today  2. Hypertension: Controlled with Lasix. Check BMP for monitoring of renal function and electrolytes  3. Systolic murmur: mild LVOT gradient and ASSM by echo 06/2014. Repeat Echo.   4. Abnormal EKG: inferior infarct cannot be ruled out. Some Q waves noted on EKG. Pt denies recent CP, however she has poorly controlled DM and is at risk for silent ischemia. However given her history of severe anemia, requiring routine transfusions, she may not be a candidate for LHC and DAPT. We are obtaining a f/u 2D echo. If any significant reduction in EF or new WMA, then we may need to reconsider ischemic w/u   Follow-Up: if significant 2D echo  abnormalities, we will have pt return for f/u w/ APP. Otherwise f/u with Dr. Radford Pax in 6 months.   Geet Hosking Ladoris Gene, MHS [facility] 09/17/2016 3:36 PM

## 2016-09-17 NOTE — Patient Instructions (Signed)
Medication Instructions:  Your physician recommends that you continue on your current medications as directed. Please refer to the Current Medication list given to you today.  Your medications have been refilled  Labwork: Bmet today  Testing/Procedures: Your physician has requested that you have an echocardiogram. Echocardiography is a painless test that uses sound waves to create images of your heart. It provides your doctor with information about the size and shape of your heart and how well your heart's chambers and valves are working. This procedure takes approximately one hour. There are no restrictions for this procedure.   Follow-Up: Your physician recommends that you schedule a follow-up appointment in: 4 months with Dr.Turner   Any Other Special Instructions Will Be Listed Below (If Applicable).     If you need a refill on your cardiac medications before your next appointment, please call your pharmacy.

## 2016-09-18 ENCOUNTER — Other Ambulatory Visit: Payer: Self-pay | Admitting: Dietician

## 2016-09-18 ENCOUNTER — Telehealth: Payer: Self-pay | Admitting: *Deleted

## 2016-09-18 ENCOUNTER — Other Ambulatory Visit: Payer: Self-pay | Admitting: Internal Medicine

## 2016-09-18 DIAGNOSIS — E1121 Type 2 diabetes mellitus with diabetic nephropathy: Secondary | ICD-10-CM

## 2016-09-18 DIAGNOSIS — E1165 Type 2 diabetes mellitus with hyperglycemia: Principal | ICD-10-CM

## 2016-09-18 LAB — BASIC METABOLIC PANEL
BUN/Creatinine Ratio: 23 (ref 12–28)
BUN: 29 mg/dL — AB (ref 8–27)
CALCIUM: 8.6 mg/dL — AB (ref 8.7–10.3)
CO2: 24 mmol/L (ref 20–29)
CREATININE: 1.25 mg/dL — AB (ref 0.57–1.00)
Chloride: 97 mmol/L (ref 96–106)
GFR calc Af Amer: 48 mL/min/{1.73_m2} — ABNORMAL LOW (ref >59)
GFR, EST NON AFRICAN AMERICAN: 42 mL/min/{1.73_m2} — AB (ref >59)
Glucose: 791 mg/dL (ref 65–99)
POTASSIUM: 4.9 mmol/L (ref 3.5–5.2)
Sodium: 136 mmol/L (ref 134–144)

## 2016-09-18 NOTE — Progress Notes (Signed)
CGM referral request

## 2016-09-18 NOTE — Telephone Encounter (Signed)
Call from Santiago Glad at [facility] with critical glucose from yesterday--791.  I called patient.  She said she feels fine.  Her glucose on her home meter is 214 this AM.  Last night at 9:07 it was 500.  I advised her of the 791 reading yesterday.  She said Dr. Juleen China is who manages her diabetes.  She states she is not having any problems using her insulin.   I asked her to call Dr. Alcario Drought office today and schedule an appointment to follow up on her diabetes.

## 2016-09-20 ENCOUNTER — Other Ambulatory Visit: Payer: Self-pay | Admitting: Internal Medicine

## 2016-09-21 ENCOUNTER — Other Ambulatory Visit: Payer: Medicare Other

## 2016-09-21 ENCOUNTER — Telehealth: Payer: Self-pay | Admitting: Cardiology

## 2016-09-21 ENCOUNTER — Encounter: Payer: Self-pay | Admitting: Internal Medicine

## 2016-09-21 ENCOUNTER — Other Ambulatory Visit: Payer: Self-pay | Admitting: Internal Medicine

## 2016-09-21 ENCOUNTER — Ambulatory Visit (INDEPENDENT_AMBULATORY_CARE_PROVIDER_SITE_OTHER): Payer: Medicare Other | Admitting: Internal Medicine

## 2016-09-21 ENCOUNTER — Ambulatory Visit (INDEPENDENT_AMBULATORY_CARE_PROVIDER_SITE_OTHER): Payer: Medicare Other | Admitting: Dietician

## 2016-09-21 VITALS — BP 114/38 | HR 60 | Temp 98.0°F | Ht 63.0 in | Wt 154.4 lb

## 2016-09-21 DIAGNOSIS — Z794 Long term (current) use of insulin: Secondary | ICD-10-CM

## 2016-09-21 DIAGNOSIS — K31819 Angiodysplasia of stomach and duodenum without bleeding: Secondary | ICD-10-CM

## 2016-09-21 DIAGNOSIS — I780 Hereditary hemorrhagic telangiectasia: Secondary | ICD-10-CM | POA: Diagnosis not present

## 2016-09-21 DIAGNOSIS — E1165 Type 2 diabetes mellitus with hyperglycemia: Secondary | ICD-10-CM | POA: Diagnosis not present

## 2016-09-21 DIAGNOSIS — E118 Type 2 diabetes mellitus with unspecified complications: Secondary | ICD-10-CM

## 2016-09-21 DIAGNOSIS — K31811 Angiodysplasia of stomach and duodenum with bleeding: Secondary | ICD-10-CM

## 2016-09-21 DIAGNOSIS — Z713 Dietary counseling and surveillance: Secondary | ICD-10-CM | POA: Diagnosis not present

## 2016-09-21 DIAGNOSIS — E1121 Type 2 diabetes mellitus with diabetic nephropathy: Secondary | ICD-10-CM

## 2016-09-21 LAB — CBC WITH DIFFERENTIAL/PLATELET
BASOS ABS: 0 10*3/uL (ref 0.0–0.1)
Basophils Relative: 0 %
Eosinophils Absolute: 0.3 10*3/uL (ref 0.0–0.7)
Eosinophils Relative: 6 %
HEMATOCRIT: 25.2 % — AB (ref 36.0–46.0)
Hemoglobin: 7.8 g/dL — ABNORMAL LOW (ref 12.0–15.0)
LYMPHS ABS: 0.8 10*3/uL (ref 0.7–4.0)
Lymphocytes Relative: 14 %
MCH: 29 pg (ref 26.0–34.0)
MCHC: 31 g/dL (ref 30.0–36.0)
MCV: 93.7 fL (ref 78.0–100.0)
MONO ABS: 0.5 10*3/uL (ref 0.1–1.0)
MONOS PCT: 9 %
Neutro Abs: 4.2 10*3/uL (ref 1.7–7.7)
Neutrophils Relative %: 71 %
Platelets: 217 10*3/uL (ref 150–400)
RBC: 2.69 MIL/uL — ABNORMAL LOW (ref 3.87–5.11)
RDW: 14.6 % (ref 11.5–15.5)
WBC: 5.8 10*3/uL (ref 4.0–10.5)

## 2016-09-21 LAB — SAMPLE TO BLOOD BANK

## 2016-09-21 LAB — POCT GLYCOSYLATED HEMOGLOBIN (HGB A1C): Hemoglobin A1C: 7.6

## 2016-09-21 LAB — GLUCOSE, CAPILLARY: GLUCOSE-CAPILLARY: 164 mg/dL — AB (ref 65–99)

## 2016-09-21 NOTE — Patient Instructions (Signed)
Mrs. & Mr. Ferner,   Please be sure there is insulin coming from the pen by doing an AIRSHOT of 2 UNITS before you given yourself an injection.  Also please give the Humalog before meals- take it before your dinner and not at bedtime.  You can take the lantus at dinner also if desired.

## 2016-09-21 NOTE — Progress Notes (Signed)
   CC: DM follow up   HPI:  [name] is a 76 y.o. female with PMH as described below who presents to clinic for DM follow up. Please see problem-based assessment and plan for further details.   Past Medical History:  Diagnosis Date  . CHF (congestive heart failure) ([place])   . Chronic anemia   . Chronic diastolic CHF (congestive heart failure) ([place]) 10/03/2013  . Chronic GI bleeding    [name] 11/29/2014  . Family history of anesthesia complication    "niece has a hard time coming out" (09/15/2012)  . Frequent nosebleeds    chronic  . Gastric AV malformation    [name] 11/29/2014  . GERD (gastroesophageal reflux disease)   . Heart murmur   . History of blood transfusion "several"  . History of epistaxis   . HTN (hypertension), benign 03/02/2012  . Hyperlipidemia   . Iron deficiency anemia    chronic infusions"  . Lichen planus    Both lower extremities  . 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 Pneumonia 1990's X 2  . Seizures ([place]) 09/2014  . Telangiectasia    Gastric   . 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   Review of Systems:   Review of Systems  Constitutional: Negative for chills and fever.  Eyes: Negative for blurred vision and double vision.  Cardiovascular: Negative for chest pain and palpitations.  Gastrointestinal: Negative for abdominal pain, nausea and vomiting.  Genitourinary: Negative for dysuria, frequency and urgency.  Neurological: Negative for sensory change.    Physical Exam:  Vitals:   09/21/16 0955  BP: (!) 114/38  Pulse: 60  Temp: 98 F (36.7 C)  TempSrc: Oral  SpO2: 99%  Weight: 154 lb 6.4 oz (70 kg)  Height: 5\' 3"  (1.6 m)   General: pleasant female, well-developed, in no acute distress  HENT: NCAT,neck supple and FROM, MMM, OP clear without exudates or erythema  Cardiac: regular rate and rhythm, nl S1/S2, no murmurs, rubs or gallops  Pulm: CTAB, no wheezes or crackles, no increased work of  breathing  Abd: soft, NTND, bowel sounds present  Ext: warm and well perfused, no peripheral edema    Assessment & Plan:   See Encounters Tab for problem based charting.  Patient discussed with Dr. Lynnae January

## 2016-09-21 NOTE — Progress Notes (Signed)
  Medical Nutrition Therapy:  Appt start time: 8921 end time:  1030. Visit # 8  Assessment:  Primary concerns today: glycemic control Met briefly with [name] today to assist with blood sugar control.  She reports that she is eating well, three times a day and takes insulin as ordered except she is taking Humalog at bedtime with hr second dose of Lantus. Her  husband assists with insulin, today he asks what to do if the insulin does not come out of the insulin pen. He thinks maybe she was not getting insulin on some occassions due to this. [name] says they get meals on wheels and would like more help at home for cooking and cleaning. Home health comes out one time a week for a few minutes per her husband and chekcs their blood sugar and blood pressure.  ANTHROPOMETRICS:  Weight increased to 154.4#, BMI now 27.35. MEDICATIONS: takes lantus 16 units two times a day, Novolog 7 units with breakfast. Lunch and bedtime. TDD: 53 units/day. 32 basal(60%) and 21 bolus(~40%) BLOOD SUGAR: meter download shows that she is checking her blood sugar  X/day, The last few days her blood sugars are well controlled in the 100s. Looking back at hr log, her blood sugars appear to alternate between being well controlled and higher than 300.  Progress Towards Goal(s):  In progress.   Nutritional Diagnosis:  [place]-2.2 Altered nutrition-related laboratory As related to high pranidal blood sugar.  As evidenced by her meter data    Intervention:  Nutrition education and counseling about blood sugars and taking insulin with meals and on time. Coordination of care: assisted with adding a correction scale to her food dose, call to [place] Method Utilized: Visual, Auditory, Hands on Handouts given during visit include:AVS Barriers to learning/adherence to lifestyle change: support, memory issues Demonstrated degree of understanding via:  Teach Back   Monitoring/Evaluation:  Dietary intake, exercise, meter, and  body weight in 3 week  in office.  [name], [name], RD 09/21/2016 10:45 AM.

## 2016-09-21 NOTE — Assessment & Plan Note (Signed)
Patient presenting for DM follow up. She was seen by her cardiologist on 8/9, at which time she was noted to have a BG 791 on BMET. She was instructed to make appt with PCP for DM management. Reports no polydipsia, but states she drinks a great amount of water. Unclear if she is having polyuria given she is on Lasix. She is currently on Lantus 16U BID and Humalog 7U with meals. Reports compliance with medication, but seems confused when describing when and how much insulin she is injecting each time. Her husband usually sets the in the insulin pen for her and she then injects it. Per dietitian, husband reported insulin pens might not be working properly, though I did not confirm this as he was not present in the room. BG from meter nl in the past 3 days (120-180) but markedly elevated prior to this (300s-500s). Last A1c 6.6 05/2016, however this may be falsely lowered in the setting of chronic blood transfusions that patient receives for HHT. Patient is a good candidate for CGM, but declined it as she did not liked to have monitor on her the last time.    - POC A1c 7.6 today. May be falsely lowered in the setting of chronic blood transfusions for HHT.  - No changes on long or short-acting insulin at this time given that she has had nl BGs the past 3 days, and would like to avoid hypoglycemic episodes.  - Dietitian provided information about insulin pens and instructed husband to test it. She will follow up on this.  - Patient instructed to do insulin correction (calculated with the help of dietitian) and provided detailed instructions on how to do this. I reviewed these instructions with patient and she voiced understanding. Please see AVS for more details.  - RTC 1-2 weeks for DM follow-up. Patient instructed to bring glucose meter and log with her.

## 2016-09-21 NOTE — Patient Instructions (Signed)
It was a pleasure to meet you today,[name]. We made changes in your insulin today.   If your blood sugar is less than 200, inject 7 units of Humalog as you usually do before eating.   If blood sugar between 201-250, inject 8 units of Humalog before eating.    If blood sugar between 251-300, inject 9 units of Humalog before eating.    If blood sugar between 301-350, inject 10 units of Humalog before eating.    If blood sugar between 351-400, inject 11 units of Humalog before eating.    If blood sugar between 401-450, inject 12 units of Humalog before eating.    If your blood sugar is more than 451, inject 13 units of Humalog and call the [facility] clinic.   Please follow up in 1-2 weeks.

## 2016-09-21 NOTE — Assessment & Plan Note (Signed)
Patient sees Dr. [name]. Hgb 7.8 today. Denies symptoms of anemia.   - Will be scheduled for blood transfusion (1 unit) this week

## 2016-09-23 ENCOUNTER — Other Ambulatory Visit: Payer: Self-pay

## 2016-09-23 ENCOUNTER — Ambulatory Visit (HOSPITAL_COMMUNITY): Payer: Medicare Other | Attending: Cardiovascular Disease

## 2016-09-23 ENCOUNTER — Other Ambulatory Visit: Payer: Self-pay | Admitting: Oncology

## 2016-09-23 DIAGNOSIS — I061 Rheumatic aortic insufficiency: Secondary | ICD-10-CM | POA: Diagnosis not present

## 2016-09-23 DIAGNOSIS — I5030 Unspecified diastolic (congestive) heart failure: Secondary | ICD-10-CM | POA: Diagnosis not present

## 2016-09-23 DIAGNOSIS — R011 Cardiac murmur, unspecified: Secondary | ICD-10-CM

## 2016-09-23 DIAGNOSIS — I5032 Chronic diastolic (congestive) heart failure: Secondary | ICD-10-CM | POA: Diagnosis not present

## 2016-09-23 DIAGNOSIS — I420 Dilated cardiomyopathy: Secondary | ICD-10-CM | POA: Insufficient documentation

## 2016-09-23 DIAGNOSIS — I780 Hereditary hemorrhagic telangiectasia: Secondary | ICD-10-CM

## 2016-09-23 DIAGNOSIS — K5521 Angiodysplasia of colon with hemorrhage: Secondary | ICD-10-CM

## 2016-09-23 NOTE — Progress Notes (Signed)
Internal Medicine Clinic Attending  I saw and evaluated the patient.  I personally confirmed the key portions of the history and exam documented by Dr. Santos-Sancheza and I reviewed pertinent patient test results.  The assessment, diagnosis, and plan were formulated together and I agree with the documentation in the resident's note. 

## 2016-09-24 ENCOUNTER — Other Ambulatory Visit (INDEPENDENT_AMBULATORY_CARE_PROVIDER_SITE_OTHER): Payer: Medicare Other

## 2016-09-24 DIAGNOSIS — Q2733 Arteriovenous malformation of digestive system vessel: Secondary | ICD-10-CM

## 2016-09-24 DIAGNOSIS — K31819 Angiodysplasia of stomach and duodenum without bleeding: Secondary | ICD-10-CM

## 2016-09-24 DIAGNOSIS — I780 Hereditary hemorrhagic telangiectasia: Secondary | ICD-10-CM

## 2016-09-24 LAB — PREPARE RBC (CROSSMATCH)

## 2016-09-24 NOTE — Addendum Note (Signed)
Addended by: Ebbie Latus on: 09/24/2016 11:39 AM   Modules accepted: Orders

## 2016-09-25 ENCOUNTER — Other Ambulatory Visit (HOSPITAL_COMMUNITY): Payer: Medicare Other

## 2016-09-25 ENCOUNTER — Ambulatory Visit (HOSPITAL_COMMUNITY)
Admission: RE | Admit: 2016-09-25 | Discharge: 2016-09-25 | Disposition: A | Discharge status: Home / Self Care | Payer: Medicare Other | Source: Ambulatory Visit | Attending: Oncology | Admitting: Oncology

## 2016-09-25 DIAGNOSIS — K558 Other vascular disorders of intestine: Secondary | ICD-10-CM | POA: Diagnosis present

## 2016-09-25 DIAGNOSIS — K5521 Angiodysplasia of colon with hemorrhage: Secondary | ICD-10-CM

## 2016-09-25 DIAGNOSIS — I780 Hereditary hemorrhagic telangiectasia: Secondary | ICD-10-CM | POA: Diagnosis present

## 2016-09-25 MED ORDER — HEPARIN SOD (PORK) LOCK FLUSH 100 UNIT/ML IV SOLN
INTRAVENOUS | Status: AC
  Filled 2016-09-25: qty 5

## 2016-09-25 MED ORDER — HEPARIN SOD (PORK) LOCK FLUSH 100 UNIT/ML IV SOLN
500.0000 [IU] | Freq: Once | INTRAVENOUS | Status: AC
  Administered 2016-09-25: 500 [IU] via INTRAVENOUS

## 2016-09-25 MED ORDER — SODIUM CHLORIDE 0.9 % IV SOLN
Freq: Once | INTRAVENOUS | Status: AC
  Administered 2016-09-25: 08:00:00 via INTRAVENOUS

## 2016-09-26 LAB — BPAM RBC
BLOOD PRODUCT EXPIRATION DATE: 201809052359
ISSUE DATE / TIME: 201808170814
Unit Type and Rh: 6200

## 2016-09-26 LAB — TYPE AND SCREEN
ABO/RH(D): A POS
ANTIBODY SCREEN: NEGATIVE
Unit division: 0

## 2016-09-28 ENCOUNTER — Other Ambulatory Visit: Payer: Medicare Other

## 2016-09-28 ENCOUNTER — Telehealth: Payer: Self-pay | Admitting: Dietician

## 2016-09-28 ENCOUNTER — Telehealth: Payer: Self-pay | Admitting: *Deleted

## 2016-09-28 ENCOUNTER — Ambulatory Visit (INDEPENDENT_AMBULATORY_CARE_PROVIDER_SITE_OTHER): Payer: Medicare Other | Admitting: Internal Medicine

## 2016-09-28 ENCOUNTER — Other Ambulatory Visit: Payer: Self-pay | Admitting: Oncology

## 2016-09-28 ENCOUNTER — Other Ambulatory Visit (HOSPITAL_COMMUNITY): Payer: Self-pay | Admitting: *Deleted

## 2016-09-28 VITALS — BP 130/44 | HR 66 | Temp 98.0°F | Ht 63.0 in | Wt 155.6 lb

## 2016-09-28 DIAGNOSIS — R0602 Shortness of breath: Secondary | ICD-10-CM | POA: Diagnosis not present

## 2016-09-28 DIAGNOSIS — Z794 Long term (current) use of insulin: Secondary | ICD-10-CM

## 2016-09-28 DIAGNOSIS — Z9119 Patient's noncompliance with other medical treatment and regimen: Secondary | ICD-10-CM

## 2016-09-28 DIAGNOSIS — E1121 Type 2 diabetes mellitus with diabetic nephropathy: Secondary | ICD-10-CM

## 2016-09-28 DIAGNOSIS — I780 Hereditary hemorrhagic telangiectasia: Secondary | ICD-10-CM

## 2016-09-28 DIAGNOSIS — R011 Cardiac murmur, unspecified: Secondary | ICD-10-CM

## 2016-09-28 DIAGNOSIS — E1165 Type 2 diabetes mellitus with hyperglycemia: Principal | ICD-10-CM

## 2016-09-28 DIAGNOSIS — K31811 Angiodysplasia of stomach and duodenum with bleeding: Secondary | ICD-10-CM

## 2016-09-28 DIAGNOSIS — R600 Localized edema: Secondary | ICD-10-CM

## 2016-09-28 DIAGNOSIS — D500 Iron deficiency anemia secondary to blood loss (chronic): Secondary | ICD-10-CM

## 2016-09-28 DIAGNOSIS — E118 Type 2 diabetes mellitus with unspecified complications: Secondary | ICD-10-CM | POA: Diagnosis not present

## 2016-09-28 LAB — CBC WITH DIFFERENTIAL/PLATELET
Basophils Absolute: 0 10*3/uL (ref 0.0–0.1)
Basophils Relative: 0 %
Eosinophils Absolute: 0.3 10*3/uL (ref 0.0–0.7)
Eosinophils Relative: 5 %
HEMATOCRIT: 24 % — AB (ref 36.0–46.0)
HEMOGLOBIN: 7.3 g/dL — AB (ref 12.0–15.0)
LYMPHS ABS: 0.7 10*3/uL (ref 0.7–4.0)
Lymphocytes Relative: 13 %
MCH: 28.4 pg (ref 26.0–34.0)
MCHC: 30.4 g/dL (ref 30.0–36.0)
MCV: 93.4 fL (ref 78.0–100.0)
MONO ABS: 0.3 10*3/uL (ref 0.1–1.0)
MONOS PCT: 6 %
Neutro Abs: 4.2 10*3/uL (ref 1.7–7.7)
Neutrophils Relative %: 76 %
Platelets: 146 10*3/uL — ABNORMAL LOW (ref 150–400)
RBC: 2.57 MIL/uL — ABNORMAL LOW (ref 3.87–5.11)
RDW: 18.3 % — ABNORMAL HIGH (ref 11.5–15.5)
WBC: 5.6 10*3/uL (ref 4.0–10.5)

## 2016-09-28 LAB — PREPARE RBC (CROSSMATCH)

## 2016-09-28 LAB — POCT GLYCOSYLATED HEMOGLOBIN (HGB A1C): Hemoglobin A1C: 5.6

## 2016-09-28 LAB — SAMPLE TO BLOOD BANK

## 2016-09-28 LAB — GLUCOSE, CAPILLARY: Glucose-Capillary: 331 mg/dL — ABNORMAL HIGH (ref 65–99)

## 2016-09-28 NOTE — Progress Notes (Signed)
   CC: Diabetes follow-up  HPI:  [name] is a 76 y.o. female with PMH listed below who presents to clinic for diabetes follow-up. Please see problem based assessment and plan for further details.  Past Medical History:  Diagnosis Date  . CHF (congestive heart failure) ([place])   . Chronic anemia   . Chronic diastolic CHF (congestive heart failure) ([place]) 10/03/2013  . Chronic GI bleeding    [name] 11/29/2014  . Family history of anesthesia complication    "niece has a hard time coming out" (09/15/2012)  . Frequent nosebleeds    chronic  . Gastric AV malformation    [name] 11/29/2014  . GERD (gastroesophageal reflux disease)   . Heart murmur   . History of blood transfusion "several"  . History of epistaxis   . HTN (hypertension), benign 03/02/2012  . Hyperlipidemia   . Iron deficiency anemia    chronic infusions"  . Lichen planus    Both lower extremities  . 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 Pneumonia 1990's X 2  . Seizures ([place]) 09/2014  . Telangiectasia    Gastric   . 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   Review of Systems:   Review of Systems  Constitutional: Negative for chills and fever.  Respiratory: Positive for shortness of breath. Negative for cough.   Cardiovascular: Positive for leg swelling. Negative for chest pain.  Gastrointestinal: Negative for abdominal pain, nausea and vomiting.    Physical Exam:  Vitals:   09/28/16 1027  BP: (!) 130/44  Pulse: 66  Temp: 98 F (36.7 C)  TempSrc: Oral  SpO2: 100%  Weight: 155 lb 9.6 oz (70.6 kg)  Height: 5\' 3"  (1.6 m)   General: pleasant, well-developed female, in no acute distress HENT: NCAT, neck supple and FROM, MMM, OP clear without exudates or erythema Cardiac: regular rate and rhythm, II/VI systolic murmur best heard over right sternal border  Pulm: CTAB, no wheezes or crackles, no increased work of breathing  Abd: soft, NTND, bowel sounds present  Ext:  warm and well perfused, mild peripheral edema bilaterally    Assessment & Plan:   See Encounters Tab for problem based charting.  Patient seen with Dr. Angelia Mould

## 2016-09-28 NOTE — Assessment & Plan Note (Addendum)
Patient returning for DM follow up. Seen 1 week ago and instructed to bring blood glucose meter and log to this visit. Instructed to do correction scale with meals during previous visit. Meter: avg BG 270 with no hypoglycemic episodes with AM BG 131-198 before breakfast and PM BG 92-297 before dinner. Improved from prior. Currently on Lantus 16 units twice a day and Humalog 7 units with meals with correction scale and reports compliance. However, on further questioning patient reports not taking Humalog at lunchtime. BG 496 after lunch. She continues to decline CGM.   - Start Humalog 7 units at lunchtime with correction scale - Will continue correction scale with meals - Continue Lantus 16 units twice a day

## 2016-09-28 NOTE — Patient Instructions (Addendum)
It was nice to see you again, [name].   Continue to inject Lantus 16 units in the morning and 16 units at night.  Continue to inject Humalog 7 units at breakfast and dinner. Start injecting Humalog 7 units at lunch.   Please follow the following instructions when checking your blood sugar with meals:  If your blood sugar is less than 200, inject 7 units of Humalog as you usually do before eating.   If blood sugar between 201-250, inject 8 units of Humalog before eating.    If blood sugar between 251-300, inject 9 units of Humalog before eating.    If blood sugar between 301-350, inject 10 units of Humalog before eating.    If blood sugar between 351-400, inject 11 units of Humalog before eating.    If blood sugar between 401-450, inject 12 units of Humalog before eating.    If your blood sugar is more than 451, inject 13 units of Humalog and call the [facility] clinic.   If you start to experience symptoms of low blood sugar (dizziness, blurry vision, sweatiness, and weakness) call the [facility] clinic.

## 2016-09-28 NOTE — Telephone Encounter (Signed)
Hgb today 7.3 - Dr [name] informed; pt needs 2 units of PRBC's. Appt scheduled w/[name] for tomorrow at 0800 AM. [name] informed of appt.

## 2016-09-28 NOTE — Telephone Encounter (Signed)
Call to [facility] to see how patient was doing with diabetes related skill at home Ms. Staniszewski is also requesting additional services at home to help her with cleaning and cooking.

## 2016-09-29 ENCOUNTER — Ambulatory Visit (HOSPITAL_COMMUNITY)
Admission: RE | Admit: 2016-09-29 | Discharge: 2016-09-29 | Disposition: A | Discharge status: Home / Self Care | Payer: Medicare Other | Source: Ambulatory Visit | Attending: Oncology | Admitting: Oncology

## 2016-09-29 DIAGNOSIS — K31811 Angiodysplasia of stomach and duodenum with bleeding: Secondary | ICD-10-CM | POA: Diagnosis present

## 2016-09-29 DIAGNOSIS — I780 Hereditary hemorrhagic telangiectasia: Secondary | ICD-10-CM | POA: Insufficient documentation

## 2016-09-29 DIAGNOSIS — D500 Iron deficiency anemia secondary to blood loss (chronic): Secondary | ICD-10-CM | POA: Insufficient documentation

## 2016-09-29 MED ORDER — HEPARIN SOD (PORK) LOCK FLUSH 100 UNIT/ML IV SOLN
INTRAVENOUS | Status: AC
  Administered 2016-09-29: 14:00:00 500 [IU]
  Filled 2016-09-29: qty 5

## 2016-09-29 MED ORDER — SODIUM CHLORIDE 0.9 % IV SOLN: Freq: Once | INTRAVENOUS | Status: DC

## 2016-09-29 MED ORDER — SODIUM CHLORIDE 0.9 % IV SOLN
Freq: Once | INTRAVENOUS | Status: DC
Start: 2016-09-29 — End: 2016-09-30

## 2016-09-30 LAB — BPAM RBC
BLOOD PRODUCT EXPIRATION DATE: 201809162359
Blood Product Expiration Date: 201809162359
ISSUE DATE / TIME: 201808211111
ISSUE DATE / TIME: 201808210837
Unit Type and Rh: 6200
Unit Type and Rh: 6200

## 2016-09-30 LAB — TYPE AND SCREEN
ABO/RH(D): A POS
Antibody Screen: NEGATIVE
UNIT DIVISION: 0
UNIT DIVISION: 0

## 2016-10-02 NOTE — Progress Notes (Signed)
Internal Medicine Clinic Attending  I saw and evaluated the patient.  I personally confirmed the key portions of the history and exam documented by Dr. Santos-Sanchez and I reviewed pertinent patient test results.  The assessment, diagnosis, and plan were formulated together and I agree with the documentation in the resident's note. 

## 2016-10-05 ENCOUNTER — Ambulatory Visit (INDEPENDENT_AMBULATORY_CARE_PROVIDER_SITE_OTHER): Payer: Medicare Other | Admitting: Dietician

## 2016-10-05 ENCOUNTER — Other Ambulatory Visit (INDEPENDENT_AMBULATORY_CARE_PROVIDER_SITE_OTHER): Payer: Medicare Other

## 2016-10-05 DIAGNOSIS — K31811 Angiodysplasia of stomach and duodenum with bleeding: Secondary | ICD-10-CM

## 2016-10-05 DIAGNOSIS — E118 Type 2 diabetes mellitus with unspecified complications: Secondary | ICD-10-CM

## 2016-10-05 DIAGNOSIS — E1121 Type 2 diabetes mellitus with diabetic nephropathy: Secondary | ICD-10-CM

## 2016-10-05 DIAGNOSIS — I780 Hereditary hemorrhagic telangiectasia: Secondary | ICD-10-CM

## 2016-10-05 DIAGNOSIS — K31819 Angiodysplasia of stomach and duodenum without bleeding: Secondary | ICD-10-CM

## 2016-10-05 DIAGNOSIS — Z713 Dietary counseling and surveillance: Secondary | ICD-10-CM | POA: Diagnosis not present

## 2016-10-05 DIAGNOSIS — E1165 Type 2 diabetes mellitus with hyperglycemia: Principal | ICD-10-CM

## 2016-10-05 LAB — CBC WITH DIFFERENTIAL/PLATELET
Basophils Absolute: 0 10*3/uL (ref 0.0–0.1)
Basophils Relative: 0 %
EOS PCT: 6 %
Eosinophils Absolute: 0.3 10*3/uL (ref 0.0–0.7)
HEMATOCRIT: 29.2 % — AB (ref 36.0–46.0)
Hemoglobin: 9 g/dL — ABNORMAL LOW (ref 12.0–15.0)
LYMPHS ABS: 0.5 10*3/uL — AB (ref 0.7–4.0)
LYMPHS PCT: 10 %
MCH: 28.6 pg (ref 26.0–34.0)
MCHC: 30.8 g/dL (ref 30.0–36.0)
MCV: 92.7 fL (ref 78.0–100.0)
MONO ABS: 0.3 10*3/uL (ref 0.1–1.0)
Monocytes Relative: 6 %
NEUTROS ABS: 3.8 10*3/uL (ref 1.7–7.7)
Neutrophils Relative %: 78 %
PLATELETS: 167 10*3/uL (ref 150–400)
RBC: 3.15 MIL/uL — ABNORMAL LOW (ref 3.87–5.11)
RDW: 16.9 % — AB (ref 11.5–15.5)
WBC: 4.9 10*3/uL (ref 4.0–10.5)

## 2016-10-05 LAB — SAMPLE TO BLOOD BANK

## 2016-10-05 NOTE — Progress Notes (Signed)
  Medical Nutrition Therapy:  Appt start time: 1115 end time:  1130. Visit # 9  Assessment:  Primary concerns today: glycemic control Met briefly with [name] today to assist with blood sugar control.  She reports that she is eating well, three times a day and takes insulin as ordered. Her  husband assists with insulin. Home health stopped seeing them. [facility] did not return call.  MEDICATIONS: takes lantus 16 units two times a day, Novolog 7 units with breakfast. Lunch and bedtime. TDD: 53 units/day. 32 basal(60%) and 21 bolus(~40%)  BLOOD SUGAR: 30 day meter download shows that she is checking her blood sugar 1,2 and 3 X/day, with average being 2x/day, her blood sugars appear to alternate between being well controlled and elevated, average is 292, range is 92-466 over past 7 days mostly between 200-300. Most fasting in target, highs are lunch and dinner  Progress Towards Goal(s):  In progress.   Nutritional Diagnosis:  [place]-2.2 Altered nutrition-related laboratory As related to high pranidal blood sugar continues as a problem  As evidenced by her meter data    Intervention:  Nutrition education about continuing to be as consistent as possible with meals and insulin.   Coordination of care: could consider daily or weekly  GLP-1 or DPP-4 for asssitance with prandial insulin Teaching Method Utilized: Visual, Auditory, Hands on Handouts given during visit include:AVS Barriers to learning/adherence to lifestyle change: support, memory issues Demonstrated degree of understanding via:  Teach Back   Monitoring/Evaluation:  Dietary intake, exercise, meter, and body weight in 6-8 weeks  in office.  [name], [name], RD 10/05/2016 1:40 PM.

## 2016-10-19 ENCOUNTER — Other Ambulatory Visit (INDEPENDENT_AMBULATORY_CARE_PROVIDER_SITE_OTHER): Payer: Medicare Other

## 2016-10-19 ENCOUNTER — Telehealth: Payer: Self-pay | Admitting: *Deleted

## 2016-10-19 ENCOUNTER — Other Ambulatory Visit: Payer: Self-pay | Admitting: Internal Medicine

## 2016-10-19 DIAGNOSIS — I780 Hereditary hemorrhagic telangiectasia: Secondary | ICD-10-CM | POA: Diagnosis not present

## 2016-10-19 DIAGNOSIS — K31811 Angiodysplasia of stomach and duodenum with bleeding: Secondary | ICD-10-CM

## 2016-10-19 DIAGNOSIS — M62838 Other muscle spasm: Secondary | ICD-10-CM

## 2016-10-19 DIAGNOSIS — K31819 Angiodysplasia of stomach and duodenum without bleeding: Secondary | ICD-10-CM

## 2016-10-19 LAB — CBC WITH DIFFERENTIAL/PLATELET
BASOS PCT: 0 %
Basophils Absolute: 0 10*3/uL (ref 0.0–0.1)
EOS ABS: 0.3 10*3/uL (ref 0.0–0.7)
Eosinophils Relative: 6 %
HEMATOCRIT: 27.6 % — AB (ref 36.0–46.0)
Hemoglobin: 8.4 g/dL — ABNORMAL LOW (ref 12.0–15.0)
LYMPHS ABS: 0.4 10*3/uL — AB (ref 0.7–4.0)
Lymphocytes Relative: 9 %
MCH: 29.6 pg (ref 26.0–34.0)
MCHC: 30.4 g/dL (ref 30.0–36.0)
MCV: 97.2 fL (ref 78.0–100.0)
MONO ABS: 0.6 10*3/uL (ref 0.1–1.0)
MONOS PCT: 12 %
NEUTROS ABS: 3.5 10*3/uL (ref 1.7–7.7)
Neutrophils Relative %: 73 %
Platelets: 180 10*3/uL (ref 150–400)
RBC: 2.84 MIL/uL — ABNORMAL LOW (ref 3.87–5.11)
RDW: 16.3 % — AB (ref 11.5–15.5)
WBC: 4.7 10*3/uL (ref 4.0–10.5)

## 2016-10-19 NOTE — Telephone Encounter (Signed)
-----   Message from [name], MD sent at 10/19/2016 10:10 AM EDT ----- Call pt: blood count OK but will need blood next week - make sure she comes in next Monday for lab & let's try to line up a bed in short stay

## 2016-10-19 NOTE — Telephone Encounter (Signed)
No word back from [facility]. I was given a blood sugar download from lab who patient asked to down load her meter and tell her how she is doing. She's checked her blood sugar 18x/10 days 59x/30 days in the past month: the average is 228, low of 92 and a high of >500 one time, most readings between 150-300, 35% in target, 98 is her Standard deviation.  Called [name] and gave her positive feedback on her meter download. She asked me to ask [name] or [name] about her cramps in her legs, feet, hands, "couldn't go to church yesterday they were so bad". She "cannot deal with them."

## 2016-10-19 NOTE — Telephone Encounter (Signed)
Talked to pt's husband- informed "blood count OK but will need blood next week - make sure she comes in next Monday for lab" per Dr [name]. He stated ok and will let Mrs Bodner know. Talked to [facility] - bed reserved at [facility] stay on Tuesday 9/18 at 0800 AM. I will let her know next week if she needs a transfusion.

## 2016-10-20 ENCOUNTER — Other Ambulatory Visit: Payer: Self-pay | Admitting: Internal Medicine

## 2016-10-20 LAB — SAMPLE TO BLOOD BANK

## 2016-10-20 NOTE — Telephone Encounter (Signed)
Not sure why she is having cramps in her legs and feet.  Would recommend appt with me in [facility] or [facility] appt if she needs to be seen sooner.  Thanks.

## 2016-10-26 ENCOUNTER — Ambulatory Visit (INDEPENDENT_AMBULATORY_CARE_PROVIDER_SITE_OTHER): Payer: Medicare Other | Admitting: Internal Medicine

## 2016-10-26 ENCOUNTER — Other Ambulatory Visit (INDEPENDENT_AMBULATORY_CARE_PROVIDER_SITE_OTHER): Payer: Medicare Other

## 2016-10-26 VITALS — BP 132/44 | HR 57 | Temp 98.3°F | Ht 63.0 in | Wt 154.1 lb

## 2016-10-26 DIAGNOSIS — Z9889 Other specified postprocedural states: Secondary | ICD-10-CM

## 2016-10-26 DIAGNOSIS — I780 Hereditary hemorrhagic telangiectasia: Secondary | ICD-10-CM | POA: Diagnosis not present

## 2016-10-26 DIAGNOSIS — N289 Disorder of kidney and ureter, unspecified: Secondary | ICD-10-CM

## 2016-10-26 DIAGNOSIS — I480 Paroxysmal atrial fibrillation: Secondary | ICD-10-CM

## 2016-10-26 DIAGNOSIS — E1121 Type 2 diabetes mellitus with diabetic nephropathy: Secondary | ICD-10-CM

## 2016-10-26 DIAGNOSIS — K31811 Angiodysplasia of stomach and duodenum with bleeding: Secondary | ICD-10-CM

## 2016-10-26 DIAGNOSIS — M79605 Pain in left leg: Secondary | ICD-10-CM

## 2016-10-26 DIAGNOSIS — E1129 Type 2 diabetes mellitus with other diabetic kidney complication: Secondary | ICD-10-CM | POA: Diagnosis not present

## 2016-10-26 DIAGNOSIS — Q2733 Arteriovenous malformation of digestive system vessel: Secondary | ICD-10-CM

## 2016-10-26 DIAGNOSIS — M79604 Pain in right leg: Secondary | ICD-10-CM

## 2016-10-26 DIAGNOSIS — I110 Hypertensive heart disease with heart failure: Secondary | ICD-10-CM

## 2016-10-26 DIAGNOSIS — I5032 Chronic diastolic (congestive) heart failure: Secondary | ICD-10-CM

## 2016-10-26 DIAGNOSIS — K31819 Angiodysplasia of stomach and duodenum without bleeding: Secondary | ICD-10-CM

## 2016-10-26 DIAGNOSIS — Z794 Long term (current) use of insulin: Secondary | ICD-10-CM

## 2016-10-26 DIAGNOSIS — E1165 Type 2 diabetes mellitus with hyperglycemia: Secondary | ICD-10-CM

## 2016-10-26 DIAGNOSIS — R011 Cardiac murmur, unspecified: Secondary | ICD-10-CM

## 2016-10-26 DIAGNOSIS — Z8639 Personal history of other endocrine, nutritional and metabolic disease: Secondary | ICD-10-CM

## 2016-10-26 LAB — BASIC METABOLIC PANEL
ANION GAP: 7 (ref 5–15)
BUN: 22 mg/dL — AB (ref 6–20)
CO2: 27 mmol/L (ref 22–32)
Calcium: 9.1 mg/dL (ref 8.9–10.3)
Chloride: 107 mmol/L (ref 101–111)
Creatinine, Ser: 1.37 mg/dL — ABNORMAL HIGH (ref 0.44–1.00)
GFR calc Af Amer: 42 mL/min — ABNORMAL LOW (ref >60)
GFR calc non Af Amer: 36 mL/min — ABNORMAL LOW (ref >60)
GLUCOSE: 180 mg/dL — AB (ref 65–99)
POTASSIUM: 3.6 mmol/L (ref 3.5–5.1)
Sodium: 141 mmol/L (ref 135–145)

## 2016-10-26 LAB — CBC WITH DIFFERENTIAL/PLATELET
BASOS PCT: 0 %
Basophils Absolute: 0 10*3/uL (ref 0.0–0.1)
EOS ABS: 0.3 10*3/uL (ref 0.0–0.7)
Eosinophils Relative: 6 %
HEMATOCRIT: 29.2 % — AB (ref 36.0–46.0)
HEMOGLOBIN: 8.9 g/dL — AB (ref 12.0–15.0)
LYMPHS ABS: 0.5 10*3/uL — AB (ref 0.7–4.0)
Lymphocytes Relative: 10 %
MCH: 29.3 pg (ref 26.0–34.0)
MCHC: 30.5 g/dL (ref 30.0–36.0)
MCV: 96.1 fL (ref 78.0–100.0)
Monocytes Absolute: 0.4 10*3/uL (ref 0.1–1.0)
Monocytes Relative: 8 %
NEUTROS PCT: 76 %
Neutro Abs: 3.7 10*3/uL (ref 1.7–7.7)
Platelets: 172 10*3/uL (ref 150–400)
RBC: 3.04 MIL/uL — ABNORMAL LOW (ref 3.87–5.11)
RDW: 15.5 % (ref 11.5–15.5)
WBC: 4.9 10*3/uL (ref 4.0–10.5)

## 2016-10-26 LAB — MAGNESIUM: Magnesium: 1.8 mg/dL (ref 1.7–2.4)

## 2016-10-26 LAB — SAMPLE TO BLOOD BANK

## 2016-10-26 MED ORDER — INSULIN LISPRO 100 UNIT/ML (KWIKPEN): 10.0000 [IU] | PEN_INJECTOR | Freq: Three times a day (TID) | SUBCUTANEOUS | 3 refills | Status: DC

## 2016-10-26 MED ORDER — B COMPLEX VITAMINS PO CAPS: 1.0000 | ORAL_CAPSULE | Freq: Every day | ORAL | 2 refills | Status: DC

## 2016-10-26 NOTE — Patient Instructions (Addendum)
For your diabetes: --keep taking lantus 16 units twice a day --increase your mealtime novolog to 10 units with breakfast, lunch and dinner --keep checking your blood sugars first thing in the morning and 2-3 times throughout the day.  For your cramps, we are checking some blood work and doing an ultrasound to look at your blood flow.  Start taking vitamin B complex (pharmacy can help you find it, over the counter) every day.  Please make an appointment on Oct 4th to see Dr. Juleen China for follow up on your diabetes and leg cramps.

## 2016-10-26 NOTE — Telephone Encounter (Signed)
-----   Message from [name], MD sent at 10/26/2016  2:23 PM EDT ----- Call pt: blood count actually up a [place] compared with last week - she does not need transfusion this week

## 2016-10-26 NOTE — Assessment & Plan Note (Signed)
Patient has had several visits recently to better control her DM. Due to her chronic blood loss and frequent transfusions, her A1c is not a reliable indicator of glucose control. She was previously on metformin which was discontinued due to GI upset; and was on glipizide until recently due to adjustments in her insulin. Her current insulin regimen is Lantus 16 units BID and Novolog 7 units TID with meals (+SS). Patient states that she is compliant with Lantus BID dosing and thinks she takes novolog 7 units 2 or 3 times a day. She brings in her meter today which shows AM CBGs ranging from 90-240s, and PM CBGs ranging from 230-undetectable. She denies polyuria, polydipsia, polyphagia.  Plan: --continue lantus 16 units BID --increase novolog to 10 units TID with meals --consider V-go or additional oral therapy in the future for better glucose control --continue CBG monitoring and advised patient to bring in meter to each appointment --f/u with PCP in 1 month

## 2016-10-26 NOTE — Assessment & Plan Note (Addendum)
Patient states that she has had progressive leg cramps in both LEs for months now. She states that the cramps are exacerbated by movement which have now limited her activity; she endorses some relief with rest but even mild movement will exacerbate her pain. She states most of the pain is in her calves but she also has it occasionally in her thighs. She was previously evaluated for bil LE pain which at that time was mainly nocturnal and in setting of otherwise treated vit D deficiency and normal labwork, was thought to be due to iron-deficiency anemia and restless leg syndrome. 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 She was also given a trial of baclofen which did not improve her symptoms. She states that her symptoms now are not alleviated following a transfusion and has not noticed much improvement with pramipexole.  Though initially her symptoms were more consistent with RLS likely exacerbated by iron deficiency, her current symptoms are concerning for PAD related claudication.  Exam shows diminished bil DP and PT pulses. ABI's performed in office: L 0.66, R 0.73.  Plan: --referral placed to vascular surgery for further evaluation and management of PAD --f/u BMet, Mag, vit D level to evaluate for other causes that may be contributing to her pain --advised patient to take OTC vit B complex as it has been shown to help with muscle cramps

## 2016-10-26 NOTE — Telephone Encounter (Signed)
Pt is here at clinic with her husband; pt informed no blood transfusion this week per Dr [name] - pt informed Hgb up to 8.9 from 8.4. Also called [name], short stay, informed to cancel appt for tomorrow.

## 2016-10-26 NOTE — Progress Notes (Signed)
CC: leg cramps  HPI:  [name] is a 76 y.o. with a PMH of T2DM, HHT with frequent RBC transfusions, HTN, paroxysmal a fib, diastolic CHF presenting to clinic for evaluation of bilateral leg cramps.  Patient states that she has had progressive leg cramps in both LEs for months now. She states that the cramps are exacerbated by movement which have now limited her activity; she endorses some relief with rest but even mild movement will exacerbate her pain. She states most of the pain is in her calves but she also has it occasionally in her thighs. She was previously evaluated for bil LE pain which at that time was mainly nocturnal and in setting of otherwise treated vit D deficiency and normal labwork, was thought to be due to iron-deficiency anemia and restless leg syndrome. 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 She was also given a trial of baclofen which did not improve her symptoms. She states that her symptoms now are not alleviated following a transfusion and has not noticed much improvement with pramipexole.   Patient has had several visits recently to better control her DM. Due to her chronic blood loss and frequent transfusions, her A1c is not a reliable indicator of glucose control. She was previously on metformin which was discontinued due to GI upset; and was on glipizide until recently due to adjustments in her insulin. Her current insulin regimen is Lantus 16 units BID and Novolog 7 units TID with meals (+SS). Patient states that she is compliant with Lantus BID dosing and thinks she takes novolog 7 units 2 or 3 times a day. She brings in her meter today which shows AM CBGs ranging from 90-240s, and PM CBGs ranging from 230-undetectable. She denies polyuria, polydipsia, polyphagia.  Otherwise patient denies nausea, vomiting, diarrhea, chest pain, shortness of breath, blurry vision,  numbness/tingling.  Please see problem based Assessment and Plan for status of patients chronic conditions.  Past Medical History:  Diagnosis Date  . CHF (congestive heart failure) ([place])   . Chronic anemia   . Chronic diastolic CHF (congestive heart failure) ([place]) 10/03/2013  . Chronic GI bleeding    [name] 11/29/2014  . Family history of anesthesia complication    "niece has a hard time coming out" (09/15/2012)  . Frequent nosebleeds    chronic  . Gastric AV malformation    [name] 11/29/2014  . GERD (gastroesophageal reflux disease)   . Heart murmur   . History of blood transfusion "several"  . History of epistaxis   . HTN (hypertension), benign 03/02/2012  . Hyperlipidemia   . Iron deficiency anemia    chronic infusions"  . Lichen planus    Both lower extremities  . 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 Pneumonia 1990's X 2  . Seizures ([place]) 09/2014  . Telangiectasia    Gastric   . 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    Review of Systems:   ROS Per HPI  Physical Exam:  Vitals:   10/26/16 1401  BP: (!) 132/44  Pulse: (!) 57  Temp: 98.3 F (36.8 C)  TempSrc: Oral  SpO2: 98%  Weight: 154 lb 1.6 oz (69.9 kg)  Height: 5\' 3"  (1.6 m)   GENERAL- alert, co-operative, appears as stated age, not in any distress. HEENT- Atraumatic, normocephalic, oral mucosa appears moist CARDIAC- RRR, systolic murmur best heard at  RUSB, no rubs or gallops. RESP- Moving equal volumes of air, and clear to auscultation bilaterally, no wheezes or crackles. ABDOMEN- Soft, nontender, bowel sounds present. NEURO- No obvious Cr N abnormality. EXTREMITIES- diminished DP and PT pulses bilaterally; 2+ radial pulses bilaterally. Minimal LE edema.  SKIN- Warm, dry. PSYCH- Normal mood and affect, appropriate thought content and speech.  Assessment & Plan:   See Encounters Tab for problem based charting.   Patient discussed with Dr. Angelia Mould   Alphonzo Grieve, MD Internal Medicine PGY2

## 2016-10-27 ENCOUNTER — Inpatient Hospital Stay (HOSPITAL_COMMUNITY): Admission: RE | Admit: 2016-10-27 | Payer: Medicare Other | Source: Ambulatory Visit

## 2016-10-27 LAB — VITAMIN D 25 HYDROXY (VIT D DEFICIENCY, FRACTURES): VIT D 25 HYDROXY: 52.3 ng/mL (ref 30.0–100.0)

## 2016-10-27 NOTE — Progress Notes (Signed)
Internal Medicine Clinic Attending  Case discussed with Dr. Svalina  at the time of the visit.  We reviewed the resident's history and exam and pertinent patient test results.  I agree with the assessment, diagnosis, and plan of care documented in the resident's note.  

## 2016-11-02 ENCOUNTER — Telehealth: Payer: Self-pay | Admitting: *Deleted

## 2016-11-02 ENCOUNTER — Other Ambulatory Visit (INDEPENDENT_AMBULATORY_CARE_PROVIDER_SITE_OTHER): Payer: Medicare Other

## 2016-11-02 DIAGNOSIS — I780 Hereditary hemorrhagic telangiectasia: Secondary | ICD-10-CM | POA: Diagnosis not present

## 2016-11-02 DIAGNOSIS — K31811 Angiodysplasia of stomach and duodenum with bleeding: Secondary | ICD-10-CM

## 2016-11-02 DIAGNOSIS — K31819 Angiodysplasia of stomach and duodenum without bleeding: Secondary | ICD-10-CM

## 2016-11-02 LAB — CBC WITH DIFFERENTIAL/PLATELET
Basophils Absolute: 0 10*3/uL (ref 0.0–0.1)
Basophils Relative: 0 %
EOS PCT: 8 %
Eosinophils Absolute: 0.4 10*3/uL (ref 0.0–0.7)
HCT: 30 % — ABNORMAL LOW (ref 36.0–46.0)
Hemoglobin: 9.2 g/dL — ABNORMAL LOW (ref 12.0–15.0)
LYMPHS ABS: 0.7 10*3/uL (ref 0.7–4.0)
LYMPHS PCT: 12 %
MCH: 29.5 pg (ref 26.0–34.0)
MCHC: 30.7 g/dL (ref 30.0–36.0)
MCV: 96.2 fL (ref 78.0–100.0)
MONO ABS: 0.4 10*3/uL (ref 0.1–1.0)
Monocytes Relative: 8 %
Neutro Abs: 3.9 10*3/uL (ref 1.7–7.7)
Neutrophils Relative %: 72 %
PLATELETS: 189 10*3/uL (ref 150–400)
RBC: 3.12 MIL/uL — AB (ref 3.87–5.11)
RDW: 14.8 % (ref 11.5–15.5)
WBC: 5.4 10*3/uL (ref 4.0–10.5)

## 2016-11-02 LAB — SAMPLE TO BLOOD BANK

## 2016-11-02 NOTE — Telephone Encounter (Signed)
-----   Message from [name], MD sent at 11/02/2016 10:31 AM EDT ----- Call pt: blood count good again today no transfusion this week

## 2016-11-02 NOTE — Telephone Encounter (Signed)
Called pt - no - will try later.

## 2016-11-02 NOTE — Telephone Encounter (Signed)
Pt informed "blood count good again today (@9 .2) no transfusion this week ' per Dr [name]. Pt very happy.

## 2016-11-03 ENCOUNTER — Encounter (HOSPITAL_COMMUNITY): Payer: Medicare Other

## 2016-11-09 ENCOUNTER — Telehealth: Payer: Self-pay | Admitting: *Deleted

## 2016-11-09 ENCOUNTER — Other Ambulatory Visit (INDEPENDENT_AMBULATORY_CARE_PROVIDER_SITE_OTHER): Payer: Medicare Other

## 2016-11-09 DIAGNOSIS — K31819 Angiodysplasia of stomach and duodenum without bleeding: Secondary | ICD-10-CM

## 2016-11-09 DIAGNOSIS — K31811 Angiodysplasia of stomach and duodenum with bleeding: Secondary | ICD-10-CM

## 2016-11-09 DIAGNOSIS — I780 Hereditary hemorrhagic telangiectasia: Secondary | ICD-10-CM

## 2016-11-09 LAB — CBC WITH DIFFERENTIAL/PLATELET
BASOS PCT: 0 %
Basophils Absolute: 0 10*3/uL (ref 0.0–0.1)
EOS ABS: 0.4 10*3/uL (ref 0.0–0.7)
EOS PCT: 8 %
HCT: 28 % — ABNORMAL LOW (ref 36.0–46.0)
Hemoglobin: 8.5 g/dL — ABNORMAL LOW (ref 12.0–15.0)
Lymphocytes Relative: 11 %
Lymphs Abs: 0.6 10*3/uL — ABNORMAL LOW (ref 0.7–4.0)
MCH: 29 pg (ref 26.0–34.0)
MCHC: 30.4 g/dL (ref 30.0–36.0)
MCV: 95.6 fL (ref 78.0–100.0)
MONO ABS: 0.6 10*3/uL (ref 0.1–1.0)
MONOS PCT: 11 %
Neutro Abs: 3.7 10*3/uL (ref 1.7–7.7)
Neutrophils Relative %: 70 %
Platelets: 256 10*3/uL (ref 150–400)
RBC: 2.93 MIL/uL — ABNORMAL LOW (ref 3.87–5.11)
RDW: 13.9 % (ref 11.5–15.5)
WBC: 5.5 10*3/uL (ref 4.0–10.5)

## 2016-11-09 LAB — SAMPLE TO BLOOD BANK

## 2016-11-09 LAB — FERRITIN: Ferritin: 11 ng/mL (ref 11–307)

## 2016-11-09 NOTE — Telephone Encounter (Signed)
Pt called / informed "blood count OK. No transfusion this week but will lileky need next week. Call if she sees any bleeding in bowel movement this week otherwise, come back for lab next Monday" per Dr [name]. Stated ok; informed hgb 8.5 from 9.2.

## 2016-11-09 NOTE — Telephone Encounter (Signed)
-----   Message from [name], MD sent at 11/09/2016  1:17 PM EDT ----- Call pt: blood count OK. No transfusion this week but will lileky need next week.  Call if she sees any bleeding in bowel movement this week otherwise, come back for lab next Monday

## 2016-11-10 ENCOUNTER — Ambulatory Visit (INDEPENDENT_AMBULATORY_CARE_PROVIDER_SITE_OTHER): Payer: Medicare Other | Admitting: Neurology

## 2016-11-10 ENCOUNTER — Encounter: Payer: Self-pay | Admitting: Neurology

## 2016-11-10 VITALS — BP 144/50 | HR 76 | Ht 63.0 in | Wt 156.0 lb

## 2016-11-10 DIAGNOSIS — R413 Other amnesia: Secondary | ICD-10-CM | POA: Diagnosis not present

## 2016-11-10 DIAGNOSIS — G40309 Generalized idiopathic epilepsy and epileptic syndromes, not intractable, without status epilepticus: Secondary | ICD-10-CM

## 2016-11-10 MED ORDER — DONEPEZIL HCL 10 MG PO TABS: 10.0000 mg | ORAL_TABLET | Freq: Every day | ORAL | 4 refills | Status: DC

## 2016-11-10 MED ORDER — LEVETIRACETAM 500 MG PO TABS: 500.0000 mg | ORAL_TABLET | Freq: Two times a day (BID) | ORAL | 4 refills | Status: DC

## 2016-11-10 MED ORDER — MEMANTINE HCL 10 MG PO TABS: 10.0000 mg | ORAL_TABLET | Freq: Two times a day (BID) | ORAL | 4 refills | Status: DC

## 2016-11-10 NOTE — Progress Notes (Signed)
[facility]    Provider:  Dr Jaynee Eagles Referring Provider: [name], DO Primary Care Physician:  [name], DO  CC:  Seizures and memory loss  Interval History 11/10/2016: She is doing well, memory is stable, no new deficits, no falls, her legs hurt her and she is going to have a stent in her leg for artery disease. She is not depressed, mood is good, she feels good. No difficulty swallowing.  HISTORY OF PRESENT ILLNESS: [name] is a 76 year old female with a history of memory disturbance and seizures. She returns today for follow-up. She reports that she has been doing well. Denies any seizure events. She continues on Keppra 500 mg twice a day. She reports that her memory varies. She lives at home with her husband. He is able to complete all ADLs independently. Her husband handles all the finances. He also helps her oversee her medication and appointments. She does not operate a motor vehicle. She is able to cook without difficulty. She states that some nights she is unable to sleep well. Denies any hallucinations. She returns today for an evaluation.  HISTORY 11/07/15: [name] is a 76 year old female with a history of memory disturbance. She returns today for follow-up. She feels that her memory has remained stable. She continues on Aricept. At the last visit she was started on Namenda. Reports that she is tolerating this well. She denies any seizure events. She remains on Keppra. She is able to complete most ADLs without assistance. She does not operate a motor vehicle. Denies any changes with her gait or balance. Denies any changes with her mood or behavior. Patient's husband is in the lobby but does not want to come to the exam room? Patient reports that she was admitted to the hospital in August due to a GI bleed. She denies any other medical history. Returns today for an evaluation.  08/07/15 (MM) : [name] is a 76 year old female with a history of  memory disturbance. She returns today for follow-up. She states that her memory has remained stable. She is tolerating Aricept well. She lives at home with her husband. She states that Keppra does make her sleepy throughout the day. She is not had any seizure events. She does not operate a motor vehicle. She does prepare meals without difficulty. She states that she is trying to get Meals on Wheels but is on the wait list. She returns today for an evaluation.  01/30/15 [facility]): She is more forgetful. She forgets things she says, by the time she tella her husband something she has forgotten. They live independently. Pays all the bills, not skipping bills. No accidents in the home. She is losing things. Misplacing. Forgets appointments. Difficulty with organization more. Can't get herself together. She sleeps a lot. No dementia, no alzheimers in the family.   HPI: [name] is a 76 y.o. female here as a referral from Dr. Jeanie Cooks for seizure in the setting of hyperammonemia. She has a past medical history of hypertension, congestive heart failure, diabetes, acute encephalopathy with status epilepticus, acute renal failure superimposed on stage III chronic kidney disease, urinary tract infections, hyperammonemia.  Patient here with husband. She was admitted with an episode of seizure-like activity. Husband provides assist of the information. He describes the incident. She was shaking her hands and head, trying to catch her breath. All information provided by husband. Lasted a few minutes. She was not responsive. It was on a Tuesday. Then she slept for 5 hours afterwards.  Then she vomited while sleeping and then they called 911 and went to the emergency room. This is not the first time it has happened. It has happened in the past. Husband had seen the jerking of the limbs and then she would sleep a while and wake up. He says he has seen in twice in the last year. No family history of seizures. She is much  better now. Therapy is coming to her house. Not having any side effects from the medication. She has had episodes of AMS and confusion in the past.   Reviewed notes, labs and imaging from outside physicians, which showed:  MRI of the brain 08/2014 personally reviewed images and agree with following report. : 1. No acute infarct identified. 2. Increased basal ganglia intrinsic T1 signal which can be seen with hepatic insufficiency, cirrhosis, portosystemic shunt, portal vein thrombosis, total parenteral nutrition, and is less often seen with hyperglycemia or other metabolic disturbances such as Wilson's disease. Correlation with serum ammonia levels and liver function tests is recommended. 3. Advanced but nonspecific cerebral white matter signal changes. Most commonly this is due to chronic small vessel disease. Other considerations include hypercoagulable state, vasculitis, , prior infection or demyelination.  CBC showed anemia, anemia while hospitalized 327. Hepatic function was normal. One month previous she had a GI bleed. Lactulose was started for elevated ammonia. An EEG was ordered which was concerning for frontal status epilepticus. Hyperammonemia was thought to be the driver of the seizures however she was started on Keppra. She was discharged with the diagnosis of acute encephalopathy and hyperammonemia, she was discharged on Keppra 500 mg twice daily.  Patient presented to the emergency room in late August with altered mental status and lethargy. CT of the head imaging was unremarkable patient was afebrile, no leukocytosis, ammonia was 327, BUN was 32 and creatinine was 1.38.  Review of Systems: Patient complains of symptoms per HPI as well as the following symptoms: No CP, No SOB. Pertinent negatives per HPI. All others negative.  Social History   Social History  . Marital status: Married    Spouse name: [name]  . Number of children: 0  . Years of education: 9   Occupational  History  . Retired Engineer, manufacturing systems Retired   Social History Main Topics  . Smoking status: Former Smoker    Packs/day: 1.00    Years: 20.00    Types: Cigarettes    Quit date: 02/10/1971  . Smokeless tobacco: Never Used     Comment: 09/15/2012 "smoked 50-60 yr ago"  . Alcohol use No  . Drug use: No  . Sexual activity: Not on file   Other Topics Concern  . Not on file   Social History Narrative   Married, lives with husband, [name].  Ambulates without assistance.     Caffeine use: none    Family History  Problem Relation Age of Onset  . Stroke Father   . Breast cancer Unknown   . Malignant hyperthermia Neg Hx   . Seizures Neg Hx     Past Medical History:  Diagnosis Date  . CHF (congestive heart failure) ([place])   . Chronic anemia   . Chronic diastolic CHF (congestive heart failure) ([place]) 10/03/2013  . Chronic GI bleeding    [name] 11/29/2014  . Family history of anesthesia complication    "niece has a hard time coming out" (09/15/2012)  . Frequent nosebleeds    chronic  . Gastric AV malformation    [name] 11/29/2014  .  GERD (gastroesophageal reflux disease)   . Heart murmur   . History of blood transfusion "several"  . History of epistaxis   . HTN (hypertension), benign 03/02/2012  . Hyperlipidemia   . Iron deficiency anemia    chronic infusions"  . Lichen planus    Both lower extremities  . 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 Pneumonia 1990's X 2  . Seizures ([place]) 09/2014  . Telangiectasia    Gastric   . 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    Past Surgical History:  Procedure Laterality Date  . CATARACT EXTRACTION     "I think it was just one eye"  . ESOPHAGOGASTRODUODENOSCOPY  02/26/2011   Procedure: ESOPHAGOGASTRODUODENOSCOPY (EGD);  Surgeon: [name], MD;  Location: [name];  Service: Endoscopy;  Laterality: N/A;  . ESOPHAGOGASTRODUODENOSCOPY N/A 11/08/2012   Procedure: ESOPHAGOGASTRODUODENOSCOPY (EGD);  Surgeon:  [name], MD;  Location: [name];  Service: Endoscopy;  Laterality: N/A;  . ESOPHAGOGASTRODUODENOSCOPY N/A 10/04/2013   Procedure: ESOPHAGOGASTRODUODENOSCOPY (EGD);  Surgeon: Winfield Cunas., MD;  Location: [name];  Service: Endoscopy;  Laterality: N/A;  with APC on stand-by  . ESOPHAGOGASTRODUODENOSCOPY N/A 07/06/2014   Procedure: ESOPHAGOGASTRODUODENOSCOPY (EGD);  Surgeon: Clarene Essex, MD;  Location: [name];  Service: Endoscopy;  Laterality: N/A;  . ESOPHAGOGASTRODUODENOSCOPY N/A 09/05/2014   Procedure: ESOPHAGOGASTRODUODENOSCOPY (EGD);  Surgeon: Laurence Spates, MD;  Location: [name];  Service: Endoscopy;  Laterality: N/A;  APC on standby to control bleeding  . ESOPHAGOGASTRODUODENOSCOPY N/A 11/29/2014   Procedure: ESOPHAGOGASTRODUODENOSCOPY (EGD);  Surgeon: [name], MD;  Location: [facility] ENDOSCOPY;  Service: Endoscopy;  Laterality: N/A;  . ESOPHAGOGASTRODUODENOSCOPY N/A 09/28/2015   Procedure: ESOPHAGOGASTRODUODENOSCOPY (EGD);  Surgeon: Clarene Essex, MD;  Location: [facility] ENDOSCOPY;  Service: Endoscopy;  Laterality: N/A;  . ESOPHAGOGASTRODUODENOSCOPY ENDOSCOPY  08/19/2006   with laser treatment  . HOT HEMOSTASIS N/A 07/06/2014   Procedure: HOT HEMOSTASIS (ARGON PLASMA COAGULATION/BICAP);  Surgeon: Clarene Essex, MD;  Location: [name];  Service: Endoscopy;  Laterality: N/A;  . HOT HEMOSTASIS N/A 09/28/2015   Procedure: HOT HEMOSTASIS (ARGON PLASMA COAGULATION/BICAP);  Surgeon: Clarene Essex, MD;  Location: [facility] ENDOSCOPY;  Service: Endoscopy;  Laterality: N/A;  . NASAL HEMORRHAGE CONTROL     "for bleeding"   . SAVORY DILATION  02/26/2011   Procedure: SAVORY DILATION;  Surgeon: [name], MD;  Location: [name];  Service: Endoscopy;  Laterality: N/A;  c-arm needed    Current Outpatient Prescriptions  Medication Sig Dispense Refill  . AMITIZA 24 MCG capsule Take 24 mcg by mouth 2 (two) times daily.    Marland Kitchen b complex vitamins capsule Take 1 capsule by mouth daily.  100 capsule 2  . Blood Glucose Monitoring Suppl (ONETOUCH VERIO) w/Device KIT     . calcium citrate-vitamin D (CITRACAL+D) 315-200 MG-UNIT tablet Take 1 tablet by mouth 2 (two) times daily. 100 tablet 0  . cetaphil (CETAPHIL) lotion Apply 1 application topically as needed for dry skin. 236 mL 0  . donepezil (ARICEPT) 10 MG tablet Take 1 tablet (10 mg total) by mouth at bedtime. 90 tablet 4  . Elastic Bandages & Supports (MEDICAL COMPRESSION STOCKINGS) MISC Wear as much as possible while awake to reduce swelling 2 each 0  . FERROCITE 324 MG TABS tablet TAKE ONE TABLET BY MOUTH TWICE DAILY 180 tablet 3  . furosemide (LASIX) 20 MG tablet Take 1 tablet (20 mg total) by mouth daily. 30 tablet 5  . glucose blood (  ONETOUCH VERIO) test strip Use as instructed 3-4 times daily. E11.29, insulin requiring 100 each 12  . Insulin Glargine (LANTUS) 100 UNIT/ML Solostar Pen Inject 16 Units into the skin 2 (two) times daily. 15 mL 2  . insulin lispro (HUMALOG) 100 UNIT/ML KiwkPen Inject 0.1 mLs (10 Units total) into the skin 3 (three) times daily. 15 mL 3  . Insulin Pen Needle 31G X 5 MM MISC Use to inject insulin 100 each 11  . lactulose (CHRONULAC) 10 GM/15ML solution Take 15 mLs (10 g total) by mouth 2 (two) times daily. 473 mL 2  . levETIRAcetam (KEPPRA) 500 MG tablet Take 1 tablet (500 mg total) by mouth 2 (two) times daily. 180 tablet 4  . memantine (NAMENDA) 10 MG tablet Take 1 tablet (10 mg total) by mouth 2 (two) times daily. 180 tablet 4  . ONETOUCH DELICA LANCETS FINE MISC Check blood sugar up to 3 times a day 100 each 12  . oxymetazoline (AFRIN) 0.05 % nasal spray Place 1 spray into both nostrils 2 (two) times daily as needed (Epistaxis). 30 mL 0  . pantoprazole (PROTONIX) 40 MG tablet Take 1 tablet (40 mg total) by mouth 2 (two) times daily. 60 tablet 1  . potassium chloride SA (KLOR-CON M20) 20 MEQ tablet TAKE 1 TABLET BY MOUTH ONCE DAILY 30 tablet 5  . pramipexole (MIRAPEX) 0.125 MG tablet Take 1  tablet (0.125 mg total) by mouth daily. 90 tablet 2  . sodium chloride (OCEAN) 0.65 % SOLN nasal spray Place 1 spray into both nostrils as needed for congestion. 1 Bottle 5   No current facility-administered medications for this visit.     Allergies as of 11/10/2016 - Review Complete 11/10/2016  Allergen Reaction Noted  . Aspirin Nausea And Vomiting 05/12/2006    Vitals: BP (!) 144/50   Pulse 76   Ht '5\' 3"'$  (1.6 m)   Wt 156 lb (70.8 kg)   LMP  (LMP Unknown)   BMI 27.63 kg/m  Last Weight:  Wt Readings from Last 1 Encounters:  11/10/16 156 lb (70.8 kg)   Last Height:   Ht Readings from Last 1 Encounters:  11/10/16 '5\' 3"'$  (1.6 m)   MMSE - Mini Mental State Exam 11/10/2016 05/06/2016 11/07/2015  Orientation to time '4 3 3  '$ Orientation to Place '3 4 4  '$ Registration '3 3 3  '$ Attention/ Calculation 0 0 2  Recall '2 1 1  '$ Language- name 2 objects '2 2 2  '$ Language- repeat 1 1 0  Language- follow 3 step command '3 3 3  '$ Language- read & follow direction '1 1 1  '$ Write a sentence '1 1 1  '$ Copy design 0 0 0  Total score '20 19 20       '$ Assessment/Plan: 76 y.o. female here as a referral for seizure in the setting of hyperammonemia(2016) and memory loss. She has a past medical history of hypertension, congestive heart failure, diabetes, acute encephalopathy with status epilepticus, acute renal failure superimposed on stage III chronic kidney disease, urinary tract infections. 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 She was started on Keppra. It was thought that the hyperammonemia was likely the driving factor for for this seizure which could be considered provoked however husband previously says he has witnessed other seizures in the past so at this point I do feel she should stay on the [place] since she is tolerating Keppra well. Also c/o memory loss in the past. Her MoCA was 15/30 and MMSE stable 19-20/30.  Doing well on Aricept and Namenda. F/u one year  Sarina Ill,  MD  [facility] [address] [place], [place] [address]  Phone [phone] Fax [phone]  A total of 15 minutes was spent face-to-face with this patient. Over half this time was spent on counseling patient on the seizure, memory loss diagnosis and different diagnostic and therapeutic options available.

## 2016-11-11 ENCOUNTER — Telehealth: Payer: Self-pay | Admitting: *Deleted

## 2016-11-11 NOTE — Telephone Encounter (Signed)
[name] had not received new rx for Humalog Kwikpen 10 units TID which was written on 10/26/16 per Dr Jari Favre (no print). Verbal order given to pharmacist.

## 2016-11-11 NOTE — Telephone Encounter (Signed)
Thank you!  [name] 

## 2016-11-12 ENCOUNTER — Ambulatory Visit (INDEPENDENT_AMBULATORY_CARE_PROVIDER_SITE_OTHER): Payer: Medicare Other | Admitting: Internal Medicine

## 2016-11-12 ENCOUNTER — Encounter: Payer: Self-pay | Admitting: Internal Medicine

## 2016-11-12 DIAGNOSIS — E1165 Type 2 diabetes mellitus with hyperglycemia: Secondary | ICD-10-CM

## 2016-11-12 DIAGNOSIS — E1151 Type 2 diabetes mellitus with diabetic peripheral angiopathy without gangrene: Secondary | ICD-10-CM | POA: Diagnosis not present

## 2016-11-12 DIAGNOSIS — E785 Hyperlipidemia, unspecified: Secondary | ICD-10-CM

## 2016-11-12 DIAGNOSIS — I5032 Chronic diastolic (congestive) heart failure: Secondary | ICD-10-CM

## 2016-11-12 DIAGNOSIS — I110 Hypertensive heart disease with heart failure: Secondary | ICD-10-CM

## 2016-11-12 DIAGNOSIS — I739 Peripheral vascular disease, unspecified: Secondary | ICD-10-CM

## 2016-11-12 DIAGNOSIS — E1129 Type 2 diabetes mellitus with other diabetic kidney complication: Secondary | ICD-10-CM

## 2016-11-12 DIAGNOSIS — IMO0002 Reserved for concepts with insufficient information to code with codable children: Secondary | ICD-10-CM

## 2016-11-12 DIAGNOSIS — Z794 Long term (current) use of insulin: Secondary | ICD-10-CM

## 2016-11-12 LAB — GLUCOSE, CAPILLARY: GLUCOSE-CAPILLARY: 225 mg/dL — AB (ref 65–99)

## 2016-11-12 MED ORDER — INSULIN LISPRO 100 UNIT/ML (KWIKPEN): PEN_INJECTOR | SUBCUTANEOUS | 3 refills | Status: DC

## 2016-11-12 NOTE — Progress Notes (Signed)
   CC: here for PVD and DM f/u  HPI:  [name] is a 76 y.o. woman with a past medical history listed below here today for follow up of her PVD and DM.  For details of today's visit and the status of her chronic medical issues please refer to the assessment and plan.   Past Medical History:  Diagnosis Date  . CHF (congestive heart failure) ([place])   . Chronic anemia   . Chronic diastolic CHF (congestive heart failure) ([place]) 10/03/2013  . Chronic GI bleeding    [name] 11/29/2014  . Family history of anesthesia complication    "niece has a hard time coming out" (09/15/2012)  . Frequent nosebleeds    chronic  . Gastric AV malformation    [name] 11/29/2014  . GERD (gastroesophageal reflux disease)   . Heart murmur   . History of blood transfusion "several"  . History of epistaxis   . HTN (hypertension), benign 03/02/2012  . Hyperlipidemia   . Iron deficiency anemia    chronic infusions"  . Lichen planus    Both lower extremities  . 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 Pneumonia 1990's X 2  . Seizures ([place]) 09/2014  . Telangiectasia    Gastric   . 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   Review of Systems:  Please see pertinent ROS reviewed in HPI and problem based charting.   Physical Exam:  Vitals:   11/12/16 1507  BP: (!) 131/35  Pulse: 61  Temp: 98.1 F (36.7 C)  TempSrc: Oral  SpO2: 99%  Weight: 155 lb (70.3 kg)  Height: 5\' 3"  (1.6 m)   General: NAD HEENT: NCAT, EOMI, no scleral icterus Pulm: normal effort Ext: warm and well perfused, diminished DP and PT pulses, no pedal edema Neuro: alert and oriented X3, cranial nerves II-XII grossly intact   Assessment & Plan:   See Encounters Tab for problem based charting.  Patient discussed with Dr. Daryll Drown.  Peripheral vascular disease ([place]) Assessment: She has follow up scheduled later this month with vascular surgery after in-office ABI's were found to be abnormal.  She  reports that her night time leg pain symptoms have been better the past few months for which she still uses pramipexole for.  She is still having claudication symptoms with activity.  On exam, her pulses are present but diminished.  She has warm and perfused lower extremities.  Plan: - Further follow up with vascular surgery for PVD management.  DM (diabetes mellitus), type 2, uncontrolled, with renal complications ([place]) Assessment: Patient seen today for follow up of her diabetes.  She has had several visits recently to bettter control her diabetes in the setting of frequent blood transfusions and an unreliable Hgb A1c.  She reports taking her Lantus 16 units BID and mealtime insulin 10 units TID.  She brought her meter today which shows an average glucose of 230 and consistently elevated PM readings.  Plan: - Continue Lantus 16 units BID - Continue meal time 10 units at breakfast and lunch.  Increase evening dose to 12 units.   - RTC 1 month

## 2016-11-12 NOTE — Patient Instructions (Addendum)
Thank you for coming to see me today. It was a pleasure. Today we talked about:   Diabetes Continue to take Lantus 16 units twice per day Increase your evening meal time insulin to 12 units.  Take the other 2 doses at 10 units  Please follow-up with me in 1 months  If you have any questions or concerns, please do not hesitate to call the office at [phone].  Take Care,   [name], DO

## 2016-11-13 ENCOUNTER — Encounter: Payer: Self-pay | Admitting: Dietician

## 2016-11-13 NOTE — Assessment & Plan Note (Signed)
Assessment: She has follow up scheduled later this month with vascular surgery after in-office ABI's were found to be abnormal.  She reports that her night time leg pain symptoms have been better the past few months for which she still uses pramipexole for.  She is still having claudication symptoms with activity.  On exam, her pulses are present but diminished.  She has warm and perfused lower extremities.  Plan: - Further follow up with vascular surgery for PVD management.

## 2016-11-13 NOTE — Assessment & Plan Note (Signed)
Assessment: Patient seen today for follow up of her diabetes.  She has had several visits recently to bettter control her diabetes in the setting of frequent blood transfusions and an unreliable Hgb A1c.  She reports taking her Lantus 16 units BID and mealtime insulin 10 units TID.  She brought her meter today which shows an average glucose of 230 and consistently elevated PM readings.  Plan: - Continue Lantus 16 units BID - Continue meal time 10 units at breakfast and lunch.  Increase evening dose to 12 units.   - RTC 1 month

## 2016-11-16 ENCOUNTER — Other Ambulatory Visit: Payer: Self-pay | Admitting: Oncology

## 2016-11-16 ENCOUNTER — Other Ambulatory Visit (INDEPENDENT_AMBULATORY_CARE_PROVIDER_SITE_OTHER): Payer: Medicare Other

## 2016-11-16 ENCOUNTER — Other Ambulatory Visit: Payer: Self-pay | Admitting: Internal Medicine

## 2016-11-16 DIAGNOSIS — I780 Hereditary hemorrhagic telangiectasia: Secondary | ICD-10-CM | POA: Diagnosis not present

## 2016-11-16 DIAGNOSIS — K31811 Angiodysplasia of stomach and duodenum with bleeding: Secondary | ICD-10-CM

## 2016-11-16 DIAGNOSIS — K31819 Angiodysplasia of stomach and duodenum without bleeding: Secondary | ICD-10-CM

## 2016-11-16 LAB — CBC WITH DIFFERENTIAL/PLATELET
BASOS PCT: 0 %
Basophils Absolute: 0 10*3/uL (ref 0.0–0.1)
EOS ABS: 0.3 10*3/uL (ref 0.0–0.7)
EOS PCT: 7 %
HCT: 28.1 % — ABNORMAL LOW (ref 36.0–46.0)
HEMOGLOBIN: 8.6 g/dL — AB (ref 12.0–15.0)
Lymphocytes Relative: 13 %
Lymphs Abs: 0.6 10*3/uL — ABNORMAL LOW (ref 0.7–4.0)
MCH: 29.6 pg (ref 26.0–34.0)
MCHC: 30.6 g/dL (ref 30.0–36.0)
MCV: 96.6 fL (ref 78.0–100.0)
MONOS PCT: 8 %
Monocytes Absolute: 0.4 10*3/uL (ref 0.1–1.0)
NEUTROS PCT: 72 %
Neutro Abs: 3.4 10*3/uL (ref 1.7–7.7)
Platelets: 186 10*3/uL (ref 150–400)
RBC: 2.91 MIL/uL — ABNORMAL LOW (ref 3.87–5.11)
RDW: 14.3 % (ref 11.5–15.5)
WBC: 4.8 10*3/uL (ref 4.0–10.5)

## 2016-11-16 LAB — SAMPLE TO BLOOD BANK

## 2016-11-17 ENCOUNTER — Other Ambulatory Visit (HOSPITAL_COMMUNITY): Payer: Self-pay | Admitting: *Deleted

## 2016-11-17 ENCOUNTER — Telehealth: Payer: Self-pay | Admitting: *Deleted

## 2016-11-17 NOTE — Telephone Encounter (Signed)
Called pt - talked to her husband, informed "blood count stable compared with last week, no transfusion this week "but will need iron infusion every week x 2 per Dr [name].  Appt scheduled for tomorrow at 0800 AM at [facility] - husband voiced understanding and will inform the pt. Understands to schedule the next one before she leaves tomorrow.

## 2016-11-17 NOTE — Telephone Encounter (Signed)
Thanks I put orders in

## 2016-11-17 NOTE — Progress Notes (Signed)
Internal Medicine Clinic Attending  Case discussed with Dr. Wallace soon after the resident saw the patient.  We reviewed the resident's history and exam and pertinent patient test results.  I agree with the assessment, diagnosis, and plan of care documented in the resident's note. 

## 2016-11-17 NOTE — Telephone Encounter (Signed)
-----   Message from [name], MD sent at 11/16/2016 12:46 PM EDT ----- Call pt: blood count stable compared with last week, no transfusion this week but see if we can get her on schedule for IV iron q wk X 2

## 2016-11-18 ENCOUNTER — Encounter (HOSPITAL_COMMUNITY): Payer: Medicare Other

## 2016-11-18 ENCOUNTER — Encounter (HOSPITAL_COMMUNITY)
Admission: RE | Admit: 2016-11-18 | Discharge: 2016-11-18 | Disposition: A | Discharge status: Home / Self Care | Payer: Medicare Other | Source: Ambulatory Visit | Attending: Oncology | Admitting: Oncology

## 2016-11-18 DIAGNOSIS — K31811 Angiodysplasia of stomach and duodenum with bleeding: Secondary | ICD-10-CM | POA: Insufficient documentation

## 2016-11-18 DIAGNOSIS — I780 Hereditary hemorrhagic telangiectasia: Secondary | ICD-10-CM | POA: Diagnosis present

## 2016-11-18 MED ORDER — FERUMOXYTOL INJECTION 510 MG/17 ML
510.0000 mg | INTRAVENOUS | Status: DC
  Administered 2016-11-18: 08:00:00 510 mg via INTRAVENOUS
  Filled 2016-11-18: qty 17

## 2016-11-18 MED ORDER — HEPARIN SOD (PORK) LOCK FLUSH 100 UNIT/ML IV SOLN
INTRAVENOUS | Status: AC
  Administered 2016-11-18: 500 [IU]
  Filled 2016-11-18: qty 5

## 2016-11-23 ENCOUNTER — Other Ambulatory Visit: Payer: Self-pay | Admitting: Oncology

## 2016-11-23 ENCOUNTER — Other Ambulatory Visit (INDEPENDENT_AMBULATORY_CARE_PROVIDER_SITE_OTHER): Payer: Medicare Other

## 2016-11-23 DIAGNOSIS — I780 Hereditary hemorrhagic telangiectasia: Secondary | ICD-10-CM

## 2016-11-23 DIAGNOSIS — K31819 Angiodysplasia of stomach and duodenum without bleeding: Secondary | ICD-10-CM

## 2016-11-23 DIAGNOSIS — K31811 Angiodysplasia of stomach and duodenum with bleeding: Secondary | ICD-10-CM

## 2016-11-23 LAB — CBC WITH DIFFERENTIAL/PLATELET
Basophils Absolute: 0 10*3/uL (ref 0.0–0.1)
Basophils Relative: 0 %
EOS ABS: 0.3 10*3/uL (ref 0.0–0.7)
Eosinophils Relative: 6 %
HCT: 26.2 % — ABNORMAL LOW (ref 36.0–46.0)
HEMOGLOBIN: 8 g/dL — AB (ref 12.0–15.0)
LYMPHS ABS: 0.6 10*3/uL — AB (ref 0.7–4.0)
LYMPHS PCT: 12 %
MCH: 30.7 pg (ref 26.0–34.0)
MCHC: 30.5 g/dL (ref 30.0–36.0)
MCV: 100.4 fL — AB (ref 78.0–100.0)
MONOS PCT: 7 %
Monocytes Absolute: 0.3 10*3/uL (ref 0.1–1.0)
Neutro Abs: 3.9 10*3/uL (ref 1.7–7.7)
Neutrophils Relative %: 75 %
Platelets: 166 10*3/uL (ref 150–400)
RBC: 2.61 MIL/uL — ABNORMAL LOW (ref 3.87–5.11)
RDW: 16.3 % — ABNORMAL HIGH (ref 11.5–15.5)
WBC: 5.2 10*3/uL (ref 4.0–10.5)

## 2016-11-23 LAB — SAMPLE TO BLOOD BANK

## 2016-11-23 LAB — PREPARE RBC (CROSSMATCH)

## 2016-11-23 NOTE — Addendum Note (Signed)
Addended by: Marcelino Duster on: 11/23/2016 02:11 PM   Modules accepted: Orders

## 2016-11-25 ENCOUNTER — Inpatient Hospital Stay (HOSPITAL_COMMUNITY): Admission: RE | Admit: 2016-11-25 | Payer: Medicare Other | Source: Ambulatory Visit

## 2016-11-25 ENCOUNTER — Encounter: Payer: Medicare Other | Admitting: Vascular Surgery

## 2016-11-26 ENCOUNTER — Other Ambulatory Visit: Payer: Self-pay | Admitting: Internal Medicine

## 2016-11-26 ENCOUNTER — Encounter (HOSPITAL_COMMUNITY)
Admission: RE | Admit: 2016-11-26 | Discharge: 2016-11-26 | Disposition: A | Discharge status: Home / Self Care | Payer: Medicare Other | Source: Ambulatory Visit | Attending: Oncology | Admitting: Oncology

## 2016-11-26 DIAGNOSIS — K31811 Angiodysplasia of stomach and duodenum with bleeding: Secondary | ICD-10-CM

## 2016-11-26 DIAGNOSIS — I780 Hereditary hemorrhagic telangiectasia: Secondary | ICD-10-CM | POA: Diagnosis not present

## 2016-11-26 MED ORDER — SODIUM CHLORIDE 0.9 % IV SOLN: Freq: Once | INTRAVENOUS | Status: DC

## 2016-11-26 MED ORDER — SODIUM CHLORIDE 0.9 % IV SOLN
510.0000 mg | INTRAVENOUS | Status: DC
  Administered 2016-11-26: 09:00:00 510 mg via INTRAVENOUS
  Filled 2016-11-26: qty 17

## 2016-11-26 MED ORDER — HEPARIN SOD (PORK) LOCK FLUSH 100 UNIT/ML IV SOLN
INTRAVENOUS | Status: AC
  Administered 2016-11-26: 500 [IU]
  Filled 2016-11-26: qty 5

## 2016-11-27 LAB — TYPE AND SCREEN
ABO/RH(D): A POS
ANTIBODY SCREEN: NEGATIVE
UNIT DIVISION: 0
Unit division: 0

## 2016-11-27 LAB — BPAM RBC
Blood Product Expiration Date: 201811062359
Blood Product Expiration Date: 201811062359
ISSUE DATE / TIME: 201810181122
ISSUE DATE / TIME: 201810180910
Unit Type and Rh: 6200
Unit Type and Rh: 6200

## 2016-11-30 ENCOUNTER — Other Ambulatory Visit (INDEPENDENT_AMBULATORY_CARE_PROVIDER_SITE_OTHER): Payer: Medicare Other

## 2016-11-30 DIAGNOSIS — I780 Hereditary hemorrhagic telangiectasia: Secondary | ICD-10-CM | POA: Diagnosis not present

## 2016-11-30 DIAGNOSIS — K31819 Angiodysplasia of stomach and duodenum without bleeding: Secondary | ICD-10-CM

## 2016-11-30 DIAGNOSIS — K31811 Angiodysplasia of stomach and duodenum with bleeding: Secondary | ICD-10-CM

## 2016-11-30 LAB — CBC WITH DIFFERENTIAL/PLATELET
Basophils Absolute: 0 10*3/uL (ref 0.0–0.1)
Basophils Relative: 1 %
EOS PCT: 5 %
Eosinophils Absolute: 0.3 10*3/uL (ref 0.0–0.7)
HEMATOCRIT: 26 % — AB (ref 36.0–46.0)
Hemoglobin: 8.3 g/dL — ABNORMAL LOW (ref 12.0–15.0)
LYMPHS ABS: 0.5 10*3/uL — AB (ref 0.7–4.0)
LYMPHS PCT: 11 %
MCH: 32 pg (ref 26.0–34.0)
MCHC: 31.9 g/dL (ref 30.0–36.0)
MCV: 100.4 fL — AB (ref 78.0–100.0)
MONO ABS: 0.4 10*3/uL (ref 0.1–1.0)
MONOS PCT: 8 %
NEUTROS ABS: 3.9 10*3/uL (ref 1.7–7.7)
Neutrophils Relative %: 75 %
PLATELETS: 141 10*3/uL — AB (ref 150–400)
RBC: 2.59 MIL/uL — ABNORMAL LOW (ref 3.87–5.11)
RDW: 16 % — AB (ref 11.5–15.5)
WBC: 5.1 10*3/uL (ref 4.0–10.5)

## 2016-11-30 LAB — SAMPLE TO BLOOD BANK

## 2016-12-07 ENCOUNTER — Other Ambulatory Visit (INDEPENDENT_AMBULATORY_CARE_PROVIDER_SITE_OTHER): Payer: Medicare Other

## 2016-12-07 ENCOUNTER — Telehealth: Payer: Self-pay | Admitting: *Deleted

## 2016-12-07 DIAGNOSIS — I780 Hereditary hemorrhagic telangiectasia: Secondary | ICD-10-CM | POA: Diagnosis not present

## 2016-12-07 DIAGNOSIS — K31811 Angiodysplasia of stomach and duodenum with bleeding: Secondary | ICD-10-CM

## 2016-12-07 DIAGNOSIS — K31819 Angiodysplasia of stomach and duodenum without bleeding: Secondary | ICD-10-CM

## 2016-12-07 LAB — CBC WITH DIFFERENTIAL/PLATELET
BASOS PCT: 0 %
Basophils Absolute: 0 10*3/uL (ref 0.0–0.1)
EOS ABS: 0.2 10*3/uL (ref 0.0–0.7)
Eosinophils Relative: 5 %
HCT: 23.8 % — ABNORMAL LOW (ref 36.0–46.0)
HEMOGLOBIN: 7.4 g/dL — AB (ref 12.0–15.0)
LYMPHS ABS: 0.5 10*3/uL — AB (ref 0.7–4.0)
Lymphocytes Relative: 11 %
MCH: 32.2 pg (ref 26.0–34.0)
MCHC: 31.1 g/dL (ref 30.0–36.0)
MCV: 103.5 fL — ABNORMAL HIGH (ref 78.0–100.0)
Monocytes Absolute: 0.3 10*3/uL (ref 0.1–1.0)
Monocytes Relative: 7 %
NEUTROS PCT: 77 %
Neutro Abs: 3.4 10*3/uL (ref 1.7–7.7)
Platelets: 203 10*3/uL (ref 150–400)
RBC: 2.3 MIL/uL — AB (ref 3.87–5.11)
RDW: 16.2 % — ABNORMAL HIGH (ref 11.5–15.5)
WBC: 4.5 10*3/uL (ref 4.0–10.5)

## 2016-12-07 LAB — SAMPLE TO BLOOD BANK

## 2016-12-07 NOTE — Telephone Encounter (Signed)
Spoke with attending and pcp---PCP will review chart tonight/tomorrow and place orders for transfusion if needed.Despina Hidden Cassady10/29/20184:28 PM

## 2016-12-07 NOTE — Telephone Encounter (Signed)
Unable to reach pcp at this time, will forward to attending pool for review and orders.Despina Hidden Cassady10/29/20182:32 PM

## 2016-12-07 NOTE — Telephone Encounter (Addendum)
Call made to short stay to schedule pt an appt in anticipation of blood transfusion.  Hgb 7.4 today down from 8.3 on 10/22.  Dr [name] is currently out of the office-will page pcp to see if transfusion is needed and to place orders.  First avail appt for transfusion is Wed 10/31 @ 8am here at [facility].[name]

## 2016-12-08 ENCOUNTER — Other Ambulatory Visit (HOSPITAL_COMMUNITY): Payer: Self-pay

## 2016-12-08 ENCOUNTER — Other Ambulatory Visit: Payer: Self-pay | Admitting: Internal Medicine

## 2016-12-08 DIAGNOSIS — D649 Anemia, unspecified: Secondary | ICD-10-CM

## 2016-12-08 LAB — PREPARE RBC (CROSSMATCH)

## 2016-12-08 NOTE — Addendum Note (Signed)
Addended by: Marcelino Duster on: 12/08/2016 09:15 AM   Modules accepted: Orders

## 2016-12-09 ENCOUNTER — Encounter (HOSPITAL_COMMUNITY)
Admission: RE | Admit: 2016-12-09 | Discharge: 2016-12-09 | Disposition: A | Discharge status: Home / Self Care | Payer: Medicare Other | Source: Ambulatory Visit | Attending: Oncology | Admitting: Oncology

## 2016-12-09 DIAGNOSIS — D649 Anemia, unspecified: Secondary | ICD-10-CM

## 2016-12-09 DIAGNOSIS — I780 Hereditary hemorrhagic telangiectasia: Secondary | ICD-10-CM | POA: Diagnosis not present

## 2016-12-09 MED ORDER — SODIUM CHLORIDE 0.9 % IV SOLN: Freq: Once | INTRAVENOUS | Status: DC

## 2016-12-09 MED ORDER — HEPARIN SOD (PORK) LOCK FLUSH 100 UNIT/ML IV SOLN
INTRAVENOUS | Status: AC
  Administered 2016-12-09: 14:00:00 500 [IU]
  Filled 2016-12-09: qty 5

## 2016-12-10 LAB — BPAM RBC
Blood Product Expiration Date: 201811052359
Blood Product Expiration Date: 201811052359
ISSUE DATE / TIME: 201810311119
ISSUE DATE / TIME: 201810310843
UNIT TYPE AND RH: 6200
Unit Type and Rh: 600

## 2016-12-10 LAB — TYPE AND SCREEN
ABO/RH(D): A POS
Antibody Screen: NEGATIVE
Unit division: 0
Unit division: 0

## 2016-12-15 ENCOUNTER — Ambulatory Visit: Payer: Medicare Other | Admitting: Dietician

## 2016-12-15 ENCOUNTER — Encounter: Payer: Self-pay | Admitting: Oncology

## 2016-12-15 ENCOUNTER — Encounter: Payer: Self-pay | Admitting: Dietician

## 2016-12-15 ENCOUNTER — Ambulatory Visit (INDEPENDENT_AMBULATORY_CARE_PROVIDER_SITE_OTHER): Payer: Medicare Other | Admitting: Oncology

## 2016-12-15 VITALS — BP 141/37 | HR 68 | Temp 98.3°F | Ht 63.0 in | Wt 159.6 lb

## 2016-12-15 DIAGNOSIS — I739 Peripheral vascular disease, unspecified: Secondary | ICD-10-CM | POA: Diagnosis not present

## 2016-12-15 DIAGNOSIS — I5030 Unspecified diastolic (congestive) heart failure: Secondary | ICD-10-CM

## 2016-12-15 DIAGNOSIS — L439 Lichen planus, unspecified: Secondary | ICD-10-CM | POA: Diagnosis not present

## 2016-12-15 DIAGNOSIS — K31819 Angiodysplasia of stomach and duodenum without bleeding: Secondary | ICD-10-CM

## 2016-12-15 DIAGNOSIS — R040 Epistaxis: Secondary | ICD-10-CM

## 2016-12-15 DIAGNOSIS — D500 Iron deficiency anemia secondary to blood loss (chronic): Secondary | ICD-10-CM | POA: Diagnosis not present

## 2016-12-15 DIAGNOSIS — E119 Type 2 diabetes mellitus without complications: Secondary | ICD-10-CM

## 2016-12-15 DIAGNOSIS — I110 Hypertensive heart disease with heart failure: Secondary | ICD-10-CM

## 2016-12-15 DIAGNOSIS — IMO0002 Reserved for concepts with insufficient information to code with codable children: Secondary | ICD-10-CM

## 2016-12-15 DIAGNOSIS — Z794 Long term (current) use of insulin: Secondary | ICD-10-CM

## 2016-12-15 DIAGNOSIS — E1129 Type 2 diabetes mellitus with other diabetic kidney complication: Secondary | ICD-10-CM

## 2016-12-15 DIAGNOSIS — E1165 Type 2 diabetes mellitus with hyperglycemia: Principal | ICD-10-CM

## 2016-12-15 DIAGNOSIS — I780 Hereditary hemorrhagic telangiectasia: Secondary | ICD-10-CM | POA: Diagnosis not present

## 2016-12-15 DIAGNOSIS — Z9889 Other specified postprocedural states: Secondary | ICD-10-CM

## 2016-12-15 DIAGNOSIS — R011 Cardiac murmur, unspecified: Secondary | ICD-10-CM | POA: Diagnosis not present

## 2016-12-15 DIAGNOSIS — Z886 Allergy status to analgesic agent status: Secondary | ICD-10-CM

## 2016-12-15 DIAGNOSIS — K31811 Angiodysplasia of stomach and duodenum with bleeding: Secondary | ICD-10-CM

## 2016-12-15 LAB — CBC WITH DIFFERENTIAL/PLATELET
BASOS ABS: 0 10*3/uL (ref 0.0–0.1)
Basophils Relative: 0 %
EOS ABS: 0.2 10*3/uL (ref 0.0–0.7)
EOS PCT: 4 %
HCT: 30 % — ABNORMAL LOW (ref 36.0–46.0)
Hemoglobin: 9.4 g/dL — ABNORMAL LOW (ref 12.0–15.0)
LYMPHS PCT: 13 %
Lymphs Abs: 0.6 10*3/uL — ABNORMAL LOW (ref 0.7–4.0)
MCH: 31.2 pg (ref 26.0–34.0)
MCHC: 31.3 g/dL (ref 30.0–36.0)
MCV: 99.7 fL (ref 78.0–100.0)
Monocytes Absolute: 0.3 10*3/uL (ref 0.1–1.0)
Monocytes Relative: 7 %
Neutro Abs: 3.4 10*3/uL (ref 1.7–7.7)
Neutrophils Relative %: 76 %
PLATELETS: 167 10*3/uL (ref 150–400)
RBC: 3.01 MIL/uL — AB (ref 3.87–5.11)
RDW: 16.9 % — ABNORMAL HIGH (ref 11.5–15.5)
WBC: 4.5 10*3/uL (ref 4.0–10.5)

## 2016-12-15 LAB — SAMPLE TO BLOOD BANK

## 2016-12-15 NOTE — Patient Instructions (Signed)
Continue weekly lab work Return visit with Dr Darnell Level in 6 months

## 2016-12-15 NOTE — Patient Instructions (Signed)
It appears that your blood sugars are well controlled in the mornings.  They are a bit elevated in the evening.  It may help to be sure to take your meal time Humalog especially at lunch.   Good to see you!  Butch Penny [phone]

## 2016-12-15 NOTE — Progress Notes (Signed)
Diabetes Self-Management Education  Visit Type: Follow-up  Appt. Start Time: 1015 Appt. End Time: 1030  12/15/2016  Ms. Deaver, identified by name and date of birth, is a 76 y.o. female with a diagnosis of Diabetes:  Marland Kitchen Type 2  ASSESSMENT  Weight 159 lb 9.6 oz (72.4 kg). her weight is increasing which could be indication of improved glucose control. Body mass index is 30.16 kg/m.  Diabetes Self-Management Education - 12/15/16 1100      Visit Information   Visit Type  Follow-up      Health Coping   How would you rate your overall health?  Good      Psychosocial Assessment   Patient Belief/Attitude about Diabetes  Motivated to manage diabetes    Self-care barriers  Debilitated state due to current medical condition    Self-management support  Doctor's office;Family;CDE visits    Patient Concerns  Glycemic Control    Special Needs  Instruct caregiver    Preferred Learning Style  Auditory    Learning Readiness  Ready unsure how much she remembers   unsure how much she remembers   How often do you need to have someone help you when you read instructions, pamphlets, or other written materials from your doctor or pharmacy?  3 - Sometimes      Complications   Last HgB A1C per patient/outside source  -- not using A1C to evaluate diabetes due to anemia   not using A1C to evaluate diabetes due to anemia   How often do you check your blood sugar?  1-2 times/day    Fasting Blood glucose range (mg/dL)  70-129;130-179    Postprandial Blood glucose range (mg/dL)  130-179;180-200;>200    Number of hypoglycemic episodes per month  -- 0   0   Number of hyperglycemic episodes per week  15    Can you tell when your blood sugar is high?  Yes    Have you had a dilated eye exam in the past 12 months?  Yes    Have you had a dental exam in the past 12 months?  Yes    Are you checking your feet?  Yes    How many days per week are you checking your feet?  7      Exercise   Exercise Type   ADL's      Patient Education   Medications  Other (comment) encouraged her to take all of her meal tim insulin injection   encouraged her to take all of her meal tim insulin injection   Monitoring  Identified appropriate SMBG and/or A1C goals. reveiwed meter download with patient   reveiwed meter download with patient     Individualized Goals (developed by patient)   Medications  take my medication as prescribed      Outcomes   Expected Outcomes  Demonstrated interest in learning. Expect positive outcomes    Future DMSE  4-6 wks    Program Status  Completed      Subsequent Visit   Since your last visit have you continued or begun to take your medications as prescribed?  Yes    Since your last visit have you had your blood pressure checked?  Yes    Is your most recent blood pressure lower, unchanged, or higher since your last visit?  Unchanged    Since your last visit have you experienced any weight changes?  No change    Since your last visit, are you checking your blood  glucose at least once a day?  Yes       Individualized Plan for Diabetes Self-Management Training:   Learning Objective:  Patient will have a greater understanding of diabetes self-management. Patient education plan is to attend individual and/or group sessions per assessed needs and concerns.   Plan:   There are no Patient Instructions on file for this visit.  Expected Outcomes:  Demonstrated interest in learning. Expect positive outcomes  Education material provided: avs If problems or questions, patient to contact team via:  Phone  Future DSME appointment: 4-6 wks  [place], Butch Penny, [place] 12/15/2016 11:50 AM.

## 2016-12-16 NOTE — Progress Notes (Signed)
Hematology and Oncology Follow Up Visit  [name] GERSTENBERGER [id number] 11/22/1940 76 y.o. 12/16/2016 4:23 PM   Principle Diagnosis: Encounter Diagnoses  Name Primary?  . 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 Gastric AVM   . Angiodysplasia of stomach with hemorrhage   . AVM (arteriovenous malformation) of stomach, acquired with hemorrhage Yes  Clinical summary reviewed and updated as needed: 76 year old lady with hereditary hemorrhagic telangiectasias with primary manifestation of her disease being chronic GI blood loss from gastric and, likely intestinal, angiodysplasia. She also has intermittent epistaxis. She requires frequent transfusions and parenteral iron supplementation. She does not tolerate oral iron. Transfusion requirements are becoming more frequent.  She last required hospital admission for urgent blood transfusion on March 02, 2016. The last time she required laser ablation was in August 2017.  Only a single nonbleeding lesion was fulgurated at that time. Previous  upper endoscopy on October 04, 2013 by Dr. Laurence Spates. A number of her gastric AVMs were actively bleeding. Some were cauterized. The largest was clipped. Despite laser fulguration and clipping of gastric AVMs, she continues to lose blood on a continuous basis.  She had an episode of hematemesis and was admitted on Jul 05, 2014. A number of AVMs and previous scars from laser procedures were seen but no active bleeding at time of upper endoscopy on May 27. She had a another procedure on September 05, 2014. A large AVM along the greater curvature of the stomach was bleeding and required laser therapy.  She continues to be transfusion dependent. I alternate parenteral iron with blood to try to keep her stable. It is an ongoing challenge.  Bleeding is very sporadic but she usually winds up having to have a blood transfusion every 2-3 weeks.   Interim History:  Overall stable.  Intermittent chronic  melena.  Intermittent low-grade epistaxis.  No change in transfusion requirements.  Still requiring blood every 2-3 weeks as well as intermittent parenteral iron.  She denied any gross hematochezia.  No hematuria.  She is under evaluation for persistent atypical leg pain now suggesting intermittent claudication with abnormal ABI screening done October 26, 2016.  Appointment scheduled with vascular surgery for next week.  She had a follow-up echocardiogram on August 15.  This showed mild LVH.  LVEF 60-65% with normal wall motion.  Grade 2 diastolic dysfunction.  Mildly thickened and calcified aortic valve.  No aortic stenosis.  Increased pulmonary artery pressure 57 mm. Medications: reviewed  Allergies:  Allergies  Allergen Reactions  . Aspirin Nausea And Vomiting    Review of Systems: See interim history Remaining ROS negative:   Physical Exam: Blood pressure (!) 141/37, pulse 68, temperature 98.3 F (36.8 C), temperature source Oral, height 5\' 1"  (1.549 m), weight 159 lb 9.6 oz (72.4 kg), SpO2 100 %. Wt Readings from Last 3 Encounters:  12/15/16 159 lb 9.6 oz (72.4 kg)  12/15/16 159 lb 9.6 oz (72.4 kg)  12/09/16 155 lb (70.3 kg)     General appearance: Well-nourished African-American woman HENNT: Pharynx no erythema, exudate, mass, or ulcer. No thyromegaly or thyroid nodules Lymph nodes: No cervical, supraclavicular, or axillary lymphadenopathy Breasts: Lungs: Clear to auscultation, resonant to percussion throughout Heart: Regular rhythm, II/VI aortic systolic murmur, no gallop, no rub, no click, no edema Abdomen: Soft, nontender, normal bowel sounds, no mass, no organomegaly Extremities: No edema, no calf tenderness Musculoskeletal: no joint deformities GU:  Vascular: Carotid pulses 2+, no bruits, distal pulses: Dorsalis pedis 1+ symmetric Neurologic: Alert,  oriented, PERRLA, optic discs sharp and vessels normal, no hemorrhage or exudate, cranial nerves grossly normal, motor  strength 5 over 5, reflexes 1+ symmetric, upper body coordination normal, gait normal, Skin: Chronic dermatitis lower extremities, no ecchymosis  Lab Results: CBC W/Diff    Component Value Date/Time   WBC 4.5 12/15/2016 1020   RBC 3.01 (L) 12/15/2016 1020   HGB 9.4 (L) 12/15/2016 1020   HGB 9.7 (L) 03/13/2015 1059   HCT 30.0 (L) 12/15/2016 1020   HCT 29.5 (L) 03/13/2015 1059   PLT 167 12/15/2016 1020   PLT 140 (L) 03/13/2015 1059   PLT 237 11/09/2014 1547   MCV 99.7 12/15/2016 1020   MCV 85.5 03/13/2015 1059   MCH 31.2 12/15/2016 1020   MCHC 31.3 12/15/2016 1020   RDW 16.9 (H) 12/15/2016 1020   RDW 21.7 (H) 03/13/2015 1059   LYMPHSABS 0.6 (L) 12/15/2016 1020   LYMPHSABS 0.4 (L) 03/13/2015 1059   MONOABS 0.3 12/15/2016 1020   MONOABS 0.3 03/13/2015 1059   EOSABS 0.2 12/15/2016 1020   EOSABS 0.3 03/13/2015 1059   BASOSABS 0.0 12/15/2016 1020   BASOSABS 0.0 03/13/2015 1059     Chemistry      Component Value Date/Time   NA 141 10/26/2016 1115   NA 136 09/17/2016 1603   NA 144 04/04/2014 1019   K 3.6 10/26/2016 1115   K 4.5 04/04/2014 1019   CL 107 10/26/2016 1115   CL 111 (H) 12/28/2011 0825   CO2 27 10/26/2016 1115   CO2 26 04/04/2014 1019   BUN 22 (H) 10/26/2016 1115   BUN 29 (H) 09/17/2016 1603   BUN 16.9 04/04/2014 1019   CREATININE 1.37 (H) 10/26/2016 1115   CREATININE 0.92 01/28/2015 0811   CREATININE 0.9 04/04/2014 1019      Component Value Date/Time   CALCIUM 9.1 10/26/2016 1115   CALCIUM 9.3 04/04/2014 1019   ALKPHOS 71 01/14/2016 1025   ALKPHOS 140 04/04/2014 1019   AST 21 01/14/2016 1025   AST 30 04/04/2014 1019   ALT 15 01/14/2016 1025   ALT 25 04/04/2014 1019   BILITOT 1.0 01/14/2016 1025   BILITOT 0.43 04/04/2014 1019       Radiological Studies: No results found.  Impression:  1.  HHT  2.  Ongoing GI bleeding and intermittent epistaxis secondary to #1  3.  Transfusion dependent secondary to #1 and 2.  4.  Iron deficiency anemia  secondary to #2  5.  Grade 2 diastolic cardiac dysfunction likely related to hypertension  6.  Intermittent claudication evaluation and progress  7.  Insulin-dependent diabetes  8.  Essential hypertension  9.  Benign aortic murmur-see echocardiogram result above  10.  Lichen planus chronic dermatitis lower extremities  CC: Patient Care Team: [name], DO as PCP - General (Internal Medicine)   Murriel Hopper, MD, [place]  Hematology-Oncology/Internal Medicine     11/7/20184:23 PM

## 2016-12-17 ENCOUNTER — Other Ambulatory Visit: Payer: Self-pay

## 2016-12-17 ENCOUNTER — Ambulatory Visit (INDEPENDENT_AMBULATORY_CARE_PROVIDER_SITE_OTHER): Payer: Medicare Other | Admitting: Internal Medicine

## 2016-12-17 ENCOUNTER — Encounter: Payer: Self-pay | Admitting: Internal Medicine

## 2016-12-17 DIAGNOSIS — E1129 Type 2 diabetes mellitus with other diabetic kidney complication: Secondary | ICD-10-CM

## 2016-12-17 DIAGNOSIS — E1151 Type 2 diabetes mellitus with diabetic peripheral angiopathy without gangrene: Secondary | ICD-10-CM | POA: Diagnosis not present

## 2016-12-17 DIAGNOSIS — Z794 Long term (current) use of insulin: Secondary | ICD-10-CM

## 2016-12-17 DIAGNOSIS — IMO0002 Reserved for concepts with insufficient information to code with codable children: Secondary | ICD-10-CM

## 2016-12-17 DIAGNOSIS — E785 Hyperlipidemia, unspecified: Secondary | ICD-10-CM

## 2016-12-17 DIAGNOSIS — I110 Hypertensive heart disease with heart failure: Secondary | ICD-10-CM

## 2016-12-17 DIAGNOSIS — I5032 Chronic diastolic (congestive) heart failure: Secondary | ICD-10-CM | POA: Diagnosis not present

## 2016-12-17 DIAGNOSIS — I739 Peripheral vascular disease, unspecified: Secondary | ICD-10-CM | POA: Diagnosis not present

## 2016-12-17 DIAGNOSIS — E1165 Type 2 diabetes mellitus with hyperglycemia: Secondary | ICD-10-CM | POA: Diagnosis not present

## 2016-12-17 DIAGNOSIS — Z0000 Encounter for general adult medical examination without abnormal findings: Secondary | ICD-10-CM

## 2016-12-17 LAB — GLUCOSE, CAPILLARY: GLUCOSE-CAPILLARY: 163 mg/dL — AB (ref 65–99)

## 2016-12-17 MED ORDER — INSULIN LISPRO 100 UNIT/ML (KWIKPEN): PEN_INJECTOR | SUBCUTANEOUS | 3 refills | Status: DC

## 2016-12-17 NOTE — Assessment & Plan Note (Signed)
Assessment: Peripheral vascular disease.  She unfortunately had to cancel her appt last month with [facility].  She has rescheduled this for next week.  She reports today that her symptoms are under good control and denies leg cramps or pain today.  Plan: - Further follow up with [facility] for PVD management.

## 2016-12-17 NOTE — Assessment & Plan Note (Signed)
Assessment/Plan: - patient due for DEXA that has previously been ordered but not yet completed.  We rescheduled this for her today. - declines flu and pneumonia vaccine - patient requesting assistance in home to help manage symptoms and medication.  Will order [facility] RN for assessment.

## 2016-12-17 NOTE — Progress Notes (Signed)
   CC: here for DM and PVD f/u  HPI:  [name] is a 76 y.o. woman with a past medical history listed below here today for follow up of her DM and PVD.  For details of today's visit and the status of her chronic medical issues please refer to the assessment and plan.   Past Medical History:  Diagnosis Date  . CHF (congestive heart failure) ([place])   . Chronic anemia   . Chronic diastolic CHF (congestive heart failure) ([place]) 10/03/2013  . Chronic GI bleeding    [name] 11/29/2014  . Family history of anesthesia complication    "niece has a hard time coming out" (09/15/2012)  . Frequent nosebleeds    chronic  . Gastric AV malformation    [name] 11/29/2014  . GERD (gastroesophageal reflux disease)   . Heart murmur   . History of blood transfusion "several"  . History of epistaxis   . HTN (hypertension), benign 03/02/2012  . Hyperlipidemia   . Iron deficiency anemia    chronic infusions"  . Lichen planus    Both lower extremities  . 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 Pneumonia 1990's X 2  . Seizures ([place]) 09/2014  . Telangiectasia    Gastric   . 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   Review of Systems:  Please see pertinent ROS reviewed in HPI and problem based charting.   Physical Exam:  Vitals:   12/17/16 1319  BP: (!) 146/47  Pulse: 68  Temp: 98.5 F (36.9 C)  TempSrc: Oral  SpO2: 100%  Weight: 159 lb 4.8 oz (72.3 kg)  Height: 5\' 1"  (1.549 m)   General: sitting in chair, NAD HEENT: NCAT, EOMI, no scleral icterus Cardiac: RRR, + systolic murmur Pulm: clear to auscultation bilaterally, moving normal volumes of air Ext: warm and well perfused, no edema Neuro: alert and oriented, no focal deficits  Assessment & Plan:   See Encounters Tab for problem based charting.  Patient discussed with Dr. Evette Doffing .  Peripheral vascular disease ([name] Assessment: Peripheral vascular disease.  She unfortunately had to cancel her  appt last month with [facility].  She has rescheduled this for next week.  She reports today that her symptoms are under good control and denies leg cramps or pain today.  Plan: - Further follow up with [facility] for PVD management.  DM (diabetes mellitus), type 2, uncontrolled, with renal complications ([name] Assessment: CBG this afternoon was 163.  She accurately states that she is taking 16 units of Lantus BID but is unsure of her Humalog dosing TID.  She states that she takes what is on her paper work.  Her glucometer shows most PM readings well above 250.  She does not endorse symptoms of polyuria, polydipsia, or blurred vision.  Plan: - Continues Lantus at 16 units BID - Continue Humalog 10 units at breakfast and lunch.  Increase Dinner dose to 16 units  Healthcare maintenance Assessment/Plan: - patient due for DEXA that has previously been ordered but not yet completed.  We rescheduled this for her today. - declines flu and pneumonia vaccine - patient requesting assistance in home to help manage symptoms and medication.  Will order [facility] RN for assessment.

## 2016-12-17 NOTE — Assessment & Plan Note (Signed)
Assessment: CBG this afternoon was 163.  She accurately states that she is taking 16 units of Lantus BID but is unsure of her Humalog dosing TID.  She states that she takes what is on her paper work.  Her glucometer shows most PM readings well above 250.  She does not endorse symptoms of polyuria, polydipsia, or blurred vision.  Plan: - Continues Lantus at 16 units BID - Continue Humalog 10 units at breakfast and lunch.  Increase Dinner dose to 16 units

## 2016-12-17 NOTE — Patient Instructions (Signed)
Thank you for coming to see me today. It was a pleasure. Today we talked about:   Blood sugars Take Lantus twice per day as same dose I increased your Humalog at dinner to 16 units.  Keep the same dose (10 units) at breakfast and lunch.  Please follow-up with me in 3 months  If you have any questions or concerns, please do not hesitate to call the office at [phone].  Take Care,   [name], DO

## 2016-12-18 ENCOUNTER — Other Ambulatory Visit: Payer: Self-pay | Admitting: Oncology

## 2016-12-18 DIAGNOSIS — I780 Hereditary hemorrhagic telangiectasia: Secondary | ICD-10-CM

## 2016-12-18 DIAGNOSIS — D500 Iron deficiency anemia secondary to blood loss (chronic): Secondary | ICD-10-CM

## 2016-12-18 DIAGNOSIS — K31811 Angiodysplasia of stomach and duodenum with bleeding: Secondary | ICD-10-CM

## 2016-12-18 NOTE — Progress Notes (Signed)
Internal Medicine Clinic Attending  Case discussed with Dr. Wallace at the time of the visit.  We reviewed the resident's history and exam and pertinent patient test results.  I agree with the assessment, diagnosis, and plan of care documented in the resident's note.  

## 2016-12-21 ENCOUNTER — Telehealth: Payer: Self-pay | Admitting: *Deleted

## 2016-12-21 ENCOUNTER — Other Ambulatory Visit (INDEPENDENT_AMBULATORY_CARE_PROVIDER_SITE_OTHER): Payer: Medicare Other

## 2016-12-21 ENCOUNTER — Telehealth: Payer: Self-pay

## 2016-12-21 DIAGNOSIS — I780 Hereditary hemorrhagic telangiectasia: Secondary | ICD-10-CM

## 2016-12-21 DIAGNOSIS — D500 Iron deficiency anemia secondary to blood loss (chronic): Secondary | ICD-10-CM

## 2016-12-21 DIAGNOSIS — K31819 Angiodysplasia of stomach and duodenum without bleeding: Secondary | ICD-10-CM

## 2016-12-21 DIAGNOSIS — K31811 Angiodysplasia of stomach and duodenum with bleeding: Secondary | ICD-10-CM

## 2016-12-21 LAB — CBC WITH DIFFERENTIAL/PLATELET
Basophils Absolute: 0 10*3/uL (ref 0.0–0.1)
Basophils Relative: 0 %
EOS PCT: 3 %
Eosinophils Absolute: 0.1 10*3/uL (ref 0.0–0.7)
HCT: 28.6 % — ABNORMAL LOW (ref 36.0–46.0)
Hemoglobin: 9.1 g/dL — ABNORMAL LOW (ref 12.0–15.0)
LYMPHS ABS: 0.7 10*3/uL (ref 0.7–4.0)
LYMPHS PCT: 13 %
MCH: 31.7 pg (ref 26.0–34.0)
MCHC: 31.8 g/dL (ref 30.0–36.0)
MCV: 99.7 fL (ref 78.0–100.0)
MONOS PCT: 7 %
Monocytes Absolute: 0.4 10*3/uL (ref 0.1–1.0)
Neutro Abs: 4.1 10*3/uL (ref 1.7–7.7)
Neutrophils Relative %: 77 %
PLATELETS: 201 10*3/uL (ref 150–400)
RBC: 2.87 MIL/uL — AB (ref 3.87–5.11)
RDW: 15.3 % (ref 11.5–15.5)
WBC: 5.3 10*3/uL (ref 4.0–10.5)

## 2016-12-21 LAB — SAMPLE TO BLOOD BANK

## 2016-12-21 LAB — FERRITIN: FERRITIN: 50 ng/mL (ref 11–307)

## 2016-12-21 NOTE — Telephone Encounter (Signed)
Sarah from [facility] was requesting an attending's name for this patient [facility] order.  Gave [facility] Autumn Patty information to process [place] [facility].

## 2016-12-21 NOTE — Telephone Encounter (Signed)
Pt called / informed "lab OK at 9.1 no transfusion this week" per Dr [name].

## 2016-12-21 NOTE — Telephone Encounter (Signed)
-----   Message from [name], MD sent at 12/21/2016 11:45 AM EST ----- Call pt: lab OK at 9.1 no transfusion this week

## 2016-12-21 NOTE — Telephone Encounter (Signed)
[name] with [facility] requesting to speak with a nurse. Please call pt back.

## 2016-12-21 NOTE — Addendum Note (Signed)
Addended by: Truddie Crumble on: 12/21/2016 10:46 AM   Modules accepted: Orders

## 2016-12-22 ENCOUNTER — Telehealth: Payer: Self-pay | Admitting: Pharmacist

## 2016-12-24 ENCOUNTER — Encounter: Payer: Self-pay | Admitting: Vascular Surgery

## 2016-12-24 ENCOUNTER — Ambulatory Visit: Payer: Medicare Other | Admitting: Vascular Surgery

## 2016-12-24 ENCOUNTER — Other Ambulatory Visit: Payer: Self-pay

## 2016-12-24 VITALS — BP 133/63 | HR 62 | Temp 98.5°F | Resp 14 | Ht 63.0 in | Wt 158.0 lb

## 2016-12-24 DIAGNOSIS — I739 Peripheral vascular disease, unspecified: Secondary | ICD-10-CM

## 2016-12-24 NOTE — Progress Notes (Signed)
Vitals:   12/24/16 1010  BP: (!) 152/54  Pulse: (!) 54  Resp: 14  Temp: 98.5 F (36.9 C)  TempSrc: Oral  SpO2: 99%  Weight: 158 lb (71.7 kg)  Height: 5\' 3"  (1.6 m)

## 2016-12-24 NOTE — Progress Notes (Signed)
Patient name: [name] MRN: [id number] DOB: 12-Aug-1940 Sex: female  REASON FOR CONSULT:    Bilateral leg pain.  The consult is requested by Dr. Jari Favre  HPI:   [name] is a pleasant 76 y.o. female, who apparently had an abnormal screening study which showed monophasic signals in the feet.  The patient was set up for vascular consultation.  The patient describes pain in both lower extremities which she has had for months.  The pain involves her thighs hips and calves bilaterally.  Her symptoms are brought on by ambulation but also occur with simply standing.  It does not sound like classic claudication.  There are no other aggravating or alleviating factor she denies any history of rest pain although she does get some cramps in her feet at night.  She denies any history of nonhealing ulcers.  She is unaware of any previous history of DVT.  Her risk factors for peripheral vascular disease include diabetes, hypertension, and hypercholesterolemia.  She denies any family history of premature cardiovascular disease and she is not a smoker.  Past Medical History:  Diagnosis Date  . CHF (congestive heart failure) ([place])   . Chronic anemia   . Chronic diastolic CHF (congestive heart failure) ([place]) 10/03/2013  . Chronic GI bleeding    [name] 11/29/2014  . Family history of anesthesia complication    "niece has a hard time coming out" (09/15/2012)  . Frequent nosebleeds    chronic  . Gastric AV malformation    [name] 11/29/2014  . GERD (gastroesophageal reflux disease)   . Heart murmur   . History of blood transfusion "several"  . History of epistaxis   . HTN (hypertension), benign 03/02/2012  . Hyperlipidemia   . Iron deficiency anemia    chronic infusions"  . Lichen planus    Both lower extremities  . 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 Pneumonia 1990's X 2  . Seizures ([place]) 09/2014  . Telangiectasia    Gastric   . 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    Family History  Problem Relation Age of Onset  . Stroke Father   . Breast cancer Unknown   . Malignant hyperthermia Neg Hx   . Seizures Neg Hx     SOCIAL HISTORY: Social History   Socioeconomic History  . Marital status: Married    Spouse name: [name]  . Number of children: 0  . Years of education: 9  . Highest education level: Not on file  Social Needs  . Financial resource strain: Not on file  . Food insecurity - worry: Not on file  . Food insecurity - inability: Not on file  . Transportation needs - medical: Not on file  . Transportation needs - non-medical: Not on file  Occupational History  . Occupation: Retired Systems developer: RETIRED  Tobacco Use  . Smoking status: Former Smoker    Packs/day: 1.00    Years: 20.00    Pack years: 20.00    Types: Cigarettes    Last attempt to quit: 02/10/1971    Years since quitting: 45.9  . Smokeless tobacco: Never Used  . Tobacco comment: 09/15/2012 "smoked 50-60 yr ago"  Substance and Sexual Activity  . Alcohol use: No    Alcohol/week: 0.0 oz  . Drug use: No  . Sexual activity: Not on file  Other Topics Concern  . Not on file  Social History Narrative  Married, lives with husband, [name].  Ambulates without assistance.     Caffeine use: none    Allergies  Allergen Reactions  . Aspirin Nausea And Vomiting    Current Outpatient Medications  Medication Sig Dispense Refill  . AMITIZA 24 MCG capsule Take 24 mcg by mouth 2 (two) times daily.    Marland Kitchen b complex vitamins capsule Take 1 capsule by mouth daily. 100 capsule 2  . Blood Glucose Monitoring Suppl (ONETOUCH VERIO) w/Device KIT     . calcium citrate-vitamin D (CITRACAL+D) 315-200 MG-UNIT tablet Take 1 tablet by mouth 2 (two) times daily. 100 tablet 0  . cetaphil (CETAPHIL) lotion Apply 1 application topically as needed for dry skin. 236 mL 0  . donepezil (ARICEPT) 10 MG tablet Take 1 tablet (10 mg total) by mouth at bedtime. 90 tablet 4  .  Elastic Bandages & Supports (MEDICAL COMPRESSION STOCKINGS) MISC Wear as much as possible while awake to reduce swelling 2 each 0  . FERROCITE 324 MG TABS tablet TAKE ONE TABLET BY MOUTH TWICE DAILY 180 tablet 3  . furosemide (LASIX) 20 MG tablet Take 1 tablet (20 mg total) by mouth daily. 30 tablet 5  . glucose blood (ONETOUCH VERIO) test strip Use as instructed 3-4 times daily. E11.29, insulin requiring 100 each 12  . Insulin Glargine (LANTUS) 100 UNIT/ML Solostar Pen Inject 16 Units into the skin 2 (two) times daily. 15 mL 2  . insulin lispro (HUMALOG) 100 UNIT/ML KiwkPen Take 10 units at breakfast and lunch.  Take 16 units at dinner. 15 mL 3  . Insulin Pen Needle 31G X 5 MM MISC Use to inject insulin 100 each 11  . lactulose (CHRONULAC) 10 GM/15ML solution Take 15 mLs (10 g total) by mouth 2 (two) times daily. 473 mL 2  . levETIRAcetam (KEPPRA) 500 MG tablet Take 1 tablet (500 mg total) by mouth 2 (two) times daily. 180 tablet 4  . memantine (NAMENDA) 10 MG tablet Take 1 tablet (10 mg total) by mouth 2 (two) times daily. 180 tablet 4  . ONETOUCH DELICA LANCETS FINE MISC Check blood sugar up to 3 times a day 100 each 12  . oxymetazoline (AFRIN) 0.05 % nasal spray Place 1 spray into both nostrils 2 (two) times daily as needed (Epistaxis). 30 mL 0  . pantoprazole (PROTONIX) 40 MG tablet Take 1 tablet (40 mg total) by mouth 2 (two) times daily. 60 tablet 1  . potassium chloride SA (KLOR-CON M20) 20 MEQ tablet TAKE 1 TABLET BY MOUTH ONCE DAILY 30 tablet 5  . pramipexole (MIRAPEX) 0.125 MG tablet TAKE ONE TABLET BY MOUTH ONCE DAILY 90 tablet 2  . sodium chloride (OCEAN) 0.65 % SOLN nasal spray Place 1 spray into both nostrils as needed for congestion. 1 Bottle 5   No current facility-administered medications for this visit.     REVIEW OF SYSTEMS:  '[X]'$  denotes positive finding, '[ ]'$  denotes negative finding Cardiac  Comments:  Chest pain or chest pressure:    Shortness of breath upon exertion:      Short of breath when lying flat:    Irregular heart rhythm:        Vascular    Pain in calf, thigh, or hip brought on by ambulation: X   Pain in feet at night that wakes you up from your sleep:     Blood clot in your veins:    Leg swelling:         Pulmonary    Oxygen  at home:    Productive cough:     Wheezing:         Neurologic    Sudden weakness in arms or legs:     Sudden numbness in arms or legs:     Sudden onset of difficulty speaking or slurred speech:    Temporary loss of vision in one eye:     Problems with dizziness:         Gastrointestinal    Blood in stool:     Vomited blood:         Genitourinary    Burning when urinating:     Blood in urine:        Psychiatric    Major depression:         Hematologic    Bleeding problems:    Problems with blood clotting too easily:        Skin    Rashes or ulcers:        Constitutional    Fever or chills:     PHYSICAL EXAM:   Vitals:   12/24/16 1010 12/24/16 1016  BP: (!) 152/54 133/63  Pulse: (!) 54 62  Resp: 14   Temp: 98.5 F (36.9 C)   TempSrc: Oral   SpO2: 99%   Weight: 158 lb (71.7 kg)   Height: '5\' 3"'$  (1.6 m)     GENERAL: The patient is a well-nourished female, in no acute distress. The vital signs are documented above. CARDIAC: There is a regular rate and rhythm.  She has a systolic ejection murmur. VASCULAR: I do not detect carotid bruits.  She has normal femoral pulses.  I cannot palpate popliteal or pedal pulses on either side. She has no significant lower extremity swelling. PULMONARY: There is good air exchange bilaterally without wheezing or rales. ABDOMEN: Soft and non-tender with normal pitched bowel sounds.  MUSCULOSKELETAL: There are no major deformities or cyanosis. NEUROLOGIC: No focal weakness or paresthesias are detected. SKIN: She has a rash on both legs from some old healed ulcers that occurred after a blood transfusion according to the patient. PSYCHIATRIC: The patient has a  normal affect.  DATA:    DOPPLER STUDY: I performed a Doppler study and found a biphasic posterior tibial signal on the right with a monophasic dorsalis pedis signal.  On the left side there is a monophasic but fairly brisk dorsalis pedis and posterior tibial signal.  MEDICAL ISSUES:   INFRAINGUINAL ARTERIAL OCCLUSIVE DISEASE: Based on her exam and Doppler study, she has evidence of infrainguinal arterial occlusive disease bilaterally.  However I do not think that all of her symptoms can be attributed to peripheral vascular disease given that she has symptoms even at rest.  She does not have any rest pain nor does she have any nonhealing ulcers.  Given her age and congestive heart failure certainly I would not recommend an aggressive approach to her peripheral vascular disease unless she developed critical limb ischemia.  If she developed rest pain or nonhealing ulcer we could consider arteriography.  I have encouraged her to stay as active as possible and walk as much is possible with her husband.  We have also discussed the importance of nutrition.  Fortunately she is not a smoker.  I be happy to see her back at any time if her symptoms progress.  Deitra Mayo [facility] [phone]

## 2016-12-28 ENCOUNTER — Other Ambulatory Visit (INDEPENDENT_AMBULATORY_CARE_PROVIDER_SITE_OTHER): Payer: Medicare Other

## 2016-12-28 ENCOUNTER — Telehealth: Payer: Self-pay | Admitting: *Deleted

## 2016-12-28 DIAGNOSIS — K31819 Angiodysplasia of stomach and duodenum without bleeding: Secondary | ICD-10-CM

## 2016-12-28 DIAGNOSIS — D500 Iron deficiency anemia secondary to blood loss (chronic): Secondary | ICD-10-CM

## 2016-12-28 DIAGNOSIS — I780 Hereditary hemorrhagic telangiectasia: Secondary | ICD-10-CM

## 2016-12-28 DIAGNOSIS — K31811 Angiodysplasia of stomach and duodenum with bleeding: Secondary | ICD-10-CM

## 2016-12-28 LAB — CBC WITH DIFFERENTIAL/PLATELET
BASOS ABS: 0 10*3/uL (ref 0.0–0.1)
BASOS PCT: 0 %
EOS ABS: 0.3 10*3/uL (ref 0.0–0.7)
EOS PCT: 6 %
HCT: 28.8 % — ABNORMAL LOW (ref 36.0–46.0)
Hemoglobin: 9.3 g/dL — ABNORMAL LOW (ref 12.0–15.0)
Lymphocytes Relative: 13 %
Lymphs Abs: 0.6 10*3/uL — ABNORMAL LOW (ref 0.7–4.0)
MCH: 32.5 pg (ref 26.0–34.0)
MCHC: 32.3 g/dL (ref 30.0–36.0)
MCV: 100.7 fL — ABNORMAL HIGH (ref 78.0–100.0)
MONO ABS: 0.4 10*3/uL (ref 0.1–1.0)
Monocytes Relative: 8 %
Neutro Abs: 3.4 10*3/uL (ref 1.7–7.7)
Neutrophils Relative %: 73 %
PLATELETS: 197 10*3/uL (ref 150–400)
RBC: 2.86 MIL/uL — ABNORMAL LOW (ref 3.87–5.11)
RDW: 15.6 % — AB (ref 11.5–15.5)
WBC: 4.7 10*3/uL (ref 4.0–10.5)

## 2016-12-28 LAB — SAMPLE TO BLOOD BANK

## 2016-12-28 NOTE — Telephone Encounter (Signed)
-----   Message from [name], MD sent at 12/28/2016  1:38 PM EST ----- Call pt: Happy Thanksgiving - does not need blood this week - count holding

## 2016-12-28 NOTE — Telephone Encounter (Signed)
Pt called / Informed "Happy Thanksgiving - does not need blood this week - count holding " per Dr [name]. Stated thank-you and Happy Thanksgiving to all.

## 2017-01-04 ENCOUNTER — Other Ambulatory Visit (INDEPENDENT_AMBULATORY_CARE_PROVIDER_SITE_OTHER): Payer: Medicare Other

## 2017-01-04 ENCOUNTER — Telehealth: Payer: Self-pay | Admitting: *Deleted

## 2017-01-04 DIAGNOSIS — K31811 Angiodysplasia of stomach and duodenum with bleeding: Secondary | ICD-10-CM

## 2017-01-04 DIAGNOSIS — I780 Hereditary hemorrhagic telangiectasia: Secondary | ICD-10-CM

## 2017-01-04 DIAGNOSIS — D500 Iron deficiency anemia secondary to blood loss (chronic): Secondary | ICD-10-CM

## 2017-01-04 DIAGNOSIS — K31819 Angiodysplasia of stomach and duodenum without bleeding: Secondary | ICD-10-CM

## 2017-01-04 LAB — CBC WITH DIFFERENTIAL/PLATELET
Basophils Absolute: 0 10*3/uL (ref 0.0–0.1)
Basophils Relative: 0 %
EOS ABS: 0.4 10*3/uL (ref 0.0–0.7)
EOS PCT: 7 %
HCT: 27.3 % — ABNORMAL LOW (ref 36.0–46.0)
Hemoglobin: 8.6 g/dL — ABNORMAL LOW (ref 12.0–15.0)
LYMPHS ABS: 0.7 10*3/uL (ref 0.7–4.0)
LYMPHS PCT: 14 %
MCH: 31.4 pg (ref 26.0–34.0)
MCHC: 31.5 g/dL (ref 30.0–36.0)
MCV: 99.6 fL (ref 78.0–100.0)
MONO ABS: 0.3 10*3/uL (ref 0.1–1.0)
MONOS PCT: 7 %
Neutro Abs: 3.4 10*3/uL (ref 1.7–7.7)
Neutrophils Relative %: 72 %
PLATELETS: 188 10*3/uL (ref 150–400)
RBC: 2.74 MIL/uL — AB (ref 3.87–5.11)
RDW: 15 % (ref 11.5–15.5)
WBC: 4.8 10*3/uL (ref 4.0–10.5)

## 2017-01-04 LAB — SAMPLE TO BLOOD BANK

## 2017-01-04 NOTE — Telephone Encounter (Signed)
Called pt - talked to her husband ; informed "blood count drifting down. No blood this week" per Dr [name]. Informed she might need transfusion next week depending on results on Monday. Stated he will let Mrs Ono know of results. Called [name], [facility] Short Stay - pt already has 2 appts on Tues. 11/4. Schedule appt on Wed 12/5 at 0800 AM for transfusion. I will notify pt after results are back on Monday (12/3).

## 2017-01-06 NOTE — Telephone Encounter (Signed)
Unable to reach.

## 2017-01-07 ENCOUNTER — Other Ambulatory Visit: Payer: Self-pay | Admitting: Oncology

## 2017-01-07 DIAGNOSIS — I780 Hereditary hemorrhagic telangiectasia: Secondary | ICD-10-CM

## 2017-01-07 DIAGNOSIS — K31819 Angiodysplasia of stomach and duodenum without bleeding: Secondary | ICD-10-CM

## 2017-01-07 DIAGNOSIS — D500 Iron deficiency anemia secondary to blood loss (chronic): Secondary | ICD-10-CM

## 2017-01-11 ENCOUNTER — Other Ambulatory Visit (INDEPENDENT_AMBULATORY_CARE_PROVIDER_SITE_OTHER): Payer: Medicare Other

## 2017-01-11 DIAGNOSIS — K31811 Angiodysplasia of stomach and duodenum with bleeding: Secondary | ICD-10-CM

## 2017-01-11 DIAGNOSIS — D500 Iron deficiency anemia secondary to blood loss (chronic): Secondary | ICD-10-CM

## 2017-01-11 DIAGNOSIS — I780 Hereditary hemorrhagic telangiectasia: Secondary | ICD-10-CM

## 2017-01-11 DIAGNOSIS — K31819 Angiodysplasia of stomach and duodenum without bleeding: Secondary | ICD-10-CM

## 2017-01-11 LAB — CBC WITH DIFFERENTIAL/PLATELET
BASOS ABS: 0 10*3/uL (ref 0.0–0.1)
BASOS PCT: 1 %
EOS ABS: 0.3 10*3/uL (ref 0.0–0.7)
Eosinophils Relative: 6 %
HCT: 26.3 % — ABNORMAL LOW (ref 36.0–46.0)
HEMOGLOBIN: 8.2 g/dL — AB (ref 12.0–15.0)
LYMPHS ABS: 0.5 10*3/uL — AB (ref 0.7–4.0)
Lymphocytes Relative: 12 %
MCH: 32 pg (ref 26.0–34.0)
MCHC: 31.2 g/dL (ref 30.0–36.0)
MCV: 102.7 fL — ABNORMAL HIGH (ref 78.0–100.0)
Monocytes Absolute: 0.3 10*3/uL (ref 0.1–1.0)
Monocytes Relative: 6 %
NEUTROS PCT: 75 %
Neutro Abs: 3.3 10*3/uL (ref 1.7–7.7)
PLATELETS: 199 10*3/uL (ref 150–400)
RBC: 2.56 MIL/uL — AB (ref 3.87–5.11)
RDW: 15.5 % (ref 11.5–15.5)
WBC: 4.4 10*3/uL (ref 4.0–10.5)

## 2017-01-11 LAB — SAMPLE TO BLOOD BANK

## 2017-01-11 NOTE — Addendum Note (Signed)
Addended by: Marcelino Duster on: 01/11/2017 04:39 PM   Modules accepted: Orders

## 2017-01-12 ENCOUNTER — Ambulatory Visit
Admission: RE | Admit: 2017-01-12 | Discharge: 2017-01-12 | Disposition: A | Discharge status: Home / Self Care | Payer: Medicare Other | Source: Ambulatory Visit | Attending: Internal Medicine | Admitting: Internal Medicine

## 2017-01-12 DIAGNOSIS — E2839 Other primary ovarian failure: Secondary | ICD-10-CM

## 2017-01-12 DIAGNOSIS — R921 Mammographic calcification found on diagnostic imaging of breast: Secondary | ICD-10-CM

## 2017-01-12 LAB — PREPARE RBC (CROSSMATCH)

## 2017-01-12 NOTE — Telephone Encounter (Signed)
Labs reviewed with DrWallace on 12/03.  Pt will need transfusion and already has appt scheduled for 12/05  @0800  in [place] Stay.  Spoke with pt's husband and made him aware.  Regenia Skeeter, Rithwik Schmieg Cassady12/4/201811:47 AM

## 2017-01-13 ENCOUNTER — Encounter: Payer: Self-pay | Admitting: Internal Medicine

## 2017-01-13 ENCOUNTER — Encounter (HOSPITAL_COMMUNITY)
Admission: RE | Admit: 2017-01-13 | Discharge: 2017-01-13 | Disposition: A | Discharge status: Home / Self Care | Payer: Medicare Other | Source: Ambulatory Visit | Attending: Oncology | Admitting: Oncology

## 2017-01-13 DIAGNOSIS — Q2733 Arteriovenous malformation of digestive system vessel: Secondary | ICD-10-CM | POA: Insufficient documentation

## 2017-01-13 DIAGNOSIS — D500 Iron deficiency anemia secondary to blood loss (chronic): Secondary | ICD-10-CM | POA: Diagnosis present

## 2017-01-13 DIAGNOSIS — K31819 Angiodysplasia of stomach and duodenum without bleeding: Secondary | ICD-10-CM

## 2017-01-13 DIAGNOSIS — I780 Hereditary hemorrhagic telangiectasia: Secondary | ICD-10-CM | POA: Diagnosis present

## 2017-01-13 DIAGNOSIS — M8580 Other specified disorders of bone density and structure, unspecified site: Secondary | ICD-10-CM | POA: Insufficient documentation

## 2017-01-13 DIAGNOSIS — M810 Age-related osteoporosis without current pathological fracture: Secondary | ICD-10-CM

## 2017-01-13 DIAGNOSIS — Z780 Asymptomatic menopausal state: Secondary | ICD-10-CM | POA: Insufficient documentation

## 2017-01-13 MED ORDER — HEPARIN SOD (PORK) LOCK FLUSH 100 UNIT/ML IV SOLN
INTRAVENOUS | Status: AC
  Administered 2017-01-13: 14:00:00 500 [IU] via INTRAVENOUS
  Filled 2017-01-13: qty 5

## 2017-01-13 MED ORDER — SODIUM CHLORIDE 0.9 % IV SOLN: Freq: Once | INTRAVENOUS | Status: DC

## 2017-01-13 MED ORDER — HEPARIN SOD (PORK) LOCK FLUSH 100 UNIT/ML IV SOLN: 500.0000 [IU] | Freq: Once | INTRAVENOUS | Status: DC

## 2017-01-14 LAB — TYPE AND SCREEN
ABO/RH(D): A POS
Antibody Screen: NEGATIVE
UNIT DIVISION: 0
UNIT DIVISION: 0

## 2017-01-14 LAB — BPAM RBC
BLOOD PRODUCT EXPIRATION DATE: 201812252359
Blood Product Expiration Date: 201812262359
ISSUE DATE / TIME: 201812050827
ISSUE DATE / TIME: 201812051047
UNIT TYPE AND RH: 6200
Unit Type and Rh: 6200

## 2017-01-15 ENCOUNTER — Encounter: Payer: Self-pay | Admitting: *Deleted

## 2017-01-15 NOTE — Progress Notes (Signed)
01-15-17  1630  Called Ms Tienda to reschedule lab visit for 01-18-17 due to clinic closing/weather  Ms Hildebrant stated she will reschedule for Tuesday 01-19-17 at 1p  Maryan Rued, PBT 01-15-17  [phone]

## 2017-01-18 ENCOUNTER — Other Ambulatory Visit: Payer: Medicare Other

## 2017-01-21 ENCOUNTER — Ambulatory Visit: Payer: Medicare Other | Admitting: Cardiology

## 2017-01-21 ENCOUNTER — Other Ambulatory Visit (INDEPENDENT_AMBULATORY_CARE_PROVIDER_SITE_OTHER): Payer: Medicare Other

## 2017-01-21 ENCOUNTER — Telehealth: Payer: Self-pay | Admitting: *Deleted

## 2017-01-21 DIAGNOSIS — I780 Hereditary hemorrhagic telangiectasia: Secondary | ICD-10-CM

## 2017-01-21 DIAGNOSIS — D500 Iron deficiency anemia secondary to blood loss (chronic): Secondary | ICD-10-CM

## 2017-01-21 DIAGNOSIS — K31811 Angiodysplasia of stomach and duodenum with bleeding: Secondary | ICD-10-CM

## 2017-01-21 DIAGNOSIS — K31819 Angiodysplasia of stomach and duodenum without bleeding: Secondary | ICD-10-CM

## 2017-01-21 LAB — SAMPLE TO BLOOD BANK

## 2017-01-21 LAB — CBC WITH DIFFERENTIAL/PLATELET
BASOS PCT: 0 %
Basophils Absolute: 0 10*3/uL (ref 0.0–0.1)
Eosinophils Absolute: 0.4 10*3/uL (ref 0.0–0.7)
Eosinophils Relative: 8 %
HEMATOCRIT: 31.4 % — AB (ref 36.0–46.0)
Hemoglobin: 9.9 g/dL — ABNORMAL LOW (ref 12.0–15.0)
LYMPHS ABS: 0.7 10*3/uL (ref 0.7–4.0)
LYMPHS PCT: 15 %
MCH: 31.1 pg (ref 26.0–34.0)
MCHC: 31.5 g/dL (ref 30.0–36.0)
MCV: 98.7 fL (ref 78.0–100.0)
MONO ABS: 0.4 10*3/uL (ref 0.1–1.0)
MONOS PCT: 7 %
NEUTROS ABS: 3.6 10*3/uL (ref 1.7–7.7)
Neutrophils Relative %: 70 %
Platelets: 165 10*3/uL (ref 150–400)
RBC: 3.18 MIL/uL — ABNORMAL LOW (ref 3.87–5.11)
RDW: 14.9 % (ref 11.5–15.5)
WBC: 5.1 10*3/uL (ref 4.0–10.5)

## 2017-01-21 LAB — FERRITIN: Ferritin: 69 ng/mL (ref 11–307)

## 2017-01-21 NOTE — Telephone Encounter (Signed)
Pt called / informed "blood count good (@9 .9). No transfusion this week" per Dr [name]. Informed cancel lab appt on Monday ; instead appt scheduled Wed the 19th @1000  AM - pt repeated new date/voiced understanding.

## 2017-01-21 NOTE — Telephone Encounter (Signed)
-----   Message from [name], MD sent at 01/21/2017 11:23 AM EST ----- Call pt: blood count good. No transfusion this week

## 2017-01-22 ENCOUNTER — Encounter: Payer: Self-pay | Admitting: Cardiology

## 2017-01-25 ENCOUNTER — Other Ambulatory Visit: Payer: Medicare Other

## 2017-01-26 ENCOUNTER — Other Ambulatory Visit: Payer: Self-pay | Admitting: Oncology

## 2017-01-26 DIAGNOSIS — D500 Iron deficiency anemia secondary to blood loss (chronic): Secondary | ICD-10-CM

## 2017-01-26 DIAGNOSIS — K31819 Angiodysplasia of stomach and duodenum without bleeding: Secondary | ICD-10-CM

## 2017-01-27 ENCOUNTER — Other Ambulatory Visit (INDEPENDENT_AMBULATORY_CARE_PROVIDER_SITE_OTHER): Payer: Medicare Other

## 2017-01-27 ENCOUNTER — Telehealth: Payer: Self-pay | Admitting: *Deleted

## 2017-01-27 DIAGNOSIS — I780 Hereditary hemorrhagic telangiectasia: Secondary | ICD-10-CM

## 2017-01-27 DIAGNOSIS — D500 Iron deficiency anemia secondary to blood loss (chronic): Secondary | ICD-10-CM

## 2017-01-27 DIAGNOSIS — K31819 Angiodysplasia of stomach and duodenum without bleeding: Secondary | ICD-10-CM

## 2017-01-27 DIAGNOSIS — Q2733 Arteriovenous malformation of digestive system vessel: Secondary | ICD-10-CM

## 2017-01-27 DIAGNOSIS — K31811 Angiodysplasia of stomach and duodenum with bleeding: Secondary | ICD-10-CM

## 2017-01-27 LAB — CBC WITH DIFFERENTIAL/PLATELET
BASOS ABS: 0 10*3/uL (ref 0.0–0.1)
Basophils Relative: 0 %
Eosinophils Absolute: 0.1 10*3/uL (ref 0.0–0.7)
Eosinophils Relative: 1 %
HEMATOCRIT: 25.2 % — AB (ref 36.0–46.0)
HEMOGLOBIN: 8 g/dL — AB (ref 12.0–15.0)
LYMPHS PCT: 7 %
Lymphs Abs: 0.6 10*3/uL — ABNORMAL LOW (ref 0.7–4.0)
MCH: 32 pg (ref 26.0–34.0)
MCHC: 31.7 g/dL (ref 30.0–36.0)
MCV: 100.8 fL — AB (ref 78.0–100.0)
MONO ABS: 0.7 10*3/uL (ref 0.1–1.0)
MONOS PCT: 8 %
NEUTROS ABS: 7.4 10*3/uL (ref 1.7–7.7)
NEUTROS PCT: 84 %
Platelets: 212 10*3/uL (ref 150–400)
RBC: 2.5 MIL/uL — ABNORMAL LOW (ref 3.87–5.11)
RDW: 15.4 % (ref 11.5–15.5)
WBC: 8.8 10*3/uL (ref 4.0–10.5)

## 2017-01-27 LAB — SAMPLE TO BLOOD BANK

## 2017-01-27 NOTE — Telephone Encounter (Signed)
Hgb down to 8.0. I have reserved a bed for Friday at [facility] Short Stay if pt needs blood transfusion. Dr [name] is on vacation; DR Juleen China informed .

## 2017-01-27 NOTE — Addendum Note (Signed)
Addended by: Hulan Fray on: 01/27/2017 07:57 AM   Modules accepted: Orders

## 2017-01-28 ENCOUNTER — Other Ambulatory Visit: Payer: Self-pay | Admitting: Internal Medicine

## 2017-01-28 DIAGNOSIS — K31819 Angiodysplasia of stomach and duodenum without bleeding: Secondary | ICD-10-CM

## 2017-01-28 LAB — PREPARE RBC (CROSSMATCH)

## 2017-01-28 NOTE — Telephone Encounter (Signed)
Called pt -talked to [name]; informed pt's hgb down to 8.0 and will transfusion; scheduled tomorrow @ 0800 AM here at [place]. And f/u next Thursday for labs. Voiced understanding.

## 2017-01-28 NOTE — Addendum Note (Signed)
Addended by: Ebbie Latus on: 01/28/2017 09:02 AM   Modules accepted: Orders

## 2017-01-28 NOTE — Telephone Encounter (Signed)
Transfusion orders placed for [name] to receive on Friday at [place].

## 2017-01-29 ENCOUNTER — Ambulatory Visit (HOSPITAL_COMMUNITY)
Admission: RE | Admit: 2017-01-29 | Discharge: 2017-01-29 | Disposition: A | Discharge status: Home / Self Care | Payer: Medicare Other | Source: Ambulatory Visit | Attending: Oncology | Admitting: Oncology

## 2017-01-29 DIAGNOSIS — K922 Gastrointestinal hemorrhage, unspecified: Secondary | ICD-10-CM | POA: Insufficient documentation

## 2017-01-29 DIAGNOSIS — K31819 Angiodysplasia of stomach and duodenum without bleeding: Secondary | ICD-10-CM

## 2017-01-29 MED ORDER — HEPARIN SOD (PORK) LOCK FLUSH 100 UNIT/ML IV SOLN
INTRAVENOUS | Status: AC
  Administered 2017-01-29: 14:00:00 500 [IU]
  Filled 2017-01-29: qty 5

## 2017-01-29 MED ORDER — SODIUM CHLORIDE 0.9 % IV SOLN
Freq: Once | INTRAVENOUS | Status: AC
  Administered 2017-01-29: 09:00:00 via INTRAVENOUS

## 2017-01-29 NOTE — Telephone Encounter (Signed)
A user error has taken place: encounter opened in error, closed for administrative reasons.

## 2017-01-30 LAB — BPAM RBC
BLOOD PRODUCT EXPIRATION DATE: 201812272359
Blood Product Expiration Date: 201812272359
ISSUE DATE / TIME: 201812210834
ISSUE DATE / TIME: 201812211121
Unit Type and Rh: 6200
Unit Type and Rh: 6200

## 2017-01-30 LAB — TYPE AND SCREEN
ABO/RH(D): A POS
ANTIBODY SCREEN: NEGATIVE
Unit division: 0
Unit division: 0

## 2017-02-04 ENCOUNTER — Telehealth: Payer: Self-pay | Admitting: *Deleted

## 2017-02-04 ENCOUNTER — Other Ambulatory Visit (INDEPENDENT_AMBULATORY_CARE_PROVIDER_SITE_OTHER): Payer: Medicare Other

## 2017-02-04 DIAGNOSIS — K5520 Angiodysplasia of colon without hemorrhage: Secondary | ICD-10-CM | POA: Diagnosis not present

## 2017-02-04 DIAGNOSIS — D500 Iron deficiency anemia secondary to blood loss (chronic): Secondary | ICD-10-CM

## 2017-02-04 DIAGNOSIS — K31811 Angiodysplasia of stomach and duodenum with bleeding: Secondary | ICD-10-CM

## 2017-02-04 DIAGNOSIS — K31819 Angiodysplasia of stomach and duodenum without bleeding: Secondary | ICD-10-CM

## 2017-02-04 DIAGNOSIS — I780 Hereditary hemorrhagic telangiectasia: Secondary | ICD-10-CM | POA: Diagnosis not present

## 2017-02-04 LAB — CBC WITH DIFFERENTIAL/PLATELET
BASOS ABS: 0 10*3/uL (ref 0.0–0.1)
Basophils Relative: 0 %
EOS PCT: 4 %
Eosinophils Absolute: 0.2 10*3/uL (ref 0.0–0.7)
HCT: 27.7 % — ABNORMAL LOW (ref 36.0–46.0)
HEMOGLOBIN: 8.7 g/dL — AB (ref 12.0–15.0)
LYMPHS ABS: 0.5 10*3/uL — AB (ref 0.7–4.0)
LYMPHS PCT: 12 %
MCH: 30.9 pg (ref 26.0–34.0)
MCHC: 31.4 g/dL (ref 30.0–36.0)
MCV: 98.2 fL (ref 78.0–100.0)
Monocytes Absolute: 0.3 10*3/uL (ref 0.1–1.0)
Monocytes Relative: 6 %
NEUTROS ABS: 3.6 10*3/uL (ref 1.7–7.7)
NEUTROS PCT: 78 %
PLATELETS: 163 10*3/uL (ref 150–400)
RBC: 2.82 MIL/uL — AB (ref 3.87–5.11)
RDW: 18.2 % — ABNORMAL HIGH (ref 11.5–15.5)
WBC: 4.6 10*3/uL (ref 4.0–10.5)

## 2017-02-04 LAB — SAMPLE TO BLOOD BANK

## 2017-02-04 NOTE — Telephone Encounter (Signed)
Called pt - talked to Mr Allnutt - informed "no blood this week. Repeat CBC next Wed 02/11/16 " per Dr [name]. Voiced understanding.

## 2017-02-04 NOTE — Telephone Encounter (Signed)
-----   Message from [name], MD sent at 02/04/2017  3:13 PM EST ----- Call pt: no blood this week. Repeat CBC next Wed 02/11/16

## 2017-02-08 ENCOUNTER — Other Ambulatory Visit: Payer: Medicare Other

## 2017-02-10 ENCOUNTER — Other Ambulatory Visit (INDEPENDENT_AMBULATORY_CARE_PROVIDER_SITE_OTHER): Payer: Medicare Other

## 2017-02-10 ENCOUNTER — Telehealth: Payer: Self-pay | Admitting: *Deleted

## 2017-02-10 DIAGNOSIS — I780 Hereditary hemorrhagic telangiectasia: Secondary | ICD-10-CM

## 2017-02-10 DIAGNOSIS — D500 Iron deficiency anemia secondary to blood loss (chronic): Secondary | ICD-10-CM

## 2017-02-10 DIAGNOSIS — K5521 Angiodysplasia of colon with hemorrhage: Secondary | ICD-10-CM

## 2017-02-10 DIAGNOSIS — K31811 Angiodysplasia of stomach and duodenum with bleeding: Secondary | ICD-10-CM

## 2017-02-10 DIAGNOSIS — K31819 Angiodysplasia of stomach and duodenum without bleeding: Secondary | ICD-10-CM

## 2017-02-10 LAB — CBC WITH DIFFERENTIAL/PLATELET
BASOS ABS: 0 10*3/uL (ref 0.0–0.1)
Basophils Relative: 0 %
EOS ABS: 0.3 10*3/uL (ref 0.0–0.7)
EOS PCT: 7 %
HCT: 29.5 % — ABNORMAL LOW (ref 36.0–46.0)
Hemoglobin: 8.9 g/dL — ABNORMAL LOW (ref 12.0–15.0)
Lymphocytes Relative: 13 %
Lymphs Abs: 0.6 10*3/uL — ABNORMAL LOW (ref 0.7–4.0)
MCH: 30.4 pg (ref 26.0–34.0)
MCHC: 30.2 g/dL (ref 30.0–36.0)
MCV: 100.7 fL — ABNORMAL HIGH (ref 78.0–100.0)
MONO ABS: 0.5 10*3/uL (ref 0.1–1.0)
Monocytes Relative: 11 %
Neutro Abs: 2.9 10*3/uL (ref 1.7–7.7)
Neutrophils Relative %: 69 %
PLATELETS: 190 10*3/uL (ref 150–400)
RBC: 2.93 MIL/uL — ABNORMAL LOW (ref 3.87–5.11)
RDW: 17.5 % — AB (ref 11.5–15.5)
WBC: 4.3 10*3/uL (ref 4.0–10.5)

## 2017-02-10 LAB — SAMPLE TO BLOOD BANK

## 2017-02-10 NOTE — Telephone Encounter (Signed)
-----   Message from [name], MD sent at 02/10/2017  3:07 PM EST ----- Call pt: Hb 8.9, does not need blood this week. Let's get her back on Monday schedule for lab checks

## 2017-02-10 NOTE — Telephone Encounter (Signed)
Called pt - talked to Mr Wierzbicki; informed "Hb 8.9, does not need blood this week." per Dr [name]. And informed we will see pt on Monday; already scheduled; voiced understanding.

## 2017-02-15 ENCOUNTER — Telehealth: Payer: Self-pay | Admitting: *Deleted

## 2017-02-15 ENCOUNTER — Other Ambulatory Visit (INDEPENDENT_AMBULATORY_CARE_PROVIDER_SITE_OTHER): Payer: Medicare Other

## 2017-02-15 DIAGNOSIS — K5521 Angiodysplasia of colon with hemorrhage: Secondary | ICD-10-CM

## 2017-02-15 DIAGNOSIS — K31819 Angiodysplasia of stomach and duodenum without bleeding: Secondary | ICD-10-CM

## 2017-02-15 DIAGNOSIS — I780 Hereditary hemorrhagic telangiectasia: Secondary | ICD-10-CM

## 2017-02-15 DIAGNOSIS — D500 Iron deficiency anemia secondary to blood loss (chronic): Secondary | ICD-10-CM

## 2017-02-15 DIAGNOSIS — K31811 Angiodysplasia of stomach and duodenum with bleeding: Secondary | ICD-10-CM

## 2017-02-15 LAB — CBC WITH DIFFERENTIAL/PLATELET
Basophils Absolute: 0 10*3/uL (ref 0.0–0.1)
Basophils Relative: 0 %
Eosinophils Absolute: 0.2 10*3/uL (ref 0.0–0.7)
Eosinophils Relative: 3 %
HEMATOCRIT: 31.3 % — AB (ref 36.0–46.0)
HEMOGLOBIN: 9.2 g/dL — AB (ref 12.0–15.0)
LYMPHS PCT: 11 %
Lymphs Abs: 0.6 10*3/uL — ABNORMAL LOW (ref 0.7–4.0)
MCH: 29.9 pg (ref 26.0–34.0)
MCHC: 29.4 g/dL — ABNORMAL LOW (ref 30.0–36.0)
MCV: 101.6 fL — AB (ref 78.0–100.0)
MONO ABS: 0.4 10*3/uL (ref 0.1–1.0)
Monocytes Relative: 8 %
NEUTROS ABS: 4.4 10*3/uL (ref 1.7–7.7)
NEUTROS PCT: 78 %
Platelets: 203 10*3/uL (ref 150–400)
RBC: 3.08 MIL/uL — ABNORMAL LOW (ref 3.87–5.11)
RDW: 16.6 % — AB (ref 11.5–15.5)
WBC: 5.7 10*3/uL (ref 4.0–10.5)

## 2017-02-15 LAB — SAMPLE TO BLOOD BANK

## 2017-02-15 NOTE — Telephone Encounter (Signed)
Called pt - talked to [name]; informed "count stable (@9 .2); no blood this week " per Dr [name]. "thanks for calling" and he will let pt know of her result.

## 2017-02-15 NOTE — Telephone Encounter (Signed)
-----   Message from [name], MD sent at 02/15/2017  1:50 PM EST ----- Call pt: count stable; no blood this week

## 2017-02-22 ENCOUNTER — Other Ambulatory Visit: Payer: Medicare Other

## 2017-02-23 ENCOUNTER — Other Ambulatory Visit (INDEPENDENT_AMBULATORY_CARE_PROVIDER_SITE_OTHER): Payer: Medicare Other

## 2017-02-23 ENCOUNTER — Telehealth: Payer: Self-pay | Admitting: *Deleted

## 2017-02-23 ENCOUNTER — Other Ambulatory Visit: Payer: Self-pay | Admitting: Oncology

## 2017-02-23 DIAGNOSIS — K5521 Angiodysplasia of colon with hemorrhage: Secondary | ICD-10-CM

## 2017-02-23 DIAGNOSIS — I780 Hereditary hemorrhagic telangiectasia: Secondary | ICD-10-CM | POA: Diagnosis not present

## 2017-02-23 DIAGNOSIS — K31819 Angiodysplasia of stomach and duodenum without bleeding: Secondary | ICD-10-CM

## 2017-02-23 DIAGNOSIS — K31811 Angiodysplasia of stomach and duodenum with bleeding: Secondary | ICD-10-CM

## 2017-02-23 DIAGNOSIS — D500 Iron deficiency anemia secondary to blood loss (chronic): Secondary | ICD-10-CM | POA: Diagnosis not present

## 2017-02-23 LAB — CBC WITH DIFFERENTIAL/PLATELET
Basophils Absolute: 0 10*3/uL (ref 0.0–0.1)
Basophils Relative: 0 %
Eosinophils Absolute: 0.3 10*3/uL (ref 0.0–0.7)
Eosinophils Relative: 6 %
HEMATOCRIT: 25.9 % — AB (ref 36.0–46.0)
Hemoglobin: 8 g/dL — ABNORMAL LOW (ref 12.0–15.0)
LYMPHS PCT: 14 %
Lymphs Abs: 0.7 10*3/uL (ref 0.7–4.0)
MCH: 31.7 pg (ref 26.0–34.0)
MCHC: 30.9 g/dL (ref 30.0–36.0)
MCV: 102.8 fL — AB (ref 78.0–100.0)
MONO ABS: 0.4 10*3/uL (ref 0.1–1.0)
MONOS PCT: 8 %
NEUTROS ABS: 3.8 10*3/uL (ref 1.7–7.7)
Neutrophils Relative %: 72 %
Platelets: 183 10*3/uL (ref 150–400)
RBC: 2.52 MIL/uL — ABNORMAL LOW (ref 3.87–5.11)
RDW: 16.2 % — AB (ref 11.5–15.5)
WBC: 5.2 10*3/uL (ref 4.0–10.5)

## 2017-02-23 LAB — SAMPLE TO BLOOD BANK

## 2017-02-23 LAB — FERRITIN: Ferritin: 17 ng/mL (ref 11–307)

## 2017-02-23 NOTE — Telephone Encounter (Signed)
Call from Dr [name] - pt's hgb is 8.0; needs 2 units of RBC's.   Called [name], [facility] Short Stay - appt scheduled Thursday 2/17 at 0900 AM.  Called pt - talked to [name]; informed pt's hgb dropped to 8.0 and will need 2 units of blood and appt on Thursday. Stated ok and he will inform [name].

## 2017-02-23 NOTE — Telephone Encounter (Signed)
Thanks. I will put in orders

## 2017-02-24 LAB — PREPARE RBC (CROSSMATCH)

## 2017-02-24 NOTE — Addendum Note (Signed)
Addended by: Ebbie Latus on: 02/24/2017 09:03 AM   Modules accepted: Orders

## 2017-02-25 ENCOUNTER — Ambulatory Visit (HOSPITAL_COMMUNITY)
Admission: RE | Admit: 2017-02-25 | Discharge: 2017-02-25 | Disposition: A | Discharge status: Home / Self Care | Payer: Medicare Other | Source: Ambulatory Visit | Attending: Oncology | Admitting: Oncology

## 2017-02-25 DIAGNOSIS — Q2733 Arteriovenous malformation of digestive system vessel: Secondary | ICD-10-CM | POA: Insufficient documentation

## 2017-02-25 DIAGNOSIS — D500 Iron deficiency anemia secondary to blood loss (chronic): Secondary | ICD-10-CM | POA: Insufficient documentation

## 2017-02-25 DIAGNOSIS — I780 Hereditary hemorrhagic telangiectasia: Secondary | ICD-10-CM | POA: Diagnosis present

## 2017-02-25 DIAGNOSIS — K31819 Angiodysplasia of stomach and duodenum without bleeding: Secondary | ICD-10-CM

## 2017-02-25 MED ORDER — HEPARIN SOD (PORK) LOCK FLUSH 100 UNIT/ML IV SOLN
INTRAVENOUS | Status: AC
  Administered 2017-02-25: 14:00:00 500 [IU]
  Filled 2017-02-25: qty 5

## 2017-02-25 MED ORDER — SODIUM CHLORIDE 0.9 % IV SOLN: Freq: Once | INTRAVENOUS | Status: DC

## 2017-02-26 LAB — TYPE AND SCREEN
ABO/RH(D): A POS
Antibody Screen: NEGATIVE
UNIT DIVISION: 0
UNIT DIVISION: 0

## 2017-02-26 LAB — BPAM RBC
Blood Product Expiration Date: 201902072359
Blood Product Expiration Date: 201902072359
ISSUE DATE / TIME: 201901170913
ISSUE DATE / TIME: 201901171159
UNIT TYPE AND RH: 6200
Unit Type and Rh: 6200

## 2017-02-28 IMAGING — CR DG CHEST 1V PORT
1 series · 1 of 1 positions shown · non-contrast
Comparison: 08/27/2014

CLINICAL DATA: Altered mental status. Tired. Difficult to [place].
History of diabetes and hypertension.

EXAM:
PORTABLE CHEST - 1 VIEW

[AP]
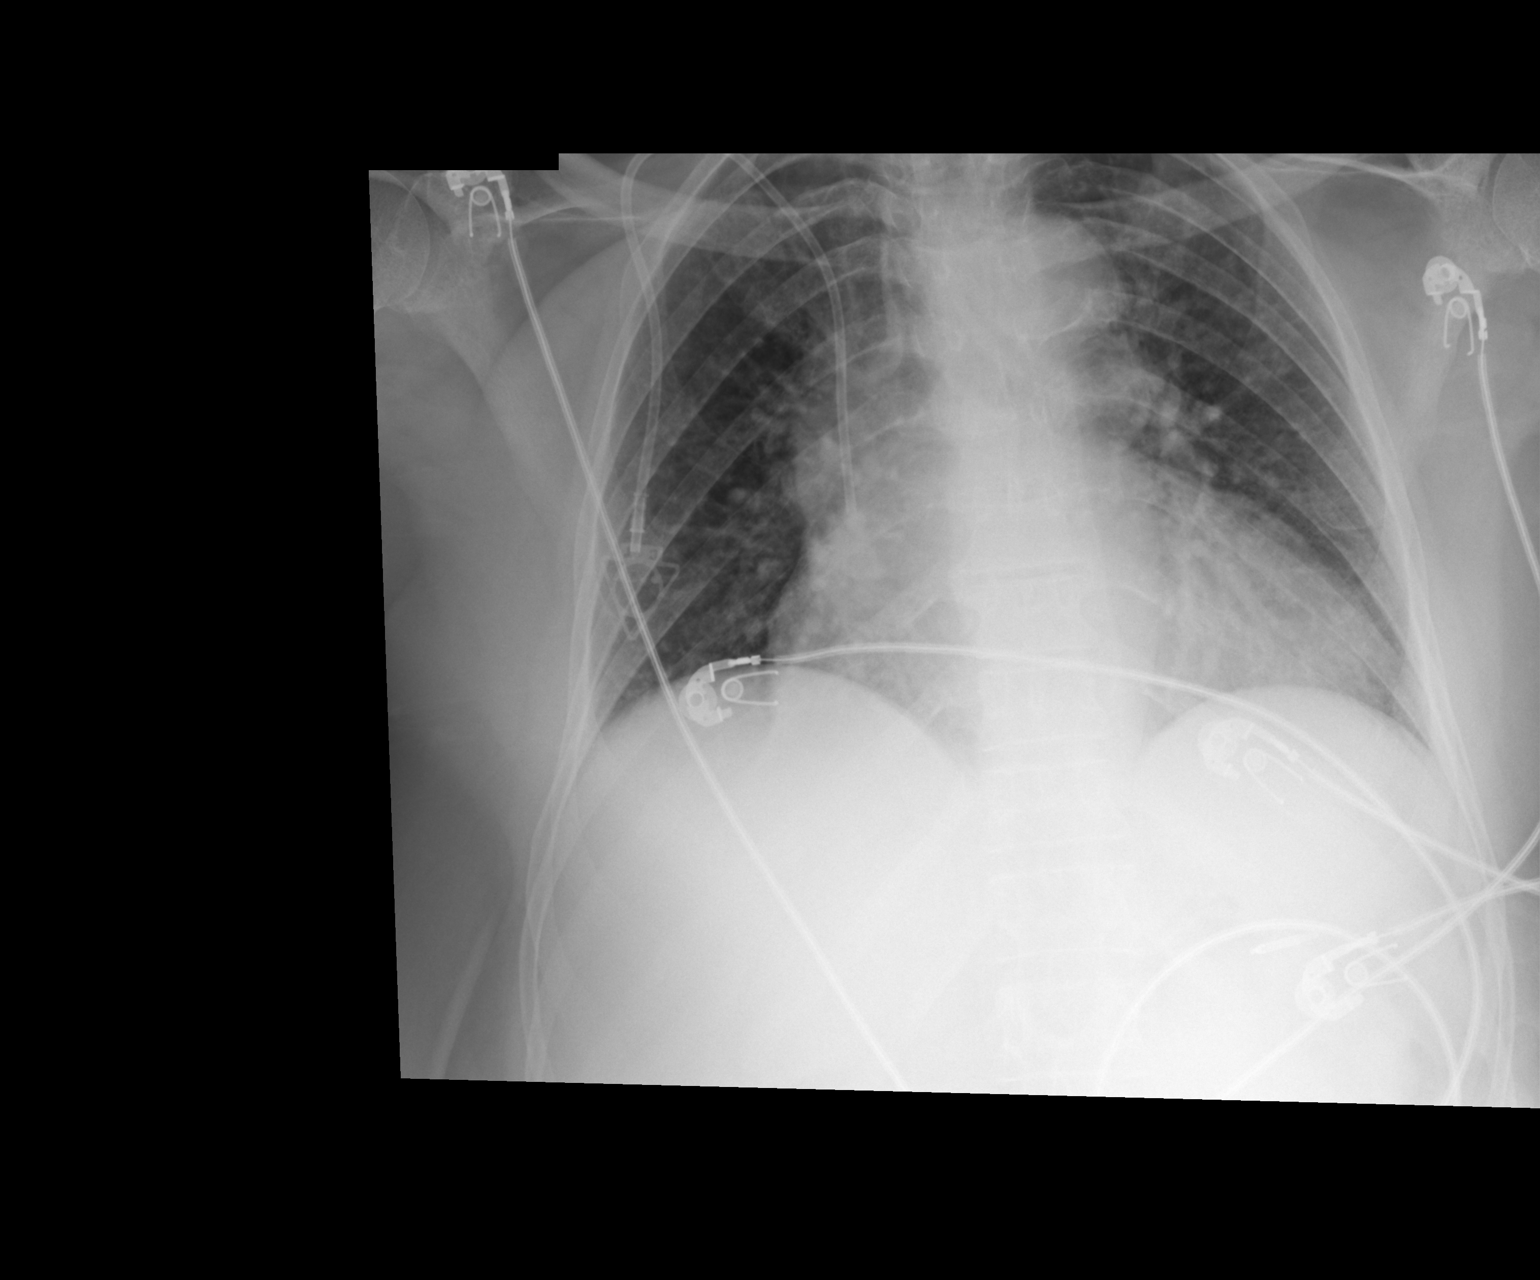

[1 of 1 positions shown; findings below may reference images not displayed]

FINDINGS: Shallow inspiration. Power port type central venous catheter with
tip over the low SVC. No pneumothorax. Cardiac enlargement with mild
pulmonary vascular congestion. No edema or consolidation. No
blunting of costophrenic angles. No pneumothorax. Calcified and
tortuous aorta.
IMPRESSION: Cardiac enlargement with mild pulmonary vascular congestion. No
edema or consolidation.

## 2017-03-01 ENCOUNTER — Other Ambulatory Visit: Payer: Medicare Other

## 2017-03-01 IMAGING — CT CT HEAD W/O CM
1 series · 16 of 30 positions shown, 20 images · non-contrast
Comparison: Head CT 08/27/2014, brain MRI 08/28/2014

CLINICAL DATA: Altered mental status.  Lethargy.

EXAM:
CT HEAD WITHOUT CONTRAST
TECHNIQUE: Contiguous axial images were obtained from the base of the skull
through the vertex without intravenous contrast.

[Series 2: head 5.0 h30s · axial · 0.44mm/px · z∈[-209,-64]mm · 16 of 33 slices shown, 20 images]
[im 2/33  brain]
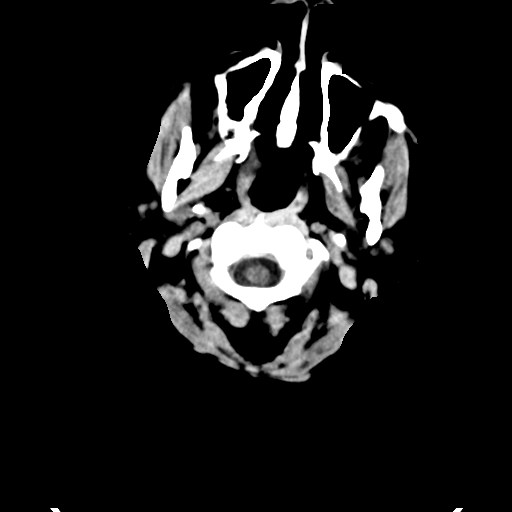
[im 2/33  bone]
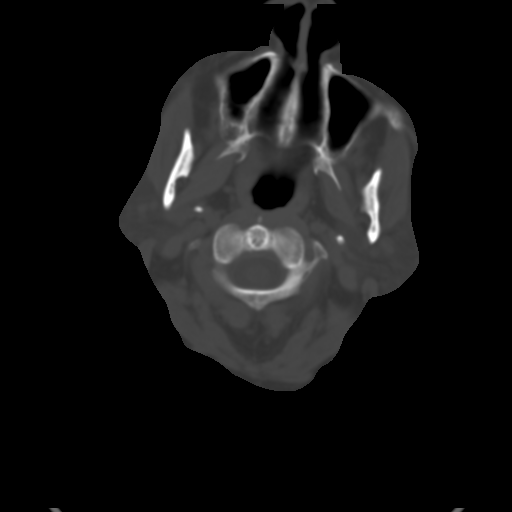
[im 4/33  brain]
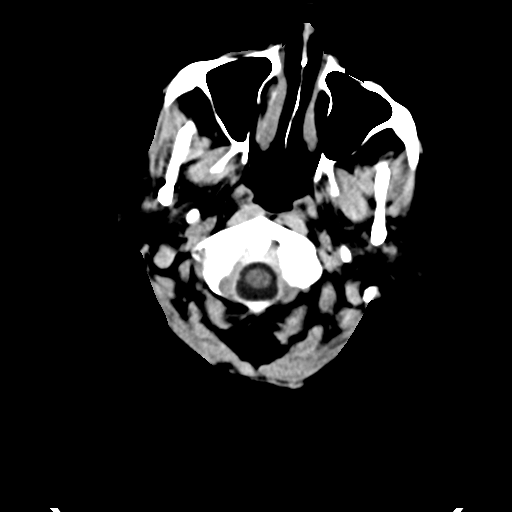
[im 6/33  brain]
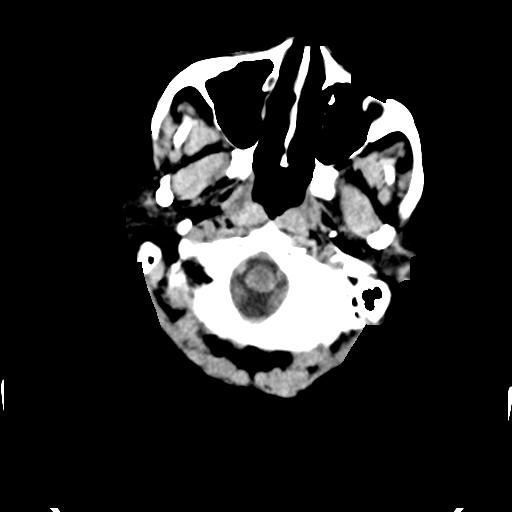
[im 8/33  brain]
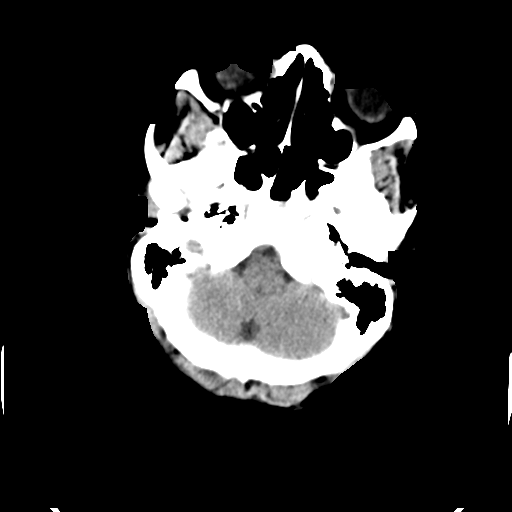
[im 9/33  brain]
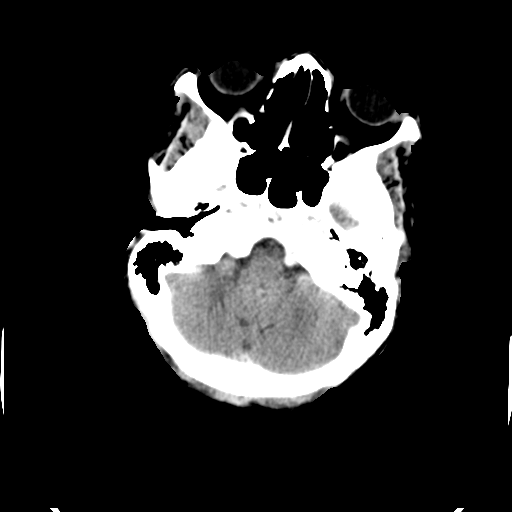
[im 9/33  bone]
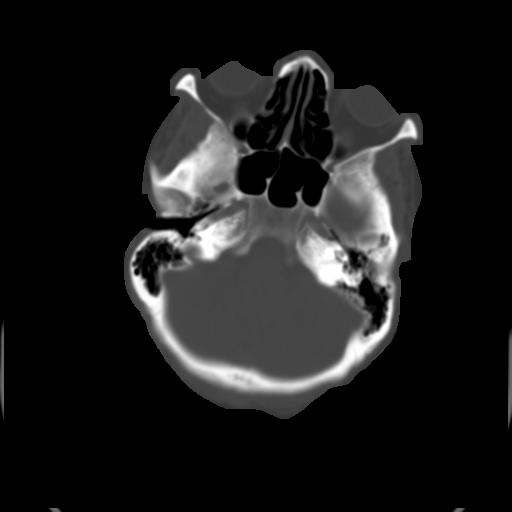
[im 12/33  brain]
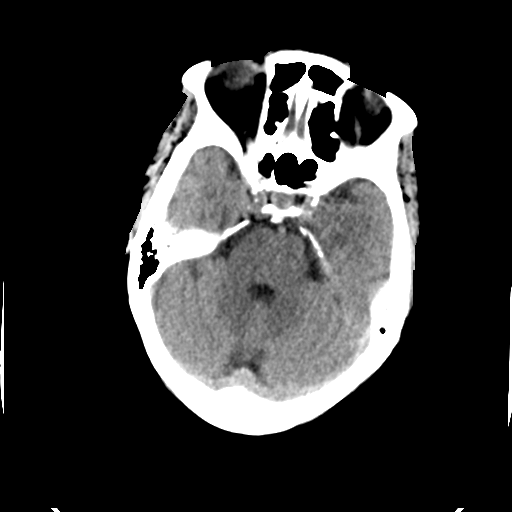
[im 14/33  brain]
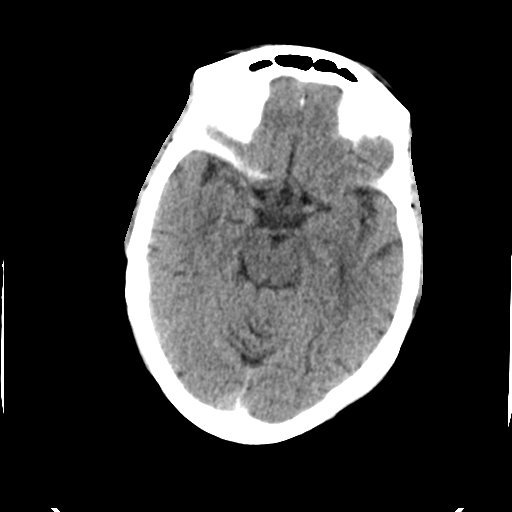
[im 16/33  brain]
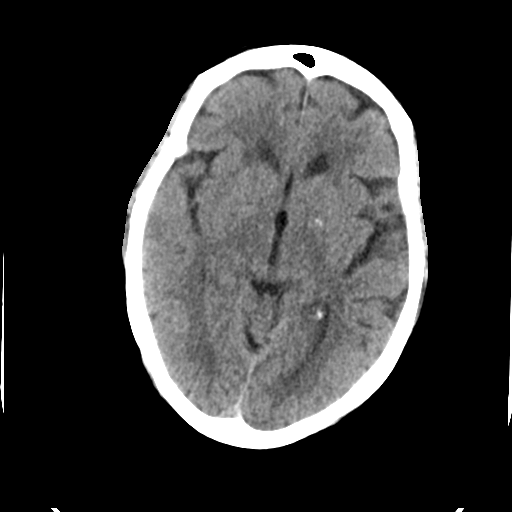
[im 17/33  brain]
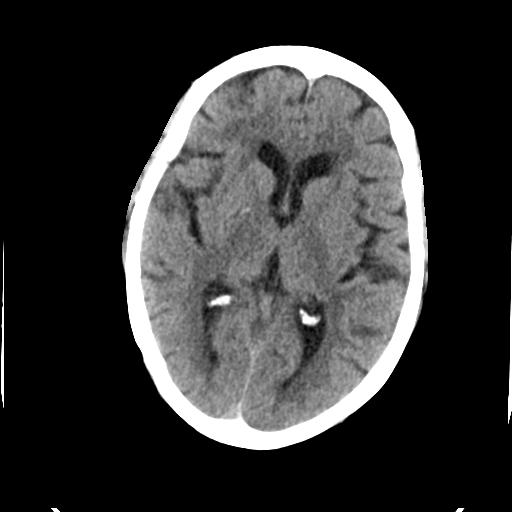
[im 17/33  bone]
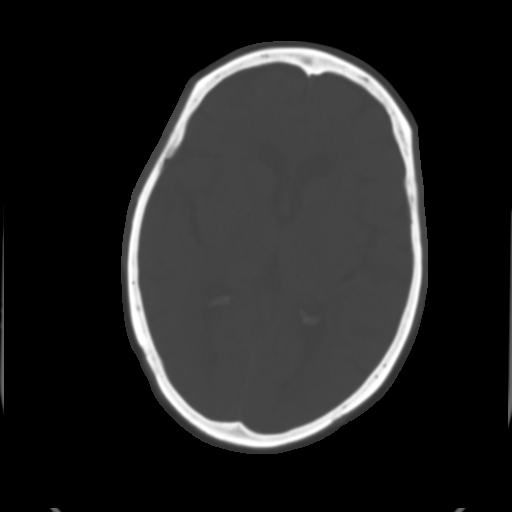
[im 19/33  brain]
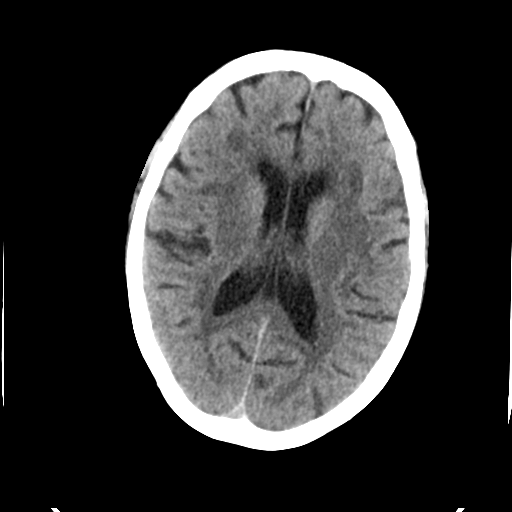
[im 21/33  brain]
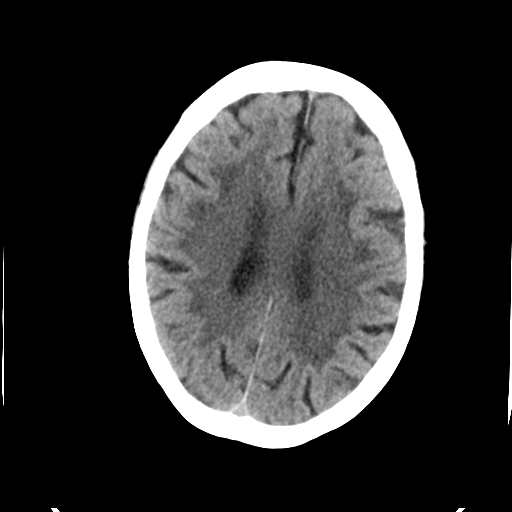
[im 24/33  brain]
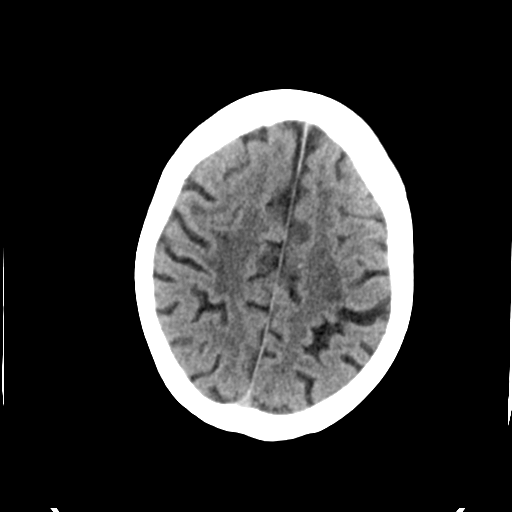
[im 25/33  brain]
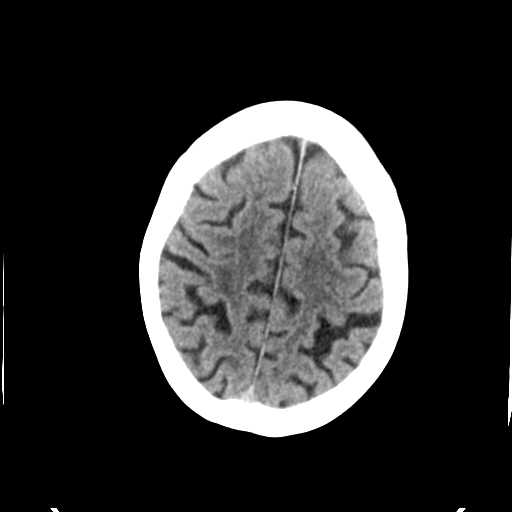
[im 25/33  bone]
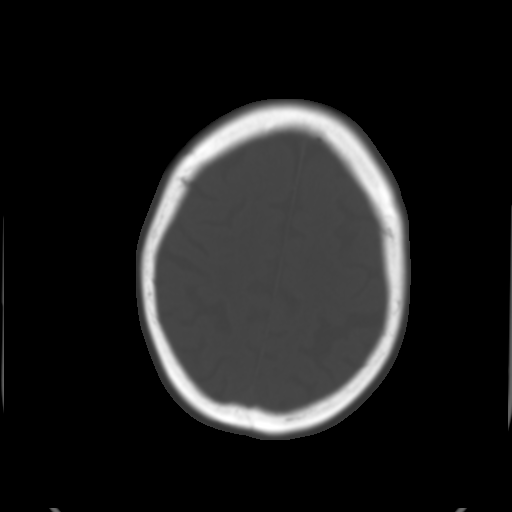
[im 27/33  brain]
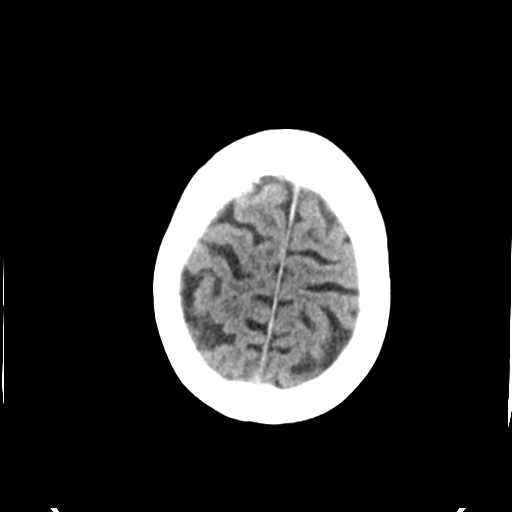
[im 29/33  brain]
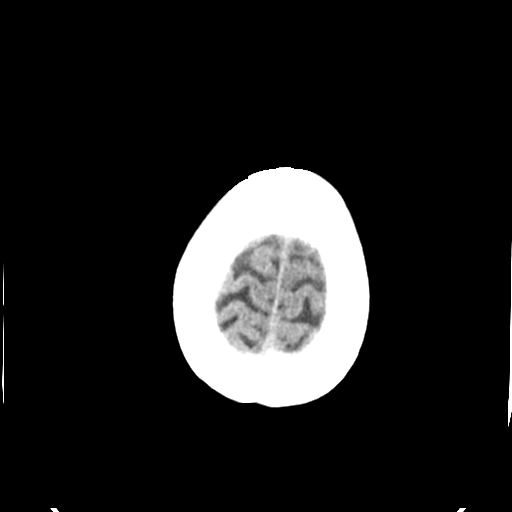
[im 31/33  brain]
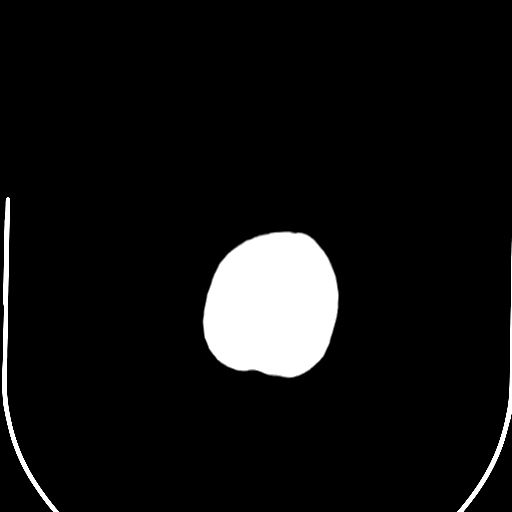

[16 of 30 positions shown; findings below may reference images not displayed]

FINDINGS: No intracranial hemorrhage, mass effect, or midline shift. No
hydrocephalus. The basilar cisterns are patent. No evidence of
territorial infarct. No intracranial fluid collection. Generalized
atrophy and chronic small vessel ischemic change, stable from prior
exam. Calvarium is intact. Included paranasal sinuses and mastoid
air cells are well aerated. Previous opacification of the right
maxillary sinus is no longer seen.
IMPRESSION: Atrophy and chronic small vessel ischemic change. No acute
intracranial abnormality.

## 2017-03-02 ENCOUNTER — Other Ambulatory Visit (INDEPENDENT_AMBULATORY_CARE_PROVIDER_SITE_OTHER): Payer: Medicare Other

## 2017-03-02 ENCOUNTER — Telehealth: Payer: Self-pay | Admitting: *Deleted

## 2017-03-02 DIAGNOSIS — D500 Iron deficiency anemia secondary to blood loss (chronic): Secondary | ICD-10-CM | POA: Diagnosis not present

## 2017-03-02 DIAGNOSIS — I780 Hereditary hemorrhagic telangiectasia: Secondary | ICD-10-CM

## 2017-03-02 DIAGNOSIS — K31811 Angiodysplasia of stomach and duodenum with bleeding: Secondary | ICD-10-CM

## 2017-03-02 DIAGNOSIS — K31819 Angiodysplasia of stomach and duodenum without bleeding: Secondary | ICD-10-CM

## 2017-03-02 LAB — CBC WITH DIFFERENTIAL/PLATELET
BASOS PCT: 0 %
Basophils Absolute: 0 10*3/uL (ref 0.0–0.1)
Eosinophils Absolute: 0.4 10*3/uL (ref 0.0–0.7)
Eosinophils Relative: 7 %
HEMATOCRIT: 25.5 % — AB (ref 36.0–46.0)
HEMOGLOBIN: 8 g/dL — AB (ref 12.0–15.0)
LYMPHS ABS: 0.7 10*3/uL (ref 0.7–4.0)
Lymphocytes Relative: 13 %
MCH: 31.6 pg (ref 26.0–34.0)
MCHC: 31.4 g/dL (ref 30.0–36.0)
MCV: 100.8 fL — ABNORMAL HIGH (ref 78.0–100.0)
MONO ABS: 0.4 10*3/uL (ref 0.1–1.0)
MONOS PCT: 7 %
NEUTROS ABS: 3.9 10*3/uL (ref 1.7–7.7)
NEUTROS PCT: 73 %
Platelets: 155 10*3/uL (ref 150–400)
RBC: 2.53 MIL/uL — ABNORMAL LOW (ref 3.87–5.11)
RDW: 16.8 % — ABNORMAL HIGH (ref 11.5–15.5)
WBC: 5.3 10*3/uL (ref 4.0–10.5)

## 2017-03-02 LAB — SAMPLE TO BLOOD BANK

## 2017-03-02 NOTE — Telephone Encounter (Signed)
Mr Botting returned my call - informed pt's hgb 8.0; same as last week. And Dr [name] wants pt to come back on Thursday for repeat lab. He stated ok.

## 2017-03-03 ENCOUNTER — Telehealth: Payer: Self-pay | Admitting: Dietician

## 2017-03-03 NOTE — Telephone Encounter (Signed)
Patient turned in a blood usgar log that showed 4 lows in the past 2 weeks 76/54/67/49/67 4 in pm and 1 early am ( 11 Pm, 12 am, 2 am, 5 am, 7 am,  Dr. Juleen China asked her to be called and insulin doses confirmed and if taking 16 units humalog with dinner to decrease it to 12 units.   Called patient and confirmed she is taking: (she confirmed the information below  with her spouse at the same time)  Lantus- 16 units in morning, 16 units before bed  Humalog - 10 humalog in the morning, 10 units humalog at lunch, 10 humalog before bed  Told her to stop Humalog be fore bed and instead to take 10 units Humalog before dinner. For example- she plans to eat tonight about 630 PM and go to bed about 9 PM. She should take the 10 units of Humalog with the 630 PM meal. Did not ask her to increase the dinner Humalog because of the low blood sugars. She repeated back the information and also asked that we write it out for her and give it to her tomorrow when she is here.

## 2017-03-04 ENCOUNTER — Other Ambulatory Visit: Payer: Self-pay | Admitting: Internal Medicine

## 2017-03-04 ENCOUNTER — Other Ambulatory Visit (INDEPENDENT_AMBULATORY_CARE_PROVIDER_SITE_OTHER): Payer: Medicare Other

## 2017-03-04 ENCOUNTER — Ambulatory Visit (INDEPENDENT_AMBULATORY_CARE_PROVIDER_SITE_OTHER): Payer: Medicare Other | Admitting: Dietician

## 2017-03-04 ENCOUNTER — Other Ambulatory Visit: Payer: Self-pay | Admitting: Oncology

## 2017-03-04 ENCOUNTER — Encounter: Payer: Self-pay | Admitting: Dietician

## 2017-03-04 ENCOUNTER — Telehealth: Payer: Self-pay | Admitting: *Deleted

## 2017-03-04 DIAGNOSIS — E1129 Type 2 diabetes mellitus with other diabetic kidney complication: Secondary | ICD-10-CM

## 2017-03-04 DIAGNOSIS — E119 Type 2 diabetes mellitus without complications: Secondary | ICD-10-CM

## 2017-03-04 DIAGNOSIS — K31819 Angiodysplasia of stomach and duodenum without bleeding: Secondary | ICD-10-CM

## 2017-03-04 DIAGNOSIS — Z794 Long term (current) use of insulin: Secondary | ICD-10-CM

## 2017-03-04 DIAGNOSIS — D500 Iron deficiency anemia secondary to blood loss (chronic): Secondary | ICD-10-CM

## 2017-03-04 DIAGNOSIS — Z713 Dietary counseling and surveillance: Secondary | ICD-10-CM

## 2017-03-04 DIAGNOSIS — I780 Hereditary hemorrhagic telangiectasia: Secondary | ICD-10-CM

## 2017-03-04 DIAGNOSIS — IMO0002 Reserved for concepts with insufficient information to code with codable children: Secondary | ICD-10-CM

## 2017-03-04 DIAGNOSIS — K31811 Angiodysplasia of stomach and duodenum with bleeding: Secondary | ICD-10-CM

## 2017-03-04 DIAGNOSIS — R040 Epistaxis: Secondary | ICD-10-CM

## 2017-03-04 DIAGNOSIS — E1165 Type 2 diabetes mellitus with hyperglycemia: Principal | ICD-10-CM

## 2017-03-04 LAB — CBC WITH DIFFERENTIAL/PLATELET
Basophils Absolute: 0 10*3/uL (ref 0.0–0.1)
Basophils Relative: 0 %
EOS PCT: 6 %
Eosinophils Absolute: 0.3 10*3/uL (ref 0.0–0.7)
HCT: 23.4 % — ABNORMAL LOW (ref 36.0–46.0)
Hemoglobin: 7.1 g/dL — ABNORMAL LOW (ref 12.0–15.0)
LYMPHS ABS: 0.6 10*3/uL — AB (ref 0.7–4.0)
LYMPHS PCT: 12 %
MCH: 31.3 pg (ref 26.0–34.0)
MCHC: 30.3 g/dL (ref 30.0–36.0)
MCV: 103.1 fL — AB (ref 78.0–100.0)
MONO ABS: 0.4 10*3/uL (ref 0.1–1.0)
Monocytes Relative: 7 %
Neutro Abs: 3.9 10*3/uL (ref 1.7–7.7)
Neutrophils Relative %: 75 %
PLATELETS: 166 10*3/uL (ref 150–400)
RBC: 2.27 MIL/uL — ABNORMAL LOW (ref 3.87–5.11)
RDW: 16.7 % — AB (ref 11.5–15.5)
WBC: 5.2 10*3/uL (ref 4.0–10.5)

## 2017-03-04 LAB — SAMPLE TO BLOOD BANK

## 2017-03-04 MED ORDER — INSULIN LISPRO 100 UNIT/ML (KWIKPEN): PEN_INJECTOR | SUBCUTANEOUS | 3 refills | Status: DC

## 2017-03-04 NOTE — Telephone Encounter (Signed)
Dr Darnell Level stated pt can come back on Monday 2/4 for f/u labs. Talked to Mr Burford - Mrs Delarocha does not have to come back on Monday 1/28 but wait until 2/4 . Voiced understanding.

## 2017-03-04 NOTE — Addendum Note (Signed)
Addended by: Truddie Crumble on: 03/04/2017 11:56 AM   Modules accepted: Orders

## 2017-03-04 NOTE — Patient Instructions (Signed)
Breakfast Time: (9 AM) Lantus- 16 units Humalog- 10 units BEFORE eating    Dinner Time: (12 noon) Humalog- 10 units BEFORE eating    Supper Time: (6 PM) Lantus- 16 units Humalog- 10 units BEFORE eating    Bedtime- no insulin

## 2017-03-04 NOTE — Progress Notes (Addendum)
Diabetes Self-Management Education  Visit Type: Follow-up  Appt. Start Time: 1115 Appt. End Time: 1130  03/04/2017  [name], identified by name and date of birth, is a 77 y.o. female with a diagnosis of Diabetes:  Marland Kitchen Type 2.   She is here with her husband, [name].   ASSESSMENT  She was here for lab.  her last weight was 150# with a BMI of 26.57 that ios healthy for her age.  Meter was downloaded two days ago and again today. CBGs this am 153/138 both fasting, no CBGs yesterday, fasting 119 on 03/02/17 Meter download average 158 which is about a 7% A1C. This is goal for her.  Diabetes Self-Management Education - 03/04/17 1300      Visit Information   Visit Type  Follow-up      Health Coping   How would you rate your overall health?  Good      Subsequent Visit   Since your last visit have you continued or begun to take your medications as prescribed?  Yes    Since your last visit have you had your blood pressure checked?  Yes    Is your most recent blood pressure lower, unchanged, or higher since your last visit?  Unchanged    Since your last visit have you experienced any weight changes?  No change       Individualized Plan for Diabetes Self-Management Training:   Learning Objective:  Patient will have a greater understanding of diabetes self-management. Patient education plan is to attend individual and/or group sessions per assessed needs and concerns.  My plan to support myself in continuing these changes to care for my diabetes is to attend or contact:   Her spouse is very supportive and she knows to ask doctor's office, CDE, Dietitian, pharmacist, church   Plan:   Patient Instructions  Breakfast Time: (9 AM) Lantus- 16 units Humalog- 10 units BEFORE eating    Dinner Time: (12 noon) Humalog- 10 units BEFORE eating    Supper Time: (6 PM) Lantus- 16 units Humalog- 10 units BEFORE eating    Bedtime- no insulin   Expected Outcomes:   patient to take  insulin per instructions  Education material provided: avs If problems or questions, patient to contact team via:  Phone  Future DSME appointment:  2 months  Debera Lat, RD 03/04/2017 1:48 PM.

## 2017-03-04 NOTE — Telephone Encounter (Signed)
Hgb down to 7.1; Dr [name] informed. Appt scheduled for tomorrow at the [facility] for 2 units of PRBC's per Dr Darnell Level. Called pt - talked to her husband - informed Hgb dropped to 7.1 and she will need blood infusion per Dr Darnell Level. Informed of appt at the [facility] scheduled for tomorrow @ 0800 AM.

## 2017-03-04 NOTE — Telephone Encounter (Signed)
Pt has arrived for her lab appt today.

## 2017-03-04 NOTE — Telephone Encounter (Signed)
-----   Message from [name], MD sent at 03/02/2017  6:51 PM EST ----- Let's get her back Thursday this week for a CBC

## 2017-03-05 ENCOUNTER — Inpatient Hospital Stay: Payer: Medicare Other | Attending: Oncology

## 2017-03-05 ENCOUNTER — Inpatient Hospital Stay: Payer: Medicare Other

## 2017-03-05 DIAGNOSIS — I780 Hereditary hemorrhagic telangiectasia: Secondary | ICD-10-CM | POA: Insufficient documentation

## 2017-03-05 DIAGNOSIS — D500 Iron deficiency anemia secondary to blood loss (chronic): Secondary | ICD-10-CM | POA: Diagnosis not present

## 2017-03-05 DIAGNOSIS — K31819 Angiodysplasia of stomach and duodenum without bleeding: Secondary | ICD-10-CM

## 2017-03-05 DIAGNOSIS — Q2733 Arteriovenous malformation of digestive system vessel: Secondary | ICD-10-CM | POA: Insufficient documentation

## 2017-03-05 DIAGNOSIS — R040 Epistaxis: Secondary | ICD-10-CM

## 2017-03-05 DIAGNOSIS — K31811 Angiodysplasia of stomach and duodenum with bleeding: Secondary | ICD-10-CM

## 2017-03-05 LAB — CBC WITH DIFFERENTIAL/PLATELET
Basophils Absolute: 0 10*3/uL (ref 0.0–0.1)
Basophils Relative: 1 %
EOS ABS: 0.3 10*3/uL (ref 0.0–0.5)
EOS PCT: 6 %
HCT: 23.5 % — ABNORMAL LOW (ref 34.8–46.6)
HEMOGLOBIN: 7.5 g/dL — AB (ref 11.6–15.9)
LYMPHS ABS: 0.5 10*3/uL — AB (ref 0.9–3.3)
LYMPHS PCT: 10 %
MCH: 31.8 pg (ref 25.1–34.0)
MCHC: 31.8 g/dL (ref 31.5–36.0)
MCV: 99.9 fL (ref 79.5–101.0)
MONOS PCT: 9 %
Monocytes Absolute: 0.5 10*3/uL (ref 0.1–0.9)
Neutro Abs: 3.9 10*3/uL (ref 1.5–6.5)
Neutrophils Relative %: 74 %
PLATELETS: 173 10*3/uL (ref 145–400)
RBC: 2.36 MIL/uL — ABNORMAL LOW (ref 3.70–5.45)
RDW: 16.3 % — ABNORMAL HIGH (ref 11.2–16.1)
WBC: 5.2 10*3/uL (ref 3.9–10.3)

## 2017-03-05 LAB — PREPARE RBC (CROSSMATCH)

## 2017-03-05 LAB — ABO/RH: ABO/RH(D): A POS

## 2017-03-05 LAB — SAMPLE TO BLOOD BANK

## 2017-03-05 MED ORDER — SODIUM CHLORIDE 0.9 % IV SOLN
Freq: Once | INTRAVENOUS | Status: AC
  Administered 2017-03-05: 09:00:00 via INTRAVENOUS

## 2017-03-05 NOTE — Patient Instructions (Signed)

## 2017-03-07 LAB — BPAM RBC
Blood Product Expiration Date: 201902082359
Blood Product Expiration Date: 201902082359
ISSUE DATE / TIME: 201901251004
ISSUE DATE / TIME: 201901251004
UNIT TYPE AND RH: 6200
Unit Type and Rh: 6200

## 2017-03-07 LAB — TYPE AND SCREEN
ABO/RH(D): A POS
Antibody Screen: NEGATIVE
UNIT DIVISION: 0
Unit division: 0

## 2017-03-08 ENCOUNTER — Other Ambulatory Visit: Payer: Medicare Other

## 2017-03-15 ENCOUNTER — Other Ambulatory Visit (INDEPENDENT_AMBULATORY_CARE_PROVIDER_SITE_OTHER): Payer: Medicare Other

## 2017-03-15 ENCOUNTER — Other Ambulatory Visit: Payer: Self-pay | Admitting: Oncology

## 2017-03-15 DIAGNOSIS — D500 Iron deficiency anemia secondary to blood loss (chronic): Secondary | ICD-10-CM

## 2017-03-15 DIAGNOSIS — I780 Hereditary hemorrhagic telangiectasia: Secondary | ICD-10-CM

## 2017-03-15 DIAGNOSIS — K31819 Angiodysplasia of stomach and duodenum without bleeding: Secondary | ICD-10-CM

## 2017-03-15 DIAGNOSIS — Q2733 Arteriovenous malformation of digestive system vessel: Secondary | ICD-10-CM

## 2017-03-15 DIAGNOSIS — K31811 Angiodysplasia of stomach and duodenum with bleeding: Secondary | ICD-10-CM

## 2017-03-15 LAB — CBC WITH DIFFERENTIAL/PLATELET
BASOS ABS: 0 10*3/uL (ref 0.0–0.1)
Basophils Relative: 0 %
Eosinophils Absolute: 0.3 10*3/uL (ref 0.0–0.7)
Eosinophils Relative: 7 %
HEMATOCRIT: 25.9 % — AB (ref 36.0–46.0)
Hemoglobin: 7.9 g/dL — ABNORMAL LOW (ref 12.0–15.0)
LYMPHS ABS: 0.6 10*3/uL — AB (ref 0.7–4.0)
LYMPHS PCT: 14 %
MCH: 30.4 pg (ref 26.0–34.0)
MCHC: 30.5 g/dL (ref 30.0–36.0)
MCV: 99.6 fL (ref 78.0–100.0)
MONO ABS: 0.3 10*3/uL (ref 0.1–1.0)
MONOS PCT: 7 %
Neutro Abs: 3 10*3/uL (ref 1.7–7.7)
Neutrophils Relative %: 72 %
PLATELETS: 174 10*3/uL (ref 150–400)
RBC: 2.6 MIL/uL — ABNORMAL LOW (ref 3.87–5.11)
RDW: 15.7 % — AB (ref 11.5–15.5)
WBC: 4.2 10*3/uL (ref 4.0–10.5)

## 2017-03-15 LAB — SAMPLE TO BLOOD BANK

## 2017-03-15 NOTE — Progress Notes (Addendum)
Pt here to have labs drawn-Hgb resulted to 7.9.  Dr.Granfortuna contacted with results-verbal order given to have patient come in for blood transfusion any day this week.  Appt scheduled for Fri Feb 8th at 8am in Short Stay [facility]).  Pt also has an appt to see her pcp Juleen China) on Feb 7th, a lab draw will need to be obtained at that time for a type and screen as blood bank unable to use blood obtained from today.  Pt aware of both appts.Despina Hidden Cassady2/4/20194:48 PM

## 2017-03-18 ENCOUNTER — Encounter: Payer: Self-pay | Admitting: Internal Medicine

## 2017-03-18 ENCOUNTER — Ambulatory Visit (INDEPENDENT_AMBULATORY_CARE_PROVIDER_SITE_OTHER): Payer: Medicare Other | Admitting: Internal Medicine

## 2017-03-18 ENCOUNTER — Other Ambulatory Visit: Payer: Self-pay

## 2017-03-18 DIAGNOSIS — L602 Onychogryphosis: Secondary | ICD-10-CM | POA: Insufficient documentation

## 2017-03-18 DIAGNOSIS — E1129 Type 2 diabetes mellitus with other diabetic kidney complication: Secondary | ICD-10-CM

## 2017-03-18 DIAGNOSIS — K31819 Angiodysplasia of stomach and duodenum without bleeding: Secondary | ICD-10-CM

## 2017-03-18 DIAGNOSIS — I780 Hereditary hemorrhagic telangiectasia: Secondary | ICD-10-CM | POA: Diagnosis not present

## 2017-03-18 DIAGNOSIS — D500 Iron deficiency anemia secondary to blood loss (chronic): Secondary | ICD-10-CM

## 2017-03-18 DIAGNOSIS — E1151 Type 2 diabetes mellitus with diabetic peripheral angiopathy without gangrene: Secondary | ICD-10-CM

## 2017-03-18 DIAGNOSIS — E1122 Type 2 diabetes mellitus with diabetic chronic kidney disease: Secondary | ICD-10-CM

## 2017-03-18 DIAGNOSIS — IMO0002 Reserved for concepts with insufficient information to code with codable children: Secondary | ICD-10-CM

## 2017-03-18 DIAGNOSIS — Q2733 Arteriovenous malformation of digestive system vessel: Secondary | ICD-10-CM

## 2017-03-18 DIAGNOSIS — K31811 Angiodysplasia of stomach and duodenum with bleeding: Secondary | ICD-10-CM | POA: Diagnosis not present

## 2017-03-18 DIAGNOSIS — E1165 Type 2 diabetes mellitus with hyperglycemia: Secondary | ICD-10-CM

## 2017-03-18 DIAGNOSIS — I739 Peripheral vascular disease, unspecified: Secondary | ICD-10-CM

## 2017-03-18 DIAGNOSIS — E1121 Type 2 diabetes mellitus with diabetic nephropathy: Secondary | ICD-10-CM

## 2017-03-18 HISTORY — DX: Onychogryphosis: L60.2

## 2017-03-18 LAB — PREPARE RBC (CROSSMATCH)

## 2017-03-18 MED ORDER — GLUCOSE BLOOD VI STRP: ORAL_STRIP | 12 refills | Status: DC

## 2017-03-18 MED ORDER — INSULIN LISPRO 100 UNIT/ML (KWIKPEN): PEN_INJECTOR | SUBCUTANEOUS | 3 refills | Status: DC

## 2017-03-18 MED ORDER — INSULIN GLARGINE 100 UNIT/ML SOLOSTAR PEN: 16.0000 [IU] | PEN_INJECTOR | Freq: Two times a day (BID) | SUBCUTANEOUS | 2 refills | Status: DC

## 2017-03-18 NOTE — Assessment & Plan Note (Addendum)
Assessment: We recently titrated her insulin due to some lower PM readings.  Her monitor today shows an average CBG today of 193.  She is prescribed Lantus 16 units BID and Novolog 10units TID.  She reports adherence although when queried was uncertain if she was taking Lantus once or twice per day.  She denies any hyperglycemic or hypoglycemic symptoms.  Plan: - Continue with current regimen as described above.   - If unable to obtain better glycemic control, we could consider V-go or Tyler Aas as alternative options - RTC 3 months

## 2017-03-18 NOTE — Patient Instructions (Signed)
Thank you for coming to see me today. It was a pleasure. Today we talked about:   I have refilled your insulin and test strips.  I have ordered physical therapy for you and also placed a referral to podiatry for nail care.  Please follow-up with me in 3 months.  If you have any questions or concerns, please do not hesitate to call the office at [phone].  Take Care,   [name], DO

## 2017-03-18 NOTE — Progress Notes (Signed)
CC: here for follow up PVD and DM  HPI:  [name] is a 77 y.o. woman with a past medical history listed below here today for follow up of her PVD and DM.  For details of today's visit and the status of her chronic medical issues please refer to the assessment and plan.   Past Medical History:  Diagnosis Date  . CHF (congestive heart failure) ([place])   . Chronic anemia   . Chronic diastolic CHF (congestive heart failure) ([place]) 10/03/2013  . Chronic GI bleeding    [name] 11/29/2014  . Family history of anesthesia complication    "niece has a hard time coming out" (09/15/2012)  . Frequent nosebleeds    chronic  . Gastric AV malformation    [name] 11/29/2014  . GERD (gastroesophageal reflux disease)   . Heart murmur   . History of blood transfusion "several"  . History of epistaxis   . HTN (hypertension), benign 03/02/2012  . Hyperlipidemia   . Iron deficiency anemia    chronic infusions"  . Lichen planus    Both lower extremities  . 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 Pneumonia 1990's X 2  . Seizures ([place]) 09/2014  . Telangiectasia    Gastric   . 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   Review of Systems:  Please see pertinent ROS reviewed in HPI and problem based charting.   Physical Exam:  Vitals:   03/18/17 1558  BP: (!) 121/38  Pulse: (!) 58  Temp: 98.4 F (36.9 C)  TempSrc: Oral  SpO2: 97%  Weight: 164 lb 9.6 oz (74.7 kg)  Height: 5\' 3"  (1.6 m)   Physical Exam  Constitutional: She is oriented to person, place, and time and well-developed, well-nourished, and in no distress.  Pulmonary/Chest: Effort normal.  Musculoskeletal: She exhibits no edema.  Her toenails are overgrown on both feet.  In particular, her left 2nd toe is significantly overgrown with evidence of onychomycosis.   Neurological: She is alert and oriented to person, place, and time.  Skin: Skin is warm and dry.  Psychiatric: Mood and affect normal.      Assessment & Plan:   See Encounters Tab for problem based charting.  Patient discussed with Dr. Angelia Mould .  Peripheral vascular disease ([place]) Assessment: She was seen by vascular surgery recently.  There are no current plans for intervention.  She was encouraged to exercise.  Today, she reports some left knee pain and occasionally worried that she will fall due to weakness or imbalance.  She is interested in working with phsyical therapy.  Plan: Referral to PT Exercise encouraged F/u with VVS as needed Not a candidate for aspirin  DM (diabetes mellitus), type 2, uncontrolled, with renal complications ([place]) Assessment: We recently titrated her insulin due to some lower PM readings.  Her monitor today shows an average CBG today of 193.  She is prescribed Lantus 16 units BID and Novolog 10units TID.  She reports adherence although when queried was uncertain if she was taking Lantus once or twice per day.  She denies any hyperglycemic or hypoglycemic symptoms.  Plan: - Continue with current regimen as described above.   - If unable to obtain better glycemic control, we could consider [place] or [place] as alternative options - RTC 3 months  Overgrown toenails Assessment: On exam, her toe nails are overgrown with some evidence of onychomycosis.  Given her history of DM and, per her  request, I think referral to podiatry to assist with foot and nail care would be beneficial.  Plan: - Ambulatory referrral to podiatry.

## 2017-03-18 NOTE — Assessment & Plan Note (Signed)
Assessment: On exam, her toe nails are overgrown with some evidence of onychomycosis.  Given her history of DM and, per her request, I think referral to podiatry to assist with foot and nail care would be beneficial.  Plan: - Ambulatory referrral to podiatry.

## 2017-03-18 NOTE — Assessment & Plan Note (Signed)
Assessment: She was seen by vascular surgery recently.  There are no current plans for intervention.  She was encouraged to exercise.  Today, she reports some left knee pain and occasionally worried that she will fall due to weakness or imbalance.  She is interested in working with phsyical therapy.  Plan: Referral to PT Exercise encouraged F/u with VVS as needed Not a candidate for aspirin

## 2017-03-18 NOTE — Addendum Note (Signed)
Addended by: Marcelino Duster on: 03/18/2017 04:45 PM   Modules accepted: Orders

## 2017-03-19 ENCOUNTER — Ambulatory Visit (HOSPITAL_COMMUNITY)
Admission: RE | Admit: 2017-03-19 | Discharge: 2017-03-19 | Disposition: A | Discharge status: Home / Self Care | Payer: Medicare Other | Source: Ambulatory Visit | Attending: Oncology | Admitting: Oncology

## 2017-03-19 DIAGNOSIS — Q2733 Arteriovenous malformation of digestive system vessel: Secondary | ICD-10-CM | POA: Insufficient documentation

## 2017-03-19 DIAGNOSIS — I780 Hereditary hemorrhagic telangiectasia: Secondary | ICD-10-CM | POA: Insufficient documentation

## 2017-03-19 DIAGNOSIS — K31819 Angiodysplasia of stomach and duodenum without bleeding: Secondary | ICD-10-CM

## 2017-03-19 DIAGNOSIS — D500 Iron deficiency anemia secondary to blood loss (chronic): Secondary | ICD-10-CM | POA: Diagnosis present

## 2017-03-19 MED ORDER — SODIUM CHLORIDE 0.9 % IV SOLN
Freq: Once | INTRAVENOUS | Status: AC
  Administered 2017-03-19: 08:00:00 via INTRAVENOUS

## 2017-03-19 MED ORDER — HEPARIN SOD (PORK) LOCK FLUSH 100 UNIT/ML IV SOLN
INTRAVENOUS | Status: AC
  Administered 2017-03-19: 500 [IU]
  Filled 2017-03-19: qty 5

## 2017-03-19 NOTE — Addendum Note (Signed)
Addended by: Mignon Pine on: 03/19/2017 09:20 AM   Modules accepted: Orders

## 2017-03-20 LAB — TYPE AND SCREEN
ABO/RH(D): A POS
ANTIBODY SCREEN: NEGATIVE
Unit division: 0
Unit division: 0

## 2017-03-20 LAB — BPAM RBC
BLOOD PRODUCT EXPIRATION DATE: 201903032359
Blood Product Expiration Date: 201902282359
ISSUE DATE / TIME: 201902080822
ISSUE DATE / TIME: 201902081041
UNIT TYPE AND RH: 6200
Unit Type and Rh: 6200

## 2017-03-20 NOTE — Progress Notes (Signed)
Internal Medicine Clinic Attending  Case discussed with Dr. Wallace at the time of the visit.  We reviewed the resident's history and exam and pertinent patient test results.  I agree with the assessment, diagnosis, and plan of care documented in the resident's note.  

## 2017-03-25 ENCOUNTER — Other Ambulatory Visit: Payer: Self-pay | Admitting: Oncology

## 2017-03-25 ENCOUNTER — Telehealth: Payer: Self-pay | Admitting: *Deleted

## 2017-03-25 DIAGNOSIS — I780 Hereditary hemorrhagic telangiectasia: Secondary | ICD-10-CM

## 2017-03-25 DIAGNOSIS — D500 Iron deficiency anemia secondary to blood loss (chronic): Secondary | ICD-10-CM

## 2017-03-25 DIAGNOSIS — K31819 Angiodysplasia of stomach and duodenum without bleeding: Secondary | ICD-10-CM

## 2017-03-25 NOTE — Telephone Encounter (Signed)
Talked to Arabi - appt for IV iron scheduled Wed 2/20 @ 0800 AM. Wants to know if she will be getting 1 or 2? And I will call Mrs. Friedly.

## 2017-03-25 NOTE — Telephone Encounter (Signed)
I will try for 2 but maybe we can coordinate one with a blood transfusion since I'm sure she will need another one by then

## 2017-03-25 NOTE — Telephone Encounter (Signed)
Talked to [name]- informed lab appt on  Monday 2/18 and iron infusion Wed 2/20 @ 0800 AM; voiced understanding.

## 2017-03-25 NOTE — Telephone Encounter (Signed)
-----   Message from [name], MD sent at 03/24/2017  5:25 PM EST ----- We can wait until the 18th. See if they have a spot to give her IV iron this week or next ----- Message ----- From: Ebbie Latus, RN Sent: 03/24/2017   4:41 PM To: Ebbie Latus, RN, [name], MD  DR G  She had blood transfusion on Fri 2/8. Do u want her come in this week or wait until Monday 2/18? Thanks Holley Raring ----- Message ----- From: Meriam Sprague Sent: 03/24/2017   4:26 PM To: Ebbie Latus, RN    ----- Message ----- From: Meriam Sprague Sent: 03/24/2017   9:42 AM To: Atha Starks  Patient called this morning stating she needs to come in for labs.  Please let me know when you get a free moment.  Thanks,  Darcel Bayley

## 2017-03-29 ENCOUNTER — Other Ambulatory Visit: Payer: Medicare Other

## 2017-03-29 ENCOUNTER — Telehealth: Payer: Self-pay | Admitting: *Deleted

## 2017-03-29 DIAGNOSIS — K31811 Angiodysplasia of stomach and duodenum with bleeding: Secondary | ICD-10-CM

## 2017-03-29 DIAGNOSIS — I780 Hereditary hemorrhagic telangiectasia: Secondary | ICD-10-CM

## 2017-03-29 DIAGNOSIS — K31819 Angiodysplasia of stomach and duodenum without bleeding: Secondary | ICD-10-CM

## 2017-03-29 DIAGNOSIS — D500 Iron deficiency anemia secondary to blood loss (chronic): Secondary | ICD-10-CM

## 2017-03-29 LAB — FERRITIN: FERRITIN: 77 ng/mL (ref 11–307)

## 2017-03-29 LAB — CBC WITH DIFFERENTIAL/PLATELET
BASOS ABS: 0 10*3/uL (ref 0.0–0.1)
BASOS PCT: 0 %
EOS ABS: 0.4 10*3/uL (ref 0.0–0.7)
EOS PCT: 7 %
HCT: 26.9 % — ABNORMAL LOW (ref 36.0–46.0)
HEMOGLOBIN: 8.3 g/dL — AB (ref 12.0–15.0)
Lymphocytes Relative: 12 %
Lymphs Abs: 0.6 10*3/uL — ABNORMAL LOW (ref 0.7–4.0)
MCH: 30.5 pg (ref 26.0–34.0)
MCHC: 30.9 g/dL (ref 30.0–36.0)
MCV: 98.9 fL (ref 78.0–100.0)
Monocytes Absolute: 0.3 10*3/uL (ref 0.1–1.0)
Monocytes Relative: 5 %
NEUTROS PCT: 76 %
Neutro Abs: 3.8 10*3/uL (ref 1.7–7.7)
Platelets: 165 10*3/uL (ref 150–400)
RBC: 2.72 MIL/uL — AB (ref 3.87–5.11)
RDW: 17.9 % — ABNORMAL HIGH (ref 11.5–15.5)
WBC: 5.1 10*3/uL (ref 4.0–10.5)

## 2017-03-29 LAB — SAMPLE TO BLOOD BANK

## 2017-03-29 NOTE — Telephone Encounter (Signed)
-----   Message from [name], MD sent at 03/29/2017  1:13 PM EST ----- Call pt: no blood this week. Will need probably  blood next week; let's see if we can reserve a short stay bed for 2/25

## 2017-03-29 NOTE — Telephone Encounter (Signed)
Called pt - talked to [name], informed "no blood this week. Will need probably blood next week" per Dr [name]. Aware of appt this Wed for iron infusion.  Reserved bed for Tues 2/26 @ 0800 AM for blood transfusion; I will call pt next week if bed is needed.

## 2017-03-31 ENCOUNTER — Ambulatory Visit (HOSPITAL_COMMUNITY)
Admission: RE | Admit: 2017-03-31 | Discharge: 2017-03-31 | Disposition: A | Discharge status: Home / Self Care | Payer: Medicare Other | Source: Ambulatory Visit | Attending: Oncology | Admitting: Oncology

## 2017-03-31 DIAGNOSIS — I780 Hereditary hemorrhagic telangiectasia: Secondary | ICD-10-CM

## 2017-03-31 MED ORDER — SODIUM CHLORIDE 0.9 % IV SOLN
510.0000 mg | INTRAVENOUS | Status: DC
  Administered 2017-03-31: 09:00:00 510 mg via INTRAVENOUS
  Filled 2017-03-31: qty 17

## 2017-03-31 MED ORDER — HEPARIN SOD (PORK) LOCK FLUSH 100 UNIT/ML IV SOLN
INTRAVENOUS | Status: AC
  Administered 2017-03-31: 09:00:00 500 [IU]
  Filled 2017-03-31: qty 5

## 2017-04-01 ENCOUNTER — Encounter: Payer: Self-pay | Admitting: Cardiology

## 2017-04-01 ENCOUNTER — Ambulatory Visit: Payer: Medicare Other | Admitting: Cardiology

## 2017-04-01 VITALS — BP 128/54 | HR 75 | Ht 63.0 in | Wt 165.6 lb

## 2017-04-01 DIAGNOSIS — I5032 Chronic diastolic (congestive) heart failure: Secondary | ICD-10-CM | POA: Diagnosis not present

## 2017-04-01 DIAGNOSIS — I272 Pulmonary hypertension, unspecified: Secondary | ICD-10-CM | POA: Diagnosis not present

## 2017-04-01 DIAGNOSIS — R011 Cardiac murmur, unspecified: Secondary | ICD-10-CM

## 2017-04-01 DIAGNOSIS — I10 Essential (primary) hypertension: Secondary | ICD-10-CM | POA: Diagnosis not present

## 2017-04-01 HISTORY — DX: Cardiac murmur, unspecified: R01.1

## 2017-04-01 HISTORY — DX: Pulmonary hypertension, unspecified: I27.20

## 2017-04-01 LAB — BASIC METABOLIC PANEL
BUN / CREAT RATIO: 28 (ref 12–28)
BUN: 32 mg/dL — ABNORMAL HIGH (ref 8–27)
CHLORIDE: 109 mmol/L — AB (ref 96–106)
CO2: 23 mmol/L (ref 20–29)
Calcium: 9.7 mg/dL (ref 8.7–10.3)
Creatinine, Ser: 1.13 mg/dL — ABNORMAL HIGH (ref 0.57–1.00)
GFR calc non Af Amer: 47 mL/min/{1.73_m2} — ABNORMAL LOW (ref >59)
GFR, EST AFRICAN AMERICAN: 54 mL/min/{1.73_m2} — AB (ref >59)
Glucose: 270 mg/dL — ABNORMAL HIGH (ref 65–99)
POTASSIUM: 5.1 mmol/L (ref 3.5–5.2)
SODIUM: 145 mmol/L — AB (ref 134–144)

## 2017-04-01 LAB — PRO B NATRIURETIC PEPTIDE: NT-PRO BNP: 133 pg/mL (ref 0–738)

## 2017-04-01 NOTE — Patient Instructions (Signed)
Medication Instructions:  Your physician recommends that you continue on your current medications as directed. Please refer to the Current Medication list given to you today.  Labwork: Today for kidney function and BNP  Testing/Procedures: Your physician has requested that you have an echocardiogram. Echocardiography is a painless test that uses sound waves to create images of your heart. It provides your doctor with information about the size and shape of your heart and how well your heart's chambers and valves are working. This procedure takes approximately one hour. There are no restrictions for this procedure.   Follow-Up: Your physician wants you to follow-up in: 6 month with PA. You will receive a reminder letter in the mail two months in advance. If you don't receive a letter, please call our office to schedule the follow-up appointment.  Your physician wants you to follow-up in: 1 year with Dr. Radford Pax. You will receive a reminder letter in the mail two months in advance. If you don't receive a letter, please call our office to schedule the follow-up appointment.   Any Other Special Instructions Will Be Listed Below (If Applicable).    Thank you for choosing [facility], RN  [phone]  If you need a refill on your cardiac medications before your next appointment, please call your pharmacy.

## 2017-04-01 NOTE — Progress Notes (Signed)
Cardiology Office Note:    Date:  04/01/2017   ID:  [name], DOB 10-18-1940, MRN [id number]  PCP:  [name], DO  Cardiologist:  No primary care provider on file.    Referring MD: [name], DO   Chief Complaint  Patient presents with  . Congestive Heart Failure  . Hypertension    History of Present Illness:    [name] is a 77 y.o. female with a hx of Osler-weber-rendu disase, gastric AVMs with chronic iron deficiency anemia requiring frequent transfusions, HTN, chronic diastolic CHF, dyslipidemia, type II DM and GERD. 2D echo 09/2016 showed normal LVF with mild LVH and G2DD and moderate PHTN with PASP 74mHg.    She is here today for followup and is doing well.  She denies any chest pain or pressure, PND, orthopnea, LE edema, dizziness, palpitations or syncope. She has chronic DOE which she thinks is stable.  She has gained some weight and feels like it is in her upper abdomen. She is compliant with her meds and is tolerating meds with no SE.     Past Medical History:  Diagnosis Date  . Chronic anemia   . Chronic diastolic CH[place] (congestive heart failure) ([place] 10/03/2013  . Chronic GI bleeding    /[name]10/20/2016  . Family history of anesthesia complication    "niece has a hard time coming out" (09/15/2012)  . Frequent nosebleeds    chronic  . Gastric AV malformation    /[name]10/20/2016  . GERD (gastroesophageal reflux disease)   . 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 History of blood transfusion "several"  . History of epistaxis   . HTN (hypertension), benign 03/02/2012  . Hyperlipidemia   . Iron deficiency anemia    chronic infusions"  . Lichen planus    Both lower extremities  . 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[place]    /[name]10/20/2016  . Pneumonia 1990's X 2  . Pulmonary HTN ([place] 04/01/2017   PASP 564mg on echo 09/2016  . Seizures [place]  . Telangiectasia    Gastric   . Type II diabetes mellitus ([name]    insulin  requiring.    Past Surgical History:  Procedure Laterality Date  . CATARACT EXTRACTION     "I think it was just one eye"  . ESOPHAGOGASTRODUODENOSCOPY  02/26/2011   Procedure: ESOPHAGOGASTRODUODENOSCOPY (EGD);  Surgeon: [name];  [facility];  Service: Endoscopy;  Laterality: N/A;  . ESOPHAGOGASTRODUODENOSCOPY N/A 11/08/2012   Procedure: ESOPHAGOGASTRODUODENOSCOPY (EGD);  Surgeon: [name];  [facility];  Service: Endoscopy;  Laterality: N/A;  . ESOPHAGOGASTRODUODENOSCOPY N/A 10/04/2013   Procedure: ESOPHAGOGASTRODUODENOSCOPY (EGD);  Surgeon: [name];  [facility];  Service: Endoscopy;  Laterality: N/A;  with APC on stand-by  . ESOPHAGOGASTRODUODENOSCOPY N/A 07/06/2014   Procedure: ESOPHAGOGASTRODUODENOSCOPY (EGD);  Surgeon: [name];  [facility];  Service: Endoscopy;  Laterality: N/A;  . ESOPHAGOGASTRODUODENOSCOPY N/A 09/05/2014   Procedure: ESOPHAGOGASTRODUODENOSCOPY (EGD);  Surgeon: JaLaurence SpatesMD;  [facility];  Service: Endoscopy;  Laterality: N/A;  APC on standby to control bleeding  . ESOPHAGOGASTRODUODENOSCOPY N/A 11/29/2014   Procedure: ESOPHAGOGASTRODUODENOSCOPY (EGD);  Surgeon: [name];  [facility];  Service: Endoscopy;  Laterality: N/A;  . ESOPHAGOGASTRODUODENOSCOPY N/A 09/28/2015   Procedure: ESOPHAGOGASTRODUODENOSCOPY (EGD);  Surgeon: [name];  [facility];  Service: Endoscopy;  Laterality: N/A;  . ESOPHAGOGASTRODUODENOSCOPY ENDOSCOPY  08/19/2006   with laser  treatment  . HOT HEMOSTASIS N/A 07/06/2014   Procedure: HOT HEMOSTASIS (ARGON PLASMA COAGULATION/BICAP);  Surgeon: Clarene Essex, MD;  [name];  Service: Endoscopy;  Laterality: N/A;  . HOT HEMOSTASIS N/A 09/28/2015   Procedure: HOT HEMOSTASIS (ARGON PLASMA COAGULATION/BICAP);  Surgeon: Clarene Essex, MD;  [facility];  Service: Endoscopy;  Laterality: N/A;  . NASAL  HEMORRHAGE CONTROL     "for bleeding"   . SAVORY DILATION  02/26/2011   Procedure: SAVORY DILATION;  Surgeon: [name], MD;  Location: [name];  Service: Endoscopy;  Laterality: N/A;  c-arm needed    Current Medications: Current Meds  Medication Sig  . b complex vitamins capsule Take 1 capsule by mouth daily.  . Blood Glucose Monitoring Suppl (ONETOUCH VERIO) w/Device KIT   . calcium citrate-vitamin D (CITRACAL+D) 315-200 MG-UNIT tablet Take 1 tablet by mouth 2 (two) times daily.  . cetaphil (CETAPHIL) lotion Apply 1 application topically as needed for dry skin.  Marland Kitchen donepezil (ARICEPT) 10 MG tablet Take 1 tablet (10 mg total) by mouth at bedtime.  Water engineer Bandages & Supports (MEDICAL COMPRESSION STOCKINGS) MISC Wear as much as possible while awake to reduce swelling  . FERROCITE 324 MG TABS tablet TAKE ONE TABLET BY MOUTH TWICE DAILY  . furosemide (LASIX) 20 MG tablet Take 1 tablet (20 mg total) by mouth daily.  Marland Kitchen glucose blood (ONETOUCH VERIO) test strip Use as instructed 3-4 times daily. E11.29, insulin requiring  . Insulin Glargine (LANTUS) 100 UNIT/ML Solostar Pen Inject 16 Units into the skin 2 (two) times daily.  . insulin lispro (HUMALOG) 100 UNIT/ML KiwkPen Take 10 units before eating breakfast, lunch and dinner (3 times per day)  . Insulin Pen Needle 31G X 5 MM MISC Use to inject insulin  . lactulose (CHRONULAC) 10 GM/15ML solution Take 15 mLs (10 g total) by mouth 2 (two) times daily.  Marland Kitchen levETIRAcetam (KEPPRA) 500 MG tablet Take 1 tablet (500 mg total) by mouth 2 (two) times daily.  . memantine (NAMENDA) 10 MG tablet Take 1 tablet (10 mg total) by mouth 2 (two) times daily.  Glory Rosebush DELICA LANCETS FINE MISC Check blood sugar up to 3 times a day  . oxymetazoline (AFRIN) 0.05 % nasal spray Place 1 spray into both nostrils 2 (two) times daily as needed (Epistaxis).  . pantoprazole (PROTONIX) 40 MG tablet Take 1 tablet (40 mg total) by mouth 2 (two) times daily.  .  potassium chloride SA (KLOR-CON M20) 20 MEQ tablet TAKE 1 TABLET BY MOUTH ONCE DAILY  . pramipexole (MIRAPEX) 0.125 MG tablet TAKE ONE TABLET BY MOUTH ONCE DAILY  . sodium chloride (OCEAN) 0.65 % SOLN nasal spray Place 1 spray into both nostrils as needed for congestion.     Allergies:   Aspirin   Social History   Socioeconomic History  . Marital status: Married    Spouse name: [name]  . Number of children: 0  . Years of education: 9  . Highest education level: None  Social Needs  . Financial resource strain: None  . Food insecurity - worry: None  . Food insecurity - inability: None  . Transportation needs - medical: None  . Transportation needs - non-medical: None  Occupational History  . Occupation: Retired Systems developer: RETIRED  Tobacco Use  . Smoking status: Former Smoker    Packs/day: 1.00    Years: 20.00    Pack years: 20.00    Types: Cigarettes    Last  attempt to quit: 02/10/1971    Years since quitting: 46.1  . Smokeless tobacco: Never Used  . Tobacco comment: 09/15/2012 "smoked 50-60 yr ago"  Substance and Sexual Activity  . Alcohol use: No    Alcohol/week: 0.0 oz  . Drug use: No  . Sexual activity: None  Other Topics Concern  . None  Social History Narrative   Married, lives with husband, [name].  Ambulates without assistance.     Caffeine use: none     Family History: The patient's family history includes Breast cancer in her unknown relative; Stroke in her father. There is no history of Malignant hyperthermia or Seizures.  ROS:   Please see the history of present illness.    ROS  All other systems reviewed and negative.   EKGs/Labs/Other Studies Reviewed:    The following studies were reviewed today: 2D echo 09/2016 Study Conclusions  - Left ventricle: The cavity size was normal. Wall thickness was   increased in a pattern of mild LVH. There was focal basal   hypertrophy. Systolic function was normal. The estimated ejection   fraction  was in the range of 60% to 65%. Wall motion was normal;   there were no regional wall motion abnormalities. Features are   consistent with a pseudonormal left ventricular filling pattern,   with concomitant abnormal relaxation and increased filling   pressure (grade 2 diastolic dysfunction). - Aortic valve: Mildly calcified annulus. Mildly thickened,   moderately calcified leaflets. - Right atrium: The atrium was mildly dilated. - Pulmonary arteries: Systolic pressure was moderately increased.   PA peak pressure: 57 mm Hg (S).   EKG:  EKG is not ordered today.   Recent Labs: 10/26/2016: BUN 22; Creatinine, Ser 1.37; Magnesium 1.8; Potassium 3.6; Sodium 141 03/29/2017: Hemoglobin 8.3; Platelets 165   Recent Lipid Panel    Component Value Date/Time   CHOL 141 11/29/2014 1026   TRIG 141 11/29/2014 1026   HDL 31 (L) 11/29/2014 1026   CHOLHDL 4.5 11/29/2014 1026   VLDL 28 11/29/2014 1026   LDLCALC 82 11/29/2014 1026    Physical Exam:    VS:  BP (!) 128/54   Pulse 75   Ht '5\' 3"'$  (1.6 m)   Wt 165 lb 9.6 oz (75.1 kg)   LMP  (LMP Unknown)   SpO2 94%   BMI 29.33 kg/m     Wt Readings from Last 3 Encounters:  04/01/17 165 lb 9.6 oz (75.1 kg)  03/31/17 160 lb (72.6 kg)  03/18/17 164 lb 9.6 oz (74.7 kg)     GEN:  Well nourished, well developed in no acute distress HEENT: Normal NECK: No JVD; No carotid bruits LYMPHATICS: No lymphadenopathy CARDIAC: RRR, no  rubs, gallops.  2/56 SM at RUSB to LLSB RESPIRATORY:  Clear to auscultation without rales, wheezing or rhonchi  ABDOMEN: Soft, non-tender, non-distended MUSCULOSKELETAL:  No edema; No deformity  SKIN: Warm and dry NEUROLOGIC:  Alert and oriented x 3 PSYCHIATRIC:  Normal affect   ASSESSMENT:    1. Chronic diastolic CHF (congestive heart failure) ([place])   2. HTN (hypertension), benign   3. Pulmonary HTN ([place])   4. Heart murmur    PLAN:    In order of problems listed above:  1.  Chronic diastolic CHF - she appears  euvolemic on exam today but her weight is up 7lbs and she says that she feels weight gain in her upper abdomen.  I will check a BMET and BNP to make sure she is  not volume overloaded.    She will continue on lasix '20mg'$  daily for now until we get her BNP back.   2.  HTN - her BP is well controlled on exam today.  She is currently not on antihypertensive therapy.  3.  Pulmonary HTN - PASP was 1mHg by echo 09/2016 - I will repeat echo to reassess and make sure this is stable.  Likely related to pulmonary venous HTN from CHF.    4.  Heart murmur - echo 6 months ago showed no evidence of outflow tract gradient which had been seen before.  She does have moderate AVSC   Medication Adjustments/Labs and Tests Ordered: Current medicines are reviewed at length with the patient today.  Concerns regarding medicines are outlined above.  No orders of the defined types were placed in this encounter.  No orders of the defined types were placed in this encounter.   Signed, TFransico Him MD  04/01/2017 9:40 AM    [place]

## 2017-04-05 ENCOUNTER — Telehealth: Payer: Self-pay | Admitting: *Deleted

## 2017-04-05 ENCOUNTER — Other Ambulatory Visit: Payer: Self-pay | Admitting: Oncology

## 2017-04-05 ENCOUNTER — Other Ambulatory Visit (INDEPENDENT_AMBULATORY_CARE_PROVIDER_SITE_OTHER): Payer: Medicare Other

## 2017-04-05 DIAGNOSIS — K31819 Angiodysplasia of stomach and duodenum without bleeding: Secondary | ICD-10-CM

## 2017-04-05 DIAGNOSIS — I780 Hereditary hemorrhagic telangiectasia: Secondary | ICD-10-CM

## 2017-04-05 DIAGNOSIS — K31811 Angiodysplasia of stomach and duodenum with bleeding: Secondary | ICD-10-CM

## 2017-04-05 DIAGNOSIS — D500 Iron deficiency anemia secondary to blood loss (chronic): Secondary | ICD-10-CM

## 2017-04-05 LAB — CBC WITH DIFFERENTIAL/PLATELET
Basophils Absolute: 0 10*3/uL (ref 0.0–0.1)
Basophils Relative: 0 %
EOS PCT: 6 %
Eosinophils Absolute: 0.3 10*3/uL (ref 0.0–0.7)
HCT: 24 % — ABNORMAL LOW (ref 36.0–46.0)
Hemoglobin: 7.1 g/dL — ABNORMAL LOW (ref 12.0–15.0)
LYMPHS ABS: 0.7 10*3/uL (ref 0.7–4.0)
Lymphocytes Relative: 13 %
MCH: 32.1 pg (ref 26.0–34.0)
MCHC: 29.6 g/dL — ABNORMAL LOW (ref 30.0–36.0)
MCV: 108.6 fL — AB (ref 78.0–100.0)
MONO ABS: 0.1 10*3/uL (ref 0.1–1.0)
Monocytes Relative: 2 %
NEUTROS PCT: 79 %
Neutro Abs: 4.2 10*3/uL (ref 1.7–7.7)
PLATELETS: 163 10*3/uL (ref 150–400)
RBC: 2.21 MIL/uL — AB (ref 3.87–5.11)
RDW: 21.4 % — ABNORMAL HIGH (ref 11.5–15.5)
WBC: 5.3 10*3/uL (ref 4.0–10.5)

## 2017-04-05 LAB — PREPARE RBC (CROSSMATCH)

## 2017-04-05 LAB — SAMPLE TO BLOOD BANK

## 2017-04-05 NOTE — Telephone Encounter (Signed)
Thanks I put in orders 

## 2017-04-05 NOTE — Telephone Encounter (Signed)
-----   Message from [name], MD sent at 04/05/2017 12:17 PM EST ----- Please set her up for a 2 unit RBC transfusion

## 2017-04-05 NOTE — Addendum Note (Signed)
Addended by: Ebbie Latus on: 04/05/2017 01:40 PM   Modules accepted: Orders

## 2017-04-05 NOTE — Telephone Encounter (Signed)
Called pt - talked to [name], informed Hgb down @ 7.1 and [name] will need blood transfusion which will be tomorrow at [place] @ 0800 am. Voiced understanding.

## 2017-04-06 ENCOUNTER — Encounter (HOSPITAL_COMMUNITY)
Admission: RE | Admit: 2017-04-06 | Discharge: 2017-04-06 | Disposition: A | Discharge status: Home / Self Care | Payer: Medicare Other | Source: Ambulatory Visit | Attending: Oncology | Admitting: Oncology

## 2017-04-06 DIAGNOSIS — D500 Iron deficiency anemia secondary to blood loss (chronic): Secondary | ICD-10-CM | POA: Insufficient documentation

## 2017-04-06 DIAGNOSIS — K31819 Angiodysplasia of stomach and duodenum without bleeding: Secondary | ICD-10-CM

## 2017-04-06 DIAGNOSIS — I780 Hereditary hemorrhagic telangiectasia: Secondary | ICD-10-CM | POA: Insufficient documentation

## 2017-04-06 DIAGNOSIS — Q2733 Arteriovenous malformation of digestive system vessel: Secondary | ICD-10-CM | POA: Diagnosis present

## 2017-04-06 MED ORDER — SODIUM CHLORIDE 0.9 % IV SOLN
510.0000 mg | INTRAVENOUS | Status: DC
  Filled 2017-04-06: qty 17

## 2017-04-06 MED ORDER — HEPARIN SOD (PORK) LOCK FLUSH 100 UNIT/ML IV SOLN
INTRAVENOUS | Status: AC
  Administered 2017-04-06: 09:00:00 500 [IU]
  Filled 2017-04-06: qty 5

## 2017-04-06 MED ORDER — SODIUM CHLORIDE 0.9 % IV SOLN
Freq: Once | INTRAVENOUS | Status: AC
  Administered 2017-04-06: 09:00:00 via INTRAVENOUS

## 2017-04-06 MED ORDER — SODIUM CHLORIDE 0.9 % IV SOLN
510.0000 mg | INTRAVENOUS | Status: DC
  Administered 2017-04-06: 08:00:00 510 mg via INTRAVENOUS
  Filled 2017-04-06: qty 17

## 2017-04-07 ENCOUNTER — Other Ambulatory Visit: Payer: Self-pay

## 2017-04-07 ENCOUNTER — Telehealth (HOSPITAL_COMMUNITY): Payer: Self-pay | Admitting: Cardiology

## 2017-04-07 ENCOUNTER — Ambulatory Visit (HOSPITAL_COMMUNITY): Payer: Medicare Other | Attending: Cardiovascular Disease

## 2017-04-07 DIAGNOSIS — E785 Hyperlipidemia, unspecified: Secondary | ICD-10-CM | POA: Insufficient documentation

## 2017-04-07 DIAGNOSIS — E119 Type 2 diabetes mellitus without complications: Secondary | ICD-10-CM | POA: Insufficient documentation

## 2017-04-07 DIAGNOSIS — I272 Pulmonary hypertension, unspecified: Secondary | ICD-10-CM

## 2017-04-07 DIAGNOSIS — I071 Rheumatic tricuspid insufficiency: Secondary | ICD-10-CM | POA: Diagnosis not present

## 2017-04-07 DIAGNOSIS — I10 Essential (primary) hypertension: Secondary | ICD-10-CM | POA: Diagnosis not present

## 2017-04-07 LAB — TYPE AND SCREEN
ABO/RH(D): A POS
Antibody Screen: NEGATIVE
UNIT DIVISION: 0
UNIT DIVISION: 0

## 2017-04-07 LAB — BPAM RBC
BLOOD PRODUCT EXPIRATION DATE: 201903142359
Blood Product Expiration Date: 201903092359
ISSUE DATE / TIME: 201902261105
ISSUE DATE / TIME: 201902260845
Unit Type and Rh: 6200
Unit Type and Rh: 6200

## 2017-04-08 NOTE — Telephone Encounter (Signed)
User: Cherie Dark A Date/time: 04/07/17 8:39 AM  Comment: *CALL WENT STRAIGHT TO VM* I lmsg for her to CB to r/s echo due to tech out with the flu. *[phone] #* did not have a VM set up.  Context:  Outcome: Left Message  Phone number: [phone] Phone Type: Mobile  Comm. type: Telephone Call type: Outgoing  Contact: Kumari, Shonya L Relation to patient: Self

## 2017-04-12 ENCOUNTER — Other Ambulatory Visit (INDEPENDENT_AMBULATORY_CARE_PROVIDER_SITE_OTHER): Payer: Medicare Other

## 2017-04-12 ENCOUNTER — Telehealth: Payer: Self-pay

## 2017-04-12 ENCOUNTER — Telehealth: Payer: Self-pay | Admitting: *Deleted

## 2017-04-12 DIAGNOSIS — K31811 Angiodysplasia of stomach and duodenum with bleeding: Secondary | ICD-10-CM

## 2017-04-12 DIAGNOSIS — I780 Hereditary hemorrhagic telangiectasia: Secondary | ICD-10-CM | POA: Diagnosis not present

## 2017-04-12 DIAGNOSIS — D500 Iron deficiency anemia secondary to blood loss (chronic): Secondary | ICD-10-CM | POA: Diagnosis not present

## 2017-04-12 DIAGNOSIS — K5521 Angiodysplasia of colon with hemorrhage: Secondary | ICD-10-CM

## 2017-04-12 DIAGNOSIS — E875 Hyperkalemia: Secondary | ICD-10-CM

## 2017-04-12 DIAGNOSIS — K31819 Angiodysplasia of stomach and duodenum without bleeding: Secondary | ICD-10-CM

## 2017-04-12 LAB — CBC WITH DIFFERENTIAL/PLATELET
BASOS PCT: 1 %
Basophils Absolute: 0 10*3/uL (ref 0.0–0.1)
EOS ABS: 0.2 10*3/uL (ref 0.0–0.7)
Eosinophils Relative: 6 %
HEMATOCRIT: 30.8 % — AB (ref 36.0–46.0)
HEMOGLOBIN: 9.5 g/dL — AB (ref 12.0–15.0)
Lymphocytes Relative: 19 %
Lymphs Abs: 0.8 10*3/uL (ref 0.7–4.0)
MCH: 31.8 pg (ref 26.0–34.0)
MCHC: 30.8 g/dL (ref 30.0–36.0)
MCV: 103 fL — AB (ref 78.0–100.0)
Monocytes Absolute: 0.2 10*3/uL (ref 0.1–1.0)
Monocytes Relative: 6 %
NEUTROS ABS: 2.9 10*3/uL (ref 1.7–7.7)
NEUTROS PCT: 70 %
Platelets: 147 10*3/uL — ABNORMAL LOW (ref 150–400)
RBC: 2.99 MIL/uL — AB (ref 3.87–5.11)
RDW: 20.4 % — AB (ref 11.5–15.5)
WBC: 4.2 10*3/uL (ref 4.0–10.5)

## 2017-04-12 LAB — SAMPLE TO BLOOD BANK

## 2017-04-12 MED ORDER — POTASSIUM CHLORIDE ER 10 MEQ PO TBCR: 10.0000 meq | EXTENDED_RELEASE_TABLET | Freq: Every day | ORAL | 11 refills | Status: DC

## 2017-04-12 NOTE — Telephone Encounter (Signed)
Called pt - talked to pt's husband - informed " blood count good (@ 9.5). No blood this week " per Dr [name]. Stated ok, he will inform pt.

## 2017-04-12 NOTE — Telephone Encounter (Signed)
-----   Message from [name], MD sent at 04/12/2017 12:32 PM EST ----- Call pt: blood count good. No blood this week

## 2017-04-12 NOTE — Addendum Note (Signed)
Addended by: Teressa Senter on: 04/12/2017 01:17 PM   Modules accepted: Orders

## 2017-04-12 NOTE — Telephone Encounter (Signed)
Notes recorded by Teressa Senter, RN on 04/12/2017 at 1:13 PM EST Patient made aware of lab results. Instructed patient to DECREASE kdur to 10 meq once a day. Patient scheduled for repeat bmet on 04/21/17. Patient in agreement with plan and thankful for the call.  Notes recorded by [name], MD on 04/01/2017 at 5:00 PM EST K+ on the higher end of normal - decrease Kdur to 41meq daily and repeat BMET in 1 week

## 2017-04-19 ENCOUNTER — Telehealth: Payer: Self-pay | Admitting: *Deleted

## 2017-04-19 ENCOUNTER — Other Ambulatory Visit (INDEPENDENT_AMBULATORY_CARE_PROVIDER_SITE_OTHER): Payer: Medicare Other

## 2017-04-19 DIAGNOSIS — I780 Hereditary hemorrhagic telangiectasia: Secondary | ICD-10-CM

## 2017-04-19 DIAGNOSIS — D500 Iron deficiency anemia secondary to blood loss (chronic): Secondary | ICD-10-CM | POA: Diagnosis not present

## 2017-04-19 DIAGNOSIS — K31811 Angiodysplasia of stomach and duodenum with bleeding: Secondary | ICD-10-CM

## 2017-04-19 DIAGNOSIS — K31819 Angiodysplasia of stomach and duodenum without bleeding: Secondary | ICD-10-CM

## 2017-04-19 LAB — CBC WITH DIFFERENTIAL/PLATELET
BASOS ABS: 0 10*3/uL (ref 0.0–0.1)
BASOS PCT: 1 %
EOS ABS: 0.3 10*3/uL (ref 0.0–0.7)
Eosinophils Relative: 9 %
HEMATOCRIT: 29.7 % — AB (ref 36.0–46.0)
Hemoglobin: 8.9 g/dL — ABNORMAL LOW (ref 12.0–15.0)
Lymphocytes Relative: 13 %
Lymphs Abs: 0.5 10*3/uL — ABNORMAL LOW (ref 0.7–4.0)
MCH: 30.5 pg (ref 26.0–34.0)
MCHC: 30 g/dL (ref 30.0–36.0)
MCV: 101.7 fL — ABNORMAL HIGH (ref 78.0–100.0)
MONO ABS: 0.3 10*3/uL (ref 0.1–1.0)
Monocytes Relative: 8 %
NEUTROS ABS: 2.6 10*3/uL (ref 1.7–7.7)
NEUTROS PCT: 69 %
Platelets: 215 10*3/uL (ref 150–400)
RBC: 2.92 MIL/uL — ABNORMAL LOW (ref 3.87–5.11)
RDW: 17.3 % — AB (ref 11.5–15.5)
WBC: 3.7 10*3/uL — ABNORMAL LOW (ref 4.0–10.5)

## 2017-04-19 LAB — FERRITIN: FERRITIN: 108 ng/mL (ref 11–307)

## 2017-04-19 LAB — SAMPLE TO BLOOD BANK

## 2017-04-19 NOTE — Telephone Encounter (Signed)
Called pt - informed "blood count OK (@ 8.9); no transfusion this week" per Dr [name].

## 2017-04-19 NOTE — Telephone Encounter (Signed)
-----   Message from [name], MD sent at 04/19/2017  1:30 PM EDT ----- Call pt: blood count OK; no transfusion this week

## 2017-04-21 ENCOUNTER — Other Ambulatory Visit: Payer: Medicare Other

## 2017-04-21 DIAGNOSIS — E875 Hyperkalemia: Secondary | ICD-10-CM

## 2017-04-22 ENCOUNTER — Telehealth: Payer: Self-pay | Admitting: Cardiology

## 2017-04-22 LAB — BASIC METABOLIC PANEL
BUN/Creatinine Ratio: 13 (ref 12–28)
BUN: 17 mg/dL (ref 8–27)
CALCIUM: 9.5 mg/dL (ref 8.7–10.3)
CO2: 24 mmol/L (ref 20–29)
CREATININE: 1.35 mg/dL — AB (ref 0.57–1.00)
Chloride: 106 mmol/L (ref 96–106)
GFR calc Af Amer: 44 mL/min/{1.73_m2} — ABNORMAL LOW (ref >59)
GFR calc non Af Amer: 38 mL/min/{1.73_m2} — ABNORMAL LOW (ref >59)
Glucose: 83 mg/dL (ref 65–99)
Potassium: 4.1 mmol/L (ref 3.5–5.2)
Sodium: 144 mmol/L (ref 134–144)

## 2017-04-22 NOTE — Telephone Encounter (Signed)
New message    Please call patient with lab results.

## 2017-04-26 ENCOUNTER — Telehealth: Payer: Self-pay | Admitting: *Deleted

## 2017-04-26 ENCOUNTER — Other Ambulatory Visit (INDEPENDENT_AMBULATORY_CARE_PROVIDER_SITE_OTHER): Payer: Medicare Other

## 2017-04-26 DIAGNOSIS — D500 Iron deficiency anemia secondary to blood loss (chronic): Secondary | ICD-10-CM

## 2017-04-26 DIAGNOSIS — K31811 Angiodysplasia of stomach and duodenum with bleeding: Secondary | ICD-10-CM | POA: Diagnosis not present

## 2017-04-26 DIAGNOSIS — I780 Hereditary hemorrhagic telangiectasia: Secondary | ICD-10-CM | POA: Diagnosis not present

## 2017-04-26 DIAGNOSIS — K31819 Angiodysplasia of stomach and duodenum without bleeding: Secondary | ICD-10-CM

## 2017-04-26 LAB — SAMPLE TO BLOOD BANK

## 2017-04-26 LAB — CBC WITH DIFFERENTIAL/PLATELET
BASOS ABS: 0 10*3/uL (ref 0.0–0.1)
BASOS PCT: 1 %
EOS PCT: 7 %
Eosinophils Absolute: 0.3 10*3/uL (ref 0.0–0.7)
HCT: 30.6 % — ABNORMAL LOW (ref 36.0–46.0)
Hemoglobin: 9.3 g/dL — ABNORMAL LOW (ref 12.0–15.0)
Lymphocytes Relative: 13 %
Lymphs Abs: 0.5 10*3/uL — ABNORMAL LOW (ref 0.7–4.0)
MCH: 30.9 pg (ref 26.0–34.0)
MCHC: 30.4 g/dL (ref 30.0–36.0)
MCV: 101.7 fL — AB (ref 78.0–100.0)
MONO ABS: 0.2 10*3/uL (ref 0.1–1.0)
MONOS PCT: 6 %
Neutro Abs: 3.1 10*3/uL (ref 1.7–7.7)
Neutrophils Relative %: 73 %
PLATELETS: 174 10*3/uL (ref 150–400)
RBC: 3.01 MIL/uL — ABNORMAL LOW (ref 3.87–5.11)
RDW: 15.8 % — AB (ref 11.5–15.5)
WBC: 4.2 10*3/uL (ref 4.0–10.5)

## 2017-04-26 NOTE — Telephone Encounter (Signed)
Called pt - talked to Mr Nordlund; informed "blood count OK (up to 9.3) - no blood this week " per Dr [name]. Stated he tell Mrs. Conklin and thank you for calling.

## 2017-04-26 NOTE — Telephone Encounter (Signed)
-----   Message from [name], MD sent at 04/26/2017 12:18 PM EDT ----- Call pt: blood count OK - no blood this week

## 2017-04-27 NOTE — Telephone Encounter (Signed)
Informed patient of normal lab results. Patient verbalized understanding and thankful for the call

## 2017-04-28 IMAGING — CR DG KNEE 1-2V PORT*L*
2 series · 2 of 2 positions shown · non-contrast
Comparison: 06/18/2004

CLINICAL DATA: Discomfort and swelling left knee.  No injury.

EXAM:
PORTABLE LEFT KNEE - 1-2 VIEW

[AP]
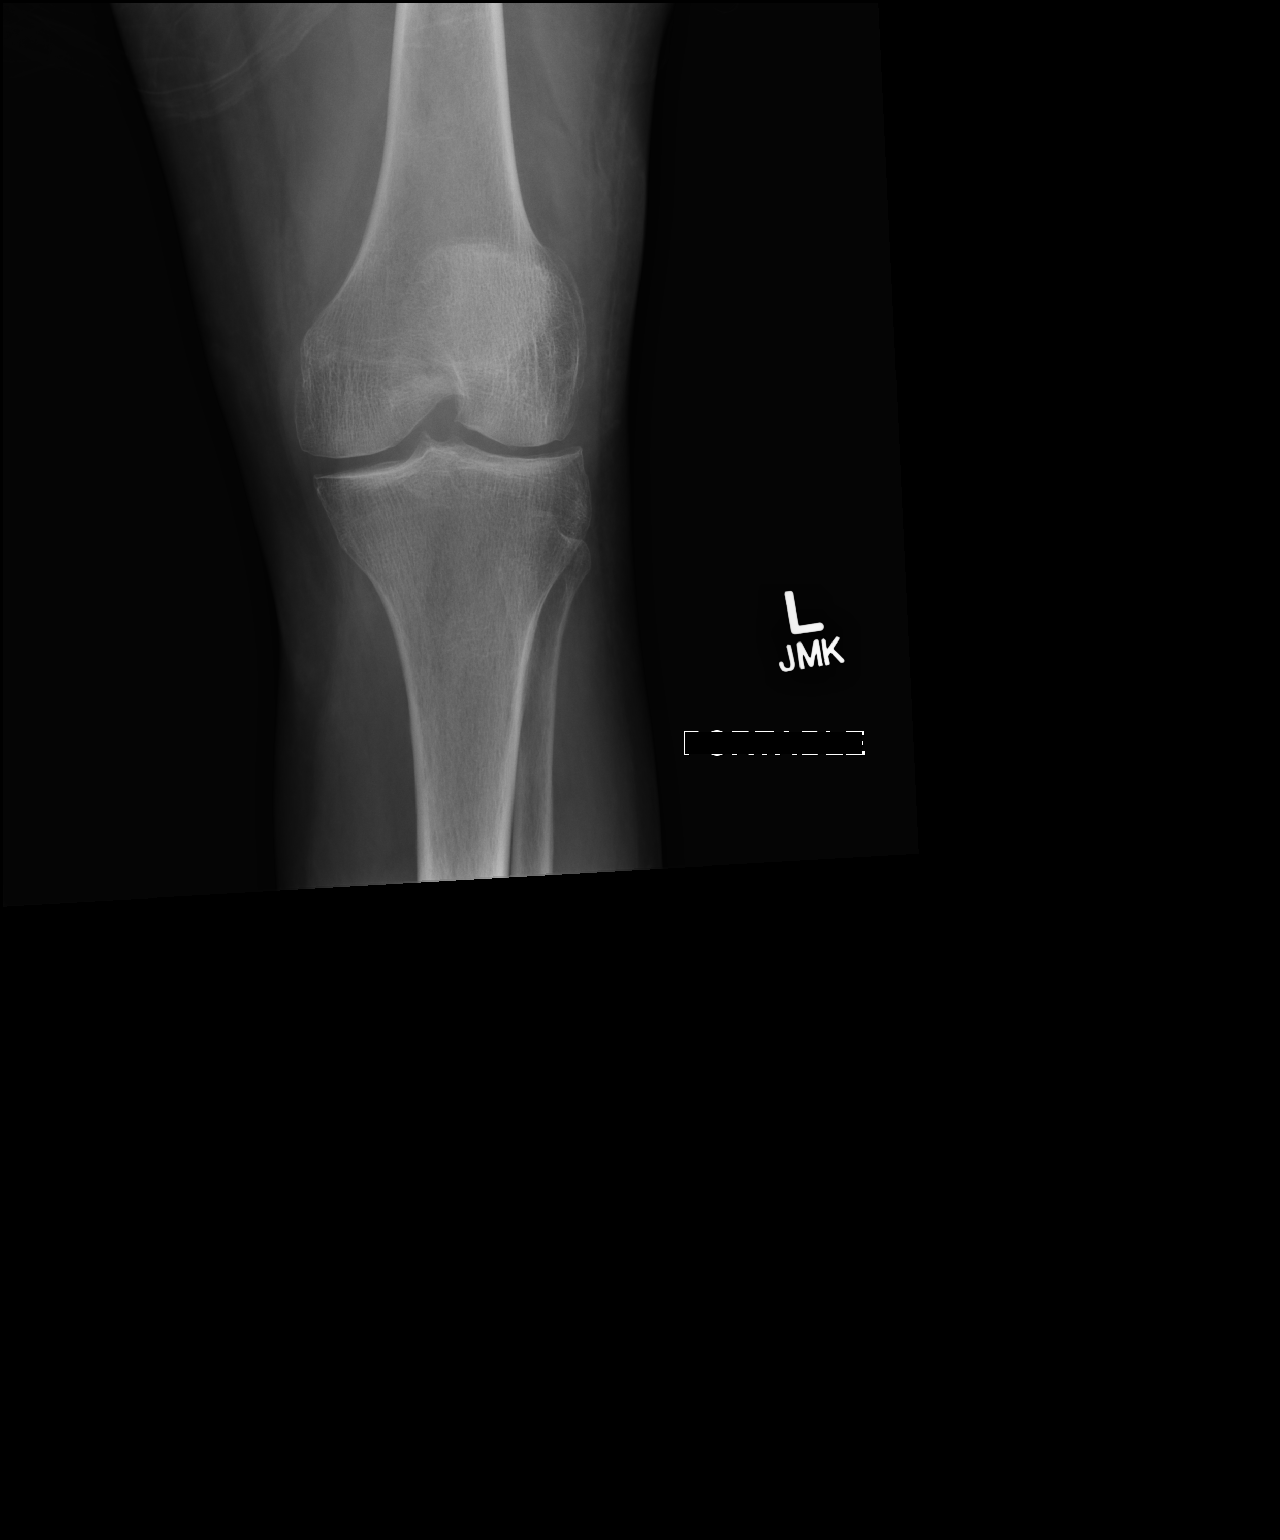

[lateral]
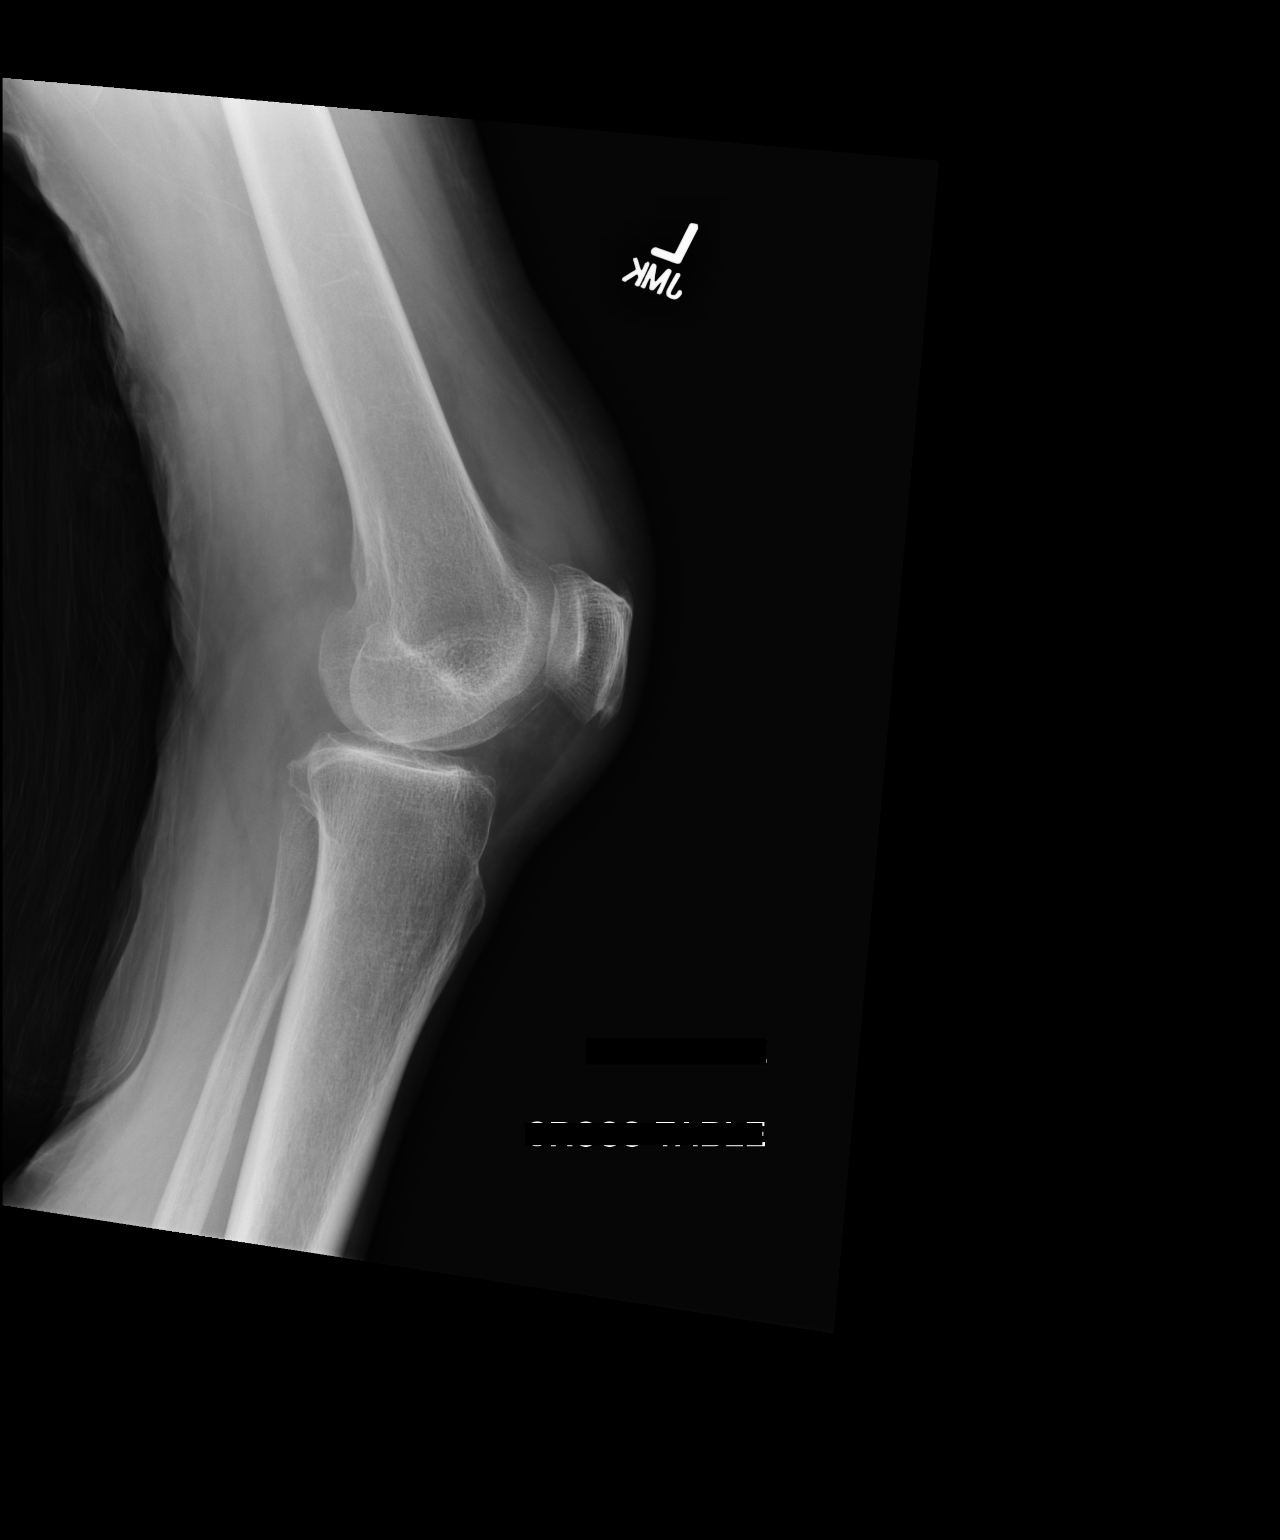

[2 of 2 positions shown; findings below may reference images not displayed]

FINDINGS: Evidence of mild tricompartmental osteoarthritic change most
prominent over the lateral compartment with slight progression.
Possible small joint effusion no fracture or dislocation.
IMPRESSION: No acute findings.

Mild osteoarthritic change most prominent over the lateral
compartment. Possible small joint effusion.

## 2017-05-03 ENCOUNTER — Other Ambulatory Visit (INDEPENDENT_AMBULATORY_CARE_PROVIDER_SITE_OTHER): Payer: Medicare Other

## 2017-05-03 ENCOUNTER — Other Ambulatory Visit: Payer: Self-pay | Admitting: Oncology

## 2017-05-03 DIAGNOSIS — K31811 Angiodysplasia of stomach and duodenum with bleeding: Secondary | ICD-10-CM | POA: Diagnosis not present

## 2017-05-03 DIAGNOSIS — I780 Hereditary hemorrhagic telangiectasia: Secondary | ICD-10-CM | POA: Diagnosis not present

## 2017-05-03 DIAGNOSIS — D500 Iron deficiency anemia secondary to blood loss (chronic): Secondary | ICD-10-CM

## 2017-05-03 DIAGNOSIS — K31819 Angiodysplasia of stomach and duodenum without bleeding: Secondary | ICD-10-CM

## 2017-05-03 LAB — CBC WITH DIFFERENTIAL/PLATELET
BASOS ABS: 0 10*3/uL (ref 0.0–0.1)
BASOS PCT: 0 %
EOS ABS: 0.5 10*3/uL (ref 0.0–0.7)
EOS PCT: 10 %
HCT: 26.5 % — ABNORMAL LOW (ref 36.0–46.0)
Hemoglobin: 8.3 g/dL — ABNORMAL LOW (ref 12.0–15.0)
LYMPHS PCT: 7 %
Lymphs Abs: 0.3 10*3/uL — ABNORMAL LOW (ref 0.7–4.0)
MCH: 31.8 pg (ref 26.0–34.0)
MCHC: 31.3 g/dL (ref 30.0–36.0)
MCV: 101.5 fL — ABNORMAL HIGH (ref 78.0–100.0)
Monocytes Absolute: 0.5 10*3/uL (ref 0.1–1.0)
Monocytes Relative: 11 %
Neutro Abs: 3.3 10*3/uL (ref 1.7–7.7)
Neutrophils Relative %: 72 %
PLATELETS: 164 10*3/uL (ref 150–400)
RBC: 2.61 MIL/uL — AB (ref 3.87–5.11)
RDW: 15.3 % (ref 11.5–15.5)
WBC: 4.6 10*3/uL (ref 4.0–10.5)

## 2017-05-03 LAB — SAMPLE TO BLOOD BANK

## 2017-05-03 NOTE — Addendum Note (Signed)
Addended by: Truddie Crumble on: 05/03/2017 01:44 PM   Modules accepted: Orders

## 2017-05-03 NOTE — Addendum Note (Signed)
Addended by: Truddie Crumble on: 05/03/2017 01:45 PM   Modules accepted: Orders

## 2017-05-04 ENCOUNTER — Telehealth: Payer: Self-pay | Admitting: *Deleted

## 2017-05-04 NOTE — Telephone Encounter (Signed)
-----   Message from [name], MD sent at 05/03/2017  4:27 PM EDT ----- Call pt: no blood this week - will need blood next week. Come in early on Monday 4/1

## 2017-05-04 NOTE — Telephone Encounter (Signed)
Talked to Mr Blauvelt yesterday - informed Mrs. Sowash's hgb down to 8.3 but no blood this week per Dr [name] ; come earlier on Monday - voiced understanding and will let her know.

## 2017-05-10 ENCOUNTER — Other Ambulatory Visit: Payer: Medicare Other

## 2017-05-10 ENCOUNTER — Other Ambulatory Visit (INDEPENDENT_AMBULATORY_CARE_PROVIDER_SITE_OTHER): Payer: Medicare Other

## 2017-05-10 ENCOUNTER — Telehealth: Payer: Self-pay | Admitting: *Deleted

## 2017-05-10 DIAGNOSIS — I780 Hereditary hemorrhagic telangiectasia: Secondary | ICD-10-CM | POA: Diagnosis not present

## 2017-05-10 DIAGNOSIS — D500 Iron deficiency anemia secondary to blood loss (chronic): Secondary | ICD-10-CM

## 2017-05-10 DIAGNOSIS — K31819 Angiodysplasia of stomach and duodenum without bleeding: Secondary | ICD-10-CM | POA: Diagnosis not present

## 2017-05-10 LAB — CBC WITH DIFFERENTIAL/PLATELET
BASOS PCT: 0 %
Basophils Absolute: 0 10*3/uL (ref 0.0–0.1)
Eosinophils Absolute: 0.4 10*3/uL (ref 0.0–0.7)
Eosinophils Relative: 8 %
HEMATOCRIT: 26.4 % — AB (ref 36.0–46.0)
Hemoglobin: 8.1 g/dL — ABNORMAL LOW (ref 12.0–15.0)
LYMPHS PCT: 12 %
Lymphs Abs: 0.6 10*3/uL — ABNORMAL LOW (ref 0.7–4.0)
MCH: 32.1 pg (ref 26.0–34.0)
MCHC: 30.7 g/dL (ref 30.0–36.0)
MCV: 104.8 fL — AB (ref 78.0–100.0)
Monocytes Absolute: 0.3 10*3/uL (ref 0.1–1.0)
Monocytes Relative: 7 %
NEUTROS ABS: 3.6 10*3/uL (ref 1.7–7.7)
Neutrophils Relative %: 73 %
PLATELETS: 177 10*3/uL (ref 150–400)
RBC: 2.52 MIL/uL — AB (ref 3.87–5.11)
RDW: 15.4 % (ref 11.5–15.5)
WBC: 4.9 10*3/uL (ref 4.0–10.5)

## 2017-05-10 LAB — SAMPLE TO BLOOD BANK

## 2017-05-10 NOTE — Telephone Encounter (Signed)
Called pt - talked to [name] - informed "Hb same as last week (@8 .1) - we can wait another week for transfusion" per Dr [name]. Stated he will tell Mrs. Briere and thanks for calling.

## 2017-05-10 NOTE — Telephone Encounter (Signed)
-----   Message from [name], MD sent at 05/10/2017  2:02 PM EDT ----- Hb same as last week - we can wait another week for transfusion

## 2017-05-17 ENCOUNTER — Other Ambulatory Visit: Payer: Self-pay | Admitting: Cardiology

## 2017-05-17 ENCOUNTER — Telehealth: Payer: Self-pay | Admitting: *Deleted

## 2017-05-17 ENCOUNTER — Other Ambulatory Visit: Payer: Self-pay | Admitting: Oncology

## 2017-05-17 ENCOUNTER — Other Ambulatory Visit (INDEPENDENT_AMBULATORY_CARE_PROVIDER_SITE_OTHER): Payer: Medicare Other

## 2017-05-17 DIAGNOSIS — D500 Iron deficiency anemia secondary to blood loss (chronic): Secondary | ICD-10-CM

## 2017-05-17 DIAGNOSIS — K31819 Angiodysplasia of stomach and duodenum without bleeding: Secondary | ICD-10-CM

## 2017-05-17 DIAGNOSIS — D649 Anemia, unspecified: Secondary | ICD-10-CM

## 2017-05-17 DIAGNOSIS — I780 Hereditary hemorrhagic telangiectasia: Secondary | ICD-10-CM

## 2017-05-17 LAB — CBC WITH DIFFERENTIAL/PLATELET
BASOS ABS: 0 10*3/uL (ref 0.0–0.1)
Basophils Relative: 0 %
EOS PCT: 9 %
Eosinophils Absolute: 0.4 10*3/uL (ref 0.0–0.7)
HCT: 25 % — ABNORMAL LOW (ref 36.0–46.0)
HEMOGLOBIN: 7.5 g/dL — AB (ref 12.0–15.0)
LYMPHS PCT: 12 %
Lymphs Abs: 0.6 10*3/uL — ABNORMAL LOW (ref 0.7–4.0)
MCH: 31.3 pg (ref 26.0–34.0)
MCHC: 30 g/dL (ref 30.0–36.0)
MCV: 104.2 fL — AB (ref 78.0–100.0)
Monocytes Absolute: 0.3 10*3/uL (ref 0.1–1.0)
Monocytes Relative: 7 %
NEUTROS ABS: 3.4 10*3/uL (ref 1.7–7.7)
Neutrophils Relative %: 72 %
PLATELETS: 189 10*3/uL (ref 150–400)
RBC: 2.4 MIL/uL — ABNORMAL LOW (ref 3.87–5.11)
RDW: 14.6 % (ref 11.5–15.5)
WBC: 4.7 10*3/uL (ref 4.0–10.5)

## 2017-05-17 LAB — SAMPLE TO BLOOD BANK

## 2017-05-17 NOTE — Telephone Encounter (Signed)
Called pt - talked to [name] ; informed Mrs Poss's  hgb down to 7.5 and needs 2 units of blood. Appt scheduled Wed 4/19 @ 0800 AM here at short stay. Stated ok.

## 2017-05-17 NOTE — Addendum Note (Signed)
Addended by: Ebbie Latus on: 05/17/2017 04:32 PM   Modules accepted: Orders

## 2017-05-17 NOTE — Telephone Encounter (Signed)
-----   Message from [name], MD sent at 05/17/2017 11:37 AM EDT ----- Let's set her up for 2 units of blood for this week please

## 2017-05-18 ENCOUNTER — Other Ambulatory Visit: Payer: Self-pay | Admitting: Oncology

## 2017-05-18 DIAGNOSIS — I780 Hereditary hemorrhagic telangiectasia: Secondary | ICD-10-CM

## 2017-05-18 DIAGNOSIS — K31819 Angiodysplasia of stomach and duodenum without bleeding: Secondary | ICD-10-CM

## 2017-05-18 DIAGNOSIS — D500 Iron deficiency anemia secondary to blood loss (chronic): Secondary | ICD-10-CM

## 2017-05-18 LAB — PREPARE RBC (CROSSMATCH)

## 2017-05-18 NOTE — Addendum Note (Signed)
Addended by: Ebbie Latus on: 05/18/2017 04:44 PM   Modules accepted: Orders

## 2017-05-18 NOTE — Addendum Note (Signed)
Addended by: Ebbie Latus on: 05/18/2017 04:24 PM   Modules accepted: Orders

## 2017-05-19 ENCOUNTER — Ambulatory Visit (HOSPITAL_COMMUNITY)
Admission: RE | Admit: 2017-05-19 | Discharge: 2017-05-19 | Disposition: A | Discharge status: Home / Self Care | Payer: Medicare Other | Source: Ambulatory Visit | Attending: Oncology | Admitting: Oncology

## 2017-05-19 DIAGNOSIS — I780 Hereditary hemorrhagic telangiectasia: Secondary | ICD-10-CM | POA: Diagnosis present

## 2017-05-19 DIAGNOSIS — D500 Iron deficiency anemia secondary to blood loss (chronic): Secondary | ICD-10-CM | POA: Diagnosis not present

## 2017-05-19 DIAGNOSIS — K31819 Angiodysplasia of stomach and duodenum without bleeding: Secondary | ICD-10-CM | POA: Insufficient documentation

## 2017-05-19 DIAGNOSIS — D649 Anemia, unspecified: Secondary | ICD-10-CM

## 2017-05-19 LAB — PREPARE RBC (CROSSMATCH)

## 2017-05-19 MED ORDER — SODIUM CHLORIDE 0.9 % IV SOLN: Freq: Once | INTRAVENOUS | Status: DC

## 2017-05-19 MED ORDER — HEPARIN SOD (PORK) LOCK FLUSH 100 UNIT/ML IV SOLN
INTRAVENOUS | Status: AC
  Administered 2017-05-19: 14:00:00 500 [IU]
  Filled 2017-05-19: qty 5

## 2017-05-20 LAB — BPAM RBC
Blood Product Expiration Date: 201904222359
Blood Product Expiration Date: 201904232359
ISSUE DATE / TIME: 201904100822
ISSUE DATE / TIME: 201904101111
Unit Type and Rh: 6200
Unit Type and Rh: 6200

## 2017-05-20 LAB — TYPE AND SCREEN
ABO/RH(D): A POS
Antibody Screen: NEGATIVE
UNIT DIVISION: 0
Unit division: 0

## 2017-05-24 ENCOUNTER — Other Ambulatory Visit (INDEPENDENT_AMBULATORY_CARE_PROVIDER_SITE_OTHER): Payer: Medicare Other

## 2017-05-24 DIAGNOSIS — D500 Iron deficiency anemia secondary to blood loss (chronic): Secondary | ICD-10-CM | POA: Diagnosis not present

## 2017-05-24 DIAGNOSIS — K31819 Angiodysplasia of stomach and duodenum without bleeding: Secondary | ICD-10-CM | POA: Diagnosis not present

## 2017-05-24 DIAGNOSIS — I780 Hereditary hemorrhagic telangiectasia: Secondary | ICD-10-CM

## 2017-05-24 LAB — CBC WITH DIFFERENTIAL/PLATELET
Basophils Absolute: 0 10*3/uL (ref 0.0–0.1)
Basophils Relative: 0 %
EOS PCT: 4 %
Eosinophils Absolute: 0.2 10*3/uL (ref 0.0–0.7)
HCT: 30 % — ABNORMAL LOW (ref 36.0–46.0)
HEMOGLOBIN: 9.5 g/dL — AB (ref 12.0–15.0)
LYMPHS ABS: 0.5 10*3/uL — AB (ref 0.7–4.0)
LYMPHS PCT: 12 %
MCH: 30.8 pg (ref 26.0–34.0)
MCHC: 31.7 g/dL (ref 30.0–36.0)
MCV: 97.4 fL (ref 78.0–100.0)
MONOS PCT: 7 %
Monocytes Absolute: 0.3 10*3/uL (ref 0.1–1.0)
Neutro Abs: 3.2 10*3/uL (ref 1.7–7.7)
Neutrophils Relative %: 77 %
PLATELETS: 160 10*3/uL (ref 150–400)
RBC: 3.08 MIL/uL — AB (ref 3.87–5.11)
RDW: 17 % — ABNORMAL HIGH (ref 11.5–15.5)
WBC: 4.2 10*3/uL (ref 4.0–10.5)

## 2017-05-24 LAB — SAMPLE TO BLOOD BANK

## 2017-05-25 ENCOUNTER — Telehealth: Payer: Self-pay | Admitting: *Deleted

## 2017-05-25 NOTE — Telephone Encounter (Signed)
Called pt - talked to [name] ; informed Mrs Serna's result  "Hb good. No blood this week" per Dr [name]. Stated thanks for calling.

## 2017-05-25 NOTE — Telephone Encounter (Signed)
-----   Message from [name], MD sent at 05/24/2017  2:43 PM EDT ----- Call pt: Hb good. No blood this week

## 2017-05-31 ENCOUNTER — Other Ambulatory Visit (INDEPENDENT_AMBULATORY_CARE_PROVIDER_SITE_OTHER): Payer: Medicare Other

## 2017-05-31 ENCOUNTER — Telehealth: Payer: Self-pay | Admitting: *Deleted

## 2017-05-31 DIAGNOSIS — I780 Hereditary hemorrhagic telangiectasia: Secondary | ICD-10-CM | POA: Diagnosis not present

## 2017-05-31 DIAGNOSIS — D500 Iron deficiency anemia secondary to blood loss (chronic): Secondary | ICD-10-CM | POA: Diagnosis not present

## 2017-05-31 DIAGNOSIS — K31819 Angiodysplasia of stomach and duodenum without bleeding: Secondary | ICD-10-CM

## 2017-05-31 LAB — CBC WITH DIFFERENTIAL/PLATELET
BAND NEUTROPHILS: 0 %
BASOS ABS: 0 10*3/uL (ref 0.0–0.1)
BASOS PCT: 0 %
Blasts: 0 %
EOS ABS: 0.4 10*3/uL (ref 0.0–0.7)
Eosinophils Relative: 6 %
HCT: 30.1 % — ABNORMAL LOW (ref 36.0–46.0)
Hemoglobin: 9.5 g/dL — ABNORMAL LOW (ref 12.0–15.0)
LYMPHS PCT: 8 %
Lymphs Abs: 0.5 10*3/uL — ABNORMAL LOW (ref 0.7–4.0)
MCH: 31.1 pg (ref 26.0–34.0)
MCHC: 31.6 g/dL (ref 30.0–36.0)
MCV: 98.7 fL (ref 78.0–100.0)
METAMYELOCYTES PCT: 0 %
MONO ABS: 0.3 10*3/uL (ref 0.1–1.0)
MONOS PCT: 5 %
Myelocytes: 0 %
NEUTROS ABS: 4.7 10*3/uL (ref 1.7–7.7)
Neutrophils Relative %: 81 %
OTHER: 0 %
PLATELETS: 197 10*3/uL (ref 150–400)
Promyelocytes Relative: 0 %
RBC: 3.05 MIL/uL — ABNORMAL LOW (ref 3.87–5.11)
RDW: 16.3 % — AB (ref 11.5–15.5)
WBC: 5.9 10*3/uL (ref 4.0–10.5)
nRBC: 0 /100 WBC

## 2017-05-31 LAB — SAMPLE TO BLOOD BANK

## 2017-05-31 LAB — FERRITIN: FERRITIN: 18 ng/mL (ref 11–307)

## 2017-05-31 NOTE — Telephone Encounter (Signed)
Talked to pt's husband - informed "count good. No blood this week " per Dr [name]. Stated he will let Mrs. Duckett know and thanks for calling.

## 2017-05-31 NOTE — Telephone Encounter (Signed)
-----   Message from [name], MD sent at 05/31/2017  1:09 PM EDT ----- Call pt: count good. No blood this week

## 2017-06-07 ENCOUNTER — Other Ambulatory Visit (INDEPENDENT_AMBULATORY_CARE_PROVIDER_SITE_OTHER): Payer: Medicare Other

## 2017-06-07 DIAGNOSIS — I780 Hereditary hemorrhagic telangiectasia: Secondary | ICD-10-CM

## 2017-06-07 DIAGNOSIS — D500 Iron deficiency anemia secondary to blood loss (chronic): Secondary | ICD-10-CM

## 2017-06-07 DIAGNOSIS — K31819 Angiodysplasia of stomach and duodenum without bleeding: Secondary | ICD-10-CM

## 2017-06-07 LAB — CBC WITH DIFFERENTIAL/PLATELET
Basophils Absolute: 0 10*3/uL (ref 0.0–0.1)
Basophils Relative: 0 %
Eosinophils Absolute: 0.3 10*3/uL (ref 0.0–0.7)
Eosinophils Relative: 6 %
HCT: 27.9 % — ABNORMAL LOW (ref 36.0–46.0)
HEMOGLOBIN: 8.4 g/dL — AB (ref 12.0–15.0)
LYMPHS ABS: 0.6 10*3/uL — AB (ref 0.7–4.0)
LYMPHS PCT: 11 %
MCH: 30.7 pg (ref 26.0–34.0)
MCHC: 30.1 g/dL (ref 30.0–36.0)
MCV: 101.8 fL — AB (ref 78.0–100.0)
Monocytes Absolute: 0.7 10*3/uL (ref 0.1–1.0)
Monocytes Relative: 13 %
NEUTROS PCT: 70 %
Neutro Abs: 3.5 10*3/uL (ref 1.7–7.7)
Platelets: 203 10*3/uL (ref 150–400)
RBC: 2.74 MIL/uL — AB (ref 3.87–5.11)
RDW: 16 % — ABNORMAL HIGH (ref 11.5–15.5)
WBC: 5.1 10*3/uL (ref 4.0–10.5)

## 2017-06-07 LAB — SAMPLE TO BLOOD BANK

## 2017-06-08 ENCOUNTER — Telehealth: Payer: Self-pay | Admitting: *Deleted

## 2017-06-08 NOTE — Telephone Encounter (Signed)
Called pt - talked to Mr Kitamura - informed "blood count down - OK to wait for blood transfusion until next week but will need blood then." per Dr [name]. Stated ok ; she has an appt to see Dr Darnell Level on Monday also.

## 2017-06-08 NOTE — Telephone Encounter (Signed)
-----   Message from [name], MD sent at 06/07/2017 12:33 PM EDT ----- Call pt: blood count down - OK to wait for blood transfusion until next week but will need blood then.

## 2017-06-14 ENCOUNTER — Ambulatory Visit (INDEPENDENT_AMBULATORY_CARE_PROVIDER_SITE_OTHER): Payer: Medicare Other | Admitting: Oncology

## 2017-06-14 ENCOUNTER — Other Ambulatory Visit: Payer: Self-pay

## 2017-06-14 ENCOUNTER — Encounter: Payer: Self-pay | Admitting: Oncology

## 2017-06-14 DIAGNOSIS — I110 Hypertensive heart disease with heart failure: Secondary | ICD-10-CM | POA: Diagnosis not present

## 2017-06-14 DIAGNOSIS — D500 Iron deficiency anemia secondary to blood loss (chronic): Secondary | ICD-10-CM

## 2017-06-14 DIAGNOSIS — K31819 Angiodysplasia of stomach and duodenum without bleeding: Secondary | ICD-10-CM | POA: Diagnosis not present

## 2017-06-14 DIAGNOSIS — I780 Hereditary hemorrhagic telangiectasia: Secondary | ICD-10-CM

## 2017-06-14 DIAGNOSIS — R011 Cardiac murmur, unspecified: Secondary | ICD-10-CM

## 2017-06-14 DIAGNOSIS — Z794 Long term (current) use of insulin: Secondary | ICD-10-CM

## 2017-06-14 DIAGNOSIS — I5032 Chronic diastolic (congestive) heart failure: Secondary | ICD-10-CM

## 2017-06-14 DIAGNOSIS — G8929 Other chronic pain: Secondary | ICD-10-CM | POA: Diagnosis not present

## 2017-06-14 DIAGNOSIS — Z9889 Other specified postprocedural states: Secondary | ICD-10-CM | POA: Diagnosis not present

## 2017-06-14 DIAGNOSIS — I081 Rheumatic disorders of both mitral and tricuspid valves: Secondary | ICD-10-CM | POA: Diagnosis not present

## 2017-06-14 DIAGNOSIS — Z886 Allergy status to analgesic agent status: Secondary | ICD-10-CM

## 2017-06-14 DIAGNOSIS — E1151 Type 2 diabetes mellitus with diabetic peripheral angiopathy without gangrene: Secondary | ICD-10-CM | POA: Diagnosis not present

## 2017-06-14 DIAGNOSIS — M79662 Pain in left lower leg: Secondary | ICD-10-CM

## 2017-06-14 DIAGNOSIS — D1801 Hemangioma of skin and subcutaneous tissue: Secondary | ICD-10-CM

## 2017-06-14 LAB — CBC WITH DIFFERENTIAL/PLATELET
BASOS ABS: 0 10*3/uL (ref 0.0–0.1)
Basophils Relative: 0 %
EOS ABS: 0.3 10*3/uL (ref 0.0–0.7)
Eosinophils Relative: 7 %
HEMATOCRIT: 28.3 % — AB (ref 36.0–46.0)
HEMOGLOBIN: 8.7 g/dL — AB (ref 12.0–15.0)
Lymphocytes Relative: 10 %
Lymphs Abs: 0.4 10*3/uL — ABNORMAL LOW (ref 0.7–4.0)
MCH: 30.7 pg (ref 26.0–34.0)
MCHC: 30.7 g/dL (ref 30.0–36.0)
MCV: 100 fL (ref 78.0–100.0)
Monocytes Absolute: 0.4 10*3/uL (ref 0.1–1.0)
Monocytes Relative: 8 %
NEUTROS ABS: 3.2 10*3/uL (ref 1.7–7.7)
NEUTROS PCT: 75 %
Platelets: 202 10*3/uL (ref 150–400)
RBC: 2.83 MIL/uL — ABNORMAL LOW (ref 3.87–5.11)
RDW: 14.6 % (ref 11.5–15.5)
WBC: 4.4 10*3/uL (ref 4.0–10.5)

## 2017-06-14 LAB — SAMPLE TO BLOOD BANK

## 2017-06-14 NOTE — Patient Instructions (Addendum)
Continue cbc blood count every week MD visit with Dr Darnell Level in 6 months

## 2017-06-14 NOTE — Progress Notes (Signed)
Hematology and Oncology Follow Up Visit  [name] [id number] 1940/08/23 77 y.o. 06/14/2017 2:08 PM   Principle Diagnosis: Encounter Diagnoses  Name Primary?  . 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 Anemia due to chronic blood loss   . 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 She also has intermittent epistaxis. She requires frequent transfusions and parenteral iron supplementation. She does not tolerate oral iron. Transfusion requirements are becoming more frequent.  She last required hospital admission for urgent blood transfusion on March 02, 2016. The last time she required laser ablation was in August 2017. Only a single nonbleeding lesion was fulgurated at that time. Previous upper endoscopy on October 04, 2013 by Dr. Laurence Spates. A number of her gastric AVMs were actively bleeding. Some were cauterized. The largest was clipped. Despite laser fulguration and clipping of gastric AVMs, she continues to lose blood on a continuous basis. She had an episode of hematemesis and was admitted on Jul 05, 2014. A number of AVMs and previous scars from laser procedures were seen but no active bleeding at time of upper endoscopy on May 27. She had a another procedure on September 05, 2014. A large AVM along the greater curvature of the stomach was bleeding and required laser therapy.  She continues to be transfusion dependent. I alternate parenteral iron with blood to try to keep her stable. It is an ongoing challenge.Bleeding is very sporadic but she usually winds up having to have a blood transfusion every 2-3 weeks.  Interim History: Overall doing well.  She still gets intermittent epistaxis.  Chronically dark stools.  She occasionally coughs up blood but this is likely what she expectorates from  swallowed nasal mucosal bleeding. It is always a guessing game when her hemoglobin is around 8 g whether or not to give her blood.  I have her on  weekly CBC monitoring.  Have been trying to wait until hemoglobin is less than 8 g to transfuse given problems with developing antibodies.  Iron overload never a problem for her given chronic GI blood loss.  She was in to see her cardiologist in February.  She has chronic grade 2 diastolic dysfunction.  A follow-up echocardiogram was done on April 07, 2017 which continues to show preserved left ventricular function with estimated ejection fraction 65-70%.  Persistent grade 2 diastolic dysfunction and secondary pulmonary hypertension with peak pulmonary pressure 45 mm.  Despite her aortic systolic murmur, aortic valve showed no stenosis or regurgitation.  Trivial mitral regurgitation.  Mild tricuspid regurgitation.  No change in her chronic lower extremity leg pain.  Pain is atypical.  She has been worked up for DVTs which have been negative.  She did not respond to gabapentin or muscle relaxants.  Medications: reviewed  Allergies:  Allergies  Allergen Reactions  . Aspirin Nausea And Vomiting    Review of Systems: See interim history Remaining ROS negative:   Physical Exam: Blood pressure (!) 127/35, pulse 60, temperature 98 F (36.7 C), temperature source Oral, height 5\' 3"  (1.6 m), weight 164 lb (74.4 kg), SpO2 96 %. Wt Readings from Last 3 Encounters:  06/14/17 164 lb (74.4 kg)  05/19/17 160 lb (72.6 kg)  04/06/17 160 lb (72.6 kg)     General appearance: Well-nourished African-American woman wearing a wig. HENNT: Pharynx no erythema, exudate, mass, or ulcer.  I did not  appreciate any hemangiomas in the oral mucosa at this time.  No thyromegaly or thyroid nodules Lymph nodes: No cervical, supraclavicular, or axillary lymphadenopathy Breasts:  Lungs: Clear to auscultation, resonant to percussion throughout Heart: Regular rhythm, 2/6  aortic systolic murmur, no gallop, no rub, no click, trace ankle edema Abdomen: Soft, nontender, normal bowel sounds, no mass, no organomegaly Extremities: No edema, no calf tenderness.   Musculoskeletal: no joint deformities GU:  Vascular: Carotid pulses 2+, no bruits,  Neurologic: Alert, oriented, PERRLA, optic disks sharp, no hemorrhage or exudate, cranial nerves grossly normal, motor strength 5 over 5, reflexes 1+ symmetric, upper body coordination normal, gait normal, Skin: No rash or ecchymosis.  Small punctate hemangiomas on some of her fingertips.  Lab Results: CBC W/Diff    Component Value Date/Time   WBC 4.4 06/14/2017 1120   RBC 2.83 (L) 06/14/2017 1120   HGB 8.7 (L) 06/14/2017 1120   HGB 9.7 (L) 03/13/2015 1059   HCT 28.3 (L) 06/14/2017 1120   HCT 29.5 (L) 03/13/2015 1059   PLT 202 06/14/2017 1120   PLT 140 (L) 03/13/2015 1059   PLT 237 11/09/2014 1547   MCV 100.0 06/14/2017 1120   MCV 85.5 03/13/2015 1059   MCH 30.7 06/14/2017 1120   MCHC 30.7 06/14/2017 1120   RDW 14.6 06/14/2017 1120   RDW 21.7 (H) 03/13/2015 1059   LYMPHSABS 0.4 (L) 06/14/2017 1120   LYMPHSABS 0.4 (L) 03/13/2015 1059   MONOABS 0.4 06/14/2017 1120   MONOABS 0.3 03/13/2015 1059   EOSABS 0.3 06/14/2017 1120   EOSABS 0.3 03/13/2015 1059   BASOSABS 0.0 06/14/2017 1120   BASOSABS 0.0 03/13/2015 1059     Chemistry      Component Value Date/Time   NA 144 04/21/2017 1152   NA 144 04/04/2014 1019   K 4.1 04/21/2017 1152   K 4.5 04/04/2014 1019   CL 106 04/21/2017 1152   CL 111 (H) 12/28/2011 0825   CO2 24 04/21/2017 1152   CO2 26 04/04/2014 1019   BUN 17 04/21/2017 1152   BUN 16.9 04/04/2014 1019   CREATININE 1.35 (H) 04/21/2017 1152   CREATININE 0.92 01/28/2015 0811   CREATININE 0.9 04/04/2014 1019      Component Value Date/Time   CALCIUM 9.5 04/21/2017 1152   CALCIUM 9.3 04/04/2014 1019   ALKPHOS 71 01/14/2016 1025   ALKPHOS 140 04/04/2014 1019   AST 21 01/14/2016 1025   AST 30  04/04/2014 1019   ALT 15 01/14/2016 1025   ALT 25 04/04/2014 1019   BILITOT 1.0 01/14/2016 1025   BILITOT 0.43 04/04/2014 1019       Radiological Studies: No results found.  Impression:  1.  HHT with primary source of bleeding the GI tract and nasal mucosa. She continues on intermittent transfusion and iron infusions.  2.  AVMs in the stomach secondary to #1 with prior need for laser coagulation.  3.  Chronic iron deficiency anemia secondary to chronic GI blood loss from 1 and 2.  4.  Essential hypertension  5.  Chronic diastolic heart failure secondary to 4.  6.  idiopathic calf pain.  7.  Benign heart murmur with no valvular abnormalities  8.  Minimal bilateral infrainguinal peripheral vascular disease which does not account for all of her symptoms of chronic calf pain per vascular surgery evaluation in November 2018.  No surgery recommended.  9.  Type 2 diabetes currently on insulin  CC: Patient Care Team: [name], DO as PCP - General (Internal  Medicine)   Murriel Hopper, MD, [place]  Hematology-Oncology/Internal Medicine     5/6/20192:08 PM

## 2017-06-21 ENCOUNTER — Telehealth: Payer: Self-pay | Admitting: *Deleted

## 2017-06-21 ENCOUNTER — Other Ambulatory Visit: Payer: Self-pay | Admitting: Oncology

## 2017-06-21 ENCOUNTER — Other Ambulatory Visit (INDEPENDENT_AMBULATORY_CARE_PROVIDER_SITE_OTHER): Payer: Medicare Other

## 2017-06-21 DIAGNOSIS — I780 Hereditary hemorrhagic telangiectasia: Secondary | ICD-10-CM

## 2017-06-21 DIAGNOSIS — D500 Iron deficiency anemia secondary to blood loss (chronic): Secondary | ICD-10-CM | POA: Diagnosis not present

## 2017-06-21 DIAGNOSIS — K31819 Angiodysplasia of stomach and duodenum without bleeding: Secondary | ICD-10-CM | POA: Diagnosis not present

## 2017-06-21 DIAGNOSIS — D649 Anemia, unspecified: Secondary | ICD-10-CM

## 2017-06-21 LAB — CBC WITH DIFFERENTIAL/PLATELET
Basophils Absolute: 0 10*3/uL (ref 0.0–0.1)
Basophils Relative: 0 %
EOS PCT: 6 %
Eosinophils Absolute: 0.3 10*3/uL (ref 0.0–0.7)
HEMATOCRIT: 23.4 % — AB (ref 36.0–46.0)
Hemoglobin: 7.1 g/dL — ABNORMAL LOW (ref 12.0–15.0)
LYMPHS ABS: 0.5 10*3/uL — AB (ref 0.7–4.0)
Lymphocytes Relative: 9 %
MCH: 30.9 pg (ref 26.0–34.0)
MCHC: 30.3 g/dL (ref 30.0–36.0)
MCV: 101.7 fL — AB (ref 78.0–100.0)
MONO ABS: 0.3 10*3/uL (ref 0.1–1.0)
MONOS PCT: 6 %
NEUTROS ABS: 3.9 10*3/uL (ref 1.7–7.7)
Neutrophils Relative %: 79 %
PLATELETS: 186 10*3/uL (ref 150–400)
RBC: 2.3 MIL/uL — ABNORMAL LOW (ref 3.87–5.11)
RDW: 15.2 % (ref 11.5–15.5)
WBC: 4.9 10*3/uL (ref 4.0–10.5)

## 2017-06-21 LAB — SAMPLE TO BLOOD BANK

## 2017-06-21 NOTE — Telephone Encounter (Signed)
-----   Message from Ebbie Latus, RN sent at 06/18/2017 11:44 AM EDT ----- Dr Juleen China - I will off on Monday so call [facility] instead. Thanks Glenda ----- Message ----- From: [name], MD Sent: 06/18/2017  11:10 AM To: Ebbie Latus, RN, [name], DO  Mitzi Hansen - I am out of town next week. Can I have Glenda call you Monday with CBC on Mrs Berrian?  If Hb</=8, please et her up for 2 units of PRBCs at [facility] short stay. Thanks! DrG

## 2017-06-21 NOTE — Telephone Encounter (Signed)
Received call from DrGranfortuna-pt will need to have infusion prior to Fri.  Call made Short Stay [phone]) to see if they can give 1 unit PRBCS either tomorrow or Wed and receive the other unit by the end of the week-no answer, message left on recorder.  Message also left with [facility] (answering service) ph# [phone]

## 2017-06-21 NOTE — Telephone Encounter (Signed)
Pt here to obtain labs-Hgb 7.1 today.  Call made to Short Stay to schedule 2 unitS PRBCs-1st available time is Fri 5/17 at 8am.  Will send this info to pcp for review and to place orders-please advise.Despina Hidden Cassady5/13/20193:33 PM

## 2017-06-22 ENCOUNTER — Other Ambulatory Visit: Payer: Self-pay | Admitting: Internal Medicine

## 2017-06-22 NOTE — Telephone Encounter (Signed)
Thanks Liana Gerold - I already put in orders DrG

## 2017-06-22 NOTE — Telephone Encounter (Signed)
Thanks [employer].  I agree Ms Fiorenza needs transfusion sooner than Friday.  I will place orders for 2 units so they are available.

## 2017-06-22 NOTE — Telephone Encounter (Signed)
Follow up call made to [place] [facility])-pt scheduled for 2 units PRBCs appt time 8am-will inform patient.Regenia Skeeter, Darlene Cassady5/14/20199:00 AM

## 2017-06-23 ENCOUNTER — Ambulatory Visit (HOSPITAL_COMMUNITY)
Admission: RE | Admit: 2017-06-23 | Discharge: 2017-06-23 | Disposition: A | Discharge status: Home / Self Care | Payer: Medicare Other | Source: Ambulatory Visit | Attending: Oncology | Admitting: Oncology

## 2017-06-23 DIAGNOSIS — K31819 Angiodysplasia of stomach and duodenum without bleeding: Secondary | ICD-10-CM | POA: Insufficient documentation

## 2017-06-23 DIAGNOSIS — D649 Anemia, unspecified: Secondary | ICD-10-CM | POA: Diagnosis present

## 2017-06-23 DIAGNOSIS — I780 Hereditary hemorrhagic telangiectasia: Secondary | ICD-10-CM | POA: Insufficient documentation

## 2017-06-23 LAB — PREPARE RBC (CROSSMATCH)

## 2017-06-23 MED ORDER — SODIUM CHLORIDE 0.9 % IV SOLN
Freq: Once | INTRAVENOUS | Status: AC
  Administered 2017-06-23: 10:00:00 via INTRAVENOUS

## 2017-06-23 MED ORDER — HEPARIN SOD (PORK) LOCK FLUSH 100 UNIT/ML IV SOLN
500.0000 [IU] | INTRAVENOUS | Status: AC | PRN
  Administered 2017-06-23: 500 [IU]
  Filled 2017-06-23: qty 5

## 2017-06-23 MED ORDER — SODIUM CHLORIDE 0.9% FLUSH
10.0000 mL | INTRAVENOUS | Status: AC | PRN
  Administered 2017-06-23: 10 mL

## 2017-06-23 NOTE — Discharge Instructions (Signed)
Blood Transfusion, Adult, Care After This sheet gives you information about how to care for yourself after your procedure. Your health care provider may also give you more specific instructions. If you have problems or questions, contact your health care provider. What can I expect after the procedure? After your procedure, it is common to have:  Bruising and soreness where the IV tube was inserted.  Headache.  Follow these instructions at home:  Take over-the-counter and prescription medicines only as told by your health care provider.  Return to your normal activities as told by your health care provider.  Follow instructions from your health care provider about how to take care of your IV insertion site. Make sure you: ? Wash your hands with soap and water before you change your bandage (dressing). If soap and water are not available, use hand sanitizer. ? Change your dressing as told by your health care provider.  Check your IV insertion site every day for signs of infection. Check for: ? More redness, swelling, or pain. ? More fluid or blood. ? Warmth. ? Pus or a bad smell. Contact a health care provider if:  You have more redness, swelling, or pain around the IV insertion site.  You have more fluid or blood coming from the IV insertion site.  Your IV insertion site feels warm to the touch.  You have pus or a bad smell coming from the IV insertion site.  Your urine turns pink, red, or brown.  You feel weak after doing your normal activities. Get help right away if:  You have signs of a serious allergic or immune system reaction, including: ? Itchiness. ? Hives. ? Trouble breathing. ? Anxiety. ? Chest or lower back pain. ? Fever, flushing, and chills. ? Rapid pulse. ? Rash. ? Diarrhea. ? Vomiting. ? Dark urine. ? Serious headache. ? Dizziness. ? Stiff neck. ? Yellow coloration of the face or the white parts of the eyes (jaundice). This information is not  intended to replace advice given to you by your health care provider. Make sure you discuss any questions you have with your health care provider. Document Released: 02/16/2014 Document Revised: 09/25/2015 Document Reviewed: 08/12/2015 Elsevier Interactive Patient Education  2018 Elsevier Inc.  

## 2017-06-23 NOTE — Progress Notes (Signed)
PATIENT CARE CENTER NOTE  Diagnosis: Anemia    Provider: Dr.Granfortuna    Procedure: 2 units PRBC's    Note: Patient received 2 units of blood. Patient tolerated transfusion well with no adverse reaction. Vital signs remained stable. Discharge instructions given to patient. Patient alert, oriented and ambulatory at discharge.

## 2017-06-24 LAB — BPAM RBC
Blood Product Expiration Date: 201906042359
Blood Product Expiration Date: 201906042359
ISSUE DATE / TIME: 201905150935
ISSUE DATE / TIME: 201905150935
Unit Type and Rh: 6200
Unit Type and Rh: 6200

## 2017-06-24 LAB — TYPE AND SCREEN
ABO/RH(D): A POS
Antibody Screen: NEGATIVE
Unit division: 0
Unit division: 0

## 2017-06-25 ENCOUNTER — Encounter (HOSPITAL_COMMUNITY): Payer: Medicare Other

## 2017-06-28 ENCOUNTER — Other Ambulatory Visit (INDEPENDENT_AMBULATORY_CARE_PROVIDER_SITE_OTHER): Payer: Medicare Other

## 2017-06-28 ENCOUNTER — Telehealth: Payer: Self-pay | Admitting: *Deleted

## 2017-06-28 DIAGNOSIS — K31819 Angiodysplasia of stomach and duodenum without bleeding: Secondary | ICD-10-CM | POA: Diagnosis not present

## 2017-06-28 DIAGNOSIS — D500 Iron deficiency anemia secondary to blood loss (chronic): Secondary | ICD-10-CM

## 2017-06-28 DIAGNOSIS — I780 Hereditary hemorrhagic telangiectasia: Secondary | ICD-10-CM | POA: Diagnosis not present

## 2017-06-28 LAB — CBC WITH DIFFERENTIAL/PLATELET
ABS IMMATURE GRANULOCYTES: 0 10*3/uL (ref 0.0–0.1)
Basophils Absolute: 0 10*3/uL (ref 0.0–0.1)
Basophils Relative: 1 %
Eosinophils Absolute: 0.4 10*3/uL (ref 0.0–0.7)
Eosinophils Relative: 9 %
HEMATOCRIT: 33.3 % — AB (ref 36.0–46.0)
HEMOGLOBIN: 10.1 g/dL — AB (ref 12.0–15.0)
IMMATURE GRANULOCYTES: 0 %
LYMPHS PCT: 13 %
Lymphs Abs: 0.6 10*3/uL — ABNORMAL LOW (ref 0.7–4.0)
MCH: 29 pg (ref 26.0–34.0)
MCHC: 30.3 g/dL (ref 30.0–36.0)
MCV: 95.7 fL (ref 78.0–100.0)
MONOS PCT: 8 %
Monocytes Absolute: 0.4 10*3/uL (ref 0.1–1.0)
NEUTROS ABS: 3.2 10*3/uL (ref 1.7–7.7)
NEUTROS PCT: 69 %
PLATELETS: 159 10*3/uL (ref 150–400)
RBC: 3.48 MIL/uL — ABNORMAL LOW (ref 3.87–5.11)
RDW: 15.5 % (ref 11.5–15.5)
WBC: 4.5 10*3/uL (ref 4.0–10.5)

## 2017-06-28 LAB — SAMPLE TO BLOOD BANK

## 2017-06-28 LAB — FERRITIN: Ferritin: 19 ng/mL (ref 11–307)

## 2017-06-28 NOTE — Telephone Encounter (Signed)
Called pt - talked to [name]; informed "Hgb good (@ 10.1). No blood this week. OK to check again next Thursday" per Dr [name]. Pt had requested to change lab appt from Tues to Thurs next week. Dr Darnell Level stated will decide on the following week (week of 6/3) date based on next week labs.

## 2017-06-28 NOTE — Telephone Encounter (Signed)
-----   Message from [name], MD sent at 06/28/2017 11:38 AM EDT ----- Call pt Hb good. No blood this week. OK to check again next Thursday

## 2017-07-02 ENCOUNTER — Other Ambulatory Visit: Payer: Self-pay | Admitting: Internal Medicine

## 2017-07-02 DIAGNOSIS — E1165 Type 2 diabetes mellitus with hyperglycemia: Principal | ICD-10-CM

## 2017-07-02 DIAGNOSIS — E1121 Type 2 diabetes mellitus with diabetic nephropathy: Secondary | ICD-10-CM

## 2017-07-02 DIAGNOSIS — IMO0002 Reserved for concepts with insufficient information to code with codable children: Secondary | ICD-10-CM

## 2017-07-06 ENCOUNTER — Other Ambulatory Visit: Payer: Medicare Other

## 2017-07-06 NOTE — Progress Notes (Signed)
Lab only 

## 2017-07-08 ENCOUNTER — Telehealth: Payer: Self-pay | Admitting: *Deleted

## 2017-07-08 ENCOUNTER — Other Ambulatory Visit (INDEPENDENT_AMBULATORY_CARE_PROVIDER_SITE_OTHER): Payer: Medicare Other

## 2017-07-08 ENCOUNTER — Other Ambulatory Visit: Payer: Self-pay | Admitting: Oncology

## 2017-07-08 DIAGNOSIS — I780 Hereditary hemorrhagic telangiectasia: Secondary | ICD-10-CM

## 2017-07-08 DIAGNOSIS — K31819 Angiodysplasia of stomach and duodenum without bleeding: Secondary | ICD-10-CM | POA: Diagnosis not present

## 2017-07-08 DIAGNOSIS — D500 Iron deficiency anemia secondary to blood loss (chronic): Secondary | ICD-10-CM

## 2017-07-08 DIAGNOSIS — D649 Anemia, unspecified: Secondary | ICD-10-CM | POA: Diagnosis not present

## 2017-07-08 LAB — CBC WITH DIFFERENTIAL/PLATELET
ABS IMMATURE GRANULOCYTES: 0 10*3/uL (ref 0.0–0.1)
Basophils Absolute: 0 10*3/uL (ref 0.0–0.1)
Basophils Relative: 0 %
Eosinophils Absolute: 0.3 10*3/uL (ref 0.0–0.7)
Eosinophils Relative: 6 %
HCT: 23.3 % — ABNORMAL LOW (ref 36.0–46.0)
HEMOGLOBIN: 6.8 g/dL — AB (ref 12.0–15.0)
Immature Granulocytes: 0 %
LYMPHS PCT: 11 %
Lymphs Abs: 0.5 10*3/uL — ABNORMAL LOW (ref 0.7–4.0)
MCH: 29.4 pg (ref 26.0–34.0)
MCHC: 29.2 g/dL — ABNORMAL LOW (ref 30.0–36.0)
MCV: 100.9 fL — AB (ref 78.0–100.0)
MONO ABS: 0.4 10*3/uL (ref 0.1–1.0)
Monocytes Relative: 8 %
NEUTROS ABS: 3.4 10*3/uL (ref 1.7–7.7)
Neutrophils Relative %: 75 %
Platelets: 183 10*3/uL (ref 150–400)
RBC: 2.31 MIL/uL — ABNORMAL LOW (ref 3.87–5.11)
RDW: 15.9 % — ABNORMAL HIGH (ref 11.5–15.5)
WBC: 4.6 10*3/uL (ref 4.0–10.5)

## 2017-07-08 LAB — SAMPLE TO BLOOD BANK

## 2017-07-08 NOTE — Telephone Encounter (Signed)
Hgb 6.8; no available appts here [facility] short stay, [facility].  Dr [name] has arranged for pt to receive transfusion on Saturday @ 0800 Am at the [place].  Called pt - talked to her husband, informed hgb 6.8 and will need 2 units of blood; appt info given to pt's husband.

## 2017-07-09 ENCOUNTER — Other Ambulatory Visit: Payer: Self-pay | Admitting: Oncology

## 2017-07-09 DIAGNOSIS — K31819 Angiodysplasia of stomach and duodenum without bleeding: Secondary | ICD-10-CM

## 2017-07-09 DIAGNOSIS — R040 Epistaxis: Secondary | ICD-10-CM

## 2017-07-09 DIAGNOSIS — D649 Anemia, unspecified: Secondary | ICD-10-CM

## 2017-07-09 DIAGNOSIS — I780 Hereditary hemorrhagic telangiectasia: Secondary | ICD-10-CM

## 2017-07-10 ENCOUNTER — Other Ambulatory Visit: Payer: Self-pay | Admitting: Medical Oncology

## 2017-07-10 ENCOUNTER — Inpatient Hospital Stay: Payer: Medicare Other | Attending: Oncology

## 2017-07-10 DIAGNOSIS — D649 Anemia, unspecified: Secondary | ICD-10-CM

## 2017-07-10 DIAGNOSIS — I780 Hereditary hemorrhagic telangiectasia: Secondary | ICD-10-CM | POA: Diagnosis not present

## 2017-07-10 DIAGNOSIS — R040 Epistaxis: Secondary | ICD-10-CM

## 2017-07-10 DIAGNOSIS — K31819 Angiodysplasia of stomach and duodenum without bleeding: Secondary | ICD-10-CM | POA: Diagnosis not present

## 2017-07-10 DIAGNOSIS — D500 Iron deficiency anemia secondary to blood loss (chronic): Secondary | ICD-10-CM | POA: Diagnosis not present

## 2017-07-10 LAB — PREPARE RBC (CROSSMATCH)

## 2017-07-10 MED ORDER — SODIUM CHLORIDE 0.9% FLUSH
3.0000 mL | INTRAVENOUS | Status: DC | PRN
  Filled 2017-07-10: qty 10

## 2017-07-10 MED ORDER — SODIUM CHLORIDE 0.9% FLUSH
10.0000 mL | INTRAVENOUS | Status: AC | PRN
  Administered 2017-07-10: 10 mL
  Filled 2017-07-10: qty 10

## 2017-07-10 MED ORDER — SODIUM CHLORIDE 0.9 % IV SOLN
250.0000 mL | Freq: Once | INTRAVENOUS | Status: AC
  Administered 2017-07-10: 250 mL via INTRAVENOUS

## 2017-07-10 MED ORDER — HEPARIN SOD (PORK) LOCK FLUSH 100 UNIT/ML IV SOLN
500.0000 [IU] | Freq: Every day | INTRAVENOUS | Status: AC | PRN
  Administered 2017-07-10: 500 [IU]
  Filled 2017-07-10: qty 5

## 2017-07-10 MED ORDER — HEPARIN SOD (PORK) LOCK FLUSH 100 UNIT/ML IV SOLN
250.0000 [IU] | INTRAVENOUS | Status: DC | PRN
  Filled 2017-07-10: qty 5

## 2017-07-10 NOTE — Patient Instructions (Signed)

## 2017-07-12 ENCOUNTER — Other Ambulatory Visit (INDEPENDENT_AMBULATORY_CARE_PROVIDER_SITE_OTHER): Payer: Medicare Other

## 2017-07-12 ENCOUNTER — Other Ambulatory Visit: Payer: Self-pay | Admitting: *Deleted

## 2017-07-12 DIAGNOSIS — I780 Hereditary hemorrhagic telangiectasia: Secondary | ICD-10-CM | POA: Diagnosis not present

## 2017-07-12 DIAGNOSIS — D500 Iron deficiency anemia secondary to blood loss (chronic): Secondary | ICD-10-CM

## 2017-07-12 DIAGNOSIS — K31819 Angiodysplasia of stomach and duodenum without bleeding: Secondary | ICD-10-CM | POA: Diagnosis not present

## 2017-07-12 LAB — BPAM RBC
BLOOD PRODUCT EXPIRATION DATE: 201906242359
Blood Product Expiration Date: 201906242359
ISSUE DATE / TIME: 201906011131
ISSUE DATE / TIME: 201906010955
UNIT TYPE AND RH: 6200
Unit Type and Rh: 6200

## 2017-07-12 LAB — CBC WITH DIFFERENTIAL/PLATELET
Abs Immature Granulocytes: 0 10*3/uL (ref 0.0–0.1)
BASOS ABS: 0 10*3/uL (ref 0.0–0.1)
Basophils Relative: 0 %
EOS PCT: 4 %
Eosinophils Absolute: 0.2 10*3/uL (ref 0.0–0.7)
HEMATOCRIT: 31.6 % — AB (ref 36.0–46.0)
Hemoglobin: 9.4 g/dL — ABNORMAL LOW (ref 12.0–15.0)
Immature Granulocytes: 0 %
Lymphocytes Relative: 8 %
Lymphs Abs: 0.4 10*3/uL — ABNORMAL LOW (ref 0.7–4.0)
MCH: 28.7 pg (ref 26.0–34.0)
MCHC: 29.7 g/dL — ABNORMAL LOW (ref 30.0–36.0)
MCV: 96.3 fL (ref 78.0–100.0)
MONO ABS: 0.4 10*3/uL (ref 0.1–1.0)
Monocytes Relative: 8 %
Neutro Abs: 4.4 10*3/uL (ref 1.7–7.7)
Neutrophils Relative %: 80 %
Platelets: 199 10*3/uL (ref 150–400)
RBC: 3.28 MIL/uL — AB (ref 3.87–5.11)
RDW: 16.4 % — AB (ref 11.5–15.5)
WBC: 5.5 10*3/uL (ref 4.0–10.5)

## 2017-07-12 LAB — TYPE AND SCREEN
ABO/RH(D): A POS
Antibody Screen: NEGATIVE
Unit division: 0
Unit division: 0

## 2017-07-12 LAB — SAMPLE TO BLOOD BANK

## 2017-07-13 ENCOUNTER — Telehealth: Payer: Self-pay | Admitting: *Deleted

## 2017-07-13 ENCOUNTER — Other Ambulatory Visit: Payer: Self-pay | Admitting: Internal Medicine

## 2017-07-13 DIAGNOSIS — E1165 Type 2 diabetes mellitus with hyperglycemia: Principal | ICD-10-CM

## 2017-07-13 DIAGNOSIS — E1121 Type 2 diabetes mellitus with diabetic nephropathy: Secondary | ICD-10-CM

## 2017-07-13 DIAGNOSIS — IMO0002 Reserved for concepts with insufficient information to code with codable children: Secondary | ICD-10-CM

## 2017-07-13 NOTE — Telephone Encounter (Signed)
Called pt - talked to [name]; informed pt "Hb OK no more blood this week (hgb 9.4) - continue Q Mon CBC " per D Granfortuna. Stated ok and will let her know.

## 2017-07-13 NOTE — Telephone Encounter (Signed)
-----   Message from [name], MD sent at 07/12/2017  3:50 PM EDT ----- Call pt: Hb OK no more blood this week - continue Q Mon CBC

## 2017-07-15 ENCOUNTER — Ambulatory Visit (INDEPENDENT_AMBULATORY_CARE_PROVIDER_SITE_OTHER): Payer: Medicare Other | Admitting: Internal Medicine

## 2017-07-15 ENCOUNTER — Other Ambulatory Visit: Payer: Self-pay

## 2017-07-15 ENCOUNTER — Encounter: Payer: Self-pay | Admitting: Internal Medicine

## 2017-07-15 VITALS — BP 133/42 | HR 63 | Temp 98.4°F | Ht 63.0 in | Wt 171.7 lb

## 2017-07-15 DIAGNOSIS — M79604 Pain in right leg: Secondary | ICD-10-CM | POA: Insufficient documentation

## 2017-07-15 DIAGNOSIS — Z79899 Other long term (current) drug therapy: Secondary | ICD-10-CM | POA: Diagnosis not present

## 2017-07-15 DIAGNOSIS — R531 Weakness: Secondary | ICD-10-CM

## 2017-07-15 DIAGNOSIS — E1165 Type 2 diabetes mellitus with hyperglycemia: Secondary | ICD-10-CM

## 2017-07-15 DIAGNOSIS — E1151 Type 2 diabetes mellitus with diabetic peripheral angiopathy without gangrene: Secondary | ICD-10-CM

## 2017-07-15 DIAGNOSIS — L439 Lichen planus, unspecified: Secondary | ICD-10-CM | POA: Diagnosis not present

## 2017-07-15 DIAGNOSIS — E118 Type 2 diabetes mellitus with unspecified complications: Secondary | ICD-10-CM

## 2017-07-15 DIAGNOSIS — N289 Disorder of kidney and ureter, unspecified: Secondary | ICD-10-CM | POA: Diagnosis not present

## 2017-07-15 DIAGNOSIS — Z794 Long term (current) use of insulin: Secondary | ICD-10-CM | POA: Diagnosis not present

## 2017-07-15 DIAGNOSIS — E1129 Type 2 diabetes mellitus with other diabetic kidney complication: Secondary | ICD-10-CM | POA: Diagnosis not present

## 2017-07-15 DIAGNOSIS — M79605 Pain in left leg: Secondary | ICD-10-CM

## 2017-07-15 LAB — POCT GLYCOSYLATED HEMOGLOBIN (HGB A1C): Hemoglobin A1C: 5.9 % — AB (ref 4.0–5.6)

## 2017-07-15 LAB — GLUCOSE, CAPILLARY: Glucose-Capillary: 132 mg/dL — ABNORMAL HIGH (ref 65–99)

## 2017-07-15 MED ORDER — TRIAMCINOLONE ACETONIDE 0.5 % EX OINT: 1.0000 "application " | TOPICAL_OINTMENT | Freq: Two times a day (BID) | CUTANEOUS | 0 refills | Status: DC

## 2017-07-15 MED ORDER — INSULIN GLARGINE 100 UNIT/ML SOLOSTAR PEN: 28.0000 [IU] | PEN_INJECTOR | Freq: Every day | SUBCUTANEOUS | 2 refills | Status: DC

## 2017-07-15 NOTE — Assessment & Plan Note (Signed)
Repeat A1C 5.9. Currently well controlled. Blood glucose readings very variable. Patient had several AM hypoglycemic readings with BG <70. Some AM readings >250. Glucose average 194. Would like to prevent hypoglycemic episodes. Unclear why patient is on Lantus 16 units BID. Will like to decrease total lantus dose and make dosing once daily.   Plan: -Start Lantus 28 units daily  -Continue Humalog 10 units TID WC -Follow up in 3 months

## 2017-07-15 NOTE — Assessment & Plan Note (Addendum)
Patient presenting with chronic lower extremity pain for months duration. She states her pain is constant and is from her knees down. The pain is not worse with walking or exercise. Tylenol sometimes makes it better. She states she has trouble performing her ADLs because of the leg pain and it has slowly been getting progressively worse. The leg pain also has been bother her at night. She is able to ambulate at home, but she also uses a wheelchair and walker when needed. She has not had any falls. She uses stationary pedals to get exercise and has not been to physical therapy. She was previously evaluated by vascular surgery who performed dopplers that showed infrainguinal arterial occulusion bilaterally, but did not recommend aggressive treatment of her vascular disease, and she is not an aspirin candidate. No focal deficits on examination, strength intact. Extremities appear well perfused, no DP pulses palpated bilaterally.   With the patient's know infrainguinal arterial occlusion and progression of symptoms would like her to follow up with Vascular Surgery to be re-evaluated as her pain is likely related to her PVD. Does not seem neuorpathic.   Plan: -Vascular follow up -Continue Tylenol PRN for pain -continue daily exercise

## 2017-07-15 NOTE — Assessment & Plan Note (Signed)
Patient complaining of itching rash on bilateral thighs. States she has had this rash for a long time and it is not new, but is itchier than normal. Rash with hyperpigmented and violaceous plaques and papules consistent with lichen planus.   Plan: -Triamcinolone BID to rash

## 2017-07-15 NOTE — Progress Notes (Signed)
   CC: bilateral LE pain   HPI:  [name] is a 77 y.o. female with past medical history as documented below presenting with a chief complaint of bilateral lower extremity pain and itchy leg rash. Please see encounter based charting for a more detailed description of the patient's acute and chronic medical problems.   Past Medical History:  Diagnosis Date  . Chronic anemia   . Chronic diastolic CHF (congestive heart failure) ([place]) 10/03/2013  . Chronic GI bleeding    [name] 11/29/2014  . Family history of anesthesia complication    "niece has a hard time coming out" (09/15/2012)  . Frequent nosebleeds    chronic  . Gastric AV malformation    [name] 11/29/2014  . GERD (gastroesophageal reflux disease)   . 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 History of blood transfusion "several"  . History of epistaxis   . HTN (hypertension), benign 03/02/2012  . Hyperlipidemia   . Iron deficiency anemia    chronic infusions"  . Lichen planus    Both lower extremities  . 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 Pneumonia 1990's X 2  . Pulmonary HTN ([place]) 04/01/2017   PASP 29mmHg on echo 09/2016  . Seizures ([place]) 09/2014  . Telangiectasia    Gastric   . 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   Review of Systems:   Review of Systems  Constitutional: Negative for chills and fever.  Respiratory: Negative for shortness of breath.   Cardiovascular: Positive for leg swelling. Negative for chest pain.  Neurological: Positive for weakness. Negative for sensory change.    Physical Exam:  Vitals:   07/15/17 1021  BP: (!) 133/42  Pulse: 63  Temp: 98.4 F (36.9 C)  TempSrc: Oral  SpO2: 98%  Weight: 171 lb 11.2 oz (77.9 kg)  Height: 5\' 3"  (1.6 m)   Physical Exam  Constitutional: She is oriented to person, place, and time. She appears well-developed and well-nourished. No distress.  HENT:  Head: Normocephalic and atraumatic.  Eyes:  Conjunctivae are normal. No scleral icterus.  Neck: Normal range of motion.  Cardiovascular: Normal rate, regular rhythm and normal heart sounds.  Pulses:      Dorsalis pedis pulses are 0 on the right side, and 0 on the left side.  Pulmonary/Chest: Effort normal. No respiratory distress. She has no rales.  Musculoskeletal: Normal range of motion. She exhibits no edema.  Lower extremities warm to touch and appear well perfused, 2+ pitting edema bilaterally to shins. No DP pulse bilaterally.   Lymphadenopathy:    She has no cervical adenopathy.  Neurological: She is alert and oriented to person, place, and time. She has normal strength. No sensory deficit.  Skin: Skin is warm and dry. Rash noted.  Hyperpigmented violaceous plaques and papules on things bilaterally. Hypopigmented healed lesions on knees and shins bilaterally.     Assessment & Plan:   See Encounters Tab for problem based charting.  Patient discussed with Dr. Evette Doffing

## 2017-07-15 NOTE — Patient Instructions (Addendum)
Ms. Mchan,   Continue to take Tyelnol for you leg pain.   I would like you to go back and see the [facility] about your leg pain. Please call: [phone] to make an appointment.  Continue your daily exercise regimen.   I have changed you Lantus dosing. Take 28 units of Lantus every morning. No longer take Lantus twice daily.   Continue to take Humalog 10 units with meals.

## 2017-07-16 ENCOUNTER — Ambulatory Visit: Payer: Medicare Other

## 2017-07-16 NOTE — Progress Notes (Signed)
Internal Medicine Clinic Attending  Case discussed with Dr. LaCroce at the time of the visit.  We reviewed the resident's history and exam and pertinent patient test results.  I agree with the assessment, diagnosis, and plan of care documented in the resident's note.  

## 2017-07-19 ENCOUNTER — Telehealth: Payer: Self-pay | Admitting: *Deleted

## 2017-07-19 ENCOUNTER — Other Ambulatory Visit (INDEPENDENT_AMBULATORY_CARE_PROVIDER_SITE_OTHER): Payer: Medicare Other

## 2017-07-19 DIAGNOSIS — I780 Hereditary hemorrhagic telangiectasia: Secondary | ICD-10-CM

## 2017-07-19 DIAGNOSIS — K31819 Angiodysplasia of stomach and duodenum without bleeding: Secondary | ICD-10-CM

## 2017-07-19 DIAGNOSIS — D500 Iron deficiency anemia secondary to blood loss (chronic): Secondary | ICD-10-CM

## 2017-07-19 LAB — CBC WITH DIFFERENTIAL/PLATELET
Abs Immature Granulocytes: 0 10*3/uL (ref 0.0–0.1)
BASOS PCT: 1 %
Basophils Absolute: 0 10*3/uL (ref 0.0–0.1)
EOS ABS: 0.2 10*3/uL (ref 0.0–0.7)
Eosinophils Relative: 4 %
HCT: 32.7 % — ABNORMAL LOW (ref 36.0–46.0)
Hemoglobin: 9.9 g/dL — ABNORMAL LOW (ref 12.0–15.0)
Immature Granulocytes: 0 %
Lymphocytes Relative: 10 %
Lymphs Abs: 0.5 10*3/uL — ABNORMAL LOW (ref 0.7–4.0)
MCH: 29.3 pg (ref 26.0–34.0)
MCHC: 30.3 g/dL (ref 30.0–36.0)
MCV: 96.7 fL (ref 78.0–100.0)
MONO ABS: 0.5 10*3/uL (ref 0.1–1.0)
MONOS PCT: 10 %
Neutro Abs: 4.1 10*3/uL (ref 1.7–7.7)
Neutrophils Relative %: 75 %
PLATELETS: 164 10*3/uL (ref 150–400)
RBC: 3.38 MIL/uL — ABNORMAL LOW (ref 3.87–5.11)
RDW: 15.6 % — AB (ref 11.5–15.5)
WBC: 5.5 10*3/uL (ref 4.0–10.5)

## 2017-07-19 LAB — SAMPLE TO BLOOD BANK

## 2017-07-19 NOTE — Telephone Encounter (Signed)
-----   Message from [name], MD sent at 07/19/2017  1:31 PM EDT ----- Call pt: blood count good. No transfusion this week

## 2017-07-19 NOTE — Telephone Encounter (Signed)
Message delivered to pt's spouse

## 2017-07-19 NOTE — Telephone Encounter (Signed)
Pt called / informed "blood count good (@ 9.9). No transfusion this week " per Dr [name]. Stated "good"' ; continues to have leg pain but otherwise feeling well.

## 2017-07-20 LAB — HM DIABETES EYE EXAM

## 2017-07-26 ENCOUNTER — Other Ambulatory Visit: Payer: Self-pay | Admitting: Oncology

## 2017-07-26 ENCOUNTER — Other Ambulatory Visit (INDEPENDENT_AMBULATORY_CARE_PROVIDER_SITE_OTHER): Payer: Medicare Other

## 2017-07-26 DIAGNOSIS — I780 Hereditary hemorrhagic telangiectasia: Secondary | ICD-10-CM

## 2017-07-26 DIAGNOSIS — K31819 Angiodysplasia of stomach and duodenum without bleeding: Secondary | ICD-10-CM

## 2017-07-26 DIAGNOSIS — D500 Iron deficiency anemia secondary to blood loss (chronic): Secondary | ICD-10-CM

## 2017-07-26 LAB — FERRITIN: FERRITIN: 8 ng/mL — AB (ref 11–307)

## 2017-07-26 LAB — CBC WITH DIFFERENTIAL/PLATELET
ABS IMMATURE GRANULOCYTES: 0 10*3/uL (ref 0.0–0.1)
BASOS ABS: 0 10*3/uL (ref 0.0–0.1)
BASOS PCT: 1 %
Eosinophils Absolute: 0.3 10*3/uL (ref 0.0–0.7)
Eosinophils Relative: 7 %
HCT: 29.9 % — ABNORMAL LOW (ref 36.0–46.0)
Hemoglobin: 8.8 g/dL — ABNORMAL LOW (ref 12.0–15.0)
Immature Granulocytes: 0 %
Lymphocytes Relative: 10 %
Lymphs Abs: 0.4 10*3/uL — ABNORMAL LOW (ref 0.7–4.0)
MCH: 28.4 pg (ref 26.0–34.0)
MCHC: 29.4 g/dL — AB (ref 30.0–36.0)
MCV: 96.5 fL (ref 78.0–100.0)
MONO ABS: 0.5 10*3/uL (ref 0.1–1.0)
Monocytes Relative: 11 %
NEUTROS ABS: 3.2 10*3/uL (ref 1.7–7.7)
Neutrophils Relative %: 71 %
PLATELETS: 188 10*3/uL (ref 150–400)
RBC: 3.1 MIL/uL — AB (ref 3.87–5.11)
RDW: 15.4 % (ref 11.5–15.5)
WBC: 4.4 10*3/uL (ref 4.0–10.5)

## 2017-07-26 LAB — SAMPLE TO BLOOD BANK

## 2017-07-27 ENCOUNTER — Telehealth: Payer: Self-pay | Admitting: *Deleted

## 2017-07-27 NOTE — Telephone Encounter (Signed)
Patient notified. Scheduled at Short Stay for IV faraheme 07/30/2017 at noon and 08/06/2017 at 1000. Pt will need to check in at admitting both days. Hubbard Hartshorn, RN, BSN

## 2017-07-27 NOTE — Telephone Encounter (Signed)
-----   Message from [name], MD sent at 07/26/2017  1:13 PM EDT ----- Call pt: No blood transfusion this week but please call short stay & see if they have a spot to give her feraheme IV iron this week & repeat next week. I will put in orders. Thanks DrG

## 2017-07-29 ENCOUNTER — Other Ambulatory Visit: Payer: Self-pay | Admitting: Oncology

## 2017-07-29 DIAGNOSIS — K31819 Angiodysplasia of stomach and duodenum without bleeding: Secondary | ICD-10-CM

## 2017-07-29 DIAGNOSIS — I780 Hereditary hemorrhagic telangiectasia: Secondary | ICD-10-CM

## 2017-07-29 DIAGNOSIS — D500 Iron deficiency anemia secondary to blood loss (chronic): Secondary | ICD-10-CM

## 2017-07-30 ENCOUNTER — Ambulatory Visit (HOSPITAL_COMMUNITY)
Admission: RE | Admit: 2017-07-30 | Discharge: 2017-07-30 | Disposition: A | Discharge status: Home / Self Care | Payer: Medicare Other | Source: Ambulatory Visit | Attending: Oncology | Admitting: Oncology

## 2017-07-30 DIAGNOSIS — K31819 Angiodysplasia of stomach and duodenum without bleeding: Secondary | ICD-10-CM | POA: Insufficient documentation

## 2017-07-30 DIAGNOSIS — I780 Hereditary hemorrhagic telangiectasia: Secondary | ICD-10-CM | POA: Diagnosis present

## 2017-07-30 DIAGNOSIS — D500 Iron deficiency anemia secondary to blood loss (chronic): Secondary | ICD-10-CM | POA: Insufficient documentation

## 2017-07-30 MED ORDER — SODIUM CHLORIDE 0.9 % IV SOLN
INTRAVENOUS | Status: DC | PRN
  Administered 2017-07-30: 12:00:00 via INTRAVENOUS

## 2017-07-30 MED ORDER — HEPARIN SOD (PORK) LOCK FLUSH 100 UNIT/ML IV SOLN
INTRAVENOUS | Status: AC
  Administered 2017-07-30: 500 [IU]
  Filled 2017-07-30: qty 5

## 2017-07-30 MED ORDER — HEPARIN SOD (PORK) LOCK FLUSH 100 UNIT/ML IV SOLN
500.0000 [IU] | INTRAVENOUS | Status: AC | PRN
  Administered 2017-07-30: 500 [IU]

## 2017-07-30 MED ORDER — FERUMOXYTOL INJECTION 510 MG/17 ML
510.0000 mg | INTRAVENOUS | Status: DC
  Administered 2017-07-30: 510 mg via INTRAVENOUS
  Filled 2017-07-30: qty 17

## 2017-08-02 ENCOUNTER — Other Ambulatory Visit (INDEPENDENT_AMBULATORY_CARE_PROVIDER_SITE_OTHER): Payer: Medicare Other

## 2017-08-02 ENCOUNTER — Telehealth: Payer: Self-pay | Admitting: *Deleted

## 2017-08-02 DIAGNOSIS — K31819 Angiodysplasia of stomach and duodenum without bleeding: Secondary | ICD-10-CM | POA: Diagnosis not present

## 2017-08-02 DIAGNOSIS — I780 Hereditary hemorrhagic telangiectasia: Secondary | ICD-10-CM

## 2017-08-02 DIAGNOSIS — D500 Iron deficiency anemia secondary to blood loss (chronic): Secondary | ICD-10-CM | POA: Diagnosis not present

## 2017-08-02 LAB — CBC WITH DIFFERENTIAL/PLATELET
Abs Immature Granulocytes: 0 10*3/uL (ref 0.0–0.1)
Basophils Absolute: 0 10*3/uL (ref 0.0–0.1)
Basophils Relative: 0 %
Eosinophils Absolute: 0.1 10*3/uL (ref 0.0–0.7)
Eosinophils Relative: 3 %
HEMATOCRIT: 31.7 % — AB (ref 36.0–46.0)
Hemoglobin: 9.4 g/dL — ABNORMAL LOW (ref 12.0–15.0)
IMMATURE GRANULOCYTES: 0 %
LYMPHS ABS: 0.4 10*3/uL — AB (ref 0.7–4.0)
LYMPHS PCT: 7 %
MCH: 29 pg (ref 26.0–34.0)
MCHC: 29.7 g/dL — ABNORMAL LOW (ref 30.0–36.0)
MCV: 97.8 fL (ref 78.0–100.0)
Monocytes Absolute: 0.4 10*3/uL (ref 0.1–1.0)
Monocytes Relative: 8 %
Neutro Abs: 4.2 10*3/uL (ref 1.7–7.7)
Neutrophils Relative %: 82 %
Platelets: 219 10*3/uL (ref 150–400)
RBC: 3.24 MIL/uL — AB (ref 3.87–5.11)
RDW: 15.3 % (ref 11.5–15.5)
WBC: 5.2 10*3/uL (ref 4.0–10.5)

## 2017-08-02 LAB — SAMPLE TO BLOOD BANK

## 2017-08-02 NOTE — Telephone Encounter (Signed)
Called pt - talked to her husband, informed"Blood count good (@ 9.4) no transfusion this week " per Dr [name]. But has an appt for 2nd Feraheme infusion on Friday.

## 2017-08-02 NOTE — Telephone Encounter (Signed)
-----   Message from [name], MD sent at 08/02/2017 12:17 PM EDT ----- Call pt: blood count good, no transfusion this week

## 2017-08-06 ENCOUNTER — Ambulatory Visit (HOSPITAL_COMMUNITY)
Admission: RE | Admit: 2017-08-06 | Discharge: 2017-08-06 | Disposition: A | Discharge status: Home / Self Care | Payer: Medicare Other | Source: Ambulatory Visit | Attending: Oncology | Admitting: Oncology

## 2017-08-06 DIAGNOSIS — I780 Hereditary hemorrhagic telangiectasia: Secondary | ICD-10-CM | POA: Diagnosis not present

## 2017-08-06 DIAGNOSIS — D500 Iron deficiency anemia secondary to blood loss (chronic): Secondary | ICD-10-CM

## 2017-08-06 DIAGNOSIS — K31819 Angiodysplasia of stomach and duodenum without bleeding: Secondary | ICD-10-CM

## 2017-08-06 MED ORDER — SODIUM CHLORIDE 0.9 % IV SOLN
510.0000 mg | INTRAVENOUS | Status: DC
  Administered 2017-08-06: 510 mg via INTRAVENOUS
  Filled 2017-08-06: qty 17

## 2017-08-06 MED ORDER — SODIUM CHLORIDE 0.9 % IV SOLN
INTRAVENOUS | Status: DC | PRN
  Administered 2017-08-06: 10:00:00 via INTRAVENOUS

## 2017-08-06 MED ORDER — HEPARIN SOD (PORK) LOCK FLUSH 100 UNIT/ML IV SOLN
INTRAVENOUS | Status: AC
  Administered 2017-08-06: 500 [IU]
  Filled 2017-08-06: qty 5

## 2017-08-07 ENCOUNTER — Encounter: Payer: Self-pay | Admitting: *Deleted

## 2017-08-09 ENCOUNTER — Encounter: Payer: Self-pay | Admitting: *Deleted

## 2017-08-09 ENCOUNTER — Other Ambulatory Visit (INDEPENDENT_AMBULATORY_CARE_PROVIDER_SITE_OTHER): Payer: Medicare Other

## 2017-08-09 ENCOUNTER — Telehealth: Payer: Self-pay | Admitting: *Deleted

## 2017-08-09 DIAGNOSIS — D500 Iron deficiency anemia secondary to blood loss (chronic): Secondary | ICD-10-CM | POA: Diagnosis not present

## 2017-08-09 DIAGNOSIS — K31819 Angiodysplasia of stomach and duodenum without bleeding: Secondary | ICD-10-CM | POA: Diagnosis not present

## 2017-08-09 DIAGNOSIS — I780 Hereditary hemorrhagic telangiectasia: Secondary | ICD-10-CM | POA: Diagnosis not present

## 2017-08-09 LAB — CBC WITH DIFFERENTIAL/PLATELET
ABS IMMATURE GRANULOCYTES: 0 10*3/uL (ref 0.0–0.1)
BASOS ABS: 0 10*3/uL (ref 0.0–0.1)
BASOS PCT: 1 %
EOS ABS: 0.3 10*3/uL (ref 0.0–0.7)
Eosinophils Relative: 5 %
HCT: 28.9 % — ABNORMAL LOW (ref 36.0–46.0)
Hemoglobin: 8.6 g/dL — ABNORMAL LOW (ref 12.0–15.0)
IMMATURE GRANULOCYTES: 1 %
Lymphocytes Relative: 11 %
Lymphs Abs: 0.6 10*3/uL — ABNORMAL LOW (ref 0.7–4.0)
MCH: 30 pg (ref 26.0–34.0)
MCHC: 29.8 g/dL — ABNORMAL LOW (ref 30.0–36.0)
MCV: 100.7 fL — ABNORMAL HIGH (ref 78.0–100.0)
MONOS PCT: 9 %
Monocytes Absolute: 0.5 10*3/uL (ref 0.1–1.0)
NEUTROS ABS: 4.2 10*3/uL (ref 1.7–7.7)
NEUTROS PCT: 73 %
PLATELETS: 161 10*3/uL (ref 150–400)
RBC: 2.87 MIL/uL — ABNORMAL LOW (ref 3.87–5.11)
RDW: 16.1 % — AB (ref 11.5–15.5)
WBC: 5.7 10*3/uL (ref 4.0–10.5)

## 2017-08-09 LAB — SAMPLE TO BLOOD BANK

## 2017-08-09 NOTE — Telephone Encounter (Signed)
Called pt - talked to Mr Garguilo; informed of pt's lab result "blood count OK - will probably need blood transfusion next week - not this week" per Dr [name]. Informed Hgb is 8.6 from 9.4 from last week. Stated he will let her know.

## 2017-08-09 NOTE — Telephone Encounter (Signed)
-----   Message from [name], MD sent at 08/09/2017  1:38 PM EDT ----- Call pt: blood count OK - will probably need blood transfusion next week - not this week

## 2017-08-16 ENCOUNTER — Other Ambulatory Visit (INDEPENDENT_AMBULATORY_CARE_PROVIDER_SITE_OTHER): Payer: Medicare Other

## 2017-08-16 ENCOUNTER — Other Ambulatory Visit: Payer: Self-pay | Admitting: Internal Medicine

## 2017-08-16 ENCOUNTER — Other Ambulatory Visit: Payer: Self-pay | Admitting: Oncology

## 2017-08-16 DIAGNOSIS — I780 Hereditary hemorrhagic telangiectasia: Secondary | ICD-10-CM

## 2017-08-16 DIAGNOSIS — L439 Lichen planus, unspecified: Secondary | ICD-10-CM

## 2017-08-16 DIAGNOSIS — D500 Iron deficiency anemia secondary to blood loss (chronic): Secondary | ICD-10-CM | POA: Diagnosis not present

## 2017-08-16 DIAGNOSIS — K31819 Angiodysplasia of stomach and duodenum without bleeding: Secondary | ICD-10-CM

## 2017-08-16 LAB — SAMPLE TO BLOOD BANK

## 2017-08-16 LAB — CBC WITH DIFFERENTIAL/PLATELET
ABS IMMATURE GRANULOCYTES: 0 10*3/uL (ref 0.0–0.1)
BASOS ABS: 0 10*3/uL (ref 0.0–0.1)
Basophils Relative: 0 %
Eosinophils Absolute: 0.3 10*3/uL (ref 0.0–0.7)
Eosinophils Relative: 6 %
HEMATOCRIT: 29.3 % — AB (ref 36.0–46.0)
HEMOGLOBIN: 9.3 g/dL — AB (ref 12.0–15.0)
IMMATURE GRANULOCYTES: 0 %
LYMPHS ABS: 0.4 10*3/uL — AB (ref 0.7–4.0)
LYMPHS PCT: 8 %
MCH: 31.5 pg (ref 26.0–34.0)
MCHC: 31.7 g/dL (ref 30.0–36.0)
MCV: 99.3 fL (ref 78.0–100.0)
Monocytes Absolute: 0.4 10*3/uL (ref 0.1–1.0)
Monocytes Relative: 9 %
NEUTROS ABS: 3.4 10*3/uL (ref 1.7–7.7)
Neutrophils Relative %: 77 %
Platelets: 148 10*3/uL — ABNORMAL LOW (ref 150–400)
RBC: 2.95 MIL/uL — AB (ref 3.87–5.11)
RDW: 15.9 % — ABNORMAL HIGH (ref 11.5–15.5)
WBC: 4.5 10*3/uL (ref 4.0–10.5)

## 2017-08-17 NOTE — Telephone Encounter (Signed)
Will refill once, but should probably see in clinic before we refill again to follow up on rash.

## 2017-08-18 ENCOUNTER — Telehealth: Payer: Self-pay | Admitting: Cardiology

## 2017-08-18 NOTE — Telephone Encounter (Signed)
New message   1) Are you calling to confirm a diagnosis or obtain personal health information (Y/N)? YES  If so, what information is requested? EF%, Hayden Rasmussen [phone]  PHONE [phone] EXT [phone] LAUREN  Please route to Medical Records or your medical records site representative

## 2017-08-18 NOTE — Telephone Encounter (Signed)
Left VM with [place] Representative. Asked her to fax over EF% form made my fax number available.

## 2017-08-19 ENCOUNTER — Other Ambulatory Visit: Payer: Self-pay | Admitting: Internal Medicine

## 2017-08-23 ENCOUNTER — Other Ambulatory Visit: Payer: Self-pay | Admitting: Oncology

## 2017-08-23 ENCOUNTER — Other Ambulatory Visit (INDEPENDENT_AMBULATORY_CARE_PROVIDER_SITE_OTHER): Payer: Medicare Other

## 2017-08-23 ENCOUNTER — Telehealth: Payer: Self-pay | Admitting: *Deleted

## 2017-08-23 DIAGNOSIS — D500 Iron deficiency anemia secondary to blood loss (chronic): Secondary | ICD-10-CM

## 2017-08-23 DIAGNOSIS — I780 Hereditary hemorrhagic telangiectasia: Secondary | ICD-10-CM

## 2017-08-23 DIAGNOSIS — K31819 Angiodysplasia of stomach and duodenum without bleeding: Secondary | ICD-10-CM

## 2017-08-23 DIAGNOSIS — D649 Anemia, unspecified: Secondary | ICD-10-CM

## 2017-08-23 LAB — CBC WITH DIFFERENTIAL/PLATELET
ABS IMMATURE GRANULOCYTES: 0 10*3/uL (ref 0.0–0.1)
Basophils Absolute: 0 10*3/uL (ref 0.0–0.1)
Basophils Relative: 1 %
Eosinophils Absolute: 0.2 10*3/uL (ref 0.0–0.7)
Eosinophils Relative: 6 %
HEMATOCRIT: 26.2 % — AB (ref 36.0–46.0)
HEMOGLOBIN: 8.1 g/dL — AB (ref 12.0–15.0)
IMMATURE GRANULOCYTES: 0 %
LYMPHS ABS: 0.3 10*3/uL — AB (ref 0.7–4.0)
LYMPHS PCT: 8 %
MCH: 31.9 pg (ref 26.0–34.0)
MCHC: 30.9 g/dL (ref 30.0–36.0)
MCV: 103.1 fL — ABNORMAL HIGH (ref 78.0–100.0)
Monocytes Absolute: 0.3 10*3/uL (ref 0.1–1.0)
Monocytes Relative: 8 %
NEUTROS ABS: 3.1 10*3/uL (ref 1.7–7.7)
NEUTROS PCT: 77 %
Platelets: 138 10*3/uL — ABNORMAL LOW (ref 150–400)
RBC: 2.54 MIL/uL — AB (ref 3.87–5.11)
RDW: 15.9 % — ABNORMAL HIGH (ref 11.5–15.5)
WBC: 4 10*3/uL (ref 4.0–10.5)

## 2017-08-23 LAB — SAMPLE TO BLOOD BANK

## 2017-08-23 LAB — FERRITIN: Ferritin: 48 ng/mL (ref 11–307)

## 2017-08-23 NOTE — Telephone Encounter (Signed)
-----   Message from [name], MD sent at 08/23/2017 12:58 PM EDT ----- [name] - let's try to get her a transfusion bed for this week - any day; I will put in orders

## 2017-08-23 NOTE — Telephone Encounter (Signed)
Called / talked to pt - informed Hgb down to 8.1 and will need blood transfusion per Dr [name].  Stated she will be unable to come tomorrow but can come Friday. Talked to [place] - appt scheduled Friday 7/19 @ 0800 AM . Damaris Schooner to pt again - informed of appt on Friday; and will need to be type and screen again. Pt understands.

## 2017-08-24 ENCOUNTER — Encounter (HOSPITAL_COMMUNITY): Payer: Medicare Other

## 2017-08-26 ENCOUNTER — Other Ambulatory Visit: Payer: Self-pay | Admitting: Oncology

## 2017-08-26 DIAGNOSIS — I780 Hereditary hemorrhagic telangiectasia: Secondary | ICD-10-CM

## 2017-08-26 DIAGNOSIS — D500 Iron deficiency anemia secondary to blood loss (chronic): Secondary | ICD-10-CM

## 2017-08-27 ENCOUNTER — Ambulatory Visit (HOSPITAL_COMMUNITY)
Admission: RE | Admit: 2017-08-27 | Discharge: 2017-08-27 | Disposition: A | Discharge status: Home / Self Care | Payer: Medicare Other | Source: Ambulatory Visit | Attending: Oncology | Admitting: Oncology

## 2017-08-27 DIAGNOSIS — D649 Anemia, unspecified: Secondary | ICD-10-CM

## 2017-08-27 DIAGNOSIS — I780 Hereditary hemorrhagic telangiectasia: Secondary | ICD-10-CM

## 2017-08-27 DIAGNOSIS — D500 Iron deficiency anemia secondary to blood loss (chronic): Secondary | ICD-10-CM | POA: Diagnosis present

## 2017-08-27 DIAGNOSIS — K31819 Angiodysplasia of stomach and duodenum without bleeding: Secondary | ICD-10-CM

## 2017-08-27 LAB — PREPARE RBC (CROSSMATCH)

## 2017-08-27 MED ORDER — SODIUM CHLORIDE 0.9% IV SOLUTION
Freq: Once | INTRAVENOUS | Status: AC
  Administered 2017-08-27: 08:00:00 via INTRAVENOUS

## 2017-08-27 MED ORDER — SODIUM CHLORIDE 0.9 % IV SOLN: 250.0000 mL | Freq: Once | INTRAVENOUS | Status: DC

## 2017-08-27 MED ORDER — HEPARIN SOD (PORK) LOCK FLUSH 100 UNIT/ML IV SOLN
INTRAVENOUS | Status: AC
  Administered 2017-08-27: 500 [IU]
  Filled 2017-08-27: qty 5

## 2017-08-28 LAB — TYPE AND SCREEN
ABO/RH(D): A POS
Antibody Screen: NEGATIVE
UNIT DIVISION: 0
Unit division: 0

## 2017-08-28 LAB — BPAM RBC
BLOOD PRODUCT EXPIRATION DATE: 201907262359
Blood Product Expiration Date: 201907262359
ISSUE DATE / TIME: 201907191201
ISSUE DATE / TIME: 201907190924
UNIT TYPE AND RH: 6200
Unit Type and Rh: 6200

## 2017-08-30 ENCOUNTER — Telehealth: Payer: Self-pay | Admitting: *Deleted

## 2017-08-30 ENCOUNTER — Other Ambulatory Visit (INDEPENDENT_AMBULATORY_CARE_PROVIDER_SITE_OTHER): Payer: Medicare Other

## 2017-08-30 DIAGNOSIS — K31819 Angiodysplasia of stomach and duodenum without bleeding: Secondary | ICD-10-CM | POA: Diagnosis not present

## 2017-08-30 DIAGNOSIS — D500 Iron deficiency anemia secondary to blood loss (chronic): Secondary | ICD-10-CM | POA: Diagnosis not present

## 2017-08-30 DIAGNOSIS — I780 Hereditary hemorrhagic telangiectasia: Secondary | ICD-10-CM

## 2017-08-30 LAB — CBC WITH DIFFERENTIAL/PLATELET
Abs Immature Granulocytes: 0 10*3/uL (ref 0.0–0.1)
Basophils Absolute: 0 10*3/uL (ref 0.0–0.1)
Basophils Relative: 1 %
EOS ABS: 0.3 10*3/uL (ref 0.0–0.7)
Eosinophils Relative: 5 %
HCT: 34.5 % — ABNORMAL LOW (ref 36.0–46.0)
Hemoglobin: 11 g/dL — ABNORMAL LOW (ref 12.0–15.0)
IMMATURE GRANULOCYTES: 0 %
Lymphocytes Relative: 8 %
Lymphs Abs: 0.5 10*3/uL — ABNORMAL LOW (ref 0.7–4.0)
MCH: 31.5 pg (ref 26.0–34.0)
MCHC: 31.9 g/dL (ref 30.0–36.0)
MCV: 98.9 fL (ref 78.0–100.0)
Monocytes Absolute: 0.5 10*3/uL (ref 0.1–1.0)
Monocytes Relative: 8 %
NEUTROS PCT: 78 %
Neutro Abs: 4.6 10*3/uL (ref 1.7–7.7)
PLATELETS: 150 10*3/uL (ref 150–400)
RBC: 3.49 MIL/uL — ABNORMAL LOW (ref 3.87–5.11)
RDW: 16.9 % — AB (ref 11.5–15.5)
WBC: 5.8 10*3/uL (ref 4.0–10.5)

## 2017-08-30 LAB — SAMPLE TO BLOOD BANK

## 2017-08-30 NOTE — Telephone Encounter (Signed)
-----   Message from [name], MD sent at 08/30/2017 11:53 AM EDT ----- Call: blood count great today - see u next week

## 2017-08-30 NOTE — Telephone Encounter (Signed)
Called pt - talked to [name] informed pt's "blood count great today - see u next week " per Dr [name]. Stated sounds good and will let her know; stated she's doing ok.

## 2017-09-06 ENCOUNTER — Telehealth: Payer: Self-pay | Admitting: *Deleted

## 2017-09-06 ENCOUNTER — Other Ambulatory Visit (INDEPENDENT_AMBULATORY_CARE_PROVIDER_SITE_OTHER): Payer: Medicare Other

## 2017-09-06 ENCOUNTER — Encounter: Payer: Self-pay | Admitting: Student-PharmD

## 2017-09-06 DIAGNOSIS — D500 Iron deficiency anemia secondary to blood loss (chronic): Secondary | ICD-10-CM | POA: Diagnosis not present

## 2017-09-06 DIAGNOSIS — I780 Hereditary hemorrhagic telangiectasia: Secondary | ICD-10-CM

## 2017-09-06 DIAGNOSIS — K31819 Angiodysplasia of stomach and duodenum without bleeding: Secondary | ICD-10-CM | POA: Diagnosis not present

## 2017-09-06 LAB — SAMPLE TO BLOOD BANK

## 2017-09-06 LAB — CBC WITH DIFFERENTIAL/PLATELET
ABS IMMATURE GRANULOCYTES: 0 10*3/uL (ref 0.0–0.1)
BASOS PCT: 1 %
Basophils Absolute: 0 10*3/uL (ref 0.0–0.1)
Eosinophils Absolute: 0.3 10*3/uL (ref 0.0–0.7)
Eosinophils Relative: 6 %
HCT: 32.1 % — ABNORMAL LOW (ref 36.0–46.0)
Hemoglobin: 10.1 g/dL — ABNORMAL LOW (ref 12.0–15.0)
Immature Granulocytes: 0 %
Lymphocytes Relative: 9 %
Lymphs Abs: 0.5 10*3/uL — ABNORMAL LOW (ref 0.7–4.0)
MCH: 31.3 pg (ref 26.0–34.0)
MCHC: 31.5 g/dL (ref 30.0–36.0)
MCV: 99.4 fL (ref 78.0–100.0)
MONO ABS: 0.5 10*3/uL (ref 0.1–1.0)
Monocytes Relative: 9 %
NEUTROS ABS: 4 10*3/uL (ref 1.7–7.7)
Neutrophils Relative %: 75 %
Platelets: 182 10*3/uL (ref 150–400)
RBC: 3.23 MIL/uL — ABNORMAL LOW (ref 3.87–5.11)
RDW: 14.9 % (ref 11.5–15.5)
WBC: 5.3 10*3/uL (ref 4.0–10.5)

## 2017-09-06 NOTE — Progress Notes (Signed)
S: [name] is a 77 y.o. female reports to clinical pharmacist appointment for diabetes medication management. Patient is accompanied by husband, who assists at home with medication management. Patient brought home glucometer which showed a wide range of readings (50s-300s). Patient notes she is only taking Lantus (insulin glargine) 28 U in the morning.   Prior to Admission medications   Medication Sig Start Date End Date Taking? Authorizing Provider  b complex vitamins capsule Take 1 capsule by mouth daily. 10/26/16   Alphonzo Grieve, MD  Blood Glucose Monitoring Suppl (ONETOUCH VERIO) w/Device KIT  07/07/16   [provider]  calcium citrate-vitamin D (CITRACAL+D) 315-200 MG-UNIT tablet Take 1 tablet by mouth 2 (two) times daily. 12/31/15 04/01/17  Ledell Noss, MD  cetaphil (CETAPHIL) lotion Apply 1 application topically as needed for dry skin. 02/18/16   Rice, Resa Miner, MD  donepezil (ARICEPT) 10 MG tablet Take 1 tablet (10 mg total) by mouth at bedtime. 11/10/16   Melvenia Beam, MD  Elastic Bandages & Supports ([place]) MISC Wear as much as possible while awake to reduce swelling 04/27/16   Minus Liberty, MD  FERROCITE 324 MG TABS tablet TAKE ONE TABLET BY MOUTH TWICE DAILY 09/15/16   [name], DO  furosemide (LASIX) 20 MG tablet TAKE 1 TABLET BY MOUTH ONCE DAILY 05/19/17   Lyda Jester M, PA-C  glucose blood (ONETOUCH VERIO) test strip Use as instructed 3-4 times daily. E11.29, insulin requiring 03/18/17   [name], DO  Insulin Glargine (LANTUS) 100 UNIT/ML Solostar Pen Inject 28 Units into the skin daily. 07/15/17   Lacroce, Hulen Shouts, MD  insulin lispro (HUMALOG) 100 UNIT/ML KiwkPen Take 10 units before eating breakfast, lunch and dinner (3 times per day) 03/18/17   [name], DO  Insulin Pen Needle (B-D UF III MINI PEN NEEDLES) 31G X 5 MM MISC USE TO INJECT INSULIN 3 TIMES DAILY 07/06/17   [name], DO  lactulose (CHRONULAC)  10 GM/15ML solution Take 15 mLs (10 g total) by mouth 2 (two) times daily. 08/23/15   [name], DO  levETIRAcetam (KEPPRA) 500 MG tablet Take 1 tablet (500 mg total) by mouth 2 (two) times daily. 11/10/16   Melvenia Beam, MD  memantine (NAMENDA) 10 MG tablet Take 1 tablet (10 mg total) by mouth 2 (two) times daily. 11/10/16   Melvenia Beam, MD  [facility] DELICA LANCETS FINE MISC Check blood sugar up to 3 times a day 12/27/15   Sid Falcon, MD  [name] VERIO test strip USE AS INSTRUCTED 3 TO 4 TIMES DAILY 07/14/17   [name], DO  oxymetazoline (AFRIN) 0.05 % nasal spray Place 1 spray into both nostrils 2 (two) times daily as needed (Epistaxis). 10/05/13   Robbie Lis, MD  pantoprazole (PROTONIX) 40 MG tablet Take 1 tablet (40 mg total) by mouth 2 (two) times daily. 07/07/14   Hosie Poisson, MD  potassium chloride (K-DUR) 10 MEQ tablet Take 1 tablet (10 mEq total) by mouth daily. 04/12/17   [name], MD  potassium chloride SA (KLOR-CON M20) 20 MEQ tablet TAKE 1 TABLET BY MOUTH ONCE DAILY 09/17/16   Lyda Jester M, PA-C  pramipexole (MIRAPEX) 0.125 MG tablet TAKE 1 TABLET BY MOUTH ONCE DAILY 08/20/17   Aldine Contes, MD  sodium chloride (OCEAN) 0.65 % SOLN nasal spray Place 1 spray into both nostrils as needed for congestion. 07/30/16   [name], DO  triamcinolone ointment (KENALOG) 0.5 % APPLY 1 APPLICATION  TOPICALLY TO RASH TWICE DAILY FOR ITCHING 08/17/17   Axel Filler, MD    A/P: T2DM - Initiated Xultophy at 16 U insulin degludec/ 0.58 mg liraglutide to help stabilize sugars - Discontinued patient's Lantus (insulin glargine)  Patient advised to follow up in 1 week, after next lab visit for further medication titration.   The patient and husband verbalized understanding of information provided by repeating back concepts discussed.   20 minutes spent face-to-face with the patient during the encounter. 100% of time spent on education.  Danella Penton,  PharmD Candidate

## 2017-09-06 NOTE — Progress Notes (Signed)
Patient was seen in clinic with Danella Penton, PharmD candidate. I agree with the assessment and plan of care documented.

## 2017-09-06 NOTE — Telephone Encounter (Signed)
-----   Message from [name], MD sent at 09/06/2017 11:46 AM EDT ----- Call pt: Hb good; no need for blood this week

## 2017-09-06 NOTE — Progress Notes (Signed)
Information only

## 2017-09-06 NOTE — Telephone Encounter (Signed)
Called pt - talked to pt's husband; informed "Hb good; no need for blood this week" per Dr Sanjuana Letters.  Stated great news and he will let her know.

## 2017-09-08 ENCOUNTER — Telehealth: Payer: Self-pay

## 2017-09-08 NOTE — Telephone Encounter (Signed)
Requesting to speak with a nurse about insulin is not working and vomiting. Please call pt back.

## 2017-09-08 NOTE — Telephone Encounter (Signed)
Have called 2x, cannot leave a message because vmail is not set up

## 2017-09-09 ENCOUNTER — Encounter: Payer: Self-pay | Admitting: Internal Medicine

## 2017-09-09 ENCOUNTER — Ambulatory Visit (INDEPENDENT_AMBULATORY_CARE_PROVIDER_SITE_OTHER): Payer: Medicare Other | Admitting: Internal Medicine

## 2017-09-09 ENCOUNTER — Other Ambulatory Visit: Payer: Self-pay

## 2017-09-09 DIAGNOSIS — E1129 Type 2 diabetes mellitus with other diabetic kidney complication: Secondary | ICD-10-CM

## 2017-09-09 DIAGNOSIS — I780 Hereditary hemorrhagic telangiectasia: Secondary | ICD-10-CM

## 2017-09-09 DIAGNOSIS — Z794 Long term (current) use of insulin: Secondary | ICD-10-CM

## 2017-09-09 DIAGNOSIS — R112 Nausea with vomiting, unspecified: Secondary | ICD-10-CM | POA: Diagnosis not present

## 2017-09-09 DIAGNOSIS — E1169 Type 2 diabetes mellitus with other specified complication: Secondary | ICD-10-CM

## 2017-09-09 DIAGNOSIS — N289 Disorder of kidney and ureter, unspecified: Secondary | ICD-10-CM

## 2017-09-09 DIAGNOSIS — IMO0002 Reserved for concepts with insufficient information to code with codable children: Secondary | ICD-10-CM

## 2017-09-09 DIAGNOSIS — E1165 Type 2 diabetes mellitus with hyperglycemia: Principal | ICD-10-CM

## 2017-09-09 DIAGNOSIS — Z9889 Other specified postprocedural states: Secondary | ICD-10-CM

## 2017-09-09 LAB — GLUCOSE, CAPILLARY: Glucose-Capillary: 148 mg/dL — ABNORMAL HIGH (ref 70–99)

## 2017-09-09 NOTE — Telephone Encounter (Signed)
Pt has appt 8/1

## 2017-09-09 NOTE — Progress Notes (Signed)
   CC: Nausea, vomiting   HPI:  [name] is a 77 y.o. female with past medical history outlined below here for nausea and vomiting. For the details of today's visit, please refer to the assessment and plan.  Past Medical History:  Diagnosis Date  . Chronic anemia   . Chronic diastolic CHF (congestive heart failure) ([place]) 10/03/2013  . Chronic GI bleeding    [name] 11/29/2014  . Family history of anesthesia complication    "niece has a hard time coming out" (09/15/2012)  . Frequent nosebleeds    chronic  . Gastric AV malformation    [name] 11/29/2014  . GERD (gastroesophageal reflux disease)   . 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 History of blood transfusion "several"  . History of epistaxis   . HTN (hypertension), benign 03/02/2012  . Hyperlipidemia   . Iron deficiency anemia    chronic infusions"  . Lichen planus    Both lower extremities  . 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 Pneumonia 1990's X 2  . Pulmonary HTN ([place]) 04/01/2017   PASP 74mmHg on echo 09/2016  . Seizures ([place]) 09/2014  . Telangiectasia    Gastric   . 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    Review of Systems  Constitutional: Negative for fever.  Gastrointestinal: Positive for nausea and vomiting. Negative for abdominal pain.    Physical Exam:  Vitals:   09/09/17 1038  BP: 140/66  Pulse: (!) 57  Temp: 98.1 F (36.7 C)  TempSrc: Oral  SpO2: 96%  Weight: 160 lb (72.6 kg)  Height: 5\' 3"  (1.6 m)   Constitutional: NAD, appears comfortable Cardiovascular: RRR, no murmurs, rubs, or gallops.  Pulmonary/Chest: CTAB, no wheezes, rales, or rhonchi.  Abdominal: Soft, non tender, non distended. +BS.  Psychiatric: Normal mood and affect  Assessment & Plan:   See Encounters Tab for problem based charting.  Patient discussed with Dr. Daryll Drown

## 2017-09-09 NOTE — Patient Instructions (Addendum)
FOLLOW-UP INSTRUCTIONS When: 1 week For: Diabetes What to bring: Meter  Ms. Pinho,  It was a pleasure to see you today. I am sorry to hear about your nausea and vomiting with the new medications. Since it was only one episode, and you are currently feeling better, I would like to continue the medication for now and watch you closely. There is a change this could resolve as you get used to the mediation. However, if the nausea persists, we may have to change your medication. Please follow back up in 1 week or sooner if you have any issues. If you have any questions or concerns, call our clinic at [phone] or after hours call [phone] and ask for the internal medicine resident on call. Thank you!  - Dr. Philipp Ovens

## 2017-09-09 NOTE — Assessment & Plan Note (Signed)
Patient is here with complaint of nausea and vomiting after recent medication change.  She was seen by our clinic pharmacist 3 days ago and her Lantus was changed to [employer].  On chart review, it appears she has had issues with a wide range of fluctuating CBGs, often with hypoglycemic episodes in the morning (50s-60s) and elevated readings in the afternoon and evenings (>300).  Her last A1c checked 2 months ago was 5.9.  However given her history of HHT and frequent blood transfusions, this may be falsely low.  On 7/29, her Lantus 28 units qAM was discontinued and she was started on Xultophy 16 units qAM instead.  Patient reports starting the medication that day.  That evening prior to bed, she developed nausea, upset stomach, and experienced one episode of nonbloody emesis.  She has continued to take the medication, and denies any further episodes of N/V since.  CBG today in clinic is 148.  On review of her glucometer today, since changing to [facility], she has not had any further hypoglycemic episodes.  Her CBG readings over the past 3 days range from 157 - 312.  Given that her symptoms were transient, she is agreeable to continue the medicine for now.  If nausea persist, she may be unable to tolerate GLP-1 agonist and require medication change.  We will have to monitor symptoms closely if up titration is required.  -- Continue Xultophy 16 units qAM for now -- Monitor for symptoms of nausea, vomiting (common with GLP-1 agonist). If persistent, will likely need to discontinue -- Careful up titration if tolerated  -- Follow up one week

## 2017-09-13 ENCOUNTER — Ambulatory Visit: Payer: Medicare Other | Admitting: Pharmacist

## 2017-09-13 ENCOUNTER — Encounter: Payer: Self-pay | Admitting: Internal Medicine

## 2017-09-13 ENCOUNTER — Other Ambulatory Visit: Payer: Self-pay

## 2017-09-13 ENCOUNTER — Other Ambulatory Visit: Payer: Medicare Other

## 2017-09-13 ENCOUNTER — Telehealth: Payer: Self-pay | Admitting: *Deleted

## 2017-09-13 ENCOUNTER — Ambulatory Visit (INDEPENDENT_AMBULATORY_CARE_PROVIDER_SITE_OTHER): Payer: Medicare Other | Admitting: Internal Medicine

## 2017-09-13 VITALS — BP 145/51 | HR 67 | Temp 98.2°F | Ht 63.0 in | Wt 160.0 lb

## 2017-09-13 DIAGNOSIS — IMO0002 Reserved for concepts with insufficient information to code with codable children: Secondary | ICD-10-CM

## 2017-09-13 DIAGNOSIS — M79605 Pain in left leg: Secondary | ICD-10-CM

## 2017-09-13 DIAGNOSIS — R252 Cramp and spasm: Secondary | ICD-10-CM

## 2017-09-13 DIAGNOSIS — E1165 Type 2 diabetes mellitus with hyperglycemia: Principal | ICD-10-CM

## 2017-09-13 DIAGNOSIS — I780 Hereditary hemorrhagic telangiectasia: Secondary | ICD-10-CM

## 2017-09-13 DIAGNOSIS — Z794 Long term (current) use of insulin: Secondary | ICD-10-CM

## 2017-09-13 DIAGNOSIS — N289 Disorder of kidney and ureter, unspecified: Secondary | ICD-10-CM

## 2017-09-13 DIAGNOSIS — E1151 Type 2 diabetes mellitus with diabetic peripheral angiopathy without gangrene: Secondary | ICD-10-CM | POA: Diagnosis not present

## 2017-09-13 DIAGNOSIS — R112 Nausea with vomiting, unspecified: Secondary | ICD-10-CM | POA: Diagnosis not present

## 2017-09-13 DIAGNOSIS — I739 Peripheral vascular disease, unspecified: Secondary | ICD-10-CM

## 2017-09-13 DIAGNOSIS — M79604 Pain in right leg: Secondary | ICD-10-CM

## 2017-09-13 DIAGNOSIS — E118 Type 2 diabetes mellitus with unspecified complications: Secondary | ICD-10-CM | POA: Diagnosis not present

## 2017-09-13 DIAGNOSIS — E1129 Type 2 diabetes mellitus with other diabetic kidney complication: Secondary | ICD-10-CM

## 2017-09-13 LAB — CBC WITH DIFFERENTIAL/PLATELET
Abs Immature Granulocytes: 0 10*3/uL (ref 0.0–0.1)
BASOS ABS: 0 10*3/uL (ref 0.0–0.1)
BASOS PCT: 1 %
Eosinophils Absolute: 0.3 10*3/uL (ref 0.0–0.7)
Eosinophils Relative: 6 %
HCT: 33 % — ABNORMAL LOW (ref 36.0–46.0)
Hemoglobin: 10.5 g/dL — ABNORMAL LOW (ref 12.0–15.0)
Immature Granulocytes: 0 %
Lymphocytes Relative: 10 %
Lymphs Abs: 0.5 10*3/uL — ABNORMAL LOW (ref 0.7–4.0)
MCH: 31.2 pg (ref 26.0–34.0)
MCHC: 31.8 g/dL (ref 30.0–36.0)
MCV: 97.9 fL (ref 78.0–100.0)
MONO ABS: 0.5 10*3/uL (ref 0.1–1.0)
Monocytes Relative: 9 %
NEUTROS ABS: 3.8 10*3/uL (ref 1.7–7.7)
Neutrophils Relative %: 74 %
Platelets: 172 10*3/uL (ref 150–400)
RBC: 3.37 MIL/uL — ABNORMAL LOW (ref 3.87–5.11)
RDW: 14 % (ref 11.5–15.5)
WBC: 5.2 10*3/uL (ref 4.0–10.5)

## 2017-09-13 LAB — SAMPLE TO BLOOD BANK

## 2017-09-13 LAB — GLUCOSE, CAPILLARY: GLUCOSE-CAPILLARY: 142 mg/dL — AB (ref 70–99)

## 2017-09-13 MED ORDER — INSULIN DEGLUDEC-LIRAGLUTIDE 100-3.6 UNIT-MG/ML ~~LOC~~ SOPN: 20.0000 [IU] | PEN_INJECTOR | SUBCUTANEOUS | 1 refills | Status: DC

## 2017-09-13 NOTE — Assessment & Plan Note (Signed)
HHT: Hgb stable CBC today showed Hgb of 10.5 from 10.1 (09/06/2017)

## 2017-09-13 NOTE — Progress Notes (Signed)
Internal Medicine Clinic Attending  I saw and evaluated the patient.  I personally confirmed the key portions of the history and exam documented by Dr. Agyei and I reviewed pertinent patient test results.  The assessment, diagnosis, and plan were formulated together and I agree with the documentation in the resident's note.  

## 2017-09-13 NOTE — Telephone Encounter (Signed)
-----   Message from [name], MD sent at 09/13/2017 10:47 AM EDT ----- Call pt: blood count good. No transfusion this week

## 2017-09-13 NOTE — Progress Notes (Signed)
   CC: Follow up Nausea and vomiting after starting new medication.   HPI:  [name] is a 77 y.o. woman with past medical history as listed below here for follow-up nausea and vomiting after starting Xultophy.   Nausea and Vomiting: She denies any subsequent episodes of nausea and vomiting since last visit on 09/09/2017.  She actually attributes the nausea vomiting to food poisoning.  She endorses good oral intake and denies any fevers or chills.  Type 2 diabetes mellitus-uncontrolled: She was recently started on Xultophy 16U qAM on 09/06/2017.  Since starting the medication home blood glucose has still been consistently elevated with range 140s-390s.  The plan is to increase Xultophy from 16U qAM to 20U qAM with continuous blood glucose monitoring.  Peripheral arterial disease: She has a past history of PAD and in 2018 had an ABI.  She was referred to vascular surgery at [facility] but was unable to follow-up.  Today I have placed a new outpatient referral to vascular surgery.  She has a history of HHT and is thus not a candidate for aspirin therapy.   HHT: Hgb stable CBC today showed Hgb of 10.5 from 10.1 (09/06/2017)  Past Medical History:  Diagnosis Date  . Chronic anemia   . Chronic diastolic CHF (congestive heart failure) ([place]) 10/03/2013  . Chronic GI bleeding    [name] 11/29/2014  . Family history of anesthesia complication    "niece has a hard time coming out" (09/15/2012)  . Frequent nosebleeds    chronic  . Gastric AV malformation    [name] 11/29/2014  . GERD (gastroesophageal reflux disease)   . 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 History of blood transfusion "several"  . History of epistaxis   . HTN (hypertension), benign 03/02/2012  . Hyperlipidemia   . Iron deficiency anemia    chronic infusions"  . Lichen planus    Both lower extremities  . 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 Pneumonia 1990's X 2  . Pulmonary HTN ([place])  04/01/2017   PASP 47mmHg on echo 09/2016  . Seizures ([place]) 09/2014  . Telangiectasia    Gastric   . 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   Review of Systems: As per HPI  Physical Exam:  Vitals:   09/13/17 0957  BP: (!) 145/51  Pulse: 67  Temp: 98.2 F (36.8 C)  TempSrc: Oral  SpO2: 100%  Weight: 160 lb (72.6 kg)  Height: 5\' 3"  (1.6 m)   Const: In NAD, sitting in wheelchair  CV: RRR, no MGR Resp: CTABL Ext: Trace edema  Assessment & Plan:   See Encounters Tab for problem based charting.  Patient discussed with Dr. Dareen Piano.

## 2017-09-13 NOTE — Telephone Encounter (Signed)
Pt called / informed "blood count good (@10 .5). No transfusion this week" per Dr [name]. "Thank the Lord".

## 2017-09-13 NOTE — Assessment & Plan Note (Signed)
Peripheral arterial disease: She has a past history of PAD and in 2018 had an ABI.  She was referred to vascular surgery at [facility] but was unable to follow-up.  Today I have placed a new outpatient referral to vascular surgery.  She has a history of HHT and is thus not a candidate for aspirin therapy.

## 2017-09-13 NOTE — Patient Instructions (Signed)
Ms. Bjorklund,  It was a pleasure taking care of you here at the clinic today.  He here are what we addressed at a visit today.  1.  Your blood sugar has still been high even with the new medication.  I am changing the Xultophy from 16 units to 20 units in the morning.  Continue to monitor your home blood glucose 2.  For the leg cramps that you have been having, I have referred you to vascular surgery for further management. 3.  Your hemoglobin is stable from the blood work that we did today.  ~~Dr. Eileen Stanford

## 2017-09-13 NOTE — Assessment & Plan Note (Signed)
Type 2 diabetes mellitus-uncontrolled: She was recently started on Xultophy 16U qAM on 09/06/2017.  Since starting the medication home blood glucose has still been consistently elevated with range 140s-390s.  The plan is to increase Xultophy from 16U qAM to 20U qAM with continuous blood glucose monitoring.

## 2017-09-14 NOTE — Progress Notes (Signed)
Internal Medicine Clinic Attending  Case discussed with Dr. Guilloud at the time of the visit.  We reviewed the resident's history and exam and pertinent patient test results.  I agree with the assessment, diagnosis, and plan of care documented in the resident's note.  

## 2017-09-14 NOTE — Addendum Note (Signed)
Addended by: Hulan Fray on: 09/14/2017 07:52 AM   Modules accepted: Orders

## 2017-09-20 ENCOUNTER — Other Ambulatory Visit (INDEPENDENT_AMBULATORY_CARE_PROVIDER_SITE_OTHER): Payer: Medicare Other

## 2017-09-20 ENCOUNTER — Other Ambulatory Visit: Payer: Self-pay | Admitting: Dietician

## 2017-09-20 DIAGNOSIS — E1129 Type 2 diabetes mellitus with other diabetic kidney complication: Secondary | ICD-10-CM

## 2017-09-20 DIAGNOSIS — K31819 Angiodysplasia of stomach and duodenum without bleeding: Secondary | ICD-10-CM

## 2017-09-20 DIAGNOSIS — I780 Hereditary hemorrhagic telangiectasia: Secondary | ICD-10-CM | POA: Diagnosis not present

## 2017-09-20 DIAGNOSIS — E1165 Type 2 diabetes mellitus with hyperglycemia: Principal | ICD-10-CM

## 2017-09-20 DIAGNOSIS — D500 Iron deficiency anemia secondary to blood loss (chronic): Secondary | ICD-10-CM

## 2017-09-20 DIAGNOSIS — IMO0002 Reserved for concepts with insufficient information to code with codable children: Secondary | ICD-10-CM

## 2017-09-20 DIAGNOSIS — E1121 Type 2 diabetes mellitus with diabetic nephropathy: Secondary | ICD-10-CM

## 2017-09-20 LAB — CBC WITH DIFFERENTIAL/PLATELET
ABS IMMATURE GRANULOCYTES: 0 10*3/uL (ref 0.0–0.1)
BASOS ABS: 0 10*3/uL (ref 0.0–0.1)
BASOS PCT: 1 %
Eosinophils Absolute: 0.3 10*3/uL (ref 0.0–0.7)
Eosinophils Relative: 6 %
HCT: 34.7 % — ABNORMAL LOW (ref 36.0–46.0)
Hemoglobin: 11.2 g/dL — ABNORMAL LOW (ref 12.0–15.0)
IMMATURE GRANULOCYTES: 0 %
Lymphocytes Relative: 11 %
Lymphs Abs: 0.6 10*3/uL — ABNORMAL LOW (ref 0.7–4.0)
MCH: 31.2 pg (ref 26.0–34.0)
MCHC: 32.3 g/dL (ref 30.0–36.0)
MCV: 96.7 fL (ref 78.0–100.0)
Monocytes Absolute: 0.4 10*3/uL (ref 0.1–1.0)
Monocytes Relative: 8 %
NEUTROS ABS: 4.1 10*3/uL (ref 1.7–7.7)
NEUTROS PCT: 74 %
PLATELETS: 191 10*3/uL (ref 150–400)
RBC: 3.59 MIL/uL — AB (ref 3.87–5.11)
RDW: 13.5 % (ref 11.5–15.5)
WBC: 5.5 10*3/uL (ref 4.0–10.5)

## 2017-09-20 LAB — SAMPLE TO BLOOD BANK

## 2017-09-20 LAB — FERRITIN: Ferritin: 27 ng/mL (ref 11–307)

## 2017-09-20 MED ORDER — INSULIN DEGLUDEC-LIRAGLUTIDE 100-3.6 UNIT-MG/ML ~~LOC~~ SOPN: 20.0000 [IU] | PEN_INJECTOR | SUBCUTANEOUS | 1 refills | Status: DC

## 2017-09-20 MED ORDER — GLUCOSE BLOOD VI STRP: ORAL_STRIP | 4 refills | Status: DC

## 2017-09-20 MED ORDER — ONETOUCH DELICA LANCETS FINE MISC: 4 refills | Status: DC

## 2017-09-21 ENCOUNTER — Telehealth: Payer: Self-pay | Admitting: Dietician

## 2017-09-21 ENCOUNTER — Telehealth: Payer: Self-pay | Admitting: *Deleted

## 2017-09-21 NOTE — Telephone Encounter (Signed)
-----   Message from [name], MD sent at 09/20/2017  1:14 PM EDT ----- Call pt: blood count good. No transfusion.

## 2017-09-21 NOTE — Telephone Encounter (Signed)
Pt called / informed  "blood count good (@ 11.2). No transfusion." per Dr [name]. Pt excited;"keep praying".

## 2017-09-21 NOTE — Telephone Encounter (Signed)
[name] showed me her Xultophy pen yesterday and it did not have very much medicine left in it. She may need a sample if the PA cannot be done today or tomorrow at the latest. spoke with gladys who has not seen a PA. Will call [facility] to request one again.

## 2017-09-21 NOTE — Telephone Encounter (Signed)
Requested PA again from [place]. They are faxing it to two different fax machines.

## 2017-09-21 NOTE — Telephone Encounter (Signed)
[place] stated [name] needs a PA which was sent yesterday. I will ask [name] to f/u.

## 2017-09-21 NOTE — Telephone Encounter (Signed)
Information was sent through CoverMyMeds for PA for Xultophy.  Approved thru 02/08/2018.  Sander Nephew, RN 09/21/2017 14:58 .

## 2017-09-21 NOTE — Telephone Encounter (Signed)
Called and talked to pt's husband - informed by pick up sample if needed; stated ok.

## 2017-09-26 ENCOUNTER — Other Ambulatory Visit: Payer: Self-pay | Admitting: Internal Medicine

## 2017-09-27 ENCOUNTER — Other Ambulatory Visit (INDEPENDENT_AMBULATORY_CARE_PROVIDER_SITE_OTHER): Payer: Medicare Other

## 2017-09-27 DIAGNOSIS — I780 Hereditary hemorrhagic telangiectasia: Secondary | ICD-10-CM | POA: Diagnosis not present

## 2017-09-27 LAB — CBC WITH DIFFERENTIAL/PLATELET
Abs Immature Granulocytes: 0 10*3/uL (ref 0.0–0.1)
Basophils Absolute: 0 10*3/uL (ref 0.0–0.1)
Basophils Relative: 0 %
Eosinophils Absolute: 0.2 10*3/uL (ref 0.0–0.7)
Eosinophils Relative: 2 %
HEMATOCRIT: 29.9 % — AB (ref 36.0–46.0)
HEMOGLOBIN: 9.8 g/dL — AB (ref 12.0–15.0)
Immature Granulocytes: 0 %
LYMPHS ABS: 0.6 10*3/uL — AB (ref 0.7–4.0)
LYMPHS PCT: 6 %
MCH: 31.4 pg (ref 26.0–34.0)
MCHC: 32.8 g/dL (ref 30.0–36.0)
MCV: 95.8 fL (ref 78.0–100.0)
MONO ABS: 0.5 10*3/uL (ref 0.1–1.0)
MONOS PCT: 6 %
Neutro Abs: 7.7 10*3/uL (ref 1.7–7.7)
Neutrophils Relative %: 86 %
Platelets: 166 10*3/uL (ref 150–400)
RBC: 3.12 MIL/uL — ABNORMAL LOW (ref 3.87–5.11)
RDW: 12.7 % (ref 11.5–15.5)
WBC: 9 10*3/uL (ref 4.0–10.5)

## 2017-09-27 LAB — SAMPLE TO BLOOD BANK

## 2017-09-27 NOTE — Telephone Encounter (Signed)
Pls sch Nov F/U with PVP (+/- one month to help with shc). DM F/U

## 2017-09-28 ENCOUNTER — Telehealth: Payer: Self-pay | Admitting: *Deleted

## 2017-09-28 NOTE — Telephone Encounter (Signed)
Called pt - talked to pt's husband; informed "Hb OK (@ 9.8) , no blood this week " per Dr [name]. Stated he will let her know and thanks for calling.

## 2017-09-28 NOTE — Telephone Encounter (Signed)
-----   Message from [name], MD sent at 09/27/2017  1:35 PM EDT ----- Call pt: Hb OK, no blood this week

## 2017-10-04 ENCOUNTER — Encounter: Payer: Self-pay | Admitting: *Deleted

## 2017-10-04 ENCOUNTER — Ambulatory Visit (INDEPENDENT_AMBULATORY_CARE_PROVIDER_SITE_OTHER): Payer: Medicare Other | Admitting: Internal Medicine

## 2017-10-04 ENCOUNTER — Ambulatory Visit: Payer: Medicare Other | Admitting: Pharmacist

## 2017-10-04 ENCOUNTER — Telehealth: Payer: Self-pay | Admitting: *Deleted

## 2017-10-04 ENCOUNTER — Other Ambulatory Visit (INDEPENDENT_AMBULATORY_CARE_PROVIDER_SITE_OTHER): Payer: Medicare Other

## 2017-10-04 ENCOUNTER — Other Ambulatory Visit: Payer: Self-pay

## 2017-10-04 VITALS — BP 131/46 | HR 51 | Temp 98.5°F | Ht 63.0 in | Wt 154.7 lb

## 2017-10-04 DIAGNOSIS — E1129 Type 2 diabetes mellitus with other diabetic kidney complication: Secondary | ICD-10-CM

## 2017-10-04 DIAGNOSIS — I780 Hereditary hemorrhagic telangiectasia: Secondary | ICD-10-CM

## 2017-10-04 DIAGNOSIS — N182 Chronic kidney disease, stage 2 (mild): Secondary | ICD-10-CM | POA: Diagnosis not present

## 2017-10-04 DIAGNOSIS — E1122 Type 2 diabetes mellitus with diabetic chronic kidney disease: Secondary | ICD-10-CM | POA: Diagnosis not present

## 2017-10-04 DIAGNOSIS — Z8719 Personal history of other diseases of the digestive system: Secondary | ICD-10-CM

## 2017-10-04 DIAGNOSIS — R1010 Upper abdominal pain, unspecified: Secondary | ICD-10-CM | POA: Diagnosis not present

## 2017-10-04 DIAGNOSIS — E1165 Type 2 diabetes mellitus with hyperglycemia: Principal | ICD-10-CM

## 2017-10-04 DIAGNOSIS — I480 Paroxysmal atrial fibrillation: Secondary | ICD-10-CM

## 2017-10-04 DIAGNOSIS — IMO0002 Reserved for concepts with insufficient information to code with codable children: Secondary | ICD-10-CM

## 2017-10-04 LAB — CBC WITH DIFFERENTIAL/PLATELET
Abs Immature Granulocytes: 0 10*3/uL (ref 0.0–0.1)
BASOS ABS: 0 10*3/uL (ref 0.0–0.1)
Basophils Relative: 1 %
EOS PCT: 5 %
Eosinophils Absolute: 0.2 10*3/uL (ref 0.0–0.7)
HCT: 26.6 % — ABNORMAL LOW (ref 36.0–46.0)
HEMOGLOBIN: 8.6 g/dL — AB (ref 12.0–15.0)
Immature Granulocytes: 0 %
LYMPHS PCT: 10 %
Lymphs Abs: 0.5 10*3/uL — ABNORMAL LOW (ref 0.7–4.0)
MCH: 30.9 pg (ref 26.0–34.0)
MCHC: 32.3 g/dL (ref 30.0–36.0)
MCV: 95.7 fL (ref 78.0–100.0)
Monocytes Absolute: 0.4 10*3/uL (ref 0.1–1.0)
Monocytes Relative: 7 %
NEUTROS ABS: 4 10*3/uL (ref 1.7–7.7)
Neutrophils Relative %: 77 %
Platelets: 180 10*3/uL (ref 150–400)
RBC: 2.78 MIL/uL — AB (ref 3.87–5.11)
RDW: 12.6 % (ref 11.5–15.5)
WBC: 5.1 10*3/uL (ref 4.0–10.5)

## 2017-10-04 LAB — SAMPLE TO BLOOD BANK

## 2017-10-04 NOTE — Progress Notes (Signed)
   CC: Abdominal pain  HPI:  [name] is a 77 y.o. diabetes mellitus type 2, ckd2, hx of gastric avm, paroxysmal atrial fibrillation who presents for abdominal pain. Please see problem based charting for evaluation, assessment, and plan.   Past Medical History:  Diagnosis Date  . Chronic anemia   . Chronic diastolic CHF (congestive heart failure) ([place]) 10/03/2013  . Chronic GI bleeding    [name] 11/29/2014  . Family history of anesthesia complication    "niece has a hard time coming out" (09/15/2012)  . Frequent nosebleeds    chronic  . Gastric AV malformation    [name] 11/29/2014  . GERD (gastroesophageal reflux disease)   . 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 History of blood transfusion "several"  . History of epistaxis   . HTN (hypertension), benign 03/02/2012  . Hyperlipidemia   . Iron deficiency anemia    chronic infusions"  . Lichen planus    Both lower extremities  . 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 Overgrown toenails 03/18/2017  . Pneumonia 1990's X 2  . Pulmonary HTN ([place]) 04/01/2017   PASP 42mmHg on echo 09/2016  . Seizures ([place]) 09/2014  . Symptomatic anemia 11/29/2014  . Telangiectasia    Gastric   . 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   Review of Systems:    Denies nausea, vomiting Has abdominal pain, black stools, fatigue, weakness  Physical Exam:  Vitals:   10/04/17 1359  BP: (!) 131/46  Pulse: (!) 51  Temp: 98.5 F (36.9 C)  TempSrc: Oral  SpO2: 97%  Weight: 154 lb 11.2 oz (70.2 kg)  Height: 5\' 3"  (1.6 m)   Physical Exam  Constitutional: She appears well-developed and well-nourished. No distress.  HENT:  Head: Normocephalic and atraumatic.  Eyes: Conjunctivae are normal.  Cardiovascular: Normal rate, regular rhythm and normal heart sounds.  Respiratory: Effort normal and breath sounds normal. No respiratory distress. She has no wheezes.  GI: Soft. Bowel sounds are  normal. She exhibits no distension. There is tenderness (Her abdomen particularly in the epigastric region).  Musculoskeletal: She exhibits no edema.  Neurological: She is alert.  Skin: She is not diaphoretic. No erythema.  Psychiatric: She has a normal mood and affect. Her behavior is normal. Judgment and thought content normal.     Assessment & Plan:   See Encounters Tab for problem based charting.  Patient discussed with Dr. Lynnae January

## 2017-10-04 NOTE — Patient Instructions (Addendum)
It was a pleasure to see you today [name]. Please make the following changes:  -Please call and make an appointment with Dr. Watt Climes -Please avoid nsaids (ibuprofen) and alcohol  If you have any questions or concerns, please call our clinic at [phone] between 9am-5pm and after hours call [phone] and ask for the internal medicine resident on call. If you feel you are having a medical emergency please call 911.   Thank you, we look forward to help you remain healthy!  [name] Mage, MD Internal Medicine PGY2

## 2017-10-04 NOTE — Telephone Encounter (Addendum)
Call pt: no blood this week but will need next week; [name]- see if we can book a bed at short stay for next week - I will be out of town; remind pt: we are closed next Monday - come in early on Tuesday for lab.   Pt was here for an appt along with her husband. Informed Hgb 8.6 and no blood transfusion this week. I have scheduled an appt [facility] Short Stay next week Wed 9/4 @ 0800 AM.

## 2017-10-04 NOTE — Progress Notes (Signed)
S: [name] is a 77 y.o. female reports to clinical pharmacist appointment for diabetes medication management. Patient is accompanied by husband, who assist at home with medication management. Patient brought home glucometer.  Allergies  Allergen Reactions  . Aspirin Nausea And Vomiting   Medication Sig  b complex vitamins capsule Take 1 capsule by mouth daily.  Blood Glucose Monitoring Suppl (ONETOUCH VERIO) w/Device KIT   calcium citrate-vitamin D (CITRACAL+D) 315-200 MG-UNIT tablet Take 1 tablet by mouth 2 (two) times daily.  cetaphil (CETAPHIL) lotion Apply 1 application topically as needed for dry skin.  donepezil (ARICEPT) 10 MG tablet Take 1 tablet (10 mg total) by mouth at bedtime.  Elastic Bandages & Supports (MEDICAL COMPRESSION STOCKINGS) MISC Wear as much as possible while awake to reduce swelling  FERROCITE 324 MG TABS tablet TAKE 1 TABLET BY MOUTH TWICE DAILY  furosemide (LASIX) 20 MG tablet TAKE 1 TABLET BY MOUTH ONCE DAILY  glucose blood (ONETOUCH VERIO) test strip Use as instructed 3-4 times daily. E11.29, insulin requiring  glucose blood (ONETOUCH VERIO) test strip Check blood sugar 4 times a day  Insulin Degludec-Liraglutide 100-3.6 UNIT-MG/ML SOPN Inject 20 Units into the skin every morning. Notify physician if you start experiencing signs of hypoglycemia  Insulin Pen Needle (B-D UF III MINI PEN NEEDLES) 31G X 5 MM MISC USE TO INJECT INSULIN 3 TIMES DAILY  lactulose (CHRONULAC) 10 GM/15ML solution Take 15 mLs (10 g total) by mouth 2 (two) times daily.  levETIRAcetam (KEPPRA) 500 MG tablet Take 1 tablet (500 mg total) by mouth 2 (two) times daily.  memantine (NAMENDA) 10 MG tablet Take 1 tablet (10 mg total) by mouth 2 (two) times daily.  ONETOUCH DELICA LANCETS FINE MISC Check blood sugar up to 4 times a day  oxymetazoline (AFRIN) 0.05 % nasal spray Place 1 spray into both nostrils 2 (two) times daily as needed (Epistaxis).  pantoprazole (PROTONIX) 40 MG tablet Take  1 tablet (40 mg total) by mouth 2 (two) times daily.  potassium chloride (K-DUR) 10 MEQ tablet Take 1 tablet (10 mEq total) by mouth daily.  potassium chloride SA (KLOR-CON M20) 20 MEQ tablet TAKE 1 TABLET BY MOUTH ONCE DAILY  pramipexole (MIRAPEX) 0.125 MG tablet TAKE 1 TABLET BY MOUTH ONCE DAILY  sodium chloride (OCEAN) 0.65 % SOLN nasal spray Place 1 spray into both nostrils as needed for congestion.  triamcinolone ointment (KENALOG) 0.5 % APPLY 1 APPLICATION TOPICALLY TO RASH TWICE DAILY FOR ITCHING   Past Medical History:  Diagnosis Date  . Chronic anemia   . Chronic diastolic CHF (congestive heart failure) ([place]) 10/03/2013  . Chronic GI bleeding    [name] 11/29/2014  . Family history of anesthesia complication    "niece has a hard time coming out" (09/15/2012)  . Frequent nosebleeds    chronic  . Gastric AV malformation    [name] 11/29/2014  . GERD (gastroesophageal reflux disease)   . 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 History of blood transfusion "several"  . History of epistaxis   . HTN (hypertension), benign 03/02/2012  . Hyperlipidemia   . Iron deficiency anemia    chronic infusions"  . Lichen planus    Both lower extremities  . 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 Overgrown toenails 03/18/2017  . Pneumonia 1990's X 2  . Pulmonary HTN ([place]) 04/01/2017   PASP 38mHg on echo 09/2016  . [place] 09[place]  . Symptomatic anemia 11/29/2014  .  Telangiectasia    Gastric   . 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   Social History   Socioeconomic History  . Marital status: Married    Spouse name: [name]  . Number of children: 0  . Years of education: 9  . Highest education level: Not on file  Occupational History  . Occupation: Retired Systems developer: RETIRED  Social Needs  . Financial resource strain: Not on file  . Food insecurity:    Worry: Not on file    Inability: Not on file  .  Transportation needs:    Medical: Not on file    Non-medical: Not on file  Tobacco Use  . Smoking status: Former Smoker    Packs/day: 1.00    Years: 20.00    Pack years: 20.00    Types: Cigarettes    Last attempt to quit: 02/10/1971    Years since quitting: 46.6  . Smokeless tobacco: Never Used  . Tobacco comment: 09/15/2012 "smoked 50-60 yr ago"  Substance and Sexual Activity  . Alcohol use: No    Alcohol/week: 0.0 standard drinks  . Drug use: No  . Sexual activity: Not on file  Lifestyle  . Physical activity:    Days per week: Not on file    Minutes per session: Not on file  . Stress: Not on file  Relationships  . Social connections:    Talks on phone: Not on file    Gets together: Not on file    Attends religious service: Not on file    Active member of club or organization: Not on file    Attends meetings of clubs or organizations: Not on file    Relationship status: Not on file  Other Topics Concern  . Not on file  Social History Narrative   Married, lives with husband, [name].  Ambulates without assistance.     Caffeine use: none   Family History  Problem Relation Age of Onset  . Stroke Father   . Breast cancer Unknown   . Malignant hyperthermia Neg Hx   . Seizures Neg Hx    O:    Component Value Date/Time   CHOL 141 11/29/2014 1026   HDL 31 (L) 11/29/2014 1026   TRIG 141 11/29/2014 1026   AST 21 01/14/2016 1025   AST 30 04/04/2014 1019   ALT 15 01/14/2016 1025   ALT 25 04/04/2014 1019   NA 144 04/21/2017 1152   NA 144 04/04/2014 1019   K 4.1 04/21/2017 1152   K 4.5 04/04/2014 1019   CL 106 04/21/2017 1152   CL 111 (H) 12/28/2011 0825   CO2 24 04/21/2017 1152   CO2 26 04/04/2014 1019   GLUCOSE 83 04/21/2017 1152   GLUCOSE 180 (H) 10/26/2016 1115   GLUCOSE 156 (H) 04/04/2014 1019   GLUCOSE 152 (H) 12/28/2011 0825   HGBA1C 5.9 (A) 07/15/2017 1127   HGBA1C 7.3 (H) 09/05/2014 0720   BUN 17 04/21/2017 1152   BUN 16.9 04/04/2014 1019   CREATININE 1.35  (H) 04/21/2017 1152   CREATININE 0.92 01/28/2015 0811   CREATININE 0.9 04/04/2014 1019   CALCIUM 9.5 04/21/2017 1152   CALCIUM 9.3 04/04/2014 1019   GFRNONAA 38 (L) 04/21/2017 1152   GFRAA 44 (L) 04/21/2017 1152   WBC 5.1 10/04/2017 1120   HGB 8.6 (L) 10/04/2017 1120   HGB 9.7 (L) 03/13/2015 1059   HCT 26.6 (L) 10/04/2017 1120   HCT 29.5 (L) 03/13/2015  1059   PLT 180 10/04/2017 1120   PLT 140 (L) 03/13/2015 1059   PLT 237 11/09/2014 1547   TSH 1.730 01/30/2015 1216   Ht Readings from Last 2 Encounters:  09/13/17 '5\' 3"'$  (1.6 m)  09/09/17 '5\' 3"'$  (1.6 m)   Wt Readings from Last 2 Encounters:  09/13/17 160 lb (72.6 kg)  09/09/17 160 lb (72.6 kg)   There is no height or weight on file to calculate BMI. BP Readings from Last 3 Encounters:  09/13/17 (!) 145/51  09/09/17 140/66  08/27/17 (!) 170/78   A/P: T2DM -Patient reported using 22 units daily of Xultophy -Avg. BG from 09/05/17-10/03/17 was 236 with 25% within target -Only 1 episode of hypoglycemia -Patient reports GI upset but does not attribute it to the [facility]- she is following up with doctor this afternoon -Patient's BG has trended down as Xultophy dose has been titrated over last month, but it is still not at goal so will increase Xultophy to 24 units daily   The patient verbalized understanding of information provided by repeating back concepts discussed. Patient will follow up in 1 week.

## 2017-10-05 DIAGNOSIS — R1010 Upper abdominal pain, unspecified: Secondary | ICD-10-CM | POA: Insufficient documentation

## 2017-10-05 LAB — CMP14 + ANION GAP
ALBUMIN: 3.5 g/dL (ref 3.5–4.8)
ALT: 17 IU/L (ref 0–32)
AST: 23 IU/L (ref 0–40)
Albumin/Globulin Ratio: 1.6 (ref 1.2–2.2)
Alkaline Phosphatase: 77 IU/L (ref 39–117)
Anion Gap: 14 mmol/L (ref 10.0–18.0)
BUN / CREAT RATIO: 17 (ref 12–28)
BUN: 18 mg/dL (ref 8–27)
Bilirubin Total: 0.3 mg/dL (ref 0.0–1.2)
CO2: 22 mmol/L (ref 20–29)
Calcium: 10.8 mg/dL — ABNORMAL HIGH (ref 8.7–10.3)
Chloride: 105 mmol/L (ref 96–106)
Creatinine, Ser: 1.09 mg/dL — ABNORMAL HIGH (ref 0.57–1.00)
GFR calc non Af Amer: 49 mL/min/{1.73_m2} — ABNORMAL LOW (ref >59)
GFR, EST AFRICAN AMERICAN: 57 mL/min/{1.73_m2} — AB (ref >59)
Globulin, Total: 2.2 g/dL (ref 1.5–4.5)
Glucose: 145 mg/dL — ABNORMAL HIGH (ref 65–99)
Potassium: 4.8 mmol/L (ref 3.5–5.2)
Sodium: 141 mmol/L (ref 134–144)
TOTAL PROTEIN: 5.7 g/dL — AB (ref 6.0–8.5)

## 2017-10-05 NOTE — Assessment & Plan Note (Addendum)
The patient presents with 3-4 month history of upper abdominal pain. She describes the abdominal pain as 6/10 intensity, throbbing in nature, constantly present, and non-radiating. She does not have any nausea/vominting and does not note any changes in intensity of the pain after eating. She states that she has a feeling of being bloated, has had black-colored stools for the past 3 months. The patient has not been able to sleep at night due to the stomach pain and has been putting pillows under her back which does not help.  She has not taken any NSAIDs or drink alcohol recently.  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 Her last EGD was done in August 2017 which showed a single diminutive nonbleeding AVM in the esophagus, multiple small nonbleeding angiodysplastic lesions in various portions of the stomach, and a small angiodysplastic lesion in the duodenal bulb.  The angiodysplastic lesions and AVMs were treated with argon plasma coagulation.  Assessment and plan  On examination of the patient she had upper abdominal tenderness to palpation, with marked tenderness when palpating the epigastric region.  We will work-up the patient's pain for peptic ulcer disease vs pancreatitis vs cholangitic process.  -Ordered CMP, CBC -The patient to continue taking pantoprazole 40 mg twice daily -We will refer the patient to GI (Dr. Daisey Must) to evaluate for possible AVM rebleeding -So the patient to avoid alcohol and NSAIDs

## 2017-10-05 NOTE — Progress Notes (Signed)
Patient was seen in clinic with [facility] and [name], PharmD candidates. I agree with the assessment and plan of care documented.

## 2017-10-05 NOTE — Progress Notes (Signed)
Internal Medicine Clinic Attending  Case discussed with Dr. Maricela Bo at the time of the visit.  We reviewed the resident's history and exam and pertinent patient test results.  I agree with the assessment, diagnosis, and plan of care documented in the resident's note. Dr Darnell Level is arranging PRBC transfusion next week.

## 2017-10-09 ENCOUNTER — Other Ambulatory Visit: Payer: Self-pay | Admitting: Oncology

## 2017-10-09 DIAGNOSIS — D500 Iron deficiency anemia secondary to blood loss (chronic): Secondary | ICD-10-CM

## 2017-10-09 DIAGNOSIS — I780 Hereditary hemorrhagic telangiectasia: Secondary | ICD-10-CM

## 2017-10-09 DIAGNOSIS — K31819 Angiodysplasia of stomach and duodenum without bleeding: Secondary | ICD-10-CM

## 2017-10-12 ENCOUNTER — Encounter: Payer: Self-pay | Admitting: Internal Medicine

## 2017-10-12 ENCOUNTER — Ambulatory Visit: Payer: Medicare Other | Admitting: Pharmacist

## 2017-10-12 ENCOUNTER — Other Ambulatory Visit: Payer: Self-pay

## 2017-10-12 ENCOUNTER — Telehealth: Payer: Self-pay | Admitting: *Deleted

## 2017-10-12 ENCOUNTER — Other Ambulatory Visit (INDEPENDENT_AMBULATORY_CARE_PROVIDER_SITE_OTHER): Payer: Medicare Other

## 2017-10-12 ENCOUNTER — Ambulatory Visit (INDEPENDENT_AMBULATORY_CARE_PROVIDER_SITE_OTHER): Payer: Medicare Other | Admitting: Internal Medicine

## 2017-10-12 ENCOUNTER — Other Ambulatory Visit (HOSPITAL_COMMUNITY): Payer: Self-pay | Admitting: *Deleted

## 2017-10-12 DIAGNOSIS — IMO0002 Reserved for concepts with insufficient information to code with codable children: Secondary | ICD-10-CM

## 2017-10-12 DIAGNOSIS — K5520 Angiodysplasia of colon without hemorrhage: Secondary | ICD-10-CM | POA: Diagnosis not present

## 2017-10-12 DIAGNOSIS — R011 Cardiac murmur, unspecified: Secondary | ICD-10-CM | POA: Diagnosis not present

## 2017-10-12 DIAGNOSIS — I780 Hereditary hemorrhagic telangiectasia: Secondary | ICD-10-CM

## 2017-10-12 DIAGNOSIS — D649 Anemia, unspecified: Secondary | ICD-10-CM

## 2017-10-12 DIAGNOSIS — E1165 Type 2 diabetes mellitus with hyperglycemia: Principal | ICD-10-CM

## 2017-10-12 DIAGNOSIS — E1129 Type 2 diabetes mellitus with other diabetic kidney complication: Secondary | ICD-10-CM

## 2017-10-12 DIAGNOSIS — Z9889 Other specified postprocedural states: Secondary | ICD-10-CM

## 2017-10-12 LAB — CBC WITH DIFFERENTIAL/PLATELET
Abs Immature Granulocytes: 0 10*3/uL (ref 0.0–0.1)
Basophils Absolute: 0 10*3/uL (ref 0.0–0.1)
Basophils Relative: 0 %
EOS PCT: 2 %
Eosinophils Absolute: 0.1 10*3/uL (ref 0.0–0.7)
HEMATOCRIT: 24.6 % — AB (ref 36.0–46.0)
HEMOGLOBIN: 7.3 g/dL — AB (ref 12.0–15.0)
Immature Granulocytes: 0 %
LYMPHS ABS: 0.4 10*3/uL — AB (ref 0.7–4.0)
Lymphocytes Relative: 8 %
MCH: 30.2 pg (ref 26.0–34.0)
MCHC: 29.7 g/dL — AB (ref 30.0–36.0)
MCV: 101.7 fL — AB (ref 78.0–100.0)
MONO ABS: 0.4 10*3/uL (ref 0.1–1.0)
MONOS PCT: 8 %
NEUTROS PCT: 82 %
Neutro Abs: 3.8 10*3/uL (ref 1.7–7.7)
Platelets: 165 10*3/uL (ref 150–400)
RBC: 2.42 MIL/uL — ABNORMAL LOW (ref 3.87–5.11)
RDW: 15.1 % (ref 11.5–15.5)
WBC: 4.7 10*3/uL (ref 4.0–10.5)

## 2017-10-12 LAB — SAMPLE TO BLOOD BANK

## 2017-10-12 NOTE — Patient Instructions (Signed)
Thank you for coming to the clinic today. It was a pleasure to see you, Im sorry you are not feeling well.   Your diarrhea is probably from you loosing blood through those small blood vessels in your bowel. Today we found that your hemoglobin is low so you will definitely need to make it to that appointment for blood tomorrow morning. If you have chest pain, difficulty breathing or suddenly begin to have many bowel movements that are black or bright red you will need to come back to the emergency department tonight. Please call our phone number if you have any questions for [place] before your appointment tomorrow morning.   FOLLOW-UP INSTRUCTIONS When: 1 week in the acute care clinic  For: recheck of your hemoglobin ( red blood cell level)  What to bring: all of your medication bottles   Please call the internal medicine center clinic if you have any questions or concerns, we may be able to help and keep you from a long and expensive emergency room wait. Our clinic and after hours phone number is [phone], the best time to call is Monday through Friday 9 am to 4 pm but there is always someone available 24/7 if you have an emergency. If you need medication refills please notify your pharmacy one week in advance and they will send [place] a request.

## 2017-10-12 NOTE — Progress Notes (Signed)
Labs

## 2017-10-12 NOTE — Telephone Encounter (Signed)
WALK IN Pt comes to see dr Maudie Mercury today and have labs done, she is c/o abd pain and diarrhea x 2 weeks, states she feels weak and tired, she is worried about everything. Pt desires to be seen this pm. cbg average recently mid 200's

## 2017-10-12 NOTE — Telephone Encounter (Signed)
Thank you. We will evaluate her

## 2017-10-12 NOTE — Progress Notes (Signed)
S: [name] is a 77 y.o. female reports to clinical pharmacist appointment for diabetes management. Patient is accompanied by husband, who assist at home with medication management.  Allergies  Allergen Reactions  . Aspirin Nausea And Vomiting   Prior to Admission medications   Medication Sig Start Date End Date Taking? Authorizing Provider  b complex vitamins capsule Take 1 capsule by mouth daily. 10/26/16   Alphonzo Grieve, MD  Blood Glucose Monitoring Suppl (ONETOUCH VERIO) w/Device KIT  07/07/16   [provider]  calcium citrate-vitamin D (CITRACAL+D) 315-200 MG-UNIT tablet Take 1 tablet by mouth 2 (two) times daily. 12/31/15 04/01/17  Ledell Noss, MD  cetaphil (CETAPHIL) lotion Apply 1 application topically as needed for dry skin. 02/18/16   Rice, Resa Miner, MD  donepezil (ARICEPT) 10 MG tablet Take 1 tablet (10 mg total) by mouth at bedtime. 11/10/16   Melvenia Beam, MD  Elastic Bandages & Supports ([place]) MISC Wear as much as possible while awake to reduce swelling 04/27/16   Minus Liberty, MD  FERROCITE 324 MG TABS tablet TAKE 1 TABLET BY MOUTH TWICE DAILY 09/27/17   Bartholomew Crews, MD  furosemide (LASIX) 20 MG tablet TAKE 1 TABLET BY MOUTH ONCE DAILY 05/19/17   Lyda Jester M, PA-C  glucose blood (ONETOUCH VERIO) test strip Use as instructed 3-4 times daily. E11.29, insulin requiring 03/18/17   [name], DO  glucose blood [facility] VERIO) test strip Check blood sugar 4 times a day 09/20/17   Bartholomew Crews, MD  Insulin Degludec-Liraglutide 100-3.6 UNIT-MG/ML SOPN Inject 20 Units into the skin every morning. Notify physician if you start experiencing signs of hypoglycemia 09/20/17   Bartholomew Crews, MD  Insulin Pen Needle (B-D UF III MINI PEN NEEDLES) 31G X 5 MM MISC USE TO INJECT INSULIN 3 TIMES DAILY 07/06/17   [name], DO  lactulose (CHRONULAC) 10 GM/15ML solution Take 15 mLs (10 g total) by mouth 2 (two)  times daily. 08/23/15   [name], DO  levETIRAcetam (KEPPRA) 500 MG tablet Take 1 tablet (500 mg total) by mouth 2 (two) times daily. 11/10/16   Melvenia Beam, MD  memantine (NAMENDA) 10 MG tablet Take 1 tablet (10 mg total) by mouth 2 (two) times daily. 11/10/16   Melvenia Beam, MD  [facility] DELICA LANCETS FINE MISC Check blood sugar up to 4 times a day 09/20/17   Bartholomew Crews, MD  oxymetazoline (AFRIN) 0.05 % nasal spray Place 1 spray into both nostrils 2 (two) times daily as needed (Epistaxis). 10/05/13   Robbie Lis, MD  pantoprazole (PROTONIX) 40 MG tablet Take 1 tablet (40 mg total) by mouth 2 (two) times daily. 07/07/14   Hosie Poisson, MD  potassium chloride (K-DUR) 10 MEQ tablet Take 1 tablet (10 mEq total) by mouth daily. 04/12/17   [name], MD  potassium chloride SA (KLOR-CON M20) 20 MEQ tablet TAKE 1 TABLET BY MOUTH ONCE DAILY 09/17/16   Lyda Jester M, PA-C  pramipexole (MIRAPEX) 0.125 MG tablet TAKE 1 TABLET BY MOUTH ONCE DAILY 08/20/17   Aldine Contes, MD  sodium chloride (OCEAN) 0.65 % SOLN nasal spray Place 1 spray into both nostrils as needed for congestion. 07/30/16   [name], DO  triamcinolone ointment (KENALOG) 0.5 % APPLY 1 APPLICATION TOPICALLY TO RASH TWICE DAILY FOR ITCHING 08/17/17   Axel Filler, MD   Past Medical History:  Diagnosis Date  . Chronic anemia   . Chronic diastolic  CHF (congestive heart failure) ([place]) 10/03/2013  . Chronic GI bleeding    [name] 11/29/2014  . Family history of anesthesia complication    "niece has a hard time coming out" (09/15/2012)  . Frequent nosebleeds    chronic  . Gastric AV malformation    [name] 11/29/2014  . GERD (gastroesophageal reflux disease)   . 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 History of blood transfusion "several"  . History of epistaxis   . HTN (hypertension), benign 03/02/2012  . Hyperlipidemia   . Iron deficiency anemia    chronic infusions"  .  Lichen planus    Both lower extremities  . 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 Overgrown toenails 03/18/2017  . Pneumonia 1990's X 2  . Pulmonary HTN ([place]) 04/01/2017   PASP 24mHg on echo 09/2016  . [place] 09[place]  . Symptomatic anemia 11/29/2014  . Telangiectasia    Gastric   . 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   Social History   Socioeconomic History  . Marital status: Married    Spouse name: JJenny Reichmann . Number of children: 0  . Years of education: 9  . Highest education level: Not on file  Occupational History  . Occupation: Retired tSystems developer RETIRED  Social Needs  . Financial resource strain: Not on file  . Food insecurity:    Worry: Not on file    Inability: Not on file  . Transportation needs:    Medical: Not on file    Non-medical: Not on file  Tobacco Use  . Smoking status: Former Smoker    Packs/day: 1.00    Years: 20.00    Pack years: 20.00    Types: Cigarettes    Last attempt to quit: 02/10/1971    Years since quitting: 46.7  . Smokeless tobacco: Never Used  . Tobacco comment: 09/15/2012 "smoked 50-60 yr ago"  Substance and Sexual Activity  . Alcohol use: No    Alcohol/week: 0.0 standard drinks  . Drug use: No  . Sexual activity: Not on file  Lifestyle  . Physical activity:    Days per week: Not on file    Minutes per session: Not on file  . Stress: Not on file  Relationships  . Social connections:    Talks on phone: Not on file    Gets together: Not on file    Attends religious service: Not on file    Active member of club or organization: Not on file    Attends meetings of clubs or organizations: Not on file    Relationship status: Not on file  Other Topics Concern  . Not on file  Social History Narrative   Married, lives with husband, JJenny Reichmann  Ambulates without assistance.     Caffeine use: none   Family History  Problem Relation Age of Onset  . Stroke Father   . Breast cancer  Unknown   . Malignant hyperthermia Neg Hx   . Seizures Neg Hx     O:    Component Value Date/Time   CHOL 141 11/29/2014 1026   HDL 31 (L) 11/29/2014 1026   TRIG 141 11/29/2014 1026   AST 23 10/04/2017 1120   AST 30 04/04/2014 1019   ALT 17 10/04/2017 1120   ALT 25 04/04/2014 1019   NA 141 10/04/2017 1120   NA 144 04/04/2014 1019   K 4.8 10/04/2017 1120  K 4.5 04/04/2014 1019   CL 105 10/04/2017 1120   CL 111 (H) 12/28/2011 0825   CO2 22 10/04/2017 1120   CO2 26 04/04/2014 1019   GLUCOSE 145 (H) 10/04/2017 1120   GLUCOSE 180 (H) 10/26/2016 1115   GLUCOSE 156 (H) 04/04/2014 1019   GLUCOSE 152 (H) 12/28/2011 0825   HGBA1C 5.9 (A) 07/15/2017 1127   HGBA1C 7.3 (H) 09/05/2014 0720   BUN 18 10/04/2017 1120   BUN 16.9 04/04/2014 1019   CREATININE 1.09 (H) 10/04/2017 1120   CREATININE 0.92 01/28/2015 0811   CREATININE 0.9 04/04/2014 1019   CALCIUM 10.8 (H) 10/04/2017 1120   CALCIUM 9.3 04/04/2014 1019   GFRNONAA 49 (L) 10/04/2017 1120   GFRAA 57 (L) 10/04/2017 1120   WBC 5.1 10/04/2017 1120   HGB 8.6 (L) 10/04/2017 1120   HGB 9.7 (L) 03/13/2015 1059   HCT 26.6 (L) 10/04/2017 1120   HCT 29.5 (L) 03/13/2015 1059   PLT 180 10/04/2017 1120   PLT 140 (L) 03/13/2015 1059   PLT 237 11/09/2014 1547   TSH 1.730 01/30/2015 1216   Ht Readings from Last 2 Encounters:  10/04/17 _0  (1.6 m)  09/13/17 _1  (1.6 m)   Wt Readings from Last 2 Encounters:  10/04/17 154 lb 11.2 oz (70.2 kg)  09/13/17 160 lb (72.6 kg)   There is no height or weight on file to calculate BMI. BP Readings from Last 3 Encounters:  10/04/17 (!) 131/46  09/13/17 (!) 145/51  09/09/17 140/66     Assessment: Patient has been compliant with Xultophy 24 units daily. Checked BG with her meter. Avg BG readings over past month is 214 with being in range only 34% of the time. Experienced 2 episodes of hypoglycemia in past week after Xultophy dose change. Patient has no complaints with side effects of  medications.   Plan:  D/c Xultophy 24 units daily. Start Ozempic 0.25 units once weekly. Start Tresiba 20 units once daily.  Follow up in 1 week or sooner if any changes in condition or questions regarding medications arise.   The patient verbalized understanding of information provided by repeating back concepts discussed.  [name], PharmD Candidate

## 2017-10-12 NOTE — Progress Notes (Signed)
CC: diarrhea   HPI:  Ms.Alexandrea L Myron is a 77 y.o. with PMH as listed below who presents for acute complaint of abdominal pain associated with weakness. Please see the assessment and plans for the status of the patient chronic medical problems.   Past Medical History:  Diagnosis Date  . Chronic anemia   . Chronic diastolic CHF (congestive heart failure) ([place]) 10/03/2013  . Chronic GI bleeding    [name] 11/29/2014  . Family history of anesthesia complication    "niece has a hard time coming out" (09/15/2012)  . Frequent nosebleeds    chronic  . Gastric AV malformation    [name] 11/29/2014  . GERD (gastroesophageal reflux disease)   . 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 History of blood transfusion "several"  . History of epistaxis   . HTN (hypertension), benign 03/02/2012  . Hyperlipidemia   . Iron deficiency anemia    chronic infusions"  . Lichen planus    Both lower extremities  . 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 Overgrown toenails 03/18/2017  . Pneumonia 1990's X 2  . Pulmonary HTN ([place]) 04/01/2017   PASP 33mmHg on echo 09/2016  . Seizures ([place]) 09/2014  . Symptomatic anemia 11/29/2014  . Telangiectasia    Gastric   . 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   Review of Systems:  Refer to history of present illness and assessment and plans for pertinent review of systems, all others reviewed and negative  Physical Exam:  Vitals:   10/12/17 1339  BP: (!) 155/54  Pulse: 69  Temp: 98.4 F (36.9 C)  TempSrc: Oral  SpO2: 100%  Weight: 156 lb 6.4 oz (70.9 kg)  Height: 5\' 3"  (1.6 m)   General: no acute distress  HEENT: telangectasia evident on the tongue, pale conjunctiva  Cardiac: regular rate and rhythm, 3/6 systolic murmur loudest over RUSB, telengectasias evident over the tongue and both palms, pale conjunctiva  Pulm: normal work of breathing, lungs clear to auscultation  GI: abdomen is soft, non tender,  non distended   Assessment & Plan:   Diarrhea Ms. Lada reports some vague abdominal discomfort which is difficult to describe in character but it localizes to the right upper side. She has been having 2-3 loose bowel movement per day for the past two weeks and notes that her legs have felt weak. She had black stools last week, she has not had bright red blood per rectum. She also had a nose bleed last week. This morning was her last bowel movement. She denies lightheadedness with standing but explains that she has trained herself to slowly rising from sitting to standing to compensate and avoid feeling lightheaded. She denies syncope, difficulty breathing or chest pain. This morning she went to have her blood drawn in preparation for a blood transfusion scheduled for tomorrow and was found to have a hemoglobin 7.3. Her baseline hemoglobin is 8-9, hemoglobin was last checked one week ago and was 8.6 at the time. On review of prior hemoglobin checks she has been trending down by about 1 unit per week since 8/19. Last blood transfusion was 7/22 and her hemoglobin was 11 after that transfusion. She denies other observations of source of bleeding. She has no history of cardiovascular disease. She has good support at home through her husband he helps her with meal preparation and medication management.   Assessment: Presenting symptoms are concerning for slow GI  bleeding from known AVMs of HHS. She also has acute on chronic anemia with baseline hemoglobin 8-9 and asymptomatic orthostatic hypotension. She has been scheduled for a blood transfusion tomorrow morning and feels that the support that she has from her husband will be sufficient to get her to that appointment safely.  - continue with plan for blood transfusion at short stay tomorrow at 8 am - the plan was to  provide 500 cc of IV fluid for orthostatic hypotension in the setting of diarrhea but Ms. Garrott was adamant that she did not want to stay for  this today and would drink plenty of water overnight  - discussed strict return precautions for symptoms of presyncope, chest pain, shortness of breath or sudden onset bright red blood per rectum or brisk melena  - follow up in [facility] for CBC and symptom evaluation in one week   See Encounters Tab for problem based charting.  Patient discussed with Dr. Dareen Piano

## 2017-10-12 NOTE — Telephone Encounter (Signed)
Thank you :)

## 2017-10-12 NOTE — Addendum Note (Signed)
Addended by: Ebbie Latus on: 10/12/2017 03:13 PM   Modules accepted: Orders

## 2017-10-12 NOTE — Telephone Encounter (Signed)
Hbg today 7.3. Also c/o abd pain and diarrhea per triage note; she will be seen today by Dr Hetty Ely.

## 2017-10-12 NOTE — Assessment & Plan Note (Signed)
[name] reports some vague abdominal discomfort which is difficult to describe in character but it localizes to the right upper side. She has been having 2-3 loose bowel movement per day for the past two weeks and notes that her legs have felt weak. She had black stools last week, she has not had bright red blood per rectum. She also had a nose bleed last week. This morning was her last bowel movement. She denies lightheadedness with standing but explains that she has trained herself to slowly rising from sitting to standing to compensate and avoid feeling lightheaded. She denies syncope, difficulty breathing or chest pain. This morning she went to have her blood drawn in preparation for a blood transfusion scheduled for tomorrow and was found to have a hemoglobin 7.3. Her baseline hemoglobin is 8-9, hemoglobin was last checked one week ago and was 8.6 at the time. On review of prior hemoglobin checks she has been trending down by about 1 unit per week since 8/19. Last blood transfusion was 7/22 and her hemoglobin was 11 after that transfusion. She denies other observations of source of bleeding. She has no history of cardiovascular disease. She has good support at home through her husband he helps her with meal preparation and medication management.   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 She also has acute on chronic anemia with baseline hemoglobin 8-9 and asymptomatic orthostatic hypotension. She has been scheduled for a blood transfusion tomorrow morning and feels that the support that she has from her husband will be sufficient to get her to that appointment safely.  - continue with plan for blood transfusion at short stay tomorrow at 8 am - the plan was to  provide 500 cc of IV fluid for orthostatic hypotension in the setting of diarrhea but Ms. Saric was adamant that she did not want to stay for this today and would drink plenty of water overnight   - discussed strict return precautions for symptoms of presyncope, chest pain, shortness of breath or sudden onset bright red blood per rectum or brisk melena  - follow up in [facility] for CBC and symptom evaluation in one week

## 2017-10-13 ENCOUNTER — Ambulatory Visit (HOSPITAL_COMMUNITY)
Admission: RE | Admit: 2017-10-13 | Discharge: 2017-10-13 | Disposition: A | Discharge status: Home / Self Care | Payer: Medicare Other | Source: Ambulatory Visit | Attending: Rheumatology | Admitting: Rheumatology

## 2017-10-13 DIAGNOSIS — K31819 Angiodysplasia of stomach and duodenum without bleeding: Secondary | ICD-10-CM | POA: Insufficient documentation

## 2017-10-13 DIAGNOSIS — D500 Iron deficiency anemia secondary to blood loss (chronic): Secondary | ICD-10-CM | POA: Insufficient documentation

## 2017-10-13 DIAGNOSIS — I780 Hereditary hemorrhagic telangiectasia: Secondary | ICD-10-CM | POA: Insufficient documentation

## 2017-10-13 LAB — PREPARE RBC (CROSSMATCH)

## 2017-10-13 MED ORDER — SODIUM CHLORIDE 0.9% IV SOLUTION: Freq: Once | INTRAVENOUS | Status: DC

## 2017-10-13 MED ORDER — HEPARIN SOD (PORK) LOCK FLUSH 100 UNIT/ML IV SOLN
INTRAVENOUS | Status: AC
  Administered 2017-10-13: 500 [IU]
  Filled 2017-10-13: qty 5

## 2017-10-13 NOTE — Progress Notes (Signed)
Internal Medicine Clinic Attending  Case discussed with Dr. Blum at the time of the visit.  We reviewed the resident's history and exam and pertinent patient test results.  I agree with the assessment, diagnosis, and plan of care documented in the resident's note. 

## 2017-10-13 NOTE — Progress Notes (Signed)
Patient was seen in clinic with [name], PharmD candidate. I agree with the assessment and plan of care documented.

## 2017-10-14 LAB — TYPE AND SCREEN
ABO/RH(D): A POS
Antibody Screen: NEGATIVE
Unit division: 0
Unit division: 0

## 2017-10-14 LAB — BPAM RBC
BLOOD PRODUCT EXPIRATION DATE: 201909072359
BLOOD PRODUCT EXPIRATION DATE: 201909072359
ISSUE DATE / TIME: 201909040822
ISSUE DATE / TIME: 201909041050
UNIT TYPE AND RH: 6200
Unit Type and Rh: 6200

## 2017-10-18 ENCOUNTER — Other Ambulatory Visit: Payer: Self-pay

## 2017-10-18 ENCOUNTER — Encounter: Payer: Self-pay | Admitting: Dietician

## 2017-10-18 ENCOUNTER — Encounter: Payer: Self-pay | Admitting: Internal Medicine

## 2017-10-18 ENCOUNTER — Other Ambulatory Visit: Payer: Medicare Other

## 2017-10-18 ENCOUNTER — Ambulatory Visit (INDEPENDENT_AMBULATORY_CARE_PROVIDER_SITE_OTHER): Payer: Medicare Other | Admitting: Internal Medicine

## 2017-10-18 ENCOUNTER — Telehealth: Payer: Self-pay | Admitting: *Deleted

## 2017-10-18 ENCOUNTER — Other Ambulatory Visit: Payer: Self-pay | Admitting: Oncology

## 2017-10-18 ENCOUNTER — Ambulatory Visit: Payer: Medicare Other | Admitting: Dietician

## 2017-10-18 VITALS — BP 130/40 | HR 62 | Temp 98.3°F | Ht 63.0 in | Wt 159.3 lb

## 2017-10-18 DIAGNOSIS — R040 Epistaxis: Secondary | ICD-10-CM

## 2017-10-18 DIAGNOSIS — I780 Hereditary hemorrhagic telangiectasia: Secondary | ICD-10-CM

## 2017-10-18 DIAGNOSIS — K31819 Angiodysplasia of stomach and duodenum without bleeding: Secondary | ICD-10-CM

## 2017-10-18 DIAGNOSIS — E1129 Type 2 diabetes mellitus with other diabetic kidney complication: Secondary | ICD-10-CM

## 2017-10-18 DIAGNOSIS — D500 Iron deficiency anemia secondary to blood loss (chronic): Secondary | ICD-10-CM

## 2017-10-18 DIAGNOSIS — L439 Lichen planus, unspecified: Secondary | ICD-10-CM | POA: Diagnosis not present

## 2017-10-18 DIAGNOSIS — Z9889 Other specified postprocedural states: Secondary | ICD-10-CM

## 2017-10-18 DIAGNOSIS — IMO0002 Reserved for concepts with insufficient information to code with codable children: Secondary | ICD-10-CM

## 2017-10-18 DIAGNOSIS — I739 Peripheral vascular disease, unspecified: Secondary | ICD-10-CM

## 2017-10-18 DIAGNOSIS — E1165 Type 2 diabetes mellitus with hyperglycemia: Principal | ICD-10-CM

## 2017-10-18 DIAGNOSIS — R011 Cardiac murmur, unspecified: Secondary | ICD-10-CM

## 2017-10-18 DIAGNOSIS — K5900 Constipation, unspecified: Secondary | ICD-10-CM

## 2017-10-18 LAB — CBC WITH DIFFERENTIAL/PLATELET
Abs Immature Granulocytes: 0 10*3/uL (ref 0.0–0.1)
Basophils Absolute: 0 10*3/uL (ref 0.0–0.1)
Basophils Relative: 1 %
EOS ABS: 0.2 10*3/uL (ref 0.0–0.7)
EOS PCT: 4 %
HCT: 27 % — ABNORMAL LOW (ref 36.0–46.0)
Hemoglobin: 8.1 g/dL — ABNORMAL LOW (ref 12.0–15.0)
Immature Granulocytes: 1 %
LYMPHS PCT: 8 %
Lymphs Abs: 0.4 10*3/uL — ABNORMAL LOW (ref 0.7–4.0)
MCH: 30.5 pg (ref 26.0–34.0)
MCHC: 30 g/dL (ref 30.0–36.0)
MCV: 101.5 fL — AB (ref 78.0–100.0)
Monocytes Absolute: 0.4 10*3/uL (ref 0.1–1.0)
Monocytes Relative: 7 %
Neutro Abs: 4.3 10*3/uL (ref 1.7–7.7)
Neutrophils Relative %: 79 %
Platelets: 158 10*3/uL (ref 150–400)
RBC: 2.66 MIL/uL — ABNORMAL LOW (ref 3.87–5.11)
RDW: 16.5 % — ABNORMAL HIGH (ref 11.5–15.5)
WBC: 5.3 10*3/uL (ref 4.0–10.5)

## 2017-10-18 LAB — SAMPLE TO BLOOD BANK

## 2017-10-18 LAB — FERRITIN: Ferritin: 15 ng/mL (ref 11–307)

## 2017-10-18 NOTE — Assessment & Plan Note (Signed)
Patient does not apply any medication. Complaining of worsening and itching of the lesions. Will refer her to dermatology

## 2017-10-18 NOTE — Progress Notes (Signed)
  Medical Nutrition Therapy:  Appt start time: 1115 end time:  1128 Visit # 10  Assessment:  Primary concerns today: glycemic control Met briefly with Ms. Sponsel today to assist with blood sugar control.  " I want to know if I am eating right" to control her blood sugar. She reports that she is eating well, three times a day and takes insulin and diabetes medications as ordered.  She says she sometimes eats a sandwich for a meal when not too hungry or a Boost or Ensure/glucerna. Her weight is fine for her height and age.   MEDICATIONS: Her  husband assists with her medicine. She does not know the names but says she takes 20 units daily and 20 units weekly.  BLOOD SUGAR: METER DOWNLOAD Report summary is from last 30 days,  Average tests per day: 3 Average blood glucose: 217 % hypoglycemia: 2/72 Notes about patterns: lows occurred prior to current medicine regimen and always in the morning. In the past week scine new meidcine regimen lowest was 109 and highest 326  Estimated body mass index is 28.22 kg/m as calculated from the following:   Height as of an earlier encounter on 10/18/17: 5' 3" (1.6 m).   Weight as of an earlier encounter on 10/18/17: 159 lb 4.8 oz (72.3 kg).   Wt Readings from Last 5 Encounters:  10/18/17 159 lb 4.8 oz (72.3 kg)  10/13/17 154 lb (69.9 kg)  10/12/17 156 lb 6.4 oz (70.9 kg)  10/04/17 154 lb 11.2 oz (70.2 kg)  09/13/17 160 lb (72.6 kg)    Progress Towards Goal(s):  In progress.   Nutritional Diagnosis:  Arthur-2.2 Altered nutrition-related laboratory As related to high pranidal blood sugar continues as a problem  As evidenced by her meter data    Intervention:  Nutrition education about continuing to be as consistent as possible with meals and insulin as she is doing.  Encouraged her that she is doing well and eating appropriately.   Coordination of care: agree with new medicine regimen- encouraged patient to continue Teaching Method Utilized: Visual,  Auditory, Hands on Handouts given during visit include:AVS Barriers to learning/adherence to lifestyle change: support, memory issues Demonstrated degree of understanding via:  Teach Back   Monitoring/Evaluation:  Dietary intake, exercise, meter, and body weight in 6-8 weeks  in office.  Debera Lat, RD 10/18/2017 2:20 PM.

## 2017-10-18 NOTE — Telephone Encounter (Signed)
Call made to patient- IV iron infusion and blood transfusion scheduled for Wed 9/11 @ 8am here at [facility] .Despina Hidden Cassady9/9/20193:27 PM

## 2017-10-18 NOTE — Patient Instructions (Addendum)
It was our pleasure taking care of you in our clinic today. You were seen for follow up of weakness, abdominal pain and blood loss from your stomach (due to chronic vascular abnormality). Today your vital signs are stable and your exam is reassuring. We check your blood for hemoglobin today. You also complained about itching on the lesions (Lichen planus) on your legs. We refer you to a dermatologist to evaluate you for that.  Please let us know if you have any question and follow up with your primary care. Thanks  Dr. Linna Hoff

## 2017-10-18 NOTE — Progress Notes (Signed)
CC: Follow up  HPI:  [name] is a 77 y.o. with PMHx listed below. Is here for follow up post transfusion due to acute on chronic anemia and abdominal discomfort likely 2/2 known gastric AVM, HHT bleeding. Please see assessment and plan for more details.  Past Medical History:  Diagnosis Date  . Chronic anemia   . Chronic diastolic CHF (congestive heart failure) ([place]) 10/03/2013  . Chronic GI bleeding    [name] 11/29/2014  . Family history of anesthesia complication    "niece has a hard time coming out" (09/15/2012)  . Frequent nosebleeds    chronic  . Gastric AV malformation    [name] 11/29/2014  . GERD (gastroesophageal reflux disease)   . 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 History of blood transfusion "several"  . History of epistaxis   . HTN (hypertension), benign 03/02/2012  . Hyperlipidemia   . Iron deficiency anemia    chronic infusions"  . Lichen planus    Both lower extremities  . 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 Overgrown toenails 03/18/2017  . Pneumonia 1990's X 2  . Pulmonary HTN ([place]) 04/01/2017   PASP 37mmHg on echo 09/2016  . Seizures ([place]) 09/2014  . Symptomatic anemia 11/29/2014  . Telangiectasia    Gastric   . 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   Review of Systems:  Review of Systems  Constitutional: Negative for chills, fever and malaise/fatigue.  HENT: Positive for nosebleeds.   Respiratory: Negative for cough and hemoptysis.   Cardiovascular: Positive for claudication and leg swelling. Negative for chest pain and palpitations.  Gastrointestinal: Positive for constipation. Negative for abdominal pain, diarrhea, nausea and vomiting.  Skin: Positive for itching and rash.  Neurological: Negative for dizziness and headaches.     Physical Exam: Vitals:   10/18/17 0952  BP: (!) 130/40  Pulse: 62  Temp: 98.3 F (36.8 C)  TempSrc: Oral  SpO2: 100%  Weight: 159 lb 4.8 oz (72.3  kg)  Height: 5\' 3"  (1.6 m)   Physical Exam  Constitutional: She is oriented to person, place, and time. She appears well-developed and well-nourished. No distress.  HENT:  Head: Normocephalic and atraumatic.  Cardiovascular: Normal rate and regular rhythm.  Systolic Murmur heard with radiation to neck. Pulmonary/Chest: Effort normal and breath sounds normal. No respiratory distress. She has no wheezes. She has no rales.  Abdominal: Soft. She exhibits no distension. There is no tenderness. There is no guarding.  Musculoskeletal: Normal range of motion. She exhibits edema and tenderness. She exhibits no deformity.  Neurological: She is alert and oriented to person, place, and time. No sensory deficit. She exhibits normal muscle tone.  Skin: Skin is warm and dry. Capillary refill takes 2 to 3 seconds. Plaque on legs noted.  Psychiatric: She has a normal mood and affect. Her behavior is normal.   Family Hx: Stroke in father  Social Hx:  No smoking No alcohol use No drug use      Assessment & Plan:   See Encounters Tab for problem based charting.  Gastric AVM, HHT S/p blood transfusion a week ago. Currently is asymptomatic and clinically stable. Specifically has a good appetite with no nausea, vomiting, and no abdominal pain. Reports some constipation that looks like her baseline to her. No rectal bleeding is reported. No melena in last week. No lightheadedness or syncope.  -CBC today -Follow up with PCP  Lichen planus Is not applying Triamcinolone. Is complaining of some worsening and itching on the lesion  -Dermatology refferal  Patient seen with Dr. Rebeca Alert

## 2017-10-19 ENCOUNTER — Ambulatory Visit: Payer: Medicare Other

## 2017-10-19 ENCOUNTER — Ambulatory Visit: Payer: Medicare Other | Admitting: Pharmacist

## 2017-10-19 LAB — PREPARE RBC (CROSSMATCH)

## 2017-10-20 ENCOUNTER — Other Ambulatory Visit: Payer: Self-pay | Admitting: Internal Medicine

## 2017-10-20 ENCOUNTER — Ambulatory Visit (HOSPITAL_COMMUNITY)
Admission: RE | Admit: 2017-10-20 | Discharge: 2017-10-20 | Disposition: A | Discharge status: Home / Self Care | Payer: Medicare Other | Source: Ambulatory Visit | Attending: Oncology | Admitting: Oncology

## 2017-10-20 DIAGNOSIS — D500 Iron deficiency anemia secondary to blood loss (chronic): Secondary | ICD-10-CM

## 2017-10-20 DIAGNOSIS — E7220 Disorder of urea cycle metabolism, unspecified: Secondary | ICD-10-CM

## 2017-10-20 DIAGNOSIS — K31819 Angiodysplasia of stomach and duodenum without bleeding: Secondary | ICD-10-CM | POA: Diagnosis present

## 2017-10-20 DIAGNOSIS — I780 Hereditary hemorrhagic telangiectasia: Secondary | ICD-10-CM

## 2017-10-20 LAB — BPAM RBC
Blood Product Expiration Date: 201910032359
Blood Product Expiration Date: 201910052359
UNIT TYPE AND RH: 6200
UNIT TYPE AND RH: 6200

## 2017-10-20 LAB — TYPE AND SCREEN
ABO/RH(D): A POS
Antibody Screen: NEGATIVE
UNIT DIVISION: 0
Unit division: 0

## 2017-10-20 LAB — PREPARE RBC (CROSSMATCH)

## 2017-10-20 MED ORDER — SODIUM CHLORIDE 0.9% IV SOLUTION: Freq: Once | INTRAVENOUS | Status: DC

## 2017-10-20 MED ORDER — HEPARIN SOD (PORK) LOCK FLUSH 100 UNIT/ML IV SOLN
INTRAVENOUS | Status: AC
  Administered 2017-10-20: 500 [IU]
  Filled 2017-10-20: qty 5

## 2017-10-20 MED ORDER — SODIUM CHLORIDE 0.9 % IV SOLN
510.0000 mg | INTRAVENOUS | Status: DC
  Administered 2017-10-20: 510 mg via INTRAVENOUS
  Filled 2017-10-20: qty 17

## 2017-10-21 LAB — BPAM RBC
BLOOD PRODUCT EXPIRATION DATE: 201909142359
Blood Product Expiration Date: 201909182359
ISSUE DATE / TIME: 201909110934
ISSUE DATE / TIME: 201909111209
Unit Type and Rh: 600
Unit Type and Rh: 6200

## 2017-10-21 LAB — TYPE AND SCREEN
ABO/RH(D): A POS
Antibody Screen: NEGATIVE
UNIT DIVISION: 0
UNIT DIVISION: 0

## 2017-10-25 ENCOUNTER — Other Ambulatory Visit (INDEPENDENT_AMBULATORY_CARE_PROVIDER_SITE_OTHER): Payer: Medicare Other

## 2017-10-25 ENCOUNTER — Ambulatory Visit: Payer: Medicare Other | Admitting: Pharmacist

## 2017-10-25 ENCOUNTER — Telehealth: Payer: Self-pay | Admitting: *Deleted

## 2017-10-25 DIAGNOSIS — K31819 Angiodysplasia of stomach and duodenum without bleeding: Secondary | ICD-10-CM | POA: Diagnosis not present

## 2017-10-25 DIAGNOSIS — D500 Iron deficiency anemia secondary to blood loss (chronic): Secondary | ICD-10-CM

## 2017-10-25 DIAGNOSIS — I780 Hereditary hemorrhagic telangiectasia: Secondary | ICD-10-CM

## 2017-10-25 LAB — CBC WITH DIFFERENTIAL/PLATELET
ABS IMMATURE GRANULOCYTES: 0 10*3/uL (ref 0.0–0.1)
BASOS PCT: 1 %
Basophils Absolute: 0 10*3/uL (ref 0.0–0.1)
Eosinophils Absolute: 0.2 10*3/uL (ref 0.0–0.7)
Eosinophils Relative: 5 %
HEMATOCRIT: 32.9 % — AB (ref 36.0–46.0)
Hemoglobin: 10 g/dL — ABNORMAL LOW (ref 12.0–15.0)
Immature Granulocytes: 0 %
LYMPHS ABS: 0.4 10*3/uL — AB (ref 0.7–4.0)
Lymphocytes Relative: 9 %
MCH: 29.2 pg (ref 26.0–34.0)
MCHC: 30.4 g/dL (ref 30.0–36.0)
MCV: 96.2 fL (ref 78.0–100.0)
MONO ABS: 0.4 10*3/uL (ref 0.1–1.0)
MONOS PCT: 9 %
Neutro Abs: 3.8 10*3/uL (ref 1.7–7.7)
Neutrophils Relative %: 76 %
PLATELETS: 157 10*3/uL (ref 150–400)
RBC: 3.42 MIL/uL — ABNORMAL LOW (ref 3.87–5.11)
RDW: 20 % — AB (ref 11.5–15.5)
WBC: 4.9 10*3/uL (ref 4.0–10.5)

## 2017-10-25 LAB — SAMPLE TO BLOOD BANK

## 2017-10-25 NOTE — Telephone Encounter (Signed)
Called pt - talked to her husband - informed "lab good today (hgb 10.0)- no blood this week" per Dr [name]. Stated good and he will let pt know.

## 2017-10-25 NOTE — Telephone Encounter (Signed)
-----   Message from [name], MD sent at 10/25/2017 11:49 AM EDT ----- Call pt: lab good today - no blood this week

## 2017-10-25 NOTE — Patient Instructions (Signed)
INCREASE OZEMPIC (SEMAGLUTIDE) TO 0.5 MG ONCE WEEKLY CONTINUE TRESIBA (INSULIN DEGLUDEC) 20 UNITS DAILY PLEASE BRING ALL MEDICATIONS AND BLOOD GLUCOSE METER TO APPOINTMENT NEXT WEEK

## 2017-10-25 NOTE — Progress Notes (Signed)
Internal Medicine Clinic Attending  I saw and evaluated the patient.  I personally confirmed the key portions of the history and exam documented by Dr. Myrtie Hawk and I reviewed pertinent patient test results.  The assessment, diagnosis, and plan were formulated together and I agree with the documentation in the resident's note.  Ongoing iron deficiency anemia, likely requires IV iron at this point.   Lenice Pressman, M.D., Ph.D.

## 2017-10-25 NOTE — Progress Notes (Signed)
S: [name] is a 77 y.o. female reports to clinical pharmacist appointment for diabetes management. Patient did not bring medication bottles. Patient is accompanied by husband, who assist at home with medication management.  Allergies  Allergen Reactions  . Aspirin Nausea And Vomiting   Prior to Admission medications   Medication Sig Start Date End Date Taking? Authorizing Provider  b complex vitamins capsule Take 1 capsule by mouth daily. 10/26/16   Alphonzo Grieve, MD  Blood Glucose Monitoring Suppl (ONETOUCH VERIO) w/Device KIT  07/07/16   [provider]  calcium citrate-vitamin D (CITRACAL+D) 315-200 MG-UNIT tablet Take 1 tablet by mouth 2 (two) times daily. 12/31/15 04/01/17  Ledell Noss, MD  cetaphil (CETAPHIL) lotion Apply 1 application topically as needed for dry skin. 02/18/16   Rice, Resa Miner, MD  donepezil (ARICEPT) 10 MG tablet Take 1 tablet (10 mg total) by mouth at bedtime. 11/10/16   Melvenia Beam, MD  Elastic Bandages & Supports ([place]) MISC Wear as much as possible while awake to reduce swelling 04/27/16   Minus Liberty, MD  FERROCITE 324 MG TABS tablet TAKE 1 TABLET BY MOUTH TWICE DAILY 09/27/17   Bartholomew Crews, MD  furosemide (LASIX) 20 MG tablet TAKE 1 TABLET BY MOUTH ONCE DAILY 05/19/17   Lyda Jester M, PA-C  glucose blood (ONETOUCH VERIO) test strip Use as instructed 3-4 times daily. E11.29, insulin requiring 03/18/17   [name], DO  glucose blood [facility] VERIO) test strip Check blood sugar 4 times a day 09/20/17   Bartholomew Crews, MD  Insulin Degludec-Liraglutide 100-3.6 UNIT-MG/ML SOPN Inject 20 Units into the skin every morning. Notify physician if you start experiencing signs of hypoglycemia 09/20/17   Bartholomew Crews, MD  Insulin Pen Needle (B-D UF III MINI PEN NEEDLES) 31G X 5 MM MISC USE TO INJECT INSULIN 3 TIMES DAILY 07/06/17   [name], DO  lactulose (CHRONULAC) 10 GM/15ML solution TAKE  15 ML BY MOUTH TWO TIMES DAILY 10/25/17   Aldine Contes, MD  levETIRAcetam (KEPPRA) 500 MG tablet Take 1 tablet (500 mg total) by mouth 2 (two) times daily. 11/10/16   Melvenia Beam, MD  memantine (NAMENDA) 10 MG tablet Take 1 tablet (10 mg total) by mouth 2 (two) times daily. 11/10/16   Melvenia Beam, MD  [facility] DELICA LANCETS FINE MISC Check blood sugar up to 4 times a day 09/20/17   Bartholomew Crews, MD  oxymetazoline (AFRIN) 0.05 % nasal spray Place 1 spray into both nostrils 2 (two) times daily as needed (Epistaxis). 10/05/13   Robbie Lis, MD  pantoprazole (PROTONIX) 40 MG tablet Take 1 tablet (40 mg total) by mouth 2 (two) times daily. 07/07/14   Hosie Poisson, MD  potassium chloride (K-DUR) 10 MEQ tablet Take 1 tablet (10 mEq total) by mouth daily. 04/12/17   [name], MD  potassium chloride SA (KLOR-CON M20) 20 MEQ tablet TAKE 1 TABLET BY MOUTH ONCE DAILY 09/17/16   Lyda Jester M, PA-C  pramipexole (MIRAPEX) 0.125 MG tablet TAKE 1 TABLET BY MOUTH ONCE DAILY 08/20/17   Aldine Contes, MD  sodium chloride (OCEAN) 0.65 % SOLN nasal spray Place 1 spray into both nostrils as needed for congestion. 07/30/16   [name], DO  triamcinolone ointment (KENALOG) 0.5 % APPLY 1 APPLICATION TOPICALLY TO RASH TWICE DAILY FOR ITCHING 08/17/17   Axel Filler, MD   Past Medical History:  Diagnosis Date  . Chronic anemia   .  Chronic diastolic CHF (congestive heart failure) ([place]) 10/03/2013  . Chronic GI bleeding    [name] 11/29/2014  . Family history of anesthesia complication    "niece has a hard time coming out" (09/15/2012)  . Frequent nosebleeds    chronic  . Gastric AV malformation    [name] 11/29/2014  . GERD (gastroesophageal reflux disease)   . 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 History of blood transfusion "several"  . History of epistaxis   . HTN (hypertension), benign 03/02/2012  . Hyperlipidemia   . Iron deficiency anemia     chronic infusions"  . Lichen planus    Both lower extremities  . 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 Overgrown toenails 03/18/2017  . Pneumonia 1990's X 2  . Pulmonary HTN ([place]) 04/01/2017   PASP 9mHg on echo 09/2016  . [place]  . Symptomatic anemia 11/29/2014  . Telangiectasia    Gastric   . 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   Social History   Socioeconomic History  . Marital status: Married    Spouse name: JJenny Reichmann . Number of children: 0  . Years of education: 9  . Highest education level: Not on file  Occupational History  . Occupation: Retired tSystems developer RETIRED  Social Needs  . Financial resource strain: Not on file  . Food insecurity:    Worry: Not on file    Inability: Not on file  . Transportation needs:    Medical: Not on file    Non-medical: Not on file  Tobacco Use  . Smoking status: Former Smoker    Packs/day: 1.00    Years: 20.00    Pack years: 20.00    Types: Cigarettes    Last attempt to quit: 02/10/1971    Years since quitting: 46.7  . Smokeless tobacco: Never Used  . Tobacco comment: 09/15/2012 "smoked 50-60 yr ago"  Substance and Sexual Activity  . Alcohol use: No    Alcohol/week: 0.0 standard drinks  . Drug use: No  . Sexual activity: Not on file  Lifestyle  . Physical activity:    Days per week: Not on file    Minutes per session: Not on file  . Stress: Not on file  Relationships  . Social connections:    Talks on phone: Not on file    Gets together: Not on file    Attends religious service: Not on file    Active member of club or organization: Not on file    Attends meetings of clubs or organizations: Not on file    Relationship status: Not on file  Other Topics Concern  . Not on file  Social History Narrative   Married, lives with husband, JJenny Reichmann  Ambulates without assistance.     Caffeine use: none   Family History  Problem Relation Age of Onset  . Stroke Father    . Breast cancer Unknown   . Malignant hyperthermia Neg Hx   . Seizures Neg Hx     O:    Component Value Date/Time   CHOL 141 11/29/2014 1026   HDL 31 (L) 11/29/2014 1026   TRIG 141 11/29/2014 1026   AST 23 10/04/2017 1120   AST 30 04/04/2014 1019   ALT 17 10/04/2017 1120   ALT 25 04/04/2014 1019   NA 141 10/04/2017 1120   NA 144 04/04/2014 1019   K 4.8 10/04/2017  1120   K 4.5 04/04/2014 1019   CL 105 10/04/2017 1120   CL 111 (H) 12/28/2011 0825   CO2 22 10/04/2017 1120   CO2 26 04/04/2014 1019   GLUCOSE 145 (H) 10/04/2017 1120   GLUCOSE 180 (H) 10/26/2016 1115   GLUCOSE 156 (H) 04/04/2014 1019   GLUCOSE 152 (H) 12/28/2011 0825   HGBA1C 5.9 (A) 07/15/2017 1127   HGBA1C 7.3 (H) 09/05/2014 0720   BUN 18 10/04/2017 1120   BUN 16.9 04/04/2014 1019   CREATININE 1.09 (H) 10/04/2017 1120   CREATININE 0.92 01/28/2015 0811   CREATININE 0.9 04/04/2014 1019   CALCIUM 10.8 (H) 10/04/2017 1120   CALCIUM 9.3 04/04/2014 1019   GFRNONAA 49 (L) 10/04/2017 1120   GFRAA 57 (L) 10/04/2017 1120   WBC 4.9 10/25/2017 1115   HGB 10.0 (L) 10/25/2017 1115   HGB 9.7 (L) 03/13/2015 1059   HCT 32.9 (L) 10/25/2017 1115   HCT 29.5 (L) 03/13/2015 1059   PLT 157 10/25/2017 1115   PLT 140 (L) 03/13/2015 1059   PLT 237 11/09/2014 1547   TSH 1.730 01/30/2015 1216   Ht Readings from Last 2 Encounters:  10/20/17 '5\' 3"'$  (1.6 m)  10/18/17 '5\' 3"'$  (1.6 m)   Wt Readings from Last 2 Encounters:  10/20/17 155 lb (70.3 kg)  10/18/17 159 lb 4.8 oz (72.3 kg)   There is no height or weight on file to calculate BMI. BP Readings from Last 3 Encounters:  10/20/17 (!) 140/44  10/18/17 (!) 130/40  10/13/17 (!) 127/50     Assessment: Patient has been compliant with Tresiba 20 units daily and Ozempic 0.'25mg'$  weekly with no complaints of side effects. Checked her BG meter and there were no reports of hypoglycemia. Highest was 315 and lowest was 100. Most readings in 100's and 200's.  Plan:  Increase to  Ozempic 0.5 units once weekly. Continue Tresiba 20 units once daily.  Follow up in 1 week or sooner if any changes in condition or questions regarding medications arise.  An after visit summary was provided and patient advised to follow up in 1 week or sooner if any changes in condition or questions regarding medications arise.   The patient and husband verbalized understanding of information provided by repeating back concepts discussed.  [name], PharmD Candidate

## 2017-10-26 ENCOUNTER — Ambulatory Visit: Payer: Medicare Other | Admitting: Pharmacist

## 2017-10-26 ENCOUNTER — Other Ambulatory Visit (HOSPITAL_COMMUNITY): Payer: Self-pay | Admitting: *Deleted

## 2017-10-27 ENCOUNTER — Ambulatory Visit (HOSPITAL_COMMUNITY)
Admission: RE | Admit: 2017-10-27 | Discharge: 2017-10-27 | Disposition: A | Discharge status: Home / Self Care | Payer: Medicare Other | Source: Ambulatory Visit | Attending: Oncology | Admitting: Oncology

## 2017-10-27 DIAGNOSIS — I780 Hereditary hemorrhagic telangiectasia: Secondary | ICD-10-CM | POA: Diagnosis present

## 2017-10-27 DIAGNOSIS — D500 Iron deficiency anemia secondary to blood loss (chronic): Secondary | ICD-10-CM

## 2017-10-27 DIAGNOSIS — K31819 Angiodysplasia of stomach and duodenum without bleeding: Secondary | ICD-10-CM | POA: Diagnosis present

## 2017-10-27 MED ORDER — HEPARIN SOD (PORK) LOCK FLUSH 100 UNIT/ML IV SOLN
INTRAVENOUS | Status: AC
  Administered 2017-10-27: 500 [IU] via INTRAVENOUS
  Filled 2017-10-27: qty 5

## 2017-10-27 MED ORDER — SODIUM CHLORIDE 0.9 % IV SOLN
510.0000 mg | INTRAVENOUS | Status: AC
  Administered 2017-10-27: 510 mg via INTRAVENOUS
  Filled 2017-10-27: qty 17

## 2017-11-01 ENCOUNTER — Other Ambulatory Visit (INDEPENDENT_AMBULATORY_CARE_PROVIDER_SITE_OTHER): Payer: Medicare Other

## 2017-11-01 ENCOUNTER — Ambulatory Visit (INDEPENDENT_AMBULATORY_CARE_PROVIDER_SITE_OTHER): Payer: Medicare Other | Admitting: Pharmacist

## 2017-11-01 ENCOUNTER — Telehealth: Payer: Self-pay | Admitting: *Deleted

## 2017-11-01 DIAGNOSIS — D500 Iron deficiency anemia secondary to blood loss (chronic): Secondary | ICD-10-CM | POA: Diagnosis not present

## 2017-11-01 DIAGNOSIS — Z794 Long term (current) use of insulin: Secondary | ICD-10-CM

## 2017-11-01 DIAGNOSIS — K31819 Angiodysplasia of stomach and duodenum without bleeding: Secondary | ICD-10-CM | POA: Diagnosis not present

## 2017-11-01 DIAGNOSIS — I780 Hereditary hemorrhagic telangiectasia: Secondary | ICD-10-CM | POA: Diagnosis not present

## 2017-11-01 DIAGNOSIS — E1165 Type 2 diabetes mellitus with hyperglycemia: Secondary | ICD-10-CM

## 2017-11-01 DIAGNOSIS — E118 Type 2 diabetes mellitus with unspecified complications: Secondary | ICD-10-CM

## 2017-11-01 LAB — CBC WITH DIFFERENTIAL/PLATELET
Abs Immature Granulocytes: 0 10*3/uL (ref 0.0–0.1)
BASOS PCT: 0 %
Basophils Absolute: 0 10*3/uL (ref 0.0–0.1)
Eosinophils Absolute: 0.3 10*3/uL (ref 0.0–0.7)
Eosinophils Relative: 6 %
HEMATOCRIT: 28.8 % — AB (ref 36.0–46.0)
Hemoglobin: 8.9 g/dL — ABNORMAL LOW (ref 12.0–15.0)
IMMATURE GRANULOCYTES: 1 %
LYMPHS ABS: 0.4 10*3/uL — AB (ref 0.7–4.0)
Lymphocytes Relative: 9 %
MCH: 31.2 pg (ref 26.0–34.0)
MCHC: 30.9 g/dL (ref 30.0–36.0)
MCV: 101.1 fL — ABNORMAL HIGH (ref 78.0–100.0)
MONOS PCT: 8 %
Monocytes Absolute: 0.4 10*3/uL (ref 0.1–1.0)
NEUTROS ABS: 3.6 10*3/uL (ref 1.7–7.7)
NEUTROS PCT: 76 %
Platelets: 177 10*3/uL (ref 150–400)
RBC: 2.85 MIL/uL — ABNORMAL LOW (ref 3.87–5.11)
RDW: 18.6 % — ABNORMAL HIGH (ref 11.5–15.5)
WBC: 4.8 10*3/uL (ref 4.0–10.5)

## 2017-11-01 LAB — SAMPLE TO BLOOD BANK

## 2017-11-01 MED ORDER — INSULIN DEGLUDEC 100 UNIT/ML ~~LOC~~ SOPN: 20.0000 [IU] | PEN_INJECTOR | SUBCUTANEOUS | 3 refills | Status: DC

## 2017-11-01 MED ORDER — SEMAGLUTIDE(0.25 OR 0.5MG/DOS) 2 MG/1.5ML ~~LOC~~ SOPN: 0.5000 mg | PEN_INJECTOR | SUBCUTANEOUS | 3 refills | Status: DC

## 2017-11-01 NOTE — Addendum Note (Signed)
Addended by: Truddie Crumble on: 11/01/2017 11:35 AM   Modules accepted: Orders

## 2017-11-01 NOTE — Patient Instructions (Signed)
Continue taking insulin degludec 20 units daily and Ozempic (semaglutide) 0.5 units weekly, no medication changes today.  Please continue checking blood sugars at home, and bring meter in for your appointment next week.

## 2017-11-01 NOTE — Telephone Encounter (Signed)
Pt called / informed "blood OK no transfusion this week (hgb 8.9, prev 10.0)" per Dr [name].  Stated "good to hear", and thanks for calling.

## 2017-11-01 NOTE — Progress Notes (Signed)
S: [name] is a 77 y.o. female reports to clinical pharmacist appointment for DM med management per PCP  Allergies  Allergen Reactions  . Aspirin Nausea And Vomiting   Medication Sig  b complex vitamins capsule Take 1 capsule by mouth daily.  Blood Glucose Monitoring Suppl (ONETOUCH VERIO) w/Device KIT   calcium citrate-vitamin D (CITRACAL+D) 315-200 MG-UNIT tablet Take 1 tablet by mouth 2 (two) times daily.  cetaphil (CETAPHIL) lotion Apply 1 application topically as needed for dry skin.  donepezil (ARICEPT) 10 MG tablet Take 1 tablet (10 mg total) by mouth at bedtime.  Elastic Bandages & Supports (MEDICAL COMPRESSION STOCKINGS) MISC Wear as much as possible while awake to reduce swelling  FERROCITE 324 MG TABS tablet TAKE 1 TABLET BY MOUTH TWICE DAILY  furosemide (LASIX) 20 MG tablet TAKE 1 TABLET BY MOUTH ONCE DAILY  glucose blood (ONETOUCH VERIO) test strip Use as instructed 3-4 times daily. E11.29, insulin requiring  glucose blood (ONETOUCH VERIO) test strip Check blood sugar 4 times a day  insulin degludec (TRESIBA FLEXTOUCH) 100 UNIT/ML SOPN FlexTouch Pen Inject 0.2 mLs (20 Units total) into the skin every morning.  Insulin Pen Needle (B-D UF III MINI PEN NEEDLES) 31G X 5 MM MISC USE TO INJECT INSULIN 3 TIMES DAILY  lactulose (CHRONULAC) 10 GM/15ML solution TAKE 15 ML BY MOUTH TWO TIMES DAILY  levETIRAcetam (KEPPRA) 500 MG tablet Take 1 tablet (500 mg total) by mouth 2 (two) times daily.  memantine (NAMENDA) 10 MG tablet Take 1 tablet (10 mg total) by mouth 2 (two) times daily.  ONETOUCH DELICA LANCETS FINE MISC Check blood sugar up to 4 times a day  oxymetazoline (AFRIN) 0.05 % nasal spray Place 1 spray into both nostrils 2 (two) times daily as needed (Epistaxis).  pantoprazole (PROTONIX) 40 MG tablet Take 1 tablet (40 mg total) by mouth 2 (two) times daily.  potassium chloride (K-DUR) 10 MEQ tablet Take 1 tablet (10 mEq total) by mouth daily.  potassium chloride SA  (KLOR-CON M20) 20 MEQ tablet TAKE 1 TABLET BY MOUTH ONCE DAILY  pramipexole (MIRAPEX) 0.125 MG tablet TAKE 1 TABLET BY MOUTH ONCE DAILY  Semaglutide,0.25 or 0.5MG/DOS, (OZEMPIC, 0.25 OR 0.5 MG/DOSE,) 2 MG/1.5ML SOPN Inject 0.5 mg into the skin once a week.  sodium chloride (OCEAN) 0.65 % SOLN nasal spray Place 1 spray into both nostrils as needed for congestion.  triamcinolone ointment (KENALOG) 0.5 % APPLY 1 APPLICATION TOPICALLY TO RASH TWICE DAILY FOR ITCHING   Past Medical History:  Diagnosis Date  . Chronic anemia   . Chronic diastolic CHF (congestive heart failure) ([place]) 10/03/2013  . Chronic GI bleeding    [name] 11/29/2014  . Family history of anesthesia complication    "niece has a hard time coming out" (09/15/2012)  . Frequent nosebleeds    chronic  . Gastric AV malformation    [name] 11/29/2014  . GERD (gastroesophageal reflux disease)   . 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 History of blood transfusion "several"  . History of epistaxis   . HTN (hypertension), benign 03/02/2012  . Hyperlipidemia   . Iron deficiency anemia    chronic infusions"  . Lichen planus    Both lower extremities  . 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 Overgrown toenails 03/18/2017  . Pneumonia 1990's X 2  . Pulmonary HTN ([place]) 04/01/2017   PASP 12mHg on echo 09/2016  . Seizures ([place] 09/2014  . Symptomatic anemia 11/29/2014  .  Telangiectasia    Gastric   . 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   Social History   Socioeconomic History  . Marital status: Married    Spouse name: [name]  . Number of children: 0  . Years of education: 9  . Highest education level: Not on file  Occupational History  . Occupation: Retired Systems developer: RETIRED  Social Needs  . Financial resource strain: Not on file  . Food insecurity:    Worry: Not on file    Inability: Not on file  . Transportation needs:    Medical: Not on file     Non-medical: Not on file  Tobacco Use  . Smoking status: Former Smoker    Packs/day: 1.00    Years: 20.00    Pack years: 20.00    Types: Cigarettes    Last attempt to quit: 02/10/1971    Years since quitting: 46.7  . Smokeless tobacco: Never Used  . Tobacco comment: 09/15/2012 "smoked 50-60 yr ago"  Substance and Sexual Activity  . Alcohol use: No    Alcohol/week: 0.0 standard drinks  . Drug use: No  . Sexual activity: Not on file  Lifestyle  . Physical activity:    Days per week: Not on file    Minutes per session: Not on file  . Stress: Not on file  Relationships  . Social connections:    Talks on phone: Not on file    Gets together: Not on file    Attends religious service: Not on file    Active member of club or organization: Not on file    Attends meetings of clubs or organizations: Not on file    Relationship status: Not on file  Other Topics Concern  . Not on file  Social History Narrative   Married, lives with husband, [name].  Ambulates without assistance.     Caffeine use: none   Family History  Problem Relation Age of Onset  . Stroke Father   . Breast cancer Unknown   . Malignant hyperthermia Neg Hx   . Seizures Neg Hx    O: Component Value Date/Time   CHOL 141 11/29/2014 1026   HDL 31 (L) 11/29/2014 1026   TRIG 141 11/29/2014 1026   AST 23 10/04/2017 1120   AST 30 04/04/2014 1019   ALT 17 10/04/2017 1120   ALT 25 04/04/2014 1019   NA 141 10/04/2017 1120   NA 144 04/04/2014 1019   K 4.8 10/04/2017 1120   K 4.5 04/04/2014 1019   CL 105 10/04/2017 1120   CL 111 (H) 12/28/2011 0825   CO2 22 10/04/2017 1120   CO2 26 04/04/2014 1019   GLUCOSE 145 (H) 10/04/2017 1120   GLUCOSE 180 (H) 10/26/2016 1115   GLUCOSE 156 (H) 04/04/2014 1019   GLUCOSE 152 (H) 12/28/2011 0825   HGBA1C 5.9 (A) 07/15/2017 1127   HGBA1C 7.3 (H) 09/05/2014 0720   BUN 18 10/04/2017 1120   BUN 16.9 04/04/2014 1019   CREATININE 1.09 (H) 10/04/2017 1120   CREATININE 0.92 01/28/2015  0811   CREATININE 0.9 04/04/2014 1019   CALCIUM 10.8 (H) 10/04/2017 1120   CALCIUM 9.3 04/04/2014 1019   GFRNONAA 49 (L) 10/04/2017 1120   GFRAA 57 (L) 10/04/2017 1120   WBC 4.8 11/01/2017 1120   HGB 8.9 (L) 11/01/2017 1120   HGB 9.7 (L) 03/13/2015 1059   HCT 28.8 (L) 11/01/2017 1120   HCT 29.5 (L) 03/13/2015 1059  PLT 177 11/01/2017 1120   PLT 140 (L) 03/13/2015 1059   PLT 237 11/09/2014 1547   TSH 1.730 01/30/2015 1216   Ht Readings from Last 2 Encounters:  10/27/17 5' 3" (1.6 m)  10/20/17 5' 3" (1.6 m)   Wt Readings from Last 2 Encounters:  10/27/17 155 lb (70.3 kg)  10/20/17 155 lb (70.3 kg)   There is no height or weight on file to calculate BMI. BP Readings from Last 3 Encounters:  10/27/17 (!) 139/51  10/20/17 (!) 140/44  10/18/17 (!) 130/40   A/P:  Since adjustments made last week (increased Ozempic to 0.5 mg weekly and continued insulin degludec 20 units daily), home BG average 188 ; no episodes of hypoglycemia, no BG results greater than 300.  Patient reports no symptoms of concern today. She is no longer experiencing GI symptoms as mentioned a few weeks ago.  No changes made today, continue to monitor BG at home, follow up in 1 week.  An after visit summary was provided and patient advised to follow up sooner if any changes in condition or questions regarding medications arise.   The patient and family verbalized understanding of information provided by repeating back concepts discussed.   15 minutes spent face-to-face with the patient during the encounter. 50% of time spent on education. 50% of time was spent on assessment and plan.

## 2017-11-04 ENCOUNTER — Inpatient Hospital Stay (HOSPITAL_COMMUNITY): Admission: RE | Admit: 2017-11-04 | Payer: Medicare Other | Source: Ambulatory Visit

## 2017-11-04 ENCOUNTER — Encounter: Payer: Medicare Other | Admitting: Vascular Surgery

## 2017-11-08 ENCOUNTER — Other Ambulatory Visit: Payer: Self-pay | Admitting: Oncology

## 2017-11-08 ENCOUNTER — Telehealth: Payer: Self-pay | Admitting: *Deleted

## 2017-11-08 ENCOUNTER — Other Ambulatory Visit (INDEPENDENT_AMBULATORY_CARE_PROVIDER_SITE_OTHER): Payer: Medicare Other

## 2017-11-08 DIAGNOSIS — D500 Iron deficiency anemia secondary to blood loss (chronic): Secondary | ICD-10-CM | POA: Diagnosis not present

## 2017-11-08 DIAGNOSIS — K31819 Angiodysplasia of stomach and duodenum without bleeding: Secondary | ICD-10-CM | POA: Diagnosis not present

## 2017-11-08 DIAGNOSIS — I780 Hereditary hemorrhagic telangiectasia: Secondary | ICD-10-CM

## 2017-11-08 LAB — PREPARE RBC (CROSSMATCH)

## 2017-11-08 LAB — SAMPLE TO BLOOD BANK

## 2017-11-08 LAB — CBC WITH DIFFERENTIAL/PLATELET
Abs Immature Granulocytes: 0 10*3/uL (ref 0.0–0.1)
BASOS PCT: 0 %
Basophils Absolute: 0 10*3/uL (ref 0.0–0.1)
EOS ABS: 0.2 10*3/uL (ref 0.0–0.7)
EOS PCT: 5 %
HEMATOCRIT: 25 % — AB (ref 36.0–46.0)
Hemoglobin: 7.6 g/dL — ABNORMAL LOW (ref 12.0–15.0)
Immature Granulocytes: 0 %
LYMPHS ABS: 0.4 10*3/uL — AB (ref 0.7–4.0)
Lymphocytes Relative: 9 %
MCH: 31.9 pg (ref 26.0–34.0)
MCHC: 30.4 g/dL (ref 30.0–36.0)
MCV: 105 fL — ABNORMAL HIGH (ref 78.0–100.0)
Monocytes Absolute: 0.4 10*3/uL (ref 0.1–1.0)
Monocytes Relative: 9 %
Neutro Abs: 3.4 10*3/uL (ref 1.7–7.7)
Neutrophils Relative %: 77 %
PLATELETS: 162 10*3/uL (ref 150–400)
RBC: 2.38 MIL/uL — ABNORMAL LOW (ref 3.87–5.11)
RDW: 16.9 % — AB (ref 11.5–15.5)
WBC: 4.5 10*3/uL (ref 4.0–10.5)

## 2017-11-08 NOTE — Addendum Note (Signed)
Addended by: Truddie Crumble on: 11/08/2017 11:23 AM   Modules accepted: Orders

## 2017-11-08 NOTE — Addendum Note (Signed)
Addended by: Truddie Crumble on: 11/08/2017 11:25 AM   Modules accepted: Orders

## 2017-11-08 NOTE — Addendum Note (Signed)
Addended by: Ebbie Latus on: 11/08/2017 02:19 PM   Modules accepted: Orders

## 2017-11-08 NOTE — Telephone Encounter (Signed)
Great.  Thanks

## 2017-11-08 NOTE — Telephone Encounter (Signed)
Called pt - talked to Mr Tortorelli; informed "Hb 7.6. She will need blood this week".  Alden Server, [facility] Short Stay - appt scheduled for tomorrow 10/1 @ 0800 AM.  Mr Creppel stated this date/time will be ok with Mrs Frankland and him.

## 2017-11-08 NOTE — Telephone Encounter (Signed)
-----   Message from [name], MD sent at 11/08/2017  2:12 PM EDT ----- Call pt: Hb 7.6. She will need blood this week. I put in orders. Please find a bed for her. If bed available tomorrow or Wed at [facility], have our lab convert her type and screen to type & cross on today's sample. Thanks DrG

## 2017-11-09 ENCOUNTER — Ambulatory Visit (HOSPITAL_COMMUNITY)
Admission: RE | Admit: 2017-11-09 | Discharge: 2017-11-09 | Disposition: A | Discharge status: Home / Self Care | Payer: Medicare Other | Source: Ambulatory Visit | Attending: Oncology | Admitting: Oncology

## 2017-11-09 DIAGNOSIS — D500 Iron deficiency anemia secondary to blood loss (chronic): Secondary | ICD-10-CM | POA: Diagnosis not present

## 2017-11-09 DIAGNOSIS — I780 Hereditary hemorrhagic telangiectasia: Secondary | ICD-10-CM | POA: Insufficient documentation

## 2017-11-09 DIAGNOSIS — K31819 Angiodysplasia of stomach and duodenum without bleeding: Secondary | ICD-10-CM | POA: Diagnosis not present

## 2017-11-09 MED ORDER — HEPARIN SOD (PORK) LOCK FLUSH 100 UNIT/ML IV SOLN
INTRAVENOUS | Status: AC
  Administered 2017-11-09: 500 [IU]
  Filled 2017-11-09: qty 5

## 2017-11-09 MED ORDER — HEPARIN SOD (PORK) LOCK FLUSH 100 UNIT/ML IV SOLN
500.0000 [IU] | INTRAVENOUS | Status: AC | PRN
  Administered 2017-11-09: 500 [IU]

## 2017-11-09 MED ORDER — SODIUM CHLORIDE 0.9% IV SOLUTION: Freq: Once | INTRAVENOUS | Status: DC

## 2017-11-10 ENCOUNTER — Ambulatory Visit: Payer: Medicare Other | Admitting: Adult Health

## 2017-11-10 ENCOUNTER — Encounter: Payer: Self-pay | Admitting: Adult Health

## 2017-11-10 VITALS — BP 147/52 | HR 67 | Ht 63.0 in | Wt 161.0 lb

## 2017-11-10 DIAGNOSIS — R413 Other amnesia: Secondary | ICD-10-CM | POA: Diagnosis not present

## 2017-11-10 DIAGNOSIS — R569 Unspecified convulsions: Secondary | ICD-10-CM

## 2017-11-10 LAB — BPAM RBC
BLOOD PRODUCT EXPIRATION DATE: 201910252359
BLOOD PRODUCT EXPIRATION DATE: 201910252359
ISSUE DATE / TIME: 201910011054
ISSUE DATE / TIME: 201910010830
UNIT TYPE AND RH: 6200
UNIT TYPE AND RH: 6200

## 2017-11-10 LAB — TYPE AND SCREEN
ABO/RH(D): A POS
ANTIBODY SCREEN: NEGATIVE
Unit division: 0
Unit division: 0

## 2017-11-10 MED ORDER — MEMANTINE HCL 10 MG PO TABS: 10.0000 mg | ORAL_TABLET | Freq: Two times a day (BID) | ORAL | 4 refills | Status: DC

## 2017-11-10 MED ORDER — DONEPEZIL HCL 10 MG PO TABS: 10.0000 mg | ORAL_TABLET | Freq: Every day | ORAL | 4 refills | Status: DC

## 2017-11-10 MED ORDER — LEVETIRACETAM 500 MG PO TABS: 500.0000 mg | ORAL_TABLET | Freq: Two times a day (BID) | ORAL | 4 refills | Status: DC

## 2017-11-10 NOTE — Progress Notes (Signed)
PATIENT: [name] DOB: [birth year]  REASON FOR VISIT: follow up HISTORY FROM: patient  HISTORY OF PRESENT ILLNESS: Today 11/10/17: [name] is a 77 year old female with a history of seizures and memory disturbance.  She returns today for follow-up.  She reports that she has not had any seizure events.  She lives at home with her husband.  She is able to complete all ADLs independently.  She does not operate a motor vehicle.  She reports that she does have trouble sleeping at night.  She often takes naps during the day because of this.  Reports good appetite.  States that she continues to prepare some meals.  Her husband manages all the finances.  She denies any changes in her mood or behavior.  Interval History 11/10/2016: She is doing well, memory is stable, no new deficits, no falls, her legs hurt her and she is going to have a stent in her leg for artery disease. She is not depressed, mood is good, she feels good. No difficulty swallowing.  05/06/16 [name] a 77 year old female with a history of memory disturbance and seizures. She returns today for follow-up. She reports that she has been doing well. Denies any seizure events. She continues on Keppra 500 mg twice a day. She reports that her memory varies. She lives at home with her husband. He is able to complete all ADLs independently. Her husband handles all the finances. He also helps her oversee her medication and appointments. She does not operate a motor vehicle. She is able to cook without difficulty. She states that some nights she is unable to sleep well. Denies any hallucinations. She returns today for an evaluation.  HISTORY 11/07/15: [name] is a 77 year old female with a history of memory disturbance. She returns today for follow-up. She feels that her memory has remained stable. She continues on Aricept. At the last visit she was started on Namenda. Reports that she is tolerating this well. She denies any seizure  events. She remains on Keppra. She is able to complete most ADLs without assistance. She does not operate a motor vehicle. Denies any changes with her gait or balance. Denies any changes with her mood or behavior. Patient's husband is in the lobby but does not want to come to the exam room? Patient reports that she was admitted to the hospital in August due to a GI bleed. She denies any other medical history. Returns today for an evaluation.  08/07/15 (MM) : [name] is a 77 year old female with a history of memory disturbance. She returns today for follow-up. She states that her memory has remained stable. She is tolerating Aricept well. She lives at home with her husband. She states that Keppra does make her sleepy throughout the day. She is not had any seizure events. She does not operate a motor vehicle. She does prepare meals without difficulty. She states that she is trying to get Meals on Wheels but is on the wait list. She returns today for an evaluation.  01/30/15 [facility]): She is more forgetful. She forgets things she says, by the time she tella her husband something she has forgotten. They live independently. Pays all the bills, not skipping bills. No accidents in the home. She is losing things. Misplacing. Forgets appointments. Difficulty with organization more. Can't get herself together. She sleeps a lot. No dementia, no alzheimers in the family.   HPI: [name] is a 77 y.o. female here as a referral from Dr. Jeanie Cooks for  seizure in the setting of hyperammonemia. She has a past medical history of hypertension, congestive heart failure, diabetes, acute encephalopathy with status epilepticus, acute renal failure superimposed on stage III chronic kidney disease, urinary tract infections, hyperammonemia.  Patient here with husband. She was admitted with an episode of seizure-like activity. Husband provides assist of the information. He describes the incident. She was shaking her hands  and head, trying to catch her breath. All information provided by husband. Lasted a few minutes. She was not responsive. It was on a Tuesday. Then she slept for 5 hours afterwards. Then she vomited while sleeping and then they called 911 and went to the emergency room. This is not the first time it has happened. It has happened in the past. Husband had seen the jerking of the limbs and then she would sleep a while and wake up. He says he has seen in twice in the last year. No family history of seizures. She is much better now. Therapy is coming to her house. Not having any side effects from the medication. She has had episodes of AMS and confusion in the past.   Reviewed notes, labs and imaging from outside physicians, which showed:  MRI of the brain 08/2014 personally reviewed images and agree with following report. : 1. No acute infarct identified. 2. Increased basal ganglia intrinsic T1 signal which can be seen with hepatic insufficiency, cirrhosis, portosystemic shunt, portal vein thrombosis, total parenteral nutrition, and is less often seen with hyperglycemia or other metabolic disturbances such as Wilson's disease. Correlation with serum ammonia levels and liver function tests is recommended. 3. Advanced but nonspecific cerebral white matter signal changes. Most commonly this is due to chronic small vessel disease. Other considerations include hypercoagulable state, vasculitis, , prior infection or demyelination.  CBC showed anemia, anemia while hospitalized 327. Hepatic function was normal. One month previous she had a GI bleed. Lactulose was started for elevated ammonia. An EEG was ordered which was concerning for frontal status epilepticus. Hyperammonemia was thought to be the driver of the seizures however she was started on Keppra. She was discharged with the diagnosis of acute encephalopathy and hyperammonemia, she was discharged on Keppra 500 mg twice daily.  Patient presented to the  emergency room in late August with altered mental status and lethargy. CT of the head imaging was unremarkable patient was afebrile, no leukocytosis, ammonia was 327, BUN was 32 and creatinine was 1.38.  REVIEW OF SYSTEMS: Out of a complete 14 system review of symptoms, the patient complains only of the following symptoms, and all other reviewed systems are negative.  Joint pain, aching muscles, walking difficulty, rash, itching, restless leg  ALLERGIES: Allergies  Allergen Reactions  . Aspirin Nausea And Vomiting    HOME MEDICATIONS: Outpatient Medications Prior to Visit  Medication Sig Dispense Refill  . b complex vitamins capsule Take 1 capsule by mouth daily. 100 capsule 2  . Blood Glucose Monitoring Suppl (ONETOUCH VERIO) w/Device KIT     . cetaphil (CETAPHIL) lotion Apply 1 application topically as needed for dry skin. 236 mL 0  . donepezil (ARICEPT) 10 MG tablet Take 1 tablet (10 mg total) by mouth at bedtime. 90 tablet 4  . Elastic Bandages & Supports (MEDICAL COMPRESSION STOCKINGS) MISC Wear as much as possible while awake to reduce swelling 2 each 0  . FERROCITE 324 MG TABS tablet TAKE 1 TABLET BY MOUTH TWICE DAILY 180 tablet 3  . furosemide (LASIX) 20 MG tablet TAKE 1 TABLET BY MOUTH  ONCE DAILY 30 tablet 9  . glucose blood (ONETOUCH VERIO) test strip Use as instructed 3-4 times daily. E11.29, insulin requiring 100 each 12  . glucose blood (ONETOUCH VERIO) test strip Check blood sugar 4 times a day 350 each 4  . insulin degludec (TRESIBA FLEXTOUCH) 100 UNIT/ML SOPN FlexTouch Pen Inject 0.2 mLs (20 Units total) into the skin every morning. 6 mL 3  . Insulin Pen Needle (B-D UF III MINI PEN NEEDLES) 31G X 5 MM MISC USE TO INJECT INSULIN 3 TIMES DAILY 100 each 11  . lactulose (CHRONULAC) 10 GM/15ML solution TAKE 15 ML BY MOUTH TWO TIMES DAILY 473 mL 2  . levETIRAcetam (KEPPRA) 500 MG tablet Take 1 tablet (500 mg total) by mouth 2 (two) times daily. 180 tablet 4  . memantine  (NAMENDA) 10 MG tablet Take 1 tablet (10 mg total) by mouth 2 (two) times daily. 180 tablet 4  . ONETOUCH DELICA LANCETS FINE MISC Check blood sugar up to 4 times a day 300 each 4  . pantoprazole (PROTONIX) 40 MG tablet Take 1 tablet (40 mg total) by mouth 2 (two) times daily. 60 tablet 1  . potassium chloride (K-DUR) 10 MEQ tablet Take 1 tablet (10 mEq total) by mouth daily. 30 tablet 11  . potassium chloride SA (KLOR-CON M20) 20 MEQ tablet TAKE 1 TABLET BY MOUTH ONCE DAILY 30 tablet 5  . pramipexole (MIRAPEX) 0.125 MG tablet TAKE 1 TABLET BY MOUTH ONCE DAILY 90 tablet 2  . Semaglutide,0.25 or 0.'5MG'$ /DOS, (OZEMPIC, 0.25 OR 0.5 MG/DOSE,) 2 MG/1.5ML SOPN Inject 0.5 mg into the skin once a week. 1.5 mL 3  . sodium chloride (OCEAN) 0.65 % SOLN nasal spray Place 1 spray into both nostrils as needed for congestion. 1 Bottle 5  . triamcinolone ointment (KENALOG) 0.5 % APPLY 1 APPLICATION TOPICALLY TO RASH TWICE DAILY FOR ITCHING 15 g 1  . calcium citrate-vitamin D (CITRACAL+D) 315-200 MG-UNIT tablet Take 1 tablet by mouth 2 (two) times daily. 100 tablet 0  . oxymetazoline (AFRIN) 0.05 % nasal spray Place 1 spray into both nostrils 2 (two) times daily as needed (Epistaxis). (Patient not taking: Reported on 11/10/2017) 30 mL 0   No facility-administered medications prior to visit.     PAST MEDICAL HISTORY: Past Medical History:  Diagnosis Date  . Chronic anemia   . Chronic diastolic CHF (congestive heart failure) ([place]) 10/03/2013  . Chronic GI bleeding    [name] 11/29/2014  . Family history of anesthesia complication    "niece has a hard time coming out" (09/15/2012)  . Frequent nosebleeds    chronic  . Gastric AV malformation    [name] 11/29/2014  . GERD (gastroesophageal reflux disease)   . 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 History of blood transfusion "several"  . History of epistaxis   . HTN (hypertension), benign 03/02/2012  . Hyperlipidemia   . Iron deficiency anemia     chronic infusions"  . Lichen planus    Both lower extremities  . 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 Overgrown toenails 03/18/2017  . Pneumonia 1990's X 2  . Pulmonary HTN ([place]) 04/01/2017   PASP 35mHg on echo 09/2016  . [place]  . Symptomatic anemia 11/29/2014  . Telangiectasia    Gastric   . 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    PAST SURGICAL HISTORY: Past Surgical History:  Procedure Laterality Date  . CATARACT EXTRACTION     "  I think it was just one eye"  . ESOPHAGOGASTRODUODENOSCOPY  02/26/2011   Procedure: ESOPHAGOGASTRODUODENOSCOPY (EGD);  Surgeon: [name], MD;  Location: [name];  Service: Endoscopy;  Laterality: N/A;  . ESOPHAGOGASTRODUODENOSCOPY N/A 11/08/2012   Procedure: ESOPHAGOGASTRODUODENOSCOPY (EGD);  Surgeon: [name], MD;  Location: [name];  Service: Endoscopy;  Laterality: N/A;  . ESOPHAGOGASTRODUODENOSCOPY N/A 10/04/2013   Procedure: ESOPHAGOGASTRODUODENOSCOPY (EGD);  Surgeon: Winfield Cunas., MD;  Location: [name];  Service: Endoscopy;  Laterality: N/A;  with APC on stand-by  . ESOPHAGOGASTRODUODENOSCOPY N/A 07/06/2014   Procedure: ESOPHAGOGASTRODUODENOSCOPY (EGD);  Surgeon: Clarene Essex, MD;  Location: [name];  Service: Endoscopy;  Laterality: N/A;  . ESOPHAGOGASTRODUODENOSCOPY N/A 09/05/2014   Procedure: ESOPHAGOGASTRODUODENOSCOPY (EGD);  Surgeon: Laurence Spates, MD;  Location: [name];  Service: Endoscopy;  Laterality: N/A;  APC on standby to control bleeding  . ESOPHAGOGASTRODUODENOSCOPY N/A 11/29/2014   Procedure: ESOPHAGOGASTRODUODENOSCOPY (EGD);  Surgeon: [name], MD;  Location[facility] END[facility]COPY;  Service: Endoscopy;  Laterality: N/A;  . ESOPHAGOGASTRODUODENOSCOPY N/A 09/28/2015   Procedure: ESOPHAGOGASTRODUODENOSCOPY (EGD);  Surgeon: Clarene Essex, MD;  Location: [facility] ENDOSCOPY;  Service: Endoscopy;  Laterality: N/A;  . ESOPHAGOGASTRODUODENOSCOPY ENDOSCOPY   08/19/2006   with laser treatment  . HOT HEMOSTASIS N/A 07/06/2014   Procedure: HOT HEMOSTASIS (ARGON PLASMA COAGULATION/BICAP);  Surgeon: Clarene Essex, MD;  Location: [name];  Service: Endoscopy;  Laterality: N/A;  . HOT HEMOSTASIS N/A 09/28/2015   Procedure: HOT HEMOSTASIS (ARGON PLASMA COAGULATION/BICAP);  Surgeon: Clarene Essex, MD;  Location: [facility] ENDOSCOPY;  Service: Endoscopy;  Laterality: N/A;  . NASAL HEMORRHAGE CONTROL     "for bleeding"   . SAVORY DILATION  02/26/2011   Procedure: SAVORY DILATION;  Surgeon: [name], MD;  Location: [name];  Service: Endoscopy;  Laterality: N/A;  c-arm needed    FAMILY HISTORY: Family History  Problem Relation Age of Onset  . Stroke Father   . Breast cancer Unknown   . Malignant hyperthermia Neg Hx   . Seizures Neg Hx     SOCIAL HISTORY: Social History   Socioeconomic History  . Marital status: Married    Spouse name: [name]  . Number of children: 0  . Years of education: 9  . Highest education level: Not on file  Occupational History  . Occupation: Retired Systems developer: RETIRED  Social Needs  . Financial resource strain: Not on file  . Food insecurity:    Worry: Not on file    Inability: Not on file  . Transportation needs:    Medical: Not on file    Non-medical: Not on file  Tobacco Use  . Smoking status: Former Smoker    Packs/day: 1.00    Years: 20.00    Pack years: 20.00    Types: Cigarettes    Last attempt to quit: 02/10/1971    Years since quitting: 46.7  . Smokeless tobacco: Never Used  . Tobacco comment: 09/15/2012 "smoked 50-60 yr ago"  Substance and Sexual Activity  . Alcohol use: No    Alcohol/week: 0.0 standard drinks  . Drug use: No  . Sexual activity: Not on file  Lifestyle  . Physical activity:    Days per week: Not on file    Minutes per session: Not on file  . Stress: Not on file  Relationships  . Social connections:    Talks on phone: Not on file    Gets together: Not on file      Attends religious service:  Not on file    Active member of club or organization: Not on file    Attends meetings of clubs or organizations: Not on file    Relationship status: Not on file  . Intimate partner violence:    Fear of current or ex partner: Not on file    Emotionally abused: Not on file    Physically abused: Not on file    Forced sexual activity: Not on file  Other Topics Concern  . Not on file  Social History Narrative   Married, lives with husband, [name].  Ambulates without assistance.     Caffeine use: none      PHYSICAL EXAM  Vitals:   11/10/17 1249  BP: (!) 147/52  Pulse: 67  Weight: 161 lb (73 kg)  Height: '5\' 3"'$  (1.6 m)   Body mass index is 28.52 kg/m.   MMSE - Mini Mental State Exam 11/10/2017 11/10/2016 05/06/2016  Orientation to time '5 4 3  '$ Orientation to Place '5 3 4  '$ Registration '3 3 3  '$ Attention/ Calculation 0 0 0  Recall '2 2 1  '$ Language- name 2 objects '2 2 2  '$ Language- repeat '1 1 1  '$ Language- follow 3 step command '3 3 3  '$ Language- read & follow direction '1 1 1  '$ Write a sentence '1 1 1  '$ Copy design 0 0 0  Total score '23 20 19     '$ Generalized: Well developed, in no acute distress   Neurological examination  Mentation: Alert oriented to time, place, history taking. Follows all commands speech and language fluent Cranial nerve II-XII: Pupils were equal round reactive to light. Extraocular movements were full, visual field were full on confrontational test. Facial sensation and strength were normal. Uvula tongue midline. Head turning and shoulder shrug  were normal and symmetric. Motor: The motor testing reveals 5 over 5 strength of all 4 extremities. Good symmetric motor tone is noted throughout.  Sensory: Sensory testing is intact to soft touch on all 4 extremities. No evidence of extinction is noted.  Coordination: Cerebellar testing reveals good finger-nose-finger and heel-to-shin bilaterally.  Gait and station: Gait is normal.  Reflexes:  Deep tendon reflexes are symmetric and normal bilaterally.   DIAGNOSTIC DATA (LABS, IMAGING, TESTING) - I reviewed patient records, labs, notes, testing and imaging myself where available.  Lab Results  Component Value Date   WBC 4.5 11/08/2017   HGB 7.6 (L) 11/08/2017   HCT 25.0 (L) 11/08/2017   MCV 105.0 (H) 11/08/2017   PLT 162 11/08/2017      Component Value Date/Time   NA 141 10/04/2017 1120   NA 144 04/04/2014 1019   K 4.8 10/04/2017 1120   K 4.5 04/04/2014 1019   CL 105 10/04/2017 1120   CL 111 (H) 12/28/2011 0825   CO2 22 10/04/2017 1120   CO2 26 04/04/2014 1019   GLUCOSE 145 (H) 10/04/2017 1120   GLUCOSE 180 (H) 10/26/2016 1115   GLUCOSE 156 (H) 04/04/2014 1019   GLUCOSE 152 (H) 12/28/2011 0825   BUN 18 10/04/2017 1120   BUN 16.9 04/04/2014 1019   CREATININE 1.09 (H) 10/04/2017 1120   CREATININE 0.92 01/28/2015 0811   CREATININE 0.9 04/04/2014 1019   CALCIUM 10.8 (H) 10/04/2017 1120   CALCIUM 9.3 04/04/2014 1019   PROT 5.7 (L) 10/04/2017 1120   PROT 6.4 04/04/2014 1019   ALBUMIN 3.5 10/04/2017 1120   ALBUMIN 3.3 (L) 04/04/2014 1019   AST 23 10/04/2017 1120   AST 30 04/04/2014 1019  ALT 17 10/04/2017 1120   ALT 25 04/04/2014 1019   ALKPHOS 77 10/04/2017 1120   ALKPHOS 140 04/04/2014 1019   BILITOT 0.3 10/04/2017 1120   BILITOT 0.43 04/04/2014 1019   GFRNONAA 49 (L) 10/04/2017 1120   GFRAA 57 (L) 10/04/2017 1120   Lab Results  Component Value Date   CHOL 141 11/29/2014   HDL 31 (L) 11/29/2014   LDLCALC 82 11/29/2014   TRIG 141 11/29/2014   CHOLHDL 4.5 11/29/2014   Lab Results  Component Value Date   HGBA1C 5.9 (A) 07/15/2017   Lab Results  Component Value Date   KGURKYHC62 376 11/29/2014   Lab Results  Component Value Date   TSH 1.730 01/30/2015      ASSESSMENT AND PLAN 77 y.o. 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 here with:  1.  Memory disturbance 2.  Seizures  Overall the patient is doing well.  Her memory score has remained stable.  She will continue on Aricept and Namenda.  Patient "thinks" she is still taking this medication.  She plans to check her prescriptions when she gets home.  She will continue on Keppra 500 mg twice a day.  She is advised that if her symptoms worsen or she develops new symptoms she should let us know.   Ward Givens, MSN, NP-C 11/10/2017, 1:17 PM [facility] [address] [place], [place] [address] [phone]

## 2017-11-10 NOTE — Patient Instructions (Signed)
Your Plan:  Continue Aricept and Namenda ( check at home to ensure you are still taking) Memory score is stable Continue Keppra for seizures If your symptoms worsen or you develop new symptoms please let us know.   Thank you for coming to see [place] at [facility]. I hope we have been able to provide you high quality care today.  You may receive a patient satisfaction survey over the next few weeks. We would appreciate your feedback and comments so that we may continue to improve ourselves and the health of our patients.

## 2017-11-11 ENCOUNTER — Encounter: Payer: Self-pay | Admitting: Adult Health

## 2017-11-15 ENCOUNTER — Other Ambulatory Visit (INDEPENDENT_AMBULATORY_CARE_PROVIDER_SITE_OTHER): Payer: Medicare Other

## 2017-11-15 DIAGNOSIS — D500 Iron deficiency anemia secondary to blood loss (chronic): Secondary | ICD-10-CM

## 2017-11-15 DIAGNOSIS — I780 Hereditary hemorrhagic telangiectasia: Secondary | ICD-10-CM

## 2017-11-15 DIAGNOSIS — K31819 Angiodysplasia of stomach and duodenum without bleeding: Secondary | ICD-10-CM | POA: Diagnosis not present

## 2017-11-15 LAB — CBC WITH DIFFERENTIAL/PLATELET
ABS IMMATURE GRANULOCYTES: 0 10*3/uL (ref 0.0–0.1)
BASOS ABS: 0 10*3/uL (ref 0.0–0.1)
BASOS PCT: 1 %
Eosinophils Absolute: 0.2 10*3/uL (ref 0.0–0.7)
Eosinophils Relative: 5 %
HCT: 31.4 % — ABNORMAL LOW (ref 36.0–46.0)
HEMOGLOBIN: 9.7 g/dL — AB (ref 12.0–15.0)
Immature Granulocytes: 0 %
LYMPHS PCT: 8 %
Lymphs Abs: 0.4 10*3/uL — ABNORMAL LOW (ref 0.7–4.0)
MCH: 30.7 pg (ref 26.0–34.0)
MCHC: 30.9 g/dL (ref 30.0–36.0)
MCV: 99.4 fL (ref 78.0–100.0)
MONO ABS: 0.3 10*3/uL (ref 0.1–1.0)
Monocytes Relative: 7 %
NEUTROS ABS: 3.7 10*3/uL (ref 1.7–7.7)
Neutrophils Relative %: 79 %
Platelets: 144 10*3/uL — ABNORMAL LOW (ref 150–400)
RBC: 3.16 MIL/uL — AB (ref 3.87–5.11)
RDW: 17.9 % — AB (ref 11.5–15.5)
WBC: 4.7 10*3/uL (ref 4.0–10.5)

## 2017-11-15 LAB — SAMPLE TO BLOOD BANK

## 2017-11-15 LAB — FERRITIN: Ferritin: 59 ng/mL (ref 11–307)

## 2017-11-15 NOTE — Addendum Note (Signed)
Addended by: Truddie Crumble on: 11/15/2017 05:00 PM   Modules accepted: Orders

## 2017-11-15 NOTE — Progress Notes (Signed)
Made any corrections needed, and agree with history, physical, neuro exam,assessment and plan as stated above.     Jaikob Borgwardt, MD [facility] 

## 2017-11-16 ENCOUNTER — Telehealth: Payer: Self-pay | Admitting: *Deleted

## 2017-11-16 ENCOUNTER — Other Ambulatory Visit: Payer: Self-pay | Admitting: Oncology

## 2017-11-16 DIAGNOSIS — I780 Hereditary hemorrhagic telangiectasia: Secondary | ICD-10-CM

## 2017-11-16 DIAGNOSIS — K31819 Angiodysplasia of stomach and duodenum without bleeding: Secondary | ICD-10-CM

## 2017-11-16 DIAGNOSIS — D500 Iron deficiency anemia secondary to blood loss (chronic): Secondary | ICD-10-CM

## 2017-11-16 NOTE — Telephone Encounter (Signed)
-----   Message from [name], MD sent at 11/15/2017 12:00 PM EDT ----- Call pt: blood count good. No blood this week

## 2017-11-16 NOTE — Telephone Encounter (Signed)
Pt called / informed "blood count good (@ 9.7). No blood this week." per Dr [name].

## 2017-11-18 ENCOUNTER — Encounter: Payer: Self-pay | Admitting: Internal Medicine

## 2017-11-18 ENCOUNTER — Ambulatory Visit (INDEPENDENT_AMBULATORY_CARE_PROVIDER_SITE_OTHER): Payer: Medicare Other | Admitting: Internal Medicine

## 2017-11-18 ENCOUNTER — Other Ambulatory Visit: Payer: Self-pay

## 2017-11-18 VITALS — BP 130/57 | HR 57 | Temp 98.0°F | Ht 63.0 in | Wt 157.1 lb

## 2017-11-18 DIAGNOSIS — N182 Chronic kidney disease, stage 2 (mild): Secondary | ICD-10-CM

## 2017-11-18 DIAGNOSIS — E1129 Type 2 diabetes mellitus with other diabetic kidney complication: Secondary | ICD-10-CM

## 2017-11-18 DIAGNOSIS — N289 Disorder of kidney and ureter, unspecified: Secondary | ICD-10-CM

## 2017-11-18 DIAGNOSIS — Q2733 Arteriovenous malformation of digestive system vessel: Secondary | ICD-10-CM

## 2017-11-18 DIAGNOSIS — R040 Epistaxis: Secondary | ICD-10-CM

## 2017-11-18 DIAGNOSIS — K5900 Constipation, unspecified: Secondary | ICD-10-CM

## 2017-11-18 DIAGNOSIS — D500 Iron deficiency anemia secondary to blood loss (chronic): Secondary | ICD-10-CM

## 2017-11-18 DIAGNOSIS — Z9889 Other specified postprocedural states: Secondary | ICD-10-CM

## 2017-11-18 DIAGNOSIS — I780 Hereditary hemorrhagic telangiectasia: Secondary | ICD-10-CM

## 2017-11-18 DIAGNOSIS — G479 Sleep disorder, unspecified: Secondary | ICD-10-CM

## 2017-11-18 DIAGNOSIS — R011 Cardiac murmur, unspecified: Secondary | ICD-10-CM

## 2017-11-18 DIAGNOSIS — I10 Essential (primary) hypertension: Secondary | ICD-10-CM | POA: Diagnosis not present

## 2017-11-18 DIAGNOSIS — I739 Peripheral vascular disease, unspecified: Secondary | ICD-10-CM

## 2017-11-18 DIAGNOSIS — E1151 Type 2 diabetes mellitus with diabetic peripheral angiopathy without gangrene: Secondary | ICD-10-CM | POA: Diagnosis not present

## 2017-11-18 DIAGNOSIS — E1165 Type 2 diabetes mellitus with hyperglycemia: Secondary | ICD-10-CM

## 2017-11-18 DIAGNOSIS — Z794 Long term (current) use of insulin: Secondary | ICD-10-CM

## 2017-11-18 DIAGNOSIS — Z79899 Other long term (current) drug therapy: Secondary | ICD-10-CM

## 2017-11-18 DIAGNOSIS — E7220 Disorder of urea cycle metabolism, unspecified: Secondary | ICD-10-CM

## 2017-11-18 DIAGNOSIS — IMO0002 Reserved for concepts with insufficient information to code with codable children: Secondary | ICD-10-CM

## 2017-11-18 MED ORDER — LACTULOSE 10 GM/15ML PO SOLN: ORAL | 2 refills | Status: DC

## 2017-11-18 MED ORDER — INSULIN DEGLUDEC 100 UNIT/ML ~~LOC~~ SOPN: 22.0000 [IU] | PEN_INJECTOR | SUBCUTANEOUS | 3 refills | Status: DC

## 2017-11-18 NOTE — Patient Instructions (Signed)
[name], It was a pleasure meeting you today! I look forward to serving as your doctor.   For your Diabetes, I am increasing the Tresiba to 22 units every morning. Continue taking the Ozempic 0.5 mg every week.   For sleep, remember to make your bedroom dark and quiet with limited television time. If you still are unable to sleep, you can take an over the counter supplement called melatonin to take every night.   For constipation, you can take Miralax once a day as needed in addition to your Lactulose.   We will plan to see you back in 2 weeks to see how your blood sugars are doing.   Take care, and look forward to seeing you soon!

## 2017-11-19 LAB — BMP8+ANION GAP
ANION GAP: 13 mmol/L (ref 10.0–18.0)
BUN/Creatinine Ratio: 20 (ref 12–28)
BUN: 20 mg/dL (ref 8–27)
CALCIUM: 9.3 mg/dL (ref 8.7–10.3)
CHLORIDE: 105 mmol/L (ref 96–106)
CO2: 27 mmol/L (ref 20–29)
Creatinine, Ser: 1 mg/dL (ref 0.57–1.00)
GFR calc Af Amer: 63 mL/min/{1.73_m2} (ref >59)
GFR, EST NON AFRICAN AMERICAN: 54 mL/min/{1.73_m2} — AB (ref >59)
GLUCOSE: 123 mg/dL — AB (ref 65–99)
POTASSIUM: 4.1 mmol/L (ref 3.5–5.2)
SODIUM: 145 mmol/L — AB (ref 134–144)

## 2017-11-20 NOTE — Progress Notes (Signed)
   CC: HTN, DM  HPI:  [name] is a 77 y.o. female with PMHx listed below who presents for follow-up on her chronic medical conditions. Please see encounters tab for problem based charting.   Past Medical History:  Diagnosis Date  . Chronic anemia   . Chronic diastolic CHF (congestive heart failure) ([place]) 10/03/2013  . Chronic GI bleeding    [name] 11/29/2014  . Family history of anesthesia complication    "niece has a hard time coming out" (09/15/2012)  . Frequent nosebleeds    chronic  . Gastric AV malformation    [name] 11/29/2014  . GERD (gastroesophageal reflux disease)   . 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 History of blood transfusion "several"  . History of epistaxis   . HTN (hypertension), benign 03/02/2012  . Hyperlipidemia   . Iron deficiency anemia    chronic infusions"  . Lichen planus    Both lower extremities  . 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 Overgrown toenails 03/18/2017  . Pneumonia 1990's X 2  . Pulmonary HTN ([place]) 04/01/2017   PASP 69mmHg on echo 09/2016  . Seizures ([place]) 09/2014  . Symptomatic anemia 11/29/2014  . Telangiectasia    Gastric   . 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   Review of Systems:   Constitutional: no fevers, chills, light headedness or syncope; endorses difficulty sleeping at night HEENT: intermittent nosebleeds CV: no chest pain or palpitations; endorses claudication symptoms in both legs Resp: no shortness of breath or hemoptysis  GI: endorses constipation; no abdominal pain, n/v, hematochezia   Physical Exam:  Vitals:   11/18/17 1321  BP: (!) 130/57  Pulse: (!) 57  Temp: 98 F (36.7 C)  TempSrc: Oral  SpO2: 99%  Weight: 157 lb 1.6 oz (71.3 kg)  Height: 5\' 3"  (1.6 m)   General: alert, pleasant female, appears stated age, NAD CV: RRR; SEM heard best at LUSB  Pulm: normal respiratory effort; lungs CTA bilaterall Ext: non-pitting edema in  BLE Skin: warm and dry; scattered plaques on bilateral lower extremities Psych: appropriate mood and affect   Assessment & Plan:   See Encounters Tab for problem based charting.  Patient seen with Dr. Daryll Drown

## 2017-11-21 ENCOUNTER — Encounter: Payer: Self-pay | Admitting: Internal Medicine

## 2017-11-21 NOTE — Assessment & Plan Note (Signed)
Blood pressure well controlled on current regimen. Denies headaches, vision changes, chest pain or dyspnea. Continue Lasix 20 mg. BMET this visit with stable creatinine (1.0).

## 2017-11-21 NOTE — Assessment & Plan Note (Signed)
Patient has a history of gastric AVMs in the setting of HHT. She gets weekly CBCs with outpatient transfusions as needed. Her last transfusion was 10/1. CBC this week showed hgb 9.7. She denies any hematochezia, melena, or hematemesis. She is on iron supplementation. Endorses constipation. Instructed her to use Miralax as needed along with prescribed lactulose.

## 2017-11-21 NOTE — Assessment & Plan Note (Signed)
Review of patient's CBGs shows fasting blood sugars ranging from 160s-300. Her Ozempic was increased to 0.5 at visit 2 weeks ago. It is recommended that this medication be adjusted every 4 weeks so we will continue on current dosing for now. Increase Tresiba from 20 to 22 units every morning. Will bring her back in 2 weeks to evaluate CBGs on adjusted regimen.

## 2017-11-21 NOTE — Assessment & Plan Note (Signed)
Patient endorses symptoms of claudication in BLE. Referral placed to vascular surgery at last visit to determine whether she is a candidate for intervention versus medical management. Will follow-up their recommendations after her appointment next month.

## 2017-11-22 ENCOUNTER — Other Ambulatory Visit (INDEPENDENT_AMBULATORY_CARE_PROVIDER_SITE_OTHER): Payer: Medicare Other

## 2017-11-22 ENCOUNTER — Telehealth: Payer: Self-pay | Admitting: *Deleted

## 2017-11-22 DIAGNOSIS — D500 Iron deficiency anemia secondary to blood loss (chronic): Secondary | ICD-10-CM

## 2017-11-22 DIAGNOSIS — I780 Hereditary hemorrhagic telangiectasia: Secondary | ICD-10-CM | POA: Diagnosis not present

## 2017-11-22 DIAGNOSIS — K31819 Angiodysplasia of stomach and duodenum without bleeding: Secondary | ICD-10-CM

## 2017-11-22 LAB — CBC WITH DIFFERENTIAL/PLATELET
Abs Immature Granulocytes: 0.02 10*3/uL (ref 0.00–0.07)
BASOS ABS: 0 10*3/uL (ref 0.0–0.1)
Basophils Relative: 1 %
EOS ABS: 0.3 10*3/uL (ref 0.0–0.5)
EOS PCT: 6 %
HEMATOCRIT: 29.7 % — AB (ref 36.0–46.0)
Hemoglobin: 8.7 g/dL — ABNORMAL LOW (ref 12.0–15.0)
Immature Granulocytes: 0 %
LYMPHS ABS: 0.5 10*3/uL — AB (ref 0.7–4.0)
Lymphocytes Relative: 10 %
MCH: 29.7 pg (ref 26.0–34.0)
MCHC: 29.3 g/dL — AB (ref 30.0–36.0)
MCV: 101.4 fL — AB (ref 80.0–100.0)
Monocytes Absolute: 0.4 10*3/uL (ref 0.1–1.0)
Monocytes Relative: 8 %
NRBC: 0 % (ref 0.0–0.2)
Neutro Abs: 3.9 10*3/uL (ref 1.7–7.7)
Neutrophils Relative %: 75 %
Platelets: 208 10*3/uL (ref 150–400)
RBC: 2.93 MIL/uL — ABNORMAL LOW (ref 3.87–5.11)
RDW: 17.2 % — ABNORMAL HIGH (ref 11.5–15.5)
WBC: 5.2 10*3/uL (ref 4.0–10.5)

## 2017-11-22 LAB — SAMPLE TO BLOOD BANK

## 2017-11-22 NOTE — Progress Notes (Signed)
Internal Medicine Clinic Attending  I saw and evaluated the patient.  I personally confirmed the key portions of the history and exam documented by Dr. Bloomfield and I reviewed pertinent patient test results.  The assessment, diagnosis, and plan were formulated together and I agree with the documentation in the resident's note.  

## 2017-11-22 NOTE — Telephone Encounter (Signed)
-----   Message from [name], MD sent at 11/22/2017 12:55 PM EDT ----- Call pt: no blood this week but will need next week

## 2017-11-22 NOTE — Telephone Encounter (Signed)
Called pt - talked to her husband, informed " no blood this week but will need next week (hgb 8.7)" per Dr [name]. Stated alright and he will let her know.

## 2017-11-29 ENCOUNTER — Other Ambulatory Visit: Payer: Self-pay | Admitting: *Deleted

## 2017-11-29 ENCOUNTER — Other Ambulatory Visit (INDEPENDENT_AMBULATORY_CARE_PROVIDER_SITE_OTHER): Payer: Medicare Other

## 2017-11-29 ENCOUNTER — Telehealth: Payer: Self-pay | Admitting: *Deleted

## 2017-11-29 DIAGNOSIS — K31819 Angiodysplasia of stomach and duodenum without bleeding: Secondary | ICD-10-CM | POA: Diagnosis not present

## 2017-11-29 DIAGNOSIS — D500 Iron deficiency anemia secondary to blood loss (chronic): Secondary | ICD-10-CM

## 2017-11-29 DIAGNOSIS — I780 Hereditary hemorrhagic telangiectasia: Secondary | ICD-10-CM

## 2017-11-29 LAB — CBC WITH DIFFERENTIAL/PLATELET
Abs Immature Granulocytes: 0.02 10*3/uL (ref 0.00–0.07)
BASOS ABS: 0 10*3/uL (ref 0.0–0.1)
Basophils Relative: 1 %
EOS ABS: 0.4 10*3/uL (ref 0.0–0.5)
EOS PCT: 8 %
HCT: 26.5 % — ABNORMAL LOW (ref 36.0–46.0)
Hemoglobin: 7.9 g/dL — ABNORMAL LOW (ref 12.0–15.0)
IMMATURE GRANULOCYTES: 0 %
LYMPHS ABS: 0.5 10*3/uL — AB (ref 0.7–4.0)
LYMPHS PCT: 10 %
MCH: 31.1 pg (ref 26.0–34.0)
MCHC: 29.8 g/dL — AB (ref 30.0–36.0)
MCV: 104.3 fL — ABNORMAL HIGH (ref 80.0–100.0)
Monocytes Absolute: 0.4 10*3/uL (ref 0.1–1.0)
Monocytes Relative: 7 %
NEUTROS PCT: 74 %
NRBC: 0 % (ref 0.0–0.2)
Neutro Abs: 3.7 10*3/uL (ref 1.7–7.7)
PLATELETS: 217 10*3/uL (ref 150–400)
RBC: 2.54 MIL/uL — AB (ref 3.87–5.11)
RDW: 16.8 % — AB (ref 11.5–15.5)
WBC: 4.9 10*3/uL (ref 4.0–10.5)

## 2017-11-29 LAB — SAMPLE TO BLOOD BANK

## 2017-11-29 NOTE — Telephone Encounter (Signed)
Hgb 7.9 today - needs blood transfusion per Dr [name]. Appt scheduled for tomorrow at [facility] at 0800 AM - pt/husband called and informed of appt.

## 2017-11-30 ENCOUNTER — Ambulatory Visit (HOSPITAL_COMMUNITY)
Admission: RE | Admit: 2017-11-30 | Discharge: 2017-11-30 | Disposition: A | Discharge status: Home / Self Care | Payer: Medicare Other | Source: Ambulatory Visit | Attending: Oncology | Admitting: Oncology

## 2017-11-30 ENCOUNTER — Other Ambulatory Visit: Payer: Self-pay | Admitting: Oncology

## 2017-11-30 DIAGNOSIS — D649 Anemia, unspecified: Secondary | ICD-10-CM | POA: Insufficient documentation

## 2017-11-30 LAB — PREPARE RBC (CROSSMATCH)

## 2017-11-30 MED ORDER — SODIUM CHLORIDE 0.9% FLUSH
10.0000 mL | INTRAVENOUS | Status: AC | PRN
  Administered 2017-11-30: 10 mL

## 2017-11-30 MED ORDER — SODIUM CHLORIDE 0.9% IV SOLUTION: Freq: Once | INTRAVENOUS | Status: DC

## 2017-11-30 MED ORDER — HEPARIN SOD (PORK) LOCK FLUSH 100 UNIT/ML IV SOLN
500.0000 [IU] | INTRAVENOUS | Status: AC | PRN
  Administered 2017-11-30: 500 [IU]

## 2017-11-30 NOTE — Progress Notes (Signed)
PATIENT CARE CENTER NOTE  Diagnosis: Anemia    Provider: Dr. [name]   Procedure: 2 units PRBC's    Note: Patient received 2 units of blood. Patient tolerated transfusion well with no adverse reaction. Vital signs remained stable throughout transfusions. Discharge instructions given to patient. Patient alert, oriented and ambulatory at discharge. Discharged home with husband.

## 2017-11-30 NOTE — Discharge Instructions (Signed)

## 2017-12-01 LAB — TYPE AND SCREEN
ABO/RH(D): A POS
ANTIBODY SCREEN: NEGATIVE
Unit division: 0
Unit division: 0

## 2017-12-01 LAB — BPAM RBC
BLOOD PRODUCT EXPIRATION DATE: 201911182359
BLOOD PRODUCT EXPIRATION DATE: 201911182359
ISSUE DATE / TIME: 201910221007
ISSUE DATE / TIME: 201910221219
UNIT TYPE AND RH: 6200
UNIT TYPE AND RH: 6200

## 2017-12-04 ENCOUNTER — Inpatient Hospital Stay (HOSPITAL_COMMUNITY): Payer: Medicare Other | Admitting: Anesthesiology

## 2017-12-04 ENCOUNTER — Encounter (HOSPITAL_COMMUNITY)
Admission: EM | Disposition: A | Discharge status: Home / Self Care | Payer: Self-pay | Source: Home / Self Care | Attending: Internal Medicine

## 2017-12-04 ENCOUNTER — Encounter (HOSPITAL_COMMUNITY): Payer: Self-pay

## 2017-12-04 ENCOUNTER — Inpatient Hospital Stay (HOSPITAL_COMMUNITY)
Admission: EM | Admit: 2017-12-04 | Discharge: 2017-12-06 | DRG: 378 | Disposition: A | Discharge status: Home / Self Care | Payer: Medicare Other | Attending: Internal Medicine | Admitting: Internal Medicine

## 2017-12-04 ENCOUNTER — Other Ambulatory Visit: Payer: Self-pay

## 2017-12-04 DIAGNOSIS — K209 Esophagitis, unspecified: Secondary | ICD-10-CM | POA: Diagnosis not present

## 2017-12-04 DIAGNOSIS — Z79899 Other long term (current) drug therapy: Secondary | ICD-10-CM

## 2017-12-04 DIAGNOSIS — R001 Bradycardia, unspecified: Secondary | ICD-10-CM | POA: Diagnosis not present

## 2017-12-04 DIAGNOSIS — I5032 Chronic diastolic (congestive) heart failure: Secondary | ICD-10-CM | POA: Diagnosis present

## 2017-12-04 DIAGNOSIS — I780 Hereditary hemorrhagic telangiectasia: Secondary | ICD-10-CM | POA: Diagnosis present

## 2017-12-04 DIAGNOSIS — Z87891 Personal history of nicotine dependence: Secondary | ICD-10-CM

## 2017-12-04 DIAGNOSIS — E1151 Type 2 diabetes mellitus with diabetic peripheral angiopathy without gangrene: Secondary | ICD-10-CM | POA: Diagnosis present

## 2017-12-04 DIAGNOSIS — I110 Hypertensive heart disease with heart failure: Secondary | ICD-10-CM | POA: Diagnosis present

## 2017-12-04 DIAGNOSIS — D500 Iron deficiency anemia secondary to blood loss (chronic): Secondary | ICD-10-CM | POA: Diagnosis present

## 2017-12-04 DIAGNOSIS — K922 Gastrointestinal hemorrhage, unspecified: Secondary | ICD-10-CM | POA: Diagnosis present

## 2017-12-04 DIAGNOSIS — Z886 Allergy status to analgesic agent status: Secondary | ICD-10-CM

## 2017-12-04 DIAGNOSIS — E119 Type 2 diabetes mellitus without complications: Secondary | ICD-10-CM | POA: Diagnosis not present

## 2017-12-04 DIAGNOSIS — K210 Gastro-esophageal reflux disease with esophagitis: Secondary | ICD-10-CM | POA: Diagnosis present

## 2017-12-04 DIAGNOSIS — I480 Paroxysmal atrial fibrillation: Secondary | ICD-10-CM | POA: Diagnosis present

## 2017-12-04 DIAGNOSIS — Z794 Long term (current) use of insulin: Secondary | ICD-10-CM

## 2017-12-04 DIAGNOSIS — K228 Other specified diseases of esophagus: Secondary | ICD-10-CM | POA: Diagnosis not present

## 2017-12-04 DIAGNOSIS — K31811 Angiodysplasia of stomach and duodenum with bleeding: Secondary | ICD-10-CM | POA: Diagnosis present

## 2017-12-04 DIAGNOSIS — K219 Gastro-esophageal reflux disease without esophagitis: Secondary | ICD-10-CM | POA: Diagnosis not present

## 2017-12-04 DIAGNOSIS — E785 Hyperlipidemia, unspecified: Secondary | ICD-10-CM | POA: Diagnosis present

## 2017-12-04 DIAGNOSIS — I272 Pulmonary hypertension, unspecified: Secondary | ICD-10-CM | POA: Diagnosis present

## 2017-12-04 DIAGNOSIS — D649 Anemia, unspecified: Secondary | ICD-10-CM

## 2017-12-04 DIAGNOSIS — Z9889 Other specified postprocedural states: Secondary | ICD-10-CM | POA: Diagnosis not present

## 2017-12-04 DIAGNOSIS — K254 Chronic or unspecified gastric ulcer with hemorrhage: Principal | ICD-10-CM | POA: Diagnosis present

## 2017-12-04 DIAGNOSIS — K449 Diaphragmatic hernia without obstruction or gangrene: Secondary | ICD-10-CM | POA: Diagnosis present

## 2017-12-04 DIAGNOSIS — K920 Hematemesis: Secondary | ICD-10-CM | POA: Diagnosis present

## 2017-12-04 DIAGNOSIS — Z9849 Cataract extraction status, unspecified eye: Secondary | ICD-10-CM | POA: Diagnosis not present

## 2017-12-04 HISTORY — DX: Hematemesis: K92.0

## 2017-12-04 HISTORY — PX: HOT HEMOSTASIS: SHX5433

## 2017-12-04 HISTORY — PX: SUBMUCOSAL INJECTION: SHX5543

## 2017-12-04 HISTORY — PX: ESOPHAGOGASTRODUODENOSCOPY (EGD) WITH PROPOFOL: SHX5813

## 2017-12-04 LAB — COMPREHENSIVE METABOLIC PANEL
ALT: 17 U/L (ref 0–44)
AST: 24 U/L (ref 15–41)
Albumin: 2.5 g/dL — ABNORMAL LOW (ref 3.5–5.0)
Alkaline Phosphatase: 53 U/L (ref 38–126)
Anion gap: 9 (ref 5–15)
BUN: 30 mg/dL — ABNORMAL HIGH (ref 8–23)
CHLORIDE: 105 mmol/L (ref 98–111)
CO2: 25 mmol/L (ref 22–32)
Calcium: 9.3 mg/dL (ref 8.9–10.3)
Creatinine, Ser: 1.54 mg/dL — ABNORMAL HIGH (ref 0.44–1.00)
GFR, EST AFRICAN AMERICAN: 36 mL/min — AB (ref >60)
GFR, EST NON AFRICAN AMERICAN: 31 mL/min — AB (ref >60)
Glucose, Bld: 325 mg/dL — ABNORMAL HIGH (ref 70–99)
Potassium: 4 mmol/L (ref 3.5–5.1)
Sodium: 139 mmol/L (ref 135–145)
Total Bilirubin: 0.6 mg/dL (ref 0.3–1.2)
Total Protein: 4.4 g/dL — ABNORMAL LOW (ref 6.5–8.1)

## 2017-12-04 LAB — PREPARE RBC (CROSSMATCH)

## 2017-12-04 LAB — GLUCOSE, CAPILLARY
GLUCOSE-CAPILLARY: 352 mg/dL — AB (ref 70–99)
GLUCOSE-CAPILLARY: 194 mg/dL — AB (ref 70–99)

## 2017-12-04 LAB — CBC
HCT: 24.9 % — ABNORMAL LOW (ref 36.0–46.0)
Hemoglobin: 7.4 g/dL — ABNORMAL LOW (ref 12.0–15.0)
MCH: 30 pg (ref 26.0–34.0)
MCHC: 29.7 g/dL — ABNORMAL LOW (ref 30.0–36.0)
MCV: 100.8 fL — ABNORMAL HIGH (ref 80.0–100.0)
NRBC: 0 % (ref 0.0–0.2)
PLATELETS: 187 10*3/uL (ref 150–400)
RBC: 2.47 MIL/uL — ABNORMAL LOW (ref 3.87–5.11)
RDW: 17 % — AB (ref 11.5–15.5)
WBC: 13.2 10*3/uL — AB (ref 4.0–10.5)

## 2017-12-04 SURGERY — ESOPHAGOGASTRODUODENOSCOPY (EGD) WITH PROPOFOL
Anesthesia: General

## 2017-12-04 MED ORDER — SODIUM CHLORIDE 0.9% IV SOLUTION: Freq: Once | INTRAVENOUS | Status: DC

## 2017-12-04 MED ORDER — CHLORHEXIDINE GLUCONATE 0.12 % MT SOLN
15.0000 mL | Freq: Two times a day (BID) | OROMUCOSAL | Status: DC
  Administered 2017-12-05 – 2017-12-06 (×3): 15 mL via OROMUCOSAL
  Filled 2017-12-04 (×2): qty 15

## 2017-12-04 MED ORDER — INSULIN ASPART 100 UNIT/ML ~~LOC~~ SOLN
SUBCUTANEOUS | Status: DC | PRN
  Administered 2017-12-04: 10 [IU] via INTRAVENOUS

## 2017-12-04 MED ORDER — PANTOPRAZOLE SODIUM 40 MG PO TBEC: 40.0000 mg | DELAYED_RELEASE_TABLET | Freq: Every day | ORAL | Status: DC

## 2017-12-04 MED ORDER — SODIUM CHLORIDE 0.9 % IV BOLUS: 1000.0000 mL | Freq: Once | INTRAVENOUS | Status: DC

## 2017-12-04 MED ORDER — PHENYLEPHRINE 40 MCG/ML (10ML) SYRINGE FOR IV PUSH (FOR BLOOD PRESSURE SUPPORT)
PREFILLED_SYRINGE | INTRAVENOUS | Status: DC | PRN
  Administered 2017-12-04: 160 ug via INTRAVENOUS
  Administered 2017-12-04: 120 ug via INTRAVENOUS
  Administered 2017-12-04: 80 ug via INTRAVENOUS

## 2017-12-04 MED ORDER — SENNOSIDES-DOCUSATE SODIUM 8.6-50 MG PO TABS: 1.0000 | ORAL_TABLET | Freq: Every evening | ORAL | Status: DC | PRN

## 2017-12-04 MED ORDER — LIDOCAINE 2% (20 MG/ML) 5 ML SYRINGE
INTRAMUSCULAR | Status: DC | PRN
  Administered 2017-12-04: 50 mg via INTRAVENOUS

## 2017-12-04 MED ORDER — INSULIN ASPART 100 UNIT/ML ~~LOC~~ SOLN
0.0000 [IU] | Freq: Three times a day (TID) | SUBCUTANEOUS | Status: DC
  Administered 2017-12-04: 8 [IU] via SUBCUTANEOUS
  Administered 2017-12-05: 5 [IU] via SUBCUTANEOUS
  Administered 2017-12-06: 8 [IU] via SUBCUTANEOUS

## 2017-12-04 MED ORDER — ORAL CARE MOUTH RINSE
15.0000 mL | Freq: Two times a day (BID) | OROMUCOSAL | Status: DC
  Administered 2017-12-05 – 2017-12-06 (×3): 15 mL via OROMUCOSAL

## 2017-12-04 MED ORDER — SODIUM CHLORIDE 0.9 % IV SOLN
INTRAVENOUS | Status: DC
  Administered 2017-12-04: 17:00:00 via INTRAVENOUS

## 2017-12-04 MED ORDER — FENTANYL CITRATE (PF) 100 MCG/2ML IJ SOLN
INTRAMUSCULAR | Status: AC
  Filled 2017-12-04: qty 2

## 2017-12-04 MED ORDER — SUCCINYLCHOLINE CHLORIDE 200 MG/10ML IV SOSY
PREFILLED_SYRINGE | INTRAVENOUS | Status: DC | PRN
  Administered 2017-12-04: 100 mg via INTRAVENOUS

## 2017-12-04 MED ORDER — INSULIN ASPART 100 UNIT/ML ~~LOC~~ SOLN
SUBCUTANEOUS | Status: AC
  Filled 2017-12-04: qty 1

## 2017-12-04 MED ORDER — SODIUM CHLORIDE 0.9 % IV SOLN
80.0000 mg | Freq: Once | INTRAVENOUS | Status: AC
  Administered 2017-12-05: 80 mg via INTRAVENOUS
  Filled 2017-12-04: qty 80

## 2017-12-04 MED ORDER — EPINEPHRINE PF 1 MG/10ML IJ SOSY
PREFILLED_SYRINGE | INTRAMUSCULAR | Status: AC
  Filled 2017-12-04: qty 10

## 2017-12-04 MED ORDER — SODIUM CHLORIDE 0.9 % IV SOLN
INTRAVENOUS | Status: DC | PRN
  Administered 2017-12-04: 50 ug/min via INTRAVENOUS

## 2017-12-04 MED ORDER — ALBUMIN HUMAN 5 % IV SOLN
INTRAVENOUS | Status: DC | PRN
  Administered 2017-12-04: 18:00:00 via INTRAVENOUS

## 2017-12-04 MED ORDER — PROPOFOL 10 MG/ML IV BOLUS
INTRAVENOUS | Status: DC | PRN
  Administered 2017-12-04: 80 mg via INTRAVENOUS

## 2017-12-04 MED ORDER — ONDANSETRON HCL 4 MG/2ML IJ SOLN
INTRAMUSCULAR | Status: DC | PRN
  Administered 2017-12-04: 4 mg via INTRAVENOUS

## 2017-12-04 MED ORDER — INSULIN GLARGINE 100 UNIT/ML ~~LOC~~ SOLN
15.0000 [IU] | Freq: Every day | SUBCUTANEOUS | Status: DC
  Administered 2017-12-04: 15 [IU] via SUBCUTANEOUS
  Filled 2017-12-04 (×3): qty 0.15

## 2017-12-04 MED ORDER — SODIUM CHLORIDE 0.9 % IJ SOLN
PREFILLED_SYRINGE | INTRAMUSCULAR | Status: DC | PRN
  Administered 2017-12-04: 6 mL

## 2017-12-04 MED ORDER — ACETAMINOPHEN 325 MG PO TABS: 650.0000 mg | ORAL_TABLET | Freq: Four times a day (QID) | ORAL | Status: DC | PRN

## 2017-12-04 MED ORDER — SODIUM CHLORIDE 0.9 % IV SOLN
8.0000 mg/h | INTRAVENOUS | Status: DC
  Administered 2017-12-05 – 2017-12-06 (×4): 8 mg/h via INTRAVENOUS
  Filled 2017-12-04 (×5): qty 80
  Filled 2017-12-04: qty 40
  Filled 2017-12-04 (×2): qty 80

## 2017-12-04 MED ORDER — FENTANYL CITRATE (PF) 250 MCG/5ML IJ SOLN
INTRAMUSCULAR | Status: DC | PRN
  Administered 2017-12-04: 50 ug via INTRAVENOUS

## 2017-12-04 MED ORDER — INSULIN ASPART 100 UNIT/ML ~~LOC~~ SOLN: 0.0000 [IU] | Freq: Every day | SUBCUTANEOUS | Status: DC

## 2017-12-04 MED ORDER — DEXTROSE 50 % IV SOLN
INTRAVENOUS | Status: AC
  Filled 2017-12-04: qty 50

## 2017-12-04 MED ORDER — ACETAMINOPHEN 650 MG RE SUPP: 650.0000 mg | Freq: Four times a day (QID) | RECTAL | Status: DC | PRN

## 2017-12-04 MED ORDER — PANTOPRAZOLE SODIUM 40 MG IV SOLR: 40.0000 mg | Freq: Two times a day (BID) | INTRAVENOUS | Status: DC

## 2017-12-04 SURGICAL SUPPLY — 15 items

## 2017-12-04 NOTE — Op Note (Signed)
[facility] Patient Name: [name] Procedure Date : 12/04/2017 MRN: [id number] Attending MD: [name] , MD Date of Birth: 03-24-1940 CSN: [id number] Age: 77 Admit Type: Emergency Department Procedure:                Upper GI endoscopy Indications:              Suspected upper gastrointestinal bleeding, Iron                            deficiency anemia, Hematemesis Providers:                [name], MD, Cleda Daub, RN, Laverda Sorenson, Technician, Imagene Riches, CRNA Referring MD:             hospital team Medicines:                Propofol per Anesthesia, Monitored Anesthesia Care Complications:            No immediate complications. Estimated Blood Loss:     Estimated blood loss was minimal. Procedure:                Pre-Anesthesia Assessment:                           - Prior to the procedure, a History and Physical                            was performed, and patient medications and                            allergies were reviewed. The patient's tolerance of                            previous anesthesia was also reviewed. The risks                            and benefits of the procedure and the sedation                            options and risks were discussed with the patient.                            All questions were answered, and informed consent                            was obtained. Prior Anticoagulants: The patient has                            taken no previous anticoagulant or antiplatelet                            agents. ASA Grade Assessment: III - A patient with  severe systemic disease. After reviewing the risks                            and benefits, the patient was deemed in                            satisfactory condition to undergo the procedure.                           After obtaining informed consent, the endoscope was                            passed  under direct vision. Throughout the                            procedure, the patient's blood pressure, pulse, and                            oxygen saturations were monitored continuously. The                            GIF-H190 ([id number]) Olympus Adult EGD was introduced                            through the mouth, and advanced to the second part                            of duodenum. The upper GI endoscopy was                            accomplished without difficulty. The patient                            tolerated the procedure well. Scope In: Scope Out: Findings:      LA Grade C (one or more mucosal breaks continuous between tops of 2 or       more mucosal folds, less than 75% circumference) esophagitis with       bleeding was found at the gastroesophageal junction.      The exam of the esophagus was otherwise normal.      The Z-line was found 35 cm from the incisors.      One oozing superficial gastric ulcer with pigmented material was found       on the greater curvature of the stomach. The lesion was 4 mm in largest       dimension. Area was successfully injected with 6 mL of a 1:10,000       solution of epinephrine for hemostasis. For hemostasis, one hemostatic       clip was successfully placed. Due to the sharp angle, one of the       hemostatic clips was deployed but was unsuccessful in placement. There       was no bleeding at the end of the procedure.      A single 2 mm bleeding angiodysplastic lesion was found on the lesser       curvature of the stomach. Fulguration to ablate  the lesion by argon       plasma was successful. Estimated blood loss was minimal.      The examined duodenum was normal.      Localized minimal inflammation characterized by congestion (edema) and       erythema was found in the prepyloric region of the stomach.      A small hiatal hernia was present. Impression:               - LA Grade C reflux esophagitis.                           - Z-line,  35 cm from the incisors.                           - Oozing gastric ulcer with pigmented material.                            Injected. Clip was placed.                           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 Treated with argon plasma coagulation                            (APC).                           - Normal examined duodenum.                           - Acute gastritis.                           - Small hiatal hernia.                           - No specimens collected. Recommendation:           - Patient has a contact number available for                            emergencies. The signs and symptoms of potential                            delayed complications were discussed with the                            patient. Return to normal activities tomorrow.                            Written discharge instructions were provided to the                            patient.                           -  NPO.                           - Observe patient's clinical course.                           - Give Protonix (pantoprazole): initiate therapy                            with 80 mg IV bolus, then 8 mg/hr IV by continuous                            infusion. Procedure Code(s):        --- Professional ---                           [phone], Esophagogastroduodenoscopy, flexible,                            transoral; with control of bleeding, any method Diagnosis Code(s):        --- Professional ---                           K92.0, Hematemesis                           K31.811, Angiodysplasia of stomach and duodenum                            with bleeding                           K25.4, Chronic or unspecified gastric ulcer with                            hemorrhage                           K21.0, Gastro-esophageal reflux disease with                            esophagitis                           K29.00, Acute gastritis without  bleeding                           D50.9, Iron deficiency anemia, unspecified                           K44.9, Diaphragmatic hernia without obstruction or                            gangrene CPT copyright 2018 [employer]. All rights reserved. The codes documented in this report are preliminary and upon coder review may  be revised to meet current compliance requirements. [name], MD 12/04/2017 6:18:31 PM This report has been signed electronically. Number of Addenda: 0

## 2017-12-04 NOTE — ED Provider Notes (Signed)
[facility] EMERGENCY DEPARTMENT Provider Note   CSN: [id number] Arrival date & time: 12/04/17  1204     History   Chief Complaint Chief Complaint  Patient presents with  . Hematemesis    HPI [name] is a 77 y.o. female.  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  She requires frequent blood transfusions, she is followed by hematology, she attends the internal medicine clinic here at the hospital for a family doctor and is unaware of who her gastroenterologist is.  She reports she was recently transfused within the last 4 days, she started having vomiting this morning and has had at least 3 episodes of large amounts of blood.  She felt weak and nauseated but at this time feels back to her normal self.  She has no abdominal pain, no fever, no headache or blurred vision, she does get lightheaded when she stands.  Review of the medical record shows that the patient has had multiple evaluations at the hospital, by hematology, multiple blood transfusions and is monitored weekly for her hemoglobin levels.  She does report that she has frequent dark-colored stools  Past Medical History:  Diagnosis Date  . Chronic anemia   . Chronic diastolic CHF (congestive heart failure) ([place]) 10/03/2013  . Chronic GI bleeding    [name] 11/29/2014  . Family history of anesthesia complication    "niece has a hard time coming out" (09/15/2012)  . Frequent nosebleeds    chronic  . Gastric AV malformation    [name] 11/29/2014  . GERD (gastroesophageal reflux disease)   . 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 History of blood transfusion "several"  . History of epistaxis   . HTN (hypertension), benign 03/02/2012  . Hyperlipidemia   . Iron deficiency anemia    chronic infusions"  . Lichen planus    Both  lower extremities  . 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 Overgrown toenails 03/18/2017  . Pneumonia 1990's X 2  . Pulmonary HTN ([place]) 04/01/2017   PASP 49mHg on echo 09/2016  . Seizures ([facility] 09/2014  . Symptomatic anemia 11/29/2014  . Telangiectasia    Gastric   . 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    Patient Active Problem List   Diagnosis Date Noted  . Pain localized to upper abdomen 10/05/2017  . Lower extremity pain, bilateral 07/15/2017  . Pulmonary HTN ([facility] 04/01/2017  . Heart murmur 04/01/2017  . Osteopenia after menopause 01/13/2017  . Epistaxis 07/30/2016  . Bilateral lower extremity edema 04/27/2016  . Anemia due to chronic blood loss   . Dry skin 02/17/2016  . Peripheral vascular disease ([place] 12/30/2015  . Pain in joint of right shoulder 10/22/2015  . PAF (paroxysmal atrial fibrillation) ([facility] [place] 09/29/2015  . Lichen planus 078/93/8101 . Healthcare maintenance 06/20/2015  . Seborrheic keratosis 06/20/2015  . Memory loss 01/30/2015  . Simple febrile convulsions ([facility] 10/21/2014  . Hyperammonemia ([facility] 10/03/2014  . CKD (chronic kidney disease), stage II   . Chronic diastolic CHF (congestive heart failure) ([facility] 10/03/2013  . DM (diabetes mellitus), type 2, uncontrolled, with renal complications ([place] 075/11/2583 . HTN (hypertension), benign 03/02/2012  . 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 HHT (hereditary hemorrhagic telangiectasia) ([place] 02/01/2011    Past Surgical History:  Procedure Laterality  Date  . CATARACT EXTRACTION     "I think it was just one eye"  . ESOPHAGOGASTRODUODENOSCOPY  02/26/2011   Procedure: ESOPHAGOGASTRODUODENOSCOPY (EGD);  Surgeon: [name], MD;  Location: [name];  Service: Endoscopy;  Laterality: N/A;  . ESOPHAGOGASTRODUODENOSCOPY N/A 11/08/2012   Procedure: ESOPHAGOGASTRODUODENOSCOPY (EGD);  Surgeon: [name], MD;  Location: [name];  Service: Endoscopy;  Laterality: N/A;  .  ESOPHAGOGASTRODUODENOSCOPY N/A 10/04/2013   Procedure: ESOPHAGOGASTRODUODENOSCOPY (EGD);  Surgeon: Winfield Cunas., MD;  Location: [name];  Service: Endoscopy;  Laterality: N/A;  with APC on stand-by  . ESOPHAGOGASTRODUODENOSCOPY N/A 07/06/2014   Procedure: ESOPHAGOGASTRODUODENOSCOPY (EGD);  Surgeon: Clarene Essex, MD;  Location: [name];  Service: Endoscopy;  Laterality: N/A;  . ESOPHAGOGASTRODUODENOSCOPY N/A 09/05/2014   Procedure: ESOPHAGOGASTRODUODENOSCOPY (EGD);  Surgeon: Laurence Spates, MD;  Location: [name];  Service: Endoscopy;  Laterality: N/A;  APC on standby to control bleeding  . ESOPHAGOGASTRODUODENOSCOPY N/A 11/29/2014   Procedure: ESOPHAGOGASTRODUODENOSCOPY (EGD);  Surgeon: [name], MD;  Location: [facility] ENDOSCOPY;  Service: Endoscopy;  Laterality: N/A;  . ESOPHAGOGASTRODUODENOSCOPY N/A 09/28/2015   Procedure: ESOPHAGOGASTRODUODENOSCOPY (EGD);  Surgeon: Clarene Essex, MD;  Location: [facility] ENDOSCOPY;  Service: Endoscopy;  Laterality: N/A;  . ESOPHAGOGASTRODUODENOSCOPY ENDOSCOPY  08/19/2006   with laser treatment  . HOT HEMOSTASIS N/A 07/06/2014   Procedure: HOT HEMOSTASIS (ARGON PLASMA COAGULATION/BICAP);  Surgeon: Clarene Essex, MD;  Location: [name];  Service: Endoscopy;  Laterality: N/A;  . HOT HEMOSTASIS N/A 09/28/2015   Procedure: HOT HEMOSTASIS (ARGON PLASMA COAGULATION/BICAP);  Surgeon: Clarene Essex, MD;  Location: [facility] ENDOSCOPY;  Service: Endoscopy;  Laterality: N/A;  . NASAL HEMORRHAGE CONTROL     "for bleeding"   . SAVORY DILATION  02/26/2011   Procedure: SAVORY DILATION;  Surgeon: [name], MD;  Location: [name];  Service: Endoscopy;  Laterality: N/A;  c-arm needed     OB History   None      Home Medications    Prior to Admission medications   Medication Sig Start Date End Date Taking? Authorizing Provider  b complex vitamins capsule Take 1 capsule by mouth daily. 10/26/16   Alphonzo Grieve, MD  Blood Glucose Monitoring Suppl (ONETOUCH  VERIO) w/Device KIT  07/07/16   [provider]  calcium citrate-vitamin D (CITRACAL+D) 315-200 MG-UNIT tablet Take 1 tablet by mouth 2 (two) times daily. 12/31/15 04/01/17  Ledell Noss, MD  cetaphil (CETAPHIL) lotion Apply 1 application topically as needed for dry skin. 02/18/16   Rice, Resa Miner, MD  donepezil (ARICEPT) 10 MG tablet Take 1 tablet (10 mg total) by mouth at bedtime. 11/10/17   Ward Givens, NP  Elastic Bandages & Supports ([place]) MISC Wear as much as possible while awake to reduce swelling 04/27/16   Minus Liberty, MD  FERROCITE 324 MG TABS tablet TAKE 1 TABLET BY MOUTH TWICE DAILY 09/27/17   Bartholomew Crews, MD  furosemide (LASIX) 20 MG tablet TAKE 1 TABLET BY MOUTH ONCE DAILY 05/19/17   Lyda Jester M, PA-C  glucose blood (ONETOUCH VERIO) test strip Use as instructed 3-4 times daily. E11.29, insulin requiring 03/18/17   [name], DO  glucose blood [facility] VERIO) test strip Check blood sugar 4 times a day 09/20/17   Bartholomew Crews, MD  insulin degludec (TRESIBA FLEXTOUCH) 100 UNIT/ML SOPN FlexTouch Pen Inject 0.22 mLs (22 Units total) into the skin every morning. 11/18/17   Bloomfield, Carley D, DO  Insulin Pen Needle (B-D UF III MINI PEN NEEDLES) 31G X  5 MM MISC USE TO INJECT INSULIN 3 TIMES DAILY 07/06/17   [name], DO  lactulose (CHRONULAC) 10 GM/15ML solution TAKE 15 ML BY MOUTH TWO TIMES DAILY 11/18/17   Bloomfield, Carley D, DO  levETIRAcetam (KEPPRA) 500 MG tablet Take 1 tablet (500 mg total) by mouth 2 (two) times daily. 11/10/17   Ward Givens, NP  memantine (NAMENDA) 10 MG tablet Take 1 tablet (10 mg total) by mouth 2 (two) times daily. 11/10/17   Ward Givens, NP  [facility] DELICA LANCETS FINE MISC Check blood sugar up to 4 times a day 09/20/17   Bartholomew Crews, MD  oxymetazoline (AFRIN) 0.05 % nasal spray Place 1 spray into both nostrils 2 (two) times daily as needed (Epistaxis). Patient not  taking: Reported on 11/10/2017 10/05/13   Robbie Lis, MD  pantoprazole (PROTONIX) 40 MG tablet Take 1 tablet (40 mg total) by mouth 2 (two) times daily. 07/07/14   Hosie Poisson, MD  potassium chloride (K-DUR) 10 MEQ tablet Take 1 tablet (10 mEq total) by mouth daily. 04/12/17   [name], MD  potassium chloride SA (KLOR-CON M20) 20 MEQ tablet TAKE 1 TABLET BY MOUTH ONCE DAILY 09/17/16   Lyda Jester M, PA-C  pramipexole (MIRAPEX) 0.125 MG tablet TAKE 1 TABLET BY MOUTH ONCE DAILY 08/20/17   Aldine Contes, MD  Semaglutide,0.25 or 0.5MG/DOS, (OZEMPIC, 0.25 OR 0.5 MG/DOSE,) 2 MG/1.5ML SOPN Inject 0.5 mg into the skin once a week. 11/01/17   Bloomfield, Carley D, DO  sodium chloride (OCEAN) 0.65 % SOLN nasal spray Place 1 spray into both nostrils as needed for congestion. 07/30/16   [name], DO  triamcinolone ointment (KENALOG) 0.5 % APPLY 1 APPLICATION TOPICALLY TO RASH TWICE DAILY FOR ITCHING 08/17/17   Axel Filler, MD    Family History Family History  Problem Relation Age of Onset  . Stroke Father   . Breast cancer Unknown   . Malignant hyperthermia Neg Hx   . Seizures Neg Hx     Social History Social History   Tobacco Use  . Smoking status: Former Smoker    Packs/day: 1.00    Years: 20.00    Pack years: 20.00    Types: Cigarettes    Last attempt to quit: 02/10/1971    Years since quitting: 46.8  . Smokeless tobacco: Never Used  . Tobacco comment: 09/15/2012 "smoked 50-60 yr ago"  Substance Use Topics  . Alcohol use: No    Alcohol/week: 0.0 standard drinks  . Drug use: No     Allergies   Aspirin   Review of Systems Review of Systems  All other systems reviewed and are negative.    Physical Exam Updated Vital Signs BP (!) 103/35   Pulse 67   Temp 98 F (36.7 C) (Oral)   Resp 18   LMP  (LMP Unknown)   SpO2 99%   Physical Exam  Constitutional: She appears well-developed and well-nourished. No distress.  HENT:  Head: Normocephalic and  atraumatic.  Mouth/Throat: Oropharynx is clear and moist. No oropharyngeal exudate.  Pale mucous membranes  Eyes: Pupils are equal, round, and reactive to light. EOM are normal. Right eye exhibits no discharge. Left eye exhibits no discharge. No scleral icterus.  Pale conjunctive a  Neck: Normal range of motion. Neck supple. No JVD present. No thyromegaly present.  Cardiovascular: Normal rate, regular rhythm and intact distal pulses. Exam reveals no gallop and no friction rub.  Murmur ( Systolic murmur) heard. Pulmonary/Chest: Effort normal and  breath sounds normal. No respiratory distress. She has no wheezes. She has no rales.  Abdominal: Soft. Bowel sounds are normal. She exhibits no distension and no mass. There is no tenderness.  Musculoskeletal: Normal range of motion. She exhibits no edema or tenderness.  Lymphadenopathy:    She has no cervical adenopathy.  Neurological: She is alert. Coordination normal.  Generally weak but able to perform all commands  Skin: Skin is warm and dry. No rash noted. No erythema.  Psychiatric: She has a normal mood and affect. Her behavior is normal.  Nursing note and vitals reviewed.    ED Treatments / Results  Labs (all labs ordered are listed, but only abnormal results are displayed) Labs Reviewed  COMPREHENSIVE METABOLIC PANEL - Abnormal; Notable for the following components:      Result Value   Glucose, Bld 325 (*)    BUN 30 (*)    Creatinine, Ser 1.54 (*)    Total Protein 4.4 (*)    Albumin 2.5 (*)    GFR calc non Af Amer 31 (*)    GFR calc Af Amer 36 (*)    All other components within normal limits  CBC - Abnormal; Notable for the following components:   WBC 13.2 (*)    RBC 2.47 (*)    Hemoglobin 7.4 (*)    HCT 24.9 (*)    MCV 100.8 (*)    MCHC 29.7 (*)    RDW 17.0 (*)    All other components within normal limits  TYPE AND SCREEN  PREPARE RBC (CROSSMATCH)    EKG None  Radiology No results found.  Procedures .Critical  Care Performed by: Noemi Chapel, MD Authorized by: Noemi Chapel, MD   Critical care provider statement:    Critical care time (minutes):  35   Critical care time was exclusive of:  Separately billable procedures and treating other patients and teaching time   Critical care was necessary to treat or prevent imminent or life-threatening deterioration of the following conditions: acute upper GI bleed and severe anemia.   Critical care was time spent personally by me on the following activities:  Blood draw for specimens, development of treatment plan with patient or surrogate, discussions with consultants, evaluation of patient's response to treatment, examination of patient, obtaining history from patient or surrogate, ordering and performing treatments and interventions, ordering and review of laboratory studies, ordering and review of radiographic studies, pulse oximetry, re-evaluation of patient's condition and review of old charts   (including critical care time)  Medications Ordered in ED Medications  sodium chloride 0.9 % bolus 1,000 mL (has no administration in time range)  0.9 %  sodium chloride infusion (Manually program via Guardrails IV Fluids) (has no administration in time range)  0.9 %  sodium chloride infusion (Manually program via Guardrails IV Fluids) (has no administration in time range)     Initial Impression / Assessment and Plan / ED Course  I have reviewed the triage vital signs and the nursing notes.  Pertinent labs & imaging results that were available during my care of the patient were reviewed by me and considered in my medical decision making (see chart for details).    The patient has what appears to be worsening anemia, she is severely weak and pale and after multiple episodes of hematemesis this morning likely is more anemic than her hemoglobin results suggest.  Her hemoglobin is low at 7.5, comparing other old labs, see following.  At this time the patient  would benefit from being admitted to the hospital, she will need blood transfusions, may need GI consultation.  Another review of the medical record shows that Dr. Watt Climes / Michail Sermon had performed prior endoscopy  Last endoscopy was in August 2017  Hemoglobin  Date Value Ref Range Status  12/04/2017 7.4 (L) 12.0 - 15.0 g/dL Final  11/29/2017 7.9 (L) 12.0 - 15.0 g/dL Final  11/22/2017 8.7 (L) 12.0 - 15.0 g/dL Final  11/15/2017 9.7 (L) 12.0 - 15.0 g/dL Final   HGB  Date Value Ref Range Status  03/13/2015 9.7 (L) 11.6 - 15.9 g/dL Final  02/28/2015 7.9 (L) 11.6 - 15.9 g/dL Final  02/06/2015 7.4 (L) 11.6 - 15.9 g/dL Final  01/09/2015 8.3 (L) 11.6 - 15.9 g/dL Final   Care was discussed with the internal medicine resident who will admit, with Dr. Michail Sermon who will see the patient in consultation.  The patient is critically ill with an upper GI bleed and severe anemia, transfusion starting in the emergency department  Final Clinical Impressions(s) / ED Diagnoses   Final diagnoses:  Hematemesis with nausea  Acute upper GI bleed  Severe anemia      Noemi Chapel, MD 12/04/17 1436

## 2017-12-04 NOTE — ED Triage Notes (Signed)
Pt presents for evaluation of hematemesis. States she vomited blood "multiple times" this morning. Pt reports hx of blood transfusions and hematemesis. Reports she "has weak blood vessels in abdomen." Pt denies abd pain or other associated symptoms. Just received transfusion on Tuesday.

## 2017-12-04 NOTE — ED Notes (Signed)
Admitting provider at bedside for evaluation.

## 2017-12-04 NOTE — H&P (Signed)
Date: 12/04/2017               Patient Name:  [name] MRN: [id number]  DOB: 30-Jan-1941 Age / Sex: 77 y.o., female   PCP: Delice Bison, DO         Medical Service: Internal Medicine Teaching Service         Attending Physician: Dr. Bartholomew Crews, MD    First Contact: Dr. Laural Golden Pager: [phone]  Second Contact: Dr. 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  She was in her normal state of health until this morning when she was trying to have a bowel movement this morning when she suddenly had 3 episodes of hematemesis.  She denied any preceding symptoms of abdominal pain, nausea, fever or any recent sick contacts.  She said she recently had a blood transfusion 4 days ago which she tolerated well.  She said she gets blood transfusions about once a month. She denied any lightheadedness, dizziness, headaches; endorsed lower extremity weakness that has made it hard to ambulate. She denied diarrhea or constipation or blood in her stools, but has noticed dark-colored stools.  In the ED, she was normotensive, afebrile and mildly bradycardic. CBC showed a Hgb of 7.4 with plans to transfuse ~2 units and GI was consulted with plans to proceed with endoscopy.   Meds:  Current Meds  Medication Sig  . augmented betamethasone dipropionate (DIPROLENE-AF) 0.05 % cream Apply 1 application topically daily.  Marland Kitchen b complex vitamins capsule Take 1 capsule by mouth daily.  . calcium citrate-vitamin D (CITRACAL+D) 315-200 MG-UNIT tablet Take 1 tablet by mouth 2 (two) times daily.  . cetaphil  (CETAPHIL) lotion Apply 1 application topically as needed for dry skin.  Marland Kitchen donepezil (ARICEPT) 10 MG tablet Take 1 tablet (10 mg total) by mouth at bedtime.  Marland Kitchen FERROCITE 324 MG TABS tablet TAKE 1 TABLET BY MOUTH TWICE DAILY  . furosemide (LASIX) 20 MG tablet TAKE 1 TABLET BY MOUTH ONCE DAILY (Patient taking differently: Take 20 mg by mouth daily. )  . insulin degludec (TRESIBA FLEXTOUCH) 100 UNIT/ML SOPN FlexTouch Pen Inject 0.22 mLs (22 Units total) into the skin every morning.  . lactulose (CHRONULAC) 10 GM/15ML solution TAKE 15 ML BY MOUTH TWO TIMES DAILY  . levETIRAcetam (KEPPRA) 500 MG tablet Take 1 tablet (500 mg total) by mouth 2 (two) times daily.  . memantine (NAMENDA) 10 MG tablet Take 1 tablet (10 mg total) by mouth 2 (two) times daily.  . pantoprazole (PROTONIX) 40 MG tablet Take 1 tablet (40 mg total) by mouth 2 (two) times daily.  . potassium chloride (K-DUR) 10 MEQ tablet Take 1 tablet (10 mEq total) by mouth daily.  . pramipexole (MIRAPEX) 0.125 MG tablet TAKE 1 TABLET BY MOUTH ONCE DAILY  . Semaglutide,0.25 or 0.5MG /DOS, (OZEMPIC, 0.25 OR 0.5 MG/DOSE,) 2 MG/1.5ML SOPN Inject 0.5 mg into the skin once a week.  . sodium chloride (OCEAN) 0.65 % SOLN nasal spray Place 1 spray into both nostrils as needed for congestion.  . triamcinolone ointment (KENALOG) 0.5 % APPLY 1 APPLICATION  TOPICALLY TO RASH TWICE DAILY FOR ITCHING (Patient taking differently: Apply 1 application topically 2 (two) times daily as needed (itching). )     Allergies: Allergies as of 12/04/2017 - Review Complete 12/04/2017  Allergen Reaction Noted  . Aspirin Nausea And Vomiting 05/12/2006   Past Medical History:  Diagnosis Date  . Chronic anemia   . Chronic diastolic CHF (congestive heart failure) ([place]) 10/03/2013  . Chronic GI bleeding    [name] 11/29/2014  . Family history of anesthesia complication    "niece has a hard time coming out" (09/15/2012)  . Frequent nosebleeds    chronic  . Gastric AV  malformation    [name] 11/29/2014  . GERD (gastroesophageal reflux disease)   . 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 History of blood transfusion "several"  . History of epistaxis   . HTN (hypertension), benign 03/02/2012  . Hyperlipidemia   . Iron deficiency anemia    chronic infusions"  . Lichen planus    Both lower extremities  . 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 Overgrown toenails 03/18/2017  . Pneumonia 1990's X 2  . Pulmonary HTN ([place]) 04/01/2017   PASP 65mmHg on echo 09/2016  . Seizures ([place]) 09/2014  . Symptomatic anemia 11/29/2014  . Telangiectasia    Gastric   . 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    Family History:  Family History  Problem Relation Age of Onset  . Stroke Father   . Breast cancer Unknown   . Malignant hyperthermia Neg Hx   . Seizures Neg Hx    Social History:  Social History   Socioeconomic History  . Marital status: Married    Spouse name: [name]  . Number of children: 0  . Years of education: 9  . Highest education level: Not on file  Occupational History  . Occupation: Retired Systems developer: RETIRED  Social Needs  . Financial resource strain: Not on file  . Food insecurity:    Worry: Not on file    Inability: Not on file  . Transportation needs:    Medical: Not on file    Non-medical: Not on file  Tobacco Use  . Smoking status: Former Smoker    Packs/day: 1.00    Years: 20.00    Pack years: 20.00    Types: Cigarettes    Last attempt to quit: 02/10/1971    Years since quitting: 46.8  . Smokeless tobacco: Never Used  . Tobacco comment: 09/15/2012 "smoked 50-60 yr ago"  Substance and Sexual Activity  . Alcohol use: No    Alcohol/week: 0.0 standard drinks  . Drug use: No  . Sexual activity: Not on file  Lifestyle  . Physical activity:    Days per week: Not on file    Minutes per session: Not on file  . Stress: Not on file  Relationships  . Social  connections:    Talks on phone: Not on file    Gets together: Not on file    Attends religious service: Not on file    Active member of club or organization: Not on file    Attends meetings of clubs or organizations: Not on file    Relationship status: Not on file  . Intimate partner violence:    Fear of current or ex partner: Not on file    Emotionally abused: Not on file    Physically abused: Not on file  Forced sexual activity: Not on file  Other Topics Concern  . Not on file  Social History Narrative   Married, lives with husband, [name].  Ambulates without assistance.     Caffeine use: none    Review of Systems: A complete ROS was negative except as per HPI.  Physical Exam: Blood pressure (!) 117/37, pulse 81, temperature (!) 97.5 F (36.4 C), resp. rate 16, height 5\' 3"  (1.6 m), weight 68.5 kg, SpO2 100 %.  Physical Exam  Constitutional: She is oriented to person, place, and time and well-developed, well-nourished, and in no distress.  Cardiovascular: Normal rate and regular rhythm.  Pulmonary/Chest: Effort normal and breath sounds normal. No respiratory distress. She has no wheezes.  Abdominal: Soft. Bowel sounds are normal. She exhibits no distension. There is no tenderness.  Musculoskeletal: She exhibits edema.  Non-pitting  Neurological: She is alert and oriented to person, place, and time.  Skin: Skin is warm and dry.    EKG: n/a   CXR: n/a  Assessment & Plan by Problem: Active Problems:   Hematemesis  Ms. 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   Hematemesis Patient had three episodes of hematemesis this morning. She had no preceding symptoms, no abdominal pain, nausea, or fevers. Hgb 7.4. She was transfused a couple days ago. Hemodynamically stable in the ED  and afebrile. 2 units of PRBC's were ordered. GI plans to do an endoscopy today.  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  SignedMike Craze, DO 12/04/2017, 6:44 PM  Pager: [phone]

## 2017-12-04 NOTE — Interval H&P Note (Signed)
History and Physical Interval Note:  12/04/2017 5:13 PM  [name]  has presented today for surgery, with the diagnosis of Hematemesis  The various methods of treatment have been discussed with the patient and family. After consideration of risks, benefits and other options for treatment, the patient has consented to  Procedure(s): ESOPHAGOGASTRODUODENOSCOPY (EGD) WITH PROPOFOL (N/A) as a surgical intervention .  The patient's history has been reviewed, patient examined, no change in status, stable for surgery.  I have reviewed the patient's chart and labs.  Questions were answered to the patient's satisfaction.     [name]

## 2017-12-04 NOTE — Anesthesia Preprocedure Evaluation (Addendum)
Anesthesia Evaluation  Patient identified by MRN, date of birth, ID band Patient awake    Reviewed: Allergy & Precautions, NPO status , Patient's Chart, lab work & pertinent test results  Airway Mallampati: II  TM Distance: >3 FB Neck ROM: Full    Dental  (+) Dental Advisory Given, Missing,    Pulmonary former smoker,    breath sounds clear to auscultation       Cardiovascular hypertension, + Peripheral Vascular Disease and +CHF   Rhythm:Regular Rate:Normal     Neuro/Psych Seizures -,     GI/Hepatic Neg liver ROS, GERD  Medicated,  Endo/Other  diabetes, Type 2, Insulin Dependent, Oral Hypoglycemic Agents  Renal/GU Renal disease     Musculoskeletal   Abdominal Normal abdominal exam  (+)   Peds  Hematology   Anesthesia Other Findings - HLD - Osler-Weber-Rendu  Reproductive/Obstetrics                            Lab Results  Component Value Date   WBC 13.2 (H) 12/04/2017   HGB 7.4 (L) 12/04/2017   HCT 24.9 (L) 12/04/2017   MCV 100.8 (H) 12/04/2017   PLT 187 12/04/2017   Echo: - Left ventricle: The cavity size was normal. Wall thickness was   increased in a pattern of moderate LVH. Systolic function was   vigorous. The estimated ejection fraction was in the range of 65%   to 70%. Wall motion was normal; there were no regional wall   motion abnormalities. Features are consistent with a pseudonormal   left ventricular filling pattern, with concomitant abnormal   relaxation and increased filling pressure (grade 2 diastolic   dysfunction). - Aortic valve: Valve area (VTI): 1.71 cm^2. Valve area (Vmax):   1.38 cm^2. Valve area (Vmean): 1.5 cm^2. - Left atrium: The atrium was severely dilated. - Right atrium: The atrium was mildly dilated. - Pulmonary arteries: Systolic pressure was moderately increased.   PA peak pressure: 45 mm Hg (S).    Anesthesia Physical Anesthesia Plan  ASA:  III  Anesthesia Plan: General   Post-op Pain Management:    Induction: Intravenous, Rapid sequence and Cricoid pressure planned  PONV Risk Score and Plan: 2 and Ondansetron and Treatment may vary due to age or medical condition  Airway Management Planned: Oral ETT  Additional Equipment: None  Intra-op Plan:   Post-operative Plan: Extubation in OR  Informed Consent: I have reviewed the patients History and Physical, chart, labs and discussed the procedure including the risks, benefits and alternatives for the proposed anesthesia with the patient or authorized representative who has indicated his/her understanding and acceptance.   Dental advisory given  Plan Discussed with: CRNA  Anesthesia Plan Comments:        Anesthesia Quick Evaluation

## 2017-12-04 NOTE — Anesthesia Postprocedure Evaluation (Signed)
Anesthesia Post Note  Patient: [name]  Procedure(s) Performed: ESOPHAGOGASTRODUODENOSCOPY (EGD) WITH PROPOFOL (N/A ) SUBMUCOSAL INJECTION HEMOSTASIS CONTROL HOT HEMOSTASIS (ARGON PLASMA COAGULATION/BICAP) (N/A )     Patient location during evaluation: PACU Anesthesia Type: General Level of consciousness: awake and alert Pain management: pain level controlled Vital Signs Assessment: post-procedure vital signs reviewed and stable Respiratory status: spontaneous breathing, nonlabored ventilation, respiratory function stable and patient connected to nasal cannula oxygen Cardiovascular status: blood pressure returned to baseline and stable Postop Assessment: no apparent nausea or vomiting Anesthetic complications: no    Last Vitals:  Vitals:   12/04/17 1806 12/04/17 1815  BP: (!) 121/43 (!) 117/37  Pulse: 76 81  Resp: 15 16  Temp:    SpO2: 100% 100%    Last Pain:  Vitals:   12/04/17 1800  TempSrc:   PainSc: 0-No pain                 Effie Berkshire

## 2017-12-04 NOTE — Anesthesia Procedure Notes (Signed)
Procedure Name: Intubation Date/Time: 12/04/2017 5:18 PM Performed by: Imagene Riches, CRNA Pre-anesthesia Checklist: Patient identified, Emergency Drugs available, Suction available and Patient being monitored Patient Re-evaluated:Patient Re-evaluated prior to induction Oxygen Delivery Method: Circle System Utilized Preoxygenation: Pre-oxygenation with 100% oxygen Induction Type: IV induction, Rapid sequence and Cricoid Pressure applied Laryngoscope Size: Glidescope and 3 Grade View: Grade I Tube type: Oral Tube size: 7.0 mm Number of attempts: 1 Airway Equipment and Method: Stylet and Oral airway Placement Confirmation: ETT inserted through vocal cords under direct vision,  positive ETCO2 and breath sounds checked- equal and bilateral Secured at: 20 cm Tube secured with: Tape Dental Injury: Teeth and Oropharynx as per pre-operative assessment  Comments: Elective glide scope Go intubation

## 2017-12-04 NOTE — Transfer of Care (Signed)
Immediate Anesthesia Transfer of Care Note  Patient: [name]  Procedure(s) Performed: ESOPHAGOGASTRODUODENOSCOPY (EGD) WITH PROPOFOL (N/A ) SUBMUCOSAL INJECTION HEMOSTASIS CONTROL HOT HEMOSTASIS (ARGON PLASMA COAGULATION/BICAP) (N/A )  Patient Location: PACU  Anesthesia Type:General  Level of Consciousness: drowsy  Airway & Oxygen Therapy: Patient Spontanous Breathing and Patient connected to nasal cannula oxygen  Post-op Assessment: Report given to RN and Post -op Vital signs reviewed and stable  Post vital signs: Reviewed and stable  Last Vitals:  Vitals Value Taken Time  BP    Temp    Pulse 77 12/04/2017  6:03 PM  Resp 16 12/04/2017  6:03 PM  SpO2 100 % 12/04/2017  6:03 PM  Vitals shown include unvalidated device data.  Last Pain:  Vitals:   12/04/17 1629  TempSrc: Oral  PainSc: 0-No pain         Complications: No apparent anesthesia complications

## 2017-12-04 NOTE — Consult Note (Signed)
Referring Provider: Dr. Sabra Heck Primary Care Physician:  Delice Bison, DO Primary Gastroenterologist:  Dr. Watt Climes  Reason for Consultation:  GI bleed  HPI: [name] is a 77 y.o. female multiple medical problems and history of AVMs with chronic anemia. 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 Has chronic black stools on iron pills. Denies hematochezia. Denies abdominal pain. Felt weak and nauseous. Dizziness with standing. S/P 2 U PRBCs 4 days ago with Hgb 7.9 and now it is 7.4 (8.7 in 11/22/17). History of GERD on Protonix BID at home.  Past Medical History:  Diagnosis Date  . Chronic anemia   . Chronic diastolic CHF (congestive heart failure) ([place]) 10/03/2013  . Chronic GI bleeding    [name] 11/29/2014  . Family history of anesthesia complication    "niece has a hard time coming out" (09/15/2012)  . Frequent nosebleeds    chronic  . Gastric AV malformation    [name] 11/29/2014  . GERD (gastroesophageal reflux disease)   . 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 History of blood transfusion "several"  . History of epistaxis   . HTN (hypertension), benign 03/02/2012  . Hyperlipidemia   . Iron deficiency anemia    chronic infusions"  . Lichen planus    Both lower extremities  . 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 Overgrown toenails 03/18/2017  . Pneumonia 1990's X 2  . Pulmonary HTN ([place]) 04/01/2017   PASP 19mHg on echo 09/2016  [place]  . Symptomatic anemia 11/29/2014  . Telangiectasia    Gastric   . 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    Past Surgical History:  Procedure Laterality Date  . CATARACT EXTRACTION     "I think it was just one eye"  . ESOPHAGOGASTRODUODENOSCOPY  02/26/2011   Procedure:  ESOPHAGOGASTRODUODENOSCOPY (EGD);  Surgeon: JMissy Sabins MD;  [facility];  Service: Endoscopy;  Laterality: N/A;  . ESOPHAGOGASTRODUODENOSCOPY N/A 11/08/2012   Procedure: ESOPHAGOGASTRODUODENOSCOPY (EGD);  Surgeon: PBeryle Beams MD;  [facility];  Service: Endoscopy;  Laterality: N/A;  . ESOPHAGOGASTRODUODENOSCOPY N/A 10/04/2013   Procedure: ESOPHAGOGASTRODUODENOSCOPY (EGD);  Surgeon: JWinfield Cunas, MD;  [facility];  Service: Endoscopy;  Laterality: N/A;  with APC on stand-by  . ESOPHAGOGASTRODUODENOSCOPY N/A 07/06/2014   Procedure: ESOPHAGOGASTRODUODENOSCOPY (EGD);  Surgeon: MClarene Essex MD;  [facility];  Service: Endoscopy;  Laterality: N/A;  . ESOPHAGOGASTRODUODENOSCOPY N/A 09/05/2014   Procedure: ESOPHAGOGASTRODUODENOSCOPY (EGD);  Surgeon: JLaurence Spates MD;  [facility];  Service: Endoscopy;  Laterality: N/A;  APC on standby to control bleeding  . ESOPHAGOGASTRODUODENOSCOPY N/A 11/29/2014   Procedure: ESOPHAGOGASTRODUODENOSCOPY (EGD);  Surgeon: VWilford Corner MD;  [facility]  Service: Endoscopy;  Laterality: N/A;  . ESOPHAGOGASTRODUODENOSCOPY N/A 09/28/2015   Procedure: ESOPHAGOGASTRODUODENOSCOPY (EGD);  Surgeon: MClarene Essex MD;  [facility];  Service: Endoscopy;  Laterality: N/A;  . ESOPHAGOGASTRODUODENOSCOPY ENDOSCOPY  08/19/2006   with laser treatment  . HOT HEMOSTASIS N/A 07/06/2014   Procedure: HOT HEMOSTASIS (ARGON PLASMA COAGULATION/BICAP);  Surgeon: MClarene Essex MD;  [facility];  Service: Endoscopy;  Laterality: N/A;  . HOT HEMOSTASIS N/A 09/28/2015   Procedure: HOT HEMOSTASIS (ARGON PLASMA COAGULATION/BICAP);  Surgeon: MClarene Essex MD;  [facility];  Service: Endoscopy;  Laterality: N/A;  . NASAL HEMORRHAGE CONTROL     "for bleeding"   . SAVORY DILATION  02/26/2011   Procedure: SAVORY DILATION;  Surgeon: [name] C Hayes, MD;  [name];  Service: Endoscopy;   Laterality: N/A;  c-arm needed    Prior to Admission medications   Medication Sig Start Date End Date Taking? Authorizing Provider  augmented betamethasone dipropionate (DIPROLENE-AF) 0.05 % cream Apply 1 application topically daily. 11/16/17  Yes [provider]  b complex vitamins capsule Take 1 capsule by mouth daily. 10/26/16  Yes Svalina, Gorica, MD  calcium citrate-vitamin D (CITRACAL+D) 315-200 MG-UNIT tablet Take 1 tablet by mouth 2 (two) times daily. 12/31/15 12/04/17 Yes Blum, Nina, MD  cetaphil (CETAPHIL) lotion Apply 1 application topically as needed for dry skin. 02/18/16  Yes Rice, Christopher W, MD  donepezil (ARICEPT) 10 MG tablet Take 1 tablet (10 mg total) by mouth at bedtime. 11/10/17  Yes Millikan, Megan, NP  FERROCITE 324 MG TABS tablet TAKE 1 TABLET BY MOUTH TWICE DAILY 09/27/17  Yes Butcher, Elizabeth A, MD  furosemide (LASIX) 20 MG tablet TAKE 1 TABLET BY MOUTH ONCE DAILY Patient taking differently: Take 20 mg by mouth daily.  05/19/17  Yes Simmons, Brittainy M, PA-C  insulin degludec (TRESIBA FLEXTOUCH) 100 UNIT/ML SOPN FlexTouch Pen Inject 0.22 mLs (22 Units total) into the skin every morning. 11/18/17  Yes Bloomfield, Carley D, DO  lactulose (CHRONULAC) 10 GM/15ML solution TAKE 15 ML BY MOUTH TWO TIMES DAILY 11/18/17  Yes Bloomfield, Carley D, DO  levETIRAcetam (KEPPRA) 500 MG tablet Take 1 tablet (500 mg total) by mouth 2 (two) times daily. 11/10/17  Yes Millikan, Megan, NP  memantine (NAMENDA) 10 MG tablet Take 1 tablet (10 mg total) by mouth 2 (two) times daily. 11/10/17  Yes Millikan, Megan, NP  pantoprazole (PROTONIX) 40 MG tablet Take 1 tablet (40 mg total) by mouth 2 (two) times daily. 07/07/14  Yes Akula, Vijaya, MD  potassium chloride (K-DUR) 10 MEQ tablet Take 1 tablet (10 mEq total) by mouth daily. 04/12/17  Yes Turner, Traci R, MD  pramipexole (MIRAPEX) 0.125 MG tablet TAKE 1 TABLET BY MOUTH ONCE DAILY 08/20/17  Yes Narendra, Nischal, MD  Semaglutide,0.25 or  0.5MG/DOS, (OZEMPIC, 0.25 OR 0.5 MG/DOSE,) 2 MG/1.5ML SOPN Inject 0.5 mg into the skin once a week. 11/01/17  Yes Bloomfield, Carley D, DO  sodium chloride (OCEAN) 0.65 % SOLN nasal spray Place 1 spray into both nostrils as needed for congestion. 07/30/16  Yes Wallace, Andrew, DO  triamcinolone ointment (KENALOG) 0.5 % APPLY 1 APPLICATION TOPICALLY TO RASH TWICE DAILY FOR ITCHING Patient taking differently: Apply 1 application topically 2 (two) times daily as needed (itching).  08/17/17  Yes Calahan Pak, Duncan Thomas, MD  Blood Glucose Monitoring Suppl (ONETOUCH VERIO) w/Device KIT  07/07/16   [provider]  Elastic Bandages & Supports (MEDICAL COMPRESSION STOCKINGS) MISC Wear as much as possible while awake to reduce swelling 04/27/16   O'Sullivan, Matthew, MD  glucose blood (ONETOUCH VERIO) test strip Use as instructed 3-4 times daily. E11.29, insulin requiring 03/18/17   Wallace, Andrew, DO  glucose blood (ONETOUCH VERIO) test strip Check blood sugar 4 times a day 09/20/17   Butcher, Elizabeth A, MD  Insulin Pen Needle (B-D UF III MINI PEN NEEDLES) 31G X 5 MM MISC USE TO INJECT INSULIN 3 TIMES DAILY 07/06/17   Wallace, Andrew, DO  ONETOUCH DELICA LANCETS FINE MISC Check blood sugar up to 4 times a day 09/20/17     Butcher, Elizabeth A, MD  oxymetazoline (AFRIN) 0.05 % nasal spray Place 1 spray into both nostrils 2 (two) times daily as needed (Epistaxis). Patient not taking: Reported on 11/10/2017 10/05/13   Devine, Alma M, MD  potassium chloride SA (KLOR-CON M20) 20 MEQ tablet TAKE 1 TABLET BY MOUTH ONCE DAILY Patient not taking: Reported on 12/04/2017 09/17/16   Simmons, Brittainy M, PA-C    Scheduled Meds: . [MAR Hold] sodium chloride   Intravenous Once  . [MAR Hold] sodium chloride   Intravenous Once  . insulin aspart      . [MAR Hold] pantoprazole  40 mg Oral Daily   Continuous Infusions: . sodium chloride    . [MAR Hold] sodium chloride     PRN Meds:.[MAR Hold] acetaminophen **OR** [MAR  Hold] acetaminophen, [MAR Hold] senna-docusate  Allergies as of 12/04/2017 - Review Complete 12/04/2017  Allergen Reaction Noted  . Aspirin Nausea And Vomiting 05/12/2006    Family History  Problem Relation Age of Onset  . Stroke Father   . Breast cancer Unknown   . Malignant hyperthermia Neg Hx   . Seizures Neg Hx     Social History   Socioeconomic History  . Marital status: Married    Spouse name: [name]  . Number of children: 0  . Years of education: 9  . Highest education level: Not on file  Occupational History  . Occupation: Retired textile worker    Employer: RETIRED  Social Needs  . Financial resource strain: Not on file  . Food insecurity:    Worry: Not on file    Inability: Not on file  . Transportation needs:    Medical: Not on file    Non-medical: Not on file  Tobacco Use  . Smoking status: Former Smoker    Packs/day: 1.00    Years: 20.00    Pack years: 20.00    Types: Cigarettes    Last attempt to quit: 02/10/1971    Years since quitting: 46.8  . Smokeless tobacco: Never Used  . Tobacco comment: 09/15/2012 "smoked 50-60 yr ago"  Substance and Sexual Activity  . Alcohol use: No    Alcohol/week: 0.0 standard drinks  . Drug use: No  . Sexual activity: Not on file  Lifestyle  . Physical activity:    Days per week: Not on file    Minutes per session: Not on file  . Stress: Not on file  Relationships  . Social connections:    Talks on phone: Not on file    Gets together: Not on file    Attends religious service: Not on file    Active member of club or organization: Not on file    Attends meetings of clubs or organizations: Not on file    Relationship status: Not on file  . Intimate partner violence:    Fear of current or ex partner: Not on file    Emotionally abused: Not on file    Physically abused: Not on file    Forced sexual activity: Not on file  Other Topics Concern  . Not on file  Social History Narrative   Married, lives with husband,  [name].  Ambulates without assistance.     Caffeine use: none    Review of Systems: All negative except as stated above in HPI.  Physical Exam: Vital signs: Vitals:   12/04/17 1400 12/04/17 1629  BP: (!) 103/35 (!) 111/46  Pulse: 67 70  Resp:  18  Temp:  98.5 F (36.9 C)    SpO2: 99% 100%     General:   Elderly, lethargic, Well-developed, well-nourished, pleasant and cooperative in NAD Head: normocephalic, atraumatic Eyes: anicteric sclera ENT: oropharynx clear Neck: supple, nontender Lungs:  Clear throughout to auscultation.   No wheezes, crackles, or rhonchi. No acute distress. Heart:  Regular rate and rhythm; no murmurs, clicks, rubs,  or gallops. Abdomen: soft, nontender, nondistended, +BS Rectal:  Deferred Ext: no edema  GI:  Lab Results: Recent Labs    12/04/17 1230  WBC 13.2*  HGB 7.4*  HCT 24.9*  PLT 187   BMET Recent Labs    12/04/17 1230  NA 139  K 4.0  CL 105  CO2 25  GLUCOSE 325*  BUN 30*  CREATININE 1.54*  CALCIUM 9.3   LFT Recent Labs    12/04/17 1230  PROT 4.4*  ALBUMIN 2.5*  AST 24  ALT 17  ALKPHOS 53  BILITOT 0.6   PT/INR No results for input(s): LABPROT, INR in the last 72 hours.   Studies/Results: No results found.  Impression/Plan: Hematemesis likely due to known AVMs in need of repeat EGD with argon plasma coagulation and evaluation for peptic ulcer disease. EGD today. PPI PO QD but may change to IV pending EGD results. Supportive care.    LOS: 0 days   [name]  12/04/2017, 5:03 PM  Questions please call [phone]

## 2017-12-04 NOTE — H&P (View-Only) (Signed)
Referring Provider: Dr. Sabra Heck Primary Care Physician:  Delice Bison, DO Primary Gastroenterologist:  Dr. Watt Climes  Reason for Consultation:  GI bleed  HPI: [name] is a 77 y.o. female multiple medical problems and history of AVMs with chronic anemia. 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 Has chronic black stools on iron pills. Denies hematochezia. Denies abdominal pain. Felt weak and nauseous. Dizziness with standing. S/P 2 U PRBCs 4 days ago with Hgb 7.9 and now it is 7.4 (8.7 in 11/22/17). History of GERD on Protonix BID at home.  Past Medical History:  Diagnosis Date  . Chronic anemia   . Chronic diastolic CHF (congestive heart failure) ([place]) 10/03/2013  . Chronic GI bleeding    [name] 11/29/2014  . Family history of anesthesia complication    "niece has a hard time coming out" (09/15/2012)  . Frequent nosebleeds    chronic  . Gastric AV malformation    [name] 11/29/2014  . GERD (gastroesophageal reflux disease)   . 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 History of blood transfusion "several"  . History of epistaxis   . HTN (hypertension), benign 03/02/2012  . Hyperlipidemia   . Iron deficiency anemia    chronic infusions"  . Lichen planus    Both lower extremities  . 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 Overgrown toenails 03/18/2017  . Pneumonia 1990's X 2  . Pulmonary HTN ([place]) 04/01/2017   PASP 19mHg on echo 09/2016  . [place]  . Symptomatic anemia 11/29/2014  . Telangiectasia    Gastric   . 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    Past Surgical History:  Procedure Laterality Date  . CATARACT EXTRACTION     "I think it was just one eye"  . ESOPHAGOGASTRODUODENOSCOPY  02/26/2011   Procedure:  ESOPHAGOGASTRODUODENOSCOPY (EGD);  Surgeon: JMissy Sabins MD;  [facility];  Service: Endoscopy;  Laterality: N/A;  . ESOPHAGOGASTRODUODENOSCOPY N/A 11/08/2012   Procedure: ESOPHAGOGASTRODUODENOSCOPY (EGD);  Surgeon: PBeryle Beams MD;  [facility];  Service: Endoscopy;  Laterality: N/A;  . ESOPHAGOGASTRODUODENOSCOPY N/A 10/04/2013   Procedure: ESOPHAGOGASTRODUODENOSCOPY (EGD);  Surgeon: JWinfield Cunas, MD;  [facility];  Service: Endoscopy;  Laterality: N/A;  with APC on stand-by  . ESOPHAGOGASTRODUODENOSCOPY N/A 07/06/2014   Procedure: ESOPHAGOGASTRODUODENOSCOPY (EGD);  Surgeon: MClarene Essex MD;  [facility];  Service: Endoscopy;  Laterality: N/A;  . ESOPHAGOGASTRODUODENOSCOPY N/A 09/05/2014   Procedure: ESOPHAGOGASTRODUODENOSCOPY (EGD);  Surgeon: JLaurence Spates MD;  [facility];  Service: Endoscopy;  Laterality: N/A;  APC on standby to control bleeding  . ESOPHAGOGASTRODUODENOSCOPY N/A 11/29/2014   Procedure: ESOPHAGOGASTRODUODENOSCOPY (EGD);  Surgeon: VWilford Corner MD;  [facility]  Service: Endoscopy;  Laterality: N/A;  . ESOPHAGOGASTRODUODENOSCOPY N/A 09/28/2015   Procedure: ESOPHAGOGASTRODUODENOSCOPY (EGD);  Surgeon: MClarene Essex MD;  [facility];  Service: Endoscopy;  Laterality: N/A;  . ESOPHAGOGASTRODUODENOSCOPY ENDOSCOPY  08/19/2006   with laser treatment  . HOT HEMOSTASIS N/A 07/06/2014   Procedure: HOT HEMOSTASIS (ARGON PLASMA COAGULATION/BICAP);  Surgeon: MClarene Essex MD;  [facility];  Service: Endoscopy;  Laterality: N/A;  . HOT HEMOSTASIS N/A 09/28/2015   Procedure: HOT HEMOSTASIS (ARGON PLASMA COAGULATION/BICAP);  Surgeon: MClarene Essex MD;  [facility];  Service: Endoscopy;  Laterality: N/A;  . NASAL HEMORRHAGE CONTROL     "for bleeding"   . SAVORY DILATION  02/26/2011   Procedure: SAVORY DILATION;  Surgeon: [name] C Hayes, MD;  [name];  Service: Endoscopy;   Laterality: N/A;  c-arm needed    Prior to Admission medications   Medication Sig Start Date End Date Taking? Authorizing Provider  augmented betamethasone dipropionate (DIPROLENE-AF) 0.05 % cream Apply 1 application topically daily. 11/16/17  Yes [provider]  b complex vitamins capsule Take 1 capsule by mouth daily. 10/26/16  Yes Svalina, Gorica, MD  calcium citrate-vitamin D (CITRACAL+D) 315-200 MG-UNIT tablet Take 1 tablet by mouth 2 (two) times daily. 12/31/15 12/04/17 Yes Blum, Nina, MD  cetaphil (CETAPHIL) lotion Apply 1 application topically as needed for dry skin. 02/18/16  Yes Rice, Christopher W, MD  donepezil (ARICEPT) 10 MG tablet Take 1 tablet (10 mg total) by mouth at bedtime. 11/10/17  Yes Millikan, Megan, NP  FERROCITE 324 MG TABS tablet TAKE 1 TABLET BY MOUTH TWICE DAILY 09/27/17  Yes Butcher, Elizabeth A, MD  furosemide (LASIX) 20 MG tablet TAKE 1 TABLET BY MOUTH ONCE DAILY Patient taking differently: Take 20 mg by mouth daily.  05/19/17  Yes Simmons, Brittainy M, PA-C  insulin degludec (TRESIBA FLEXTOUCH) 100 UNIT/ML SOPN FlexTouch Pen Inject 0.22 mLs (22 Units total) into the skin every morning. 11/18/17  Yes Bloomfield, Carley D, DO  lactulose (CHRONULAC) 10 GM/15ML solution TAKE 15 ML BY MOUTH TWO TIMES DAILY 11/18/17  Yes Bloomfield, Carley D, DO  levETIRAcetam (KEPPRA) 500 MG tablet Take 1 tablet (500 mg total) by mouth 2 (two) times daily. 11/10/17  Yes Millikan, Megan, NP  memantine (NAMENDA) 10 MG tablet Take 1 tablet (10 mg total) by mouth 2 (two) times daily. 11/10/17  Yes Millikan, Megan, NP  pantoprazole (PROTONIX) 40 MG tablet Take 1 tablet (40 mg total) by mouth 2 (two) times daily. 07/07/14  Yes Akula, Vijaya, MD  potassium chloride (K-DUR) 10 MEQ tablet Take 1 tablet (10 mEq total) by mouth daily. 04/12/17  Yes Turner, Traci R, MD  pramipexole (MIRAPEX) 0.125 MG tablet TAKE 1 TABLET BY MOUTH ONCE DAILY 08/20/17  Yes Narendra, Nischal, MD  Semaglutide,0.25 or  0.5MG/DOS, (OZEMPIC, 0.25 OR 0.5 MG/DOSE,) 2 MG/1.5ML SOPN Inject 0.5 mg into the skin once a week. 11/01/17  Yes Bloomfield, Carley D, DO  sodium chloride (OCEAN) 0.65 % SOLN nasal spray Place 1 spray into both nostrils as needed for congestion. 07/30/16  Yes Wallace, Andrew, DO  triamcinolone ointment (KENALOG) 0.5 % APPLY 1 APPLICATION TOPICALLY TO RASH TWICE DAILY FOR ITCHING Patient taking differently: Apply 1 application topically 2 (two) times daily as needed (itching).  08/17/17  Yes , Duncan Thomas, MD  Blood Glucose Monitoring Suppl (ONETOUCH VERIO) w/Device KIT  07/07/16   [provider]  Elastic Bandages & Supports (MEDICAL COMPRESSION STOCKINGS) MISC Wear as much as possible while awake to reduce swelling 04/27/16   O'Sullivan, Matthew, MD  glucose blood (ONETOUCH VERIO) test strip Use as instructed 3-4 times daily. E11.29, insulin requiring 03/18/17   Wallace, Andrew, DO  glucose blood (ONETOUCH VERIO) test strip Check blood sugar 4 times a day 09/20/17   Butcher, Elizabeth A, MD  Insulin Pen Needle (B-D UF III MINI PEN NEEDLES) 31G X 5 MM MISC USE TO INJECT INSULIN 3 TIMES DAILY 07/06/17   Wallace, Andrew, DO  ONETOUCH DELICA LANCETS FINE MISC Check blood sugar up to 4 times a day 09/20/17     Bartholomew Crews, MD  oxymetazoline (AFRIN) 0.05 % nasal spray Place 1 spray into both nostrils 2 (two) times daily as needed (Epistaxis). Patient not taking: Reported on 11/10/2017 10/05/13   Robbie Lis, MD  potassium chloride SA (KLOR-CON M20) 20 MEQ tablet TAKE 1 TABLET BY MOUTH ONCE DAILY Patient not taking: Reported on 12/04/2017 09/17/16   Consuelo Pandy, PA-C    Scheduled Meds: . [MAR Hold] sodium chloride   Intravenous Once  . [MAR Hold] sodium chloride   Intravenous Once  . insulin aspart      . [MAR Hold] pantoprazole  40 mg Oral Daily   Continuous Infusions: . sodium chloride    . [MAR Hold] sodium chloride     PRN Meds:.[MAR Hold] acetaminophen **OR** [MAR  Hold] acetaminophen, [MAR Hold] senna-docusate  Allergies as of 12/04/2017 - Review Complete 12/04/2017  Allergen Reaction Noted  . Aspirin Nausea And Vomiting 05/12/2006    Family History  Problem Relation Age of Onset  . Stroke Father   . Breast cancer Unknown   . Malignant hyperthermia Neg Hx   . Seizures Neg Hx     Social History   Socioeconomic History  . Marital status: Married    Spouse name: [name]  . Number of children: 0  . Years of education: 9  . Highest education level: Not on file  Occupational History  . Occupation: Retired Systems developer: RETIRED  Social Needs  . Financial resource strain: Not on file  . Food insecurity:    Worry: Not on file    Inability: Not on file  . Transportation needs:    Medical: Not on file    Non-medical: Not on file  Tobacco Use  . Smoking status: Former Smoker    Packs/day: 1.00    Years: 20.00    Pack years: 20.00    Types: Cigarettes    Last attempt to quit: 02/10/1971    Years since quitting: 46.8  . Smokeless tobacco: Never Used  . Tobacco comment: 09/15/2012 "smoked 50-60 yr ago"  Substance and Sexual Activity  . Alcohol use: No    Alcohol/week: 0.0 standard drinks  . Drug use: No  . Sexual activity: Not on file  Lifestyle  . Physical activity:    Days per week: Not on file    Minutes per session: Not on file  . Stress: Not on file  Relationships  . Social connections:    Talks on phone: Not on file    Gets together: Not on file    Attends religious service: Not on file    Active member of club or organization: Not on file    Attends meetings of clubs or organizations: Not on file    Relationship status: Not on file  . Intimate partner violence:    Fear of current or ex partner: Not on file    Emotionally abused: Not on file    Physically abused: Not on file    Forced sexual activity: Not on file  Other Topics Concern  . Not on file  Social History Narrative   Married, lives with husband,  [name].  Ambulates without assistance.     Caffeine use: none    Review of Systems: All negative except as stated above in HPI.  Physical Exam: Vital signs: Vitals:   12/04/17 1400 12/04/17 1629  BP: (!) 103/35 (!) 111/46  Pulse: 67 70  Resp:  18  Temp:  98.5 F (36.9 C)  SpO2: 99% 100%     General:   Elderly, lethargic, Well-developed, well-nourished, pleasant and cooperative in NAD Head: normocephalic, atraumatic Eyes: anicteric sclera ENT: oropharynx clear Neck: supple, nontender Lungs:  Clear throughout to auscultation.   No wheezes, crackles, or rhonchi. No acute distress. Heart:  Regular rate and rhythm; no murmurs, clicks, rubs,  or gallops. Abdomen: soft, nontender, nondistended, +BS Rectal:  Deferred Ext: no edema  GI:  Lab Results: Recent Labs    12/04/17 1230  WBC 13.2*  HGB 7.4*  HCT 24.9*  PLT 187   BMET Recent Labs    12/04/17 1230  NA 139  K 4.0  CL 105  CO2 25  GLUCOSE 325*  BUN 30*  CREATININE 1.54*  CALCIUM 9.3   LFT Recent Labs    12/04/17 1230  PROT 4.4*  ALBUMIN 2.5*  AST 24  ALT 17  ALKPHOS 53  BILITOT 0.6   PT/INR No results for input(s): LABPROT, INR in the last 72 hours.   Studies/Results: No results found.  Impression/Plan: Hematemesis likely due to known AVMs in need of repeat EGD with argon plasma coagulation and evaluation for peptic ulcer disease. EGD today. PPI PO QD but may change to IV pending EGD results. Supportive care.    LOS: 0 days   [name]  12/04/2017, 5:03 PM  Questions please call [phone]

## 2017-12-05 DIAGNOSIS — K254 Chronic or unspecified gastric ulcer with hemorrhage: Principal | ICD-10-CM

## 2017-12-05 DIAGNOSIS — I110 Hypertensive heart disease with heart failure: Secondary | ICD-10-CM

## 2017-12-05 DIAGNOSIS — I5032 Chronic diastolic (congestive) heart failure: Secondary | ICD-10-CM

## 2017-12-05 DIAGNOSIS — D500 Iron deficiency anemia secondary to blood loss (chronic): Secondary | ICD-10-CM

## 2017-12-05 DIAGNOSIS — Z9889 Other specified postprocedural states: Secondary | ICD-10-CM

## 2017-12-05 DIAGNOSIS — K228 Other specified diseases of esophagus: Secondary | ICD-10-CM

## 2017-12-05 DIAGNOSIS — K31811 Angiodysplasia of stomach and duodenum with bleeding: Secondary | ICD-10-CM

## 2017-12-05 DIAGNOSIS — I780 Hereditary hemorrhagic telangiectasia: Secondary | ICD-10-CM

## 2017-12-05 DIAGNOSIS — Z794 Long term (current) use of insulin: Secondary | ICD-10-CM

## 2017-12-05 DIAGNOSIS — E119 Type 2 diabetes mellitus without complications: Secondary | ICD-10-CM

## 2017-12-05 DIAGNOSIS — Z79899 Other long term (current) drug therapy: Secondary | ICD-10-CM

## 2017-12-05 DIAGNOSIS — E785 Hyperlipidemia, unspecified: Secondary | ICD-10-CM

## 2017-12-05 DIAGNOSIS — K219 Gastro-esophageal reflux disease without esophagitis: Secondary | ICD-10-CM

## 2017-12-05 DIAGNOSIS — K209 Esophagitis, unspecified: Secondary | ICD-10-CM

## 2017-12-05 LAB — BPAM RBC
Blood Product Expiration Date: 201911182359
Blood Product Expiration Date: 201911182359
ISSUE DATE / TIME: 201910262307
ISSUE DATE / TIME: 201910262038
Unit Type and Rh: 6200
Unit Type and Rh: 6200

## 2017-12-05 LAB — CBC
HCT: 27.9 % — ABNORMAL LOW (ref 36.0–46.0)
HCT: 26.7 % — ABNORMAL LOW (ref 36.0–46.0)
HCT: 28.2 % — ABNORMAL LOW (ref 36.0–46.0)
Hemoglobin: 8.6 g/dL — ABNORMAL LOW (ref 12.0–15.0)
Hemoglobin: 8 g/dL — ABNORMAL LOW (ref 12.0–15.0)
Hemoglobin: 8.6 g/dL — ABNORMAL LOW (ref 12.0–15.0)
MCH: 28.2 pg (ref 26.0–34.0)
MCH: 28.1 pg (ref 26.0–34.0)
MCH: 28.6 pg (ref 26.0–34.0)
MCHC: 30.8 g/dL (ref 30.0–36.0)
MCHC: 30 g/dL (ref 30.0–36.0)
MCHC: 30.5 g/dL (ref 30.0–36.0)
MCV: 91.5 fL (ref 80.0–100.0)
MCV: 93.7 fL (ref 80.0–100.0)
MCV: 93.7 fL (ref 80.0–100.0)
PLATELETS: 134 10*3/uL — AB (ref 150–400)
Platelets: 124 K/uL — ABNORMAL LOW (ref 150–400)
Platelets: 131 K/uL — ABNORMAL LOW (ref 150–400)
RBC: 3.05 MIL/uL — AB (ref 3.87–5.11)
RBC: 2.85 MIL/uL — ABNORMAL LOW (ref 3.87–5.11)
RBC: 3.01 MIL/uL — ABNORMAL LOW (ref 3.87–5.11)
RDW: 18.6 % — AB (ref 11.5–15.5)
RDW: 19 % — ABNORMAL HIGH (ref 11.5–15.5)
RDW: 18.9 % — ABNORMAL HIGH (ref 11.5–15.5)
WBC: 7.9 10*3/uL (ref 4.0–10.5)
WBC: 6.2 K/uL (ref 4.0–10.5)
WBC: 5.9 K/uL (ref 4.0–10.5)
nRBC: 0 % (ref 0.0–0.2)
nRBC: 0 % (ref 0.0–0.2)
nRBC: 0 % (ref 0.0–0.2)

## 2017-12-05 LAB — GLUCOSE, CAPILLARY
Glucose-Capillary: 55 mg/dL — ABNORMAL LOW (ref 70–99)
Glucose-Capillary: 129 mg/dL — ABNORMAL HIGH (ref 70–99)
Glucose-Capillary: 60 mg/dL — ABNORMAL LOW (ref 70–99)
Glucose-Capillary: 108 mg/dL — ABNORMAL HIGH (ref 70–99)
Glucose-Capillary: 206 mg/dL — ABNORMAL HIGH (ref 70–99)
Glucose-Capillary: 50 mg/dL — ABNORMAL LOW (ref 70–99)
Glucose-Capillary: 67 mg/dL — ABNORMAL LOW (ref 70–99)
Glucose-Capillary: 87 mg/dL (ref 70–99)

## 2017-12-05 LAB — BASIC METABOLIC PANEL
Anion gap: 3 — ABNORMAL LOW (ref 5–15)
BUN: 33 mg/dL — AB (ref 8–23)
CALCIUM: 8.8 mg/dL — AB (ref 8.9–10.3)
CO2: 29 mmol/L (ref 22–32)
CREATININE: 1.51 mg/dL — AB (ref 0.44–1.00)
Chloride: 110 mmol/L (ref 98–111)
GFR calc Af Amer: 37 mL/min — ABNORMAL LOW (ref >60)
GFR, EST NON AFRICAN AMERICAN: 32 mL/min — AB (ref >60)
GLUCOSE: 75 mg/dL (ref 70–99)
Potassium: 3.8 mmol/L (ref 3.5–5.1)
Sodium: 142 mmol/L (ref 135–145)

## 2017-12-05 LAB — TYPE AND SCREEN
ABO/RH(D): A POS
ANTIBODY SCREEN: NEGATIVE
UNIT DIVISION: 0
Unit division: 0

## 2017-12-05 MED ORDER — SODIUM CHLORIDE 0.9% FLUSH
10.0000 mL | INTRAVENOUS | Status: DC | PRN
  Administered 2017-12-06: 10 mL
  Filled 2017-12-05: qty 40

## 2017-12-05 MED ORDER — DEXTROSE 50 % IV SOLN
INTRAVENOUS | Status: AC
  Administered 2017-12-05: 25 mL
  Filled 2017-12-05: qty 50

## 2017-12-05 NOTE — Progress Notes (Signed)
Hypoglycemic Event  CBG: 60  Treatment: D50 IV 25 mL  Symptoms: None  Follow-up CBG: Time:1349 CBG Result:108  Possible Reasons for Event: Inadequate meal intake  Comments/MD notified:IV Dextrose 25 mL was effective.    Holley Raring

## 2017-12-05 NOTE — Progress Notes (Addendum)
   Subjective: [name] reported feeling well today.  He denied any lightheadedness, weakness, headaches, nominal pain, nausea or vomiting.  She did report that she was spitting up some blood this morning seen by her bedside.  She denied any excessive NSAID, ibuprofen, or Aleve use.  Denied any alcohol use.  Patient's niece stated that she recently recommended Immunocal to her aunt, which is a protein supplement.  She was concerned that this may have contributed to patient's gastric ulcer.  Objective:  Vital signs in last 24 hours: Vitals:   12/04/17 2326 12/05/17 0116 12/05/17 0252 12/05/17 0826  BP: (!) 102/44 (!) 113/44 (!) 107/47   Pulse: 65 63 66   Resp: 10 14 18    Temp: 98.2 F (36.8 C) 98.2 F (36.8 C)  98.1 F (36.7 C)  TempSrc: Oral Oral  Oral  SpO2: 100% 100% 100%   Weight:      Height:       Physical Exam  Constitutional: She is oriented to person, place, and time and well-developed, well-nourished, and in no distress.  HENT:  Telangectasia's seen in the mouth  Cardiovascular: Normal rate, regular rhythm and normal heart sounds.  No murmur heard. Pulmonary/Chest: Effort normal and breath sounds normal. No respiratory distress. She has no wheezes.  Abdominal: Soft. Bowel sounds are normal. She exhibits no distension. There is no tenderness.  Musculoskeletal: She exhibits no edema.  Neurological: She is alert and oriented to person, place, and time.  Skin: Skin is warm and dry.    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   Hematemesis 2/2 gastric ulcer  - Hgb 8.6 after 2 units PRBCs  - appreciate GI recommendations - endoscopy showed a gastric ulcer which was clipped and a bleeding angiodysplastic lesion which was  ablated - IV pantoprazole per GI - diet clear liquids, advance as tolerated tomorrow - cardiac monitoring  - Hgb goal 7.0, transfuse if <7.0 - f/u am cbc  HTN - 106/47 - hold furesomide  Type II DM - Lantus 15 units and SSI  - CBG monitoring   GERD - pantoprazole 40 mg IV per GI   Dispo: Anticipated discharge in approximately 1-2 day(s).   Archimedes Harold N, DO 12/05/2017, 12:09 PM Pager: [phone]

## 2017-12-05 NOTE — Progress Notes (Signed)
  Date: 12/05/2017  Patient name: [name]  Medical record number: [id number]  Date of birth: 01/18/1941   I have seen and evaluated [name] and discussed their care with the Residency Team. [name] is a 77 yo female with definitive HHT (epistaxis, multiple mucocutaneous telangiectasia, & visceral involvement).  She gets weekly hemoglobin checks per Dr. Darnell Level and requires about monthly transfusions.  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  Her most recent EGD was in August 2017.  On the day of admission, she had 3 episodes of hematemesis without any associated symptoms.  She presented to the ED and received 2 units PRBCs for a hemoglobin of 7.4 and known GI bleeding.  GI evaluated the patient and immediately took her to EGD which showed bleeding esophagitis, an oozing gastric ulcer which was injected and clipped, and a bleeding gastric AVM which was treated with APC.  Although her hemoglobin did not increase as expected after the 2 units PRBC, her hemoglobin has been otherwise stable along with her vital signs.  She has remained on an IV PPI and is still n.p.o.  She denies any nonsteroidal use or alcohol use.  PMHx, Fam Hx, and/or Soc Hx : Nieces comes on weekends to assist  Vitals:   12/05/17 0252 12/05/17 0826  BP: (!) 107/47   Pulse: 66   Resp: 18   Temp:  98.1 F (36.7 C)  SpO2: 100%   Afebrile since admission NAD HEENT : EOMI, + telangectasias hard palate HRRR no MRG LCTAB with good air flow ABD + BS, soft  Cr 1.54 - 1.51 (variable baseline, maybe around 1.2) 7.4 - 2 units- 8.6 Plts 187 - 134  Assessment and Plan: I have seen and evaluated the patient as outlined above. I agree with the formulated Assessment and Plan as detailed in the residents' note, with the following changes: [name] is a 77 yo female with definitive HHT who presents with hematemesis.  Her EGD showed food sources of bleeding including bleeding esophagitis, and oozing gastric  ulcer, and a bleeding gastric AVM.  Dr. Jules Husbands treated these in endoscopy.  We are continuing her IV PPI and n.p.o. status until cleared by GI.  The EGD report indicates no specimens were taken so we will assess for H. pylori as she has no other definitive etiology for a gastric ulcer. Per Epic, she has never had H. pylori serology nor histology so checking serum H. pylori antibody will be adequate as stool H. pylori antigen sensitivity can be decreased and gastric ulcer bleeding.  1.  Upper GI bleed - await GIs clearance to start a diet and stop IV PPI.  Continue to check hemoglobin serially.  Check H. pylori serum Ab.   Bartholomew Crews, MD 10/27/201911:25 AM

## 2017-12-05 NOTE — Progress Notes (Signed)
[facility] Progress Note  [name] 77 y.o. 1940-11-06   Subjective: No bleeding overnight. Feels ok. Niece in room.  Objective: Vital signs: Vitals:   12/05/17 0252 12/05/17 0826  BP: (!) 107/47   Pulse: 66   Resp: 18   Temp:  98.1 F (36.7 C)  SpO2: 100%     Physical Exam: Gen: alert, no acute distress, elderly, well-nourished HEENT: anicteric sclera CV: RRR Chest: CTA B Abd: soft, nontender, nondistended, +BS Ext: no edema  Lab Results: Recent Labs    12/04/17 1230 12/05/17 0250  NA 139 142  K 4.0 3.8  CL 105 110  CO2 25 29  GLUCOSE 325* 75  BUN 30* 33*  CREATININE 1.54* 1.51*  CALCIUM 9.3 8.8*   Recent Labs    12/04/17 1230  AST 24  ALT 17  ALKPHOS 53  BILITOT 0.6  PROT 4.4*  ALBUMIN 2.5*   Recent Labs    12/04/17 1230 12/05/17 0250  WBC 13.2* 7.9  HGB 7.4* 8.6*  HCT 24.9* 27.9*  MCV 100.8* 91.5  PLT 187 134*      Assessment/Plan: Gastric ulcer bleed - s/p epi injection and hemoclip on EGD yesterday as well as argon plasma coagulation of a gastric AVM. No further bleeding seen. Hgb improved to 8.6 from 7.4 after 2 U PRBCs. Continue Protonix drip. Clear liquid diet and if stable then slowly advance tomorrow. [facility] will f/u tomorrow.   [name] 12/05/2017, 11:21 AM  Questions please call [phone] ID: Sarina Ser, female   DOB: 05/29/1940, 77 y.o.   MRN: [id number]

## 2017-12-05 NOTE — Progress Notes (Signed)
Hypoglycemic Event  CBG: 55  Treatment: 64mL Dextrose injection @ 8:19 am  Symptoms: None  Follow-up CBG: JJHE:1740 CBG Result:129  Possible Reasons for Event: Inadequate meal intake  Comments/MD notified: IV dextrose was effective.    Holley Raring

## 2017-12-06 ENCOUNTER — Other Ambulatory Visit: Payer: Self-pay | Admitting: Internal Medicine

## 2017-12-06 ENCOUNTER — Other Ambulatory Visit: Payer: Medicare Other

## 2017-12-06 ENCOUNTER — Encounter (HOSPITAL_COMMUNITY): Payer: Self-pay | Admitting: Gastroenterology

## 2017-12-06 DIAGNOSIS — Z886 Allergy status to analgesic agent status: Secondary | ICD-10-CM

## 2017-12-06 LAB — GLUCOSE, CAPILLARY
GLUCOSE-CAPILLARY: 257 mg/dL — AB (ref 70–99)
GLUCOSE-CAPILLARY: 268 mg/dL — AB (ref 70–99)
GLUCOSE-CAPILLARY: 69 mg/dL — AB (ref 70–99)
GLUCOSE-CAPILLARY: 82 mg/dL (ref 70–99)
Glucose-Capillary: 278 mg/dL — ABNORMAL HIGH (ref 70–99)

## 2017-12-06 LAB — CBC
HCT: 28.2 % — ABNORMAL LOW (ref 36.0–46.0)
HEMOGLOBIN: 8.6 g/dL — AB (ref 12.0–15.0)
MCH: 28.6 pg (ref 26.0–34.0)
MCHC: 30.5 g/dL (ref 30.0–36.0)
MCV: 93.7 fL (ref 80.0–100.0)
NRBC: 0 % (ref 0.0–0.2)
PLATELETS: 128 10*3/uL — AB (ref 150–400)
RBC: 3.01 MIL/uL — AB (ref 3.87–5.11)
RDW: 18.3 % — ABNORMAL HIGH (ref 11.5–15.5)
WBC: 5.7 10*3/uL (ref 4.0–10.5)

## 2017-12-06 MED ORDER — LEVETIRACETAM 500 MG PO TABS
500.0000 mg | ORAL_TABLET | Freq: Two times a day (BID) | ORAL | Status: DC
  Administered 2017-12-06: 500 mg via ORAL
  Filled 2017-12-06: qty 2

## 2017-12-06 MED ORDER — HEPARIN SOD (PORK) LOCK FLUSH 100 UNIT/ML IV SOLN
500.0000 [IU] | INTRAVENOUS | Status: AC | PRN
  Administered 2017-12-06: 500 [IU]

## 2017-12-06 MED ORDER — PANTOPRAZOLE SODIUM 40 MG PO TBEC: 40.0000 mg | DELAYED_RELEASE_TABLET | Freq: Every day | ORAL | 0 refills | Status: DC

## 2017-12-06 NOTE — Progress Notes (Signed)
Inpatient Diabetes Program Recommendations  AACE/ADA: New Consensus Statement on Inpatient Glycemic Control (2015)  Target Ranges:  Prepandial:   less than 140 mg/dL      Peak postprandial:   less than 180 mg/dL (1-2 hours)      Critically ill patients:  140 - 180 mg/dL   Lab Results  Component Value Date   GLUCAP 82 12/06/2017   HGBA1C 5.9 (A) 07/15/2017    Review of Glycemic Control Results for [name], [name] (MRN [id number]) as of 12/06/2017 11:10  Ref. Range 12/05/2017 22:37 12/05/2017 22:53 12/05/2017 23:07 12/06/2017 08:53 12/06/2017 09:20  Glucose-Capillary Latest Ref Range: 70 - 99 mg/dL 50 (L) 67 (L) 87 69 (L) 82   Diabetes history: Type 2 DM Outpatient Diabetes medications: Tresiba 22 units QAM, Ozempic 0.5 mg Q/wk Current orders for Inpatient glycemic control: Novolog 0-15 units TID, Novolog 0-5 units QHS, Lantus 15 units QHS  Inpatient Diabetes Program Recommendations:    Noted multiple episodes of hypoglycemia. On 10/27, patient was corrected for a blood glucose of 206 mg/dL, following a hypoglycemic intervention with Dextrose, thus further contributing to patient having lows.  Recommending decreasing correction to Novolog 0-9 units TID and decreasing Lantus to 8 units QHS.  Thanks, Bronson Curb, MSN, RNC-OB Diabetes Coordinator [phone] (8a-5p)

## 2017-12-06 NOTE — Progress Notes (Signed)
Hypoglycemic Event  CBG: 69  Treatment: 15 GM carbohydrate snack  Symptoms: None  Follow-up CBG: CQPE:4835 CBG Result:82  Possible Reasons for Event: Inadequate meal intake  Comments/MD notified: 15/15 effective    Altamease Oiler

## 2017-12-06 NOTE — Progress Notes (Signed)
[name] 1:10 PM  Subjective: Patient doing well without any signs of obvious bleeding and she did have a Studley pain after a bowel movement today and her case discussed with my partner Dr. Michail Sermon and her niece and she has no new complaints Objective: Signs stable afebrile no acute distress abdomen is soft nontender hemoglobin stable normal white count  Assessment: Recurrent AVM bleeding in a patient with gastric ulcer as well  Plan: Okay to slowly advance diet and call us when necessary and follow-up with [place] in the office in a month or so  [facility] E  Pager [phone] After 5PM or if no answer call [phone]

## 2017-12-06 NOTE — Progress Notes (Signed)
Internal Medicine Attending:   I saw and examined the patient. I reviewed the resident's note and I agree with the resident's findings and plan as documented in the resident's note.  Patient feels well today and states she wants to go home.  No more episodes of bleeding or hematemesis.  Patient is tolerating clear liquid diet well.  Will advance her diet slowly today.  Patient was admitted to the hospital with acute GI bleed secondary to gastric ulcer status post epi injection and Hemoclip as well as a gastric AVM.  Hemoglobin has remained stable at 8.6.  Will transition PPI to oral.  Patient is likely stable for discharge home today if no further episodes of bleeding.  GI follow-up recommendations appreciated.

## 2017-12-06 NOTE — Progress Notes (Signed)
   Subjective: Ms. Icenhour reported she was ready to go home. She denied any lightheadedness, dizziness, or weakness. No bleeding or any more episodes of hematemesis. She tolerated a clear liquid diet well and will advance today.   Objective:  Vital signs in last 24 hours: Vitals:   12/05/17 1712 12/05/17 2108 12/06/17 0043 12/06/17 0359  BP: (!) 104/52 (!) 121/52 (!) 116/53   Pulse: (!) 58 65 61   Resp: 15 17 20    Temp: 98.2 F (36.8 C) 98.2 F (36.8 C) 98 F (36.7 C) 98.2 F (36.8 C)  TempSrc: Oral Oral Oral Oral  SpO2: 100% 100% 100%   Weight:      Height:       Physical Exam  Constitutional: She is oriented to person, place, and time and well-developed, well-nourished, and in no distress.  Cardiovascular: Normal rate, regular rhythm and normal heart sounds.  No murmur heard. Pulmonary/Chest: Effort normal and breath sounds normal. No respiratory distress. She has no wheezes.  Abdominal: Soft. She exhibits no distension. There is no tenderness.  Musculoskeletal: She exhibits no edema.  Neurological: She is alert and oriented to person, place, and time.  Skin: Skin is warm and dry.    Assessment/Plan:  Active Problems:   Hematemesis  Ms. 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  Hematemesis 2/2 gastric ulcer  - Hgb 8.6 this morning; received a total of 2 units PRBCs  - appreciate GI recommendations -endoscopy showed a gastric ulcer which was clipped and a bleeding angiodysplastic lesion which was ablated - IV pantoprazole per GI - diet clear liquids, advance as tolerated tomorrow - cardiac monitoring - Hgb goal 7.0, transfuse if <7.0  HTN - 111/40 - hold furesomide  Type II DM - Lantus 15 units and SSI  - CBG monitoring  GERD -pantoprazole 40 mg IV per GI     Dispo: Anticipated discharge is today.  Mike Craze, DO 12/06/2017, 6:59 AM Pager: [phone]

## 2017-12-06 NOTE — Discharge Summary (Signed)
Name: [name] MRN: [id number] DOB: Jun 18, 1940 77 y.o. PCP: Delice Bison, DO  Date of Admission: 12/04/2017 12:12 PM Date of Discharge: 12/06/2017 Attending Physician: Bartholomew Crews, MD  Discharge Diagnosis: 1. Acute GI bleed secondary to gastric ulcer   Discharge Medications: Allergies as of 12/06/2017      Reactions   Aspirin Nausea And Vomiting      Medication List    TAKE these medications   augmented betamethasone dipropionate 0.05 % cream Commonly known as:  DIPROLENE-AF Apply 1 application topically daily.   b complex vitamins capsule Take 1 capsule by mouth daily.   calcium citrate-vitamin D 315-200 MG-UNIT tablet Commonly known as:  CITRACAL+D Take 1 tablet by mouth 2 (two) times daily.   cetaphil lotion Apply 1 application topically as needed for dry skin.   donepezil 10 MG tablet Commonly known as:  ARICEPT Take 1 tablet (10 mg total) by mouth at bedtime.   FERROCITE 324 (106 Fe) MG Tabs tablet Generic drug:  Ferrous Fumarate TAKE 1 TABLET BY MOUTH TWICE DAILY   furosemide 20 MG tablet Commonly known as:  LASIX TAKE 1 TABLET BY MOUTH ONCE DAILY   glucose blood test strip Use as instructed 3-4 times daily. E11.29, insulin requiring   glucose blood test strip Check blood sugar 4 times a day   insulin degludec 100 UNIT/ML Sopn FlexTouch Pen Commonly known as:  TRESIBA Inject 0.22 mLs (22 Units total) into the skin every morning.   Insulin Pen Needle 31G X 5 MM Misc USE TO INJECT INSULIN 3 TIMES DAILY   lactulose 10 GM/15ML solution Commonly known as:  CHRONULAC TAKE 15 ML BY MOUTH TWO TIMES DAILY   levETIRAcetam 500 MG tablet Commonly known as:  KEPPRA Take 1 tablet (500 mg total) by mouth 2 (two) times daily.   Medical Compression Stockings Misc Wear as much as possible while awake to reduce swelling   memantine 10 MG tablet Commonly known as:  NAMENDA Take 1 tablet (10 mg total) by mouth 2 (two) times daily.     ONETOUCH DELICA LANCETS FINE Misc Check blood sugar up to 4 times a day   ONETOUCH VERIO w/Device Kit   oxymetazoline 0.05 % nasal spray Commonly known as:  AFRIN Place 1 spray into both nostrils 2 (two) times daily as needed (Epistaxis).   pantoprazole 40 MG tablet Commonly known as:  PROTONIX Take 1 tablet (40 mg total) by mouth 2 (two) times daily.   potassium chloride 10 MEQ tablet Commonly known as:  K-DUR Take 1 tablet (10 mEq total) by mouth daily.   potassium chloride SA 20 MEQ tablet Commonly known as:  K-DUR,KLOR-CON TAKE 1 TABLET BY MOUTH ONCE DAILY   pramipexole 0.125 MG tablet Commonly known as:  MIRAPEX TAKE 1 TABLET BY MOUTH ONCE DAILY   Semaglutide(0.25 or 0.5MG/DOS) 2 MG/1.5ML Sopn Inject 0.5 mg into the skin once a week.   sodium chloride 0.65 % Soln nasal spray Commonly known as:  OCEAN Place 1 spray into both nostrils as needed for congestion.   triamcinolone ointment 0.5 % Commonly known as:  KENALOG APPLY 1 APPLICATION TOPICALLY TO RASH TWICE DAILY FOR ITCHING What changed:  See the new instructions.       Disposition and follow-up:   [name] was discharged from [facility] in Stable condition.  At the hospital follow up visit please address:  1.  Acute GI bleed 2/2 gastric ulcer- please address if patient has  had any more episodes of hematemesis and recheck cbc  2.  Labs / imaging needed at time of follow-up: cbc  3.  Pending labs/ test needing follow-up: h pylori antibody   Follow-up Appointments: Follow-up Information    [place]. Go on 12/13/2017.   Why:  10:15 am Contact information: [address] [place] Hospital Course by problem list: 1. Acute GI bleed 2/2 gastric ulcer-presented with 3 episodes of hematemesis.  She was in her normal state of health until the morning when she was trying to have a bowel movement and  suddenly had 3 episodes of hematemesis.  She denied any preceding symptoms of abdominal pain, nausea, fever or any recent sick contacts.  She said she recently had a blood transfusion 4 days ago which she tolerated well.  She said she gets blood transfusions about once a month. CBC showed a Hgb of 7.4 with plans to transfuse ~2 units. GI performed an upper endoscopy which showed a gastric ulcer and gastric AVMs that were clipped and ablated, respectively. She was started on IV protonix and transitioned to oral PPI on day of discharge. Repeat CBC remained stable at 8.6 with no further episodes of bleeding.  Discharge Vitals:   BP (!) 116/53 (BP Location: Right Arm)   Pulse 61   Temp 98.1 F (36.7 C) (Oral)   Resp 20   Ht _0  (1.6 m)   Wt 72.8 kg   LMP  (LMP Unknown)   SpO2 100%   BMI 28.43 kg/m   Pertinent Labs, Studies, and Procedures:   CBC Latest Ref Rng & Units 12/06/2017 12/05/2017 12/05/2017  WBC 4.0 - 10.5 K/uL 5.7 5.9 6.2  Hemoglobin 12.0 - 15.0 g/dL 8.6(L) 8.6(L) 8.0(L)  Hematocrit 36.0 - 46.0 % 28.2(L) 28.2(L) 26.7(L)  Platelets 150 - 400 K/uL 128(L) 131(L) 124(L)   CMP Latest Ref Rng & Units 12/05/2017 12/04/2017 11/18/2017  Glucose 70 - 99 mg/dL 75 325(H) 123(H)  BUN 8 - 23 mg/dL 33(H) 30(H) 20  Creatinine 0.44 - 1.00 mg/dL 1.51(H) 1.54(H) 1.00  Sodium 135 - 145 mmol/L 142 139 145(H)  Potassium 3.5 - 5.1 mmol/L 3.8 4.0 4.1  Chloride 98 - 111 mmol/L 110 105 105  CO2 22 - 32 mmol/L _1 Calcium 8.9 - 10.3 mg/dL 8.8(L) 9.3 9.3  Total Protein 6.5 - 8.1 g/dL - 4.4(L) -  Total Bilirubin 0.3 - 1.2 mg/dL - 0.6 -  Alkaline Phos 38 - 126 U/L - 53 -  AST 15 - 41 U/L - 24 -  ALT 0 - 44 U/L - 17 -   Discharge Instructions: Discharge Instructions    Diet - low sodium heart healthy   Complete by:  As directed    Discharge instructions   Complete by:  As directed    [name],  It was a pleasure taking care of you during your hospital stay. Please restart all your  medications as prescribed and follow up with the [facility] clinic on November 4th at 10:15 am.   Increase activity slowly   Complete by:  As directed       Signed: Mike Craze, DO 12/06/2017, 1:42 PM   Pager: [phone]

## 2017-12-07 LAB — H. PYLORI ANTIBODY, IGG: H Pylori IgG: 0.3 Index Value (ref 0.00–0.79)

## 2017-12-10 ENCOUNTER — Telehealth: Payer: Self-pay | Admitting: *Deleted

## 2017-12-10 NOTE — Telephone Encounter (Signed)
Call from [facility] with [facility] the heart program. Stated pt has an appt on Monday (HFU); wanted to inform her doctor of 5 lbs weight loss. She will fax this info tho our office.

## 2017-12-13 ENCOUNTER — Other Ambulatory Visit: Payer: Self-pay

## 2017-12-13 ENCOUNTER — Other Ambulatory Visit: Payer: Medicare Other

## 2017-12-13 ENCOUNTER — Ambulatory Visit (INDEPENDENT_AMBULATORY_CARE_PROVIDER_SITE_OTHER): Payer: Medicare Other | Admitting: Internal Medicine

## 2017-12-13 ENCOUNTER — Encounter: Payer: Self-pay | Admitting: Internal Medicine

## 2017-12-13 VITALS — BP 117/42 | HR 66 | Temp 98.1°F | Ht 63.0 in | Wt 149.0 lb

## 2017-12-13 DIAGNOSIS — K920 Hematemesis: Secondary | ICD-10-CM

## 2017-12-13 DIAGNOSIS — D62 Acute posthemorrhagic anemia: Secondary | ICD-10-CM | POA: Diagnosis not present

## 2017-12-13 DIAGNOSIS — K31819 Angiodysplasia of stomach and duodenum without bleeding: Secondary | ICD-10-CM

## 2017-12-13 DIAGNOSIS — K254 Chronic or unspecified gastric ulcer with hemorrhage: Secondary | ICD-10-CM | POA: Diagnosis not present

## 2017-12-13 DIAGNOSIS — R011 Cardiac murmur, unspecified: Secondary | ICD-10-CM

## 2017-12-13 DIAGNOSIS — K209 Esophagitis, unspecified: Secondary | ICD-10-CM

## 2017-12-13 DIAGNOSIS — Z79899 Other long term (current) drug therapy: Secondary | ICD-10-CM

## 2017-12-13 DIAGNOSIS — I780 Hereditary hemorrhagic telangiectasia: Secondary | ICD-10-CM | POA: Diagnosis not present

## 2017-12-13 DIAGNOSIS — D500 Iron deficiency anemia secondary to blood loss (chronic): Secondary | ICD-10-CM | POA: Diagnosis not present

## 2017-12-13 LAB — SAMPLE TO BLOOD BANK

## 2017-12-13 LAB — CBC
HEMATOCRIT: 33.3 % — AB (ref 36.0–46.0)
Hemoglobin: 9.9 g/dL — ABNORMAL LOW (ref 12.0–15.0)
MCH: 27.8 pg (ref 26.0–34.0)
MCHC: 29.7 g/dL — ABNORMAL LOW (ref 30.0–36.0)
MCV: 93.5 fL (ref 80.0–100.0)
Platelets: 246 10*3/uL (ref 150–400)
RBC: 3.56 MIL/uL — ABNORMAL LOW (ref 3.87–5.11)
RDW: 15.9 % — AB (ref 11.5–15.5)
WBC: 5.8 10*3/uL (ref 4.0–10.5)
nRBC: 0 % (ref 0.0–0.2)

## 2017-12-13 LAB — GLUCOSE, CAPILLARY: Glucose-Capillary: 140 mg/dL — ABNORMAL HIGH (ref 70–99)

## 2017-12-13 NOTE — Patient Instructions (Addendum)
Thank you for coming to the clinic today. It was a pleasure to see you.   Your hemoglobin (red blood cell level) was within a good level for you today. Keep watching the symptoms you are having and call us at the clinic if things are getting worse such as more bleeding or if you start to have chest pain, difficulty breathing, or dizziness.   FOLLOW-UP INSTRUCTIONS When: next week November 11 in the acute care clinic  For: follow up on how you are feeling  What to bring: all of your medication bottles   Please call the internal medicine center clinic if you have any questions or concerns, we may be able to help and keep you from a long and expensive emergency room wait. Our clinic and after hours phone number is [phone], the best time to call is Monday through Friday 9 am to 4 pm but there is always someone available 24/7 if you have an emergency. If you need medication refills please notify your pharmacy one week in advance and they will send [place] a request.

## 2017-12-13 NOTE — Progress Notes (Signed)
   CC: hospital follow up   HPI:  [name] is a 77 y.o. with PMH as listed below who presents for hospital follow up. Please see the assessment and plans for the status of the patient chronic medical problems.   Past Medical History:  Diagnosis Date  . Chronic anemia   . Chronic diastolic CHF (congestive heart failure) ([place]) 10/03/2013  . Chronic GI bleeding    [name] 11/29/2014  . Family history of anesthesia complication    "niece has a hard time coming out" (09/15/2012)  . Frequent nosebleeds    chronic  . Gastric AV malformation    [name] 11/29/2014  . GERD (gastroesophageal reflux disease)   . 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 History of blood transfusion "several"  . History of epistaxis   . HTN (hypertension), benign 03/02/2012  . Hyperlipidemia   . Iron deficiency anemia    chronic infusions"  . Lichen planus    Both lower extremities  . 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 Overgrown toenails 03/18/2017  . Pneumonia 1990's X 2  . Pulmonary HTN ([place]) 04/01/2017   PASP 32mmHg on echo 09/2016  . Seizures ([place]) 09/2014  . Symptomatic anemia 11/29/2014  . Telangiectasia    Gastric   . 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   Review of Systems:  Refer to history of present illness and assessment and plans for pertinent review of systems, all others reviewed and negative  Physical Exam:  Vitals:   12/13/17 1052  BP: (!) 117/42  Pulse: 66  Temp: 98.1 F (36.7 C)  TempSrc: Oral  SpO2: 99%  Weight: 149 lb (67.6 kg)  Height: 5\' 3"  (1.6 m)   General: well appearing, no acute distress  HEENT: moist mucous membranes, conjunctiva are well perfused  Cardiac: systolic murmur loudest over RUSB, no peripheral edema Pulm: normal work of breathing, lungs clear to auscultation   Assessment & Plan:   History of bleeding gastric ulcer  History of anemia of blood loss  Recently hospitalized for an acute GI  bleed from gastric ulcers. Upper endoscopy revealed a erosive esophagitis, gastric ulcer, and gastric AVMs which treated. H. Pylori antibody testing was negative. She was started on pantoprazole and Hgb was 8.6 at the time of discharge. Since discharge she has noticed "just a [place]" blood tinged sputum and continued to have dark stools and a nose bleed. She has continued to notice intermittent epigastric abdominal pain. Her appetitie has not recovered completely. She denies lightheadedness, difficulty breathing, chest pain. She is feeling fatigued, more so than the usual.  Orthostatic vitals were checked in the clinic today, they were negative: laying down 119/44 (61) > sitting 124/45 (66) > standing 137/50 (63) and CBC was obtained and showed hemoglobin 9.9 which is improved from the time of her hospital discharge.  - discussed return precautions for symptomatic anemia and continued blood loss  - encouraged continuation of protonix  - clinic follow up scheduled in one week   See Encounters Tab for problem based charting.  Patient discussed with Dr. Lynnae January

## 2017-12-13 NOTE — Assessment & Plan Note (Signed)
Recently hospitalized for an acute GI bleed from gastric ulcers. Upper endoscopy revealed a erosive esophagitis, gastric ulcer, and gastric AVMs which treated. H. Pylori antibody testing was negative. She was started on pantoprazole and Hgb was 8.6 at the time of discharge. Since discharge she has noticed "just a [place]" blood tinged sputum and continued to have dark stools and a nose bleed. She has continued to notice intermittent epigastric abdominal pain. Her appetitie has not recovered completely. She denies lightheadedness, difficulty breathing, chest pain. She is feeling fatigued, more so than the usual.  Orthostatic vitals were checked in the clinic today, they were negative: laying down 119/44 (61) > sitting 124/45 (66) > standing 137/50 (63) and CBC was obtained and showed hemoglobin 9.9 which is improved from the time of her hospital discharge.  - discussed return precautions for symptomatic anemia and continued blood loss  - encouraged continuation of protonix  - clinic follow up scheduled in one week

## 2017-12-14 NOTE — Progress Notes (Signed)
Internal Medicine Clinic Attending  Case discussed with Dr. Blum at the time of the visit.  We reviewed the resident's history and exam and pertinent patient test results.  I agree with the assessment, diagnosis, and plan of care documented in the resident's note. 

## 2017-12-16 ENCOUNTER — Telehealth: Payer: Self-pay | Admitting: Cardiology

## 2017-12-16 NOTE — Telephone Encounter (Signed)
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 Pleas call she has some concerning what medication has been change if any, and to report patient lost 6.8 pounds in 8 days.

## 2017-12-16 NOTE — Telephone Encounter (Signed)
lpmtcb 11/7

## 2017-12-17 NOTE — Telephone Encounter (Signed)
Spoke to patient and her husband trying to clarify medications.  They were unclear with the Potassium prescription.  The patient has a vascular surgery appt on 11/14 at which time they will take all of the medications with them to the appt and further clarify.

## 2017-12-20 ENCOUNTER — Other Ambulatory Visit (INDEPENDENT_AMBULATORY_CARE_PROVIDER_SITE_OTHER): Payer: Medicare Other

## 2017-12-20 ENCOUNTER — Telehealth: Payer: Self-pay | Admitting: *Deleted

## 2017-12-20 DIAGNOSIS — D500 Iron deficiency anemia secondary to blood loss (chronic): Secondary | ICD-10-CM | POA: Diagnosis not present

## 2017-12-20 DIAGNOSIS — K31819 Angiodysplasia of stomach and duodenum without bleeding: Secondary | ICD-10-CM | POA: Diagnosis not present

## 2017-12-20 DIAGNOSIS — I780 Hereditary hemorrhagic telangiectasia: Secondary | ICD-10-CM

## 2017-12-20 LAB — CBC WITH DIFFERENTIAL/PLATELET
Abs Immature Granulocytes: 0.02 10*3/uL (ref 0.00–0.07)
BASOS PCT: 1 %
Basophils Absolute: 0 10*3/uL (ref 0.0–0.1)
EOS ABS: 0.4 10*3/uL (ref 0.0–0.5)
Eosinophils Relative: 7 %
HCT: 29.1 % — ABNORMAL LOW (ref 36.0–46.0)
Hemoglobin: 9 g/dL — ABNORMAL LOW (ref 12.0–15.0)
Immature Granulocytes: 0 %
Lymphocytes Relative: 11 %
Lymphs Abs: 0.6 10*3/uL — ABNORMAL LOW (ref 0.7–4.0)
MCH: 28.5 pg (ref 26.0–34.0)
MCHC: 30.9 g/dL (ref 30.0–36.0)
MCV: 92.1 fL (ref 80.0–100.0)
MONO ABS: 0.4 10*3/uL (ref 0.1–1.0)
Monocytes Relative: 8 %
NEUTROS ABS: 4 10*3/uL (ref 1.7–7.7)
NEUTROS PCT: 73 %
PLATELETS: 212 10*3/uL (ref 150–400)
RBC: 3.16 MIL/uL — AB (ref 3.87–5.11)
RDW: 16.2 % — AB (ref 11.5–15.5)
WBC: 5.4 10*3/uL (ref 4.0–10.5)
nRBC: 0 % (ref 0.0–0.2)

## 2017-12-20 LAB — FERRITIN: Ferritin: 13 ng/mL (ref 11–307)

## 2017-12-20 LAB — SAMPLE TO BLOOD BANK

## 2017-12-20 NOTE — Telephone Encounter (Signed)
Pt called / informed "pt blood count OK  (@ 9.0) no transfusion this week " per Dr [name].

## 2017-12-20 NOTE — Telephone Encounter (Signed)
-----   Message from [name], MD sent at 12/20/2017  1:39 PM EST ----- Call pt blood count OK no transfusion this week

## 2017-12-23 ENCOUNTER — Ambulatory Visit (HOSPITAL_COMMUNITY)
Admission: RE | Admit: 2017-12-23 | Discharge: 2017-12-23 | Disposition: A | Discharge status: Home / Self Care | Payer: Medicare Other | Source: Ambulatory Visit | Attending: Vascular Surgery | Admitting: Vascular Surgery

## 2017-12-23 ENCOUNTER — Other Ambulatory Visit: Payer: Self-pay

## 2017-12-23 ENCOUNTER — Encounter: Payer: Self-pay | Admitting: Vascular Surgery

## 2017-12-23 ENCOUNTER — Ambulatory Visit (INDEPENDENT_AMBULATORY_CARE_PROVIDER_SITE_OTHER): Payer: Medicare Other | Admitting: Vascular Surgery

## 2017-12-23 VITALS — BP 159/68 | HR 69 | Temp 98.6°F | Resp 20 | Ht 63.0 in | Wt 149.0 lb

## 2017-12-23 DIAGNOSIS — I739 Peripheral vascular disease, unspecified: Secondary | ICD-10-CM | POA: Insufficient documentation

## 2017-12-23 DIAGNOSIS — R29898 Other symptoms and signs involving the musculoskeletal system: Secondary | ICD-10-CM

## 2017-12-23 NOTE — Progress Notes (Signed)
Established Intermittent Claudication   History of Present Illness   [name] is a 77 y.o. ([birth year]) female who presents with chief complaint: bilateral leg weakness and abnormal ABIs.  She was seen last year by [facility] with similar symptoms.  She describes bilateral symmetrical leg weakness with prolonged standing as well as with walking about a half a block.  She denies any tightness in her calfs, rest pain, or any active tissue changes of bilateral lower extremities.  Symptoms have remained the same over the course of a year.  She is unable to tolerate blood thinners due to history of gastric AVMs and hospitalization for GI bleed.  She is a former smoker.    The patient's PMH, PSH, SH, and FamHx were reviewed and are unchanged from prior visit.  Current Outpatient Medications  Medication Sig Dispense Refill  . augmented betamethasone dipropionate (DIPROLENE-AF) 0.05 % cream Apply 1 application topically daily.  2  . b complex vitamins capsule Take 1 capsule by mouth daily. 100 capsule 2  . Blood Glucose Monitoring Suppl (ONETOUCH VERIO) w/Device KIT     . cetaphil (CETAPHIL) lotion Apply 1 application topically as needed for dry skin. 236 mL 0  . donepezil (ARICEPT) 10 MG tablet Take 1 tablet (10 mg total) by mouth at bedtime. 90 tablet 4  . Elastic Bandages & Supports (MEDICAL COMPRESSION STOCKINGS) MISC Wear as much as possible while awake to reduce swelling 2 each 0  . FERROCITE 324 MG TABS tablet TAKE 1 TABLET BY MOUTH TWICE DAILY 180 tablet 3  . furosemide (LASIX) 20 MG tablet TAKE 1 TABLET BY MOUTH ONCE DAILY (Patient taking differently: Take 20 mg by mouth daily. ) 30 tablet 9  . glucose blood (ONETOUCH VERIO) test strip Use as instructed 3-4 times daily. E11.29, insulin requiring 100 each 12  . glucose blood (ONETOUCH VERIO) test strip Check blood sugar 4 times a day 350 each 4  . insulin degludec (TRESIBA FLEXTOUCH) 100 UNIT/ML SOPN FlexTouch Pen Inject 0.22 mLs (22 Units  total) into the skin every morning. 6 mL 3  . Insulin Pen Needle (B-D UF III MINI PEN NEEDLES) 31G X 5 MM MISC USE TO INJECT INSULIN 3 TIMES DAILY 100 each 11  . lactulose (CHRONULAC) 10 GM/15ML solution TAKE 15 ML BY MOUTH TWO TIMES DAILY 473 mL 2  . levETIRAcetam (KEPPRA) 500 MG tablet Take 1 tablet (500 mg total) by mouth 2 (two) times daily. 180 tablet 4  . memantine (NAMENDA) 10 MG tablet Take 1 tablet (10 mg total) by mouth 2 (two) times daily. 180 tablet 4  . ONETOUCH DELICA LANCETS FINE MISC Check blood sugar up to 4 times a day 300 each 4  . oxymetazoline (AFRIN) 0.05 % nasal spray Place 1 spray into both nostrils 2 (two) times daily as needed (Epistaxis). 30 mL 0  . pantoprazole (PROTONIX) 40 MG tablet Take 1 tablet (40 mg total) by mouth daily. 60 tablet 0  . potassium chloride (K-DUR) 10 MEQ tablet Take 1 tablet (10 mEq total) by mouth daily. 30 tablet 11  . potassium chloride SA (KLOR-CON M20) 20 MEQ tablet TAKE 1 TABLET BY MOUTH ONCE DAILY 30 tablet 5  . pramipexole (MIRAPEX) 0.125 MG tablet TAKE 1 TABLET BY MOUTH ONCE DAILY 90 tablet 2  . Semaglutide,0.25 or 0.5MG/DOS, (OZEMPIC, 0.25 OR 0.5 MG/DOSE,) 2 MG/1.5ML SOPN Inject 0.5 mg into the skin once a week. 1.5 mL 3  . sodium chloride (OCEAN) 0.65 % SOLN nasal  spray Place 1 spray into both nostrils as needed for congestion. 1 Bottle 5  . triamcinolone ointment (KENALOG) 0.5 % APPLY 1 APPLICATION TOPICALLY TO RASH TWICE DAILY FOR ITCHING (Patient taking differently: Apply 1 application topically 2 (two) times daily as needed (itching). ) 15 g 1  . calcium citrate-vitamin D (CITRACAL+D) 315-200 MG-UNIT tablet Take 1 tablet by mouth 2 (two) times daily. 100 tablet 0   No current facility-administered medications for this visit.     On ROS today: 10 system ROS is negative unless otherwise noted in HPI   Physical Examination   Vitals:   12/23/17 1534  BP: (!) 159/68  Pulse: 69  Resp: 20  Temp: 98.6 F (37 C)  TempSrc: Oral    SpO2: 100%  Weight: 149 lb (67.6 kg)  Height: _0  (1.6 m)   Body mass index is 26.39 kg/m.  General Alert, O x 3, WD, NAD  Pulmonary Sym exp, good B air movt, CTA B  Cardiac RRR, Nl S1, S2, Murmur present: 4 out of 6 systolic murmur, No rubs, No S3,S4  Vascular Vessel Right Left  Radial Palpable Palpable  Brachial Palpable Palpable  Carotid Palpable, No Bruit Palpable, No Bruit  Aorta Not palpable N/A  Femoral Palpable Palpable  Popliteal Not palpable Not palpable  PT Not palpable Not palpable  DP Not palpable Not palpable    Gastro- intestinal soft, non-distended, non-tender to palpation,   Musculo- skeletal M/S 5/5 throughout  , Extremities without ischemic changes  , No edema present, No visible varicosities , No Lipodermatosclerosis present; lichen planus rash BLE  Neurologic Pain and light touch intact in extremities , Motor exam as listed above    Non-Invasive Vascular imaging   ABI (12/23/17)  R:   ABI: 0.73,   PT: mono  DP: mono  TBI:  0.72  L:   ABI: 0.65,   PT: mono  DP: mono  TBI: 0.69   Medical Decision Making   [name] is a 77 y.o. female who presents with abnormal ABIs   Chief complaint of leg weakness with standing and walking is likely multifactorial  Despite abnormal ABIs patient does not have classic symptoms of claudication, rest pain, or any active tissue ischemia  No indication for revascularization of bilateral lower extremities at this time  A walking program was discussed with the patient and encouraged  We will recheck ABIs in 1 year  She knows to return sooner if tissue changes or rest pain develops   Dagoberto Ligas PA-C [facility] Office: [phone]

## 2017-12-27 ENCOUNTER — Other Ambulatory Visit (INDEPENDENT_AMBULATORY_CARE_PROVIDER_SITE_OTHER): Payer: Medicare Other

## 2017-12-27 ENCOUNTER — Other Ambulatory Visit: Payer: Self-pay | Admitting: Oncology

## 2017-12-27 ENCOUNTER — Telehealth: Payer: Self-pay | Admitting: *Deleted

## 2017-12-27 ENCOUNTER — Other Ambulatory Visit: Payer: Self-pay | Admitting: *Deleted

## 2017-12-27 DIAGNOSIS — I780 Hereditary hemorrhagic telangiectasia: Secondary | ICD-10-CM

## 2017-12-27 DIAGNOSIS — E1121 Type 2 diabetes mellitus with diabetic nephropathy: Secondary | ICD-10-CM

## 2017-12-27 DIAGNOSIS — K31819 Angiodysplasia of stomach and duodenum without bleeding: Secondary | ICD-10-CM

## 2017-12-27 DIAGNOSIS — IMO0002 Reserved for concepts with insufficient information to code with codable children: Secondary | ICD-10-CM

## 2017-12-27 DIAGNOSIS — D500 Iron deficiency anemia secondary to blood loss (chronic): Secondary | ICD-10-CM

## 2017-12-27 DIAGNOSIS — E1165 Type 2 diabetes mellitus with hyperglycemia: Secondary | ICD-10-CM

## 2017-12-27 DIAGNOSIS — Z794 Long term (current) use of insulin: Secondary | ICD-10-CM

## 2017-12-27 LAB — CBC WITH DIFFERENTIAL/PLATELET
ABS IMMATURE GRANULOCYTES: 0.01 10*3/uL (ref 0.00–0.07)
BASOS ABS: 0 10*3/uL (ref 0.0–0.1)
BASOS PCT: 1 %
Eosinophils Absolute: 0.4 10*3/uL (ref 0.0–0.5)
Eosinophils Relative: 7 %
HCT: 27.4 % — ABNORMAL LOW (ref 36.0–46.0)
HEMOGLOBIN: 8.1 g/dL — AB (ref 12.0–15.0)
IMMATURE GRANULOCYTES: 0 %
LYMPHS PCT: 11 %
Lymphs Abs: 0.6 10*3/uL — ABNORMAL LOW (ref 0.7–4.0)
MCH: 27.8 pg (ref 26.0–34.0)
MCHC: 29.6 g/dL — ABNORMAL LOW (ref 30.0–36.0)
MCV: 94.2 fL (ref 80.0–100.0)
Monocytes Absolute: 0.4 10*3/uL (ref 0.1–1.0)
Monocytes Relative: 8 %
NEUTROS ABS: 4 10*3/uL (ref 1.7–7.7)
NEUTROS PCT: 73 %
PLATELETS: 215 10*3/uL (ref 150–400)
RBC: 2.91 MIL/uL — AB (ref 3.87–5.11)
RDW: 16.1 % — ABNORMAL HIGH (ref 11.5–15.5)
WBC: 5.4 10*3/uL (ref 4.0–10.5)
nRBC: 0 % (ref 0.0–0.2)

## 2017-12-27 LAB — SAMPLE TO BLOOD BANK

## 2017-12-27 NOTE — Telephone Encounter (Signed)
Call from Dr [name] - stated Hgb is down to 8.1 ; she will need blood transfusion.  Alden Server, [place] short stay - only available appt this week is Friday 11/22 @ 0800 AM.  Hulen Skains pt - talked to Mr Finks - informed pt's Hgb is 8.1 and need for transfusion. Informed of appt this Friday @ 0800 AM - stated this is ok and he will inform pt.

## 2017-12-27 NOTE — Telephone Encounter (Signed)
I had informed [name] about needing new type & cross when I scheduled the appt.

## 2017-12-27 NOTE — Telephone Encounter (Signed)
Thanks. Will need to repeat type & cross

## 2017-12-29 MED ORDER — INSULIN DEGLUDEC 100 UNIT/ML ~~LOC~~ SOPN: 22.0000 [IU] | PEN_INJECTOR | SUBCUTANEOUS | 1 refills | Status: DC

## 2017-12-29 MED ORDER — INSULIN PEN NEEDLE 31G X 5 MM MISC: 1 refills | Status: DC

## 2017-12-30 ENCOUNTER — Other Ambulatory Visit (HOSPITAL_COMMUNITY): Payer: Self-pay | Admitting: *Deleted

## 2017-12-31 ENCOUNTER — Ambulatory Visit (HOSPITAL_COMMUNITY)
Admission: RE | Admit: 2017-12-31 | Discharge: 2017-12-31 | Disposition: A | Discharge status: Home / Self Care | Payer: Medicare Other | Source: Ambulatory Visit | Attending: Oncology | Admitting: Oncology

## 2017-12-31 DIAGNOSIS — K31819 Angiodysplasia of stomach and duodenum without bleeding: Secondary | ICD-10-CM | POA: Diagnosis present

## 2017-12-31 DIAGNOSIS — I780 Hereditary hemorrhagic telangiectasia: Secondary | ICD-10-CM | POA: Diagnosis present

## 2017-12-31 DIAGNOSIS — D500 Iron deficiency anemia secondary to blood loss (chronic): Secondary | ICD-10-CM | POA: Diagnosis present

## 2017-12-31 LAB — PREPARE RBC (CROSSMATCH)

## 2017-12-31 MED ORDER — HEPARIN SOD (PORK) LOCK FLUSH 100 UNIT/ML IV SOLN
INTRAVENOUS | Status: AC
  Administered 2017-12-31: 500 [IU]
  Filled 2017-12-31: qty 5

## 2017-12-31 MED ORDER — SODIUM CHLORIDE 0.9% IV SOLUTION: Freq: Once | INTRAVENOUS | Status: DC

## 2018-01-01 LAB — TYPE AND SCREEN
ABO/RH(D): A POS
Antibody Screen: NEGATIVE
UNIT DIVISION: 0
UNIT DIVISION: 0

## 2018-01-01 LAB — BPAM RBC
Blood Product Expiration Date: 201911292359
Blood Product Expiration Date: 201911302359
ISSUE DATE / TIME: 201911220940
ISSUE DATE / TIME: 201911221135
UNIT TYPE AND RH: 6200
Unit Type and Rh: 6200

## 2018-01-03 ENCOUNTER — Telehealth: Payer: Self-pay | Admitting: *Deleted

## 2018-01-03 ENCOUNTER — Other Ambulatory Visit (INDEPENDENT_AMBULATORY_CARE_PROVIDER_SITE_OTHER): Payer: Medicare Other

## 2018-01-03 DIAGNOSIS — D500 Iron deficiency anemia secondary to blood loss (chronic): Secondary | ICD-10-CM

## 2018-01-03 DIAGNOSIS — I780 Hereditary hemorrhagic telangiectasia: Secondary | ICD-10-CM

## 2018-01-03 DIAGNOSIS — K31819 Angiodysplasia of stomach and duodenum without bleeding: Secondary | ICD-10-CM

## 2018-01-03 LAB — CBC WITH DIFFERENTIAL/PLATELET
Abs Immature Granulocytes: 0.02 10*3/uL (ref 0.00–0.07)
BASOS ABS: 0 10*3/uL (ref 0.0–0.1)
Basophils Relative: 0 %
EOS PCT: 6 %
Eosinophils Absolute: 0.3 10*3/uL (ref 0.0–0.5)
HCT: 32.7 % — ABNORMAL LOW (ref 36.0–46.0)
HEMOGLOBIN: 9.9 g/dL — AB (ref 12.0–15.0)
IMMATURE GRANULOCYTES: 0 %
LYMPHS ABS: 0.5 10*3/uL — AB (ref 0.7–4.0)
LYMPHS PCT: 9 %
MCH: 28.6 pg (ref 26.0–34.0)
MCHC: 30.3 g/dL (ref 30.0–36.0)
MCV: 94.5 fL (ref 80.0–100.0)
Monocytes Absolute: 0.5 10*3/uL (ref 0.1–1.0)
Monocytes Relative: 8 %
NEUTROS PCT: 77 %
NRBC: 0 % (ref 0.0–0.2)
Neutro Abs: 4.4 10*3/uL (ref 1.7–7.7)
Platelets: 166 10*3/uL (ref 150–400)
RBC: 3.46 MIL/uL — AB (ref 3.87–5.11)
RDW: 16.2 % — AB (ref 11.5–15.5)
WBC: 5.7 10*3/uL (ref 4.0–10.5)

## 2018-01-03 LAB — SAMPLE TO BLOOD BANK

## 2018-01-03 NOTE — Telephone Encounter (Signed)
-----   Message from [name], MD sent at 01/03/2018 11:56 AM EST ----- Call pt: blood count OK. No transfusion this week. Happy [id number]!

## 2018-01-03 NOTE — Telephone Encounter (Signed)
Pt called / informed "blood count OK. No transfusion this week. Happy [id number]! " per Dr [name]. She said thank-you and have a Happy Thanksgiving to Dr Darnell Level.

## 2018-01-10 ENCOUNTER — Telehealth: Payer: Self-pay | Admitting: *Deleted

## 2018-01-10 ENCOUNTER — Other Ambulatory Visit (INDEPENDENT_AMBULATORY_CARE_PROVIDER_SITE_OTHER): Payer: Medicare Other

## 2018-01-10 DIAGNOSIS — I780 Hereditary hemorrhagic telangiectasia: Secondary | ICD-10-CM

## 2018-01-10 DIAGNOSIS — K31819 Angiodysplasia of stomach and duodenum without bleeding: Secondary | ICD-10-CM | POA: Diagnosis not present

## 2018-01-10 DIAGNOSIS — D500 Iron deficiency anemia secondary to blood loss (chronic): Secondary | ICD-10-CM

## 2018-01-10 LAB — CBC WITH DIFFERENTIAL/PLATELET
Abs Immature Granulocytes: 0.01 10*3/uL (ref 0.00–0.07)
Basophils Absolute: 0 10*3/uL (ref 0.0–0.1)
Basophils Relative: 0 %
Eosinophils Absolute: 0.3 10*3/uL (ref 0.0–0.5)
Eosinophils Relative: 6 %
HEMATOCRIT: 31.4 % — AB (ref 36.0–46.0)
HEMOGLOBIN: 9.7 g/dL — AB (ref 12.0–15.0)
Immature Granulocytes: 0 %
LYMPHS ABS: 0.5 10*3/uL — AB (ref 0.7–4.0)
LYMPHS PCT: 10 %
MCH: 29 pg (ref 26.0–34.0)
MCHC: 30.9 g/dL (ref 30.0–36.0)
MCV: 94 fL (ref 80.0–100.0)
MONO ABS: 0.4 10*3/uL (ref 0.1–1.0)
Monocytes Relative: 8 %
Neutro Abs: 3.6 10*3/uL (ref 1.7–7.7)
Neutrophils Relative %: 76 %
Platelets: 160 10*3/uL (ref 150–400)
RBC: 3.34 MIL/uL — ABNORMAL LOW (ref 3.87–5.11)
RDW: 15.5 % (ref 11.5–15.5)
WBC: 4.7 10*3/uL (ref 4.0–10.5)
nRBC: 0 % (ref 0.0–0.2)

## 2018-01-10 LAB — SAMPLE TO BLOOD BANK

## 2018-01-10 NOTE — Telephone Encounter (Signed)
Spoke with pt's husband-no blood transfusion needed this week.  Per husband, pt scheduled to have labs drawn again next Monday.Despina Hidden Cassady12/2/20194:56 PM

## 2018-01-10 NOTE — Telephone Encounter (Signed)
-----   Message from [name], MD sent at 01/10/2018  1:04 PM EST ----- Call pt: blood count god; no transfusion this week

## 2018-01-17 ENCOUNTER — Telehealth: Payer: Self-pay | Admitting: *Deleted

## 2018-01-17 ENCOUNTER — Other Ambulatory Visit: Payer: Self-pay | Admitting: Internal Medicine

## 2018-01-17 ENCOUNTER — Other Ambulatory Visit (INDEPENDENT_AMBULATORY_CARE_PROVIDER_SITE_OTHER): Payer: Medicare Other

## 2018-01-17 DIAGNOSIS — D500 Iron deficiency anemia secondary to blood loss (chronic): Secondary | ICD-10-CM

## 2018-01-17 DIAGNOSIS — I780 Hereditary hemorrhagic telangiectasia: Secondary | ICD-10-CM | POA: Diagnosis not present

## 2018-01-17 DIAGNOSIS — E1165 Type 2 diabetes mellitus with hyperglycemia: Secondary | ICD-10-CM

## 2018-01-17 DIAGNOSIS — K31819 Angiodysplasia of stomach and duodenum without bleeding: Secondary | ICD-10-CM | POA: Diagnosis not present

## 2018-01-17 DIAGNOSIS — Z794 Long term (current) use of insulin: Principal | ICD-10-CM

## 2018-01-17 LAB — CBC WITH DIFFERENTIAL/PLATELET
ABS IMMATURE GRANULOCYTES: 0.04 10*3/uL (ref 0.00–0.07)
BASOS PCT: 0 %
Basophils Absolute: 0 10*3/uL (ref 0.0–0.1)
Eosinophils Absolute: 0.3 10*3/uL (ref 0.0–0.5)
Eosinophils Relative: 5 %
HCT: 28 % — ABNORMAL LOW (ref 36.0–46.0)
Hemoglobin: 8.5 g/dL — ABNORMAL LOW (ref 12.0–15.0)
Immature Granulocytes: 1 %
Lymphocytes Relative: 9 %
Lymphs Abs: 0.5 10*3/uL — ABNORMAL LOW (ref 0.7–4.0)
MCH: 29 pg (ref 26.0–34.0)
MCHC: 30.4 g/dL (ref 30.0–36.0)
MCV: 95.6 fL (ref 80.0–100.0)
MONO ABS: 0.4 10*3/uL (ref 0.1–1.0)
Monocytes Relative: 8 %
NEUTROS ABS: 4.1 10*3/uL (ref 1.7–7.7)
Neutrophils Relative %: 77 %
PLATELETS: 199 10*3/uL (ref 150–400)
RBC: 2.93 MIL/uL — AB (ref 3.87–5.11)
RDW: 15 % (ref 11.5–15.5)
WBC: 5.3 10*3/uL (ref 4.0–10.5)
nRBC: 0 % (ref 0.0–0.2)

## 2018-01-17 LAB — SAMPLE TO BLOOD BANK

## 2018-01-17 LAB — FERRITIN: Ferritin: 10 ng/mL — ABNORMAL LOW (ref 11–307)

## 2018-01-17 NOTE — Telephone Encounter (Signed)
Hi Dr. Maudie Mercury, Let's try a reduced dose first, and if she is still experiencing side effects we can go back to insulin.   Thanks!

## 2018-01-17 NOTE — Telephone Encounter (Signed)
Pt presents c/o semaglutide making her very nauseous and making her feel very bad. She states she just cant take it anymore. Please advise.

## 2018-01-17 NOTE — Telephone Encounter (Signed)
Hi Dr. Koleen Distance, we started semaglutide due to glycemic variability identified in June as well as heart disease. Would you like me to work with patient to reduce the dose of semaglutide or switch back to insulin? Please advise.  Thank you

## 2018-01-18 ENCOUNTER — Telehealth: Payer: Self-pay | Admitting: *Deleted

## 2018-01-18 NOTE — Telephone Encounter (Signed)
-----   Message from Oda Kilts, MD sent at 01/18/2018  9:52 AM EST ----- Dr. Darnell Level is still out. Hemoglobin is down a [place] bit, but as long as she's feeling ok, no need to transfuse at this time.   ----- Message ----- From: Ebbie Latus, RN Sent: 01/18/2018   8:48 AM EST To: [name], MD  Dr Darnell Level  Mrs Belter's hgb is 8.5 Thanks Holley Raring

## 2018-01-18 NOTE — Telephone Encounter (Signed)
Called pt - talked to Mr Klutts, informed her Hgb is 8.5, Dr Darnell Level is out of town but one of the doctors looked at it and no transfusion. Voiced understanding. Stated pt is feeling ok.

## 2018-01-21 ENCOUNTER — Other Ambulatory Visit: Payer: Self-pay | Admitting: Oncology

## 2018-01-21 DIAGNOSIS — I780 Hereditary hemorrhagic telangiectasia: Secondary | ICD-10-CM

## 2018-01-21 DIAGNOSIS — K31819 Angiodysplasia of stomach and duodenum without bleeding: Secondary | ICD-10-CM

## 2018-01-21 DIAGNOSIS — D500 Iron deficiency anemia secondary to blood loss (chronic): Secondary | ICD-10-CM

## 2018-01-21 MED ORDER — SEMAGLUTIDE(0.25 OR 0.5MG/DOS) 2 MG/1.5ML ~~LOC~~ SOPN: 0.2500 mg | PEN_INJECTOR | SUBCUTANEOUS | 3 refills | Status: DC

## 2018-01-21 NOTE — Telephone Encounter (Signed)
Spoke to patient about reducing dose of semaglutide and she agrees. New prescription sent for semaglutide 0.25 mg once weekly. I also reviewed home BG monitoring with patient. She states she checks her BG sometimes within 1-2 hours after eating a meal. Fasting BG in the morning range from 80-140, but throughout the day, BG as high as 200s-300s. For simplification and improved accuracy, I advised her to check BG before meals. She verbalized understanding. I also advised patient to bring home BG meter to Monday appointments so we can download data and she agreed.

## 2018-01-24 ENCOUNTER — Ambulatory Visit (INDEPENDENT_AMBULATORY_CARE_PROVIDER_SITE_OTHER): Payer: Medicare Other | Admitting: Pharmacist

## 2018-01-24 ENCOUNTER — Other Ambulatory Visit: Payer: Self-pay | Admitting: Oncology

## 2018-01-24 ENCOUNTER — Telehealth: Payer: Self-pay | Admitting: *Deleted

## 2018-01-24 ENCOUNTER — Other Ambulatory Visit (INDEPENDENT_AMBULATORY_CARE_PROVIDER_SITE_OTHER): Payer: Medicare Other

## 2018-01-24 DIAGNOSIS — Z794 Long term (current) use of insulin: Secondary | ICD-10-CM | POA: Diagnosis not present

## 2018-01-24 DIAGNOSIS — D500 Iron deficiency anemia secondary to blood loss (chronic): Secondary | ICD-10-CM | POA: Diagnosis not present

## 2018-01-24 DIAGNOSIS — E1165 Type 2 diabetes mellitus with hyperglycemia: Secondary | ICD-10-CM

## 2018-01-24 DIAGNOSIS — K31819 Angiodysplasia of stomach and duodenum without bleeding: Secondary | ICD-10-CM | POA: Diagnosis not present

## 2018-01-24 DIAGNOSIS — E118 Type 2 diabetes mellitus with unspecified complications: Secondary | ICD-10-CM

## 2018-01-24 DIAGNOSIS — I780 Hereditary hemorrhagic telangiectasia: Secondary | ICD-10-CM

## 2018-01-24 LAB — SAMPLE TO BLOOD BANK

## 2018-01-24 LAB — CBC WITH DIFFERENTIAL/PLATELET
Abs Immature Granulocytes: 0.01 10*3/uL (ref 0.00–0.07)
Basophils Absolute: 0 10*3/uL (ref 0.0–0.1)
Basophils Relative: 0 %
EOS ABS: 0.2 10*3/uL (ref 0.0–0.5)
Eosinophils Relative: 5 %
HEMATOCRIT: 25.6 % — AB (ref 36.0–46.0)
Hemoglobin: 7.8 g/dL — ABNORMAL LOW (ref 12.0–15.0)
Immature Granulocytes: 0 %
LYMPHS ABS: 0.5 10*3/uL — AB (ref 0.7–4.0)
Lymphocytes Relative: 11 %
MCH: 28.4 pg (ref 26.0–34.0)
MCHC: 30.5 g/dL (ref 30.0–36.0)
MCV: 93.1 fL (ref 80.0–100.0)
MONO ABS: 0.3 10*3/uL (ref 0.1–1.0)
MONOS PCT: 8 %
Neutro Abs: 3.4 10*3/uL (ref 1.7–7.7)
Neutrophils Relative %: 76 %
PLATELETS: 181 10*3/uL (ref 150–400)
RBC: 2.75 MIL/uL — ABNORMAL LOW (ref 3.87–5.11)
RDW: 14.4 % (ref 11.5–15.5)
WBC: 4.5 10*3/uL (ref 4.0–10.5)
nRBC: 0 % (ref 0.0–0.2)

## 2018-01-24 NOTE — Telephone Encounter (Signed)
Pt called / informed Hbg 7.8; she will needs blood transfusion and IV iron per Dr [name]. Appt scheduled for Friday 12/20 @ 0800 AM at [facility] - pt informed.

## 2018-01-24 NOTE — Telephone Encounter (Signed)
-----   Message from [name], MD sent at 01/24/2018 12:45 PM EST ----- Please set her up for blood & IV iron - let me know what day so I know if we can use type & cross from today. DrG

## 2018-01-25 ENCOUNTER — Telehealth: Payer: Self-pay | Admitting: *Deleted

## 2018-01-25 NOTE — Telephone Encounter (Signed)
-----   Message from [name], MD sent at 01/25/2018  4:21 PM EST ----- Regarding: RE: Lab appt Let's have her come early on the 27th. Clinic will be closed following Tuesday & Wed so it will be hard to get her lined up for blood if she comes on the 30th. ----- Message ----- From: Ebbie Latus, RN Sent: 01/25/2018   3:15 PM EST To: [name], MD Subject: Lab appt                                       Dr Darnell Level Since Mrs Boan coming Friday(20th) for blood and iron, can she wait until the following Friday(27th) or the following Monday (30th) for next lab appt? She's usually every Monfday. Thanks [employer]

## 2018-01-25 NOTE — Patient Instructions (Signed)
Patient educated about medication as defined in this encounter and verbalized understanding by repeating back instructions provided.   

## 2018-01-25 NOTE — Telephone Encounter (Signed)
Called pt - talked to pt's husband. Informed next lab appt will next Friday the 27th instead of the 23rd since pt will be be receiving blood/iron this Friday. He voiced understanding.

## 2018-01-25 NOTE — Addendum Note (Signed)
Addended by: Forde Dandy on: 01/25/2018 09:58 AM   Modules accepted: Orders

## 2018-01-25 NOTE — Progress Notes (Addendum)
S: [name] is a 77 y.o. female reports to clinical pharmacist appointment for help with diabetes medication management per PCP  Allergies  Allergen Reactions  . Aspirin Nausea And Vomiting   Medication Sig  augmented betamethasone dipropionate (DIPROLENE-AF) 0.05 % cream Apply 1 application topically daily.  b complex vitamins capsule Take 1 capsule by mouth daily.  Blood Glucose Monitoring Suppl (ONETOUCH VERIO) w/Device KIT   calcium citrate-vitamin D (CITRACAL+D) 315-200 MG-UNIT tablet Take 1 tablet by mouth 2 (two) times daily.  cetaphil (CETAPHIL) lotion Apply 1 application topically as needed for dry skin.  donepezil (ARICEPT) 10 MG tablet Take 1 tablet (10 mg total) by mouth at bedtime.  Elastic Bandages & Supports (MEDICAL COMPRESSION STOCKINGS) MISC Wear as much as possible while awake to reduce swelling  FERROCITE 324 MG TABS tablet TAKE 1 TABLET BY MOUTH TWICE DAILY  furosemide (LASIX) 20 MG tablet TAKE 1 TABLET BY MOUTH ONCE DAILY Patient taking differently: Take 20 mg by mouth daily.   glucose blood (ONETOUCH VERIO) test strip Use as instructed 3-4 times daily. E11.29, insulin requiring  insulin degludec (TRESIBA FLEXTOUCH) 100 UNIT/ML SOPN FlexTouch Pen Inject 0.22 mLs (22 Units total) into the skin every morning.  Insulin Pen Needle (B-D UF III MINI PEN NEEDLES) 31G X 5 MM MISC USE TO INJECT INSULIN 3 TIMES DAILY  lactulose (CHRONULAC) 10 GM/15ML solution TAKE 15 ML BY MOUTH TWO TIMES DAILY  levETIRAcetam (KEPPRA) 500 MG tablet Take 1 tablet (500 mg total) by mouth 2 (two) times daily.  memantine (NAMENDA) 10 MG tablet Take 1 tablet (10 mg total) by mouth 2 (two) times daily.  ONETOUCH DELICA LANCETS FINE MISC Check blood sugar up to 4 times a day  oxymetazoline (AFRIN) 0.05 % nasal spray Place 1 spray into both nostrils 2 (two) times daily as needed (Epistaxis).  pantoprazole (PROTONIX) 40 MG tablet TAKE 1 TABLET BY MOUTH ONCE DAILY  potassium chloride (K-DUR) 10 MEQ  tablet Take 1 tablet (10 mEq total) by mouth daily.  pramipexole (MIRAPEX) 0.125 MG tablet TAKE 1 TABLET BY MOUTH ONCE DAILY  Semaglutide,0.25 or 0.5MG/DOS, (OZEMPIC, 0.25 OR 0.5 MG/DOSE,) 2 MG/1.5ML SOPN Inject 0.25 mg into the skin once a week.  sodium chloride (OCEAN) 0.65 % SOLN nasal spray Place 1 spray into both nostrils as needed for congestion.  triamcinolone ointment (KENALOG) 0.5 % APPLY 1 APPLICATION TOPICALLY TO RASH TWICE DAILY FOR ITCHING Patient taking differently: Apply 1 application topically 2 (two) times daily as needed (itching).    Past Medical History:  Diagnosis Date  . Chronic anemia   . Chronic diastolic CHF (congestive heart failure) ([place]) 10/03/2013  . Chronic GI bleeding    [name] 11/29/2014  . Family history of anesthesia complication    "niece has a hard time coming out" (09/15/2012)  . Frequent nosebleeds    chronic  . Gastric AV malformation    [name] 11/29/2014  . GERD (gastroesophageal reflux disease)   . 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 History of blood transfusion "several"  . History of epistaxis   . HTN (hypertension), benign 03/02/2012  . Hyperlipidemia   . Iron deficiency anemia    chronic infusions"  . Lichen planus    Both lower extremities  . 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 Overgrown toenails 03/18/2017  . Pneumonia 1990's X 2  . Pulmonary HTN ([place]) 04/01/2017   PASP 27mHg on echo 09/2016  . [place]  .  Symptomatic anemia 11/29/2014  . Telangiectasia    Gastric   . 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   Social History   Socioeconomic History  . Marital status: Married    Spouse name: [name]  . Number of children: 0  . Years of education: 9  . Highest education level: Not on file  Occupational History  . Occupation: Retired Systems developer: RETIRED  Social Needs  . Financial resource strain: Not on file  . Food insecurity:    Worry: Not on  file    Inability: Not on file  . Transportation needs:    Medical: Not on file    Non-medical: Not on file  Tobacco Use  . Smoking status: Former Smoker    Packs/day: 1.00    Years: 20.00    Pack years: 20.00    Types: Cigarettes    Last attempt to quit: 02/10/1971    Years since quitting: 46.9  . Smokeless tobacco: Never Used  . Tobacco comment: 09/15/2012 "smoked 50-60 yr ago"  Substance and Sexual Activity  . Alcohol use: No    Alcohol/week: 0.0 standard drinks  . Drug use: No  . Sexual activity: Not on file  Lifestyle  . Physical activity:    Days per week: Not on file    Minutes per session: Not on file  . Stress: Not on file  Relationships  . Social connections:    Talks on phone: Not on file    Gets together: Not on file    Attends religious service: Not on file    Active member of club or organization: Not on file    Attends meetings of clubs or organizations: Not on file    Relationship status: Not on file  Other Topics Concern  . Not on file  Social History Narrative   Married, lives with husband, [name].  Ambulates without assistance.     Caffeine use: none   Family History  Problem Relation Age of Onset  . Stroke Father   . Breast cancer Unknown   . Malignant hyperthermia Neg Hx   . Seizures Neg Hx    O: Component Value Date/Time   CHOL 141 11/29/2014 1026   HDL 31 (L) 11/29/2014 1026   LDLCALC 82 11/29/2014 1026   TRIG 141 11/29/2014 1026   GLUCOSE 75 12/05/2017 0250   GLUCOSE 156 (H) 04/04/2014 1019   GLUCOSE 152 (H) 12/28/2011 0825   HGBA1C 5.9 (A) 07/15/2017 1127   HGBA1C 7.3 (H) 09/05/2014 0720   NA 142 12/05/2017 0250   NA 145 (H) 11/18/2017 1408   NA 144 04/04/2014 1019   K 3.8 12/05/2017 0250   K 4.5 04/04/2014 1019   CL 110 12/05/2017 0250   CL 111 (H) 12/28/2011 0825   CO2 29 12/05/2017 0250   CO2 26 04/04/2014 1019   BUN 33 (H) 12/05/2017 0250   BUN 20 11/18/2017 1408   BUN 16.9 04/04/2014 1019   CREATININE 1.51 (H) 12/05/2017  0250   CREATININE 0.92 01/28/2015 0811   CREATININE 0.9 04/04/2014 1019   CALCIUM 8.8 (L) 12/05/2017 0250   CALCIUM 9.3 04/04/2014 1019   GFRNONAA 32 (L) 12/05/2017 0250   GFRAA 37 (L) 12/05/2017 0250   AST 24 12/04/2017 1230   AST 30 04/04/2014 1019   ALT 17 12/04/2017 1230   ALT 25 04/04/2014 1019   WBC 4.5 01/24/2018 1123   HGB 7.8 (L) 01/24/2018 1123   HGB 9.7 (  L) 03/13/2015 1059   HCT 25.6 (L) 01/24/2018 1123   HCT 29.5 (L) 03/13/2015 1059   PLT 181 01/24/2018 1123   PLT 140 (L) 03/13/2015 1059   PLT 237 11/09/2014 1547   TSH 1.730 01/30/2015 1216   Ht Readings from Last 2 Encounters:  12/31/17 _0  (1.6 m)  12/23/17 _1  (1.6 m)   Wt Readings from Last 2 Encounters:  12/31/17 145 lb (65.8 kg)  12/23/17 149 lb (67.6 kg)   There is no height or weight on file to calculate BMI. BP Readings from Last 3 Encounters:  12/31/17 140/65  12/23/17 (!) 159/68  12/13/17 (!) 117/42    A/P: Patient is currently taking insulin degludec 22 units/day and semaglutide 0.25 mg weekly. She wishes to discontinue semaglutide due to GI side effects after trying months of GLP-1 agonist therapy. She also experienced intolerable GI side effects with liraglutide. I explained the benefits of GLP-1 agonist therapy, however, patient still adamant about discontinuing.  Other than GI symptoms (severe nausea, bloating), no concerns reported today. Home BG meter was downloaded and indicated BG average of 217 (correlates with an A1C of 9.2), lowest BG 80 mg/dL (fasting AM), highest 368 (highest results are before dinner). Changes made today: Stop semaglutide Increase insulin degludec to 24 units daily Initiate linagliptin 5 mg daily (samples and patient education provided)  An after visit summary was provided and patient advised to follow up in 1 week or sooner if any changes in condition or questions regarding medications arise.   The patient verbalized understanding of information provided by  repeating back concepts discussed.   30 minutes spent face-to-face with the patient during the encounter. 50% of time spent on education. 50% of time was spent on assessment, plan, and coordination of care.

## 2018-01-26 ENCOUNTER — Encounter: Payer: Self-pay | Admitting: Physician Assistant

## 2018-01-26 ENCOUNTER — Ambulatory Visit (INDEPENDENT_AMBULATORY_CARE_PROVIDER_SITE_OTHER): Payer: Medicare Other | Admitting: Physician Assistant

## 2018-01-26 VITALS — BP 120/60 | HR 67 | Ht 63.0 in | Wt 152.1 lb

## 2018-01-26 DIAGNOSIS — I5032 Chronic diastolic (congestive) heart failure: Secondary | ICD-10-CM | POA: Diagnosis not present

## 2018-01-26 DIAGNOSIS — I272 Pulmonary hypertension, unspecified: Secondary | ICD-10-CM | POA: Diagnosis not present

## 2018-01-26 NOTE — Patient Instructions (Signed)
Medication Instructions:  Your physician recommends that you continue on your current medications as directed. Please refer to the Current Medication list given to you today.  If you need a refill on your cardiac medications before your next appointment, please call your pharmacy.   Lab work: None Ordered  If you have labs (blood work) drawn today and your tests are completely normal, you will receive your results only by: Marland Kitchen MyChart Message (if you have MyChart) OR . A paper copy in the mail If you have any lab test that is abnormal or we need to change your treatment, we will call you to review the results.  Testing/Procedures: None ordered  Follow-Up: At [facility], you and your health needs are our priority.  As part of our continuing mission to provide you with exceptional heart care, we have created designated Provider Care Teams.  These Care Teams include your primary Cardiologist (physician) and Advanced Practice Providers (APPs -  Physician Assistants and Nurse Practitioners) who all work together to provide you with the care you need, when you need it. . You will need a follow up appointment in 6 months.  Please call our office 2 months in advance to schedule this appointment.  You may see Fransico Him, MD or one of the following Advanced Practice Providers on your designated Care Team:   . Lyda Jester, PA-C . Dayna Dunn, PA-C . Ermalinda Barrios, PA-C  Any Other Special Instructions Will Be Listed Below (If Applicable).

## 2018-01-26 NOTE — Progress Notes (Signed)
Cardiology Office Note    Date:  01/26/2018   ID:  [name], DOB [birth year], MRN [id number]  PCP:  Delice Bison, DO  Cardiologist: Fransico Him, MD EPS: None  Chief Complaint  Patient presents with  . Follow-up    History of Present Illness:  [name] is a 77 y.o. female with history of Osler-Weber-Rendu's disease, gastric AVMs, chronic iron deficiency anemia requiring frequent transfusions, hypertension, chronic diastolic CHF, HLD, GERD, type 2 diabetes mellitus.  2D echo in 2018 normal LVEF with mild LVH and grade 2 DD moderate pulmonary hypertension PASP of 57 mmHg.  Patient last saw Dr. Radford Pax 04/01/2017 at which time she was doing well.  2D echo 04/07/2017 showed normal/vigorous LV systolic function, grade 2 DD and moderate pulmonary hypertension but improvement in pulmonary hypertension.  Patient comes in for routine f/u. Walks 30 min a day for PVD. She does get tired and short of breath but overall doing well.     Past Medical History:  Diagnosis Date  . Chronic anemia   . Chronic diastolic CHF (congestive heart failure) ([place]) 10/03/2013  . Chronic GI bleeding    [name] 11/29/2014  . Family history of anesthesia complication    "niece has a hard time coming out" (09/15/2012)  . Frequent nosebleeds    chronic  . Gastric AV malformation    [name] 11/29/2014  . GERD (gastroesophageal reflux disease)   . 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 History of blood transfusion "several"  . History of epistaxis   . HTN (hypertension), benign 03/02/2012  . Hyperlipidemia   . Iron deficiency anemia    chronic infusions"  . Lichen planus    Both lower extremities  . 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[place])    [name] 11/29/2014  . Overgrown toenails 03/18/2017  . Pneumonia 1990's X 2  . Pulmonary HTN ([place]) 04/01/2017   PASP 67mHg on echo 09/2016  . [place]  . Symptomatic anemia 11/29/2014  . Telangiectasia    Gastric   . Type II  diabetes mellitus (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    Past Surgical History:  Procedure Laterality Date  . CATARACT EXTRACTION     "I think it was just one eye"  . ESOPHAGOGASTRODUODENOSCOPY  02/26/2011   Procedure: ESOPHAGOGASTRODUODENOSCOPY (EGD);  Surgeon: JMissy Sabins MD;  [facility];  Service: Endoscopy;  Laterality: N/A;  . ESOPHAGOGASTRODUODENOSCOPY N/A 11/08/2012   Procedure: ESOPHAGOGASTRODUODENOSCOPY (EGD);  Surgeon: PBeryle Beams MD;  [facility];  Service: Endoscopy;  Laterality: N/A;  . ESOPHAGOGASTRODUODENOSCOPY N/A 10/04/2013   Procedure: ESOPHAGOGASTRODUODENOSCOPY (EGD);  Surgeon: JWinfield Cunas, MD;  [facility];  Service: Endoscopy;  Laterality: N/A;  with APC on stand-by  . ESOPHAGOGASTRODUODENOSCOPY N/A 07/06/2014   Procedure: ESOPHAGOGASTRODUODENOSCOPY (EGD);  Surgeon: MClarene Essex MD;  [facility];  Service: Endoscopy;  Laterality: N/A;  . ESOPHAGOGASTRODUODENOSCOPY N/A 09/05/2014   Procedure: ESOPHAGOGASTRODUODENOSCOPY (EGD);  Surgeon: JLaurence Spates MD;  [facility];  Service: Endoscopy;  Laterality: N/A;  APC on standby to control bleeding  . ESOPHAGOGASTRODUODENOSCOPY N/A 11/29/2014   Procedure: ESOPHAGOGASTRODUODENOSCOPY (EGD);  Surgeon: VWilford Corner MD;  [facility];  Service: Endoscopy;  Laterality: N/A;  . ESOPHAGOGASTRODUODENOSCOPY N/A 09/28/2015   Procedure: ESOPHAGOGASTRODUODENOSCOPY (EGD);  Surgeon: MClarene Essex MD;  [facility];  Service: Endoscopy;  Laterality: N/A;  . ESOPHAGOGASTRODUODENOSCOPY (EGD) WITH PROPOFOL N/A 12/04/2017   Procedure: ESOPHAGOGASTRODUODENOSCOPY (EGD) WITH PROPOFOL;  Surgeon: SWilford Corner  MD;  [facility];  Service: Endoscopy;  Laterality: N/A;  . ESOPHAGOGASTRODUODENOSCOPY ENDOSCOPY  08/19/2006   with laser treatment  . HOT HEMOSTASIS N/A 07/06/2014   Procedure: HOT HEMOSTASIS (ARGON PLASMA COAGULATION/BICAP);  Surgeon: Clarene Essex, MD;   [name];  Service: Endoscopy;  Laterality: N/A;  . HOT HEMOSTASIS N/A 09/28/2015   Procedure: HOT HEMOSTASIS (ARGON PLASMA COAGULATION/BICAP);  Surgeon: Clarene Essex, MD;  [facility];  Service: Endoscopy;  Laterality: N/A;  . HOT HEMOSTASIS N/A 12/04/2017   Procedure: HOT HEMOSTASIS (ARGON PLASMA COAGULATION/BICAP);  Surgeon: [name], MD;  Location: [place];  Service: Endoscopy;  Laterality: N/A;  . NASAL HEMORRHAGE CONTROL     "for bleeding"   . SAVORY DILATION  02/26/2011   Procedure: SAVORY DILATION;  Surgeon: [name], MD;  [name];  Service: Endoscopy;  Laterality: N/A;  c-arm needed  . SUBMUCOSAL INJECTION  12/04/2017   Procedure: SUBMUCOSAL INJECTION;  Surgeon: [name], MD;  [facility];  Service: Endoscopy;;    Current Medications: Current Meds  Medication Sig  . augmented betamethasone dipropionate (DIPROLENE-AF) 0.05 % cream Apply 1 application topically daily.  Marland Kitchen b complex vitamins capsule Take 1 capsule by mouth daily.  . Blood Glucose Monitoring Suppl (ONETOUCH VERIO) w/Device KIT   . cetaphil (CETAPHIL) lotion Apply 1 application topically as needed for dry skin.  Marland Kitchen donepezil (ARICEPT) 10 MG tablet Take 1 tablet (10 mg total) by mouth at bedtime.  Water engineer Bandages & Supports (MEDICAL COMPRESSION STOCKINGS) MISC Wear as much as possible while awake to reduce swelling  . FERROCITE 324 MG TABS tablet TAKE 1 TABLET BY MOUTH TWICE DAILY  . furosemide (LASIX) 20 MG tablet TAKE 1 TABLET BY MOUTH ONCE DAILY (Patient taking differently: Take 20 mg by mouth daily. )  . glucose blood (ONETOUCH VERIO) test strip Use as instructed 3-4 times daily. E11.29, insulin requiring  . insulin degludec (TRESIBA FLEXTOUCH) 100 UNIT/ML SOPN FlexTouch Pen Inject 0.22 mLs (22 Units total) into the skin every morning. (Patient taking differently: Inject 24 Units into the skin every morning. )  . Insulin Pen Needle (B-D UF III  MINI PEN NEEDLES) 31G X 5 MM MISC USE TO INJECT INSULIN 3 TIMES DAILY  . lactulose (CHRONULAC) 10 GM/15ML solution TAKE 15 ML BY MOUTH TWO TIMES DAILY  . levETIRAcetam (KEPPRA) 500 MG tablet Take 1 tablet (500 mg total) by mouth 2 (two) times daily.  Marland Kitchen linagliptin (TRADJENTA) 5 MG TABS tablet Take 5 mg by mouth daily.  . memantine (NAMENDA) 10 MG tablet Take 1 tablet (10 mg total) by mouth 2 (two) times daily.  Glory Rosebush DELICA LANCETS FINE MISC Check blood sugar up to 4 times a day  . oxymetazoline (AFRIN) 0.05 % nasal spray Place 1 spray into both nostrils 2 (two) times daily as needed (Epistaxis).  . pantoprazole (PROTONIX) 40 MG tablet TAKE 1 TABLET BY MOUTH ONCE DAILY  . potassium chloride (K-DUR) 10 MEQ tablet Take 1 tablet (10 mEq total) by mouth daily.  . pramipexole (MIRAPEX) 0.125 MG tablet TAKE 1 TABLET BY MOUTH ONCE DAILY  . sodium chloride (OCEAN) 0.65 % SOLN nasal spray Place 1 spray into both nostrils as needed for congestion.  . triamcinolone ointment (KENALOG) 0.5 % APPLY 1 APPLICATION TOPICALLY TO RASH TWICE DAILY FOR ITCHING (Patient taking differently: Apply 1 application topically 2 (two) times daily as needed (itching). )     Allergies:   Aspirin   Social History  Socioeconomic History  . Marital status: Married    Spouse name: [name]  . Number of children: 0  . Years of education: 9  . Highest education level: Not on file  Occupational History  . Occupation: Retired Systems developer: RETIRED  Social Needs  . Financial resource strain: Not on file  . Food insecurity:    Worry: Not on file    Inability: Not on file  . Transportation needs:    Medical: Not on file    Non-medical: Not on file  Tobacco Use  . Smoking status: Former Smoker    Packs/day: 1.00    Years: 20.00    Pack years: 20.00    Types: Cigarettes    Last attempt to quit: 02/10/1971    Years since quitting: 46.9  . Smokeless tobacco: Never Used  . Tobacco comment: 09/15/2012 "smoked  50-60 yr ago"  Substance and Sexual Activity  . Alcohol use: No    Alcohol/week: 0.0 standard drinks  . Drug use: No  . Sexual activity: Not on file  Lifestyle  . Physical activity:    Days per week: Not on file    Minutes per session: Not on file  . Stress: Not on file  Relationships  . Social connections:    Talks on phone: Not on file    Gets together: Not on file    Attends religious service: Not on file    Active member of club or organization: Not on file    Attends meetings of clubs or organizations: Not on file    Relationship status: Not on file  Other Topics Concern  . Not on file  Social History Narrative   Married, lives with husband, [name].  Ambulates without assistance.     Caffeine use: none     Family History:  The patient's family history includes Breast cancer in her unknown relative; Stroke in her father.   ROS:   Please see the history of present illness.    Review of Systems  Constitution: Negative.  HENT: Negative.   Eyes: Positive for visual disturbance.  Cardiovascular: Negative.   Respiratory: Negative.   Hematologic/Lymphatic: Bruises/bleeds easily.  Musculoskeletal: Positive for back pain and myalgias. Negative for joint pain.  Gastrointestinal: Positive for constipation.  Genitourinary: Negative.   Neurological: Negative.    All other systems reviewed and are negative.   PHYSICAL EXAM:   VS:  BP 120/60   Pulse 67   Ht _0  (1.6 m)   Wt 152 lb 1.9 oz (69 kg)   LMP  (LMP Unknown)   SpO2 99%   BMI 26.95 kg/m   Physical Exam  GEN: Well nourished, well developed, in no acute distress  Neck: no JVD, carotid bruits, or masses Cardiac:RRR; 3/6 systolic murmur at the left sternal border respiratory:  clear to auscultation bilaterally, normal work of breathing GI: soft, nontender, nondistended, + BS Ext: without cyanosis, clubbing, or edema, Good distal pulses bilaterally Neuro:  Alert and Oriented x 3 Psych: euthymic mood, full  affect  Wt Readings from Last 3 Encounters:  01/26/18 152 lb 1.9 oz (69 kg)  12/31/17 145 lb (65.8 kg)  12/23/17 149 lb (67.6 kg)      Studies/Labs Reviewed:   EKG:  EKG is not ordered today.    Recent Labs: 04/01/2017: NT-Pro BNP 133 12/04/2017: ALT 17 12/05/2017: BUN 33; Creatinine, Ser 1.51; Potassium 3.8; Sodium 142 01/24/2018: Hemoglobin 7.8; Platelets 181   Lipid Panel  Component Value Date/Time   CHOL 141 11/29/2014 1026   TRIG 141 11/29/2014 1026   HDL 31 (L) 11/29/2014 1026   CHOLHDL 4.5 11/29/2014 1026   VLDL 28 11/29/2014 1026   LDLCALC 82 11/29/2014 1026    Additional studies/ records that were reviewed today include:  2D echo 04/07/2017  Study Conclusions   - Left ventricle: The cavity size was normal. Wall thickness was   increased in a pattern of moderate LVH. Systolic function was   vigorous. The estimated ejection fraction was in the range of 65%   to 70%. Wall motion was normal; there were no regional wall   motion abnormalities. Features are consistent with a pseudonormal   left ventricular filling pattern, with concomitant abnormal   relaxation and increased filling pressure (grade 2 diastolic   dysfunction). - Aortic valve: Valve area (VTI): 1.71 cm^2. Valve area (Vmax):   1.38 cm^2. Valve area (Vmean): 1.5 cm^2. - Left atrium: The atrium was severely dilated. - Right atrium: The atrium was mildly dilated. - Pulmonary arteries: Systolic pressure was moderately increased.   PA peak pressure: 45 mm Hg (S).   Impressions:   - Normal / vigorous LV systolic function   Grade 2 diastolic dysfunction   Moderate pulmonary HTN     ASSESSMENT:    1. Chronic diastolic CHF (congestive heart failure) ([name]   2. Pulmonary HTN ([name]      PLAN:  In order of problems listed above:  Chronic diastolic CHF grade 2 DD on 2D echo normal LVEF 03/2017-managed with low-dose Lasix.  Follow-up with Dr. Radford Pax in 6 months  Pulmonary hypertension 2D echo  04/07/2017 normal LVEF with moderate pulmonary hypertension-blood pressure has always been normal.  Not on any antihypertensives.  Heart murmur no evidence of outflow tract gradient on echo 03/2017    Medication Adjustments/Labs and Tests Ordered: Current medicines are reviewed at length with the patient today.  Concerns regarding medicines are outlined above.  Medication changes, Labs and Tests ordered today are listed in the Patient Instructions below. Patient Instructions  Medication Instructions:  Your physician recommends that you continue on your current medications as directed. Please refer to the Current Medication list given to you today.  If you need a refill on your cardiac medications before your next appointment, please call your pharmacy.   Lab work: None Ordered  If you have labs (blood work) drawn today and your tests are completely normal, you will receive your results only by: Marland Kitchen MyChart Message (if you have MyChart) OR . A paper copy in the mail If you have any lab test that is abnormal or we need to change your treatment, we will call you to review the results.  Testing/Procedures: None ordered  Follow-Up: At [facility], you and your health needs are our priority.  As part of our continuing mission to provide you with exceptional heart care, we have created designated Provider Care Teams.  These Care Teams include your primary Cardiologist (physician) and Advanced Practice Providers (APPs -  Physician Assistants and Nurse Practitioners) who all work together to provide you with the care you need, when you need it. . You will need a follow up appointment in 6 months.  Please call our office 2 months in advance to schedule this appointment.  You may see Fransico Him, MD or one of the following Advanced Practice Providers on your designated Care Team:   . Lyda Jester, PA-C . Dayna Dunn, PA-C . Ermalinda Barrios, PA-C  Any  Other Special Instructions Will Be Listed  Below (If Applicable).       Signed, Ermalinda Barrios, PA-C  01/26/2018 1:27 PM    [facility] [address], [place], [place]  [address] Phone: [phone]; Fax: [phone]

## 2018-01-27 ENCOUNTER — Other Ambulatory Visit (HOSPITAL_COMMUNITY): Payer: Self-pay | Admitting: *Deleted

## 2018-01-28 ENCOUNTER — Ambulatory Visit (HOSPITAL_COMMUNITY)
Admission: RE | Admit: 2018-01-28 | Discharge: 2018-01-28 | Disposition: A | Discharge status: Home / Self Care | Payer: Medicare Other | Source: Ambulatory Visit | Attending: Oncology | Admitting: Oncology

## 2018-01-28 DIAGNOSIS — K31819 Angiodysplasia of stomach and duodenum without bleeding: Secondary | ICD-10-CM | POA: Diagnosis present

## 2018-01-28 DIAGNOSIS — D500 Iron deficiency anemia secondary to blood loss (chronic): Secondary | ICD-10-CM | POA: Insufficient documentation

## 2018-01-28 DIAGNOSIS — I780 Hereditary hemorrhagic telangiectasia: Secondary | ICD-10-CM | POA: Diagnosis not present

## 2018-01-28 LAB — PREPARE RBC (CROSSMATCH)

## 2018-01-28 MED ORDER — SODIUM CHLORIDE 0.9 % IV SOLN
510.0000 mg | INTRAVENOUS | Status: DC
  Administered 2018-01-28: 510 mg via INTRAVENOUS
  Filled 2018-01-28: qty 510

## 2018-01-28 MED ORDER — HEPARIN SOD (PORK) LOCK FLUSH 100 UNIT/ML IV SOLN
500.0000 [IU] | INTRAVENOUS | Status: AC | PRN
  Administered 2018-01-28: 500 [IU]

## 2018-01-28 MED ORDER — HEPARIN SOD (PORK) LOCK FLUSH 100 UNIT/ML IV SOLN
INTRAVENOUS | Status: AC
  Administered 2018-01-28: 500 [IU]
  Filled 2018-01-28: qty 5

## 2018-01-28 MED ORDER — SODIUM CHLORIDE 0.9% IV SOLUTION: Freq: Once | INTRAVENOUS | Status: DC

## 2018-01-28 NOTE — Progress Notes (Signed)
Pt states implanted port "feels like it moves". Port accessed, flushed, blood return noted.  Pt to see referring md for f/u.

## 2018-01-29 LAB — BPAM RBC
Blood Product Expiration Date: 202001052359
Blood Product Expiration Date: 202001062359
ISSUE DATE / TIME: 201912201031
ISSUE DATE / TIME: 201912201317
UNIT TYPE AND RH: 6200
Unit Type and Rh: 6200

## 2018-01-29 LAB — TYPE AND SCREEN
ABO/RH(D): A POS
Antibody Screen: NEGATIVE
Unit division: 0
Unit division: 0

## 2018-01-31 ENCOUNTER — Other Ambulatory Visit: Payer: Medicare Other

## 2018-02-04 ENCOUNTER — Encounter: Payer: Self-pay | Admitting: Dietician

## 2018-02-04 ENCOUNTER — Telehealth: Payer: Self-pay | Admitting: *Deleted

## 2018-02-04 ENCOUNTER — Other Ambulatory Visit (INDEPENDENT_AMBULATORY_CARE_PROVIDER_SITE_OTHER): Payer: Medicare Other

## 2018-02-04 ENCOUNTER — Other Ambulatory Visit: Payer: Self-pay | Admitting: *Deleted

## 2018-02-04 DIAGNOSIS — E1165 Type 2 diabetes mellitus with hyperglycemia: Principal | ICD-10-CM

## 2018-02-04 DIAGNOSIS — IMO0002 Reserved for concepts with insufficient information to code with codable children: Secondary | ICD-10-CM

## 2018-02-04 DIAGNOSIS — K31819 Angiodysplasia of stomach and duodenum without bleeding: Secondary | ICD-10-CM

## 2018-02-04 DIAGNOSIS — I780 Hereditary hemorrhagic telangiectasia: Secondary | ICD-10-CM | POA: Diagnosis not present

## 2018-02-04 DIAGNOSIS — D500 Iron deficiency anemia secondary to blood loss (chronic): Secondary | ICD-10-CM | POA: Diagnosis not present

## 2018-02-04 DIAGNOSIS — E1121 Type 2 diabetes mellitus with diabetic nephropathy: Secondary | ICD-10-CM

## 2018-02-04 LAB — CBC WITH DIFFERENTIAL/PLATELET
Abs Immature Granulocytes: 0.04 10*3/uL (ref 0.00–0.07)
Basophils Absolute: 0 10*3/uL (ref 0.0–0.1)
Basophils Relative: 1 %
Eosinophils Absolute: 0.4 10*3/uL (ref 0.0–0.5)
Eosinophils Relative: 7 %
HCT: 31.4 % — ABNORMAL LOW (ref 36.0–46.0)
Hemoglobin: 10.1 g/dL — ABNORMAL LOW (ref 12.0–15.0)
IMMATURE GRANULOCYTES: 1 %
Lymphocytes Relative: 7 %
Lymphs Abs: 0.5 10*3/uL — ABNORMAL LOW (ref 0.7–4.0)
MCH: 30.7 pg (ref 26.0–34.0)
MCHC: 32.2 g/dL (ref 30.0–36.0)
MCV: 95.4 fL (ref 80.0–100.0)
Monocytes Absolute: 0.5 10*3/uL (ref 0.1–1.0)
Monocytes Relative: 8 %
Neutro Abs: 4.6 10*3/uL (ref 1.7–7.7)
Neutrophils Relative %: 76 %
Platelets: UNDETERMINED 10*3/uL (ref 150–400)
RBC: 3.29 MIL/uL — ABNORMAL LOW (ref 3.87–5.11)
RDW: 16 % — ABNORMAL HIGH (ref 11.5–15.5)
WBC: 6.1 10*3/uL (ref 4.0–10.5)
nRBC: 0 % (ref 0.0–0.2)

## 2018-02-04 LAB — SAMPLE TO BLOOD BANK

## 2018-02-04 MED ORDER — LINAGLIPTIN 5 MG PO TABS: 5.0000 mg | ORAL_TABLET | Freq: Every day | ORAL | 0 refills | Status: DC

## 2018-02-04 MED ORDER — GLUCOSE BLOOD VI STRP: ORAL_STRIP | 1 refills | Status: DC

## 2018-02-04 NOTE — Telephone Encounter (Signed)
-----   Message from [name], MD sent at 02/04/2018 12:30 PM EST ----- Call pt: blood count good. No transfusion this week . Can skip lab on Monday 12/30; start back weekly labs the following Monday 02/14/18

## 2018-02-04 NOTE — Telephone Encounter (Signed)
She can keep that - see sees [employer]. 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

## 2018-02-04 NOTE — Telephone Encounter (Signed)
Pt called / informed "blood count good (Hgb 10.1). No transfusion this week . Can skip lab on Monday 12/30; start back weekly labs the following Monday 02/14/18 " per Dr [name]. But she has an appt on Monday in [facility] for c/o "blood blisters".

## 2018-02-04 NOTE — Progress Notes (Signed)
[name] requested her meter downloaded and requested her CBGs be reviewed. She says she is running out of test strips. This is being addressed by the triage nurse.   METER DOWNLOAD Report summary is from last 30 days,  Average tests per day: 3 Average blood glucose: 218 Range: minimum: 74 and maximum: 417 % in target range: 26 % below target range: 2 % above target range: ~60 % hypoglycemia: 0 Notes about patterns: blood sugars about the same over the past 10 days after GLP-1 discontinued and Tresiba increased by 2 units and linagliptin started.  Meter download report put in Dr. Janne Napoleon box.  Debera Lat, RD 02/04/2018 1:48 PM.

## 2018-02-07 ENCOUNTER — Ambulatory Visit (INDEPENDENT_AMBULATORY_CARE_PROVIDER_SITE_OTHER): Payer: Medicare Other | Admitting: Internal Medicine

## 2018-02-07 ENCOUNTER — Other Ambulatory Visit: Payer: Self-pay

## 2018-02-07 ENCOUNTER — Other Ambulatory Visit: Payer: Medicare Other

## 2018-02-07 VITALS — BP 125/42 | HR 68 | Temp 98.8°F | Ht 66.0 in | Wt 151.8 lb

## 2018-02-07 DIAGNOSIS — L439 Lichen planus, unspecified: Secondary | ICD-10-CM | POA: Diagnosis not present

## 2018-02-07 DIAGNOSIS — L819 Disorder of pigmentation, unspecified: Secondary | ICD-10-CM

## 2018-02-07 DIAGNOSIS — E1129 Type 2 diabetes mellitus with other diabetic kidney complication: Secondary | ICD-10-CM

## 2018-02-07 DIAGNOSIS — N289 Disorder of kidney and ureter, unspecified: Secondary | ICD-10-CM

## 2018-02-07 DIAGNOSIS — R011 Cardiac murmur, unspecified: Secondary | ICD-10-CM

## 2018-02-07 DIAGNOSIS — I5030 Unspecified diastolic (congestive) heart failure: Secondary | ICD-10-CM

## 2018-02-07 DIAGNOSIS — Z79899 Other long term (current) drug therapy: Secondary | ICD-10-CM

## 2018-02-07 DIAGNOSIS — E1165 Type 2 diabetes mellitus with hyperglycemia: Secondary | ICD-10-CM

## 2018-02-07 DIAGNOSIS — IMO0002 Reserved for concepts with insufficient information to code with codable children: Secondary | ICD-10-CM

## 2018-02-07 DIAGNOSIS — Z794 Long term (current) use of insulin: Secondary | ICD-10-CM

## 2018-02-07 MED ORDER — TRIAMCINOLONE ACETONIDE 0.5 % EX OINT: TOPICAL_OINTMENT | CUTANEOUS | 1 refills | Status: DC

## 2018-02-07 NOTE — Assessment & Plan Note (Signed)
Patient with bil LE lichen planus primary and secondary skin findings. She was seen by dermatology years 08/2015 who recommended triamcinolone. She has not been using this recently.  Plan: --refill triamcinolone

## 2018-02-07 NOTE — Patient Instructions (Addendum)
Start using triamcinolone creme for your leg lesions.  I have sent a referral to dermatology for the lesion on your right shin.  For your diabetes, take Tresiba 24 units daily and Trajenta one pill once a day. If your sugar is >70, please take 6oz of orange or apple juice or a small snack and recheck your sugar in 15 minutes. If less than 80, repeat the juice/snack and recheck in 15 minutes.

## 2018-02-07 NOTE — Assessment & Plan Note (Signed)
Patient with nodular lesion on right shin with black pigmentation and irregular borders. Patient reports occasional itching, denies easy bleeding. This lesion is significantly different from other skin findings (lichen planus). Patient does not think this lesion was there the last time she was seen by dermatology.   Due to findings, will refer to dermatology for excision; differential is melanoma, squamous cell carcinoma, seborrheic keratosis.  Plan: --referral to dermatology placed

## 2018-02-07 NOTE — Progress Notes (Signed)
   CC: Lichen planus  HPI:  [name] is a 77 y.o. with a PMH of dCHF, HHT, lichen planus, P3IR presenting to clinic for lichen planus and RLE lesion.  Please see problem based Assessment and Plan for status of patients chronic conditions.  Past Medical History:  Diagnosis Date  . Chronic anemia   . Chronic diastolic CHF (congestive heart failure) ([place]) 10/03/2013  . Chronic GI bleeding    [name] 11/29/2014  . Family history of anesthesia complication    "niece has a hard time coming out" (09/15/2012)  . Frequent nosebleeds    chronic  . Gastric AV malformation    [name] 11/29/2014  . GERD (gastroesophageal reflux disease)   . 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 History of blood transfusion "several"  . History of epistaxis   . HTN (hypertension), benign 03/02/2012  . Hyperlipidemia   . Iron deficiency anemia    chronic infusions"  . Lichen planus    Both lower extremities  . 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[place])    [name] 11/29/2014  . Overgrown toenails 03/18/2017  . Pneumonia 1990's X 2  . Pulmonary HTN ([place]) 04/01/2017   PASP 65mmHg on echo 09/2016  . Seizures ([place]) 09/2014  . Symptomatic anemia 11/29/2014  . Telangiectasia    Gastric   . 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    Review of Systems:   Per HPI  Physical Exam:  Vitals:   02/07/18 1406  BP: (!) 125/42  Pulse: 68  Temp: 98.8 F (37.1 C)  TempSrc: Oral  SpO2: 97%  Weight: 151 lb 12.8 oz (68.9 kg)  Height: 5\' 6"  (1.676 m)   GENERAL- alert, co-operative, appears as stated age, not in any distress. CARDIAC- RRR, systolic crescendo/decrescendo murmur best heard at LUSB, no rubs or gallops. RESP- Moving equal volumes of air, and clear to auscultation bilaterally, no wheezes or crackles. ABDOMEN- Soft, nontender, bowel sounds present. EXTREMITIES- pulse 2+, symmetric, no pedal edema. SKIN- Warm, dry. ~37mm, black nodule with irregular borders on anterior RLE  (shin) w/o tenderness or bleeding. Diffuse hyperpigmented, papular lesions on BLEs with secondary skin changes of atrophic scaring/hypopigmentation elsewhere.  Assessment & Plan:   See Encounters Tab for problem based charting.   Patient discussed with Dr. Angelia Mould   Alphonzo Grieve, MD Internal Medicine PGY-3

## 2018-02-07 NOTE — Assessment & Plan Note (Addendum)
Patient reports fluctuant CBGs recently; glucometer reviewed and revealed one hypoglycemic reading of 64 for which patient denies hypoglycemic symptoms. Between Dec 23-26th CBGs are significantly more elevated in high 200s-300, however last two days they're generally <220. Patient does endorse indulging in more food over Christmas.   Plan: --provided instructions on insulin dose again; continue Tresiba 24 units daily and tradjenta 5mg  daily --advised on management of hypoglycemia --advised on consistent eating and low carb diet --f/u with PCP in 6wks for A1c and glucometer review. Based on today's brief glucometer review, would expect her A1c to be higher than last time (5.9 in June); A1c might have been artificially low in setting of anemia and need for transfusion

## 2018-02-08 NOTE — Progress Notes (Signed)
Hi Dr. Koleen Distance, would you like me to continue working with patient to titrate her insulin? Please let me know what you would prefer.  Thank you  Delsa Sale

## 2018-02-14 ENCOUNTER — Telehealth: Payer: Self-pay | Admitting: *Deleted

## 2018-02-14 ENCOUNTER — Other Ambulatory Visit (INDEPENDENT_AMBULATORY_CARE_PROVIDER_SITE_OTHER): Payer: Medicare Other

## 2018-02-14 ENCOUNTER — Other Ambulatory Visit: Payer: Self-pay | Admitting: *Deleted

## 2018-02-14 ENCOUNTER — Other Ambulatory Visit: Payer: Self-pay | Admitting: Oncology

## 2018-02-14 DIAGNOSIS — D500 Iron deficiency anemia secondary to blood loss (chronic): Secondary | ICD-10-CM

## 2018-02-14 DIAGNOSIS — I780 Hereditary hemorrhagic telangiectasia: Secondary | ICD-10-CM

## 2018-02-14 DIAGNOSIS — K31819 Angiodysplasia of stomach and duodenum without bleeding: Secondary | ICD-10-CM

## 2018-02-14 DIAGNOSIS — Z794 Long term (current) use of insulin: Principal | ICD-10-CM

## 2018-02-14 DIAGNOSIS — E1165 Type 2 diabetes mellitus with hyperglycemia: Secondary | ICD-10-CM

## 2018-02-14 LAB — CBC WITH DIFFERENTIAL/PLATELET
Abs Immature Granulocytes: 0.04 10*3/uL (ref 0.00–0.07)
Basophils Absolute: 0 10*3/uL (ref 0.0–0.1)
Basophils Relative: 0 %
Eosinophils Absolute: 0.3 10*3/uL (ref 0.0–0.5)
Eosinophils Relative: 4 %
HCT: 24.2 % — ABNORMAL LOW (ref 36.0–46.0)
Hemoglobin: 7.4 g/dL — ABNORMAL LOW (ref 12.0–15.0)
Immature Granulocytes: 1 %
Lymphocytes Relative: 9 %
Lymphs Abs: 0.6 10*3/uL — ABNORMAL LOW (ref 0.7–4.0)
MCH: 31 pg (ref 26.0–34.0)
MCHC: 30.6 g/dL (ref 30.0–36.0)
MCV: 101.3 fL — ABNORMAL HIGH (ref 80.0–100.0)
MONOS PCT: 8 %
Monocytes Absolute: 0.5 10*3/uL (ref 0.1–1.0)
Neutro Abs: 5 10*3/uL (ref 1.7–7.7)
Neutrophils Relative %: 78 %
Platelets: 242 10*3/uL (ref 150–400)
RBC: 2.39 MIL/uL — ABNORMAL LOW (ref 3.87–5.11)
RDW: 17.2 % — ABNORMAL HIGH (ref 11.5–15.5)
WBC: 6.3 10*3/uL (ref 4.0–10.5)
nRBC: 0 % (ref 0.0–0.2)

## 2018-02-14 LAB — SAMPLE TO BLOOD BANK

## 2018-02-14 NOTE — Telephone Encounter (Signed)
Pt is requesting PCS services. Form given to Lauren D.  Also stated her sister-in-law is in the process to become PCS provider.

## 2018-02-14 NOTE — Telephone Encounter (Signed)
Today's hgb down to 7.4 - Dr [name] informed. Pt will need 2 units of PRBC's .  I called [facility] - appt scheduled for tomorrow morning @ 0800 AM.  Called pt - talked to Mr Lockamy, informed of results and of appt tomorrow morning which he said will be ok. And will let Mrs. Sato know.  Dr Darnell Level called / informed of appt date/time.

## 2018-02-14 NOTE — Progress Notes (Signed)
Internal Medicine Clinic Attending  Case discussed with Dr. Svalina  at the time of the visit.  We reviewed the resident's history and exam and pertinent patient test results.  I agree with the assessment, diagnosis, and plan of care documented in the resident's note.  

## 2018-02-15 ENCOUNTER — Ambulatory Visit (HOSPITAL_COMMUNITY)
Admission: RE | Admit: 2018-02-15 | Discharge: 2018-02-15 | Disposition: A | Discharge status: Home / Self Care | Payer: Medicare Other | Source: Ambulatory Visit | Attending: Oncology | Admitting: Oncology

## 2018-02-15 DIAGNOSIS — D500 Iron deficiency anemia secondary to blood loss (chronic): Secondary | ICD-10-CM | POA: Diagnosis present

## 2018-02-15 DIAGNOSIS — K31819 Angiodysplasia of stomach and duodenum without bleeding: Secondary | ICD-10-CM | POA: Diagnosis present

## 2018-02-15 DIAGNOSIS — I780 Hereditary hemorrhagic telangiectasia: Secondary | ICD-10-CM | POA: Diagnosis present

## 2018-02-15 LAB — PREPARE RBC (CROSSMATCH)

## 2018-02-15 MED ORDER — SODIUM CHLORIDE 0.9% IV SOLUTION: Freq: Once | INTRAVENOUS | Status: DC

## 2018-02-15 MED ORDER — HEPARIN SOD (PORK) LOCK FLUSH 100 UNIT/ML IV SOLN
INTRAVENOUS | Status: AC
  Administered 2018-02-15: 500 [IU]
  Filled 2018-02-15: qty 5

## 2018-02-16 ENCOUNTER — Telehealth: Payer: Self-pay

## 2018-02-16 LAB — TYPE AND SCREEN
ABO/RH(D): A POS
Antibody Screen: NEGATIVE
Unit division: 0
Unit division: 0

## 2018-02-16 LAB — BPAM RBC
Blood Product Expiration Date: 202002012359
Blood Product Expiration Date: 202002012359
ISSUE DATE / TIME: 202001070831
ISSUE DATE / TIME: 202001071051
UNIT TYPE AND RH: 6200
Unit Type and Rh: 6200

## 2018-02-16 NOTE — Telephone Encounter (Signed)
Pt's husband want to know what is the status on the PCS form for personal care service.

## 2018-02-17 NOTE — Telephone Encounter (Signed)
Patient does not have full medicaid, only MQBQ which will not cover PCS. Patient will need to contact her Case Worker, Alda Lea [phone]. Attempted to notify patient but husband answered phone, explained situation to him. He gave alternate number for patient [phone]). No answer, left VM requesting return call. Hubbard Hartshorn, RN, BSN

## 2018-02-17 NOTE — Telephone Encounter (Signed)
Please see phone encounter from 02/14/2018. Hubbard Hartshorn, RN, BSN

## 2018-02-20 MED ORDER — INSULIN DEGLUDEC 100 UNIT/ML ~~LOC~~ SOPN: 24.0000 [IU] | PEN_INJECTOR | SUBCUTANEOUS | 1 refills | Status: DC

## 2018-02-21 ENCOUNTER — Telehealth: Payer: Self-pay | Admitting: *Deleted

## 2018-02-21 ENCOUNTER — Other Ambulatory Visit (INDEPENDENT_AMBULATORY_CARE_PROVIDER_SITE_OTHER): Payer: Medicare Other

## 2018-02-21 DIAGNOSIS — K31819 Angiodysplasia of stomach and duodenum without bleeding: Secondary | ICD-10-CM | POA: Diagnosis not present

## 2018-02-21 DIAGNOSIS — D500 Iron deficiency anemia secondary to blood loss (chronic): Secondary | ICD-10-CM

## 2018-02-21 DIAGNOSIS — I780 Hereditary hemorrhagic telangiectasia: Secondary | ICD-10-CM

## 2018-02-21 LAB — CBC WITH DIFFERENTIAL/PLATELET
ABS IMMATURE GRANULOCYTES: 0.02 10*3/uL (ref 0.00–0.07)
Basophils Absolute: 0 10*3/uL (ref 0.0–0.1)
Basophils Relative: 1 %
Eosinophils Absolute: 0.1 10*3/uL (ref 0.0–0.5)
Eosinophils Relative: 1 %
HCT: 29.1 % — ABNORMAL LOW (ref 36.0–46.0)
Hemoglobin: 9.1 g/dL — ABNORMAL LOW (ref 12.0–15.0)
Immature Granulocytes: 0 %
Lymphocytes Relative: 4 %
Lymphs Abs: 0.2 10*3/uL — ABNORMAL LOW (ref 0.7–4.0)
MCH: 30 pg (ref 26.0–34.0)
MCHC: 31.3 g/dL (ref 30.0–36.0)
MCV: 96 fL (ref 80.0–100.0)
Monocytes Absolute: 0.4 10*3/uL (ref 0.1–1.0)
Monocytes Relative: 7 %
NEUTROS ABS: 4.5 10*3/uL (ref 1.7–7.7)
Neutrophils Relative %: 87 %
Platelets: 125 10*3/uL — ABNORMAL LOW (ref 150–400)
RBC: 3.03 MIL/uL — ABNORMAL LOW (ref 3.87–5.11)
RDW: 15.9 % — ABNORMAL HIGH (ref 11.5–15.5)
WBC: 5.2 10*3/uL (ref 4.0–10.5)
nRBC: 0 % (ref 0.0–0.2)

## 2018-02-21 LAB — SAMPLE TO BLOOD BANK

## 2018-02-21 LAB — FERRITIN: Ferritin: 44 ng/mL (ref 11–307)

## 2018-02-21 NOTE — Telephone Encounter (Signed)
Called pt - talked to [name]; informed her "Hb Ok no blood this week." per Dr [name]. Pt had asked the lab about Dermatology, ophthalmology, and podiatry  Referrals/appts. Talked to Lela - she will call Dr Onalee Hua office about recent derm referral. Informed Mr Chambers pt can call her eye and foot doctors to schedule an appt and if a new referral is needed, let us know.

## 2018-02-22 ENCOUNTER — Telehealth: Payer: Self-pay | Admitting: *Deleted

## 2018-02-22 ENCOUNTER — Other Ambulatory Visit: Payer: Self-pay | Admitting: Internal Medicine

## 2018-02-22 DIAGNOSIS — E1129 Type 2 diabetes mellitus with other diabetic kidney complication: Secondary | ICD-10-CM

## 2018-02-22 DIAGNOSIS — IMO0002 Reserved for concepts with insufficient information to code with codable children: Secondary | ICD-10-CM

## 2018-02-22 DIAGNOSIS — E1165 Type 2 diabetes mellitus with hyperglycemia: Principal | ICD-10-CM

## 2018-02-22 NOTE — Telephone Encounter (Signed)
SPOKE WITH PATIENT REGARDING HER DERMATOLOGY REFERRAL TO DR.TAFEEN. PHONE NUMBER [phone] WAS GIVEN TO HER TO CALL AND SET UP HER APPOINTMENT.  THEIR OFFICE HAD SPOKEN WITH HER, PATIENT DIDN'T REMEMBER THAT.

## 2018-02-23 NOTE — Telephone Encounter (Signed)
Patient called back on 02/22/2018. Explained situation to her. She will contact her Case Worker to reapply for full medicaid. Hubbard Hartshorn, RN, BSN

## 2018-03-01 ENCOUNTER — Other Ambulatory Visit (INDEPENDENT_AMBULATORY_CARE_PROVIDER_SITE_OTHER): Payer: Medicare Other

## 2018-03-01 ENCOUNTER — Other Ambulatory Visit (HOSPITAL_COMMUNITY): Payer: Self-pay | Admitting: Oncology

## 2018-03-01 ENCOUNTER — Telehealth: Payer: Self-pay | Admitting: *Deleted

## 2018-03-01 DIAGNOSIS — I780 Hereditary hemorrhagic telangiectasia: Secondary | ICD-10-CM

## 2018-03-01 DIAGNOSIS — D500 Iron deficiency anemia secondary to blood loss (chronic): Secondary | ICD-10-CM

## 2018-03-01 DIAGNOSIS — K31819 Angiodysplasia of stomach and duodenum without bleeding: Secondary | ICD-10-CM

## 2018-03-01 LAB — CBC WITH DIFFERENTIAL/PLATELET
Abs Immature Granulocytes: 0.03 10*3/uL (ref 0.00–0.07)
Basophils Absolute: 0 10*3/uL (ref 0.0–0.1)
Basophils Relative: 1 %
EOS PCT: 5 %
Eosinophils Absolute: 0.2 10*3/uL (ref 0.0–0.5)
HCT: 25.2 % — ABNORMAL LOW (ref 36.0–46.0)
HEMOGLOBIN: 7.4 g/dL — AB (ref 12.0–15.0)
Immature Granulocytes: 1 %
LYMPHS PCT: 5 %
Lymphs Abs: 0.2 10*3/uL — ABNORMAL LOW (ref 0.7–4.0)
MCH: 29.7 pg (ref 26.0–34.0)
MCHC: 29.4 g/dL — ABNORMAL LOW (ref 30.0–36.0)
MCV: 101.2 fL — ABNORMAL HIGH (ref 80.0–100.0)
Monocytes Absolute: 0.4 10*3/uL (ref 0.1–1.0)
Monocytes Relative: 8 %
Neutro Abs: 3.7 10*3/uL (ref 1.7–7.7)
Neutrophils Relative %: 80 %
Platelets: 154 10*3/uL (ref 150–400)
RBC: 2.49 MIL/uL — ABNORMAL LOW (ref 3.87–5.11)
RDW: 15.9 % — ABNORMAL HIGH (ref 11.5–15.5)
WBC: 4.6 10*3/uL (ref 4.0–10.5)
nRBC: 0 % (ref 0.0–0.2)

## 2018-03-01 LAB — SAMPLE TO BLOOD BANK

## 2018-03-01 NOTE — Telephone Encounter (Signed)
-----   Message from [name], MD sent at 03/01/2018 12:46 PM EST ----- Call pt: Hb down. She will need blood. See if we can find a spot this week. If short stay full - try cancer center. Thx. DrG

## 2018-03-01 NOTE — Telephone Encounter (Signed)
Talked to [employer] scheduled here at [place] for tomorrow am @ 0800 AM. Called pt - talked to her husband - informed pt's Hgb is 7.4 and needs blood transfusion per Dr [name]. Informed of appt tomorrow am - voiced understanding.

## 2018-03-02 ENCOUNTER — Ambulatory Visit (HOSPITAL_COMMUNITY)
Admission: RE | Admit: 2018-03-02 | Discharge: 2018-03-02 | Disposition: A | Discharge status: Home / Self Care | Payer: Medicare Other | Source: Ambulatory Visit | Attending: Oncology | Admitting: Oncology

## 2018-03-02 DIAGNOSIS — D500 Iron deficiency anemia secondary to blood loss (chronic): Secondary | ICD-10-CM | POA: Diagnosis present

## 2018-03-02 DIAGNOSIS — I780 Hereditary hemorrhagic telangiectasia: Secondary | ICD-10-CM

## 2018-03-02 DIAGNOSIS — K31819 Angiodysplasia of stomach and duodenum without bleeding: Secondary | ICD-10-CM | POA: Diagnosis present

## 2018-03-02 LAB — PREPARE RBC (CROSSMATCH)

## 2018-03-02 MED ORDER — HEPARIN SOD (PORK) LOCK FLUSH 100 UNIT/ML IV SOLN
500.0000 [IU] | INTRAVENOUS | Status: DC
  Administered 2018-03-02: 500 [IU]

## 2018-03-02 MED ORDER — SODIUM CHLORIDE 0.9% IV SOLUTION: Freq: Once | INTRAVENOUS | Status: DC

## 2018-03-02 MED ORDER — HEPARIN SOD (PORK) LOCK FLUSH 100 UNIT/ML IV SOLN
INTRAVENOUS | Status: AC
  Administered 2018-03-02: 500 [IU]
  Filled 2018-03-02: qty 5

## 2018-03-02 MED ORDER — HEPARIN SOD (PORK) LOCK FLUSH 100 UNIT/ML IV SOLN: 500.0000 [IU] | INTRAVENOUS | Status: DC | PRN

## 2018-03-03 ENCOUNTER — Encounter: Payer: Self-pay | Admitting: *Deleted

## 2018-03-03 LAB — TYPE AND SCREEN
ABO/RH(D): A POS
Antibody Screen: NEGATIVE
Unit division: 0
Unit division: 0

## 2018-03-03 LAB — BPAM RBC
BLOOD PRODUCT EXPIRATION DATE: 202002082359
Blood Product Expiration Date: 202001282359
ISSUE DATE / TIME: 202001220902
ISSUE DATE / TIME: 202001221136
Unit Type and Rh: 6200
Unit Type and Rh: 6200

## 2018-03-04 ENCOUNTER — Telehealth: Payer: Self-pay | Admitting: Dietician

## 2018-03-04 NOTE — Telephone Encounter (Signed)
Discussed meter download x 39 days showing hypoglycemia (53, 63, 64, 74, 77, 79 mostly before breakfast in the past 2 weeks) with Dr. Maudie Mercury who agreed to decreasing [name]'s Tresiba to 22 units. I left a message on her mobile phone telling her to start taking 22 units of Tresiba starting tomorrow. Mrs. Revels has an appointment with Dr. Maudie Mercury on Monday 03/07/17.

## 2018-03-07 ENCOUNTER — Other Ambulatory Visit (INDEPENDENT_AMBULATORY_CARE_PROVIDER_SITE_OTHER): Payer: Medicare Other

## 2018-03-07 ENCOUNTER — Ambulatory Visit (INDEPENDENT_AMBULATORY_CARE_PROVIDER_SITE_OTHER): Payer: Medicare Other | Admitting: Pharmacist

## 2018-03-07 DIAGNOSIS — E1165 Type 2 diabetes mellitus with hyperglycemia: Secondary | ICD-10-CM | POA: Diagnosis not present

## 2018-03-07 DIAGNOSIS — IMO0002 Reserved for concepts with insufficient information to code with codable children: Secondary | ICD-10-CM

## 2018-03-07 DIAGNOSIS — E1129 Type 2 diabetes mellitus with other diabetic kidney complication: Secondary | ICD-10-CM | POA: Diagnosis not present

## 2018-03-07 DIAGNOSIS — D500 Iron deficiency anemia secondary to blood loss (chronic): Secondary | ICD-10-CM

## 2018-03-07 DIAGNOSIS — K31819 Angiodysplasia of stomach and duodenum without bleeding: Secondary | ICD-10-CM | POA: Diagnosis not present

## 2018-03-07 DIAGNOSIS — E1121 Type 2 diabetes mellitus with diabetic nephropathy: Secondary | ICD-10-CM | POA: Diagnosis not present

## 2018-03-07 DIAGNOSIS — I780 Hereditary hemorrhagic telangiectasia: Secondary | ICD-10-CM

## 2018-03-07 LAB — SAMPLE TO BLOOD BANK

## 2018-03-07 LAB — CBC WITH DIFFERENTIAL/PLATELET
Abs Immature Granulocytes: 0.01 10*3/uL (ref 0.00–0.07)
Basophils Absolute: 0 10*3/uL (ref 0.0–0.1)
Basophils Relative: 1 %
Eosinophils Absolute: 0.2 10*3/uL (ref 0.0–0.5)
Eosinophils Relative: 3 %
HCT: 31.2 % — ABNORMAL LOW (ref 36.0–46.0)
HEMOGLOBIN: 9.5 g/dL — AB (ref 12.0–15.0)
Immature Granulocytes: 0 %
Lymphocytes Relative: 7 %
Lymphs Abs: 0.4 10*3/uL — ABNORMAL LOW (ref 0.7–4.0)
MCH: 30.4 pg (ref 26.0–34.0)
MCHC: 30.4 g/dL (ref 30.0–36.0)
MCV: 99.7 fL (ref 80.0–100.0)
Monocytes Absolute: 0.4 10*3/uL (ref 0.1–1.0)
Monocytes Relative: 6 %
NEUTROS PCT: 83 %
Neutro Abs: 5 10*3/uL (ref 1.7–7.7)
Platelets: 143 10*3/uL — ABNORMAL LOW (ref 150–400)
RBC: 3.13 MIL/uL — AB (ref 3.87–5.11)
RDW: 15.4 % (ref 11.5–15.5)
WBC: 6.1 10*3/uL (ref 4.0–10.5)
nRBC: 0 % (ref 0.0–0.2)

## 2018-03-07 MED ORDER — GLUCOSE BLOOD VI STRP: ORAL_STRIP | 3 refills | Status: DC

## 2018-03-07 MED ORDER — DULAGLUTIDE 0.75 MG/0.5ML ~~LOC~~ SOAJ: 0.7500 mg | SUBCUTANEOUS | 0 refills | Status: DC

## 2018-03-07 MED ORDER — ONETOUCH VERIO W/DEVICE KIT: 1.0000 | PACK | Freq: Once | 0 refills | Status: AC

## 2018-03-07 NOTE — Progress Notes (Signed)
S: [name] is a 78 y.o. female reports to clinical pharmacist appointment for diabetes management per Dr. Koleen Distance. She presented with family members who assist in her care at home.  Allergies  Allergen Reactions  . Aspirin Nausea And Vomiting   Medication Sig  augmented betamethasone dipropionate (DIPROLENE-AF) 0.05 % cream Apply 1 application topically daily.  b complex vitamins capsule Take 1 capsule by mouth daily.  Blood Glucose Monitoring Suppl (ONETOUCH VERIO) w/Device KIT   calcium citrate-vitamin D (CITRACAL+D) 315-200 MG-UNIT tablet Take 1 tablet by mouth 2 (two) times daily.  cetaphil (CETAPHIL) lotion Apply 1 application topically as needed for dry skin.  donepezil (ARICEPT) 10 MG tablet Take 1 tablet (10 mg total) by mouth at bedtime.  Elastic Bandages & Supports (MEDICAL COMPRESSION STOCKINGS) MISC Wear as much as possible while awake to reduce swelling  FERROCITE 324 MG TABS tablet TAKE 1 TABLET BY MOUTH TWICE DAILY  furosemide (LASIX) 20 MG tablet TAKE 1 TABLET BY MOUTH ONCE DAILY Patient taking differently: Take 20 mg by mouth daily.   glucose blood (ONETOUCH VERIO) test strip Use as instructed 4 times daily. E11.29, insulin requiring  insulin degludec (TRESIBA FLEXTOUCH) 100 UNIT/ML SOPN FlexTouch Pen Inject 0.24 mLs (24 Units total) into the skin every morning.  Insulin Pen Needle (B-D UF III MINI PEN NEEDLES) 31G X 5 MM MISC USE TO INJECT INSULIN 3 TIMES DAILY  lactulose (CHRONULAC) 10 GM/15ML solution TAKE 15 ML BY MOUTH TWO TIMES DAILY  levETIRAcetam (KEPPRA) 500 MG tablet Take 1 tablet (500 mg total) by mouth 2 (two) times daily.  linagliptin (TRADJENTA) 5 MG TABS tablet Take 1 tablet (5 mg total) by mouth daily.  memantine (NAMENDA) 10 MG tablet Take 1 tablet (10 mg total) by mouth 2 (two) times daily.  ONETOUCH DELICA LANCETS FINE MISC Check blood sugar up to 4 times a day  oxymetazoline (AFRIN) 0.05 % nasal spray Place 1 spray into both nostrils 2 (two)  times daily as needed (Epistaxis).  pantoprazole (PROTONIX) 40 MG tablet TAKE 1 TABLET BY MOUTH ONCE DAILY  potassium chloride (K-DUR) 10 MEQ tablet Take 1 tablet (10 mEq total) by mouth daily.  pramipexole (MIRAPEX) 0.125 MG tablet TAKE 1 TABLET BY MOUTH ONCE DAILY  sodium chloride (OCEAN) 0.65 % SOLN nasal spray Place 1 spray into both nostrils as needed for congestion.  triamcinolone ointment (KENALOG) 0.5 % APPLY 1 APPLICATION TOPICALLY TO RASH TWICE DAILY FOR ITCHING   Past Medical History:  Diagnosis Date  . Chronic anemia   . Chronic diastolic CHF (congestive heart failure) ([place]) 10/03/2013  . Chronic GI bleeding    [name] 11/29/2014  . Family history of anesthesia complication    "niece has a hard time coming out" (09/15/2012)  . Frequent nosebleeds    chronic  . Gastric AV malformation    [name] 11/29/2014  . GERD (gastroesophageal reflux disease)   . 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 History of blood transfusion "several"  . History of epistaxis   . HTN (hypertension), benign 03/02/2012  . Hyperlipidemia   . Iron deficiency anemia    chronic infusions"  . Lichen planus    Both lower extremities  . 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 Overgrown toenails 03/18/2017  . Pneumonia 1990's X 2  . Pulmonary HTN ([place]) 04/01/2017   PASP 54mHg on echo 09/2016  . [place]  . Symptomatic anemia 11/29/2014  . Telangiectasia  Gastric   . 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   Social History   Socioeconomic History  . Marital status: Married    Spouse name: [name]  . Number of children: 0  . Years of education: 9  . Highest education level: Not on file  Occupational History  . Occupation: Retired Systems developer: RETIRED  Social Needs  . Financial resource strain: Not on file  . Food insecurity:    Worry: Not on file    Inability: Not on file  . Transportation needs:    Medical: Not on  file    Non-medical: Not on file  Tobacco Use  . Smoking status: Former Smoker    Packs/day: 1.00    Years: 20.00    Pack years: 20.00    Types: Cigarettes    Last attempt to quit: 02/10/1971    Years since quitting: 47.1  . Smokeless tobacco: Never Used  . Tobacco comment: 09/15/2012 "smoked 50-60 yr ago"  Substance and Sexual Activity  . Alcohol use: No    Alcohol/week: 0.0 standard drinks  . Drug use: No  . Sexual activity: Not on file  Lifestyle  . Physical activity:    Days per week: Not on file    Minutes per session: Not on file  . Stress: Not on file  Relationships  . Social connections:    Talks on phone: Not on file    Gets together: Not on file    Attends religious service: Not on file    Active member of club or organization: Not on file    Attends meetings of clubs or organizations: Not on file    Relationship status: Not on file  Other Topics Concern  . Not on file  Social History Narrative   Married, lives with husband, [name].  Ambulates without assistance.     Caffeine use: none   Family History  Problem Relation Age of Onset  . Stroke Father   . Breast cancer Other   . Malignant hyperthermia Neg Hx   . Seizures Neg Hx    O:    Component Value Date/Time   CHOL 141 11/29/2014 1026   HDL 31 (L) 11/29/2014 1026   LDLCALC 82 11/29/2014 1026   TRIG 141 11/29/2014 1026   GLUCOSE 75 12/05/2017 0250   GLUCOSE 156 (H) 04/04/2014 1019   GLUCOSE 152 (H) 12/28/2011 0825   HGBA1C 5.9 (A) 07/15/2017 1127   HGBA1C 7.3 (H) 09/05/2014 0720   NA 142 12/05/2017 0250   NA 145 (H) 11/18/2017 1408   NA 144 04/04/2014 1019   K 3.8 12/05/2017 0250   K 4.5 04/04/2014 1019   CL 110 12/05/2017 0250   CL 111 (H) 12/28/2011 0825   CO2 29 12/05/2017 0250   CO2 26 04/04/2014 1019   BUN 33 (H) 12/05/2017 0250   BUN 20 11/18/2017 1408   BUN 16.9 04/04/2014 1019   CREATININE 1.51 (H) 12/05/2017 0250   CREATININE 0.92 01/28/2015 0811   CREATININE 0.9 04/04/2014 1019    CALCIUM 8.8 (L) 12/05/2017 0250   CALCIUM 9.3 04/04/2014 1019   GFRNONAA 32 (L) 12/05/2017 0250   GFRAA 37 (L) 12/05/2017 0250   AST 24 12/04/2017 1230   AST 30 04/04/2014 1019   ALT 17 12/04/2017 1230   ALT 25 04/04/2014 1019   WBC 4.6 03/01/2018 1140   HGB 7.4 (L) 03/01/2018 1140   HGB 9.7 (L) 03/13/2015 1059   HCT  25.2 (L) 03/01/2018 1140   HCT 29.5 (L) 03/13/2015 1059   PLT 154 03/01/2018 1140   PLT 140 (L) 03/13/2015 1059   PLT 237 11/09/2014 1547   TSH 1.730 01/30/2015 1216   Ht Readings from Last 2 Encounters:  03/02/18 '5\' 2"'$  (1.575 m)  02/15/18 '5\' 3"'$  (1.6 m)   Wt Readings from Last 2 Encounters:  03/02/18 159 lb (72.1 kg)  02/15/18 155 lb (70.3 kg)   There is no height or weight on file to calculate BMI. BP Readings from Last 3 Encounters:  03/02/18 (!) 124/44  02/15/18 (!) 111/47  02/07/18 (!) 125/42    A/P:  Last week, BG download x 39 days showed hypoglycemia (53, 63, 64, 74, 77, 79 mostly before breakfast in the past 2 weeks). In response, her insulin dose was reduced from 24 units to 22 units daily by [facility], and patient has only experienced 1 hypoglycemic event since.   Home BG average past week ~180 mg/dL, only one result < 70 mg/dL, one > 300 mg/dL  Patient requested one more trial of GLP1 agonist due to renal and CV benefits. In the past, patient has tried liraglutide and semaglutide, but unfortunately did not tolerate these. Discussed risk vs benefits and patient agreed to try dulaglutide (pre-approved by PCP).  Patient and family were advised to decrease insulin degludec from 22 units daily to 20 units daily, and initiate dulaglutide 0.75 mg weekly. Education was provided on dulaglutide use.  An after visit summary was provided and patient advised to follow up in 1 week or sooner if any changes in condition or questions regarding medications arise.   The patient, family and caregiver verbalized understanding of information provided by repeating  back concepts discussed.   15 minutes spent face-to-face with the patient during the encounter. 50% of time spent on education. 50% of time was spent on assessment, plan, and coordination of care.

## 2018-03-07 NOTE — Addendum Note (Signed)
Addended by: Forde Dandy on: 03/07/2018 12:52 PM   Modules accepted: Orders

## 2018-03-07 NOTE — Patient Instructions (Addendum)
Decrease Tresiba (insulin degludec) to 20 units daily Start Trulicity (dulaglutide) 0.75 mg once weekly Call [phone] for an [facility] appointment

## 2018-03-08 ENCOUNTER — Telehealth: Payer: Self-pay | Admitting: *Deleted

## 2018-03-08 ENCOUNTER — Ambulatory Visit (INDEPENDENT_AMBULATORY_CARE_PROVIDER_SITE_OTHER): Payer: Medicare Other | Admitting: Internal Medicine

## 2018-03-08 ENCOUNTER — Ambulatory Visit (HOSPITAL_COMMUNITY)
Admission: RE | Admit: 2018-03-08 | Discharge: 2018-03-08 | Disposition: A | Discharge status: Home / Self Care | Payer: Medicare Other | Source: Ambulatory Visit | Attending: Internal Medicine | Admitting: Internal Medicine

## 2018-03-08 ENCOUNTER — Other Ambulatory Visit: Payer: Self-pay | Admitting: Internal Medicine

## 2018-03-08 ENCOUNTER — Other Ambulatory Visit: Payer: Self-pay

## 2018-03-08 ENCOUNTER — Other Ambulatory Visit: Payer: Self-pay | Admitting: *Deleted

## 2018-03-08 VITALS — BP 129/55 | HR 66 | Temp 98.2°F | Ht 63.0 in | Wt 159.0 lb

## 2018-03-08 DIAGNOSIS — Z79899 Other long term (current) drug therapy: Secondary | ICD-10-CM

## 2018-03-08 DIAGNOSIS — M25551 Pain in right hip: Secondary | ICD-10-CM

## 2018-03-08 DIAGNOSIS — Z794 Long term (current) use of insulin: Principal | ICD-10-CM

## 2018-03-08 DIAGNOSIS — I10 Essential (primary) hypertension: Secondary | ICD-10-CM

## 2018-03-08 DIAGNOSIS — R011 Cardiac murmur, unspecified: Secondary | ICD-10-CM | POA: Diagnosis not present

## 2018-03-08 DIAGNOSIS — R600 Localized edema: Secondary | ICD-10-CM | POA: Diagnosis not present

## 2018-03-08 DIAGNOSIS — E1165 Type 2 diabetes mellitus with hyperglycemia: Secondary | ICD-10-CM

## 2018-03-08 DIAGNOSIS — I872 Venous insufficiency (chronic) (peripheral): Secondary | ICD-10-CM

## 2018-03-08 MED ORDER — INSULIN DEGLUDEC 100 UNIT/ML ~~LOC~~ SOPN: 20.0000 [IU] | PEN_INJECTOR | SUBCUTANEOUS | 1 refills | Status: DC

## 2018-03-08 MED ORDER — TRAMADOL HCL 50 MG PO TABS: 50.0000 mg | ORAL_TABLET | Freq: Two times a day (BID) | ORAL | 0 refills | Status: DC | PRN

## 2018-03-08 NOTE — Telephone Encounter (Signed)
Pt is here at the clinic for an appt - informed " blood count OK (@ 9.5). No transfusion this week " per Dr Sanjuana Letters.

## 2018-03-08 NOTE — Patient Instructions (Addendum)
Thank you for allowing [place] to provide your care today. Today you came in due to swelling your legs and right hip pain. As we talked, please avoid salty food, canned food.  Continue taking the Lasix. Part of the swelling is due to venous insufficiency. (dilated vein)   I send you to do an X ray of your hip to evaluate for arthritis. I send prescription for pain medication to be taken every 12 hours as needed. I have ordered some blood test for you. I will call if any are abnormal.    Today I did not make changes to your previous medications.   Please follow-up in our clinic with your PCP in 2-3 months or sooner as needed.  Should you have any questions or concerns please call the internal medicine clinic at [phone].

## 2018-03-08 NOTE — Progress Notes (Signed)
   CC: Leg swelling  HPI:  [name] is a 78 y.o. female with past medical history listed below, comes to clinic today for bilateral leg swelling.  Please refer to problem based charting for further details and assessment and plan.  Past Medical History:  Diagnosis Date  . Chronic anemia   . Chronic diastolic CHF (congestive heart failure) ([place]) 10/03/2013  . Chronic GI bleeding    [name] 11/29/2014  . Family history of anesthesia complication    "niece has a hard time coming out" (09/15/2012)  . Frequent nosebleeds    chronic  . Gastric AV malformation    [name] 11/29/2014  . GERD (gastroesophageal reflux disease)   . 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 History of blood transfusion "several"  . History of epistaxis   . HTN (hypertension), benign 03/02/2012  . Hyperlipidemia   . Iron deficiency anemia    chronic infusions"  . Lichen planus    Both lower extremities  . 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 Overgrown toenails 03/18/2017  . Pneumonia 1990's X 2  . Pulmonary HTN ([place]) 04/01/2017   PASP 34mmHg on echo 09/2016  . Seizures ([place]) 09/2014  . Symptomatic anemia 11/29/2014  . Telangiectasia    Gastric   . 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   Review of Systems: Review of Systems  Respiratory: Negative for cough and shortness of breath.   Cardiovascular: Positive for leg swelling. Negative for chest pain and orthopnea.  Gastrointestinal: Negative for abdominal pain, nausea and vomiting.  Neurological: Negative for dizziness and headaches.   Physical Exam:  Vitals:   03/08/18 1023 03/08/18 1058  BP: (!) 118/42 (!) 129/55  Pulse: 66   Temp: 98.2 F (36.8 C)   TempSrc: Oral   SpO2: 97%   Weight: 159 lb (72.1 kg)   Height: 5\' 3"  (1.6 m)    Physical Exam Constitutional:      General: She is not in acute distress.    Appearance: Normal appearance. She is not ill-appearing.  HENT:     Head:  Normocephalic and atraumatic.  Eyes:     Extraocular Movements: Extraocular movements intact.  Cardiovascular:     Heart sounds: Murmur present.     Comments: Regular irregular rhythm Pulmonary:     Effort: Pulmonary effort is normal.     Breath sounds: Normal breath sounds. No wheezing or rales.  Abdominal:     General: Bowel sounds are normal. There is no distension.     Palpations: Abdomen is soft.     Tenderness: There is no abdominal tenderness.  Musculoskeletal:     Right lower leg: Edema present.     Left lower leg: Edema present.  Neurological:     General: No focal deficit present.     Mental Status: She is alert. Mental status is at baseline.  Psychiatric:        Mood and Affect: Mood normal.        Behavior: Behavior normal.        Thought Content: Thought content normal.        Judgment: Judgment normal.     Assessment & Plan:   See Encounters Tab for problem based charting.  Patient seen with Dr. Eppie Gibson.

## 2018-03-08 NOTE — Progress Notes (Signed)
Decreased patient's Tresiba to 20 units due to recurrent hypoglycemia.

## 2018-03-08 NOTE — Telephone Encounter (Signed)
-----   Message from [name], MD sent at 03/07/2018  3:23 PM EST ----- Call pt: blood count OK. No transfusion this week

## 2018-03-09 LAB — BMP8+ANION GAP
Anion Gap: 15 mmol/L (ref 10.0–18.0)
BUN/Creatinine Ratio: 13 (ref 12–28)
BUN: 17 mg/dL (ref 8–27)
CHLORIDE: 103 mmol/L (ref 96–106)
CO2: 22 mmol/L (ref 20–29)
Calcium: 8.8 mg/dL (ref 8.7–10.3)
Creatinine, Ser: 1.29 mg/dL — ABNORMAL HIGH (ref 0.57–1.00)
GFR calc non Af Amer: 40 mL/min/{1.73_m2} — ABNORMAL LOW (ref >59)
GFR, EST AFRICAN AMERICAN: 46 mL/min/{1.73_m2} — AB (ref >59)
Glucose: 367 mg/dL — ABNORMAL HIGH (ref 65–99)
Potassium: 4.3 mmol/L (ref 3.5–5.2)
Sodium: 140 mmol/L (ref 134–144)

## 2018-03-09 MED ORDER — TRAMADOL HCL 50 MG PO TABS: 50.0000 mg | ORAL_TABLET | Freq: Two times a day (BID) | ORAL | 0 refills | Status: DC | PRN

## 2018-03-10 DIAGNOSIS — M25551 Pain in right hip: Secondary | ICD-10-CM | POA: Insufficient documentation

## 2018-03-10 NOTE — Assessment & Plan Note (Signed)
Blood pressure is well controlled today.  Recommended to continue low-salt diet and avoid canned foods as possible.  -Continue Lasix 20 mg daily along with K-Dur. -BMP today

## 2018-03-10 NOTE — Assessment & Plan Note (Addendum)
Patient complains of right thigh and right hip (no lateral aspect) when standing.  It is severe pain and interfere with standing and walking sometimes.  On exam, No local tenderness on hip. Right knee is intact. -Hip X ray for osteoarthritis/bony abnormality-->Normal-appearing right hip. -Trammadol 50 mg BID PRN

## 2018-03-10 NOTE — Assessment & Plan Note (Signed)
Patient comes to clinic due to bilateral lower extremity edema.  Denies shortness of breath, PND or weight gain. On exam no other evidence of volume overload to be concerning for CHF exacerbation.  (Lung exam is clear, JVP is normal, weight has been the same as last visit at 159 LB). 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  And hair lower extremity edema is likely to be due to venous insufficiency. We cannot recommend compression stocking due to having history of peripheral artery disease.  Gave reassurance and explained the etiology.  Recommended leg elevation.

## 2018-03-11 NOTE — Progress Notes (Signed)
I saw and evaluated the patient.  I personally confirmed the key portions of Dr. Darcey Nora history and exam and reviewed pertinent patient test results.  The assessment, diagnosis, and plan were formulated together and I agree with the documentation in the resident's note.  The right hip X-ray was unremarkable, although clinical symptoms and radiographic findings do not always correlate.  In addition, there was point tenderness over the right greater trochanteric bursa.  Although this finding may be consistent with a right greater trochanteric bursitis, the radiation down to the anterior right thigh and the fact there was no discomfort when lying on her right side argued against it being a bursitis.  That said, this should remain in the differential if there is no relief from the tramadol.  With regards to the mild LE edema, it improves overnight and worsens throughout the day.  This is very suggestive of a chronic venous insufficiency.  Unfortunately, she does have significant peripheral arterial disease and thus compression stockings are contraindicated.  We therefore must rely on lasix to keep the edema in check to allow safer ambulation.

## 2018-03-14 ENCOUNTER — Telehealth: Payer: Self-pay | Admitting: *Deleted

## 2018-03-14 ENCOUNTER — Other Ambulatory Visit (INDEPENDENT_AMBULATORY_CARE_PROVIDER_SITE_OTHER): Payer: Medicare Other

## 2018-03-14 DIAGNOSIS — D500 Iron deficiency anemia secondary to blood loss (chronic): Secondary | ICD-10-CM | POA: Diagnosis not present

## 2018-03-14 DIAGNOSIS — K31819 Angiodysplasia of stomach and duodenum without bleeding: Secondary | ICD-10-CM

## 2018-03-14 DIAGNOSIS — I780 Hereditary hemorrhagic telangiectasia: Secondary | ICD-10-CM | POA: Diagnosis not present

## 2018-03-14 LAB — SAMPLE TO BLOOD BANK

## 2018-03-14 LAB — CBC WITH DIFFERENTIAL/PLATELET
Abs Immature Granulocytes: 0.03 10*3/uL (ref 0.00–0.07)
Basophils Absolute: 0 10*3/uL (ref 0.0–0.1)
Basophils Relative: 1 %
Eosinophils Absolute: 0.3 10*3/uL (ref 0.0–0.5)
Eosinophils Relative: 4 %
HCT: 29.2 % — ABNORMAL LOW (ref 36.0–46.0)
Hemoglobin: 9 g/dL — ABNORMAL LOW (ref 12.0–15.0)
Immature Granulocytes: 0 %
Lymphocytes Relative: 7 %
Lymphs Abs: 0.5 10*3/uL — ABNORMAL LOW (ref 0.7–4.0)
MCH: 30.5 pg (ref 26.0–34.0)
MCHC: 30.8 g/dL (ref 30.0–36.0)
MCV: 99 fL (ref 80.0–100.0)
MONO ABS: 0.6 10*3/uL (ref 0.1–1.0)
Monocytes Relative: 9 %
Neutro Abs: 5.2 10*3/uL (ref 1.7–7.7)
Neutrophils Relative %: 79 %
Platelets: 177 10*3/uL (ref 150–400)
RBC: 2.95 MIL/uL — ABNORMAL LOW (ref 3.87–5.11)
RDW: 14.7 % (ref 11.5–15.5)
WBC: 6.7 10*3/uL (ref 4.0–10.5)
nRBC: 0 % (ref 0.0–0.2)

## 2018-03-14 NOTE — Telephone Encounter (Signed)
-----   Message from [name], MD sent at 03/14/2018 12:44 PM EST ----- Call pt: Hb 9; no blood this week - probably need next week

## 2018-03-14 NOTE — Telephone Encounter (Signed)
Pt called / informed  "Hb 9; no blood this week - probably need next week." per Dr [name].  Voiced understanding.Stated thank-you for calling.

## 2018-03-15 ENCOUNTER — Telehealth: Payer: Self-pay | Admitting: Internal Medicine

## 2018-03-15 NOTE — Telephone Encounter (Signed)
Lm for rtc 

## 2018-03-15 NOTE — Telephone Encounter (Signed)
Pt daughter would like the nurse to call her, to help her give her mom her insulin shot. Pls call [phone]

## 2018-03-19 ENCOUNTER — Other Ambulatory Visit: Payer: Self-pay | Admitting: Cardiology

## 2018-03-21 ENCOUNTER — Other Ambulatory Visit: Payer: Self-pay | Admitting: Cardiology

## 2018-03-21 ENCOUNTER — Other Ambulatory Visit (INDEPENDENT_AMBULATORY_CARE_PROVIDER_SITE_OTHER): Payer: Medicare Other

## 2018-03-21 ENCOUNTER — Telehealth: Payer: Self-pay | Admitting: *Deleted

## 2018-03-21 ENCOUNTER — Ambulatory Visit: Payer: Medicare Other | Admitting: Pharmacist

## 2018-03-21 DIAGNOSIS — I780 Hereditary hemorrhagic telangiectasia: Secondary | ICD-10-CM | POA: Diagnosis not present

## 2018-03-21 DIAGNOSIS — E1165 Type 2 diabetes mellitus with hyperglycemia: Secondary | ICD-10-CM

## 2018-03-21 DIAGNOSIS — D500 Iron deficiency anemia secondary to blood loss (chronic): Secondary | ICD-10-CM

## 2018-03-21 DIAGNOSIS — K31819 Angiodysplasia of stomach and duodenum without bleeding: Secondary | ICD-10-CM

## 2018-03-21 DIAGNOSIS — Z794 Long term (current) use of insulin: Principal | ICD-10-CM

## 2018-03-21 LAB — CBC WITH DIFFERENTIAL/PLATELET
Abs Immature Granulocytes: 0.04 10*3/uL (ref 0.00–0.07)
Basophils Absolute: 0 10*3/uL (ref 0.0–0.1)
Basophils Relative: 1 %
Eosinophils Absolute: 0.1 10*3/uL (ref 0.0–0.5)
Eosinophils Relative: 2 %
HCT: 28.7 % — ABNORMAL LOW (ref 36.0–46.0)
Hemoglobin: 9.1 g/dL — ABNORMAL LOW (ref 12.0–15.0)
Immature Granulocytes: 1 %
Lymphocytes Relative: 5 %
Lymphs Abs: 0.3 10*3/uL — ABNORMAL LOW (ref 0.7–4.0)
MCH: 30.4 pg (ref 26.0–34.0)
MCHC: 31.7 g/dL (ref 30.0–36.0)
MCV: 96 fL (ref 80.0–100.0)
Monocytes Absolute: 0.5 10*3/uL (ref 0.1–1.0)
Monocytes Relative: 8 %
NEUTROS ABS: 5.5 10*3/uL (ref 1.7–7.7)
Neutrophils Relative %: 83 %
Platelets: 189 10*3/uL (ref 150–400)
RBC: 2.99 MIL/uL — ABNORMAL LOW (ref 3.87–5.11)
RDW: 13.8 % (ref 11.5–15.5)
WBC: 6.5 10*3/uL (ref 4.0–10.5)
nRBC: 0 % (ref 0.0–0.2)

## 2018-03-21 LAB — SAMPLE TO BLOOD BANK

## 2018-03-21 LAB — FERRITIN: Ferritin: 12 ng/mL (ref 11–307)

## 2018-03-21 MED ORDER — FUROSEMIDE 20 MG PO TABS: 20.0000 mg | ORAL_TABLET | Freq: Every day | ORAL | 3 refills | Status: DC

## 2018-03-21 NOTE — Telephone Encounter (Signed)
Called pt - talked to [name]; informed "blood count stable (@ 9.1)- no transfusion this week! " per Dr [name]. Stated "good news" and he will tell Mrs. Fritchman.

## 2018-03-21 NOTE — Telephone Encounter (Signed)
-----   Message from [name], MD sent at 03/21/2018 12:59 PM EST ----- Call pt: blood count stable - no transfusion this week!

## 2018-03-24 NOTE — Telephone Encounter (Signed)
Lm for rtc 

## 2018-03-25 NOTE — Progress Notes (Signed)
S: [name] is a 78 y.o. female reports to clinical pharmacist appointment for diabetes medication management.  Allergies  Allergen Reactions  . Aspirin Nausea And Vomiting   Medication Sig  augmented betamethasone dipropionate (DIPROLENE-AF) 0.05 % cream Apply 1 application topically daily.  b complex vitamins capsule Take 1 capsule by mouth daily.  calcium citrate-vitamin D (CITRACAL+D) 315-200 MG-UNIT tablet Take 1 tablet by mouth 2 (two) times daily.  cetaphil (CETAPHIL) lotion Apply 1 application topically as needed for dry skin.  donepezil (ARICEPT) 10 MG tablet Take 1 tablet (10 mg total) by mouth at bedtime.  Dulaglutide (TRULICITY) 3.15 QM/0.8QP SOPN Inject 0.75 mg into the skin once a week.  Elastic Bandages & Supports (MEDICAL COMPRESSION STOCKINGS) MISC Wear as much as possible while awake to reduce swelling  FERROCITE 324 MG TABS tablet TAKE 1 TABLET BY MOUTH TWICE DAILY  furosemide (LASIX) 20 MG tablet Take 1 tablet (20 mg total) by mouth daily.  glucose blood (ONETOUCH VERIO) test strip Use as instructed 4 times daily. E11.29, insulin requiring  insulin degludec (TRESIBA FLEXTOUCH) 100 UNIT/ML SOPN FlexTouch Pen Inject 0.2 mLs (20 Units total) into the skin every morning.  Insulin Pen Needle (B-D UF III MINI PEN NEEDLES) 31G X 5 MM MISC USE TO INJECT INSULIN 3 TIMES DAILY  lactulose (CHRONULAC) 10 GM/15ML solution TAKE 15 ML BY MOUTH TWO TIMES DAILY  levETIRAcetam (KEPPRA) 500 MG tablet Take 1 tablet (500 mg total) by mouth 2 (two) times daily.  memantine (NAMENDA) 10 MG tablet Take 1 tablet (10 mg total) by mouth 2 (two) times daily.  ONETOUCH DELICA LANCETS FINE MISC Check blood sugar up to 4 times a day  oxymetazoline (AFRIN) 0.05 % nasal spray Place 1 spray into both nostrils 2 (two) times daily as needed (Epistaxis).  pantoprazole (PROTONIX) 40 MG tablet TAKE 1 TABLET BY MOUTH ONCE DAILY  potassium chloride (K-DUR,KLOR-CON) 10 MEQ tablet TAKE 1 TABLET BY MOUTH ONCE  DAILY  pramipexole (MIRAPEX) 0.125 MG tablet TAKE 1 TABLET BY MOUTH ONCE DAILY  sodium chloride (OCEAN) 0.65 % SOLN nasal spray Place 1 spray into both nostrils as needed for congestion.  traMADol (ULTRAM) 50 MG tablet Take 1 tablet (50 mg total) by mouth every 12 (twelve) hours as needed for moderate pain.  triamcinolone ointment (KENALOG) 0.5 % APPLY 1 APPLICATION TOPICALLY TO RASH TWICE DAILY FOR ITCHING   Past Medical History:  Diagnosis Date  . Chronic anemia   . Chronic diastolic CHF (congestive heart failure) ([place]) 10/03/2013  . Chronic GI bleeding    [name] 11/29/2014  . Family history of anesthesia complication    "niece has a hard time coming out" (09/15/2012)  . Frequent nosebleeds    chronic  . Gastric AV malformation    [name] 11/29/2014  . GERD (gastroesophageal reflux disease)   . 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 History of blood transfusion "several"  . History of epistaxis   . HTN (hypertension), benign 03/02/2012  . Hyperlipidemia   . Iron deficiency anemia    chronic infusions"  . Lichen planus    Both lower extremities  . 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 Overgrown toenails 03/18/2017  . Pneumonia 1990's X 2  . Pulmonary HTN ([place]) 04/01/2017   PASP 38mmHg on echo 09/2016  . Seizures ([place]) 09/2014  . Symptomatic anemia 11/29/2014  . Telangiectasia    Gastric   . Type II diabetes mellitus ([place])  insulin requiring.   Social History   Socioeconomic History  . Marital status: Married    Spouse name: [name]  . Number of children: 0  . Years of education: 9  . Highest education level: Not on file  Occupational History  . Occupation: Retired Systems developer: RETIRED  Social Needs  . Financial resource strain: Not on file  . Food insecurity:    Worry: Not on file    Inability: Not on file  . Transportation needs:    Medical: Not on file    Non-medical: Not on file  Tobacco Use  . Smoking status:  Former Smoker    Packs/day: 1.00    Years: 20.00    Pack years: 20.00    Types: Cigarettes    Last attempt to quit: 02/10/1971    Years since quitting: 47.1  . Smokeless tobacco: Never Used  . Tobacco comment: 09/15/2012 "smoked 50-60 yr ago"  Substance and Sexual Activity  . Alcohol use: No    Alcohol/week: 0.0 standard drinks  . Drug use: No  . Sexual activity: Not on file  Lifestyle  . Physical activity:    Days per week: Not on file    Minutes per session: Not on file  . Stress: Not on file  Relationships  . Social connections:    Talks on phone: Not on file    Gets together: Not on file    Attends religious service: Not on file    Active member of club or organization: Not on file    Attends meetings of clubs or organizations: Not on file    Relationship status: Not on file  Other Topics Concern  . Not on file  Social History Narrative   Married, lives with husband, [name].  Ambulates without assistance.     Caffeine use: none   Family History  Problem Relation Age of Onset  . Stroke Father   . Breast cancer Other   . Malignant hyperthermia Neg Hx   . Seizures Neg Hx    O:    Component Value Date/Time   CHOL 141 11/29/2014 1026   HDL 31 (L) 11/29/2014 1026   LDLCALC 82 11/29/2014 1026   TRIG 141 11/29/2014 1026   GLUCOSE 367 (H) 03/08/2018 1107   GLUCOSE 75 12/05/2017 0250   GLUCOSE 156 (H) 04/04/2014 1019   GLUCOSE 152 (H) 12/28/2011 0825   HGBA1C 5.9 (A) 07/15/2017 1127   HGBA1C 7.3 (H) 09/05/2014 0720   NA 140 03/08/2018 1107   NA 144 04/04/2014 1019   K 4.3 03/08/2018 1107   K 4.5 04/04/2014 1019   CL 103 03/08/2018 1107   CL 111 (H) 12/28/2011 0825   CO2 22 03/08/2018 1107   CO2 26 04/04/2014 1019   BUN 17 03/08/2018 1107   BUN 16.9 04/04/2014 1019   CREATININE 1.29 (H) 03/08/2018 1107   CREATININE 0.92 01/28/2015 0811   CREATININE 0.9 04/04/2014 1019   CALCIUM 8.8 03/08/2018 1107   CALCIUM 9.3 04/04/2014 1019   GFRNONAA 40 (L) 03/08/2018 1107    GFRAA 46 (L) 03/08/2018 1107   AST 24 12/04/2017 1230   AST 30 04/04/2014 1019   ALT 17 12/04/2017 1230   ALT 25 04/04/2014 1019   WBC 6.5 03/21/2018 1105   HGB 9.1 (L) 03/21/2018 1105   HGB 9.7 (L) 03/13/2015 1059   HCT 28.7 (L) 03/21/2018 1105   HCT 29.5 (L) 03/13/2015 1059   PLT 189 03/21/2018 1105   PLT  140 (L) 03/13/2015 1059   PLT 237 11/09/2014 1547   TSH 1.730 01/30/2015 1216   Ht Readings from Last 2 Encounters:  03/08/18 5\' 3"  (1.6 m)  03/02/18 5\' 2"  (1.575 m)   Wt Readings from Last 2 Encounters:  03/08/18 159 lb (72.1 kg)  03/02/18 159 lb (72.1 kg)   There is no height or weight on file to calculate BMI. BP Readings from Last 3 Encounters:  03/08/18 (!) 129/55  03/02/18 (!) 124/44  02/15/18 (!) 111/47    A/P:  Patient reports with family for diabetes medication management follow up per PCP. Patient reports only taking insulin degludec currently. She requests further education on dulaglutide. Dulaglutide was reviewed with the patient. Patient required more repetition regarding injection technique, was able to correctly demonstrate process using demo device on the 3rd try. Will review again at appointment next week.  An after visit summary was provided and patient advised to follow up in 1 week or sooner if any changes in condition or questions regarding medications arise.   The patient and family verbalized understanding of information provided by repeating back concepts discussed.

## 2018-03-28 ENCOUNTER — Encounter: Payer: Self-pay | Admitting: *Deleted

## 2018-03-28 ENCOUNTER — Ambulatory Visit (INDEPENDENT_AMBULATORY_CARE_PROVIDER_SITE_OTHER): Payer: Medicare Other | Admitting: Pharmacist

## 2018-03-28 ENCOUNTER — Other Ambulatory Visit (INDEPENDENT_AMBULATORY_CARE_PROVIDER_SITE_OTHER): Payer: Medicare Other

## 2018-03-28 DIAGNOSIS — I780 Hereditary hemorrhagic telangiectasia: Secondary | ICD-10-CM | POA: Diagnosis not present

## 2018-03-28 DIAGNOSIS — K31819 Angiodysplasia of stomach and duodenum without bleeding: Secondary | ICD-10-CM

## 2018-03-28 DIAGNOSIS — E1165 Type 2 diabetes mellitus with hyperglycemia: Secondary | ICD-10-CM

## 2018-03-28 DIAGNOSIS — Z794 Long term (current) use of insulin: Secondary | ICD-10-CM | POA: Diagnosis not present

## 2018-03-28 DIAGNOSIS — D500 Iron deficiency anemia secondary to blood loss (chronic): Secondary | ICD-10-CM

## 2018-03-28 LAB — CBC WITH DIFFERENTIAL/PLATELET
ABS IMMATURE GRANULOCYTES: 0.03 10*3/uL (ref 0.00–0.07)
BASOS PCT: 1 %
Basophils Absolute: 0.1 10*3/uL (ref 0.0–0.1)
Eosinophils Absolute: 0.4 10*3/uL (ref 0.0–0.5)
Eosinophils Relative: 6 %
HCT: 28 % — ABNORMAL LOW (ref 36.0–46.0)
Hemoglobin: 8.6 g/dL — ABNORMAL LOW (ref 12.0–15.0)
Immature Granulocytes: 0 %
Lymphocytes Relative: 9 %
Lymphs Abs: 0.6 10*3/uL — ABNORMAL LOW (ref 0.7–4.0)
MCH: 29.9 pg (ref 26.0–34.0)
MCHC: 30.7 g/dL (ref 30.0–36.0)
MCV: 97.2 fL (ref 80.0–100.0)
Monocytes Absolute: 0.6 10*3/uL (ref 0.1–1.0)
Monocytes Relative: 9 %
NEUTROS ABS: 5.1 10*3/uL (ref 1.7–7.7)
Neutrophils Relative %: 75 %
Platelets: 211 10*3/uL (ref 150–400)
RBC: 2.88 MIL/uL — ABNORMAL LOW (ref 3.87–5.11)
RDW: 13.8 % (ref 11.5–15.5)
WBC: 6.9 10*3/uL (ref 4.0–10.5)
nRBC: 0 % (ref 0.0–0.2)

## 2018-03-28 LAB — SAMPLE TO BLOOD BANK

## 2018-03-28 MED ORDER — DULAGLUTIDE 1.5 MG/0.5ML ~~LOC~~ SOAJ: 1.5000 mg | SUBCUTANEOUS | 3 refills | Status: DC

## 2018-03-28 MED ORDER — INSULIN DEGLUDEC 100 UNIT/ML ~~LOC~~ SOPN: 20.0000 [IU] | PEN_INJECTOR | SUBCUTANEOUS | 1 refills | Status: DC

## 2018-03-28 NOTE — Patient Instructions (Signed)
Patient educated about medication as defined in this encounter and verbalized understanding by repeating back instructions provided.   

## 2018-03-28 NOTE — Progress Notes (Addendum)
S: [name] is a 78 y.o. female reports to clinical pharmacist appointment for diabetes management per PCP.  Allergies  Allergen Reactions  . Aspirin Nausea And Vomiting   Medication Sig  augmented betamethasone dipropionate (DIPROLENE-AF) 0.05 % cream Apply 1 application topically daily.  b complex vitamins capsule Take 1 capsule by mouth daily.  calcium citrate-vitamin D (CITRACAL+D) 315-200 MG-UNIT tablet Take 1 tablet by mouth 2 (two) times daily.  cetaphil (CETAPHIL) lotion Apply 1 application topically as needed for dry skin.  donepezil (ARICEPT) 10 MG tablet Take 1 tablet (10 mg total) by mouth at bedtime.  Dulaglutide (TRULICITY) 1.5 HU/7.6LY SOPN Inject 1.5 mg into the skin once a week.  Elastic Bandages & Supports (MEDICAL COMPRESSION STOCKINGS) MISC Wear as much as possible while awake to reduce swelling  FERROCITE 324 MG TABS tablet TAKE 1 TABLET BY MOUTH TWICE DAILY  furosemide (LASIX) 20 MG tablet Take 1 tablet (20 mg total) by mouth daily.  glucose blood (ONETOUCH VERIO) test strip Use as instructed 4 times daily. E11.29, insulin requiring  insulin degludec (TRESIBA FLEXTOUCH) 100 UNIT/ML SOPN FlexTouch Pen Inject 0.2 mLs (20 Units total) into the skin every morning.  Insulin Pen Needle (B-D UF III MINI PEN NEEDLES) 31G X 5 MM MISC USE TO INJECT INSULIN 3 TIMES DAILY  lactulose (CHRONULAC) 10 GM/15ML solution TAKE 15 ML BY MOUTH TWO TIMES DAILY  levETIRAcetam (KEPPRA) 500 MG tablet Take 1 tablet (500 mg total) by mouth 2 (two) times daily.  memantine (NAMENDA) 10 MG tablet Take 1 tablet (10 mg total) by mouth 2 (two) times daily.  ONETOUCH DELICA LANCETS FINE MISC Check blood sugar up to 4 times a day  oxymetazoline (AFRIN) 0.05 % nasal spray Place 1 spray into both nostrils 2 (two) times daily as needed (Epistaxis).  pantoprazole (PROTONIX) 40 MG tablet TAKE 1 TABLET BY MOUTH ONCE DAILY  potassium chloride (K-DUR,KLOR-CON) 10 MEQ tablet TAKE 1 TABLET BY MOUTH ONCE  DAILY  pramipexole (MIRAPEX) 0.125 MG tablet TAKE 1 TABLET BY MOUTH ONCE DAILY  sodium chloride (OCEAN) 0.65 % SOLN nasal spray Place 1 spray into both nostrils as needed for congestion.  traMADol (ULTRAM) 50 MG tablet Take 1 tablet (50 mg total) by mouth every 12 (twelve) hours as needed for moderate pain.  triamcinolone ointment (KENALOG) 0.5 % APPLY 1 APPLICATION TOPICALLY TO RASH TWICE DAILY FOR ITCHING   Past Medical History:  Diagnosis Date  . Chronic anemia   . Chronic diastolic CHF (congestive heart failure) ([place]) 10/03/2013  . Chronic GI bleeding    [name] 11/29/2014  . Family history of anesthesia complication    "niece has a hard time coming out" (09/15/2012)  . Frequent nosebleeds    chronic  . Gastric AV malformation    [name] 11/29/2014  . GERD (gastroesophageal reflux disease)   . 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 History of blood transfusion "several"  . History of epistaxis   . HTN (hypertension), benign 03/02/2012  . Hyperlipidemia   . Iron deficiency anemia    chronic infusions"  . Lichen planus    Both lower extremities  . 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 Overgrown toenails 03/18/2017  . Pneumonia 1990's X 2  . Pulmonary HTN ([place]) 04/01/2017   PASP 39mmHg on echo 09/2016  . Seizures ([place]) 09/2014  . Symptomatic anemia 11/29/2014  . Telangiectasia    Gastric   . Type II diabetes mellitus ([place])  insulin requiring.   Social History   Socioeconomic History  . Marital status: Married    Spouse name: [name]  . Number of children: 0  . Years of education: 9  . Highest education level: Not on file  Occupational History  . Occupation: Retired Systems developer: RETIRED  Social Needs  . Financial resource strain: Not on file  . Food insecurity:    Worry: Not on file    Inability: Not on file  . Transportation needs:    Medical: Not on file    Non-medical: Not on file  Tobacco Use  . Smoking status:  Former Smoker    Packs/day: 1.00    Years: 20.00    Pack years: 20.00    Types: Cigarettes    Last attempt to quit: 02/10/1971    Years since quitting: 47.1  . Smokeless tobacco: Never Used  . Tobacco comment: 09/15/2012 "smoked 50-60 yr ago"  Substance and Sexual Activity  . Alcohol use: No    Alcohol/week: 0.0 standard drinks  . Drug use: No  . Sexual activity: Not on file  Lifestyle  . Physical activity:    Days per week: Not on file    Minutes per session: Not on file  . Stress: Not on file  Relationships  . Social connections:    Talks on phone: Not on file    Gets together: Not on file    Attends religious service: Not on file    Active member of club or organization: Not on file    Attends meetings of clubs or organizations: Not on file    Relationship status: Not on file  Other Topics Concern  . Not on file  Social History Narrative   Married, lives with husband, [name].  Ambulates without assistance.     Caffeine use: none   Family History  Problem Relation Age of Onset  . Stroke Father   . Breast cancer Other   . Malignant hyperthermia Neg Hx   . Seizures Neg Hx    O:    Component Value Date/Time   CHOL 141 11/29/2014 1026   HDL 31 (L) 11/29/2014 1026   LDLCALC 82 11/29/2014 1026   TRIG 141 11/29/2014 1026   GLUCOSE 367 (H) 03/08/2018 1107   GLUCOSE 75 12/05/2017 0250   GLUCOSE 156 (H) 04/04/2014 1019   GLUCOSE 152 (H) 12/28/2011 0825   HGBA1C 5.9 (A) 07/15/2017 1127   HGBA1C 7.3 (H) 09/05/2014 0720   NA 140 03/08/2018 1107   NA 144 04/04/2014 1019   K 4.3 03/08/2018 1107   K 4.5 04/04/2014 1019   CL 103 03/08/2018 1107   CL 111 (H) 12/28/2011 0825   CO2 22 03/08/2018 1107   CO2 26 04/04/2014 1019   BUN 17 03/08/2018 1107   BUN 16.9 04/04/2014 1019   CREATININE 1.29 (H) 03/08/2018 1107   CREATININE 0.92 01/28/2015 0811   CREATININE 0.9 04/04/2014 1019   CALCIUM 8.8 03/08/2018 1107   CALCIUM 9.3 04/04/2014 1019   GFRNONAA 40 (L) 03/08/2018 1107    GFRAA 46 (L) 03/08/2018 1107   AST 24 12/04/2017 1230   AST 30 04/04/2014 1019   ALT 17 12/04/2017 1230   ALT 25 04/04/2014 1019   WBC 6.9 03/28/2018 1115   HGB 8.6 (L) 03/28/2018 1115   HGB 9.7 (L) 03/13/2015 1059   HCT 28.0 (L) 03/28/2018 1115   HCT 29.5 (L) 03/13/2015 1059   PLT 211 03/28/2018 1115   PLT  140 (L) 03/13/2015 1059   PLT 237 11/09/2014 1547   TSH 1.730 01/30/2015 1216   Ht Readings from Last 2 Encounters:  03/08/18 5\' 3"  (1.6 m)  03/02/18 5\' 2"  (1.575 m)   Wt Readings from Last 2 Encounters:  03/08/18 159 lb (72.1 kg)  03/02/18 159 lb (72.1 kg)   There is no height or weight on file to calculate BMI. BP Readings from Last 3 Encounters:  03/08/18 (!) 129/55  03/02/18 (!) 124/44  02/15/18 (!) 111/47   A/P: Patient presents to clinic with husband and sister in-law who are involved in her care. Patient reports taking insulin degludec 22 units daily and dulaglutide 0.75 mg weekly. She correctly demonstrates injection technique and does present her home BG meter. SMBG results show BG average 211 mg/dL, within target range 30% of the time, above target range 68% of the time, below target range (hypoglycemic) 2% of the time. No sign or symptoms of concern reported today, patient states she is tolerating dulaglutide with no side effects (initiated 2 weeks ago).  To address hyperglycemia, patient was advised to increased dulaglutide to 1.5 mg weekly, and for hypoglycemia was advised to reduce degludec to 20 units daily. Patient reports that pharmacy will not refill insulin degludec due to being too soon. New prescriptions were sent to [facility] to clarify change in dosing, and sample provided in clinic. Further education was also provided on hyperglycemia management, and UA was obtained for microalb:cr level. Patient and family verbalized understanding, and an information handout was provided.  Follow up in 1 week.  15 minutes was spent with patient and family  during the visit, 50% on education, and 50% on assessment, plan, and coordination of care.

## 2018-03-29 ENCOUNTER — Other Ambulatory Visit: Payer: Self-pay

## 2018-03-29 ENCOUNTER — Ambulatory Visit (INDEPENDENT_AMBULATORY_CARE_PROVIDER_SITE_OTHER): Payer: Medicare Other | Admitting: Oncology

## 2018-03-29 ENCOUNTER — Encounter: Payer: Self-pay | Admitting: Oncology

## 2018-03-29 ENCOUNTER — Telehealth: Payer: Self-pay | Admitting: *Deleted

## 2018-03-29 VITALS — BP 123/33 | HR 74 | Temp 98.0°F | Ht 63.0 in | Wt 149.6 lb

## 2018-03-29 DIAGNOSIS — E1151 Type 2 diabetes mellitus with diabetic peripheral angiopathy without gangrene: Secondary | ICD-10-CM

## 2018-03-29 DIAGNOSIS — L280 Lichen simplex chronicus: Secondary | ICD-10-CM

## 2018-03-29 DIAGNOSIS — I771 Stricture of artery: Secondary | ICD-10-CM

## 2018-03-29 DIAGNOSIS — R040 Epistaxis: Secondary | ICD-10-CM

## 2018-03-29 DIAGNOSIS — D500 Iron deficiency anemia secondary to blood loss (chronic): Secondary | ICD-10-CM | POA: Diagnosis not present

## 2018-03-29 DIAGNOSIS — K31819 Angiodysplasia of stomach and duodenum without bleeding: Secondary | ICD-10-CM

## 2018-03-29 DIAGNOSIS — Z794 Long term (current) use of insulin: Secondary | ICD-10-CM

## 2018-03-29 DIAGNOSIS — Z79899 Other long term (current) drug therapy: Secondary | ICD-10-CM

## 2018-03-29 DIAGNOSIS — I780 Hereditary hemorrhagic telangiectasia: Secondary | ICD-10-CM

## 2018-03-29 DIAGNOSIS — Z886 Allergy status to analgesic agent status: Secondary | ICD-10-CM

## 2018-03-29 DIAGNOSIS — D1809 Hemangioma of other sites: Secondary | ICD-10-CM

## 2018-03-29 DIAGNOSIS — I872 Venous insufficiency (chronic) (peripheral): Secondary | ICD-10-CM

## 2018-03-29 DIAGNOSIS — Q2733 Arteriovenous malformation of digestive system vessel: Secondary | ICD-10-CM

## 2018-03-29 DIAGNOSIS — R011 Cardiac murmur, unspecified: Secondary | ICD-10-CM

## 2018-03-29 DIAGNOSIS — Z9889 Other specified postprocedural states: Secondary | ICD-10-CM

## 2018-03-29 DIAGNOSIS — I5030 Unspecified diastolic (congestive) heart failure: Secondary | ICD-10-CM

## 2018-03-29 DIAGNOSIS — I110 Hypertensive heart disease with heart failure: Secondary | ICD-10-CM | POA: Diagnosis not present

## 2018-03-29 LAB — MICROALBUMIN / CREATININE URINE RATIO
Creatinine, Urine: 284.2 mg/dL
Microalb/Creat Ratio: 5 mg/g creat (ref 0–29)
Microalbumin, Urine: 13.3 ug/mL

## 2018-03-29 MED ORDER — OXYCODONE-ACETAMINOPHEN 2.5-325 MG PO TABS: 1.0000 | ORAL_TABLET | Freq: Every evening | ORAL | 0 refills | Status: DC | PRN

## 2018-03-29 NOTE — Progress Notes (Signed)
Hematology and Oncology Follow Up Visit  [name] [id number] [birth year] 78 y.o. 03/29/2018 12:16 PM   Principle Diagnosis: Encounter Diagnoses  Name Primary?  . Gastric AVM Yes  . 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 Iron deficiency anemia due to chronic blood loss   . Epistaxis   . 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 She also has intermittent epistaxis. She requires frequent transfusions and parenteral iron supplementation. She does not tolerate oral iron. Transfusion requirements are becoming more frequent.  She requires 2 units of blood every 2-3 weeks. She last required hospital admission for urgent blood transfusion on March 02, 2016. The last time she required laser ablation was in August 2017. Only a single nonbleeding lesion was fulgurated at that time. Previous upper endoscopy on October 04, 2013 by Dr. Laurence Spates. A number of her gastric AVMs were actively bleeding. Some were cauterized. The largest was clipped. Despite laser fulguration and clipping of gastric AVMs, she continues to lose blood on a continuous basis. She had an episode of hematemesis and was admitted on Jul 05, 2014. A number of AVMs and previous scars from laser procedures were seen but no active bleeding at time of upper endoscopy on May 27. She had a another procedure on September 05, 2014. A large AVM along the greater curvature of the stomach was bleeding and required laser therapy. She continues to be transfusion dependent. I alternate parenteral iron with blood to try to keep her stable. It is an ongoing challenge.Bleeding is very sporadic but she usually winds up having to have a blood transfusion every 2-3 weeks.  Interim History: No change in her transfusion requirements every  2-3 weeks with intermittent parenteral iron supplementation needed as well.  Infrequent episodes of epistaxis.  Most of her blood losses from the GI tract.  Stools are always black.  No gross hematochezia recently. Her main complaint over the last year has been pain in her legs.  She has both venous and arterial insufficiency.  She has been variously tried on Mirapex and baclofen when it seemed like her pain was related more to muscle spasms but neither of these have helped her.  I recently discussed symptomatic treatment with her primary care who was okay with me starting low-dose Tylenol with oxycodone for her to take at night.   Medications: reviewed  Allergies:  Allergies  Allergen Reactions  . Aspirin Nausea And Vomiting    Review of Systems: See interim history Remaining ROS negative:   Physical Exam: Blood pressure (!) 123/33, pulse 74, temperature 98 F (36.7 C), temperature source Oral, height 5\' 3"  (1.6 m), weight 149 lb 9.6 oz (67.9 kg), SpO2 98 %. Wt Readings from Last 3 Encounters:  03/29/18 149 lb 9.6 oz (67.9 kg)  03/08/18 159 lb (72.1 kg)  03/02/18 159 lb (72.1 kg)     General appearance: Elderly African-American woman HENNT: She is wearing a wig.  Pharynx no erythema, exudate, mass, or ulcer.  Multiple small hemangiomas on the dorsum of her tongue.  No thyromegaly or thyroid nodules.  Arcus senilis. Lymph nodes: No cervical, supraclavicular, or axillary lymphadenopathy Breasts:  Lungs: Clear to auscultation, resonant to percussion throughout Heart: Regular rhythm, 2/6 aortic systolic murmur, no gallop, no rub, no click, no edema Abdomen: Soft, nontender, normal bowel sounds, no mass, no organomegaly  Extremities: No edema, no calf tenderness Musculoskeletal: no joint deformities GU:  Vascular: Carotid pulses 2+, Neurologic: Alert, oriented, PERRLA, cranial nerves grossly normal, motor strength 5 over 5, reflexes 1+ symmetric, upper body coordination normal, gait  normal, Skin: No rash or ecchymosis.  Small hemangiomas on the tips of some of her fingers.  Lab Results: CBC W/Diff    Component Value Date/Time   WBC 6.9 03/28/2018 1115   RBC 2.88 (L) 03/28/2018 1115   HGB 8.6 (L) 03/28/2018 1115   HGB 9.7 (L) 03/13/2015 1059   HCT 28.0 (L) 03/28/2018 1115   HCT 29.5 (L) 03/13/2015 1059   PLT 211 03/28/2018 1115   PLT 140 (L) 03/13/2015 1059   PLT 237 11/09/2014 1547   MCV 97.2 03/28/2018 1115   MCV 85.5 03/13/2015 1059   MCH 29.9 03/28/2018 1115   MCHC 30.7 03/28/2018 1115   RDW 13.8 03/28/2018 1115   RDW 21.7 (H) 03/13/2015 1059   LYMPHSABS 0.6 (L) 03/28/2018 1115   LYMPHSABS 0.4 (L) 03/13/2015 1059   MONOABS 0.6 03/28/2018 1115   MONOABS 0.3 03/13/2015 1059   EOSABS 0.4 03/28/2018 1115   EOSABS 0.3 03/13/2015 1059   BASOSABS 0.1 03/28/2018 1115   BASOSABS 0.0 03/13/2015 1059     Chemistry      Component Value Date/Time   NA 140 03/08/2018 1107   NA 144 04/04/2014 1019   K 4.3 03/08/2018 1107   K 4.5 04/04/2014 1019   CL 103 03/08/2018 1107   CL 111 (H) 12/28/2011 0825   CO2 22 03/08/2018 1107   CO2 26 04/04/2014 1019   BUN 17 03/08/2018 1107   BUN 16.9 04/04/2014 1019   CREATININE 1.29 (H) 03/08/2018 1107   CREATININE 0.92 01/28/2015 0811   CREATININE 0.9 04/04/2014 1019      Component Value Date/Time   CALCIUM 8.8 03/08/2018 1107   CALCIUM 9.3 04/04/2014 1019   ALKPHOS 53 12/04/2017 1230   ALKPHOS 140 04/04/2014 1019   AST 24 12/04/2017 1230   AST 30 04/04/2014 1019   ALT 17 12/04/2017 1230   ALT 25 04/04/2014 1019   BILITOT 0.6 12/04/2017 1230   BILITOT 0.3 10/04/2017 1120   BILITOT 0.43 04/04/2014 1019       Radiological Studies: Dg Hip Unilat With Pelvis 2-3 Views Right  Result Date: 03/08/2018 CLINICAL DATA:  Lateral right hip pain for a few weeks. Initial encounter. EXAM: DG HIP (WITH OR WITHOUT PELVIS) 2-3V RIGHT COMPARISON:  None. FINDINGS: There is no acute bony or joint abnormality. The hips appear  normal. There is mild convex left lumbar scoliosis. Lower lumbar degenerative change noted. IMPRESSION: Normal-appearing right hip. Partial visualization of convex left lumbar scoliosis and degenerative disease. Electronically Signed   By: Inge Rise M.D.   On: 03/08/2018 16:23    Impression:  1.  HHT  2.  Ongoing GI bleeding and intermittent epistaxis secondary to #1  3.  Transfusion dependent secondary to #1 and 2.  4.  Iron deficiency anemia secondary to #2  5.  Grade 2 diastolic cardiac dysfunction likely related to hypertension  6.  Intermittent claudication I am going to stop her Mirapex.  Trial of Tylox to use at night for symptomatic relief of her leg pain.  7.  Insulin-dependent diabetes  8.  Essential hypertension  9.  Benign aortic murmur  10.  Lichen planus chronic dermatitis lower extremities  I will transition her hematology care to Dr. Truitt Merle at the [facility] at  this time.  I will continue to manage her transfusion therapy for the next 5 weeks until she gets established with Dr.Feng.  CC: Patient Care Team: Delice Bison, DO as PCP - General [name], MD as PCP - Cardiology (Cardiology)   Murriel Hopper, MD, FACP  Hematology-Oncology/Internal Medicine     2/18/202012:16 PM

## 2018-03-29 NOTE — Telephone Encounter (Signed)
Pt is here today for her an appt with Dr [name].

## 2018-03-29 NOTE — Telephone Encounter (Signed)
-----   Message from [name], MD sent at 03/28/2018  2:04 PM EST ----- Call pt: Hb 8.6 No transfusion this week but definitely for next week - maybe we can line up a bed for next Mon or Tures; have her come in early for lab on Monday 2/24

## 2018-03-29 NOTE — Patient Instructions (Signed)
Continue to come in every Monday for blood checks  We made a referral to Dr Truitt Merle at the cancer center for your ongoing Hematology care. If you don't hear anything in the next 2 weeks, please call them at [phone]  It has been a pleasure working with you!  DrG

## 2018-03-31 ENCOUNTER — Other Ambulatory Visit: Payer: Self-pay | Admitting: Internal Medicine

## 2018-03-31 DIAGNOSIS — M25551 Pain in right hip: Secondary | ICD-10-CM

## 2018-04-01 NOTE — Telephone Encounter (Signed)
From reviewing recent visit notes, patient was prescribed a short-term dosing for acute hip pain. She was recently started on Percocet for chronic leg pain and would rather not have her on both of these at once due to greater risk of side effects.

## 2018-04-04 ENCOUNTER — Other Ambulatory Visit (INDEPENDENT_AMBULATORY_CARE_PROVIDER_SITE_OTHER): Payer: Medicare Other

## 2018-04-04 ENCOUNTER — Telehealth: Payer: Self-pay | Admitting: *Deleted

## 2018-04-04 DIAGNOSIS — I780 Hereditary hemorrhagic telangiectasia: Secondary | ICD-10-CM | POA: Diagnosis not present

## 2018-04-04 DIAGNOSIS — D500 Iron deficiency anemia secondary to blood loss (chronic): Secondary | ICD-10-CM | POA: Diagnosis not present

## 2018-04-04 DIAGNOSIS — K31819 Angiodysplasia of stomach and duodenum without bleeding: Secondary | ICD-10-CM

## 2018-04-04 LAB — CBC WITH DIFFERENTIAL/PLATELET
ABS IMMATURE GRANULOCYTES: 0.02 10*3/uL (ref 0.00–0.07)
Basophils Absolute: 0 10*3/uL (ref 0.0–0.1)
Basophils Relative: 1 %
Eosinophils Absolute: 0.3 10*3/uL (ref 0.0–0.5)
Eosinophils Relative: 4 %
HCT: 28.7 % — ABNORMAL LOW (ref 36.0–46.0)
Hemoglobin: 8.6 g/dL — ABNORMAL LOW (ref 12.0–15.0)
Immature Granulocytes: 0 %
Lymphocytes Relative: 9 %
Lymphs Abs: 0.5 10*3/uL — ABNORMAL LOW (ref 0.7–4.0)
MCH: 29.2 pg (ref 26.0–34.0)
MCHC: 30 g/dL (ref 30.0–36.0)
MCV: 97.3 fL (ref 80.0–100.0)
Monocytes Absolute: 0.6 10*3/uL (ref 0.1–1.0)
Monocytes Relative: 9 %
Neutro Abs: 4.7 10*3/uL (ref 1.7–7.7)
Neutrophils Relative %: 77 %
Platelets: 193 10*3/uL (ref 150–400)
RBC: 2.95 MIL/uL — AB (ref 3.87–5.11)
RDW: 14.3 % (ref 11.5–15.5)
WBC: 6.1 10*3/uL (ref 4.0–10.5)
nRBC: 0 % (ref 0.0–0.2)

## 2018-04-04 LAB — SAMPLE TO BLOOD BANK

## 2018-04-04 NOTE — Telephone Encounter (Signed)
-----   Message from [name], MD sent at 04/04/2018  3:20 PM EST ----- Call pt: blood count no change from last week. No transfusion needed this week

## 2018-04-04 NOTE — Telephone Encounter (Signed)
Call pt - talked to [name]; informed "blood count no change from last week (@ 8.6). No transfusion needed this week " per Dr [name]. Stated good news and he will tell Mrs Gowen.

## 2018-04-05 ENCOUNTER — Encounter: Payer: Self-pay | Admitting: Hematology

## 2018-04-05 ENCOUNTER — Telehealth: Payer: Self-pay | Admitting: Hematology

## 2018-04-05 NOTE — Telephone Encounter (Signed)
A new hem appt has been scheduled for the pt to see Dr. Burr Medico on 3/10 at 1015am. Letter mailed to the pt. Msg sent to [facility] at Dr. Azucena Freed office to notify the pt.

## 2018-04-08 ENCOUNTER — Encounter: Payer: Self-pay | Admitting: Podiatry

## 2018-04-08 ENCOUNTER — Ambulatory Visit (INDEPENDENT_AMBULATORY_CARE_PROVIDER_SITE_OTHER): Payer: Medicare Other | Admitting: Podiatry

## 2018-04-08 VITALS — BP 159/71 | HR 76

## 2018-04-08 DIAGNOSIS — L84 Corns and callosities: Secondary | ICD-10-CM

## 2018-04-08 DIAGNOSIS — N182 Chronic kidney disease, stage 2 (mild): Secondary | ICD-10-CM | POA: Diagnosis not present

## 2018-04-08 DIAGNOSIS — B351 Tinea unguium: Secondary | ICD-10-CM | POA: Diagnosis not present

## 2018-04-08 DIAGNOSIS — E0822 Diabetes mellitus due to underlying condition with diabetic chronic kidney disease: Secondary | ICD-10-CM | POA: Diagnosis not present

## 2018-04-08 DIAGNOSIS — M79675 Pain in left toe(s): Secondary | ICD-10-CM | POA: Diagnosis not present

## 2018-04-08 DIAGNOSIS — M79674 Pain in right toe(s): Secondary | ICD-10-CM

## 2018-04-08 DIAGNOSIS — Z794 Long term (current) use of insulin: Secondary | ICD-10-CM | POA: Diagnosis not present

## 2018-04-08 NOTE — Patient Instructions (Addendum)
Corns and Calluses Corns are small areas of thickened skin that occur on the top, sides, or tip of a toe. They contain a cone-shaped core with a point that can press on a nerve below. This causes pain.  Calluses are areas of thickened skin that can occur anywhere on the body, including the hands, fingers, palms, soles of the feet, and heels. Calluses are usually larger than corns. What are the causes? Corns and calluses are caused by rubbing (friction) or pressure, such as from shoes that are too tight or do not fit properly. What increases the risk? Corns are more likely to develop in people who have misshapen toes (toe deformities), such as hammer toes. Calluses can occur with friction to any area of the skin. They are more likely to develop in people who:  Work with their hands.  Wear shoes that fit poorly, are too tight, or are high-heeled.  Have toe deformities. What are the signs or symptoms? Symptoms of a corn or callus include:  A hard growth on the skin.  Pain or tenderness under the skin.  Redness and swelling.  Increased discomfort while wearing tight-fitting shoes, if your feet are affected. If a corn or callus becomes infected, symptoms may include:  Redness and swelling that gets worse.  Pain.  Fluid, blood, or pus draining from the corn or callus. How is this diagnosed? Corns and calluses may be diagnosed based on your symptoms, your medical history, and a physical exam. How is this treated? Treatment for corns and calluses may include:  Removing the cause of the friction or pressure. This may involve: ? Changing your shoes. ? Wearing shoe inserts (orthotics) or other protective layers in your shoes, such as a corn pad. ? Wearing gloves.  Applying medicine to the skin (topical medicine) to help soften skin in the hardened, thickened areas.  Removing layers of dead skin with a file to reduce the size of the corn or callus.  Removing the corn or callus with a  scalpel or laser.  Taking antibiotic medicines, if your corn or callus is infected.  Having surgery, if a toe deformity is the cause. Follow these instructions at home:   Take over-the-counter and prescription medicines only as told by your health care provider.  If you were prescribed an antibiotic, take it as told by your health care provider. Do not stop taking it even if your condition starts to improve.  Wear shoes that fit well. Avoid wearing high-heeled shoes and shoes that are too tight or too loose.  Wear any padding, protective layers, gloves, or orthotics as told by your health care provider.  Soak your hands or feet and then use a file or pumice stone to soften your corn or callus. Do this as told by your health care provider.  Check your corn or callus every day for symptoms of infection. Contact a health care provider if you:  Notice that your symptoms do not improve with treatment.  Have redness or swelling that gets worse.  Notice that your corn or callus becomes painful.  Have fluid, blood, or pus coming from your corn or callus.  Have new symptoms. Summary  Corns are small areas of thickened skin that occur on the top, sides, or tip of a toe.  Calluses are areas of thickened skin that can occur anywhere on the body, including the hands, fingers, palms, and soles of the feet. Calluses are usually larger than corns.  Corns and calluses are caused by   rubbing (friction) or pressure, such as from shoes that are too tight or do not fit properly.  Treatment may include wearing any padding, protective layers, gloves, or orthotics as told by your health care provider. This information is not intended to replace advice given to you by your health care provider. Make sure you discuss any questions you have with your health care provider. Document Released: 11/02/2003 Document Revised: 12/09/2016 Document Reviewed: 12/09/2016 Elsevier Interactive Patient Education   2019 [employer]  Onychomycosis/Fungal Toenails  WHAT IS IT? An infection that lies within the keratin of your nail plate that is caused by a fungus.  WHY ME? Fungal infections affect all ages, sexes, races, and creeds.  There may be many factors that predispose you to a fungal infection such as age, coexisting medical conditions such as diabetes, or an autoimmune disease; stress, medications, fatigue, genetics, etc.  Bottom line: fungus thrives in a warm, moist environment and your shoes offer such a location.  IS IT CONTAGIOUS? Theoretically, yes.  You do not want to share shoes, nail clippers or files with someone who has fungal toenails.  Walking around barefoot in the same room or sleeping in the same bed is unlikely to transfer the organism.  It is important to realize, however, that fungus can spread easily from one nail to the next on the same foot.  HOW DO WE TREAT THIS?  There are several ways to treat this condition.  Treatment may depend on many factors such as age, medications, pregnancy, liver and kidney conditions, etc.  It is best to ask your doctor which options are available to you.  1. No treatment.   Unlike many other medical concerns, you can live with this condition.  However for many people this can be a painful condition and may lead to ingrown toenails or a bacterial infection.  It is recommended that you keep the nails cut short to help reduce the amount of fungal nail. 2. Topical treatment.  These range from herbal remedies to prescription strength nail lacquers.  About 40-50% effective, topicals require twice daily application for approximately 9 to 12 months or until an entirely new nail has grown out.  The most effective topicals are medical grade medications available through physicians offices. 3. Oral antifungal medications.  With an 80-90% cure rate, the most common oral medication requires 3 to 4 months of therapy and stays in your system for a year as the new nail  grows out.  Oral antifungal medications do require blood work to make sure it is a safe drug for you.  A liver function panel will be performed prior to starting the medication and after the first month of treatment.  It is important to have the blood work performed to avoid any harmful side effects.  In general, this medication safe but blood work is required. 4. Laser Therapy.  This treatment is performed by applying a specialized laser to the affected nail plate.  This therapy is noninvasive, fast, and non-painful.  It is not covered by insurance and is therefore, out of pocket.  The results have been very good with a 80-95% cure rate.  [facility] is the only practice in the area to offer this therapy. 5. Permanent Nail Avulsion.  Removing the entire nail so that a new nail will not grow back. Diabetes Mellitus and Foot Care Foot care is an important part of your health, especially when you have diabetes. Diabetes may cause you to have problems  because of poor blood flow (circulation) to your feet and legs, which can cause your skin to:  Become thinner and drier.  Break more easily.  Heal more slowly.  Peel and crack. You may also have nerve damage (neuropathy) in your legs and feet, causing decreased feeling in them. This means that you may not notice minor injuries to your feet that could lead to more serious problems. Noticing and addressing any potential problems early is the best way to prevent future foot problems. How to care for your feet Foot hygiene  Wash your feet daily with warm water and mild soap. Do not use hot water. Then, pat your feet and the areas between your toes until they are completely dry. Do not soak your feet as this can dry your skin.  Trim your toenails straight across. Do not dig under them or around the cuticle. File the edges of your nails with an emery board or nail file.  Apply a moisturizing lotion or petroleum jelly to the skin on your feet and  to dry, brittle toenails. Use lotion that does not contain alcohol and is unscented. Do not apply lotion between your toes. Shoes and socks  Wear clean socks or stockings every day. Make sure they are not too tight. Do not wear knee-high stockings since they may decrease blood flow to your legs.  Wear shoes that fit properly and have enough cushioning. Always look in your shoes before you put them on to be sure there are no objects inside.  To break in new shoes, wear them for just a few hours a day. This prevents injuries on your feet. Wounds, scrapes, corns, and calluses  Check your feet daily for blisters, cuts, bruises, sores, and redness. If you cannot see the bottom of your feet, use a mirror or ask someone for help.  Do not cut corns or calluses or try to remove them with medicine.  If you find a minor scrape, cut, or break in the skin on your feet, keep it and the skin around it clean and dry. You may clean these areas with mild soap and water. Do not clean the area with peroxide, alcohol, or iodine.  If you have a wound, scrape, corn, or callus on your foot, look at it several times a day to make sure it is healing and not infected. Check for: ? Redness, swelling, or pain. ? Fluid or blood. ? Warmth. ? Pus or a bad smell. General instructions  Do not cross your legs. This may decrease blood flow to your feet.  Do not use heating pads or hot water bottles on your feet. They may burn your skin. If you have lost feeling in your feet or legs, you may not know this is happening until it is too late.  Protect your feet from hot and cold by wearing shoes, such as at the beach or on hot pavement.  Schedule a complete foot exam at least once a year (annually) or more often if you have foot problems. If you have foot problems, report any cuts, sores, or bruises to your health care provider immediately. Contact a health care provider if:  You have a medical condition that increases your  risk of infection and you have any cuts, sores, or bruises on your feet.  You have an injury that is not healing.  You have redness on your legs or feet.  You feel burning or tingling in your legs or feet.  You have pain  or cramps in your legs and feet.  Your legs or feet are numb.  Your feet always feel cold.  You have pain around a toenail. Get help right away if:  You have a wound, scrape, corn, or callus on your foot and: ? You have pain, swelling, or redness that gets worse. ? You have fluid or blood coming from the wound, scrape, corn, or callus. ? Your wound, scrape, corn, or callus feels warm to the touch. ? You have pus or a bad smell coming from the wound, scrape, corn, or callus. ? You have a fever. ? You have a red line going up your leg. Summary  Check your feet every day for cuts, sores, red spots, swelling, and blisters.  Moisturize feet and legs daily.  Wear shoes that fit properly and have enough cushioning.  If you have foot problems, report any cuts, sores, or bruises to your health care provider immediately.  Schedule a complete foot exam at least once a year (annually) or more often if you have foot problems. This information is not intended to replace advice given to you by your health care provider. Make sure you discuss any questions you have with your health care provider. Document Released: 01/24/2000 Document Revised: 03/10/2017 Document Reviewed: 02/28/2016 Elsevier Interactive Patient Education  2019 [employer].

## 2018-04-11 ENCOUNTER — Other Ambulatory Visit (INDEPENDENT_AMBULATORY_CARE_PROVIDER_SITE_OTHER): Payer: Medicare Other

## 2018-04-11 ENCOUNTER — Telehealth: Payer: Self-pay | Admitting: *Deleted

## 2018-04-11 ENCOUNTER — Other Ambulatory Visit: Payer: Self-pay | Admitting: Oncology

## 2018-04-11 DIAGNOSIS — I780 Hereditary hemorrhagic telangiectasia: Secondary | ICD-10-CM | POA: Diagnosis not present

## 2018-04-11 DIAGNOSIS — D500 Iron deficiency anemia secondary to blood loss (chronic): Secondary | ICD-10-CM

## 2018-04-11 DIAGNOSIS — K31819 Angiodysplasia of stomach and duodenum without bleeding: Secondary | ICD-10-CM | POA: Diagnosis not present

## 2018-04-11 LAB — CBC WITH DIFFERENTIAL/PLATELET
ABS IMMATURE GRANULOCYTES: 0.02 10*3/uL (ref 0.00–0.07)
Basophils Absolute: 0 10*3/uL (ref 0.0–0.1)
Basophils Relative: 1 %
Eosinophils Absolute: 0.5 10*3/uL (ref 0.0–0.5)
Eosinophils Relative: 9 %
HCT: 25 % — ABNORMAL LOW (ref 36.0–46.0)
Hemoglobin: 7.7 g/dL — ABNORMAL LOW (ref 12.0–15.0)
Immature Granulocytes: 0 %
LYMPHS ABS: 0.5 10*3/uL — AB (ref 0.7–4.0)
Lymphocytes Relative: 10 %
MCH: 29.6 pg (ref 26.0–34.0)
MCHC: 30.8 g/dL (ref 30.0–36.0)
MCV: 96.2 fL (ref 80.0–100.0)
Monocytes Absolute: 0.5 10*3/uL (ref 0.1–1.0)
Monocytes Relative: 9 %
Neutro Abs: 3.9 10*3/uL (ref 1.7–7.7)
Neutrophils Relative %: 71 %
Platelets: 188 10*3/uL (ref 150–400)
RBC: 2.6 MIL/uL — ABNORMAL LOW (ref 3.87–5.11)
RDW: 13.3 % (ref 11.5–15.5)
WBC: 5.4 10*3/uL (ref 4.0–10.5)
nRBC: 0 % (ref 0.0–0.2)

## 2018-04-11 LAB — SAMPLE TO BLOOD BANK

## 2018-04-11 NOTE — Telephone Encounter (Signed)
Call made to patient to make her aware of appt scheduled for Wed 04/13/18 at [facility] for blood infusion.Despina Hidden Cassady3/2/20204:22 PM

## 2018-04-11 NOTE — Addendum Note (Signed)
Addended by: Marcelino Duster on: 04/11/2018 04:16 PM   Modules accepted: Orders

## 2018-04-12 LAB — PREPARE RBC (CROSSMATCH)

## 2018-04-12 NOTE — Progress Notes (Signed)
[name]   [phone] [phone]   Clinic Follow up Note   Patient Care Team: Delice Bison, DO as PCP - General [name], MD as PCP - Cardiology (Cardiology) 04/19/2018  CHIEF COMPLAINT: Anemia due to chronic blood loss, HHT  HEMATOLOGY HISTORY 1.  Hereditary hemorrhagic telangiectasia (HHT), with epistaxis and frequent GI bleeding. Previously under care of Dr. [name] 2.  Anemia secondary to GI bleeding and iron deficiency, required blood transfusion every 3 to 4 weeks, IV Feraheme every 1 to 2 months   CURRENT THERAPY  1. Iv feraheme as needed if ferritin<50 2. Pending Avastin 5mg /kg every 2 weeks, starting in 1 week  3. Blood transfusion if Hg<8.0    INTERVAL HISTORY: [name] is a 78 y.o. female who is here for follow-up. She was previously seen by Dr. [name] and this is her first time seeing me.  Today, she is here with her family member and she is doing well.   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 She has been bleeding since 2000 for about 20 years. She also has intermittent epistaxis 2-3 times a week. If she puts pressure on her nose it slows down the bleeding. She has never gone to the hospital due to the epistaxis. She has constipation and takes Lactulose to have regular BMs. Every time she has a BM she experiences melena. She requires frequent transfusions and parenteral iron supplementation. She takes oral iron once a day. She requires 2 units of blood every 2-3 weeks.  In her upper endoscopy in 2015 a number of her gastric AVMs were actively bleeding. Some were cauterized. The largest was clipped. Despite laser fulguration and clipping of gastric AVMs, she continues to lose blood on a continuous basis.  She had an episode of hematemesis and was admitted on May 2016. A number of AVMs and previous scars from laser  procedures were seen but no active bleeding at time of upper endoscopy on May 2016. She had another procedure on September 05, 2014. A large AVM along the greater curvature of the stomach was bleeding and required laser therapy. She continues to be transfusion dependent. Dr. [name] gave her parenteral iron with blood to try to keep her stable. The last time she required laser ablation was in August 2017.  Only a single nonbleeding lesion was fulgurated at that time. The last time she was admitted to the ED was in 11/2017 due to GI bleeding.   She does not have any other family members who experience bleeding.    Pertinent positives and negatives of review of systems are listed and detailed within the above HPI.  REVIEW OF SYSTEMS:   Constitutional: Denies fevers, chills or abnormal weight loss Eyes: Denies blurriness of vision Ears, nose, mouth, throat, and face: Denies mucositis or sore throat Respiratory: Denies cough, dyspnea or wheezes Cardiovascular: Denies palpitation, chest discomfort or lower extremity swelling Gastrointestinal:  Denies nausea, heartburn or change in bowel habits Skin: Denies abnormal skin rashes Lymphatics: Denies new lymphadenopathy or easy bruising Neurological:Denies numbness, tingling or new weaknesses Behavioral/Psych: Mood is stable, no new changes  Musculoskeletal: (+) right leg pain  All other systems were reviewed with the patient and are negative.  MEDICAL HISTORY:  Past Medical History:  Diagnosis Date  . Chronic anemia   . Chronic diastolic CHF (congestive heart failure) ([place]) 10/03/2013  . Chronic GI bleeding    [name] 11/29/2014  .  Family history of anesthesia complication    "niece has a hard time coming out" (09/15/2012)  . Frequent nosebleeds    chronic  . Gastric AV malformation    [name] 11/29/2014  . GERD (gastroesophageal reflux disease)   . 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 History of blood transfusion "several"    . History of epistaxis   . HTN (hypertension), benign 03/02/2012  . Hyperlipidemia   . Iron deficiency anemia    chronic infusions"  . Lichen planus    Both lower extremities  . 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 Overgrown toenails 03/18/2017  . Pneumonia 1990's X 2  . Pulmonary HTN ([place]) 04/01/2017   PASP 95mmHg on echo 09/2016  . Seizures ([place]) 09/2014  . Symptomatic anemia 11/29/2014  . Telangiectasia    Gastric   . 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    SURGICAL HISTORY: Past Surgical History:  Procedure Laterality Date  . CATARACT EXTRACTION     "I think it was just one eye"  . ESOPHAGOGASTRODUODENOSCOPY  02/26/2011   Procedure: ESOPHAGOGASTRODUODENOSCOPY (EGD);  Surgeon: [name], MD;  Location: [name];  Service: Endoscopy;  Laterality: N/A;  . ESOPHAGOGASTRODUODENOSCOPY N/A 11/08/2012   Procedure: ESOPHAGOGASTRODUODENOSCOPY (EGD);  Surgeon: [name], MD;  Location: [name];  Service: Endoscopy;  Laterality: N/A;  . ESOPHAGOGASTRODUODENOSCOPY N/A 10/04/2013   Procedure: ESOPHAGOGASTRODUODENOSCOPY (EGD);  Surgeon: Winfield Cunas., MD;  Location: [name];  Service: Endoscopy;  Laterality: N/A;  with APC on stand-by  . ESOPHAGOGASTRODUODENOSCOPY N/A 07/06/2014   Procedure: ESOPHAGOGASTRODUODENOSCOPY (EGD);  Surgeon: Clarene Essex, MD;  Location: [name];  Service: Endoscopy;  Laterality: N/A;  . ESOPHAGOGASTRODUODENOSCOPY N/A 09/05/2014   Procedure: ESOPHAGOGASTRODUODENOSCOPY (EGD);  Surgeon: Laurence Spates, MD;  Location: [name];  Service: Endoscopy;  Laterality: N/A;  APC on standby to control bleeding  . ESOPHAGOGASTRODUODENOSCOPY N/A 11/29/2014   Procedure: ESOPHAGOGASTRODUODENOSCOPY (EGD);  Surgeon: [name], MD;  Location: [facility] ENDOSCOPY;  Service: Endoscopy;  Laterality: N/A;  . ESOPHAGOGASTRODUODENOSCOPY N/A 09/28/2015   Procedure: ESOPHAGOGASTRODUODENOSCOPY (EGD);  Surgeon: Clarene Essex, MD;   Location: [facility] ENDOSCOPY;  Service: Endoscopy;  Laterality: N/A;  . ESOPHAGOGASTRODUODENOSCOPY (EGD) WITH PROPOFOL N/A 12/04/2017   Procedure: ESOPHAGOGASTRODUODENOSCOPY (EGD) WITH PROPOFOL;  Surgeon: [name], MD;  Location: [place];  Service: Endoscopy;  Laterality: N/A;  . ESOPHAGOGASTRODUODENOSCOPY ENDOSCOPY  08/19/2006   with laser treatment  . HOT HEMOSTASIS N/A 07/06/2014   Procedure: HOT HEMOSTASIS (ARGON PLASMA COAGULATION/BICAP);  Surgeon: Clarene Essex, MD;  Location: [name];  Service: Endoscopy;  Laterality: N/A;  . HOT HEMOSTASIS N/A 09/28/2015   Procedure: HOT HEMOSTASIS (ARGON PLASMA COAGULATION/BICAP);  Surgeon: Clarene Essex, MD;  Location: [facility] ENDOSCOPY;  Service: Endoscopy;  Laterality: N/A;  . HOT HEMOSTASIS N/A 12/04/2017   Procedure: HOT HEMOSTASIS (ARGON PLASMA COAGULATION/BICAP);  Surgeon: [name], MD;  Location: [place];  Service: Endoscopy;  Laterality: N/A;  . NASAL HEMORRHAGE CONTROL     "for bleeding"   . SAVORY DILATION  02/26/2011   Procedure: SAVORY DILATION;  Surgeon: [name], MD;  Location: [name];  Service: Endoscopy;  Laterality: N/A;  c-arm needed  . SUBMUCOSAL INJECTION  12/04/2017   Procedure: SUBMUCOSAL INJECTION;  Surgeon: [name], MD;  Location: [facility] ENDOSCOPY;  Service: Endoscopy;;    I have reviewed the social history and family history with the patient and they are unchanged from previous note.  ALLERGIES:  is  allergic to aspirin.  MEDICATIONS:  Current Outpatient Medications  Medication Sig Dispense Refill  . augmented betamethasone dipropionate (DIPROLENE-AF) 0.05 % cream Apply 1 application topically daily.  2  . b complex vitamins capsule Take 1 capsule by mouth daily. 100 capsule 2  . cetaphil (CETAPHIL) lotion Apply 1 application topically as needed for dry skin. 236 mL 0  . donepezil (ARICEPT) 10 MG tablet Take 1 tablet (10 mg total) by mouth at bedtime. 90 tablet 4  . Dulaglutide (TRULICITY)  1.5 LK/4.4WN SOPN Inject 1.5 mg into the skin once a week. 4 pen 3  . Elastic Bandages & Supports (MEDICAL COMPRESSION STOCKINGS) MISC Wear as much as possible while awake to reduce swelling 2 each 0  . FERROCITE 324 MG TABS tablet TAKE 1 TABLET BY MOUTH TWICE DAILY 180 tablet 3  . furosemide (LASIX) 20 MG tablet Take 1 tablet (20 mg total) by mouth daily. 90 tablet 3  . glucose blood (ONETOUCH VERIO) test strip Use as instructed 4 times daily. E11.29, insulin requiring 400 each 3  . insulin degludec (TRESIBA FLEXTOUCH) 100 UNIT/ML SOPN FlexTouch Pen Inject 0.2 mLs (20 Units total) into the skin every morning. 2 pen 1  . Insulin Pen Needle (B-D UF III MINI PEN NEEDLES) 31G X 5 MM MISC USE TO INJECT INSULIN 3 TIMES DAILY 100 each 1  . lactulose (CHRONULAC) 10 GM/15ML solution TAKE 15 ML BY MOUTH TWO TIMES DAILY 473 mL 2  . levETIRAcetam (KEPPRA) 500 MG tablet Take 1 tablet (500 mg total) by mouth 2 (two) times daily. 180 tablet 4  . memantine (NAMENDA) 10 MG tablet Take 1 tablet (10 mg total) by mouth 2 (two) times daily. 180 tablet 4  . ONETOUCH DELICA LANCETS FINE MISC Check blood sugar up to 4 times a day 300 each 4  . oxymetazoline (AFRIN) 0.05 % nasal spray Place 1 spray into both nostrils 2 (two) times daily as needed (Epistaxis). 30 mL 0  . pantoprazole (PROTONIX) 40 MG tablet TAKE 1 TABLET BY MOUTH ONCE DAILY 180 tablet 0  . potassium chloride (K-DUR,KLOR-CON) 10 MEQ tablet TAKE 1 TABLET BY MOUTH ONCE DAILY 90 tablet 1  . sodium chloride (OCEAN) 0.65 % SOLN nasal spray Place 1 spray into both nostrils as needed for congestion. 1 Bottle 5  . traMADol (ULTRAM) 50 MG tablet Take 1 tablet (50 mg total) by mouth every 12 (twelve) hours as needed for moderate pain. 14 tablet 0  . triamcinolone ointment (KENALOG) 0.5 % APPLY 1 APPLICATION TOPICALLY TO RASH TWICE DAILY FOR ITCHING 15 g 1  . calcium citrate-vitamin D (CITRACAL+D) 315-200 MG-UNIT tablet Take 1 tablet by mouth 2 (two) times daily. 100  tablet 0  . oxycodone-acetaminophen (PERCOCET) 2.5-325 MG tablet Take 1-2 tablets by mouth at bedtime as needed for pain. (Patient not taking: Reported on 04/19/2018) 30 tablet 0   No current facility-administered medications for this visit.     PHYSICAL EXAMINATION: ECOG PERFORMANCE STATUS: 1 - Symptomatic but completely ambulatory  Vitals:   04/19/18 1010  BP: (!) 116/42  Pulse: 69  Resp: 17  Temp: 98.3 F (36.8 C)  SpO2: 99%   Filed Weights   04/19/18 1010  Weight: 149 lb 3.2 oz (67.7 kg)    GENERAL:alert, no distress and comfortable SKIN: skin color, texture, turgor are normal, no rashes or significant lesions, (+) eczema on both legs  EYES: normal, Conjunctiva are pink and non-injected, sclera clear OROPHARYNX:no exudate, no erythema and lips, buccal mucosa, (+)  bleeding on tongue NECK: supple, thyroid normal size, non-tender, without nodularity LYMPH:  no palpable lymphadenopathy in the cervical, axillary or inguinal LUNGS: clear to auscultation and percussion with normal breathing effort HEART: regular rate, no lower extremity edema (+) murmurs  ABDOMEN:abdomen soft, non-tender and normal bowel sounds Musculoskeletal:no cyanosis of digits and no clubbing  NEURO: alert & oriented x 3 with fluent speech, no focal motor/sensory deficits  LABORATORY DATA:  I have reviewed the data as listed CBC Latest Ref Rng & Units 04/19/2018 04/18/2018 04/11/2018  WBC 4.0 - 10.5 K/uL 7.1 6.3 5.4  Hemoglobin 12.0 - 15.0 g/dL 9.8(L) 9.7(L) 7.7(L)  Hematocrit 36.0 - 46.0 % 31.2(L) 30.4(L) 25.0(L)  Platelets 150 - 400 K/uL 149(L) 157 188     CMP Latest Ref Rng & Units 04/19/2018 03/08/2018 12/05/2017  Glucose 70 - 99 mg/dL 331(H) 367(H) 75  BUN 8 - 23 mg/dL 21 17 33(H)  Creatinine 0.44 - 1.00 mg/dL 1.66(H) 1.29(H) 1.51(H)  Sodium 135 - 145 mmol/L 142 140 142  Potassium 3.5 - 5.1 mmol/L 4.2 4.3 3.8  Chloride 98 - 111 mmol/L 104 103 110  CO2 22 - 32 mmol/L 27 22 29   Calcium 8.9 - 10.3  mg/dL 9.2 8.8 8.8(L)  Total Protein 6.5 - 8.1 g/dL 6.0(L) - -  Total Bilirubin 0.3 - 1.2 mg/dL 0.7 - -  Alkaline Phos 38 - 126 U/L 87 - -  AST 15 - 41 U/L 22 - -  ALT 0 - 44 U/L 18 - -      RADIOGRAPHIC STUDIES: I have personally reviewed the radiological images as listed and agreed with the findings in the report. No results found.   ASSESSMENT & PLAN:  [name] is a 78 y.o. female with history of  1. Hereditary hemorrhagic telangiectasia (HHT)), with epistaxis and frequent GI bleeding -She has mild epistaxis, and frequent GI bleeding, has required blood transfusion every 3 to 4 weeks.  Her last blood transfusion was on 04/13/2018  - Labs reviewed from yesterday,  Hg 9.7, RBC 3.24, HCT 30.4. She does not need a blood transfusion today  - I recommend her to try Avastin, which has been showed positive results in decreased HHT related bleeding.  The benefit and potential side effects, which include but not limited to, high blood pressure, hemorrhage, thrombosis, bowel perforation, proteinuria and renal dysfunction etc. she voiced good understanding, and agrees to proceed.  Her husband also agrees with the treatment.  - I gave her some reading material on Avastin and she agreed to start  - She will have IV iron and Avastin this week or next week   - F/u in 3 weeks before second dose Avastin   2. 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 Iron deficiency anemia secondary to #2 - She does not tolerate oral iron well but is on it -will continue IV Feraheme if ferritin less than 50  4. Memory Loss  - She takes Aricept and memantine -f/u with neurology   5. 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  Dr. [name] most recently prescribed percocet, I do not plan to refill  - She takes tramadol also  - F/u PCP   6. DM,  CHF - She is on insulin  - She monitors her blood glucose at home  -not very well controlled  -CHF stable clinically    Plan  - I provided reading material on Avastin  - Urine test today  - Iv feraheme and Avastin this or next week - Lab, f/u and Avastin in 3 weeks   No problem-specific Assessment & Plan notes found for this encounter.   Orders Placed This Encounter  Procedures  . Total Protein, Urine dipstick    Standing Status:   Standing    Number of Occurrences:   50    Standing Expiration Date:   04/19/2023  . CBC with Differential (Cancer Center Only)    Standing Status:   Standing    Number of Occurrences:   100    Standing Expiration Date:   04/19/2023  . CMP ([facility] only)    Standing Status:   Standing    Number of Occurrences:   100    Standing Expiration Date:   04/19/2023  . Ferritin    Standing Status:   Standing    Number of Occurrences:   100    Standing Expiration Date:   04/19/2023   All questions were answered. The patient knows to call the clinic with any problems, questions or concerns. No barriers to learning was detected. I spent 30 minutes counseling the patient face to face. The total time spent in the appointment was 40 minutes and more than 50% was on counseling and review of test results  I, Manson Allan am acting as scribe for Dr. Truitt Merle.  I have reviewed the above documentation for accuracy and completeness, and I agree with the above.     Truitt Merle, MD 04/19/2018

## 2018-04-13 ENCOUNTER — Telehealth: Payer: Self-pay | Admitting: *Deleted

## 2018-04-13 ENCOUNTER — Other Ambulatory Visit: Payer: Self-pay

## 2018-04-13 ENCOUNTER — Ambulatory Visit (HOSPITAL_COMMUNITY)
Admission: RE | Admit: 2018-04-13 | Discharge: 2018-04-13 | Disposition: A | Discharge status: Home / Self Care | Payer: Medicare Other | Source: Ambulatory Visit | Attending: Oncology | Admitting: Oncology

## 2018-04-13 DIAGNOSIS — K31819 Angiodysplasia of stomach and duodenum without bleeding: Secondary | ICD-10-CM | POA: Insufficient documentation

## 2018-04-13 DIAGNOSIS — D500 Iron deficiency anemia secondary to blood loss (chronic): Secondary | ICD-10-CM | POA: Insufficient documentation

## 2018-04-13 DIAGNOSIS — I780 Hereditary hemorrhagic telangiectasia: Secondary | ICD-10-CM | POA: Insufficient documentation

## 2018-04-13 MED ORDER — HEPARIN SOD (PORK) LOCK FLUSH 100 UNIT/ML IV SOLN
INTRAVENOUS | Status: AC
  Administered 2018-04-13: 500 [IU]
  Filled 2018-04-13: qty 5

## 2018-04-13 MED ORDER — SODIUM CHLORIDE 0.9% IV SOLUTION: Freq: Once | INTRAVENOUS | Status: DC

## 2018-04-13 NOTE — Telephone Encounter (Signed)
Called pt - talked to Mr Verastegui. Stated they are aware of her appt at the cancer ctr on March 10. Stated it's on the print out they have . She's currently receiving blood transfusion.

## 2018-04-14 LAB — TYPE AND SCREEN
ABO/RH(D): A POS
Antibody Screen: NEGATIVE
Unit division: 0
Unit division: 0

## 2018-04-14 LAB — BPAM RBC
Blood Product Expiration Date: 202003312359
Blood Product Expiration Date: 202003312359
ISSUE DATE / TIME: 202003040901
ISSUE DATE / TIME: 202003041117
UNIT TYPE AND RH: 6200
Unit Type and Rh: 6200

## 2018-04-17 NOTE — Progress Notes (Signed)
Subjective: [name] presents today referred by Delice Bison, DO with history of diabetes and cc of painful, discolored, thick toenails which interfere with daily activities. Duration of painful toenails has been months.  Pain is aggravated when wearing enclosed shoe gear. Pain is relieved with periodic professional debridement.  Past Medical History:  Diagnosis Date  . Chronic anemia   . Chronic diastolic CHF (congestive heart failure) ([place]) 10/03/2013  . Chronic GI bleeding    [name] 11/29/2014  . Family history of anesthesia complication    "niece has a hard time coming out" (09/15/2012)  . Frequent nosebleeds    chronic  . Gastric AV malformation    [name] 11/29/2014  . GERD (gastroesophageal reflux disease)   . 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 History of blood transfusion "several"  . History of epistaxis   . HTN (hypertension), benign 03/02/2012  . Hyperlipidemia   . Iron deficiency anemia    chronic infusions"  . Lichen planus    Both lower extremities  . 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 Overgrown toenails 03/18/2017  . Pneumonia 1990's X 2  . Pulmonary HTN ([place]) 04/01/2017   PASP 41mmHg on echo 09/2016  . Seizures ([place]) 09/2014  . Symptomatic anemia 11/29/2014  . Telangiectasia    Gastric   . 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    Patient Active Problem List   Diagnosis Date Noted  . Acute right hip pain 03/10/2018  . Hyperpigmented skin lesion 02/07/2018  . Leg weakness, bilateral 12/23/2017  . Hematemesis 12/04/2017  . Pain localized to upper abdomen 10/05/2017  . Lower extremity pain, bilateral 07/15/2017  . Pulmonary HTN ([place]) 04/01/2017  . Heart murmur 04/01/2017  . Osteopenia after menopause 01/13/2017  . Epistaxis 07/30/2016  . Venous insufficiency of both lower extremities 04/27/2016  . Anemia due to chronic blood loss   . Dry skin 02/17/2016  . Peripheral vascular disease  ([place]) 12/30/2015  . Pain in joint of right shoulder 10/22/2015  . PAF (paroxysmal atrial fibrillation) ([place]) 09/29/2015  . Lichen planus 51/70/0174  . Healthcare maintenance 06/20/2015  . Seborrheic keratosis 06/20/2015  . Memory loss 01/30/2015  . Simple febrile convulsions ([place]) 10/21/2014  . Hyperammonemia ([place]) 10/03/2014  . CKD (chronic kidney disease), stage II   . Chronic diastolic CHF (congestive heart failure) ([place]) 10/03/2013  . DM (diabetes mellitus), type 2, uncontrolled, with renal complications ([place]) 94/49/6759  . HTN (hypertension), benign 03/02/2012  . 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 CATARACT EXTRACTION     "I think it was just one eye"  . ESOPHAGOGASTRODUODENOSCOPY  02/26/2011   Procedure: ESOPHAGOGASTRODUODENOSCOPY (EGD);  Surgeon: [name], MD;  Location: [name];  Service: Endoscopy;  Laterality: N/A;  . ESOPHAGOGASTRODUODENOSCOPY N/A 11/08/2012   Procedure: ESOPHAGOGASTRODUODENOSCOPY (EGD);  Surgeon: [name], MD;  Location: [name];  Service: Endoscopy;  Laterality: N/A;  . ESOPHAGOGASTRODUODENOSCOPY N/A 10/04/2013   Procedure: ESOPHAGOGASTRODUODENOSCOPY (EGD);  Surgeon: Winfield Cunas., MD;  Location: [name];  Service: Endoscopy;  Laterality: N/A;  with APC on stand-by  . ESOPHAGOGASTRODUODENOSCOPY N/A 07/06/2014   Procedure: ESOPHAGOGASTRODUODENOSCOPY (EGD);  Surgeon: Clarene Essex, MD;  Location: [name];  Service: Endoscopy;  Laterality: N/A;  . ESOPHAGOGASTRODUODENOSCOPY N/A 09/05/2014   Procedure: ESOPHAGOGASTRODUODENOSCOPY (EGD);  Surgeon: Laurence Spates, MD;  Location: [name];  Service: Endoscopy;  Laterality: N/A;  APC on standby to control bleeding  . ESOPHAGOGASTRODUODENOSCOPY N/A 11/29/2014   Procedure: ESOPHAGOGASTRODUODENOSCOPY (EGD);  Surgeon: [name], MD;  Location: [facility] ENDOSCOPY;  Service:  Endoscopy;  Laterality: N/A;  . ESOPHAGOGASTRODUODENOSCOPY N/A 09/28/2015   Procedure: ESOPHAGOGASTRODUODENOSCOPY (EGD);  Surgeon: Clarene Essex, MD;  Location: [facility] ENDOSCOPY;  Service: Endoscopy;  Laterality: N/A;  . ESOPHAGOGASTRODUODENOSCOPY (EGD) WITH PROPOFOL N/A 12/04/2017   Procedure: ESOPHAGOGASTRODUODENOSCOPY (EGD) WITH PROPOFOL;  Surgeon: [name], MD;  Location: [facility];  Service: Endoscopy;  Laterality: N/A;  . ESOPHAGOGASTRODUODENOSCOPY ENDOSCOPY  08/19/2006   with laser treatment  . HOT HEMOSTASIS N/A 07/06/2014   Procedure: HOT HEMOSTASIS (ARGON PLASMA COAGULATION/BICAP);  Surgeon: Clarene Essex, MD;  Location: [name];  Service: Endoscopy;  Laterality: N/A;  . HOT HEMOSTASIS N/A 09/28/2015   Procedure: HOT HEMOSTASIS (ARGON PLASMA COAGULATION/BICAP);  Surgeon: Clarene Essex, MD;  Location: [facility] ENDOSCOPY;  Service: Endoscopy;  Laterality: N/A;  . HOT HEMOSTASIS N/A 12/04/2017   Procedure: HOT HEMOSTASIS (ARGON PLASMA COAGULATION/BICAP);  Surgeon: [name], MD;  Location: [place];  Service: Endoscopy;  Laterality: N/A;  . NASAL HEMORRHAGE CONTROL     "for bleeding"   . SAVORY DILATION  02/26/2011   Procedure: SAVORY DILATION;  Surgeon: [name], MD;  Location: [name];  Service: Endoscopy;  Laterality: N/A;  c-arm needed  . SUBMUCOSAL INJECTION  12/04/2017   Procedure: SUBMUCOSAL INJECTION;  Surgeon: [name], MD;  Location: [facility] ENDOSCOPY;  Service: Endoscopy;;     Current Outpatient Medications:  .  augmented betamethasone dipropionate (DIPROLENE-AF) 0.05 % cream, Apply 1 application topically daily., Disp: , Rfl: 2 .  b complex vitamins capsule, Take 1 capsule by mouth daily., Disp: 100 capsule, Rfl: 2 .  calcium citrate-vitamin D (CITRACAL+D) 315-200 MG-UNIT tablet, Take 1 tablet by mouth 2 (two) times daily., Disp: 100 tablet, Rfl: 0 .  cetaphil (CETAPHIL) lotion, Apply 1 application topically as needed for dry skin., Disp: 236 mL, Rfl:  0 .  donepezil (ARICEPT) 10 MG tablet, Take 1 tablet (10 mg total) by mouth at bedtime., Disp: 90 tablet, Rfl: 4 .  Dulaglutide (TRULICITY) 1.5 OA/4.1YS SOPN, Inject 1.5 mg into the skin once a week., Disp: 4 pen, Rfl: 3 .  Elastic Bandages & Supports (MEDICAL COMPRESSION STOCKINGS) MISC, Wear as much as possible while awake to reduce swelling, Disp: 2 each, Rfl: 0 .  FERROCITE 324 MG TABS tablet, TAKE 1 TABLET BY MOUTH TWICE DAILY, Disp: 180 tablet, Rfl: 3 .  furosemide (LASIX) 20 MG tablet, Take 1 tablet (20 mg total) by mouth daily., Disp: 90 tablet, Rfl: 3 .  glucose blood (ONETOUCH VERIO) test strip, Use as instructed 4 times daily. E11.29, insulin requiring, Disp: 400 each, Rfl: 3 .  insulin degludec (TRESIBA FLEXTOUCH) 100 UNIT/ML SOPN FlexTouch Pen, Inject 0.2 mLs (20 Units total) into the skin every morning., Disp: 2 pen, Rfl: 1 .  Insulin Pen Needle (B-D UF III MINI PEN NEEDLES) 31G X 5 MM MISC, USE TO INJECT INSULIN 3 TIMES DAILY, Disp: 100 each, Rfl: 1 .  lactulose (CHRONULAC) 10 GM/15ML solution, TAKE 15 ML BY MOUTH TWO TIMES DAILY, Disp: 473 mL, Rfl: 2 .  levETIRAcetam (KEPPRA) 500 MG tablet, Take 1 tablet (500 mg total) by mouth 2 (two) times daily., Disp: 180 tablet, Rfl: 4 .  memantine (NAMENDA) 10 MG tablet, Take 1 tablet (10 mg total) by mouth 2 (two) times daily., Disp: 180 tablet, Rfl: 4 .  ONETOUCH DELICA LANCETS  FINE MISC, Check blood sugar up to 4 times a day, Disp: 300 each, Rfl: 4 .  oxycodone-acetaminophen (PERCOCET) 2.5-325 MG tablet, Take 1-2 tablets by mouth at bedtime as needed for pain., Disp: 30 tablet, Rfl: 0 .  oxymetazoline (AFRIN) 0.05 % nasal spray, Place 1 spray into both nostrils 2 (two) times daily as needed (Epistaxis)., Disp: 30 mL, Rfl: 0 .  pantoprazole (PROTONIX) 40 MG tablet, TAKE 1 TABLET BY MOUTH ONCE DAILY, Disp: 180 tablet, Rfl: 0 .  potassium chloride (K-DUR) 10 MEQ tablet, , Disp: , Rfl:  .  potassium chloride (K-DUR,KLOR-CON) 10 MEQ tablet, TAKE  1 TABLET BY MOUTH ONCE DAILY, Disp: 90 tablet, Rfl: 1 .  sodium chloride (OCEAN) 0.65 % SOLN nasal spray, Place 1 spray into both nostrils as needed for congestion., Disp: 1 Bottle, Rfl: 5 .  traMADol (ULTRAM) 50 MG tablet, Take 1 tablet (50 mg total) by mouth every 12 (twelve) hours as needed for moderate pain., Disp: 14 tablet, Rfl: 0 .  triamcinolone ointment (KENALOG) 0.5 %, APPLY 1 APPLICATION TOPICALLY TO RASH TWICE DAILY FOR ITCHING, Disp: 15 g, Rfl: 1  Allergies  Allergen Reactions  . Aspirin Nausea And Vomiting    Social History   Occupational History  . Occupation: Retired Systems developer: RETIRED  Tobacco Use  . Smoking status: Former Smoker    Packs/day: 1.00    Years: 20.00    Pack years: 20.00    Types: Cigarettes    Last attempt to quit: 02/10/1971    Years since quitting: 47.2  . Smokeless tobacco: Never Used  . Tobacco comment: 09/15/2012 "smoked 50-60 yr ago"  Substance and Sexual Activity  . Alcohol use: No    Alcohol/week: 0.0 standard drinks  . Drug use: No  . Sexual activity: Not on file    Family History  Problem Relation Age of Onset  . Stroke Father   . Breast cancer Other   . Malignant hyperthermia Neg Hx   . Seizures Neg Hx     Immunization History  Administered Date(s) Administered  . Pneumococcal Polysaccharide-23 02/10/2003  . Td 02/10/2003   Review of systems: Positive Findings in bold print.  Constitutional:  chills, fatigue, fever, sweats, weight change Communication: Optometrist, sign Ecologist, hand writing, iPad/Android device Head: headaches, head injury Eyes: changes in vision, eye pain, glaucoma, cataracts, macular degeneration, diplopia, glare,  light sensitivity, eyeglasses or contacts, blindness Ears nose mouth throat: Hard of hearing, ringing in ears, deaf, sign language,  vertigo,   nosebleeds,  rhinitis,  cold sores, snoring, swollen glands Cardiovascular: HTN, edema, arrhythmia, pacemaker in place,  defibrillator in place,  chest pain/tightness, chronic anticoagulation, blood clot, heart failure, edema,  Peripheral Vascular: leg cramps, varicose veins, blood clots, lymphedema Respiratory:  difficulty breathing, denies congestion, SOB, wheezing, cough, emphysema Gastrointestinal: change in appetite or weight, abdominal pain, GERD, constipation, diarrhea, nausea, vomiting, vomiting blood, change in bowel habits, abdominal pain, jaundice, rectal bleeding, hemorrhoids, Genitourinary:  nocturia,  pain on urination,  blood in urine, Foley catheter, urinary urgency Musculoskeletal: uses mobility aid,  cramping, stiff joints, painful joints, decreased joint motion, fractures, OA, gout Skin: +changes in toenails, color change, dryness, itching, mole changes,  rash  Neurological: headaches, numbness in feet, paresthesias in feet, burning in feet, fainting,  seizures, change in speech. denies headaches, memory problems/poor historian, cerebral palsy, weakness, paralysis, Endocrine: diabetes, hypothyroidism, hyperthyroidism,  goiter, dry mouth, flushing, heat intolerance,  cold intolerance,  excessive thirst, denies polyuria,  nocturia Hematological:  easy bleeding, excessive bleeding, easy bruising, enlarged lymph nodes, on long term blood thinner, history of past transusions Allergy/immunological:  hives, eczema, frequent infections, multiple drug allergies, seasonal allergies, transplant recipient Psychiatric:  anxiety, depression, mood disorder, suicidal ideations, hallucinations   Objective: Vascular Examination: Capillary refill time immediate x 10 digits.  Dorsalis pedis and posterior tibial pulses present b/l.  No digital hair x 10 digits.  Skin temperature gradient WNL b/l.  Dermatological Examination: Skin with normal turgor, texture and tone b/l.  Toenails 1-5 b/l discolored, thick, dystrophic with subungual debris and pain with palpation to nailbeds due to thickness of  nails.  Hyperkeratotic lesion dorsal 5th digit PIPJ with tenderness to palpation. No edema, no erythema, no drainage, no flocculence.  Musculoskeletal: Muscle strength 5/5 to all LE muscle groups.  HAV with bunion b/l  Hammertoe right 5th digit.  Neurological: Sensation intact with 10 gram monofilament  Vibratory sensation intact.  Assessment: 1. Painful onychomycosis toenails 1-5 b/l  2. Corn dorsal 5th PIPJ right foot 3. NIDDM  Plan: 1. Discussed diabetic foot care principles. Literature dispensed on today. 2. Toenails 1-5 b/l were debrided in length and girth without iatrogenic bleeding. Corn(s) pared right 5th digit PIPJ with sterile scalpel blade without incident. 3. Patient to continue soft, supportive shoe gear 4. Patient to report any pedal injuries to medical professional immediately. 5. Follow up 3 months.  6. Patient/POA to call should there be a concern in the interim.

## 2018-04-18 ENCOUNTER — Telehealth: Payer: Self-pay | Admitting: *Deleted

## 2018-04-18 ENCOUNTER — Other Ambulatory Visit (INDEPENDENT_AMBULATORY_CARE_PROVIDER_SITE_OTHER): Payer: Medicare Other

## 2018-04-18 ENCOUNTER — Ambulatory Visit: Payer: Medicare Other | Admitting: Pharmacist

## 2018-04-18 DIAGNOSIS — D500 Iron deficiency anemia secondary to blood loss (chronic): Secondary | ICD-10-CM | POA: Diagnosis not present

## 2018-04-18 DIAGNOSIS — I780 Hereditary hemorrhagic telangiectasia: Secondary | ICD-10-CM | POA: Diagnosis not present

## 2018-04-18 DIAGNOSIS — K31819 Angiodysplasia of stomach and duodenum without bleeding: Secondary | ICD-10-CM | POA: Diagnosis not present

## 2018-04-18 DIAGNOSIS — IMO0002 Reserved for concepts with insufficient information to code with codable children: Secondary | ICD-10-CM

## 2018-04-18 DIAGNOSIS — E1129 Type 2 diabetes mellitus with other diabetic kidney complication: Secondary | ICD-10-CM

## 2018-04-18 DIAGNOSIS — E1165 Type 2 diabetes mellitus with hyperglycemia: Principal | ICD-10-CM

## 2018-04-18 LAB — CBC WITH DIFFERENTIAL/PLATELET
Abs Immature Granulocytes: 0.02 10*3/uL (ref 0.00–0.07)
Basophils Absolute: 0 10*3/uL (ref 0.0–0.1)
Basophils Relative: 1 %
EOS ABS: 0.4 10*3/uL (ref 0.0–0.5)
Eosinophils Relative: 6 %
HCT: 30.4 % — ABNORMAL LOW (ref 36.0–46.0)
Hemoglobin: 9.7 g/dL — ABNORMAL LOW (ref 12.0–15.0)
Immature Granulocytes: 0 %
Lymphocytes Relative: 8 %
Lymphs Abs: 0.5 10*3/uL — ABNORMAL LOW (ref 0.7–4.0)
MCH: 29.9 pg (ref 26.0–34.0)
MCHC: 31.9 g/dL (ref 30.0–36.0)
MCV: 93.8 fL (ref 80.0–100.0)
MONOS PCT: 8 %
Monocytes Absolute: 0.5 10*3/uL (ref 0.1–1.0)
Neutro Abs: 4.9 10*3/uL (ref 1.7–7.7)
Neutrophils Relative %: 77 %
Platelets: 157 10*3/uL (ref 150–400)
RBC: 3.24 MIL/uL — ABNORMAL LOW (ref 3.87–5.11)
RDW: 13.3 % (ref 11.5–15.5)
WBC: 6.3 10*3/uL (ref 4.0–10.5)
nRBC: 0 % (ref 0.0–0.2)

## 2018-04-18 LAB — SAMPLE TO BLOOD BANK

## 2018-04-18 NOTE — Telephone Encounter (Signed)
Called pt - talked to [name] ; informed pt's lab result " count good today (Hgb 9.7) - no transfusion this week" per Dr [name]. Stated he will inform pt and "thanks for calling".

## 2018-04-18 NOTE — Progress Notes (Signed)
S: [name] is a 78 y.o. female reports to clinical pharmacist appointment for Medication Management Review. Patient did not bring medication bottles. Patient is accompanied by husband + sister-in-law, who assist at home with medication management.  Allergies  Allergen Reactions  . Aspirin Nausea And Vomiting    Current Outpatient Medications:  .  augmented betamethasone dipropionate (DIPROLENE-AF) 0.05 % cream, Apply 1 application topically daily., Disp: , Rfl: 2 .  b complex vitamins capsule, Take 1 capsule by mouth daily., Disp: 100 capsule, Rfl: 2 .  calcium citrate-vitamin D (CITRACAL+D) 315-200 MG-UNIT tablet, Take 1 tablet by mouth 2 (two) times daily., Disp: 100 tablet, Rfl: 0 .  cetaphil (CETAPHIL) lotion, Apply 1 application topically as needed for dry skin., Disp: 236 mL, Rfl: 0 .  donepezil (ARICEPT) 10 MG tablet, Take 1 tablet (10 mg total) by mouth at bedtime., Disp: 90 tablet, Rfl: 4 .  Dulaglutide (TRULICITY) 1.5 GM/0.1UU SOPN, Inject 1.5 mg into the skin once a week., Disp: 4 pen, Rfl: 3 .  Elastic Bandages & Supports (MEDICAL COMPRESSION STOCKINGS) MISC, Wear as much as possible while awake to reduce swelling, Disp: 2 each, Rfl: 0 .  FERROCITE 324 MG TABS tablet, TAKE 1 TABLET BY MOUTH TWICE DAILY, Disp: 180 tablet, Rfl: 3 .  furosemide (LASIX) 20 MG tablet, Take 1 tablet (20 mg total) by mouth daily., Disp: 90 tablet, Rfl: 3 .  glucose blood (ONETOUCH VERIO) test strip, Use as instructed 4 times daily. E11.29, insulin requiring, Disp: 400 each, Rfl: 3 .  insulin degludec (TRESIBA FLEXTOUCH) 100 UNIT/ML SOPN FlexTouch Pen, Inject 0.2 mLs (20 Units total) into the skin every morning., Disp: 2 pen, Rfl: 1 .  Insulin Pen Needle (B-D UF III MINI PEN NEEDLES) 31G X 5 MM MISC, USE TO INJECT INSULIN 3 TIMES DAILY, Disp: 100 each, Rfl: 1 .  lactulose (CHRONULAC) 10 GM/15ML solution, TAKE 15 ML BY MOUTH TWO TIMES DAILY, Disp: 473 mL, Rfl: 2 .  levETIRAcetam (KEPPRA) 500 MG  tablet, Take 1 tablet (500 mg total) by mouth 2 (two) times daily., Disp: 180 tablet, Rfl: 4 .  memantine (NAMENDA) 10 MG tablet, Take 1 tablet (10 mg total) by mouth 2 (two) times daily., Disp: 180 tablet, Rfl: 4 .  ONETOUCH DELICA LANCETS FINE MISC, Check blood sugar up to 4 times a day, Disp: 300 each, Rfl: 4 .  oxycodone-acetaminophen (PERCOCET) 2.5-325 MG tablet, Take 1-2 tablets by mouth at bedtime as needed for pain., Disp: 30 tablet, Rfl: 0 .  oxymetazoline (AFRIN) 0.05 % nasal spray, Place 1 spray into both nostrils 2 (two) times daily as needed (Epistaxis)., Disp: 30 mL, Rfl: 0 .  pantoprazole (PROTONIX) 40 MG tablet, TAKE 1 TABLET BY MOUTH ONCE DAILY, Disp: 180 tablet, Rfl: 0 .  potassium chloride (K-DUR) 10 MEQ tablet, , Disp: , Rfl:  .  potassium chloride (K-DUR,KLOR-CON) 10 MEQ tablet, TAKE 1 TABLET BY MOUTH ONCE DAILY, Disp: 90 tablet, Rfl: 1 .  sodium chloride (OCEAN) 0.65 % SOLN nasal spray, Place 1 spray into both nostrils as needed for congestion., Disp: 1 Bottle, Rfl: 5 .  traMADol (ULTRAM) 50 MG tablet, Take 1 tablet (50 mg total) by mouth every 12 (twelve) hours as needed for moderate pain., Disp: 14 tablet, Rfl: 0 .  triamcinolone ointment (KENALOG) 0.5 %, APPLY 1 APPLICATION TOPICALLY TO RASH TWICE DAILY FOR ITCHING, Disp: 15 g, Rfl: 1   Past Medical History:  Diagnosis Date  . Chronic anemia   .  Chronic diastolic CHF (congestive heart failure) ([place]) 10/03/2013  . Chronic GI bleeding    [name] 11/29/2014  . Family history of anesthesia complication    "niece has a hard time coming out" (09/15/2012)  . Frequent nosebleeds    chronic  . Gastric AV malformation    [name] 11/29/2014  . GERD (gastroesophageal reflux disease)   . 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 History of blood transfusion "several"  . History of epistaxis   . HTN (hypertension), benign 03/02/2012  . Hyperlipidemia   . Iron deficiency anemia    chronic infusions"  . Lichen  planus    Both lower extremities  . 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 Overgrown toenails 03/18/2017  . Pneumonia 1990's X 2  . Pulmonary HTN ([place]) 04/01/2017   PASP 10mmHg on echo 09/2016  . Seizures ([place]) 09/2014  . Symptomatic anemia 11/29/2014  . Telangiectasia    Gastric   . 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   Social History   Socioeconomic History  . Marital status: Married    Spouse name: [name]  . Number of children: 0  . Years of education: 9  . Highest education level: Not on file  Occupational History  . Occupation: Retired Systems developer: RETIRED  Social Needs  . Financial resource strain: Not on file  . Food insecurity:    Worry: Not on file    Inability: Not on file  . Transportation needs:    Medical: Not on file    Non-medical: Not on file  Tobacco Use  . Smoking status: Former Smoker    Packs/day: 1.00    Years: 20.00    Pack years: 20.00    Types: Cigarettes    Last attempt to quit: 02/10/1971    Years since quitting: 47.2  . Smokeless tobacco: Never Used  . Tobacco comment: 09/15/2012 "smoked 50-60 yr ago"  Substance and Sexual Activity  . Alcohol use: No    Alcohol/week: 0.0 standard drinks  . Drug use: No  . Sexual activity: Not on file  Lifestyle  . Physical activity:    Days per week: Not on file    Minutes per session: Not on file  . Stress: Not on file  Relationships  . Social connections:    Talks on phone: Not on file    Gets together: Not on file    Attends religious service: Not on file    Active member of club or organization: Not on file    Attends meetings of clubs or organizations: Not on file    Relationship status: Not on file  Other Topics Concern  . Not on file  Social History Narrative   Married, lives with husband, [name].  Ambulates without assistance.     Caffeine use: none   Family History  Problem Relation Age of Onset  . Stroke Father   . Breast cancer Other   .  Malignant hyperthermia Neg Hx   . Seizures Neg Hx     O: CMP Latest Ref Rng & Units 03/08/2018 12/05/2017 12/04/2017  Glucose 65 - 99 mg/dL 367(H) 75 325(H)  BUN 8 - 27 mg/dL 17 33(H) 30(H)  Creatinine 0.57 - 1.00 mg/dL 1.29(H) 1.51(H) 1.54(H)  Sodium 134 - 144 mmol/L 140 142 139  Potassium 3.5 - 5.2 mmol/L 4.3 3.8 4.0  Chloride 96 - 106 mmol/L 103 110 105  CO2 20 -  29 mmol/L 22 29 25   Calcium 8.7 - 10.3 mg/dL 8.8 8.8(L) 9.3  Total Protein 6.5 - 8.1 g/dL - - 4.4(L)  Total Bilirubin 0.3 - 1.2 mg/dL - - 0.6  Alkaline Phos 38 - 126 U/L - - 53  AST 15 - 41 U/L - - 24  ALT 0 - 44 U/L - - 17   CBC Latest Ref Rng & Units 04/18/2018 04/11/2018 04/04/2018  WBC 4.0 - 10.5 K/uL 6.3 5.4 6.1  Hemoglobin 12.0 - 15.0 g/dL 9.7(L) 7.7(L) 8.6(L)  Hematocrit 36.0 - 46.0 % 30.4(L) 25.0(L) 28.7(L)  Platelets 150 - 400 K/uL 157 188 193   Ht Readings from Last 2 Encounters:  04/13/18 5\' 3"  (1.6 m)  03/29/18 5\' 3"  (1.6 m)   Wt Readings from Last 2 Encounters:  04/13/18 149 lb (67.6 kg)  03/29/18 149 lb 9.6 oz (67.9 kg)   There is no height or weight on file to calculate BMI. BP Readings from Last 3 Encounters:  04/13/18 (!) 132/44  04/08/18 (!) 159/71  03/29/18 (!) 123/33     A/P: A drug regimen assessment was performed, including review of allergies, interactions, disease-state management, dosing and immunization history. Medications were reviewed with the patient, including name, instructions, indication, goals of therapy, potential side effects, importance of adherence, and safe use.  Patient brought in Glucometer and patient data was printed out. Readings were found to be inconsistent over the past 30 days, one value of hypoglycemic was noted, multiple of hyperglycemic values throughout the timeframe.  Patient was concerned about frequency of meals and timing of blood glucose testing and asked what time frame she should test for blood glucose and it was directed to the patient that she should  test prior to meals and at night, consistently.  Patient had no issues obtaining medications, or any side effects from them.   An after visit summary was provided and patient advised to follow up in 1 week or sooner if any changes in condition or questions regarding medications arise.   The patient verbalized understanding of information provided by repeating back concepts discussed.

## 2018-04-18 NOTE — Telephone Encounter (Signed)
-----   Message from [name], MD sent at 04/18/2018 12:33 PM EDT ----- Call t: count good today - no transfusion this week

## 2018-04-19 ENCOUNTER — Inpatient Hospital Stay: Payer: Medicare Other

## 2018-04-19 ENCOUNTER — Inpatient Hospital Stay: Payer: Medicare Other | Attending: Hematology | Admitting: Hematology

## 2018-04-19 ENCOUNTER — Encounter: Payer: Self-pay | Admitting: Hematology

## 2018-04-19 ENCOUNTER — Telehealth: Payer: Self-pay | Admitting: Hematology

## 2018-04-19 ENCOUNTER — Other Ambulatory Visit: Payer: Self-pay

## 2018-04-19 VITALS — BP 116/42 | HR 69 | Temp 98.3°F | Resp 17 | Ht 63.0 in | Wt 149.2 lb

## 2018-04-19 DIAGNOSIS — D500 Iron deficiency anemia secondary to blood loss (chronic): Secondary | ICD-10-CM

## 2018-04-19 DIAGNOSIS — E785 Hyperlipidemia, unspecified: Secondary | ICD-10-CM

## 2018-04-19 DIAGNOSIS — I780 Hereditary hemorrhagic telangiectasia: Secondary | ICD-10-CM | POA: Insufficient documentation

## 2018-04-19 DIAGNOSIS — I10 Essential (primary) hypertension: Secondary | ICD-10-CM | POA: Diagnosis not present

## 2018-04-19 DIAGNOSIS — Z5112 Encounter for antineoplastic immunotherapy: Secondary | ICD-10-CM | POA: Diagnosis not present

## 2018-04-19 DIAGNOSIS — Z79899 Other long term (current) drug therapy: Secondary | ICD-10-CM | POA: Diagnosis not present

## 2018-04-19 DIAGNOSIS — I5032 Chronic diastolic (congestive) heart failure: Secondary | ICD-10-CM | POA: Insufficient documentation

## 2018-04-19 DIAGNOSIS — R413 Other amnesia: Secondary | ICD-10-CM | POA: Diagnosis not present

## 2018-04-19 DIAGNOSIS — Z794 Long term (current) use of insulin: Secondary | ICD-10-CM

## 2018-04-19 LAB — CBC WITH DIFFERENTIAL (CANCER CENTER ONLY)
Abs Immature Granulocytes: 0.01 10*3/uL (ref 0.00–0.07)
Basophils Absolute: 0 10*3/uL (ref 0.0–0.1)
Basophils Relative: 0 %
EOS ABS: 0.4 10*3/uL (ref 0.0–0.5)
Eosinophils Relative: 6 %
HCT: 31.2 % — ABNORMAL LOW (ref 36.0–46.0)
Hemoglobin: 9.8 g/dL — ABNORMAL LOW (ref 12.0–15.0)
Immature Granulocytes: 0 %
Lymphocytes Relative: 8 %
Lymphs Abs: 0.6 10*3/uL — ABNORMAL LOW (ref 0.7–4.0)
MCH: 29.8 pg (ref 26.0–34.0)
MCHC: 31.4 g/dL (ref 30.0–36.0)
MCV: 94.8 fL (ref 80.0–100.0)
Monocytes Absolute: 0.5 10*3/uL (ref 0.1–1.0)
Monocytes Relative: 8 %
Neutro Abs: 5.5 10*3/uL (ref 1.7–7.7)
Neutrophils Relative %: 78 %
PLATELETS: 149 10*3/uL — AB (ref 150–400)
RBC: 3.29 MIL/uL — ABNORMAL LOW (ref 3.87–5.11)
RDW: 13.3 % (ref 11.5–15.5)
WBC: 7.1 10*3/uL (ref 4.0–10.5)
nRBC: 0 % (ref 0.0–0.2)

## 2018-04-19 LAB — CMP (CANCER CENTER ONLY)
ALT: 18 U/L (ref 0–44)
ANION GAP: 11 (ref 5–15)
AST: 22 U/L (ref 15–41)
Albumin: 3.1 g/dL — ABNORMAL LOW (ref 3.5–5.0)
Alkaline Phosphatase: 87 U/L (ref 38–126)
BUN: 21 mg/dL (ref 8–23)
CO2: 27 mmol/L (ref 22–32)
Calcium: 9.2 mg/dL (ref 8.9–10.3)
Chloride: 104 mmol/L (ref 98–111)
Creatinine: 1.66 mg/dL — ABNORMAL HIGH (ref 0.44–1.00)
GFR, Est AFR Am: 34 mL/min — ABNORMAL LOW (ref >60)
GFR, Estimated: 29 mL/min — ABNORMAL LOW (ref >60)
Glucose, Bld: 331 mg/dL — ABNORMAL HIGH (ref 70–99)
POTASSIUM: 4.2 mmol/L (ref 3.5–5.1)
Sodium: 142 mmol/L (ref 135–145)
Total Bilirubin: 0.7 mg/dL (ref 0.3–1.2)
Total Protein: 6 g/dL — ABNORMAL LOW (ref 6.5–8.1)

## 2018-04-19 LAB — FERRITIN: Ferritin: 16 ng/mL (ref 11–307)

## 2018-04-19 LAB — TOTAL PROTEIN, URINE DIPSTICK: Protein, ur: NEGATIVE mg/dL

## 2018-04-19 NOTE — Telephone Encounter (Signed)
Scheduled appt per 3/10 los. ° °Printed calendar and avs. °

## 2018-04-20 ENCOUNTER — Telehealth: Payer: Self-pay

## 2018-04-20 NOTE — Telephone Encounter (Signed)
Patient unavailable, spoke with her husband, explained lab resutls per Dr. Ernestina Penna note, he states she has an appointment with PCP this Friday explained her blood glucose is very high.  He verbalized an understanding and states he will pass this along.

## 2018-04-20 NOTE — Telephone Encounter (Signed)
-----   Message from Truitt Merle, MD sent at 04/20/2018  8:03 AM EDT ----- Please let her know her lab results, urine protein (-), iron level low, she is scheduled to have feraheme and Avastin. BG high, she needs to f/u with PCP and monitor her glucose at home, thanks   [employer]  04/20/2018

## 2018-04-22 ENCOUNTER — Inpatient Hospital Stay: Payer: Medicare Other

## 2018-04-22 ENCOUNTER — Other Ambulatory Visit: Payer: Self-pay | Admitting: *Deleted

## 2018-04-22 ENCOUNTER — Other Ambulatory Visit: Payer: Self-pay

## 2018-04-22 VITALS — BP 111/31 | HR 76 | Temp 98.9°F | Resp 18

## 2018-04-22 DIAGNOSIS — D508 Other iron deficiency anemias: Secondary | ICD-10-CM

## 2018-04-22 DIAGNOSIS — I780 Hereditary hemorrhagic telangiectasia: Secondary | ICD-10-CM

## 2018-04-22 DIAGNOSIS — Z5112 Encounter for antineoplastic immunotherapy: Secondary | ICD-10-CM | POA: Diagnosis not present

## 2018-04-22 DIAGNOSIS — K31819 Angiodysplasia of stomach and duodenum without bleeding: Secondary | ICD-10-CM

## 2018-04-22 MED ORDER — HEPARIN SOD (PORK) LOCK FLUSH 100 UNIT/ML IV SOLN
500.0000 [IU] | Freq: Once | INTRAVENOUS | Status: AC
  Administered 2018-04-22: 500 [IU] via INTRAVENOUS
  Filled 2018-04-22: qty 5

## 2018-04-22 MED ORDER — SODIUM CHLORIDE 0.9% FLUSH
10.0000 mL | INTRAVENOUS | Status: DC | PRN
  Administered 2018-04-22: 10 mL via INTRAVENOUS
  Filled 2018-04-22: qty 10

## 2018-04-22 MED ORDER — SODIUM CHLORIDE 0.9 % IV SOLN
Freq: Once | INTRAVENOUS | Status: AC
  Administered 2018-04-22: 15:00:00 via INTRAVENOUS
  Filled 2018-04-22: qty 250

## 2018-04-22 MED ORDER — SODIUM CHLORIDE 0.9 % IV SOLN
510.0000 mg | Freq: Once | INTRAVENOUS | Status: AC
  Administered 2018-04-22: 510 mg via INTRAVENOUS
  Filled 2018-04-22: qty 17

## 2018-04-22 NOTE — Patient Instructions (Signed)

## 2018-04-25 ENCOUNTER — Telehealth: Payer: Self-pay

## 2018-04-25 ENCOUNTER — Other Ambulatory Visit: Payer: Medicare Other

## 2018-04-25 ENCOUNTER — Ambulatory Visit: Payer: Medicare Other | Admitting: Pharmacist

## 2018-04-25 ENCOUNTER — Other Ambulatory Visit: Payer: Self-pay

## 2018-04-25 DIAGNOSIS — IMO0002 Reserved for concepts with insufficient information to code with codable children: Secondary | ICD-10-CM

## 2018-04-25 DIAGNOSIS — E1165 Type 2 diabetes mellitus with hyperglycemia: Principal | ICD-10-CM

## 2018-04-25 DIAGNOSIS — E1129 Type 2 diabetes mellitus with other diabetic kidney complication: Secondary | ICD-10-CM

## 2018-04-25 NOTE — Telephone Encounter (Signed)
Patient called regarding her appointments for blood work, consulted with Dr. Burr Medico patient has appointments on 3/30 for lab, f/u and infusion. Dr. Burr Medico feels it is okay to wait until then unless patient does not feel well, has black tarry stools, or frank bleeding she would need to be seen sooner.  Patient and her husband verbalized an understanding.

## 2018-04-25 NOTE — Progress Notes (Signed)
Patient reports for DM follow up. Patient reports confusion regarding how to check BG at home. Provided education, advised patient to check prior to meals. No medication changes made today.

## 2018-05-02 ENCOUNTER — Other Ambulatory Visit: Payer: Medicare Other

## 2018-05-02 ENCOUNTER — Telehealth: Payer: Self-pay | Admitting: Pharmacist

## 2018-05-02 NOTE — Telephone Encounter (Signed)
Called patient; confirmed HIPAA identifiers. Discussed with patient blood glucometer and re-emphasized instructions. Patient was asking about refills on her medications, I said that [facility] should have them on file and they are able to refill them for her and if there is any problem with refills to contact [facility] and we will order more for her.  Tamela Gammon, PharmD 05/02/2018 8:37 AM PGY-1 Pharmacy Resident Direct Phone: [phone] Please check [email or web address] for unit-specific pharmacist phone numbers

## 2018-05-05 ENCOUNTER — Telehealth: Payer: Self-pay | Admitting: Pharmacist

## 2018-05-05 DIAGNOSIS — Z794 Long term (current) use of insulin: Principal | ICD-10-CM

## 2018-05-05 DIAGNOSIS — E1165 Type 2 diabetes mellitus with hyperglycemia: Secondary | ICD-10-CM

## 2018-05-05 MED ORDER — INSULIN DEGLUDEC 100 UNIT/ML ~~LOC~~ SOPN: 20.0000 [IU] | PEN_INJECTOR | SUBCUTANEOUS | 3 refills | Status: DC

## 2018-05-05 NOTE — Progress Notes (Signed)
[place]   [phone] [phone]   Clinic Follow up Note   Patient Care Team: Delice Bison, DO as PCP - General [name], MD as PCP - Cardiology (Cardiology)  Date of Service:  05/09/2018  CHIEF COMPLAINT: F/u of Anemia due to chronic blood loss, HHT   HEMATOLOGY HISTORY 1.  Hereditary hemorrhagic telangiectasia (HHT), with epistaxis and frequent GI bleeding. Previously under care of Dr. [name] 2.  Anemia secondary to GI bleeding and iron deficiency, required blood transfusion every 3 to 4 weeks, IV Feraheme every 1 to 2 months   CURRENT THERAPY  1. Iv feraheme as needed if ferritin<50 2. Avastin 5mg /kg every 2 weeks, starting 05/09/18  3. Blood transfusion if Hg<8.0   INTERVAL HISTORY:  [name] is here for a follow up of Anemia and HHT. She presents to the clinic today. She notes she feels stable she denies feeling tired, but notes it is hard for her too walk. Due to leg pain and cramping. She notes she was seen by a vein specialist and was instructed to walk 30 minutes daily. She notes after last IV iron she feels better. She notes still having black stool, she has a bowel movement about once daily. She feels she should have more bowel movements.  She notes she has been keeping track of her DM at home and can e go up to 293 and down to 104. She notes she lives with her sister-in-law and helps her take her medications.    REVIEW OF SYSTEMS:   Constitutional: Denies fevers, chills or abnormal weight loss Eyes: Denies blurriness of vision Ears, nose, mouth, throat, and face: Denies mucositis or sore throat Respiratory: Denies cough, dyspnea or wheezes Cardiovascular: Denies palpitation, chest discomfort or lower extremity swelling Gastrointestinal:  Denies nausea, heartburn or change in bowel habits Skin: Denies abnormal skin rashes MSK: Leg pain and cramping Lymphatics: Denies new lymphadenopathy or easy  bruising Neurological:Denies numbness, tingling or new weaknesses Behavioral/Psych: Mood is stable, no new changes  All other systems were reviewed with the patient and are negative.  MEDICAL HISTORY:  Past Medical History:  Diagnosis Date  . Chronic anemia   . Chronic diastolic CHF (congestive heart failure) ([place]) 10/03/2013  . Chronic GI bleeding    [name] 11/29/2014  . Family history of anesthesia complication    "niece has a hard time coming out" (09/15/2012)  . Frequent nosebleeds    chronic  . Gastric AV malformation    [name] 11/29/2014  . GERD (gastroesophageal reflux disease)   . 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 History of blood transfusion "several"  . History of epistaxis   . HTN (hypertension), benign 03/02/2012  . Hyperlipidemia   . Iron deficiency anemia    chronic infusions"  . Lichen planus    Both lower extremities  . 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 Overgrown toenails 03/18/2017  . Pneumonia 1990's X 2  . Pulmonary HTN ([place]) 04/01/2017   PASP 87mmHg on echo 09/2016  . Seizures ([place]) 09/2014  . Symptomatic anemia 11/29/2014  . Telangiectasia    Gastric   . 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    SURGICAL HISTORY: Past Surgical History:  Procedure Laterality Date  . CATARACT EXTRACTION     "I think it was just one eye"  . ESOPHAGOGASTRODUODENOSCOPY  02/26/2011   Procedure: ESOPHAGOGASTRODUODENOSCOPY (EGD);  Surgeon: Elyse Jarvis  [name] Plenty, MD;  Location: [name];  Service: Endoscopy;  Laterality: N/A;  . ESOPHAGOGASTRODUODENOSCOPY N/A 11/08/2012   Procedure: ESOPHAGOGASTRODUODENOSCOPY (EGD);  Surgeon: [name], MD;  Location: [name];  Service: Endoscopy;  Laterality: N/A;  . ESOPHAGOGASTRODUODENOSCOPY N/A 10/04/2013   Procedure: ESOPHAGOGASTRODUODENOSCOPY (EGD);  Surgeon: Winfield Cunas., MD;  Location: [name];  Service: Endoscopy;  Laterality: N/A;  with APC on stand-by  .  ESOPHAGOGASTRODUODENOSCOPY N/A 07/06/2014   Procedure: ESOPHAGOGASTRODUODENOSCOPY (EGD);  Surgeon: Clarene Essex, MD;  Location: [name];  Service: Endoscopy;  Laterality: N/A;  . ESOPHAGOGASTRODUODENOSCOPY N/A 09/05/2014   Procedure: ESOPHAGOGASTRODUODENOSCOPY (EGD);  Surgeon: Laurence Spates, MD;  Location: [name];  Service: Endoscopy;  Laterality: N/A;  APC on standby to control bleeding  . ESOPHAGOGASTRODUODENOSCOPY N/A 11/29/2014   Procedure: ESOPHAGOGASTRODUODENOSCOPY (EGD);  Surgeon: [name], MD;  Location: [facility] ENDOSCOPY;  Service: Endoscopy;  Laterality: N/A;  . ESOPHAGOGASTRODUODENOSCOPY N/A 09/28/2015   Procedure: ESOPHAGOGASTRODUODENOSCOPY (EGD);  Surgeon: Clarene Essex, MD;  Location[name] ENDOSCOPY;  Service: Endoscopy;  Laterality: N/A;  . ESOPHAGOGASTRODUODENOSCOPY (EGD) WITH PROPOFOL N/A 12/04/2017   Procedure: ESOPHAGOGASTRODUODENOSCOPY (EGD) WITH PROPOFOL;  Surgeon: [name], MD;  Location: [place];  Service: Endoscopy;  Laterality: N/A;  . ESOPHAGOGASTRODUODENOSCOPY ENDOSCOPY  08/19/2006   with laser treatment  . HOT HEMOSTASIS N/A 07/06/2014   Procedure: HOT HEMOSTASIS (ARGON PLASMA COAGULATION/BICAP);  Surgeon: Clarene Essex, MD;  Location: [name];  Service: Endoscopy;  Laterality: N/A;  . HOT HEMOSTASIS N/A 09/28/2015   Procedure: HOT HEMOSTASIS (ARGON PLASMA COAGULATION/BICAP);  Surgeon: Clarene Essex, MD;  Location: [facility] ENDOSCOPY;  Service: Endoscopy;  Laterality: N/A;  . HOT HEMOSTASIS N/A 12/04/2017   Procedure: HOT HEMOSTASIS (ARGON PLASMA COAGULATION/BICAP);  Surgeon: [name], MD;  Location: [place];  Service: Endoscopy;  Laterality: N/A;  . NASAL HEMORRHAGE CONTROL     "for bleeding"   . SAVORY DILATION  02/26/2011   Procedure: SAVORY DILATION;  Surgeon: [name], MD;  Location: [name];  Service: Endoscopy;  Laterality: N/A;  c-arm needed  . SUBMUCOSAL INJECTION  12/04/2017   Procedure: SUBMUCOSAL INJECTION;  Surgeon:  [name], MD;  Location: [facility] ENDOSCOPY;  Service: Endoscopy;;    I have reviewed the social history and family history with the patient and they are unchanged from previous note.  ALLERGIES:  is allergic to aspirin.  MEDICATIONS:  Current Outpatient Medications  Medication Sig Dispense Refill  . augmented betamethasone dipropionate (DIPROLENE-AF) 0.05 % cream Apply 1 application topically daily.  2  . b complex vitamins capsule Take 1 capsule by mouth daily. 100 capsule 2  . cetaphil (CETAPHIL) lotion Apply 1 application topically as needed for dry skin. 236 mL 0  . donepezil (ARICEPT) 10 MG tablet Take 1 tablet (10 mg total) by mouth at bedtime. 90 tablet 4  . Dulaglutide (TRULICITY) 1.5 QM/0.8QP SOPN Inject 1.5 mg into the skin once a week. 4 pen 3  . Elastic Bandages & Supports (MEDICAL COMPRESSION STOCKINGS) MISC Wear as much as possible while awake to reduce swelling 2 each 0  . FERROCITE 324 MG TABS tablet TAKE 1 TABLET BY MOUTH TWICE DAILY 180 tablet 3  . furosemide (LASIX) 20 MG tablet Take 1 tablet (20 mg total) by mouth daily. 90 tablet 3  . glucose blood (ONETOUCH VERIO) test strip Use as instructed 4 times daily. E11.29, insulin requiring 400 each 3  . insulin degludec (TRESIBA FLEXTOUCH) 100 UNIT/ML SOPN FlexTouch Pen Inject 0.2 mLs (20 Units total) into the skin every morning.  6 pen 3  . Insulin Pen Needle (B-D UF III MINI PEN NEEDLES) 31G X 5 MM MISC USE TO INJECT INSULIN 3 TIMES DAILY 100 each 1  . lactulose (CHRONULAC) 10 GM/15ML solution TAKE 15 ML BY MOUTH TWO TIMES DAILY 473 mL 2  . levETIRAcetam (KEPPRA) 500 MG tablet Take 1 tablet (500 mg total) by mouth 2 (two) times daily. 180 tablet 4  . memantine (NAMENDA) 10 MG tablet Take 1 tablet (10 mg total) by mouth 2 (two) times daily. 180 tablet 4  . ONETOUCH DELICA LANCETS FINE MISC Check blood sugar up to 4 times a day 300 each 4  . oxycodone-acetaminophen (PERCOCET) 2.5-325 MG tablet Take 1-2 tablets by mouth at  bedtime as needed for pain. 30 tablet 0  . oxymetazoline (AFRIN) 0.05 % nasal spray Place 1 spray into both nostrils 2 (two) times daily as needed (Epistaxis). 30 mL 0  . pantoprazole (PROTONIX) 40 MG tablet TAKE 1 TABLET BY MOUTH ONCE DAILY 180 tablet 0  . potassium chloride (K-DUR,KLOR-CON) 10 MEQ tablet TAKE 1 TABLET BY MOUTH ONCE DAILY 90 tablet 1  . sodium chloride (OCEAN) 0.65 % SOLN nasal spray Place 1 spray into both nostrils as needed for congestion. 1 Bottle 5  . traMADol (ULTRAM) 50 MG tablet Take 1 tablet (50 mg total) by mouth every 12 (twelve) hours as needed for moderate pain. 14 tablet 0  . triamcinolone ointment (KENALOG) 0.5 % APPLY 1 APPLICATION TOPICALLY TO RASH TWICE DAILY FOR ITCHING 15 g 1  . calcium citrate-vitamin D (CITRACAL+D) 315-200 MG-UNIT tablet Take 1 tablet by mouth 2 (two) times daily. 100 tablet 0   No current facility-administered medications for this visit.     PHYSICAL EXAMINATION: ECOG PERFORMANCE STATUS: 3 - Symptomatic, >50% confined to bed  Vitals:   05/09/18 1409  BP: (!) 130/48  Pulse: 79  Resp: 17  Temp: 99 F (37.2 C)  SpO2: 98%   Filed Weights   05/09/18 1409  Weight: 144 lb 14.4 oz (65.7 kg)    GENERAL:alert, no distress and comfortable SKIN: skin color, texture, turgor are normal, no rashes or significant lesions EYES: normal, Conjunctiva are pink and non-injected, sclera clear OROPHARYNX:no exudate, no erythema and lips, buccal mucosa, and tongue normal  NECK: supple, thyroid normal size, non-tender, without nodularity LYMPH:  no palpable lymphadenopathy in the cervical, axillary or inguinal LUNGS: clear to auscultation and percussion with normal breathing effort HEART: regular rate & rhythm and no murmurs and no lower extremity edema ABDOMEN:abdomen soft, non-tender and normal bowel sounds Musculoskeletal:no cyanosis of digits and no clubbing  NEURO: alert & oriented x 3 with fluent speech, no focal motor/sensory  deficits  LABORATORY DATA:  I have reviewed the data as listed CBC Latest Ref Rng & Units 05/09/2018 04/19/2018 04/18/2018  WBC 4.0 - 10.5 K/uL 6.6 7.1 6.3  Hemoglobin 12.0 - 15.0 g/dL 8.3(L) 9.8(L) 9.7(L)  Hematocrit 36.0 - 46.0 % 26.1(L) 31.2(L) 30.4(L)  Platelets 150 - 400 K/uL 197 149(L) 157     CMP Latest Ref Rng & Units 04/19/2018 03/08/2018 12/05/2017  Glucose 70 - 99 mg/dL 331(H) 367(H) 75  BUN 8 - 23 mg/dL 21 17 33(H)  Creatinine 0.44 - 1.00 mg/dL 1.66(H) 1.29(H) 1.51(H)  Sodium 135 - 145 mmol/L 142 140 142  Potassium 3.5 - 5.1 mmol/L 4.2 4.3 3.8  Chloride 98 - 111 mmol/L 104 103 110  CO2 22 - 32 mmol/L 27 22 29   Calcium 8.9 - 10.3 mg/dL 9.2 8.8  8.8(L)  Total Protein 6.5 - 8.1 g/dL 6.0(L) - -  Total Bilirubin 0.3 - 1.2 mg/dL 0.7 - -  Alkaline Phos 38 - 126 U/L 87 - -  AST 15 - 41 U/L 22 - -  ALT 0 - 44 U/L 18 - -      RADIOGRAPHIC STUDIES: I have personally reviewed the radiological images as listed and agreed with the findings in the report. No results found.   ASSESSMENT & PLAN:  [name] is a 78 y.o. female with   1. Hereditary hemorrhagic telangiectasia (HHT)), with epistaxis and frequent GI bleeding -She has mild epistaxis, and frequent GI bleeding, has required blood transfusion every 3 to 4 weeks. Her last blood transfusion was on 04/13/2018  -Does have black stool present. Labs reviewed today, CBC WNL except hg at 8.3, RBC 2.68, HCT 26.1. No blood transfusion today. Will proceed with IV Feraheme today.  -Will start her on Avastin every 2 weeks, which has been showed positive results in decreased HHT related bleeding. Will begin today.  -I encouraged her to contact our clinic if she develops increased bleeding anywhere of gums or nose or significant fatigue.  -F/u in 2 weeks   2. 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 Memory Loss  -She takes Aricept and memantine -f/u with neurology   5. 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 Dr. [name] most recently prescribed percocet, I do not plan to refill  -She takes tramadol also  -She continues to have stable leg pain that effects her walking. I discussed her pain is likely mainly caused by her uncontrolled DM.  -I encouraged her to follow up with her PCP soon and more frequently.   6. DM, CHF -She is on insulin  -She monitors her blood glucose at home  -DM not controlled  -CHF stable clinically    Plan  -Labs reviewed today, no blood transfusion today. Proceed with IV Feraheme and Avastin today  -Lab, flush and Avastin, ferritin every 2 weeks x4 -F/u in 2 and 6 weeks     No problem-specific Assessment & Plan notes found for this encounter.   No orders of the defined types were placed in this encounter.  All questions were answered. The patient knows to call the clinic with any problems, questions or concerns. No barriers to learning was detected. I spent 10 minutes counseling the patient face to face. The total time spent in the appointment was 15 minutes and more than 50% was on counseling and review of test results     Truitt Merle, MD 05/09/2018   I, Joslyn Devon, am acting as scribe for Truitt Merle, MD.   I have reviewed the above documentation for accuracy and completeness, and I agree with the above.

## 2018-05-05 NOTE — Progress Notes (Signed)
DM management follow up [name] is a 78 y.o. female who is contacted for follow up DM management  A/P Current Regimen  Degludec insulin 20 units daily  Dulaglutide (Trulicity) 1.5 mg weekly  Home BG Monitoring  Patient does have a meter at home and does check BG at home.  CBGs at home 100s and 200s. No BG in 300 or higher, no BG 70 or lower  S/Sx of hyper- or hypoglycemia: none, none  Medication Management No medication changes recommended at this time. Current home BG correlate with A1C around 8.5% which can be considered close to goal or at goal due to patient age and comorbid conditions.  Patient Education  Patient educated about signs/symptoms and advised to contact clinic if hyper- or hypoglycemic.  Emphasized dietary management for non-pharmacological management at this time.  Patient did  verbalize understanding of information and regimen by repeating back topics discussed.  Follow-up 2 weeks  [name] 10:50 AM 05/05/2018

## 2018-05-09 ENCOUNTER — Encounter: Payer: Self-pay | Admitting: Hematology

## 2018-05-09 ENCOUNTER — Inpatient Hospital Stay: Payer: Medicare Other

## 2018-05-09 ENCOUNTER — Inpatient Hospital Stay (HOSPITAL_BASED_OUTPATIENT_CLINIC_OR_DEPARTMENT_OTHER): Payer: Medicare Other | Admitting: Hematology

## 2018-05-09 ENCOUNTER — Other Ambulatory Visit: Payer: Self-pay

## 2018-05-09 VITALS — BP 106/46 | HR 63 | Temp 97.9°F | Resp 18

## 2018-05-09 VITALS — BP 130/48 | HR 79 | Temp 99.0°F | Resp 17 | Ht 63.0 in | Wt 144.9 lb

## 2018-05-09 DIAGNOSIS — E785 Hyperlipidemia, unspecified: Secondary | ICD-10-CM | POA: Diagnosis not present

## 2018-05-09 DIAGNOSIS — I780 Hereditary hemorrhagic telangiectasia: Secondary | ICD-10-CM

## 2018-05-09 DIAGNOSIS — R413 Other amnesia: Secondary | ICD-10-CM

## 2018-05-09 DIAGNOSIS — D500 Iron deficiency anemia secondary to blood loss (chronic): Secondary | ICD-10-CM

## 2018-05-09 DIAGNOSIS — I10 Essential (primary) hypertension: Secondary | ICD-10-CM | POA: Diagnosis not present

## 2018-05-09 DIAGNOSIS — Z794 Long term (current) use of insulin: Secondary | ICD-10-CM

## 2018-05-09 DIAGNOSIS — Z79899 Other long term (current) drug therapy: Secondary | ICD-10-CM

## 2018-05-09 DIAGNOSIS — I5032 Chronic diastolic (congestive) heart failure: Secondary | ICD-10-CM

## 2018-05-09 DIAGNOSIS — Z5112 Encounter for antineoplastic immunotherapy: Secondary | ICD-10-CM | POA: Diagnosis not present

## 2018-05-09 LAB — CBC WITH DIFFERENTIAL (CANCER CENTER ONLY)
Abs Immature Granulocytes: 0.02 10*3/uL (ref 0.00–0.07)
Basophils Absolute: 0 10*3/uL (ref 0.0–0.1)
Basophils Relative: 1 %
Eosinophils Absolute: 0.2 10*3/uL (ref 0.0–0.5)
Eosinophils Relative: 3 %
HEMATOCRIT: 26.1 % — AB (ref 36.0–46.0)
Hemoglobin: 8.3 g/dL — ABNORMAL LOW (ref 12.0–15.0)
Immature Granulocytes: 0 %
Lymphocytes Relative: 6 %
Lymphs Abs: 0.4 10*3/uL — ABNORMAL LOW (ref 0.7–4.0)
MCH: 31 pg (ref 26.0–34.0)
MCHC: 31.8 g/dL (ref 30.0–36.0)
MCV: 97.4 fL (ref 80.0–100.0)
MONOS PCT: 7 %
Monocytes Absolute: 0.5 10*3/uL (ref 0.1–1.0)
NEUTROS ABS: 5.4 10*3/uL (ref 1.7–7.7)
Neutrophils Relative %: 83 %
Platelet Count: 197 10*3/uL (ref 150–400)
RBC: 2.68 MIL/uL — ABNORMAL LOW (ref 3.87–5.11)
RDW: 15.7 % — ABNORMAL HIGH (ref 11.5–15.5)
WBC Count: 6.6 10*3/uL (ref 4.0–10.5)
nRBC: 0 % (ref 0.0–0.2)

## 2018-05-09 MED ORDER — SODIUM CHLORIDE 0.9 % IJ SOLN
10.0000 mL | INTRAMUSCULAR | Status: DC | PRN
  Filled 2018-05-09: qty 10

## 2018-05-09 MED ORDER — HEPARIN SOD (PORK) LOCK FLUSH 100 UNIT/ML IV SOLN
500.0000 [IU] | Freq: Once | INTRAVENOUS | Status: AC | PRN
  Administered 2018-05-09: 500 [IU]
  Filled 2018-05-09: qty 5

## 2018-05-09 MED ORDER — SODIUM CHLORIDE 0.9 % IV SOLN
300.0000 mg | Freq: Once | INTRAVENOUS | Status: AC
  Administered 2018-05-09: 300 mg via INTRAVENOUS
  Filled 2018-05-09: qty 12

## 2018-05-09 MED ORDER — SODIUM CHLORIDE 0.9 % IV SOLN
Freq: Once | INTRAVENOUS | Status: AC
  Administered 2018-05-09: 16:00:00 via INTRAVENOUS
  Filled 2018-05-09: qty 250

## 2018-05-09 MED ORDER — SODIUM CHLORIDE 0.9% FLUSH
10.0000 mL | INTRAVENOUS | Status: DC | PRN
  Administered 2018-05-09: 10 mL
  Filled 2018-05-09: qty 10

## 2018-05-09 MED ORDER — SODIUM CHLORIDE 0.9 % IV SOLN
510.0000 mg | Freq: Once | INTRAVENOUS | Status: AC
  Administered 2018-05-09: 510 mg via INTRAVENOUS
  Filled 2018-05-09: qty 17

## 2018-05-09 MED ORDER — HEPARIN SOD (PORK) LOCK FLUSH 100 UNIT/ML IV SOLN
500.0000 [IU] | Freq: Once | INTRAVENOUS | Status: DC | PRN
  Filled 2018-05-09: qty 5

## 2018-05-09 NOTE — Patient Instructions (Addendum)
[facility]  Today you received the following: Avastin   If you develop nausea and vomiting that is not controlled by your nausea medication, call the clinic.   BELOW ARE SYMPTOMS THAT SHOULD BE REPORTED IMMEDIATELY:  *FEVER GREATER THAN 100.5 F  *CHILLS WITH OR WITHOUT FEVER  NAUSEA AND VOMITING THAT IS NOT CONTROLLED WITH YOUR NAUSEA MEDICATION  *UNUSUAL SHORTNESS OF BREATH  *UNUSUAL BRUISING OR BLEEDING  TENDERNESS IN MOUTH AND THROAT WITH OR WITHOUT PRESENCE OF ULCERS  *URINARY PROBLEMS  *BOWEL PROBLEMS  UNUSUAL RASH Items with * indicate a potential emergency and should be followed up as soon as possible.  Feel free to call the clinic should you have any questions or concerns. The clinic phone number is [phone].  Please show the [place] at check-in to the Emergency Department and triage nurse.  Bevacizumab (Avastin) injection What is this medicine? BEVACIZUMAB (be va SIZ yoo mab) is a monoclonal antibody. It is used to treat many types of cancer. This medicine may be used for other purposes; ask your health care provider or pharmacist if you have questions. COMMON BRAND NAME(S): Avastin, MVASI What should I tell my health care provider before I take this medicine? They need to know if you have any of these conditions: -diabetes -heart disease -high blood pressure -history of coughing up blood -prior anthracycline chemotherapy (e.g., doxorubicin, daunorubicin, epirubicin) -recent or ongoing radiation therapy -recent or planning to have surgery -stroke -an unusual or allergic reaction to bevacizumab, hamster proteins, mouse proteins, other medicines, foods, dyes, or preservatives -pregnant or trying to get pregnant -breast-feeding How should I use this medicine? This medicine is for infusion into a vein. It is given by a health care professional in a hospital or clinic setting. Talk to your pediatrician regarding the use of this  medicine in children. Special care may be needed. Overdosage: If you think you have taken too much of this medicine contact a poison control center or emergency room at once. NOTE: This medicine is only for you. Do not share this medicine with others. What if I miss a dose? It is important not to miss your dose. Call your doctor or health care professional if you are unable to keep an appointment. What may interact with this medicine? Interactions are not expected. This list may not describe all possible interactions. Give your health care provider a list of all the medicines, herbs, non-prescription drugs, or dietary supplements you use. Also tell them if you smoke, drink alcohol, or use illegal drugs. Some items may interact with your medicine. What should I watch for while using this medicine? Your condition will be monitored carefully while you are receiving this medicine. You will need important blood work and urine testing done while you are taking this medicine. This medicine may increase your risk to bruise or bleed. Call your doctor or health care professional if you notice any unusual bleeding. This medicine should be started at least 28 days following major surgery and the site of the surgery should be totally healed. Check with your doctor before scheduling dental work or surgery while you are receiving this treatment. Talk to your doctor if you have recently had surgery or if you have a wound that has not healed. Do not become pregnant while taking this medicine or for 6 months after stopping it. Women should inform their doctor if they wish to become pregnant or think they might be pregnant. There is a potential for  serious side effects to an unborn child. Talk to your health care professional or pharmacist for more information. Do not breast-feed an infant while taking this medicine and for 6 months after the last dose. This medicine has caused ovarian failure in some women. This medicine  may interfere with the ability to have a child. You should talk to your doctor or health care professional if you are concerned about your fertility. What side effects may I notice from receiving this medicine? Side effects that you should report to your doctor or health care professional as soon as possible: -allergic reactions like skin rash, itching or hives, swelling of the face, lips, or tongue -chest pain or chest tightness -chills -coughing up blood -high fever -seizures -severe constipation -signs and symptoms of bleeding such as bloody or black, tarry stools; red or dark-brown urine; spitting up blood or brown material that looks like coffee grounds; red spots on the skin; unusual bruising or bleeding from the eye, gums, or nose -signs and symptoms of a blood clot such as breathing problems; chest pain; severe, sudden headache; pain, swelling, warmth in the leg -signs and symptoms of a stroke like changes in vision; confusion; trouble speaking or understanding; severe headaches; sudden numbness or weakness of the face, arm or leg; trouble walking; dizziness; loss of balance or coordination -stomach pain -sweating -swelling of legs or ankles -vomiting -weight gain Side effects that usually do not require medical attention (report to your doctor or health care professional if they continue or are bothersome): -back pain -changes in taste -decreased appetite -dry skin -nausea -tiredness This list may not describe all possible side effects. Call your doctor for medical advice about side effects. You may report side effects to FDA at 1-800-FDA-1088. Where should I keep my medicine? This drug is given in a hospital or clinic and will not be stored at home. NOTE: This sheet is a summary. It may not cover all possible information. If you have questions about this medicine, talk to your doctor, pharmacist, or health care provider.  2019 Elsevier/Gold Standard (2016-01-24  14:33:29)   Ferumoxytol injection What is this medicine? FERUMOXYTOL is an iron complex. Iron is used to make healthy red blood cells, which carry oxygen and nutrients throughout the body. This medicine is used to treat iron deficiency anemia. This medicine may be used for other purposes; ask your health care provider or pharmacist if you have questions. COMMON BRAND NAME(S): Feraheme What should I tell my health care provider before I take this medicine? They need to know if you have any of these conditions: -anemia not caused by low iron levels -high levels of iron in the blood -magnetic resonance imaging (MRI) test scheduled -an unusual or allergic reaction to iron, other medicines, foods, dyes, or preservatives -pregnant or trying to get pregnant -breast-feeding How should I use this medicine? This medicine is for injection into a vein. It is given by a health care professional in a hospital or clinic setting. Talk to your pediatrician regarding the use of this medicine in children. Special care may be needed. Overdosage: If you think you have taken too much of this medicine contact a poison control center or emergency room at once. NOTE: This medicine is only for you. Do not share this medicine with others. What if I miss a dose? It is important not to miss your dose. Call your doctor or health care professional if you are unable to keep an appointment. What may interact with this medicine?  This medicine may interact with the following medications: -other iron products This list may not describe all possible interactions. Give your health care provider a list of all the medicines, herbs, non-prescription drugs, or dietary supplements you use. Also tell them if you smoke, drink alcohol, or use illegal drugs. Some items may interact with your medicine. What should I watch for while using this medicine? Visit your doctor or healthcare professional regularly. Tell your doctor or  healthcare professional if your symptoms do not start to get better or if they get worse. You may need blood work done while you are taking this medicine. You may need to follow a special diet. Talk to your doctor. Foods that contain iron include: whole grains/cereals, dried fruits, beans, or peas, leafy green vegetables, and organ meats (liver, kidney). What side effects may I notice from receiving this medicine? Side effects that you should report to your doctor or health care professional as soon as possible: -allergic reactions like skin rash, itching or hives, swelling of the face, lips, or tongue -breathing problems -changes in blood pressure -feeling faint or lightheaded, falls -fever or chills -flushing, sweating, or hot feelings -swelling of the ankles or feet Side effects that usually do not require medical attention (report to your doctor or health care professional if they continue or are bothersome): -diarrhea -headache -nausea, vomiting -stomach pain This list may not describe all possible side effects. Call your doctor for medical advice about side effects. You may report side effects to FDA at 1-800-FDA-1088. Where should I keep my medicine? This drug is given in a hospital or clinic and will not be stored at home. NOTE: This sheet is a summary. It may not cover all possible information. If you have questions about this medicine, talk to your doctor, pharmacist, or health care provider.  2019 Elsevier/Gold Standard (2016-03-16 20:21:10)

## 2018-05-10 ENCOUNTER — Other Ambulatory Visit: Payer: Self-pay | Admitting: Hematology

## 2018-05-10 ENCOUNTER — Telehealth: Payer: Self-pay | Admitting: Hematology

## 2018-05-10 DIAGNOSIS — D500 Iron deficiency anemia secondary to blood loss (chronic): Secondary | ICD-10-CM

## 2018-05-10 NOTE — Telephone Encounter (Signed)
Called and scheduled appt per 3/30 los.  Patient aware of appt date and time.

## 2018-05-11 ENCOUNTER — Telehealth: Payer: Self-pay | Admitting: Hematology

## 2018-05-11 ENCOUNTER — Encounter: Payer: Self-pay | Admitting: Hematology

## 2018-05-11 ENCOUNTER — Other Ambulatory Visit: Payer: Self-pay

## 2018-05-11 ENCOUNTER — Inpatient Hospital Stay: Payer: Medicare Other

## 2018-05-11 ENCOUNTER — Inpatient Hospital Stay: Payer: Medicare Other | Attending: Hematology | Admitting: Hematology

## 2018-05-11 VITALS — BP 153/48 | HR 70 | Temp 98.5°F | Resp 18 | Ht 63.0 in | Wt 147.9 lb

## 2018-05-11 DIAGNOSIS — I780 Hereditary hemorrhagic telangiectasia: Secondary | ICD-10-CM | POA: Insufficient documentation

## 2018-05-11 DIAGNOSIS — E1165 Type 2 diabetes mellitus with hyperglycemia: Secondary | ICD-10-CM | POA: Insufficient documentation

## 2018-05-11 DIAGNOSIS — M79604 Pain in right leg: Secondary | ICD-10-CM | POA: Insufficient documentation

## 2018-05-11 DIAGNOSIS — D500 Iron deficiency anemia secondary to blood loss (chronic): Secondary | ICD-10-CM | POA: Diagnosis not present

## 2018-05-11 DIAGNOSIS — K922 Gastrointestinal hemorrhage, unspecified: Secondary | ICD-10-CM | POA: Insufficient documentation

## 2018-05-11 DIAGNOSIS — Z794 Long term (current) use of insulin: Secondary | ICD-10-CM | POA: Insufficient documentation

## 2018-05-11 DIAGNOSIS — I5032 Chronic diastolic (congestive) heart failure: Secondary | ICD-10-CM | POA: Insufficient documentation

## 2018-05-11 DIAGNOSIS — I110 Hypertensive heart disease with heart failure: Secondary | ICD-10-CM | POA: Diagnosis not present

## 2018-05-11 DIAGNOSIS — M79605 Pain in left leg: Secondary | ICD-10-CM | POA: Insufficient documentation

## 2018-05-11 DIAGNOSIS — R630 Anorexia: Secondary | ICD-10-CM | POA: Insufficient documentation

## 2018-05-11 DIAGNOSIS — R413 Other amnesia: Secondary | ICD-10-CM | POA: Insufficient documentation

## 2018-05-11 DIAGNOSIS — Z79899 Other long term (current) drug therapy: Secondary | ICD-10-CM | POA: Insufficient documentation

## 2018-05-11 DIAGNOSIS — Z5112 Encounter for antineoplastic immunotherapy: Secondary | ICD-10-CM | POA: Insufficient documentation

## 2018-05-11 DIAGNOSIS — R5383 Other fatigue: Secondary | ICD-10-CM | POA: Diagnosis not present

## 2018-05-11 DIAGNOSIS — M542 Cervicalgia: Secondary | ICD-10-CM | POA: Insufficient documentation

## 2018-05-11 DIAGNOSIS — K219 Gastro-esophageal reflux disease without esophagitis: Secondary | ICD-10-CM | POA: Diagnosis not present

## 2018-05-11 DIAGNOSIS — R040 Epistaxis: Secondary | ICD-10-CM | POA: Diagnosis not present

## 2018-05-11 DIAGNOSIS — Q2733 Arteriovenous malformation of digestive system vessel: Secondary | ICD-10-CM | POA: Diagnosis not present

## 2018-05-11 DIAGNOSIS — E785 Hyperlipidemia, unspecified: Secondary | ICD-10-CM | POA: Diagnosis not present

## 2018-05-11 LAB — CBC WITH DIFFERENTIAL (CANCER CENTER ONLY)
Abs Immature Granulocytes: 0.05 10*3/uL (ref 0.00–0.07)
Basophils Absolute: 0 10*3/uL (ref 0.0–0.1)
Basophils Relative: 1 %
Eosinophils Absolute: 0.3 10*3/uL (ref 0.0–0.5)
Eosinophils Relative: 5 %
HCT: 26 % — ABNORMAL LOW (ref 36.0–46.0)
Hemoglobin: 8.1 g/dL — ABNORMAL LOW (ref 12.0–15.0)
Immature Granulocytes: 1 %
Lymphocytes Relative: 8 %
Lymphs Abs: 0.5 10*3/uL — ABNORMAL LOW (ref 0.7–4.0)
MCH: 31.5 pg (ref 26.0–34.0)
MCHC: 31.2 g/dL (ref 30.0–36.0)
MCV: 101.2 fL — ABNORMAL HIGH (ref 80.0–100.0)
Monocytes Absolute: 0.5 10*3/uL (ref 0.1–1.0)
Monocytes Relative: 9 %
Neutro Abs: 4.9 10*3/uL (ref 1.7–7.7)
Neutrophils Relative %: 76 %
Platelet Count: 197 10*3/uL (ref 150–400)
RBC: 2.57 MIL/uL — ABNORMAL LOW (ref 3.87–5.11)
RDW: 15.8 % — ABNORMAL HIGH (ref 11.5–15.5)
WBC Count: 6.3 10*3/uL (ref 4.0–10.5)
nRBC: 0 % (ref 0.0–0.2)

## 2018-05-11 LAB — SAMPLE TO BLOOD BANK

## 2018-05-11 NOTE — Telephone Encounter (Signed)
No los per 4/1 °

## 2018-05-11 NOTE — Progress Notes (Signed)
[place]   [phone] [phone]   Clinic Follow up Note   Patient Care Team: Delice Bison, DO as PCP - General [name], MD as PCP - Cardiology (Cardiology)  Date of Service:  05/11/2018  CHIEF COMPLAINT:  Not feeling well after recent infusion    CURRENT THERAPY:  HEMATOLOGY HISTORY 1.Hereditary hemorrhagic telangiectasia (HHT),with epistaxis andfrequentGI bleeding. Previously under care of Dr. [name] 2.Anemia secondary to GI bleeding and iron deficiency, required blood transfusion every 3 to 4 weeks, IV Feraheme every 1 to 2 months   CURRENT THERAPY 1. Iv feraheme as needed if ferritin<50 2.Avastin5mg /kg every 2 weeks, starting 05/09/18  3. Blood transfusion if Hg<8.0 (<7 due to COVID-19)  INTERVAL HISTORY:  [name] is here for a follow up of after first dose Avastin 2 days ago. She called last night and appointment was made for her to be seen today. She presents to the clinic today by herself. She notes last night she did not feel well and still feels the same. She notes having fatigue and having 3 BM last night. This is stable for her. She denied blood in stool. She notes her appetite is fair and is overall eating smaller meals. She notes she drinks plenty of water. She denies fever or nausea. Her b/l leg pain is stable.   REVIEW OF SYSTEMS:   Constitutional: Denies fevers, chills or abnormal weight loss (+) Significant fatigue (+) Low appetite  Eyes: Denies blurriness of vision Ears, nose, mouth, throat, and face: Denies mucositis or sore throat Respiratory: Denies cough, dyspnea or wheezes Cardiovascular: Denies palpitation, chest discomfort or lower extremity swelling Gastrointestinal:  Denies nausea, heartburn or change in bowel habits Skin: Denies abnormal skin rashes MSK: (+) b/l left pain and cramping, stable  Lymphatics: Denies new lymphadenopathy or easy bruising Neurological:Denies  numbness, tingling or new weaknesses Behavioral/Psych: Mood is stable, no new changes  All other systems were reviewed with the patient and are negative.  MEDICAL HISTORY:  Past Medical History:  Diagnosis Date  . Chronic anemia   . Chronic diastolic CHF (congestive heart failure) ([place]) 10/03/2013  . Chronic GI bleeding    [name] 11/29/2014  . Family history of anesthesia complication    "niece has a hard time coming out" (09/15/2012)  . Frequent nosebleeds    chronic  . Gastric AV malformation    [name] 11/29/2014  . GERD (gastroesophageal reflux disease)   . 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 History of blood transfusion "several"  . History of epistaxis   . HTN (hypertension), benign 03/02/2012  . Hyperlipidemia   . Iron deficiency anemia    chronic infusions"  . Lichen planus    Both lower extremities  . 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 Overgrown toenails 03/18/2017  . Pneumonia 1990's X 2  . Pulmonary HTN ([place]) 04/01/2017   PASP 13mmHg on echo 09/2016  . Seizures ([place]) 09/2014  . Symptomatic anemia 11/29/2014  . Telangiectasia    Gastric   . 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    SURGICAL HISTORY: Past Surgical History:  Procedure Laterality Date  . CATARACT EXTRACTION     "I think it was just one eye"  . ESOPHAGOGASTRODUODENOSCOPY  02/26/2011   Procedure: ESOPHAGOGASTRODUODENOSCOPY (EGD);  Surgeon: [name], MD;  Location: [name];  Service: Endoscopy;  Laterality: N/A;  . ESOPHAGOGASTRODUODENOSCOPY N/A 11/08/2012   Procedure:  ESOPHAGOGASTRODUODENOSCOPY (EGD);  Surgeon: [name], MD;  Location: [name];  Service: Endoscopy;  Laterality: N/A;  . ESOPHAGOGASTRODUODENOSCOPY N/A 10/04/2013   Procedure: ESOPHAGOGASTRODUODENOSCOPY (EGD);  Surgeon: Winfield Cunas., MD;  Location: [name];  Service: Endoscopy;  Laterality: N/A;  with APC on stand-by  . ESOPHAGOGASTRODUODENOSCOPY N/A  07/06/2014   Procedure: ESOPHAGOGASTRODUODENOSCOPY (EGD);  Surgeon: Clarene Essex, MD;  Location: [name];  Service: Endoscopy;  Laterality: N/A;  . ESOPHAGOGASTRODUODENOSCOPY N/A 09/05/2014   Procedure: ESOPHAGOGASTRODUODENOSCOPY (EGD);  Surgeon: Laurence Spates, MD;  Location: [name];  Service: Endoscopy;  Laterality: N/A;  APC on standby to control bleeding  . ESOPHAGOGASTRODUODENOSCOPY N/A 11/29/2014   Procedure: ESOPHAGOGASTRODUODENOSCOPY (EGD);  Surgeon: [name], MD;  Location[facility] ENDOSCOPY;  Service: Endoscopy;  Laterality: N/A;  . ESOPHAGOGASTRODUODENOSCOPY N/A 09/28/2015   Procedure: ESOPHAGOGASTRODUODENOSCOPY (EGD);  Surgeon: Clarene Essex, MD;  Location: [facility] ENDOSCOPY;  Service: Endoscopy;  Laterality: N/A;  . ESOPHAGOGASTRODUODENOSCOPY (EGD) WITH PROPOFOL N/A 12/04/2017   Procedure: ESOPHAGOGASTRODUODENOSCOPY (EGD) WITH PROPOFOL;  Surgeon: [name], MD;  Location: [place];  Service: Endoscopy;  Laterality: N/A;  . ESOPHAGOGASTRODUODENOSCOPY ENDOSCOPY  08/19/2006   with laser treatment  . HOT HEMOSTASIS N/A 07/06/2014   Procedure: HOT HEMOSTASIS (ARGON PLASMA COAGULATION/BICAP);  Surgeon: Clarene Essex, MD;  Location: [name];  Service: Endoscopy;  Laterality: N/A;  . HOT HEMOSTASIS N/A 09/28/2015   Procedure: HOT HEMOSTASIS (ARGON PLASMA COAGULATION/BICAP);  Surgeon: Clarene Essex, MD;  Location: [facility] ENDOSCOPY;  Service: Endoscopy;  Laterality: N/A;  . HOT HEMOSTASIS N/A 12/04/2017   Procedure: HOT HEMOSTASIS (ARGON PLASMA COAGULATION/BICAP);  Surgeon: [name], MD;  Location: [place];  Service: Endoscopy;  Laterality: N/A;  . NASAL HEMORRHAGE CONTROL     "for bleeding"   . SAVORY DILATION  02/26/2011   Procedure: SAVORY DILATION;  Surgeon: [name], MD;  Location: [name];  Service: Endoscopy;  Laterality: N/A;  c-arm needed  . SUBMUCOSAL INJECTION  12/04/2017   Procedure: SUBMUCOSAL INJECTION;  Surgeon: [name], MD;  Location: [facility]  ENDOSCOPY;  Service: Endoscopy;;    I have reviewed the social history and family history with the patient and they are unchanged from previous note.  ALLERGIES:  is allergic to aspirin.  MEDICATIONS:  Current Outpatient Medications  Medication Sig Dispense Refill  . augmented betamethasone dipropionate (DIPROLENE-AF) 0.05 % cream Apply 1 application topically daily.  2  . b complex vitamins capsule Take 1 capsule by mouth daily. 100 capsule 2  . cetaphil (CETAPHIL) lotion Apply 1 application topically as needed for dry skin. 236 mL 0  . donepezil (ARICEPT) 10 MG tablet Take 1 tablet (10 mg total) by mouth at bedtime. 90 tablet 4  . Dulaglutide (TRULICITY) 1.5 ZM/6.2HU SOPN Inject 1.5 mg into the skin once a week. 4 pen 3  . Elastic Bandages & Supports (MEDICAL COMPRESSION STOCKINGS) MISC Wear as much as possible while awake to reduce swelling 2 each 0  . FERROCITE 324 MG TABS tablet TAKE 1 TABLET BY MOUTH TWICE DAILY 180 tablet 3  . furosemide (LASIX) 20 MG tablet Take 1 tablet (20 mg total) by mouth daily. 90 tablet 3  . glucose blood (ONETOUCH VERIO) test strip Use as instructed 4 times daily. E11.29, insulin requiring 400 each 3  . insulin degludec (TRESIBA FLEXTOUCH) 100 UNIT/ML SOPN FlexTouch Pen Inject 0.2 mLs (20 Units total) into the skin every morning. 6 pen 3  . Insulin Pen Needle (B-D UF III MINI PEN NEEDLES) 31G X 5 MM MISC USE  TO INJECT INSULIN 3 TIMES DAILY 100 each 1  . lactulose (CHRONULAC) 10 GM/15ML solution TAKE 15 ML BY MOUTH TWO TIMES DAILY 473 mL 2  . levETIRAcetam (KEPPRA) 500 MG tablet Take 1 tablet (500 mg total) by mouth 2 (two) times daily. 180 tablet 4  . memantine (NAMENDA) 10 MG tablet Take 1 tablet (10 mg total) by mouth 2 (two) times daily. 180 tablet 4  . ONETOUCH DELICA LANCETS FINE MISC Check blood sugar up to 4 times a day 300 each 4  . oxycodone-acetaminophen (PERCOCET) 2.5-325 MG tablet Take 1-2 tablets by mouth at bedtime as needed for pain. 30 tablet  0  . oxymetazoline (AFRIN) 0.05 % nasal spray Place 1 spray into both nostrils 2 (two) times daily as needed (Epistaxis). 30 mL 0  . pantoprazole (PROTONIX) 40 MG tablet TAKE 1 TABLET BY MOUTH ONCE DAILY 180 tablet 0  . potassium chloride (K-DUR,KLOR-CON) 10 MEQ tablet TAKE 1 TABLET BY MOUTH ONCE DAILY 90 tablet 1  . sodium chloride (OCEAN) 0.65 % SOLN nasal spray Place 1 spray into both nostrils as needed for congestion. 1 Bottle 5  . traMADol (ULTRAM) 50 MG tablet Take 1 tablet (50 mg total) by mouth every 12 (twelve) hours as needed for moderate pain. 14 tablet 0  . triamcinolone ointment (KENALOG) 0.5 % APPLY 1 APPLICATION TOPICALLY TO RASH TWICE DAILY FOR ITCHING 15 g 1  . calcium citrate-vitamin D (CITRACAL+D) 315-200 MG-UNIT tablet Take 1 tablet by mouth 2 (two) times daily. 100 tablet 0   No current facility-administered medications for this visit.     PHYSICAL EXAMINATION: ECOG PERFORMANCE STATUS: 3 - Symptomatic, >50% confined to bed  Vitals:   05/11/18 0932  BP: (!) 153/48  Pulse: 70  Resp: 18  Temp: 98.5 F (36.9 C)  SpO2: 100%   Filed Weights   05/11/18 0932  Weight: 147 lb 14.4 oz (67.1 kg)    GENERAL:alert, no distress and comfortable SKIN: skin color, texture, turgor are normal, no rashes or significant lesions EYES: normal, Conjunctiva are pink and non-injected, sclera clear OROPHARYNX:no exudate, no erythema and lips, buccal mucosa, and tongue normal  NECK: supple, thyroid normal size, non-tender, without nodularity LYMPH:  no palpable lymphadenopathy in the cervical, axillary or inguinal LUNGS: clear to auscultation and percussion with normal breathing effort HEART: regular rate & rhythm and no murmurs and no lower extremity edema ABDOMEN:abdomen soft, non-tender and normal bowel sounds Musculoskeletal:no cyanosis of digits and no clubbing  NEURO: alert & oriented x 3 with fluent speech, no focal motor/sensory deficits  LABORATORY DATA:  I have reviewed  the data as listed CBC Latest Ref Rng & Units 05/11/2018 05/09/2018 04/19/2018  WBC 4.0 - 10.5 K/uL 6.3 6.6 7.1  Hemoglobin 12.0 - 15.0 g/dL 8.1(L) 8.3(L) 9.8(L)  Hematocrit 36.0 - 46.0 % 26.0(L) 26.1(L) 31.2(L)  Platelets 150 - 400 K/uL 197 197 149(L)     CMP Latest Ref Rng & Units 04/19/2018 03/08/2018 12/05/2017  Glucose 70 - 99 mg/dL 331(H) 367(H) 75  BUN 8 - 23 mg/dL 21 17 33(H)  Creatinine 0.44 - 1.00 mg/dL 1.66(H) 1.29(H) 1.51(H)  Sodium 135 - 145 mmol/L 142 140 142  Potassium 3.5 - 5.1 mmol/L 4.2 4.3 3.8  Chloride 98 - 111 mmol/L 104 103 110  CO2 22 - 32 mmol/L 27 22 29   Calcium 8.9 - 10.3 mg/dL 9.2 8.8 8.8(L)  Total Protein 6.5 - 8.1 g/dL 6.0(L) - -  Total Bilirubin 0.3 - 1.2 mg/dL 0.7 - -  Alkaline Phos 38 - 126 U/L 87 - -  AST 15 - 41 U/L 22 - -  ALT 0 - 44 U/L 18 - -      RADIOGRAPHIC STUDIES: I have personally reviewed the radiological images as listed and agreed with the findings in the report. No results found.   ASSESSMENT & PLAN:  [name] is a 78 y.o. female with   1. Fatigue and anorexia  -she developed significant fatigue and anorexia one day after Avastin infusion.   -Vitas signs are stable, she is afebrile, no respiratory symptoms, or change of bowel movement -Lab reviewed, globin 8.1, overall stable no need blood transfusion today -Her blood glucose was over 300 yesterday, I strongly encouraged her to drink more water, monitor her diabetes, and follow-up with his PCP -he symptoms are possible related to Avastin infusion, or hyperglycemia, will continue monitoring.   2. Hereditary hemorrhagic telangiectasia(HHT)), withepistaxis and frequent GI bleeding -She has mild epistaxis, and frequent GI bleeding, has required blood transfusion every 3 to 4 weeks. Her last blood transfusion was on 04/13/2018 -She started Avastin for q2weeks on 05/09/18.  -Last night she started to not feel well with significant fatigue and low appetite. She denies change in her  pain or bowel movements. She has no nausea, fever or blood in stool.  -Labs today shows hemoglobin 8.1, overall stable.    3. 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 [name] most recently prescribed percocet, I do not plan to refill -She takes tramadolalso -She continues to have stable leg pain that effects her walking. I discussed her pain is likely mainly caused by her uncontrolled DM.  -I encouraged her to follow up with her PCP soon and more frequently.   7. DM,CHF -She is on insulin  -She monitors her blood glucose at home -DM not well controlled  -CHF stable clinically   Plan -lab reviewed with pt, will continue monitoring  -She will return in 2 weeks for lab, follow-up in infusion    No problem-specific Assessment & Plan notes found for this encounter.   No orders of the defined types were placed in this encounter.  All questions were answered. The patient knows to call the clinic with any problems, questions or concerns. No barriers to learning was detected. I spent 15 minutes counseling the patient face to face. The total time spent in the appointment was 20 minutes and more than 50% was on counseling and review of test results     Truitt Merle, MD 05/11/2018   I, Joslyn Devon, am acting as scribe for Truitt Merle, MD.   I have reviewed the above  documentation for accuracy and completeness, and I agree with the above.

## 2018-05-16 ENCOUNTER — Telehealth: Payer: Self-pay | Admitting: Pharmacist

## 2018-05-16 NOTE — Progress Notes (Addendum)
Diabetes Medication Management Follow Up  Blood sugar: home average 212 (correlates with A1C of around 9%) Diabetes Medicine: insulin degludec Tyler Aas) 20 units daily, dulaglutide Tyler Aas) 1.5 mg weekly Hypoglycemia symptoms: none, no home BG < 70 this past week Hyperglycemia symptoms: none---1 home BG > 300 (383) this past week  Patient still reports sometimes checking BG soon after eating a meal, so unsure of her actual BG average. Reinforced importance of waiting a few hours after a meal or checking BG prior to eating. Also spoke to patient's husband and reviewed home BG monitoring. We also discussed the importance of healthy eating and advised patient/family to follow up with clinic dietician. I am hesitant to increase insulin dose at this time due to patient experiencing hypoglycemia with 22 units of degludec insulin. Will follow up in 2 weeks.  Patient and husband verbalized understanding.  Flossie Dibble, PharmD 05/16/2018 12:06 PM.

## 2018-05-20 NOTE — Progress Notes (Signed)
[place]   [phone] [phone]   Clinic Follow up Note   Patient Care Team: Delice Bison, DO as PCP - General [name], MD as PCP - Cardiology (Cardiology)  Date of Service:  05/23/2018  CHIEF COMPLAINT: F/u of Anemia due to chronic blood loss, HHT  HEMATOLOGY HISTORY 1.  Hereditary hemorrhagic telangiectasia (HHT), with epistaxis and frequent GI bleeding. Previously under care of Dr. [name] 2.  Anemia secondary to GI bleeding and iron deficiency, required blood transfusion every 3 to 4 weeks, IV Feraheme every 1 to 2 months  CURRENT THERAPY  1. Iv feraheme as needed if ferritin<50 2. Pending Avastin 5mg /kg every 2 weeks, starting 05/09/18 3. Blood transfusion if Hg<8.0, (now <7 due to COVID-19 restrictions.)  INTERVAL HISTORY:  [name] is here for a follow up of Anemia due to chronic blood loss, HHT. She presents to the clinic today by herself. She notes she feels well and better since last week. After her Avastin she felt sick but this has resolved.     REVIEW OF SYSTEMS:   Constitutional: Denies fevers, chills or abnormal weight loss Eyes: Denies blurriness of vision Ears, nose, mouth, throat, and face: Denies mucositis or sore throat Respiratory: Denies cough, dyspnea or wheezes Cardiovascular: Denies palpitation, chest discomfort or lower extremity swelling Gastrointestinal:  Denies nausea, heartburn or change in bowel habits Skin: Denies abnormal skin rashes Lymphatics: Denies new lymphadenopathy or easy bruising Neurological:Denies numbness, tingling or new weaknesses Behavioral/Psych: Mood is stable, no new changes  All other systems were reviewed with the patient and are negative.  MEDICAL HISTORY:  Past Medical History:  Diagnosis Date  . Chronic anemia   . Chronic diastolic CHF (congestive heart failure) ([place]) 10/03/2013  . Chronic GI bleeding    [name] 11/29/2014  . Family history of  anesthesia complication    "niece has a hard time coming out" (09/15/2012)  . Frequent nosebleeds    chronic  . Gastric AV malformation    [name] 11/29/2014  . GERD (gastroesophageal reflux disease)   . 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 History of blood transfusion "several"  . History of epistaxis   . HTN (hypertension), benign 03/02/2012  . Hyperlipidemia   . Iron deficiency anemia    chronic infusions"  . Lichen planus    Both lower extremities  . 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 Overgrown toenails 03/18/2017  . Pneumonia 1990's X 2  . Pulmonary HTN ([place]) 04/01/2017   PASP 97mmHg on echo 09/2016  . Seizures ([place]) 09/2014  . Symptomatic anemia 11/29/2014  . Telangiectasia    Gastric   . 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    SURGICAL HISTORY: Past Surgical History:  Procedure Laterality Date  . CATARACT EXTRACTION     "I think it was just one eye"  . ESOPHAGOGASTRODUODENOSCOPY  02/26/2011   Procedure: ESOPHAGOGASTRODUODENOSCOPY (EGD);  Surgeon: [name], MD;  Location: [name];  Service: Endoscopy;  Laterality: N/A;  . ESOPHAGOGASTRODUODENOSCOPY N/A 11/08/2012   Procedure: ESOPHAGOGASTRODUODENOSCOPY (EGD);  Surgeon: [name], MD;  Location: [name];  Service: Endoscopy;  Laterality: N/A;  . ESOPHAGOGASTRODUODENOSCOPY N/A 10/04/2013   Procedure: ESOPHAGOGASTRODUODENOSCOPY (EGD);  Surgeon: Winfield Cunas., MD;  Location: [name];  Service: Endoscopy;  Laterality: N/A;  with APC on stand-by  . ESOPHAGOGASTRODUODENOSCOPY N/A 07/06/2014   Procedure: ESOPHAGOGASTRODUODENOSCOPY (EGD);  Surgeon: Clarene Essex,  MD;  Location: [name];  Service: Endoscopy;  Laterality: N/A;  . ESOPHAGOGASTRODUODENOSCOPY N/A 09/05/2014   Procedure: ESOPHAGOGASTRODUODENOSCOPY (EGD);  Surgeon: Laurence Spates, MD;  Location: [name];  Service: Endoscopy;  Laterality: N/A;  APC on standby to control bleeding  .  ESOPHAGOGASTRODUODENOSCOPY N/A 11/29/2014   Procedure: ESOPHAGOGASTRODUODENOSCOPY (EGD);  Surgeon: [name], MD;  Location: [facility] ENDOSCOPY;  Service: Endoscopy;  Laterality: N/A;  . ESOPHAGOGASTRODUODENOSCOPY N/A 09/28/2015   Procedure: ESOPHAGOGASTRODUODENOSCOPY (EGD);  Surgeon: Clarene Essex, MD;  Location: [facility] ENDOSCOPY;  Service: Endoscopy;  Laterality: N/A;  . ESOPHAGOGASTRODUODENOSCOPY (EGD) WITH PROPOFOL N/A 12/04/2017   Procedure: ESOPHAGOGASTRODUODENOSCOPY (EGD) WITH PROPOFOL;  Surgeon: [name], MD;  Location: [place];  Service: Endoscopy;  Laterality: N/A;  . ESOPHAGOGASTRODUODENOSCOPY ENDOSCOPY  08/19/2006   with laser treatment  . HOT HEMOSTASIS N/A 07/06/2014   Procedure: HOT HEMOSTASIS (ARGON PLASMA COAGULATION/BICAP);  Surgeon: Clarene Essex, MD;  Location: [name];  Service: Endoscopy;  Laterality: N/A;  . HOT HEMOSTASIS N/A 09/28/2015   Procedure: HOT HEMOSTASIS (ARGON PLASMA COAGULATION/BICAP);  Surgeon: Clarene Essex, MD;  Location: [facility] ENDOSCOPY;  Service: Endoscopy;  Laterality: N/A;  . HOT HEMOSTASIS N/A 12/04/2017   Procedure: HOT HEMOSTASIS (ARGON PLASMA COAGULATION/BICAP);  Surgeon: [name], MD;  Location: [place];  Service: Endoscopy;  Laterality: N/A;  . NASAL HEMORRHAGE CONTROL     "for bleeding"   . SAVORY DILATION  02/26/2011   Procedure: SAVORY DILATION;  Surgeon: [name], MD;  Location: [name];  Service: Endoscopy;  Laterality: N/A;  c-arm needed  . SUBMUCOSAL INJECTION  12/04/2017   Procedure: SUBMUCOSAL INJECTION;  Surgeon: [name], MD;  Location: [facility] ENDOSCOPY;  Service: Endoscopy;;    I have reviewed the social history and family history with the patient and they are unchanged from previous note.  ALLERGIES:  is allergic to aspirin.  MEDICATIONS:  Current Outpatient Medications  Medication Sig Dispense Refill  . augmented betamethasone dipropionate (DIPROLENE-AF) 0.05 % cream Apply 1 application topically  daily.  2  . b complex vitamins capsule Take 1 capsule by mouth daily. 100 capsule 2  . cetaphil (CETAPHIL) lotion Apply 1 application topically as needed for dry skin. 236 mL 0  . donepezil (ARICEPT) 10 MG tablet Take 1 tablet (10 mg total) by mouth at bedtime. 90 tablet 4  . Dulaglutide (TRULICITY) 1.5 PZ/0.2HE SOPN Inject 1.5 mg into the skin once a week. 4 pen 3  . Elastic Bandages & Supports (MEDICAL COMPRESSION STOCKINGS) MISC Wear as much as possible while awake to reduce swelling 2 each 0  . FERROCITE 324 MG TABS tablet TAKE 1 TABLET BY MOUTH TWICE DAILY 180 tablet 3  . furosemide (LASIX) 20 MG tablet Take 1 tablet (20 mg total) by mouth daily. 90 tablet 3  . glucose blood (ONETOUCH VERIO) test strip Use as instructed 4 times daily. E11.29, insulin requiring 400 each 3  . insulin degludec (TRESIBA FLEXTOUCH) 100 UNIT/ML SOPN FlexTouch Pen Inject 0.2 mLs (20 Units total) into the skin every morning. 6 pen 3  . Insulin Pen Needle (B-D UF III MINI PEN NEEDLES) 31G X 5 MM MISC USE TO INJECT INSULIN 3 TIMES DAILY 100 each 1  . lactulose (CHRONULAC) 10 GM/15ML solution TAKE 15 ML BY MOUTH TWO TIMES DAILY 473 mL 2  . levETIRAcetam (KEPPRA) 500 MG tablet Take 1 tablet (500 mg total) by mouth 2 (two) times daily. 180 tablet 4  . memantine (NAMENDA) 10 MG tablet Take 1 tablet (10 mg total) by mouth  2 (two) times daily. 180 tablet 4  . ONETOUCH DELICA LANCETS FINE MISC Check blood sugar up to 4 times a day 300 each 4  . oxycodone-acetaminophen (PERCOCET) 2.5-325 MG tablet Take 1-2 tablets by mouth at bedtime as needed for pain. 30 tablet 0  . oxymetazoline (AFRIN) 0.05 % nasal spray Place 1 spray into both nostrils 2 (two) times daily as needed (Epistaxis). 30 mL 0  . pantoprazole (PROTONIX) 40 MG tablet TAKE 1 TABLET BY MOUTH ONCE DAILY 180 tablet 0  . potassium chloride (K-DUR,KLOR-CON) 10 MEQ tablet TAKE 1 TABLET BY MOUTH ONCE DAILY 90 tablet 1  . sodium chloride (OCEAN) 0.65 % SOLN nasal spray  Place 1 spray into both nostrils as needed for congestion. 1 Bottle 5  . traMADol (ULTRAM) 50 MG tablet Take 1 tablet (50 mg total) by mouth every 12 (twelve) hours as needed for moderate pain. 14 tablet 0  . triamcinolone ointment (KENALOG) 0.5 % APPLY 1 APPLICATION TOPICALLY TO RASH TWICE DAILY FOR ITCHING 15 g 1  . calcium citrate-vitamin D (CITRACAL+D) 315-200 MG-UNIT tablet Take 1 tablet by mouth 2 (two) times daily. 100 tablet 0   No current facility-administered medications for this visit.    Facility-Administered Medications Ordered in Other Visits  Medication Dose Route Frequency Provider Last Rate Last Dose  . bevacizumab (AVASTIN) 300 mg in sodium chloride 0.9 % 100 mL chemo infusion  300 mg Intravenous Once Truitt Merle, MD      . ferumoxytol [facility]) 510 mg in sodium chloride 0.9 % 100 mL IVPB  510 mg Intravenous Once Truitt Merle, MD 468 mL/hr at 05/23/18 1144 510 mg at 05/23/18 1144  . heparin lock flush 100 unit/mL  500 Units Intracatheter Once PRN Truitt Merle, MD      . sodium chloride flush (NS) 0.9 % injection 10 mL  10 mL Intracatheter PRN Truitt Merle, MD        PHYSICAL EXAMINATION: ECOG PERFORMANCE STATUS: 3 - Symptomatic, >50% confined to bed  Vitals:   05/23/18 1044  BP: (!) 149/49  Pulse: 61  Resp: 20  Temp: 99.6 F (37.6 C)  SpO2: 99%   Filed Weights   05/23/18 1044  Weight: 146 lb 8 oz (66.5 kg)    GENERAL:alert, no distress and comfortable SKIN: skin color, texture, turgor are normal, no rashes or significant lesions EYES: normal, Conjunctiva are pink and non-injected, sclera clear OROPHARYNX:no exudate, no erythema and lips, buccal mucosa, and tongue normal  NECK: supple, thyroid normal size, non-tender, without nodularity LYMPH:  no palpable lymphadenopathy in the cervical, axillary or inguinal LUNGS: clear to auscultation and percussion with normal breathing effort HEART: regular rate & rhythm and no murmurs and no lower extremity edema ABDOMEN:abdomen  soft, non-tender and normal bowel sounds Musculoskeletal:no cyanosis of digits and no clubbing  NEURO: alert & oriented x 3 with fluent speech, no focal motor/sensory deficits  LABORATORY DATA:  I have reviewed the data as listed CBC Latest Ref Rng & Units 05/23/2018 05/11/2018 05/09/2018  WBC 4.0 - 10.5 K/uL 4.6 6.3 6.6  Hemoglobin 12.0 - 15.0 g/dL 10.1(L) 8.1(L) 8.3(L)  Hematocrit 36.0 - 46.0 % 31.6(L) 26.0(L) 26.1(L)  Platelets 150 - 400 K/uL 147(L) 197 197     CMP Latest Ref Rng & Units 05/23/2018 04/19/2018 03/08/2018  Glucose 70 - 99 mg/dL 316(H) 331(H) 367(H)  BUN 8 - 23 mg/dL 19 21 17   Creatinine 0.44 - 1.00 mg/dL 1.44(H) 1.66(H) 1.29(H)  Sodium 135 - 145 mmol/L 141 142  140  Potassium 3.5 - 5.1 mmol/L 4.3 4.2 4.3  Chloride 98 - 111 mmol/L 104 104 103  CO2 22 - 32 mmol/L 27 27 22   Calcium 8.9 - 10.3 mg/dL 9.4 9.2 8.8  Total Protein 6.5 - 8.1 g/dL 6.3(L) 6.0(L) -  Total Bilirubin 0.3 - 1.2 mg/dL 0.3 0.7 -  Alkaline Phos 38 - 126 U/L 117 87 -  AST 15 - 41 U/L 27 22 -  ALT 0 - 44 U/L 22 18 -      RADIOGRAPHIC STUDIES: I have personally reviewed the radiological images as listed and agreed with the findings in the report. No results found.   ASSESSMENT & PLAN:  [name] is a 78 y.o. female with   1. Hereditary hemorrhagic telangiectasia(HHT)), withepistaxis and frequent GI bleeding -She has mild epistaxis, and frequent GI bleeding, has required blood transfusion every 3 to 4 weeks. Her last blood transfusion was on 04/13/2018 -She started Avastin for q2weeks on 05/09/18. She moderately tolerated first infusion well due to fatigue and anorexia. This has now resolved.  -Labs reviewed, CBC and protein urine WNL except improved hg at 10.1, PLT 147K, CMP showed Cr 1.44 and glucose 316. Iron panel still pending. She has responded well to Avastin and Feraheme. Will proceed with both Avastin and IV Iron today. -continue Avastin every 2 weeks (may consider a break after 2-3 months  treatment), and Feraheme if ferritin<50   -F/u in 4 weeks   2. Fatigue and anorexia  -she developed significant fatigue and anorexia one day after Avastin infusion.   -Her symptoms are possible related to Avastin infusion, or hyperglycemia, will continue monitoring. -She feels better this week. Weight is stable and Vitals are stable.  -Hg at 10.1, She responded well. Will continue second dose Avastin today (05/23/18).   3. 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 Memory Loss  -She takes Ariceptand memantine -f/u with neurology   6. 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 [name] most recently prescribed percocet, I do not plan to refill -She takes tramadolalso -She continues to have stable leg pain that effects her walking. I discussed her pain is likely mainly caused by her uncontrolled DM.  -I encouraged her to follow up with her PCP soon and more frequently.  7. DM,CHF -She is on insulin  -She monitors her blood glucose at home -DM not well controlled -CHF stable clinically   Plan -Labs reviewed, HG 10.1, anemia much improved. Will proceed with Avastin and IV Feraheme today -Continue Avastin every 2 weeks, Feraheme if ferritin<50 -Lab and f/u in 4 weeks    No problem-specific Assessment & Plan notes found for this encounter.   No orders of the defined types were placed in this encounter.  All questions were answered. The patient knows to call the clinic with any  problems, questions or concerns. No barriers to learning was detected. I spent 15 minutes counseling the patient face to face. The total time spent in the appointment was 20 minutes and more than 50%  was on counseling and review of test results     Truitt Merle, MD 05/23/2018   I, Joslyn Devon, am acting as scribe for Truitt Merle, MD.   I have reviewed the above documentation for accuracy and completeness, and I agree with the above.

## 2018-05-23 ENCOUNTER — Inpatient Hospital Stay (HOSPITAL_BASED_OUTPATIENT_CLINIC_OR_DEPARTMENT_OTHER): Payer: Medicare Other | Admitting: Hematology

## 2018-05-23 ENCOUNTER — Inpatient Hospital Stay: Payer: Medicare Other

## 2018-05-23 ENCOUNTER — Encounter: Payer: Self-pay | Admitting: Hematology

## 2018-05-23 ENCOUNTER — Telehealth: Payer: Self-pay | Admitting: Hematology

## 2018-05-23 ENCOUNTER — Other Ambulatory Visit: Payer: Self-pay

## 2018-05-23 VITALS — BP 149/49 | HR 61 | Temp 99.6°F | Resp 20 | Ht 63.0 in | Wt 146.5 lb

## 2018-05-23 DIAGNOSIS — D500 Iron deficiency anemia secondary to blood loss (chronic): Secondary | ICD-10-CM

## 2018-05-23 DIAGNOSIS — I780 Hereditary hemorrhagic telangiectasia: Secondary | ICD-10-CM

## 2018-05-23 DIAGNOSIS — E1165 Type 2 diabetes mellitus with hyperglycemia: Secondary | ICD-10-CM

## 2018-05-23 DIAGNOSIS — K31819 Angiodysplasia of stomach and duodenum without bleeding: Secondary | ICD-10-CM

## 2018-05-23 DIAGNOSIS — K922 Gastrointestinal hemorrhage, unspecified: Secondary | ICD-10-CM

## 2018-05-23 DIAGNOSIS — R040 Epistaxis: Secondary | ICD-10-CM

## 2018-05-23 DIAGNOSIS — M79605 Pain in left leg: Secondary | ICD-10-CM

## 2018-05-23 DIAGNOSIS — M542 Cervicalgia: Secondary | ICD-10-CM

## 2018-05-23 DIAGNOSIS — D509 Iron deficiency anemia, unspecified: Secondary | ICD-10-CM

## 2018-05-23 DIAGNOSIS — Z79899 Other long term (current) drug therapy: Secondary | ICD-10-CM

## 2018-05-23 DIAGNOSIS — R413 Other amnesia: Secondary | ICD-10-CM

## 2018-05-23 DIAGNOSIS — Z794 Long term (current) use of insulin: Secondary | ICD-10-CM

## 2018-05-23 DIAGNOSIS — M79604 Pain in right leg: Secondary | ICD-10-CM

## 2018-05-23 DIAGNOSIS — D508 Other iron deficiency anemias: Secondary | ICD-10-CM

## 2018-05-23 DIAGNOSIS — I5032 Chronic diastolic (congestive) heart failure: Secondary | ICD-10-CM

## 2018-05-23 DIAGNOSIS — K219 Gastro-esophageal reflux disease without esophagitis: Secondary | ICD-10-CM

## 2018-05-23 DIAGNOSIS — R5383 Other fatigue: Secondary | ICD-10-CM

## 2018-05-23 DIAGNOSIS — Z95828 Presence of other vascular implants and grafts: Secondary | ICD-10-CM | POA: Insufficient documentation

## 2018-05-23 DIAGNOSIS — E785 Hyperlipidemia, unspecified: Secondary | ICD-10-CM

## 2018-05-23 DIAGNOSIS — I110 Hypertensive heart disease with heart failure: Secondary | ICD-10-CM

## 2018-05-23 DIAGNOSIS — Q2733 Arteriovenous malformation of digestive system vessel: Secondary | ICD-10-CM

## 2018-05-23 DIAGNOSIS — R630 Anorexia: Secondary | ICD-10-CM

## 2018-05-23 LAB — CBC WITH DIFFERENTIAL (CANCER CENTER ONLY)
Abs Immature Granulocytes: 0.01 10*3/uL (ref 0.00–0.07)
Basophils Absolute: 0 10*3/uL (ref 0.0–0.1)
Basophils Relative: 1 %
Eosinophils Absolute: 0.3 10*3/uL (ref 0.0–0.5)
Eosinophils Relative: 5 %
HCT: 31.6 % — ABNORMAL LOW (ref 36.0–46.0)
Hemoglobin: 10.1 g/dL — ABNORMAL LOW (ref 12.0–15.0)
Immature Granulocytes: 0 %
Lymphocytes Relative: 12 %
Lymphs Abs: 0.5 10*3/uL — ABNORMAL LOW (ref 0.7–4.0)
MCH: 31.8 pg (ref 26.0–34.0)
MCHC: 32 g/dL (ref 30.0–36.0)
MCV: 99.4 fL (ref 80.0–100.0)
Monocytes Absolute: 0.6 10*3/uL (ref 0.1–1.0)
Monocytes Relative: 12 %
Neutro Abs: 3.3 10*3/uL (ref 1.7–7.7)
Neutrophils Relative %: 70 %
Platelet Count: 147 10*3/uL — ABNORMAL LOW (ref 150–400)
RBC: 3.18 MIL/uL — ABNORMAL LOW (ref 3.87–5.11)
RDW: 15.1 % (ref 11.5–15.5)
WBC Count: 4.6 10*3/uL (ref 4.0–10.5)
nRBC: 0 % (ref 0.0–0.2)

## 2018-05-23 LAB — CMP (CANCER CENTER ONLY)
ALT: 22 U/L (ref 0–44)
AST: 27 U/L (ref 15–41)
Albumin: 3 g/dL — ABNORMAL LOW (ref 3.5–5.0)
Alkaline Phosphatase: 117 U/L (ref 38–126)
Anion gap: 10 (ref 5–15)
BUN: 19 mg/dL (ref 8–23)
CO2: 27 mmol/L (ref 22–32)
Calcium: 9.4 mg/dL (ref 8.9–10.3)
Chloride: 104 mmol/L (ref 98–111)
Creatinine: 1.44 mg/dL — ABNORMAL HIGH (ref 0.44–1.00)
GFR, Est AFR Am: 40 mL/min — ABNORMAL LOW (ref >60)
GFR, Estimated: 35 mL/min — ABNORMAL LOW (ref >60)
Glucose, Bld: 316 mg/dL — ABNORMAL HIGH (ref 70–99)
Potassium: 4.3 mmol/L (ref 3.5–5.1)
Sodium: 141 mmol/L (ref 135–145)
Total Bilirubin: 0.3 mg/dL (ref 0.3–1.2)
Total Protein: 6.3 g/dL — ABNORMAL LOW (ref 6.5–8.1)

## 2018-05-23 LAB — FERRITIN: Ferritin: 186 ng/mL (ref 11–307)

## 2018-05-23 LAB — TOTAL PROTEIN, URINE DIPSTICK: Protein, ur: NEGATIVE mg/dL

## 2018-05-23 MED ORDER — HEPARIN SOD (PORK) LOCK FLUSH 100 UNIT/ML IV SOLN
500.0000 [IU] | Freq: Once | INTRAVENOUS | Status: AC | PRN
  Administered 2018-05-23: 500 [IU]
  Filled 2018-05-23: qty 5

## 2018-05-23 MED ORDER — SODIUM CHLORIDE 0.9% FLUSH
10.0000 mL | INTRAVENOUS | Status: DC | PRN
  Administered 2018-05-23: 13:00:00 10 mL
  Filled 2018-05-23: qty 10

## 2018-05-23 MED ORDER — SODIUM CHLORIDE 0.9% FLUSH
10.0000 mL | INTRAVENOUS | Status: DC | PRN
  Filled 2018-05-23: qty 10

## 2018-05-23 MED ORDER — SODIUM CHLORIDE 0.9 % IV SOLN
Freq: Once | INTRAVENOUS | Status: AC
  Administered 2018-05-23: 12:00:00 via INTRAVENOUS
  Filled 2018-05-23: qty 250

## 2018-05-23 MED ORDER — SODIUM CHLORIDE 0.9 % IV SOLN
300.0000 mg | Freq: Once | INTRAVENOUS | Status: AC
  Administered 2018-05-23: 300 mg via INTRAVENOUS
  Filled 2018-05-23: qty 12

## 2018-05-23 MED ORDER — SODIUM CHLORIDE 0.9 % IV SOLN
510.0000 mg | Freq: Once | INTRAVENOUS | Status: AC
  Administered 2018-05-23: 510 mg via INTRAVENOUS
  Filled 2018-05-23: qty 17

## 2018-05-23 MED ORDER — SODIUM CHLORIDE 0.9% FLUSH
10.0000 mL | INTRAVENOUS | Status: DC | PRN
  Administered 2018-05-23: 10 mL
  Filled 2018-05-23: qty 10

## 2018-05-23 NOTE — Telephone Encounter (Signed)
No los per 4/13. °

## 2018-05-23 NOTE — Progress Notes (Signed)
Patient is to receive Avastin and feraheme today per Dr. Burr Medico.   Demetrius Charity, PharmD, [facility] Pharmacist Pharmacy Phone: [phone] 05/23/2018

## 2018-05-23 NOTE — Patient Instructions (Signed)
[place]  Today you received the following: Avastin   If you develop nausea and vomiting that is not controlled by your nausea medication, call the clinic.   BELOW ARE SYMPTOMS THAT SHOULD BE REPORTED IMMEDIATELY:  *FEVER GREATER THAN 100.5 F  *CHILLS WITH OR WITHOUT FEVER  NAUSEA AND VOMITING THAT IS NOT CONTROLLED WITH YOUR NAUSEA MEDICATION  *UNUSUAL SHORTNESS OF BREATH  *UNUSUAL BRUISING OR BLEEDING  TENDERNESS IN MOUTH AND THROAT WITH OR WITHOUT PRESENCE OF ULCERS  *URINARY PROBLEMS  *BOWEL PROBLEMS  UNUSUAL RASH Items with * indicate a potential emergency and should be followed up as soon as possible.  Feel free to call the clinic should you have any questions or concerns. The clinic phone number is [phone].  Please show the [place] at check-in to the Emergency Department and triage nurse.  Bevacizumab (Avastin) injection What is this medicine? BEVACIZUMAB (be va SIZ yoo mab) is a monoclonal antibody. It is used to treat many types of cancer. This medicine may be used for other purposes; ask your health care provider or pharmacist if you have questions. COMMON BRAND NAME(S): Avastin, MVASI What should I tell my health care provider before I take this medicine? They need to know if you have any of these conditions: -diabetes -heart disease -high blood pressure -history of coughing up blood -prior anthracycline chemotherapy (e.g., doxorubicin, daunorubicin, epirubicin) -recent or ongoing radiation therapy -recent or planning to have surgery -stroke -an unusual or allergic reaction to bevacizumab, hamster proteins, mouse proteins, other medicines, foods, dyes, or preservatives -pregnant or trying to get pregnant -breast-feeding How should I use this medicine? This medicine is for infusion into a vein. It is given by a health care professional in a hospital or clinic setting. Talk to your pediatrician regarding the use of this  medicine in children. Special care may be needed. Overdosage: If you think you have taken too much of this medicine contact a poison control center or emergency room at once. NOTE: This medicine is only for you. Do not share this medicine with others. What if I miss a dose? It is important not to miss your dose. Call your doctor or health care professional if you are unable to keep an appointment. What may interact with this medicine? Interactions are not expected. This list may not describe all possible interactions. Give your health care provider a list of all the medicines, herbs, non-prescription drugs, or dietary supplements you use. Also tell them if you smoke, drink alcohol, or use illegal drugs. Some items may interact with your medicine. What should I watch for while using this medicine? Your condition will be monitored carefully while you are receiving this medicine. You will need important blood work and urine testing done while you are taking this medicine. This medicine may increase your risk to bruise or bleed. Call your doctor or health care professional if you notice any unusual bleeding. This medicine should be started at least 28 days following major surgery and the site of the surgery should be totally healed. Check with your doctor before scheduling dental work or surgery while you are receiving this treatment. Talk to your doctor if you have recently had surgery or if you have a wound that has not healed. Do not become pregnant while taking this medicine or for 6 months after stopping it. Women should inform their doctor if they wish to become pregnant or think they might be pregnant. There is a potential for  serious side effects to an unborn child. Talk to your health care professional or pharmacist for more information. Do not breast-feed an infant while taking this medicine and for 6 months after the last dose. This medicine has caused ovarian failure in some women. This medicine  may interfere with the ability to have a child. You should talk to your doctor or health care professional if you are concerned about your fertility. What side effects may I notice from receiving this medicine? Side effects that you should report to your doctor or health care professional as soon as possible: -allergic reactions like skin rash, itching or hives, swelling of the face, lips, or tongue -chest pain or chest tightness -chills -coughing up blood -high fever -seizures -severe constipation -signs and symptoms of bleeding such as bloody or black, tarry stools; red or dark-brown urine; spitting up blood or brown material that looks like coffee grounds; red spots on the skin; unusual bruising or bleeding from the eye, gums, or nose -signs and symptoms of a blood clot such as breathing problems; chest pain; severe, sudden headache; pain, swelling, warmth in the leg -signs and symptoms of a stroke like changes in vision; confusion; trouble speaking or understanding; severe headaches; sudden numbness or weakness of the face, arm or leg; trouble walking; dizziness; loss of balance or coordination -stomach pain -sweating -swelling of legs or ankles -vomiting -weight gain Side effects that usually do not require medical attention (report to your doctor or health care professional if they continue or are bothersome): -back pain -changes in taste -decreased appetite -dry skin -nausea -tiredness This list may not describe all possible side effects. Call your doctor for medical advice about side effects. You may report side effects to FDA at 1-800-FDA-1088. Where should I keep my medicine? This drug is given in a hospital or clinic and will not be stored at home. NOTE: This sheet is a summary. It may not cover all possible information. If you have questions about this medicine, talk to your doctor, pharmacist, or health care provider.  2019 Elsevier/Gold Standard (2016-01-24  14:33:29)   Ferumoxytol injection What is this medicine? FERUMOXYTOL is an iron complex. Iron is used to make healthy red blood cells, which carry oxygen and nutrients throughout the body. This medicine is used to treat iron deficiency anemia. This medicine may be used for other purposes; ask your health care provider or pharmacist if you have questions. COMMON BRAND NAME(S): Feraheme What should I tell my health care provider before I take this medicine? They need to know if you have any of these conditions: -anemia not caused by low iron levels -high levels of iron in the blood -magnetic resonance imaging (MRI) test scheduled -an unusual or allergic reaction to iron, other medicines, foods, dyes, or preservatives -pregnant or trying to get pregnant -breast-feeding How should I use this medicine? This medicine is for injection into a vein. It is given by a health care professional in a hospital or clinic setting. Talk to your pediatrician regarding the use of this medicine in children. Special care may be needed. Overdosage: If you think you have taken too much of this medicine contact a poison control center or emergency room at once. NOTE: This medicine is only for you. Do not share this medicine with others. What if I miss a dose? It is important not to miss your dose. Call your doctor or health care professional if you are unable to keep an appointment. What may interact with this medicine?  This medicine may interact with the following medications: -other iron products This list may not describe all possible interactions. Give your health care provider a list of all the medicines, herbs, non-prescription drugs, or dietary supplements you use. Also tell them if you smoke, drink alcohol, or use illegal drugs. Some items may interact with your medicine. What should I watch for while using this medicine? Visit your doctor or healthcare professional regularly. Tell your doctor or  healthcare professional if your symptoms do not start to get better or if they get worse. You may need blood work done while you are taking this medicine. You may need to follow a special diet. Talk to your doctor. Foods that contain iron include: whole grains/cereals, dried fruits, beans, or peas, leafy green vegetables, and organ meats (liver, kidney). What side effects may I notice from receiving this medicine? Side effects that you should report to your doctor or health care professional as soon as possible: -allergic reactions like skin rash, itching or hives, swelling of the face, lips, or tongue -breathing problems -changes in blood pressure -feeling faint or lightheaded, falls -fever or chills -flushing, sweating, or hot feelings -swelling of the ankles or feet Side effects that usually do not require medical attention (report to your doctor or health care professional if they continue or are bothersome): -diarrhea -headache -nausea, vomiting -stomach pain This list may not describe all possible side effects. Call your doctor for medical advice about side effects. You may report side effects to FDA at 1-800-FDA-1088. Where should I keep my medicine? This drug is given in a hospital or clinic and will not be stored at home. NOTE: This sheet is a summary. It may not cover all possible information. If you have questions about this medicine, talk to your doctor, pharmacist, or health care provider.  2019 Elsevier/Gold Standard (2016-03-16 20:21:10)

## 2018-05-24 ENCOUNTER — Telehealth: Payer: Self-pay | Admitting: *Deleted

## 2018-05-24 NOTE — Telephone Encounter (Signed)
TCT pt's home, spoke with pt's family member. Advised regarding elevated Glucose level. They will f/u with pt's PCP

## 2018-05-24 NOTE — Telephone Encounter (Signed)
-----   Message from Truitt Merle, MD sent at 05/24/2018 12:37 PM EDT -----  ----- Message ----- From: Truitt Merle, MD Sent: 05/23/2018  10:53 PM EDT To: [facility]  [place], please let pt's family (pt has some degree dementia) know that her blood glucose has been high, over 300s, she needs to f/u with her PCP to adjust her insulin, thanks  Truitt Merle  05/23/2018

## 2018-05-25 ENCOUNTER — Other Ambulatory Visit: Payer: Self-pay | Admitting: Internal Medicine

## 2018-05-30 ENCOUNTER — Telehealth: Payer: Self-pay

## 2018-05-30 NOTE — Telephone Encounter (Signed)
Spoke with patients husband as a follow up to an after hours call on Friday regarding nose bleed.  He states she had a [place] nose bleed yesterday afternoon didn't last long.  She is applying pressure pinching the nostrils when she has one.  Instructed them to call back has another nose bleed not stopping.  He verbalized an understanding.

## 2018-06-01 ENCOUNTER — Telehealth: Payer: Self-pay | Admitting: Pharmacist

## 2018-06-01 NOTE — Progress Notes (Addendum)
Diabetes Medication Management Follow Up Verified patient identity using address and date of birth Blood sugar: home average is still in the 200s (correlates with A1C of around 9%) Diabetes Medicine: insulin degludec Tyler Aas) 20 units daily, dulaglutide Tyler Aas) 1.5 mg weekly Hypoglycemia symptoms: none, no home BG < 70 this past week (lowest 90) Hyperglycemia symptoms: none---1 home BG > 300 (355) this past week  Patient reported the result of 355 was fasting (prior to breakfast). We did discuss dietary habits but were unable to pinpoint significant contributions. She states for breakfast she eats plain oatmeal, a boiled egg, and 1 slice of plain wheat toast. She does put sugar in her coffee, I advised exploring sugar substitutes. For lunch and dinner, she reports either salad or Glucerna. Sometimes she eats sandwiches, I advised on healthy bread options and to try to minimize carbohydrates.  Since home BG relatively elevated past few encounters, we increased insulin degludec to 22 units daily. We hesitated to make this change due to history of hypoglycemia when taking 22 units daily, but will follow up with patient in 1 week to evaluate. Will notify PCP  Patient and husband verbalized understanding.  [name], PharmD 05/16/2018 12:06 PM

## 2018-06-06 ENCOUNTER — Other Ambulatory Visit: Payer: Self-pay

## 2018-06-06 ENCOUNTER — Other Ambulatory Visit: Payer: Self-pay | Admitting: Internal Medicine

## 2018-06-06 ENCOUNTER — Inpatient Hospital Stay: Payer: Medicare Other

## 2018-06-06 VITALS — BP 109/42 | HR 66 | Temp 98.1°F | Resp 18

## 2018-06-06 DIAGNOSIS — K31819 Angiodysplasia of stomach and duodenum without bleeding: Secondary | ICD-10-CM

## 2018-06-06 DIAGNOSIS — D500 Iron deficiency anemia secondary to blood loss (chronic): Secondary | ICD-10-CM

## 2018-06-06 DIAGNOSIS — I780 Hereditary hemorrhagic telangiectasia: Secondary | ICD-10-CM | POA: Diagnosis not present

## 2018-06-06 DIAGNOSIS — D508 Other iron deficiency anemias: Secondary | ICD-10-CM

## 2018-06-06 DIAGNOSIS — Z95828 Presence of other vascular implants and grafts: Secondary | ICD-10-CM

## 2018-06-06 DIAGNOSIS — K31811 Angiodysplasia of stomach and duodenum with bleeding: Secondary | ICD-10-CM

## 2018-06-06 LAB — CBC WITH DIFFERENTIAL (CANCER CENTER ONLY)
Abs Immature Granulocytes: 0.02 10*3/uL (ref 0.00–0.07)
Basophils Absolute: 0 10*3/uL (ref 0.0–0.1)
Basophils Relative: 1 %
Eosinophils Absolute: 0.1 10*3/uL (ref 0.0–0.5)
Eosinophils Relative: 3 %
HCT: 27.4 % — ABNORMAL LOW (ref 36.0–46.0)
Hemoglobin: 8.6 g/dL — ABNORMAL LOW (ref 12.0–15.0)
Immature Granulocytes: 0 %
Lymphocytes Relative: 5 %
Lymphs Abs: 0.3 10*3/uL — ABNORMAL LOW (ref 0.7–4.0)
MCH: 31.6 pg (ref 26.0–34.0)
MCHC: 31.4 g/dL (ref 30.0–36.0)
MCV: 100.7 fL — ABNORMAL HIGH (ref 80.0–100.0)
Monocytes Absolute: 0.4 10*3/uL (ref 0.1–1.0)
Monocytes Relative: 9 %
Neutro Abs: 3.9 10*3/uL (ref 1.7–7.7)
Neutrophils Relative %: 82 %
Platelet Count: 167 10*3/uL (ref 150–400)
RBC: 2.72 MIL/uL — ABNORMAL LOW (ref 3.87–5.11)
RDW: 14.3 % (ref 11.5–15.5)
WBC Count: 4.7 10*3/uL (ref 4.0–10.5)
nRBC: 0 % (ref 0.0–0.2)

## 2018-06-06 MED ORDER — SODIUM CHLORIDE 0.9% FLUSH
10.0000 mL | INTRAVENOUS | Status: DC | PRN
  Administered 2018-06-06: 10 mL
  Filled 2018-06-06: qty 10

## 2018-06-06 MED ORDER — SODIUM CHLORIDE 0.9 % IV SOLN
510.0000 mg | Freq: Once | INTRAVENOUS | Status: AC
  Administered 2018-06-06: 510 mg via INTRAVENOUS
  Filled 2018-06-06: qty 17

## 2018-06-06 MED ORDER — HEPARIN SOD (PORK) LOCK FLUSH 100 UNIT/ML IV SOLN
500.0000 [IU] | Freq: Once | INTRAVENOUS | Status: AC | PRN
  Administered 2018-06-06: 500 [IU]
  Filled 2018-06-06: qty 5

## 2018-06-06 MED ORDER — SODIUM CHLORIDE 0.9 % IV SOLN
300.0000 mg | Freq: Once | INTRAVENOUS | Status: AC
  Administered 2018-06-06: 13:00:00 300 mg via INTRAVENOUS
  Filled 2018-06-06: qty 12

## 2018-06-06 MED ORDER — SODIUM CHLORIDE 0.9 % IV SOLN
Freq: Once | INTRAVENOUS | Status: AC
  Administered 2018-06-06: 12:00:00 via INTRAVENOUS
  Filled 2018-06-06: qty 250

## 2018-06-06 NOTE — Patient Instructions (Signed)
[place]  Today you received the following: Avastin   If you develop nausea and vomiting that is not controlled by your nausea medication, call the clinic.   BELOW ARE SYMPTOMS THAT SHOULD BE REPORTED IMMEDIATELY:  *FEVER GREATER THAN 100.5 F  *CHILLS WITH OR WITHOUT FEVER  NAUSEA AND VOMITING THAT IS NOT CONTROLLED WITH YOUR NAUSEA MEDICATION  *UNUSUAL SHORTNESS OF BREATH  *UNUSUAL BRUISING OR BLEEDING  TENDERNESS IN MOUTH AND THROAT WITH OR WITHOUT PRESENCE OF ULCERS  *URINARY PROBLEMS  *BOWEL PROBLEMS  UNUSUAL RASH Items with * indicate a potential emergency and should be followed up as soon as possible.  Feel free to call the clinic should you have any questions or concerns. The clinic phone number is [phone].  Please show the [facility] at check-in to the Emergency Department and triage nurse.  Bevacizumab (Avastin) injection What is this medicine? BEVACIZUMAB (be va SIZ yoo mab) is a monoclonal antibody. It is used to treat many types of cancer. This medicine may be used for other purposes; ask your health care provider or pharmacist if you have questions. COMMON BRAND NAME(S): Avastin, MVASI What should I tell my health care provider before I take this medicine? They need to know if you have any of these conditions: -diabetes -heart disease -high blood pressure -history of coughing up blood -prior anthracycline chemotherapy (e.g., doxorubicin, daunorubicin, epirubicin) -recent or ongoing radiation therapy -recent or planning to have surgery -stroke -an unusual or allergic reaction to bevacizumab, hamster proteins, mouse proteins, other medicines, foods, dyes, or preservatives -pregnant or trying to get pregnant -breast-feeding How should I use this medicine? This medicine is for infusion into a vein. It is given by a health care professional in a hospital or clinic setting. Talk to your pediatrician regarding the use of this  medicine in children. Special care may be needed. Overdosage: If you think you have taken too much of this medicine contact a poison control center or emergency room at once. NOTE: This medicine is only for you. Do not share this medicine with others. What if I miss a dose? It is important not to miss your dose. Call your doctor or health care professional if you are unable to keep an appointment. What may interact with this medicine? Interactions are not expected. This list may not describe all possible interactions. Give your health care provider a list of all the medicines, herbs, non-prescription drugs, or dietary supplements you use. Also tell them if you smoke, drink alcohol, or use illegal drugs. Some items may interact with your medicine. What should I watch for while using this medicine? Your condition will be monitored carefully while you are receiving this medicine. You will need important blood work and urine testing done while you are taking this medicine. This medicine may increase your risk to bruise or bleed. Call your doctor or health care professional if you notice any unusual bleeding. This medicine should be started at least 28 days following major surgery and the site of the surgery should be totally healed. Check with your doctor before scheduling dental work or surgery while you are receiving this treatment. Talk to your doctor if you have recently had surgery or if you have a wound that has not healed. Do not become pregnant while taking this medicine or for 6 months after stopping it. Women should inform their doctor if they wish to become pregnant or think they might be pregnant. There is a potential for  serious side effects to an unborn child. Talk to your health care professional or pharmacist for more information. Do not breast-feed an infant while taking this medicine and for 6 months after the last dose. This medicine has caused ovarian failure in some women. This medicine  may interfere with the ability to have a child. You should talk to your doctor or health care professional if you are concerned about your fertility. What side effects may I notice from receiving this medicine? Side effects that you should report to your doctor or health care professional as soon as possible: -allergic reactions like skin rash, itching or hives, swelling of the face, lips, or tongue -chest pain or chest tightness -chills -coughing up blood -high fever -seizures -severe constipation -signs and symptoms of bleeding such as bloody or black, tarry stools; red or dark-brown urine; spitting up blood or brown material that looks like coffee grounds; red spots on the skin; unusual bruising or bleeding from the eye, gums, or nose -signs and symptoms of a blood clot such as breathing problems; chest pain; severe, sudden headache; pain, swelling, warmth in the leg -signs and symptoms of a stroke like changes in vision; confusion; trouble speaking or understanding; severe headaches; sudden numbness or weakness of the face, arm or leg; trouble walking; dizziness; loss of balance or coordination -stomach pain -sweating -swelling of legs or ankles -vomiting -weight gain Side effects that usually do not require medical attention (report to your doctor or health care professional if they continue or are bothersome): -back pain -changes in taste -decreased appetite -dry skin -nausea -tiredness This list may not describe all possible side effects. Call your doctor for medical advice about side effects. You may report side effects to FDA at 1-800-FDA-1088. Where should I keep my medicine? This drug is given in a hospital or clinic and will not be stored at home. NOTE: This sheet is a summary. It may not cover all possible information. If you have questions about this medicine, talk to your doctor, pharmacist, or health care provider.  2019 Elsevier/Gold Standard (2016-01-24  14:33:29)   Ferumoxytol injection What is this medicine? FERUMOXYTOL is an iron complex. Iron is used to make healthy red blood cells, which carry oxygen and nutrients throughout the body. This medicine is used to treat iron deficiency anemia. This medicine may be used for other purposes; ask your health care provider or pharmacist if you have questions. COMMON BRAND NAME(S): Feraheme What should I tell my health care provider before I take this medicine? They need to know if you have any of these conditions: -anemia not caused by low iron levels -high levels of iron in the blood -magnetic resonance imaging (MRI) test scheduled -an unusual or allergic reaction to iron, other medicines, foods, dyes, or preservatives -pregnant or trying to get pregnant -breast-feeding How should I use this medicine? This medicine is for injection into a vein. It is given by a health care professional in a hospital or clinic setting. Talk to your pediatrician regarding the use of this medicine in children. Special care may be needed. Overdosage: If you think you have taken too much of this medicine contact a poison control center or emergency room at once. NOTE: This medicine is only for you. Do not share this medicine with others. What if I miss a dose? It is important not to miss your dose. Call your doctor or health care professional if you are unable to keep an appointment. What may interact with this medicine?  This medicine may interact with the following medications: -other iron products This list may not describe all possible interactions. Give your health care provider a list of all the medicines, herbs, non-prescription drugs, or dietary supplements you use. Also tell them if you smoke, drink alcohol, or use illegal drugs. Some items may interact with your medicine. What should I watch for while using this medicine? Visit your doctor or healthcare professional regularly. Tell your doctor or  healthcare professional if your symptoms do not start to get better or if they get worse. You may need blood work done while you are taking this medicine. You may need to follow a special diet. Talk to your doctor. Foods that contain iron include: whole grains/cereals, dried fruits, beans, or peas, leafy green vegetables, and organ meats (liver, kidney). What side effects may I notice from receiving this medicine? Side effects that you should report to your doctor or health care professional as soon as possible: -allergic reactions like skin rash, itching or hives, swelling of the face, lips, or tongue -breathing problems -changes in blood pressure -feeling faint or lightheaded, falls -fever or chills -flushing, sweating, or hot feelings -swelling of the ankles or feet Side effects that usually do not require medical attention (report to your doctor or health care professional if they continue or are bothersome): -diarrhea -headache -nausea, vomiting -stomach pain This list may not describe all possible side effects. Call your doctor for medical advice about side effects. You may report side effects to FDA at 1-800-FDA-1088. Where should I keep my medicine? This drug is given in a hospital or clinic and will not be stored at home. NOTE: This sheet is a summary. It may not cover all possible information. If you have questions about this medicine, talk to your doctor, pharmacist, or health care provider.  2019 Elsevier/Gold Standard (2016-03-16 20:21:10)

## 2018-06-06 NOTE — Progress Notes (Signed)
Proceed with Feraheme today due to worsening anemia per MD.  Hardie Pulley, PharmD, BCPS, BCOP

## 2018-06-16 ENCOUNTER — Other Ambulatory Visit: Payer: Self-pay | Admitting: Internal Medicine

## 2018-06-17 NOTE — Progress Notes (Signed)
[place]   [phone] [phone]   Clinic Follow up Note   Patient Care Team: Delice Bison, DO as PCP - General [name], MD as PCP - Cardiology (Cardiology)  Date of Service:  06/20/2018  CHIEF COMPLAINT: F/u of Anemia due to chronic blood loss, HHT  HEMATOLOGY HISTORY 1.Hereditary hemorrhagic telangiectasia (HHT),with epistaxis andfrequentGI bleeding. Previously under care of Dr. [name] 2.Anemia secondary to GI bleeding and iron deficiency, required blood transfusion every 3 to 4 weeks, IV Feraheme every 1 to 2 months  CURRENT THERAPY 1. Iv feraheme as needed if ferritin<50 2.Avastin5mg /kg every 2 weeks, starting 05/09/18 3. Blood transfusion if Hg<8.0, (now <7 due to COVID-19 restrictions.)  INTERVAL HISTORY:  [place] is here for a follow up of anemia and HHT. She presents to the clinic today by herself. She notes she is doing well. She notes her epistaxis is less and has no other signs of bleeding.    REVIEW OF SYSTEMS:   Constitutional: Denies fevers, chills or abnormal weight loss Eyes: Denies blurriness of vision Ears, nose, mouth, throat, and face: Denies mucositis or sore throat (+) Less epistaxis Respiratory: Denies cough, dyspnea or wheezes Cardiovascular: Denies palpitation, chest discomfort or lower extremity swelling Gastrointestinal:  Denies nausea, heartburn or change in bowel habits Skin: Denies abnormal skin rashes Lymphatics: Denies new lymphadenopathy or easy bruising Neurological:Denies numbness, tingling or new weaknesses Behavioral/Psych: Mood is stable, no new changes  All other systems were reviewed with the patient and are negative.  MEDICAL HISTORY:  Past Medical History:  Diagnosis Date  . Chronic anemia   . Chronic diastolic CHF (congestive heart failure) ([place]) 10/03/2013  . Chronic GI bleeding    [name] 11/29/2014  . Family history of anesthesia complication    "niece  has a hard time coming out" (09/15/2012)  . Frequent nosebleeds    chronic  . Gastric AV malformation    [name] 11/29/2014  . GERD (gastroesophageal reflux disease)   . 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 History of blood transfusion "several"  . History of epistaxis   . HTN (hypertension), benign 03/02/2012  . Hyperlipidemia   . Iron deficiency anemia    chronic infusions"  . Lichen planus    Both lower extremities  . 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 Overgrown toenails 03/18/2017  . Pneumonia 1990's X 2  . Pulmonary HTN ([place]) 04/01/2017   PASP 33mmHg on echo 09/2016  . Seizures ([place]) 09/2014  . Symptomatic anemia 11/29/2014  . Telangiectasia    Gastric   . 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    SURGICAL HISTORY: Past Surgical History:  Procedure Laterality Date  . CATARACT EXTRACTION     "I think it was just one eye"  . ESOPHAGOGASTRODUODENOSCOPY  02/26/2011   Procedure: ESOPHAGOGASTRODUODENOSCOPY (EGD);  Surgeon: [name], MD;  Location: [name];  Service: Endoscopy;  Laterality: N/A;  . ESOPHAGOGASTRODUODENOSCOPY N/A 11/08/2012   Procedure: ESOPHAGOGASTRODUODENOSCOPY (EGD);  Surgeon: [name], MD;  Location: [name];  Service: Endoscopy;  Laterality: N/A;  . ESOPHAGOGASTRODUODENOSCOPY N/A 10/04/2013   Procedure: ESOPHAGOGASTRODUODENOSCOPY (EGD);  Surgeon: Winfield Cunas., MD;  Location: [name];  Service: Endoscopy;  Laterality: N/A;  with APC on stand-by  . ESOPHAGOGASTRODUODENOSCOPY N/A 07/06/2014   Procedure: ESOPHAGOGASTRODUODENOSCOPY (EGD);  Surgeon: Clarene Essex, MD;  Location: [name];  Service: Endoscopy;  Laterality: N/A;  . ESOPHAGOGASTRODUODENOSCOPY  N/A 09/05/2014   Procedure: ESOPHAGOGASTRODUODENOSCOPY (EGD);  Surgeon: Laurence Spates, MD;  Location: [name];  Service: Endoscopy;  Laterality: N/A;  APC on standby to control bleeding  . ESOPHAGOGASTRODUODENOSCOPY N/A  11/29/2014   Procedure: ESOPHAGOGASTRODUODENOSCOPY (EGD);  Surgeon: [name], MD;  Location: [facility] ENDOSCOPY;  Service: Endoscopy;  Laterality: N/A;  . ESOPHAGOGASTRODUODENOSCOPY N/A 09/28/2015   Procedure: ESOPHAGOGASTRODUODENOSCOPY (EGD);  Surgeon: Clarene Essex, MD;  Location: [facility]COPY;  Service: Endoscopy;  Laterality: N/A;  . ESOPHAGOGASTRODUODENOSCOPY (EGD) WITH PROPOFOL N/A 12/04/2017   Procedure: ESOPHAGOGASTRODUODENOSCOPY (EGD) WITH PROPOFOL;  Surgeon: [name], MD;  Location: [facility];  Service: Endoscopy;  Laterality: N/A;  . ESOPHAGOGASTRODUODENOSCOPY ENDOSCOPY  08/19/2006   with laser treatment  . HOT HEMOSTASIS N/A 07/06/2014   Procedure: HOT HEMOSTASIS (ARGON PLASMA COAGULATION/BICAP);  Surgeon: Clarene Essex, MD;  Location: [name];  Service: Endoscopy;  Laterality: N/A;  . HOT HEMOSTASIS N/A 09/28/2015   Procedure: HOT HEMOSTASIS (ARGON PLASMA COAGULATION/BICAP);  Surgeon: Clarene Essex, MD;  Location: [facility] ENDOSCOPY;  Service: Endoscopy;  Laterality: N/A;  . HOT HEMOSTASIS N/A 12/04/2017   Procedure: HOT HEMOSTASIS (ARGON PLASMA COAGULATION/BICAP);  Surgeon: [name], MD;  Location: [place] [facility];  Service: Endoscopy;  Laterality: N/A;  . NASAL HEMORRHAGE CONTROL     "for bleeding"   . SAVORY DILATION  02/26/2011   Procedure: SAVORY DILATION;  Surgeon: [name], MD;  Location: [name];  Service: Endoscopy;  Laterality: N/A;  c-arm needed  . SUBMUCOSAL INJECTION  12/04/2017   Procedure: SUBMUCOSAL INJECTION;  Surgeon: [name], MD;  Location: [facility] ENDOSCOPY;  Service: Endoscopy;;    I have reviewed the social history and family history with the patient and they are unchanged from previous note.  ALLERGIES:  is allergic to aspirin.  MEDICATIONS:  Current Outpatient Medications  Medication Sig Dispense Refill  . augmented betamethasone dipropionate (DIPROLENE-AF) 0.05 % cream Apply 1 application topically daily.  2  . b complex vitamins  capsule Take 1 capsule by mouth daily. 100 capsule 2  . calcium citrate-vitamin D (CITRACAL+D) 315-200 MG-UNIT tablet Take 1 tablet by mouth 2 (two) times daily. 100 tablet 0  . cetaphil (CETAPHIL) lotion Apply 1 application topically as needed for dry skin. 236 mL 0  . donepezil (ARICEPT) 10 MG tablet Take 1 tablet (10 mg total) by mouth at bedtime. 90 tablet 4  . Dulaglutide (TRULICITY) 1.5 HL/4.5GY SOPN Inject 1.5 mg into the skin once a week. 4 pen 3  . Elastic Bandages & Supports (MEDICAL COMPRESSION STOCKINGS) MISC Wear as much as possible while awake to reduce swelling 2 each 0  . FERROCITE 324 MG TABS tablet TAKE 1 TABLET BY MOUTH TWICE DAILY 180 tablet 3  . furosemide (LASIX) 20 MG tablet Take 1 tablet (20 mg total) by mouth daily. 90 tablet 3  . glucose blood (ONETOUCH VERIO) test strip Use as instructed 4 times daily. E11.29, insulin requiring 400 each 3  . insulin degludec (TRESIBA FLEXTOUCH) 100 UNIT/ML SOPN FlexTouch Pen Inject 0.2 mLs (20 Units total) into the skin every morning. 6 pen 3  . Insulin Pen Needle (B-D UF III MINI PEN NEEDLES) 31G X 5 MM MISC USE TO INJECT INSULIN 3 TIMES DAILY 100 each 1  . lactulose (CHRONULAC) 10 GM/15ML solution TAKE 15 ML BY MOUTH TWO TIMES DAILY 473 mL 2  . levETIRAcetam (KEPPRA) 500 MG tablet Take 1 tablet (500 mg total) by mouth 2 (two) times daily. 180 tablet 4  . memantine (NAMENDA) 10 MG tablet Take  1 tablet (10 mg total) by mouth 2 (two) times daily. 180 tablet 4  . ONETOUCH DELICA LANCETS FINE MISC Check blood sugar up to 4 times a day 300 each 4  . oxycodone-acetaminophen (PERCOCET) 2.5-325 MG tablet Take 1-2 tablets by mouth at bedtime as needed for pain. 30 tablet 0  . oxymetazoline (AFRIN) 0.05 % nasal spray Place 1 spray into both nostrils 2 (two) times daily as needed (Epistaxis). 30 mL 0  . pantoprazole (PROTONIX) 40 MG tablet TAKE 1 TABLET BY MOUTH ONCE DAILY 180 tablet 0  . potassium chloride (K-DUR,KLOR-CON) 10 MEQ tablet TAKE 1  TABLET BY MOUTH ONCE DAILY 90 tablet 1  . sodium chloride (OCEAN) 0.65 % SOLN nasal spray Place 1 spray into both nostrils as needed for congestion. 1 Bottle 5  . traMADol (ULTRAM) 50 MG tablet Take 1 tablet (50 mg total) by mouth every 12 (twelve) hours as needed for moderate pain. 14 tablet 0  . triamcinolone ointment (KENALOG) 0.5 % APPLY 1 APPLICATION TOPICALLY TO RASH TWICE DAILY FOR ITCHING 15 g 1   No current facility-administered medications for this visit.    Facility-Administered Medications Ordered in Other Visits  Medication Dose Route Frequency Provider Last Rate Last Dose  . sodium chloride flush (NS) 0.9 % injection 10 mL  10 mL Intracatheter PRN Truitt Merle, MD   10 mL at 06/20/18 1233    PHYSICAL EXAMINATION: ECOG PERFORMANCE STATUS: 1 - Symptomatic but completely ambulatory  Vitals with BMI 06/20/2018  Height   Weight   BMI   Systolic 601  Diastolic 64  Pulse 73  Respirations 18    GENERAL:alert, no distress and comfortable SKIN: skin color, texture, turgor are normal, no rashes or significant lesions EYES: normal, Conjunctiva are pink and non-injected, sclera clear OROPHARYNX:no exudate, no erythema and lips, buccal mucosa, and tongue normal  NECK: supple, thyroid normal size, non-tender, without nodularity LYMPH:  no palpable lymphadenopathy in the cervical, axillary or inguinal LUNGS: clear to auscultation and percussion with normal breathing effort HEART: regular rate & rhythm and no murmurs and no lower extremity edema ABDOMEN:abdomen soft, non-tender and normal bowel sounds Musculoskeletal:no cyanosis of digits and no clubbing  NEURO: alert & oriented x 3 with fluent speech, no focal motor/sensory deficits  LABORATORY DATA:  I have reviewed the data as listed CBC Latest Ref Rng & Units 06/20/2018 06/06/2018 05/23/2018  WBC 4.0 - 10.5 K/uL 4.5 4.7 4.6  Hemoglobin 12.0 - 15.0 g/dL 12.1 8.6(L) 10.1(L)  Hematocrit 36.0 - 46.0 % 37.1 27.4(L) 31.6(L)  Platelets  150 - 400 K/uL 155 167 147(L)     CMP Latest Ref Rng & Units 06/20/2018 05/23/2018 04/19/2018  Glucose 70 - 99 mg/dL 343(H) 316(H) 331(H)  BUN 8 - 23 mg/dL 24(H) 19 21  Creatinine 0.44 - 1.00 mg/dL 1.57(H) 1.44(H) 1.66(H)  Sodium 135 - 145 mmol/L 143 141 142  Potassium 3.5 - 5.1 mmol/L 4.4 4.3 4.2  Chloride 98 - 111 mmol/L 108 104 104  CO2 22 - 32 mmol/L 24 27 27   Calcium 8.9 - 10.3 mg/dL 10.3 9.4 9.2  Total Protein 6.5 - 8.1 g/dL 7.0 6.3(L) 6.0(L)  Total Bilirubin 0.3 - 1.2 mg/dL 0.4 0.3 0.7  Alkaline Phos 38 - 126 U/L 119 117 87  AST 15 - 41 U/L 22 27 22   ALT 0 - 44 U/L 23 22 18       RADIOGRAPHIC STUDIES: I have personally reviewed the radiological images as listed and agreed with the findings  in the report. No results found.   ASSESSMENT & PLAN:  KORTNY LIRETTE is a 78 y.o. female with   1. Hereditary hemorrhagic telangiectasia(HHT)), withepistaxis and frequent GI bleeding -She has mild epistaxis, and frequent GI bleeding, has required blood transfusion every 3 to 4 weeks. Her last blood transfusion was on 04/13/2018 -She started Avastin for q2weeks on 05/09/18. She is tolerating well and responding well.  -Labs reviewed, CBC and CMP WNL except hyperglycemia and Cr 1.57, Iron panel is still pending. She has had excellent  response to Avastin, anemia resolved now. Will proceed with Avastin today, no need for blood transfusion or IV feraheme. -continue Avastin every 2 weeks for another infusion and then reduce to every 4 weeks, and continue IV Feraheme if ferritin<50   -F/u in 4 weeks   2. Fatigue and anorexia  -she developed significantfatigue and anorexia oneday after Avastin infusion.  -Her symptoms are possible related to Avastin infusion, or hyperglycemia, will continue monitoring. -She has responded well to Avastin. -Hg normalized today at 12.1 (06/20/18).   3. 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   4.Iron deficiency anemia secondary to #2  -She does not tolerate oral ironwell but is on it -will continueIV Feraheme if ferritin less than 50 -Last IV Feraheme 06/06/18  5.Memory Loss  -She takes Ariceptand memantine -f/u with neurology   6. 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 [name] previously prescribed percocet, I do not plan to refill -She takes tramadolalso -She continues to have stable leg pain that effects her walking. I discussed her pain is likely mainly caused by her uncontrolled DM.  -I encouraged her to follow up with her PCP soon and more frequently.  7. DM,CHF and CKD stage III -She is on insulin, DM not well controlled  -She monitors her blood glucose at home -CKD and CHF stable clinically   Plan -Labs reviewed, HG 12.1, anemia resolved. Will proceed with Avastin today -Continue Avastin every 2 weeks, Feraheme if ferritin<50 -Lab and f/u in 4 weeks    No problem-specific Assessment & Plan notes found for this encounter.   Orders Placed This Encounter  Procedures  . Sample to Blood Bank    Standing Status:   Future    Number of Occurrences:   1    Standing Expiration Date:   06/20/2019   All questions were answered. The patient knows to call the clinic with any problems, questions or concerns. No barriers to learning was detected. I spent 10 minutes counseling the patient face to face. The total time spent in the appointment was 15 minutes and more than 50% was on counseling and review of test results     Truitt Merle, MD 06/20/2018   I, Joslyn Devon, am acting as scribe for Truitt Merle, MD.   I have reviewed the above  documentation for accuracy and completeness, and I agree with the above.

## 2018-06-20 ENCOUNTER — Other Ambulatory Visit: Payer: Self-pay

## 2018-06-20 ENCOUNTER — Telehealth: Payer: Self-pay | Admitting: Hematology

## 2018-06-20 ENCOUNTER — Inpatient Hospital Stay (HOSPITAL_BASED_OUTPATIENT_CLINIC_OR_DEPARTMENT_OTHER): Payer: Medicare Other | Admitting: Hematology

## 2018-06-20 ENCOUNTER — Inpatient Hospital Stay: Payer: Medicare Other

## 2018-06-20 ENCOUNTER — Inpatient Hospital Stay: Payer: Medicare Other | Attending: Hematology

## 2018-06-20 ENCOUNTER — Encounter: Payer: Self-pay | Admitting: Hematology

## 2018-06-20 VITALS — BP 141/65 | HR 68 | Temp 98.1°F | Resp 18

## 2018-06-20 DIAGNOSIS — Z79899 Other long term (current) drug therapy: Secondary | ICD-10-CM | POA: Insufficient documentation

## 2018-06-20 DIAGNOSIS — R040 Epistaxis: Secondary | ICD-10-CM | POA: Insufficient documentation

## 2018-06-20 DIAGNOSIS — I5032 Chronic diastolic (congestive) heart failure: Secondary | ICD-10-CM | POA: Insufficient documentation

## 2018-06-20 DIAGNOSIS — E785 Hyperlipidemia, unspecified: Secondary | ICD-10-CM | POA: Diagnosis not present

## 2018-06-20 DIAGNOSIS — N183 Chronic kidney disease, stage 3 (moderate): Secondary | ICD-10-CM

## 2018-06-20 DIAGNOSIS — M79604 Pain in right leg: Secondary | ICD-10-CM | POA: Insufficient documentation

## 2018-06-20 DIAGNOSIS — I780 Hereditary hemorrhagic telangiectasia: Secondary | ICD-10-CM

## 2018-06-20 DIAGNOSIS — K219 Gastro-esophageal reflux disease without esophagitis: Secondary | ICD-10-CM | POA: Insufficient documentation

## 2018-06-20 DIAGNOSIS — Z5112 Encounter for antineoplastic immunotherapy: Secondary | ICD-10-CM | POA: Diagnosis not present

## 2018-06-20 DIAGNOSIS — K922 Gastrointestinal hemorrhage, unspecified: Secondary | ICD-10-CM | POA: Insufficient documentation

## 2018-06-20 DIAGNOSIS — R413 Other amnesia: Secondary | ICD-10-CM | POA: Diagnosis not present

## 2018-06-20 DIAGNOSIS — I130 Hypertensive heart and chronic kidney disease with heart failure and stage 1 through stage 4 chronic kidney disease, or unspecified chronic kidney disease: Secondary | ICD-10-CM | POA: Insufficient documentation

## 2018-06-20 DIAGNOSIS — M542 Cervicalgia: Secondary | ICD-10-CM

## 2018-06-20 DIAGNOSIS — Z794 Long term (current) use of insulin: Secondary | ICD-10-CM | POA: Diagnosis not present

## 2018-06-20 DIAGNOSIS — E1122 Type 2 diabetes mellitus with diabetic chronic kidney disease: Secondary | ICD-10-CM

## 2018-06-20 DIAGNOSIS — R5383 Other fatigue: Secondary | ICD-10-CM

## 2018-06-20 DIAGNOSIS — R630 Anorexia: Secondary | ICD-10-CM | POA: Insufficient documentation

## 2018-06-20 DIAGNOSIS — D500 Iron deficiency anemia secondary to blood loss (chronic): Secondary | ICD-10-CM

## 2018-06-20 DIAGNOSIS — Z95828 Presence of other vascular implants and grafts: Secondary | ICD-10-CM

## 2018-06-20 DIAGNOSIS — K31819 Angiodysplasia of stomach and duodenum without bleeding: Secondary | ICD-10-CM

## 2018-06-20 LAB — CBC WITH DIFFERENTIAL (CANCER CENTER ONLY)
Abs Immature Granulocytes: 0 10*3/uL (ref 0.00–0.07)
Basophils Absolute: 0 10*3/uL (ref 0.0–0.1)
Basophils Relative: 1 %
Eosinophils Absolute: 0.2 10*3/uL (ref 0.0–0.5)
Eosinophils Relative: 5 %
HCT: 37.1 % (ref 36.0–46.0)
Hemoglobin: 12.1 g/dL (ref 12.0–15.0)
Immature Granulocytes: 0 %
Lymphocytes Relative: 11 %
Lymphs Abs: 0.5 10*3/uL — ABNORMAL LOW (ref 0.7–4.0)
MCH: 31.3 pg (ref 26.0–34.0)
MCHC: 32.6 g/dL (ref 30.0–36.0)
MCV: 95.9 fL (ref 80.0–100.0)
Monocytes Absolute: 0.4 10*3/uL (ref 0.1–1.0)
Monocytes Relative: 10 %
Neutro Abs: 3.3 10*3/uL (ref 1.7–7.7)
Neutrophils Relative %: 73 %
Platelet Count: 155 10*3/uL (ref 150–400)
RBC: 3.87 MIL/uL (ref 3.87–5.11)
RDW: 13.5 % (ref 11.5–15.5)
WBC Count: 4.5 10*3/uL (ref 4.0–10.5)
nRBC: 0 % (ref 0.0–0.2)

## 2018-06-20 LAB — CMP (CANCER CENTER ONLY)
ALT: 23 U/L (ref 0–44)
AST: 22 U/L (ref 15–41)
Albumin: 3.6 g/dL (ref 3.5–5.0)
Alkaline Phosphatase: 119 U/L (ref 38–126)
Anion gap: 11 (ref 5–15)
BUN: 24 mg/dL — ABNORMAL HIGH (ref 8–23)
CO2: 24 mmol/L (ref 22–32)
Calcium: 10.3 mg/dL (ref 8.9–10.3)
Chloride: 108 mmol/L (ref 98–111)
Creatinine: 1.57 mg/dL — ABNORMAL HIGH (ref 0.44–1.00)
GFR, Est AFR Am: 36 mL/min — ABNORMAL LOW (ref >60)
GFR, Estimated: 31 mL/min — ABNORMAL LOW (ref >60)
Glucose, Bld: 343 mg/dL — ABNORMAL HIGH (ref 70–99)
Potassium: 4.4 mmol/L (ref 3.5–5.1)
Sodium: 143 mmol/L (ref 135–145)
Total Bilirubin: 0.4 mg/dL (ref 0.3–1.2)
Total Protein: 7 g/dL (ref 6.5–8.1)

## 2018-06-20 LAB — FERRITIN: Ferritin: 609 ng/mL — ABNORMAL HIGH (ref 11–307)

## 2018-06-20 LAB — SAMPLE TO BLOOD BANK

## 2018-06-20 MED ORDER — SODIUM CHLORIDE 0.9 % IV SOLN
4.5000 mg/kg | Freq: Once | INTRAVENOUS | Status: AC
  Administered 2018-06-20: 12:00:00 300 mg via INTRAVENOUS
  Filled 2018-06-20: qty 12

## 2018-06-20 MED ORDER — SODIUM CHLORIDE 0.9% FLUSH
10.0000 mL | INTRAVENOUS | Status: DC | PRN
  Administered 2018-06-20: 13:00:00 10 mL
  Filled 2018-06-20: qty 10

## 2018-06-20 MED ORDER — SODIUM CHLORIDE 0.9% FLUSH
10.0000 mL | INTRAVENOUS | Status: DC | PRN
  Administered 2018-06-20: 10 mL
  Filled 2018-06-20: qty 10

## 2018-06-20 MED ORDER — SODIUM CHLORIDE 0.9 % IV SOLN
Freq: Once | INTRAVENOUS | Status: AC
  Administered 2018-06-20: 11:00:00 via INTRAVENOUS
  Filled 2018-06-20: qty 250

## 2018-06-20 MED ORDER — HEPARIN SOD (PORK) LOCK FLUSH 100 UNIT/ML IV SOLN
500.0000 [IU] | Freq: Once | INTRAVENOUS | Status: AC | PRN
  Administered 2018-06-20: 13:00:00 500 [IU]
  Filled 2018-06-20: qty 5

## 2018-06-20 NOTE — Telephone Encounter (Signed)
Scheduled appt per 5/11 los.

## 2018-06-20 NOTE — Patient Instructions (Signed)
   [place]  Today you received the following: Avastin   If you develop nausea and vomiting that is not controlled by your nausea medication, call the clinic.   BELOW ARE SYMPTOMS THAT SHOULD BE REPORTED IMMEDIATELY:  *FEVER GREATER THAN 100.5 F  *CHILLS WITH OR WITHOUT FEVER  NAUSEA AND VOMITING THAT IS NOT CONTROLLED WITH YOUR NAUSEA MEDICATION  *UNUSUAL SHORTNESS OF BREATH  *UNUSUAL BRUISING OR BLEEDING  TENDERNESS IN MOUTH AND THROAT WITH OR WITHOUT PRESENCE OF ULCERS  *URINARY PROBLEMS  *BOWEL PROBLEMS  UNUSUAL RASH Items with * indicate a potential emergency and should be followed up as soon as possible.  Feel free to call the clinic should you have any questions or concerns. The clinic phone number is [phone].  Please show the [facility] at check-in to the Emergency Department and triage nurse.

## 2018-06-20 NOTE — Progress Notes (Signed)
Per Dr. Burr Medico, no iron transfusion today.   Pt unable to provide urine sample today. Per Dr. Burr Medico, ok to proceed with treatment.  Will continue to try to obtain urine sample.

## 2018-06-22 ENCOUNTER — Telehealth: Payer: Self-pay | Admitting: Pharmacy Technician

## 2018-06-22 ENCOUNTER — Telehealth: Payer: Self-pay | Admitting: Internal Medicine

## 2018-06-22 NOTE — Telephone Encounter (Signed)
Pt had a very bad nose bleed yesterday morning and this morning, pls contact [phone]

## 2018-06-22 NOTE — Telephone Encounter (Signed)
Called and lm for rtc from dr Ernestina Penna nurse

## 2018-06-22 NOTE — Telephone Encounter (Signed)
Diabetes Management Follow Up  [name] is a 78 y.o. female who was contacted for DM management. Identity was verified using date of birth and address.  Diabetes medications: Tresiba 21 units daily, Trulicity 1.5mg  every Tuesday - no missed doses in the past week - injection technique and medication storage reviewed with patient and both were appropriate  Hypoglycemia symptoms: none, some home BG < 70 mg/dL (lowest 60) Hyperglycemia symptoms: lethargy (per caregiver, patient herself cannot tell when she is hyperglycemic), some home BG > 300 mg/dL (highest 400)  Reviewed appropriate home blood glucose monitoring: Patient reports checking her blood glucose multiple times a day and always has at least one check before breakfast. Fasting blood glucose up to 300 mg/dL in the past week.  Diet and exercise were discussed with patient: - No significant sources of excess sugar/carbohydrates identified - Diet mostly meat, salads, sandwiches on honey wheat bread, peach or blueberries for dessert - Drink coffee with creamer and water with sugar free water flavoring, no soda or fruit juice. - Patient able to do minimal exercise but is trying to be more active, admits dfficult to do at home (plans to join a gym were abandoned due to restrictions from [place] closing all the gyms) but has support from caregivers  A/P Blood glucose control: poor   Interventions today: - No medication changes today given multiple hypoglycemic episodes even though FBG >300 some days - Advised patient to take insulin at the same time every day - Advised patient and patient's caregiver to check BG once in the morning before breakfast, once at midday 1-2 hours after a meal, and once at bedtime. Unable to check more than 3-4 times a day due to insurance quantity restrictions on test strips. - Patient and caregiver counseled on choosing foods with more fiber, checking nutrition facts and food ingredient labels to find hidden  sugars, and avoiding "natural" sugars like honey - Consider initiating low dose SGLT2 inhibitor (like Jardiance 5mg ) at next appointment if unable to pinpoint pattern in high and low BG readings and renal function stable  Advised to contact clinic or seek medical attention if concerns or symptoms arise. The patient and caregiver verbalized understanding of information provided by repeating back concepts discussed.  Follow-up Will f/u in 2 weeks to evaluate BG log and determine if there is a pattern to high and low BG readings   Brendolyn Patty, PharmD PGY1 Pharmacy Resident Phone [phone]  06/22/2018   1:41 PM

## 2018-06-22 NOTE — Telephone Encounter (Signed)
Return pt's call - no answer; left message to give a call back.

## 2018-06-22 NOTE — Telephone Encounter (Signed)
Pt is having frequent nosebleeds, please call and speak w/ her and caretaker asap.

## 2018-06-22 NOTE — Telephone Encounter (Signed)
This is due to her known Hereditary Hemorrhagic telangiectasias. She is now being followed by Dr Burr Medico at [facility]. She may benefit from ENT referral for cautery of AVMs. She could try Lysteda (Tranexamic Acid) 650 mg tabs, 2 tabs twice daily for 10 days to see if that helps bleeding slow down but I would have her check with Dr Burr Medico first.

## 2018-06-22 NOTE — Telephone Encounter (Signed)
I call pt back and spoke with pt and her sister-in-law, her epistaxis lasted about 10-15 mins last night, no recurrent bleeding today. She knows to apply pressure on her nose when it bleeds. We discussed ENT referral if she has frequent and prolonged nose bleeding episodes. She void good understanding and appreciated the call back.   Truitt Merle MD

## 2018-06-27 ENCOUNTER — Telehealth: Payer: Self-pay

## 2018-06-27 ENCOUNTER — Other Ambulatory Visit: Payer: Self-pay | Admitting: Hematology

## 2018-06-27 DIAGNOSIS — I780 Hereditary hemorrhagic telangiectasia: Secondary | ICD-10-CM

## 2018-06-27 NOTE — Telephone Encounter (Signed)
Patient calls requesting a referral to ENT for nose bleeds to see if she can get a cauterization.  She would also like to get a referral to get her shoulder injected.  She is also requesting to be able to get a new blood glucose meter.    Her husband's number is [phone]

## 2018-06-27 NOTE — Telephone Encounter (Signed)
Spoke with patient and her husband regarding appointment we have scheduled for Wednesday 5/20 at 10:00 at Dr. Rosina Lowenstein office (ENT) for nose bleeds, informed her she will need to contact her PCP about getting her should injection and a new blood glucose meter.  She verbalized an understanding.

## 2018-06-30 ENCOUNTER — Other Ambulatory Visit: Payer: Self-pay | Admitting: Hematology

## 2018-07-05 ENCOUNTER — Other Ambulatory Visit: Payer: Self-pay

## 2018-07-05 ENCOUNTER — Inpatient Hospital Stay: Payer: Medicare Other

## 2018-07-05 VITALS — BP 122/46 | HR 62 | Temp 98.3°F | Resp 18

## 2018-07-05 DIAGNOSIS — I780 Hereditary hemorrhagic telangiectasia: Secondary | ICD-10-CM | POA: Diagnosis not present

## 2018-07-05 DIAGNOSIS — Z95828 Presence of other vascular implants and grafts: Secondary | ICD-10-CM

## 2018-07-05 DIAGNOSIS — D500 Iron deficiency anemia secondary to blood loss (chronic): Secondary | ICD-10-CM

## 2018-07-05 DIAGNOSIS — K31819 Angiodysplasia of stomach and duodenum without bleeding: Secondary | ICD-10-CM

## 2018-07-05 LAB — CBC WITH DIFFERENTIAL (CANCER CENTER ONLY)
Abs Immature Granulocytes: 0.03 10*3/uL (ref 0.00–0.07)
Basophils Absolute: 0 10*3/uL (ref 0.0–0.1)
Basophils Relative: 1 %
Eosinophils Absolute: 0.4 10*3/uL (ref 0.0–0.5)
Eosinophils Relative: 7 %
HCT: 31.3 % — ABNORMAL LOW (ref 36.0–46.0)
Hemoglobin: 10.3 g/dL — ABNORMAL LOW (ref 12.0–15.0)
Immature Granulocytes: 1 %
Lymphocytes Relative: 9 %
Lymphs Abs: 0.5 10*3/uL — ABNORMAL LOW (ref 0.7–4.0)
MCH: 31.9 pg (ref 26.0–34.0)
MCHC: 32.9 g/dL (ref 30.0–36.0)
MCV: 96.9 fL (ref 80.0–100.0)
Monocytes Absolute: 0.6 10*3/uL (ref 0.1–1.0)
Monocytes Relative: 10 %
Neutro Abs: 3.9 10*3/uL (ref 1.7–7.7)
Neutrophils Relative %: 72 %
Platelet Count: 149 10*3/uL — ABNORMAL LOW (ref 150–400)
RBC: 3.23 MIL/uL — ABNORMAL LOW (ref 3.87–5.11)
RDW: 13.6 % (ref 11.5–15.5)
WBC Count: 5.4 10*3/uL (ref 4.0–10.5)
nRBC: 0 % (ref 0.0–0.2)

## 2018-07-05 LAB — TOTAL PROTEIN, URINE DIPSTICK: Protein, ur: NEGATIVE mg/dL

## 2018-07-05 MED ORDER — SODIUM CHLORIDE 0.9 % IV SOLN
4.5000 mg/kg | Freq: Once | INTRAVENOUS | Status: AC
  Administered 2018-07-05: 12:00:00 300 mg via INTRAVENOUS
  Filled 2018-07-05: qty 12

## 2018-07-05 MED ORDER — SODIUM CHLORIDE 0.9% FLUSH
10.0000 mL | INTRAVENOUS | Status: DC | PRN
  Administered 2018-07-05: 13:00:00 10 mL
  Filled 2018-07-05: qty 10

## 2018-07-05 MED ORDER — SODIUM CHLORIDE 0.9 % IV SOLN
Freq: Once | INTRAVENOUS | Status: AC
  Administered 2018-07-05: 11:00:00 via INTRAVENOUS
  Filled 2018-07-05: qty 250

## 2018-07-05 MED ORDER — HEPARIN SOD (PORK) LOCK FLUSH 100 UNIT/ML IV SOLN
500.0000 [IU] | Freq: Once | INTRAVENOUS | Status: AC | PRN
  Administered 2018-07-05: 13:00:00 500 [IU]
  Filled 2018-07-05: qty 5

## 2018-07-05 MED ORDER — SODIUM CHLORIDE 0.9% FLUSH
10.0000 mL | INTRAVENOUS | Status: DC | PRN
  Administered 2018-07-05: 10 mL
  Filled 2018-07-05: qty 10

## 2018-07-05 NOTE — Progress Notes (Signed)
Fereheme held today Ferritin 609

## 2018-07-06 ENCOUNTER — Telehealth: Payer: Self-pay | Admitting: Pharmacy Technician

## 2018-07-08 ENCOUNTER — Encounter: Payer: Self-pay | Admitting: Podiatry

## 2018-07-08 ENCOUNTER — Other Ambulatory Visit: Payer: Self-pay

## 2018-07-08 ENCOUNTER — Ambulatory Visit (INDEPENDENT_AMBULATORY_CARE_PROVIDER_SITE_OTHER): Payer: Medicare Other | Admitting: Podiatry

## 2018-07-08 DIAGNOSIS — B351 Tinea unguium: Secondary | ICD-10-CM

## 2018-07-08 DIAGNOSIS — L84 Corns and callosities: Secondary | ICD-10-CM

## 2018-07-08 DIAGNOSIS — M79674 Pain in right toe(s): Secondary | ICD-10-CM

## 2018-07-08 DIAGNOSIS — E0822 Diabetes mellitus due to underlying condition with diabetic chronic kidney disease: Secondary | ICD-10-CM

## 2018-07-08 DIAGNOSIS — N182 Chronic kidney disease, stage 2 (mild): Secondary | ICD-10-CM

## 2018-07-08 DIAGNOSIS — M79675 Pain in left toe(s): Secondary | ICD-10-CM

## 2018-07-08 DIAGNOSIS — Z794 Long term (current) use of insulin: Secondary | ICD-10-CM | POA: Diagnosis not present

## 2018-07-08 NOTE — Patient Instructions (Signed)
Diabetes Mellitus and Foot Care Foot care is an important part of your health, especially when you have diabetes. Diabetes may cause you to have problems because of poor blood flow (circulation) to your feet and legs, which can cause your skin to:  Become thinner and drier.  Break more easily.  Heal more slowly.  Peel and crack. You may also have nerve damage (neuropathy) in your legs and feet, causing decreased feeling in them. This means that you may not notice minor injuries to your feet that could lead to more serious problems. Noticing and addressing any potential problems early is the best way to prevent future foot problems. How to care for your feet Foot hygiene  Wash your feet daily with warm water and mild soap. Do not use hot water. Then, pat your feet and the areas between your toes until they are completely dry. Do not soak your feet as this can dry your skin.  Trim your toenails straight across. Do not dig under them or around the cuticle. File the edges of your nails with an emery board or nail file.  Apply a moisturizing lotion or petroleum jelly to the skin on your feet and to dry, brittle toenails. Use lotion that does not contain alcohol and is unscented. Do not apply lotion between your toes. Shoes and socks  Wear clean socks or stockings every day. Make sure they are not too tight. Do not wear knee-high stockings since they may decrease blood flow to your legs.  Wear shoes that fit properly and have enough cushioning. Always look in your shoes before you put them on to be sure there are no objects inside.  To break in new shoes, wear them for just a few hours a day. This prevents injuries on your feet. Wounds, scrapes, corns, and calluses  Check your feet daily for blisters, cuts, bruises, sores, and redness. If you cannot see the bottom of your feet, use a mirror or ask someone for help.  Do not cut corns or calluses or try to remove them with medicine.  If you  find a minor scrape, cut, or break in the skin on your feet, keep it and the skin around it clean and dry. You may clean these areas with mild soap and water. Do not clean the area with peroxide, alcohol, or iodine.  If you have a wound, scrape, corn, or callus on your foot, look at it several times a day to make sure it is healing and not infected. Check for: ? Redness, swelling, or pain. ? Fluid or blood. ? Warmth. ? Pus or a bad smell. General instructions  Do not cross your legs. This may decrease blood flow to your feet.  Do not use heating pads or hot water bottles on your feet. They may burn your skin. If you have lost feeling in your feet or legs, you may not know this is happening until it is too late.  Protect your feet from hot and cold by wearing shoes, such as at the beach or on hot pavement.  Schedule a complete foot exam at least once a year (annually) or more often if you have foot problems. If you have foot problems, report any cuts, sores, or bruises to your health care provider immediately. Contact a health care provider if:  You have a medical condition that increases your risk of infection and you have any cuts, sores, or bruises on your feet.  You have an injury that is not   healing.  You have redness on your legs or feet.  You feel burning or tingling in your legs or feet.  You have pain or cramps in your legs and feet.  Your legs or feet are numb.  Your feet always feel cold.  You have pain around a toenail. Get help right away if:  You have a wound, scrape, corn, or callus on your foot and: ? You have pain, swelling, or redness that gets worse. ? You have fluid or blood coming from the wound, scrape, corn, or callus. ? Your wound, scrape, corn, or callus feels warm to the touch. ? You have pus or a bad smell coming from the wound, scrape, corn, or callus. ? You have a fever. ? You have a red line going up your leg. Summary  Check your feet every day  for cuts, sores, red spots, swelling, and blisters.  Moisturize feet and legs daily.  Wear shoes that fit properly and have enough cushioning.  If you have foot problems, report any cuts, sores, or bruises to your health care provider immediately.  Schedule a complete foot exam at least once a year (annually) or more often if you have foot problems. This information is not intended to replace advice given to you by your health care provider. Make sure you discuss any questions you have with your health care provider. Document Released: 01/24/2000 Document Revised: 03/10/2017 Document Reviewed: 02/28/2016 Elsevier Interactive Patient Education  2019 [employer]  Onychomycosis/Fungal Toenails  WHAT IS IT? An infection that lies within the keratin of your nail plate that is caused by a fungus.  WHY ME? Fungal infections affect all ages, sexes, races, and creeds.  There may be many factors that predispose you to a fungal infection such as age, coexisting medical conditions such as diabetes, or an autoimmune disease; stress, medications, fatigue, genetics, etc.  Bottom line: fungus thrives in a warm, moist environment and your shoes offer such a location.  IS IT CONTAGIOUS? Theoretically, yes.  You do not want to share shoes, nail clippers or files with someone who has fungal toenails.  Walking around barefoot in the same room or sleeping in the same bed is unlikely to transfer the organism.  It is important to realize, however, that fungus can spread easily from one nail to the next on the same foot.  HOW DO WE TREAT THIS?  There are several ways to treat this condition.  Treatment may depend on many factors such as age, medications, pregnancy, liver and kidney conditions, etc.  It is best to ask your doctor which options are available to you.  1. No treatment.   Unlike many other medical concerns, you can live with this condition.  However for many people this can be a painful condition and  may lead to ingrown toenails or a bacterial infection.  It is recommended that you keep the nails cut short to help reduce the amount of fungal nail. 2. Topical treatment.  These range from herbal remedies to prescription strength nail lacquers.  About 40-50% effective, topicals require twice daily application for approximately 9 to 12 months or until an entirely new nail has grown out.  The most effective topicals are medical grade medications available through physicians offices. 3. Oral antifungal medications.  With an 80-90% cure rate, the most common oral medication requires 3 to 4 months of therapy and stays in your system for a year as the new nail grows out.  Oral antifungal medications do require   blood work to make sure it is a safe drug for you.  A liver function panel will be performed prior to starting the medication and after the first month of treatment.  It is important to have the blood work performed to avoid any harmful side effects.  In general, this medication safe but blood work is required. 4. Laser Therapy.  This treatment is performed by applying a specialized laser to the affected nail plate.  This therapy is noninvasive, fast, and non-painful.  It is not covered by insurance and is therefore, out of pocket.  The results have been very good with a 80-95% cure rate.  [facility] is the only practice in the area to offer this therapy. 5. Permanent Nail Avulsion.  Removing the entire nail so that a new nail will not grow back. 

## 2018-07-10 ENCOUNTER — Encounter: Payer: Self-pay | Admitting: Podiatry

## 2018-07-10 NOTE — Progress Notes (Signed)
Subjective: [name] is a 78 y.o. y.o. female who presents for preventative diabetic foot care on today with cc of painful, discolored, thick toenails and corn right 5th digit which interferes with daily activities. Pain is aggravated when wearing enclosed shoe gear and relieved with periodic professional debridement.  Modena Nunnery D, DO is her PCP.    Current Outpatient Medications:  .  augmented betamethasone dipropionate (DIPROLENE-AF) 0.05 % cream, Apply 1 application topically daily., Disp: , Rfl: 2 .  b complex vitamins capsule, Take 1 capsule by mouth daily., Disp: 100 capsule, Rfl: 2 .  calcium citrate-vitamin D (CITRACAL+D) 315-200 MG-UNIT tablet, Take 1 tablet by mouth 2 (two) times daily., Disp: 100 tablet, Rfl: 0 .  cetaphil (CETAPHIL) lotion, Apply 1 application topically as needed for dry skin., Disp: 236 mL, Rfl: 0 .  donepezil (ARICEPT) 10 MG tablet, Take 1 tablet (10 mg total) by mouth at bedtime., Disp: 90 tablet, Rfl: 4 .  Dulaglutide (TRULICITY) 1.5 OB/0.9GG SOPN, Inject 1.5 mg into the skin once a week. (Patient taking differently: Inject 1.5 mg into the skin once a week. Every Tuesday), Disp: 4 pen, Rfl: 3 .  Elastic Bandages & Supports ([place]) MISC, Wear as much as possible while awake to reduce swelling, Disp: 2 each, Rfl: 0 .  FERROCITE 324 MG TABS tablet, TAKE 1 TABLET BY MOUTH TWICE DAILY, Disp: 180 tablet, Rfl: 3 .  furosemide (LASIX) 20 MG tablet, Take 1 tablet (20 mg total) by mouth daily., Disp: 90 tablet, Rfl: 3 .  glucose blood (ONETOUCH VERIO) test strip, Use as instructed 4 times daily. E11.29, insulin requiring, Disp: 400 each, Rfl: 3 .  insulin degludec (TRESIBA FLEXTOUCH) 100 UNIT/ML SOPN FlexTouch Pen, Inject 0.2 mLs (20 Units total) into the skin every morning. (Patient taking differently: Inject 21 Units into the skin every evening. ), Disp: 6 pen, Rfl: 3 .  Insulin Pen Needle (B-D UF III MINI PEN NEEDLES) 31G X 5 MM MISC,  USE TO INJECT INSULIN 3 TIMES DAILY, Disp: 100 each, Rfl: 1 .  lactulose (CHRONULAC) 10 GM/15ML solution, TAKE 15 ML BY MOUTH TWO TIMES DAILY, Disp: 473 mL, Rfl: 2 .  levETIRAcetam (KEPPRA) 500 MG tablet, Take 1 tablet (500 mg total) by mouth 2 (two) times daily., Disp: 180 tablet, Rfl: 4 .  memantine (NAMENDA) 10 MG tablet, Take 1 tablet (10 mg total) by mouth 2 (two) times daily., Disp: 180 tablet, Rfl: 4 .  ONETOUCH DELICA LANCETS FINE MISC, Check blood sugar up to 4 times a day, Disp: 300 each, Rfl: 4 .  oxycodone-acetaminophen (PERCOCET) 2.5-325 MG tablet, Take 1-2 tablets by mouth at bedtime as needed for pain., Disp: 30 tablet, Rfl: 0 .  oxymetazoline (AFRIN) 0.05 % nasal spray, Place 1 spray into both nostrils 2 (two) times daily as needed (Epistaxis)., Disp: 30 mL, Rfl: 0 .  pantoprazole (PROTONIX) 40 MG tablet, TAKE 1 TABLET BY MOUTH ONCE DAILY, Disp: 180 tablet, Rfl: 0 .  potassium chloride (K-DUR) 10 MEQ tablet, Take 10 mEq by mouth daily., Disp: , Rfl:  .  potassium chloride (K-DUR,KLOR-CON) 10 MEQ tablet, TAKE 1 TABLET BY MOUTH ONCE DAILY, Disp: 90 tablet, Rfl: 1 .  sodium chloride (OCEAN) 0.65 % SOLN nasal spray, Place 1 spray into both nostrils as needed for congestion., Disp: 1 Bottle, Rfl: 5 .  traMADol (ULTRAM) 50 MG tablet, Take 1 tablet (50 mg total) by mouth every 12 (twelve) hours as needed for moderate pain., Disp: 14  tablet, Rfl: 0 .  triamcinolone ointment (KENALOG) 0.5 %, APPLY 1 APPLICATION TOPICALLY TO RASH TWICE DAILY FOR ITCHING, Disp: 15 g, Rfl: 1  Allergies  Allergen Reactions  . Aspirin Nausea And Vomiting    Objective: There were no vitals filed for this visit.  Vascular Examination: Capillary refill time immediate x 10 digits.  Dorsalis pedis pulses palpable b/l.  Posterior tibial pulses palpable b/l.  Digital hair absent x 10 digits.  Skin temperature gradient WNL b/l.  Dermatological Examination: Skin with normal turgor, texture and tone  b/l.  Toenails 1-5 b/l discolored, thick, dystrophic with subungual debris and pain with palpation to nailbeds due to thickness of nails.  Hyperkeratotic lesion dorsal 5th digit PIPJ right foot. No erythema, no edema, no drainage, no flocculence noted.   Musculoskeletal: Muscle strength 5/5 to all LE muscle groups.  HAV with bunion b/l  Hammertoe right 5th digit  Neurological: Sensation intact with 10 gram monofilament.  Vibratory sensation intact.  Assessment: 1. Painful onychomycosis toenails 1-5 b/l 2. Corn right 5th digit 4.  NIDDM  Plan: 1. Continue diabetic foot care principles. Literature dispensed on today. 2. Toenails 1-5 b/l were debrided in length and girth without iatrogenic bleeding. 3. Hyperkeratotic lesion(s) pared right 5th digit with sterile scalpel blade without incident. 4. Patient to continue soft, supportive shoe gear daily. 5. Patient to report any pedal injuries to medical professional immediately. 6. Follow up 3 months.  7. Patient/POA to call should there be a concern in the interim.

## 2018-07-15 NOTE — Progress Notes (Signed)
Patient was contacted for DM, average BG at home reported in the 190s, although difficult to assess over the phone. Patient read results from home BG meter but skipped a few days this past week. She states she checks BG daily.  No BG results > 300 mg/dL or < 70 this past week, no med changes recommended at this time. Ave BG correlates with A1C around 8-8.5%. Patient has PCP appointment 6/29 and I will try to schedule co-visit with patient at that time due to difficulty with phone DM management. Will also plan to offer clinic CGM at that visit.

## 2018-07-18 ENCOUNTER — Encounter: Payer: Self-pay | Admitting: Nurse Practitioner

## 2018-07-18 ENCOUNTER — Other Ambulatory Visit: Payer: Self-pay | Admitting: Hematology

## 2018-07-18 ENCOUNTER — Inpatient Hospital Stay: Payer: Medicare Other

## 2018-07-18 ENCOUNTER — Inpatient Hospital Stay (HOSPITAL_BASED_OUTPATIENT_CLINIC_OR_DEPARTMENT_OTHER): Payer: Medicare Other | Admitting: Nurse Practitioner

## 2018-07-18 ENCOUNTER — Inpatient Hospital Stay: Payer: Medicare Other | Attending: Hematology

## 2018-07-18 ENCOUNTER — Other Ambulatory Visit: Payer: Self-pay

## 2018-07-18 VITALS — BP 126/47

## 2018-07-18 VITALS — BP 169/69 | HR 60 | Temp 98.3°F | Resp 16 | Ht 63.0 in | Wt 144.7 lb

## 2018-07-18 DIAGNOSIS — I780 Hereditary hemorrhagic telangiectasia: Secondary | ICD-10-CM

## 2018-07-18 DIAGNOSIS — E1122 Type 2 diabetes mellitus with diabetic chronic kidney disease: Secondary | ICD-10-CM

## 2018-07-18 DIAGNOSIS — R040 Epistaxis: Secondary | ICD-10-CM

## 2018-07-18 DIAGNOSIS — Z79899 Other long term (current) drug therapy: Secondary | ICD-10-CM | POA: Insufficient documentation

## 2018-07-18 DIAGNOSIS — R630 Anorexia: Secondary | ICD-10-CM | POA: Insufficient documentation

## 2018-07-18 DIAGNOSIS — I5032 Chronic diastolic (congestive) heart failure: Secondary | ICD-10-CM | POA: Diagnosis not present

## 2018-07-18 DIAGNOSIS — E119 Type 2 diabetes mellitus without complications: Secondary | ICD-10-CM

## 2018-07-18 DIAGNOSIS — K219 Gastro-esophageal reflux disease without esophagitis: Secondary | ICD-10-CM | POA: Diagnosis not present

## 2018-07-18 DIAGNOSIS — M79604 Pain in right leg: Secondary | ICD-10-CM | POA: Diagnosis not present

## 2018-07-18 DIAGNOSIS — D500 Iron deficiency anemia secondary to blood loss (chronic): Secondary | ICD-10-CM

## 2018-07-18 DIAGNOSIS — R413 Other amnesia: Secondary | ICD-10-CM | POA: Insufficient documentation

## 2018-07-18 DIAGNOSIS — I130 Hypertensive heart and chronic kidney disease with heart failure and stage 1 through stage 4 chronic kidney disease, or unspecified chronic kidney disease: Secondary | ICD-10-CM | POA: Diagnosis not present

## 2018-07-18 DIAGNOSIS — M542 Cervicalgia: Secondary | ICD-10-CM | POA: Diagnosis not present

## 2018-07-18 DIAGNOSIS — K31819 Angiodysplasia of stomach and duodenum without bleeding: Secondary | ICD-10-CM

## 2018-07-18 DIAGNOSIS — E785 Hyperlipidemia, unspecified: Secondary | ICD-10-CM

## 2018-07-18 DIAGNOSIS — Z95828 Presence of other vascular implants and grafts: Secondary | ICD-10-CM

## 2018-07-18 DIAGNOSIS — Z5112 Encounter for antineoplastic immunotherapy: Secondary | ICD-10-CM | POA: Diagnosis not present

## 2018-07-18 DIAGNOSIS — Z794 Long term (current) use of insulin: Secondary | ICD-10-CM

## 2018-07-18 DIAGNOSIS — R5383 Other fatigue: Secondary | ICD-10-CM | POA: Insufficient documentation

## 2018-07-18 DIAGNOSIS — N183 Chronic kidney disease, stage 3 (moderate): Secondary | ICD-10-CM | POA: Diagnosis not present

## 2018-07-18 LAB — CMP (CANCER CENTER ONLY)
ALT: 32 U/L (ref 0–44)
AST: 37 U/L (ref 15–41)
Albumin: 3.5 g/dL (ref 3.5–5.0)
Alkaline Phosphatase: 116 U/L (ref 38–126)
Anion gap: 12 (ref 5–15)
BUN: 24 mg/dL — ABNORMAL HIGH (ref 8–23)
CO2: 24 mmol/L (ref 22–32)
Calcium: 9.8 mg/dL (ref 8.9–10.3)
Chloride: 106 mmol/L (ref 98–111)
Creatinine: 1.62 mg/dL — ABNORMAL HIGH (ref 0.44–1.00)
GFR, Est AFR Am: 35 mL/min — ABNORMAL LOW (ref >60)
GFR, Estimated: 30 mL/min — ABNORMAL LOW (ref >60)
Glucose, Bld: 294 mg/dL — ABNORMAL HIGH (ref 70–99)
Potassium: 4.4 mmol/L (ref 3.5–5.1)
Sodium: 142 mmol/L (ref 135–145)
Total Bilirubin: 0.4 mg/dL (ref 0.3–1.2)
Total Protein: 6.6 g/dL (ref 6.5–8.1)

## 2018-07-18 LAB — CBC WITH DIFFERENTIAL (CANCER CENTER ONLY)
Abs Immature Granulocytes: 0.01 10*3/uL (ref 0.00–0.07)
Basophils Absolute: 0 10*3/uL (ref 0.0–0.1)
Basophils Relative: 1 %
Eosinophils Absolute: 0.2 10*3/uL (ref 0.0–0.5)
Eosinophils Relative: 5 %
HCT: 36.3 % (ref 36.0–46.0)
Hemoglobin: 11.9 g/dL — ABNORMAL LOW (ref 12.0–15.0)
Immature Granulocytes: 0 %
Lymphocytes Relative: 10 %
Lymphs Abs: 0.5 10*3/uL — ABNORMAL LOW (ref 0.7–4.0)
MCH: 31.3 pg (ref 26.0–34.0)
MCHC: 32.8 g/dL (ref 30.0–36.0)
MCV: 95.5 fL (ref 80.0–100.0)
Monocytes Absolute: 0.5 10*3/uL (ref 0.1–1.0)
Monocytes Relative: 9 %
Neutro Abs: 4 10*3/uL (ref 1.7–7.7)
Neutrophils Relative %: 75 %
Platelet Count: 143 10*3/uL — ABNORMAL LOW (ref 150–400)
RBC: 3.8 MIL/uL — ABNORMAL LOW (ref 3.87–5.11)
RDW: 13.1 % (ref 11.5–15.5)
WBC Count: 5.2 10*3/uL (ref 4.0–10.5)
nRBC: 0 % (ref 0.0–0.2)

## 2018-07-18 LAB — FERRITIN: Ferritin: 106 ng/mL (ref 11–307)

## 2018-07-18 LAB — TOTAL PROTEIN, URINE DIPSTICK: Protein, ur: NEGATIVE mg/dL

## 2018-07-18 MED ORDER — SODIUM CHLORIDE 0.9 % IV SOLN
Freq: Once | INTRAVENOUS | Status: AC
  Administered 2018-07-18: 11:00:00 via INTRAVENOUS
  Filled 2018-07-18: qty 250

## 2018-07-18 MED ORDER — SODIUM CHLORIDE 0.9% FLUSH
10.0000 mL | INTRAVENOUS | Status: DC | PRN
  Administered 2018-07-18: 10 mL
  Filled 2018-07-18: qty 10

## 2018-07-18 MED ORDER — SODIUM CHLORIDE 0.9 % IV SOLN
4.5000 mg/kg | Freq: Once | INTRAVENOUS | Status: AC
  Administered 2018-07-18: 300 mg via INTRAVENOUS
  Filled 2018-07-18: qty 12

## 2018-07-18 MED ORDER — HEPARIN SOD (PORK) LOCK FLUSH 100 UNIT/ML IV SOLN
500.0000 [IU] | Freq: Once | INTRAVENOUS | Status: AC | PRN
  Administered 2018-07-18: 500 [IU]
  Filled 2018-07-18: qty 5

## 2018-07-18 MED ORDER — SODIUM CHLORIDE 0.9 % IV SOLN
INTRAVENOUS | Status: DC
  Administered 2018-07-18: 11:00:00 via INTRAVENOUS
  Filled 2018-07-18 (×2): qty 250

## 2018-07-18 MED ORDER — SODIUM CHLORIDE 0.9% FLUSH
10.0000 mL | INTRAVENOUS | Status: DC | PRN
  Administered 2018-07-18: 10:00:00 10 mL
  Filled 2018-07-18: qty 10

## 2018-07-18 MED ORDER — SODIUM CHLORIDE 0.9 % IV SOLN
Freq: Once | INTRAVENOUS | Status: DC
  Filled 2018-07-18: qty 250

## 2018-07-18 NOTE — Progress Notes (Signed)
[place]   [phone] [phone]) [phone]   Clinic Follow up Note   Patient Care Team: Delice Bison, DO as PCP - General [name], MD as PCP - Cardiology (Cardiology) 07/18/2018  CHIEF COMPLAINT: F/u anemia, [place]   Hematology history:  1.Hereditary hemorrhagic telangiectasia (HHT),with epistaxis andfrequentGI bleeding. Previously under care of Dr. [name] 2.Anemia secondary to GI bleeding and iron deficiency, required blood transfusion every 3 to 4 weeks, IV Feraheme every 1 to 2 months  CURRENT THERAPY:  1. Iv feraheme as needed if ferritin<50 2.Avastin5mg /kg every 2 weeks, starting3/30/20 3. Blood transfusion if Hg<8.0, (now <7 due to COVID-19 restrictions.)  INTERVAL HISTORY: [name] returns for follow up and treatment as scheduled. Last avastin on 5/26. Last received IV Feraheme on 06/06/18. She is feeling well. Bleeding is less frequent on Avastin, she has a mild nosebleed every "few" days. She applies "[place]" saline nasal mist to a small cotton ball and applies to nares while she is out of the house so she doesn't bleed. Stools are black, she is on oral iron; denies other bleeding. She has some leg burning. Her mobility is low, uses walker at home. Mild to moderate fatigue is at baseline. She goes to church, but practices social distancing and masking. She has memory loss. She did not want to call anyone on today's visit. Denies fever, chills, cough, chest pain, dyspnea, leg edema, or pain.    MEDICAL HISTORY:  Past Medical History:  Diagnosis Date   Chronic anemia    Chronic diastolic CHF (congestive heart failure) ([place]) 10/03/2013   Chronic GI bleeding    /notes 11/29/2014   Family history of anesthesia complication    "niece has a hard time coming out" (09/15/2012)   Frequent nosebleeds    chronic   Gastric AV malformation    /notes 11/29/2014   GERD (gastroesophageal reflux disease)    Heart murmur 04/01/2017     Moderate AVSC on echo 09/2016   History of blood transfusion "several"   History of epistaxis    HTN (hypertension), benign 03/02/2012   Hyperlipidemia    Iron deficiency anemia    chronic infusions"   [place]ichen planus    Both lower extremities   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    SURGICAL HISTORY: Past Surgical History:  Procedure Laterality Date   CATARACT EXTRACTION     "I think it was just one eye"   ESOPHAGOGASTRODUODENOSCOPY  02/26/2011   Procedure: ESOPHAGOGASTRODUODENOSCOPY (EGD);  Surgeon: [name], MD;  Location: [name];  Service: Endoscopy;  Laterality: N/A;   ESOPHAGOGASTRODUODENOSCOPY N/A 11/08/2012   Procedure: ESOPHAGOGASTRODUODENOSCOPY (EGD);  Surgeon: [name], MD;  Location: [name];  Service: Endoscopy;  Laterality: N/A;   ESOPHAGOGASTRODUODENOSCOPY N/A 10/04/2013   Procedure: ESOPHAGOGASTRODUODENOSCOPY (EGD);  Surgeon: Winfield Cunas., MD;  Location: [name];  Service: Endoscopy;  Laterality: N/A;  with APC on stand-by   ESOPHAGOGASTRODUODENOSCOPY N/A 07/06/2014   Procedure: ESOPHAGOGASTRODUODENOSCOPY (EGD);  Surgeon: Clarene Essex, MD;  Location: [name];  Service: Endoscopy;  Laterality: N/A;   ESOPHAGOGASTRODUODENOSCOPY N/A 09/05/2014   Procedure: ESOPHAGOGASTRODUODENOSCOPY (EGD);  Surgeon: Laurence Spates, MD;  Location:  [name];  Service: Endoscopy;  Laterality: N/A;  APC on standby to control bleeding   ESOPHAGOGASTRODUODENOSCOPY N/A 11/29/2014   Procedure: ESOPHAGOGASTRODUODENOSCOPY (EGD);  Surgeon: [name], MD;  Location: [facility]OSCOPY;  Service: Endoscopy;  Laterality: N/A;   ESOPHAGOGASTRODUODENOSCOPY N/A 09/28/2015    Procedure: ESOPHAGOGASTRODUODENOSCOPY (EGD);  Surgeon: Clarene Essex, MD;  Location: [facility] ENDOSCOPY;  Service: Endoscopy;  Laterality: N/A;   ESOPHAGOGASTRODUODENOSCOPY (EGD) WITH PROPOFOL N/A 12/04/2017   Procedure: ESOPHAGOGASTRODUODENOSCOPY (EGD) WITH PROPOFOL;  Surgeon: [name], MD;  Location: [place];  Service: Endoscopy;  Laterality: N/A;   ESOPHAGOGASTRODUODENOSCOPY ENDOSCOPY  08/19/2006   with laser treatment   HOT HEMOSTASIS N/A 07/06/2014   Procedure: HOT HEMOSTASIS (ARGON PLASMA COAGULATION/BICAP);  Surgeon: Clarene Essex, MD;  Location: [name];  Service: Endoscopy;  Laterality: N/A;   HOT HEMOSTASIS N/A 09/28/2015   Procedure: HOT HEMOSTASIS (ARGON PLASMA COAGULATION/BICAP);  Surgeon: Clarene Essex, MD;  Location: [facility] ENDOSCOPY;  Service: Endoscopy;  Laterality: N/A;   HOT HEMOSTASIS N/A 12/04/2017   Procedure: HOT HEMOSTASIS (ARGON PLASMA COAGULATION/BICAP);  Surgeon: [name], MD;  Location: [place];  Service: Endoscopy;  Laterality: N/A;   NASAL HEMORRHAGE CONTROL     "for bleeding"    SAVORY DILATION  02/26/2011   Procedure: SAVORY DILATION;  Surgeon: [name], MD;  Location: [name];  Service: Endoscopy;  Laterality: N/A;  c-arm needed   SUBMUCOSAL INJECTION  12/04/2017   Procedure: SUBMUCOSAL INJECTION;  Surgeon: [name], MD;  Location: [facility] ENDOSCOPY;  Service: Endoscopy;;    I have reviewed the social history and family history with the patient and they are unchanged from previous note.  ALLERGIES:  is allergic to aspirin.  MEDICATIONS:  Current Outpatient Medications  Medication Sig Dispense Refill   augmented betamethasone dipropionate (DIPROLENE-AF) 0.05 % cream Apply 1 application topically daily.  2   b complex vitamins capsule Take 1 capsule by mouth daily. 100 capsule 2   cetaphil (CETAPHIL) lotion Apply 1 application topically as needed for dry skin. 236 mL 0   donepezil (ARICEPT) 10 MG tablet Take 1 tablet (10  mg total) by mouth at bedtime. 90 tablet 4   Dulaglutide (TRULICITY) 1.5 YS/0.6TK SOPN Inject 1.5 mg into the skin once a week. (Patient taking differently: Inject 1.5 mg into the skin once a week. Every Tuesday) 4 pen 3   Elastic Bandages & Supports (MEDICAL COMPRESSION STOCKINGS) MISC Wear as much as possible while awake to reduce swelling 2 each 0   FERROCITE 324 MG TABS tablet TAKE 1 TABLET BY MOUTH TWICE DAILY 180 tablet 3   furosemide (LASIX) 20 MG tablet Take 1 tablet (20 mg total) by mouth daily. 90 tablet 3   glucose blood (ONETOUCH VERIO) test strip Use as instructed 4 times daily. E11.29, insulin requiring 400 each 3   insulin degludec (TRESIBA FLEXTOUCH) 100 UNIT/ML SOPN FlexTouch Pen Inject 0.2 mLs (20 Units total) into the skin every morning. (Patient taking differently: Inject 21 Units into the skin every evening. ) 6 pen 3   Insulin Pen Needle (B-D UF III MINI PEN NEEDLES) 31G X 5 MM MISC USE TO INJECT INSULIN 3 TIMES DAILY 100 each 1   lactulose (CHRONULAC) 10 GM/15ML solution TAKE 15 ML BY MOUTH TWO TIMES DAILY 473 mL 2   levETIRAcetam (KEPPRA) 500 MG tablet Take 1 tablet (500 mg total) by mouth 2 (two) times daily. 180 tablet 4   memantine (NAMENDA) 10 MG tablet Take 1 tablet (10 mg total) by mouth 2 (two)  times daily. 180 tablet 4   ONETOUCH DELICA LANCETS FINE MISC Check blood sugar up to 4 times a day 300 each 4   oxycodone-acetaminophen (PERCOCET) 2.5-325 MG tablet Take 1-2 tablets by mouth at bedtime as needed for pain. 30 tablet 0   oxymetazoline (AFRIN) 0.05 % nasal spray Place 1 spray into both nostrils 2 (two) times daily as needed (Epistaxis). 30 mL 0   pantoprazole (PROTONIX) 40 MG tablet TAKE 1 TABLET BY MOUTH ONCE DAILY 180 tablet 0   potassium chloride (K-DUR) 10 MEQ tablet Take 10 mEq by mouth daily.     potassium chloride (K-DUR,KLOR-CON) 10 MEQ tablet TAKE 1 TABLET BY MOUTH ONCE DAILY 90 tablet 1   sodium chloride (OCEAN) 0.65 % SOLN nasal spray  Place 1 spray into both nostrils as needed for congestion. 1 Bottle 5   traMADol (ULTRAM) 50 MG tablet Take 1 tablet (50 mg total) by mouth every 12 (twelve) hours as needed for moderate pain. 14 tablet 0   triamcinolone ointment (KENALOG) 0.5 % APPLY 1 APPLICATION TOPICALLY TO RASH TWICE DAILY FOR ITCHING 15 g 1   calcium citrate-vitamin D (CITRACAL+D) 315-200 MG-UNIT tablet Take 1 tablet by mouth 2 (two) times daily. 100 tablet 0   Current Facility-Administered Medications  Medication Dose Route Frequency Provider Last Rate Last Dose   0.9 %  sodium chloride infusion   Intravenous Once Alla Feeling, NP       Facility-Administered Medications Ordered in Other Visits  Medication Dose Route Frequency Provider Last Rate Last Dose   0.9 %  sodium chloride infusion   Intravenous Continuous Alla Feeling, NP 500 mL/hr at 07/18/18 1046     bevacizumab (AVASTIN) 300 mg in sodium chloride 0.9 % 100 mL chemo infusion  4.5 mg/kg (Treatment Plan Recorded) Intravenous Once Truitt Merle, MD       heparin lock flush 100 unit/mL  500 Units Intracatheter Once PRN Truitt Merle, MD       sodium chloride flush (NS) 0.9 % injection 10 mL  10 mL Intracatheter PRN Truitt Merle, MD        PHYSICAL EXAMINATION: ECOG PERFORMANCE STATUS: 2 - Symptomatic, <50% confined to bed  Vitals:   07/18/18 0956  BP: (!) 169/69  Pulse: 60  Resp: 16  Temp: 98.3 F (36.8 C)  SpO2: 100%   Filed Weights   07/18/18 0956  Weight: 144 lb 11.2 oz (65.6 kg)    GENERAL:alert, no distress and comfortable. Presents in wheelchair  SKIN: no obvious rash  EYES:  sclera clear LUNGS: respirations even and unlabored  HEART:  no lower extremity edema Musculoskeletal:no cyanosis of digits  NEURO: alert & oriented x 3 with fluent speech Limited exam for covid19 outbreak    LABORATORY DATA:  I have reviewed the data as listed CBC Latest Ref Rng & Units 07/18/2018 07/05/2018 06/20/2018  WBC 4.0 - 10.5 K/uL 5.2 5.4 4.5  Hemoglobin  12.0 - 15.0 g/dL 11.9(L) 10.3(L) 12.1  Hematocrit 36.0 - 46.0 % 36.3 31.3(L) 37.1  Platelets 150 - 400 K/uL 143(L) 149(L) 155     CMP Latest Ref Rng & Units 07/18/2018 06/20/2018 05/23/2018  Glucose 70 - 99 mg/dL 294(H) 343(H) 316(H)  BUN 8 - 23 mg/dL 24(H) 24(H) 19  Creatinine 0.44 - 1.00 mg/dL 1.62(H) 1.57(H) 1.44(H)  Sodium 135 - 145 mmol/L 142 143 141  Potassium 3.5 - 5.1 mmol/L 4.4 4.4 4.3  Chloride 98 - 111 mmol/L 106 108 104  CO2 22 - 32  mmol/L 24 24 27   Calcium 8.9 - 10.3 mg/dL 9.8 10.3 9.4  Total Protein 6.5 - 8.1 g/dL 6.6 7.0 6.3(L)  Total Bilirubin 0.3 - 1.2 mg/dL 0.4 0.4 0.3  Alkaline Phos 38 - 126 U/L 116 119 117  AST 15 - 41 U/L 37 22 27  ALT 0 - 44 U/L 32 23 22      RADIOGRAPHIC STUDIES: I have personally reviewed the radiological images as listed and agreed with the findings in the report. No results found.   ASSESSMENT & PLAN: [name] is a 78 y.o. female with   1.Hereditary hemorrhagic telangiectasia(HHT)), withepistaxis and frequent GI bleeding -she previously required frequent RBC transfusion due to epistaxis and recurrent GI bleeding. Last RBC 04/13/18.  -She began Avastin q2 weeks on 05/09/18, she is tolerating well. Epistaxis improved.  -Hgb 11.9 today, improved, she is responding well to therapy. CMP shows BG 294, Cr 1.62 which is increased, likely related to uncontrolled DM. Her BG range at home is 67-340, average 100-200. Urine protein negative.  -Ferritin is pending, last Feraheme 06/06/18 - continue IV Feraheme if ferritin <50  -I encouraged her to obtain tighter BG control, and educated her on risks of hypo/hyperglycemia in her age. She has f/u with PCP on 6/29, I reviewed this with her.  -SBP elevated today, 169/69; infusion RN to recheck. Related to co-morbidities and possibly avastin. OK to treat, will monitor closely.  -she will proceed with Avastin today 5 mg/kg; will change to q4 weeks from today. She will get additional 500 cc NS for elevated  BG and Cr. I encouraged her to drink more  -will repeat labs in 2 weeks -f/u and next avastin in 4 weeks  -I reviewed the plan with Dr. Burr Medico, pharmacy, and infusion RN  2. Fatigue and anorexia  -multifactorial -I encouraged her to remain active, use her walker, and eat small, frequent meals -weight is stable  3. 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 Right Leg pain and Neck Pain  -multifactorial, likely related to arthritis, or peripheral neuropathy from diabetes -takes tramadol; I do not intend to fill pain med prescriptions  -f/u with PCP  7. DM,CHF and CKD stage III -She is on insulin, DM not well controlled, range 67 (once)- 340 at home  -on lasix 20 mg daily  -she recently had phone check in with pharmacist.  -next PCP f/u on 6/29, I strongly encouraged her to go, perhaps may need sooner appt if she has frequent readings <70 or too high. She agrees.  -Cr. 1.62 today, BG 294 -she will receive 500 cc NS in addition to avastin today   PLAN: -Labs reviewed -Proceed with avastin today, change to q4 weeks from today -Lab in 2 weeks  -Call clinic sooner if bleeding increases  -F/u with PCP 6/29  -next lab, f/u, and Avastin in 4 weeks   All questions were answered. The patient knows to call the clinic with any problems, questions or concerns. No  barriers to learning was detected.     Alla Feeling, NP 07/18/18

## 2018-07-18 NOTE — Progress Notes (Signed)
Spoke w/ Regan Rakers, okay to treat w/ SBP 169 today. BP will be rechecked in infusion today. Patient see's PCP in near future and next avastin treatment is in a month. BP will continue to be monitored on a chronic basis.   Demetrius Charity, PharmD, [facility] Pharmacist Pharmacy Phone: [phone] 07/18/2018

## 2018-07-18 NOTE — Progress Notes (Signed)
Received verbal order from Cira Rue NP to administer 500 NS prior to avastin due to elevated creatinine.

## 2018-07-18 NOTE — Patient Instructions (Signed)
[facility] Discharge Instructions for Patients Receiving Chemotherapy  Today you received the following chemotherapy agents Avastin  To help prevent nausea and vomiting after your treatment, we encourage you to take your nausea medication as directed   If you develop nausea and vomiting that is not controlled by your nausea medication, call the clinic.   BELOW ARE SYMPTOMS THAT SHOULD BE REPORTED IMMEDIATELY:  *FEVER GREATER THAN 100.5 F  *CHILLS WITH OR WITHOUT FEVER  NAUSEA AND VOMITING THAT IS NOT CONTROLLED WITH YOUR NAUSEA MEDICATION  *UNUSUAL SHORTNESS OF BREATH  *UNUSUAL BRUISING OR BLEEDING  TENDERNESS IN MOUTH AND THROAT WITH OR WITHOUT PRESENCE OF ULCERS  *URINARY PROBLEMS  *BOWEL PROBLEMS  UNUSUAL RASH Items with * indicate a potential emergency and should be followed up as soon as possible.  Feel free to call the clinic should you have any questions or concerns. The clinic phone number is [phone].  Please show the CHEMO ALERT CARD at check-in to the Emergency Department and triage nurse.   

## 2018-07-18 NOTE — Patient Instructions (Signed)
During today's visit we discussed the following:  -Your bleeding has improved on Avastin therapy, hemoglobin up to 11.9. You are responding well.   -You will get infusions every 4 weeks after today.   -We will continue to check your labs frequently. Next lab check in 2 weeks. You can leave the cancer center after your lab appointment. We will contact you with the result. A scheduler will call to make these appointments.   -Your diabetes is not well controlled, and your blood pressure and kidney function are abnormal today. Your next PCP follow up is on 6/29, please adhere to this appointment. Drink more water, and eat small, frequent healthy diet. Try to be active at home.   -Practice social distancing and wear a mask when you leave your house.   -Call our clinic [phone] if you experience increased bleeding, fever, chills, new cough, shortness of breath, or other concerns.

## 2018-07-19 ENCOUNTER — Telehealth: Payer: Self-pay | Admitting: Nurse Practitioner

## 2018-07-19 ENCOUNTER — Telehealth: Payer: Self-pay | Admitting: *Deleted

## 2018-07-19 NOTE — Telephone Encounter (Signed)
-----   Message from Truitt Merle, MD sent at 07/19/2018 11:37 AM EDT ----- Please let pt know her iron level is good, no need iv iron for now, thanks   Truitt Merle  07/19/2018

## 2018-07-19 NOTE — Telephone Encounter (Signed)
Called & spoke with pt & husband & informed of good iron level & no need for IV iron for now. Husband expressed understanding.

## 2018-07-19 NOTE — Telephone Encounter (Signed)
Scheduled appt per 6/8 los. °

## 2018-07-20 ENCOUNTER — Other Ambulatory Visit: Payer: Self-pay | Admitting: Pharmacist

## 2018-07-20 DIAGNOSIS — E1165 Type 2 diabetes mellitus with hyperglycemia: Secondary | ICD-10-CM

## 2018-07-20 DIAGNOSIS — Z794 Long term (current) use of insulin: Secondary | ICD-10-CM

## 2018-07-27 LAB — HM DIABETES EYE EXAM

## 2018-08-01 ENCOUNTER — Inpatient Hospital Stay: Payer: Medicare Other

## 2018-08-01 ENCOUNTER — Other Ambulatory Visit: Payer: Self-pay | Admitting: Internal Medicine

## 2018-08-01 ENCOUNTER — Other Ambulatory Visit: Payer: Self-pay | Admitting: Cardiology

## 2018-08-01 ENCOUNTER — Other Ambulatory Visit: Payer: Self-pay

## 2018-08-01 DIAGNOSIS — I780 Hereditary hemorrhagic telangiectasia: Secondary | ICD-10-CM | POA: Diagnosis not present

## 2018-08-01 DIAGNOSIS — D500 Iron deficiency anemia secondary to blood loss (chronic): Secondary | ICD-10-CM

## 2018-08-01 LAB — CBC WITH DIFFERENTIAL (CANCER CENTER ONLY)
Abs Immature Granulocytes: 0.03 10*3/uL (ref 0.00–0.07)
Basophils Absolute: 0 10*3/uL (ref 0.0–0.1)
Basophils Relative: 0 %
Eosinophils Absolute: 0.1 10*3/uL (ref 0.0–0.5)
Eosinophils Relative: 2 %
HCT: 37.2 % (ref 36.0–46.0)
Hemoglobin: 12.4 g/dL (ref 12.0–15.0)
Immature Granulocytes: 0 %
Lymphocytes Relative: 5 %
Lymphs Abs: 0.4 10*3/uL — ABNORMAL LOW (ref 0.7–4.0)
MCH: 31.1 pg (ref 26.0–34.0)
MCHC: 33.3 g/dL (ref 30.0–36.0)
MCV: 93.2 fL (ref 80.0–100.0)
Monocytes Absolute: 0.6 10*3/uL (ref 0.1–1.0)
Monocytes Relative: 9 %
Neutro Abs: 6 10*3/uL (ref 1.7–7.7)
Neutrophils Relative %: 84 %
Platelet Count: 133 10*3/uL — ABNORMAL LOW (ref 150–400)
RBC: 3.99 MIL/uL (ref 3.87–5.11)
RDW: 12.4 % (ref 11.5–15.5)
WBC Count: 7.1 10*3/uL (ref 4.0–10.5)
nRBC: 0 % (ref 0.0–0.2)

## 2018-08-06 ENCOUNTER — Other Ambulatory Visit: Payer: Self-pay | Admitting: Internal Medicine

## 2018-08-06 DIAGNOSIS — E1121 Type 2 diabetes mellitus with diabetic nephropathy: Secondary | ICD-10-CM

## 2018-08-06 DIAGNOSIS — IMO0002 Reserved for concepts with insufficient information to code with codable children: Secondary | ICD-10-CM

## 2018-08-08 ENCOUNTER — Encounter: Payer: Self-pay | Admitting: Internal Medicine

## 2018-08-08 ENCOUNTER — Ambulatory Visit: Payer: Medicare Other | Admitting: Pharmacist

## 2018-08-08 ENCOUNTER — Other Ambulatory Visit: Payer: Self-pay

## 2018-08-08 ENCOUNTER — Ambulatory Visit (INDEPENDENT_AMBULATORY_CARE_PROVIDER_SITE_OTHER): Payer: Medicare Other | Admitting: Internal Medicine

## 2018-08-08 VITALS — BP 138/45 | HR 56 | Temp 98.3°F | Ht 63.0 in | Wt 149.4 lb

## 2018-08-08 DIAGNOSIS — Z9889 Other specified postprocedural states: Secondary | ICD-10-CM

## 2018-08-08 DIAGNOSIS — E1165 Type 2 diabetes mellitus with hyperglycemia: Secondary | ICD-10-CM

## 2018-08-08 DIAGNOSIS — E1129 Type 2 diabetes mellitus with other diabetic kidney complication: Secondary | ICD-10-CM | POA: Diagnosis not present

## 2018-08-08 DIAGNOSIS — N289 Disorder of kidney and ureter, unspecified: Secondary | ICD-10-CM

## 2018-08-08 DIAGNOSIS — IMO0002 Reserved for concepts with insufficient information to code with codable children: Secondary | ICD-10-CM

## 2018-08-08 DIAGNOSIS — I780 Hereditary hemorrhagic telangiectasia: Secondary | ICD-10-CM

## 2018-08-08 DIAGNOSIS — E118 Type 2 diabetes mellitus with unspecified complications: Secondary | ICD-10-CM

## 2018-08-08 DIAGNOSIS — Z794 Long term (current) use of insulin: Secondary | ICD-10-CM

## 2018-08-08 DIAGNOSIS — D500 Iron deficiency anemia secondary to blood loss (chronic): Secondary | ICD-10-CM

## 2018-08-08 LAB — GLUCOSE, CAPILLARY: Glucose-Capillary: 304 mg/dL — ABNORMAL HIGH (ref 70–99)

## 2018-08-08 LAB — POCT GLYCOSYLATED HEMOGLOBIN (HGB A1C): Hemoglobin A1C: 8.7 % — AB (ref 4.0–5.6)

## 2018-08-08 MED ORDER — FUROSEMIDE 20 MG PO TABS: 20.0000 mg | ORAL_TABLET | Freq: Every day | ORAL | 3 refills | Status: DC

## 2018-08-08 NOTE — Patient Instructions (Signed)
Ms. Fischbach, It was good to see you.  Today we discussed your diabetes. Since your sugars are difficult to control, I am placing a referral for a specialist to give further recommendations.  We will not make any changes for now.   See you back in 3 months!  Please call sooner if you have questions or concerns.   Take care! Dr. Koleen Distance

## 2018-08-08 NOTE — Progress Notes (Signed)
Patient was seen today in a co-visit, see documentation under Dr. Janne Napoleon visit for details.

## 2018-08-11 ENCOUNTER — Encounter: Payer: Self-pay | Admitting: *Deleted

## 2018-08-11 ENCOUNTER — Encounter: Payer: Self-pay | Admitting: Internal Medicine

## 2018-08-11 ENCOUNTER — Other Ambulatory Visit: Payer: Self-pay | Admitting: Hematology

## 2018-08-11 NOTE — Assessment & Plan Note (Signed)
Followed by Dr. Burr Medico. Requires blood transfusions every 2-3 weeks for chronic blood loss anemia.  She has responded well to Avastin and IV feraheme. Hgb normal on last CBC check 6/22.

## 2018-08-11 NOTE — Assessment & Plan Note (Signed)
Patient's CBGs continue to be labile and difficult to control despite coordinated efforts with Dr. Maudie Mercury and Butch Penny. A1C is not reliable given frequent blood transfusions.  Current regimen is [place] 21 units qhs and Trulicity 1.5 mg weekly. Options are limited due to renal function.  Reviewing glucometer shows a few hypoglycemic events, primarily before lunch and breakfast. The rest of the readings, she is mostly hyperglycemic into the mid to high 200s.  She is not interested in a continuous glucose monitor.  Feel she is appropriate for endocrinology referral given our continued difficulties with blood sugar control and hypoglycemia. Patient and her husband are agreeable. Will continue on current regimen for now and will follow-up recommendations from endocrinology.

## 2018-08-11 NOTE — Progress Notes (Signed)
   CC: DM  HPI:  [name] is a 78 y.o. female with PMHx listed below who presents for follow-up on her DM. Please see problem based charting for updates on her chronic medical conditions.   Past Medical History:  Diagnosis Date  . Chronic anemia   . Chronic diastolic CHF (congestive heart failure) ([place]) 10/03/2013  . Chronic GI bleeding    [name] 11/29/2014  . Family history of anesthesia complication    "niece has a hard time coming out" (09/15/2012)  . Frequent nosebleeds    chronic  . Gastric AV malformation    [name] 11/29/2014  . GERD (gastroesophageal reflux disease)   . 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 History of blood transfusion "several"  . History of epistaxis   . HTN (hypertension), benign 03/02/2012  . Hyperlipidemia   . Iron deficiency anemia    chronic infusions"  . Lichen planus    Both lower extremities  . 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 Overgrown toenails 03/18/2017  . Pneumonia 1990's X 2  . Pulmonary HTN ([place]) 04/01/2017   PASP 60mmHg on echo 09/2016  . Seizures ([place]) 09/2014  . Symptomatic anemia 11/29/2014  . Telangiectasia    Gastric   . 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   Review of Systems:  Review of Systems  Constitutional: Negative for chills and fever.  Eyes: Negative for double vision.  Respiratory: Negative for cough and shortness of breath.   Cardiovascular: Negative for chest pain and palpitations.  Gastrointestinal: Negative for abdominal pain, nausea and vomiting.  Musculoskeletal: Negative for falls.  Skin: Negative for rash.  Neurological: Negative for dizziness, focal weakness and headaches.    Physical Exam:  Vitals:   08/08/18 1348  BP: (!) 138/45  Pulse: (!) 56  Temp: 98.3 F (36.8 C)  TempSrc: Oral  SpO2: 98%  Weight: 149 lb 6.4 oz (67.8 kg)  Height: 5\' 3"  (1.6 m)   Physical Exam Constitutional:      General: She is not in acute distress.    Appearance: Normal appearance.  Eyes:     Conjunctiva/sclera: Conjunctivae normal.  Cardiovascular:     Rate and Rhythm: Normal rate and regular rhythm.  Pulmonary:     Effort: Pulmonary effort is normal.     Breath sounds: Normal breath sounds.  Abdominal:     Palpations: Abdomen is soft.     Tenderness: There is no abdominal tenderness.  Musculoskeletal:     Right lower leg: No edema.     Left lower leg: No edema.  Neurological:     General: No focal deficit present.     Mental Status: She is alert and oriented to person, place, and time.      Assessment & Plan:   See Encounters Tab for problem based charting.  Patient discussed with Dr. Angelia Mould

## 2018-08-15 ENCOUNTER — Other Ambulatory Visit: Payer: Self-pay

## 2018-08-15 ENCOUNTER — Telehealth: Payer: Self-pay | Admitting: *Deleted

## 2018-08-15 ENCOUNTER — Inpatient Hospital Stay: Payer: Medicare Other

## 2018-08-15 ENCOUNTER — Inpatient Hospital Stay (HOSPITAL_BASED_OUTPATIENT_CLINIC_OR_DEPARTMENT_OTHER): Payer: Medicare Other | Admitting: Nurse Practitioner

## 2018-08-15 ENCOUNTER — Encounter: Payer: Self-pay | Admitting: Nurse Practitioner

## 2018-08-15 ENCOUNTER — Telehealth: Payer: Self-pay

## 2018-08-15 ENCOUNTER — Inpatient Hospital Stay: Payer: Medicare Other | Attending: Hematology

## 2018-08-15 VITALS — BP 160/60 | HR 58 | Temp 97.8°F | Resp 17 | Ht 63.0 in | Wt 147.3 lb

## 2018-08-15 VITALS — BP 126/37 | HR 51 | Resp 14

## 2018-08-15 DIAGNOSIS — E785 Hyperlipidemia, unspecified: Secondary | ICD-10-CM | POA: Diagnosis not present

## 2018-08-15 DIAGNOSIS — K922 Gastrointestinal hemorrhage, unspecified: Secondary | ICD-10-CM | POA: Diagnosis not present

## 2018-08-15 DIAGNOSIS — R630 Anorexia: Secondary | ICD-10-CM | POA: Diagnosis not present

## 2018-08-15 DIAGNOSIS — M79604 Pain in right leg: Secondary | ICD-10-CM | POA: Diagnosis not present

## 2018-08-15 DIAGNOSIS — R5383 Other fatigue: Secondary | ICD-10-CM

## 2018-08-15 DIAGNOSIS — I780 Hereditary hemorrhagic telangiectasia: Secondary | ICD-10-CM

## 2018-08-15 DIAGNOSIS — Z794 Long term (current) use of insulin: Secondary | ICD-10-CM

## 2018-08-15 DIAGNOSIS — M542 Cervicalgia: Secondary | ICD-10-CM

## 2018-08-15 DIAGNOSIS — R040 Epistaxis: Secondary | ICD-10-CM

## 2018-08-15 DIAGNOSIS — I5032 Chronic diastolic (congestive) heart failure: Secondary | ICD-10-CM | POA: Insufficient documentation

## 2018-08-15 DIAGNOSIS — R413 Other amnesia: Secondary | ICD-10-CM | POA: Insufficient documentation

## 2018-08-15 DIAGNOSIS — D500 Iron deficiency anemia secondary to blood loss (chronic): Secondary | ICD-10-CM

## 2018-08-15 DIAGNOSIS — Z79899 Other long term (current) drug therapy: Secondary | ICD-10-CM | POA: Insufficient documentation

## 2018-08-15 DIAGNOSIS — I272 Pulmonary hypertension, unspecified: Secondary | ICD-10-CM | POA: Insufficient documentation

## 2018-08-15 DIAGNOSIS — IMO0002 Reserved for concepts with insufficient information to code with codable children: Secondary | ICD-10-CM

## 2018-08-15 DIAGNOSIS — E1122 Type 2 diabetes mellitus with diabetic chronic kidney disease: Secondary | ICD-10-CM | POA: Diagnosis not present

## 2018-08-15 DIAGNOSIS — I130 Hypertensive heart and chronic kidney disease with heart failure and stage 1 through stage 4 chronic kidney disease, or unspecified chronic kidney disease: Secondary | ICD-10-CM | POA: Diagnosis not present

## 2018-08-15 DIAGNOSIS — K219 Gastro-esophageal reflux disease without esophagitis: Secondary | ICD-10-CM | POA: Diagnosis not present

## 2018-08-15 DIAGNOSIS — E1121 Type 2 diabetes mellitus with diabetic nephropathy: Secondary | ICD-10-CM

## 2018-08-15 DIAGNOSIS — N183 Chronic kidney disease, stage 3 (moderate): Secondary | ICD-10-CM | POA: Diagnosis not present

## 2018-08-15 DIAGNOSIS — K31819 Angiodysplasia of stomach and duodenum without bleeding: Secondary | ICD-10-CM

## 2018-08-15 DIAGNOSIS — Q2733 Arteriovenous malformation of digestive system vessel: Secondary | ICD-10-CM

## 2018-08-15 DIAGNOSIS — K31811 Angiodysplasia of stomach and duodenum with bleeding: Secondary | ICD-10-CM

## 2018-08-15 DIAGNOSIS — Z5112 Encounter for antineoplastic immunotherapy: Secondary | ICD-10-CM | POA: Diagnosis not present

## 2018-08-15 DIAGNOSIS — Z95828 Presence of other vascular implants and grafts: Secondary | ICD-10-CM

## 2018-08-15 DIAGNOSIS — D509 Iron deficiency anemia, unspecified: Secondary | ICD-10-CM

## 2018-08-15 LAB — CBC WITH DIFFERENTIAL (CANCER CENTER ONLY)
Abs Immature Granulocytes: 0.01 10*3/uL (ref 0.00–0.07)
Basophils Absolute: 0 10*3/uL (ref 0.0–0.1)
Basophils Relative: 0 %
Eosinophils Absolute: 0.5 10*3/uL (ref 0.0–0.5)
Eosinophils Relative: 8 %
HCT: 35.1 % — ABNORMAL LOW (ref 36.0–46.0)
Hemoglobin: 12.2 g/dL (ref 12.0–15.0)
Immature Granulocytes: 0 %
Lymphocytes Relative: 12 %
Lymphs Abs: 0.7 10*3/uL (ref 0.7–4.0)
MCH: 31 pg (ref 26.0–34.0)
MCHC: 34.8 g/dL (ref 30.0–36.0)
MCV: 89.3 fL (ref 80.0–100.0)
Monocytes Absolute: 0.7 10*3/uL (ref 0.1–1.0)
Monocytes Relative: 13 %
Neutro Abs: 3.9 10*3/uL (ref 1.7–7.7)
Neutrophils Relative %: 67 %
Platelet Count: 149 10*3/uL — ABNORMAL LOW (ref 150–400)
RBC: 3.93 MIL/uL (ref 3.87–5.11)
RDW: 12.4 % (ref 11.5–15.5)
WBC Count: 5.8 10*3/uL (ref 4.0–10.5)
nRBC: 0 % (ref 0.0–0.2)

## 2018-08-15 LAB — TOTAL PROTEIN, URINE DIPSTICK: Protein, ur: NEGATIVE mg/dL

## 2018-08-15 LAB — FERRITIN: Ferritin: 44 ng/mL (ref 11–307)

## 2018-08-15 MED ORDER — SODIUM CHLORIDE 0.9% FLUSH
10.0000 mL | INTRAVENOUS | Status: DC | PRN
  Administered 2018-08-15: 10 mL
  Filled 2018-08-15: qty 10

## 2018-08-15 MED ORDER — SODIUM CHLORIDE 0.9 % IV SOLN
Freq: Once | INTRAVENOUS | Status: AC
  Administered 2018-08-15: 10:00:00 via INTRAVENOUS
  Filled 2018-08-15: qty 250

## 2018-08-15 MED ORDER — HEPARIN SOD (PORK) LOCK FLUSH 100 UNIT/ML IV SOLN
500.0000 [IU] | Freq: Once | INTRAVENOUS | Status: AC | PRN
  Administered 2018-08-15: 500 [IU]
  Filled 2018-08-15: qty 5

## 2018-08-15 MED ORDER — ONETOUCH VERIO VI STRP: ORAL_STRIP | 3 refills | Status: DC

## 2018-08-15 MED ORDER — SODIUM CHLORIDE 0.9 % IV SOLN
510.0000 mg | Freq: Once | INTRAVENOUS | Status: AC
  Administered 2018-08-15: 510 mg via INTRAVENOUS
  Filled 2018-08-15: qty 510

## 2018-08-15 MED ORDER — SODIUM CHLORIDE 0.9 % IV SOLN
4.5000 mg/kg | Freq: Once | INTRAVENOUS | Status: AC
  Administered 2018-08-15: 300 mg via INTRAVENOUS
  Filled 2018-08-15: qty 12

## 2018-08-15 NOTE — Telephone Encounter (Signed)
Called to speak with patient about doing in person office visit with Dr Radford Pax and her husband answered and stated that patient was to do Telephone visit. I explained that is fine and she does not come into the office and we will call her. He thanked me for calling.

## 2018-08-15 NOTE — Patient Instructions (Signed)
[place] Discharge Instructions for Patients Receiving Chemotherapy  Today you received the following chemotherapy agents: Avastin  To help prevent nausea and vomiting after your treatment, we encourage you to take your nausea medication as directed.   If you develop nausea and vomiting that is not controlled by your nausea medication, call the clinic.   BELOW ARE SYMPTOMS THAT SHOULD BE REPORTED IMMEDIATELY:  *FEVER GREATER THAN 100.5 F  *CHILLS WITH OR WITHOUT FEVER  NAUSEA AND VOMITING THAT IS NOT CONTROLLED WITH YOUR NAUSEA MEDICATION  *UNUSUAL SHORTNESS OF BREATH  *UNUSUAL BRUISING OR BLEEDING  TENDERNESS IN MOUTH AND THROAT WITH OR WITHOUT PRESENCE OF ULCERS  *URINARY PROBLEMS  *BOWEL PROBLEMS  UNUSUAL RASH Items with * indicate a potential emergency and should be followed up as soon as possible.  Feel free to call the clinic should you have any questions or concerns. The clinic phone number is [phone].  Please show the [place] at check-in to the Emergency Department and triage nurse.  Ferumoxytol injection (IV Iron) What is this medicine? FERUMOXYTOL is an iron complex. Iron is used to make healthy red blood cells, which carry oxygen and nutrients throughout the body. This medicine is used to treat iron deficiency anemia. This medicine may be used for other purposes; ask your health care provider or pharmacist if you have questions. COMMON BRAND NAME(S): Feraheme What should I tell my health care provider before I take this medicine? They need to know if you have any of these conditions:  anemia not caused by low iron levels  high levels of iron in the blood  magnetic resonance imaging (MRI) test scheduled  an unusual or allergic reaction to iron, other medicines, foods, dyes, or preservatives  pregnant or trying to get pregnant  breast-feeding How should I use this medicine? This medicine is for injection into a vein. It  is given by a health care professional in a hospital or clinic setting. Talk to your pediatrician regarding the use of this medicine in children. Special care may be needed. Overdosage: If you think you have taken too much of this medicine contact a poison control center or emergency room at once. NOTE: This medicine is only for you. Do not share this medicine with others. What if I miss a dose? It is important not to miss your dose. Call your doctor or health care professional if you are unable to keep an appointment. What may interact with this medicine? This medicine may interact with the following medications:  other iron products This list may not describe all possible interactions. Give your health care provider a list of all the medicines, herbs, non-prescription drugs, or dietary supplements you use. Also tell them if you smoke, drink alcohol, or use illegal drugs. Some items may interact with your medicine. What should I watch for while using this medicine? Visit your doctor or healthcare professional regularly. Tell your doctor or healthcare professional if your symptoms do not start to get better or if they get worse. You may need blood work done while you are taking this medicine. You may need to follow a special diet. Talk to your doctor. Foods that contain iron include: whole grains/cereals, dried fruits, beans, or peas, leafy green vegetables, and organ meats (liver, kidney). What side effects may I notice from receiving this medicine? Side effects that you should report to your doctor or health care professional as soon as possible:  allergic reactions like skin rash, itching or hives,  swelling of the face, lips, or tongue  breathing problems  changes in blood pressure  feeling faint or lightheaded, falls  fever or chills  flushing, sweating, or hot feelings  swelling of the ankles or feet Side effects that usually do not require medical attention (report to your doctor  or health care professional if they continue or are bothersome):  diarrhea  headache  nausea, vomiting  stomach pain This list may not describe all possible side effects. Call your doctor for medical advice about side effects. You may report side effects to FDA at 1-800-FDA-1088. Where should I keep my medicine? This drug is given in a hospital or clinic and will not be stored at home. NOTE: This sheet is a summary. It may not cover all possible information. If you have questions about this medicine, talk to your doctor, pharmacist, or health care provider.  2020 Elsevier/Gold Standard (2016-03-16 20:21:10)

## 2018-08-15 NOTE — Telephone Encounter (Signed)
Refill for test strips given. Increased Rx from 400 to 900 each.  Thanks!

## 2018-08-15 NOTE — Progress Notes (Signed)
[facility] [facility]   Telephone:(336) [phone] [phone]) [phone]   Clinic Follow up Note   Patient Care Team: Delice Bison, DO as PCP - General [name], MD as PCP - Cardiology (Cardiology) 08/15/2018  CHIEF COMPLAINT: f/u [place]   Hematology history:  1.Hereditary hemorrhagic telangiectasia (HHT),with epistaxis andfrequentGI bleeding. Previously under care of Dr. [name] 2.Anemia secondary to GI bleeding and iron deficiency, required blood transfusion every 3 to 4 weeks, IV Feraheme every 1 to 2 months  CURRENT THERAPY:  1. Iv feraheme as needed if ferritin<50 2.Avastin5mg /kg every 2 weeks, starting3/30/20, changed to every 4 weeks starting 07/18/18 3. Blood transfusion if Hg<8.0, (consider <7 due to COVID-19 blood shortage)  INTERVAL HISTORY: [name] returns for f/u and treatment as scheduled. She was last seen 07/18/18 and completed avastin. She is doing well today. Denies specific concerns. She is eating and drinking well. Denies increased fatigue. Has had only a few small nosebleeds in the interim. Uses nasal spray BID. Denies other bleeding. She has chronic stable leg pain, denies other pain. BG still not well controlled.   Otherwise, she denies n/v/c/d. Denies fever, chills, cough, chest pain, dyspnea, leg edema. Denies mucositis. Denies peripheral neuropathy   MEDICAL HISTORY:  Past Medical History:  Diagnosis Date  . Chronic anemia   . Chronic diastolic CHF (congestive heart failure) ([place]) 10/03/2013  . Chronic GI bleeding    [name] 11/29/2014  . Family history of anesthesia complication    "niece has a hard time coming out" (09/15/2012)  . Frequent nosebleeds    chronic  . Gastric AV malformation    [name] 11/29/2014  . GERD (gastroesophageal reflux disease)   . 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 History of blood transfusion "several"  . History of epistaxis   . HTN (hypertension), benign 03/02/2012  .  Hyperlipidemia   . Iron deficiency anemia    chronic infusions"  . Lichen planus    Both lower extremities  . 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 Overgrown toenails 03/18/2017  . Pneumonia 1990's X 2  . Pulmonary HTN ([place]) 04/01/2017   PASP 66mmHg on echo 09/2016  . Seizures ([place]) 09/2014  . Symptomatic anemia 11/29/2014  . Telangiectasia    Gastric   . 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    SURGICAL HISTORY: Past Surgical History:  Procedure Laterality Date  . CATARACT EXTRACTION     "I think it was just one eye"  . ESOPHAGOGASTRODUODENOSCOPY  02/26/2011   Procedure: ESOPHAGOGASTRODUODENOSCOPY (EGD);  Surgeon: [name], MD;  Location: [name];  Service: Endoscopy;  Laterality: N/A;  . ESOPHAGOGASTRODUODENOSCOPY N/A 11/08/2012   Procedure: ESOPHAGOGASTRODUODENOSCOPY (EGD);  Surgeon: [name], MD;  Location: [name];  Service: Endoscopy;  Laterality: N/A;  . ESOPHAGOGASTRODUODENOSCOPY N/A 10/04/2013   Procedure: ESOPHAGOGASTRODUODENOSCOPY (EGD);  Surgeon: Winfield Cunas., MD;  Location: [name];  Service: Endoscopy;  Laterality: N/A;  with APC on stand-by  . ESOPHAGOGASTRODUODENOSCOPY N/A 07/06/2014   Procedure: ESOPHAGOGASTRODUODENOSCOPY (EGD);  Surgeon: Clarene Essex, MD;  Location: [name];  Service: Endoscopy;  Laterality: N/A;  . ESOPHAGOGASTRODUODENOSCOPY N/A 09/05/2014   Procedure: ESOPHAGOGASTRODUODENOSCOPY (EGD);  Surgeon: Laurence Spates, MD;  Location: [name];  Service: Endoscopy;  Laterality: N/A;  APC on standby to control bleeding  . ESOPHAGOGASTRODUODENOSCOPY N/A 11/29/2014   Procedure: ESOPHAGOGASTRODUODENOSCOPY (EGD);  Surgeon: [name], MD;  Location: [facility] ENDOSCOPY;  Service: Endoscopy;  Laterality: N/A;  .  ESOPHAGOGASTRODUODENOSCOPY N/A 09/28/2015   Procedure: ESOPHAGOGASTRODUODENOSCOPY (EGD);  Surgeon: Clarene Essex, MD;  Location: [facility] ENDOSCOPY;  Service: Endoscopy;  Laterality: N/A;  .  ESOPHAGOGASTRODUODENOSCOPY (EGD) WITH PROPOFOL N/A 12/04/2017   Procedure: ESOPHAGOGASTRODUODENOSCOPY (EGD) WITH PROPOFOL;  Surgeon: [name], MD;  Location: [facility];  Service: Endoscopy;  Laterality: N/A;  . ESOPHAGOGASTRODUODENOSCOPY ENDOSCOPY  08/19/2006   with laser treatment  . HOT HEMOSTASIS N/A 07/06/2014   Procedure: HOT HEMOSTASIS (ARGON PLASMA COAGULATION/BICAP);  Surgeon: Clarene Essex, MD;  Location: [name];  Service: Endoscopy;  Laterality: N/A;  . HOT HEMOSTASIS N/A 09/28/2015   Procedure: HOT HEMOSTASIS (ARGON PLASMA COAGULATION/BICAP);  Surgeon: Clarene Essex, MD;  Location: [facility] ENDOSCOPY;  Service: Endoscopy;  Laterality: N/A;  . HOT HEMOSTASIS N/A 12/04/2017   Procedure: HOT HEMOSTASIS (ARGON PLASMA COAGULATION/BICAP);  Surgeon: [name], MD;  Location: [facility];  Service: Endoscopy;  Laterality: N/A;  . NASAL HEMORRHAGE CONTROL     "for bleeding"   . SAVORY DILATION  02/26/2011   Procedure: SAVORY DILATION;  Surgeon: [name], MD;  Location: [name];  Service: Endoscopy;  Laterality: N/A;  c-arm needed  . SUBMUCOSAL INJECTION  12/04/2017   Procedure: SUBMUCOSAL INJECTION;  Surgeon: [name], MD;  Location: [facility] ENDOSCOPY;  Service: Endoscopy;;    I have reviewed the social history and family history with the patient and they are unchanged from previous note.  ALLERGIES:  is allergic to aspirin.  MEDICATIONS:  Current Outpatient Medications  Medication Sig Dispense Refill  . augmented betamethasone dipropionate (DIPROLENE-AF) 0.05 % cream Apply 1 application topically daily.  2  . b complex vitamins capsule Take 1 capsule by mouth daily. 100 capsule 2  . cetaphil (CETAPHIL) lotion Apply 1 application topically as needed for dry skin. 236 mL 0  . donepezil (ARICEPT) 10 MG tablet Take 1 tablet (10 mg total) by mouth at bedtime. 90 tablet 4  . Dulaglutide (TRULICITY) 1.5 NI/6.2VO SOPN Inject 1.5 mg into the skin once a week. Every  Tuesday 4 mL 1  . Elastic Bandages & Supports (MEDICAL COMPRESSION STOCKINGS) MISC Wear as much as possible while awake to reduce swelling 2 each 0  . FERROCITE 324 MG TABS tablet TAKE 1 TABLET BY MOUTH TWICE DAILY 180 tablet 3  . furosemide (LASIX) 20 MG tablet Take 1 tablet (20 mg total) by mouth daily. 90 tablet 3  . glucose blood (ONETOUCH VERIO) test strip Use as instructed 4 times daily. E11.29, insulin requiring 400 each 3  . insulin degludec (TRESIBA FLEXTOUCH) 100 UNIT/ML SOPN FlexTouch Pen Inject 0.2 mLs (20 Units total) into the skin every morning. (Patient taking differently: Inject 21 Units into the skin every evening. ) 6 pen 3  . Insulin Pen Needle (B-D UF III MINI PEN NEEDLES) 31G X 5 MM MISC USE TO INJECT INSULIN  3 TIMES DAILY 100 each 0  . lactulose (CHRONULAC) 10 GM/15ML solution TAKE 15 ML BY MOUTH TWO TIMES DAILY 473 mL 2  . levETIRAcetam (KEPPRA) 500 MG tablet Take 1 tablet (500 mg total) by mouth 2 (two) times daily. 180 tablet 4  . memantine (NAMENDA) 10 MG tablet Take 1 tablet (10 mg total) by mouth 2 (two) times daily. 180 tablet 4  . ONETOUCH DELICA LANCETS FINE MISC Check blood sugar up to 4 times a day 300 each 4  . oxymetazoline (AFRIN) 0.05 % nasal spray Place 1 spray into both nostrils 2 (two) times daily as needed (Epistaxis). 30 mL 0  . pantoprazole (PROTONIX) 40  MG tablet Take 1 tablet by mouth once daily 180 tablet 0  . potassium chloride (K-DUR) 10 MEQ tablet Take 1 tablet by mouth once daily 90 tablet 0  . sodium chloride (OCEAN) 0.65 % SOLN nasal spray Place 1 spray into both nostrils as needed for congestion. 1 Bottle 5  . traMADol (ULTRAM) 50 MG tablet Take 1 tablet (50 mg total) by mouth every 12 (twelve) hours as needed for moderate pain. 14 tablet 0  . triamcinolone ointment (KENALOG) 0.5 % APPLY 1 APPLICATION TOPICALLY TO RASH TWICE DAILY FOR ITCHING 15 g 1  . calcium citrate-vitamin D (CITRACAL+D) 315-200 MG-UNIT tablet Take 1 tablet by mouth 2 (two)  times daily. 100 tablet 0  . oxycodone-acetaminophen (PERCOCET) 2.5-325 MG tablet Take 1-2 tablets by mouth at bedtime as needed for pain. 30 tablet 0   No current facility-administered medications for this visit.    Facility-Administered Medications Ordered in Other Visits  Medication Dose Route Frequency Provider Last Rate Last Dose  . 0.9 %  sodium chloride infusion   Intravenous Once Truitt Merle, MD      . bevacizumab (AVASTIN) 300 mg in sodium chloride 0.9 % 100 mL chemo infusion  4.5 mg/kg (Treatment Plan Recorded) Intravenous Once Truitt Merle, MD      . ferumoxytol [facility]) 510 mg in sodium chloride 0.9 % 100 mL IVPB  510 mg Intravenous Once Truitt Merle, MD      . heparin lock flush 100 unit/mL  500 Units Intracatheter Once PRN Truitt Merle, MD      . sodium chloride flush (NS) 0.9 % injection 10 mL  10 mL Intracatheter PRN Truitt Merle, MD        PHYSICAL EXAMINATION: ECOG PERFORMANCE STATUS: 1 - Symptomatic but completely ambulatory  Vitals:   08/15/18 0918  BP: (!) 160/60  Pulse: (!) 58  Resp: 17  Temp: 97.8 F (36.6 C)  SpO2: 100%   Filed Weights   08/15/18 0918  Weight: 147 lb 4.8 oz (66.8 kg)    GENERAL:alert, no distress and comfortable SKIN: no obvious rash  EYES:  sclera clear LUNGS: respirations even and unlabored  HEART:  no lower extremity edema ABDOMEN:abdomen round Musculoskeletal:no cyanosis of digits  NEURO: alert & oriented x 3 with fluent speech, presents in wheelchair PAC without erythema   LABORATORY DATA:  I have reviewed the data as listed CBC Latest Ref Rng & Units 08/15/2018 08/01/2018 07/18/2018  WBC 4.0 - 10.5 K/uL 5.8 7.1 5.2  Hemoglobin 12.0 - 15.0 g/dL 12.2 12.4 11.9(L)  Hematocrit 36.0 - 46.0 % 35.1(L) 37.2 36.3  Platelets 150 - 400 K/uL 149(L) 133(L) 143(L)     CMP Latest Ref Rng & Units 07/18/2018 06/20/2018 05/23/2018  Glucose 70 - 99 mg/dL 294(H) 343(H) 316(H)  BUN 8 - 23 mg/dL 24(H) 24(H) 19  Creatinine 0.44 - 1.00 mg/dL 1.62(H) 1.57(H)  1.44(H)  Sodium 135 - 145 mmol/L 142 143 141  Potassium 3.5 - 5.1 mmol/L 4.4 4.4 4.3  Chloride 98 - 111 mmol/L 106 108 104  CO2 22 - 32 mmol/L 24 24 27   Calcium 8.9 - 10.3 mg/dL 9.8 10.3 9.4  Total Protein 6.5 - 8.1 g/dL 6.6 7.0 6.3(L)  Total Bilirubin 0.3 - 1.2 mg/dL 0.4 0.4 0.3  Alkaline Phos 38 - 126 U/L 116 119 117  AST 15 - 41 U/L 37 22 27  ALT 0 - 44 U/L 32 23 22      RADIOGRAPHIC STUDIES: I have personally reviewed the radiological  images as listed and agreed with the findings in the report. No results found.   ASSESSMENT & PLAN: [name] Walthers Littleis a 78 y.o.femalewith   1.Hereditary hemorrhagic telangiectasia(HHT)), withepistaxis and frequent GI bleeding -she previously required frequent RBC transfusion due to epistaxis and recurrent GI bleeding. Last RBC 04/13/18.  -She began Avastin q2 weeks on 05/09/18, epistaxis improved. Avastin changed to q4 weeks on 07/18/18, she is tolerating well overall - last Feraheme 06/06/18 - continue IV Feraheme if ferritin <50  -Ms. Marinaccio appears well today. Labs reviewed, Hb 12.2 -continue avastin today and feraheme for ferritin 44 -she will return for f/u and next cycle in 4 weeks   2. Fatigue and anorexia  -multifactorial -weight overall stable  3. 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 Right Leg pain and Neck Pain  -chronic, multifactorial, possibly related to arthritis -f/u with PCP  7. DM,CHFand CKD stage III -She is on  insulin, DM not well controlled - HbA1c 8.7 on 08/08/18 -on lasix 20 mg daily; on dulaglutide and insulin for DM -f/u with PCP -I encouraged her to eat healthy diet and hydrate adequately  PLAN: -Labs reviewed, proceed with avastin today -IV Feraheme if ferritin <50, will get dose today with avastin  -Lab, f/u with Dr. Burr Medico, avastin in 4 weeks   All questions were answered. The patient knows to call the clinic with any problems, questions or concerns. No barriers to learning was detected.     Alla Feeling, NP 08/15/18

## 2018-08-17 ENCOUNTER — Encounter: Payer: Self-pay | Admitting: Cardiology

## 2018-08-17 ENCOUNTER — Telehealth: Payer: Self-pay | Admitting: Cardiology

## 2018-08-17 NOTE — Telephone Encounter (Signed)

## 2018-08-17 NOTE — Progress Notes (Signed)
Internal Medicine Clinic Attending  I saw and evaluated the patient.  I personally confirmed the key portions of the history and exam documented by Dr. Koleen Distance and I reviewed pertinent patient test results.  The assessment, diagnosis, and plan were formulated together and I agree with the documentation in the resident's note.   I personally reviewed the CGM data, the resident's interpretation, and agree with the intervention. Difficult to control diabetes with hyper and hypoglycemia.  At this point I think it is reasonable to refer to endocrinology for assistance.

## 2018-08-17 NOTE — Progress Notes (Signed)
Virtual Visit via Telephone Note   This visit type was conducted due to national recommendations for restrictions regarding the COVID-19 Pandemic (e.g. social distancing) in an effort to limit this patient's exposure and mitigate transmission in our community.  Due to her co-morbid illnesses, this patient is at least at moderate risk for complications without adequate follow up.  This format is felt to be most appropriate for this patient at this time.  The patient did not have access to video technology/had technical difficulties with video requiring transitioning to audio format only (telephone).  All issues noted in this document were discussed and addressed.  No physical exam could be performed with this format.  Please refer to the patient's chart for her  consent to telehealth for [facility]   Evaluation Performed:  Follow-up visit  This visit type was conducted due to national recommendations for restrictions regarding the COVID-19 Pandemic (e.g. social distancing).  This format is felt to be most appropriate for this patient at this time.  All issues noted in this document were discussed and addressed.  No physical exam was performed (except for noted visual exam findings with Video Visits).  Please refer to the patient's chart (MyChart message for video visits and phone note for telephone visits) for the patient's consent to telehealth for [facility].  Date:  08/18/2018   ID:  [name], DOB [birth year], MRN [id number]  Patient Location:  Home  Provider location:   [place]  PCP:  Delice Bison, DO  Cardiologist:  Fransico Him, MD  Electrophysiologist:  None   Chief Complaint:  Chronic diastolic CHF, HTN, HLD  History of Present Illness:    [name] is a 78 y.o. female who presents via audio/video conferencing for a telehealth visit today.    [name] is a 78 y.o. female with history of Osler-Weber-Rendu's disease, gastric AVMs, chronic iron  deficiency anemia requiring frequent transfusions, hypertension, chronic diastolic CHF, HLD, GERD, type 2 diabetes mellitus.  2D echo 03/2017 showed  normal LVEF with mild LVH and grade 2 DD moderate pulmonary hypertension PASP of 57 mmHg.  The patient does not have symptoms concerning for COVID-19 infection (fever, chills, cough, or new shortness of breath).    Prior CV studies:   The following studies were reviewed today:  2D echo  Past Medical History:  Diagnosis Date  . Chronic anemia   . Chronic diastolic CHF (congestive heart failure) ([place]) 10/03/2013  . Chronic GI bleeding    [name] 11/29/2014  . Family history of anesthesia complication    "niece has a hard time coming out" (09/15/2012)  . Frequent nosebleeds    chronic  . Gastric AV malformation    [name] 11/29/2014  . GERD (gastroesophageal reflux disease)   . 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 History of blood transfusion "several"  . History of epistaxis   . HTN (hypertension), benign 03/02/2012  . Hyperlipidemia   . Iron deficiency anemia    chronic infusions"  . Lichen planus    Both lower extremities  . 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[place])    [name] 11/29/2014  . Overgrown toenails 03/18/2017  . Pneumonia 1990's X 2  . Pulmonary HTN ([place]) 04/01/2017   PASP 88mmHg on echo 09/2016 and 15mmHg by echo 2019  . Seizures ([place]) 09/2014  . Symptomatic anemia 11/29/2014  . Telangiectasia    Gastric   . 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  Past Surgical History:  Procedure Laterality Date  . CATARACT EXTRACTION     "I think it was just one eye"  . ESOPHAGOGASTRODUODENOSCOPY  02/26/2011   Procedure: ESOPHAGOGASTRODUODENOSCOPY (EGD);  Surgeon: [name], MD;  Location: [name];  Service: Endoscopy;  Laterality: N/A;  . ESOPHAGOGASTRODUODENOSCOPY N/A 11/08/2012   Procedure: ESOPHAGOGASTRODUODENOSCOPY (EGD);  Surgeon: [name], MD;  Location: [name];  Service:  Endoscopy;  Laterality: N/A;  . ESOPHAGOGASTRODUODENOSCOPY N/A 10/04/2013   Procedure: ESOPHAGOGASTRODUODENOSCOPY (EGD);  Surgeon: Winfield Cunas., MD;  Location: [name];  Service: Endoscopy;  Laterality: N/A;  with APC on stand-by  . ESOPHAGOGASTRODUODENOSCOPY N/A 07/06/2014   Procedure: ESOPHAGOGASTRODUODENOSCOPY (EGD);  Surgeon: Clarene Essex, MD;  Location: [name];  Service: Endoscopy;  Laterality: N/A;  . ESOPHAGOGASTRODUODENOSCOPY N/A 09/05/2014   Procedure: ESOPHAGOGASTRODUODENOSCOPY (EGD);  Surgeon: Laurence Spates, MD;  Location: [name];  Service: Endoscopy;  Laterality: N/A;  APC on standby to control bleeding  . ESOPHAGOGASTRODUODENOSCOPY N/A 11/29/2014   Procedure: ESOPHAGOGASTRODUODENOSCOPY (EGD);  Surgeon: [name], MD;  [facility] END[facility]COPY;  Service: Endoscopy;  Laterality: N/A;  . ESOPHAGOGASTRODUODENOSCOPY N/A 09/28/2015   Procedure: ESOPHAGOGASTRODUODENOSCOPY (EGD);  Surgeon: Clarene Essex, MD;  Location: [facility] ENDOSCOPY;  Service: Endoscopy;  Laterality: N/A;  . ESOPHAGOGASTRODUODENOSCOPY (EGD) WITH PROPOFOL N/A 12/04/2017   Procedure: ESOPHAGOGASTRODUODENOSCOPY (EGD) WITH PROPOFOL;  Surgeon: [name], MD;  Location: [place];  Service: Endoscopy;  Laterality: N/A;  . ESOPHAGOGASTRODUODENOSCOPY ENDOSCOPY  08/19/2006   with laser treatment  . HOT HEMOSTASIS N/A 07/06/2014   Procedure: HOT HEMOSTASIS (ARGON PLASMA COAGULATION/BICAP);  Surgeon: Clarene Essex, MD;  Location: [name];  Service: Endoscopy;  Laterality: N/A;  . HOT HEMOSTASIS N/A 09/28/2015   Procedure: HOT HEMOSTASIS (ARGON PLASMA COAGULATION/BICAP);  Surgeon: Clarene Essex, MD;  Location[facility] ENDOSCOPY;  Service: Endoscopy;  Laterality: N/A;  . HOT HEMOSTASIS N/A 12/04/2017   Procedure: HOT HEMOSTASIS (ARGON PLASMA COAGULATION/BICAP);  Surgeon: [name], MD;  Location: [place];  Service: Endoscopy;  Laterality: N/A;  . NASAL HEMORRHAGE CONTROL     "for bleeding"   . SAVORY  DILATION  02/26/2011   Procedure: SAVORY DILATION;  Surgeon: [name], MD;  Location: [name];  Service: Endoscopy;  Laterality: N/A;  c-arm needed  . SUBMUCOSAL INJECTION  12/04/2017   Procedure: SUBMUCOSAL INJECTION;  Surgeon: [name], MD;  Location: [place];  Service: Endoscopy;;     Current Meds  Medication Sig  . augmented betamethasone dipropionate (DIPROLENE-AF) 0.05 % cream Apply 1 application topically daily.  Marland Kitchen b complex vitamins capsule Take 1 capsule by mouth daily.  . calcium citrate-vitamin D (CITRACAL+D) 315-200 MG-UNIT tablet Take 1 tablet by mouth 2 (two) times daily.  . cetaphil (CETAPHIL) lotion Apply 1 application topically as needed for dry skin.  Marland Kitchen donepezil (ARICEPT) 10 MG tablet Take 1 tablet (10 mg total) by mouth at bedtime.  . Dulaglutide (TRULICITY) 1.5 VO/5.3GU SOPN Inject 1.5 mg into the skin once a week. Every Tuesday  . Elastic Bandages & Supports (MEDICAL COMPRESSION STOCKINGS) MISC Wear as much as possible while awake to reduce swelling  . FERROCITE 324 MG TABS tablet TAKE 1 TABLET BY MOUTH TWICE DAILY  . furosemide (LASIX) 20 MG tablet Take 1 tablet (20 mg total) by mouth daily.  Marland Kitchen glucose blood (ONETOUCH VERIO) test strip Use as instructed 4 times daily. E11.29, insulin requiring  . insulin degludec (TRESIBA FLEXTOUCH) 100 UNIT/ML SOPN FlexTouch Pen Inject 0.2 mLs (20 Units total) into the skin every morning. (Patient  taking differently: Inject 21 Units into the skin every evening. )  . Insulin Pen Needle (B-D UF III MINI PEN NEEDLES) 31G X 5 MM MISC USE TO INJECT INSULIN  3 TIMES DAILY  . lactulose (CHRONULAC) 10 GM/15ML solution TAKE 15 ML BY MOUTH TWO TIMES DAILY  . levETIRAcetam (KEPPRA) 500 MG tablet Take 1 tablet (500 mg total) by mouth 2 (two) times daily.  . memantine (NAMENDA) 10 MG tablet Take 1 tablet (10 mg total) by mouth 2 (two) times daily.  Glory Rosebush DELICA LANCETS FINE MISC Check blood sugar up to 4 times a day  .  oxycodone-acetaminophen (PERCOCET) 2.5-325 MG tablet Take 1-2 tablets by mouth at bedtime as needed for pain.  Marland Kitchen oxymetazoline (AFRIN) 0.05 % nasal spray Place 1 spray into both nostrils 2 (two) times daily as needed (Epistaxis).  . pantoprazole (PROTONIX) 40 MG tablet Take 1 tablet by mouth once daily  . potassium chloride (K-DUR) 10 MEQ tablet Take 1 tablet (10 mEq total) by mouth daily.  . sodium chloride (OCEAN) 0.65 % SOLN nasal spray Place 1 spray into both nostrils as needed for congestion.  . traMADol (ULTRAM) 50 MG tablet Take 1 tablet (50 mg total) by mouth every 12 (twelve) hours as needed for moderate pain.  Marland Kitchen triamcinolone ointment (KENALOG) 0.5 % APPLY 1 APPLICATION TOPICALLY TO RASH TWICE DAILY FOR ITCHING  . [DISCONTINUED] potassium chloride (K-DUR) 10 MEQ tablet Take 1 tablet by mouth once daily     Allergies:   Aspirin   Social History   Tobacco Use  . Smoking status: Former Smoker    Packs/day: 1.00    Years: 20.00    Pack years: 20.00    Types: Cigarettes    Quit date: 02/10/1971    Years since quitting: 47.5  . Smokeless tobacco: Never Used  . Tobacco comment: 09/15/2012 "smoked 50-60 yr ago"  Substance Use Topics  . Alcohol use: No    Alcohol/week: 0.0 standard drinks  . Drug use: No     Family Hx: The patient's family history includes Breast cancer in an other family member; Stroke in her father. There is no history of Malignant hyperthermia or Seizures.  ROS:   Please see the history of present illness.     All other systems reviewed and are negative.   Labs/Other Tests and Data Reviewed:    Recent Labs: 07/18/2018: ALT 32; BUN 24; Creatinine 1.62; Potassium 4.4; Sodium 142 08/15/2018: Hemoglobin 12.2; Platelet Count 149   Recent Lipid Panel Lab Results  Component Value Date/Time   CHOL 141 11/29/2014 10:26 AM   TRIG 141 11/29/2014 10:26 AM   HDL 31 (L) 11/29/2014 10:26 AM   CHOLHDL 4.5 11/29/2014 10:26 AM   LDLCALC 82 11/29/2014 10:26 AM    Wt  Readings from Last 3 Encounters:  08/18/18 141 lb (64 kg)  08/15/18 147 lb 4.8 oz (66.8 kg)  08/08/18 149 lb 6.4 oz (67.8 kg)     Objective:    Vital Signs:  Ht 5\' 3"  (1.6 m)   Wt 141 lb (64 kg)   LMP  (LMP Unknown)   BMI 24.98 kg/m     ASSESSMENT & PLAN:    1.  Chronic diastolic CHF - she has not had any SOB or LE edema and her weight is stable and she actually has lost 11lbs since last OV.  She will continue on Lasix 20mg  daily. Her creatinine was stable at 1.62 on 07/18/2018 and K+ was 4.4. Due to  weight loss I have encouraged her to see her PCP.  She says that she has no appetite.   2.  Moderate pulmonary HTN - 2D echo 03/2017 showed a PASP of 53mmHg which improved from echo in 2018.  I will repeat echo to make sure this is stable.   3. Type 2 Dm - this is followed by her PCP.  She will continue on Dulaglutide injection weekly and Tresiba 20units qam.  HbA1C was 8.7.  4.  Hypertension -  She has not required any antihypertensive therapy.  COVID-19 Education: The signs and symptoms of COVID-19 were discussed with the patient and how to seek care for testing (follow up with PCP or arrange E-visit).  The importance of social distancing was discussed today.  Patient Risk:   After full review of this patient's clinical status, I feel that they are at least moderate risk at this time.  Time:   Today, I have spent 20 minutes directly with the patient on telephone discussing medical problems including CHF, HTN, pulmonary HTN.  We also reviewed the symptoms of COVID 19 and the ways to protect against contracting the virus with telehealth technology.  I spent an additional 5 minutes reviewing patient's chart including 2D echo, labs.  Medication Adjustments/Labs and Tests Ordered: Current medicines are reviewed at length with the patient today.  Concerns regarding medicines are outlined above.  Tests Ordered: No orders of the defined types were placed in this encounter.  Medication  Changes: Meds ordered this encounter  Medications  . potassium chloride (K-DUR) 10 MEQ tablet    Sig: Take 1 tablet (10 mEq total) by mouth daily.    Dispense:  90 tablet    Refill:  3    Disposition:  Follow up in 1 year(s)  Signed, Fransico Him, MD  08/18/2018 10:05 AM    [facility]

## 2018-08-18 ENCOUNTER — Encounter: Payer: Self-pay | Admitting: Cardiology

## 2018-08-18 ENCOUNTER — Other Ambulatory Visit: Payer: Self-pay

## 2018-08-18 ENCOUNTER — Telehealth (INDEPENDENT_AMBULATORY_CARE_PROVIDER_SITE_OTHER): Payer: Medicare Other | Admitting: Cardiology

## 2018-08-18 VITALS — BP 138/40 | Ht 63.0 in | Wt 141.0 lb

## 2018-08-18 DIAGNOSIS — E1165 Type 2 diabetes mellitus with hyperglycemia: Secondary | ICD-10-CM | POA: Diagnosis not present

## 2018-08-18 DIAGNOSIS — I5032 Chronic diastolic (congestive) heart failure: Secondary | ICD-10-CM

## 2018-08-18 DIAGNOSIS — I10 Essential (primary) hypertension: Secondary | ICD-10-CM | POA: Diagnosis not present

## 2018-08-18 DIAGNOSIS — E1129 Type 2 diabetes mellitus with other diabetic kidney complication: Secondary | ICD-10-CM

## 2018-08-18 DIAGNOSIS — IMO0002 Reserved for concepts with insufficient information to code with codable children: Secondary | ICD-10-CM

## 2018-08-18 DIAGNOSIS — I272 Pulmonary hypertension, unspecified: Secondary | ICD-10-CM

## 2018-08-18 MED ORDER — FUROSEMIDE 20 MG PO TABS: 20.0000 mg | ORAL_TABLET | Freq: Every day | ORAL | 3 refills | Status: DC

## 2018-08-18 MED ORDER — POTASSIUM CHLORIDE ER 10 MEQ PO TBCR: 10.0000 meq | EXTENDED_RELEASE_TABLET | Freq: Every day | ORAL | 3 refills | Status: DC

## 2018-08-18 NOTE — Patient Instructions (Signed)
Medication Instructions:  Your physician recommends that you continue on your current medications as directed. Please refer to the Current Medication list given to you today.  If you need a refill on your cardiac medications before your next appointment, please call your pharmacy.   Lab work: None If you have labs (blood work) drawn today and your tests are completely normal, you will receive your results only by: Marland Kitchen MyChart Message (if you have MyChart) OR . A paper copy in the mail If you have any lab test that is abnormal or we need to change your treatment, we will call you to review the results.  Testing/Procedures: Your physician has requested that you have an echocardiogram on 08/25/18 at 3:50 pm. Echocardiography is a painless test that uses sound waves to create images of your heart. It provides your doctor with information about the size and shape of your heart and how well your heart's chambers and valves are working. This procedure takes approximately one hour. There are no restrictions for this procedure.  Follow-Up: At [facility], you and your health needs are our priority.  As part of our continuing mission to provide you with exceptional heart care, we have created designated Provider Care Teams.  These Care Teams include your primary Cardiologist (physician) and Advanced Practice Providers (APPs -  Physician Assistants and Nurse Practitioners) who all work together to provide you with the care you need, when you need it. You will need a follow up appointment in 1 years.  Please call our office 2 months in advance to schedule this appointment.  You may see Fransico Him, MD or one of the following Advanced Practice Providers on your designated Care Team:   [place], PA-C Melina Copa, PA-C . Ermalinda Barrios, PA-C

## 2018-08-19 ENCOUNTER — Telehealth: Payer: Self-pay | Admitting: Pharmacist

## 2018-08-22 ENCOUNTER — Encounter (HOSPITAL_COMMUNITY): Payer: Self-pay

## 2018-08-22 ENCOUNTER — Ambulatory Visit (HOSPITAL_COMMUNITY)
Admission: EM | Admit: 2018-08-22 | Discharge: 2018-08-22 | Disposition: A | Discharge status: Home / Self Care | Payer: Medicare Other | Attending: Internal Medicine | Admitting: Internal Medicine

## 2018-08-22 ENCOUNTER — Other Ambulatory Visit: Payer: Self-pay

## 2018-08-22 DIAGNOSIS — R040 Epistaxis: Secondary | ICD-10-CM | POA: Diagnosis not present

## 2018-08-22 MED ORDER — SALINE SPRAY 0.65 % NA SOLN: 1.0000 | NASAL | 0 refills | Status: DC | PRN

## 2018-08-22 NOTE — ED Triage Notes (Signed)
Patient presents to Urgent Care with complaints of epistaxis x5 mins today. Patient reports she is not on blood thinners, applied pressure to get the bleeding to stop.

## 2018-08-22 NOTE — ED Provider Notes (Signed)
[place]    CSN: [id number] Arrival date & time: 08/22/18  4098      History   Chief Complaint Chief Complaint  Patient presents with  . Epistaxis    HPI MATINA RODIER is a 78 y.o. 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  Patient had an episode today that lasted longer than usual has a visit to the urgent care.  Patient uses oxymetazoline nasal spray as needed for nasal bleed.  This sometimes gives her some partial relief.  Patient applies Vaseline to the anterior naris occasionally.  She has saline nasal spray but does not use it on a regular basis.  She denies any dizziness, lightheadedness or feeling faint.  Patient is not on blood thinners  HPI  Past Medical History:  Diagnosis Date  . Chronic anemia   . Chronic diastolic CHF (congestive heart failure) ([place]) 10/03/2013  . Chronic GI bleeding    [name] 11/29/2014  . Family history of anesthesia complication    "niece has a hard time coming out" (09/15/2012)  . Frequent nosebleeds    chronic  . Gastric AV malformation    [name] 11/29/2014  . GERD (gastroesophageal reflux disease)   . 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 History of blood transfusion "several"  . History of epistaxis   . HTN (hypertension), benign 03/02/2012  . Hyperlipidemia   . Iron deficiency anemia    chronic infusions"  . Lichen planus    Both lower extremities  . 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 Overgrown toenails 03/18/2017  . Pneumonia 1990's X 2  . Pulmonary HTN ([place]) 04/01/2017   PASP 33mmHg on echo 09/2016 and 23mmHg by echo 2019  . Seizures ([place]) 09/2014  . Symptomatic anemia 11/29/2014  . Telangiectasia    Gastric   . 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    Patient Active Problem List   Diagnosis Date Noted   . Port-A-Cath in place 05/23/2018  . Iron deficiency anemia due to chronic blood loss 04/19/2018  . Acute right hip pain 03/10/2018  . Hyperpigmented skin lesion 02/07/2018  . Leg weakness, bilateral 12/23/2017  . Hematemesis 12/04/2017  . Pain localized to upper abdomen 10/05/2017  . Lower extremity pain, bilateral 07/15/2017  . Pulmonary HTN ([place]) 04/01/2017  . Heart murmur 04/01/2017  . Osteopenia after menopause 01/13/2017  . Epistaxis 07/30/2016  . Venous insufficiency of both lower extremities 04/27/2016  . Anemia due to chronic blood loss   . Dry skin 02/17/2016  . Peripheral vascular disease ([place]) 12/30/2015  . Pain in joint of right shoulder 10/22/2015  . PAF (paroxysmal atrial fibrillation) ([place]) 09/29/2015  . Lichen planus 11/91/4782  . Healthcare maintenance 06/20/2015  . Seborrheic keratosis 06/20/2015  . Memory loss 01/30/2015  . Simple febrile convulsions ([place]) 10/21/2014  . Hyperammonemia ([place]) 10/03/2014  . CKD (chronic kidney disease), stage II   . Chronic diastolic CHF (congestive heart failure) ([place]) 10/03/2013  . DM (diabetes mellitus), type 2, uncontrolled, with renal complications ([place]) 95/62/1308  . HTN (hypertension), benign 03/02/2012  . Fatigue 07/10/2011  . 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 CATARACT EXTRACTION     "I think it was just one  eye"  . ESOPHAGOGASTRODUODENOSCOPY  02/26/2011   Procedure: ESOPHAGOGASTRODUODENOSCOPY (EGD);  Surgeon: [name], MD;  Location: [name];  Service: Endoscopy;  Laterality: N/A;  . ESOPHAGOGASTRODUODENOSCOPY N/A 11/08/2012   Procedure: ESOPHAGOGASTRODUODENOSCOPY (EGD);  Surgeon: [name], MD;  Location: [name];  Service: Endoscopy;  Laterality: N/A;  . ESOPHAGOGASTRODUODENOSCOPY N/A 10/04/2013   Procedure: ESOPHAGOGASTRODUODENOSCOPY (EGD);  Surgeon: Winfield Cunas., MD;  Location: [name]  ENDOSCOPY;  Service: Endoscopy;  Laterality: N/A;  with APC on stand-by  . ESOPHAGOGASTRODUODENOSCOPY N/A 07/06/2014   Procedure: ESOPHAGOGASTRODUODENOSCOPY (EGD);  Surgeon: Clarene Essex, MD;  Location: [name];  Service: Endoscopy;  Laterality: N/A;  . ESOPHAGOGASTRODUODENOSCOPY N/A 09/05/2014   Procedure: ESOPHAGOGASTRODUODENOSCOPY (EGD);  Surgeon: Laurence Spates, MD;  Location: [name];  Service: Endoscopy;  Laterality: N/A;  APC on standby to control bleeding  . ESOPHAGOGASTRODUODENOSCOPY N/A 11/29/2014   Procedure: ESOPHAGOGASTRODUODENOSCOPY (EGD);  Surgeon: [name], MD;  Location: [facility] ENDOSCOPY;  Service: Endoscopy;  Laterality: N/A;  . ESOPHAGOGASTRODUODENOSCOPY N/A 09/28/2015   Procedure: ESOPHAGOGASTRODUODENOSCOPY (EGD);  Surgeon: Clarene Essex, MD;  Location: [facility] ENDOSCOPY;  Service: Endoscopy;  Laterality: N/A;  . ESOPHAGOGASTRODUODENOSCOPY (EGD) WITH PROPOFOL N/A 12/04/2017   Procedure: ESOPHAGOGASTRODUODENOSCOPY (EGD) WITH PROPOFOL;  Surgeon: [name], MD;  Location: [facility];  Service: Endoscopy;  Laterality: N/A;  . ESOPHAGOGASTRODUODENOSCOPY ENDOSCOPY  08/19/2006   with laser treatment  . HOT HEMOSTASIS N/A 07/06/2014   Procedure: HOT HEMOSTASIS (ARGON PLASMA COAGULATION/BICAP);  Surgeon: Clarene Essex, MD;  Location: [name];  Service: Endoscopy;  Laterality: N/A;  . HOT HEMOSTASIS N/A 09/28/2015   Procedure: HOT HEMOSTASIS (ARGON PLASMA COAGULATION/BICAP);  Surgeon: Clarene Essex, MD;  Location: [facility] ENDOSCOPY;  Service: Endoscopy;  Laterality: N/A;  . HOT HEMOSTASIS N/A 12/04/2017   Procedure: HOT HEMOSTASIS (ARGON PLASMA COAGULATION/BICAP);  Surgeon: [name], MD;  Location: [facility];  Service: Endoscopy;  Laterality: N/A;  . NASAL HEMORRHAGE CONTROL     "for bleeding"   . SAVORY DILATION  02/26/2011   Procedure: SAVORY DILATION;  Surgeon: [name], MD;  Location: [name];  Service: Endoscopy;  Laterality: N/A;  c-arm needed  .  SUBMUCOSAL INJECTION  12/04/2017   Procedure: SUBMUCOSAL INJECTION;  Surgeon: [name], MD;  Location: [facility] ENDOSCOPY;  Service: Endoscopy;;    OB History   No obstetric history on file.      Home Medications    Prior to Admission medications   Medication Sig Start Date End Date Taking? Authorizing Provider  augmented betamethasone dipropionate (DIPROLENE-AF) 0.05 % cream Apply 1 application topically daily. 11/16/17   [provider]  b complex vitamins capsule Take 1 capsule by mouth daily. 10/26/16   Alphonzo Grieve, MD  calcium citrate-vitamin D (CITRACAL+D) 315-200 MG-UNIT tablet Take 1 tablet by mouth 2 (two) times daily. 12/31/15 08/18/18  Ledell Noss, MD  cetaphil (CETAPHIL) lotion Apply 1 application topically as needed for dry skin. 02/18/16   Rice, Resa Miner, MD  donepezil (ARICEPT) 10 MG tablet Take 1 tablet (10 mg total) by mouth at bedtime. 11/10/17   Ward Givens, NP  Dulaglutide (TRULICITY) 1.5 QA/8.3MH SOPN Inject 1.5 mg into the skin once a week. Every Tuesday 07/20/18   Axel Filler, MD  Elastic Bandages & Supports ([place]) [place] Wear as much as possible while awake to reduce swelling 04/27/16   Minus Liberty, MD  FERROCITE 324 MG TABS tablet TAKE 1 TABLET BY MOUTH TWICE DAILY 09/27/17   Bartholomew Crews, MD  furosemide (LASIX) 20 MG tablet  Take 1 tablet (20 mg total) by mouth daily. 08/18/18 08/18/19  [name], MD  glucose blood (ONETOUCH VERIO) test strip Use as instructed 4 times daily. E11.29, insulin requiring 08/15/18   Bloomfield, Carley D, DO  insulin degludec (TRESIBA FLEXTOUCH) 100 UNIT/ML SOPN FlexTouch Pen Inject 0.2 mLs (20 Units total) into the skin every morning. Patient taking differently: Inject 21 Units into the skin every evening.  05/05/18   Bloomfield, Carley D, DO  Insulin Pen Needle (B-D UF III MINI PEN NEEDLES) 31G X 5 MM MISC USE TO INJECT INSULIN  3 TIMES DAILY 08/08/18   Bloomfield, Carley D,  DO  lactulose (CHRONULAC) 10 GM/15ML solution TAKE 15 ML BY MOUTH TWO TIMES DAILY 11/18/17   Bloomfield, Carley D, DO  levETIRAcetam (KEPPRA) 500 MG tablet Take 1 tablet (500 mg total) by mouth 2 (two) times daily. 11/10/17   Ward Givens, NP  memantine (NAMENDA) 10 MG tablet Take 1 tablet (10 mg total) by mouth 2 (two) times daily. 11/10/17   Ward Givens, NP  [facility] DELICA LANCETS FINE MISC Check blood sugar up to 4 times a day 09/20/17   Bartholomew Crews, MD  oxycodone-acetaminophen (PERCOCET) 2.5-325 MG tablet Take 1-2 tablets by mouth at bedtime as needed for pain. 03/29/18   [name], MD  oxymetazoline (AFRIN) 0.05 % nasal spray Place 1 spray into both nostrils 2 (two) times daily as needed (Epistaxis). 10/05/13   Robbie Lis, MD  pantoprazole (PROTONIX) 40 MG tablet Take 1 tablet by mouth once daily 08/01/18   Bloomfield, Carley D, DO  potassium chloride (K-DUR) 10 MEQ tablet Take 1 tablet (10 mEq total) by mouth daily. 08/18/18   [name], MD  sodium chloride (OCEAN) 0.65 % SOLN nasal spray Place 1 spray into both nostrils as needed for congestion. 07/30/16   [name], DO  traMADol (ULTRAM) 50 MG tablet Take 1 tablet (50 mg total) by mouth every 12 (twelve) hours as needed for moderate pain. 03/09/18   Masoudi, Elhamalsadat, MD  triamcinolone ointment (KENALOG) 0.5 % APPLY 1 APPLICATION TOPICALLY TO RASH TWICE DAILY FOR ITCHING 02/07/18   Alphonzo Grieve, MD    Family History Family History  Problem Relation Age of Onset  . Stroke Father   . Healthy Mother   . Breast cancer Other   . Malignant hyperthermia Neg Hx   . Seizures Neg Hx     Social History Social History   Tobacco Use  . Smoking status: Former Smoker    Packs/day: 1.00    Years: 20.00    Pack years: 20.00    Types: Cigarettes    Quit date: 02/10/1971    Years since quitting: 47.5  . Smokeless tobacco: Never Used  . Tobacco comment: 09/15/2012 "smoked 50-60 yr ago"  Substance Use  Topics  . Alcohol use: No    Alcohol/week: 0.0 standard drinks  . Drug use: No     Allergies   Aspirin   Review of Systems Review of Systems  Constitutional: Negative for activity change and appetite change.  HENT: Negative for congestion, ear discharge, ear pain, rhinorrhea, sinus pressure, sneezing and sore throat.   Cardiovascular: Negative.   Gastrointestinal: Negative.   Genitourinary: Negative.   Neurological: Negative.   Hematological: Negative.      Physical Exam Triage Vital Signs ED Triage Vitals  Enc Vitals Group     BP 08/22/18 1955 (!) 124/51     Pulse Rate 08/22/18 1955 60  Resp 08/22/18 1955 16     Temp 08/22/18 1955 98.4 F (36.9 C)     Temp Source 08/22/18 1955 Oral     SpO2 08/22/18 1955 100 %     Weight --      Height --      Head Circumference --      Peak Flow --      Pain Score 08/22/18 1953 0     Pain Loc --      Pain Edu? --      Excl. in [place]? --    No data found.  Updated Vital Signs BP (!) 124/51 (BP Location: Left Arm)   Pulse 60   Temp 98.4 F (36.9 C) (Oral)   Resp 16   LMP  (LMP Unknown)   SpO2 100%   Visual Acuity Right Eye Distance:   Left Eye Distance:   Bilateral Distance:    Right Eye Near:   Left Eye Near:    Bilateral Near:     Physical Exam   UC Treatments / Results  Labs (all labs ordered are listed, but only abnormal results are displayed) Labs Reviewed - No data to display  EKG   Radiology No results found.  Procedures Procedures (including critical care time)  Medications Ordered in UC Medications - No data to display  Initial Impression / Assessment and Plan / UC Course  I have reviewed the triage vital signs and the nursing notes.  Pertinent labs & imaging results that were available during my care of the patient were reviewed by me and considered in my medical decision making (see chart for details).     1.  Recurrent epistaxis: Patient is encouraged to use saline nasal spray on a  more consistent basis She needs to continue applying Vaseline to the anterior naris Oxymetazoline as needed for epistaxis Most recent CBC showed a hemoglobin of 12.2. Patient is hemodynamically stable.  No need for further testing. Final Clinical Impressions(s) / UC Diagnoses   Final diagnoses:  None   Discharge Instructions   None    ED Prescriptions    None     Controlled Substance Prescriptions [place] Controlled Substance Registry consulted? No   Chase Picket, MD 08/22/18 2024

## 2018-08-24 NOTE — Telephone Encounter (Signed)
Tried reaching patient for follow up/help with home DM management, unable to reach.

## 2018-08-25 ENCOUNTER — Ambulatory Visit (HOSPITAL_COMMUNITY): Payer: Medicare Other | Attending: Internal Medicine

## 2018-08-25 ENCOUNTER — Other Ambulatory Visit: Payer: Self-pay

## 2018-08-25 DIAGNOSIS — I272 Pulmonary hypertension, unspecified: Secondary | ICD-10-CM | POA: Insufficient documentation

## 2018-08-31 ENCOUNTER — Other Ambulatory Visit: Payer: Self-pay

## 2018-08-31 ENCOUNTER — Ambulatory Visit (INDEPENDENT_AMBULATORY_CARE_PROVIDER_SITE_OTHER): Payer: Medicare Other | Admitting: Endocrinology

## 2018-08-31 ENCOUNTER — Telehealth: Payer: Self-pay | Admitting: Endocrinology

## 2018-08-31 ENCOUNTER — Encounter: Payer: Self-pay | Admitting: Endocrinology

## 2018-08-31 ENCOUNTER — Telehealth: Payer: Self-pay | Admitting: *Deleted

## 2018-08-31 ENCOUNTER — Telehealth: Payer: Self-pay

## 2018-08-31 DIAGNOSIS — E1165 Type 2 diabetes mellitus with hyperglycemia: Secondary | ICD-10-CM | POA: Diagnosis not present

## 2018-08-31 DIAGNOSIS — N182 Chronic kidney disease, stage 2 (mild): Secondary | ICD-10-CM

## 2018-08-31 DIAGNOSIS — Z794 Long term (current) use of insulin: Secondary | ICD-10-CM | POA: Diagnosis not present

## 2018-08-31 DIAGNOSIS — I272 Pulmonary hypertension, unspecified: Secondary | ICD-10-CM

## 2018-08-31 MED ORDER — INSULIN LISPRO (1 UNIT DIAL) 100 UNIT/ML (KWIKPEN): PEN_INJECTOR | SUBCUTANEOUS | 1 refills | Status: DC

## 2018-08-31 NOTE — Telephone Encounter (Signed)
Staff message sent to Gae Bon per [facility] web portal no PA is required for sleep study. Ok to schedule. Decision ID # [id number]: [id number].

## 2018-08-31 NOTE — Telephone Encounter (Signed)
New rx sent

## 2018-08-31 NOTE — Telephone Encounter (Signed)
-----   Message from Damian Leavell, RN sent at 08/31/2018  2:03 PM EDT ----- Regarding: orders by Dr. Radford Pax Dr. Radford Pax ordered: sleep study, VQ scan and PFT for pulmonary hypertension

## 2018-08-31 NOTE — Telephone Encounter (Signed)
[facility] called in regards to the directions on insulin lispro (HUMALOG KWIKPEN) 100 UNIT/ML KwikPen, need clarification. Also, stated she is out of medication.  Please Advise, Thanks

## 2018-08-31 NOTE — Telephone Encounter (Signed)
Pharmacy is wanting to know exactly what you mean on "increase as directed" on the insulin sig. Pharmacy stated that this was too ambiguous.

## 2018-08-31 NOTE — Progress Notes (Addendum)
Patient ID: [name], female   DOB: 11-Dec-1940, 78 y.o.   MRN: [id number]          Today's office visit was provided via telemedicine using a telephone call to the patient Patient has been explained the limitations of evaluation and management by telemedicine and the availability of in person appointments.  The patient understood the limitations and agreed to proceed. Patient also understood that the telehealth visit is billable. . Location of the patient: Home . Location of the provider: Office Only the patient and myself were participating in the encounter  Reason for Appointment: Consultation for Type 2 Diabetes  Referring PCP: Koleen Distance   History of Present Illness:          Date of diagnosis of type 2 diabetes mellitus: 1988       Background history:   Patient has been on insulin for a few years and she is unclear when this was started, according to Epic she was started on Levemir in 2015 Prior to that likely had been on various oral hypoglycemic agents including metformin, glipizide and Tradjenta Her level of control has been somewhat variable but not consistently poor; her A1c has ranged between 5.6 and 7.6  Recent history:   Most recent A1c is 8.7 done on 08/08/2018  INSULIN regimen is:  [place] 21 units in the mornings daily      Non-insulin hypoglycemic drugs the patient is taking are: Trulicity 1.5 mg weekly  Current management, blood sugar patterns and problems identified:  She apparently has had problems recurrent hypoglycemia over the last few months but mostly in the mornings on waking up   She says that daytime on her meter is not programmed accurately since this was not downloaded today not clear if her readings below are accurate  She has no meal plan to go by and has irregular mealtimes  She will frequently eat only cereal in the morning, sometimes late in the morning  She drinks grape juice regularly and occasionally regular soft drinks also   Unable to be active because of weakness in her legs  Her weight recently has been about the same although has been as much as 149 pounds earlier this year  No consistent pattern seen with her blood sugars but she is checking readings mostly in the morning hours and only rarely in the afternoon or evening        Side effects from medications have been: None known     Meal times are:  Breakfast is at variable times  Typical meal intake: Breakfast is usually raisin Bran               Exercise:  Unable to do any  Glucose monitoring:  done 1-2 times a day         Glucometer: One Touch Verio.       Blood Glucose readings by time of day and averages from meter download:  PREMEAL Breakfast Lunch Dinner Bedtime  Overall   Glucose range:  57-275  153-237  330  208,228 ?  Median:        POST-MEAL  11 AM  PC Lunch PC Dinner  Glucose range:  91-248     Median:       Dietician visit, most recent: 2019  Weight history:  Wt Readings from Last 3 Encounters:  08/18/18 141 lb (64 kg)  08/15/18 147 lb 4.8 oz (66.8 kg)  08/08/18 149 lb 6.4 oz (67.8 kg)    Glycemic control:  Lab Results  Component Value Date   HGBA1C 8.7 (A) 08/08/2018   HGBA1C 5.9 (A) 07/15/2017   HGBA1C 5.6 09/28/2016   Lab Results  Component Value Date   LDLCALC 82 11/29/2014   CREATININE 1.62 (H) 07/18/2018   Lab Results  Component Value Date   MICRALBCREAT <5.5 06/20/2015    No results found for: FRUCTOSAMINE  No visits with results within 1 Week(s) from this visit.  Latest known visit with results is:  Appointment on 08/15/2018  Component Date Value Ref Range Status  . WBC Count 08/15/2018 5.8  4.0 - 10.5 K/uL Final  . RBC 08/15/2018 3.93  3.87 - 5.11 MIL/uL Final  . Hemoglobin 08/15/2018 12.2  12.0 - 15.0 g/dL Final  . HCT 08/15/2018 35.1* 36.0 - 46.0 % Final  . MCV 08/15/2018 89.3  80.0 - 100.0 fL Final  . MCH 08/15/2018 31.0  26.0 - 34.0 pg Final  . MCHC 08/15/2018 34.8  30.0 - 36.0 g/dL Final   . RDW 08/15/2018 12.4  11.5 - 15.5 % Final  . Platelet Count 08/15/2018 149* 150 - 400 K/uL Final  . nRBC 08/15/2018 0.0  0.0 - 0.2 % Final  . Neutrophils Relative % 08/15/2018 67  % Final  . Neutro Abs 08/15/2018 3.9  1.7 - 7.7 K/uL Final  . Lymphocytes Relative 08/15/2018 12  % Final  . Lymphs Abs 08/15/2018 0.7  0.7 - 4.0 K/uL Final  . Monocytes Relative 08/15/2018 13  % Final  . Monocytes Absolute 08/15/2018 0.7  0.1 - 1.0 K/uL Final  . Eosinophils Relative 08/15/2018 8  % Final  . Eosinophils Absolute 08/15/2018 0.5  0.0 - 0.5 K/uL Final  . Basophils Relative 08/15/2018 0  % Final  . Basophils Absolute 08/15/2018 0.0  0.0 - 0.1 K/uL Final  . Immature Granulocytes 08/15/2018 0  % Final  . Abs Immature Granulocytes 08/15/2018 0.01  0.00 - 0.07 K/uL Final   Performed at [facility], [place] [address], [place], [place] [address]  . Ferritin 08/15/2018 44  11 - 307 ng/mL Final   Performed at [facility], [place] [address], [place], [place] [address]  . Protein, ur 08/15/2018 NEGATIVE  NEGATIVE mg/dL Final   Performed at [facility], [place] [address], [place], [place] [address]    Allergies as of 08/31/2018      Reactions   Aspirin Nausea And Vomiting      Medication List       Accurate as of August 31, 2018  1:04 PM. If you have any questions, ask your nurse or doctor.        augmented betamethasone dipropionate 0.05 % cream Commonly known as: DIPROLENE-AF Apply 1 application topically daily.   b complex vitamins capsule Take 1 capsule by mouth daily.   B-D UF III MINI PEN NEEDLES 31G X 5 MM Misc Generic drug: Insulin Pen Needle USE TO INJECT INSULIN  3 TIMES DAILY   calcium citrate-vitamin D 315-200 MG-UNIT tablet Commonly known as: CITRACAL+D Take 1 tablet by mouth 2 (two) times daily.   cetaphil lotion Apply 1 application topically as needed for dry skin.   donepezil 10 MG tablet Commonly known  as: ARICEPT Take 1 tablet (10 mg total) by mouth at bedtime.   Dulaglutide 1.5 MG/0.5ML Sopn Commonly known as: Trulicity Inject 1.5 mg into the skin once a week. Every Tuesday   Ferrocite 324 (106 Fe) MG Tabs tablet Generic drug: Ferrous Fumarate  TAKE 1 TABLET BY MOUTH TWICE DAILY   furosemide 20 MG tablet Commonly known as: LASIX Take 1 tablet (20 mg total) by mouth daily.   insulin degludec 100 UNIT/ML Sopn FlexTouch Pen Commonly known as: Tyler Aas FlexTouch Inject 0.2 mLs (20 Units total) into the skin every morning. What changed:   how much to take  when to take this   lactulose 10 GM/15ML solution Commonly known as: CHRONULAC TAKE 15 ML BY MOUTH TWO TIMES DAILY   levETIRAcetam 500 MG tablet Commonly known as: KEPPRA Take 1 tablet (500 mg total) by mouth 2 (two) times daily.   Medical Compression Stockings Misc Wear as much as possible while awake to reduce swelling   memantine 10 MG tablet Commonly known as: NAMENDA Take 1 tablet (10 mg total) by mouth 2 (two) times daily.   OneTouch Delica Lancets Fine Misc Check blood sugar up to 4 times a day   OneTouch Verio test strip Generic drug: glucose blood Use as instructed 4 times daily. E11.29, insulin requiring   oxycodone-acetaminophen 2.5-325 MG tablet Commonly known as: PERCOCET Take 1-2 tablets by mouth at bedtime as needed for pain.   oxymetazoline 0.05 % nasal spray Commonly known as: AFRIN Place 1 spray into both nostrils 2 (two) times daily as needed (Epistaxis).   pantoprazole 40 MG tablet Commonly known as: PROTONIX Take 1 tablet by mouth once daily   potassium chloride 10 MEQ tablet Commonly known as: K-DUR Take 1 tablet (10 mEq total) by mouth daily.   sodium chloride 0.65 % Soln nasal spray Commonly known as: OCEAN Place 1 spray into both nostrils as needed for congestion.   traMADol 50 MG tablet Commonly known as: Ultram Take 1 tablet (50 mg total) by mouth every 12 (twelve) hours as  needed for moderate pain.   triamcinolone ointment 0.5 % Commonly known as: KENALOG APPLY 1 APPLICATION TOPICALLY TO RASH TWICE DAILY FOR ITCHING       Allergies:  Allergies  Allergen Reactions  . Aspirin Nausea And Vomiting    Past Medical History:  Diagnosis Date  . Chronic anemia   . Chronic diastolic CHF (congestive heart failure) ([place]) 10/03/2013  . Chronic GI bleeding    [name] 11/29/2014  . Family history of anesthesia complication    "niece has a hard time coming out" (09/15/2012)  . Frequent nosebleeds    chronic  . Gastric AV malformation    [name] 11/29/2014  . GERD (gastroesophageal reflux disease)   . 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 History of blood transfusion "several"  . History of epistaxis   . HTN (hypertension), benign 03/02/2012  . Hyperlipidemia   . Iron deficiency anemia    chronic infusions"  . Lichen planus    Both lower extremities  . 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 Overgrown toenails 03/18/2017  . Pneumonia 1990's X 2  . Pulmonary HTN ([place]) 04/01/2017   PASP 12mmHg on echo 09/2016 and 87mmHg by echo 2019  . Seizures ([place]) 09/2014  . Symptomatic anemia 11/29/2014  . Telangiectasia    Gastric   . 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    Past Surgical History:  Procedure Laterality Date  . CATARACT EXTRACTION     "I think it was just one eye"  . ESOPHAGOGASTRODUODENOSCOPY  02/26/2011   Procedure: ESOPHAGOGASTRODUODENOSCOPY (EGD);  Surgeon: [name], MD;  Location: [name];  Service: Endoscopy;  Laterality: N/A;  .  ESOPHAGOGASTRODUODENOSCOPY N/A 11/08/2012   Procedure: ESOPHAGOGASTRODUODENOSCOPY (EGD);  Surgeon: [name], MD;  Location: [name];  Service: Endoscopy;  Laterality: N/A;  . ESOPHAGOGASTRODUODENOSCOPY N/A 10/04/2013   Procedure: ESOPHAGOGASTRODUODENOSCOPY (EGD);  Surgeon: Winfield Cunas., MD;  Location: [name];  Service: Endoscopy;   Laterality: N/A;  with APC on stand-by  . ESOPHAGOGASTRODUODENOSCOPY N/A 07/06/2014   Procedure: ESOPHAGOGASTRODUODENOSCOPY (EGD);  Surgeon: Clarene Essex, MD;  Location: [name];  Service: Endoscopy;  Laterality: N/A;  . ESOPHAGOGASTRODUODENOSCOPY N/A 09/05/2014   Procedure: ESOPHAGOGASTRODUODENOSCOPY (EGD);  Surgeon: Laurence Spates, MD;  Location: [name];  Service: Endoscopy;  Laterality: N/A;  APC on standby to control bleeding  . ESOPHAGOGASTRODUODENOSCOPY N/A 11/29/2014   Procedure: ESOPHAGOGASTRODUODENOSCOPY (EGD);  Surgeon: [name], MD;  Location: [facility] ENDOSCOPY;  Service: Endoscopy;  Laterality: N/A;  . ESOPHAGOGASTRODUODENOSCOPY N/A 09/28/2015   Procedure: ESOPHAGOGASTRODUODENOSCOPY (EGD);  Surgeon: Clarene Essex, MD;  [facility] ENDOSCOPY;  Service: Endoscopy;  Laterality: N/A;  . ESOPHAGOGASTRODUODENOSCOPY (EGD) WITH PROPOFOL N/A 12/04/2017   Procedure: ESOPHAGOGASTRODUODENOSCOPY (EGD) WITH PROPOFOL;  Surgeon: [name], MD;  Location: [facility];  Service: Endoscopy;  Laterality: N/A;  . ESOPHAGOGASTRODUODENOSCOPY ENDOSCOPY  08/19/2006   with laser treatment  . HOT HEMOSTASIS N/A 07/06/2014   Procedure: HOT HEMOSTASIS (ARGON PLASMA COAGULATION/BICAP);  Surgeon: Clarene Essex, MD;  Location: [name];  Service: Endoscopy;  Laterality: N/A;  . HOT HEMOSTASIS N/A 09/28/2015   Procedure: HOT HEMOSTASIS (ARGON PLASMA COAGULATION/BICAP);  Surgeon: Clarene Essex, MD;  Location: [facility] ENDOSCOPY;  Service: Endoscopy;  Laterality: N/A;  . HOT HEMOSTASIS N/A 12/04/2017   Procedure: HOT HEMOSTASIS (ARGON PLASMA COAGULATION/BICAP);  Surgeon: [name], MD;  Location: [facility];  Service: Endoscopy;  Laterality: N/A;  . NASAL HEMORRHAGE CONTROL     "for bleeding"   . SAVORY DILATION  02/26/2011   Procedure: SAVORY DILATION;  Surgeon: [name], MD;  Location: [name];  Service: Endoscopy;  Laterality: N/A;  c-arm needed  . SUBMUCOSAL INJECTION  12/04/2017    Procedure: SUBMUCOSAL INJECTION;  Surgeon: [name], MD;  Location: [facility] ENDOSCOPY;  Service: Endoscopy;;    Family History  Problem Relation Age of Onset  . Stroke Father   . Healthy Mother   . Breast cancer Other   . Malignant hyperthermia Neg Hx   . Seizures Neg Hx     Social History:  reports that she quit smoking about 47 years ago. Her smoking use included cigarettes. She has a 20.00 pack-year smoking history. She has never used smokeless tobacco. She reports that she does not drink alcohol or use drugs.   Review of Systems  Constitutional: Positive for weight loss.  HENT: Negative for headaches.   Eyes: Positive for blurred vision.  Cardiovascular: Negative for chest pain.  Gastrointestinal: Negative for nausea.  Endocrine: Negative for polydipsia and light-headedness.  Genitourinary: Positive for nocturia.       3x  Musculoskeletal: Negative for joint pain.  Skin: Negative for rash.  Neurological: Positive for weakness.       Has difficulty remembering and is on Aricept for dementia.  She does get help from her husband  Psychiatric/Behavioral: Negative for insomnia.    Hematology: She has had iron deficiency anemia requiring infusions every 1 to 2 months  Lipid history: Not available, not on any statin drugs    Lab Results  Component Value Date   CHOL 141 11/29/2014   HDL 31 (L) 11/29/2014   LDLCALC 82 11/29/2014   TRIG 141 11/29/2014   CHOLHDL 4.5  11/29/2014           Hypertension: Has not been present  BP Readings from Last 3 Encounters:  08/22/18 (!) 124/51  08/18/18 (!) 138/40  08/15/18 (!) 126/37    Most recent eye exam was in 07/2018  Most recent foot exam: 06/2018 by podiatrist with no evidence of sensory loss or reduction in sensation  Currently known complications of diabetes: Retinopathy  LABS:  No visits with results within 1 Week(s) from this visit.  Latest known visit with results is:  Appointment on 08/15/2018  Component Date  Value Ref Range Status  . WBC Count 08/15/2018 5.8  4.0 - 10.5 K/uL Final  . RBC 08/15/2018 3.93  3.87 - 5.11 MIL/uL Final  . Hemoglobin 08/15/2018 12.2  12.0 - 15.0 g/dL Final  . HCT 08/15/2018 35.1* 36.0 - 46.0 % Final  . MCV 08/15/2018 89.3  80.0 - 100.0 fL Final  . MCH 08/15/2018 31.0  26.0 - 34.0 pg Final  . MCHC 08/15/2018 34.8  30.0 - 36.0 g/dL Final  . RDW 08/15/2018 12.4  11.5 - 15.5 % Final  . Platelet Count 08/15/2018 149* 150 - 400 K/uL Final  . nRBC 08/15/2018 0.0  0.0 - 0.2 % Final  . Neutrophils Relative % 08/15/2018 67  % Final  . Neutro Abs 08/15/2018 3.9  1.7 - 7.7 K/uL Final  . Lymphocytes Relative 08/15/2018 12  % Final  . Lymphs Abs 08/15/2018 0.7  0.7 - 4.0 K/uL Final  . Monocytes Relative 08/15/2018 13  % Final  . Monocytes Absolute 08/15/2018 0.7  0.1 - 1.0 K/uL Final  . Eosinophils Relative 08/15/2018 8  % Final  . Eosinophils Absolute 08/15/2018 0.5  0.0 - 0.5 K/uL Final  . Basophils Relative 08/15/2018 0  % Final  . Basophils Absolute 08/15/2018 0.0  0.0 - 0.1 K/uL Final  . Immature Granulocytes 08/15/2018 0  % Final  . Abs Immature Granulocytes 08/15/2018 0.01  0.00 - 0.07 K/uL Final   Performed at [facility], [place] [address], [place], [place] [address]  . Ferritin 08/15/2018 44  11 - 307 ng/mL Final   Performed at [facility], [place] [address], [place], [place] [address]  . Protein, ur 08/15/2018 NEGATIVE  NEGATIVE mg/dL Final   Performed at [facility], [place] [address] [place], [place] [address]    Physical Examination:  LMP  (LMP Unknown)   Exam not done as patient is remote    ASSESSMENT:  Diabetes type 2  See history of present illness for detailed discussion of current diabetes management, blood sugar patterns and problems identified  Recent A1c indicates overall poor control This appears to be primarily postprandial hyperglycemia not addressed by her GLP-1 drug   Current treatment regimen is only basal insulin and Trulicity With relatively low doses of basal insulin she still has readings as low as 57 waking up indicating excessive basal insulin She does have variability in her diet which may account for inconsistent reading Also most of her readings are down only before noon some time and not clear what her blood sugars are after lunch and dinner Diet can be improved as discussed above She is not able to exercise at all  Complications of diabetes: Reportedly has retinopathy, currently without a foot exam not clear if she has neuropathy.  No history of microalbuminuria  Leg weakness: She will discuss with PCP, not clear if she has neuropathy  RENAL dysfunction of  unclear etiology  PLAN:    1. Glucose monitoring: She will continue to use her One Touch meter but needs to bring this and to make sure it is accurate . Patient advised to check readings either fasting or 2 hours after meals on a regular basis and not just in the mornings  2.  Diabetes education: . Patient will need formal meal planning and consultation will be arranged with dietitian . May also need consultation with CDE for evaluation of her insulin regimen  3.  Lifestyle changes: . Dietary changes:She will need to stop drinking grape juice, may also need to have a more balanced breakfast instead of cereal  4.  Medication changes needed: . Start Humalog 3 units before every meal  . today discussed  the need for mealtime insulin to cover postprandial spikes, action of mealtime insulin, use of the insulin pen, timing and action of the rapid acting insulin as well as initial dose and possible dosage titration to target the two-hour reading of under 180 . Reduce Tresiba down to at least 18 to avoid tendency to overnight hypoglycemia  5.  Preventive care needed:  . Prevnar  6.  Follow-up: 3 weeks for reassessment    There are no Patient Instructions on file for this visit.    Consultation note has been sent to the referring physician  Duration of telephone encounter today =28 minutes  Toniesha Zellner 08/31/2018, 1:04 PM   Note: This office note was prepared with Dragon voice recognition system technology. Any transcriptional errors that result from this process are unintentional.

## 2018-08-31 NOTE — Telephone Encounter (Signed)
Echo results given.  Orders placed.  Sent procedures to pre cert for review.    Call placed to Pt.  Advised of tests ordered.    Advised Pt I would send her some information on these tests.

## 2018-08-31 NOTE — Telephone Encounter (Signed)
-----   Message from [name] Ill, RN sent at 08/26/2018  5:13 PM EDT -----  ----- Message ----- From: [name], MD Sent: 08/26/2018   4:21 PM EDT To: [name] Ill, RN, Delice Bison, DO  Please let patient now that echo showed normal LVF with mildly thickened heart muscle and increased stiffness of heart muscle, mildly thickened MV leaflets and AV leaflets.  Still with moderate pulmonary HTN with PASP 14mmHg.

## 2018-08-31 NOTE — Telephone Encounter (Signed)
Increase up to 6 units as directed

## 2018-09-05 ENCOUNTER — Telehealth: Payer: Self-pay | Admitting: *Deleted

## 2018-09-05 DIAGNOSIS — I10 Essential (primary) hypertension: Secondary | ICD-10-CM

## 2018-09-05 NOTE — Telephone Encounter (Signed)
-----   Message from Lauralee Evener, [place] sent at 08/31/2018  2:50 PM EDT ----- Regarding: RE: orders by Dr. Radford Pax Per [facility] web portal no PA is required. Ok to schedule sleep study. ----- Message ----- From: Damian Leavell, RN Sent: 08/31/2018   2:03 PM EDT To: [facility] Pre Cert/Auth, # Subject: orders by Dr. Radford Pax                           Dr. Radford Pax ordered: sleep study, VQ scan and PFT for pulmonary hypertension

## 2018-09-05 NOTE — Telephone Encounter (Signed)
Patient is scheduled for lab study on 09/18/18. Patient understands her sleep study will be done at [facility] sleep lab. Patient understands she will receive a sleep packet in a week or so. Patient understands to call if she does not receive the sleep packet in a timely manner. She is scheduled for COVID screening on 09/15/18 prior to SS.   Patient agrees with treatment and thanked me for call.

## 2018-09-06 NOTE — Addendum Note (Signed)
Addended by: Freada Bergeron on: 09/06/2018 02:52 PM   Modules accepted: Orders

## 2018-09-07 ENCOUNTER — Telehealth: Payer: Self-pay | Admitting: *Deleted

## 2018-09-07 DIAGNOSIS — I10 Essential (primary) hypertension: Secondary | ICD-10-CM

## 2018-09-07 NOTE — Telephone Encounter (Signed)
-----   Message from [name], MD sent at 09/06/2018  8:25 AM EDT ----- Regarding: RE: Sleep Study Home sleep study is fine Traci ----- Message ----- From: Freada Bergeron, CMA Sent: 09/05/2018   3:35 PM EDT To: [name], MD Subject: Sleep Study                                    Patient states she cant' sleep at night without her husband so we may need to a Home sleep study.  She kept asking if he could come with her.  Thanks, Gae Bon

## 2018-09-07 NOTE — Telephone Encounter (Signed)
Informed patient of upcoming home sleep study and patient understanding was verbalized. Patient is aware and agreeable to Home Sleep Study through [facility]. Patient is scheduled for 11/16/18 at 9 am to pick up home sleep kit and meet with Respiratory therapist at [facility]. Patient is aware that if this appointment date and time does not work for them they should contact Artis Delay directly at [phone]. Patient is aware that a sleep packet will be sent from [facility] in week. Patient is agreeable to treatment and thankful for call.

## 2018-09-07 NOTE — Addendum Note (Signed)
Addended by: Freada Bergeron on: 09/07/2018 01:20 PM   Modules accepted: Orders

## 2018-09-07 NOTE — Telephone Encounter (Signed)
Per Barry Brunner (verbal consent) for Home sleep test to [facility] per [facility] web portal no PA is required for sleep study.

## 2018-09-08 NOTE — Progress Notes (Signed)
[place] [facility]   [phone] Fax:(336) [phone]   Clinic Follow up Note   Patient Care Team: Delice Bison, DO as PCP - General [name], MD as PCP - Cardiology (Cardiology)  Date of Service:  09/12/2018  CHIEF COMPLAINT: F/u ofAnemia due to chronic blood loss, HHT  HEMATOLOGY HISTORY 1.Hereditary hemorrhagic telangiectasia (HHT),with epistaxis andfrequentGI bleeding. Previously under care of Dr. [name] 2.Anemia secondary to GI bleeding and iron deficiency, required blood transfusion every 3 to 4 weeks, IV Feraheme every 1 to 2 months  CURRENT THERAPY 1. Iv feraheme as needed if ferritin<50 2.Avastin5mg /kg every 2 weeks, starting3/30/20 3. Blood transfusion if Hg<8.0  INTERVAL HISTORY:  [name] is here for a follow up anemia, HHT. She presents to the clinic alone. She notes having pain through out her whole legs. This can happen when she sits or stands or walks. She notes elevating her legs on pillows eases the pain. She denies neuropathy. She feels this has bene going on for a while. She has not taken any medication for this. She has not seen anyone about this. She notes this pain effects her sleeping. She notes she has not been able to walk. She gets around by wheelchair mostly now or her cane/walking stick.  She does not remember who her neurologist was when she had a seizure in 2016. She has been on Keppra since then.  Her M are manageable with stool softener. She denies GI bleeding or black stool. She denies abdominal issues. She notes her appetite is poor, but still eats when needed. This is overall stable for her.     REVIEW OF SYSTEMS:   Constitutional: Denies fevers, chills or abnormal weight loss (+) Stable poor appetite, adequate eating (+) trouble sleeping.  Eyes: Denies blurriness of vision Ears, nose, mouth, throat, and face: Denies mucositis or sore throat Respiratory: Denies cough, dyspnea or  wheezes Cardiovascular: Denies palpitation, chest discomfort or lower extremity swelling Gastrointestinal:  Denies nausea, heartburn or change in bowel habits Skin: Denies abnormal skin rashes MSK: (+) Significant leg pain, difficulty ambulating.   Lymphatics: Denies new lymphadenopathy or easy bruising Neurological:Denies numbness, tingling or new weaknesses Behavioral/Psych: Mood is stable, no new changes  All other systems were reviewed with the patient and are negative.  MEDICAL HISTORY:  Past Medical History:  Diagnosis Date  . Chronic anemia   . Chronic diastolic CHF (congestive heart failure) ([place]) 10/03/2013  . Chronic GI bleeding    [name] 11/29/2014  . Family history of anesthesia complication    "niece has a hard time coming out" (09/15/2012)  . Frequent nosebleeds    chronic  . Gastric AV malformation    [name] 11/29/2014  . GERD (gastroesophageal reflux disease)   . 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 History of blood transfusion "several"  . History of epistaxis   . HTN (hypertension), benign 03/02/2012  . Hyperlipidemia   . Iron deficiency anemia    chronic infusions"  . Lichen planus    Both lower extremities  . 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 Overgrown toenails 03/18/2017  . Pneumonia 1990's X 2  . Pulmonary HTN ([place]) 04/01/2017   PASP 73mmHg on echo 09/2016 and 59mmHg by echo 2019  . Seizures ([place]) 09/2014  . Symptomatic anemia 11/29/2014  . Telangiectasia    Gastric   . 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    SURGICAL HISTORY:  Past Surgical History:  Procedure Laterality Date  . CATARACT EXTRACTION     "I think it was just one eye"  . ESOPHAGOGASTRODUODENOSCOPY  02/26/2011   Procedure: ESOPHAGOGASTRODUODENOSCOPY (EGD);  Surgeon: [name], MD;  Location: [name];  Service: Endoscopy;  Laterality: N/A;  . ESOPHAGOGASTRODUODENOSCOPY N/A 11/08/2012   Procedure:  ESOPHAGOGASTRODUODENOSCOPY (EGD);  Surgeon: [name], MD;  Location: [name];  Service: Endoscopy;  Laterality: N/A;  . ESOPHAGOGASTRODUODENOSCOPY N/A 10/04/2013   Procedure: ESOPHAGOGASTRODUODENOSCOPY (EGD);  Surgeon: Winfield Cunas., MD;  Location: [name];  Service: Endoscopy;  Laterality: N/A;  with APC on stand-by  . ESOPHAGOGASTRODUODENOSCOPY N/A 07/06/2014   Procedure: ESOPHAGOGASTRODUODENOSCOPY (EGD);  Surgeon: Clarene Essex, MD;  Location: [name];  Service: Endoscopy;  Laterality: N/A;  . ESOPHAGOGASTRODUODENOSCOPY N/A 09/05/2014   Procedure: ESOPHAGOGASTRODUODENOSCOPY (EGD);  Surgeon: Laurence Spates, MD;  Location: [name];  Service: Endoscopy;  Laterality: N/A;  APC on standby to control bleeding  . ESOPHAGOGASTRODUODENOSCOPY N/A 11/29/2014   Procedure: ESOPHAGOGASTRODUODENOSCOPY (EGD);  Surgeon: [name], MD;  Location: [facility] ENDOSCOPY;  Service: Endoscopy;  Laterality: N/A;  . ESOPHAGOGASTRODUODENOSCOPY N/A 09/28/2015   Procedure: ESOPHAGOGASTRODUODENOSCOPY (EGD);  Surgeon: Clarene Essex, MD;  Location: [facility] ENDOSCOPY;  Service: Endoscopy;  Laterality: N/A;  . ESOPHAGOGASTRODUODENOSCOPY (EGD) WITH PROPOFOL N/A 12/04/2017   Procedure: ESOPHAGOGASTRODUODENOSCOPY (EGD) WITH PROPOFOL;  Surgeon: [name], MD;  Location: [facility];  Service: Endoscopy;  Laterality: N/A;  . ESOPHAGOGASTRODUODENOSCOPY ENDOSCOPY  08/19/2006   with laser treatment  . HOT HEMOSTASIS N/A 07/06/2014   Procedure: HOT HEMOSTASIS (ARGON PLASMA COAGULATION/BICAP);  Surgeon: Clarene Essex, MD;  Location: [name];  Service: Endoscopy;  Laterality: N/A;  . HOT HEMOSTASIS N/A 09/28/2015   Procedure: HOT HEMOSTASIS (ARGON PLASMA COAGULATION/BICAP);  Surgeon: Clarene Essex, MD;  Location: [facility] ENDOSCOPY;  Service: Endoscopy;  Laterality: N/A;  . HOT HEMOSTASIS N/A 12/04/2017   Procedure: HOT HEMOSTASIS (ARGON PLASMA COAGULATION/BICAP);  Surgeon: [name], MD;  Location: [place];   Service: Endoscopy;  Laterality: N/A;  . NASAL HEMORRHAGE CONTROL     "for bleeding"   . SAVORY DILATION  02/26/2011   Procedure: SAVORY DILATION;  Surgeon: [name], MD;  Location: [name];  Service: Endoscopy;  Laterality: N/A;  c-arm needed  . SUBMUCOSAL INJECTION  12/04/2017   Procedure: SUBMUCOSAL INJECTION;  Surgeon: [name], MD;  Location: [facility] ENDOSCOPY;  Service: Endoscopy;;    I have reviewed the social history and family history with the patient and they are unchanged from previous note.  ALLERGIES:  is allergic to aspirin.  MEDICATIONS:  Current Outpatient Medications  Medication Sig Dispense Refill  . augmented betamethasone dipropionate (DIPROLENE-AF) 0.05 % cream Apply 1 application topically daily.  2  . b complex vitamins capsule Take 1 capsule by mouth daily. 100 capsule 2  . cetaphil (CETAPHIL) lotion Apply 1 application topically as needed for dry skin. 236 mL 0  . donepezil (ARICEPT) 10 MG tablet Take 1 tablet (10 mg total) by mouth at bedtime. 90 tablet 4  . Dulaglutide (TRULICITY) 1.5 DJ/2.4QA SOPN Inject 1.5 mg into the skin once a week. Every Tuesday 4 mL 1  . Elastic Bandages & Supports (MEDICAL COMPRESSION STOCKINGS) MISC Wear as much as possible while awake to reduce swelling 2 each 0  . FERROCITE 324 MG TABS tablet TAKE 1 TABLET BY MOUTH TWICE DAILY 180 tablet 3  . furosemide (LASIX) 20 MG tablet Take 1 tablet (20 mg total) by mouth daily. 90 tablet 3  . glucose blood (  ONETOUCH VERIO) test strip Use as instructed 4 times daily. E11.29, insulin requiring 900 each 3  . insulin degludec (TRESIBA FLEXTOUCH) 100 UNIT/ML SOPN FlexTouch Pen Inject 0.2 mLs (20 Units total) into the skin every morning. (Patient taking differently: Inject 21 Units into the skin every evening. ) 6 pen 3  . insulin lispro (HUMALOG KWIKPEN) 100 UNIT/ML KwikPen Inject 3 units under the skin three times daily before each meal. May increase up to 6 units before meals if needed.  15 mL 1  . Insulin Pen Needle (B-D UF III MINI PEN NEEDLES) 31G X 5 MM MISC USE TO INJECT INSULIN  3 TIMES DAILY 100 each 0  . lactulose (CHRONULAC) 10 GM/15ML solution TAKE 15 ML BY MOUTH TWO TIMES DAILY 473 mL 2  . levETIRAcetam (KEPPRA) 500 MG tablet Take 1 tablet (500 mg total) by mouth 2 (two) times daily. 180 tablet 4  . memantine (NAMENDA) 10 MG tablet Take 1 tablet (10 mg total) by mouth 2 (two) times daily. 180 tablet 4  . ONETOUCH DELICA LANCETS FINE MISC Check blood sugar up to 4 times a day 300 each 4  . oxycodone-acetaminophen (PERCOCET) 2.5-325 MG tablet Take 1-2 tablets by mouth at bedtime as needed for pain. 30 tablet 0  . oxymetazoline (AFRIN) 0.05 % nasal spray Place 1 spray into both nostrils 2 (two) times daily as needed (Epistaxis). 30 mL 0  . pantoprazole (PROTONIX) 40 MG tablet Take 1 tablet by mouth once daily 180 tablet 0  . potassium chloride (K-DUR) 10 MEQ tablet Take 1 tablet (10 mEq total) by mouth daily. 90 tablet 3  . sodium chloride (OCEAN) 0.65 % SOLN nasal spray Place 1 spray into both nostrils as needed for congestion. 60 mL 0  . traMADol (ULTRAM) 50 MG tablet Take 1 tablet (50 mg total) by mouth every 12 (twelve) hours as needed for moderate pain. 14 tablet 0  . triamcinolone ointment (KENALOG) 0.5 % APPLY 1 APPLICATION TOPICALLY TO RASH TWICE DAILY FOR ITCHING 15 g 1  . calcium citrate-vitamin D (CITRACAL+D) 315-200 MG-UNIT tablet Take 1 tablet by mouth 2 (two) times daily. 100 tablet 0   No current facility-administered medications for this visit.    Facility-Administered Medications Ordered in Other Visits  Medication Dose Route Frequency Provider Last Rate Last Dose  . ferumoxytol (FERAHEME) 510 mg in sodium chloride 0.9 % 100 mL IVPB  510 mg Intravenous Once Truitt Merle, MD      . heparin lock flush 100 unit/mL  500 Units Intracatheter Once PRN Truitt Merle, MD      . sodium chloride flush (NS) 0.9 % injection 10 mL  10 mL Intracatheter PRN Truitt Merle, MD         PHYSICAL EXAMINATION: ECOG PERFORMANCE STATUS: 3 - Symptomatic, >50% confined to bed  Vitals:   09/12/18 0858  BP: (!) 165/45  Pulse: 63  Resp: 17  Temp: 98.6 F (37 C)  SpO2: 100%   Filed Weights   09/12/18 0858  Weight: 147 lb 1.6 oz (66.7 kg)    GENERAL:alert, no distress and comfortable SKIN: skin color, texture, turgor are normal, no rashes or significant lesions EYES: normal, Conjunctiva are pink and non-injected, sclera clear  NECK: supple, thyroid normal size, non-tender, without nodularity LYMPH:  no palpable lymphadenopathy in the cervical, axillary  LUNGS: clear to auscultation and percussion with normal breathing effort HEART: regular rate & rhythm and no murmurs and no lower extremity edema ABDOMEN:abdomen soft, non-tender and normal bowel  sounds Musculoskeletal:no cyanosis of digits and no clubbing  NEURO: alert & oriented x 3 with fluent speech, no focal motor/sensory deficits  LABORATORY DATA:  I have reviewed the data as listed CBC Latest Ref Rng & Units 09/12/2018 08/15/2018 08/01/2018  WBC 4.0 - 10.5 K/uL 5.3 5.8 7.1  Hemoglobin 12.0 - 15.0 g/dL 9.5(L) 12.2 12.4  Hematocrit 36.0 - 46.0 % 29.0(L) 35.1(L) 37.2  Platelets 150 - 400 K/uL 154 149(L) 133(L)     CMP Latest Ref Rng & Units 07/18/2018 06/20/2018 05/23/2018  Glucose 70 - 99 mg/dL 294(H) 343(H) 316(H)  BUN 8 - 23 mg/dL 24(H) 24(H) 19  Creatinine 0.44 - 1.00 mg/dL 1.62(H) 1.57(H) 1.44(H)  Sodium 135 - 145 mmol/L 142 143 141  Potassium 3.5 - 5.1 mmol/L 4.4 4.4 4.3  Chloride 98 - 111 mmol/L 106 108 104  CO2 22 - 32 mmol/L 24 24 27   Calcium 8.9 - 10.3 mg/dL 9.8 10.3 9.4  Total Protein 6.5 - 8.1 g/dL 6.6 7.0 6.3(L)  Total Bilirubin 0.3 - 1.2 mg/dL 0.4 0.4 0.3  Alkaline Phos 38 - 126 U/L 116 119 117  AST 15 - 41 U/L 37 22 27  ALT 0 - 44 U/L 32 23 22      RADIOGRAPHIC STUDIES: I have personally reviewed the radiological images as listed and agreed with the findings in the report. No results  found.   ASSESSMENT & PLAN:  [name] is a 78 y.o. female with   1.Hereditary hemorrhagic telangiectasia(HHT)), withepistaxis and frequent GI bleeding -She has mild epistaxis, and frequent GI bleeding, has required blood transfusion every 3 to 4 weeks. Her last blood transfusion was on 04/13/2018 -She started Avastin for q2weeks on 05/09/18.She is tolerating well and responding well.  -Labs reviewed, CBC WNL except HG 9.5, lymphocytes ct 0.6. Urine protein negative. Ferritin pending. Will proceed with Avastin today, given her anemia, will give her IV iron today -No need for blood transfusion.  -given her recurrent anemia, will change Avastin back to every 2 weeks  - continue IV Feraheme if ferritin<50 -F/u in 2 months   2. Fatigue and anorexia  -she developed significantfatigue and anorexia oneday after Avastin infusion. -Hersymptoms are possible related to Avastin infusion, or hyperglycemia, will continue monitoring. -She has responded well to Avastin. -Hg at 9.5 today (09/12/18)  3. 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   4.Iron deficiency anemia secondary to #2  -She does not tolerate oral ironwell but is on it -will continueIV Feraheme if ferritin less than 50 -Last IV Feraheme 08/15/18  5.Memory Loss  -She takes Ariceptand memantine -f/u with neurology. She has not been seen in some time. 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   6. 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 [name] previously prescribed percocet, I do not plan to refill and have not.  -She takes  tramadolalso -She continues to have stable leg pain that effects her walking. I discussed her pain is likely mainly caused by her uncontrolled DM.  -I encouraged her to follow up with her PCP soon and more frequently. -Lately she has been having b/l diffuse leg pain. She still  has not seen her PCP for this. She is not on any medication for the pain and now it is very difficult for her to ambulate. She mainly gets around by wheelchair.   7. DM,CHF and CKD stage III -She is on insulin, DM not well controlled  -She monitors her blood glucose at home -CKD and CHF stable clinically   Plan -I called her husband, I recommend she f/u with her PCP soon for her  -Labs reviewed, Hg 9.5, will proceed with Avastin and Feraheme today -Lab and Avastin every 2 weeks, Feraheme if ferritin<50  -F/u in 2 months    No problem-specific Assessment & Plan notes found for this encounter.   No orders of the defined types were placed in this encounter.  All questions were answered. The patient knows to call the clinic with any problems, questions or concerns. No barriers to learning was detected. I spent 20 minutes counseling the patient face to face. The total time spent in the appointment was 25 minutes and more than 50% was on counseling and review of test results     Truitt Merle, MD 09/12/2018   I, Joslyn Devon, am acting as scribe for Truitt Merle, MD.   I have reviewed the above documentation for accuracy and completeness, and I agree with the above.

## 2018-09-09 ENCOUNTER — Other Ambulatory Visit: Payer: Self-pay | Admitting: Internal Medicine

## 2018-09-09 DIAGNOSIS — E1165 Type 2 diabetes mellitus with hyperglycemia: Secondary | ICD-10-CM

## 2018-09-12 ENCOUNTER — Telehealth: Payer: Self-pay | Admitting: Hematology

## 2018-09-12 ENCOUNTER — Inpatient Hospital Stay: Payer: Medicare Other | Attending: Hematology

## 2018-09-12 ENCOUNTER — Inpatient Hospital Stay: Payer: Medicare Other

## 2018-09-12 ENCOUNTER — Encounter: Payer: Self-pay | Admitting: Hematology

## 2018-09-12 ENCOUNTER — Inpatient Hospital Stay (HOSPITAL_BASED_OUTPATIENT_CLINIC_OR_DEPARTMENT_OTHER): Payer: Medicare Other | Admitting: Hematology

## 2018-09-12 ENCOUNTER — Other Ambulatory Visit: Payer: Self-pay

## 2018-09-12 VITALS — BP 165/45 | HR 63 | Temp 98.6°F | Resp 17 | Ht 63.0 in | Wt 147.1 lb

## 2018-09-12 VITALS — BP 152/52 | HR 59 | Temp 98.4°F | Resp 16

## 2018-09-12 DIAGNOSIS — E1129 Type 2 diabetes mellitus with other diabetic kidney complication: Secondary | ICD-10-CM | POA: Diagnosis not present

## 2018-09-12 DIAGNOSIS — I780 Hereditary hemorrhagic telangiectasia: Secondary | ICD-10-CM

## 2018-09-12 DIAGNOSIS — K31811 Angiodysplasia of stomach and duodenum with bleeding: Secondary | ICD-10-CM

## 2018-09-12 DIAGNOSIS — E1165 Type 2 diabetes mellitus with hyperglycemia: Secondary | ICD-10-CM | POA: Diagnosis not present

## 2018-09-12 DIAGNOSIS — I480 Paroxysmal atrial fibrillation: Secondary | ICD-10-CM

## 2018-09-12 DIAGNOSIS — K922 Gastrointestinal hemorrhage, unspecified: Secondary | ICD-10-CM | POA: Diagnosis not present

## 2018-09-12 DIAGNOSIS — D500 Iron deficiency anemia secondary to blood loss (chronic): Secondary | ICD-10-CM

## 2018-09-12 DIAGNOSIS — E1122 Type 2 diabetes mellitus with diabetic chronic kidney disease: Secondary | ICD-10-CM | POA: Insufficient documentation

## 2018-09-12 DIAGNOSIS — Z794 Long term (current) use of insulin: Secondary | ICD-10-CM | POA: Insufficient documentation

## 2018-09-12 DIAGNOSIS — N183 Chronic kidney disease, stage 3 (moderate): Secondary | ICD-10-CM | POA: Insufficient documentation

## 2018-09-12 DIAGNOSIS — R5383 Other fatigue: Secondary | ICD-10-CM | POA: Insufficient documentation

## 2018-09-12 DIAGNOSIS — I130 Hypertensive heart and chronic kidney disease with heart failure and stage 1 through stage 4 chronic kidney disease, or unspecified chronic kidney disease: Secondary | ICD-10-CM | POA: Insufficient documentation

## 2018-09-12 DIAGNOSIS — M79605 Pain in left leg: Secondary | ICD-10-CM | POA: Insufficient documentation

## 2018-09-12 DIAGNOSIS — R040 Epistaxis: Secondary | ICD-10-CM | POA: Insufficient documentation

## 2018-09-12 DIAGNOSIS — D509 Iron deficiency anemia, unspecified: Secondary | ICD-10-CM

## 2018-09-12 DIAGNOSIS — K31819 Angiodysplasia of stomach and duodenum without bleeding: Secondary | ICD-10-CM

## 2018-09-12 DIAGNOSIS — I5032 Chronic diastolic (congestive) heart failure: Secondary | ICD-10-CM | POA: Diagnosis not present

## 2018-09-12 DIAGNOSIS — I272 Pulmonary hypertension, unspecified: Secondary | ICD-10-CM | POA: Diagnosis not present

## 2018-09-12 DIAGNOSIS — R413 Other amnesia: Secondary | ICD-10-CM | POA: Diagnosis not present

## 2018-09-12 DIAGNOSIS — M542 Cervicalgia: Secondary | ICD-10-CM | POA: Insufficient documentation

## 2018-09-12 DIAGNOSIS — E785 Hyperlipidemia, unspecified: Secondary | ICD-10-CM | POA: Insufficient documentation

## 2018-09-12 DIAGNOSIS — IMO0002 Reserved for concepts with insufficient information to code with codable children: Secondary | ICD-10-CM

## 2018-09-12 DIAGNOSIS — K219 Gastro-esophageal reflux disease without esophagitis: Secondary | ICD-10-CM | POA: Insufficient documentation

## 2018-09-12 DIAGNOSIS — M79604 Pain in right leg: Secondary | ICD-10-CM | POA: Diagnosis not present

## 2018-09-12 DIAGNOSIS — G4709 Other insomnia: Secondary | ICD-10-CM | POA: Diagnosis not present

## 2018-09-12 DIAGNOSIS — Z79899 Other long term (current) drug therapy: Secondary | ICD-10-CM | POA: Diagnosis not present

## 2018-09-12 DIAGNOSIS — Z5112 Encounter for antineoplastic immunotherapy: Secondary | ICD-10-CM | POA: Diagnosis present

## 2018-09-12 DIAGNOSIS — Z95828 Presence of other vascular implants and grafts: Secondary | ICD-10-CM

## 2018-09-12 LAB — CBC WITH DIFFERENTIAL (CANCER CENTER ONLY)
Abs Immature Granulocytes: 0.01 10*3/uL (ref 0.00–0.07)
Basophils Absolute: 0 10*3/uL (ref 0.0–0.1)
Basophils Relative: 1 %
Eosinophils Absolute: 0.3 10*3/uL (ref 0.0–0.5)
Eosinophils Relative: 6 %
HCT: 29 % — ABNORMAL LOW (ref 36.0–46.0)
Hemoglobin: 9.5 g/dL — ABNORMAL LOW (ref 12.0–15.0)
Immature Granulocytes: 0 %
Lymphocytes Relative: 11 %
Lymphs Abs: 0.6 10*3/uL — ABNORMAL LOW (ref 0.7–4.0)
MCH: 31.5 pg (ref 26.0–34.0)
MCHC: 32.8 g/dL (ref 30.0–36.0)
MCV: 96 fL (ref 80.0–100.0)
Monocytes Absolute: 0.6 10*3/uL (ref 0.1–1.0)
Monocytes Relative: 11 %
Neutro Abs: 3.8 10*3/uL (ref 1.7–7.7)
Neutrophils Relative %: 71 %
Platelet Count: 154 10*3/uL (ref 150–400)
RBC: 3.02 MIL/uL — ABNORMAL LOW (ref 3.87–5.11)
RDW: 15.7 % — ABNORMAL HIGH (ref 11.5–15.5)
WBC Count: 5.3 10*3/uL (ref 4.0–10.5)
nRBC: 0 % (ref 0.0–0.2)

## 2018-09-12 LAB — TOTAL PROTEIN, URINE DIPSTICK: Protein, ur: NEGATIVE mg/dL

## 2018-09-12 LAB — FERRITIN: Ferritin: 92 ng/mL (ref 11–307)

## 2018-09-12 MED ORDER — SODIUM CHLORIDE 0.9 % IV SOLN
Freq: Once | INTRAVENOUS | Status: AC
  Administered 2018-09-12: 10:00:00 via INTRAVENOUS
  Filled 2018-09-12: qty 250

## 2018-09-12 MED ORDER — SODIUM CHLORIDE 0.9% FLUSH
10.0000 mL | INTRAVENOUS | Status: DC | PRN
  Administered 2018-09-12: 12:00:00 10 mL
  Filled 2018-09-12: qty 10

## 2018-09-12 MED ORDER — SODIUM CHLORIDE 0.9 % IV SOLN
4.5000 mg/kg | Freq: Once | INTRAVENOUS | Status: AC
  Administered 2018-09-12: 10:00:00 300 mg via INTRAVENOUS
  Filled 2018-09-12: qty 12

## 2018-09-12 MED ORDER — SODIUM CHLORIDE 0.9% FLUSH
10.0000 mL | INTRAVENOUS | Status: DC | PRN
  Administered 2018-09-12: 09:00:00 10 mL
  Filled 2018-09-12: qty 10

## 2018-09-12 MED ORDER — HEPARIN SOD (PORK) LOCK FLUSH 100 UNIT/ML IV SOLN
500.0000 [IU] | Freq: Once | INTRAVENOUS | Status: AC | PRN
  Administered 2018-09-12: 12:00:00 500 [IU]
  Filled 2018-09-12: qty 5

## 2018-09-12 MED ORDER — SODIUM CHLORIDE 0.9 % IV SOLN
510.0000 mg | Freq: Once | INTRAVENOUS | Status: AC
  Administered 2018-09-12: 510 mg via INTRAVENOUS
  Filled 2018-09-12: qty 17

## 2018-09-12 NOTE — Patient Instructions (Signed)
[place] Discharge Instructions for Patients Receiving Chemotherapy  Today you received the following chemotherapy agents: Avastin.  To help prevent nausea and vomiting after your treatment, we encourage you to take your nausea medication as directed.   If you develop nausea and vomiting that is not controlled by your nausea medication, call the clinic.   BELOW ARE SYMPTOMS THAT SHOULD BE REPORTED IMMEDIATELY:  *FEVER GREATER THAN 100.5 F  *CHILLS WITH OR WITHOUT FEVER  NAUSEA AND VOMITING THAT IS NOT CONTROLLED WITH YOUR NAUSEA MEDICATION  *UNUSUAL SHORTNESS OF BREATH  *UNUSUAL BRUISING OR BLEEDING  TENDERNESS IN MOUTH AND THROAT WITH OR WITHOUT PRESENCE OF ULCERS  *URINARY PROBLEMS  *BOWEL PROBLEMS  UNUSUAL RASH Items with * indicate a potential emergency and should be followed up as soon as possible.  Feel free to call the clinic should you have any questions or concerns. The clinic phone number is [phone].  Please show the [place] at check-in to the Emergency Department and triage nurse.  Ferumoxytol injection (IV Iron) What is this medicine? FERUMOXYTOL is an iron complex. Iron is used to make healthy red blood cells, which carry oxygen and nutrients throughout the body. This medicine is used to treat iron deficiency anemia. This medicine may be used for other purposes; ask your health care provider or pharmacist if you have questions. COMMON BRAND NAME(S): Feraheme What should I tell my health care provider before I take this medicine? They need to know if you have any of these conditions:  anemia not caused by low iron levels  high levels of iron in the blood  magnetic resonance imaging (MRI) test scheduled  an unusual or allergic reaction to iron, other medicines, foods, dyes, or preservatives  pregnant or trying to get pregnant  breast-feeding How should I use this medicine? This medicine is for injection into a vein. It  is given by a health care professional in a hospital or clinic setting. Talk to your pediatrician regarding the use of this medicine in children. Special care may be needed. Overdosage: If you think you have taken too much of this medicine contact a poison control center or emergency room at once. NOTE: This medicine is only for you. Do not share this medicine with others. What if I miss a dose? It is important not to miss your dose. Call your doctor or health care professional if you are unable to keep an appointment. What may interact with this medicine? This medicine may interact with the following medications:  other iron products This list may not describe all possible interactions. Give your health care provider a list of all the medicines, herbs, non-prescription drugs, or dietary supplements you use. Also tell them if you smoke, drink alcohol, or use illegal drugs. Some items may interact with your medicine. What should I watch for while using this medicine? Visit your doctor or healthcare professional regularly. Tell your doctor or healthcare professional if your symptoms do not start to get better or if they get worse. You may need blood work done while you are taking this medicine. You may need to follow a special diet. Talk to your doctor. Foods that contain iron include: whole grains/cereals, dried fruits, beans, or peas, leafy green vegetables, and organ meats (liver, kidney). What side effects may I notice from receiving this medicine? Side effects that you should report to your doctor or health care professional as soon as possible:  allergic reactions like skin rash, itching or hives,  swelling of the face, lips, or tongue  breathing problems  changes in blood pressure  feeling faint or lightheaded, falls  fever or chills  flushing, sweating, or hot feelings  swelling of the ankles or feet Side effects that usually do not require medical attention (report to your doctor  or health care professional if they continue or are bothersome):  diarrhea  headache  nausea, vomiting  stomach pain This list may not describe all possible side effects. Call your doctor for medical advice about side effects. You may report side effects to FDA at 1-800-FDA-1088. Where should I keep my medicine? This drug is given in a hospital or clinic and will not be stored at home. NOTE: This sheet is a summary. It may not cover all possible information. If you have questions about this medicine, talk to your doctor, pharmacist, or health care provider.  2020 Elsevier/Gold Standard (2016-03-16 20:21:10)

## 2018-09-12 NOTE — Telephone Encounter (Signed)
Scheduled appt per 8/3 los.  Patient will get print out from her infusion nurse.

## 2018-09-12 NOTE — Progress Notes (Signed)
Per lab, ferritin will not be run until after noon today at the earliest (09/12/18). Per Dr. Burr Medico, give feraheme today.

## 2018-09-13 ENCOUNTER — Other Ambulatory Visit: Payer: Self-pay | Admitting: Student in an Organized Health Care Education/Training Program

## 2018-09-13 ENCOUNTER — Telehealth: Payer: Self-pay | Admitting: *Deleted

## 2018-09-13 ENCOUNTER — Telehealth: Payer: Self-pay | Admitting: Internal Medicine

## 2018-09-13 DIAGNOSIS — Z794 Long term (current) use of insulin: Secondary | ICD-10-CM

## 2018-09-13 DIAGNOSIS — I5032 Chronic diastolic (congestive) heart failure: Secondary | ICD-10-CM

## 2018-09-13 DIAGNOSIS — I272 Pulmonary hypertension, unspecified: Secondary | ICD-10-CM

## 2018-09-13 DIAGNOSIS — E1165 Type 2 diabetes mellitus with hyperglycemia: Secondary | ICD-10-CM

## 2018-09-13 NOTE — Telephone Encounter (Signed)
Pt is requesting a nurse to call back  [phone] Pt is having leg pain; pt wants to know is there something she can take

## 2018-09-13 NOTE — Addendum Note (Signed)
Addended by: Fransico Him R on: 09/13/2018 01:50 PM   Modules accepted: Orders

## 2018-09-13 NOTE — Telephone Encounter (Signed)
Diagnosis  I27.20 (ICD-10-CM) - Pulmonary HTN ([place])  Referral Notes Number of Notes: 1 . Type Date User Summary  General 09/13/2018 11:33 AM March Rumm[place]: Referral message  Attachment: -  Note   ----- Message ----- From: Earnestine Mealing Sent: 09/13/2018  11:32 AM EDT To: [facility] Triage Subject: [place] PULMONARY VENT AND PERF                     I called to schedule this test.  Per the scheduler, pt will need to have an chest xray prior to test.  Please put in order for chest xray.   Thanks         Ordered CXR as necessary prior to VQ scan.

## 2018-09-15 ENCOUNTER — Other Ambulatory Visit: Payer: Self-pay

## 2018-09-15 ENCOUNTER — Other Ambulatory Visit (HOSPITAL_COMMUNITY): Payer: Medicare Other

## 2018-09-15 DIAGNOSIS — Z20822 Contact with and (suspected) exposure to covid-19: Secondary | ICD-10-CM

## 2018-09-16 ENCOUNTER — Telehealth (INDEPENDENT_AMBULATORY_CARE_PROVIDER_SITE_OTHER): Payer: Medicare Other | Admitting: Internal Medicine

## 2018-09-16 ENCOUNTER — Other Ambulatory Visit: Payer: Self-pay

## 2018-09-16 ENCOUNTER — Ambulatory Visit: Payer: Medicare Other | Admitting: Internal Medicine

## 2018-09-16 ENCOUNTER — Other Ambulatory Visit: Payer: Self-pay | Admitting: Internal Medicine

## 2018-09-16 ENCOUNTER — Telehealth: Payer: Self-pay | Admitting: Internal Medicine

## 2018-09-16 ENCOUNTER — Telehealth: Payer: Self-pay | Admitting: *Deleted

## 2018-09-16 DIAGNOSIS — M545 Low back pain: Secondary | ICD-10-CM

## 2018-09-16 DIAGNOSIS — G8929 Other chronic pain: Secondary | ICD-10-CM | POA: Diagnosis not present

## 2018-09-16 DIAGNOSIS — M79604 Pain in right leg: Secondary | ICD-10-CM

## 2018-09-16 DIAGNOSIS — M79605 Pain in left leg: Secondary | ICD-10-CM | POA: Diagnosis not present

## 2018-09-16 DIAGNOSIS — M1990 Unspecified osteoarthritis, unspecified site: Secondary | ICD-10-CM

## 2018-09-16 DIAGNOSIS — E1151 Type 2 diabetes mellitus with diabetic peripheral angiopathy without gangrene: Secondary | ICD-10-CM

## 2018-09-16 LAB — SPECIMEN STATUS REPORT

## 2018-09-16 LAB — NOVEL CORONAVIRUS, NAA: SARS-CoV-2, NAA: NOT DETECTED

## 2018-09-16 MED ORDER — OXYCODONE-ACETAMINOPHEN 2.5-325 MG PO TABS: 1.0000 | ORAL_TABLET | Freq: Every evening | ORAL | 0 refills | Status: DC | PRN

## 2018-09-16 NOTE — Progress Notes (Signed)
Please see telemedicine visit note under encounters.

## 2018-09-16 NOTE — Progress Notes (Signed)
xyc

## 2018-09-16 NOTE — Progress Notes (Signed)
Internal Medicine Clinic Attending  Case discussed with Dr. Koleen Distance at the time of the visit .  We reviewed the resident's history and exam and pertinent patient test results.  I agree with the assessment, diagnosis, and plan of care documented in the resident's note.  The patient does have a history of PAD and has been referred to vascular and has been evaluated by vascular.  At her next in-person appointment, arterial function needs assessed to ensure her pain is not ischemic.

## 2018-09-16 NOTE — Progress Notes (Signed)
  [facility] Internal Medicine Residency Telephone Encounter Continuity Care Appointment  HPI:   This telephone encounter was created for [name] on 09/16/2018 for the following purpose/cc leg pain.    Past Medical History:  Past Medical History:  Diagnosis Date  . Chronic anemia   . Chronic diastolic CHF (congestive heart failure) ([place]) 10/03/2013  . Chronic GI bleeding    [name] 11/29/2014  . Family history of anesthesia complication    "niece has a hard time coming out" (09/15/2012)  . Frequent nosebleeds    chronic  . Gastric AV malformation    [name] 11/29/2014  . GERD (gastroesophageal reflux disease)   . 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 History of blood transfusion "several"  . History of epistaxis   . HTN (hypertension), benign 03/02/2012  . Hyperlipidemia   . Iron deficiency anemia    chronic infusions"  . Lichen planus    Both lower extremities  . 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 Overgrown toenails 03/18/2017  . Pneumonia 1990's X 2  . Pulmonary HTN ([place]) 04/01/2017   PASP 12mmHg on echo 09/2016 and 21mmHg by echo 2019  . Seizures ([place]) 09/2014  . Symptomatic anemia 11/29/2014  . Telangiectasia    Gastric   . 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      ROS:   Negative for fevers, chills, numbness/tingling, bowel or bladder incontinence    Assessment / Plan / Recommendations:   Please see A&P under problem oriented charting for assessment of the patient's acute and chronic medical conditions.   As always, pt is advised that if symptoms worsen or new symptoms arise, they should go to an urgent care facility or to to ER for further evaluation.   Consent and Medical Decision Making:   Patient discussed with Dr. Lynnae January  This is a telephone encounter between [place] on 09/16/2018 for leg pain. The visit was conducted with the patient located at home and Delice Bison at [facility]. The patient's identity was confirmed using their DOB and current address. The patient has consented to being evaluated through a telephone encounter and understands the associated risks (an examination cannot be done and the patient may need to come in for an appointment) / benefits (allows the patient to remain at home, decreasing exposure to coronavirus). I personally spent 15 minutes on medical discussion.

## 2018-09-16 NOTE — Telephone Encounter (Signed)
Called pt - talked to her husband. Stated pt is having a lot of leg pain and having difficulty walking. I asked is she taking Oxycodone; he stated no. Explained telehealth appt for this afternoon; he's agreeable. Dr Koleen Distance called and informed.

## 2018-09-16 NOTE — Assessment & Plan Note (Signed)
Patient complains of low back and bilateral leg pain which has been a chronic issue for her. Has had worsening pain the last week. Previously on low-dose Percocet to manage pain. She does have multiple co-morbidities including moderate PAD, type II DM and OA which may all be contributing to her pain. She has become more dependent on her husband to help get her out of bed and has difficulty ambulating due to pain. Denies red flag symptoms including fevers, chills, numbness/tingling, bowel or bladder incontinence.  - Will prescribe short course of low-dose oxycodone-acetaminophen - Patient instructed to call next week for a visit if pain persists.

## 2018-09-16 NOTE — Telephone Encounter (Signed)
Yes that's fine! Thanks, Holley Raring!

## 2018-09-16 NOTE — Telephone Encounter (Signed)
Call from [facility] pharmacist , [place] - stated Oxycodone was written for 15 tabs which will require a prior authorization since she has been on this med within last 3 months but a 7 day supply does not. I gave her verbal order to approve a 7 day supply. Is this ok?

## 2018-09-16 NOTE — Telephone Encounter (Signed)
Yes, that's fine! Thank you, Holley Raring!

## 2018-09-16 NOTE — Telephone Encounter (Signed)
Patient is requesting call back due to having pain in legs.

## 2018-09-18 ENCOUNTER — Encounter (HOSPITAL_BASED_OUTPATIENT_CLINIC_OR_DEPARTMENT_OTHER): Payer: Medicare Other

## 2018-09-19 ENCOUNTER — Encounter: Payer: Self-pay | Admitting: Endocrinology

## 2018-09-19 NOTE — Telephone Encounter (Signed)
See Telehealth Visit on 8/7.

## 2018-09-20 ENCOUNTER — Other Ambulatory Visit: Payer: Self-pay

## 2018-09-20 ENCOUNTER — Ambulatory Visit (HOSPITAL_COMMUNITY): Admission: RE | Admit: 2018-09-20 | Payer: Medicare Other | Source: Ambulatory Visit

## 2018-09-20 ENCOUNTER — Encounter (HOSPITAL_COMMUNITY): Payer: Medicare Other | Attending: Cardiology

## 2018-09-20 ENCOUNTER — Ambulatory Visit (INDEPENDENT_AMBULATORY_CARE_PROVIDER_SITE_OTHER): Payer: Medicare Other | Admitting: Endocrinology

## 2018-09-20 VITALS — Ht 63.0 in

## 2018-09-20 DIAGNOSIS — E1165 Type 2 diabetes mellitus with hyperglycemia: Secondary | ICD-10-CM

## 2018-09-20 DIAGNOSIS — Z794 Long term (current) use of insulin: Secondary | ICD-10-CM

## 2018-09-20 NOTE — Progress Notes (Signed)
Patient ID: [name], female   DOB: 04-Jul-1940, 78 y.o.   MRN: [id number]          Today's office visit was provided via telemedicine using a telephone call to the patient Patient has been explained the limitations of evaluation and management by telemedicine and the availability of in person appointments.  The patient understood the limitations and agreed to proceed. Patient also understood that the telehealth visit is billable.  Location of the patient: Home  Location of the provider: Office Only the patient and myself were participating in the encounter  Reason for Appointment: for Type 2 Diabetes  Referring PCP: Koleen Distance   History of Present Illness:          Date of diagnosis of type 2 diabetes mellitus: 1988       Background history:   Patient has been on insulin for a few years and she is unclear when this was started, according to Epic she was started on Levemir in 2015 Prior to that likely had been on various oral hypoglycemic agents including metformin, glipizide and Tradjenta Her level of control has been somewhat variable but not consistently poor; her A1c has ranged between 5.6 and 7.6  Recent history:   Most recent A1c is 8.7 done on 08/08/2018  INSULIN regimen is:  [place] 21 units in the mornings daily, Humalog 3 units before meals      Non-insulin hypoglycemic drugs the patient is taking are: Trulicity 1.5 mg weekly  Current management, blood sugar patterns and problems identified:  She was started on Humalog insulin on her initial consultation in 08/2018  She was told to start with 3 units and if blood sugars are still over 180 to increase up to 6 units  However the patient has not changed her dose from 3 units  She thinks she is taking the insulin before each meal  However on the history is difficult to obtain she appears to have very inconsistent mealtimes  She was also having some HYPOGLYCEMIA on her previous visit and was told to reduce her  Tyler Aas down to 8 units but she is still appears to be taking the same dose of 21 units  However she has not had any further hypoglycemia and her lowest blood sugar was 105 fasting  Is unclear from her history whether she is checking her sugars before or after eating during the day and sometimes will check a second blood sugar 30 to 60 minutes later  Except for an occasional blood sugar around 160 midday and 9 PM most of her blood sugars are at least 280  Highest blood sugar is 241  She thinks she is regular with her Trulicity and [place] daily  She was told to see the dietitian but she did not respond to phone calls to schedule this        Side effects from medications have been: None known     Meal times are:  Breakfast is at variable times between 8 AM-12 noon  Typical meal intake: Breakfast is usually raisin Bran               Exercise:  Unable to do any  Glucose monitoring:  done 1-2 times a day         Glucometer: One Touch Verio.       Blood Glucose readings by time of day    PRE-MEAL Fasting Lunch Dinner Bedtime Overall  Glucose range: 105- ? 238  156-233   158-241  Mean/median:     ?   Previous readings:  PREMEAL Breakfast Lunch Dinner Bedtime  Overall   Glucose range:  57-275  153-237  330  208,228 ?  Median:        POST-MEAL  11 AM  PC Lunch PC Dinner  Glucose range:  91-248     Median:       Dietician visit, most recent: 2019  Weight history:  Wt Readings from Last 3 Encounters:  09/12/18 147 lb 1.6 oz (66.7 kg)  08/18/18 141 lb (64 kg)  08/15/18 147 lb 4.8 oz (66.8 kg)    Glycemic control:   Lab Results  Component Value Date   HGBA1C 8.7 (A) 08/08/2018   HGBA1C 5.9 (A) 07/15/2017   HGBA1C 5.6 09/28/2016   Lab Results  Component Value Date   LDLCALC 82 11/29/2014   CREATININE 1.62 (H) 07/18/2018   Lab Results  Component Value Date   MICRALBCREAT <5.5 06/20/2015    No results found for: FRUCTOSAMINE  Orders Only on 09/15/2018    Component Date Value Ref Range Status   SARS-CoV-2, NAA 09/15/2018 Not Detected  Not Detected Final   Comment: This test was developed and its performance characteristics determined by [employer]. This test has not been FDA cleared or approved. This test has been authorized by FDA under an Emergency Use Authorization (EUA). This test is only authorized for the duration of time the declaration that circumstances exist justifying the authorization of the emergency use of in vitro diagnostic tests for detection of SARS-CoV-2 virus and/or diagnosis of COVID-19 infection under section 564(b)(1) of the Act, 21 U.S.C. 295MWU-1(L)(2), unless the authorization is terminated or revoked sooner. When diagnostic testing is negative, the possibility of a false negative result should be considered in the context of a patient's recent exposures and the presence of clinical signs and symptoms consistent with COVID-19. An individual without symptoms of COVID-19 and who is not shedding SARS-CoV-2 virus would expect to have a negative (not detected) result in this assay.    specimen status report 09/15/2018 Comment   Final   Comment: Please note Please note The date and/or time of collection was not indicated on the requisition as required by state and federal law.  The date of receipt of the specimen was used as the collection date if not supplied.     Allergies as of 09/20/2018      Reactions   Aspirin Nausea And Vomiting      Medication List       Accurate as of September 20, 2018 11:31 AM. If you have any questions, ask your nurse or doctor.        STOP taking these medications   traMADol 50 MG tablet Commonly known as: Ultram Stopped by: Elayne Snare, MD     TAKE these medications   augmented betamethasone dipropionate 0.05 % cream Commonly known as: DIPROLENE-AF Apply 1 application topically daily.   b complex vitamins capsule Take 1 capsule by mouth daily.   B-D UF  III MINI PEN NEEDLES 31G X 5 MM Misc Generic drug: Insulin Pen Needle USE TO INJECT INSULIN  3 TIMES DAILY   calcium citrate-vitamin D 315-200 MG-UNIT tablet Commonly known as: CITRACAL+D Take 1 tablet by mouth 2 (two) times daily.   cetaphil lotion Apply 1 application topically as needed for dry skin.   donepezil 10 MG tablet Commonly known as: ARICEPT Take 1 tablet (10 mg total) by mouth at bedtime.   Ferrocite 324 (106  Fe) MG Tabs tablet Generic drug: Ferrous Fumarate TAKE 1 TABLET BY MOUTH TWICE DAILY   furosemide 20 MG tablet Commonly known as: LASIX Take 1 tablet (20 mg total) by mouth daily.   insulin lispro 100 UNIT/ML KwikPen Commonly known as: HumaLOG KwikPen Inject 3 units under the skin three times daily before each meal. May increase up to 6 units before meals if needed.   lactulose 10 GM/15ML solution Commonly known as: CHRONULAC TAKE 15 ML BY MOUTH TWO TIMES DAILY   levETIRAcetam 500 MG tablet Commonly known as: KEPPRA Take 1 tablet (500 mg total) by mouth 2 (two) times daily.   Medical Compression Stockings Misc Wear as much as possible while awake to reduce swelling   memantine 10 MG tablet Commonly known as: NAMENDA Take 1 tablet (10 mg total) by mouth 2 (two) times daily.   OneTouch Delica Lancets Fine Misc Check blood sugar up to 4 times a day   OneTouch Verio test strip Generic drug: glucose blood Use as instructed 4 times daily. E11.29, insulin requiring   oxycodone-acetaminophen 2.5-325 MG tablet Commonly known as: PERCOCET Take 1 tablet by mouth at bedtime as needed for pain.   oxymetazoline 0.05 % nasal spray Commonly known as: AFRIN Place 1 spray into both nostrils 2 (two) times daily as needed (Epistaxis).   pantoprazole 40 MG tablet Commonly known as: PROTONIX Take 1 tablet by mouth once daily   potassium chloride 10 MEQ tablet Commonly known as: K-DUR Take 1 tablet (10 mEq total) by mouth daily.   sodium chloride 0.65 %  Soln nasal spray Commonly known as: OCEAN Place 1 spray into both nostrils as needed for congestion.   Tyler Aas FlexTouch 100 UNIT/ML Sopn FlexTouch Pen Generic drug: insulin degludec Inject 0.21 mLs (21 Units total) into the skin every evening.   triamcinolone ointment 0.5 % Commonly known as: KENALOG APPLY 1 APPLICATION TOPICALLY TO RASH TWICE DAILY FOR ITCHING   Trulicity 1.5 ZO/1.0RU Sopn Generic drug: Dulaglutide INJECT 1.5 MG INTO THE SKIN ONCE A WEEK. 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    Past Surgical History:  Procedure Laterality Date   CATARACT EXTRACTION     "I think it was just one eye"   ESOPHAGOGASTRODUODENOSCOPY  02/26/2011  Procedure: ESOPHAGOGASTRODUODENOSCOPY (EGD);  Surgeon: [name], MD;  Location: [name];  Service: Endoscopy;  Laterality: N/A;    ESOPHAGOGASTRODUODENOSCOPY N/A 11/08/2012   Procedure: ESOPHAGOGASTRODUODENOSCOPY (EGD);  Surgeon: [name], MD;  Location: [name];  Service: Endoscopy;  Laterality: N/A;   ESOPHAGOGASTRODUODENOSCOPY N/A 10/04/2013   Procedure: ESOPHAGOGASTRODUODENOSCOPY (EGD);  Surgeon: Winfield Cunas., MD;  Location: [name];  Service: Endoscopy;  Laterality: N/A;  with APC on stand-by   ESOPHAGOGASTRODUODENOSCOPY N/A 07/06/2014   Procedure: ESOPHAGOGASTRODUODENOSCOPY (EGD);  Surgeon: Clarene Essex, MD;  Location: [name];  Service: Endoscopy;  Laterality: N/A;   ESOPHAGOGASTRODUODENOSCOPY N/A 09/05/2014   Procedure: ESOPHAGOGASTRODUODENOSCOPY (EGD);  Surgeon: Laurence Spates, MD;  Location: [name];  Service: Endoscopy;  Laterality: N/A;  APC on standby to control bleeding   ESOPHAGOGASTRODUODENOSCOPY N/A 11/29/2014   Procedure: ESOPHAGOGASTRODUODENOSCOPY (EGD);  Surgeon: [name], MD;  Location: [facility] ENDOSCOPY;  Service: Endoscopy;  Laterality: N/A;   ESOPHAGOGASTRODUODENOSCOPY N/A 09/28/2015   Procedure: ESOPHAGOGASTRODUODENOSCOPY (EGD);  Surgeon: Clarene Essex, MD;  Location[facility] ENDOSCOPY;  Service: Endoscopy;  Laterality: N/A;   ESOPHAGOGASTRODUODENOSCOPY (EGD) WITH PROPOFOL N/A 12/04/2017   Procedure: ESOPHAGOGASTRODUODENOSCOPY (EGD) WITH PROPOFOL;  Surgeon: [name], MD;  Location: [facility];  Service: Endoscopy;  Laterality: N/A;   ESOPHAGOGASTRODUODENOSCOPY ENDOSCOPY  08/19/2006   with laser treatment   HOT HEMOSTASIS N/A 07/06/2014   Procedure: HOT HEMOSTASIS (ARGON PLASMA COAGULATION/BICAP);  Surgeon: Clarene Essex, MD;  Location: [name];  Service: Endoscopy;  Laterality: N/A;   HOT HEMOSTASIS N/A 09/28/2015   Procedure: HOT HEMOSTASIS (ARGON PLASMA COAGULATION/BICAP);  Surgeon: Clarene Essex, MD;  Location: [facility] ENDOSCOPY;  Service: Endoscopy;  Laterality: N/A;   HOT HEMOSTASIS N/A 12/04/2017   Procedure: HOT HEMOSTASIS (ARGON PLASMA COAGULATION/BICAP);   Surgeon: [name], MD;  Location: [facility];  Service: Endoscopy;  Laterality: N/A;   NASAL HEMORRHAGE CONTROL     "for bleeding"    SAVORY DILATION  02/26/2011   Procedure: SAVORY DILATION;  Surgeon: [name], MD;  Location: [name];  Service: Endoscopy;  Laterality: N/A;  c-arm needed   SUBMUCOSAL INJECTION  12/04/2017   Procedure: SUBMUCOSAL INJECTION;  Surgeon: [name], MD;  Location: [facility] ENDOSCOPY;  Service: Endoscopy;;    Family History  Problem Relation Age of Onset   Stroke Father    Healthy Mother    Breast cancer Other    Diabetes Niece    Malignant hyperthermia Neg Hx    Seizures Neg Hx     Social History:  reports that she quit smoking about 47 years ago. Her smoking use included cigarettes. She has a 20.00 pack-year smoking history. She has never used smokeless tobacco. She reports that she does not drink alcohol or use drugs.   Review of Systems  Constitutional: Positive for weight loss.  HENT: Negative for headaches.   Eyes: Positive for blurred vision.  Cardiovascular: Negative for chest pain.  Gastrointestinal: Negative for nausea.  Endocrine: Negative for polydipsia and light-headedness.  Genitourinary: Positive for nocturia.       3x  Musculoskeletal: Negative for joint pain.  Skin: Negative for rash.  Neurological: Positive for weakness.       Has difficulty remembering and is on Aricept for dementia.  She does get help from her husband  Psychiatric/Behavioral: Negative for insomnia.    Hematology: She has had iron deficiency anemia requiring infusions every 1 to 2 months  Lipid history: Not available, not on any statin drugs    Lab Results  Component Value  Date   CHOL 141 11/29/2014   HDL 31 (L) 11/29/2014   LDLCALC 82 11/29/2014   TRIG 141 11/29/2014   CHOLHDL 4.5 11/29/2014           Hypertension: Has not been on treatment for this  BP Readings from Last 3 Encounters:  09/12/18 (!) 152/52  09/12/18  (!) 165/45  08/22/18 (!) 124/51    Most recent eye exam was in 07/2018  Most recent foot exam: 06/2018 by podiatrist with no evidence of sensory loss or reduction in sensation   Complications of diabetes: Reportedly has retinopathy, currently without a foot exam not clear if she has neuropathy.  No history of microalbuminuria  LABS:  Orders Only on 09/15/2018  Component Date Value Ref Range Status   SARS-CoV-2, NAA 09/15/2018 Not Detected  Not Detected Final   Comment: This test was developed and its performance characteristics determined by [employer]. This test has not been FDA cleared or approved. This test has been authorized by FDA under an Emergency Use Authorization (EUA). This test is only authorized for the duration of time the declaration that circumstances exist justifying the authorization of the emergency use of in vitro diagnostic tests for detection of SARS-CoV-2 virus and/or diagnosis of COVID-19 infection under section 564(b)(1) of the Act, 21 U.S.C. 161WRU-0(A)(5), unless the authorization is terminated or revoked sooner. When diagnostic testing is negative, the possibility of a false negative result should be considered in the context of a patient's recent exposures and the presence of clinical signs and symptoms consistent with COVID-19. An individual without symptoms of COVID-19 and who is not shedding SARS-CoV-2 virus would expect to have a negative (not detected) result in this assay.    specimen status report 09/15/2018 Comment   Final   Comment: Please note Please note The date and/or time of collection was not indicated on the requisition as required by state and federal law.  The date of receipt of the specimen was used as the collection date if not supplied.     Physical Examination:  Ht 5\' 3"  (1.6 m)    LMP  (LMP Unknown)    BMI 26.06 kg/m   Exam not done as patient is remote    ASSESSMENT:  Diabetes type 2 on insulin  See  history of present illness for detailed discussion of current diabetes management, blood sugar patterns and problems identified  Her last A1c was 8.7  She is on Trulicity and now small doses of Humalog with her Tyler Aas  Patient has not been seen in the office and history is somewhat difficult to obtain Also difficult to get a clear pattern from her blood sugar history Her mealtimes are irregular and not clear which readings are after meals   RENAL dysfunction of unclear etiology, needs regular follow-up  PLAN:    1. Glucose monitoring: She will continue to use her One Touch meter but needs to bring this and to make sure it is accurate  Patient advised to check readings either fasting or 2 hours after meals on a regular basis and not just in the mornings  2.  Diabetes education:  Patient will need formal meal planning and will try to have consultation with dietitian rescheduled  May also need consultation with CDE for evaluation of her insulin regimen  3.  Lifestyle changes:  Dietary changes: Balanced meals and avoid juices and cereals frequently  4.  Medication changes needed:  Go up to 5 units of Humalog before every meal  For now use 20 units of Tresiba but if she has low sugars in the mornings will need to reduce this  5.  Preventive care needed:   Prevnar which is overdue  6.  Follow-up: 4 weeks for reassessment, also will need office visit and labs    There are no Patient Instructions on file for this visit.      Duration of telephone encounter today = 8 minutes  Elayne Snare 09/20/2018, 11:31 AM   Note: This office note was prepared with Dragon voice recognition system technology. Any transcriptional errors that result from this process are unintentional.

## 2018-09-21 ENCOUNTER — Other Ambulatory Visit: Payer: Self-pay

## 2018-09-21 MED ORDER — OXYCODONE-ACETAMINOPHEN 2.5-325 MG PO TABS: 1.0000 | ORAL_TABLET | Freq: Every evening | ORAL | 0 refills | Status: DC | PRN

## 2018-09-21 NOTE — Telephone Encounter (Signed)
Per documentation pt was only given a 7 day supply of oxy. This was due to insurance. Pharmacist states now it can be written for any # of days the md desires. Pt picked up 7 on the 09/16/2018

## 2018-09-21 NOTE — Telephone Encounter (Signed)
oxycodone-acetaminophen (PERCOCET) 2.5-325 MG tablet   Refill request @  [facility] [place], [place] [phone] (Phone) [phone] (Fax)

## 2018-09-26 ENCOUNTER — Inpatient Hospital Stay: Payer: Medicare Other

## 2018-09-26 ENCOUNTER — Other Ambulatory Visit: Payer: Self-pay

## 2018-09-26 VITALS — BP 151/55 | HR 59 | Temp 98.3°F | Resp 16

## 2018-09-26 DIAGNOSIS — D500 Iron deficiency anemia secondary to blood loss (chronic): Secondary | ICD-10-CM

## 2018-09-26 DIAGNOSIS — Z5112 Encounter for antineoplastic immunotherapy: Secondary | ICD-10-CM | POA: Diagnosis not present

## 2018-09-26 DIAGNOSIS — K31819 Angiodysplasia of stomach and duodenum without bleeding: Secondary | ICD-10-CM

## 2018-09-26 DIAGNOSIS — I780 Hereditary hemorrhagic telangiectasia: Secondary | ICD-10-CM

## 2018-09-26 DIAGNOSIS — Z95828 Presence of other vascular implants and grafts: Secondary | ICD-10-CM

## 2018-09-26 LAB — CBC WITH DIFFERENTIAL (CANCER CENTER ONLY)
Abs Immature Granulocytes: 0.01 10*3/uL (ref 0.00–0.07)
Basophils Absolute: 0 10*3/uL (ref 0.0–0.1)
Basophils Relative: 1 %
Eosinophils Absolute: 0.4 10*3/uL (ref 0.0–0.5)
Eosinophils Relative: 8 %
HCT: 33.4 % — ABNORMAL LOW (ref 36.0–46.0)
Hemoglobin: 10.9 g/dL — ABNORMAL LOW (ref 12.0–15.0)
Immature Granulocytes: 0 %
Lymphocytes Relative: 11 %
Lymphs Abs: 0.5 10*3/uL — ABNORMAL LOW (ref 0.7–4.0)
MCH: 32.1 pg (ref 26.0–34.0)
MCHC: 32.6 g/dL (ref 30.0–36.0)
MCV: 98.2 fL (ref 80.0–100.0)
Monocytes Absolute: 0.4 10*3/uL (ref 0.1–1.0)
Monocytes Relative: 9 %
Neutro Abs: 3.2 10*3/uL (ref 1.7–7.7)
Neutrophils Relative %: 71 %
Platelet Count: 136 10*3/uL — ABNORMAL LOW (ref 150–400)
RBC: 3.4 MIL/uL — ABNORMAL LOW (ref 3.87–5.11)
RDW: 15.2 % (ref 11.5–15.5)
WBC Count: 4.5 10*3/uL (ref 4.0–10.5)
nRBC: 0 % (ref 0.0–0.2)

## 2018-09-26 MED ORDER — HEPARIN SOD (PORK) LOCK FLUSH 100 UNIT/ML IV SOLN
500.0000 [IU] | Freq: Once | INTRAVENOUS | Status: AC | PRN
  Administered 2018-09-26: 500 [IU]
  Filled 2018-09-26: qty 5

## 2018-09-26 MED ORDER — SODIUM CHLORIDE 0.9% FLUSH
10.0000 mL | INTRAVENOUS | Status: DC | PRN
  Administered 2018-09-26: 12:00:00 10 mL
  Filled 2018-09-26: qty 10

## 2018-09-26 MED ORDER — SODIUM CHLORIDE 0.9% FLUSH
10.0000 mL | INTRAVENOUS | Status: DC | PRN
  Administered 2018-09-26: 10:00:00 10 mL
  Filled 2018-09-26: qty 10

## 2018-09-26 MED ORDER — SODIUM CHLORIDE 0.9 % IV SOLN
Freq: Once | INTRAVENOUS | Status: AC
  Administered 2018-09-26: 11:00:00 via INTRAVENOUS
  Filled 2018-09-26: qty 250

## 2018-09-26 MED ORDER — SODIUM CHLORIDE 0.9 % IV SOLN
4.5000 mg/kg | Freq: Once | INTRAVENOUS | Status: AC
  Administered 2018-09-26: 300 mg via INTRAVENOUS
  Filled 2018-09-26: qty 12

## 2018-09-26 NOTE — Patient Instructions (Signed)
[facility] Discharge Instructions for Patients Receiving Chemotherapy  Today you received the following chemotherapy agents Avastin  To help prevent nausea and vomiting after your treatment, we encourage you to take your nausea medication as directed   If you develop nausea and vomiting that is not controlled by your nausea medication, call the clinic.   BELOW ARE SYMPTOMS THAT SHOULD BE REPORTED IMMEDIATELY:  *FEVER GREATER THAN 100.5 F  *CHILLS WITH OR WITHOUT FEVER  NAUSEA AND VOMITING THAT IS NOT CONTROLLED WITH YOUR NAUSEA MEDICATION  *UNUSUAL SHORTNESS OF BREATH  *UNUSUAL BRUISING OR BLEEDING  TENDERNESS IN MOUTH AND THROAT WITH OR WITHOUT PRESENCE OF ULCERS  *URINARY PROBLEMS  *BOWEL PROBLEMS  UNUSUAL RASH Items with * indicate a potential emergency and should be followed up as soon as possible.  Feel free to call the clinic should you have any questions or concerns. The clinic phone number is [phone].  Please show the CHEMO ALERT CARD at check-in to the Emergency Department and triage nurse.   

## 2018-10-03 ENCOUNTER — Other Ambulatory Visit: Payer: Self-pay

## 2018-10-03 NOTE — Telephone Encounter (Signed)
oxycodone-acetaminophen (PERCOCET) 2.5-325 MG tablet   REFILL REQUEST @  [place], [place] (Phone) [phone] (Fax)

## 2018-10-04 ENCOUNTER — Telehealth: Payer: Self-pay | Admitting: Dietician

## 2018-10-04 DIAGNOSIS — E1129 Type 2 diabetes mellitus with other diabetic kidney complication: Secondary | ICD-10-CM

## 2018-10-04 DIAGNOSIS — E1121 Type 2 diabetes mellitus with diabetic nephropathy: Secondary | ICD-10-CM

## 2018-10-04 DIAGNOSIS — IMO0002 Reserved for concepts with insufficient information to code with codable children: Secondary | ICD-10-CM

## 2018-10-04 MED ORDER — ONETOUCH VERIO W/DEVICE KIT: PACK | 1 refills | Status: DC

## 2018-10-04 MED ORDER — ONETOUCH VERIO VI STRP: ORAL_STRIP | 3 refills | Status: DC

## 2018-10-04 NOTE — Telephone Encounter (Signed)
Ms. Halder glucometer is broken. Her husband calls to request a new one.

## 2018-10-06 ENCOUNTER — Telehealth: Payer: Self-pay | Admitting: Internal Medicine

## 2018-10-06 MED ORDER — OXYCODONE-ACETAMINOPHEN 2.5-325 MG PO TABS: 1.0000 | ORAL_TABLET | Freq: Every evening | ORAL | 0 refills | Status: DC | PRN

## 2018-10-06 NOTE — Telephone Encounter (Signed)
Needs a refill on oxycodone-acetaminophen (PERCOCET) 2.5-325 MG tablet;pt contact [place], [address]

## 2018-10-06 NOTE — Telephone Encounter (Signed)
It is scheduled for 9/2

## 2018-10-06 NOTE — Telephone Encounter (Signed)
I will refill to treat her pain through the weekend, but can we schedule an ACC visit next week for her to be seen to ensure pain isn't related to ischemia?   Thanks!

## 2018-10-06 NOTE — Telephone Encounter (Signed)
Has been done but pt needs appt in acc per dr bloomfield

## 2018-10-10 ENCOUNTER — Inpatient Hospital Stay: Payer: Medicare Other

## 2018-10-10 ENCOUNTER — Other Ambulatory Visit: Payer: Self-pay

## 2018-10-10 VITALS — BP 138/60 | HR 62 | Temp 98.7°F | Resp 17 | Wt 148.2 lb

## 2018-10-10 DIAGNOSIS — D500 Iron deficiency anemia secondary to blood loss (chronic): Secondary | ICD-10-CM

## 2018-10-10 DIAGNOSIS — I780 Hereditary hemorrhagic telangiectasia: Secondary | ICD-10-CM

## 2018-10-10 DIAGNOSIS — Z95828 Presence of other vascular implants and grafts: Secondary | ICD-10-CM

## 2018-10-10 DIAGNOSIS — Z5112 Encounter for antineoplastic immunotherapy: Secondary | ICD-10-CM | POA: Diagnosis not present

## 2018-10-10 DIAGNOSIS — K31819 Angiodysplasia of stomach and duodenum without bleeding: Secondary | ICD-10-CM

## 2018-10-10 LAB — TOTAL PROTEIN, URINE DIPSTICK: Protein, ur: NEGATIVE mg/dL

## 2018-10-10 LAB — CBC WITH DIFFERENTIAL (CANCER CENTER ONLY)
Abs Immature Granulocytes: 0.03 10*3/uL (ref 0.00–0.07)
Basophils Absolute: 0 10*3/uL (ref 0.0–0.1)
Basophils Relative: 0 %
Eosinophils Absolute: 0.2 10*3/uL (ref 0.0–0.5)
Eosinophils Relative: 2 %
HCT: 35.2 % — ABNORMAL LOW (ref 36.0–46.0)
Hemoglobin: 11.7 g/dL — ABNORMAL LOW (ref 12.0–15.0)
Immature Granulocytes: 0 %
Lymphocytes Relative: 4 %
Lymphs Abs: 0.3 10*3/uL — ABNORMAL LOW (ref 0.7–4.0)
MCH: 31.5 pg (ref 26.0–34.0)
MCHC: 33.2 g/dL (ref 30.0–36.0)
MCV: 94.6 fL (ref 80.0–100.0)
Monocytes Absolute: 0.8 10*3/uL (ref 0.1–1.0)
Monocytes Relative: 10 %
Neutro Abs: 6.9 10*3/uL (ref 1.7–7.7)
Neutrophils Relative %: 84 %
Platelet Count: 155 10*3/uL (ref 150–400)
RBC: 3.72 MIL/uL — ABNORMAL LOW (ref 3.87–5.11)
RDW: 13.7 % (ref 11.5–15.5)
WBC Count: 8.2 10*3/uL (ref 4.0–10.5)
nRBC: 0 % (ref 0.0–0.2)

## 2018-10-10 LAB — FERRITIN: Ferritin: 154 ng/mL (ref 11–307)

## 2018-10-10 MED ORDER — HEPARIN SOD (PORK) LOCK FLUSH 100 UNIT/ML IV SOLN
500.0000 [IU] | Freq: Once | INTRAVENOUS | Status: AC | PRN
  Administered 2018-10-10: 11:00:00 500 [IU]
  Filled 2018-10-10: qty 5

## 2018-10-10 MED ORDER — SODIUM CHLORIDE 0.9% FLUSH
10.0000 mL | INTRAVENOUS | Status: DC | PRN
  Administered 2018-10-10: 11:00:00 10 mL
  Filled 2018-10-10: qty 10

## 2018-10-10 MED ORDER — SODIUM CHLORIDE 0.9 % IV SOLN
4.6000 mg/kg | Freq: Once | INTRAVENOUS | Status: AC
  Administered 2018-10-10: 11:00:00 300 mg via INTRAVENOUS
  Filled 2018-10-10: qty 12

## 2018-10-10 MED ORDER — SODIUM CHLORIDE 0.9% FLUSH
10.0000 mL | INTRAVENOUS | Status: DC | PRN
  Administered 2018-10-10: 09:00:00 10 mL
  Filled 2018-10-10: qty 10

## 2018-10-10 MED ORDER — SODIUM CHLORIDE 0.9 % IV SOLN
Freq: Once | INTRAVENOUS | Status: AC
  Administered 2018-10-10: 10:00:00 via INTRAVENOUS
  Filled 2018-10-10: qty 250

## 2018-10-10 NOTE — Patient Instructions (Signed)
Bevacizumab injection What is this medicine? BEVACIZUMAB (be va SIZ yoo mab) is a monoclonal antibody. It is used to treat many types of cancer. This medicine may be used for other purposes; ask your health care provider or pharmacist if you have questions. COMMON BRAND NAME(S): Avastin, MVASI, Zirabev What should I tell my health care provider before I take this medicine? They need to know if you have any of these conditions:  diabetes  heart disease  high blood pressure  history of coughing up blood  prior anthracycline chemotherapy (e.g., doxorubicin, daunorubicin, epirubicin)  recent or ongoing radiation therapy  recent or planning to have surgery  stroke  an unusual or allergic reaction to bevacizumab, hamster proteins, mouse proteins, other medicines, foods, dyes, or preservatives  pregnant or trying to get pregnant  breast-feeding How should I use this medicine? This medicine is for infusion into a vein. It is given by a health care professional in a hospital or clinic setting. Talk to your pediatrician regarding the use of this medicine in children. Special care may be needed. Overdosage: If you think you have taken too much of this medicine contact a poison control center or emergency room at once. NOTE: This medicine is only for you. Do not share this medicine with others. What if I miss a dose? It is important not to miss your dose. Call your doctor or health care professional if you are unable to keep an appointment. What may interact with this medicine? Interactions are not expected. This list may not describe all possible interactions. Give your health care provider a list of all the medicines, herbs, non-prescription drugs, or dietary supplements you use. Also tell them if you smoke, drink alcohol, or use illegal drugs. Some items may interact with your medicine. What should I watch for while using this medicine? Your condition will be monitored carefully while  you are receiving this medicine. You will need important blood work and urine testing done while you are taking this medicine. This medicine may increase your risk to bruise or bleed. Call your doctor or health care professional if you notice any unusual bleeding. This medicine should be started at least 28 days following major surgery and the site of the surgery should be totally healed. Check with your doctor before scheduling dental work or surgery while you are receiving this treatment. Talk to your doctor if you have recently had surgery or if you have a wound that has not healed. Do not become pregnant while taking this medicine or for 6 months after stopping it. Women should inform their doctor if they wish to become pregnant or think they might be pregnant. There is a potential for serious side effects to an unborn child. Talk to your health care professional or pharmacist for more information. Do not breast-feed an infant while taking this medicine and for 6 months after the last dose. This medicine has caused ovarian failure in some women. This medicine may interfere with the ability to have a child. You should talk to your doctor or health care professional if you are concerned about your fertility. What side effects may I notice from receiving this medicine? Side effects that you should report to your doctor or health care professional as soon as possible:  allergic reactions like skin rash, itching or hives, swelling of the face, lips, or tongue  chest pain or chest tightness  chills  coughing up blood  high fever  seizures  severe constipation  signs and symptoms   of bleeding such as bloody or black, tarry stools; red or dark-brown urine; spitting up blood or brown material that looks like coffee grounds; red spots on the skin; unusual bruising or bleeding from the eye, gums, or nose  signs and symptoms of a blood clot such as breathing problems; chest pain; severe, sudden  headache; pain, swelling, warmth in the leg  signs and symptoms of a stroke like changes in vision; confusion; trouble speaking or understanding; severe headaches; sudden numbness or weakness of the face, arm or leg; trouble walking; dizziness; loss of balance or coordination  stomach pain  sweating  swelling of legs or ankles  vomiting  weight gain Side effects that usually do not require medical attention (report to your doctor or health care professional if they continue or are bothersome):  back pain  changes in taste  decreased appetite  dry skin  nausea  tiredness This list may not describe all possible side effects. Call your doctor for medical advice about side effects. You may report side effects to FDA at 1-800-FDA-1088. Where should I keep my medicine? This drug is given in a hospital or clinic and will not be stored at home. NOTE: This sheet is a summary. It may not cover all possible information. If you have questions about this medicine, talk to your doctor, pharmacist, or health care provider.  2020 Elsevier/Gold Standard (2016-01-24 14:33:29)  Coronavirus (COVID-19) Are you at risk?  Are you at risk for the Coronavirus (COVID-19)?  To be considered HIGH RISK for Coronavirus (COVID-19), you have to meet the following criteria:  . Traveled to [place], [place], [place], [place] or [place]; or in the [place] to [place], [place], [place], or [place]; and have fever, cough, and shortness of breath within the last 2 weeks of travel OR . Been in close contact with a person diagnosed with COVID-19 within the last 2 weeks and have fever, cough, and shortness of breath . IF YOU DO NOT MEET THESE CRITERIA, YOU ARE CONSIDERED LOW RISK FOR COVID-19.  What to do if you are HIGH RISK for COVID-19?  . If you are having a medical emergency, call 911. . Seek medical care right away. Before you go to a doctor's office, urgent care or emergency department, call  ahead and tell them about your recent travel, contact with someone diagnosed with COVID-19, and your symptoms. You should receive instructions from your physician's office regarding next steps of care.  . When you arrive at healthcare provider, tell the healthcare staff immediately you have returned from visiting [place], [place], [place], [place] or [place]; or traveled in the [place] to [place], [place], [place], or [place]; in the last two weeks or you have been in close contact with a person diagnosed with COVID-19 in the last 2 weeks.   . Tell the health care staff about your symptoms: fever, cough and shortness of breath. . After you have been seen by a medical provider, you will be either: o Tested for (COVID-19) and discharged home on quarantine except to seek medical care if symptoms worsen, and asked to  - Stay home and avoid contact with others until you get your results (4-5 days)  - Avoid travel on public transportation if possible (such as bus, train, or airplane) or o Sent to the Emergency Department by EMS for evaluation, COVID-19 testing, and possible admission depending on your condition and test results.  What to do if you are LOW RISK   for COVID-19?  Reduce your risk of any infection by using the same precautions used for avoiding the common cold or flu:  . Wash your hands often with soap and warm water for at least 20 seconds.  If soap and water are not readily available, use an alcohol-based hand sanitizer with at least 60% alcohol.  . If coughing or sneezing, cover your mouth and nose by coughing or sneezing into the elbow areas of your shirt or coat, into a tissue or into your sleeve (not your hands). . Avoid shaking hands with others and consider head nods or verbal greetings only. . Avoid touching your eyes, nose, or mouth with unwashed hands.  . Avoid close contact with people who are sick. . Avoid places or events with large numbers of people in one location,  like concerts or sporting events. . Carefully consider travel plans you have or are making. . If you are planning any travel outside or inside the [place], visit the CDC's Travelers' Health webpage for the latest health notices. . If you have some symptoms but not all symptoms, continue to monitor at home and seek medical attention if your symptoms worsen. . If you are having a medical emergency, call 911.   ADDITIONAL HEALTHCARE OPTIONS FOR PATIENTS  [facility] / e-Visit: [email or web address]         [facility]: [phone]  [facility]: [phone]                   [facility]: [phone]   

## 2018-10-12 ENCOUNTER — Ambulatory Visit (INDEPENDENT_AMBULATORY_CARE_PROVIDER_SITE_OTHER): Payer: Medicare Other | Admitting: Internal Medicine

## 2018-10-12 ENCOUNTER — Encounter: Payer: Self-pay | Admitting: Internal Medicine

## 2018-10-12 ENCOUNTER — Other Ambulatory Visit: Payer: Self-pay

## 2018-10-12 ENCOUNTER — Other Ambulatory Visit: Payer: Self-pay | Admitting: Internal Medicine

## 2018-10-12 VITALS — BP 163/48 | HR 53 | Temp 98.3°F | Ht 63.0 in | Wt 148.5 lb

## 2018-10-12 DIAGNOSIS — I5032 Chronic diastolic (congestive) heart failure: Secondary | ICD-10-CM | POA: Diagnosis not present

## 2018-10-12 DIAGNOSIS — Z79899 Other long term (current) drug therapy: Secondary | ICD-10-CM | POA: Diagnosis not present

## 2018-10-12 DIAGNOSIS — I739 Peripheral vascular disease, unspecified: Secondary | ICD-10-CM

## 2018-10-12 DIAGNOSIS — Z87891 Personal history of nicotine dependence: Secondary | ICD-10-CM | POA: Diagnosis not present

## 2018-10-12 MED ORDER — OXYCODONE-ACETAMINOPHEN 2.5-325 MG PO TABS: 1.0000 | ORAL_TABLET | Freq: Every evening | ORAL | 0 refills | Status: DC | PRN

## 2018-10-12 NOTE — Progress Notes (Signed)
CC: bilateral LE pain associated with peripheral vascular disease  HPI:  [name] is a 78 y.o. female with PMH below.  Today we will address bilateral LE pain associated with peripheral vascular disease.  Please see A&P for status of the patient's chronic medical conditions  Past Medical History:  Diagnosis Date  . Chronic anemia   . Chronic diastolic CHF (congestive heart failure) ([place]) 10/03/2013  . Chronic GI bleeding    [name] 11/29/2014  . Family history of anesthesia complication    "niece has a hard time coming out" (09/15/2012)  . Frequent nosebleeds    chronic  . Gastric AV malformation    [name] 11/29/2014  . GERD (gastroesophageal reflux disease)   . 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 History of blood transfusion "several"  . History of epistaxis   . HTN (hypertension), benign 03/02/2012  . Hyperlipidemia   . Iron deficiency anemia    chronic infusions"  . Lichen planus    Both lower extremities  . 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 Overgrown toenails 03/18/2017  . Pneumonia 1990's X 2  . Pulmonary HTN ([place]) 04/01/2017   PASP 74mmHg on echo 09/2016 and 51mmHg by echo 2019  . Seizures ([place]) 09/2014  . Symptomatic anemia 11/29/2014  . Telangiectasia    Gastric   . 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   Review of Systems:  ROS: Pulmonary: pt denies increased work of breathing, shortness of breath,  Cardiac: pt denies palpitations, chest pain,  Abdominal: pt denies abdominal pain, nausea, vomiting, or diarrhea   Physical Exam:  Vitals:   10/12/18 1011  BP: (!) 163/48  Pulse: (!) 53  Temp: 98.3 F (36.8 C)  TempSrc: Oral  SpO2: 97%  Weight: 148 lb 8 oz (67.4 kg)  Height: 5\' 3"  (1.6 m)   Cardiac: distant heart sounds, no LE edema, cannot appreciate dorsalis pedis or posterior tibialis and bilateral lower extremities Pulmonary: CTAB, not in distress Abdominal: non distended abdomen,  soft and nontender Neuro: normal strength and sensation in bilateral lower extremities.  Normal vibratory sensation in bilateral lower extremities.  No skin breakdown on lower extremities or feet.  Normal patellar reflexes bilaterally   Social History   Socioeconomic History  . Marital status: Married    Spouse name: [name]  . Number of children: 0  . Years of education: 9  . Highest education level: Not on file  Occupational History  . Occupation: Retired Systems developer: RETIRED  Social Needs  . Financial resource strain: Not on file  . Food insecurity    Worry: Not on file    Inability: Not on file  . Transportation needs    Medical: Not on file    Non-medical: Not on file  Tobacco Use  . Smoking status: Former Smoker    Packs/day: 1.00    Years: 20.00    Pack years: 20.00    Types: Cigarettes    Quit date: 02/10/1971    Years since quitting: 47.7  . Smokeless tobacco: Never Used  . Tobacco comment: 09/15/2012 "smoked 50-60 yr ago"  Substance and Sexual Activity  . Alcohol use: No    Alcohol/week: 0.0 standard drinks  . Drug use: No  . Sexual activity: Not on file  Lifestyle  . Physical activity    Days per week: Not on file    Minutes per session: Not on  file  . Stress: Not on file  Relationships  . Social Herbalist on phone: Not on file    Gets together: Not on file    Attends religious service: Not on file    Active member of club or organization: Not on file    Attends meetings of clubs or organizations: Not on file    Relationship status: Not on file  . Intimate partner violence    Fear of current or ex partner: Not on file    Emotionally abused: Not on file    Physically abused: Not on file    Forced sexual activity: Not on file  Other Topics Concern  . Not on file  Social History Narrative   Married, lives with husband, [name].  Ambulates without assistance.     Caffeine use: none    Family History  Problem Relation Age of Onset   . Stroke Father   . Healthy Mother   . Breast cancer Other   . Diabetes Niece   . Malignant hyperthermia Neg Hx   . Seizures Neg Hx     Assessment & Plan:   See Encounters Tab for problem based charting.  Patient discussed with Dr. Rebeca Alert

## 2018-10-12 NOTE — Assessment & Plan Note (Addendum)
Classic claudication symptoms, known peripheral arterial disease.  Pain improves with rest can't stand or walk for long.  Can't walk for long or stand long because they feel very tired and hurt.  No real joint pains.  Circumferential distribution to the pain.  Saw a vascular surgeon in the past who recommended a walking regimen.  Lives with her husband but he is older has one kidney and can help her some but he's older as well not very strong if she has trouble.  She is afraid of falling has not done exercise program on her own. She is completely against having any vascular procedures.  She is not a good candidate for antiplatelet agents given her bleeding history and HHT.  No bowel or bladder incontinence  -will try to refer to cardiac rehab -will send refill request to PCP for percocet to help with pain symptoms

## 2018-10-12 NOTE — Patient Instructions (Signed)
[name] we have referred you to cardiac rehab where they can start you on an exercise program for your legs.  Please work with them to see if you can get your legs feeling better.  In the mean time continue to discuss use of percocet with your primary doctor to help with your symptoms.

## 2018-10-12 NOTE — Assessment & Plan Note (Deleted)
Bilateral le pain: improves with rest can't stand or walk for long.  Can't walk for long or stand long because they feel very tired and hurt.  No real joint pains.  Circumferential distribution to the pain.  Saw a vascular surgeon in the past who recommended a walking regimen.  Lives with her husband but he is older has one kidney and can help her some but he's older as well not very strong if she has trouble.  She is afraid of falling has not done exercise program.

## 2018-10-12 NOTE — Assessment & Plan Note (Signed)
Pt requires refills on medications with associated diagnosis above.  Reviewed disease process and find this medication to be necessary, will not change dose or alter current therapy. 

## 2018-10-12 NOTE — Addendum Note (Signed)
Addended by: Guadlupe Spanish B on: 10/12/2018 05:09 PM   Modules accepted: Orders

## 2018-10-13 ENCOUNTER — Telehealth (HOSPITAL_COMMUNITY): Payer: Self-pay

## 2018-10-13 ENCOUNTER — Encounter (HOSPITAL_COMMUNITY): Payer: Self-pay | Admitting: *Deleted

## 2018-10-13 NOTE — Progress Notes (Signed)
Received updated referral for this pt to participate in Pulmonary Rehab with the diagnosis for Chronic Diastolic Heart Failure. Pt also has vascular issues and would benefit greatly from exercise program. Clinical review of pt follow up appt on 10/12/18 with Internal Medicine clinic by Guadlupe Spanish Resident with Dr. Heber Shannon. Pt with Covid Risk Score 6.   Pt appropriate for scheduling for Pulmonary rehab.  Will forward to support staff for scheduling and verification of insurance eligibility/benefits with pt  consent. Cherre Huger, BSN Cardiac and Training and development officer

## 2018-10-13 NOTE — Telephone Encounter (Signed)
Pt insurance is active and benefits verified through [facility]. Co-pay $20.00, DED $0.00/$0.00 met, out of pocket $3,600.00/$3,600.00 met, co-insurance 0%. No pre-authorization required. [place]/[employer] Medicare, 10/13/2018 @ 252PM, CYO#[id number]

## 2018-10-17 ENCOUNTER — Encounter (HOSPITAL_COMMUNITY): Payer: Self-pay | Admitting: Emergency Medicine

## 2018-10-17 ENCOUNTER — Other Ambulatory Visit: Payer: Self-pay

## 2018-10-17 ENCOUNTER — Observation Stay (HOSPITAL_COMMUNITY)
Admission: EM | Admit: 2018-10-17 | Discharge: 2018-10-18 | Disposition: A | Discharge status: Home / Self Care | Payer: Medicare Other | Attending: Family Medicine | Admitting: Family Medicine

## 2018-10-17 DIAGNOSIS — I130 Hypertensive heart and chronic kidney disease with heart failure and stage 1 through stage 4 chronic kidney disease, or unspecified chronic kidney disease: Secondary | ICD-10-CM | POA: Insufficient documentation

## 2018-10-17 DIAGNOSIS — Z794 Long term (current) use of insulin: Secondary | ICD-10-CM | POA: Diagnosis not present

## 2018-10-17 DIAGNOSIS — R001 Bradycardia, unspecified: Secondary | ICD-10-CM | POA: Diagnosis present

## 2018-10-17 DIAGNOSIS — I5032 Chronic diastolic (congestive) heart failure: Secondary | ICD-10-CM | POA: Insufficient documentation

## 2018-10-17 DIAGNOSIS — E1122 Type 2 diabetes mellitus with diabetic chronic kidney disease: Secondary | ICD-10-CM | POA: Diagnosis not present

## 2018-10-17 DIAGNOSIS — M79604 Pain in right leg: Secondary | ICD-10-CM | POA: Diagnosis present

## 2018-10-17 DIAGNOSIS — Z20828 Contact with and (suspected) exposure to other viral communicable diseases: Secondary | ICD-10-CM | POA: Diagnosis not present

## 2018-10-17 DIAGNOSIS — I780 Hereditary hemorrhagic telangiectasia: Secondary | ICD-10-CM | POA: Insufficient documentation

## 2018-10-17 DIAGNOSIS — Z79899 Other long term (current) drug therapy: Secondary | ICD-10-CM | POA: Insufficient documentation

## 2018-10-17 DIAGNOSIS — E785 Hyperlipidemia, unspecified: Secondary | ICD-10-CM | POA: Insufficient documentation

## 2018-10-17 DIAGNOSIS — R252 Cramp and spasm: Secondary | ICD-10-CM | POA: Diagnosis not present

## 2018-10-17 DIAGNOSIS — E119 Type 2 diabetes mellitus without complications: Secondary | ICD-10-CM | POA: Diagnosis present

## 2018-10-17 DIAGNOSIS — Z87891 Personal history of nicotine dependence: Secondary | ICD-10-CM | POA: Insufficient documentation

## 2018-10-17 DIAGNOSIS — N183 Chronic kidney disease, stage 3 unspecified: Secondary | ICD-10-CM | POA: Diagnosis present

## 2018-10-17 DIAGNOSIS — I739 Peripheral vascular disease, unspecified: Secondary | ICD-10-CM | POA: Diagnosis not present

## 2018-10-17 DIAGNOSIS — K219 Gastro-esophageal reflux disease without esophagitis: Secondary | ICD-10-CM | POA: Diagnosis not present

## 2018-10-17 DIAGNOSIS — E875 Hyperkalemia: Secondary | ICD-10-CM | POA: Diagnosis not present

## 2018-10-17 DIAGNOSIS — F0390 Unspecified dementia without behavioral disturbance: Secondary | ICD-10-CM | POA: Insufficient documentation

## 2018-10-17 DIAGNOSIS — I480 Paroxysmal atrial fibrillation: Secondary | ICD-10-CM | POA: Diagnosis not present

## 2018-10-17 DIAGNOSIS — E1129 Type 2 diabetes mellitus with other diabetic kidney complication: Secondary | ICD-10-CM | POA: Diagnosis present

## 2018-10-17 DIAGNOSIS — M8580 Other specified disorders of bone density and structure, unspecified site: Secondary | ICD-10-CM | POA: Insufficient documentation

## 2018-10-17 LAB — MAGNESIUM: Magnesium: 1.8 mg/dL (ref 1.7–2.4)

## 2018-10-17 LAB — CBC
HCT: 38.3 % (ref 36.0–46.0)
Hemoglobin: 12.9 g/dL (ref 12.0–15.0)
MCH: 32 pg (ref 26.0–34.0)
MCHC: 33.7 g/dL (ref 30.0–36.0)
MCV: 95 fL (ref 80.0–100.0)
Platelets: 135 10*3/uL — ABNORMAL LOW (ref 150–400)
RBC: 4.03 MIL/uL (ref 3.87–5.11)
RDW: 13.2 % (ref 11.5–15.5)
WBC: 5.4 10*3/uL (ref 4.0–10.5)
nRBC: 0 % (ref 0.0–0.2)

## 2018-10-17 LAB — COMPREHENSIVE METABOLIC PANEL
ALT: 37 U/L (ref 0–44)
AST: 48 U/L — ABNORMAL HIGH (ref 15–41)
Albumin: 3.7 g/dL (ref 3.5–5.0)
Alkaline Phosphatase: 114 U/L (ref 38–126)
Anion gap: 9 (ref 5–15)
BUN: 30 mg/dL — ABNORMAL HIGH (ref 8–23)
CO2: 24 mmol/L (ref 22–32)
Calcium: 8.9 mg/dL (ref 8.9–10.3)
Chloride: 107 mmol/L (ref 98–111)
Creatinine, Ser: 1.34 mg/dL — ABNORMAL HIGH (ref 0.44–1.00)
GFR calc Af Amer: 44 mL/min — ABNORMAL LOW (ref >60)
GFR calc non Af Amer: 38 mL/min — ABNORMAL LOW (ref >60)
Glucose, Bld: 125 mg/dL — ABNORMAL HIGH (ref 70–99)
Potassium: 5.9 mmol/L — ABNORMAL HIGH (ref 3.5–5.1)
Sodium: 140 mmol/L (ref 135–145)
Total Bilirubin: 0.4 mg/dL (ref 0.3–1.2)
Total Protein: 7 g/dL (ref 6.5–8.1)

## 2018-10-17 MED ORDER — HYDROMORPHONE HCL 1 MG/ML IJ SOLN
0.5000 mg | Freq: Once | INTRAMUSCULAR | Status: AC
  Administered 2018-10-17: 0.5 mg via INTRAVENOUS
  Filled 2018-10-17: qty 1

## 2018-10-17 MED ORDER — ACETAMINOPHEN 325 MG PO TABS
650.0000 mg | ORAL_TABLET | Freq: Once | ORAL | Status: AC
  Administered 2018-10-17: 650 mg via ORAL
  Filled 2018-10-17: qty 2

## 2018-10-17 NOTE — ED Triage Notes (Signed)
Patient c/o cramping to bilateral legs x1 month. Reports taking "cramping pills" with no relief.

## 2018-10-17 NOTE — ED Provider Notes (Signed)
[facility] DEPT Provider Note   CSN: [id number] Arrival date & time: 10/17/18  2122  History   Chief Complaint No chief complaint on file.  HPI [name] is a 78 y.o. female with Hx of CHF and PVD presenting with bilateral leg cramping that she reports has been an ongoing issue "for a while". She reports that her "cramping pills" don't work any longer but is unsure of which pills she takes for this.    Past Medical History:  Diagnosis Date  . Chronic anemia   . Chronic diastolic CHF (congestive heart failure) ([place]) 10/03/2013  . Chronic GI bleeding    [name] 11/29/2014  . Family history of anesthesia complication    "niece has a hard time coming out" (09/15/2012)  . Frequent nosebleeds    chronic  . Gastric AV malformation    [name] 11/29/2014  . GERD (gastroesophageal reflux disease)   . 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 History of blood transfusion "several"  . History of epistaxis   . HTN (hypertension), benign 03/02/2012  . Hyperlipidemia   . Iron deficiency anemia    chronic infusions"  . Lichen planus    Both lower extremities  . 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 Overgrown toenails 03/18/2017  . Pneumonia 1990's X 2  . Pulmonary HTN ([place]) 04/01/2017   PASP 67mHg on echo 09/2016 and 429mg by echo 2019  . Seizures (HCHannah8/2016  . Symptomatic anemia 11/29/2014  . Telangiectasia    Gastric   . 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    Patient Active Problem List   Diagnosis Date Noted  . Port-A-Cath in place 05/23/2018  . Iron deficiency anemia due to chronic blood loss 04/19/2018  . Acute right hip pain 03/10/2018  . Hyperpigmented skin lesion 02/07/2018  . Leg weakness, bilateral 12/23/2017  . Hematemesis 12/04/2017  . Pain localized to upper abdomen 10/05/2017  . Lower extremity pain, bilateral 07/15/2017  . Pulmonary HTN (HCHoldrege02/21/2019  . Heart murmur  04/01/2017  . Osteopenia after menopause 01/13/2017  . Epistaxis 07/30/2016  . Venous insufficiency of both lower extremities 04/27/2016  . Anemia due to chronic blood loss   . Dry skin 02/17/2016  . Peripheral vascular disease (HCMilton11/20/2017  . Pain in joint of right shoulder 10/22/2015  . PAF (paroxysmal atrial fibrillation) (HCSheldon08/20/2017  . Lichen planus 0619/41/7408. Healthcare maintenance 06/20/2015  . Seborrheic keratosis 06/20/2015  . Memory loss 01/30/2015  . Simple febrile convulsions (HCFort Hunt09/12/2014  . Hyperammonemia (HCSeaton08/24/2016  . CKD (chronic kidney disease), stage II   . Chronic diastolic CHF (congestive heart failure) (HCNimrod08/25/2015  . DM (diabetes mellitus), type 2, uncontrolled, with renal complications (HCJustice0814/48/1856. HTN (hypertension), benign 03/02/2012  . Fatigue 07/10/2011  . 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 HHT (hereditary hemorrhagic telangiectasia) (HCPlum Branch12/23/2012    Past Surgical History:  Procedure Laterality Date  . CATARACT EXTRACTION     "I think it was just one eye"  . ESOPHAGOGASTRODUODENOSCOPY  02/26/2011   Procedure: ESOPHAGOGASTRODUODENOSCOPY (EGD);  Surgeon: [name];  [facility];  Service: Endoscopy;  Laterality: N/A;  . ESOPHAGOGASTRODUODENOSCOPY N/A 11/08/2012   Procedure: ESOPHAGOGASTRODUODENOSCOPY (EGD);  Surgeon: [name];  [facility];  Service: Endoscopy;  Laterality: N/A;  . ESOPHAGOGASTRODUODENOSCOPY N/A 10/04/2013   Procedure: ESOPHAGOGASTRODUODENOSCOPY (EGD);  Surgeon: [name];  [name];  Service: Endoscopy;  Laterality: N/A;  with APC on stand-by  . ESOPHAGOGASTRODUODENOSCOPY N/A 07/06/2014   Procedure: ESOPHAGOGASTRODUODENOSCOPY (EGD);  Surgeon: Clarene Essex, MD;  [name];  Service: Endoscopy;  Laterality: N/A;  . ESOPHAGOGASTRODUODENOSCOPY N/A 09/05/2014   Procedure: ESOPHAGOGASTRODUODENOSCOPY (EGD);  Surgeon: Laurence Spates, MD;  [name];  Service: Endoscopy;  Laterality: N/A;  APC on standby to control bleeding  . ESOPHAGOGASTRODUODENOSCOPY N/A 11/29/2014   Procedure: ESOPHAGOGASTRODUODENOSCOPY (EGD);  Surgeon: [name], MD;  Location: [facility] ENDOSCOPY;  Service: Endoscopy;  Laterality: N/A;  . ESOPHAGOGASTRODUODENOSCOPY N/A 09/28/2015   Procedure: ESOPHAGOGASTRODUODENOSCOPY (EGD);  Surgeon: Clarene Essex, MD;  Location: [facility] ENDOSCOPY;  Service: Endoscopy;  Laterality: N/A;  . ESOPHAGOGASTRODUODENOSCOPY (EGD) WITH PROPOFOL N/A 12/04/2017   Procedure: ESOPHAGOGASTRODUODENOSCOPY (EGD) WITH PROPOFOL;  Surgeon: [name], MD;  Location: [place];  Service: Endoscopy;  Laterality: N/A;  . ESOPHAGOGASTRODUODENOSCOPY ENDOSCOPY  08/19/2006   with laser treatment  . HOT HEMOSTASIS N/A 07/06/2014   Procedure: HOT HEMOSTASIS (ARGON PLASMA COAGULATION/BICAP);  Surgeon: Clarene Essex, MD;  [name];  Service: Endoscopy;  Laterality: N/A;  . HOT HEMOSTASIS N/A 09/28/2015   Procedure: HOT HEMOSTASIS (ARGON PLASMA COAGULATION/BICAP);  Surgeon: Clarene Essex, MD;  Location: [facility] ENDOSCOPY;  Service: Endoscopy;  Laterality: N/A;  . HOT HEMOSTASIS N/A 12/04/2017   Procedure: HOT HEMOSTASIS (ARGON PLASMA COAGULATION/BICAP);  Surgeon: [name], MD;  Location: [place];  Service: Endoscopy;  Laterality: N/A;  . NASAL HEMORRHAGE CONTROL     "for bleeding"   . SAVORY DILATION  02/26/2011   Procedure: SAVORY DILATION;  Surgeon: [name], MD;  [name];  Service: Endoscopy;  Laterality: N/A;  c-arm needed  . SUBMUCOSAL INJECTION  12/04/2017   Procedure: SUBMUCOSAL INJECTION;  Surgeon: [name], MD;  Location[facility];  Service: Endoscopy;;     OB History   No obstetric history on file.      Home Medications    Prior to Admission medications   Medication Sig Start Date End Date Taking? Authorizing Provider  augmented betamethasone dipropionate (DIPROLENE-AF) 0.05 % cream  Apply 1 application topically daily. 11/16/17   [provider]  b complex vitamins capsule Take 1 capsule by mouth daily. 10/26/16   Alphonzo Grieve, MD  Blood Glucose Monitoring Suppl (ONETOUCH VERIO) w/Device KIT Use to check blood sugar up to 4 times a day 10/04/18   Modena Nunnery D, DO  calcium citrate-vitamin D (CITRACAL+D) 315-200 MG-UNIT tablet Take 1 tablet by mouth 2 (two) times daily. 12/31/15 08/18/18  Ledell Noss, MD  cetaphil (CETAPHIL) lotion Apply 1 application topically as needed for dry skin. 02/18/16   Rice, Resa Miner, MD  donepezil (ARICEPT) 10 MG tablet Take 1 tablet (10 mg total) by mouth at bedtime. 11/10/17   Ward Givens, NP  Elastic Bandages & Supports (MEDICAL COMPRESSION STOCKINGS) MISC Wear as much as possible while awake to reduce swelling 04/27/16   Minus Liberty, MD  FERROCITE 324 MG TABS tablet TAKE 1 TABLET BY MOUTH TWICE DAILY 09/27/17   Bartholomew Crews, MD  furosemide (LASIX) 20 MG tablet Take 1 tablet (20 mg total) by mouth daily. 08/18/18 08/18/19  [name], MD  glucose blood (ONETOUCH VERIO) test strip Use as instructed 4 times daily. E11.29, insulin requiring 10/04/18   Bloom[place], Carley D, DO  insulin degludec (TRESIBA FLEXTOUCH) 100 UNIT/ML SOPN FlexTouch Pen Inject 0.21 mLs (21 Units total) into the skin every evening. 09/12/18 11/23/18  Modena Nunnery D, DO  insulin lispro (HUMALOG KWIKPEN) 100 UNIT/ML KwikPen Inject 3 units under the skin three times daily before each meal. May increase up to 6 units before meals if needed. 08/31/18   Elayne Snare, MD  Insulin Pen Needle (B-D UF III MINI PEN NEEDLES) 31G X 5 MM MISC USE TO INJECT INSULIN  3 TIMES DAILY 08/08/18   [place]omfield, Carley D, DO  lactulose (CHRONULAC) 10 GM/15ML solution TAKE 15 ML BY MOUTH TWO TIMES DAILY 11/18/17   [place]field, Carley D, DO  levETIRAcetam (KEPPRA) 500 MG tablet Take 1 tablet (500 mg total) by mouth 2 (two) times daily. 11/10/17   Ward Givens, NP   memantine (NAMENDA) 10 MG tablet Take 1 tablet (10 mg total) by mouth 2 (two) times daily. 11/10/17   Ward Givens, NP  [facility] DELICA LANCETS FINE MISC Check blood sugar up to 4 times a day 09/20/17   Bartholomew Crews, MD  oxycodone-acetaminophen (PERCOCET) 2.5-325 MG tablet Take 1 tablet by mouth at bedtime as needed for pain. 10/12/18   Bloomfield, Carley D, DO  oxymetazoline (AFRIN) 0.05 % nasal spray Place 1 spray into both nostrils 2 (two) times daily as needed (Epistaxis). 10/05/13   Robbie Lis, MD  pantoprazole (PROTONIX) 40 MG tablet Take 1 tablet by mouth once daily 08/01/18   [place]omfield, Carley D, DO  potassium chloride (K-DUR) 10 MEQ tablet Take 1 tablet (10 mEq total) by mouth daily. 08/18/18   [name], MD  sodium chloride (OCEAN) 0.65 % SOLN nasal spray Place 1 spray into both nostrils as needed for congestion. 08/22/18   Lamptey, Myrene Galas, MD  triamcinolone ointment (KENALOG) 0.5 % APPLY 1 APPLICATION TOPICALLY TO RASH TWICE DAILY FOR ITCHING 02/07/18   Alphonzo Grieve, MD  TRULICITY 1.5 FU/9.3AT SOPN INJECT 1.5 MG INTO THE SKIN ONCE A WEEK. EVERY TUESDAY 09/13/18   Modena Nunnery D, DO    Family History Family History  Problem Relation Age of Onset  . Stroke Father   . Healthy Mother   . Breast cancer Other   . Diabetes Niece   . Malignant hyperthermia Neg Hx   . Seizures Neg Hx     Social History Social History   Tobacco Use  . Smoking status: Former Smoker    Packs/day: 1.00    Years: 20.00    Pack years: 20.00    Types: Cigarettes    Quit date: 02/10/1971    Years since quitting: 47.7  . Smokeless tobacco: Never Used  . Tobacco comment: 09/15/2012 "smoked 50-60 yr ago"  Substance Use Topics  . Alcohol use: No    Alcohol/week: 0.0 standard drinks  . Drug use: No     Allergies   Aspirin   Review of Systems Review of Systems  Constitutional: Negative for chills, fatigue and fever.  HENT: Negative for congestion, rhinorrhea and sore throat.    Respiratory: Negative for cough, shortness of breath and wheezing.   Cardiovascular: Negative for chest pain, palpitations and leg swelling.  Gastrointestinal: Negative for abdominal pain, constipation, diarrhea, nausea and vomiting.  Genitourinary: Negative for dysuria, frequency and urgency.  Musculoskeletal: Positive for myalgias.       Cramping in bilateral lower extremities   Skin: Negative.   Neurological: Negative for dizziness, weakness, light-headedness, numbness and headaches.  Hematological: Bruises/bleeds easily.  All other systems reviewed and are negative.    Physical Exam Updated Vital Signs LMP  (LMP Unknown)   Physical Exam Constitutional:      General: She is not  in acute distress.    Appearance: Normal appearance.  HENT:     Head: Normocephalic and atraumatic.  Cardiovascular:     Rate and Rhythm: Normal rate and regular rhythm.     Pulses: Normal pulses.     Heart sounds: Normal heart sounds. No murmur. No friction rub. No gallop.   Pulmonary:     Effort: Pulmonary effort is normal. No respiratory distress.     Breath sounds: Normal breath sounds. No wheezing, rhonchi or rales.  Abdominal:     General: Bowel sounds are normal. There is no distension.     Palpations: Abdomen is soft.     Tenderness: There is no abdominal tenderness. There is no guarding.  Musculoskeletal: Normal range of motion.        General: No swelling or tenderness.  Skin:    General: Skin is warm and dry.     Capillary Refill: Capillary refill takes less than 2 seconds.  Neurological:     General: No focal deficit present.     Mental Status: She is alert and oriented to person, place, and time.     Sensory: No sensory deficit.     Motor: No weakness.    ED Treatments / Results  Labs (all labs ordered are listed, but only abnormal results are displayed) Labs Reviewed - No data to display  EKG None  Radiology No results found.  Procedures Procedures (including  critical care time)  Medications Ordered in ED Medications - No data to display   Initial Impression / Assessment and Plan / ED Course  I have reviewed the triage vital signs and the nursing notes.  Pertinent labs & imaging results that were available during my care of the patient were reviewed by me and considered in my medical decision making (see chart for details).  Patient is a 78yo female with a known history of PVD and bilateral lower extremity pain for which she has had several visits with her PCP. She has been seen by vascular surgeon in 12/2017 and had ABIs significant for moderate PAD bilaterally. She presents to the ED today for bilateral leg cramps that she reports have been there "for a while" and are worsening. She reports that she takes her normal "cramping pills" but they do not provide much relief. On chart review, patient is taking b vitamins, calcium, and potassium pills which she may be referring to but is unsure. Physical examination is benign and vitals are wnl.   Patient given acetaminophen and dilaudid for pain control.  Labs drawn for CBC, CMP, and magnesium to assess for any electrolyte abnormalities that may be causing her symptoms.   Oncoming provider will follow up on labs and if stable, patient to be discharged home with PCP follow up.   Final Clinical Impressions(s) / ED Diagnoses   Final diagnoses:  None    ED Discharge Orders    None       Harvie Heck, MD 10/17/18 [place], DO 10/18/18 1521

## 2018-10-18 ENCOUNTER — Encounter (HOSPITAL_COMMUNITY): Payer: Self-pay | Admitting: Family Medicine

## 2018-10-18 ENCOUNTER — Other Ambulatory Visit: Payer: Self-pay

## 2018-10-18 DIAGNOSIS — E1165 Type 2 diabetes mellitus with hyperglycemia: Secondary | ICD-10-CM

## 2018-10-18 DIAGNOSIS — I5032 Chronic diastolic (congestive) heart failure: Secondary | ICD-10-CM | POA: Diagnosis not present

## 2018-10-18 DIAGNOSIS — E875 Hyperkalemia: Secondary | ICD-10-CM | POA: Diagnosis present

## 2018-10-18 DIAGNOSIS — N183 Chronic kidney disease, stage 3 (moderate): Secondary | ICD-10-CM

## 2018-10-18 DIAGNOSIS — E1129 Type 2 diabetes mellitus with other diabetic kidney complication: Secondary | ICD-10-CM | POA: Diagnosis not present

## 2018-10-18 LAB — CBC WITH DIFFERENTIAL/PLATELET
Abs Immature Granulocytes: 0.02 10*3/uL (ref 0.00–0.07)
Basophils Absolute: 0 10*3/uL (ref 0.0–0.1)
Basophils Relative: 1 %
Eosinophils Absolute: 0.6 10*3/uL — ABNORMAL HIGH (ref 0.0–0.5)
Eosinophils Relative: 9 %
HCT: 39 % (ref 36.0–46.0)
Hemoglobin: 13 g/dL (ref 12.0–15.0)
Immature Granulocytes: 0 %
Lymphocytes Relative: 18 %
Lymphs Abs: 1.1 10*3/uL (ref 0.7–4.0)
MCH: 31.8 pg (ref 26.0–34.0)
MCHC: 33.3 g/dL (ref 30.0–36.0)
MCV: 95.4 fL (ref 80.0–100.0)
Monocytes Absolute: 0.6 10*3/uL (ref 0.1–1.0)
Monocytes Relative: 11 %
Neutro Abs: 3.6 10*3/uL (ref 1.7–7.7)
Neutrophils Relative %: 61 %
Platelets: 161 10*3/uL (ref 150–400)
RBC: 4.09 MIL/uL (ref 3.87–5.11)
RDW: 13.4 % (ref 11.5–15.5)
WBC: 5.9 10*3/uL (ref 4.0–10.5)
nRBC: 0 % (ref 0.0–0.2)

## 2018-10-18 LAB — BASIC METABOLIC PANEL
Anion gap: 8 (ref 5–15)
BUN: 31 mg/dL — ABNORMAL HIGH (ref 8–23)
CO2: 29 mmol/L (ref 22–32)
Calcium: 9.5 mg/dL (ref 8.9–10.3)
Chloride: 107 mmol/L (ref 98–111)
Creatinine, Ser: 1.44 mg/dL — ABNORMAL HIGH (ref 0.44–1.00)
GFR calc Af Amer: 40 mL/min — ABNORMAL LOW (ref >60)
GFR calc non Af Amer: 35 mL/min — ABNORMAL LOW (ref >60)
Glucose, Bld: 64 mg/dL — ABNORMAL LOW (ref 70–99)
Potassium: 5.4 mmol/L — ABNORMAL HIGH (ref 3.5–5.1)
Sodium: 144 mmol/L (ref 135–145)

## 2018-10-18 LAB — COMPREHENSIVE METABOLIC PANEL
ALT: 36 U/L (ref 0–44)
AST: 48 U/L — ABNORMAL HIGH (ref 15–41)
Albumin: 3.9 g/dL (ref 3.5–5.0)
Alkaline Phosphatase: 115 U/L (ref 38–126)
Anion gap: 6 (ref 5–15)
BUN: 30 mg/dL — ABNORMAL HIGH (ref 8–23)
CO2: 25 mmol/L (ref 22–32)
Calcium: 9.4 mg/dL (ref 8.9–10.3)
Chloride: 108 mmol/L (ref 98–111)
Creatinine, Ser: 1.29 mg/dL — ABNORMAL HIGH (ref 0.44–1.00)
GFR calc Af Amer: 46 mL/min — ABNORMAL LOW (ref >60)
GFR calc non Af Amer: 40 mL/min — ABNORMAL LOW (ref >60)
Glucose, Bld: 90 mg/dL (ref 70–99)
Potassium: 6.1 mmol/L — ABNORMAL HIGH (ref 3.5–5.1)
Sodium: 139 mmol/L (ref 135–145)
Total Bilirubin: 0.6 mg/dL (ref 0.3–1.2)
Total Protein: 7.1 g/dL (ref 6.5–8.1)

## 2018-10-18 LAB — GLUCOSE, CAPILLARY
Glucose-Capillary: 125 mg/dL — ABNORMAL HIGH (ref 70–99)
Glucose-Capillary: 155 mg/dL — ABNORMAL HIGH (ref 70–99)
Glucose-Capillary: 161 mg/dL — ABNORMAL HIGH (ref 70–99)

## 2018-10-18 LAB — CREATININE, SERUM
Creatinine, Ser: 1.37 mg/dL — ABNORMAL HIGH (ref 0.44–1.00)
GFR calc Af Amer: 43 mL/min — ABNORMAL LOW (ref >60)
GFR calc non Af Amer: 37 mL/min — ABNORMAL LOW (ref >60)

## 2018-10-18 LAB — SARS CORONAVIRUS 2 (TAT 6-24 HRS): SARS Coronavirus 2: NEGATIVE

## 2018-10-18 LAB — POTASSIUM: Potassium: 4.3 mmol/L (ref 3.5–5.1)

## 2018-10-18 LAB — CBG MONITORING, ED
Glucose-Capillary: 80 mg/dL (ref 70–99)
Glucose-Capillary: 66 mg/dL — ABNORMAL LOW (ref 70–99)

## 2018-10-18 MED ORDER — SODIUM CHLORIDE 0.9% FLUSH: 3.0000 mL | Freq: Two times a day (BID) | INTRAVENOUS | Status: DC

## 2018-10-18 MED ORDER — DEXTROSE 50 % IV SOLN
50.0000 mL | Freq: Once | INTRAVENOUS | Status: AC
  Administered 2018-10-18: 50 mL via INTRAVENOUS

## 2018-10-18 MED ORDER — SODIUM CHLORIDE 0.9 % IV SOLN: 1.0000 g | Freq: Once | INTRAVENOUS | Status: DC

## 2018-10-18 MED ORDER — ACETAMINOPHEN 650 MG RE SUPP: 650.0000 mg | Freq: Four times a day (QID) | RECTAL | Status: DC | PRN

## 2018-10-18 MED ORDER — OXYCODONE-ACETAMINOPHEN 2.5-325 MG PO TABS: 1.0000 | ORAL_TABLET | Freq: Every evening | ORAL | Status: DC | PRN

## 2018-10-18 MED ORDER — INSULIN ASPART 100 UNIT/ML ~~LOC~~ SOLN
0.0000 [IU] | Freq: Three times a day (TID) | SUBCUTANEOUS | Status: DC
  Administered 2018-10-18: 2 [IU] via SUBCUTANEOUS
  Administered 2018-10-18: 1 [IU] via SUBCUTANEOUS
  Administered 2018-10-18: 2 [IU] via SUBCUTANEOUS
  Filled 2018-10-18: qty 0.09

## 2018-10-18 MED ORDER — PANTOPRAZOLE SODIUM 40 MG PO TBEC
40.0000 mg | DELAYED_RELEASE_TABLET | Freq: Every day | ORAL | Status: DC
  Administered 2018-10-18: 40 mg via ORAL
  Filled 2018-10-18: qty 1

## 2018-10-18 MED ORDER — ACETAMINOPHEN 325 MG PO TABS: 325.0000 mg | ORAL_TABLET | Freq: Every evening | ORAL | Status: DC | PRN

## 2018-10-18 MED ORDER — LEVETIRACETAM 500 MG PO TABS
500.0000 mg | ORAL_TABLET | Freq: Two times a day (BID) | ORAL | Status: DC
  Administered 2018-10-18: 500 mg via ORAL
  Filled 2018-10-18: qty 1

## 2018-10-18 MED ORDER — OXYCODONE HCL 5 MG PO TABS: 2.5000 mg | ORAL_TABLET | Freq: Every evening | ORAL | Status: DC | PRN

## 2018-10-18 MED ORDER — ONDANSETRON HCL 4 MG PO TABS: 4.0000 mg | ORAL_TABLET | Freq: Four times a day (QID) | ORAL | Status: DC | PRN

## 2018-10-18 MED ORDER — ACETAMINOPHEN 325 MG PO TABS: 650.0000 mg | ORAL_TABLET | Freq: Four times a day (QID) | ORAL | Status: DC | PRN

## 2018-10-18 MED ORDER — INSULIN ASPART 100 UNIT/ML ~~LOC~~ SOLN
0.0000 [IU] | Freq: Every day | SUBCUTANEOUS | Status: DC
  Filled 2018-10-18: qty 0.05

## 2018-10-18 MED ORDER — INSULIN GLARGINE 100 UNIT/ML ~~LOC~~ SOLN
10.0000 [IU] | Freq: Every day | SUBCUTANEOUS | Status: DC
  Filled 2018-10-18: qty 0.1

## 2018-10-18 MED ORDER — FUROSEMIDE 40 MG PO TABS
20.0000 mg | ORAL_TABLET | Freq: Every day | ORAL | Status: DC
  Administered 2018-10-18: 09:00:00 20 mg via ORAL
  Filled 2018-10-18: qty 1

## 2018-10-18 MED ORDER — ONDANSETRON HCL 4 MG/2ML IJ SOLN: 4.0000 mg | Freq: Four times a day (QID) | INTRAMUSCULAR | Status: DC | PRN

## 2018-10-18 MED ORDER — HEPARIN SODIUM (PORCINE) 5000 UNIT/ML IJ SOLN
5000.0000 [IU] | Freq: Three times a day (TID) | INTRAMUSCULAR | Status: DC
  Administered 2018-10-18 (×2): 5000 [IU] via SUBCUTANEOUS
  Filled 2018-10-18 (×2): qty 1

## 2018-10-18 MED ORDER — SODIUM ZIRCONIUM CYCLOSILICATE 10 G PO PACK
10.0000 g | PACK | ORAL | Status: AC
  Administered 2018-10-18: 10 g via ORAL
  Filled 2018-10-18: qty 1

## 2018-10-18 MED ORDER — SODIUM BICARBONATE 8.4 % IV SOLN
50.0000 meq | Freq: Once | INTRAVENOUS | Status: AC
  Administered 2018-10-18: 03:00:00 50 meq via INTRAVENOUS
  Filled 2018-10-18: qty 50

## 2018-10-18 MED ORDER — SODIUM CHLORIDE 0.9% FLUSH
3.0000 mL | Freq: Two times a day (BID) | INTRAVENOUS | Status: DC
  Administered 2018-10-18: 3 mL via INTRAVENOUS

## 2018-10-18 MED ORDER — SODIUM CHLORIDE 0.9 % IV SOLN: 250.0000 mL | INTRAVENOUS | Status: DC | PRN

## 2018-10-18 MED ORDER — HEPARIN SOD (PORK) LOCK FLUSH 100 UNIT/ML IV SOLN
500.0000 [IU] | INTRAVENOUS | Status: AC | PRN
  Administered 2018-10-18: 500 [IU]

## 2018-10-18 MED ORDER — SODIUM CHLORIDE 0.9 % IV SOLN: INTRAVENOUS | Status: DC

## 2018-10-18 MED ORDER — CALCIUM GLUCONATE-NACL 1-0.675 GM/50ML-% IV SOLN
1.0000 g | Freq: Once | INTRAVENOUS | Status: AC
  Administered 2018-10-18: 03:00:00 1000 mg via INTRAVENOUS
  Filled 2018-10-18: qty 50

## 2018-10-18 MED ORDER — SODIUM CHLORIDE 0.9 % IV SOLN
1.0000 g | Freq: Once | INTRAVENOUS | Status: DC
  Filled 2018-10-18: qty 10

## 2018-10-18 MED ORDER — MEMANTINE HCL 10 MG PO TABS
10.0000 mg | ORAL_TABLET | Freq: Two times a day (BID) | ORAL | Status: DC
  Administered 2018-10-18: 09:00:00 10 mg via ORAL
  Filled 2018-10-18: qty 1

## 2018-10-18 MED ORDER — POLYETHYLENE GLYCOL 3350 17 G PO PACK: 17.0000 g | PACK | Freq: Every day | ORAL | Status: DC | PRN

## 2018-10-18 MED ORDER — SODIUM CHLORIDE 0.9% FLUSH: 3.0000 mL | INTRAVENOUS | Status: DC | PRN

## 2018-10-18 MED ORDER — DONEPEZIL HCL 5 MG PO TABS: 10.0000 mg | ORAL_TABLET | Freq: Every day | ORAL | Status: DC

## 2018-10-18 NOTE — TOC Progression Note (Addendum)
Transition of Care [facility]) - Progression Note    Patient Details  Name: ZAIAH CREDEUR MRN: [id number] Date of Birth: [birth year]  Transition of Care [facility]) CM/SW Contact  Purcell Mouton, RN Phone Number: 10/18/2018, 4:41 PM  Clinical Narrative:    [facility] will follow her home. Pt is Aware.         Expected Discharge Plan and Services           Expected Discharge Date: 10/18/18                                     Social Determinants of Health (SDOH) Interventions    Readmission Risk Interventions No flowsheet data found.

## 2018-10-18 NOTE — Evaluation (Signed)
Physical Therapy Evaluation Patient Details Name: [name] MRN: [id number] DOB: 08/18/1940 Today's Date: 10/18/2018   History of Present Illness  78 yo female admitted to ED on 9/7 for bilateral LE cramping and hpyerkalemia. Pt with history of moderate PAD. PMH includes anemia, diastolic HF, GIB, gastric AV malformation, HTN, HLD, osler-weber-rendu syndrome, seizures, DMII, osteopenia, PAF, CKD, dementia.  Clinical Impression  Pt presents with generalized weakness, mild unsteadiness in standing, increased time and effort to perform mobility tasks, and decreased activity tolerance due to weakness. Pt to benefit from acute PT to address deficits. Pt ambulated hallway distance with min guard to supervision level of assist, verbal cuing for safety with AD use. PT recommending HHPT to address pt deficits post-acutely, pt safe to d/c home from PT standpoint with the assist of her husband as needed. PT to progress mobility as tolerated, and will continue to follow acutely.      Follow Up Recommendations Home health PT;Supervision for mobility/OOB    Equipment Recommendations  3in1 (PT)    Recommendations for Other Services       Precautions / Restrictions Precautions Precautions: Fall(moderate) Restrictions Weight Bearing Restrictions: No      Mobility  Bed Mobility Overal bed mobility: Needs Assistance Bed Mobility: Supine to Sit     Supine to sit: Supervision     General bed mobility comments: supervision for safety, increased time to scoot to EOB.  Transfers Overall transfer level: Needs assistance Equipment used: Rolling walker (2 wheeled) Transfers: Sit to/from Stand Sit to Stand: Min guard         General transfer comment: Min guard for safety, verbal cuing for hand placement pushing from bed vs RW when rising.  Ambulation/Gait Ambulation/Gait assistance: Supervision;Min guard Gait Distance (Feet): 100 Feet Assistive device: Rolling walker (2 wheeled) Gait  Pattern/deviations: Step-through pattern;Decreased stride length;Trunk flexed Gait velocity: decr   General Gait Details: Min guard to supervision for safety, verbal cuing for placement in RW.  Stairs            Wheelchair Mobility    Modified Rankin (Stroke Patients Only)       Balance Overall balance assessment: Needs assistance Sitting-balance support: No upper extremity supported Sitting balance-Leahy Scale: Good     Standing balance support: Bilateral upper extremity supported Standing balance-Leahy Scale: Fair Standing balance comment: able to stand without UE support, requires RW for dynamic standing balance. Pt with posterior unsteadiness when washing hands at sink.                             Pertinent Vitals/Pain Pain Assessment: No/denies pain Pain Score: 0-No pain Pain Intervention(s): Monitored during session    Home Living Family/patient expects to be discharged to:: Private residence Living Arrangements: Spouse/significant other Available Help at Discharge: Family;Available 24 hours/day Type of Home: House Home Access: Ramped entrance     Home Layout: One level Home Equipment: Walker - 4 wheels;Cane - single point;Shower seat      Prior Function Level of Independence: Independent with assistive device(s)         Comments: pt reports using cane and rollator for ambulation, states she does ADLs for herself but does get outside assist for cooking. Pt states she does not have a home health aide.     Hand Dominance   Dominant Hand: Right    Extremity/Trunk Assessment   Upper Extremity Assessment Upper Extremity Assessment: Generalized weakness    Lower Extremity Assessment  Lower Extremity Assessment: Generalized weakness    Cervical / Trunk Assessment Cervical / Trunk Assessment: Kyphotic  Communication   Communication: No difficulties  Cognition Arousal/Alertness: Awake/alert Behavior During Therapy: WFL for tasks  assessed/performed Overall Cognitive Status: History of cognitive impairments - at baseline                                 General Comments: pt with history of dementia, pt completely appropriate and pleasant with PT.      General Comments      Exercises     Assessment/Plan    PT Assessment Patient needs continued PT services  PT Problem List Decreased strength;Decreased mobility;Decreased range of motion;Decreased activity tolerance;Decreased balance;Decreased knowledge of use of DME       PT Treatment Interventions DME instruction;Therapeutic activities;Gait training;Therapeutic exercise;Patient/family education;Balance training;Functional mobility training    PT Goals (Current goals can be found in the Care Plan section)  Acute Rehab PT Goals Patient Stated Goal: go home to husband PT Goal Formulation: With patient Time For Goal Achievement: 11/01/18 Potential to Achieve Goals: Good    Frequency Min 3X/week   Barriers to discharge        Co-evaluation               AM-PAC PT "6 Clicks" Mobility  Outcome Measure Help needed turning from your back to your side while in a flat bed without using bedrails?: A [place] Help needed moving from lying on your back to sitting on the side of a flat bed without using bedrails?: A [place]isbecker Help needed moving to and from a bed to a chair (including a wheelchair)?: A Vanderhoef Help needed standing up from a chair using your arms (e.g., wheelchair or bedside chair)?: A Zulueta Help needed to walk in hospital room?: A Dahmer Help needed climbing 3-5 steps with a railing? : A Nevin 6 Click Score: 18    End of Session Equipment Utilized During Treatment: Gait belt Activity Tolerance: Patient tolerated treatment well Patient left: in chair;with chair alarm set;with call bell/phone within reach Nurse Communication: Mobility status PT Visit Diagnosis: Muscle weakness (generalized) (M62.81);Unsteadiness on feet  (R26.81);Other abnormalities of gait and mobility (R26.89)    Time: 0233-4356 PT Time Calculation (min) (ACUTE ONLY): 26 min   Charges:   PT Evaluation $PT Eval Low Complexity: 1 Low PT Treatments $Gait Training: 8-22 mins        Julien Girt, PT Acute Rehabilitation Services Pager [phone]  Office [phone]  Auriella Wieand D Elonda Husky 10/18/2018, 1:38 PM

## 2018-10-18 NOTE — ED Provider Notes (Signed)
[facility] DEPT Provider Note   CSN: [id number] Arrival date & time: 10/17/18  2122     History   Chief Complaint Chief Complaint  Patient presents with  . Leg Pain    HPI [name] is a 78 y.o. female.     The history is provided by the patient.  Leg Pain Location:  Leg Injury: no   Leg location:  L leg and R leg Pain details:    Radiates to:  Does not radiate   Severity:  Moderate   Onset quality:  Gradual   Timing:  Constant   Progression:  Worsening Chronicity:  Chronic Prior injury to area:  No Relieved by:  Nothing Worsened by:  Nothing Ineffective treatments:  None tried Associated symptoms: no fever and no numbness   Patient with PVD with leg pain.  Denies CP, SOB, n/v/d.  No diaphoresis.  No neck or shoulder pain.  No f/c/r.    Past Medical History:  Diagnosis Date  . Chronic anemia   . Chronic diastolic CHF (congestive heart failure) ([place]) 10/03/2013  . Chronic GI bleeding    [name] 11/29/2014  . Family history of anesthesia complication    "niece has a hard time coming out" (09/15/2012)  . Frequent nosebleeds    chronic  . Gastric AV malformation    [name] 11/29/2014  . GERD (gastroesophageal reflux disease)   . 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 History of blood transfusion "several"  . History of epistaxis   . HTN (hypertension), benign 03/02/2012  . Hyperlipidemia   . Iron deficiency anemia    chronic infusions"  . Lichen planus    Both lower extremities  . 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 Overgrown toenails 03/18/2017  . Pneumonia 1990's X 2  . Pulmonary HTN ([place]) 04/01/2017   PASP 47mHg on echo 09/2016 and 42mg by echo 2019  . Seizures (HCSan Antonio8/2016  . Symptomatic anemia 11/29/2014  . Telangiectasia    Gastric   . 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    Patient Active Problem List   Diagnosis Date Noted  . Hyperkalemia 10/18/2018   . Port-A-Cath in place 05/23/2018  . Iron deficiency anemia due to chronic blood loss 04/19/2018  . Acute right hip pain 03/10/2018  . Hyperpigmented skin lesion 02/07/2018  . Leg weakness, bilateral 12/23/2017  . Hematemesis 12/04/2017  . Pain localized to upper abdomen 10/05/2017  . Lower extremity pain, bilateral 07/15/2017  . Pulmonary HTN (HCOpal02/21/2019  . Heart murmur 04/01/2017  . Osteopenia after menopause 01/13/2017  . Epistaxis 07/30/2016  . Venous insufficiency of both lower extremities 04/27/2016  . Anemia due to chronic blood loss   . Dry skin 02/17/2016  . Peripheral vascular disease (HCBayonne11/20/2017  . Pain in joint of right shoulder 10/22/2015  . PAF (paroxysmal atrial fibrillation) (HCRay[place]08/20/2017  . Lichen planus 0695/10/3265. Healthcare maintenance 06/20/2015  . Seborrheic keratosis 06/20/2015  . Memory loss 01/30/2015  . Simple febrile convulsions (HCValmeyer09/12/2014  . Hyperammonemia (HCBellamy08/24/2016  . CKD (chronic kidney disease), stage III ([facility]CR[place]  . Chronic diastolic CHF (congestive heart failure) (HCRosedale08/25/2015  . DM (diabetes mellitus), type 2, uncontrolled, with renal complications (HCLa Grange Park0812/45/8099. HTN (hypertension), benign 03/02/2012  . Fatigue 07/10/2011  . 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 HHT (hereditary hemorrhagic telangiectasia) (HCLexington12/23/2012    Past Surgical History:  Procedure Laterality Date  . CATARACT EXTRACTION     "I think it was just one eye"  . ESOPHAGOGASTRODUODENOSCOPY  02/26/2011   Procedure: ESOPHAGOGASTRODUODENOSCOPY (EGD);  Surgeon: [name], MD;  Location: [name];  Service: Endoscopy;  Laterality: N/A;  . ESOPHAGOGASTRODUODENOSCOPY N/A 11/08/2012   Procedure: ESOPHAGOGASTRODUODENOSCOPY (EGD);  Surgeon: [name], MD;  Location: [name];  Service: Endoscopy;  Laterality: N/A;  . ESOPHAGOGASTRODUODENOSCOPY N/A 10/04/2013   Procedure: ESOPHAGOGASTRODUODENOSCOPY (EGD);  Surgeon: Winfield Cunas., MD;   Location: [name];  Service: Endoscopy;  Laterality: N/A;  with APC on stand-by  . ESOPHAGOGASTRODUODENOSCOPY N/A 07/06/2014   Procedure: ESOPHAGOGASTRODUODENOSCOPY (EGD);  Surgeon: Clarene Essex, MD;  Location: [name];  Service: Endoscopy;  Laterality: N/A;  . ESOPHAGOGASTRODUODENOSCOPY N/A 09/05/2014   Procedure: ESOPHAGOGASTRODUODENOSCOPY (EGD);  Surgeon: Laurence Spates, MD;  Location: [name];  Service: Endoscopy;  Laterality: N/A;  APC on standby to control bleeding  . ESOPHAGOGASTRODUODENOSCOPY N/A 11/29/2014   Procedure: ESOPHAGOGASTRODUODENOSCOPY (EGD);  Surgeon: [name], MD;  Location: [facility] ENDOSCOPY;  Service: Endoscopy;  Laterality: N/A;  . ESOPHAGOGASTRODUODENOSCOPY N/A 09/28/2015   Procedure: ESOPHAGOGASTRODUODENOSCOPY (EGD);  Surgeon: Clarene Essex, MD;  Location: [facility] ENDOSCOPY;  Service: Endoscopy;  Laterality: N/A;  . ESOPHAGOGASTRODUODENOSCOPY (EGD) WITH PROPOFOL N/A 12/04/2017   Procedure: ESOPHAGOGASTRODUODENOSCOPY (EGD) WITH PROPOFOL;  Surgeon: [name], MD;  Location: [facility];  Service: Endoscopy;  Laterality: N/A;  . ESOPHAGOGASTRODUODENOSCOPY ENDOSCOPY  08/19/2006   with laser treatment  . HOT HEMOSTASIS N/A 07/06/2014   Procedure: HOT HEMOSTASIS (ARGON PLASMA COAGULATION/BICAP);  Surgeon: Clarene Essex, MD;  Location: [name];  Service: Endoscopy;  Laterality: N/A;  . HOT HEMOSTASIS N/A 09/28/2015   Procedure: HOT HEMOSTASIS (ARGON PLASMA COAGULATION/BICAP);  Surgeon: Clarene Essex, MD;  Location[facility] ENDOSCOPY;  Service: Endoscopy;  Laterality: N/A;  . HOT HEMOSTASIS N/A 12/04/2017   Procedure: HOT HEMOSTASIS (ARGON PLASMA COAGULATION/BICAP);  Surgeon: [name], MD;  Location: [facility];  Service: Endoscopy;  Laterality: N/A;  . NASAL HEMORRHAGE CONTROL     "for bleeding"   . SAVORY DILATION  02/26/2011   Procedure: SAVORY DILATION;  Surgeon: [name], MD;  Location: [name];  Service: Endoscopy;  Laterality: N/A;  c-arm needed   . SUBMUCOSAL INJECTION  12/04/2017   Procedure: SUBMUCOSAL INJECTION;  Surgeon: [name], MD;  Location: [facility] ENDOSCOPY;  Service: Endoscopy;;     OB History   No obstetric history on file.      Home Medications    Prior to Admission medications   Medication Sig Start Date End Date Taking? Authorizing Provider  b complex vitamins capsule Take 1 capsule by mouth daily. 10/26/16  Yes Alphonzo Grieve, MD  Blood Glucose Monitoring Suppl (ONETOUCH VERIO) w/Device KIT Use to check blood sugar up to 4 times a day 10/04/18  Yes Bloomfield, Carley D, DO  calcium-vitamin D (OSCAL WITH D) 500-200 MG-UNIT tablet Take 1 tablet by mouth 2 (two) times daily.   Yes [provider]  cetaphil (CETAPHIL) lotion Apply 1 application topically as needed for dry skin. 02/18/16  Yes Rice, Resa Miner, MD  donepezil (ARICEPT) 10 MG tablet Take 1 tablet (10 mg total) by mouth at bedtime. 11/10/17  Yes Ward Givens, NP  Elastic Bandages & Supports ([place]) [place] Wear as much as possible while awake to reduce swelling 04/27/16  Yes Minus Liberty, MD  FERROCITE 324 MG TABS tablet TAKE 1 TABLET BY MOUTH TWICE DAILY Patient taking differently: Take 1 tablet by mouth 2 (two)  times daily.  09/27/17  Yes Bartholomew Crews, MD  furosemide (LASIX) 20 MG tablet Take 1 tablet (20 mg total) by mouth daily. 08/18/18 08/18/19 Yes Turner, Traci R, MD  glucose blood (ONETOUCH VERIO) test strip Use as instructed 4 times daily. E11.29, insulin requiring 10/04/18  Yes Bloomfield, Carley D, DO  insulin degludec (TRESIBA FLEXTOUCH) 100 UNIT/ML SOPN FlexTouch Pen Inject 0.21 mLs (21 Units total) into the skin every evening. 09/12/18 11/23/18 Yes Bloomfield, Carley D, DO  insulin lispro (HUMALOG KWIKPEN) 100 UNIT/ML KwikPen Inject 3 units under the skin three times daily before each meal. May increase up to 6 units before meals if needed. 08/31/18  Yes Elayne Snare, MD  Insulin Pen Needle (B-D UF III  MINI PEN NEEDLES) 31G X 5 MM MISC USE TO INJECT INSULIN  3 TIMES DAILY 08/08/18  Yes Bloomfield, Carley D, DO  levETIRAcetam (KEPPRA) 500 MG tablet Take 1 tablet (500 mg total) by mouth 2 (two) times daily. 11/10/17  Yes Ward Givens, NP  memantine (NAMENDA) 10 MG tablet Take 1 tablet (10 mg total) by mouth 2 (two) times daily. 11/10/17  Yes Ward Givens, NP  ONETOUCH DELICA LANCETS FINE MISC Check blood sugar up to 4 times a day 09/20/17  Yes Bartholomew Crews, MD  oxycodone-acetaminophen (PERCOCET) 2.5-325 MG tablet Take 1 tablet by mouth at bedtime as needed for pain. 10/12/18  Yes Bloomfield, Carley D, DO  oxymetazoline (AFRIN) 0.05 % nasal spray Place 1 spray into both nostrils 2 (two) times daily as needed (Epistaxis). 10/05/13  Yes Robbie Lis, MD  pantoprazole (PROTONIX) 40 MG tablet Take 1 tablet by mouth once daily 08/01/18  Yes Bloomfield, Carley D, DO  potassium chloride (K-DUR) 10 MEQ tablet Take 10 mEq by mouth daily.   Yes [provider]  sodium chloride (OCEAN) 0.65 % SOLN nasal spray Place 1 spray into both nostrils as needed for congestion. 08/22/18  Yes Lamptey, Myrene Galas, MD  triamcinolone ointment (KENALOG) 0.5 % APPLY 1 APPLICATION TOPICALLY TO RASH TWICE DAILY FOR ITCHING Patient taking differently: Apply 1 application topically 2 (two) times daily as needed (itching).  02/07/18  Yes Alphonzo Grieve, MD  TRULICITY 1.5 XL/2.4MW SOPN INJECT 1.5 MG INTO THE SKIN ONCE A WEEK. EVERY TUESDAY Patient taking differently: Inject 1.5 mg as directed every Tuesday.  09/13/18  Yes Bloomfield, Carley D, DO  lactulose (CHRONULAC) 10 GM/15ML solution TAKE 15 ML BY MOUTH TWO TIMES DAILY Patient not taking: Reported on 10/18/2018 11/18/17   Modena Nunnery D, DO  potassium chloride (K-DUR) 10 MEQ tablet Take 1 tablet (10 mEq total) by mouth daily. 08/18/18 10/18/18  [name], MD    Family History Family History  Problem Relation Age of Onset  . Stroke Father   . Healthy  Mother   . Breast cancer Other   . Diabetes Niece   . Malignant hyperthermia Neg Hx   . Seizures Neg Hx     Social History Social History   Tobacco Use  . Smoking status: Former Smoker    Packs/day: 1.00    Years: 20.00    Pack years: 20.00    Types: Cigarettes    Quit date: 02/10/1971    Years since quitting: 47.7  . Smokeless tobacco: Never Used  . Tobacco comment: 09/15/2012 "smoked 50-60 yr ago"  Substance Use Topics  . Alcohol use: No    Alcohol/week: 0.0 standard drinks  . Drug use: No     Allergies   Aspirin  Review of Systems Review of Systems  Constitutional: Negative for diaphoresis and fever.  HENT: Negative for congestion.   Respiratory: Negative for shortness of breath.   Cardiovascular: Negative for chest pain.  Gastrointestinal: Negative for nausea and vomiting.  Musculoskeletal: Positive for arthralgias.  Skin: Negative for rash and wound.  Psychiatric/Behavioral: Negative for agitation.  All other systems reviewed and are negative.    Physical Exam Updated Vital Signs BP (!) 159/62   Pulse (!) 49   Temp 98.7 F (37.1 C)   Resp 12   LMP  (LMP Unknown)   SpO2 98%   Physical Exam Vitals signs and nursing note reviewed.  Constitutional:      Appearance: Normal appearance.  HENT:     Head: Normocephalic and atraumatic.     Nose: Nose normal.  Eyes:     Extraocular Movements: Extraocular movements intact.  Neck:     Musculoskeletal: Normal range of motion and neck supple.  Cardiovascular:     Rate and Rhythm: Regular rhythm. Bradycardia present.     Pulses: Normal pulses.     Heart sounds: Normal heart sounds.  Pulmonary:     Effort: Pulmonary effort is normal.     Breath sounds: Normal breath sounds.  Abdominal:     General: Abdomen is flat. Bowel sounds are normal.     Tenderness: There is no abdominal tenderness.  Musculoskeletal: Normal range of motion.  Skin:    General: Skin is warm and dry.     Capillary Refill: Capillary  refill takes less than 2 seconds.  Neurological:     General: No focal deficit present.     Mental Status: She is alert.  Psychiatric:        Mood and Affect: Mood normal.      ED Treatments / Results  Labs (all labs ordered are listed, but only abnormal results are displayed) Results for orders placed or performed during the hospital encounter of 10/17/18  Comprehensive metabolic panel  Result Value Ref Range   Sodium 140 135 - 145 mmol/L   Potassium 5.9 (H) 3.5 - 5.1 mmol/L   Chloride 107 98 - 111 mmol/L   CO2 24 22 - 32 mmol/L   Glucose, Bld 125 (H) 70 - 99 mg/dL   BUN 30 (H) 8 - 23 mg/dL   Creatinine, Ser 1.34 (H) 0.44 - 1.00 mg/dL   Calcium 8.9 8.9 - 10.3 mg/dL   Total Protein 7.0 6.5 - 8.1 g/dL   Albumin 3.7 3.5 - 5.0 g/dL   AST 48 (H) 15 - 41 U/L   ALT 37 0 - 44 U/L   Alkaline Phosphatase 114 38 - 126 U/L   Total Bilirubin 0.4 0.3 - 1.2 mg/dL   GFR calc non Af Amer 38 (L) >60 mL/min   GFR calc Af Amer 44 (L) >60 mL/min   Anion gap 9 5 - 15  Magnesium  Result Value Ref Range   Magnesium 1.8 1.7 - 2.4 mg/dL  CBC  Result Value Ref Range   WBC 5.4 4.0 - 10.5 K/uL   RBC 4.03 3.87 - 5.11 MIL/uL   Hemoglobin 12.9 12.0 - 15.0 g/dL   HCT 38.3 36.0 - 46.0 %   MCV 95.0 80.0 - 100.0 fL   MCH 32.0 26.0 - 34.0 pg   MCHC 33.7 30.0 - 36.0 g/dL   RDW 13.2 11.5 - 15.5 %   Platelets 135 (L) 150 - 400 K/uL   nRBC 0.0 0.0 - 0.2 %  Comprehensive metabolic panel  Result Value Ref Range   Sodium 139 135 - 145 mmol/L   Potassium 6.1 (H) 3.5 - 5.1 mmol/L   Chloride 108 98 - 111 mmol/L   CO2 25 22 - 32 mmol/L   Glucose, Bld 90 70 - 99 mg/dL   BUN 30 (H) 8 - 23 mg/dL   Creatinine, Ser 1.29 (H) 0.44 - 1.00 mg/dL   Calcium 9.4 8.9 - 10.3 mg/dL   Total Protein 7.1 6.5 - 8.1 g/dL   Albumin 3.9 3.5 - 5.0 g/dL   AST 48 (H) 15 - 41 U/L   ALT 36 0 - 44 U/L   Alkaline Phosphatase 115 38 - 126 U/L   Total Bilirubin 0.6 0.3 - 1.2 mg/dL   GFR calc non Af Amer 40 (L) >60 mL/min   GFR  calc Af Amer 46 (L) >60 mL/min   Anion gap 6 5 - 15  POC CBG, ED  Result Value Ref Range   Glucose-Capillary 80 70 - 99 mg/dL  CBG monitoring, ED  Result Value Ref Range   Glucose-Capillary 66 (L) 70 - 99 mg/dL   *Note: Due to a large number of results and/or encounters for the requested time period, some results have not been displayed. A complete set of results can be found in Results Review.   No results found.  EKG EKG Interpretation  Date/Time:  Tuesday October 18 2018 02:15:01 EDT Ventricular Rate:  47 PR Interval:    QRS Duration: 72 QT Interval:  487 QTC Calculation: 431 R Axis:   20 Text Interpretation:  Slow sinus arrhythmia Abnormal R-wave progression, early transition Probable LVH with secondary repol abnrm Confirmed by Randal Buba, Cali Hope ([id number]) on 10/18/2018 2:20:50 AM   Radiology No results found.  Procedures Procedures (including critical care time)  Medications Ordered in ED Medications  0.9 %  sodium chloride infusion (has no administration in time range)  calcium gluconate 1 g/ 50 mL sodium chloride IVPB (has no administration in time range)  dextrose 50 % solution 50 mL (has no administration in time range)  acetaminophen (TYLENOL) tablet 650 mg (650 mg Oral Given 10/17/18 2248)  HYDROmorphone (DILAUDID) injection 0.5 mg (0.5 mg Intravenous Given 10/17/18 2248)  sodium bicarbonate injection 50 mEq (50 mEq Intravenous Given 10/18/18 0240)  sodium zirconium cyclosilicate (LOKELMA) packet 10 g (10 g Oral Given 10/18/18 0246)     MDM Reviewed: nursing note, vitals and previous chart Interpretation: labs and ECG Total time providing critical care: 30-74 minutes (hyperkalemia protocol initiated by me in the ED). This excludes time spent performing separately reportable procedures and services. Consults: admitting MD   CRITICAL CARE Performed by: Kaitrin Seybold K Melane Windholz-Rasch Total critical care time: 60 minutes Critical care time was exclusive of separately billable  procedures and treating other patients. Critical care was necessary to treat or prevent imminent or life-threatening deterioration. Critical care was time spent personally by me on the following activities: development of treatment plan with patient and/or surrogate as well as nursing, discussions with consultants, evaluation of patient's response to treatment, examination of patient, obtaining history from patient or surrogate, ordering and performing treatments and interventions, ordering and review of laboratory studies, ordering and review of radiographic studies, pulse oximetry and re-evaluation of patient's condition. Final Clinical Impressions(s) / ED Diagnoses   Final diagnoses:  Hyperkalemia  Pain in both lower extremities  Bradycardia    Admit for hyperkalemia and bradycardia.  Have canceled RX for potassium patient was getting as an outpatient as  this is likely the cause of the hyperkalemia.     Alvester Eads, MD 10/18/18 3358

## 2018-10-18 NOTE — Discharge Summary (Addendum)
Physician Discharge Summary  BERNEDETTE [name] ZPH:[id number] DOB: [birth year] DOA: 10/17/2018  PCP: Modena Nunnery D, DO  Admit date: 10/17/2018 Discharge date: 10/18/2018  Admitted From: Home Disposition: Home  Recommendations for Outpatient Follow-up:  1. Follow up with PCP in 1 week 2. Please obtain BMP/CBC in one week 3. Please follow up on the following pending results: None  Home Health: PT, OT Equipment/Devices: 3-in-1 commode  Discharge Condition: Stable CODE STATUS: Partial, DNI Diet recommendation: Heart healthy   Brief/Interim Summary:  Admission HPI written by [name] Bulls, MD   Chief Complaint: Leg cramps   HPI: [name] is a 78 y.o. female with medical history significant for dementia, chronic diastolic CHF, insulin-dependent diabetes mellitus, and chronic kidney disease stage III, now presenting to the emergency department for evaluation of bilateral leg cramping.  Family reports that this is been a chronic complaint for the patient, and has apparently been worse than usual over the past month or so.  Patient reports that both legs are involved, both upper and lower legs, denies any preceding fall or inciting event, describes this as cramping in character, intermittent, and severe at times.  She denies any recent fevers, chills, cough, shortness of breath, chest pain, or abdominal pain.  ED Course: Upon arrival to the ED, patient is found to be afebrile, saturating well on room air, bradycardic in the mid 50s, and with stable blood pressure.  EKG features a sinus arrhythmia with rate 47.  Chemistry panel is notable for a potassium of 6.1 and creatinine of 1.29, similar to priors.  CBC features a mild chronic thrombocytopenia.  Patient was treated with calcium, Lokelma, bicarbonate, and IV fluids in the emergency department and hospitalists are consulted for admission.    Hospital course:  Hyperkalemia Secondary to potassium intake. Patient with a peak  potassium of 6.1 with associated bradyarrhythmia. She was treated with calcium gluconate, Lokelma and IV fluids. Potassium levels decreased to 4.3 prior to discharge. Discontinued potassium supplementation on discharge. Although on Lasix, may not need extra supplementation in setting of CKD.  Leg cramping Chronic issue. Patient has a history of claudication. Recommend continued outpatient management. PT recommending home health, which was ordered.  Chronic diastolic heart failure Continue home Lasix. Potassium held. Stable.  CKD stage III Stable.  Discharge Diagnoses:  Principal Problem:   Hyperkalemia Active Problems:   DM (diabetes mellitus), type 2, uncontrolled, with renal complications ([name]   Chronic diastolic CHF (congestive heart failure) ([name]   CKD (chronic kidney disease), stage III [facility])    Discharge Instructions   Allergies as of 10/18/2018      Reactions   Aspirin Nausea And Vomiting      Medication List    STOP taking these medications   potassium chloride 10 MEQ tablet Commonly known as: K-DUR   potassium chloride 10 MEQ tablet Commonly known as: K-DUR     TAKE these medications   b complex vitamins capsule Take 1 capsule by mouth daily.   B-D UF III MINI PEN NEEDLES 31G X 5 MM Misc Generic drug: Insulin Pen Needle USE TO INJECT INSULIN  3 TIMES DAILY   calcium-vitamin D 500-200 MG-UNIT tablet Commonly known as: OSCAL WITH D Take 1 tablet by mouth 2 (two) times daily.   cetaphil lotion Apply 1 application topically as needed for dry skin.   donepezil 10 MG tablet Commonly known as: ARICEPT Take 1 tablet (10 mg total) by mouth at bedtime.   Ferrocite 324 (106  Fe) MG Tabs tablet Generic drug: Ferrous Fumarate TAKE 1 TABLET BY MOUTH TWICE DAILY   furosemide 20 MG tablet Commonly known as: LASIX Take 1 tablet (20 mg total) by mouth daily.   insulin lispro 100 UNIT/ML KwikPen Commonly known as: HumaLOG KwikPen Inject 3 units under the skin  three times daily before each meal. May increase up to 6 units before meals if needed.   lactulose 10 GM/15ML solution Commonly known as: CHRONULAC TAKE 15 ML BY MOUTH TWO TIMES DAILY   levETIRAcetam 500 MG tablet Commonly known as: KEPPRA Take 1 tablet (500 mg total) by mouth 2 (two) times daily.   Medical Compression Stockings Misc Wear as much as possible while awake to reduce swelling   memantine 10 MG tablet Commonly known as: NAMENDA Take 1 tablet (10 mg total) by mouth 2 (two) times daily.   OneTouch Delica Lancets Fine Misc Check blood sugar up to 4 times a day   OneTouch Verio test strip Generic drug: glucose blood Use as instructed 4 times daily. E11.29, insulin requiring   OneTouch Verio w/Device Kit Use to check blood sugar up to 4 times a day   oxycodone-acetaminophen 2.5-325 MG tablet Commonly known as: PERCOCET Take 1 tablet by mouth at bedtime as needed for pain.   oxymetazoline 0.05 % nasal spray Commonly known as: AFRIN Place 1 spray into both nostrils 2 (two) times daily as needed (Epistaxis).   pantoprazole 40 MG tablet Commonly known as: PROTONIX Take 1 tablet by mouth once daily   sodium chloride 0.65 % Soln nasal spray Commonly known as: OCEAN Place 1 spray into both nostrils as needed for congestion.   Tyler Aas FlexTouch 100 UNIT/ML Sopn FlexTouch Pen Generic drug: insulin degludec Inject 0.21 mLs (21 Units total) into the skin every evening.   triamcinolone ointment 0.5 % Commonly known as: KENALOG APPLY 1 APPLICATION TOPICALLY TO RASH TWICE DAILY FOR ITCHING What changed:   how much to take  how to take this  when to take this  reasons to take this  additional instructions   Trulicity 1.5 AY/3.0ZS Sopn Generic drug: Dulaglutide INJECT 1.5 MG INTO THE SKIN ONCE A WEEK. EVERY TUESDAY What changed: See the new instructions.            Durable Medical Equipment  (From admission, onward)         Start     Ordered    10/18/18 1612  For home use only DME Bedside commode  Once    Question:  Patient needs a bedside commode to treat with the following condition  Answer:  Fear for personal safety   10/18/18 1611          Allergies  Allergen Reactions  . Aspirin Nausea And Vomiting    Consultations:  None   Procedures/Studies:  No results found.   Subjective: No issues overnight.  Discharge Exam: Vitals:   10/18/18 0651 10/18/18 1347  BP: (!) 155/58 (!) 138/56  Pulse:  (!) 45  Resp:  16  Temp:  98.5 F (36.9 C)  SpO2:  93%   Vitals:   10/18/18 0531 10/18/18 0538 10/18/18 0651 10/18/18 1347  BP:  (!) 188/65 (!) 155/58 (!) 138/56  Pulse:  (!) 57  (!) 45  Resp:  (!) 22  16  Temp:  98.1 F (36.7 C)  98.5 F (36.9 C)  TempSrc:  Oral  Oral  SpO2:  100%  93%  Weight: 65.9 kg     Height: _0  (  1.6 m)       General: Pt is alert, awake, not in acute distress Cardiovascular: RRR, S1/S2 +, no rubs, no gallops Respiratory: CTA bilaterally, no wheezing, no rhonchi Abdominal: Soft, NT, ND, bowel sounds + Extremities: no edema, no cyanosis    The results of significant diagnostics from this hospitalization (including imaging, microbiology, ancillary and laboratory) are listed below for reference.     Microbiology: Recent Results (from the past 240 hour(s))  SARS CORONAVIRUS 2 (TAT 6-24 HRS) Nasopharyngeal Nasopharyngeal Swab     Status: None   Collection Time: 10/18/18  3:36 AM   Specimen: Nasopharyngeal Swab  Result Value Ref Range Status   SARS Coronavirus 2 NEGATIVE NEGATIVE Final    Comment: (NOTE) SARS-CoV-2 target nucleic acids are NOT DETECTED. The SARS-CoV-2 RNA is generally detectable in upper and lower respiratory specimens during the acute phase of infection. Negative results do not preclude SARS-CoV-2 infection, do not rule out co-infections with other pathogens, and should not be used as the sole basis for treatment or other patient management decisions. Negative  results must be combined with clinical observations, patient history, and epidemiological information. The expected result is Negative. Fact Sheet for Patients: SugarRoll.be Fact Sheet for Healthcare Providers: https://www.woods-mathews.com/ This test is not yet approved or cleared by the [place] FDA and  has been authorized for detection and/or diagnosis of SARS-CoV-2 by FDA under an Emergency Use Authorization (EUA). This EUA will remain  in effect (meaning this test can be used) for the duration of the COVID-19 declaration under Section 56 4(b)(1) of the Act, 21 U.S.C. section 360bbb-3(b)(1), unless the authorization is terminated or revoked sooner. Performed at [facility], [place] [address], [place], [place] [address]      Labs: BNP (last 3 results) No results for input(s): BNP in the last 8760 hours. Basic Metabolic Panel: Recent Labs  Lab 10/17/18 2210 10/18/18 0042 10/18/18 0300 10/18/18 0542  NA 140 139 144  --   K 5.9* 6.1* 5.4* 4.3  CL 107 108 107  --   CO2 _0 --   GLUCOSE 125* 90 64*  --   BUN 30* 30* 31*  --   CREATININE 1.34* 1.29* 1.44* 1.37*  CALCIUM 8.9 9.4 9.5  --   MG 1.8  --   --   --    Liver Function Tests: Recent Labs  Lab 10/17/18 2210 10/18/18 0042  AST 48* 48*  ALT 37 36  ALKPHOS 114 115  BILITOT 0.4 0.6  PROT 7.0 7.1  ALBUMIN 3.7 3.9   No results for input(s): LIPASE, AMYLASE in the last 168 hours. No results for input(s): AMMONIA in the last 168 hours. CBC: Recent Labs  Lab 10/17/18 2232 10/18/18 0300  WBC 5.4 5.9  NEUTROABS  --  3.6  HGB 12.9 13.0  HCT 38.3 39.0  MCV 95.0 95.4  PLT 135* 161   Cardiac Enzymes: No results for input(s): CKTOTAL, CKMB, CKMBINDEX, TROPONINI in the last 168 hours. BNP: Invalid input(s): POCBNP CBG: Recent Labs  Lab 10/18/18 0104 10/18/18 0234 10/18/18 0812 10/18/18 1149  GLUCAP 80 66* 125* 155*   D-Dimer No results for  input(s): DDIMER in the last 72 hours. Hgb A1c No results for input(s): HGBA1C in the last 72 hours. Lipid Profile No results for input(s): CHOL, HDL, LDLCALC, TRIG, CHOLHDL, LDLDIRECT in the last 72 hours. Thyroid function studies No results for input(s): TSH, T4TOTAL, T3FREE, THYROIDAB in the last 72 hours.  Invalid input(s):  FREET3 Anemia work up No results for input(s): VITAMINB12, FOLATE, FERRITIN, TIBC, IRON, RETICCTPCT in the last 72 hours. Urinalysis    Component Value Date/Time   COLORURINE TEST TO BE CREDITED, PER DR PATRICK, PATHOLOGY. 03/03/2016 1545   APPEARANCEUR TEST TO BE CREDITED, PER DR PATRICK, PATHOLOGY. 03/03/2016 1545   LABSPEC TEST TO BE CREDITED, PER DR PATRICK, PATHOLOGY. 03/03/2016 1545   LABSPEC 1.025 10/19/2006 1254   PHURINE TEST TO BE CREDITED, PER DR PATRICK, PATHOLOGY. 03/03/2016 1545   GLUCOSEU TEST TO BE CREDITED, PER DR PATRICK, PATHOLOGY. 03/03/2016 1545   HGBUR TEST TO BE CREDITED, PER DR PATRICK, PATHOLOGY. 03/03/2016 1545   BILIRUBINUR neg 05/21/2016 1605   BILIRUBINUR Negative 10/19/2006 1254   KETONESUR TEST TO BE CREDITED, PER DR PATRICK, PATHOLOGY. 03/03/2016 1545   PROTEINUR NEGATIVE 10/10/2018 0850   UROBILINOGEN 0.2 05/21/2016 1605   UROBILINOGEN 0.2 10/02/2014 2307   NITRITE neg 05/21/2016 1605   NITRITE TEST TO BE CREDITED, PER DR PATRICK, PATHOLOGY. 03/03/2016 1545   LEUKOCYTESUR Negative 05/21/2016 1605   LEUKOCYTESUR Negative 10/19/2006 1254   Sepsis Labs Invalid input(s): PROCALCITONIN,  WBC,  LACTICIDVEN Microbiology Recent Results (from the past 240 hour(s))  SARS CORONAVIRUS 2 (TAT 6-24 HRS) Nasopharyngeal Nasopharyngeal Swab     Status: None   Collection Time: 10/18/18  3:36 AM   Specimen: Nasopharyngeal Swab  Result Value Ref Range Status   SARS Coronavirus 2 NEGATIVE NEGATIVE Final    Comment: (NOTE) SARS-CoV-2 target nucleic acids are NOT DETECTED. The SARS-CoV-2 RNA is generally detectable in upper and  lower respiratory specimens during the acute phase of infection. Negative results do not preclude SARS-CoV-2 infection, do not rule out co-infections with other pathogens, and should not be used as the sole basis for treatment or other patient management decisions. Negative results must be combined with clinical observations, patient history, and epidemiological information. The expected result is Negative. Fact Sheet for Patients: SugarRoll.be Fact Sheet for Healthcare Providers: https://www.woods-mathews.com/ This test is not yet approved or cleared by the [place] FDA and  has been authorized for detection and/or diagnosis of SARS-CoV-2 by FDA under an Emergency Use Authorization (EUA). This EUA will remain  in effect (meaning this test can be used) for the duration of the COVID-19 declaration under Section 56 4(b)(1) of the Act, 21 U.S.C. section 360bbb-3(b)(1), unless the authorization is terminated or revoked sooner. Performed at [facility], [place] [address], [place], [place] [address]     SIGNED:   Cordelia Poche, MD [facility] 10/18/2018, 4:29 PM

## 2018-10-18 NOTE — H&P (Signed)
History and Physical    [name] HUD:[id number] DOB: 1940-06-23 DOA: 10/17/2018  PCP: Modena Nunnery D, DO   Patient coming from: Home   Chief Complaint: Leg cramps   HPI: [name] is a 78 y.o. female with medical history significant for dementia, chronic diastolic CHF, insulin-dependent diabetes mellitus, and chronic kidney disease stage III, now presenting to the emergency department for evaluation of bilateral leg cramping.  Family reports that this is been a chronic complaint for the patient, and has apparently been worse than usual over the past month or so.  Patient reports that both legs are involved, both upper and lower legs, denies any preceding fall or inciting event, describes this as cramping in character, intermittent, and severe at times.  She denies any recent fevers, chills, cough, shortness of breath, chest pain, or abdominal pain.  ED Course: Upon arrival to the ED, patient is found to be afebrile, saturating well on room air, bradycardic in the mid 50s, and with stable blood pressure.  EKG features a sinus arrhythmia with rate 47.  Chemistry panel is notable for a potassium of 6.1 and creatinine of 1.29, similar to priors.  CBC features a mild chronic thrombocytopenia.  Patient was treated with calcium, Lokelma, bicarbonate, and IV fluids in the emergency department and hospitalists are consulted for admission.  Review of Systems:  ROS is limited by the patient's clinical condition with dementia.  Past Medical History:  Diagnosis Date  . Chronic anemia   . Chronic diastolic CHF (congestive heart failure) ([place]) 10/03/2013  . Chronic GI bleeding    [name] 11/29/2014  . Family history of anesthesia complication    "niece has a hard time coming out" (09/15/2012)  . Frequent nosebleeds    chronic  . Gastric AV malformation    [name] 11/29/2014  . GERD (gastroesophageal reflux disease)   . 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 History of  blood transfusion "several"  . History of epistaxis   . HTN (hypertension), benign 03/02/2012  . Hyperlipidemia   . Iron deficiency anemia    chronic infusions"  . Lichen planus    Both lower extremities  . 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 Overgrown toenails 03/18/2017  . Pneumonia 1990's X 2  . Pulmonary HTN ([place]) 04/01/2017   PASP 26mHg on echo 09/2016 and 420mg by echo 2019  . Seizures ([place]/2016  . Symptomatic anemia 11/29/2014  . Telangiectasia    Gastric   . 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    Past Surgical History:  Procedure Laterality Date  . CATARACT EXTRACTION     "I think it was just one eye"  . ESOPHAGOGASTRODUODENOSCOPY  02/26/2011   Procedure: ESOPHAGOGASTRODUODENOSCOPY (EGD);  Surgeon: [name];  [facility];  Service: Endoscopy;  Laterality: N/A;  . ESOPHAGOGASTRODUODENOSCOPY N/A 11/08/2012   Procedure: ESOPHAGOGASTRODUODENOSCOPY (EGD);  Surgeon: [name];  [facility];  Service: Endoscopy;  Laterality: N/A;  . ESOPHAGOGASTRODUODENOSCOPY N/A 10/04/2013   Procedure: ESOPHAGOGASTRODUODENOSCOPY (EGD);  Surgeon: [name];  [facility];  Service: Endoscopy;  Laterality: N/A;  with APC on stand-by  . ESOPHAGOGASTRODUODENOSCOPY N/A 07/06/2014   Procedure: ESOPHAGOGASTRODUODENOSCOPY (EGD);  Surgeon: [name];  [facility];  Service: Endoscopy;  Laterality: N/A;  . ESOPHAGOGASTRODUODENOSCOPY N/A 09/05/2014   Procedure: ESOPHAGOGASTRODUODENOSCOPY (EGD);  Surgeon: JaLaurence SpatesMD;  [facility];  Service: Endoscopy;  Laterality: N/A;  APC on standby to control bleeding  . ESOPHAGOGASTRODUODENOSCOPY N/A 11/29/2014   Procedure: ESOPHAGOGASTRODUODENOSCOPY (EGD);  Surgeon: [name], MD;  Location: [facility] ENDOSCOPY;  Service: Endoscopy;  Laterality: N/A;  . ESOPHAGOGASTRODUODENOSCOPY N/A 09/28/2015   Procedure: ESOPHAGOGASTRODUODENOSCOPY  (EGD);  Surgeon: Clarene Essex, MD;  Location: [facility] ENDOSCOPY;  Service: Endoscopy;  Laterality: N/A;  . ESOPHAGOGASTRODUODENOSCOPY (EGD) WITH PROPOFOL N/A 12/04/2017   Procedure: ESOPHAGOGASTRODUODENOSCOPY (EGD) WITH PROPOFOL;  Surgeon: [name], MD;  Location: [place];  Service: Endoscopy;  Laterality: N/A;  . ESOPHAGOGASTRODUODENOSCOPY ENDOSCOPY  08/19/2006   with laser treatment  . HOT HEMOSTASIS N/A 07/06/2014   Procedure: HOT HEMOSTASIS (ARGON PLASMA COAGULATION/BICAP);  Surgeon: Clarene Essex, MD;  [name];  Service: Endoscopy;  Laterality: N/A;  . HOT HEMOSTASIS N/A 09/28/2015   Procedure: HOT HEMOSTASIS (ARGON PLASMA COAGULATION/BICAP);  Surgeon: Clarene Essex, MD;  Location: [facility] ENDOSCOPY;  Service: Endoscopy;  Laterality: N/A;  . HOT HEMOSTASIS N/A 12/04/2017   Procedure: HOT HEMOSTASIS (ARGON PLASMA COAGULATION/BICAP);  Surgeon: [name], MD;  Location: [place];  Service: Endoscopy;  Laterality: N/A;  . NASAL HEMORRHAGE CONTROL     "for bleeding"   . SAVORY DILATION  02/26/2011   Procedure: SAVORY DILATION;  Surgeon: [name], MD;  Location: [name];  Service: Endoscopy;  Laterality: N/A;  c-arm needed  . SUBMUCOSAL INJECTION  12/04/2017   Procedure: SUBMUCOSAL INJECTION;  Surgeon: [name], MD;  Location: [place];  Service: Endoscopy;;     reports that she quit smoking about 47 years ago. Her smoking use included cigarettes. She has a 20.00 pack-year smoking history. She has never used smokeless tobacco. She reports that she does not drink alcohol or use drugs.  Allergies  Allergen Reactions  . Aspirin Nausea And Vomiting    Family History  Problem Relation Age of Onset  . Stroke Father   . Healthy Mother   . Breast cancer Other   . Diabetes Niece   . Malignant hyperthermia Neg Hx   . Seizures Neg Hx      Prior to Admission medications   Medication Sig Start Date End Date Taking? Authorizing Provider  b complex  vitamins capsule Take 1 capsule by mouth daily. 10/26/16  Yes Alphonzo Grieve, MD  Blood Glucose Monitoring Suppl (ONETOUCH VERIO) w/Device KIT Use to check blood sugar up to 4 times a day 10/04/18  Yes Bloomfield, Carley D, DO  calcium-vitamin D (OSCAL WITH D) 500-200 MG-UNIT tablet Take 1 tablet by mouth 2 (two) times daily.   Yes [provider]  cetaphil (CETAPHIL) lotion Apply 1 application topically as needed for dry skin. 02/18/16  Yes Rice, Resa Miner, MD  donepezil (ARICEPT) 10 MG tablet Take 1 tablet (10 mg total) by mouth at bedtime. 11/10/17  Yes Ward Givens, NP  Elastic Bandages & Supports ([place]) [place] Wear as much as possible while awake to reduce swelling 04/27/16  Yes Minus Liberty, MD  FERROCITE 324 MG TABS tablet TAKE 1 TABLET BY MOUTH TWICE DAILY Patient taking differently: Take 1 tablet by mouth 2 (two) times daily.  09/27/17  Yes Bartholomew Crews, MD  furosemide (LASIX) 20 MG tablet Take 1 tablet (20 mg total) by mouth daily. 08/18/18 08/18/19 Yes Turner, Traci R, MD  glucose blood (ONETOUCH VERIO) test strip Use as instructed 4 times daily. E11.29, insulin requiring 10/04/18  Yes Bloomfield, Carley D, DO  insulin degludec (TRESIBA FLEXTOUCH) 100 UNIT/ML SOPN FlexTouch Pen Inject 0.21 mLs (21 Units total)  into the skin every evening. 09/12/18 11/23/18 Yes Bloomfield, Carley D, DO  insulin lispro (HUMALOG KWIKPEN) 100 UNIT/ML KwikPen Inject 3 units under the skin three times daily before each meal. May increase up to 6 units before meals if needed. 08/31/18  Yes Elayne Snare, MD  Insulin Pen Needle (B-D UF III MINI PEN NEEDLES) 31G X 5 MM MISC USE TO INJECT INSULIN  3 TIMES DAILY 08/08/18  Yes Bloomfield, Carley D, DO  levETIRAcetam (KEPPRA) 500 MG tablet Take 1 tablet (500 mg total) by mouth 2 (two) times daily. 11/10/17  Yes Ward Givens, NP  memantine (NAMENDA) 10 MG tablet Take 1 tablet (10 mg total) by mouth 2 (two) times daily. 11/10/17  Yes  Ward Givens, NP  ONETOUCH DELICA LANCETS FINE MISC Check blood sugar up to 4 times a day 09/20/17  Yes Bartholomew Crews, MD  oxycodone-acetaminophen (PERCOCET) 2.5-325 MG tablet Take 1 tablet by mouth at bedtime as needed for pain. 10/12/18  Yes Bloomfield, Carley D, DO  oxymetazoline (AFRIN) 0.05 % nasal spray Place 1 spray into both nostrils 2 (two) times daily as needed (Epistaxis). 10/05/13  Yes Robbie Lis, MD  pantoprazole (PROTONIX) 40 MG tablet Take 1 tablet by mouth once daily 08/01/18  Yes Bloomfield, Carley D, DO  potassium chloride (K-DUR) 10 MEQ tablet Take 10 mEq by mouth daily.   Yes [provider]  sodium chloride (OCEAN) 0.65 % SOLN nasal spray Place 1 spray into both nostrils as needed for congestion. 08/22/18  Yes Lamptey, Myrene Galas, MD  triamcinolone ointment (KENALOG) 0.5 % APPLY 1 APPLICATION TOPICALLY TO RASH TWICE DAILY FOR ITCHING Patient taking differently: Apply 1 application topically 2 (two) times daily as needed (itching).  02/07/18  Yes Alphonzo Grieve, MD  TRULICITY 1.5 OM/3.5DH SOPN INJECT 1.5 MG INTO THE SKIN ONCE A WEEK. EVERY TUESDAY Patient taking differently: Inject 1.5 mg as directed every Tuesday.  09/13/18  Yes Bloomfield, Carley D, DO  lactulose (CHRONULAC) 10 GM/15ML solution TAKE 15 ML BY MOUTH TWO TIMES DAILY Patient not taking: Reported on 10/18/2018 11/18/17   Modena Nunnery D, DO  potassium chloride (K-DUR) 10 MEQ tablet Take 1 tablet (10 mEq total) by mouth daily. 08/18/18 10/18/18  [name], MD    Physical Exam: Vitals:   10/17/18 2137 10/18/18 0047 10/18/18 0230  BP: (!) 158/53 (!) 148/55 (!) 159/62  Pulse: (!) 55 (!) 46 (!) 49  Resp: _0 Temp: 98.7 F (37.1 C)    SpO2: 97% 98% 98%    Constitutional: NAD, calm  Eyes: PERTLA, lids and conjunctivae normal ENMT: Mucous membranes are moist. Posterior pharynx clear of any exudate or lesions.   Neck: normal, supple, no masses, no thyromegaly Respiratory: clear to  auscultation bilaterally, no wheezing, no crackles. No accessory muscle use.  Cardiovascular: S1 & S2 heard, regular rate and rhythm. No extremity edema.   Abdomen: No distension, no tenderness, soft. Bowel sounds active.  Musculoskeletal: no clubbing / cyanosis. No joint deformity upper and lower extremities.    Skin: no significant rashes, lesions, ulcers. Warm, dry, well-perfused. Neurologic: No gross facial asymmetry. Sensation intact. Moving all extremities.  Psychiatric:  Alert and oriented to person and place, but not oriented to month or year. Very pleasant, cooperative.    Labs on Admission: I have personally reviewed following labs and imaging studies  CBC: Recent Labs  Lab 10/17/18 2232  WBC 5.4  HGB 12.9  HCT 38.3  MCV 95.0  PLT 135*  Basic Metabolic Panel: Recent Labs  Lab 10/17/18 2210 10/18/18 0042  NA 140 139  K 5.9* 6.1*  CL 107 108  CO2 24 25  GLUCOSE 125* 90  BUN 30* 30*  CREATININE 1.34* 1.29*  CALCIUM 8.9 9.4  MG 1.8  --    GFR: Estimated Creatinine Clearance: 33.1 mL/min (A) (by C-G formula based on SCr of 1.29 mg/dL (H)). Liver Function Tests: Recent Labs  Lab 10/17/18 2210 10/18/18 0042  AST 48* 48*  ALT 37 36  ALKPHOS 114 115  BILITOT 0.4 0.6  PROT 7.0 7.1  ALBUMIN 3.7 3.9   No results for input(s): LIPASE, AMYLASE in the last 168 hours. No results for input(s): AMMONIA in the last 168 hours. Coagulation Profile: No results for input(s): INR, PROTIME in the last 168 hours. Cardiac Enzymes: No results for input(s): CKTOTAL, CKMB, CKMBINDEX, TROPONINI in the last 168 hours. BNP (last 3 results) No results for input(s): PROBNP in the last 8760 hours. HbA1C: No results for input(s): HGBA1C in the last 72 hours. CBG: Recent Labs  Lab 10/18/18 0104 10/18/18 0234  GLUCAP 80 66*   Lipid Profile: No results for input(s): CHOL, HDL, LDLCALC, TRIG, CHOLHDL, LDLDIRECT in the last 72 hours. Thyroid Function Tests: No results for  input(s): TSH, T4TOTAL, FREET4, T3FREE, THYROIDAB in the last 72 hours. Anemia Panel: No results for input(s): VITAMINB12, FOLATE, FERRITIN, TIBC, IRON, RETICCTPCT in the last 72 hours. Urine analysis:    Component Value Date/Time   COLORURINE TEST TO BE CREDITED, PER DR PATRICK, PATHOLOGY. 03/03/2016 1545   APPEARANCEUR TEST TO BE CREDITED, PER DR PATRICK, PATHOLOGY. 03/03/2016 1545   LABSPEC TEST TO BE CREDITED, PER DR PATRICK, PATHOLOGY. 03/03/2016 1545   LABSPEC 1.025 10/19/2006 1254   PHURINE TEST TO BE CREDITED, PER DR PATRICK, PATHOLOGY. 03/03/2016 1545   GLUCOSEU TEST TO BE CREDITED, PER DR PATRICK, PATHOLOGY. 03/03/2016 1545   HGBUR TEST TO BE CREDITED, PER DR PATRICK, PATHOLOGY. 03/03/2016 1545   BILIRUBINUR neg 05/21/2016 1605   BILIRUBINUR Negative 10/19/2006 1254   KETONESUR TEST TO BE CREDITED, PER DR PATRICK, PATHOLOGY. 03/03/2016 1545   PROTEINUR NEGATIVE 10/10/2018 0850   UROBILINOGEN 0.2 05/21/2016 1605   UROBILINOGEN 0.2 10/02/2014 2307   NITRITE neg 05/21/2016 1605   NITRITE TEST TO BE CREDITED, PER DR PATRICK, PATHOLOGY. 03/03/2016 1545   LEUKOCYTESUR Negative 05/21/2016 1605   LEUKOCYTESUR Negative 10/19/2006 1254   Sepsis Labs: _0 (procalcitonin:4,lacticidven:4) )No results found for this or any previous visit (from the past 240 hour(s)).   Radiological Exams on Admission: No results found.  EKG: Independently reviewed. Sinus arrhythmia, rate 47.   Assessment/Plan   1. Hyperkalemia  - Presents with increase in chronic bilateral leg cramps, found to have serum potassium of 6.1 with bradyarrhythmia on EKG with normal QRS and QT intervals  - She was treated with calcium, Lokelma, and IVF in ED  - Continue cardiac monitoring, stop potassium supplements, follow serial potassium levels until normalizes    2. Chronic diastolic CHF  - Appears compensated   - Continue Lasix and daily weights   3. CKD stage III  - SCr is 1.29 on admission, consistent  with her apparent baseline  - Renally-dose medications, monitor    4. Insulin-dependent DM  - A1c was 8.7% in June 2020  - Check CBG's, continue insulin    5. Dementia  - Continue Aricept and Namenda     PPE: Mask, face shield  DVT prophylaxis: sq heparin  Code Status: Partial, no  intubation  Family Communication: Discussed with patient  Consults called: none  Admission status: observation     Vianne Bulls, MD [facility] Pager [phone]  If 7PM-7AM, please contact night-coverage [email or web address] Password [email or web address]  10/18/2018, 3:10 AM

## 2018-10-18 NOTE — Progress Notes (Signed)
Patient discharged home.  IV removed and WNL, port de-accessed by IV team and appears WNL.  Reviewed AVS and medications with patient and husband- instructed to stop taking K+ and to follow up with PCP in 1 week.  Patient verbalizes understanding.  No questions at this time.  Assisted off unit via WC in NAD by NT

## 2018-10-18 NOTE — Progress Notes (Signed)
Internal Medicine Clinic Attending  Case discussed with Dr. Winfrey at the time of the visit.  We reviewed the resident's history and exam and pertinent patient test results.  I agree with the assessment, diagnosis, and plan of care documented in the resident's note.  Alexander Raines, M.D., Ph.D.  

## 2018-10-18 NOTE — ED Notes (Signed)
ED TO INPATIENT HANDOFF REPORT  ED Nurse Name and Phone #: jon wled   S Name/Age/Gender [name] 78 y.o. female Room/Bed: WA23/WA23  Code Status   Code Status: Prior  Home/SNF/Other Home Patient oriented to: self, place, time and situation Is this baseline? Yes   Triage Complete: Triage complete  Chief Complaint leg cramping  Triage Note Patient c/o cramping to bilateral legs x1 month. Reports taking "cramping pills" with no relief.   Allergies Allergies  Allergen Reactions  . Aspirin Nausea And Vomiting    Level of Care/Admitting Diagnosis ED Disposition    ED Disposition Condition Comment   Admit  Hospital Area: [facility] [[id number]]  Level of Care: Telemetry [5]  Admit to tele based on following criteria: Complex arrhythmia (Bradycardia/Tachycardia)  Covid Evaluation: Asymptomatic Screening Protocol (No Symptoms)  Diagnosis: Hyperkalemia [800349]  Admitting Physician: Vianne Bulls [[id number]]  Attending Physician: Vianne Bulls [[id number]]  PT Class (Do Not Modify): Observation [104]  PT Acc Code (Do Not Modify): Observation [10022]       B Medical/Surgery History Past Medical History:  Diagnosis Date  . Chronic anemia   . Chronic diastolic CHF (congestive heart failure) ([place]) 10/03/2013  . Chronic GI bleeding    [name] 11/29/2014  . Family history of anesthesia complication    "niece has a hard time coming out" (09/15/2012)  . Frequent nosebleeds    chronic  . Gastric AV malformation    [name] 11/29/2014  . GERD (gastroesophageal reflux disease)   . 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 History of blood transfusion "several"  . History of epistaxis   . HTN (hypertension), benign 03/02/2012  . Hyperlipidemia   . Iron deficiency anemia    chronic infusions"  . Lichen planus    Both lower extremities  . 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 Overgrown toenails 03/18/2017  . Pneumonia  1990's X 2  . Pulmonary HTN ([place]) 04/01/2017   PASP 69mmHg on echo 09/2016 and 7mmHg by echo 2019  . Seizures ([place]) 09/2014  . Symptomatic anemia 11/29/2014  . Telangiectasia    Gastric   . 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   Past Surgical History:  Procedure Laterality Date  . CATARACT EXTRACTION     "I think it was just one eye"  . ESOPHAGOGASTRODUODENOSCOPY  02/26/2011   Procedure: ESOPHAGOGASTRODUODENOSCOPY (EGD);  Surgeon: [name], MD;  [name];  Service: Endoscopy;  Laterality: N/A;  . ESOPHAGOGASTRODUODENOSCOPY N/A 11/08/2012   Procedure: ESOPHAGOGASTRODUODENOSCOPY (EGD);  Surgeon: [name], MD;  Location: [name];  Service: Endoscopy;  Laterality: N/A;  . ESOPHAGOGASTRODUODENOSCOPY N/A 10/04/2013   Procedure: ESOPHAGOGASTRODUODENOSCOPY (EGD);  Surgeon: Winfield Cunas., MD;  Location: [name];  Service: Endoscopy;  Laterality: N/A;  with APC on stand-by  . ESOPHAGOGASTRODUODENOSCOPY N/A 07/06/2014   Procedure: ESOPHAGOGASTRODUODENOSCOPY (EGD);  Surgeon: Clarene Essex, MD;  Location: [name];  Service: Endoscopy;  Laterality: N/A;  . ESOPHAGOGASTRODUODENOSCOPY N/A 09/05/2014   Procedure: ESOPHAGOGASTRODUODENOSCOPY (EGD);  Surgeon: Laurence Spates, MD;  Location: [name];  Service: Endoscopy;  Laterality: N/A;  APC on standby to control bleeding  . ESOPHAGOGASTRODUODENOSCOPY N/A 11/29/2014   Procedure: ESOPHAGOGASTRODUODENOSCOPY (EGD);  Surgeon: [name], MD;  Location: [facility]COPY;  Service: Endoscopy;  Laterality: N/A;  . ESOPHAGOGASTRODUODENOSCOPY N/A 09/28/2015   Procedure: ESOPHAGOGASTRODUODENOSCOPY (EGD);  Surgeon: Clarene Essex, MD;  Location: [facility] ENDOSCOPY;  Service: Endoscopy;  Laterality:  N/A;  . ESOPHAGOGASTRODUODENOSCOPY (EGD) WITH PROPOFOL N/A 12/04/2017   Procedure: ESOPHAGOGASTRODUODENOSCOPY (EGD) WITH PROPOFOL;  Surgeon: [name], MD;  Location: [facility];  Service: Endoscopy;  Laterality:  N/A;  . ESOPHAGOGASTRODUODENOSCOPY ENDOSCOPY  08/19/2006   with laser treatment  . HOT HEMOSTASIS N/A 07/06/2014   Procedure: HOT HEMOSTASIS (ARGON PLASMA COAGULATION/BICAP);  Surgeon: Clarene Essex, MD;  Location: [name];  Service: Endoscopy;  Laterality: N/A;  . HOT HEMOSTASIS N/A 09/28/2015   Procedure: HOT HEMOSTASIS (ARGON PLASMA COAGULATION/BICAP);  Surgeon: Clarene Essex, MD;  Location: [facility] ENDOSCOPY;  Service: Endoscopy;  Laterality: N/A;  . HOT HEMOSTASIS N/A 12/04/2017   Procedure: HOT HEMOSTASIS (ARGON PLASMA COAGULATION/BICAP);  Surgeon: [name], MD;  Location: [facility];  Service: Endoscopy;  Laterality: N/A;  . NASAL HEMORRHAGE CONTROL     "for bleeding"   . SAVORY DILATION  02/26/2011   Procedure: SAVORY DILATION;  Surgeon: [name], MD;  Location: [name];  Service: Endoscopy;  Laterality: N/A;  c-arm needed  . SUBMUCOSAL INJECTION  12/04/2017   Procedure: SUBMUCOSAL INJECTION;  Surgeon: [name], MD;  Location: [facility];  Service: Endoscopy;;     A IV Location/Drains/Wounds Patient Lines/Drains/Airways Status   Active Line/Drains/Airways    Name:   Placement date:   Placement time:   Site:   Days:   Implanted Port 07/02/15   07/02/15    0850    -   8527   Implanted Port 10/24/15   10/24/15    0826    -   1090   Implanted Port 04/13/18 Right Chest   04/13/18    0855    Chest   188   Implanted Port Right Chest   -    -    Chest      Peripheral IV 10/17/18 Left Hand   10/17/18    2243    Hand   1          Intake/Output Last 24 hours No intake or output data in the 24 hours ending 10/18/18 0451  Labs/Imaging Results for orders placed or performed during the hospital encounter of 10/17/18 (from the past 48 hour(s))  Comprehensive metabolic panel     Status: Abnormal   Collection Time: 10/17/18 10:10 PM  Result Value Ref Range   Sodium 140 135 - 145 mmol/L   Potassium 5.9 (H) 3.5 - 5.1 mmol/L   Chloride 107 98 - 111 mmol/L   CO2 24  22 - 32 mmol/L   Glucose, Bld 125 (H) 70 - 99 mg/dL   BUN 30 (H) 8 - 23 mg/dL   Creatinine, Ser 1.34 (H) 0.44 - 1.00 mg/dL   Calcium 8.9 8.9 - 10.3 mg/dL   Total Protein 7.0 6.5 - 8.1 g/dL   Albumin 3.7 3.5 - 5.0 g/dL   AST 48 (H) 15 - 41 U/L   ALT 37 0 - 44 U/L   Alkaline Phosphatase 114 38 - 126 U/L   Total Bilirubin 0.4 0.3 - 1.2 mg/dL   GFR calc non Af Amer 38 (L) >60 mL/min   GFR calc Af Amer 44 (L) >60 mL/min   Anion gap 9 5 - 15    Comment: Performed at [facility], [place] [address] [place], [place] [address]  Magnesium     Status: None   Collection Time: 10/17/18 10:10 PM  Result Value Ref Range   Magnesium 1.8 1.7 - 2.4 mg/dL    Comment: Performed at [facility],  [place] [address], [place], [place] [address]  CBC     Status: Abnormal   Collection Time: 10/17/18 10:32 PM  Result Value Ref Range   WBC 5.4 4.0 - 10.5 K/uL   RBC 4.03 3.87 - 5.11 MIL/uL   Hemoglobin 12.9 12.0 - 15.0 g/dL   HCT 38.3 36.0 - 46.0 %   MCV 95.0 80.0 - 100.0 fL   MCH 32.0 26.0 - 34.0 pg   MCHC 33.7 30.0 - 36.0 g/dL   RDW 13.2 11.5 - 15.5 %   Platelets 135 (L) 150 - 400 K/uL   nRBC 0.0 0.0 - 0.2 %    Comment: Performed at [facility], [place] [address], [place], [place] [address]  Comprehensive metabolic panel     Status: Abnormal   Collection Time: 10/18/18 12:42 AM  Result Value Ref Range   Sodium 139 135 - 145 mmol/L   Potassium 6.1 (H) 3.5 - 5.1 mmol/L   Chloride 108 98 - 111 mmol/L   CO2 25 22 - 32 mmol/L   Glucose, Bld 90 70 - 99 mg/dL   BUN 30 (H) 8 - 23 mg/dL   Creatinine, Ser 1.29 (H) 0.44 - 1.00 mg/dL   Calcium 9.4 8.9 - 10.3 mg/dL   Total Protein 7.1 6.5 - 8.1 g/dL   Albumin 3.9 3.5 - 5.0 g/dL   AST 48 (H) 15 - 41 U/L   ALT 36 0 - 44 U/L   Alkaline Phosphatase 115 38 - 126 U/L   Total Bilirubin 0.6 0.3 - 1.2 mg/dL   GFR calc non Af Amer 40 (L) >60 mL/min   GFR calc Af Amer 46 (L) >60 mL/min   Anion gap 6 5 - 15     Comment: Performed at [facility], [place] [address], [place], [place] [address]  POC CBG, ED     Status: None   Collection Time: 10/18/18  1:04 AM  Result Value Ref Range   Glucose-Capillary 80 70 - 99 mg/dL  CBG monitoring, ED     Status: Abnormal   Collection Time: 10/18/18  2:34 AM  Result Value Ref Range   Glucose-Capillary 66 (L) 70 - 99 mg/dL  Basic metabolic panel     Status: Abnormal   Collection Time: 10/18/18  3:00 AM  Result Value Ref Range   Sodium 144 135 - 145 mmol/L   Potassium 5.4 (H) 3.5 - 5.1 mmol/L   Chloride 107 98 - 111 mmol/L   CO2 29 22 - 32 mmol/L   Glucose, Bld 64 (L) 70 - 99 mg/dL   BUN 31 (H) 8 - 23 mg/dL   Creatinine, Ser 1.44 (H) 0.44 - 1.00 mg/dL   Calcium 9.5 8.9 - 10.3 mg/dL   GFR calc non Af Amer 35 (L) >60 mL/min   GFR calc Af Amer 40 (L) >60 mL/min   Anion gap 8 5 - 15    Comment: Performed at [facility], [place] [address] [place], [place] [address]  CBC WITH DIFFERENTIAL     Status: Abnormal   Collection Time: 10/18/18  3:00 AM  Result Value Ref Range   WBC 5.9 4.0 - 10.5 K/uL   RBC 4.09 3.87 - 5.11 MIL/uL   Hemoglobin 13.0 12.0 - 15.0 g/dL   HCT 39.0 36.0 - 46.0 %   MCV 95.4 80.0 - 100.0 fL   MCH 31.8 26.0 - 34.0 pg   MCHC 33.3 30.0 - 36.0 g/dL   RDW 13.4  11.5 - 15.5 %   Platelets 161 150 - 400 K/uL   nRBC 0.0 0.0 - 0.2 %   Neutrophils Relative % 61 %   Neutro Abs 3.6 1.7 - 7.7 K/uL   Lymphocytes Relative 18 %   Lymphs Abs 1.1 0.7 - 4.0 K/uL   Monocytes Relative 11 %   Monocytes Absolute 0.6 0.1 - 1.0 K/uL   Eosinophils Relative 9 %   Eosinophils Absolute 0.6 (H) 0.0 - 0.5 K/uL   Basophils Relative 1 %   Basophils Absolute 0.0 0.0 - 0.1 K/uL   Immature Granulocytes 0 %   Abs Immature Granulocytes 0.02 0.00 - 0.07 K/uL    Comment: Performed at [facility], [place] [address] [place], [place] [address]   *Note: Due to a large number of results and/or encounters for the requested  time period, some results have not been displayed. A complete set of results can be found in Results Review.   No results found.  Pending Labs [employer] (From admission, onward)    Start     Ordered   10/18/18 0222  SARS CORONAVIRUS 2 (TAT 6-24 HRS) Nasopharyngeal Nasopharyngeal Swab  (Asymptomatic/Tier 2 Patients Labs)  Once,   STAT    Question Answer Comment  Is this test for diagnosis or screening Screening   Symptomatic for COVID-19 as defined by CDC No   Hospitalized for COVID-19 No   Admitted to ICU for COVID-19 No   Previously tested for COVID-19 Yes   Resident in a congregate (group) care setting No   Employed in healthcare setting No   Pregnant No      10/18/18 0222   Signed and Held  Potassium  Now then every 4 hours,   STAT    Comments: Until normal twice.    Signed and Held   Signed and Held  Creatinine, serum  (heparin)  Once,   R    Comments: Baseline for heparin therapy IF NOT ALREADY DRAWN.    Signed and Held          Vitals/Pain Today's Vitals   10/18/18 0300 10/18/18 0330 10/18/18 0400 10/18/18 0430  BP: (!) 175/61 (!) 142/61 (!) 170/75 (!) 157/65  Pulse: (!) 52 (!) 58 (!) 58 (!) 54  Resp: 17 13 12 10   Temp:      SpO2: 97% 96% 96% 97%  PainSc:        Isolation Precautions No active isolations  Medications Medications  insulin aspart (novoLOG) injection 0-9 Units (has no administration in time range)  insulin aspart (novoLOG) injection 0-5 Units (has no administration in time range)  insulin glargine (LANTUS) injection 10 Units (has no administration in time range)  acetaminophen (TYLENOL) tablet 650 mg (650 mg Oral Given 10/17/18 2248)  HYDROmorphone (DILAUDID) injection 0.5 mg (0.5 mg Intravenous Given 10/17/18 2248)  sodium bicarbonate injection 50 mEq (50 mEq Intravenous Given 10/18/18 0240)  sodium zirconium cyclosilicate (LOKELMA) packet 10 g (10 g Oral Given 10/18/18 0246)  calcium gluconate 1 g/ 50 mL sodium chloride IVPB (0 g Intravenous  Stopped 10/18/18 0442)  dextrose 50 % solution 50 mL (50 mLs Intravenous Given 10/18/18 0316)    Mobility walks with device Moderate fall risk   Focused Assessment   R Recommendations: See Admitting Provider Note  Report given to:   Additional Notes:

## 2018-10-18 NOTE — ED Notes (Signed)
Pt's husband at bedside and is going to leave. He would like to be contacted with any updates. His name is [name] and he can be reached at [phone].

## 2018-10-19 ENCOUNTER — Telehealth (HOSPITAL_COMMUNITY): Payer: Self-pay

## 2018-10-20 ENCOUNTER — Telehealth: Payer: Self-pay | Admitting: Internal Medicine

## 2018-10-20 NOTE — Telephone Encounter (Signed)
Rec'd call from [facility] Darlina Guys G LPN)  To report [facility] Date for [facility] on 10/25/2018.

## 2018-10-24 ENCOUNTER — Telehealth: Payer: Self-pay | Admitting: Internal Medicine

## 2018-10-24 ENCOUNTER — Inpatient Hospital Stay: Payer: Medicare Other

## 2018-10-24 ENCOUNTER — Other Ambulatory Visit: Payer: Self-pay

## 2018-10-24 ENCOUNTER — Ambulatory Visit (INDEPENDENT_AMBULATORY_CARE_PROVIDER_SITE_OTHER): Payer: Medicare Other | Admitting: Internal Medicine

## 2018-10-24 ENCOUNTER — Inpatient Hospital Stay: Payer: Medicare Other | Attending: Hematology

## 2018-10-24 VITALS — BP 154/55 | HR 61 | Temp 98.5°F | Resp 18

## 2018-10-24 VITALS — BP 165/54 | HR 55 | Temp 98.5°F | Ht 63.0 in | Wt 144.3 lb

## 2018-10-24 DIAGNOSIS — Z79899 Other long term (current) drug therapy: Secondary | ICD-10-CM | POA: Insufficient documentation

## 2018-10-24 DIAGNOSIS — E1122 Type 2 diabetes mellitus with diabetic chronic kidney disease: Secondary | ICD-10-CM

## 2018-10-24 DIAGNOSIS — Q2733 Arteriovenous malformation of digestive system vessel: Secondary | ICD-10-CM | POA: Diagnosis not present

## 2018-10-24 DIAGNOSIS — M79604 Pain in right leg: Secondary | ICD-10-CM | POA: Insufficient documentation

## 2018-10-24 DIAGNOSIS — M79605 Pain in left leg: Secondary | ICD-10-CM | POA: Insufficient documentation

## 2018-10-24 DIAGNOSIS — I780 Hereditary hemorrhagic telangiectasia: Secondary | ICD-10-CM

## 2018-10-24 DIAGNOSIS — Z5112 Encounter for antineoplastic immunotherapy: Secondary | ICD-10-CM | POA: Insufficient documentation

## 2018-10-24 DIAGNOSIS — D500 Iron deficiency anemia secondary to blood loss (chronic): Secondary | ICD-10-CM

## 2018-10-24 DIAGNOSIS — I480 Paroxysmal atrial fibrillation: Secondary | ICD-10-CM

## 2018-10-24 DIAGNOSIS — R040 Epistaxis: Secondary | ICD-10-CM | POA: Diagnosis not present

## 2018-10-24 DIAGNOSIS — I130 Hypertensive heart and chronic kidney disease with heart failure and stage 1 through stage 4 chronic kidney disease, or unspecified chronic kidney disease: Secondary | ICD-10-CM | POA: Insufficient documentation

## 2018-10-24 DIAGNOSIS — I5032 Chronic diastolic (congestive) heart failure: Secondary | ICD-10-CM | POA: Insufficient documentation

## 2018-10-24 DIAGNOSIS — K219 Gastro-esophageal reflux disease without esophagitis: Secondary | ICD-10-CM | POA: Insufficient documentation

## 2018-10-24 DIAGNOSIS — E875 Hyperkalemia: Secondary | ICD-10-CM | POA: Diagnosis not present

## 2018-10-24 DIAGNOSIS — E785 Hyperlipidemia, unspecified: Secondary | ICD-10-CM | POA: Insufficient documentation

## 2018-10-24 DIAGNOSIS — Z95828 Presence of other vascular implants and grafts: Secondary | ICD-10-CM

## 2018-10-24 DIAGNOSIS — Z794 Long term (current) use of insulin: Secondary | ICD-10-CM | POA: Diagnosis not present

## 2018-10-24 DIAGNOSIS — I129 Hypertensive chronic kidney disease with stage 1 through stage 4 chronic kidney disease, or unspecified chronic kidney disease: Secondary | ICD-10-CM

## 2018-10-24 DIAGNOSIS — E1165 Type 2 diabetes mellitus with hyperglycemia: Secondary | ICD-10-CM | POA: Insufficient documentation

## 2018-10-24 DIAGNOSIS — I272 Pulmonary hypertension, unspecified: Secondary | ICD-10-CM | POA: Insufficient documentation

## 2018-10-24 DIAGNOSIS — M542 Cervicalgia: Secondary | ICD-10-CM | POA: Diagnosis not present

## 2018-10-24 DIAGNOSIS — E1151 Type 2 diabetes mellitus with diabetic peripheral angiopathy without gangrene: Secondary | ICD-10-CM

## 2018-10-24 DIAGNOSIS — R252 Cramp and spasm: Secondary | ICD-10-CM

## 2018-10-24 DIAGNOSIS — N183 Chronic kidney disease, stage 3 (moderate): Secondary | ICD-10-CM

## 2018-10-24 DIAGNOSIS — K31819 Angiodysplasia of stomach and duodenum without bleeding: Secondary | ICD-10-CM

## 2018-10-24 DIAGNOSIS — F0390 Unspecified dementia without behavioral disturbance: Secondary | ICD-10-CM | POA: Diagnosis not present

## 2018-10-24 LAB — CBC WITH DIFFERENTIAL (CANCER CENTER ONLY)
Abs Immature Granulocytes: 0.02 10*3/uL (ref 0.00–0.07)
Basophils Absolute: 0 10*3/uL (ref 0.0–0.1)
Basophils Relative: 1 %
Eosinophils Absolute: 0.5 10*3/uL (ref 0.0–0.5)
Eosinophils Relative: 9 %
HCT: 36.6 % (ref 36.0–46.0)
Hemoglobin: 12.3 g/dL (ref 12.0–15.0)
Immature Granulocytes: 0 %
Lymphocytes Relative: 10 %
Lymphs Abs: 0.6 10*3/uL — ABNORMAL LOW (ref 0.7–4.0)
MCH: 31.1 pg (ref 26.0–34.0)
MCHC: 33.6 g/dL (ref 30.0–36.0)
MCV: 92.7 fL (ref 80.0–100.0)
Monocytes Absolute: 0.6 10*3/uL (ref 0.1–1.0)
Monocytes Relative: 11 %
Neutro Abs: 3.9 10*3/uL (ref 1.7–7.7)
Neutrophils Relative %: 69 %
Platelet Count: 135 10*3/uL — ABNORMAL LOW (ref 150–400)
RBC: 3.95 MIL/uL (ref 3.87–5.11)
RDW: 13.2 % (ref 11.5–15.5)
WBC Count: 5.7 10*3/uL (ref 4.0–10.5)
nRBC: 0 % (ref 0.0–0.2)

## 2018-10-24 LAB — GLUCOSE, CAPILLARY: Glucose-Capillary: 157 mg/dL — ABNORMAL HIGH (ref 70–99)

## 2018-10-24 MED ORDER — SODIUM CHLORIDE 0.9% FLUSH
10.0000 mL | INTRAVENOUS | Status: DC | PRN
  Administered 2018-10-24: 10 mL
  Filled 2018-10-24: qty 10

## 2018-10-24 MED ORDER — SODIUM CHLORIDE 0.9 % IV SOLN
4.6000 mg/kg | Freq: Once | INTRAVENOUS | Status: AC
  Administered 2018-10-24: 300 mg via INTRAVENOUS
  Filled 2018-10-24: qty 12

## 2018-10-24 MED ORDER — HEPARIN SOD (PORK) LOCK FLUSH 100 UNIT/ML IV SOLN
500.0000 [IU] | Freq: Once | INTRAVENOUS | Status: AC | PRN
  Administered 2018-10-24: 500 [IU]
  Filled 2018-10-24: qty 5

## 2018-10-24 MED ORDER — SODIUM CHLORIDE 0.9 % IV SOLN
Freq: Once | INTRAVENOUS | Status: AC
  Administered 2018-10-24: 10:00:00 via INTRAVENOUS
  Filled 2018-10-24: qty 250

## 2018-10-24 NOTE — Telephone Encounter (Signed)
Pt's spouse states pt is having "a awful time with leg cramps, she needs her K+ pills back" after reviewing labs and last ED visit, appt [facility] today at 1545

## 2018-10-24 NOTE — Patient Instructions (Signed)
It was a pleasure to see you today [name]. I am sorry that your legs are cramping and are sore. I have checked some bloodwork today for which I will call you about the results.   If you have any questions or concerns, please call our clinic at [phone] between 9am-5pm and after hours call [phone] and ask for the internal medicine resident on call. If you feel you are having a medical emergency please call 911.   Thank you, we look forward to help you remain healthy!  Lars Mage, MD Internal Medicine PGY3

## 2018-10-24 NOTE — Telephone Encounter (Signed)
Refill  Request  oxycodone-acetaminophen (PERCOCET) 2.5-325 MG tablet  [id number] - [place], [place] - [address]

## 2018-10-24 NOTE — Telephone Encounter (Signed)
I agree. Thank you.

## 2018-10-24 NOTE — Patient Instructions (Signed)
Bevacizumab injection What is this medicine? BEVACIZUMAB (be va SIZ yoo mab) is a monoclonal antibody. It is used to treat many types of cancer. This medicine may be used for other purposes; ask your health care provider or pharmacist if you have questions. COMMON BRAND NAME(S): Avastin, MVASI, Zirabev What should I tell my health care provider before I take this medicine? They need to know if you have any of these conditions:  diabetes  heart disease  high blood pressure  history of coughing up blood  prior anthracycline chemotherapy (e.g., doxorubicin, daunorubicin, epirubicin)  recent or ongoing radiation therapy  recent or planning to have surgery  stroke  an unusual or allergic reaction to bevacizumab, hamster proteins, mouse proteins, other medicines, foods, dyes, or preservatives  pregnant or trying to get pregnant  breast-feeding How should I use this medicine? This medicine is for infusion into a vein. It is given by a health care professional in a hospital or clinic setting. Talk to your pediatrician regarding the use of this medicine in children. Special care may be needed. Overdosage: If you think you have taken too much of this medicine contact a poison control center or emergency room at once. NOTE: This medicine is only for you. Do not share this medicine with others. What if I miss a dose? It is important not to miss your dose. Call your doctor or health care professional if you are unable to keep an appointment. What may interact with this medicine? Interactions are not expected. This list may not describe all possible interactions. Give your health care provider a list of all the medicines, herbs, non-prescription drugs, or dietary supplements you use. Also tell them if you smoke, drink alcohol, or use illegal drugs. Some items may interact with your medicine. What should I watch for while using this medicine? Your condition will be monitored carefully while  you are receiving this medicine. You will need important blood work and urine testing done while you are taking this medicine. This medicine may increase your risk to bruise or bleed. Call your doctor or health care professional if you notice any unusual bleeding. This medicine should be started at least 28 days following major surgery and the site of the surgery should be totally healed. Check with your doctor before scheduling dental work or surgery while you are receiving this treatment. Talk to your doctor if you have recently had surgery or if you have a wound that has not healed. Do not become pregnant while taking this medicine or for 6 months after stopping it. Women should inform their doctor if they wish to become pregnant or think they might be pregnant. There is a potential for serious side effects to an unborn child. Talk to your health care professional or pharmacist for more information. Do not breast-feed an infant while taking this medicine and for 6 months after the last dose. This medicine has caused ovarian failure in some women. This medicine may interfere with the ability to have a child. You should talk to your doctor or health care professional if you are concerned about your fertility. What side effects may I notice from receiving this medicine? Side effects that you should report to your doctor or health care professional as soon as possible:  allergic reactions like skin rash, itching or hives, swelling of the face, lips, or tongue  chest pain or chest tightness  chills  coughing up blood  high fever  seizures  severe constipation  signs and symptoms   of bleeding such as bloody or black, tarry stools; red or dark-brown urine; spitting up blood or brown material that looks like coffee grounds; red spots on the skin; unusual bruising or bleeding from the eye, gums, or nose  signs and symptoms of a blood clot such as breathing problems; chest pain; severe, sudden  headache; pain, swelling, warmth in the leg  signs and symptoms of a stroke like changes in vision; confusion; trouble speaking or understanding; severe headaches; sudden numbness or weakness of the face, arm or leg; trouble walking; dizziness; loss of balance or coordination  stomach pain  sweating  swelling of legs or ankles  vomiting  weight gain Side effects that usually do not require medical attention (report to your doctor or health care professional if they continue or are bothersome):  back pain  changes in taste  decreased appetite  dry skin  nausea  tiredness This list may not describe all possible side effects. Call your doctor for medical advice about side effects. You may report side effects to FDA at 1-800-FDA-1088. Where should I keep my medicine? This drug is given in a hospital or clinic and will not be stored at home. NOTE: This sheet is a summary. It may not cover all possible information. If you have questions about this medicine, talk to your doctor, pharmacist, or health care provider.  2020 Elsevier/Gold Standard (2016-01-24 14:33:29)  Coronavirus (COVID-19) Are you at risk?  Are you at risk for the Coronavirus (COVID-19)?  To be considered HIGH RISK for Coronavirus (COVID-19), you have to meet the following criteria:  . Traveled to [place], [place], [place], [place] or [place]; or in the [place] to [place], [place], [place], or [place]; and have fever, cough, and shortness of breath within the last 2 weeks of travel OR . Been in close contact with a person diagnosed with COVID-19 within the last 2 weeks and have fever, cough, and shortness of breath . IF YOU DO NOT MEET THESE CRITERIA, YOU ARE CONSIDERED LOW RISK FOR COVID-19.  What to do if you are HIGH RISK for COVID-19?  . If you are having a medical emergency, call 911. . Seek medical care right away. Before you go to a doctor's office, urgent care or emergency department, call  ahead and tell them about your recent travel, contact with someone diagnosed with COVID-19, and your symptoms. You should receive instructions from your physician's office regarding next steps of care.  . When you arrive at healthcare provider, tell the healthcare staff immediately you have returned from visiting [place], [place], [place], [place] or [place]; or traveled in the [place] to [place], [place], [place], or [place]; in the last two weeks or you have been in close contact with a person diagnosed with COVID-19 in the last 2 weeks.   . Tell the health care staff about your symptoms: fever, cough and shortness of breath. . After you have been seen by a medical provider, you will be either: o Tested for (COVID-19) and discharged home on quarantine except to seek medical care if symptoms worsen, and asked to  - Stay home and avoid contact with others until you get your results (4-5 days)  - Avoid travel on public transportation if possible (such as bus, train, or airplane) or o Sent to the Emergency Department by EMS for evaluation, COVID-19 testing, and possible admission depending on your condition and test results.  What to do if you are LOW RISK   for COVID-19?  Reduce your risk of any infection by using the same precautions used for avoiding the common cold or flu:  . Wash your hands often with soap and warm water for at least 20 seconds.  If soap and water are not readily available, use an alcohol-based hand sanitizer with at least 60% alcohol.  . If coughing or sneezing, cover your mouth and nose by coughing or sneezing into the elbow areas of your shirt or coat, into a tissue or into your sleeve (not your hands). . Avoid shaking hands with others and consider head nods or verbal greetings only. . Avoid touching your eyes, nose, or mouth with unwashed hands.  . Avoid close contact with people who are sick. . Avoid places or events with large numbers of people in one location,  like concerts or sporting events. . Carefully consider travel plans you have or are making. . If you are planning any travel outside or inside the [place], visit the CDC's Travelers' Health webpage for the latest health notices. . If you have some symptoms but not all symptoms, continue to monitor at home and seek medical attention if your symptoms worsen. . If you are having a medical emergency, call 911.   ADDITIONAL HEALTHCARE OPTIONS FOR PATIENTS  [facility] / e-Visit: [email or web address]         [facility]: [phone]  [facility]: [phone]                   [facility]: [phone]   

## 2018-10-24 NOTE — Telephone Encounter (Signed)
Lm for rtc 

## 2018-10-24 NOTE — Telephone Encounter (Signed)
PT from [facility] requesting VO  For PT 2 time a week for 3 weeks for lower ext strengthning gait and balance.

## 2018-10-24 NOTE — Progress Notes (Signed)
   CC: Leg cramping and soreness  HPI:  [name] is a 78 y.o. female with gastric avm, HHT, PAF, htn, pvd, pulm htn, dm2, ckd3 who presents to be evaluated for leg cramping. Please see problem based charting for evaluation, assessment, and plan.  Past Medical History:  Diagnosis Date  . Chronic anemia   . Chronic diastolic CHF (congestive heart failure) ([place]) 10/03/2013  . Chronic GI bleeding    [name] 11/29/2014  . Family history of anesthesia complication    "niece has a hard time coming out" (09/15/2012)  . Frequent nosebleeds    chronic  . Gastric AV malformation    [name] 11/29/2014  . GERD (gastroesophageal reflux disease)   . 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 History of blood transfusion "several"  . History of epistaxis   . HTN (hypertension), benign 03/02/2012  . Hyperlipidemia   . Iron deficiency anemia    chronic infusions"  . Lichen planus    Both lower extremities  . 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 Overgrown toenails 03/18/2017  . Pneumonia 1990's X 2  . Pulmonary HTN ([place]) 04/01/2017   PASP 39mmHg on echo 09/2016 and 15mmHg by echo 2019  . Seizures ([place]) 09/2014  . Symptomatic anemia 11/29/2014  . Telangiectasia    Gastric   . 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   Review of Systems:    Review of Systems  Constitutional: Positive for malaise/fatigue. Negative for chills and fever.  Respiratory: Negative for cough.   Cardiovascular: Negative for chest pain.  Gastrointestinal: Negative for abdominal pain, nausea and vomiting.  Musculoskeletal: Positive for myalgias.  Neurological: Negative for dizziness and headaches.   Physical Exam:  Vitals:   10/24/18 1546  BP: (!) 165/54  Pulse: (!) 55  Temp: 98.5 F (36.9 C)  TempSrc: Oral  SpO2: 100%  Weight: 144 lb 4.8 oz (65.5 kg)  Height: 5\' 3"  (1.6 m)   Physical Exam  Constitutional: Appears well-developed and well-nourished. No  distress.  HENT:  Head: Normocephalic and atraumatic.  Eyes: Conjunctivae are normal.  Cardiovascular: Normal rate, regular rhythm and normal heart sounds.  Respiratory: Effort normal and breath sounds normal. No respiratory distress. No wheezes.  GI: Soft. Bowel sounds are normal. No distension. There is no tenderness.  Musculoskeletal: No edema.  Neurological: Is alert.  Skin: Not diaphoretic. No erythema.  Psychiatric: Normal mood and affect. Behavior is normal. Judgment and thought content normal.   Assessment & Plan:   See Encounters Tab for problem based charting.  Patient discussed with Dr. Angelia Mould

## 2018-10-25 ENCOUNTER — Encounter: Payer: Medicare Other | Admitting: Endocrinology

## 2018-10-25 ENCOUNTER — Other Ambulatory Visit: Payer: Self-pay | Admitting: Internal Medicine

## 2018-10-25 LAB — IRON: Iron: 87 ug/dL (ref 27–139)

## 2018-10-25 LAB — BMP8+ANION GAP
Anion Gap: 17 mmol/L (ref 10.0–18.0)
BUN/Creatinine Ratio: 21 (ref 12–28)
BUN: 25 mg/dL (ref 8–27)
CO2: 18 mmol/L — ABNORMAL LOW (ref 20–29)
Calcium: 9.6 mg/dL (ref 8.7–10.3)
Chloride: 107 mmol/L — ABNORMAL HIGH (ref 96–106)
Creatinine, Ser: 1.2 mg/dL — ABNORMAL HIGH (ref 0.57–1.00)
GFR calc Af Amer: 50 mL/min/{1.73_m2} — ABNORMAL LOW (ref >59)
GFR calc non Af Amer: 43 mL/min/{1.73_m2} — ABNORMAL LOW (ref >59)
Glucose: 164 mg/dL — ABNORMAL HIGH (ref 65–99)
Potassium: 5.4 mmol/L — ABNORMAL HIGH (ref 3.5–5.2)
Sodium: 142 mmol/L (ref 134–144)

## 2018-10-25 LAB — VITAMIN B12: Vitamin B-12: 1615 pg/mL — ABNORMAL HIGH (ref 232–1245)

## 2018-10-25 MED ORDER — SODIUM POLYSTYRENE SULFONATE 15 GM/60ML PO SUSP: 15.0000 g | Freq: Every day | ORAL | 0 refills | Status: AC

## 2018-10-25 NOTE — Telephone Encounter (Signed)
Gave VO for PT for 2x week for 3 weeks for strengthening, balance, conditioning and safety

## 2018-10-25 NOTE — Assessment & Plan Note (Signed)
The patient presents with few month history of bilateral leg cramping and soreness. She states that she has had symptoms like this in the past.   She does not have any numbness or tingling. Denies any trauma.   Assessment and plan  Will evaluate for electrolyte, iron, and blood count abnormalities.   -bmp  -iron studies -vitamin b12 -glucose  ADDENDUM The patient has vitamin b12, iron, sodium, and calcium within normal levels. Her potassium is mildly elevated at 5.4 which can explain her symptoms.  -Prescribed kayexelate 15g for 2 doses  -follow up for patient on 9/18 am to recheck bmp

## 2018-10-25 NOTE — Progress Notes (Signed)
This encounter was created in error - please disregard.

## 2018-10-25 NOTE — Progress Notes (Signed)
Kayexelate prescription has been sent. Patient was called and notified. She will have acc follow up 9/17 AM.    Lars Mage, MD Internal Medicine PGY3 [facility]:[phone] 10/25/2018, 4:25 PM

## 2018-10-26 ENCOUNTER — Telehealth: Payer: Self-pay

## 2018-10-26 NOTE — Telephone Encounter (Signed)
[name] with [facility] requesting VO for OT, please call back.

## 2018-10-26 NOTE — Telephone Encounter (Signed)
OT HH  1x week for 4 weeks for safety, ADL's VO given, do you agree?

## 2018-10-26 NOTE — Progress Notes (Signed)
Internal Medicine Clinic Attending  Case discussed with Dr. Chundi at the time of the visit.  We reviewed the resident's history and exam and pertinent patient test results.  I agree with the assessment, diagnosis, and plan of care documented in the resident's note. 

## 2018-10-27 ENCOUNTER — Other Ambulatory Visit: Payer: Self-pay | Admitting: Internal Medicine

## 2018-10-27 ENCOUNTER — Ambulatory Visit: Payer: Medicare Other

## 2018-10-27 DIAGNOSIS — E1165 Type 2 diabetes mellitus with hyperglycemia: Secondary | ICD-10-CM

## 2018-10-27 NOTE — Telephone Encounter (Signed)
Agree. Thanks

## 2018-11-01 ENCOUNTER — Other Ambulatory Visit: Payer: Self-pay | Admitting: Internal Medicine

## 2018-11-03 NOTE — Progress Notes (Signed)
[place]   [phone] [phone]   Clinic Follow up Note   Patient Care Team: Delice Bison, DO as PCP - General [name], MD as PCP - Cardiology (Cardiology)  Date of Service:  11/07/2018  CHIEF COMPLAINT: F/u ofAnemia due to chronic blood loss, HHT  HEMATOLOGY HISTORY 1.Hereditary hemorrhagic telangiectasia (HHT),with epistaxis andfrequentGI bleeding. Previously under care of Dr. [name] 2.Anemia secondary to GI bleeding and iron deficiency, required blood transfusion every 3 to 4 weeks, IV Feraheme every 1 to 2 months  CURRENT THERAPY 1. Iv feraheme as needed if ferritin<50 2.Avastin59m/kg every 2 weeks, starting3/30/20. Reduced to every 4 weeks starting 11/07/18.  3. Blood transfusion if Hg<8.0  INTERVAL HISTORY:  BCerria [name] is here for a follow up anemia. She presents to the clinic alone. She called her husband to be included in the visit. She notes she has been doing fair. She feels her energy level is fair as well. She feels Avastin is helping her. She denies any side effects from Avastin. She notes she still has leg pain which is at 5-6/10. Her PCP gave her Percocet on 10/12/18. She ran out and did not have refill.    REVIEW OF SYSTEMS:   Constitutional: Denies fevers, chills or abnormal weight loss Eyes: Denies blurriness of vision Ears, nose, mouth, throat, and face: Denies mucositis or sore throat Respiratory: Denies cough, dyspnea or wheezes Cardiovascular: Denies palpitation, chest discomfort or lower extremity swelling Gastrointestinal:  Denies nausea, heartburn or change in bowel habits Skin: Denies abnormal skin rashes MSK: (+) stable b/l leg pain, 5-6/10 Lymphatics: Denies new lymphadenopathy or easy bruising Neurological:Denies numbness, tingling or new weaknesses Behavioral/Psych: Mood is stable, no new changes  All other systems were reviewed with the patient and are negative.  MEDICAL  HISTORY:  Past Medical History:  Diagnosis Date  . Chronic anemia   . Chronic diastolic CH[place] ([place]estive heart [place]) [place] 10/03/2013  . Chronic GI bleeding    /[name]10/20/2016  . Family history of anesthesia complication    "niece has a hard time coming out" (09/15/2012)  . Frequent nosebleeds    chronic  . Gastric AV malformation    /[name]10/20/2016  . GERD (gastroesophageal reflux disease)   . 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 History of blood transfusion "several"  . History of epistaxis   . HTN (hypertension), benign 03/02/2012  . Hyperlipidemia   . Iron deficiency anemia    chronic infusions"  . Lichen planus    Both lower extremities  . 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    /[name]10/20/2016  . Overgrown toenails 03/18/2017  . Pneumonia 1990's X 2  . [place] HTN [place] 04/01/2017   PASP 549mg on echo 09/2016 and 4569m by echo 2019  . [place]  . Symptomatic anemia 11/29/2014  . Telangiectasia    Gastric   . 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    SURGICAL HISTORY: Past Surgical History:  Procedure Laterality Date  . CATARACT EXTRACTION     "I think it was just one eye"  . ESOPHAGOGASTRODUODENOSCOPY  02/26/2011   Procedure: ESOPHAGOGASTRODUODENOSCOPY (EGD);  Surgeon: JohMissy SabinsD;  [facility];  Service: Endoscopy;  Laterality: N/A;  . ESOPHAGOGASTRODUODENOSCOPY N/A 11/08/2012   Procedure: ESOPHAGOGASTRODUODENOSCOPY (EGD);  Surgeon: PatBeryle BeamsD;  [facility];  Service: Endoscopy;  Laterality: N/A;  . ESOPHAGOGASTRODUODENOSCOPY N/A 10/04/2013   Procedure: ESOPHAGOGASTRODUODENOSCOPY (EGD);  Surgeon: Winfield Cunas., MD;  [name];  Service: Endoscopy;  Laterality: N/A;  with APC on stand-by  . ESOPHAGOGASTRODUODENOSCOPY N/A 07/06/2014   Procedure: ESOPHAGOGASTRODUODENOSCOPY (EGD);  Surgeon: Clarene Essex, MD;  [name];  Service: Endoscopy;  Laterality:  N/A;  . ESOPHAGOGASTRODUODENOSCOPY N/A 09/05/2014   Procedure: ESOPHAGOGASTRODUODENOSCOPY (EGD);  Surgeon: Laurence Spates, MD;  [name];  Service: Endoscopy;  Laterality: N/A;  APC on standby to control bleeding  . ESOPHAGOGASTRODUODENOSCOPY N/A 11/29/2014   Procedure: ESOPHAGOGASTRODUODENOSCOPY (EGD);  Surgeon: [name], MD;  Location[facility] END[facility]COPY;  Service: Endoscopy;  Laterality: N/A;  . ESOPHAGOGASTRODUODENOSCOPY N/A 09/28/2015   Procedure: ESOPHAGOGASTRODUODENOSCOPY (EGD);  Surgeon: Clarene Essex, MD;  Location: [facility] ENDOSCOPY;  Service: Endoscopy;  Laterality: N/A;  . ESOPHAGOGASTRODUODENOSCOPY (EGD) WITH PROPOFOL N/A 12/04/2017   Procedure: ESOPHAGOGASTRODUODENOSCOPY (EGD) WITH PROPOFOL;  Surgeon: [name], MD;  Location: [place];  Service: Endoscopy;  Laterality: N/A;  . ESOPHAGOGASTRODUODENOSCOPY ENDOSCOPY  08/19/2006   with laser treatment  . HOT HEMOSTASIS N/A 07/06/2014   Procedure: HOT HEMOSTASIS (ARGON PLASMA COAGULATION/BICAP);  Surgeon: Clarene Essex, MD;  [name];  Service: Endoscopy;  Laterality: N/A;  . HOT HEMOSTASIS N/A 09/28/2015   Procedure: HOT HEMOSTASIS (ARGON PLASMA COAGULATION/BICAP);  Surgeon: Clarene Essex, MD;  Location: [facility] ENDOSCOPY;  Service: Endoscopy;  Laterality: N/A;  . HOT HEMOSTASIS N/A 12/04/2017   Procedure: HOT HEMOSTASIS (ARGON PLASMA COAGULATION/BICAP);  Surgeon: [name], MD;  Location: [place];  Service: Endoscopy;  Laterality: N/A;  . NASAL HEMORRHAGE CONTROL     "for bleeding"   . SAVORY DILATION  02/26/2011   Procedure: SAVORY DILATION;  Surgeon: [name], MD;  [name];  Service: Endoscopy;  Laterality: N/A;  c-arm needed  . SUBMUCOSAL INJECTION  12/04/2017   Procedure: SUBMUCOSAL INJECTION;  Surgeon: [name], MD;  Location: [facility] ENDOSCOPY;  Service: Endoscopy;;    I have reviewed the social history and family history with the patient and they are unchanged from  previous note.  ALLERGIES:  is allergic to aspirin.  MEDICATIONS:  Current Outpatient Medications  Medication Sig Dispense Refill  . b complex vitamins capsule Take 1 capsule by mouth daily. 100 capsule 2  . Blood Glucose Monitoring Suppl (ONETOUCH VERIO) w/Device KIT Use to check blood sugar up to 4 times a day 1 kit 1  . calcium-vitamin D (OSCAL WITH D) 500-200 MG-UNIT tablet Take 1 tablet by mouth 2 (two) times daily.    . cetaphil (CETAPHIL) lotion Apply 1 application topically as needed for dry skin. 236 mL 0  . donepezil (ARICEPT) 10 MG tablet Take 1 tablet (10 mg total) by mouth at bedtime. 90 tablet 4  . Elastic Bandages & Supports (MEDICAL COMPRESSION STOCKINGS) MISC Wear as much as possible while awake to reduce swelling 2 each 0  . FERROCITE 324 MG TABS tablet Take 1 tablet by mouth twice daily 60 tablet 0  . furosemide (LASIX) 20 MG tablet Take 1 tablet (20 mg total) by mouth daily. 90 tablet 3  . glucose blood (ONETOUCH VERIO) test strip Use as instructed 4 times daily. E11.29, insulin requiring 350 each 3  . insulin lispro (HUMALOG KWIKPEN) 100 UNIT/ML KwikPen Inject 3 units under the skin three times daily before each meal. May increase up to 6 units before meals if needed. 15 mL 1  . Insulin Pen Needle (B-D UF III MINI PEN NEEDLES) 31G X 5 MM MISC USE TO INJECT INSULIN  3 TIMES DAILY 100 each 0  .  lactulose (CHRONULAC) 10 GM/15ML solution TAKE 15 ML BY MOUTH TWO TIMES DAILY 473 mL 2  . levETIRAcetam (KEPPRA) 500 MG tablet Take 1 tablet (500 mg total) by mouth 2 (two) times daily. 180 tablet 4  . memantine (NAMENDA) 10 MG tablet Take 1 tablet (10 mg total) by mouth 2 (two) times daily. 180 tablet 4  . ONETOUCH DELICA LANCETS FINE MISC Check blood sugar up to 4 times a day 300 each 4  . oxycodone-acetaminophen (PERCOCET) 2.5-325 MG tablet Take 1 tablet by mouth at bedtime as needed for pain. 30 tablet 0  . oxymetazoline (AFRIN) 0.05 % nasal spray Place 1 spray into both nostrils  2 (two) times daily as needed (Epistaxis). 30 mL 0  . pantoprazole (PROTONIX) 40 MG tablet Take 1 tablet by mouth once daily 180 tablet 0  . sodium chloride (OCEAN) 0.65 % SOLN nasal spray Place 1 spray into both nostrils as needed for congestion. 60 mL 0  . TRESIBA FLEXTOUCH 100 UNIT/ML SOPN FlexTouch Pen INJECT 21 UNITS SUBCUTANEOUSLY IN THE EVENING 15 mL 0  . triamcinolone ointment (KENALOG) 0.5 % APPLY 1 APPLICATION TOPICALLY TO RASH TWICE DAILY FOR ITCHING (Patient taking differently: Apply 1 application topically 2 (two) times daily as needed (itching). ) 15 g 1  . TRULICITY 1.5 BO/1.7PZ SOPN INJECT 1.5 MG INTO THE SKIN ONCE A WEEK. EVERY TUESDAY (Patient taking differently: Inject 1.5 mg as directed every Tuesday. ) 8 mL 0   No current facility-administered medications for this visit.     PHYSICAL EXAMINATION: ECOG PERFORMANCE STATUS: 2 - Symptomatic, <50% confined to bed  Vitals:   11/07/18 0935  BP: (!) 148/57  Pulse: 92  Resp: 18  Temp: 99.3 F (37.4 C)  SpO2: 93%   Filed Weights   11/07/18 0935  Weight: 150 lb 1.6 oz (68.1 kg)    GENERAL:alert, no distress and comfortable SKIN: skin color, texture, turgor are normal, no rashes or significant lesions EYES: normal, Conjunctiva are pink and non-injected, sclera clear NECK: supple, thyroid normal size, non-tender, without nodularity LYMPH:  no palpable lymphadenopathy in the cervical, axillary  LUNGS: clear to auscultation and percussion with normal breathing effort HEART: regular rate & rhythm and no murmurs and no lower extremity edema ABDOMEN:abdomen soft, non-tender and normal bowel sounds Musculoskeletal:no cyanosis of digits and no clubbing  NEURO: alert & oriented x 3 with fluent speech, no focal motor/sensory deficits  LABORATORY DATA:  I have reviewed the data as listed CBC Latest Ref Rng & Units 11/07/2018 10/24/2018 10/18/2018  WBC 4.0 - 10.5 K/uL 5.3 5.7 5.9  Hemoglobin 12.0 - 15.0 g/dL 12.9 12.3 13.0   Hematocrit 36.0 - 46.0 % 38.2 36.6 39.0  Platelets 150 - 400 K/uL 141(L) 135(L) 161     CMP Latest Ref Rng & Units 10/24/2018 10/18/2018 10/18/2018  Glucose 65 - 99 mg/dL 164(H) - 64(L)  BUN 8 - 27 mg/dL 25 - 31(H)  Creatinine 0.57 - 1.00 mg/dL 1.20(H) 1.37(H) 1.44(H)  Sodium 134 - 144 mmol/L 142 - 144  Potassium 3.5 - 5.2 mmol/L 5.4(H) 4.3 5.4(H)  Chloride 96 - 106 mmol/L 107(H) - 107  CO2 20 - 29 mmol/L 18(L) - 29  Calcium 8.7 - 10.3 mg/dL 9.6 - 9.5  Total Protein 6.5 - 8.1 g/dL - - -  Total Bilirubin 0.3 - 1.2 mg/dL - - -  Alkaline Phos 38 - 126 U/L - - -  AST 15 - 41 U/L - - -  ALT 0 - 44  U/L - - -      RADIOGRAPHIC STUDIES: I have personally reviewed the radiological images as listed and agreed with the findings in the report. No results found.   ASSESSMENT & PLAN:  [name] is a 78 y.o. female with   1.Hereditary hemorrhagic telangiectasia(HHT)), withepistaxis and frequent GI bleeding -She has mild epistaxis, and frequent GI bleeding, she used to require blood transfusion every 3 to 4 weeks. Her last blood transfusion was on 04/13/2018 -She started Avastin for q2weeks on 05/09/18.Sheis tolerating well and responding well. She has not required Blood transfusion since she started treatment and anemia resolved.  -Labs reviewed, CBC WNL except plt 141K, lymphocytes 0.5. Urine protein and Ferritin still pending.  -Given good response to Avastin will reduce to every 4 weeks starting today. She is agreeable.  -continue IVFeraheme if ferritin<50 -Continue oral iron.  -F/u in 2 months    2. 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 She has not been seen in some time. 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   5. 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 Fabiola Backer percocet, I do not plan to refill and have not. She was also previously on tramadol.  -She continues to have stable leg pain that effects her walking. I discussed her pain is likely mainly caused by her uncontrolled DM.  -Lately she has been having b/l diffuse leg pain. She mainly gets around by wheelchair.  -Her PCP gave her percocet on 10/12/18. I encouraged her to reach out to her PCP for refill. Her pain is stable.   6. DM,CHFand CKD stage III -She is on insulin, DM not well controlled -She monitors her blood glucose at home -CKD andCHF stable clinically  7. Elevated BP -She has not been on HTN medication before.  -BP at 148/57 today (11/08/18). Has been elevated in clinic lately.  -I discussed Avastin can cause higher BP. I encouraged her to watch her BP at home and f/u with her PCP.    Plan -Labs reviewed and adequate to proceed with Avastin today  -Continua oral iron  -Lab, flush, and Avastin in 1 and 2 months, Feraheme if ferritin<50  -F/u in 2 months  -I called her husband today during visit  -Ferritin 58 today, plan to give Feraheme next time    No problem-specific Assessment & Plan notes found for this encounter.   No orders of the defined types were placed in this encounter.  All questions were answered. The patient knows to call the clinic with any problems, questions or concerns. No barriers to learning was detected. I spent 20 minutes counseling the patient face to  face. The total time spent in the appointment was 25 minutes and more than 50% was on counseling and review of test results     Truitt Merle, MD 11/07/2018   I, Joslyn Devon, am acting  as scribe for Truitt Merle, MD.   I have reviewed the above documentation for accuracy and completeness, and I agree with the above.

## 2018-11-03 NOTE — Addendum Note (Signed)
Addended by: Hulan Fray on: 11/03/2018 04:10 PM   Modules accepted: Orders

## 2018-11-07 ENCOUNTER — Inpatient Hospital Stay (HOSPITAL_BASED_OUTPATIENT_CLINIC_OR_DEPARTMENT_OTHER): Payer: Medicare Other | Admitting: Hematology

## 2018-11-07 ENCOUNTER — Encounter: Payer: Self-pay | Admitting: Hematology

## 2018-11-07 ENCOUNTER — Inpatient Hospital Stay: Payer: Medicare Other

## 2018-11-07 ENCOUNTER — Other Ambulatory Visit: Payer: Self-pay

## 2018-11-07 ENCOUNTER — Telehealth: Payer: Self-pay | Admitting: Hematology

## 2018-11-07 VITALS — BP 148/57 | HR 92 | Temp 99.3°F | Resp 18 | Ht 63.0 in | Wt 150.1 lb

## 2018-11-07 VITALS — BP 142/56

## 2018-11-07 DIAGNOSIS — I780 Hereditary hemorrhagic telangiectasia: Secondary | ICD-10-CM | POA: Diagnosis not present

## 2018-11-07 DIAGNOSIS — Z95828 Presence of other vascular implants and grafts: Secondary | ICD-10-CM

## 2018-11-07 DIAGNOSIS — K31819 Angiodysplasia of stomach and duodenum without bleeding: Secondary | ICD-10-CM

## 2018-11-07 DIAGNOSIS — D500 Iron deficiency anemia secondary to blood loss (chronic): Secondary | ICD-10-CM

## 2018-11-07 LAB — CBC WITH DIFFERENTIAL (CANCER CENTER ONLY)
Abs Immature Granulocytes: 0.01 10*3/uL (ref 0.00–0.07)
Basophils Absolute: 0 10*3/uL (ref 0.0–0.1)
Basophils Relative: 1 %
Eosinophils Absolute: 0.5 10*3/uL (ref 0.0–0.5)
Eosinophils Relative: 9 %
HCT: 38.2 % (ref 36.0–46.0)
Hemoglobin: 12.9 g/dL (ref 12.0–15.0)
Immature Granulocytes: 0 %
Lymphocytes Relative: 10 %
Lymphs Abs: 0.5 10*3/uL — ABNORMAL LOW (ref 0.7–4.0)
MCH: 30.5 pg (ref 26.0–34.0)
MCHC: 33.8 g/dL (ref 30.0–36.0)
MCV: 90.3 fL (ref 80.0–100.0)
Monocytes Absolute: 0.6 10*3/uL (ref 0.1–1.0)
Monocytes Relative: 10 %
Neutro Abs: 3.7 10*3/uL (ref 1.7–7.7)
Neutrophils Relative %: 70 %
Platelet Count: 141 10*3/uL — ABNORMAL LOW (ref 150–400)
RBC: 4.23 MIL/uL (ref 3.87–5.11)
RDW: 12.7 % (ref 11.5–15.5)
WBC Count: 5.3 10*3/uL (ref 4.0–10.5)
nRBC: 0 % (ref 0.0–0.2)

## 2018-11-07 LAB — FERRITIN: Ferritin: 58 ng/mL (ref 11–307)

## 2018-11-07 LAB — TOTAL PROTEIN, URINE DIPSTICK: Protein, ur: NEGATIVE mg/dL

## 2018-11-07 MED ORDER — SODIUM CHLORIDE 0.9% FLUSH
10.0000 mL | INTRAVENOUS | Status: DC | PRN
  Administered 2018-11-07: 09:00:00 10 mL
  Filled 2018-11-07: qty 10

## 2018-11-07 MED ORDER — HEPARIN SOD (PORK) LOCK FLUSH 100 UNIT/ML IV SOLN
500.0000 [IU] | Freq: Once | INTRAVENOUS | Status: AC | PRN
  Administered 2018-11-07: 500 [IU]
  Filled 2018-11-07: qty 5

## 2018-11-07 MED ORDER — SODIUM CHLORIDE 0.9 % IV SOLN
4.6000 mg/kg | Freq: Once | INTRAVENOUS | Status: AC
  Administered 2018-11-07: 11:00:00 300 mg via INTRAVENOUS
  Filled 2018-11-07: qty 12

## 2018-11-07 MED ORDER — SODIUM CHLORIDE 0.9% FLUSH
10.0000 mL | INTRAVENOUS | Status: DC | PRN
  Administered 2018-11-07: 11:00:00 10 mL
  Filled 2018-11-07: qty 10

## 2018-11-07 MED ORDER — SODIUM CHLORIDE 0.9 % IV SOLN
Freq: Once | INTRAVENOUS | Status: AC
  Administered 2018-11-07: 10:00:00 via INTRAVENOUS
  Filled 2018-11-07: qty 250

## 2018-11-07 NOTE — Telephone Encounter (Signed)
Cancelled appts per 9/28 los.

## 2018-11-07 NOTE — Patient Instructions (Signed)
[facility] Discharge Instructions for Patients Receiving Chemotherapy  Today you received the following chemotherapy agents Avastin  To help prevent nausea and vomiting after your treatment, we encourage you to take your nausea medication as directed   If you develop nausea and vomiting that is not controlled by your nausea medication, call the clinic.   BELOW ARE SYMPTOMS THAT SHOULD BE REPORTED IMMEDIATELY:  *FEVER GREATER THAN 100.5 F  *CHILLS WITH OR WITHOUT FEVER  NAUSEA AND VOMITING THAT IS NOT CONTROLLED WITH YOUR NAUSEA MEDICATION  *UNUSUAL SHORTNESS OF BREATH  *UNUSUAL BRUISING OR BLEEDING  TENDERNESS IN MOUTH AND THROAT WITH OR WITHOUT PRESENCE OF ULCERS  *URINARY PROBLEMS  *BOWEL PROBLEMS  UNUSUAL RASH Items with * indicate a potential emergency and should be followed up as soon as possible.  Feel free to call the clinic should you have any questions or concerns. The clinic phone number is [phone].  Please show the CHEMO ALERT CARD at check-in to the Emergency Department and triage nurse.   

## 2018-11-14 ENCOUNTER — Other Ambulatory Visit: Payer: Self-pay

## 2018-11-14 ENCOUNTER — Encounter: Payer: Self-pay | Admitting: Family Medicine

## 2018-11-14 ENCOUNTER — Telehealth: Payer: Self-pay | Admitting: Family Medicine

## 2018-11-14 ENCOUNTER — Ambulatory Visit (INDEPENDENT_AMBULATORY_CARE_PROVIDER_SITE_OTHER): Payer: Medicare Other | Admitting: Family Medicine

## 2018-11-14 ENCOUNTER — Ambulatory Visit: Payer: Medicare Other | Admitting: Adult Health

## 2018-11-14 VITALS — BP 149/63 | HR 47 | Temp 97.8°F | Ht 63.0 in | Wt 149.2 lb

## 2018-11-14 DIAGNOSIS — R413 Other amnesia: Secondary | ICD-10-CM

## 2018-11-14 DIAGNOSIS — R569 Unspecified convulsions: Secondary | ICD-10-CM

## 2018-11-14 MED ORDER — DONEPEZIL HCL 10 MG PO TABS: 10.0000 mg | ORAL_TABLET | Freq: Every day | ORAL | 4 refills | Status: DC

## 2018-11-14 MED ORDER — MEMANTINE HCL 10 MG PO TABS: 10.0000 mg | ORAL_TABLET | Freq: Two times a day (BID) | ORAL | 4 refills | Status: DC

## 2018-11-14 MED ORDER — LEVETIRACETAM 500 MG PO TABS: 500.0000 mg | ORAL_TABLET | Freq: Two times a day (BID) | ORAL | 4 refills | Status: DC

## 2018-11-14 NOTE — Patient Instructions (Addendum)
Continue levetiracetam 500mg  twice daily   Continue Aricept 10mg  daily  Follow up in 1 year, sooner if needed.   Memory Compensation Strategies  1. Use "WARM" strategy.  W= write it down  A= associate it  R= repeat it  M= make a mental note  2.   You can keep a Social worker.  Use a 3-ring notebook with sections for the following: calendar, important names and phone numbers,  medications, doctors' names/phone numbers, lists/reminders, and a section to journal what you did  each day.   3.    Use a calendar to write appointments down.  4.    Write yourself a schedule for the day.  This can be placed on the calendar or in a separate section of the Memory Notebook.  Keeping a  regular schedule can help memory.  5.    Use medication organizer with sections for each day or morning/evening pills.  You may need help loading it  6.    Keep a basket, or pegboard by the door.  Place items that you need to take out with you in the basket or on the pegboard.  You may also want to  include a message board for reminders.  7.    Use sticky notes.  Place sticky notes with reminders in a place where the task is performed.  For example: " turn off the  stove" placed by the stove, "lock the door" placed on the door at eye level, " take your medications" on  the bathroom mirror or by the place where you normally take your medications.  8.    Use alarms/timers.  Use while cooking to remind yourself to check on food or as a reminder to take your medicine, or as a  reminder to make a call, or as a reminder to perform another task, etc.   Memantine Tablets What is this medicine? MEMANTINE (MEM an teen) is used to treat dementia caused by Alzheimer's disease. This medicine may be used for other purposes; ask your health care provider or pharmacist if you have questions. COMMON BRAND NAME(S): Namenda What should I tell my health care provider before I take this medicine? They need to know if you have  any of these conditions:  difficulty passing urine  kidney disease  liver disease  seizures  an unusual or allergic reaction to memantine, other medicines, foods, dyes, or preservatives  pregnant or trying to get pregnant  breast-feeding How should I use this medicine? Take this medicine by mouth with a glass of water. Follow the directions on the prescription label. You may take this medicine with or without food. Take your doses at regular intervals. Do not take your medicine more often than directed. Continue to take your medicine even if you feel better. Do not stop taking except on the advice of your doctor or health care professional. Talk to your pediatrician regarding the use of this medicine in children. Special care may be needed. Overdosage: If you think you have taken too much of this medicine contact a poison control center or emergency room at once. NOTE: This medicine is only for you. Do not share this medicine with others. What if I miss a dose? If you miss a dose, take it as soon as you can. If it is almost time for your next dose, take only that dose. Do not take double or extra doses. If you do not take your medicine for several days, contact your health care provider.  Your dose may need to be changed. What may interact with this medicine?  acetazolamide  amantadine  cimetidine  dextromethorphan  dofetilide  hydrochlorothiazide  ketamine  metformin  methazolamide  quinidine  ranitidine  sodium bicarbonate  triamterene This list may not describe all possible interactions. Give your health care provider a list of all the medicines, herbs, non-prescription drugs, or dietary supplements you use. Also tell them if you smoke, drink alcohol, or use illegal drugs. Some items may interact with your medicine. What should I watch for while using this medicine? Visit your doctor or health care professional for regular checks on your progress. Check with your  doctor or health care professional if there is no improvement in your symptoms or if they get worse. You may get drowsy or dizzy. Do not drive, use machinery, or do anything that needs mental alertness until you know how this drug affects you. Do not stand or sit up quickly, especially if you are an older patient. This reduces the risk of dizzy or fainting spells. Alcohol can make you more drowsy and dizzy. Avoid alcoholic drinks. What side effects may I notice from receiving this medicine? Side effects that you should report to your doctor or health care professional as soon as possible:  allergic reactions like skin rash, itching or hives, swelling of the face, lips, or tongue  agitation or a feeling of restlessness  depressed mood  dizziness  hallucinations  redness, blistering, peeling or loosening of the skin, including inside the mouth  seizures  vomiting Side effects that usually do not require medical attention (report to your doctor or health care professional if they continue or are bothersome):  constipation  diarrhea  headache  nausea  trouble sleeping This list may not describe all possible side effects. Call your doctor for medical advice about side effects. You may report side effects to FDA at 1-800-FDA-1088. Where should I keep my medicine? Keep out of the reach of children. Store at room temperature between 15 degrees and 30 degrees C (59 degrees and 86 degrees F). Throw away any unused medicine after the expiration date. NOTE: This sheet is a summary. It may not cover all possible information. If you have questions about this medicine, talk to your doctor, pharmacist, or health care provider.  2020 Elsevier/Gold Standard (2012-11-14 14:10:42)   Seizure, Adult A seizure is a sudden burst of abnormal electrical activity in the brain. Seizures usually last from 30 seconds to 2 minutes. They can cause many different symptoms. Usually, seizures are not harmful  unless they last a long time. What are the causes? Common causes of this condition include:  Fever or infection.  Conditions that affect the brain, such as: ? A brain abnormality that you were born with. ? A brain or head injury. ? Bleeding in the brain. ? A tumor. ? Stroke. ? Brain disorders such as autism or cerebral palsy.  Low blood sugar.  Conditions that are passed from parent to child (are inherited).  Problems with substances, such as: ? Having a reaction to a drug or a medicine. ? Suddenly stopping the use of a substance (withdrawal). In some cases, the cause may not be known. A person who has repeated seizures over time without a clear cause has a condition called epilepsy. What increases the risk? You are more likely to get this condition if you have:  A family history of epilepsy.  Had a seizure in the past.  A brain disorder.  A history  of head injury, lack of oxygen at birth, or strokes. What are the signs or symptoms? There are many types of seizures. The symptoms vary depending on the type of seizure you have. Examples of symptoms during a seizure include:  Shaking (convulsions).  Stiffness in the body.  Passing out (losing consciousness).  Head nodding.  Staring.  Not responding to sound or touch.  Loss of bladder control and bowel control. Some people have symptoms right before and right after a seizure happens. Symptoms before a seizure may include:  Fear.  Worry (anxiety).  Feeling like you may vomit (nauseous).  Feeling like the room is spinning (vertigo).  Feeling like you saw or heard something before (dj vu).  Odd tastes or smells.  Changes in how you see. You may see flashing lights or spots. Symptoms after a seizure happens can include:  Confusion.  Sleepiness.  Headache.  Weakness on one side of the body. How is this treated? Most seizures will stop on their own in under 5 minutes. In these cases, no treatment is  needed. Seizures that last longer than 5 minutes will usually need treatment. Treatment can include:  Medicines given through an IV tube.  Avoiding things that are known to cause your seizures. These can include medicines that you take for another condition.  Medicines to treat epilepsy.  Surgery to stop the seizures. This may be needed if medicines do not help. Follow these instructions at home: Medicines  Take over-the-counter and prescription medicines only as told by your doctor.  Do not eat or drink anything that may keep your medicine from working, such as alcohol. Activity  Do not do any activities that would be dangerous if you had another seizure, like driving or swimming. Wait until your doctor says it is safe for you to do them.  If you live in the [place] ask your local DMV (department of motor vehicles) when you can drive.  Get plenty of rest. Teaching others Teach friends and family what to do when you have a seizure. They should:  Lay you on the ground.  Protect your head and body.  Loosen any tight clothing around your neck.  Turn you on your side.  Not hold you down.  Not put anything into your mouth.  Know whether or not you need emergency care.  Stay with you until you are better.  General instructions  Contact your doctor each time you have a seizure.  Avoid anything that gives you seizures.  Keep a seizure diary. Write down: ? What you think caused each seizure. ? What you remember about each seizure.  Keep all follow-up visits as told by your doctor. This is important. Contact a doctor if:  You have another seizure.  You have seizures more often.  There is any change in what happens during your seizures.  You keep having seizures with treatment.  You have symptoms of being sick or having an infection. Get help right away if:  You have a seizure that: ? Lasts longer than 5 minutes. ? Is different than seizures you had before. ?  Makes it harder to breathe. ? Happens after you hurt your head.  You have any of these symptoms after a seizure: ? Not being able to speak. ? Not being able to use a part of your body. ? Confusion. ? A bad headache.  You have two or more seizures in a row.  You do not wake up right after a seizure.  You  get hurt during a seizure. These symptoms may be an emergency. Do not wait to see if the symptoms will go away. Get medical help right away. Call your local emergency services (911 in the [place] Do not drive yourself to the hospital. Summary  Seizures usually last from 30 seconds to 2 minutes. Usually, they are not harmful unless they last a long time.  Do not eat or drink anything that may keep your medicine from working, such as alcohol.  Teach friends and family what to do when you have a seizure.  Contact your doctor each time you have a seizure. This information is not intended to replace advice given to you by your health care provider. Make sure you discuss any questions you have with your health care provider. Document Released: 07/15/2007 Document Revised: 04/15/2018 Document Reviewed: 04/15/2018 Elsevier Patient Education  [place].

## 2018-11-14 NOTE — Progress Notes (Signed)
PATIENT: [name] DOB: 07/13/1940  REASON FOR VISIT: follow up HISTORY FROM: patient  Chief Complaint  Patient presents with  . Follow-up    Yearly f/u. Alone. Rm 5. No new concerns at this time.      HISTORY OF PRESENT ILLNESS: Today 11/14/18 [name] is a 78 y.o. female here today for follow up of seizure. She continues levetiracetam '500mg'$  twice daily. She denies seizure activity since last being seen. Last seizure in 2016. She continues Aricpet '10mg'$  daily as well as Namenda 10 mg twice daily and is tolerating well. memory is stable. She does not drive. She lives with her husband. She is able to perform ADL's independently. No falls. She uses a cane and Rolator regularly. She is followed closely by PCP, cardiology and hematology.    HISTORY: (copied from [place] note on 11/10/2017)  [name] is a 78 year old female with a history of seizures and memory disturbance.  She returns today for follow-up.  She reports that she has not had any seizure events.  She lives at home with her husband.  She is able to complete all ADLs independently.  She does not operate a motor vehicle.  She reports that she does have trouble sleeping at night.  She often takes naps during the day because of this.  Reports good appetite.  States that she continues to prepare some meals.  Her husband manages all the finances.  She denies any changes in her mood or behavior.  Interval History10/03/2016:She is doing well, memory is stable, no new deficits, no falls, her legs hurt her and she is going to have a stent in her leg for artery disease. She is not depressed, mood is good, she feels good. No difficulty swallowing.  05/06/16 [name] a 78 year old female with a history of memory disturbance and seizures. She returns today for follow-up. She reports that she has been doing well. Denies any seizure events. She continues on Keppra 500 mg twice a day. She reports that her memory varies.  She lives at home with her husband. He is able to complete all ADLs independently. Her husband handles all the finances. He also helps her oversee her medication and appointments. She does not operate a motor vehicle. She is able to cook without difficulty. She states that some nights she is unable to sleep well. Denies any hallucinations. She returns today for an evaluation.  HISTORY 11/07/15: [name] is a 78 year old female with a history of memory disturbance. She returns today for follow-up. She feels that her memory has remained stable. She continues on Aricept. At the last visit she was started on Namenda. Reports that she is tolerating this well. She denies any seizure events. She remains on Keppra. She is able to complete most ADLs without assistance. She does not operate a motor vehicle. Denies any changes with her gait or balance. Denies any changes with her mood or behavior. Patient's husband is in the lobby but does not want to come to the exam room? Patient reports that she was admitted to the hospital in August due to a GI bleed. She denies any other medical history. Returns today for an evaluation.  08/07/15 (MM) : Ms. Steffey is a 78 year old female with a history of memory disturbance. She returns today for follow-up. She states that her memory has remained stable. She is tolerating Aricept well. She lives at home with her husband. She states that Keppra does make her sleepy throughout the day. She  is not had any seizure events. She does not operate a motor vehicle. She does prepare meals without difficulty. She states that she is trying to get Meals on Wheels but is on the wait list. She returns today for an evaluation.  01/30/15 [facility]): She is more forgetful. She forgets things she says, by the time she tella her husband something she has forgotten. They live independently. Pays all the bills, not skipping bills. No accidents in the home. She is losing things. Misplacing. Forgets  appointments. Difficulty with organization more. Can't get herself together. She sleeps a lot. No dementia, no alzheimers in the family.   HPI: [name] is a 78 y.o. female here as a referral from Dr. Jeanie Cooks for seizure in the setting of hyperammonemia. She has a past medical history of hypertension, congestive heart failure, diabetes, acute encephalopathy with status epilepticus, acute renal failure superimposed on stage III chronic kidney disease, urinary tract infections, hyperammonemia.  Patient here with husband. She was admitted with an episode of seizure-like activity. Husband provides assist of the information. He describes the incident. She was shaking her hands and head, trying to catch her breath. All information provided by husband. Lasted a few minutes. She was not responsive. It was on a Tuesday. Then she slept for 5 hours afterwards. Then she vomited while sleeping and then they called 911 and went to the emergency room. This is not the first time it has happened. It has happened in the past. Husband had seen the jerking of the limbs and then she would sleep a while and wake up. He says he has seen in twice in the last year. No family history of seizures. She is much better now. Therapy is coming to her house. Not having any side effects from the medication. She has had episodes of AMS and confusion in the past.   Reviewed notes, labs and imaging from outside physicians, which showed:  MRI of the brain 08/2014 personally reviewed images and agree with following report. : 1. No acute infarct identified. 2. Increased basal ganglia intrinsic T1 signal which can be seen with hepatic insufficiency, cirrhosis, portosystemic shunt, portal vein thrombosis, total parenteral nutrition, and is less often seen with hyperglycemia or other metabolic disturbances such as Wilson's disease. Correlation with serum ammonia levels and liver function tests is recommended. 3. Advanced but  nonspecific cerebral white matter signal changes. Most commonly this is due to chronic small vessel disease. Other considerations include hypercoagulable state, vasculitis, , prior infection or demyelination.  CBC showed anemia, anemia while hospitalized 327. Hepatic function was normal. One month previous she had a GI bleed. Lactulose was started for elevated ammonia. An EEG was ordered which was concerning for frontal status epilepticus. Hyperammonemia was thought to be the driver of the seizures however she was started on Keppra. She was discharged with the diagnosis of acute encephalopathy and hyperammonemia, she was discharged on Keppra 500 mg twice daily.  Patient presented to the emergency room in late August with altered mental status and lethargy. CT of the head imaging was unremarkable patient was afebrile, no leukocytosis, ammonia was 327, BUN was 32 and creatinine was 1.38.   REVIEW OF SYSTEMS: Out of a complete 14 system review of symptoms, the patient complains only of the following symptoms, none and all other reviewed systems are negative.  ALLERGIES: Allergies  Allergen Reactions  . Aspirin Nausea And Vomiting    HOME MEDICATIONS: Outpatient Medications Prior to Visit  Medication Sig Dispense Refill  .  b complex vitamins capsule Take 1 capsule by mouth daily. 100 capsule 2  . Blood Glucose Monitoring Suppl (ONETOUCH VERIO) w/Device KIT Use to check blood sugar up to 4 times a day 1 kit 1  . calcium-vitamin D (OSCAL WITH D) 500-200 MG-UNIT tablet Take 1 tablet by mouth 2 (two) times daily.    . cetaphil (CETAPHIL) lotion Apply 1 application topically as needed for dry skin. 236 mL 0  . donepezil (ARICEPT) 10 MG tablet Take 1 tablet (10 mg total) by mouth at bedtime. 90 tablet 4  . Elastic Bandages & Supports (MEDICAL COMPRESSION STOCKINGS) MISC Wear as much as possible while awake to reduce swelling 2 each 0  . FERROCITE 324 MG TABS tablet Take 1 tablet by mouth twice daily  60 tablet 0  . furosemide (LASIX) 20 MG tablet Take 1 tablet (20 mg total) by mouth daily. 90 tablet 3  . glucose blood (ONETOUCH VERIO) test strip Use as instructed 4 times daily. E11.29, insulin requiring 350 each 3  . insulin lispro (HUMALOG KWIKPEN) 100 UNIT/ML KwikPen Inject 3 units under the skin three times daily before each meal. May increase up to 6 units before meals if needed. 15 mL 1  . Insulin Pen Needle (B-D UF III MINI PEN NEEDLES) 31G X 5 MM MISC USE TO INJECT INSULIN  3 TIMES DAILY 100 each 0  . lactulose (CHRONULAC) 10 GM/15ML solution TAKE 15 ML BY MOUTH TWO TIMES DAILY 473 mL 2  . levETIRAcetam (KEPPRA) 500 MG tablet Take 1 tablet (500 mg total) by mouth 2 (two) times daily. 180 tablet 4  . memantine (NAMENDA) 10 MG tablet Take 1 tablet (10 mg total) by mouth 2 (two) times daily. 180 tablet 4  . ONETOUCH DELICA LANCETS FINE MISC Check blood sugar up to 4 times a day 300 each 4  . oxycodone-acetaminophen (PERCOCET) 2.5-325 MG tablet Take 1 tablet by mouth at bedtime as needed for pain. 30 tablet 0  . oxymetazoline (AFRIN) 0.05 % nasal spray Place 1 spray into both nostrils 2 (two) times daily as needed (Epistaxis). 30 mL 0  . pantoprazole (PROTONIX) 40 MG tablet Take 1 tablet by mouth once daily 180 tablet 0  . sodium chloride (OCEAN) 0.65 % SOLN nasal spray Place 1 spray into both nostrils as needed for congestion. 60 mL 0  . TRESIBA FLEXTOUCH 100 UNIT/ML SOPN FlexTouch Pen INJECT 21 UNITS SUBCUTANEOUSLY IN THE EVENING 15 mL 0  . triamcinolone ointment (KENALOG) 0.5 % APPLY 1 APPLICATION TOPICALLY TO RASH TWICE DAILY FOR ITCHING (Patient taking differently: Apply 1 application topically 2 (two) times daily as needed (itching). ) 15 g 1  . TRULICITY 1.5 GY/6.5LD SOPN INJECT 1.5 MG INTO THE SKIN ONCE A WEEK. EVERY TUESDAY (Patient taking differently: Inject 1.5 mg as directed every Tuesday. ) 8 mL 0   No facility-administered medications prior to visit.     PAST MEDICAL HISTORY:  Past Medical History:  Diagnosis Date  . Chronic anemia   . Chronic diastolic CHF (congestive heart failure) ([place]) 10/03/2013  . Chronic GI bleeding    [name] 11/29/2014  . Family history of anesthesia complication    "niece has a hard time coming out" (09/15/2012)  . Frequent nosebleeds    chronic  . Gastric AV malformation    [name] 11/29/2014  . GERD (gastroesophageal reflux disease)   . 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 History of blood transfusion "several"  . History of  epistaxis   . HTN (hypertension), benign 03/02/2012  . Hyperlipidemia   . Iron deficiency anemia    chronic infusions"  . Lichen planus    Both lower extremities  . 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 Overgrown toenails 03/18/2017  . Pneumonia 1990's X 2  . Pulmonary HTN ([place]) 04/01/2017   PASP 61mHg on echo 09/2016 and 469mg by echo 2019  . Seizures ([place]  . Symptomatic anemia 11/29/2014  . Telangiectasia    Gastric   . 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    PAST SURGICAL HISTORY: Past Surgical History:  Procedure Laterality Date  . CATARACT EXTRACTION     "I think it was just one eye"  . ESOPHAGOGASTRODUODENOSCOPY  02/26/2011   Procedure: ESOPHAGOGASTRODUODENOSCOPY (EGD);  Surgeon: [name];  [facility];  Service: Endoscopy;  Laterality: N/A;  . ESOPHAGOGASTRODUODENOSCOPY N/A 11/08/2012   Procedure: ESOPHAGOGASTRODUODENOSCOPY (EGD);  Surgeon: [name];  [facility];  Service: Endoscopy;  Laterality: N/A;  . ESOPHAGOGASTRODUODENOSCOPY N/A 10/04/2013   Procedure: ESOPHAGOGASTRODUODENOSCOPY (EGD);  Surgeon: [name];  [facility];  Service: Endoscopy;  Laterality: N/A;  with APC on stand-by  . ESOPHAGOGASTRODUODENOSCOPY N/A 07/06/2014   Procedure: ESOPHAGOGASTRODUODENOSCOPY (EGD);  Surgeon: [name];  [facility];  Service: Endoscopy;  Laterality: N/A;   . ESOPHAGOGASTRODUODENOSCOPY N/A 09/05/2014   Procedure: ESOPHAGOGASTRODUODENOSCOPY (EGD);  Surgeon: JaLaurence SpatesMD;  [facility];  Service: Endoscopy;  Laterality: N/A;  APC on standby to control bleeding  . ESOPHAGOGASTRODUODENOSCOPY N/A 11/29/2014   Procedure: ESOPHAGOGASTRODUODENOSCOPY (EGD);  Surgeon: [name];  [facility];  Service: Endoscopy;  Laterality: N/A;  . ESOPHAGOGASTRODUODENOSCOPY N/A 09/28/2015   Procedure: ESOPHAGOGASTRODUODENOSCOPY (EGD);  Surgeon: [name];  [facility];  Service: Endoscopy;  Laterality: N/A;  . ESOPHAGOGASTRODUODENOSCOPY (EGD) WITH PROPOFOL N/A 12/04/2017   Procedure: ESOPHAGOGASTRODUODENOSCOPY (EGD) WITH PROPOFOL;  Surgeon: [name];  [facility] Service: Endoscopy;  Laterality: N/A;  . ESOPHAGOGASTRODUODENOSCOPY ENDOSCOPY  08/19/2006   with laser treatment  . HOT HEMOSTASIS N/A 07/06/2014   Procedure: HOT HEMOSTASIS (ARGON PLASMA COAGULATION/BICAP);  Surgeon: [name];  [facility];  Service: Endoscopy;  Laterality: N/A;  . HOT HEMOSTASIS N/A 09/28/2015   Procedure: HOT HEMOSTASIS (ARGON PLASMA COAGULATION/BICAP);  Surgeon: [name];  [facility];  Service: Endoscopy;  Laterality: N/A;  . HOT HEMOSTASIS N/A 12/04/2017   Procedure: HOT HEMOSTASIS (ARGON PLASMA COAGULATION/BICAP);  Surgeon: [name];  [facility]: Endoscopy;  Laterality: N/A;  . NASAL HEMORRHAGE CONTROL     "for bleeding"   . SAVORY DILATION  02/26/2011   Procedure: SAVORY DILATION;  Surgeon: [name];  [name];  Service: Endoscopy;  Laterality: N/A;  c-arm needed  . SUBMUCOSAL INJECTION  12/04/2017   Procedure: SUBMUCOSAL INJECTION;  Surgeon: [name];  [facility];  Service: Endoscopy;;    FAMILY HISTORY: Family History  Problem Relation Age of Onset  . Stroke Father   . Healthy Mother   .  Breast cancer Other   . Diabetes Niece   . Malignant hyperthermia Neg Hx   . Seizures Neg Hx     SOCIAL HISTORY: Social History   Socioeconomic History  . Marital status: Married    Spouse name: JoJenny Reichmann. Number of children: 0  . Years of education: 9  . Highest education  level: Not on file  Occupational History  . Occupation: Retired Systems developer: RETIRED  Social Needs  . Financial resource strain: Not on file  . Food insecurity    Worry: Not on file    Inability: Not on file  . Transportation needs    Medical: Not on file    Non-medical: Not on file  Tobacco Use  . Smoking status: Former Smoker    Packs/day: 1.00    Years: 20.00    [name] years: 20.00    Types: Cigarettes    Quit date: 02/10/1971    Years since quitting: 47.7  . Smokeless tobacco: Never Used  . Tobacco comment: 09/15/2012 "smoked 50-60 yr ago"  Substance and Sexual Activity  . Alcohol use: No    Alcohol/week: 0.0 standard drinks  . Drug use: No  . Sexual activity: Not on file  Lifestyle  . Physical activity    Days per week: Not on file    Minutes per session: Not on file  . Stress: Not on file  Relationships  . Social Herbalist on phone: Not on file    Gets together: Not on file    Attends religious service: Not on file    Active member of club or organization: Not on file    Attends meetings of clubs or organizations: Not on file    Relationship status: Not on file  . Intimate partner violence    Fear of current or ex partner: Not on file    Emotionally abused: Not on file    Physically abused: Not on file    Forced sexual activity: Not on file  Other Topics Concern  . Not on file  Social History Narrative   Married, lives with husband, [name].  Ambulates without assistance.     Caffeine use: none      PHYSICAL EXAM  Vitals:   11/14/18 1300  BP: (!) 149/63  Pulse: (!) 47  Temp: 97.8 F (36.6 C)  TempSrc: Oral  Weight: 149 lb 3.2 oz (67.7 kg)  Height: 5'  3" (1.6 m)   Body mass index is 26.43 kg/m.  Generalized: Well developed, in no acute distress  Cardiology: normal rate and rhythm, no murmur noted Neurological examination  Mentation: Alert oriented to time with exception of month, place, most history taking. Follows all commands speech and language fluent Cranial nerve II-XII: Pupils were equal round reactive to light. Extraocular movements were full, visual field were full on confrontational test. Facial sensation and strength were normal. Uvula tongue midline. Head turning and shoulder shrug  were normal and symmetric. Motor: The motor testing reveals  over 5 strength of all 4 extremities. Good symmetric motor tone is noted throughout.  Sensory: Sensory testing is intact to soft touch on all 4 extremities. No evidence of extinction is noted.  Coordination: Cerebellar testing reveals slow finger-nose-finger and heel-to-shin bilaterally.  Gait and station: Gait is slow and guarded, uses Rolator   DIAGNOSTIC DATA (LABS, IMAGING, TESTING) - I reviewed patient records, labs, notes, testing and imaging myself where available.  MMSE - Mini Mental State Exam 11/14/2018 11/10/2017 11/10/2016  Not completed: (No Data) - -  Orientation to time '3 5 4  '$ Orientation to Place '2 5 3  '$ Registration '3 3 3  '$ Attention/ Calculation 0 0 0  Recall '2 2 2  '$ Language- name 2 objects '2 2 2  '$ Language- repeat '1 1 1  '$ Language- follow 3 step command 2  3 3  Language- follow 3 step command-comments she held the paper in the air - -  Language- read & follow direction '1 1 1  '$ Write a sentence '1 1 1  '$ Copy design 0 0 0  Total score '17 23 20     '$ Lab Results  Component Value Date   WBC 5.3 11/07/2018   HGB 12.9 11/07/2018   HCT 38.2 11/07/2018   MCV 90.3 11/07/2018   PLT 141 (L) 11/07/2018      Component Value Date/Time   NA 142 10/24/2018 1710   NA 144 04/04/2014 1019   K 5.4 (H) 10/24/2018 1710   K 4.5 04/04/2014 1019   CL 107 (H) 10/24/2018 1710   CL 111  (H) 12/28/2011 0825   CO2 18 (L) 10/24/2018 1710   CO2 26 04/04/2014 1019   GLUCOSE 164 (H) 10/24/2018 1710   GLUCOSE 64 (L) 10/18/2018 0300   GLUCOSE 156 (H) 04/04/2014 1019   GLUCOSE 152 (H) 12/28/2011 0825   BUN 25 10/24/2018 1710   BUN 16.9 04/04/2014 1019   CREATININE 1.20 (H) 10/24/2018 1710   CREATININE 1.62 (H) 07/18/2018 0949   CREATININE 0.92 01/28/2015 0811   CREATININE 0.9 04/04/2014 1019   CALCIUM 9.6 10/24/2018 1710   CALCIUM 9.3 04/04/2014 1019   PROT 7.1 10/18/2018 0042   PROT 5.7 (L) 10/04/2017 1120   PROT 6.4 04/04/2014 1019   ALBUMIN 3.9 10/18/2018 0042   ALBUMIN 3.5 10/04/2017 1120   ALBUMIN 3.3 (L) 04/04/2014 1019   AST 48 (H) 10/18/2018 0042   AST 37 07/18/2018 0949   AST 30 04/04/2014 1019   ALT 36 10/18/2018 0042   ALT 32 07/18/2018 0949   ALT 25 04/04/2014 1019   ALKPHOS 115 10/18/2018 0042   ALKPHOS 140 04/04/2014 1019   BILITOT 0.6 10/18/2018 0042   BILITOT 0.4 07/18/2018 0949   BILITOT 0.43 04/04/2014 1019   GFRNONAA 43 (L) 10/24/2018 1710   GFRNONAA 30 (L) 07/18/2018 0949   GFRAA 50 (L) 10/24/2018 1710   GFRAA 35 (L) 07/18/2018 0949   Lab Results  Component Value Date   CHOL 141 11/29/2014   HDL 31 (L) 11/29/2014   LDLCALC 82 11/29/2014   TRIG 141 11/29/2014   CHOLHDL 4.5 11/29/2014   Lab Results  Component Value Date   HGBA1C 8.7 (A) 08/08/2018   Lab Results  Component Value Date   VITAMINB12 1,615 (H) 10/24/2018   Lab Results  Component Value Date   TSH 1.730 01/30/2015     ASSESSMENT AND PLAN 78 y.o. 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 here with     ICD-10-CM   1. Seizures ([place])  R56.9   2. Memory loss  R41.3     Mrs. Cina continues to do well from a seizure perspective.  We will continue levetiracetam '500mg'$  twice daily.  Memory score is somewhat lower today at 17 of 30.  Last scored 23 of 30 in 11/2017.  She and her husband both feel that she is fairly stable at this point.  She will continue Aricept 10 mg at bedtime and Namenda 10 mg twice daily.  She will continue close follow-up with primary care, cardiology and hematology as directed.  She will follow-up with me in 1 year, sooner if needed.  She and her husband both verbalized understanding and agreement with this plan.   No orders of the defined types were placed in this encounter.    No orders of the defined types were placed in this encounter.       Debbora Presto, FNP-C 11/14/2018, 1:35 PM [facility] [address] [place], [place] [address] [phone]

## 2018-11-14 NOTE — Progress Notes (Signed)
Made any corrections needed, and agree with history, physical, neuro exam,assessment and plan as stated.     Brixon Zhen, MD [facility]  

## 2018-11-14 NOTE — Telephone Encounter (Signed)
Pt was seen today and was given a 1 year follow-up to schedule. At check-out patient stated she will call to schedule this.

## 2018-11-16 ENCOUNTER — Encounter (HOSPITAL_BASED_OUTPATIENT_CLINIC_OR_DEPARTMENT_OTHER): Payer: Medicare Other | Admitting: Cardiology

## 2018-11-16 ENCOUNTER — Other Ambulatory Visit: Payer: Self-pay | Admitting: Internal Medicine

## 2018-11-16 DIAGNOSIS — E1165 Type 2 diabetes mellitus with hyperglycemia: Secondary | ICD-10-CM

## 2018-11-21 ENCOUNTER — Other Ambulatory Visit: Payer: Medicare Other

## 2018-11-21 ENCOUNTER — Ambulatory Visit: Payer: Medicare Other

## 2018-11-21 ENCOUNTER — Encounter: Payer: Medicare Other | Admitting: Internal Medicine

## 2018-11-24 ENCOUNTER — Encounter: Payer: Medicare Other | Attending: Endocrinology | Admitting: Dietician

## 2018-11-24 ENCOUNTER — Other Ambulatory Visit: Payer: Self-pay

## 2018-11-24 ENCOUNTER — Encounter: Payer: Self-pay | Admitting: Dietician

## 2018-11-24 DIAGNOSIS — E1165 Type 2 diabetes mellitus with hyperglycemia: Secondary | ICD-10-CM | POA: Insufficient documentation

## 2018-11-24 DIAGNOSIS — E1129 Type 2 diabetes mellitus with other diabetic kidney complication: Secondary | ICD-10-CM | POA: Insufficient documentation

## 2018-11-24 DIAGNOSIS — IMO0002 Reserved for concepts with insufficient information to code with codable children: Secondary | ICD-10-CM

## 2018-11-24 NOTE — Patient Instructions (Addendum)
Be sure the Humalog before your meals. Take 20 units of Tresiba q am per Dr. Dwyane Dee. Consider marking your pens with the units to avoid mixing them up. Call Dr. Ronnie Derby office if you are having more problems with lows. Treat a low with 1/2 cup regular soda, juice, or 3-4 glucose tabs.  Aim to have a consistent meal schedule. Do not skip meals. Have a small snack mid afternoon if you are hungry. Have a small portion of protein with each meal and snack. Have a boiled egg or small piece of cheese with breakfast  Be consistent about checking your blood sugar.  Check your blood sugar 2 hours after you start a meal more frequently.

## 2018-11-24 NOTE — Progress Notes (Signed)
Medical Nutrition Therapy:  Appt start time: 1330 end time:  1430. Patient was late.  Assessment:  Primary concerns today: Patient is here today with her husband.   History includes type 2 diabetes, stage 3 CKD, hyperkalemia (potassium supplement stopped and husband reports that labs returned to normal), HTN, HLD, pulmonary HTN, GERD, PVD.  Weight was not done today but recent weight146 lbs.  She has inconsistent meal times. She is taking the Humalog after her meals rather than before. She continues to have significant hypoglycemia (4 times in the past week) in the 40's and 50's.  After one low BG incident recently her BG jumped to 316 and stayed high all night.  Most lows are in the afternoon around 12-1:30.  She sleeps late and eats late.    A1C has increased from 5.9% 07/2017 to 8.7% 08/08/18.  They were unable to related any changes over the past year that could be a cause.    Medications include Trulicity, Tresiba 21 units q am, Humalog 5 units (taking after meals).  Patient lives with her husband.  He does the shopping.  They get take out often or TV dinners or other simple foods.  Patient does a very small amount of cooking.  She walks with a cane and does not have enough strength or stamina to cook.  Preferred Learning Style:   No preference indicated   Learning Readiness:   Ready  Change in progress  DIETARY INTAKE:  Irregular meal timing.  Difficult to get clear diet hx.  States that she does not eat much.  24-hr recall:  B (variable depending when she wakes): honey nut cheerios or cornflakes with equal, whole milk  Snk ( AM): boiled egg, ranch dressing  L ( PM): black eyed peas, green beans, chicken wings or vegetable plate OR bologna and cheese sandwich  Snk ( PM): bologna and cheese sandwich and boiled egg D ( PM): Salad from [employer], black eyed peas, beef and rice Snk ( PM): NABS Beverages: whole milk, beet juice,regular lemonade, 1/2 diet Pepsi  Usual physical  activity: walks slowly with a cane her and walks often in her home with a walker  Progress Towards Goal(s):  Resolved.   Nutritional Diagnosis:  NB-1.1 Food and nutrition-related knowledge deficit As related to balance of carbohydrates, protein, and fat.  As evidenced by diet hx and patient report.    Intervention:  Nutrition education related to balanced meals.  Balance of protein with meals and need for improved consistency of meals.  Reviewed her insulin recommendations from Dr. Dwyane Dee and educated why the Humalog needed to be taken prior to the meals (to avoid lows).  Also educated on symptoms and treatment of low BG.    Be sure the Humalog before your meals. Take 20 units of Tresiba q am per Dr. Dwyane Dee. Consider marking your pens with the units to avoid mixing them up. Call Dr. Ronnie Derby office if you are having more problems with lows. Treat a low with 1/2 cup regular soda, juice, or 3-4 glucose tabs.  Aim to have a consistent meal schedule. Do not skip meals. Have a small snack mid afternoon if you are hungry. Have a small portion of protein with each meal and snack. Have a boiled egg or small piece of cheese with breakfast  Be consistent about checking your blood sugar.  Check your blood sugar 2 hours after you start a meal more frequently.  Teaching Method Utilized: diet hx and patient report Visual Auditory Hands  on  Handouts given during visit include:  My plate  Eating out tips  Meal plan card  Sample meal plans from [facility] and AND (1500 calorie)  Hypoglycemia  Barriers to learning/adherence to lifestyle change: memory, debilitated state  Demonstrated degree of understanding via:  Teach Back   Monitoring/Evaluation:  Dietary intake, exercise, label reading, and body weight in 1 month(s).

## 2018-11-28 ENCOUNTER — Encounter: Payer: Self-pay | Admitting: Internal Medicine

## 2018-11-28 ENCOUNTER — Other Ambulatory Visit: Payer: Self-pay

## 2018-11-28 ENCOUNTER — Ambulatory Visit (INDEPENDENT_AMBULATORY_CARE_PROVIDER_SITE_OTHER): Payer: Medicare Other | Admitting: Internal Medicine

## 2018-11-28 DIAGNOSIS — Z79891 Long term (current) use of opiate analgesic: Secondary | ICD-10-CM

## 2018-11-28 DIAGNOSIS — I780 Hereditary hemorrhagic telangiectasia: Secondary | ICD-10-CM | POA: Diagnosis not present

## 2018-11-28 DIAGNOSIS — Z79899 Other long term (current) drug therapy: Secondary | ICD-10-CM

## 2018-11-28 DIAGNOSIS — I10 Essential (primary) hypertension: Secondary | ICD-10-CM

## 2018-11-28 DIAGNOSIS — I739 Peripheral vascular disease, unspecified: Secondary | ICD-10-CM

## 2018-11-28 MED ORDER — OXYCODONE-ACETAMINOPHEN 2.5-325 MG PO TABS: 1.0000 | ORAL_TABLET | Freq: Every evening | ORAL | 0 refills | Status: DC | PRN

## 2018-11-28 NOTE — Patient Instructions (Signed)
[name], It was a pleasure seeing you!  Today we discussed your sleep. I have sent in a refill for your pain medication in case that is playing a role in why you are having a difficult time. If you still cannot sleep, you can try Melatonin which can be purchased over-the-counter at any drug or grocery store.   I'll see back in 3-4 months, but please call sooner if you have any concerns.  Take care, Dr. Koleen Distance

## 2018-12-01 NOTE — Progress Notes (Signed)
   CC: HTN, PVD   HPI:  [name] is a 78 y.o. female with PMHx listed below who presents for follow-up on HTN and PVD. Please see problem based charting for further details.   Past Medical History:  Diagnosis Date  . Chronic anemia   . Chronic diastolic CHF (congestive heart failure) ([place]) 10/03/2013  . Chronic GI bleeding    [name] 11/29/2014  . Family history of anesthesia complication    "niece has a hard time coming out" (09/15/2012)  . Frequent nosebleeds    chronic  . Gastric AV malformation    [name] 11/29/2014  . GERD (gastroesophageal reflux disease)   . 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 History of blood transfusion "several"  . History of epistaxis   . HTN (hypertension), benign 03/02/2012  . Hyperlipidemia   . Iron deficiency anemia    chronic infusions"  . Lichen planus    Both lower extremities  . 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 Overgrown toenails 03/18/2017  . Pneumonia 1990's X 2  . Pulmonary HTN ([place]) 04/01/2017   PASP 71mmHg on echo 09/2016 and 86mmHg by echo 2019  . Seizures ([place]) 09/2014  . Symptomatic anemia 11/29/2014  . Telangiectasia    Gastric   . 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   Review of Systems:  Review of Systems  Constitutional: Negative for chills, fever and weight loss.  HENT: Negative for sore throat.   Respiratory: Negative for cough and shortness of breath.   Cardiovascular: Negative for chest pain and palpitations.  Gastrointestinal: Negative for abdominal pain, nausea and vomiting.  Genitourinary: Negative for dysuria.  Musculoskeletal: Negative for falls.  Neurological: Negative for focal weakness and headaches.    Physical Exam:  Vitals:   11/28/18 1514  BP: (!) 137/43  Pulse: (!) 52  Temp: 98.1 F (36.7 C)  TempSrc: Oral  SpO2: 96%  Weight: 148 lb 4.8 oz (67.3 kg)  Height: 5\' 3"  (1.6 m)   Physical Exam Constitutional:      General: She is  not in acute distress.    Appearance: Normal appearance.  Eyes:     Conjunctiva/sclera: Conjunctivae normal.  Neck:     Musculoskeletal: Neck supple.  Cardiovascular:     Rate and Rhythm: Normal rate and regular rhythm.  Pulmonary:     Effort: Pulmonary effort is normal.     Breath sounds: Normal breath sounds.  Abdominal:     Palpations: Abdomen is soft.     Tenderness: There is no abdominal tenderness.  Musculoskeletal:        General: Swelling present.  Skin:    General: Skin is warm and dry.  Neurological:     General: No focal deficit present.     Mental Status: She is alert and oriented to person, place, and time.  Psychiatric:        Mood and Affect: Mood normal.        Behavior: Behavior normal.     Assessment & Plan:   See Encounters Tab for problem based charting.  Patient discussed with Dr. Rebeca Alert

## 2018-12-02 ENCOUNTER — Encounter: Payer: Self-pay | Admitting: Internal Medicine

## 2018-12-02 NOTE — Assessment & Plan Note (Signed)
Blood pressure stable on current regimen. No symptoms of orthostatic hypotension or headaches, chest pain, palpitations, shortness of breath. Will continue current therapy.

## 2018-12-02 NOTE — Assessment & Plan Note (Signed)
Denies any symptoms of bleeding. Has not required transfusion since starting Avastin in March. Followed closely by Dr. Burr Medico.

## 2018-12-02 NOTE — Assessment & Plan Note (Signed)
Patient continues to struggle with chronic pain from PAD. She is not interested in any procedures. Tries to walk as much as she can. Pain is controlled fairly well on low dose Oxycodone. She denies any over-sedation or falls when taking medication. Will send refill in today.

## 2018-12-05 ENCOUNTER — Other Ambulatory Visit: Payer: Self-pay

## 2018-12-05 ENCOUNTER — Inpatient Hospital Stay: Payer: Medicare Other

## 2018-12-05 ENCOUNTER — Inpatient Hospital Stay: Payer: Medicare Other | Attending: Hematology

## 2018-12-05 VITALS — BP 178/61 | HR 55 | Temp 98.2°F | Resp 17

## 2018-12-05 DIAGNOSIS — D500 Iron deficiency anemia secondary to blood loss (chronic): Secondary | ICD-10-CM

## 2018-12-05 DIAGNOSIS — Z79899 Other long term (current) drug therapy: Secondary | ICD-10-CM | POA: Insufficient documentation

## 2018-12-05 DIAGNOSIS — I780 Hereditary hemorrhagic telangiectasia: Secondary | ICD-10-CM

## 2018-12-05 DIAGNOSIS — Z5112 Encounter for antineoplastic immunotherapy: Secondary | ICD-10-CM | POA: Insufficient documentation

## 2018-12-05 DIAGNOSIS — D508 Other iron deficiency anemias: Secondary | ICD-10-CM | POA: Diagnosis not present

## 2018-12-05 DIAGNOSIS — Z95828 Presence of other vascular implants and grafts: Secondary | ICD-10-CM

## 2018-12-05 DIAGNOSIS — K31819 Angiodysplasia of stomach and duodenum without bleeding: Secondary | ICD-10-CM

## 2018-12-05 LAB — CBC WITH DIFFERENTIAL (CANCER CENTER ONLY)
Abs Immature Granulocytes: 0.01 10*3/uL (ref 0.00–0.07)
Basophils Absolute: 0 10*3/uL (ref 0.0–0.1)
Basophils Relative: 0 %
Eosinophils Absolute: 0.4 10*3/uL (ref 0.0–0.5)
Eosinophils Relative: 7 %
HCT: 42.7 % (ref 36.0–46.0)
Hemoglobin: 14.8 g/dL (ref 12.0–15.0)
Immature Granulocytes: 0 %
Lymphocytes Relative: 10 %
Lymphs Abs: 0.5 10*3/uL — ABNORMAL LOW (ref 0.7–4.0)
MCH: 30.6 pg (ref 26.0–34.0)
MCHC: 34.7 g/dL (ref 30.0–36.0)
MCV: 88.4 fL (ref 80.0–100.0)
Monocytes Absolute: 0.6 10*3/uL (ref 0.1–1.0)
Monocytes Relative: 12 %
Neutro Abs: 3.9 10*3/uL (ref 1.7–7.7)
Neutrophils Relative %: 71 %
Platelet Count: 117 10*3/uL — ABNORMAL LOW (ref 150–400)
RBC: 4.83 MIL/uL (ref 3.87–5.11)
RDW: 12.5 % (ref 11.5–15.5)
WBC Count: 5.5 10*3/uL (ref 4.0–10.5)
nRBC: 0 % (ref 0.0–0.2)

## 2018-12-05 LAB — TOTAL PROTEIN, URINE DIPSTICK: Protein, ur: <30 mg/dL — AB

## 2018-12-05 LAB — FERRITIN: Ferritin: 51 ng/mL (ref 11–307)

## 2018-12-05 MED ORDER — SODIUM CHLORIDE 0.9% FLUSH
10.0000 mL | INTRAVENOUS | Status: DC | PRN
  Administered 2018-12-05: 11:00:00 10 mL
  Filled 2018-12-05: qty 10

## 2018-12-05 MED ORDER — SODIUM CHLORIDE 0.9% FLUSH
10.0000 mL | INTRAVENOUS | Status: DC | PRN
  Administered 2018-12-05: 10 mL
  Filled 2018-12-05: qty 10

## 2018-12-05 MED ORDER — SODIUM CHLORIDE 0.9 % IV SOLN
Freq: Once | INTRAVENOUS | Status: AC
  Administered 2018-12-05: 12:00:00 via INTRAVENOUS
  Filled 2018-12-05: qty 250

## 2018-12-05 MED ORDER — HEPARIN SOD (PORK) LOCK FLUSH 100 UNIT/ML IV SOLN
500.0000 [IU] | Freq: Once | INTRAVENOUS | Status: AC | PRN
  Administered 2018-12-05: 500 [IU]
  Filled 2018-12-05: qty 5

## 2018-12-05 MED ORDER — SODIUM CHLORIDE 0.9 % IV SOLN
4.6000 mg/kg | Freq: Once | INTRAVENOUS | Status: AC
  Administered 2018-12-05: 300 mg via INTRAVENOUS
  Filled 2018-12-05: qty 12

## 2018-12-05 NOTE — Progress Notes (Signed)
Internal Medicine Clinic Attending  Case discussed with Dr. Bloomfield at the time of the visit.  We reviewed the resident's history and exam and pertinent patient test results.  I agree with the assessment, diagnosis, and plan of care documented in the resident's note.  Alexander Raines, M.D., Ph.D.  

## 2018-12-05 NOTE — Patient Instructions (Signed)

## 2018-12-05 NOTE — Patient Instructions (Signed)
[facility] Discharge Instructions for Patients Receiving Chemotherapy  Today you received the following chemotherapy agents Bevacizumab  To help prevent nausea and vomiting after your treatment, we encourage you to take your nausea medication as directed   If you develop nausea and vomiting that is not controlled by your nausea medication, call the clinic.   BELOW ARE SYMPTOMS THAT SHOULD BE REPORTED IMMEDIATELY:  *FEVER GREATER THAN 100.5 F  *CHILLS WITH OR WITHOUT FEVER  NAUSEA AND VOMITING THAT IS NOT CONTROLLED WITH YOUR NAUSEA MEDICATION  *UNUSUAL SHORTNESS OF BREATH  *UNUSUAL BRUISING OR BLEEDING  TENDERNESS IN MOUTH AND THROAT WITH OR WITHOUT PRESENCE OF ULCERS  *URINARY PROBLEMS  *BOWEL PROBLEMS  UNUSUAL RASH Items with * indicate a potential emergency and should be followed up as soon as possible.  Feel free to call the clinic should you have any questions or concerns. The clinic phone number is [phone].  Please show the CHEMO ALERT CARD at check-in to the Emergency Department and triage nurse.   

## 2018-12-07 ENCOUNTER — Other Ambulatory Visit: Payer: Self-pay | Admitting: Internal Medicine

## 2018-12-19 ENCOUNTER — Other Ambulatory Visit: Payer: Medicare Other

## 2018-12-19 ENCOUNTER — Ambulatory Visit: Payer: Medicare Other

## 2018-12-22 ENCOUNTER — Ambulatory Visit: Payer: Medicare Other | Admitting: Dietician

## 2018-12-22 ENCOUNTER — Ambulatory Visit: Payer: Medicare Other | Admitting: Endocrinology

## 2018-12-29 NOTE — Progress Notes (Signed)
[name]   [phone] Fax:([phone]) [phone]   Clinic Follow up Note   Patient Care Team: [name] Bison, DO as PCP - General [name] Margarita, MD as PCP - Cardiology (Cardiology)  Date of Service:  01/02/2019  CHIEF COMPLAINT: F/u ofAnemia due to chronic blood loss, HHT  HEMATOLOGY HISTORY 1.Hereditary hemorrhagic telangiectasia (HHT),with epistaxis andfrequentGI bleeding. Previously under care of [name] 2.Anemia secondary to GI bleeding and iron deficiency, required blood transfusion every 3 to 4 weeks, IV Feraheme every 1 to 2 months  CURRENT THERAPY 1. Iv feraheme as needed if ferritin<50 2.Avastin43m/kg every 2 weeks, starting3/30/20. Reduced to every 4 weeks starting 11/07/18. Reduced to every 2 months starting 01/02/19.  3. Blood transfusion if Hg<8.0  INTERVAL HISTORY:  [place] and anemia and HHT. She presents to the clinic alone. She notes she is doing well. She notes b/l leg issues and she feels she cannot walk much. She is unsure of what is causing this but has been chronic. She notes her energy is low and she feels tired. She and her husband notes her BP is elevated at home, in 160-170 range often.    REVIEW OF SYSTEMS:   Constitutional: Denies fevers, chills or abnormal weight loss (+) fatigue  Eyes: Denies blurriness of vision Ears, nose, mouth, throat, and face: Denies mucositis or sore throat Respiratory: Denies cough, dyspnea or wheezes Cardiovascular: Denies palpitation, chest discomfort or lower extremity swelling Gastrointestinal:  Denies nausea, heartburn or change in bowel habits Skin: Denies abnormal skin rashes Lymphatics: Denies new lymphadenopathy or easy bruising Neurological:Denies numbness, tingling or new weaknesses (+) B/l leg weakness  Behavioral/Psych: Mood is stable, no new changes  All other systems were reviewed with the patient and are negative.  MEDICAL  HISTORY:  Past Medical History:  Diagnosis Date  . Chronic anemia   . Chronic diastolic CHF ([place]estive heart [place]) [place] 10/03[place]  . Chronic GI bleeding    /[name] Endo10/20/2016  . Family history of anesthesia complication    "niece has a hard time coming out" (09/15/2012)  . Frequent nosebleeds    chronic  . Gastric AV malformation    /[name] Endo10/20/2016  . GERD (gastroesophageal reflux disease)   . 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 History of blood transfusion "several"  . History of epistaxis   . HTN (hypertension), benign 03/02/2012  . Hyperlipidemia   . Iron deficiency anemia    chronic infusions"  . Lichen planus    Both lower extremities  . 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[place]    /[name] Endo10/20/2016  . Overgrown toenails 03/18/2017  . Pneumonia 1990's X 2  . Pulmonary HTN ([place] 04/01/2017   PASP 588mg on echo 09/2016 and 4558m by echo 2019  . [place]  . Symptomatic anemia 11/29/2014  . Telangiectasia    Gastric   . 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    SURGICAL HISTORY: Past Surgical History:  Procedure Laterality Date  . CATARACT EXTRACTION     "I think it was just one eye"  . ESOPHAGOGASTRODUODENOSCOPY  02/26/2011   Procedure: ESOPHAGOGASTRODUODENOSCOPY (EGD);  Surgeon: JohMissy SabinsD;  [facility];  Service: Endoscopy;  Laterality: N/A;  . ESOPHAGOGASTRODUODENOSCOPY N/A 11/08/2012   Procedure: ESOPHAGOGASTRODUODENOSCOPY (EGD);  Surgeon: PatBeryle BeamsD;  [facility];  Service: Endoscopy;  Laterality: N/A;  . ESOPHAGOGASTRODUODENOSCOPY N/A 10/04/2013   Procedure: ESOPHAGOGASTRODUODENOSCOPY (  EGD);  Surgeon: Winfield Cunas., MD;  [name];  Service: Endoscopy;  Laterality: N/A;  with APC on stand-by  . ESOPHAGOGASTRODUODENOSCOPY N/A 07/06/2014   Procedure: ESOPHAGOGASTRODUODENOSCOPY (EGD);  Surgeon: Clarene Essex, MD;  [name];  Service: Endoscopy;  Laterality:  N/A;  . ESOPHAGOGASTRODUODENOSCOPY N/A 09/05/2014   Procedure: ESOPHAGOGASTRODUODENOSCOPY (EGD);  Surgeon: Laurence Spates, MD;  [name];  Service: Endoscopy;  Laterality: N/A;  APC on standby to control bleeding  . ESOPHAGOGASTRODUODENOSCOPY N/A 11/29/2014   Procedure: ESOPHAGOGASTRODUODENOSCOPY (EGD);  Surgeon: [name], MD;  [facility]COPY;  Service: Endoscopy;  Laterality: N/A;  . ESOPHAGOGASTRODUODENOSCOPY N/A 09/28/2015   Procedure: ESOPHAGOGASTRODUODENOSCOPY (EGD);  Surgeon: Clarene Essex, MD;  Location: [facility] ENDOSCOPY;  Service: Endoscopy;  Laterality: N/A;  . ESOPHAGOGASTRODUODENOSCOPY (EGD) WITH PROPOFOL N/A 12/04/2017   Procedure: ESOPHAGOGASTRODUODENOSCOPY (EGD) WITH PROPOFOL;  Surgeon: [name], MD;  Location: [place];  Service: Endoscopy;  Laterality: N/A;  . ESOPHAGOGASTRODUODENOSCOPY ENDOSCOPY  08/19/2006   with laser treatment  . HOT HEMOSTASIS N/A 07/06/2014   Procedure: HOT HEMOSTASIS (ARGON PLASMA COAGULATION/BICAP);  Surgeon: Clarene Essex, MD;  [name];  Service: Endoscopy;  Laterality: N/A;  . HOT HEMOSTASIS N/A 09/28/2015   Procedure: HOT HEMOSTASIS (ARGON PLASMA COAGULATION/BICAP);  Surgeon: Clarene Essex, MD;  Location: [facility] ENDOSCOPY;  Service: Endoscopy;  Laterality: N/A;  . HOT HEMOSTASIS N/A 12/04/2017   Procedure: HOT HEMOSTASIS (ARGON PLASMA COAGULATION/BICAP);  Surgeon: [name], MD;  Location: [place];  Service: Endoscopy;  Laterality: N/A;  . NASAL HEMORRHAGE CONTROL     "for bleeding"   . SAVORY DILATION  02/26/2011   Procedure: SAVORY DILATION;  Surgeon: [name], MD;  [name];  Service: Endoscopy;  Laterality: N/A;  c-arm needed  . SUBMUCOSAL INJECTION  12/04/2017   Procedure: SUBMUCOSAL INJECTION;  Surgeon: [name], MD;  [facility];  Service: Endoscopy;;    I have reviewed the social history and family history with the patient and they are unchanged from  previous note.  ALLERGIES:  is allergic to aspirin.  MEDICATIONS:  Current Outpatient Medications  Medication Sig Dispense Refill  . b complex vitamins capsule Take 1 capsule by mouth daily. 100 capsule 2  . Blood Glucose Monitoring Suppl (ONETOUCH VERIO) w/Device KIT Use to check blood sugar up to 4 times a day 1 kit 1  . calcium-vitamin D (OSCAL WITH D) 500-200 MG-UNIT tablet Take 1 tablet by mouth 2 (two) times daily.    . cetaphil (CETAPHIL) lotion Apply 1 application topically as needed for dry skin. 236 mL 0  . donepezil (ARICEPT) 10 MG tablet Take 1 tablet (10 mg total) by mouth at bedtime. 90 tablet 4  . Elastic Bandages & Supports (MEDICAL COMPRESSION STOCKINGS) MISC Wear as much as possible while awake to reduce swelling 2 each 0  . FERROCITE 324 MG TABS tablet Take 1 tablet by mouth twice daily 60 tablet 5  . furosemide (LASIX) 20 MG tablet Take 1 tablet (20 mg total) by mouth daily. 90 tablet 3  . glucose blood (ONETOUCH VERIO) test strip Use as instructed 4 times daily. E11.29, insulin requiring 350 each 3  . insulin lispro (HUMALOG KWIKPEN) 100 UNIT/ML KwikPen Inject 3 units under the skin three times daily before each meal. May increase up to 6 units before meals if needed. 15 mL 1  . Insulin Pen Needle (B-D UF III MINI PEN NEEDLES) 31G X 5 MM MISC USE TO INJECT INSULIN  3 TIMES DAILY 100  each 0  . lactulose (CHRONULAC) 10 GM/15ML solution TAKE 15 ML BY MOUTH TWO TIMES DAILY 473 mL 2  . levETIRAcetam (KEPPRA) 500 MG tablet Take 1 tablet (500 mg total) by mouth 2 (two) times daily. 180 tablet 4  . memantine (NAMENDA) 10 MG tablet Take 1 tablet (10 mg total) by mouth 2 (two) times daily. 180 tablet 4  . ONETOUCH DELICA LANCETS FINE MISC Check blood sugar up to 4 times a day 300 each 4  . oxycodone-acetaminophen (PERCOCET) 2.5-325 MG tablet Take 1 tablet by mouth at bedtime as needed for pain. 30 tablet 0  . oxymetazoline (AFRIN) 0.05 % nasal spray Place 1 spray into both nostrils  2 (two) times daily as needed (Epistaxis). 30 mL 0  . pantoprazole (PROTONIX) 40 MG tablet Take 1 tablet by mouth once daily 180 tablet 0  . sodium chloride (OCEAN) 0.65 % SOLN nasal spray Place 1 spray into both nostrils as needed for congestion. 60 mL 0  . TRESIBA FLEXTOUCH 100 UNIT/ML SOPN FlexTouch Pen INJECT 21 UNITS SUBCUTANEOUSLY IN THE EVENING 15 mL 0  . triamcinolone ointment (KENALOG) 0.5 % APPLY 1 APPLICATION TOPICALLY TO RASH TWICE DAILY FOR ITCHING (Patient taking differently: Apply 1 application topically 2 (two) times daily as needed (itching). ) 15 g 1  . TRULICITY 1.5 PO/2.4MP SOPN INJECT 1.5 MG INTO THE SKIN ONCE A WEEK EVERY TUESDAY 8 mL 0  . amLODipine (NORVASC) 5 MG tablet Take 1 tablet (5 mg total) by mouth daily. 30 tablet 3   No current facility-administered medications for this visit.    Facility-Administered Medications Ordered in Other Visits  Medication Dose Route Frequency Provider Last Rate Last Dose  . bevacizumab-awwb (MVASI) 300 mg in sodium chloride 0.9 % 100 mL chemo infusion  4.6 mg/kg (Treatment Plan Recorded) Intravenous Once Truitt Merle, MD      . heparin lock flush 100 unit/mL  500 Units Intracatheter Once PRN Truitt Merle, MD      . sodium chloride flush (NS) 0.9 % injection 10 mL  10 mL Intracatheter PRN Truitt Merle, MD        PHYSICAL EXAMINATION: ECOG PERFORMANCE STATUS: 3 - Symptomatic, >50% confined to bed  Vitals:   01/02/19 1001  BP: (!) 177/53  Pulse: (!) 58  Resp: 20  Temp: 98.5 F (36.9 C)  SpO2: 99%   Filed Weights   01/02/19 1001  Weight: 151 lb 4.8 oz (68.6 kg)    GENERAL:alert, no distress and comfortable SKIN: skin color, texture, turgor are normal, no rashes or significant lesions EYES: normal, Conjunctiva are pink and non-injected, sclera clear  NECK: supple, thyroid normal size, non-tender, without nodularity LYMPH:  no palpable lymphadenopathy in the cervical, axillary  LUNGS: clear to auscultation and percussion with normal  breathing effort HEART: regular rate & rhythm and no murmurs and no lower extremity edema ABDOMEN:abdomen soft, non-tender and normal bowel sounds Musculoskeletal:no cyanosis of digits and no clubbing  NEURO: alert & oriented x 3 with fluent speech, no focal motor/sensory deficits  LABORATORY DATA:  I have reviewed the data as listed CBC Latest Ref Rng & Units 01/02/2019 12/05/2018 11/07/2018  WBC 4.0 - 10.5 K/uL 4.7 5.5 5.3  Hemoglobin 12.0 - 15.0 g/dL 13.3 14.8 12.9  Hematocrit 36.0 - 46.0 % 39.3 42.7 38.2  Platelets 150 - 400 K/uL 121(L) 117(L) 141(L)     CMP Latest Ref Rng & Units 01/02/2019 10/24/2018 10/18/2018  Glucose 70 - 99 mg/dL 233(H) 164(H) -  BUN  8 - 23 mg/dL 17 25 -  Creatinine 0.44 - 1.00 mg/dL 1.16(H) 1.20(H) 1.37(H)  Sodium 135 - 145 mmol/L 143 142 -  Potassium 3.5 - 5.1 mmol/L 4.5 5.4(H) 4.3  Chloride 98 - 111 mmol/L 107 107(H) -  CO2 22 - 32 mmol/L 27 18(L) -  Calcium 8.9 - 10.3 mg/dL 10.1 9.6 -  Total Protein 6.5 - 8.1 g/dL 6.6 - -  Total Bilirubin 0.3 - 1.2 mg/dL 0.8 - -  Alkaline Phos 38 - 126 U/L 194(H) - -  AST 15 - 41 U/L 101(H) - -  ALT 0 - 44 U/L 96(H) - -      RADIOGRAPHIC STUDIES: I have personally reviewed the radiological images as listed and agreed with the findings in the report. No results found.   ASSESSMENT & PLAN:  [name] is a 78 y.o. female with    1.Hereditary hemorrhagic telangiectasia(HHT)), withepistaxis and frequent GI bleeding -She has mild epistaxis, and frequent GI bleeding, she used to require blood transfusion every 3 to 4 weeks. Her last blood transfusion was on 04/13/2018 -She started Avastin on 05/09/18.Currently receiving once a month. Sheis tolerating well and responding well.She has not required Blood transfusion since she started treatment and anemia resolved.  -Labs reviewed, anemia currently resolved. Urine protein 100 today. Will proceed with Avastin today.  -Given good response, I discussed reducing  Avastin to every 2 months. She is agreeable.  -continue IVFeraheme if ferritin<50and Continue oral iron.  -F/u in2 months   2. 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   3.Iron deficiency anemia secondary to #2  -She does not tolerate oral ironwell but is on it -will continueIV Feraheme if ferritin less than 50. Last IV Feraheme8/3/20.   4.Dementia  -She takes Ariceptand memantine -f/u with neurology. She has not been seen in some time. 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  5. 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 She mainly gets around by wheelchair.  -She previously tookTylox, Percocet and Tramadol. She can obtain refills from her PCP if needed.  -She notes having b/l leg weakness and she feels she cannot walk much.   6. DM,CHFand CKD stage III -She is on insulin, DM not well controlled. She will continue to monitor her blood glucose at home -CKD andCHF stable clinically   7. hypertension, secondary to Avastin -She has not been on HTN medication before.  -Per pt and her husband SBP is elevated in 150-170 range at home.  -I will call in 59m amlodipine today (01/02/19). She is agreeable.   Plan -I called in amlodipine 523mtoday  -Labs reviewed and adequate to proceed with Avastin today  -Ferritin was 51 one month ago, will give one dose iv feraheme today  -Continue oral iron daily  -Lab, flush, f/u and Avastin in 2 months, Feraheme if ferritin<50   No problem-specific Assessment & Plan notes found for this encounter.   No orders of the defined types  were placed in this encounter.  All questions were answered. The patient knows to call the clinic with any problems, questions or concerns. No barriers to learning  was detected.     Truitt Merle, MD 01/02/2019   I, Joslyn Devon, am acting as scribe for Truitt Merle, MD.   I have reviewed the above documentation for accuracy and completeness, and I agree with the above.

## 2018-12-30 ENCOUNTER — Ambulatory Visit (HOSPITAL_BASED_OUTPATIENT_CLINIC_OR_DEPARTMENT_OTHER): Payer: Medicare Other | Attending: Cardiology | Admitting: Cardiology

## 2019-01-02 ENCOUNTER — Telehealth: Payer: Self-pay | Admitting: Hematology

## 2019-01-02 ENCOUNTER — Encounter: Payer: Self-pay | Admitting: Hematology

## 2019-01-02 ENCOUNTER — Inpatient Hospital Stay: Payer: Medicare Other | Attending: Hematology

## 2019-01-02 ENCOUNTER — Inpatient Hospital Stay: Payer: Medicare Other

## 2019-01-02 ENCOUNTER — Inpatient Hospital Stay (HOSPITAL_BASED_OUTPATIENT_CLINIC_OR_DEPARTMENT_OTHER): Payer: Medicare Other | Admitting: Hematology

## 2019-01-02 ENCOUNTER — Other Ambulatory Visit: Payer: Self-pay

## 2019-01-02 VITALS — BP 177/53 | HR 58 | Temp 98.5°F | Resp 20 | Ht 63.0 in | Wt 151.3 lb

## 2019-01-02 VITALS — BP 153/48 | HR 53 | Temp 98.4°F | Resp 18

## 2019-01-02 DIAGNOSIS — D500 Iron deficiency anemia secondary to blood loss (chronic): Secondary | ICD-10-CM | POA: Insufficient documentation

## 2019-01-02 DIAGNOSIS — K219 Gastro-esophageal reflux disease without esophagitis: Secondary | ICD-10-CM | POA: Diagnosis not present

## 2019-01-02 DIAGNOSIS — E785 Hyperlipidemia, unspecified: Secondary | ICD-10-CM | POA: Diagnosis not present

## 2019-01-02 DIAGNOSIS — I5032 Chronic diastolic (congestive) heart failure: Secondary | ICD-10-CM | POA: Insufficient documentation

## 2019-01-02 DIAGNOSIS — Z79899 Other long term (current) drug therapy: Secondary | ICD-10-CM | POA: Insufficient documentation

## 2019-01-02 DIAGNOSIS — I272 Pulmonary hypertension, unspecified: Secondary | ICD-10-CM | POA: Insufficient documentation

## 2019-01-02 DIAGNOSIS — Z5112 Encounter for antineoplastic immunotherapy: Secondary | ICD-10-CM | POA: Insufficient documentation

## 2019-01-02 DIAGNOSIS — K31819 Angiodysplasia of stomach and duodenum without bleeding: Secondary | ICD-10-CM

## 2019-01-02 DIAGNOSIS — N183 Chronic kidney disease, stage 3 unspecified: Secondary | ICD-10-CM | POA: Diagnosis not present

## 2019-01-02 DIAGNOSIS — M79604 Pain in right leg: Secondary | ICD-10-CM | POA: Insufficient documentation

## 2019-01-02 DIAGNOSIS — Z8719 Personal history of other diseases of the digestive system: Secondary | ICD-10-CM | POA: Insufficient documentation

## 2019-01-02 DIAGNOSIS — I780 Hereditary hemorrhagic telangiectasia: Secondary | ICD-10-CM

## 2019-01-02 DIAGNOSIS — M542 Cervicalgia: Secondary | ICD-10-CM | POA: Diagnosis not present

## 2019-01-02 DIAGNOSIS — F0390 Unspecified dementia without behavioral disturbance: Secondary | ICD-10-CM | POA: Diagnosis not present

## 2019-01-02 DIAGNOSIS — Z794 Long term (current) use of insulin: Secondary | ICD-10-CM | POA: Insufficient documentation

## 2019-01-02 DIAGNOSIS — K31811 Angiodysplasia of stomach and duodenum with bleeding: Secondary | ICD-10-CM

## 2019-01-02 DIAGNOSIS — R531 Weakness: Secondary | ICD-10-CM | POA: Insufficient documentation

## 2019-01-02 DIAGNOSIS — D509 Iron deficiency anemia, unspecified: Secondary | ICD-10-CM

## 2019-01-02 DIAGNOSIS — E1122 Type 2 diabetes mellitus with diabetic chronic kidney disease: Secondary | ICD-10-CM | POA: Insufficient documentation

## 2019-01-02 DIAGNOSIS — G40909 Epilepsy, unspecified, not intractable, without status epilepticus: Secondary | ICD-10-CM | POA: Diagnosis not present

## 2019-01-02 DIAGNOSIS — I130 Hypertensive heart and chronic kidney disease with heart failure and stage 1 through stage 4 chronic kidney disease, or unspecified chronic kidney disease: Secondary | ICD-10-CM | POA: Insufficient documentation

## 2019-01-02 DIAGNOSIS — Z95828 Presence of other vascular implants and grafts: Secondary | ICD-10-CM

## 2019-01-02 LAB — CBC WITH DIFFERENTIAL (CANCER CENTER ONLY)
Abs Immature Granulocytes: 0.02 10*3/uL (ref 0.00–0.07)
Basophils Absolute: 0 10*3/uL (ref 0.0–0.1)
Basophils Relative: 1 %
Eosinophils Absolute: 0.3 10*3/uL (ref 0.0–0.5)
Eosinophils Relative: 7 %
HCT: 39.3 % (ref 36.0–46.0)
Hemoglobin: 13.3 g/dL (ref 12.0–15.0)
Immature Granulocytes: 0 %
Lymphocytes Relative: 9 %
Lymphs Abs: 0.4 10*3/uL — ABNORMAL LOW (ref 0.7–4.0)
MCH: 30.6 pg (ref 26.0–34.0)
MCHC: 33.8 g/dL (ref 30.0–36.0)
MCV: 90.6 fL (ref 80.0–100.0)
Monocytes Absolute: 0.5 10*3/uL (ref 0.1–1.0)
Monocytes Relative: 11 %
Neutro Abs: 3.4 10*3/uL (ref 1.7–7.7)
Neutrophils Relative %: 72 %
Platelet Count: 121 10*3/uL — ABNORMAL LOW (ref 150–400)
RBC: 4.34 MIL/uL (ref 3.87–5.11)
RDW: 13.2 % (ref 11.5–15.5)
WBC Count: 4.7 10*3/uL (ref 4.0–10.5)
nRBC: 0 % (ref 0.0–0.2)

## 2019-01-02 LAB — CMP (CANCER CENTER ONLY)
ALT: 96 U/L — ABNORMAL HIGH (ref 0–44)
AST: 101 U/L — ABNORMAL HIGH (ref 15–41)
Albumin: 3.2 g/dL — ABNORMAL LOW (ref 3.5–5.0)
Alkaline Phosphatase: 194 U/L — ABNORMAL HIGH (ref 38–126)
Anion gap: 9 (ref 5–15)
BUN: 17 mg/dL (ref 8–23)
CO2: 27 mmol/L (ref 22–32)
Calcium: 10.1 mg/dL (ref 8.9–10.3)
Chloride: 107 mmol/L (ref 98–111)
Creatinine: 1.16 mg/dL — ABNORMAL HIGH (ref 0.44–1.00)
GFR, Est AFR Am: 52 mL/min — ABNORMAL LOW (ref >60)
GFR, Estimated: 45 mL/min — ABNORMAL LOW (ref >60)
Glucose, Bld: 233 mg/dL — ABNORMAL HIGH (ref 70–99)
Potassium: 4.5 mmol/L (ref 3.5–5.1)
Sodium: 143 mmol/L (ref 135–145)
Total Bilirubin: 0.8 mg/dL (ref 0.3–1.2)
Total Protein: 6.6 g/dL (ref 6.5–8.1)

## 2019-01-02 LAB — TOTAL PROTEIN, URINE DIPSTICK: Protein, ur: 100 mg/dL — AB

## 2019-01-02 MED ORDER — SODIUM CHLORIDE 0.9 % IV SOLN
510.0000 mg | Freq: Once | INTRAVENOUS | Status: AC
  Administered 2019-01-02: 510 mg via INTRAVENOUS
  Filled 2019-01-02: qty 510

## 2019-01-02 MED ORDER — SODIUM CHLORIDE 0.9% FLUSH
10.0000 mL | INTRAVENOUS | Status: DC | PRN
  Administered 2019-01-02: 10 mL
  Filled 2019-01-02: qty 10

## 2019-01-02 MED ORDER — SODIUM CHLORIDE 0.9 % IV SOLN
Freq: Once | INTRAVENOUS | Status: DC
  Filled 2019-01-02: qty 250

## 2019-01-02 MED ORDER — SODIUM CHLORIDE 0.9 % IV SOLN
4.6000 mg/kg | Freq: Once | INTRAVENOUS | Status: AC
  Administered 2019-01-02: 300 mg via INTRAVENOUS
  Filled 2019-01-02: qty 12

## 2019-01-02 MED ORDER — AMLODIPINE BESYLATE 5 MG PO TABS: 5.0000 mg | ORAL_TABLET | Freq: Every day | ORAL | 3 refills | Status: DC

## 2019-01-02 MED ORDER — HEPARIN SOD (PORK) LOCK FLUSH 100 UNIT/ML IV SOLN
500.0000 [IU] | Freq: Once | INTRAVENOUS | Status: AC | PRN
  Administered 2019-01-02: 500 [IU]
  Filled 2019-01-02: qty 5

## 2019-01-02 MED ORDER — SODIUM CHLORIDE 0.9 % IV SOLN
Freq: Once | INTRAVENOUS | Status: AC
  Administered 2019-01-02: 11:00:00 via INTRAVENOUS
  Filled 2019-01-02: qty 250

## 2019-01-02 NOTE — Progress Notes (Signed)
Per Dr Burr Medico ok to proceed with bevacizumab with urine protein of 100

## 2019-01-02 NOTE — Progress Notes (Signed)
Per Dr. Burr Medico patient is to also receive Feraheme today - PA is still approved for feraheme.   Leron Croak, PharmD PGY2 Hematology/Oncology Pharmacy Resident 01/02/2019 11:16 AM

## 2019-01-02 NOTE — Patient Instructions (Signed)
Bevacizumab injection What is this medicine? BEVACIZUMAB (be va SIZ yoo mab) is a monoclonal antibody. It is used to treat many types of cancer. This medicine may be used for other purposes; ask your health care provider or pharmacist if you have questions. COMMON BRAND NAME(S): Avastin, MVASI, Zirabev What should I tell my health care provider before I take this medicine? They need to know if you have any of these conditions:  diabetes  heart disease  high blood pressure  history of coughing up blood  prior anthracycline chemotherapy (e.g., doxorubicin, daunorubicin, epirubicin)  recent or ongoing radiation therapy  recent or planning to have surgery  stroke  an unusual or allergic reaction to bevacizumab, hamster proteins, mouse proteins, other medicines, foods, dyes, or preservatives  pregnant or trying to get pregnant  breast-feeding How should I use this medicine? This medicine is for infusion into a vein. It is given by a health care professional in a hospital or clinic setting. Talk to your pediatrician regarding the use of this medicine in children. Special care may be needed. Overdosage: If you think you have taken too much of this medicine contact a poison control center or emergency room at once. NOTE: This medicine is only for you. Do not share this medicine with others. What if I miss a dose? It is important not to miss your dose. Call your doctor or health care professional if you are unable to keep an appointment. What may interact with this medicine? Interactions are not expected. This list may not describe all possible interactions. Give your health care provider a list of all the medicines, herbs, non-prescription drugs, or dietary supplements you use. Also tell them if you smoke, drink alcohol, or use illegal drugs. Some items may interact with your medicine. What should I watch for while using this medicine? Your condition will be monitored carefully while  you are receiving this medicine. You will need important blood work and urine testing done while you are taking this medicine. This medicine may increase your risk to bruise or bleed. Call your doctor or health care professional if you notice any unusual bleeding. This medicine should be started at least 28 days following major surgery and the site of the surgery should be totally healed. Check with your doctor before scheduling dental work or surgery while you are receiving this treatment. Talk to your doctor if you have recently had surgery or if you have a wound that has not healed. Do not become pregnant while taking this medicine or for 6 months after stopping it. Women should inform their doctor if they wish to become pregnant or think they might be pregnant. There is a potential for serious side effects to an unborn child. Talk to your health care professional or pharmacist for more information. Do not breast-feed an infant while taking this medicine and for 6 months after the last dose. This medicine has caused ovarian failure in some women. This medicine may interfere with the ability to have a child. You should talk to your doctor or health care professional if you are concerned about your fertility. What side effects may I notice from receiving this medicine? Side effects that you should report to your doctor or health care professional as soon as possible:  allergic reactions like skin rash, itching or hives, swelling of the face, lips, or tongue  chest pain or chest tightness  chills  coughing up blood  high fever  seizures  severe constipation  signs and symptoms   of bleeding such as bloody or black, tarry stools; red or dark-brown urine; spitting up blood or brown material that looks like coffee grounds; red spots on the skin; unusual bruising or bleeding from the eye, gums, or nose  signs and symptoms of a blood clot such as breathing problems; chest pain; severe, sudden  headache; pain, swelling, warmth in the leg  signs and symptoms of a stroke like changes in vision; confusion; trouble speaking or understanding; severe headaches; sudden numbness or weakness of the face, arm or leg; trouble walking; dizziness; loss of balance or coordination  stomach pain  sweating  swelling of legs or ankles  vomiting  weight gain Side effects that usually do not require medical attention (report to your doctor or health care professional if they continue or are bothersome):  back pain  changes in taste  decreased appetite  dry skin  nausea  tiredness This list may not describe all possible side effects. Call your doctor for medical advice about side effects. You may report side effects to FDA at 1-800-FDA-1088. Where should I keep my medicine? This drug is given in a hospital or clinic and will not be stored at home. NOTE: This sheet is a summary. It may not cover all possible information. If you have questions about this medicine, talk to your doctor, pharmacist, or health care provider.  2020 Elsevier/Gold Standard (2016-01-24 14:33:29)  Ferumoxytol injection What is this medicine? FERUMOXYTOL is an iron complex. Iron is used to make healthy red blood cells, which carry oxygen and nutrients throughout the body. This medicine is used to treat iron deficiency anemia. This medicine may be used for other purposes; ask your health care provider or pharmacist if you have questions. COMMON BRAND NAME(S): Feraheme What should I tell my health care provider before I take this medicine? They need to know if you have any of these conditions:  anemia not caused by low iron levels  high levels of iron in the blood  magnetic resonance imaging (MRI) test scheduled  an unusual or allergic reaction to iron, other medicines, foods, dyes, or preservatives  pregnant or trying to get pregnant  breast-feeding How should I use this medicine? This medicine is for  injection into a vein. It is given by a health care professional in a hospital or clinic setting. Talk to your pediatrician regarding the use of this medicine in children. Special care may be needed. Overdosage: If you think you have taken too much of this medicine contact a poison control center or emergency room at once. NOTE: This medicine is only for you. Do not share this medicine with others. What if I miss a dose? It is important not to miss your dose. Call your doctor or health care professional if you are unable to keep an appointment. What may interact with this medicine? This medicine may interact with the following medications:  other iron products This list may not describe all possible interactions. Give your health care provider a list of all the medicines, herbs, non-prescription drugs, or dietary supplements you use. Also tell them if you smoke, drink alcohol, or use illegal drugs. Some items may interact with your medicine. What should I watch for while using this medicine? Visit your doctor or healthcare professional regularly. Tell your doctor or healthcare professional if your symptoms do not start to get better or if they get worse. You may need blood work done while you are taking this medicine. You may need to follow a special  diet. Talk to your doctor. Foods that contain iron include: whole grains/cereals, dried fruits, beans, or peas, leafy green vegetables, and organ meats (liver, kidney). What side effects may I notice from receiving this medicine? Side effects that you should report to your doctor or health care professional as soon as possible:  allergic reactions like skin rash, itching or hives, swelling of the face, lips, or tongue  breathing problems  changes in blood pressure  feeling faint or lightheaded, falls  fever or chills  flushing, sweating, or hot feelings  swelling of the ankles or feet Side effects that usually do not require medical  attention (report to your doctor or health care professional if they continue or are bothersome):  diarrhea  headache  nausea, vomiting  stomach pain This list may not describe all possible side effects. Call your doctor for medical advice about side effects. You may report side effects to FDA at 1-800-FDA-1088. Where should I keep my medicine? This drug is given in a hospital or clinic and will not be stored at home. NOTE: This sheet is a summary. It may not cover all possible information. If you have questions about this medicine, talk to your doctor, pharmacist, or health care provider.  2020 Elsevier/Gold Standard (2016-03-16 20:21:10)

## 2019-01-02 NOTE — Telephone Encounter (Signed)
Scheduled appt per 11/23 los.  Spoke with pt spouse and he requested if she can get a calendar.  Called infusion and her nurse will give her a print out of her appt dates and time.

## 2019-01-06 ENCOUNTER — Other Ambulatory Visit: Payer: Self-pay | Admitting: Internal Medicine

## 2019-01-06 DIAGNOSIS — E1165 Type 2 diabetes mellitus with hyperglycemia: Secondary | ICD-10-CM

## 2019-01-09 MED ORDER — OXYCODONE-ACETAMINOPHEN 2.5-325 MG PO TABS: 1.0000 | ORAL_TABLET | Freq: Every evening | ORAL | 0 refills | Status: DC | PRN

## 2019-01-09 NOTE — Telephone Encounter (Signed)
Pt husband is calling back regarding medicine; needs refills on  insulin lispro (HUMALOG KWIKPEN) 100 UNIT/ML KwikPen oxycodone-acetaminophen (PERCOCET) 2.5-325 MG tablet  [place], [address]

## 2019-01-09 NOTE — Telephone Encounter (Signed)
Out of insulin for two days. Requesting Tresiba insulin and her pain pills. She says she feels okay, with blood sugars up and down. Told her we'd try to get it sent today and to drink plenty of water. She verbalized understanding.

## 2019-01-30 ENCOUNTER — Ambulatory Visit (INDEPENDENT_AMBULATORY_CARE_PROVIDER_SITE_OTHER): Payer: Medicare Other | Admitting: Endocrinology

## 2019-01-30 ENCOUNTER — Other Ambulatory Visit: Payer: Self-pay

## 2019-01-30 ENCOUNTER — Encounter: Payer: Self-pay | Admitting: Endocrinology

## 2019-01-30 VITALS — BP 140/58 | HR 60 | Ht 63.0 in

## 2019-01-30 DIAGNOSIS — E1129 Type 2 diabetes mellitus with other diabetic kidney complication: Secondary | ICD-10-CM

## 2019-01-30 DIAGNOSIS — Z794 Long term (current) use of insulin: Secondary | ICD-10-CM

## 2019-01-30 DIAGNOSIS — IMO0002 Reserved for concepts with insufficient information to code with codable children: Secondary | ICD-10-CM

## 2019-01-30 DIAGNOSIS — E1165 Type 2 diabetes mellitus with hyperglycemia: Secondary | ICD-10-CM

## 2019-01-30 LAB — POCT GLYCOSYLATED HEMOGLOBIN (HGB A1C): Hemoglobin A1C: 6.9 % — AB (ref 4.0–5.6)

## 2019-01-30 LAB — GLUCOSE, POCT (MANUAL RESULT ENTRY): POC Glucose: 282 mg/dl — AB (ref 70–99)

## 2019-01-30 NOTE — Patient Instructions (Addendum)
Take Humalog daily at the start of each meal However may skip the Humalog if eating only a salad  If you are eating a small meal you can take 30 units but for regular meals take 5 units before each meal  TRESIBA: Reduce this to 19 units  Blood sugar checking: Do this twice a day regularly  You can check the sugar on waking up every other day  Also do one more reading about 2 to 3 hours after a different meal every day  Call if having blood sugars below 80  Try to reduce cereal in your diet, follow instructions given by dietitian

## 2019-01-30 NOTE — Progress Notes (Signed)
Patient ID: [name], female   DOB: Mar 29, 1940, 78 y.o.   MRN: [id number]           Reason for Appointment: Follow-up for Type 2 Diabetes  Referring PCP: [name]   History of Present Illness:          Date of diagnosis of type 2 diabetes mellitus: 1988       Background history:   Patient has been on insulin for a few years and she is unclear when this was started, according to Epic she was started on Levemir in 2015 Prior to that likely had been on various oral hypoglycemic agents including metformin, glipizide and Tradjenta Her level of control has been somewhat variable but not consistently poor; her A1c has ranged between 5.6 and 7.6  Recent history:   Her previous A1c is 8.7 done on 08/08/2018 It is now 6.9  INSULIN regimen is:  [place] 21 units in the mornings daily, Humalog 5 units before meals      Non-insulin hypoglycemic drugs the patient is taking are: Trulicity 1.5 mg weekly  Current management, blood sugar patterns and problems identified:  She was started on Humalog insulin on her initial consultation in 08/2018  She has not come back for follow-up since August  Although she has been instructed on her insulin routine and diet by the dietitian in October she is not following instructions  Was told to go up to 5 units on her Humalog at mealtimes because of relatively high readings but she appears unclear about what she is doing and how much she is taking  Does not appear to be taking her Humalog on a regular basis and today even when she had cereal at lunchtime she did not take any  She is mostly checking blood sugars fasting at bedtime and does not remember exact readings  She forgot her meter despite instructions to bring it  Lab glucose last month was 233 and was over 200 today also        Side effects from medications have been: None known     Meal times are:  Breakfast is at variable times between 8 AM-12 noon  Typical meal intake: Breakfast is  Cheerios or raisin Bran               Exercise:  Unable to do any  Glucose monitoring:  done?  1-2 times a day         Glucometer: One Probation officer.       Blood Glucose readings by recall by time of day    PRE-MEAL Fasting Lunch Dinner Bedtime Overall  Glucose range: 80-110 ?  89-130 80-279  Mean/median:        POST-MEAL PC Breakfast PC Lunch PC Dinner  Glucose range:  279   Mean/median:      Previous history of glucose:  PRE-MEAL Fasting Lunch Dinner Bedtime Overall  Glucose range: 105- ? 238  156-233   158-241   Mean/median:     ?    Dietician visit, most recent: 11/2018  Weight history:  Wt Readings from Last 3 Encounters:  01/02/19 151 lb 4.8 oz (68.6 kg)  11/28/18 148 lb 4.8 oz (67.3 kg)  11/24/18 146 lb (66.2 kg)    Glycemic control:   Lab Results  Component Value Date   HGBA1C 6.9 (A) 01/30/2019   HGBA1C 8.7 (A) 08/08/2018   HGBA1C 5.9 (A) 07/15/2017   Lab Results  Component Value Date   LDLCALC 82 11/29/2014  CREATININE 1.16 (H) 01/02/2019   Lab Results  Component Value Date   MICRALBCREAT 5 03/28/2018    No results found for: FRUCTOSAMINE  Office Visit on 01/30/2019  Component Date Value Ref Range Status  . POC Glucose 01/30/2019 282* 70 - 99 mg/dl Final  . Hemoglobin A1C 01/30/2019 6.9* 4.0 - 5.6 % Final    Allergies as of 01/30/2019      Reactions   Aspirin Nausea And Vomiting      Medication List       Accurate as of January 30, 2019  8:37 PM. If you have any questions, ask your nurse or doctor.        amLODipine 5 MG tablet Commonly known as: NORVASC Take 1 tablet (5 mg total) by mouth daily.   b complex vitamins capsule Take 1 capsule by mouth daily.   B-D UF III MINI PEN NEEDLES 31G X 5 MM Misc Generic drug: Insulin Pen Needle USE TO INJECT INSULIN  3 TIMES DAILY   calcium-vitamin D 500-200 MG-UNIT tablet Commonly known as: OSCAL WITH D Take 1 tablet by mouth 2 (two) times daily.   cetaphil lotion Apply 1  application topically as needed for dry skin.   donepezil 10 MG tablet Commonly known as: ARICEPT Take 1 tablet (10 mg total) by mouth at bedtime.   Ferrocite 324 (106 Fe) MG Tabs tablet Generic drug: Ferrous Fumarate Take 1 tablet by mouth twice daily   furosemide 20 MG tablet Commonly known as: LASIX Take 1 tablet (20 mg total) by mouth daily.   insulin lispro 100 UNIT/ML KwikPen Commonly known as: HumaLOG KwikPen Inject 3 units under the skin three times daily before each meal. May increase up to 6 units before meals if needed.   lactulose 10 GM/15ML solution Commonly known as: CHRONULAC TAKE 15 ML BY MOUTH TWO TIMES DAILY   levETIRAcetam 500 MG tablet Commonly known as: KEPPRA Take 1 tablet (500 mg total) by mouth 2 (two) times daily.   Medical Compression Stockings Misc Wear as much as possible while awake to reduce swelling   memantine 10 MG tablet Commonly known as: NAMENDA Take 1 tablet (10 mg total) by mouth 2 (two) times daily.   OneTouch Delica Lancets Fine Misc Check blood sugar up to 4 times a day   OneTouch Verio test strip Generic drug: glucose blood Use as instructed 4 times daily. E11.29, insulin requiring   OneTouch Verio w/Device Kit Use to check blood sugar up to 4 times a day   oxycodone-acetaminophen 2.5-325 MG tablet Commonly known as: PERCOCET Take 1 tablet by mouth at bedtime as needed for pain.   oxymetazoline 0.05 % nasal spray Commonly known as: AFRIN Place 1 spray into both nostrils 2 (two) times daily as needed (Epistaxis).   pantoprazole 40 MG tablet Commonly known as: PROTONIX Take 1 tablet by mouth once daily   sodium chloride 0.65 % Soln nasal spray Commonly known as: OCEAN Place 1 spray into both nostrils as needed for congestion.   Tyler Aas FlexTouch 100 UNIT/ML Sopn FlexTouch Pen Generic drug: insulin degludec INJECT 21 UNITS SUBCUTANEOUSLY IN THE EVENING   triamcinolone ointment 0.5 % Commonly known as: KENALOG  APPLY 1 APPLICATION TOPICALLY TO RASH TWICE DAILY FOR ITCHING What changed:   how much to take  how to take this  when to take this  reasons to take this  additional instructions   Trulicity 1.5 LX/7.2IO Sopn Generic drug: Dulaglutide INJECT 1.5 MG INTO THE SKIN ONCE A  WEEK EVERY TUESDAY       Allergies:  Allergies  Allergen Reactions  . Aspirin Nausea And Vomiting    Past Medical History:  Diagnosis Date  . Chronic anemia   . Chronic diastolic CHF (congestive heart failure) ([place]) 10/03/2013  . Chronic GI bleeding    [name] 11/29/2014  . Family history of anesthesia complication    "niece has a hard time coming out" (09/15/2012)  . Frequent nosebleeds    chronic  . Gastric AV malformation    [name] 11/29/2014  . GERD (gastroesophageal reflux disease)   . 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 History of blood transfusion "several"  . History of epistaxis   . HTN (hypertension), benign 03/02/2012  . Hyperlipidemia   . Iron deficiency anemia    chronic infusions"  . Lichen planus    Both lower extremities  . 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 Overgrown toenails 03/18/2017  . Pneumonia 1990's X 2  . Pulmonary HTN ([place]) 04/01/2017   PASP 109mHg on echo 09/2016 and 484mg by echo 2019  . Seizures [place]  . Symptomatic anemia 11/29/2014  . Telangiectasia    Gastric   . 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    Past Surgical History:  Procedure Laterality Date  . CATARACT EXTRACTION     "I think it was just one eye"  . ESOPHAGOGASTRODUODENOSCOPY  02/26/2011   Procedure: ESOPHAGOGASTRODUODENOSCOPY (EGD);  Surgeon: [name];  [facility];  Service: Endoscopy;  Laterality: N/A;  . ESOPHAGOGASTRODUODENOSCOPY N/A 11/08/2012   Procedure: ESOPHAGOGASTRODUODENOSCOPY (EGD);  Surgeon: [name];  [facility];  Service: Endoscopy;  Laterality: N/A;  .  ESOPHAGOGASTRODUODENOSCOPY N/A 10/04/2013   Procedure: ESOPHAGOGASTRODUODENOSCOPY (EGD);  Surgeon: [name];  [facility];  Service: Endoscopy;  Laterality: N/A;  with APC on stand-by  . ESOPHAGOGASTRODUODENOSCOPY N/A 07/06/2014   Procedure: ESOPHAGOGASTRODUODENOSCOPY (EGD);  Surgeon: [name];  [facility];  Service: Endoscopy;  Laterality: N/A;  . ESOPHAGOGASTRODUODENOSCOPY N/A 09/05/2014   Procedure: ESOPHAGOGASTRODUODENOSCOPY (EGD);  Surgeon: JaLaurence SpatesMD;  [facility];  Service: Endoscopy;  Laterality: N/A;  APC on standby to control bleeding  . ESOPHAGOGASTRODUODENOSCOPY N/A 11/29/2014   Procedure: ESOPHAGOGASTRODUODENOSCOPY (EGD);  Surgeon: [name];  [facility];  Service: Endoscopy;  Laterality: N/A;  . ESOPHAGOGASTRODUODENOSCOPY N/A 09/28/2015   Procedure: ESOPHAGOGASTRODUODENOSCOPY (EGD);  Surgeon: [name];  [facility];  Service: Endoscopy;  Laterality: N/A;  . ESOPHAGOGASTRODUODENOSCOPY (EGD) WITH PROPOFOL N/A 12/04/2017   Procedure: ESOPHAGOGASTRODUODENOSCOPY (EGD) WITH PROPOFOL;  Surgeon: [name];  [facility]: [facility];  Laterality: N/A;  . ESOPHAGOGASTRODUODENOSCOPY ENDOSCOPY  08/19/2006   with laser treatment  . HOT HEMOSTASIS N/A 07/06/2014   Procedure: HOT HEMOSTASIS (ARGON PLASMA COAGULATION/BICAP);  Surgeon: [name];  [facility];  Service: Endoscopy;  Laterality: N/A;  . HOT HEMOSTASIS N/A 09/28/2015   Procedure: HOT HEMOSTASIS (ARGON PLASMA COAGULATION/BICAP);  Surgeon: [name];  [facility];  Service: Endoscopy;  Laterality: N/A;  . HOT HEMOSTASIS N/A 12/04/2017   Procedure: HOT HEMOSTASIS (ARGON PLASMA COAGULATION/BICAP);  Surgeon: [name];  [facility]: [facility];  Laterality: N/A;  . NASAL HEMORRHAGE CONTROL     "for bleeding"   . SAVORY DILATION  02/26/2011    Procedure: SAVORY DILATION;  Surgeon: [name];  [facility];  Service: Endoscopy;  Laterality: N/A;  c-arm needed  . SUBMUCOSAL INJECTION  12/04/2017   Procedure: SUBMUCOSAL INJECTION;  Surgeon: [name], MD;  [facility];  Service: Endoscopy;;    Family History  Problem Relation Age of Onset  . Stroke Father   . Healthy Mother   . Breast cancer Other   . Diabetes Niece   . Malignant hyperthermia Neg Hx   . Seizures Neg Hx     Social History:  reports that she quit smoking about 48 years ago. Her smoking use included cigarettes. She has a 20.00 pack-year smoking history. She has never used smokeless tobacco. She reports that she does not drink alcohol or use drugs.   Review of Systems  Hematology: She has had history of iron deficiency anemia requiring infusions every 1 to 2 months  Lab Results  Component Value Date   HGB 13.3 01/02/2019     Lipid history: No labs currently available, not on any statin drugs    Lab Results  Component Value Date   CHOL 141 11/29/2014   HDL 31 (L) 11/29/2014   LDLCALC 82 11/29/2014   TRIG 141 11/29/2014   CHOLHDL 4.5 11/29/2014           Hypertension: Has not been on treatment for high blood pressure  BP Readings from Last 3 Encounters:  01/30/19 (!) 140/58  01/02/19 (!) 153/48  01/02/19 (!) 177/53    Most recent eye exam was in 07/2018  Most recent foot exam: 06/2018 by podiatrist with no evidence of sensory loss or reduction in sensation   Complications of diabetes: Reportedly has retinopathy, currently without a foot exam not clear if she has neuropathy.  No history of microalbuminuria  LABS:  Office Visit on 01/30/2019  Component Date Value Ref Range Status  . POC Glucose 01/30/2019 282* 70 - 99 mg/dl Final  . Hemoglobin A1C 01/30/2019 6.9* 4.0 - 5.6 % Final    Physical Examination:  BP (!) 140/58 (BP Location: Right Arm, Patient Position: Sitting, Cuff Size: Normal)   Pulse 60    Ht '5\' 3"'$  (1.6 m)   LMP  (LMP Unknown)   SpO2 97%   BMI 26.80 kg/m    ASSESSMENT:  Diabetes type 2 on insulin  See history of present illness for detailed discussion of current diabetes management, blood sugar patterns and problems identified  Her last A1c was 8.7 and is now surprisingly better at 6.9  She is on Trulicity and a regular regimen of Humalog with her Tyler Aas  Although she is very regular with her Humalog and still has some readings over 200 including in the lab on 2 occasions her A1c is surprisingly better.  No recent anemia or renal disorder to account for change in A1c  However she thinks her fasting blood sugars are frequently below 100 She likely does not remember to take her Humalog and probably takes it after eating despite reminders by dietitian/nurse educator to take before eating Although her diet may be better she is still eating cereal sometimes even at lunch such as today   RENAL dysfunction appears improved as of 11/20  PLAN:    1. Glucose monitoring: She will be given instructions on when to use her One Touch meter 2. Discussed that she needs to bring this for better history of her blood sugars and to make sure it is accurate . Patient advised to check readings either fasting or 2 hours after meals on a regular basis and  detailed instructions given  2.  Diabetes education:  . May need consultation with CDE for evaluation of her insulin regimen and compliance and general education  3.  Lifestyle changes: . Dietary changes: Balanced breakfast meals with protein, reduce cereals   4.  Medication changes needed: . Take 3- 5 units of Humalog before every meal based on meal size and can skip for salads . She will use only 19 units of T[place]iba since she has low normal blood sugars . Discussed how to adjust the insulin for meals; also her husband was present in the office to help with her understanding and compliance  Follow-up in 6 weeks  Detailed  written instructions for home use were reviewed with patient   Patient Instructions  Take Humalog daily at the start of each meal However may skip the Humalog if eating only a salad  If you are eating a small meal you can take 30 units but for regular meals take 5 units before each meal  TRESIBA: Reduce this to 19 units  Blood sugar checking: Do this twice a day regularly  You can check the sugar on waking up every other day  Also do one more reading about 2 to 3 hours after a different meal every day  Call if having blood sugars below 80  Try to reduce cereal in your diet, follow instructions given by dietitian  Counseling time on subjects discussed in assessment and plan sections is over 50% of today's 25 minute visit       [name] 01/30/2019, 8:37 PM   Note: This office note was prepared with Dragon voice recognition system technology. Any transcriptional errors that result from this process are unintentional.

## 2019-01-31 LAB — BASIC METABOLIC PANEL
BUN: 22 mg/dL (ref 6–23)
CO2: 26 mEq/L (ref 19–32)
Calcium: 9.2 mg/dL (ref 8.4–10.5)
Chloride: 106 mEq/L (ref 96–112)
Creatinine, Ser: 1.14 mg/dL (ref 0.40–1.20)
GFR: 55.65 mL/min — ABNORMAL LOW (ref >60.00)
Glucose, Bld: 296 mg/dL — ABNORMAL HIGH (ref 70–99)
Potassium: 5 mEq/L (ref 3.5–5.1)
Sodium: 139 mEq/L (ref 135–145)

## 2019-01-31 LAB — LIPID PANEL
Cholesterol: 133 mg/dL (ref 0–200)
HDL: 41.3 mg/dL (ref >39.00)
LDL Cholesterol: 57 mg/dL (ref 0–99)
NonHDL: 91.71
Total CHOL/HDL Ratio: 3
Triglycerides: 176 mg/dL — ABNORMAL HIGH (ref 0.0–149.0)
VLDL: 35.2 mg/dL (ref 0.0–40.0)

## 2019-02-13 ENCOUNTER — Other Ambulatory Visit: Payer: Self-pay | Admitting: Internal Medicine

## 2019-02-13 DIAGNOSIS — IMO0002 Reserved for concepts with insufficient information to code with codable children: Secondary | ICD-10-CM

## 2019-02-13 DIAGNOSIS — E1121 Type 2 diabetes mellitus with diabetic nephropathy: Secondary | ICD-10-CM

## 2019-02-13 MED ORDER — OXYCODONE-ACETAMINOPHEN 2.5-325 MG PO TABS: 1.0000 | ORAL_TABLET | Freq: Every evening | ORAL | 0 refills | Status: DC | PRN

## 2019-02-13 MED ORDER — ONETOUCH VERIO VI STRP: ORAL_STRIP | 3 refills | Status: DC

## 2019-02-13 NOTE — Telephone Encounter (Signed)
Pt calling back requesting  Refill glucose blood (ONETOUCH VERIO) test strip  [facility] - [place], [place] - [address]

## 2019-02-13 NOTE — Telephone Encounter (Signed)
Last rx written 01/09/19. Last OV  11/28/18. Next OV 03/06/19. UDS 10/02/14.

## 2019-02-13 NOTE — Telephone Encounter (Signed)
Refill Request   oxycodone-acetaminophen (PERCOCET) 2.5-325 MG tablet   [facility] - [place], [place] - [address]

## 2019-02-16 ENCOUNTER — Other Ambulatory Visit: Payer: Self-pay | Admitting: Internal Medicine

## 2019-02-16 DIAGNOSIS — E1165 Type 2 diabetes mellitus with hyperglycemia: Secondary | ICD-10-CM

## 2019-02-16 DIAGNOSIS — Z794 Long term (current) use of insulin: Secondary | ICD-10-CM

## 2019-02-25 ENCOUNTER — Other Ambulatory Visit: Payer: Self-pay | Admitting: Internal Medicine

## 2019-02-25 DIAGNOSIS — E1165 Type 2 diabetes mellitus with hyperglycemia: Secondary | ICD-10-CM

## 2019-02-25 DIAGNOSIS — Z794 Long term (current) use of insulin: Secondary | ICD-10-CM

## 2019-02-27 NOTE — Progress Notes (Signed)
[place]   [phone] [phone]   Clinic Follow up Note   Patient Care Team: Delice Bison, DO as PCP - General [name], MD as PCP - Cardiology (Cardiology)  Date of Service:  03/03/2019   CHIEF COMPLAINT: F/u ofAnemia due to chronic blood loss, HHT  HEMATOLOGY HISTORY 1.Hereditary hemorrhagic telangiectasia (HHT),with epistaxis andfrequentGI bleeding. Previously under care of Dr. [name] 2.Anemia secondary to GI bleeding and iron deficiency, required blood transfusion every 3 to 4 weeks, IV Feraheme every 1 to 2 months  CURRENT THERAPY 1. Iv feraheme as needed if ferritin<50 2.Avastin67m/kg every 2 weeks, starting3/30/20. Reduced to every 4 weeks starting 11/07/18.Reduced to every 2 months starting 01/02/19.  3. Blood transfusion if Hg<8.0  INTERVAL HISTORY:  BManitowocis here for a follow up. She presents to the clinic alone. She notes she is doing fairly well. She notes she still has leg pain and ambulates with walker. She notes her energy is stable. She notes only having occasional epistaxis. She notes at home her husband helps her and her niece helps with transportation. She notes her BG has been mostly controlled and will occasionally reach 200.  She plans to see Dr BKoleen Distanceon 1/25.    REVIEW OF SYSTEMS:   Constitutional: Denies fevers, chills or abnormal weight loss Eyes: Denies blurriness of vision Ears, nose, mouth, throat, and face: Denies mucositis or sore throat (+) Occasional epistaxis  Respiratory: Denies cough, dyspnea or wheezes Cardiovascular: Denies palpitation, chest discomfort or lower extremity swelling Gastrointestinal:  Denies nausea, heartburn or change in bowel habits Skin: Denies abnormal skin rashes MSK: (+) Chronic leg pain  Lymphatics: Denies new lymphadenopathy or easy bruising Neurological:Denies numbness, tingling or new weaknesses Behavioral/Psych: Mood is stable, no  new changes  All other systems were reviewed with the patient and are negative.  MEDICAL HISTORY:  Past Medical History:  Diagnosis Date  . Chronic anemia   . Chronic diastolic CHF (congestive heart failure) ([facility] 10/03/2013  . Chronic GI bleeding    /[name]10/20/2016  . Family history of anesthesia complication    "niece has a hard time coming out" (09/15/2012)  . Frequent nosebleeds    chronic  . Gastric AV malformation    /[name]10/20/2016  . GERD (gastroesophageal reflux disease)   . 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 History of blood transfusion "several"  . History of epistaxis   . HTN (hypertension), benign 03/02/2012  . Hyperlipidemia   . Iron deficiency anemia    chronic infusions"  . Lichen planus    Both lower extremities  . 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 Overgrown toenails 03/18/2017  . Pneumonia 1990's X 2  . Pulmonary HTN ([facility] 04/01/2017   PASP 591mg on echo 09/2016 and 4524m by echo 2019  . [place]  . Symptomatic anemia 11/29/2014  . Telangiectasia    Gastric   . 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    SURGICAL HISTORY: Past Surgical History:  Procedure Laterality Date  . CATARACT EXTRACTION     "I think it was just one eye"  . ESOPHAGOGASTRODUODENOSCOPY  02/26/2011   Procedure: ESOPHAGOGASTRODUODENOSCOPY (EGD);  Surgeon: JohMissy SabinsD;  [facility];  Service: Endoscopy;  Laterality: N/A;  . ESOPHAGOGASTRODUODENOSCOPY N/A 11/08/2012   Procedure: ESOPHAGOGASTRODUODENOSCOPY (EGD);  Surgeon: PatBeryle BeamsD;  [facility];  Service: Endoscopy;  Laterality: N/A;  .  ESOPHAGOGASTRODUODENOSCOPY N/A 10/04/2013   Procedure: ESOPHAGOGASTRODUODENOSCOPY (EGD);  Surgeon: Winfield Cunas., MD;  [name];  Service: Endoscopy;  Laterality: N/A;  with APC on stand-by  . ESOPHAGOGASTRODUODENOSCOPY N/A 07/06/2014   Procedure: ESOPHAGOGASTRODUODENOSCOPY  (EGD);  Surgeon: Clarene Essex, MD;  [name];  Service: Endoscopy;  Laterality: N/A;  . ESOPHAGOGASTRODUODENOSCOPY N/A 09/05/2014   Procedure: ESOPHAGOGASTRODUODENOSCOPY (EGD);  Surgeon: Laurence Spates, MD;  [name];  Service: Endoscopy;  Laterality: N/A;  APC on standby to control bleeding  . ESOPHAGOGASTRODUODENOSCOPY N/A 11/29/2014   Procedure: ESOPHAGOGASTRODUODENOSCOPY (EGD);  Surgeon: [name], MD;  [facility] END[facility]COPY;  Service: Endoscopy;  Laterality: N/A;  . ESOPHAGOGASTRODUODENOSCOPY N/A 09/28/2015   Procedure: ESOPHAGOGASTRODUODENOSCOPY (EGD);  Surgeon: Clarene Essex, MD;  Location[facility] ENDOSCOPY;  Service: Endoscopy;  Laterality: N/A;  . ESOPHAGOGASTRODUODENOSCOPY (EGD) WITH PROPOFOL N/A 12/04/2017   Procedure: ESOPHAGOGASTRODUODENOSCOPY (EGD) WITH PROPOFOL;  Surgeon: [name], MD;  Location: [place];  Service: Endoscopy;  Laterality: N/A;  . ESOPHAGOGASTRODUODENOSCOPY ENDOSCOPY  08/19/2006   with laser treatment  . HOT HEMOSTASIS N/A 07/06/2014   Procedure: HOT HEMOSTASIS (ARGON PLASMA COAGULATION/BICAP);  Surgeon: Clarene Essex, MD;  [name];  Service: Endoscopy;  Laterality: N/A;  . HOT HEMOSTASIS N/A 09/28/2015   Procedure: HOT HEMOSTASIS (ARGON PLASMA COAGULATION/BICAP);  Surgeon: Clarene Essex, MD;  Location[facility] ENDOSCOPY;  Service: Endoscopy;  Laterality: N/A;  . HOT HEMOSTASIS N/A 12/04/2017   Procedure: HOT HEMOSTASIS (ARGON PLASMA COAGULATION/BICAP);  Surgeon: [name], MD;  Location: [place];  Service: Endoscopy;  Laterality: N/A;  . NASAL HEMORRHAGE CONTROL     "for bleeding"   . SAVORY DILATION  02/26/2011   Procedure: SAVORY DILATION;  Surgeon: [name], MD;  [name];  Service: Endoscopy;  Laterality: N/A;  c-arm needed  . SUBMUCOSAL INJECTION  12/04/2017   Procedure: SUBMUCOSAL INJECTION;  Surgeon: [name], MD;  Location: [facility] ENDOSCOPY;  Service: Endoscopy;;    I have  reviewed the social history and family history with the patient and they are unchanged from previous note.  ALLERGIES:  is allergic to aspirin.  MEDICATIONS:  Current Outpatient Medications  Medication Sig Dispense Refill  . amLODipine (NORVASC) 5 MG tablet Take 1 tablet (5 mg total) by mouth daily. 30 tablet 3  . b complex vitamins capsule Take 1 capsule by mouth daily. 100 capsule 2  . Blood Glucose Monitoring Suppl (ONETOUCH VERIO) w/Device KIT Use to check blood sugar up to 4 times a day 1 kit 1  . calcium-vitamin D (OSCAL WITH D) 500-200 MG-UNIT tablet Take 1 tablet by mouth 2 (two) times daily.    . cetaphil (CETAPHIL) lotion Apply 1 application topically as needed for dry skin. 236 mL 0  . donepezil (ARICEPT) 10 MG tablet Take 1 tablet (10 mg total) by mouth at bedtime. 90 tablet 4  . Elastic Bandages & Supports (MEDICAL COMPRESSION STOCKINGS) MISC Wear as much as possible while awake to reduce swelling 2 each 0  . FERROCITE 324 MG TABS tablet Take 1 tablet by mouth twice daily 60 tablet 5  . furosemide (LASIX) 20 MG tablet Take 1 tablet (20 mg total) by mouth daily. 90 tablet 3  . glucose blood (ONETOUCH VERIO) test strip Use as instructed 4 times daily. E11.29, insulin requiring 350 each 3  . insulin lispro (HUMALOG KWIKPEN) 100 UNIT/ML KwikPen Inject 3 units under the skin three times daily before each meal. May increase up to 6 units before meals if needed. 15 mL  1  . Insulin Pen Needle (B-D UF III MINI PEN NEEDLES) 31G X 5 MM MISC USE TO INJECT INSULIN  3 TIMES DAILY 100 each 0  . lactulose (CHRONULAC) 10 GM/15ML solution TAKE 15 ML BY MOUTH TWO TIMES DAILY 473 mL 2  . levETIRAcetam (KEPPRA) 500 MG tablet Take 1 tablet (500 mg total) by mouth 2 (two) times daily. 180 tablet 4  . memantine (NAMENDA) 10 MG tablet Take 1 tablet (10 mg total) by mouth 2 (two) times daily. 180 tablet 4  . ONETOUCH DELICA LANCETS FINE MISC Check blood sugar up to 4 times a day 300 each 4  .  oxycodone-acetaminophen (PERCOCET) 2.5-325 MG tablet Take 1 tablet by mouth at bedtime as needed for pain. 30 tablet 0  . oxymetazoline (AFRIN) 0.05 % nasal spray Place 1 spray into both nostrils 2 (two) times daily as needed (Epistaxis). 30 mL 0  . pantoprazole (PROTONIX) 40 MG tablet Take 1 tablet by mouth once daily 180 tablet 0  . sodium chloride (OCEAN) 0.65 % SOLN nasal spray Place 1 spray into both nostrils as needed for congestion. 60 mL 0  . TRESIBA FLEXTOUCH 100 UNIT/ML SOPN FlexTouch Pen INJECT 21 UNITS SUBCUTANEOUSLY IN THE EVENING 15 mL 0  . triamcinolone ointment (KENALOG) 0.5 % APPLY 1 APPLICATION TOPICALLY TO RASH TWICE DAILY FOR ITCHING (Patient taking differently: Apply 1 application topically 2 (two) times daily as needed (itching). ) 15 g 1  . TRULICITY 1.5 GU/5.4YH SOPN INJECT 1.5 MG INTO THE SKIN ONCE A WEEK EVERY TUESDAY 8 mL 0   No current facility-administered medications for this visit.    PHYSICAL EXAMINATION: ECOG PERFORMANCE STATUS: 3 - Symptomatic, >50% confined to bed  Vitals:   03/03/19 1007  BP: (!) 155/54  Pulse: (!) 55  Resp: 17  Temp: 98.3 F (36.8 C)  SpO2: 100%   Filed Weights   03/03/19 1007  Weight: 153 lb 14.4 oz (69.8 kg)    Due to COVID19 we will limit examination to appearance. Patient had no complaints.  GENERAL:alert, no distress and comfortable SKIN: skin color normal, no rashes or significant lesions EYES: normal, Conjunctiva are pink and non-injected, sclera clear  NEURO: alert & oriented x 3 with fluent speech   LABORATORY DATA:  I have reviewed the data as listed CBC Latest Ref Rng & Units 03/03/2019 01/02/2019 12/05/2018  WBC 4.0 - 10.5 K/uL 6.4 4.7 5.5  Hemoglobin 12.0 - 15.0 g/dL 14.1 13.3 14.8  Hematocrit 36.0 - 46.0 % 41.7 39.3 42.7  Platelets 150 - 400 K/uL 143(L) 121(L) 117(L)     CMP Latest Ref Rng & Units 01/30/2019 01/02/2019 10/24/2018  Glucose 70 - 99 mg/dL 296(H) 233(H) 164(H)  BUN 6 - 23 mg/dL _0 Creatinine 0.40 - 1.20 mg/dL 1.14 1.16(H) 1.20(H)  Sodium 135 - 145 mEq/L 139 143 142  Potassium 3.5 - 5.1 mEq/L 5.0 4.5 5.4(H)  Chloride 96 - 112 mEq/L 106 107 107(H)  CO2 19 - 32 mEq/L 26 27 18(L)  Calcium 8.4 - 10.5 mg/dL 9.2 10.1 9.6  Total Protein 6.5 - 8.1 g/dL - 6.6 -  Total Bilirubin 0.3 - 1.2 mg/dL - 0.8 -  Alkaline Phos 38 - 126 U/L - 194(H) -  AST 15 - 41 U/L - 101(H) -  ALT 0 - 44 U/L - 96(H) -      RADIOGRAPHIC STUDIES: I have personally reviewed the radiological images as listed and agreed with the findings in the report.  No results found.   ASSESSMENT & PLAN:  [name] is a 79 y.o. female with    1.Hereditary hemorrhagic telangiectasia(HHT)), withepistaxis and frequent GI bleeding -She has mild epistaxis, and frequent GI bleeding,she used torequire blood transfusion every 3 to 4 weeks. Her last blood transfusion was on 04/13/2018 -She started Avastin on 05/09/18.she responded very well, anemia resolved. I have changed it to every 2 months since Nov 2020.  -She has responded well. Labs reviewed, CBC WNL except Plt 143K. Urine protein has significantly increased, 300 today. Ferritin still pending. Given significant proteinuria, which is likely related to Avastin, will collect 24hr urine for testing. She is agreeable. Will hold Avastin today  -continue IVFeraheme if ferritin<50and Continue oral iron. -F/u in1 month. If proteinuria improves, will restart Avastin   2. 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   3.Iron deficiency anemia secondary to #2  -She does not tolerate oral ironwell but is on it -will continueIV Feraheme if ferritin less than 50.  Last IV Feraheme8/3/20.   4.Dementia -She takes Ariceptand memantine -f/u with neurology. She has not been seen in some time. 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  5. 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 She mainly gets around by wheelchair and can ambulate with walker.  -She previously tookTylox, Percocet and Tramadol. She can obtain refills from her PCP if needed.  -Stable.   6. DM,CHFand CKD stage III -She is on insulin, DM not well controlled. She is seen by Dr. Dwyane Dee -She will continue to monitor her blood glucose at home -CKD andCHF stable clinically.   7. Hypertension, secondary to Avastin -She has not been on HTN medication before.  -Per pt and her husband SBP is elevated in 150-170 range at home.  -On 01/02/19 I started her on 26m amlodipine with mild improvement.   8. Worsening proteinuria -Likely related to Avastin, she has diabetes, which can cause proteinuria also -I will check 24-hour urine protein and albumin levels -We will ReachOut her PCP and endocrinologist to see if ACE would be a good option for her   Plan -Copy message to Dr BKoleen Distanceand Dr KDwyane Deetoday, if they can start ACEI or ARB for her HTN and proteinuria   -Labs reviewed and due to proteinuria will not proceed with Avastin today -Collect 24 hr urine on next visit  -Lab and F/u and avastin in 1 month, Feraheme if ferritin<50 -Continue oral iron daily   -I spoke with her husband during her visit today    No problem-specific Assessment & Plan notes found for this encounter.   Orders Placed This Encounter  Procedures  . CMP ([facility])    Standing Status:   Standing    Number of Occurrences:   50    Standing Expiration Date:   03/02/2024  . Protein, urine, 24 hour    Standing Status:   Future    Standing Expiration Date:   03/02/2020  . Microalbumin, urine,  24 hour    Standing Status:   Future    Standing Expiration Date:   03/02/2020   All questions were answered.  The patient knows to call the clinic with any problems, questions or concerns. No barriers to learning was detected. The total time spent in the appointment was 30 minutes.     Truitt Merle, MD 03/03/2019   I, Joslyn Devon, am acting as scribe for Truitt Merle, MD.   I have reviewed the above documentation for accuracy and completeness, and I agree with the above.

## 2019-03-03 ENCOUNTER — Inpatient Hospital Stay: Payer: Medicare Other

## 2019-03-03 ENCOUNTER — Telehealth: Payer: Self-pay

## 2019-03-03 ENCOUNTER — Encounter: Payer: Self-pay | Admitting: Hematology

## 2019-03-03 ENCOUNTER — Inpatient Hospital Stay: Payer: Medicare Other | Attending: Hematology

## 2019-03-03 ENCOUNTER — Other Ambulatory Visit: Payer: Self-pay

## 2019-03-03 ENCOUNTER — Inpatient Hospital Stay (HOSPITAL_BASED_OUTPATIENT_CLINIC_OR_DEPARTMENT_OTHER): Payer: Medicare Other | Admitting: Hematology

## 2019-03-03 VITALS — BP 155/54 | HR 55 | Temp 98.3°F | Resp 17 | Ht 63.0 in | Wt 153.9 lb

## 2019-03-03 DIAGNOSIS — K922 Gastrointestinal hemorrhage, unspecified: Secondary | ICD-10-CM | POA: Insufficient documentation

## 2019-03-03 DIAGNOSIS — R040 Epistaxis: Secondary | ICD-10-CM | POA: Diagnosis not present

## 2019-03-03 DIAGNOSIS — I130 Hypertensive heart and chronic kidney disease with heart failure and stage 1 through stage 4 chronic kidney disease, or unspecified chronic kidney disease: Secondary | ICD-10-CM | POA: Diagnosis not present

## 2019-03-03 DIAGNOSIS — R809 Proteinuria, unspecified: Secondary | ICD-10-CM | POA: Diagnosis not present

## 2019-03-03 DIAGNOSIS — D500 Iron deficiency anemia secondary to blood loss (chronic): Secondary | ICD-10-CM

## 2019-03-03 DIAGNOSIS — I780 Hereditary hemorrhagic telangiectasia: Secondary | ICD-10-CM | POA: Diagnosis present

## 2019-03-03 DIAGNOSIS — I5032 Chronic diastolic (congestive) heart failure: Secondary | ICD-10-CM | POA: Insufficient documentation

## 2019-03-03 DIAGNOSIS — E785 Hyperlipidemia, unspecified: Secondary | ICD-10-CM | POA: Insufficient documentation

## 2019-03-03 DIAGNOSIS — K219 Gastro-esophageal reflux disease without esophagitis: Secondary | ICD-10-CM | POA: Diagnosis not present

## 2019-03-03 DIAGNOSIS — I272 Pulmonary hypertension, unspecified: Secondary | ICD-10-CM | POA: Insufficient documentation

## 2019-03-03 DIAGNOSIS — Z79899 Other long term (current) drug therapy: Secondary | ICD-10-CM | POA: Insufficient documentation

## 2019-03-03 DIAGNOSIS — F0390 Unspecified dementia without behavioral disturbance: Secondary | ICD-10-CM | POA: Insufficient documentation

## 2019-03-03 DIAGNOSIS — K5521 Angiodysplasia of colon with hemorrhage: Secondary | ICD-10-CM | POA: Insufficient documentation

## 2019-03-03 DIAGNOSIS — E1122 Type 2 diabetes mellitus with diabetic chronic kidney disease: Secondary | ICD-10-CM | POA: Diagnosis not present

## 2019-03-03 DIAGNOSIS — Z95828 Presence of other vascular implants and grafts: Secondary | ICD-10-CM

## 2019-03-03 DIAGNOSIS — K31819 Angiodysplasia of stomach and duodenum without bleeding: Secondary | ICD-10-CM

## 2019-03-03 LAB — CBC WITH DIFFERENTIAL (CANCER CENTER ONLY)
Abs Immature Granulocytes: 0.02 10*3/uL (ref 0.00–0.07)
Basophils Absolute: 0.1 10*3/uL (ref 0.0–0.1)
Basophils Relative: 1 %
Eosinophils Absolute: 0.6 10*3/uL — ABNORMAL HIGH (ref 0.0–0.5)
Eosinophils Relative: 10 %
HCT: 41.7 % (ref 36.0–46.0)
Hemoglobin: 14.1 g/dL (ref 12.0–15.0)
Immature Granulocytes: 0 %
Lymphocytes Relative: 11 %
Lymphs Abs: 0.7 10*3/uL (ref 0.7–4.0)
MCH: 30.3 pg (ref 26.0–34.0)
MCHC: 33.8 g/dL (ref 30.0–36.0)
MCV: 89.5 fL (ref 80.0–100.0)
Monocytes Absolute: 0.6 10*3/uL (ref 0.1–1.0)
Monocytes Relative: 10 %
Neutro Abs: 4.4 10*3/uL (ref 1.7–7.7)
Neutrophils Relative %: 68 %
Platelet Count: 143 10*3/uL — ABNORMAL LOW (ref 150–400)
RBC: 4.66 MIL/uL (ref 3.87–5.11)
RDW: 13.9 % (ref 11.5–15.5)
WBC Count: 6.4 10*3/uL (ref 4.0–10.5)
nRBC: 0 % (ref 0.0–0.2)

## 2019-03-03 LAB — FERRITIN: Ferritin: 174 ng/mL (ref 11–307)

## 2019-03-03 LAB — TOTAL PROTEIN, URINE DIPSTICK: Protein, ur: 300 mg/dL — AB

## 2019-03-03 MED ORDER — SODIUM CHLORIDE 0.9% FLUSH
10.0000 mL | INTRAVENOUS | Status: DC | PRN
  Administered 2019-03-03: 10 mL
  Filled 2019-03-03: qty 10

## 2019-03-03 NOTE — Patient Instructions (Signed)

## 2019-03-03 NOTE — Telephone Encounter (Signed)
I spoke with Mr . Gittens and reviewed the instructions for collection of 24 hour urine.  He verbalized understanding.  I encouraged him to call if he has any questions.

## 2019-03-06 ENCOUNTER — Telehealth: Payer: Self-pay | Admitting: Hematology

## 2019-03-06 ENCOUNTER — Encounter: Payer: Medicare Other | Admitting: Internal Medicine

## 2019-03-06 NOTE — Telephone Encounter (Signed)
Scheduled appt per 1/22 los.  Left a vm of the appt date and time,

## 2019-03-07 ENCOUNTER — Other Ambulatory Visit: Payer: Self-pay | Admitting: *Deleted

## 2019-03-07 DIAGNOSIS — I780 Hereditary hemorrhagic telangiectasia: Secondary | ICD-10-CM | POA: Diagnosis not present

## 2019-03-07 DIAGNOSIS — Z794 Long term (current) use of insulin: Secondary | ICD-10-CM

## 2019-03-07 DIAGNOSIS — E1165 Type 2 diabetes mellitus with hyperglycemia: Secondary | ICD-10-CM

## 2019-03-07 LAB — PROTEIN, URINE, 24 HOUR
Collection Interval-UPROT: 24 hours
Protein, 24H Urine: 2739 mg/d — ABNORMAL HIGH (ref 50–100)
Protein, Urine: 166 mg/dL
Urine Total Volume-UPROT: 1650 mL

## 2019-03-07 MED ORDER — TRULICITY 1.5 MG/0.5ML ~~LOC~~ SOAJ: SUBCUTANEOUS | 0 refills | Status: DC

## 2019-03-07 NOTE — Telephone Encounter (Signed)
Fax from [facility] - stating pt's insurance is requiring a 3 month supply for Trulicity. Rx sent qty# 8 is a 2 month supply. Please send rx with qty# 12 which is a 3 month supply. Thanks

## 2019-03-08 LAB — MICROALBUMIN, URINE, 24 HOUR
Microalb, 24H Ur: 2128 mg/d — ABNORMAL HIGH (ref 0–29)
Microalb, Ur: 1289.4 ug/mL — ABNORMAL HIGH
Total Volume: 1650

## 2019-03-09 ENCOUNTER — Telehealth: Payer: Self-pay

## 2019-03-09 NOTE — Telephone Encounter (Signed)
Spoke with patient and her husband and explained per Dr. Burr Medico that her urine had a large amount of protein in it and Dr. Burr Medico will hold the Avastin for now.  We will monitor her blood work to see if she needs iv iron or blood when she comes in on 2/19 for her appointments.  She and her husband verbalized an understanding.

## 2019-03-09 NOTE — Telephone Encounter (Signed)
-----   Message from Truitt Merle, MD sent at 03/08/2019 11:05 PM EST ----- Please let pt know her urine test result, she has large amount protein in urine, I will hold Avastin for now. Will continue monitor her CBC, and give iv iron and blood transfusion if needed. Thanks   Truitt Merle  03/08/2019

## 2019-03-13 ENCOUNTER — Other Ambulatory Visit: Payer: Self-pay

## 2019-03-14 ENCOUNTER — Ambulatory Visit (INDEPENDENT_AMBULATORY_CARE_PROVIDER_SITE_OTHER): Payer: Medicare Other | Admitting: Endocrinology

## 2019-03-14 ENCOUNTER — Encounter: Payer: Self-pay | Admitting: Endocrinology

## 2019-03-14 VITALS — BP 140/60 | HR 53 | Ht 63.0 in | Wt 151.2 lb

## 2019-03-14 DIAGNOSIS — E1165 Type 2 diabetes mellitus with hyperglycemia: Secondary | ICD-10-CM | POA: Diagnosis not present

## 2019-03-14 DIAGNOSIS — N182 Chronic kidney disease, stage 2 (mild): Secondary | ICD-10-CM | POA: Diagnosis not present

## 2019-03-14 DIAGNOSIS — Z794 Long term (current) use of insulin: Secondary | ICD-10-CM

## 2019-03-14 NOTE — Patient Instructions (Signed)
HUMALOG BEFORE EVERY MEAL regardless of sugar level   Take 5 units at Bfst and lunch and 6 units at supper  Tresiba 16 units daily

## 2019-03-14 NOTE — Progress Notes (Signed)
Patient ID: [name], female   DOB: 1940/11/19, 79 y.o.   MRN: [id number]           Reason for Appointment: Follow-up for Type 2 Diabetes  Referring PCP: Koleen Distance   History of Present Illness:          Date of diagnosis of type 2 diabetes mellitus: 1988       Background history:   Patient has been on insulin for a few years and she is unclear when this was started, according to Epic she was started on Levemir in 2015 Prior to that likely had been on various oral hypoglycemic agents including metformin, glipizide and Tradjenta Her level of control has been somewhat variable but not consistently poor; her A1c has ranged between 5.6 and 7.6  Recent history:   Her previous A1c is 6.9 in 12/20  INSULIN regimen is:  [place] 19 units in the mornings daily, Humalog 4 units before meals      Non-insulin hypoglycemic drugs the patient is taking are: Trulicity 1.5 mg weekly  Current management, blood sugar patterns and problems identified:  She was started on Humalog insulin on her initial consultation in 08/2018  She still has marked increase in her blood sugars nonfasting  She is a poor historian but she thinks she is taking her Humalog only a couple of times a week  Her Tyler Aas was reduced by 2 units on the last visit  However her FASTING blood sugars are recently mostly below 100 and as low as 66  Also had a glucose of 59 early morning at 6 AM last month  Although her husband has been previously helping her with compliance with her insulin she either does not understand the need for mealtime insulin or forgets to do it  Blood sugars are as high as 396 after dinner  About a week ago she was having a couple of good readings in the evenings but most likely these are before dinner  Difficult to know which of her blood sugars are after meals  No recent labs available  Her weight appears to be about the same  She thinks she is taking her Trulicity regularly        Side  effects from medications have been: None known     Meal times are:  Breakfast is at variable times between 8 AM-9  Dinner 7 pm  Typical meal intake: Breakfast is Cheerios or raisin Bran               Exercise:  Unable to do any  Glucose monitoring:  done?  1-2 times a day         Glucometer: One Probation officer.       Blood Glucose readings by download of meter   PRE-MEAL Fasting Lunch Dinner Bedtime Overall  Glucose range:  66-278  150-295  121-299  224-396   Mean/median:  100   220  255  183    Previous blood sugars:  PRE-MEAL Fasting Lunch Dinner Bedtime Overall  Glucose range: 80-110 ?  89-130 80-279  Mean/median:        POST-MEAL PC Breakfast PC Lunch PC Dinner  Glucose range:  279   Mean/median:          Dietician visit, most recent: 11/2018  Weight history:  Wt Readings from Last 3 Encounters:  03/14/19 151 lb 3.2 oz (68.6 kg)  03/03/19 153 lb 14.4 oz (69.8 kg)  01/02/19 151 lb 4.8 oz (68.6 kg)  Glycemic control:   Lab Results  Component Value Date   HGBA1C 6.9 (A) 01/30/2019   HGBA1C 8.7 (A) 08/08/2018   HGBA1C 5.9 (A) 07/15/2017   Lab Results  Component Value Date   MICROALBUR 1,289.4 (H) 03/07/2019   LDLCALC 57 01/30/2019   CREATININE 1.14 01/30/2019   Lab Results  Component Value Date   MICRALBCREAT 5 03/28/2018    No results found for: FRUCTOSAMINE  No visits with results within 1 Week(s) from this visit.  Latest known visit with results is:  Appointment on 03/03/2019  Component Date Value Ref Range Status  . Ferritin 03/03/2019 174  11 - 307 ng/mL Final   Performed at [facility], [place] [address] [place], [place] [address]  . WBC Count 03/03/2019 6.4  4.0 - 10.5 K/uL Final  . RBC 03/03/2019 4.66  3.87 - 5.11 MIL/uL Final  . Hemoglobin 03/03/2019 14.1  12.0 - 15.0 g/dL Final  . HCT 03/03/2019 41.7  36.0 - 46.0 % Final  . MCV 03/03/2019 89.5  80.0 - 100.0 fL Final  . MCH 03/03/2019 30.3  26.0 - 34.0 pg Final   . MCHC 03/03/2019 33.8  30.0 - 36.0 g/dL Final  . RDW 03/03/2019 13.9  11.5 - 15.5 % Final  . Platelet Count 03/03/2019 143* 150 - 400 K/uL Final  . nRBC 03/03/2019 0.0  0.0 - 0.2 % Final  . Neutrophils Relative % 03/03/2019 68  % Final  . Neutro Abs 03/03/2019 4.4  1.7 - 7.7 K/uL Final  . Lymphocytes Relative 03/03/2019 11  % Final  . Lymphs Abs 03/03/2019 0.7  0.7 - 4.0 K/uL Final  . Monocytes Relative 03/03/2019 10  % Final  . Monocytes Absolute 03/03/2019 0.6  0.1 - 1.0 K/uL Final  . Eosinophils Relative 03/03/2019 10  % Final  . Eosinophils Absolute 03/03/2019 0.6* 0.0 - 0.5 K/uL Final  . Basophils Relative 03/03/2019 1  % Final  . Basophils Absolute 03/03/2019 0.1  0.0 - 0.1 K/uL Final  . Immature Granulocytes 03/03/2019 0  % Final  . Abs Immature Granulocytes 03/03/2019 0.02  0.00 - 0.07 K/uL Final   Performed at [facility], [place] [address] [place], [place] [address]  . Protein, ur 03/03/2019 300* NEGATIVE mg/dL Final   Performed at [facility], [place] [address] [place], [place] [address]  . Microalb, Ur 03/07/2019 1,289.4* Not Estab. ug/mL Final   Comment: (NOTE) Results confirmed on dilution.   Jacquelyne Balint, 24H Ur 03/07/2019 2,128* 0 - 29 mg/day Final   Comment: (NOTE)                       Normal:                0 -  29                       Moderately increased: 30 - 300                       Severely increased:       >300 Performed At: [facility] [place], [place] [id number] Rush Farmer MD [id number]   . Total Volume 03/07/2019 1,650   Final   Performed at [facility], [place] [address], [place], [place] [address]  . Urine Total Volume-UPROT 03/07/2019 1,650  mL Final   Performed at [name], [place] [address] [place], [place] [address]  . Collection Interval-UPROT 03/07/2019 24  hours Final   Performed at [facility]atory, [place] [address] [place], [place] [address]  . Protein, Urine 03/07/2019 166  mg/dL Final   RESULTS CONFIRMED BY MANUAL DILUTION  . Protein, 24H Urine 03/07/2019 2,739* 50 - 100 mg/day Final   Performed at [facility], [place] [address], [place], [place] [address]    Allergies as of 03/14/2019      Reactions   Aspirin Nausea And Vomiting      Medication List       Accurate as of March 14, 2019 11:59 PM. If you have any questions, ask your nurse or doctor.        STOP taking these medications   insulin lispro 100 UNIT/ML KwikPen Commonly known as: HumaLOG KwikPen Stopped by: Elayne Snare, MD     TAKE these medications   amLODipine 5 MG tablet Commonly known as: NORVASC Take 1 tablet (5 mg total) by mouth daily.   b complex vitamins capsule Take 1 capsule by mouth daily.   B-D UF III MINI PEN NEEDLES 31G X 5 MM Misc Generic drug: Insulin Pen Needle USE TO INJECT INSULIN  3 TIMES DAILY   calcium-vitamin D 500-200 MG-UNIT tablet Commonly known as: OSCAL WITH D Take 1 tablet by mouth 2 (two) times daily.   cetaphil lotion Apply 1 application topically as needed for dry skin.   donepezil 10 MG tablet Commonly known as: ARICEPT Take 1 tablet (10 mg total) by mouth at bedtime.   Ferrocite 324 (106 Fe) MG Tabs tablet Generic drug: Ferrous Fumarate Take 1 tablet by mouth twice daily   furosemide 20 MG tablet Commonly known as: LASIX Take 1 tablet (20 mg total) by mouth daily.   lactulose 10 GM/15ML solution Commonly known as: CHRONULAC TAKE 15 ML BY MOUTH TWO TIMES DAILY   levETIRAcetam 500 MG tablet Commonly known as: KEPPRA Take 1 tablet (500 mg total) by mouth 2 (two) times daily.   Medical Compression Stockings Misc Wear as much as possible while awake to reduce swelling   memantine 10 MG tablet Commonly known as: NAMENDA Take 1 tablet (10 mg total) by mouth 2 (two) times daily.   OneTouch Delica Lancets Fine Misc Check  blood sugar up to 4 times a day   OneTouch Verio test strip Generic drug: glucose blood Use as instructed 4 times daily. E11.29, insulin requiring   OneTouch Verio w/Device Kit Use to check blood sugar up to 4 times a day   oxycodone-acetaminophen 2.5-325 MG tablet Commonly known as: PERCOCET Take 1 tablet by mouth at bedtime as needed for pain.   oxymetazoline 0.05 % nasal spray Commonly known as: AFRIN Place 1 spray into both nostrils 2 (two) times daily as needed (Epistaxis).   pantoprazole 40 MG tablet Commonly known as: PROTONIX Take 1 tablet by mouth once daily   sodium chloride 0.65 % Soln nasal spray Commonly known as: OCEAN Place 1 spray into both nostrils as needed for congestion.   Tyler Aas FlexTouch 100 UNIT/ML Sopn FlexTouch Pen Generic drug: insulin degludec INJECT 21 UNITS SUBCUTANEOUSLY IN THE EVENING   triamcinolone ointment 0.5 % Commonly known as: KENALOG APPLY 1 APPLICATION TOPICALLY TO RASH TWICE DAILY FOR ITCHING What changed:   how much to take  how to take this  when to take this  reasons to take this  additional instructions  Trulicity 1.5 FI/4.3PI Sopn Generic drug: Dulaglutide INJECT 1.5 MG INTO THE SKIN ONCE A WEEK EVERY TUESDAY       Allergies:  Allergies  Allergen Reactions  . Aspirin Nausea And Vomiting    Past Medical History:  Diagnosis Date  . Chronic anemia   . Chronic diastolic CHF (congestive heart failure) ([place]) 10/03/2013  . Chronic GI bleeding    [name] 11/29/2014  . Family history of anesthesia complication    "niece has a hard time coming out" (09/15/2012)  . Frequent nosebleeds    chronic  . Gastric AV malformation    [name] 11/29/2014  . GERD (gastroesophageal reflux disease)   . 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 History of blood transfusion "several"  . History of epistaxis   . HTN (hypertension), benign 03/02/2012  . Hyperlipidemia   . Iron deficiency anemia    chronic  infusions"  . Lichen planus    Both lower extremities  . 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 Overgrown toenails 03/18/2017  . Pneumonia 1990's X 2  . Pulmonary HTN ([place]) 04/01/2017   PASP 40mHg on echo 09/2016 and 434mg by echo 2019  . [place] ([place]  . Symptomatic anemia 11/29/2014  . Telangiectasia    Gastric   . 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    Past Surgical History:  Procedure Laterality Date  . CATARACT EXTRACTION     "I think it was just one eye"  . ESOPHAGOGASTRODUODENOSCOPY  02/26/2011   Procedure: ESOPHAGOGASTRODUODENOSCOPY (EGD);  Surgeon: [name];  [facility];  Service: Endoscopy;  Laterality: N/A;  . ESOPHAGOGASTRODUODENOSCOPY N/A 11/08/2012   Procedure: ESOPHAGOGASTRODUODENOSCOPY (EGD);  Surgeon: [name];  [facility];  Service: Endoscopy;  Laterality: N/A;  . ESOPHAGOGASTRODUODENOSCOPY N/A 10/04/2013   Procedure: ESOPHAGOGASTRODUODENOSCOPY (EGD);  Surgeon: [name];  [facility];  Service: Endoscopy;  Laterality: N/A;  with APC on stand-by  . ESOPHAGOGASTRODUODENOSCOPY N/A 07/06/2014   Procedure: ESOPHAGOGASTRODUODENOSCOPY (EGD);  Surgeon: [name];  [facility];  Service: Endoscopy;  Laterality: N/A;  . ESOPHAGOGASTRODUODENOSCOPY N/A 09/05/2014   Procedure: ESOPHAGOGASTRODUODENOSCOPY (EGD);  Surgeon: JaLaurence SpatesMD;  [facility];  Service: Endoscopy;  Laterality: N/A;  APC on standby to control bleeding  . ESOPHAGOGASTRODUODENOSCOPY N/A 11/29/2014   Procedure: ESOPHAGOGASTRODUODENOSCOPY (EGD);  Surgeon: [name];  [facility];  Service: Endoscopy;  Laterality: N/A;  . ESOPHAGOGASTRODUODENOSCOPY N/A 09/28/2015   Procedure: ESOPHAGOGASTRODUODENOSCOPY (EGD);  Surgeon: [name];  [facility];  Service: Endoscopy;  Laterality: N/A;  . ESOPHAGOGASTRODUODENOSCOPY (EGD) WITH PROPOFOL N/A  12/04/2017   Procedure: ESOPHAGOGASTRODUODENOSCOPY (EGD) WITH PROPOFOL;  Surgeon: [name];  [facility]: [facility];  Laterality: N/A;  . ESOPHAGOGASTRODUODENOSCOPY ENDOSCOPY  08/19/2006   with laser treatment  . HOT HEMOSTASIS N/A 07/06/2014   Procedure: HOT HEMOSTASIS (ARGON PLASMA COAGULATION/BICAP);  Surgeon: [name];  [facility];  Service: Endoscopy;  Laterality: N/A;  . HOT HEMOSTASIS N/A 09/28/2015   Procedure: HOT HEMOSTASIS (ARGON PLASMA COAGULATION/BICAP);  Surgeon: [name];  [facility];  Service: Endoscopy;  Laterality: N/A;  . HOT HEMOSTASIS N/A 12/04/2017   Procedure: HOT HEMOSTASIS (ARGON PLASMA COAGULATION/BICAP);  Surgeon: [name];  [place] Service: Endoscopy;  Laterality: N/A;  . NASAL HEMORRHAGE CONTROL     "for bleeding"   . SAVORY DILATION  02/26/2011   Procedure: SAVORY DILATION;  Surgeon: [name], MD;  [name];  Service: Endoscopy;  Laterality: N/A;  c-arm needed  . SUBMUCOSAL INJECTION  12/04/2017   Procedure: SUBMUCOSAL INJECTION;  Surgeon: [name], MD;  Location: [facility] END[facility]COPY;  Service: Endoscopy;;    Family History  Problem Relation Age of Onset  . Stroke Father   . Healthy Mother   . Breast cancer Other   . Diabetes Niece   . Malignant hyperthermia Neg Hx   . Seizures Neg Hx     Social History:  reports that she quit smoking about 48 years ago. Her smoking use included cigarettes. She has a 20.00 pack-year smoking history. She has never used smokeless tobacco. She reports that she does not drink alcohol or use drugs.   Review of Systems  Hematology: She has had history of iron deficiency anemia requiring infusions every 1 to 2 months  Lab Results  Component Value Date   HGB 14.1 03/03/2019     Lipid history: No increase in LDL as of the last labs:    Lab Results  Component Value Date   CHOL 133 01/30/2019   HDL  41.30 01/30/2019   LDLCALC 57 01/30/2019   TRIG 176.0 (H) 01/30/2019   CHOLHDL 3 01/30/2019           Hypertension: Now taking amlodipine from her PCP  BP Readings from Last 3 Encounters:  03/14/19 140/60  03/03/19 (!) 155/54  01/30/19 (!) 140/58    Most recent eye exam was in 07/2018  Most recent foot exam: 06/2018 by podiatrist with no evidence of sensory loss or reduction in sensation   Complications of diabetes: Reportedly has retinopathy, currently without a foot exam not clear if she has neuropathy.  No history of microalbuminuria  LABS:  No visits with results within 1 Week(s) from this visit.  Latest known visit with results is:  Appointment on 03/03/2019  Component Date Value Ref Range Status  . Ferritin 03/03/2019 174  11 - 307 ng/mL Final   Performed at [facility], [place] [address], [place], [place] [address]  . WBC Count 03/03/2019 6.4  4.0 - 10.5 K/uL Final  . RBC 03/03/2019 4.66  3.87 - 5.11 MIL/uL Final  . Hemoglobin 03/03/2019 14.1  12.0 - 15.0 g/dL Final  . HCT 03/03/2019 41.7  36.0 - 46.0 % Final  . MCV 03/03/2019 89.5  80.0 - 100.0 fL Final  . MCH 03/03/2019 30.3  26.0 - 34.0 pg Final  . MCHC 03/03/2019 33.8  30.0 - 36.0 g/dL Final  . RDW 03/03/2019 13.9  11.5 - 15.5 % Final  . Platelet Count 03/03/2019 143* 150 - 400 K/uL Final  . nRBC 03/03/2019 0.0  0.0 - 0.2 % Final  . Neutrophils Relative % 03/03/2019 68  % Final  . Neutro Abs 03/03/2019 4.4  1.7 - 7.7 K/uL Final  . Lymphocytes Relative 03/03/2019 11  % Final  . Lymphs Abs 03/03/2019 0.7  0.7 - 4.0 K/uL Final  . Monocytes Relative 03/03/2019 10  % Final  . Monocytes Absolute 03/03/2019 0.6  0.1 - 1.0 K/uL Final  . Eosinophils Relative 03/03/2019 10  % Final  . Eosinophils Absolute 03/03/2019 0.6* 0.0 - 0.5 K/uL Final  . Basophils Relative 03/03/2019 1  % Final  . Basophils Absolute 03/03/2019 0.1  0.0 - 0.1 K/uL Final  . Immature Granulocytes 03/03/2019 0  % Final  . Abs  Immature Granulocytes 03/03/2019 0.02  0.00 - 0.07 K/uL Final   Performed at [facility], [place] [address] [place], [place] [address]  . Protein, ur 03/03/2019 300* NEGATIVE mg/dL Final   Performed at [facility], [place] [address], [place], [place] [address]  . Microalb, Ur 03/07/2019 1,289.4* Not Estab. ug/mL Final   Comment: (NOTE) Results confirmed on dilution.   Jacquelyne Balint, 24H Ur 03/07/2019 2,128* 0 - 29 mg/day Final   Comment: (NOTE)                       Normal:                0 -  29                       Moderately increased: 30 - 300                       Severely increased:       >300 Performed At: [facility] [place], [place] [id number] Rush Farmer MD [id number]   . Total Volume 03/07/2019 1,650   Final   Performed at [facility], [place] [address] [place], [place] [address]  . Urine Total Volume-UPROT 03/07/2019 1,650  mL Final   Performed at [facility], [place] [address] [place], [place] [address]  . Collection Interval-UPROT 03/07/2019 24  hours Final   Performed at [facility], [place] [address] [place], [place] [address]  . Protein, Urine 03/07/2019 166  mg/dL Final   RESULTS CONFIRMED BY MANUAL DILUTION  . Protein, 24H Urine 03/07/2019 2,739* 50 - 100 mg/day Final   Performed at [facility], [place] [address] [place], [place] [address]    Physical Examination:  BP 140/60 (BP Location: Left Arm, Patient Position: Sitting, Cuff Size: Normal)   Pulse (!) 53   Ht 5' 3" (1.6 m)   Wt 151 lb 3.2 oz (68.6 kg)   LMP  (LMP Unknown)   SpO2 95%   BMI 26.78 kg/m    ASSESSMENT:  Diabetes type 2 on insulin  See history of present illness for detailed discussion of current diabetes management, blood sugar patterns and problems identified  Her last A1c was  6.9  She is on Trulicity and has been  recommended a regimen of Humalog with her Tyler Aas  She now appears to be very irregular with her Humalog Not clear if this is a problem with her forgetting her mealtime insulin but she likely also does not take any mealtime insulin since her morning sugars are relatively better Even with reducing her Tyler Aas slightly high fasting readings are averaging only 100 with only occasional hypoglycemia however Blood sugars have been as high as 396 postprandially This is not always related to diet She will still sometimes eat cereal or other high carbohydrate meals  Today discussed in detail the need for mealtime insulin to cover postprandial spikes, action of mealtime insulin,  timing and action of the rapid acting insulin as well as dosage regimen Postprandial readings are not improved by using Trulicity  Discussed that she needs to take mealtime insulin even when the blood sugars may be normal before starting to eat She will need to also reduce her [place]   HYPERTENSION: Appears better controlled now  PLAN:     As above  Also for simplicity she will take  5 units of Humalog consistently before breakfast and lunch and 6 units at suppertime compared to previous recommended dose of 4 units  Her husband will remind her to take insulin anytime she starts eating a meal  Encourage her to have a protein such as eggs or lean meat at breakfast daily  Reduce Tresiba down to 16 units  May not necessarily need to check sugars every morning fasting  To call if she has any questions or concerns about blood sugar levels her insulin dose  Also if she continues to have any low sugars overnight she will need to let us know  For now continue Trulicity but not clear if this is benefiting her and may consider stopping   Patient Instructions  HUMALOG BEFORE EVERY MEAL regardless of sugar level   Take 5 units at Bfst and lunch and 6 units at supper  Tresiba 16 units daily         Elayne Snare  03/15/2019, 11:12 AM   Note: This office note was prepared with Dragon voice recognition system technology. Any transcriptional errors that result from this process are unintentional.

## 2019-03-17 ENCOUNTER — Other Ambulatory Visit: Payer: Self-pay | Admitting: Internal Medicine

## 2019-03-17 NOTE — Telephone Encounter (Signed)
REFILL REQUEST  oxycodone-acetaminophen (PERCOCET) 2.5-325 MG tablet  [place], [place] [address] [phone] (Phone) [phone] (Fax)

## 2019-03-20 MED ORDER — OXYCODONE-ACETAMINOPHEN 2.5-325 MG PO TABS: 1.0000 | ORAL_TABLET | Freq: Every evening | ORAL | 0 refills | Status: DC | PRN

## 2019-03-21 ENCOUNTER — Telehealth: Payer: Self-pay | Admitting: *Deleted

## 2019-03-21 NOTE — Telephone Encounter (Addendum)
Information was sent through CoverMyMeds for PA for Oxycodone Acetaminophen.  Awaiting decision.  Sander Nephew, RN 03/21/2019 2:01 PM. Message from CoverMyMeds.  No PA needed.  Call to [facility] at [phone]. Issue resolved.  Sander Nephew, RN 03/22/2019.  10:20 AM.

## 2019-03-23 ENCOUNTER — Telehealth: Payer: Self-pay

## 2019-03-23 NOTE — Telephone Encounter (Signed)
Requesting to speak with a nurse about meds. Please call pt back.  

## 2019-03-23 NOTE — Telephone Encounter (Signed)
Pt states she might be having a problem with her medicine or might need some new medicine. States she has problems at night being restless and confused, keeps her husband up looking after her, she is encouraged to talk to dr bloomfield about this Monday at her appt, she and her husband are agreeable appt 2/15 at 1415

## 2019-03-27 ENCOUNTER — Other Ambulatory Visit: Payer: Self-pay

## 2019-03-27 ENCOUNTER — Ambulatory Visit (INDEPENDENT_AMBULATORY_CARE_PROVIDER_SITE_OTHER): Payer: Medicare Other | Admitting: Internal Medicine

## 2019-03-27 DIAGNOSIS — I5032 Chronic diastolic (congestive) heart failure: Secondary | ICD-10-CM | POA: Diagnosis not present

## 2019-03-27 DIAGNOSIS — I739 Peripheral vascular disease, unspecified: Secondary | ICD-10-CM | POA: Diagnosis not present

## 2019-03-27 DIAGNOSIS — R011 Cardiac murmur, unspecified: Secondary | ICD-10-CM | POA: Diagnosis not present

## 2019-03-27 DIAGNOSIS — IMO0002 Reserved for concepts with insufficient information to code with codable children: Secondary | ICD-10-CM

## 2019-03-27 DIAGNOSIS — I110 Hypertensive heart disease with heart failure: Secondary | ICD-10-CM | POA: Diagnosis not present

## 2019-03-27 DIAGNOSIS — E1121 Type 2 diabetes mellitus with diabetic nephropathy: Secondary | ICD-10-CM

## 2019-03-27 DIAGNOSIS — Z79891 Long term (current) use of opiate analgesic: Secondary | ICD-10-CM

## 2019-03-27 DIAGNOSIS — I10 Essential (primary) hypertension: Secondary | ICD-10-CM

## 2019-03-27 DIAGNOSIS — Z79899 Other long term (current) drug therapy: Secondary | ICD-10-CM

## 2019-03-27 MED ORDER — ONETOUCH VERIO VI STRP: ORAL_STRIP | 3 refills | Status: DC

## 2019-03-27 MED ORDER — AMLODIPINE BESYLATE 10 MG PO TABS: 10.0000 mg | ORAL_TABLET | Freq: Every day | ORAL | 2 refills | Status: DC

## 2019-03-27 NOTE — Patient Instructions (Signed)
Ms. Niesen,  I'm glad you are doing well.   Today we discussed your blood pressure: I am increasing your amlodipine to 10 mg to improve blood pressure control.  In terms of vitamins: I would continue take calcium/Vitamin D as well as a general multivitamin  For muscle cramps at night: be sure to stay hydrated. You can try some calf stretches or pumping your legs several times prior to bedtime. You may get relief with mustard packets if you continue to have issues.   Take care, and I'll see you again in 6 months.   Dr. Koleen Distance

## 2019-03-28 ENCOUNTER — Encounter: Payer: Self-pay | Admitting: Internal Medicine

## 2019-03-28 NOTE — Assessment & Plan Note (Signed)
This problem is chronic and stable. Continue Lasix 20 mg daily.

## 2019-03-28 NOTE — Assessment & Plan Note (Signed)
Ms. Gusman states she does not walk much anymore due to leg pain. Low-dose Percocet keeps pain manageable at night to allow her to sleep better. No sedating side effects or falls. Continue current therapy.

## 2019-03-28 NOTE — Assessment & Plan Note (Signed)
Patient's blood pressure is elevated today. She is asymptomatic. Husband notes systolics have been in the 150s-160s at home. Will increase Amlodipine to 10 mg to try and get better control. May end up needing addition of low-dose ACE or ARB if not adequately controlled.

## 2019-03-28 NOTE — Progress Notes (Signed)
   CC: HTN  HPI:  [name] is a 79 y.o. female with PMHx listed below who presents for follow-up on HTN. Please see encounters tab for updates on her chronic medical conditions.   Past Medical History:  Diagnosis Date  . Chronic anemia   . Chronic diastolic CHF (congestive heart failure) ([place]) 10/03/2013  . Chronic GI bleeding    [name] 11/29/2014  . Family history of anesthesia complication    "niece has a hard time coming out" (09/15/2012)  . Frequent nosebleeds    chronic  . Gastric AV malformation    [name] 11/29/2014  . GERD (gastroesophageal reflux disease)   . 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 History of blood transfusion "several"  . History of epistaxis   . HTN (hypertension), benign 03/02/2012  . Hyperlipidemia   . Iron deficiency anemia    chronic infusions"  . Lichen planus    Both lower extremities  . 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 Overgrown toenails 03/18/2017  . Pneumonia 1990's X 2  . Pulmonary HTN ([place]) 04/01/2017   PASP 35mmHg on echo 09/2016 and 89mmHg by echo 2019  . Seizures ([place]) 09/2014  . Symptomatic anemia 11/29/2014  . Telangiectasia    Gastric   . 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   Review of Systems:   Review of Systems  Constitutional: Negative for chills and fever.  HENT: Negative for congestion, sinus pain and sore throat.   Eyes: Negative for blurred vision.  Respiratory: Negative for cough and shortness of breath.   Cardiovascular: Negative for chest pain and palpitations.  Gastrointestinal: Negative for blood in stool and melena.  Genitourinary: Negative for hematuria.  Musculoskeletal: Negative for falls.  Neurological: Negative for focal weakness, loss of consciousness and headaches.   Physical Exam:  Vitals:   03/27/19 1451  BP: (!) 170/52  Pulse: (!) 56  Temp: 98.4 F (36.9 C)  TempSrc: Oral  SpO2: 98%  Weight: 147 lb 12.8 oz (67 kg)   Physical  Exam Constitutional:      General: She is not in acute distress.    Appearance: Normal appearance.  Eyes:     Conjunctiva/sclera: Conjunctivae normal.  Cardiovascular:     Rate and Rhythm: Normal rate and regular rhythm.     Heart sounds: Murmur present.  Pulmonary:     Effort: Pulmonary effort is normal.     Breath sounds: Normal breath sounds.  Abdominal:     General: There is no distension.     Palpations: Abdomen is soft.     Tenderness: There is no abdominal tenderness.  Musculoskeletal:     Cervical back: Neck supple.     Right lower leg: Edema present.     Left lower leg: Edema present.  Lymphadenopathy:     Cervical: No cervical adenopathy.  Skin:    General: Skin is warm and dry.  Neurological:     General: No focal deficit present.     Mental Status: She is alert. Mental status is at baseline.  Psychiatric:        Mood and Affect: Mood normal.        Behavior: Behavior normal.     Assessment & Plan:   See Encounters Tab for problem based charting.  Patient discussed with Dr. Philipp Ovens

## 2019-03-29 NOTE — Progress Notes (Signed)
[place]   [phone] [phone]   Clinic Follow up Note   Patient Care Team: Delice Bison, DO as PCP - General [name], MD as PCP - Cardiology (Cardiology) Truitt Merle, MD as Consulting Physician (Hematology) Elayne Snare, MD as Consulting Physician (Endocrinology)  Date of Service:  03/31/2019  CHIEF COMPLAINT:  F/u ofAnemia due to chronic blood loss, HHT  HEMATOLOGY HISTORY 1.Hereditary hemorrhagic telangiectasia (HHT),with epistaxis andfrequentGI bleeding. Previously under care of Dr. [name] 2.Anemia secondary to GI bleeding and iron deficiency, required blood transfusion every 3 to 4 weeks, IV Feraheme every 1 to 2 months  CURRENT THERAPY 1. Iv feraheme as needed if ferritin<50, latest on 09/12/18 2.Avastin34m/kg every 2 weeks, starting3/30/20. Reduced to every 4 weeks starting 11/07/18.Reduced to every 2 months starting 01/02/19. Held since 03/09/19 due to high proteinuria. 3. Blood transfusion if Hg<8.0  INTERVAL HISTORY:  BStarlet GallentineLittle is here for a follow up of HHT. She presents to the clinic alone. Her husband was called to be included in visit. She notes her BG has been high lately even up to 400 and not sure why. Her husband notes her BG dropped to 562yesterday. She notes her appetite is low but no change in her diet. She notes this is managed by Dr. KDwyane Deeand she saw him a few weeks ago. She requests referral to Dentist and eye doctor. She notes she has united [employer] care with no dental insurance. She notes she has not seen dentist in a while but notes black spot on one of her teeth.    REVIEW OF SYSTEMS:   Constitutional: Denies fevers, chills or abnormal weight loss Eyes: Denies blurriness of vision Ears, nose, mouth, throat, and face: Denies mucositis or sore throat Respiratory: Denies cough, dyspnea or wheezes Cardiovascular: Denies palpitation, chest discomfort or lower extremity  swelling Gastrointestinal:  Denies nausea, heartburn or change in bowel habits Skin: Denies abnormal skin rashes Lymphatics: Denies new lymphadenopathy or easy bruising Neurological:Denies numbness, tingling or new weaknesses Behavioral/Psych: Mood is stable, no new changes  All other systems were reviewed with the patient and are negative.  MEDICAL HISTORY:  Past Medical History:  Diagnosis Date  . Chronic anemia   . Chronic diastolic CHF (congestive heart failure) ([place] 10/03/2013  . Chronic GI bleeding    /[name]10/20/2016  . Family history of anesthesia complication    "niece has a hard time coming out" (09/15/2012)  . Frequent nosebleeds    chronic  . Gastric AV malformation    /[name]10/20/2016  . GERD (gastroesophageal reflux disease)   . 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 History of blood transfusion "several"  . History of epistaxis   . HTN (hypertension), benign 03/02/2012  . Hyperlipidemia   . Iron deficiency anemia    chronic infusions"  . Lichen planus    Both lower extremities  . 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    /[name]10/20/2016  . Overgrown toenails 03/18/2017  . Pneumonia 1990's X 2  . Pulmonary HTN ([place] 04/01/2017   PASP 575mg on echo 09/2016 and 4520m by echo 2019  . Seizures ([place]/2016  . Symptomatic anemia 11/29/2014  . Telangiectasia    Gastric   . 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    SURGICAL HISTORY: Past Surgical History:  Procedure Laterality Date  . CATARACT EXTRACTION     "I think it was just one eye"  . ESOPHAGOGASTRODUODENOSCOPY  02/26/2011   Procedure: ESOPHAGOGASTRODUODENOSCOPY (EGD);  Surgeon: [name], MD;  Location: [name];  Service: Endoscopy;  Laterality: N/A;  . ESOPHAGOGASTRODUODENOSCOPY N/A 11/08/2012   Procedure: ESOPHAGOGASTRODUODENOSCOPY (EGD);  Surgeon: [name], MD;  Location: [name];  Service: Endoscopy;  Laterality: N/A;  . ESOPHAGOGASTRODUODENOSCOPY N/A  10/04/2013   Procedure: ESOPHAGOGASTRODUODENOSCOPY (EGD);  Surgeon: Winfield Cunas., MD;  Location: [name];  Service: Endoscopy;  Laterality: N/A;  with APC on stand-by  . ESOPHAGOGASTRODUODENOSCOPY N/A 07/06/2014   Procedure: ESOPHAGOGASTRODUODENOSCOPY (EGD);  Surgeon: Clarene Essex, MD;  Location: [name];  Service: Endoscopy;  Laterality: N/A;  . ESOPHAGOGASTRODUODENOSCOPY N/A 09/05/2014   Procedure: ESOPHAGOGASTRODUODENOSCOPY (EGD);  Surgeon: Laurence Spates, MD;  Location: [name];  Service: Endoscopy;  Laterality: N/A;  APC on standby to control bleeding  . ESOPHAGOGASTRODUODENOSCOPY N/A 11/29/2014   Procedure: ESOPHAGOGASTRODUODENOSCOPY (EGD);  Surgeon: [name], MD;  Location[facility]COPY;  Service: Endoscopy;  Laterality: N/A;  . ESOPHAGOGASTRODUODENOSCOPY N/A 09/28/2015   Procedure: ESOPHAGOGASTRODUODENOSCOPY (EGD);  Surgeon: Clarene Essex, MD;  Location[facility] ENDOSCOPY;  Service: Endoscopy;  Laterality: N/A;  . ESOPHAGOGASTRODUODENOSCOPY (EGD) WITH PROPOFOL N/A 12/04/2017   Procedure: ESOPHAGOGASTRODUODENOSCOPY (EGD) WITH PROPOFOL;  Surgeon: [name], MD;  Location: [facility];  Service: Endoscopy;  Laterality: N/A;  . ESOPHAGOGASTRODUODENOSCOPY ENDOSCOPY  08/19/2006   with laser treatment  . HOT HEMOSTASIS N/A 07/06/2014   Procedure: HOT HEMOSTASIS (ARGON PLASMA COAGULATION/BICAP);  Surgeon: Clarene Essex, MD;  Location: [name];  Service: Endoscopy;  Laterality: N/A;  . HOT HEMOSTASIS N/A 09/28/2015   Procedure: HOT HEMOSTASIS (ARGON PLASMA COAGULATION/BICAP);  Surgeon: Clarene Essex, MD;  Location: [facility] ENDOSCOPY;  Service: Endoscopy;  Laterality: N/A;  . HOT HEMOSTASIS N/A 12/04/2017   Procedure: HOT HEMOSTASIS (ARGON PLASMA COAGULATION/BICAP);  Surgeon: [name], MD;  Location: [facility];  Service: Endoscopy;  Laterality: N/A;  . NASAL HEMORRHAGE CONTROL     "for bleeding"   . SAVORY DILATION  02/26/2011   Procedure: SAVORY DILATION;  Surgeon:  [name], MD;  Location: [name];  Service: Endoscopy;  Laterality: N/A;  c-arm needed  . SUBMUCOSAL INJECTION  12/04/2017   Procedure: SUBMUCOSAL INJECTION;  Surgeon: [name], MD;  Location: [facility] ENDOSCOPY;  Service: Endoscopy;;    I have reviewed the social history and family history with the patient and they are unchanged from previous note.  ALLERGIES:  is allergic to aspirin.  MEDICATIONS:  Current Outpatient Medications  Medication Sig Dispense Refill  . amLODipine (NORVASC) 10 MG tablet Take 1 tablet (10 mg total) by mouth daily. 30 tablet 2  . b complex vitamins capsule Take 1 capsule by mouth daily. 100 capsule 2  . Blood Glucose Monitoring Suppl (ONETOUCH VERIO) w/Device KIT Use to check blood sugar up to 4 times a day 1 kit 1  . calcium-vitamin D (OSCAL WITH D) 500-200 MG-UNIT tablet Take 1 tablet by mouth 2 (two) times daily.    . cetaphil (CETAPHIL) lotion Apply 1 application topically as needed for dry skin. 236 mL 0  . donepezil (ARICEPT) 10 MG tablet Take 1 tablet (10 mg total) by mouth at bedtime. 90 tablet 4  . Dulaglutide (TRULICITY) 1.5 ZO/1.0RU SOPN INJECT 1.5 MG INTO THE SKIN ONCE A WEEK EVERY TUESDAY 12 mL 0  . Elastic Bandages & Supports (MEDICAL COMPRESSION STOCKINGS) MISC Wear as much as possible while awake to reduce swelling 2 each 0  . FERROCITE 324 MG TABS tablet Take 1 tablet by mouth twice daily 60 tablet 5  .  furosemide (LASIX) 20 MG tablet Take 1 tablet (20 mg total) by mouth daily. 90 tablet 3  . glucose blood (ONETOUCH VERIO) test strip Use as instructed 4 times daily. E11.29, insulin requiring 350 each 3  . Insulin Pen Needle (B-D UF III MINI PEN NEEDLES) 31G X 5 MM MISC USE TO INJECT INSULIN  3 TIMES DAILY 100 each 0  . lactulose (CHRONULAC) 10 GM/15ML solution TAKE 15 ML BY MOUTH TWO TIMES DAILY 473 mL 2  . levETIRAcetam (KEPPRA) 500 MG tablet Take 1 tablet (500 mg total) by mouth 2 (two) times daily. 180 tablet 4  . memantine  (NAMENDA) 10 MG tablet Take 1 tablet (10 mg total) by mouth 2 (two) times daily. 180 tablet 4  . ONETOUCH DELICA LANCETS FINE MISC Check blood sugar up to 4 times a day 300 each 4  . oxycodone-acetaminophen (PERCOCET) 2.5-325 MG tablet Take 1 tablet by mouth at bedtime as needed for pain. 30 tablet 0  . oxymetazoline (AFRIN) 0.05 % nasal spray Place 1 spray into both nostrils 2 (two) times daily as needed (Epistaxis). 30 mL 0  . pantoprazole (PROTONIX) 40 MG tablet Take 1 tablet by mouth once daily 180 tablet 0  . sodium chloride (OCEAN) 0.65 % SOLN nasal spray Place 1 spray into both nostrils as needed for congestion. 60 mL 0  . TRESIBA FLEXTOUCH 100 UNIT/ML SOPN FlexTouch Pen INJECT 21 UNITS SUBCUTANEOUSLY IN THE EVENING 15 mL 0  . triamcinolone ointment (KENALOG) 0.5 % APPLY 1 APPLICATION TOPICALLY TO RASH TWICE DAILY FOR ITCHING (Patient taking differently: Apply 1 application topically 2 (two) times daily as needed (itching). ) 15 g 1   No current facility-administered medications for this visit.    PHYSICAL EXAMINATION: ECOG PERFORMANCE STATUS: 2 - Symptomatic, <50% confined to bed  Vitals:   03/31/19 1040  BP: (!) 153/52  Pulse: 63  Resp: 17  Temp: 99.1 F (37.3 C)  SpO2: 96%   Filed Weights   03/31/19 1040  Weight: 151 lb (68.5 kg)    Due to COVID19 we will limit examination to appearance. Patient had no complaints.  GENERAL:alert, no distress and comfortable SKIN: skin color normal, no rashes or significant lesions EYES: normal, Conjunctiva are pink and non-injected, sclera clear  NEURO: alert & oriented x 3 with fluent speech   LABORATORY DATA:  I have reviewed the data as listed CBC Latest Ref Rng & Units 03/31/2019 03/03/2019 01/02/2019  WBC 4.0 - 10.5 K/uL 6.9 6.4 4.7  Hemoglobin 12.0 - 15.0 g/dL 14.1 14.1 13.3  Hematocrit 36.0 - 46.0 % 40.4 41.7 39.3  Platelets 150 - 400 K/uL 154 143(L) 121(L)     CMP Latest Ref Rng & Units 03/31/2019 01/30/2019 01/02/2019    Glucose 70 - 99 mg/dL 236(H) 296(H) 233(H)  BUN 8 - 23 mg/dL _0 Creatinine 0.44 - 1.00 mg/dL 1.02(H) 1.14 1.16(H)  Sodium 135 - 145 mmol/L 140 139 143  Potassium 3.5 - 5.1 mmol/L 4.7 5.0 4.5  Chloride 98 - 111 mmol/L 106 106 107  CO2 22 - 32 mmol/L _1 Calcium 8.9 - 10.3 mg/dL 9.2 9.2 10.1  Total Protein 6.5 - 8.1 g/dL 6.0(L) - 6.6  Total Bilirubin 0.3 - 1.2 mg/dL 0.6 - 0.8  Alkaline Phos 38 - 126 U/L 282(H) - 194(H)  AST 15 - 41 U/L 58(H) - 101(H)  ALT 0 - 44 U/L 65(H) - 96(H)      RADIOGRAPHIC STUDIES: I have personally  reviewed the radiological images as listed and agreed with the findings in the report. No results found.   ASSESSMENT & PLAN:  [name] is a 79 y.o. female with    1.Hereditary hemorrhagic telangiectasia(HHT)), withepistaxis and frequent GI bleeding -She has mild epistaxis, and frequent GI bleeding,she used torequire blood transfusion every 3 to 4 weeks. Her last blood transfusion was on 04/13/2018 -She started Avastin on 05/09/18.she responded very well, anemia resolved. I have changed it to every 2 months since Nov 2020.She responded well but has had elevated proteinuria. Her 24 urine showed protein at 2774m on 03/09/19. Will hold avastin for now.  -Labs reviewed, Her urine protein remains high 300. CBC normal, no recurrent anemia. Will continue to hold Avastin for now.  -continue IVFeraheme if ferritin<50andContinue oral iron. -F/u in3 months with monitor labs every 4 weeks   2. 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   3.Iron deficiency anemia  secondary to #2  -She does not tolerate oral ironwell but is on it -will continueIV Feraheme if ferritin less than 50.Last IV Feraheme8/3/20.  4.Dementia -She takes Ariceptand memantine -f/u with neurology. She has not been seen in some time. 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  5. DM,CHFand CKD stage III -She is on insulin, DM not well controlled. She is seen by Dr. KDwyane Dee-She will continue to monitorher blood glucose at home -CKD andCHF stable clinically.  -Her BG has fluctuating lately as high as 400. Her BG was low at 51 yesterday. BG at 236 today (03/31/19). She does not eat as much lately. I highly recommend she see Dr. KDwyane Deesoon for better management and refill of insulin.   6.Hypertension, secondary to Avastin -She has not been on HTN medication before.  -Per pt and her husbandSBP is elevated in 150-170 range at home.  -On 01/02/19 I started her on 538mamlodipine with mild improvement.  -continue monitoring   8. Worsening proteinuria -Likely related to Avastin, she has diabetes, which can cause proteinuria also -Her 24 urine showed protein at 2739 on 03/07/19. Will hold avastin.  -We will Reach Out her PCP and endocrinologist to see if ACE would be a good option for her.  -Urine protein today remains 300 (03/31/19), will continue to hold avastin for now.   Plan -Given high proteinuria and no anemia, continue to hold avastin.  -Per request, will refer her to Dentist Dr. KaDorothyann Gibbs -Lab and port flush every 4 week  -will set up iv feraheme if ferritin<50 -F/u in 12 weeks   No problem-specific Assessment & Plan notes found for this encounter.   Orders Placed This Encounter  Procedures  . Ambulatory referral to Dentistry    Referral Priority:   Routine    Referral Type:   Consultation    Referral Reason:   Specialty Services Required    Requested Specialty:   Dental General Practice    Number of Visits Requested:   1   All questions  were answered. The patient knows to call the clinic with any problems, questions or concerns. No barriers to learning was detected. The total time spent in the appointment  was 25 minutes.     Truitt Merle, MD 03/31/2019   I, Joslyn Devon, am acting as scribe for Truitt Merle, MD.   I have reviewed the above documentation for accuracy and completeness, and I agree with the above.

## 2019-03-29 NOTE — Progress Notes (Signed)
Internal Medicine Clinic Attending  Case discussed with Dr. Bloomfield at the time of the visit.  We reviewed the resident's history and exam and pertinent patient test results.  I agree with the assessment, diagnosis, and plan of care documented in the resident's note.  

## 2019-03-30 ENCOUNTER — Other Ambulatory Visit: Payer: Self-pay | Admitting: Internal Medicine

## 2019-03-30 DIAGNOSIS — Z794 Long term (current) use of insulin: Secondary | ICD-10-CM

## 2019-03-30 DIAGNOSIS — E1165 Type 2 diabetes mellitus with hyperglycemia: Secondary | ICD-10-CM

## 2019-03-31 ENCOUNTER — Inpatient Hospital Stay: Payer: Medicare Other

## 2019-03-31 ENCOUNTER — Telehealth: Payer: Self-pay | Admitting: Endocrinology

## 2019-03-31 ENCOUNTER — Inpatient Hospital Stay: Payer: Medicare Other | Attending: Hematology | Admitting: Hematology

## 2019-03-31 ENCOUNTER — Other Ambulatory Visit: Payer: Self-pay

## 2019-03-31 ENCOUNTER — Encounter: Payer: Self-pay | Admitting: Hematology

## 2019-03-31 VITALS — BP 153/52 | HR 63 | Temp 99.1°F | Resp 17 | Ht 63.0 in | Wt 151.0 lb

## 2019-03-31 DIAGNOSIS — F0390 Unspecified dementia without behavioral disturbance: Secondary | ICD-10-CM | POA: Insufficient documentation

## 2019-03-31 DIAGNOSIS — E785 Hyperlipidemia, unspecified: Secondary | ICD-10-CM | POA: Diagnosis not present

## 2019-03-31 DIAGNOSIS — Z794 Long term (current) use of insulin: Secondary | ICD-10-CM | POA: Diagnosis not present

## 2019-03-31 DIAGNOSIS — E1165 Type 2 diabetes mellitus with hyperglycemia: Secondary | ICD-10-CM

## 2019-03-31 DIAGNOSIS — N183 Chronic kidney disease, stage 3 unspecified: Secondary | ICD-10-CM | POA: Insufficient documentation

## 2019-03-31 DIAGNOSIS — I130 Hypertensive heart and chronic kidney disease with heart failure and stage 1 through stage 4 chronic kidney disease, or unspecified chronic kidney disease: Secondary | ICD-10-CM | POA: Diagnosis not present

## 2019-03-31 DIAGNOSIS — E1122 Type 2 diabetes mellitus with diabetic chronic kidney disease: Secondary | ICD-10-CM | POA: Insufficient documentation

## 2019-03-31 DIAGNOSIS — R040 Epistaxis: Secondary | ICD-10-CM | POA: Insufficient documentation

## 2019-03-31 DIAGNOSIS — E1121 Type 2 diabetes mellitus with diabetic nephropathy: Secondary | ICD-10-CM

## 2019-03-31 DIAGNOSIS — I272 Pulmonary hypertension, unspecified: Secondary | ICD-10-CM | POA: Insufficient documentation

## 2019-03-31 DIAGNOSIS — I5032 Chronic diastolic (congestive) heart failure: Secondary | ICD-10-CM | POA: Insufficient documentation

## 2019-03-31 DIAGNOSIS — I780 Hereditary hemorrhagic telangiectasia: Secondary | ICD-10-CM | POA: Insufficient documentation

## 2019-03-31 DIAGNOSIS — Z79899 Other long term (current) drug therapy: Secondary | ICD-10-CM | POA: Diagnosis not present

## 2019-03-31 DIAGNOSIS — Q2733 Arteriovenous malformation of digestive system vessel: Secondary | ICD-10-CM | POA: Insufficient documentation

## 2019-03-31 DIAGNOSIS — D500 Iron deficiency anemia secondary to blood loss (chronic): Secondary | ICD-10-CM

## 2019-03-31 DIAGNOSIS — K219 Gastro-esophageal reflux disease without esophagitis: Secondary | ICD-10-CM | POA: Insufficient documentation

## 2019-03-31 DIAGNOSIS — IMO0002 Reserved for concepts with insufficient information to code with codable children: Secondary | ICD-10-CM

## 2019-03-31 LAB — CBC WITH DIFFERENTIAL (CANCER CENTER ONLY)
Abs Immature Granulocytes: 0.03 10*3/uL (ref 0.00–0.07)
Basophils Absolute: 0 10*3/uL (ref 0.0–0.1)
Basophils Relative: 0 %
Eosinophils Absolute: 0.4 10*3/uL (ref 0.0–0.5)
Eosinophils Relative: 6 %
HCT: 40.4 % (ref 36.0–46.0)
Hemoglobin: 14.1 g/dL (ref 12.0–15.0)
Immature Granulocytes: 0 %
Lymphocytes Relative: 9 %
Lymphs Abs: 0.6 10*3/uL — ABNORMAL LOW (ref 0.7–4.0)
MCH: 30.5 pg (ref 26.0–34.0)
MCHC: 34.9 g/dL (ref 30.0–36.0)
MCV: 87.3 fL (ref 80.0–100.0)
Monocytes Absolute: 0.6 10*3/uL (ref 0.1–1.0)
Monocytes Relative: 9 %
Neutro Abs: 5.2 10*3/uL (ref 1.7–7.7)
Neutrophils Relative %: 76 %
Platelet Count: 154 10*3/uL (ref 150–400)
RBC: 4.63 MIL/uL (ref 3.87–5.11)
RDW: 13 % (ref 11.5–15.5)
WBC Count: 6.9 10*3/uL (ref 4.0–10.5)
nRBC: 0 % (ref 0.0–0.2)

## 2019-03-31 LAB — CMP (CANCER CENTER ONLY)
ALT: 65 U/L — ABNORMAL HIGH (ref 0–44)
AST: 58 U/L — ABNORMAL HIGH (ref 15–41)
Albumin: 2.6 g/dL — ABNORMAL LOW (ref 3.5–5.0)
Alkaline Phosphatase: 282 U/L — ABNORMAL HIGH (ref 38–126)
Anion gap: 9 (ref 5–15)
BUN: 18 mg/dL (ref 8–23)
CO2: 25 mmol/L (ref 22–32)
Calcium: 9.2 mg/dL (ref 8.9–10.3)
Chloride: 106 mmol/L (ref 98–111)
Creatinine: 1.02 mg/dL — ABNORMAL HIGH (ref 0.44–1.00)
GFR, Est AFR Am: >60 mL/min (ref >60)
GFR, Estimated: 52 mL/min — ABNORMAL LOW (ref >60)
Glucose, Bld: 236 mg/dL — ABNORMAL HIGH (ref 70–99)
Potassium: 4.7 mmol/L (ref 3.5–5.1)
Sodium: 140 mmol/L (ref 135–145)
Total Bilirubin: 0.6 mg/dL (ref 0.3–1.2)
Total Protein: 6 g/dL — ABNORMAL LOW (ref 6.5–8.1)

## 2019-03-31 LAB — FERRITIN: Ferritin: 257 ng/mL (ref 11–307)

## 2019-03-31 LAB — TOTAL PROTEIN, URINE DIPSTICK: Protein, ur: 300 mg/dL — AB

## 2019-03-31 MED ORDER — TRESIBA FLEXTOUCH 100 UNIT/ML ~~LOC~~ SOPN: PEN_INJECTOR | SUBCUTANEOUS | 0 refills | Status: DC

## 2019-03-31 MED ORDER — BD PEN NEEDLE MINI U/F 31G X 5 MM MISC: 0 refills | Status: DC

## 2019-03-31 NOTE — Telephone Encounter (Signed)
Rx sent 

## 2019-03-31 NOTE — Telephone Encounter (Signed)
MEDICATION: Tyler Aas, [place]  PHARMACY:  Insurance claims handler on [place] :   IS PATIENT OUT OF MEDICATION: yes  IF NOT; HOW MUCH IS LEFT:   LAST APPOINTMENT DATE: @2 /03/2019  NEXT APPOINTMENT DATE:@4 /06/2019  DO WE HAVE YOUR PERMISSION TO LEAVE A DETAILED MESSAGE: yes  OTHER COMMENTS:    **Let patient know to contact pharmacy at the end of the day to make sure medication is ready. **  ** Please notify patient to allow 48-72 hours to process**  **Encourage patient to contact the pharmacy for refills or they can request refills through [facility]**

## 2019-04-03 ENCOUNTER — Telehealth: Payer: Self-pay | Admitting: Hematology

## 2019-04-03 NOTE — Telephone Encounter (Signed)
I talk with patient regarding schedule  

## 2019-04-24 ENCOUNTER — Other Ambulatory Visit: Payer: Self-pay

## 2019-04-24 MED ORDER — OXYCODONE-ACETAMINOPHEN 2.5-325 MG PO TABS: 1.0000 | ORAL_TABLET | Freq: Every evening | ORAL | 0 refills | Status: DC | PRN

## 2019-04-24 NOTE — Telephone Encounter (Signed)
oxycodone-acetaminophen (PERCOCET) 2.5-325 MG tablet   Refill request @  [place], [place] [phone] (Phone) [phone] (Fax)

## 2019-04-28 ENCOUNTER — Inpatient Hospital Stay: Payer: Medicare Other | Attending: Hematology

## 2019-04-28 ENCOUNTER — Inpatient Hospital Stay: Payer: Medicare Other

## 2019-04-28 ENCOUNTER — Other Ambulatory Visit: Payer: Self-pay

## 2019-04-28 VITALS — BP 157/48 | HR 56 | Resp 17

## 2019-04-28 DIAGNOSIS — I780 Hereditary hemorrhagic telangiectasia: Secondary | ICD-10-CM | POA: Diagnosis present

## 2019-04-28 DIAGNOSIS — D500 Iron deficiency anemia secondary to blood loss (chronic): Secondary | ICD-10-CM

## 2019-04-28 DIAGNOSIS — K31819 Angiodysplasia of stomach and duodenum without bleeding: Secondary | ICD-10-CM

## 2019-04-28 DIAGNOSIS — Z95828 Presence of other vascular implants and grafts: Secondary | ICD-10-CM

## 2019-04-28 LAB — CBC WITH DIFFERENTIAL (CANCER CENTER ONLY)
Abs Immature Granulocytes: 0.03 10*3/uL (ref 0.00–0.07)
Basophils Absolute: 0 10*3/uL (ref 0.0–0.1)
Basophils Relative: 1 %
Eosinophils Absolute: 0.4 10*3/uL (ref 0.0–0.5)
Eosinophils Relative: 7 %
HCT: 33.7 % — ABNORMAL LOW (ref 36.0–46.0)
Hemoglobin: 11.4 g/dL — ABNORMAL LOW (ref 12.0–15.0)
Immature Granulocytes: 1 %
Lymphocytes Relative: 15 %
Lymphs Abs: 0.9 10*3/uL (ref 0.7–4.0)
MCH: 30.5 pg (ref 26.0–34.0)
MCHC: 33.8 g/dL (ref 30.0–36.0)
MCV: 90.1 fL (ref 80.0–100.0)
Monocytes Absolute: 0.7 10*3/uL (ref 0.1–1.0)
Monocytes Relative: 11 %
Neutro Abs: 3.9 10*3/uL (ref 1.7–7.7)
Neutrophils Relative %: 65 %
Platelet Count: 193 10*3/uL (ref 150–400)
RBC: 3.74 MIL/uL — ABNORMAL LOW (ref 3.87–5.11)
RDW: 14.5 % (ref 11.5–15.5)
WBC Count: 5.9 10*3/uL (ref 4.0–10.5)
nRBC: 0 % (ref 0.0–0.2)

## 2019-04-28 LAB — FERRITIN: Ferritin: 95 ng/mL (ref 11–307)

## 2019-04-28 LAB — TOTAL PROTEIN, URINE DIPSTICK: Protein, ur: 300 mg/dL — AB

## 2019-04-28 MED ORDER — SODIUM CHLORIDE 0.9% FLUSH
10.0000 mL | INTRAVENOUS | Status: DC | PRN
  Administered 2019-04-28: 10 mL
  Filled 2019-04-28: qty 10

## 2019-04-28 MED ORDER — HEPARIN SOD (PORK) LOCK FLUSH 100 UNIT/ML IV SOLN
500.0000 [IU] | Freq: Once | INTRAVENOUS | Status: AC | PRN
  Administered 2019-04-28: 500 [IU]
  Filled 2019-04-28: qty 5

## 2019-05-03 ENCOUNTER — Telehealth: Payer: Self-pay | Admitting: Hematology

## 2019-05-03 ENCOUNTER — Telehealth: Payer: Self-pay

## 2019-05-03 NOTE — Telephone Encounter (Signed)
-----   Message from Truitt Merle, MD sent at 05/02/2019 10:17 AM EDT ----- Please let pt know she has developed mild anemia, please schedule iv iron feraheme once for her this or next week. Please also add f/u and Avastin infusion for her next appointment in April, thanks   Truitt Merle  05/02/2019

## 2019-05-03 NOTE — Telephone Encounter (Signed)
Scheduled appt per 3/24 sch message- pt husband aware of appt

## 2019-05-03 NOTE — Telephone Encounter (Signed)
I spoke to [name] and let her know that she is a [name] anemic and needs to have a feraheme infusion this or next week.  She verbalized understanding.  Scheduling message sent.  Also to add avastin infusion to appts on 4/16

## 2019-05-10 ENCOUNTER — Other Ambulatory Visit: Payer: Self-pay

## 2019-05-10 ENCOUNTER — Inpatient Hospital Stay: Payer: Medicare Other

## 2019-05-10 VITALS — BP 130/45 | HR 52 | Temp 98.6°F | Resp 18

## 2019-05-10 DIAGNOSIS — I780 Hereditary hemorrhagic telangiectasia: Secondary | ICD-10-CM | POA: Diagnosis not present

## 2019-05-10 DIAGNOSIS — K31819 Angiodysplasia of stomach and duodenum without bleeding: Secondary | ICD-10-CM

## 2019-05-10 DIAGNOSIS — D508 Other iron deficiency anemias: Secondary | ICD-10-CM

## 2019-05-10 DIAGNOSIS — K31811 Angiodysplasia of stomach and duodenum with bleeding: Secondary | ICD-10-CM

## 2019-05-10 MED ORDER — SODIUM CHLORIDE 0.9 % IV SOLN
510.0000 mg | Freq: Once | INTRAVENOUS | Status: AC
  Administered 2019-05-10: 510 mg via INTRAVENOUS
  Filled 2019-05-10: qty 510

## 2019-05-10 MED ORDER — SODIUM CHLORIDE 0.9 % IJ SOLN
10.0000 mL | INTRAMUSCULAR | Status: DC | PRN
  Administered 2019-05-10: 10 mL
  Filled 2019-05-10: qty 10

## 2019-05-10 MED ORDER — HEPARIN SOD (PORK) LOCK FLUSH 100 UNIT/ML IV SOLN
500.0000 [IU] | Freq: Once | INTRAVENOUS | Status: AC | PRN
  Administered 2019-05-10: 500 [IU]
  Filled 2019-05-10: qty 5

## 2019-05-10 MED ORDER — SODIUM CHLORIDE 0.9 % IV SOLN
Freq: Once | INTRAVENOUS | Status: AC
  Filled 2019-05-10: qty 250

## 2019-05-10 NOTE — Patient Instructions (Signed)

## 2019-05-15 ENCOUNTER — Other Ambulatory Visit: Payer: Self-pay

## 2019-05-15 ENCOUNTER — Ambulatory Visit: Payer: Medicare Other | Admitting: Endocrinology

## 2019-05-16 ENCOUNTER — Encounter: Payer: Self-pay | Admitting: Endocrinology

## 2019-05-16 ENCOUNTER — Ambulatory Visit (INDEPENDENT_AMBULATORY_CARE_PROVIDER_SITE_OTHER): Payer: Medicare Other | Admitting: Endocrinology

## 2019-05-16 VITALS — BP 146/50 | HR 60 | Ht 63.0 in | Wt 151.8 lb

## 2019-05-16 DIAGNOSIS — E1121 Type 2 diabetes mellitus with diabetic nephropathy: Secondary | ICD-10-CM

## 2019-05-16 DIAGNOSIS — E1165 Type 2 diabetes mellitus with hyperglycemia: Secondary | ICD-10-CM | POA: Diagnosis not present

## 2019-05-16 DIAGNOSIS — IMO0002 Reserved for concepts with insufficient information to code with codable children: Secondary | ICD-10-CM

## 2019-05-16 LAB — POCT GLYCOSYLATED HEMOGLOBIN (HGB A1C): Hemoglobin A1C: 6.1 % — AB (ref 4.0–5.6)

## 2019-05-16 MED ORDER — TRULICITY 3 MG/0.5ML ~~LOC~~ SOAJ: 3.0000 mg | SUBCUTANEOUS | 2 refills | Status: DC

## 2019-05-16 NOTE — Patient Instructions (Addendum)
Tresiba 10 units in the mornings daily   Humalog 5-6 units before SUPPER daily  TRULICTY 1.5 DOSE, 2 SHOTS AT SAME TIME  CALL if sugars going below 60  Check blood sugars on waking up days a week  Also check blood sugars about 2 hours after meals and do this after different meals by rotation  Recommended blood sugar levels on waking up are 90-130 and about 2 hours after meal is 130-180  Please bring your blood sugar monitor to each visit, thank you

## 2019-05-16 NOTE — Progress Notes (Signed)
Patient ID: [name], female   DOB: 1940-09-23, 79 y.o.   MRN: [id number]           Reason for Appointment: Follow-up for Type 2 Diabetes  Referring PCP: Koleen Distance   History of Present Illness:          Date of diagnosis of type 2 diabetes mellitus: 1988       Background history:   Patient has been on insulin for a few years and she is unclear when this was started, according to Epic she was started on Levemir in 2015 Prior to that likely had been on various oral hypoglycemic agents including metformin, glipizide and Tradjenta Her level of control has been somewhat variable but not consistently poor; her A1c has ranged between 5.6 and 7.6  Recent history:   Her previous A1c was 6.9 in 12/20 and is now 6.1  INSULIN regimen is:  [place] 15 units in the mornings daily, Humalog 5 units before meals      Non-insulin hypoglycemic drugs the patient is taking are: Trulicity 1.5 mg weekly  Current management, blood sugar patterns and problems identified:  She was advised to take Humalog with each meal because of tendency to high readings postprandially but she is not doing this again  She still does not stand that her blood sugars go up after meals and have been as high as 403 in the last 2 weeks  HIGHEST blood sugars appear to be in the afternoons and evenings especially around dinnertime  Not clear which readings are before or after meals as she is checking blood sugar fairly frequently in the evenings  However she is starting to get HYPOGLYCEMIA again usually in the morning hours although somewhat less in the last week  Blood sugars have been as low as 33  This is despite reducing her Tresiba to 15 units  She takes Trulicity regularly also  Her weight is about the same  She has seen the dietitian last year but does not appear to be planning her meals well and sometimes having higher carbohydrate intake        Side effects from medications have been: None known       Meal times are:  Breakfast is at variable times between 8 AM-9  Dinner 7 pm  Typical meal intake: Breakfast is Cheerios or raisin Bran               Exercise:  Unable to do any  Glucose monitoring:  done?  1-2 times a day         Glucometer: One Probation officer.       Blood Glucose readings by download of meter   PRE-MEAL Fasting Lunch Dinner Bedtime Overall  Glucose range:  33-199  59-242  79-335  149-341   Mean/median:  100  118  270  248  194    Previous readings:  PRE-MEAL Fasting Lunch Dinner Bedtime Overall  Glucose range:  66-278  150-295  121-299  224-396   Mean/median:  100   220  255  183     Dietician visit, most recent: 11/2018  Weight history:  Wt Readings from Last 3 Encounters:  05/16/19 151 lb 12.8 oz (68.9 kg)  03/31/19 151 lb (68.5 kg)  03/27/19 147 lb 12.8 oz (67 kg)    Glycemic control:   Lab Results  Component Value Date   HGBA1C 6.1 (A) 05/16/2019   HGBA1C 6.9 (A) 01/30/2019   HGBA1C 8.7 (A) 08/08/2018  Lab Results  Component Value Date   MICROALBUR 1,289.4 (H) 03/07/2019   LDLCALC 57 01/30/2019   CREATININE 1.02 (H) 03/31/2019   Lab Results  Component Value Date   MICRALBCREAT 5 03/28/2018    No results found for: FRUCTOSAMINE  Office Visit on 05/16/2019  Component Date Value Ref Range Status  . Hemoglobin A1C 05/16/2019 6.1* 4.0 - 5.6 % Final    Allergies as of 05/16/2019      Reactions   Aspirin Nausea And Vomiting      Medication List       Accurate as of May 16, 2019  2:03 PM. If you have any questions, ask your nurse or doctor.        STOP taking these medications   Trulicity 1.5 ZO/1.0RU Sopn Generic drug: Dulaglutide Replaced by: Trulicity 3 EA/5.4UJ Sopn Stopped by: Elayne Snare, MD     TAKE these medications   amLODipine 10 MG tablet Commonly known as: NORVASC Take 1 tablet (10 mg total) by mouth daily.   b complex vitamins capsule Take 1 capsule by mouth daily.   B-D UF III MINI PEN NEEDLES 31G X 5 MM  Misc Generic drug: Insulin Pen Needle USE TO INJECT INSULIN  3 TIMES DAILY   calcium-vitamin D 500-200 MG-UNIT tablet Commonly known as: OSCAL WITH D Take 1 tablet by mouth 2 (two) times daily.   cetaphil lotion Apply 1 application topically as needed for dry skin.   donepezil 10 MG tablet Commonly known as: ARICEPT Take 1 tablet (10 mg total) by mouth at bedtime.   Ferrocite 324 (106 Fe) MG Tabs tablet Generic drug: Ferrous Fumarate Take 1 tablet by mouth twice daily   furosemide 20 MG tablet Commonly known as: LASIX Take 1 tablet (20 mg total) by mouth daily.   lactulose 10 GM/15ML solution Commonly known as: CHRONULAC TAKE 15 ML BY MOUTH TWO TIMES DAILY   levETIRAcetam 500 MG tablet Commonly known as: KEPPRA Take 1 tablet (500 mg total) by mouth 2 (two) times daily.   Medical Compression Stockings Misc Wear as much as possible while awake to reduce swelling   memantine 10 MG tablet Commonly known as: NAMENDA Take 1 tablet (10 mg total) by mouth 2 (two) times daily.   OneTouch Delica Lancets Fine Misc Check blood sugar up to 4 times a day   OneTouch Verio test strip Generic drug: glucose blood Use as instructed 4 times daily. E11.29, insulin requiring   OneTouch Verio w/Device Kit Use to check blood sugar up to 4 times a day   oxycodone-acetaminophen 2.5-325 MG tablet Commonly known as: PERCOCET Take 1 tablet by mouth at bedtime as needed for pain.   oxymetazoline 0.05 % nasal spray Commonly known as: AFRIN Place 1 spray into both nostrils 2 (two) times daily as needed (Epistaxis).   pantoprazole 40 MG tablet Commonly known as: PROTONIX Take 1 tablet by mouth once daily   sodium chloride 0.65 % Soln nasal spray Commonly known as: OCEAN Place 1 spray into both nostrils as needed for congestion.   Tyler Aas FlexTouch 100 UNIT/ML FlexTouch Pen Generic drug: insulin degludec INJECT 21 UNITS SUBCUTANEOUSLY IN THE EVENING What changed:   how much to  take  additional instructions  Another medication with the same name was removed. Continue taking this medication, and follow the directions you see here.   triamcinolone ointment 0.5 % Commonly known as: KENALOG APPLY 1 APPLICATION TOPICALLY TO RASH TWICE DAILY FOR ITCHING What changed:   how much  to take  how to take this  when to take this  reasons to take this  additional instructions   Trulicity 3 JG/2.8ZM Sopn Generic drug: Dulaglutide Inject 3 mg into the skin once a week. Replaces: Trulicity 1.5 OQ/9.4TM Sopn Started by: Elayne Snare, MD       Allergies:  Allergies  Allergen Reactions  . Aspirin Nausea And Vomiting    Past Medical History:  Diagnosis Date  . Chronic anemia   . Chronic diastolic CHF (congestive heart failure) ([place]) 10/03/2013  . Chronic GI bleeding    [name] 11/29/2014  . Family history of anesthesia complication    "niece has a hard time coming out" (09/15/2012)  . Frequent nosebleeds    chronic  . Gastric AV malformation    [name] 11/29/2014  . GERD (gastroesophageal reflux disease)   . 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 History of blood transfusion "several"  . History of epistaxis   . HTN (hypertension), benign 03/02/2012  . Hyperlipidemia   . Iron deficiency anemia    chronic infusions"  . Lichen planus    Both lower extremities  . 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 Overgrown toenails 03/18/2017  . Pneumonia 1990's X 2  . Pulmonary HTN ([place]) 04/01/2017   PASP 78mHg on echo 09/2016 and 467mg by echo 2019  . [place]  . Symptomatic anemia 11/29/2014  . Telangiectasia    Gastric   . 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    Past Surgical History:  Procedure Laterality Date  . CATARACT EXTRACTION     "I think it was just one eye"  . ESOPHAGOGASTRODUODENOSCOPY  02/26/2011   Procedure: ESOPHAGOGASTRODUODENOSCOPY (EGD);  Surgeon: [name];   [facility];  Service: Endoscopy;  Laterality: N/A;  . ESOPHAGOGASTRODUODENOSCOPY N/A 11/08/2012   Procedure: ESOPHAGOGASTRODUODENOSCOPY (EGD);  Surgeon: [name];  [facility];  Service: Endoscopy;  Laterality: N/A;  . ESOPHAGOGASTRODUODENOSCOPY N/A 10/04/2013   Procedure: ESOPHAGOGASTRODUODENOSCOPY (EGD);  Surgeon: [name];  [facility];  Service: Endoscopy;  Laterality: N/A;  with APC on stand-by  . ESOPHAGOGASTRODUODENOSCOPY N/A 07/06/2014   Procedure: ESOPHAGOGASTRODUODENOSCOPY (EGD);  Surgeon: [name];  [facility];  Service: Endoscopy;  Laterality: N/A;  . ESOPHAGOGASTRODUODENOSCOPY N/A 09/05/2014   Procedure: ESOPHAGOGASTRODUODENOSCOPY (EGD);  Surgeon: JaLaurence SpatesMD;  [facility];  Service: Endoscopy;  Laterality: N/A;  APC on standby to control bleeding  . ESOPHAGOGASTRODUODENOSCOPY N/A 11/29/2014   Procedure: ESOPHAGOGASTRODUODENOSCOPY (EGD);  Surgeon: [name];  [facility];  Service: Endoscopy;  Laterality: N/A;  . ESOPHAGOGASTRODUODENOSCOPY N/A 09/28/2015   Procedure: ESOPHAGOGASTRODUODENOSCOPY (EGD);  Surgeon: [name];  [facility];  Service: Endoscopy;  Laterality: N/A;  . ESOPHAGOGASTRODUODENOSCOPY (EGD) WITH PROPOFOL N/A 12/04/2017   Procedure: ESOPHAGOGASTRODUODENOSCOPY (EGD) WITH PROPOFOL;  Surgeon: [name];  [facility]: [facility];  Laterality: N/A;  . ESOPHAGOGASTRODUODENOSCOPY ENDOSCOPY  08/19/2006   with laser treatment  . HOT HEMOSTASIS N/A 07/06/2014   Procedure: HOT HEMOSTASIS (ARGON PLASMA COAGULATION/BICAP);  Surgeon: [name];  [facility];  Service: Endoscopy;  Laterality: N/A;  . HOT HEMOSTASIS N/A 09/28/2015   Procedure: HOT HEMOSTASIS (ARGON PLASMA COAGULATION/BICAP);  Surgeon: [name];  [facility];  Service: Endoscopy;  Laterality: N/A;  . HOT HEMOSTASIS N/A 12/04/2017    Procedure: HOT HEMOSTASIS (ARGON PLASMA COAGULATION/BICAP);  Surgeon:  [name], MD;  Location: [place];  Service: Endoscopy;  Laterality: N/A;  . NASAL HEMORRHAGE CONTROL     "for bleeding"   . SAVORY DILATION  02/26/2011   Procedure: SAVORY DILATION;  Surgeon: [name], MD;  [facility]: [name];  Service: Endoscopy;  Laterality: N/A;  c-arm needed  . SUBMUCOSAL INJECTION  12/04/2017   Procedure: SUBMUCOSAL INJECTION;  Surgeon: [name], MD;  [facility];  Service: Endoscopy;;    Family History  Problem Relation Age of Onset  . Stroke Father   . Healthy Mother   . Breast cancer Other   . Diabetes Niece   . Malignant hyperthermia Neg Hx   . Seizures Neg Hx     Social History:  reports that she quit smoking about 48 years ago. Her smoking use included cigarettes. She has a 20.00 pack-year smoking history. She has never used smokeless tobacco. She reports that she does not drink alcohol or use drugs.   Review of Systems  Hematology: She has had history of iron deficiency anemia requiring infusions every 1 to 2 months  Lab Results  Component Value Date   HGB 11.4 (L) 04/28/2019     Lipid history: No increase in LDL as of the last labs:    Lab Results  Component Value Date   CHOL 133 01/30/2019   HDL 41.30 01/30/2019   LDLCALC 57 01/30/2019   TRIG 176.0 (H) 01/30/2019   CHOLHDL 3 01/30/2019           Hypertension: Now taking amlodipine from her PCP  BP Readings from Last 3 Encounters:  05/16/19 (!) 146/50  05/10/19 (!) 130/45  04/28/19 (!) 157/48    Most recent eye exam was in 07/2018  Most recent foot exam: 06/2018 by podiatrist with no evidence of sensory loss or reduction in sensation   Complications of diabetes: Reportedly has retinopathy, currently without a foot exam not clear if she has neuropathy.  Has history of microalbuminuria  LABS:  Office Visit on 05/16/2019  Component Date Value Ref Range Status  .  Hemoglobin A1C 05/16/2019 6.1* 4.0 - 5.6 % Final    Physical Examination:  BP (!) 146/50 (BP Location: Left Arm, Patient Position: Sitting, Cuff Size: Normal)   Pulse 60   Ht _0  (1.6 m)   Wt 151 lb 12.8 oz (68.9 kg)   LMP  (LMP Unknown)   SpO2 96%   BMI 26.89 kg/m    ASSESSMENT:  Diabetes type 2 on insulin  See history of present illness for detailed discussion of current diabetes management, blood sugar patterns and problems identified  Her last A1c was  6.9 and is now 6.1 This is likely falsely low since blood sugars averaging 419  She is on Trulicity, Humalog and [place]  She again appears to be very irregular with her Humalog and she has significant postprandial hyperglycemia especially in the evenings  Even with reducing her Tyler Aas progressively over the last few visits she continues to have periodic low blood sugars in the mornings However this has been a [place] less in the last week She will still sometimes eat cereal or other high carbohydrate meals  She has been explained the reason for taking her Humalog before eating but she still forgets to do this or is afraid of taking it Discussed that Humalog lasts only 4 hours and will not cause low sugars the next morning May also potentially benefit from seeing the dietitian again for better meal planning  HYPERTENSION: Well-controlled  Lipids: Previously well controlled  She appears to have nephrotic syndrome, not clear if this has been followed by nephrologist  PLAN:    Increase Trulicity up to 3 mg weekly This may reduce her postprandial readings better and also help with her weight loss For simplicity she will be given Humalog at suppertime only so she can follow a definite regimen She will take 5 to 6 units based on how much she is planning to eat We will reduce her Tyler Aas down by 5 units to only 10 units and may consider stopping this if fasting readings are again low normal She was advised to call if  she will continue to have low sugars overnight Although she is a good candidate for the freestyle Elenor Legato it is unlikely that she would be able to learn how to use this Will discuss again on the next visit  Patient Instructions   Tresiba 10 units in the mornings daily   Humalog 5-6 units before SUPPER daily  TRULICTY 1.5 DOSE, 2 SHOTS AT SAME TIME         Elayne Snare 05/16/2019, 2:03 PM   Note: This office note was prepared with Dragon voice recognition system technology. Any transcriptional errors that result from this process are unintentional.

## 2019-05-22 NOTE — Progress Notes (Signed)
Pharmacist Chemotherapy Monitoring - Follow Up Assessment    I verify that I have reviewed each item in the below checklist:  . Regimen for the patient is scheduled for the appropriate day and plan matches scheduled date. Marland Kitchen Appropriate non-routine labs are ordered dependent on drug ordered. . If applicable, additional medications reviewed and ordered per protocol based on lifetime cumulative doses and/or treatment regimen.   Plan for follow-up and/or issues identified: Yes . I-vent associated with next due treatment: Yes . MD and/or nursing notified: No   Kennith Center, Pharm.D., CPP 05/22/2019@1 :35 PM

## 2019-05-26 ENCOUNTER — Inpatient Hospital Stay: Payer: Medicare Other

## 2019-05-26 ENCOUNTER — Inpatient Hospital Stay: Payer: Medicare Other | Attending: Hematology

## 2019-05-26 ENCOUNTER — Other Ambulatory Visit: Payer: Self-pay

## 2019-05-26 VITALS — BP 154/54 | HR 51 | Temp 98.7°F | Resp 18

## 2019-05-26 DIAGNOSIS — Z5112 Encounter for antineoplastic immunotherapy: Secondary | ICD-10-CM | POA: Insufficient documentation

## 2019-05-26 DIAGNOSIS — D509 Iron deficiency anemia, unspecified: Secondary | ICD-10-CM | POA: Insufficient documentation

## 2019-05-26 DIAGNOSIS — I780 Hereditary hemorrhagic telangiectasia: Secondary | ICD-10-CM

## 2019-05-26 DIAGNOSIS — D500 Iron deficiency anemia secondary to blood loss (chronic): Secondary | ICD-10-CM

## 2019-05-26 LAB — CBC WITH DIFFERENTIAL (CANCER CENTER ONLY)
Abs Immature Granulocytes: 0.03 10*3/uL (ref 0.00–0.07)
Basophils Absolute: 0 10*3/uL (ref 0.0–0.1)
Basophils Relative: 1 %
Eosinophils Absolute: 0.3 10*3/uL (ref 0.0–0.5)
Eosinophils Relative: 5 %
HCT: 30.2 % — ABNORMAL LOW (ref 36.0–46.0)
Hemoglobin: 9.6 g/dL — ABNORMAL LOW (ref 12.0–15.0)
Immature Granulocytes: 1 %
Lymphocytes Relative: 9 %
Lymphs Abs: 0.6 10*3/uL — ABNORMAL LOW (ref 0.7–4.0)
MCH: 30.9 pg (ref 26.0–34.0)
MCHC: 31.8 g/dL (ref 30.0–36.0)
MCV: 97.1 fL (ref 80.0–100.0)
Monocytes Absolute: 0.7 10*3/uL (ref 0.1–1.0)
Monocytes Relative: 10 %
Neutro Abs: 4.9 10*3/uL (ref 1.7–7.7)
Neutrophils Relative %: 74 %
Platelet Count: 161 10*3/uL (ref 150–400)
RBC: 3.11 MIL/uL — ABNORMAL LOW (ref 3.87–5.11)
RDW: 15.9 % — ABNORMAL HIGH (ref 11.5–15.5)
WBC Count: 6.5 10*3/uL (ref 4.0–10.5)
nRBC: 0 % (ref 0.0–0.2)

## 2019-05-26 LAB — CMP (CANCER CENTER ONLY)
ALT: 51 U/L — ABNORMAL HIGH (ref 0–44)
AST: 56 U/L — ABNORMAL HIGH (ref 15–41)
Albumin: 2.8 g/dL — ABNORMAL LOW (ref 3.5–5.0)
Alkaline Phosphatase: 263 U/L — ABNORMAL HIGH (ref 38–126)
Anion gap: 7 (ref 5–15)
BUN: 23 mg/dL (ref 8–23)
CO2: 25 mmol/L (ref 22–32)
Calcium: 9.1 mg/dL (ref 8.9–10.3)
Chloride: 110 mmol/L (ref 98–111)
Creatinine: 1.14 mg/dL — ABNORMAL HIGH (ref 0.44–1.00)
GFR, Est AFR Am: 53 mL/min — ABNORMAL LOW (ref >60)
GFR, Estimated: 46 mL/min — ABNORMAL LOW (ref >60)
Glucose, Bld: 208 mg/dL — ABNORMAL HIGH (ref 70–99)
Potassium: 4.3 mmol/L (ref 3.5–5.1)
Sodium: 142 mmol/L (ref 135–145)
Total Bilirubin: 0.5 mg/dL (ref 0.3–1.2)
Total Protein: 5.8 g/dL — ABNORMAL LOW (ref 6.5–8.1)

## 2019-05-26 LAB — TOTAL PROTEIN, URINE DIPSTICK: Protein, ur: 100 mg/dL — AB

## 2019-05-26 LAB — FERRITIN: Ferritin: 240 ng/mL (ref 11–307)

## 2019-05-26 MED ORDER — SODIUM CHLORIDE 0.9 % IV SOLN
4.6000 mg/kg | Freq: Once | INTRAVENOUS | Status: AC
  Administered 2019-05-26: 14:00:00 300 mg via INTRAVENOUS
  Filled 2019-05-26: qty 12

## 2019-05-26 MED ORDER — HEPARIN SOD (PORK) LOCK FLUSH 100 UNIT/ML IV SOLN
500.0000 [IU] | Freq: Once | INTRAVENOUS | Status: AC | PRN
  Administered 2019-05-26: 14:00:00 500 [IU]
  Filled 2019-05-26: qty 5

## 2019-05-26 MED ORDER — SODIUM CHLORIDE 0.9 % IV SOLN
Freq: Once | INTRAVENOUS | Status: AC
  Filled 2019-05-26: qty 250

## 2019-05-26 MED ORDER — SODIUM CHLORIDE 0.9% FLUSH
10.0000 mL | INTRAVENOUS | Status: DC | PRN
  Administered 2019-05-26: 10 mL
  Filled 2019-05-26: qty 10

## 2019-05-26 NOTE — Progress Notes (Signed)
Ok to treat per MD Burr Medico and UA results

## 2019-05-26 NOTE — Patient Instructions (Signed)
[facility] Discharge Instructions for Patients Receiving Chemotherapy  Today you received the following chemotherapy agents Bevacizumab  To help prevent nausea and vomiting after your treatment, we encourage you to take your nausea medication as directed   If you develop nausea and vomiting that is not controlled by your nausea medication, call the clinic.   BELOW ARE SYMPTOMS THAT SHOULD BE REPORTED IMMEDIATELY:  *FEVER GREATER THAN 100.5 F  *CHILLS WITH OR WITHOUT FEVER  NAUSEA AND VOMITING THAT IS NOT CONTROLLED WITH YOUR NAUSEA MEDICATION  *UNUSUAL SHORTNESS OF BREATH  *UNUSUAL BRUISING OR BLEEDING  TENDERNESS IN MOUTH AND THROAT WITH OR WITHOUT PRESENCE OF ULCERS  *URINARY PROBLEMS  *BOWEL PROBLEMS  UNUSUAL RASH Items with * indicate a potential emergency and should be followed up as soon as possible.  Feel free to call the clinic should you have any questions or concerns. The clinic phone number is [phone].  Please show the CHEMO ALERT CARD at check-in to the Emergency Department and triage nurse.   

## 2019-06-06 ENCOUNTER — Other Ambulatory Visit: Payer: Self-pay | Admitting: Endocrinology

## 2019-06-06 DIAGNOSIS — E1165 Type 2 diabetes mellitus with hyperglycemia: Secondary | ICD-10-CM

## 2019-06-06 DIAGNOSIS — Z794 Long term (current) use of insulin: Secondary | ICD-10-CM

## 2019-06-09 ENCOUNTER — Other Ambulatory Visit: Payer: Self-pay | Admitting: Endocrinology

## 2019-06-09 DIAGNOSIS — IMO0002 Reserved for concepts with insufficient information to code with codable children: Secondary | ICD-10-CM

## 2019-06-09 DIAGNOSIS — E1121 Type 2 diabetes mellitus with diabetic nephropathy: Secondary | ICD-10-CM

## 2019-06-22 ENCOUNTER — Other Ambulatory Visit: Payer: Self-pay

## 2019-06-22 ENCOUNTER — Other Ambulatory Visit: Payer: Medicare Other

## 2019-06-22 MED ORDER — OXYCODONE-ACETAMINOPHEN 2.5-325 MG PO TABS: 1.0000 | ORAL_TABLET | Freq: Every evening | ORAL | 0 refills | Status: DC | PRN

## 2019-06-22 NOTE — Telephone Encounter (Signed)
oxycodone-acetaminophen (PERCOCET) 2.5-325 MG tablet   Refill request @  [place], [place] [phone] (Phone) [phone] (Fax)

## 2019-06-22 NOTE — Progress Notes (Signed)
[name]   [phone] [phone]   Clinic Follow up Note   Patient Care Team: Delice Bison, DO as PCP - General [name], MD as PCP - Cardiology (Cardiology) Truitt Merle, MD as Consulting Physician (Hematology) Elayne Snare, MD as Consulting Physician (Endocrinology)  Date of Service:  06/23/2019  CHIEF COMPLAINT: F/u ofAnemia due to chronic blood loss, HHT  HEMATOLOGY HISTORY 1.Hereditary hemorrhagic telangiectasia (HHT),with epistaxis andfrequentGI bleeding. Previously under care of Dr. [name] 2.Anemia secondary to GI bleeding and iron deficiency, required blood transfusion every 3 to 4 weeks, IV Feraheme every 1 to 2 months  CURRENT THERAPY 1. Iv feraheme as needed if ferritin<50, latest on 09/12/18 2.Avastin45m/kg every 2 weeks, starting3/30/20. Reduced to every 4 weeks starting 11/07/18.Reduced to every 2 months starting 01/02/19. Held since 03/09/19-05/26/19 due to high proteinuria. 3. Blood transfusion if Hg<8.0  INTERVAL HISTORY:  [name] here for a follow up of anemia and HHT. Her husband was called to be included in visit. She presents to the clinic alone. She notes for some times (months) she has been having presentation of skin lesions from lower legs to her groin. She notes this itches. She has not been seen for this. She notes her energy has been poor lately and not doing much at home. She is mostly sitting or laying down. He notes her PCP is Dr BKoleen Distance Her BG has been better lately.    REVIEW OF SYSTEMS:   Constitutional: Denies fevers, chills or abnormal weight loss (+) Fatigue  Eyes: Denies blurriness of vision Ears, nose, mouth, throat, and face: Denies mucositis or sore throat Respiratory: Denies cough, dyspnea or wheezes Cardiovascular: Denies palpitation, chest discomfort or lower extremity swelling Gastrointestinal:  Denies nausea, heartburn or change in bowel habits Skin: Denies  abnormal skin rashes (+) Skin lesions of Legs.  Lymphatics: Denies new lymphadenopathy or easy bruising Neurological:Denies numbness, tingling or new weaknesses Behavioral/Psych: Mood is stable, no new changes  All other systems were reviewed with the patient and are negative.  MEDICAL HISTORY:  Past Medical History:  Diagnosis Date  . Chronic anemia   . Chronic diastolic CHF (congestive heart failure) ([place] 10/03/2013  . Chronic GI bleeding    /[name]10/20/2016  . Family history of anesthesia complication    "niece has a hard time coming out" (09/15/2012)  . Frequent nosebleeds    chronic  . Gastric AV malformation    /[name]10/20/2016  . GERD (gastroesophageal reflux disease)   . 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 History of blood transfusion "several"  . History of epistaxis   . HTN (hypertension), benign 03/02/2012  . Hyperlipidemia   . Iron deficiency anemia    chronic infusions"  . Lichen planus    Both lower extremities  . 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 Overgrown toenails 03/18/2017  . Pneumonia 1990's X 2  . Pulmonary HTN ([place] 04/01/2017   PASP 524mg on echo 09/2016 and 4566m by echo 2019  . Seizures ([place]  . Symptomatic anemia 11/29/2014  . Telangiectasia    Gastric   . 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    SURGICAL HISTORY: Past Surgical History:  Procedure Laterality Date  . CATARACT EXTRACTION     "I think it was just one eye"  . ESOPHAGOGASTRODUODENOSCOPY  02/26/2011   Procedure: ESOPHAGOGASTRODUODENOSCOPY (EGD);  Surgeon: JohMissy SabinsD;  [facility];  Service: Endoscopy;  Laterality: N/A;  . ESOPHAGOGASTRODUODENOSCOPY N/A 11/08/2012   Procedure: ESOPHAGOGASTRODUODENOSCOPY (EGD);  Surgeon: Patrick D Hung, MD;  Location: [name];  Service: Endoscopy;  Laterality: N/A;  . ESOPHAGOGASTRODUODENOSCOPY N/A 10/04/2013   Procedure: ESOPHAGOGASTRODUODENOSCOPY (EGD);   Surgeon: James L Edwards Jr., MD;  Location: [name];  Service: Endoscopy;  Laterality: N/A;  with APC on stand-by  . ESOPHAGOGASTRODUODENOSCOPY N/A 07/06/2014   Procedure: ESOPHAGOGASTRODUODENOSCOPY (EGD);  Surgeon: Marc Magod, MD;  Location: [name];  Service: Endoscopy;  Laterality: N/A;  . ESOPHAGOGASTRODUODENOSCOPY N/A 09/05/2014   Procedure: ESOPHAGOGASTRODUODENOSCOPY (EGD);  Surgeon: James Edwards, MD;  Location: [name];  Service: Endoscopy;  Laterality: N/A;  APC on standby to control bleeding  . ESOPHAGOGASTRODUODENOSCOPY N/A 11/29/2014   Procedure: ESOPHAGOGASTRODUODENOSCOPY (EGD);  Surgeon: Vincent Schooler, MD;  Location: [facility];  Service: Endoscopy;  Laterality: N/A;  . ESOPHAGOGASTRODUODENOSCOPY N/A 09/28/2015   Procedure: ESOPHAGOGASTRODUODENOSCOPY (EGD);  Surgeon: Marc Magod, MD;  Location: [facility] ENDOSCOPY;  Service: Endoscopy;  Laterality: N/A;  . ESOPHAGOGASTRODUODENOSCOPY (EGD) WITH PROPOFOL N/A 12/04/2017   Procedure: ESOPHAGOGASTRODUODENOSCOPY (EGD) WITH PROPOFOL;  Surgeon: Schooler, Vincent, MD;  Location: [facility] END[facility]COPY;  Service: Endoscopy;  Laterality: N/A;  . ESOPHAGOGASTRODUODENOSCOPY ENDOSCOPY  08/19/2006   with laser treatment  . HOT HEMOSTASIS N/A 07/06/2014   Procedure: HOT HEMOSTASIS (ARGON PLASMA COAGULATION/BICAP);  Surgeon: Marc Magod, MD;  Location: [name];  Service: Endoscopy;  Laterality: N/A;  . HOT HEMOSTASIS N/A 09/28/2015   Procedure: HOT HEMOSTASIS (ARGON PLASMA COAGULATION/BICAP);  Surgeon: Marc Magod, MD;  Location: [facility];  Service: Endoscopy;  Laterality: N/A;  . HOT HEMOSTASIS N/A 12/04/2017   Procedure: HOT HEMOSTASIS (ARGON PLASMA COAGULATION/BICAP);  Surgeon: Schooler, Vincent, MD;  Location: [facility];  Service: Endoscopy;  Laterality: N/A;  . NASAL HEMORRHAGE CONTROL     "for bleeding"   . SAVORY DILATION  02/26/2011   Procedure: SAVORY DILATION;  Surgeon: [name] C Hayes, MD;  Location: [name];  Service:  Endoscopy;  Laterality: N/A;  c-arm needed  . SUBMUCOSAL INJECTION  12/04/2017   Procedure: SUBMUCOSAL INJECTION;  Surgeon: Schooler, Vincent, MD;  Location: [facility];  Service: Endoscopy;;    I have reviewed the social history and family history with the patient and they are unchanged from previous note.  ALLERGIES:  is allergic to aspirin.  MEDICATIONS:  Current Outpatient Medications  Medication Sig Dispense Refill  . amLODipine (NORVASC) 10 MG tablet Take 1 tablet (10 mg total) by mouth daily. 30 tablet 2  . b complex vitamins capsule Take 1 capsule by mouth daily. 100 capsule 2  . Blood Glucose Monitoring Suppl (ONETOUCH VERIO) w/Device KIT Use to check blood sugar up to 4 times a day 1 kit 1  . calcium-vitamin D (OSCAL WITH D) 500-200 MG-UNIT tablet Take 1 tablet by mouth 2 (two) times daily.    . cetaphil (CETAPHIL) lotion Apply 1 application topically as needed for dry skin. 236 mL 0  . donepezil (ARICEPT) 10 MG tablet Take 1 tablet (10 mg total) by mouth at bedtime. 90 tablet 4  . Dulaglutide (TRULICITY) 3 MG/0.5ML SOPN Inject 3 mg into the skin once a week. 4 pen 2  . Elastic Bandages & Supports (MEDICAL COMPRESSION STOCKINGS) MISC Wear as much as possible while awake to reduce swelling 2 each 0  . FERROCITE 324 MG TABS tablet Take 1 tablet by mouth twice daily 60 tablet 5  . furosemide (LASIX) 20 MG tablet Take 1 tablet (20 mg total) by mouth daily. 90 tablet   3  . glucose blood (ONETOUCH VERIO) test strip Use as instructed 4 times daily. E11.29, insulin requiring 350 each 3  . Insulin Pen Needle (B-D UF III MINI PEN NEEDLES) 31G X 5 MM MISC USE TO INJECT INSULIN 3 TIMES A DAY 100 each 0  . lactulose (CHRONULAC) 10 GM/15ML solution TAKE 15 ML BY MOUTH TWO TIMES DAILY 473 mL 2  . levETIRAcetam (KEPPRA) 500 MG tablet Take 1 tablet (500 mg total) by mouth 2 (two) times daily. 180 tablet 4  . memantine (NAMENDA) 10 MG tablet Take 1 tablet (10 mg total) by mouth 2 (two) times  daily. 180 tablet 4  . ONETOUCH DELICA LANCETS FINE MISC Check blood sugar up to 4 times a day 300 each 4  . oxycodone-acetaminophen (PERCOCET) 2.5-325 MG tablet Take 1 tablet by mouth at bedtime as needed for pain. 30 tablet 0  . oxymetazoline (AFRIN) 0.05 % nasal spray Place 1 spray into both nostrils 2 (two) times daily as needed (Epistaxis). 30 mL 0  . pantoprazole (PROTONIX) 40 MG tablet Take 1 tablet by mouth once daily 180 tablet 0  . sodium chloride (OCEAN) 0.65 % SOLN nasal spray Place 1 spray into both nostrils as needed for congestion. 60 mL 0  . TRESIBA FLEXTOUCH 100 UNIT/ML FlexTouch Pen INJECT 21 UNITS SUBCUTANEOUSLY IN THE EVENING 15 mL 0  . triamcinolone ointment (KENALOG) 0.5 % APPLY 1 APPLICATION TOPICALLY TO RASH TWICE DAILY FOR ITCHING (Patient taking differently: Apply 1 application topically 2 (two) times daily as needed (itching). ) 15 g 1   No current facility-administered medications for this visit.    PHYSICAL EXAMINATION: ECOG PERFORMANCE STATUS: 2 - Symptomatic, <50% confined to bed  Vitals:   06/23/19 1047 06/23/19 1049  BP: (!) 159/46 (!) 159/56  Pulse: 60   Resp: 17   Temp: 98.9 F (37.2 C)   SpO2: 100%    Filed Weights   06/23/19 1047  Weight: 161 lb 8 oz (73.3 kg)    Due to COVID19 we will limit examination to appearance. Patient had no complaints.  GENERAL:alert, no distress and comfortable SKIN: (+) Diffuse papular hypopigmentation skin lesions of b/l Legs EYES: normal, Conjunctiva are pink and non-injected, sclera clear  NEURO: alert & oriented x 3 with fluent speech   LABORATORY DATA:  I have reviewed the data as listed CBC Latest Ref Rng & Units 06/23/2019 05/26/2019 04/28/2019  WBC 4.0 - 10.5 K/uL 7.7 6.5 5.9  Hemoglobin 12.0 - 15.0 g/dL 10.6(L) 9.6(L) 11.4(L)  Hematocrit 36.0 - 46.0 % 32.9(L) 30.2(L) 33.7(L)  Platelets 150 - 400 K/uL 199 161 193     CMP Latest Ref Rng & Units 05/26/2019 03/31/2019 01/30/2019  Glucose 70 - 99 mg/dL  208(H) 236(H) 296(H)  BUN 8 - 23 mg/dL 23 18 22  Creatinine 0.44 - 1.00 mg/dL 1.14(H) 1.02(H) 1.14  Sodium 135 - 145 mmol/L 142 140 139  Potassium 3.5 - 5.1 mmol/L 4.3 4.7 5.0  Chloride 98 - 111 mmol/L 110 106 106  CO2 22 - 32 mmol/L 25 25 26  Calcium 8.9 - 10.3 mg/dL 9.1 9.2 9.2  Total Protein 6.5 - 8.1 g/dL 5.8(L) 6.0(L) -  Total Bilirubin 0.3 - 1.2 mg/dL 0.5 0.6 -  Alkaline Phos 38 - 126 U/L 263(H) 282(H) -  AST 15 - 41 U/L 56(H) 58(H) -  ALT 0 - 44 U/L 51(H) 65(H) -      RADIOGRAPHIC STUDIES: I have personally reviewed the radiological images as listed and   agreed with the findings in the report. No results found.   ASSESSMENT & PLAN:  [name] is a 79 y.o. female with    1.Hereditary hemorrhagic telangiectasia(HHT)), withepistaxis and frequent GI bleeding -She has mild epistaxis, and frequent GI bleeding,she used torequire blood transfusion every 3 to 4 weeks. Her last blood transfusion was on 04/13/2018 -She started Avastin on 05/09/18.she responded very well, anemia resolved. I have changed it toevery 2 monthssince Nov 2020.She responded well but has had elevated proteinuria. Her 24 urine showed protein at 2739mg on 03/09/19. Avastin was  Reduced to every 2 months in 12/2018 and then held since 03/09/19-05/26/19 due to high proteinuria. -Since being off Avastin her anemia progressed. She was able to get one dose Avastin on 05/26/19. CBC reviewed, Hg 10.6, improved. Ferritin still pending. Her Urine protein remains high at 300 today. Will proceed with Avastin today due to her persistent anemia.  -her protenuria is also related to her DM. I recommend she f/u with Dr. Kumar soon.  -continue IVFeraheme if ferritin<50andContinue oral iron. -F/u in8 weeks   2. 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    3.Iron deficiency anemia secondary to #2  -She does not tolerate oral ironwell but is on it -will continueIV Feraheme if ferritin less than 50.Last IV Feraheme8/3/20.  4.Dementia -She takes Ariceptand memantine -f/u with neurology. She has not been seen in some time. 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 -I encouraged her husband to come in for visit.   5. DM,CHFand CKD stage III -She is on insulin, DM not well controlled.She is seen by Dr. Kumar -She will continue to monitorher blood glucose at home -CKD andCHF stable clinically. -Her BG has fluctuating lately ranging from 400 to 51. I highly recommend she see Dr. Kumar soon for better management and refill of insulin.   6.Hypertension, secondary to Avastin -She has not been on HTN medication before.  -Per pt and her husbandSBP is elevated in 150-170 range at home.  -On 01/02/19 I started her on5mg amlodipinewith mild improvement. -continue monitoring   8. Worsening proteinuria -Likely related to Avastin, she has diabetes, which can cause proteinuria also -Her 24 urine showed protein at 2739 on 03/07/19. Will hold avastin.  -We will Reach Out her PCP and endocrinologist to see ifACE would be a good option for her.  -Urine protein remains but high but given worsened anemia will give her Avastin as needed. I advised her to f/u with Dr. Kumar   9. Diffuse Skin lesions of B/l legs  -Seen on exam today (06/23/10) -Per pt she has had this for some time (months) and seen dermatologist before. I recommend she f/u with dermatologist soon.    Plan -will proceed with Avastin today  -will set up iv feraheme if ferritin<50 -Lab in 4 weeks to see if she needs blood transfusion   -Lab, f/u and avastin 8 weeks    No problem-specific Assessment & Plan notes found for this encounter.   No orders of the defined types were placed in this encounter.  All questions were answered. The patient knows to call the clinic with any problems, questions   or concerns. No barriers to learning was detected. The total time spent in the appointment was 30 minutes.      , MD 06/23/2019   I, Amoya Bennett, am acting as scribe for  , MD.   I have reviewed the above documentation for accuracy and completeness, and I agree with the above.       

## 2019-06-23 ENCOUNTER — Inpatient Hospital Stay (HOSPITAL_BASED_OUTPATIENT_CLINIC_OR_DEPARTMENT_OTHER): Payer: Medicare Other | Admitting: Hematology

## 2019-06-23 ENCOUNTER — Inpatient Hospital Stay: Payer: Medicare Other | Attending: Hematology

## 2019-06-23 ENCOUNTER — Other Ambulatory Visit: Payer: Self-pay

## 2019-06-23 ENCOUNTER — Inpatient Hospital Stay: Payer: Medicare Other

## 2019-06-23 ENCOUNTER — Encounter: Payer: Self-pay | Admitting: Hematology

## 2019-06-23 VITALS — BP 159/56 | HR 60 | Temp 98.9°F | Resp 17 | Ht 63.0 in | Wt 161.5 lb

## 2019-06-23 DIAGNOSIS — K219 Gastro-esophageal reflux disease without esophagitis: Secondary | ICD-10-CM | POA: Diagnosis not present

## 2019-06-23 DIAGNOSIS — N183 Chronic kidney disease, stage 3 unspecified: Secondary | ICD-10-CM | POA: Insufficient documentation

## 2019-06-23 DIAGNOSIS — D500 Iron deficiency anemia secondary to blood loss (chronic): Secondary | ICD-10-CM

## 2019-06-23 DIAGNOSIS — Z5112 Encounter for antineoplastic immunotherapy: Secondary | ICD-10-CM | POA: Diagnosis not present

## 2019-06-23 DIAGNOSIS — K922 Gastrointestinal hemorrhage, unspecified: Secondary | ICD-10-CM | POA: Insufficient documentation

## 2019-06-23 DIAGNOSIS — L989 Disorder of the skin and subcutaneous tissue, unspecified: Secondary | ICD-10-CM | POA: Diagnosis not present

## 2019-06-23 DIAGNOSIS — E1022 Type 1 diabetes mellitus with diabetic chronic kidney disease: Secondary | ICD-10-CM | POA: Insufficient documentation

## 2019-06-23 DIAGNOSIS — Z95828 Presence of other vascular implants and grafts: Secondary | ICD-10-CM

## 2019-06-23 DIAGNOSIS — I780 Hereditary hemorrhagic telangiectasia: Secondary | ICD-10-CM

## 2019-06-23 DIAGNOSIS — D508 Other iron deficiency anemias: Secondary | ICD-10-CM

## 2019-06-23 DIAGNOSIS — E785 Hyperlipidemia, unspecified: Secondary | ICD-10-CM | POA: Diagnosis not present

## 2019-06-23 DIAGNOSIS — I5032 Chronic diastolic (congestive) heart failure: Secondary | ICD-10-CM | POA: Diagnosis not present

## 2019-06-23 DIAGNOSIS — I130 Hypertensive heart and chronic kidney disease with heart failure and stage 1 through stage 4 chronic kidney disease, or unspecified chronic kidney disease: Secondary | ICD-10-CM | POA: Diagnosis not present

## 2019-06-23 DIAGNOSIS — Z794 Long term (current) use of insulin: Secondary | ICD-10-CM | POA: Diagnosis not present

## 2019-06-23 DIAGNOSIS — K31811 Angiodysplasia of stomach and duodenum with bleeding: Secondary | ICD-10-CM

## 2019-06-23 DIAGNOSIS — F0390 Unspecified dementia without behavioral disturbance: Secondary | ICD-10-CM | POA: Diagnosis not present

## 2019-06-23 DIAGNOSIS — Z79899 Other long term (current) drug therapy: Secondary | ICD-10-CM | POA: Diagnosis not present

## 2019-06-23 DIAGNOSIS — K31819 Angiodysplasia of stomach and duodenum without bleeding: Secondary | ICD-10-CM

## 2019-06-23 LAB — CBC WITH DIFFERENTIAL (CANCER CENTER ONLY)
Abs Immature Granulocytes: 0.05 10*3/uL (ref 0.00–0.07)
Basophils Absolute: 0 10*3/uL (ref 0.0–0.1)
Basophils Relative: 1 %
Eosinophils Absolute: 0.5 10*3/uL (ref 0.0–0.5)
Eosinophils Relative: 6 %
HCT: 32.9 % — ABNORMAL LOW (ref 36.0–46.0)
Hemoglobin: 10.6 g/dL — ABNORMAL LOW (ref 12.0–15.0)
Immature Granulocytes: 1 %
Lymphocytes Relative: 11 %
Lymphs Abs: 0.8 10*3/uL (ref 0.7–4.0)
MCH: 29.6 pg (ref 26.0–34.0)
MCHC: 32.2 g/dL (ref 30.0–36.0)
MCV: 91.9 fL (ref 80.0–100.0)
Monocytes Absolute: 0.9 10*3/uL (ref 0.1–1.0)
Monocytes Relative: 12 %
Neutro Abs: 5.4 10*3/uL (ref 1.7–7.7)
Neutrophils Relative %: 69 %
Platelet Count: 199 10*3/uL (ref 150–400)
RBC: 3.58 MIL/uL — ABNORMAL LOW (ref 3.87–5.11)
RDW: 14.6 % (ref 11.5–15.5)
WBC Count: 7.7 10*3/uL (ref 4.0–10.5)
nRBC: 0 % (ref 0.0–0.2)

## 2019-06-23 LAB — TOTAL PROTEIN, URINE DIPSTICK: Protein, ur: 300 mg/dL — AB

## 2019-06-23 LAB — FERRITIN: Ferritin: 69 ng/mL (ref 11–307)

## 2019-06-23 MED ORDER — HEPARIN SOD (PORK) LOCK FLUSH 100 UNIT/ML IV SOLN
500.0000 [IU] | Freq: Once | INTRAVENOUS | Status: AC | PRN
  Administered 2019-06-23: 500 [IU]
  Filled 2019-06-23: qty 5

## 2019-06-23 MED ORDER — SODIUM CHLORIDE 0.9% FLUSH
10.0000 mL | INTRAVENOUS | Status: DC | PRN
  Administered 2019-06-23: 10 mL
  Filled 2019-06-23: qty 10

## 2019-06-23 MED ORDER — SODIUM CHLORIDE 0.9 % IV SOLN
Freq: Once | INTRAVENOUS | Status: AC
  Filled 2019-06-23: qty 250

## 2019-06-23 MED ORDER — SODIUM CHLORIDE 0.9 % IV SOLN
4.6000 mg/kg | Freq: Once | INTRAVENOUS | Status: AC
  Administered 2019-06-23: 300 mg via INTRAVENOUS
  Filled 2019-06-23: qty 12

## 2019-06-23 NOTE — Patient Instructions (Signed)
[facility] Discharge Instructions for Patients Receiving Chemotherapy  Today you received the following chemotherapy agents Bevacizumab  To help prevent nausea and vomiting after your treatment, we encourage you to take your nausea medication as directed   If you develop nausea and vomiting that is not controlled by your nausea medication, call the clinic.   BELOW ARE SYMPTOMS THAT SHOULD BE REPORTED IMMEDIATELY:  *FEVER GREATER THAN 100.5 F  *CHILLS WITH OR WITHOUT FEVER  NAUSEA AND VOMITING THAT IS NOT CONTROLLED WITH YOUR NAUSEA MEDICATION  *UNUSUAL SHORTNESS OF BREATH  *UNUSUAL BRUISING OR BLEEDING  TENDERNESS IN MOUTH AND THROAT WITH OR WITHOUT PRESENCE OF ULCERS  *URINARY PROBLEMS  *BOWEL PROBLEMS  UNUSUAL RASH Items with * indicate a potential emergency and should be followed up as soon as possible.  Feel free to call the clinic should you have any questions or concerns. The clinic phone number is [phone].  Please show the CHEMO ALERT CARD at check-in to the Emergency Department and triage nurse.   

## 2019-06-23 NOTE — Progress Notes (Signed)
Per Dr. Burr Medico, [place] to treat with protein in urine.  Gardiner Rhyme, RN

## 2019-06-23 NOTE — Progress Notes (Signed)
Keep Mvasi dose at 300 mg today despite wt incr per Dr. Burr Medico, esp in setting of high urine protein.  Kennith Center, Pharm.D., CPP 06/23/2019@12 :13 PM

## 2019-06-26 ENCOUNTER — Other Ambulatory Visit: Payer: Self-pay

## 2019-06-26 ENCOUNTER — Telehealth: Payer: Self-pay | Admitting: Hematology

## 2019-06-26 ENCOUNTER — Other Ambulatory Visit (INDEPENDENT_AMBULATORY_CARE_PROVIDER_SITE_OTHER): Payer: Medicare Other

## 2019-06-26 ENCOUNTER — Telehealth: Payer: Self-pay

## 2019-06-26 DIAGNOSIS — Z794 Long term (current) use of insulin: Secondary | ICD-10-CM | POA: Diagnosis not present

## 2019-06-26 DIAGNOSIS — IMO0002 Reserved for concepts with insufficient information to code with codable children: Secondary | ICD-10-CM

## 2019-06-26 DIAGNOSIS — E1121 Type 2 diabetes mellitus with diabetic nephropathy: Secondary | ICD-10-CM | POA: Diagnosis not present

## 2019-06-26 DIAGNOSIS — E1165 Type 2 diabetes mellitus with hyperglycemia: Secondary | ICD-10-CM | POA: Diagnosis not present

## 2019-06-26 LAB — COMPREHENSIVE METABOLIC PANEL
ALT: 19 U/L (ref 0–35)
AST: 34 U/L (ref 0–37)
Albumin: 3.2 g/dL — ABNORMAL LOW (ref 3.5–5.2)
Alkaline Phosphatase: 258 U/L — ABNORMAL HIGH (ref 39–117)
BUN: 15 mg/dL (ref 6–23)
CO2: 28 mEq/L (ref 19–32)
Calcium: 8.9 mg/dL (ref 8.4–10.5)
Chloride: 108 mEq/L (ref 96–112)
Creatinine, Ser: 1.11 mg/dL (ref 0.40–1.20)
GFR: 57.33 mL/min — ABNORMAL LOW (ref >60.00)
Glucose, Bld: 141 mg/dL — ABNORMAL HIGH (ref 70–99)
Potassium: 4 mEq/L (ref 3.5–5.1)
Sodium: 143 mEq/L (ref 135–145)
Total Bilirubin: 0.8 mg/dL (ref 0.2–1.2)
Total Protein: 5.9 g/dL — ABNORMAL LOW (ref 6.0–8.3)

## 2019-06-26 LAB — HEMOGLOBIN A1C: Hgb A1c MFr Bld: 5.6 % (ref 4.6–6.5)

## 2019-06-26 NOTE — Telephone Encounter (Signed)
Received TC from patient's husband who states patient had an appt with her oncologist, Dr. Burr Medico last week and c/o bilateral leg pain.  He states Dr. Burr Medico recommended she f/u with her PCP.  Appt offered in [facility] tomorrow, husband declines, states she has an appt with her diabetic doctor tomorrow.  Appt made for Wednesday, 06/28/19 @ 3:15 w/ PCP. SChaplin, RN,BSN

## 2019-06-26 NOTE — Telephone Encounter (Signed)
Scheduled appt per 5/14 los.'  Spoke with pt spouse and he is aware of the appt date and time.

## 2019-06-27 ENCOUNTER — Ambulatory Visit (INDEPENDENT_AMBULATORY_CARE_PROVIDER_SITE_OTHER): Payer: Medicare Other | Admitting: Endocrinology

## 2019-06-27 ENCOUNTER — Encounter: Payer: Self-pay | Admitting: Endocrinology

## 2019-06-27 VITALS — BP 140/68 | HR 62 | Ht 63.0 in | Wt 161.0 lb

## 2019-06-27 DIAGNOSIS — E1165 Type 2 diabetes mellitus with hyperglycemia: Secondary | ICD-10-CM

## 2019-06-27 DIAGNOSIS — I10 Essential (primary) hypertension: Secondary | ICD-10-CM | POA: Diagnosis not present

## 2019-06-27 DIAGNOSIS — E1121 Type 2 diabetes mellitus with diabetic nephropathy: Secondary | ICD-10-CM

## 2019-06-27 DIAGNOSIS — Z794 Long term (current) use of insulin: Secondary | ICD-10-CM

## 2019-06-27 LAB — FRUCTOSAMINE: Fructosamine: 256 umol/L (ref 0–285)

## 2019-06-27 NOTE — Progress Notes (Signed)
Patient ID: [name], female   DOB: 1940-11-08, 79 y.o.   MRN: [id number]           Reason for Appointment: Follow-up for Type 2 Diabetes  Referring PCP: Koleen Distance   History of Present Illness:          Date of diagnosis of type 2 diabetes mellitus: 1988       Background history:   Patient has been on insulin for a few years and she is unclear when this was started, according to Epic she was started on Levemir in 2015 Prior to that likely had been on various oral hypoglycemic agents including metformin, glipizide and Tradjenta Her level of control has been somewhat variable but not consistently poor; her A1c has ranged between 5.6 and 7.6  Recent history:   Her A1c is 5.6, fructosamine 256  INSULIN regimen is:  [place] 10 units in the mornings daily, Humalog 5 units before meals      Non-insulin hypoglycemic drugs the patient is taking are: Trulicity 3 mg weekly  Current management, blood sugar patterns and problems identified:  She was advised to take Humalog only at suppertime for simplicity and better compliance  She is trying to do this much more regularly although she likely has missed an occasional dose  Also her TRESIBA was reduced by 5 units down to 10 units because of tendency to blood sugars as low as 30 3 in the morning  With this she has not had any overnight hypoglycemia  However she may have taken Humalog once before breakfast when glucose was 183 causing her blood sugar to go down  Otherwise blood sugars are generally averaging below 150 during the daytime  Again HIGHEST blood sugars are still after dinner at night overall  With increasing TRULICITY to 3 mg she appears to have had more stable blood sugars although still has some variability after meals  Her weight appears to have gone up but not clear if this is accurate  She is trying to check her sugars several times during the day and may be running out of the strips at times        Side  effects from medications have been: None known     Meal times are:  Breakfast is at variable times between 8 AM-9  Dinner 7 pm  Typical meal intake: Breakfast is Cheerios or raisin Bran               Exercise:  Unable to do any  Glucose monitoring:  Checking up to 4 times a day       Glucometer: One Probation officer.       Blood Glucose readings by download of meter   PRE-MEAL Fasting Lunch Dinner Bedtime Overall  Glucose range:  106-208  56-145  115-231  111-412   Mean/median:  146   134  205 155   Previous readings:   PRE-MEAL Fasting Lunch Dinner Bedtime Overall  Glucose range:  33-199  59-242  79-335  149-341   Mean/median:  100  118  270  248  194     Dietician visit, most recent: 11/2018  Weight history:  Wt Readings from Last 3 Encounters:  06/27/19 161 lb (73 kg)  06/23/19 161 lb 8 oz (73.3 kg)  05/16/19 151 lb 12.8 oz (68.9 kg)    Glycemic control:   Lab Results  Component Value Date   HGBA1C 5.6 06/26/2019   HGBA1C 6.1 (A) 05/16/2019   HGBA1C 6.9 (  A) 01/30/2019   Lab Results  Component Value Date   MICROALBUR 1,289.4 (H) 03/07/2019   LDLCALC 57 01/30/2019   CREATININE 1.11 06/26/2019   Lab Results  Component Value Date   MICRALBCREAT 5 03/28/2018    Lab Results  Component Value Date   FRUCTOSAMINE 256 06/26/2019    Lab on 06/26/2019  Component Date Value Ref Range Status  . Fructosamine 06/26/2019 256  0 - 285 umol/L Final   Comment: Published reference interval for apparently healthy subjects between age 79 and 63 is 63 - 285 umol/L and in a poorly controlled diabetic population is 228 - 563 umol/L with a mean of 396 umol/L.   Marland Kitchen Sodium 06/26/2019 143  135 - 145 mEq/L Final  . Potassium 06/26/2019 4.0  3.5 - 5.1 mEq/L Final  . Chloride 06/26/2019 108  96 - 112 mEq/L Final  . CO2 06/26/2019 28  19 - 32 mEq/L Final  . Glucose, Bld 06/26/2019 141* 70 - 99 mg/dL Final  . BUN 06/26/2019 15  6 - 23 mg/dL Final  . Creatinine, Ser 06/26/2019  1.11  0.40 - 1.20 mg/dL Final  . Total Bilirubin 06/26/2019 0.8  0.2 - 1.2 mg/dL Final  . Alkaline Phosphatase 06/26/2019 258* 39 - 117 U/L Final  . AST 06/26/2019 34  0 - 37 U/L Final  . ALT 06/26/2019 19  0 - 35 U/L Final  . Total Protein 06/26/2019 5.9* 6.0 - 8.3 g/dL Final  . Albumin 06/26/2019 3.2* 3.5 - 5.2 g/dL Final  . GFR 06/26/2019 57.33* >60.00 mL/min Final  . Calcium 06/26/2019 8.9  8.4 - 10.5 mg/dL Final  . Hgb A1c MFr Bld 06/26/2019 5.6  4.6 - 6.5 % Final   Glycemic Control Guidelines for People with Diabetes:Non Diabetic:  <6%Goal of Therapy: <7%Additional Action Suggested:  >8%   Appointment on 06/23/2019  Component Date Value Ref Range Status  . WBC Count 06/23/2019 7.7  4.0 - 10.5 K/uL Final  . RBC 06/23/2019 3.58* 3.87 - 5.11 MIL/uL Final  . Hemoglobin 06/23/2019 10.6* 12.0 - 15.0 g/dL Final  . HCT 06/23/2019 32.9* 36.0 - 46.0 % Final  . MCV 06/23/2019 91.9  80.0 - 100.0 fL Final  . MCH 06/23/2019 29.6  26.0 - 34.0 pg Final  . MCHC 06/23/2019 32.2  30.0 - 36.0 g/dL Final  . RDW 06/23/2019 14.6  11.5 - 15.5 % Final  . Platelet Count 06/23/2019 199  150 - 400 K/uL Final  . nRBC 06/23/2019 0.0  0.0 - 0.2 % Final  . Neutrophils Relative % 06/23/2019 69  % Final  . Neutro Abs 06/23/2019 5.4  1.7 - 7.7 K/uL Final  . Lymphocytes Relative 06/23/2019 11  % Final  . Lymphs Abs 06/23/2019 0.8  0.7 - 4.0 K/uL Final  . Monocytes Relative 06/23/2019 12  % Final  . Monocytes Absolute 06/23/2019 0.9  0.1 - 1.0 K/uL Final  . Eosinophils Relative 06/23/2019 6  % Final  . Eosinophils Absolute 06/23/2019 0.5  0.0 - 0.5 K/uL Final  . Basophils Relative 06/23/2019 1  % Final  . Basophils Absolute 06/23/2019 0.0  0.0 - 0.1 K/uL Final  . Immature Granulocytes 06/23/2019 1  % Final  . Abs Immature Granulocytes 06/23/2019 0.05  0.00 - 0.07 K/uL Final   Performed at [facility], [place] [address], [place], [place] [address]  . Ferritin 06/23/2019 69  11 - 307 ng/mL  Final   Performed at [facility], [address] [place], [place] [address]  . Protein, ur 06/23/2019 300* NEGATIVE mg/dL Final   Performed at [facility], [place] [address] [place], [place] [address]    Allergies as of 06/27/2019      Reactions   Aspirin Nausea And Vomiting      Medication List       Accurate as of Jun 27, 2019  3:33 PM. If you have any questions, ask your nurse or doctor.        amLODipine 10 MG tablet Commonly known as: NORVASC Take 1 tablet (10 mg total) by mouth daily.   b complex vitamins capsule Take 1 capsule by mouth daily.   B-D UF III MINI PEN NEEDLES 31G X 5 MM Misc Generic drug: Insulin Pen Needle USE TO INJECT INSULIN 3 TIMES A DAY   calcium-vitamin D 500-200 MG-UNIT tablet Commonly known as: OSCAL WITH D Take 1 tablet by mouth 2 (two) times daily.   cetaphil lotion Apply 1 application topically as needed for dry skin.   donepezil 10 MG tablet Commonly known as: ARICEPT Take 1 tablet (10 mg total) by mouth at bedtime.   Ferrocite 324 (106 Fe) MG Tabs tablet Generic drug: Ferrous Fumarate Take 1 tablet by mouth twice daily   furosemide 20 MG tablet Commonly known as: LASIX Take 1 tablet (20 mg total) by mouth daily.   lactulose 10 GM/15ML solution Commonly known as: CHRONULAC TAKE 15 ML BY MOUTH TWO TIMES DAILY   levETIRAcetam 500 MG tablet Commonly known as: KEPPRA Take 1 tablet (500 mg total) by mouth 2 (two) times daily.   Medical Compression Stockings Misc Wear as much as possible while awake to reduce swelling   memantine 10 MG tablet Commonly known as: NAMENDA Take 1 tablet (10 mg total) by mouth 2 (two) times daily.   OneTouch Delica Lancets Fine Misc Check blood sugar up to 4 times a day   OneTouch Verio test strip Generic drug: glucose blood Use as instructed 4 times daily. E11.29, insulin requiring   OneTouch Verio w/Device Kit Use to check blood sugar up to  4 times a day   oxycodone-acetaminophen 2.5-325 MG tablet Commonly known as: PERCOCET Take 1 tablet by mouth at bedtime as needed for pain.   oxymetazoline 0.05 % nasal spray Commonly known as: AFRIN Place 1 spray into both nostrils 2 (two) times daily as needed (Epistaxis).   pantoprazole 40 MG tablet Commonly known as: PROTONIX Take 1 tablet by mouth once daily   sodium chloride 0.65 % Soln nasal spray Commonly known as: OCEAN Place 1 spray into both nostrils as needed for congestion.   Tyler Aas FlexTouch 100 UNIT/ML FlexTouch Pen Generic drug: insulin degludec INJECT 21 UNITS SUBCUTANEOUSLY IN THE EVENING   triamcinolone ointment 0.5 % Commonly known as: KENALOG APPLY 1 APPLICATION TOPICALLY TO RASH TWICE DAILY FOR ITCHING What changed:   how much to take  how to take this  when to take this  reasons to take this  additional instructions   Trulicity 3 BP/1.0CH Sopn Generic drug: Dulaglutide Inject 3 mg into the skin once a week.       Allergies:  Allergies  Allergen Reactions  . Aspirin Nausea And Vomiting    Past Medical History:  Diagnosis Date  . Chronic anemia   . Chronic diastolic CHF (congestive heart failure) ([place]) 10/03/2013  . Chronic GI bleeding    [name] 11/29/2014  . Family history of anesthesia complication    "niece  has a hard time coming out" (09/15/2012)  . Frequent nosebleeds    chronic  . Gastric AV malformation    [name] 11/29/2014  . GERD (gastroesophageal reflux disease)   . 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 History of blood transfusion "several"  . History of epistaxis   . HTN (hypertension), benign 03/02/2012  . Hyperlipidemia   . Iron deficiency anemia    chronic infusions"  . Lichen planus    Both lower extremities  . 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 Overgrown toenails 03/18/2017  . Pneumonia 1990's X 2  . Pulmonary HTN ([place]) 04/01/2017   PASP 87mHg on echo 09/2016 and  426mg by echo 2019  . Seizures ([facility]/2016  . Symptomatic anemia 11/29/2014  . Telangiectasia    Gastric   . 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    Past Surgical History:  Procedure Laterality Date  . CATARACT EXTRACTION     "I think it was just one eye"  . ESOPHAGOGASTRODUODENOSCOPY  02/26/2011   Procedure: ESOPHAGOGASTRODUODENOSCOPY (EGD);  Surgeon: [name];  [facility];  Service: Endoscopy;  Laterality: N/A;  . ESOPHAGOGASTRODUODENOSCOPY N/A 11/08/2012   Procedure: ESOPHAGOGASTRODUODENOSCOPY (EGD);  Surgeon: [name];  [facility];  Service: Endoscopy;  Laterality: N/A;  . ESOPHAGOGASTRODUODENOSCOPY N/A 10/04/2013   Procedure: ESOPHAGOGASTRODUODENOSCOPY (EGD);  Surgeon: [name];  [facility];  Service: Endoscopy;  Laterality: N/A;  with APC on stand-by  . ESOPHAGOGASTRODUODENOSCOPY N/A 07/06/2014   Procedure: ESOPHAGOGASTRODUODENOSCOPY (EGD);  Surgeon: [name];  [facility];  Service: Endoscopy;  Laterality: N/A;  . ESOPHAGOGASTRODUODENOSCOPY N/A 09/05/2014   Procedure: ESOPHAGOGASTRODUODENOSCOPY (EGD);  Surgeon: JaLaurence SpatesMD;  [facility];  Service: Endoscopy;  Laterality: N/A;  APC on standby to control bleeding  . ESOPHAGOGASTRODUODENOSCOPY N/A 11/29/2014   Procedure: ESOPHAGOGASTRODUODENOSCOPY (EGD);  Surgeon: [name];  [facility];  Service: Endoscopy;  Laterality: N/A;  . ESOPHAGOGASTRODUODENOSCOPY N/A 09/28/2015   Procedure: ESOPHAGOGASTRODUODENOSCOPY (EGD);  Surgeon: [name];  [facility];  Service: Endoscopy;  Laterality: N/A;  . ESOPHAGOGASTRODUODENOSCOPY (EGD) WITH PROPOFOL N/A 12/04/2017   Procedure: ESOPHAGOGASTRODUODENOSCOPY (EGD) WITH PROPOFOL;  Surgeon: [name];  [facility]: [facility];  Laterality: N/A;  . ESOPHAGOGASTRODUODENOSCOPY ENDOSCOPY  08/19/2006   with laser treatment   . HOT HEMOSTASIS N/A 07/06/2014   Procedure: HOT HEMOSTASIS (ARGON PLASMA COAGULATION/BICAP);  Surgeon: [name];  [facility];  Service: Endoscopy;  Laterality: N/A;  . HOT HEMOSTASIS N/A 09/28/2015   Procedure: HOT HEMOSTASIS (ARGON PLASMA COAGULATION/BICAP);  Surgeon: [name];  [facility];  Service: Endoscopy;  Laterality: N/A;  . HOT HEMOSTASIS N/A 12/04/2017   Procedure: HOT HEMOSTASIS (ARGON PLASMA COAGULATION/BICAP);  Surgeon: [name];  [facility] Service: Endoscopy;  Laterality: N/A;  . NASAL HEMORRHAGE CONTROL     "for bleeding"   . SAVORY DILATION  02/26/2011   Procedure: SAVORY DILATION;  Surgeon: [name];  [name];  Service: Endoscopy;  Laterality: N/A;  c-arm needed  . SUBMUCOSAL INJECTION  12/04/2017   Procedure: SUBMUCOSAL INJECTION;  Surgeon: [name];  [facility];  Service: Endoscopy;;    Family History  Problem Relation Age of Onset  . Stroke Father   . Healthy Mother   . Breast cancer Other   . Diabetes Niece   .  Malignant hyperthermia Neg Hx   . Seizures Neg Hx     Social History:  reports that she quit smoking about 48 years ago. Her smoking use included cigarettes. She has a 20.00 pack-year smoking history. She has never used smokeless tobacco. She reports that she does not drink alcohol or use drugs.   Review of Systems  Hematology: She has had history of iron deficiency anemia requiring infusions every 1 to 2 months  Lab Results  Component Value Date   HGB 10.6 (L) 06/23/2019     Lipid history: No increase in LDL as of the last labs:    Lab Results  Component Value Date   CHOL 133 01/30/2019   HDL 41.30 01/30/2019   LDLCALC 57 01/30/2019   TRIG 176.0 (H) 01/30/2019   CHOLHDL 3 01/30/2019           Hypertension: Now taking amlodipine from her PCP  BP Readings from Last 3 Encounters:  06/27/19 140/68  06/23/19 (!) 159/56  05/26/19  (!) 154/54    Most recent eye exam was in 07/2018  Most recent foot exam: 06/2018 by podiatrist with no evidence of sensory loss or reduction in sensation   Complications of diabetes: Reportedly has retinopathy, currently without a foot exam not clear if she has neuropathy.  Has history of microalbuminuria  LABS:  Lab on 06/26/2019  Component Date Value Ref Range Status  . Fructosamine 06/26/2019 256  0 - 285 umol/L Final   Comment: Published reference interval for apparently healthy subjects between age 72 and 23 is 76 - 285 umol/L and in a poorly controlled diabetic population is 228 - 563 umol/L with a mean of 396 umol/L.   Marland Kitchen Sodium 06/26/2019 143  135 - 145 mEq/L Final  . Potassium 06/26/2019 4.0  3.5 - 5.1 mEq/L Final  . Chloride 06/26/2019 108  96 - 112 mEq/L Final  . CO2 06/26/2019 28  19 - 32 mEq/L Final  . Glucose, Bld 06/26/2019 141* 70 - 99 mg/dL Final  . BUN 06/26/2019 15  6 - 23 mg/dL Final  . Creatinine, Ser 06/26/2019 1.11  0.40 - 1.20 mg/dL Final  . Total Bilirubin 06/26/2019 0.8  0.2 - 1.2 mg/dL Final  . Alkaline Phosphatase 06/26/2019 258* 39 - 117 U/L Final  . AST 06/26/2019 34  0 - 37 U/L Final  . ALT 06/26/2019 19  0 - 35 U/L Final  . Total Protein 06/26/2019 5.9* 6.0 - 8.3 g/dL Final  . Albumin 06/26/2019 3.2* 3.5 - 5.2 g/dL Final  . GFR 06/26/2019 57.33* >60.00 mL/min Final  . Calcium 06/26/2019 8.9  8.4 - 10.5 mg/dL Final  . Hgb A1c MFr Bld 06/26/2019 5.6  4.6 - 6.5 % Final   Glycemic Control Guidelines for People with Diabetes:Non Diabetic:  <6%Goal of Therapy: <7%Additional Action Suggested:  >8%   Appointment on 06/23/2019  Component Date Value Ref Range Status  . WBC Count 06/23/2019 7.7  4.0 - 10.5 K/uL Final  . RBC 06/23/2019 3.58* 3.87 - 5.11 MIL/uL Final  . Hemoglobin 06/23/2019 10.6* 12.0 - 15.0 g/dL Final  . HCT 06/23/2019 32.9* 36.0 - 46.0 % Final  . MCV 06/23/2019 91.9  80.0 - 100.0 fL Final  . MCH 06/23/2019 29.6  26.0 - 34.0 pg Final    . MCHC 06/23/2019 32.2  30.0 - 36.0 g/dL Final  . RDW 06/23/2019 14.6  11.5 - 15.5 % Final  . Platelet Count 06/23/2019 199  150 - 400 K/uL Final  .  nRBC 06/23/2019 0.0  0.0 - 0.2 % Final  . Neutrophils Relative % 06/23/2019 69  % Final  . Neutro Abs 06/23/2019 5.4  1.7 - 7.7 K/uL Final  . Lymphocytes Relative 06/23/2019 11  % Final  . Lymphs Abs 06/23/2019 0.8  0.7 - 4.0 K/uL Final  . Monocytes Relative 06/23/2019 12  % Final  . Monocytes Absolute 06/23/2019 0.9  0.1 - 1.0 K/uL Final  . Eosinophils Relative 06/23/2019 6  % Final  . Eosinophils Absolute 06/23/2019 0.5  0.0 - 0.5 K/uL Final  . Basophils Relative 06/23/2019 1  % Final  . Basophils Absolute 06/23/2019 0.0  0.0 - 0.1 K/uL Final  . Immature Granulocytes 06/23/2019 1  % Final  . Abs Immature Granulocytes 06/23/2019 0.05  0.00 - 0.07 K/uL Final   Performed at [facility], [place] [address], [place], [place] [address]  . Ferritin 06/23/2019 69  11 - 307 ng/mL Final   Performed at [facility], [place] [address] [place], [place] [address]  . Protein, ur 06/23/2019 300* NEGATIVE mg/dL Final   Performed at [facility], [place] [address], [place], [place] [address]    Physical Examination:  BP 140/68 (BP Location: Left Arm, Patient Position: Sitting, Cuff Size: Normal)   Pulse 62   Ht '5\' 3"'$  (1.6 m)   Wt 161 lb (73 kg)   LMP  (LMP Unknown)   SpO2 97%   BMI 28.52 kg/m    ASSESSMENT:  Diabetes type 2 on insulin  See history of present illness for detailed discussion of current diabetes management, blood sugar patterns and problems identified  Her last A1c was 6.1 and is now 95.6  Fructosamine is 017  She is on Trulicity 3 mg, Humalog only at suppertime and [place] 10 units now  Although she was previously recommended mealtime dose with Humalog consistently she has been somewhat confused about how to do it With having her take it only at suppertime  which is her main meal compliance is improved This is not causing hypoglycemia Also with reducing her basal insulin down to 10 units she did not have any more hypoglycemia overnight Also likely benefiting from higher dose of Trulicity  HYPERTENSION: Consistently well controlled  PLAN:    Stay on Trulicity 3 mg weekly No change in [place] Although she likely needs more Humalog and needs to adjusted based on her meal size she will try 6 units for now Since her fasting readings are fairly good she can likely check her blood sugars in the mornings on waking up every other day only Does not need to check her sugar several times during the day, 3-4 times is more than enough May also consider freestyle libre Avoid Humalog insulin at breakfast or lunch Her husband was present in the office and he will help her with compliance and she cannot remember herself  Continue to assess blood sugars with fructosamine along with A1c She will call if she has any consistently high or low readings   Patient Instructions  Humalog 6 units at supper  Check blood sugars on waking up 4 days a week  Also check blood sugars about 2 hours after meals and do this after different meals by rotation  Recommended blood sugar levels on waking up are 90-130 and about 2 hours after meal is 130-180  Please bring your blood sugar monitor to each visit, thank you  Elayne Snare 06/27/2019, 3:33 PM   Note: This office note was prepared with Dragon voice recognition system technology. Any transcriptional errors that result from this process are unintentional.

## 2019-06-27 NOTE — Patient Instructions (Addendum)
Humalog 6 units at supper  Check blood sugars on waking up 4 days a week  Also check blood sugars about 2 hours after meals and do this after different meals by rotation  Recommended blood sugar levels on waking up are 90-130 and about 2 hours after meal is 130-180  Please bring your blood sugar monitor to each visit, thank you

## 2019-06-28 ENCOUNTER — Encounter: Payer: Self-pay | Admitting: Internal Medicine

## 2019-06-28 ENCOUNTER — Ambulatory Visit (INDEPENDENT_AMBULATORY_CARE_PROVIDER_SITE_OTHER): Payer: Medicare Other | Admitting: Internal Medicine

## 2019-06-28 ENCOUNTER — Other Ambulatory Visit: Payer: Self-pay

## 2019-06-28 VITALS — BP 138/50 | HR 65 | Temp 99.6°F | Wt 153.2 lb

## 2019-06-28 DIAGNOSIS — I10 Essential (primary) hypertension: Secondary | ICD-10-CM | POA: Diagnosis not present

## 2019-06-28 DIAGNOSIS — I739 Peripheral vascular disease, unspecified: Secondary | ICD-10-CM | POA: Diagnosis not present

## 2019-06-28 DIAGNOSIS — L439 Lichen planus, unspecified: Secondary | ICD-10-CM

## 2019-06-28 NOTE — Patient Instructions (Signed)
Ms. Pund, It was nice seeing you. Please pick up the prescription for your leg pain that I have refilled.   I'm referring you back to the vascular surgeon just to talk through any other treatment options.  I've also sent in the referral to dermatology.   Take care! Dr. Koleen Distance

## 2019-06-29 ENCOUNTER — Encounter: Payer: Self-pay | Admitting: Internal Medicine

## 2019-06-29 NOTE — Assessment & Plan Note (Signed)
Patient's blood pressure has improved since increasing Amlodipine to 10 mg 3 months ago. Tolerating medication well. Will continue current therapy.

## 2019-06-29 NOTE — Assessment & Plan Note (Signed)
She was diagnosed with lichen planus by dermatology back in 2017. Notes worsening symptoms recently despite topical triamcinolone. Will place new referral to dermatology for further evaluation and treatment.

## 2019-06-29 NOTE — Progress Notes (Signed)
Internal Medicine Clinic Attending  Case discussed with Dr. Bloomfield at the time of the visit.  We reviewed the resident's history and exam and pertinent patient test results.  I agree with the assessment, diagnosis, and plan of care documented in the resident's note.  

## 2019-06-29 NOTE — Addendum Note (Signed)
Addended by: Lalla Brothers T on: 06/29/2019 02:34 PM   Modules accepted: Level of Service

## 2019-06-29 NOTE — Progress Notes (Signed)
Established Patient Office Visit  Subjective:  Patient ID: [name], female    DOB: 07-30-1940  Age: 79 y.o. MRN: [id number]  CC:  Chief Complaint  Patient presents with  . Leg Pain    pt c/o bilateral leg pain for about 1 year    HPI [name] presents for follow-up on chronic leg pain. Please see problem based charting for further details.   Past Medical History:  Diagnosis Date  . Chronic anemia   . Chronic diastolic CHF (congestive heart failure) ([place]) 10/03/2013  . Chronic GI bleeding    [name] 11/29/2014  . Family history of anesthesia complication    "niece has a hard time coming out" (09/15/2012)  . Frequent nosebleeds    chronic  . Gastric AV malformation    [name] 11/29/2014  . GERD (gastroesophageal reflux disease)   . 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 History of blood transfusion "several"  . History of epistaxis   . HTN (hypertension), benign 03/02/2012  . Hyperlipidemia   . Iron deficiency anemia    chronic infusions"  . Lichen planus    Both lower extremities  . 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 Overgrown toenails 03/18/2017  . Pneumonia 1990's X 2  . Pulmonary HTN ([place]) 04/01/2017   PASP 53mHg on echo 09/2016 and 458mg by echo 2019  . [place]  . Symptomatic anemia 11/29/2014  . Telangiectasia    Gastric   . 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    Past Surgical History:  Procedure Laterality Date  . CATARACT EXTRACTION     "I think it was just one eye"  . ESOPHAGOGASTRODUODENOSCOPY  02/26/2011   Procedure: ESOPHAGOGASTRODUODENOSCOPY (EGD);  Surgeon: [name];  [facility];  Service: Endoscopy;  Laterality: N/A;  . ESOPHAGOGASTRODUODENOSCOPY N/A 11/08/2012   Procedure: ESOPHAGOGASTRODUODENOSCOPY (EGD);  Surgeon: [name];  [facility];  Service: Endoscopy;  Laterality: N/A;  . ESOPHAGOGASTRODUODENOSCOPY N/A 10/04/2013   Procedure: ESOPHAGOGASTRODUODENOSCOPY (EGD);  Surgeon: [name];  [facility];  Service: Endoscopy;  Laterality: N/A;  with APC on stand-by  . ESOPHAGOGASTRODUODENOSCOPY N/A 07/06/2014   Procedure: ESOPHAGOGASTRODUODENOSCOPY (EGD);  Surgeon: [name];  [facility];  Service: Endoscopy;  Laterality: N/A;  . ESOPHAGOGASTRODUODENOSCOPY N/A 09/05/2014   Procedure: ESOPHAGOGASTRODUODENOSCOPY (EGD);  Surgeon: JaLaurence SpatesMD;  [facility];  Service: Endoscopy;  Laterality: N/A;  APC on standby to control bleeding  . ESOPHAGOGASTRODUODENOSCOPY N/A 11/29/2014   Procedure: ESOPHAGOGASTRODUODENOSCOPY (EGD);  Surgeon: [name];  [facility];  Service: Endoscopy;  Laterality: N/A;  . ESOPHAGOGASTRODUODENOSCOPY N/A 09/28/2015   Procedure: ESOPHAGOGASTRODUODENOSCOPY (EGD);  Surgeon: [name];  [facility];  Service: Endoscopy;  Laterality: N/A;  . ESOPHAGOGASTRODUODENOSCOPY (EGD) WITH PROPOFOL N/A 12/04/2017   Procedure: ESOPHAGOGASTRODUODENOSCOPY (EGD) WITH PROPOFOL;  Surgeon: [name];  [facility];  Laterality: N/A;  . ESOPHAGOGASTRODUODENOSCOPY ENDOSCOPY  08/19/2006   with laser treatment  . HOT HEMOSTASIS N/A 07/06/2014   Procedure: HOT HEMOSTASIS (ARGON PLASMA COAGULATION/BICAP);  Surgeon: [name];  [facility];  Service: Endoscopy;  Laterality: N/A;  . HOT HEMOSTASIS N/A 09/28/2015   Procedure: HOT HEMOSTASIS (ARGON PLASMA COAGULATION/BICAP);  Surgeon: [name];  [facility];  Service: Endoscopy;  Laterality: N/A;  . HOT HEMOSTASIS N/A 12/04/2017   Procedure: HOT HEMOSTASIS (ARGON PLASMA  COAGULATION/BICAP);  Surgeon: [name], MD;  Location: [facility];  Service: Endoscopy;  Laterality: N/A;  . NASAL HEMORRHAGE CONTROL     "for bleeding"   . SAVORY DILATION  02/26/2011   Procedure: SAVORY DILATION;  Surgeon: [name],  MD;  [name];  Service: Endoscopy;  Laterality: N/A;  c-arm needed  . SUBMUCOSAL INJECTION  12/04/2017   Procedure: SUBMUCOSAL INJECTION;  Surgeon: [name], MD;  [facility];  Service: Endoscopy;;    Family History  Problem Relation Age of Onset  . Stroke Father   . Healthy Mother   . Breast cancer Other   . Diabetes Niece   . Malignant hyperthermia Neg Hx   . Seizures Neg Hx     Social History   Socioeconomic History  . Marital status: Married    Spouse name: [name]  . Number of children: 0  . Years of education: 9  . Highest education level: Not on file  Occupational History  . Occupation: Retired Systems developer: RETIRED  Tobacco Use  . Smoking status: Former Smoker    Packs/day: 1.00    Years: 20.00    Pack years: 20.00    Types: Cigarettes    Quit date: 02/10/1971    Years since quitting: 48.4  . Smokeless tobacco: Never Used  . Tobacco comment: 09/15/2012 "smoked 50-60 yr ago"  Substance and Sexual Activity  . Alcohol use: No    Alcohol/week: 0.0 standard drinks  . Drug use: No  . Sexual activity: Not on file  Other Topics Concern  . Not on file  Social History Narrative   Married, lives with husband, [name].  Ambulates without assistance.     Caffeine use: none   Social Determinants of Radio broadcast assistant Strain:   . Difficulty of Paying Living Expenses:   Food Insecurity:   . Worried About Charity fundraiser in the Last Year:   . Arboriculturist in the Last Year:   Transportation Needs:   . Film/video editor (Medical):   Marland Kitchen Lack of Transportation (Non-Medical):   Physical Activity:   . Days of Exercise per Week:   . Minutes of Exercise per Session:   Stress:   . Feeling of Stress :   Social Connections:   . Frequency of Communication with Friends and Family:   . Frequency of Social Gatherings with Friends and Family:   . Attends Religious Services:   . Active Member of Clubs or Organizations:    . Attends Archivist Meetings:   Marland Kitchen Marital Status:   Intimate Partner Violence:   . Fear of Current or Ex-Partner:   . Emotionally Abused:   Marland Kitchen Physically Abused:   . Sexually Abused:     Outpatient Medications Prior to Visit  Medication Sig Dispense Refill  . amLODipine (NORVASC) 10 MG tablet Take 1 tablet (10 mg total) by mouth daily. 30 tablet 2  . b complex vitamins capsule Take 1 capsule by mouth daily. 100 capsule 2  . Blood Glucose Monitoring Suppl (ONETOUCH VERIO) w/Device KIT Use to check blood sugar up to 4 times a day 1 kit 1  . calcium-vitamin D (OSCAL WITH D) 500-200 MG-UNIT tablet Take 1 tablet by mouth 2 (two) times daily.    . cetaphil (CETAPHIL) lotion Apply 1 application topically as needed for dry skin. 236 mL 0  . donepezil (ARICEPT) 10 MG tablet Take 1 tablet (10 mg total) by  mouth at bedtime. 90 tablet 4  . Dulaglutide (TRULICITY) 3 IY/6.4BR SOPN Inject 3 mg into the skin once a week. 4 pen 2  . Elastic Bandages & Supports (MEDICAL COMPRESSION STOCKINGS) MISC Wear as much as possible while awake to reduce swelling 2 each 0  . FERROCITE 324 MG TABS tablet Take 1 tablet by mouth twice daily 60 tablet 5  . furosemide (LASIX) 20 MG tablet Take 1 tablet (20 mg total) by mouth daily. 90 tablet 3  . glucose blood (ONETOUCH VERIO) test strip Use as instructed 4 times daily. E11.29, insulin requiring 350 each 3  . Insulin Pen Needle (B-D UF III MINI PEN NEEDLES) 31G X 5 MM MISC USE TO INJECT INSULIN 3 TIMES A DAY 100 each 0  . lactulose (CHRONULAC) 10 GM/15ML solution TAKE 15 ML BY MOUTH TWO TIMES DAILY 473 mL 2  . levETIRAcetam (KEPPRA) 500 MG tablet Take 1 tablet (500 mg total) by mouth 2 (two) times daily. 180 tablet 4  . memantine (NAMENDA) 10 MG tablet Take 1 tablet (10 mg total) by mouth 2 (two) times daily. 180 tablet 4  . ONETOUCH DELICA LANCETS FINE MISC Check blood sugar up to 4 times a day 300 each 4  . oxycodone-acetaminophen (PERCOCET) 2.5-325 MG  tablet Take 1 tablet by mouth at bedtime as needed for pain. 30 tablet 0  . oxymetazoline (AFRIN) 0.05 % nasal spray Place 1 spray into both nostrils 2 (two) times daily as needed (Epistaxis). 30 mL 0  . pantoprazole (PROTONIX) 40 MG tablet Take 1 tablet by mouth once daily 180 tablet 0  . sodium chloride (OCEAN) 0.65 % SOLN nasal spray Place 1 spray into both nostrils as needed for congestion. 60 mL 0  . TRESIBA FLEXTOUCH 100 UNIT/ML FlexTouch Pen INJECT 21 UNITS SUBCUTANEOUSLY IN THE EVENING 15 mL 0  . triamcinolone ointment (KENALOG) 0.5 % APPLY 1 APPLICATION TOPICALLY TO RASH TWICE DAILY FOR ITCHING (Patient taking differently: Apply 1 application topically 2 (two) times daily as needed (itching). ) 15 g 1   No facility-administered medications prior to visit.    Allergies  Allergen Reactions  . Aspirin Nausea And Vomiting    ROS Review of Systems  Constitutional: Positive for activity change. Negative for chills, fatigue and unexpected weight change.  HENT: Negative for nosebleeds.   Eyes: Negative for visual disturbance.  Respiratory: Negative for cough and shortness of breath.   Cardiovascular: Negative for chest pain and palpitations.  Gastrointestinal: Positive for constipation. Negative for blood in stool.  Genitourinary: Negative for dysuria.  Musculoskeletal: Positive for arthralgias.  Skin: Positive for rash.  Neurological: Negative for dizziness, syncope, numbness and headaches.  Psychiatric/Behavioral: Negative for agitation and behavioral problems.      Objective:    Physical Exam  Constitutional: She is oriented to person, place, and time. She appears well-developed and well-nourished. No distress.  Eyes: Conjunctivae are normal.  Neck: No thyromegaly present.  Cardiovascular: Normal rate and regular rhythm.  Murmur heard. Pulmonary/Chest: Effort normal and breath sounds normal.  Abdominal: Soft. She exhibits no distension. There is no abdominal tenderness.    Musculoskeletal:        General: No edema.     Cervical back: Neck supple.  Neurological: She is alert and oriented to person, place, and time.  Skin:  Diffuse skin changes from chronic lichen planus. No open wounds.   Psychiatric: She has a normal mood and affect. Her behavior is normal.    BP (!) 138/50 (BP  Location: Left Arm, Patient Position: Sitting, Cuff Size: Normal)   Pulse 65   Temp 99.6 F (37.6 C) (Oral)   Wt 153 lb 3.2 oz (69.5 kg)   LMP  (LMP Unknown)   SpO2 99%   BMI 27.14 kg/m  Wt Readings from Last 3 Encounters:  06/28/19 153 lb 3.2 oz (69.5 kg)  06/27/19 161 lb (73 kg)  06/23/19 161 lb 8 oz (73.3 kg)     Health Maintenance Due  Topic Date Due  . COVID-19 Vaccine (1) Never done  . PNA vac Low Risk Adult (1 of 2 - PCV13) 03/16/2005  . TETANUS/TDAP  02/09/2013  . MAMMOGRAM  01/13/2019  . FOOT EXAM  04/09/2019    There are no preventive care reminders to display for this patient.  Lab Results  Component Value Date   TSH 1.730 01/30/2015   Lab Results  Component Value Date   WBC 7.7 06/23/2019   HGB 10.6 (L) 06/23/2019   HCT 32.9 (L) 06/23/2019   MCV 91.9 06/23/2019   PLT 199 06/23/2019   Lab Results  Component Value Date   NA 143 06/26/2019   K 4.0 06/26/2019   CHLORIDE 109 04/04/2014   CO2 28 06/26/2019   GLUCOSE 141 (H) 06/26/2019   BUN 15 06/26/2019   CREATININE 1.11 06/26/2019   BILITOT 0.8 06/26/2019   ALKPHOS 258 (H) 06/26/2019   AST 34 06/26/2019   ALT 19 06/26/2019   PROT 5.9 (L) 06/26/2019   ALBUMIN 3.2 (L) 06/26/2019   CALCIUM 8.9 06/26/2019   ANIONGAP 7 05/26/2019   EGFR 76 (L) 04/04/2014   GFR 57.33 (L) 06/26/2019   Lab Results  Component Value Date   CHOL 133 01/30/2019   Lab Results  Component Value Date   HDL 41.30 01/30/2019   Lab Results  Component Value Date   LDLCALC 57 01/30/2019   Lab Results  Component Value Date   TRIG 176.0 (H) 01/30/2019   Lab Results  Component Value Date   CHOLHDL 3  01/30/2019   Lab Results  Component Value Date   HGBA1C 5.6 06/26/2019      Assessment & Plan:   Problem List Items Addressed This Visit      Cardiovascular and Mediastinum   HTN (hypertension), benign    Patient's blood pressure has improved since increasing Amlodipine to 10 mg 3 months ago. Tolerating medication well. Will continue current therapy.       Peripheral vascular disease ([place])    [name] presents with worsening pain in her bilateral lower extremities in the setting of known PAD. It has become significantly more function limiting. She and her husband agree that she could not walk more than 15-20 feet without having to sit down. She was seen by vascular a [place] over a year ago who did not recommend any intervention at that time and to try a walking program. Given that she is now having pain at rest, will refer to vascular to discuss possible treatment options. She is not a candidate for aspirin due to [place] so we are quite limited in medical management.       Relevant Orders   Ambulatory referral to Vascular Surgery     Musculoskeletal and Integument   Lichen planus - Primary    She was diagnosed with lichen planus by dermatology back in 2017. Notes worsening symptoms recently despite topical triamcinolone. Will place new referral to dermatology for further evaluation and treatment.       Relevant Orders  Ambulatory referral to Dermatology      No orders of the defined types were placed in this encounter.   Follow-up: Return in about 3 months (around 09/28/2019).    Delice Bison, DO

## 2019-06-29 NOTE — Assessment & Plan Note (Signed)
[name] presents with worsening pain in her bilateral lower extremities in the setting of known PAD. It has become significantly more function limiting. She and her husband agree that she could not walk more than 15-20 feet without having to sit down. She was seen by vascular a [place] over a year ago who did not recommend any intervention at that time and to try a walking program. Given that she is now having pain at rest, will refer to vascular to discuss possible treatment options. She is not a candidate for aspirin due to [place] so we are quite limited in medical management.

## 2019-07-11 ENCOUNTER — Other Ambulatory Visit: Payer: Self-pay | Admitting: Internal Medicine

## 2019-07-21 ENCOUNTER — Inpatient Hospital Stay: Payer: Medicare Other | Attending: Hematology

## 2019-07-21 ENCOUNTER — Other Ambulatory Visit: Payer: Self-pay

## 2019-07-21 DIAGNOSIS — D500 Iron deficiency anemia secondary to blood loss (chronic): Secondary | ICD-10-CM | POA: Insufficient documentation

## 2019-07-21 DIAGNOSIS — I780 Hereditary hemorrhagic telangiectasia: Secondary | ICD-10-CM | POA: Insufficient documentation

## 2019-07-21 LAB — CMP (CANCER CENTER ONLY)
ALT: 49 U/L — ABNORMAL HIGH (ref 0–44)
AST: 73 U/L — ABNORMAL HIGH (ref 15–41)
Albumin: 3 g/dL — ABNORMAL LOW (ref 3.5–5.0)
Alkaline Phosphatase: 340 U/L — ABNORMAL HIGH (ref 38–126)
Anion gap: 11 (ref 5–15)
BUN: 13 mg/dL (ref 8–23)
CO2: 21 mmol/L — ABNORMAL LOW (ref 22–32)
Calcium: 9.1 mg/dL (ref 8.9–10.3)
Chloride: 111 mmol/L (ref 98–111)
Creatinine: 1.15 mg/dL — ABNORMAL HIGH (ref 0.44–1.00)
GFR, Est AFR Am: 52 mL/min — ABNORMAL LOW (ref >60)
GFR, Estimated: 45 mL/min — ABNORMAL LOW (ref >60)
Glucose, Bld: 138 mg/dL — ABNORMAL HIGH (ref 70–99)
Potassium: 4.1 mmol/L (ref 3.5–5.1)
Sodium: 143 mmol/L (ref 135–145)
Total Bilirubin: 0.6 mg/dL (ref 0.3–1.2)
Total Protein: 6.4 g/dL — ABNORMAL LOW (ref 6.5–8.1)

## 2019-07-21 LAB — CBC WITH DIFFERENTIAL (CANCER CENTER ONLY)
Abs Immature Granulocytes: 0.06 10*3/uL (ref 0.00–0.07)
Basophils Absolute: 0 10*3/uL (ref 0.0–0.1)
Basophils Relative: 1 %
Eosinophils Absolute: 0.6 10*3/uL — ABNORMAL HIGH (ref 0.0–0.5)
Eosinophils Relative: 7 %
HCT: 35.9 % — ABNORMAL LOW (ref 36.0–46.0)
Hemoglobin: 11.6 g/dL — ABNORMAL LOW (ref 12.0–15.0)
Immature Granulocytes: 1 %
Lymphocytes Relative: 8 %
Lymphs Abs: 0.7 10*3/uL (ref 0.7–4.0)
MCH: 28.5 pg (ref 26.0–34.0)
MCHC: 32.3 g/dL (ref 30.0–36.0)
MCV: 88.2 fL (ref 80.0–100.0)
Monocytes Absolute: 0.8 10*3/uL (ref 0.1–1.0)
Monocytes Relative: 9 %
Neutro Abs: 6.5 10*3/uL (ref 1.7–7.7)
Neutrophils Relative %: 74 %
Platelet Count: 184 10*3/uL (ref 150–400)
RBC: 4.07 MIL/uL (ref 3.87–5.11)
RDW: 14.5 % (ref 11.5–15.5)
WBC Count: 8.7 10*3/uL (ref 4.0–10.5)
nRBC: 0 % (ref 0.0–0.2)

## 2019-07-21 LAB — FERRITIN: Ferritin: 41 ng/mL (ref 11–307)

## 2019-07-21 LAB — TOTAL PROTEIN, URINE DIPSTICK: Protein, ur: 300 mg/dL — AB

## 2019-07-24 ENCOUNTER — Encounter: Payer: Self-pay | Admitting: *Deleted

## 2019-07-26 ENCOUNTER — Telehealth (HOSPITAL_COMMUNITY): Payer: Self-pay

## 2019-07-26 ENCOUNTER — Other Ambulatory Visit: Payer: Self-pay | Admitting: *Deleted

## 2019-07-26 DIAGNOSIS — I739 Peripheral vascular disease, unspecified: Secondary | ICD-10-CM

## 2019-07-26 NOTE — Telephone Encounter (Signed)

## 2019-07-27 ENCOUNTER — Ambulatory Visit (INDEPENDENT_AMBULATORY_CARE_PROVIDER_SITE_OTHER): Payer: Medicare Other | Admitting: Physician Assistant

## 2019-07-27 ENCOUNTER — Other Ambulatory Visit: Payer: Self-pay

## 2019-07-27 ENCOUNTER — Ambulatory Visit (HOSPITAL_COMMUNITY)
Admission: RE | Admit: 2019-07-27 | Discharge: 2019-07-27 | Disposition: A | Discharge status: Home / Self Care | Payer: Medicare Other | Source: Ambulatory Visit | Attending: Vascular Surgery | Admitting: Vascular Surgery

## 2019-07-27 VITALS — BP 154/67 | HR 58 | Temp 97.9°F | Resp 20 | Ht 63.0 in | Wt 156.0 lb

## 2019-07-27 DIAGNOSIS — I739 Peripheral vascular disease, unspecified: Secondary | ICD-10-CM | POA: Diagnosis present

## 2019-07-27 DIAGNOSIS — G629 Polyneuropathy, unspecified: Secondary | ICD-10-CM | POA: Diagnosis not present

## 2019-07-27 DIAGNOSIS — R29898 Other symptoms and signs involving the musculoskeletal system: Secondary | ICD-10-CM | POA: Diagnosis not present

## 2019-07-27 MED ORDER — GABAPENTIN 100 MG PO CAPS: 100.0000 mg | ORAL_CAPSULE | Freq: Two times a day (BID) | ORAL | 0 refills | Status: DC | PRN

## 2019-07-27 NOTE — Progress Notes (Signed)
Established PAD   History of Present Illness   [name] is a 79 y.o. (02-17-1940) female who presents with chief complaint of BLE pain.  Patient has been seen in the past, last time being in November 2019 with similar symptoms.  She complains of symmetrical pain of bilateral lower extremities extending from the knees down her shins and into her feet.  This pain does not change with ambulation or at rest.  Patient says she has good days and bad days when referring to her pain.  She denies any significant pain in her feet.  She also denies any nonhealing wounds of bilateral lower extremities.  She is seen today in her wheelchair however her husband and the patient states that she is able to walk with a cane but admittedly does not walk much.  Past medical history is also significant for insulin-dependent diabetes mellitus and CHF.  She denies tobacco use.  She is unable to take blood thinners due to history of GI bleeds and gastric AVMs.  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  The patient's PMH, PSH, SH, and FamHx were reviewed and are unchanged from prior visit.  Current Outpatient Medications  Medication Sig Dispense Refill  . amLODipine (NORVASC) 10 MG tablet Take 1 tablet by mouth once daily 90 tablet 1  . b complex vitamins capsule Take 1 capsule by mouth daily. 100 capsule 2  . Blood Glucose Monitoring Suppl (ONETOUCH VERIO) w/Device KIT Use to check blood sugar up to 4 times a day 1 kit 1  . calcium-vitamin D (OSCAL WITH D) 500-200 MG-UNIT tablet Take 1 tablet by mouth 2 (two) times daily.    . cetaphil (CETAPHIL) lotion Apply 1 application topically as needed for dry skin. 236 mL 0  . donepezil (ARICEPT) 10 MG tablet Take 1 tablet (10 mg total) by mouth at bedtime. 90 tablet 4  . Dulaglutide (TRULICITY) 3 AT/5.5DD SOPN Inject 3 mg into the skin once a week. 4 pen 2  . Elastic Bandages & Supports (MEDICAL COMPRESSION STOCKINGS) MISC Wear as much as  possible while awake to reduce swelling 2 each 0  . FERROCITE 324 MG TABS tablet Take 1 tablet by mouth twice daily 60 tablet 5  . furosemide (LASIX) 20 MG tablet Take 1 tablet (20 mg total) by mouth daily. 90 tablet 3  . glucose blood (ONETOUCH VERIO) test strip Use as instructed 4 times daily. E11.29, insulin requiring 350 each 3  . Insulin Pen Needle (B-D UF III MINI PEN NEEDLES) 31G X 5 MM MISC USE TO INJECT INSULIN 3 TIMES A DAY 100 each 0  . lactulose (CHRONULAC) 10 GM/15ML solution TAKE 15 ML BY MOUTH TWO TIMES DAILY 473 mL 2  . levETIRAcetam (KEPPRA) 500 MG tablet Take 1 tablet (500 mg total) by mouth 2 (two) times daily. 180 tablet 4  . memantine (NAMENDA) 10 MG tablet Take 1 tablet (10 mg total) by mouth 2 (two) times daily. 180 tablet 4  . ONETOUCH DELICA LANCETS FINE MISC Check blood sugar up to 4 times a day 300 each 4  . oxycodone-acetaminophen (PERCOCET) 2.5-325 MG tablet Take 1 tablet by mouth at bedtime as needed for pain. 30 tablet 0  . oxymetazoline (AFRIN) 0.05 % nasal spray Place 1 spray into both nostrils 2 (two) times daily as needed (Epistaxis). 30 mL 0  . pantoprazole (PROTONIX) 40 MG tablet Take 1 tablet by mouth once daily 180 tablet 0  . sodium  chloride (OCEAN) 0.65 % SOLN nasal spray Place 1 spray into both nostrils as needed for congestion. 60 mL 0  . TRESIBA FLEXTOUCH 100 UNIT/ML FlexTouch Pen INJECT 21 UNITS SUBCUTANEOUSLY IN THE EVENING 15 mL 0  . triamcinolone ointment (KENALOG) 0.5 % APPLY 1 APPLICATION TOPICALLY TO RASH TWICE DAILY FOR ITCHING (Patient taking differently: Apply 1 application topically 2 (two) times daily as needed (itching). ) 15 g 1  . gabapentin (NEURONTIN) 100 MG capsule Take 1 capsule (100 mg total) by mouth 2 (two) times daily as needed. 30 capsule 0   No current facility-administered medications for this visit.    REVIEW OF SYSTEMS (negative unless checked):   Cardiac:  '[]'$  Chest pain or chest pressure? '[]'$  Shortness of breath upon  activity? '[]'$  Shortness of breath when lying flat? '[]'$  Irregular heart rhythm?  Vascular:  '[]'$  Pain in calf, thigh, or hip brought on by walking? '[]'$  Pain in feet at night that wakes you up from your sleep? '[]'$  Blood clot in your veins? '[]'$  Leg swelling?  Pulmonary:  '[]'$  Oxygen at home? '[]'$  Productive cough? '[]'$  Wheezing?  Neurologic:  '[]'$  Sudden weakness in arms or legs? '[]'$  Sudden numbness in arms or legs? '[]'$  Sudden onset of difficult speaking or slurred speech? '[]'$  Temporary loss of vision in one eye? '[]'$  Problems with dizziness?  Gastrointestinal:  '[]'$  Blood in stool? '[]'$  Vomited blood?  Genitourinary:  '[]'$  Burning when urinating? '[]'$  Blood in urine?  Psychiatric:  '[]'$  Major depression  Hematologic:  '[]'$  Bleeding problems? '[]'$  Problems with blood clotting?  Dermatologic:  '[]'$  Rashes or ulcers?  Constitutional:  '[]'$  Fever or chills?  Ear/Nose/Throat:  '[]'$  Change in hearing? '[]'$  Nose bleeds? '[]'$  Sore throat?  Musculoskeletal:  '[]'$  Back pain? '[]'$  Joint pain? '[]'$  Muscle pain?   Physical Examination   Vitals:   07/27/19 1336  BP: (!) 154/67  Pulse: (!) 58  Resp: 20  Temp: 97.9 F (36.6 C)  TempSrc: Temporal  SpO2: 93%  Weight: 156 lb (70.8 kg)  Height: '5\' 3"'$  (1.6 m)   Body mass index is 27.63 kg/m.  General:  WDWN in NAD; vital signs documented above Gait: Not observed HENT: WNL, normocephalic Pulmonary: normal non-labored breathing , without Rales, rhonchi,  wheezing Cardiac: regular HR Abdomen: soft, NT, no masses Skin: without rashes Vascular Exam/Pulses:  Right Left  Radial 2+ (normal) 2+ (normal)  DP absent absent  PT absent absent   Extremities: Feet are warm and well-perfused with good capillary refill; no palpable pedal pulses; pitting edema to the level of the proximal shin bilaterally; no tissue loss or nonhealing wounds Musculoskeletal: no muscle wasting or atrophy  Neurologic: A&O X 3;  No focal weakness or paresthesias are detected Psychiatric:   The pt has Normal affect.  Non-Invasive Vascular imaging    ABI  ABI/TBIToday's ABIToday's TBIPrevious ABIPrevious TBI  +-------+-----------+-----------+------------+------------+  Right 0.67    0.41    0.73    0.72      +-------+-----------+-----------+------------+------------+  Left  0.66    0.40    0.65    0.69        Medical Decision Making   [name] is a 79 y.o. female who presents due to vague but symmetric bilateral lower extremity pain at rest and with ambulation   ABIs are essentially unchanged compared to 12/2017  Patient is without ischemic rest pain and nonhealing wounds of bilateral lower extremities  Given the symmetry and nature of her symptoms of pain as well  as her insulin-dependent diabetes mellitus she may have a component of neuropathy  I have recommended low-dose Neurontin as a trial; I discussed with the patient and her husband this should not be mixed with Aricept or Percocet noted on her home medication list and voiced her understanding  There is no indication for further work-up of her infrainguinal occlusive disease at this time unless she develops rest pain in her feet or nonhealing wounds  I have also recommended that she make an effort to be more active during the day and patient and her husband agreed  She will call/return to office if she develops nonhealing wounds or ischemic rest pain   Dagoberto Ligas PA-C [facility]: [phone]  Clinic MD: Scot Dock

## 2019-08-01 ENCOUNTER — Telehealth: Payer: Self-pay | Admitting: *Deleted

## 2019-08-01 NOTE — Telephone Encounter (Signed)
Call from pt - stated her right hand is swollen, x 2 weeks. No c/o pain; able to wiggle fingers. [facility] appt scheduled for tomorrow @ 1315 PM.

## 2019-08-02 ENCOUNTER — Encounter: Payer: Self-pay | Admitting: Internal Medicine

## 2019-08-02 ENCOUNTER — Other Ambulatory Visit: Payer: Self-pay

## 2019-08-02 ENCOUNTER — Ambulatory Visit (INDEPENDENT_AMBULATORY_CARE_PROVIDER_SITE_OTHER): Payer: Medicare Other | Admitting: Internal Medicine

## 2019-08-02 VITALS — BP 167/58 | HR 57 | Temp 98.0°F | Ht 63.0 in | Wt 170.2 lb

## 2019-08-02 DIAGNOSIS — R600 Localized edema: Secondary | ICD-10-CM

## 2019-08-02 DIAGNOSIS — I509 Heart failure, unspecified: Secondary | ICD-10-CM

## 2019-08-02 DIAGNOSIS — M7989 Other specified soft tissue disorders: Secondary | ICD-10-CM

## 2019-08-02 DIAGNOSIS — E8770 Fluid overload, unspecified: Secondary | ICD-10-CM | POA: Insufficient documentation

## 2019-08-02 DIAGNOSIS — I5032 Chronic diastolic (congestive) heart failure: Secondary | ICD-10-CM

## 2019-08-02 DIAGNOSIS — I5033 Acute on chronic diastolic (congestive) heart failure: Secondary | ICD-10-CM

## 2019-08-02 MED ORDER — FUROSEMIDE 40 MG PO TABS: 40.0000 mg | ORAL_TABLET | Freq: Every day | ORAL | 0 refills | Status: DC

## 2019-08-02 NOTE — Assessment & Plan Note (Signed)
Patient presented with right hand swelling without pain.  On exam, she has significant bilateral lower extremity pitting edema as well. She is hypervolemic today.  No evidence of acute HF exacerbation to consider hospitalization.  She was supposed to be on Lasix 20 mg p.o. daily however she has not taken that for a while and her husband mentions that they think someone told him to stop that medication long time ago.  -P.o. Lasix 40 mg daily x 5 days -CMP today -Follow-up in clinic in 5 days to reassess volume status, repeat BMP.   -Patient will need refill on Lasix next visit

## 2019-08-02 NOTE — Progress Notes (Signed)
   CC: Right hand swelling  HPI:  [name] is a 79 y.o. female with PMHx as documented below, presented with complaint of right hand swellings for 2 weeks. Please refer to problem based charting for further details and assessment and plan of current problem and chronic medical conditions.  PMHx: PAF, pulm HTN, HHT, CHF, HTN, PAD, DM, CKD,  Medications: Amlodipine, Trulicity, Lasix 20 mg QD (patient did not take it), Keppra, Tyler Aas, Omeprazole  Past Medical History:  Diagnosis Date  . Chronic anemia   . Chronic diastolic CHF (congestive heart failure) ([place]) 10/03/2013  . Chronic GI bleeding    [name] 11/29/2014  . Family history of anesthesia complication    "niece has a hard time coming out" (09/15/2012)  . Frequent nosebleeds    chronic  . Gastric AV malformation    [name] 11/29/2014  . GERD (gastroesophageal reflux disease)   . 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 History of blood transfusion "several"  . History of epistaxis   . HTN (hypertension), benign 03/02/2012  . Hyperlipidemia   . Iron deficiency anemia    chronic infusions"  . Lichen planus    Both lower extremities  . 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 Overgrown toenails 03/18/2017  . Pneumonia 1990's X 2  . Pulmonary HTN ([place]) 04/01/2017   PASP 20mmHg on echo 09/2016 and 92mmHg by echo 2019  . Seizures ([place]) 09/2014  . Symptomatic anemia 11/29/2014  . Telangiectasia    Gastric   . 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   Review of Systems:  Review of Systems  Constitutional: Negative for chills, fever and malaise/fatigue.  Respiratory: Negative for shortness of breath.   Cardiovascular: Positive for leg swelling. Negative for chest pain and orthopnea.  Musculoskeletal: Negative for myalgias.       Positive for right hand swelling    Physical Exam:  Vitals:   08/02/19 1329  BP: (!) 167/58  Pulse: (!) 57  Temp: 98 F (36.7 C)    TempSrc: Oral  SpO2: 97%  Weight: 170 lb 3.2 oz (77.2 kg)  Height: 5\' 3"  (1.6 m)    Physical Exam  Constitutional: She does not appear ill. No distress.  HENT:  Head: Normocephalic and atraumatic.  Cardiovascular: Regular rhythm and normal pulses.  Murmur heard. Borderline bradycardia  Respiratory: Effort normal. She has no wheezes. She has rales.  GI: Soft. Normal appearance and bowel sounds are normal. There is no abdominal tenderness.  Musculoskeletal:        General: Swelling present. No tenderness.     Right lower leg: Edema present.     Left lower leg: Edema present.     Comments: 2-3+ bilateral lower extremity pitting edema 1-2+ pitting edema of right hand  Neurological: She is alert.  Psychiatric: Her behavior is normal. Mood and thought content normal.   Assessment & Plan:   See Encounters Tab for problem based charting.  Patient discussed with Dr. Philipp Ovens

## 2019-08-02 NOTE — Patient Instructions (Signed)
Thank you for allowing [place] to provide your care today. Today we discussed your hand and legs swelling. My evaluation shows that this is due to extra fluid in your body likeley because you did not take your fluid pill (Furosemide) for a while.  I send a prescription for Furosemide (Lasix) to your pharmacy. Please pick it up and take it as below:  1-Take 1 tablet of Frusemide (40 mg) once a day 2- Come back to our clinic in 5 days (next Monday). Please get an appointment from the front desk) 3- Please take rest of your medications as before.  I send you to the lab for blood work.  As always, if you developed shortness of breath, weakness, chest pain or other symptoms, seek medical attention at emergency room.  Should you have any questions or concerns please call the internal medicine clinic at [phone].

## 2019-08-02 NOTE — Assessment & Plan Note (Addendum)
Hypervolemia 2/2 heart failure. Due to medication non-adherence:  Patient presented with right hand swelling without pain. Denies new SOB, DOE, or orthopnea. She has 14 pound weight gain in a week. On exam, she has 1-2+ pitting edema on right hand as well as 2-3+ pitting edema of bilateral lower extremities.  Also has bibasilar mild crackles on lung exam.  Assessment and plan: Hypervolemia 2/2 non compliance to diuretics. She was supposed to be on Lasix 20 mg p.o. daily however she has not taken that for a while and her husband mentions that they think someone told him to stop that medication long time ago. Per chart review, no documentation regarding plan for stopping Lasix. She had a CMP done about 2 weeks ago with elevated LFT, AKI on CKD that could be secondary to congestion due to hypervolemia.  Will trend CMP.  -P.o. Lasix 40 mg daily x 5 days -Follow-up in clinic in 5 days -CMP today -CMP next visit -ED visit precautions provided

## 2019-08-03 LAB — CMP14 + ANION GAP
ALT: 35 IU/L — ABNORMAL HIGH (ref 0–32)
AST: 44 IU/L — ABNORMAL HIGH (ref 0–40)
Albumin/Globulin Ratio: 1.4 (ref 1.2–2.2)
Albumin: 3.1 g/dL — ABNORMAL LOW (ref 3.7–4.7)
Alkaline Phosphatase: 372 IU/L — ABNORMAL HIGH (ref 48–121)
Anion Gap: 13 mmol/L (ref 10.0–18.0)
BUN/Creatinine Ratio: 17 (ref 12–28)
BUN: 14 mg/dL (ref 8–27)
Bilirubin Total: 0.5 mg/dL (ref 0.0–1.2)
CO2: 22 mmol/L (ref 20–29)
Calcium: 8.4 mg/dL — ABNORMAL LOW (ref 8.7–10.3)
Chloride: 111 mmol/L — ABNORMAL HIGH (ref 96–106)
Creatinine, Ser: 0.83 mg/dL (ref 0.57–1.00)
GFR calc Af Amer: 78 mL/min/{1.73_m2} (ref >59)
GFR calc non Af Amer: 67 mL/min/{1.73_m2} (ref >59)
Globulin, Total: 2.2 g/dL (ref 1.5–4.5)
Glucose: 248 mg/dL — ABNORMAL HIGH (ref 65–99)
Potassium: 3.6 mmol/L (ref 3.5–5.2)
Sodium: 146 mmol/L — ABNORMAL HIGH (ref 134–144)
Total Protein: 5.3 g/dL — ABNORMAL LOW (ref 6.0–8.5)

## 2019-08-04 NOTE — Progress Notes (Addendum)
Internal Medicine Clinic Attending  Case discussed with Dr. Myrtie Hawk at the time of the visit.  We reviewed the resident's history and exam and pertinent patient test results.  I agree with the assessment, diagnosis, and plan of care documented in the resident's note with the following correction:  Patient here with hypervolemia 2/2 to acute on chronic diastolic heart failure in the setting of discontinuing her lasix. Labs today show stable renal function. LFTs appear to be chronically elevated, unclear if this has been worked up. Agree with PO lasix 40 x 5 days. Return to clinic next week for follow up and repeat labs. Once patient is euvolemic will likely need to resume her prior maintenance dose of lasix 20 mg daily.

## 2019-08-07 ENCOUNTER — Ambulatory Visit (INDEPENDENT_AMBULATORY_CARE_PROVIDER_SITE_OTHER): Payer: Medicare Other | Admitting: Internal Medicine

## 2019-08-07 ENCOUNTER — Encounter: Payer: Self-pay | Admitting: Internal Medicine

## 2019-08-07 VITALS — BP 136/46 | HR 55 | Temp 99.3°F | Ht 63.0 in | Wt 164.1 lb

## 2019-08-07 DIAGNOSIS — I5032 Chronic diastolic (congestive) heart failure: Secondary | ICD-10-CM | POA: Diagnosis not present

## 2019-08-07 DIAGNOSIS — R7989 Other specified abnormal findings of blood chemistry: Secondary | ICD-10-CM

## 2019-08-07 DIAGNOSIS — R945 Abnormal results of liver function studies: Secondary | ICD-10-CM

## 2019-08-07 NOTE — Patient Instructions (Addendum)
It was nice seeing you today! Thank you for choosing [facility] for your Primary Care.    Today we talked about:   1. Heart Failure and Swelling: We got blood work today to check your kidney function. I will call you with the results and further instructions on the fluid pills. Please follow up with [place] on Monday of next week. Continue with the Lasix 40 mg for now.   2. Please weight yourself at home daily and keep a log.

## 2019-08-07 NOTE — Progress Notes (Signed)
   CC: HF follow up  HPI:  [name] is a 79 y.o. with a PMHx as listed below who presents to the clinic for HF follow up.   Please see the Encounters tab for problem-based Assessment & Plan regarding status of patient's chronic conditions.  Past Medical History:  Diagnosis Date  . Chronic anemia   . Chronic diastolic CHF (congestive heart failure) ([place]) 10/03/2013  . Chronic GI bleeding    [name] 11/29/2014  . Family history of anesthesia complication    "niece has a hard time coming out" (09/15/2012)  . Frequent nosebleeds    chronic  . Gastric AV malformation    [name] 11/29/2014  . GERD (gastroesophageal reflux disease)   . 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 History of blood transfusion "several"  . History of epistaxis   . HTN (hypertension), benign 03/02/2012  . Hyperlipidemia   . Iron deficiency anemia    chronic infusions"  . Lichen planus    Both lower extremities  . 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 Overgrown toenails 03/18/2017  . Pneumonia 1990's X 2  . Pulmonary HTN ([place]) 04/01/2017   PASP 41mmHg on echo 09/2016 and 68mmHg by echo 2019  . Seizures ([place]) 09/2014  . Symptomatic anemia 11/29/2014  . Telangiectasia    Gastric   . 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   Review of Systems: Review of Systems  Constitutional: Negative for chills and fever.  Respiratory: Negative for shortness of breath and wheezing.   Cardiovascular: Positive for leg swelling. Negative for chest pain, palpitations and orthopnea.  Genitourinary: Negative for frequency and urgency.  Neurological: Negative for dizziness and headaches.   Physical Exam:  Vitals:   08/07/19 1420  BP: (!) 136/46  Pulse: (!) 55  Temp: 99.3 F (37.4 C)  TempSrc: Oral  SpO2: 93%  Height: 5\' 3"  (1.6 m)   Physical Exam Vitals and nursing note reviewed.  Constitutional:      General: She is not in acute distress.    Appearance:  She is normal weight.  Cardiovascular:     Rate and Rhythm: Normal rate and regular rhythm.     Heart sounds: No murmur heard.  No gallop.   Pulmonary:     Effort: Pulmonary effort is normal. No respiratory distress.     Breath sounds: Normal breath sounds. No wheezing or rales.  Musculoskeletal:     Right lower leg: Edema (1-2+ pitting edema up to knee) present.     Left lower leg: Edema (1-2+ pitting edema up to knee) present.     Comments: No edema on upper extremities or abdomen  Skin:    General: Skin is warm and dry.  Neurological:     General: No focal deficit present.     Mental Status: She is alert and oriented to person, place, and time. Mental status is at baseline.  Psychiatric:        Mood and Affect: Mood normal.        Behavior: Behavior normal.    Assessment & Plan:   See Encounters Tab for problem based charting.  Patient discussed with Dr. Philipp Ovens

## 2019-08-08 LAB — CMP14 + ANION GAP
ALT: 19 IU/L (ref 0–32)
AST: 39 IU/L (ref 0–40)
Albumin/Globulin Ratio: 1.3 (ref 1.2–2.2)
Albumin: 2.9 g/dL — ABNORMAL LOW (ref 3.7–4.7)
Alkaline Phosphatase: 331 IU/L — ABNORMAL HIGH (ref 48–121)
Anion Gap: 13 mmol/L (ref 10.0–18.0)
BUN/Creatinine Ratio: 10 — ABNORMAL LOW (ref 12–28)
BUN: 10 mg/dL (ref 8–27)
Bilirubin Total: 0.6 mg/dL (ref 0.0–1.2)
CO2: 25 mmol/L (ref 20–29)
Calcium: 8.5 mg/dL — ABNORMAL LOW (ref 8.7–10.3)
Chloride: 108 mmol/L — ABNORMAL HIGH (ref 96–106)
Creatinine, Ser: 1 mg/dL (ref 0.57–1.00)
GFR calc Af Amer: 62 mL/min/{1.73_m2} (ref >59)
GFR calc non Af Amer: 54 mL/min/{1.73_m2} — ABNORMAL LOW (ref >59)
Globulin, Total: 2.2 g/dL (ref 1.5–4.5)
Glucose: 113 mg/dL — ABNORMAL HIGH (ref 65–99)
Potassium: 3.5 mmol/L (ref 3.5–5.2)
Sodium: 146 mmol/L — ABNORMAL HIGH (ref 134–144)
Total Protein: 5.1 g/dL — ABNORMAL LOW (ref 6.0–8.5)

## 2019-08-08 LAB — HM DIABETES EYE EXAM

## 2019-08-09 ENCOUNTER — Other Ambulatory Visit: Payer: Self-pay | Admitting: Internal Medicine

## 2019-08-09 DIAGNOSIS — R7989 Other specified abnormal findings of blood chemistry: Secondary | ICD-10-CM | POA: Insufficient documentation

## 2019-08-09 DIAGNOSIS — E8770 Fluid overload, unspecified: Secondary | ICD-10-CM

## 2019-08-09 MED ORDER — FUROSEMIDE 40 MG PO TABS
60.0000 mg | ORAL_TABLET | Freq: Every day | ORAL | 0 refills | Status: DC
Start: 2019-08-09 — End: 2019-08-16

## 2019-08-09 NOTE — Assessment & Plan Note (Signed)
Wt Readings from Last 3 Encounters:  08/07/19 164 lb 1.6 oz (74.4 kg)  08/02/19 170 lb 3.2 oz (77.2 kg)  07/27/19 156 lb (70.8 kg)   [name] is following up today in regards to her leg and arm swelling from last visit, as well as weight gain.  She states she has been able to tolerate the increased dose of Lasix without any difficulty.  She feels the swelling in her arms has resolved completely, but endorses continued lower extremity swelling.  She denies any orthopnea and continues to sleep on 3 pillows, which is her usual.  She denies any chest pain, difficulty breathing, urinary frequency or urgency.  Assessment/plan: Swelling has certainly improved from her prior visit, but she continues to look edematous on her lower extremities.  On her way evaluation, she has lost approximately 6 pounds in the last week but continues to be above her dry weight.  She is not having any red flag symptoms right now including orthopnea, difficulty breathing, or hypoxia.  BMP was obtained to evaluate for AKI.  Creatinine is mildly elevated from last week's 0.8-1.0.  However patient has a history of CKD stage IIIa, and so her creatinine is still within her baseline.  We will go ahead and increase Lasix from 40 mg to 60 mg for 1 week only and have her rechecked in the clinic at that time to push towards her dry weight.  -Lasix 60 mg once daily x7 days -1 week follow-up

## 2019-08-09 NOTE — Assessment & Plan Note (Signed)
Ms. Hannig denies any abdominal pain or swelling today.   Assessment/plan: [name] has a history of elevated LFTs that have been fairly consistent dating back to September 2020.  Predominantly cholestatic pattern given alk phos has been in the 200s and 300s since then.  Per chart review, her AST and ALT peaked in November 2020 with AST.   Last abdominal ultrasound that comments on the liver dates back to 2008.  Last hepatitis panel was in 2016 and negative at that time.  Differential at this point is wide and will require further evaluation.  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  These most often include widespread vascular malformations within the liver but also includes biliary strictures, dilations or cyst.   -Recommend continued discussion regarding further work-up at next visit with patient

## 2019-08-11 NOTE — Progress Notes (Signed)
[place]   [phone] [phone]   Clinic Follow up Note   Patient Care Team: Delice Bison, DO as PCP - General [name], MD as PCP - Cardiology (Cardiology) Truitt Merle, MD as Consulting Physician (Hematology) Elayne Snare, MD as Consulting Physician (Endocrinology)  Date of Service:  08/18/2019  CHIEF COMPLAINT: F/u ofAnemia due to chronic blood loss, HHT  HEMATOLOGY HISTORY 1.Hereditary hemorrhagic telangiectasia (HHT),with epistaxis andfrequentGI bleeding. Previously under care of Dr. [name] 2.Anemia secondary to GI bleeding and iron deficiency, required blood transfusion every 3 to 4 weeks, IV Feraheme every 1 to 2 months  CURRENT THERAPY 1. Iv feraheme as needed if ferritin<50, latest on 09/12/18 2.Avastin'5mg'$ /kg every 2 weeks, starting3/30/20. Reduced to every 4 weeks starting 11/07/18.Reduced to every 2 months starting 01/02/19.Held 03/09/19-05/26/19 due to high proteinuria. 3. Blood transfusion if Hg<8.0   INTERVAL HISTORY:  Duck is here for a follow up of anemia and HHT. She presents to the clinic alone. She notes she still has leg pain bilaterally. She notes her hands were swollen in the past few weeks, but now resolved.  She notes her energy is low. She denies overt bleeding. She still has LE rash. She has not been seen by dermatologist yet.    REVIEW OF SYSTEMS:   Constitutional: Denies fevers, chills or abnormal weight loss Eyes: Denies blurriness of vision Ears, nose, mouth, throat, and face: Denies mucositis or sore throat Respiratory: Denies cough, dyspnea or wheezes Cardiovascular: Denies palpitation, chest discomfort or lower extremity swelling Gastrointestinal:  Denies nausea, heartburn or change in bowel habits Skin: (+) LE skin rash, stable  MSK: (+) B/l leg pain  Lymphatics: Denies new lymphadenopathy or easy bruising Neurological:Denies numbness, tingling or new  weaknesses Behavioral/Psych: Mood is stable, no new changes  All other systems were reviewed with the patient and are negative.  MEDICAL HISTORY:  Past Medical History:  Diagnosis Date  . Chronic anemia   . Chronic diastolic CHF (congestive heart failure) ([place]) 10/03/2013  . Chronic GI bleeding    [name] 11/29/2014  . Family history of anesthesia complication    "niece has a hard time coming out" (09/15/2012)  . Frequent nosebleeds    chronic  . Gastric AV malformation    [name] 11/29/2014  . GERD (gastroesophageal reflux disease)   . 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 History of blood transfusion "several"  . History of epistaxis   . HTN (hypertension), benign 03/02/2012  . Hyperlipidemia   . Iron deficiency anemia    chronic infusions"  . Lichen planus    Both lower extremities  . 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 Overgrown toenails 03/18/2017  . Pneumonia 1990's X 2  . Pulmonary HTN ([place]) 04/01/2017   PASP 75mHg on echo 09/2016 and 462mg by echo 2019  . [place]  . Symptomatic anemia 11/29/2014  . Telangiectasia    Gastric   . 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    SURGICAL HISTORY: Past Surgical History:  Procedure Laterality Date  . CATARACT EXTRACTION     "I think it was just one eye"  . ESOPHAGOGASTRODUODENOSCOPY  02/26/2011   Procedure: ESOPHAGOGASTRODUODENOSCOPY (EGD);  Surgeon: [name];  [facility];  Service: Endoscopy;  Laterality: N/A;  . ESOPHAGOGASTRODUODENOSCOPY N/A 11/08/2012   Procedure: ESOPHAGOGASTRODUODENOSCOPY (EGD);  Surgeon: [name];  [facility];  Service: Endoscopy;  Laterality: N/A;  . ESOPHAGOGASTRODUODENOSCOPY N/A 10/04/2013   Procedure: ESOPHAGOGASTRODUODENOSCOPY (EGD);  Surgeon: Winfield Cunas., MD;  [name];  Service: Endoscopy;  Laterality: N/A;  with APC on stand-by  . ESOPHAGOGASTRODUODENOSCOPY N/A  07/06/2014   Procedure: ESOPHAGOGASTRODUODENOSCOPY (EGD);  Surgeon: Clarene Essex, MD;  [name];  Service: Endoscopy;  Laterality: N/A;  . ESOPHAGOGASTRODUODENOSCOPY N/A 09/05/2014   Procedure: ESOPHAGOGASTRODUODENOSCOPY (EGD);  Surgeon: Laurence Spates, MD;  [name];  Service: Endoscopy;  Laterality: N/A;  APC on standby to control bleeding  . ESOPHAGOGASTRODUODENOSCOPY N/A 11/29/2014   Procedure: ESOPHAGOGASTRODUODENOSCOPY (EGD);  Surgeon: [name], MD;  [facility]COPY;  Service: Endoscopy;  Laterality: N/A;  . ESOPHAGOGASTRODUODENOSCOPY N/A 09/28/2015   Procedure: ESOPHAGOGASTRODUODENOSCOPY (EGD);  Surgeon: Clarene Essex, MD;  Location: [facility] ENDOSCOPY;  Service: Endoscopy;  Laterality: N/A;  . ESOPHAGOGASTRODUODENOSCOPY (EGD) WITH PROPOFOL N/A 12/04/2017   Procedure: ESOPHAGOGASTRODUODENOSCOPY (EGD) WITH PROPOFOL;  Surgeon: [name], MD;  Location: [place];  Service: Endoscopy;  Laterality: N/A;  . ESOPHAGOGASTRODUODENOSCOPY ENDOSCOPY  08/19/2006   with laser treatment  . HOT HEMOSTASIS N/A 07/06/2014   Procedure: HOT HEMOSTASIS (ARGON PLASMA COAGULATION/BICAP);  Surgeon: Clarene Essex, MD;  [name];  Service: Endoscopy;  Laterality: N/A;  . HOT HEMOSTASIS N/A 09/28/2015   Procedure: HOT HEMOSTASIS (ARGON PLASMA COAGULATION/BICAP);  Surgeon: Clarene Essex, MD;  [facility];  Service: Endoscopy;  Laterality: N/A;  . HOT HEMOSTASIS N/A 12/04/2017   Procedure: HOT HEMOSTASIS (ARGON PLASMA COAGULATION/BICAP);  Surgeon: [name], MD;  Location: [place];  Service: Endoscopy;  Laterality: N/A;  . NASAL HEMORRHAGE CONTROL     "for bleeding"   . SAVORY DILATION  02/26/2011   Procedure: SAVORY DILATION;  Surgeon: [name], MD;  Location: [name];  Service: Endoscopy;  Laterality: N/A;  c-arm needed  . SUBMUCOSAL INJECTION  12/04/2017   Procedure: SUBMUCOSAL INJECTION;  Surgeon: [name], MD;  Location: [facility]  ENDOSCOPY;  Service: Endoscopy;;    I have reviewed the social history and family history with the patient and they are unchanged from previous note.  ALLERGIES:  is allergic to aspirin.  MEDICATIONS:  Current Outpatient Medications  Medication Sig Dispense Refill  . amLODipine (NORVASC) 10 MG tablet Take 1 tablet by mouth once daily 90 tablet 1  . b complex vitamins capsule Take 1 capsule by mouth daily. 100 capsule 2  . Blood Glucose Monitoring Suppl (ONETOUCH VERIO) w/Device KIT Use to check blood sugar up to 4 times a day 1 kit 1  . calcium-vitamin D (OSCAL WITH D) 500-200 MG-UNIT tablet Take 1 tablet by mouth 2 (two) times daily.    . cetaphil (CETAPHIL) lotion Apply 1 application topically as needed for dry skin. 236 mL 0  . donepezil (ARICEPT) 10 MG tablet Take 1 tablet (10 mg total) by mouth at bedtime. 90 tablet 4  . Dulaglutide (TRULICITY) 3 HY/8.5OY SOPN Inject 3 mg into the skin once a week. 4 pen 2  . Elastic Bandages & Supports (MEDICAL COMPRESSION STOCKINGS) MISC Wear as much as possible while awake to reduce swelling 2 each 0  . FERROCITE 324 MG TABS tablet Take 1 tablet by mouth twice daily 60 tablet 0  . furosemide (LASIX) 40 MG tablet Take 1.5 tablets (60 mg total) by mouth daily for 7 days. 10.5 tablet 0  . gabapentin (NEURONTIN) 100 MG capsule Take 1 capsule (100 mg total) by mouth 3 (three) times daily. 90 capsule 2  . glucose blood (ONETOUCH VERIO) test  strip Use as instructed 4 times daily. E11.29, insulin requiring 350 each 3  . Insulin Pen Needle (B-D UF III MINI PEN NEEDLES) 31G X 5 MM MISC USE TO INJECT INSULIN 3 TIMES A DAY 100 each 0  . lactulose (CHRONULAC) 10 GM/15ML solution TAKE 15 ML BY MOUTH TWO TIMES DAILY 473 mL 2  . levETIRAcetam (KEPPRA) 500 MG tablet Take 1 tablet (500 mg total) by mouth 2 (two) times daily. 180 tablet 4  . memantine (NAMENDA) 10 MG tablet Take 1 tablet (10 mg total) by mouth 2 (two) times daily. 180 tablet 4  . ONETOUCH DELICA  LANCETS FINE MISC Check blood sugar up to 4 times a day 300 each 4  . oxycodone-acetaminophen (PERCOCET) 2.5-325 MG tablet Take 1 tablet by mouth at bedtime as needed for pain. 30 tablet 0  . oxymetazoline (AFRIN) 0.05 % nasal spray Place 1 spray into both nostrils 2 (two) times daily as needed (Epistaxis). 30 mL 0  . pantoprazole (PROTONIX) 40 MG tablet Take 1 tablet by mouth once daily 180 tablet 0  . potassium chloride 20 MEQ/15ML (10%) SOLN Take 15 mLs (20 mEq total) by mouth 2 (two) times daily. Please take 30 mls two times daily on first day, followed by 15 mls twice daily thereafter 473 mL 0  . sodium chloride (OCEAN) 0.65 % SOLN nasal spray Place 1 spray into both nostrils as needed for congestion. 60 mL 0  . TRESIBA FLEXTOUCH 100 UNIT/ML FlexTouch Pen INJECT 21 UNITS SUBCUTANEOUSLY IN THE EVENING 15 mL 0  . triamcinolone ointment (KENALOG) 0.5 % APPLY 1 APPLICATION TOPICALLY TO RASH TWICE DAILY FOR ITCHING (Patient taking differently: Apply 1 application topically 2 (two) times daily as needed (itching). ) 15 g 1   No current facility-administered medications for this visit.   Facility-Administered Medications Ordered in Other Visits  Medication Dose Route Frequency Provider Last Rate Last Admin  . ferumoxytol (FERAHEME) 510 mg in sodium chloride 0.9 % 100 mL IVPB  510 mg Intravenous Once Truitt Merle, MD 468 mL/hr at 08/18/19 1622 510 mg at 08/18/19 1622  . heparin lock flush 100 unit/mL  500 Units Intracatheter Once PRN Truitt Merle, MD      . sodium chloride 0.9 % injection 10 mL  10 mL Intracatheter PRN Truitt Merle, MD      . sodium chloride flush (NS) 0.9 % injection 10 mL  10 mL Intracatheter PRN Truitt Merle, MD   10 mL at 08/18/19 1511    PHYSICAL EXAMINATION: ECOG PERFORMANCE STATUS: 2 - Symptomatic, <50% confined to bed Blood pressure 155/53, heart rate 60, respiratory rate 16, temperature 37, pulse ox 91% on room air  Due to [place] we will limit examination to appearance. Patient had  no complaints.  GENERAL:alert, no distress and comfortable SKIN: skin color normal (+) Stable, Diffuse papillar skin rash of B/l legs  EYES: normal, Conjunctiva are pink and non-injected, sclera clear  NEURO: alert & oriented x 3 with fluent speech    LABORATORY DATA:  I have reviewed the data as listed CBC Latest Ref Rng & Units 08/18/2019 07/21/2019 06/23/2019  WBC 4.0 - 10.5 K/uL 6.7 8.7 7.7  Hemoglobin 12.0 - 15.0 g/dL 10.3(L) 11.6(L) 10.6(L)  Hematocrit 36 - 46 % 32.3(L) 35.9(L) 32.9(L)  Platelets 150 - 400 K/uL 215 184 199     CMP Latest Ref Rng & Units 08/16/2019 08/07/2019 08/02/2019  Glucose 65 - 99 mg/dL 189(H) 113(H) 248(H)  BUN 8 - 27 mg/dL 10 10  14  Creatinine 0.57 - 1.00 mg/dL 1.08(H) 1.00 0.83  Sodium 134 - 144 mmol/L 143 146(H) 146(H)  Potassium 3.5 - 5.2 mmol/L 2.7(L) 3.5 3.6  Chloride 96 - 106 mmol/L 103 108(H) 111(H)  CO2 20 - 29 mmol/L '28 25 22  '$ Calcium 8.7 - 10.3 mg/dL 8.4(L) 8.5(L) 8.4(L)  Total Protein 6.0 - 8.5 g/dL - 5.1(L) 5.3(L)  Total Bilirubin 0.0 - 1.2 mg/dL - 0.6 0.5  Alkaline Phos 48 - 121 IU/L - 331(H) 372(H)  AST 0 - 40 IU/L - 39 44(H)  ALT 0 - 32 IU/L - 19 35(H)      RADIOGRAPHIC STUDIES: I have personally reviewed the radiological images as listed and agreed with the findings in the report. No results found.   ASSESSMENT & PLAN:  [name] is a 79 y.o. female with    1.Hereditary hemorrhagic telangiectasia(HHT)), withepistaxis and frequent GI bleeding -She has mild epistaxis, and frequent GI bleeding,she used torequire blood transfusion every 3 to 4 weeks. Her last blood transfusion was on 04/13/2018 -She started Avastin on 05/09/18.she responded very well, anemia resolved. I have changed it toevery 2 monthssince Nov 2020.She responded well but has had elevated proteinuria. Her 24 urine showed protein at '2739mg'$ on 03/09/19. Avastin was  Reduced to every 2 months in 12/2018 and then held since 03/09/19-05/26/19 due to high proteinuria.  Her proteinuria is also related to her DM. I have recommend she f/u with Dr. Dwyane Dee.  -Since being off Avastin her anemia progressed. She was able to get one dose Avastin on 05/26/19 and 06/23/19. -Labs reviewed, HG 10.3, Ferritin and urine protein still pending. I will given IV Feraheme today, she does not need Blood transfusion today. If protein urine above 300 will hold Avastin today.  -continue IVFeraheme if ferritin<100andContinue oral iron. -F/u in 8 weeks   2. 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 Will continue Avastin when protein urine below 300.   3.Iron deficiency anemia secondary to #2  -She does not tolerate oral ironwell but is on it -will continueIV Feraheme if ferritin less than 50.Last IV Feraheme8/3/20.  4.Dementia -She takes Ariceptand memantine -f/u with neurology. She has not been seen in some time. 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 -I encouraged her husband to come in for visit. He is agreeable.   5. DM,CHFand CKD stage III -She is on insulin, DM not well controlled.She is seen by Dr. Dwyane Dee -She will continue to monitorher blood glucose at home -CKD andCHF stable clinically. -Her BG has fluctuated lately ranging from 400 to 51. I highly recommend she see Dr. Dwyane Dee soon for better management and refill of insulin.  6.Hypertension, secondary to Avastin -She has not been on HTN medication before.  -Per pt and her husbandSBP is elevated in 150-170 range at home.  -On 01/02/19 I started her on'5mg'$  amlodipinewith mild improvement. -continue  monitoring  7. Worsening proteinuria -Likely related to Avastin, she has diabetes, which can cause proteinuria also -Her 24 urine showed protein at 2739 on 03/07/19. Will hold avastin. -We will Reach Out her PCP and endocrinologist to see ifACE would be a good option for her.  -Urine protein  remains high but given worsened anemia will give her Avastin as needed.  -Continue to f/u with Dr. Dwyane Dee   8. Diffuse Skin lesions of B/l legs  -Seen on 06/23/10 exam. Per pt she has had this for some time (months) and seen dermatologist before.  -Stable on today's exam (08/18/19). I again recommend she f/u with dermatologist soon.   Plan -will proceed with IV Feraheme today. Will hold Avatin due to significant proteinuria -Lab and infusion in 4 weeks -lab and f/u in 8 weeks, will set up iv feraheme if ferritin<100  No problem-specific Assessment & Plan notes found for this encounter.   No orders of the defined types were placed in this encounter.  All questions were answered. The patient knows to call the clinic with any problems, questions or concerns. No barriers to learning was detected. The total time spent in the appointment was 25 minutes.     Truitt Merle, MD 08/18/2019   I, Joslyn Devon, am acting as scribe for Truitt Merle, MD.   I have reviewed the above documentation for accuracy and completeness, and I agree with the above.

## 2019-08-11 NOTE — Progress Notes (Signed)
Internal Medicine Clinic Attending  Case discussed with Dr. Basaraba at the time of the visit.  We reviewed the resident's history and exam and pertinent patient test results.  I agree with the assessment, diagnosis, and plan of care documented in the resident's note.    

## 2019-08-12 ENCOUNTER — Other Ambulatory Visit: Payer: Self-pay | Admitting: Internal Medicine

## 2019-08-16 ENCOUNTER — Ambulatory Visit (INDEPENDENT_AMBULATORY_CARE_PROVIDER_SITE_OTHER): Payer: Medicare Other | Admitting: Internal Medicine

## 2019-08-16 ENCOUNTER — Encounter: Payer: Self-pay | Admitting: Internal Medicine

## 2019-08-16 VITALS — BP 151/58 | HR 61 | Wt 155.0 lb

## 2019-08-16 DIAGNOSIS — M79604 Pain in right leg: Secondary | ICD-10-CM

## 2019-08-16 DIAGNOSIS — I5032 Chronic diastolic (congestive) heart failure: Secondary | ICD-10-CM | POA: Diagnosis not present

## 2019-08-16 DIAGNOSIS — M79605 Pain in left leg: Secondary | ICD-10-CM | POA: Diagnosis not present

## 2019-08-16 MED ORDER — GABAPENTIN 100 MG PO CAPS
100.0000 mg | ORAL_CAPSULE | Freq: Three times a day (TID) | ORAL | 2 refills | Status: DC
Start: 2019-08-16 — End: 2019-12-25

## 2019-08-16 MED ORDER — FUROSEMIDE 40 MG PO TABS: 60.0000 mg | ORAL_TABLET | Freq: Every day | ORAL | 0 refills | Status: DC

## 2019-08-16 NOTE — Patient Instructions (Signed)
[name],  It was nice seeing you!  Today we discussed:  -fluid on your legs: we will keep you on the Lasix 60 mg daily for another week. I'm checking labs today to be sure your kidney function is stable.   -leg pain: try taking Gabapentin 100 mg three times daily. Do NOT take with Percocet (pain pill) at night.   We'll see you back in 1 week to see how things are progressing. Continue to weigh yourself daily.   Take care,  Dr. Koleen Distance

## 2019-08-17 ENCOUNTER — Encounter: Payer: Self-pay | Admitting: Internal Medicine

## 2019-08-17 ENCOUNTER — Telehealth: Payer: Self-pay | Admitting: Internal Medicine

## 2019-08-17 ENCOUNTER — Other Ambulatory Visit: Payer: Self-pay | Admitting: Internal Medicine

## 2019-08-17 LAB — BMP8+ANION GAP
Anion Gap: 12 mmol/L (ref 10.0–18.0)
BUN/Creatinine Ratio: 9 — ABNORMAL LOW (ref 12–28)
BUN: 10 mg/dL (ref 8–27)
CO2: 28 mmol/L (ref 20–29)
Calcium: 8.4 mg/dL — ABNORMAL LOW (ref 8.7–10.3)
Chloride: 103 mmol/L (ref 96–106)
Creatinine, Ser: 1.08 mg/dL — ABNORMAL HIGH (ref 0.57–1.00)
GFR calc Af Amer: 56 mL/min/{1.73_m2} — ABNORMAL LOW (ref >59)
GFR calc non Af Amer: 49 mL/min/{1.73_m2} — ABNORMAL LOW (ref >59)
Glucose: 189 mg/dL — ABNORMAL HIGH (ref 65–99)
Potassium: 2.7 mmol/L — ABNORMAL LOW (ref 3.5–5.2)
Sodium: 143 mmol/L (ref 134–144)

## 2019-08-17 MED ORDER — POTASSIUM CHLORIDE 20 MEQ/15ML (10%) PO SOLN: 20.0000 meq | Freq: Two times a day (BID) | ORAL | 0 refills | Status: DC

## 2019-08-17 NOTE — Telephone Encounter (Signed)
Spoke with patient's husband regarding low potassium seen on BMP from yesterday's visit. Instructions given for how to take potassium supplementation that was sent into pharmacy. She is scheduled for close follow-up next week and will re-check labs at that time.

## 2019-08-17 NOTE — Assessment & Plan Note (Signed)
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 Recently seen by vascular to re-evaluate any need for intervention. Her ABIs were essentially unchanged. Since she did not have much rest pain or non-healing wounds they did not recommend invasive therapy at this time.  Suspect neuropathy may be playing bigger role. Advised her to start taking Gabapentin 100 mg TID. She endorsed understanding that she should NOT take this with Percocet to avoid oversedation or potential harmful side effects.

## 2019-08-17 NOTE — Progress Notes (Signed)
Established Patient Office Visit  Subjective:  Patient ID: [name], female    DOB: February 03, 1941  Age: 79 y.o. MRN: [id number]  CC:  Chief Complaint  Patient presents with  . 1wk follow up    f/u swelling -now better  . Leg Pain    bilateral leg pain    HPI [name] presents for follow-up on acute HFpEF exacerbation and chronic leg pain. Please see problem based charting for further details.   Past Medical History:  Diagnosis Date  . Chronic anemia   . Chronic diastolic CHF (congestive heart failure) ([place]) 10/03/2013  . Chronic GI bleeding    [name] 11/29/2014  . Family history of anesthesia complication    "niece has a hard time coming out" (09/15/2012)  . Frequent nosebleeds    chronic  . Gastric AV malformation    [name] 11/29/2014  . GERD (gastroesophageal reflux disease)   . 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 History of blood transfusion "several"  . History of epistaxis   . HTN (hypertension), benign 03/02/2012  . Hyperlipidemia   . Iron deficiency anemia    chronic infusions"  . Lichen planus    Both lower extremities  . 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 Overgrown toenails 03/18/2017  . Pneumonia 1990's X 2  . Pulmonary HTN ([place]) 04/01/2017   PASP 38mHg on echo 09/2016 and 412mg by echo 2019  . [place]  . Symptomatic anemia 11/29/2014  . Telangiectasia    Gastric   . 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    Past Surgical History:  Procedure Laterality Date  . CATARACT EXTRACTION     "I think it was just one eye"  . ESOPHAGOGASTRODUODENOSCOPY  02/26/2011   Procedure: ESOPHAGOGASTRODUODENOSCOPY (EGD);  Surgeon: [name];  [facility];  Service: Endoscopy;  Laterality: N/A;  . ESOPHAGOGASTRODUODENOSCOPY N/A 11/08/2012   Procedure: ESOPHAGOGASTRODUODENOSCOPY (EGD);  Surgeon: [name];  [facility];  Service: Endoscopy;  Laterality: N/A;   . ESOPHAGOGASTRODUODENOSCOPY N/A 10/04/2013   Procedure: ESOPHAGOGASTRODUODENOSCOPY (EGD);  Surgeon: [name];  [facility];  Service: Endoscopy;  Laterality: N/A;  with APC on stand-by  . ESOPHAGOGASTRODUODENOSCOPY N/A 07/06/2014   Procedure: ESOPHAGOGASTRODUODENOSCOPY (EGD);  Surgeon: [name];  [facility];  Service: Endoscopy;  Laterality: N/A;  . ESOPHAGOGASTRODUODENOSCOPY N/A 09/05/2014   Procedure: ESOPHAGOGASTRODUODENOSCOPY (EGD);  Surgeon: JaLaurence SpatesMD;  [facility];  Service: Endoscopy;  Laterality: N/A;  APC on standby to control bleeding  . ESOPHAGOGASTRODUODENOSCOPY N/A 11/29/2014   Procedure: ESOPHAGOGASTRODUODENOSCOPY (EGD);  Surgeon: [name];  [facility];  Service: Endoscopy;  Laterality: N/A;  . ESOPHAGOGASTRODUODENOSCOPY N/A 09/28/2015   Procedure: ESOPHAGOGASTRODUODENOSCOPY (EGD);  Surgeon: [name];  [facility];  Service: Endoscopy;  Laterality: N/A;  . ESOPHAGOGASTRODUODENOSCOPY (EGD) WITH PROPOFOL N/A 12/04/2017   Procedure: ESOPHAGOGASTRODUODENOSCOPY (EGD) WITH PROPOFOL;  Surgeon: [name];  [facility] Service: [facility];  Laterality: N/A;  . ESOPHAGOGASTRODUODENOSCOPY ENDOSCOPY  08/19/2006   with laser treatment  . HOT HEMOSTASIS N/A 07/06/2014   Procedure: HOT HEMOSTASIS (ARGON PLASMA COAGULATION/BICAP);  Surgeon: [name];  [facility];  Service: Endoscopy;  Laterality: N/A;  . HOT HEMOSTASIS N/A 09/28/2015   Procedure: HOT HEMOSTASIS (ARGON PLASMA COAGULATION/BICAP);  Surgeon: [name];  [facility];  Service: Endoscopy;  Laterality: N/A;  .  HOT HEMOSTASIS N/A 12/04/2017   Procedure: HOT HEMOSTASIS (ARGON PLASMA COAGULATION/BICAP);  Surgeon: [name], MD;  Location: [place];  Service: Endoscopy;  Laterality: N/A;  . NASAL HEMORRHAGE CONTROL     "for bleeding"   . SAVORY DILATION  02/26/2011    Procedure: SAVORY DILATION;  Surgeon: [name], MD;  [name];  Service: Endoscopy;  Laterality: N/A;  c-arm needed  . SUBMUCOSAL INJECTION  12/04/2017   Procedure: SUBMUCOSAL INJECTION;  Surgeon: [name], MD;  [facility];  Service: Endoscopy;;    Family History  Problem Relation Age of Onset  . Stroke Father   . Healthy Mother   . Breast cancer Other   . Diabetes Niece   . Malignant hyperthermia Neg Hx   . Seizures Neg Hx     Social History   Socioeconomic History  . Marital status: Married    Spouse name: [name]  . Number of children: 0  . Years of education: 9  . Highest education level: Not on file  Occupational History  . Occupation: Retired Systems developer: RETIRED  Tobacco Use  . Smoking status: Former Smoker    Packs/day: 1.00    Years: 20.00    Pack years: 20.00    Types: Cigarettes    Quit date: 02/10/1971    Years since quitting: 48.5  . Smokeless tobacco: Never Used  . Tobacco comment: 09/15/2012 "smoked 50-60 yr ago"  Vaping Use  . Vaping Use: Never used  Substance and Sexual Activity  . Alcohol use: No    Alcohol/week: 0.0 standard drinks  . Drug use: No  . Sexual activity: Not on file  Other Topics Concern  . Not on file  Social History Narrative   Married, lives with husband, [name].  Ambulates without assistance.     Caffeine use: none   Social Determinants of Radio broadcast assistant Strain:   . Difficulty of Paying Living Expenses:   Food Insecurity:   . Worried About Charity fundraiser in the Last Year:   . Arboriculturist in the Last Year:   Transportation Needs:   . Film/video editor (Medical):   Marland Kitchen Lack of Transportation (Non-Medical):   Physical Activity:   . Days of Exercise per Week:   . Minutes of Exercise per Session:   Stress:   . Feeling of Stress :   Social Connections:   . Frequency of Communication with Friends and Family:   . Frequency of Social Gatherings with  Friends and Family:   . Attends Religious Services:   . Active Member of Clubs or Organizations:   . Attends Archivist Meetings:   Marland Kitchen Marital Status:   Intimate Partner Violence:   . Fear of Current or Ex-Partner:   . Emotionally Abused:   Marland Kitchen Physically Abused:   . Sexually Abused:     Outpatient Medications Prior to Visit  Medication Sig Dispense Refill  . amLODipine (NORVASC) 10 MG tablet Take 1 tablet by mouth once daily 90 tablet 1  . b complex vitamins capsule Take 1 capsule by mouth daily. 100 capsule 2  . Blood Glucose Monitoring Suppl (ONETOUCH VERIO) w/Device KIT Use to check blood sugar up to 4 times a day 1 kit 1  . calcium-vitamin D (OSCAL WITH D) 500-200 MG-UNIT tablet Take 1 tablet by mouth 2 (two) times daily.    . cetaphil (CETAPHIL) lotion Apply 1 application topically as needed for dry  skin. 236 mL 0  . donepezil (ARICEPT) 10 MG tablet Take 1 tablet (10 mg total) by mouth at bedtime. 90 tablet 4  . Dulaglutide (TRULICITY) 3 GN/5.6OZ SOPN Inject 3 mg into the skin once a week. 4 pen 2  . Elastic Bandages & Supports (MEDICAL COMPRESSION STOCKINGS) MISC Wear as much as possible while awake to reduce swelling 2 each 0  . FERROCITE 324 MG TABS tablet Take 1 tablet by mouth twice daily 60 tablet 0  . glucose blood (ONETOUCH VERIO) test strip Use as instructed 4 times daily. E11.29, insulin requiring 350 each 3  . Insulin Pen Needle (B-D UF III MINI PEN NEEDLES) 31G X 5 MM MISC USE TO INJECT INSULIN 3 TIMES A DAY 100 each 0  . lactulose (CHRONULAC) 10 GM/15ML solution TAKE 15 ML BY MOUTH TWO TIMES DAILY 473 mL 2  . levETIRAcetam (KEPPRA) 500 MG tablet Take 1 tablet (500 mg total) by mouth 2 (two) times daily. 180 tablet 4  . memantine (NAMENDA) 10 MG tablet Take 1 tablet (10 mg total) by mouth 2 (two) times daily. 180 tablet 4  . ONETOUCH DELICA LANCETS FINE MISC Check blood sugar up to 4 times a day 300 each 4  . oxycodone-acetaminophen (PERCOCET) 2.5-325 MG tablet  Take 1 tablet by mouth at bedtime as needed for pain. 30 tablet 0  . oxymetazoline (AFRIN) 0.05 % nasal spray Place 1 spray into both nostrils 2 (two) times daily as needed (Epistaxis). 30 mL 0  . pantoprazole (PROTONIX) 40 MG tablet Take 1 tablet by mouth once daily 180 tablet 0  . sodium chloride (OCEAN) 0.65 % SOLN nasal spray Place 1 spray into both nostrils as needed for congestion. 60 mL 0  . TRESIBA FLEXTOUCH 100 UNIT/ML FlexTouch Pen INJECT 21 UNITS SUBCUTANEOUSLY IN THE EVENING 15 mL 0  . triamcinolone ointment (KENALOG) 0.5 % APPLY 1 APPLICATION TOPICALLY TO RASH TWICE DAILY FOR ITCHING (Patient taking differently: Apply 1 application topically 2 (two) times daily as needed (itching). ) 15 g 1  . furosemide (LASIX) 20 MG tablet Take 1 tablet (20 mg total) by mouth daily. 90 tablet 3  . furosemide (LASIX) 40 MG tablet Take 1.5 tablets (60 mg total) by mouth daily for 7 days. 10.5 tablet 0  . gabapentin (NEURONTIN) 100 MG capsule Take 1 capsule (100 mg total) by mouth 2 (two) times daily as needed. 30 capsule 0   No facility-administered medications prior to visit.    Allergies  Allergen Reactions  . Aspirin Nausea And Vomiting    ROS Review of Systems  Constitutional: Negative for activity change, appetite change, chills and fever.  HENT: Negative for sinus pain, sore throat and trouble swallowing.   Eyes: Negative for visual disturbance.  Respiratory: Negative for cough and shortness of breath.   Cardiovascular: Negative for chest pain.  Gastrointestinal: Negative for abdominal pain and nausea.  Genitourinary: Negative for difficulty urinating.  Musculoskeletal: Positive for arthralgias.  Skin: Negative for wound.  Neurological: Negative for dizziness, syncope, speech difficulty, weakness, numbness and headaches.  Psychiatric/Behavioral: Negative for behavioral problems and sleep disturbance.      Objective:    Physical Exam Constitutional:      General: She is not in  acute distress.    Appearance: Normal appearance.  Eyes:     Conjunctiva/sclera: Conjunctivae normal.  Cardiovascular:     Rate and Rhythm: Normal rate and regular rhythm.     Heart sounds: Murmur heard.  Systolic murmur is present  with a grade of 3/6.   Pulmonary:     Effort: Pulmonary effort is normal.     Breath sounds: Normal breath sounds.  Abdominal:     General: There is no distension.     Palpations: Abdomen is soft.     Tenderness: There is no abdominal tenderness.  Musculoskeletal:     Right lower leg: 2+ Edema present.     Left lower leg: 2+ Edema present.  Neurological:     General: No focal deficit present.     Mental Status: She is alert and oriented to person, place, and time.  Psychiatric:        Mood and Affect: Mood normal.        Behavior: Behavior normal.     BP (!) 151/58 (BP Location: Left Arm, Patient Position: Sitting, Cuff Size: Normal)   Pulse 61   Wt 155 lb (70.3 kg)   LMP  (LMP Unknown)   SpO2 98%   BMI 27.46 kg/m  Wt Readings from Last 3 Encounters:  08/16/19 155 lb (70.3 kg)  08/07/19 164 lb 1.6 oz (74.4 kg)  08/02/19 170 lb 3.2 oz (77.2 kg)     Health Maintenance Due  Topic Date Due  . COVID-19 Vaccine (1) Never done  . PNA vac Low Risk Adult (1 of 2 - PCV13) 03/16/2005  . TETANUS/TDAP  02/09/2013  . MAMMOGRAM  01/13/2019  . FOOT EXAM  04/09/2019    There are no preventive care reminders to display for this patient.  Lab Results  Component Value Date   TSH 1.730 01/30/2015   Lab Results  Component Value Date   WBC 8.7 07/21/2019   HGB 11.6 (L) 07/21/2019   HCT 35.9 (L) 07/21/2019   MCV 88.2 07/21/2019   PLT 184 07/21/2019   Lab Results  Component Value Date   NA 143 08/16/2019   K 2.7 (L) 08/16/2019   CHLORIDE 109 04/04/2014   CO2 28 08/16/2019   GLUCOSE 189 (H) 08/16/2019   BUN 10 08/16/2019   CREATININE 1.08 (H) 08/16/2019   BILITOT 0.6 08/07/2019   ALKPHOS 331 (H) 08/07/2019   AST 39 08/07/2019   ALT 19  08/07/2019   PROT 5.1 (L) 08/07/2019   ALBUMIN 2.9 (L) 08/07/2019   CALCIUM 8.4 (L) 08/16/2019   ANIONGAP 11 07/21/2019   EGFR 76 (L) 04/04/2014   GFR 57.33 (L) 06/26/2019   Lab Results  Component Value Date   CHOL 133 01/30/2019   Lab Results  Component Value Date   HDL 41.30 01/30/2019   Lab Results  Component Value Date   LDLCALC 57 01/30/2019   Lab Results  Component Value Date   TRIG 176.0 (H) 01/30/2019   Lab Results  Component Value Date   CHOLHDL 3 01/30/2019   Lab Results  Component Value Date   HGBA1C 5.6 06/26/2019      Assessment & Plan:   Problem List Items Addressed This Visit      Cardiovascular and Mediastinum   Chronic diastolic CHF (congestive heart failure) ([place]) - Primary    Patient was first evaluated on 6/23 for volume overload likely in the setting of mistakenly discontinuing chronic lasix. She has done well with outpatient management. Weight has gone down 15 lbs since initiating treatment. Most recently has been on Lasix 60 mg daily which she is tolerating well. Her upper extremity swelling has resolved, but still has bilateral pitting edema in lower extremities. Per chart review, likely has 8 more pounds to  go to get to dry weight. Will continue Lasix 60 mg daily for another week and re-evaluate at that time.  BMP shows stable renal function, but she does have some hypokalemia. Rx sent for supplementation. Will re-check at follow-up next week.       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 Recently seen by vascular to re-evaluate any need for intervention. Her ABIs were essentially unchanged. Since she did not have much rest pain or non-healing wounds they did not recommend invasive therapy at this time.  Suspect neuropathy may be playing bigger role. Advised her to start  taking Gabapentin 100 mg TID. She endorsed understanding that she should NOT take this with Percocet to avoid oversedation or potential harmful side effects.          Meds ordered this encounter  Medications  . furosemide (LASIX) 40 MG tablet    Sig: Take 1.5 tablets (60 mg total) by mouth daily for 7 days.    Dispense:  10.5 tablet    Refill:  0  . gabapentin (NEURONTIN) 100 MG capsule    Sig: Take 1 capsule (100 mg total) by mouth 3 (three) times daily.    Dispense:  90 capsule    Refill:  2    Follow-up: Return in about 1 week (around 08/23/2019).    Delice Bison, DO

## 2019-08-17 NOTE — Assessment & Plan Note (Signed)
Patient was first evaluated on 6/23 for volume overload likely in the setting of mistakenly discontinuing chronic lasix. She has done well with outpatient management. Weight has gone down 15 lbs since initiating treatment. Most recently has been on Lasix 60 mg daily which she is tolerating well. Her upper extremity swelling has resolved, but still has bilateral pitting edema in lower extremities. Per chart review, likely has 8 more pounds to go to get to dry weight. Will continue Lasix 60 mg daily for another week and re-evaluate at that time.  BMP shows stable renal function, but she does have some hypokalemia. Rx sent for supplementation. Will re-check at follow-up next week.

## 2019-08-18 ENCOUNTER — Inpatient Hospital Stay: Payer: Medicare Other

## 2019-08-18 ENCOUNTER — Inpatient Hospital Stay: Payer: Medicare Other | Attending: Hematology

## 2019-08-18 ENCOUNTER — Other Ambulatory Visit: Payer: Self-pay

## 2019-08-18 ENCOUNTER — Inpatient Hospital Stay (HOSPITAL_BASED_OUTPATIENT_CLINIC_OR_DEPARTMENT_OTHER): Payer: Medicare Other | Admitting: Hematology

## 2019-08-18 ENCOUNTER — Encounter: Payer: Self-pay | Admitting: Hematology

## 2019-08-18 DIAGNOSIS — K31811 Angiodysplasia of stomach and duodenum with bleeding: Secondary | ICD-10-CM

## 2019-08-18 DIAGNOSIS — R040 Epistaxis: Secondary | ICD-10-CM | POA: Diagnosis not present

## 2019-08-18 DIAGNOSIS — K219 Gastro-esophageal reflux disease without esophagitis: Secondary | ICD-10-CM | POA: Insufficient documentation

## 2019-08-18 DIAGNOSIS — E785 Hyperlipidemia, unspecified: Secondary | ICD-10-CM | POA: Insufficient documentation

## 2019-08-18 DIAGNOSIS — Z794 Long term (current) use of insulin: Secondary | ICD-10-CM | POA: Diagnosis not present

## 2019-08-18 DIAGNOSIS — M79604 Pain in right leg: Secondary | ICD-10-CM | POA: Diagnosis not present

## 2019-08-18 DIAGNOSIS — I5032 Chronic diastolic (congestive) heart failure: Secondary | ICD-10-CM | POA: Diagnosis not present

## 2019-08-18 DIAGNOSIS — D500 Iron deficiency anemia secondary to blood loss (chronic): Secondary | ICD-10-CM

## 2019-08-18 DIAGNOSIS — K31819 Angiodysplasia of stomach and duodenum without bleeding: Secondary | ICD-10-CM

## 2019-08-18 DIAGNOSIS — F0390 Unspecified dementia without behavioral disturbance: Secondary | ICD-10-CM | POA: Insufficient documentation

## 2019-08-18 DIAGNOSIS — Z79899 Other long term (current) drug therapy: Secondary | ICD-10-CM | POA: Insufficient documentation

## 2019-08-18 DIAGNOSIS — I130 Hypertensive heart and chronic kidney disease with heart failure and stage 1 through stage 4 chronic kidney disease, or unspecified chronic kidney disease: Secondary | ICD-10-CM | POA: Diagnosis not present

## 2019-08-18 DIAGNOSIS — M79605 Pain in left leg: Secondary | ICD-10-CM | POA: Insufficient documentation

## 2019-08-18 DIAGNOSIS — I780 Hereditary hemorrhagic telangiectasia: Secondary | ICD-10-CM

## 2019-08-18 DIAGNOSIS — Z95828 Presence of other vascular implants and grafts: Secondary | ICD-10-CM

## 2019-08-18 DIAGNOSIS — R21 Rash and other nonspecific skin eruption: Secondary | ICD-10-CM | POA: Insufficient documentation

## 2019-08-18 DIAGNOSIS — N189 Chronic kidney disease, unspecified: Secondary | ICD-10-CM | POA: Insufficient documentation

## 2019-08-18 DIAGNOSIS — E1022 Type 1 diabetes mellitus with diabetic chronic kidney disease: Secondary | ICD-10-CM | POA: Insufficient documentation

## 2019-08-18 DIAGNOSIS — Q2733 Arteriovenous malformation of digestive system vessel: Secondary | ICD-10-CM | POA: Diagnosis not present

## 2019-08-18 DIAGNOSIS — D509 Iron deficiency anemia, unspecified: Secondary | ICD-10-CM

## 2019-08-18 LAB — CBC WITH DIFFERENTIAL (CANCER CENTER ONLY)
Abs Immature Granulocytes: 0.03 10*3/uL (ref 0.00–0.07)
Basophils Absolute: 0 10*3/uL (ref 0.0–0.1)
Basophils Relative: 1 %
Eosinophils Absolute: 0.3 10*3/uL (ref 0.0–0.5)
Eosinophils Relative: 5 %
HCT: 32.3 % — ABNORMAL LOW (ref 36.0–46.0)
Hemoglobin: 10.3 g/dL — ABNORMAL LOW (ref 12.0–15.0)
Immature Granulocytes: 0 %
Lymphocytes Relative: 10 %
Lymphs Abs: 0.7 10*3/uL (ref 0.7–4.0)
MCH: 26.9 pg (ref 26.0–34.0)
MCHC: 31.9 g/dL (ref 30.0–36.0)
MCV: 84.3 fL (ref 80.0–100.0)
Monocytes Absolute: 0.6 10*3/uL (ref 0.1–1.0)
Monocytes Relative: 9 %
Neutro Abs: 5.1 10*3/uL (ref 1.7–7.7)
Neutrophils Relative %: 75 %
Platelet Count: 215 10*3/uL (ref 150–400)
RBC: 3.83 MIL/uL — ABNORMAL LOW (ref 3.87–5.11)
RDW: 14.7 % (ref 11.5–15.5)
WBC Count: 6.7 10*3/uL (ref 4.0–10.5)
nRBC: 0 % (ref 0.0–0.2)

## 2019-08-18 LAB — TOTAL PROTEIN, URINE DIPSTICK: Protein, ur: >2000 mg/dL — AB

## 2019-08-18 MED ORDER — SODIUM CHLORIDE 0.9 % IV SOLN
510.0000 mg | Freq: Once | INTRAVENOUS | Status: AC
  Administered 2019-08-18: 510 mg via INTRAVENOUS
  Filled 2019-08-18: qty 510

## 2019-08-18 MED ORDER — SODIUM CHLORIDE 0.9% FLUSH
10.0000 mL | INTRAVENOUS | Status: AC | PRN
  Administered 2019-08-18 (×2): 10 mL
  Filled 2019-08-18: qty 10

## 2019-08-18 MED ORDER — HEPARIN SOD (PORK) LOCK FLUSH 100 UNIT/ML IV SOLN
500.0000 [IU] | Freq: Once | INTRAVENOUS | Status: AC | PRN
  Administered 2019-08-18: 500 [IU]
  Filled 2019-08-18: qty 5

## 2019-08-18 MED ORDER — SODIUM CHLORIDE 0.9 % IV SOLN
Freq: Once | INTRAVENOUS | Status: AC
  Filled 2019-08-18: qty 250

## 2019-08-18 MED ORDER — SODIUM CHLORIDE 0.9 % IJ SOLN
10.0000 mL | INTRAMUSCULAR | Status: AC | PRN
  Filled 2019-08-18: qty 10

## 2019-08-18 NOTE — Patient Instructions (Addendum)
Take your potassium twice daily as prescribed.  For today per Dr Burr Medico may double dose this pm since dose not taken this am.    Ferumoxytol injection What is this medicine? FERUMOXYTOL is an iron complex. Iron is used to make healthy red blood cells, which carry oxygen and nutrients throughout the body. This medicine is used to treat iron deficiency anemia. This medicine may be used for other purposes; ask your health care provider or pharmacist if you have questions. COMMON BRAND NAME(S): Feraheme What should I tell my health care provider before I take this medicine? They need to know if you have any of these conditions:  anemia not caused by low iron levels  high levels of iron in the blood  magnetic resonance imaging (MRI) test scheduled  an unusual or allergic reaction to iron, other medicines, foods, dyes, or preservatives  pregnant or trying to get pregnant  breast-feeding How should I use this medicine? This medicine is for injection into a vein. It is given by a health care professional in a hospital or clinic setting. Talk to your pediatrician regarding the use of this medicine in children. Special care may be needed. Overdosage: If you think you have taken too much of this medicine contact a poison control center or emergency room at once. NOTE: This medicine is only for you. Do not share this medicine with others. What if I miss a dose? It is important not to miss your dose. Call your doctor or health care professional if you are unable to keep an appointment. What may interact with this medicine? This medicine may interact with the following medications:  other iron products This list may not describe all possible interactions. Give your health care provider a list of all the medicines, herbs, non-prescription drugs, or dietary supplements you use. Also tell them if you smoke, drink alcohol, or use illegal drugs. Some items may interact with your medicine. What should I  watch for while using this medicine? Visit your doctor or healthcare professional regularly. Tell your doctor or healthcare professional if your symptoms do not start to get better or if they get worse. You may need blood work done while you are taking this medicine. You may need to follow a special diet. Talk to your doctor. Foods that contain iron include: whole grains/cereals, dried fruits, beans, or peas, leafy green vegetables, and organ meats (liver, kidney). What side effects may I notice from receiving this medicine? Side effects that you should report to your doctor or health care professional as soon as possible:  allergic reactions like skin rash, itching or hives, swelling of the face, lips, or tongue  breathing problems  changes in blood pressure  feeling faint or lightheaded, falls  fever or chills  flushing, sweating, or hot feelings  swelling of the ankles or feet Side effects that usually do not require medical attention (report to your doctor or health care professional if they continue or are bothersome):  diarrhea  headache  nausea, vomiting  stomach pain This list may not describe all possible side effects. Call your doctor for medical advice about side effects. You may report side effects to FDA at 1-800-FDA-1088. Where should I keep my medicine? This drug is given in a hospital or clinic and will not be stored at home. NOTE: This sheet is a summary. It may not cover all possible information. If you have questions about this medicine, talk to your doctor, pharmacist, or health care provider.  2020 [employer]  2016-03-16 20:21:10)  

## 2019-08-21 ENCOUNTER — Telehealth: Payer: Self-pay | Admitting: Hematology

## 2019-08-21 LAB — FERRITIN: Ferritin: 32 ng/mL (ref 11–307)

## 2019-08-21 NOTE — Telephone Encounter (Signed)
Scheduled per 7/9 los. Pt is aware of appts added.

## 2019-08-22 NOTE — Progress Notes (Signed)
Internal Medicine Clinic Attending  Case discussed with Dr. Bloomfield  At the time of the visit.  We reviewed the resident's history and exam and pertinent patient test results.  I agree with the assessment, diagnosis, and plan of care documented in the resident's note.  

## 2019-08-23 ENCOUNTER — Ambulatory Visit (INDEPENDENT_AMBULATORY_CARE_PROVIDER_SITE_OTHER): Payer: Medicare Other | Admitting: Internal Medicine

## 2019-08-23 ENCOUNTER — Encounter: Payer: Self-pay | Admitting: Internal Medicine

## 2019-08-23 ENCOUNTER — Other Ambulatory Visit: Payer: Self-pay

## 2019-08-23 VITALS — BP 156/51 | HR 61 | Temp 98.5°F | Ht 63.0 in | Wt 155.4 lb

## 2019-08-23 DIAGNOSIS — I5033 Acute on chronic diastolic (congestive) heart failure: Secondary | ICD-10-CM | POA: Diagnosis not present

## 2019-08-23 DIAGNOSIS — I5032 Chronic diastolic (congestive) heart failure: Secondary | ICD-10-CM | POA: Diagnosis not present

## 2019-08-23 MED ORDER — FUROSEMIDE 40 MG PO TABS: 80.0000 mg | ORAL_TABLET | Freq: Every day | ORAL | 0 refills | Status: DC

## 2019-08-23 NOTE — Patient Instructions (Signed)
[name], It was good to see you again!  Today we discussed our plan to continue getting fluid off the legs.   We will plan to increase your fluid pill to 80 mg daily, but I want to see how your labs look first. I will call you when I have these results.   Let's continue our close follow-up and plan on coming back next week to see where we are.  If you can, try to do daily weights at home and bring them in for comparison!  Take care, Dr. Koleen Distance

## 2019-08-24 LAB — BMP8+ANION GAP
Anion Gap: 17 mmol/L (ref 10.0–18.0)
BUN/Creatinine Ratio: 11 — ABNORMAL LOW (ref 12–28)
BUN: 11 mg/dL (ref 8–27)
CO2: 26 mmol/L (ref 20–29)
Calcium: 8.7 mg/dL (ref 8.7–10.3)
Chloride: 106 mmol/L (ref 96–106)
Creatinine, Ser: 0.99 mg/dL (ref 0.57–1.00)
GFR calc Af Amer: 63 mL/min/{1.73_m2} (ref >59)
GFR calc non Af Amer: 54 mL/min/{1.73_m2} — ABNORMAL LOW (ref >59)
Glucose: 130 mg/dL — ABNORMAL HIGH (ref 65–99)
Potassium: 2.9 mmol/L — ABNORMAL LOW (ref 3.5–5.2)
Sodium: 149 mmol/L — ABNORMAL HIGH (ref 134–144)

## 2019-08-25 ENCOUNTER — Encounter: Payer: Self-pay | Admitting: Internal Medicine

## 2019-08-25 NOTE — Progress Notes (Signed)
Acute Office Visit  Subjective:    Patient ID: [name], female    DOB: 01-02-1941, 79 y.o.   MRN: [id number]  Chief Complaint  Patient presents with  . Follow-up    HPI Patient is in today for follow-up on acute on chronic HFpEF. Please see problem based charting for further details.   Past Medical History:  Diagnosis Date  . Chronic anemia   . Chronic diastolic CHF (congestive heart failure) ([place]) 10/03/2013  . Chronic GI bleeding    [name] 11/29/2014  . Family history of anesthesia complication    "niece has a hard time coming out" (09/15/2012)  . Frequent nosebleeds    chronic  . Gastric AV malformation    [name] 11/29/2014  . GERD (gastroesophageal reflux disease)   . 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 History of blood transfusion "several"  . History of epistaxis   . HTN (hypertension), benign 03/02/2012  . Hyperlipidemia   . Iron deficiency anemia    chronic infusions"  . Lichen planus    Both lower extremities  . 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 Overgrown toenails 03/18/2017  . Pneumonia 1990's X 2  . Pulmonary HTN ([place]) 04/01/2017   PASP 52mHg on echo 09/2016 and 454mg by echo 2019  . Se[place] [place]  . Symptomatic anemia 11/29/2014  . Telangiectasia    Gastric   . 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    Past Surgical History:  Procedure Laterality Date  . CATARACT EXTRACTION     "I think it was just one eye"  . ESOPHAGOGASTRODUODENOSCOPY  02/26/2011   Procedure: ESOPHAGOGASTRODUODENOSCOPY (EGD);  Surgeon: [name];  [facility];  Service: Endoscopy;  Laterality: N/A;  . ESOPHAGOGASTRODUODENOSCOPY N/A 11/08/2012   Procedure: ESOPHAGOGASTRODUODENOSCOPY (EGD);  Surgeon: [name];  [facility];  Service: Endoscopy;  Laterality: N/A;  . ESOPHAGOGASTRODUODENOSCOPY N/A 10/04/2013   Procedure: ESOPHAGOGASTRODUODENOSCOPY (EGD);  Surgeon: [name];  [facility];  Service: Endoscopy;  Laterality: N/A;  with APC on stand-by  . ESOPHAGOGASTRODUODENOSCOPY N/A 07/06/2014   Procedure: ESOPHAGOGASTRODUODENOSCOPY (EGD);  Surgeon: [name];  [facility];  Service: Endoscopy;  Laterality: N/A;  . ESOPHAGOGASTRODUODENOSCOPY N/A 09/05/2014   Procedure: ESOPHAGOGASTRODUODENOSCOPY (EGD);  Surgeon: JaLaurence SpatesMD;  [facility];  Service: Endoscopy;  Laterality: N/A;  APC on standby to control bleeding  . ESOPHAGOGASTRODUODENOSCOPY N/A 11/29/2014   Procedure: ESOPHAGOGASTRODUODENOSCOPY (EGD);  Surgeon: [name];  [facility];  Service: Endoscopy;  Laterality: N/A;  . ESOPHAGOGASTRODUODENOSCOPY N/A 09/28/2015   Procedure: ESOPHAGOGASTRODUODENOSCOPY (EGD);  Surgeon: [name];  [name];  Service: Endoscopy;  Laterality: N/A;  . ESOPHAGOGASTRODUODENOSCOPY (EGD) WITH PROPOFOL N/A 12/04/2017   Procedure: ESOPHAGOGASTRODUODENOSCOPY (EGD) WITH PROPOFOL;  Surgeon: [name];  [facility];  Laterality: N/A;  . ESOPHAGOGASTRODUODENOSCOPY ENDOSCOPY  08/19/2006   with laser treatment  . HOT HEMOSTASIS N/A 07/06/2014   Procedure: HOT HEMOSTASIS (ARGON PLASMA COAGULATION/BICAP);  Surgeon: [name];  [facility];  Service: Endoscopy;  Laterality: N/A;  . HOT HEMOSTASIS N/A 09/28/2015   Procedure: HOT HEMOSTASIS (ARGON PLASMA COAGULATION/BICAP);  Surgeon: [name];  [facility];  Service: Endoscopy;  Laterality: N/A;  . HOT HEMOSTASIS N/A 12/04/2017   Procedure: HOT HEMOSTASIS (ARGON PLASMA COAGULATION/BICAP);  Surgeon: [name];  [place]:  Endoscopy;  Laterality: N/A;  . NASAL HEMORRHAGE CONTROL     "for bleeding"   . SAVORY DILATION  02/26/2011   Procedure: SAVORY DILATION;  Surgeon: Barrie Folk, MD;  [name];  Service: Endoscopy;  Laterality:  N/A;  c-arm needed  . SUBMUCOSAL INJECTION  12/04/2017   Procedure: SUBMUCOSAL INJECTION;  Surgeon: Charlott Rakes, MD;  [facility];  Service: Endoscopy;;    Family History  Problem Relation Age of Onset  . Stroke Father   . Healthy Mother   . Breast cancer Other   . Diabetes Niece   . Malignant hyperthermia Neg Hx   . Seizures Neg Hx     Social History   Socioeconomic History  . Marital status: Married    Spouse name: Jonny Ruiz  . Number of children: 0  . Years of education: 9  . Highest education level: Not on file  Occupational History  . Occupation: Retired Tax adviser: RETIRED  Tobacco Use  . Smoking status: Former Smoker    Packs/day: 1.00    Years: 20.00    Pack years: 20.00    Types: Cigarettes    Quit date: 02/10/1971    Years since quitting: 48.5  . Smokeless tobacco: Never Used  . Tobacco comment: 09/15/2012 "smoked 50-60 yr ago"  Vaping Use  . Vaping Use: Never used  Substance and Sexual Activity  . Alcohol use: No    Alcohol/week: 0.0 standard drinks  . Drug use: No  . Sexual activity: Not on file  Other Topics Concern  . Not on file  Social History Narrative   Married, lives with husband, Jonny Ruiz.  Ambulates without assistance.     Caffeine use: none   Social Determinants of Corporate investment banker Strain:   . Difficulty of Paying Living Expenses:   Food Insecurity:   . Worried About Programme researcher, broadcasting/film/video in the Last Year:   . Barista in the Last Year:   Transportation Needs:   . Freight forwarder (Medical):   Marland Kitchen Lack of Transportation (Non-Medical):   Physical Activity:   . Days of Exercise per Week:   . Minutes of Exercise per Session:   Stress:   . Feeling of Stress :   Social Connections:   . Frequency of Communication with Friends and Family:   . Frequency of Social Gatherings with Friends and Family:   . Attends Religious Services:   . Active Member of Clubs or Organizations:   . Attends Tax inspector Meetings:   Marland Kitchen Marital Status:   Intimate Partner Violence:   . Fear of Current or Ex-Partner:   . Emotionally Abused:   Marland Kitchen Physically Abused:   . Sexually Abused:     Outpatient Medications Prior to Visit  Medication Sig Dispense Refill  . amLODipine (NORVASC) 10 MG tablet Take 1 tablet by mouth once daily 90 tablet 1  . b complex vitamins capsule Take 1 capsule by mouth daily. 100 capsule 2  . Blood Glucose Monitoring Suppl (ONETOUCH VERIO) w/Device KIT Use to check blood sugar up to 4 times a day 1 kit 1  . calcium-vitamin D (OSCAL WITH D) 500-200 MG-UNIT tablet Take 1 tablet by mouth 2 (two) times daily.    . cetaphil (CETAPHIL) lotion Apply 1 application topically as needed for dry skin. 236 mL 0  . donepezil (ARICEPT) 10 MG tablet Take 1 tablet (10 mg total) by mouth at bedtime. 90 tablet  4  . Dulaglutide (TRULICITY) 3 BH/4.1PF SOPN Inject 3 mg into the skin once a week. 4 pen 2  . Elastic Bandages & Supports (MEDICAL COMPRESSION STOCKINGS) MISC Wear as much as possible while awake to reduce swelling 2 each 0  . FERROCITE 324 MG TABS tablet Take 1 tablet by mouth twice daily 60 tablet 0  . gabapentin (NEURONTIN) 100 MG capsule Take 1 capsule (100 mg total) by mouth 3 (three) times daily. 90 capsule 2  . glucose blood (ONETOUCH VERIO) test strip Use as instructed 4 times daily. E11.29, insulin requiring 350 each 3  . Insulin Pen Needle (B-D UF III MINI PEN NEEDLES) 31G X 5 MM MISC USE TO INJECT INSULIN 3 TIMES A DAY 100 each 0  . lactulose (CHRONULAC) 10 GM/15ML solution TAKE 15 ML BY MOUTH TWO TIMES DAILY 473 mL 2  . levETIRAcetam (KEPPRA) 500 MG tablet Take 1 tablet (500 mg total) by mouth 2 (two) times daily. 180 tablet 4  . memantine (NAMENDA) 10 MG tablet Take 1 tablet (10 mg total) by mouth 2 (two) times daily. 180 tablet 4  . ONETOUCH DELICA LANCETS FINE MISC Check blood sugar up to 4 times a day 300 each 4  . oxycodone-acetaminophen (PERCOCET) 2.5-325 MG tablet Take  1 tablet by mouth at bedtime as needed for pain. 30 tablet 0  . oxymetazoline (AFRIN) 0.05 % nasal spray Place 1 spray into both nostrils 2 (two) times daily as needed (Epistaxis). 30 mL 0  . pantoprazole (PROTONIX) 40 MG tablet Take 1 tablet by mouth once daily 180 tablet 0  . potassium chloride 20 MEQ/15ML (10%) SOLN Take 15 mLs (20 mEq total) by mouth 2 (two) times daily. Please take 30 mls two times daily on first day, followed by 15 mls twice daily thereafter 473 mL 0  . sodium chloride (OCEAN) 0.65 % SOLN nasal spray Place 1 spray into both nostrils as needed for congestion. 60 mL 0  . TRESIBA FLEXTOUCH 100 UNIT/ML FlexTouch Pen INJECT 21 UNITS SUBCUTANEOUSLY IN THE EVENING 15 mL 0  . triamcinolone ointment (KENALOG) 0.5 % APPLY 1 APPLICATION TOPICALLY TO RASH TWICE DAILY FOR ITCHING (Patient taking differently: Apply 1 application topically 2 (two) times daily as needed (itching). ) 15 g 1  . furosemide (LASIX) 40 MG tablet Take 1.5 tablets (60 mg total) by mouth daily for 7 days. 10.5 tablet 0   Facility-Administered Medications Prior to Visit  Medication Dose Route Frequency Provider Last Rate Last Admin  . sodium chloride 0.9 % injection 10 mL  10 mL Intracatheter PRN Truitt Merle, MD      . sodium chloride flush (NS) 0.9 % injection 10 mL  10 mL Intracatheter PRN Truitt Merle, MD   10 mL at 08/18/19 1717    Allergies  Allergen Reactions  . Aspirin Nausea And Vomiting    Review of Systems  Constitutional: Negative for activity change.  Respiratory: Negative for shortness of breath.   Cardiovascular: Negative for chest pain.  Gastrointestinal: Negative for abdominal distention.  Genitourinary: Negative for difficulty urinating.  Neurological: Negative for dizziness, weakness, light-headedness and headaches.       Objective:    Physical Exam Constitutional:      General: She is not in acute distress.    Appearance: Normal appearance.  Eyes:     Conjunctiva/sclera: Conjunctivae  normal.  Cardiovascular:     Rate and Rhythm: Normal rate and regular rhythm.     Heart sounds: Murmur heard.  Pulmonary:     Effort: Pulmonary effort is normal.     Breath sounds: Normal breath sounds.  Abdominal:     General: There is no distension.     Palpations: Abdomen is soft.  Musculoskeletal:     Right lower leg: Edema present.     Left lower leg: Edema present.  Neurological:     General: No focal deficit present.     Mental Status: She is alert and oriented to person, place, and time.  Psychiatric:        Mood and Affect: Mood normal.        Behavior: Behavior normal.     BP (!) 156/51 (BP Location: Right Arm, Patient Position: Sitting, Cuff Size: Normal)   Pulse 61   Temp 98.5 F (36.9 C) (Oral)   Ht 5\' 3"  (1.6 m)   Wt 155 lb 6.4 oz (70.5 kg)   LMP  (LMP Unknown)   SpO2 97% Comment: room air  BMI 27.53 kg/m  Wt Readings from Last 3 Encounters:  08/23/19 155 lb 6.4 oz (70.5 kg)  08/16/19 155 lb (70.3 kg)  08/07/19 164 lb 1.6 oz (74.4 kg)    Health Maintenance Due  Topic Date Due  . COVID-19 Vaccine (1) Never done  . PNA vac Low Risk Adult (1 of 2 - PCV13) 03/16/2005  . TETANUS/TDAP  02/09/2013  . MAMMOGRAM  01/13/2019  . FOOT EXAM  04/09/2019    There are no preventive care reminders to display for this patient.   Lab Results  Component Value Date   TSH 1.730 01/30/2015   Lab Results  Component Value Date   WBC 6.7 08/18/2019   HGB 10.3 (L) 08/18/2019   HCT 32.3 (L) 08/18/2019   MCV 84.3 08/18/2019   PLT 215 08/18/2019   Lab Results  Component Value Date   NA 149 (H) 08/23/2019   K 2.9 (L) 08/23/2019   CHLORIDE 109 04/04/2014   CO2 26 08/23/2019   GLUCOSE 130 (H) 08/23/2019   BUN 11 08/23/2019   CREATININE 0.99 08/23/2019   BILITOT 0.6 08/07/2019   ALKPHOS 331 (H) 08/07/2019   AST 39 08/07/2019   ALT 19 08/07/2019   PROT 5.1 (L) 08/07/2019   ALBUMIN 2.9 (L) 08/07/2019   CALCIUM 8.7 08/23/2019   ANIONGAP 11 07/21/2019   EGFR  76 (L) 04/04/2014   GFR 57.33 (L) 06/26/2019   Lab Results  Component Value Date   CHOL 133 01/30/2019   Lab Results  Component Value Date   HDL 41.30 01/30/2019   Lab Results  Component Value Date   LDLCALC 57 01/30/2019   Lab Results  Component Value Date   TRIG 176.0 (H) 01/30/2019   Lab Results  Component Value Date   CHOLHDL 3 01/30/2019   Lab Results  Component Value Date   HGBA1C 5.6 06/26/2019       Assessment & Plan:   Problem List Items Addressed This Visit      Cardiovascular and Mediastinum   Chronic diastolic CHF (congestive heart failure) ([name] - Primary   Relevant Medications   furosemide (LASIX) 40 MG tablet   Other Relevant Orders   BMP8+Anion Gap (Completed)    Other Visit Diagnoses    Acute on chronic heart failure with preserved ejection fraction (HFpEF) ([name]       Relevant Medications   furosemide (LASIX) 40 MG tablet       Meds ordered this encounter  Medications  . furosemide (LASIX) 40 MG tablet  Sig: Take 2 tablets (80 mg total) by mouth daily for 7 days.    Dispense:  14 tablet    Refill:  0     Sarahmarie Leavey D Shelsy Seng, DO

## 2019-08-25 NOTE — Assessment & Plan Note (Signed)
Patient presents for close follow-up while managing a mild HFpEF exacerbation. She has been compliant with Lasix 60 mg daily and reports good urine output. Unfortunately, weight has remained the same and lower extremity edema fairly unchanged. Her labs show stable renal function, so will increase to Lasix 80 mg daily to hopefully get her closer to dry weight by next week. She continues to have normal O2 sats and denies any shortness of breath or chest pain.  Follow-up next week for repeat weight and labs.

## 2019-08-28 ENCOUNTER — Telehealth: Payer: Self-pay | Admitting: *Deleted

## 2019-08-28 NOTE — Telephone Encounter (Signed)
I called Dr Ritta Slot office again - vm stated they are closed for vacation. Does pt needs an office visit or telehealth appt?  Please advise.  thanks

## 2019-08-28 NOTE — Telephone Encounter (Signed)
Talked to Dr Koleen Distance - stated pt has an appt on Wed, she will address issue then unless pt develops any problems.  Called pt - informed to call if she develops any problems other wise her doctor will see her on Wed. Stated ok; just she cannot chew.

## 2019-08-28 NOTE — Telephone Encounter (Signed)
Call from pt - stated she had 3-4 teeth to fall out since Sunday, she believes, stated sometimes she cannot remember. Pt had a dental referral ordered by Dr Burr Medico in Feb - stated she has not seen a dentist. Stated she is not having any bleeding.  I called Dr Ritta Slot office - left a message to call our office.

## 2019-08-29 NOTE — Progress Notes (Signed)
Internal Medicine Clinic Attending  Case discussed with Dr. Bloomfield  At the time of the visit.  We reviewed the resident's history and exam and pertinent patient test results.  I agree with the assessment, diagnosis, and plan of care documented in the resident's note.  

## 2019-08-30 ENCOUNTER — Other Ambulatory Visit: Payer: Self-pay

## 2019-08-30 ENCOUNTER — Ambulatory Visit (INDEPENDENT_AMBULATORY_CARE_PROVIDER_SITE_OTHER): Payer: Medicare Other | Admitting: Internal Medicine

## 2019-08-30 ENCOUNTER — Telehealth: Payer: Self-pay | Admitting: *Deleted

## 2019-08-30 ENCOUNTER — Encounter: Payer: Self-pay | Admitting: Internal Medicine

## 2019-08-30 VITALS — BP 137/50 | HR 53 | Temp 98.6°F | Ht 63.0 in | Wt 154.9 lb

## 2019-08-30 DIAGNOSIS — I5032 Chronic diastolic (congestive) heart failure: Secondary | ICD-10-CM

## 2019-08-30 DIAGNOSIS — I5033 Acute on chronic diastolic (congestive) heart failure: Secondary | ICD-10-CM

## 2019-08-30 DIAGNOSIS — R945 Abnormal results of liver function studies: Secondary | ICD-10-CM

## 2019-08-30 DIAGNOSIS — I739 Peripheral vascular disease, unspecified: Secondary | ICD-10-CM

## 2019-08-30 DIAGNOSIS — R7989 Other specified abnormal findings of blood chemistry: Secondary | ICD-10-CM

## 2019-08-30 MED ORDER — TORSEMIDE 20 MG PO TABS: 40.0000 mg | ORAL_TABLET | Freq: Every day | ORAL | 0 refills | Status: DC

## 2019-08-30 MED ORDER — EMPAGLIFLOZIN 10 MG PO TABS: 10.0000 mg | ORAL_TABLET | Freq: Every day | ORAL | 2 refills | Status: DC

## 2019-08-30 NOTE — Progress Notes (Signed)
Acute Office Visit  Subjective:    Patient ID: [name], female    DOB: 04/30/1940, 79 y.o.   MRN: [id number]  Chief Complaint  Patient presents with  . Follow-up  . Medication Refill    fluid pills    HPI Patient is in today for follow-up on HFpEF exacerbation. Please see problem based charting for further details.   Past Medical History:  Diagnosis Date  . Chronic anemia   . Chronic diastolic CHF (congestive heart failure) ([place]) 10/03/2013  . Chronic GI bleeding    [name] 11/29/2014  . Family history of anesthesia complication    "niece has a hard time coming out" (09/15/2012)  . Frequent nosebleeds    chronic  . Gastric AV malformation    [name] 11/29/2014  . GERD (gastroesophageal reflux disease)   . 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 History of blood transfusion "several"  . History of epistaxis   . HTN (hypertension), benign 03/02/2012  . Hyperlipidemia   . Iron deficiency anemia    chronic infusions"  . Lichen planus    Both lower extremities  . 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 Overgrown toenails 03/18/2017  . Pneumonia 1990's X 2  . Pulmonary HTN ([place]) 04/01/2017   PASP 70mHg on echo 09/2016 and 440mg by echo 2019  . Seizures ([facility]/2016  . Symptomatic anemia 11/29/2014  . Telangiectasia    Gastric   . 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    Past Surgical History:  Procedure Laterality Date  . CATARACT EXTRACTION     "I think it was just one eye"  . ESOPHAGOGASTRODUODENOSCOPY  02/26/2011   Procedure: ESOPHAGOGASTRODUODENOSCOPY (EGD);  Surgeon: [name];  [facility];  Service: Endoscopy;  Laterality: N/A;  . ESOPHAGOGASTRODUODENOSCOPY N/A 11/08/2012   Procedure: ESOPHAGOGASTRODUODENOSCOPY (EGD);  Surgeon: [name];  [facility];  Service: Endoscopy;  Laterality: N/A;  . ESOPHAGOGASTRODUODENOSCOPY N/A 10/04/2013   Procedure:  ESOPHAGOGASTRODUODENOSCOPY (EGD);  Surgeon: [name];  [facility];  Service: Endoscopy;  Laterality: N/A;  with APC on stand-by  . ESOPHAGOGASTRODUODENOSCOPY N/A 07/06/2014   Procedure: ESOPHAGOGASTRODUODENOSCOPY (EGD);  Surgeon: [name];  [facility];  Service: Endoscopy;  Laterality: N/A;  . ESOPHAGOGASTRODUODENOSCOPY N/A 09/05/2014   Procedure: ESOPHAGOGASTRODUODENOSCOPY (EGD);  Surgeon: JaLaurence SpatesMD;  [facility];  Service: Endoscopy;  Laterality: N/A;  APC on standby to control bleeding  . ESOPHAGOGASTRODUODENOSCOPY N/A 11/29/2014   Procedure: ESOPHAGOGASTRODUODENOSCOPY (EGD);  Surgeon: [name];  [facility];  Service: Endoscopy;  Laterality: N/A;  . ESOPHAGOGASTRODUODENOSCOPY N/A 09/28/2015   Procedure: ESOPHAGOGASTRODUODENOSCOPY (EGD);  Surgeon: [name];  [facility];  Service: Endoscopy;  Laterality: N/A;  . ESOPHAGOGASTRODUODENOSCOPY (EGD) WITH PROPOFOL N/A 12/04/2017   Procedure: ESOPHAGOGASTRODUODENOSCOPY (EGD) WITH PROPOFOL;  Surgeon: [name];  [place] [facility] [place];  Laterality: N/A;  . ESOPHAGOGASTRODUODENOSCOPY ENDOSCOPY  08/19/2006   with laser treatment  . HOT HEMOSTASIS N/A 07/06/2014   Procedure: HOT HEMOSTASIS (ARGON PLASMA COAGULATION/BICAP);  Surgeon: [name];  [facility];  Service: Endoscopy;  Laterality: N/A;  . HOT HEMOSTASIS N/A 09/28/2015   Procedure: HOT HEMOSTASIS (ARGON PLASMA COAGULATION/BICAP);  Surgeon: [name];  [facility];  Service: Endoscopy;  Laterality: N/A;  . HOT HEMOSTASIS N/A 12/04/2017   Procedure: HOT HEMOSTASIS (ARGON PLASMA COAGULATION/BICAP);  Surgeon: ScWilford Corner  MD;  Location: [facility];  Service: Endoscopy;  Laterality: N/A;  . NASAL HEMORRHAGE CONTROL     "for bleeding"   . SAVORY DILATION  02/26/2011   Procedure: SAVORY DILATION;  Surgeon: [name], MD;   [name];  Service: Endoscopy;  Laterality: N/A;  c-arm needed  . SUBMUCOSAL INJECTION  12/04/2017   Procedure: SUBMUCOSAL INJECTION;  Surgeon: [name], MD;  [facility];  Service: Endoscopy;;    Family History  Problem Relation Age of Onset  . Stroke Father   . Healthy Mother   . Breast cancer Other   . Diabetes Niece   . Malignant hyperthermia Neg Hx   . Seizures Neg Hx     Social History   Socioeconomic History  . Marital status: Married    Spouse name: [name]  . Number of children: 0  . Years of education: 9  . Highest education level: Not on file  Occupational History  . Occupation: Retired Systems developer: RETIRED  Tobacco Use  . Smoking status: Former Smoker    Packs/day: 1.00    Years: 20.00    Pack years: 20.00    Types: Cigarettes    Quit date: 02/10/1971    Years since quitting: 48.5  . Smokeless tobacco: Never Used  . Tobacco comment: 09/15/2012 "smoked 50-60 yr ago"  Vaping Use  . Vaping Use: Never used  Substance and Sexual Activity  . Alcohol use: No    Alcohol/week: 0.0 standard drinks  . Drug use: No  . Sexual activity: Not on file  Other Topics Concern  . Not on file  Social History Narrative   Married, lives with husband, [name].  Ambulates without assistance.     Caffeine use: none   Social Determinants of Radio broadcast assistant Strain:   . Difficulty of Paying Living Expenses:   Food Insecurity:   . Worried About Charity fundraiser in the Last Year:   . Arboriculturist in the Last Year:   Transportation Needs:   . Film/video editor (Medical):   Marland Kitchen Lack of Transportation (Non-Medical):   Physical Activity:   . Days of Exercise per Week:   . Minutes of Exercise per Session:   Stress:   . Feeling of Stress :   Social Connections:   . Frequency of Communication with Friends and Family:   . Frequency of Social Gatherings with Friends and Family:   . Attends Religious Services:   .  Active Member of Clubs or Organizations:   . Attends Archivist Meetings:   Marland Kitchen Marital Status:   Intimate Partner Violence:   . Fear of Current or Ex-Partner:   . Emotionally Abused:   Marland Kitchen Physically Abused:   . Sexually Abused:     Outpatient Medications Prior to Visit  Medication Sig Dispense Refill  . amLODipine (NORVASC) 10 MG tablet Take 1 tablet by mouth once daily 90 tablet 1  . b complex vitamins capsule Take 1 capsule by mouth daily. 100 capsule 2  . Blood Glucose Monitoring Suppl (ONETOUCH VERIO) w/Device KIT Use to check blood sugar up to 4 times a day 1 kit 1  . calcium-vitamin D (OSCAL WITH D) 500-200 MG-UNIT tablet Take 1 tablet by mouth 2 (two) times daily.    . cetaphil (CETAPHIL) lotion Apply 1 application topically as needed for dry skin. 236 mL 0  . donepezil (ARICEPT) 10 MG tablet Take 1 tablet (10 mg  total) by mouth at bedtime. 90 tablet 4  . Dulaglutide (TRULICITY) 3 ME/2.6ST SOPN Inject 3 mg into the skin once a week. 4 pen 2  . Elastic Bandages & Supports (MEDICAL COMPRESSION STOCKINGS) MISC Wear as much as possible while awake to reduce swelling 2 each 0  . FERROCITE 324 MG TABS tablet Take 1 tablet by mouth twice daily 60 tablet 0  . gabapentin (NEURONTIN) 100 MG capsule Take 1 capsule (100 mg total) by mouth 3 (three) times daily. 90 capsule 2  . glucose blood (ONETOUCH VERIO) test strip Use as instructed 4 times daily. E11.29, insulin requiring 350 each 3  . Insulin Pen Needle (B-D UF III MINI PEN NEEDLES) 31G X 5 MM MISC USE TO INJECT INSULIN 3 TIMES A DAY 100 each 0  . lactulose (CHRONULAC) 10 GM/15ML solution TAKE 15 ML BY MOUTH TWO TIMES DAILY 473 mL 2  . levETIRAcetam (KEPPRA) 500 MG tablet Take 1 tablet (500 mg total) by mouth 2 (two) times daily. 180 tablet 4  . memantine (NAMENDA) 10 MG tablet Take 1 tablet (10 mg total) by mouth 2 (two) times daily. 180 tablet 4  . ONETOUCH DELICA LANCETS FINE MISC Check blood sugar up to 4 times a day 300 each 4   . oxycodone-acetaminophen (PERCOCET) 2.5-325 MG tablet Take 1 tablet by mouth at bedtime as needed for pain. 30 tablet 0  . oxymetazoline (AFRIN) 0.05 % nasal spray Place 1 spray into both nostrils 2 (two) times daily as needed (Epistaxis). 30 mL 0  . pantoprazole (PROTONIX) 40 MG tablet Take 1 tablet by mouth once daily 180 tablet 0  . sodium chloride (OCEAN) 0.65 % SOLN nasal spray Place 1 spray into both nostrils as needed for congestion. 60 mL 0  . TRESIBA FLEXTOUCH 100 UNIT/ML FlexTouch Pen INJECT 21 UNITS SUBCUTANEOUSLY IN THE EVENING 15 mL 0  . triamcinolone ointment (KENALOG) 0.5 % APPLY 1 APPLICATION TOPICALLY TO RASH TWICE DAILY FOR ITCHING (Patient taking differently: Apply 1 application topically 2 (two) times daily as needed (itching). ) 15 g 1  . furosemide (LASIX) 40 MG tablet Take 2 tablets (80 mg total) by mouth daily for 7 days. 14 tablet 0  . potassium chloride 20 MEQ/15ML (10%) SOLN Take 15 mLs (20 mEq total) by mouth 2 (two) times daily. Please take 30 mls two times daily on first day, followed by 15 mls twice daily thereafter 473 mL 0   Facility-Administered Medications Prior to Visit  Medication Dose Route Frequency Provider Last Rate Last Admin  . sodium chloride 0.9 % injection 10 mL  10 mL Intracatheter PRN Truitt Merle, MD      . sodium chloride flush (NS) 0.9 % injection 10 mL  10 mL Intracatheter PRN Truitt Merle, MD   10 mL at 08/18/19 1717    Allergies  Allergen Reactions  . Aspirin Nausea And Vomiting    Review of Systems  Constitutional: Negative for activity change and fatigue.  Respiratory: Negative for cough and shortness of breath.   Cardiovascular: Positive for leg swelling. Negative for chest pain and palpitations.  Gastrointestinal: Negative for abdominal distention.  Neurological: Negative for light-headedness and headaches.       Objective:    Physical Exam Constitutional:      General: She is not in acute distress.    Appearance: Normal  appearance.  Cardiovascular:     Rate and Rhythm: Normal rate and regular rhythm.     Heart sounds: Murmur heard.  Pulmonary:     Effort: Pulmonary effort is normal.     Breath sounds: Normal breath sounds.  Abdominal:     General: There is no distension.     Palpations: Abdomen is soft.     Tenderness: There is no abdominal tenderness.  Musculoskeletal:     Right lower leg: Edema (2+) present.     Left lower leg: Edema (2+) present.  Neurological:     General: No focal deficit present.     Mental Status: She is alert and oriented to person, place, and time.     BP (!) 137/50 (BP Location: Left Arm, Cuff Size: Normal)   Pulse (!) 53   Temp 98.6 F (37 C) (Oral)   Ht 5\' 3"  (1.6 m)   Wt 154 lb 14.4 oz (70.3 kg)   LMP  (LMP Unknown)   SpO2 97% Comment: room air  BMI 27.44 kg/m  Wt Readings from Last 3 Encounters:  08/30/19 154 lb 14.4 oz (70.3 kg)  08/23/19 155 lb 6.4 oz (70.5 kg)  08/16/19 155 lb (70.3 kg)    Health Maintenance Due  Topic Date Due  . COVID-19 Vaccine (1) Never done  . PNA vac Low Risk Adult (1 of 2 - PCV13) 03/16/2005  . TETANUS/TDAP  02/09/2013  . MAMMOGRAM  01/13/2019  . FOOT EXAM  04/09/2019    There are no preventive care reminders to display for this patient.   Lab Results  Component Value Date   TSH 1.730 01/30/2015   Lab Results  Component Value Date   WBC 6.7 08/18/2019   HGB 10.3 (L) 08/18/2019   HCT 32.3 (L) 08/18/2019   MCV 84.3 08/18/2019   PLT 215 08/18/2019   Lab Results  Component Value Date   NA 145 (H) 08/30/2019   K 2.4 (LL) 08/30/2019   CHLORIDE 109 04/04/2014   CO2 28 08/30/2019   GLUCOSE 91 08/30/2019   BUN 11 08/30/2019   CREATININE 1.08 (H) 08/30/2019   BILITOT 0.8 08/30/2019   ALKPHOS 305 (H) 08/30/2019   AST 40 08/30/2019   ALT 17 08/30/2019   PROT 5.6 (L) 08/30/2019   ALBUMIN 3.0 (L) 08/30/2019   CALCIUM 8.3 (L) 08/30/2019   ANIONGAP 11 07/21/2019   EGFR 76 (L) 04/04/2014   GFR 57.33 (L)  06/26/2019   Lab Results  Component Value Date   CHOL 133 01/30/2019   Lab Results  Component Value Date   HDL 41.30 01/30/2019   Lab Results  Component Value Date   LDLCALC 57 01/30/2019   Lab Results  Component Value Date   TRIG 176.0 (H) 01/30/2019   Lab Results  Component Value Date   CHOLHDL 3 01/30/2019   Lab Results  Component Value Date   HGBA1C 5.6 06/26/2019       Assessment & Plan:   Problem List Items Addressed This Visit      Cardiovascular and Mediastinum   Chronic diastolic CHF (congestive heart failure) ([name] - Primary   Relevant Medications   torsemide (DEMADEX) 20 MG tablet   Other Relevant Orders   CMP14 + Anion Gap (Completed)   Ambulatory referral to Chronic Care Management Services   Acute on chronic heart failure with preserved ejection fraction (HFpEF) ([name]    Patient presents for close follow-up while managing an acute HFpEF exacerbation. We had made good progress with diuresing initially with about 15 lbs off, but have since plateaued at 155 lbs. Based on her exam and previous weights, it appears  she could still benefit from another 7-8 lbs of diuresis. She has tolerated the increased dose of Lasix 80 mg with good urine output. Renal function has remained stable.  Given the minimal improvement the last two weeks, will try transitioning to Torsemide 40 mg. Given her diabetes, will also start Jardiance which should give an additional diuretic benefit.   Updated BMP shows worsening hypokalemia. Instructed husband to have her hold torsemide for a day, increase potassium supplementation to 80 mEq twice daily, and then resume 60 mEq BID thereafter. Renal function remains stable.        Relevant Medications   torsemide (DEMADEX) 20 MG tablet   Peripheral vascular disease ([name]   Relevant Medications   torsemide (DEMADEX) 20 MG tablet     Other   Abnormal LFTs    Per chart review, patient has had progressively increasing alk phos over the last  year, as well as mildly elevated AST and ALT. Elevated GGT today confirms likely cholestatic pattern. Hep B and Hep C are negative. Iron studies consistent with recent iron transfusion.  Will proceed with RUQ ultrasound for further evaluation.       Relevant Orders   Hepatitis B Surface Antibody (Completed)   Hepatitis C antibody (Completed)   Gamma GT, GGT (81017) (Completed)   Ferritin (Completed)   Iron and IBC ([id number]) (Completed)   US Abdomen Limited RUQ       Meds ordered this encounter  Medications  . torsemide (DEMADEX) 20 MG tablet    Sig: Take 2 tablets (40 mg total) by mouth daily.    Dispense:  14 tablet    Refill:  0  . empagliflozin (JARDIANCE) 10 MG TABS tablet    Sig: Take 1 tablet (10 mg total) by mouth daily.    Dispense:  30 tablet    Refill:  2  . potassium chloride 20 MEQ/15ML (10%) SOLN    Sig: Take 30 mLs (40 mEq total) by mouth 2 (two) times daily. Take 30 mL two times daily for 2 days, followed by 20 mls BID thereafter.    Dispense:  473 mL    Refill:  0     Elsye Mccollister D Anthonymichael Munday, DO

## 2019-08-30 NOTE — Patient Instructions (Addendum)
Ms. Musick, It was nice seeing you.   For your fluid: I am changing your fluid pill to Torsemide 40 mg daily. Continue taking the potassium supplementation as well. I will call if you need to increase this. I'd also like you to start Jardiance which should also help remove fluid and is good for your diabetes.   You can continue drinking water as needed to help with your elevated sodium.   I am doing some more tests to look into your liver function abnormalities.   Take care, and I'll see you back in 1 more week!

## 2019-08-30 NOTE — Telephone Encounter (Signed)
Spoke with Santiago Glad about a Dental referral to Dr. Orene Desanctis per Dr. Burr Medico.  Patients are directed to call for their own  appointment. Santiago Glad will arrange for patients appointment and give her a call.  Patient had asked if [facility] would make referral appointment for her.  Sharol Harness 08/30/2019 3:53 PM.

## 2019-08-31 LAB — CMP14 + ANION GAP
ALT: 17 IU/L (ref 0–32)
AST: 40 IU/L (ref 0–40)
Albumin/Globulin Ratio: 1.2 (ref 1.2–2.2)
Albumin: 3 g/dL — ABNORMAL LOW (ref 3.7–4.7)
Alkaline Phosphatase: 305 IU/L — ABNORMAL HIGH (ref 48–121)
Anion Gap: 15 mmol/L (ref 10.0–18.0)
BUN/Creatinine Ratio: 10 — ABNORMAL LOW (ref 12–28)
BUN: 11 mg/dL (ref 8–27)
Bilirubin Total: 0.8 mg/dL (ref 0.0–1.2)
CO2: 28 mmol/L (ref 20–29)
Calcium: 8.3 mg/dL — ABNORMAL LOW (ref 8.7–10.3)
Chloride: 102 mmol/L (ref 96–106)
Creatinine, Ser: 1.08 mg/dL — ABNORMAL HIGH (ref 0.57–1.00)
GFR calc Af Amer: 56 mL/min/{1.73_m2} — ABNORMAL LOW (ref >59)
GFR calc non Af Amer: 49 mL/min/{1.73_m2} — ABNORMAL LOW (ref >59)
Globulin, Total: 2.6 g/dL (ref 1.5–4.5)
Glucose: 91 mg/dL (ref 65–99)
Potassium: 2.4 mmol/L — CL (ref 3.5–5.2)
Sodium: 145 mmol/L — ABNORMAL HIGH (ref 134–144)
Total Protein: 5.6 g/dL — ABNORMAL LOW (ref 6.0–8.5)

## 2019-08-31 LAB — IRON AND TIBC
Iron Saturation: 31 % (ref 15–55)
Iron: 60 ug/dL (ref 27–139)
Total Iron Binding Capacity: 191 ug/dL — ABNORMAL LOW (ref 250–450)
UIBC: 131 ug/dL (ref 118–369)

## 2019-08-31 LAB — FERRITIN: Ferritin: 435 ng/mL — ABNORMAL HIGH (ref 15–150)

## 2019-08-31 LAB — HEPATITIS B SURFACE ANTIBODY,QUALITATIVE: Hep B Surface Ab, Qual: NONREACTIVE

## 2019-08-31 LAB — GAMMA GT: GGT: 174 IU/L — ABNORMAL HIGH (ref 0–60)

## 2019-08-31 LAB — HEPATITIS C ANTIBODY: Hep C Virus Ab: <0.1 s/co ratio (ref 0.0–0.9)

## 2019-08-31 MED ORDER — POTASSIUM CHLORIDE 20 MEQ/15ML (10%) PO SOLN: 40.0000 meq | Freq: Two times a day (BID) | ORAL | 0 refills | Status: DC

## 2019-09-01 ENCOUNTER — Encounter: Payer: Self-pay | Admitting: Internal Medicine

## 2019-09-01 NOTE — Assessment & Plan Note (Signed)
Patient presents for close follow-up while managing an acute HFpEF exacerbation. We had made good progress with diuresing initially with about 15 lbs off, but have since plateaued at 155 lbs. Based on her exam and previous weights, it appears she could still benefit from another 7-8 lbs of diuresis. She has tolerated the increased dose of Lasix 80 mg with good urine output. Renal function has remained stable.  Given the minimal improvement the last two weeks, will try transitioning to Torsemide 40 mg. Given her diabetes, will also start Jardiance which should give an additional diuretic benefit.   Updated BMP shows worsening hypokalemia. Instructed husband to have her hold torsemide for a day, increase potassium supplementation to 80 mEq twice daily, and then resume 60 mEq BID thereafter. Renal function remains stable.

## 2019-09-01 NOTE — Assessment & Plan Note (Signed)
Per chart review, patient has had progressively increasing alk phos over the last year, as well as mildly elevated AST and ALT. Elevated GGT today confirms likely cholestatic pattern. Hep B and Hep C are negative. Iron studies consistent with recent iron transfusion.  Will proceed with RUQ ultrasound for further evaluation.

## 2019-09-05 ENCOUNTER — Ambulatory Visit (HOSPITAL_COMMUNITY): Payer: Medicare Other

## 2019-09-05 NOTE — Progress Notes (Signed)
Internal Medicine Clinic Attending  Case discussed with Dr. Bloomfield  At the time of the visit.  We reviewed the resident's history and exam and pertinent patient test results.  I agree with the assessment, diagnosis, and plan of care documented in the resident's note.  

## 2019-09-06 ENCOUNTER — Other Ambulatory Visit: Payer: Self-pay

## 2019-09-06 ENCOUNTER — Ambulatory Visit: Payer: Medicare Other | Admitting: *Deleted

## 2019-09-06 ENCOUNTER — Ambulatory Visit (INDEPENDENT_AMBULATORY_CARE_PROVIDER_SITE_OTHER): Payer: Medicare Other | Admitting: Internal Medicine

## 2019-09-06 ENCOUNTER — Encounter: Payer: Self-pay | Admitting: Internal Medicine

## 2019-09-06 VITALS — BP 142/56 | HR 57 | Temp 98.7°F | Ht 63.0 in | Wt 146.5 lb

## 2019-09-06 DIAGNOSIS — E876 Hypokalemia: Secondary | ICD-10-CM | POA: Diagnosis not present

## 2019-09-06 DIAGNOSIS — I5032 Chronic diastolic (congestive) heart failure: Secondary | ICD-10-CM | POA: Diagnosis not present

## 2019-09-06 DIAGNOSIS — E1165 Type 2 diabetes mellitus with hyperglycemia: Secondary | ICD-10-CM

## 2019-09-06 DIAGNOSIS — N182 Chronic kidney disease, stage 2 (mild): Secondary | ICD-10-CM

## 2019-09-06 LAB — BASIC METABOLIC PANEL
Anion gap: 11 (ref 5–15)
BUN: 7 mg/dL — ABNORMAL LOW (ref 8–23)
CO2: 34 mmol/L — ABNORMAL HIGH (ref 22–32)
Calcium: 8.6 mg/dL — ABNORMAL LOW (ref 8.9–10.3)
Chloride: 98 mmol/L (ref 98–111)
Creatinine, Ser: 1.1 mg/dL — ABNORMAL HIGH (ref 0.44–1.00)
GFR calc Af Amer: 55 mL/min — ABNORMAL LOW (ref >60)
GFR calc non Af Amer: 48 mL/min — ABNORMAL LOW (ref >60)
Glucose, Bld: 138 mg/dL — ABNORMAL HIGH (ref 70–99)
Potassium: 2.9 mmol/L — ABNORMAL LOW (ref 3.5–5.1)
Sodium: 143 mmol/L (ref 135–145)

## 2019-09-06 MED ORDER — TORSEMIDE 20 MG PO TABS: 20.0000 mg | ORAL_TABLET | Freq: Every day | ORAL | 1 refills | Status: DC

## 2019-09-06 MED ORDER — POTASSIUM CHLORIDE 20 MEQ/15ML (10%) PO SOLN: 40.0000 meq | Freq: Two times a day (BID) | ORAL | 0 refills | Status: DC

## 2019-09-06 NOTE — Addendum Note (Signed)
Addended by: Barrington Ellison on: 09/06/2019 05:55 PM   Modules accepted: Orders

## 2019-09-06 NOTE — Progress Notes (Signed)
Acute Office Visit  Subjective:    Patient ID: [name], female    DOB: Sep 21, 1940, 79 y.o.   MRN: [id number]  Chief Complaint  Patient presents with  . Follow-up  . Medication Refill    HPI Patient is in today for follow-up on HFpEF. Please see problem based charting for further details.   Past Medical History:  Diagnosis Date  . Chronic anemia   . Chronic diastolic CHF (congestive heart failure) ([place]) 10/03/2013  . Chronic GI bleeding    [name] 11/29/2014  . Family history of anesthesia complication    "niece has a hard time coming out" (09/15/2012)  . Frequent nosebleeds    chronic  . Gastric AV malformation    [name] 11/29/2014  . GERD (gastroesophageal reflux disease)   . 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 History of blood transfusion "several"  . History of epistaxis   . HTN (hypertension), benign 03/02/2012  . Hyperlipidemia   . Iron deficiency anemia    chronic infusions"  . Lichen planus    Both lower extremities  . 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 Overgrown toenails 03/18/2017  . Pneumonia 1990's X 2  . Pulmonary HTN ([place]) 04/01/2017   PASP 61mHg on echo 09/2016 and 473mg by echo 2019  . [place]izures [place]  . Symptomatic anemia 11/29/2014  . Telangiectasia    Gastric   . 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    Past Surgical History:  Procedure Laterality Date  . CATARACT EXTRACTION     "I think it was just one eye"  . ESOPHAGOGASTRODUODENOSCOPY  02/26/2011   Procedure: ESOPHAGOGASTRODUODENOSCOPY (EGD);  Surgeon: [name];  [facility];  Service: Endoscopy;  Laterality: N/A;  . ESOPHAGOGASTRODUODENOSCOPY N/A 11/08/2012   Procedure: ESOPHAGOGASTRODUODENOSCOPY (EGD);  Surgeon: [name];  [facility];  Service: Endoscopy;  Laterality: N/A;  . ESOPHAGOGASTRODUODENOSCOPY N/A 10/04/2013   Procedure: ESOPHAGOGASTRODUODENOSCOPY (EGD);  Surgeon: [name];  [facility];  Service: Endoscopy;  Laterality: N/A;  with APC on stand-by  . ESOPHAGOGASTRODUODENOSCOPY N/A 07/06/2014   Procedure: ESOPHAGOGASTRODUODENOSCOPY (EGD);  Surgeon: [name];  [facility];  Service: Endoscopy;  Laterality: N/A;  . ESOPHAGOGASTRODUODENOSCOPY N/A 09/05/2014   Procedure: ESOPHAGOGASTRODUODENOSCOPY (EGD);  Surgeon: JaLaurence SpatesMD;  [facility];  Service: Endoscopy;  Laterality: N/A;  APC on standby to control bleeding  . ESOPHAGOGASTRODUODENOSCOPY N/A 11/29/2014   Procedure: ESOPHAGOGASTRODUODENOSCOPY (EGD);  Surgeon: [name];  [facility];  Service: Endoscopy;  Laterality: N/A;  . ESOPHAGOGASTRODUODENOSCOPY N/A 09/28/2015   Procedure: ESOPHAGOGASTRODUODENOSCOPY (EGD);  Surgeon: [name];  [facility];  Service: Endoscopy;  Laterality: N/A;  . ESOPHAGOGASTRODUODENOSCOPY (EGD) WITH PROPOFOL N/A 12/04/2017   Procedure: ESOPHAGOGASTRODUODENOSCOPY (EGD) WITH PROPOFOL;  Surgeon: [name];  [facility]: [facility];  Laterality: N/A;  . ESOPHAGOGASTRODUODENOSCOPY ENDOSCOPY  08/19/2006   with laser treatment  . HOT HEMOSTASIS N/A 07/06/2014   Procedure: HOT HEMOSTASIS (ARGON PLASMA COAGULATION/BICAP);  Surgeon: [name];  [facility];  Service: Endoscopy;  Laterality: N/A;  . HOT HEMOSTASIS N/A 09/28/2015   Procedure: HOT HEMOSTASIS (ARGON PLASMA COAGULATION/BICAP);  Surgeon: [name];  [facility];  Service: Endoscopy;  Laterality: N/A;  . HOT HEMOSTASIS N/A 12/04/2017   Procedure: HOT HEMOSTASIS (ARGON PLASMA COAGULATION/BICAP);  Surgeon: [name];  [facility]  Service: Endoscopy;  Laterality: N/A;  . NASAL HEMORRHAGE CONTROL     "for bleeding"   . SAVORY DILATION  02/26/2011   Procedure: SAVORY DILATION;  Surgeon: [name], MD;  [name];  Service: Endoscopy;  Laterality:  N/A;  c-arm needed  . SUBMUCOSAL INJECTION  12/04/2017   Procedure: SUBMUCOSAL INJECTION;  Surgeon: [name], MD;  [facility];  Service: Endoscopy;;    Family History  Problem Relation Age of [place]  . Stroke Father   . Healthy Mother   . Breast cancer Other   . Diabetes Niece   . Malignant hyperthermia Neg Hx   . Seizures Neg Hx     Social History   Socioeconomic History  . Marital status: Married    Spouse name: [name]  . Number of children: 0  . Years of education: 9  . Highest education level: Not on file  Occupational History  . Occupation: Retired Systems developer: RETIRED  Tobacco Use  . Smoking status: Former Smoker    Packs/day: 1.00    Years: 20.00    Pack years: 20.00    Types: Cigarettes    Quit date: 02/10/1971    Years since quitting: 48.6  . Smokeless tobacco: Never Used  . Tobacco comment: 09/15/2012 "smoked 50-60 yr ago"  Vaping Use  . Vaping Use: Never used  Substance and Sexual Activity  . Alcohol use: No    Alcohol/week: 0.0 standard drinks  . Drug use: No  . Sexual activity: Not on file  Other Topics Concern  . Not on file  Social History Narrative   Married, lives with husband, [name].  Ambulates without assistance.     Caffeine use: none   Social Determinants of Health   Financial Resource Strain: Low Risk   . Difficulty of Paying Living Expenses: Not very hard  Food Insecurity: No Food Insecurity  . Worried About Charity fundraiser in the Last Year: Never true  . Ran Out of Food in the Last Year: Never true  Transportation Needs: No Transportation Needs  . Lack of Transportation (Medical): No  . Lack of Transportation (Non-Medical): No  Physical Activity: Inactive  . Days of Exercise per Week: 0 days  . Minutes of Exercise per Session: 0 min  Stress:   . Feeling of Stress :   Social Connections:   . Frequency of Communication with Friends and Family:   . Frequency of Social Gatherings with Friends and  Family:   . Attends Religious Services:   . Active Member of Clubs or Organizations:   . Attends Archivist Meetings:   Marland Kitchen Marital Status:   Intimate Partner Violence: Not At Risk  . Fear of Current or Ex-Partner: No  . Emotionally Abused: No  . Physically Abused: No  . Sexually Abused: No    Outpatient Medications Prior to Visit  Medication Sig Dispense Refill  . amLODipine (NORVASC) 10 MG tablet Take 1 tablet by mouth once daily 90 tablet 1  . b complex vitamins capsule Take 1 capsule by mouth daily. 100 capsule 2  . Blood Glucose Monitoring Suppl (ONETOUCH VERIO) w/Device KIT Use to check blood sugar up to 4 times a day 1 kit 1  . calcium-vitamin D (OSCAL WITH D) 500-200 MG-UNIT tablet Take 1 tablet by mouth 2 (two) times daily.    . cetaphil (CETAPHIL) lotion Apply 1 application topically as needed for dry skin. 236 mL 0  . donepezil (ARICEPT)  10 MG tablet Take 1 tablet (10 mg total) by mouth at bedtime. 90 tablet 4  . Dulaglutide (TRULICITY) 3 WY/6.1UO SOPN Inject 3 mg into the skin once a week. 4 pen 2  . Elastic Bandages & Supports (MEDICAL COMPRESSION STOCKINGS) MISC Wear as much as possible while awake to reduce swelling 2 each 0  . empagliflozin (JARDIANCE) 10 MG TABS tablet Take 1 tablet (10 mg total) by mouth daily. 30 tablet 2  . FERROCITE 324 MG TABS tablet Take 1 tablet by mouth twice daily 60 tablet 0  . gabapentin (NEURONTIN) 100 MG capsule Take 1 capsule (100 mg total) by mouth 3 (three) times daily. 90 capsule 2  . glucose blood (ONETOUCH VERIO) test strip Use as instructed 4 times daily. E11.29, insulin requiring 350 each 3  . Insulin Pen Needle (B-D UF III MINI PEN NEEDLES) 31G X 5 MM MISC USE TO INJECT INSULIN 3 TIMES A DAY 100 each 0  . lactulose (CHRONULAC) 10 GM/15ML solution TAKE 15 ML BY MOUTH TWO TIMES DAILY 473 mL 2  . levETIRAcetam (KEPPRA) 500 MG tablet Take 1 tablet (500 mg total) by mouth 2 (two) times daily. 180 tablet 4  . memantine (NAMENDA)  10 MG tablet Take 1 tablet (10 mg total) by mouth 2 (two) times daily. 180 tablet 4  . ONETOUCH DELICA LANCETS FINE MISC Check blood sugar up to 4 times a day 300 each 4  . oxycodone-acetaminophen (PERCOCET) 2.5-325 MG tablet Take 1 tablet by mouth at bedtime as needed for pain. 30 tablet 0  . oxymetazoline (AFRIN) 0.05 % nasal spray Place 1 spray into both nostrils 2 (two) times daily as needed (Epistaxis). 30 mL 0  . pantoprazole (PROTONIX) 40 MG tablet Take 1 tablet by mouth once daily 180 tablet 0  . sodium chloride (OCEAN) 0.65 % SOLN nasal spray Place 1 spray into both nostrils as needed for congestion. 60 mL 0  . TRESIBA FLEXTOUCH 100 UNIT/ML FlexTouch Pen INJECT 21 UNITS SUBCUTANEOUSLY IN THE EVENING 15 mL 0  . triamcinolone ointment (KENALOG) 0.5 % APPLY 1 APPLICATION TOPICALLY TO RASH TWICE DAILY FOR ITCHING (Patient taking differently: Apply 1 application topically 2 (two) times daily as needed (itching). ) 15 g 1  . potassium chloride 20 MEQ/15ML (10%) SOLN Take 30 mLs (40 mEq total) by mouth 2 (two) times daily. Take 30 mL two times daily for 2 days, followed by 20 mls BID thereafter. 473 mL 0  . torsemide (DEMADEX) 20 MG tablet Take 2 tablets (40 mg total) by mouth daily. 14 tablet 0   Facility-Administered Medications Prior to Visit  Medication Dose Route Frequency Provider Last Rate Last Admin  . sodium chloride 0.9 % injection 10 mL  10 mL Intracatheter PRN Truitt Merle, MD      . sodium chloride flush (NS) 0.9 % injection 10 mL  10 mL Intracatheter PRN Truitt Merle, MD   10 mL at 08/18/19 1717    Allergies  Allergen Reactions  . Aspirin Nausea And Vomiting    Review of Systems  Respiratory: Negative for cough, shortness of breath and wheezing.   Cardiovascular: Negative for chest pain and palpitations.  Gastrointestinal: Negative for abdominal distention.  Neurological: Negative for light-headedness and headaches.       Objective:    Physical Exam Constitutional:       General: She is not in acute distress.    Appearance: Normal appearance.  Cardiovascular:     Rate and Rhythm: Normal rate and  regular rhythm.     Heart sounds: Murmur heard.  Systolic murmur is present with a grade of 3/6.      Comments: Bilateral pitting edema improved from previous exams. 1-2+ to mid-shin.  Pulmonary:     Effort: Pulmonary effort is normal.     Breath sounds: Normal breath sounds.  Abdominal:     General: There is no distension.     Palpations: Abdomen is soft.     Tenderness: There is no abdominal tenderness.  Neurological:     Mental Status: She is alert.     BP (!) 142/56 (BP Location: Left Arm, Patient Position: Sitting, Cuff Size: Normal)   Pulse 57   Temp 98.7 F (37.1 C) (Oral)   Ht 5' 3" (1.6 m)   Wt 146 lb 8 oz (66.5 kg)   LMP  (LMP Unknown)   SpO2 96% Comment: room air  BMI 25.95 kg/m  Wt Readings from Last 3 Encounters:  09/06/19 146 lb 8 oz (66.5 kg)  08/30/19 154 lb 14.4 oz (70.3 kg)  08/23/19 155 lb 6.4 oz (70.5 kg)    Health Maintenance Due  Topic Date Due  . COVID-19 Vaccine (1) Never done  . PNA vac Low Risk Adult (1 of 2 - PCV13) 03/16/2005  . TETANUS/TDAP  02/09/2013  . MAMMOGRAM  01/13/2019  . FOOT EXAM  04/09/2019  . INFLUENZA VACCINE  09/10/2019    There are no preventive care reminders to display for this patient.   Lab Results  Component Value Date   TSH 1.730 01/30/2015   Lab Results  Component Value Date   WBC 6.7 08/18/2019   HGB 10.3 (L) 08/18/2019   HCT 32.3 (L) 08/18/2019   MCV 84.3 08/18/2019   PLT 215 08/18/2019   Lab Results  Component Value Date   NA 143 09/06/2019   K 2.9 (L) 09/06/2019   CHLORIDE 109 04/04/2014   CO2 34 (H) 09/06/2019   GLUCOSE 138 (H) 09/06/2019   BUN 7 (L) 09/06/2019   CREATININE 1.10 (H) 09/06/2019   BILITOT 0.8 08/30/2019   ALKPHOS 305 (H) 08/30/2019   AST 40 08/30/2019   ALT 17 08/30/2019   PROT 5.6 (L) 08/30/2019   ALBUMIN 3.0 (L) 08/30/2019   CALCIUM 8.6 (L)  09/06/2019   ANIONGAP 11 09/06/2019   EGFR 76 (L) 04/04/2014   GFR 57.33 (L) 06/26/2019   Lab Results  Component Value Date   CHOL 133 01/30/2019   Lab Results  Component Value Date   HDL 41.30 01/30/2019   Lab Results  Component Value Date   LDLCALC 57 01/30/2019   Lab Results  Component Value Date   TRIG 176.0 (H) 01/30/2019   Lab Results  Component Value Date   CHOLHDL 3 01/30/2019   Lab Results  Component Value Date   HGBA1C 5.6 06/26/2019       Assessment & Plan:   Problem List Items Addressed This Visit      Cardiovascular and Mediastinum   Chronic diastolic CHF (congestive heart failure) ([place])    Since changing diuretic and adding Jardiance, patient has lost 8 lbs from last visit and edema on exam is improved.  Based on chart review, we are likely at her dry weight.  Will continue maintenance dose of Torsemide 20 mg daily.  Her BMP shows persistent hypokalemia in the setting of diuresis. Gave instruction to take 40 mEq BID for 2 days, followed by 40 mEq daily thereafter. Will have her return in 2 weeks  for repeat BMP and to ensure volume status remains stable.  Will also obtain repeat echo to evaluate for any change in EF or worsening pulmonary HTN.       Relevant Medications   torsemide (DEMADEX) 20 MG tablet   Other Relevant Orders   ECHOCARDIOGRAM COMPLETE    Other Visit Diagnoses    Hypokalemia    -  Primary   Relevant Orders   BMP w Anion Gap (STAT/[facility]-performed on-site) (Completed)       Meds ordered this encounter  Medications  . torsemide (DEMADEX) 20 MG tablet    Sig: Take 1 tablet (20 mg total) by mouth daily.    Dispense:  30 tablet    Refill:  1  . potassium chloride 20 MEQ/15ML (10%) SOLN    Sig: Take 30 mLs (40 mEq total) by mouth 2 (two) times daily. Take 30 mL two times daily for 2 days, followed by 20 mls BID thereafter.    Dispense:  3800 mL    Refill:  0      D , DO

## 2019-09-06 NOTE — Progress Notes (Signed)
Internal Medicine Clinic Resident  I have personally reviewed this encounter including the documentation in this note and/or discussed this patient with the care management provider. I will address any urgent items identified by the care management provider and will communicate my actions to the patient's PCP. I have reviewed the patient's CCM visit with my supervising attending, Dr Evette Doffing.  Harvie Heck, MD  [facility] PGY-2 09/06/2019

## 2019-09-06 NOTE — Patient Instructions (Signed)
Visit Information It was very nice meeting you today. I will plan to meet with you again at your next clinic visit. Goals Addressed              This Visit's Progress     Patient Stated   .  "Do wives of veterans have benefits for home care assistance?" (pt-stated)        CARE PLAN ENTRY (see longtitudinal plan of care for additional care plan information)  Current Barriers:  . 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 Work with the care management team to address educational, disease management, and care coordination needs  . Begin or continue self health monitoring activities as directed today Measure and record cbg (blood glucose) 2-3 times daily, Measure and record blood pressure 1-2 times per week, and Measure and record weight daily . 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 Call care management team with questions or concerns . 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 Evaluation of  current treatment plans and patient's adherence to plan as established by provider . Assessed patient understanding of disease states . Assessed patient's education and care coordination needs . Provided disease specific education to patient - reviewed action plan for heart failure and reviewed sx and treatment of hypoglycemia,provided patient with [facility] Care Management spiral bound calendar with action plans and educational information for HTN, Afib, DM and HF, placed CCM RN and CCM BSW business cards in care holder at front of calendar . Discussed level of care concerns . 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 Patient is unable to independently self-manage chronic health conditions  Initial goal documentation       Other   .  Blood Pressure < 140/90        BP Readings from Last 3 Encounters:  09/06/19 (!) 142/56  08/30/19 (!) 137/50  08/23/19 (!) 156/51       .  HEMOGLOBIN A1C < 7.5        Lab Results  Component Value Date   HGBA1C 5.6 06/26/2019   HGBA1C 6.1 (A) 05/16/2019   HGBA1C 6.9 (A) 01/30/2019   Lab Results  Component Value Date   MICROALBUR 1,289.4 (H) 03/07/2019   LDLCALC 57 01/30/2019   CREATININE 1.10 (H) 09/06/2019          Ms. Starkel was given  information about Chronic Care Management services today including:  1. CCM service includes personalized support from designated clinical staff supervised by her physician, including individualized plan of care and coordination with other care providers 2. 24/7 contact phone numbers for assistance for urgent and routine care needs. 3. Service will only be billed when office clinical staff spend 20 minutes or more in a month  to coordinate care. 4. Only one practitioner may furnish and bill the service in a calendar month. 5. The patient may stop CCM services at any time (effective at the end of the month) by phone call to the office staff. 6. The patient will be responsible for cost sharing (co-pay) of up to 20% of the service fee (after annual deductible is met).  Patient agreed to services and verbal consent obtained.   The patient verbalized understanding of instructions provided today and declined a print copy of patient instruction materials.   The care management team will reach out to the patient again over the next 30 days.   Kelli Churn RN, CCM, [facility] RN Care Manager [phone]

## 2019-09-06 NOTE — Patient Instructions (Addendum)
Ms. Smeal, It was nice seeing you again!  Your weight is looking much better.   I'd like you to decrease the Torsemide to 20 mg daily. Please follow the prescribed instruction for the potassium supplementation (new script sent).   I'd also like to get a repeat ultrasound of your heart. They will call to schedule this.   I'll have you come back in 2 weeks to see how things are doing.   Take care, Dr. Koleen Distance

## 2019-09-06 NOTE — Addendum Note (Signed)
Addended by: Barrington Ellison on: 09/06/2019 05:44 PM   Modules accepted: Orders

## 2019-09-06 NOTE — Chronic Care Management (AMB) (Addendum)
Chronic Care Management   Initial Visit Note  09/06/2019 Name: [name] MRN: [id number] DOB: March 26, 1940  Referred by: Delice Bison, DO Reason for referral : Chronic Care Management (HF, DM, PAF, CKD, HTN)   [name] is a 79 y.o. year old female who is a primary care patient of Modena Nunnery D, DO. 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    SDOH (Social Determinants of Health) assessments performed: Yes See Care Plan activities for detailed interventions related to SDOH  SDOH Interventions     Most Recent Value  SDOH Interventions  Food Insecurity Interventions Intervention Not Indicated  Financial Strain Interventions Intervention Not Indicated  Housing Interventions Intervention Not Indicated  Intimate Partner Violence Interventions Intervention Not Indicated  Transportation Interventions Intervention Not Indicated       Medications: Outpatient Encounter Medications as of 09/06/2019  Medication Sig Note  . amLODipine (NORVASC) 10 MG tablet Take 1 tablet by mouth once daily   . b complex vitamins capsule Take 1 capsule by mouth daily.   . Blood Glucose Monitoring Suppl (ONETOUCH VERIO) w/Device KIT Use to check blood sugar up to 4 times a day   . calcium-vitamin D (OSCAL WITH D) 500-200 MG-UNIT tablet Take 1 tablet by mouth 2 (two) times daily.   . cetaphil (CETAPHIL) lotion Apply 1 application topically as needed for dry skin.   Marland Kitchen donepezil (ARICEPT) 10 MG tablet Take 1 tablet (10 mg total)  by mouth at bedtime.   . Dulaglutide (TRULICITY) 3 WE/3.1VQ SOPN Inject 3 mg into the skin once a week.   Water engineer Bandages & Supports (MEDICAL COMPRESSION STOCKINGS) MISC Wear as much as possible while awake to reduce swelling   . empagliflozin (JARDIANCE) 10 MG TABS tablet Take 1 tablet (10 mg total) by mouth daily.   Marland Kitchen FERROCITE 324 MG TABS tablet Take 1 tablet by mouth twice daily   . gabapentin (NEURONTIN) 100 MG capsule Take 1 capsule (100 mg total) by mouth 3 (three) times daily.   Marland Kitchen glucose blood (ONETOUCH VERIO) test strip Use as instructed 4 times daily. E11.29, insulin requiring   . Insulin Pen Needle (B-D UF III MINI PEN NEEDLES) 31G X 5 MM MISC USE TO INJECT INSULIN 3 TIMES A DAY   . lactulose (CHRONULAC) 10 GM/15ML solution TAKE 15 ML BY MOUTH TWO TIMES DAILY   . levETIRAcetam (KEPPRA) 500 MG tablet Take 1 tablet (500 mg total) by mouth 2 (two) times daily.   . memantine (NAMENDA) 10 MG tablet Take 1 tablet (10 mg total) by mouth 2 (two) times daily.   Glory Rosebush DELICA LANCETS FINE MISC Check blood sugar up to 4 times a day   . oxycodone-acetaminophen (PERCOCET) 2.5-325 MG tablet Take 1 tablet by mouth at bedtime as needed for pain.   Marland Kitchen oxymetazoline (AFRIN) 0.05 % nasal spray Place 1 spray into both nostrils 2 (two) times daily as needed (Epistaxis).   . pantoprazole (PROTONIX) 40 MG tablet Take 1 tablet by mouth once daily   . potassium chloride 20 MEQ/15ML (10%) SOLN Take 30 mLs (40 mEq total)  by mouth 2 (two) times daily. Take 30 mL two times daily for 2 days, followed by 20 mls BID thereafter.   . sodium chloride (OCEAN) 0.65 % SOLN nasal spray Place 1 spray into both nostrils as needed for congestion.   . torsemide (DEMADEX) 20 MG tablet Take 1 tablet (20 mg total) by mouth daily.   . TRESIBA FLEXTOUCH 100 UNIT/ML FlexTouch Pen INJECT 21 UNITS SUBCUTANEOUSLY IN THE EVENING   . triamcinolone ointment (KENALOG) 0.5 % APPLY 1 APPLICATION TOPICALLY TO RASH TWICE DAILY FOR ITCHING  (Patient taking differently: Apply 1 application topically 2 (two) times daily as needed (itching). )   . [DISCONTINUED] potassium chloride 20 MEQ/15ML (10%) SOLN Take 30 mLs (40 mEq total) by mouth 2 (two) times daily. Take 30 mL two times daily for 2 days, followed by 20 mls BID thereafter.   . [DISCONTINUED] torsemide (DEMADEX) 20 MG tablet Take 2 tablets (40 mg total) by mouth daily.   . [DISCONTINUED] TRESIBA FLEXTOUCH 100 UNIT/ML SOPN FlexTouch Pen INJECT 21 UNITS SUBCUTANEOUSLY IN THE EVENING   . [DISCONTINUED] TRULICITY 1.5 LP/3.7TK SOPN INJECT 1.5 MG INTO THE SKIN ONCE A WEEK. EVERY TUESDAY (Patient taking differently: Inject 1.5 mg as directed every Tuesday. ) 10/18/2018: LF 09/13/18   Facility-Administered Encounter Medications as of 09/06/2019  Medication  . sodium chloride 0.9 % injection 10 mL  . sodium chloride flush (NS) 0.9 % injection 10 mL     Objective:  Wt Readings from Last 3 Encounters:  09/06/19 146 lb 8 oz (66.5 kg)  08/30/19 154 lb 14.4 oz (70.3 kg)  08/23/19 155 lb 6.4 oz (70.5 kg)    Goals Addressed              This Visit's Progress     Patient Stated   .  "Do wives of veterans have benefits for home care assistance?" (pt-stated)        [place] (see longtitudinal plan of care for additional care plan information)  Current Barriers:  . Chronic Disease Management support, education, and care coordination needs related to Atrial Fibrillation, CHF, HLD, DMII, CKD Stage 3, and 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- met with patient and husband during clinic visit, state they rent their 1 story home and have not considered assisted living or rest home care but would be receptive to discussing any home care options, husband states he administers medications to patient and does not use medication box, patient does not drive and husband provides all transportation, state they have no children. Both patient and husband states they  are not interested in receiving the Covid 19 vaccination.   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 Work with the care management team to address educational, disease management, and care coordination needs  . Begin or continue self health monitoring activities as directed today Measure and record cbg (blood glucose) 2-3 times daily, Measure and record blood pressure 1-2 times per week, and Measure and record weight daily . 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 Call care management team with questions or concerns . 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  Evaluation of current treatment plans and patient's adherence to plan as established by provider . Assessed patient understanding of disease states . Assessed patient's education and care coordination needs Provided disease specific education to patient - reviewed action plan for heart failure and reviewed sx and treatment of hypoglycemia, provided patient with [facility] spiral bound calendar with action plans and educational information for HTN, Afib, DM and HF, placed CCM RN and CCM BSW business cards in care holder at front of calendar . Discussed level of care concerns . 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 Patient is unable to independently self-manage chronic health conditions  Initial goal documentation       Other   .  Blood Pressure < 140/90        BP Readings from Last 3 Encounters:  09/06/19 (!) 142/56  08/30/19 (!) 137/50  08/23/19 (!) 156/51       .  HEMOGLOBIN A1C < 7.5        Lab Results  Component Value Date   HGBA1C 5.6 06/26/2019   HGBA1C 6.1 (A) 05/16/2019   HGBA1C 6.9 (A) 01/30/2019   Lab Results  Component Value Date   MICROALBUR 1,289.4 (H) 03/07/2019   LDLCALC 57 01/30/2019   CREATININE 1.10 (H) 09/06/2019           Ms. Soth was given information about Chronic Care Management services today including:  1. CCM service includes personalized support from designated clinical staff supervised by her physician, including individualized plan of care and coordination with other care providers 2. 24/7 contact phone numbers for assistance for urgent and routine care needs. 3. Service will only be billed when office clinical staff spend 20 minutes or more in a month to coordinate care. 4. Only one practitioner may furnish and bill the service in a calendar month. 5. The patient may stop CCM services at any time (effective at the end of the month) by phone call to the office staff. 6. The patient will be responsible for cost sharing (co-pay) of up to 20% of the service fee (after annual deductible is met).  Patient agreed to services and verbal consent obtained.   Plan:   The care management team will reach out to the patient again over the next 30 days.   Kelli Churn RN, CCM, [facility] RN Care Manager [phone]

## 2019-09-07 NOTE — Progress Notes (Signed)
Internal Medicine Clinic Attending  CCM services provided by the care management provider and their documentation were discussed with Dr. Marva Panda. We reviewed the pertinent findings, urgent action items addressed by the resident and non-urgent items to be addressed by the PCP.  I agree with the assessment, diagnosis, and plan of care documented in the CCM and resident's note.  Axel Filler, MD 09/07/2019

## 2019-09-14 ENCOUNTER — Encounter: Payer: Self-pay | Admitting: Internal Medicine

## 2019-09-14 ENCOUNTER — Other Ambulatory Visit: Payer: Self-pay

## 2019-09-14 ENCOUNTER — Ambulatory Visit (HOSPITAL_COMMUNITY)
Admission: RE | Admit: 2019-09-14 | Discharge: 2019-09-14 | Disposition: A | Discharge status: Home / Self Care | Payer: Medicare Other | Source: Ambulatory Visit | Attending: Internal Medicine | Admitting: Internal Medicine

## 2019-09-14 DIAGNOSIS — I110 Hypertensive heart disease with heart failure: Secondary | ICD-10-CM | POA: Diagnosis not present

## 2019-09-14 DIAGNOSIS — E119 Type 2 diabetes mellitus without complications: Secondary | ICD-10-CM | POA: Diagnosis not present

## 2019-09-14 DIAGNOSIS — I5032 Chronic diastolic (congestive) heart failure: Secondary | ICD-10-CM | POA: Insufficient documentation

## 2019-09-14 DIAGNOSIS — I4891 Unspecified atrial fibrillation: Secondary | ICD-10-CM | POA: Insufficient documentation

## 2019-09-14 DIAGNOSIS — I350 Nonrheumatic aortic (valve) stenosis: Secondary | ICD-10-CM | POA: Insufficient documentation

## 2019-09-14 DIAGNOSIS — I272 Pulmonary hypertension, unspecified: Secondary | ICD-10-CM | POA: Insufficient documentation

## 2019-09-14 NOTE — Assessment & Plan Note (Signed)
Since changing diuretic and adding Jardiance, patient has lost 8 lbs from last visit and edema on exam is improved.  Based on chart review, we are likely at her dry weight.  Will continue maintenance dose of Torsemide 20 mg daily.  Her BMP shows persistent hypokalemia in the setting of diuresis. Gave instruction to take 40 mEq BID for 2 days, followed by 40 mEq daily thereafter. Will have her return in 2 weeks for repeat BMP and to ensure volume status remains stable.  Will also obtain repeat echo to evaluate for any change in EF or worsening pulmonary HTN.

## 2019-09-14 NOTE — Progress Notes (Signed)
  Echocardiogram 2D Echocardiogram has been performed.  [name] 09/14/2019, 4:54 PM

## 2019-09-15 ENCOUNTER — Other Ambulatory Visit: Payer: Self-pay

## 2019-09-15 ENCOUNTER — Inpatient Hospital Stay: Payer: Medicare Other | Attending: Hematology

## 2019-09-15 ENCOUNTER — Inpatient Hospital Stay: Payer: Medicare Other

## 2019-09-15 ENCOUNTER — Telehealth: Payer: Self-pay

## 2019-09-15 VITALS — BP 171/58 | HR 55 | Temp 98.2°F | Resp 17

## 2019-09-15 DIAGNOSIS — I780 Hereditary hemorrhagic telangiectasia: Secondary | ICD-10-CM

## 2019-09-15 DIAGNOSIS — D500 Iron deficiency anemia secondary to blood loss (chronic): Secondary | ICD-10-CM

## 2019-09-15 DIAGNOSIS — K31811 Angiodysplasia of stomach and duodenum with bleeding: Secondary | ICD-10-CM

## 2019-09-15 DIAGNOSIS — K31819 Angiodysplasia of stomach and duodenum without bleeding: Secondary | ICD-10-CM

## 2019-09-15 DIAGNOSIS — D509 Iron deficiency anemia, unspecified: Secondary | ICD-10-CM

## 2019-09-15 DIAGNOSIS — Z95828 Presence of other vascular implants and grafts: Secondary | ICD-10-CM

## 2019-09-15 DIAGNOSIS — E876 Hypokalemia: Secondary | ICD-10-CM

## 2019-09-15 DIAGNOSIS — D508 Other iron deficiency anemias: Secondary | ICD-10-CM | POA: Diagnosis not present

## 2019-09-15 LAB — CMP (CANCER CENTER ONLY)
ALT: 16 U/L (ref 0–44)
AST: 30 U/L (ref 15–41)
Albumin: 2.6 g/dL — ABNORMAL LOW (ref 3.5–5.0)
Alkaline Phosphatase: 271 U/L — ABNORMAL HIGH (ref 38–126)
Anion gap: 10 (ref 5–15)
BUN: 13 mg/dL (ref 8–23)
CO2: 34 mmol/L — ABNORMAL HIGH (ref 22–32)
Calcium: 9.5 mg/dL (ref 8.9–10.3)
Chloride: 101 mmol/L (ref 98–111)
Creatinine: 1.19 mg/dL — ABNORMAL HIGH (ref 0.44–1.00)
GFR, Est AFR Am: 50 mL/min — ABNORMAL LOW (ref >60)
GFR, Estimated: 43 mL/min — ABNORMAL LOW (ref >60)
Glucose, Bld: 128 mg/dL — ABNORMAL HIGH (ref 70–99)
Potassium: 2.7 mmol/L — CL (ref 3.5–5.1)
Sodium: 145 mmol/L (ref 135–145)
Total Bilirubin: 0.7 mg/dL (ref 0.3–1.2)
Total Protein: 6.6 g/dL (ref 6.5–8.1)

## 2019-09-15 LAB — CBC WITH DIFFERENTIAL (CANCER CENTER ONLY)
Abs Immature Granulocytes: 0.03 10*3/uL (ref 0.00–0.07)
Basophils Absolute: 0.1 10*3/uL (ref 0.0–0.1)
Basophils Relative: 1 %
Eosinophils Absolute: 0.1 10*3/uL (ref 0.0–0.5)
Eosinophils Relative: 1 %
HCT: 41.5 % (ref 36.0–46.0)
Hemoglobin: 13.5 g/dL (ref 12.0–15.0)
Immature Granulocytes: 0 %
Lymphocytes Relative: 10 %
Lymphs Abs: 0.7 10*3/uL (ref 0.7–4.0)
MCH: 27.2 pg (ref 26.0–34.0)
MCHC: 32.5 g/dL (ref 30.0–36.0)
MCV: 83.5 fL (ref 80.0–100.0)
Monocytes Absolute: 0.7 10*3/uL (ref 0.1–1.0)
Monocytes Relative: 9 %
Neutro Abs: 5.7 10*3/uL (ref 1.7–7.7)
Neutrophils Relative %: 79 %
Platelet Count: 181 10*3/uL (ref 150–400)
RBC: 4.97 MIL/uL (ref 3.87–5.11)
RDW: 14.9 % (ref 11.5–15.5)
WBC Count: 7.2 10*3/uL (ref 4.0–10.5)
nRBC: 0 % (ref 0.0–0.2)

## 2019-09-15 LAB — FERRITIN: Ferritin: 213 ng/mL (ref 11–307)

## 2019-09-15 MED ORDER — SODIUM CHLORIDE 0.9 % IV SOLN
INTRAVENOUS | Status: DC
  Filled 2019-09-15: qty 250

## 2019-09-15 MED ORDER — HEPARIN SOD (PORK) LOCK FLUSH 100 UNIT/ML IV SOLN
500.0000 [IU] | Freq: Once | INTRAVENOUS | Status: DC | PRN
  Filled 2019-09-15: qty 5

## 2019-09-15 MED ORDER — POTASSIUM CHLORIDE 10 MEQ/100ML IV SOLN
INTRAVENOUS | Status: AC
  Filled 2019-09-15: qty 200

## 2019-09-15 MED ORDER — HEPARIN SOD (PORK) LOCK FLUSH 100 UNIT/ML IV SOLN
500.0000 [IU] | Freq: Once | INTRAVENOUS | Status: AC | PRN
  Administered 2019-09-15: 500 [IU]
  Filled 2019-09-15: qty 5

## 2019-09-15 MED ORDER — POTASSIUM CHLORIDE 10 MEQ/100ML IV SOLN
10.0000 meq | INTRAVENOUS | Status: AC
  Administered 2019-09-15 (×2): 10 meq via INTRAVENOUS

## 2019-09-15 MED ORDER — SODIUM CHLORIDE 0.9% FLUSH
10.0000 mL | INTRAVENOUS | Status: DC | PRN
  Administered 2019-09-15: 10 mL
  Filled 2019-09-15: qty 10

## 2019-09-15 MED ORDER — SODIUM CHLORIDE 0.9 % IV SOLN: 510.0000 mg | Freq: Once | INTRAVENOUS | Status: DC

## 2019-09-15 NOTE — Patient Instructions (Signed)
Hypokalemia Hypokalemia means that the amount of potassium in the blood is lower than normal. Potassium is a chemical (electrolyte) that helps regulate the amount of fluid in the body. It also stimulates muscle tightening (contraction) and helps nerves work properly. Normally, most of the body's potassium is inside cells, and only a very small amount is in the blood. Because the amount in the blood is so small, minor changes to potassium levels in the blood can be life-threatening. What are the causes? This condition may be caused by:  Antibiotic medicine.  Diarrhea or vomiting. Taking too much of a medicine that helps you have a bowel movement (laxative) can cause diarrhea and lead to hypokalemia.  Chronic kidney disease (CKD).  Medicines that help the body get rid of excess fluid (diuretics).  Eating disorders, such as bulimia.  Low magnesium levels in the body.  Sweating a lot. What are the signs or symptoms? Symptoms of this condition include:  Weakness.  Constipation.  Fatigue.  Muscle cramps.  Mental confusion.  Skipped heartbeats or irregular heartbeat (palpitations).  Tingling or numbness. How is this diagnosed? This condition is diagnosed with a blood test. How is this treated? This condition may be treated by:  Taking potassium supplements by mouth.  Adjusting the medicines that you take.  Eating more foods that contain a lot of potassium. If your potassium level is very low, you may need to get potassium through an IV and be monitored in the hospital. Follow these instructions at home:   Take over-the-counter and prescription medicines only as told by your health care provider. This includes vitamins and supplements.  Eat a healthy diet. A healthy diet includes fresh fruits and vegetables, whole grains, healthy fats, and lean proteins.  If instructed, eat more foods that contain a lot of potassium. This includes: ? Nuts, such as peanuts and  pistachios. ? Seeds, such as sunflower seeds and pumpkin seeds. ? Peas, lentils, and lima beans. ? Whole grain and bran cereals and breads. ? Fresh fruits and vegetables, such as apricots, avocado, bananas, cantaloupe, kiwi, oranges, tomatoes, asparagus, and potatoes. ? Orange juice. ? Tomato juice. ? Red meats. ? Yogurt.  Keep all follow-up visits as told by your health care provider. This is important. Contact a health care provider if you:  Have weakness that gets worse.  Feel your heart pounding or racing.  Vomit.  Have diarrhea.  Have diabetes (diabetes mellitus) and you have trouble keeping your blood sugar (glucose) in your target range. Get help right away if you:  Have chest pain.  Have shortness of breath.  Have vomiting or diarrhea that lasts for more than 2 days.  Faint. Summary  Hypokalemia means that the amount of potassium in the blood is lower than normal.  This condition is diagnosed with a blood test.  Hypokalemia may be treated by taking potassium supplements, adjusting the medicines that you take, or eating more foods that are high in potassium.  If your potassium level is very low, you may need to get potassium through an IV and be monitored in the hospital. This information is not intended to replace advice given to you by your health care provider. Make sure you discuss any questions you have with your health care provider. Document Revised: 09/08/2017 Document Reviewed: 09/08/2017 Elsevier Patient Education  2020 [employer]  

## 2019-09-15 NOTE — Telephone Encounter (Signed)
Critical Value: K 2.7 Dr Burr Medico notified Patient to receive K 10 meq IV x2 today in infusion. Dr. Burr Medico wants [name] to increase her oral potassium suppliment by 20 meq daily. I spoke with Mr [name] who manages her medication.  He stats she iscurrently taking 30 meq daily.  I instructed him to increase by 20 meq daily until we recheck K level next month.  He verbalized understanding.

## 2019-09-15 NOTE — Progress Notes (Signed)
Patient will not receive Feraheme today since Ferritin is greater than 50, per perameters. Confirmed this with dr. Burr Medico as well.

## 2019-09-15 NOTE — Patient Instructions (Signed)

## 2019-09-18 NOTE — Progress Notes (Signed)
Internal Medicine Clinic Attending  Case discussed with Dr. Bloomfield  At the time of the visit.  We reviewed the resident's history and exam and pertinent patient test results.  I agree with the assessment, diagnosis, and plan of care documented in the resident's note.  

## 2019-09-19 ENCOUNTER — Other Ambulatory Visit: Payer: Self-pay | Admitting: Endocrinology

## 2019-09-19 DIAGNOSIS — Z794 Long term (current) use of insulin: Secondary | ICD-10-CM

## 2019-09-20 ENCOUNTER — Ambulatory Visit (HOSPITAL_COMMUNITY)
Admission: EM | Admit: 2019-09-20 | Discharge: 2019-09-20 | Disposition: A | Discharge status: Home / Self Care | Payer: Medicare Other | Attending: Family Medicine | Admitting: Family Medicine

## 2019-09-20 ENCOUNTER — Telehealth: Payer: Self-pay | Admitting: *Deleted

## 2019-09-20 ENCOUNTER — Encounter (HOSPITAL_COMMUNITY): Payer: Self-pay | Admitting: Emergency Medicine

## 2019-09-20 ENCOUNTER — Encounter: Payer: Medicare Other | Admitting: Internal Medicine

## 2019-09-20 ENCOUNTER — Other Ambulatory Visit: Payer: Self-pay

## 2019-09-20 ENCOUNTER — Telehealth: Payer: Medicare Other

## 2019-09-20 ENCOUNTER — Other Ambulatory Visit: Payer: Medicare Other

## 2019-09-20 DIAGNOSIS — R569 Unspecified convulsions: Secondary | ICD-10-CM | POA: Insufficient documentation

## 2019-09-20 LAB — CBC
HCT: 39.1 % (ref 36.0–46.0)
Hemoglobin: 12.6 g/dL (ref 12.0–15.0)
MCH: 26.6 pg (ref 26.0–34.0)
MCHC: 32.2 g/dL (ref 30.0–36.0)
MCV: 82.7 fL (ref 80.0–100.0)
Platelets: 208 10*3/uL (ref 150–400)
RBC: 4.73 MIL/uL (ref 3.87–5.11)
RDW: 14.9 % (ref 11.5–15.5)
WBC: 7.5 10*3/uL (ref 4.0–10.5)
nRBC: 0 % (ref 0.0–0.2)

## 2019-09-20 LAB — BASIC METABOLIC PANEL
Anion gap: 11 (ref 5–15)
BUN: 16 mg/dL (ref 8–23)
CO2: 32 mmol/L (ref 22–32)
Calcium: 9 mg/dL (ref 8.9–10.3)
Chloride: 98 mmol/L (ref 98–111)
Creatinine, Ser: 1.17 mg/dL — ABNORMAL HIGH (ref 0.44–1.00)
GFR calc Af Amer: 51 mL/min — ABNORMAL LOW (ref >60)
GFR calc non Af Amer: 44 mL/min — ABNORMAL LOW (ref >60)
Glucose, Bld: 77 mg/dL (ref 70–99)
Potassium: 2.7 mmol/L — CL (ref 3.5–5.1)
Sodium: 141 mmol/L (ref 135–145)

## 2019-09-20 LAB — ECHOCARDIOGRAM COMPLETE
AR max vel: 1.52 cm2
AV Area VTI: 1.72 cm2
AV Area mean vel: 1.61 cm2
AV Mean grad: 10.5 mmHg
AV Peak grad: 25 mmHg
Ao pk vel: 2.5 m/s
Area-P 1/2: 2.39 cm2
S' Lateral: 2.2 cm

## 2019-09-20 LAB — CBG MONITORING, ED: Glucose-Capillary: 83 mg/dL (ref 70–99)

## 2019-09-20 MED ORDER — CLONIDINE HCL 0.1 MG PO TABS: 0.1000 mg | ORAL_TABLET | Freq: Once | ORAL | Status: DC

## 2019-09-20 NOTE — ED Notes (Signed)
MD at bedside. 

## 2019-09-20 NOTE — ED Notes (Signed)
Dr. Sabra Heck made aware of patient's presence and chief complaint

## 2019-09-20 NOTE — ED Triage Notes (Signed)
Pt presents to [facility] for assessment after having a seizure approx 1 hour ago.  States EMS came to scene, told her her sugar was dangerously low, and gave her some orange juice.  Patient states she was not told to follow up anywhere.

## 2019-09-20 NOTE — ED Notes (Signed)
Patient able to ambulate independently with cane

## 2019-09-20 NOTE — Telephone Encounter (Signed)
Pt did not come to her appt this afternoon. Called pt - no answer; left message to call the office to re-schedule.

## 2019-09-20 NOTE — ED Provider Notes (Signed)
[place]    CSN: [id number] Arrival date & time: 09/20/19  1815      History   Chief Complaint Chief Complaint  Patient presents with  . Seizures    HPI [name] is a 79 y.o. female.   Patient presents for evaluation after having had a seizure's afternoon.  EMS was called apparently they checked her blood sugar and gave her some orange juice told her to come here for evaluation.  She denies any problems at present.  She says that she takes her anticonvulsant regularly.  She also has a history of diabetes congestive heart failure hypertension hyperlipidemia.  She lives with her husband who helps take care of her.  Seizures are generally well controlled.  She tells me her last one was over 1 year ago Reviewing her medicines patient is on Aricept and Namenda suggesting that she has a 6 degree of dementia.  This makes me wonder if she remembers to take her medicines as prescribed including anticonvulsant. HPI  Past Medical History:  Diagnosis Date  . Chronic anemia   . Chronic diastolic CHF (congestive heart failure) ([place]) 10/03/2013  . Chronic GI bleeding    [name] 11/29/2014  . Family history of anesthesia complication    "niece has a hard time coming out" (09/15/2012)  . Frequent nosebleeds    chronic  . Gastric AV malformation    [name] 11/29/2014  . GERD (gastroesophageal reflux disease)   . 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 History of blood transfusion "several"  . History of epistaxis   . HTN (hypertension), benign 03/02/2012  . Hyperlipidemia   . Iron deficiency anemia    chronic infusions"  . Lichen planus    Both lower extremities  . 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 Overgrown toenails 03/18/2017  . Pneumonia 1990's X 2  . Pulmonary HTN ([place]) 04/01/2017   PASP 40mHg on echo 09/2016 and 432mg by echo 2019  . Seizures (HCWeaubleau8/2016  . Symptomatic anemia 11/29/2014  . Telangiectasia    Gastric   .  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    Patient Active Problem List   Diagnosis Date Noted  . Abnormal LFTs 08/09/2019  . Hypervolemia due to congestive heart failure (HCGreilickville06/23/2021  . Neuropathy 07/27/2019  . Hyperkalemia 10/18/2018  . Port-A-Cath in place 05/23/2018  . Iron deficiency anemia due to chronic blood loss 04/19/2018  . Acute right hip pain 03/10/2018  . Hyperpigmented skin lesion 02/07/2018  . Leg weakness, bilateral 12/23/2017  . Hematemesis 12/04/2017  . Pain localized to upper abdomen 10/05/2017  . Lower extremity pain, bilateral 07/15/2017  . Pulmonary HTN (HCAnthony02/21/2019  . Heart murmur 04/01/2017  . Osteopenia after menopause 01/13/2017  . Epistaxis 07/30/2016  . Venous insufficiency of both lower extremities 04/27/2016  . Anemia due to chronic blood loss   . Dry skin 02/17/2016  . Peripheral vascular disease (HCTrenton11/20/2017  . Pain in joint of right shoulder 10/22/2015  . PAF (paroxysmal atrial fibrillation) (HCRobertsville08/20/2017  . Lichen planus 0602/[id number]. Healthcare maintenance 06/20/2015  . Seborrheic keratosis 06/20/2015  . Memory loss 01/30/2015  . Simple febrile convulsions (HCSkyline Acres09/12/2014  . Hyperammonemia (HCOceola08/24/2016  . CKD (chronic kidney disease), stage III (HCBuffalo  . Chronic diastolic CHF (congestive heart failure) (HCWestervelt08/25/2015  . Acute on chronic heart failure with preserved ejection fraction (HFpEF) (HCSan Leandro08/25/2015  .  DM (diabetes mellitus), type 2, uncontrolled, with renal complications ([place]) 35/36/1443  . HTN (hypertension), benign 03/02/2012  . Fatigue 07/10/2011  . 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 CATARACT EXTRACTION     "I think it was just one eye"  . ESOPHAGOGASTRODUODENOSCOPY  02/26/2011   Procedure: ESOPHAGOGASTRODUODENOSCOPY (EGD);  Surgeon: [name], MD;  Location[name];   Service: Endoscopy;  Laterality: N/A;  . ESOPHAGOGASTRODUODENOSCOPY N/A 11/08/2012   Procedure: ESOPHAGOGASTRODUODENOSCOPY (EGD);  Surgeon: [name], MD;  Location: [name];  Service: Endoscopy;  Laterality: N/A;  . ESOPHAGOGASTRODUODENOSCOPY N/A 10/04/2013   Procedure: ESOPHAGOGASTRODUODENOSCOPY (EGD);  Surgeon: Winfield Cunas., MD;  Location: [name];  Service: Endoscopy;  Laterality: N/A;  with APC on stand-by  . ESOPHAGOGASTRODUODENOSCOPY N/A 07/06/2014   Procedure: ESOPHAGOGASTRODUODENOSCOPY (EGD);  Surgeon: Clarene Essex, MD;  Location: [name];  Service: Endoscopy;  Laterality: N/A;  . ESOPHAGOGASTRODUODENOSCOPY N/A 09/05/2014   Procedure: ESOPHAGOGASTRODUODENOSCOPY (EGD);  Surgeon: Laurence Spates, MD;  Location: [name];  Service: Endoscopy;  Laterality: N/A;  APC on standby to control bleeding  . ESOPHAGOGASTRODUODENOSCOPY N/A 11/29/2014   Procedure: ESOPHAGOGASTRODUODENOSCOPY (EGD);  Surgeon: [name], MD;  [facility];  Service: Endoscopy;  Laterality: N/A;  . ESOPHAGOGASTRODUODENOSCOPY N/A 09/28/2015   Procedure: ESOPHAGOGASTRODUODENOSCOPY (EGD);  Surgeon: Clarene Essex, MD;  Location[facility] ENDOSCOPY;  Service: Endoscopy;  Laterality: N/A;  . ESOPHAGOGASTRODUODENOSCOPY (EGD) WITH PROPOFOL N/A 12/04/2017   Procedure: ESOPHAGOGASTRODUODENOSCOPY (EGD) WITH PROPOFOL;  Surgeon: [name], MD;  Location: [facility];  Service: Endoscopy;  Laterality: N/A;  . ESOPHAGOGASTRODUODENOSCOPY ENDOSCOPY  08/19/2006   with laser treatment  . HOT HEMOSTASIS N/A 07/06/2014   Procedure: HOT HEMOSTASIS (ARGON PLASMA COAGULATION/BICAP);  Surgeon: Clarene Essex, MD;  Location: [name];  Service: Endoscopy;  Laterality: N/A;  . HOT HEMOSTASIS N/A 09/28/2015   Procedure: HOT HEMOSTASIS (ARGON PLASMA COAGULATION/BICAP);  Surgeon: Clarene Essex, MD;  Location: [facility] ENDOSCOPY;  Service: Endoscopy;  Laterality: N/A;  . HOT HEMOSTASIS N/A 12/04/2017   Procedure: HOT  HEMOSTASIS (ARGON PLASMA COAGULATION/BICAP);  Surgeon: [name], MD;  Location: [facility];  Service: Endoscopy;  Laterality: N/A;  . NASAL HEMORRHAGE CONTROL     "for bleeding"   . SAVORY DILATION  02/26/2011   Procedure: SAVORY DILATION;  Surgeon: [name], MD;  Location: [name];  Service: Endoscopy;  Laterality: N/A;  c-arm needed  . SUBMUCOSAL INJECTION  12/04/2017   Procedure: SUBMUCOSAL INJECTION;  Surgeon: [name], MD;  Location: [facility] ENDOSCOPY;  Service: Endoscopy;;    OB History   No obstetric history on file.      Home Medications    Prior to Admission medications   Medication Sig Start Date End Date Taking? Authorizing Provider  amLODipine (NORVASC) 10 MG tablet Take 1 tablet by mouth once daily 07/12/19   [place]omfield, Nila Nephew D, DO  b complex vitamins capsule Take 1 capsule by mouth daily. 10/26/16   Alphonzo Grieve, MD  Blood Glucose Monitoring Suppl (ONETOUCH VERIO) w/Device KIT Use to check blood sugar up to 4 times a day 10/04/18   [place], Nila Nephew D, DO  calcium-vitamin D (OSCAL WITH D) 500-200 MG-UNIT tablet Take 1 tablet by mouth 2 (two) times daily.    [provider]  cetaphil (CETAPHIL) lotion Apply 1 application topically as needed for dry skin. 02/18/16   Collier Salina, MD  donepezil (ARICEPT) 10 MG tablet Take 1 tablet (10  mg total) by mouth at bedtime. 11/14/18   Lomax, Amy, NP  Dulaglutide (TRULICITY) 3 WG/9.5AO SOPN Inject 3 mg into the skin once a week. 05/16/19   Elayne Snare, MD  Elastic Bandages & Supports ([place]) MISC Wear as much as possible while awake to reduce swelling 04/27/16   Minus Liberty, MD  empagliflozin (JARDIANCE) 10 MG TABS tablet Take 1 tablet (10 mg total) by mouth daily. 08/30/19   Bloomfield, Carley D, DO  FERROCITE 324 MG TABS tablet Take 1 tablet by mouth twice daily 08/15/19   Bloomfield, Carley D, DO  gabapentin (NEURONTIN) 100 MG capsule Take 1 capsule (100 mg total) by  mouth 3 (three) times daily. 08/16/19   [place]omfield, Carley D, DO  glucose blood (ONETOUCH VERIO) test strip Use as instructed 4 times daily. E11.29, insulin requiring 03/27/19   Bloomfield, Carley D, DO  Insulin Pen Needle (B-D UF III MINI PEN NEEDLES) 31G X 5 MM MISC USE TO INJECT INSULIN 3 TIMES A DAY 06/12/19   Elayne Snare, MD  lactulose (CHRONULAC) 10 GM/15ML solution TAKE 15 ML BY MOUTH TWO TIMES DAILY 11/18/17   Bloomfield, Carley D, DO  levETIRAcetam (KEPPRA) 500 MG tablet Take 1 tablet (500 mg total) by mouth 2 (two) times daily. 11/14/18   Lomax, Amy, NP  memantine (NAMENDA) 10 MG tablet Take 1 tablet (10 mg total) by mouth 2 (two) times daily. 11/14/18   Lomax, Amy, NP  ONETOUCH DELICA LANCETS FINE MISC Check blood sugar up to 4 times a day 09/20/17   Bartholomew Crews, MD  oxycodone-acetaminophen (PERCOCET) 2.5-325 MG tablet Take 1 tablet by mouth at bedtime as needed for pain. 06/22/19   Bloomfield, Carley D, DO  oxymetazoline (AFRIN) 0.05 % nasal spray Place 1 spray into both nostrils 2 (two) times daily as needed (Epistaxis). 10/05/13   Robbie Lis, MD  pantoprazole (PROTONIX) 40 MG tablet Take 1 tablet by mouth once daily 02/28/19   Bloomfield, Carley D, DO  potassium chloride 20 MEQ/15ML (10%) SOLN Take 30 mLs (40 mEq total) by mouth 2 (two) times daily. Take 30 mL two times daily for 2 days, followed by 20 mls BID thereafter. 09/06/19   Bloomfield, Carley D, DO  sodium chloride (OCEAN) 0.65 % SOLN nasal spray Place 1 spray into both nostrils as needed for congestion. 08/22/18   Chase Picket, MD  torsemide (DEMADEX) 20 MG tablet Take 1 tablet (20 mg total) by mouth daily. 09/06/19   Bloomfield, Carley D, DO  TRESIBA FLEXTOUCH 100 UNIT/ML FlexTouch Pen INJECT 21 UNITS SUBCUTANEOUSLY IN THE EVENING 09/20/19   Elayne Snare, MD  triamcinolone ointment (KENALOG) 0.5 % APPLY 1 APPLICATION TOPICALLY TO RASH TWICE DAILY FOR ITCHING Patient taking differently: Apply 1 application topically 2 (two)  times daily as needed (itching).  02/07/18   Alphonzo Grieve, MD  TRESIBA FLEXTOUCH 100 UNIT/ML SOPN FlexTouch Pen INJECT 21 UNITS SUBCUTANEOUSLY IN THE EVENING 01/09/19   Oda Kilts, MD  TRULICITY 1.5 ZH/0.8MV SOPN INJECT 1.5 MG INTO THE SKIN ONCE A WEEK. EVERY TUESDAY Patient taking differently: Inject 1.5 mg as directed every Tuesday.  09/13/18   Modena Nunnery D, DO    Family History Family History  Problem Relation Age of Onset  . Stroke Father   . Healthy Mother   . Breast cancer Other   . Diabetes Niece   . Malignant hyperthermia Neg Hx   . Seizures Neg Hx     Social History Social History  Tobacco Use  . Smoking status: Former Smoker    Packs/day: 1.00    Years: 20.00    Pack years: 20.00    Types: Cigarettes    Quit date: 02/10/1971    Years since quitting: 48.6  . Smokeless tobacco: Never Used  . Tobacco comment: 09/15/2012 "smoked 50-60 yr ago"  Vaping Use  . Vaping Use: Never used  Substance Use Topics  . Alcohol use: No    Alcohol/week: 0.0 standard drinks  . Drug use: No     Allergies   Aspirin   Review of Systems Review of Systems  Constitutional: Negative for activity change, appetite change, fever and unexpected weight change.  HENT: Negative.   Respiratory: Negative.   Cardiovascular: Negative.   Gastrointestinal: Negative.   Genitourinary: Negative.   Neurological: Positive for seizures.  Psychiatric/Behavioral: Negative.   All other systems reviewed and are negative.    Physical Exam Triage Vital Signs ED Triage Vitals  Enc Vitals Group     BP 09/20/19 1824 (!) 154/113     Pulse Rate 09/20/19 1824 68     Resp 09/20/19 1824 18     Temp 09/20/19 1824 98.9 F (37.2 C)     Temp Source 09/20/19 1824 Oral     SpO2 09/20/19 1824 96 %     Weight --      Height --      Head Circumference --      Peak Flow --      Pain Score 09/20/19 1825 0     Pain Loc --      Pain Edu? --      Excl. in [place]? --    No data found.  Updated  Vital Signs BP (!) 154/113 (BP Location: Right Arm)   Pulse 68   Temp 98.9 F (37.2 C) (Oral)   Resp 18   LMP  (LMP Unknown)   SpO2 96%   Visual Acuity Right Eye Distance:   Left Eye Distance:   Bilateral Distance:    Right Eye Near:   Left Eye Near:    Bilateral Near:     Physical Exam Vitals and nursing note reviewed.  Constitutional:      Appearance: Normal appearance.  HENT:     Head: Normocephalic.  Eyes:     Pupils: Pupils are equal, round, and reactive to light.  Cardiovascular:     Rate and Rhythm: Normal rate and regular rhythm.     Heart sounds: Murmur heard.   Pulmonary:     Effort: Pulmonary effort is normal.  Abdominal:     General: Abdomen is flat.     Palpations: Abdomen is soft.  Musculoskeletal:     Cervical back: Normal range of motion.     Comments: Ambulates with cane  Neurological:     General: No focal deficit present.     Mental Status: She is alert and oriented to person, place, and time.      UC Treatments / Results  Labs (all labs ordered are listed, but only abnormal results are displayed) Labs Reviewed - No data to display  EKG   Radiology No results found.  Procedures Procedures (including critical care time)  Medications Ordered in UC Medications - No data to display  Initial Impression / Assessment and Plan / UC Course  I have reviewed the triage vital signs and the nursing notes.  Pertinent labs & imaging results that were available during my care of the patient were reviewed by  me and considered in my medical decision making (see chart for details).     Seizure of unknown certain etiology.  Will call if blood work is abnormal.  CBC is normal the as is BUN.  Please try to remember to take all medicines Final Clinical Impressions(s) / UC Diagnoses   Final diagnoses:  None   Discharge Instructions   None    ED Prescriptions    None     PDMP not reviewed this encounter.   Wardell Honour, MD 09/20/19  2035

## 2019-09-21 ENCOUNTER — Other Ambulatory Visit: Payer: Self-pay

## 2019-09-21 ENCOUNTER — Telehealth (HOSPITAL_COMMUNITY): Payer: Self-pay | Admitting: Emergency Medicine

## 2019-09-21 ENCOUNTER — Telehealth: Payer: Self-pay | Admitting: *Deleted

## 2019-09-21 ENCOUNTER — Encounter: Payer: Self-pay | Admitting: Internal Medicine

## 2019-09-21 ENCOUNTER — Ambulatory Visit (INDEPENDENT_AMBULATORY_CARE_PROVIDER_SITE_OTHER): Payer: Medicare Other | Admitting: Internal Medicine

## 2019-09-21 VITALS — BP 120/47 | HR 65 | Temp 98.3°F

## 2019-09-21 DIAGNOSIS — G40909 Epilepsy, unspecified, not intractable, without status epilepticus: Secondary | ICD-10-CM | POA: Diagnosis not present

## 2019-09-21 DIAGNOSIS — I5032 Chronic diastolic (congestive) heart failure: Secondary | ICD-10-CM

## 2019-09-21 DIAGNOSIS — R413 Other amnesia: Secondary | ICD-10-CM

## 2019-09-21 DIAGNOSIS — R569 Unspecified convulsions: Secondary | ICD-10-CM

## 2019-09-21 LAB — BASIC METABOLIC PANEL
Anion gap: 12 (ref 5–15)
BUN: 21 mg/dL (ref 8–23)
CO2: 28 mmol/L (ref 22–32)
Calcium: 9 mg/dL (ref 8.9–10.3)
Chloride: 101 mmol/L (ref 98–111)
Creatinine, Ser: 1.13 mg/dL — ABNORMAL HIGH (ref 0.44–1.00)
GFR calc Af Amer: 54 mL/min — ABNORMAL LOW (ref >60)
GFR calc non Af Amer: 46 mL/min — ABNORMAL LOW (ref >60)
Glucose, Bld: 153 mg/dL — ABNORMAL HIGH (ref 70–99)
Potassium: 3.2 mmol/L — ABNORMAL LOW (ref 3.5–5.1)
Sodium: 141 mmol/L (ref 135–145)

## 2019-09-21 NOTE — Telephone Encounter (Signed)
Received critical lab voicemail this morning.  Upon review of chart, patient's potassium critically low at 2.7.  Dr. Meda Coffee made aware who advised me to contact the patient and have her go straight to the ER.  Attempted to call both numbers on chart, husband answered and states she is unavailable.  This RN asked him to have her call me back as soon as possible, and worth interrupting her activities to call.  Husband verbalized understanding.

## 2019-09-21 NOTE — Patient Instructions (Signed)
Thank you for trusting me with your care. To recap, today we discussed the following:  1. Chronic diastolic CHF (congestive heart failure) ([name]  - BMP8+Anion Gap  2. DM (diabetes mellitus), type 2, uncontrolled, with renal complications ([place])  - we are going to arrange someone to review your medications at your home

## 2019-09-21 NOTE — Telephone Encounter (Signed)
Pt states she needs to see [facility] doctor, was in ED 8/11 with seizure, follow up with pcp group. appt 8/12 at 1515, [employer] will pick up appr [phone]. BLUE TEAM

## 2019-09-21 NOTE — Telephone Encounter (Signed)
Received call from [facility] who states they cannot locate the patient.  Attempted to call patient's daughter, who did not answer, unable to leave voicemail.  Attempted all three numbers on patient's chart, attempted to call patient's husband.  No answer.  Left voicemail.

## 2019-09-21 NOTE — Telephone Encounter (Signed)
Patient made aware of critically low potassium and need to go to the emergency room.  Patient states she has no way to get there.  This RN offered [facility] non-emergency, patient states she cannot figure out how to call them.  This RN asked her permission, and then called for non-emergency traffic pickup and transport for patient.

## 2019-09-21 NOTE — Progress Notes (Signed)
° °  CC: Reports recent seizures   HPI:[name] is a 79 y.o. female who presents for evaluation of recent seizures. Please see individual problem based A/P for details.  Past Medical History:  Diagnosis Date   Chronic anemia    Chronic diastolic CHF (congestive heart failure) ([place]) 10/03/2013   Chronic GI bleeding    /notes 11/29/2014   Family history of anesthesia complication    "niece has a hard time coming out" (09/15/2012)   Frequent nosebleeds    chronic   Gastric AV malformation    /notes 11/29/2014   GERD (gastroesophageal reflux disease)    Heart murmur 04/01/2017   Moderate AVSC on echo 09/2016   History of blood transfusion "several"   History of epistaxis    HTN (hypertension), benign 03/02/2012   Hyperlipidemia    Iron deficiency anemia    chronic infusions"   Lichen planus    Both lower extremities   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   Review of Systems:  ROS negative except as mentioned in individual problem based A/P.    Physical Exam: Vitals:   09/21/19 1619  BP: (!) 120/47  Pulse: 65  Temp: 98.3 F (36.8 C)  TempSrc: Oral  SpO2: 98%   Wt Readings from Last 3 Encounters:  09/06/19 146 lb 8 oz (66.5 kg)  08/30/19 154 lb 14.4 oz (70.3 kg)  08/23/19 155 lb 6.4 oz (70.5 kg)    General: NAD, very pleasant , laughing  Cardiovascular: Normal rate, regular rhythm. 3/6 aortic systolic murmurs, no jvd Lungs: CTAB, no wheezes or rales Ext: 1+ LE edema Neuro: Alert and oriented x 4, unable to perform serial 7's or spell world backwards Psychiatric/Behavioral:  normal mood, normal behavior    Assessment & Plan:   See  Encounters Tab for problem based charting.  Patient discussed with Dr. Daryll Drown

## 2019-09-21 NOTE — Telephone Encounter (Signed)
[name], please look at notes of recent time. Thurs(today) when pt and spouse were in clinic trace[place] and I had concerns, spouse is starting to decline and they may be struggling but dont want to lose independence. Dr Court Joy agreed that [facility] to help manage meds for at least 1 month would be good but it occurred to me, would they qualify for PACE? Transportation is becoming hard for them also. I will back Monday.

## 2019-09-22 ENCOUNTER — Other Ambulatory Visit: Payer: Medicare Other

## 2019-09-24 NOTE — Assessment & Plan Note (Signed)
Patient is followed by neurology for seizure disorder and dementia. Seizures have been well controlled on Keppra, but on review of her medications she has not been taking her second dose daily. She presented to the ED with a seizure recently. On my exam she doesn't appear to be postictal but reports seizure earlier in the day. I am concerned her husband who has managed her medications may not be able to anymore. I have attempted to reach daughter listed in chart, but no one answered. Patients last appointment with neurology was almost a year ago with plan for 1 year follow up. Patient dementia was thought to be stable and she had an improved score on her [place] at last neurolgoy visit.  Plan: Home health assessment - safety of home and review of medications Referral for follow-up with neurology for seizure disorder and evaluation of dementia Continue Aricept and Namenda

## 2019-09-24 NOTE — Assessment & Plan Note (Addendum)
Patient was seen on 8/5 with follow up today. She has 1+ lower extremity edema. Dry weight has been estimated to be around 147, same weight today. Last echo 8/5 , hyperdynamic 75% EF with grade 1 diastolic function. Patient and husband believe she is taking 20 mg Torsemide daily. As mentioned, patient has diagnosis of dementia. I am concerned patient's husband is no longer able to manage her medications so we will ask HH to evaluate patients living conditions and medications. Lab work today showed potassium 3.2 , improved from last visit. Unsure if patient could follow further instructions to double dose again, but feel she is safe with current supplementation and reduced torsemide dosing Plan: Continue Torsemide 20 mg daily Continue Potassium solution , 20 mls BID

## 2019-09-25 ENCOUNTER — Other Ambulatory Visit: Payer: Self-pay

## 2019-09-25 ENCOUNTER — Ambulatory Visit: Payer: Medicare Other | Admitting: Endocrinology

## 2019-09-25 ENCOUNTER — Ambulatory Visit: Payer: Medicare Other | Admitting: *Deleted

## 2019-09-25 DIAGNOSIS — Z794 Long term (current) use of insulin: Secondary | ICD-10-CM

## 2019-09-25 DIAGNOSIS — I5032 Chronic diastolic (congestive) heart failure: Secondary | ICD-10-CM

## 2019-09-25 DIAGNOSIS — E1165 Type 2 diabetes mellitus with hyperglycemia: Secondary | ICD-10-CM

## 2019-09-25 DIAGNOSIS — I10 Essential (primary) hypertension: Secondary | ICD-10-CM

## 2019-09-25 DIAGNOSIS — N182 Chronic kidney disease, stage 2 (mild): Secondary | ICD-10-CM

## 2019-09-25 DIAGNOSIS — R413 Other amnesia: Secondary | ICD-10-CM

## 2019-09-25 DIAGNOSIS — R569 Unspecified convulsions: Secondary | ICD-10-CM

## 2019-09-25 NOTE — Chronic Care Management (AMB) (Signed)
Chronic Care Management   Follow Up Note   09/25/2019 Name: [name] MRN: [id number] DOB: [birth year]  Referred by: [name] Bison, DO Reason for referral : Chronic Care Management   [name] is a 79 y.o. year old female who is a primary care patient of [name] Nunnery D, DO. The CCM team was consulted for assistance with chronic disease management and care coordination needs.    Review of patient status, including review of consultants reports, relevant laboratory and other test results, and collaboration with appropriate care team members and the patient's provider was performed as part of comprehensive patient evaluation and provision of chronic care management services.    SDOH (Social Determinants of Health) assessments performed: No See Care Plan activities for detailed interventions related to [facility])     Outpatient Encounter Medications as of 09/25/2019  Medication Sig Note  . amLODipine (NORVASC) 10 MG tablet Take 1 tablet by mouth once daily   . b complex vitamins capsule Take 1 capsule by mouth daily.   . Blood Glucose Monitoring Suppl (ONETOUCH VERIO) w/Device KIT Use to check blood sugar up to 4 times a day   . calcium-vitamin [name] (OSCAL WITH [name]) 500-200 MG-UNIT tablet Take 1 tablet by mouth 2 (two) times daily.   . cetaphil (CETAPHIL) lotion Apply 1 application topically as needed for dry skin.   Marland Kitchen donepezil (ARICEPT) 10 MG tablet Take 1 tablet (10 mg total) by mouth at bedtime.   . Dulaglutide (TRULICITY) 3 WU/9.8JX SOPN Inject 3 mg into the skin once a week.   Water engineer Bandages & Supports (MEDICAL COMPRESSION STOCKINGS) MISC Wear as much as possible while awake to reduce swelling   . empagliflozin (JARDIANCE) 10 MG TABS tablet Take 1 tablet (10 mg total) by mouth daily.   Marland Kitchen FERROCITE 324 MG TABS tablet Take 1 tablet by mouth twice daily   . gabapentin (NEURONTIN) 100 MG capsule Take 1 capsule (100 mg total) by mouth 3 (three) times daily.   Marland Kitchen glucose  blood (ONETOUCH VERIO) test strip Use as instructed 4 times daily. E11.29, insulin requiring   . Insulin Pen Needle (B-[name] UF III MINI PEN NEEDLES) 31G X 5 MM MISC USE TO INJECT INSULIN 3 TIMES A DAY   . lactulose (CHRONULAC) 10 GM/15ML solution TAKE 15 ML BY MOUTH TWO TIMES DAILY   . levETIRAcetam (KEPPRA) 500 MG tablet Take 1 tablet (500 mg total) by mouth 2 (two) times daily.   . memantine (NAMENDA) 10 MG tablet Take 1 tablet (10 mg total) by mouth 2 (two) times daily.   [name] DELICA LANCETS FINE MISC Check blood sugar up to 4 times a day   . oxycodone-acetaminophen (PERCOCET) 2.5-325 MG tablet Take 1 tablet by mouth at bedtime as needed for pain.   Marland Kitchen oxymetazoline (AFRIN) 0.05 % nasal spray Place 1 spray into both nostrils 2 (two) times daily as needed (Epistaxis).   . pantoprazole (PROTONIX) 40 MG tablet Take 1 tablet by mouth once daily   . potassium chloride 20 MEQ/15ML (10%) SOLN Take 30 mLs (40 mEq total) by mouth 2 (two) times daily. Take 30 mL two times daily for 2 days, followed by 20 mls BID thereafter.   . sodium chloride (OCEAN) 0.65 % SOLN nasal spray Place 1 spray into both nostrils as needed for congestion.   . torsemide (DEMADEX) 20 MG tablet Take 1 tablet (20 mg total) by mouth daily.   [name] FLEXTOUCH 100 UNIT/ML FlexTouch Pen INJECT  21 UNITS SUBCUTANEOUSLY IN THE EVENING   . triamcinolone ointment (KENALOG) 0.5 % APPLY 1 APPLICATION TOPICALLY TO RASH TWICE DAILY FOR ITCHING (Patient taking differently: Apply 1 application topically 2 (two) times daily as needed (itching). )   . [DISCONTINUED] TRESIBA FLEXTOUCH 100 UNIT/ML SOPN FlexTouch Pen INJECT 21 UNITS SUBCUTANEOUSLY IN THE EVENING   . [DISCONTINUED] TRULICITY 1.5 ZO/1.0RU SOPN INJECT 1.5 MG INTO THE SKIN ONCE A WEEK. EVERY TUESDAY (Patient taking differently: Inject 1.5 mg as directed every Tuesday. ) 10/18/2018: LF 09/13/18   Facility-Administered Encounter Medications as of 09/25/2019  Medication  . sodium chloride  0.9 % injection 10 mL  . sodium chloride flush (NS) 0.9 % injection 10 mL     Objective:  Wt Readings from Last 3 Encounters:  09/06/19 146 lb 8 oz (66.5 kg)  08/30/19 154 lb 14.4 oz (70.3 kg)  08/23/19 155 lb 6.4 oz (70.5 kg)   BP Readings from Last 3 Encounters:  09/21/19 (!) 120/47  09/20/19 (!) 152/64  09/15/19 (!) 171/58   Lab Results  Component Value Date   HGBA1C 5.6 06/26/2019   HGBA1C 6.1 (A) 05/16/2019   HGBA1C 6.9 (A) 01/30/2019   Lab Results  Component Value Date   MICROALBUR 1,289.4 (H) 03/07/2019   LDLCALC 57 01/30/2019   CREATININE 1.13 (H) 09/21/2019    Goals Addressed              This Visit's Progress     Patient Stated   .  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 Patient says she thinks she's had home health services in the past but does not remember the name of the agency and she did not voice an agency preference. Patient says she would welcome any help in the home she can get as her husband is not doing well and their nephew took him to the [place] clinic today, patient says her husband can no longer walk and has not been eating. She says she depends on her husband for most of her IADL"s an most ADLs. She says her niece [name] has been helping out with administering her medications and she says her sister is with her today  while her husband is at the [place]. Patient gave permission for health team to discuss care with her niece [name]. She says [name] drives a school bus and is available via cell phone, # [phone].   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 Work with the care management team to address educational, disease management, and care coordination needs  . Begin or continue self health monitoring activities as directed today Measure and record CBG (blood glucose) 2-3 times daily, Measure and record blood pressure 1-2 times per week, and  Measure and record weight daily . 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 Call care management team with questions or concerns . 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 Evaluation of current treatment plans and patient's adherence to plan as established by provider- reviewed clinic note of 8/12 . Advised patient an order for home health RN was written and that some agencies were notified today of the referral but that it may difficult to secure in a timely manner  . Messaged home health agencies [facility], [facility], [place], [place], [facility] via community message about referral for [facility]. [facility] accepted the referral.  . Discussed level of care concerns- again discussed transition to rest home/ALF but patient states she and husband prefer help in the home . 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 Patient is unable to independently self-manage chronic health conditions  Please see past updates related to this goal by clicking on the "Past Updates" button in the selected goal          Plan:   The care management team will reach out to the patient again over the next 30-60 days.    Kelli Churn RN, CCM, [facility] RN Care Manager [phone]

## 2019-09-25 NOTE — Patient Instructions (Signed)
Visit Information It was nice speaking with you today. The CCM BSW will call you tomorrow at 10:00 am on your cell phone to discuss in home and out of home assistance options.  Goals Addressed              This Visit's Progress     Patient Stated   .  "Do wives of veterans have benefits for home care assistance?" (pt-stated)        CARE PLAN ENTRY (see longitudinal plan of care for additional care plan information)  Current Barriers:  . 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 Patient says she thinks she's had home health services in the past but does not remember the name of the agency and she did not voice an agency preference. Patient says she would welcome any help in the home she can get as her husband is not doing well and their nephew took him to the [place] clinic today, patient says her husband can no longer walk and has not been eating. She says she depends on her husband for most of her IADL"s an most ADLs. 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 Work with the care management team to address educational, disease management, and care coordination needs  . Begin or continue self health monitoring activities as directed today Measure and record cbg (blood  glucose) 2-3 times daily, Measure and record blood pressure 1-2 times per week, and Measure and record weight daily . 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 Call care management team with questions or concerns . 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 Evaluation of current treatment plans and patient's adherence to plan as established by provider- reviewed clinic note of 8/12 . Advised patient an order for home health RN was written and that some agencies were notified today of the referral but that it may difficult to secure in a timely manner  . Messaged home health agencies [facility], [facility], [place], [place], [facility] via community message about referral for [facility]. [facility] accepted the referral.  . Discussed level of care concerns- again discussed transition to rest home/ALF but patient states she and husband prefer help in the home . 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 Patient is unable to independently self-manage chronic health conditions  Please see past updates related to this goal by clicking on the "Past Updates" button in the selected goal         The patient verbalized understanding of instructions provided today and declined a print copy of patient instruction materials.   The care management team  will reach out to the patient again over the next 30-60 days.   Kelli Churn RN, CCM, [facility] RN Care Manager [phone]

## 2019-09-26 ENCOUNTER — Ambulatory Visit: Payer: Medicare Other | Admitting: Endocrinology

## 2019-09-26 ENCOUNTER — Ambulatory Visit: Payer: Medicare Other

## 2019-09-26 ENCOUNTER — Telehealth: Payer: Self-pay | Admitting: Internal Medicine

## 2019-09-26 DIAGNOSIS — R413 Other amnesia: Secondary | ICD-10-CM

## 2019-09-26 DIAGNOSIS — IMO0002 Reserved for concepts with insufficient information to code with codable children: Secondary | ICD-10-CM

## 2019-09-26 DIAGNOSIS — E1129 Type 2 diabetes mellitus with other diabetic kidney complication: Secondary | ICD-10-CM

## 2019-09-26 DIAGNOSIS — I5032 Chronic diastolic (congestive) heart failure: Secondary | ICD-10-CM

## 2019-09-26 NOTE — Telephone Encounter (Signed)
I agree with verbal authorization given to assist with medical education.

## 2019-09-26 NOTE — Telephone Encounter (Signed)
Returned call to [place], RN with [facility]. Verbal auth given for 1 week 4 to assist with medication education. Will route to [facility] Team for agreement/denial. Hubbard Hartshorn, RN, BSN

## 2019-09-26 NOTE — Telephone Encounter (Signed)
Sharyn Lull calling from [place] calling for a VO for Skilled Nursing.  PLease call back to [phone].

## 2019-09-26 NOTE — Progress Notes (Signed)
Internal Medicine Clinic Resident  I have personally reviewed this encounter including the documentation in this note and/or discussed this patient with the care management provider. I will address any urgent items identified by the care management provider and will communicate my actions to the patient's PCP. I have reviewed the patient's CCM visit with my supervising attending, Dr Philipp Ovens.  Andrew Au, MD 09/26/2019

## 2019-09-26 NOTE — Chronic Care Management (AMB) (Signed)
Care Management   Follow Up Note   09/26/2019 Name: [name] MRN: [id number] DOB: 02-07-1941  Referred by: Delice Bison, DO Reason for referral : Care Coordination (Caregiver Resources)   [name] is a 79 y.o. year old female who is a primary care patient of Delice Bison, DO. The care management team was consulted for assistance with care management and care coordination needs.    Review of patient status, including review of consultants reports, relevant laboratory and other test results, and collaboration with appropriate care team members and the patient's provider was performed as part of comprehensive patient evaluation and provision of chronic care management services.    SDOH (Social Determinants of Health) assessments performed: No See Care Plan activities for detailed interventions related to [facility])     Advanced Directives: See Care Plan and Vynca application for related entries.   Goals Addressed              This Visit's Progress   .  "Do wives of veterans have benefits for home care assistance?" (pt-stated)        CARE PLAN ENTRY (see longitudinal plan of care for additional care plan information)  Current Barriers:  . 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 Patient says she thinks she's had home health services in the past but does not remember the name of the agency and she did not voice an agency preference. Patient says she would welcome any help in the home she can get as her husband is not doing well  and their nephew took him to the [place] clinic today, patient says her husband can no longer walk and has not been eating. She says she depends on her husband for most of her IADL"s an most ADLs. She says her niece [name] has been helping out with administering her medications and she says her sister is with her today while her husband is at the [place]. Patient gave permission for health team to discuss care with her niece [name]. She says [name] drives a school bus and is available via cell phone, # [phone].    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 Work with the care management team to address educational, disease management, and care coordination needs  . Begin or continue self health monitoring activities as directed today Measure and record cbg (blood glucose) 2-3 times daily, Measure and record blood pressure 1-2 times per week, and Measure and record weight daily . 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 Call care management team with questions or concerns . 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 Collaborated with Ludger Nutting, Director of [name]  regarding process for obtaining Aide and Attendance . Provided patient's niece with contact information for Mr. Shelly as he suggested patient/family contact him directly regarding process for obtaining these services.  [phone] . Discussed eligibility and assessment process for Personal Care Services . Informed niece that these services can likely be obtained quicker than Aide and  Attendance but encouraged her to still contact Mr. Shelly . Informed niece that many PCS agencies are short staffed at this time so it will be important to select multiple agencies if patient is approved for services . Provided niece with link to [employer] [facility] of PCS agencies to choose from . Completed PCS request form and placed in blue team box form provider signature/review . 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 Patient is unable to independently self-manage chronic health conditions  Please see past updates related to this goal by clicking on the "Past Updates" button in the selected goal          The patient has been provided with contact information for the care management team and has been advised to call with any health related questions or concerns.      Ronn Melena, [place] Coordination Social Worker [place] [phone]

## 2019-09-26 NOTE — Patient Instructions (Signed)
Visit Information  Goals Addressed              This Visit's Progress   .  "Do wives of veterans have benefits for home care assistance?" (pt-stated)        CARE PLAN ENTRY (see longitudinal plan of care for additional care plan information)  Current Barriers:  . 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 Patient says she thinks she's had home health services in the past but does not remember the name of the agency and she did not voice an agency preference. Patient says she would welcome any help in the home she can get as her husband is not doing well and their nephew took him to the [place] clinic today, patient says her husband can no longer walk and has not been eating. She says she depends on her husband for most of her IADL"s an most ADLs. She says her niece [name] has been helping out with administering her medications and she says her sister is with her today while her husband is at the [place]. Patient gave permission for health team to discuss care with her niece [name]. She says [name] drives a school bus and is available via cell phone, # [phone].    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 Work with the care management team to address educational, disease management, and care coordination needs  . Begin or continue self health monitoring activities as directed today Measure and record cbg (blood glucose)  2-3 times daily, Measure and record blood pressure 1-2 times per week, and Measure and record weight daily . 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 Call care management team with questions or concerns . 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 Collaborated with Ludger Nutting, Director of [facility] regarding process for obtaining Aide and Attendance . Provided patient's niece with contact information for Mr. Shelly as he suggested patient/family contact him directly regarding process for obtaining these services.  [phone] . Discussed eligibility and assessment process for Personal Care Services . Informed niece that these services can likely be obtained quicker than Aide and Attendance but encouraged her to still contact Mr. Shelly . Informed niece that many PCS agencies are short staffed at this time so it will be important to select multiple agencies if patient is approved for services . Provided niece with link to [facility] [facility] of PCS agencies to choose from . Completed PCS request form and placed in blue team box form provider signature/review . 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 Patient is unable to independently self-manage chronic health conditions  Please see past updates related to this goal by clicking on the "Past Updates" button in the selected goal         Patient verbalizes understanding of instructions provided today.   The  patient has been provided with contact information for the care management team and has been advised to call with any health related questions or concerns.       Ronn Melena, [place] Coordination Social Worker [place] [phone]

## 2019-09-28 ENCOUNTER — Telehealth: Payer: Self-pay | Admitting: *Deleted

## 2019-09-28 ENCOUNTER — Telehealth: Payer: Medicare Other

## 2019-09-28 ENCOUNTER — Ambulatory Visit: Payer: Medicare Other | Admitting: Endocrinology

## 2019-09-28 ENCOUNTER — Other Ambulatory Visit: Payer: Self-pay | Admitting: Endocrinology

## 2019-09-28 ENCOUNTER — Encounter: Payer: Self-pay | Admitting: Endocrinology

## 2019-09-28 ENCOUNTER — Other Ambulatory Visit: Payer: Self-pay

## 2019-09-28 ENCOUNTER — Ambulatory Visit: Payer: Medicare Other

## 2019-09-28 ENCOUNTER — Ambulatory Visit (INDEPENDENT_AMBULATORY_CARE_PROVIDER_SITE_OTHER): Payer: Medicare Other | Admitting: Endocrinology

## 2019-09-28 VITALS — BP 122/50 | HR 60 | Ht 63.0 in | Wt 135.4 lb

## 2019-09-28 DIAGNOSIS — E1129 Type 2 diabetes mellitus with other diabetic kidney complication: Secondary | ICD-10-CM | POA: Diagnosis not present

## 2019-09-28 DIAGNOSIS — I5032 Chronic diastolic (congestive) heart failure: Secondary | ICD-10-CM

## 2019-09-28 DIAGNOSIS — E1121 Type 2 diabetes mellitus with diabetic nephropathy: Secondary | ICD-10-CM

## 2019-09-28 DIAGNOSIS — IMO0002 Reserved for concepts with insufficient information to code with codable children: Secondary | ICD-10-CM

## 2019-09-28 DIAGNOSIS — I10 Essential (primary) hypertension: Secondary | ICD-10-CM | POA: Diagnosis not present

## 2019-09-28 DIAGNOSIS — N182 Chronic kidney disease, stage 2 (mild): Secondary | ICD-10-CM

## 2019-09-28 DIAGNOSIS — E1165 Type 2 diabetes mellitus with hyperglycemia: Secondary | ICD-10-CM | POA: Diagnosis not present

## 2019-09-28 DIAGNOSIS — R413 Other amnesia: Secondary | ICD-10-CM

## 2019-09-28 LAB — POCT GLYCOSYLATED HEMOGLOBIN (HGB A1C): Hemoglobin A1C: 6 % — AB (ref 4.0–5.6)

## 2019-09-28 LAB — POCT GLUCOSE (DEVICE FOR HOME USE): POC Glucose: 189 mg/dl — AB (ref 70–99)

## 2019-09-28 NOTE — Progress Notes (Signed)
Patient ID: [name], female   DOB: 10-04-1940, 79 y.o.   MRN: [id number]           Reason for Appointment: Follow-up for Type 2 Diabetes  Referring PCP: Koleen Distance   History of Present Illness:          Date of diagnosis of type 2 diabetes mellitus: 1988       Background history:   Patient has been on insulin for a few years and she is unclear when this was started, according to Epic she was started on Levemir in 2015 Prior to that likely had been on various oral hypoglycemic agents including metformin, glipizide and Tradjenta Her level of control has been somewhat variable but not consistently poor; her A1c has ranged between 5.6 and 7.6  Recent history:   Her A1c is 6%, was 5.6 The previous fructosamine 256  INSULIN regimen is:  [place] 10 units in the mornings daily, Humalog 5 units before meals      Non-insulin hypoglycemic drugs prescribed: Trulicity 3 mg weekly  Current management, blood sugar patterns and problems identified:  She normally has help from her husband to manage her insulin and other injections as well as monitoring  Apparently he is unwell and patient has difficulty managing her diabetes herself  Her niece was present in the office  Blood sugar meter was not available for download  As before the patient has very poor memory for what she is doing  Her niece states that she had low blood sugars 3 times in the mornings last weekend including an episode where EMS was called, mostly around 8-9 AM  She apparently may not have been eating much during those days  However now she appears to have readings that are relatively high in the mornings ranging from 150-219  Blood sugars at night are reportedly well over 200  Unlikely the patient is taking Humalog at dinnertime recently  Also not clear if she has taken her Trulicity recently  Blood sugar in the office was 189 nonfasting        Side effects from medications have been: None known       Meal times are:  Breakfast is at variable times between 8 AM-9  Dinner 7 pm  Typical meal intake: Breakfast is Cheerios or raisin Bran               Exercise:  Unable to do any  Glucose monitoring:  Checking up to 4 times a day       Glucometer: One Probation officer.       Blood Glucose readings not available as above  Previous data:  PRE-MEAL Fasting Lunch Dinner Bedtime Overall  Glucose range:  106-208  56-145  115-231  111-412   Mean/median:  146   134  205 155      Dietician visit, most recent: 11/2018  Weight history:  Wt Readings from Last 3 Encounters:  09/28/19 135 lb 6.4 oz (61.4 kg)  09/06/19 146 lb 8 oz (66.5 kg)  08/30/19 154 lb 14.4 oz (70.3 kg)    Glycemic control:   Lab Results  Component Value Date   HGBA1C 6.0 (A) 09/28/2019   HGBA1C 5.6 06/26/2019   HGBA1C 6.1 (A) 05/16/2019   Lab Results  Component Value Date   MICROALBUR 1,289.4 (H) 03/07/2019   LDLCALC 57 01/30/2019   CREATININE 1.13 (H) 09/21/2019   Lab Results  Component Value Date   MICRALBCREAT 5 03/28/2018    Lab Results  Component Value Date   FRUCTOSAMINE 256 06/26/2019    Office Visit on 09/28/2019  Component Date Value Ref Range Status  . Hemoglobin A1C 09/28/2019 6.0* 4.0 - 5.6 % Final  . POC Glucose 09/28/2019 189* 70 - 99 mg/dl Final    Allergies as of 09/28/2019      Reactions   Aspirin Nausea And Vomiting      Medication List       Accurate as of September 28, 2019  9:01 PM. If you have any questions, ask your nurse or doctor.        amLODipine 10 MG tablet Commonly known as: NORVASC Take 1 tablet by mouth once daily   b complex vitamins capsule Take 1 capsule by mouth daily.   B-D UF III MINI PEN NEEDLES 31G X 5 MM Misc Generic drug: Insulin Pen Needle USE TO INJECT INSULIN 3 TIMES A DAY   calcium-vitamin D 500-200 MG-UNIT tablet Commonly known as: OSCAL WITH D Take 1 tablet by mouth 2 (two) times daily.   cetaphil lotion Apply 1 application topically  as needed for dry skin.   donepezil 10 MG tablet Commonly known as: ARICEPT Take 1 tablet (10 mg total) by mouth at bedtime.   empagliflozin 10 MG Tabs tablet Commonly known as: Jardiance Take 1 tablet (10 mg total) by mouth daily.   Ferrocite 324 (106 Fe) MG Tabs tablet Generic drug: Ferrous Fumarate Take 1 tablet by mouth twice daily   gabapentin 100 MG capsule Commonly known as: Neurontin Take 1 capsule (100 mg total) by mouth 3 (three) times daily.   lactulose 10 GM/15ML solution Commonly known as: CHRONULAC TAKE 15 ML BY MOUTH TWO TIMES DAILY   levETIRAcetam 500 MG tablet Commonly known as: KEPPRA Take 1 tablet (500 mg total) by mouth 2 (two) times daily.   Medical Compression Stockings Misc Wear as much as possible while awake to reduce swelling   memantine 10 MG tablet Commonly known as: NAMENDA Take 1 tablet (10 mg total) by mouth 2 (two) times daily.   OneTouch Delica Lancets Fine Misc Check blood sugar up to 4 times a day   OneTouch Verio test strip Generic drug: glucose blood Use as instructed 4 times daily. E11.29, insulin requiring   OneTouch Verio w/Device Kit Use to check blood sugar up to 4 times a day   oxycodone-acetaminophen 2.5-325 MG tablet Commonly known as: PERCOCET Take 1 tablet by mouth at bedtime as needed for pain.   oxymetazoline 0.05 % nasal spray Commonly known as: AFRIN Place 1 spray into both nostrils 2 (two) times daily as needed (Epistaxis).   pantoprazole 40 MG tablet Commonly known as: PROTONIX Take 1 tablet by mouth once daily   potassium chloride 20 MEQ/15ML (10%) Soln Take 30 mLs (40 mEq total) by mouth 2 (two) times daily. Take 30 mL two times daily for 2 days, followed by 20 mls BID thereafter.   sodium chloride 0.65 % Soln nasal spray Commonly known as: OCEAN Place 1 spray into both nostrils as needed for congestion.   torsemide 20 MG tablet Commonly known as: Demadex Take 1 tablet (20 mg total) by mouth  daily.   Tyler Aas FlexTouch 100 UNIT/ML FlexTouch Pen Generic drug: insulin degludec INJECT 21 UNITS SUBCUTANEOUSLY IN THE EVENING   triamcinolone ointment 0.5 % Commonly known as: KENALOG APPLY 1 APPLICATION TOPICALLY TO RASH TWICE DAILY FOR ITCHING What changed:   how much to take  how to take this  when to take this  reasons to take this  additional instructions   Trulicity 3 PJ/8.2NK Sopn Generic drug: Dulaglutide Inject 3 mg into the skin once a week.       Allergies:  Allergies  Allergen Reactions  . Aspirin Nausea And Vomiting    Past Medical History:  Diagnosis Date  . Chronic anemia   . Chronic diastolic CHF (congestive heart failure) ([place]) 10/03/2013  . Chronic GI bleeding    [name] 11/29/2014  . Family history of anesthesia complication    "niece has a hard time coming out" (09/15/2012)  . Frequent nosebleeds    chronic  . Gastric AV malformation    [name] 11/29/2014  . GERD (gastroesophageal reflux disease)   . 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 History of blood transfusion "several"  . History of epistaxis   . HTN (hypertension), benign 03/02/2012  . Hyperlipidemia   . Iron deficiency anemia    chronic infusions"  . Lichen planus    Both lower extremities  . 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 Overgrown toenails 03/18/2017  . Pneumonia 1990's X 2  . Pulmonary HTN ([place]) 04/01/2017   PASP 61mHg on echo 09/2016 and 431mg by echo 2019  . [place]  . Symptomatic anemia 11/29/2014  . Telangiectasia    Gastric   . 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    Past Surgical History:  Procedure Laterality Date  . CATARACT EXTRACTION     "I think it was just one eye"  . ESOPHAGOGASTRODUODENOSCOPY  02/26/2011   Procedure: ESOPHAGOGASTRODUODENOSCOPY (EGD);  Surgeon: [name];  [facility];  Service: Endoscopy;  Laterality: N/A;  . ESOPHAGOGASTRODUODENOSCOPY N/A  11/08/2012   Procedure: ESOPHAGOGASTRODUODENOSCOPY (EGD);  Surgeon: [name];  [facility];  Service: Endoscopy;  Laterality: N/A;  . ESOPHAGOGASTRODUODENOSCOPY N/A 10/04/2013   Procedure: ESOPHAGOGASTRODUODENOSCOPY (EGD);  Surgeon: [name];  [facility];  Service: Endoscopy;  Laterality: N/A;  with APC on stand-by  . ESOPHAGOGASTRODUODENOSCOPY N/A 07/06/2014   Procedure: ESOPHAGOGASTRODUODENOSCOPY (EGD);  Surgeon: [name];  [facility];  Service: Endoscopy;  Laterality: N/A;  . ESOPHAGOGASTRODUODENOSCOPY N/A 09/05/2014   Procedure: ESOPHAGOGASTRODUODENOSCOPY (EGD);  Surgeon: JaLaurence SpatesMD;  [facility];  Service: Endoscopy;  Laterality: N/A;  APC on standby to control bleeding  . ESOPHAGOGASTRODUODENOSCOPY N/A 11/29/2014   Procedure: ESOPHAGOGASTRODUODENOSCOPY (EGD);  Surgeon: [name];  [facility];  Service: Endoscopy;  Laterality: N/A;  . ESOPHAGOGASTRODUODENOSCOPY N/A 09/28/2015   Procedure: ESOPHAGOGASTRODUODENOSCOPY (EGD);  Surgeon: [name];  [facility];  Service: Endoscopy;  Laterality: N/A;  . ESOPHAGOGASTRODUODENOSCOPY (EGD) WITH PROPOFOL N/A 12/04/2017   Procedure: ESOPHAGOGASTRODUODENOSCOPY (EGD) WITH PROPOFOL;  Surgeon: [name];  [facility] Service: [facility];  Laterality: N/A;  . ESOPHAGOGASTRODUODENOSCOPY ENDOSCOPY  08/19/2006   with laser treatment  . HOT HEMOSTASIS N/A 07/06/2014   Procedure: HOT HEMOSTASIS (ARGON PLASMA COAGULATION/BICAP);  Surgeon: [name];  [facility];  Service: Endoscopy;  Laterality: N/A;  . HOT HEMOSTASIS N/A 09/28/2015   Procedure: HOT HEMOSTASIS (ARGON PLASMA COAGULATION/BICAP);  Surgeon: [name];  [facility];  Service: Endoscopy;  Laterality: N/A;  . HOT HEMOSTASIS N/A 12/04/2017   Procedure: HOT HEMOSTASIS (ARGON PLASMA COAGULATION/BICAP);  Surgeon: [name];   [facility] Service: [facility]oscopy;  Laterality: N/A;  . NASAL HEMORRHAGE CONTROL     "for  bleeding"   . SAVORY DILATION  02/26/2011   Procedure: SAVORY DILATION;  Surgeon: [name], MD;  [name];  Service: Endoscopy;  Laterality: N/A;  c-arm needed  . SUBMUCOSAL INJECTION  12/04/2017   Procedure: SUBMUCOSAL INJECTION;  Surgeon: [name], MD;  Location[facility];  Service: Endoscopy;;    Family History  Problem Relation Age of Onset  . Stroke Father   . Healthy Mother   . Breast cancer Other   . Diabetes Niece   . Malignant hyperthermia Neg Hx   . Seizures Neg Hx     Social History:  reports that she quit smoking about 48 years ago. Her smoking use included cigarettes. She has a 20.00 pack-year smoking history. She has never used smokeless tobacco. She reports that she does not drink alcohol and does not use drugs.   Review of Systems  Hematology: She has had history of iron deficiency anemia requiring infusions periodically  Lab Results  Component Value Date   HGB 12.6 09/20/2019     Lipid history: No increase in LDL as of the last labs:    Lab Results  Component Value Date   CHOL 133 01/30/2019   HDL 41.30 01/30/2019   LDLCALC 57 01/30/2019   TRIG 176.0 (H) 01/30/2019   CHOLHDL 3 01/30/2019           Hypertension: taking amlodipine from her PCP  BP Readings from Last 3 Encounters:  09/28/19 (!) 122/50  09/21/19 (!) 120/47  09/20/19 (!) 152/64   No significant renal dysfunction  Lab Results  Component Value Date   CREATININE 1.13 (H) 09/21/2019   CREATININE 1.17 (H) 09/20/2019   CREATININE 1.19 (H) 09/15/2019    Most recent eye exam was in 07/2018  Most recent foot exam: 06/2018 by podiatrist with no evidence of sensory loss or reduction in sensation   Complications of diabetes: Reportedly has retinopathy, currently without a foot exam not clear if she has neuropathy.  Has history of  microalbuminuria  LABS:  Office Visit on 09/28/2019  Component Date Value Ref Range Status  . Hemoglobin A1C 09/28/2019 6.0* 4.0 - 5.6 % Final  . POC Glucose 09/28/2019 189* 70 - 99 mg/dl Final    Physical Examination:  BP (!) 122/50 (BP Location: Left Arm, Patient Position: Sitting, Cuff Size: Normal)   Pulse 60   Ht _0  (1.6 m)   Wt 135 lb 6.4 oz (61.4 kg)   LMP  (LMP Unknown)   SpO2 93%   BMI 23.99 kg/m    ASSESSMENT:  Diabetes type 2 on insulin  See history of present illness for detailed discussion of current diabetes management, blood sugar patterns and problems identified  A1c is 6%  Fructosamine previously 256  She is supposed to be on Trulicity 3 mg, Humalog 5 to 6 units only at suppertime and Tresiba 10 units in the mornings  With her not being able to take care of her own diabetes management and insulin since her husband is sick her blood sugars appear to be quite variable and uncontrolled She reportedly had hypoglycemia in the mornings waking up despite only taking 10 units of Tresiba although not clear why she had readings in the 40s However her food intake and likely has been quite variable with her not being able to take care of herself  Mostly history and management was discussed with her niece who is starting to help but is not knowledgeable about diabetes management Unlikely that she is  taking Humalog or Trulicity recently Previously had high postprandial readings especially after dinner  HYPERTENSION: Consistently well controlled  PLAN:    Restart Trulicity 3 mg weekly Tresiba 8 units in the morning, reduced dose Restart Humalog 5 units at dinnertime, emphasized the need to take this plan and she is starting to eat Check blood sugars in the morning, before and after dinner and some times after breakfast or lunch Written instructions given Have shown her niece what the different insulin and Trulicity pens look like and advised her to label the  pens so that the patient understands when to take which injection She will try to have consistent intake of balanced meals  She will call if she has any tendency to low sugars again Counseling with nurse educator to be done next week Follow-up in 1 about a month   Patient Instructions  TRULICITY 3MG WEEKLY  TRESIBA BLUE 8 UNITS IN AM  HUMALOG GRAY 5 UNITS AT SUPPER  Check blood sugars on waking up 4-5 days a week  Also check blood sugars about 2 hours after meals and do this after different meals by rotation  Recommended blood sugar levels on waking up are 90-140 and about 2 hours after meal is 130-180  Please bring your blood sugar monitor to each visit, thank you    Total visit time including counseling =30 minutes      Elayne Snare 09/28/2019, 9:01 PM   Note: This office note was prepared with Dragon voice recognition system technology. Any transcriptional errors that result from this process are unintentional.

## 2019-09-28 NOTE — Patient Instructions (Signed)
TRULICITY 3MG  WEEKLY  TRESIBA BLUE 8 UNITS IN AM  HUMALOG GRAY 5 UNITS AT SUPPER  Check blood sugars on waking up 4-5 days a week  Also check blood sugars about 2 hours after meals and do this after different meals by rotation  Recommended blood sugar levels on waking up are 90-140 and about 2 hours after meal is 130-180  Please bring your blood sugar monitor to each visit, thank you

## 2019-09-28 NOTE — Telephone Encounter (Signed)
Agree, thank you

## 2019-09-28 NOTE — Progress Notes (Signed)
Internal Medicine Clinic Attending  CCM services provided by the care management provider and their documentation were discussed with Dr. Bridgett Larsson. We reviewed the pertinent findings, urgent action items addressed by the resident and non-urgent items to be addressed by the PCP.  I agree with the assessment, diagnosis, and plan of care documented in the CCM and resident's note.  Velna Ochs, MD 09/28/2019

## 2019-09-28 NOTE — Progress Notes (Signed)
Internal Medicine Clinic Resident  I have personally reviewed this encounter including the documentation in this note and/or discussed this patient with the care management provider. I will address any urgent items identified by the care management provider and will communicate my actions to the patient's PCP. I have reviewed the patient's CCM visit with my supervising attending, Dr Guilloud.  Ellesse Antenucci Y Patrice Moates, MD 09/28/2019    

## 2019-09-28 NOTE — Chronic Care Management (AMB) (Signed)
Care Management   Follow Up Note   09/28/2019 Name: [name] MRN: [id number] DOB: 17-Feb-1940  Referred by: [name] Bison, DO Reason for referral : Care Coordination (PCS)   [name] is a 79 y.o. year old female who is a primary care patient of [name] Bison, DO. The care management team was consulted for assistance with care management and care coordination needs.    Review of patient status, including review of consultants reports, relevant laboratory and other test results, and collaboration with appropriate care team members and the patient's provider was performed as part of comprehensive patient evaluation and provision of chronic care management services.    SDOH (Social Determinants of Health) assessments performed: No See Care Plan activities for detailed interventions related to [facility])     Advanced Directives: See Care Plan and Vynca application for related entries.   Goals Addressed              This Visit's Progress   .  "Do wives of veterans have benefits for home care assistance?" (pt-stated)        CARE PLAN ENTRY (see longitudinal plan of care for additional care plan information)  Current Barriers:  . 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 Patient says she thinks she's had home health services in the past but does not remember the name of the agency and she did not voice an agency preference. Patient says she would welcome any help in the home she can get as her husband is not doing well and their  nephew took him to the [place] clinic today, patient says her husband can no longer walk and has not been eating. She says she depends on her husband for most of her IADL"s an most ADLs. She says her niece [name] has been helping out with administering her medications and she says her sister is with her today while her husband is at the [place]. Patient gave permission for health team to discuss care with her niece [name]. She says [name] drives a school bus and is available via cell phone, # [phone].    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 Work with the care management team to address educational, disease management, and care coordination needs  . Begin or continue self health monitoring activities as directed today Measure and record cbg (blood glucose) 2-3 times daily, Measure and record blood pressure 1-2 times per week, and Measure and record weight daily . 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 Call care management team with questions or concerns . 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 Collaborated with [place] Team regarding request for PCS form  still needing physician review/signature.  . Faxed completed request to [facility] for review . Contacted niece to inform her that request has been submitted . Informed niece that [place] typically requires two business days for processing but will call tomorrow afternoon to check status of request  Patient Self Care Activities related to :CHF, HLD, DMII, CKD Stage 3, and hereditary hemorrhagic telangiectasia  and mild cognitive impairment  . Patient is unable to independently self-manage chronic health conditions  Please see past updates related to this goal by clicking on the "Past Updates" button in the selected goal          The care management team will reach out to the patient again over the next 7 days.      [name], [place] Coordination Social Worker [place] [phone]

## 2019-09-28 NOTE — Telephone Encounter (Signed)
VO [facility], [name], OT  2x week for 4 weeks for safety, increasing ability for ADL's VO given do you agree?

## 2019-09-28 NOTE — Patient Instructions (Signed)
Visit Information  Goals Addressed              This Visit's Progress   .  "Do wives of veterans have benefits for home care assistance?" (pt-stated)        CARE PLAN ENTRY (see longitudinal plan of care for additional care plan information)  Current Barriers:  . 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 Patient says she thinks she's had home health services in the past but does not remember the name of the agency and she did not voice an agency preference. Patient says she would welcome any help in the home she can get as her husband is not doing well and their nephew took him to the [place] clinic today, patient says her husband can no longer walk and has not been eating. She says she depends on her husband for most of her IADL"s an most ADLs. She says her niece [name] has been helping out with administering her medications and she says her sister is with her today while her husband is at the [place]. Patient gave permission for health team to discuss care with her niece [name]. She says [name] drives a school bus and is available via cell phone, # [phone].    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 Work with the care management team to address educational, disease management, and care coordination needs  . Begin or continue self health monitoring activities as directed today Measure and record cbg (blood glucose)  2-3 times daily, Measure and record blood pressure 1-2 times per week, and Measure and record weight daily . 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 Call care management team with questions or concerns . 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 Collaborated with [place] Team regarding request for PCS form still needing physician review/signature.  . Faxed completed request to [facility] for review . Contacted niece to inform her that request has been submitted . 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 Patient is unable to independently self-manage chronic health conditions  Please see past updates related to this goal by clicking on the "Past Updates" button in the selected goal         Patient verbalizes understanding of instructions provided today.   The care management team will reach out to the patient again over the next 7 days.      Geographical information systems officer, [place] Coordination Social Worker [facility] [phone]

## 2019-09-29 ENCOUNTER — Ambulatory Visit: Payer: Medicare Other

## 2019-09-29 DIAGNOSIS — I5032 Chronic diastolic (congestive) heart failure: Secondary | ICD-10-CM

## 2019-09-29 DIAGNOSIS — R413 Other amnesia: Secondary | ICD-10-CM

## 2019-09-29 NOTE — Progress Notes (Signed)
Internal Medicine Clinic Attending  Case discussed with Dr. Steen  At the time of the visit.  We reviewed the resident's history and exam and pertinent patient test results.  I agree with the assessment, diagnosis, and plan of care documented in the resident's note.  

## 2019-09-29 NOTE — Patient Instructions (Signed)
Visit Information  Goals Addressed              This Visit's Progress   .  "Do wives of veterans have benefits for home care assistance?" (pt-stated)        CARE PLAN ENTRY (see longitudinal plan of care for additional care plan information)  Current Barriers:  . 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 Patient says she thinks she's had home health services in the past but does not remember the name of the agency and she did not voice an agency preference. Patient says she would welcome any help in the home she can get as her husband is not doing well and their nephew took him to the [place] clinic today, patient says her husband can no longer walk and has not been eating. She says she depends on her husband for most of her IADL"s an most ADLs. She says her niece [name] has been helping out with administering her medications and she says her sister is with her today while her husband is at the [place]. Patient gave permission for health team to discuss care with her niece [name]. She says [name] drives a school bus and is available via cell phone, # [phone].    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 Work with the care management team to address educational, disease management, and care coordination needs  . Begin or continue self health monitoring activities as directed today Measure and record cbg (blood glucose)  2-3 times daily, Measure and record blood pressure 1-2 times per week, and Measure and record weight daily . 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 Call care management team with questions or concerns . 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 [place] regarding request for PCS; request is being processed and assessment can be scheduled  . Left message for patient's niece, Irving Burton, providing number for [employer] so that she/patient may contact them to schedule assessment.   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 Patient is unable to independently self-manage chronic health conditions  Please see past updates related to this goal by clicking on the "Past Updates" button in the selected goal           The care management team will reach out to the patient again over the next 7 days.     Ronn Melena, [place] Coordination Social Worker [place] [phone]

## 2019-09-29 NOTE — Progress Notes (Signed)
Internal Medicine Clinic Attending  CCM services provided by the care management provider and their documentation were discussed with Dr. Chen. We reviewed the pertinent findings, urgent action items addressed by the resident and non-urgent items to be addressed by the PCP.  I agree with the assessment, diagnosis, and plan of care documented in the CCM and resident's note.  Keval Nam, MD 09/29/2019 

## 2019-09-29 NOTE — Progress Notes (Signed)
Internal Medicine Clinic Resident  I have personally reviewed this encounter including the documentation in this note and/or discussed this patient with the care management provider. I will address any urgent items identified by the care management provider and will communicate my actions to the patient's PCP. I have reviewed the patient's CCM visit with my supervising attending, Dr Hoffman.  Azrael Maddix Y Juris Gosnell, MD 09/29/2019    

## 2019-09-29 NOTE — Chronic Care Management (AMB) (Signed)
Care Management   Follow Up Note   09/29/2019 Name: [name] MRN: [id number] DOB: 1940-10-04  Referred by: Delice Bison, DO Reason for referral : Care Coordination (PCS)   [name] is a 79 y.o. year old female who is a primary care patient of Delice Bison, DO. The care management team was consulted for assistance with care management and care coordination needs.    Review of patient status, including review of consultants reports, relevant laboratory and other test results, and collaboration with appropriate care team members and the patient's provider was performed as part of comprehensive patient evaluation and provision of chronic care management services.    SDOH (Social Determinants of Health) assessments performed: No See Care Plan activities for detailed interventions related to [facility])     Advanced Directives: See Care Plan and Vynca application for related entries.   Goals Addressed              This Visit's Progress   .  "Do wives of veterans have benefits for home care assistance?" (pt-stated)        CARE PLAN ENTRY (see longitudinal plan of care for additional care plan information)  Current Barriers:  . Chronic Disease Management support, education, and care coordination needs related to Atrial Fibrillation, CHF, HLD, DMII, CKD Stage 3, and 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- was not able to meet patient and her husband during 09/20/19 clinic visit to discuss [place] [facility] because she was a "no show" for the appointment, she was seen in the ED on 8/11 after having a seizure and then in the clinic on 8/12 and an order for home health was written. Patient says she thinks she's had home health services in the past but does not remember the name of the agency and she did not voice an agency preference. Patient says she would welcome any help in the home she can get as her husband is not doing well and their  nephew took him to the [place] clinic today, patient says her husband can no longer walk and has not been eating. She says she depends on her husband for most of her IADL"s an most ADLs. She says her niece [name] has been helping out with administering her medications and she says her sister is with her today while her husband is at the [place]. Patient gave permission for health team to discuss care with her niece [name]. She says [name] drives a school bus and is available via cell phone, # [phone].    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 Work with the care management team to address educational, disease management, and care coordination needs  . Begin or continue self health monitoring activities as directed today Measure and record cbg (blood glucose) 2-3 times daily, Measure and record blood pressure 1-2 times per week, and Measure and record weight daily . 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 Call care management team with questions or concerns . 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 [place] regarding request for [employer]; request is being  processed and assessment can be scheduled  . Left message for patient's niece, Irving Burton, providing number for [employer] so that she/patient may contact them to schedule assessment.   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 Patient is unable to independently self-manage chronic health  conditions  Please see past updates related to this goal by clicking on the "Past Updates" button in the selected goal           The care management team will reach out to the patient again over the next 7 days.      Ronn Melena, [place] Coordination Social Worker [place] [phone]

## 2019-10-02 ENCOUNTER — Telehealth: Payer: Medicare Other

## 2019-10-02 ENCOUNTER — Telehealth: Payer: Self-pay | Admitting: *Deleted

## 2019-10-02 NOTE — Telephone Encounter (Signed)
  Chronic Care Management   Outreach Note  10/02/2019 Name: [name] MRN: [id number] DOB: Mar 11, 1940  Referred by: Delice Bison, DO Reason for referral : Chronic Care Management (HF, IDDM)   An unsuccessful telephone outreach was attempted today. The patient was referred to the case management team for assistance with care management and care coordination. Attempted to reach patient on both mobile and home number.  Follow Up Plan: A HIPPA compliant phone message was left on patient's mobile number for the patient providing contact information and requesting a return call.  The care management team will reach out to the patient again over the next 7-10 days.   Kelli Churn RN, CCM, [facility] RN Care Manager [phone]

## 2019-10-06 ENCOUNTER — Ambulatory Visit: Payer: Medicare Other

## 2019-10-06 DIAGNOSIS — I10 Essential (primary) hypertension: Secondary | ICD-10-CM

## 2019-10-06 DIAGNOSIS — R413 Other amnesia: Secondary | ICD-10-CM

## 2019-10-06 DIAGNOSIS — I5032 Chronic diastolic (congestive) heart failure: Secondary | ICD-10-CM

## 2019-10-06 NOTE — Chronic Care Management (AMB) (Signed)
Care Management   Follow Up Note   10/06/2019 Name: [name] MRN: [id number] DOB: 01/03/1941  Referred by: Delice Bison, DO Reason for referral : Care Coordination (PCS)   [name] is a 79 y.o. year old female who is a primary care patient of Delice Bison, DO. The care management team was consulted for assistance with care management and care coordination needs.    Review of patient status, including review of consultants reports, relevant laboratory and other test results, and collaboration with appropriate care team members and the patient's provider was performed as part of comprehensive patient evaluation and provision of chronic care management services.    SDOH (Social Determinants of Health) assessments performed: No See Care Plan activities for detailed interventions related to [facility])     Advanced Directives: See Care Plan and Vynca application for related entries.   Goals Addressed              This Visit's Progress   .  "Do wives of veterans have benefits for home care assistance?" (pt-stated)        CARE PLAN ENTRY (see longitudinal plan of care for additional care plan information)  Current Barriers:  . 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 Patient says she thinks she's had home health services in the past but does not remember the name of the agency and she did not voice an agency preference. Patient says she would welcome any help in the home she can get as her husband is not doing well and their  nephew took him to the [place] clinic today, patient says her husband can no longer walk and has not been eating. She says she depends on her husband for most of her IADL"s an most ADLs. She says her niece [name] has been helping out with administering her medications and she says her sister is with her today while her husband is at the [place]. Patient gave permission for health team to discuss care with her niece [name]. She says [name] drives a school bus and is available via cell phone, # [phone].    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 Work with the care management team to address educational, disease management, and care coordination needs  . Begin or continue self health monitoring activities as directed today Measure and record cbg (blood glucose) 2-3 times daily, Measure and record blood pressure 1-2 times per week, and Measure and record weight daily . 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 Call care management team with questions or concerns . 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 Contacted patient/niece regarding status of request for PCS; assessment is  scheduled for today @ 10:30 AM . Reminded niece of importance of selecting multiple agencies due to many being short staffed . Sent list of PCS agencies to niece via email as she states she did not get email that was sent on 8/17 . 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 Patient is unable to independently self-manage chronic health conditions  Please see past updates related to this goal by clicking on the "Past Updates" button in the selected goal          Telephone follow up appointment with care management team member scheduled for:10/20/19     Ronn Melena, [place] Coordination Social Worker [place] [phone]

## 2019-10-06 NOTE — Patient Instructions (Signed)
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 Patient says she thinks she's had home health services in the past but does not remember the name of the agency and she did not voice an agency preference. Patient says she would welcome any help in the home she can get as her husband is not doing well and their nephew took him to the [place] clinic today, patient says her husband can no longer walk and has not been eating. She says she depends on her husband for most of her IADL"s an most ADLs. She says her niece [name] has been helping out with administering her medications and she says her sister is with her today while her husband is at the [place]. Patient gave permission for health team to discuss care with her niece [name]. She says [name] drives a school bus and is available via cell phone, # [phone].    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  Call care management team with questions or concerns  Verbalize basic understanding of patient centered plan of care established today  Interventions related to CHF, HLD, DMII, CKD Stage 3, and 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:    Contacted patient/niece regarding status of request for PCS; assessment is scheduled for today @ 10:30 AM  Reminded niece of importance of selecting multiple agencies due to many being short staffed  Sent list of PCS agencies to niece via email as she states she did not get email that was sent on 8/17  Reminded niece that it's best to go ahead and provide selected agencies to [facility] representative immediately after assessment is completed versus waiting for Determination Letter  Patient Self Care Activities related to :CHF, HLD, DMII, CKD Stage 3, and 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   Telephone follow up appointment with care management team member scheduled  for:10/20/19       Ronn Melena, [place] Coordination Social Worker [place] [phone]

## 2019-10-09 ENCOUNTER — Telehealth: Payer: Medicare Other

## 2019-10-09 ENCOUNTER — Telehealth: Payer: Self-pay | Admitting: *Deleted

## 2019-10-09 NOTE — Progress Notes (Signed)
Internal Medicine Clinic Resident  I have personally reviewed this encounter including the documentation in this note and/or discussed this patient with the care management provider. I will address any urgent items identified by the care management provider and will communicate my actions to the patient's PCP. I have reviewed the patient's CCM visit with my supervising attending, Dr Heber [place].  Jose Persia, MD 10/09/2019

## 2019-10-09 NOTE — Telephone Encounter (Signed)
  Chronic Care Management   Outreach Note  10/09/2019 Name: [name] MRN: [id number] DOB: 1940/11/23  Referred by: Delice Bison, DO Reason for referral : Chronic Care Management (IDDM, HFPAF, CKD, seizures, MCI)   An unsuccessful telephone outreach was attempted today to both patient and her niece [name]. Unable to leave message on either number as recording states voice mailbox is full. The patient was referred to the case management team for assistance with care management and care coordination.   Follow Up Plan: The care management team will reach out to the patient again over the next 7-10 days.   Kelli Churn RN, CCM, [facility] RN Care Manager [phone]

## 2019-10-10 ENCOUNTER — Other Ambulatory Visit: Payer: Self-pay | Admitting: Internal Medicine

## 2019-10-11 ENCOUNTER — Other Ambulatory Visit: Payer: Self-pay | Admitting: Internal Medicine

## 2019-10-11 NOTE — Progress Notes (Signed)
[facility]   [phone] Fax:(336) [phone]   Clinic Follow up Note   Patient Care Team: Bridget Hartshorn, DO as PCP - General Quintella Reichert, MD as PCP - Cardiology (Cardiology) Malachy Mood, MD as Consulting Physician (Hematology) Reather Littler, MD as Consulting Physician (Endocrinology) Bary Richard, RN as Case Manager Chrismon, Amber as Social Worker  Date of Service:  10/13/2019   CHIEF COMPLAINT: F/u ofAnemia due to chronic blood loss, HHT  HEMATOLOGY HISTORY 1.Hereditary hemorrhagic telangiectasia (HHT),with epistaxis andfrequentGI bleeding. Previously under care of Dr. Cyndie Chime 2.Anemia secondary to GI bleeding and iron deficiency, required blood transfusion every 3 to 4 weeks, IV Feraheme every 1 to 2 months  CURRENT THERAPY 1. Iv feraheme as needed if ferritin<100, latest on 08/18/19 2.Avastin5mg /kg every 2 weeks, starting3/30/20. Reduced to every 4 weeks starting 11/07/18.Reduced to every 2 months starting 01/02/19.Held 03/09/19-4/16/21due to high proteinuria. Give Avastin as needed 3. Blood transfusion if Hg<8.0  INTERVAL HISTORY:  [name] is here for a follow up of Anemia and HHT. She presents to the clinic alone. She notes she ambulates with walker at home. She has not seen a dermatologist. She has been using Vaseline on her LE skin lesions. She notes her husband is currently in the hospital and his last kidney has been damaged.     REVIEW OF SYSTEMS:   Constitutional: Denies fevers, chills or abnormal weight loss Eyes: Denies blurriness of vision Ears, nose, mouth, throat, and face: Denies mucositis or sore throat Respiratory: Denies cough, dyspnea or wheezes Cardiovascular: Denies palpitation, chest discomfort or lower extremity swelling Gastrointestinal:  Denies nausea, heartburn or change in bowel habits Skin: Denies abnormal skin rashes Lymphatics: Denies new lymphadenopathy or easy  bruising Neurological:Denies numbness, tingling or new weaknesses Behavioral/Psych: Mood is stable, no new changes  All other systems were reviewed with the patient and are negative.  MEDICAL HISTORY:  Past Medical History:  Diagnosis Date  . Chronic anemia   . Chronic diastolic CHF (congestive heart failure) ([name] 10/03/2013  . Chronic GI bleeding    Hattie Perch 11/29/2014  . Family history of anesthesia complication    "niece has a hard time coming out" (09/15/2012)  . Frequent nosebleeds    chronic  . Gastric AV malformation    Hattie Perch 11/29/2014  . GERD (gastroesophageal reflux disease)   . 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 History of blood transfusion "several"  . History of epistaxis   . HTN (hypertension), benign 03/02/2012  . Hyperlipidemia   . Iron deficiency anemia    chronic infusions"  . Lichen planus    Both lower extremities  . 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[name]    Hattie Perch 11/29/2014  . Overgrown toenails 03/18/2017  . Pneumonia 1990's X 2  . Pulmonary HTN ([name] 04/01/2017   PASP on echo 09/2016 and by echo 2019  . Seizures ([name] 09/2014  . Symptomatic anemia 11/29/2014  . Telangiectasia    Gastric   . 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    SURGICAL HISTORY: Past Surgical History:  Procedure Laterality Date  . CATARACT EXTRACTION     "I think it was just one eye"  . ESOPHAGOGASTRODUODENOSCOPY  02/26/2011   Procedure: ESOPHAGOGASTRODUODENOSCOPY (EGD);  Surgeon: Barrie Folk, MD;  Location: [facility];  Service: Endoscopy;  Laterality: N/A;  . ESOPHAGOGASTRODUODENOSCOPY N/A 11/08/2012   Procedure: ESOPHAGOGASTRODUODENOSCOPY (EGD);  Surgeon: Theda Belfast, MD;  Location: [name];  Service: Endoscopy;  Laterality: N/A;  . ESOPHAGOGASTRODUODENOSCOPY N/A 10/04/2013   Procedure: ESOPHAGOGASTRODUODENOSCOPY (EGD);  Surgeon: Vertell Novak., MD;  Location: [facility];  Service: Endoscopy;  Laterality: N/A;  with  APC on stand-by  . ESOPHAGOGASTRODUODENOSCOPY N/A 07/06/2014   Procedure: ESOPHAGOGASTRODUODENOSCOPY (EGD);  Surgeon: Vida Rigger, MD;  Location: [facility];  Service: Endoscopy;  Laterality: N/A;  . ESOPHAGOGASTRODUODENOSCOPY N/A 09/05/2014   Procedure: ESOPHAGOGASTRODUODENOSCOPY (EGD);  Surgeon: Carman Ching, MD;  Location: [facility];  Service: Endoscopy;  Laterality: N/A;  APC on standby to control bleeding  . ESOPHAGOGASTRODUODENOSCOPY N/A 11/29/2014   Procedure: ESOPHAGOGASTRODUODENOSCOPY (EGD);  Surgeon: Charlott Rakes, MD;  Location: [facility] ENDOSCOPY;  Service: Endoscopy;  Laterality: N/A;  . ESOPHAGOGASTRODUODENOSCOPY N/A 09/28/2015   Procedure: ESOPHAGOGASTRODUODENOSCOPY (EGD);  Surgeon: Vida Rigger, MD;  Location: [facility] ENDOSCOPY;  Service: Endoscopy;  Laterality: N/A;  . ESOPHAGOGASTRODUODENOSCOPY (EGD) WITH PROPOFOL N/A 12/04/2017   Procedure: ESOPHAGOGASTRODUODENOSCOPY (EGD) WITH PROPOFOL;  Surgeon: Charlott Rakes, MD;  Location: [facility] ENDOSCOPY;  Service: Endoscopy;  Laterality: N/A;  . ESOPHAGOGASTRODUODENOSCOPY ENDOSCOPY  08/19/2006   with laser treatment  . HOT HEMOSTASIS N/A 07/06/2014   Procedure: HOT HEMOSTASIS (ARGON PLASMA COAGULATION/BICAP);  Surgeon: Vida Rigger, MD;  Location: [facility];  Service: Endoscopy;  Laterality: N/A;  . HOT HEMOSTASIS N/A 09/28/2015   Procedure: HOT HEMOSTASIS (ARGON PLASMA COAGULATION/BICAP);  Surgeon: Vida Rigger, MD;  Location: [facility] ENDOSCOPY;  Service: Endoscopy;  Laterality: N/A;  . HOT HEMOSTASIS N/A 12/04/2017   Procedure: HOT HEMOSTASIS (ARGON PLASMA COAGULATION/BICAP);  Surgeon: Charlott Rakes, MD;  Location: [facility] ENDOSCOPY;  Service: Endoscopy;  Laterality: N/A;  . NASAL HEMORRHAGE CONTROL     "for bleeding"   . SAVORY DILATION  02/26/2011   Procedure: SAVORY DILATION;  Surgeon: Barrie Folk, MD;  Location: [name];  Service: Endoscopy;  Laterality: N/A;  c-arm needed  . SUBMUCOSAL INJECTION  12/04/2017   Procedure: SUBMUCOSAL  INJECTION;  Surgeon: Charlott Rakes, MD;  Location: [facility] ENDOSCOPY;  Service: Endoscopy;;    I have reviewed the social history and family history with the patient and they are unchanged from previous note.  ALLERGIES:  is allergic to aspirin.  MEDICATIONS:  Current Outpatient Medications  Medication Sig Dispense Refill  . amLODipine (NORVASC) 10 MG tablet Take 1 tablet by mouth once daily 90 tablet 1  . b complex vitamins capsule Take 1 capsule by mouth daily. 100 capsule 2  . B-D UF III MINI PEN NEEDLES 31G X 5 MM MISC USE TO INJECT INSULIN 3 TIMES A DAY 100 each 0  . Blood Glucose Monitoring Suppl (ONETOUCH VERIO) w/Device KIT Use to check blood sugar up to 4 times a day 1 kit 1  . calcium-vitamin D (OSCAL WITH D) 500-200 MG-UNIT tablet Take 1 tablet by mouth 2 (two) times daily.    . cetaphil (CETAPHIL) lotion Apply 1 application topically as needed for dry skin. 236 mL 0  . donepezil (ARICEPT) 10 MG tablet Take 1 tablet (10 mg total) by mouth at bedtime. 90 tablet 4  . Dulaglutide (TRULICITY) 3 MG/0.5ML SOPN Inject 3 mg into the skin once a week. 4 pen 2  . Elastic Bandages & Supports (MEDICAL COMPRESSION STOCKINGS) MISC Wear as much as possible while awake to reduce swelling 2 each 0  . empagliflozin (JARDIANCE) 10 MG TABS tablet Take 1 tablet (10 mg total) by mouth daily. 30 tablet 2  . FERROCITE 324 MG TABS tablet Take 1 tablet by mouth twice daily 60 tablet 5  . gabapentin (NEURONTIN)  100 MG capsule Take 1 capsule (100 mg total) by mouth 3 (three) times daily. 90 capsule 2  . glucose blood (ONETOUCH VERIO) test strip Use as instructed 4 times daily. E11.29, insulin requiring 350 each 3  . lactulose (CHRONULAC) 10 GM/15ML solution TAKE 15 ML BY MOUTH TWO TIMES DAILY 473 mL 2  . levETIRAcetam (KEPPRA) 500 MG tablet Take 1 tablet (500 mg total) by mouth 2 (two) times daily. 180 tablet 4  . memantine (NAMENDA) 10 MG tablet Take 1 tablet (10 mg total) by mouth 2 (two) times daily. 180  tablet 4  . ONETOUCH DELICA LANCETS FINE MISC Check blood sugar up to 4 times a day 300 each 4  . oxycodone-acetaminophen (PERCOCET) 2.5-325 MG tablet Take 1 tablet by mouth at bedtime as needed for pain. 30 tablet 0  . oxymetazoline (AFRIN) 0.05 % nasal spray Place 1 spray into both nostrils 2 (two) times daily as needed (Epistaxis). 30 mL 0  . pantoprazole (PROTONIX) 40 MG tablet Take 1 tablet by mouth once daily 180 tablet 0  . potassium chloride 20 MEQ/15ML (10%) SOLN Take 30 mLs (40 mEq total) by mouth 2 (two) times daily. Take 30 mL two times daily for 2 days, followed by 20 mls BID thereafter. 3800 mL 0  . sodium chloride (OCEAN) 0.65 % SOLN nasal spray Place 1 spray into both nostrils as needed for congestion. 60 mL 0  . torsemide (DEMADEX) 20 MG tablet Take 1 tablet (20 mg total) by mouth daily. 30 tablet 1  . TRESIBA FLEXTOUCH 100 UNIT/ML FlexTouch Pen INJECT 21 UNITS SUBCUTANEOUSLY IN THE EVENING 15 mL 0  . triamcinolone ointment (KENALOG) 0.5 % APPLY 1 APPLICATION TOPICALLY TO RASH TWICE DAILY FOR ITCHING (Patient taking differently: Apply 1 application topically 2 (two) times daily as needed (itching). ) 15 g 1   No current facility-administered medications for this visit.   Facility-Administered Medications Ordered in Other Visits  Medication Dose Route Frequency Provider Last Rate Last Admin  . sodium chloride 0.9 % injection 10 mL  10 mL Intracatheter PRN Truitt Merle, MD      . sodium chloride flush (NS) 0.9 % injection 10 mL  10 mL Intracatheter PRN Truitt Merle, MD   10 mL at 08/18/19 1717    PHYSICAL EXAMINATION: ECOG PERFORMANCE STATUS: 2 - Symptomatic, <50% confined to bed  Vitals:   10/13/19 1335  BP: (!) 144/57  Pulse: 60  Resp: 16  Temp: (!) 97.3 F (36.3 C)  SpO2: 96%   Filed Weights   10/13/19 1335  Weight: 133 lb 3.2 oz (60.4 kg)    Due to COVID19 we will limit examination to appearance. Patient had no complaints.  GENERAL:alert, no distress and  comfortable SKIN: skin color normal, no rashes (+) B/l LE skin lesions resolving  EYES: normal, Conjunctiva are pink and non-injected, sclera clear  NEURO: alert & oriented x 3 with fluent speech   LABORATORY DATA:  I have reviewed the data as listed CBC Latest Ref Rng & Units 10/13/2019 09/20/2019 09/15/2019  WBC 4.0 - 10.5 K/uL 6.9 7.5 7.2  Hemoglobin 12.0 - 15.0 g/dL 14.0 12.6 13.5  Hematocrit 36 - 46 % 42.9 39.1 41.5  Platelets 150 - 400 K/uL 221 208 181     CMP Latest Ref Rng & Units 09/21/2019 09/20/2019 09/15/2019  Glucose 70 - 99 mg/dL 153(H) 77 128(H)  BUN 8 - 23 mg/dL $Remove'21 16 13  'SXrGSbH$ Creatinine 0.44 - 1.00 mg/dL 1.13(H) 1.17(H) 1.19(H)  Sodium  135 - 145 mmol/L 141 141 145  Potassium 3.5 - 5.1 mmol/L 3.2(L) 2.7(LL) 2.7(LL)  Chloride 98 - 111 mmol/L 101 98 101  CO2 22 - 32 mmol/L 28 32 34(H)  Calcium 8.9 - 10.3 mg/dL 9.0 9.0 9.5  Total Protein 6.5 - 8.1 g/dL - - 6.6  Total Bilirubin 0.3 - 1.2 mg/dL - - 0.7  Alkaline Phos 38 - 126 U/L - - 271(H)  AST 15 - 41 U/L - - 30  ALT 0 - 44 U/L - - 16      RADIOGRAPHIC STUDIES: I have personally reviewed the radiological images as listed and agreed with the findings in the report. No results found.   ASSESSMENT & PLAN:  [name] is a 79 y.o. female with    1.Hereditary hemorrhagic telangiectasia(HHT)), withepistaxis and frequent GI bleeding -Given mild epistaxis, and frequent GI bleeding form HHT, she receives blood transfusions as needed. She used torequire blood transfusion every 3 to 4 weeks. Her last blood transfusion was on 04/13/2018 -She is on IV Feraheme as needed for Ferritin<100. Her last IV Feraheme was 08/18/19. -She is also being treated with Avastin since 04/2018. Has intermittently been held due to high proteinuria. Will continue Avastin as needed. -She is clinically doing well. Labs reviewed, anemia has resolved. Based on proteinuria level, will give Avastin today -Due to her proteinuria, I will give bevacizumab as  needed  -Continue monthly labs with and Avastin as needed and IVFeraheme if ferritin<100. Continue oral iron. -F/u in 16 weeks.   2. 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 Last given 04/13/18.  -We will continue CBC frequently, and give blood transfusion if hemoglobin less than7.0 (due to shortage of blood from the COVID 19 outbreak) -Best controlled when on Avastin.   3.Iron deficiency anemia secondary to #2  -She does not tolerate oral ironwell but is on it -Will continueIV Feraheme if ferritin less than 100.Last IV Feraheme7/9/21.   4.Dementia -She takes Ariceptand memantine. On Keppra for seizures.  -f/u with neurology.  -She reports her husband has been in the hospital recently.   5. DM, CHF, CKD stage III and HTN secondary to Avastin  -Continue to Dr Dwyane Dee -On 01/02/19 I started her on$RemoveBefor'5mg'hhNSLXDbbzIj$  amlodipinewith mild improvement. -continue monitoring  6. Worsening proteinuria -Likely related to Avastin, she has diabetes, which can cause proteinuria also -Will only given Avastin as needed for now.  -Continue to f/u with Dr. Dwyane Dee  7. Diffuse Skin lesions of B/l legs  -Seen on 06/23/10 exam. Per pt she has had this for some time (months) and seen dermatologist before.  -I have recommended she f/u with Dermatologist, however her lesions are resolving on exam today. She has been using Vaseline.   Plan -Labs reviewed, no need for Feraheme or blood transfusion today. Give Avastin today since her urine protein is low   -Lab every 4 weeks X4, will set up iv feraheme if ferritin<100 -will give bevacizumab if hemoglobin starts dropping, and urine protein 100 or less  -F/u in 16 weeks   No problem-specific Assessment & Plan notes found for this encounter.   No orders of the defined types were placed in this encounter.  All questions were answered. The patient knows to call the clinic with any  problems, questions or concerns. No barriers to learning was detected. The total time spent in the appointment was 25 minutes.     Truitt Merle, MD 10/13/2019  Oneal Deputy, am acting as scribe for Truitt Merle, MD.   I have reviewed the above documentation for accuracy and completeness, and I agree with the above.

## 2019-10-13 ENCOUNTER — Inpatient Hospital Stay: Payer: Medicare Other

## 2019-10-13 ENCOUNTER — Inpatient Hospital Stay: Payer: Medicare Other | Attending: Hematology

## 2019-10-13 ENCOUNTER — Inpatient Hospital Stay (HOSPITAL_BASED_OUTPATIENT_CLINIC_OR_DEPARTMENT_OTHER): Payer: Medicare Other | Admitting: Hematology

## 2019-10-13 ENCOUNTER — Other Ambulatory Visit: Payer: Self-pay

## 2019-10-13 VITALS — BP 139/49 | HR 53

## 2019-10-13 VITALS — BP 144/57 | HR 60 | Temp 97.3°F | Resp 16 | Ht 63.0 in | Wt 133.2 lb

## 2019-10-13 DIAGNOSIS — Q2733 Arteriovenous malformation of digestive system vessel: Secondary | ICD-10-CM | POA: Insufficient documentation

## 2019-10-13 DIAGNOSIS — L989 Disorder of the skin and subcutaneous tissue, unspecified: Secondary | ICD-10-CM | POA: Insufficient documentation

## 2019-10-13 DIAGNOSIS — D500 Iron deficiency anemia secondary to blood loss (chronic): Secondary | ICD-10-CM | POA: Insufficient documentation

## 2019-10-13 DIAGNOSIS — I780 Hereditary hemorrhagic telangiectasia: Secondary | ICD-10-CM

## 2019-10-13 DIAGNOSIS — E785 Hyperlipidemia, unspecified: Secondary | ICD-10-CM | POA: Diagnosis not present

## 2019-10-13 DIAGNOSIS — K219 Gastro-esophageal reflux disease without esophagitis: Secondary | ICD-10-CM | POA: Diagnosis not present

## 2019-10-13 DIAGNOSIS — F0390 Unspecified dementia without behavioral disturbance: Secondary | ICD-10-CM | POA: Insufficient documentation

## 2019-10-13 DIAGNOSIS — Z794 Long term (current) use of insulin: Secondary | ICD-10-CM | POA: Insufficient documentation

## 2019-10-13 DIAGNOSIS — I272 Pulmonary hypertension, unspecified: Secondary | ICD-10-CM | POA: Insufficient documentation

## 2019-10-13 DIAGNOSIS — I130 Hypertensive heart and chronic kidney disease with heart failure and stage 1 through stage 4 chronic kidney disease, or unspecified chronic kidney disease: Secondary | ICD-10-CM | POA: Diagnosis not present

## 2019-10-13 DIAGNOSIS — E1122 Type 2 diabetes mellitus with diabetic chronic kidney disease: Secondary | ICD-10-CM | POA: Diagnosis not present

## 2019-10-13 DIAGNOSIS — Z5112 Encounter for antineoplastic immunotherapy: Secondary | ICD-10-CM | POA: Diagnosis not present

## 2019-10-13 DIAGNOSIS — Z79899 Other long term (current) drug therapy: Secondary | ICD-10-CM | POA: Insufficient documentation

## 2019-10-13 DIAGNOSIS — N183 Chronic kidney disease, stage 3 unspecified: Secondary | ICD-10-CM | POA: Diagnosis not present

## 2019-10-13 DIAGNOSIS — K31819 Angiodysplasia of stomach and duodenum without bleeding: Secondary | ICD-10-CM

## 2019-10-13 DIAGNOSIS — I5032 Chronic diastolic (congestive) heart failure: Secondary | ICD-10-CM | POA: Insufficient documentation

## 2019-10-13 DIAGNOSIS — Z95828 Presence of other vascular implants and grafts: Secondary | ICD-10-CM

## 2019-10-13 LAB — CBC WITH DIFFERENTIAL (CANCER CENTER ONLY)
Abs Immature Granulocytes: 0.01 10*3/uL (ref 0.00–0.07)
Basophils Absolute: 0.1 10*3/uL (ref 0.0–0.1)
Basophils Relative: 1 %
Eosinophils Absolute: 0.4 10*3/uL (ref 0.0–0.5)
Eosinophils Relative: 6 %
HCT: 42.9 % (ref 36.0–46.0)
Hemoglobin: 14 g/dL (ref 12.0–15.0)
Immature Granulocytes: 0 %
Lymphocytes Relative: 16 %
Lymphs Abs: 1.1 10*3/uL (ref 0.7–4.0)
MCH: 27.6 pg (ref 26.0–34.0)
MCHC: 32.6 g/dL (ref 30.0–36.0)
MCV: 84.6 fL (ref 80.0–100.0)
Monocytes Absolute: 0.7 10*3/uL (ref 0.1–1.0)
Monocytes Relative: 10 %
Neutro Abs: 4.7 10*3/uL (ref 1.7–7.7)
Neutrophils Relative %: 67 %
Platelet Count: 221 10*3/uL (ref 150–400)
RBC: 5.07 MIL/uL (ref 3.87–5.11)
RDW: 15.9 % — ABNORMAL HIGH (ref 11.5–15.5)
WBC Count: 6.9 10*3/uL (ref 4.0–10.5)
nRBC: 0 % (ref 0.0–0.2)

## 2019-10-13 LAB — TOTAL PROTEIN, URINE DIPSTICK: Protein, ur: 30 mg/dL — AB

## 2019-10-13 MED ORDER — HEPARIN SOD (PORK) LOCK FLUSH 100 UNIT/ML IV SOLN
500.0000 [IU] | Freq: Once | INTRAVENOUS | Status: AC | PRN
  Administered 2019-10-13: 500 [IU]
  Filled 2019-10-13: qty 5

## 2019-10-13 MED ORDER — SODIUM CHLORIDE 0.9% FLUSH
10.0000 mL | INTRAVENOUS | Status: DC | PRN
  Administered 2019-10-13: 10 mL
  Filled 2019-10-13: qty 10

## 2019-10-13 MED ORDER — SODIUM CHLORIDE 0.9 % IV SOLN
Freq: Once | INTRAVENOUS | Status: AC
  Filled 2019-10-13: qty 250

## 2019-10-13 MED ORDER — SODIUM CHLORIDE 0.9 % IV SOLN
4.6000 mg/kg | Freq: Once | INTRAVENOUS | Status: AC
  Administered 2019-10-13: 300 mg via INTRAVENOUS
  Filled 2019-10-13: qty 12

## 2019-10-13 NOTE — Patient Instructions (Signed)
[facility] Discharge Instructions for Patients Receiving Chemotherapy  Today you received the following chemotherapy agents Bevacizumab  To help prevent nausea and vomiting after your treatment, we encourage you to take your nausea medication as directed   If you develop nausea and vomiting that is not controlled by your nausea medication, call the clinic.   BELOW ARE SYMPTOMS THAT SHOULD BE REPORTED IMMEDIATELY:  *FEVER GREATER THAN 100.5 F  *CHILLS WITH OR WITHOUT FEVER  NAUSEA AND VOMITING THAT IS NOT CONTROLLED WITH YOUR NAUSEA MEDICATION  *UNUSUAL SHORTNESS OF BREATH  *UNUSUAL BRUISING OR BLEEDING  TENDERNESS IN MOUTH AND THROAT WITH OR WITHOUT PRESENCE OF ULCERS  *URINARY PROBLEMS  *BOWEL PROBLEMS  UNUSUAL RASH Items with * indicate a potential emergency and should be followed up as soon as possible.  Feel free to call the clinic should you have any questions or concerns. The clinic phone number is [phone].  Please show the CHEMO ALERT CARD at check-in to the Emergency Department and triage nurse.   

## 2019-10-14 ENCOUNTER — Encounter: Payer: Self-pay | Admitting: Hematology

## 2019-10-17 ENCOUNTER — Telehealth: Payer: Medicare Other

## 2019-10-17 ENCOUNTER — Telehealth: Payer: Self-pay | Admitting: *Deleted

## 2019-10-17 ENCOUNTER — Ambulatory Visit: Payer: Medicare Other | Admitting: Dermatology

## 2019-10-17 ENCOUNTER — Telehealth: Payer: Self-pay | Admitting: Hematology

## 2019-10-17 LAB — FERRITIN: Ferritin: 103 ng/mL (ref 11–307)

## 2019-10-17 NOTE — Telephone Encounter (Signed)
  Chronic Care Management   Outreach Note  10/17/2019 Name: [name] MRN: [id number] DOB: [birth year]  Referred by: Delice Bison, DO Reason for referral : Chronic Care Management (IDDM, HF, PAF, HTN, CKD, MCI, seizures)   Third unsuccessful telephone outreach was attempted today. Unable to leave message on niece's cell phone or patient's home number as recording states voice mail full at both numbers. The patient was referred to the case management team for assistance with care management and care coordination.   Follow Up Plan: Will ask CCM BSW to ask patient and/or niece to contact  this CCM RN if she successfully reaches either patient or niece.  Kelli Churn RN, CCM, [facility] RN Care Manager [phone]

## 2019-10-17 NOTE — Addendum Note (Signed)
Addended by: Hulan Fray on: 10/17/2019 03:24 PM   Modules accepted: Orders

## 2019-10-17 NOTE — Telephone Encounter (Signed)
Scheduled appointments per 9/3 los. Patient is aware of all appointments dates and times.

## 2019-10-19 ENCOUNTER — Telehealth: Payer: Self-pay | Admitting: Internal Medicine

## 2019-10-19 NOTE — Telephone Encounter (Signed)
   SF 10/19/2019   Name: [name]   MRN: [id number]   DOB: 1940/08/09   AGE: 79 y.o.   GENDER: female   PCP [place], Carley D, DO.   Called patient's niece [name] and patient's home phone regarding referral for dental resources.  Niece did not answer; could not leave a message due to voicemail box being full; patient answered the phone but could not hear. Will try to contact patient's niece again at a later time.   Follow up on: 10/20/2019  [place], Care Management Phone: [phone] Email: [email or web address]

## 2019-10-20 ENCOUNTER — Telehealth: Payer: Medicare Other

## 2019-10-20 ENCOUNTER — Telehealth: Payer: Self-pay

## 2019-10-20 NOTE — Telephone Encounter (Signed)
  Chronic Care Management   Outreach Note  10/20/2019 Name: [name] MRN: [id number] DOB: 1940/03/24  Referred by: Delice Bison, DO Reason for referral : No chief complaint on file.   An unsuccessful telephone outreach was attempted today. The patient was referred to the case management team for assistance with care management and care coordination.   Follow Up Plan: Unable to leave message on patient or niece's phones due to mailboxes being full.       [name], [place] Coordination Social Worker [place] [phone]

## 2019-10-20 NOTE — Telephone Encounter (Signed)
Received TC from nurse [name] with [facility].  States she would like to continue seeing patient for [facility]  1X/week for 5 weeks for CHF, DM education Also requesting Social worker consult. VO given.  Will forward to [facility] team for agreement or denial. Thank you, SChaplin, RN,BSN

## 2019-10-20 NOTE — Telephone Encounter (Signed)
Agree 

## 2019-10-24 NOTE — Telephone Encounter (Signed)
   SF 10/24/2019   Name: [name]   MRN: [id number]   DOB: 11-18-1940   AGE: 79 y.o.   GENDER: female   PCP [place], Carley D, DO.   Attempted to call patient's niece [name] regarding referral for dental resources. Left message for [name] to give office a call back. Will try to call patient again at a later time.  Follow up on: 10/25/2019  [place], Care Management Phone: [phone] Email: [email or web address]

## 2019-10-25 NOTE — Telephone Encounter (Signed)
   SF 10/25/2019 3rd Attempt  Name: [name]   MRN: [id number]   DOB: [birth year]   AGE: 79 y.o.   GENDER: female   PCP [place], Carley D, DO.   Called pt regarding Data processing manager Referral for dental resources. Called patient's niece Irving Burton to see if her aunt [name] is still in need of dental resources. Left message to give Care Guide a Call back. Referral will be closed for unable to contact.   Closing referral pending any other needs of patient.     [place], Care Management Phone: [phone] Email: [email or web address]

## 2019-10-27 ENCOUNTER — Ambulatory Visit: Payer: Medicare Other

## 2019-10-27 ENCOUNTER — Other Ambulatory Visit: Payer: Self-pay | Admitting: Internal Medicine

## 2019-10-27 ENCOUNTER — Telehealth: Payer: Self-pay

## 2019-10-27 DIAGNOSIS — I5032 Chronic diastolic (congestive) heart failure: Secondary | ICD-10-CM

## 2019-10-27 DIAGNOSIS — I10 Essential (primary) hypertension: Secondary | ICD-10-CM

## 2019-10-27 DIAGNOSIS — R413 Other amnesia: Secondary | ICD-10-CM

## 2019-10-27 NOTE — Telephone Encounter (Signed)
  Chronic Care Management   Outreach Note  10/27/2019 Name: [name] MRN: [id number] DOB: 27-Jun-1940  Referred by: Delice Bison, DO Reason for referral : Care Coordination [facility])   An unsuccessful telephone outreach was attempted today. The patient was referred to the case management team for assistance with care management and care coordination.   Follow Up Plan:Unable to leave message at patient's home number due to mailbox being full.  Left message on niece's mobile number requesting return call.   Ronn Melena, [place] Coordination Social Worker [place] [phone]

## 2019-10-27 NOTE — Patient Instructions (Signed)
Visit Information  Goals Addressed              This Visit's Progress   .  "Do wives of veterans have benefits for home care assistance?" (pt-stated)        CARE PLAN ENTRY (see longitudinal plan of care for additional care plan information)  Current Barriers:  . 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 Patient says she thinks she's had home health services in the past but does not remember the name of the agency and she did not voice an agency preference. Patient says she would welcome any help in the home she can get as her husband is not doing well and their nephew took him to the [place] clinic today, patient says her husband can no longer walk and has not been eating. She says she depends on her husband for most of her IADL"s an most ADLs. She says her niece [name] has been helping out with administering her medications and she says her sister is with her today while her husband is at the [place]. Patient gave permission for health team to discuss care with her niece [name]. She says [name] drives a school bus and is available via cell phone, # [phone].    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 Work with the care management team to address educational, disease management, and care coordination needs  . Begin or continue self health monitoring activities as directed today Measure and record cbg (blood glucose)  2-3 times daily, Measure and record blood pressure 1-2 times per week, and Measure and record weight daily . 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 Call care management team with questions or concerns . 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 Contacted niece regarding status of request for PCS; Alvis Lemmings will provide service and intake has been completed . Provided niece with contact information for Ludger Nutting, Director of [facility], as she inquired about additional hours for patient as well as in-home services for patient's spouse . 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 Patient is unable to independently self-manage chronic health conditions  Please see past updates related to this goal by clicking on the "Past Updates" button in the selected goal      .  Per niece, "We need help with transportation to medical appointments due to my work schedule"        CARE PLAN ENTRY (see longitudinal plan  of care for additional care plan information)  Current Barriers:  . Niece is currently assisting patient and patient's spouse with transportation to medical appointments.  This is becoming increasingly difficult due to her work schedule.  Clinical Social Work Clinical Goal(s):  Marland Kitchen Over the next 30 days, patient will work with SW to address concerns related to transportation   Interventions: . Inter-disciplinary care team collaboration (see  longitudinal plan of care) . Informed niece that patient should be eligible for Medicaid transportation but an assessment must be completed if patient has never used the service. . Provided her with contact information for Medicaid transportation  . Informed niece that patient may also have transportation benefit through [employer]  . Encouraged her to contact [place] to verfiy . Provided niece with date, time, location of upcoming appointments for patient.   Patient Self Care Activities:   Patient is unable to independently self-manage chronic health conditions .   Marland Kitchen Unable to perform ADLs independently . Unable to perform IADLs independently  Initial goal documentation        Niece verbalizes understanding of instructions provided today.   The patient's niece has been provided with contact information for the care management team and has been advised to call with any health/community resource related questions or concerns.     Ronn Melena, [place] Coordination Social Worker [place] [phone]

## 2019-10-27 NOTE — Progress Notes (Signed)
Internal Medicine Clinic Resident  I have personally reviewed this encounter including the documentation in this note and/or discussed this patient with the care management provider. I will address any urgent items identified by the care management provider and will communicate my actions to the patient's PCP. I have reviewed the patient's CCM visit with my supervising attending, Dr Hoffman.  Matt Mckinleigh Schuchart, MD 10/27/2019   

## 2019-10-27 NOTE — Chronic Care Management (AMB) (Signed)
Care Management   Follow Up Note   10/27/2019 Name: [name] MRN: [id number] DOB: 1940-09-03  Referred by: Delice Bison, DO Reason for referral : Care Coordination (PCS, transportation )   [name] is a 79 y.o. year old female who is a primary care patient of Delice Bison, DO. The care management team was consulted for assistance with care management and care coordination needs.    Review of patient status, including review of consultants reports, relevant laboratory and other test results, and collaboration with appropriate care team members and the patient's provider was performed as part of comprehensive patient evaluation and provision of chronic care management services.    SDOH (Social Determinants of Health) assessments performed: No See Care Plan activities for detailed interventions related to [facility])     Advanced Directives: See Care Plan and Vynca application for related entries.   Goals Addressed              This Visit's Progress   .  "Do wives of veterans have benefits for home care assistance?" (pt-stated)        CARE PLAN ENTRY (see longitudinal plan of care for additional care plan information)  Current Barriers:  . Chronic Disease Management support, education, and care coordination needs related to Atrial Fibrillation, CHF, HLD, DMII, CKD Stage 3, and 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- was not able to meet patient and her husband during 09/20/19 clinic visit to discuss [place] [facility] because she was a "no show" for the appointment, she was seen in the ED on 8/11 after having a seizure and then in the clinic on 8/12 and an order for home health was written. Patient says she thinks she's had home health services in the past but does not remember the name of the agency and she did not voice an agency preference. Patient says she would welcome any help in the home she can get as her husband is not doing  well and their nephew took him to the [place] clinic today, patient says her husband can no longer walk and has not been eating. She says she depends on her husband for most of her IADL"s an most ADLs. She says her niece [name] has been helping out with administering her medications and she says her sister is with her today while her husband is at the [place]. Patient gave permission for health team to discuss care with her niece [name]. She says [name] drives a school bus and is available via cell phone, # [phone].    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 Work with the care management team to address educational, disease management, and care coordination needs  . Begin or continue self health monitoring activities as directed today Measure and record cbg (blood glucose) 2-3 times daily, Measure and record blood pressure 1-2 times per week, and Measure and record weight daily . 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 Call care management team with questions or concerns . 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 Contacted niece regarding status of request for PCS;  Alvis Lemmings will provide service and intake has been completed . Provided niece with contact information for Ludger Nutting, Director of [facility], as she inquired about additional hours for patient as well as in-home services for patient's spouse . 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 Patient is unable to independently self-manage chronic health conditions  Please see past updates related to this goal by clicking on the "Past Updates" button in the selected goal      .  Per niece, "We need help with transportation to medical appointments due to my work schedule"        CARE PLAN ENTRY (see longitudinal plan of care for additional care plan information)  Current Barriers:  . Niece is currently assisting patient and patient's spouse with transportation to medical appointments.  This is becoming increasingly difficult due to her work schedule.  Clinical Social Work Clinical Goal(s):  Marland Kitchen Over the next 30 days, patient will work with SW to address concerns related to transportation   Interventions: . Inter-disciplinary care team collaboration (see longitudinal plan of care) . Informed niece that patient should be eligible for Medicaid transportation but an assessment must be completed if patient has never used the service. . Provided her with contact information for Medicaid transportation  . Informed niece that patient may also have transportation benefit through [employer]  . Encouraged her to contact [place] to verfiy . Provided niece with date, time, location of upcoming appointments for patient.   Patient Self Care Activities:   Patient is unable to independently self-manage chronic health conditions .   Marland Kitchen Unable to perform ADLs independently . Unable to perform IADLs independently  Initial goal documentation         The patient's niece has been provided with contact information for the care management team and has been advised to call with any health/community related questions or concerns.     Ronn Melena, [place] Coordination Social Worker [place] [phone]

## 2019-10-31 ENCOUNTER — Other Ambulatory Visit: Payer: Self-pay

## 2019-10-31 ENCOUNTER — Other Ambulatory Visit (INDEPENDENT_AMBULATORY_CARE_PROVIDER_SITE_OTHER): Payer: Medicare Other

## 2019-10-31 DIAGNOSIS — Z794 Long term (current) use of insulin: Secondary | ICD-10-CM | POA: Diagnosis not present

## 2019-10-31 DIAGNOSIS — E1165 Type 2 diabetes mellitus with hyperglycemia: Secondary | ICD-10-CM

## 2019-10-31 LAB — BASIC METABOLIC PANEL
BUN: 20 mg/dL (ref 6–23)
CO2: 32 mEq/L (ref 19–32)
Calcium: 10.4 mg/dL (ref 8.4–10.5)
Chloride: 97 mEq/L (ref 96–112)
Creatinine, Ser: 1.54 mg/dL — ABNORMAL HIGH (ref 0.40–1.20)
GFR: 39.26 mL/min — ABNORMAL LOW (ref >60.00)
Glucose, Bld: 201 mg/dL — ABNORMAL HIGH (ref 70–99)
Potassium: 3.9 mEq/L (ref 3.5–5.1)
Sodium: 140 mEq/L (ref 135–145)

## 2019-10-31 LAB — HEMOGLOBIN A1C: Hgb A1c MFr Bld: 8.4 % — ABNORMAL HIGH (ref 4.6–6.5)

## 2019-11-01 LAB — FRUCTOSAMINE: Fructosamine: 404 umol/L — ABNORMAL HIGH (ref 0–285)

## 2019-11-02 ENCOUNTER — Ambulatory Visit: Payer: Medicare Other | Admitting: Endocrinology

## 2019-11-03 ENCOUNTER — Ambulatory Visit: Payer: Medicare Other | Admitting: Endocrinology

## 2019-11-04 ENCOUNTER — Telehealth: Payer: Self-pay | Admitting: Internal Medicine

## 2019-11-04 NOTE — Telephone Encounter (Signed)
Patient's daughter called the [facility] on call pager regarding hyperglycemia. She states that the patient has been constipated for the past couple of days and tried to drink prune juice to help her have a BM. As a result, she has had significantly elevated blood sugars ranging form 450-550. She currently takes Trulicity 3 mg weekly, Jardiance 10 mg daily, and Tresiba 21 units daily. She see Dr. Dwyane Dee (Endocrinology) for her diabetes and recently started taking Lispro 3 units? each evening. Patient admits to polyuria and polydipsia without altered mental status, neurologic or GI symptoms. Patient family called EMS , who was on site when I was talking to the patient's daughter. I spoke with the EMT who confirmed the patients history and symptoms. Apparently patient just recently took her Tyler Aas, but continued to have elevated CBGs. I counseled the patient regarding the risk of managing her elevated blood glucose in the outpatient setting and discussed the imporatance of going to the ED for further evaluation and management in a controlled setting. I instructed the EMT to administer 7 units of Lispro SQ and continue to monitor her blood sugar over the next hour. I encouraged the patient to continue to drink plenty of fluids.  Patient denies any fevers, SHOB, chest pain, sick contacts, other changes in medications.    Marianna Payment, D.O. [facility], PGY-2 Pager: [phone], Phone: [phone] Date 11/04/2019 Time 4:12 PM

## 2019-11-06 ENCOUNTER — Ambulatory Visit: Payer: Medicare Other

## 2019-11-06 ENCOUNTER — Other Ambulatory Visit: Payer: Self-pay | Admitting: Internal Medicine

## 2019-11-06 DIAGNOSIS — R413 Other amnesia: Secondary | ICD-10-CM

## 2019-11-06 DIAGNOSIS — I10 Essential (primary) hypertension: Secondary | ICD-10-CM

## 2019-11-06 DIAGNOSIS — I5032 Chronic diastolic (congestive) heart failure: Secondary | ICD-10-CM

## 2019-11-06 NOTE — Progress Notes (Addendum)
Internal Medicine Clinic Resident  I have personally reviewed this encounter including the documentation in this note and/or discussed this patient with the care management provider. I will address any urgent items identified by the care management provider and will communicate my actions to the patient's PCP. I have reviewed the patient's CCM visit with my supervising attending, Dr Jimmye Norman.  Jean Rosenthal, MD 11/06/2019     Internal Medicine Attending: I reviewed case and documentation and agree.

## 2019-11-06 NOTE — Chronic Care Management (AMB) (Signed)
Care Management   Follow Up Note   11/06/2019 Name: [name] MRN: [id number] DOB: 08-31-1940  Referred by: Delice Bison, DO Reason for referral : Care Coordination (PCS)   [name] is a 79 y.o. year old female who is a primary care patient of Delice Bison, DO. The care management team was consulted for assistance with care management and care coordination needs.    Review of patient status, including review of consultants reports, relevant laboratory and other test results, and collaboration with appropriate care team members and the patient's provider was performed as part of comprehensive patient evaluation and provision of chronic care management services.    SDOH (Social Determinants of Health) assessments performed: No See Care Plan activities for detailed interventions related to [facility])     Advanced Directives: See Care Plan and Vynca application for related entries.   Goals Addressed              This Visit's Progress   .  "Do wives of veterans have benefits for home care assistance?" (pt-stated)        CARE PLAN ENTRY (see longitudinal plan of care for additional care plan information)  Current Barriers:  . 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 Patient says she thinks she's had home health services in the past but does not remember the name of the agency and she did not voice an agency preference. Patient says she would welcome any help in the home she can get as her husband is not doing well and their  nephew took him to the [place] clinic today, patient says her husband can no longer walk and has not been eating. She says she depends on her husband for most of her IADL"s an most ADLs. She says her niece [name] has been helping out with administering her medications and she says her sister is with her today while her husband is at the [place]. Patient gave permission for health team to discuss care with her niece [name]. She says [name] drives a school bus and is available via cell phone, # [phone].    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 Work with the care management team to address educational, disease management, and care coordination needs  . Begin or continue self health monitoring activities as directed today Measure and record cbg (blood glucose) 2-3 times daily, Measure and record blood pressure 1-2 times per week, and Measure and record weight daily . 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 Call care management team with questions or concerns . 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 Provided niece with contact information for [employer] as she  states that [facility] cannot accommodate PCS . Informed niece that [name] will mail letter if next selected agency cannot accommodate request . Encouraged niece to contact [place] every couple of days as opposed to waiting for letter.   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 Patient is unable to independently  self-manage chronic health conditions  Please see past updates related to this goal by clicking on the "Past Updates" button in the selected goal          Telephone follow up appointment with care management team member scheduled for:11/20/19      Ronn Melena, [place] Coordination Social Worker [place] [phone]

## 2019-11-06 NOTE — Telephone Encounter (Signed)
Does not appear that we have been managing her DM since July 2020. Last labs were obtained by endocrinology so refill requests should be forwarded to them. Please send this one to them as well. Thanks!

## 2019-11-06 NOTE — Patient Instructions (Signed)
Visit Information  Goals Addressed              This Visit's Progress   .  "Do wives of veterans have benefits for home care assistance?" (pt-stated)        CARE PLAN ENTRY (see longitudinal plan of care for additional care plan information)  Current Barriers:  . 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 Patient says she thinks she's had home health services in the past but does not remember the name of the agency and she did not voice an agency preference. Patient says she would welcome any help in the home she can get as her husband is not doing well and their nephew took him to the [place] clinic today, patient says her husband can no longer walk and has not been eating. She says she depends on her husband for most of her IADL"s an most ADLs. She says her niece [name] has been helping out with administering her medications and she says her sister is with her today while her husband is at the [place]. Patient gave permission for health team to discuss care with her niece [name]. She says [name] drives a school bus and is available via cell phone, # [phone].    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 Work with the care management team to address educational, disease management, and care coordination needs  . Begin or continue self health monitoring activities as directed today Measure and record cbg (blood glucose)  2-3 times daily, Measure and record blood pressure 1-2 times per week, and Measure and record weight daily . 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 Call care management team with questions or concerns . 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 Provided niece with contact information for [employer] as she states that [place] cannot accommodate PCS . Informed niece that [place] will mail letter if next selected agency cannot accommodate request . Encouraged niece to contact [place] every couple of days as opposed to waiting for letter.   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 Patient is unable to independently self-manage chronic health conditions  Please see past updates related to this goal by clicking on the "Past Updates" button in the selected goal         Patient's niece verbalizes understanding of instructions provided today.   Telephone follow up appointment with care management team member scheduled for:11/20/19     Ronn Melena, [place] Coordination Social Worker [place] [phone]

## 2019-11-09 ENCOUNTER — Other Ambulatory Visit: Payer: Self-pay | Admitting: Endocrinology

## 2019-11-09 DIAGNOSIS — IMO0002 Reserved for concepts with insufficient information to code with codable children: Secondary | ICD-10-CM

## 2019-11-10 ENCOUNTER — Other Ambulatory Visit: Payer: Self-pay

## 2019-11-10 ENCOUNTER — Inpatient Hospital Stay: Payer: Medicare Other

## 2019-11-10 ENCOUNTER — Inpatient Hospital Stay: Payer: Medicare Other | Attending: Hematology

## 2019-11-10 DIAGNOSIS — D500 Iron deficiency anemia secondary to blood loss (chronic): Secondary | ICD-10-CM | POA: Diagnosis not present

## 2019-11-10 DIAGNOSIS — I780 Hereditary hemorrhagic telangiectasia: Secondary | ICD-10-CM | POA: Insufficient documentation

## 2019-11-10 DIAGNOSIS — Z452 Encounter for adjustment and management of vascular access device: Secondary | ICD-10-CM | POA: Diagnosis not present

## 2019-11-10 DIAGNOSIS — K31819 Angiodysplasia of stomach and duodenum without bleeding: Secondary | ICD-10-CM

## 2019-11-10 DIAGNOSIS — Z95828 Presence of other vascular implants and grafts: Secondary | ICD-10-CM

## 2019-11-10 LAB — CMP (CANCER CENTER ONLY)
ALT: 29 U/L (ref 0–44)
AST: 30 U/L (ref 15–41)
Albumin: 3.1 g/dL — ABNORMAL LOW (ref 3.5–5.0)
Alkaline Phosphatase: 212 U/L — ABNORMAL HIGH (ref 38–126)
Anion gap: 9 (ref 5–15)
BUN: 18 mg/dL (ref 8–23)
CO2: 29 mmol/L (ref 22–32)
Calcium: 9.6 mg/dL (ref 8.9–10.3)
Chloride: 99 mmol/L (ref 98–111)
Creatinine: 1.69 mg/dL — ABNORMAL HIGH (ref 0.44–1.00)
GFR, Est AFR Am: 33 mL/min — ABNORMAL LOW (ref >60)
GFR, Estimated: 28 mL/min — ABNORMAL LOW (ref >60)
Glucose, Bld: 315 mg/dL — ABNORMAL HIGH (ref 70–99)
Potassium: 3.5 mmol/L (ref 3.5–5.1)
Sodium: 137 mmol/L (ref 135–145)
Total Bilirubin: 0.9 mg/dL (ref 0.3–1.2)
Total Protein: 7.1 g/dL (ref 6.5–8.1)

## 2019-11-10 LAB — CBC WITH DIFFERENTIAL (CANCER CENTER ONLY)
Abs Immature Granulocytes: 0.02 10*3/uL (ref 0.00–0.07)
Basophils Absolute: 0 10*3/uL (ref 0.0–0.1)
Basophils Relative: 1 %
Eosinophils Absolute: 0.3 10*3/uL (ref 0.0–0.5)
Eosinophils Relative: 4 %
HCT: 45.5 % (ref 36.0–46.0)
Hemoglobin: 15.4 g/dL — ABNORMAL HIGH (ref 12.0–15.0)
Immature Granulocytes: 0 %
Lymphocytes Relative: 15 %
Lymphs Abs: 1 10*3/uL (ref 0.7–4.0)
MCH: 28.9 pg (ref 26.0–34.0)
MCHC: 33.8 g/dL (ref 30.0–36.0)
MCV: 85.5 fL (ref 80.0–100.0)
Monocytes Absolute: 0.7 10*3/uL (ref 0.1–1.0)
Monocytes Relative: 11 %
Neutro Abs: 4.4 10*3/uL (ref 1.7–7.7)
Neutrophils Relative %: 69 %
Platelet Count: 205 10*3/uL (ref 150–400)
RBC: 5.32 MIL/uL — ABNORMAL HIGH (ref 3.87–5.11)
RDW: 14.5 % (ref 11.5–15.5)
WBC Count: 6.5 10*3/uL (ref 4.0–10.5)
nRBC: 0 % (ref 0.0–0.2)

## 2019-11-10 LAB — TOTAL PROTEIN, URINE DIPSTICK: Protein, ur: 100 mg/dL — AB

## 2019-11-10 LAB — FERRITIN: Ferritin: 102 ng/mL (ref 11–307)

## 2019-11-10 MED ORDER — SODIUM CHLORIDE 0.9% FLUSH
10.0000 mL | INTRAVENOUS | Status: DC | PRN
  Administered 2019-11-10: 10 mL
  Filled 2019-11-10: qty 10

## 2019-11-10 MED ORDER — HEPARIN SOD (PORK) LOCK FLUSH 100 UNIT/ML IV SOLN
500.0000 [IU] | Freq: Once | INTRAVENOUS | Status: AC | PRN
  Administered 2019-11-10: 500 [IU]
  Filled 2019-11-10: qty 5

## 2019-11-16 ENCOUNTER — Ambulatory Visit: Payer: Medicare Other | Admitting: Dietician

## 2019-11-20 ENCOUNTER — Telehealth: Payer: Medicare Other

## 2019-11-20 ENCOUNTER — Telehealth: Payer: Self-pay

## 2019-11-20 NOTE — Telephone Encounter (Signed)
  Chronic Care Management   Outreach Note  11/20/2019 Name: [name] MRN: [id number] DOB: [birth year]  Referred by: Delice Bison, DO Reason for referral : Care Coordination [facility])   An unsuccessful telephone outreach was attempted today. The patient was referred to the case management team for assistance with care management and care coordination.   Follow Up Plan: A HIPAA compliant phone message was left for the patient's niece providing contact information and requesting a return call.     Ronn Melena, [place] Coordination Social Worker [place] [phone]

## 2019-11-21 ENCOUNTER — Ambulatory Visit (INDEPENDENT_AMBULATORY_CARE_PROVIDER_SITE_OTHER): Payer: Medicare Other | Admitting: Endocrinology

## 2019-11-21 ENCOUNTER — Other Ambulatory Visit: Payer: Self-pay

## 2019-11-21 ENCOUNTER — Encounter: Payer: Self-pay | Admitting: Endocrinology

## 2019-11-21 VITALS — BP 122/70 | HR 61 | Wt 139.0 lb

## 2019-11-21 DIAGNOSIS — E1129 Type 2 diabetes mellitus with other diabetic kidney complication: Secondary | ICD-10-CM

## 2019-11-21 DIAGNOSIS — E1165 Type 2 diabetes mellitus with hyperglycemia: Secondary | ICD-10-CM

## 2019-11-21 DIAGNOSIS — N289 Disorder of kidney and ureter, unspecified: Secondary | ICD-10-CM | POA: Diagnosis not present

## 2019-11-21 DIAGNOSIS — IMO0002 Reserved for concepts with insufficient information to code with codable children: Secondary | ICD-10-CM

## 2019-11-21 LAB — GLUCOSE, POCT (MANUAL RESULT ENTRY): POC Glucose: 258 mg/dl — AB (ref 70–99)

## 2019-11-21 NOTE — Progress Notes (Signed)
Patient ID: [name], female   DOB: 1940/06/13, 79 y.o.   MRN: [id number]           Reason for Appointment: Follow-up for Type 2 Diabetes  Referring PCP: Koleen Distance   History of Present Illness:          Date of diagnosis of type 2 diabetes mellitus: 1988       Background history:   Patient has been on insulin for a few years and she is unclear when this was started, according to Epic she was started on Levemir in 2015 Prior to that likely had been on various oral hypoglycemic agents including metformin, glipizide and Tradjenta Her level of control has been somewhat variable but not consistently poor; her A1c has ranged between 5.6 and 7.6  Recent history:   Her A1c is up to 8.4% compared to 6%  The previous fructosamine 256  INSULIN regimen is:  [place] 8 units in the mornings daily, Humalog 3 units hs    Non-insulin hypoglycemic drugs prescribed: Trulicity 3 mg weekly  Current management, blood sugar patterns and problems identified:  She apparently is not able to manage her diabetes on her own well without any help from the family  Despite clear-cut and repeated instructions on her previous visit about 2 months ago she is only taking Humalog at bedtime based on her blood sugar level  She did not bring her monitor for download even though she was reminded to do that on each visit  Blood sugars are not improving with starting back on Trulicity, again not clear if she is taking 2 shots of the 1.5, she had been prescribed 3 mg on the last visit  She was also started on Jardiance in July but recently insurance is denying this  Today her blood sugar in the office is 283  She says her blood sugars are mostly 200-250 range at any given time and occasionally up to 375  Only rarely her blood sugar may be below 150  No HYPOGLYCEMIA now  She is generally eating a sandwich at lunch and some carbohydrates at dinner but except for today only eating eggs and sausage in the  morning        Side effects from medications have been: None known     Meal times are:  Breakfast is at variable times between 8 AM-9  Dinner 7 pm  Typical meal intake: Breakfast is Cheerios or raisin Bran               Exercise:  Unable to do any  Glucose monitoring:  Checking up to 4 times a day       Glucometer: One Probation officer.       Blood Glucose readings by recall:   PRE-MEAL Fasting Lunch Dinner Bedtime Overall  Glucose range: 200-250   250   Mean/median:          Dietician visit, most recent: 11/2018  Weight history:  Wt Readings from Last 3 Encounters:  11/21/19 139 lb (63 kg)  10/13/19 133 lb 3.2 oz (60.4 kg)  09/28/19 135 lb 6.4 oz (61.4 kg)    Glycemic control:   Lab Results  Component Value Date   HGBA1C 8.4 (H) 10/31/2019   HGBA1C 6.0 (A) 09/28/2019   HGBA1C 5.6 06/26/2019   Lab Results  Component Value Date   MICROALBUR 1,289.4 (H) 03/07/2019   LDLCALC 57 01/30/2019   CREATININE 1.69 (H) 11/10/2019   Lab Results  Component Value Date  MICRALBCREAT 5 03/28/2018    Lab Results  Component Value Date   FRUCTOSAMINE 404 (H) 10/31/2019   FRUCTOSAMINE 256 06/26/2019    Office Visit on 11/21/2019  Component Date Value Ref Range Status  . POC Glucose 11/21/2019 258* 70 - 99 mg/dl Final    Allergies as of 11/21/2019      Reactions   Aspirin Nausea And Vomiting      Medication List       Accurate as of November 21, 2019 12:56 PM. If you have any questions, ask your nurse or doctor.        STOP taking these medications   empagliflozin 10 MG Tabs tablet Commonly known as: Jardiance Stopped by: Elayne Snare, MD     TAKE these medications   amLODipine 10 MG tablet Commonly known as: NORVASC Take 1 tablet by mouth once daily   b complex vitamins capsule Take 1 capsule by mouth daily.   B-D UF III MINI PEN NEEDLES 31G X 5 MM Misc Generic drug: Insulin Pen Needle USE TO INJECT INSULIN 3 TIMES A DAY   calcium-vitamin D 500-200  MG-UNIT tablet Commonly known as: OSCAL WITH D Take 1 tablet by mouth 2 (two) times daily.   cetaphil lotion Apply 1 application topically as needed for dry skin.   donepezil 10 MG tablet Commonly known as: ARICEPT Take 1 tablet (10 mg total) by mouth at bedtime.   Ferrocite 324 (106 Fe) MG Tabs tablet Generic drug: Ferrous Fumarate Take 1 tablet by mouth twice daily   gabapentin 100 MG capsule Commonly known as: Neurontin Take 1 capsule (100 mg total) by mouth 3 (three) times daily.   lactulose 10 GM/15ML solution Commonly known as: CHRONULAC TAKE 15 ML BY MOUTH TWO TIMES DAILY   levETIRAcetam 500 MG tablet Commonly known as: KEPPRA Take 1 tablet (500 mg total) by mouth 2 (two) times daily.   Medical Compression Stockings Misc Wear as much as possible while awake to reduce swelling   memantine 10 MG tablet Commonly known as: NAMENDA Take 1 tablet (10 mg total) by mouth 2 (two) times daily.   OneTouch Delica Lancets Fine Misc Check blood sugar up to 4 times a day   OneTouch Verio test strip Generic drug: glucose blood Use as instructed 4 times daily. E11.29, insulin requiring   OneTouch Verio w/Device Kit Use to check blood sugar up to 4 times a day   oxycodone-acetaminophen 2.5-325 MG tablet Commonly known as: PERCOCET Take 1 tablet by mouth at bedtime as needed for pain.   oxymetazoline 0.05 % nasal spray Commonly known as: AFRIN Place 1 spray into both nostrils 2 (two) times daily as needed (Epistaxis).   pantoprazole 40 MG tablet Commonly known as: PROTONIX Take 1 tablet by mouth once daily   potassium chloride 20 MEQ/15ML (10%) Soln Take 30 mLs (40 mEq total) by mouth 2 (two) times daily. Take 30 mL two times daily for 2 days, followed by 20 mls BID thereafter.   sodium chloride 0.65 % Soln nasal spray Commonly known as: OCEAN Place 1 spray into both nostrils as needed for congestion.   torsemide 20 MG tablet Commonly known as: DEMADEX Take 1  tablet by mouth once daily   Tresiba FlexTouch 100 UNIT/ML FlexTouch Pen Generic drug: insulin degludec INJECT 21 UNITS SUBCUTANEOUSLY IN THE EVENING   triamcinolone ointment 0.5 % Commonly known as: KENALOG APPLY 1 APPLICATION TOPICALLY TO RASH TWICE DAILY FOR ITCHING What changed:   how much to take  how to take this  when to take this  reasons to take this  additional instructions   Trulicity 3 ZO/1.0RU Sopn Generic drug: Dulaglutide Inject 3 mg into the skin once a week.       Allergies:  Allergies  Allergen Reactions  . Aspirin Nausea And Vomiting    Past Medical History:  Diagnosis Date  . Chronic anemia   . Chronic diastolic CHF (congestive heart failure) ([place]) 10/03/2013  . Chronic GI bleeding    [name] 11/29/2014  . Family history of anesthesia complication    "niece has a hard time coming out" (09/15/2012)  . Frequent nosebleeds    chronic  . Gastric AV malformation    [name] 11/29/2014  . GERD (gastroesophageal reflux disease)   . 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 History of blood transfusion "several"  . History of epistaxis   . HTN (hypertension), benign 03/02/2012  . Hyperlipidemia   . Iron deficiency anemia    chronic infusions"  . Lichen planus    Both lower extremities  . 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 Overgrown toenails 03/18/2017  . Pneumonia 1990's X 2  . Pulmonary HTN ([place]) 04/01/2017   PASP 50mmHg on echo 09/2016 and 84mmHg by echo 2019  . Seizures ([place]) 09/2014  . Symptomatic anemia 11/29/2014  . Telangiectasia    Gastric   . 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    Past Surgical History:  Procedure Laterality Date  . CATARACT EXTRACTION     "I think it was just one eye"  . ESOPHAGOGASTRODUODENOSCOPY  02/26/2011   Procedure: ESOPHAGOGASTRODUODENOSCOPY (EGD);  Surgeon: [name], MD;  Location: [name];  Service: Endoscopy;  Laterality: N/A;  .  ESOPHAGOGASTRODUODENOSCOPY N/A 11/08/2012   Procedure: ESOPHAGOGASTRODUODENOSCOPY (EGD);  Surgeon: [name], MD;  Location: [name];  Service: Endoscopy;  Laterality: N/A;  . ESOPHAGOGASTRODUODENOSCOPY N/A 10/04/2013   Procedure: ESOPHAGOGASTRODUODENOSCOPY (EGD);  Surgeon: Winfield Cunas., MD;  Location: [name];  Service: Endoscopy;  Laterality: N/A;  with APC on stand-by  . ESOPHAGOGASTRODUODENOSCOPY N/A 07/06/2014   Procedure: ESOPHAGOGASTRODUODENOSCOPY (EGD);  Surgeon: Clarene Essex, MD;  Location: [name];  Service: Endoscopy;  Laterality: N/A;  . ESOPHAGOGASTRODUODENOSCOPY N/A 09/05/2014   Procedure: ESOPHAGOGASTRODUODENOSCOPY (EGD);  Surgeon: Laurence Spates, MD;  Location: [name];  Service: Endoscopy;  Laterality: N/A;  APC on standby to control bleeding  . ESOPHAGOGASTRODUODENOSCOPY N/A 11/29/2014   Procedure: ESOPHAGOGASTRODUODENOSCOPY (EGD);  Surgeon: [name], MD;  Location: [facility] ENDOSCOPY;  Service: Endoscopy;  Laterality: N/A;  . ESOPHAGOGASTRODUODENOSCOPY N/A 09/28/2015   Procedure: ESOPHAGOGASTRODUODENOSCOPY (EGD);  Surgeon: Clarene Essex, MD;  Location: [facility] ENDOSCOPY;  Service: Endoscopy;  Laterality: N/A;  . ESOPHAGOGASTRODUODENOSCOPY (EGD) WITH PROPOFOL N/A 12/04/2017   Procedure: ESOPHAGOGASTRODUODENOSCOPY (EGD) WITH PROPOFOL;  Surgeon: [name], MD;  Location: [facility];  Service: Endoscopy;  Laterality: N/A;  . ESOPHAGOGASTRODUODENOSCOPY ENDOSCOPY  08/19/2006   with laser treatment  . HOT HEMOSTASIS N/A 07/06/2014   Procedure: HOT HEMOSTASIS (ARGON PLASMA COAGULATION/BICAP);  Surgeon: Clarene Essex, MD;  Location: [name];  Service: Endoscopy;  Laterality: N/A;  . HOT HEMOSTASIS N/A 09/28/2015   Procedure: HOT HEMOSTASIS (ARGON PLASMA COAGULATION/BICAP);  Surgeon: Clarene Essex, MD;  Location: [facility] ENDOSCOPY;  Service: Endoscopy;  Laterality: N/A;  . HOT HEMOSTASIS N/A 12/04/2017   Procedure: HOT HEMOSTASIS (ARGON PLASMA COAGULATION/BICAP);   Surgeon: [name], MD;  Location: [place] [facility];  Service: Endoscopy;  Laterality: N/A;  .  NASAL HEMORRHAGE CONTROL     "for bleeding"   . SAVORY DILATION  02/26/2011   Procedure: SAVORY DILATION;  Surgeon: [name], MD;  Location: [name];  Service: Endoscopy;  Laterality: N/A;  c-arm needed  . SUBMUCOSAL INJECTION  12/04/2017   Procedure: SUBMUCOSAL INJECTION;  Surgeon: [name], MD;  Location[facility] ENDOSCOPY;  Service: Endoscopy;;    Family History  Problem Relation Age of Onset  . Stroke Father   . Healthy Mother   . Breast cancer Other   . Diabetes Niece   . Malignant hyperthermia Neg Hx   . Seizures Neg Hx     Social History:  reports that she quit smoking about 48 years ago. Her smoking use included cigarettes. She has a 20.00 pack-year smoking history. She has never used smokeless tobacco. She reports that she does not drink alcohol and does not use drugs.   Review of Systems   Lipid history: No increase in LDL as of the last labs:    Lab Results  Component Value Date   CHOL 133 01/30/2019   HDL 41.30 01/30/2019   LDLCALC 57 01/30/2019   TRIG 176.0 (H) 01/30/2019   CHOLHDL 3 01/30/2019           Hypertension: taking amlodipine from her PCP  BP Readings from Last 3 Encounters:  11/21/19 122/70  10/13/19 (!) 139/49  10/13/19 (!) 144/57   Not clear why she has not recently renal dysfunction She is on Demadex  Lab Results  Component Value Date   CREATININE 1.69 (H) 11/10/2019   CREATININE 1.54 (H) 10/31/2019   CREATININE 1.13 (H) 09/21/2019    Most recent eye exam was in 07/2018  Most recent foot exam: 06/2018 by podiatrist with no evidence of sensory loss or reduction in sensation   Complications of diabetes: Reportedly has retinopathy, currently without a foot exam not clear if she has neuropathy.  Has history of microalbuminuria  LABS:  Office Visit on 11/21/2019  Component Date Value Ref Range Status  . POC Glucose  11/21/2019 258* 70 - 99 mg/dl Final    Physical Examination:  BP 122/70   Pulse 61   Wt 139 lb (63 kg)   LMP  (LMP Unknown)   SpO2 97%   BMI 24.62 kg/m    ASSESSMENT:  Diabetes type 2 on insulin  See history of present illness for detailed discussion of current diabetes management, blood sugar patterns and problems identified  A1c is up to 8.4 and this is highly unusual for her  She is currently on Trulicity 3 mg, Humalog only 3 units at bedtime and Tresiba 8 units once a day  She is unable to manage her diabetes on her own and does not appear to be understanding how to dose her insulin doses even with help from a family member Although she claims she is checking her sugars 5 times a day there is no meter available for download every time she comes Also not clear why her fasting readings are higher than usual, previously [place] was reduced because of morning hypoglycemia  Also likely has not benefited from [place] with higher sugars and also recently worsening renal function  HYPERTENSION: Consistently well controlled  PLAN:    Continue Trulicity 3 mg weekly Tresiba to be 10 units in the morning, however instructions given to increase it to 12 units if blood sugars are still higher in the morning after Friday  Take Humalog 3 units at lunch and dinnertime, also if  eating carbohydrate breakfast to take it in the morning also She will not take any Humalog at bedtime Stop Jardiance which she has not been able to get Look into the freestyle libre system as this would be much more useful for her especially with reducing the need for monitoring as well as alerting her to high and low sugars.  She will call the DME supplier, list given She was explained how this would be used and information available  However she will need to call us if she starts getting any hypoglycemia Discussed blood sugar targets both morning and after meals She must bring her meter for download on each  visit and to call back when she gets home to let us know her blood sugars recently  Follow-up in 1 about a month  Follow-up with PCP regarding renal function getting worse and hypertension  Patient Instructions  Start taking the gray pen HUMALOG 3 units before starting to eat lunch and dinner Also take 3 units at breakfast if eating any starchy foods like grits, pasta or bread  TRESIBA/blue pen: Take 10 units every morning If the blood sugar after Friday is still running over 200 in the mornings go up to 12 units  TRULICITY: Take 2 shots of the 1.5 mg until finished and then start 1 shot of the 3 mg weekly  Let us know if you have need help with starting the freestyle libre sensor, please call the DME supplier to send [place] the paperwork for this  Check blood sugars on waking up days a week  Also check blood sugars about 2 hours after meals and do this after different meals by rotation  Recommended blood sugar levels on waking up are 90-130 and about 2 hours after meal is 130-180  Please bring your blood sugar monitor to each visit, thank you           Elayne Snare 11/21/2019, 12:56 PM   Note: This office note was prepared with Dragon voice recognition system technology. Any transcriptional errors that result from this process are unintentional.

## 2019-11-21 NOTE — Patient Instructions (Addendum)
Start taking the gray pen HUMALOG 3 units before starting to eat lunch and dinner Also take 3 units at breakfast if eating any starchy foods like grits, pasta or bread  TRESIBA/blue pen: Take 10 units every morning If the blood sugar after Friday is still running over 200 in the mornings go up to 12 units  TRULICITY: Take 2 shots of the 1.5 mg until finished and then start 1 shot of the 3 mg weekly  Let us know if you have need help with starting the freestyle libre sensor, please call the DME supplier to send [place] the paperwork for this  Check blood sugars on waking up days a week  Also check blood sugars about 2 hours after meals and do this after different meals by rotation  Recommended blood sugar levels on waking up are 90-130 and about 2 hours after meal is 130-180  Please bring your blood sugar monitor to each visit, thank you

## 2019-11-22 ENCOUNTER — Other Ambulatory Visit: Payer: Self-pay | Admitting: Internal Medicine

## 2019-11-27 ENCOUNTER — Telehealth: Payer: Medicare Other

## 2019-11-28 ENCOUNTER — Telehealth: Payer: Self-pay

## 2019-11-28 NOTE — Telephone Encounter (Signed)
RTC, VM obtained and message for [facility] nurse to call triage back. SChaplin, RN,BSN

## 2019-11-28 NOTE — Telephone Encounter (Signed)
RTC to [place] w/ [facility], [place], will try call later. SChaplin, RN,BSN

## 2019-11-28 NOTE — Telephone Encounter (Signed)
Pls contact Ria Comment w/ [facility] [phone]

## 2019-11-30 NOTE — Telephone Encounter (Signed)
Returned call to [place] with [facility]. No answer. Left message on VM requesting return call. Hubbard Hartshorn, RN, BSN

## 2019-11-30 NOTE — Telephone Encounter (Signed)
Pls contact [place] nurse [phone]

## 2019-12-01 ENCOUNTER — Ambulatory Visit: Payer: Medicare Other

## 2019-12-01 ENCOUNTER — Telehealth: Payer: Medicare Other

## 2019-12-01 DIAGNOSIS — R413 Other amnesia: Secondary | ICD-10-CM

## 2019-12-01 DIAGNOSIS — I5032 Chronic diastolic (congestive) heart failure: Secondary | ICD-10-CM

## 2019-12-01 NOTE — Chronic Care Management (AMB) (Signed)
Care Management   Follow Up Note   12/01/2019 Name: [name] MRN: [id number] DOB: Aug 16, 1940  Referred by: Delice Bison, DO Reason for referral : Care Coordination (PCS)   [name] is a 79 y.o. year old female who is a primary care patient of Delice Bison, DO. The care management team was consulted for assistance with care management and care coordination needs.    Review of patient status, including review of consultants reports, relevant laboratory and other test results, and collaboration with appropriate care team members and the patient's provider was performed as part of comprehensive patient evaluation and provision of chronic care management services.    SDOH (Social Determinants of Health) assessments performed: No See Care Plan activities for detailed interventions related to [name])     Advanced Directives: See Care Plan and Vynca application for related entries.   Goals Addressed              This Visit's Progress   .  COMPLETED: "Do wives of veterans have benefits for home care assistance?" (pt-stated)        CARE PLAN ENTRY (see longitudinal plan of care for additional care plan information)  Current Barriers:  . 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 Patient says she thinks she's had home health services in the past but does not remember the name of the agency and she did not voice an agency preference. Patient says she would welcome any help in the home she can get as her husband is not doing well and  their nephew took him to the [place] clinic today, patient says her husband can no longer walk and has not been eating. She says she depends on her husband for most of her IADL"s an most ADLs. She says her niece [name] has been helping out with administering her medications and she says her sister is with her today while her husband is at the [place]. Patient gave permission for health team to discuss care with her niece [name]. She says [name] drives a school bus and is available via cell phone, # [phone].    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 Work with the care management team to address educational, disease management, and care coordination needs  . Begin or continue self health monitoring activities as directed today Measure and record cbg (blood glucose) 2-3 times daily, Measure and record blood pressure 1-2 times per week, and Measure and record weight daily . 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 Call care management team with questions or concerns . 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 Closing goal of care plan as CCM team has not  been able to maintain contact with patient or niece.      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 Patient is unable to independently self-manage chronic health conditions  Please see past updates related to this goal by clicking on the "Past Updates" button in the selected goal      .  COMPLETED: Per niece, "We need  help with transportation to medical appointments due to my work schedule"        CARE PLAN ENTRY (see longitudinal plan of care for additional care plan information)  Current Barriers:  . Niece is currently assisting patient and patient's spouse with transportation to medical appointments.  This is becoming increasingly difficult due to her work schedule.  Clinical Social Work Clinical Goal(s):  Marland Kitchen Over the next 30 days, patient will work with SW to address concerns related to transportation   Interventions: .  Closing goal of care plan as CCM team has not been able to maintain contact with patient or niece. Patient Self Care Activities:   Patient is unable to independently self-manage chronic health conditions .   Marland Kitchen Unable to perform ADLs independently . Unable to perform IADLs independently  Please see past updates related to this goal by clicking on the "Past Updates" button in the selected goal          We have been unable to make contact with the patient for follow up. The care management team is available to follow up with the patient after provider conversation with the patient regarding recommendation for care management engagement and subsequent re-referral to the care management team.    Ronn Melena, [place] Worker [place] [phone]

## 2019-12-04 ENCOUNTER — Telehealth: Payer: Medicare Other

## 2019-12-07 ENCOUNTER — Other Ambulatory Visit: Payer: Self-pay

## 2019-12-07 NOTE — Telephone Encounter (Signed)
torsemide (DEMADEX) 20 MG tablet, refill request @  [place], [place] Phone:  [phone]  Fax:  [phone]

## 2019-12-08 ENCOUNTER — Other Ambulatory Visit: Payer: Self-pay

## 2019-12-08 ENCOUNTER — Inpatient Hospital Stay: Payer: Medicare Other

## 2019-12-08 DIAGNOSIS — D500 Iron deficiency anemia secondary to blood loss (chronic): Secondary | ICD-10-CM

## 2019-12-08 DIAGNOSIS — I780 Hereditary hemorrhagic telangiectasia: Secondary | ICD-10-CM

## 2019-12-08 DIAGNOSIS — K31819 Angiodysplasia of stomach and duodenum without bleeding: Secondary | ICD-10-CM

## 2019-12-08 DIAGNOSIS — Z95828 Presence of other vascular implants and grafts: Secondary | ICD-10-CM

## 2019-12-08 LAB — CBC WITH DIFFERENTIAL (CANCER CENTER ONLY)
Abs Immature Granulocytes: 0.02 10*3/uL (ref 0.00–0.07)
Basophils Absolute: 0 10*3/uL (ref 0.0–0.1)
Basophils Relative: 1 %
Eosinophils Absolute: 0.3 10*3/uL (ref 0.0–0.5)
Eosinophils Relative: 6 %
HCT: 35.5 % — ABNORMAL LOW (ref 36.0–46.0)
Hemoglobin: 12 g/dL (ref 12.0–15.0)
Immature Granulocytes: 0 %
Lymphocytes Relative: 18 %
Lymphs Abs: 0.9 10*3/uL (ref 0.7–4.0)
MCH: 30 pg (ref 26.0–34.0)
MCHC: 33.8 g/dL (ref 30.0–36.0)
MCV: 88.8 fL (ref 80.0–100.0)
Monocytes Absolute: 0.5 10*3/uL (ref 0.1–1.0)
Monocytes Relative: 10 %
Neutro Abs: 3.4 10*3/uL (ref 1.7–7.7)
Neutrophils Relative %: 65 %
Platelet Count: 174 10*3/uL (ref 150–400)
RBC: 4 MIL/uL (ref 3.87–5.11)
RDW: 13.2 % (ref 11.5–15.5)
WBC Count: 5.2 10*3/uL (ref 4.0–10.5)
nRBC: 0 % (ref 0.0–0.2)

## 2019-12-08 LAB — FERRITIN: Ferritin: 72 ng/mL (ref 11–307)

## 2019-12-08 MED ORDER — SODIUM CHLORIDE 0.9% FLUSH
10.0000 mL | INTRAVENOUS | Status: DC | PRN
  Administered 2019-12-08: 10 mL
  Filled 2019-12-08: qty 10

## 2019-12-08 MED ORDER — TORSEMIDE 20 MG PO TABS: 20.0000 mg | ORAL_TABLET | Freq: Every day | ORAL | 3 refills | Status: DC

## 2019-12-08 MED ORDER — HEPARIN SOD (PORK) LOCK FLUSH 100 UNIT/ML IV SOLN
500.0000 [IU] | Freq: Once | INTRAVENOUS | Status: DC | PRN
  Filled 2019-12-08: qty 5

## 2019-12-08 NOTE — Patient Instructions (Signed)

## 2019-12-11 NOTE — Progress Notes (Signed)
Internal Medicine Clinic Resident  I have personally reviewed this encounter including the documentation in this note and/or discussed this patient with the care management provider. I will address any urgent items identified by the care management provider and will communicate my actions to the patient's PCP. I have reviewed the patient's CCM visit with my supervising attending, Dr Evette Doffing.  Marianna Payment, MD 12/11/2019

## 2019-12-11 NOTE — Progress Notes (Signed)
Internal Medicine Clinic Attending  CCM services provided by the care management provider and their documentation were discussed with Dr. Marianna Payment. We reviewed the pertinent findings, urgent action items addressed by the resident and non-urgent items to be addressed by the PCP.  I agree with the assessment, diagnosis, and plan of care documented in the CCM and resident's note.  Axel Filler, MD 12/11/2019

## 2019-12-12 ENCOUNTER — Telehealth: Payer: Self-pay | Admitting: Nurse Practitioner

## 2019-12-12 NOTE — Telephone Encounter (Signed)
Scheduled appt per 11/2 sch msg - pt daughter is aware.

## 2019-12-13 ENCOUNTER — Telehealth: Payer: Self-pay

## 2019-12-13 NOTE — Telephone Encounter (Signed)
Returned call to [place] at [facility]. No answer. Left message on VM requesting return call. Hubbard Hartshorn, RN, BSN

## 2019-12-13 NOTE — Telephone Encounter (Signed)
Pls contact [name] Comment at [place] with VO

## 2019-12-15 ENCOUNTER — Inpatient Hospital Stay: Payer: Medicare Other | Attending: Hematology

## 2019-12-15 ENCOUNTER — Other Ambulatory Visit: Payer: Self-pay

## 2019-12-15 VITALS — BP 122/46 | HR 64 | Temp 98.4°F | Resp 18

## 2019-12-15 DIAGNOSIS — D500 Iron deficiency anemia secondary to blood loss (chronic): Secondary | ICD-10-CM

## 2019-12-15 DIAGNOSIS — K31819 Angiodysplasia of stomach and duodenum without bleeding: Secondary | ICD-10-CM

## 2019-12-15 DIAGNOSIS — I780 Hereditary hemorrhagic telangiectasia: Secondary | ICD-10-CM | POA: Insufficient documentation

## 2019-12-15 DIAGNOSIS — Z95828 Presence of other vascular implants and grafts: Secondary | ICD-10-CM

## 2019-12-15 DIAGNOSIS — D509 Iron deficiency anemia, unspecified: Secondary | ICD-10-CM | POA: Diagnosis not present

## 2019-12-15 DIAGNOSIS — K31811 Angiodysplasia of stomach and duodenum with bleeding: Secondary | ICD-10-CM

## 2019-12-15 MED ORDER — HEPARIN SOD (PORK) LOCK FLUSH 100 UNIT/ML IV SOLN
500.0000 [IU] | Freq: Once | INTRAVENOUS | Status: DC | PRN
  Filled 2019-12-15: qty 5

## 2019-12-15 MED ORDER — SODIUM CHLORIDE 0.9% FLUSH
10.0000 mL | INTRAVENOUS | Status: DC | PRN
  Filled 2019-12-15: qty 10

## 2019-12-15 MED ORDER — SODIUM CHLORIDE 0.9 % IV SOLN
Freq: Once | INTRAVENOUS | Status: AC
  Filled 2019-12-15: qty 250

## 2019-12-15 MED ORDER — SODIUM CHLORIDE 0.9 % IV SOLN
510.0000 mg | Freq: Once | INTRAVENOUS | Status: AC
  Administered 2019-12-15: 510 mg via INTRAVENOUS
  Filled 2019-12-15: qty 510

## 2019-12-15 NOTE — Patient Instructions (Signed)
Iron Deficiency Anemia, Adult Iron-deficiency anemia is when you have a low amount of red blood cells or hemoglobin. This happens because you have too Kise iron in your body. Hemoglobin carries oxygen to parts of the body. Anemia can cause your body to not get enough oxygen. It may or may not cause symptoms. Follow these instructions at home: Medicines  Take over-the-counter and prescription medicines only as told by your doctor. This includes iron pills (supplements) and vitamins.  If you cannot handle taking iron pills by mouth, ask your doctor about getting iron through: ? A vein (intravenously). ? A shot (injection) into a muscle.  Take iron pills when your stomach is empty. If you cannot handle this, take them with food.  Do not drink milk or take antacids at the same time as your iron pills.  To prevent trouble pooping (constipation), eat fiber or take medicine (stool softener) as told by your doctor. Eating and drinking   Talk with your doctor before changing the foods you eat. He or she may tell you to eat foods that have a lot of iron, such as: ? Liver. ? Lowfat (lean) beef. ? Breads and cereals that have iron added to them (fortified breads and cereals). ? Eggs. ? Dried fruit. ? Dark green, leafy vegetables.  Drink enough fluid to keep your pee (urine) clear or pale yellow.  Eat fresh fruits and vegetables that are high in vitamin C. They help your body to use iron. Foods with a lot of vitamin C include: ? Oranges. ? Peppers. ? Tomatoes. ? Mangoes. General instructions  Return to your normal activities as told by your doctor. Ask your doctor what activities are safe for you.  Keep yourself clean, and keep things clean around you (your surroundings). Anemia can make you get sick more easily.  Keep all follow-up visits as told by your doctor. This is important. Contact a doctor if:  You feel sick to your stomach (nauseous).  You throw up (vomit).  You feel  weak.  You are sweating for no clear reason.  You have trouble pooping, such as: ? Pooping (having a bowel movement) less than 3 times a week. ? Straining to poop. ? Having poop that is hard, dry, or larger than normal. ? Feeling full or bloated. ? Pain in the lower belly. ? Not feeling better after pooping. Get help right away if:  You pass out (faint). If this happens, do not drive yourself to the hospital. Call your local emergency services (911 in the [place]  You have chest pain.  You have shortness of breath that: ? Is very bad. ? Gets worse with physical activity.  You have a fast heartbeat.  You get light-headed when getting up from sitting or lying down. This information is not intended to replace advice given to you by your health care provider. Make sure you discuss any questions you have with your health care provider. Document Revised: 01/08/2017 Document Reviewed: 10/16/2015 Elsevier Patient Education  2020 Elsevier Inc.  

## 2019-12-25 ENCOUNTER — Other Ambulatory Visit: Payer: Self-pay | Admitting: *Deleted

## 2019-12-25 DIAGNOSIS — Z794 Long term (current) use of insulin: Secondary | ICD-10-CM

## 2019-12-25 DIAGNOSIS — IMO0002 Reserved for concepts with insufficient information to code with codable children: Secondary | ICD-10-CM

## 2019-12-25 DIAGNOSIS — E1121 Type 2 diabetes mellitus with diabetic nephropathy: Secondary | ICD-10-CM

## 2019-12-25 DIAGNOSIS — R413 Other amnesia: Secondary | ICD-10-CM

## 2019-12-25 DIAGNOSIS — E1165 Type 2 diabetes mellitus with hyperglycemia: Secondary | ICD-10-CM

## 2019-12-25 DIAGNOSIS — L439 Lichen planus, unspecified: Secondary | ICD-10-CM

## 2019-12-25 NOTE — Telephone Encounter (Signed)
New pharmacy to patient for multi-dose packaging through mail order. Hubbard Hartshorn, BSN, RN-BC

## 2019-12-26 ENCOUNTER — Other Ambulatory Visit: Payer: Self-pay | Admitting: Internal Medicine

## 2019-12-26 NOTE — Telephone Encounter (Signed)
This refill request has been sent in separate encounter. Hubbard Hartshorn, BSN, RN-BC

## 2019-12-27 MED ORDER — FERROUS FUMARATE 324 (106 FE) MG PO TABS
1.0000 | ORAL_TABLET | ORAL | 5 refills | Status: DC
Start: 2019-12-27 — End: 2020-01-02

## 2019-12-27 MED ORDER — LEVETIRACETAM 500 MG PO TABS
500.0000 mg | ORAL_TABLET | Freq: Two times a day (BID) | ORAL | 5 refills | Status: DC
Start: 2019-12-27 — End: 2020-01-02

## 2019-12-27 MED ORDER — AMLODIPINE BESYLATE 10 MG PO TABS
10.0000 mg | ORAL_TABLET | Freq: Every day | ORAL | 11 refills | Status: DC
Start: 2019-12-27 — End: 2020-01-02

## 2019-12-27 MED ORDER — TRULICITY 3 MG/0.5ML ~~LOC~~ SOAJ
3.0000 mg | SUBCUTANEOUS | 6 refills | Status: DC
Start: 2019-12-27 — End: 2020-01-02

## 2019-12-27 MED ORDER — POTASSIUM CHLORIDE ER 20 MEQ PO TBCR: 20.0000 meq | EXTENDED_RELEASE_TABLET | Freq: Two times a day (BID) | ORAL | 5 refills | Status: DC

## 2019-12-27 MED ORDER — MEMANTINE HCL 10 MG PO TABS
10.0000 mg | ORAL_TABLET | Freq: Two times a day (BID) | ORAL | 11 refills | Status: DC
Start: 2019-12-27 — End: 2020-01-02

## 2019-12-27 MED ORDER — TRIAMCINOLONE ACETONIDE 0.5 % EX OINT: TOPICAL_OINTMENT | CUTANEOUS | 5 refills | Status: DC

## 2019-12-27 MED ORDER — DONEPEZIL HCL 10 MG PO TABS: 10.0000 mg | ORAL_TABLET | Freq: Every day | ORAL | 5 refills | Status: DC

## 2019-12-27 MED ORDER — TORSEMIDE 20 MG PO TABS
20.0000 mg | ORAL_TABLET | Freq: Every day | ORAL | 11 refills | Status: DC
Start: 2019-12-27 — End: 2020-01-02

## 2019-12-27 MED ORDER — GABAPENTIN 100 MG PO CAPS
100.0000 mg | ORAL_CAPSULE | Freq: Two times a day (BID) | ORAL | 5 refills | Status: DC
Start: 2019-12-27 — End: 2020-01-02

## 2019-12-27 MED ORDER — TRESIBA FLEXTOUCH 100 UNIT/ML ~~LOC~~ SOPN: 10.0000 [IU] | PEN_INJECTOR | Freq: Every day | SUBCUTANEOUS | 2 refills | Status: DC

## 2019-12-27 MED ORDER — BD PEN NEEDLE MINI U/F 31G X 5 MM MISC: 11 refills | Status: DC

## 2019-12-27 MED ORDER — INSULIN LISPRO (1 UNIT DIAL) 100 UNIT/ML (KWIKPEN): PEN_INJECTOR | SUBCUTANEOUS | 2 refills | Status: DC

## 2019-12-27 NOTE — Telephone Encounter (Signed)
Discussed medications at length with her pharmacy and her daughter, who is helping with her medications. The only changes I made were:  Switching gabapentin to BID per patient request Switched ferrocite to every other for better side effect profile and adequate absorption Stopped protonix per patient request Switched potassium to tablet form  I also messaged her endocrinologist and confirmed her insulin and Trulicity regimens.

## 2019-12-27 NOTE — Telephone Encounter (Signed)
Pls contact [name] [phone] regarding medicine

## 2019-12-29 ENCOUNTER — Telehealth: Payer: Self-pay | Admitting: Endocrinology

## 2019-12-29 NOTE — Telephone Encounter (Signed)
-----   Message from Elayne Snare, MD sent at 12/27/2019 11:48 AM EST ----- Regarding: RE: medication reconciliation for this patient Thank you for doing thatEndo pool please schedule her for follow-up visit, same day labs ----- Message ----- From: Andrew Au, MD Sent: 12/27/2019  11:33 AM EST To: Elayne Snare, MD Subject: medication reconciliation for this patient     Hi Dr. Dwyane Dee,  This patient is trying to switch to a mail order pharmacy which will package her meds for her to help with compliance. They sent a request to me clarifying her meds and dosages. I read your recent note for her and saw that there are some discrepancies from the prescriptions in our system. I adjusted them to match what you recommended, as below.   Humalog 3 units at lunch and bedtime, and with breakfast if eating carb heavy meal Tresiba 10 units daily Trulicity 3mg  weekly  Please let me know if there need to be any changes.   Thanks! Dr. Bridgett Larsson

## 2019-12-29 NOTE — Telephone Encounter (Signed)
LMTCB to schedule follow up and same day labs

## 2020-01-01 NOTE — Telephone Encounter (Signed)
Hi Rachelle,  Will you please follow up with patient's pharmacy [phone]  Maybe you can help sort out whatever it is they need, looks like it's been ongoing since 11/15? Thank you! SChaplin, RN,BSN

## 2020-01-01 NOTE — Telephone Encounter (Signed)
[name] [phone] pls contact pharmacy

## 2020-01-02 ENCOUNTER — Other Ambulatory Visit: Payer: Self-pay | Admitting: Internal Medicine

## 2020-01-02 MED ORDER — LEVETIRACETAM 500 MG PO TABS
500.0000 mg | ORAL_TABLET | Freq: Two times a day (BID) | ORAL | 5 refills | Status: DC
Start: 2020-01-02 — End: 2020-01-23

## 2020-01-02 MED ORDER — FERROUS FUMARATE 324 (106 FE) MG PO TABS
1.0000 | ORAL_TABLET | ORAL | 5 refills | Status: DC
Start: 2020-01-02 — End: 2020-09-30

## 2020-01-02 MED ORDER — TRESIBA FLEXTOUCH 100 UNIT/ML ~~LOC~~ SOPN: 10.0000 [IU] | PEN_INJECTOR | Freq: Every day | SUBCUTANEOUS | 2 refills | Status: DC

## 2020-01-02 MED ORDER — POTASSIUM CHLORIDE ER 20 MEQ PO TBCR
20.0000 meq | EXTENDED_RELEASE_TABLET | Freq: Two times a day (BID) | ORAL | 5 refills | Status: DC
Start: 2020-01-02 — End: 2020-12-26

## 2020-01-02 MED ORDER — GABAPENTIN 100 MG PO CAPS
100.0000 mg | ORAL_CAPSULE | Freq: Two times a day (BID) | ORAL | 5 refills | Status: DC
Start: 2020-01-02 — End: 2020-05-08

## 2020-01-02 MED ORDER — TRULICITY 3 MG/0.5ML ~~LOC~~ SOAJ: 3.0000 mg | SUBCUTANEOUS | 6 refills | Status: DC

## 2020-01-02 MED ORDER — TORSEMIDE 20 MG PO TABS
20.0000 mg | ORAL_TABLET | Freq: Every day | ORAL | 11 refills | Status: DC
Start: 2020-01-02 — End: 2020-12-26

## 2020-01-02 MED ORDER — DONEPEZIL HCL 10 MG PO TABS: 10.0000 mg | ORAL_TABLET | Freq: Every day | ORAL | 5 refills | Status: DC

## 2020-01-02 MED ORDER — INSULIN LISPRO (1 UNIT DIAL) 100 UNIT/ML (KWIKPEN)
PEN_INJECTOR | SUBCUTANEOUS | 2 refills | Status: DC
Start: 2020-01-02 — End: 2020-06-07

## 2020-01-02 MED ORDER — BD PEN NEEDLE MINI U/F 31G X 5 MM MISC: 11 refills | Status: DC

## 2020-01-02 MED ORDER — TRIAMCINOLONE ACETONIDE 0.5 % EX OINT: TOPICAL_OINTMENT | CUTANEOUS | 5 refills | Status: DC

## 2020-01-02 MED ORDER — AMLODIPINE BESYLATE 10 MG PO TABS
10.0000 mg | ORAL_TABLET | Freq: Every day | ORAL | 11 refills | Status: DC
Start: 2020-01-02 — End: 2020-10-07

## 2020-01-02 MED ORDER — MEMANTINE HCL 10 MG PO TABS
10.0000 mg | ORAL_TABLET | Freq: Two times a day (BID) | ORAL | 11 refills | Status: DC
Start: 2020-01-02 — End: 2020-11-11

## 2020-01-02 NOTE — Telephone Encounter (Signed)
Dr.Chen's refills did not go through. I will send prescriptions.

## 2020-01-05 ENCOUNTER — Other Ambulatory Visit: Payer: Self-pay

## 2020-01-05 ENCOUNTER — Inpatient Hospital Stay: Payer: Medicare Other

## 2020-01-05 DIAGNOSIS — D500 Iron deficiency anemia secondary to blood loss (chronic): Secondary | ICD-10-CM

## 2020-01-05 DIAGNOSIS — Z95828 Presence of other vascular implants and grafts: Secondary | ICD-10-CM

## 2020-01-05 DIAGNOSIS — I780 Hereditary hemorrhagic telangiectasia: Secondary | ICD-10-CM

## 2020-01-05 DIAGNOSIS — K31819 Angiodysplasia of stomach and duodenum without bleeding: Secondary | ICD-10-CM

## 2020-01-05 LAB — TOTAL PROTEIN, URINE DIPSTICK: Protein, ur: 30 mg/dL — AB

## 2020-01-05 LAB — CMP (CANCER CENTER ONLY)
ALT: 27 U/L (ref 0–44)
AST: 37 U/L (ref 15–41)
Albumin: 3.3 g/dL — ABNORMAL LOW (ref 3.5–5.0)
Alkaline Phosphatase: 163 U/L — ABNORMAL HIGH (ref 38–126)
Anion gap: 12 (ref 5–15)
BUN: 30 mg/dL — ABNORMAL HIGH (ref 8–23)
CO2: 28 mmol/L (ref 22–32)
Calcium: 9.6 mg/dL (ref 8.9–10.3)
Chloride: 105 mmol/L (ref 98–111)
Creatinine: 1.69 mg/dL — ABNORMAL HIGH (ref 0.44–1.00)
GFR, Estimated: 31 mL/min — ABNORMAL LOW (ref >60)
Glucose, Bld: 203 mg/dL — ABNORMAL HIGH (ref 70–99)
Potassium: 4.3 mmol/L (ref 3.5–5.1)
Sodium: 145 mmol/L (ref 135–145)
Total Bilirubin: 0.6 mg/dL (ref 0.3–1.2)
Total Protein: 6.9 g/dL (ref 6.5–8.1)

## 2020-01-05 LAB — CBC WITH DIFFERENTIAL (CANCER CENTER ONLY)
Abs Immature Granulocytes: 0.02 10*3/uL (ref 0.00–0.07)
Basophils Absolute: 0 10*3/uL (ref 0.0–0.1)
Basophils Relative: 1 %
Eosinophils Absolute: 0.4 10*3/uL (ref 0.0–0.5)
Eosinophils Relative: 8 %
HCT: 36.4 % (ref 36.0–46.0)
Hemoglobin: 12.4 g/dL (ref 12.0–15.0)
Immature Granulocytes: 0 %
Lymphocytes Relative: 15 %
Lymphs Abs: 0.8 10*3/uL (ref 0.7–4.0)
MCH: 30.7 pg (ref 26.0–34.0)
MCHC: 34.1 g/dL (ref 30.0–36.0)
MCV: 90.1 fL (ref 80.0–100.0)
Monocytes Absolute: 0.5 10*3/uL (ref 0.1–1.0)
Monocytes Relative: 9 %
Neutro Abs: 3.7 10*3/uL (ref 1.7–7.7)
Neutrophils Relative %: 67 %
Platelet Count: 153 10*3/uL (ref 150–400)
RBC: 4.04 MIL/uL (ref 3.87–5.11)
RDW: 13.3 % (ref 11.5–15.5)
WBC Count: 5.5 10*3/uL (ref 4.0–10.5)
nRBC: 0 % (ref 0.0–0.2)

## 2020-01-05 LAB — FERRITIN: Ferritin: 232 ng/mL (ref 11–307)

## 2020-01-05 MED ORDER — SODIUM CHLORIDE 0.9% FLUSH
10.0000 mL | INTRAVENOUS | Status: DC | PRN
  Administered 2020-01-05: 10 mL
  Filled 2020-01-05: qty 10

## 2020-01-05 MED ORDER — HEPARIN SOD (PORK) LOCK FLUSH 100 UNIT/ML IV SOLN
500.0000 [IU] | Freq: Once | INTRAVENOUS | Status: AC | PRN
  Administered 2020-01-05: 500 [IU]
  Filled 2020-01-05: qty 5

## 2020-01-18 ENCOUNTER — Telehealth: Payer: Self-pay | Admitting: Family Medicine

## 2020-01-18 NOTE — Telephone Encounter (Signed)
This was prescribed by Dr Madalyn Rob to [facility] (mail order?) on 01/02/20. I called the pt and LVM (ok per DPR) asking for her to let us know if [facility] is where she is getting the keppra from and if not, she should reach out to her primary care to have it sent to [facility]. Left office number in message.

## 2020-01-18 NOTE — Telephone Encounter (Signed)
[facility]) called, did not get Keppra prescription sent on 11/23. Need current prescription for levETIRAcetam (KEPPRA) 500 MG tablet. Contact info: [phone]

## 2020-01-22 NOTE — Telephone Encounter (Signed)
I called the pt again at [phone] and LVM (ok per DPR) asking for call back. Left message as noted below from last Thursday.

## 2020-01-23 ENCOUNTER — Other Ambulatory Visit: Payer: Self-pay | Admitting: Family Medicine

## 2020-01-23 MED ORDER — LEVETIRACETAM 500 MG PO TABS: 500.0000 mg | ORAL_TABLET | Freq: Two times a day (BID) | ORAL | 3 refills | Status: DC

## 2020-01-23 NOTE — Telephone Encounter (Signed)
Done

## 2020-01-23 NOTE — Telephone Encounter (Signed)
Spoke with [name] on 01/22/20 and was told the pt had contact them for refill of keppra and they automatically reached out to our office because we last prescribed the  refill with them.

## 2020-01-31 NOTE — Progress Notes (Signed)
[name]   [phone] [phone]   Clinic Follow up Note   Patient Care Team: Delice Bison, DO as PCP - General [name], MD as PCP - Cardiology (Cardiology) Truitt Merle, MD as Consulting Physician (Hematology) Elayne Snare, MD as Consulting Physician (Endocrinology) 02/01/2020  CHIEF COMPLAINT: Follow up anemia and HHT  HEMATOLOGY HISTORY 1.Hereditary hemorrhagic telangiectasia (HHT),with epistaxis andfrequentGI bleeding. Previously under care of Dr. [name] 2.Anemia secondary to GI bleeding and iron deficiency, required blood transfusion every 3 to 4 weeks, IV Feraheme every 1 to 2 months  CURRENT THERAPY 1. Iv feraheme as needed if ferritin<100, latest on 08/18/19 2.Avastin75m/kg every 2 weeks, starting3/30/20. Reduced to every 4 weeks starting 11/07/18.Reduced to every 2 months starting 01/02/19.Held 03/09/19-4/16/21due to high proteinuria. Give Avastin as needed 3. Blood transfusion if Hg<8.0  INTERVAL HISTORY: Ms. LBoehningreturns for follow up as scheduled. She was last seen 10/13/19 and received bevacizumab. [facility].  Feels well overall but tired lately.  She is waking up early in the morning.  Not sure if this is medication related. Denies recent bleeding. Takes OTC iron, no constipation on stool softener. Eating and drinking well. No recent fever, chills, cough, chest pain, dyspnea or new concerns.    MEDICAL HISTORY:  Past Medical History:  Diagnosis Date  . Chronic anemia   . Chronic diastolic CHF (congestive heart failure) ([place] 10/03/2013  . Chronic GI bleeding    /[name]10/20/2016  . Family history of anesthesia complication    "niece has a hard time coming out" (09/15/2012)  . Frequent nosebleeds    chronic  . Gastric AV malformation    /[name]10/20/2016  . GERD (gastroesophageal reflux disease)   . 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 History of blood transfusion  "several"  . History of epistaxis   . HTN (hypertension), benign 03/02/2012  . Hyperlipidemia   . Iron deficiency anemia    chronic infusions"  . Lichen planus    Both lower extremities  . 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 Overgrown toenails 03/18/2017  . Pneumonia 1990's X 2  . Pulmonary HTN ([place] 04/01/2017   PASP 525mg on echo 09/2016 and 4547m by echo 2019  . Seizures ([place]  . Symptomatic anemia 11/29/2014  . Telangiectasia    Gastric   . 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    SURGICAL HISTORY: Past Surgical History:  Procedure Laterality Date  . CATARACT EXTRACTION     "I think it was just one eye"  . ESOPHAGOGASTRODUODENOSCOPY  02/26/2011   Procedure: ESOPHAGOGASTRODUODENOSCOPY (EGD);  Surgeon: JohMissy SabinsD;  [facility];  Service: Endoscopy;  Laterality: N/A;  . ESOPHAGOGASTRODUODENOSCOPY N/A 11/08/2012   Procedure: ESOPHAGOGASTRODUODENOSCOPY (EGD);  Surgeon: PatBeryle BeamsD;  [facility];  Service: Endoscopy;  Laterality: N/A;  . ESOPHAGOGASTRODUODENOSCOPY N/A 10/04/2013   Procedure: ESOPHAGOGASTRODUODENOSCOPY (EGD);  Surgeon: JamWinfield CunasMD;  [facility];  Service: Endoscopy;  Laterality: N/A;  with APC on stand-by  . ESOPHAGOGASTRODUODENOSCOPY N/A 07/06/2014   Procedure: ESOPHAGOGASTRODUODENOSCOPY (EGD);  Surgeon: MarClarene EssexD;  [facility];  Service: Endoscopy;  Laterality: N/A;  . ESOPHAGOGASTRODUODENOSCOPY N/A 09/05/2014   Procedure: ESOPHAGOGASTRODUODENOSCOPY (EGD);  Surgeon: JamLaurence SpatesD;  [facility];  Service: Endoscopy;  Laterality: N/A;  APC on standby to control bleeding  . ESOPHAGOGASTRODUODENOSCOPY N/A 11/29/2014   Procedure: ESOPHAGOGASTRODUODENOSCOPY (EGD);  Surgeon: [name], MD;  Location[facility];  Service: Endoscopy;  Laterality: N/A;  . ESOPHAGOGASTRODUODENOSCOPY N/A 09/28/2015   Procedure: ESOPHAGOGASTRODUODENOSCOPY  (EGD);  Surgeon: Clarene Essex, MD;  Location: [facility] ENDOSCOPY;  Service: Endoscopy;  Laterality: N/A;  . ESOPHAGOGASTRODUODENOSCOPY (EGD) WITH PROPOFOL N/A 12/04/2017   Procedure: ESOPHAGOGASTRODUODENOSCOPY (EGD) WITH PROPOFOL;  Surgeon: [name], MD;  Location: [facility];  Service: Endoscopy;  Laterality: N/A;  . ESOPHAGOGASTRODUODENOSCOPY ENDOSCOPY  08/19/2006   with laser treatment  . HOT HEMOSTASIS N/A 07/06/2014   Procedure: HOT HEMOSTASIS (ARGON PLASMA COAGULATION/BICAP);  Surgeon: Clarene Essex, MD;  [name];  Service: Endoscopy;  Laterality: N/A;  . HOT HEMOSTASIS N/A 09/28/2015   Procedure: HOT HEMOSTASIS (ARGON PLASMA COAGULATION/BICAP);  Surgeon: Clarene Essex, MD;  Location[facility] ENDOSCOPY;  Service: Endoscopy;  Laterality: N/A;  . HOT HEMOSTASIS N/A 12/04/2017   Procedure: HOT HEMOSTASIS (ARGON PLASMA COAGULATION/BICAP);  Surgeon: [name], MD;  Location: [facility];  Service: Endoscopy;  Laterality: N/A;  . NASAL HEMORRHAGE CONTROL     "for bleeding"   . SAVORY DILATION  02/26/2011   Procedure: SAVORY DILATION;  Surgeon: [name], MD;  [name];  Service: Endoscopy;  Laterality: N/A;  c-arm needed  . SUBMUCOSAL INJECTION  12/04/2017   Procedure: SUBMUCOSAL INJECTION;  Surgeon: [name], MD;  Location: [facility] ENDOSCOPY;  Service: Endoscopy;;    I have reviewed the social history and family history with the patient and they are unchanged from previous note.  ALLERGIES:  is allergic to aspirin.  MEDICATIONS:  Current Outpatient Medications  Medication Sig Dispense Refill  . amLODipine (NORVASC) 10 MG tablet Take 1 tablet (10 mg total) by mouth daily. 30 tablet 11  . Blood Glucose Monitoring Suppl (ONETOUCH VERIO) w/Device KIT Use to check blood sugar up to 4 times a day 1 kit 1  . cetaphil (CETAPHIL) lotion Apply 1 application topically as needed for dry skin. 236 mL 0  . donepezil (ARICEPT) 10 MG tablet Take 1 tablet (10 mg total)  by mouth at bedtime. 30 tablet 5  . Dulaglutide (TRULICITY) 3 UJ/8.1XB SOPN Inject 3 mg into the skin once a week. 2 mL 6  . Elastic Bandages & Supports (MEDICAL COMPRESSION STOCKINGS) MISC Wear as much as possible while awake to reduce swelling 2 each 0  . Ferrous Fumarate (FERROCITE) 324 (106 Fe) MG TABS tablet Take 1 tablet (106 mg of iron total) by mouth every other day. 15 tablet 5  . gabapentin (NEURONTIN) 100 MG capsule Take 1 capsule (100 mg total) by mouth 2 (two) times daily. 60 capsule 5  . glucose blood (ONETOUCH VERIO) test strip Use as instructed 4 times daily. E11.29, insulin requiring 350 each 3  . insulin degludec (TRESIBA FLEXTOUCH) 100 UNIT/ML FlexTouch Pen Inject 10 Units into the skin daily. 15 mL 2  . insulin lispro (HUMALOG) 100 UNIT/ML KwikPen Inject 3 U with lunch and dinner. If eating a breakfast heavy in carbs such as grits or hash browns, also inject 3 U with breakfast. 15 mL 2  . Insulin Pen Needle (B-D UF III MINI PEN NEEDLES) 31G X 5 MM MISC Use as directed to inject insulin 100 each 11  . lactulose (CHRONULAC) 10 GM/15ML solution TAKE 15 ML BY MOUTH TWO TIMES DAILY 473 mL 2  . levETIRAcetam (KEPPRA) 500 MG tablet Take 1 tablet (500 mg total) by mouth 2 (two) times daily. 180 tablet 3  . memantine (NAMENDA) 10 MG tablet Take 1 tablet (10 mg total)  by mouth 2 (two) times daily. 60 tablet 11  . ONETOUCH DELICA LANCETS FINE MISC Check blood sugar up to 4 times a day 300 each 4  . oxymetazoline (AFRIN) 0.05 % nasal spray Place 1 spray into both nostrils 2 (two) times daily as needed (Epistaxis). 30 mL 0  . pantoprazole (PROTONIX) 40 MG tablet Take 1 tablet by mouth once daily (Patient not taking: Reported on 11/21/2019) 180 tablet 0  . Potassium Chloride ER 20 MEQ TBCR Take 20 mEq by mouth 2 (two) times daily. 60 tablet 5  . torsemide (DEMADEX) 20 MG tablet Take 1 tablet (20 mg total) by mouth daily. 30 tablet 11  . triamcinolone ointment (KENALOG) 0.5 % APPLY 1  APPLICATION TOPICALLY TO RASH TWICE DAILY FOR ITCHING 15 g 5   No current facility-administered medications for this visit.   Facility-Administered Medications Ordered in Other Visits  Medication Dose Route Frequency Provider Last Rate Last Admin  . sodium chloride 0.9 % injection 10 mL  10 mL Intracatheter PRN Truitt Merle, MD      . sodium chloride flush (NS) 0.9 % injection 10 mL  10 mL Intracatheter PRN Truitt Merle, MD   10 mL at 08/18/19 1717    PHYSICAL EXAMINATION:   Vitals:   02/01/20 1154  BP: (!) 145/88  Pulse: 67  Resp: 18  Temp: 98.6 F (37 C)  SpO2: 100%   Filed Weights   02/01/20 1154  Weight: 125 lb (56.7 kg)    GENERAL:alert, no distress and comfortable SKIN: no rash  EYES:  sclera clear LUNGS:  normal breathing effort HEART: no lower extremity edema NEURO: alert & oriented x 3 with fluent speech PAC without erythema   LABORATORY DATA:  I have reviewed the data as listed CBC Latest Ref Rng & Units 02/01/2020 01/05/2020 12/08/2019  WBC 4.0 - 10.5 K/uL 5.3 5.5 5.2  Hemoglobin 12.0 - 15.0 g/dL 12.6 12.4 12.0  Hematocrit 36.0 - 46.0 % 35.8(L) 36.4 35.5(L)  Platelets 150 - 400 K/uL 135(L) 153 174     CMP Latest Ref Rng & Units 02/01/2020 01/05/2020 11/10/2019  Glucose 70 - 99 mg/dL 222(H) 203(H) 315(H)  BUN 8 - 23 mg/dL 14 30(H) 18  Creatinine 0.44 - 1.00 mg/dL 1.27(H) 1.69(H) 1.69(H)  Sodium 135 - 145 mmol/L 140 145 137  Potassium 3.5 - 5.1 mmol/L 4.5 4.3 3.5  Chloride 98 - 111 mmol/L 108 105 99  CO2 22 - 32 mmol/L _0 Calcium 8.9 - 10.3 mg/dL 9.9 9.6 9.6  Total Protein 6.5 - 8.1 g/dL 7.2 6.9 7.1  Total Bilirubin 0.3 - 1.2 mg/dL 0.6 0.6 0.9  Alkaline Phos 38 - 126 U/L 150(H) 163(H) 212(H)  AST 15 - 41 U/L 34 37 30  ALT 0 - 44 U/L _1 RADIOGRAPHIC STUDIES: I have personally reviewed the radiological images as listed and agreed with the findings in the report. No results found.   ASSESSMENT & PLAN: [name] is a 79 y.o.  female with    1.Hereditary hemorrhagic telangiectasia(HHT)), withepistaxis and frequent GI bleeding -controlled with Avastin, IV Feraheme PRN for ferritin <100. And RBC transfusion if needed but has not needed in quite a while  -continue oral iron 2. 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 DM, CHF, CKD stage III and HTN secondary to Avastin - followed by Dr. Dwyane Dee 6. Worsening proteinuria - likely  secondary to avastin. Give PRN now   Disposition:  [name] appears stable. No signs of recurrent bleeding.   Labs reviewed, Hg 12.4. Trace urine protein. Ferritin on 12/26 of 232. She will continue oral iron, does not need Feraheme for now. Since her Ur protein is low I recommend to give Avastin in the next week.   We will continue labs every 4 weeks, and IV Feraheme if ferritin <100, and avastin as needed. F/up in 4 months.   All questions were answered. The patient knows to call the clinic with any problems, questions or concerns. No barriers to learning were detected.     Alla Feeling, NP 02/01/20

## 2020-02-01 ENCOUNTER — Other Ambulatory Visit: Payer: Self-pay | Admitting: *Deleted

## 2020-02-01 ENCOUNTER — Ambulatory Visit (INDEPENDENT_AMBULATORY_CARE_PROVIDER_SITE_OTHER): Payer: Medicare Other | Admitting: Endocrinology

## 2020-02-01 ENCOUNTER — Inpatient Hospital Stay: Payer: Medicare Other | Attending: Hematology

## 2020-02-01 ENCOUNTER — Inpatient Hospital Stay (HOSPITAL_BASED_OUTPATIENT_CLINIC_OR_DEPARTMENT_OTHER): Payer: Medicare Other | Admitting: Nurse Practitioner

## 2020-02-01 ENCOUNTER — Encounter: Payer: Self-pay | Admitting: Endocrinology

## 2020-02-01 ENCOUNTER — Other Ambulatory Visit: Payer: Self-pay

## 2020-02-01 ENCOUNTER — Other Ambulatory Visit: Payer: Medicare Other

## 2020-02-01 ENCOUNTER — Inpatient Hospital Stay: Payer: Medicare Other

## 2020-02-01 ENCOUNTER — Encounter: Payer: Self-pay | Admitting: Nurse Practitioner

## 2020-02-01 VITALS — BP 126/62 | HR 61 | Ht 63.0 in | Wt 127.8 lb

## 2020-02-01 VITALS — BP 145/88 | HR 67 | Resp 18 | Wt 125.9 lb

## 2020-02-01 VITALS — BP 145/88 | HR 67 | Temp 98.6°F | Resp 18 | Ht 63.0 in | Wt 125.0 lb

## 2020-02-01 DIAGNOSIS — D508 Other iron deficiency anemias: Secondary | ICD-10-CM | POA: Insufficient documentation

## 2020-02-01 DIAGNOSIS — Z95828 Presence of other vascular implants and grafts: Secondary | ICD-10-CM

## 2020-02-01 DIAGNOSIS — I780 Hereditary hemorrhagic telangiectasia: Secondary | ICD-10-CM | POA: Diagnosis present

## 2020-02-01 DIAGNOSIS — Z5112 Encounter for antineoplastic immunotherapy: Secondary | ICD-10-CM | POA: Diagnosis not present

## 2020-02-01 DIAGNOSIS — Z794 Long term (current) use of insulin: Secondary | ICD-10-CM

## 2020-02-01 DIAGNOSIS — K31819 Angiodysplasia of stomach and duodenum without bleeding: Secondary | ICD-10-CM

## 2020-02-01 DIAGNOSIS — D500 Iron deficiency anemia secondary to blood loss (chronic): Secondary | ICD-10-CM | POA: Diagnosis not present

## 2020-02-01 DIAGNOSIS — I10 Essential (primary) hypertension: Secondary | ICD-10-CM

## 2020-02-01 DIAGNOSIS — E1165 Type 2 diabetes mellitus with hyperglycemia: Secondary | ICD-10-CM | POA: Diagnosis not present

## 2020-02-01 LAB — POCT GLYCOSYLATED HEMOGLOBIN (HGB A1C): Hemoglobin A1C: 7.5 % — AB (ref 4.0–5.6)

## 2020-02-01 LAB — CMP (CANCER CENTER ONLY)
ALT: 28 U/L (ref 0–44)
AST: 34 U/L (ref 15–41)
Albumin: 3.5 g/dL (ref 3.5–5.0)
Alkaline Phosphatase: 150 U/L — ABNORMAL HIGH (ref 38–126)
Anion gap: 10 (ref 5–15)
BUN: 14 mg/dL (ref 8–23)
CO2: 22 mmol/L (ref 22–32)
Calcium: 9.9 mg/dL (ref 8.9–10.3)
Chloride: 108 mmol/L (ref 98–111)
Creatinine: 1.27 mg/dL — ABNORMAL HIGH (ref 0.44–1.00)
GFR, Estimated: 43 mL/min — ABNORMAL LOW (ref >60)
Glucose, Bld: 222 mg/dL — ABNORMAL HIGH (ref 70–99)
Potassium: 4.5 mmol/L (ref 3.5–5.1)
Sodium: 140 mmol/L (ref 135–145)
Total Bilirubin: 0.6 mg/dL (ref 0.3–1.2)
Total Protein: 7.2 g/dL (ref 6.5–8.1)

## 2020-02-01 LAB — CBC WITH DIFFERENTIAL (CANCER CENTER ONLY)
Abs Immature Granulocytes: 0.02 10*3/uL (ref 0.00–0.07)
Basophils Absolute: 0 10*3/uL (ref 0.0–0.1)
Basophils Relative: 0 %
Eosinophils Absolute: 0.5 10*3/uL (ref 0.0–0.5)
Eosinophils Relative: 9 %
HCT: 35.8 % — ABNORMAL LOW (ref 36.0–46.0)
Hemoglobin: 12.6 g/dL (ref 12.0–15.0)
Immature Granulocytes: 0 %
Lymphocytes Relative: 13 %
Lymphs Abs: 0.7 10*3/uL (ref 0.7–4.0)
MCH: 30.3 pg (ref 26.0–34.0)
MCHC: 35.2 g/dL (ref 30.0–36.0)
MCV: 86.1 fL (ref 80.0–100.0)
Monocytes Absolute: 0.5 10*3/uL (ref 0.1–1.0)
Monocytes Relative: 9 %
Neutro Abs: 3.6 10*3/uL (ref 1.7–7.7)
Neutrophils Relative %: 69 %
Platelet Count: 135 10*3/uL — ABNORMAL LOW (ref 150–400)
RBC: 4.16 MIL/uL (ref 3.87–5.11)
RDW: 12.7 % (ref 11.5–15.5)
WBC Count: 5.3 10*3/uL (ref 4.0–10.5)
nRBC: 0 % (ref 0.0–0.2)

## 2020-02-01 LAB — TOTAL PROTEIN, URINE DIPSTICK

## 2020-02-01 MED ORDER — ACCU-CHEK SOFTCLIX LANCETS MISC: 1.0000 | Freq: Three times a day (TID) | 3 refills | Status: DC

## 2020-02-01 MED ORDER — ACCU-CHEK GUIDE VI STRP: 1.0000 | ORAL_STRIP | 3 refills | Status: DC | PRN

## 2020-02-01 MED ORDER — SODIUM CHLORIDE 0.9% FLUSH
10.0000 mL | INTRAVENOUS | Status: DC | PRN
  Filled 2020-02-01: qty 10

## 2020-02-01 MED ORDER — HEPARIN SOD (PORK) LOCK FLUSH 100 UNIT/ML IV SOLN
500.0000 [IU] | Freq: Once | INTRAVENOUS | Status: DC | PRN
  Filled 2020-02-01: qty 5

## 2020-02-01 MED ORDER — TRULICITY 1.5 MG/0.5ML ~~LOC~~ SOAJ: SUBCUTANEOUS | 0 refills | Status: DC

## 2020-02-01 NOTE — Patient Instructions (Addendum)
Check blood sugars on waking up 3-4 days a week  Also check blood sugars about 2 hours after meals and do this after different meals by rotation  Recommended blood sugar levels on waking up are 90-130 and about 2 hours after meal is 130-160  Please bring your blood sugar monitor to each visit, thank you  Humalog 3 units before each meals  Stay on 8 Tresiba  Trulicity weekly

## 2020-02-01 NOTE — Progress Notes (Signed)
Patient ID: [name], female   DOB: [birth year], 79 y.o.   MRN: [id number]           Reason for Appointment: Follow-up for Type 2 Diabetes  Referring PCP: Koleen Distance   History of Present Illness:          Date of diagnosis of type 2 diabetes mellitus: 1988       Background history:   Patient has been on insulin for a few years and she is unclear when this was started, according to Epic she was started on Levemir in 2015 Prior to that likely had been on various oral hypoglycemic agents including metformin, glipizide and Tradjenta Her level of control has been somewhat variable but not consistently poor; her A1c has ranged between 5.6 and 7.6  Recent history:   Her A1c is better at 7.5, previously was 8.4  Previous fructosamine 256  INSULIN regimen is:  [place] 8 units in the mornings daily, Humalog 3 units lunch and dinner    Non-insulin hypoglycemic drugs prescribed: Trulicity 3 mg weekly  Current management, blood sugar patterns and problems identified:  She appears to be losing weight and not clear why, apparently has some decreased appetite  She is now using the [employer] brand meter and not clear why she is not using the One Touch and she is not sure if she still has it at home  The date and time on this meter is not programmed and not clear when she is checking her sugars and how often  She is taking her Trulicity every Monday regularly  Her niece is helping her with compliance with insulin  However blood sugar was 222 in the lab after eating cheese sandwich in the morning and she did not take any Humalog that morning  She was told to take 10 units of Tresiba but still is taking 8 units  Fasting readings likely being checked only and these appear to be variable ranging from 128 up to 218  As before is not able to manage her diabetes on her own well without any help from the family  Her niece is helping her with this being regular on her insulin  No  HYPOGLYCEMIA reportedly  She is not able to give a good history at all        Side effects from medications have been: None known     Meal times are:  Breakfast is at variable times between 8 AM-9  Dinner 7 pm  Typical meal intake: Breakfast is Cheerios or raisin Bran               Exercise:  Unable to do any  Glucose monitoring:  Checking up to 4 times a day       Glucometer: One Probation officer.       Blood Glucose readings as above, AVERAGE for 30 days is 155   Dietician visit, most recent: 11/2018  Weight history:  Wt Readings from Last 3 Encounters:  02/01/20 127 lb 12.8 oz (58 kg)  02/01/20 125 lb (56.7 kg)  02/01/20 125 lb 14.4 oz (57.1 kg)    Glycemic control:   Lab Results  Component Value Date   HGBA1C 7.5 (A) 02/01/2020   HGBA1C 8.4 (H) 10/31/2019   HGBA1C 6.0 (A) 09/28/2019   Lab Results  Component Value Date   MICROALBUR 1,289.4 (H) 03/07/2019   LDLCALC 57 01/30/2019   CREATININE 1.27 (H) 02/01/2020   Lab Results  Component Value Date   MICRALBCREAT  5 03/28/2018  ° ° °Lab Results  °Component Value Date  ° FRUCTOSAMINE 404 (H) 10/31/2019  ° FRUCTOSAMINE 256 06/26/2019  ° ° °Office Visit on 02/01/2020  °Component Date Value Ref Range Status  °• Hemoglobin A1C 02/01/2020 7.5* 4.0 - 5.6 % Final  °Appointment on 02/01/2020  °Component Date Value Ref Range Status  °• Sodium 02/01/2020 140  135 - 145 mmol/L Final  °• Potassium 02/01/2020 4.5  3.5 - 5.1 mmol/L Final  °• Chloride 02/01/2020 108  98 - 111 mmol/L Final  °• CO2 02/01/2020 22  22 - 32 mmol/L Final  °• Glucose, Bld 02/01/2020 222* 70 - 99 mg/dL Final  ° Glucose reference range applies only to samples taken after fasting for at least 8 hours.  °• BUN 02/01/2020 14  8 - 23 mg/dL Final  °• Creatinine 02/01/2020 1.27* 0.44 - 1.00 mg/dL Final  °• Calcium 02/01/2020 9.9  8.9 - 10.3 mg/dL Final  °• Total Protein 02/01/2020 7.2  6.5 - 8.1 g/dL Final  °• Albumin 02/01/2020 3.5  3.5 - 5.0 g/dL Final  °• AST 02/01/2020 34   15 - 41 U/L Final  °• ALT 02/01/2020 28  0 - 44 U/L Final  °• Alkaline Phosphatase 02/01/2020 150* 38 - 126 U/L Final  °• Total Bilirubin 02/01/2020 0.6  0.3 - 1.2 mg/dL Final  °• GFR, Estimated 02/01/2020 43* >60 mL/min Final  ° Comment: (NOTE) °Calculated using the CKD-EPI Creatinine Equation (2021) °  °• Anion gap 02/01/2020 10  5 - 15 Final  ° Performed at [facility], [address] [place], [place] [address]  °• WBC Count 02/01/2020 5.3  4.0 - 10.5 K/uL Final  °• RBC 02/01/2020 4.16  3.87 - 5.11 MIL/uL Final  °• Hemoglobin 02/01/2020 12.6  12.0 - 15.0 g/dL Final  °• HCT 02/01/2020 35.8* 36.0 - 46.0 % Final  °• MCV 02/01/2020 86.1  80.0 - 100.0 fL Final  °• MCH 02/01/2020 30.3  26.0 - 34.0 pg Final  °• MCHC 02/01/2020 35.2  30.0 - 36.0 g/dL Final  °• RDW 02/01/2020 12.7  11.5 - 15.5 % Final  °• Platelet Count 02/01/2020 135* 150 - 400 K/uL Final  °• nRBC 02/01/2020 0.0  0.0 - 0.2 % Final  °• Neutrophils Relative % 02/01/2020 69  % Final  °• Neutro Abs 02/01/2020 3.6  1.7 - 7.7 K/uL Final  °• Lymphocytes Relative 02/01/2020 13  % Final  °• Lymphs Abs 02/01/2020 0.7  0.7 - 4.0 K/uL Final  °• Monocytes Relative 02/01/2020 9  % Final  °• Monocytes Absolute 02/01/2020 0.5  0.1 - 1.0 K/uL Final  °• Eosinophils Relative 02/01/2020 9  % Final  °• Eosinophils Absolute 02/01/2020 0.5  0.0 - 0.5 K/uL Final  °• Basophils Relative 02/01/2020 0  % Final  °• Basophils Absolute 02/01/2020 0.0  0.0 - 0.1 K/uL Final  °• Immature Granulocytes 02/01/2020 0  % Final  °• Abs Immature Granulocytes 02/01/2020 0.02  0.00 - 0.07 K/uL Final  ° Performed at [facility], [address] [place], [place] [address]  °• Protein, ur 02/01/2020 TRACE* NEGATIVE mg/dL Final  ° Performed at [facility], [address] [place], [place] [address]  ° ° °Allergies as of 02/01/2020   °   Reactions  ° Aspirin Nausea And Vomiting  °  °  °Medication List  °  °  ° Accurate as  of February 01, 2020  1:55 PM.   If you have any questions, ask your nurse or doctor.        STOP taking these medications   OneTouch Verio w/Device Kit Stopped by: Tora Duck, CMA   oxycodone-acetaminophen 2.5-325 MG tablet Commonly known as: PERCOCET Stopped by: Alla Feeling, NP     TAKE these medications   Accu-Chek Guide test strip Generic drug: glucose blood 1 each by Other route as needed for other. Use as instructed to check blood sugar 3 times per day What changed: Another medication with the same name was removed. Continue taking this medication, and follow the directions you see here. Changed by: Tora Duck, CMA   amLODipine 10 MG tablet Commonly known as: NORVASC Take 1 tablet (10 mg total) by mouth daily.   B-D UF III MINI PEN NEEDLES 31G X 5 MM Misc Generic drug: Insulin Pen Needle Use as directed to inject insulin   cetaphil lotion Apply 1 application topically as needed for dry skin.   donepezil 10 MG tablet Commonly known as: ARICEPT Take 1 tablet (10 mg total) by mouth at bedtime.   Ferrous Fumarate 324 (106 Fe) MG Tabs tablet Commonly known as: Ferrocite Take 1 tablet (106 mg of iron total) by mouth every other day.   gabapentin 100 MG capsule Commonly known as: Neurontin Take 1 capsule (100 mg total) by mouth 2 (two) times daily.   insulin lispro 100 UNIT/ML KwikPen Commonly known as: HUMALOG Inject 3 U with lunch and dinner. If eating a breakfast heavy in carbs such as grits or hash browns, also inject 3 U with breakfast.   lactulose 10 GM/15ML solution Commonly known as: CHRONULAC TAKE 15 ML BY MOUTH TWO TIMES DAILY   levETIRAcetam 500 MG tablet Commonly known as: KEPPRA Take 1 tablet (500 mg total) by mouth 2 (two) times daily.   Medical Compression Stockings Misc Wear as much as possible while awake to reduce swelling   memantine 10 MG tablet Commonly known as: NAMENDA Take 1 tablet (10 mg total) by mouth 2 (two) times  daily.   OneTouch Delica Lancets Fine Misc Check blood sugar up to 4 times a day What changed: Another medication with the same name was removed. Continue taking this medication, and follow the directions you see here. Changed by: Tora Duck, CMA   oxymetazoline 0.05 % nasal spray Commonly known as: AFRIN Place 1 spray into both nostrils 2 (two) times daily as needed (Epistaxis).   pantoprazole 40 MG tablet Commonly known as: PROTONIX Take 1 tablet by mouth once daily   Potassium Chloride ER 20 MEQ Tbcr Take 20 mEq by mouth 2 (two) times daily.   torsemide 20 MG tablet Commonly known as: DEMADEX Take 1 tablet (20 mg total) by mouth daily.   Tyler Aas FlexTouch 100 UNIT/ML FlexTouch Pen Generic drug: insulin degludec Inject 10 Units into the skin daily. What changed: how much to take   triamcinolone ointment 0.5 % Commonly known as: KENALOG APPLY 1 APPLICATION TOPICALLY TO RASH TWICE DAILY FOR ITCHING   Trulicity 1.5 ZO/1.0RU Sopn Generic drug: Dulaglutide INJECT 1.5 MG INTO THE SKIN ONCE A WEEK. What changed:   See the new instructions.  Another medication with the same name was removed. Continue taking this medication, and follow the directions you see here. Changed by: Elayne Snare, MD       Allergies:  Allergies  Allergen Reactions   Aspirin Nausea And Vomiting    Past Medical History:  Diagnosis Date   Chronic anemia  Chronic diastolic CHF (congestive heart failure) ([place]) 10/03/2013   Chronic GI bleeding    /notes 11/29/2014   Family history of anesthesia complication    "niece has a hard time coming out" (09/15/2012)   Frequent nosebleeds    chronic   Gastric AV malformation    /notes 11/29/2014   GERD (gastroesophageal reflux disease)    Heart murmur 04/01/2017   Moderate AVSC on echo 09/2016   History of blood transfusion "several"   History of epistaxis    HTN (hypertension), benign 03/02/2012   Hyperlipidemia    Iron deficiency  anemia    chronic infusions"   Lichen planus    Both lower extremities   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    Past Surgical History:  Procedure Laterality Date   CATARACT EXTRACTION     "I think it was just one eye"   ESOPHAGOGASTRODUODENOSCOPY  02/26/2011   Procedure: ESOPHAGOGASTRODUODENOSCOPY (EGD);  Surgeon: [name];  [facility];  Service: Endoscopy;  Laterality: N/A;   ESOPHAGOGASTRODUODENOSCOPY N/A 11/08/2012   Procedure: ESOPHAGOGASTRODUODENOSCOPY (EGD);  Surgeon: [name];  [facility];  Service: Endoscopy;  Laterality: N/A;   ESOPHAGOGASTRODUODENOSCOPY N/A 10/04/2013   Procedure: ESOPHAGOGASTRODUODENOSCOPY (EGD);  Surgeon: [name];  [facility];  Service: Endoscopy;  Laterality: N/A;  with APC on stand-by   ESOPHAGOGASTRODUODENOSCOPY N/A 07/06/2014   Procedure: ESOPHAGOGASTRODUODENOSCOPY (EGD);  Surgeon: [name];  [facility];  Service: Endoscopy;  Laterality: N/A;   ESOPHAGOGASTRODUODENOSCOPY N/A 09/05/2014   Procedure: ESOPHAGOGASTRODUODENOSCOPY (EGD);  Surgeon: JaLaurence SpatesMD;  [facility];  Service: Endoscopy;  Laterality: N/A;  APC on standby to control bleeding   ESOPHAGOGASTRODUODENOSCOPY N/A 11/29/2014   Procedure: ESOPHAGOGASTRODUODENOSCOPY (EGD);  Surgeon: [name];  [facility];  Service: Endoscopy;  Laterality: N/A;   ESOPHAGOGASTRODUODENOSCOPY N/A 09/28/2015   Procedure: ESOPHAGOGASTRODUODENOSCOPY (EGD);  Surgeon: [name];  [facility];  Service: Endoscopy;  Laterality: N/A;   ESOPHAGOGASTRODUODENOSCOPY (EGD) WITH  PROPOFOL N/A 12/04/2017   Procedure: ESOPHAGOGASTRODUODENOSCOPY (EGD) WITH PROPOFOL;  Surgeon: [name];  [facility] Service: Endoscopy;  Laterality: N/A;   ESOPHAGOGASTRODUODENOSCOPY ENDOSCOPY  08/19/2006   with laser treatment   HOT HEMOSTASIS N/A 07/06/2014   Procedure: HOT HEMOSTASIS (ARGON PLASMA COAGULATION/BICAP);  Surgeon: [name];  [facility];  Service: Endoscopy;  Laterality: N/A;   HOT HEMOSTASIS N/A 09/28/2015   Procedure: HOT HEMOSTASIS (ARGON PLASMA COAGULATION/BICAP);  Surgeon: [name];  [facility];  Service: Endoscopy;  Laterality: N/A;   HOT HEMOSTASIS N/A 12/04/2017   Procedure: HOT HEMOSTASIS (ARGON PLASMA COAGULATION/BICAP);  Surgeon: [name];  [facility]: [facility];  Laterality: N/A;   NASAL HEMORRHAGE CONTROL     "for bleeding"    SAVORY DILATION  02/26/2011   Procedure: SAVORY DILATION;  Surgeon: [name];  [name];  Service: Endoscopy;  Laterality: N/A;  c-arm needed   SUBMUCOSAL INJECTION  12/04/2017   Procedure: SUBMUCOSAL INJECTION;  Surgeon: [name];  [facility];  Service: Endoscopy;;    Family  History  °Problem Relation Age of Onset  °• Stroke Father   °• Healthy Mother   °• Breast cancer Other   °• Diabetes Niece   °• Malignant hyperthermia Neg Hx   °• Seizures Neg Hx   ° ° °Social History:  reports that she quit smoking about 49 years ago. Her smoking use included cigarettes. She has a 20.00 pack-year smoking history. She has never used smokeless tobacco. She reports that she does not drink alcohol and does not use drugs. ° ° °Review of Systems ° ° °Lipid history: As below ° °  °Lab Results  °Component Value Date  ° CHOL 133 01/30/2019  ° HDL 41.30 01/30/2019  ° LDLCALC 57 01/30/2019  ° TRIG 176.0 (H) 01/30/2019  ° CHOLHDL 3 01/30/2019  °     °    °Hypertension: taking amlodipine from her PCP ° °BP Readings from Last  3 Encounters:  °02/01/20 126/62  °02/01/20 (!) 145/88  °02/01/20 (!) 145/88  ° °Not clear why she has not recently renal dysfunction °She is on Demadex ° °Lab Results  °Component Value Date  ° CREATININE 1.27 (H) 02/01/2020  ° CREATININE 1.69 (H) 01/05/2020  ° CREATININE 1.69 (H) 11/10/2019  ° ° °Most recent eye exam was in 07/2018 ° °Most recent foot exam: 06/2018 by podiatrist with no evidence of sensory loss or reduction in sensation ° ° °Complications of diabetes: Reportedly has retinopathy, currently without a foot exam not clear if she has neuropathy.  Has history of microalbuminuria ° °LABS: ° °Office Visit on 02/01/2020  °Component Date Value Ref Range Status  °• Hemoglobin A1C 02/01/2020 7.5* 4.0 - 5.6 % Final  °Appointment on 02/01/2020  °Component Date Value Ref Range Status  °• Sodium 02/01/2020 140  135 - 145 mmol/L Final  °• Potassium 02/01/2020 4.5  3.5 - 5.1 mmol/L Final  °• Chloride 02/01/2020 108  98 - 111 mmol/L Final  °• CO2 02/01/2020 22  22 - 32 mmol/L Final  °• Glucose, Bld 02/01/2020 222* 70 - 99 mg/dL Final  ° Glucose reference range applies only to samples taken after fasting for at least 8 hours.  °• BUN 02/01/2020 14  8 - 23 mg/dL Final  °• Creatinine 02/01/2020 1.27* 0.44 - 1.00 mg/dL Final  °• Calcium 02/01/2020 9.9  8.9 - 10.3 mg/dL Final  °• Total Protein 02/01/2020 7.2  6.5 - 8.1 g/dL Final  °• Albumin 02/01/2020 3.5  3.5 - 5.0 g/dL Final  °• AST 02/01/2020 34  15 - 41 U/L Final  °• ALT 02/01/2020 28  0 - 44 U/L Final  °• Alkaline Phosphatase 02/01/2020 150* 38 - 126 U/L Final  °• Total Bilirubin 02/01/2020 0.6  0.3 - 1.2 mg/dL Final  °• GFR, Estimated 02/01/2020 43* >60 mL/min Final  ° Comment: (NOTE) °Calculated using the CKD-EPI Creatinine Equation (2021) °  °• Anion gap 02/01/2020 10  5 - 15 Final  ° Performed at [facility], [address] [place], [place] [address]  °• WBC Count 02/01/2020 5.3  4.0 - 10.5 K/uL Final  °• RBC 02/01/2020 4.16  3.87 - 5.11  MIL/uL Final  °• Hemoglobin 02/01/2020 12.6  12.0 - 15.0 g/dL Final  °• HCT 02/01/2020 35.8* 36.0 - 46.0 % Final  °• MCV 02/01/2020 86.1  80.0 - 100.0 fL Final  °• MCH 02/01/2020 30.3  26.0 - 34.0 pg Final  °• MCHC 02/01/2020 35.2  30.0 - 36.0 g/dL Final  °• RDW 02/01/2020 12.7    11.5 - 15.5 % Final  °• Platelet Count 02/01/2020 135* 150 - 400 K/uL Final  °• nRBC 02/01/2020 0.0  0.0 - 0.2 % Final  °• Neutrophils Relative % 02/01/2020 69  % Final  °• Neutro Abs 02/01/2020 3.6  1.7 - 7.7 K/uL Final  °• Lymphocytes Relative 02/01/2020 13  % Final  °• Lymphs Abs 02/01/2020 0.7  0.7 - 4.0 K/uL Final  °• Monocytes Relative 02/01/2020 9  % Final  °• Monocytes Absolute 02/01/2020 0.5  0.1 - 1.0 K/uL Final  °• Eosinophils Relative 02/01/2020 9  % Final  °• Eosinophils Absolute 02/01/2020 0.5  0.0 - 0.5 K/uL Final  °• Basophils Relative 02/01/2020 0  % Final  °• Basophils Absolute 02/01/2020 0.0  0.0 - 0.1 K/uL Final  °• Immature Granulocytes 02/01/2020 0  % Final  °• Abs Immature Granulocytes 02/01/2020 0.02  0.00 - 0.07 K/uL Final  ° Performed at [facility], [address] [place], [place] [address]  °• Protein, ur 02/01/2020 TRACE* NEGATIVE mg/dL Final  ° Performed at [facility], [address] [place], [place] [address]  ° ° °Physical Examination: ° °BP 126/62    Pulse 61    Ht 5' 3" (1.6 m)    Wt 127 lb 12.8 oz (58 kg)    LMP  (LMP Unknown)    SpO2 96%    BMI 22.64 kg/m²  ° ° °ASSESSMENT: ° °Diabetes type 2 on insulin ° °See history of present illness for detailed discussion of current diabetes management, blood sugar patterns and problems identified ° °A1c is 7.5 and relatively better ° °She is currently on Trulicity 3 mg, Humalog 3 units nightly after meals and Tresiba 8 units once a day °Because of her multiple medical problems and age her target for blood sugars needs to be relaxed ° °She is unable to manage her diabetes on her own and difficult to get a good  history of how she is doing her insulin, monitoring of glucose °Blood sugar monitoring has been with a generic monitor which cannot be evaluated properly today °She has lost weight for unknown reasons ° °HYPERTENSION: Consistently well controlled, Labs being monitored by hematology clinic ° °PLAN:   ° °Reduce Trulicity to 1.5 mg to avoid any further weight loss °Tresiba unchanged at 8 units ° ° °Take Humalog 3 units before each meal unless she is eating only a snack and make sure she takes it before starting to eat °She will bring her meter for download on each visit °Showed her how to use the Accu-Chek meter which is likely covered by her insurance better and given her a sample °Try to check blood sugars by rotation at different times and not just in the morning ° °Follow-up in 1 about a month ° ° ° °Patient Instructions  °Check blood sugars on waking up 3-4 days a week ° °Also check blood sugars about 2 hours after meals and do this after different meals by rotation ° °Recommended blood sugar levels on waking up are 90-130 and about 2 hours after meal is 130-160 ° °Please bring your blood sugar monitor to each visit, thank you ° °Humalog 3 units before each meals ° °Stay on 8 Tresiba ° °Trulicity weekly ° ° ° ° °  ° ° °  °02/01/2020, 1:55 PM  ° °Note: This office note was prepared with Dragon voice recognition system technology. Any transcriptional errors that result from this process are unintentional. ° °

## 2020-02-05 ENCOUNTER — Telehealth: Payer: Self-pay | Admitting: Nurse Practitioner

## 2020-02-05 NOTE — Telephone Encounter (Signed)
Scheduled appointments per 12/23 los. Spoke to patient's niece who is aware of appointments dates and times. Per niece's request I am mailing an updated calendar to address on file.

## 2020-02-07 ENCOUNTER — Ambulatory Visit: Payer: Medicare Other | Admitting: Hematology

## 2020-02-08 ENCOUNTER — Other Ambulatory Visit: Payer: Self-pay | Admitting: Oncology

## 2020-02-09 ENCOUNTER — Other Ambulatory Visit: Payer: Self-pay

## 2020-02-09 ENCOUNTER — Inpatient Hospital Stay: Payer: Medicare Other

## 2020-02-09 VITALS — BP 132/78 | HR 56 | Temp 98.0°F | Resp 18

## 2020-02-09 DIAGNOSIS — Z5112 Encounter for antineoplastic immunotherapy: Secondary | ICD-10-CM | POA: Diagnosis not present

## 2020-02-09 DIAGNOSIS — I780 Hereditary hemorrhagic telangiectasia: Secondary | ICD-10-CM

## 2020-02-09 MED ORDER — SODIUM CHLORIDE 0.9% FLUSH
10.0000 mL | INTRAVENOUS | Status: DC | PRN
Start: 2020-02-09 — End: 2020-02-09
  Administered 2020-02-09: 10 mL
  Filled 2020-02-09: qty 10

## 2020-02-09 MED ORDER — HEPARIN SOD (PORK) LOCK FLUSH 100 UNIT/ML IV SOLN
500.0000 [IU] | Freq: Once | INTRAVENOUS | Status: AC | PRN
  Administered 2020-02-09: 500 [IU]
  Filled 2020-02-09: qty 5

## 2020-02-09 MED ORDER — SODIUM CHLORIDE 0.9 % IV SOLN
4.6000 mg/kg | Freq: Once | INTRAVENOUS | Status: AC
  Administered 2020-02-09: 300 mg via INTRAVENOUS
  Filled 2020-02-09: qty 12

## 2020-02-09 MED ORDER — SODIUM CHLORIDE 0.9 % IV SOLN
Freq: Once | INTRAVENOUS | Status: AC
  Filled 2020-02-09: qty 250

## 2020-02-12 ENCOUNTER — Other Ambulatory Visit: Payer: Self-pay | Admitting: Internal Medicine

## 2020-02-12 DIAGNOSIS — E1165 Type 2 diabetes mellitus with hyperglycemia: Secondary | ICD-10-CM

## 2020-02-12 DIAGNOSIS — Z794 Long term (current) use of insulin: Secondary | ICD-10-CM

## 2020-02-16 ENCOUNTER — Other Ambulatory Visit: Payer: Self-pay | Admitting: *Deleted

## 2020-02-16 MED ORDER — ACCU-CHEK GUIDE VI STRP: 1.0000 | ORAL_STRIP | 3 refills | Status: DC | PRN

## 2020-02-19 ENCOUNTER — Other Ambulatory Visit: Payer: Self-pay | Admitting: *Deleted

## 2020-02-19 MED ORDER — ACCU-CHEK GUIDE VI STRP: 1.0000 | ORAL_STRIP | 3 refills | Status: DC | PRN

## 2020-02-27 ENCOUNTER — Ambulatory Visit: Payer: Medicare Other | Admitting: Endocrinology

## 2020-02-29 ENCOUNTER — Other Ambulatory Visit: Payer: Medicare Other

## 2020-03-12 ENCOUNTER — Other Ambulatory Visit: Payer: Self-pay | Admitting: Endocrinology

## 2020-03-12 DIAGNOSIS — Z794 Long term (current) use of insulin: Secondary | ICD-10-CM

## 2020-03-12 DIAGNOSIS — E1165 Type 2 diabetes mellitus with hyperglycemia: Secondary | ICD-10-CM

## 2020-03-13 ENCOUNTER — Ambulatory Visit (INDEPENDENT_AMBULATORY_CARE_PROVIDER_SITE_OTHER): Payer: Medicare Other | Admitting: Endocrinology

## 2020-03-13 ENCOUNTER — Encounter: Payer: Self-pay | Admitting: Endocrinology

## 2020-03-13 ENCOUNTER — Other Ambulatory Visit: Payer: Self-pay

## 2020-03-13 VITALS — BP 102/58 | HR 59 | Ht 63.0 in | Wt 133.8 lb

## 2020-03-13 DIAGNOSIS — E1165 Type 2 diabetes mellitus with hyperglycemia: Secondary | ICD-10-CM

## 2020-03-13 DIAGNOSIS — I952 Hypotension due to drugs: Secondary | ICD-10-CM | POA: Diagnosis not present

## 2020-03-13 DIAGNOSIS — Z794 Long term (current) use of insulin: Secondary | ICD-10-CM | POA: Diagnosis not present

## 2020-03-13 MED ORDER — HUMALOG MIX 75/25 KWIKPEN (75-25) 100 UNIT/ML ~~LOC~~ SUPN: 10.0000 [IU] | PEN_INJECTOR | Freq: Every day | SUBCUTANEOUS | 1 refills | Status: DC

## 2020-03-13 NOTE — Progress Notes (Signed)
Patient ID: [name], female   DOB: Mar 11, 1940, 80 y.o.   MRN: [id number]           Reason for Appointment: Follow-up for Type 2 Diabetes  Referring PCP: Koleen Distance   History of Present Illness:          Date of diagnosis of type 2 diabetes mellitus: 1988       Background history:   Patient has been on insulin for a few years and she is unclear when this was started, according to Epic she was started on Levemir in 2015 Prior to that likely had been on various oral hypoglycemic agents including metformin, glipizide and Tradjenta Her level of control has been somewhat variable but not consistently poor; her A1c has ranged between 5.6 and 7.6  Recent history:   Her A1c is last at 7.5, previously was 8.4  Previous fructosamine 256  INSULIN regimen is:  [place] 8 units in the mornings daily, Humalog 4 units mostly before dinner    Non-insulin hypoglycemic drugs prescribed: Trulicity 1.5 mg weekly  Current management, blood sugar patterns and problems identified:  She was losing weight and was told to reduce Trulicity to 1.5 mg  With this her weight has come up  She is checking blood sugars with the Accu-Chek meter  As before she checks blood sugars numerous times during the day  However blood sugars generally appear to be consistently high in the afternoons and evenings  Blood sugars are mostly near normal in the mornings but usually variable midmorning on early afternoon  This likely indicates that she does not take Humalog at breakfast or lunch usually  She is not able to manage her insulin on her own except under supervision  Tyler Aas is being given in the morning under the supervision of a CNA  Hypoglycemia has occurred about 5 times in the last month, sometimes midmorning and once after dinner  Her niece thinks that her appetite is fairly good  Since she cannot cope she is usually eating cereal in the morning  No nausea with current dose of Trulicity which  she takes every Monday        Side effects from medications have been: None known     Meal times are:  Breakfast is at variable times between 8 AM-9  Dinner 7 pm  Typical meal intake: Breakfast is Cheerios or raisin Bran               Exercise:  Unable to do any  Glucose monitoring:  Checking up to 6 times a day       Glucometer: Accu-Chek    Blood Glucose readings with readings for the last week and averages for the last 30 days   PRE-MEAL Fasting Lunch Dinner Bedtime Overall  Glucose range:  143-266  104-268  208-326    Mean/median:  158  165   233  219+/-83   POST-MEAL PC Breakfast PC Lunch PC Dinner  Glucose range:   89-317  304-330  Mean/median:    266      Dietician visit, most recent: 11/2018  Weight history:  Wt Readings from Last 3 Encounters:  03/13/20 133 lb 12.8 oz (60.7 kg)  02/01/20 127 lb 12.8 oz (58 kg)  02/01/20 125 lb (56.7 kg)    Glycemic control:   Lab Results  Component Value Date   HGBA1C 7.5 (A) 02/01/2020   HGBA1C 8.4 (H) 10/31/2019   HGBA1C 6.0 (A) 09/28/2019   Lab Results  Component  Value Date   MICROALBUR 1,289.4 (H) 03/07/2019   LDLCALC 57 01/30/2019   CREATININE 1.27 (H) 02/01/2020   Lab Results  Component Value Date   MICRALBCREAT 5 03/28/2018    Lab Results  Component Value Date   FRUCTOSAMINE 404 (H) 10/31/2019   FRUCTOSAMINE 256 06/26/2019    No visits with results within 1 Week(s) from this visit.  Latest known visit with results is:  Office Visit on 02/01/2020  Component Date Value Ref Range Status  . Hemoglobin A1C 02/01/2020 7.5* 4.0 - 5.6 % Final    Allergies as of 03/13/2020      Reactions   Aspirin Nausea And Vomiting      Medication List       Accurate as of March 13, 2020 11:59 PM. If you have any questions, ask your nurse or doctor.        STOP taking these medications   Tresiba FlexTouch 100 UNIT/ML FlexTouch Pen Generic drug: insulin degludec Stopped by: Elayne Snare, MD     TAKE these  medications   Accu-Chek Guide test strip Generic drug: glucose blood 1 each by Other route as needed for other. Use as instructed to check blood sugar 3 times per day Dx code E11.65   Accu-Chek Softclix Lancets lancets 1 each by Other route 3 (three) times daily. Use as instructed to check blood sugar 3 times per day dx code E11.65   amLODipine 10 MG tablet Commonly known as: NORVASC Take 1 tablet (10 mg total) by mouth daily.   B-D UF III MINI PEN NEEDLES 31G X 5 MM Misc Generic drug: Insulin Pen Needle Use as directed to inject insulin   cetaphil lotion Apply 1 application topically as needed for dry skin.   donepezil 10 MG tablet Commonly known as: ARICEPT Take 1 tablet (10 mg total) by mouth at bedtime.   Ferrous Fumarate 324 (106 Fe) MG Tabs tablet Commonly known as: Ferrocite Take 1 tablet (106 mg of iron total) by mouth every other day.   gabapentin 100 MG capsule Commonly known as: Neurontin Take 1 capsule (100 mg total) by mouth 2 (two) times daily.   HumaLOG Mix 75/25 KwikPen (75-25) 100 UNIT/ML Kwikpen Generic drug: Insulin Lispro Prot & Lispro Inject 10 Units into the skin daily before breakfast. Started by: Elayne Snare, MD   insulin lispro 100 UNIT/ML KwikPen Commonly known as: HUMALOG Inject 3 U with lunch and dinner. If eating a breakfast heavy in carbs such as grits or hash browns, also inject 3 U with breakfast. What changed: additional instructions   lactulose 10 GM/15ML solution Commonly known as: CHRONULAC TAKE 15 ML BY MOUTH TWO TIMES DAILY   levETIRAcetam 500 MG tablet Commonly known as: KEPPRA Take 1 tablet (500 mg total) by mouth 2 (two) times daily.   Medical Compression Stockings Misc Wear as much as possible while awake to reduce swelling   memantine 10 MG tablet Commonly known as: NAMENDA Take 1 tablet (10 mg total) by mouth 2 (two) times daily.   oxymetazoline 0.05 % nasal spray Commonly known as: AFRIN Place 1 spray into both  nostrils 2 (two) times daily as needed (Epistaxis).   pantoprazole 40 MG tablet Commonly known as: PROTONIX Take 1 tablet by mouth once daily   Potassium Chloride ER 20 MEQ Tbcr Take 20 mEq by mouth 2 (two) times daily.   torsemide 20 MG tablet Commonly known as: DEMADEX Take 1 tablet (20 mg total) by mouth daily.   triamcinolone ointment  0.5 % Commonly known as: KENALOG APPLY 1 APPLICATION TOPICALLY TO RASH TWICE DAILY FOR ITCHING   Trulicity 1.5 ST/4.1DQ Sopn Generic drug: Dulaglutide INJECT 1.5MG  SUBCUTANEOUSLY EVERY WEEK ON THURSDAY       Allergies:  Allergies  Allergen Reactions  . Aspirin Nausea And Vomiting    Past Medical History:  Diagnosis Date  . Chronic anemia   . Chronic diastolic CHF (congestive heart failure) ([place]) 10/03/2013  . Chronic GI bleeding    [name] 11/29/2014  . Family history of anesthesia complication    "niece has a hard time coming out" (09/15/2012)  . Frequent nosebleeds    chronic  . Gastric AV malformation    [name] 11/29/2014  . GERD (gastroesophageal reflux disease)   . 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 History of blood transfusion "several"  . History of epistaxis   . HTN (hypertension), benign 03/02/2012  . Hyperlipidemia   . Iron deficiency anemia    chronic infusions"  . Lichen planus    Both lower extremities  . 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 Overgrown toenails 03/18/2017  . Pneumonia 1990's X 2  . Pulmonary HTN ([place]) 04/01/2017   PASP 59mmHg on echo 09/2016 and 40mmHg by echo 2019  . Seizures ([place]) 09/2014  . Symptomatic anemia 11/29/2014  . Telangiectasia    Gastric   . 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    Past Surgical History:  Procedure Laterality Date  . CATARACT EXTRACTION     "I think it was just one eye"  . ESOPHAGOGASTRODUODENOSCOPY  02/26/2011   Procedure: ESOPHAGOGASTRODUODENOSCOPY (EGD);  Surgeon: [name], MD;  Location: [name];  Service: Endoscopy;  Laterality: N/A;  . ESOPHAGOGASTRODUODENOSCOPY N/A 11/08/2012   Procedure: ESOPHAGOGASTRODUODENOSCOPY (EGD);  Surgeon: [name], MD;  Location: [name];  Service: Endoscopy;  Laterality: N/A;  . ESOPHAGOGASTRODUODENOSCOPY N/A 10/04/2013   Procedure: ESOPHAGOGASTRODUODENOSCOPY (EGD);  Surgeon: Winfield Cunas., MD;  Location: [name];  Service: Endoscopy;  Laterality: N/A;  with APC on stand-by  . ESOPHAGOGASTRODUODENOSCOPY N/A 07/06/2014   Procedure: ESOPHAGOGASTRODUODENOSCOPY (EGD);  Surgeon: Clarene Essex, MD;  Location: [name];  Service: Endoscopy;  Laterality: N/A;  . ESOPHAGOGASTRODUODENOSCOPY N/A 09/05/2014   Procedure: ESOPHAGOGASTRODUODENOSCOPY (EGD);  Surgeon: Laurence Spates, MD;  Location: [name];  Service: Endoscopy;  Laterality: N/A;  APC on standby to control bleeding  . ESOPHAGOGASTRODUODENOSCOPY N/A 11/29/2014   Procedure: ESOPHAGOGASTRODUODENOSCOPY (EGD);  Surgeon: [name], MD;  [facility];  Service: Endoscopy;  Laterality: N/A;  . ESOPHAGOGASTRODUODENOSCOPY N/A 09/28/2015   Procedure: ESOPHAGOGASTRODUODENOSCOPY (EGD);  Surgeon: Clarene Essex, MD;  Location: [facility] ENDOSCOPY;  Service: Endoscopy;  Laterality: N/A;  . ESOPHAGOGASTRODUODENOSCOPY (EGD) WITH PROPOFOL N/A 12/04/2017   Procedure: ESOPHAGOGASTRODUODENOSCOPY (EGD) WITH PROPOFOL;  Surgeon: [name], MD;  Location: [facility];  Service: Endoscopy;  Laterality: N/A;  . ESOPHAGOGASTRODUODENOSCOPY ENDOSCOPY  08/19/2006   with laser treatment  . HOT HEMOSTASIS N/A 07/06/2014   Procedure: HOT HEMOSTASIS (ARGON PLASMA COAGULATION/BICAP);  Surgeon: Clarene Essex, MD;  Location: [name];  Service: Endoscopy;  Laterality: N/A;  . HOT HEMOSTASIS N/A 09/28/2015   Procedure: HOT HEMOSTASIS (ARGON PLASMA COAGULATION/BICAP);  Surgeon: Clarene Essex, MD;  Location: [facility] ENDOSCOPY;  Service: Endoscopy;  Laterality: N/A;  . HOT HEMOSTASIS N/A 12/04/2017    Procedure: HOT HEMOSTASIS (ARGON PLASMA COAGULATION/BICAP);  Surgeon: [name], MD;  Location: [facility];  Service: Endoscopy;  Laterality: N/A;  . NASAL  HEMORRHAGE CONTROL     "for bleeding"   . SAVORY DILATION  02/26/2011   Procedure: SAVORY DILATION;  Surgeon: [name], MD;  Location: [name];  Service: Endoscopy;  Laterality: N/A;  c-arm needed  . SUBMUCOSAL INJECTION  12/04/2017   Procedure: SUBMUCOSAL INJECTION;  Surgeon: [name], MD;  Location: [facility] ENDOSCOPY;  Service: Endoscopy;;    Family History  Problem Relation Age of Onset  . Stroke Father   . Healthy Mother   . Breast cancer Other   . Diabetes Niece   . Malignant hyperthermia Neg Hx   . Seizures Neg Hx     Social History:  reports that she quit smoking about 49 years ago. Her smoking use included cigarettes. She has a 20.00 pack-year smoking history. She has never used smokeless tobacco. She reports that she does not drink alcohol and does not use drugs.   Review of Systems   Lipid history: As below    Lab Results  Component Value Date   CHOL 133 01/30/2019   HDL 41.30 01/30/2019   LDLCALC 57 01/30/2019   TRIG 176.0 (H) 01/30/2019   CHOLHDL 3 01/30/2019           Hypertension: taking amlodipine 10 mg from her PCP but does not appear to have had regular follow-up Not feeling dizzy recently but blood pressure is unusually low  BP Readings from Last 3 Encounters:  03/13/20 (!) 102/58  02/09/20 132/78  02/01/20 126/62   Renal function fluctuates: She is on Demadex  Lab Results  Component Value Date   CREATININE 1.27 (H) 02/01/2020   CREATININE 1.69 (H) 01/05/2020   CREATININE 1.69 (H) 11/10/2019    Most recent eye exam was in 07/2018  Most recent foot exam: 06/2018 by podiatrist with no evidence of sensory loss or reduction in sensation   Complications of diabetes: Reportedly has retinopathy, currently without a foot exam not clear if she has neuropathy.  Has history of  microalbuminuria  LABS:  No visits with results within 1 Week(s) from this visit.  Latest known visit with results is:  Office Visit on 02/01/2020  Component Date Value Ref Range Status  . Hemoglobin A1C 02/01/2020 7.5* 4.0 - 5.6 % Final    Physical Examination:  BP (!) 102/58 (Patient Position: Sitting)   Pulse (!) 59   Ht 5\' 3"  (1.6 m)   Wt 133 lb 12.8 oz (60.7 kg)   LMP  (LMP Unknown)   SpO2 96%   BMI 23.70 kg/m    ASSESSMENT:  Diabetes type 2 on insulin  See history of present illness for detailed discussion of current diabetes management, blood sugar patterns and problems identified  A1c is 7.5 on the last visit  She is currently on Trulicity 1.5 mg, Humalog 4 units before dinner mostly and Tresiba 8 units once a day Because of her multiple medical problems and age along with dementia her level of control needs to be flexible  She is unable to manage her insulin injections and diet on her own and only gets help in the evening from her niece Likely from not taking any insulin to cover her lunch she has usually higher readings in the afternoons and evenings and these appear to be progressively higher late evening  HYPERTENSION: Blood pressure appears to be relatively low and is on amlodipine only reportedly    PLAN:    Continue 1.5 mg Trulicity We will stop [place] and start Humalog mix 10 units before breakfast Continue  Humalog separately at suppertime but go up to 5 units If she is able to take insulin to cover her lunch also that would be desirable but unlikely patient will do that Not clear if she will be eligible for CGM since she is likely only going to take 2 injections a day Follow-up with diabetes educator Discussed blood sugar targets and as long as blood sugars are averaging about 150-180 would be adequate for Need to add some protein for breakfast  Reduce amlodipine to half tablet and follow-up with PCP   Patient Instructions  Change Tyler Aas to  humalog mix 10 units in am preferrably before breakfast             Elayne Snare 03/14/2020, 9:03 AM   Note: This office note was prepared with Dragon voice recognition system technology. Any transcriptional errors that result from this process are unintentional.

## 2020-03-13 NOTE — Patient Instructions (Addendum)
Change Tresiba to humalog mix 10 units in am preferrably before breakfast

## 2020-03-15 ENCOUNTER — Other Ambulatory Visit: Payer: Self-pay | Admitting: Internal Medicine

## 2020-03-28 ENCOUNTER — Other Ambulatory Visit: Payer: Medicare Other

## 2020-03-28 ENCOUNTER — Inpatient Hospital Stay: Payer: Medicare Other | Attending: Hematology

## 2020-03-28 ENCOUNTER — Inpatient Hospital Stay: Payer: Medicare Other

## 2020-03-28 ENCOUNTER — Other Ambulatory Visit: Payer: Self-pay

## 2020-03-28 DIAGNOSIS — Z452 Encounter for adjustment and management of vascular access device: Secondary | ICD-10-CM | POA: Insufficient documentation

## 2020-03-28 DIAGNOSIS — I780 Hereditary hemorrhagic telangiectasia: Secondary | ICD-10-CM | POA: Diagnosis present

## 2020-03-28 DIAGNOSIS — D649 Anemia, unspecified: Secondary | ICD-10-CM | POA: Insufficient documentation

## 2020-03-28 DIAGNOSIS — Z95828 Presence of other vascular implants and grafts: Secondary | ICD-10-CM

## 2020-03-28 DIAGNOSIS — D500 Iron deficiency anemia secondary to blood loss (chronic): Secondary | ICD-10-CM

## 2020-03-28 DIAGNOSIS — K31819 Angiodysplasia of stomach and duodenum without bleeding: Secondary | ICD-10-CM

## 2020-03-28 LAB — CBC WITH DIFFERENTIAL (CANCER CENTER ONLY)
Abs Immature Granulocytes: 0.01 10*3/uL (ref 0.00–0.07)
Basophils Absolute: 0 10*3/uL (ref 0.0–0.1)
Basophils Relative: 1 %
Eosinophils Absolute: 0.4 10*3/uL (ref 0.0–0.5)
Eosinophils Relative: 7 %
HCT: 34.5 % — ABNORMAL LOW (ref 36.0–46.0)
Hemoglobin: 11.6 g/dL — ABNORMAL LOW (ref 12.0–15.0)
Immature Granulocytes: 0 %
Lymphocytes Relative: 14 %
Lymphs Abs: 0.7 10*3/uL (ref 0.7–4.0)
MCH: 30.8 pg (ref 26.0–34.0)
MCHC: 33.6 g/dL (ref 30.0–36.0)
MCV: 91.5 fL (ref 80.0–100.0)
Monocytes Absolute: 0.5 10*3/uL (ref 0.1–1.0)
Monocytes Relative: 11 %
Neutro Abs: 3.3 10*3/uL (ref 1.7–7.7)
Neutrophils Relative %: 67 %
Platelet Count: 144 10*3/uL — ABNORMAL LOW (ref 150–400)
RBC: 3.77 MIL/uL — ABNORMAL LOW (ref 3.87–5.11)
RDW: 13.3 % (ref 11.5–15.5)
WBC Count: 5 10*3/uL (ref 4.0–10.5)
nRBC: 0 % (ref 0.0–0.2)

## 2020-03-28 LAB — CMP (CANCER CENTER ONLY)
ALT: 28 U/L (ref 0–44)
AST: 25 U/L (ref 15–41)
Albumin: 3.2 g/dL — ABNORMAL LOW (ref 3.5–5.0)
Alkaline Phosphatase: 221 U/L — ABNORMAL HIGH (ref 38–126)
Anion gap: 7 (ref 5–15)
BUN: 15 mg/dL (ref 8–23)
CO2: 30 mmol/L (ref 22–32)
Calcium: 9.4 mg/dL (ref 8.9–10.3)
Chloride: 103 mmol/L (ref 98–111)
Creatinine: 1.6 mg/dL — ABNORMAL HIGH (ref 0.44–1.00)
GFR, Estimated: 32 mL/min — ABNORMAL LOW (ref >60)
Glucose, Bld: 211 mg/dL — ABNORMAL HIGH (ref 70–99)
Potassium: 3.8 mmol/L (ref 3.5–5.1)
Sodium: 140 mmol/L (ref 135–145)
Total Bilirubin: 0.8 mg/dL (ref 0.3–1.2)
Total Protein: 6.3 g/dL — ABNORMAL LOW (ref 6.5–8.1)

## 2020-03-28 LAB — FERRITIN: Ferritin: 74 ng/mL (ref 11–307)

## 2020-03-28 LAB — TOTAL PROTEIN, URINE DIPSTICK: Protein, ur: NEGATIVE mg/dL

## 2020-03-28 MED ORDER — SODIUM CHLORIDE 0.9% FLUSH
10.0000 mL | INTRAVENOUS | Status: DC | PRN
  Administered 2020-03-28: 10 mL
  Filled 2020-03-28: qty 10

## 2020-03-28 MED ORDER — HEPARIN SOD (PORK) LOCK FLUSH 100 UNIT/ML IV SOLN
500.0000 [IU] | Freq: Once | INTRAVENOUS | Status: AC | PRN
  Administered 2020-03-28: 500 [IU]
  Filled 2020-03-28: qty 5

## 2020-04-02 ENCOUNTER — Other Ambulatory Visit: Payer: Self-pay | Admitting: Nurse Practitioner

## 2020-04-02 DIAGNOSIS — D500 Iron deficiency anemia secondary to blood loss (chronic): Secondary | ICD-10-CM

## 2020-04-02 DIAGNOSIS — K31819 Angiodysplasia of stomach and duodenum without bleeding: Secondary | ICD-10-CM

## 2020-04-02 DIAGNOSIS — I780 Hereditary hemorrhagic telangiectasia: Secondary | ICD-10-CM

## 2020-04-03 ENCOUNTER — Telehealth: Payer: Self-pay | Admitting: Hematology

## 2020-04-03 NOTE — Telephone Encounter (Signed)
Scheduled appt per 2/22 sch msg - left message for pt with appt date and time

## 2020-04-04 ENCOUNTER — Other Ambulatory Visit: Payer: Self-pay | Admitting: Hematology

## 2020-04-09 ENCOUNTER — Ambulatory Visit: Payer: Medicare Other

## 2020-04-09 ENCOUNTER — Other Ambulatory Visit: Payer: Medicare Other

## 2020-04-09 ENCOUNTER — Ambulatory Visit: Payer: Medicare Other | Admitting: Nurse Practitioner

## 2020-04-11 ENCOUNTER — Other Ambulatory Visit: Payer: Self-pay

## 2020-04-11 ENCOUNTER — Inpatient Hospital Stay: Payer: Medicare Other

## 2020-04-11 ENCOUNTER — Inpatient Hospital Stay (HOSPITAL_BASED_OUTPATIENT_CLINIC_OR_DEPARTMENT_OTHER): Payer: Medicare Other | Admitting: Nurse Practitioner

## 2020-04-11 ENCOUNTER — Encounter: Payer: Self-pay | Admitting: Nurse Practitioner

## 2020-04-11 ENCOUNTER — Inpatient Hospital Stay: Payer: Medicare Other | Attending: Hematology

## 2020-04-11 VITALS — BP 132/70 | HR 66 | Temp 97.8°F | Resp 18 | Ht 63.0 in | Wt 137.0 lb

## 2020-04-11 DIAGNOSIS — I780 Hereditary hemorrhagic telangiectasia: Secondary | ICD-10-CM

## 2020-04-11 DIAGNOSIS — I130 Hypertensive heart and chronic kidney disease with heart failure and stage 1 through stage 4 chronic kidney disease, or unspecified chronic kidney disease: Secondary | ICD-10-CM | POA: Diagnosis not present

## 2020-04-11 DIAGNOSIS — K31819 Angiodysplasia of stomach and duodenum without bleeding: Secondary | ICD-10-CM

## 2020-04-11 DIAGNOSIS — E1122 Type 2 diabetes mellitus with diabetic chronic kidney disease: Secondary | ICD-10-CM | POA: Diagnosis not present

## 2020-04-11 DIAGNOSIS — D500 Iron deficiency anemia secondary to blood loss (chronic): Secondary | ICD-10-CM

## 2020-04-11 DIAGNOSIS — Z79899 Other long term (current) drug therapy: Secondary | ICD-10-CM | POA: Diagnosis not present

## 2020-04-11 DIAGNOSIS — Q2733 Arteriovenous malformation of digestive system vessel: Secondary | ICD-10-CM | POA: Insufficient documentation

## 2020-04-11 DIAGNOSIS — I5032 Chronic diastolic (congestive) heart failure: Secondary | ICD-10-CM | POA: Insufficient documentation

## 2020-04-11 DIAGNOSIS — F0390 Unspecified dementia without behavioral disturbance: Secondary | ICD-10-CM | POA: Diagnosis not present

## 2020-04-11 DIAGNOSIS — K219 Gastro-esophageal reflux disease without esophagitis: Secondary | ICD-10-CM | POA: Insufficient documentation

## 2020-04-11 DIAGNOSIS — Z95828 Presence of other vascular implants and grafts: Secondary | ICD-10-CM

## 2020-04-11 DIAGNOSIS — K31811 Angiodysplasia of stomach and duodenum with bleeding: Secondary | ICD-10-CM

## 2020-04-11 DIAGNOSIS — Z5112 Encounter for antineoplastic immunotherapy: Secondary | ICD-10-CM | POA: Insufficient documentation

## 2020-04-11 DIAGNOSIS — R040 Epistaxis: Secondary | ICD-10-CM | POA: Insufficient documentation

## 2020-04-11 DIAGNOSIS — D509 Iron deficiency anemia, unspecified: Secondary | ICD-10-CM

## 2020-04-11 DIAGNOSIS — Z794 Long term (current) use of insulin: Secondary | ICD-10-CM | POA: Diagnosis not present

## 2020-04-11 LAB — CBC WITH DIFFERENTIAL (CANCER CENTER ONLY)
Abs Immature Granulocytes: 0.01 10*3/uL (ref 0.00–0.07)
Basophils Absolute: 0 10*3/uL (ref 0.0–0.1)
Basophils Relative: 1 %
Eosinophils Absolute: 0.3 10*3/uL (ref 0.0–0.5)
Eosinophils Relative: 5 %
HCT: 35.8 % — ABNORMAL LOW (ref 36.0–46.0)
Hemoglobin: 11.7 g/dL — ABNORMAL LOW (ref 12.0–15.0)
Immature Granulocytes: 0 %
Lymphocytes Relative: 15 %
Lymphs Abs: 0.8 10*3/uL (ref 0.7–4.0)
MCH: 30.2 pg (ref 26.0–34.0)
MCHC: 32.7 g/dL (ref 30.0–36.0)
MCV: 92.5 fL (ref 80.0–100.0)
Monocytes Absolute: 0.6 10*3/uL (ref 0.1–1.0)
Monocytes Relative: 10 %
Neutro Abs: 4 10*3/uL (ref 1.7–7.7)
Neutrophils Relative %: 69 %
Platelet Count: 156 10*3/uL (ref 150–400)
RBC: 3.87 MIL/uL (ref 3.87–5.11)
RDW: 12.8 % (ref 11.5–15.5)
WBC Count: 5.7 10*3/uL (ref 4.0–10.5)
nRBC: 0 % (ref 0.0–0.2)

## 2020-04-11 LAB — CMP (CANCER CENTER ONLY)
ALT: 32 U/L (ref 0–44)
AST: 36 U/L (ref 15–41)
Albumin: 3.2 g/dL — ABNORMAL LOW (ref 3.5–5.0)
Alkaline Phosphatase: 216 U/L — ABNORMAL HIGH (ref 38–126)
Anion gap: 8 (ref 5–15)
BUN: 35 mg/dL — ABNORMAL HIGH (ref 8–23)
CO2: 28 mmol/L (ref 22–32)
Calcium: 9.2 mg/dL (ref 8.9–10.3)
Chloride: 103 mmol/L (ref 98–111)
Creatinine: 1.75 mg/dL — ABNORMAL HIGH (ref 0.44–1.00)
GFR, Estimated: 29 mL/min — ABNORMAL LOW (ref >60)
Glucose, Bld: 227 mg/dL — ABNORMAL HIGH (ref 70–99)
Potassium: 4.3 mmol/L (ref 3.5–5.1)
Sodium: 139 mmol/L (ref 135–145)
Total Bilirubin: 0.4 mg/dL (ref 0.3–1.2)
Total Protein: 6.6 g/dL (ref 6.5–8.1)

## 2020-04-11 LAB — FERRITIN: Ferritin: 56 ng/mL (ref 11–307)

## 2020-04-11 MED ORDER — SODIUM CHLORIDE 0.9% FLUSH
10.0000 mL | INTRAVENOUS | Status: DC | PRN
  Administered 2020-04-11: 10 mL
  Filled 2020-04-11: qty 10

## 2020-04-11 MED ORDER — HEPARIN SOD (PORK) LOCK FLUSH 100 UNIT/ML IV SOLN
500.0000 [IU] | Freq: Once | INTRAVENOUS | Status: AC | PRN
  Administered 2020-04-11: 500 [IU]
  Filled 2020-04-11: qty 5

## 2020-04-11 MED ORDER — SODIUM CHLORIDE 0.9 % IV SOLN
4.6000 mg/kg | Freq: Once | INTRAVENOUS | Status: AC
  Administered 2020-04-11: 300 mg via INTRAVENOUS
  Filled 2020-04-11: qty 12

## 2020-04-11 MED ORDER — SODIUM CHLORIDE 0.9 % IV SOLN
510.0000 mg | Freq: Once | INTRAVENOUS | Status: AC
  Administered 2020-04-11: 510 mg via INTRAVENOUS
  Filled 2020-04-11: qty 510

## 2020-04-11 MED ORDER — SODIUM CHLORIDE 0.9 % IV SOLN
Freq: Once | INTRAVENOUS | Status: AC
Start: 2020-04-11 — End: 2020-04-11
  Filled 2020-04-11: qty 250

## 2020-04-11 NOTE — Progress Notes (Signed)
Per Cira Rue NP, urine protein results not needed for today's treatment.

## 2020-04-11 NOTE — Progress Notes (Signed)
[place]   [phone]) [phone] [phone]   Clinic Follow up Note   Patient Care Team: Delice Bison, DO as PCP - General [name], MD as PCP - Cardiology (Cardiology) Truitt Merle, MD as Consulting Physician (Hematology) Elayne Snare, MD as Consulting Physician (Endocrinology) 04/11/2020  CHIEF COMPLAINT: Follow-up anemia and HHT  HEMATOLOGY HISTORY 1.Hereditary hemorrhagic telangiectasia (HHT),with epistaxis andfrequentGI bleeding. Previously under care of Dr. [name] 2.Anemia secondary to GI bleeding and iron deficiency, required blood transfusion every 3 to 4 weeks, IV Feraheme every 1 to 2 months  CURRENT THERAPY 1. Iv feraheme as needed if ferritin<100, latest on11/2021 2.Avastin5mg /kg every 2 weeks, starting3/30/20. Reduced to every 4 weeks starting 11/07/18.Reduced to every 2 months starting 01/02/19.Held 03/09/19-4/16/21due to high proteinuria.Give Avastin as needed 3. Blood transfusion if Hg<8.0  INTERVAL HISTORY: [name] returns for follow-up as scheduled.  She was last seen by me on 02/01/2020.  She received bevacizumab on 02/09/2020.  Labs on 03/28/2020 showed ferritin of 74 and mild anemia Hg 11.6.  She is doing well, no changes.  She continues to have mild intermittent nosebleeds, no other bleeding.  She fell twice due to being close to the edge of the bed, no injury.  She denies weakness, dizziness, fatigue.  Her legs hurt but this is a chronic issue.  Constipation is stable.  She takes oral iron once daily.  Denies recent fever, chills, cough, chest pain, dyspnea, leg edema, abdominal pain, or other concerns.   MEDICAL HISTORY:  Past Medical History:  Diagnosis Date  . Chronic anemia   . Chronic diastolic CHF (congestive heart failure) ([place]) 10/03/2013  . Chronic GI bleeding    [name] 11/29/2014  . Family history of anesthesia complication    "niece has a hard time coming out" (09/15/2012)  . Frequent  nosebleeds    chronic  . Gastric AV malformation    [name] 11/29/2014  . GERD (gastroesophageal reflux disease)   . 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 History of blood transfusion "several"  . History of epistaxis   . HTN (hypertension), benign 03/02/2012  . Hyperlipidemia   . Iron deficiency anemia    chronic infusions"  . Lichen planus    Both lower extremities  . 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 Overgrown toenails 03/18/2017  . Pneumonia 1990's X 2  . Pulmonary HTN ([place]) 04/01/2017   PASP 20mmHg on echo 09/2016 and 65mmHg by echo 2019  . Seizures ([place]) 09/2014  . Symptomatic anemia 11/29/2014  . Telangiectasia    Gastric   . 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    SURGICAL HISTORY: Past Surgical History:  Procedure Laterality Date  . CATARACT EXTRACTION     "I think it was just one eye"  . ESOPHAGOGASTRODUODENOSCOPY  02/26/2011   Procedure: ESOPHAGOGASTRODUODENOSCOPY (EGD);  Surgeon: [name], MD;  Location: [name];  Service: Endoscopy;  Laterality: N/A;  . ESOPHAGOGASTRODUODENOSCOPY N/A 11/08/2012   Procedure: ESOPHAGOGASTRODUODENOSCOPY (EGD);  Surgeon: [name], MD;  Location: [name];  Service: Endoscopy;  Laterality: N/A;  . ESOPHAGOGASTRODUODENOSCOPY N/A 10/04/2013   Procedure: ESOPHAGOGASTRODUODENOSCOPY (EGD);  Surgeon: Winfield Cunas., MD;  Location: [name];  Service: Endoscopy;  Laterality: N/A;  with APC on stand-by  . ESOPHAGOGASTRODUODENOSCOPY N/A 07/06/2014   Procedure: ESOPHAGOGASTRODUODENOSCOPY (EGD);  Surgeon: Clarene Essex, MD;  Location: [name];  Service: Endoscopy;  Laterality: N/A;  . ESOPHAGOGASTRODUODENOSCOPY N/A  09/05/2014   Procedure: ESOPHAGOGASTRODUODENOSCOPY (EGD);  Surgeon: Laurence Spates, MD;  Location: [name];  Service: Endoscopy;  Laterality: N/A;  APC on standby to control bleeding  . ESOPHAGOGASTRODUODENOSCOPY N/A 11/29/2014   Procedure:  ESOPHAGOGASTRODUODENOSCOPY (EGD);  Surgeon: [name], MD;  Location: [facility] ENDOSCOPY;  Service: Endoscopy;  Laterality: N/A;  . ESOPHAGOGASTRODUODENOSCOPY N/A 09/28/2015   Procedure: ESOPHAGOGASTRODUODENOSCOPY (EGD);  Surgeon: Clarene Essex, MD;  Location[facility] ENDOSCOPY;  Service: Endoscopy;  Laterality: N/A;  . ESOPHAGOGASTRODUODENOSCOPY (EGD) WITH PROPOFOL N/A 12/04/2017   Procedure: ESOPHAGOGASTRODUODENOSCOPY (EGD) WITH PROPOFOL;  Surgeon: [name], MD;  Location: [facility];  Service: Endoscopy;  Laterality: N/A;  . ESOPHAGOGASTRODUODENOSCOPY ENDOSCOPY  08/19/2006   with laser treatment  . HOT HEMOSTASIS N/A 07/06/2014   Procedure: HOT HEMOSTASIS (ARGON PLASMA COAGULATION/BICAP);  Surgeon: Clarene Essex, MD;  Location: [name];  Service: Endoscopy;  Laterality: N/A;  . HOT HEMOSTASIS N/A 09/28/2015   Procedure: HOT HEMOSTASIS (ARGON PLASMA COAGULATION/BICAP);  Surgeon: Clarene Essex, MD;  Location: [facility] ENDOSCOPY;  Service: Endoscopy;  Laterality: N/A;  . HOT HEMOSTASIS N/A 12/04/2017   Procedure: HOT HEMOSTASIS (ARGON PLASMA COAGULATION/BICAP);  Surgeon: [name], MD;  Location: [place];  Service: Endoscopy;  Laterality: N/A;  . NASAL HEMORRHAGE CONTROL     "for bleeding"   . SAVORY DILATION  02/26/2011   Procedure: SAVORY DILATION;  Surgeon: [name], MD;  Location: [name];  Service: Endoscopy;  Laterality: N/A;  c-arm needed  . SUBMUCOSAL INJECTION  12/04/2017   Procedure: SUBMUCOSAL INJECTION;  Surgeon: [name], MD;  Location: [facility] ENDOSCOPY;  Service: Endoscopy;;    I have reviewed the social history and family history with the patient and they are unchanged from previous note.  ALLERGIES:  is allergic to aspirin.  MEDICATIONS:  Current Outpatient Medications  Medication Sig Dispense Refill  . Accu-Chek Softclix Lancets lancets 1 each by Other route 3 (three) times daily. Use as instructed to check blood sugar 3 times per day dx code E11.65  100 each 3  . amLODipine (NORVASC) 10 MG tablet Take 1 tablet (10 mg total) by mouth daily. 30 tablet 11  . cetaphil (CETAPHIL) lotion Apply 1 application topically as needed for dry skin. 236 mL 0  . donepezil (ARICEPT) 10 MG tablet Take 1 tablet (10 mg total) by mouth at bedtime. 30 tablet 5  . Dulaglutide (TRULICITY) 1.5 ST/4.1DQ SOPN INJECT 1.5MG  SUBCUTANEOUSLY EVERY WEEK ON THURSDAY 2 mL 3  . Elastic Bandages & Supports (MEDICAL COMPRESSION STOCKINGS) MISC Wear as much as possible while awake to reduce swelling 2 each 0  . Ferrous Fumarate (FERROCITE) 324 (106 Fe) MG TABS tablet Take 1 tablet (106 mg of iron total) by mouth every other day. 15 tablet 5  . gabapentin (NEURONTIN) 100 MG capsule Take 1 capsule (100 mg total) by mouth 2 (two) times daily. 60 capsule 5  . glucose blood (ACCU-CHEK GUIDE) test strip 1 each by Other route as needed for other. Use as instructed to check blood sugar 3 times per day Dx code E11.65 100 each 3  . insulin lispro (HUMALOG) 100 UNIT/ML KwikPen Inject 3 U with lunch and dinner. If eating a breakfast heavy in carbs such as grits or hash browns, also inject 3 U with breakfast. (Patient taking differently: Inject 3 U with lunch and dinner. If eating a breakfast heavy in carbs such as grits or hash browns, also inject 3 U with breakfast.  IS ONLY TAKING AT NIGHT) 15 mL 2  . Insulin Lispro  Prot & Lispro (HUMALOG MIX 75/25 KWIKPEN) (75-25) 100 UNIT/ML Kwikpen Inject 10 Units into the skin daily before breakfast. 15 mL 1  . Insulin Pen Needle (B-D UF III MINI PEN NEEDLES) 31G X 5 MM MISC Use as directed to inject insulin 100 each 11  . lactulose (CHRONULAC) 10 GM/15ML solution TAKE 15 ML BY MOUTH TWO TIMES DAILY 473 mL 2  . levETIRAcetam (KEPPRA) 500 MG tablet Take 1 tablet (500 mg total) by mouth 2 (two) times daily. 180 tablet 3  . memantine (NAMENDA) 10 MG tablet Take 1 tablet (10 mg total) by mouth 2 (two) times daily. 60 tablet 11  . oxymetazoline (AFRIN) 0.05 %  nasal spray Place 1 spray into both nostrils 2 (two) times daily as needed (Epistaxis). 30 mL 0  . pantoprazole (PROTONIX) 40 MG tablet Take 1 tablet by mouth once daily (Patient not taking: No sig reported) 180 tablet 0  . Potassium Chloride ER 20 MEQ TBCR Take 20 mEq by mouth 2 (two) times daily. 60 tablet 5  . torsemide (DEMADEX) 20 MG tablet Take 1 tablet (20 mg total) by mouth daily. 30 tablet 11  . triamcinolone ointment (KENALOG) 0.5 % APPLY 1 APPLICATION TOPICALLY TO RASH TWICE DAILY FOR ITCHING 15 g 5   No current facility-administered medications for this visit.   Facility-Administered Medications Ordered in Other Visits  Medication Dose Route Frequency Provider Last Rate Last Admin  . 0.9 %  sodium chloride infusion   Intravenous Once Truitt Merle, MD      . bevacizumab-awwb (MVASI) 300 mg in sodium chloride 0.9 % 100 mL chemo infusion  4.6 mg/kg (Treatment Plan Recorded) Intravenous Once Truitt Merle, MD      . heparin lock flush 100 unit/mL  500 Units Intracatheter Once PRN Truitt Merle, MD      . sodium chloride 0.9 % injection 10 mL  10 mL Intracatheter PRN Truitt Merle, MD      . sodium chloride flush (NS) 0.9 % injection 10 mL  10 mL Intracatheter PRN Truitt Merle, MD   10 mL at 08/18/19 1717  . sodium chloride flush (NS) 0.9 % injection 10 mL  10 mL Intracatheter PRN Truitt Merle, MD        PHYSICAL EXAMINATION: ECOG PERFORMANCE STATUS: 1 - Symptomatic but completely ambulatory  Vitals:   04/11/20 1301  BP: 132/70  Pulse: 66  Resp: 18  Temp: 97.8 F (36.6 C)  SpO2: 96%   Filed Weights   04/11/20 1301  Weight: 137 lb (62.1 kg)    GENERAL:alert, no distress and comfortable SKIN: No rash or obvious ecchymoses EYES:  sclera clear LUNGS: clear with normal breathing effort HEART: regular rate & rhythm, no lower extremity edema ABDOMEN:abdomen soft, non-tender and normal bowel sounds NEURO: alert & oriented x 3 with fluent speech PAC without erythema  LABORATORY DATA:  I have  reviewed the data as listed CBC Latest Ref Rng & Units 04/11/2020 03/28/2020 02/01/2020  WBC 4.0 - 10.5 K/uL 5.7 5.0 5.3  Hemoglobin 12.0 - 15.0 g/dL 11.7(L) 11.6(L) 12.6  Hematocrit 36.0 - 46.0 % 35.8(L) 34.5(L) 35.8(L)  Platelets 150 - 400 K/uL 156 144(L) 135(L)     CMP Latest Ref Rng & Units 04/11/2020 03/28/2020 02/01/2020  Glucose 70 - 99 mg/dL 227(H) 211(H) 222(H)  BUN 8 - 23 mg/dL 35(H) 15 14  Creatinine 0.44 - 1.00 mg/dL 1.75(H) 1.60(H) 1.27(H)  Sodium 135 - 145 mmol/L 139 140 140  Potassium 3.5 - 5.1 mmol/L 4.3  3.8 4.5  Chloride 98 - 111 mmol/L 103 103 108  CO2 22 - 32 mmol/L 28 30 22   Calcium 8.9 - 10.3 mg/dL 9.2 9.4 9.9  Total Protein 6.5 - 8.1 g/dL 6.6 6.3(L) 7.2  Total Bilirubin 0.3 - 1.2 mg/dL 0.4 0.8 0.6  Alkaline Phos 38 - 126 U/L 216(H) 221(H) 150(H)  AST 15 - 41 U/L 36 25 34  ALT 0 - 44 U/L 32 28 28      RADIOGRAPHIC STUDIES: I have personally reviewed the radiological images as listed and agreed with the findings in the report. No results found.   ASSESSMENT & PLAN: Porshea Janowski Littleis a 80 y.o.femalewith    1.Hereditary hemorrhagic telangiectasia(HHT)), withepistaxis and frequent GI bleeding -controlled with Avastin PRN, IV Feraheme PRN for ferritin <100. And RBC transfusion if needed but has not needed in quite a while  -continue oral iron 2. 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 DM, CHF,CKD stage IIIand HTN secondary to Avastin - followed by Dr. Dwyane Dee 6. Worsening proteinuria - likely secondary to avastin. Give PRN now  Disposition : Ms. Maqueda appears stable.  She has not experienced new/worsening bleeding.  She continues oral iron.  She tolerates Avastin and IV Feraheme as needed.  Labs show mild anemia Hg 11.7, ferritin 74 (03/28/2020), worsening DM and CKD.  She is followed by Dr. Dwyane Dee.  She will proceed with bevacizumab and IV Feraheme today.  She  will return for lab in 4 and 8 weeks.  We will arrange additional IV iron and/or bevacizumab as needed for worsening anemia.  Follow-up in 8 weeks.  All questions were answered. The patient knows to call the clinic with any problems, questions or concerns. No barriers to learning were detected.     Alla Feeling, NP 04/11/20

## 2020-04-11 NOTE — Patient Instructions (Signed)
[facility] Discharge Instructions for Patients Receiving Chemotherapy  Today you received the following chemotherapy agents: bevacizumab.  To help prevent nausea and vomiting after your treatment, we encourage you to take your nausea medication as directed.   If you develop nausea and vomiting that is not controlled by your nausea medication, call the clinic.   BELOW ARE SYMPTOMS THAT SHOULD BE REPORTED IMMEDIATELY:  *FEVER GREATER THAN 100.5 F  *CHILLS WITH OR WITHOUT FEVER  NAUSEA AND VOMITING THAT IS NOT CONTROLLED WITH YOUR NAUSEA MEDICATION  *UNUSUAL SHORTNESS OF BREATH  *UNUSUAL BRUISING OR BLEEDING  TENDERNESS IN MOUTH AND THROAT WITH OR WITHOUT PRESENCE OF ULCERS  *URINARY PROBLEMS  *BOWEL PROBLEMS  UNUSUAL RASH Items with * indicate a potential emergency and should be followed up as soon as possible.  Feel free to call the clinic should you have any questions or concerns. The clinic phone number is [phone].  Please show the [place] at check-in to the Emergency Department and triage nurse.  Ferumoxytol injection What is this medicine? FERUMOXYTOL is an iron complex. Iron is used to make healthy red blood cells, which carry oxygen and nutrients throughout the body. This medicine is used to treat iron deficiency anemia. This medicine may be used for other purposes; ask your health care provider or pharmacist if you have questions. COMMON BRAND NAME(S): Feraheme What should I tell my health care provider before I take this medicine? They need to know if you have any of these conditions:  anemia not caused by low iron levels  high levels of iron in the blood  magnetic resonance imaging (MRI) test scheduled  an unusual or allergic reaction to iron, other medicines, foods, dyes, or preservatives  pregnant or trying to get pregnant  breast-feeding How should I use this medicine? This medicine is for injection into a vein. It is  given by a health care professional in a hospital or clinic setting. Talk to your pediatrician regarding the use of this medicine in children. Special care may be needed. Overdosage: If you think you have taken too much of this medicine contact a poison control center or emergency room at once. NOTE: This medicine is only for you. Do not share this medicine with others. What if I miss a dose? It is important not to miss your dose. Call your doctor or health care professional if you are unable to keep an appointment. What may interact with this medicine? This medicine may interact with the following medications:  other iron products This list may not describe all possible interactions. Give your health care provider a list of all the medicines, herbs, non-prescription drugs, or dietary supplements you use. Also tell them if you smoke, drink alcohol, or use illegal drugs. Some items may interact with your medicine. What should I watch for while using this medicine? Visit your doctor or healthcare professional regularly. Tell your doctor or healthcare professional if your symptoms do not start to get better or if they get worse. You may need blood work done while you are taking this medicine. You may need to follow a special diet. Talk to your doctor. Foods that contain iron include: whole grains/cereals, dried fruits, beans, or peas, leafy green vegetables, and organ meats (liver, kidney). What side effects may I notice from receiving this medicine? Side effects that you should report to your doctor or health care professional as soon as possible:  allergic reactions like skin rash, itching or hives, swelling of  the face, lips, or tongue  breathing problems  changes in blood pressure  feeling faint or lightheaded, falls  fever or chills  flushing, sweating, or hot feelings  swelling of the ankles or feet Side effects that usually do not require medical attention (report to your doctor or  health care professional if they continue or are bothersome):  diarrhea  headache  nausea, vomiting  stomach pain This list may not describe all possible side effects. Call your doctor for medical advice about side effects. You may report side effects to FDA at 1-800-FDA-1088. Where should I keep my medicine? This drug is given in a hospital or clinic and will not be stored at home. NOTE: This sheet is a summary. It may not cover all possible information. If you have questions about this medicine, talk to your doctor, pharmacist, or health care provider.  2021 Elsevier/Gold Standard (2016-03-16 20:21:10)

## 2020-04-12 ENCOUNTER — Telehealth: Payer: Self-pay | Admitting: Nurse Practitioner

## 2020-04-12 NOTE — Telephone Encounter (Signed)
Scheduled follow-up appointments per 3/3 los. Patient's niece is aware.

## 2020-04-15 ENCOUNTER — Ambulatory Visit: Payer: Medicare Other | Admitting: Endocrinology

## 2020-04-16 ENCOUNTER — Other Ambulatory Visit: Payer: Self-pay | Admitting: Internal Medicine

## 2020-04-17 ENCOUNTER — Other Ambulatory Visit: Payer: Self-pay | Admitting: Endocrinology

## 2020-04-17 ENCOUNTER — Telehealth: Payer: Self-pay

## 2020-04-17 DIAGNOSIS — E1121 Type 2 diabetes mellitus with diabetic nephropathy: Secondary | ICD-10-CM

## 2020-04-17 DIAGNOSIS — IMO0002 Reserved for concepts with insufficient information to code with codable children: Secondary | ICD-10-CM

## 2020-04-17 NOTE — Telephone Encounter (Signed)
Pt niece is requesting a call back about getting the pt  Bed rails .. she can be reached @ [place]

## 2020-04-18 ENCOUNTER — Telehealth: Payer: Self-pay | Admitting: *Deleted

## 2020-04-18 NOTE — Telephone Encounter (Signed)
Also try calling in OZEMPIC 0.5 mg weekly, if covered she will need to call back for instructions

## 2020-04-18 NOTE — Telephone Encounter (Signed)
Notified pt niece Rx Trulicity INJ--is not covered by insurance. Pt niece will call the insurance for info. On the preffered drug and will call the office back.

## 2020-04-19 NOTE — Telephone Encounter (Signed)
Pt's daughter called to let us know she called the insurance regarding this issue and pt's insurance actually does cover the Trulicity, however, they put a limit on how many she can have in a certain time so they will decline if it is more. Her insurance will cover a total of 4 within a 28 day period and so the reason why it was denied was because the request was put in before the 28 day period.

## 2020-04-19 NOTE — Telephone Encounter (Signed)
Called insurance--verified as for trulicity 1.5 QI/2.9NL Q-2 ml for 28 days which the Rx is correct.

## 2020-04-25 ENCOUNTER — Other Ambulatory Visit: Payer: Medicare Other

## 2020-04-25 ENCOUNTER — Encounter: Payer: Self-pay | Admitting: Endocrinology

## 2020-04-25 ENCOUNTER — Other Ambulatory Visit: Payer: Self-pay

## 2020-04-25 ENCOUNTER — Ambulatory Visit (INDEPENDENT_AMBULATORY_CARE_PROVIDER_SITE_OTHER): Payer: Medicare Other | Admitting: Endocrinology

## 2020-04-25 VITALS — BP 118/64 | HR 60 | Resp 18 | Ht 63.0 in | Wt 137.4 lb

## 2020-04-25 DIAGNOSIS — Z794 Long term (current) use of insulin: Secondary | ICD-10-CM | POA: Diagnosis not present

## 2020-04-25 DIAGNOSIS — E1165 Type 2 diabetes mellitus with hyperglycemia: Secondary | ICD-10-CM

## 2020-04-25 DIAGNOSIS — I10 Essential (primary) hypertension: Secondary | ICD-10-CM | POA: Diagnosis not present

## 2020-04-25 LAB — POCT GLYCOSYLATED HEMOGLOBIN (HGB A1C): Hemoglobin A1C: 7.1 % — AB (ref 4.0–5.6)

## 2020-04-25 MED ORDER — FREESTYLE LIBRE 2 READER DEVI: 1.0000 | Freq: Once | 0 refills | Status: AC

## 2020-04-25 MED ORDER — FREESTYLE LIBRE 2 SENSOR MISC: 2.0000 | 3 refills | Status: DC

## 2020-04-25 NOTE — Progress Notes (Signed)
Patient ID: [name], female   DOB: 03-Feb-1941, 80 y.o.   MRN: [id number]           Reason for Appointment: Follow-up for Type 2 Diabetes  Referring PCP: Koleen Distance   History of Present Illness:          Date of diagnosis of type 2 diabetes mellitus: 1988       Background history:   Patient has been on insulin for a few years and she is unclear when this was started, according to Epic she was started on Levemir in 2015 Prior to that likely had been on various oral hypoglycemic agents including metformin, glipizide and Tradjenta Her level of control has been somewhat variable but not consistently poor; her A1c has ranged between 5.6 and 7.6  Recent history:   Her A1c is 7.1 compared to 7.5, previously was 8.4  Previous fructosamine 256  INSULIN regimen is: Humalog mix 10 units before breakfast and Humalog 4 units mostly before dinner    Non-insulin hypoglycemic drugs prescribed: Trulicity 1.5 mg weekly  Current management, blood sugar patterns and problems identified:  She was switched from [place] to Humalog mix because of high readings in the afternoons and evenings  Also she cannot be reliably taking insulin coverage for lunchtime on her own  Over the last month her blood sugars are highly variable with some days blood sugars are well over 300 and occasionally has near normal readings throughout the day  No consistent pattern is seen on her blood sugars although generally are higher after 6 PM and occasionally overnight  Overall her average her blood sugars are still slightly higher than before  No hypoglycemia  As before she checks blood sugars numerous times during the day  Fasting blood sugars appear to be variable  Her niece thinks that she may not be taking her insulin consistently at suppertime before eating and appears she may have taken it once at bedtime and her blood sugar was 362  Her niece thinks that she is eating fairly well although patient thinks  her appetite is decreased  No nausea with current dose of Trulicity which she takes every Monday        Side effects from medications have been: None known     Meal times are:  Breakfast is at variable times between 8 AM-9  Dinner 7 pm  Typical meal intake: Breakfast is Cheerios or raisin Bran               Exercise:  Unable to do any  Glucose monitoring:  Checking up to 6 times a day       Glucometer: Accu-Chek    Blood Glucose readings with readings for the last week and averages for the last 30 days   PRE-MEAL Fasting Lunch Dinner Bedtime Overall  Glucose range:  100-285    140-399   Mean/median:  175  226  239  246 227   POST-MEAL PC Breakfast PC Lunch PC Dinner  Glucose range:    158-296  Mean/median:    246   PREVIOUSLY:   PRE-MEAL Fasting Lunch Dinner Bedtime Overall  Glucose range:  143-266  104-268  208-326    Mean/median:  158  165   233  219+/-83   POST-MEAL PC Breakfast PC Lunch PC Dinner  Glucose range:   89-317  304-330  Mean/median:    266      Dietician visit, most recent: 11/2018  Weight history:  Wt Readings from Last  3 Encounters:  04/25/20 137 lb 6.4 oz (62.3 kg)  04/11/20 137 lb (62.1 kg)  03/13/20 133 lb 12.8 oz (60.7 kg)    Glycemic control:   Lab Results  Component Value Date   HGBA1C 7.1 (A) 04/25/2020   HGBA1C 7.5 (A) 02/01/2020   HGBA1C 8.4 (H) 10/31/2019   Lab Results  Component Value Date   MICROALBUR 1,289.4 (H) 03/07/2019   LDLCALC 57 01/30/2019   CREATININE 1.75 (H) 04/11/2020   Lab Results  Component Value Date   MICRALBCREAT 5 03/28/2018    Lab Results  Component Value Date   FRUCTOSAMINE 404 (H) 10/31/2019   FRUCTOSAMINE 256 06/26/2019    Office Visit on 04/25/2020  Component Date Value Ref Range Status  . Hemoglobin A1C 04/25/2020 7.1* 4.0 - 5.6 % Final    Allergies as of 04/25/2020      Reactions   Aspirin Nausea And Vomiting      Medication List       Accurate as of April 25, 2020 11:57 AM. If  you have any questions, ask your nurse or doctor.        Accu-Chek Guide test strip Generic drug: glucose blood 1 each by Other route as needed for other. Use as instructed to check blood sugar 3 times per day Dx code E11.65   Accu-Chek Softclix Lancets lancets 1 each by Other route 3 (three) times daily. Use as instructed to check blood sugar 3 times per day dx code E11.65   amLODipine 10 MG tablet Commonly known as: NORVASC Take 1 tablet (10 mg total) by mouth daily.   B-D UF III MINI PEN NEEDLES 31G X 5 MM Misc Generic drug: Insulin Pen Needle USE TO INJECT INSULIN 3 TIMES A DAY   cetaphil lotion Apply 1 application topically as needed for dry skin.   donepezil 10 MG tablet Commonly known as: ARICEPT Take 1 tablet (10 mg total) by mouth at bedtime.   Ferrous Fumarate 324 (106 Fe) MG Tabs tablet Commonly known as: Ferrocite Take 1 tablet (106 mg of iron total) by mouth every other day.   FreeStyle Libre 2 Reader Devi 1 Device by Does not apply route once for 1 dose. Started by: Elayne Snare, MD   FreeStyle Libre 2 Sensor Misc 2 Devices by Does not apply route every 14 (fourteen) days. Started by: Elayne Snare, MD   gabapentin 100 MG capsule Commonly known as: Neurontin Take 1 capsule (100 mg total) by mouth 2 (two) times daily.   HumaLOG Mix 75/25 KwikPen (75-25) 100 UNIT/ML Kwikpen Generic drug: Insulin Lispro Prot & Lispro INJECT 10 UNITS SUBCUTANEOUSLY ONCE DAILY   insulin lispro 100 UNIT/ML KwikPen Commonly known as: HUMALOG Inject 3 U with lunch and dinner. If eating a breakfast heavy in carbs such as grits or hash browns, also inject 3 U with breakfast. What changed: additional instructions   lactulose 10 GM/15ML solution Commonly known as: CHRONULAC TAKE 15 ML BY MOUTH TWO TIMES DAILY   levETIRAcetam 500 MG tablet Commonly known as: KEPPRA Take 1 tablet (500 mg total) by mouth 2 (two) times daily.   Medical Compression Stockings Misc Wear as much as  possible while awake to reduce swelling   memantine 10 MG tablet Commonly known as: NAMENDA Take 1 tablet (10 mg total) by mouth 2 (two) times daily.   oxymetazoline 0.05 % nasal spray Commonly known as: AFRIN Place 1 spray into both nostrils 2 (two) times daily as needed (Epistaxis).   pantoprazole 40  MG tablet Commonly known as: PROTONIX Take 1 tablet by mouth once daily   Potassium Chloride ER 20 MEQ Tbcr Take 20 mEq by mouth 2 (two) times daily.   torsemide 20 MG tablet Commonly known as: DEMADEX Take 1 tablet (20 mg total) by mouth daily.   triamcinolone ointment 0.5 % Commonly known as: KENALOG APPLY 1 APPLICATION TOPICALLY TO RASH TWICE DAILY FOR ITCHING   Trulicity 1.5 MW/1.0UV Sopn Generic drug: Dulaglutide INJECT 1.5MG  SUBCUTANEOUSLY EVERY WEEK ON THURSDAY       Allergies:  Allergies  Allergen Reactions  . Aspirin Nausea And Vomiting    Past Medical History:  Diagnosis Date  . Chronic anemia   . Chronic diastolic CHF (congestive heart failure) ([place]) 10/03/2013  . Chronic GI bleeding    [name] 11/29/2014  . Family history of anesthesia complication    "niece has a hard time coming out" (09/15/2012)  . Frequent nosebleeds    chronic  . Gastric AV malformation    [name] 11/29/2014  . GERD (gastroesophageal reflux disease)   . 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 History of blood transfusion "several"  . History of epistaxis   . HTN (hypertension), benign 03/02/2012  . Hyperlipidemia   . Iron deficiency anemia    chronic infusions"  . Lichen planus    Both lower extremities  . 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 Overgrown toenails 03/18/2017  . Pneumonia 1990's X 2  . Pulmonary HTN ([place]) 04/01/2017   PASP 58mmHg on echo 09/2016 and 81mmHg by echo 2019  . Seizures ([place]) 09/2014  . Symptomatic anemia 11/29/2014  . Telangiectasia    Gastric   . 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    Past  Surgical History:  Procedure Laterality Date  . CATARACT EXTRACTION     "I think it was just one eye"  . ESOPHAGOGASTRODUODENOSCOPY  02/26/2011   Procedure: ESOPHAGOGASTRODUODENOSCOPY (EGD);  Surgeon: [name], MD;  Location: [name];  Service: Endoscopy;  Laterality: N/A;  . ESOPHAGOGASTRODUODENOSCOPY N/A 11/08/2012   Procedure: ESOPHAGOGASTRODUODENOSCOPY (EGD);  Surgeon: [name], MD;  Location: [name];  Service: Endoscopy;  Laterality: N/A;  . ESOPHAGOGASTRODUODENOSCOPY N/A 10/04/2013   Procedure: ESOPHAGOGASTRODUODENOSCOPY (EGD);  Surgeon: Winfield Cunas., MD;  Location: [name];  Service: Endoscopy;  Laterality: N/A;  with APC on stand-by  . ESOPHAGOGASTRODUODENOSCOPY N/A 07/06/2014   Procedure: ESOPHAGOGASTRODUODENOSCOPY (EGD);  Surgeon: Clarene Essex, MD;  Location: [name];  Service: Endoscopy;  Laterality: N/A;  . ESOPHAGOGASTRODUODENOSCOPY N/A 09/05/2014   Procedure: ESOPHAGOGASTRODUODENOSCOPY (EGD);  Surgeon: Laurence Spates, MD;  Location: [name];  Service: Endoscopy;  Laterality: N/A;  APC on standby to control bleeding  . ESOPHAGOGASTRODUODENOSCOPY N/A 11/29/2014   Procedure: ESOPHAGOGASTRODUODENOSCOPY (EGD);  Surgeon: [name], MD;  Location[facility] END[facility]COPY;  Service: Endoscopy;  Laterality: N/A;  . ESOPHAGOGASTRODUODENOSCOPY N/A 09/28/2015   Procedure: ESOPHAGOGASTRODUODENOSCOPY (EGD);  Surgeon: Clarene Essex, MD;  Location: [facility] ENDOSCOPY;  Service: Endoscopy;  Laterality: N/A;  . ESOPHAGOGASTRODUODENOSCOPY (EGD) WITH PROPOFOL N/A 12/04/2017   Procedure: ESOPHAGOGASTRODUODENOSCOPY (EGD) WITH PROPOFOL;  Surgeon: [name], MD;  Location: [facility];  Service: Endoscopy;  Laterality: N/A;  . ESOPHAGOGASTRODUODENOSCOPY ENDOSCOPY  08/19/2006   with laser treatment  . HOT HEMOSTASIS N/A 07/06/2014   Procedure: HOT HEMOSTASIS (ARGON PLASMA COAGULATION/BICAP);  Surgeon: Clarene Essex, MD;  Location: [name];  Service: Endoscopy;  Laterality:  N/A;  . HOT HEMOSTASIS N/A 09/28/2015  Procedure: HOT HEMOSTASIS (ARGON PLASMA COAGULATION/BICAP);  Surgeon: Clarene Essex, MD;  Location: [facility] ENDOSCOPY;  Service: Endoscopy;  Laterality: N/A;  . HOT HEMOSTASIS N/A 12/04/2017   Procedure: HOT HEMOSTASIS (ARGON PLASMA COAGULATION/BICAP);  Surgeon: [name], MD;  Location: [facility];  Service: Endoscopy;  Laterality: N/A;  . NASAL HEMORRHAGE CONTROL     "for bleeding"   . SAVORY DILATION  02/26/2011   Procedure: SAVORY DILATION;  Surgeon: [name], MD;  Location: [name];  Service: Endoscopy;  Laterality: N/A;  c-arm needed  . SUBMUCOSAL INJECTION  12/04/2017   Procedure: SUBMUCOSAL INJECTION;  Surgeon: [name], MD;  Location[facility] ENDOSCOPY;  Service: Endoscopy;;    Family History  Problem Relation Age of Onset  . Stroke Father   . Healthy Mother   . Breast cancer Other   . Diabetes Niece   . Malignant hyperthermia Neg Hx   . Seizures Neg Hx     Social History:  reports that she quit smoking about 49 years ago. Her smoking use included cigarettes. She has a 20.00 pack-year smoking history. She has never used smokeless tobacco. She reports that she does not drink alcohol and does not use drugs.   Review of Systems     Lipid history: As below    Lab Results  Component Value Date   CHOL 133 01/30/2019   HDL 41.30 01/30/2019   LDLCALC 57 01/30/2019   TRIG 176.0 (H) 01/30/2019   CHOLHDL 3 01/30/2019           Hypertension: taking amlodipine 10 mg from her PCP   BP Readings from Last 3 Encounters:  04/25/20 118/64  04/11/20 132/70  03/13/20 (!) 102/58   Renal function history: She is on Demadex  Lab Results  Component Value Date   CREATININE 1.75 (H) 04/11/2020   CREATININE 1.60 (H) 03/28/2020   CREATININE 1.27 (H) 02/01/2020    Most recent eye exam was in 07/2018  Most recent foot exam: 06/2018 by podiatrist with no evidence of sensory loss or reduction in sensation   Complications of  diabetes: Has retinopathy, microalbuminuria  LABS:  Office Visit on 04/25/2020  Component Date Value Ref Range Status  . Hemoglobin A1C 04/25/2020 7.1* 4.0 - 5.6 % Final    Physical Examination:  BP 118/64 (BP Location: Left Arm, Patient Position: Sitting, Cuff Size: Normal)   Pulse 60   Resp 18   Ht 5\' 3"  (1.6 m)   Wt 137 lb 6.4 oz (62.3 kg)   LMP  (LMP Unknown)   SpO2 96%   BMI 24.34 kg/m    ASSESSMENT:  Diabetes type 2 on insulin  See history of present illness for detailed discussion of current diabetes management, blood sugar patterns and problems identified  A1c is 7.1  She is currently on Trulicity 1.5 mg, Humalog 4 units before dinner and Humalog mix 10 units in the morning Her A1c is likely inaccurate because of anemia  Blood sugars are highly variable at all different times especially in the afternoons and evenings This is related to her inconsistent compliance with diet and insulin timing  She is unable to manage her insulin injections and diet on her own and only gets help in the evening from her niece She is not having excessive anorexia from Trulicity and no further weight loss  Blood sugars overall are still high despite starting Humalog mix insulin in the morning   Hypertension: Blood pressure is not as low with reducing amlodipine to half tablet  PLAN:    Continue 1.5 mg Trulicity Increase Humalog mix by 2 units from tomorrow and go up 2 units every week if afternoon readings are consistently over 200 5 units Humalog at suppertime Discussed differences between premixed and Humalog insulin  She needs to try and have her Humalog doses before starting to eat in the evening Discussed in detail the use of the freestyle libre CGM This would benefit her and help identify her blood sugars as well as alert her to high and low sugars Her niece will let us know when she gets the supplies and will train her how to use it Prescription sent to [facility]  care  Follow-up in about 6 weeks    Patient Instructions  Humalog MIX 12 units in am and every week if supper time sugar over 200 go up 2 units  HIUMALOG 5 UNITS AT supper         Elayne Snare 04/25/2020, 11:57 AM   Note: This office note was prepared with Dragon voice recognition system technology. Any transcriptional errors that result from this process are unintentional.

## 2020-04-25 NOTE — Patient Instructions (Signed)
Humalog MIX 12 units in am and every week if supper time sugar over 200 go up 2 units  HIUMALOG 5 UNITS AT supper

## 2020-04-26 ENCOUNTER — Ambulatory Visit: Payer: Medicare Other | Admitting: Hematology

## 2020-04-30 NOTE — Telephone Encounter (Signed)
Spoke with patient's niece. She is looking for safety bed rails for patient's [facility] siz bed. Message sent to Skeet Latch at [employer] to see if they sell these and if insurance will cover.

## 2020-05-07 ENCOUNTER — Other Ambulatory Visit: Payer: Self-pay | Admitting: Internal Medicine

## 2020-05-07 ENCOUNTER — Other Ambulatory Visit: Payer: Self-pay | Admitting: Endocrinology

## 2020-05-07 DIAGNOSIS — R413 Other amnesia: Secondary | ICD-10-CM

## 2020-05-07 DIAGNOSIS — L439 Lichen planus, unspecified: Secondary | ICD-10-CM

## 2020-05-08 NOTE — Telephone Encounter (Signed)
Talked to pt's niece, [name], about scheduling an appt. Stated pt is having problems with her hearing. Stated she would like to schedule Mrs. Gerhart an appt.- call transferred to the front office. Appt schedule 4/20 with Dr Laural Golden.

## 2020-05-08 NOTE — Telephone Encounter (Signed)
Please route the keppra to the neurology office.

## 2020-05-09 ENCOUNTER — Inpatient Hospital Stay: Payer: Medicare Other

## 2020-05-09 ENCOUNTER — Other Ambulatory Visit: Payer: Self-pay

## 2020-05-09 DIAGNOSIS — K31819 Angiodysplasia of stomach and duodenum without bleeding: Secondary | ICD-10-CM

## 2020-05-09 DIAGNOSIS — Z95828 Presence of other vascular implants and grafts: Secondary | ICD-10-CM

## 2020-05-09 DIAGNOSIS — I780 Hereditary hemorrhagic telangiectasia: Secondary | ICD-10-CM | POA: Diagnosis not present

## 2020-05-09 DIAGNOSIS — D500 Iron deficiency anemia secondary to blood loss (chronic): Secondary | ICD-10-CM

## 2020-05-09 LAB — CBC WITH DIFFERENTIAL (CANCER CENTER ONLY)
Abs Immature Granulocytes: 0.01 10*3/uL (ref 0.00–0.07)
Basophils Absolute: 0 10*3/uL (ref 0.0–0.1)
Basophils Relative: 1 %
Eosinophils Absolute: 0.5 10*3/uL (ref 0.0–0.5)
Eosinophils Relative: 9 %
HCT: 39.7 % (ref 36.0–46.0)
Hemoglobin: 13.4 g/dL (ref 12.0–15.0)
Immature Granulocytes: 0 %
Lymphocytes Relative: 17 %
Lymphs Abs: 1 10*3/uL (ref 0.7–4.0)
MCH: 30.5 pg (ref 26.0–34.0)
MCHC: 33.8 g/dL (ref 30.0–36.0)
MCV: 90.2 fL (ref 80.0–100.0)
Monocytes Absolute: 0.6 10*3/uL (ref 0.1–1.0)
Monocytes Relative: 11 %
Neutro Abs: 3.4 10*3/uL (ref 1.7–7.7)
Neutrophils Relative %: 62 %
Platelet Count: 140 10*3/uL — ABNORMAL LOW (ref 150–400)
RBC: 4.4 MIL/uL (ref 3.87–5.11)
RDW: 12.9 % (ref 11.5–15.5)
WBC Count: 5.5 10*3/uL (ref 4.0–10.5)
nRBC: 0 % (ref 0.0–0.2)

## 2020-05-09 LAB — FERRITIN: Ferritin: 331 ng/mL — ABNORMAL HIGH (ref 11–307)

## 2020-05-09 LAB — TOTAL PROTEIN, URINE DIPSTICK: Protein, ur: NEGATIVE mg/dL

## 2020-05-09 MED ORDER — SODIUM CHLORIDE 0.9% FLUSH
10.0000 mL | INTRAVENOUS | Status: DC | PRN
  Administered 2020-05-09: 10 mL
  Filled 2020-05-09: qty 10

## 2020-05-09 MED ORDER — HEPARIN SOD (PORK) LOCK FLUSH 100 UNIT/ML IV SOLN
500.0000 [IU] | Freq: Once | INTRAVENOUS | Status: AC | PRN
  Administered 2020-05-09: 500 [IU]
  Filled 2020-05-09: qty 5

## 2020-05-15 ENCOUNTER — Telehealth: Payer: Self-pay

## 2020-05-15 NOTE — Telephone Encounter (Signed)
-----   Message from Alla Feeling, NP sent at 05/14/2020  1:27 PM EDT ----- Please let her know Hg is normal, no additional treatments for now. Return 4/28 for lab, f/up, and infusion.  Thanks, Regan Rakers, NP

## 2020-05-15 NOTE — Telephone Encounter (Signed)
Called spoke with pts niece made her aware of lab results and follow up no questions asked call ended

## 2020-05-23 ENCOUNTER — Other Ambulatory Visit: Payer: Medicare Other

## 2020-05-23 ENCOUNTER — Ambulatory Visit: Payer: Medicare Other | Admitting: Hematology

## 2020-05-29 ENCOUNTER — Ambulatory Visit (INDEPENDENT_AMBULATORY_CARE_PROVIDER_SITE_OTHER): Payer: Medicare Other | Admitting: Internal Medicine

## 2020-05-29 ENCOUNTER — Other Ambulatory Visit: Payer: Self-pay

## 2020-05-29 VITALS — BP 132/49 | HR 50 | Temp 98.4°F | Wt 139.2 lb

## 2020-05-29 DIAGNOSIS — L602 Onychogryphosis: Secondary | ICD-10-CM

## 2020-05-29 DIAGNOSIS — H9190 Unspecified hearing loss, unspecified ear: Secondary | ICD-10-CM | POA: Insufficient documentation

## 2020-05-29 DIAGNOSIS — H9193 Unspecified hearing loss, bilateral: Secondary | ICD-10-CM

## 2020-05-29 NOTE — Patient Instructions (Signed)
Thank you for allowing [place] to provide your care today. Today we discussed your hearing and toe nails.   I have put in a referral to audiology and ENT to further evaluate your hearing loss.  Please follow-up in 3 months.    Should you have any questions or concerns please call the internal medicine clinic at [phone].

## 2020-05-29 NOTE — Progress Notes (Signed)
Internal Medicine Clinic Attending  I saw and evaluated the patient.  I personally confirmed the key portions of the history and exam documented by Dr. Laural Golden and I reviewed pertinent patient test results.  The assessment, diagnosis, and plan were formulated together and I agree with the documentation in the resident's note.   Severely decurved toenails R foot have been causing pain with shoes and walking.  Because of curvature she has not been able to trim them herself (as she does her other nails which are not decurved).  Nails were carefully reduced with nippers.  Significant subungual debris removed.  Tolerated well, no disruption of skin integrity, though nails were nearly embedded in distal plantar toe skin prior to reduction.

## 2020-05-29 NOTE — Progress Notes (Signed)
CC: hearing loss  HPI:  [name] is a 80 y.o. with a past medical history below senting for evaluation of hearing loss and overgrown toenails. For details of today's visit and the status of his chronic medical issues please refer to the assessment and plan.   Past Medical History:  Diagnosis Date  . Chronic anemia   . Chronic diastolic CHF (congestive heart failure) ([place]) 10/03/2013  . Chronic GI bleeding    [name] 11/29/2014  . Family history of anesthesia complication    "niece has a hard time coming out" (09/15/2012)  . Frequent nosebleeds    chronic  . Gastric AV malformation    [name] 11/29/2014  . GERD (gastroesophageal reflux disease)   . 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 History of blood transfusion "several"  . History of epistaxis   . HTN (hypertension), benign 03/02/2012  . Hyperlipidemia   . Iron deficiency anemia    chronic infusions"  . Lichen planus    Both lower extremities  . 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 Overgrown toenails 03/18/2017  . Pneumonia 1990's X 2  . Pulmonary HTN ([place]) 04/01/2017   PASP 93mmHg on echo 09/2016 and 58mmHg by echo 2019  . Seizures ([place]) 09/2014  . Symptomatic anemia 11/29/2014  . Telangiectasia    Gastric   . 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   Review of Systems:   Review of Systems  HENT: Positive for hearing loss. Negative for ear discharge, ear pain and tinnitus.   Respiratory: Negative for cough and shortness of breath.   Cardiovascular: Negative for chest pain and leg swelling.     Physical Exam:  Vitals:   05/29/20 1312  BP: (!) 132/49  Pulse: (!) 50  Temp: 98.4 F (36.9 C)  TempSrc: Oral  Weight: 139 lb 3.2 oz (63.1 kg)   Physical Exam Vitals reviewed.  Constitutional:      General: She is not in acute distress.    Appearance: Normal appearance. She is not ill-appearing.  HENT:     Right Ear: Tympanic membrane, ear canal and  external ear normal. There is no impacted cerumen.     Left Ear: Tympanic membrane, ear canal and external ear normal. There is no impacted cerumen.  Cardiovascular:     Rate and Rhythm: Normal rate and regular rhythm.     Pulses: Normal pulses.     Heart sounds: Murmur heard.  No friction rub. No gallop.   Pulmonary:     Effort: Pulmonary effort is normal. No respiratory distress.     Breath sounds: Normal breath sounds. No wheezing or rales.  Abdominal:     General: Abdomen is flat. Bowel sounds are normal. There is no distension.     Palpations: Abdomen is soft.     Tenderness: There is no abdominal tenderness. There is no guarding.  Musculoskeletal:        General: No swelling or tenderness.     Right lower leg: No edema.     Left lower leg: No edema.  Skin:    General: Skin is warm and dry.  Neurological:     Mental Status: She is alert and oriented to person, place, and time.  Psychiatric:        Mood and Affect: Mood normal.        Behavior: Behavior normal.        Thought Content: Thought content  normal.        Judgment: Judgment normal.     Assessment & Plan:   See Encounters Tab for problem based charting.  Patient discussed with Dr. Jimmye Norman.

## 2020-05-29 NOTE — Assessment & Plan Note (Signed)
Patient presents today for evaluation of her toenails and assistance with clipping.  On exam right foot toenails are overgrown with significant curvature underneath the toe.  Patient was able to cut her left toenails.  Dr. Jimmye Norman to assist clipping toenails today.  We will refer to podiatry for long-term care.

## 2020-05-29 NOTE — Assessment & Plan Note (Signed)
Patient presents today to have her ears evaluated.  Patient's niece reports that she has been having difficulty hearing mostly on phone calls and frequently asking family to repeat themselves.  This has been an ongoing issue for several months patient denies any ear pain, ear drainage or tinnitus.  On exam, tympanic membranes visualized and appear normal.  No serum impaction appreciated.  Will refer to audiology and ENT for further evaluation for hearing loss.

## 2020-05-30 NOTE — Addendum Note (Signed)
Addended by: Mike Craze on: 05/30/2020 09:31 AM   Modules accepted: Orders

## 2020-06-06 ENCOUNTER — Encounter: Payer: Self-pay | Admitting: Hematology

## 2020-06-06 ENCOUNTER — Inpatient Hospital Stay (HOSPITAL_BASED_OUTPATIENT_CLINIC_OR_DEPARTMENT_OTHER): Payer: Medicare Other | Admitting: Hematology

## 2020-06-06 ENCOUNTER — Other Ambulatory Visit: Payer: Self-pay

## 2020-06-06 ENCOUNTER — Inpatient Hospital Stay: Payer: Medicare Other

## 2020-06-06 ENCOUNTER — Other Ambulatory Visit: Payer: Self-pay | Admitting: Internal Medicine

## 2020-06-06 ENCOUNTER — Inpatient Hospital Stay: Payer: Medicare Other | Attending: Hematology

## 2020-06-06 ENCOUNTER — Other Ambulatory Visit: Payer: Medicare Other

## 2020-06-06 VITALS — BP 110/51 | HR 44 | Temp 97.8°F | Resp 18 | Ht 63.0 in | Wt 138.9 lb

## 2020-06-06 DIAGNOSIS — F0390 Unspecified dementia without behavioral disturbance: Secondary | ICD-10-CM | POA: Diagnosis not present

## 2020-06-06 DIAGNOSIS — I509 Heart failure, unspecified: Secondary | ICD-10-CM | POA: Insufficient documentation

## 2020-06-06 DIAGNOSIS — Z79899 Other long term (current) drug therapy: Secondary | ICD-10-CM | POA: Diagnosis not present

## 2020-06-06 DIAGNOSIS — K31811 Angiodysplasia of stomach and duodenum with bleeding: Secondary | ICD-10-CM

## 2020-06-06 DIAGNOSIS — Z794 Long term (current) use of insulin: Secondary | ICD-10-CM | POA: Insufficient documentation

## 2020-06-06 DIAGNOSIS — D500 Iron deficiency anemia secondary to blood loss (chronic): Secondary | ICD-10-CM | POA: Diagnosis not present

## 2020-06-06 DIAGNOSIS — M7989 Other specified soft tissue disorders: Secondary | ICD-10-CM | POA: Insufficient documentation

## 2020-06-06 DIAGNOSIS — I780 Hereditary hemorrhagic telangiectasia: Secondary | ICD-10-CM

## 2020-06-06 DIAGNOSIS — N183 Chronic kidney disease, stage 3 unspecified: Secondary | ICD-10-CM | POA: Insufficient documentation

## 2020-06-06 DIAGNOSIS — K922 Gastrointestinal hemorrhage, unspecified: Secondary | ICD-10-CM | POA: Diagnosis not present

## 2020-06-06 DIAGNOSIS — E1122 Type 2 diabetes mellitus with diabetic chronic kidney disease: Secondary | ICD-10-CM | POA: Insufficient documentation

## 2020-06-06 DIAGNOSIS — I130 Hypertensive heart and chronic kidney disease with heart failure and stage 1 through stage 4 chronic kidney disease, or unspecified chronic kidney disease: Secondary | ICD-10-CM | POA: Insufficient documentation

## 2020-06-06 DIAGNOSIS — Z5112 Encounter for antineoplastic immunotherapy: Secondary | ICD-10-CM | POA: Diagnosis not present

## 2020-06-06 DIAGNOSIS — K31819 Angiodysplasia of stomach and duodenum without bleeding: Secondary | ICD-10-CM

## 2020-06-06 DIAGNOSIS — Q2739 Arteriovenous malformation, other site: Secondary | ICD-10-CM | POA: Insufficient documentation

## 2020-06-06 DIAGNOSIS — D509 Iron deficiency anemia, unspecified: Secondary | ICD-10-CM

## 2020-06-06 LAB — CMP (CANCER CENTER ONLY)
ALT: 89 U/L — ABNORMAL HIGH (ref 0–44)
AST: 67 U/L — ABNORMAL HIGH (ref 15–41)
Albumin: 3.3 g/dL — ABNORMAL LOW (ref 3.5–5.0)
Alkaline Phosphatase: 256 U/L — ABNORMAL HIGH (ref 38–126)
Anion gap: 9 (ref 5–15)
BUN: 30 mg/dL — ABNORMAL HIGH (ref 8–23)
CO2: 27 mmol/L (ref 22–32)
Calcium: 9.5 mg/dL (ref 8.9–10.3)
Chloride: 104 mmol/L (ref 98–111)
Creatinine: 2 mg/dL — ABNORMAL HIGH (ref 0.44–1.00)
GFR, Estimated: 25 mL/min — ABNORMAL LOW (ref >60)
Glucose, Bld: 259 mg/dL — ABNORMAL HIGH (ref 70–99)
Potassium: 5.2 mmol/L — ABNORMAL HIGH (ref 3.5–5.1)
Sodium: 140 mmol/L (ref 135–145)
Total Bilirubin: 0.5 mg/dL (ref 0.3–1.2)
Total Protein: 6.6 g/dL (ref 6.5–8.1)

## 2020-06-06 LAB — CBC WITH DIFFERENTIAL (CANCER CENTER ONLY)
Abs Immature Granulocytes: 0.02 10*3/uL (ref 0.00–0.07)
Basophils Absolute: 0 10*3/uL (ref 0.0–0.1)
Basophils Relative: 1 %
Eosinophils Absolute: 0.5 10*3/uL (ref 0.0–0.5)
Eosinophils Relative: 8 %
HCT: 37 % (ref 36.0–46.0)
Hemoglobin: 12.6 g/dL (ref 12.0–15.0)
Immature Granulocytes: 0 %
Lymphocytes Relative: 15 %
Lymphs Abs: 0.9 10*3/uL (ref 0.7–4.0)
MCH: 30.1 pg (ref 26.0–34.0)
MCHC: 34.1 g/dL (ref 30.0–36.0)
MCV: 88.5 fL (ref 80.0–100.0)
Monocytes Absolute: 0.7 10*3/uL (ref 0.1–1.0)
Monocytes Relative: 12 %
Neutro Abs: 3.7 10*3/uL (ref 1.7–7.7)
Neutrophils Relative %: 64 %
Platelet Count: 135 10*3/uL — ABNORMAL LOW (ref 150–400)
RBC: 4.18 MIL/uL (ref 3.87–5.11)
RDW: 12.8 % (ref 11.5–15.5)
WBC Count: 5.8 10*3/uL (ref 4.0–10.5)
nRBC: 0 % (ref 0.0–0.2)

## 2020-06-06 LAB — FERRITIN: Ferritin: 178 ng/mL (ref 11–307)

## 2020-06-06 MED ORDER — SODIUM CHLORIDE 0.9 % IV SOLN: 510.0000 mg | Freq: Once | INTRAVENOUS | Status: DC

## 2020-06-06 MED ORDER — SODIUM CHLORIDE 0.9 % IV SOLN
4.6000 mg/kg | Freq: Once | INTRAVENOUS | Status: AC
  Administered 2020-06-06: 300 mg via INTRAVENOUS
  Filled 2020-06-06: qty 12

## 2020-06-06 MED ORDER — SODIUM CHLORIDE 0.9 % IV SOLN
Freq: Once | INTRAVENOUS | Status: AC
Start: 2020-06-06 — End: 2020-06-06
  Filled 2020-06-06: qty 250

## 2020-06-06 MED ORDER — SODIUM CHLORIDE 0.9% FLUSH
10.0000 mL | INTRAVENOUS | Status: DC | PRN
  Administered 2020-06-06: 10 mL
  Filled 2020-06-06: qty 10

## 2020-06-06 MED ORDER — HEPARIN SOD (PORK) LOCK FLUSH 100 UNIT/ML IV SOLN
500.0000 [IU] | Freq: Once | INTRAVENOUS | Status: AC | PRN
  Administered 2020-06-06: 500 [IU]
  Filled 2020-06-06: qty 5

## 2020-06-06 NOTE — Progress Notes (Signed)
Dr. Burr Medico aware of elevated LFT's and creatinine. No new orders received. Advised to hold Feraheme today. Patient aware of plan of care.

## 2020-06-06 NOTE — Patient Instructions (Signed)

## 2020-06-06 NOTE — Progress Notes (Signed)
Patient will not receive Feraheme today.  Ferritin >100.  T.O. Dr Lavonda Jumbo, PharmD

## 2020-06-06 NOTE — Patient Instructions (Signed)
[facility]  Discharge Instructions: °Thank you for choosing [facility] to provide your oncology and hematology care.  ° °If you have a lab appointment with the Cancer Center, please go directly to the Cancer Center and check in at the registration area. °  °Wear comfortable clothing and clothing appropriate for easy access to any Portacath or PICC line.  ° °We strive to give you quality time with your provider. You may need to reschedule your appointment if you arrive late (15 or more minutes).  Arriving late affects you and other patients whose appointments are after yours.  Also, if you miss three or more appointments without notifying the office, you may be dismissed from the clinic at the provider’s discretion.    °  °For prescription refill requests, have your pharmacy contact our office and allow 72 hours for refills to be completed.   ° °Today you received the following chemotherapy and/or immunotherapy agents: bevacizumab    °  °To help prevent nausea and vomiting after your treatment, we encourage you to take your nausea medication as directed. ° °BELOW ARE SYMPTOMS THAT SHOULD BE REPORTED IMMEDIATELY: °*FEVER GREATER THAN 100.4 F (38 °C) OR HIGHER °*CHILLS OR SWEATING °*NAUSEA AND VOMITING THAT IS NOT CONTROLLED WITH YOUR NAUSEA MEDICATION °*UNUSUAL SHORTNESS OF BREATH °*UNUSUAL BRUISING OR BLEEDING °*URINARY PROBLEMS (pain or burning when urinating, or frequent urination) °*BOWEL PROBLEMS (unusual diarrhea, constipation, pain near the anus) °TENDERNESS IN MOUTH AND THROAT WITH OR WITHOUT PRESENCE OF ULCERS (sore throat, sores in mouth, or a toothache) °UNUSUAL RASH, SWELLING OR PAIN  °UNUSUAL VAGINAL DISCHARGE OR ITCHING  ° °Items with * indicate a potential emergency and should be followed up as soon as possible or go to the Emergency Department if any problems should occur. ° °Please show the CHEMOTHERAPY ALERT CARD or IMMUNOTHERAPY ALERT CARD at check-in  to the Emergency Department and triage nurse. ° °Should you have questions after your visit or need to cancel or reschedule your appointment, please contact [facility]  Dept: [phone]  and follow the prompts.  Office hours are 8:00 a.m. to 4:30 p.m. Monday - Friday. Please note that voicemails left after 4:00 p.m. may not be returned until the following business day.  We are closed weekends and major holidays. You have access to a nurse at all times for urgent questions. Please call the main number to the clinic Dept: [phone] and follow the prompts. ° ° °For any non-urgent questions, you may also contact your provider using MyChart. We now offer e-Visits for anyone 18 and older to request care online for non-urgent symptoms. For details visit [email or web address]. °  °Also download the MyChart app! Go to the app store, search "MyChart", open the app, select [place], and log in with your MyChart username and password. ° °Due to Covid, a mask is required upon entering the hospital/clinic. If you do not have a mask, one will be given to you upon arrival. For doctor visits, patients may have 1 support person aged 18 or older with them. For treatment visits, patients cannot have anyone with them due to current Covid guidelines and our immunocompromised population.  ° °

## 2020-06-06 NOTE — Progress Notes (Signed)
[place]   Telephone:(336) [phone] Fax:([phone]) [phone]   Clinic Follow up Note   Patient Care Team: Delice Bison, DO as PCP - General [name], MD as PCP - Cardiology (Cardiology) Truitt Merle, MD as Consulting Physician (Hematology) Elayne Snare, MD as Consulting Physician (Endocrinology)  Date of Service:  06/06/2020  CHIEF COMPLAINT: f/u of Anemia due to chronic blood loss, HHT  HEMATOLOGY HISTORY 1.Hereditary hemorrhagic telangiectasia (HHT),with epistaxis andfrequentGI bleeding. Previously under care of Dr. [name] 2.Anemia secondary to GI bleeding and iron deficiency, required blood transfusion every 3 to 4 weeks, IV Feraheme every 1 to 2 months  CURRENT THERAPY:  1. Iv feraheme as needed if ferritin<100, latest on11/2021 2.Avastin5mg /kg every 2 weeks, starting3/30/20. Reduced to every 4 weeks starting 11/07/18.Reduced to every 2 months starting 01/02/19.Held 03/09/19-4/16/21due to high proteinuria.Give Avastin as needed 3. Blood transfusion if Hg<8.0  INTERVAL HISTORY:  [name] is here for a follow up of anemia and HHT. She was last seen by me on 10/13/19, and by NP Lacie on 02/01/20 and 04/11/20. She presents to the clinic alone. Her niece brought her here today. I contacted her husband [name] by phone during her appointment today. Her appetite is "not like it used to be." She feels well overall aside from tired today. She ambulates with a walker at home.  All other systems were reviewed with the patient and are negative.  MEDICAL HISTORY:  Past Medical History:  Diagnosis Date  . Chronic anemia   . Chronic diastolic CHF (congestive heart failure) ([place]) 10/03/2013  . Chronic GI bleeding    [name] 11/29/2014  . Family history of anesthesia complication    "niece has a hard time coming out" (09/15/2012)  . Frequent nosebleeds    chronic  . Gastric AV malformation    [name] 11/29/2014  . GERD (gastroesophageal reflux disease)    . 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 History of blood transfusion "several"  . History of epistaxis   . HTN (hypertension), benign 03/02/2012  . Hyperlipidemia   . Iron deficiency anemia    chronic infusions"  . Lichen planus    Both lower extremities  . 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 Overgrown toenails 03/18/2017  . Pneumonia 1990's X 2  . Pulmonary HTN ([place]) 04/01/2017   PASP 28mmHg on echo 09/2016 and 33mmHg by echo 2019  . Seizures ([place]) 09/2014  . Symptomatic anemia 11/29/2014  . Telangiectasia    Gastric   . 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    SURGICAL HISTORY: Past Surgical History:  Procedure Laterality Date  . CATARACT EXTRACTION     "I think it was just one eye"  . ESOPHAGOGASTRODUODENOSCOPY  02/26/2011   Procedure: ESOPHAGOGASTRODUODENOSCOPY (EGD);  Surgeon: [name], MD;  Location: [name];  Service: Endoscopy;  Laterality: N/A;  . ESOPHAGOGASTRODUODENOSCOPY N/A 11/08/2012   Procedure: ESOPHAGOGASTRODUODENOSCOPY (EGD);  Surgeon: [name], MD;  Location: [name];  Service: Endoscopy;  Laterality: N/A;  . ESOPHAGOGASTRODUODENOSCOPY N/A 10/04/2013   Procedure: ESOPHAGOGASTRODUODENOSCOPY (EGD);  Surgeon: Winfield Cunas., MD;  Location: [name];  Service: Endoscopy;  Laterality: N/A;  with APC on stand-by  . ESOPHAGOGASTRODUODENOSCOPY N/A 07/06/2014   Procedure: ESOPHAGOGASTRODUODENOSCOPY (EGD);  Surgeon: Clarene Essex, MD;  Location: [name];  Service: Endoscopy;  Laterality: N/A;  . ESOPHAGOGASTRODUODENOSCOPY N/A 09/05/2014   Procedure: ESOPHAGOGASTRODUODENOSCOPY (EGD);  Surgeon: Laurence Spates, MD;  Location:  [name];  Service: Endoscopy;  Laterality: N/A;  APC on standby to control bleeding  . ESOPHAGOGASTRODUODENOSCOPY N/A 11/29/2014   Procedure: ESOPHAGOGASTRODUODENOSCOPY (EGD);  Surgeon: [name], MD;  Location: [facility] ENDOSCOPY;  Service: Endoscopy;  Laterality:  N/A;  . ESOPHAGOGASTRODUODENOSCOPY N/A 09/28/2015   Procedure: ESOPHAGOGASTRODUODENOSCOPY (EGD);  Surgeon: Clarene Essex, MD;  Location: [facility] ENDOSCOPY;  Service: Endoscopy;  Laterality: N/A;  . ESOPHAGOGASTRODUODENOSCOPY (EGD) WITH PROPOFOL N/A 12/04/2017   Procedure: ESOPHAGOGASTRODUODENOSCOPY (EGD) WITH PROPOFOL;  Surgeon: [name], MD;  Location: [place];  Service: Endoscopy;  Laterality: N/A;  . ESOPHAGOGASTRODUODENOSCOPY ENDOSCOPY  08/19/2006   with laser treatment  . HOT HEMOSTASIS N/A 07/06/2014   Procedure: HOT HEMOSTASIS (ARGON PLASMA COAGULATION/BICAP);  Surgeon: Clarene Essex, MD;  Location: [name];  Service: Endoscopy;  Laterality: N/A;  . HOT HEMOSTASIS N/A 09/28/2015   Procedure: HOT HEMOSTASIS (ARGON PLASMA COAGULATION/BICAP);  Surgeon: Clarene Essex, MD;  Location: [facility] ENDOSCOPY;  Service: Endoscopy;  Laterality: N/A;  . HOT HEMOSTASIS N/A 12/04/2017   Procedure: HOT HEMOSTASIS (ARGON PLASMA COAGULATION/BICAP);  Surgeon: [name], MD;  Location: [place];  Service: Endoscopy;  Laterality: N/A;  . NASAL HEMORRHAGE CONTROL     "for bleeding"   . SAVORY DILATION  02/26/2011   Procedure: SAVORY DILATION;  Surgeon: [name], MD;  Location: [name];  Service: Endoscopy;  Laterality: N/A;  c-arm needed  . SUBMUCOSAL INJECTION  12/04/2017   Procedure: SUBMUCOSAL INJECTION;  Surgeon: [name], MD;  Location: [facility] ENDOSCOPY;  Service: Endoscopy;;    I have reviewed the social history and family history with the patient and they are unchanged from previous note.  ALLERGIES:  is allergic to aspirin.  MEDICATIONS:  Current Outpatient Medications  Medication Sig Dispense Refill  . Accu-Chek Softclix Lancets lancets 1 each by Other route 3 (three) times daily. Use as instructed to check blood sugar 3 times per day dx code E11.65 100 each 3  . amLODipine (NORVASC) 10 MG tablet Take 1 tablet (10 mg total) by mouth daily. 30 tablet 11  . cetaphil  (CETAPHIL) lotion Apply 1 application topically as needed for dry skin. 236 mL 0  . Continuous Blood Gluc Sensor (FREESTYLE LIBRE 2 SENSOR) MISC 2 Devices by Does not apply route every 14 (fourteen) days. 2 each 3  . donepezil (ARICEPT) 10 MG tablet TAKE 1 TABLET BY MOUTH AT BEDTIME 30 tablet 1  . Dulaglutide (TRULICITY) 1.5 YJ/8.5UD SOPN INJECT 1.5MG  SUBCUTANEOUSLY EVERY WEEK ON THURSDAY 2 mL 3  . Elastic Bandages & Supports (MEDICAL COMPRESSION STOCKINGS) MISC Wear as much as possible while awake to reduce swelling 2 each 0  . Ferrous Fumarate (FERROCITE) 324 (106 Fe) MG TABS tablet Take 1 tablet (106 mg of iron total) by mouth every other day. 15 tablet 5  . gabapentin (NEURONTIN) 100 MG capsule TAKE 1 CAPSULE BY MOUTH TWICE DAILY 60 capsule 0  . glucose blood (ACCU-CHEK GUIDE) test strip 1 each by Other route as needed for other. Use as instructed to check blood sugar 3 times per day Dx code E11.65 100 each 3  . HUMALOG MIX 75/25 KWIKPEN (75-25) 100 UNIT/ML Kwikpen INJECT 10 UNITS SUBCUTANEOUSLY ONCE DAILY 9 mL 10  . insulin lispro (HUMALOG) 100 UNIT/ML KwikPen Inject 3 U with lunch and dinner. If eating a breakfast heavy in carbs such as grits or hash browns, also inject 3 U with breakfast. (Patient taking differently: Inject 3 U with lunch and dinner. If eating a breakfast heavy in carbs such as  grits or hash browns, also inject 3 U with breakfast.  IS ONLY TAKING AT NIGHT) 15 mL 2  . Insulin Pen Needle (B-D UF III MINI PEN NEEDLES) 31G X 5 MM MISC USE TO INJECT INSULIN 3 TIMES A DAY 100 each 3  . lactulose (CHRONULAC) 10 GM/15ML solution TAKE 15 ML BY MOUTH TWO TIMES DAILY (Patient not taking: Reported on 04/25/2020) 473 mL 2  . levETIRAcetam (KEPPRA) 500 MG tablet Take 1 tablet (500 mg total) by mouth 2 (two) times daily. 180 tablet 3  . memantine (NAMENDA) 10 MG tablet Take 1 tablet (10 mg total) by mouth 2 (two) times daily. 60 tablet 11  . oxymetazoline (AFRIN) 0.05 % nasal spray Place 1  spray into both nostrils 2 (two) times daily as needed (Epistaxis). 30 mL 0  . pantoprazole (PROTONIX) 40 MG tablet Take 1 tablet by mouth once daily (Patient not taking: No sig reported) 180 tablet 0  . Potassium Chloride ER 20 MEQ TBCR Take 20 mEq by mouth 2 (two) times daily. 60 tablet 5  . potassium chloride SA (KLOR-CON) 20 MEQ tablet TAKE 1 TABLET BY MOUTH TWICE DAILY 60 tablet 1  . torsemide (DEMADEX) 20 MG tablet Take 1 tablet (20 mg total) by mouth daily. 30 tablet 11  . TRESIBA FLEXTOUCH 100 UNIT/ML FlexTouch Pen INJECT 10 UNITS SUBCUTANEOUSLY ONCE DAILY 12 mL 10  . triamcinolone ointment (KENALOG) 0.5 % APPLY 1 APPLICATION TOPICALLY TO RASH TWICE DAILY FOR ITCHING 30 g 0   No current facility-administered medications for this visit.   Facility-Administered Medications Ordered in Other Visits  Medication Dose Route Frequency Provider Last Rate Last Admin  . sodium chloride 0.9 % injection 10 mL  10 mL Intracatheter PRN Truitt Merle, MD      . sodium chloride flush (NS) 0.9 % injection 10 mL  10 mL Intracatheter PRN Truitt Merle, MD   10 mL at 08/18/19 1717    PHYSICAL EXAMINATION: ECOG PERFORMANCE STATUS: 2 - Symptomatic, <50% confined to bed  Vitals:   06/06/20 1119  BP: (!) 110/51  Pulse: (!) 44  Resp: 18  Temp: 97.8 F (36.6 C)  SpO2: 98%   Filed Weights   06/06/20 1119  Weight: 138 lb 14.4 oz (63 kg)    GENERAL:alert, no distress and comfortable SKIN: skin color, texture, turgor are normal, no rashes or significant lesions EYES: normal, Conjunctiva are pink and non-injected, sclera clear  NECK: supple, thyroid normal size, non-tender, without nodularity LYMPH:  no palpable lymphadenopathy in the cervical, axillary  LUNGS: clear to auscultation and percussion with normal breathing effort HEART: regular rate & rhythm and no murmurs; mild lower extremity edema present, tenderness ABDOMEN:abdomen soft, non-tender and normal bowel sounds Musculoskeletal:no cyanosis of  digits and no clubbing  NEURO: alert & oriented x 3 with fluent speech, no focal motor/sensory deficits  LABORATORY DATA:  I have reviewed the data as listed CBC Latest Ref Rng & Units 06/06/2020 05/09/2020 04/11/2020  WBC 4.0 - 10.5 K/uL 5.8 5.5 5.7  Hemoglobin 12.0 - 15.0 g/dL 12.6 13.4 11.7(L)  Hematocrit 36.0 - 46.0 % 37.0 39.7 35.8(L)  Platelets 150 - 400 K/uL 135(L) 140(L) 156     CMP Latest Ref Rng & Units 06/06/2020 04/11/2020 03/28/2020  Glucose 70 - 99 mg/dL 259(H) 227(H) 211(H)  BUN 8 - 23 mg/dL 30(H) 35(H) 15  Creatinine 0.44 - 1.00 mg/dL 2.00(H) 1.75(H) 1.60(H)  Sodium 135 - 145 mmol/L 140 139 140  Potassium 3.5 - 5.1 mmol/L  5.2(H) 4.3 3.8  Chloride 98 - 111 mmol/L 104 103 103  CO2 22 - 32 mmol/L 27 28 30   Calcium 8.9 - 10.3 mg/dL 9.5 9.2 9.4  Total Protein 6.5 - 8.1 g/dL 6.6 6.6 6.3(L)  Total Bilirubin 0.3 - 1.2 mg/dL 0.5 0.4 0.8  Alkaline Phos 38 - 126 U/L 256(H) 216(H) 221(H)  AST 15 - 41 U/L 67(H) 36 25  ALT 0 - 44 U/L 89(H) 32 28      RADIOGRAPHIC STUDIES: I have personally reviewed the radiological images as listed and agreed with the findings in the report. No results found.   ASSESSMENT & PLAN:  [name] is a 80 y.o. female with   1.Hereditary hemorrhagic telangiectasia(HHT)), withepistaxis and frequent GI bleeding -She receives blood transfusion as needed, but has not needed since 04/2018 when she started Beva. -She is on Avastin intermittently/PRN due to high proteinuria. -anemia has most resolved  -will schedule lab monthly, Beva in 3 month, sooner if needed   2.Iron deficiency anemia secondary to #1 -She receives IV Feraheme as needed for Ferritin <100, most recently 04/11/20.  3.Dementia -She takes Ariceptand memantine. On Keppra for seizures.  -f/u with neurology.   5. 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 Dwyane Dee -She has some mild lower extremity swelling today. She is not aware of this being present  before, nor is her husband. -I recommend they monitor this  6. Worsening proteinuria -This is likely secondary to avastin, but she also has diabetes  -She received bevacizumab as needed, most recently 04/11/20   Plan -lab reviewed, will proceed with Bevacizumab today, no need iv iron -lab monthly, iv feraheme if ferritin<100 -f/u and Bevacizumab in 3 months  -I spoke with her husband on the phone today    No problem-specific Assessment & Plan notes found for this encounter.   No orders of the defined types were placed in this encounter.  All questions were answered. The patient knows to call the clinic with any problems, questions or concerns. No barriers to learning was detected. The total time spent in the appointment was 30 minutes.     Truitt Merle, MD 06/06/2020   I, Wilburn Mylar, am acting as scribe for Truitt Merle, MD.   I have reviewed the above documentation for accuracy and completeness, and I agree with the above.

## 2020-06-10 ENCOUNTER — Telehealth: Payer: Self-pay

## 2020-06-10 NOTE — Telephone Encounter (Signed)
Called left message concerning lab results and potassium orders and instructions encouraged pt to call for any questions concerns or changes

## 2020-06-10 NOTE — Telephone Encounter (Signed)
-----   Message from Alla Feeling, NP sent at 06/07/2020  3:34 PM EDT ----- Please let her know K is 5.2 and renal dysfunction is slightly worse. Hold K for 1 week, and hydrate. Be sure she is having regular BM, may need to take lactulose which is already on her med list. After 1 week she can resume normal K dosing. She needs to f/up with PCP for labs.   Thanks, Regan Rakers, NP

## 2020-06-11 ENCOUNTER — Telehealth: Payer: Self-pay | Admitting: Hematology

## 2020-06-11 NOTE — Telephone Encounter (Signed)
Scheduled follow-up appointments per 4/28 los. Patient's niece is aware. Mailed calendar.

## 2020-06-24 ENCOUNTER — Other Ambulatory Visit: Payer: Self-pay

## 2020-06-24 ENCOUNTER — Encounter: Payer: Self-pay | Admitting: Endocrinology

## 2020-06-24 ENCOUNTER — Ambulatory Visit (INDEPENDENT_AMBULATORY_CARE_PROVIDER_SITE_OTHER): Payer: Medicare Other | Admitting: Endocrinology

## 2020-06-24 VITALS — BP 122/72 | HR 53 | Ht 63.0 in | Wt 136.6 lb

## 2020-06-24 DIAGNOSIS — E1165 Type 2 diabetes mellitus with hyperglycemia: Secondary | ICD-10-CM | POA: Diagnosis not present

## 2020-06-24 DIAGNOSIS — E875 Hyperkalemia: Secondary | ICD-10-CM | POA: Diagnosis not present

## 2020-06-24 DIAGNOSIS — Z794 Long term (current) use of insulin: Secondary | ICD-10-CM | POA: Diagnosis not present

## 2020-06-24 NOTE — Patient Instructions (Addendum)
HUMALOG CLEAR 6 units, must take at start of supper meal  12 Units cloudy insulin at breakfast  Stop Potassium

## 2020-06-24 NOTE — Progress Notes (Signed)
Patient ID: [name], female   DOB: 1941/01/21, 80 y.o.   MRN: [id number]           Reason for Appointment: Follow-up for Type 2 Diabetes  Referring PCP: Koleen Distance   History of Present Illness:          Date of diagnosis of type 2 diabetes mellitus: 1988       Background history:   Patient has been on insulin for a few years and she is unclear when this was started, according to Epic she was started on Levemir in 2015 Prior to that likely had been on various oral hypoglycemic agents including metformin, glipizide and Tradjenta Her level of control has been somewhat variable but not consistently poor; her A1c has ranged between 5.6 and 7.6  Recent history:   Her A1c is last 7.1 compared to 7.5, previously was 8.4  Previous fructosamine 256  INSULIN regimen is: Humalog mix 12 units before breakfast and Humalog 4 units mostly before dinner    Non-insulin hypoglycemic drugs prescribed: Trulicity 1.5 mg weekly  Current management, blood sugar patterns and problems identified:  She was switched from [place] to Humalog mix but is unclear whether she is taking [place] or not  Although her husband is helping her now with her insulin he does not know what insulin she takes  She likely takes most of her Humalog dose in the evening when she checks her sugar after eating and not before eating  Her blood sugars are on an average the same as on the last visit  No consistent blood sugar pattern is seen and as before periodically she will have significantly high readings for 2 or 3 days at a time and then better readings  Will blood sugars are generally better in the mornings  Also sporadically have a good reading around 6 PM or bedtime but highest blood sugars are at bedtime  No hypoglycemia with lowest blood sugar 94 in the morning  She has been regular with Trulicity which she takes every Monday  She was advised to try and do the freestyle libre sensor and prescription was  sent to [facility] health care but she is still not understanding how this would be used and has not wanted to start it        Side effects from medications have been: None known     Meal times are:  Breakfast is at variable times between 8 AM-9  Dinner 7 pm  Typical meal intake: Breakfast is Cheerios or raisin Bran               Exercise:  Unable to  Glucose monitoring:  Checking up to 6 times a day       Glucometer: Accu-Chek    Blood Glucose readings with readings for the last week and averages for the last 30 days   PRE-MEAL Fasting Lunch Dinner Bedtime Overall  Glucose range:     94-365  Mean/median:  180    251 227   POST-MEAL PC Breakfast PC Lunch PC Dinner  Glucose range:      Mean/median:  240     Previous readings:  PRE-MEAL Fasting Lunch Dinner Bedtime Overall  Glucose range:  100-285    140-399   Mean/median:  175  226  239  246 227   POST-MEAL PC Breakfast PC Lunch PC Dinner  Glucose range:    158-296  Mean/median:    246      Dietician visit, most recent:  11/2018  Weight history:  Wt Readings from Last 3 Encounters:  06/24/20 136 lb 9.6 oz (62 kg)  06/06/20 138 lb 14.4 oz (63 kg)  05/29/20 139 lb 3.2 oz (63.1 kg)    Glycemic control:   Lab Results  Component Value Date   HGBA1C 7.1 (A) 04/25/2020   HGBA1C 7.5 (A) 02/01/2020   HGBA1C 8.4 (H) 10/31/2019   Lab Results  Component Value Date   MICROALBUR 1,289.4 (H) 03/07/2019   LDLCALC 57 01/30/2019   CREATININE 2.00 (H) 06/06/2020   Lab Results  Component Value Date   MICRALBCREAT 5 03/28/2018    Lab Results  Component Value Date   FRUCTOSAMINE 404 (H) 10/31/2019   FRUCTOSAMINE 256 06/26/2019    No visits with results within 1 Week(s) from this visit.  Latest known visit with results is:  Infusion on 06/06/2020  Component Date Value Ref Range Status  . Ferritin 06/06/2020 178  11 - 307 ng/mL Final   Performed at [facility], [place] [address], [place],  [place] [address]  . Sodium 06/06/2020 140  135 - 145 mmol/L Final  . Potassium 06/06/2020 5.2* 3.5 - 5.1 mmol/L Final  . Chloride 06/06/2020 104  98 - 111 mmol/L Final  . CO2 06/06/2020 27  22 - 32 mmol/L Final  . Glucose, Bld 06/06/2020 259* 70 - 99 mg/dL Final   Glucose reference range applies only to samples taken after fasting for at least 8 hours.  . BUN 06/06/2020 30* 8 - 23 mg/dL Final  . Creatinine 06/06/2020 2.00* 0.44 - 1.00 mg/dL Final  . Calcium 06/06/2020 9.5  8.9 - 10.3 mg/dL Final  . Total Protein 06/06/2020 6.6  6.5 - 8.1 g/dL Final  . Albumin 06/06/2020 3.3* 3.5 - 5.0 g/dL Final  . AST 06/06/2020 67* 15 - 41 U/L Final  . ALT 06/06/2020 89* 0 - 44 U/L Final  . Alkaline Phosphatase 06/06/2020 256* 38 - 126 U/L Final  . Total Bilirubin 06/06/2020 0.5  0.3 - 1.2 mg/dL Final  . GFR, Estimated 06/06/2020 25* >60 mL/min Final   Comment: (NOTE) Calculated using the CKD-EPI Creatinine Equation (2021)   . Anion gap 06/06/2020 9  5 - 15 Final   Performed at [facility], [place] [address] [place], [place] [address]  . WBC Count 06/06/2020 5.8  4.0 - 10.5 K/uL Final  . RBC 06/06/2020 4.18  3.87 - 5.11 MIL/uL Final  . Hemoglobin 06/06/2020 12.6  12.0 - 15.0 g/dL Final  . HCT 06/06/2020 37.0  36.0 - 46.0 % Final  . MCV 06/06/2020 88.5  80.0 - 100.0 fL Final  . MCH 06/06/2020 30.1  26.0 - 34.0 pg Final  . MCHC 06/06/2020 34.1  30.0 - 36.0 g/dL Final  . RDW 06/06/2020 12.8  11.5 - 15.5 % Final  . Platelet Count 06/06/2020 135* 150 - 400 K/uL Final  . nRBC 06/06/2020 0.0  0.0 - 0.2 % Final  . Neutrophils Relative % 06/06/2020 64  % Final  . Neutro Abs 06/06/2020 3.7  1.7 - 7.7 K/uL Final  . Lymphocytes Relative 06/06/2020 15  % Final  . Lymphs Abs 06/06/2020 0.9  0.7 - 4.0 K/uL Final  . Monocytes Relative 06/06/2020 12  % Final  . Monocytes Absolute 06/06/2020 0.7  0.1 - 1.0 K/uL Final  . Eosinophils Relative 06/06/2020 8  % Final  . Eosinophils Absolute  06/06/2020 0.5  0.0 - 0.5 K/uL Final  . Basophils Relative 06/06/2020  1  % Final  . Basophils Absolute 06/06/2020 0.0  0.0 - 0.1 K/uL Final  . Immature Granulocytes 06/06/2020 0  % Final  . Abs Immature Granulocytes 06/06/2020 0.02  0.00 - 0.07 K/uL Final   Performed at [facility], [place] [address] [place], [place] [address]    Allergies as of 06/24/2020      Reactions   Aspirin Nausea And Vomiting      Medication List       Accurate as of Jun 24, 2020 10:53 AM. If you have any questions, ask your nurse or doctor.        Accu-Chek Guide test strip Generic drug: glucose blood 1 each by Other route as needed for other. Use as instructed to check blood sugar 3 times per day Dx code E11.65   Accu-Chek Softclix Lancets lancets 1 each by Other route 3 (three) times daily. Use as instructed to check blood sugar 3 times per day dx code E11.65   amLODipine 10 MG tablet Commonly known as: NORVASC Take 1 tablet (10 mg total) by mouth daily.   B-D UF III MINI PEN NEEDLES 31G X 5 MM Misc Generic drug: Insulin Pen Needle USE TO INJECT INSULIN 3 TIMES A DAY   cetaphil lotion Apply 1 application topically as needed for dry skin.   donepezil 10 MG tablet Commonly known as: ARICEPT TAKE 1 TABLET BY MOUTH AT BEDTIME   Ferrous Fumarate 324 (106 Fe) MG Tabs tablet Commonly known as: Ferrocite Take 1 tablet (106 mg of iron total) by mouth every other day.   FreeStyle Libre 2 Sensor Misc 2 Devices by Does not apply route every 14 (fourteen) days.   gabapentin 100 MG capsule Commonly known as: NEURONTIN TAKE 1 CAPSULE BY MOUTH TWICE DAILY   HumaLOG Mix 75/25 KwikPen (75-25) 100 UNIT/ML Kwikpen Generic drug: Insulin Lispro Prot & Lispro INJECT 10 UNITS SUBCUTANEOUSLY ONCE DAILY   insulin lispro 100 UNIT/ML KwikPen Commonly known as: HUMALOG INJECT 3 UNITS SUBCUTANEOUSLY TWICE DAILY WITH LUNCH AND DINNER; IF HEAVY CARB BREAKFAST ALSO INJECT 3 UNITS    lactulose 10 GM/15ML solution Commonly known as: CHRONULAC TAKE 15 ML BY MOUTH TWO TIMES DAILY   levETIRAcetam 500 MG tablet Commonly known as: KEPPRA TAKE 1 TABLET BY MOUTH TWICE DAILY   Medical Compression Stockings Misc Wear as much as possible while awake to reduce swelling   memantine 10 MG tablet Commonly known as: NAMENDA Take 1 tablet (10 mg total) by mouth 2 (two) times daily.   oxymetazoline 0.05 % nasal spray Commonly known as: AFRIN Place 1 spray into both nostrils 2 (two) times daily as needed (Epistaxis).   pantoprazole 40 MG tablet Commonly known as: PROTONIX Take 1 tablet by mouth once daily   Potassium Chloride ER 20 MEQ Tbcr Take 20 mEq by mouth 2 (two) times daily.   potassium chloride SA 20 MEQ tablet Commonly known as: KLOR-CON TAKE 1 TABLET BY MOUTH TWICE DAILY   torsemide 20 MG tablet Commonly known as: DEMADEX Take 1 tablet (20 mg total) by mouth daily.   Tyler Aas FlexTouch 100 UNIT/ML FlexTouch Pen Generic drug: insulin degludec INJECT 10 UNITS SUBCUTANEOUSLY ONCE DAILY   triamcinolone ointment 0.5 % Commonly known as: KENALOG APPLY 1 APPLICATION TOPICALLY TO RASH TWICE DAILY FOR ITCHING   Trulicity 1.5 NI/7.7OE Sopn Generic drug: Dulaglutide INJECT 1.5MG  SUBCUTANEOUSLY EVERY WEEK ON THURSDAY       Allergies:  Allergies  Allergen Reactions  . Aspirin Nausea  And Vomiting    Past Medical History:  Diagnosis Date  . Chronic anemia   . Chronic diastolic CHF (congestive heart failure) ([place]) 10/03/2013  . Chronic GI bleeding    [name] 11/29/2014  . Family history of anesthesia complication    "niece has a hard time coming out" (09/15/2012)  . Frequent nosebleeds    chronic  . Gastric AV malformation    [name] 11/29/2014  . GERD (gastroesophageal reflux disease)   . 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 History of blood transfusion "several"  . History of epistaxis   . HTN (hypertension), benign 03/02/2012  .  Hyperlipidemia   . Iron deficiency anemia    chronic infusions"  . Lichen planus    Both lower extremities  . 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 Overgrown toenails 03/18/2017  . Pneumonia 1990's X 2  . Pulmonary HTN ([place]) 04/01/2017   PASP 23mmHg on echo 09/2016 and 37mmHg by echo 2019  . Seizures ([place]) 09/2014  . Symptomatic anemia 11/29/2014  . Telangiectasia    Gastric   . 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    Past Surgical History:  Procedure Laterality Date  . CATARACT EXTRACTION     "I think it was just one eye"  . ESOPHAGOGASTRODUODENOSCOPY  02/26/2011   Procedure: ESOPHAGOGASTRODUODENOSCOPY (EGD);  Surgeon: [name], MD;  Location: [name];  Service: Endoscopy;  Laterality: N/A;  . ESOPHAGOGASTRODUODENOSCOPY N/A 11/08/2012   Procedure: ESOPHAGOGASTRODUODENOSCOPY (EGD);  Surgeon: [name], MD;  Location: [name];  Service: Endoscopy;  Laterality: N/A;  . ESOPHAGOGASTRODUODENOSCOPY N/A 10/04/2013   Procedure: ESOPHAGOGASTRODUODENOSCOPY (EGD);  Surgeon: Winfield Cunas., MD;  Location: [name];  Service: Endoscopy;  Laterality: N/A;  with APC on stand-by  . ESOPHAGOGASTRODUODENOSCOPY N/A 07/06/2014   Procedure: ESOPHAGOGASTRODUODENOSCOPY (EGD);  Surgeon: Clarene Essex, MD;  Location: [name];  Service: Endoscopy;  Laterality: N/A;  . ESOPHAGOGASTRODUODENOSCOPY N/A 09/05/2014   Procedure: ESOPHAGOGASTRODUODENOSCOPY (EGD);  Surgeon: Laurence Spates, MD;  Location: [name];  Service: Endoscopy;  Laterality: N/A;  APC on standby to control bleeding  . ESOPHAGOGASTRODUODENOSCOPY N/A 11/29/2014   Procedure: ESOPHAGOGASTRODUODENOSCOPY (EGD);  Surgeon: [name], MD;  Location: [facility] ENDOSCOPY;  Service: Endoscopy;  Laterality: N/A;  . ESOPHAGOGASTRODUODENOSCOPY N/A 09/28/2015   Procedure: ESOPHAGOGASTRODUODENOSCOPY (EGD);  Surgeon: Clarene Essex, MD;  Location: [facility] ENDOSCOPY;  Service: Endoscopy;  Laterality: N/A;  .  ESOPHAGOGASTRODUODENOSCOPY (EGD) WITH PROPOFOL N/A 12/04/2017   Procedure: ESOPHAGOGASTRODUODENOSCOPY (EGD) WITH PROPOFOL;  Surgeon: [name], MD;  Location: [place];  Service: Endoscopy;  Laterality: N/A;  . ESOPHAGOGASTRODUODENOSCOPY ENDOSCOPY  08/19/2006   with laser treatment  . HOT HEMOSTASIS N/A 07/06/2014   Procedure: HOT HEMOSTASIS (ARGON PLASMA COAGULATION/BICAP);  Surgeon: Clarene Essex, MD;  Location: [name];  Service: Endoscopy;  Laterality: N/A;  . HOT HEMOSTASIS N/A 09/28/2015   Procedure: HOT HEMOSTASIS (ARGON PLASMA COAGULATION/BICAP);  Surgeon: Clarene Essex, MD;  Location: [facility] ENDOSCOPY;  Service: Endoscopy;  Laterality: N/A;  . HOT HEMOSTASIS N/A 12/04/2017   Procedure: HOT HEMOSTASIS (ARGON PLASMA COAGULATION/BICAP);  Surgeon: [name], MD;  Location: [place];  Service: Endoscopy;  Laterality: N/A;  . NASAL HEMORRHAGE CONTROL     "for bleeding"   . SAVORY DILATION  02/26/2011   Procedure: SAVORY DILATION;  Surgeon: [name], MD;  Location: [name];  Service: Endoscopy;  Laterality: N/A;  c-arm needed  . SUBMUCOSAL INJECTION  12/04/2017   Procedure:  SUBMUCOSAL INJECTION;  Surgeon: [name], MD;  Location: [facility] ENDOSCOPY;  Service: Endoscopy;;    Family History  Problem Relation Age of Onset  . Stroke Father   . Healthy Mother   . Breast cancer Other   . Diabetes Niece   . Malignant hyperthermia Neg Hx   . Seizures Neg Hx     Social History:  reports that she quit smoking about 49 years ago. Her smoking use included cigarettes. She has a 20.00 pack-year smoking history. She has never used smokeless tobacco. She reports that she does not drink alcohol and does not use drugs.   Review of Systems   Lipid history: As below    Lab Results  Component Value Date   CHOL 133 01/30/2019   HDL 41.30 01/30/2019   LDLCALC 57 01/30/2019   TRIG 176.0 (H) 01/30/2019   CHOLHDL 3 01/30/2019           Hypertension: Does not appear  to be taking any medications currently  BP Readings from Last 3 Encounters:  06/24/20 122/72  06/06/20 (!) 110/51  05/29/20 (!) 132/49   Renal function history: Has CKD of unclear etiology  She is on Demadex prescribed by PCP However her last potassium was mildly increased with potassium supplements  Lab Results  Component Value Date   CREATININE 2.00 (H) 06/06/2020   CREATININE 1.75 (H) 04/11/2020   CREATININE 1.60 (H) 03/28/2020    Most recent eye exam was in 07/2018  Most recent foot exam: 06/2018 by podiatrist with no evidence of sensory loss or reduction in sensation   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, microalbuminuria  LABS:  No visits with results within 1 Week(s) from this visit.  Latest known visit with results is:  Infusion on 06/06/2020  Component Date Value Ref Range Status  . Ferritin 06/06/2020 178  11 - 307 ng/mL Final   Performed at [facility], [place] [address], [place], [place] [address]  . Sodium 06/06/2020 140  135 - 145 mmol/L Final  . Potassium 06/06/2020 5.2* 3.5 - 5.1 mmol/L Final  . Chloride 06/06/2020 104  98 - 111 mmol/L Final  . CO2 06/06/2020 27  22 - 32 mmol/L Final  . Glucose, Bld 06/06/2020 259* 70 - 99 mg/dL Final   Glucose reference range applies only to samples taken after fasting for at least 8 hours.  . BUN 06/06/2020 30* 8 - 23 mg/dL Final  . Creatinine 06/06/2020 2.00* 0.44 - 1.00 mg/dL Final  . Calcium 06/06/2020 9.5  8.9 - 10.3 mg/dL Final  . Total Protein 06/06/2020 6.6  6.5 - 8.1 g/dL Final  . Albumin 06/06/2020 3.3* 3.5 - 5.0 g/dL Final  . AST 06/06/2020 67* 15 - 41 U/L Final  . ALT 06/06/2020 89* 0 - 44 U/L Final  . Alkaline Phosphatase 06/06/2020 256* 38 - 126 U/L Final  . Total Bilirubin 06/06/2020 0.5  0.3 - 1.2 mg/dL Final  . GFR, Estimated 06/06/2020 25* >60 mL/min Final   Comment: (NOTE) Calculated using the CKD-EPI Creatinine Equation (2021)   . Anion gap 06/06/2020 9  5 - 15 Final    Performed at [facility], [place] [address], [place], [place] [address]  . WBC Count 06/06/2020 5.8  4.0 - 10.5 K/uL Final  . RBC 06/06/2020 4.18  3.87 - 5.11 MIL/uL Final  . Hemoglobin 06/06/2020 12.6  12.0 - 15.0 g/dL Final  . HCT 06/06/2020 37.0  36.0 - 46.0 % Final  .  MCV 06/06/2020 88.5  80.0 - 100.0 fL Final  . MCH 06/06/2020 30.1  26.0 - 34.0 pg Final  . MCHC 06/06/2020 34.1  30.0 - 36.0 g/dL Final  . RDW 06/06/2020 12.8  11.5 - 15.5 % Final  . Platelet Count 06/06/2020 135* 150 - 400 K/uL Final  . nRBC 06/06/2020 0.0  0.0 - 0.2 % Final  . Neutrophils Relative % 06/06/2020 64  % Final  . Neutro Abs 06/06/2020 3.7  1.7 - 7.7 K/uL Final  . Lymphocytes Relative 06/06/2020 15  % Final  . Lymphs Abs 06/06/2020 0.9  0.7 - 4.0 K/uL Final  . Monocytes Relative 06/06/2020 12  % Final  . Monocytes Absolute 06/06/2020 0.7  0.1 - 1.0 K/uL Final  . Eosinophils Relative 06/06/2020 8  % Final  . Eosinophils Absolute 06/06/2020 0.5  0.0 - 0.5 K/uL Final  . Basophils Relative 06/06/2020 1  % Final  . Basophils Absolute 06/06/2020 0.0  0.0 - 0.1 K/uL Final  . Immature Granulocytes 06/06/2020 0  % Final  . Abs Immature Granulocytes 06/06/2020 0.02  0.00 - 0.07 K/uL Final   Performed at [facility], [place] [address], [place], [place] [address]    Physical Examination:  BP 122/72   Pulse (!) 53   Ht 5\' 3"  (1.6 m)   Wt 136 lb 9.6 oz (62 kg)   LMP  (LMP Unknown)   SpO2 94%   BMI 24.20 kg/m    ASSESSMENT:  Diabetes type 2 on insulin  See history of present illness for detailed discussion of current diabetes management, blood sugar patterns and problems identified  A1c is last 7.1  She is currently on Trulicity 1.5 mg, Humalog 5 units at dinner and Humalog mix 12 units in the morning Her A1c is previously inaccurate because of anemia  Blood sugars are again quite erratic and generally higher postprandially Because of her dementia she  is relying on family members for her insulin However she appears to be taking her evening Humalog mostly after eating and difficult to obtain an accurate history now Mealtimes and food intake are quite variable also No hypoglycemia and her fasting readings are not quite as high Likely has higher readings in the morning because of significantly high reading late at night Although she has relatively high readings late afternoon she does have a few readings below 150 also and unlikely we can increase her morning Humalog mix further  Weight is stable   Hypertension: Blood pressure is normal without medications  Worsening renal function: We will defer management to PCP  PLAN:    She needs to take 6 units of Humalog before starting to eat and preferably when she is ready to eat She will take the insulin regardless of Premeal blood sugar Explained to her in detail the benefits of CGM and how this would be useful for her as well as alerting her for high and low readings She will consider this She will also make sure she is taking the proper insulin regimen and her husband will call back to report this  Discussed blood sugar targets at various times A1c and fructosamine on the next visit  Hyperkalemia: She will stop potassium supplements because of potassium of 5.2 and recheck on the next visit  There are no Patient Instructions on file for this visit.        Elayne Snare 06/24/2020, 10:53 AM   Note: This office note was prepared with Dragon voice recognition system  technology. Any transcriptional errors that result from this process are unintentional.

## 2020-07-09 ENCOUNTER — Other Ambulatory Visit: Payer: Self-pay

## 2020-07-09 ENCOUNTER — Inpatient Hospital Stay: Payer: Medicare Other | Attending: Hematology

## 2020-07-09 DIAGNOSIS — I780 Hereditary hemorrhagic telangiectasia: Secondary | ICD-10-CM | POA: Diagnosis not present

## 2020-07-09 DIAGNOSIS — K31819 Angiodysplasia of stomach and duodenum without bleeding: Secondary | ICD-10-CM

## 2020-07-09 DIAGNOSIS — Z862 Personal history of diseases of the blood and blood-forming organs and certain disorders involving the immune mechanism: Secondary | ICD-10-CM | POA: Diagnosis not present

## 2020-07-09 DIAGNOSIS — D500 Iron deficiency anemia secondary to blood loss (chronic): Secondary | ICD-10-CM

## 2020-07-09 DIAGNOSIS — Z95828 Presence of other vascular implants and grafts: Secondary | ICD-10-CM

## 2020-07-09 LAB — CBC WITH DIFFERENTIAL (CANCER CENTER ONLY)
Abs Immature Granulocytes: 0.01 10*3/uL (ref 0.00–0.07)
Basophils Absolute: 0 10*3/uL (ref 0.0–0.1)
Basophils Relative: 1 %
Eosinophils Absolute: 0.5 10*3/uL (ref 0.0–0.5)
Eosinophils Relative: 9 %
HCT: 36.7 % (ref 36.0–46.0)
Hemoglobin: 12.1 g/dL (ref 12.0–15.0)
Immature Granulocytes: 0 %
Lymphocytes Relative: 19 %
Lymphs Abs: 1 10*3/uL (ref 0.7–4.0)
MCH: 29.7 pg (ref 26.0–34.0)
MCHC: 33 g/dL (ref 30.0–36.0)
MCV: 90.2 fL (ref 80.0–100.0)
Monocytes Absolute: 0.7 10*3/uL (ref 0.1–1.0)
Monocytes Relative: 12 %
Neutro Abs: 3.4 10*3/uL (ref 1.7–7.7)
Neutrophils Relative %: 59 %
Platelet Count: 151 10*3/uL (ref 150–400)
RBC: 4.07 MIL/uL (ref 3.87–5.11)
RDW: 13.4 % (ref 11.5–15.5)
WBC Count: 5.6 10*3/uL (ref 4.0–10.5)
nRBC: 0 % (ref 0.0–0.2)

## 2020-07-09 LAB — TOTAL PROTEIN, URINE DIPSTICK

## 2020-07-09 LAB — FERRITIN: Ferritin: 103 ng/mL (ref 11–307)

## 2020-07-09 MED ORDER — SODIUM CHLORIDE 0.9% FLUSH
10.0000 mL | INTRAVENOUS | Status: DC | PRN
  Administered 2020-07-09: 10 mL
  Filled 2020-07-09: qty 10

## 2020-07-09 MED ORDER — HEPARIN SOD (PORK) LOCK FLUSH 100 UNIT/ML IV SOLN
500.0000 [IU] | Freq: Once | INTRAVENOUS | Status: AC | PRN
  Administered 2020-07-09: 500 [IU]
  Filled 2020-07-09: qty 5

## 2020-07-11 ENCOUNTER — Telehealth: Payer: Self-pay

## 2020-07-11 NOTE — Telephone Encounter (Signed)
This nurse spoke with patients niece, [name].  Informed that anemia has resolved and iron studies are normal.  No need for iron, blood or Avastin right now.  Reminded of lab appt on 6/28 and follow up with Cira Rue, NP will be in July.    No further questions or concerns at this time.

## 2020-07-11 NOTE — Telephone Encounter (Signed)
-----   Message from Alla Feeling, NP sent at 07/11/2020  2:51 PM EDT ----- Please let her know labs look very good. Anemia resolved, iron studies are normal. No need for iron, blood, or avastin right now. Next lab 6/28 and f/up in July.  Thanks, Regan Rakers, NP

## 2020-07-15 DIAGNOSIS — H6122 Impacted cerumen, left ear: Secondary | ICD-10-CM | POA: Insufficient documentation

## 2020-07-19 ENCOUNTER — Encounter: Payer: Self-pay | Admitting: *Deleted

## 2020-07-29 ENCOUNTER — Other Ambulatory Visit: Payer: Self-pay | Admitting: Endocrinology

## 2020-07-29 DIAGNOSIS — E1165 Type 2 diabetes mellitus with hyperglycemia: Secondary | ICD-10-CM

## 2020-08-06 ENCOUNTER — Other Ambulatory Visit: Payer: Self-pay

## 2020-08-06 ENCOUNTER — Inpatient Hospital Stay: Payer: Medicare Other | Attending: Hematology

## 2020-08-06 DIAGNOSIS — D500 Iron deficiency anemia secondary to blood loss (chronic): Secondary | ICD-10-CM | POA: Diagnosis not present

## 2020-08-06 DIAGNOSIS — I780 Hereditary hemorrhagic telangiectasia: Secondary | ICD-10-CM | POA: Insufficient documentation

## 2020-08-06 DIAGNOSIS — K922 Gastrointestinal hemorrhage, unspecified: Secondary | ICD-10-CM | POA: Diagnosis not present

## 2020-08-06 DIAGNOSIS — Z452 Encounter for adjustment and management of vascular access device: Secondary | ICD-10-CM | POA: Insufficient documentation

## 2020-08-06 DIAGNOSIS — K31819 Angiodysplasia of stomach and duodenum without bleeding: Secondary | ICD-10-CM

## 2020-08-06 DIAGNOSIS — Z95828 Presence of other vascular implants and grafts: Secondary | ICD-10-CM

## 2020-08-06 LAB — CBC WITH DIFFERENTIAL (CANCER CENTER ONLY)
Abs Immature Granulocytes: 0.01 10*3/uL (ref 0.00–0.07)
Basophils Absolute: 0 10*3/uL (ref 0.0–0.1)
Basophils Relative: 1 %
Eosinophils Absolute: 0.4 10*3/uL (ref 0.0–0.5)
Eosinophils Relative: 7 %
HCT: 32.4 % — ABNORMAL LOW (ref 36.0–46.0)
Hemoglobin: 10.8 g/dL — ABNORMAL LOW (ref 12.0–15.0)
Immature Granulocytes: 0 %
Lymphocytes Relative: 18 %
Lymphs Abs: 1 10*3/uL (ref 0.7–4.0)
MCH: 30.2 pg (ref 26.0–34.0)
MCHC: 33.3 g/dL (ref 30.0–36.0)
MCV: 90.5 fL (ref 80.0–100.0)
Monocytes Absolute: 0.7 10*3/uL (ref 0.1–1.0)
Monocytes Relative: 12 %
Neutro Abs: 3.3 10*3/uL (ref 1.7–7.7)
Neutrophils Relative %: 62 %
Platelet Count: 139 10*3/uL — ABNORMAL LOW (ref 150–400)
RBC: 3.58 MIL/uL — ABNORMAL LOW (ref 3.87–5.11)
RDW: 13.7 % (ref 11.5–15.5)
WBC Count: 5.4 10*3/uL (ref 4.0–10.5)
nRBC: 0 % (ref 0.0–0.2)

## 2020-08-06 LAB — CMP (CANCER CENTER ONLY)
ALT: 29 U/L (ref 0–44)
AST: 38 U/L (ref 15–41)
Albumin: 3.3 g/dL — ABNORMAL LOW (ref 3.5–5.0)
Alkaline Phosphatase: 246 U/L — ABNORMAL HIGH (ref 38–126)
Anion gap: 10 (ref 5–15)
BUN: 30 mg/dL — ABNORMAL HIGH (ref 8–23)
CO2: 29 mmol/L (ref 22–32)
Calcium: 9.5 mg/dL (ref 8.9–10.3)
Chloride: 105 mmol/L (ref 98–111)
Creatinine: 2.14 mg/dL — ABNORMAL HIGH (ref 0.44–1.00)
GFR, Estimated: 23 mL/min — ABNORMAL LOW (ref >60)
Glucose, Bld: 478 mg/dL — ABNORMAL HIGH (ref 70–99)
Potassium: 4 mmol/L (ref 3.5–5.1)
Sodium: 144 mmol/L (ref 135–145)
Total Bilirubin: 0.6 mg/dL (ref 0.3–1.2)
Total Protein: 6.7 g/dL (ref 6.5–8.1)

## 2020-08-06 LAB — FERRITIN: Ferritin: 65 ng/mL (ref 11–307)

## 2020-08-06 MED ORDER — HEPARIN SOD (PORK) LOCK FLUSH 100 UNIT/ML IV SOLN
500.0000 [IU] | Freq: Once | INTRAVENOUS | Status: AC | PRN
Start: 2020-08-06 — End: 2020-08-06
  Administered 2020-08-06: 500 [IU]
  Filled 2020-08-06: qty 5

## 2020-08-06 MED ORDER — SODIUM CHLORIDE 0.9% FLUSH
10.0000 mL | INTRAVENOUS | Status: DC | PRN
  Administered 2020-08-06: 10 mL
  Filled 2020-08-06: qty 10

## 2020-08-06 NOTE — Progress Notes (Signed)
Patient presented today for portflush and labs and wanted to know if Dr. Burr Medico can approve for her to have a handicap parking placard. Patient asked if so can the application be mailed to her home.  This nurse spoke with MD who completed the form.  This nurse put signed application in the mail to patient today.

## 2020-08-07 ENCOUNTER — Telehealth: Payer: Self-pay

## 2020-08-07 ENCOUNTER — Telehealth: Payer: Self-pay | Admitting: Hematology

## 2020-08-07 NOTE — Telephone Encounter (Signed)
I contacted Irving Burton (niece) to relay the lab results per DPR. She was made aware that the patient's BG was [id number] as of yesterday and stated that she would reach out to Dr. Dwyane Dee.

## 2020-08-07 NOTE — Telephone Encounter (Signed)
Scheduled appt epr 6/28 sch msg. Pt's niece is aware.

## 2020-08-07 NOTE — Telephone Encounter (Signed)
-----   Message from Alla Feeling, NP sent at 08/06/2020  1:04 PM EDT ----- Caryl Asp, please call to let Ms. Luba know she has recurrent mild anemia, and ferritin <100. I recommend feraheme and bevacizumab this week or next, depends on scheduling. I send schedule msg.   Please also let her know BG is 478 today and please f/up with Dr. Dwyane Dee ASAP.   Thanks, Regan Rakers, NP

## 2020-08-08 ENCOUNTER — Other Ambulatory Visit: Payer: Self-pay | Admitting: Endocrinology

## 2020-08-09 ENCOUNTER — Ambulatory Visit: Payer: Medicare Other | Admitting: Podiatry

## 2020-08-13 ENCOUNTER — Inpatient Hospital Stay: Payer: Medicare Other | Attending: Hematology

## 2020-08-13 ENCOUNTER — Other Ambulatory Visit: Payer: Self-pay

## 2020-08-13 ENCOUNTER — Encounter: Payer: Self-pay | Admitting: *Deleted

## 2020-08-13 VITALS — BP 136/45 | HR 49 | Temp 98.0°F | Resp 18 | Wt 144.5 lb

## 2020-08-13 DIAGNOSIS — D509 Iron deficiency anemia, unspecified: Secondary | ICD-10-CM

## 2020-08-13 DIAGNOSIS — D500 Iron deficiency anemia secondary to blood loss (chronic): Secondary | ICD-10-CM | POA: Diagnosis not present

## 2020-08-13 DIAGNOSIS — K922 Gastrointestinal hemorrhage, unspecified: Secondary | ICD-10-CM | POA: Insufficient documentation

## 2020-08-13 DIAGNOSIS — Z79899 Other long term (current) drug therapy: Secondary | ICD-10-CM | POA: Insufficient documentation

## 2020-08-13 DIAGNOSIS — I780 Hereditary hemorrhagic telangiectasia: Secondary | ICD-10-CM | POA: Diagnosis not present

## 2020-08-13 DIAGNOSIS — K31819 Angiodysplasia of stomach and duodenum without bleeding: Secondary | ICD-10-CM

## 2020-08-13 DIAGNOSIS — Z5112 Encounter for antineoplastic immunotherapy: Secondary | ICD-10-CM | POA: Diagnosis present

## 2020-08-13 DIAGNOSIS — K31811 Angiodysplasia of stomach and duodenum with bleeding: Secondary | ICD-10-CM

## 2020-08-13 MED ORDER — SODIUM CHLORIDE 0.9 % IV SOLN
4.6000 mg/kg | Freq: Once | INTRAVENOUS | Status: AC
  Administered 2020-08-13: 300 mg via INTRAVENOUS
  Filled 2020-08-13: qty 12

## 2020-08-13 MED ORDER — SODIUM CHLORIDE 0.9 % IV SOLN
510.0000 mg | Freq: Once | INTRAVENOUS | Status: AC
  Administered 2020-08-13: 510 mg via INTRAVENOUS
  Filled 2020-08-13: qty 510

## 2020-08-13 MED ORDER — HEPARIN SOD (PORK) LOCK FLUSH 100 UNIT/ML IV SOLN
500.0000 [IU] | Freq: Once | INTRAVENOUS | Status: DC | PRN
  Filled 2020-08-13: qty 5

## 2020-08-13 MED ORDER — SODIUM CHLORIDE 0.9 % IV SOLN
Freq: Once | INTRAVENOUS | Status: AC
Start: 2020-08-13 — End: 2020-08-13
  Filled 2020-08-13: qty 250

## 2020-08-13 MED ORDER — SODIUM CHLORIDE 0.9% FLUSH
10.0000 mL | INTRAVENOUS | Status: DC | PRN
  Filled 2020-08-13: qty 10

## 2020-08-13 NOTE — Progress Notes (Signed)
Per Cira Rue, NP okay to treat with most recent labs and urine protein.

## 2020-08-13 NOTE — Patient Instructions (Addendum)
[facility]  Discharge Instructions: Thank you for choosing [facility] to provide your oncology and hematology care.   If you have a lab appointment with the Cancer Center, please go directly to the Cancer Center and check in at the registration area.   Wear comfortable clothing and clothing appropriate for easy access to any Portacath or PICC line.   We strive to give you quality time with your provider. You may need to reschedule your appointment if you arrive late (15 or more minutes).  Arriving late affects you and other patients whose appointments are after yours.  Also, if you miss three or more appointments without notifying the office, you may be dismissed from the clinic at the provider's discretion.      For prescription refill requests, have your pharmacy contact our office and allow 72 hours for refills to be completed.    Today you received the following chemotherapy and/or immunotherapy agents : Bevacizumab     To help prevent nausea and vomiting after your treatment, we encourage you to take your nausea medication as directed.  BELOW ARE SYMPTOMS THAT SHOULD BE REPORTED IMMEDIATELY: *FEVER GREATER THAN 100.4 F (38 C) OR HIGHER *CHILLS OR SWEATING *NAUSEA AND VOMITING THAT IS NOT CONTROLLED WITH YOUR NAUSEA MEDICATION *UNUSUAL SHORTNESS OF BREATH *UNUSUAL BRUISING OR BLEEDING *URINARY PROBLEMS (pain or burning when urinating, or frequent urination) *BOWEL PROBLEMS (unusual diarrhea, constipation, pain near the anus) TENDERNESS IN MOUTH AND THROAT WITH OR WITHOUT PRESENCE OF ULCERS (sore throat, sores in mouth, or a toothache) UNUSUAL RASH, SWELLING OR PAIN  UNUSUAL VAGINAL DISCHARGE OR ITCHING   Items with * indicate a potential emergency and should be followed up as soon as possible or go to the Emergency Department if any problems should occur.  Please show the CHEMOTHERAPY ALERT CARD or IMMUNOTHERAPY ALERT CARD at check-in  to the Emergency Department and triage nurse.  Should you have questions after your visit or need to cancel or reschedule your appointment, please contact [facility]  Dept: [phone]  and follow the prompts.  Office hours are 8:00 a.m. to 4:30 p.m. Monday - Friday. Please note that voicemails left after 4:00 p.m. may not be returned until the following business day.  We are closed weekends and major holidays. You have access to a nurse at all times for urgent questions. Please call the main number to the clinic Dept: [phone] and follow the prompts.   For any non-urgent questions, you may also contact your provider using MyChart. We now offer e-Visits for anyone 18 and older to request care online for non-urgent symptoms. For details visit [email or web address].   Also download the MyChart app! Go to the app store, search "MyChart", open the app, select [facility], and log in with your MyChart username and password.  Due to Covid, a mask is required upon entering the hospital/clinic. If you do not have a mask, one will be given to you upon arrival. For doctor visits, patients may have 1 support person aged 18 or older with them. For treatment visits, patients cannot have anyone with them due to current Covid guidelines and our immunocompromised population.   Ferumoxytol injection What is this medication? FERUMOXYTOL is an iron complex. Iron is used to make healthy red blood cells, which carry oxygen and nutrients throughout the body. This medicine is used totreat iron deficiency anemia. This medicine may be used for other purposes; ask your health care provider   orpharmacist if you have questions. COMMON BRAND NAME(S): Feraheme What should I tell my care team before I take this medication? They need to know if you have any of these conditions: anemia not caused by low iron levels high levels of iron in the blood magnetic resonance imaging (MRI) test  scheduled an unusual or allergic reaction to iron, other medicines, foods, dyes, or preservatives pregnant or trying to get pregnant breast-feeding How should I use this medication? This medicine is for injection into a vein. It is given by a health careprofessional in a hospital or clinic setting. Talk to your pediatrician regarding the use of this medicine in children.Special care may be needed. Overdosage: If you think you have taken too much of this medicine contact apoison control center or emergency room at once. NOTE: This medicine is only for you. Do not share this medicine with others. What if I miss a dose? It is important not to miss your dose. Call your doctor or health careprofessional if you are unable to keep an appointment. What may interact with this medication? This medicine may interact with the following medications: other iron products This list may not describe all possible interactions. Give your health care provider a list of all the medicines, herbs, non-prescription drugs, or dietary supplements you use. Also tell them if you smoke, drink alcohol, or use illegaldrugs. Some items may interact with your medicine. What should I watch for while using this medication? Visit your doctor or healthcare professional regularly. Tell your doctor or healthcare professional if your symptoms do not start to get better or if theyget worse. You may need blood work done while you are taking this medicine. You may need to follow a special diet. Talk to your doctor. Foods that contain iron include: whole grains/cereals, dried fruits, beans, or peas, leafy greenvegetables, and organ meats (liver, kidney). What side effects may I notice from receiving this medication? Side effects that you should report to your doctor or health care professionalas soon as possible: allergic reactions like skin rash, itching or hives, swelling of the face, lips, or tongue breathing problems changes in blood  pressure feeling faint or lightheaded, falls fever or chills flushing, sweating, or hot feelings swelling of the ankles or feet Side effects that usually do not require medical attention (report to yourdoctor or health care professional if they continue or are bothersome): diarrhea headache nausea, vomiting stomach pain This list may not describe all possible side effects. Call your doctor for medical advice about side effects. You may report side effects to FDA at1-800-FDA-1088. Where should I keep my medication? This drug is given in a hospital or clinic and will not be stored at home. NOTE: This sheet is a summary. It may not cover all possible information. If you have questions about this medicine, talk to your doctor, pharmacist, orhealth care provider.  2022 Elsevier/Gold Standard (2016-03-16 20:21:10)  

## 2020-08-15 ENCOUNTER — Other Ambulatory Visit: Payer: Self-pay | Admitting: Internal Medicine

## 2020-08-15 DIAGNOSIS — L439 Lichen planus, unspecified: Secondary | ICD-10-CM

## 2020-08-27 ENCOUNTER — Ambulatory Visit: Payer: Medicare Other | Admitting: Endocrinology

## 2020-08-28 ENCOUNTER — Other Ambulatory Visit: Payer: Self-pay

## 2020-08-28 ENCOUNTER — Ambulatory Visit (INDEPENDENT_AMBULATORY_CARE_PROVIDER_SITE_OTHER): Payer: Medicare Other | Admitting: Endocrinology

## 2020-08-28 ENCOUNTER — Encounter: Payer: Self-pay | Admitting: Endocrinology

## 2020-08-28 VITALS — BP 130/64 | HR 52 | Ht 63.0 in | Wt 149.0 lb

## 2020-08-28 DIAGNOSIS — Z794 Long term (current) use of insulin: Secondary | ICD-10-CM

## 2020-08-28 DIAGNOSIS — E1165 Type 2 diabetes mellitus with hyperglycemia: Secondary | ICD-10-CM | POA: Diagnosis not present

## 2020-08-28 DIAGNOSIS — N184 Chronic kidney disease, stage 4 (severe): Secondary | ICD-10-CM | POA: Diagnosis not present

## 2020-08-28 LAB — POCT GLYCOSYLATED HEMOGLOBIN (HGB A1C): Hemoglobin A1C: 6.7 % — AB (ref 4.0–5.6)

## 2020-08-28 NOTE — Patient Instructions (Signed)
At supper if eating low starchy meal cut Humalog to 4 units  Reduce Humalog Mix, cloudy to 10 U in am

## 2020-08-28 NOTE — Progress Notes (Signed)
Patient ID: [name], female   DOB: 28-Nov-1940, 80 y.o.   MRN: [id number]           Reason for Appointment: Follow-up for Type 2 Diabetes  Referring PCP: Koleen Distance   History of Present Illness:          Date of diagnosis of type 2 diabetes mellitus: 1988       Background history:   Patient has been on insulin for a few years and she is unclear when this was started, according to Epic she was started on Levemir in 2015 Prior to that likely had been on various oral hypoglycemic agents including metformin, glipizide and Tradjenta Her level of control has been somewhat variable but not consistently poor; her A1c has ranged between 5.6 and 7.6  Recent history:   Her A1c is improving at 6.7, was 7.1 Previous fructosamine 256  INSULIN regimen is: Humalog mix 12 units before breakfast and Humalog 6 units before dinner    Non-insulin hypoglycemic drugs prescribed: Trulicity 1.5 mg weekly  Current management, blood sugar patterns and problems identified: Again her husband is helping her with her insulin The patient is reportedly taking the proper insulin types and doses He confirms that he is trying to make sure she is taking her insulin before starting to eat breakfast and dinner Not know what insulin she takes Previously would take her Humalog dose in the evening when she checks her sugar after eating  Overall her blood sugars are fluctuating considerably especially in the evenings over the last month She does appear to have some high readings around 6-8 PM but has no consistent monitoring time and less often before supper recently Yesterday she did have a full breakfast with some protein and grits but her blood sugar was low at about 1 PM down to 56 Subsequently after dinner blood sugar was 231 with eating hotdogs She has had sporadic hypoglycemia, mostly after supper but less recently  She has been regular with Trulicity which she takes every Monday Her weight has gone up         Side effects from medications have been: None known     Meal times are:  Breakfast is at variable times between 8 AM-9  Dinner 7 pm  Typical meal intake: Breakfast is Cheerios or raisin Bran               Exercise:  Unable to  Glucose monitoring:  Checking up to 6 times a day       Glucometer: Accu-Chek    Blood Glucose readings with readings for the last week and averages for the last 30 days   PRE-MEAL Fasting Lunch Dinner Bedtime Overall  Glucose range: 73-259    51-475  Mean/median: 180 165 248 205 190   Previously:  PRE-MEAL Fasting Lunch Dinner Bedtime Overall  Glucose range:     94-365  Mean/median:  180    251 227   POST-MEAL PC Breakfast PC Lunch PC Dinner  Glucose range:      Mean/median:  240      Dietician visit, most recent: 11/2018  Weight history:  Wt Readings from Last 3 Encounters:  08/28/20 149 lb (67.6 kg)  08/13/20 144 lb 8 oz (65.5 kg)  06/24/20 136 lb 9.6 oz (62 kg)    Glycemic control:   Lab Results  Component Value Date   HGBA1C 6.7 (A) 08/28/2020   HGBA1C 7.1 (A) 04/25/2020   HGBA1C 7.5 (A) 02/01/2020   Lab  Results  Component Value Date   MICROALBUR 1,289.4 (H) 03/07/2019   LDLCALC 57 01/30/2019   CREATININE 2.14 (H) 08/06/2020   Lab Results  Component Value Date   MICRALBCREAT 5 03/28/2018    Lab Results  Component Value Date   FRUCTOSAMINE 404 (H) 10/31/2019   FRUCTOSAMINE 256 06/26/2019    Office Visit on 08/28/2020  Component Date Value Ref Range Status   Hemoglobin A1C 08/28/2020 6.7 (A) 4.0 - 5.6 % Final    Allergies as of 08/28/2020       Reactions   Aspirin Nausea And Vomiting        Medication List        Accurate as of August 28, 2020 11:59 PM. If you have any questions, ask your nurse or doctor.          Accu-Chek Guide test strip Generic drug: glucose blood USE AS INSTRUCTED TO CHECK BLOOD SUGAR 3 TIMES DAILY.   Accu-Chek Softclix Lancets lancets 1 each by Other route 3 (three) times  daily. Use as instructed to check blood sugar 3 times per day dx code E11.65   amLODipine 10 MG tablet Commonly known as: NORVASC Take 1 tablet (10 mg total) by mouth daily.   B-D UF III MINI PEN NEEDLES 31G X 5 MM Misc Generic drug: Insulin Pen Needle USE TO INJECT INSULIN 3 TIMES A DAY   cetaphil lotion Apply 1 application topically as needed for dry skin.   donepezil 10 MG tablet Commonly known as: ARICEPT TAKE 1 TABLET BY MOUTH AT BEDTIME   Ferrous Fumarate 324 (106 Fe) MG Tabs tablet Commonly known as: Ferrocite Take 1 tablet (106 mg of iron total) by mouth every other day.   FreeStyle Libre 2 Sensor Misc 2 Devices by Does not apply route every 14 (fourteen) days.   gabapentin 100 MG capsule Commonly known as: NEURONTIN TAKE 1 CAPSULE BY MOUTH TWICE DAILY   HumaLOG Mix 75/25 KwikPen (75-25) 100 UNIT/ML Kwikpen Generic drug: Insulin Lispro Prot & Lispro INJECT 10 UNITS SUBCUTANEOUSLY ONCE DAILY   insulin lispro 100 UNIT/ML KwikPen Commonly known as: HUMALOG INJECT 3 UNITS SUBCUTANEOUSLY TWICE DAILY WITH LUNCH AND DINNER; IF HEAVY CARB BREAKFAST ALSO INJECT 3 UNITS   lactulose 10 GM/15ML solution Commonly known as: CHRONULAC TAKE 15 ML BY MOUTH TWO TIMES DAILY   levETIRAcetam 500 MG tablet Commonly known as: KEPPRA TAKE 1 TABLET BY MOUTH TWICE DAILY   Medical Compression Stockings Misc Wear as much as possible while awake to reduce swelling   memantine 10 MG tablet Commonly known as: NAMENDA Take 1 tablet (10 mg total) by mouth 2 (two) times daily.   oxymetazoline 0.05 % nasal spray Commonly known as: AFRIN Place 1 spray into both nostrils 2 (two) times daily as needed (Epistaxis).   pantoprazole 40 MG tablet Commonly known as: PROTONIX Take 1 tablet by mouth once daily   Potassium Chloride ER 20 MEQ Tbcr Take 20 mEq by mouth 2 (two) times daily.   potassium chloride SA 20 MEQ tablet Commonly known as: KLOR-CON TAKE 1 TABLET BY MOUTH TWICE DAILY    torsemide 20 MG tablet Commonly known as: DEMADEX Take 1 tablet (20 mg total) by mouth daily.   Tyler Aas FlexTouch 100 UNIT/ML FlexTouch Pen Generic drug: insulin degludec INJECT 10 UNITS SUBCUTANEOUSLY ONCE DAILY   triamcinolone ointment 0.5 % Commonly known as: KENALOG APPLY 1 APPLICATION TOPICALLY TO RASH TWICE DAILY FOR ITCHING   Trulicity 1.5 XN/1.7GY Sopn Generic drug: Dulaglutide INJECT 1.5MG   SUBCUTANEOUSLY EVERY WEEK ON THURSDAY        Allergies:  Allergies  Allergen Reactions   Aspirin Nausea And Vomiting    Past Medical History:  Diagnosis Date   Chronic anemia    Chronic diastolic CHF (congestive heart failure) ([place]) 10/03/2013   Chronic GI bleeding    /notes 11/29/2014   Family history of anesthesia complication    "niece has a hard time coming out" (09/15/2012)   Frequent nosebleeds    chronic   Gastric AV malformation    /notes 11/29/2014   GERD (gastroesophageal reflux disease)    Heart murmur 04/01/2017   Moderate 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    Past Surgical History:  Procedure Laterality Date   CATARACT EXTRACTION     "I think it was just one eye"   ESOPHAGOGASTRODUODENOSCOPY  02/26/2011   Procedure: ESOPHAGOGASTRODUODENOSCOPY (EGD);  Surgeon: [name], MD;  Location: [name];  Service: Endoscopy;  Laterality: N/A;   ESOPHAGOGASTRODUODENOSCOPY N/A 11/08/2012   Procedure: ESOPHAGOGASTRODUODENOSCOPY  (EGD);  Surgeon: [name], MD;  Location: [name];  Service: Endoscopy;  Laterality: N/A;   ESOPHAGOGASTRODUODENOSCOPY N/A 10/04/2013   Procedure: ESOPHAGOGASTRODUODENOSCOPY (EGD);  Surgeon: Winfield Cunas., MD;  Location: [name];  Service: Endoscopy;  Laterality: N/A;  with APC on stand-by   ESOPHAGOGASTRODUODENOSCOPY N/A 07/06/2014   Procedure: ESOPHAGOGASTRODUODENOSCOPY (EGD);  Surgeon: Clarene Essex, MD;  Location: [name];  Service: Endoscopy;  Laterality: N/A;   ESOPHAGOGASTRODUODENOSCOPY N/A 09/05/2014   Procedure: ESOPHAGOGASTRODUODENOSCOPY (EGD);  Surgeon: Laurence Spates, MD;  Location: [name];  Service: Endoscopy;  Laterality: N/A;  APC on standby to control bleeding   ESOPHAGOGASTRODUODENOSCOPY N/A 11/29/2014   Procedure: ESOPHAGOGASTRODUODENOSCOPY (EGD);  Surgeon: [name], MD;  [facility] END[facility]COPY;  Service: Endoscopy;  Laterality: N/A;   ESOPHAGOGASTRODUODENOSCOPY N/A 09/28/2015   Procedure: ESOPHAGOGASTRODUODENOSCOPY (EGD);  Surgeon: Clarene Essex, MD;  Location[facility] ENDOSCOPY;  Service: Endoscopy;  Laterality: N/A;   ESOPHAGOGASTRODUODENOSCOPY (EGD) WITH PROPOFOL N/A 12/04/2017   Procedure: ESOPHAGOGASTRODUODENOSCOPY (EGD) WITH PROPOFOL;  Surgeon: [name], MD;  Location: [place] [facility];  Service: Endoscopy;  Laterality: N/A;   ESOPHAGOGASTRODUODENOSCOPY ENDOSCOPY  08/19/2006   with laser treatment   HOT HEMOSTASIS N/A 07/06/2014   Procedure: HOT HEMOSTASIS (ARGON PLASMA COAGULATION/BICAP);  Surgeon: Clarene Essex, MD;  Location: [name];  Service: Endoscopy;  Laterality: N/A;   HOT HEMOSTASIS N/A 09/28/2015   Procedure: HOT HEMOSTASIS (ARGON PLASMA COAGULATION/BICAP);  Surgeon: Clarene Essex, MD;  [facility] ENDOSCOPY;  Service: Endoscopy;  Laterality: N/A;   HOT HEMOSTASIS N/A 12/04/2017   Procedure: HOT HEMOSTASIS (ARGON PLASMA COAGULATION/BICAP);  Surgeon: [name], MD;  Location: [place];  Service: Endoscopy;  Laterality:  N/A;   NASAL HEMORRHAGE CONTROL     "for bleeding"    SAVORY DILATION  02/26/2011   Procedure: SAVORY DILATION;  Surgeon: [name], MD;  Location: [name];  Service: Endoscopy;  Laterality: N/A;  c-arm needed   SUBMUCOSAL INJECTION  12/04/2017   Procedure: SUBMUCOSAL INJECTION;  Surgeon: [name], MD;  Location: [facility] ENDOSCOPY;  Service: Endoscopy;;    Family History  Problem Relation Age of Onset   Stroke Father    Healthy Mother    Breast cancer Other    Diabetes Niece    Malignant hyperthermia Neg Hx    Seizures Neg Hx     Social History:  reports that she quit smoking about 49 years ago. Her smoking use included cigarettes. She has a 20.00 pack-year smoking history. She has never used smokeless tobacco. She reports that she does not drink alcohol and does not use drugs.   Review of Systems   Lipid history: Not on a statin drug    Lab Results  Component Value Date   CHOL 133 01/30/2019   HDL 41.30 01/30/2019   LDLCALC 57 01/30/2019   TRIG 176.0 (H) 01/30/2019   CHOLHDL 3 01/30/2019           Hypertension: Controlled on amlodipine  BP Readings from Last 3 Encounters:  08/28/20 130/64  08/13/20 (!) 136/45  06/24/20 122/72   Renal function history: Has CKD of unclear etiology  She is on Demadex prescribed by PCP   Lab Results  Component Value Date   CREATININE 2.14 (H) 08/06/2020   CREATININE 2.00 (H) 06/06/2020   CREATININE 1.75 (H) 04/11/2020   Lab Results  Component Value Date   K 4.0 08/06/2020     Most recent eye exam was in 07/2018  Most recent foot exam: 06/2018 by podiatrist with no evidence of sensory loss or reduction in sensation   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, microalbuminuria     Physical Examination:  BP 130/64   Pulse (!) 52   Ht 5\' 3"  (1.6 m)   Wt 149 lb (67.6 kg)   LMP  (LMP Unknown)   SpO2 94%   BMI 26.39 kg/m   No edema of lower legs present  ASSESSMENT:  Diabetes type 2 on  insulin  See history of present illness for detailed discussion of current diabetes management, blood sugar patterns and problems identified  A1c is 6.7, previously 7.1  She is currently on Trulicity 1.5 mg, Humalog 6 units at dinner and Humalog mix 12 units in the morning Her A1c is possibly inaccurate because of anemia  Blood sugars are again quite erratic and periodically higher postprandially However appears to have relatively better blood sugars in the last few days including a low sugar in the early afternoon yesterday She thinks she is taking her insulin at mealtime as directed at breakfast and dinner Not clear if her blood sugars may sometimes go up with eating lunch which is sometimes a hamburger As before likely that she is capable of using freestyle libre  Weight is increasing with better appetite   Hypertension: Blood pressure is normal with amlodipine now   Worsening renal function: Nephrology consultation  PLAN:    She needs to take only 4 units of Humalog at dinnertime is planning to eat a smaller meal with less carbohydrates Also in the morning as a precaution we will reduce her Humalog mix to 10 units instead of 12 To call if having consistently high or low blood sugars Stay on Trulicity but consider increasing the dose if she has excessive weight gain  She will take the insulin doses even when Premeal blood sugar is normal  Consideration  for using Jardiance or Carrington Clamp for renal dysfunction, likely cost may be an issue for her Will defer to nephrologist  Follow-up in 3 months  Patient Instructions  At supper if eating low starchy meal cut Humalog to 4 units  Reduce Humalog Mix, cloudy to 10 U in am    Total visit time including counseling = 30 minutes     Elayne Snare 08/29/2020, 9:12 AM   Note: This office note was prepared with Dragon voice recognition system technology. Any transcriptional errors that result from this process are unintentional.

## 2020-08-29 ENCOUNTER — Encounter: Payer: Self-pay | Admitting: Endocrinology

## 2020-09-03 NOTE — Progress Notes (Signed)
[name]   [phone] [phone]   Clinic Follow up Note   Patient Care Team: [name], Charlsie Quest, DO as PCP - General (Internal Medicine) [name], MD as PCP - Cardiology (Cardiology) Truitt Merle, MD as Consulting Physician (Hematology) Elayne Snare, MD as Consulting Physician (Endocrinology)  Date of Service:  09/04/2020  CHIEF COMPLAINT: f/u of Anemia due to chronic blood loss, HHT  HEMATOLOGY HISTORY 1. Hereditary hemorrhagic telangiectasia (HHT), with epistaxis and frequent GI bleeding. Previously under care of Dr. [name] 2. Anemia secondary to GI bleeding and iron deficiency, required blood transfusion every 3 to 4 weeks, IV Feraheme every 1 to 2 months  CURRENT THERAPY:   1. Iv feraheme as needed if ferritin<100, latest on 12/2019 2. Avastin 5mg /kg every 2 weeks, starting 05/09/18. Reduced to every 4 weeks starting 11/07/18. Reduced to every 2 months starting 01/02/19. Held 03/09/19-05/26/19 due to high proteinuria. Give Avastin as needed 3. Blood transfusion if Hg<8.0  INTERVAL HISTORY:  [place] is here for a follow up of anemia and HHT.  She is here alone, concerned about her leg edema, which is chronic She also complains of abdominal pain, does not appear comfortable. I called her niece who brought her in, she said pt was fine at home, she did not eat before she came in. We checked her BG which was low at 48, we gave her juice and snacks   MEDICAL HISTORY:  Past Medical History:  Diagnosis Date   Chronic anemia    Chronic diastolic CHF (congestive heart failure) ([place]) 10/03/2013   Chronic GI bleeding    /notes 11/29/2014   Family history of anesthesia complication    "niece has a hard time coming out" (09/15/2012)   Frequent nosebleeds    chronic   Gastric AV malformation    /notes 11/29/2014   GERD (gastroesophageal reflux disease)    Heart murmur 04/01/2017   Moderate AVSC on echo 09/2016   History of blood transfusion  "several"   History of epistaxis    HTN (hypertension), benign 03/02/2012   Hyperlipidemia    Iron deficiency anemia    chronic infusions"   Lichen planus    Both lower extremities   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    SURGICAL HISTORY: Past Surgical History:  Procedure Laterality Date   CATARACT EXTRACTION     "I think it was just one eye"   ESOPHAGOGASTRODUODENOSCOPY  02/26/2011   Procedure: ESOPHAGOGASTRODUODENOSCOPY (EGD);  Surgeon: [name], MD;  Location: [name];  Service: Endoscopy;  Laterality: N/A;   ESOPHAGOGASTRODUODENOSCOPY N/A 11/08/2012   Procedure: ESOPHAGOGASTRODUODENOSCOPY (EGD);  Surgeon: [name], MD;  Location: [name];  Service: Endoscopy;  Laterality: N/A;   ESOPHAGOGASTRODUODENOSCOPY N/A 10/04/2013   Procedure: ESOPHAGOGASTRODUODENOSCOPY (EGD);  Surgeon: Winfield Cunas., MD;  Location: [name];  Service: Endoscopy;  Laterality: N/A;  with APC on stand-by   ESOPHAGOGASTRODUODENOSCOPY N/A 07/06/2014   Procedure: ESOPHAGOGASTRODUODENOSCOPY (EGD);  Surgeon: Clarene Essex, MD;  Location: [name];  Service: Endoscopy;  Laterality: N/A;   ESOPHAGOGASTRODUODENOSCOPY N/A 09/05/2014   Procedure: ESOPHAGOGASTRODUODENOSCOPY (EGD);  Surgeon: Laurence Spates,  MD;  Location: [name];  Service: Endoscopy;  Laterality: N/A;  APC on standby to control bleeding   ESOPHAGOGASTRODUODENOSCOPY N/A 11/29/2014   Procedure: ESOPHAGOGASTRODUODENOSCOPY (EGD);  Surgeon: [name], MD;  Location: [facility] ENDOSCOPY;  Service: Endoscopy;  Laterality: N/A;   ESOPHAGOGASTRODUODENOSCOPY N/A 09/28/2015   Procedure: ESOPHAGOGASTRODUODENOSCOPY (EGD);  Surgeon: Clarene Essex, MD;  Location[facility];  Service: Endoscopy;  Laterality: N/A;   ESOPHAGOGASTRODUODENOSCOPY (EGD) WITH PROPOFOL N/A 12/04/2017   Procedure: ESOPHAGOGASTRODUODENOSCOPY (EGD) WITH PROPOFOL;  Surgeon: [name], MD;  Location: [facility];  Service: Endoscopy;  Laterality: N/A;   ESOPHAGOGASTRODUODENOSCOPY ENDOSCOPY  08/19/2006   with laser treatment   HOT HEMOSTASIS N/A 07/06/2014   Procedure: HOT HEMOSTASIS (ARGON PLASMA COAGULATION/BICAP);  Surgeon: Clarene Essex, MD;  Location: [name];  Service: Endoscopy;  Laterality: N/A;   HOT HEMOSTASIS N/A 09/28/2015   Procedure: HOT HEMOSTASIS (ARGON PLASMA COAGULATION/BICAP);  Surgeon: Clarene Essex, MD;  Location: [facility] ENDOSCOPY;  Service: Endoscopy;  Laterality: N/A;   HOT HEMOSTASIS N/A 12/04/2017   Procedure: HOT HEMOSTASIS (ARGON PLASMA COAGULATION/BICAP);  Surgeon: [name], MD;  Location: [facility];  Service: Endoscopy;  Laterality: N/A;   NASAL HEMORRHAGE CONTROL     "for bleeding"    SAVORY DILATION  02/26/2011   Procedure: SAVORY DILATION;  Surgeon: [name], MD;  Location: [name];  Service: Endoscopy;  Laterality: N/A;  c-arm needed   SUBMUCOSAL INJECTION  12/04/2017   Procedure: SUBMUCOSAL INJECTION;  Surgeon: [name], MD;  Location: [facility] ENDOSCOPY;  Service: Endoscopy;;    I have reviewed the social history and family history with the patient and they are unchanged from previous note.  ALLERGIES:  is allergic to aspirin.  MEDICATIONS:  Current Outpatient Medications  Medication Sig Dispense Refill   ACCU-CHEK GUIDE test strip USE AS INSTRUCTED TO CHECK BLOOD SUGAR 3 TIMES DAILY. 100 each 0   Accu-Chek Softclix Lancets lancets 1 each by Other route 3 (three) times daily. Use as instructed to check blood sugar 3 times per day dx code E11.65 100 each 3   amLODipine (NORVASC) 10 MG tablet Take 1 tablet (10 mg total) by mouth daily. 30 tablet 11   cetaphil (CETAPHIL) lotion Apply 1 application  topically as needed for dry skin. 236 mL 0   Continuous Blood Gluc Sensor (FREESTYLE LIBRE 2 SENSOR) MISC 2 Devices by Does not apply route every 14 (fourteen) days. 2 each 3   donepezil (ARICEPT) 10 MG tablet TAKE 1 TABLET BY MOUTH AT BEDTIME 30 tablet 1   Elastic Bandages & Supports (MEDICAL COMPRESSION STOCKINGS) MISC Wear as much as possible while awake to reduce swelling 2 each 0   Ferrous Fumarate (FERROCITE) 324 (106 Fe) MG TABS tablet Take 1 tablet (106 mg of iron total) by mouth every other day. 15 tablet 5   gabapentin (NEURONTIN) 100 MG capsule TAKE 1 CAPSULE BY MOUTH TWICE DAILY 60 capsule 10   HUMALOG MIX 75/25 KWIKPEN (75-25) 100 UNIT/ML Kwikpen INJECT 10 UNITS SUBCUTANEOUSLY ONCE DAILY 9 mL 10   insulin lispro (HUMALOG) 100 UNIT/ML KwikPen INJECT 3 UNITS SUBCUTANEOUSLY TWICE DAILY WITH LUNCH AND DINNER; IF HEAVY CARB BREAKFAST ALSO INJECT 3 UNITS 6 mL 5   Insulin Pen Needle (B-D UF III MINI PEN NEEDLES) 31G X 5 MM MISC USE TO INJECT INSULIN 3 TIMES A DAY 100 each 3   lactulose (CHRONULAC) 10 GM/15ML solution TAKE 15 ML BY MOUTH TWO TIMES DAILY 473 mL 2   levETIRAcetam (KEPPRA) 500 MG tablet TAKE  1 TABLET BY MOUTH TWICE DAILY 60 tablet 10   memantine (NAMENDA) 10 MG tablet Take 1 tablet (10 mg total) by mouth 2 (two) times daily. 60 tablet 11   oxymetazoline (AFRIN) 0.05 % nasal spray Place 1 spray into both nostrils 2 (two) times daily as needed (Epistaxis). 30 mL 0   pantoprazole (PROTONIX) 40 MG tablet Take 1 tablet by mouth once daily 180 tablet 0   Potassium Chloride ER 20 MEQ TBCR Take 20 mEq by mouth 2 (two) times daily. 60 tablet 5   potassium chloride SA (KLOR-CON) 20 MEQ tablet TAKE 1 TABLET BY MOUTH TWICE DAILY 60 tablet 1   torsemide (DEMADEX) 20 MG tablet Take 1 tablet (20 mg total) by mouth daily. 30 tablet 11   TRESIBA FLEXTOUCH 100 UNIT/ML FlexTouch Pen INJECT 10 UNITS SUBCUTANEOUSLY ONCE DAILY 12 mL 10   triamcinolone ointment (KENALOG) 0.5 % APPLY 1 APPLICATION  TOPICALLY TO RASH TWICE DAILY FOR ITCHING 30 g 10   TRULICITY 1.5 HG/9.9ME SOPN INJECT 1.5MG  SUBCUTANEOUSLY EVERY WEEK ON THURSDAY 4 mL 10   No current facility-administered medications for this visit.   Facility-Administered Medications Ordered in Other Visits  Medication Dose Route Frequency Provider Last Rate Last Admin   sodium chloride 0.9 % injection 10 mL  10 mL Intracatheter PRN Truitt Merle, MD       sodium chloride flush (NS) 0.9 % injection 10 mL  10 mL Intracatheter PRN Truitt Merle, MD   10 mL at 08/18/19 1717    PHYSICAL EXAMINATION: ECOG PERFORMANCE STATUS: 2 - Symptomatic, <50% confined to bed  Vitals:   09/04/20 1104  BP: (!) 128/38  Pulse: 63  Resp: 17  Temp: 98.7 F (37.1 C)  SpO2: 100%    Filed Weights   09/04/20 1104  Weight: 151 lb 6.4 oz (68.7 kg)     GENERAL:alert, in wheelchair, moderate distress, not able to sit still SKIN: skin color, texture, turgor are normal, no rashes or significant lesions EYES: normal, Conjunctiva are pink and non-injected, sclera clear  NECK: supple, thyroid normal size, non-tender, without nodularity LYMPH:  no palpable lymphadenopathy in the cervical, axillary  LUNGS: clear to auscultation and percussion with normal breathing effort HEART: regular rate & rhythm and no murmurs; mild lower extremity edema present, tenderness ABDOMEN:abdomen soft, non-tender and normal bowel sounds Musculoskeletal:no cyanosis of digits and no clubbing  NEURO: alert & oriented x 3 with fluent speech, no focal motor/sensory deficits  LABORATORY DATA:  I have reviewed the data as listed CBC Latest Ref Rng & Units 09/04/2020 08/06/2020 07/09/2020  WBC 4.0 - 10.5 K/uL 7.4 5.4 5.6  Hemoglobin 12.0 - 15.0 g/dL 12.3 10.8(L) 12.1  Hematocrit 36.0 - 46.0 % 36.4 32.4(L) 36.7  Platelets 150 - 400 K/uL 150 139(L) 151     CMP Latest Ref Rng & Units 08/06/2020 06/06/2020 04/11/2020  Glucose 70 - 99 mg/dL 478(H) 259(H) 227(H)  BUN 8 - 23 mg/dL 30(H) 30(H)  35(H)  Creatinine 0.44 - 1.00 mg/dL 2.14(H) 2.00(H) 1.75(H)  Sodium 135 - 145 mmol/L 144 140 139  Potassium 3.5 - 5.1 mmol/L 4.0 5.2(H) 4.3  Chloride 98 - 111 mmol/L 105 104 103  CO2 22 - 32 mmol/L 29 27 28   Calcium 8.9 - 10.3 mg/dL 9.5 9.5 9.2  Total Protein 6.5 - 8.1 g/dL 6.7 6.6 6.6  Total Bilirubin 0.3 - 1.2 mg/dL 0.6 0.5 0.4  Alkaline Phos 38 - 126 U/L 246(H) 256(H) 216(H)  AST 15 - 41 U/L 38  67(H) 36  ALT 0 - 44 U/L 29 89(H) 32      RADIOGRAPHIC STUDIES: I have personally reviewed the radiological images as listed and agreed with the findings in the report. No results found.   ASSESSMENT & PLAN:  [name] is a 80 y.o. female with   1. Hereditary hemorrhagic telangiectasia (HHT)), with epistaxis and frequent GI bleeding -She receives blood transfusion as needed, but has not needed since 04/2018 when she started Beva. -She is on Avastin intermittently/PRN due to high proteinuria. -anemia has most resolved  -she developed anemia again one month ago without Beva for 2 months. Anemia resolved today after one dose beva two weeks ago   2. Iron deficiency anemia secondary to #1 -She receives IV Feraheme as needed for Ferritin <100, most recently 08/13/20  3. Dementia  -She takes Aricept and memantine. On Keppra for seizures.  -f/u with neurology.   5. DM, CHF, CKD stage III and HTN secondary to Avastin - followed by Dr. Dwyane Dee -She has some mild lower extremity swelling today. She is not aware of this being present before, nor is her husband. -I recommend they monitor this  6. Proteinuria -This is likely secondary to avastin, but she also has diabetes  -She received bevacizumab as needed   Plan -lab reviewed, will proceed with Bevacizumab today, no need iv iron -lab monthly, iv feraheme if ferritin<100 -lab and beva in 6 and 12 weeks -f/u in 12 weeks -I spoke with her niece today. -Patient needs family member to come in with her in future, due to her  dementia.   No problem-specific Assessment & Plan notes found for this encounter.   No orders of the defined types were placed in this encounter.  All questions were answered. The patient knows to call the clinic with any problems, questions or concerns. No barriers to learning was detected. The total time spent in the appointment was 30 minutes.     Truitt Merle, MD 09/04/2020

## 2020-09-04 ENCOUNTER — Encounter: Payer: Self-pay | Admitting: Hematology

## 2020-09-04 ENCOUNTER — Inpatient Hospital Stay: Payer: Medicare Other

## 2020-09-04 ENCOUNTER — Inpatient Hospital Stay (HOSPITAL_BASED_OUTPATIENT_CLINIC_OR_DEPARTMENT_OTHER): Payer: Medicare Other | Admitting: Hematology

## 2020-09-04 ENCOUNTER — Other Ambulatory Visit: Payer: Self-pay

## 2020-09-04 VITALS — BP 128/38 | HR 63 | Temp 98.7°F | Resp 17 | Ht 63.0 in | Wt 151.4 lb

## 2020-09-04 VITALS — BP 166/62 | HR 69 | Temp 97.9°F

## 2020-09-04 DIAGNOSIS — I780 Hereditary hemorrhagic telangiectasia: Secondary | ICD-10-CM

## 2020-09-04 DIAGNOSIS — Z95828 Presence of other vascular implants and grafts: Secondary | ICD-10-CM

## 2020-09-04 DIAGNOSIS — D500 Iron deficiency anemia secondary to blood loss (chronic): Secondary | ICD-10-CM

## 2020-09-04 DIAGNOSIS — K31819 Angiodysplasia of stomach and duodenum without bleeding: Secondary | ICD-10-CM

## 2020-09-04 DIAGNOSIS — Z5112 Encounter for antineoplastic immunotherapy: Secondary | ICD-10-CM | POA: Diagnosis not present

## 2020-09-04 LAB — CBC WITH DIFFERENTIAL (CANCER CENTER ONLY)
Abs Immature Granulocytes: 0.02 10*3/uL (ref 0.00–0.07)
Basophils Absolute: 0.1 10*3/uL (ref 0.0–0.1)
Basophils Relative: 1 %
Eosinophils Absolute: 0.4 10*3/uL (ref 0.0–0.5)
Eosinophils Relative: 6 %
HCT: 36.4 % (ref 36.0–46.0)
Hemoglobin: 12.3 g/dL (ref 12.0–15.0)
Immature Granulocytes: 0 %
Lymphocytes Relative: 35 %
Lymphs Abs: 2.6 10*3/uL (ref 0.7–4.0)
MCH: 30.1 pg (ref 26.0–34.0)
MCHC: 33.8 g/dL (ref 30.0–36.0)
MCV: 89.2 fL (ref 80.0–100.0)
Monocytes Absolute: 0.8 10*3/uL (ref 0.1–1.0)
Monocytes Relative: 11 %
Neutro Abs: 3.5 10*3/uL (ref 1.7–7.7)
Neutrophils Relative %: 47 %
Platelet Count: 150 10*3/uL (ref 150–400)
RBC: 4.08 MIL/uL (ref 3.87–5.11)
RDW: 14.2 % (ref 11.5–15.5)
WBC Count: 7.4 10*3/uL (ref 4.0–10.5)
nRBC: 0 % (ref 0.0–0.2)

## 2020-09-04 LAB — FERRITIN: Ferritin: 151 ng/mL (ref 11–307)

## 2020-09-04 LAB — TOTAL PROTEIN, URINE DIPSTICK: Protein, ur: 30 mg/dL — AB

## 2020-09-04 MED ORDER — SODIUM CHLORIDE 0.9% FLUSH
10.0000 mL | INTRAVENOUS | Status: DC | PRN
  Administered 2020-09-04: 10 mL
  Filled 2020-09-04: qty 10

## 2020-09-04 MED ORDER — HEPARIN SOD (PORK) LOCK FLUSH 100 UNIT/ML IV SOLN
500.0000 [IU] | Freq: Once | INTRAVENOUS | Status: AC | PRN
  Administered 2020-09-04: 500 [IU]
  Filled 2020-09-04: qty 5

## 2020-09-04 MED ORDER — SODIUM CHLORIDE 0.9 % IV SOLN
Freq: Once | INTRAVENOUS | Status: AC
  Filled 2020-09-04: qty 250

## 2020-09-04 MED ORDER — SODIUM CHLORIDE 0.9 % IV SOLN
4.6000 mg/kg | Freq: Once | INTRAVENOUS | Status: AC
  Administered 2020-09-04: 300 mg via INTRAVENOUS
  Filled 2020-09-04: qty 12

## 2020-09-04 NOTE — Progress Notes (Signed)
Per Dr. Burr Medico ok to treat with urine protein of 30.  Ok to proceed with bevacizumab infusion despite hypertensive readings per Dr. Burr Medico.  Patient became agitated & upset in infusion, niece called to sit with patient during infusion.

## 2020-09-04 NOTE — Patient Instructions (Signed)
[facility]  Discharge Instructions: Thank you for choosing [facility] to provide your oncology and hematology care.   If you have a lab appointment with the [facility], please go directly to the [facility] and check in at the registration area.   Wear comfortable clothing and clothing appropriate for easy access to any Portacath or PICC line.   We strive to give you quality time with your provider. You may need to reschedule your appointment if you arrive late (15 or more minutes).  Arriving late affects you and other patients whose appointments are after yours.  Also, if you miss three or more appointments without notifying the office, you may be dismissed from the clinic at the provider's discretion.      For prescription refill requests, have your pharmacy contact our office and allow 72 hours for refills to be completed.    Today you received the following chemotherapy and/or immunotherapy agents Mvasi      To help prevent nausea and vomiting after your treatment, we encourage you to take your nausea medication as directed.  BELOW ARE SYMPTOMS THAT SHOULD BE REPORTED IMMEDIATELY: *FEVER GREATER THAN 100.4 F (38 C) OR HIGHER *CHILLS OR SWEATING *NAUSEA AND VOMITING THAT IS NOT CONTROLLED WITH YOUR NAUSEA MEDICATION *UNUSUAL SHORTNESS OF BREATH *UNUSUAL BRUISING OR BLEEDING *URINARY PROBLEMS (pain or burning when urinating, or frequent urination) *BOWEL PROBLEMS (unusual diarrhea, constipation, pain near the anus) TENDERNESS IN MOUTH AND THROAT WITH OR WITHOUT PRESENCE OF ULCERS (sore throat, sores in mouth, or a toothache) UNUSUAL RASH, SWELLING OR PAIN  UNUSUAL VAGINAL DISCHARGE OR ITCHING   Items with * indicate a potential emergency and should be followed up as soon as possible or go to the Emergency Department if any problems should occur.  Please show the CHEMOTHERAPY ALERT CARD or IMMUNOTHERAPY ALERT CARD at check-in to the  Emergency Department and triage nurse.  Should you have questions after your visit or need to cancel or reschedule your appointment, please contact [facility]  Dept: [phone]  and follow the prompts.  Office hours are 8:00 a.m. to 4:30 p.m. Monday - Friday. Please note that voicemails left after 4:00 p.m. may not be returned until the following business day.  We are closed weekends and major holidays. You have access to a nurse at all times for urgent questions. Please call the main number to the clinic Dept: [phone] and follow the prompts.   For any non-urgent questions, you may also contact your provider using MyChart. We now offer e-Visits for anyone 50 and older to request care online for non-urgent symptoms. For details visit [email or web address].   Also download the MyChart app! Go to the app store, search "MyChart", open the app, select [facility], and log in with your MyChart username and password.  Due to Covid, a mask is required upon entering the hospital/clinic. If you do not have a mask, one will be given to you upon arrival. For doctor visits, patients may have 1 support person aged 49 or older with them. For treatment visits, patients cannot have anyone with them due to current Covid guidelines and our immunocompromised population.   Bevacizumab injection What is this medication? BEVACIZUMAB (be va SIZ yoo mab) is a monoclonal antibody. It is used to treatmany types of cancer. This medicine may be used for other purposes; ask your health care provider orpharmacist if you have questions. COMMON BRAND NAME(S): Avastin, MVASI, Zirabev What  should I tell my care team before I take this medication? They need to know if you have any of these conditions: diabetes heart disease high blood pressure history of coughing up blood prior anthracycline chemotherapy (e.g., doxorubicin, daunorubicin, epirubicin) recent or ongoing radiation  therapy recent or planning to have surgery stroke an unusual or allergic reaction to bevacizumab, hamster proteins, mouse proteins, other medicines, foods, dyes, or preservatives pregnant or trying to get pregnant breast-feeding How should I use this medication? This medicine is for infusion into a vein. It is given by a health careprofessional in a hospital or clinic setting. Talk to your pediatrician regarding the use of this medicine in children.Special care may be needed. Overdosage: If you think you have taken too much of this medicine contact apoison control center or emergency room at once. NOTE: This medicine is only for you. Do not share this medicine with others. What if I miss a dose? It is important not to miss your dose. Call your doctor or health careprofessional if you are unable to keep an appointment. What may interact with this medication? Interactions are not expected. This list may not describe all possible interactions. Give your health care provider a list of all the medicines, herbs, non-prescription drugs, or dietary supplements you use. Also tell them if you smoke, drink alcohol, or use illegaldrugs. Some items may interact with your medicine. What should I watch for while using this medication? Your condition will be monitored carefully while you are receiving this medicine. You will need important blood work and urine testing done while youare taking this medicine. This medicine may increase your risk to bruise or bleed. Call your doctor orhealth care professional if you notice any unusual bleeding. Before having surgery, talk to your health care provider to make sure it is ok. This drug can increase the risk of poor healing of your surgical site or wound. You will need to stop this drug for 28 days before surgery. After surgery, wait at least 28 days before restarting this drug. Make sure the surgical site or wound is healed enough before restarting this drug. Talk to  your health careprovider if questions. Do not become pregnant while taking this medicine or for 6 months after stopping it. Women should inform their doctor if they wish to become pregnant or think they might be pregnant. There is a potential for serious side effects to an unborn child. Talk to your health care professional or pharmacist for more information. Do not breast-feed an infant while taking this medicine andfor 6 months after the last dose. This medicine has caused ovarian failure in some women. This medicine may interfere with the ability to have a child. You should talk to your doctor orhealth care professional if you are concerned about your fertility. What side effects may I notice from receiving this medication? Side effects that you should report to your doctor or health care professionalas soon as possible: allergic reactions like skin rash, itching or hives, swelling of the face, lips, or tongue chest pain or chest tightness chills coughing up blood high fever seizures severe constipation signs and symptoms of bleeding such as bloody or black, tarry stools; red or dark-brown urine; spitting up blood or brown material that looks like coffee grounds; red spots on the skin; unusual bruising or bleeding from the eye, gums, or nose signs and symptoms of a blood clot such as breathing problems; chest pain; severe, sudden headache; pain, swelling, warmth in the leg signs and symptoms  of a stroke like changes in vision; confusion; trouble speaking or understanding; severe headaches; sudden numbness or weakness of the face, arm or leg; trouble walking; dizziness; loss of balance or coordination stomach pain sweating swelling of legs or ankles vomiting weight gain Side effects that usually do not require medical attention (report to yourdoctor or health care professional if they continue or are bothersome): back pain changes in taste decreased appetite dry  skin nausea tiredness This list may not describe all possible side effects. Call your doctor for medical advice about side effects. You may report side effects to FDA at1-800-FDA-1088. Where should I keep my medication? This drug is given in a hospital or clinic and will not be stored at home. NOTE: This sheet is a summary. It may not cover all possible information. If you have questions about this medicine, talk to your doctor, pharmacist, orhealth care provider.  2022 Elsevier/Gold Standard (2018-11-23 10:50:46)

## 2020-09-05 ENCOUNTER — Telehealth: Payer: Self-pay | Admitting: Hematology

## 2020-09-05 NOTE — Telephone Encounter (Signed)
Scheduled follow-up appointments per 7/27 los. Patient is aware. 

## 2020-09-06 ENCOUNTER — Other Ambulatory Visit: Payer: Self-pay | Admitting: Internal Medicine

## 2020-09-06 DIAGNOSIS — R413 Other amnesia: Secondary | ICD-10-CM

## 2020-09-16 ENCOUNTER — Encounter: Payer: Self-pay | Admitting: Podiatry

## 2020-09-16 ENCOUNTER — Other Ambulatory Visit: Payer: Self-pay

## 2020-09-16 ENCOUNTER — Ambulatory Visit (INDEPENDENT_AMBULATORY_CARE_PROVIDER_SITE_OTHER): Payer: Medicare Other | Admitting: Podiatry

## 2020-09-16 DIAGNOSIS — E1151 Type 2 diabetes mellitus with diabetic peripheral angiopathy without gangrene: Secondary | ICD-10-CM

## 2020-09-16 DIAGNOSIS — M79675 Pain in left toe(s): Secondary | ICD-10-CM | POA: Diagnosis not present

## 2020-09-16 DIAGNOSIS — M79674 Pain in right toe(s): Secondary | ICD-10-CM | POA: Diagnosis not present

## 2020-09-16 DIAGNOSIS — B351 Tinea unguium: Secondary | ICD-10-CM | POA: Diagnosis not present

## 2020-09-21 NOTE — Progress Notes (Signed)
  Subjective:  Patient ID: [name], female    DOB: Apr 23, 1940,  MRN: [id number]  [place] presents to clinic today for at risk foot care. Pt has h/o NIDDM with PAD and painful thick toenails that are difficult to trim. Pain interferes with ambulation. Aggravating factors include wearing enclosed shoe gear. Pain is relieved with periodic professional debridement.  She states her blood glucose was 96 mg/dl today.   Patient states she attempted to cut her toenails.  Per chart review, she is followed by [facility] and last ABIs were in June, 2021. 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 Recommended follow up if she develops rest pain or nonhealing wound.  PCP is [name], Areeg N, DO , and last visit was 05/29/2020.  Allergies  Allergen Reactions   Aspirin Nausea And Vomiting    Other reaction(s): Other (See Comments)    Review of Systems: Negative except as noted in the HPI. Objective:   Constitutional Afiya Ferrebee Eunice is a pleasant 80 y.o. African American female, in NAD. AAO x 3.   Vascular Capillary fill time to digits <3 seconds b/l lower extremities. Nonpalpable DP pulse(s) b/l lower extremities. Nonpalpable PT pulse(s) b/l lower extremities. Pedal hair absent. Lower extremity skin temperature gradient within normal limits. No pain with calf compression b/l. Trace edema noted b/l lower extremities. No ischemia or gangrene noted b/l lower extremities. No cyanosis or clubbing noted.  Neurologic Normal speech. Oriented to person, place, and time. Protective sensation intact 5/5 intact bilaterally with 10g monofilament b/l. Vibratory sensation intact b/l.  Dermatologic Pedal skin with normal turgor, texture and tone b/l lower extremities. No open wounds b/l lower extremities. No interdigital macerations b/l lower extremities. Toenails 1-5 b/l elongated, discolored, dystrophic, thickened, crumbly with subungual debris and tenderness to dorsal  palpation. No hyperkeratotic nor porokeratotic lesions present on today's visit. Healed laceration distal tip of right 5th digit from patient's trimming. No erythema, no edema, no drainage, no fluctuance, no ischemia/gangrene. Digit is healed.  Orthopedic: Normal muscle strength 5/5 to all lower extremity muscle groups bilaterally. No pain crepitus or joint limitation noted with ROM b/l lower extremities. Hallux valgus with bunion deformity noted b/l lower extremities. Hammertoe(s) noted to the R 5th toe.   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 Pain due to onychomycosis of toenails of both feet   2. Type II diabetes mellitus with peripheral circulatory disorder ([name]    Plan:  -Examined patient. -Educated patient on the dangers of using sharp instruments on her feet in setting of diabetes and peripheral vascular disease. She related understanding. -Continue diabetic foot care principles. -Patient to continue soft, supportive shoe gear daily. -Toenails 1-5 b/l were debrided in length and girth with sterile nail nippers and dremel without iatrogenic bleeding.  -Patient to report any pedal injuries to medical professional immediately. -Patient/POA to call should there be question/concern in the interim.  Return in about 3 months (around 12/17/2020).  Marzetta Board, DPM

## 2020-09-29 ENCOUNTER — Other Ambulatory Visit: Payer: Self-pay

## 2020-09-29 ENCOUNTER — Emergency Department (HOSPITAL_COMMUNITY): Payer: Medicare Other

## 2020-09-29 ENCOUNTER — Observation Stay (HOSPITAL_COMMUNITY)
Admission: EM | Admit: 2020-09-29 | Discharge: 2020-09-30 | Disposition: A | Discharge status: Home / Self Care | Payer: Medicare Other | Attending: Family Medicine | Admitting: Family Medicine

## 2020-09-29 ENCOUNTER — Observation Stay (HOSPITAL_BASED_OUTPATIENT_CLINIC_OR_DEPARTMENT_OTHER): Payer: Medicare Other

## 2020-09-29 ENCOUNTER — Encounter (HOSPITAL_COMMUNITY): Payer: Self-pay | Admitting: Internal Medicine

## 2020-09-29 DIAGNOSIS — Z79899 Other long term (current) drug therapy: Secondary | ICD-10-CM | POA: Insufficient documentation

## 2020-09-29 DIAGNOSIS — I780 Hereditary hemorrhagic telangiectasia: Secondary | ICD-10-CM | POA: Diagnosis present

## 2020-09-29 DIAGNOSIS — Z20822 Contact with and (suspected) exposure to covid-19: Secondary | ICD-10-CM | POA: Diagnosis not present

## 2020-09-29 DIAGNOSIS — E11649 Type 2 diabetes mellitus with hypoglycemia without coma: Secondary | ICD-10-CM | POA: Insufficient documentation

## 2020-09-29 DIAGNOSIS — I5033 Acute on chronic diastolic (congestive) heart failure: Principal | ICD-10-CM | POA: Diagnosis present

## 2020-09-29 DIAGNOSIS — Z87891 Personal history of nicotine dependence: Secondary | ICD-10-CM | POA: Insufficient documentation

## 2020-09-29 DIAGNOSIS — F0390 Unspecified dementia without behavioral disturbance: Secondary | ICD-10-CM | POA: Diagnosis not present

## 2020-09-29 DIAGNOSIS — N1831 Chronic kidney disease, stage 3a: Secondary | ICD-10-CM | POA: Diagnosis not present

## 2020-09-29 DIAGNOSIS — E876 Hypokalemia: Secondary | ICD-10-CM | POA: Insufficient documentation

## 2020-09-29 DIAGNOSIS — R2243 Localized swelling, mass and lump, lower limb, bilateral: Secondary | ICD-10-CM | POA: Diagnosis present

## 2020-09-29 DIAGNOSIS — I130 Hypertensive heart and chronic kidney disease with heart failure and stage 1 through stage 4 chronic kidney disease, or unspecified chronic kidney disease: Secondary | ICD-10-CM | POA: Diagnosis not present

## 2020-09-29 DIAGNOSIS — I10 Essential (primary) hypertension: Secondary | ICD-10-CM | POA: Diagnosis present

## 2020-09-29 DIAGNOSIS — E162 Hypoglycemia, unspecified: Secondary | ICD-10-CM

## 2020-09-29 DIAGNOSIS — R600 Localized edema: Secondary | ICD-10-CM | POA: Diagnosis not present

## 2020-09-29 DIAGNOSIS — Z794 Long term (current) use of insulin: Secondary | ICD-10-CM | POA: Diagnosis not present

## 2020-09-29 DIAGNOSIS — I517 Cardiomegaly: Secondary | ICD-10-CM

## 2020-09-29 DIAGNOSIS — N183 Chronic kidney disease, stage 3 unspecified: Secondary | ICD-10-CM | POA: Diagnosis present

## 2020-09-29 DIAGNOSIS — R609 Edema, unspecified: Secondary | ICD-10-CM

## 2020-09-29 LAB — GLUCOSE, CAPILLARY
Glucose-Capillary: 134 mg/dL — ABNORMAL HIGH (ref 70–99)
Glucose-Capillary: 174 mg/dL — ABNORMAL HIGH (ref 70–99)
Glucose-Capillary: 188 mg/dL — ABNORMAL HIGH (ref 70–99)
Glucose-Capillary: 191 mg/dL — ABNORMAL HIGH (ref 70–99)
Glucose-Capillary: 223 mg/dL — ABNORMAL HIGH (ref 70–99)

## 2020-09-29 LAB — CBC WITH DIFFERENTIAL/PLATELET
Abs Immature Granulocytes: 0.08 10*3/uL — ABNORMAL HIGH (ref 0.00–0.07)
Basophils Absolute: 0 10*3/uL (ref 0.0–0.1)
Basophils Relative: 1 %
Eosinophils Absolute: 0.5 10*3/uL (ref 0.0–0.5)
Eosinophils Relative: 5 %
HCT: 31.8 % — ABNORMAL LOW (ref 36.0–46.0)
Hemoglobin: 10.4 g/dL — ABNORMAL LOW (ref 12.0–15.0)
Immature Granulocytes: 1 %
Lymphocytes Relative: 16 %
Lymphs Abs: 1.3 10*3/uL (ref 0.7–4.0)
MCH: 29.1 pg (ref 26.0–34.0)
MCHC: 32.7 g/dL (ref 30.0–36.0)
MCV: 88.8 fL (ref 80.0–100.0)
Monocytes Absolute: 0.9 10*3/uL (ref 0.1–1.0)
Monocytes Relative: 10 %
Neutro Abs: 5.8 10*3/uL (ref 1.7–7.7)
Neutrophils Relative %: 67 %
Platelets: 199 10*3/uL (ref 150–400)
RBC: 3.58 MIL/uL — ABNORMAL LOW (ref 3.87–5.11)
RDW: 14.6 % (ref 11.5–15.5)
WBC: 8.6 10*3/uL (ref 4.0–10.5)
nRBC: 0 % (ref 0.0–0.2)

## 2020-09-29 LAB — ECHOCARDIOGRAM COMPLETE
AR max vel: 1.25 cm2
AV Area VTI: 1.18 cm2
AV Area mean vel: 1.31 cm2
AV Mean grad: 13.6 mmHg
AV Peak grad: 28.6 mmHg
Ao pk vel: 2.67 m/s
Area-P 1/2: 3.54 cm2
S' Lateral: 2.9 cm
Weight: 2515.01 oz

## 2020-09-29 LAB — COMPREHENSIVE METABOLIC PANEL
ALT: 15 U/L (ref 0–44)
AST: 33 U/L (ref 15–41)
Albumin: 3.1 g/dL — ABNORMAL LOW (ref 3.5–5.0)
Alkaline Phosphatase: 151 U/L — ABNORMAL HIGH (ref 38–126)
Anion gap: 10 (ref 5–15)
BUN: 10 mg/dL (ref 8–23)
CO2: 29 mmol/L (ref 22–32)
Calcium: 8.9 mg/dL (ref 8.9–10.3)
Chloride: 102 mmol/L (ref 98–111)
Creatinine, Ser: 1.07 mg/dL — ABNORMAL HIGH (ref 0.44–1.00)
GFR, Estimated: 53 mL/min — ABNORMAL LOW (ref >60)
Glucose, Bld: 41 mg/dL — CL (ref 70–99)
Potassium: 2.6 mmol/L — CL (ref 3.5–5.1)
Sodium: 141 mmol/L (ref 135–145)
Total Bilirubin: 1 mg/dL (ref 0.3–1.2)
Total Protein: 6.1 g/dL — ABNORMAL LOW (ref 6.5–8.1)

## 2020-09-29 LAB — BASIC METABOLIC PANEL
Anion gap: 11 (ref 5–15)
BUN: 10 mg/dL (ref 8–23)
CO2: 30 mmol/L (ref 22–32)
Calcium: 9.1 mg/dL (ref 8.9–10.3)
Chloride: 99 mmol/L (ref 98–111)
Creatinine, Ser: 1.13 mg/dL — ABNORMAL HIGH (ref 0.44–1.00)
GFR, Estimated: 49 mL/min — ABNORMAL LOW (ref >60)
Glucose, Bld: 221 mg/dL — ABNORMAL HIGH (ref 70–99)
Potassium: 3.2 mmol/L — ABNORMAL LOW (ref 3.5–5.1)
Sodium: 140 mmol/L (ref 135–145)

## 2020-09-29 LAB — RESP PANEL BY RT-PCR (FLU A&B, COVID) ARPGX2
Influenza A by PCR: NEGATIVE
Influenza B by PCR: NEGATIVE
SARS Coronavirus 2 by RT PCR: NEGATIVE

## 2020-09-29 LAB — CBG MONITORING, ED
Glucose-Capillary: 43 mg/dL — CL (ref 70–99)
Glucose-Capillary: 192 mg/dL — ABNORMAL HIGH (ref 70–99)

## 2020-09-29 LAB — BRAIN NATRIURETIC PEPTIDE: B Natriuretic Peptide: 208.3 pg/mL — ABNORMAL HIGH (ref 0.0–100.0)

## 2020-09-29 LAB — MAGNESIUM: Magnesium: 1.7 mg/dL (ref 1.7–2.4)

## 2020-09-29 MED ORDER — DEXTROSE 50 % IV SOLN
1.0000 | Freq: Once | INTRAVENOUS | Status: AC
  Administered 2020-09-29: 50 mL via INTRAVENOUS
  Filled 2020-09-29: qty 50

## 2020-09-29 MED ORDER — DEXTROSE 50 % IV SOLN: 1.0000 | Freq: Once | INTRAVENOUS | Status: DC | PRN

## 2020-09-29 MED ORDER — POTASSIUM CHLORIDE CRYS ER 10 MEQ PO TBCR
40.0000 meq | EXTENDED_RELEASE_TABLET | Freq: Once | ORAL | Status: AC
  Administered 2020-09-29: 40 meq via ORAL
  Filled 2020-09-29: qty 4

## 2020-09-29 MED ORDER — ACETAMINOPHEN 650 MG RE SUPP: 650.0000 mg | Freq: Four times a day (QID) | RECTAL | Status: DC | PRN

## 2020-09-29 MED ORDER — FUROSEMIDE 10 MG/ML IJ SOLN
40.0000 mg | Freq: Two times a day (BID) | INTRAMUSCULAR | Status: DC
  Administered 2020-09-29 – 2020-09-30 (×2): 40 mg via INTRAVENOUS
  Filled 2020-09-29 (×4): qty 4

## 2020-09-29 MED ORDER — ACETAMINOPHEN 325 MG PO TABS
650.0000 mg | ORAL_TABLET | Freq: Four times a day (QID) | ORAL | Status: DC | PRN
  Administered 2020-09-29: 650 mg via ORAL
  Filled 2020-09-29: qty 2

## 2020-09-29 MED ORDER — GLUCOSE 40 % PO GEL: 1.0000 | ORAL | Status: DC

## 2020-09-29 MED ORDER — POTASSIUM CHLORIDE CRYS ER 10 MEQ PO TBCR: 40.0000 meq | EXTENDED_RELEASE_TABLET | ORAL | Status: DC

## 2020-09-29 MED ORDER — HYDRALAZINE HCL 25 MG PO TABS
25.0000 mg | ORAL_TABLET | Freq: Four times a day (QID) | ORAL | Status: DC | PRN
  Administered 2020-09-29: 25 mg via ORAL
  Filled 2020-09-29: qty 1

## 2020-09-29 MED ORDER — FUROSEMIDE 10 MG/ML IJ SOLN
40.0000 mg | Freq: Once | INTRAMUSCULAR | Status: AC
  Administered 2020-09-29: 40 mg via INTRAVENOUS
  Filled 2020-09-29: qty 4

## 2020-09-29 MED ORDER — POTASSIUM CHLORIDE CRYS ER 20 MEQ PO TBCR
40.0000 meq | EXTENDED_RELEASE_TABLET | Freq: Once | ORAL | Status: AC
  Administered 2020-09-29: 40 meq via ORAL
  Filled 2020-09-29: qty 2

## 2020-09-29 NOTE — H&P (Signed)
History and Physical    [name] OMV:[id number] DOB: 15-Mar-1940 DOA: 09/29/2020  PCP: Mike Craze, DO Patient coming from: Home  Chief Complaint: Swelling of legs  HPI: [name] is a 80 y.o. 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  Not hypoxic.  Labs showing WBC 8.6, hemoglobin 10.4 (baseline 11-12 range), platelet count 199k.  Sodium 141, potassium 2.6, chloride 102, bicarb 29, BUN 10, creatinine 1.0 (at baseline), glucose 41.  BNP 208.  Screening COVID and influenza PCR negative.  Magnesium pending.  Chest x-ray showing progressive moderate cardiomegaly suspicious for underlying pericardial effusion, central pulmonary vascular engorgement without overt pulmonary edema. Patient was given 1 amp of D50 and food, IV Lasix 40 mg, and oral potassium 40 mEq.  Patient states her legs have been swollen for weeks.  Denies shortness of breath or chest pain.  Denies history of blood clots.  She does not elevate her legs or use compression stockings.  States her husband administers her medications and she does not know what she takes.  Reports receiving insulin twice a day but does not know the dose.  She thinks it is Humalog 75/25.  States last dose of insulin was this evening prior to coming into the ED.  States her blood sugar was previously high with values in the 200s to 300s but now better with values ranging between 90s to 100s.  Does report chronic poor appetite.  Denies nausea, vomiting, abdominal pain, or diarrhea.  No other complaints.  Review of Systems:  All systems reviewed and apart from history of presenting illness, are negative.  Past Medical History:  Diagnosis Date    Chronic anemia    Chronic diastolic CHF (congestive heart failure) ([place]) 10/03/2013   Chronic GI bleeding    /notes 11/29/2014   Family history of anesthesia complication    "niece has a hard time coming out" (09/15/2012)   Frequent nosebleeds    chronic   Gastric AV malformation    /notes 11/29/2014   GERD (gastroesophageal reflux disease)    Heart murmur 04/01/2017   Moderate AVSC on echo 09/2016   History of blood transfusion "several"   History of epistaxis    HTN (hypertension), benign 03/02/2012   Hyperlipidemia    Iron deficiency anemia    chronic infusions"   Lichen planus    Both lower extremities   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    Past Surgical History:  Procedure Laterality Date   CATARACT EXTRACTION     "I think it was just one eye"   ESOPHAGOGASTRODUODENOSCOPY  02/26/2011   Procedure: ESOPHAGOGASTRODUODENOSCOPY (EGD);  Surgeon: [name], MD;  Location: [name];  Service: Endoscopy;  Laterality: N/A;   ESOPHAGOGASTRODUODENOSCOPY N/A 11/08/2012   Procedure: ESOPHAGOGASTRODUODENOSCOPY (EGD);  Surgeon: [name], MD;  Location: [name];  Service: Endoscopy;  Laterality: N/A;   ESOPHAGOGASTRODUODENOSCOPY N/A 10/04/2013   Procedure: ESOPHAGOGASTRODUODENOSCOPY (EGD);  Surgeon: Winfield Cunas., MD;  Location: [name];  Service: Endoscopy;  Laterality: N/A;  with APC on stand-by   ESOPHAGOGASTRODUODENOSCOPY N/A 07/06/2014   Procedure: ESOPHAGOGASTRODUODENOSCOPY (EGD);  Surgeon: Clarene Essex, MD;  Location: [name];  Service: Endoscopy;  Laterality: N/A;   ESOPHAGOGASTRODUODENOSCOPY N/A 09/05/2014   Procedure: ESOPHAGOGASTRODUODENOSCOPY (EGD);   Surgeon: Laurence Spates, MD;  Location: [name];  Service: Endoscopy;  Laterality: N/A;  APC on standby to control bleeding   ESOPHAGOGASTRODUODENOSCOPY N/A 11/29/2014   Procedure: ESOPHAGOGASTRODUODENOSCOPY (EGD);  Surgeon: [name], MD;  Location: [facility] ENDOSCOPY;  Service: Endoscopy;  Laterality: N/A;   ESOPHAGOGASTRODUODENOSCOPY N/A 09/28/2015   Procedure: ESOPHAGOGASTRODUODENOSCOPY (EGD);  Surgeon: Clarene Essex, MD;  Location: [facility] ENDOSCOPY;  Service: Endoscopy;  Laterality: N/A;   ESOPHAGOGASTRODUODENOSCOPY (EGD) WITH PROPOFOL N/A 12/04/2017   Procedure: ESOPHAGOGASTRODUODENOSCOPY (EGD) WITH PROPOFOL;  Surgeon: [name], MD;  Location: [facility];  Service: Endoscopy;  Laterality: N/A;   ESOPHAGOGASTRODUODENOSCOPY ENDOSCOPY  08/19/2006   with laser treatment   HOT HEMOSTASIS N/A 07/06/2014   Procedure: HOT HEMOSTASIS (ARGON PLASMA COAGULATION/BICAP);  Surgeon: Clarene Essex, MD;  Location: [name];  Service: Endoscopy;  Laterality: N/A;   HOT HEMOSTASIS N/A 09/28/2015   Procedure: HOT HEMOSTASIS (ARGON PLASMA COAGULATION/BICAP);  Surgeon: Clarene Essex, MD;  Location: [facility] ENDOSCOPY;  Service: Endoscopy;  Laterality: N/A;   HOT HEMOSTASIS N/A 12/04/2017   Procedure: HOT HEMOSTASIS (ARGON PLASMA COAGULATION/BICAP);  Surgeon: [name], MD;  Location: [facility];  Service: Endoscopy;  Laterality: N/A;   NASAL HEMORRHAGE CONTROL     "for bleeding"    SAVORY DILATION  02/26/2011   Procedure: SAVORY DILATION;  Surgeon: [name], MD;  Location: [name];  Service: Endoscopy;  Laterality: N/A;  c-arm needed   SUBMUCOSAL INJECTION  12/04/2017   Procedure: SUBMUCOSAL INJECTION;  Surgeon: [name], MD;  Location: [place];  Service: Endoscopy;;     reports that she quit smoking about 49 years ago. Her smoking use included cigarettes. She has a 20.00 pack-year smoking history. She has never used smokeless tobacco. She reports that she does not drink alcohol  and does not use drugs.  Allergies  Allergen Reactions   Aspirin Nausea And Vomiting    Other reaction(s): Other (See Comments)    Family History  Problem Relation Age of Onset   Stroke Father    Healthy Mother    Breast cancer Other    Diabetes Niece    Malignant hyperthermia Neg Hx    Seizures Neg Hx     Prior to Admission medications   Medication Sig Start Date End Date Taking? Authorizing Provider  ACCU-CHEK GUIDE test strip USE AS INSTRUCTED TO CHECK BLOOD SUGAR 3 TIMES DAILY. 08/08/20   Elayne Snare, MD  Accu-Chek Softclix Lancets lancets 1 each by Other route 3 (three) times daily. Use as instructed to check blood sugar 3 times per day dx code E11.65 02/01/20   Elayne Snare, MD  amLODipine (NORVASC) 10 MG tablet Take 1 tablet (10 mg total) by mouth daily. 01/02/20   Madalyn Rob, MD  amoxicillin (AMOXIL) 875 MG tablet SMARTSIG:1 Tablet(s) By Mouth Every 12 Hours 05/03/20   [provider]  cetaphil (CETAPHIL) lotion Apply 1 application topically as needed for dry skin. 02/18/16   Rice, Resa Miner, MD  Continuous Blood Gluc Sensor (FREESTYLE LIBRE 2 SENSOR) MISC 2 Devices by Does not apply route every 14 (fourteen) days. 04/25/20   Elayne Snare, MD  donepezil (ARICEPT) 10 MG tablet TAKE 1 TABLET  BY MOUTH AT BEDTIME 09/07/20   [name], Areeg N, DO  Elastic Bandages & Supports (MEDICAL COMPRESSION STOCKINGS) MISC Wear as much as possible while awake to reduce swelling 04/27/16   Minus Liberty, MD  Ferrous Fumarate (FERROCITE) 324 (106 Fe) MG TABS tablet Take 1 tablet (106 mg of iron total) by mouth every other day. 01/02/20   Madalyn Rob, MD  gabapentin (NEURONTIN) 100 MG capsule TAKE 1 CAPSULE BY MOUTH TWICE DAILY 08/16/20   Katsadouros, Vasilios, MD  glucose blood (ACCU-CHEK GUIDE) test strip USE AS INSTRUCTED TO CHECK BLOOD SUGAR 3 TIMES DAILY. 07/13/20   [provider]  HUMALOG MIX 75/25 KWIKPEN (75-25) 100 UNIT/ML [employer] INJECT [place] ONCE DAILY  04/17/20   Elayne Snare, MD  ibuprofen (ADVIL) 600 MG tablet Take by mouth. 05/03/20   [provider]  insulin lispro (HUMALOG) 100 UNIT/ML KwikPen INJECT 3 UNITS SUBCUTANEOUSLY TWICE DAILY WITH LUNCH AND DINNER; IF HEAVY CARB BREAKFAST ALSO INJECT 3 UNITS 06/07/20   [name], Areeg N, DO  Insulin Pen Needle (B-D UF III MINI PEN NEEDLES) 31G X 5 MM MISC USE TO INJECT INSULIN 3 TIMES A DAY 04/17/20   Elayne Snare, MD  lactulose (CHRONULAC) 10 GM/15ML solution TAKE 15 ML BY MOUTH TWO TIMES DAILY 11/18/17   [place], Carley D, DO  Lancets MISC 1 each by Other route. 02/01/20   [provider]  levETIRAcetam (KEPPRA) 500 MG tablet TAKE 1 TABLET BY MOUTH TWICE DAILY 06/07/20   [name], Areeg N, DO  memantine (NAMENDA) 10 MG tablet Take 1 tablet (10 mg total) by mouth 2 (two) times daily. 01/02/20   Madalyn Rob, MD  oxymetazoline (AFRIN) 0.05 % nasal spray Place 1 spray into both nostrils 2 (two) times daily as needed (Epistaxis). 10/05/13   Robbie Lis, MD  pantoprazole (PROTONIX) 40 MG tablet Take 1 tablet by mouth once daily 02/28/19   [place], Carley D, DO  Potassium Chloride ER 20 MEQ TBCR Take 20 mEq by mouth 2 (two) times daily. 01/02/20   Madalyn Rob, MD  potassium chloride SA (KLOR-CON) 20 MEQ tablet TAKE 1 TABLET BY MOUTH TWICE DAILY 09/07/20   [name], Areeg N, DO  torsemide (DEMADEX) 20 MG tablet Take 1 tablet (20 mg total) by mouth daily. 01/02/20   Madalyn Rob, MD  TRESIBA FLEXTOUCH 100 UNIT/ML FlexTouch Pen INJECT 10 UNITS SUBCUTANEOUSLY ONCE DAILY 05/08/20   Elayne Snare, MD  triamcinolone ointment (KENALOG) 0.5 % APPLY 1 APPLICATION TOPICALLY TO RASH TWICE DAILY FOR ITCHING 08/16/20   Riesa Pope, MD  TRULICITY 1.5 GB/1.5VV SOPN INJECT 1.5MG  SUBCUTANEOUSLY EVERY WEEK ON THURSDAY 07/29/20   Elayne Snare, MD    Physical Exam: Vitals:   09/29/20 0230 09/29/20 0245 09/29/20 0300 09/29/20 0315  BP: (!) 148/57  (!) 154/54   Pulse: (!) 59 61 62 63  Resp: 15 13 13 19    Temp:      SpO2: 92% 94% 92% 91%    Physical Exam Constitutional:      General: She is not in acute distress. HENT:     Head: Normocephalic and atraumatic.  Eyes:     Extraocular Movements: Extraocular movements intact.     Conjunctiva/sclera: Conjunctivae normal.  Cardiovascular:     Rate and Rhythm: Normal rate and regular rhythm.     Pulses: Normal pulses.  Pulmonary:     Effort: Pulmonary effort is normal. No respiratory distress.     Breath sounds: Normal breath sounds. No wheezing.  Comments: Mild crackles appreciated at the bases Abdominal:     General: Bowel sounds are normal.     Palpations: Abdomen is soft.     Tenderness: There is no abdominal tenderness. There is no guarding.  Musculoskeletal:     Cervical back: Normal range of motion and neck supple.     Right lower leg: Edema present.     Left lower leg: Edema present.     Comments: Significant pitting edema of bilateral lower extremities with fluid-filled skin blisters (RLE slightly warm to touch and greater in size compared to LLE). No obvious erythema or signs of cellulitis or skin infection.  Skin:    General: Skin is warm and dry.  Neurological:     General: No focal deficit present.     Mental Status: She is alert and oriented to person, place, and time.     Labs on Admission: I have personally reviewed following labs and imaging studies  CBC: Recent Labs  Lab 09/29/20 0118  WBC 8.6  NEUTROABS 5.8  HGB 10.4*  HCT 31.8*  MCV 88.8  PLT 741   Basic Metabolic Panel: Recent Labs  Lab 09/29/20 0118  NA 141  K 2.6*  CL 102  CO2 29  GLUCOSE 41*  BUN 10  CREATININE 1.07*  CALCIUM 8.9   GFR: CrCl cannot be calculated (Unknown ideal weight.). Liver Function Tests: Recent Labs  Lab 09/29/20 0118  AST 33  ALT 15  ALKPHOS 151*  BILITOT 1.0  PROT 6.1*  ALBUMIN 3.1*   No results for input(s): LIPASE, AMYLASE in the last 168 hours. No results for input(s): AMMONIA in the last 168  hours. Coagulation Profile: No results for input(s): INR, PROTIME in the last 168 hours. Cardiac Enzymes: No results for input(s): CKTOTAL, CKMB, CKMBINDEX, TROPONINI in the last 168 hours. BNP (last 3 results) No results for input(s): PROBNP in the last 8760 hours. HbA1C: No results for input(s): HGBA1C in the last 72 hours. CBG: Recent Labs  Lab 09/29/20 0335  GLUCAP 43*   Lipid Profile: No results for input(s): CHOL, HDL, LDLCALC, TRIG, CHOLHDL, LDLDIRECT in the last 72 hours. Thyroid Function Tests: No results for input(s): TSH, T4TOTAL, FREET4, T3FREE, THYROIDAB in the last 72 hours. Anemia Panel: No results for input(s): VITAMINB12, FOLATE, FERRITIN, TIBC, IRON, RETICCTPCT in the last 72 hours. Urine analysis:    Component Value Date/Time   COLORURINE TEST TO BE CREDITED, PER DR PATRICK, PATHOLOGY. 03/03/2016 1545   APPEARANCEUR TEST TO BE CREDITED, PER DR PATRICK, PATHOLOGY. 03/03/2016 1545   LABSPEC TEST TO BE CREDITED, PER DR PATRICK, PATHOLOGY. 03/03/2016 1545   LABSPEC 1.025 10/19/2006 1254   PHURINE TEST TO BE CREDITED, PER DR PATRICK, PATHOLOGY. 03/03/2016 1545   GLUCOSEU TEST TO BE CREDITED, PER DR PATRICK, PATHOLOGY. 03/03/2016 1545   HGBUR TEST TO BE CREDITED, PER DR PATRICK, PATHOLOGY. 03/03/2016 1545   BILIRUBINUR neg 05/21/2016 1605   BILIRUBINUR Negative 10/19/2006 1254   KETONESUR TEST TO BE CREDITED, PER DR PATRICK, PATHOLOGY. 03/03/2016 1545   PROTEINUR 30 (A) 09/04/2020 1041   UROBILINOGEN 0.2 05/21/2016 1605   UROBILINOGEN 0.2 10/02/2014 2307   NITRITE neg 05/21/2016 1605   NITRITE TEST TO BE CREDITED, PER DR PATRICK, PATHOLOGY. 03/03/2016 1545   LEUKOCYTESUR Negative 05/21/2016 1605   LEUKOCYTESUR Negative 10/19/2006 1254    Radiological Exams on Admission: DG Chest Portable 1 View  Result Date: 09/29/2020 CLINICAL DATA:  Peripheral edema EXAM: PORTABLE CHEST 1 VIEW COMPARISON:  10/02/2014 FINDINGS: There  is moderate cardiomegaly slightly  progressive since prior examination. Cardiac enlargement may relate to the presence of an underlying pericardial effusion. Central pulmonary arteries are enlarged, similar to that noted on prior examination. There is central pulmonary vascular engorgement without overt pulmonary edema. No pneumothorax or pleural effusion. Right internal jugular chest port tip noted within the superior cavoatrial junction. No acute bone abnormality. IMPRESSION: Progressive, moderate cardiomegaly. Echocardiography may be helpful to assess for the presence of an underlying pericardial effusion. Central pulmonary vascular engorgement without overt pulmonary edema. Enlargement of the central pulmonary arteries in keeping with pulmonary arterial hypertension. Electronically Signed   By: Fidela Salisbury M.D.   On: 09/29/2020 01:40    EKG: Pending at this time.  Assessment/Plan Principal Problem:   Acute on chronic heart failure with preserved ejection fraction (HFpEF) ([name] Active Problems:   HHT (hereditary hemorrhagic telangiectasia) ([name]   HTN (hypertension), benign   CKD (chronic kidney disease), stage III ([name]   Hypoglycemia   Acute on chronic diastolic CHF Patient presenting with significant bilateral lower extremity edema.  BNP 208. Chest x-ray showing progressive moderate cardiomegaly suspicious for underlying pericardial effusion, central pulmonary vascular engorgement without overt pulmonary edema.  Patient is not hypoxic.  Echo done in August 2021 showing EF >75% and grade 1 diastolic dysfunction. -Patient was given IV Lasix 40 mg in the ED.  Continue diuresis with IV Lasix 40 mg twice daily starting in the morning.  Strict intake and output, daily weights.  Low-sodium diet with fluid restriction.  Repeat echocardiogram.  Continuous pulse ox, supplemental oxygen as needed.  Bilateral lower extremity Dopplers ordered to rule out DVT given asymmetric lower extremity edema .  Hypokalemia Likely due to chronic  diuretic use. -Cardiac monitoring.  EKG pending.  Monitor potassium and magnesium levels, replenish as needed.  Hypoglycemia in the setting of insulin-dependent type 2 diabetes Patient reports chronic poor appetite.  Unclear what dose of insulin she is taking at home.  States last dose of insulin was this evening before coming into the ED.  Blood glucose was down to the 40s and patient was given D50 and food in the ED. Repeat blood glucose 192. -Hold insulin at this time.  CBG checks every 4 hours.  Hypoglycemia protocol.  Hereditary hemorrhagic telangiectasia Iron deficiency anemia Not endorsing epistaxis or symptoms of GI bleed at this time.  Hemoglobin 10.4, baseline 11-12 range. -Outpatient hematology follow-up  CKD stage III Stable.  Renal function at baseline. -Continue to monitor  Hypertension Hyperlipidemia Seizure disorder Dementia -Pharmacy med rec pending.   DVT prophylaxis: Avoiding chemical DVT prophylaxis given history of hereditary hemorrhagic telangiectasia with history of frequent epistaxis and GI bleed.  Holding SCDs until bilateral lower extremity Dopplers done to rule out DVT. Code Status: Patient wishes to be full code. Family Communication: No family available at this time. Disposition Plan: Status is: Observation  The patient remains OBS appropriate and will d/c before 2 midnights.  Dispo: The patient is from: Home              Anticipated d/c is to: Home              Patient currently is not medically stable to d/c.   Difficult to place patient No  Level of care: Level of care: Telemetry  The medical decision making on this patient was of high complexity and the patient is at high risk for clinical deterioration, therefore this is a level 3 visit.  Shela Leff MD [facility]  If 7PM-7AM, please contact night-coverage [email or web address]  09/29/2020, 4:47 AM

## 2020-09-29 NOTE — ED Provider Notes (Signed)
Emergency Department Provider Note   I have reviewed the triage vital signs and the nursing notes.   HISTORY  Chief Complaint No chief complaint on file.   HPI [name] is a 80 y.o. female with past medical history reviewed below including CHF presents with worsening leg swelling.  Patient reports being started on diuretic, torsemide, in the last 2 days.  She is been compliant with this medication and does feel that she is urinating more than normal but legs remain swollen.  Her husband states that she has developed some weeping areas of the legs and is having some increased difficulty with walking due to the legs feeling heavy.  She ambulates with a walker.  No falls.  No fevers. No CP or SOB symptoms.  Patient ultimately arrived to the emergency department by EMS.  They reported largely normal vital signs including oxygen saturation on room air.  Past Medical History:  Diagnosis Date   Chronic anemia    Chronic diastolic CHF (congestive heart failure) ([place]) 10/03/2013   Chronic GI bleeding    /notes 11/29/2014   Family history of anesthesia complication    "niece has a hard time coming out" (09/15/2012)   Frequent nosebleeds    chronic   Gastric AV malformation    /notes 11/29/2014   GERD (gastroesophageal reflux disease)    Heart murmur 04/01/2017   Moderate AVSC on echo 09/2016   History of blood transfusion "several"   History of epistaxis    HTN (hypertension), benign 03/02/2012   Hyperlipidemia    Iron deficiency anemia    chronic infusions"   Lichen planus    Both lower extremities   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    Patient Active Problem List    Diagnosis Date Noted   Hypoglycemia 09/29/2020   Impacted cerumen of left ear 07/15/2020   Hearing loss 05/29/2020   Abnormal LFTs 08/09/2019   Hypervolemia due to congestive heart failure ([place]) 08/02/2019   Neuropathy 07/27/2019   Hyperkalemia 10/18/2018   Port-A-Cath in place 05/23/2018   Iron deficiency anemia due to chronic blood loss 04/19/2018   Acute right hip pain 03/10/2018   Hyperpigmented skin lesion 02/07/2018   Leg weakness, bilateral 12/23/2017   Hematemesis 12/04/2017   Pain localized to upper abdomen 10/05/2017   Lower extremity pain, bilateral 07/15/2017   Pulmonary HTN ([place]) 04/01/2017   Heart murmur 04/01/2017   Overgrown toenails 03/18/2017   Osteopenia after menopause 01/13/2017   Epistaxis 07/30/2016   Venous insufficiency of both lower extremities 04/27/2016   Anemia due to chronic blood loss    Dry skin 02/17/2016   Peripheral vascular disease ([place]) 12/30/2015   Pain in joint of right shoulder 10/22/2015   PAF (paroxysmal atrial fibrillation) ([place]) 23/53/6144   Lichen planus 31/54/0086   Healthcare maintenance 06/20/2015   Seborrheic keratosis 06/20/2015   Memory loss 01/30/2015   Simple febrile convulsions ([place]) 10/21/2014   Hyperammonemia ([place]) 10/03/2014   CKD (chronic kidney disease), stage III ([name]    Seizures ([place]) 09/2014   Chronic diastolic CHF (congestive heart failure) ([place]) 10/03/2013   Acute on chronic heart failure  with preserved ejection fraction (HFpEF) ([place]) 10/03/2013   DM (diabetes mellitus), type 2, uncontrolled, with renal complications ([place]) 39/76/7341   HTN (hypertension), benign 03/02/2012   Fatigue 07/10/2011   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    Past Surgical History:  Procedure Laterality Date   CATARACT EXTRACTION     "I think it was just one eye"   ESOPHAGOGASTRODUODENOSCOPY  02/26/2011   Procedure: ESOPHAGOGASTRODUODENOSCOPY (EGD);  Surgeon: [name], MD;   Location: [name];  Service: Endoscopy;  Laterality: N/A;   ESOPHAGOGASTRODUODENOSCOPY N/A 11/08/2012   Procedure: ESOPHAGOGASTRODUODENOSCOPY (EGD);  Surgeon: [name], MD;  Location: [name];  Service: Endoscopy;  Laterality: N/A;   ESOPHAGOGASTRODUODENOSCOPY N/A 10/04/2013   Procedure: ESOPHAGOGASTRODUODENOSCOPY (EGD);  Surgeon: Winfield Cunas., MD;  Location: [name];  Service: Endoscopy;  Laterality: N/A;  with APC on stand-by   ESOPHAGOGASTRODUODENOSCOPY N/A 07/06/2014   Procedure: ESOPHAGOGASTRODUODENOSCOPY (EGD);  Surgeon: Clarene Essex, MD;  Location: [name];  Service: Endoscopy;  Laterality: N/A;   ESOPHAGOGASTRODUODENOSCOPY N/A 09/05/2014   Procedure: ESOPHAGOGASTRODUODENOSCOPY (EGD);  Surgeon: Laurence Spates, MD;  Location: [name];  Service: Endoscopy;  Laterality: N/A;  APC on standby to control bleeding   ESOPHAGOGASTRODUODENOSCOPY N/A 11/29/2014   Procedure: ESOPHAGOGASTRODUODENOSCOPY (EGD);  Surgeon: [name], MD;  Location: [facility] ENDOSCOPY;  Service: Endoscopy;  Laterality: N/A;   ESOPHAGOGASTRODUODENOSCOPY N/A 09/28/2015   Procedure: ESOPHAGOGASTRODUODENOSCOPY (EGD);  Surgeon: Clarene Essex, MD;  Location: [facility] ENDOSCOPY;  Service: Endoscopy;  Laterality: N/A;   ESOPHAGOGASTRODUODENOSCOPY (EGD) WITH PROPOFOL N/A 12/04/2017   Procedure: ESOPHAGOGASTRODUODENOSCOPY (EGD) WITH PROPOFOL;  Surgeon: [name], MD;  Location: [facility];  Service: Endoscopy;  Laterality: N/A;   ESOPHAGOGASTRODUODENOSCOPY ENDOSCOPY  08/19/2006   with laser treatment   HOT HEMOSTASIS N/A 07/06/2014   Procedure: HOT HEMOSTASIS (ARGON PLASMA COAGULATION/BICAP);  Surgeon: Clarene Essex, MD;  Location: [name];  Service: Endoscopy;  Laterality: N/A;   HOT HEMOSTASIS N/A 09/28/2015   Procedure: HOT HEMOSTASIS (ARGON PLASMA COAGULATION/BICAP);  Surgeon: Clarene Essex, MD;  Location: [facility] ENDOSCOPY;  Service: Endoscopy;  Laterality: N/A;   HOT HEMOSTASIS N/A 12/04/2017    Procedure: HOT HEMOSTASIS (ARGON PLASMA COAGULATION/BICAP);  Surgeon: [name], MD;  Location: [facility];  Service: Endoscopy;  Laterality: N/A;   NASAL HEMORRHAGE CONTROL     "for bleeding"    SAVORY DILATION  02/26/2011   Procedure: SAVORY DILATION;  Surgeon: [name], MD;  Location: [name];  Service: Endoscopy;  Laterality: N/A;  c-arm needed   SUBMUCOSAL INJECTION  12/04/2017   Procedure: SUBMUCOSAL INJECTION;  Surgeon: [name], MD;  Location: [facility] ENDOSCOPY;  Service: Endoscopy;;    Allergies Aspirin  Family History  Problem Relation Age of Onset   Stroke Father    Healthy Mother    Breast cancer Other    Diabetes Niece    Malignant hyperthermia Neg Hx    Seizures Neg Hx     Social History Social History   Tobacco Use   Smoking status: Former    Packs/day: 1.00    Years: 20.00    Pack years: 20.00    Types: Cigarettes    Quit date: 02/10/1971    Years since quitting: 49.6   Smokeless tobacco: Never   Tobacco comments:    09/15/2012 "smoked 50-60 yr ago"  Vaping Use   Vaping Use: Never used  Substance Use Topics   Alcohol use: No    Alcohol/week: 0.0 standard drinks   Drug use: No  Review of Systems  Constitutional: No fever/chills Eyes: No visual changes. ENT: No sore throat. Cardiovascular: Denies chest pain. Positive worsening LE edema.  Respiratory: Denies shortness of breath. Gastrointestinal: No abdominal pain.  No nausea, no vomiting.  No diarrhea.  No constipation. Genitourinary: Negative for dysuria. Musculoskeletal: Negative for back pain. Skin: Negative for rash.  Neurological: Negative for headaches, focal weakness or numbness.  10-point ROS otherwise negative.  ____________________________________________   PHYSICAL EXAM:  VITAL SIGNS: ED Triage Vitals [09/29/20 0041]  Enc Vitals Group     BP (!) 179/62     Pulse Rate 69     Resp 16     Temp 98.5 F (36.9 C)     Temp src      SpO2 95 %    Constitutional: Alert and oriented. Well appearing and in no acute distress. Eyes: Conjunctivae are normal.  Head: Atraumatic. Nose: No congestion/rhinnorhea. Mouth/Throat: Mucous membranes are moist.  Neck: No stridor.  Cardiovascular: Normal rate, regular rhythm. Good peripheral circulation. Grossly normal heart sounds.   Respiratory: Normal respiratory effort.  No retractions. Lungs CTAB. Gastrointestinal: Soft and nontender. No distention.  Musculoskeletal: No lower extremity tenderness with 2+ pitting edema bilaterally to the knees. No gross deformities of extremities. Neurologic:  Normal speech and language. No gross focal neurologic deficits are appreciated.  Skin:  Skin is warm, dry and intact. No cellulitis or venous stasis dermatitis.    ____________________________________________   LABS (all labs ordered are listed, but only abnormal results are displayed)  Labs Reviewed  COMPREHENSIVE METABOLIC PANEL - Abnormal; Notable for the following components:      Result Value   Potassium 2.6 (*)    Glucose, Bld 41 (*)    Creatinine, Ser 1.07 (*)    Total Protein 6.1 (*)    Albumin 3.1 (*)    Alkaline Phosphatase 151 (*)    GFR, Estimated 53 (*)    All other components within normal limits  BRAIN NATRIURETIC PEPTIDE - Abnormal; Notable for the following components:   B Natriuretic Peptide 208.3 (*)    All other components within normal limits  CBC WITH DIFFERENTIAL/PLATELET - Abnormal; Notable for the following components:   RBC 3.58 (*)    Hemoglobin 10.4 (*)    HCT 31.8 (*)    Abs Immature Granulocytes 0.08 (*)    All other components within normal limits  BASIC METABOLIC PANEL - Abnormal; Notable for the following components:   Potassium 3.2 (*)    Glucose, Bld 221 (*)    Creatinine, Ser 1.13 (*)    GFR, Estimated 49 (*)    All other components within normal limits  GLUCOSE, CAPILLARY - Abnormal; Notable for the following components:   Glucose-Capillary 188 (*)     All other components within normal limits  GLUCOSE, CAPILLARY - Abnormal; Notable for the following components:   Glucose-Capillary 223 (*)    All other components within normal limits  GLUCOSE, CAPILLARY - Abnormal; Notable for the following components:   Glucose-Capillary 191 (*)    All other components within normal limits  BASIC METABOLIC PANEL - Abnormal; Notable for the following components:   Glucose, Bld 109 (*)    Creatinine, Ser 1.01 (*)    GFR, Estimated 56 (*)    All other components within normal limits  GLUCOSE, CAPILLARY - Abnormal; Notable for the following components:   Glucose-Capillary 174 (*)    All other components within normal limits  GLUCOSE, CAPILLARY - Abnormal; Notable for the following  components:   Glucose-Capillary 134 (*)    All other components within normal limits  GLUCOSE, CAPILLARY - Abnormal; Notable for the following components:   Glucose-Capillary 102 (*)    All other components within normal limits  GLUCOSE, CAPILLARY - Abnormal; Notable for the following components:   Glucose-Capillary 118 (*)    All other components within normal limits  CBG MONITORING, ED - Abnormal; Notable for the following components:   Glucose-Capillary 43 (*)    All other components within normal limits  CBG MONITORING, ED - Abnormal; Notable for the following components:   Glucose-Capillary 192 (*)    All other components within normal limits  RESP PANEL BY RT-PCR (FLU A&B, COVID) ARPGX2  MAGNESIUM  GLUCOSE, CAPILLARY     ____________________________________________  RADIOLOGY  No results found.  ____________________________________________   PROCEDURES  Procedure(s) performed:   Procedures   ____________________________________________   INITIAL IMPRESSION / ASSESSMENT AND PLAN / ED COURSE  Pertinent labs & imaging results that were available during my care of the patient were reviewed by me and considered in my medical decision making (see  chart for details).   Patient presents emergency department with bilateral lower extremity edema.  She has some fluid filled small blisters/weeping consistent with peripheral edema.  No cellulitis or sign of infection.  No flaccid bullae.  No petechial rash.  Lungs are clear with normal vitals.  Has been on torsemide for the past 2 days.  Plan for lab work along with chest x-ray and reassess. Patient has not been wrapping or elevating the legs.   Labs reviewed. Plan for admit for diuresis.   Discussed patient's case with TRH to request admission. Patient and family (if present) updated with plan. Care transferred to [facility] service.  I reviewed all nursing notes, vitals, pertinent old records, EKGs, labs, imaging (as available).  ____________________________________________  FINAL CLINICAL IMPRESSION(S) / ED DIAGNOSES  Final diagnoses:  Hypokalemia  Peripheral edema  Cardiomegaly     MEDICATIONS GIVEN DURING THIS VISIT:  Medications  dextrose 50 % solution 50 mL (50 mLs Intravenous Given 09/29/20 0353)  potassium chloride SA (KLOR-CON) CR tablet 40 mEq (40 mEq Oral Given 09/29/20 0359)  furosemide (LASIX) injection 40 mg (40 mg Intravenous Given 09/29/20 0354)  potassium chloride (KLOR-CON) CR tablet 40 mEq (40 mEq Oral Given 09/29/20 0655)  potassium chloride (KLOR-CON) CR tablet 40 mEq (40 mEq Oral Given 09/29/20 1811)      Note:  This document was prepared using Dragon voice recognition software and may include unintentional dictation errors.  Nanda Quinton, MD, [facility] Emergency Medicine    Gjon Letarte, [facility], MD 10/05/20 [phone]

## 2020-09-29 NOTE — ED Notes (Signed)
Patient ate apple sauce and drunk 2 cups of orange juice with potassium pills.

## 2020-09-29 NOTE — Progress Notes (Signed)
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  BNP was elevated to 208.  Chest x-ray showed progressive moderate cardiomegaly suspicious for underlying pericardial effusion, central pulmonary vascular engorgement and overt pulmonary edema.  Echocardiogram on 8/5/20221showed small pericardial effusion.  Vitals:   09/29/20 0915 09/29/20 1019  BP: (!) 171/72 (!) 171/64  Pulse: (!) 59   Resp: 16   Temp: 97.6 F (36.4 C)   SpO2: 100%       A/P Acute on chronic diastolic CHF -Patient started on IV Lasix 40 mg twice a day -2D echo ordered -Strict intake and output, continue supplemental oxygen  Hyperglycemia in setting of diabetes mellitus type 2 -CBG was down in 40s -Received 1 amp of D50 in the ED -Repeat CBG was 192  Hereditary hemorrhagic telangiectasia -No epistaxis or other symptoms of GI bleed -Hemoglobin is 10.4 -Outpatient hematology follow-up      [place] Hospitalist Pager- [phone]

## 2020-09-29 NOTE — TOC Initial Note (Signed)
Transition of Care [facility]) - Initial/Assessment Note    Patient Details  Name: [name] MRN: [id number] Date of Birth: 1940-03-19  [facility]) CM/SW Contact:    Iona Beard, [place] Phone Number: 09/29/2020, 9:55 AM  Clinical Narrative:                 [facility] consulted for CHF screen. CSW spoke with pt to complete assessment. Pt stats that she lives with her husband. Pt is independent in completing her ADLs. Pt does not drive but her niece provides transportation when needed. Pt has not had [place] services but states she "needs it". Pt has a rollator to use when needed.   Pt states that she does not weigh herself daily. Pt educated on importance of daily weights and when to reach out to her cardiologist Dr. Radford Pax. Pts husband ensures that pt takes all medications as she should. Pt tries to follow a heart healthy diet. TOC to follow for discharge needs.   Expected Discharge Plan: Home/Self Care Barriers to Discharge: Continued Medical Work up   Patient Goals and CMS Choice Patient states their goals for this hospitalization and ongoing recovery are:: Return home CMS Medicare.gov Compare Post Acute Care list provided to:: Patient Choice offered to / list presented to : Patient  Expected Discharge Plan and Services Expected Discharge Plan: Home/Self Care In-house Referral: Clinical Social Work Discharge Planning Services: CM Consult Post Acute Care Choice: [place] arrangements for the past 2 months: Single Family Home                                      Prior Living Arrangements/Services Living arrangements for the past 2 months: Single Family Home Lives with:: Spouse Patient language and need for interpreter reviewed:: Yes Do you feel safe going back to the place where you live?: Yes      Need for Family Participation in Patient Care: Yes (Comment) Care giver support system in place?: Yes (comment) Current home services: DME Criminal  Activity/Legal Involvement Pertinent to Current Situation/Hospitalization: No - Comment as needed  Activities of Daily Living Home Assistive Devices/Equipment: Gilford Rile (specify type) ADL Screening (condition at time of admission) Patient's cognitive ability adequate to safely complete daily activities?: Yes Is the patient deaf or have difficulty hearing?: No Does the patient have difficulty seeing, even when wearing glasses/contacts?: No Does the patient have difficulty concentrating, remembering, or making decisions?: No Patient able to express need for assistance with ADLs?: Yes Does the patient have difficulty dressing or bathing?: Yes Independently performs ADLs?: No Communication: Independent Dressing (OT): Needs assistance Is this a change from baseline?: Pre-admission baseline Grooming: Needs assistance Is this a change from baseline?: Pre-admission baseline Feeding: Independent Bathing: Needs assistance Is this a change from baseline?: Pre-admission baseline Toileting: Needs assistance Is this a change from baseline?: Pre-admission baseline In/Out Bed: Needs assistance Is this a change from baseline?: Pre-admission baseline Walks in Home: Needs assistance Is this a change from baseline?: Pre-admission baseline Does the patient have difficulty walking or climbing stairs?: Yes Weakness of Legs: Both Weakness of Arms/Hands: None  Permission Sought/Granted                  Emotional Assessment Appearance:: Appears stated age Attitude/Demeanor/Rapport: Engaged Affect (typically observed): Accepting Orientation: : Oriented to Self, Oriented to Place, Oriented to  Time, Oriented to Situation Alcohol / Substance Use: Not  Applicable Psych Involvement: No (comment)  Admission diagnosis:  Cardiomegaly [I51.7] Hypokalemia [E87.6] Peripheral edema [R60.9] Volume overload [E87.70] Patient Active Problem List   Diagnosis Date Noted   Hypoglycemia 09/29/2020   Impacted  cerumen of left ear 07/15/2020   Hearing loss 05/29/2020   Abnormal LFTs 08/09/2019   Hypervolemia due to congestive heart failure ([place]) 08/02/2019   Neuropathy 07/27/2019   Hyperkalemia 10/18/2018   Port-A-Cath in place 05/23/2018   Iron deficiency anemia due to chronic blood loss 04/19/2018   Acute right hip pain 03/10/2018   Hyperpigmented skin lesion 02/07/2018   Leg weakness, bilateral 12/23/2017   Hematemesis 12/04/2017   Pain localized to upper abdomen 10/05/2017   Lower extremity pain, bilateral 07/15/2017   Pulmonary HTN ([place]) 04/01/2017   Heart murmur 04/01/2017   Overgrown toenails 03/18/2017   Osteopenia after menopause 01/13/2017   Epistaxis 07/30/2016   Venous insufficiency of both lower extremities 04/27/2016   Anemia due to chronic blood loss    Dry skin 02/17/2016   Peripheral vascular disease ([place]) 12/30/2015   Pain in joint of right shoulder 10/22/2015   PAF (paroxysmal atrial fibrillation) ([place]) 35/46/5681   Lichen planus 27/51/7001   Healthcare maintenance 06/20/2015   Seborrheic keratosis 06/20/2015   Memory loss 01/30/2015   Simple febrile convulsions ([place]) 10/21/2014   Hyperammonemia ([place]) 10/03/2014   CKD (chronic kidney disease), stage III ([name]    Seizures ([place]) 09/2014   Chronic diastolic CHF (congestive heart failure) ([place]) 10/03/2013   Acute on chronic heart failure with preserved ejection fraction (HFpEF) ([place]) 10/03/2013   DM (diabetes mellitus), type 2, uncontrolled, with renal complications ([place]) 74/94/4967   HTN (hypertension), benign 03/02/2012   Fatigue 07/10/2011   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

## 2020-09-29 NOTE — ED Triage Notes (Signed)
Patient A&Ox4  X3days with leg swelling PCP changed her diuretic 3days ago.   160/78-66-18-95%

## 2020-09-29 NOTE — Progress Notes (Signed)
  Echocardiogram 2D Echocardiogram has been performed.  Randa Lynn Sophiah Rolin 09/29/2020, 2:09 PM

## 2020-09-29 NOTE — ED Notes (Signed)
Labeled urine specimen and culture sent to lab. ENMiles 

## 2020-09-30 ENCOUNTER — Telehealth: Payer: Self-pay | Admitting: Endocrinology

## 2020-09-30 ENCOUNTER — Observation Stay (HOSPITAL_BASED_OUTPATIENT_CLINIC_OR_DEPARTMENT_OTHER): Payer: Medicare Other

## 2020-09-30 DIAGNOSIS — R609 Edema, unspecified: Secondary | ICD-10-CM

## 2020-09-30 DIAGNOSIS — I5033 Acute on chronic diastolic (congestive) heart failure: Secondary | ICD-10-CM | POA: Diagnosis not present

## 2020-09-30 DIAGNOSIS — E162 Hypoglycemia, unspecified: Secondary | ICD-10-CM

## 2020-09-30 DIAGNOSIS — E876 Hypokalemia: Secondary | ICD-10-CM

## 2020-09-30 DIAGNOSIS — I10 Essential (primary) hypertension: Secondary | ICD-10-CM | POA: Diagnosis not present

## 2020-09-30 LAB — BASIC METABOLIC PANEL
Anion gap: 6 (ref 5–15)
BUN: 9 mg/dL (ref 8–23)
CO2: 29 mmol/L (ref 22–32)
Calcium: 9.1 mg/dL (ref 8.9–10.3)
Chloride: 106 mmol/L (ref 98–111)
Creatinine, Ser: 1.01 mg/dL — ABNORMAL HIGH (ref 0.44–1.00)
GFR, Estimated: 56 mL/min — ABNORMAL LOW (ref >60)
Glucose, Bld: 109 mg/dL — ABNORMAL HIGH (ref 70–99)
Potassium: 4.1 mmol/L (ref 3.5–5.1)
Sodium: 141 mmol/L (ref 135–145)

## 2020-09-30 LAB — GLUCOSE, CAPILLARY
Glucose-Capillary: 102 mg/dL — ABNORMAL HIGH (ref 70–99)
Glucose-Capillary: 96 mg/dL (ref 70–99)
Glucose-Capillary: 118 mg/dL — ABNORMAL HIGH (ref 70–99)

## 2020-09-30 MED ORDER — CHLORHEXIDINE GLUCONATE CLOTH 2 % EX PADS: 6.0000 | MEDICATED_PAD | Freq: Every day | CUTANEOUS | Status: DC

## 2020-09-30 MED ORDER — LEVETIRACETAM 500 MG PO TABS
500.0000 mg | ORAL_TABLET | Freq: Two times a day (BID) | ORAL | Status: DC
  Administered 2020-09-30: 500 mg via ORAL
  Filled 2020-09-30: qty 1

## 2020-09-30 NOTE — Progress Notes (Signed)
Bilateral lower extremity venous duplex completed. Refer to "CV Proc" under chart review to view preliminary results.  09/30/2020 10:55 AM Kelby Aline., MHA, RVT, RDCS, RDMS

## 2020-09-30 NOTE — Discharge Summary (Addendum)
Physician Discharge Summary  [name] OAC:[id number] DOB: 1940/11/29 DOA: 09/29/2020  PCP: Mike Craze, DO  Admit date: 09/29/2020 Discharge date: 09/30/2020  Time spent: 60 minutes  Recommendations for Outpatient Follow-up:  Follow-up PCP in 1 week Follow-up endocrinology as outpatient   Discharge Diagnoses:  Principal Problem:   Acute on chronic heart failure with preserved ejection fraction (HFpEF) ([place]) Active Problems:   HHT (hereditary hemorrhagic telangiectasia) ([name]   HTN (hypertension), benign   CKD (chronic kidney disease), stage III ([place])   Hypoglycemia   Discharge Condition: Stable  Diet recommendation: Carb modified diet  Filed Weights   09/29/20 0536 09/30/20 0500 09/30/20 1604  Weight: 71.3 kg 68.2 kg 68.2 kg    History of present illness:   80 year old female with history of dementia, diabetes mellitus type 2, 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  BNP was elevated to 208.  Chest x-ray showed progressive moderate cardiomegaly suspicious for underlying pericardial effusion, central pulmonary vascular engorgement and overt pulmonary edema.  Echocardiogram on 8/5/20221showed small pericardial effusion.  Hospital Course:   Acute on chronic diastolic CHF -Improved, patient presented with lower extremity swelling and shortness of breath -Significantly improved with Lasix -2D echo shows EF 65 to 70%  with grade 2 diastolic dysfunction -Continue taking torsemide at home dose  Hyperglycemia in the setting of diabetes mellitus type 2 -Patient CBG was in 40s when she arrived in the ED -Received 1 amp of D50, repeat CBG was 192 -Patient is on multiple insulin formulations at home including Humalog 01/09, Tresiba, Trulicity, NovoLog meal  coverage. -Patient's blood glucose was in low 100s despite being not on any insulin at home -We will discontinue Tresiba, Humolog meal coverage -Continue taking Humalog 75/25 10 units subcu daily along with Trulicity weekly -Recommend to follow-up with endocrinology as outpatient  Hereditary hemorrhagic telangiectasia -Hemoglobin stable -No symptoms of GI bleeding or epistaxis  Hypokalemia -Replete  CKD stage IIIa -Creatinine at baseline  Patient to go home with home health PT   Procedures:   Consultations:   Discharge Exam: Vitals:   09/30/20 1429 09/30/20 1604  BP: (!) 146/79 (!) 146/79  Pulse: 81 81  Resp: 16 16  Temp: 98.2 F (36.8 C) 98.2 F (36.8 C)  SpO2: 98%     General: Appears in no acute distress Cardiovascular: S1-S2, regular Respiratory: Clear to auscultation bilaterally  Discharge Instructions   Discharge Instructions     Diet - low sodium heart healthy   Complete by: As directed    Increase activity slowly   Complete by: As directed       Allergies as of 09/30/2020       Reactions   Aspirin Nausea And Vomiting   Other reaction(s): Other (See Comments)        Medication List     STOP taking these medications    Ferrous Fumarate 324 (106 Fe) MG Tabs tablet Commonly known as: Ferrocite   insulin lispro 100 UNIT/ML KwikPen Commonly known as: HUMALOG   lactulose 10 GM/15ML solution Commonly known as: CHRONULAC   pantoprazole 40 MG tablet Commonly known as: PROTONIX   Tresiba FlexTouch 100 UNIT/ML FlexTouch Pen Generic drug: insulin degludec       TAKE these medications    Accu-Chek Guide test strip Generic drug: glucose blood USE AS INSTRUCTED TO CHECK BLOOD SUGAR 3 TIMES  DAILY.   Accu-Chek Guide test strip Generic drug: glucose blood USE AS INSTRUCTED TO CHECK BLOOD SUGAR 3 TIMES DAILY.   Accu-Chek Softclix Lancets lancets 1 each by Other route 3 (three) times daily. Use as instructed to check blood sugar 3  times per day dx code E11.65   Lancets Misc 1 each by Other route.   amLODipine 10 MG tablet Commonly known as: NORVASC Take 1 tablet (10 mg total) by mouth daily.   B-D UF III MINI PEN NEEDLES 31G X 5 MM Misc Generic drug: Insulin Pen Needle USE TO INJECT INSULIN 3 TIMES A DAY   donepezil 10 MG tablet Commonly known as: ARICEPT TAKE 1 TABLET BY MOUTH AT BEDTIME   FreeStyle Libre 2 Sensor Misc 2 Devices by Does not apply route every 14 (fourteen) days.   gabapentin 100 MG capsule Commonly known as: NEURONTIN TAKE 1 CAPSULE BY MOUTH TWICE DAILY   HumaLOG Mix 75/25 KwikPen (75-25) 100 UNIT/ML Kwikpen Generic drug: Insulin Lispro Prot & Lispro INJECT 10 UNITS SUBCUTANEOUSLY ONCE DAILY   levETIRAcetam 500 MG tablet Commonly known as: KEPPRA TAKE 1 TABLET BY MOUTH TWICE DAILY   Medical Compression Stockings Misc Wear as much as possible while awake to reduce swelling   memantine 10 MG tablet Commonly known as: NAMENDA Take 1 tablet (10 mg total) by mouth 2 (two) times daily.   olmesartan 20 MG tablet Commonly known as: BENICAR Take 10 mg by mouth daily.   Potassium Chloride ER 20 MEQ Tbcr Take 20 mEq by mouth 2 (two) times daily.   torsemide 20 MG tablet Commonly known as: DEMADEX Take 1 tablet (20 mg total) by mouth daily.   triamcinolone ointment 0.5 % Commonly known as: KENALOG APPLY 1 APPLICATION TOPICALLY TO RASH TWICE DAILY FOR ITCHING   Trulicity 1.5 GH/8.2XH Sopn Generic drug: Dulaglutide INJECT 1.5MG  SUBCUTANEOUSLY EVERY WEEK ON THURSDAY       Allergies  Allergen Reactions   Aspirin Nausea And Vomiting    Other reaction(s): Other (See Comments)      The results of significant diagnostics from this hospitalization (including imaging, microbiology, ancillary and laboratory) are listed below for reference.    Significant Diagnostic Studies: DG Chest Portable 1 View  Result Date: 09/29/2020 CLINICAL DATA:  Peripheral edema EXAM: PORTABLE CHEST  1 VIEW COMPARISON:  10/02/2014 FINDINGS: There is moderate cardiomegaly slightly progressive since prior examination. Cardiac enlargement may relate to the presence of an underlying pericardial effusion. Central pulmonary arteries are enlarged, similar to that noted on prior examination. There is central pulmonary vascular engorgement without overt pulmonary edema. No pneumothorax or pleural effusion. Right internal jugular chest port tip noted within the superior cavoatrial junction. No acute bone abnormality. IMPRESSION: Progressive, moderate cardiomegaly. Echocardiography may be helpful to assess for the presence of an underlying pericardial effusion. Central pulmonary vascular engorgement without overt pulmonary edema. Enlargement of the central pulmonary arteries in keeping with pulmonary arterial hypertension. Electronically Signed   By: Fidela Salisbury M.D.   On: 09/29/2020 01:40   ECHOCARDIOGRAM COMPLETE  Result Date: 09/29/2020    ECHOCARDIOGRAM REPORT   Patient Name:   [name] Date of Exam: 09/29/2020 Medical Rec #:  [id number]      Height:       63.0 in Accession #:    [id number]     Weight:       157.2 lb Date of Birth:  [birth year]       BSA:  1.745 m Patient Age:    3 years       BP:           150/66 mmHg Patient Gender: F              HR:           65 bpm. Exam Location:  Inpatient Procedure: 2D Echo, Cardiac Doppler and Color Doppler Indications:    I50.33 Acute on chronic diastolic (congestive) heart failure  History:        Patient has prior history of Echocardiogram examinations, most                 recent 09/14/2019. Pulmonary HTN, Signs/Symptoms:Murmur; Risk                 Factors:Hypertension, Diabetes and Dyslipidemia. Seizures. GERD.  Sonographer:    Tiffany Dance RVT Referring Phys: [id number] VASUNDHRA RATHORE IMPRESSIONS  1. Left ventricular ejection fraction, by estimation, is 65 to 70%. The left ventricle has normal function. The left ventricle has no regional wall motion  abnormalities. There is mild left ventricular hypertrophy. Left ventricular diastolic parameters are consistent with Grade II diastolic dysfunction (pseudonormalization).  2. Right ventricular systolic function is normal. The right ventricular size is normal. There is moderately elevated pulmonary artery systolic pressure. The estimated right ventricular systolic pressure is 06.2 mmHg.  3. Left atrial size was severely dilated.  4. Right atrial size was upper normal.  5. A small to moderate pericardial effusion is present. The pericardial effusion is circumferential. There is no evidence of cardiac tamponade.  6. The mitral valve is grossly normal. Mild mitral valve regurgitation.  7. The aortic valve is tricuspid. There is mild calcification of the aortic valve. Aortic valve regurgitation is not visualized. Mild aortic valve stenosis. Aortic valve mean gradient measures 13.6 mmHg. Aortic valve Vmax measures 2.67 m/s.  8. The inferior vena cava is dilated in size with >50% respiratory variability, suggesting right atrial pressure of 8 mmHg. Comparison(s): Prior images reviewed side by side. Pericardial effusion small to moderate without signs of tamponade. FINDINGS  Left Ventricle: Left ventricular ejection fraction, by estimation, is 65 to 70%. The left ventricle has normal function. The left ventricle has no regional wall motion abnormalities. The left ventricular internal cavity size was normal in size. There is  mild left ventricular hypertrophy. Left ventricular diastolic parameters are consistent with Grade II diastolic dysfunction (pseudonormalization). Right Ventricle: The right ventricular size is normal. No increase in right ventricular wall thickness. Right ventricular systolic function is normal. There is moderately elevated pulmonary artery systolic pressure. The tricuspid regurgitant velocity is 3.51 m/s, and with an assumed right atrial pressure of 8 mmHg, the estimated right ventricular systolic  pressure is 69.4 mmHg. Left Atrium: Left atrial size was severely dilated. Right Atrium: Right atrial size was upper normal. Pericardium: A small pericardial effusion is present. The pericardial effusion is circumferential. There is no evidence of cardiac tamponade. Mitral Valve: The mitral valve is grossly normal. Mild mitral valve regurgitation. Tricuspid Valve: The tricuspid valve is grossly normal. Tricuspid valve regurgitation is mild. Aortic Valve: The aortic valve is tricuspid. There is mild calcification of the aortic valve. There is mild to moderate aortic valve annular calcification. Aortic valve regurgitation is not visualized. Mild aortic stenosis is present. Aortic valve mean gradient measures 13.6 mmHg. Aortic valve peak gradient measures 28.6 mmHg. Aortic valve area, by VTI measures 1.18 cm. Pulmonic Valve: The pulmonic valve was grossly normal. Pulmonic valve regurgitation  is trivial. Aorta: The aortic root is normal in size and structure. Venous: The inferior vena cava is dilated in size with greater than 50% respiratory variability, suggesting right atrial pressure of 8 mmHg. IAS/Shunts: No atrial level shunt detected by color flow Doppler.  LEFT VENTRICLE PLAX 2D LVIDd:         4.40 cm LVIDs:         2.90 cm LV PW:         1.30 cm LV IVS:        1.30 cm LVOT diam:     1.60 cm LV SV:         75 LV SV Index:   43 LVOT Area:     2.01 cm  RIGHT VENTRICLE             IVC RV Basal diam:  3.30 cm     IVC diam: 3.00 cm RV Mid diam:    2.40 cm RV S prime:     17.00 cm/s TAPSE (M-mode): 2.1 cm LEFT ATRIUM             Index       RIGHT ATRIUM           Index LA diam:        4.70 cm 2.69 cm/m  RA Area:     20.20 cm LA Vol (A2C):   82.5 ml 47.26 ml/m RA Volume:   55.20 ml  31.62 ml/m LA Vol (A4C):   98.4 ml 56.37 ml/m LA Biplane Vol: 98.1 ml 56.20 ml/m  AORTIC VALVE AV Area (Vmax):    1.25 cm AV Area (Vmean):   1.31 cm AV Area (VTI):     1.18 cm AV Vmax:           267.40 cm/s AV Vmean:           168.400 cm/s AV VTI:            0.634 m AV Peak Grad:      28.6 mmHg AV Mean Grad:      13.6 mmHg LVOT Vmax:         166.50 cm/s LVOT Vmean:        109.700 cm/s LVOT VTI:          0.372 m LVOT/AV VTI ratio: 0.59  AORTA Ao Root diam: 2.80 cm Ao Asc diam:  3.00 cm MITRAL VALVE                TRICUSPID VALVE MV Area (PHT): 3.54 cm     TR Peak grad:   49.3 mmHg MV Decel Time: 215 msec     TR Vmax:        351.00 cm/s MV E velocity: 133.00 cm/s MV A velocity: 136.00 cm/s  SHUNTS MV E/A ratio:  0.98         Systemic VTI:  0.37 m                             Systemic Diam: 1.60 cm Rozann Lesches MD Electronically signed by Rozann Lesches MD Signature Date/Time: 09/29/2020/2:33:43 PM    Final    VAS [place] LOWER EXTREMITY VENOUS (DVT)  Result Date: 09/30/2020  Lower Venous DVT Study Patient Name:  [name]  Date of Exam:   09/30/2020 Medical Rec #: [id number]       Accession #:    [id number] Date of Birth: [birth year]  Patient Gender: F Patient Age:   46 years Exam Location:  [facility] Procedure:      VAS [place] LOWER EXTREMITY VENOUS (DVT) Referring Phys: Wandra Feinstein RATHORE --------------------------------------------------------------------------------  Indications: Edema.  Limitations: Poor ultrasound/tissue interface. Comparison Study: No prior study Performing Technologist: Maudry Mayhew MHA, RDMS, RVT, RDCS  Examination Guidelines: A complete evaluation includes B-mode imaging, spectral Doppler, color Doppler, and power Doppler as needed of all accessible portions of each vessel. Bilateral testing is considered an integral part of a complete examination. Limited examinations for reoccurring indications may be performed as noted. The reflux portion of the exam is performed with the patient in reverse Trendelenburg.  +---------+---------------+---------+-----------+----------+--------------+ RIGHT    CompressibilityPhasicitySpontaneityPropertiesThrombus Aging  +---------+---------------+---------+-----------+----------+--------------+ CFV      Full           Yes      Yes                                 +---------+---------------+---------+-----------+----------+--------------+ SFJ      Full                                                        +---------+---------------+---------+-----------+----------+--------------+ FV Prox  Full                                                        +---------+---------------+---------+-----------+----------+--------------+ FV Mid   Full                                                        +---------+---------------+---------+-----------+----------+--------------+ FV DistalFull                                                        +---------+---------------+---------+-----------+----------+--------------+ PFV      Full                                                        +---------+---------------+---------+-----------+----------+--------------+ POP      Full           Yes      Yes                                 +---------+---------------+---------+-----------+----------+--------------+ PTV      Full                                                        +---------+---------------+---------+-----------+----------+--------------+  PERO     Full                                                        +---------+---------------+---------+-----------+----------+--------------+   +---------+---------------+---------+-----------+----------+--------------+ LEFT     CompressibilityPhasicitySpontaneityPropertiesThrombus Aging +---------+---------------+---------+-----------+----------+--------------+ CFV      Full           Yes      Yes                                 +---------+---------------+---------+-----------+----------+--------------+ SFJ      Full                                                         +---------+---------------+---------+-----------+----------+--------------+ FV Prox  Full                                                        +---------+---------------+---------+-----------+----------+--------------+ FV Mid   Full                                                        +---------+---------------+---------+-----------+----------+--------------+ FV DistalFull                                                        +---------+---------------+---------+-----------+----------+--------------+ PFV      Full                                                        +---------+---------------+---------+-----------+----------+--------------+ POP      Full           Yes      Yes                                 +---------+---------------+---------+-----------+----------+--------------+   Left Technical Findings: Not visualized segments include PTV, peroneal veins.   Summary: BILATERAL: - No evidence of deep vein thrombosis seen in the lower extremities, bilaterally. -No evidence of popliteal cyst, bilaterally.   *See table(s) above for measurements and observations. Electronically signed by Servando Snare MD on 09/30/2020 at 3:05:22 PM.    Final     Microbiology: Recent Results (from the past 240 hour(s))  Resp Panel by RT-PCR (Flu A&B, Covid) Nasopharyngeal Swab     Status: None   Collection Time: 09/29/20  1:51 AM   Specimen: Nasopharyngeal Swab; Nasopharyngeal(NP) swabs in vial transport medium  Result Value Ref Range Status   SARS Coronavirus 2 by RT PCR NEGATIVE NEGATIVE Final    Comment: (NOTE) SARS-CoV-2 target nucleic acids are NOT DETECTED.  The SARS-CoV-2 RNA is generally detectable in upper respiratory specimens during the acute phase of infection. The lowest concentration of SARS-CoV-2 viral copies this assay can detect is 138 copies/mL. A negative result does not preclude SARS-Cov-2 infection and should not be used as the sole basis for treatment  or other patient management decisions. A negative result may occur with  improper specimen collection/handling, submission of specimen other than nasopharyngeal swab, presence of viral mutation(s) within the areas targeted by this assay, and inadequate number of viral copies(<138 copies/mL). A negative result must be combined with clinical observations, patient history, and epidemiological information. The expected result is Negative.  Fact Sheet for Patients:  EntrepreneurPulse.com.au  Fact Sheet for Healthcare Providers:  IncredibleEmployment.be  This test is no t yet approved or cleared by the [place] FDA and  has been authorized for detection and/or diagnosis of SARS-CoV-2 by FDA under an Emergency Use Authorization (EUA). This EUA will remain  in effect (meaning this test can be used) for the duration of the COVID-19 declaration under Section 564(b)(1) of the Act, 21 U.S.C.section 360bbb-3(b)(1), unless the authorization is terminated  or revoked sooner.       Influenza A by PCR NEGATIVE NEGATIVE Final   Influenza B by PCR NEGATIVE NEGATIVE Final    Comment: (NOTE) The Xpert Xpress SARS-CoV-2/FLU/RSV plus assay is intended as an aid in the diagnosis of influenza from Nasopharyngeal swab specimens and should not be used as a sole basis for treatment. Nasal washings and aspirates are unacceptable for Xpert Xpress SARS-CoV-2/FLU/RSV testing.  Fact Sheet for Patients: EntrepreneurPulse.com.au  Fact Sheet for Healthcare Providers: IncredibleEmployment.be  This test is not yet approved or cleared by the [place] FDA and has been authorized for detection and/or diagnosis of SARS-CoV-2 by FDA under an Emergency Use Authorization (EUA). This EUA will remain in effect (meaning this test can be used) for the duration of the COVID-19 declaration under Section 564(b)(1) of the Act, 21 U.S.C. section  360bbb-3(b)(1), unless the authorization is terminated or revoked.  Performed at [facility], [place] [address], [place], [place] [address]      Labs: Basic Metabolic Panel: Recent Labs  Lab 09/29/20 0118 09/29/20 0548 09/30/20 0449  NA 141 140 141  K 2.6* 3.2* 4.1  CL 102 99 106  CO2 29 30 29   GLUCOSE 41* 221* 109*  BUN 10 10 9   CREATININE 1.07* 1.13* 1.01*  CALCIUM 8.9 9.1 9.1  MG  --  1.7  --    Liver Function Tests: Recent Labs  Lab 09/29/20 0118  AST 33  ALT 15  ALKPHOS 151*  BILITOT 1.0  PROT 6.1*  ALBUMIN 3.1*   No results for input(s): LIPASE, AMYLASE in the last 168 hours. No results for input(s): AMMONIA in the last 168 hours. CBC: Recent Labs  Lab 09/29/20 0118  WBC 8.6  NEUTROABS 5.8  HGB 10.4*  HCT 31.8*  MCV 88.8  PLT 199   Cardiac Enzymes: No results for input(s): CKTOTAL, CKMB, CKMBINDEX, TROPONINI in the last 168 hours. BNP: BNP (last 3 results) Recent Labs    09/29/20 0118  BNP 208.3*    ProBNP (last 3 results) No results for input(s): PROBNP in the last 8760 hours.  CBG: Recent Labs  Lab 09/29/20 2053 09/29/20 2350 09/30/20 0502 09/30/20 0742 09/30/20 1142  GLUCAP 174* 134* 102* 96 118*       Signed:  Oswald Hillock MD.  [facility] 09/30/2020, 5:28 PM

## 2020-09-30 NOTE — Telephone Encounter (Signed)
[name] requests to be called at ph# [phone] re: Insulin Regimen for Patient

## 2020-09-30 NOTE — Evaluation (Signed)
Physical Therapy Evaluation Patient Details Name: [name] MRN: [id number] DOB: [birth year] Today's Date: 09/30/2020   History of Present Illness  79 yo female admitted with HF, LE swelling. Hx of dementia, DM, frequent epistaxis, anemia, CHF, Sz, pulm HTN, Sz  Clinical Impression  On eval, pt was Supv-Min guard assist for mobility. She walked ~75 feet with a RW. Pt was soiled-Min A for toileting hygiene. Pt was able to don her depends without assistance. She tolerated activity well. She stated he husband was downstairs waiting for her so they could get home before the rain. Will recommend HHPT f/u to ensure safe transition back into home environment.     Follow Up Recommendations Home health PT;Supervision/Assistance - 24 hour    Equipment Recommendations  None recommended by PT    Recommendations for Other Services       Precautions / Restrictions Precautions Precautions: Fall Precaution Comments: incontinent Restrictions Weight Bearing Restrictions: No      Mobility  Bed Mobility Overal bed mobility: Needs Assistance Bed Mobility: Supine to Sit     Supine to sit: Supervision     General bed mobility comments: Cues provided. Increased time.    Transfers Overall transfer level: Needs assistance Equipment used: Rolling walker (2 wheeled) Transfers: Sit to/from [facility] Sit to Stand: Supervision Stand pivot transfers: Supervision       General transfer comment: Supv for safety. Increased time.  Ambulation/Gait Ambulation/Gait assistance: Min guard Gait Distance (Feet): 75 Feet Assistive device: Rolling walker (2 wheeled) Gait Pattern/deviations: Step-through pattern;Decreased stride length     General Gait Details: Min guard for safety. No LOB with RW use. Cues for safety. Pt denied lightheadedness. Dyspnea 2/4.  Stairs            Wheelchair Mobility    Modified Rankin (Stroke Patients Only)       Balance Overall balance  assessment: Needs assistance         Standing balance support: Bilateral upper extremity supported Standing balance-Leahy Scale: Fair                               Pertinent Vitals/Pain Pain Assessment: No/denies pain    Home Living Family/patient expects to be discharged to:: Private residence Living Arrangements: Spouse/significant other Available Help at Discharge: Family Type of Home: House Home Access: Ramped entrance     Home Layout: One level Home Equipment: Environmental consultant - 4 wheels;Cane - single point      Prior Function Level of Independence: Independent with assistive device(s)         Comments: uses cane vs rollator     Hand Dominance        Extremity/Trunk Assessment   Upper Extremity Assessment Upper Extremity Assessment: Generalized weakness    Lower Extremity Assessment Lower Extremity Assessment: Generalized weakness    Cervical / Trunk Assessment Cervical / Trunk Assessment: Normal  Communication   Communication: No difficulties  Cognition Arousal/Alertness: Awake/alert Behavior During Therapy: WFL for tasks assessed/performed Overall Cognitive Status: History of cognitive impairments - at baseline                                        General Comments      Exercises     Assessment/Plan    PT Assessment Patient needs continued PT services  PT Problem List Decreased mobility;Decreased  strength;Decreased balance;Decreased cognition       PT Treatment Interventions DME instruction;Gait training;Therapeutic exercise;Balance training;Functional mobility training;Therapeutic activities;Patient/family education    PT Goals (Current goals can be found in the Care Plan section)  Acute Rehab PT Goals Patient Stated Goal: home soon PT Goal Formulation: With patient Time For Goal Achievement: 10/14/20 Potential to Achieve Goals: Good    Frequency Min 3X/week   Barriers to discharge        Co-evaluation                AM-PAC PT "6 Clicks" Mobility  Outcome Measure Help needed turning from your back to your side while in a flat bed without using bedrails?: None Help needed moving from lying on your back to sitting on the side of a flat bed without using bedrails?: None Help needed moving to and from a bed to a chair (including a wheelchair)?: A Theriault Help needed standing up from a chair using your arms (e.g., wheelchair or bedside chair)?: A Mihalko Help needed to walk in hospital room?: A [place] Help needed climbing 3-5 steps with a railing? : A Guido 6 Click Score: 20    End of Session   Activity Tolerance: Patient tolerated treatment well Patient left: in chair;with call bell/phone within reach  Made NT aware that pt was found sitting in stool/urine and that she may need some assistance washing up before putting on her personal clothing PT Visit Diagnosis: Muscle weakness (generalized) (M62.81)    Time: 2122-4825 PT Time Calculation (min) (ACUTE ONLY): 21 min   Charges:   PT Evaluation $PT Eval Moderate Complexity: 1 Mod             Doreatha Massed, PT Acute Rehabilitation  Office: [phone] Pager: [phone]

## 2020-10-01 ENCOUNTER — Telehealth: Payer: Self-pay | Admitting: Endocrinology

## 2020-10-01 NOTE — Telephone Encounter (Signed)
Please advise 

## 2020-10-01 NOTE — Telephone Encounter (Signed)
I called pharmacy back. Stated patient had been discharged no longer needed help.

## 2020-10-01 NOTE — Telephone Encounter (Signed)
Pt niece states that pt just recently got out of the hospital. 09/30/2020. Pt niece states that the hospital told pt to stop taking HUMALOG MIX 75/25 KWIKPEN (75-25) 100 UNIT/ML Kwikpen as well as tresiba   Pt's niece would like a call back from the nurse as soon as possible to better understand about what patient should do.

## 2020-10-02 NOTE — Telephone Encounter (Signed)
Pt's niece is calling back wanting to know what to do. States that they need to be able to give her something if needed. At 12:02pm oo 10/02/2020 blood sugar is 150.   Niece needs to know what exactly she needs to be taking.  [phone]

## 2020-10-02 NOTE — Telephone Encounter (Signed)
Called and informed patient of changes to Humalog. [place] office scheduled patient for tomorrow.

## 2020-10-03 ENCOUNTER — Ambulatory Visit (INDEPENDENT_AMBULATORY_CARE_PROVIDER_SITE_OTHER): Payer: Medicare Other | Admitting: Endocrinology

## 2020-10-03 ENCOUNTER — Other Ambulatory Visit: Payer: Self-pay

## 2020-10-03 ENCOUNTER — Encounter: Payer: Self-pay | Admitting: Endocrinology

## 2020-10-03 VITALS — BP 132/82 | HR 54 | Ht 63.0 in | Wt 141.8 lb

## 2020-10-03 DIAGNOSIS — Z794 Long term (current) use of insulin: Secondary | ICD-10-CM

## 2020-10-03 DIAGNOSIS — E1165 Type 2 diabetes mellitus with hyperglycemia: Secondary | ICD-10-CM

## 2020-10-03 MED ORDER — INSULIN LISPRO (1 UNIT DIAL) 100 UNIT/ML (KWIKPEN): PEN_INJECTOR | SUBCUTANEOUS | 1 refills | Status: DC

## 2020-10-03 NOTE — Progress Notes (Signed)
Patient ID: [name], female   DOB: November 15, 1940, 80 y.o.   MRN: [id number]           Reason for Appointment: Follow-up for Type 2 Diabetes  Referring PCP: Koleen Distance   History of Present Illness:          Date of diagnosis of type 2 diabetes mellitus: 1988       Background history:   Patient has been on insulin for a few years and she is unclear when this was started, according to Epic she was started on Levemir in 2015 Prior to that likely had been on various oral hypoglycemic agents including metformin, glipizide and Tradjenta Her level of control has been somewhat variable but not consistently poor; her A1c has ranged between 5.6 and 7.6  Recent history:   Her A1c is improving at 6.7, was 7.1 Previous fructosamine 256  INSULIN regimen is: Humalog mix 12 units before breakfast and Humalog 5 units before dinner    Non-insulin hypoglycemic drugs prescribed: Trulicity 1.5 mg weekly  Current management, blood sugar patterns and problems identified: She is being seen for posthospital follow-up  Apparently her blood sugar was 40 when she was admitted but not clear why she was hypoglycemic  Prior to her admission she had occasionally low blood sugars late in the evening but not consistently  She was recommended taking only 10 units of Humalog mix once a day but her husband is giving her the same doses as before admission  Blood sugar since she has been home have ranged between 102-197  Recent fasting blood sugars range between 105-166 Highest blood sugar was 197 today before eating but she had some pudding during the night Generally blood sugars appear to be low normal or slightly low at bedtime Overall monitoring has been inadequate   She has been regular with Trulicity which she takes every Tuesday         Side effects from medications have been: None known     Meal times are:  Breakfast is at variable times between 8 AM-9  Dinner 7 pm  Typical meal intake: Breakfast is  Cheerios or raisin Bran               Exercise:  Unable to  Glucose monitoring:  Checking up to 6 times a day       Glucometer: Accu-Chek    Blood Glucose readings with readings for the last week and averages for the last 30 days   PRE-MEAL Fasting Lunch Dinner Bedtime Overall  Glucose range:     48-314  Mean/median: 99   141 127+/- 54    Previously:  PRE-MEAL Fasting Lunch Dinner Bedtime Overall  Glucose range: 73-259    51-475  Mean/median: 180 165 248 205 190      Dietician visit, most recent: 11/2018  Weight history:  Wt Readings from Last 3 Encounters:  10/03/20 141 lb 12.8 oz (64.3 kg)  09/30/20 150 lb 5.7 oz (68.2 kg)  09/04/20 151 lb 6.4 oz (68.7 kg)    Glycemic control:   Lab Results  Component Value Date   HGBA1C 6.7 (A) 08/28/2020   HGBA1C 7.1 (A) 04/25/2020   HGBA1C 7.5 (A) 02/01/2020   Lab Results  Component Value Date   MICROALBUR 1,289.4 (H) 03/07/2019   LDLCALC 57 01/30/2019   CREATININE 1.01 (H) 09/30/2020   Lab Results  Component Value Date   MICRALBCREAT 5 03/28/2018    Lab Results  Component Value Date  FRUCTOSAMINE 404 (H) 10/31/2019   FRUCTOSAMINE 256 06/26/2019    Admission on 09/29/2020, Discharged on 09/30/2020  Component Date Value Ref Range Status   Sodium 09/29/2020 141  135 - 145 mmol/L Final   Potassium 09/29/2020 2.6 (A) 3.5 - 5.1 mmol/L Final   Comment: CRITICAL RESULT CALLED TO, READ BACK BY AND VERIFIED WITH: HARRIS K RN ON 09/29/2020 @ 0333 BY MECIALJ J.    Chloride 09/29/2020 102  98 - 111 mmol/L Final   CO2 09/29/2020 29  22 - 32 mmol/L Final   Glucose, Bld 09/29/2020 41 (A) 70 - 99 mg/dL Final   Comment: Glucose reference range applies only to samples taken after fasting for at least 8 hours. CRITICAL RESULT CALLED TO, READ BACK BY AND VERIFIED WITH: HARRIS K RN ON 09/29/2020 @ 0333 BY MECIALJ J.    BUN 09/29/2020 10  8 - 23 mg/dL Final   Creatinine, Ser 09/29/2020 1.07 (A) 0.44 - 1.00 mg/dL Final    Calcium 09/29/2020 8.9  8.9 - 10.3 mg/dL Final   Total Protein 09/29/2020 6.1 (A) 6.5 - 8.1 g/dL Final   Albumin 09/29/2020 3.1 (A) 3.5 - 5.0 g/dL Final   AST 09/29/2020 33  15 - 41 U/L Final   ALT 09/29/2020 15  0 - 44 U/L Final   Alkaline Phosphatase 09/29/2020 151 (A) 38 - 126 U/L Final   Total Bilirubin 09/29/2020 1.0  0.3 - 1.2 mg/dL Final   GFR, Estimated 09/29/2020 53 (A) >60 mL/min Final   Comment: (NOTE) Calculated using the CKD-EPI Creatinine Equation (2021)    Anion gap 09/29/2020 10  5 - 15 Final   Performed at [facility], [place] [address] [place], [place] [address]   B Natriuretic Peptide 09/29/2020 208.3 (A) 0.0 - 100.0 pg/mL Final   Performed at [place] [address], [place], [place] [address]   WBC 09/29/2020 8.6  4.0 - 10.5 K/uL Final   RBC 09/29/2020 3.58 (A) 3.87 - 5.11 MIL/uL Final   Hemoglobin 09/29/2020 10.4 (A) 12.0 - 15.0 g/dL Final   HCT 09/29/2020 31.8 (A) 36.0 - 46.0 % Final   MCV 09/29/2020 88.8  80.0 - 100.0 fL Final   MCH 09/29/2020 29.1  26.0 - 34.0 pg Final   MCHC 09/29/2020 32.7  30.0 - 36.0 g/dL Final   RDW 09/29/2020 14.6  11.5 - 15.5 % Final   Platelets 09/29/2020 199  150 - 400 K/uL Final   nRBC 09/29/2020 0.0  0.0 - 0.2 % Final   Neutrophils Relative % 09/29/2020 67  % Final   Neutro Abs 09/29/2020 5.8  1.7 - 7.7 K/uL Final   Lymphocytes Relative 09/29/2020 16  % Final   Lymphs Abs 09/29/2020 1.3  0.7 - 4.0 K/uL Final   Monocytes Relative 09/29/2020 10  % Final   Monocytes Absolute 09/29/2020 0.9  0.1 - 1.0 K/uL Final   Eosinophils Relative 09/29/2020 5  % Final   Eosinophils Absolute 09/29/2020 0.5  0.0 - 0.5 K/uL Final   Basophils Relative 09/29/2020 1  % Final   Basophils Absolute 09/29/2020 0.0  0.0 - 0.1 K/uL Final   Immature Granulocytes 09/29/2020 1  % Final   Abs Immature Granulocytes 09/29/2020 0.08 (A) 0.00 - 0.07 K/uL Final   Performed at [facility], [place]  [address] [place], [place] [address]   SARS Coronavirus 2 by RT PCR 09/29/2020 NEGATIVE  NEGATIVE Final   Comment: (NOTE) SARS-CoV-2 target nucleic acids are  NOT DETECTED.  The SARS-CoV-2 RNA is generally detectable in upper respiratory specimens during the acute phase of infection. The lowest concentration of SARS-CoV-2 viral copies this assay can detect is 138 copies/mL. A negative result does not preclude SARS-Cov-2 infection and should not be used as the sole basis for treatment or other patient management decisions. A negative result may occur with  improper specimen collection/handling, submission of specimen other than nasopharyngeal swab, presence of viral mutation(s) within the areas targeted by this assay, and inadequate number of viral copies(<138 copies/mL). A negative result must be combined with clinical observations, patient history, and epidemiological information. The expected result is Negative.  Fact Sheet for Patients:  EntrepreneurPulse.com.au  Fact Sheet for Healthcare Providers:  IncredibleEmployment.be  This test is no                          t yet approved or cleared by the [place] FDA and  has been authorized for detection and/or diagnosis of SARS-CoV-2 by FDA under an Emergency Use Authorization (EUA). This EUA will remain  in effect (meaning this test can be used) for the duration of the COVID-19 declaration under Section 564(b)(1) of the Act, 21 U.S.C.section 360bbb-3(b)(1), unless the authorization is terminated  or revoked sooner.       Influenza A by PCR 09/29/2020 NEGATIVE  NEGATIVE Final   Influenza B by PCR 09/29/2020 NEGATIVE  NEGATIVE Final   Comment: (NOTE) The Xpert Xpress SARS-CoV-2/FLU/RSV plus assay is intended as an aid in the diagnosis of influenza from Nasopharyngeal swab specimens and should not be used as a sole basis for treatment. Nasal washings and aspirates are unacceptable for Xpert  Xpress SARS-CoV-2/FLU/RSV testing.  Fact Sheet for Patients: EntrepreneurPulse.com.au  Fact Sheet for Healthcare Providers: IncredibleEmployment.be  This test is not yet approved or cleared by the [place] FDA and has been authorized for detection and/or diagnosis of SARS-CoV-2 by FDA under an Emergency Use Authorization (EUA). This EUA will remain in effect (meaning this test can be used) for the duration of the COVID-19 declaration under Section 564(b)(1) of the Act, 21 U.S.C. section 360bbb-3(b)(1), unless the authorization is terminated or revoked.  Performed at [facility], [place] [address] [place], [place] [address]    Glucose-Capillary 09/29/2020 43 (A) 70 - 99 mg/dL Final   Glucose reference range applies only to samples taken after fasting for at least 8 hours.   Comment 1 09/29/2020 Document in Chart   Final   Magnesium 09/29/2020 1.7  1.7 - 2.4 mg/dL Final   Performed at [facility], [place] [address], [place], [place] [address]   Weight 09/29/2020 2,515.01  oz Final   BP 09/29/2020 171/64  mmHg Final   S' Lateral 09/29/2020 2.90  cm Final   AR max vel 09/29/2020 1.25  cm2 Final   AV Area VTI 09/29/2020 1.18  cm2 Final   AV Mean grad 09/29/2020 13.6  mmHg Final   AV Peak grad 09/29/2020 28.6  mmHg Final   Ao pk vel 09/29/2020 2.67  m/s Final   Area-P 1/2 09/29/2020 3.54  cm2 Final   AV Area mean vel 09/29/2020 1.31  cm2 Final   Sodium 09/29/2020 140  135 - 145 mmol/L Final   Potassium 09/29/2020 3.2 (A) 3.5 - 5.1 mmol/L Final   DELTA CHECK NOTED   Chloride 09/29/2020 99  98 - 111 mmol/L Final   CO2 09/29/2020 30  22 - 32 mmol/L  Final   Glucose, Bld 09/29/2020 221 (A) 70 - 99 mg/dL Final   Glucose reference range applies only to samples taken after fasting for at least 8 hours.   BUN 09/29/2020 10  8 - 23 mg/dL Final   Creatinine, Ser 09/29/2020 1.13 (A) 0.44 - 1.00 mg/dL Final   Calcium  09/29/2020 9.1  8.9 - 10.3 mg/dL Final   GFR, Estimated 09/29/2020 49 (A) >60 mL/min Final   Comment: (NOTE) Calculated using the CKD-EPI Creatinine Equation (2021)    Anion gap 09/29/2020 11  5 - 15 Final   Performed at [facility], [place] [address], [place], [place] [address]   Glucose-Capillary 09/29/2020 192 (A) 70 - 99 mg/dL Final   Glucose reference range applies only to samples taken after fasting for at least 8 hours.   Glucose-Capillary 09/29/2020 188 (A) 70 - 99 mg/dL Final   Glucose reference range applies only to samples taken after fasting for at least 8 hours.   Glucose-Capillary 09/29/2020 223 (A) 70 - 99 mg/dL Final   Glucose reference range applies only to samples taken after fasting for at least 8 hours.   Glucose-Capillary 09/29/2020 191 (A) 70 - 99 mg/dL Final   Glucose reference range applies only to samples taken after fasting for at least 8 hours.   Sodium 09/30/2020 141  135 - 145 mmol/L Final   Potassium 09/30/2020 4.1  3.5 - 5.1 mmol/L Final   Comment: NO VISIBLE HEMOLYSIS DELTA CHECK NOTED    Chloride 09/30/2020 106  98 - 111 mmol/L Final   CO2 09/30/2020 29  22 - 32 mmol/L Final   Glucose, Bld 09/30/2020 109 (A) 70 - 99 mg/dL Final   Glucose reference range applies only to samples taken after fasting for at least 8 hours.   BUN 09/30/2020 9  8 - 23 mg/dL Final   Creatinine, Ser 09/30/2020 1.01 (A) 0.44 - 1.00 mg/dL Final   Calcium 09/30/2020 9.1  8.9 - 10.3 mg/dL Final   GFR, Estimated 09/30/2020 56 (A) >60 mL/min Final   Comment: (NOTE) Calculated using the CKD-EPI Creatinine Equation (2021)    Anion gap 09/30/2020 6  5 - 15 Final   Performed at [facility], [place] [address] [place], [place] [address]   Glucose-Capillary 09/29/2020 174 (A) 70 - 99 mg/dL Final   Glucose reference range applies only to samples taken after fasting for at least 8 hours.   Glucose-Capillary 09/29/2020 134 (A) 70 - 99 mg/dL Final    Glucose reference range applies only to samples taken after fasting for at least 8 hours.   Glucose-Capillary 09/30/2020 102 (A) 70 - 99 mg/dL Final   Glucose reference range applies only to samples taken after fasting for at least 8 hours.   Glucose-Capillary 09/30/2020 96  70 - 99 mg/dL Final   Glucose reference range applies only to samples taken after fasting for at least 8 hours.   Glucose-Capillary 09/30/2020 118 (A) 70 - 99 mg/dL Final   Glucose reference range applies only to samples taken after fasting for at least 8 hours.    Allergies as of 10/03/2020       Reactions   Aspirin Nausea And Vomiting   Other reaction(s): Other (See Comments)        Medication List        Accurate as of October 03, 2020  8:57 PM. If you have any questions, ask your nurse or doctor.  Accu-Chek Guide test strip Generic drug: glucose blood USE AS INSTRUCTED TO CHECK BLOOD SUGAR 3 TIMES DAILY.   Accu-Chek Guide test strip Generic drug: glucose blood USE AS INSTRUCTED TO CHECK BLOOD SUGAR 3 TIMES DAILY.   Accu-Chek Softclix Lancets lancets 1 each by Other route 3 (three) times daily. Use as instructed to check blood sugar 3 times per day dx code E11.65   Lancets Misc 1 each by Other route.   amLODipine 10 MG tablet Commonly known as: NORVASC Take 1 tablet (10 mg total) by mouth daily.   B-D UF III MINI PEN NEEDLES 31G X 5 MM Misc Generic drug: Insulin Pen Needle USE TO INJECT INSULIN 3 TIMES A DAY   donepezil 10 MG tablet Commonly known as: ARICEPT TAKE 1 TABLET BY MOUTH AT BEDTIME   FreeStyle Libre 2 Sensor Misc 2 Devices by Does not apply route every 14 (fourteen) days.   gabapentin 100 MG capsule Commonly known as: NEURONTIN TAKE 1 CAPSULE BY MOUTH TWICE DAILY   HumaLOG Mix 75/25 KwikPen (75-25) 100 UNIT/ML Kwikpen Generic drug: Insulin Lispro Prot & Lispro INJECT 10 UNITS SUBCUTANEOUSLY ONCE DAILY   insulin lispro 100 UNIT/ML KwikPen Commonly known as:  HumaLOG KwikPen 3 units before dinner Started by: Elayne Snare, MD   levETIRAcetam 500 MG tablet Commonly known as: KEPPRA TAKE 1 TABLET BY MOUTH TWICE DAILY   Medical Compression Stockings Misc Wear as much as possible while awake to reduce swelling   memantine 10 MG tablet Commonly known as: NAMENDA Take 1 tablet (10 mg total) by mouth 2 (two) times daily.   olmesartan 20 MG tablet Commonly known as: BENICAR Take 10 mg by mouth daily.   Potassium Chloride ER 20 MEQ Tbcr Take 20 mEq by mouth 2 (two) times daily.   torsemide 20 MG tablet Commonly known as: DEMADEX Take 1 tablet (20 mg total) by mouth daily.   triamcinolone ointment 0.5 % Commonly known as: KENALOG APPLY 1 APPLICATION TOPICALLY TO RASH TWICE DAILY FOR ITCHING   Trulicity 1.5 YJ/8.5UD Sopn Generic drug: Dulaglutide INJECT 1.5MG  SUBCUTANEOUSLY EVERY WEEK ON THURSDAY        Allergies:  Allergies  Allergen Reactions   Aspirin Nausea And Vomiting    Other reaction(s): Other (See Comments)    Past Medical History:  Diagnosis Date   Chronic anemia    Chronic diastolic CHF (congestive heart failure) ([place]) 10/03/2013   Chronic GI bleeding    /notes 11/29/2014   Family history of anesthesia complication    "niece has a hard time coming out" (09/15/2012)   Frequent nosebleeds    chronic   Gastric AV malformation    /notes 11/29/2014   GERD (gastroesophageal reflux disease)    Heart murmur 04/01/2017   Moderate AVSC on echo 09/2016   History of blood transfusion "several"   History of epistaxis    HTN (hypertension), benign 03/02/2012   Hyperlipidemia    Iron deficiency anemia    chronic infusions"   Lichen planus    Both lower extremities   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    Past Surgical History:  Procedure Laterality Date  CATARACT EXTRACTION     "I think it was just one eye"   ESOPHAGOGASTRODUODENOSCOPY  02/26/2011   Procedure: ESOPHAGOGASTRODUODENOSCOPY (EGD);  Surgeon: [name], MD;  Location: [name];  Service: Endoscopy;  Laterality: N/A;   ESOPHAGOGASTRODUODENOSCOPY N/A 11/08/2012   Procedure: ESOPHAGOGASTRODUODENOSCOPY (EGD);  Surgeon: [name], MD;  Location: [name];  Service: Endoscopy;  Laterality: N/A;   ESOPHAGOGASTRODUODENOSCOPY N/A 10/04/2013   Procedure: ESOPHAGOGASTRODUODENOSCOPY (EGD);  Surgeon: Winfield Cunas., MD;  Location: [name];  Service: Endoscopy;  Laterality: N/A;  with APC on stand-by   ESOPHAGOGASTRODUODENOSCOPY N/A 07/06/2014   Procedure: ESOPHAGOGASTRODUODENOSCOPY (EGD);  Surgeon: Clarene Essex, MD;  Location: [name];  Service: Endoscopy;  Laterality: N/A;   ESOPHAGOGASTRODUODENOSCOPY N/A 09/05/2014   Procedure: ESOPHAGOGASTRODUODENOSCOPY (EGD);  Surgeon: Laurence Spates, MD;  Location: [name];  Service: Endoscopy;  Laterality: N/A;  APC on standby to control bleeding   ESOPHAGOGASTRODUODENOSCOPY N/A 11/29/2014   Procedure: ESOPHAGOGASTRODUODENOSCOPY (EGD);  Surgeon: [name], MD;  Location: [facility] ENDOSCOPY;  Service: Endoscopy;  Laterality: N/A;   ESOPHAGOGASTRODUODENOSCOPY N/A 09/28/2015   Procedure: ESOPHAGOGASTRODUODENOSCOPY (EGD);  Surgeon: Clarene Essex, MD;  Location[facility] ENDOSCOPY;  Service: Endoscopy;  Laterality: N/A;   ESOPHAGOGASTRODUODENOSCOPY (EGD) WITH PROPOFOL N/A 12/04/2017   Procedure: ESOPHAGOGASTRODUODENOSCOPY (EGD) WITH PROPOFOL;  Surgeon: [name], MD;  Location: [facility];  Service: Endoscopy;  Laterality: N/A;   ESOPHAGOGASTRODUODENOSCOPY ENDOSCOPY  08/19/2006   with laser treatment   HOT HEMOSTASIS N/A 07/06/2014   Procedure: HOT HEMOSTASIS (ARGON PLASMA COAGULATION/BICAP);   Surgeon: Clarene Essex, MD;  Location: [name];  Service: Endoscopy;  Laterality: N/A;   HOT HEMOSTASIS N/A 09/28/2015   Procedure: HOT HEMOSTASIS (ARGON PLASMA COAGULATION/BICAP);  Surgeon: Clarene Essex, MD;  Location: [facility] ENDOSCOPY;  Service: Endoscopy;  Laterality: N/A;   HOT HEMOSTASIS N/A 12/04/2017   Procedure: HOT HEMOSTASIS (ARGON PLASMA COAGULATION/BICAP);  Surgeon: [name], MD;  Location: [facility];  Service: Endoscopy;  Laterality: N/A;   NASAL HEMORRHAGE CONTROL     "for bleeding"    SAVORY DILATION  02/26/2011   Procedure: SAVORY DILATION;  Surgeon: [name], MD;  Location: [name];  Service: Endoscopy;  Laterality: N/A;  c-arm needed   SUBMUCOSAL INJECTION  12/04/2017   Procedure: SUBMUCOSAL INJECTION;  Surgeon: [name], MD;  Location: [facility] ENDOSCOPY;  Service: Endoscopy;;    Family History  Problem Relation Age of Onset   Stroke Father    Healthy Mother    Breast cancer Other    Diabetes Niece    Malignant hyperthermia Neg Hx    Seizures Neg Hx     Social History:  reports that she quit smoking about 49 years ago. Her smoking use included cigarettes. She has a 20.00 pack-year smoking history. She has never used smokeless tobacco. She reports that she does not drink alcohol and does not use drugs.   Review of Systems   Lipid history: Not on a statin drug    Lab Results  Component Value Date   CHOL 133 01/30/2019   HDL 41.30 01/30/2019   LDLCALC 57 01/30/2019   TRIG 176.0 (H) 01/30/2019   CHOLHDL 3 01/30/2019           Hypertension: Treated with amlodipine and olmesartan  BP Readings from Last 3 Encounters:  10/03/20 132/82  09/30/20 (!) 146/79  09/04/20 (!) 166/62   Renal function history: Has CKD of unclear etiology  She is on Demadex prescribed by PCP   Lab Results  Component Value Date  CREATININE 1.01 (H) 09/30/2020   CREATININE 1.13 (H) 09/29/2020   CREATININE 1.07 (H) 09/29/2020   Lab Results  Component Value  Date   K 4.1 09/30/2020     Most recent eye exam was in 07/2018  Most recent foot exam: 06/2018 by podiatrist with no evidence of sensory loss or reduction in sensation   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, microalbuminuria     Physical Examination:  BP 132/82   Pulse (!) 54   Ht 5\' 3"  (1.6 m)   Wt 141 lb 12.8 oz (64.3 kg)   LMP  (LMP Unknown)   SpO2 97%   BMI 25.12 kg/m   No edema of lower legs present  ASSESSMENT:  Diabetes type 2 on insulin  See history of present illness for detailed discussion of current diabetes management, blood sugar patterns and problems identified  A1c is last 6.7, previously 7.1  She is currently on Trulicity 1.5 mg, Humalog 5 units at dinner and Humalog mix 12 units in the morning  Blood sugars in the last month have been better than before with average 127  Blood sugars are generally fairly good most of the time with significant variability after dinner and with both high and low readings  Previously had gained weight but now is leveled off with Trulicity However appears to be requiring less insulin overall despite improved renal function   PLAN:    She needs to take only 3 units of Humalog at dinnertime  Also in the morning as a precaution we will reduce her Humalog mix to 10 units instead of 12 Advised her husband to follow instructions given on this visit summary Continue Trulicity  Follow-up as scheduled  Patient Instructions  Clear Humalog only 3 at supper  Cloudy Humalog 10 units before breakfast daily   Total visit time including counseling = 30 minutes     Elayne Snare 10/03/2020, 8:57 PM   Note: This office note was prepared with Dragon voice recognition system technology. Any transcriptional errors that result from this process are unintentional.

## 2020-10-03 NOTE — Patient Instructions (Signed)
Clear Humalog only 3 at supper  Cloudy Humalog 10 units before breakfast daily

## 2020-10-04 ENCOUNTER — Other Ambulatory Visit: Payer: Self-pay | Admitting: Internal Medicine

## 2020-10-08 ENCOUNTER — Ambulatory Visit (INDEPENDENT_AMBULATORY_CARE_PROVIDER_SITE_OTHER): Payer: Medicare Other | Admitting: Internal Medicine

## 2020-10-08 ENCOUNTER — Encounter: Payer: Self-pay | Admitting: Internal Medicine

## 2020-10-08 DIAGNOSIS — R238 Other skin changes: Secondary | ICD-10-CM

## 2020-10-08 NOTE — Assessment & Plan Note (Signed)
Patient presents for evaluation of a approximately 1 cm bullous lesion on her right lateral lower extremity.  She noticed it arise several days ago but denies any pain, burning or itching.  Does not have any other lesions on any other parts of her body.  States this is never happened before.  Denies any fevers, chills, nausea vomiting, joint pain or other rashes.  She states that she is underlying ointment over the lesion but cannot recall what type of ointment she is using.  She has not had any changes to her medications, soaps, detergents or any exposure to wooded areas.  States that she has not really been outside to be exposed anything like poison ivy or poison oak.  Assessment/plan: Differential diagnosis includes acute edema blister versus arthropod bite versus stasis dermatitis.  Recommended continued observation and care for when the bullous lesion does burst.  Patient provided with some gauze and instructed to get larger Band-Aids to cover the lesion once it bursts to keep it protected and clean.  Patient to return if symptoms persist or worsen.

## 2020-10-08 NOTE — Patient Instructions (Signed)
I believe your blister is due to the swelling in your leg or a bug bite.  It will eventually burst and when this happens I want you to leave the overlying skin intact.  Go ahead and cover it with a bandage to keep the area protected and dry.  Change that bandage about once a day until it has healed.

## 2020-10-08 NOTE — Progress Notes (Signed)
   CC: Right leg blister  HPI:  [name] is a 80 y.o. female with a past medical history listed below presenting for evaluation of a blister on her right lower extremity. For details of today's visit and the status of his chronic medical issues please refer to the assessment and plan.   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   Review of Systems: Negative except as per assessment and plan  Physical Exam:  Vitals:   10/08/20 1555  BP: (!) 121/56  Pulse: (!) 59  Temp: 98 F (36.7 C)  TempSrc: Oral  SpO2: 99%  Height: 5\' 3"  (1.6 m)   Physical Exam General: alert, appears stated age, in no acute distress HEENT: Normocephalic, atraumatic, EOM intact, conjunctiva normal CV: Regular rate and rhythm, no murmurs rubs or gallops Pulm: Clear to auscultation bilaterally, normal work of breathing Abdomen: Soft, nondistended, bowel sounds present, no tenderness to palpation MSK: Bilateral lower extremity 2+ pitting edema Skin: Warm  and dry, approximately 1 cm bullous lesion on the right lateral lower extremity, no tenderness to palpation, negative Nikolsky sign Neuro: Alert and oriented x3     Assessment & Plan:   See Encounters Tab for problem based charting.  Patient discussed with Dr. Evette Doffing

## 2020-10-09 NOTE — Progress Notes (Signed)
Internal Medicine Clinic Attending ° °Case discussed with Dr. [name]  At the time of the visit.  We reviewed the resident’s history and exam and pertinent patient test results.  I agree with the assessment, diagnosis, and plan of care documented in the resident’s note.  ° °

## 2020-10-16 ENCOUNTER — Other Ambulatory Visit: Payer: Self-pay

## 2020-10-16 ENCOUNTER — Inpatient Hospital Stay: Payer: Medicare Other | Attending: Hematology

## 2020-10-16 ENCOUNTER — Other Ambulatory Visit: Payer: Medicare Other

## 2020-10-16 ENCOUNTER — Ambulatory Visit: Payer: Medicare Other

## 2020-10-16 ENCOUNTER — Inpatient Hospital Stay: Payer: Medicare Other

## 2020-10-16 VITALS — BP 133/49 | HR 52 | Temp 98.0°F | Resp 18

## 2020-10-16 DIAGNOSIS — I780 Hereditary hemorrhagic telangiectasia: Secondary | ICD-10-CM | POA: Diagnosis present

## 2020-10-16 DIAGNOSIS — D500 Iron deficiency anemia secondary to blood loss (chronic): Secondary | ICD-10-CM

## 2020-10-16 DIAGNOSIS — D508 Other iron deficiency anemias: Secondary | ICD-10-CM | POA: Diagnosis present

## 2020-10-16 DIAGNOSIS — Z95828 Presence of other vascular implants and grafts: Secondary | ICD-10-CM

## 2020-10-16 DIAGNOSIS — K31819 Angiodysplasia of stomach and duodenum without bleeding: Secondary | ICD-10-CM

## 2020-10-16 LAB — CBC WITH DIFFERENTIAL (CANCER CENTER ONLY)
Abs Immature Granulocytes: 0.01 10*3/uL (ref 0.00–0.07)
Basophils Absolute: 0 10*3/uL (ref 0.0–0.1)
Basophils Relative: 1 %
Eosinophils Absolute: 0.5 10*3/uL (ref 0.0–0.5)
Eosinophils Relative: 7 %
HCT: 38.4 % (ref 36.0–46.0)
Hemoglobin: 12.3 g/dL (ref 12.0–15.0)
Immature Granulocytes: 0 %
Lymphocytes Relative: 24 %
Lymphs Abs: 1.5 10*3/uL (ref 0.7–4.0)
MCH: 28.4 pg (ref 26.0–34.0)
MCHC: 32 g/dL (ref 30.0–36.0)
MCV: 88.7 fL (ref 80.0–100.0)
Monocytes Absolute: 0.6 10*3/uL (ref 0.1–1.0)
Monocytes Relative: 10 %
Neutro Abs: 3.7 10*3/uL (ref 1.7–7.7)
Neutrophils Relative %: 58 %
Platelet Count: 148 10*3/uL — ABNORMAL LOW (ref 150–400)
RBC: 4.33 MIL/uL (ref 3.87–5.11)
RDW: 14.1 % (ref 11.5–15.5)
WBC Count: 6.3 10*3/uL (ref 4.0–10.5)
nRBC: 0 % (ref 0.0–0.2)

## 2020-10-16 LAB — CMP (CANCER CENTER ONLY)
ALT: 13 U/L (ref 0–44)
AST: 23 U/L (ref 15–41)
Albumin: 3.1 g/dL — ABNORMAL LOW (ref 3.5–5.0)
Alkaline Phosphatase: 160 U/L — ABNORMAL HIGH (ref 38–126)
Anion gap: 9 (ref 5–15)
BUN: 20 mg/dL (ref 8–23)
CO2: 28 mmol/L (ref 22–32)
Calcium: 9.9 mg/dL (ref 8.9–10.3)
Chloride: 111 mmol/L (ref 98–111)
Creatinine: 2.04 mg/dL — ABNORMAL HIGH (ref 0.44–1.00)
GFR, Estimated: 24 mL/min — ABNORMAL LOW (ref >60)
Glucose, Bld: 56 mg/dL — ABNORMAL LOW (ref 70–99)
Potassium: 4.2 mmol/L (ref 3.5–5.1)
Sodium: 148 mmol/L — ABNORMAL HIGH (ref 135–145)
Total Bilirubin: 0.7 mg/dL (ref 0.3–1.2)
Total Protein: 6.6 g/dL (ref 6.5–8.1)

## 2020-10-16 LAB — FERRITIN: Ferritin: 26 ng/mL (ref 11–307)

## 2020-10-16 MED ORDER — SODIUM CHLORIDE 0.9 % IV SOLN
4.6000 mg/kg | Freq: Once | INTRAVENOUS | Status: AC
  Administered 2020-10-16: 300 mg via INTRAVENOUS
  Filled 2020-10-16: qty 12

## 2020-10-16 MED ORDER — SODIUM CHLORIDE 0.9% FLUSH
10.0000 mL | INTRAVENOUS | Status: DC | PRN
  Administered 2020-10-16: 10 mL

## 2020-10-16 MED ORDER — HEPARIN SOD (PORK) LOCK FLUSH 100 UNIT/ML IV SOLN
500.0000 [IU] | Freq: Once | INTRAVENOUS | Status: AC | PRN
  Administered 2020-10-16: 500 [IU]

## 2020-10-16 MED ORDER — SODIUM CHLORIDE 0.9 % IV SOLN: Freq: Once | INTRAVENOUS | Status: AC

## 2020-10-16 NOTE — Progress Notes (Signed)
Patient accidentally messed for urine collection for protein today. Dr. Burr Medico made aware, okay to skip collection this tx.

## 2020-10-16 NOTE — Patient Instructions (Signed)
[facility]  Discharge Instructions: Thank you for choosing [facility] to provide your oncology and hematology care.   If you have a lab appointment with the Cancer Center, please go directly to the Cancer Center and check in at the registration area.   Wear comfortable clothing and clothing appropriate for easy access to any Portacath or PICC line.   We strive to give you quality time with your provider. You may need to reschedule your appointment if you arrive late (15 or more minutes).  Arriving late affects you and other patients whose appointments are after yours.  Also, if you miss three or more appointments without notifying the office, you may be dismissed from the clinic at the provider's discretion.      For prescription refill requests, have your pharmacy contact our office and allow 72 hours for refills to be completed.    Today you received the following chemotherapy and/or immunotherapy agents Avastin       To help prevent nausea and vomiting after your treatment, we encourage you to take your nausea medication as directed.  BELOW ARE SYMPTOMS THAT SHOULD BE REPORTED IMMEDIATELY: *FEVER GREATER THAN 100.4 F (38 C) OR HIGHER *CHILLS OR SWEATING *NAUSEA AND VOMITING THAT IS NOT CONTROLLED WITH YOUR NAUSEA MEDICATION *UNUSUAL SHORTNESS OF BREATH *UNUSUAL BRUISING OR BLEEDING *URINARY PROBLEMS (pain or burning when urinating, or frequent urination) *BOWEL PROBLEMS (unusual diarrhea, constipation, pain near the anus) TENDERNESS IN MOUTH AND THROAT WITH OR WITHOUT PRESENCE OF ULCERS (sore throat, sores in mouth, or a toothache) UNUSUAL RASH, SWELLING OR PAIN  UNUSUAL VAGINAL DISCHARGE OR ITCHING   Items with * indicate a potential emergency and should be followed up as soon as possible or go to the Emergency Department if any problems should occur.  Please show the CHEMOTHERAPY ALERT CARD or IMMUNOTHERAPY ALERT CARD at check-in to  the Emergency Department and triage nurse.  Should you have questions after your visit or need to cancel or reschedule your appointment, please contact [facility]  Dept: [phone]  and follow the prompts.  Office hours are 8:00 a.m. to 4:30 p.m. Monday - Friday. Please note that voicemails left after 4:00 p.m. may not be returned until the following business day.  We are closed weekends and major holidays. You have access to a nurse at all times for urgent questions. Please call the main number to the clinic Dept: [phone] and follow the prompts.   For any non-urgent questions, you may also contact your provider using MyChart. We now offer e-Visits for anyone 18 and older to request care online for non-urgent symptoms. For details visit [email or web address].   Also download the MyChart app! Go to the app store, search "MyChart", open the app, select [facility], and log in with your MyChart username and password.  Due to Covid, a mask is required upon entering the hospital/clinic. If you do not have a mask, one will be given to you upon arrival. For doctor visits, patients may have 1 support person aged 18 or older with them. For treatment visits, patients cannot have anyone with them due to current Covid guidelines and our immunocompromised population.   

## 2020-10-22 ENCOUNTER — Telehealth: Payer: Self-pay | Admitting: Nurse Practitioner

## 2020-10-22 NOTE — Telephone Encounter (Signed)
Scheduled appt per sch msg. Called and spoke with patients niece. Confirmed appt

## 2020-10-25 ENCOUNTER — Other Ambulatory Visit (HOSPITAL_COMMUNITY): Payer: Self-pay

## 2020-11-04 ENCOUNTER — Inpatient Hospital Stay: Payer: Medicare Other

## 2020-11-04 ENCOUNTER — Other Ambulatory Visit: Payer: Self-pay

## 2020-11-04 VITALS — BP 122/54 | HR 55 | Temp 98.1°F | Resp 18

## 2020-11-04 DIAGNOSIS — I780 Hereditary hemorrhagic telangiectasia: Secondary | ICD-10-CM

## 2020-11-04 DIAGNOSIS — K31819 Angiodysplasia of stomach and duodenum without bleeding: Secondary | ICD-10-CM

## 2020-11-04 DIAGNOSIS — Z95828 Presence of other vascular implants and grafts: Secondary | ICD-10-CM

## 2020-11-04 DIAGNOSIS — D508 Other iron deficiency anemias: Secondary | ICD-10-CM

## 2020-11-04 DIAGNOSIS — D500 Iron deficiency anemia secondary to blood loss (chronic): Secondary | ICD-10-CM

## 2020-11-04 DIAGNOSIS — K31811 Angiodysplasia of stomach and duodenum with bleeding: Secondary | ICD-10-CM

## 2020-11-04 MED ORDER — SODIUM CHLORIDE 0.9% FLUSH
10.0000 mL | INTRAVENOUS | Status: DC | PRN
  Administered 2020-11-04: 10 mL

## 2020-11-04 MED ORDER — SODIUM CHLORIDE 0.9 % IV SOLN
Freq: Once | INTRAVENOUS | Status: AC
Start: 2020-11-04 — End: 2020-11-04

## 2020-11-04 MED ORDER — HEPARIN SOD (PORK) LOCK FLUSH 100 UNIT/ML IV SOLN
500.0000 [IU] | Freq: Once | INTRAVENOUS | Status: AC | PRN
  Administered 2020-11-04: 500 [IU]

## 2020-11-04 MED ORDER — SODIUM CHLORIDE 0.9 % IV SOLN
510.0000 mg | Freq: Once | INTRAVENOUS | Status: AC
  Administered 2020-11-04: 510 mg via INTRAVENOUS
  Filled 2020-11-04: qty 510

## 2020-11-04 NOTE — Progress Notes (Signed)
Patient was observed for 30 minutes after infusion. Vitals stable and patient in no distress upon leaving infusion room.

## 2020-11-04 NOTE — Patient Instructions (Signed)
Ferumoxytol Injection What is this medication? FERUMOXYTOL (FER ue MOX i tol) treats low levels of iron in your body (iron deficiency anemia). Iron is a mineral that plays an important role in making red blood cells, which carry oxygen from your lungs to the rest of your body. This medicine may be used for other purposes; ask your health care provider or pharmacist if you have questions. COMMON BRAND NAME(S): Feraheme What should I tell my care team before I take this medication? They need to know if you have any of these conditions: Anemia not caused by low iron levels High levels of iron in the blood Magnetic resonance imaging (MRI) test scheduled An unusual or allergic reaction to iron, other medications, foods, dyes, or preservatives Pregnant or trying to get pregnant Breast-feeding How should I use this medication? This medication is for injection into a vein. It is given in a hospital or clinic setting. Talk to your care team the use of this medication in children. Special care may be needed. Overdosage: If you think you have taken too much of this medicine contact a poison control center or emergency room at once. NOTE: This medicine is only for you. Do not share this medicine with others. What if I miss a dose? It is important not to miss your dose. Call your care team if you are unable to keep an appointment. What may interact with this medication? Other iron products This list may not describe all possible interactions. Give your health care provider a list of all the medicines, herbs, non-prescription drugs, or dietary supplements you use. Also tell them if you smoke, drink alcohol, or use illegal drugs. Some items may interact with your medicine. What should I watch for while using this medication? Visit your care team regularly. Tell your care team if your symptoms do not start to get better or if they get worse. You may need blood work done while you are taking this  medication. You may need to follow a special diet. Talk to your care team. Foods that contain iron include: whole grains/cereals, dried fruits, beans, or peas, leafy green vegetables, and organ meats (liver, kidney). What side effects may I notice from receiving this medication? Side effects that you should report to your care team as soon as possible: Allergic reactions-skin rash, itching, hives, swelling of the face, lips, tongue, or throat Low blood pressure-dizziness, feeling faint or lightheaded, blurry vision Shortness of breath Side effects that usually do not require medical attention (report to your care team if they continue or are bothersome): Flushing Headache Joint pain Muscle pain Nausea Pain, redness, or irritation at injection site This list may not describe all possible side effects. Call your doctor for medical advice about side effects. You may report side effects to FDA at 1-800-FDA-1088. Where should I keep my medication? This medication is given in a hospital or clinic and will not be stored at home. NOTE: This sheet is a summary. It may not cover all possible information. If you have questions about this medicine, talk to your doctor, pharmacist, or health care provider.  2022 Elsevier/Gold Standard (2020-06-14 15:35:12)  

## 2020-11-08 ENCOUNTER — Other Ambulatory Visit: Payer: Self-pay | Admitting: Internal Medicine

## 2020-11-26 NOTE — Progress Notes (Deleted)
[facility]   [phone] [phone]   Clinic Follow up Note   Patient Care Team: [name], Charlsie Quest, DO as PCP - General (Internal Medicine) [name], MD as PCP - Cardiology (Cardiology) Truitt Merle, MD as Consulting Physician (Hematology) Elayne Snare, MD as Consulting Physician (Endocrinology)  Date of Service:  11/27/2020  CHIEF COMPLAINT: f/u of Anemia due to chronic blood loss, HHT  HEMATOLOGY HISTORY 1. Hereditary hemorrhagic telangiectasia (HHT), with epistaxis and frequent GI bleeding. Previously under care of Dr. [name] 2. Anemia secondary to GI bleeding and iron deficiency, required blood transfusion every 3 to 4 weeks in the past, IV Feraheme every 1 to 2 months  CURRENT THERAPY:   1. Iv feraheme as needed if ferritin<100, latest on 12/2019 2. Avastin 5mg /kg every 2 weeks, starting 05/09/18. Reduced to every 4 weeks starting 11/07/18. Reduced to every 2 months starting 01/02/19. Held 03/09/19-05/26/19 due to high proteinuria. Give Avastin every 6 weeks now  3. Blood transfusion if Hg<8.0  ASSESSMENT & PLAN:  ARADIA ESTEY is a 80 y.o. female with   1. Hereditary hemorrhagic telangiectasia (HHT)), with epistaxis and frequent GI bleeding -She receives blood transfusion as needed, but has not needed since 04/2018 when she started Beva. -She is on Avastin intermittently/PRN due to high proteinuria. -anemia has most resolved, she response well to Beva  -continue Beva every 6 weeks if urine protein <300  2. Iron deficiency anemia secondary to #1 -She receives IV Feraheme as needed for Ferritin <100, most recently 08/13/20  3. Dementia  -She takes Aricept and memantine. On Keppra for seizures.  -f/u with neurology.   5. DM, CHF, CKD stage III and HTN secondary to Avastin - followed by Dr. Dwyane Dee -She has some mild lower extremity swelling today. She is not aware of this being present before, nor is her husband. -I recommend they  monitor this  6. Proteinuria -This is likely secondary to avastin, but she also has diabetes  -She received bevacizumab as needed   Plan -lab reviewed, will proceed with Bevacizumab today, no need iv iron -iv feraheme if ferritin<100 -lab and beva in 6 and 12 weeks -f/u in 12 weeks -I spoke with her husband today. -Patient needs family member to come in with her in future, due to her dementia.   INTERVAL HISTORY:  [place] is here for a follow up of anemia and HHT.  She is here alone, concerned about her leg edema, which is chronic She also complains of abdominal pain, does not appear comfortable. I called her niece who brought her in, she said pt was fine at home, she did not eat before she came in. 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    SURGICAL HISTORY: Past Surgical History:  Procedure Laterality Date   CATARACT EXTRACTION      "I think it was just one eye"   ESOPHAGOGASTRODUODENOSCOPY  02/26/2011   Procedure: ESOPHAGOGASTRODUODENOSCOPY (EGD);  Surgeon: [name], MD;  Location: [name];  Service: Endoscopy;  Laterality: N/A;   ESOPHAGOGASTRODUODENOSCOPY N/A 11/08/2012   Procedure: ESOPHAGOGASTRODUODENOSCOPY (EGD);  Surgeon: [name], MD;  Location: [name];  Service: Endoscopy;  Laterality: N/A;   ESOPHAGOGASTRODUODENOSCOPY N/A 10/04/2013   Procedure: ESOPHAGOGASTRODUODENOSCOPY (EGD);  Surgeon: Winfield Cunas., MD;  Location: [name];  Service: Endoscopy;  Laterality: N/A;  with APC on stand-by   ESOPHAGOGASTRODUODENOSCOPY N/A 07/06/2014   Procedure: ESOPHAGOGASTRODUODENOSCOPY (EGD);  Surgeon: Clarene Essex, MD;  Location: [name];  Service: Endoscopy;  Laterality: N/A;   ESOPHAGOGASTRODUODENOSCOPY N/A 09/05/2014   Procedure: ESOPHAGOGASTRODUODENOSCOPY (EGD);  Surgeon: Laurence Spates, MD;  Location: [name];  Service: Endoscopy;  Laterality: N/A;  APC on standby to control bleeding   ESOPHAGOGASTRODUODENOSCOPY N/A 11/29/2014   Procedure: ESOPHAGOGASTRODUODENOSCOPY (EGD);  Surgeon: [name], MD;  [facility];  Service: Endoscopy;  Laterality: N/A;   ESOPHAGOGASTRODUODENOSCOPY N/A 09/28/2015   Procedure: ESOPHAGOGASTRODUODENOSCOPY (EGD);  Surgeon: Clarene Essex, MD;  [facility] END[facility]COPY;  Service: Endoscopy;  Laterality: N/A;   ESOPHAGOGASTRODUODENOSCOPY (EGD) WITH PROPOFOL N/A 12/04/2017   Procedure: ESOPHAGOGASTRODUODENOSCOPY (EGD) WITH PROPOFOL;  Surgeon: [name], MD;  Location: [facility];  Service: Endoscopy;  Laterality: N/A;   ESOPHAGOGASTRODUODENOSCOPY ENDOSCOPY  08/19/2006   with laser treatment   HOT HEMOSTASIS N/A 07/06/2014   Procedure: HOT HEMOSTASIS (ARGON PLASMA COAGULATION/BICAP);  Surgeon: Clarene Essex, MD;  Location: [name];  Service: Endoscopy;  Laterality: N/A;   HOT HEMOSTASIS N/A 09/28/2015   Procedure: HOT HEMOSTASIS (ARGON PLASMA  COAGULATION/BICAP);  Surgeon: Clarene Essex, MD;  Location: [facility] ENDOSCOPY;  Service: Endoscopy;  Laterality: N/A;   HOT HEMOSTASIS N/A 12/04/2017   Procedure: HOT HEMOSTASIS (ARGON PLASMA COAGULATION/BICAP);  Surgeon: [name], MD;  Location: [facility];  Service: Endoscopy;  Laterality: N/A;   NASAL HEMORRHAGE CONTROL     "for bleeding"    SAVORY DILATION  02/26/2011   Procedure: SAVORY DILATION;  Surgeon: [name], MD;  Location: [name];  Service: Endoscopy;  Laterality: N/A;  c-arm needed   SUBMUCOSAL INJECTION  12/04/2017   Procedure: SUBMUCOSAL INJECTION;  Surgeon: [name], MD;  Location: [facility] ENDOSCOPY;  Service: Endoscopy;;    I have reviewed the social history and family history with the patient and they are unchanged from previous note.  ALLERGIES:  is allergic to aspirin.  MEDICATIONS:  Current Outpatient Medications  Medication Sig Dispense Refill   amLODipine (NORVASC) 10 MG tablet TAKE 1 TABLET BY MOUTH EVERY MORNING 30 tablet 10   memantine (NAMENDA) 10 MG tablet TAKE 1 TABLET BY MOUTH TWICE DAILY 60 tablet 10   ACCU-CHEK GUIDE test strip USE AS INSTRUCTED TO CHECK BLOOD SUGAR 3 TIMES DAILY. 100 each 0   Accu-Chek Softclix Lancets lancets 1 each by Other route 3 (three) times  daily. Use as instructed to check blood sugar 3 times per day dx code E11.65 100 each 3   Continuous Blood Gluc Sensor (FREESTYLE LIBRE 2 SENSOR) MISC 2 Devices by Does not apply route every 14 (fourteen) days. 2 each 3   donepezil (ARICEPT) 10 MG tablet TAKE 1 TABLET BY MOUTH AT BEDTIME 30 tablet 8   Elastic Bandages & Supports (MEDICAL COMPRESSION STOCKINGS) MISC Wear as much as possible while awake to reduce swelling 2 each 0   gabapentin (NEURONTIN) 100 MG capsule TAKE 1 CAPSULE BY MOUTH TWICE DAILY 60 capsule 10   glucose blood (ACCU-CHEK GUIDE) test strip USE AS INSTRUCTED TO CHECK BLOOD SUGAR 3 TIMES DAILY.     HUMALOG MIX 75/25 KWIKPEN (75-25) 100 UNIT/ML Kwikpen INJECT 10  UNITS SUBCUTANEOUSLY ONCE DAILY 9 mL 10   insulin lispro (HUMALOG KWIKPEN) 100 UNIT/ML KwikPen 3 units before dinner 15 mL 1   Insulin Pen Needle (B-D UF III MINI PEN NEEDLES) 31G X 5 MM MISC USE TO INJECT INSULIN 3 TIMES A DAY 100 each 3   Lancets MISC 1 each by Other route.     levETIRAcetam (KEPPRA) 500 MG tablet TAKE 1 TABLET BY MOUTH TWICE DAILY 60 tablet 10   olmesartan (BENICAR) 20 MG tablet Take 10 mg by mouth daily.     Potassium Chloride ER 20 MEQ TBCR Take 20 mEq by mouth 2 (two) times daily. 60 tablet 5   torsemide (DEMADEX) 20 MG tablet Take 1 tablet (20 mg total) by mouth daily. 30 tablet 11   triamcinolone ointment (KENALOG) 0.5 % APPLY 1 APPLICATION TOPICALLY TO RASH TWICE DAILY FOR ITCHING 30 g 10   TRULICITY 1.5 VQ/2.5ZD SOPN INJECT 1.5MG  SUBCUTANEOUSLY EVERY WEEK ON THURSDAY 4 mL 10   No current facility-administered medications for this visit.   Facility-Administered Medications Ordered in Other Visits  Medication Dose Route Frequency Provider Last Rate Last Admin   sodium chloride 0.9 % injection 10 mL  10 mL Intracatheter PRN Truitt Merle, MD       sodium chloride flush (NS) 0.9 % injection 10 mL  10 mL Intracatheter PRN Truitt Merle, MD   10 mL at 08/18/19 1717    PHYSICAL EXAMINATION: ECOG PERFORMANCE STATUS: 2 - Symptomatic, <50% confined to bed  Vitals:   11/27/20 1158  BP: (!) 131/55  Pulse: (!) 53  Resp: 17  Temp: 98.5 F (36.9 C)  SpO2: 100%     Filed Weights   11/27/20 1158  Weight: 139 lb 6.4 oz (63.2 kg)      GENERAL:alert, in wheelchair, moderate distress, not able to sit still SKIN: skin color, texture, turgor are normal, no rashes or significant lesions EYES: normal, Conjunctiva are pink and non-injected, sclera clear  NECK: supple, thyroid normal size, non-tender, without nodularity LYMPH:  no palpable lymphadenopathy in the cervical, axillary  LUNGS: clear to auscultation and percussion with normal breathing effort HEART: regular rate &  rhythm and no murmurs; mild lower extremity edema present, tenderness ABDOMEN:abdomen soft, non-tender and normal bowel sounds Musculoskeletal:no cyanosis of digits and no clubbing  NEURO: alert & oriented x 3 with fluent speech, no focal motor/sensory deficits  LABORATORY DATA:  I have reviewed the data as listed CBC Latest Ref Rng & Units 11/27/2020 10/16/2020 09/29/2020  WBC 4.0 - 10.5 K/uL 4.6 6.3 8.6  Hemoglobin 12.0 - 15.0 g/dL 11.8(L) 12.3 10.4(L)  Hematocrit 36.0 - 46.0 % 35.0(L) 38.4 31.8(L)  Platelets 150 - 400 K/uL 127(L) 148(L) 199  CMP Latest Ref Rng & Units 11/27/2020 10/16/2020 09/30/2020  Glucose 70 - 99 mg/dL 195(H) 56(L) 109(H)  BUN 8 - 23 mg/dL 25(H) 20 9  Creatinine 0.44 - 1.00 mg/dL 2.06(H) 2.04(H) 1.01(H)  Sodium 135 - 145 mmol/L 146(H) 148(H) 141  Potassium 3.5 - 5.1 mmol/L 4.5 4.2 4.1  Chloride 98 - 111 mmol/L 111 111 106  CO2 22 - 32 mmol/L 23 28 29   Calcium 8.9 - 10.3 mg/dL 9.7 9.9 9.1  Total Protein 6.5 - 8.1 g/dL 6.9 6.6 -  Total Bilirubin 0.3 - 1.2 mg/dL 0.4 0.7 -  Alkaline Phos 38 - 126 U/L 167(H) 160(H) -  AST 15 - 41 U/L 31 23 -  ALT 0 - 44 U/L 29 13 -      RADIOGRAPHIC STUDIES: I have personally reviewed the radiological images as listed and agreed with the findings in the report. No results found.   No problem-specific Assessment & Plan notes found for this encounter.   No orders of the defined types were placed in this encounter.  All questions were answered. The patient knows to call the clinic with any problems, questions or concerns. No barriers to learning was detected. The total time spent in the appointment was 30 minutes.     Truitt Merle, MD 11/27/2020

## 2020-11-27 ENCOUNTER — Encounter: Payer: Self-pay | Admitting: Hematology

## 2020-11-27 ENCOUNTER — Inpatient Hospital Stay: Payer: Medicare Other

## 2020-11-27 ENCOUNTER — Other Ambulatory Visit: Payer: Self-pay

## 2020-11-27 ENCOUNTER — Inpatient Hospital Stay (HOSPITAL_BASED_OUTPATIENT_CLINIC_OR_DEPARTMENT_OTHER): Payer: Medicare Other | Admitting: Hematology

## 2020-11-27 ENCOUNTER — Inpatient Hospital Stay: Payer: Medicare Other | Attending: Hematology

## 2020-11-27 VITALS — BP 131/55 | HR 53 | Temp 98.5°F | Resp 17 | Ht 63.0 in | Wt 139.4 lb

## 2020-11-27 DIAGNOSIS — I780 Hereditary hemorrhagic telangiectasia: Secondary | ICD-10-CM | POA: Insufficient documentation

## 2020-11-27 DIAGNOSIS — I130 Hypertensive heart and chronic kidney disease with heart failure and stage 1 through stage 4 chronic kidney disease, or unspecified chronic kidney disease: Secondary | ICD-10-CM | POA: Diagnosis not present

## 2020-11-27 DIAGNOSIS — Z79899 Other long term (current) drug therapy: Secondary | ICD-10-CM | POA: Diagnosis not present

## 2020-11-27 DIAGNOSIS — D500 Iron deficiency anemia secondary to blood loss (chronic): Secondary | ICD-10-CM

## 2020-11-27 DIAGNOSIS — R809 Proteinuria, unspecified: Secondary | ICD-10-CM | POA: Diagnosis not present

## 2020-11-27 DIAGNOSIS — F0390 Unspecified dementia without behavioral disturbance: Secondary | ICD-10-CM | POA: Insufficient documentation

## 2020-11-27 DIAGNOSIS — M7989 Other specified soft tissue disorders: Secondary | ICD-10-CM | POA: Insufficient documentation

## 2020-11-27 DIAGNOSIS — N183 Chronic kidney disease, stage 3 unspecified: Secondary | ICD-10-CM | POA: Diagnosis not present

## 2020-11-27 DIAGNOSIS — Z5111 Encounter for antineoplastic chemotherapy: Secondary | ICD-10-CM | POA: Diagnosis not present

## 2020-11-27 DIAGNOSIS — K31819 Angiodysplasia of stomach and duodenum without bleeding: Secondary | ICD-10-CM

## 2020-11-27 DIAGNOSIS — Z95828 Presence of other vascular implants and grafts: Secondary | ICD-10-CM

## 2020-11-27 DIAGNOSIS — E1122 Type 2 diabetes mellitus with diabetic chronic kidney disease: Secondary | ICD-10-CM | POA: Diagnosis not present

## 2020-11-27 LAB — CMP (CANCER CENTER ONLY)
ALT: 29 U/L (ref 0–44)
AST: 31 U/L (ref 15–41)
Albumin: 3.3 g/dL — ABNORMAL LOW (ref 3.5–5.0)
Alkaline Phosphatase: 167 U/L — ABNORMAL HIGH (ref 38–126)
Anion gap: 12 (ref 5–15)
BUN: 25 mg/dL — ABNORMAL HIGH (ref 8–23)
CO2: 23 mmol/L (ref 22–32)
Calcium: 9.7 mg/dL (ref 8.9–10.3)
Chloride: 111 mmol/L (ref 98–111)
Creatinine: 2.06 mg/dL — ABNORMAL HIGH (ref 0.44–1.00)
GFR, Estimated: 24 mL/min — ABNORMAL LOW (ref >60)
Glucose, Bld: 195 mg/dL — ABNORMAL HIGH (ref 70–99)
Potassium: 4.5 mmol/L (ref 3.5–5.1)
Sodium: 146 mmol/L — ABNORMAL HIGH (ref 135–145)
Total Bilirubin: 0.4 mg/dL (ref 0.3–1.2)
Total Protein: 6.9 g/dL (ref 6.5–8.1)

## 2020-11-27 LAB — TOTAL PROTEIN, URINE DIPSTICK: Protein, ur: <30 mg/dL

## 2020-11-27 LAB — CBC WITH DIFFERENTIAL (CANCER CENTER ONLY)
Abs Immature Granulocytes: 0.01 10*3/uL (ref 0.00–0.07)
Basophils Absolute: 0 10*3/uL (ref 0.0–0.1)
Basophils Relative: 1 %
Eosinophils Absolute: 0.4 10*3/uL (ref 0.0–0.5)
Eosinophils Relative: 8 %
HCT: 35 % — ABNORMAL LOW (ref 36.0–46.0)
Hemoglobin: 11.8 g/dL — ABNORMAL LOW (ref 12.0–15.0)
Immature Granulocytes: 0 %
Lymphocytes Relative: 19 %
Lymphs Abs: 0.9 10*3/uL (ref 0.7–4.0)
MCH: 29.2 pg (ref 26.0–34.0)
MCHC: 33.7 g/dL (ref 30.0–36.0)
MCV: 86.6 fL (ref 80.0–100.0)
Monocytes Absolute: 0.5 10*3/uL (ref 0.1–1.0)
Monocytes Relative: 11 %
Neutro Abs: 2.8 10*3/uL (ref 1.7–7.7)
Neutrophils Relative %: 61 %
Platelet Count: 127 10*3/uL — ABNORMAL LOW (ref 150–400)
RBC: 4.04 MIL/uL (ref 3.87–5.11)
RDW: 15.3 % (ref 11.5–15.5)
WBC Count: 4.6 10*3/uL (ref 4.0–10.5)
nRBC: 0 % (ref 0.0–0.2)

## 2020-11-27 LAB — FERRITIN: Ferritin: 377 ng/mL — ABNORMAL HIGH (ref 11–307)

## 2020-11-27 MED ORDER — SODIUM CHLORIDE 0.9 % IV SOLN: Freq: Once | INTRAVENOUS | Status: AC

## 2020-11-27 MED ORDER — SODIUM CHLORIDE 0.9 % IV SOLN
4.6000 mg/kg | Freq: Once | INTRAVENOUS | Status: AC
  Administered 2020-11-27: 300 mg via INTRAVENOUS
  Filled 2020-11-27: qty 12

## 2020-11-27 MED ORDER — ALTEPLASE 2 MG IJ SOLR: 2.0000 mg | Freq: Once | INTRAMUSCULAR | Status: DC | PRN

## 2020-11-27 MED ORDER — HEPARIN SOD (PORK) LOCK FLUSH 100 UNIT/ML IV SOLN
500.0000 [IU] | Freq: Once | INTRAVENOUS | Status: AC | PRN
  Administered 2020-11-27: 500 [IU]

## 2020-11-27 MED ORDER — SODIUM CHLORIDE 0.9% FLUSH
10.0000 mL | INTRAVENOUS | Status: DC | PRN
  Administered 2020-11-27: 10 mL

## 2020-11-27 NOTE — Patient Instructions (Signed)
[facility]  Discharge Instructions: °Thank you for choosing [facility] to provide your oncology and hematology care.  ° °If you have a lab appointment with the Cancer Center, please go directly to the Cancer Center and check in at the registration area. °  °Wear comfortable clothing and clothing appropriate for easy access to any Portacath or PICC line.  ° °We strive to give you quality time with your provider. You may need to reschedule your appointment if you arrive late (15 or more minutes).  Arriving late affects you and other patients whose appointments are after yours.  Also, if you miss three or more appointments without notifying the office, you may be dismissed from the clinic at the provider’s discretion.    °  °For prescription refill requests, have your pharmacy contact our office and allow 72 hours for refills to be completed.   ° °Today you received the following chemotherapy and/or immunotherapy agents: Bevacizumab.     °  °To help prevent nausea and vomiting after your treatment, we encourage you to take your nausea medication as directed. ° °BELOW ARE SYMPTOMS THAT SHOULD BE REPORTED IMMEDIATELY: °*FEVER GREATER THAN 100.4 F (38 °C) OR HIGHER °*CHILLS OR SWEATING °*NAUSEA AND VOMITING THAT IS NOT CONTROLLED WITH YOUR NAUSEA MEDICATION °*UNUSUAL SHORTNESS OF BREATH °*UNUSUAL BRUISING OR BLEEDING °*URINARY PROBLEMS (pain or burning when urinating, or frequent urination) °*BOWEL PROBLEMS (unusual diarrhea, constipation, pain near the anus) °TENDERNESS IN MOUTH AND THROAT WITH OR WITHOUT PRESENCE OF ULCERS (sore throat, sores in mouth, or a toothache) °UNUSUAL RASH, SWELLING OR PAIN  °UNUSUAL VAGINAL DISCHARGE OR ITCHING  ° °Items with * indicate a potential emergency and should be followed up as soon as possible or go to the Emergency Department if any problems should occur. ° °Please show the CHEMOTHERAPY ALERT CARD or IMMUNOTHERAPY ALERT CARD at check-in  to the Emergency Department and triage nurse. ° °Should you have questions after your visit or need to cancel or reschedule your appointment, please contact [facility]  Dept: [phone]  and follow the prompts.  Office hours are 8:00 a.m. to 4:30 p.m. Monday - Friday. Please note that voicemails left after 4:00 p.m. may not be returned until the following business day.  We are closed weekends and major holidays. You have access to a nurse at all times for urgent questions. Please call the main number to the clinic Dept: [phone] and follow the prompts. ° ° °For any non-urgent questions, you may also contact your provider using MyChart. We now offer e-Visits for anyone 18 and older to request care online for non-urgent symptoms. For details visit [email or web address]. °  °Also download the MyChart app! Go to the app store, search "MyChart", open the app, select [facility], and log in with your MyChart username and password. ° °Due to Covid, a mask is required upon entering the hospital/clinic. If you do not have a mask, one will be given to you upon arrival. For doctor visits, patients may have 1 support person aged 18 or older with them. For treatment visits, patients cannot have anyone with them due to current Covid guidelines and our immunocompromised population.  ° °

## 2020-11-27 NOTE — Progress Notes (Addendum)
[facility]   [phone] Fax:([phone]) [phone]   Clinic Follow up Note   Patient Care Team: Mike Craze, DO as PCP - General (Internal Medicine) [name], MD as PCP - Cardiology (Cardiology) Truitt Merle, MD as Consulting Physician (Hematology) Elayne Snare, MD as Consulting Physician (Endocrinology)  Date of Service:  11/27/2020  CHIEF COMPLAINT: f/u of anemia due to chronic blood loss, HHT  CURRENT THERAPY:  1. Iv feraheme as needed if ferritin<100, latest on 12/2019 2. Avastin 5mg /kg every 2 weeks, starting 05/09/18. Reduced to every 4 weeks starting 11/07/18. Reduced to every 2 months starting 01/02/19. Held 03/09/19-05/26/19 due to high proteinuria. Give Avastin every 6 weeks now  3. Blood transfusion if Hg<8.0  ASSESSMENT & PLAN:  [name] is a 80 y.o. female with   1. Hereditary hemorrhagic telangiectasia (HHT), with epistaxis and frequent GI bleeding -She receives blood transfusion as needed, but has not needed since 04/2018 when she started Beva. -She is on Avastin intermittently/PRN due to high proteinuria. -anemia worsened off Avastin. She was given her most recent dose on 10/16/20. -labs reviewed, only her urine protein is back and is <30. We will proceed with Avastin today.   2. Iron deficiency anemia secondary to #1 -She receives IV Feraheme as needed for Ferritin <100, most recently 11/04/20 -hgb 11.8 today (11/27/20), iron panel pending.   3. Dementia  -She takes Aricept and memantine. On Keppra for seizures.  -f/u with neurology.    5. DM, CHF, CKD stage III and HTN secondary to Avastin - followed by Dr. Dwyane Dee -She has some mild lower extremity swelling today. She is not aware of this being present before, nor is her husband. -I recommend they monitor this   6. Proteinuria -This is likely secondary to avastin, but she also has diabetes  -She received bevacizumab as needed     Plan -proceed with Bevacizumab today -lab  monthly, iv feraheme if ferritin<100 -lab and beva in 6 and 12 weeks -f/u in 12 weeks -Patient needs family member to come in with her in future, due to her dementia.   No problem-specific Assessment & Plan notes found for this encounter.   INTERVAL HISTORY:  [name] is here for a follow up of anemia and HHT. She was last seen by me on 09/04/20. She presents to the clinic alone. She notes her husband brought her here. When asked why he didn't come in with her, she states she didn't know he could. We connected with him by phone. She reports she is okay. She notes she has assistants that come in her house to clean and cook and help her. She reports her appetite has been poor lately. Her husband did not note any recent concerns.   All other systems were reviewed with the patient and are negative.  MEDICAL HISTORY:  Past Medical History:  Diagnosis Date   Chronic anemia    Chronic diastolic CHF (congestive heart failure) ([place]) 10/03/2013   Chronic GI bleeding    /notes 11/29/2014   Family history of anesthesia complication    "niece has a hard time coming out" (09/15/2012)   Frequent nosebleeds    chronic   Gastric AV malformation    /notes 11/29/2014   GERD (gastroesophageal reflux disease)    Heart murmur 04/01/2017   Moderate AVSC on echo 09/2016   History of blood transfusion "several"   History of epistaxis    HTN (hypertension), benign 03/02/2012   Hyperlipidemia  Iron deficiency anemia    chronic infusions"   Lichen planus    Both lower extremities   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[name]    [name] 11/29/2014   Overgrown toenails 03/18/2017   Pneumonia 1990's X 2   Pulmonary HTN ([place]) 04/01/2017   PASP 16mmHg on echo 09/2016 and 47mmHg by echo 2019   Seizures ([place]) 09/2014   Symptomatic anemia 11/29/2014   Telangiectasia    Gastric    Type II diabetes mellitus ([place]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    SURGICAL HISTORY: Past Surgical History:  Procedure Laterality Date    CATARACT EXTRACTION     "I think it was just one eye"   ESOPHAGOGASTRODUODENOSCOPY  02/26/2011   Procedure: ESOPHAGOGASTRODUODENOSCOPY (EGD);  Surgeon: [name], MD;  Location: [name];  Service: Endoscopy;  Laterality: N/A;   ESOPHAGOGASTRODUODENOSCOPY N/A 11/08/2012   Procedure: ESOPHAGOGASTRODUODENOSCOPY (EGD);  Surgeon: [name], MD;  Location: [name];  Service: Endoscopy;  Laterality: N/A;   ESOPHAGOGASTRODUODENOSCOPY N/A 10/04/2013   Procedure: ESOPHAGOGASTRODUODENOSCOPY (EGD);  Surgeon: Winfield Cunas., MD;  Location: [name];  Service: Endoscopy;  Laterality: N/A;  with APC on stand-by   ESOPHAGOGASTRODUODENOSCOPY N/A 07/06/2014   Procedure: ESOPHAGOGASTRODUODENOSCOPY (EGD);  Surgeon: Clarene [facility], MD;  Location: [name];  Service: Endoscopy;  Laterality: N/A;   ESOPHAGOGASTRODUODENOSCOPY N/A 09/05/2014   Procedure: ESOPHAGOGASTRODUODENOSCOPY (EGD);  Surgeon: Laurence Spates, MD;  Location: [name];  Service: Endoscopy;  Laterality: N/A;  APC on standby to control bleeding   ESOPHAGOGASTRODUODENOSCOPY N/A 11/29/2014   Procedure: ESOPHAGOGASTRODUODENOSCOPY (EGD);  Surgeon: [name], MD;  Location: [facility] ENDOSCOPY;  Service: Endoscopy;  Laterality: N/A;   ESOPHAGOGASTRODUODENOSCOPY N/A 09/28/2015   Procedure: ESOPHAGOGASTRODUODENOSCOPY (EGD);  Surgeon: Clarene [place], MD;  Location: [facility] ENDOSCOPY;  Service: Endoscopy;  Laterality: N/A;   ESOPHAGOGASTRODUODENOSCOPY (EGD) WITH PROPOFOL N/A 12/04/2017   Procedure: ESOPHAGOGASTRODUODENOSCOPY (EGD) WITH PROPOFOL;  Surgeon: [name], MD;  Location: [facility];  Service: Endoscopy;  Laterality: N/A;   ESOPHAGOGASTRODUODENOSCOPY ENDOSCOPY  08/19/2006   with laser treatment   HOT HEMOSTASIS N/A 07/06/2014   Procedure: HOT HEMOSTASIS (ARGON PLASMA COAGULATION/BICAP);  Surgeon: Clarene [place], MD;  Location: [name];  Service: Endoscopy;  Laterality: N/A;   HOT HEMOSTASIS N/A 09/28/2015   Procedure: HOT  HEMOSTASIS (ARGON PLASMA COAGULATION/BICAP);  Surgeon: Clarene Essex, MD;  Location: [facility] ENDOSCOPY;  Service: Endoscopy;  Laterality: N/A;   HOT HEMOSTASIS N/A 12/04/2017   Procedure: HOT HEMOSTASIS (ARGON PLASMA COAGULATION/BICAP);  Surgeon: [name], MD;  Location: [facility];  Service: Endoscopy;  Laterality: N/A;   NASAL HEMORRHAGE CONTROL     "for bleeding"    SAVORY DILATION  02/26/2011   Procedure: SAVORY DILATION;  Surgeon: [name], MD;  Location: [name];  Service: Endoscopy;  Laterality: N/A;  c-arm needed   SUBMUCOSAL INJECTION  12/04/2017   Procedure: SUBMUCOSAL INJECTION;  Surgeon: [name], MD;  Location: [facility] ENDOSCOPY;  Service: Endoscopy;;    I have reviewed the social history and family history with the patient and they are unchanged from previous note.  ALLERGIES:  is allergic to aspirin.  MEDICATIONS:  Current Outpatient Medications  Medication Sig Dispense Refill   amLODipine (NORVASC) 10 MG tablet TAKE 1 TABLET BY MOUTH EVERY MORNING 30 tablet 10   memantine (NAMENDA) 10 MG tablet TAKE 1 TABLET BY MOUTH TWICE DAILY 60 tablet 10   ACCU-CHEK GUIDE test strip USE AS INSTRUCTED TO CHECK BLOOD SUGAR 3 TIMES DAILY. 100 each 0   Accu-Chek Softclix Lancets lancets 1  each by Other route 3 (three) times daily. Use as instructed to check blood sugar 3 times per day dx code E11.65 100 each 3   Continuous Blood Gluc Sensor (FREESTYLE LIBRE 2 SENSOR) MISC 2 Devices by Does not apply route every 14 (fourteen) days. 2 each 3   donepezil (ARICEPT) 10 MG tablet TAKE 1 TABLET BY MOUTH AT BEDTIME 30 tablet 8   Elastic Bandages & Supports (MEDICAL COMPRESSION STOCKINGS) MISC Wear as much as possible while awake to reduce swelling 2 each 0   gabapentin (NEURONTIN) 100 MG capsule TAKE 1 CAPSULE BY MOUTH TWICE DAILY 60 capsule 10   glucose blood (ACCU-CHEK GUIDE) test strip USE AS INSTRUCTED TO CHECK BLOOD SUGAR 3 TIMES DAILY.     HUMALOG MIX 75/25 KWIKPEN (75-25) 100  UNIT/ML Kwikpen INJECT 10 UNITS SUBCUTANEOUSLY ONCE DAILY 9 mL 10   insulin lispro (HUMALOG KWIKPEN) 100 UNIT/ML KwikPen 3 units before dinner 15 mL 1   Insulin Pen Needle (B-D UF III MINI PEN NEEDLES) 31G X 5 MM MISC USE TO INJECT INSULIN 3 TIMES A DAY 100 each 3   Lancets MISC 1 each by Other route.     levETIRAcetam (KEPPRA) 500 MG tablet TAKE 1 TABLET BY MOUTH TWICE DAILY 60 tablet 10   olmesartan (BENICAR) 20 MG tablet Take 10 mg by mouth daily.     Potassium Chloride ER 20 MEQ TBCR Take 20 mEq by mouth 2 (two) times daily. 60 tablet 5   torsemide (DEMADEX) 20 MG tablet Take 1 tablet (20 mg total) by mouth daily. 30 tablet 11   triamcinolone ointment (KENALOG) 0.5 % APPLY 1 APPLICATION TOPICALLY TO RASH TWICE DAILY FOR ITCHING 30 g 10   TRULICITY 1.5 QM/0.8QP SOPN INJECT 1.5MG  SUBCUTANEOUSLY EVERY WEEK ON THURSDAY 4 mL 10   No current facility-administered medications for this visit.   Facility-Administered Medications Ordered in Other Visits  Medication Dose Route Frequency Provider Last Rate Last Admin   sodium chloride 0.9 % injection 10 mL  10 mL Intracatheter PRN Truitt Merle, MD       sodium chloride flush (NS) 0.9 % injection 10 mL  10 mL Intracatheter PRN Truitt Merle, MD   10 mL at 08/18/19 1717    PHYSICAL EXAMINATION: ECOG PERFORMANCE STATUS: 3 - Symptomatic, >50% confined to bed  Vitals:   11/27/20 1158  BP: (!) 131/55  Pulse: (!) 53  Resp: 17  Temp: 98.5 F (36.9 C)  SpO2: 100%   Wt Readings from Last 3 Encounters:  11/27/20 139 lb 6.4 oz (63.2 kg)  10/03/20 141 lb 12.8 oz (64.3 kg)  09/30/20 150 lb 5.7 oz (68.2 kg)     GENERAL:alert, no distress and comfortable SKIN: skin color normal, no rashes or significant lesions EYES: normal, Conjunctiva are pink and non-injected, sclera clear  NEURO: alert & oriented x 3 with fluent speech  LABORATORY DATA:  I have reviewed the data as listed CBC Latest Ref Rng & Units 11/27/2020 10/16/2020 09/29/2020  WBC 4.0 - 10.5 K/uL  4.6 6.3 8.6  Hemoglobin 12.0 - 15.0 g/dL 11.8(L) 12.3 10.4(L)  Hematocrit 36.0 - 46.0 % 35.0(L) 38.4 31.8(L)  Platelets 150 - 400 K/uL 127(L) 148(L) 199     CMP Latest Ref Rng & Units 11/27/2020 10/16/2020 09/30/2020  Glucose 70 - 99 mg/dL 195(H) 56(L) 109(H)  BUN 8 - 23 mg/dL 25(H) 20 9  Creatinine 0.44 - 1.00 mg/dL 2.06(H) 2.04(H) 1.01(H)  Sodium 135 - 145 mmol/L 146(H) 148(H) 141  Potassium  3.5 - 5.1 mmol/L 4.5 4.2 4.1  Chloride 98 - 111 mmol/L 111 111 106  CO2 22 - 32 mmol/L 23 28 29   Calcium 8.9 - 10.3 mg/dL 9.7 9.9 9.1  Total Protein 6.5 - 8.1 g/dL 6.9 6.6 -  Total Bilirubin 0.3 - 1.2 mg/dL 0.4 0.7 -  Alkaline Phos 38 - 126 U/L 167(H) 160(H) -  AST 15 - 41 U/L 31 23 -  ALT 0 - 44 U/L 29 13 -      RADIOGRAPHIC STUDIES: I have personally reviewed the radiological images as listed and agreed with the findings in the report. No results found.    No orders of the defined types were placed in this encounter.  All questions were answered. The patient knows to call the clinic with any problems, questions or concerns. No barriers to learning was detected. The total time spent in the appointment was 25 minutes.     Truitt Merle, MD 11/27/2020   I, Wilburn Mylar, am acting as scribe for Truitt Merle, MD.   I have reviewed the above documentation for accuracy and completeness, and I agree with the above.

## 2020-11-28 ENCOUNTER — Encounter: Payer: Self-pay | Admitting: Endocrinology

## 2020-11-28 ENCOUNTER — Ambulatory Visit (INDEPENDENT_AMBULATORY_CARE_PROVIDER_SITE_OTHER): Payer: Medicare Other | Admitting: Endocrinology

## 2020-11-28 ENCOUNTER — Telehealth: Payer: Self-pay | Admitting: Hematology

## 2020-11-28 VITALS — BP 110/60 | HR 51 | Ht 63.0 in | Wt 141.4 lb

## 2020-11-28 DIAGNOSIS — Z794 Long term (current) use of insulin: Secondary | ICD-10-CM | POA: Diagnosis not present

## 2020-11-28 DIAGNOSIS — E1165 Type 2 diabetes mellitus with hyperglycemia: Secondary | ICD-10-CM | POA: Diagnosis not present

## 2020-11-28 LAB — POCT GLYCOSYLATED HEMOGLOBIN (HGB A1C): Hemoglobin A1C: 7.8 % — AB (ref 4.0–5.6)

## 2020-11-28 NOTE — Telephone Encounter (Signed)
Scheduled follow-up appointments per 10/19 los. Patient's niece is aware.

## 2020-11-28 NOTE — Patient Instructions (Addendum)
Cloudy insulin in am is 8 units and Clear at supper is 5 units  Increase water

## 2020-11-28 NOTE — Progress Notes (Signed)
Patient ID: [name], female   DOB: [birth year], 80 y.o.   MRN: [id number]           Reason for Appointment: Follow-up for Type 2 Diabetes  Referring PCP: Koleen Distance   History of Present Illness:          Date of diagnosis of type 2 diabetes mellitus: 1988       Background history:   Patient has been on insulin for a few years and she is unclear when this was started, according to Epic she was started on Levemir in 2015 Prior to that likely had been on various oral hypoglycemic agents including metformin, glipizide and Tradjenta Her level of control has been somewhat variable but not consistently poor; her A1c has ranged between 5.6 and 7.6  Recent history:   Her A1c is higher at 7.8 compared to 6.7   INSULIN regimen is: Humalog mix 10 units before breakfast and Humalog 3 units before dinner    Non-insulin hypoglycemic drugs prescribed: Trulicity 1.5 mg weekly  Current management, blood sugar patterns and problems identified:  On her visit a couple of months ago she was eating less after her hospitalization and was needing less insulin with home blood sugar averaging only 127  No she appears to be eating 3 meals a day although she thinks her appetite is still not normal  With this her blood sugars have been as high as 500+  As before her blood sugars periodically go very high and then come on down  Overall has high variability in her blood sugars with standard deviation 122  However not clear if her monitoring is completely accurate because sometimes meter get the low reading right after a slightly high reading She thinks she has difficulty with her meter operation at times More recently she has had a couple of episodes of her blood sugar midday This is despite her eating eggs, bacon and grits or toast in the morning  late in the evening but not consistently  She was recommended taking only 10 units of Humalog mix before breakfast and 3 units at suppertime and her husband  confirms that she is getting the proper doses However not clear if she takes her insulin 1 time at suppertime causing high readings at bedtime She monitors her blood sugars somewhat erratically with multiple readings on some days and not and occasionally She has been regular with Trulicity which she takes every Tuesday         Side effects from medications have been: None known     Meal times are:  Breakfast is at variable times between 8 AM-9  Dinner 7 pm  Typical meal intake: Breakfast is Cheerios or raisin Bran               Exercise:  Unable to  Glucose monitoring:  Checking up to 6 times a day       Glucometer: Accu-Chek    Blood Glucose readings with readings for the last week and averages for the last 30 days  Blood sugar patterns for the last 30 days Blood sugars are mostly high overall except periodically within target overnight, midday or late evening, tendency to low blood sugars around noon, 4 PM and once midnight  PRE-MEAL Morning Lunch Dinner Bedtime Overall  Glucose range:     61-500+  Mean/median: 210 167 297 166 233   POST-MEAL PC Breakfast PC Lunch 8-10 PM  Glucose range:     Mean/median:   301  PRE-MEAL Fasting Lunch Dinner Bedtime Overall  Glucose range:     48-314  Mean/median: 99   141 127+/- 56     Dietician visit, most recent: 11/2018  Weight history:  Wt Readings from Last 3 Encounters:  11/28/20 141 lb 6.4 oz (64.1 kg)  11/27/20 139 lb 6.4 oz (63.2 kg)  10/03/20 141 lb 12.8 oz (64.3 kg)    Glycemic control:   Lab Results  Component Value Date   HGBA1C 7.8 (A) 11/28/2020   HGBA1C 6.7 (A) 08/28/2020   HGBA1C 7.1 (A) 04/25/2020   Lab Results  Component Value Date   MICROALBUR 1,289.4 (H) 03/07/2019   LDLCALC 57 01/30/2019   CREATININE 2.06 (H) 11/27/2020   Lab Results  Component Value Date   MICRALBCREAT 5 03/28/2018    Lab Results  Component Value Date   FRUCTOSAMINE 404 (H) 10/31/2019   FRUCTOSAMINE 256 06/26/2019     Office Visit on 11/28/2020  Component Date Value Ref Range Status   Hemoglobin A1C 11/28/2020 7.8 (A)  4.0 - 5.6 % Final  Infusion on 11/27/2020  Component Date Value Ref Range Status   Protein, ur 11/27/2020 <30  NEGATIVE mg/dL Final   Performed at [facility], [place] [address], [place], [place] [address]   Ferritin 11/27/2020 377 (A)  11 - 307 ng/mL Final   Performed at [facility], [place] [address], [place], [place] [address]   Sodium 11/27/2020 146 (A)  135 - 145 mmol/L Final   Potassium 11/27/2020 4.5  3.5 - 5.1 mmol/L Final   Chloride 11/27/2020 111  98 - 111 mmol/L Final   CO2 11/27/2020 23  22 - 32 mmol/L Final   Glucose, Bld 11/27/2020 195 (A)  70 - 99 mg/dL Final   Glucose reference range applies only to samples taken after fasting for at least 8 hours.   BUN 11/27/2020 25 (A)  8 - 23 mg/dL Final   Creatinine 11/27/2020 2.06 (A)  0.44 - 1.00 mg/dL Final   Calcium 11/27/2020 9.7  8.9 - 10.3 mg/dL Final   Total Protein 11/27/2020 6.9  6.5 - 8.1 g/dL Final   Albumin 11/27/2020 3.3 (A)  3.5 - 5.0 g/dL Final   AST 11/27/2020 31  15 - 41 U/L Final   ALT 11/27/2020 29  0 - 44 U/L Final   Alkaline Phosphatase 11/27/2020 167 (A)  38 - 126 U/L Final   Total Bilirubin 11/27/2020 0.4  0.3 - 1.2 mg/dL Final   GFR, Estimated 11/27/2020 24 (A)  >60 mL/min Final   Comment: (NOTE) Calculated using the CKD-EPI Creatinine Equation (2021)    Anion gap 11/27/2020 12  5 - 15 Final   Performed at [facility], [place] [address] [place], [place] [address]   WBC Count 11/27/2020 4.6  4.0 - 10.5 K/uL Final   RBC 11/27/2020 4.04  3.87 - 5.11 MIL/uL Final   Hemoglobin 11/27/2020 11.8 (A)  12.0 - 15.0 g/dL Final   HCT 11/27/2020 35.0 (A)  36.0 - 46.0 % Final   MCV 11/27/2020 86.6  80.0 - 100.0 fL Final   MCH 11/27/2020 29.2  26.0 - 34.0 pg Final   MCHC 11/27/2020 33.7  30.0 - 36.0 g/dL Final   RDW 11/27/2020 15.3  11.5 -  15.5 % Final   Platelet Count 11/27/2020 127 (A)  150 - 400 K/uL Final   nRBC 11/27/2020 0.0  0.0 - 0.2 % Final   Neutrophils Relative % 11/27/2020  61  % Final   Neutro Abs 11/27/2020 2.8  1.7 - 7.7 K/uL Final   Lymphocytes Relative 11/27/2020 19  % Final   Lymphs Abs 11/27/2020 0.9  0.7 - 4.0 K/uL Final   Monocytes Relative 11/27/2020 11  % Final   Monocytes Absolute 11/27/2020 0.5  0.1 - 1.0 K/uL Final   Eosinophils Relative 11/27/2020 8  % Final   Eosinophils Absolute 11/27/2020 0.4  0.0 - 0.5 K/uL Final   Basophils Relative 11/27/2020 1  % Final   Basophils Absolute 11/27/2020 0.0  0.0 - 0.1 K/uL Final   Immature Granulocytes 11/27/2020 0  % Final   Abs Immature Granulocytes 11/27/2020 0.01  0.00 - 0.07 K/uL Final   Performed at [facility], [place] [address], [place], [place] [address]    Allergies as of 11/28/2020       Reactions   Aspirin Nausea And Vomiting   Other reaction(s): Other (See Comments)        Medication List        Accurate as of November 28, 2020  2:12 PM. If you have any questions, ask your nurse or doctor.          Accu-Chek Guide test strip Generic drug: glucose blood USE AS INSTRUCTED TO CHECK BLOOD SUGAR 3 TIMES DAILY.   Accu-Chek Guide test strip Generic drug: glucose blood USE AS INSTRUCTED TO CHECK BLOOD SUGAR 3 TIMES DAILY.   Accu-Chek Softclix Lancets lancets 1 each by Other route 3 (three) times daily. Use as instructed to check blood sugar 3 times per day dx code E11.65   Lancets Misc 1 each by Other route.   amLODipine 10 MG tablet Commonly known as: NORVASC TAKE 1 TABLET BY MOUTH EVERY MORNING   B-D UF III MINI PEN NEEDLES 31G X 5 MM Misc Generic drug: Insulin Pen Needle USE TO INJECT INSULIN 3 TIMES A DAY   donepezil 10 MG tablet Commonly known as: ARICEPT TAKE 1 TABLET BY MOUTH AT BEDTIME   FreeStyle Libre 2 Sensor Misc 2 Devices by Does not apply route every 14 (fourteen) days.   gabapentin  100 MG capsule Commonly known as: NEURONTIN TAKE 1 CAPSULE BY MOUTH TWICE DAILY   HumaLOG Mix 75/25 KwikPen (75-25) 100 UNIT/ML Kwikpen Generic drug: Insulin Lispro Prot & Lispro INJECT 10 UNITS SUBCUTANEOUSLY ONCE DAILY   insulin lispro 100 UNIT/ML KwikPen Commonly known as: HumaLOG KwikPen 3 units before dinner   levETIRAcetam 500 MG tablet Commonly known as: KEPPRA TAKE 1 TABLET BY MOUTH TWICE DAILY   Medical Compression Stockings Misc Wear as much as possible while awake to reduce swelling   memantine 10 MG tablet Commonly known as: NAMENDA TAKE 1 TABLET BY MOUTH TWICE DAILY   olmesartan 20 MG tablet Commonly known as: BENICAR Take 10 mg by mouth daily.   Potassium Chloride ER 20 MEQ Tbcr Take 20 mEq by mouth 2 (two) times daily.   torsemide 20 MG tablet Commonly known as: DEMADEX Take 1 tablet (20 mg total) by mouth daily.   triamcinolone ointment 0.5 % Commonly known as: KENALOG APPLY 1 APPLICATION TOPICALLY TO RASH TWICE DAILY FOR ITCHING   Trulicity 1.5 YT/0.3TW Sopn Generic drug: Dulaglutide INJECT 1.5MG  SUBCUTANEOUSLY EVERY WEEK ON THURSDAY        Allergies:  Allergies  Allergen Reactions   Aspirin Nausea And Vomiting    Other reaction(s): Other (See Comments)    Past Medical History:  Diagnosis Date   Chronic anemia  Chronic diastolic CHF (congestive heart failure) ([place]) 10/03/2013   Chronic GI bleeding    /notes 11/29/2014   Family history of anesthesia complication    "niece has a hard time coming out" (09/15/2012)   Frequent nosebleeds    chronic   Gastric AV malformation    /notes 11/29/2014   GERD (gastroesophageal reflux disease)    Heart murmur 04/01/2017   Moderate AVSC on echo 09/2016   History of blood transfusion "several"   History of epistaxis    HTN (hypertension), benign 03/02/2012   Hyperlipidemia    Iron deficiency anemia    chronic infusions"   Lichen planus    Both lower extremities   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    Past Surgical History:  Procedure Laterality Date   CATARACT EXTRACTION     "I think it was just one eye"   ESOPHAGOGASTRODUODENOSCOPY  02/26/2011   Procedure: ESOPHAGOGASTRODUODENOSCOPY (EGD);  Surgeon: [name], MD;  Location: [name];  Service: Endoscopy;  Laterality: N/A;   ESOPHAGOGASTRODUODENOSCOPY N/A 11/08/2012   Procedure: ESOPHAGOGASTRODUODENOSCOPY (EGD);  Surgeon: [name], MD;  Location: [name];  Service: Endoscopy;  Laterality: N/A;   ESOPHAGOGASTRODUODENOSCOPY N/A 10/04/2013   Procedure: ESOPHAGOGASTRODUODENOSCOPY (EGD);  Surgeon: Winfield Cunas., MD;  Location: [name];  Service: Endoscopy;  Laterality: N/A;  with APC on stand-by   ESOPHAGOGASTRODUODENOSCOPY N/A 07/06/2014   Procedure: ESOPHAGOGASTRODUODENOSCOPY (EGD);  Surgeon: Clarene Essex, MD;  Location: [name];  Service: Endoscopy;  Laterality: N/A;   ESOPHAGOGASTRODUODENOSCOPY N/A 09/05/2014   Procedure: ESOPHAGOGASTRODUODENOSCOPY (EGD);  Surgeon: Laurence Spates, MD;  Location: [name];  Service: Endoscopy;  Laterality: N/A;  APC on standby to control bleeding   ESOPHAGOGASTRODUODENOSCOPY N/A 11/29/2014   Procedure: ESOPHAGOGASTRODUODENOSCOPY (EGD);  Surgeon: [name], MD;  Location: [facility] ENDOSCOPY;  Service: Endoscopy;  Laterality: N/A;   ESOPHAGOGASTRODUODENOSCOPY N/A 09/28/2015   Procedure: ESOPHAGOGASTRODUODENOSCOPY (EGD);  Surgeon: Clarene Essex, MD;  Location: [facility] ENDOSCOPY;  Service: Endoscopy;  Laterality: N/A;   ESOPHAGOGASTRODUODENOSCOPY (EGD) WITH PROPOFOL N/A 12/04/2017   Procedure: ESOPHAGOGASTRODUODENOSCOPY (EGD) WITH PROPOFOL;  Surgeon: [name], MD;   Location: [facility];  Service: Endoscopy;  Laterality: N/A;   ESOPHAGOGASTRODUODENOSCOPY ENDOSCOPY  08/19/2006   with laser treatment   HOT HEMOSTASIS N/A 07/06/2014   Procedure: HOT HEMOSTASIS (ARGON PLASMA COAGULATION/BICAP);  Surgeon: Clarene Essex, MD;  Location: [name];  Service: Endoscopy;  Laterality: N/A;   HOT HEMOSTASIS N/A 09/28/2015   Procedure: HOT HEMOSTASIS (ARGON PLASMA COAGULATION/BICAP);  Surgeon: Clarene Essex, MD;  [facility];  Service: Endoscopy;  Laterality: N/A;   HOT HEMOSTASIS N/A 12/04/2017   Procedure: HOT HEMOSTASIS (ARGON PLASMA COAGULATION/BICAP);  Surgeon: [name], MD;  Location: [facility];  Service: Endoscopy;  Laterality: N/A;   NASAL HEMORRHAGE CONTROL     "for bleeding"    SAVORY DILATION  02/26/2011   Procedure: SAVORY DILATION;  Surgeon: [name], MD;  Location: [name];  Service: Endoscopy;  Laterality: N/A;  c-arm needed   SUBMUCOSAL INJECTION  12/04/2017   Procedure: SUBMUCOSAL INJECTION;  Surgeon: [name], MD;  Location: [facility] ENDOSCOPY;  Service: Endoscopy;;    Family  History  Problem Relation Age of Onset   Stroke Father    Healthy Mother    Breast cancer Other    Diabetes Niece    Malignant hyperthermia Neg Hx    Seizures Neg Hx     Social History:  reports that she quit smoking about 49 years ago. Her smoking use included cigarettes. She has a 20.00 pack-year smoking history. She has never used smokeless tobacco. She reports that she does not drink alcohol and does not use drugs.   Review of Systems   Lipid history: Not on a statin drug    Lab Results  Component Value Date   CHOL 133 01/30/2019   HDL 41.30 01/30/2019   LDLCALC 57 01/30/2019   TRIG 176.0 (H) 01/30/2019   CHOLHDL 3 01/30/2019           Hypertension: Treated with amlodipine and olmesartan  BP Readings from Last 3 Encounters:  11/28/20 110/60  11/27/20 (!) 131/55  11/04/20 (!) 122/54   Renal function history: Has CKD  of unclear etiology  She is reportedly on Demadex prescribed by PCP However her sodium levels are relatively high and   Lab Results  Component Value Date   CREATININE 2.06 (H) 11/27/2020   CREATININE 2.04 (H) 10/16/2020   CREATININE 1.01 (H) 09/30/2020   Lab Results  Component Value Date   K 4.5 11/27/2020     Most recent eye exam was in 07/2018  Most recent foot exam: 06/2018 by podiatrist with no evidence of sensory loss or reduction in sensation   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, microalbuminuria     Physical Examination:  BP 110/60   Pulse (!) 51   Ht 5\' 3"  (1.6 m)   Wt 141 lb 6.4 oz (64.1 kg)   LMP  (LMP Unknown)   SpO2 99%   BMI 25.05 kg/m    ASSESSMENT:  Diabetes type 2 on insulin  See history of present illness for detailed discussion of current diabetes management, blood sugar patterns and problems identified  A1c is higher at 7.8  She is currently on Trulicity 1.5 mg, Humalog 3 units at dinner and Humalog mix 10 units in the morning  Blood sugars in the last month have been highly variable As discussed above her blood sugars are generally higher late at night and sometimes overnight but not necessarily fasting She still has a tendency to relatively low readings midday and afternoon but no consistent pattern seen Most likely her high readings in the evening from late administration of her insulin at suppertime Hypoglycemia has been infrequent  Hypernatremia: Likely not getting enough fluid intake  CKD: She has seen the nephrologist, last visit was about 3 months ago   PLAN:    Glucose importance of taking insulin doses right at the mealtime and not later  Reduce morning insulin to 8 units Increase suppertime dose to 5 units and take right at mealtimes consistently To call if starting to get any unusual low sugars Advised her husband to follow instructions given on this visit summary Continue Trulicity 1.5 mg weekly  She will try  to increase her fluid intake especially water throughout the day   Patient Instructions  Cloudy insulin in am is 8 units and Clear at supper is 5 units  Increase water  Total visit time including counseling = 30 minutes   Elayne Snare 11/28/2020, 2:12 PM   Note: This office note was prepared with Dragon voice recognition system technology. Any transcriptional errors that result  from this process are unintentional.

## 2020-12-21 ENCOUNTER — Other Ambulatory Visit: Payer: Self-pay | Admitting: Endocrinology

## 2020-12-23 ENCOUNTER — Other Ambulatory Visit: Payer: Self-pay

## 2020-12-23 ENCOUNTER — Inpatient Hospital Stay (HOSPITAL_COMMUNITY)
Admission: EM | Admit: 2020-12-23 | Discharge: 2020-12-26 | DRG: 378 | Disposition: A | Discharge status: Home / Self Care | Payer: Medicare Other | Attending: Internal Medicine | Admitting: Internal Medicine

## 2020-12-23 ENCOUNTER — Encounter (HOSPITAL_COMMUNITY): Payer: Self-pay

## 2020-12-23 DIAGNOSIS — Z833 Family history of diabetes mellitus: Secondary | ICD-10-CM

## 2020-12-23 DIAGNOSIS — I2721 Secondary pulmonary arterial hypertension: Secondary | ICD-10-CM | POA: Diagnosis present

## 2020-12-23 DIAGNOSIS — Z20822 Contact with and (suspected) exposure to covid-19: Secondary | ICD-10-CM | POA: Diagnosis present

## 2020-12-23 DIAGNOSIS — I5032 Chronic diastolic (congestive) heart failure: Secondary | ICD-10-CM | POA: Diagnosis present

## 2020-12-23 DIAGNOSIS — K922 Gastrointestinal hemorrhage, unspecified: Secondary | ICD-10-CM

## 2020-12-23 DIAGNOSIS — Z79899 Other long term (current) drug therapy: Secondary | ICD-10-CM

## 2020-12-23 DIAGNOSIS — E1165 Type 2 diabetes mellitus with hyperglycemia: Secondary | ICD-10-CM | POA: Diagnosis present

## 2020-12-23 DIAGNOSIS — E1122 Type 2 diabetes mellitus with diabetic chronic kidney disease: Secondary | ICD-10-CM | POA: Diagnosis present

## 2020-12-23 DIAGNOSIS — K254 Chronic or unspecified gastric ulcer with hemorrhage: Secondary | ICD-10-CM | POA: Diagnosis present

## 2020-12-23 DIAGNOSIS — I130 Hypertensive heart and chronic kidney disease with heart failure and stage 1 through stage 4 chronic kidney disease, or unspecified chronic kidney disease: Secondary | ICD-10-CM | POA: Diagnosis present

## 2020-12-23 DIAGNOSIS — D509 Iron deficiency anemia, unspecified: Secondary | ICD-10-CM | POA: Diagnosis present

## 2020-12-23 DIAGNOSIS — E785 Hyperlipidemia, unspecified: Secondary | ICD-10-CM | POA: Diagnosis present

## 2020-12-23 DIAGNOSIS — Z8701 Personal history of pneumonia (recurrent): Secondary | ICD-10-CM | POA: Diagnosis not present

## 2020-12-23 DIAGNOSIS — K449 Diaphragmatic hernia without obstruction or gangrene: Secondary | ICD-10-CM | POA: Diagnosis present

## 2020-12-23 DIAGNOSIS — R571 Hypovolemic shock: Secondary | ICD-10-CM | POA: Diagnosis present

## 2020-12-23 DIAGNOSIS — N179 Acute kidney failure, unspecified: Secondary | ICD-10-CM

## 2020-12-23 DIAGNOSIS — D62 Acute posthemorrhagic anemia: Secondary | ICD-10-CM | POA: Diagnosis present

## 2020-12-23 DIAGNOSIS — Z8249 Family history of ischemic heart disease and other diseases of the circulatory system: Secondary | ICD-10-CM | POA: Diagnosis not present

## 2020-12-23 DIAGNOSIS — Z794 Long term (current) use of insulin: Secondary | ICD-10-CM

## 2020-12-23 DIAGNOSIS — K2100 Gastro-esophageal reflux disease with esophagitis, without bleeding: Secondary | ICD-10-CM | POA: Diagnosis present

## 2020-12-23 DIAGNOSIS — Z886 Allergy status to analgesic agent status: Secondary | ICD-10-CM

## 2020-12-23 DIAGNOSIS — Z8711 Personal history of peptic ulcer disease: Secondary | ICD-10-CM | POA: Diagnosis not present

## 2020-12-23 DIAGNOSIS — M25511 Pain in right shoulder: Secondary | ICD-10-CM | POA: Diagnosis present

## 2020-12-23 DIAGNOSIS — Z87891 Personal history of nicotine dependence: Secondary | ICD-10-CM | POA: Diagnosis not present

## 2020-12-23 DIAGNOSIS — G8929 Other chronic pain: Secondary | ICD-10-CM | POA: Diagnosis present

## 2020-12-23 DIAGNOSIS — R578 Other shock: Secondary | ICD-10-CM | POA: Insufficient documentation

## 2020-12-23 DIAGNOSIS — K920 Hematemesis: Secondary | ICD-10-CM | POA: Diagnosis present

## 2020-12-23 DIAGNOSIS — I780 Hereditary hemorrhagic telangiectasia: Secondary | ICD-10-CM | POA: Diagnosis present

## 2020-12-23 DIAGNOSIS — G40909 Epilepsy, unspecified, not intractable, without status epilepticus: Secondary | ICD-10-CM | POA: Diagnosis present

## 2020-12-23 DIAGNOSIS — E869 Volume depletion, unspecified: Secondary | ICD-10-CM | POA: Diagnosis present

## 2020-12-23 DIAGNOSIS — R011 Cardiac murmur, unspecified: Secondary | ICD-10-CM | POA: Diagnosis present

## 2020-12-23 DIAGNOSIS — E119 Type 2 diabetes mellitus without complications: Secondary | ICD-10-CM

## 2020-12-23 DIAGNOSIS — Z7985 Long-term (current) use of injectable non-insulin antidiabetic drugs: Secondary | ICD-10-CM

## 2020-12-23 DIAGNOSIS — N1832 Chronic kidney disease, stage 3b: Secondary | ICD-10-CM | POA: Diagnosis present

## 2020-12-23 LAB — CBC WITH DIFFERENTIAL/PLATELET
Abs Immature Granulocytes: 0.05 10*3/uL (ref 0.00–0.07)
Basophils Absolute: 0 10*3/uL (ref 0.0–0.1)
Basophils Relative: 0 %
Eosinophils Absolute: 0.3 10*3/uL (ref 0.0–0.5)
Eosinophils Relative: 4 %
HCT: 17.1 % — ABNORMAL LOW (ref 36.0–46.0)
Hemoglobin: 5.5 g/dL — CL (ref 12.0–15.0)
Immature Granulocytes: 1 %
Lymphocytes Relative: 27 %
Lymphs Abs: 2.1 10*3/uL (ref 0.7–4.0)
MCH: 30.2 pg (ref 26.0–34.0)
MCHC: 32.2 g/dL (ref 30.0–36.0)
MCV: 94 fL (ref 80.0–100.0)
Monocytes Absolute: 0.7 10*3/uL (ref 0.1–1.0)
Monocytes Relative: 9 %
Neutro Abs: 4.6 10*3/uL (ref 1.7–7.7)
Neutrophils Relative %: 59 %
Platelets: 96 10*3/uL — ABNORMAL LOW (ref 150–400)
RBC: 1.82 MIL/uL — ABNORMAL LOW (ref 3.87–5.11)
RDW: 15.2 % (ref 11.5–15.5)
WBC: 7.7 10*3/uL (ref 4.0–10.5)
nRBC: 0 % (ref 0.0–0.2)

## 2020-12-23 LAB — PREPARE RBC (CROSSMATCH)

## 2020-12-23 LAB — COMPREHENSIVE METABOLIC PANEL
ALT: 14 U/L (ref 0–44)
AST: 21 U/L (ref 15–41)
Albumin: 2.6 g/dL — ABNORMAL LOW (ref 3.5–5.0)
Alkaline Phosphatase: 104 U/L (ref 38–126)
Anion gap: 11 (ref 5–15)
BUN: 37 mg/dL — ABNORMAL HIGH (ref 8–23)
CO2: 21 mmol/L — ABNORMAL LOW (ref 22–32)
Calcium: 8.5 mg/dL — ABNORMAL LOW (ref 8.9–10.3)
Chloride: 109 mmol/L (ref 98–111)
Creatinine, Ser: 2.32 mg/dL — ABNORMAL HIGH (ref 0.44–1.00)
GFR, Estimated: 21 mL/min — ABNORMAL LOW (ref >60)
Glucose, Bld: 228 mg/dL — ABNORMAL HIGH (ref 70–99)
Potassium: 4.1 mmol/L (ref 3.5–5.1)
Sodium: 141 mmol/L (ref 135–145)
Total Bilirubin: 0.7 mg/dL (ref 0.3–1.2)
Total Protein: 4.7 g/dL — ABNORMAL LOW (ref 6.5–8.1)

## 2020-12-23 LAB — MRSA NEXT GEN BY PCR, NASAL: MRSA by PCR Next Gen: NOT DETECTED

## 2020-12-23 LAB — RESP PANEL BY RT-PCR (FLU A&B, COVID) ARPGX2
Influenza A by PCR: NEGATIVE
Influenza B by PCR: NEGATIVE
SARS Coronavirus 2 by RT PCR: NEGATIVE

## 2020-12-23 LAB — GLUCOSE, CAPILLARY
Glucose-Capillary: 118 mg/dL — ABNORMAL HIGH (ref 70–99)
Glucose-Capillary: 222 mg/dL — ABNORMAL HIGH (ref 70–99)
Glucose-Capillary: 287 mg/dL — ABNORMAL HIGH (ref 70–99)

## 2020-12-23 LAB — POC OCCULT BLOOD, ED: Fecal Occult Bld: POSITIVE — AB

## 2020-12-23 MED ORDER — CHLORHEXIDINE GLUCONATE CLOTH 2 % EX PADS
6.0000 | MEDICATED_PAD | Freq: Every day | CUTANEOUS | Status: DC
  Administered 2020-12-25: 6 via TOPICAL

## 2020-12-23 MED ORDER — POLYETHYLENE GLYCOL 3350 17 G PO PACK: 17.0000 g | PACK | Freq: Every day | ORAL | Status: DC | PRN

## 2020-12-23 MED ORDER — SODIUM CHLORIDE 0.9% IV SOLUTION: Freq: Once | INTRAVENOUS | Status: AC

## 2020-12-23 MED ORDER — CHLORHEXIDINE GLUCONATE CLOTH 2 % EX PADS
6.0000 | MEDICATED_PAD | Freq: Every day | CUTANEOUS | Status: DC
  Administered 2020-12-24 – 2020-12-26 (×2): 6 via TOPICAL

## 2020-12-23 MED ORDER — SODIUM CHLORIDE 0.9 % IV SOLN: 10.0000 mL/h | Freq: Once | INTRAVENOUS | Status: DC

## 2020-12-23 MED ORDER — LEVETIRACETAM 500 MG PO TABS
500.0000 mg | ORAL_TABLET | Freq: Two times a day (BID) | ORAL | Status: DC
  Administered 2020-12-23 – 2020-12-26 (×7): 500 mg via ORAL
  Filled 2020-12-23 (×8): qty 1

## 2020-12-23 MED ORDER — PHENOL 1.4 % MT LIQD
1.0000 | OROMUCOSAL | Status: AC | PRN
  Filled 2020-12-23: qty 177

## 2020-12-23 MED ORDER — SODIUM CHLORIDE 0.9% FLUSH
10.0000 mL | Freq: Two times a day (BID) | INTRAVENOUS | Status: DC
  Administered 2020-12-24: 20 mL
  Administered 2020-12-24 – 2020-12-26 (×4): 10 mL

## 2020-12-23 MED ORDER — PANTOPRAZOLE INFUSION (NEW) - SIMPLE MED
8.0000 mg/h | INTRAVENOUS | Status: DC
  Administered 2020-12-23: 8 mg/h via INTRAVENOUS
  Filled 2020-12-23: qty 80

## 2020-12-23 MED ORDER — PANTOPRAZOLE SODIUM 40 MG IV SOLR: 40.0000 mg | Freq: Two times a day (BID) | INTRAVENOUS | Status: DC

## 2020-12-23 MED ORDER — PANTOPRAZOLE 80MG IVPB - SIMPLE MED
80.0000 mg | Freq: Once | INTRAVENOUS | Status: AC
  Administered 2020-12-23: 80 mg via INTRAVENOUS
  Filled 2020-12-23: qty 80

## 2020-12-23 MED ORDER — SODIUM CHLORIDE 0.9 % IV BOLUS
1000.0000 mL | Freq: Once | INTRAVENOUS | Status: AC
  Administered 2020-12-23: 1000 mL via INTRAVENOUS

## 2020-12-23 MED ORDER — DOCUSATE SODIUM 100 MG PO CAPS: 100.0000 mg | ORAL_CAPSULE | Freq: Two times a day (BID) | ORAL | Status: DC | PRN

## 2020-12-23 MED ORDER — ONDANSETRON HCL 4 MG/2ML IJ SOLN: 4.0000 mg | Freq: Four times a day (QID) | INTRAMUSCULAR | Status: DC | PRN

## 2020-12-23 MED ORDER — LACTATED RINGERS IV BOLUS
1000.0000 mL | Freq: Once | INTRAVENOUS | Status: AC
  Administered 2020-12-23: 1000 mL via INTRAVENOUS

## 2020-12-23 MED ORDER — INSULIN ASPART 100 UNIT/ML IJ SOLN
0.0000 [IU] | INTRAMUSCULAR | Status: DC
Start: 2020-12-23 — End: 2020-12-25
  Administered 2020-12-23: 3 [IU] via SUBCUTANEOUS
  Administered 2020-12-23: 5 [IU] via SUBCUTANEOUS
  Administered 2020-12-24: 3 [IU] via SUBCUTANEOUS
  Administered 2020-12-24: 2 [IU] via SUBCUTANEOUS
  Administered 2020-12-24 – 2020-12-25 (×2): 3 [IU] via SUBCUTANEOUS
  Administered 2020-12-25: 1 [IU] via SUBCUTANEOUS
  Filled 2020-12-23: qty 0.09

## 2020-12-23 MED ORDER — GABAPENTIN 100 MG PO CAPS
100.0000 mg | ORAL_CAPSULE | Freq: Two times a day (BID) | ORAL | Status: DC
  Administered 2020-12-23 – 2020-12-26 (×6): 100 mg via ORAL
  Filled 2020-12-23 (×6): qty 1

## 2020-12-23 MED ORDER — SODIUM CHLORIDE 0.9 % IV SOLN
10.0000 mL/h | Freq: Once | INTRAVENOUS | Status: AC
  Administered 2020-12-23: 10 mL/h via INTRAVENOUS

## 2020-12-23 MED ORDER — SODIUM CHLORIDE 0.9% FLUSH: 10.0000 mL | INTRAVENOUS | Status: DC | PRN

## 2020-12-23 NOTE — H&P (Signed)
NAME:  [name], MRN:  [id number], DOB:  1940/12/16, LOS: 0 ADMISSION DATE:  12/23/2020, CONSULTATION DATE:  11/14 REFERRING MD:  Dr. 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 Other history includes diastolic CHF, pulmonary HTN, and DM. She presented to [facility] ED on 11/14 with complaints of hematemesis x 1 week, which was worse overnight 11/13 prompting presentation. Patient reports using ibuprofen nightly. No alcohol use. Upon arrival to ED she was started on protonix infusion and labs were sent. Hemoglobin with critical at 5.5. She then developed hypotension with SBPs dropping into the 80s. Blood transfusion was ordered and gastroenterology was consulted. In the setting of hemorrhagic shock, PCCM was asked to admit.   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   Significant Hospital Events: Including procedures, antibiotic start and stop dates in addition to other pertinent events     Interim History / Subjective:  No complaints presently.   Objective   Blood pressure (!) 92/46, pulse 71,  temperature 97.9 F (36.6 C), temperature source Oral, resp. rate 16, SpO2 95 %.       No intake or output data in the 24 hours ending 12/23/20 1024 There were no vitals filed for this visit.  Examination: General: Frail elderly appearing female in NAD HENT: [place]/AT, PERRL, no JVD Lungs: Clear bilateral breath sounds, no distress Cardiovascular: RRR, no MRG Abdomen: Soft, non-distended. Pain in LUQ with deep palpation.  Extremities: No acute deformity or ROM limitation Neuro: Drowsy, but easily arouses to verbal and interacts appropriately    Resolved Hospital Problem list     Assessment & Plan:   Acute upper GI bleeding: patient with HHT and history of multiple bleeding AVMs and ulcers. Protonix infusion started in the ED and GI has been consulted.  - Admit to ICU - Transfuse PRBC to keep hgb > 7 (5.5 on admit) - Getting 2 units presently - [facility] consult pending - Serial CBG - PPI - NPO except sips with meds  Hemorrhagic shock - admit to ICU - Transfuse as above - no role for pressors at this time, needs resuscitation with blood product  AKI: secondary to hypovolemic shock - resuscitate as above - trend renal function studies  DM: poorly controlled by A1C - CBG monitoring and SSI while NPO  Seizure disorder - Continue home keppra.  HFpEF HTN - holding home amlodipine, olmesartan, torsemide   Best Practice (right click and "Reselect all SmartList Selections" daily)   Diet/type: NPO w/ oral meds DVT prophylaxis: not indicated GI prophylaxis: PPI Lines: N/A Foley:  N/A Code Status:  full code Last date of multidisciplinary goals of care discussion [ ]   Labs   CBC: Recent Labs  Lab 12/23/20 0752  WBC 7.7  NEUTROABS 4.6  HGB 5.5*  HCT 17.1*  MCV  94.0  PLT 96*    Basic Metabolic Panel: Recent Labs  Lab 12/23/20 0752  NA 141  K 4.1  CL 109  CO2 21*  GLUCOSE 228*  BUN 37*  CREATININE 2.32*  CALCIUM 8.5*   GFR: CrCl cannot be calculated  (Unknown ideal weight.). Recent Labs  Lab 12/23/20 0752  WBC 7.7    Liver Function Tests: Recent Labs  Lab 12/23/20 0752  AST 21  ALT 14  ALKPHOS 104  BILITOT 0.7  PROT 4.7*  ALBUMIN 2.6*   No results for input(s): LIPASE, AMYLASE in the last 168 hours. No results for input(s): AMMONIA in the last 168 hours.    Coagulation Profile: No results for input(s): INR, PROTIME in the last 168 hours.  Cardiac Enzymes: No results for input(s): CKTOTAL, CKMB, CKMBINDEX, TROPONINI in the last 168 hours.  HbA1C: Hemoglobin A1C  Date/Time Value Ref Range Status  11/28/2020 01:39 PM 7.8 (A) 4.0 - 5.6 % Final  08/28/2020 11:39 AM 6.7 (A) 4.0 - 5.6 % Final   Hgb A1c MFr Bld  Date/Time Value Ref Range Status  10/31/2019 11:44 AM 8.4 (H) 4.6 - 6.5 % Final    Comment:    Glycemic Control Guidelines for People with Diabetes:Non Diabetic:  <6%Goal of Therapy: <7%Additional Action Suggested:  >8%   06/26/2019 02:07 PM 5.6 4.6 - 6.5 % Final    Comment:    Glycemic Control Guidelines for People with Diabetes:Non Diabetic:  <6%Goal of Therapy: <7%Additional Action Suggested:  >8%     CBG: No results for input(s): GLUCAP in the last 168 hours.  Review of Systems:   Bolds are positive  Constitutional: weight loss, gain, night sweats, Fevers, chills, fatigue .  HEENT: headaches, Sore throat, sneezing, nasal congestion, post nasal drip, Difficulty swallowing, Tooth/dental problems, visual complaints visual changes, ear ache CV:  chest pain, radiates:,Orthopnea, PND, swelling in lower extremities, dizziness, palpitations, syncope.  GI  heartburn, indigestion, abdominal pain, nausea, vomiting bloody, diarrhea, change in bowel habits, loss of appetite, bloody stools (melena).  Resp: cough, productive: , hemoptysis, dyspnea, chest pain, pleuritic.  Skin: rash or itching or icterus GU: dysuria, change in color of urine, urgency or frequency. flank pain, hematuria  MS: joint pain or swelling.  decreased range of motion  Psych: change in mood or affect. depression or anxiety.  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   Surgical History:   Past Surgical History:  Procedure Laterality Date   CATARACT EXTRACTION     "I think it was just one eye"   ESOPHAGOGASTRODUODENOSCOPY  02/26/2011   Procedure: ESOPHAGOGASTRODUODENOSCOPY (EGD);  Surgeon: [name], MD;  Location: [name];  Service: Endoscopy;  Laterality: N/A;   ESOPHAGOGASTRODUODENOSCOPY N/A 11/08/2012   Procedure: ESOPHAGOGASTRODUODENOSCOPY (EGD);  Surgeon: [name], MD;  Location: [name];  Service: Endoscopy;  Laterality: N/A;   ESOPHAGOGASTRODUODENOSCOPY N/A 10/04/2013   Procedure: ESOPHAGOGASTRODUODENOSCOPY (EGD);  Surgeon: Winfield Cunas., MD;  Location: [name];  Service: Endoscopy;  Laterality: N/A;  with APC on stand-by   ESOPHAGOGASTRODUODENOSCOPY N/A 07/06/2014   Procedure: ESOPHAGOGASTRODUODENOSCOPY (EGD);  Surgeon: Clarene Essex, MD;  Location: [name];  Service: Endoscopy;  Laterality: N/A;   ESOPHAGOGASTRODUODENOSCOPY N/A 09/05/2014   Procedure: ESOPHAGOGASTRODUODENOSCOPY (EGD);  Surgeon: Laurence Spates, MD;  Location: [name];  Service: Endoscopy;  Laterality: N/A;  APC  on standby to control bleeding   ESOPHAGOGASTRODUODENOSCOPY N/A 11/29/2014   Procedure: ESOPHAGOGASTRODUODENOSCOPY (EGD);  Surgeon: [name], MD;  Location: [facility] ENDOSCOPY;  Service: Endoscopy;  Laterality: N/A;   ESOPHAGOGASTRODUODENOSCOPY N/A 09/28/2015   Procedure: ESOPHAGOGASTRODUODENOSCOPY (EGD);  Surgeon: Clarene Essex, MD;  Location[facility];  Service: Endoscopy;  Laterality: N/A;   ESOPHAGOGASTRODUODENOSCOPY (EGD) WITH PROPOFOL N/A 12/04/2017   Procedure: ESOPHAGOGASTRODUODENOSCOPY (EGD) WITH PROPOFOL;  Surgeon: [name], MD;  Location: [facility];  Service: Endoscopy;  Laterality: N/A;   ESOPHAGOGASTRODUODENOSCOPY ENDOSCOPY  08/19/2006   with laser treatment   HOT HEMOSTASIS N/A 07/06/2014   Procedure: HOT HEMOSTASIS (ARGON PLASMA COAGULATION/BICAP);  Surgeon: Clarene Essex, MD;  Location: [name];  Service: Endoscopy;  Laterality: N/A;   HOT HEMOSTASIS N/A 09/28/2015   Procedure: HOT HEMOSTASIS (ARGON PLASMA COAGULATION/BICAP);  Surgeon: Clarene Essex, MD;  Location: [facility] ENDOSCOPY;  Service: Endoscopy;  Laterality: N/A;   HOT HEMOSTASIS N/A 12/04/2017   Procedure: HOT HEMOSTASIS (ARGON PLASMA COAGULATION/BICAP);  Surgeon: [name], MD;  Location: [facility];  Service: Endoscopy;  Laterality: N/A;   NASAL HEMORRHAGE CONTROL     "for bleeding"    SAVORY DILATION  02/26/2011   Procedure: SAVORY DILATION;  Surgeon: [name], MD;  Location: [name];  Service: Endoscopy;  Laterality: N/A;  c-arm needed   SUBMUCOSAL INJECTION  12/04/2017   Procedure: SUBMUCOSAL INJECTION;  Surgeon: [name], MD;  Location: [facility];  Service: Endoscopy;;     Social History:   reports that she quit smoking about 49 years ago. Her smoking use included cigarettes. She has a 20.00 pack-year smoking history. She has never used smokeless tobacco. She reports that she does not drink alcohol and does not use drugs.   Family History:  Her family history includes  Breast cancer in an other family member; Diabetes in her niece; Healthy in her mother; Stroke in her father. There is no history of Malignant hyperthermia or Seizures.   Allergies Allergies  Allergen Reactions   Aspirin Nausea And Vomiting    Other reaction(s): Other (See Comments)     Home Medications  Prior to Admission medications   Medication Sig Start Date End Date Taking? Authorizing Provider  amLODipine (NORVASC) 10 MG tablet TAKE 1 TABLET BY MOUTH EVERY MORNING Patient taking differently: Take 10 mg by mouth daily. 10/07/20  Yes [name], Areeg N, DO  donepezil (ARICEPT) 10 MG tablet TAKE 1 TABLET BY MOUTH AT BEDTIME Patient taking differently: Take 10 mg by mouth at bedtime. 09/07/20  Yes [name], Areeg N, DO  gabapentin (NEURONTIN) 100 MG capsule TAKE 1 CAPSULE BY MOUTH TWICE DAILY Patient taking differently: Take 100 mg by mouth 2 (two) times daily. 08/16/20  Yes Katsadouros, Vasilios, MD  HUMALOG MIX 75/25 KWIKPEN (75-25) 100 UNIT/ML Kwikpen INJECT 10 UNITS SUBCUTANEOUSLY ONCE DAILY Patient taking differently: Inject 8 Units into the skin daily. 04/17/20  Yes Elayne Snare, MD  insulin lispro (HUMALOG KWIKPEN) 100 UNIT/ML KwikPen 3 units before dinner Patient taking differently: Inject 3 Units into the skin daily before supper. 3 units before dinner 10/03/20  Yes Elayne Snare, MD  levETIRAcetam (KEPPRA) 500 MG  tablet TAKE 1 TABLET BY MOUTH TWICE DAILY Patient taking differently: Take 500 mg by mouth 2 (two) times daily. 06/07/20  Yes [name], Areeg N, DO  memantine (NAMENDA) 10 MG tablet TAKE 1 TABLET BY MOUTH TWICE DAILY Patient taking differently: Take 10 mg by mouth 2 (two) times daily. 11/11/20  Yes [name], Areeg N, DO  Misc Natural Products (IMMUNE FORMULA PO) Take 1 tablet by mouth daily.   Yes [provider]  Multiple Vitamins-Minerals (MULTI FOR HER 50+) TABS Take 1 tablet by mouth daily.   Yes [provider]  olmesartan (BENICAR) 20 MG tablet Take 10 mg by mouth  daily. 09/26/20  Yes [provider]  Potassium Chloride ER 20 MEQ TBCR Take 20 mEq by mouth 2 (two) times daily. 01/02/20  Yes Madalyn Rob, MD  triamcinolone ointment (KENALOG) 0.5 % APPLY 1 APPLICATION TOPICALLY TO RASH TWICE DAILY FOR ITCHING Patient taking differently: Apply 1 application topically 2 (two) times daily as needed (itching). 08/16/20  Yes Katsadouros, Vasilios, MD  TRULICITY 1.5 OE/4.2PN SOPN INJECT 1.5MG  SUBCUTANEOUSLY EVERY WEEK ON THURSDAY Patient taking differently: Inject 1.5 mg into the skin once a week. ON THURSDAY 07/29/20  Yes Elayne Snare, MD  ACCU-CHEK GUIDE test strip USE AS INSTRUCTED TO CHECK BLOOD SUGAR 3 TIMES DAILY. 08/08/20   Elayne Snare, MD  Accu-Chek Softclix Lancets lancets 1 each by Other route 3 (three) times daily. Use as instructed to check blood sugar 3 times per day dx code E11.65 02/01/20   Elayne Snare, MD  Continuous Blood Gluc Sensor (FREESTYLE LIBRE 2 SENSOR) MISC 2 Devices by Does not apply route every 14 (fourteen) days. 04/25/20   Elayne Snare, MD  Elastic Bandages & Supports ([place]) [place] Wear as much as possible while awake to reduce swelling 04/27/16   Minus Liberty, MD  glucose blood (ACCU-CHEK GUIDE) test strip USE AS INSTRUCTED TO CHECK BLOOD SUGAR 3 TIMES DAILY. 07/13/20   [provider]  Insulin Pen Needle (B-D UF III MINI PEN NEEDLES) 31G X 5 MM MISC USE TO INJECT INSULIN THREE TIMES A DAY 12/23/20   Elayne Snare, MD  Lancets MISC 1 each by Other route. 02/01/20   [provider]  torsemide (DEMADEX) 20 MG tablet Take 1 tablet (20 mg total) by mouth daily. Patient not taking: Reported on 12/23/2020 01/02/20   Madalyn Rob, MD     Critical care time: 38 minutes necessary due to hemorrhagic shock    Georgann Housekeeper, AGACNP-BC [place] for personal pager PCCM on call pager [phone] until 7pm. Please call Elink 7p-7a. [phone]  12/23/2020 10:43  AM       ,

## 2020-12-23 NOTE — Progress Notes (Signed)
   12/23/20 1745  Clinical Encounter Type  Visited With Other (Comment)  Visit Type Initial  Referral From Nurse  Consult/Referral To Chaplain   Attending nurse called requesting Bible for the patient. Will follow up.This note was prepared by Jeanine Luz, M.Div..  For questions please contact by phone [phone].

## 2020-12-23 NOTE — Progress Notes (Signed)
[facility] Progress Note Patient Name: GEORGIANNE GRITZ DOB: Jan 13, 1941 MRN: [id number]   Date of Service  12/23/2020  HPI/Events of Note  Patient is NPO and has a sore throat.  eICU Interventions  PRN chloraseptic spray ordered.        Kerry Kass Lexus Shampine 12/23/2020, 10:11 PM

## 2020-12-23 NOTE — Progress Notes (Signed)
Afternoon follow-up.  Interval: Patient now arrived at [facility], hemodynamically stable.  Does have visual Hand tremor, but no tachycardia, shortness of breath, anxiety, or hypotension.  No further clear blood loss, no mental status changes.  Today she is received 1 unit of blood at [facility] emergency room, she is receiving her second unit right now.  Her initial hemoglobin was 5.5, we do not have follow-up hemoglobin today.  She is awake oriented and denies any complaints.  She has been seen by GI.  Has history of HHT with AVMs and ulcers she has had multiple prior EGDs.  The recommendation was to transfuse and then proceed with EGD on 11/15  Exam Pleasant 80 year old female resting in bed no acute distress currently HEENT normocephalic atraumatic no jugular venous distention Pulmonary clear to auscultation currently room air without accessory use Cardiac regular rhythm, occasional PAC, current heart rate in the 60s to 70s Abdomen soft not tender to palpation Extremities warm dry Neuro intact, does have upper extremity tremor, I am not sure of the chronicity of this GU has not voided since admission  Impression/plan Upper GI bleed, has significant history of AVMs and ulcers in the past Plan N.p.o. Transfuse, follow-up CBC PPI infusion Endoscopy on 11/15  Erick Colace ACNP-BC [place] Pager # [phone] OR # [phone] if no answer

## 2020-12-23 NOTE — Progress Notes (Signed)
   12/23/20 1845  Clinical Encounter Type  Visited With Patient and family together  Visit Type Initial;Spiritual support  Referral From Chaplain  Consult/Referral To [place] text;Prayer   Chaplain responded to referral passed on by day chaplain. Pt's goddaughter, Lawerance Bach, was at bedside. Chaplain brought Pt a large print Bible. Chaplain also prayed with Pt and family. Chaplain remains available.  This note was prepared by Marijo File, MDiv. Chaplain remains available as needed through the on-call pager: [phone].

## 2020-12-23 NOTE — H&P (View-Only) (Signed)
Referring Provider: Dr. Lennice Sites Primary Care Physician:  Mike Craze, DO Primary Gastroenterologist:  Dr. Watt Climes  Reason for Consultation:  Hematemesis  HPI: [name] is a 80 y.o. 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 States that the hematemesis began 1 week ago.  She states she has 2 episodes of vomiting per day.  States some of her emesis is nonbloody and some of that has large amounts of blood.  Admits her symptoms are similar to the last time she had bleeding.  She denies any abdominal pain, chest pain, shortness of breath, nausea, vomiting, constipation, diarrhea, reflux symptoms,hematochezia and melena.  States she uses 1 tablet of ibuprofen per week.  She denies any other NSAID use.  Denies weight loss but admits to poor appetite.  Denies any hematemesis since previous EGD in 2019.  Patient denies use of blood thinners.  Denies family history of colon cancer.  She has had 8 previous EGDs.  All were significant for multiple AVMs found. Most recent: EGD 12/04/2017 - LA Grade C reflux esophagitis. - Z-line, 35 cm from the incisors. - Oozing gastric ulcer with pigmented material. Injected. Clip was placed. - A single bleeding angiodysplastic lesion in the stomach. Treated with argon plasma coagulation (APC). - Normal examined duodenum. - Acute gastritis. - Small hiatal hernia. - No specimens collected.  EGD 09/28/2015 - Tiny hiatal hernia. - A single non-bleeding avm in the middle third of the esophagus. Treated with argon plasma coagulation (APC). 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 Treated with argon plasma coagulation (APC). - A single non-bleeding angiodysplastic lesion in the duodenum. - Normal second portion of the duodenum. - An endoclip was found in the stomach. - Enlarged gastric folds. - The examination was otherwise normal. - No specimens collected.     Past Medical  History:  Diagnosis Date  . Chronic anemia   . Chronic diastolic CHF (congestive heart failure) ([place]) 10/03/2013  . Chronic GI bleeding    [name] 11/29/2014  . Family history of anesthesia complication    "niece has a hard time coming out" (09/15/2012)  . Frequent nosebleeds    chronic  . Gastric AV malformation    [name] 11/29/2014  . GERD (gastroesophageal reflux disease)   . 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 History of blood transfusion "several"  . History of epistaxis   . HTN (hypertension), benign 03/02/2012  . Hyperlipidemia   . Iron deficiency anemia    chronic infusions"  . Lichen planus    Both lower extremities  . 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 Overgrown toenails 03/18/2017  . Pneumonia 1990's X 2  . Pulmonary HTN ([place]) 04/01/2017   PASP 55mmHg on echo 09/2016 and 51mmHg by echo 2019  . Seizures ([place]) 09/2014  . Symptomatic anemia 11/29/2014  . Telangiectasia    Gastric   . 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    Past Surgical History:  Procedure Laterality Date  . CATARACT EXTRACTION     "I think it was just one eye"  . ESOPHAGOGASTRODUODENOSCOPY  02/26/2011   Procedure: ESOPHAGOGASTRODUODENOSCOPY (EGD);  Surgeon: [name], MD;  Location: [name];  Service: Endoscopy;  Laterality: N/A;  . ESOPHAGOGASTRODUODENOSCOPY N/A 11/08/2012   Procedure: ESOPHAGOGASTRODUODENOSCOPY (EGD);  Surgeon: [name], MD;  Location: [name];  Service: Endoscopy;  Laterality: N/A;  .  ESOPHAGOGASTRODUODENOSCOPY N/A 10/04/2013   Procedure: ESOPHAGOGASTRODUODENOSCOPY (EGD);  Surgeon: Winfield Cunas., MD;  Location: [name];  Service: Endoscopy;  Laterality: N/A;  with APC on stand-by  . ESOPHAGOGASTRODUODENOSCOPY N/A 07/06/2014   Procedure: ESOPHAGOGASTRODUODENOSCOPY (EGD);  Surgeon: Clarene Essex, MD;  Location: [name];  Service: Endoscopy;  Laterality: N/A;  . ESOPHAGOGASTRODUODENOSCOPY N/A  09/05/2014   Procedure: ESOPHAGOGASTRODUODENOSCOPY (EGD);  Surgeon: Laurence Spates, MD;  Location: [name];  Service: Endoscopy;  Laterality: N/A;  APC on standby to control bleeding  . ESOPHAGOGASTRODUODENOSCOPY N/A 11/29/2014   Procedure: ESOPHAGOGASTRODUODENOSCOPY (EGD);  Surgeon: [name], MD;  Location: [facility] ENDOSCOPY;  Service: Endoscopy;  Laterality: N/A;  . ESOPHAGOGASTRODUODENOSCOPY N/A 09/28/2015   Procedure: ESOPHAGOGASTRODUODENOSCOPY (EGD);  Surgeon: Clarene Essex, MD;  Location: [facility] ENDOSCOPY;  Service: Endoscopy;  Laterality: N/A;  . ESOPHAGOGASTRODUODENOSCOPY (EGD) WITH PROPOFOL N/A 12/04/2017   Procedure: ESOPHAGOGASTRODUODENOSCOPY (EGD) WITH PROPOFOL;  Surgeon: [name], MD;  Location: [place];  Service: Endoscopy;  Laterality: N/A;  . ESOPHAGOGASTRODUODENOSCOPY ENDOSCOPY  08/19/2006   with laser treatment  . HOT HEMOSTASIS N/A 07/06/2014   Procedure: HOT HEMOSTASIS (ARGON PLASMA COAGULATION/BICAP);  Surgeon: Clarene Essex, MD;  Location: [name];  Service: Endoscopy;  Laterality: N/A;  . HOT HEMOSTASIS N/A 09/28/2015   Procedure: HOT HEMOSTASIS (ARGON PLASMA COAGULATION/BICAP);  Surgeon: Clarene Essex, MD;  Location: [facility] ENDOSCOPY;  Service: Endoscopy;  Laterality: N/A;  . HOT HEMOSTASIS N/A 12/04/2017   Procedure: HOT HEMOSTASIS (ARGON PLASMA COAGULATION/BICAP);  Surgeon: [name], MD;  Location: [place];  Service: Endoscopy;  Laterality: N/A;  . NASAL HEMORRHAGE CONTROL     "for bleeding"   . SAVORY DILATION  02/26/2011   Procedure: SAVORY DILATION;  Surgeon: [name], MD;  Location: [name];  Service: Endoscopy;  Laterality: N/A;  c-arm needed  . SUBMUCOSAL INJECTION  12/04/2017   Procedure: SUBMUCOSAL INJECTION;  Surgeon: [name], MD;  Location: [place];  Service: Endoscopy;;    Prior to Admission medications   Medication Sig Start Date End Date Taking? Authorizing Provider  amLODipine (NORVASC) 10 MG tablet TAKE 1  TABLET BY MOUTH EVERY MORNING Patient taking differently: Take 10 mg by mouth daily. 10/07/20  Yes [name], Areeg N, DO  donepezil (ARICEPT) 10 MG tablet TAKE 1 TABLET BY MOUTH AT BEDTIME Patient taking differently: Take 10 mg by mouth at bedtime. 09/07/20  Yes [name], Areeg N, DO  gabapentin (NEURONTIN) 100 MG capsule TAKE 1 CAPSULE BY MOUTH TWICE DAILY Patient taking differently: Take 100 mg by mouth 2 (two) times daily. 08/16/20  Yes Katsadouros, Vasilios, MD  HUMALOG MIX 75/25 KWIKPEN (75-25) 100 UNIT/ML Kwikpen INJECT 10 UNITS SUBCUTANEOUSLY ONCE DAILY Patient taking differently: Inject 8 Units into the skin daily. 04/17/20  Yes Elayne Snare, MD  insulin lispro (HUMALOG KWIKPEN) 100 UNIT/ML KwikPen 3 units before dinner Patient taking differently: Inject 3 Units into the skin daily before supper. 3 units before dinner 10/03/20  Yes Elayne Snare, MD  levETIRAcetam (KEPPRA) 500 MG tablet TAKE 1 TABLET BY MOUTH TWICE DAILY Patient taking differently: Take 500 mg by mouth 2 (two) times daily. 06/07/20  Yes [name], Areeg N, DO  memantine (NAMENDA) 10 MG tablet TAKE 1 TABLET BY MOUTH TWICE DAILY Patient taking differently: Take 10 mg by mouth 2 (two) times daily. 11/11/20  Yes [name], Areeg N, DO  Misc Natural Products (IMMUNE FORMULA PO) Take 1 tablet by mouth daily.   Yes [provider]  Multiple Vitamins-Minerals (MULTI FOR HER 50+) TABS Take 1 tablet by  mouth daily.   Yes [provider]  olmesartan (BENICAR) 20 MG tablet Take 10 mg by mouth daily. 09/26/20  Yes [provider]  Potassium Chloride ER 20 MEQ TBCR Take 20 mEq by mouth 2 (two) times daily. 01/02/20  Yes Madalyn Rob, MD  triamcinolone ointment (KENALOG) 0.5 % APPLY 1 APPLICATION TOPICALLY TO RASH TWICE DAILY FOR ITCHING Patient taking differently: Apply 1 application topically 2 (two) times daily as needed (itching). 08/16/20  Yes Katsadouros, Vasilios, MD  TRULICITY 1.5 HM/0.9OB SOPN INJECT 1.5MG  SUBCUTANEOUSLY  EVERY WEEK ON THURSDAY Patient taking differently: Inject 1.5 mg into the skin once a week. ON THURSDAY 07/29/20  Yes Elayne Snare, MD  ACCU-CHEK GUIDE test strip USE AS INSTRUCTED TO CHECK BLOOD SUGAR 3 TIMES DAILY. 08/08/20   Elayne Snare, MD  Accu-Chek Softclix Lancets lancets 1 each by Other route 3 (three) times daily. Use as instructed to check blood sugar 3 times per day dx code E11.65 02/01/20   Elayne Snare, MD  Continuous Blood Gluc Sensor (FREESTYLE LIBRE 2 SENSOR) MISC 2 Devices by Does not apply route every 14 (fourteen) days. 04/25/20   Elayne Snare, MD  Elastic Bandages & Supports ([place]) [place] Wear as much as possible while awake to reduce swelling 04/27/16   Minus Liberty, MD  glucose blood (ACCU-CHEK GUIDE) test strip USE AS INSTRUCTED TO CHECK BLOOD SUGAR 3 TIMES DAILY. 07/13/20   [provider]  Insulin Pen Needle (B-D UF III MINI PEN NEEDLES) 31G X 5 MM MISC USE TO INJECT INSULIN THREE TIMES A DAY 12/23/20   Elayne Snare, MD  Lancets MISC 1 each by Other route. 02/01/20   [provider]  torsemide (DEMADEX) 20 MG tablet Take 1 tablet (20 mg total) by mouth daily. Patient not taking: Reported on 12/23/2020 01/02/20   Madalyn Rob, MD    Scheduled Meds: . Derrill Memo ON 12/26/2020] pantoprazole  40 mg Intravenous Q12H   Continuous Infusions: . sodium chloride    . pantoprazole 8 mg/hr (12/23/20 0831)   PRN Meds:.  Allergies as of 12/23/2020 - Review Complete 12/23/2020  Allergen Reaction Noted  . Aspirin Nausea And Vomiting 05/12/2006    Family History  Problem Relation Age of Onset  . Stroke Father   . Healthy Mother   . Breast cancer Other   . Diabetes Niece   . Malignant hyperthermia Neg Hx   . Seizures Neg Hx     Social History   Socioeconomic History  . Marital status: Married    Spouse name: [name]  . Number of children: 0  . Years of education: 9  . Highest education level: Not on file  Occupational History  .  Occupation: Retired Systems developer: RETIRED  Tobacco Use  . Smoking status: Former    Packs/day: 1.00    Years: 20.00    Pack years: 20.00    Types: Cigarettes    Quit date: 02/10/1971    Years since quitting: 49.9  . Smokeless tobacco: Never  . Tobacco comments:    09/15/2012 "smoked 50-60 yr ago"  Vaping Use  . Vaping Use: Never used  Substance and Sexual Activity  . Alcohol use: No    Alcohol/week: 0.0 standard drinks  . Drug use: No  . Sexual activity: Not on file  Other Topics Concern  . Not on file  Social History Narrative   Married, lives with husband, [name].  Ambulates without assistance.     Caffeine use: none  Social Determinants of Health   Financial Resource Strain: Not on file  Food Insecurity: Not on file  Transportation Needs: Not on file  Physical Activity: Not on file  Stress: Not on file  Social Connections: Not on file  Intimate Partner Violence: Not on file    Review of Systems:  Review of Systems  Constitutional:  Negative for chills, diaphoresis, fever, malaise/fatigue and weight loss.  HENT:  Negative for hearing loss and sore throat.   Eyes:  Negative for blurred vision and double vision.  Respiratory:  Negative for cough and hemoptysis.   Cardiovascular:  Negative for chest pain and palpitations.  Gastrointestinal:  Positive for vomiting. Negative for abdominal pain, blood in stool, constipation, diarrhea, heartburn, melena and nausea.  Genitourinary:  Negative for dysuria and urgency.  Musculoskeletal:  Negative for back pain and joint pain.  Skin:  Negative for rash.  Neurological:  Negative for dizziness and headaches.  Psychiatric/Behavioral:  Negative for depression. The patient is not nervous/anxious.    Physical Exam: Vital signs: Vitals:   12/23/20 0941 12/23/20 1007  BP: (!) 86/28 (!) 92/46  Pulse: 63 71  Resp: 16 16  Temp: 97.9 F (36.6 C)   SpO2: 96% 95%     General:   Alert, in NAD Heart:  Regular rate and  rhythm; no murmurs Pulm: Clear anteriorly; no wheezing Abdomen:  Soft, Obese AB, skin exam normal, Normal bowel sounds. mild tenderness in the epigastrium. Without guarding and Without rebound, without organomegaly. Extremities:  Without edema. Neurologic:  Alert and  oriented x4;  grossly normal neurologically. Psych:  Alert and cooperative. Normal mood and affect.  GI:  Lab Results: Recent Labs    12/23/20 0752  WBC 7.7  HGB 5.5*  HCT 17.1*  PLT 96*   BMET Recent Labs    12/23/20 0752  NA 141  K 4.1  CL 109  CO2 21*  GLUCOSE 228*  BUN 37*  CREATININE 2.32*  CALCIUM 8.5*   LFT Recent Labs    12/23/20 0752  PROT 4.7*  ALBUMIN 2.6*  AST 21  ALT 14  ALKPHOS 104  BILITOT 0.7   PT/INR No results for input(s): LABPROT, INR in the last 72 hours.  Studies/Results: No results found.  Impression Hematemesis Possibly upper GI bleed due to AVMs or bleeding ulcer with previous EGD 2019.   Elevated BUN and her previous history suggest upper GI in origin.  Anemia  WBC 7.7 HGB 5.5 (11.8 on 11/27/2020) MCV 94.0 Platelets 96 BUN 37 (25 on 11/27/2020)   CR 2.32 (2.06 on 11/27/2020)  Iron 60 Ferritin 377 B12 1,615  S/p 1 unit PBRs getting at this time.  likely upper GI bleed given elevated BUN and previous history of gastric AVMs and bleeding ulcers.  Hypotension BP 90/40 while in the room No CP or SOB Likely from volume depletion, getting blood now, will need to repeat vitals prior to EGD tomorrow    Plan Plan for EGD tomorrow. I thoroughly discussed the procedure to include nature, alternatives, benefits, and risks including but not limited to bleeding, perforation, infection, anesthesia/cardiac and pulmonary complications. Patient provides understanding and gave verbal consent to proceed.   Protonix 40 mg IV BID.   Clear liquid diet, NPO at midnight.   Continue daily CBC with transfusion as needed to maintain Hgb >7.   Counseled patient on refraining from  NSAIDs due to history of bleeding gastric ulcers.  Will repeat vitals before EGD tomorrow.  [facility] will follow.  LOS: 0 days   Vladimir Crofts  PA-C 12/23/2020, 10:20 AM  Contact #  [phone]

## 2020-12-23 NOTE — ED Provider Notes (Addendum)
[facility] DEPT Provider Note   CSN: [id number] Arrival date & time: 12/23/20  3762     History Chief Complaint  Patient presents with   Hematemesis    [name] is a 80 y.o. female.  The history is provided by the patient.  Emesis Severity:  Mild Duration:  1 week Timing:  Intermittent Quality:  Unable to specify Chronicity:  Recurrent Relieved by:  Nothing Worsened by:  Nothing Associated symptoms: no abdominal pain, no arthralgias, no chills, no cough, no diarrhea, no fever, no headaches, no myalgias, no sore throat and no URI   Risk factors comment:  Hx of GI bleeds AVMs/ulcer     Past Medical History:  Diagnosis Date   Chronic anemia    Chronic diastolic CHF (congestive heart failure) ([place]) 10/03/2013   Chronic GI bleeding    /notes 11/29/2014   Family history of anesthesia complication    "niece has a hard time coming out" (09/15/2012)   Frequent nosebleeds    chronic   Gastric AV malformation    /notes 11/29/2014   GERD (gastroesophageal reflux disease)    Heart murmur 04/01/2017   Moderate AVSC on echo 09/2016   History of blood transfusion "several"   History of epistaxis    HTN (hypertension), benign 03/02/2012   Hyperlipidemia    Iron deficiency anemia    chronic infusions"   Lichen planus    Both lower extremities   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    Patient Active Problem List   Diagnosis Date Noted   Bullous lesion 10/08/2020   Hypoglycemia 09/29/2020   Impacted cerumen of left ear 07/15/2020   Hearing loss 05/29/2020   Abnormal LFTs 08/09/2019   Hypervolemia due to congestive heart failure ([place])  08/02/2019   Neuropathy 07/27/2019   Hyperkalemia 10/18/2018   Port-A-Cath in place 05/23/2018   Iron deficiency anemia due to chronic blood loss 04/19/2018   Acute right hip pain 03/10/2018   Hyperpigmented skin lesion 02/07/2018   Leg weakness, bilateral 12/23/2017   Hematemesis 12/04/2017   Pain localized to upper abdomen 10/05/2017   Lower extremity pain, bilateral 07/15/2017   Pulmonary HTN ([place]) 04/01/2017   Heart murmur 04/01/2017   Overgrown toenails 03/18/2017   Osteopenia after menopause 01/13/2017   Epistaxis 07/30/2016   Venous insufficiency of both lower extremities 04/27/2016   Anemia due to chronic blood loss    Dry skin 02/17/2016   Peripheral vascular disease ([place]) 12/30/2015   Pain in joint of right shoulder 10/22/2015   PAF (paroxysmal atrial fibrillation) ([place]) 83/15/1761   Lichen planus 60/73/7106   Healthcare maintenance 06/20/2015   Seborrheic keratosis 06/20/2015   Memory loss 01/30/2015   Simple febrile convulsions ([place]) 10/21/2014   Hyperammonemia ([place]) 10/03/2014   CKD (chronic kidney disease), stage III ([name]    Seizures ([place]) 09/2014   Chronic diastolic CHF (congestive heart failure) ([place]) 10/03/2013   Acute on chronic heart failure with preserved ejection fraction (HFpEF) ([place]) 10/03/2013   DM (diabetes mellitus), type 2, uncontrolled, with renal complications 26/94/8546   HTN (hypertension), benign 03/02/2012   Fatigue 07/10/2011   Gastric AVM  02/01/2011   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    Past Surgical History:  Procedure Laterality Date   CATARACT EXTRACTION     "I think it was just one eye"   ESOPHAGOGASTRODUODENOSCOPY  02/26/2011   Procedure: ESOPHAGOGASTRODUODENOSCOPY (EGD);  Surgeon: [name], MD;  Location: [name];  Service: Endoscopy;  Laterality: N/A;   ESOPHAGOGASTRODUODENOSCOPY N/A 11/08/2012   Procedure: ESOPHAGOGASTRODUODENOSCOPY (EGD);  Surgeon: [name], MD;  Location: [name];   Service: Endoscopy;  Laterality: N/A;   ESOPHAGOGASTRODUODENOSCOPY N/A 10/04/2013   Procedure: ESOPHAGOGASTRODUODENOSCOPY (EGD);  Surgeon: Winfield Cunas., MD;  Location: [name];  Service: Endoscopy;  Laterality: N/A;  with APC on stand-by   ESOPHAGOGASTRODUODENOSCOPY N/A 07/06/2014   Procedure: ESOPHAGOGASTRODUODENOSCOPY (EGD);  Surgeon: Clarene Essex, MD;  Location: [name];  Service: Endoscopy;  Laterality: N/A;   ESOPHAGOGASTRODUODENOSCOPY N/A 09/05/2014   Procedure: ESOPHAGOGASTRODUODENOSCOPY (EGD);  Surgeon: Laurence Spates, MD;  Location: [name];  Service: Endoscopy;  Laterality: N/A;  APC on standby to control bleeding   ESOPHAGOGASTRODUODENOSCOPY N/A 11/29/2014   Procedure: ESOPHAGOGASTRODUODENOSCOPY (EGD);  Surgeon: [name], MD;  Location: [facility] ENDOSCOPY;  Service: Endoscopy;  Laterality: N/A;   ESOPHAGOGASTRODUODENOSCOPY N/A 09/28/2015   Procedure: ESOPHAGOGASTRODUODENOSCOPY (EGD);  Surgeon: Clarene Essex, MD;  Location: [facility] ENDOSCOPY;  Service: Endoscopy;  Laterality: N/A;   ESOPHAGOGASTRODUODENOSCOPY (EGD) WITH PROPOFOL N/A 12/04/2017   Procedure: ESOPHAGOGASTRODUODENOSCOPY (EGD) WITH PROPOFOL;  Surgeon: [name], MD;  Location: [facility];  Service: Endoscopy;  Laterality: N/A;   ESOPHAGOGASTRODUODENOSCOPY ENDOSCOPY  08/19/2006   with laser treatment   HOT HEMOSTASIS N/A 07/06/2014   Procedure: HOT HEMOSTASIS (ARGON PLASMA COAGULATION/BICAP);  Surgeon: Clarene Essex, MD;  Location: [name];  Service: Endoscopy;  Laterality: N/A;   HOT HEMOSTASIS N/A 09/28/2015   Procedure: HOT HEMOSTASIS (ARGON PLASMA COAGULATION/BICAP);  Surgeon: Clarene Essex, MD;  Location[facility] ENDOSCOPY;  Service: Endoscopy;  Laterality: N/A;   HOT HEMOSTASIS N/A 12/04/2017   Procedure: HOT HEMOSTASIS (ARGON PLASMA COAGULATION/BICAP);  Surgeon: [name], MD;  Location: [facility];  Service: Endoscopy;  Laterality: N/A;   NASAL HEMORRHAGE CONTROL     "for bleeding"    SAVORY  DILATION  02/26/2011   Procedure: SAVORY DILATION;  Surgeon: [name], MD;  Location: [name];  Service: Endoscopy;  Laterality: N/A;  c-arm needed   SUBMUCOSAL INJECTION  12/04/2017   Procedure: SUBMUCOSAL INJECTION;  Surgeon: [name], MD;  Location[facility];  Service: Endoscopy;;     OB History   No obstetric history on file.     Family History  Problem Relation Age of Onset   Stroke Father    Healthy Mother    Breast cancer Other    Diabetes Niece    Malignant hyperthermia Neg Hx    Seizures Neg Hx     Social History   Tobacco Use   Smoking status: Former    Packs/day: 1.00    Years: 20.00    Pack years: 20.00    Types: Cigarettes    Quit date: 02/10/1971    Years since quitting: 49.9   Smokeless tobacco: Never   Tobacco comments:    09/15/2012 "smoked 50-60 yr ago"  Vaping Use   Vaping Use: Never used  Substance Use Topics   Alcohol use: No    Alcohol/week: 0.0 standard drinks   Drug use: No    Home Medications Prior to Admission medications   Medication Sig Start Date End Date Taking? Authorizing Provider  amLODipine (NORVASC) 10 MG tablet  TAKE 1 TABLET BY MOUTH EVERY MORNING 10/07/20   [name], Areeg N, DO  memantine (NAMENDA) 10 MG tablet TAKE 1 TABLET BY MOUTH TWICE DAILY 11/11/20   [name], Areeg N, DO  ACCU-CHEK GUIDE test strip USE AS INSTRUCTED TO CHECK BLOOD SUGAR 3 TIMES DAILY. 08/08/20   Elayne Snare, MD  Accu-Chek Softclix Lancets lancets 1 each by Other route 3 (three) times daily. Use as instructed to check blood sugar 3 times per day dx code E11.65 02/01/20   Elayne Snare, MD  Continuous Blood Gluc Sensor (FREESTYLE LIBRE 2 SENSOR) MISC 2 Devices by Does not apply route every 14 (fourteen) days. 04/25/20   Elayne Snare, MD  donepezil (ARICEPT) 10 MG tablet TAKE 1 TABLET BY MOUTH AT BEDTIME 09/07/20   [name], Areeg N, DO  Elastic Bandages & Supports (MEDICAL COMPRESSION STOCKINGS) MISC Wear as much as possible while awake to reduce  swelling 04/27/16   Minus Liberty, MD  gabapentin (NEURONTIN) 100 MG capsule TAKE 1 CAPSULE BY MOUTH TWICE DAILY 08/16/20   Katsadouros, Vasilios, MD  glucose blood (ACCU-CHEK GUIDE) test strip USE AS INSTRUCTED TO CHECK BLOOD SUGAR 3 TIMES DAILY. 07/13/20   [provider]  HUMALOG MIX 75/25 KWIKPEN (75-25) 100 UNIT/ML Kwikpen INJECT 10 UNITS SUBCUTANEOUSLY ONCE DAILY 04/17/20   Elayne Snare, MD  insulin lispro (HUMALOG KWIKPEN) 100 UNIT/ML KwikPen 3 units before dinner 10/03/20   Elayne Snare, MD  Insulin Pen Needle (B-D UF III MINI PEN NEEDLES) 31G X 5 MM MISC USE TO INJECT INSULIN THREE TIMES A DAY 12/23/20   Elayne Snare, MD  Lancets MISC 1 each by Other route. 02/01/20   [provider]  levETIRAcetam (KEPPRA) 500 MG tablet TAKE 1 TABLET BY MOUTH TWICE DAILY 06/07/20   [name], Areeg N, DO  olmesartan (BENICAR) 20 MG tablet Take 10 mg by mouth daily. 09/26/20   [provider]  Potassium Chloride ER 20 MEQ TBCR Take 20 mEq by mouth 2 (two) times daily. 01/02/20   Madalyn Rob, MD  torsemide (DEMADEX) 20 MG tablet Take 1 tablet (20 mg total) by mouth daily. 01/02/20   Madalyn Rob, MD  triamcinolone ointment (KENALOG) 0.5 % APPLY 1 APPLICATION TOPICALLY TO RASH TWICE DAILY FOR ITCHING 08/16/20   Riesa Pope, MD  TRULICITY 1.5 YQ/8.2NO SOPN INJECT 1.5MG  SUBCUTANEOUSLY EVERY WEEK ON THURSDAY 07/29/20   Elayne Snare, MD    Allergies    Aspirin  Review of Systems   Review of Systems  Constitutional:  Negative for chills and fever.  HENT:  Negative for ear pain and sore throat.   Eyes:  Negative for pain and visual disturbance.  Respiratory:  Negative for cough and shortness of breath.   Cardiovascular:  Negative for chest pain and palpitations.  Gastrointestinal:  Positive for vomiting (blood in vomitus). Negative for abdominal pain and diarrhea.  Genitourinary:  Negative for dysuria and hematuria.  Musculoskeletal:  Negative for arthralgias, back pain and  myalgias.  Skin:  Negative for color change and rash.  Neurological:  Negative for seizures, syncope and headaches.  All other systems reviewed and are negative.  Physical Exam Updated Vital Signs  ED Triage Vitals  Enc Vitals Group     BP 12/23/20 0724 113/84     Pulse Rate 12/23/20 0724 60     Resp 12/23/20 0724 18     Temp 12/23/20 0724 97.9 F (36.6 C)     Temp Source 12/23/20 0724 Oral     SpO2 12/23/20 0724 95 %  Weight --      Height --      Head Circumference --      Peak Flow --      Pain Score 12/23/20 0725 0     Pain Loc --      Pain Edu? --      Excl. in [place]? --      Physical Exam Vitals and nursing note reviewed.  Constitutional:      General: She is not in acute distress.    Appearance: She is well-developed. She is not ill-appearing.  HENT:     Head: Normocephalic and atraumatic.     Nose: Nose normal.     Mouth/Throat:     Mouth: Mucous membranes are moist.  Eyes:     Extraocular Movements: Extraocular movements intact.     Conjunctiva/sclera: Conjunctivae normal.     Pupils: Pupils are equal, round, and reactive to light.  Cardiovascular:     Rate and Rhythm: Normal rate and regular rhythm.     Pulses: Normal pulses.     Heart sounds: Normal heart sounds. No murmur heard. Pulmonary:     Effort: Pulmonary effort is normal. No respiratory distress.     Breath sounds: Normal breath sounds.  Abdominal:     General: Abdomen is flat.     Palpations: Abdomen is soft.     Tenderness: There is no abdominal tenderness.  Genitourinary:    Rectum: Guaiac result positive.  Musculoskeletal:     Cervical back: Normal range of motion and neck supple.  Skin:    General: Skin is warm and dry.     Capillary Refill: Capillary refill takes less than 2 seconds.  Neurological:     General: No focal deficit present.     Mental Status: She is alert.  Psychiatric:        Mood and Affect: Mood normal.    ED Results / Procedures / Treatments   Labs (all  labs ordered are listed, but only abnormal results are displayed) Labs Reviewed  CBC WITH DIFFERENTIAL/PLATELET - Abnormal; Notable for the following components:      Result Value   RBC 1.82 (*)    Hemoglobin 5.5 (*)    HCT 17.1 (*)    Platelets 96 (*)    All other components within normal limits  COMPREHENSIVE METABOLIC PANEL - Abnormal; Notable for the following components:   CO2 21 (*)    Glucose, Bld 228 (*)    BUN 37 (*)    Creatinine, Ser 2.32 (*)    Calcium 8.5 (*)    Total Protein 4.7 (*)    Albumin 2.6 (*)    GFR, Estimated 21 (*)    All other components within normal limits  POC OCCULT BLOOD, ED - Abnormal; Notable for the following components:   Fecal Occult Bld POSITIVE (*)    All other components within normal limits  RESP PANEL BY RT-PCR (FLU A&B, COVID) ARPGX2  TYPE AND SCREEN  PREPARE RBC (CROSSMATCH)  PREPARE RBC (CROSSMATCH)    EKG None  Radiology No results found.  Procedures .Critical Care Performed by: Lennice Sites, DO Authorized by: Lennice Sites, DO   Critical care provider statement:    Critical care time (minutes):  45   Critical care was necessary to treat or prevent imminent or life-threatening deterioration of the following conditions:  Circulatory failure   Critical care was time spent personally by me on the following activities:  Development of treatment plan with patient  or surrogate, blood draw for specimens, discussions with consultants, discussions with primary provider, evaluation of patient's response to treatment, examination of patient, obtaining history from patient or surrogate, ordering and performing treatments and interventions, ordering and review of laboratory studies, pulse oximetry, ordering and review of radiographic studies, re-evaluation of patient's condition and review of old charts   Care discussed with: admitting provider     Medications Ordered in ED Medications  pantoprozole (PROTONIX) 80 mg /NS 100 mL infusion  (8 mg/hr Intravenous New Bag/Given 12/23/20 0831)  pantoprazole (PROTONIX) injection 40 mg (has no administration in time range)  0.9 %  sodium chloride infusion (10 mL/hr Intravenous Not Given 12/23/20 0929)  pantoprazole (PROTONIX) 80 mg /NS 100 mL IVPB (0 mg Intravenous Stopped 12/23/20 0844)  sodium chloride 0.9 % bolus 1,000 mL (0 mLs Intravenous Stopped 12/23/20 0930)  0.9 %  sodium chloride infusion (10 mL/hr Intravenous New Bag/Given 12/23/20 9169)    ED Course  I have reviewed the triage vital signs and the nursing notes.  Pertinent labs & imaging results that were available during my care of the patient were reviewed by me and considered in my medical decision making (see chart for details).    MDM Rules/Calculators/A&P                           ANDE THERRELL is an 80 year old female here with hematemesis.  History of Osler Weber Rendu syndrome, history of gastric AVM bleeds/ulcers/reflux.  Has had about a week of hematemesis she states.  Thought that it was getting better but got worse overnight.  She appears to have dark stool on exam.  Hemoccult is positive.  She denies any pain.  She has no complaints.  She has had multiple EGDs in the past that have showed AVMs and ulcer bleeds.  We will start patient on IV Protonix bolus and infusion.  We will get basic labs including type and screen.  Follows with the [facility] GI team.  Hemoglobin is 5.5.  Platelets 96.  Creatinine mildly elevated.  Blood pressure dropped into the mid 80s.  Responded to IV fluids with patient given 1 unit of emergency release packed red blood cells.  We will type and screen her for 2 additional units.  I have talked with the [facility] GI team with Dr. Paulita Fujita who will come evaluate the patient.  We will admit the patient to ICU for further care given hypotension.  Overall suspect upper GI bleed.  This chart was dictated using voice recognition software.  Despite best efforts to proofread,  errors can occur which can  change the documentation meaning.    Final Clinical Impression(s) / ED Diagnoses Final diagnoses:  Acute upper GI bleed    Rx / DC Orders ED Discharge Orders     None        Lennice Sites, DO 12/23/20 [place], [place], DO 12/23/20 [phone]

## 2020-12-23 NOTE — Consult Note (Signed)
Referring Provider: Dr. Lennice Sites Primary Care Physician:  Mike Craze, DO Primary Gastroenterologist:  Dr. Watt Climes  Reason for Consultation:  Hematemesis  HPI: [name] is a 80 y.o. 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 States that the hematemesis began 1 week ago.  She states she has 2 episodes of vomiting per day.  States some of her emesis is nonbloody and some of that has large amounts of blood.  Admits her symptoms are similar to the last time she had bleeding.  She denies any abdominal pain, chest pain, shortness of breath, nausea, vomiting, constipation, diarrhea, reflux symptoms,hematochezia and melena.  States she uses 1 tablet of ibuprofen per week.  She denies any other NSAID use.  Denies weight loss but admits to poor appetite.  Denies any hematemesis since previous EGD in 2019.  Patient denies use of blood thinners.  Denies family history of colon cancer.  She has had 8 previous EGDs.  All were significant for multiple AVMs found. Most recent: EGD 12/04/2017 - LA Grade C reflux esophagitis. - Z-line, 35 cm from the incisors. - Oozing gastric ulcer with pigmented material. Injected. Clip was placed. - A single bleeding angiodysplastic lesion in the stomach. Treated with argon plasma coagulation (APC). - Normal examined duodenum. - Acute gastritis. - Small hiatal hernia. - No specimens collected.  EGD 09/28/2015 - Tiny hiatal hernia. - A single non-bleeding avm in the middle third of the esophagus. Treated with argon plasma coagulation (APC). 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 Treated with argon plasma coagulation (APC). - A single non-bleeding angiodysplastic lesion in the duodenum. - Normal second portion of the duodenum. - An endoclip was found in the stomach. - Enlarged gastric folds. - The examination was otherwise normal. - No specimens collected.     Past Medical  History:  Diagnosis Date  . Chronic anemia   . Chronic diastolic CHF (congestive heart failure) ([place]) 10/03/2013  . Chronic GI bleeding    [name] 11/29/2014  . Family history of anesthesia complication    "niece has a hard time coming out" (09/15/2012)  . Frequent nosebleeds    chronic  . Gastric AV malformation    [name] 11/29/2014  . GERD (gastroesophageal reflux disease)   . 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 History of blood transfusion "several"  . History of epistaxis   . HTN (hypertension), benign 03/02/2012  . Hyperlipidemia   . Iron deficiency anemia    chronic infusions"  . Lichen planus    Both lower extremities  . 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 Overgrown toenails 03/18/2017  . Pneumonia 1990's X 2  . Pulmonary HTN ([place]) 04/01/2017   PASP 62mmHg on echo 09/2016 and 29mmHg by echo 2019  . Seizures ([place]) 09/2014  . Symptomatic anemia 11/29/2014  . Telangiectasia    Gastric   . 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    Past Surgical History:  Procedure Laterality Date  . CATARACT EXTRACTION     "I think it was just one eye"  . ESOPHAGOGASTRODUODENOSCOPY  02/26/2011   Procedure: ESOPHAGOGASTRODUODENOSCOPY (EGD);  Surgeon: [name], MD;  Location: [name];  Service: Endoscopy;  Laterality: N/A;  . ESOPHAGOGASTRODUODENOSCOPY N/A 11/08/2012   Procedure: ESOPHAGOGASTRODUODENOSCOPY (EGD);  Surgeon: [name], MD;  Location: [name];  Service: Endoscopy;  Laterality: N/A;  .  ESOPHAGOGASTRODUODENOSCOPY N/A 10/04/2013   Procedure: ESOPHAGOGASTRODUODENOSCOPY (EGD);  Surgeon: Winfield Cunas., MD;  Location: [name];  Service: Endoscopy;  Laterality: N/A;  with APC on stand-by  . ESOPHAGOGASTRODUODENOSCOPY N/A 07/06/2014   Procedure: ESOPHAGOGASTRODUODENOSCOPY (EGD);  Surgeon: Clarene Essex, MD;  Location: [name];  Service: Endoscopy;  Laterality: N/A;  . ESOPHAGOGASTRODUODENOSCOPY N/A  09/05/2014   Procedure: ESOPHAGOGASTRODUODENOSCOPY (EGD);  Surgeon: Laurence Spates, MD;  Location: [name];  Service: Endoscopy;  Laterality: N/A;  APC on standby to control bleeding  . ESOPHAGOGASTRODUODENOSCOPY N/A 11/29/2014   Procedure: ESOPHAGOGASTRODUODENOSCOPY (EGD);  Surgeon: [name], MD;  Location: [facility] ENDOSCOPY;  Service: Endoscopy;  Laterality: N/A;  . ESOPHAGOGASTRODUODENOSCOPY N/A 09/28/2015   Procedure: ESOPHAGOGASTRODUODENOSCOPY (EGD);  Surgeon: Clarene Essex, MD;  Location: [facility] ENDOSCOPY;  Service: Endoscopy;  Laterality: N/A;  . ESOPHAGOGASTRODUODENOSCOPY (EGD) WITH PROPOFOL N/A 12/04/2017   Procedure: ESOPHAGOGASTRODUODENOSCOPY (EGD) WITH PROPOFOL;  Surgeon: [name], MD;  Location: [place];  Service: Endoscopy;  Laterality: N/A;  . ESOPHAGOGASTRODUODENOSCOPY ENDOSCOPY  08/19/2006   with laser treatment  . HOT HEMOSTASIS N/A 07/06/2014   Procedure: HOT HEMOSTASIS (ARGON PLASMA COAGULATION/BICAP);  Surgeon: Clarene Essex, MD;  Location: [name];  Service: Endoscopy;  Laterality: N/A;  . HOT HEMOSTASIS N/A 09/28/2015   Procedure: HOT HEMOSTASIS (ARGON PLASMA COAGULATION/BICAP);  Surgeon: Clarene Essex, MD;  Location[facility] ENDOSCOPY;  Service: Endoscopy;  Laterality: N/A;  . HOT HEMOSTASIS N/A 12/04/2017   Procedure: HOT HEMOSTASIS (ARGON PLASMA COAGULATION/BICAP);  Surgeon: [name], MD;  Location: [place];  Service: Endoscopy;  Laterality: N/A;  . NASAL HEMORRHAGE CONTROL     "for bleeding"   . SAVORY DILATION  02/26/2011   Procedure: SAVORY DILATION;  Surgeon: [name], MD;  Location: [name];  Service: Endoscopy;  Laterality: N/A;  c-arm needed  . SUBMUCOSAL INJECTION  12/04/2017   Procedure: SUBMUCOSAL INJECTION;  Surgeon: [name], MD;  Location: [place];  Service: Endoscopy;;    Prior to Admission medications   Medication Sig Start Date End Date Taking? Authorizing Provider  amLODipine (NORVASC) 10 MG tablet TAKE 1  TABLET BY MOUTH EVERY MORNING Patient taking differently: Take 10 mg by mouth daily. 10/07/20  Yes [name], Areeg N, DO  donepezil (ARICEPT) 10 MG tablet TAKE 1 TABLET BY MOUTH AT BEDTIME Patient taking differently: Take 10 mg by mouth at bedtime. 09/07/20  Yes [name], Areeg N, DO  gabapentin (NEURONTIN) 100 MG capsule TAKE 1 CAPSULE BY MOUTH TWICE DAILY Patient taking differently: Take 100 mg by mouth 2 (two) times daily. 08/16/20  Yes Katsadouros, Vasilios, MD  HUMALOG MIX 75/25 KWIKPEN (75-25) 100 UNIT/ML Kwikpen INJECT 10 UNITS SUBCUTANEOUSLY ONCE DAILY Patient taking differently: Inject 8 Units into the skin daily. 04/17/20  Yes Elayne Snare, MD  insulin lispro (HUMALOG KWIKPEN) 100 UNIT/ML KwikPen 3 units before dinner Patient taking differently: Inject 3 Units into the skin daily before supper. 3 units before dinner 10/03/20  Yes Elayne Snare, MD  levETIRAcetam (KEPPRA) 500 MG tablet TAKE 1 TABLET BY MOUTH TWICE DAILY Patient taking differently: Take 500 mg by mouth 2 (two) times daily. 06/07/20  Yes [name], Areeg N, DO  memantine (NAMENDA) 10 MG tablet TAKE 1 TABLET BY MOUTH TWICE DAILY Patient taking differently: Take 10 mg by mouth 2 (two) times daily. 11/11/20  Yes [name], Areeg N, DO  Misc Natural Products (IMMUNE FORMULA PO) Take 1 tablet by mouth daily.   Yes [provider]  Multiple Vitamins-Minerals (MULTI FOR HER 50+) TABS Take 1 tablet by  mouth daily.   Yes [provider]  olmesartan (BENICAR) 20 MG tablet Take 10 mg by mouth daily. 09/26/20  Yes [provider]  Potassium Chloride ER 20 MEQ TBCR Take 20 mEq by mouth 2 (two) times daily. 01/02/20  Yes Madalyn Rob, MD  triamcinolone ointment (KENALOG) 0.5 % APPLY 1 APPLICATION TOPICALLY TO RASH TWICE DAILY FOR ITCHING Patient taking differently: Apply 1 application topically 2 (two) times daily as needed (itching). 08/16/20  Yes Katsadouros, Vasilios, MD  TRULICITY 1.5 EV/0.3JK SOPN INJECT 1.5MG  SUBCUTANEOUSLY  EVERY WEEK ON THURSDAY Patient taking differently: Inject 1.5 mg into the skin once a week. ON THURSDAY 07/29/20  Yes Elayne Snare, MD  ACCU-CHEK GUIDE test strip USE AS INSTRUCTED TO CHECK BLOOD SUGAR 3 TIMES DAILY. 08/08/20   Elayne Snare, MD  Accu-Chek Softclix Lancets lancets 1 each by Other route 3 (three) times daily. Use as instructed to check blood sugar 3 times per day dx code E11.65 02/01/20   Elayne Snare, MD  Continuous Blood Gluc Sensor (FREESTYLE LIBRE 2 SENSOR) MISC 2 Devices by Does not apply route every 14 (fourteen) days. 04/25/20   Elayne Snare, MD  Elastic Bandages & Supports ([place]) [place] Wear as much as possible while awake to reduce swelling 04/27/16   Minus Liberty, MD  glucose blood (ACCU-CHEK GUIDE) test strip USE AS INSTRUCTED TO CHECK BLOOD SUGAR 3 TIMES DAILY. 07/13/20   [provider]  Insulin Pen Needle (B-D UF III MINI PEN NEEDLES) 31G X 5 MM MISC USE TO INJECT INSULIN THREE TIMES A DAY 12/23/20   Elayne Snare, MD  Lancets MISC 1 each by Other route. 02/01/20   [provider]  torsemide (DEMADEX) 20 MG tablet Take 1 tablet (20 mg total) by mouth daily. Patient not taking: Reported on 12/23/2020 01/02/20   Madalyn Rob, MD    Scheduled Meds: . Derrill Memo ON 12/26/2020] pantoprazole  40 mg Intravenous Q12H   Continuous Infusions: . sodium chloride    . pantoprazole 8 mg/hr (12/23/20 0831)   PRN Meds:.  Allergies as of 12/23/2020 - Review Complete 12/23/2020  Allergen Reaction Noted  . Aspirin Nausea And Vomiting 05/12/2006    Family History  Problem Relation Age of Onset  . Stroke Father   . Healthy Mother   . Breast cancer Other   . Diabetes Niece   . Malignant hyperthermia Neg Hx   . Seizures Neg Hx     Social History   Socioeconomic History  . Marital status: Married    Spouse name: [name]  . Number of children: 0  . Years of education: 9  . Highest education level: Not on file  Occupational History  .  Occupation: Retired Systems developer: RETIRED  Tobacco Use  . Smoking status: Former    Packs/day: 1.00    Years: 20.00    Pack years: 20.00    Types: Cigarettes    Quit date: 02/10/1971    Years since quitting: 49.9  . Smokeless tobacco: Never  . Tobacco comments:    09/15/2012 "smoked 50-60 yr ago"  Vaping Use  . Vaping Use: Never used  Substance and Sexual Activity  . Alcohol use: No    Alcohol/week: 0.0 standard drinks  . Drug use: No  . Sexual activity: Not on file  Other Topics Concern  . Not on file  Social History Narrative   Married, lives with husband, [name].  Ambulates without assistance.     Caffeine use: none  Social Determinants of Health   Financial Resource Strain: Not on file  Food Insecurity: Not on file  Transportation Needs: Not on file  Physical Activity: Not on file  Stress: Not on file  Social Connections: Not on file  Intimate Partner Violence: Not on file    Review of Systems:  Review of Systems  Constitutional:  Negative for chills, diaphoresis, fever, malaise/fatigue and weight loss.  HENT:  Negative for hearing loss and sore throat.   Eyes:  Negative for blurred vision and double vision.  Respiratory:  Negative for cough and hemoptysis.   Cardiovascular:  Negative for chest pain and palpitations.  Gastrointestinal:  Positive for vomiting. Negative for abdominal pain, blood in stool, constipation, diarrhea, heartburn, melena and nausea.  Genitourinary:  Negative for dysuria and urgency.  Musculoskeletal:  Negative for back pain and joint pain.  Skin:  Negative for rash.  Neurological:  Negative for dizziness and headaches.  Psychiatric/Behavioral:  Negative for depression. The patient is not nervous/anxious.    Physical Exam: Vital signs: Vitals:   12/23/20 0941 12/23/20 1007  BP: (!) 86/28 (!) 92/46  Pulse: 63 71  Resp: 16 16  Temp: 97.9 F (36.6 C)   SpO2: 96% 95%     General:   Alert, in NAD Heart:  Regular rate and  rhythm; no murmurs Pulm: Clear anteriorly; no wheezing Abdomen:  Soft, Obese AB, skin exam normal, Normal bowel sounds. mild tenderness in the epigastrium. Without guarding and Without rebound, without organomegaly. Extremities:  Without edema. Neurologic:  Alert and  oriented x4;  grossly normal neurologically. Psych:  Alert and cooperative. Normal mood and affect.  GI:  Lab Results: Recent Labs    12/23/20 0752  WBC 7.7  HGB 5.5*  HCT 17.1*  PLT 96*   BMET Recent Labs    12/23/20 0752  NA 141  K 4.1  CL 109  CO2 21*  GLUCOSE 228*  BUN 37*  CREATININE 2.32*  CALCIUM 8.5*   LFT Recent Labs    12/23/20 0752  PROT 4.7*  ALBUMIN 2.6*  AST 21  ALT 14  ALKPHOS 104  BILITOT 0.7   PT/INR No results for input(s): LABPROT, INR in the last 72 hours.  Studies/Results: No results found.  Impression Hematemesis Possibly upper GI bleed due to AVMs or bleeding ulcer with previous EGD 2019.   Elevated BUN and her previous history suggest upper GI in origin.  Anemia  WBC 7.7 HGB 5.5 (11.8 on 11/27/2020) MCV 94.0 Platelets 96 BUN 37 (25 on 11/27/2020)   CR 2.32 (2.06 on 11/27/2020)  Iron 60 Ferritin 377 B12 1,615  S/p 1 unit PBRs getting at this time.  likely upper GI bleed given elevated BUN and previous history of gastric AVMs and bleeding ulcers.  Hypotension BP 90/40 while in the room No CP or SOB Likely from volume depletion, getting blood now, will need to repeat vitals prior to EGD tomorrow    Plan Plan for EGD tomorrow. I thoroughly discussed the procedure to include nature, alternatives, benefits, and risks including but not limited to bleeding, perforation, infection, anesthesia/cardiac and pulmonary complications. Patient provides understanding and gave verbal consent to proceed.   Protonix 40 mg IV BID.   Clear liquid diet, NPO at midnight.   Continue daily CBC with transfusion as needed to maintain Hgb >7.   Counseled patient on refraining from  NSAIDs due to history of bleeding gastric ulcers.  Will repeat vitals before EGD tomorrow.  [facility] will follow.  LOS: 0 days   Vladimir Crofts  PA-C 12/23/2020, 10:20 AM  Contact #  [phone]

## 2020-12-23 NOTE — ED Triage Notes (Signed)
ES reports from home, hematemesis since this morning 0500. Pt placed on Amlodipine x 1 week. Husband states this has happened before, but no other Hx noted. Pt denies pain or nausea. Hx of diabetes.  BP 156/90 HR 60 RR 18 Sp02 99 RA CBG 258  22 L forearm

## 2020-12-24 ENCOUNTER — Inpatient Hospital Stay (HOSPITAL_COMMUNITY): Payer: Medicare Other | Admitting: Anesthesiology

## 2020-12-24 ENCOUNTER — Encounter (HOSPITAL_COMMUNITY)
Admission: EM | Disposition: A | Discharge status: Home / Self Care | Payer: Self-pay | Source: Home / Self Care | Attending: Internal Medicine

## 2020-12-24 DIAGNOSIS — N179 Acute kidney failure, unspecified: Secondary | ICD-10-CM | POA: Diagnosis not present

## 2020-12-24 DIAGNOSIS — K922 Gastrointestinal hemorrhage, unspecified: Secondary | ICD-10-CM | POA: Diagnosis not present

## 2020-12-24 HISTORY — PX: HEMOSTASIS CLIP PLACEMENT: SHX6857

## 2020-12-24 HISTORY — PX: ESOPHAGOGASTRODUODENOSCOPY (EGD) WITH PROPOFOL: SHX5813

## 2020-12-24 HISTORY — PX: SCLEROTHERAPY: SHX6841

## 2020-12-24 LAB — TYPE AND SCREEN
ABO/RH(D): A POS
Antibody Screen: NEGATIVE
Unit division: 0
Unit division: 0

## 2020-12-24 LAB — GLUCOSE, CAPILLARY
Glucose-Capillary: 115 mg/dL — ABNORMAL HIGH (ref 70–99)
Glucose-Capillary: 135 mg/dL — ABNORMAL HIGH (ref 70–99)
Glucose-Capillary: 164 mg/dL — ABNORMAL HIGH (ref 70–99)
Glucose-Capillary: 202 mg/dL — ABNORMAL HIGH (ref 70–99)
Glucose-Capillary: 210 mg/dL — ABNORMAL HIGH (ref 70–99)
Glucose-Capillary: 216 mg/dL — ABNORMAL HIGH (ref 70–99)
Glucose-Capillary: 52 mg/dL — ABNORMAL LOW (ref 70–99)
Glucose-Capillary: 65 mg/dL — ABNORMAL LOW (ref 70–99)

## 2020-12-24 LAB — BPAM RBC
Blood Product Expiration Date: 202211282359
Blood Product Expiration Date: 202211292359
ISSUE DATE / TIME: 202211141748
ISSUE DATE / TIME: 202211141959
Unit Type and Rh: 6200
Unit Type and Rh: 6200

## 2020-12-24 LAB — CBC
HCT: 32.3 % — ABNORMAL LOW (ref 36.0–46.0)
Hemoglobin: 11.4 g/dL — ABNORMAL LOW (ref 12.0–15.0)
MCH: 30.6 pg (ref 26.0–34.0)
MCHC: 35.3 g/dL (ref 30.0–36.0)
MCV: 86.8 fL (ref 80.0–100.0)
Platelets: 84 10*3/uL — ABNORMAL LOW (ref 150–400)
RBC: 3.72 MIL/uL — ABNORMAL LOW (ref 3.87–5.11)
RDW: 15.5 % (ref 11.5–15.5)
WBC: 8.4 10*3/uL (ref 4.0–10.5)
nRBC: 0 % (ref 0.0–0.2)

## 2020-12-24 LAB — BASIC METABOLIC PANEL
Anion gap: 7 (ref 5–15)
BUN: 35 mg/dL — ABNORMAL HIGH (ref 8–23)
CO2: 22 mmol/L (ref 22–32)
Calcium: 8 mg/dL — ABNORMAL LOW (ref 8.9–10.3)
Chloride: 114 mmol/L — ABNORMAL HIGH (ref 98–111)
Creatinine, Ser: 1.65 mg/dL — ABNORMAL HIGH (ref 0.44–1.00)
GFR, Estimated: 31 mL/min — ABNORMAL LOW (ref >60)
Glucose, Bld: 75 mg/dL (ref 70–99)
Potassium: 4.1 mmol/L (ref 3.5–5.1)
Sodium: 143 mmol/L (ref 135–145)

## 2020-12-24 LAB — HEMOGLOBIN AND HEMATOCRIT, BLOOD
HCT: 34.7 % — ABNORMAL LOW (ref 36.0–46.0)
Hemoglobin: 12.4 g/dL (ref 12.0–15.0)

## 2020-12-24 SURGERY — ESOPHAGOGASTRODUODENOSCOPY (EGD) WITH PROPOFOL
Anesthesia: Monitor Anesthesia Care

## 2020-12-24 MED ORDER — EPINEPHRINE 1 MG/10ML IJ SOSY
PREFILLED_SYRINGE | INTRAMUSCULAR | Status: AC
  Filled 2020-12-24: qty 10

## 2020-12-24 MED ORDER — PROPOFOL 500 MG/50ML IV EMUL
INTRAVENOUS | Status: DC | PRN
Start: 2020-12-24 — End: 2020-12-24
  Administered 2020-12-24: 125 ug/kg/min via INTRAVENOUS

## 2020-12-24 MED ORDER — WHITE PETROLATUM EX OINT
TOPICAL_OINTMENT | CUTANEOUS | Status: AC
  Filled 2020-12-24: qty 28.35

## 2020-12-24 MED ORDER — DEXTROSE 50 % IV SOLN
INTRAVENOUS | Status: AC
  Filled 2020-12-24: qty 50

## 2020-12-24 MED ORDER — SODIUM CHLORIDE (PF) 0.9 % IJ SOLN
PREFILLED_SYRINGE | INTRAMUSCULAR | Status: DC | PRN
  Administered 2020-12-24: 3.5 mL

## 2020-12-24 MED ORDER — PROPOFOL 10 MG/ML IV BOLUS
INTRAVENOUS | Status: DC | PRN
  Administered 2020-12-24: 50 mg via INTRAVENOUS

## 2020-12-24 MED ORDER — FENTANYL CITRATE (PF) 100 MCG/2ML IJ SOLN
INTRAMUSCULAR | Status: AC
  Filled 2020-12-24: qty 2

## 2020-12-24 MED ORDER — FENTANYL CITRATE (PF) 100 MCG/2ML IJ SOLN
25.0000 ug | INTRAMUSCULAR | Status: DC | PRN
  Administered 2020-12-24: 25 ug via INTRAVENOUS

## 2020-12-24 MED ORDER — SODIUM CHLORIDE 0.9 % IV SOLN
INTRAVENOUS | Status: AC | PRN
  Administered 2020-12-24: 500 mL via INTRAVENOUS

## 2020-12-24 MED ORDER — ONDANSETRON HCL 4 MG/2ML IJ SOLN: 4.0000 mg | Freq: Four times a day (QID) | INTRAMUSCULAR | Status: DC | PRN

## 2020-12-24 MED ORDER — OXYCODONE HCL 5 MG PO TABS: 5.0000 mg | ORAL_TABLET | Freq: Once | ORAL | Status: DC | PRN

## 2020-12-24 MED ORDER — SODIUM CHLORIDE 0.9 % IV SOLN: INTRAVENOUS | Status: DC

## 2020-12-24 MED ORDER — DEXTROSE 50 % IV SOLN
12.5000 g | INTRAVENOUS | Status: AC
  Administered 2020-12-24: 12.5 g via INTRAVENOUS

## 2020-12-24 MED ORDER — LIDOCAINE 2% (20 MG/ML) 5 ML SYRINGE
INTRAMUSCULAR | Status: DC | PRN
  Administered 2020-12-24 (×2): 50 mg via INTRAVENOUS

## 2020-12-24 MED ORDER — OXYCODONE HCL 5 MG/5ML PO SOLN: 5.0000 mg | Freq: Once | ORAL | Status: DC | PRN

## 2020-12-24 SURGICAL SUPPLY — 15 items

## 2020-12-24 NOTE — Progress Notes (Signed)
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 Other history includes diastolic CHF, pulmonary HTN, and DM. She presented to [facility] ED on 11/14 with complaints of hematemesis x 1 week, which was worse overnight 11/13 prompting presentation. Patient reports using ibuprofen nightly. No alcohol use. Upon arrival to ED she was started on protonix infusion and labs were sent. Hemoglobin with critical at 5.5. She then developed hypotension with SBPs dropping into the 80s. Blood transfusion was ordered and gastroenterology was consulted. In the setting of hemorrhagic shock, PCCM was asked to admit.   Pertinent  Medical History   Past Medical History:  Diagnosis Date   Chronic anemia    Chronic diastolic CHF (congestive heart failure) ([place]) 10/03/2013   Chronic GI bleeding    /notes 11/29/2014   Family history of anesthesia complication    "niece has a hard time coming out" (09/15/2012)   Frequent nosebleeds    chronic   Gastric AV malformation    /notes 11/29/2014   GERD (gastroesophageal reflux disease)    Heart murmur 04/01/2017   Moderate AVSC on echo 09/2016   History of blood transfusion "several"   History of epistaxis    HTN (hypertension), benign 03/02/2012   Hyperlipidemia    Iron deficiency anemia    chronic infusions"   Lichen planus    Both lower extremities   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   Significant Hospital Events: Including procedures, antibiotic start and stop dates in addition to other pertinent events   11/14 > admitted to [facility] for hypovolemic shock   Interim History / Subjective:  Overnight, no events This morning, patient is resting comfortably in bed, no acute concerns at this time.  EGD scheduled for this AM.   Objective   Blood pressure (!) 129/95, pulse 72, temperature 98.3 F (36.8 C), temperature source Oral, resp. rate (!) 25, weight 63.6 kg, SpO2 93 %.        Intake/Output Summary (Last 24 hours) at 12/24/2020 0730 Last data filed at 12/24/2020 0000 Gross per 24 hour  Intake 2153.28 ml  Output 425 ml  Net 1728.28 ml   Filed Weights   12/23/20 2200 12/24/20 0435  Weight: 63.6 kg 63.6 kg    Examination: General: elderly female, no acute distress HENT: [place]/AT, anicteric sclerae, PERRL, EOMI Lungs: CTAB on room air  Cardiovascular: irregular rhythm, normal rate, S1 and S2 present, no m/r/g Abdomen: soft, nondistended, nontender, +BS Extremities: warm and dry, no peripheral edema  Neuro: awake and alert x4, no apparent focal deficits noted Skin: no rash   Resolved Hospital Problem list   Hemorrhagic shock  Assessment & Plan:  Acute blood loss anemia in setting of upper GI bleed:  Patient presented with Hb 5.5 in setting of hematemesis. Has a history  of HHT with prior AVMs and gastric ulcers in the past. S/p 3u pRBC. Hb 11.4 this AM. No signs of active bleeding at this time.  - IV protonix 40mg  bid  - EGD this AM - Trend CBC, transfuse for goal Hb>7  AKI on CKDII: Baseline sCr ~1.0-1.1. Pre-renal in setting of GI losses as above. Improved to 1.65 this AM.  - Continue to trend renal function - Avoid nephrotoxic agents   Diabetes mellitus:  Home regimen: Trulicity 1.5mg  weekly; Humalog mix 8U in AM, Humalog 5u in PM  Currently NPO for EGD.  - SSI sensitive with meals  once able to tolerate PO  - CBG monitoring   Seizure disorder - Continue home keppra 500mg  bid   HFpEF HTN Home antihypertensives initially held in setting of shock Currently normotensive - Resume home meds following EGD   Best Practice (right click and "Reselect all SmartList Selections" daily)   Diet/type: NPO w/ oral meds DVT prophylaxis: SCD GI prophylaxis: PPI Lines: N/A Foley:  N/A Code Status:  full code Last date of multidisciplinary goals of care discussion [11/14]  Labs   CBC: Recent Labs  Lab 12/23/20 0120 12/23/20 0752 12/24/20 0246  WBC  --  7.7 8.4  NEUTROABS  --  4.6  --   HGB 12.4 5.5* 11.4*  HCT 34.7* 17.1* 32.3*  MCV  --  94.0 86.8  PLT  --  96* 84*    Basic Metabolic Panel: Recent Labs  Lab 12/23/20 0752 12/24/20 0246  NA 141 143  K 4.1 4.1  CL 109 114*  CO2 21* 22  GLUCOSE 228* 75  BUN 37* 35*  CREATININE 2.32* 1.65*  CALCIUM 8.5* 8.0*   GFR: Estimated Creatinine Clearance: 24.4 mL/min (A) (by C-G formula based on SCr of 1.65 mg/dL (H)). Recent Labs  Lab 12/23/20 0752 12/24/20 0246  WBC 7.7 8.4    Liver Function Tests: Recent Labs  Lab 12/23/20 0752  AST 21  ALT 14  ALKPHOS 104  BILITOT 0.7  PROT 4.7*  ALBUMIN 2.6*   No results for input(s): LIPASE, AMYLASE in the last 168 hours. No results for input(s): AMMONIA in the last 168 hours.  ABG    Component Value Date/Time   PHART 7.499 (H) 10/02/2014 2340   PCO2ART 35.0 10/02/2014 2340   PO2ART 82.0 10/02/2014 2340   HCO3 27.4 (H) 10/02/2014 2340   TCO2 25 10/02/2014 2358   ACIDBASEDEF 1.0 01/05/2007 2057   O2SAT 97.0 10/02/2014 2340     Coagulation Profile: No results for input(s): INR, PROTIME in the last 168 hours.  Cardiac Enzymes: No results for input(s): CKTOTAL, CKMB, CKMBINDEX, TROPONINI in the last 168 hours.  HbA1C: Hemoglobin A1C  Date/Time Value Ref Range Status  11/28/2020 01:39 PM 7.8 (A) 4.0 - 5.6 % Final  08/28/2020 11:39 AM 6.7 (A) 4.0 -  5.6 % Final   Hgb A1c MFr Bld  Date/Time Value Ref Range Status  10/31/2019 11:44 AM 8.4 (H) 4.6 - 6.5 % Final    Comment:    Glycemic Control Guidelines for People with Diabetes:Non Diabetic:  <6%Goal of Therapy: <7%Additional Action Suggested:  >8%   06/26/2019 02:07 PM 5.6 4.6 - 6.5 % Final    Comment:    Glycemic Control Guidelines for People with Diabetes:Non Diabetic:  <6%Goal of Therapy: <7%Additional Action Suggested:  >8%     CBG: Recent Labs  Lab 12/23/20 1606 12/23/20 1940 12/23/20 2333 12/24/20 0332 12/24/20 0357  GLUCAP 287* 222* 118* 65* 135*  Critical care time: 31 minutes     Harvie Heck, MD Internal Medicine, PGY-3 12/24/20 7:31 AM Pager # [phone]

## 2020-12-24 NOTE — Op Note (Signed)
[facility] Patient Name: [name] Procedure Date : 12/24/2020 MRN: [id number] Attending MD: Arta Silence , MD Date of Birth: [birth year] CSN: [id number] Age: 80 Admit Type: Inpatient Procedure:                Upper GI endoscopy Indications:              Acute post hemorrhagic anemia, Hematemesis, Melena Providers:                Arta Silence, MD, Doristine Johns, RN, Lodema Hong Technician, Technician, [facility] Technician,                            Technician Referring MD:              Medicines:                Monitored Anesthesia Care Complications:            No immediate complications. Estimated Blood Loss:     Estimated blood loss: none. Procedure:                Pre-Anesthesia Assessment:                           - Prior to the procedure, a History and Physical                            was performed, and patient medications and                            allergies were reviewed. The patient's tolerance of                            previous anesthesia was also reviewed. The risks                            and benefits of the procedure and the sedation                            options and risks were discussed with the patient.                            All questions were answered, and informed consent                            was obtained. Prior Anticoagulants: The patient has                            taken no previous anticoagulant or antiplatelet                            agents except for NSAID medication. ASA Grade  Assessment: III - A patient with severe systemic                            disease. After reviewing the risks and benefits,                            the patient was deemed in satisfactory condition to                            undergo the procedure.                           After obtaining informed consent, the endoscope was                            passed under  direct vision. Throughout the                            procedure, the patient's blood pressure, pulse, and                            oxygen saturations were monitored continuously. The                            [id number]-H190 ([id number]) Olympus endoscope was introduced                            through the mouth, and advanced to the second part                            of duodenum. The upper GI endoscopy was                            accomplished without difficulty. The patient                            tolerated the procedure well. Scope In: Scope Out: Findings:      LA Grade B (one or more mucosal breaks greater than 5 mm, not extending       between the tops of two mucosal folds) esophagitis was found.      A small hiatal hernia was present.      The exam of the esophagus was otherwise normal.      One oozing cratered gastric ulcer with a visible vessel was found on the       lesser curvature of the gastric body. The lesion was 5 mm in largest       dimension. Area was successfully injected with 3 mL of a 1:10,000       solution of epinephrine for hemostasis. For hemostasis, two hemostatic       clips were successfully placed. There was no bleeding at the end of the       procedure.      The exam of the stomach was otherwise normal.      The duodenal bulb, first portion of the duodenum and second portion of  the duodenum were normal. Impression:               - LA Grade B reflux esophagitis.                           - Small hiatal hernia.                           - Oozing gastric ulcer with a visible vessel.                            Injected. Clips were placed.                           - Normal duodenal bulb, first portion of the                            duodenum and second portion of the duodenum.                           - Hemostasis achieved at end of procedure. Recommendation:           - Return patient to hospital ward for ongoing care.                            - Clear liquid diet today.                           - Continue present medications.                           - Perform an H. pylori serology tomorrow.                           [facility] will follow. Procedure Code(s):        --- Professional ---                           [phone], Esophagogastroduodenoscopy, flexible,                            transoral; with control of bleeding, any method Diagnosis Code(s):        --- Professional ---                           K21.00, Gastro-esophageal reflux disease with                            esophagitis, without bleeding                           K44.9, Diaphragmatic hernia without obstruction or                            gangrene  K25.4, Chronic or unspecified gastric ulcer with                            hemorrhage                           D62, Acute posthemorrhagic anemia                           K92.0, Hematemesis                           K92.1, Melena (includes Hematochezia) CPT copyright 2019 American Medical Association. All rights reserved. The codes documented in this report are preliminary and upon coder review may  be revised to meet current compliance requirements. Arta Silence, MD 12/24/2020 11:06:56 AM This report has been signed electronically. Number of Addenda: 0

## 2020-12-24 NOTE — Progress Notes (Signed)
Inpatient Diabetes Program Recommendations  AACE/ADA: New Consensus Statement on Inpatient Glycemic Control (2015)  Target Ranges:  Prepandial:   less than 140 mg/dL      Peak postprandial:   less than 180 mg/dL (1-2 hours)      Critically ill patients:  140 - 180 mg/dL   Lab Results  Component Value Date   GLUCAP 115 (H) 12/24/2020   HGBA1C 7.8 (A) 11/28/2020    Review of Glycemic Control Results for [name], [name] (MRN [id number]) as of 12/24/2020 09:32  Ref. Range 12/23/2020 16:06 12/23/2020 19:40 12/23/2020 23:33 12/24/2020 03:32 12/24/2020 03:57 12/24/2020 07:40  Glucose-Capillary Latest Ref Range: 70 - 99 mg/dL 287 (H) 222 (H) 118 (H) 65 (L) 135 (H) 115 (H)   Diabetes history: DM 2 Outpatient Diabetes medications:  Humalog 75/25 8 units with breakfast/Humalog 75/25 3 units with supper Trulicity 1.5 mg weekly Current orders for Inpatient glycemic control:  Novolog sensitive q 4 hours  Inpatient Diabetes Program Recommendations:    Please consider reducing Novolog correction to very sensitive (0-6 units) q 4 hours.   Thanks,  Adah Perl, RN, BC-ADM Inpatient Diabetes Coordinator Pager [phone]  (8a-5p)

## 2020-12-24 NOTE — Transfer of Care (Signed)
Immediate Anesthesia Transfer of Care Note  Patient: [name]  Procedure(s) Performed: ESOPHAGOGASTRODUODENOSCOPY (EGD) WITH PROPOFOL SCLEROTHERAPY HEMOSTASIS CLIP PLACEMENT  Patient Location: PACU  Anesthesia Type:MAC  Level of Consciousness: awake, alert , oriented and patient cooperative  Airway & Oxygen Therapy: Patient Spontanous Breathing and Patient connected to nasal cannula oxygen  Post-op Assessment: Report given to RN and Post -op Vital signs reviewed and stable  Post vital signs: Reviewed and stable  Last Vitals:  Vitals Value Taken Time  BP 102/77 12/24/20 1106  Temp    Pulse 88 12/24/20 1106  Resp 20 12/24/20 1106  SpO2 100 % 12/24/20 1106  Vitals shown include unvalidated device data.  Last Pain:  Vitals:   12/24/20 1000  TempSrc: Oral  PainSc:       Patients Stated Pain Goal: 3 (16/10/96 0454)  Complications: No notable events documented.

## 2020-12-24 NOTE — Interval H&P Note (Signed)
History and Physical Interval Note:  12/24/2020 10:31 AM  [name]  has presented today for surgery, with the diagnosis of Hematemesis and anemia.  The various methods of treatment have been discussed with the patient and family. After consideration of risks, benefits and other options for treatment, the patient has consented to  Procedure(s): ESOPHAGOGASTRODUODENOSCOPY (EGD) WITH PROPOFOL (N/A) as a surgical intervention.  The patient's history has been reviewed, patient examined, no change in status, stable for surgery.  I have reviewed the patient's chart and labs.  Questions were answered to the patient's satisfaction.     Landry Dyke

## 2020-12-24 NOTE — Progress Notes (Signed)
EGD noted, if H/H stable in AM will transfer to floor vs. Discharge.

## 2020-12-24 NOTE — Anesthesia Preprocedure Evaluation (Signed)
Anesthesia Evaluation  Patient identified by MRN, date of birth, ID band Patient awake    Reviewed: Allergy & Precautions, H&P , NPO status , Patient's Chart, lab work & pertinent test results  Airway Mallampati: II   Neck ROM: full    Dental   Pulmonary former smoker,    breath sounds clear to auscultation       Cardiovascular hypertension, + Peripheral Vascular Disease and +CHF   Rhythm:regular Rate:Normal     Neuro/Psych Seizures -,     GI/Hepatic GERD  ,GI bleeding   Endo/Other  diabetes, Type 2  Renal/GU Renal InsufficiencyRenal disease     Musculoskeletal   Abdominal   Peds  Hematology  (+) Blood dyscrasia, anemia ,   Anesthesia Other Findings   Reproductive/Obstetrics                             Anesthesia Physical Anesthesia Plan  ASA: 3  Anesthesia Plan: MAC   Post-op Pain Management:    Induction: Intravenous  PONV Risk Score and Plan: 2 and Propofol infusion and Treatment may vary due to age or medical condition  Airway Management Planned: Nasal Cannula  Additional Equipment:   Intra-op Plan:   Post-operative Plan:   Informed Consent: I have reviewed the patients History and Physical, chart, labs and discussed the procedure including the risks, benefits and alternatives for the proposed anesthesia with the patient or authorized representative who has indicated his/her understanding and acceptance.     Dental advisory given  Plan Discussed with: CRNA, Anesthesiologist and Surgeon  Anesthesia Plan Comments:         Anesthesia Quick Evaluation

## 2020-12-25 LAB — GLUCOSE, CAPILLARY
Glucose-Capillary: 114 mg/dL — ABNORMAL HIGH (ref 70–99)
Glucose-Capillary: 142 mg/dL — ABNORMAL HIGH (ref 70–99)
Glucose-Capillary: 189 mg/dL — ABNORMAL HIGH (ref 70–99)
Glucose-Capillary: 236 mg/dL — ABNORMAL HIGH (ref 70–99)
Glucose-Capillary: 244 mg/dL — ABNORMAL HIGH (ref 70–99)
Glucose-Capillary: 270 mg/dL — ABNORMAL HIGH (ref 70–99)
Glucose-Capillary: 63 mg/dL — ABNORMAL LOW (ref 70–99)
Glucose-Capillary: 88 mg/dL (ref 70–99)

## 2020-12-25 LAB — CBC
HCT: 35 % — ABNORMAL LOW (ref 36.0–46.0)
Hemoglobin: 12.2 g/dL (ref 12.0–15.0)
MCH: 30.7 pg (ref 26.0–34.0)
MCHC: 34.9 g/dL (ref 30.0–36.0)
MCV: 87.9 fL (ref 80.0–100.0)
Platelets: 93 10*3/uL — ABNORMAL LOW (ref 150–400)
RBC: 3.98 MIL/uL (ref 3.87–5.11)
RDW: 15.7 % — ABNORMAL HIGH (ref 11.5–15.5)
WBC: 6 10*3/uL (ref 4.0–10.5)
nRBC: 0 % (ref 0.0–0.2)

## 2020-12-25 LAB — BASIC METABOLIC PANEL
Anion gap: 5 (ref 5–15)
BUN: 24 mg/dL — ABNORMAL HIGH (ref 8–23)
CO2: 26 mmol/L (ref 22–32)
Calcium: 8.9 mg/dL (ref 8.9–10.3)
Chloride: 109 mmol/L (ref 98–111)
Creatinine, Ser: 1.49 mg/dL — ABNORMAL HIGH (ref 0.44–1.00)
GFR, Estimated: 35 mL/min — ABNORMAL LOW (ref >60)
Glucose, Bld: 135 mg/dL — ABNORMAL HIGH (ref 70–99)
Potassium: 4.5 mmol/L (ref 3.5–5.1)
Sodium: 140 mmol/L (ref 135–145)

## 2020-12-25 MED ORDER — DICLOFENAC SODIUM 1 % EX GEL
2.0000 g | Freq: Four times a day (QID) | CUTANEOUS | Status: DC
  Administered 2020-12-25 (×2): 2 g via TOPICAL
  Filled 2020-12-25: qty 100

## 2020-12-25 MED ORDER — INSULIN ASPART 100 UNIT/ML IJ SOLN
0.0000 [IU] | Freq: Every day | INTRAMUSCULAR | Status: DC
  Administered 2020-12-25: 2 [IU] via SUBCUTANEOUS

## 2020-12-25 MED ORDER — PANTOPRAZOLE SODIUM 40 MG PO TBEC
40.0000 mg | DELAYED_RELEASE_TABLET | Freq: Two times a day (BID) | ORAL | Status: DC
  Administered 2020-12-25 – 2020-12-26 (×3): 40 mg via ORAL
  Filled 2020-12-25 (×3): qty 1

## 2020-12-25 MED ORDER — MEMANTINE HCL 5 MG PO TABS
5.0000 mg | ORAL_TABLET | Freq: Two times a day (BID) | ORAL | Status: DC
  Administered 2020-12-25 – 2020-12-26 (×2): 5 mg via ORAL
  Filled 2020-12-25 (×3): qty 1

## 2020-12-25 MED ORDER — INSULIN ASPART 100 UNIT/ML IJ SOLN
0.0000 [IU] | Freq: Three times a day (TID) | INTRAMUSCULAR | Status: DC
  Administered 2020-12-26 (×2): 2 [IU] via SUBCUTANEOUS

## 2020-12-25 MED ORDER — INSULIN ASPART 100 UNIT/ML IJ SOLN: 0.0000 [IU] | Freq: Three times a day (TID) | INTRAMUSCULAR | Status: DC

## 2020-12-25 NOTE — Progress Notes (Addendum)
Subjective: No overnight events.  Patient reports feeling well this morning.  States that she does feel sleepy but otherwise well.  Denies any more episodes of bleeding.  States that her procedure went well yesterday.  She feels ready to go home.  Asking if she can get a cortisone injection in her shoulder while she is here.  Objective:  Vital signs in last 24 hours: Vitals:   12/24/20 2200 12/24/20 2341 12/25/20 0325 12/25/20 0605  BP: (!) 130/51     Pulse: (!) 55     Resp: 13     Temp:  97.9 F (36.6 C) 98.1 F (36.7 C)   TempSrc:  Oral Oral   SpO2: 100%     Weight:    63.6 kg  Height:       Physical Exam General: alert, appears stated age, in no acute distress resting comfortably HEENT: Normocephalic, atraumatic, EOM intact, conjunctiva normal CV: Regular rate, irregular rhythm, no murmurs rubs or gallops Pulm: Clear to auscultation bilaterally, normal work of breathing Abdomen: Soft, nondistended, bowel sounds present, no tenderness to palpation MSK: No lower extremity edema Skin: Warm and dry Neuro: Alert and oriented x3   Assessment/Plan:  Active Problems:   Type 2 diabetes mellitus without complication ([name]   Acute kidney injury ([place])   Acute upper GI bleed  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 Initially presented with a hemoglobin of 5.5 and hypotension requiring ICU admission for hemorrhagic shock.   Acute blood loss anemia in setting of upper GI bleed Initially presented in hemorrhagic shock status post 3 units PRBCs.  She had an EGD yesterday which showed reflux esophagitis, small hiatal hernia and an oozing gastric ulcer which was injected with epinephrine and clipped.  Normal duodenum.  Hemostasis was achieved.  She has a history of HHT with prior AVMs and gastric  ulcers with multiple EGDs in the past.  Hemoglobin stable at 12.2. -Trend CBC, transfuse for Hb>7 -Advance diet  -Protonix 40 mg IV twice daily, likely transition to orals today -Avoid NSAIDs  AKI on CKD III Cr 1.49. Baseline creatinine around 1.  Recently spoke with patient's nephrologist who stated her creatinine continues to fluctuate which may be in the setting of low blood pressures. -Avoid nephrotoxic agents  Type 2 DM Home medications Trulicity 1.5 mg weekly, Humalog mix 8 units in the a.m. and 5 units in the p.m. -SSI -CBG monitoring  Seizure disorder -Continue Keppra 500 mg twice daily  HFpEF Hypertension Has remained normotensive.  Home medications include amlodipine 10 mg, olmesartan 10 mg, and torsemide 20 mg.  Amlodipine dose was recently decreased by nephrology, unable to see dose adjustment but likely from 10 mg to 5 mg. -Continue to hold home blood pressure medications  Bradycardia Patient denies any symptoms of lightheadedness or dizziness.  Noted to be bradycardic at a recent nephrology office visit.  Not on any rate controlling medications.  Of note, donepezil can cause bradycardia and sometimes heart block and this was recently stopped by her nephrologist.  Rt Shoulder pain Patient endorses right shoulder pain which is chronic in nature.  States that she has had cortisone shots in the past which have helped alleviate her pain.   -Voltaren gel as needed  Prior to Admission Living Arrangement:Living at home with husband Anticipated Discharge Location: Home Barriers to Discharge:Clinical improvement  Diet: Carb modified VTE prophylaxis: SCDs Full code  Dispo: Anticipated discharge in approximately 0-1 days.   Mike Craze, DO 12/25/2020, 7:24 AM Pager: [phone] After 5pm on weekdays and 1pm on weekends: On Call pager [phone]

## 2020-12-25 NOTE — Anesthesia Postprocedure Evaluation (Signed)
Anesthesia Post Note  Patient: [place]  Procedure(s) Performed: ESOPHAGOGASTRODUODENOSCOPY (EGD) WITH PROPOFOL SCLEROTHERAPY HEMOSTASIS CLIP PLACEMENT     Patient location during evaluation: PACU Anesthesia Type: MAC Level of consciousness: awake and alert Pain management: pain level controlled Vital Signs Assessment: post-procedure vital signs reviewed and stable Respiratory status: spontaneous breathing, nonlabored ventilation, respiratory function stable and patient connected to nasal cannula oxygen Cardiovascular status: stable and blood pressure returned to baseline Postop Assessment: no apparent nausea or vomiting Anesthetic complications: no   No notable events documented.  Last Vitals:  Vitals:   12/25/20 1400 12/25/20 1500  BP: (!) 122/45 (!) 132/54  Pulse: (!) 50 (!) 52  Resp: 15 12  Temp:  37.1 C  SpO2: 100% 97%    Last Pain:  Vitals:   12/25/20 1500  TempSrc: Oral  PainSc:                  [place]

## 2020-12-25 NOTE — Progress Notes (Signed)
Subjective: Tolerating diet. No further bleeding.  Objective: Vital signs in last 24 hours: Temp:  [97.8 F (36.6 C)-98.3 F (36.8 C)] 98.3 F (36.8 C) (11/16 0700) Pulse Rate:  [54-62] 56 (11/16 1100) Resp:  [11-18] 12 (11/16 1100) BP: (76-161)/(42-113) 123/55 (11/16 1100) SpO2:  [99 %-100 %] 100 % (11/16 1100) Weight:  [63.6 kg] 63.6 kg (11/16 0605) Weight change: 0 kg Last BM Date: 12/25/20  PE: GEN:  NAD ABD:  Soft, non-tender  Lab Results: CBC    Component Value Date/Time   WBC 6.0 12/25/2020 0335   RBC 3.98 12/25/2020 0335   HGB 12.2 12/25/2020 0335   HGB 11.8 (L) 11/27/2020 1128   HGB 9.7 (L) 03/13/2015 1059   HCT 35.0 (L) 12/25/2020 0335   HCT 29.5 (L) 03/13/2015 1059   PLT 93 (L) 12/25/2020 0335   PLT 127 (L) 11/27/2020 1128   PLT 140 (L) 03/13/2015 1059   PLT 237 11/09/2014 1547   MCV 87.9 12/25/2020 0335   MCV 85.5 03/13/2015 1059   MCH 30.7 12/25/2020 0335   MCHC 34.9 12/25/2020 0335   RDW 15.7 (H) 12/25/2020 0335   RDW 21.7 (H) 03/13/2015 1059   LYMPHSABS 2.1 12/23/2020 0752   LYMPHSABS 0.4 (L) 03/13/2015 1059   MONOABS 0.7 12/23/2020 0752   MONOABS 0.3 03/13/2015 1059   EOSABS 0.3 12/23/2020 0752   EOSABS 0.3 03/13/2015 1059   BASOSABS 0.0 12/23/2020 0752   BASOSABS 0.0 03/13/2015 1059   Assessment:   Recurrent Gi bleeding, s/p EGD with epi + clip, no further bleeding.  Anemia, blood loss, stable/improving.  Plan:   NO NSAIDs (these are causing recurrent bleeds).  Pantoprazole 40 mg po bid x 6 weeks, then 40 mg po qd thereafter indefinitely.  Follow-up [facility] in 6-8 weeks.  OK for patient to discharge home today from GI perspective.  [facility] will sign-off; we'll arrange outpatient follow-up; thank you for the consultation.   Landry Dyke 12/25/2020, 11:45 AM   Cell [phone] If no answer or after 5 PM call [phone]

## 2020-12-25 NOTE — Progress Notes (Signed)
   12/25/20 0750  Clinical Encounter Type  Visited With Patient  Visit Type Spiritual support  Referral From Chaplain  Consult/Referral To [place] visited the patient for morning devotion and prayer. The attending nurse, Crystal, greeted [name] and gave pause in the morning routine for the prayer. Chaplain Baker Janus was also present when MD consulted with the patient about her discharge. The patient expressed excitement about going home, and her husband will pick her up. The patient said she has in-home health aids and her goddaughter as her support system. She said her husband has major health issues. Chaplain Baker Janus will follow up to read sacred text as requested by the patient. This note was prepared by Jeanine Luz, M.Div..  For questions please contact by phone [phone].

## 2020-12-25 NOTE — Progress Notes (Signed)
   12/24/20 1615  Clinical Encounter Type  Visited With Patient  Visit Type Spiritual support  Referral From Chaplain  Consult/Referral To Chaplain   Chaplain Jorene Guest responded to request for follow up visit. [name] actively listened as the patient spoke of her strong faith in God and she misses her church. Patient believes in morning prayer. Chaplain Chany Woolworth advised I will follow up in AM. This note was prepared by Jeanine Luz, M.Div..  For questions please contact by phone [phone].

## 2020-12-26 ENCOUNTER — Other Ambulatory Visit (HOSPITAL_COMMUNITY): Payer: Self-pay

## 2020-12-26 ENCOUNTER — Encounter (HOSPITAL_COMMUNITY): Payer: Self-pay | Admitting: Gastroenterology

## 2020-12-26 ENCOUNTER — Encounter: Payer: Self-pay | Admitting: Hematology

## 2020-12-26 ENCOUNTER — Telehealth: Payer: Self-pay | Admitting: Internal Medicine

## 2020-12-26 LAB — CBC
HCT: 37.6 % (ref 36.0–46.0)
Hemoglobin: 12.8 g/dL (ref 12.0–15.0)
MCH: 30.5 pg (ref 26.0–34.0)
MCHC: 34 g/dL (ref 30.0–36.0)
MCV: 89.5 fL (ref 80.0–100.0)
Platelets: 96 10*3/uL — ABNORMAL LOW (ref 150–400)
RBC: 4.2 MIL/uL (ref 3.87–5.11)
RDW: 14.9 % (ref 11.5–15.5)
WBC: 6.1 10*3/uL (ref 4.0–10.5)
nRBC: 0 % (ref 0.0–0.2)

## 2020-12-26 LAB — BASIC METABOLIC PANEL
Anion gap: 6 (ref 5–15)
BUN: 19 mg/dL (ref 8–23)
CO2: 25 mmol/L (ref 22–32)
Calcium: 8.5 mg/dL — ABNORMAL LOW (ref 8.9–10.3)
Chloride: 106 mmol/L (ref 98–111)
Creatinine, Ser: 1.63 mg/dL — ABNORMAL HIGH (ref 0.44–1.00)
GFR, Estimated: 32 mL/min — ABNORMAL LOW (ref >60)
Glucose, Bld: 196 mg/dL — ABNORMAL HIGH (ref 70–99)
Potassium: 4.4 mmol/L (ref 3.5–5.1)
Sodium: 137 mmol/L (ref 135–145)

## 2020-12-26 LAB — GLUCOSE, CAPILLARY
Glucose-Capillary: 200 mg/dL — ABNORMAL HIGH (ref 70–99)
Glucose-Capillary: 191 mg/dL — ABNORMAL HIGH (ref 70–99)
Glucose-Capillary: 165 mg/dL — ABNORMAL HIGH (ref 70–99)

## 2020-12-26 MED ORDER — HEPARIN SOD (PORK) LOCK FLUSH 100 UNIT/ML IV SOLN
500.0000 [IU] | INTRAVENOUS | Status: AC | PRN
  Administered 2020-12-26: 500 [IU]
  Filled 2020-12-26: qty 5

## 2020-12-26 MED ORDER — PANTOPRAZOLE SODIUM 40 MG PO TBEC
40.0000 mg | DELAYED_RELEASE_TABLET | Freq: Two times a day (BID) | ORAL | 2 refills | Status: DC
  Filled 2020-12-26: qty 60, 30d supply, fill #0

## 2020-12-26 MED ORDER — DICLOFENAC SODIUM 1 % EX GEL
2.0000 g | Freq: Four times a day (QID) | CUTANEOUS | 1 refills | Status: DC
  Filled 2020-12-26: qty 100, 10d supply, fill #0

## 2020-12-26 NOTE — Discharge Instructions (Addendum)
[name],  It was a pleasure taking care of you at [facility] were admitted for a stomach bleed and found to have a gastric ulcer. This was addressed by the stomach doctors. We are discharging you home now that you are doing better. Please follow the following instructions.  1) Start taking protonix 40 mg twice daily 2) Avoid medications that include Ibuprofen such as Motril and Advil 3) Follow up with the internal medicine clinic next week.   Take care,  Dr. Linwood Dibbles, MD, MPH

## 2020-12-26 NOTE — Discharge Summary (Signed)
Name: [name] MRN: [id number] DOB: September 08, 1940 80 y.o. PCP: Mike Craze, DO  Date of Admission: 12/23/2020  7:14 AM Date of Discharge:  12/26/2020 Attending Physician: Dr. Dareen Piano  Discharge Diagnosis: Acute GI bleed/hemorrhagic shock AKI on CKD 3  Discharge Medications: Allergies as of 12/26/2020       Reactions   Aspirin Nausea And Vomiting   Other reaction(s): Other (See Comments)        Medication List     STOP taking these medications    donepezil 10 MG tablet Commonly known as: ARICEPT   Potassium Chloride ER 20 MEQ Tbcr   torsemide 20 MG tablet Commonly known as: DEMADEX       TAKE these medications    Accu-Chek Guide test strip Generic drug: glucose blood USE AS INSTRUCTED TO CHECK BLOOD SUGAR 3 TIMES DAILY.   Accu-Chek Guide test strip Generic drug: glucose blood USE AS INSTRUCTED TO CHECK BLOOD SUGAR 3 TIMES DAILY.   Accu-Chek Softclix Lancets lancets 1 each by Other route 3 (three) times daily. Use as instructed to check blood sugar 3 times per day dx code E11.65   Lancets Misc 1 each by Other route.   amLODipine 10 MG tablet Commonly known as: NORVASC TAKE 1 TABLET BY MOUTH EVERY MORNING What changed: when to take this   B-D UF III MINI PEN NEEDLES 31G X 5 MM Misc Generic drug: Insulin Pen Needle USE TO INJECT INSULIN THREE TIMES A DAY What changed: additional instructions   diclofenac Sodium 1 % Gel Commonly known as: VOLTAREN Apply 2 g topically 4 (four) times daily.   FreeStyle Libre 2 Sensor Misc 2 Devices by Does not apply route every 14 (fourteen) days.   gabapentin 100 MG capsule Commonly known as: NEURONTIN TAKE 1 CAPSULE BY MOUTH TWICE DAILY   HumaLOG Mix 75/25 KwikPen (75-25) 100 UNIT/ML Kwikpen Generic drug: Insulin Lispro Prot & Lispro INJECT 10 UNITS SUBCUTANEOUSLY ONCE DAILY What changed: See the new instructions.   IMMUNE FORMULA PO Take 1 tablet by mouth daily.   insulin lispro 100 UNIT/ML  KwikPen Commonly known as: HumaLOG KwikPen 3 units before dinner What changed:  how much to take how to take this when to take this   levETIRAcetam 500 MG tablet Commonly known as: KEPPRA TAKE 1 TABLET BY MOUTH TWICE DAILY   Medical Compression Stockings Misc Wear as much as possible while awake to reduce swelling   memantine 10 MG tablet Commonly known as: NAMENDA TAKE 1 TABLET BY MOUTH TWICE DAILY   Multi For Her 50+ Tabs Take 1 tablet by mouth daily.   olmesartan 20 MG tablet Commonly known as: BENICAR Take 10 mg by mouth daily.   pantoprazole 40 MG tablet Commonly known as: PROTONIX Take 1 tablet (40 mg total) by mouth 2 (two) times daily.   triamcinolone ointment 0.5 % Commonly known as: KENALOG APPLY 1 APPLICATION TOPICALLY TO RASH TWICE DAILY FOR ITCHING What changed: See the new instructions.   Trulicity 1.5 JO/8.4ZY Sopn Generic drug: Dulaglutide INJECT 1.5MG  SUBCUTANEOUSLY EVERY WEEK ON THURSDAY What changed: See the new instructions.        Disposition and follow-up:   Ms.Hayley L Reimers was discharged from [facility] in Stable condition.  At the hospital follow up visit please address:  1.  Follow-up:  a.  GI bleed/hemorrhagic shock: Admitted to the ICU for hemorrhagic shock. Improved after blood transfusion. Please reevaluate for continual resolution of her symptoms and check a CBC.  Patient was started on a PPI and advised to avoid NSAIDs.    b.  AKI on CKD 3: Resolved after blood transfusion. Needs a BMP at hospital follow-up.   c.  Hypertension: Patient was in ICU for hemorrhagic shock. BP was elevated at discharge so her amlodipine and olmesartan were restarted. Her torsemide was held. Recheck BP and make medication changes as needed for BP control.  2.  Labs / imaging needed at time of follow-up: CBC, BMP  3.  Pending labs/ test needing follow-up: None  Follow-up Appointments:   Hospital follow-up at [facility] clinic  next  Hospital Course by problem list: Upper GI bleed:  Patient had acute blood loss anemia in the setting of upper GI bleed. Hemoglobin on admission was 5.5. Patient became hypotensive requiring ICU admission on 11/14 for hemorrhagic shock.  She received 3 units of PRBCs but no pressors were required. She had a EGD on 11/15 that showed reflux esophagitis, small hiatal hernia and occlusive gastric ulcer which was injected with epinephrine and clipped with achievement of hemostasis. Hemoglobin improved to 12.2 on 11/16 with improvement in blood pressure so patient was transferred out of the ICU. She was discharged on 11/17 with a stable hemoglobin of 12.8.  She was discharged home on Protonix 40 mg twice daily and instructed to follow-up with her PCP for repeat CBC.   AKI on CKD 3 Patient was found to have a creatinine bump to 2.3 to on admission secondary to hypovolemic shock.  Kidney function improved after blood transfusion and IV fluid resuscitation.  Creatinine improved to 1.63 on day of discharge.  Needs a repeat BMP at hospital follow-up.   Type 2 diabetes mellitus Poorly controlled diabetes with A1c of 7.8% 4 weeks ago. Home medications Trulicity 1.5 mg weekly, Humalog mix 8 units in the a.m. and 5 units in the p.m. she received sliding scale insulin and CBG monitoring during her admission. Her home antidiabetic regimen was restarted at discharge.  HFpEF Hypertension Patient became hypotensive to SBP in the 90s on admission requiring ICU admission for hemorrhagic shock. BP improved after fluid resuscitation and blood transfusion. SBP improved to the 120s to 150s at discharge. Her amlodipine 10 mg and olmesartan 10 mg were restarted at discharge but her torsemide was held. She will need BP check at hospital follow-up next week.   Seizure disorder We will continue her Keppra 500 mg twice a day  Bradycardia EKG on admission showed normal sinus rhythm with a rate of 68. Patient denied any  symptoms of lightheadedness or dizziness on day of discharge. Heart rate remained in the 50s to 60s. Per chart review, patient has donepezil was recently stopped by her nephrologist due to bradycardia. We did not continue this medication at discharge.    Subjective: Patient was evaluated at the bedside laying comfortably in bed. She denies any dizziness, headaches, shortness of breath or chest pain. Patient excited about going home today.  Informed of plan for her to follow-up with [facility] clinic next week.  Discharge Vitals:   BP (!) 143/42 (BP Location: Right Arm)   Pulse (!) 57   Temp 98.9 F (37.2 C)   Resp 16   Ht 5\' 3"  (1.6 m)   Wt 65.9 kg   LMP  (LMP Unknown)   SpO2 97%   BMI 25.74 kg/m   General: Pleasant, well-appearing elderly woman laying in bed. No acute distress. CV: RRR. No murmurs, rubs, or gallops. No LE edema Pulmonary: Lungs CTAB. Normal effort.  No wheezing or rales. Abdominal: Soft, nontender, nondistended. Normal bowel sounds. Extremities: Palpable radial and DP pulses. Normal ROM. Skin: Warm and dry. No obvious rash or lesions. Neuro: A&Ox3. No focal deficit. Psych: Normal mood and affect  Pertinent Labs, Studies, and Procedures:  CBC Latest Ref Rng & Units 12/26/2020 12/25/2020 12/24/2020  WBC 4.0 - 10.5 K/uL 6.1 6.0 8.4  Hemoglobin 12.0 - 15.0 g/dL 12.8 12.2 11.4(L)  Hematocrit 36.0 - 46.0 % 37.6 35.0(L) 32.3(L)  Platelets 150 - 400 K/uL 96(L) 93(L) 84(L)    CMP Latest Ref Rng & Units 12/26/2020 12/25/2020 12/24/2020  Glucose 70 - 99 mg/dL 196(H) 135(H) 75  BUN 8 - 23 mg/dL 19 24(H) 35(H)  Creatinine 0.44 - 1.00 mg/dL 1.63(H) 1.49(H) 1.65(H)  Sodium 135 - 145 mmol/L 137 140 143  Potassium 3.5 - 5.1 mmol/L 4.4 4.5 4.1  Chloride 98 - 111 mmol/L 106 109 114(H)  CO2 22 - 32 mmol/L 25 26 22   Calcium 8.9 - 10.3 mg/dL 8.5(L) 8.9 8.0(L)  Total Protein 6.5 - 8.1 g/dL - - -  Total Bilirubin 0.3 - 1.2 mg/dL - - -  Alkaline Phos 38 - 126 U/L - - -  AST 15 - 41  U/L - - -  ALT 0 - 44 U/L - - -   CXR:   IMPRESSION: Progressive, moderate cardiomegaly. Echocardiography may be helpful to assess for the presence of an underlying pericardial effusion. Central pulmonary vascular engorgement without overt pulmonary edema. Enlargement of the central pulmonary arteries in keeping with pulmonary arterial hypertension.     Discharge Instructions: Ms. Group,  It was a pleasure taking care of you at [facility] were admitted for a stomach bleed and found to have a gastric ulcer. This was addressed by the stomach doctors. We are discharging you home now that you are doing better. Please follow the following instructions.  1) Start taking protonix 40 mg twice daily 2) Avoid medications that include Ibuprofen such as Motril and Advil 3) Follow up with the internal medicine clinic next week.   Take care,  Dr. Linwood Dibbles, MD, MPH  Signed: Lacinda Axon, MD 12/26/2020, 2:45 PM   Pager: [phone]

## 2020-12-26 NOTE — Plan of Care (Incomplete Revision)
Pt being discharged home to care of husband. Pres[facility]. PIV d/c'd and Port de-accessed by IV team. AVS and medication details reviewed with pt and her husband. Pt had no concerns upon discharge. Follow up information reinforced. No personal belongings left at bedside.    Problem: Education: Goal: Knowledge of General Education information will improve Description: Including pain rating scale, medication(s)/side effects and non-pharmacologic comfort measures Outcome: Adequate for Discharge   Problem: Health Behavior/Discharge Planning: Goal: Ability to manage health-related needs will improve Outcome: Adequate for Discharge   Problem: Clinical Measurements: Goal: Ability to maintain clinical measurements within normal limits will improve Outcome: Adequate for Discharge Goal: Will remain free from infection Outcome: Adequate for Discharge Goal: Diagnostic test results will improve Outcome: Adequate for Discharge Goal: Respiratory complications will improve Outcome: Adequate for Discharge Goal: Cardiovascular complication will be avoided Outcome: Adequate for Discharge

## 2020-12-26 NOTE — Telephone Encounter (Signed)
-----   Message from Lacinda Axon, MD sent at 12/26/2020 10:11 AM EST ----- Regarding: Hospital F/u Please schedule a hospital f/u appt for this patient next week. We are discharging her today. Thanks.  ~Dr. Coy Saunas

## 2020-12-26 NOTE — Telephone Encounter (Signed)
TOC HFU appointment 01/01/21 at 10:15 am.

## 2020-12-26 NOTE — Plan of Care (Signed)
Pt being discharged home to care of husband. Pres[facility]. PIV d/c'd and Port de-accessed by IV team. AVS and medication details reviewed with pt and her husband. Pt had no concerns upon discharge. Follow up information reinforced. No personal belongings left at bedside.    Problem: Education: Goal: Knowledge of General Education information will improve Description: Including pain rating scale, medication(s)/side effects and non-pharmacologic comfort measures Outcome: Adequate for Discharge   Problem: Health Behavior/Discharge Planning: Goal: Ability to manage health-related needs will improve Outcome: Adequate for Discharge   Problem: Clinical Measurements: Goal: Ability to maintain clinical measurements within normal limits will improve Outcome: Adequate for Discharge Goal: Will remain free from infection Outcome: Adequate for Discharge Goal: Diagnostic test results will improve Outcome: Adequate for Discharge Goal: Respiratory complications will improve Outcome: Adequate for Discharge Goal: Cardiovascular complication will be avoided Outcome: Adequate for Discharge

## 2020-12-26 NOTE — Care Management Important Message (Signed)
Important Message  Patient Details  Name: [name] MRN: [id number] Date of Birth: November 03, 1940   Medicare Important Message Given:  Yes     Joetta Manners 12/26/2020, 2:27 PM

## 2020-12-27 ENCOUNTER — Other Ambulatory Visit: Payer: Self-pay | Admitting: Internal Medicine

## 2020-12-27 LAB — TYPE AND SCREEN
ABO/RH(D): A POS
Antibody Screen: NEGATIVE
Unit division: 0
Unit division: 0
Unit division: 0

## 2020-12-27 LAB — BPAM RBC
Blood Product Expiration Date: 202212052359
Blood Product Expiration Date: 202212052359
Blood Product Expiration Date: 202212072359
ISSUE DATE / TIME: 202211140914
Unit Type and Rh: 6200
Unit Type and Rh: 6200
Unit Type and Rh: 6200

## 2020-12-31 ENCOUNTER — Emergency Department (HOSPITAL_COMMUNITY): Payer: Medicare Other

## 2020-12-31 ENCOUNTER — Observation Stay (HOSPITAL_COMMUNITY)
Admission: EM | Admit: 2020-12-31 | Discharge: 2021-01-01 | Disposition: A | Discharge status: Home / Self Care | Payer: Medicare Other | Attending: Emergency Medicine | Admitting: Emergency Medicine

## 2020-12-31 ENCOUNTER — Encounter (HOSPITAL_COMMUNITY): Payer: Self-pay | Admitting: Internal Medicine

## 2020-12-31 DIAGNOSIS — Z794 Long term (current) use of insulin: Secondary | ICD-10-CM | POA: Insufficient documentation

## 2020-12-31 DIAGNOSIS — I5032 Chronic diastolic (congestive) heart failure: Secondary | ICD-10-CM | POA: Insufficient documentation

## 2020-12-31 DIAGNOSIS — N183 Chronic kidney disease, stage 3 unspecified: Secondary | ICD-10-CM | POA: Diagnosis not present

## 2020-12-31 DIAGNOSIS — Z79899 Other long term (current) drug therapy: Secondary | ICD-10-CM | POA: Diagnosis not present

## 2020-12-31 DIAGNOSIS — Z7985 Long-term (current) use of injectable non-insulin antidiabetic drugs: Secondary | ICD-10-CM | POA: Diagnosis not present

## 2020-12-31 DIAGNOSIS — R531 Weakness: Secondary | ICD-10-CM

## 2020-12-31 DIAGNOSIS — R2689 Other abnormalities of gait and mobility: Secondary | ICD-10-CM | POA: Diagnosis not present

## 2020-12-31 DIAGNOSIS — R4182 Altered mental status, unspecified: Principal | ICD-10-CM | POA: Insufficient documentation

## 2020-12-31 DIAGNOSIS — N179 Acute kidney failure, unspecified: Secondary | ICD-10-CM | POA: Insufficient documentation

## 2020-12-31 DIAGNOSIS — E119 Type 2 diabetes mellitus without complications: Secondary | ICD-10-CM | POA: Diagnosis not present

## 2020-12-31 DIAGNOSIS — R410 Disorientation, unspecified: Secondary | ICD-10-CM

## 2020-12-31 DIAGNOSIS — Z20822 Contact with and (suspected) exposure to covid-19: Secondary | ICD-10-CM | POA: Insufficient documentation

## 2020-12-31 DIAGNOSIS — I130 Hypertensive heart and chronic kidney disease with heart failure and stage 1 through stage 4 chronic kidney disease, or unspecified chronic kidney disease: Secondary | ICD-10-CM | POA: Insufficient documentation

## 2020-12-31 LAB — COMPREHENSIVE METABOLIC PANEL
ALT: 18 U/L (ref 0–44)
AST: 28 U/L (ref 15–41)
Albumin: 3.1 g/dL — ABNORMAL LOW (ref 3.5–5.0)
Alkaline Phosphatase: 107 U/L (ref 38–126)
Anion gap: 9 (ref 5–15)
BUN: 26 mg/dL — ABNORMAL HIGH (ref 8–23)
CO2: 21 mmol/L — ABNORMAL LOW (ref 22–32)
Calcium: 9.1 mg/dL (ref 8.9–10.3)
Chloride: 114 mmol/L — ABNORMAL HIGH (ref 98–111)
Creatinine, Ser: 2.41 mg/dL — ABNORMAL HIGH (ref 0.44–1.00)
GFR, Estimated: 20 mL/min — ABNORMAL LOW (ref >60)
Glucose, Bld: 164 mg/dL — ABNORMAL HIGH (ref 70–99)
Potassium: 4.8 mmol/L (ref 3.5–5.1)
Sodium: 144 mmol/L (ref 135–145)
Total Bilirubin: 1 mg/dL (ref 0.3–1.2)
Total Protein: 6 g/dL — ABNORMAL LOW (ref 6.5–8.1)

## 2020-12-31 LAB — URINALYSIS, ROUTINE W REFLEX MICROSCOPIC
Bilirubin Urine: NEGATIVE
Glucose, UA: NEGATIVE mg/dL
Hgb urine dipstick: NEGATIVE
Ketones, ur: 5 mg/dL — AB
Leukocytes,Ua: NEGATIVE
Nitrite: NEGATIVE
Protein, ur: NEGATIVE mg/dL
Specific Gravity, Urine: 1.01 (ref 1.005–1.030)
pH: 7 (ref 5.0–8.0)

## 2020-12-31 LAB — CBC WITH DIFFERENTIAL/PLATELET
Abs Immature Granulocytes: 0.02 10*3/uL (ref 0.00–0.07)
Basophils Absolute: 0 10*3/uL (ref 0.0–0.1)
Basophils Relative: 1 %
Eosinophils Absolute: 0.3 10*3/uL (ref 0.0–0.5)
Eosinophils Relative: 6 %
HCT: 40.4 % (ref 36.0–46.0)
Hemoglobin: 13.1 g/dL (ref 12.0–15.0)
Immature Granulocytes: 0 %
Lymphocytes Relative: 21 %
Lymphs Abs: 1.1 10*3/uL (ref 0.7–4.0)
MCH: 30.2 pg (ref 26.0–34.0)
MCHC: 32.4 g/dL (ref 30.0–36.0)
MCV: 93.1 fL (ref 80.0–100.0)
Monocytes Absolute: 0.5 10*3/uL (ref 0.1–1.0)
Monocytes Relative: 9 %
Neutro Abs: 3.3 10*3/uL (ref 1.7–7.7)
Neutrophils Relative %: 63 %
Platelets: 100 10*3/uL — ABNORMAL LOW (ref 150–400)
RBC: 4.34 MIL/uL (ref 3.87–5.11)
RDW: 14.6 % (ref 11.5–15.5)
Smear Review: NORMAL
WBC: 5.4 10*3/uL (ref 4.0–10.5)
nRBC: 0 % (ref 0.0–0.2)

## 2020-12-31 LAB — I-STAT CHEM 8, ED
BUN: 26 mg/dL — ABNORMAL HIGH (ref 8–23)
Calcium, Ion: 1.09 mmol/L — ABNORMAL LOW (ref 1.15–1.40)
Chloride: 116 mmol/L — ABNORMAL HIGH (ref 98–111)
Creatinine, Ser: 2.6 mg/dL — ABNORMAL HIGH (ref 0.44–1.00)
Glucose, Bld: 160 mg/dL — ABNORMAL HIGH (ref 70–99)
HCT: 37 % (ref 36.0–46.0)
Hemoglobin: 12.6 g/dL (ref 12.0–15.0)
Potassium: 4.7 mmol/L (ref 3.5–5.1)
Sodium: 147 mmol/L — ABNORMAL HIGH (ref 135–145)
TCO2: 23 mmol/L (ref 22–32)

## 2020-12-31 LAB — RESP PANEL BY RT-PCR (FLU A&B, COVID) ARPGX2
Influenza A by PCR: NEGATIVE
Influenza B by PCR: NEGATIVE
SARS Coronavirus 2 by RT PCR: NEGATIVE

## 2020-12-31 LAB — POC OCCULT BLOOD, ED: Fecal Occult Bld: NEGATIVE

## 2020-12-31 LAB — TYPE AND SCREEN
ABO/RH(D): A POS
Antibody Screen: NEGATIVE

## 2020-12-31 MED ORDER — SODIUM CHLORIDE 0.9 % IV BOLUS
500.0000 mL | Freq: Once | INTRAVENOUS | Status: AC
  Administered 2020-12-31: 500 mL via INTRAVENOUS

## 2020-12-31 MED ORDER — LEVETIRACETAM 500 MG PO TABS
500.0000 mg | ORAL_TABLET | Freq: Two times a day (BID) | ORAL | Status: DC
  Administered 2020-12-31 – 2021-01-01 (×2): 500 mg via ORAL
  Filled 2020-12-31 (×2): qty 1

## 2020-12-31 MED ORDER — AMLODIPINE BESYLATE 10 MG PO TABS
10.0000 mg | ORAL_TABLET | Freq: Every morning | ORAL | Status: DC
  Administered 2021-01-01: 10 mg via ORAL
  Filled 2020-12-31: qty 1

## 2020-12-31 MED ORDER — INSULIN ASPART 100 UNIT/ML IJ SOLN
0.0000 [IU] | Freq: Three times a day (TID) | INTRAMUSCULAR | Status: DC
  Administered 2021-01-01: 2 [IU] via SUBCUTANEOUS
  Administered 2021-01-01: 3 [IU] via SUBCUTANEOUS

## 2020-12-31 MED ORDER — ENOXAPARIN SODIUM 30 MG/0.3ML IJ SOSY
30.0000 mg | PREFILLED_SYRINGE | INTRAMUSCULAR | Status: DC
  Administered 2021-01-01: 30 mg via SUBCUTANEOUS
  Filled 2020-12-31: qty 0.3

## 2020-12-31 MED ORDER — ACETAMINOPHEN 650 MG RE SUPP: 650.0000 mg | Freq: Four times a day (QID) | RECTAL | Status: DC | PRN

## 2020-12-31 MED ORDER — PANTOPRAZOLE SODIUM 40 MG PO TBEC
40.0000 mg | DELAYED_RELEASE_TABLET | Freq: Two times a day (BID) | ORAL | Status: DC
  Administered 2020-12-31 – 2021-01-01 (×2): 40 mg via ORAL
  Filled 2020-12-31 (×2): qty 1

## 2020-12-31 MED ORDER — PANTOPRAZOLE SODIUM 40 MG IV SOLR: 40.0000 mg | Freq: Once | INTRAVENOUS | Status: DC

## 2020-12-31 MED ORDER — ACETAMINOPHEN 325 MG PO TABS: 650.0000 mg | ORAL_TABLET | Freq: Four times a day (QID) | ORAL | Status: DC | PRN

## 2020-12-31 MED ORDER — PANTOPRAZOLE SODIUM 40 MG IV SOLR
40.0000 mg | Freq: Once | INTRAVENOUS | Status: AC
  Administered 2020-12-31: 40 mg via INTRAVENOUS
  Filled 2020-12-31: qty 40

## 2020-12-31 NOTE — Procedures (Signed)
Patient Name: [name]  MRN: [id number]  Epilepsy Attending: Lora Havens  Referring Physician/Provider: Dr Su Monks Date: 12/31/2020  Duration: 23.14 mins  Patient history: 80yo F with ams. EEG to evaluate for seizure  Level of alertness: Awake  AEDs during EEG study: None  Technical aspects: This EEG study was done with scalp electrodes positioned according to the 10-20 International system of electrode placement. Electrical activity was acquired at a sampling rate of 500Hz  and reviewed with a high frequency filter of 70Hz  and a low frequency filter of 1Hz . EEG data were recorded continuously and digitally stored.   Description: No posterior dominant rhythm was seen. EEG showed continuous generalized 3 to 6 Hz theta-delta slowing.Intermittent generalized periodic discharges with triphasic morphology at 1-1.5 Hz were also noted, more prominent when awake/stimulated. Hyperventilation and photic stimulation were not performed.     ABNORMALITY - Periodic discharges with triphasic morphology, generalized ( GPDs) - Continuous slow, generalized  IMPRESSION: This study generalized periodic discharges with triphasic morphology at 1-1.5 Hz which can be on the ictal-interictal continuum. However, the frequency and morphology as well as reactivity to stimulation is more commonly due to toxic-metabolic causes like cefepime toxicity, hyperammonemia, uremia. Additionally there is moderate diffuse encephalopathy, nonspecific etiology. No seizures  were seen throughout the recording.  Bessie Boyte Barbra Sarks

## 2020-12-31 NOTE — ED Provider Notes (Signed)
[facility] EMERGENCY DEPARTMENT Provider Note   CSN: [id number] Arrival date & time: 12/31/20  1623     History Chief Complaint  Patient presents with   AMS   GI Bleeding    [name] is a 80 y.o. female.  HPI  80 year old female with a history of anemia, CHF, GI bleeding, gastric AV malformation, GERD, heart murmur, hypertension, hyperlipidemia, lichen planus, pneumonia, pulmonary hypertension, seizures, diabetes, Osler-Weber-Rendu syndrome, who presents to the emergency department today for evaluation of altered mental status.  Patient was last known well last night before going to bed.  This morning her husband noted that she has been difficult to arouse and that she is confused.  She is normally oriented and calm for stational.  She has had no complaints recently.  There is a level 5 caveat as the patient is confused`  Past Medical History:  Diagnosis Date   Chronic anemia    Chronic diastolic CHF (congestive heart failure) ([place]) 10/03/2013   Chronic GI bleeding    /notes 11/29/2014   Family history of anesthesia complication    "niece has a hard time coming out" (09/15/2012)   Frequent nosebleeds    chronic   Gastric AV malformation    /notes 11/29/2014   GERD (gastroesophageal reflux disease)    Heart murmur 04/01/2017   Moderate AVSC on echo 09/2016   History of blood transfusion "several"   History of epistaxis    HTN (hypertension), benign 03/02/2012   Hyperlipidemia    Iron deficiency anemia    chronic infusions"   Lichen planus    Both lower extremities   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    Patient Active Problem List   Diagnosis  Date Noted   Acute upper GI bleed 12/23/2020   Hemorrhagic shock ([place])    Bullous lesion 10/08/2020   Hypoglycemia 09/29/2020   Impacted cerumen of left ear 07/15/2020   Hearing loss 05/29/2020   Abnormal LFTs 08/09/2019   Hypervolemia due to congestive heart failure ([place]) 08/02/2019   Neuropathy 07/27/2019   Hyperkalemia 10/18/2018   Port-A-Cath in place 05/23/2018   Iron deficiency anemia due to chronic blood loss 04/19/2018   Acute right hip pain 03/10/2018   Hyperpigmented skin lesion 02/07/2018   Leg weakness, bilateral 12/23/2017   Hematemesis 12/04/2017   Pain localized to upper abdomen 10/05/2017   Lower extremity pain, bilateral 07/15/2017   Pulmonary HTN ([place]) 04/01/2017   Heart murmur 04/01/2017   Overgrown toenails 03/18/2017   Osteopenia after menopause 01/13/2017   Epistaxis 07/30/2016   Venous insufficiency of both lower extremities 04/27/2016   Anemia due to chronic blood loss    Dry skin 02/17/2016   Peripheral vascular disease ([place]) 12/30/2015   Pain in joint of right shoulder 10/22/2015   PAF (paroxysmal atrial fibrillation) ([place]) 24/10/7351   Lichen planus 29/92/4268   Healthcare maintenance 06/20/2015   Seborrheic keratosis 06/20/2015   Memory loss 01/30/2015   Simple febrile convulsions ([place]) 10/21/2014   Hyperammonemia ([place]) 10/03/2014   CKD (chronic kidney disease), stage III ([name]    Seizures ([place]) 09/2014   Acute kidney injury ([place])  08/27/2014   Chronic diastolic CHF (congestive heart failure) ([place]) 10/03/2013   Acute on chronic heart failure with preserved ejection fraction (HFpEF) ([place]) 10/03/2013   Type 2 diabetes mellitus without complication ([place]) 09/22/4816   HTN (hypertension), benign 03/02/2012   Fatigue 07/10/2011   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    Past Surgical History:  Procedure Laterality Date   CATARACT EXTRACTION     "I think it was just one eye"    ESOPHAGOGASTRODUODENOSCOPY  02/26/2011   Procedure: ESOPHAGOGASTRODUODENOSCOPY (EGD);  Surgeon: [name], MD;  Location: [name];  Service: Endoscopy;  Laterality: N/A;   ESOPHAGOGASTRODUODENOSCOPY N/A 11/08/2012   Procedure: ESOPHAGOGASTRODUODENOSCOPY (EGD);  Surgeon: [name], MD;  Location: [name];  Service: Endoscopy;  Laterality: N/A;   ESOPHAGOGASTRODUODENOSCOPY N/A 10/04/2013   Procedure: ESOPHAGOGASTRODUODENOSCOPY (EGD);  Surgeon: Winfield Cunas., MD;  Location: [name];  Service: Endoscopy;  Laterality: N/A;  with APC on stand-by   ESOPHAGOGASTRODUODENOSCOPY N/A 07/06/2014   Procedure: ESOPHAGOGASTRODUODENOSCOPY (EGD);  Surgeon: Clarene Essex, MD;  Location: [name];  Service: Endoscopy;  Laterality: N/A;   ESOPHAGOGASTRODUODENOSCOPY N/A 09/05/2014   Procedure: ESOPHAGOGASTRODUODENOSCOPY (EGD);  Surgeon: Laurence Spates, MD;  Location: [name];  Service: Endoscopy;  Laterality: N/A;  APC on standby to control bleeding   ESOPHAGOGASTRODUODENOSCOPY N/A 11/29/2014   Procedure: ESOPHAGOGASTRODUODENOSCOPY (EGD);  Surgeon: [name], MD;  Location[facility]COPY;  Service: Endoscopy;  Laterality: N/A;   ESOPHAGOGASTRODUODENOSCOPY N/A 09/28/2015   Procedure: ESOPHAGOGASTRODUODENOSCOPY (EGD);  Surgeon: Clarene Essex, MD;  Location: [facility] ENDOSCOPY;  Service: Endoscopy;  Laterality: N/A;   ESOPHAGOGASTRODUODENOSCOPY (EGD) WITH PROPOFOL N/A 12/04/2017   Procedure: ESOPHAGOGASTRODUODENOSCOPY (EGD) WITH PROPOFOL;  Surgeon: [name], MD;  Location: [facility];  Service: Endoscopy;  Laterality: N/A;   ESOPHAGOGASTRODUODENOSCOPY (EGD) WITH PROPOFOL N/A 12/24/2020   Procedure: ESOPHAGOGASTRODUODENOSCOPY (EGD) WITH PROPOFOL;  Surgeon: Arta Silence, MD;  Location: [facility];  Service: Endoscopy;  Laterality: N/A;   ESOPHAGOGASTRODUODENOSCOPY ENDOSCOPY  08/19/2006   with laser treatment   HEMOSTASIS CLIP PLACEMENT  12/24/2020   Procedure: HEMOSTASIS CLIP  PLACEMENT;  Surgeon: Arta Silence, MD;  Location: [facility];  Service: Endoscopy;;   HOT HEMOSTASIS N/A 07/06/2014   Procedure: HOT HEMOSTASIS (ARGON PLASMA COAGULATION/BICAP);  Surgeon: Clarene Essex, MD;  Location: [name];  Service: Endoscopy;  Laterality: N/A;   HOT HEMOSTASIS N/A 09/28/2015   Procedure: HOT HEMOSTASIS (ARGON PLASMA COAGULATION/BICAP);  Surgeon: Clarene Essex, MD;  Location[facility] ENDOSCOPY;  Service: Endoscopy;  Laterality: N/A;   HOT HEMOSTASIS N/A 12/04/2017   Procedure: HOT HEMOSTASIS (ARGON PLASMA COAGULATION/BICAP);  Surgeon: [name], MD;  Location: [facility];  Service: Endoscopy;  Laterality: N/A;   NASAL HEMORRHAGE CONTROL     "for bleeding"    SAVORY DILATION  02/26/2011   Procedure: SAVORY DILATION;  Surgeon: [name], MD;  Location: [name];  Service: Endoscopy;  Laterality: N/A;  c-arm needed   SCLEROTHERAPY  12/24/2020   Procedure: SCLEROTHERAPY;  Surgeon: Arta Silence, MD;  Location: [facility] ENDOSCOPY;  Service: Endoscopy;;   SUBMUCOSAL INJECTION  12/04/2017   Procedure: SUBMUCOSAL INJECTION;  Surgeon: [name], MD;  Location: [facility] ENDOSCOPY;  Service: Endoscopy;;     OB History   No obstetric history on file.     Family History  Problem Relation Age of Onset   Stroke Father    Healthy Mother    Breast cancer Other    Diabetes Niece    Malignant hyperthermia Neg Hx  Seizures Neg Hx     Social History   Tobacco Use   Smoking status: Former    Packs/day: 1.00    Years: 20.00    Pack years: 20.00    Types: Cigarettes    Quit date: 02/10/1971    Years since quitting: 49.9   Smokeless tobacco: Never   Tobacco comments:    09/15/2012 "smoked 50-60 yr ago"  Vaping Use   Vaping Use: Never used  Substance Use Topics   Alcohol use: No    Alcohol/week: 0.0 standard drinks   Drug use: No    Home Medications Prior to Admission medications   Medication Sig Start Date End Date Taking? Authorizing Provider  ACCU-CHEK  GUIDE test strip USE AS INSTRUCTED TO CHECK BLOOD SUGAR 3 TIMES DAILY. 08/08/20   Elayne Snare, MD  Accu-Chek Softclix Lancets lancets 1 each by Other route 3 (three) times daily. Use as instructed to check blood sugar 3 times per day dx code E11.65 02/01/20   Elayne Snare, MD  amLODipine (NORVASC) 10 MG tablet TAKE 1 TABLET BY MOUTH EVERY MORNING Patient taking differently: Take 10 mg by mouth daily. 10/07/20   [name], Areeg N, DO  Continuous Blood Gluc Sensor (FREESTYLE LIBRE 2 SENSOR) MISC 2 Devices by Does not apply route every 14 (fourteen) days. 04/25/20   Elayne Snare, MD  diclofenac Sodium (VOLTAREN) 1 % GEL Apply 2 g topically 4 (four) times daily. 12/26/20   Lacinda Axon, MD  Elastic Bandages & Supports ([place]) MISC Wear as much as possible while awake to reduce swelling 04/27/16   Minus Liberty, MD  gabapentin (NEURONTIN) 100 MG capsule TAKE 1 CAPSULE BY MOUTH TWICE DAILY Patient taking differently: Take 100 mg by mouth 2 (two) times daily. 08/16/20   Katsadouros, Vasilios, MD  glucose blood (ACCU-CHEK GUIDE) test strip USE AS INSTRUCTED TO CHECK BLOOD SUGAR 3 TIMES DAILY. 07/13/20   [provider]  HUMALOG MIX 75/25 KWIKPEN (75-25) 100 UNIT/ML Kwikpen INJECT 10 UNITS SUBCUTANEOUSLY ONCE DAILY Patient taking differently: Inject 8 Units into the skin daily. 04/17/20   Elayne Snare, MD  insulin lispro (HUMALOG KWIKPEN) 100 UNIT/ML KwikPen 3 units before dinner Patient taking differently: Inject 3 Units into the skin daily before supper. 3 units before dinner 10/03/20   Elayne Snare, MD  Insulin Pen Needle (B-D UF III MINI PEN NEEDLES) 31G X 5 MM MISC USE TO INJECT INSULIN THREE TIMES A DAY 12/23/20   Elayne Snare, MD  Lancets MISC 1 each by Other route. 02/01/20   [provider]  levETIRAcetam (KEPPRA) 500 MG tablet TAKE 1 TABLET BY MOUTH TWICE DAILY Patient taking differently: Take 500 mg by mouth 2 (two) times daily. 06/07/20   [name], Areeg N, DO   memantine (NAMENDA) 10 MG tablet TAKE 1 TABLET BY MOUTH TWICE DAILY Patient taking differently: Take 10 mg by mouth 2 (two) times daily. 11/11/20   [name], Areeg N, DO  Misc Natural Products (IMMUNE FORMULA PO) Take 1 tablet by mouth daily.    [provider]  Multiple Vitamins-Minerals (MULTI FOR HER 50+) TABS Take 1 tablet by mouth daily.    [provider]  olmesartan (BENICAR) 20 MG tablet Take 10 mg by mouth daily. 09/26/20   [provider]  pantoprazole (PROTONIX) 40 MG tablet Take 1 tablet (40 mg total) by mouth 2 (two) times daily. 12/26/20   Lacinda Axon, MD  triamcinolone ointment (KENALOG) 0.5 % APPLY 1 APPLICATION TOPICALLY TO RASH TWICE  DAILY FOR ITCHING Patient taking differently: Apply 1 application topically 2 (two) times daily as needed (itching). 08/16/20   Katsadouros, Vasilios, MD  TRULICITY 1.5 XB/2.8UX SOPN INJECT 1.5MG  SUBCUTANEOUSLY EVERY WEEK ON THURSDAY Patient taking differently: Inject 1.5 mg into the skin once a week. ON THURSDAY 07/29/20   Elayne Snare, MD    Allergies    Aspirin  Review of Systems   Review of Systems  Unable to perform ROS: Mental status change   Physical Exam Updated Vital Signs BP (!) 153/67   Pulse 62   Temp 98.5 F (36.9 C) (Rectal)   Resp 10   LMP  (LMP Unknown)   SpO2 98%   Physical Exam Vitals and nursing note reviewed.  Constitutional:      General: She is not in acute distress.    Appearance: She is well-developed.  HENT:     Head: Normocephalic and atraumatic.     Mouth/Throat:     Mouth: Mucous membranes are dry.  Eyes:     Conjunctiva/sclera: Conjunctivae normal.  Cardiovascular:     Rate and Rhythm: Normal rate and regular rhythm.     Heart sounds: Murmur heard.  Pulmonary:     Effort: Pulmonary effort is normal. No respiratory distress.     Breath sounds: Normal breath sounds.  Abdominal:     General: Bowel sounds are normal.     Palpations: Abdomen is soft.     Tenderness:  There is no abdominal tenderness. There is no guarding or rebound.  Musculoskeletal:        General: No swelling.     Cervical back: Neck supple.  Skin:    General: Skin is warm and dry.     Capillary Refill: Capillary refill takes less than 2 seconds.  Neurological:     Mental Status: She is alert.     Comments: Alert, but sleepy. Oriented to self, place. Often answers questions inappropriately and will not follow commands  Psychiatric:        Mood and Affect: Mood normal.    ED Results / Procedures / Treatments   Labs (all labs ordered are listed, but only abnormal results are displayed) Labs Reviewed  CBC WITH DIFFERENTIAL/PLATELET - Abnormal; Notable for the following components:      Result Value   Platelets 100 (*)    All other components within normal limits  COMPREHENSIVE METABOLIC PANEL - Abnormal; Notable for the following components:   Chloride 114 (*)    CO2 21 (*)    Glucose, Bld 164 (*)    BUN 26 (*)    Creatinine, Ser 2.41 (*)    Total Protein 6.0 (*)    Albumin 3.1 (*)    GFR, Estimated 20 (*)    All other components within normal limits  I-STAT CHEM 8, ED - Abnormal; Notable for the following components:   Sodium 147 (*)    Chloride 116 (*)    BUN 26 (*)    Creatinine, Ser 2.60 (*)    Glucose, Bld 160 (*)    Calcium, Ion 1.09 (*)    All other components within normal limits  RESP PANEL BY RT-PCR (FLU A&B, COVID) [id number]  OCCULT BLOOD X 1 CARD TO LAB, STOOL  URINALYSIS, ROUTINE W REFLEX MICROSCOPIC  POC OCCULT BLOOD, ED  TYPE AND SCREEN    EKG None  Radiology CT Head Wo Contrast  Result Date: 12/31/2020 CLINICAL DATA:  Mental status change, unknown cause. Additional history provided: Altered mental status, suspected  GI bleed, lethargy. EXAM: CT HEAD WITHOUT CONTRAST TECHNIQUE: Contiguous axial images were obtained from the base of the skull through the vertex without intravenous contrast. COMPARISON:  Prior head CT examinations 10/03/2014 and  earlier. Brain MRI 08/28/2014. FINDINGS: Brain: Moderate cerebral atrophy.  Comparatively mild cerebellar atrophy. Advanced patchy and ill-defined hypoattenuation within the cerebral white matter, nonspecific but compatible with chronic small vessel ischemic disease. There is no acute intracranial hemorrhage. No demarcated cortical infarct. No extra-axial fluid collection. No evidence of an intracranial mass. No midline shift. Vascular: No hyperdense vessel.  Atherosclerotic calcifications. Skull: Normal. Negative for fracture or focal lesion. Sinuses/Orbits: Visualized orbits show no acute finding. Mild mucosal thickening within the bilateral ethmoid and maxillary sinuses at the imaged levels. Trace mucosal thickening within the right sphenoid sinus. IMPRESSION: No evidence of acute intracranial abnormality. Advanced chronic small vessel ischemic changes within the cerebral white matter. Moderate cerebral atrophy. Comparatively mild cerebellar atrophy. Mild paranasal sinus mucosal thickening at the imaged levels, as described. Electronically Signed   By: Kellie Simmering D.O.   On: 12/31/2020 18:05   DG Chest Port 1 View  Result Date: 12/31/2020 CLINICAL DATA:  Shortness of breath EXAM: PORTABLE CHEST 1 VIEW COMPARISON:  Chest radiograph dated September 29, 2020 FINDINGS: The heart is enlarged. Atherosclerotic calcification of the aortic arch. Prominence of the central pulmonary vessels. No evidence of pulmonary edema or large pleural effusion. Right-sided MediPort with distal tip in the SVC, unchanged. No acute osseous abnormality. IMPRESSION: 1.  Stable cardiomegaly. 2. Pulmonary vascular congestion without evidence of pulmonary edema or pleural effusion. Electronically Signed   By: Keane Police D.O.   On: 12/31/2020 17:51    Procedures Procedures   Medications Ordered in ED Medications  sodium chloride 0.9 % bolus 500 mL (has no administration in time range)  sodium chloride 0.9 % bolus 500 mL (500 mLs  Intravenous New Bag/Given 12/31/20 1722)  pantoprazole (PROTONIX) injection 40 mg (40 mg Intravenous Given 12/31/20 1719)    ED Course  I have reviewed the triage vital signs and the nursing notes.  Pertinent labs & imaging results that were available during my care of the patient were reviewed by me and considered in my medical decision making (see chart for details).    MDM Rules/Calculators/A&P                          80 y/o f presents for eval of ams  Reviewed/interpreted labs CBC with stable hgb CMP with elevated bun/cr, likely prerenal, otherwise reassuring Hemoccult negative Chem 8 with elevated bun/cr, hypernatremia  Reviewed/interpreted imaging CXR -  1.  Stable cardiomegaly. 2. Pulmonary vascular congestion without evidence of pulmonary edema or pleural effusion. CT head - No evidence of acute intracranial abnormality. Advanced chronic small vessel ischemic changes within the cerebral white matter. Moderate cerebral atrophy. Comparatively mild cerebellar atrophy. Mild paranasal sinus mucosal thickening at the imaged levels, as described.  5:30 PM CONSULT with Dr. Quinn Axe with neurology. She recommends EEG, CT head. If there are significant abnormalities with these studies than she recommends hospitalist consult neuro.  Pt with confusion and aki. Will require admission for aki. She will also need to have an eeg completed. Will consult hospitalist. Ivf have been ordered   7:12 PM CONSULT with Dr. Rick Duff who accepts patient for admission   Final Clinical Impression(s) / ED Diagnoses Final diagnoses:  AKI (acute kidney injury) ([place])  Confusion    Rx / DC  Orders ED Discharge Orders     None        Bishop Dublin 12/31/20 1913    Milton Ferguson, MD 12/31/20 2100

## 2020-12-31 NOTE — ED Notes (Signed)
Patient resting in stretcher comfortably. Denies any needs at this time. Family remains at bedside. Patient is alert and oriented to self and situation. Patient follows commands. Call bell within reach. Stretcher in low and locked position. Side rails up x2.

## 2020-12-31 NOTE — Progress Notes (Signed)
EEG complete - results pending 

## 2020-12-31 NOTE — ED Triage Notes (Signed)
Pt came from home d/t AMS and suspected GI bleed. Baseline A&O X4. Was discharged last week from hospital for GI bleed. Pt appears lethargic.   400 ml fluids.   Ems vs;   BP 161/72 HR 62 O2 96%  Cbg 226

## 2020-12-31 NOTE — Hospital Course (Addendum)
Patient's God daughter   Oriented to person only 2014, place ([place]),  At baseline knows where she is the year, and her name.    Went to chruch Sunday. After church she and her husband went. Had lunch with family that afternoon in her normal state.   Talked on the phone Monday.   Tuesday morning she wasn't responding. She wouldn't open her eyes but could tell patient was breathing. Pateint's husband went to his appointment and then when he came back  Patient's husband c alled Goddaughter stating couldn't get her to wake up but could tell she was breathing.   Blood sugar was 168.   Appetite priro to today was fine.  No falls.   Had not taken her medications and had not eaten.   Had a bowel movment this AM. Uses a walker at baseline.   Family have to speak multiple times. 5 years. Doesn't drive anymore. Gradula decline. Purewick. Today patient acted like it was painful to urinate.   Very interactive and alert when awake but does sleep a lot.  Last 5 years been sleeping a lot, last 2 years even worse.   Didn't feel feverish, NO SOB or cough,   Meds:  Gabapentin took last night Omesartan  Pantoprazole  Potassium chloride Humalog 8u in AM and 5u qhs Trulicity  Keppra 500mg  BID (never had seizures)  PMH:  HTN T2DM Ulcer Seizures 2016, no seizure since then.  HFpEF Iron deficiency  pAFIB  HR today is high 50's to low 60's.  Rate and rhythm is regular.  She is not on [facility] or ASA due to bleeding risk.  Her BB is being held due to persistent bradycardia.  PSH:  Port placed for iron transufsions d/t being a hard stick. Came out   SH:  NO alcohol use or tobacco use  FH:

## 2020-12-31 NOTE — H&P (Addendum)
Date: 12/31/2020               Patient Name:  [name] MRN: [id number]  DOB: 02/04/1941 Age / Sex: 80 y.o., female   PCP: [name] Craze, DO         Medical Service: Internal Medicine Teaching Service         Attending Physician: Dr. Milton Ferguson, MD    First Contact: Dr. Ileene [name] Pager: [phone]  Second Contact: Dr. 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 She follows with [facility] and last appointment was 10/08/20. She was discharged 5 days ago for GI 2/2 to gastric ulcer. At the time of interview patient had slight altered mental status so history was taking from pt, her goddaughter, and her husband and was congruent.   Patient's last normal was last night before she went to bed. Per the husband, he tried to wake her up at 9 when he was going out of the house but she did not wake up then. He tried a few times later but she did not wake up. Around 3pm, she still did not get up. She would open her eyes to her name but would not give a reply. He called the goddaughter who instructed him to call EMS.  Husband states since leaving the hospital, she has been at her baseline until this morning. He states she did not endorse any complaints to him.   When inquiring about patient's fluid intake, patient stated she drinks about 3-4 8oz water bottles, 3 cranberry juices of the same size and 1-2 cups of soda. According to the husband, her intake is variable but he stated he did endorse a significant decrease recently. ROS was negative for fevers, chills, headache, vision changes, chest pain, SOB, abdominal pain, bowel changes, melena  or hematochezia, dysuria or increased urinary frequency. Patient was more alert to [place] then what was reported by the ED.    Review of Systems: Review of system negative unless stated in the problem list or HPI.    ED course: Patient presented to the ED via EMS and appeared lethargic. In the ED labwork was performed along with CT head, EKG, and CXR (results below). Hemoccult was done which was negative. ED exam described patient as not following commands and being somnolent. She was given 1 L of fluids by the ED. After AKI was noted on labwork she was admitted.   Past Medical History:  Diagnosis Date   Chronic anemia    Chronic diastolic CHF (congestive heart failure) ([name]) 10/03/2013   Chronic GI bleeding    /notes 11/29/2014   Family history of anesthesia complication    "niece has a hard time coming out" (09/15/2012)   Frequent nosebleeds    chronic   Gastric AV malformation    /notes 11/29/2014   GERD (gastroesophageal reflux disease)    Heart murmur 04/01/2017   Moderate AVSC on echo 09/2016   History of blood transfusion "several"   History of epistaxis    HTN (hypertension), benign 03/02/2012   Hyperlipidemia    Iron deficiency anemia  chronic infusions"   Lichen planus    Both lower extremities   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[name]    [name] 11/29/2014   Overgrown toenails 03/18/2017   Pneumonia 1990's X 2   Pulmonary HTN ([place]) 04/01/2017   PASP 50mmHg on echo 09/2016 and 16mmHg by echo 2019   Seizures ([place]) 09/2014   Symptomatic anemia 11/29/2014   Telangiectasia    Gastric    Type II diabetes mellitus ([place]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    Meds:  Current Outpatient Medications  Medication Instructions   ACCU-CHEK GUIDE test strip USE AS INSTRUCTED TO CHECK BLOOD SUGAR 3 TIMES DAILY.   Accu-Chek Softclix Lancets lancets 1 each, Other, 3 times daily, Use as instructed to check blood sugar 3 times per day dx code E11.65   amLODipine (NORVASC) 10 MG tablet TAKE 1 TABLET BY MOUTH  EVERY MORNING   Continuous Blood Gluc Sensor (FREESTYLE LIBRE 2 SENSOR) MISC 2 Devices, Does not apply, Every 14 days   diclofenac Sodium (VOLTAREN) 2 g, Topical, 4 times daily   Elastic Bandages & Supports (MEDICAL COMPRESSION STOCKINGS) MISC Wear as much as possible while awake to reduce swelling   gabapentin (NEURONTIN) 100 MG capsule TAKE 1 CAPSULE BY MOUTH TWICE DAILY   glucose blood (ACCU-CHEK GUIDE) test strip USE AS INSTRUCTED TO CHECK BLOOD SUGAR 3 TIMES DAILY.   HUMALOG MIX 75/25 KWIKPEN (75-25) 100 UNIT/ML Kwikpen INJECT 10 UNITS SUBCUTANEOUSLY ONCE DAILY   insulin lispro (HUMALOG KWIKPEN) 100 UNIT/ML KwikPen 3 units before dinner   Insulin Pen Needle (B-D UF III MINI PEN NEEDLES) 31G X 5 MM MISC USE TO INJECT INSULIN THREE TIMES A DAY   Lancets MISC 1 each by Other route.   levETIRAcetam (KEPPRA) 500 MG tablet TAKE 1 TABLET BY MOUTH TWICE DAILY   memantine (NAMENDA) 10 MG tablet TAKE 1 TABLET BY MOUTH TWICE DAILY   Misc Natural Products (IMMUNE FORMULA PO) 1 tablet, Oral, Daily   Multiple Vitamins-Minerals (MULTI FOR HER 50+) TABS 1 tablet, Oral, Daily with breakfast   olmesartan (BENICAR) 10 mg, Oral, Daily   pantoprazole (PROTONIX) 40 mg, Oral, 2 times daily   potassium chloride SA (KLOR-CON) 20 MEQ tablet 20 mEq, Oral, 2 times daily   triamcinolone ointment (KENALOG) 0.5 % APPLY 1 APPLICATION TOPICALLY TO RASH TWICE DAILY FOR ITCHING   TRULICITY 1.5 OE/4.2PN SOPN INJECT 1.5MG  SUBCUTANEOUSLY EVERY WEEK ON THURSDAY   Avastin 5mg /kg q6 weeks  Allergies: Allergies as of 12/31/2020 - Review Complete 12/24/2020  Allergen Reaction Noted   Aspirin Nausea And Vomiting 05/12/2006    Family History:  Family History  Problem Relation Age of Onset   Stroke Father    Healthy Mother    Breast cancer Other    Diabetes Niece    Malignant hyperthermia Neg Hx    Seizures Neg Hx     Social History: Patient lives with husband. They live by themselves but have family nearby. Patient is  able to ambulate with a walker and able to perform all ADLs but does require the walker for ambulation. She needs assistance performing all iADLs, She is a former smoker but denies any alcohol or illicit substance use.   Social History   Socioeconomic History   Marital status: Married    Spouse name: [name]   Number of children: 0   Years of education: 9   Highest education level: Not on file  Occupational History   Occupation: Retired Systems developer: RETIRED  Tobacco Use  Smoking status: Former    Packs/day: 1.00    Years: 20.00    Pack years: 20.00    Types: Cigarettes    Quit date: 02/10/1971    Years since quitting: 49.9   Smokeless tobacco: Never   Tobacco comments:    09/15/2012 "smoked 50-60 yr ago"  Vaping Use   Vaping Use: Never used  Substance and Sexual Activity   Alcohol use: No    Alcohol/week: 0.0 standard drinks   Drug use: No   Sexual activity: Not on file  Other Topics Concern   Not on file  Social History Narrative   Married, lives with husband, [name].  Ambulates without assistance.     Caffeine use: none   Social Determinants of Radio broadcast assistant Strain: Not on file  Food Insecurity: Not on file  Transportation Needs: Not on file  Physical Activity: Not on file  Stress: Not on file  Social Connections: Not on file  Intimate Partner Violence: Not on file     Physical Exam: Blood pressure (!) 153/67, pulse 62, temperature 98.5 F (36.9 C), temperature source Rectal, resp. rate 10, SpO2 98 %. General: NAD Head: Normocephalic without scalp lesions.  Eyes: Conjunctivae pink, sclerae white, without jaundice.  Mouth and Throat: Lips normal color, without lesions. Moist mucus membrane. Neck: Neck supple with full range of motion (ROM). No masses or tenderness. No JVD Lungs: CTAB, no wheeze, rhonchi or rales.  Cardiovascular: Normal heart sounds, no r/m/g, 2+ pulses in all extremities Abdomen: No TTP, normal bowel sounds MSK: No  asymmetry or muscle atrophy. Full range of motion (ROM) of all joints. No injuries noted. Good grip strength, 5/5 in BUE, 4/5 in BLE.Trace LE edema present. Skin: warm, dry, poor skin turgor, no lesions Neuro: Alert and oriented. CN II-XII normal Psych: Normal mood and normal affect  CBC Latest Ref Rng & Units 12/31/2020 12/31/2020 12/26/2020  WBC 4.0 - 10.5 K/uL - 5.4 6.1  Hemoglobin 12.0 - 15.0 g/dL 12.6 13.1 12.8  Hematocrit 36.0 - 46.0 % 37.0 40.4 37.6  Platelets 150 - 400 K/uL - 100(L) 96(L)    CMP Latest Ref Rng & Units 12/31/2020 12/31/2020 12/26/2020  Glucose 70 - 99 mg/dL 160(H) 164(H) 196(H)  BUN 8 - 23 mg/dL 26(H) 26(H) 19  Creatinine 0.44 - 1.00 mg/dL 2.60(H) 2.41(H) 1.63(H)  Sodium 135 - 145 mmol/L 147(H) 144 137  Potassium 3.5 - 5.1 mmol/L 4.7 4.8 4.4  Chloride 98 - 111 mmol/L 116(H) 114(H) 106  CO2 22 - 32 mmol/L - 21(L) 25  Calcium 8.9 - 10.3 mg/dL - 9.1 8.5(L)  Total Protein 6.5 - 8.1 g/dL - 6.0(L) -  Total Bilirubin 0.3 - 1.2 mg/dL - 1.0 -  Alkaline Phos 38 - 126 U/L - 107 -  AST 15 - 41 U/L - 28 -  ALT 0 - 44 U/L - 18 -    CT Head Wo Contrast  Result Date: 12/31/2020 CLINICAL DATA:  Mental status change, unknown cause. Additional history provided: Altered mental status, suspected GI bleed, lethargy. EXAM: CT HEAD WITHOUT CONTRAST  IMPRESSION: No evidence of acute intracranial abnormality. Advanced chronic small vessel ischemic changes within the cerebral white matter. Moderate cerebral atrophy. Comparatively mild cerebellar atrophy. Mild paranasal sinus mucosal thickening at the imaged levels, as described. Electronically Signed   By: Kellie Simmering D.O.   On: 12/31/2020 18:05   DG Chest Port 1 View  Result Date: 12/31/2020 CLINICAL DATA:  Shortness of breath EXAM: PORTABLE CHEST  1 VIEW COMPARISON:  Chest radiograph dated September 29, 2020 IMPRESSION: 1.  Stable cardiomegaly. 2. Pulmonary vascular congestion without evidence of pulmonary edema or pleural effusion.  Electronically Signed   By: Keane Police D.O.   On: 12/31/2020 17:51     EKG: personally reviewed my interpretation is normal sinus rhythm with premature beats.   CXR: personally reviewed my interpretation is normal except vascular congestion. No consolidation or pulmonary edema noted.   Assessment & Plan by Problem: Patient is a 80 year old female with past medical hx of HTN, Insulin Dependant-DMII, hx of seizures and hx of GI bleed who presented to the ED with acute onset altered mental status and decreased responsiveness and found to have AKI on labs.   Altered Mental Status Patient presented with decreased responsiveness and altered mental status since morning of 12/31/20. Ddx include Dehydration vs Seizure vs UTI vs Bacterial vs Viral PNA vs Stroke. It appears her symptoms may have been precipitated by dehydration. This is supported by her hx of inconsistent fluid intake and her endorsement of drinking 3-4 8oz water bottles. Because of the inconsistency in how much she drinks day to day this makes dehydration likely. This is further supported by her AKI which appears secondary to dehydration. Another likely is seizures as patient has seizures starting in 09/2014 when she was started on Keppra. When she had these episodes, she would sleep 4-5 hrs after the seizure. Although initially when she had the seizure, the husband would notice the jerking of the limbs. This was not present during this time and no tongue laceration was noted making this diagnosis less likely. She also has no recent hx of seizures. UTI is least likely given lack of symptoms and normal UA and normal leukocyte count. Bacterial or viral pneumonia are least likely due to lack of supporting findings on the CXR. Stroke was considered due to hx of diabetes and htn but is less likely given patient's normal physical exam and the non-contrast CT of the head was negative for any acute findings.  -TSH, repeat BMP, Ammonia, BHB, EEG -IV  fluid resuscitation -Education on importance of adequate and consistent water intake before discharge   AKI on CKD Patient creatinine is elevated at 2.4 with baseline around 1.6. This appears to be secondary to pre-renal etiology as patient has endorsed inconsistent PO intake. Less likely post renal as patient is making appropriate amount of urine. Intrinsic injury was considered but appears less likely given normal UA with no LE, no proteins, or casts.  -IV hydration -Trend Cr -Encourage PO hydration -Avoid nephrotoxic agents -Renally dose medications  Chronic Problems  HTN Patient has htn that is chronic for this patient and home meds include Norvasc 10 mg qd and Olmesartan 10 mg qd. Patient's husband denies checking BP at home. -Continue Norvasc 10 mg qd -Holding Olmesartan 10 mg qd due to AKI  DMII Patient has DMII that is insulin dependant. This is a chronic problem for this patient. Home meds include Trulicity 1.5 mg weekly, insulin lispro 3 units before dinner, and 10 units of 75/25 mix daily. A1c in 11/28/20 was 7.8. -CBG x4 with meals and bedtime -SSI  GERD Hx of gastric ulcer Patient has hx of GERD and is on Protonix 40 mg BID. Patient received IV Protonix 40 mg in the ED. Patient had GI bleeding in the last admission and was found to have a gastric ulcer on endoscopy. No H. Pylori testing ordered. -Continue home dose of Protonix 40 mg  BID.  -H. Pylori testing outpatient  Hx of Seizure Patient had episodes of seizures in 2016 and was placed on Keppra 500 mg BID. Her goddaughter does not report any recent episodes of seizures. -Continue Keppra 500 mg BID  Hx of Thrombocytopenia Patient platelet count is low at 103 but this is appears chronic. She had some thrombocytopenia in 2020 and she started Avastin at that time. It was held in 2021 and her platelet count recovered. I suspect this may be due to her Avastin.   -Continue to monitor  -Outpatient workup  DVT prophx:  Lovenox Diet: Heart healthy Bowel: PRN Code:Full  Prior to Admission Living Arrangement: Home Anticipated Discharge Location:Home Barriers to Discharge:Medical Workup  Dispo: Admit patient to Observation with expected length of stay less than 2 midnights.  Idamae Schuller, MD Tillie [facility]. [facility] Internal Medicine Residency, PGY-1  Pager: [phone]

## 2021-01-01 ENCOUNTER — Encounter (HOSPITAL_COMMUNITY): Payer: Self-pay | Admitting: Internal Medicine

## 2021-01-01 ENCOUNTER — Other Ambulatory Visit: Payer: Self-pay

## 2021-01-01 ENCOUNTER — Encounter: Payer: Medicare Other | Admitting: Internal Medicine

## 2021-01-01 DIAGNOSIS — R4182 Altered mental status, unspecified: Secondary | ICD-10-CM

## 2021-01-01 LAB — BASIC METABOLIC PANEL
Anion gap: 6 (ref 5–15)
BUN: 21 mg/dL (ref 8–23)
CO2: 20 mmol/L — ABNORMAL LOW (ref 22–32)
Calcium: 8.8 mg/dL — ABNORMAL LOW (ref 8.9–10.3)
Chloride: 117 mmol/L — ABNORMAL HIGH (ref 98–111)
Creatinine, Ser: 1.76 mg/dL — ABNORMAL HIGH (ref 0.44–1.00)
GFR, Estimated: 29 mL/min — ABNORMAL LOW (ref >60)
Glucose, Bld: 147 mg/dL — ABNORMAL HIGH (ref 70–99)
Potassium: 4.1 mmol/L (ref 3.5–5.1)
Sodium: 143 mmol/L (ref 135–145)

## 2021-01-01 LAB — BLOOD GAS, VENOUS
Acid-base deficit: 3.2 mmol/L — ABNORMAL HIGH (ref 0.0–2.0)
Bicarbonate: 21.2 mmol/L (ref 20.0–28.0)
Drawn by: 6547
FIO2: 21
O2 Saturation: 81.8 %
Patient temperature: 37.4
pCO2, Ven: 38.4 mmHg — ABNORMAL LOW (ref 44.0–60.0)
pH, Ven: 7.363 (ref 7.250–7.430)
pO2, Ven: 52.2 mmHg — ABNORMAL HIGH (ref 32.0–45.0)

## 2021-01-01 LAB — CBC
HCT: 36.5 % (ref 36.0–46.0)
Hemoglobin: 11.9 g/dL — ABNORMAL LOW (ref 12.0–15.0)
MCH: 30.4 pg (ref 26.0–34.0)
MCHC: 32.6 g/dL (ref 30.0–36.0)
MCV: 93.1 fL (ref 80.0–100.0)
Platelets: 103 10*3/uL — ABNORMAL LOW (ref 150–400)
RBC: 3.92 MIL/uL (ref 3.87–5.11)
RDW: 14.6 % (ref 11.5–15.5)
WBC: 5 10*3/uL (ref 4.0–10.5)
nRBC: 0 % (ref 0.0–0.2)

## 2021-01-01 LAB — GLUCOSE, CAPILLARY
Glucose-Capillary: 153 mg/dL — ABNORMAL HIGH (ref 70–99)
Glucose-Capillary: 211 mg/dL — ABNORMAL HIGH (ref 70–99)

## 2021-01-01 LAB — VITAMIN B12: Vitamin B-12: 450 pg/mL (ref 180–914)

## 2021-01-01 LAB — AMMONIA: Ammonia: 85 umol/L — ABNORMAL HIGH (ref 9–35)

## 2021-01-01 LAB — PROTIME-INR
INR: 1.1 (ref 0.8–1.2)
Prothrombin Time: 14.6 seconds (ref 11.4–15.2)

## 2021-01-01 LAB — CBG MONITORING, ED: Glucose-Capillary: 138 mg/dL — ABNORMAL HIGH (ref 70–99)

## 2021-01-01 LAB — TSH: TSH: 0.942 u[IU]/mL (ref 0.350–4.500)

## 2021-01-01 LAB — BETA-HYDROXYBUTYRIC ACID: Beta-Hydroxybutyric Acid: 0.78 mmol/L — ABNORMAL HIGH (ref 0.05–0.27)

## 2021-01-01 LAB — APTT: aPTT: 30 seconds (ref 24–36)

## 2021-01-01 LAB — FOLATE: Folate: 13.8 ng/mL (ref >5.9)

## 2021-01-01 MED ORDER — SODIUM CHLORIDE 0.9 % IV BOLUS
1000.0000 mL | Freq: Once | INTRAVENOUS | Status: AC
  Administered 2021-01-01: 1000 mL via INTRAVENOUS

## 2021-01-01 NOTE — TOC Transition Note (Signed)
Transition of Care [facility]) - CM/SW Discharge Note   Patient Details  Name: SHALEY LEAVENS MRN: [id number] Date of Birth: 10-Jul-1940  Transition of Care [facility]) CM/SW Contact:  Pollie Friar, RN Phone Number: 01/01/2021, 2:13 PM   Clinical Narrative:    CM is unable to find a home health agency to accept for Home health services. CM met with the patient and called her spouse. They are in agreement with the patient attending outpatient therapy. Orders in Epic and information on the AVS.  Pt has needed DME at home.  Spouse is providing transport home.   Final next level of care: OP Rehab Barriers to Discharge: No Barriers Identified   Patient Goals and CMS Choice     Choice offered to / list presented to : Spouse  Discharge Placement                       Discharge Plan and Services   Discharge Planning Services: CM Consult                                 Social Determinants of Health (SDOH) Interventions     Readmission Risk Interventions No flowsheet data found.

## 2021-01-01 NOTE — Progress Notes (Signed)
Discharge paperwork reviewed with family. All questions answered. No medication changes. Follow up appointment made for in a week and home health consults in. PIVs removed. Patient dressed by family and left by wheelchair.

## 2021-01-01 NOTE — TOC Initial Note (Signed)
Transition of Care [facility]) - Initial/Assessment Note    Patient Details  Name: [name] MRN: [id number] Date of Birth: [birth year]  Transition of Care [facility]) CM/SW Contact:    Verdell Carmine, RN Phone Number: 01/01/2021, 9:49 AM  Clinical Narrative:                  80 year old patient lives at home with husband. Presented with ALOC, decreased oral intake, AKI.  PT and OT consults placed for recommendations. . She has a walker and Shower stool at home.  Discussed DC planning with husband Mr. Paulhus. He is agreeable to [facility] if she needs it, and any DME that she may need to assist.  She has had HH in the past, unknown which agency.  .Husband is interested in  working with agencies that work best with insurance  CM will follow for needs, recommendations,and transitions.    Barriers to Discharge: Continued Medical Work up   Patient Goals and CMS Choice        Expected Discharge Plan and Services     Discharge Planning Services: CM Consult                                          Prior Living Arrangements/Services   Lives with:: Spouse Patient language and need for interpreter reviewed:: Yes        Need for Family Participation in Patient Care: Yes (Comment) Care giver support system in place?: Yes (comment)   Criminal Activity/Legal Involvement Pertinent to Current Situation/Hospitalization: No - Comment as needed  Activities of Daily Living Home Assistive Devices/Equipment: Walker (specify type) ADL Screening (condition at time of admission) Patient's cognitive ability adequate to safely complete daily activities?: Yes Is the patient deaf or have difficulty hearing?: No Does the patient have difficulty seeing, even when wearing glasses/contacts?: Yes Does the patient have difficulty concentrating, remembering, or making decisions?: Yes Patient able to express need for assistance with ADLs?: Yes Does the patient have difficulty dressing or  bathing?: Yes Independently performs ADLs?: Yes (appropriate for developmental age) Communication: Needs assistance Dressing (OT): Needs assistance Grooming: Needs assistance Feeding: Needs assistance Bathing: Needs assistance Toileting: Needs assistance In/Out Bed: Independent Is this a change from baseline?: Pre-admission baseline Walks in Home: Independent Does the patient have difficulty walking or climbing stairs?: Yes Weakness of Legs: Both Weakness of Arms/Hands: Both  Permission Sought/Granted                  Emotional Assessment       Orientation: : Fluctuating Orientation (Suspected and/or reported Sundowners) Alcohol / Substance Use: Not Applicable Psych Involvement: No (comment)  Admission diagnosis:  Confusion [R41.0] Altered mental status [R41.82] AKI (acute kidney injury) ([place]) [N17.9] Patient Active Problem List   Diagnosis Date Noted   Altered mental status 12/31/2020   Acute upper GI bleed 12/23/2020   Hemorrhagic shock ([place])    Bullous lesion 10/08/2020   Hypoglycemia 09/29/2020   Impacted cerumen of left ear 07/15/2020   Hearing loss 05/29/2020   Abnormal LFTs 08/09/2019   Hypervolemia due to congestive heart failure ([place]) 08/02/2019   Neuropathy 07/27/2019   Hyperkalemia 10/18/2018   Port-A-Cath in place 05/23/2018   Iron deficiency anemia due to chronic blood loss 04/19/2018   Acute right hip pain 03/10/2018   Hyperpigmented skin lesion 02/07/2018   Leg weakness, bilateral 12/23/2017  Hematemesis 12/04/2017   Pain localized to upper abdomen 10/05/2017   Lower extremity pain, bilateral 07/15/2017   Pulmonary HTN ([place]) 04/01/2017   Heart murmur 04/01/2017   Overgrown toenails 03/18/2017   Osteopenia after menopause 01/13/2017   Epistaxis 07/30/2016   Venous insufficiency of both lower extremities 04/27/2016   Anemia due to chronic blood loss    Dry skin 02/17/2016   Peripheral vascular disease ([place]) 12/30/2015   Pain in joint of  right shoulder 10/22/2015   PAF (paroxysmal atrial fibrillation) ([place]) 11/65/7903   Lichen planus 83/33/8329   Healthcare maintenance 06/20/2015   Seborrheic keratosis 06/20/2015   Memory loss 01/30/2015   Simple febrile convulsions ([place]) 10/21/2014   Hyperammonemia ([place]) 10/03/2014   CKD (chronic kidney disease), stage III ([name]    Seizures ([place]) 09/2014   Acute kidney injury ([place]) 08/27/2014   Chronic diastolic CHF (congestive heart failure) ([place]) 10/03/2013   Acute on chronic heart failure with preserved ejection fraction (HFpEF) ([place]) 10/03/2013   Type 2 diabetes mellitus without complication ([place]) 19/16/6060   HTN (hypertension), benign 03/02/2012   Fatigue 07/10/2011   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 [address] Phone: [phone] Fax: [phone]     Social Determinants of Health (SDOH) Interventions    Readmission Risk Interventions No flowsheet data found.

## 2021-01-01 NOTE — Plan of Care (Signed)
Pt is oriented x 3. No distress noted. Pts husband updated.    Problem: Education: Goal: Knowledge of General Education information will improve Description: Including pain rating scale, medication(s)/side effects and non-pharmacologic comfort measures Outcome: Progressing   Problem: Health Behavior/Discharge Planning: Goal: Ability to manage health-related needs will improve Outcome: Progressing   Problem: Clinical Measurements: Goal: Ability to maintain clinical measurements within normal limits will improve Outcome: Progressing Goal: Will remain free from infection Outcome: Progressing Goal: Diagnostic test results will improve Outcome: Progressing Goal: Respiratory complications will improve Outcome: Progressing Goal: Cardiovascular complication will be avoided Outcome: Progressing   Problem: Activity: Goal: Risk for activity intolerance will decrease Outcome: Progressing   Problem: Nutrition: Goal: Adequate nutrition will be maintained Outcome: Progressing   Problem: Coping: Goal: Level of anxiety will decrease Outcome: Progressing   Problem: Elimination: Goal: Will not experience complications related to bowel motility Outcome: Progressing Goal: Will not experience complications related to urinary retention Outcome: Progressing   Problem: Pain Managment: Goal: General experience of comfort will improve Outcome: Progressing   Problem: Safety: Goal: Ability to remain free from injury will improve Outcome: Progressing   Problem: Skin Integrity: Goal: Risk for impaired skin integrity will decrease Outcome: Progressing

## 2021-01-01 NOTE — Care Management Obs Status (Signed)
[facility] NOTIFICATION   Patient Details  Name: [name] MRN: [id number] Date of Birth: [birth year]   Medicare Observation Status Notification Given:  Yes  Given via phone, husband given notice as patient is lethargic, consent to sign via phone.  Verdell Carmine, RN 01/01/2021, 9:58 AM

## 2021-01-01 NOTE — Progress Notes (Signed)
HD#0 Subjective:  Overnight Events: NAEO   Patient resting comfortably in bed, says that she is a [place] cold but feeling well. Would like to go home today. Does not have any specific plans for Thanksgiving   Objective:  Vital signs in last 24 hours: Vitals:   01/01/21 0400 01/01/21 0448 01/01/21 0600 01/01/21 0734  BP: (!) 150/50 (!) 168/56  (!) 130/46  Pulse:  60  (!) 57  Resp:  17  14  Temp:  98.1 F (36.7 C)  98.1 F (36.7 C)  TempSrc:  Oral  Oral  SpO2:  100%  100%  Weight:   64.2 kg   Height:   5\' 3"  (1.6 m)    Supplemental O2: Room Air SpO2: 100 %   Physical Exam:  Constitutional: Elderly woman resting comfortably in bed, in no acute distress Cardiovascular: regular rate and rhythm, no m/r/g Pulmonary/Chest: normal work of breathing on room air, lungs clear to auscultation bilaterally Abdominal: soft, non-tender, non-distended MSK: normal bulk and tone Neurological: alert & oriented x 3, at baseline Skin: warm and dry Psych: normal affect  Filed Weights   01/01/21 0600  Weight: 64.2 kg    No intake or output data in the 24 hours ending 01/01/21 1043 Net IO Since Admission: No IO data has been entered for this period [01/01/21 1043]  Pertinent Labs: CBC Latest Ref Rng & Units 01/01/2021 12/31/2020 12/31/2020  WBC 4.0 - 10.5 K/uL 5.0 - 5.4  Hemoglobin 12.0 - 15.0 g/dL 11.9(L) 12.6 13.1  Hematocrit 36.0 - 46.0 % 36.5 37.0 40.4  Platelets 150 - 400 K/uL 103(L) - 100(L)    CMP Latest Ref Rng & Units 01/01/2021 12/31/2020 12/31/2020  Glucose 70 - 99 mg/dL 147(H) 160(H) 164(H)  BUN 8 - 23 mg/dL 21 26(H) 26(H)  Creatinine 0.44 - 1.00 mg/dL 1.76(H) 2.60(H) 2.41(H)  Sodium 135 - 145 mmol/L 143 147(H) 144  Potassium 3.5 - 5.1 mmol/L 4.1 4.7 4.8  Chloride 98 - 111 mmol/L 117(H) 116(H) 114(H)  CO2 22 - 32 mmol/L 20(L) - 21(L)  Calcium 8.9 - 10.3 mg/dL 8.8(L) - 9.1  Total Protein 6.5 - 8.1 g/dL - - 6.0(L)  Total Bilirubin 0.3 - 1.2 mg/dL - - 1.0  Alkaline  Phos 38 - 126 U/L - - 107  AST 15 - 41 U/L - - 28  ALT 0 - 44 U/L - - 18    Imaging: CT Head Wo Contrast  Result Date: 12/31/2020 CLINICAL DATA:  Mental status change, unknown cause. Additional history provided: Altered mental status, suspected GI bleed, lethargy. EXAM: CT HEAD WITHOUT CONTRAST TECHNIQUE: Contiguous axial images were obtained from the base of the skull through the vertex without intravenous contrast. COMPARISON:  Prior head CT examinations 10/03/2014 and earlier. Brain MRI 08/28/2014. FINDINGS: Brain: Moderate cerebral atrophy.  Comparatively mild cerebellar atrophy. Advanced patchy and ill-defined hypoattenuation within the cerebral white matter, nonspecific but compatible with chronic small vessel ischemic disease. There is no acute intracranial hemorrhage. No demarcated cortical infarct. No extra-axial fluid collection. No evidence of an intracranial mass. No midline shift. Vascular: No hyperdense vessel.  Atherosclerotic calcifications. Skull: Normal. Negative for fracture or focal lesion. Sinuses/Orbits: Visualized orbits show no acute finding. Mild mucosal thickening within the bilateral ethmoid and maxillary sinuses at the imaged levels. Trace mucosal thickening within the right sphenoid sinus. IMPRESSION: No evidence of acute intracranial abnormality. Advanced chronic small vessel ischemic changes within the cerebral white matter. Moderate cerebral atrophy. Comparatively mild cerebellar atrophy. Mild  paranasal sinus mucosal thickening at the imaged levels, as described. Electronically Signed   By: Kellie Simmering D.O.   On: 12/31/2020 18:05   DG Chest Port 1 View  Result Date: 12/31/2020 CLINICAL DATA:  Shortness of breath EXAM: PORTABLE CHEST 1 VIEW COMPARISON:  Chest radiograph dated September 29, 2020 FINDINGS: The heart is enlarged. Atherosclerotic calcification of the aortic arch. Prominence of the central pulmonary vessels. No evidence of pulmonary edema or large pleural  effusion. Right-sided MediPort with distal tip in the SVC, unchanged. No acute osseous abnormality. IMPRESSION: 1.  Stable cardiomegaly. 2. Pulmonary vascular congestion without evidence of pulmonary edema or pleural effusion. Electronically Signed   By: Keane Police D.O.   On: 12/31/2020 17:51   EEG adult  Result Date: 12/31/2020 Lora Havens, MD     12/31/2020  8:18 PM Patient Name: [name] MRN: [id number] Epilepsy Attending: Lora Havens Referring Physician/Provider: Dr Su Monks Date: 12/31/2020 Duration: 23.14 mins Patient history: 80yo F with ams. EEG to evaluate for seizure Level of alertness: Awake AEDs during EEG study: None Technical aspects: This EEG study was done with scalp electrodes positioned according to the 10-20 International system of electrode placement. Electrical activity was acquired at a sampling rate of 500Hz  and reviewed with a high frequency filter of 70Hz  and a low frequency filter of 1Hz . EEG data were recorded continuously and digitally stored. Description: No posterior dominant rhythm was seen. EEG showed continuous generalized 3 to 6 Hz theta-delta slowing.Intermittent generalized periodic discharges with triphasic morphology at 1-1.5 Hz were also noted, more prominent when awake/stimulated. Hyperventilation and photic stimulation were not performed.   ABNORMALITY - Periodic discharges with triphasic morphology, generalized ( GPDs) - Continuous slow, generalized IMPRESSION: This study generalized periodic discharges with triphasic morphology at 1-1.5 Hz which can be on the ictal-interictal continuum. However, the frequency and morphology as well as reactivity to stimulation is more commonly due to toxic-metabolic causes like cefepime toxicity, hyperammonemia, uremia. Additionally there is moderate diffuse encephalopathy, nonspecific etiology. No seizures  were seen throughout the recording. Priyanka Barbra Sarks    Assessment/Plan:   Principal Problem:    Altered mental status   Patient Summary: [name] is a 80 y.o. with past medical hx of HTN, Insulin Dependant-DMII, hx of seizures and hx of GI bleed who presented to the ED with acute onset altered mental status and decreased responsiveness and found to have AKI on labs.    Altered Mental Status; resolved Patient's mental status has improved back to baseline this morning. Will check a keppra level to make sure she is taking her medications but as long as she is able to eat and drink and gets clearance from PT/OT she will likely be good to go home today. A seizure may have precipitated patient's AMS and lack of PO intake.  - f/u PT/OT - f/u keppra level   AKI on CKD Patient creatinine is elevated at 2.4 with baseline around 1.6. This appears to be secondary to pre-renal etiology as patient has endorsed inconsistent PO intake. This has improved this morning after IV fluids and now that patient is back to baseline she should be able to have an adequate oral intake. -s/p 2 L fluids -Encourage PO hydration -Avoid nephrotoxic agents -Renally dose medications     HTN Patient has htn that is chronic for this patient and home meds include Norvasc 10 mg qd and Olmesartan 10 mg qd. Patient's husband denies checking BP at home. 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 This is a chronic problem for this patient. Home meds include Trulicity 1.5 mg weekly, insulin lispro 3 units before dinner, and 10 units of 75/25 mix daily. A1c in 11/28/20 was 7.8. -CBG x4 with meals and bedtime -SSI   GERD Hx of gastric ulcer Patient has hx of GERD and is on Protonix 40 mg BID. Patient received IV Protonix 40 mg in the ED. Patient had GI bleeding in the last admission and was found to have a gastric ulcer on endoscopy. No H. Pylori testing ordered. -Continue home dose of Protonix 40 mg BID.  -H. Pylori testing outpatient   Hx  of Seizure Patient had episodes of seizures in 2016 and was placed on Keppra 500 mg BID. Her goddaughter does not report any recent episodes of seizures. 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 She had some thrombocytopenia in 2020 and she started Avastin at that time. It was held in 2021 and her platelet count recovered. I suspect this may be due to her Avastin.   -Continue to monitor  -Outpatient workup   DVT prophx: Lovenox Diet: Heart healthy Bowel: PRN Code:Full  Scarlett Presto, MD Internal Medicine Resident PGY-1 Pager [phone] Please contact the on call pager after 5 pm and on weekends at [phone].

## 2021-01-01 NOTE — Discharge Summary (Signed)
Name: [name] MRN: [id number] DOB: [birth year] 80 y.o. PCP: Mike Craze, DO  Date of Admission: 12/31/2020  4:23 PM Date of Discharge: 01/01/2021 Attending Physician: Aldine Contes, MD  Discharge Diagnosis: 1. AMS; resolved 2. AKI on CKD; resolved 3. HTN 4. DMII 5. GERD 6. Hx of seizures 7. Hx of thrombocytopenia  Discharge Medications: Allergies as of 01/01/2021       Reactions   Aspirin Nausea And Vomiting        Medication List     TAKE these medications    Accu-Chek Guide test strip Generic drug: glucose blood USE AS INSTRUCTED TO CHECK BLOOD SUGAR 3 TIMES DAILY.   Accu-Chek Guide test strip Generic drug: glucose blood USE AS INSTRUCTED TO CHECK BLOOD SUGAR 3 TIMES DAILY.   Accu-Chek Softclix Lancets lancets 1 each by Other route 3 (three) times daily. Use as instructed to check blood sugar 3 times per day dx code E11.65   Lancets Misc 1 each by Other route.   amLODipine 10 MG tablet Commonly known as: NORVASC TAKE 1 TABLET BY MOUTH EVERY MORNING What changed: when to take this   B-D UF III MINI PEN NEEDLES 31G X 5 MM Misc Generic drug: Insulin Pen Needle USE TO INJECT INSULIN THREE TIMES A DAY   diclofenac Sodium 1 % Gel Commonly known as: VOLTAREN Apply 2 g topically 4 (four) times daily.   FreeStyle Libre 2 Sensor Misc 2 Devices by Does not apply route every 14 (fourteen) days.   gabapentin 100 MG capsule Commonly known as: NEURONTIN TAKE 1 CAPSULE BY MOUTH TWICE DAILY   HumaLOG Mix 75/25 KwikPen (75-25) 100 UNIT/ML Kwikpen Generic drug: Insulin Lispro Prot & Lispro INJECT 10 UNITS SUBCUTANEOUSLY ONCE DAILY What changed: See the new instructions.   IMMUNE FORMULA PO Take 1 tablet by mouth daily.   insulin lispro 100 UNIT/ML KwikPen Commonly known as: HumaLOG KwikPen 3 units before dinner What changed:  how much to take how to take this when to take this additional instructions   levETIRAcetam 500 MG  tablet Commonly known as: KEPPRA TAKE 1 TABLET BY MOUTH TWICE DAILY   Medical Compression Stockings Misc Wear as much as possible while awake to reduce swelling   memantine 10 MG tablet Commonly known as: NAMENDA TAKE 1 TABLET BY MOUTH TWICE DAILY   Multi For Her 50+ Tabs Take 1 tablet by mouth daily with breakfast.   olmesartan 20 MG tablet Commonly known as: BENICAR Take 10 mg by mouth daily.   pantoprazole 40 MG tablet Commonly known as: PROTONIX Take 1 tablet (40 mg total) by mouth 2 (two) times daily.   potassium chloride SA 20 MEQ tablet Commonly known as: KLOR-CON Take 20 mEq by mouth 2 (two) times daily.   triamcinolone ointment 0.5 % Commonly known as: KENALOG APPLY 1 APPLICATION TOPICALLY TO RASH TWICE DAILY FOR ITCHING What changed: See the new instructions.   Trulicity 1.5 TK/2.4OX Sopn Generic drug: Dulaglutide INJECT 1.5MG  SUBCUTANEOUSLY EVERY WEEK ON THURSDAY What changed: See the new instructions.        Disposition and follow-up:   Ms.Anjela L Larke was discharged from [facility] in Stable condition.  At the hospital follow up visit please address:  1.  AMS/History of seizures- Unclear whether patient had a seizure that lead to AMS, ordered a keppra level to double check that patient is taking her keppra as directed. This was still pending upon discharge so follow up level. Double check that  patient's mental status has remained at baseline since discharge  2. GI bleed/hemorrhagic shock: Admitted to the ICU during recent admission for hemorrhagic shock. Improved after blood transfusion. Please reevaluate for continual resolution of her symptoms and check a CBC. Patient was started on a PPI and advised to avoid NSAIDs.   3. AKI on CKD 3: Resolved after blood transfusion. Needs a BMP at hospital follow-up.  2.  Labs / imaging needed at time of follow-up: cbc, bmp  3.  Pending labs/ test needing follow-up: keppra level  Follow-up  Appointments: Appt with Dr. Lajean Manes on 01/08/21 at 9:30 am  Hospital Course by problem list: 1.  AMS/ Hx of seizures Patient was brought to the hospital for decreasing level of consciousness/arousal after husband said she was non-interactive the morning of 11/22 and into that afternoon. She was also dehydrated likely secondary to the AMS and lack of good PO intake. It was possible that she had a seizure overnight and was post-ictal though she had no tongue biting or loss of bowel or bladder control. CT head was obtained which showed no acute intracranial abnormalities and patient had no focal deficits making stroke less likely. EEG was obtained at the hospital which showed generalized periodic discharges with triphasic morphology at 1-1.5 Hz which can be on the ictal-interictal continuum, however this could also be related to toxic metabolic causes. EEG also showed moderate diffuse encephalopathy. Patient was given fluids and mental status continued to improve. By the next morning the patient was alert, oriented, eating and drinking, and able to work with PT/OT and deemed stable for discharge. PT/OT evaluated patient and recommended home health PT/OT.   2. AKI on CKD 3 Patient was brought into the hospital with AMS as above leading to decreased PO intake. Creatinine was increased from a baseline of 1.6 to about 2.4. UA was largely unremarkable except for ketones which suggest patient was not eating or drinking enough. After two liters of fluid patient's creatinine improved back to 1.7 and she was able to take in adequate PO as above and was ok for discharge.  3. HTN Patient's home norvasc 10mg  was continued but olmesartan was initially held 2/2 AKI. Restarted home medications upon discharge  4. DMII Patient was put on SSI. Will continue home insulin and trulicity upon discharge.  5. GERD Continued patient on protonix 40 BID. Will need H pylori testing as an outpatient.   7. Hx of  thrombocytopenia Monitored with daily CBC, this was a chronic problem likely related to her Avastin.   Discharge Exam:   BP (!) 130/46 (BP Location: Right Arm)   Pulse (!) 57   Temp 98.1 F (36.7 C) (Oral)   Resp 14   Ht 5\' 3"  (1.6 m)   Wt 64.2 kg   LMP  (LMP Unknown)   SpO2 100%   BMI 25.07 kg/m  Discharge exam:  Constitutional: Elderly woman resting comfortably in bed, in no acute distress Cardiovascular: regular rate and rhythm, no m/r/g Pulmonary/Chest: normal work of breathing on room air, lungs clear to auscultation bilaterally Abdominal: soft, non-tender, non-distended MSK: normal bulk and tone Neurological: alert & oriented x 3, at baseline Skin: warm and dry Psych: normal affect  Pertinent Labs, Studies, and Procedures:  CT Head Wo Contrast  Result Date: 12/31/2020 CLINICAL DATA:  Mental status change, unknown cause. Additional history provided: Altered mental status, suspected GI bleed, lethargy. EXAM: CT HEAD WITHOUT CONTRAST TECHNIQUE: Contiguous axial images were obtained from the base of the skull  through the vertex without intravenous contrast. COMPARISON:  Prior head CT examinations 10/03/2014 and earlier. Brain MRI 08/28/2014. FINDINGS: Brain: Moderate cerebral atrophy.  Comparatively mild cerebellar atrophy. Advanced patchy and ill-defined hypoattenuation within the cerebral white matter, nonspecific but compatible with chronic small vessel ischemic disease. There is no acute intracranial hemorrhage. No demarcated cortical infarct. No extra-axial fluid collection. No evidence of an intracranial mass. No midline shift. Vascular: No hyperdense vessel.  Atherosclerotic calcifications. Skull: Normal. Negative for fracture or focal lesion. Sinuses/Orbits: Visualized orbits show no acute finding. Mild mucosal thickening within the bilateral ethmoid and maxillary sinuses at the imaged levels. Trace mucosal thickening within the right sphenoid sinus. IMPRESSION: No evidence of  acute intracranial abnormality. Advanced chronic small vessel ischemic changes within the cerebral white matter. Moderate cerebral atrophy. Comparatively mild cerebellar atrophy. Mild paranasal sinus mucosal thickening at the imaged levels, as described. Electronically Signed   By: Kellie Simmering D.O.   On: 12/31/2020 18:05   DG Chest Port 1 View  Result Date: 12/31/2020 CLINICAL DATA:  Shortness of breath EXAM: PORTABLE CHEST 1 VIEW COMPARISON:  Chest radiograph dated September 29, 2020 FINDINGS: The heart is enlarged. Atherosclerotic calcification of the aortic arch. Prominence of the central pulmonary vessels. No evidence of pulmonary edema or large pleural effusion. Right-sided MediPort with distal tip in the SVC, unchanged. No acute osseous abnormality. IMPRESSION: 1.  Stable cardiomegaly. 2. Pulmonary vascular congestion without evidence of pulmonary edema or pleural effusion. Electronically Signed   By: Keane Police D.O.   On: 12/31/2020 17:51   EEG adult  Result Date: 12/31/2020 Lora Havens, MD     12/31/2020  8:18 PM Patient Name: [name] MRN: [id number] Epilepsy Attending: Lora Havens Referring Physician/Provider: Dr Su Monks Date: 12/31/2020 Duration: 23.14 mins Patient history: 80yo F with ams. EEG to evaluate for seizure Level of alertness: Awake AEDs during EEG study: None Technical aspects: This EEG study was done with scalp electrodes positioned according to the 10-20 International system of electrode placement. Electrical activity was acquired at a sampling rate of 500Hz  and reviewed with a high frequency filter of 70Hz  and a low frequency filter of 1Hz . EEG data were recorded continuously and digitally stored. Description: No posterior dominant rhythm was seen. EEG showed continuous generalized 3 to 6 Hz theta-delta slowing.Intermittent generalized periodic discharges with triphasic morphology at 1-1.5 Hz were also noted, more prominent when awake/stimulated.  Hyperventilation and photic stimulation were not performed.   ABNORMALITY - Periodic discharges with triphasic morphology, generalized ( GPDs) - Continuous slow, generalized IMPRESSION: This study generalized periodic discharges with triphasic morphology at 1-1.5 Hz which can be on the ictal-interictal continuum. However, the frequency and morphology as well as reactivity to stimulation is more commonly due to toxic-metabolic causes like cefepime toxicity, hyperammonemia, uremia. Additionally there is moderate diffuse encephalopathy, nonspecific etiology. No seizures  were seen throughout the recording. Priyanka O Yadav     CBC Latest Ref Rng & Units 01/01/2021 12/31/2020 12/31/2020  WBC 4.0 - 10.5 K/uL 5.0 - 5.4  Hemoglobin 12.0 - 15.0 g/dL 11.9(L) 12.6 13.1  Hematocrit 36.0 - 46.0 % 36.5 37.0 40.4  Platelets 150 - 400 K/uL 103(L) - 100(L)   BMP Latest Ref Rng & Units 01/01/2021 12/31/2020 12/31/2020  Glucose 70 - 99 mg/dL 147(H) 160(H) 164(H)  BUN 8 - 23 mg/dL 21 26(H) 26(H)  Creatinine 0.44 - 1.00 mg/dL 1.76(H) 2.60(H) 2.41(H)  BUN/Creat Ratio 12 - 28 - - -  Sodium 135 - 145  mmol/L 143 147(H) 144  Potassium 3.5 - 5.1 mmol/L 4.1 4.7 4.8  Chloride 98 - 111 mmol/L 117(H) 116(H) 114(H)  CO2 22 - 32 mmol/L 20(L) - 21(L)  Calcium 8.9 - 10.3 mg/dL 8.8(L) - 9.1    Discharge Instructions:  Mrs. Ante, It was a pleasure taking care of you at [facility] were admitted for altered mental status and treated for an acute kidney injury. We are discharging you home now that you are doing better. Please follow the following instructions.  1) Follow up with the internal medicine clinic next week, 01/08/21 at 9:30 am 2) Take your medications as prescribed, especially your keppra 3) Make sure to stay hydrated by drinking at least 2 liters of water daily  Take care,  Dr. Linwood Dibbles, MD, MPH  Signed: Scarlett Presto, MD 01/01/2021, 10:57 AM   [phone]

## 2021-01-01 NOTE — Evaluation (Signed)
Occupational Therapy Evaluation Patient Details Name: [name] MRN: [id number] DOB: [birth year] Today's Date: 01/01/2021   History of Present Illness 80 y.o. F admitted on 11/22 due to AMS and increased lethargy. PMH significant for Gastric Ulcer (11/17), GI Bleeds, Anemia, HTN, DM2, Seizures.   Clinical Impression   Pt admitted for concerns listed above. PTA pt reported that she was independent with all ADL's and functional mobility. Pt using a RW for ambulation this session, requiring min guard for safety and min verbal cuing for safety awareness. Overall pt appears at baseline with cognition, some decreased safety awareness and short term memory deficits. Recommending HHOT to maximize strength, balance, and ADL performance at home, with an emphasis on safety. OT will follow acutely.      Recommendations for follow up therapy are one component of a multi-disciplinary discharge planning process, led by the attending physician.  Recommendations may be updated based on patient status, additional functional criteria and insurance authorization.   Follow Up Recommendations  Home health OT    Assistance Recommended at Discharge Set up Supervision/Assistance  Functional Status Assessment  Patient has had a recent decline in their functional status and demonstrates the ability to make significant improvements in function in a reasonable and predictable amount of time.  Equipment Recommendations  None recommended by OT    Recommendations for Other Services       Precautions / Restrictions Precautions Precautions: Fall Restrictions Weight Bearing Restrictions: No      Mobility Bed Mobility Overal bed mobility: Needs Assistance Bed Mobility: Supine to Sit;Sit to Supine     Supine to sit: Supervision Sit to supine: Min guard   General bed mobility comments: Pt with increased effort to bring BLE into bed.    Transfers Overall transfer level: Needs assistance Equipment used:  Rolling walker (2 wheels) Transfers: Sit to/from Stand Sit to Stand: Min guard           General transfer comment: Min Guard for safety      Balance Overall balance assessment: Mild deficits observed, not formally tested                                         ADL either performed or assessed with clinical judgement   ADL Overall ADL's : Needs assistance/impaired Eating/Feeding: Set up;Sitting   Grooming: Min guard;Standing   Upper Body Bathing: Supervision/ safety;Sitting   Lower Body Bathing: Min guard;Sitting/lateral leans;Sit to/from stand   Upper Body Dressing : Set up;Sitting   Lower Body Dressing: Min guard;Sitting/lateral leans;Sit to/from stand   Toilet Transfer: Min guard;Ambulation   Toileting- Clothing Manipulation and Hygiene: Min guard;Sitting/lateral lean;Sit to/from stand       Functional mobility during ADLs: Min guard;Rolling walker (2 wheels) General ADL Comments: Pt a [place] off balance, needing min guard for safety with mobility.     Vision Baseline Vision/History: 1 Wears glasses Ability to See in Adequate Light: 1 Impaired Patient Visual Report: No change from baseline Vision Assessment?: No apparent visual deficits     Perception     Praxis      Pertinent Vitals/Pain Pain Assessment: No/denies pain     Hand Dominance Right   Extremity/Trunk Assessment Upper Extremity Assessment Upper Extremity Assessment: Generalized weakness   Lower Extremity Assessment Lower Extremity Assessment: Defer to PT evaluation   Cervical / Trunk Assessment Cervical / Trunk Assessment: Kyphotic   Communication Communication  Communication: No difficulties   Cognition Arousal/Alertness: Awake/alert Behavior During Therapy: WFL for tasks assessed/performed Overall Cognitive Status: Impaired/Different from baseline Area of Impairment: Orientation;Memory;Safety/judgement                 Orientation Level: Disoriented  to;Situation   Memory: Decreased short-term memory   Safety/Judgement: Decreased awareness of safety     General Comments: Pt not fully aware of why she is in the hospital, thinks it is due to a GI bleed, which she was hospitalized for last week. Needs min cuing for safety.     General Comments  VSS on RA    Exercises     Shoulder Instructions      Home Living Family/patient expects to be discharged to:: Private residence Living Arrangements: Spouse/significant other Available Help at Discharge: Family Type of Home: House Home Access: Ramped entrance     Home Layout: One level     Bathroom Shower/Tub: Teacher, early years/pre: Standard Bathroom Accessibility: Yes How Accessible: Accessible via walker Home Equipment: Allisonia (2 wheels);Wheelchair - manual;Shower seat          Prior Functioning/Environment Prior Level of Function : Independent/Modified Independent             Mobility Comments: Pt reports using a RW intermittently at home and all the time in the community. ADLs Comments: Pt completes all ADL's with no difficulties and Niece assists with Cooking and cleaning.        OT Problem List: Decreased strength;Decreased activity tolerance;Impaired balance (sitting and/or standing);Decreased coordination;Decreased safety awareness      OT Treatment/Interventions: Self-care/ADL training;Therapeutic exercise;Energy conservation;DME and/or AE instruction;Therapeutic activities;Patient/family education;Balance training    OT Goals(Current goals can be found in the care plan section) Acute Rehab OT Goals Patient Stated Goal: To go home today OT Goal Formulation: With patient Time For Goal Achievement: 01/15/21 Potential to Achieve Goals: Good ADL Goals Pt Will Perform Grooming: with modified independence;standing Pt Will Perform Lower Body Bathing: with modified independence;sitting/lateral leans;sit to/from stand Pt Will Perform Lower  Body Dressing: with modified independence;sitting/lateral leans;sit to/from stand Pt Will Transfer to Toilet: with modified independence;ambulating Pt Will Perform Toileting - Clothing Manipulation and hygiene: with modified independence;sitting/lateral leans;sit to/from stand Additional ADL Goal #1: Pt will use RW 100% of the time with all ambulation.  OT Frequency: Min 2X/week   Barriers to D/C:            Co-evaluation              AM-PAC OT "6 Clicks" Daily Activity     Outcome Measure Help from another person eating meals?: A Beneke Help from another person taking care of personal grooming?: A Perret Help from another person toileting, which includes using toliet, bedpan, or urinal?: A Petroni Help from another person bathing (including washing, rinsing, drying)?: A Locatelli Help from another person to put on and taking off regular upper body clothing?: A Schroader Help from another person to put on and taking off regular lower body clothing?: A [place] 6 Click Score: 18   End of Session Equipment Utilized During Treatment: Rolling walker (2 wheels) Nurse Communication: Mobility status  Activity Tolerance: Patient tolerated treatment well Patient left: in bed;with call bell/phone within reach;with bed alarm set  OT Visit Diagnosis: Unsteadiness on feet (R26.81);Other abnormalities of gait and mobility (R26.89);Muscle weakness (generalized) (M62.81)                Time: 0272-5366 OT Time Calculation (min): 19  min Charges:  OT General Charges $OT Visit: 1 Visit OT Evaluation $OT Eval Moderate Complexity: 1 Mod  Mckennah Kretchmer H., OTR/L Acute Rehabilitation  Millissa Deese Elane Yolanda Bonine 01/01/2021, 10:46 AM

## 2021-01-01 NOTE — Discharge Instructions (Addendum)
[name], It was a pleasure taking care of you at [facility] were admitted for altered mental status and treated for an acute kidney injury. We are discharging you home now that you are doing better. Please follow the following instructions.  1) Follow up with the internal medicine clinic next week, 01/08/21 at 9:30 am 2) Take your medications as prescribed, especially your keppra 3) Make sure to stay hydrated by drinking at least 2 liters of water daily  Take care,  Dr. Linwood Dibbles, MD, MPH     Medicare Outpatient Observation Notice   Patient name:  [name] Patient number:  [id number]                                                                                                                                                                       You're a hospital outpatient receiving observation services. You are not an inpatient because:    AKI   You require hospital care for evaluation and/or treatment.  It is expected you will need hospital care for less than a total of two days.                                                                                                                                                                         Being an outpatient may affect what you pay in a hospital:   When you're a hospital outpatient, your observation stay is covered under Medicare Part B.   For Part B services, you generally pay:   A copayment for each outpatient hospital service you get. Part B copayments may vary by type of service.   20% of the Medicare-approved amount for most doctor services, after the Part B deductible.   Observation services may affect coverage and payment of your care after you leave the hospital:     If you need skilled nursing facility (SNF) care after you leave the hospital, Medicare Part A will  only cover SNF care if you've had a 3-day minimum, medically necessary, inpatient hospital stay for a related illness  or injury. An inpatient hospital stay begins the day the hospital admits you as an inpatient based on a doctor's order and doesn't include the day you're discharged.   If you have Medicaid, a Medicare Advantage plan or other health plan, Medicaid or the plan may have different rules for SNF coverage after you leave the hospital. Check with Medicaid or your plan.   NOTE: Medicare Part A generally doesn't cover outpatient hospital services, like an observation stay. However, Part A will generally cover medically necessary inpatient services if the hospital admits you as an inpatient based on a doctor's order. In most cases, you'll pay a one-time deductible for all of your inpatient hospital services for the first 60 days you're in a hospital.                                                                                                                                                                      If you have any questions about your observation services, ask the hospital staff member giving you this notice or the doctor providing your hospital care. You can also ask to speak with someone from the hospital's utilization or discharge planning department.   You can also call 1-800-MEDICARE (1-386 017 1785).  TTY users should call [phone] [phone].   Form CMS 08144-YJEH   Expiration 02/08/2021 [employer] APPROVAL [id number]          Your costs for medications:     Generally, prescription and over-the-counter drugs, including "self-administered drugs," you get in a hospital outpatient setting (like an emergency department) aren't covered by Part B. "Self- administered drugs" are drugs you'd normally take on your own. For safety reasons, many hospitals don't allow you to take medications brought from home. If you have a Medicare prescription drug plan (Part D), your plan may help you pay for these drugs. You'll likely need to pay out-of- pocket for these drugs and submit a claim to your drug plan for a  refund. Contact your drug plan for more information.  If you're enrolled in a Medicare Advantage plan (like an HMO or PPO) or other Medicare health plan (Part C), your costs and coverage may be different. Check with your plan to find out about coverage for outpatient observation services.    If you're a Qualified Medicare Beneficiary through your state Medicaid program, you can't be billed for Part A or Part B deductibles, coinsurance, and copayments.                                                                                                                                                                      Additional Information (Optional):                                                                                                                                                                                Please sign below to show you received and understand this notice.                                         Date: 01/01/21 / Time:9:57 AM   CMS does not discriminate in its programs and activities. To request this publication in alternative format, please call: 1-800-MEDICARE or email:[email or web address].   Form CMS [id number]   Expiration 02/08/2021 [employer] APPROVAL [id number]      Patient   Add No image attached Trace Slow Corrupt Edit Data Change Template Print On

## 2021-01-01 NOTE — Evaluation (Signed)
Physical Therapy Evaluation Patient Details Name: [name] MRN: [id number] DOB: Aug 20, 1940 Today's Date: 01/01/2021  History of Present Illness  80 y.o. F admitted on 11/22 due to AMS and increased lethargy. CT negative. Found to have AKI. PMH significant for Gastric Ulcer (11/17), GI Bleeds, Anemia, HTN, DM2, Seizures.  Clinical Impression  Pt admitted secondary to problem above with deficits below. Presenting with mild confusion throughout. Min guard A for mobility tasks this session using RW. Recommending HHPT to address current deficits. Pt very eager to d/c home. Will continue to follow acutely.        Recommendations for follow up therapy are one component of a multi-disciplinary discharge planning process, led by the attending physician.  Recommendations may be updated based on patient status, additional functional criteria and insurance authorization.  Follow Up Recommendations Home health PT    Assistance Recommended at Discharge Frequent or constant Supervision/Assistance  Functional Status Assessment Patient has had a recent decline in their functional status and demonstrates the ability to make significant improvements in function in a reasonable and predictable amount of time.  Equipment Recommendations  None recommended by PT    Recommendations for Other Services       Precautions / Restrictions Precautions Precautions: Fall Restrictions Weight Bearing Restrictions: No      Mobility  Bed Mobility Overal bed mobility: Needs Assistance Bed Mobility: Supine to Sit;Sit to Supine     Supine to sit: Supervision;HOB elevated Sit to supine: Min assist   General bed mobility comments: Pt with increased effort to bring BLE into bed.    Transfers Overall transfer level: Needs assistance Equipment used: Rolling walker (2 wheels) Transfers: Sit to/from Stand Sit to Stand: Min guard           General transfer comment: Min Guard for safety     Ambulation/Gait Ambulation/Gait assistance: Counsellor (Feet): 40 Feet Assistive device: Rolling walker (2 wheels) Gait Pattern/deviations: Step-through pattern;Decreased stride length Gait velocity: Decreased     General Gait Details: Pt wanting to stay in room because she was cold, so walked laps within room. Min guard for safety.  Stairs            Wheelchair Mobility    Modified Rankin (Stroke Patients Only)       Balance Overall balance assessment: Mild deficits observed, not formally tested                                           Pertinent Vitals/Pain Pain Assessment: No/denies pain    Home Living Family/patient expects to be discharged to:: Private residence Living Arrangements: Spouse/significant other Available Help at Discharge: Family Type of Home: House Home Access: [place]: [place]: Conservation officer, nature (2 wheels);Air cabin crew (4 wheels)      Prior Function Prior Level of Function : Independent/Modified Independent             Mobility Comments: Pt reports using a RW intermittently at home and all the time in the community. ADLs Comments: Pt completes all ADL's with no difficulties and Niece assists with Cooking and cleaning.     Hand Dominance   Dominant Hand: Right    Extremity/Trunk Assessment   Upper Extremity Assessment Upper Extremity Assessment: Defer to OT evaluation    Lower Extremity Assessment Lower Extremity Assessment: Generalized weakness  Cervical / Trunk Assessment Cervical / Trunk Assessment: Kyphotic  Communication   Communication: No difficulties  Cognition Arousal/Alertness: Awake/alert Behavior During Therapy: WFL for tasks assessed/performed Overall Cognitive Status: Impaired/Different from baseline Area of Impairment: Memory;Safety/judgement;Problem solving                 Orientation Level: Disoriented  to;Situation   Memory: Decreased short-term memory   Safety/Judgement: Decreased awareness of safety   Problem Solving: Slow processing;Requires verbal cues General Comments: Pt not fully aware of why she is in the hospital, thinks it is due to a GI bleed, which she was hospitalized for last week. Needs min cuing for safety.        General Comments General comments (skin integrity, edema, etc.): VSS on RA    Exercises     Assessment/Plan    PT Assessment Patient needs continued PT services  PT Problem List Decreased strength;Decreased activity tolerance;Decreased balance;Decreased mobility;Decreased cognition;Decreased knowledge of use of DME;Decreased safety awareness;Decreased knowledge of precautions       PT Treatment Interventions DME instruction;Gait training;Stair training;Functional mobility training;Therapeutic activities;Therapeutic exercise;Balance training;Patient/family education    PT Goals (Current goals can be found in the Care Plan section)  Acute Rehab PT Goals Patient Stated Goal: to go home PT Goal Formulation: With patient Time For Goal Achievement: 01/15/21 Potential to Achieve Goals: Good    Frequency Min 3X/week   Barriers to discharge        Co-evaluation               AM-PAC PT "6 Clicks" Mobility  Outcome Measure Help needed turning from your back to your side while in a flat bed without using bedrails?: None Help needed moving from lying on your back to sitting on the side of a flat bed without using bedrails?: A Salmons Help needed moving to and from a bed to a chair (including a wheelchair)?: A Loken Help needed standing up from a chair using your arms (e.g., wheelchair or bedside chair)?: A [place] Help needed to walk in hospital room?: A Carda Help needed climbing 3-5 steps with a railing? : A Lohr 6 Click Score: 19    End of Session Equipment Utilized During Treatment: Gait belt Activity Tolerance: Patient tolerated  treatment well Patient left: in bed;with call bell/phone within reach;with bed alarm set;with family/visitor present Nurse Communication: Mobility status PT Visit Diagnosis: Other abnormalities of gait and mobility (R26.89);Muscle weakness (generalized) (M62.81)    Time: 4403-4742 PT Time Calculation (min) (ACUTE ONLY): 16 min   Charges:   PT Evaluation $PT Eval Low Complexity: 1 Low          Lou Miner, DPT  Acute Rehabilitation Services  Pager: [phone] Office: [phone]   Rudean Hitt 01/01/2021, 12:32 PM

## 2021-01-03 ENCOUNTER — Other Ambulatory Visit: Payer: Self-pay | Admitting: Internal Medicine

## 2021-01-03 LAB — LEVETIRACETAM LEVEL: Levetiracetam Lvl: 49.7 ug/mL — ABNORMAL HIGH (ref 10.0–40.0)

## 2021-01-08 ENCOUNTER — Ambulatory Visit: Payer: Medicare Other

## 2021-01-08 ENCOUNTER — Encounter: Payer: Medicare Other | Admitting: Internal Medicine

## 2021-01-08 ENCOUNTER — Other Ambulatory Visit: Payer: Medicare Other

## 2021-01-09 ENCOUNTER — Encounter: Payer: Medicare Other | Admitting: Internal Medicine

## 2021-01-09 ENCOUNTER — Encounter (HOSPITAL_COMMUNITY): Payer: Self-pay

## 2021-01-09 ENCOUNTER — Observation Stay (HOSPITAL_COMMUNITY)
Admission: EM | Admit: 2021-01-09 | Discharge: 2021-01-15 | Disposition: A | Discharge status: Home / Self Care | Payer: Medicare Other | Attending: Internal Medicine | Admitting: Internal Medicine

## 2021-01-09 ENCOUNTER — Emergency Department (HOSPITAL_COMMUNITY): Payer: Medicare Other

## 2021-01-09 DIAGNOSIS — Z79899 Other long term (current) drug therapy: Secondary | ICD-10-CM | POA: Diagnosis not present

## 2021-01-09 DIAGNOSIS — Z87891 Personal history of nicotine dependence: Secondary | ICD-10-CM | POA: Diagnosis not present

## 2021-01-09 DIAGNOSIS — E1122 Type 2 diabetes mellitus with diabetic chronic kidney disease: Secondary | ICD-10-CM | POA: Insufficient documentation

## 2021-01-09 DIAGNOSIS — N183 Chronic kidney disease, stage 3 unspecified: Secondary | ICD-10-CM | POA: Diagnosis not present

## 2021-01-09 DIAGNOSIS — G9341 Metabolic encephalopathy: Principal | ICD-10-CM

## 2021-01-09 DIAGNOSIS — R569 Unspecified convulsions: Secondary | ICD-10-CM

## 2021-01-09 DIAGNOSIS — Z7985 Long-term (current) use of injectable non-insulin antidiabetic drugs: Secondary | ICD-10-CM | POA: Diagnosis not present

## 2021-01-09 DIAGNOSIS — T68XXXA Hypothermia, initial encounter: Secondary | ICD-10-CM

## 2021-01-09 DIAGNOSIS — F0390 Unspecified dementia without behavioral disturbance: Secondary | ICD-10-CM | POA: Insufficient documentation

## 2021-01-09 DIAGNOSIS — E119 Type 2 diabetes mellitus without complications: Secondary | ICD-10-CM

## 2021-01-09 DIAGNOSIS — E860 Dehydration: Secondary | ICD-10-CM

## 2021-01-09 DIAGNOSIS — I130 Hypertensive heart and chronic kidney disease with heart failure and stage 1 through stage 4 chronic kidney disease, or unspecified chronic kidney disease: Secondary | ICD-10-CM | POA: Diagnosis not present

## 2021-01-09 DIAGNOSIS — N179 Acute kidney failure, unspecified: Secondary | ICD-10-CM

## 2021-01-09 DIAGNOSIS — G629 Polyneuropathy, unspecified: Secondary | ICD-10-CM

## 2021-01-09 DIAGNOSIS — Z20822 Contact with and (suspected) exposure to covid-19: Secondary | ICD-10-CM | POA: Diagnosis not present

## 2021-01-09 DIAGNOSIS — Z794 Long term (current) use of insulin: Secondary | ICD-10-CM | POA: Diagnosis not present

## 2021-01-09 DIAGNOSIS — I10 Essential (primary) hypertension: Secondary | ICD-10-CM | POA: Diagnosis present

## 2021-01-09 DIAGNOSIS — R4182 Altered mental status, unspecified: Secondary | ICD-10-CM

## 2021-01-09 DIAGNOSIS — I5022 Chronic systolic (congestive) heart failure: Secondary | ICD-10-CM | POA: Diagnosis not present

## 2021-01-09 DIAGNOSIS — R627 Adult failure to thrive: Secondary | ICD-10-CM | POA: Diagnosis present

## 2021-01-09 DIAGNOSIS — K31819 Angiodysplasia of stomach and duodenum without bleeding: Secondary | ICD-10-CM

## 2021-01-09 LAB — CBC WITH DIFFERENTIAL/PLATELET
Abs Immature Granulocytes: 0.01 10*3/uL (ref 0.00–0.07)
Basophils Absolute: 0 10*3/uL (ref 0.0–0.1)
Basophils Relative: 1 %
Eosinophils Absolute: 0.5 10*3/uL (ref 0.0–0.5)
Eosinophils Relative: 8 %
HCT: 43.3 % (ref 36.0–46.0)
Hemoglobin: 14.3 g/dL (ref 12.0–15.0)
Immature Granulocytes: 0 %
Lymphocytes Relative: 25 %
Lymphs Abs: 1.5 10*3/uL (ref 0.7–4.0)
MCH: 30.4 pg (ref 26.0–34.0)
MCHC: 33 g/dL (ref 30.0–36.0)
MCV: 92.1 fL (ref 80.0–100.0)
Monocytes Absolute: 0.6 10*3/uL (ref 0.1–1.0)
Monocytes Relative: 10 %
Neutro Abs: 3.4 10*3/uL (ref 1.7–7.7)
Neutrophils Relative %: 56 %
Platelets: 158 10*3/uL (ref 150–400)
RBC: 4.7 MIL/uL (ref 3.87–5.11)
RDW: 13.9 % (ref 11.5–15.5)
WBC: 6 10*3/uL (ref 4.0–10.5)
nRBC: 0 % (ref 0.0–0.2)

## 2021-01-09 LAB — URINALYSIS, ROUTINE W REFLEX MICROSCOPIC
Bilirubin Urine: NEGATIVE
Glucose, UA: NEGATIVE mg/dL
Hgb urine dipstick: NEGATIVE
Ketones, ur: NEGATIVE mg/dL
Leukocytes,Ua: NEGATIVE
Nitrite: NEGATIVE
Protein, ur: NEGATIVE mg/dL
Specific Gravity, Urine: 1.006 (ref 1.005–1.030)
pH: 5 (ref 5.0–8.0)

## 2021-01-09 LAB — COMPREHENSIVE METABOLIC PANEL
ALT: 17 U/L (ref 0–44)
AST: 24 U/L (ref 15–41)
Albumin: 3.8 g/dL (ref 3.5–5.0)
Alkaline Phosphatase: 128 U/L — ABNORMAL HIGH (ref 38–126)
Anion gap: 11 (ref 5–15)
BUN: 24 mg/dL — ABNORMAL HIGH (ref 8–23)
CO2: 19 mmol/L — ABNORMAL LOW (ref 22–32)
Calcium: 9.5 mg/dL (ref 8.9–10.3)
Chloride: 111 mmol/L (ref 98–111)
Creatinine, Ser: 2 mg/dL — ABNORMAL HIGH (ref 0.44–1.00)
GFR, Estimated: 25 mL/min — ABNORMAL LOW (ref >60)
Glucose, Bld: 192 mg/dL — ABNORMAL HIGH (ref 70–99)
Potassium: 4.8 mmol/L (ref 3.5–5.1)
Sodium: 141 mmol/L (ref 135–145)
Total Bilirubin: 1 mg/dL (ref 0.3–1.2)
Total Protein: 7.3 g/dL (ref 6.5–8.1)

## 2021-01-09 LAB — GLUCOSE, CAPILLARY: Glucose-Capillary: 176 mg/dL — ABNORMAL HIGH (ref 70–99)

## 2021-01-09 LAB — LACTIC ACID, PLASMA
Lactic Acid, Venous: 2.5 mmol/L (ref 0.5–1.9)
Lactic Acid, Venous: 1.7 mmol/L (ref 0.5–1.9)

## 2021-01-09 LAB — RESP PANEL BY RT-PCR (FLU A&B, COVID) ARPGX2
Influenza A by PCR: NEGATIVE
Influenza B by PCR: NEGATIVE
SARS Coronavirus 2 by RT PCR: NEGATIVE

## 2021-01-09 LAB — CBG MONITORING, ED: Glucose-Capillary: 185 mg/dL — ABNORMAL HIGH (ref 70–99)

## 2021-01-09 MED ORDER — MULTI FOR HER 50+ PO TABS: 1.0000 | ORAL_TABLET | Freq: Every day | ORAL | Status: DC

## 2021-01-09 MED ORDER — GABAPENTIN 100 MG PO CAPS
100.0000 mg | ORAL_CAPSULE | Freq: Two times a day (BID) | ORAL | Status: DC
  Administered 2021-01-09 – 2021-01-15 (×12): 100 mg via ORAL
  Filled 2021-01-09 (×12): qty 1

## 2021-01-09 MED ORDER — INSULIN ASPART 100 UNIT/ML IJ SOLN
0.0000 [IU] | Freq: Three times a day (TID) | INTRAMUSCULAR | Status: DC
Start: 2021-01-09 — End: 2021-01-15
  Administered 2021-01-10 (×2): 5 [IU] via SUBCUTANEOUS
  Administered 2021-01-10: 1 [IU] via SUBCUTANEOUS
  Administered 2021-01-11: 2 [IU] via SUBCUTANEOUS
  Administered 2021-01-11: 5 [IU] via SUBCUTANEOUS
  Administered 2021-01-11: 2 [IU] via SUBCUTANEOUS
  Administered 2021-01-12: 08:00:00 1 [IU] via SUBCUTANEOUS
  Administered 2021-01-12: 17:00:00 3 [IU] via SUBCUTANEOUS
  Administered 2021-01-12: 12:00:00 2 [IU] via SUBCUTANEOUS
  Administered 2021-01-13: 5 [IU] via SUBCUTANEOUS
  Administered 2021-01-13: 2 [IU] via SUBCUTANEOUS
  Administered 2021-01-13: 3 [IU] via SUBCUTANEOUS
  Administered 2021-01-14: 2 [IU] via SUBCUTANEOUS
  Administered 2021-01-15: 7 [IU] via SUBCUTANEOUS
  Administered 2021-01-15: 1 [IU] via SUBCUTANEOUS
  Filled 2021-01-09: qty 0.09

## 2021-01-09 MED ORDER — SODIUM CHLORIDE 0.9 % IV BOLUS
1000.0000 mL | Freq: Once | INTRAVENOUS | Status: AC
  Administered 2021-01-09: 1000 mL via INTRAVENOUS

## 2021-01-09 MED ORDER — PANTOPRAZOLE SODIUM 40 MG PO TBEC
40.0000 mg | DELAYED_RELEASE_TABLET | Freq: Two times a day (BID) | ORAL | Status: DC
  Administered 2021-01-09 – 2021-01-15 (×12): 40 mg via ORAL
  Filled 2021-01-09 (×12): qty 1

## 2021-01-09 MED ORDER — ENOXAPARIN SODIUM 30 MG/0.3ML IJ SOSY
30.0000 mg | PREFILLED_SYRINGE | INTRAMUSCULAR | Status: DC
  Administered 2021-01-09 – 2021-01-14 (×6): 30 mg via SUBCUTANEOUS
  Filled 2021-01-09 (×6): qty 0.3

## 2021-01-09 MED ORDER — CHLORHEXIDINE GLUCONATE CLOTH 2 % EX PADS
6.0000 | MEDICATED_PAD | Freq: Every day | CUTANEOUS | Status: DC
  Administered 2021-01-10 – 2021-01-14 (×5): 6 via TOPICAL

## 2021-01-09 MED ORDER — AMLODIPINE BESYLATE 10 MG PO TABS
10.0000 mg | ORAL_TABLET | Freq: Every morning | ORAL | Status: DC
  Administered 2021-01-10 – 2021-01-14 (×5): 10 mg via ORAL
  Filled 2021-01-09 (×5): qty 1

## 2021-01-09 MED ORDER — ADULT MULTIVITAMIN W/MINERALS CH
1.0000 | ORAL_TABLET | Freq: Every day | ORAL | Status: DC
  Administered 2021-01-10 – 2021-01-15 (×6): 1 via ORAL
  Filled 2021-01-09 (×6): qty 1

## 2021-01-09 MED ORDER — TRIAMCINOLONE ACETONIDE 0.5 % EX OINT
1.0000 "application " | TOPICAL_OINTMENT | Freq: Two times a day (BID) | CUTANEOUS | Status: DC | PRN
  Filled 2021-01-09: qty 15

## 2021-01-09 MED ORDER — MEMANTINE HCL 10 MG PO TABS
10.0000 mg | ORAL_TABLET | Freq: Two times a day (BID) | ORAL | Status: DC
  Administered 2021-01-09 – 2021-01-15 (×12): 10 mg via ORAL
  Filled 2021-01-09 (×12): qty 1

## 2021-01-09 MED ORDER — SODIUM CHLORIDE 0.9 % IV SOLN: INTRAVENOUS | Status: DC

## 2021-01-09 MED ORDER — SODIUM CHLORIDE 0.9% FLUSH
10.0000 mL | INTRAVENOUS | Status: DC | PRN
  Administered 2021-01-10 – 2021-01-14 (×2): 10 mL

## 2021-01-09 MED ORDER — LEVETIRACETAM 500 MG PO TABS
500.0000 mg | ORAL_TABLET | Freq: Two times a day (BID) | ORAL | Status: DC
  Administered 2021-01-09 – 2021-01-15 (×12): 500 mg via ORAL
  Filled 2021-01-09 (×12): qty 1

## 2021-01-09 NOTE — ED Notes (Signed)
Lactic 2.5. EDP notified and aware.

## 2021-01-09 NOTE — H&P (Signed)
History and Physical    KYE SILVERSTEIN KGU:[id number] DOB: 1940-11-03 DOA: 01/09/2021  PCP: Mike Craze, DO   Patient coming from: Home  I have personally briefly reviewed patient's old medical records in [place]  Chief Complaint: Confusion  HPI: [name] is a 80 y.o. 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  Patient's daughter at bedside and has given [place] most of the history. Raquel Sarna reports that she was unable to be aroused earlier this morning was found to be dehydrated. She was recently at seen in ED for similar complaints was given IV fluids and was discharged home. Patient presents again today with similar complaints of not being aroused, not taking anything by mouth for 2 days.  No fever or chills no nausea vomiting or diarrhea at this time   ED Course: On arrival to ED patient temp is 96.6, blood pressure 152/62, labs were remarkable for creatinine of 2, BUN of 24 and bicarb of 19, lactic acid of 2.5, CBC within normal limits, respiratory panel is negative, urine analysis is negative for infection.  Chest x-ray shows cardiomegaly without any source of infection. CT of the head without contrast Unchanged global parenchymal volume loss and chronic white matter Microangiopathy.  Blood cultures and urine cultures ordered and are pending at this time.   She was referred to [name] for admission for dehydration and acute metabolic encephalopathy  Review of Systems: Patient is alert and oriented to person only currently denies any new complaints.. Past Medical History:  Diagnosis Date   Chronic anemia    Chronic diastolic CHF (congestive heart failure) ([place]) 10/03/2013   Chronic GI bleeding    /notes 11/29/2014   Family history of anesthesia complication    "niece has a hard  time coming out" (09/15/2012)   Frequent nosebleeds    chronic   Gastric AV malformation    /notes 11/29/2014   GERD (gastroesophageal reflux disease)    Heart murmur 04/01/2017   Moderate AVSC on echo 09/2016   History of blood transfusion "several"   History of epistaxis    HTN (hypertension), benign 03/02/2012   Hyperlipidemia    Iron deficiency anemia    chronic infusions"   Lichen planus    Both lower extremities   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    Past Surgical History:  Procedure Laterality Date   CATARACT EXTRACTION     "I think it was just one eye"   ESOPHAGOGASTRODUODENOSCOPY  02/26/2011   Procedure: ESOPHAGOGASTRODUODENOSCOPY (EGD);  Surgeon: [name], MD;  Location[name];  Service: Endoscopy;  Laterality: N/A;   ESOPHAGOGASTRODUODENOSCOPY N/A 11/08/2012   Procedure: ESOPHAGOGASTRODUODENOSCOPY (EGD);  Surgeon: [name], MD;  Location: [name];  Service: Endoscopy;  Laterality: N/A;   ESOPHAGOGASTRODUODENOSCOPY N/A 10/04/2013   Procedure: ESOPHAGOGASTRODUODENOSCOPY (EGD);  Surgeon: Winfield Cunas., MD;  Location: [name];  Service: Endoscopy;  Laterality: N/A;  with APC on stand-by   ESOPHAGOGASTRODUODENOSCOPY N/A 07/06/2014   Procedure:  ESOPHAGOGASTRODUODENOSCOPY (EGD);  Surgeon: Clarene Essex, MD;  Location: [name];  Service: Endoscopy;  Laterality: N/A;   ESOPHAGOGASTRODUODENOSCOPY N/A 09/05/2014   Procedure: ESOPHAGOGASTRODUODENOSCOPY (EGD);  Surgeon: Laurence Spates, MD;  Location: [name];  Service: Endoscopy;  Laterality: N/A;  APC on standby to control bleeding   ESOPHAGOGASTRODUODENOSCOPY N/A 11/29/2014   Procedure:  ESOPHAGOGASTRODUODENOSCOPY (EGD);  Surgeon: [name], MD;  Location: [facility] ENDOSCOPY;  Service: Endoscopy;  Laterality: N/A;   ESOPHAGOGASTRODUODENOSCOPY N/A 09/28/2015   Procedure: ESOPHAGOGASTRODUODENOSCOPY (EGD);  Surgeon: Clarene Essex, MD;  Location: [facility] ENDOSCOPY;  Service: Endoscopy;  Laterality: N/A;   ESOPHAGOGASTRODUODENOSCOPY (EGD) WITH PROPOFOL N/A 12/04/2017   Procedure: ESOPHAGOGASTRODUODENOSCOPY (EGD) WITH PROPOFOL;  Surgeon: [name], MD;  Location: [place];  Service: Endoscopy;  Laterality: N/A;   ESOPHAGOGASTRODUODENOSCOPY (EGD) WITH PROPOFOL N/A 12/24/2020   Procedure: ESOPHAGOGASTRODUODENOSCOPY (EGD) WITH PROPOFOL;  Surgeon: Arta Silence, MD;  Location: [place];  Service: Endoscopy;  Laterality: N/A;   ESOPHAGOGASTRODUODENOSCOPY ENDOSCOPY  08/19/2006   with laser treatment   HEMOSTASIS CLIP PLACEMENT  12/24/2020   Procedure: HEMOSTASIS CLIP PLACEMENT;  Surgeon: Arta Silence, MD;  Location: [place];  Service: Endoscopy;;   HOT HEMOSTASIS N/A 07/06/2014   Procedure: HOT HEMOSTASIS (ARGON PLASMA COAGULATION/BICAP);  Surgeon: Clarene Essex, MD;  Location: [name];  Service: Endoscopy;  Laterality: N/A;   HOT HEMOSTASIS N/A 09/28/2015   Procedure: HOT HEMOSTASIS (ARGON PLASMA COAGULATION/BICAP);  Surgeon: Clarene Essex, MD;  Location: [facility] ENDOSCOPY;  Service: Endoscopy;  Laterality: N/A;   HOT HEMOSTASIS N/A 12/04/2017   Procedure: HOT HEMOSTASIS (ARGON PLASMA COAGULATION/BICAP);  Surgeon: [name], MD;  Location: [place];  Service: Endoscopy;  Laterality: N/A;   NASAL HEMORRHAGE CONTROL     "for bleeding"    SAVORY DILATION  02/26/2011   Procedure: SAVORY DILATION;  Surgeon: [name], MD;  Location: [name];  Service: Endoscopy;  Laterality: N/A;  c-arm needed   SCLEROTHERAPY  12/24/2020   Procedure: SCLEROTHERAPY;  Surgeon: Arta Silence, MD;  Location: [facility] ENDOSCOPY;  Service: Endoscopy;;   SUBMUCOSAL INJECTION  12/04/2017    Procedure: SUBMUCOSAL INJECTION;  Surgeon: [name], MD;  Location: [facility] ENDOSCOPY;  Service: Endoscopy;;    Social History  reports that she quit smoking about 49 years ago. Her smoking use included cigarettes. She has a 20.00 pack-year smoking history. She has never used smokeless tobacco. She reports that she does not drink alcohol and does not use drugs.  Allergies  Allergen Reactions   Aspirin Nausea And Vomiting    Family History  Problem Relation Age of Onset   Healthy Mother    Stroke Father    Diabetes Niece    Breast cancer Other    Malignant hyperthermia Neg Hx    Seizures Neg Hx    Family history reviewed , not pertinent.   Prior to Admission medications   Medication Sig Start Date End Date Taking? Authorizing Provider  amLODipine (NORVASC) 10 MG tablet TAKE 1 TABLET BY MOUTH EVERY MORNING Patient taking differently: Take 10 mg by mouth daily. 10/07/20  Yes [name], Areeg N, DO  diclofenac Sodium (VOLTAREN) 1 % GEL Apply 2 g topically 4 (four) times daily. Patient taking differently: Apply 2 g topically 4 (four) times daily as needed (pain). 12/26/20  Yes Lacinda Axon, MD  gabapentin (NEURONTIN) 100 MG capsule TAKE 1 CAPSULE BY MOUTH TWICE DAILY Patient taking differently: Take 100 mg by mouth 2 (two) times daily. 08/16/20  Yes Katsadouros, Vasilios, MD  HUMALOG MIX 75/25 KWIKPEN (75-25) 100  UNIT/ML Kwikpen INJECT 10 UNITS SUBCUTANEOUSLY ONCE DAILY Patient taking differently: Inject 8 Units into the skin daily. 04/17/20  Yes Elayne Snare, MD  insulin lispro (HUMALOG KWIKPEN) 100 UNIT/ML KwikPen 3 units before dinner Patient taking differently: Inject 5 Units into the skin at bedtime. 10/03/20  Yes Elayne Snare, MD  levETIRAcetam (KEPPRA) 500 MG tablet TAKE 1 TABLET BY MOUTH TWICE DAILY Patient taking differently: Take 500 mg by mouth 2 (two) times daily. 06/07/20  Yes [name], Areeg N, DO  memantine (NAMENDA) 10 MG tablet TAKE 1 TABLET BY MOUTH TWICE DAILY Patient  taking differently: Take 10 mg by mouth 2 (two) times daily. 11/11/20  Yes [name], Areeg N, DO  Misc Natural Products (IMMUNE FORMULA PO) Take 1 tablet by mouth daily.   Yes [provider]  Multiple Vitamins-Minerals (MULTI FOR HER 50+) TABS Take 1 tablet by mouth daily with breakfast.   Yes [provider]  olmesartan (BENICAR) 20 MG tablet Take 10 mg by mouth daily. 09/26/20  Yes [provider]  pantoprazole (PROTONIX) 40 MG tablet Take 1 tablet (40 mg total) by mouth 2 (two) times daily. 12/26/20  Yes Lacinda Axon, MD  potassium chloride SA (KLOR-CON M) 20 MEQ tablet Take 20 mEq by mouth 2 (two) times daily.   Yes [provider]  triamcinolone ointment (KENALOG) 0.5 % APPLY 1 APPLICATION TOPICALLY TO RASH TWICE DAILY FOR ITCHING Patient taking differently: Apply 1 application topically 2 (two) times daily as needed (for itching on legs). 08/16/20  Yes Katsadouros, Vasilios, MD  TRULICITY 1.5 WN/0.2VO SOPN INJECT 1.5MG  SUBCUTANEOUSLY EVERY WEEK ON THURSDAY Patient taking differently: Inject 1.5 mg into the skin every Thursday. 07/29/20  Yes Elayne Snare, MD  ACCU-CHEK GUIDE test strip USE AS INSTRUCTED TO CHECK BLOOD SUGAR 3 TIMES DAILY. 08/08/20   Elayne Snare, MD  Accu-Chek Softclix Lancets lancets 1 each by Other route 3 (three) times daily. Use as instructed to check blood sugar 3 times per day dx code E11.65 02/01/20   Elayne Snare, MD  Continuous Blood Gluc Sensor (FREESTYLE LIBRE 2 SENSOR) MISC 2 Devices by Does not apply route every 14 (fourteen) days. Patient not taking: Reported on 12/31/2020 04/25/20   Elayne Snare, MD  Elastic Bandages & Supports ([place]) [place] Wear as much as possible while awake to reduce swelling 04/27/16   Minus Liberty, MD  glucose blood (ACCU-CHEK GUIDE) test strip USE AS INSTRUCTED TO CHECK BLOOD SUGAR 3 TIMES DAILY. 07/13/20   [provider]  Insulin Pen Needle (B-D UF III MINI PEN NEEDLES)  31G X 5 MM MISC USE TO INJECT INSULIN THREE TIMES A DAY 12/23/20   Elayne Snare, MD  Lancets MISC 1 each by Other route. 02/01/20   [provider]    Physical Exam: Vitals:   01/09/21 1430 01/09/21 1445 01/09/21 1500 01/09/21 1515  BP: (!) 152/62 (!) 164/64 (!) 161/61 (!) 160/68  Pulse: 67 68 65 67  Resp: 14 18 13 14   Temp: (!) 97.1 F (36.2 C) (!) 97.1 F (36.2 C) (!) 97.1 F (36.2 C) (!) 97.1 F (36.2 C)  TempSrc:      SpO2: 100% 100% 100% 100%  Weight:      Height:        Constitutional: NAD, calm, comfortable Vitals:   01/09/21 1430 01/09/21 1445 01/09/21 1500 01/09/21 1515  BP: (!) 152/62 (!) 164/64 (!) 161/61 (!) 160/68  Pulse: 67 68 65 67  Resp: 14 18 13 14   Temp: Marland Kitchen)  97.1 F (36.2 C) (!) 97.1 F (36.2 C) (!) 97.1 F (36.2 C) (!) 97.1 F (36.2 C)  TempSrc:      SpO2: 100% 100% 100% 100%  Weight:      Height:       Eyes: PERRL, lids and conjunctivae normal ENMT: Mucous membranes are dry.  Neck: normal, supple, no masses, no thyromegaly Respiratory: clear to auscultation bilaterally, no wheezing, no crackles. Normal respiratory effort. No accessory muscle use.  Cardiovascular: Regular rate and rhythm, no murmurs / rubs / gallops. No extremity edema. 2+ pedal pulses. No carotid bruits.  Abdomen: no tenderness, no masses palpated. No hepatosplenomegaly. Bowel sounds positive.  Musculoskeletal: no clubbing / cyanosis. No joint deformity upper and lower extremities. Good ROM, no contractures. Normal muscle tone.  Skin: no rashes, lesions, ulcers. No induration Neurologic: CN 2-12 grossly intact. Sensation intact. Alert and oriented to person  only.  Psychiatric: pleasant.   Labs on Admission: I have personally reviewed following labs and imaging studies  CBC: Recent Labs  Lab 01/09/21 1353  WBC 6.0  NEUTROABS 3.4  HGB 14.3  HCT 43.3  MCV 92.1  PLT 443    Basic Metabolic Panel: Recent Labs  Lab 01/09/21 1353  NA 141  K 4.8  CL 111  CO2 19*   GLUCOSE 192*  BUN 24*  CREATININE 2.00*  CALCIUM 9.5    GFR: Estimated Creatinine Clearance: 20.2 mL/min (A) (by C-G formula based on SCr of 2 mg/dL (H)).  Liver Function Tests: Recent Labs  Lab 01/09/21 1353  AST 24  ALT 17  ALKPHOS 128*  BILITOT 1.0  PROT 7.3  ALBUMIN 3.8    Urine analysis:    Component Value Date/Time   COLORURINE STRAW (A) 01/09/2021 1348   APPEARANCEUR CLEAR 01/09/2021 1348   LABSPEC 1.006 01/09/2021 1348   LABSPEC 1.025 10/19/2006 1254   PHURINE 5.0 01/09/2021 1348   GLUCOSEU NEGATIVE 01/09/2021 [place] 01/09/2021 [place] 01/09/2021 1348   BILIRUBINUR neg 05/21/2016 1605   BILIRUBINUR Negative 10/19/2006 1254   KETONESUR NEGATIVE 01/09/2021 1348   PROTEINUR NEGATIVE 01/09/2021 1348   UROBILINOGEN 0.2 05/21/2016 1605   UROBILINOGEN 0.2 10/02/2014 2307   NITRITE NEGATIVE 01/09/2021 1348   LEUKOCYTESUR NEGATIVE 01/09/2021 1348   LEUKOCYTESUR Negative 10/19/2006 1254    Radiological Exams on Admission: CT Head Wo Contrast  Result Date: 01/09/2021 CLINICAL DATA:  Altered mental status confusion EXAM: CT HEAD WITHOUT CONTRAST TECHNIQUE: Contiguous axial images were obtained from the base of the skull through the vertex without intravenous contrast. COMPARISON:  Brain MRI 08/28/2014, CT head 12/31/2020 FINDINGS: Brain: There is no evidence of acute intracranial hemorrhage, extra-axial fluid collection, or acute infarct. There is unchanged mild global parenchymal volume loss. The ventricles are stable in size. Confluent hypodensity in the subcortical and periventricular white matter is unchanged, likely reflecting sequela of advanced chronic white matter microangiopathy. There is no solid mass lesion.  There is no midline shift. Vascular: There is calcification of the bilateral cavernous ICAs. Skull: Normal. Negative for fracture or focal lesion. Sinuses/Orbits: There is mild mucosal thickening in the left maxillary  sinus. Bilateral lens implants are in place. The globes and orbits are otherwise unremarkable. Other: None. IMPRESSION: 1. No acute intracranial pathology. 2. Unchanged global parenchymal volume loss and chronic white matter microangiopathy. Electronically Signed   By: Valetta Mole M.D.   On: 01/09/2021 14:35   DG Chest Portable 1 View  Result Date: 01/09/2021 CLINICAL DATA:  Altered mental status EXAM: PORTABLE CHEST 1 VIEW COMPARISON:  Chest x-ray 12/31/2020 FINDINGS: Heart is enlarged. Mediastinum appears stable. Calcified plaques in the aortic arch. Right-sided central venous port with the tip in the SVC. Pulmonary vasculature appears within normal limits. No focal consolidation identified. No pleural effusion or pneumothorax. IMPRESSION: Cardiomegaly with no acute process identified. Electronically Signed   By: Ofilia Neas M.D.   On: 01/09/2021 14:36    EKG: was not done.   Assessment/Plan Principal Problem:   Acute metabolic encephalopathy  Acute metabolic encephalopathy probably secondary to AKI, dehydration in the setting of underlying dementia. Patient's lactic acid elevated 2.5. Continue with IV fluids for the next 24 hours and repeat renal parameters in the morning. Patient is more alert when compared to a few hours ago, she is oriented to person now.  And able to answer simple questions.  She is requesting food and water at this time. CT of the head without contrast does not show any acute intracranial abnormality at this time.  Recent work-up has been negative so far for vitamin deficiencies.    Mild dementia Patient on Namenda No agitation or behavioral abnormalities at this time    Hypertension Resume home medications. Blood pressure parameters are slightly elevated.    Type 2 diabetes mellitus  started on sliding scale. Hemoglobin A1c pending    AKI: Started on gentle hydration.  Repeat renal parameters in am.  UA Is negative for infection.     DVT  prophylaxis: Lovenox Code Status:   Full code Family Communication:  Family at bedside disposition Plan:   Patient is from:  Home  Anticipated DC to:  Home with home health  Anticipated DC date:  Possibly tomorrow  Anticipated DC barriers: Resolution of dehydration and AKI  Consults called:  None Admission status:  Observations/ Med Surg  Severity of Illness: The appropriate patient status for this patient is OBSERVATION. Observation status is judged to be reasonable and necessary in order to provide the required intensity of service to ensure the patient's safety. The patient's presenting symptoms, physical exam findings, and initial radiographic and laboratory data in the context of their medical condition is felt to place them at decreased risk for further clinical deterioration. Furthermore, it is anticipated that the patient will be medically stable for discharge from the hospital within 2 midnights of admission.     Hosie Poisson MD [facility]  How to contact the [facility] Attending or Consulting provider [place] or covering provider during after hours [place], for this patient?   Check the care team in [name] and look for a) attending/consulting TR[facility] provider listed and b) the [facility] team listed Log into [email or web address] and use [place]'s universal password to access. If you do not have the password, please contact the hospital operator. Locate the [facility] provider you are looking for under [facility] and page to a number that you can be directly reached. If you still have difficulty reaching the provider, please page the [facility] (Director on Call) for the Hospitalists listed on amion for assistance.  01/09/2021, 5:09 PM

## 2021-01-09 NOTE — ED Triage Notes (Signed)
Pt presents to the ED from home where she lives with her grandchildren. Pt BIB EMS. Per EMS, over the past two days the pt has become progressively more confused and weak. Per EMS, pt normally walks at home and today pt has been non-ambulatory and has not had anything PO in the past two days. Per EMS, pt has a hx of GI bleed and had received transfusions in the past. Other PMHx includes CHF, DM, HTN. Hx provided by EMS.   VS en route inmclude:  155/58 HR 60 RR: 18 100% SPO2 on room air.  CBG 230 GCS 13

## 2021-01-09 NOTE — ED Notes (Signed)
EDP at the bedside to evaluate.  

## 2021-01-09 NOTE — ED Notes (Signed)
Hospitalist at the bedside 

## 2021-01-09 NOTE — ED Notes (Signed)
Pt's foley cath temp 96.93F. Pt placed on bear hugger at this time.

## 2021-01-09 NOTE — ED Provider Notes (Signed)
[facility] DEPT Provider Note   CSN: [id number] Arrival date & time: 01/09/21  1330     History Chief Complaint  Patient presents with   Failure To Thrive    Ladawna Walgren Catalina is a 80 y.o. female.  Pt presents to the ED today with [facility].  Pt lives at home with her family and normally can walk and do things for herself.  Per EMS, for the past 2 days, she has become progressively more weak.  She has not been able to walk and has not eaten anything for 2 days.  Pt was admitted to [facility] on 11/22 to 11/23 for AMS.  By the 23rd, she was awake and alert and able to ambulate for PT.  Pt is unable to give any hx.      Past Medical History:  Diagnosis Date   Chronic anemia    Chronic diastolic CHF (congestive heart failure) ([place]) 10/03/2013   Chronic GI bleeding    /notes 11/29/2014   Family history of anesthesia complication    "niece has a hard time coming out" (09/15/2012)   Frequent nosebleeds    chronic   Gastric AV malformation    /notes 11/29/2014   GERD (gastroesophageal reflux disease)    Heart murmur 04/01/2017   Moderate AVSC on echo 09/2016   History of blood transfusion "several"   History of epistaxis    HTN (hypertension), benign 03/02/2012   Hyperlipidemia    Iron deficiency anemia    chronic infusions"   Lichen planus    Both lower extremities   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    Patient Active Problem List   Diagnosis Date Noted   Altered mental status 12/31/2020   Acute upper GI bleed 12/23/2020   Hemorrhagic shock ([name]    Bullous lesion 10/08/2020   Hypoglycemia 09/29/2020   Impacted cerumen of left ear 07/15/2020   Hearing  loss 05/29/2020   Abnormal LFTs 08/09/2019   Hypervolemia due to congestive heart failure ([place]) 08/02/2019   Neuropathy 07/27/2019   Hyperkalemia 10/18/2018   Port-A-Cath in place 05/23/2018   Iron deficiency anemia due to chronic blood loss 04/19/2018   Acute right hip pain 03/10/2018   Hyperpigmented skin lesion 02/07/2018   Leg weakness, bilateral 12/23/2017   Hematemesis 12/04/2017   Pain localized to upper abdomen 10/05/2017   Lower extremity pain, bilateral 07/15/2017   Pulmonary HTN ([place]) 04/01/2017   Heart murmur 04/01/2017   Overgrown toenails 03/18/2017   Osteopenia after menopause 01/13/2017   Epistaxis 07/30/2016   Venous insufficiency of both lower extremities 04/27/2016   Anemia due to chronic blood loss    Dry skin 02/17/2016   Peripheral vascular disease ([place]) 12/30/2015   Pain in joint of right shoulder 10/22/2015   PAF (paroxysmal atrial fibrillation) ([place]) 66/59/9357   Lichen planus 01/77/9390   Healthcare maintenance 06/20/2015   Seborrheic keratosis 06/20/2015   Memory loss 01/30/2015   Simple febrile convulsions ([place]) 10/21/2014   Hyperammonemia ([place]) 10/03/2014   CKD (chronic kidney disease), stage III ([name]    Seizures ([place]) 09/2014   Acute kidney injury ([place]) 08/27/2014  Chronic diastolic CHF (congestive heart failure) ([place]) 10/03/2013   Acute on chronic heart failure with preserved ejection fraction (HFpEF) ([place]) 10/03/2013   Type 2 diabetes mellitus without complication ([place]) 38/46/6599   HTN (hypertension), benign 03/02/2012   Fatigue 07/10/2011   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    Past Surgical History:  Procedure Laterality Date   CATARACT EXTRACTION     "I think it was just one eye"   ESOPHAGOGASTRODUODENOSCOPY  02/26/2011   Procedure: ESOPHAGOGASTRODUODENOSCOPY (EGD);  Surgeon: [name], MD;  Location: [name];  Service: Endoscopy;  Laterality: N/A;    ESOPHAGOGASTRODUODENOSCOPY N/A 11/08/2012   Procedure: ESOPHAGOGASTRODUODENOSCOPY (EGD);  Surgeon: [name], MD;  Location: [name];  Service: Endoscopy;  Laterality: N/A;   ESOPHAGOGASTRODUODENOSCOPY N/A 10/04/2013   Procedure: ESOPHAGOGASTRODUODENOSCOPY (EGD);  Surgeon: Winfield Cunas., MD;  Location: [name];  Service: Endoscopy;  Laterality: N/A;  with APC on stand-by   ESOPHAGOGASTRODUODENOSCOPY N/A 07/06/2014   Procedure: ESOPHAGOGASTRODUODENOSCOPY (EGD);  Surgeon: Clarene Essex, MD;  Location: [name];  Service: Endoscopy;  Laterality: N/A;   ESOPHAGOGASTRODUODENOSCOPY N/A 09/05/2014   Procedure: ESOPHAGOGASTRODUODENOSCOPY (EGD);  Surgeon: Laurence Spates, MD;  Location: [name];  Service: Endoscopy;  Laterality: N/A;  APC on standby to control bleeding   ESOPHAGOGASTRODUODENOSCOPY N/A 11/29/2014   Procedure: ESOPHAGOGASTRODUODENOSCOPY (EGD);  Surgeon: [name], MD;  Location: [facility] ENDOSCOPY;  Service: Endoscopy;  Laterality: N/A;   ESOPHAGOGASTRODUODENOSCOPY N/A 09/28/2015   Procedure: ESOPHAGOGASTRODUODENOSCOPY (EGD);  Surgeon: Clarene Essex, MD;  Location: [facility] ENDOSCOPY;  Service: Endoscopy;  Laterality: N/A;   ESOPHAGOGASTRODUODENOSCOPY (EGD) WITH PROPOFOL N/A 12/04/2017   Procedure: ESOPHAGOGASTRODUODENOSCOPY (EGD) WITH PROPOFOL;  Surgeon: [name], MD;  Location: [place];  Service: Endoscopy;  Laterality: N/A;   ESOPHAGOGASTRODUODENOSCOPY (EGD) WITH PROPOFOL N/A 12/24/2020   Procedure: ESOPHAGOGASTRODUODENOSCOPY (EGD) WITH PROPOFOL;  Surgeon: Arta Silence, MD;  Location: [place];  Service: Endoscopy;  Laterality: N/A;   ESOPHAGOGASTRODUODENOSCOPY ENDOSCOPY  08/19/2006   with laser treatment   HEMOSTASIS CLIP PLACEMENT  12/24/2020   Procedure: HEMOSTASIS CLIP PLACEMENT;  Surgeon: Arta Silence, MD;  Location: [place];  Service: Endoscopy;;   HOT HEMOSTASIS N/A 07/06/2014   Procedure: HOT HEMOSTASIS (ARGON PLASMA COAGULATION/BICAP);   Surgeon: Clarene Essex, MD;  Location: [name];  Service: Endoscopy;  Laterality: N/A;   HOT HEMOSTASIS N/A 09/28/2015   Procedure: HOT HEMOSTASIS (ARGON PLASMA COAGULATION/BICAP);  Surgeon: Clarene Essex, MD;  Location: [facility] ENDOSCOPY;  Service: Endoscopy;  Laterality: N/A;   HOT HEMOSTASIS N/A 12/04/2017   Procedure: HOT HEMOSTASIS (ARGON PLASMA COAGULATION/BICAP);  Surgeon: [name], MD;  Location: [place];  Service: Endoscopy;  Laterality: N/A;   NASAL HEMORRHAGE CONTROL     "for bleeding"    SAVORY DILATION  02/26/2011   Procedure: SAVORY DILATION;  Surgeon: [name], MD;  Location: [name];  Service: Endoscopy;  Laterality: N/A;  c-arm needed   SCLEROTHERAPY  12/24/2020   Procedure: SCLEROTHERAPY;  Surgeon: Arta Silence, MD;  Location: [facility] ENDOSCOPY;  Service: Endoscopy;;   SUBMUCOSAL INJECTION  12/04/2017   Procedure: SUBMUCOSAL INJECTION;  Surgeon: [name], MD;  Location: [facility] ENDOSCOPY;  Service: Endoscopy;;     OB History   No obstetric history on file.     Family History  Problem Relation Age of Onset   Healthy Mother    Stroke Father    Diabetes Niece    Breast cancer Other    Malignant hyperthermia Neg Hx    Seizures Neg Hx  Social History   Tobacco Use   Smoking status: Former    Packs/day: 1.00    Years: 20.00    Pack years: 20.00    Types: Cigarettes    Quit date: 02/10/1971    Years since quitting: 49.9   Smokeless tobacco: Never   Tobacco comments:    09/15/2012 "smoked 50-60 yr ago"  Vaping Use   Vaping Use: Never used  Substance Use Topics   Alcohol use: No    Alcohol/week: 0.0 standard drinks   Drug use: No    Home Medications Prior to Admission medications   Medication Sig Start Date End Date Taking? Authorizing Provider  amLODipine (NORVASC) 10 MG tablet TAKE 1 TABLET BY MOUTH EVERY MORNING Patient taking differently: Take 10 mg by mouth daily. 10/07/20  Yes [name], Areeg N, DO  diclofenac Sodium (VOLTAREN) 1 %  GEL Apply 2 g topically 4 (four) times daily. Patient taking differently: Apply 2 g topically 4 (four) times daily as needed (pain). 12/26/20  Yes Lacinda Axon, MD  gabapentin (NEURONTIN) 100 MG capsule TAKE 1 CAPSULE BY MOUTH TWICE DAILY Patient taking differently: Take 100 mg by mouth 2 (two) times daily. 08/16/20  Yes Katsadouros, Vasilios, MD  HUMALOG MIX 75/25 KWIKPEN (75-25) 100 UNIT/ML Kwikpen INJECT 10 UNITS SUBCUTANEOUSLY ONCE DAILY Patient taking differently: Inject 8 Units into the skin daily. 04/17/20  Yes Elayne Snare, MD  insulin lispro (HUMALOG KWIKPEN) 100 UNIT/ML KwikPen 3 units before dinner Patient taking differently: Inject 5 Units into the skin at bedtime. 10/03/20  Yes Elayne Snare, MD  levETIRAcetam (KEPPRA) 500 MG tablet TAKE 1 TABLET BY MOUTH TWICE DAILY Patient taking differently: Take 500 mg by mouth 2 (two) times daily. 06/07/20  Yes [name], Areeg N, DO  memantine (NAMENDA) 10 MG tablet TAKE 1 TABLET BY MOUTH TWICE DAILY Patient taking differently: Take 10 mg by mouth 2 (two) times daily. 11/11/20  Yes [name], Areeg N, DO  Misc Natural Products (IMMUNE FORMULA PO) Take 1 tablet by mouth daily.   Yes [provider]  Multiple Vitamins-Minerals (MULTI FOR HER 50+) TABS Take 1 tablet by mouth daily with breakfast.   Yes [provider]  olmesartan (BENICAR) 20 MG tablet Take 10 mg by mouth daily. 09/26/20  Yes [provider]  pantoprazole (PROTONIX) 40 MG tablet Take 1 tablet (40 mg total) by mouth 2 (two) times daily. 12/26/20  Yes Lacinda Axon, MD  potassium chloride SA (KLOR-CON M) 20 MEQ tablet Take 20 mEq by mouth 2 (two) times daily.   Yes [provider]  triamcinolone ointment (KENALOG) 0.5 % APPLY 1 APPLICATION TOPICALLY TO RASH TWICE DAILY FOR ITCHING Patient taking differently: Apply 1 application topically 2 (two) times daily as needed (for itching on legs). 08/16/20  Yes Katsadouros, Vasilios, MD  TRULICITY 1.5 EX/5.2WU  SOPN INJECT 1.5MG  SUBCUTANEOUSLY EVERY WEEK ON THURSDAY Patient taking differently: Inject 1.5 mg into the skin every Thursday. 07/29/20  Yes Elayne Snare, MD  ACCU-CHEK GUIDE test strip USE AS INSTRUCTED TO CHECK BLOOD SUGAR 3 TIMES DAILY. 08/08/20   Elayne Snare, MD  Accu-Chek Softclix Lancets lancets 1 each by Other route 3 (three) times daily. Use as instructed to check blood sugar 3 times per day dx code E11.65 02/01/20   Elayne Snare, MD  Continuous Blood Gluc Sensor (FREESTYLE LIBRE 2 SENSOR) MISC 2 Devices by Does not apply route every 14 (fourteen) days. Patient not taking: Reported on 12/31/2020 04/25/20   Elayne Snare, MD  Elastic  Bandages & Supports (MEDICAL COMPRESSION STOCKINGS) MISC Wear as much as possible while awake to reduce swelling 04/27/16   Minus Liberty, MD  glucose blood (ACCU-CHEK GUIDE) test strip USE AS INSTRUCTED TO CHECK BLOOD SUGAR 3 TIMES DAILY. 07/13/20   [provider]  Insulin Pen Needle (B-D UF III MINI PEN NEEDLES) 31G X 5 MM MISC USE TO INJECT INSULIN THREE TIMES A DAY 12/23/20   Elayne Snare, MD  Lancets MISC 1 each by Other route. 02/01/20   [provider]    Allergies    Aspirin  Review of Systems   Review of Systems  Unable to perform ROS: Mental status change   Physical Exam Updated Vital Signs BP (!) 160/68   Pulse 67   Temp (!) 97.1 F (36.2 C)   Resp 14   Ht 5\' 3"  (1.6 m)   Wt 64.2 kg   LMP  (LMP Unknown)   SpO2 100%   BMI 25.07 kg/m   Physical Exam Vitals and nursing note reviewed.  Constitutional:      Appearance: She is ill-appearing.  HENT:     Head: Normocephalic and atraumatic.     Right Ear: External ear normal.     Left Ear: External ear normal.     Nose: Nose normal.     Mouth/Throat:     Mouth: Mucous membranes are dry.     Pharynx: Oropharynx is clear.  Eyes:     Extraocular Movements: Extraocular movements intact.     Conjunctiva/sclera: Conjunctivae normal.     Pupils: Pupils are equal, round,  and reactive to light.  Cardiovascular:     Rate and Rhythm: Normal rate and regular rhythm.     Pulses: Normal pulses.     Heart sounds: Normal heart sounds.  Pulmonary:     Effort: Pulmonary effort is normal.     Breath sounds: Normal breath sounds.  Abdominal:     General: Abdomen is flat. Bowel sounds are normal.     Palpations: Abdomen is soft.  Musculoskeletal:        General: Normal range of motion.     Cervical back: Normal range of motion and neck supple.  Skin:    General: Skin is warm.     Capillary Refill: Capillary refill takes less than 2 seconds.  Neurological:     Mental Status: She is disoriented.  Psychiatric:     Comments: Unable to assess due to MS    ED Results / Procedures / Treatments   Labs (all labs ordered are listed, but only abnormal results are displayed) Labs Reviewed  COMPREHENSIVE METABOLIC PANEL - Abnormal; Notable for the following components:      Result Value   CO2 19 (*)    Glucose, Bld 192 (*)    BUN 24 (*)    Creatinine, Ser 2.00 (*)    Alkaline Phosphatase 128 (*)    GFR, Estimated 25 (*)    All other components within normal limits  URINALYSIS, ROUTINE W REFLEX MICROSCOPIC - Abnormal; Notable for the following components:   Color, Urine STRAW (*)    All other components within normal limits  LACTIC ACID, PLASMA - Abnormal; Notable for the following components:   Lactic Acid, Venous 2.5 (*)    All other components within normal limits  CBG MONITORING, ED - Abnormal; Notable for the following components:   Glucose-Capillary 185 (*)    All other components within normal limits  CULTURE, BLOOD (ROUTINE X 2)  CULTURE,  BLOOD (ROUTINE X 2)  URINE CULTURE  RESP PANEL BY RT-PCR (FLU A&B, COVID) [id number]  CBC WITH DIFFERENTIAL/PLATELET  LACTIC ACID, PLASMA    EKG EKG Interpretation  Date/Time:  Thursday January 09 2021 14:00:01 EST Ventricular Rate:  68 PR Interval:  176 QRS Duration: 75 QT Interval:  407 QTC  Calculation: 433 R Axis:   34 Text Interpretation: Sinus arrhythmia No significant change since last tracing Confirmed by Isla Pence [phone]) on 01/09/2021 2:06:05 PM  Radiology CT Head Wo Contrast  Result Date: 01/09/2021 CLINICAL DATA:  Altered mental status confusion EXAM: CT HEAD WITHOUT CONTRAST TECHNIQUE: Contiguous axial images were obtained from the base of the skull through the vertex without intravenous contrast. COMPARISON:  Brain MRI 08/28/2014, CT head 12/31/2020 FINDINGS: Brain: There is no evidence of acute intracranial hemorrhage, extra-axial fluid collection, or acute infarct. There is unchanged mild global parenchymal volume loss. The ventricles are stable in size. Confluent hypodensity in the subcortical and periventricular white matter is unchanged, likely reflecting sequela of advanced chronic white matter microangiopathy. There is no solid mass lesion.  There is no midline shift. Vascular: There is calcification of the bilateral cavernous ICAs. Skull: Normal. Negative for fracture or focal lesion. Sinuses/Orbits: There is mild mucosal thickening in the left maxillary sinus. Bilateral lens implants are in place. The globes and orbits are otherwise unremarkable. Other: None. IMPRESSION: 1. No acute intracranial pathology. 2. Unchanged global parenchymal volume loss and chronic white matter microangiopathy. Electronically Signed   By: Valetta Mole M.D.   On: 01/09/2021 14:35   DG Chest Portable 1 View  Result Date: 01/09/2021 CLINICAL DATA:  Altered mental status EXAM: PORTABLE CHEST 1 VIEW COMPARISON:  Chest x-ray 12/31/2020 FINDINGS: Heart is enlarged. Mediastinum appears stable. Calcified plaques in the aortic arch. Right-sided central venous port with the tip in the SVC. Pulmonary vasculature appears within normal limits. No focal consolidation identified. No pleural effusion or pneumothorax. IMPRESSION: Cardiomegaly with no acute process identified. Electronically Signed   By:  Ofilia Neas M.D.   On: 01/09/2021 14:36    Procedures Procedures   Medications Ordered in ED Medications  sodium chloride 0.9 % bolus 1,000 mL (1,000 mLs Intravenous New Bag/Given 01/09/21 1431)    ED Course  I have reviewed the triage vital signs and the nursing notes.  Pertinent labs & imaging results that were available during my care of the patient were reviewed by me and considered in my medical decision making (see chart for details).    MDM Rules/Calculators/A&P                           Pt's god-daughter said she helps patient and her husband.  She requests a hospital bed and home health at d/c.  I have ordered these things.  Pt is mildly hypothermic.  She is placed on a bair hugger.  No sign of infection.  Pt is given IVFs.  She does have a mild AKI.  Pt is still altered.  She is d/w Dr. Karleen Hampshire (triad) for admission. Final Clinical Impression(s) / ED Diagnoses Final diagnoses:  Dehydration  Acute metabolic encephalopathy  AKI (acute kidney injury) ([place])  Hypothermia, initial encounter    Rx / DC Orders ED Discharge Orders     None        Isla Pence, MD 01/09/21 1546

## 2021-01-09 NOTE — ED Notes (Signed)
Pt back from CT at this time 

## 2021-01-09 NOTE — Progress Notes (Addendum)
1201`22/family wants hhc and aide for patient .  requesting hospital bed.  [facility] dme contacted for the hospital bed and given contact information of the niece. Will order hhc once actual order is placed. Hospital bed ordered through [facility] dme. Patient is being admitted will defer hhc orders to floor.

## 2021-01-09 NOTE — ED Notes (Signed)
Pt in CT at this time.

## 2021-01-10 ENCOUNTER — Telehealth: Payer: Self-pay | Admitting: Internal Medicine

## 2021-01-10 ENCOUNTER — Observation Stay (HOSPITAL_COMMUNITY)
Admit: 2021-01-10 | Discharge: 2021-01-10 | Disposition: A | Discharge status: Home / Self Care | Payer: Medicare Other | Attending: Internal Medicine | Admitting: Internal Medicine

## 2021-01-10 DIAGNOSIS — N179 Acute kidney failure, unspecified: Secondary | ICD-10-CM | POA: Diagnosis not present

## 2021-01-10 DIAGNOSIS — G9341 Metabolic encephalopathy: Secondary | ICD-10-CM | POA: Diagnosis not present

## 2021-01-10 DIAGNOSIS — E860 Dehydration: Secondary | ICD-10-CM | POA: Diagnosis not present

## 2021-01-10 LAB — BASIC METABOLIC PANEL
Anion gap: 6 (ref 5–15)
BUN: 20 mg/dL (ref 8–23)
CO2: 23 mmol/L (ref 22–32)
Calcium: 8.6 mg/dL — ABNORMAL LOW (ref 8.9–10.3)
Chloride: 114 mmol/L — ABNORMAL HIGH (ref 98–111)
Creatinine, Ser: 1.63 mg/dL — ABNORMAL HIGH (ref 0.44–1.00)
GFR, Estimated: 32 mL/min — ABNORMAL LOW (ref >60)
Glucose, Bld: 243 mg/dL — ABNORMAL HIGH (ref 70–99)
Potassium: 4.1 mmol/L (ref 3.5–5.1)
Sodium: 143 mmol/L (ref 135–145)

## 2021-01-10 LAB — CBC
HCT: 33.4 % — ABNORMAL LOW (ref 36.0–46.0)
Hemoglobin: 11.2 g/dL — ABNORMAL LOW (ref 12.0–15.0)
MCH: 30 pg (ref 26.0–34.0)
MCHC: 33.5 g/dL (ref 30.0–36.0)
MCV: 89.5 fL (ref 80.0–100.0)
Platelets: 128 10*3/uL — ABNORMAL LOW (ref 150–400)
RBC: 3.73 MIL/uL — ABNORMAL LOW (ref 3.87–5.11)
RDW: 14 % (ref 11.5–15.5)
WBC: 4.7 10*3/uL (ref 4.0–10.5)
nRBC: 0 % (ref 0.0–0.2)

## 2021-01-10 LAB — URINE CULTURE: Culture: NO GROWTH

## 2021-01-10 LAB — GLUCOSE, CAPILLARY
Glucose-Capillary: 146 mg/dL — ABNORMAL HIGH (ref 70–99)
Glucose-Capillary: 257 mg/dL — ABNORMAL HIGH (ref 70–99)
Glucose-Capillary: 256 mg/dL — ABNORMAL HIGH (ref 70–99)
Glucose-Capillary: 316 mg/dL — ABNORMAL HIGH (ref 70–99)

## 2021-01-10 LAB — LACTIC ACID, PLASMA: Lactic Acid, Venous: 1.6 mmol/L (ref 0.5–1.9)

## 2021-01-10 NOTE — Progress Notes (Signed)
EEG complete - results pending 

## 2021-01-10 NOTE — Plan of Care (Signed)
Pt Aox2, pleasantly confused and cooperative with staff. Mentation improving.  Continued IVF per orders. Labs improving.  Foley cath in place, to be removed this afternoon for voiding trial.   Problem: Safety: Goal: Ability to remain free from injury will improve Outcome: Progressing   Problem: Skin Integrity: Goal: Risk for impaired skin integrity will decrease Outcome: Progressing   Problem: Coping: Goal: Level of anxiety will decrease Outcome: Progressing

## 2021-01-10 NOTE — Telephone Encounter (Signed)
Patient's husband would like a call back to discuss getting some preventative measures in place after she is discharged from the hospital.  Patient is currently at  [facility] and was informed that only her PCP could do the home health orders.  Forwarding message to triage.

## 2021-01-10 NOTE — Procedures (Signed)
Patient Name: [name]  MRN: [id number]  Epilepsy Attending: Lora Havens  Referring Physician/Provider: Dr Arline Asp Date: 01/10/2021 Duration: 21.40 mins  Patient history: 80 year old female with history of seizures and dementia now with altered mental status. EEG to evaluate for seizures.  Level of alertness: Awake  AEDs during EEG study: Keppra, gabapentin  Technical aspects: This EEG study was done with scalp electrodes positioned according to the 10-20 International system of electrode placement. Electrical activity was acquired at a sampling rate of 500Hz  and reviewed with a high frequency filter of 70Hz  and a low frequency filter of 1Hz . EEG data were recorded continuously and digitally stored.   Description: The posterior dominant rhythm consists of 8 Hz activity of moderate voltage (25-35 uV) seen predominantly in posterior head regions, symmetric and reactive to eye opening and eye closing.  Hyperventilation and photic stimulation were not performed.     IMPRESSION: This study is within normal limits. No seizures or epileptiform discharges were seen throughout the recording.  Yunique Dearcos Barbra Sarks

## 2021-01-10 NOTE — Evaluation (Signed)
Occupational Therapy Evaluation Patient Details Name: [name] MRN: [id number] DOB: 26-Nov-1940 Today's Date: 01/10/2021   History of Present Illness 80 y.o. F admitted on 01/09/21 due to AMS and increased lethargy.  Pt dx with dehydration, AKI, and metabolic encephalopathy.  PMH significant for Gastric Ulcer (11/17), GI Bleeds, Anemia, HTN, DM2, Seizures.   Clinical Impression   Pt admitted with the above diagnoses and presents with below problem list. Pt will benefit from continued acute OT to address the below listed deficits and maximize independence with basic ADLs prior to d/c home with family and Bruno supports. At baseline, pt endorses struggling with UB/LB bathing/dressing, spouse provides at least set up assist. Pt reports spouse is limited in how much physical assist he can provided. Pt currently needs min guard assist with LB ADLs, functional transfers and mobility. Extra time and effort. Up in recliner at end of session.        Recommendations for follow up therapy are one component of a multi-disciplinary discharge planning process, led by the attending physician.  Recommendations may be updated based on patient status, additional functional criteria and insurance authorization.   Follow Up Recommendations  Home health OT    Assistance Recommended at Discharge Set up Supervision/Assistance  Functional Status Assessment  Patient has had a recent decline in their functional status and demonstrates the ability to make significant improvements in function in a reasonable and predictable amount of time.  Equipment Recommendations  None recommended by OT    Recommendations for Other Services       Precautions / Restrictions Precautions Precautions: Fall Restrictions Weight Bearing Restrictions: No      Mobility Bed Mobility Overal bed mobility: Needs Assistance Bed Mobility: Supine to Sit     Supine to sit: Supervision;HOB elevated Sit to supine: Min assist    General bed mobility comments: Increased time but no physical assist to move supine to sit    Transfers Overall transfer level: Needs assistance Equipment used: Rolling walker (2 wheels) Transfers: Sit to/from Stand Sit to Stand: Min guard           General transfer comment: Steady assist with cues for use of UEs to self assist      Balance Overall balance assessment: Needs assistance Sitting-balance support: No upper extremity supported;Feet supported Sitting balance-Leahy Scale: Good     Standing balance support: Bilateral upper extremity supported Standing balance-Leahy Scale: Poor                             ADL either performed or assessed with clinical judgement   ADL Overall ADL's : Needs assistance/impaired Eating/Feeding: Set up;Sitting   Grooming: Min guard;Standing   Upper Body Bathing: Supervision/ safety;Sitting   Lower Body Bathing: Min guard;Sitting/lateral leans;Sit to/from stand   Upper Body Dressing : Set up;Sitting   Lower Body Dressing: Min guard;Sitting/lateral leans;Sit to/from stand   Toilet Transfer: Min guard;Ambulation   Toileting- Clothing Manipulation and Hygiene: Min guard;Sitting/lateral lean;Sit to/from stand       Functional mobility during ADLs: Min guard;Rolling walker (2 wheels)       Vision Baseline Vision/History: 1 Wears glasses       Perception     Praxis      Pertinent Vitals/Pain Pain Assessment: No/denies pain     Hand Dominance Right   Extremity/Trunk Assessment Upper Extremity Assessment Upper Extremity Assessment: Generalized weakness   Lower Extremity Assessment Lower Extremity Assessment: Defer to PT evaluation  Cervical / Trunk Assessment Cervical / Trunk Assessment: Kyphotic   Communication Communication Communication: No difficulties   Cognition Arousal/Alertness: Awake/alert Behavior During Therapy: WFL for tasks assessed/performed Overall Cognitive Status: No  family/caregiver present to determine baseline cognitive functioning                                       General Comments       Exercises     Shoulder Instructions      Home Living Family/patient expects to be discharged to:: Private residence Living Arrangements: Spouse/significant other Available Help at Discharge: Family Type of Home: House Home Access: Ramped entrance     Home Layout: One level     Bathroom Shower/Tub: Teacher, early years/pre: Standard Bathroom Accessibility: Yes   Home Equipment: Conservation officer, nature (2 wheels);Shower seat;Rollator (4 wheels);BSC/3in1;Wheelchair - manual   Additional Comments: Per pt, she lives with spouse who can assist her "a Suchy".  Niece is also supportive but can only provide intermittent assist      Prior Functioning/Environment Prior Level of Function : Needs assist       Physical Assist : Mobility (physical);ADLs (physical) Mobility (physical): Gait   Mobility Comments: Pt reports using a RW at home and all the time in the community but also reports WC is available          OT Problem List: Decreased strength;Decreased activity tolerance;Impaired balance (sitting and/or standing);Decreased coordination;Decreased safety awareness      OT Treatment/Interventions: Self-care/ADL training;Therapeutic exercise;Energy conservation;DME and/or AE instruction;Therapeutic activities;Patient/family education;Balance training    OT Goals(Current goals can be found in the care plan section) Acute Rehab OT Goals Patient Stated Goal: home OT Goal Formulation: With patient Time For Goal Achievement: 01/15/21 Potential to Achieve Goals: Good ADL Goals Pt Will Perform Lower Body Bathing: sit to/from stand;with min guard assist Pt Will Perform Lower Body Dressing: with modified independence;sit to/from stand Pt Will Transfer to Toilet: with min guard assist;ambulating Pt Will Perform Toileting - Clothing  Manipulation and hygiene: with min guard assist;sit to/from stand  OT Frequency: Min 2X/week   Barriers to D/C:            Co-evaluation PT/OT/SLP Co-Evaluation/Treatment: Yes Reason for Co-Treatment: For patient/therapist safety;To address functional/ADL transfers   OT goals addressed during session: ADL's and self-care      AM-PAC OT "6 Clicks" Daily Activity     Outcome Measure Help from another person eating meals?: A Denomme Help from another person taking care of personal grooming?: A Hannig Help from another person toileting, which includes using toliet, bedpan, or urinal?: A Gillian Help from another person bathing (including washing, rinsing, drying)?: A Reifsteck Help from another person to put on and taking off regular upper body clothing?: A Urbani Help from another person to put on and taking off regular lower body clothing?: A Sherbert 6 Click Score: 18   End of Session Equipment Utilized During Treatment: Rolling walker (2 wheels) Nurse Communication: Mobility status  Activity Tolerance: Patient tolerated treatment well Patient left: in bed;with call bell/phone within reach;with bed alarm set  OT Visit Diagnosis: Unsteadiness on feet (R26.81);Other abnormalities of gait and mobility (R26.89);Muscle weakness (generalized) (M62.81)                Time: 1030-1058 OT Time Calculation (min): 28 min Charges:  OT General Charges $OT Visit: 1 Visit OT Evaluation $OT Eval Moderate Complexity:  [place], OT Acute Rehabilitation Services Pager: [phone] Office: [phone]   Hortencia Pilar 01/10/2021, 2:52 PM

## 2021-01-10 NOTE — Progress Notes (Signed)
PROGRESS NOTE    [name]  ZOX:[id number] DOB: 05-01-1940 DOA: 01/09/2021 PCP: Mike Craze, DO    Chief Complaint  Patient presents with   Failure To Thrive    Brief Narrative:   [name] is a 80 y.o. 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 As per god daughter, this is the second episode where she becomes unresponsive / lethargic after not drinking for two days. Then she comes to ED, gets IV fluids and perks up and is back to baseline.   Assessment & Plan:   Principal Problem:   Acute metabolic encephalopathy   Acute metabolic encephalopathy in the setting of baseline dementia: Probably secondary to AKI/dehydration. Differentials include seizures. Initial CT of the head is negative for acute intracranial abnormality MRI of the brain without contrast ordered for further evaluation.  Since this is the second episode we will get EEG.  History of GI bleed  secondary to gastric AV malformations Continue with PPI.    AKI on stage IIIa CKD Improved with IV fluids.   Type 2 diabetes mellitus Last A!c is 7.8.  Continue with SSI.  Will restart her home meds.    Dementia: Continue with Namenda.    Hypertension:  Well controlled.  Resume amlodipine.  Stop the Benicar.    H/o seizures: Continue with keppra    Diabetic neuropathy:  - continue with Gabapentin.    DVT prophylaxis: (Lovenox) Code Status: (Full code) Family Communication: none at bedside.  Disposition:   Status is: Observation  The patient will require care spanning > 2 midnights and should be moved to inpatient because: unsafe d/c plan.       Consultants:  Palliative care.   Procedures: none.   Antimicrobials:NONE.    Subjective: No new complaints. God daughter wants to know if pt can get IV  fluids.   Objective: Vitals:   01/09/21 2119 01/10/21 0139 01/10/21 0511 01/10/21 0945  BP: (!) 147/57 (!) 121/41 (!) 123/41 119/66  Pulse: 65 60 (!) 53 66  Resp: 18 15 18    Temp: 98.4 F (36.9 C) 98.8 F (37.1 C) (!) 97.4 F (36.3 C)   TempSrc: Oral     SpO2: 100% 97% 96%   Weight:      Height:        Intake/Output Summary (Last 24 hours) at 01/10/2021 1323 Last data filed at 01/10/2021 0955 Gross per 24 hour  Intake 1230.92 ml  Output 570 ml  Net 660.92 ml   Filed Weights   01/09/21 1347  Weight: 64.2 kg    Examination:  General exam: Appears calm and comfortable  Respiratory system: Clear to auscultation. Respiratory effort normal. Cardiovascular system: S1 & S2 heard, RRR. No JVD,  No pedal edema. Gastrointestinal system: Abdomen is nondistended, soft and nontender.  Normal bowel sounds heard. Central nervous system: Alert and oriented to person only.. No focal neurological deficits. Extremities: Symmetric 5 x 5 power. Skin: No rashes, lesions or ulcers Psychiatry: Mood & affect appropriate.     Data Reviewed: I have personally reviewed following labs and imaging studies  CBC: Recent Labs  Lab 01/09/21 1353 01/10/21 0436  WBC 6.0 4.7  NEUTROABS 3.4  --   HGB 14.3 11.2*  HCT 43.3 33.4*  MCV 92.1 89.5  PLT 158 128*    Basic Metabolic Panel: Recent Labs  Lab  01/09/21 1353 01/10/21 0436  NA 141 143  K 4.8 4.1  CL 111 114*  CO2 19* 23  GLUCOSE 192* 243*  BUN 24* 20  CREATININE 2.00* 1.63*  CALCIUM 9.5 8.6*    GFR: Estimated Creatinine Clearance: 24.8 mL/min (A) (by C-G formula based on SCr of 1.63 mg/dL (H)).  Liver Function Tests: Recent Labs  Lab 01/09/21 1353  AST 24  ALT 17  ALKPHOS 128*  BILITOT 1.0  PROT 7.3  ALBUMIN 3.8    CBG: Recent Labs  Lab 01/09/21 1409 01/09/21 2108 01/10/21 0722 01/10/21 1155  GLUCAP 185* 176* 146* 257*     Recent Results (from the past 240 hour(s))  Resp Panel by RT-PCR (Flu A&B, Covid)      Status: None   Collection Time: 12/31/20  7:13 PM   Specimen: Nasopharyngeal(NP) swabs in vial transport medium  Result Value Ref Range Status   SARS Coronavirus 2 by RT PCR NEGATIVE NEGATIVE Final    Comment: (NOTE) SARS-CoV-2 target nucleic acids are NOT DETECTED.  The SARS-CoV-2 RNA is generally detectable in upper respiratory specimens during the acute phase of infection. The lowest concentration of SARS-CoV-2 viral copies this assay can detect is 138 copies/mL. A negative result does not preclude SARS-Cov-2 infection and should not be used as the sole basis for treatment or other patient management decisions. A negative result may occur with  improper specimen collection/handling, submission of specimen other than nasopharyngeal swab, presence of viral mutation(s) within the areas targeted by this assay, and inadequate number of viral copies(<138 copies/mL). A negative result must be combined with clinical observations, patient history, and epidemiological information. The expected result is Negative.  Fact Sheet for Patients:  EntrepreneurPulse.com.au  Fact Sheet for Healthcare Providers:  IncredibleEmployment.be  This test is no t yet approved or cleared by the [place] FDA and  has been authorized for detection and/or diagnosis of SARS-CoV-2 by FDA under an Emergency Use Authorization (EUA). This EUA will remain  in effect (meaning this test can be used) for the duration of the COVID-19 declaration under Section 564(b)(1) of the Act, 21 U.S.C.section 360bbb-3(b)(1), unless the authorization is terminated  or revoked sooner.       Influenza A by PCR NEGATIVE NEGATIVE Final   Influenza B by PCR NEGATIVE NEGATIVE Final    Comment: (NOTE) The Xpert Xpress SARS-CoV-2/FLU/RSV plus assay is intended as an aid in the diagnosis of influenza from Nasopharyngeal swab specimens and should not be used as a sole basis for treatment. Nasal  washings and aspirates are unacceptable for Xpert Xpress SARS-CoV-2/FLU/RSV testing.  Fact Sheet for Patients: EntrepreneurPulse.com.au  Fact Sheet for Healthcare Providers: IncredibleEmployment.be  This test is not yet approved or cleared by the [place] FDA and has been authorized for detection and/or diagnosis of SARS-CoV-2 by FDA under an Emergency Use Authorization (EUA). This EUA will remain in effect (meaning this test can be used) for the duration of the COVID-19 declaration under Section 564(b)(1) of the Act, 21 U.S.C. section 360bbb-3(b)(1), unless the authorization is terminated or revoked.  Performed at [facility], [place] [address], [place], [place] [address]   Culture, blood (routine x 2)     Status: None (Preliminary result)   Collection Time: 01/09/21  1:53 PM   Specimen: BLOOD  Result Value Ref Range Status   Specimen Description   Final    BLOOD RIGHT ANTECUBITAL Performed at [facility] [address] [place], [place] [address]  Special Requests   Final    BOTTLES DRAWN AEROBIC AND ANAEROBIC Blood Culture results may not be optimal due to an inadequate volume of blood received in culture bottles Performed at [facility] [address] [place], [place] [address]    Culture   Final    NO GROWTH < 12 HOURS Performed at [facility] [address] [place], [place] [address]    Report Status PENDING  Incomplete  Resp Panel by RT-PCR (Flu A&B, Covid) Nasopharyngeal Swab     Status: None   Collection Time: 01/09/21  1:53 PM   Specimen: Nasopharyngeal Swab; Nasopharyngeal(NP) swabs in vial transport medium  Result Value Ref Range Status   SARS Coronavirus 2 by RT PCR NEGATIVE NEGATIVE Final    Comment: (NOTE) SARS-CoV-2 target nucleic acids are NOT DETECTED.  The SARS-CoV-2 RNA is generally detectable in upper respiratory specimens during the acute phase of  infection. The lowest concentration of SARS-CoV-2 viral copies this assay can detect is 138 copies/mL. A negative result does not preclude SARS-Cov-2 infection and should not be used as the sole basis for treatment or other patient management decisions. A negative result may occur with  improper specimen collection/handling, submission of specimen other than nasopharyngeal swab, presence of viral mutation(s) within the areas targeted by this assay, and inadequate number of viral copies(<138 copies/mL). A negative result must be combined with clinical observations, patient history, and epidemiological information. The expected result is Negative.  Fact Sheet for Patients:  EntrepreneurPulse.com.au  Fact Sheet for Healthcare Providers:  IncredibleEmployment.be  This test is no t yet approved or cleared by the [place] FDA and  has been authorized for detection and/or diagnosis of SARS-CoV-2 by FDA under an Emergency Use Authorization (EUA). This EUA will remain  in effect (meaning this test can be used) for the duration of the COVID-19 declaration under Section 564(b)(1) of the Act, 21 U.S.C.section 360bbb-3(b)(1), unless the authorization is terminated  or revoked sooner.       Influenza A by PCR NEGATIVE NEGATIVE Final   Influenza B by PCR NEGATIVE NEGATIVE Final    Comment: (NOTE) The Xpert Xpress SARS-CoV-2/FLU/RSV plus assay is intended as an aid in the diagnosis of influenza from Nasopharyngeal swab specimens and should not be used as a sole basis for treatment. Nasal washings and aspirates are unacceptable for Xpert Xpress SARS-CoV-2/FLU/RSV testing.  Fact Sheet for Patients: EntrepreneurPulse.com.au  Fact Sheet for Healthcare Providers: IncredibleEmployment.be  This test is not yet approved or cleared by the [place] FDA and has been authorized for detection and/or diagnosis of SARS-CoV-2  by FDA under an Emergency Use Authorization (EUA). This EUA will remain in effect (meaning this test can be used) for the duration of the COVID-19 declaration under Section 564(b)(1) of the Act, 21 U.S.C. section 360bbb-3(b)(1), unless the authorization is terminated or revoked.  Performed at [facility], [place] [address], [place], [place] [address]   Culture, blood (routine x 2)     Status: None (Preliminary result)   Collection Time: 01/09/21  1:54 PM   Specimen: BLOOD  Result Value Ref Range Status   Specimen Description   Final    BLOOD BLOOD RIGHT FOREARM Performed at [facility] [address], [place], [place] [address]    Special Requests   Final    BOTTLES DRAWN AEROBIC AND ANAEROBIC Blood Culture adequate volume Performed at [facility] [address], [place], [place] [address]  Culture   Final    NO GROWTH < 12 HOURS Performed at [facility], [place] [address], [place], [place] [address]    Report Status PENDING  Incomplete         Radiology Studies: CT Head Wo Contrast  Result Date: 01/09/2021 CLINICAL DATA:  Altered mental status confusion EXAM: CT HEAD WITHOUT CONTRAST TECHNIQUE: Contiguous axial images were obtained from the base of the skull through the vertex without intravenous contrast. COMPARISON:  Brain MRI 08/28/2014, CT head 12/31/2020 FINDINGS: Brain: There is no evidence of acute intracranial hemorrhage, extra-axial fluid collection, or acute infarct. There is unchanged mild global parenchymal volume loss. The ventricles are stable in size. Confluent hypodensity in the subcortical and periventricular white matter is unchanged, likely reflecting sequela of advanced chronic white matter microangiopathy. There is no solid mass lesion.  There is no midline shift. Vascular: There is calcification of the bilateral cavernous ICAs. Skull: Normal. Negative for fracture or focal lesion.  Sinuses/Orbits: There is mild mucosal thickening in the left maxillary sinus. Bilateral lens implants are in place. The globes and orbits are otherwise unremarkable. Other: None. IMPRESSION: 1. No acute intracranial pathology. 2. Unchanged global parenchymal volume loss and chronic white matter microangiopathy. Electronically Signed   By: Valetta Mole M.D.   On: 01/09/2021 14:35   DG Chest Portable 1 View  Result Date: 01/09/2021 CLINICAL DATA:  Altered mental status EXAM: PORTABLE CHEST 1 VIEW COMPARISON:  Chest x-ray 12/31/2020 FINDINGS: Heart is enlarged. Mediastinum appears stable. Calcified plaques in the aortic arch. Right-sided central venous port with the tip in the SVC. Pulmonary vasculature appears within normal limits. No focal consolidation identified. No pleural effusion or pneumothorax. IMPRESSION: Cardiomegaly with no acute process identified. Electronically Signed   By: Ofilia Neas M.D.   On: 01/09/2021 14:36        Scheduled Meds:  amLODipine  10 mg Oral q morning   Chlorhexidine Gluconate Cloth  6 each Topical Daily   enoxaparin (LOVENOX) injection  30 mg Subcutaneous Q24H   gabapentin  100 mg Oral BID   insulin aspart  0-9 Units Subcutaneous TID WC   levETIRAcetam  500 mg Oral BID   memantine  10 mg Oral BID   multivitamin with minerals  1 tablet Oral Daily   pantoprazole  40 mg Oral BID   Continuous Infusions:  sodium chloride 75 mL/hr at 01/09/21 2213     LOS: 0 days        Hosie Poisson, MD [facility]   To contact the attending provider between 7A-7P or the covering provider during after hours 7P-7A, please log into the web site [email or web address] and access using universal [place] password for that web site. If you do not have the password, please call the hospital operator.  01/10/2021, 1:23 PM

## 2021-01-10 NOTE — Evaluation (Signed)
Physical Therapy Evaluation Patient Details Name: [name] MRN: [id number] DOB: 09-16-1940 Today's Date: 01/10/2021  History of Present Illness  80 y.o. F admitted on 01/09/21 due to AMS and increased lethargy.  Pt dx with dehydration, AKI, and metabolic encephalopathy.  PMH significant for Gastric Ulcer (11/17), GI Bleeds, Anemia, HTN, DM2, Seizures.  Clinical Impression  Pt admitted as above and presenting with functional mobility limitations 2* generalized weakness, ambulatory balance deficits and questionable safety awareness.  Pt should progress to dc home with assist of family and would benefit from home health aide to assist with ADL.     Recommendations for follow up therapy are one component of a multi-disciplinary discharge planning process, led by the attending physician.  Recommendations may be updated based on patient status, additional functional criteria and insurance authorization.  Follow Up Recommendations Home health PT    Assistance Recommended at Discharge Frequent or constant Supervision/Assistance ([place] aide to assist with ADL)  Functional Status Assessment Patient has had a recent decline in their functional status and demonstrates the ability to make significant improvements in function in a reasonable and predictable amount of time.  Equipment Recommendations  None recommended by PT    Recommendations for Other Services       Precautions / Restrictions Precautions Precautions: Fall Restrictions Weight Bearing Restrictions: No      Mobility  Bed Mobility Overal bed mobility: Needs Assistance Bed Mobility: Supine to Sit     Supine to sit: Supervision;HOB elevated     General bed mobility comments: Increased time but no physical assist to move supine to sit    Transfers Overall transfer level: Needs assistance Equipment used: Rolling walker (2 wheels) Transfers: Sit to/from Stand Sit to Stand: Min guard           General transfer comment:  Steady assist with cues for use of UEs to self assist    Ambulation/Gait Ambulation/Gait assistance: Min guard Gait Distance (Feet): 70 Feet Assistive device: Rolling walker (2 wheels) Gait Pattern/deviations: Step-through pattern;Decreased stride length;Shuffle;Trunk flexed Gait velocity: Decreased     General Gait Details: cues for posture and position from RW; distance ltd by fatigue  Stairs            Wheelchair Mobility    Modified Rankin (Stroke Patients Only)       Balance Overall balance assessment: Needs assistance Sitting-balance support: No upper extremity supported;Feet supported Sitting balance-Leahy Scale: Good     Standing balance support: Bilateral upper extremity supported Standing balance-Leahy Scale: Poor                               Pertinent Vitals/Pain Pain Assessment: No/denies pain    Home Living Family/patient expects to be discharged to:: Private residence Living Arrangements: Spouse/significant other Available Help at Discharge: Family Type of Home: House Home Access: Ramped entrance       Home Layout: One level Home Equipment: Conservation officer, nature (2 wheels);Shower seat;Rollator (4 wheels);BSC/3in1;Wheelchair - manual Additional Comments: Per pt, she lives with spouse who can assist her "a [place]".  Niece is also supportive but can only provide intermittent assist    Prior Function Prior Level of Function : Needs assist       Physical Assist : Mobility (physical);ADLs (physical) Mobility (physical): Gait   Mobility Comments: Pt reports using a RW at home and all the time in the community but also reports WC is available  Hand Dominance   Dominant Hand: Right    Extremity/Trunk Assessment   Upper Extremity Assessment Upper Extremity Assessment: Defer to OT evaluation    Lower Extremity Assessment Lower Extremity Assessment: Generalized weakness    Cervical / Trunk Assessment Cervical / Trunk  Assessment: Kyphotic  Communication   Communication: No difficulties  Cognition Arousal/Alertness: Awake/alert Behavior During Therapy: WFL for tasks assessed/performed Overall Cognitive Status: No family/caregiver present to determine baseline cognitive functioning                                          General Comments      Exercises     Assessment/Plan    PT Assessment Patient needs continued PT services  PT Problem List Decreased strength;Decreased activity tolerance;Decreased balance;Decreased mobility;Decreased cognition;Decreased knowledge of use of DME;Decreased safety awareness;Decreased knowledge of precautions       PT Treatment Interventions DME instruction;Gait training;Stair training;Functional mobility training;Therapeutic activities;Therapeutic exercise;Balance training;Patient/family education    PT Goals (Current goals can be found in the Care Plan section)  Acute Rehab PT Goals Patient Stated Goal: to go home PT Goal Formulation: With patient Time For Goal Achievement: 01/24/21 Potential to Achieve Goals: Good    Frequency Min 3X/week   Barriers to discharge Decreased caregiver support Spouse is available 24/7 but can offer limited assist    Co-evaluation               AM-PAC PT "6 Clicks" Mobility  Outcome Measure Help needed turning from your back to your side while in a flat bed without using bedrails?: None Help needed moving from lying on your back to sitting on the side of a flat bed without using bedrails?: A Orbach Help needed moving to and from a bed to a chair (including a wheelchair)?: A Carmickle Help needed standing up from a chair using your arms (e.g., wheelchair or bedside chair)?: A Mehlberg Help needed to walk in hospital room?: A [place] Help needed climbing 3-5 steps with a railing? : A [place] 6 Click Score: 19    End of Session Equipment Utilized During Treatment: Gait belt Activity Tolerance: Patient  tolerated treatment well;Patient limited by fatigue Patient left: in chair;with call bell/phone within reach;with chair alarm set Nurse Communication: Mobility status PT Visit Diagnosis: Muscle weakness (generalized) (M62.81);Difficulty in walking, not elsewhere classified (R26.2);Unsteadiness on feet (R26.81)    Time: 1031-1100 PT Time Calculation (min) (ACUTE ONLY): 29 min   Charges:   PT Evaluation $PT Eval Low Complexity: 1 Low          Charles Mix Pager [phone] Office [phone]   Gram Siedlecki 01/10/2021, 12:22 PM

## 2021-01-10 NOTE — Telephone Encounter (Signed)
Please see CM's note from 12/1. They will be setting up DME and [place] prior to d/c. Husband made aware. He knows to contact Nurse Suanne Marker, [place] for any questions.

## 2021-01-11 DIAGNOSIS — G9341 Metabolic encephalopathy: Secondary | ICD-10-CM | POA: Diagnosis not present

## 2021-01-11 DIAGNOSIS — Z515 Encounter for palliative care: Secondary | ICD-10-CM

## 2021-01-11 DIAGNOSIS — E860 Dehydration: Secondary | ICD-10-CM | POA: Diagnosis not present

## 2021-01-11 DIAGNOSIS — N179 Acute kidney failure, unspecified: Secondary | ICD-10-CM | POA: Diagnosis not present

## 2021-01-11 DIAGNOSIS — Z7189 Other specified counseling: Secondary | ICD-10-CM

## 2021-01-11 LAB — GLUCOSE, CAPILLARY
Glucose-Capillary: 182 mg/dL — ABNORMAL HIGH (ref 70–99)
Glucose-Capillary: 191 mg/dL — ABNORMAL HIGH (ref 70–99)
Glucose-Capillary: 282 mg/dL — ABNORMAL HIGH (ref 70–99)
Glucose-Capillary: 255 mg/dL — ABNORMAL HIGH (ref 70–99)

## 2021-01-11 LAB — BASIC METABOLIC PANEL
Anion gap: 8 (ref 5–15)
BUN: 16 mg/dL (ref 8–23)
CO2: 20 mmol/L — ABNORMAL LOW (ref 22–32)
Calcium: 8.3 mg/dL — ABNORMAL LOW (ref 8.9–10.3)
Chloride: 110 mmol/L (ref 98–111)
Creatinine, Ser: 1.43 mg/dL — ABNORMAL HIGH (ref 0.44–1.00)
GFR, Estimated: 37 mL/min — ABNORMAL LOW (ref >60)
Glucose, Bld: 237 mg/dL — ABNORMAL HIGH (ref 70–99)
Potassium: 4.5 mmol/L (ref 3.5–5.1)
Sodium: 138 mmol/L (ref 135–145)

## 2021-01-11 MED ORDER — INSULIN ASPART 100 UNIT/ML IJ SOLN
2.0000 [IU] | Freq: Three times a day (TID) | INTRAMUSCULAR | Status: DC
  Administered 2021-01-14 (×2): 2 [IU] via SUBCUTANEOUS

## 2021-01-11 MED ORDER — INSULIN ASPART PROT & ASPART (70-30 MIX) 100 UNIT/ML ~~LOC~~ SUSP
6.0000 [IU] | Freq: Two times a day (BID) | SUBCUTANEOUS | Status: DC
  Administered 2021-01-11 – 2021-01-15 (×9): 6 [IU] via SUBCUTANEOUS
  Filled 2021-01-11: qty 10

## 2021-01-11 NOTE — Progress Notes (Signed)
[facility] Liaison Note  Notified by [facility] of patient/family request of [facility] Paliative services.[facility] hospital liaison will follow patient for discharge disposition.   Please call with any questions/concerns.    Thank you for the opportunity to participate in this patient's care.   Daphene Calamity, MSW [facility] Liaison  [phone]

## 2021-01-11 NOTE — Progress Notes (Signed)
PROGRESS NOTE    [name]  RWE:[id number] DOB: [birth year] DOA: 01/09/2021 PCP: Mike Craze, DO    Chief Complaint  Patient presents with   Failure To Thrive    Brief Narrative:   [name] is a 80 y.o. 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 As per god daughter, this is the second episode where she becomes unresponsive / lethargic after not drinking for two days. Then she comes to ED, gets IV fluids and perks up and is back to baseline.   Assessment & Plan:   Principal Problem:   Acute metabolic encephalopathy   Acute metabolic encephalopathy in the setting of baseline dementia: Probably secondary to AKI/dehydration. She is back to baseline but has recent memory impairment.  Differentials include seizures. Initial CT of the head is negative for acute intracranial abnormality MRI of the brain without contrast ordered for further evaluation.  Since this is the second episode we will get EEG. EEG does not show any epileptiform activity.  Vitamin b123 is 450. TSH wnl.   History of GI bleed  secondary to gastric AV malformations Continue with PPI.    AKI on stage IIIa CKD Improved with IV fluids. Creatinine is 1.4, improving. Recommend to watch her off IV fluids .    Type 2 diabetes mellitus Last A!c is 7.8.  Continue with SSI.  Will restart her home meds.  CBG (last 3)  Recent Labs    01/10/21 1634 01/10/21 2138 01/11/21 0723  GLUCAP 256* 316* 182*   Added novolog 2 units TIDAC.    Dementia: Continue with Namenda.    Hypertension:  Well controlled.  Resume amlodipine.  Stop the Benicar.    H/o seizures: Continue with keppra  Keppra level ordered.    Diabetic neuropathy:  - continue with Gabapentin.    In view of her advanced age and multiple medical  issues and readmissions, will request palliative care for goals of care.    DVT prophylaxis: (Lovenox) Code Status: (Full code) Family Communication: none at bedside.  Disposition:   Status is: Observation  The patient will require care spanning > 2 midnights and should be moved to inpatient because: unsafe d/c plan.       Consultants:  Palliative care.   Procedures: none.   Antimicrobials:NONE.    Subjective: NO CHEST PAIN or sob, no nausea, vomiting.   Objective: Vitals:   01/10/21 0945 01/10/21 1355 01/10/21 2125 01/11/21 0544  BP: 119/66 (!) 128/52 (!) 137/47 (!) 125/49  Pulse: 66 85 (!) 52 75  Resp:  14 15 15   Temp:  97.6 F (36.4 C) 98.5 F (36.9 C) 98.7 F (37.1 C)  TempSrc:  Oral Oral Oral  SpO2:  97% 100% 98%  Weight:      Height:        Intake/Output Summary (Last 24 hours) at 01/11/2021 0849 Last data filed at 01/11/2021 0600 Gross per 24 hour  Intake 2004.28 ml  Output 1320 ml  Net 684.28 ml    Filed Weights   01/09/21 1347  Weight: 64.2 kg    Examination: General exam: Appears calm and comfortable  Respiratory system: Clear to auscultation. Respiratory effort normal. Cardiovascular system: S1 & S2 heard, RRR. No JVD,  No pedal edema. Gastrointestinal system: Abdomen is nondistended, soft and nontender. . Normal bowel sounds heard. Central nervous system: Alert  and oriented to person and place only. . No focal neurological deficits. Extremities: no pedal edema.  Skin: No rashes, lesions or ulcers Psychiatry: Mood & affect appropriate.      Data Reviewed: I have personally reviewed following labs and imaging studies  CBC: Recent Labs  Lab 01/09/21 1353 01/10/21 0436  WBC 6.0 4.7  NEUTROABS 3.4  --   HGB 14.3 11.2*  HCT 43.3 33.4*  MCV 92.1 89.5  PLT 158 128*     Basic Metabolic Panel: Recent Labs  Lab 01/09/21 1353 01/10/21 0436 01/11/21 0240  NA 141 143 138  K 4.8 4.1 4.5  CL 111 114* 110  CO2 19* 23 20*  GLUCOSE  192* 243* 237*  BUN 24* 20 16  CREATININE 2.00* 1.63* 1.43*  CALCIUM 9.5 8.6* 8.3*     GFR: Estimated Creatinine Clearance: 28.3 mL/min (A) (by C-G formula based on SCr of 1.43 mg/dL (H)).  Liver Function Tests: Recent Labs  Lab 01/09/21 1353  AST 24  ALT 17  ALKPHOS 128*  BILITOT 1.0  PROT 7.3  ALBUMIN 3.8     CBG: Recent Labs  Lab 01/10/21 0722 01/10/21 1155 01/10/21 1634 01/10/21 2138 01/11/21 0723  GLUCAP 146* 257* 256* 316* 182*      Recent Results (from the past 240 hour(s))  Urine Culture     Status: None   Collection Time: 01/09/21  1:49 PM   Specimen: In/Out Cath Urine  Result Value Ref Range Status   Specimen Description   Final    IN/OUT CATH URINE Performed at [facility], [place] [address], [place], [place] [address]    Special Requests   Final    NONE Performed at [facility], [place] [address], [place], [place] [address]    Culture   Final    NO GROWTH Performed at [facility], [place] [address] [place], [place] [address]    Report Status 01/10/2021 FINAL  Final  Culture, blood (routine x 2)     Status: None (Preliminary result)   Collection Time: 01/09/21  1:53 PM   Specimen: BLOOD  Result Value Ref Range Status   Specimen Description   Final    BLOOD RIGHT ANTECUBITAL Performed at [facility] [address], [place], [place] [address]    Special Requests   Final    BOTTLES DRAWN AEROBIC AND ANAEROBIC Blood Culture results may not be optimal due to an inadequate volume of blood received in culture bottles Performed at [facility] [address], [place], [place] [address]    Culture   Final    NO GROWTH 2 DAYS Performed at [facility] [address] [place], [place] [address]    Report Status PENDING  Incomplete  Resp Panel by RT-PCR (Flu A&B, Covid) Nasopharyngeal Swab     Status: None   Collection Time: 01/09/21  1:53 PM   Specimen:  Nasopharyngeal Swab; Nasopharyngeal(NP) swabs in vial transport medium  Result Value Ref Range Status   SARS Coronavirus 2 by RT PCR NEGATIVE NEGATIVE Final    Comment: (NOTE) SARS-CoV-2 target nucleic acids are NOT DETECTED.  The SARS-CoV-2 RNA is generally detectable in upper respiratory specimens during the acute phase of infection. The lowest concentration of SARS-CoV-2 viral copies this assay can detect is 138 copies/mL. A negative result does not preclude SARS-Cov-2 infection and should not be used as the sole basis for treatment or other patient management decisions. A  negative result may occur with  improper specimen collection/handling, submission of specimen other than nasopharyngeal swab, presence of viral mutation(s) within the areas targeted by this assay, and inadequate number of viral copies(<138 copies/mL). A negative result must be combined with clinical observations, patient history, and epidemiological information. The expected result is Negative.  Fact Sheet for Patients:  EntrepreneurPulse.com.au  Fact Sheet for Healthcare Providers:  IncredibleEmployment.be  This test is no t yet approved or cleared by the [place] FDA and  has been authorized for detection and/or diagnosis of SARS-CoV-2 by FDA under an Emergency Use Authorization (EUA). This EUA will remain  in effect (meaning this test can be used) for the duration of the COVID-19 declaration under Section 564(b)(1) of the Act, 21 U.S.C.section 360bbb-3(b)(1), unless the authorization is terminated  or revoked sooner.       Influenza A by PCR NEGATIVE NEGATIVE Final   Influenza B by PCR NEGATIVE NEGATIVE Final    Comment: (NOTE) The Xpert Xpress SARS-CoV-2/FLU/RSV plus assay is intended as an aid in the diagnosis of influenza from Nasopharyngeal swab specimens and should not be used as a sole basis for treatment. Nasal washings and aspirates are unacceptable for  Xpert Xpress SARS-CoV-2/FLU/RSV testing.  Fact Sheet for Patients: EntrepreneurPulse.com.au  Fact Sheet for Healthcare Providers: IncredibleEmployment.be  This test is not yet approved or cleared by the [place] FDA and has been authorized for detection and/or diagnosis of SARS-CoV-2 by FDA under an Emergency Use Authorization (EUA). This EUA will remain in effect (meaning this test can be used) for the duration of the COVID-19 declaration under Section 564(b)(1) of the Act, 21 U.S.C. section 360bbb-3(b)(1), unless the authorization is terminated or revoked.  Performed at [facility], [place] [address] [place], [place] [address]   Culture, blood (routine x 2)     Status: None (Preliminary result)   Collection Time: 01/09/21  1:54 PM   Specimen: BLOOD  Result Value Ref Range Status   Specimen Description   Final    BLOOD BLOOD RIGHT FOREARM Performed at [facility] [address] [place], [place] [address]    Special Requests   Final    BOTTLES DRAWN AEROBIC AND ANAEROBIC Blood Culture adequate volume Performed at [facility] [address], [place], [place] [address]    Culture   Final    NO GROWTH 2 DAYS Performed at [facility] [address] [place], [place] [address]    Report Status PENDING  Incomplete          Radiology Studies: CT Head Wo Contrast  Result Date: 01/09/2021 CLINICAL DATA:  Altered mental status confusion EXAM: CT HEAD WITHOUT CONTRAST TECHNIQUE: Contiguous axial images were obtained from the base of the skull through the vertex without intravenous contrast. COMPARISON:  Brain MRI 08/28/2014, CT head 12/31/2020 FINDINGS: Brain: There is no evidence of acute intracranial hemorrhage, extra-axial fluid collection, or acute infarct. There is unchanged mild global parenchymal volume loss. The ventricles are stable in size. Confluent hypodensity  in the subcortical and periventricular white matter is unchanged, likely reflecting sequela of advanced chronic white matter microangiopathy. There is no solid mass lesion.  There is no midline shift. Vascular: There is calcification of the bilateral cavernous ICAs. Skull: Normal. Negative for fracture or focal lesion. Sinuses/Orbits: There is mild mucosal thickening in the left maxillary sinus. Bilateral lens implants are in place. The globes and orbits are otherwise unremarkable. Other: None. IMPRESSION: 1. No acute  intracranial pathology. 2. Unchanged global parenchymal volume loss and chronic white matter microangiopathy. Electronically Signed   By: Valetta Mole M.D.   On: 01/09/2021 14:35   DG Chest Portable 1 View  Result Date: 01/09/2021 CLINICAL DATA:  Altered mental status EXAM: PORTABLE CHEST 1 VIEW COMPARISON:  Chest x-ray 12/31/2020 FINDINGS: Heart is enlarged. Mediastinum appears stable. Calcified plaques in the aortic arch. Right-sided central venous port with the tip in the SVC. Pulmonary vasculature appears within normal limits. No focal consolidation identified. No pleural effusion or pneumothorax. IMPRESSION: Cardiomegaly with no acute process identified. Electronically Signed   By: Ofilia Neas M.D.   On: 01/09/2021 14:36   EEG adult  Result Date: 01/10/2021 Lora Havens, MD     01/11/2021  8:15 AM Patient Name: [name] MRN: [id number] Epilepsy Attending: Lora Havens Referring Physician/Provider: Dr Arline Asp Date: 01/10/2021 Duration: 21.40 mins Patient history: 80 year old female with history of seizures and dementia now with altered mental status. EEG to evaluate for seizures. Level of alertness: Awake AEDs during EEG study: Keppra, gabapentin Technical aspects: This EEG study was done with scalp electrodes positioned according to the 10-20 International system of electrode placement. Electrical activity was acquired at a sampling rate of 500Hz  and reviewed with a  high frequency filter of 70Hz  and a low frequency filter of 1Hz . EEG data were recorded continuously and digitally stored. Description: The posterior dominant rhythm consists of 8 Hz activity of moderate voltage (25-35 uV) seen predominantly in posterior head regions, symmetric and reactive to eye opening and eye closing.  Hyperventilation and photic stimulation were not performed.   IMPRESSION: This study is within normal limits. No seizures or epileptiform discharges were seen throughout the recording. Priyanka Barbra Sarks        Scheduled Meds:  amLODipine  10 mg Oral q morning   Chlorhexidine Gluconate Cloth  6 each Topical Daily   enoxaparin (LOVENOX) injection  30 mg Subcutaneous Q24H   gabapentin  100 mg Oral BID   insulin aspart  0-9 Units Subcutaneous TID WC   levETIRAcetam  500 mg Oral BID   memantine  10 mg Oral BID   multivitamin with minerals  1 tablet Oral Daily   pantoprazole  40 mg Oral BID   Continuous Infusions:     LOS: 0 days        Hosie Poisson, MD [facility]   To contact the attending provider between 7A-7P or the covering provider during after hours 7P-7A, please log into the web site [email or web address] and access using universal [place] password for that web site. If you do not have the password, please call the hospital operator.  01/11/2021, 8:49 AM

## 2021-01-11 NOTE — Progress Notes (Signed)
Mobility Specialist - Progress Note    01/11/21 1058  Mobility  Activity Ambulated in hall  Level of Assistance Contact guard assist, steadying assist  Assistive Device Front wheel walker  Distance Ambulated (ft) 400 ft  Mobility Ambulated with assistance in hallway  Mobility Response Tolerated well  Mobility performed by Mobility specialist  $Mobility charge 1 Mobility   Upon entry pt was agreeable to mobilize and did not require A to sit EOB. Pt used RW while ambulating 400 ft in hallway. On return trip, pt expressed feeling pain in R leg and rating it 5/10. Pt was then left in recliner with call bell at side and chair alarm on.   [place] Specialist Acute Rehabilitation Services Phone: [phone] 01/11/21, 11:01 AM

## 2021-01-11 NOTE — TOC Transition Note (Incomplete)
Transition of Care [facility]) - CM/SW Discharge Note   Patient Details  Name: [name] MRN: [id number] Date of Birth: [birth year]  [facility]) CM/SW Contact:  Ross Ludwig, LCSW Phone Number: 01/11/2021, 3:47 PM   Clinical Narrative:    Lawerance Bach, said that patient and her husband stated they are not happy with the Medicaid private duty care providers.  CSW informed her that they need to talk with the private duty care agency and the Medicaid worker to discuss trying to find a different agency.  CSW spoke to patient's family member Tabitha, and informed her that home health has been set up with [facility].  Patient's family member also requested that palliative follow outpatient, CSW spoke to [facility], who agreed to accept patient.  CSW also informed her that the hospital bed should be delivered today.  Patient will be going home with home health PT, RN, and soc work through [place].  CSW signing off please reconsult with any other social work needs, home health agency has been notified of planned discharge.    Final next level of care: [place] Barriers to Discharge: Barriers Resolved   Patient Goals and CMS Choice Patient states their goals for this hospitalization and ongoing recovery are:: To return back home with home health. CMS Medicare.gov Compare Post Acute Care list provided to:: Patient Choice offered to / list presented to : Patient  Discharge Placement                       Discharge Plan and Services                DME Arranged: Hospital bed DME Agency: [employer] Date DME Agency Contacted: 01/11/21 Time DME Agency Contacted: [phone] Representative spoke with at DME Agency: [place]: PT, RN, Social Work [employer] Agency: [place] Date [place]: 01/11/21 Time [place]: 49 Representative spoke with at [place]: [place] Interventions      Readmission Risk Interventions No flowsheet data found.

## 2021-01-11 NOTE — TOC CM/SW Note (Signed)
CHF or Chronic Pulmonary Condition  Patient suffers from CHF and has trouble breathing at night when head is elevated less than 30 degrees.  Bed wedges do not provide enough elevation to resolve breathing issues.  Difficulty Breathing cause patient to require frequent changes in body position which cannot be achieved with a normal bed.  [name]. Yul Diana, MSW, LCSW [phone]  01/11/2021 11:00 AM

## 2021-01-11 NOTE — Consult Note (Signed)
Palliative Care Consult Note                                  Date: 01/11/2021   Patient Name: [name]  DOB: 1940/11/16  MRN: [id number]  Age / Sex: 80 y.o., female  PCP: Mike Craze, DO Referring Physician: Hosie Poisson, MD  Reason for Consultation: Establishing goals of care  HPI/Patient Profile: Palliative Care consult requested for goals of care discussion in this 80 y.o. 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 She was admitted on 01/09/2021 from home with confusion. Patient recently seen in ED for similar concerns of dehydration poor appetite in which she was given IV fluids and discharged back home. Chest x-ray and CT of head showed no acute abnormalities.   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     Subjective:   This NP NiOsborne Omaneviewed medical records, received  report from team, assessed the patient and then met at the patient's bedside and spoke with her husband, [name] by phone to discuss diagnosis, prognosis, GOC, EOL wishes disposition and options.  During my assessment, Mrs. LiJanuaryas awake and alert. She was able to appropriate state her name and date-of-birth. When asked location she was unsure but states she knows it is not her home and is asking to return home today. Oriented that she was in the hospital and she replied "again!"    Concept of Palliative Care was introduced as specialized medical care for people and their families living with serious illness.  It focuses on providing relief from the symptoms and stress of a serious illness.  The goal is to improve quality of life for both the patient and the family. Values and goals of care important to patient and family were attempted to be elicited.  I created space and opportunity for patient and family to explore state of health prior to admission, thoughts, and feelings. BeCrystalynas able to provide some personal history which was confirmed with her husband to be accurate. She shares she lives in the home with her husband and has home health coming in throughout the week. She states she is not happy with home health because they "do not do  anything". [name] confirms this information with same feelings.   He shares prior to admission patient was ambulatory in the home with walker assistance. She is able to perform most ADLs with some assistance. Has no concerns with taking medications as he and their Goddaughter, [name] and niece, [name] assist in keeping them organized. Legacy's appetite is good majority of the time but husband expresses occasionally she will not show interest in eating and begin to sleep more until she becomes dehydrated.   We discussed Her current illness and what it means in the larger context of Her on-going co-morbidities specifically to dementia, advanced, age, and  overall functional and nutritional status. Natural disease trajectory and expectations were discussed.   [name] verbalized understanding of current illness and co-morbidities. He emphasizes family feels patient is doing ok except when she has "these episodes!".   Detailed discussion regarding recurrent hospitalization and how this takes a toll on patient's body and ability to recover after each visit in addition to nutrition. Mr. [name] verbalized understanding expressing she would not need to come back and forth to the hospital if she could receive home IVF when needed. Extensive education on the use of IVF in correlation with quality of life and medical management. Explained it is not a typical for a patient to receive IVF in the home and the bigger picture would be to determine and understand causes work to manage or with clear understanding this will continue to be a cycle given co-morbidities.   I discussed the importance of continued conversation with family and their medical providers regarding overall plan of care and treatment options, ensuring decisions are within the context of the patients values and GOCs.  Questions and concerns were addressed. The family was encouraged to call with questions or concerns.  PMT will continue to support holistically as needed.  Life Review: Patient lives in the home with her husband of more than 20 years. They do not have any biological children, however they do have a Goddaughter-[name] who is like a daughter to them. 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 Christian faith.    Objective:   Primary Diagnoses: Present on Admission:  Acute metabolic encephalopathy   Scheduled Meds:  amLODipine  10 mg Oral q morning   Chlorhexidine Gluconate Cloth  6 each Topical Daily   enoxaparin (LOVENOX) injection  30 mg Subcutaneous Q24H   gabapentin  100 mg Oral BID   insulin aspart  0-9 Units Subcutaneous TID WC   insulin aspart  2 Units  Subcutaneous TID WC   insulin aspart protamine- aspart  6 Units Subcutaneous BID WC   levETIRAcetam  500 mg Oral BID   memantine  10 mg Oral BID   multivitamin with minerals  1 tablet Oral Daily   pantoprazole  40 mg Oral BID    Continuous Infusions:   PRN Meds: sodium chloride flush, triamcinolone ointment  Allergies  Allergen Reactions   Aspirin Nausea And Vomiting    Review of Systems  Neurological:  Positive for weakness.  Unless otherwise noted, a complete review of systems is negative.  Physical Exam General: NAD, fchronically-ill appearing Cardiovascular: regular rate and rhythm Pulmonary: clear ant fields Abdomen: soft, nontender, + bowel sounds Extremities: bilateral pedal edema, moves all extremities  Skin: no rashes, warm and dry Neurological: Patient is alert to self, year, and some family history. Follows commands   Vital Signs:  BP (!) 125/49 (BP Location: Right Arm)   Pulse 75   Temp  98.7 F (37.1 C) (Oral)   Resp 15   Ht _0  (1.6 m)   Wt 64.2 kg   LMP  (LMP Unknown)   SpO2 98%   BMI 25.07 kg/m  Pain Scale: 0-10   Pain Score: 0-No pain  SpO2: SpO2: 98 % O2 Device:SpO2: 98 % O2 Flow Rate: .   IO: Intake/output summary:  Intake/Output Summary (Last 24 hours) at 01/11/2021 1013 Last data filed at 01/11/2021 0600 Gross per 24 hour  Intake 1604.28 ml  Output 1200 ml  Net 404.28 ml    LBM: Last BM Date: 01/09/21 Baseline Weight: Weight: 64.2 kg Most recent weight: Weight: 64.2 kg      Palliative Assessment/Data: PPS 30%   Advanced Care Planning:   Primary Decision Maker: NEXT OF KIN  Code Status/Advance Care Planning: Full code  A discussion was had today regarding advanced directives. Concepts specific to code status, artifical feeding and hydration, continued IV antibiotics and rehospitalization was had.    [name] states he and his wife do not have an advanced directive. He shares he relies on support with medical decisions  from his niece and their Goddaughter. Education provided on legal decision maker in the setting of no legal advanced directives. Husband states he would want to pursue all aggressive interventions and if patient was unable to eat or showed no interest he would consider a temporary feeding tube/PEG.   I discussed at length her full code status with consideration of her current illness and co-morbidities. [name] verbalized understanding however states he and family would want to allow patient every opportunity and if no improvement or ability to thrive after efforts he would then consider a more comfort focused care. Encouraged ongoing family discussions.   Hospice and Palliative Care services outpatient were explained and offered.[name] verbalized understanding and awareness of both palliative and hospice's goals and philosophy of care. He states he is not interested in pursuing hospice however family would be open to palliative support.    Assessment & Plan:   SUMMARY OF RECOMMENDATIONS   Full Code-as confirmed by husband. Highly encouraged ongoing discussions and consideration for DNR/DNI Continue with current plan of care. [name] is clear in expressed wishes to continue to treat the treatable with a goal of allowing patient every opportunity to continue to thrive.  Education provided on disease trajectory and realistic expectations of care needs in the home.  PMT will continue to support and follow as needed. Please call team line with urgent needs. I will be off service on Sunday returning on Monday.   Palliative Prophylaxis:  Aspiration, Bowel Regimen, Delirium Protocol, Frequent Pain Assessment, and Oral Care  Additional Recommendations (Limitations, Scope, Preferences): Full Scope Treatment  Psycho-social/Spiritual:  Desire for further Chaplaincy support: no Additional Recommendations: Education on Hospice and ongoing goals of care discussion  Prognosis:  Guarded-Poor    Discharge Planning:  To Be Determined -Husband expresses goal is to return home with home health support.  Discussed with: [name].   Patient's husband expressed understanding and was in agreement with this plan.   Time In: 0905 Time Out: 1000 Time Total: 55 min.   Visit consisted of counseling and education dealing with the complex and emotionally intense issues of symptom management and palliative care in the setting of serious and potentially life-threatening illness.Greater than 50%  of this time was spent counseling and coordinating care related to the above assessment and plan.  Signed by:  Alda Lea, AGPCNP-BC Palliative Medicine  Team  Phone: [phone] Pager: [phone] Amion: Bjorn Pippin   Thank you for allowing the Palliative Medicine Team to assist in the care of this patient. Please utilize secure chat with additional questions, if there is no response within 30 minutes please call the above phone number. Palliative Medicine Team providers are available by phone from 7am to 5pm daily and can be reached through the team cell phone.  Should this patient require assistance outside of these hours, please call the patient's attending physician.

## 2021-01-12 ENCOUNTER — Observation Stay (HOSPITAL_COMMUNITY): Payer: Medicare Other

## 2021-01-12 DIAGNOSIS — G9341 Metabolic encephalopathy: Secondary | ICD-10-CM | POA: Diagnosis not present

## 2021-01-12 DIAGNOSIS — N179 Acute kidney failure, unspecified: Secondary | ICD-10-CM | POA: Diagnosis not present

## 2021-01-12 DIAGNOSIS — E860 Dehydration: Secondary | ICD-10-CM | POA: Diagnosis not present

## 2021-01-12 LAB — BASIC METABOLIC PANEL
Anion gap: 5 (ref 5–15)
BUN: 16 mg/dL (ref 8–23)
CO2: 24 mmol/L (ref 22–32)
Calcium: 9.1 mg/dL (ref 8.9–10.3)
Chloride: 109 mmol/L (ref 98–111)
Creatinine, Ser: 1.46 mg/dL — ABNORMAL HIGH (ref 0.44–1.00)
GFR, Estimated: 36 mL/min — ABNORMAL LOW (ref >60)
Glucose, Bld: 301 mg/dL — ABNORMAL HIGH (ref 70–99)
Potassium: 4.5 mmol/L (ref 3.5–5.1)
Sodium: 138 mmol/L (ref 135–145)

## 2021-01-12 LAB — CBC WITH DIFFERENTIAL/PLATELET
Abs Immature Granulocytes: 0.02 10*3/uL (ref 0.00–0.07)
Basophils Absolute: 0 10*3/uL (ref 0.0–0.1)
Basophils Relative: 1 %
Eosinophils Absolute: 0.4 10*3/uL (ref 0.0–0.5)
Eosinophils Relative: 8 %
HCT: 35.9 % — ABNORMAL LOW (ref 36.0–46.0)
Hemoglobin: 12.4 g/dL (ref 12.0–15.0)
Immature Granulocytes: 0 %
Lymphocytes Relative: 18 %
Lymphs Abs: 1 10*3/uL (ref 0.7–4.0)
MCH: 30.9 pg (ref 26.0–34.0)
MCHC: 34.5 g/dL (ref 30.0–36.0)
MCV: 89.5 fL (ref 80.0–100.0)
Monocytes Absolute: 0.5 10*3/uL (ref 0.1–1.0)
Monocytes Relative: 9 %
Neutro Abs: 3.6 10*3/uL (ref 1.7–7.7)
Neutrophils Relative %: 64 %
Platelets: 152 10*3/uL (ref 150–400)
RBC: 4.01 MIL/uL (ref 3.87–5.11)
RDW: 13.8 % (ref 11.5–15.5)
WBC: 5.5 10*3/uL (ref 4.0–10.5)
nRBC: 0 % (ref 0.0–0.2)

## 2021-01-12 LAB — GLUCOSE, CAPILLARY
Glucose-Capillary: 126 mg/dL — ABNORMAL HIGH (ref 70–99)
Glucose-Capillary: 183 mg/dL — ABNORMAL HIGH (ref 70–99)
Glucose-Capillary: 227 mg/dL — ABNORMAL HIGH (ref 70–99)
Glucose-Capillary: 344 mg/dL — ABNORMAL HIGH (ref 70–99)

## 2021-01-12 NOTE — TOC Progression Note (Addendum)
Transition of Care [facility]) - Progression Note    Patient Details  Name: [name] MRN: [id number] Date of Birth: 19-Feb-1940  [facility] CM/SW Contact  Ross Ludwig, [place] Phone Number: 01/12/2021, 4:48 PM  Clinical Narrative:     CSW spoke to patient's niece [name], [phone], said that patient and her husband stated they are not happy with the Medicaid private duty care providers.  CSW informed her that they need to talk with the private duty care agency and the Medicaid worker to discuss trying to find a different agency.  CSW spoke to patient's niece, and informed her that home health has been set up with [facility]  Patient's family member also requested that palliative follow outpatient, CSW spoke to [facility], who agreed to accept patient.  CSW also informed that the hospital bed was delivered today.  CSW also explained to patient 's niece that home health will not be there every day, and the home health agency will assess patient to determine how often patient can be seen.  She expressed understanding.    Patient will be going home with home health PT, RN, and soc work through [place], [place] notified Alvis Lemmings to contact the niece as the main contact.  Per attending physician patient not medically ready for discharge yet.        Barriers to Discharge: Barriers Resolved  Expected Discharge Plan and Services                           DME Arranged: Hospital bed DME Agency: [employer] Date DME Agency Contacted: 01/11/21 Time DME Agency Contacted: [phone] Representative spoke with at DME Agency: [place]: PT, RN, Social Work [employer] Agency: [place] Date [place]: 01/11/21 Time [place]: 48 Representative spoke with at [place]: [place] ([place]) Interventions    Readmission Risk Interventions No flowsheet data found.

## 2021-01-12 NOTE — Progress Notes (Addendum)
PROGRESS NOTE    [name]  RAX:[id number] DOB: 1941-02-08 DOA: 01/09/2021 PCP: Mike Craze, DO    Chief Complaint  Patient presents with   Failure To Thrive    Brief Narrative:   [name] is a 80 y.o. 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 As per god daughter, this is the second episode where she becomes unresponsive / lethargic after not drinking for two days. Then she comes to ED, gets IV fluids and perks up and is back to baseline.  Pt seen and examined at bedside. Discussed with Dr Cheral Marker about the episodes, and work up so far has been negative.  Dr Cheral Marker recommended since we were not able to elicit her symptoms/ did not have any episodes of confusion or unresponsive ness in patient, suggested outpatient ambulatory EEG monitoring vs holter monitor as outpatient.  Discussed the results with the family, the god daughter , the husband and Denice over the phone. The husband reported that he cannot take her home and care for her. He is exhausted himself. They wanted to talk to the Social worker and see if his wife can go to long term or SNF.   Assessment & Plan:   Principal Problem:   Acute metabolic encephalopathy   Acute metabolic encephalopathy in the setting of baseline dementia: Probably secondary to AKI/dehydration. She is back to baseline but has recent memory impairment.  Differentials include seizures. Initial CT of the head is negative for acute intracranial abnormality MRI of the brain without contrast ordered for further evaluation.  Since this is the second episode we will get EEG. EEG does not show any epileptiform activity.  Vitamin b12 is 450. TSH wnl.   History of GI bleed  secondary to gastric AV malformations Continue with PPI.    AKI on stage IIIa CKD Improved  with IV fluids. Creatinine is 1.4, improving. Recommend to watch her off IV fluids .     Type 2 diabetes mellitus Last A1c is 7.8.  Continue with SSI.  Will restart her home meds.  CBG (last 3)  Recent Labs    01/11/21 2059 01/12/21 0742 01/12/21 1155  GLUCAP 255* 126* 183*    Increase to 3 units TIDAC.    Dementia: Without any behavioral abnormalities.  Continue with Namenda.    Hypertension:  Well controlled.  Resume amlodipine.  Stop the Benicar.    H/o seizures: Continue with keppra EEG does not show any epileptiform activity.  Keppra level ordered.    Diabetic neuropathy:  - continue with Gabapentin.   Mild thrombocytopenia:     In view of her advanced age and multiple medical issues and readmissions, will request palliative care for goals of care.    DVT prophylaxis: (Lovenox) Code Status: (Full code) Family Communication: none at bedside.  Disposition:   Status is: Observation  The patient will require care spanning > 2 midnights and should be moved to inpatient because: unsafe d/c plan.       Consultants:  Palliative care.   Procedures: MRI brain without contrast.   Antimicrobials:NONE.    Subjective: No new complaints.   Objective: Vitals:   01/11/21 1402 01/11/21 2056 01/12/21 0451 01/12/21 1340  BP: (!) 122/53 (!) 127/47 (!) 112/43 (!) 136/55  Pulse: (!) 51 (!) 52 84 (!) 51  Resp: 18 15 18 18   Temp:  98.4 F (36.9 C) 97.9 F (36.6 C) 98.7 F (37.1 C) 97.8 F (36.6 C)  TempSrc: Oral Oral Oral Oral  SpO2: 100% 100% 96% 100%  Weight:      Height:        Intake/Output Summary (Last 24 hours) at 01/12/2021 1418 Last data filed at 01/12/2021 0600 Gross per 24 hour  Intake --  Output 600 ml  Net -600 ml    Filed Weights   01/09/21 1347  Weight: 64.2 kg    Examination: General exam: Appears calm and comfortable  Respiratory system: Clear to auscultation. Respiratory effort normal. Cardiovascular system: S1 & S2  heard, RRR. No JVD, No pedal edema. Gastrointestinal system: Abdomen is nondistended, soft and nontender.  Normal bowel sounds heard. Central nervous system: Alert and oriented to person only. No focal neurological deficits. Extremities: Symmetric 5 x 5 power. Skin: No rashes, lesions or ulcers Psychiatry:  Mood & affect appropriate.       Data Reviewed: I have personally reviewed following labs and imaging studies  CBC: Recent Labs  Lab 01/09/21 1353 01/10/21 0436  WBC 6.0 4.7  NEUTROABS 3.4  --   HGB 14.3 11.2*  HCT 43.3 33.4*  MCV 92.1 89.5  PLT 158 128*     Basic Metabolic Panel: Recent Labs  Lab 01/09/21 1353 01/10/21 0436 01/11/21 0240  NA 141 143 138  K 4.8 4.1 4.5  CL 111 114* 110  CO2 19* 23 20*  GLUCOSE 192* 243* 237*  BUN 24* 20 16  CREATININE 2.00* 1.63* 1.43*  CALCIUM 9.5 8.6* 8.3*     GFR: Estimated Creatinine Clearance: 28.3 mL/min (A) (by C-G formula based on SCr of 1.43 mg/dL (H)).  Liver Function Tests: Recent Labs  Lab 01/09/21 1353  AST 24  ALT 17  ALKPHOS 128*  BILITOT 1.0  PROT 7.3  ALBUMIN 3.8     CBG: Recent Labs  Lab 01/11/21 1147 01/11/21 1645 01/11/21 2059 01/12/21 0742 01/12/21 1155  GLUCAP 191* 282* 255* 126* 183*      Recent Results (from the past 240 hour(s))  Urine Culture     Status: None   Collection Time: 01/09/21  1:49 PM   Specimen: In/Out Cath Urine  Result Value Ref Range Status   Specimen Description   Final    IN/OUT CATH URINE Performed at [facility], [place] [address], [place], [place] [address]    Special Requests   Final    NONE Performed at [facility], [place] [address], [place], [place] [address]    Culture   Final    NO GROWTH Performed at [facility], [place] [address], [place], [place] [address]    Report Status 01/10/2021 FINAL  Final  Culture, blood (routine x 2)     Status: None (Preliminary result)   Collection Time: 01/09/21  1:53  PM   Specimen: BLOOD  Result Value Ref Range Status   Specimen Description   Final    BLOOD RIGHT ANTECUBITAL Performed at [place] [address] [place], [place] [address]    Special Requests   Final    BOTTLES DRAWN AEROBIC AND ANAEROBIC Blood Culture results may not be optimal due to an inadequate volume of blood received in culture bottles Performed at [place] [address], [place], [place] [address]    Culture   Final    NO GROWTH 3 DAYS Performed at [facility], [place]  [address], [place], [place] [address]    Report Status PENDING  Incomplete  Resp Panel by RT-PCR (Flu A&B, Covid) Nasopharyngeal Swab     Status: None   Collection Time: 01/09/21  1:53 PM   Specimen: Nasopharyngeal Swab; Nasopharyngeal(NP) swabs in vial transport medium  Result Value Ref Range Status   SARS Coronavirus 2 by RT PCR NEGATIVE NEGATIVE Final    Comment: (NOTE) SARS-CoV-2 target nucleic acids are NOT DETECTED.  The SARS-CoV-2 RNA is generally detectable in upper respiratory specimens during the acute phase of infection. The lowest concentration of SARS-CoV-2 viral copies this assay can detect is 138 copies/mL. A negative result does not preclude SARS-Cov-2 infection and should not be used as the sole basis for treatment or other patient management decisions. A negative result may occur with  improper specimen collection/handling, submission of specimen other than nasopharyngeal swab, presence of viral mutation(s) within the areas targeted by this assay, and inadequate number of viral copies(<138 copies/mL). A negative result must be combined with clinical observations, patient history, and epidemiological information. The expected result is Negative.  Fact Sheet for Patients:  EntrepreneurPulse.com.au  Fact Sheet for Healthcare Providers:  IncredibleEmployment.be  This test is no t yet approved or  cleared by the [place] FDA and  has been authorized for detection and/or diagnosis of SARS-CoV-2 by FDA under an Emergency Use Authorization (EUA). This EUA will remain  in effect (meaning this test can be used) for the duration of the COVID-19 declaration under Section 564(b)(1) of the Act, 21 U.S.C.section 360bbb-3(b)(1), unless the authorization is terminated  or revoked sooner.       Influenza A by PCR NEGATIVE NEGATIVE Final   Influenza B by PCR NEGATIVE NEGATIVE Final    Comment: (NOTE) The Xpert Xpress SARS-CoV-2/FLU/RSV plus assay is intended as an aid in the diagnosis of influenza from Nasopharyngeal swab specimens and should not be used as a sole basis for treatment. Nasal washings and aspirates are unacceptable for Xpert Xpress SARS-CoV-2/FLU/RSV testing.  Fact Sheet for Patients: EntrepreneurPulse.com.au  Fact Sheet for Healthcare Providers: IncredibleEmployment.be  This test is not yet approved or cleared by the [place] FDA and has been authorized for detection and/or diagnosis of SARS-CoV-2 by FDA under an Emergency Use Authorization (EUA). This EUA will remain in effect (meaning this test can be used) for the duration of the COVID-19 declaration under Section 564(b)(1) of the Act, 21 U.S.C. section 360bbb-3(b)(1), unless the authorization is terminated or revoked.  Performed at [facility], [place] [address], [place], [place] [address]   Culture, blood (routine x 2)     Status: None (Preliminary result)   Collection Time: 01/09/21  1:54 PM   Specimen: BLOOD  Result Value Ref Range Status   Specimen Description   Final    BLOOD BLOOD RIGHT FOREARM Performed at [place] [address], [place], [place] [address]    Special Requests   Final    BOTTLES DRAWN AEROBIC AND ANAEROBIC Blood Culture adequate volume Performed at [place] [address], [place], [place] [address]    Culture   Final    NO GROWTH 3 DAYS Performed at [facility], [place] [address], [place], [place] [address]    Report Status PENDING  Incomplete          Radiology Studies: EEG adult  Result Date: January 29, 2021 Lora Havens, MD     01/11/2021  8:15 AM Patient Name: Jennica Tagliaferri  [place] MRN: [id number] Epilepsy Attending: Lora Havens Referring Physician/Provider: Dr Arline Asp Date: 01/10/2021 Duration: 21.40 mins Patient history: 80 year old female with history of seizures and dementia now with altered mental status. EEG to evaluate for seizures. Level of alertness: Awake AEDs during EEG study: Keppra, gabapentin Technical aspects: This EEG study was done with scalp electrodes positioned according to the 10-20 International system of electrode placement. Electrical activity was acquired at a sampling rate of 500Hz  and reviewed with a high frequency filter of 70Hz  and a low frequency filter of 1Hz . EEG data were recorded continuously and digitally stored. Description: The posterior dominant rhythm consists of 8 Hz activity of moderate voltage (25-35 uV) seen predominantly in posterior head regions, symmetric and reactive to eye opening and eye closing.  Hyperventilation and photic stimulation were not performed.   IMPRESSION: This study is within normal limits. No seizures or epileptiform discharges were seen throughout the recording. Priyanka Barbra Sarks        Scheduled Meds:  amLODipine  10 mg Oral q morning   Chlorhexidine Gluconate Cloth  6 each Topical Daily   enoxaparin (LOVENOX) injection  30 mg Subcutaneous Q24H   gabapentin  100 mg Oral BID   insulin aspart  0-9 Units Subcutaneous TID WC   insulin aspart  2 Units Subcutaneous TID WC   insulin aspart protamine- aspart  6 Units Subcutaneous BID WC   levETIRAcetam  500 mg Oral BID   memantine  10 mg Oral BID   multivitamin with minerals  1 tablet Oral Daily   pantoprazole  40 mg Oral BID    Continuous Infusions:     LOS: 0 days        Hosie Poisson, MD [facility]   To contact the attending provider between 7A-7P or the covering provider during after hours 7P-7A, please log into the web site [email or web address] and access using universal [place] password for that web site. If you do not have the password, please call the hospital operator.  01/12/2021, 2:18 PM

## 2021-01-12 NOTE — TOC Progression Note (Addendum)
Transition of Care [facility]) - Progression Note    Patient Details  Name: [name] MRN: [id number] Date of Birth: Jan 05, 1941  [facility] CM/SW Contact  Ross Ludwig, [place] Phone Number: 01/11/21, 4:47 PM  Clinical Narrative:    CSW spoke to [place], said that patient and her husband stated they are not happy with the Medicaid private duty care providers.  CSW informed her that they need to talk with the private duty care agency and the Medicaid worker to discuss trying to find a different agency.  CSW spoke to patient's family member Tabitha, and informed her that home health has been set up with [facility].  Patient's family member also requested that palliative follow outpatient, CSW spoke to [facility], who agreed to accept patient.  CSW also informed her that the hospital bed should be delivered today or tomorrow.  Patient will be going home with home health PT, RN, and soc work through [facility].  CSW also explained to [place] that home health will not be there every day, and the home health agency will assess patient to determine how often patient can be seen.  She expressed understanding.       Barriers to Discharge: Barriers Resolved  Expected Discharge Plan and Services    Plan to discharge back home with home health.                       DME Arranged: Hospital bed DME Agency: [employer] Date DME Agency Contacted: 01/11/21 Time DME Agency Contacted: [phone] Representative spoke with at DME Agency: [place]: PT, RN, Social Work [employer] Agency: [place] Date [place]: 01/11/21 Time [place]: 57 Representative spoke with at [place]: [place] ([place]) Interventions    Readmission Risk Interventions No flowsheet data found.

## 2021-01-13 ENCOUNTER — Ambulatory Visit: Payer: Medicare Other | Admitting: Physical Therapy

## 2021-01-13 ENCOUNTER — Ambulatory Visit: Payer: Medicare Other | Admitting: Occupational Therapy

## 2021-01-13 DIAGNOSIS — E860 Dehydration: Secondary | ICD-10-CM | POA: Diagnosis not present

## 2021-01-13 DIAGNOSIS — N179 Acute kidney failure, unspecified: Secondary | ICD-10-CM | POA: Diagnosis not present

## 2021-01-13 DIAGNOSIS — R4182 Altered mental status, unspecified: Secondary | ICD-10-CM

## 2021-01-13 DIAGNOSIS — G9341 Metabolic encephalopathy: Secondary | ICD-10-CM | POA: Diagnosis not present

## 2021-01-13 LAB — LEVETIRACETAM LEVEL: Levetiracetam Lvl: 45.1 ug/mL — ABNORMAL HIGH (ref 10.0–40.0)

## 2021-01-13 LAB — GLUCOSE, CAPILLARY
Glucose-Capillary: 215 mg/dL — ABNORMAL HIGH (ref 70–99)
Glucose-Capillary: 112 mg/dL — ABNORMAL HIGH (ref 70–99)
Glucose-Capillary: 292 mg/dL — ABNORMAL HIGH (ref 70–99)
Glucose-Capillary: 180 mg/dL — ABNORMAL HIGH (ref 70–99)

## 2021-01-13 NOTE — Discharge Summary (Addendum)
Physician Discharge Summary  [name] HGD:[id number] DOB: [birth year] DOA: 01/09/2021  PCP: Mike Craze, DO  Admit date: 01/09/2021 Discharge date: 01/15/2021  Admitted From:Home.  Disposition:  Home.   Recommendations for Outpatient Follow-up:  Follow up with PCP in 1-2 weeks Please obtain BMP/CBC in one week Please follow up with neurology as outpatient.  Please follow up with cardiology for Holter Monitor placement.  Please follow up with palliative care outpatient.  Please follow up with home health.   Home Health:Yes Equipment/Devices:Hospital bed.   Discharge Condition:Guarded.  CODE STATUS:full code.  Diet recommendation: Heart Healthy    Brief/Interim Summary:   [name] is a 80 y.o. 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 As per god daughter, this is the second episode where she becomes unresponsive / lethargic after not drinking for two days. Then she comes to ED, gets IV fluids and perks up and is back to baseline.   Discharge Diagnoses:  Principal Problem:   Acute metabolic encephalopathy  Acute metabolic encephalopathy in the setting of baseline dementia: Probably secondary to AKI/dehydration. She is back to baseline but has recent memory impairment.  Differentials include seizures. Initial CT of the head is negative for acute intracranial abnormality MRI of the brain without contrast ordered , showed  No evidence of recent infarction, hemorrhage, or mass. Advanced chronic microvascular ischemic changes.  Since this is the second episode we will get EEG. EEG does not show any epileptiform activity.  Vitamin b12 is 450. TSH wnl.   Discussed with Dr Cheral Marker, and with no events inpatient, recommended outpatient follow up with Neurology for ambulatory EEG and cardiology  referral for Holter monitor placement.     History of GI bleed  secondary to gastric AV malformations Continue with PPI.       AKI on stage IIIa CKD Improved with IV fluids. Creatinine is 1.4, improving. Recommend to watch her off IV fluids .        Type 2 diabetes mellitus Last A1c is 7.8.  Restart her home meds on discharge.      Dementia: Without any behavioral abnormalities.  Continue with Namenda.      Hypertension:  Well controlled.  Resume amlodipine.  Stop the Benicar.      H/o seizures: Continue with keppra EEG does not show any epileptiform activity.       Diabetic neuropathy:  - continue with Gabapentin.    Mild thrombocytopenia:       In view of her advanced age and multiple medical issues and readmissions, will request palliative care for goals of care.       Discharge Instructions  Discharge Instructions     Ambulatory referral to Cardiology   Complete by: As directed    Holter monitor placement.   Ambulatory referral to Neurology   Complete by: As directed    An appointment is requested in approximately: 1 week for ambulatory EEG referral.   Diet - low sodium heart healthy   Complete by: As directed    Discharge instructions   Complete by: As directed    Please follow up with cardiology for holter monitor placement and Neurology for ambulatory EEG monitoring.      Allergies as of 01/13/2021       Reactions   Aspirin Nausea And Vomiting        Medication  List     STOP taking these medications    olmesartan 20 MG tablet Commonly known as: BENICAR       TAKE these medications    Accu-Chek Guide test strip Generic drug: glucose blood USE AS INSTRUCTED TO CHECK BLOOD SUGAR 3 TIMES DAILY.   Accu-Chek Guide test strip Generic drug: glucose blood USE AS INSTRUCTED TO CHECK BLOOD SUGAR 3 TIMES DAILY.   Accu-Chek Softclix Lancets lancets 1 each by Other route 3 (three) times daily. Use as instructed to check blood  sugar 3 times per day dx code E11.65   Lancets Misc 1 each by Other route.   amLODipine 10 MG tablet Commonly known as: NORVASC TAKE 1 TABLET BY MOUTH EVERY MORNING What changed: when to take this   B-D UF III MINI PEN NEEDLES 31G X 5 MM Misc Generic drug: Insulin Pen Needle USE TO INJECT INSULIN THREE TIMES A DAY   diclofenac Sodium 1 % Gel Commonly known as: VOLTAREN Apply 2 g topically 4 (four) times daily. What changed:  when to take this reasons to take this   FreeStyle Libre 2 Sensor Misc 2 Devices by Does not apply route every 14 (fourteen) days.   gabapentin 100 MG capsule Commonly known as: NEURONTIN TAKE 1 CAPSULE BY MOUTH TWICE DAILY   HumaLOG Mix 75/25 KwikPen (75-25) 100 UNIT/ML Kwikpen Generic drug: Insulin Lispro Prot & Lispro INJECT 10 UNITS SUBCUTANEOUSLY ONCE DAILY What changed: See the new instructions.   IMMUNE FORMULA PO Take 1 tablet by mouth daily.   insulin lispro 100 UNIT/ML KwikPen Commonly known as: HumaLOG KwikPen 3 units before dinner What changed:  how much to take how to take this when to take this additional instructions   levETIRAcetam 500 MG tablet Commonly known as: KEPPRA TAKE 1 TABLET BY MOUTH TWICE DAILY   Medical Compression Stockings Misc Wear as much as possible while awake to reduce swelling   memantine 10 MG tablet Commonly known as: NAMENDA TAKE 1 TABLET BY MOUTH TWICE DAILY   Multi For Her 50+ Tabs Take 1 tablet by mouth daily with breakfast.   pantoprazole 40 MG tablet Commonly known as: PROTONIX Take 1 tablet (40 mg total) by mouth 2 (two) times daily.   potassium chloride SA 20 MEQ tablet Commonly known as: KLOR-CON M Take 20 mEq by mouth 2 (two) times daily.   triamcinolone ointment 0.5 % Commonly known as: KENALOG APPLY 1 APPLICATION TOPICALLY TO RASH TWICE DAILY FOR ITCHING What changed: See the new instructions.   Trulicity 1.5 QI/6.9GE Sopn Generic drug: Dulaglutide INJECT 1.5MG   SUBCUTANEOUSLY EVERY WEEK ON THURSDAY What changed: See the new instructions.               Durable Medical Equipment  (From admission, onward)           Start     Ordered   01/09/21 1526  For home use only DME Hospital bed  Once       Question Answer Comment  Length of Need Lifetime   Patient has (list medical condition): failure to thrive   The above medical condition requires: Patient requires the ability to reposition frequently   Bed type Semi-electric   Support Surface: Gel Overlay      01/09/21 1526   01/09/21 1522  For home use only DME Hospital bed  Once       Question Answer Comment  Length of Need Lifetime   Bed type Semi-electric      01/09/21  1521            Allergies  Allergen Reactions   Aspirin Nausea And Vomiting    Consultations: Palliative care    Procedures/Studies: CT Head Wo Contrast  Result Date: 01/09/2021 CLINICAL DATA:  Altered mental status confusion EXAM: CT HEAD WITHOUT CONTRAST TECHNIQUE: Contiguous axial images were obtained from the base of the skull through the vertex without intravenous contrast. COMPARISON:  Brain MRI 08/28/2014, CT head 12/31/2020 FINDINGS: Brain: There is no evidence of acute intracranial hemorrhage, extra-axial fluid collection, or acute infarct. There is unchanged mild global parenchymal volume loss. The ventricles are stable in size. Confluent hypodensity in the subcortical and periventricular white matter is unchanged, likely reflecting sequela of advanced chronic white matter microangiopathy. There is no solid mass lesion.  There is no midline shift. Vascular: There is calcification of the bilateral cavernous ICAs. Skull: Normal. Negative for fracture or focal lesion. Sinuses/Orbits: There is mild mucosal thickening in the left maxillary sinus. Bilateral lens implants are in place. The globes and orbits are otherwise unremarkable. Other: None. IMPRESSION: 1. No acute intracranial pathology. 2. Unchanged  global parenchymal volume loss and chronic white matter microangiopathy. Electronically Signed   By: Valetta Mole M.D.   On: 01/09/2021 14:35   CT Head Wo Contrast  Result Date: 12/31/2020 CLINICAL DATA:  Mental status change, unknown cause. Additional history provided: Altered mental status, suspected GI bleed, lethargy. EXAM: CT HEAD WITHOUT CONTRAST TECHNIQUE: Contiguous axial images were obtained from the base of the skull through the vertex without intravenous contrast. COMPARISON:  Prior head CT examinations 10/03/2014 and earlier. Brain MRI 08/28/2014. FINDINGS: Brain: Moderate cerebral atrophy.  Comparatively mild cerebellar atrophy. Advanced patchy and ill-defined hypoattenuation within the cerebral white matter, nonspecific but compatible with chronic small vessel ischemic disease. There is no acute intracranial hemorrhage. No demarcated cortical infarct. No extra-axial fluid collection. No evidence of an intracranial mass. No midline shift. Vascular: No hyperdense vessel.  Atherosclerotic calcifications. Skull: Normal. Negative for fracture or focal lesion. Sinuses/Orbits: Visualized orbits show no acute finding. Mild mucosal thickening within the bilateral ethmoid and maxillary sinuses at the imaged levels. Trace mucosal thickening within the right sphenoid sinus. IMPRESSION: No evidence of acute intracranial abnormality. Advanced chronic small vessel ischemic changes within the cerebral white matter. Moderate cerebral atrophy. Comparatively mild cerebellar atrophy. Mild paranasal sinus mucosal thickening at the imaged levels, as described. Electronically Signed   By: Kellie Simmering D.O.   On: 12/31/2020 18:05   MR BRAIN WO CONTRAST  Result Date: 01/12/2021 CLINICAL DATA:  Mental status change, unknown cause EXAM: MRI HEAD WITHOUT CONTRAST TECHNIQUE: Multiplanar, multiecho pulse sequences of the brain and surrounding structures were obtained without intravenous contrast. COMPARISON:  2016  FINDINGS: Brain: There is no acute infarction or intracranial hemorrhage. There is no intracranial mass, mass effect, or edema. There is no hydrocephalus or extra-axial fluid collection. Prominence of the ventricles and sulci reflects parenchymal volume loss. Confluent T2 hyperintensity in the supratentorial white matter is nonspecific but probably reflect advanced chronic microvascular ischemic changes. Few punctate foci of susceptibility in the left occipital lobe likely reflect chronic microhemorrhages. Vascular: Major vessel flow voids at the skull base are preserved. Skull and upper cervical spine: Normal marrow signal is preserved. Sinuses/Orbits: Paranasal sinus mucosal thickening. Bilateral lens replacements. Other: Sella is unremarkable.  Mastoid air cells are clear. IMPRESSION: No evidence of recent infarction, hemorrhage, or mass. Advanced chronic microvascular ischemic changes. Electronically Signed   By: Addison Lank.D.  On: 01/12/2021 16:00   DG Chest Portable 1 View  Result Date: 01/09/2021 CLINICAL DATA:  Altered mental status EXAM: PORTABLE CHEST 1 VIEW COMPARISON:  Chest x-ray 12/31/2020 FINDINGS: Heart is enlarged. Mediastinum appears stable. Calcified plaques in the aortic arch. Right-sided central venous port with the tip in the SVC. Pulmonary vasculature appears within normal limits. No focal consolidation identified. No pleural effusion or pneumothorax. IMPRESSION: Cardiomegaly with no acute process identified. Electronically Signed   By: Ofilia Neas M.D.   On: 01/09/2021 14:36   DG Chest Port 1 View  Result Date: 12/31/2020 CLINICAL DATA:  Shortness of breath EXAM: PORTABLE CHEST 1 VIEW COMPARISON:  Chest radiograph dated September 29, 2020 FINDINGS: The heart is enlarged. Atherosclerotic calcification of the aortic arch. Prominence of the central pulmonary vessels. No evidence of pulmonary edema or large pleural effusion. Right-sided MediPort with distal tip in the SVC,  unchanged. No acute osseous abnormality. IMPRESSION: 1.  Stable cardiomegaly. 2. Pulmonary vascular congestion without evidence of pulmonary edema or pleural effusion. Electronically Signed   By: Keane Police D.O.   On: 12/31/2020 17:51   EEG adult  Result Date: 01/10/2021 Lora Havens, MD     01/11/2021  8:15 AM Patient Name: MADELINE PHO MRN: [id number] Epilepsy Attending: Lora Havens Referring Physician/Provider: Dr Arline Asp Date: 01/10/2021 Duration: 21.40 mins Patient history: 80 year old female with history of seizures and dementia now with altered mental status. EEG to evaluate for seizures. Level of alertness: Awake AEDs during EEG study: Keppra, gabapentin Technical aspects: This EEG study was done with scalp electrodes positioned according to the 10-20 International system of electrode placement. Electrical activity was acquired at a sampling rate of 500Hz  and reviewed with a high frequency filter of 70Hz  and a low frequency filter of 1Hz . EEG data were recorded continuously and digitally stored. Description: The posterior dominant rhythm consists of 8 Hz activity of moderate voltage (25-35 uV) seen predominantly in posterior head regions, symmetric and reactive to eye opening and eye closing.  Hyperventilation and photic stimulation were not performed.   IMPRESSION: This study is within normal limits. No seizures or epileptiform discharges were seen throughout the recording. Lora Havens   EEG adult  Result Date: 12/31/2020 Lora Havens, MD     12/31/2020  8:18 PM Patient Name: SEAIRRA OTANI MRN: [id number] Epilepsy Attending: Lora Havens Referring Physician/Provider: Dr Su Monks Date: 12/31/2020 Duration: 23.14 mins Patient history: 80yo F with ams. EEG to evaluate for seizure Level of alertness: Awake AEDs during EEG study: None Technical aspects: This EEG study was done with scalp electrodes positioned according to the 10-20 International system of electrode  placement. Electrical activity was acquired at a sampling rate of 500Hz  and reviewed with a high frequency filter of 70Hz  and a low frequency filter of 1Hz . EEG data were recorded continuously and digitally stored. Description: No posterior dominant rhythm was seen. EEG showed continuous generalized 3 to 6 Hz theta-delta slowing.Intermittent generalized periodic discharges with triphasic morphology at 1-1.5 Hz were also noted, more prominent when awake/stimulated. Hyperventilation and photic stimulation were not performed.   ABNORMALITY - Periodic discharges with triphasic morphology, generalized ( GPDs) - Continuous slow, generalized IMPRESSION: This study generalized periodic discharges with triphasic morphology at 1-1.5 Hz which can be on the ictal-interictal continuum. However, the frequency and morphology as well as reactivity to stimulation is more commonly due to toxic-metabolic causes like cefepime toxicity, hyperammonemia, uremia. Additionally there is moderate diffuse encephalopathy, nonspecific etiology. No  seizures  were seen throughout the recording. Priyanka Barbra Sarks       Subjective: No new events.   Discharge Exam: Vitals:   01/13/21 0556 01/13/21 1342  BP: 113/61 (!) 116/49  Pulse: 61 (!) 53  Resp: 16 14  Temp: 98.7 F (37.1 C) 98.1 F (36.7 C)  SpO2: 100% 99%   Vitals:   01/12/21 1340 01/12/21 2141 01/13/21 0556 01/13/21 1342  BP: (!) 136/55 (!) 128/50 113/61 (!) 116/49  Pulse: (!) 51 (!) 51 61 (!) 53  Resp: 18 18 16 14   Temp: 97.8 F (36.6 C) 98.1 F (36.7 C) 98.7 F (37.1 C) 98.1 F (36.7 C)  TempSrc: Oral Oral Oral Oral  SpO2: 100% 96% 100% 99%  Weight:      Height:        General: Pt is alert, awake, not in acute distress Cardiovascular: RRR, S1/S2 +, no rubs, no gallops Respiratory: CTA bilaterally, no wheezing, no rhonchi Abdominal: Soft, NT, ND, bowel sounds + Extremities: no edema, no cyanosis    The results of significant diagnostics from this  hospitalization (including imaging, microbiology, ancillary and laboratory) are listed below for reference.     Microbiology: Recent Results (from the past 240 hour(s))  Urine Culture     Status: None   Collection Time: 01/09/21  1:49 PM   Specimen: In/Out Cath Urine  Result Value Ref Range Status   Specimen Description   Final    IN/OUT CATH URINE Performed at [facility], [place] [address] [place], [place] [address]    Special Requests   Final    NONE Performed at [facility], [place] [address], [place], [place] [address]    Culture   Final    NO GROWTH Performed at [facility], [place] [address], [place], [place] [address]    Report Status 01/10/2021 FINAL  Final  Culture, blood (routine x 2)     Status: None (Preliminary result)   Collection Time: 01/09/21  1:53 PM   Specimen: BLOOD  Result Value Ref Range Status   Specimen Description   Final    BLOOD RIGHT ANTECUBITAL Performed at [place] [address], [place], [place] [address]    Special Requests   Final    BOTTLES DRAWN AEROBIC AND ANAEROBIC Blood Culture results may not be optimal due to an inadequate volume of blood received in culture bottles Performed at [place] [address] [place],  [address]    Culture   Final    NO GROWTH 4 DAYS Performed at [facility], [place] [address] [place],  [address]    Report Status PENDING  Incomplete  Resp Panel by RT-PCR (Flu A&B, Covid) Nasopharyngeal Swab     Status: None   Collection Time: 01/09/21  1:53 PM   Specimen: Nasopharyngeal Swab; Nasopharyngeal(NP) swabs in vial transport medium  Result Value Ref Range Status   SARS Coronavirus 2 by RT PCR NEGATIVE NEGATIVE Final    Comment: (NOTE) SARS-CoV-2 target nucleic acids are NOT DETECTED.  The SARS-CoV-2 RNA is generally detectable in upper respiratory specimens during the acute phase of infection. The  lowest concentration of SARS-CoV-2 viral copies this assay can detect is 138 copies/mL. A negative result does not preclude SARS-Cov-2 infection and should not be used as the sole basis for treatment or other patient management decisions. A negative result may occur with  improper specimen collection/handling, submission of specimen other  than nasopharyngeal swab, presence of viral mutation(s) within the areas targeted by this assay, and inadequate number of viral copies(<138 copies/mL). A negative result must be combined with clinical observations, patient history, and epidemiological information. The expected result is Negative.  Fact Sheet for Patients:  EntrepreneurPulse.com.au  Fact Sheet for Healthcare Providers:  IncredibleEmployment.be  This test is no t yet approved or cleared by the [place] FDA and  has been authorized for detection and/or diagnosis of SARS-CoV-2 by FDA under an Emergency Use Authorization (EUA). This EUA will remain  in effect (meaning this test can be used) for the duration of the COVID-19 declaration under Section 564(b)(1) of the Act, 21 U.S.C.section 360bbb-3(b)(1), unless the authorization is terminated  or revoked sooner.       Influenza A by PCR NEGATIVE NEGATIVE Final   Influenza B by PCR NEGATIVE NEGATIVE Final    Comment: (NOTE) The Xpert Xpress SARS-CoV-2/FLU/RSV plus assay is intended as an aid in the diagnosis of influenza from Nasopharyngeal swab specimens and should not be used as a sole basis for treatment. Nasal washings and aspirates are unacceptable for Xpert Xpress SARS-CoV-2/FLU/RSV testing.  Fact Sheet for Patients: EntrepreneurPulse.com.au  Fact Sheet for Healthcare Providers: IncredibleEmployment.be  This test is not yet approved or cleared by the [place] FDA and has been authorized for detection and/or diagnosis of SARS-CoV-2 by FDA under  an Emergency Use Authorization (EUA). This EUA will remain in effect (meaning this test can be used) for the duration of the COVID-19 declaration under Section 564(b)(1) of the Act, 21 U.S.C. section 360bbb-3(b)(1), unless the authorization is terminated or revoked.  Performed at [facility], [place] [address] [place], [place] [address]   Culture, blood (routine x 2)     Status: None (Preliminary result)   Collection Time: 01/09/21  1:54 PM   Specimen: BLOOD  Result Value Ref Range Status   Specimen Description   Final    BLOOD BLOOD RIGHT FOREARM Performed at [place] [address], [place], [place] [address]    Special Requests   Final    BOTTLES DRAWN AEROBIC AND ANAEROBIC Blood Culture adequate volume Performed at [place] [address] [place], [place] [address]    Culture   Final    NO GROWTH 4 DAYS Performed at [facility], [place] [address] [place],  [address]    Report Status PENDING  Incomplete     Labs: BNP (last 3 results) Recent Labs    09/29/20 0118  BNP 932.3*   Basic Metabolic Panel: Recent Labs  Lab 01/09/21 1353 01/10/21 0436 01/11/21 0240 01/12/21 1415  NA 141 143 138 138  K 4.8 4.1 4.5 4.5  CL 111 114* 110 109  CO2 19* 23 20* 24  GLUCOSE 192* 243* 237* 301*  BUN 24* 20 16 16   CREATININE 2.00* 1.63* 1.43* 1.46*  CALCIUM 9.5 8.6* 8.3* 9.1   Liver Function Tests: Recent Labs  Lab 01/09/21 1353  AST 24  ALT 17  ALKPHOS 128*  BILITOT 1.0  PROT 7.3  ALBUMIN 3.8   No results for input(s): LIPASE, AMYLASE in the last 168 hours. No results for input(s): AMMONIA in the last 168 hours. CBC: Recent Labs  Lab 01/09/21 1353 01/10/21 0436 01/12/21 1415  WBC 6.0 4.7 5.5  NEUTROABS 3.4  --  3.6  HGB 14.3 11.2* 12.4  HCT 43.3 33.4* 35.9*  MCV 92.1 89.5 89.5  PLT 158 128* 152  Cardiac Enzymes: No results for input(s): CKTOTAL, CKMB, CKMBINDEX, TROPONINI in  the last 168 hours. BNP: Invalid input(s): POCBNP CBG: Recent Labs  Lab 01/12/21 1155 01/12/21 1617 01/12/21 2136 01/13/21 0746 01/13/21 1239  GLUCAP 183* 227* 344* 215* 112*   D-Dimer No results for input(s): DDIMER in the last 72 hours. Hgb A1c No results for input(s): HGBA1C in the last 72 hours. Lipid Profile No results for input(s): CHOL, HDL, LDLCALC, TRIG, CHOLHDL, LDLDIRECT in the last 72 hours. Thyroid function studies No results for input(s): TSH, T4TOTAL, T3FREE, THYROIDAB in the last 72 hours.  Invalid input(s): FREET3 Anemia work up No results for input(s): VITAMINB12, FOLATE, FERRITIN, TIBC, IRON, RETICCTPCT in the last 72 hours. Urinalysis    Component Value Date/Time   COLORURINE STRAW (A) 01/09/2021 1348   APPEARANCEUR CLEAR 01/09/2021 1348   LABSPEC 1.006 01/09/2021 1348   LABSPEC 1.025 10/19/2006 1254   PHURINE 5.0 01/09/2021 1348   GLUCOSEU NEGATIVE 01/09/2021 1348   HGBUR NEGATIVE 01/09/2021 [place] 01/09/2021 1348   BILIRUBINUR neg 05/21/2016 1605   BILIRUBINUR Negative 10/19/2006 1254   KETONESUR NEGATIVE 01/09/2021 1348   PROTEINUR NEGATIVE 01/09/2021 1348   UROBILINOGEN 0.2 05/21/2016 1605   UROBILINOGEN 0.2 10/02/2014 2307   NITRITE NEGATIVE 01/09/2021 1348   LEUKOCYTESUR NEGATIVE 01/09/2021 1348   LEUKOCYTESUR Negative 10/19/2006 1254   Sepsis Labs Invalid input(s): PROCALCITONIN,  WBC,  LACTICIDVEN Microbiology Recent Results (from the past 240 hour(s))  Urine Culture     Status: None   Collection Time: 01/09/21  1:49 PM   Specimen: In/Out Cath Urine  Result Value Ref Range Status   Specimen Description   Final    IN/OUT CATH URINE Performed at [facility], [place] [address], [place], [place] [address]    Special Requests   Final    NONE Performed at [facility], [place] [address], [place], [place] [address]    Culture   Final    NO GROWTH Performed at [facility] [place], [place] [address], [place], [place] [address]    Report Status 01/10/2021 FINAL  Final  Culture, blood (routine x 2)     Status: None (Preliminary result)   Collection Time: 01/09/21  1:53 PM   Specimen: BLOOD  Result Value Ref Range Status   Specimen Description   Final    BLOOD RIGHT ANTECUBITAL Performed at [place] [address], [place], [place] [address]    Special Requests   Final    BOTTLES DRAWN AEROBIC AND ANAEROBIC Blood Culture results may not be optimal due to an inadequate volume of blood received in culture bottles Performed at [place] [address] [place], [place] [address]    Culture   Final    NO GROWTH 4 DAYS Performed at [facility], [place] [address], [place], [place] [address]    Report Status PENDING  Incomplete  Resp Panel by RT-PCR (Flu A&B, Covid) Nasopharyngeal Swab     Status: None   Collection Time: 01/09/21  1:53 PM   Specimen: Nasopharyngeal Swab; Nasopharyngeal(NP) swabs in vial transport medium  Result Value Ref Range Status   SARS Coronavirus 2 by RT PCR NEGATIVE NEGATIVE Final    Comment: (NOTE) SARS-CoV-2 target nucleic acids are NOT DETECTED.  The SARS-CoV-2 RNA is generally detectable in upper respiratory specimens during the acute phase of infection. The lowest concentration of SARS-CoV-2 viral copies this assay can detect is 138  copies/mL. A negative result does not preclude SARS-Cov-2 infection and should not be used as the sole basis for treatment or other patient management decisions. A negative result may occur with  improper specimen collection/handling, submission of specimen other than nasopharyngeal swab, presence of viral mutation(s) within the areas targeted by this assay, and inadequate number of viral copies(<138 copies/mL). A negative result must be combined with clinical observations, patient history, and epidemiological information. The expected result is  Negative.  Fact Sheet for Patients:  EntrepreneurPulse.com.au  Fact Sheet for Healthcare Providers:  IncredibleEmployment.be  This test is no t yet approved or cleared by the [place] FDA and  has been authorized for detection and/or diagnosis of SARS-CoV-2 by FDA under an Emergency Use Authorization (EUA). This EUA will remain  in effect (meaning this test can be used) for the duration of the COVID-19 declaration under Section 564(b)(1) of the Act, 21 U.S.C.section 360bbb-3(b)(1), unless the authorization is terminated  or revoked sooner.       Influenza A by PCR NEGATIVE NEGATIVE Final   Influenza B by PCR NEGATIVE NEGATIVE Final    Comment: (NOTE) The Xpert Xpress SARS-CoV-2/FLU/RSV plus assay is intended as an aid in the diagnosis of influenza from Nasopharyngeal swab specimens and should not be used as a sole basis for treatment. Nasal washings and aspirates are unacceptable for Xpert Xpress SARS-CoV-2/FLU/RSV testing.  Fact Sheet for Patients: EntrepreneurPulse.com.au  Fact Sheet for Healthcare Providers: IncredibleEmployment.be  This test is not yet approved or cleared by the [place] FDA and has been authorized for detection and/or diagnosis of SARS-CoV-2 by FDA under an Emergency Use Authorization (EUA). This EUA will remain in effect (meaning this test can be used) for the duration of the COVID-19 declaration under Section 564(b)(1) of the Act, 21 U.S.C. section 360bbb-3(b)(1), unless the authorization is terminated or revoked.  Performed at [facility], [place] [address] [place], [place] [address]   Culture, blood (routine x 2)     Status: None (Preliminary result)   Collection Time: 01/09/21  1:54 PM   Specimen: BLOOD  Result Value Ref Range Status   Specimen Description   Final    BLOOD BLOOD RIGHT FOREARM Performed at [place]  [address] [place], [place] [address]    Special Requests   Final    BOTTLES DRAWN AEROBIC AND ANAEROBIC Blood Culture adequate volume Performed at [place] [address], [place], [place] [address]    Culture   Final    NO GROWTH 4 DAYS Performed at [facility], [place] [address], [place], [place] [address]    Report Status PENDING  Incomplete     Time coordinating discharge: 36 minutes  SIGNED:   Hosie Poisson, MD  [facility] 01/13/2021, 3:50 PM

## 2021-01-13 NOTE — Progress Notes (Signed)
Physical Therapy Treatment Patient Details Name: [name] MRN: [id number] DOB: Jun 10, 1940 Today's Date: 01/13/2021   History of Present Illness 80 y.o. F admitted on 01/09/21 due to AMS and increased lethargy.  Pt dx with dehydration, AKI, and metabolic encephalopathy.  PMH significant for Gastric Ulcer (11/17), GI Bleeds, Anemia, HTN, DM2, Seizures.    PT Comments    General Comments: AxO x 3 pleasant and following all instructions.  Assisted OOB with increased time.  General bed mobility comments: pt self able with increased time.  General transfer comment: required 3 self attempts to stand from bed present with Mod Poersterior lean.  General Gait Details: pt did amb herselt to bathroom a distance of 8 feet with walker with forward flex posture.  Slow but steady.  Fair + balance pivoting to toilet and controlled desend.  Pt admits to using her Rollator walker within home. Pt plans to return to home later today with family support.    Recommendations for follow up therapy are one component of a multi-disciplinary discharge planning process, led by the attending physician.  Recommendations may be updated based on patient status, additional functional criteria and insurance authorization.  Follow Up Recommendations  Home health PT     Assistance Recommended at Discharge Frequent or constant Supervision/Assistance  Equipment Recommendations  None recommended by PT    Recommendations for Other Services       Precautions / Restrictions Precautions Precautions: Fall Restrictions Weight Bearing Restrictions: No     Mobility  Bed Mobility Overal bed mobility: Modified Independent             General bed mobility comments: pt self able with increased time    Transfers Overall transfer level: Needs assistance Equipment used: Rolling walker (2 wheels) Transfers: Sit to/from Stand Sit to Stand: Supervision           General transfer comment: required 3 self attempts to  stand from bed present with Mod Poersterior lean    Ambulation/Gait Ambulation/Gait assistance: Supervision Gait Distance (Feet): 8 Feet Assistive device: Rolling walker (2 wheels) Gait Pattern/deviations: Step-through pattern;Decreased stride length;Shuffle;Trunk flexed Gait velocity: Decreased     General Gait Details: pt did amb herselt to bathroom a distance of 8 feet with walker with forward flex posture.  Slow but steady.  Fair + balance pivoting to toilet and controlled desend.  Pt admits to using her Rollator walker within home.   Stairs             Wheelchair Mobility    Modified Rankin (Stroke Patients Only)       Balance Overall balance assessment: Needs assistance Sitting-balance support: No upper extremity supported;Feet supported Sitting balance-Leahy Scale: Good     Standing balance support: Bilateral upper extremity supported Standing balance-Leahy Scale: Poor                              Cognition Arousal/Alertness: Awake/alert Behavior During Therapy: WFL for tasks assessed/performed Overall Cognitive Status: Within Functional Limits for tasks assessed                                 General Comments: AxO x 3 pleasant and following all instructions        Exercises      General Comments        Pertinent Vitals/Pain Pain Assessment: No/denies pain    Home Living  Prior Function            PT Goals (current goals can now be found in the care plan section) Progress towards PT goals: Progressing toward goals    Frequency    Min 3X/week      PT Plan Current plan remains appropriate    Co-evaluation              AM-PAC PT "6 Clicks" Mobility   Outcome Measure  Help needed turning from your back to your side while in a flat bed without using bedrails?: None Help needed moving from lying on your back to sitting on the side of a flat bed without using  bedrails?: A Biever Help needed moving to and from a bed to a chair (including a wheelchair)?: A Breed Help needed standing up from a chair using your arms (e.g., wheelchair or bedside chair)?: A Eickhoff Help needed to walk in hospital room?: A Saravia Help needed climbing 3-5 steps with a railing? : A Ackroyd 6 Click Score: 19    End of Session Equipment Utilized During Treatment: Gait belt Activity Tolerance: Patient tolerated treatment well Patient left: Other (comment) (in bethroom with graddaughter in room) Nurse Communication: Mobility status PT Visit Diagnosis: Muscle weakness (generalized) (M62.81);Difficulty in walking, not elsewhere classified (R26.2);Unsteadiness on feet (R26.81)     Time: 9826-4158 PT Time Calculation (min) (ACUTE ONLY): 26 min  Charges:  $Gait Training: 8-22 mins $Therapeutic Activity: 8-22 mins                    Rica Koyanagi  PTA Acute  Rehabilitation Services Pager      [phone] Office      [phone]

## 2021-01-13 NOTE — Progress Notes (Signed)
Patient's family member, [name], states that she left a message for Tobi Bastos late this afternoon. She did not receive a call back because Jinny Blossom had left for the day.   [facility] holding [facility] bed for Ms. Hinds. The paperwork will be finished in the morning.

## 2021-01-13 NOTE — Progress Notes (Signed)
Occupational Therapy Treatment Patient Details Name: [name] MRN: [id number] DOB: 01/23/1941 Today's Date: 01/13/2021   History of present illness 80 y.o. F admitted on 01/09/21 due to AMS and increased lethargy.  Pt dx with dehydration, AKI, and metabolic encephalopathy.  PMH significant for Gastric Ulcer (11/17), GI Bleeds, Anemia, HTN, DM2, Seizures.   OT comments  Pt progressing towards acute OT goals. Focus of session was education on compensatory strategies for bathing at home as pt previously endorsed struggling with this ADL. Pt able to stand with min guard assist and use of rw. D/c recommendation remains appropriate.    Recommendations for follow up therapy are one component of a multi-disciplinary discharge planning process, led by the attending physician.  Recommendations may be updated based on patient status, additional functional criteria and insurance authorization.    Follow Up Recommendations  Home health OT    Assistance Recommended at Discharge Set up Supervision/Assistance  Equipment Recommendations  None recommended by OT    Recommendations for Other Services      Precautions / Restrictions Precautions Precautions: Fall Restrictions Weight Bearing Restrictions: No       Mobility Bed Mobility               General bed mobility comments: up in recliner    Transfers Overall transfer level: Needs assistance Equipment used: Rolling walker (2 wheels) Transfers: Sit to/from Stand Sit to Stand: Min guard           General transfer comment: steadying assist, extra time/effort. to/from recliner     Balance Overall balance assessment: Needs assistance Sitting-balance support: No upper extremity supported;Feet supported Sitting balance-Leahy Scale: Good     Standing balance support: Bilateral upper extremity supported Standing balance-Leahy Scale: Poor                             ADL either performed or assessed with clinical  judgement   ADL Overall ADL's : Needs assistance/impaired                                       General ADL Comments: Discussed energy conservation and strategies for bathing as pt endorsed struggle with this ADL at home. Pt able to stnad with close min guard assist, extra time and effort, rw.    Extremity/Trunk Assessment Upper Extremity Assessment Upper Extremity Assessment: Generalized weakness   Lower Extremity Assessment Lower Extremity Assessment: Defer to PT evaluation        Vision       Perception     Praxis      Cognition Arousal/Alertness: Awake/alert Behavior During Therapy: WFL for tasks assessed/performed Overall Cognitive Status: No family/caregiver present to determine baseline cognitive functioning                                 General Comments: answering questions appropriately. Some STM deficits noted          Exercises     Shoulder Instructions       General Comments      Pertinent Vitals/ Pain       Pain Assessment: No/denies pain  Home Living  Prior Functioning/Environment              Frequency  Min 2X/week        Progress Toward Goals  OT Goals(current goals can now be found in the care plan section)  Progress towards OT goals: Progressing toward goals  Acute Rehab OT Goals Patient Stated Goal: home OT Goal Formulation: With patient Time For Goal Achievement: 01/24/21 Potential to Achieve Goals: Good ADL Goals Pt Will Perform Grooming: with modified independence;standing Pt Will Perform Lower Body Bathing: sit to/from stand;with min guard assist Pt Will Perform Lower Body Dressing: with modified independence;sit to/from stand Pt Will Transfer to Toilet: with min guard assist;ambulating Pt Will Perform Toileting - Clothing Manipulation and hygiene: with min guard assist;sit to/from stand Additional ADL Goal #1: Pt will use RW  100% of the time with all ambulation.  Plan Discharge plan remains appropriate    Co-evaluation                 AM-PAC OT "6 Clicks" Daily Activity     Outcome Measure   Help from another person eating meals?: None Help from another person taking care of personal grooming?: None Help from another person toileting, which includes using toliet, bedpan, or urinal?: A Hedeen Help from another person bathing (including washing, rinsing, drying)?: A Fleagle Help from another person to put on and taking off regular upper body clothing?: A Bembenek Help from another person to put on and taking off regular lower body clothing?: A Lauderbaugh 6 Click Score: 20    End of Session Equipment Utilized During Treatment: Rolling walker (2 wheels)  OT Visit Diagnosis: Unsteadiness on feet (R26.81);Other abnormalities of gait and mobility (R26.89);Muscle weakness (generalized) (M62.81)   Activity Tolerance Patient tolerated treatment well   Patient Left in chair;with call bell/phone within reach;with chair alarm set   Nurse Communication          Time: 2774-1287 OT Time Calculation (min): 11 min  Charges: OT General Charges $OT Visit: 1 Visit OT Treatments $Self Care/Home Management : 8-22 mins  Tyrone Schimke, OT Acute Rehabilitation Services Pager: [phone] Office: [phone]   Hortencia Pilar 01/13/2021, 1:35 PM

## 2021-01-13 NOTE — TOC Transition Note (Addendum)
Transition of Care [facility]) - CM/SW Discharge Note  Patient Details  Name: [name] MRN: [id number] Date of Birth: November 01, 1940  Transition of Care [facility]) CM/SW Contact:  Sherie Don, LCSW Phone Number: 01/13/2021, 10:53 AM  Clinical Narrative: CSW notified by hospitalist that patient's family was requesting a call from social work. CSW spoke with patient's husband, [name], and explained that patient is medically ready for discharge today. Husband asked about placement. CSW explained patient is not eligible for rehab (patient walked 70 ft with PT on 01/10/21 and walked 400 ft with a mobility specialist on 01/11/21) and if family cannot care for the patient at home, they would need to contact local LTC facilities to find an available Medicaid bed.  CSW called patient's niece, [name], who is also involved in the patient's care. Niece requested rehab for the patient. CSW explained that PT did not recommend rehab as patient is walking too far to be eligible. Niece expressed concerns about the patient having "episodes" at home when the patient is "unresponsive." CSW explained the family can pursue LTC Medicaid placement at a nursing facility. A list of facilities was printed and added to the discharge packet for the patient.  Niece asked about changing the PCS agency through the patient's Medicaid. TOC had this discussion with the family yesterday, but CSW reiterated again that it was set up through the patient's Medicaid caseworker and the family will need to ask the caseworker to assist with changing agencies. Per niece, patient's husband will transport the patient home as she is not eligible to go by PTAR (walked 400 ft). Patient has been set up with [facility], hospital bed, and OP palliative, so there are no other TOC needs at this time. CSW updated hospitalist and RN. TOC signing off.  Final next level of care: [place] Barriers to Discharge: Barriers Resolved  Patient Goals  and CMS Choice Patient states their goals for this hospitalization and ongoing recovery are:: To return back home with home health. CMS Medicare.gov Compare Post Acute Care list provided to:: Patient Choice offered to / list presented to : Patient  Discharge Plan and Services        DME Arranged: Hospital bed DME Agency: [facility] Date DME Agency Contacted: 01/11/21 Time DME Agency Contacted: [phone] Representative spoke with at DME Agency: [place]: PT, RN, Social Work [employer] Agency: [place] Date [place]: 01/11/21 Time [place]: 65 Representative spoke with at [place]: Tommi Rumps  Readmission Risk Interventions No flowsheet data found.

## 2021-01-14 ENCOUNTER — Other Ambulatory Visit: Payer: Self-pay

## 2021-01-14 DIAGNOSIS — G9341 Metabolic encephalopathy: Secondary | ICD-10-CM | POA: Diagnosis not present

## 2021-01-14 LAB — CULTURE, BLOOD (ROUTINE X 2)
Culture: NO GROWTH
Culture: NO GROWTH
Special Requests: ADEQUATE

## 2021-01-14 LAB — GLUCOSE, CAPILLARY
Glucose-Capillary: 119 mg/dL — ABNORMAL HIGH (ref 70–99)
Glucose-Capillary: 156 mg/dL — ABNORMAL HIGH (ref 70–99)
Glucose-Capillary: 95 mg/dL (ref 70–99)
Glucose-Capillary: 189 mg/dL — ABNORMAL HIGH (ref 70–99)

## 2021-01-14 NOTE — TOC Progression Note (Addendum)
Transition of Care [facility]) - Progression Note    Patient Details  Name: [name] MRN: [id number] Date of Birth: May 18, 1940  Transition of Care [facility]) CM/SW Contact  Joaquin Courts, RN Phone Number: 01/14/2021, 4:29 PM  Clinical Narrative:    CM reached out to patients niece to discuss need for patient to be picked up from hospital.  Niece states [facility].  CM made niece aware that [facility] is not able to accept any admissions at this time, Niece states " I gotta call someone" and hung up the phone.  CM called niece back, VM left.  CM reached out to patients spouse who states CM needs to speak with godddaughter Tabatha.  CM called number on chart, no answer and no options to leave VM.  Called spouse back and was provided with a new number to call for Tabatha [phone].  CM called and spoke with Tabatha who states that family will not be taking patient home, they cannot care for her due to her blood sugars dropping in the night and this is too stressful for her spouse.  Tabatha states plant to discharge to [facility].  CM explained to Tabatha that [facility] has a hold on all admissions due to a Covid outbreak in the facility.  [place] Sant[place] contradicts this, states she spoke with administrator and was promised a bed today or tomorrow morning.  CM explains that if patient is not picked up for [facility] today, patient will need to dc home and transition to [facility] from there, explained patient has no medical necessity to remain in the hospital.  CM questions if family can coordinate to help spouse in the interim, Tabatha states no as she has her own family to care for.  CM reached out to [facility] administrator Nicole Kindred to confirm information provided by [place].  Nicole Kindred states that admissions are on hold and no bed was promised to this family.  Nicole Kindred states further that in discussion with the admissions coordinator who reviewed the patients information it was found that  patient lacks a skillable need for SNF stay under her medicare benefit and further lacks a need for TLC SNF placement and would likely not qualify for a long term care payor source.  In light of all of this, [facility] administrator states that facility cannot accept this patient.  Nicole Kindred asked and agreeable to reach out to family and discuss this information.  CM followed up with a call to family, another VM left for Niece and unable to reach or leave VM for gaddaughter, Tabatha.  TOC will proceed with an APS report.      Barriers to Discharge: Barriers Resolved  Expected Discharge Plan and Services           Expected Discharge Date: 01/13/21               DME Arranged: Hospital bed DME Agency: [facility] Date DME Agency Contacted: 01/11/21 Time DME Agency Contacted: [phone] Representative spoke with at DME Agency: [place]: PT, RN, Social Work [employer] Agency: [place] Date [place]: 01/11/21 Time [place]: 9 Representative spoke with at [place]: [place] Interventions    Readmission Risk Interventions No flowsheet data found.

## 2021-01-14 NOTE — Clinical Social Work Note (Signed)
CSW called patient's niece, Irving Burton, today as patient was medically stable for discharge home yesterday. Per niece, the family is refusing to pick up the patient as the family "can't care for her at home." Niece hung up phone. TOC supervisor and Physician advisor aware.  Per chart review, family informed nursing staff that paperwork was to be signed for admission to  [facility] today. CSW contacted Martin Majestic in admissions at [facility] and was informed that all admissions are on hold for several days due to a COVID outbreak.  CSW spoke with RN for patient and was informed the family has not informed the patient that family is pursuing LTC. However, after reviewing flowsheets, patient was oriented x4 this morning.  TOC supervisor followed up with the administrator for [facility] and confirmed that the family has not been promised a bed and in reviewing the patient, she does not meet criteria for a long-term care bed.  CSW filed APS report with Denman George with [facility] APS.

## 2021-01-14 NOTE — Progress Notes (Addendum)
PROGRESS NOTE    [name]  LOV:[id number] DOB: 1940/12/09 DOA: 01/09/2021 PCP: Mike Craze, DO    Chief Complaint  Patient presents with   Failure To Thrive    Brief Narrative:   [name] is a 80 y.o. 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 As per god daughter, this is the second episode where she becomes unresponsive / lethargic after not drinking / eating for two days. Then she comes to ED, gets IV fluids and perks up and is back to baseline.  Pt seen and examined at bedside. Discussed with Dr Cheral Marker about the episodes, and work up so far has been negative.  Dr Cheral Marker recommended since  pt did not have any episodes of confusion or unresponsive episodes in the patient, suggested outpatient ambulatory EEG monitoring vs holter monitor as outpatient.  Discussed the results with the family, the god daughter , the husband and Denice over the phone. The husband reported that he cannot take her home and care for her. He is exhausted himself. They wanted to talk to the Social worker and see if his wife can go to long term or SNF.   Called [name] and her God daughter over the phone, . They are adamant that they want the patient to go to [facility] from the hospital, despite knowing the fact that there is COVID outbreak at the facility and the facility is not accepting any patients.   Unable to reach family when I called to update them this evening. Pt seen and examined and she is asking for her family and wanted to know when she can go home.   Assessment & Plan:   Principal Problem:   Acute metabolic encephalopathy Active Problems:   Gastric AVM   Type 2 diabetes mellitus without complication ([name]   HTN (hypertension), benign   Neuropathy   Seizures ([name]   Acute metabolic encephalopathy in  the setting of baseline dementia: Probably secondary to AKI/dehydration. She is back to baseline but has recent memory impairment.  Differentials include seizures. Initial CT of the head is negative for acute intracranial abnormality MRI of the brain without contrast ordered for further evaluation.  Since this is the second episode we will get EEG. EEG does not show any epileptiform activity.  Vitamin b12 is 450. TSH wnl.   History of GI bleed  secondary to gastric AV malformations Continue with PPI.    AKI on stage IIIa CKD Improved with IV fluids. Creatinine is 1.4, improving.     Type 2 diabetes mellitus Last A1c is 7.8.  Continue with SSI.  Restart home meds on discharge. No change in meds.  CBG (last 3)  Recent Labs    01/14/21 0726 01/14/21 1103 01/14/21 1657  GLUCAP 119* 156* 95      Dementia: Without any behavioral abnormalities.  Continue with Namenda.    Hypertension:  Well controlled.   Resume amlodipine.  Stop the Benicar.    H/o seizures: Continue with keppra  EEG does not show any epileptiform activity.     Diabetic neuropathy:  - continue with Gabapentin.   Mild thrombocytopenia:  Monitor as outpatient.  No bleeding episodes.    In view of her advanced age and multiple medical issues and readmissions, palliative care consulted    DVT prophylaxis: (Lovenox) Code Status: (Full code) Family Communication: none  at bedside.  Disposition:   Status is: Observation  The patient will require care spanning > 2 midnights and should be moved to inpatient because: unsafe d/c plan.       Consultants:  Palliative care.   Procedures: MRI brain without contrast.   Antimicrobials:NONE.    Subjective: No new complaints.   Objective: Vitals:   01/13/21 2107 01/14/21 0525 01/14/21 0922 01/14/21 1304  BP: (!) 117/50 (!) 106/55 112/64 (!) 123/49  Pulse: (!) 54 (!) 55  (!) 55  Resp: 18 14  14   Temp: 98.1 F (36.7 C) 98.5 F (36.9 C)   98.4 F (36.9 C)  TempSrc: Oral Oral  Oral  SpO2: 99% 100%  96%  Weight:      Height:        Intake/Output Summary (Last 24 hours) at 01/14/2021 1839 Last data filed at 01/14/2021 1400 Gross per 24 hour  Intake 1210 ml  Output 750 ml  Net 460 ml   Filed Weights   01/09/21 1347  Weight: 64.2 kg    Examination: General exam: Appears calm and comfortable  Respiratory system: Clear to auscultation. Respiratory effort normal. Cardiovascular system: S1 & S2 heard, RRR. No JVD, No pedal edema. Gastrointestinal system: Abdomen is nondistended, soft and nontender.  Normal bowel sounds heard. Central nervous system: Alert and oriented to person and place. No focal neurological deficits. Extremities: Symmetric 5 x 5 power. Skin: No rashes, lesions or ulcers Psychiatry:  Mood & affect appropriate.        Data Reviewed: I have personally reviewed following labs and imaging studies  CBC: Recent Labs  Lab 01/09/21 1353 01/10/21 0436 01/12/21 1415  WBC 6.0 4.7 5.5  NEUTROABS 3.4  --  3.6  HGB 14.3 11.2* 12.4  HCT 43.3 33.4* 35.9*  MCV 92.1 89.5 89.5  PLT 158 128* 253    Basic Metabolic Panel: Recent Labs  Lab 01/09/21 1353 01/10/21 0436 01/11/21 0240 01/12/21 1415  NA 141 143 138 138  K 4.8 4.1 4.5 4.5  CL 111 114* 110 109  CO2 19* 23 20* 24  GLUCOSE 192* 243* 237* 301*  BUN 24* 20 16 16   CREATININE 2.00* 1.63* 1.43* 1.46*  CALCIUM 9.5 8.6* 8.3* 9.1    GFR: Estimated Creatinine Clearance: 27.7 mL/min (A) (by C-G formula based on SCr of 1.46 mg/dL (H)).  Liver Function Tests: Recent Labs  Lab 01/09/21 1353  AST 24  ALT 17  ALKPHOS 128*  BILITOT 1.0  PROT 7.3  ALBUMIN 3.8    CBG: Recent Labs  Lab 01/13/21 1724 01/13/21 2102 01/14/21 0726 01/14/21 1103 01/14/21 1657  GLUCAP 292* 180* 119* 156* 95     Recent Results (from the past 240 hour(s))  Urine Culture     Status: None   Collection Time: 01/09/21  1:49 PM   Specimen: In/Out Cath Urine   Result Value Ref Range Status   Specimen Description   Final    IN/OUT CATH URINE Performed at [facility], [place] [address] [place], [place] [address]    Special Requests   Final    NONE Performed at [facility], [place] [address] [place], [place] [address]    Culture   Final    NO GROWTH Performed at [facility], [place] [address] [place], [place] [address]    Report Status 01/10/2021 FINAL  Final  Culture, blood (routine x 2)     Status: None   Collection Time: 01/09/21  1:53 PM   Specimen: BLOOD  Result Value Ref Range Status   Specimen Description   Final    BLOOD RIGHT ANTECUBITAL Performed at [facility] [address], [place], [place] [address]    Special Requests   Final    BOTTLES DRAWN AEROBIC AND ANAEROBIC Blood Culture results may not be optimal due to an inadequate volume of blood received in culture bottles Performed at [place] [address] [place], [place] [address]    Culture   Final    NO GROWTH 5 DAYS Performed at [facility], [place] [address], [place], [place] [address]    Report Status 01/14/2021 FINAL  Final  Resp Panel by RT-PCR (Flu A&B, Covid) Nasopharyngeal Swab     Status: None   Collection Time: 01/09/21  1:53 PM   Specimen: Nasopharyngeal Swab; Nasopharyngeal(NP) swabs in vial transport medium  Result Value Ref Range Status   SARS Coronavirus 2 by RT PCR NEGATIVE NEGATIVE Final    Comment: (NOTE) SARS-CoV-2 target nucleic acids are NOT DETECTED.  The SARS-CoV-2 RNA is generally detectable in upper respiratory specimens during the acute phase of infection. The lowest concentration of SARS-CoV-2 viral copies this assay can detect is 138 copies/mL. A negative result does not preclude SARS-Cov-2 infection and should not be used as the sole basis for treatment or other patient management decisions. A negative result may occur with  improper specimen  collection/handling, submission of specimen other than nasopharyngeal swab, presence of viral mutation(s) within the areas targeted by this assay, and inadequate number of viral copies(<138 copies/mL). A negative result must be combined with clinical observations, patient history, and epidemiological information. The expected result is Negative.  Fact Sheet for Patients:  EntrepreneurPulse.com.au  Fact Sheet for Healthcare Providers:  IncredibleEmployment.be  This test is no t yet approved or cleared by the [place] FDA and  has been authorized for detection and/or diagnosis of SARS-CoV-2 by FDA under an Emergency Use Authorization (EUA). This EUA will remain  in effect (meaning this test can be used) for the duration of the COVID-19 declaration under Section 564(b)(1) of the Act, 21 U.S.C.section 360bbb-3(b)(1), unless the authorization is terminated  or revoked sooner.       Influenza A by PCR NEGATIVE NEGATIVE Final   Influenza B by PCR NEGATIVE NEGATIVE Final    Comment: (NOTE) The Xpert Xpress SARS-CoV-2/FLU/RSV plus assay is intended as an aid in the diagnosis of influenza from Nasopharyngeal swab specimens and should not be used as a sole basis for treatment. Nasal washings and aspirates are unacceptable for Xpert Xpress SARS-CoV-2/FLU/RSV testing.  Fact Sheet for Patients: EntrepreneurPulse.com.au  Fact Sheet for Healthcare Providers: IncredibleEmployment.be  This test is not yet approved or cleared by the [place] FDA and has been authorized for detection and/or diagnosis of SARS-CoV-2 by FDA under an Emergency Use Authorization (EUA). This EUA will remain in effect (meaning this test can be used) for the duration of the COVID-19 declaration under Section 564(b)(1) of the Act, 21 U.S.C. section 360bbb-3(b)(1), unless the authorization is terminated or revoked.  Performed at [facility], [place] [address], [place], [place] [address]   Culture, blood (routine x 2)     Status: None   Collection Time: 01/09/21  1:54 PM   Specimen: BLOOD  Result Value Ref Range Status   Specimen Description   Final    BLOOD BLOOD RIGHT FOREARM Performed at [facility]  [address], [place], [place] [address]    Special Requests   Final    BOTTLES DRAWN AEROBIC AND ANAEROBIC Blood Culture adequate volume Performed at [place] [address], [place], [place] [address]    Culture   Final    NO GROWTH 5 DAYS Performed at [facility], [place] [address] [place], [place] [address]    Report Status 01/14/2021 FINAL  Final          Radiology Studies: No results found.      Scheduled Meds:  amLODipine  10 mg Oral q morning   Chlorhexidine Gluconate Cloth  6 each Topical Daily   enoxaparin (LOVENOX) injection  30 mg Subcutaneous Q24H   gabapentin  100 mg Oral BID   insulin aspart  0-9 Units Subcutaneous TID WC   insulin aspart  2 Units Subcutaneous TID WC   insulin aspart protamine- aspart  6 Units Subcutaneous BID WC   levETIRAcetam  500 mg Oral BID   memantine  10 mg Oral BID   multivitamin with minerals  1 tablet Oral Daily   pantoprazole  40 mg Oral BID   Continuous Infusions:     LOS: 0 days        Hosie Poisson, MD [facility]   To contact the attending provider between 7A-7P or the covering provider during after hours 7P-7A, please log into the web site [email or web address] and access using universal [place] password for that web site. If you do not have the password, please call the hospital operator.  01/14/2021, 6:39 PM

## 2021-01-14 NOTE — Plan of Care (Signed)

## 2021-01-15 DIAGNOSIS — I10 Essential (primary) hypertension: Secondary | ICD-10-CM

## 2021-01-15 DIAGNOSIS — G9341 Metabolic encephalopathy: Secondary | ICD-10-CM | POA: Diagnosis not present

## 2021-01-15 DIAGNOSIS — E119 Type 2 diabetes mellitus without complications: Secondary | ICD-10-CM

## 2021-01-15 DIAGNOSIS — K31819 Angiodysplasia of stomach and duodenum without bleeding: Secondary | ICD-10-CM

## 2021-01-15 DIAGNOSIS — N179 Acute kidney failure, unspecified: Secondary | ICD-10-CM | POA: Diagnosis not present

## 2021-01-15 DIAGNOSIS — E860 Dehydration: Secondary | ICD-10-CM | POA: Diagnosis not present

## 2021-01-15 DIAGNOSIS — G629 Polyneuropathy, unspecified: Secondary | ICD-10-CM

## 2021-01-15 DIAGNOSIS — R569 Unspecified convulsions: Secondary | ICD-10-CM

## 2021-01-15 LAB — GLUCOSE, CAPILLARY
Glucose-Capillary: 138 mg/dL — ABNORMAL HIGH (ref 70–99)
Glucose-Capillary: 316 mg/dL — ABNORMAL HIGH (ref 70–99)

## 2021-01-15 MED ORDER — AMLODIPINE BESYLATE 5 MG PO TABS: 2.5000 mg | ORAL_TABLET | Freq: Every morning | ORAL | Status: DC

## 2021-01-15 MED ORDER — AMLODIPINE BESYLATE 5 MG PO TABS: 5.0000 mg | ORAL_TABLET | Freq: Every morning | ORAL | Status: DC

## 2021-01-15 MED ORDER — HEPARIN SOD (PORK) LOCK FLUSH 100 UNIT/ML IV SOLN
500.0000 [IU] | INTRAVENOUS | Status: AC | PRN
  Administered 2021-01-15: 500 [IU]
  Filled 2021-01-15: qty 5

## 2021-01-15 MED ORDER — AMLODIPINE BESYLATE 2.5 MG PO TABS: 2.5000 mg | ORAL_TABLET | Freq: Every morning | ORAL | 1 refills | Status: DC

## 2021-01-15 NOTE — Progress Notes (Signed)
Pt alert and oriented at baseline. D/C instructions given to patient and Delsa Bern, goddaughter. Pt d/cd home.

## 2021-01-15 NOTE — Progress Notes (Signed)
PROGRESS NOTE    [name]  QTM:[id number] DOB: [birth year] DOA: 01/09/2021 PCP: Mike Craze, DO    Chief Complaint  Patient presents with   Failure To Thrive    Brief Narrative:  [name] is a 80 y.o. 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 As per god daughter, this is the second episode where she becomes unresponsive / lethargic after not drinking / eating for two days. Then she comes to ED, gets IV fluids and perks up and is back to baseline.  Pt seen and examined at bedside. Discussed with Dr Cheral Marker about the episodes, and work up so far has been negative.  Dr Cheral Marker recommended since  pt did not have any episodes of confusion or unresponsive episodes in the patient, suggested outpatient ambulatory EEG monitoring vs holter monitor as outpatient.  Discussed the results with the family, the god daughter , the husband and Denice over the phone. The husband reported that he cannot take her home and care for her. He is exhausted himself. They wanted to talk to the Social worker and see if his wife can go to long term or SNF.    Called [name] and her God daughter over the phone, . They are adamant that they want the patient to go to [facility] from the hospital, despite knowing the fact that there is COVID outbreak at the facility and the facility is not accepting any patients.    Unable to reach family when I called to update them this evening. Pt seen and examined and she is asking for her family and wanted to know when she can go home.      Assessment & Plan:   Principal Problem:   Acute metabolic encephalopathy Active Problems:   Gastric AVM   Type 2 diabetes mellitus without complication ([name]   HTN (hypertension), benign   Neuropathy   Seizures ([name]  #1 acute metabolic  encephalopathy in the setting of baseline dementia -Felt likely secondary to dehydration and AKI. -Work-up done of head CT with no acute intracranial abnormality. -MRI brain without any acute abnormalities. -EEG done with no epileptiform activity noted. -Vitamin B12 at 450, TSH within normal limits. -Patient's with no signs or symptoms of infection. -Patient hydrated with IV fluids improved clinically and back to baseline by day of discharge.  2.  History of GI bleed secondary to gastric AVMs -Remained stable. -Continue PPI.  3.  AKI on CKD stage IIIa -Improved with hydration.  4.  Diabetes mellitus type 2 with neuropathy -Hemoglobin A1c 7.8. -Patient maintained on sliding scale insulin. -Continue gabapentin. -Home regimen to be resumed on discharge.  5.  Dementia -Continue Namenda.  6.  Hypertension -Patient's Benicar held. -Patient's amlodipine resumed however dose to be decreased to 2.5 mg daily for better blood pressure control.  7.  History of seizures -EEG with no epileptiform activity noted. -Continue Keppra.  8.  Mild thrombocytopenia -No bleeding noted. -Outpatient follow-up with PCP   DVT prophylaxis: Lovenox Code Status: Full Family Communication: Updated husband, daughter at bedside Disposition:   Status is: Observation  The patient remains OBS appropriate and will d/c before 2 midnights.      Consultants:  Palliative care: Dr. Domingo Cocking 01/11/2021 [facility] neurology  Procedures:  CT head 01/09/2021 Chest x-ray 01/09/2021 MRI brain 01/12/2021 EEG 01/10/2021    Antimicrobials:  Anti-infectives (From admission, onward)    None         Subjective: Patient sitting up in chair, eating her lunch.  Denies any chest pain.  No shortness of breath.  No abdominal pain.  Asking whether she can go home today  Objective: Vitals:   01/14/21 2222 01/15/21 0138 01/15/21 0538 01/15/21 1103  BP: (!) 125/45 (!) 116/46 (!) 108/39 (!) 122/51  Pulse: (!)  50 (!) 53 (!) 52 (!) 50  Resp: 14 14 14    Temp: 98.7 F (37.1 C) 98.4 F (36.9 C) 98.5 F (36.9 C)   TempSrc: Oral Oral Oral   SpO2: 100% 100% 99%   Weight:      Height:        Intake/Output Summary (Last 24 hours) at 01/15/2021 1253 Last data filed at 01/15/2021 0900 Gross per 24 hour  Intake 600 ml  Output 250 ml  Net 350 ml   Filed Weights   01/09/21 1347  Weight: 64.2 kg    Examination:  General exam: NAD.  Poor dentition Respiratory system: Clear to auscultation. Respiratory effort normal. Cardiovascular system: S1 & S2 heard, RRR. No JVD, murmurs, rubs, gallops or clicks. No pedal edema. Gastrointestinal system: Abdomen is nondistended, soft and nontender. No organomegaly or masses felt. Normal bowel sounds heard. Central nervous system: Alert and oriented. No focal neurological deficits. Extremities: Symmetric 5 x 5 power. Skin: No rashes, lesions or ulcers Psychiatry: Judgement and insight appear normal. Mood & affect appropriate.     Data Reviewed: I have personally reviewed following labs and imaging studies  CBC: Recent Labs  Lab 01/09/21 1353 01/10/21 0436 01/12/21 1415  WBC 6.0 4.7 5.5  NEUTROABS 3.4  --  3.6  HGB 14.3 11.2* 12.4  HCT 43.3 33.4* 35.9*  MCV 92.1 89.5 89.5  PLT 158 128* 734    Basic Metabolic Panel: Recent Labs  Lab 01/09/21 1353 01/10/21 0436 01/11/21 0240 01/12/21 1415  NA 141 143 138 138  K 4.8 4.1 4.5 4.5  CL 111 114* 110 109  CO2 19* 23 20* 24  GLUCOSE 192* 243* 237* 301*  BUN 24* 20 16 16   CREATININE 2.00* 1.63* 1.43* 1.46*  CALCIUM 9.5 8.6* 8.3* 9.1    GFR: Estimated Creatinine Clearance: 27.7 mL/min (A) (by C-G formula based on SCr of 1.46 mg/dL (H)).  Liver Function Tests: Recent Labs  Lab 01/09/21 1353  AST 24  ALT 17  ALKPHOS 128*  BILITOT 1.0  PROT 7.3  ALBUMIN 3.8    CBG: Recent Labs  Lab 01/14/21 1103 01/14/21 1657 01/14/21 2219 01/15/21 0745 01/15/21 1133  GLUCAP 156* 95 189* 138*  316*     Recent Results (from the past 240 hour(s))  Urine Culture     Status: None   Collection Time: 01/09/21  1:49 PM   Specimen: In/Out Cath Urine  Result Value Ref Range Status   Specimen Description   Final    IN/OUT CATH URINE Performed at [facility], [place] [address] [place], [place] [address]    Special Requests   Final    NONE Performed at [facility], [place] [address], [place], [place] [address]    Culture   Final    NO GROWTH Performed at [facility], [place] [address] [place] [address], [place], [place] [address]    Report Status 01/10/2021 FINAL  Final  Culture, blood (routine x 2)     Status: None   Collection Time: 01/09/21  1:53 PM  Specimen: BLOOD  Result Value Ref Range Status   Specimen Description   Final    BLOOD RIGHT ANTECUBITAL Performed at [facility] [address] [place], [place] [address]    Special Requests   Final    BOTTLES DRAWN AEROBIC AND ANAEROBIC Blood Culture results may not be optimal due to an inadequate volume of blood received in culture bottles Performed at [place] [address], [place], [place] [address]    Culture   Final    NO GROWTH 5 DAYS Performed at [facility], [place] [address] [place], [place] [address]    Report Status 01/14/2021 FINAL  Final  Resp Panel by RT-PCR (Flu A&B, Covid) Nasopharyngeal Swab     Status: None   Collection Time: 01/09/21  1:53 PM   Specimen: Nasopharyngeal Swab; Nasopharyngeal(NP) swabs in vial transport medium  Result Value Ref Range Status   SARS Coronavirus 2 by RT PCR NEGATIVE NEGATIVE Final    Comment: (NOTE) SARS-CoV-2 target nucleic acids are NOT DETECTED.  The SARS-CoV-2 RNA is generally detectable in upper respiratory specimens during the acute phase of infection. The lowest concentration of SARS-CoV-2 viral copies this assay can detect is 138 copies/mL. A negative result does not preclude  SARS-Cov-2 infection and should not be used as the sole basis for treatment or other patient management decisions. A negative result may occur with  improper specimen collection/handling, submission of specimen other than nasopharyngeal swab, presence of viral mutation(s) within the areas targeted by this assay, and inadequate number of viral copies(<138 copies/mL). A negative result must be combined with clinical observations, patient history, and epidemiological information. The expected result is Negative.  Fact Sheet for Patients:  EntrepreneurPulse.com.au  Fact Sheet for Healthcare Providers:  IncredibleEmployment.be  This test is no t yet approved or cleared by the [place] FDA and  has been authorized for detection and/or diagnosis of SARS-CoV-2 by FDA under an Emergency Use Authorization (EUA). This EUA will remain  in effect (meaning this test can be used) for the duration of the COVID-19 declaration under Section 564(b)(1) of the Act, 21 U.S.C.section 360bbb-3(b)(1), unless the authorization is terminated  or revoked sooner.       Influenza A by PCR NEGATIVE NEGATIVE Final   Influenza B by PCR NEGATIVE NEGATIVE Final    Comment: (NOTE) The Xpert Xpress SARS-CoV-2/FLU/RSV plus assay is intended as an aid in the diagnosis of influenza from Nasopharyngeal swab specimens and should not be used as a sole basis for treatment. Nasal washings and aspirates are unacceptable for Xpert Xpress SARS-CoV-2/FLU/RSV testing.  Fact Sheet for Patients: EntrepreneurPulse.com.au  Fact Sheet for Healthcare Providers: IncredibleEmployment.be  This test is not yet approved or cleared by the [place] FDA and has been authorized for detection and/or diagnosis of SARS-CoV-2 by FDA under an Emergency Use Authorization (EUA). This EUA will remain in effect (meaning this test can be used) for the duration of  the COVID-19 declaration under Section 564(b)(1) of the Act, 21 U.S.C. section 360bbb-3(b)(1), unless the authorization is terminated or revoked.  Performed at [facility], [place] [address], [place], [place] [address]   Culture, blood (routine x 2)     Status: None   Collection Time: 01/09/21  1:54 PM   Specimen: BLOOD  Result Value Ref Range Status   Specimen Description   Final    BLOOD BLOOD RIGHT FOREARM Performed at [place] [address] [place], [place]  [address]    Special Requests   Final    BOTTLES DRAWN AEROBIC AND ANAEROBIC Blood Culture adequate volume Performed at [facility] [address], [place],  [place] [address]    Culture   Final    NO GROWTH 5 DAYS Performed at [facility], [place] [address], [place], [place] [address]    Report Status 01/14/2021 FINAL  Final         Radiology Studies: No results found.      Scheduled Meds:  [START ON 01/16/2021] amLODipine  2.5 mg Oral q morning   Chlorhexidine Gluconate Cloth  6 each Topical Daily   enoxaparin (LOVENOX) injection  30 mg Subcutaneous Q24H   gabapentin  100 mg Oral BID   insulin aspart  0-9 Units Subcutaneous TID WC   insulin aspart  2 Units Subcutaneous TID WC   insulin aspart protamine- aspart  6 Units Subcutaneous BID WC   levETIRAcetam  500 mg Oral BID   memantine  10 mg Oral BID   multivitamin with minerals  1 tablet Oral Daily   pantoprazole  40 mg Oral BID   Continuous Infusions:   LOS: 0 days    Time spent: 40 minutes    Irine Seal, MD [facility]   To contact the attending provider between 7A-7P or the covering provider during after hours 7P-7A, please log into the web site [email or web address] and access using universal [place] password for that web site. If you do not have the password, please call the hospital operator.  01/15/2021, 12:53 PM

## 2021-01-15 NOTE — Progress Notes (Signed)
Occupational Therapy Treatment Patient Details Name: [name] MRN: [id number] DOB: [birth year] Today's Date: 01/15/2021   History of present illness 80 y.o. F admitted on 01/09/21 due to AMS and increased lethargy.  Pt dx with dehydration, AKI, and metabolic encephalopathy.  PMH significant for Gastric Ulcer (11/17), GI Bleeds, Anemia, HTN, DM2, Seizures.   OT comments  Patient progressing towards goals. She did not require assistance to get out of the bed and overall min guard with use of RW to ambulate in room. Patient min assist for toileting because of hospital gown but otherwise about to perform without assistance. She was able to stand by pushing up using right hand on toilet as she reports chronic pain in left shoulder when attempting to use grab bar. She was able to locate soap, hand towel and trash can without verbal cues while perform standing grooming task. Continue to recommend [facility] with family supervision.   Recommendations for follow up therapy are one component of a multi-disciplinary discharge planning process, led by the attending physician.  Recommendations may be updated based on patient status, additional functional criteria and insurance authorization.    Follow Up Recommendations  Home health OT    Assistance Recommended at Discharge Set up Supervision/Assistance  Equipment Recommendations  None recommended by OT    Recommendations for Other Services      Precautions / Restrictions Precautions Precautions: Fall Restrictions Weight Bearing Restrictions: No       Mobility Bed Mobility Overal bed mobility: Modified Independent             General bed mobility comments: increased time to transfer to side of bed    Transfers Overall transfer level: Needs assistance Equipment used: Rolling walker (2 wheels) Transfers: Sit to/from Stand Sit to Stand: Supervision           General transfer comment: Min guard for overall ambulation in room and toilet  transfer with RW. No overt loss of balance.     Balance Overall balance assessment: Mild deficits observed, not formally tested                                         ADL either performed or assessed with clinical judgement   ADL Overall ADL's : Needs assistance/impaired     Grooming: Standing;Wash/dry hands Grooming Details (indicate cue type and reason): stood at sink to wash hands - located soap, hand towel and trash can without assitance                 Toilet Transfer: Min guard;Ambulation;Regular Glass blower/designer Details (indicate cue type and reason): reported some pain in left shoulder limiting her ability to use grab bar in bathroom for toilet transfer. Able to use right hand to push up from toilet Toileting- Clothing Manipulation and Hygiene: Min guard;Minimal assistance Toileting - Clothing Manipulation Details (indicate cue type and reason): min assist for management of hospital gown            Extremity/Trunk Assessment Upper Extremity Assessment Upper Extremity Assessment: LUE deficits/detail LUE Deficits / Details: Reports let shoulder pain that is chronic and had a corisone shot in the past            Vision   Vision Assessment?: No apparent visual deficits   Perception     Praxis      Cognition Arousal/Alertness: Awake/alert Behavior During Therapy: WFL for tasks  assessed/performed Overall Cognitive Status: No family/caregiver present to determine baseline cognitive functioning                                 General Comments: Patient alert to self. and following all instructions.          Exercises     Shoulder Instructions       General Comments      Pertinent Vitals/ Pain       Pain Assessment: Faces Faces Pain Scale: Hurts a [place] bit Pain Location: L shoulder Pain Descriptors / Indicators: Grimacing Pain Intervention(s): Monitored during session  Home Living                                           Prior Functioning/Environment              Frequency  Min 2X/week        Progress Toward Goals  OT Goals(current goals can now be found in the care plan section)  Progress towards OT goals: Progressing toward goals  Acute Rehab OT Goals Patient Stated Goal: wants to go home OT Goal Formulation: With patient Time For Goal Achievement: 01/24/21 Potential to Achieve Goals: Good  Plan Discharge plan remains appropriate    Co-evaluation                 AM-PAC OT "6 Clicks" Daily Activity     Outcome Measure   Help from another person eating meals?: None Help from another person taking care of personal grooming?: None Help from another person toileting, which includes using toliet, bedpan, or urinal?: A [place] Help from another person bathing (including washing, rinsing, drying)?: A [place] Help from another person to put on and taking off regular upper body clothing?: A [place] Help from another person to put on and taking off regular lower body clothing?: A [place] 6 Click Score: 20    End of Session Equipment Utilized During Treatment: Rolling walker (2 wheels)  OT Visit Diagnosis: Unsteadiness on feet (R26.81);Other abnormalities of gait and mobility (R26.89);Muscle weakness (generalized) (M62.81)   Activity Tolerance Patient tolerated treatment well   Patient Left in chair;with call bell/phone within reach (chair alarm dead - RN and tech notified)   Nurse Communication Mobility status        Time: [phone] OT Time Calculation (min): 24 min  Charges: OT General Charges $OT Visit: 1 Visit OT Treatments $Self Care/Home Management : 23-37 mins  Marilyn Nihiser, OTR/L [place]  Office [phone] Pager: [place] 01/15/2021, 10:23 AM

## 2021-01-15 NOTE — TOC Progression Note (Signed)
Transition of Care [facility]) - Progression Note   Patient Details  Name: [name] MRN: [id number] Date of Birth: March 04, 1940  Transition of Care [facility]) CM/SW [place], LCSW Phone Number: 01/15/2021, 12:35 PM  Clinical Narrative: CSW received voicemail from [facility] that a case would not be opened on the patient.  CSW received call from [facility] with [facility] requesting additional information regarding the patient stating, "there was more to the story" than what the family told her. CSW informed [name] the patient was discharged on 01/13/21 and family has refused to pick her up and that CSW explained to the niece, [name], that any type of LTC placement would need to be done by the family after discharge.  [name] was also informed that family has not been answering or returning many of the hospital's calls to family. Per [name], the patient's goddaughter, [name], told her no one from the hospital has been reaching out to family.  CSW explained that patient did not meet criteria for rehab or LTC at a SNF due to her level of functioning and that the family has not informed the patient they are pursuing placement. [name] reported memory care had been discussed with the family, but she was not aware the patient is oriented x3-4 at baseline and has been for several days.  [name] reported she told the family she could not admit the patient after hours when the family called yesterday because they would need to complete the application as well as any required tests (such as a TB skin test), which will take time. [name] asked for an FL2. CSW explained that an FL2 will not be provided as the patient was discharged two days ago and the only reason the patient is here is because the family is refusing to pick her up. [name] to follow up with family.  Barriers to Discharge: Family Issues, Other (must enter comment) (Family refusing to pick up  patient)  Expected Discharge Plan and Services Expected Discharge Date: 01/13/21               DME Arranged: Hospital bed DME Agency: [employer] Date DME Agency Contacted: 01/11/21 Time DME Agency Contacted: [phone] Representative spoke with at DME Agency: [place]: PT, RN, Social Work [place] Agency: [place] Date [place]: 01/11/21 Time [place]: 12 Representative spoke with at [place]: [name]  Readmission Risk Interventions No flowsheet data found.

## 2021-01-17 ENCOUNTER — Telehealth: Payer: Self-pay

## 2021-01-17 ENCOUNTER — Other Ambulatory Visit (HOSPITAL_BASED_OUTPATIENT_CLINIC_OR_DEPARTMENT_OTHER): Payer: Self-pay | Admitting: Physician Assistant

## 2021-01-17 ENCOUNTER — Other Ambulatory Visit: Payer: Self-pay

## 2021-01-17 ENCOUNTER — Inpatient Hospital Stay: Payer: Medicare Other

## 2021-01-17 ENCOUNTER — Inpatient Hospital Stay: Payer: Medicare Other | Attending: Hematology

## 2021-01-17 VITALS — BP 137/64 | HR 54 | Temp 98.3°F | Resp 18 | Wt 141.0 lb

## 2021-01-17 DIAGNOSIS — Z87891 Personal history of nicotine dependence: Secondary | ICD-10-CM

## 2021-01-17 DIAGNOSIS — Z833 Family history of diabetes mellitus: Secondary | ICD-10-CM

## 2021-01-17 DIAGNOSIS — N1831 Chronic kidney disease, stage 3a: Secondary | ICD-10-CM | POA: Diagnosis present

## 2021-01-17 DIAGNOSIS — K2970 Gastritis, unspecified, without bleeding: Secondary | ICD-10-CM | POA: Diagnosis present

## 2021-01-17 DIAGNOSIS — Z8249 Family history of ischemic heart disease and other diseases of the circulatory system: Secondary | ICD-10-CM

## 2021-01-17 DIAGNOSIS — H9190 Unspecified hearing loss, unspecified ear: Secondary | ICD-10-CM | POA: Diagnosis present

## 2021-01-17 DIAGNOSIS — K254 Chronic or unspecified gastric ulcer with hemorrhage: Secondary | ICD-10-CM | POA: Diagnosis not present

## 2021-01-17 DIAGNOSIS — Z794 Long term (current) use of insulin: Secondary | ICD-10-CM

## 2021-01-17 DIAGNOSIS — Z8701 Personal history of pneumonia (recurrent): Secondary | ICD-10-CM

## 2021-01-17 DIAGNOSIS — E1140 Type 2 diabetes mellitus with diabetic neuropathy, unspecified: Secondary | ICD-10-CM | POA: Diagnosis present

## 2021-01-17 DIAGNOSIS — Z5111 Encounter for antineoplastic chemotherapy: Secondary | ICD-10-CM | POA: Insufficient documentation

## 2021-01-17 DIAGNOSIS — Z20822 Contact with and (suspected) exposure to covid-19: Secondary | ICD-10-CM | POA: Diagnosis present

## 2021-01-17 DIAGNOSIS — E785 Hyperlipidemia, unspecified: Secondary | ICD-10-CM | POA: Diagnosis present

## 2021-01-17 DIAGNOSIS — I272 Pulmonary hypertension, unspecified: Secondary | ICD-10-CM | POA: Diagnosis present

## 2021-01-17 DIAGNOSIS — R638 Other symptoms and signs concerning food and fluid intake: Secondary | ICD-10-CM

## 2021-01-17 DIAGNOSIS — K922 Gastrointestinal hemorrhage, unspecified: Secondary | ICD-10-CM | POA: Diagnosis not present

## 2021-01-17 DIAGNOSIS — E1122 Type 2 diabetes mellitus with diabetic chronic kidney disease: Secondary | ICD-10-CM | POA: Diagnosis present

## 2021-01-17 DIAGNOSIS — Z79899 Other long term (current) drug therapy: Secondary | ICD-10-CM

## 2021-01-17 DIAGNOSIS — M8580 Other specified disorders of bone density and structure, unspecified site: Secondary | ICD-10-CM | POA: Diagnosis present

## 2021-01-17 DIAGNOSIS — K08409 Partial loss of teeth, unspecified cause, unspecified class: Secondary | ICD-10-CM | POA: Diagnosis present

## 2021-01-17 DIAGNOSIS — I780 Hereditary hemorrhagic telangiectasia: Secondary | ICD-10-CM

## 2021-01-17 DIAGNOSIS — Z95828 Presence of other vascular implants and grafts: Secondary | ICD-10-CM

## 2021-01-17 DIAGNOSIS — N179 Acute kidney failure, unspecified: Secondary | ICD-10-CM | POA: Diagnosis present

## 2021-01-17 DIAGNOSIS — G40909 Epilepsy, unspecified, not intractable, without status epilepticus: Secondary | ICD-10-CM | POA: Diagnosis present

## 2021-01-17 DIAGNOSIS — F0390 Unspecified dementia without behavioral disturbance: Secondary | ICD-10-CM | POA: Diagnosis present

## 2021-01-17 DIAGNOSIS — D62 Acute posthemorrhagic anemia: Secondary | ICD-10-CM | POA: Diagnosis present

## 2021-01-17 DIAGNOSIS — E1165 Type 2 diabetes mellitus with hyperglycemia: Secondary | ICD-10-CM | POA: Diagnosis present

## 2021-01-17 DIAGNOSIS — D500 Iron deficiency anemia secondary to blood loss (chronic): Secondary | ICD-10-CM | POA: Insufficient documentation

## 2021-01-17 DIAGNOSIS — L439 Lichen planus, unspecified: Secondary | ICD-10-CM | POA: Diagnosis present

## 2021-01-17 DIAGNOSIS — Z886 Allergy status to analgesic agent status: Secondary | ICD-10-CM

## 2021-01-17 DIAGNOSIS — K31819 Angiodysplasia of stomach and duodenum without bleeding: Secondary | ICD-10-CM

## 2021-01-17 DIAGNOSIS — I130 Hypertensive heart and chronic kidney disease with heart failure and stage 1 through stage 4 chronic kidney disease, or unspecified chronic kidney disease: Secondary | ICD-10-CM | POA: Diagnosis present

## 2021-01-17 DIAGNOSIS — I480 Paroxysmal atrial fibrillation: Secondary | ICD-10-CM | POA: Diagnosis present

## 2021-01-17 DIAGNOSIS — E871 Hypo-osmolality and hyponatremia: Secondary | ICD-10-CM | POA: Diagnosis present

## 2021-01-17 DIAGNOSIS — K219 Gastro-esophageal reflux disease without esophagitis: Secondary | ICD-10-CM | POA: Diagnosis present

## 2021-01-17 DIAGNOSIS — Z7985 Long-term (current) use of injectable non-insulin antidiabetic drugs: Secondary | ICD-10-CM

## 2021-01-17 DIAGNOSIS — I5032 Chronic diastolic (congestive) heart failure: Secondary | ICD-10-CM | POA: Diagnosis present

## 2021-01-17 LAB — CMP (CANCER CENTER ONLY)
ALT: 34 U/L (ref 0–44)
AST: 41 U/L (ref 15–41)
Albumin: 3.2 g/dL — ABNORMAL LOW (ref 3.5–5.0)
Alkaline Phosphatase: 146 U/L — ABNORMAL HIGH (ref 38–126)
Anion gap: 8 (ref 5–15)
BUN: 13 mg/dL (ref 8–23)
CO2: 24 mmol/L (ref 22–32)
Calcium: 9.4 mg/dL (ref 8.9–10.3)
Chloride: 102 mmol/L (ref 98–111)
Creatinine: 1.38 mg/dL — ABNORMAL HIGH (ref 0.44–1.00)
GFR, Estimated: 39 mL/min — ABNORMAL LOW (ref >60)
Glucose, Bld: 90 mg/dL (ref 70–99)
Potassium: 4.8 mmol/L (ref 3.5–5.1)
Sodium: 134 mmol/L — ABNORMAL LOW (ref 135–145)
Total Bilirubin: 0.8 mg/dL (ref 0.3–1.2)
Total Protein: 6.5 g/dL (ref 6.5–8.1)

## 2021-01-17 LAB — CBC WITH DIFFERENTIAL (CANCER CENTER ONLY)
Abs Immature Granulocytes: 0.03 10*3/uL (ref 0.00–0.07)
Basophils Absolute: 0 10*3/uL (ref 0.0–0.1)
Basophils Relative: 1 %
Eosinophils Absolute: 0.5 10*3/uL (ref 0.0–0.5)
Eosinophils Relative: 7 %
HCT: 33.9 % — ABNORMAL LOW (ref 36.0–46.0)
Hemoglobin: 12.1 g/dL (ref 12.0–15.0)
Immature Granulocytes: 0 %
Lymphocytes Relative: 12 %
Lymphs Abs: 0.9 10*3/uL (ref 0.7–4.0)
MCH: 30.3 pg (ref 26.0–34.0)
MCHC: 35.7 g/dL (ref 30.0–36.0)
MCV: 85 fL (ref 80.0–100.0)
Monocytes Absolute: 0.7 10*3/uL (ref 0.1–1.0)
Monocytes Relative: 9 %
Neutro Abs: 5.2 10*3/uL (ref 1.7–7.7)
Neutrophils Relative %: 71 %
Platelet Count: 142 10*3/uL — ABNORMAL LOW (ref 150–400)
RBC: 3.99 MIL/uL (ref 3.87–5.11)
RDW: 13.5 % (ref 11.5–15.5)
WBC Count: 7.3 10*3/uL (ref 4.0–10.5)
nRBC: 0 % (ref 0.0–0.2)

## 2021-01-17 LAB — TOTAL PROTEIN, URINE DIPSTICK: Protein, ur: NEGATIVE mg/dL

## 2021-01-17 MED ORDER — SODIUM CHLORIDE 0.9 % IV SOLN
4.6000 mg/kg | Freq: Once | INTRAVENOUS | Status: AC
  Administered 2021-01-17: 300 mg via INTRAVENOUS
  Filled 2021-01-17: qty 12

## 2021-01-17 MED ORDER — SODIUM CHLORIDE 0.9% FLUSH
10.0000 mL | INTRAVENOUS | Status: DC | PRN
  Administered 2021-01-17: 10 mL

## 2021-01-17 MED ORDER — HEPARIN SOD (PORK) LOCK FLUSH 100 UNIT/ML IV SOLN
500.0000 [IU] | Freq: Once | INTRAVENOUS | Status: AC | PRN
  Administered 2021-01-17: 500 [IU]

## 2021-01-17 MED ORDER — SODIUM CHLORIDE 0.9 % IV SOLN: Freq: Once | INTRAVENOUS | Status: AC

## 2021-01-17 NOTE — Telephone Encounter (Signed)
Spoke with patient's niece [name] and scheduled an in-person Palliative Consult for 02/05/21 @ 2:30 PM with Dr. Hollace Kinnier. Documentation will be noted in [place].   COVID screening was negative. No pets in home. Patient lives with husband.  Consent obtained; updated Outlook/Netsmart/Team List and Epic.   Family is aware they may be receiving a call from provider the day before or day of to confirm appointment.

## 2021-01-17 NOTE — Progress Notes (Signed)
Per daughter pt is experiencing a decrease in PO intake at home. Daughter is concerned about dehydration and avoiding another hospital admission.  RN reached out to [place], [place] in symptom management. RN received an order for a 560mL/hr bolus of 0.9% normal saline for one hour.

## 2021-01-17 NOTE — Patient Instructions (Signed)
[facility] ONCOLOGY  Discharge Instructions: Thank you for choosing [facility] to provide your oncology and hematology care.   If you have a lab appointment with the [facility], please go directly to the [facility] and check in at the registration area.   Wear comfortable clothing and clothing appropriate for easy access to any Portacath or PICC line.   We strive to give you quality time with your provider. You may need to reschedule your appointment if you arrive late (15 or more minutes).  Arriving late affects you and other patients whose appointments are after yours.  Also, if you miss three or more appointments without notifying the office, you may be dismissed from the clinic at the provider's discretion.      For prescription refill requests, have your pharmacy contact our office and allow 72 hours for refills to be completed.    Today you received the following chemotherapy and/or immunotherapy agents: MVASI   To help prevent nausea and vomiting after your treatment, we encourage you to take your nausea medication as directed.  BELOW ARE SYMPTOMS THAT SHOULD BE REPORTED IMMEDIATELY: *FEVER GREATER THAN 100.4 F (38 C) OR HIGHER *CHILLS OR SWEATING *NAUSEA AND VOMITING THAT IS NOT CONTROLLED WITH YOUR NAUSEA MEDICATION *UNUSUAL SHORTNESS OF BREATH *UNUSUAL BRUISING OR BLEEDING *URINARY PROBLEMS (pain or burning when urinating, or frequent urination) *BOWEL PROBLEMS (unusual diarrhea, constipation, pain near the anus) TENDERNESS IN MOUTH AND THROAT WITH OR WITHOUT PRESENCE OF ULCERS (sore throat, sores in mouth, or a toothache) UNUSUAL RASH, SWELLING OR PAIN  UNUSUAL VAGINAL DISCHARGE OR ITCHING   Items with * indicate a potential emergency and should be followed up as soon as possible or go to the Emergency Department if any problems should occur.  Please show the CHEMOTHERAPY ALERT CARD or IMMUNOTHERAPY ALERT CARD at check-in to the  Emergency Department and triage nurse.  Should you have questions after your visit or need to cancel or reschedule your appointment, please contact [facility]  Dept: [phone]  and follow the prompts.  Office hours are 8:00 a.m. to 4:30 p.m. Monday - Friday. Please note that voicemails left after 4:00 p.m. may not be returned until the following business day.  We are closed weekends and major holidays. You have access to a nurse at all times for urgent questions. Please call the main number to the clinic Dept: [phone] and follow the prompts.   For any non-urgent questions, you may also contact your provider using MyChart. We now offer e-Visits for anyone 58 and older to request care online for non-urgent symptoms. For details visit [email or web address].   Also download the MyChart app! Go to the app store, search "MyChart", open the app, select [facility], and log in with your MyChart username and password.  Due to Covid, a mask is required upon entering the hospital/clinic. If you do not have a mask, one will be given to you upon arrival. For doctor visits, patients may have 1 support person aged 80 or older with them. For treatment visits, patients cannot have anyone with them due to current Covid guidelines and our immunocompromised population.   Bevacizumab injection What is this medication? BEVACIZUMAB (be va SIZ yoo mab) is a monoclonal antibody. It is used to treat many types of cancer. This medicine may be used for other purposes; ask your health care provider or pharmacist if you have questions. COMMON BRAND NAME(S): Alymsys, Avastin, MVASI, Zirabev What  should I tell my care team before I take this medication? They need to know if you have any of these conditions: diabetes heart disease high blood pressure history of coughing up blood prior anthracycline chemotherapy (e.g., doxorubicin, daunorubicin, epirubicin) recent or ongoing radiation  therapy recent or planning to have surgery stroke an unusual or allergic reaction to bevacizumab, hamster proteins, mouse proteins, other medicines, foods, dyes, or preservatives pregnant or trying to get pregnant breast-feeding How should I use this medication? This medicine is for infusion into a vein. It is given by a health care professional in a hospital or clinic setting. Talk to your pediatrician regarding the use of this medicine in children. Special care may be needed. Overdosage: If you think you have taken too much of this medicine contact a poison control center or emergency room at once. NOTE: This medicine is only for you. Do not share this medicine with others. What if I miss a dose? It is important not to miss your dose. Call your doctor or health care professional if you are unable to keep an appointment. What may interact with this medication? Interactions are not expected. This list may not describe all possible interactions. Give your health care provider a list of all the medicines, herbs, non-prescription drugs, or dietary supplements you use. Also tell them if you smoke, drink alcohol, or use illegal drugs. Some items may interact with your medicine. What should I watch for while using this medication? Your condition will be monitored carefully while you are receiving this medicine. You will need important blood work and urine testing done while you are taking this medicine. This medicine may increase your risk to bruise or bleed. Call your doctor or health care professional if you notice any unusual bleeding. Before having surgery, talk to your health care provider to make sure it is ok. This drug can increase the risk of poor healing of your surgical site or wound. You will need to stop this drug for 28 days before surgery. After surgery, wait at least 28 days before restarting this drug. Make sure the surgical site or wound is healed enough before restarting this drug.  Talk to your health care provider if questions. Do not become pregnant while taking this medicine or for 6 months after stopping it. Women should inform their doctor if they wish to become pregnant or think they might be pregnant. There is a potential for serious side effects to an unborn child. Talk to your health care professional or pharmacist for more information. Do not breast-feed an infant while taking this medicine and for 6 months after the last dose. This medicine has caused ovarian failure in some women. This medicine may interfere with the ability to have a child. You should talk to your doctor or health care professional if you are concerned about your fertility. What side effects may I notice from receiving this medication? Side effects that you should report to your doctor or health care professional as soon as possible: allergic reactions like skin rash, itching or hives, swelling of the face, lips, or tongue chest pain or chest tightness chills coughing up blood high fever seizures severe constipation signs and symptoms of bleeding such as bloody or black, tarry stools; red or dark-brown urine; spitting up blood or brown material that looks like coffee grounds; red spots on the skin; unusual bruising or bleeding from the eye, gums, or nose signs and symptoms of a blood clot such as breathing problems; chest pain; severe,  sudden headache; pain, swelling, warmth in the leg signs and symptoms of a stroke like changes in vision; confusion; trouble speaking or understanding; severe headaches; sudden numbness or weakness of the face, arm or leg; trouble walking; dizziness; loss of balance or coordination stomach pain sweating swelling of legs or ankles vomiting weight gain Side effects that usually do not require medical attention (report to your doctor or health care professional if they continue or are bothersome): back pain changes in taste decreased appetite dry  skin nausea tiredness This list may not describe all possible side effects. Call your doctor for medical advice about side effects. You may report side effects to FDA at 1-800-FDA-1088. Where should I keep my medication? This drug is given in a hospital or clinic and will not be stored at home. NOTE: This sheet is a summary. It may not cover all possible information. If you have questions about this medicine, talk to your doctor, pharmacist, or health care provider.  2022 Elsevier/Gold Standard (2020-10-15 00:00:00)  Rehydration, Adult Rehydration is the replacement of body fluids, salts, and minerals (electrolytes) that are lost during dehydration. Dehydration is when there is not enough water or other fluids in the body. This happens when you lose more fluids than you take in. Common causes of dehydration include: Not drinking enough fluids. This can occur when you are ill or doing activities that require a lot of energy, especially in hot weather. Conditions that cause loss of water or other fluids, such as diarrhea, vomiting, sweating, or urinating a lot. Other illnesses, such as fever or infection. Certain medicines, such as those that remove excess fluid from the body (diuretics). Symptoms of mild or moderate dehydration may include thirst, dry lips and mouth, and dizziness. Symptoms of severe dehydration may include increased heart rate, confusion, fainting, and not urinating. For severe dehydration, you may need to get fluids through an IV at the hospital. For mild or moderate dehydration, you can usually rehydrate at home by drinking certain fluids as told by your health care provider. What are the risks? Generally, rehydration is safe. However, taking in too much fluid (overhydration) can be a problem. This is rare. Overhydration can cause an electrolyte imbalance, kidney failure, or a decrease in salt (sodium) levels in the body. Supplies needed You will need an oral rehydration  solution (ORS) if your health care provider tells you to use one. This is a drink to treat dehydration. It can be found in pharmacies and retail stores. How to rehydrate Fluids Follow instructions from your health care provider for rehydration. The kind of fluid and the amount you should drink depend on your condition. In general, you should choose drinks that you prefer. If told by your health care provider, drink an ORS. Make an ORS by following instructions on the package. Start by drinking small amounts, about  cup (120 mL) every 5-10 minutes. Slowly increase how much you drink until you have taken the amount recommended by your health care provider. Drink enough clear fluids to keep your urine pale yellow. If you were told to drink an ORS, finish it first, then start slowly drinking other clear fluids. Drink fluids such as: Water. This includes sparkling water and flavored water. Drinking only water can lead to having too [place] sodium in your body (hyponatremia). Follow the advice of your health care provider. Water from ice chips you suck on. Fruit juice with water you add to it (diluted). Sports drinks. Hot or cold herbal teas. Broth-based soups.  Milk or milk products. Food Follow instructions from your health care provider about what to eat while you rehydrate. Your health care provider may recommend that you slowly begin eating regular foods in small amounts. Eat foods that contain a healthy balance of electrolytes, such as bananas, oranges, potatoes, tomatoes, and spinach. Avoid foods that are greasy or contain a lot of sugar. In some cases, you may get nutrition through a feeding tube that is passed through your nose and into your stomach (nasogastric tube, or NG tube). This may be done if you have uncontrolled vomiting or diarrhea. Beverages to avoid Certain beverages may make dehydration worse. While you rehydrate, avoid drinking alcohol. How to tell if you are recovering from  dehydration You may be recovering from dehydration if: You are urinating more often than before you started rehydrating. Your urine is pale yellow. Your energy level improves. You vomit less frequently. You have diarrhea less frequently. Your appetite improves or returns to normal. You feel less dizzy or less light-headed. Your skin tone and color start to look more normal. Follow these instructions at home: Take over-the-counter and prescription medicines only as told by your health care provider. Do not take sodium tablets. Doing this can lead to having too much sodium in your body (hypernatremia). Contact a health care provider if: You continue to have symptoms of mild or moderate dehydration, such as: Thirst. Dry lips. Slightly dry mouth. Dizziness. Dark urine or less urine than normal. Muscle cramps. You continue to vomit or have diarrhea. Get help right away if you: Have symptoms of dehydration that get worse. Have a fever. Have a severe headache. Have been vomiting and the following happens: Your vomiting gets worse or does not go away. Your vomit includes blood or green matter (bile). You cannot eat or drink without vomiting. Have problems with urination or bowel movements, such as: Diarrhea that gets worse or does not go away. Blood in your stool (feces). This may cause stool to look black and tarry. Not urinating, or urinating only a small amount of very dark urine, within 6-8 hours. Have trouble breathing. Have symptoms that get worse with treatment. These symptoms may represent a serious problem that is an emergency. Do not wait to see if the symptoms will go away. Get medical help right away. Call your local emergency services (911 in the [place] Do not drive yourself to the hospital. Summary Rehydration is the replacement of body fluids and minerals (electrolytes) that are lost during dehydration. Follow instructions from your health care provider for rehydration.  The kind of fluid and amount you should drink depend on your condition. Slowly increase how much you drink until you have taken the amount recommended by your health care provider. Contact your health care provider if you continue to show signs of mild or moderate dehydration. This information is not intended to replace advice given to you by your health care provider. Make sure you discuss any questions you have with your health care provider. Document Revised: 03/29/2019 Document Reviewed: 02/06/2019 Elsevier Patient Education  2022 [employer].

## 2021-01-18 ENCOUNTER — Other Ambulatory Visit: Payer: Self-pay

## 2021-01-18 ENCOUNTER — Encounter (HOSPITAL_COMMUNITY): Payer: Self-pay | Admitting: *Deleted

## 2021-01-18 ENCOUNTER — Inpatient Hospital Stay (HOSPITAL_COMMUNITY)
Admission: EM | Admit: 2021-01-18 | Discharge: 2021-01-22 | DRG: 378 | Disposition: A | Discharge status: Skilled Nursing Facility | Payer: Medicare Other | Attending: Internal Medicine | Admitting: Internal Medicine

## 2021-01-18 DIAGNOSIS — K922 Gastrointestinal hemorrhage, unspecified: Principal | ICD-10-CM

## 2021-01-18 DIAGNOSIS — E119 Type 2 diabetes mellitus without complications: Secondary | ICD-10-CM

## 2021-01-18 DIAGNOSIS — N183 Chronic kidney disease, stage 3 unspecified: Secondary | ICD-10-CM | POA: Diagnosis present

## 2021-01-18 DIAGNOSIS — Z794 Long term (current) use of insulin: Secondary | ICD-10-CM

## 2021-01-18 DIAGNOSIS — I780 Hereditary hemorrhagic telangiectasia: Secondary | ICD-10-CM | POA: Diagnosis present

## 2021-01-18 DIAGNOSIS — I480 Paroxysmal atrial fibrillation: Secondary | ICD-10-CM | POA: Diagnosis present

## 2021-01-18 DIAGNOSIS — Z8719 Personal history of other diseases of the digestive system: Secondary | ICD-10-CM | POA: Diagnosis present

## 2021-01-18 LAB — CBC WITH DIFFERENTIAL/PLATELET
Abs Immature Granulocytes: 0.1 10*3/uL — ABNORMAL HIGH (ref 0.00–0.07)
Basophils Absolute: 0 10*3/uL (ref 0.0–0.1)
Basophils Relative: 0 %
Eosinophils Absolute: 0.2 10*3/uL (ref 0.0–0.5)
Eosinophils Relative: 1 %
HCT: 27.7 % — ABNORMAL LOW (ref 36.0–46.0)
Hemoglobin: 9.8 g/dL — ABNORMAL LOW (ref 12.0–15.0)
Immature Granulocytes: 1 %
Lymphocytes Relative: 6 %
Lymphs Abs: 0.7 10*3/uL (ref 0.7–4.0)
MCH: 31.4 pg (ref 26.0–34.0)
MCHC: 35.4 g/dL (ref 30.0–36.0)
MCV: 88.8 fL (ref 80.0–100.0)
Monocytes Absolute: 1 10*3/uL (ref 0.1–1.0)
Monocytes Relative: 8 %
Neutro Abs: 10.4 10*3/uL — ABNORMAL HIGH (ref 1.7–7.7)
Neutrophils Relative %: 84 %
Platelets: 147 10*3/uL — ABNORMAL LOW (ref 150–400)
RBC: 3.12 MIL/uL — ABNORMAL LOW (ref 3.87–5.11)
RDW: 13.8 % (ref 11.5–15.5)
WBC: 12.4 10*3/uL — ABNORMAL HIGH (ref 4.0–10.5)
nRBC: 0 % (ref 0.0–0.2)

## 2021-01-18 LAB — COMPREHENSIVE METABOLIC PANEL
ALT: 27 U/L (ref 0–44)
AST: 35 U/L (ref 15–41)
Albumin: 2.6 g/dL — ABNORMAL LOW (ref 3.5–5.0)
Alkaline Phosphatase: 108 U/L (ref 38–126)
Anion gap: 13 (ref 5–15)
BUN: 18 mg/dL (ref 8–23)
CO2: 20 mmol/L — ABNORMAL LOW (ref 22–32)
Calcium: 8.7 mg/dL — ABNORMAL LOW (ref 8.9–10.3)
Chloride: 101 mmol/L (ref 98–111)
Creatinine, Ser: 1.72 mg/dL — ABNORMAL HIGH (ref 0.44–1.00)
GFR, Estimated: 30 mL/min — ABNORMAL LOW (ref >60)
Glucose, Bld: 297 mg/dL — ABNORMAL HIGH (ref 70–99)
Potassium: 5.1 mmol/L (ref 3.5–5.1)
Sodium: 134 mmol/L — ABNORMAL LOW (ref 135–145)
Total Bilirubin: 0.8 mg/dL (ref 0.3–1.2)
Total Protein: 5.1 g/dL — ABNORMAL LOW (ref 6.5–8.1)

## 2021-01-18 LAB — CBC
HCT: 21.2 % — ABNORMAL LOW (ref 36.0–46.0)
Hemoglobin: 7.4 g/dL — ABNORMAL LOW (ref 12.0–15.0)
MCH: 31.6 pg (ref 26.0–34.0)
MCHC: 34.9 g/dL (ref 30.0–36.0)
MCV: 90.6 fL (ref 80.0–100.0)
Platelets: 113 10*3/uL — ABNORMAL LOW (ref 150–400)
RBC: 2.34 MIL/uL — ABNORMAL LOW (ref 3.87–5.11)
RDW: 13.9 % (ref 11.5–15.5)
WBC: 7.2 10*3/uL (ref 4.0–10.5)
nRBC: 0 % (ref 0.0–0.2)

## 2021-01-18 LAB — RESP PANEL BY RT-PCR (FLU A&B, COVID) ARPGX2
Influenza A by PCR: NEGATIVE
Influenza B by PCR: NEGATIVE
SARS Coronavirus 2 by RT PCR: NEGATIVE

## 2021-01-18 LAB — PROTIME-INR
INR: 1.1 (ref 0.8–1.2)
Prothrombin Time: 14.4 seconds (ref 11.4–15.2)

## 2021-01-18 LAB — POC OCCULT BLOOD, ED: Fecal Occult Bld: POSITIVE — AB

## 2021-01-18 LAB — PREPARE RBC (CROSSMATCH)

## 2021-01-18 LAB — CBG MONITORING, ED
Glucose-Capillary: 275 mg/dL — ABNORMAL HIGH (ref 70–99)
Glucose-Capillary: 181 mg/dL — ABNORMAL HIGH (ref 70–99)

## 2021-01-18 LAB — APTT: aPTT: 30 seconds (ref 24–36)

## 2021-01-18 LAB — AMMONIA: Ammonia: 176 umol/L — ABNORMAL HIGH (ref 9–35)

## 2021-01-18 LAB — GLUCOSE, CAPILLARY: Glucose-Capillary: 221 mg/dL — ABNORMAL HIGH (ref 70–99)

## 2021-01-18 MED ORDER — PANTOPRAZOLE SODIUM 40 MG IV SOLR
40.0000 mg | Freq: Two times a day (BID) | INTRAVENOUS | Status: DC
  Administered 2021-01-18 (×2): 40 mg via INTRAVENOUS
  Filled 2021-01-18 (×2): qty 40

## 2021-01-18 MED ORDER — SODIUM CHLORIDE 0.9 % IV BOLUS
1000.0000 mL | Freq: Once | INTRAVENOUS | Status: AC
  Administered 2021-01-18: 1000 mL via INTRAVENOUS

## 2021-01-18 MED ORDER — LEVETIRACETAM IN NACL 500 MG/100ML IV SOLN
500.0000 mg | Freq: Two times a day (BID) | INTRAVENOUS | Status: DC
  Administered 2021-01-18 – 2021-01-19 (×3): 500 mg via INTRAVENOUS
  Filled 2021-01-18 (×4): qty 100

## 2021-01-18 MED ORDER — CHLORHEXIDINE GLUCONATE CLOTH 2 % EX PADS
6.0000 | MEDICATED_PAD | Freq: Every day | CUTANEOUS | Status: DC
  Administered 2021-01-19 – 2021-01-22 (×4): 6 via TOPICAL

## 2021-01-18 MED ORDER — INSULIN ASPART 100 UNIT/ML IJ SOLN
0.0000 [IU] | INTRAMUSCULAR | Status: DC
  Administered 2021-01-18: 2 [IU] via SUBCUTANEOUS
  Administered 2021-01-18: 5 [IU] via SUBCUTANEOUS
  Administered 2021-01-18: 23:00:00 3 [IU] via SUBCUTANEOUS
  Administered 2021-01-19: 2 [IU] via SUBCUTANEOUS
  Administered 2021-01-19: 3 [IU] via SUBCUTANEOUS
  Administered 2021-01-19: 2 [IU] via SUBCUTANEOUS
  Administered 2021-01-20: 3 [IU] via SUBCUTANEOUS
  Administered 2021-01-20: 2 [IU] via SUBCUTANEOUS
  Administered 2021-01-21: 1 [IU] via SUBCUTANEOUS
  Administered 2021-01-21: 2 [IU] via SUBCUTANEOUS

## 2021-01-18 MED ORDER — PANTOPRAZOLE 80MG IVPB - SIMPLE MED
80.0000 mg | Freq: Once | INTRAVENOUS | Status: AC
  Administered 2021-01-18: 80 mg via INTRAVENOUS
  Filled 2021-01-18: qty 80

## 2021-01-18 MED ORDER — SODIUM CHLORIDE 0.9 % IV SOLN: INTRAVENOUS | Status: DC

## 2021-01-18 MED ORDER — GABAPENTIN 100 MG PO CAPS
100.0000 mg | ORAL_CAPSULE | Freq: Two times a day (BID) | ORAL | Status: DC
  Administered 2021-01-18 – 2021-01-22 (×8): 100 mg via ORAL
  Filled 2021-01-18 (×8): qty 1

## 2021-01-18 MED ORDER — PANTOPRAZOLE INFUSION (NEW) - SIMPLE MED
8.0000 mg/h | INTRAVENOUS | Status: DC
  Administered 2021-01-18: 8 mg/h via INTRAVENOUS
  Filled 2021-01-18: qty 80

## 2021-01-18 MED ORDER — MEMANTINE HCL 10 MG PO TABS
10.0000 mg | ORAL_TABLET | Freq: Two times a day (BID) | ORAL | Status: DC
  Administered 2021-01-18 – 2021-01-22 (×8): 10 mg via ORAL
  Filled 2021-01-18 (×9): qty 1

## 2021-01-18 MED ORDER — LACTULOSE 10 GM/15ML PO SOLN
30.0000 g | Freq: Once | ORAL | Status: AC
  Administered 2021-01-18: 30 g via ORAL
  Filled 2021-01-18: qty 60

## 2021-01-18 MED ORDER — SODIUM CHLORIDE 0.9% IV SOLUTION: Freq: Once | INTRAVENOUS | Status: AC

## 2021-01-18 NOTE — TOC Initial Note (Signed)
Transition of Care [facility]) - Initial/Assessment Note    Patient Details  Name: [name] MRN: [id number] Date of Birth: 1940/04/29  Transition of Care [facility]) CM/SW Contact:    Verdell Carmine, RN Phone Number: 01/18/2021, 11:01 AM  Clinical Narrative:                  Patient presented with  GI bleeding. Patient was just discharged from hospital. Chart reviewed. Family had refused to pick up patient. After DC for 2-3 days. APS report done. PT evaluation revealed home with home health, family wanted placement in SNF.  More than one CSW has discussed  steps to long term care with niece and goddaughter. Niece requested TOC call her.  Niece states that "forms were not done right" and would like SNF rehab. Explained to niece that  the patient needs more long term care. This would be performed by family in conjunction with PCP. Reiterated that TOC team does not do LTC placement. Will obtain another PT consult this admission.  CM/CSW will follow for needs. HH is currently with [facility].  Expected Discharge Plan: Long Term Nursing Home Barriers to Discharge: Family Issues   Patient Goals and CMS Choice        Expected Discharge Plan and Services Expected Discharge Plan: Long Term Nursing Home In-house Referral: Clinical Social Work Discharge Planning Services: CM Consult                                          Prior Living Arrangements/Services   Lives with:: Spouse Patient language and need for interpreter reviewed:: Yes        Need for Family Participation in Patient Care: Yes (Comment) Care giver support system in place?: No (comment) (Refused to pick up patient after last discharge)   Criminal Activity/Legal Involvement Pertinent to Current Situation/Hospitalization: No - Comment as needed  Activities of Daily Living      Permission Sought/Granted                  Emotional Assessment       Orientation: : Oriented to Self, Oriented to Place,  Oriented to  Time Alcohol / Substance Use: Not Applicable Psych Involvement: No (comment)  Admission diagnosis:  GI bleed [K92.2] Patient Active Problem List   Diagnosis Date Noted   GI bleed 01/18/2021   AKI (acute kidney injury) ([place])    Dehydration    Acute metabolic encephalopathy 62/69/4854   Altered mental status 12/31/2020   Acute upper GI bleed 12/23/2020   Hemorrhagic shock ([place])    Bullous lesion 10/08/2020   Hypoglycemia 09/29/2020   Impacted cerumen of left ear 07/15/2020   Hearing loss 05/29/2020   Abnormal LFTs 08/09/2019   Hypervolemia due to congestive heart failure ([place]) 08/02/2019   Neuropathy 07/27/2019   Hyperkalemia 10/18/2018   Port-A-Cath in place 05/23/2018   Iron deficiency anemia due to chronic blood loss 04/19/2018   Acute right hip pain 03/10/2018   Hyperpigmented skin lesion 02/07/2018   Leg weakness, bilateral 12/23/2017   Hematemesis 12/04/2017   Pain localized to upper abdomen 10/05/2017   Lower extremity pain, bilateral 07/15/2017   Pulmonary HTN ([place]) 04/01/2017   Heart murmur 04/01/2017   Overgrown toenails 03/18/2017   Osteopenia after menopause 01/13/2017   Epistaxis 07/30/2016   Venous insufficiency of both lower extremities 04/27/2016   Anemia due to chronic blood  loss    Dry skin 02/17/2016   Peripheral vascular disease ([place]) 12/30/2015   Pain in joint of right shoulder 10/22/2015   PAF (paroxysmal atrial fibrillation) ([place]) 72/53/6644   Lichen planus 03/47/4259   Healthcare maintenance 06/20/2015   Seborrheic keratosis 06/20/2015   Memory loss 01/30/2015   Simple febrile convulsions ([place]) 10/21/2014   Hyperammonemia ([place]) 10/03/2014   CKD (chronic kidney disease), stage III ([name]    Seizures ([place]) 09/2014   Acute kidney injury ([place]) 08/27/2014   Chronic diastolic CHF (congestive heart failure) ([place]) 10/03/2013   Acute on chronic heart failure with preserved ejection fraction (HFpEF) ([place]) 10/03/2013   Type 2 diabetes  mellitus without complication ([place]) 56/38/7564   HTN (hypertension), benign 03/02/2012   Fatigue 07/10/2011   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 [address] Phone: [phone] Fax: [facility], [place] [address] Phone: [phone] Fax: [phone]     Social Determinants of Health (SDOH) Interventions    Readmission Risk Interventions No flowsheet data found.

## 2021-01-18 NOTE — Consult Note (Signed)
Referring Provider: Dr. Evette Doffing Primary Care Physician:  Mike Craze, DO Primary Gastroenterologist:  Althia Forts  Reason for Consultation:  GI bleed  HPI: [name] is a 80 y.o. female with multiple medical problems including hereditary hemorrhagic telangiectasias (hx bleeding AVMs) and hospitalization last month for a GI bleed where she had an EGD (12/24/20) that showed a bleeding gastric ulcer (lesser curvature) that was injected with epi and hemoclipped. Patient unable to give any history. Her daughter is at the bedside who gives the entire history stating that she had the acute onset of coffee grounds emesis and black tarry stools this morning. Unknown how much or how many times the vomiting and melena occurred.  Hgb 9.8 (12.1 on 01/17/21 and 12. 4 on 01/12/21).   Past Medical History:  Diagnosis Date   Chronic anemia    Chronic diastolic CHF (congestive heart failure) ([place]) 10/03/2013   Chronic GI bleeding    /notes 11/29/2014   Family history of anesthesia complication    "niece has a hard time coming out" (09/15/2012)   Frequent nosebleeds    chronic   Gastric AV malformation    /notes 11/29/2014   GERD (gastroesophageal reflux disease)    Heart murmur 04/01/2017   Moderate AVSC on echo 09/2016   History of blood transfusion "several"   History of epistaxis    HTN (hypertension), benign 03/02/2012   Hyperlipidemia    Iron deficiency anemia    chronic infusions"   Lichen planus    Both lower extremities   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    Past Surgical History:  Procedure Laterality Date   CATARACT EXTRACTION     "I think it was just one eye"    ESOPHAGOGASTRODUODENOSCOPY  02/26/2011   Procedure: ESOPHAGOGASTRODUODENOSCOPY (EGD);  Surgeon: [name], MD;  Location: [name];  Service: Endoscopy;  Laterality: N/A;   ESOPHAGOGASTRODUODENOSCOPY N/A 11/08/2012   Procedure: ESOPHAGOGASTRODUODENOSCOPY (EGD);  Surgeon: [name], MD;  Location: [name];  Service: Endoscopy;  Laterality: N/A;   ESOPHAGOGASTRODUODENOSCOPY N/A 10/04/2013   Procedure: ESOPHAGOGASTRODUODENOSCOPY (EGD);  Surgeon: Winfield Cunas., MD;  Location: [name];  Service: Endoscopy;  Laterality: N/A;  with APC on stand-by   ESOPHAGOGASTRODUODENOSCOPY N/A 07/06/2014   Procedure: ESOPHAGOGASTRODUODENOSCOPY (EGD);  Surgeon: Clarene Essex, MD;  Location: [name];  Service: Endoscopy;  Laterality: N/A;   ESOPHAGOGASTRODUODENOSCOPY N/A 09/05/2014   Procedure: ESOPHAGOGASTRODUODENOSCOPY (EGD);  Surgeon: Laurence Spates, MD;  Location: [name];  Service: Endoscopy;  Laterality: N/A;  APC on standby to control bleeding   ESOPHAGOGASTRODUODENOSCOPY N/A 11/29/2014   Procedure: ESOPHAGOGASTRODUODENOSCOPY (EGD);  Surgeon: [name], MD;  Location: [facility] ENDOSCOPY;  Service: Endoscopy;  Laterality: N/A;   ESOPHAGOGASTRODUODENOSCOPY N/A 09/28/2015   Procedure: ESOPHAGOGASTRODUODENOSCOPY (EGD);  Surgeon: Clarene Essex, MD;  Location: [facility] ENDOSCOPY;  Service: Endoscopy;  Laterality: N/A;   ESOPHAGOGASTRODUODENOSCOPY (EGD) WITH PROPOFOL N/A 12/04/2017   Procedure: ESOPHAGOGASTRODUODENOSCOPY (EGD) WITH PROPOFOL;  Surgeon: [name], MD;  Location: [facility];  Service: Endoscopy;  Laterality: N/A;   ESOPHAGOGASTRODUODENOSCOPY (EGD) WITH PROPOFOL N/A 12/24/2020   Procedure: ESOPHAGOGASTRODUODENOSCOPY (EGD) WITH PROPOFOL;  Surgeon: Arta Silence, MD;  Location: [facility]  ENDOSCOPY;  Service: Endoscopy;  Laterality: N/A;   ESOPHAGOGASTRODUODENOSCOPY ENDOSCOPY  08/19/2006   with laser treatment   HEMOSTASIS CLIP PLACEMENT  12/24/2020   Procedure: HEMOSTASIS CLIP  PLACEMENT;  Surgeon: Arta Silence, MD;  Location: [facility];  Service: Endoscopy;;   HOT HEMOSTASIS N/A 07/06/2014   Procedure: HOT HEMOSTASIS (ARGON PLASMA COAGULATION/BICAP);  Surgeon: Clarene Essex, MD;  Location: [name];  Service: Endoscopy;  Laterality: N/A;   HOT HEMOSTASIS N/A 09/28/2015   Procedure: HOT HEMOSTASIS (ARGON PLASMA COAGULATION/BICAP);  Surgeon: Clarene Essex, MD;  Location: [facility] ENDOSCOPY;  Service: Endoscopy;  Laterality: N/A;   HOT HEMOSTASIS N/A 12/04/2017   Procedure: HOT HEMOSTASIS (ARGON PLASMA COAGULATION/BICAP);  Surgeon: [name], MD;  Location: [facility];  Service: Endoscopy;  Laterality: N/A;   NASAL HEMORRHAGE CONTROL     "for bleeding"    SAVORY DILATION  02/26/2011   Procedure: SAVORY DILATION;  Surgeon: [name], MD;  Location: [name];  Service: Endoscopy;  Laterality: N/A;  c-arm needed   SCLEROTHERAPY  12/24/2020   Procedure: SCLEROTHERAPY;  Surgeon: Arta Silence, MD;  Location: [facility] ENDOSCOPY;  Service: Endoscopy;;   SUBMUCOSAL INJECTION  12/04/2017   Procedure: SUBMUCOSAL INJECTION;  Surgeon: [name], MD;  Location: [facility];  Service: Endoscopy;;    Prior to Admission medications   Medication Sig Start Date End Date Taking? Authorizing Provider  ACCU-CHEK GUIDE test strip USE AS INSTRUCTED TO CHECK BLOOD SUGAR 3 TIMES DAILY. Patient taking differently: 1 each by Other route in the morning, at noon, and at bedtime. 08/08/20  Yes Elayne Snare, MD  Accu-Chek Softclix Lancets lancets 1 each by Other route 3 (three) times daily. Use as instructed to check blood sugar 3 times per day dx code E11.65 02/01/20  Yes Elayne Snare, MD  amLODipine (NORVASC) 2.5 MG tablet Take 1 tablet (2.5 mg total) by mouth every morning. 01/15/21  Yes Eugenie Filler, MD  diclofenac Sodium (VOLTAREN) 1 % GEL Apply 2 g topically 4 (four) times daily. Patient taking differently: Apply 2 g topically 4 (four) times daily as needed (pain). 12/26/20   Yes Lacinda Axon, MD  Elastic Bandages & Supports ([place]) MISC Wear as much as possible while awake to reduce swelling 04/27/16  Yes Minus Liberty, MD  gabapentin (NEURONTIN) 100 MG capsule TAKE 1 CAPSULE BY MOUTH TWICE DAILY Patient taking differently: Take 100 mg by mouth 2 (two) times daily. 08/16/20  Yes Katsadouros, Vasilios, MD  glucose blood (ACCU-CHEK GUIDE) test strip 1 each by Other route in the morning, at noon, and at bedtime. 07/13/20  Yes [provider]  HUMALOG MIX 75/25 KWIKPEN (75-25) 100 UNIT/ML Kwikpen INJECT 10 UNITS SUBCUTANEOUSLY ONCE DAILY Patient taking differently: Inject 8 Units into the skin daily. 04/17/20  Yes Elayne Snare, MD  insulin lispro (HUMALOG KWIKPEN) 100 UNIT/ML KwikPen 3 units before dinner Patient taking differently: Inject 3 Units into the skin every evening. 10/03/20  Yes Elayne Snare, MD  Insulin Pen Needle (B-D UF III MINI PEN NEEDLES) 31G X 5 MM MISC USE TO INJECT INSULIN THREE TIMES A DAY Patient taking differently: 1 each by Other route See admin instructions. USE TO INJECT INSULIN THREE TIMES A DAY 12/23/20  Yes Elayne Snare, MD  Lancets MISC 1 each by Other route daily. 02/01/20  Yes [provider]  levETIRAcetam (KEPPRA) 500 MG tablet TAKE 1 TABLET BY MOUTH TWICE DAILY Patient taking differently: Take 500 mg by mouth 2 (two) times daily. 06/07/20  Yes [name],  Areeg N, DO  memantine (NAMENDA) 10 MG tablet TAKE 1 TABLET BY MOUTH TWICE DAILY Patient taking differently: Take 10 mg by mouth 2 (two) times daily. 11/11/20  Yes [name], Areeg N, DO  pantoprazole (PROTONIX) 40 MG tablet Take 1 tablet (40 mg total) by mouth 2 (two) times daily. 12/26/20  Yes Lacinda Axon, MD  potassium chloride SA (KLOR-CON M) 20 MEQ tablet Take 20 mEq by mouth 2 (two) times daily.   Yes [provider]  torsemide (DEMADEX) 20 MG tablet Take 20 mg by mouth daily.   Yes [provider]  TRULICITY 1.5  NL/9.7QB SOPN INJECT 1.5MG  SUBCUTANEOUSLY EVERY WEEK ON THURSDAY Patient taking differently: Inject 1.5 mg into the skin every Thursday. 07/29/20  Yes Elayne Snare, MD  Continuous Blood Gluc Sensor (FREESTYLE LIBRE 2 SENSOR) MISC 2 Devices by Does not apply route every 14 (fourteen) days. Patient not taking: Reported on 12/31/2020 04/25/20   Elayne Snare, MD  Misc Natural Products (IMMUNE FORMULA PO) Take 1 tablet by mouth daily. Patient not taking: Reported on 01/18/2021    [provider]  Multiple Vitamins-Minerals (MULTI FOR HER 50+) TABS Take 1 tablet by mouth daily with breakfast. Patient not taking: Reported on 01/18/2021    [provider]  triamcinolone ointment (KENALOG) 0.5 % APPLY 1 APPLICATION TOPICALLY TO RASH TWICE DAILY FOR ITCHING Patient not taking: Reported on 01/18/2021 08/16/20   Riesa Pope, MD    Scheduled Meds:  insulin aspart  0-9 Units Subcutaneous Q4H   pantoprazole (PROTONIX) IV  40 mg Intravenous Q12H   Continuous Infusions:  levETIRAcetam Stopped (01/18/21 1220)   PRN Meds:.  Allergies as of 01/18/2021 - Review Complete 01/18/2021  Allergen Reaction Noted   Aspirin Nausea And Vomiting 05/12/2006    Family History  Problem Relation Age of Onset   Healthy Mother    Stroke Father    Diabetes Niece    Breast cancer Other    Malignant hyperthermia Neg Hx    Seizures Neg Hx     Social History   Socioeconomic History   Marital status: Married    Spouse name: [name]   Number of children: 0   Years of education: 9   Highest education level: Not on file  Occupational History   Occupation: Retired Systems developer: RETIRED  Tobacco Use   Smoking status: Former    Packs/day: 1.00    Years: 20.00    Pack years: 20.00    Types: Cigarettes    Quit date: 02/10/1971    Years since quitting: 49.9   Smokeless tobacco: Never   Tobacco comments:    09/15/2012 "smoked 50-60 yr ago"  Vaping Use   Vaping Use: Never used   Substance and Sexual Activity   Alcohol use: No    Alcohol/week: 0.0 standard drinks   Drug use: No   Sexual activity: Not on file  Other Topics Concern   Not on file  Social History Narrative   Married, lives with husband, [name].  Ambulates without assistance.     Caffeine use: none   Social Determinants of Radio broadcast assistant Strain: Not on file  Food Insecurity: Not on file  Transportation Needs: Not on file  Physical Activity: Not on file  Stress: Not on file  Social Connections: Not on file  Intimate Partner Violence: Not on file    Review of Systems: All negative except as stated above in HPI.  Physical Exam: Vital signs: Vitals:  01/18/21 1000 01/18/21 1300  BP: 108/62 (!) 123/46  Pulse: 68 71  Resp: 20 12  Temp:    SpO2: 95% 97%     General:   Lethargic, elderly, well-nourished, no acute distress, pleasant Head: normocephalic, atraumatic Eyes: anicteric sclera ENT: oropharynx clear Neck: supple, nontender Lungs:  Clear throughout to auscultation.   No wheezes, crackles, or rhonchi. No acute distress. Heart:  Regular rate and rhythm; no murmurs, clicks, rubs,  or gallops. Abdomen: diffuse tenderness with guarding, soft, nondistended, +BS  Rectal:  Deferred Ext: no edema  GI:  Lab Results: Recent Labs    01/17/21 1450 01/18/21 0628  WBC 7.3 12.4*  HGB 12.1 9.8*  HCT 33.9* 27.7*  PLT 142* 147*   BMET Recent Labs    01/17/21 1450 01/18/21 0628  NA 134* 134*  K 4.8 5.1  CL 102 101  CO2 24 20*  GLUCOSE 90 297*  BUN 13 18  CREATININE 1.38* 1.72*  CALCIUM 9.4 8.7*   LFT Recent Labs    01/18/21 0628  PROT 5.1*  ALBUMIN 2.6*  AST 35  ALT 27  ALKPHOS 108  BILITOT 0.8   PT/INR Recent Labs    01/18/21 0628  LABPROT 14.4  INR 1.1     Studies/Results: No results found.  Impression/Plan: 80 yo with history of AVMs and recent bleeding gastric ulcer that was hemoclipped and injected with epinephrine:saline mixture last  month presents with coffee grounds emesis and melena concerning for recurrent bleeding of the gastric ulcer. Continue Protonix 40 mg IV Q 12 hours. EGD tomorrow morning to further evaluate. Clear liquid diet. NPO p MN. Supportive care.    LOS: 0 days   [name]  01/18/2021, 2:24 PM  Questions please call [phone]

## 2021-01-18 NOTE — ED Notes (Signed)
Dinner tray ordered.

## 2021-01-18 NOTE — ED Notes (Signed)
Pt had dark blood BM in bedpan. Cleaned pt and applied a clean brief.

## 2021-01-18 NOTE — ED Provider Notes (Signed)
I see the patient in signout from Dr. Gilford Raid, briefly the patient is a 80 year old female with a chief complaints of GI bleeding has a unfortunate history of recurrent GI bleeds.  Hemoglobin dropped from 12-9.8 since yesterday.  Just in the hospital for encephalopathy.  I discussed the case with Dr. Michail Sermon, [facility] will put on the consult list this morning.  Will discuss with internal medicine for admission.   Deno Etienne, DO 01/18/21 [phone]

## 2021-01-18 NOTE — ED Notes (Signed)
Assisted patient off bedpan x2, clean breif and linen placed after 2 large bloody stool

## 2021-01-18 NOTE — H&P (Signed)
NAME:  [name], MRN:  [id number], DOB:  1941/01/08, LOS: 0 ADMISSION DATE:  01/18/2021, Primary: [name], [name] Delane Ginger, DO  CHIEF COMPLAINT:  hematemesis   Medical Service: Internal Medicine Teaching Service         Attending Physician: Dr. Evette Doffing, Mallie Mussel, *    First Contact: Dr. Vinetta Bergamo Pager: [phone]  Second Contact: Dr. Allyson Sabal Pager: [phone]       After Hours (After 5p/  First Contact Pager: [phone]  weekends / holidays): Second Contact Pager: [phone]    History of present illness   80 year old female with dementia and recent admission for hemorrhagic shock due to gastric ulcer who presented to the emergency department for acute onset hematemesis.  Patient unable to provide history due to cognitive impairment.  HPI primarily obtained from patient's husband and chart review.  Her husband notes that she awoke him around 430 this morning due to vomiting coffee-ground emesis.  He notes that she was in her usual state of health over the past few days and has not been experiencing any abdominal pain or other GI symptoms.  Patient's had multiple admissions over the past month 11/14 - 12/26/2020: Admitted to the ICU with hemorrhagic shock due to esophagitis and gastric ulcer which was subsequently clipped. 11/22 - 01/01/2021: Admitted for encephalopathy and AKI 12/1 - 01/15/2021: Admitted for encephalopathy.  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   Home Medications     Prior to Admission medications   Medication Sig Start Date End Date Taking? Authorizing Provider  ACCU-CHEK GUIDE test strip USE AS INSTRUCTED TO CHECK BLOOD SUGAR 3 TIMES DAILY. 08/08/20   Elayne Snare, MD  Accu-Chek Softclix Lancets lancets 1 each by Other route 3 (three) times daily. Use as instructed to check blood sugar 3 times per day dx code E11.65 02/01/20   Elayne Snare, MD  amLODipine (NORVASC) 2.5 MG tablet Take 1 tablet (2.5 mg total) by mouth every morning. 01/15/21   Eugenie Filler, MD  Continuous Blood Gluc Sensor (FREESTYLE LIBRE 2 SENSOR) MISC 2 Devices by Does not apply route every 14 (fourteen) days. Patient not taking: Reported on 12/31/2020 04/25/20   Elayne Snare, MD  diclofenac Sodium (VOLTAREN) 1 % GEL Apply 2 g topically 4 (four) times daily. Patient taking differently: Apply 2 g topically 4 (four) times daily as needed (pain). 12/26/20   Lacinda Axon, MD  Elastic Bandages & Supports ([place]) MISC Wear as much as possible while awake to reduce swelling 04/27/16   Minus Liberty, MD  gabapentin (NEURONTIN) 100 MG capsule TAKE 1 CAPSULE BY MOUTH TWICE DAILY Patient taking differently: Take 100 mg by mouth 2 (two) times daily. 08/16/20   Katsadouros, Vasilios, MD  glucose blood (ACCU-CHEK GUIDE) test strip USE AS INSTRUCTED TO CHECK BLOOD SUGAR 3 TIMES DAILY. 07/13/20   [provider]  HUMALOG MIX 75/25 KWIKPEN (75-25) 100 UNIT/ML Kwikpen INJECT 10 UNITS SUBCUTANEOUSLY ONCE DAILY Patient taking differently: Inject 8 Units into the  skin daily. 04/17/20   Elayne Snare, MD  insulin lispro (HUMALOG KWIKPEN) 100 UNIT/ML KwikPen 3 units before dinner Patient taking differently: Inject 5 Units into the skin at bedtime. 10/03/20   Elayne Snare, MD  Insulin Pen  Needle (B-D UF III MINI PEN NEEDLES) 31G X 5 MM MISC USE TO INJECT INSULIN THREE TIMES A DAY 12/23/20   Elayne Snare, MD  Lancets MISC 1 each by Other route. 02/01/20   [provider]  levETIRAcetam (KEPPRA) 500 MG tablet TAKE 1 TABLET BY MOUTH TWICE DAILY Patient taking differently: Take 500 mg by mouth 2 (two) times daily. 06/07/20   [name], [name] N, DO  memantine (NAMENDA) 10 MG tablet TAKE 1 TABLET BY MOUTH TWICE DAILY Patient taking differently: Take 10 mg by mouth 2 (two) times daily. 11/11/20   [name], [name] N, DO  Misc Natural Products (IMMUNE FORMULA PO) Take 1 tablet by mouth daily.    [provider]  Multiple Vitamins-Minerals (MULTI FOR HER 50+) TABS Take 1 tablet by mouth daily with breakfast.    [provider]  pantoprazole (PROTONIX) 40 MG tablet Take 1 tablet (40 mg total) by mouth 2 (two) times daily. 12/26/20   Lacinda Axon, MD  potassium chloride SA (KLOR-CON M) 20 MEQ tablet Take 20 mEq by mouth 2 (two) times daily.    [provider]  triamcinolone ointment (KENALOG) 0.5 % APPLY 1 APPLICATION TOPICALLY TO RASH TWICE DAILY FOR ITCHING Patient taking differently: Apply 1 application topically 2 (two) times daily as needed (for itching on legs). 08/16/20   Katsadouros, Vasilios, MD  TRULICITY 1.5 XI/3.3AS SOPN INJECT 1.5MG  SUBCUTANEOUSLY EVERY WEEK ON THURSDAY Patient taking differently: Inject 1.5 mg into the skin every Thursday. 07/29/20   Elayne Snare, MD    Allergies    Allergies as of 01/18/2021 - Review Complete 01/18/2021  Allergen Reaction Noted   Aspirin Nausea And Vomiting 05/12/2006    Social History   reports that she quit smoking about 49 years ago. Her smoking use included cigarettes. She has a 20.00 pack-year smoking history. She has never used smokeless tobacco. She reports that she does not drink alcohol and does not use drugs.   Family History   Her family history includes Breast cancer in an other family member;  Diabetes in her niece; Healthy in her mother; Stroke in her father. There is no history of Malignant hyperthermia or Seizures.   ROS  Unable to be obtained due to dementia.  Objective   Blood pressure (!) 97/58, pulse 69, temperature 98.2 F (36.8 C), temperature source Oral, resp. rate 15, height 5\' 3"  (1.6 m), weight 67.1 kg, SpO2 96 %.    General: Chronically ill appearing female in no distress HEENT: No blood appreciated in or around the mouth, moist mucous membranes, no scleral icterus or conjunctival injection Cardiac: Heart regular rate and rhythm, no lower extremity edema.  Extremities are warm and well-perfused Pulm: Breathing comfortably on room air, lung sounds are clear GI: Abdomen soft, nontender nondistended Neuro: Alert.  Oriented to person and place. Skin: No rash or lesion on limited exam MSK: Normal muscle bulk and tone  Labs    CBC Latest Ref Rng & Units 01/18/2021 01/17/2021 01/12/2021  WBC 4.0 - 10.5 K/uL 12.4(H) 7.3 5.5  Hemoglobin 12.0 - 15.0 g/dL 9.8(L) 12.1 12.4  Hematocrit 36.0 - 46.0 % 27.7(L) 33.9(L) 35.9(L)  Platelets 150 - 400 K/uL 147(L) 142(L) 152   BMP Latest Ref Rng & Units 01/18/2021 01/17/2021  01/12/2021  Glucose 70 - 99 mg/dL 297(H) 90 301(H)  BUN 8 - 23 mg/dL 18 13 16   Creatinine 0.44 - 1.00 mg/dL 1.72(H) 1.38(H) 1.46(H)  BUN/Creat Ratio 12 - 28 - - -  Sodium 135 - 145 mmol/L 134(L) 134(L) 138  Potassium 3.5 - 5.1 mmol/L 5.1 4.8 4.5  Chloride 98 - 111 mmol/L 101 102 109  CO2 22 - 32 mmol/L 20(L) 24 24  Calcium 8.9 - 10.3 mg/dL 8.7(L) 9.4 9.1    Summary  80 year old female with dementia and recent admission one month ago for hemorrhagic shock due to gastric ulcer who was admitted today for hematemesis  Assessment & Plan:  Active Problems:   GI bleed  #  Acute on chronic blood loss anemia secondary to upper GI bleed #  History of hemorrhagic shock from gastric ulcer (November 2022), history of gastric AVMs #  Hereditary hemorrhagic  telangiectasia syndrome.  Follows with Dr. Burr Medico in hematology for bevacizumab and iron transfusions. Last infusions were yesterday, 12/9. -Hemodynamically stable.  2 g drop in hemoglobin in less than 24 hours.  Admit to MedSurg -N.p.o. - Appreciate GI consult - Twice daily CBCs until hemoglobin stabilizes - Transfuse for hemoglobin less than 7  Type 2 diabetes mellitus - Every 4 hours sliding scale  CKD 4.  Chronic and stable.  Monitor intermittently Mild hyponatremia.  Continue to monitor  Hypertension, hyperlipidemia, chronic diastolic heart failure.  Conditions are chronic and stable.  Resume home medications when diet allows  History of seizure disorder - She is on Keppra at home.  Will need to transition to IVF GI plans for another endoscopy, otherwise can resume her home medication  Dementia - Delirium precautions  Goals of care I spent a fair amount of time discussing CODE STATUS with her husband.  He is imminent that he wants everything done for her.  I explained the potential negative implications to reviving her given her multiple comorbidities. He expressed understanding however would like her to remain full code this admission.  Palliative care appears to be following with her outpatient and has a arranged for 12/28. Best practice:  CODE STATUS: Full DVT for prophylaxis: hold for active bleed Social considerations/Family communication: husband updated by phone Dispo: Admit patient to Observation with expected length of stay less than 2 midnights. Will likely be able to discharge home with husband hemoglobin stabilizes   Mitzi Hansen, MD Internal Medicine Resident PGY-3 [name] Pager: [phone] 01/18/2021 9:48 AM

## 2021-01-18 NOTE — ED Provider Notes (Signed)
[name]   CSN: [id number] Arrival date & time: 01/18/21  0557     History Chief Complaint  Patient presents with   Emesis    [name] is a 80 y.o. female.  Pt presents to the ED with vomiting blood.  Pt has a hx of recurrent GI bleeds due to AVMs.  The pt was admitted from 11/14-11/17 for a GI bleed with hgb down to 5.5.  she received 3 units and was d/c with a hgb of 12.8.  pt was readmitted from 11/22-23 for AKI.  She was readmitted from 12/1-7 for AMS and AKI.  Pt said she has been doing well until she woke up in the night vomiting blood.  Pt denies any pain.  She sees [facility].      Past Medical History:  Diagnosis Date   Chronic anemia    Chronic diastolic CHF (congestive heart failure) ([place]) 10/03/2013   Chronic GI bleeding    /notes 11/29/2014   Family history of anesthesia complication    "niece has a hard time coming out" (09/15/2012)   Frequent nosebleeds    chronic   Gastric AV malformation    /notes 11/29/2014   GERD (gastroesophageal reflux disease)    Heart murmur 04/01/2017   Moderate AVSC on echo 09/2016   History of blood transfusion "several"   History of epistaxis    HTN (hypertension), benign 03/02/2012   Hyperlipidemia    Iron deficiency anemia    chronic infusions"   Lichen planus    Both lower extremities   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    Patient Active Problem List   Diagnosis Date Noted   AKI (acute kidney injury) ([place])    Dehydration    Acute metabolic encephalopathy 34/19/3790   Altered mental status 12/31/2020   Acute upper GI bleed 12/23/2020   Hemorrhagic shock ([name]     Bullous lesion 10/08/2020   Hypoglycemia 09/29/2020   Impacted cerumen of left ear 07/15/2020   Hearing loss 05/29/2020   Abnormal LFTs 08/09/2019   Hypervolemia due to congestive heart failure ([place]) 08/02/2019   Neuropathy 07/27/2019   Hyperkalemia 10/18/2018   Port-A-Cath in place 05/23/2018   Iron deficiency anemia due to chronic blood loss 04/19/2018   Acute right hip pain 03/10/2018   Hyperpigmented skin lesion 02/07/2018   Leg weakness, bilateral 12/23/2017   Hematemesis 12/04/2017   Pain localized to upper abdomen 10/05/2017   Lower extremity pain, bilateral 07/15/2017   Pulmonary HTN ([place]) 04/01/2017   Heart murmur 04/01/2017   Overgrown toenails 03/18/2017   Osteopenia after menopause 01/13/2017   Epistaxis 07/30/2016   Venous insufficiency of both lower extremities 04/27/2016   Anemia due to chronic blood loss    Dry skin 02/17/2016   Peripheral vascular disease ([place]) 12/30/2015   Pain in joint of right shoulder 10/22/2015   PAF (paroxysmal atrial fibrillation) ([place]) 24/10/7351   Lichen planus 29/92/4268   Healthcare maintenance 06/20/2015   Seborrheic keratosis 06/20/2015   Memory loss 01/30/2015   Simple febrile convulsions ([place]) 10/21/2014   Hyperammonemia ([place]) 10/03/2014   CKD (  chronic kidney disease), stage III ([place])    Seizures ([place]) 09/2014   Acute kidney injury ([place]) 08/27/2014   Chronic diastolic CHF (congestive heart failure) ([place]) 10/03/2013   Acute on chronic heart failure with preserved ejection fraction (HFpEF) ([place]) 10/03/2013   Type 2 diabetes mellitus without complication ([place]) 61/95/0932   HTN (hypertension), benign 03/02/2012   Fatigue 07/10/2011   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   HHT (hereditary hemorrhagic telangiectasia) ([place]) 02/01/2011    Past Surgical History:  Procedure Laterality Date   CATARACT EXTRACTION     "I think it was just one eye"   ESOPHAGOGASTRODUODENOSCOPY  02/26/2011   Procedure: ESOPHAGOGASTRODUODENOSCOPY (EGD);   Surgeon: [name], MD;  Location: [name];  Service: Endoscopy;  Laterality: N/A;   ESOPHAGOGASTRODUODENOSCOPY N/A 11/08/2012   Procedure: ESOPHAGOGASTRODUODENOSCOPY (EGD);  Surgeon: [name], MD;  Location: [name];  Service: Endoscopy;  Laterality: N/A;   ESOPHAGOGASTRODUODENOSCOPY N/A 10/04/2013   Procedure: ESOPHAGOGASTRODUODENOSCOPY (EGD);  Surgeon: Winfield Cunas., MD;  Location: [name];  Service: Endoscopy;  Laterality: N/A;  with APC on stand-by   ESOPHAGOGASTRODUODENOSCOPY N/A 07/06/2014   Procedure: ESOPHAGOGASTRODUODENOSCOPY (EGD);  Surgeon: Clarene Essex, MD;  Location: [name];  Service: Endoscopy;  Laterality: N/A;   ESOPHAGOGASTRODUODENOSCOPY N/A 09/05/2014   Procedure: ESOPHAGOGASTRODUODENOSCOPY (EGD);  Surgeon: Laurence Spates, MD;  Location: [name];  Service: Endoscopy;  Laterality: N/A;  APC on standby to control bleeding   ESOPHAGOGASTRODUODENOSCOPY N/A 11/29/2014   Procedure: ESOPHAGOGASTRODUODENOSCOPY (EGD);  Surgeon: [name], MD;  Location: [facility] ENDOSCOPY;  Service: Endoscopy;  Laterality: N/A;   ESOPHAGOGASTRODUODENOSCOPY N/A 09/28/2015   Procedure: ESOPHAGOGASTRODUODENOSCOPY (EGD);  Surgeon: Clarene Essex, MD;  Location: [facility] ENDOSCOPY;  Service: Endoscopy;  Laterality: N/A;   ESOPHAGOGASTRODUODENOSCOPY (EGD) WITH PROPOFOL N/A 12/04/2017   Procedure: ESOPHAGOGASTRODUODENOSCOPY (EGD) WITH PROPOFOL;  Surgeon: [name], MD;  Location: [facility];  Service: Endoscopy;  Laterality: N/A;   ESOPHAGOGASTRODUODENOSCOPY (EGD) WITH PROPOFOL N/A 12/24/2020   Procedure: ESOPHAGOGASTRODUODENOSCOPY (EGD) WITH PROPOFOL;  Surgeon: Arta Silence, MD;  Location: [employer];  Service: Endoscopy;  Laterality: N/A;   ESOPHAGOGASTRODUODENOSCOPY ENDOSCOPY  08/19/2006   with laser treatment   HEMOSTASIS CLIP PLACEMENT  12/24/2020   Procedure: HEMOSTASIS CLIP PLACEMENT;  Surgeon: Arta Silence, MD;  Location: [facility];  Service: Endoscopy;;    HOT HEMOSTASIS N/A 07/06/2014   Procedure: HOT HEMOSTASIS (ARGON PLASMA COAGULATION/BICAP);  Surgeon: Clarene Essex, MD;  Location: [name];  Service: Endoscopy;  Laterality: N/A;   HOT HEMOSTASIS N/A 09/28/2015   Procedure: HOT HEMOSTASIS (ARGON PLASMA COAGULATION/BICAP);  Surgeon: Clarene Essex, MD;  Location: [facility] ENDOSCOPY;  Service: Endoscopy;  Laterality: N/A;   HOT HEMOSTASIS N/A 12/04/2017   Procedure: HOT HEMOSTASIS (ARGON PLASMA COAGULATION/BICAP);  Surgeon: [name], MD;  Location: [facility];  Service: Endoscopy;  Laterality: N/A;   NASAL HEMORRHAGE CONTROL     "for bleeding"    SAVORY DILATION  02/26/2011   Procedure: SAVORY DILATION;  Surgeon: [name], MD;  Location: [name];  Service: Endoscopy;  Laterality: N/A;  c-arm needed   SCLEROTHERAPY  12/24/2020   Procedure: SCLEROTHERAPY;  Surgeon: Arta Silence, MD;  Location[facility] ENDOSCOPY;  Service: Endoscopy;;   SUBMUCOSAL INJECTION  12/04/2017   Procedure: SUBMUCOSAL INJECTION;  Surgeon: [name], MD;  Location[facility] ENDOSCOPY;  Service: Endoscopy;;     OB History   No obstetric history on file.     Family History  Problem Relation Age of Onset   Healthy Mother    Stroke Father  Diabetes Niece    Breast cancer Other    Malignant hyperthermia Neg Hx    Seizures Neg Hx     Social History   Tobacco Use   Smoking status: Former    Packs/day: 1.00    Years: 20.00    Pack years: 20.00    Types: Cigarettes    Quit date: 02/10/1971    Years since quitting: 49.9   Smokeless tobacco: Never   Tobacco comments:    09/15/2012 "smoked 50-60 yr ago"  Vaping Use   Vaping Use: Never used  Substance Use Topics   Alcohol use: No    Alcohol/week: 0.0 standard drinks   Drug use: No    Home Medications Prior to Admission medications   Medication Sig Start Date End Date Taking? Authorizing Provider  ACCU-CHEK GUIDE test strip USE AS INSTRUCTED TO CHECK BLOOD SUGAR 3 TIMES DAILY. 08/08/20   Elayne Snare,  MD  Accu-Chek Softclix Lancets lancets 1 each by Other route 3 (three) times daily. Use as instructed to check blood sugar 3 times per day dx code E11.65 02/01/20   Elayne Snare, MD  amLODipine (NORVASC) 2.5 MG tablet Take 1 tablet (2.5 mg total) by mouth every morning. 01/15/21   Eugenie Filler, MD  Continuous Blood Gluc Sensor (FREESTYLE LIBRE 2 SENSOR) MISC 2 Devices by Does not apply route every 14 (fourteen) days. Patient not taking: Reported on 12/31/2020 04/25/20   Elayne Snare, MD  diclofenac Sodium (VOLTAREN) 1 % GEL Apply 2 g topically 4 (four) times daily. Patient taking differently: Apply 2 g topically 4 (four) times daily as needed (pain). 12/26/20   Lacinda Axon, MD  Elastic Bandages & Supports ([place]) MISC Wear as much as possible while awake to reduce swelling 04/27/16   Minus Liberty, MD  gabapentin (NEURONTIN) 100 MG capsule TAKE 1 CAPSULE BY MOUTH TWICE DAILY Patient taking differently: Take 100 mg by mouth 2 (two) times daily. 08/16/20   Katsadouros, Vasilios, MD  glucose blood (ACCU-CHEK GUIDE) test strip USE AS INSTRUCTED TO CHECK BLOOD SUGAR 3 TIMES DAILY. 07/13/20   [provider]  HUMALOG MIX 75/25 KWIKPEN (75-25) 100 UNIT/ML Kwikpen INJECT 10 UNITS SUBCUTANEOUSLY ONCE DAILY Patient taking differently: Inject 8 Units into the skin daily. 04/17/20   Elayne Snare, MD  insulin lispro (HUMALOG KWIKPEN) 100 UNIT/ML KwikPen 3 units before dinner Patient taking differently: Inject 5 Units into the skin at bedtime. 10/03/20   Elayne Snare, MD  Insulin Pen Needle (B-D UF III MINI PEN NEEDLES) 31G X 5 MM MISC USE TO INJECT INSULIN THREE TIMES A DAY 12/23/20   Elayne Snare, MD  Lancets MISC 1 each by Other route. 02/01/20   [provider]  levETIRAcetam (KEPPRA) 500 MG tablet TAKE 1 TABLET BY MOUTH TWICE DAILY Patient taking differently: Take 500 mg by mouth 2 (two) times daily. 06/07/20   [name], Areeg N, DO  memantine (NAMENDA) 10 MG  tablet TAKE 1 TABLET BY MOUTH TWICE DAILY Patient taking differently: Take 10 mg by mouth 2 (two) times daily. 11/11/20   [name], Areeg N, DO  Misc Natural Products (IMMUNE FORMULA PO) Take 1 tablet by mouth daily.    [provider]  Multiple Vitamins-Minerals (MULTI FOR HER 50+) TABS Take 1 tablet by mouth daily with breakfast.    [provider]  pantoprazole (PROTONIX) 40 MG tablet Take 1 tablet (40 mg total) by mouth 2 (two) times daily. 12/26/20   Lacinda Axon, MD  potassium  chloride SA (KLOR-CON M) 20 MEQ tablet Take 20 mEq by mouth 2 (two) times daily.    [provider]  triamcinolone ointment (KENALOG) 0.5 % APPLY 1 APPLICATION TOPICALLY TO RASH TWICE DAILY FOR ITCHING Patient taking differently: Apply 1 application topically 2 (two) times daily as needed (for itching on legs). 08/16/20   Katsadouros, Vasilios, MD  TRULICITY 1.5 KD/3.2IZ SOPN INJECT 1.5MG  SUBCUTANEOUSLY EVERY WEEK ON THURSDAY Patient taking differently: Inject 1.5 mg into the skin every Thursday. 07/29/20   Elayne Snare, MD    Allergies    Aspirin  Review of Systems   Review of Systems  Gastrointestinal:        Vomiting blood  All other systems reviewed and are negative.  Physical Exam Updated Vital Signs BP (!) 132/50 (BP Location: Right Arm)   Pulse 65   Temp 97.7 F (36.5 C) (Oral)   Resp 18   Ht 5\' 3"  (1.6 m)   Wt 67.1 kg   LMP  (LMP Unknown)   SpO2 99%   BMI 26.22 kg/m   Physical Exam Vitals and nursing [name] reviewed.  Constitutional:      Appearance: Normal appearance.  HENT:     Head: Normocephalic and atraumatic.     Right Ear: External ear normal.     Left Ear: External ear normal.     Nose: Nose normal.     Mouth/Throat:     Mouth: Mucous membranes are dry.  Eyes:     Extraocular Movements: Extraocular movements intact.     Conjunctiva/sclera: Conjunctivae normal.     Pupils: Pupils are equal, round, and reactive to light.  Cardiovascular:     Rate  and Rhythm: Normal rate and regular rhythm.     Pulses: Normal pulses.     Heart sounds: Normal heart sounds.  Pulmonary:     Effort: Pulmonary effort is normal.     Breath sounds: Normal breath sounds.  Abdominal:     General: Abdomen is flat. Bowel sounds are normal.     Palpations: Abdomen is soft.  Genitourinary:    Rectum: Guaiac result positive.  Musculoskeletal:        General: Normal range of motion.     Cervical back: Normal range of motion and neck supple.  Skin:    General: Skin is warm.     Capillary Refill: Capillary refill takes less than 2 seconds.  Neurological:     General: No focal deficit present.     Mental Status: She is alert and oriented to person, place, and time.  Psychiatric:        Mood and Affect: Mood normal.        Behavior: Behavior normal.    ED Results / Procedures / Treatments   Labs (all labs ordered are listed, but only abnormal results are displayed) Labs Reviewed  CBC WITH DIFFERENTIAL/PLATELET - Abnormal; Notable for the following components:      Result Value   WBC 12.4 (*)    RBC 3.12 (*)    Hemoglobin 9.8 (*)    HCT 27.7 (*)    Platelets 147 (*)    Neutro Abs 10.4 (*)    Abs Immature Granulocytes 0.10 (*)    All other components within normal limits  POC OCCULT BLOOD, ED - Abnormal; Notable for the following components:   Fecal Occult Bld POSITIVE (*)    All other components within normal limits  RESP PANEL BY RT-PCR (FLU A&B, COVID) [id number]  COMPREHENSIVE METABOLIC PANEL  PROTIME-INR  AMMONIA  TYPE AND SCREEN    EKG None  Radiology No results found.  Procedures Procedures   Medications Ordered in ED Medications  sodium chloride 0.9 % bolus 1,000 mL (1,000 mLs Intravenous New Bag/Given 01/18/21 2094)    And  0.9 %  sodium chloride infusion (has no administration in time range)  pantoprozole (PROTONIX) 80 mg /NS 100 mL infusion (has no administration in time range)  pantoprazole (PROTONIX) 80 mg /NS 100 mL IVPB  (80 mg Intravenous New Bag/Given 01/18/21 7096)    ED Course  I have reviewed the triage vital signs and the nursing notes.  Pertinent labs & imaging results that were available during my care of the patient were reviewed by me and considered in my medical decision making (see chart for details).    MDM Rules/Calculators/A&P                           Pt started on Protonix.  Labs are pending at shift change.  Pt signed out to Dr. Tyrone Nine.   Final Clinical Impression(s) / ED Diagnoses Final diagnoses:  Gastrointestinal hemorrhage, unspecified gastrointestinal hemorrhage type    Rx / DC Orders ED Discharge Orders     None        Isla Pence, MD 01/18/21 [phone]

## 2021-01-18 NOTE — H&P (View-Only) (Signed)
Referring Provider: Dr. Evette Doffing Primary Care Physician:  Mike Craze, DO Primary Gastroenterologist:  Althia Forts  Reason for Consultation:  GI bleed  HPI: [name] is a 80 y.o. female with multiple medical problems including hereditary hemorrhagic telangiectasias (hx bleeding AVMs) and hospitalization last month for a GI bleed where she had an EGD (12/24/20) that showed a bleeding gastric ulcer (lesser curvature) that was injected with epi and hemoclipped. Patient unable to give any history. Her daughter is at the bedside who gives the entire history stating that she had the acute onset of coffee grounds emesis and black tarry stools this morning. Unknown how much or how many times the vomiting and melena occurred.  Hgb 9.8 (12.1 on 01/17/21 and 12. 4 on 01/12/21).   Past Medical History:  Diagnosis Date   Chronic anemia    Chronic diastolic CHF (congestive heart failure) ([place]) 10/03/2013   Chronic GI bleeding    /notes 11/29/2014   Family history of anesthesia complication    "niece has a hard time coming out" (09/15/2012)   Frequent nosebleeds    chronic   Gastric AV malformation    /notes 11/29/2014   GERD (gastroesophageal reflux disease)    Heart murmur 04/01/2017   Moderate AVSC on echo 09/2016   History of blood transfusion "several"   History of epistaxis    HTN (hypertension), benign 03/02/2012   Hyperlipidemia    Iron deficiency anemia    chronic infusions"   Lichen planus    Both lower extremities   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    Past Surgical History:  Procedure Laterality Date   CATARACT EXTRACTION     "I think it was just one eye"    ESOPHAGOGASTRODUODENOSCOPY  02/26/2011   Procedure: ESOPHAGOGASTRODUODENOSCOPY (EGD);  Surgeon: [name], MD;  Location: [name];  Service: Endoscopy;  Laterality: N/A;   ESOPHAGOGASTRODUODENOSCOPY N/A 11/08/2012   Procedure: ESOPHAGOGASTRODUODENOSCOPY (EGD);  Surgeon: [name], MD;  Location: [name];  Service: Endoscopy;  Laterality: N/A;   ESOPHAGOGASTRODUODENOSCOPY N/A 10/04/2013   Procedure: ESOPHAGOGASTRODUODENOSCOPY (EGD);  Surgeon: Winfield Cunas., MD;  Location: [name];  Service: Endoscopy;  Laterality: N/A;  with APC on stand-by   ESOPHAGOGASTRODUODENOSCOPY N/A 07/06/2014   Procedure: ESOPHAGOGASTRODUODENOSCOPY (EGD);  Surgeon: Clarene Essex, MD;  Location: [name];  Service: Endoscopy;  Laterality: N/A;   ESOPHAGOGASTRODUODENOSCOPY N/A 09/05/2014   Procedure: ESOPHAGOGASTRODUODENOSCOPY (EGD);  Surgeon: Laurence Spates, MD;  Location: [name];  Service: Endoscopy;  Laterality: N/A;  APC on standby to control bleeding   ESOPHAGOGASTRODUODENOSCOPY N/A 11/29/2014   Procedure: ESOPHAGOGASTRODUODENOSCOPY (EGD);  Surgeon: [name], MD;  Location: [facility] ENDOSCOPY;  Service: Endoscopy;  Laterality: N/A;   ESOPHAGOGASTRODUODENOSCOPY N/A 09/28/2015   Procedure: ESOPHAGOGASTRODUODENOSCOPY (EGD);  Surgeon: Clarene Essex, MD;  Location: [facility] ENDOSCOPY;  Service: Endoscopy;  Laterality: N/A;   ESOPHAGOGASTRODUODENOSCOPY (EGD) WITH PROPOFOL N/A 12/04/2017   Procedure: ESOPHAGOGASTRODUODENOSCOPY (EGD) WITH PROPOFOL;  Surgeon: [name], MD;  Location: [place];  Service: Endoscopy;  Laterality: N/A;   ESOPHAGOGASTRODUODENOSCOPY (EGD) WITH PROPOFOL N/A 12/24/2020   Procedure: ESOPHAGOGASTRODUODENOSCOPY (EGD) WITH PROPOFOL;  Surgeon: Arta Silence, MD;  Location: [facility]  ENDOSCOPY;  Service: Endoscopy;  Laterality: N/A;   ESOPHAGOGASTRODUODENOSCOPY ENDOSCOPY  08/19/2006   with laser treatment   HEMOSTASIS CLIP PLACEMENT  12/24/2020   Procedure: HEMOSTASIS CLIP  PLACEMENT;  Surgeon: Arta Silence, MD;  Location: [place];  Service: Endoscopy;;   HOT HEMOSTASIS N/A 07/06/2014   Procedure: HOT HEMOSTASIS (ARGON PLASMA COAGULATION/BICAP);  Surgeon: Clarene Essex, MD;  Location: [name];  Service: Endoscopy;  Laterality: N/A;   HOT HEMOSTASIS N/A 09/28/2015   Procedure: HOT HEMOSTASIS (ARGON PLASMA COAGULATION/BICAP);  Surgeon: Clarene Essex, MD;  Location: [facility] ENDOSCOPY;  Service: Endoscopy;  Laterality: N/A;   HOT HEMOSTASIS N/A 12/04/2017   Procedure: HOT HEMOSTASIS (ARGON PLASMA COAGULATION/BICAP);  Surgeon: [name], MD;  Location: [place];  Service: Endoscopy;  Laterality: N/A;   NASAL HEMORRHAGE CONTROL     "for bleeding"    SAVORY DILATION  02/26/2011   Procedure: SAVORY DILATION;  Surgeon: [name], MD;  Location: [name];  Service: Endoscopy;  Laterality: N/A;  c-arm needed   SCLEROTHERAPY  12/24/2020   Procedure: SCLEROTHERAPY;  Surgeon: Arta Silence, MD;  Location: [facility] ENDOSCOPY;  Service: Endoscopy;;   SUBMUCOSAL INJECTION  12/04/2017   Procedure: SUBMUCOSAL INJECTION;  Surgeon: [name], MD;  Location: [place];  Service: Endoscopy;;    Prior to Admission medications   Medication Sig Start Date End Date Taking? Authorizing Provider  ACCU-CHEK GUIDE test strip USE AS INSTRUCTED TO CHECK BLOOD SUGAR 3 TIMES DAILY. Patient taking differently: 1 each by Other route in the morning, at noon, and at bedtime. 08/08/20  Yes Elayne Snare, MD  Accu-Chek Softclix Lancets lancets 1 each by Other route 3 (three) times daily. Use as instructed to check blood sugar 3 times per day dx code E11.65 02/01/20  Yes Elayne Snare, MD  amLODipine (NORVASC) 2.5 MG tablet Take 1 tablet (2.5 mg total) by mouth every morning. 01/15/21  Yes Eugenie Filler, MD  diclofenac Sodium (VOLTAREN) 1 % GEL Apply 2 g topically 4 (four) times daily. Patient taking differently: Apply 2 g topically 4 (four) times daily as needed (pain). 12/26/20   Yes Lacinda Axon, MD  Elastic Bandages & Supports ([place]) MISC Wear as much as possible while awake to reduce swelling 04/27/16  Yes Minus Liberty, MD  gabapentin (NEURONTIN) 100 MG capsule TAKE 1 CAPSULE BY MOUTH TWICE DAILY Patient taking differently: Take 100 mg by mouth 2 (two) times daily. 08/16/20  Yes Katsadouros, Vasilios, MD  glucose blood (ACCU-CHEK GUIDE) test strip 1 each by Other route in the morning, at noon, and at bedtime. 07/13/20  Yes [provider]  HUMALOG MIX 75/25 KWIKPEN (75-25) 100 UNIT/ML Kwikpen INJECT 10 UNITS SUBCUTANEOUSLY ONCE DAILY Patient taking differently: Inject 8 Units into the skin daily. 04/17/20  Yes Elayne Snare, MD  insulin lispro (HUMALOG KWIKPEN) 100 UNIT/ML KwikPen 3 units before dinner Patient taking differently: Inject 3 Units into the skin every evening. 10/03/20  Yes Elayne Snare, MD  Insulin Pen Needle (B-D UF III MINI PEN NEEDLES) 31G X 5 MM MISC USE TO INJECT INSULIN THREE TIMES A DAY Patient taking differently: 1 each by Other route See admin instructions. USE TO INJECT INSULIN THREE TIMES A DAY 12/23/20  Yes Elayne Snare, MD  Lancets MISC 1 each by Other route daily. 02/01/20  Yes [provider]  levETIRAcetam (KEPPRA) 500 MG tablet TAKE 1 TABLET BY MOUTH TWICE DAILY Patient taking differently: Take 500 mg by mouth 2 (two) times daily. 06/07/20  Yes [name],  Areeg N, DO  memantine (NAMENDA) 10 MG tablet TAKE 1 TABLET BY MOUTH TWICE DAILY Patient taking differently: Take 10 mg by mouth 2 (two) times daily. 11/11/20  Yes [name], Areeg N, DO  pantoprazole (PROTONIX) 40 MG tablet Take 1 tablet (40 mg total) by mouth 2 (two) times daily. 12/26/20  Yes Lacinda Axon, MD  potassium chloride SA (KLOR-CON M) 20 MEQ tablet Take 20 mEq by mouth 2 (two) times daily.   Yes [provider]  torsemide (DEMADEX) 20 MG tablet Take 20 mg by mouth daily.   Yes [provider]  TRULICITY 1.5  RS/8.5IO SOPN INJECT 1.5MG  SUBCUTANEOUSLY EVERY WEEK ON THURSDAY Patient taking differently: Inject 1.5 mg into the skin every Thursday. 07/29/20  Yes Elayne Snare, MD  Continuous Blood Gluc Sensor (FREESTYLE LIBRE 2 SENSOR) MISC 2 Devices by Does not apply route every 14 (fourteen) days. Patient not taking: Reported on 12/31/2020 04/25/20   Elayne Snare, MD  Misc Natural Products (IMMUNE FORMULA PO) Take 1 tablet by mouth daily. Patient not taking: Reported on 01/18/2021    [provider]  Multiple Vitamins-Minerals (MULTI FOR HER 50+) TABS Take 1 tablet by mouth daily with breakfast. Patient not taking: Reported on 01/18/2021    [provider]  triamcinolone ointment (KENALOG) 0.5 % APPLY 1 APPLICATION TOPICALLY TO RASH TWICE DAILY FOR ITCHING Patient not taking: Reported on 01/18/2021 08/16/20   Riesa Pope, MD    Scheduled Meds:  insulin aspart  0-9 Units Subcutaneous Q4H   pantoprazole (PROTONIX) IV  40 mg Intravenous Q12H   Continuous Infusions:  levETIRAcetam Stopped (01/18/21 1220)   PRN Meds:.  Allergies as of 01/18/2021 - Review Complete 01/18/2021  Allergen Reaction Noted   Aspirin Nausea And Vomiting 05/12/2006    Family History  Problem Relation Age of Onset   Healthy Mother    Stroke Father    Diabetes Niece    Breast cancer Other    Malignant hyperthermia Neg Hx    Seizures Neg Hx     Social History   Socioeconomic History   Marital status: Married    Spouse name: [name]   Number of children: 0   Years of education: 9   Highest education level: Not on file  Occupational History   Occupation: Retired Systems developer: RETIRED  Tobacco Use   Smoking status: Former    Packs/day: 1.00    Years: 20.00    Pack years: 20.00    Types: Cigarettes    Quit date: 02/10/1971    Years since quitting: 49.9   Smokeless tobacco: Never   Tobacco comments:    09/15/2012 "smoked 50-60 yr ago"  Vaping Use   Vaping Use: Never used   Substance and Sexual Activity   Alcohol use: No    Alcohol/week: 0.0 standard drinks   Drug use: No   Sexual activity: Not on file  Other Topics Concern   Not on file  Social History Narrative   Married, lives with husband, [name].  Ambulates without assistance.     Caffeine use: none   Social Determinants of Radio broadcast assistant Strain: Not on file  Food Insecurity: Not on file  Transportation Needs: Not on file  Physical Activity: Not on file  Stress: Not on file  Social Connections: Not on file  Intimate Partner Violence: Not on file    Review of Systems: All negative except as stated above in HPI.  Physical Exam: Vital signs: Vitals:  01/18/21 1000 01/18/21 1300  BP: 108/62 (!) 123/46  Pulse: 68 71  Resp: 20 12  Temp:    SpO2: 95% 97%     General:   Lethargic, elderly, well-nourished, no acute distress, pleasant Head: normocephalic, atraumatic Eyes: anicteric sclera ENT: oropharynx clear Neck: supple, nontender Lungs:  Clear throughout to auscultation.   No wheezes, crackles, or rhonchi. No acute distress. Heart:  Regular rate and rhythm; no murmurs, clicks, rubs,  or gallops. Abdomen: diffuse tenderness with guarding, soft, nondistended, +BS  Rectal:  Deferred Ext: no edema  GI:  Lab Results: Recent Labs    01/17/21 1450 01/18/21 0628  WBC 7.3 12.4*  HGB 12.1 9.8*  HCT 33.9* 27.7*  PLT 142* 147*   BMET Recent Labs    01/17/21 1450 01/18/21 0628  NA 134* 134*  K 4.8 5.1  CL 102 101  CO2 24 20*  GLUCOSE 90 297*  BUN 13 18  CREATININE 1.38* 1.72*  CALCIUM 9.4 8.7*   LFT Recent Labs    01/18/21 0628  PROT 5.1*  ALBUMIN 2.6*  AST 35  ALT 27  ALKPHOS 108  BILITOT 0.8   PT/INR Recent Labs    01/18/21 0628  LABPROT 14.4  INR 1.1     Studies/Results: No results found.  Impression/Plan: 80 yo with history of AVMs and recent bleeding gastric ulcer that was hemoclipped and injected with epinephrine:saline mixture last  month presents with coffee grounds emesis and melena concerning for recurrent bleeding of the gastric ulcer. Continue Protonix 40 mg IV Q 12 hours. EGD tomorrow morning to further evaluate. Clear liquid diet. NPO p MN. Supportive care.    LOS: 0 days   [name]  01/18/2021, 2:24 PM  Questions please call [phone]

## 2021-01-19 ENCOUNTER — Observation Stay (HOSPITAL_COMMUNITY): Payer: Medicare Other | Admitting: Anesthesiology

## 2021-01-19 ENCOUNTER — Encounter (HOSPITAL_COMMUNITY)
Admission: EM | Disposition: A | Discharge status: Skilled Nursing Facility | Payer: Self-pay | Source: Home / Self Care | Attending: Internal Medicine

## 2021-01-19 DIAGNOSIS — K2970 Gastritis, unspecified, without bleeding: Secondary | ICD-10-CM | POA: Diagnosis present

## 2021-01-19 DIAGNOSIS — I480 Paroxysmal atrial fibrillation: Secondary | ICD-10-CM | POA: Diagnosis present

## 2021-01-19 DIAGNOSIS — K219 Gastro-esophageal reflux disease without esophagitis: Secondary | ICD-10-CM | POA: Diagnosis present

## 2021-01-19 DIAGNOSIS — H9190 Unspecified hearing loss, unspecified ear: Secondary | ICD-10-CM | POA: Diagnosis present

## 2021-01-19 DIAGNOSIS — Z7985 Long-term (current) use of injectable non-insulin antidiabetic drugs: Secondary | ICD-10-CM | POA: Diagnosis not present

## 2021-01-19 DIAGNOSIS — I5032 Chronic diastolic (congestive) heart failure: Secondary | ICD-10-CM | POA: Diagnosis present

## 2021-01-19 DIAGNOSIS — E1165 Type 2 diabetes mellitus with hyperglycemia: Secondary | ICD-10-CM | POA: Diagnosis present

## 2021-01-19 DIAGNOSIS — K254 Chronic or unspecified gastric ulcer with hemorrhage: Secondary | ICD-10-CM | POA: Diagnosis present

## 2021-01-19 DIAGNOSIS — E785 Hyperlipidemia, unspecified: Secondary | ICD-10-CM | POA: Diagnosis present

## 2021-01-19 DIAGNOSIS — N179 Acute kidney failure, unspecified: Secondary | ICD-10-CM | POA: Diagnosis present

## 2021-01-19 DIAGNOSIS — F0390 Unspecified dementia without behavioral disturbance: Secondary | ICD-10-CM | POA: Diagnosis present

## 2021-01-19 DIAGNOSIS — K08409 Partial loss of teeth, unspecified cause, unspecified class: Secondary | ICD-10-CM | POA: Diagnosis present

## 2021-01-19 DIAGNOSIS — G40909 Epilepsy, unspecified, not intractable, without status epilepticus: Secondary | ICD-10-CM | POA: Diagnosis present

## 2021-01-19 DIAGNOSIS — M8580 Other specified disorders of bone density and structure, unspecified site: Secondary | ICD-10-CM | POA: Diagnosis present

## 2021-01-19 DIAGNOSIS — Z20822 Contact with and (suspected) exposure to covid-19: Secondary | ICD-10-CM | POA: Diagnosis present

## 2021-01-19 DIAGNOSIS — I130 Hypertensive heart and chronic kidney disease with heart failure and stage 1 through stage 4 chronic kidney disease, or unspecified chronic kidney disease: Secondary | ICD-10-CM | POA: Diagnosis present

## 2021-01-19 DIAGNOSIS — E871 Hypo-osmolality and hyponatremia: Secondary | ICD-10-CM | POA: Diagnosis present

## 2021-01-19 DIAGNOSIS — K922 Gastrointestinal hemorrhage, unspecified: Secondary | ICD-10-CM | POA: Diagnosis present

## 2021-01-19 DIAGNOSIS — E1140 Type 2 diabetes mellitus with diabetic neuropathy, unspecified: Secondary | ICD-10-CM | POA: Diagnosis present

## 2021-01-19 DIAGNOSIS — E1122 Type 2 diabetes mellitus with diabetic chronic kidney disease: Secondary | ICD-10-CM | POA: Diagnosis present

## 2021-01-19 DIAGNOSIS — N1831 Chronic kidney disease, stage 3a: Secondary | ICD-10-CM | POA: Diagnosis present

## 2021-01-19 DIAGNOSIS — D62 Acute posthemorrhagic anemia: Secondary | ICD-10-CM | POA: Diagnosis present

## 2021-01-19 DIAGNOSIS — L439 Lichen planus, unspecified: Secondary | ICD-10-CM | POA: Diagnosis present

## 2021-01-19 DIAGNOSIS — I272 Pulmonary hypertension, unspecified: Secondary | ICD-10-CM | POA: Diagnosis present

## 2021-01-19 DIAGNOSIS — I780 Hereditary hemorrhagic telangiectasia: Secondary | ICD-10-CM | POA: Diagnosis present

## 2021-01-19 DIAGNOSIS — Z794 Long term (current) use of insulin: Secondary | ICD-10-CM | POA: Diagnosis not present

## 2021-01-19 HISTORY — PX: SCLEROTHERAPY: SHX6841

## 2021-01-19 HISTORY — PX: HEMOSTASIS CLIP PLACEMENT: SHX6857

## 2021-01-19 HISTORY — PX: ESOPHAGOGASTRODUODENOSCOPY (EGD) WITH PROPOFOL: SHX5813

## 2021-01-19 LAB — CBC
HCT: 26.3 % — ABNORMAL LOW (ref 36.0–46.0)
HCT: 29.8 % — ABNORMAL LOW (ref 36.0–46.0)
HCT: 28.7 % — ABNORMAL LOW (ref 36.0–46.0)
Hemoglobin: 9.2 g/dL — ABNORMAL LOW (ref 12.0–15.0)
Hemoglobin: 10.4 g/dL — ABNORMAL LOW (ref 12.0–15.0)
Hemoglobin: 9.9 g/dL — ABNORMAL LOW (ref 12.0–15.0)
MCH: 30.9 pg (ref 26.0–34.0)
MCH: 30.8 pg (ref 26.0–34.0)
MCH: 30.7 pg (ref 26.0–34.0)
MCHC: 35 g/dL (ref 30.0–36.0)
MCHC: 34.9 g/dL (ref 30.0–36.0)
MCHC: 34.5 g/dL (ref 30.0–36.0)
MCV: 88.3 fL (ref 80.0–100.0)
MCV: 88.2 fL (ref 80.0–100.0)
MCV: 89.1 fL (ref 80.0–100.0)
Platelets: 118 10*3/uL — ABNORMAL LOW (ref 150–400)
Platelets: 96 10*3/uL — ABNORMAL LOW (ref 150–400)
Platelets: 102 10*3/uL — ABNORMAL LOW (ref 150–400)
RBC: 2.98 MIL/uL — ABNORMAL LOW (ref 3.87–5.11)
RBC: 3.38 MIL/uL — ABNORMAL LOW (ref 3.87–5.11)
RBC: 3.22 MIL/uL — ABNORMAL LOW (ref 3.87–5.11)
RDW: 13.9 % (ref 11.5–15.5)
RDW: 14.3 % (ref 11.5–15.5)
RDW: 14.4 % (ref 11.5–15.5)
WBC: 6.7 10*3/uL (ref 4.0–10.5)
WBC: 10.8 10*3/uL — ABNORMAL HIGH (ref 4.0–10.5)
WBC: 8.6 10*3/uL (ref 4.0–10.5)
nRBC: 0 % (ref 0.0–0.2)
nRBC: 0 % (ref 0.0–0.2)
nRBC: 0 % (ref 0.0–0.2)

## 2021-01-19 LAB — BPAM RBC
Blood Product Expiration Date: 202212172359
ISSUE DATE / TIME: 202212101936
Unit Type and Rh: 6200

## 2021-01-19 LAB — BASIC METABOLIC PANEL
Anion gap: 6 (ref 5–15)
BUN: 31 mg/dL — ABNORMAL HIGH (ref 8–23)
CO2: 23 mmol/L (ref 22–32)
Calcium: 8.6 mg/dL — ABNORMAL LOW (ref 8.9–10.3)
Chloride: 111 mmol/L (ref 98–111)
Creatinine, Ser: 1.35 mg/dL — ABNORMAL HIGH (ref 0.44–1.00)
GFR, Estimated: 40 mL/min — ABNORMAL LOW (ref >60)
Glucose, Bld: 164 mg/dL — ABNORMAL HIGH (ref 70–99)
Potassium: 4.1 mmol/L (ref 3.5–5.1)
Sodium: 140 mmol/L (ref 135–145)

## 2021-01-19 LAB — TYPE AND SCREEN
ABO/RH(D): A POS
Antibody Screen: NEGATIVE
Unit division: 0

## 2021-01-19 LAB — GLUCOSE, CAPILLARY
Glucose-Capillary: 117 mg/dL — ABNORMAL HIGH (ref 70–99)
Glucose-Capillary: 108 mg/dL — ABNORMAL HIGH (ref 70–99)
Glucose-Capillary: 156 mg/dL — ABNORMAL HIGH (ref 70–99)
Glucose-Capillary: 200 mg/dL — ABNORMAL HIGH (ref 70–99)
Glucose-Capillary: 231 mg/dL — ABNORMAL HIGH (ref 70–99)
Glucose-Capillary: 168 mg/dL — ABNORMAL HIGH (ref 70–99)
Glucose-Capillary: 68 mg/dL — ABNORMAL LOW (ref 70–99)
Glucose-Capillary: 124 mg/dL — ABNORMAL HIGH (ref 70–99)

## 2021-01-19 SURGERY — ESOPHAGOGASTRODUODENOSCOPY (EGD) WITH PROPOFOL
Anesthesia: Monitor Anesthesia Care

## 2021-01-19 MED ORDER — PANTOPRAZOLE INFUSION (NEW) - SIMPLE MED
8.0000 mg/h | INTRAVENOUS | Status: DC
  Filled 2021-01-19: qty 100

## 2021-01-19 MED ORDER — DEXTROSE 50 % IV SOLN
INTRAVENOUS | Status: AC
  Administered 2021-01-19: 12.5 g via INTRAVENOUS
  Filled 2021-01-19: qty 50

## 2021-01-19 MED ORDER — LEVETIRACETAM 500 MG PO TABS
500.0000 mg | ORAL_TABLET | Freq: Two times a day (BID) | ORAL | Status: DC
  Administered 2021-01-20 – 2021-01-22 (×5): 500 mg via ORAL
  Filled 2021-01-19 (×5): qty 1

## 2021-01-19 MED ORDER — ONDANSETRON HCL 4 MG/2ML IJ SOLN
INTRAMUSCULAR | Status: DC | PRN
  Administered 2021-01-19: 4 mg via INTRAVENOUS

## 2021-01-19 MED ORDER — PROPOFOL 500 MG/50ML IV EMUL
INTRAVENOUS | Status: DC | PRN
Start: 2021-01-19 — End: 2021-01-19
  Administered 2021-01-19: 100 ug/kg/min via INTRAVENOUS

## 2021-01-19 MED ORDER — EPINEPHRINE 1 MG/10ML IJ SOSY
PREFILLED_SYRINGE | INTRAMUSCULAR | Status: AC
  Filled 2021-01-19: qty 10

## 2021-01-19 MED ORDER — PANTOPRAZOLE SODIUM 40 MG IV SOLR
40.0000 mg | Freq: Once | INTRAVENOUS | Status: AC
  Administered 2021-01-19: 40 mg via INTRAVENOUS
  Filled 2021-01-19: qty 40

## 2021-01-19 MED ORDER — SODIUM CHLORIDE 0.9% FLUSH: 10.0000 mL | INTRAVENOUS | Status: DC | PRN

## 2021-01-19 MED ORDER — DEXTROSE 50 % IV SOLN: 12.5000 g | INTRAVENOUS | Status: AC

## 2021-01-19 MED ORDER — SODIUM CHLORIDE 0.9 % IV SOLN: INTRAVENOUS | Status: DC

## 2021-01-19 MED ORDER — DEXTROSE-NACL 5-0.45 % IV SOLN: INTRAVENOUS | Status: AC

## 2021-01-19 MED ORDER — PANTOPRAZOLE SODIUM 40 MG IV SOLR: 40.0000 mg | Freq: Two times a day (BID) | INTRAVENOUS | Status: DC

## 2021-01-19 MED ORDER — LACTATED RINGERS IV SOLN
INTRAVENOUS | Status: AC | PRN
  Administered 2021-01-19: 20 mL/h via INTRAVENOUS

## 2021-01-19 MED ORDER — SODIUM CHLORIDE (PF) 0.9 % IJ SOLN
PREFILLED_SYRINGE | INTRAMUSCULAR | Status: DC | PRN
  Administered 2021-01-19: 5 mL
  Administered 2021-01-19: 8 mL

## 2021-01-19 MED ORDER — LEVETIRACETAM IN NACL 500 MG/100ML IV SOLN
500.0000 mg | Freq: Two times a day (BID) | INTRAVENOUS | Status: AC
  Administered 2021-01-19: 500 mg via INTRAVENOUS
  Filled 2021-01-19: qty 100

## 2021-01-19 MED ORDER — FENTANYL CITRATE (PF) 100 MCG/2ML IJ SOLN
INTRAMUSCULAR | Status: AC
  Filled 2021-01-19: qty 2

## 2021-01-19 MED ORDER — LIDOCAINE 2% (20 MG/ML) 5 ML SYRINGE
INTRAMUSCULAR | Status: DC | PRN
  Administered 2021-01-19: 30 mg via INTRAVENOUS

## 2021-01-19 MED ORDER — SODIUM CHLORIDE 0.9% FLUSH
10.0000 mL | Freq: Two times a day (BID) | INTRAVENOUS | Status: DC
  Administered 2021-01-19 – 2021-01-22 (×2): 10 mL

## 2021-01-19 MED ORDER — FENTANYL CITRATE (PF) 100 MCG/2ML IJ SOLN
25.0000 ug | INTRAMUSCULAR | Status: DC | PRN
  Administered 2021-01-19: 25 ug via INTRAVENOUS

## 2021-01-19 MED ORDER — PANTOPRAZOLE INFUSION (NEW) - SIMPLE MED
8.0000 mg/h | INTRAVENOUS | Status: DC
  Administered 2021-01-19 – 2021-01-21 (×3): 8 mg/h via INTRAVENOUS
  Filled 2021-01-19 (×2): qty 80
  Filled 2021-01-19: qty 100
  Filled 2021-01-19: qty 80

## 2021-01-19 SURGICAL SUPPLY — 15 items
BLOCK BITE 60FR ADLT L/F BLUE (MISCELLANEOUS) ×5 IMPLANT
ELECT REM PT RETURN 9FT ADLT (ELECTROSURGICAL)
ELECTRODE REM PT RTRN 9FT ADLT (ELECTROSURGICAL) IMPLANT
FORCEP RJ3 GP 1.8X160 W-NEEDLE (CUTTING FORCEPS) IMPLANT
FORCEPS BIOP RAD 4 LRG CAP 4 (CUTTING FORCEPS) IMPLANT
NDL SCLEROTHERAPY 25GX240 (NEEDLE) IMPLANT
NEEDLE SCLEROTHERAPY 25GX240 (NEEDLE) IMPLANT
PROBE APC STR FIRE (PROBE) IMPLANT
PROBE INJECTION GOLD (MISCELLANEOUS)
PROBE INJECTION GOLD 7FR (MISCELLANEOUS) IMPLANT
SNARE SHORT THROW 13M SML OVAL (MISCELLANEOUS) IMPLANT
SYR 50ML LL SCALE MARK (SYRINGE) IMPLANT
TUBING ENDO SMARTCAP PENTAX (MISCELLANEOUS) ×10 IMPLANT
TUBING IRRIGATION ENDOGATOR (MISCELLANEOUS) ×5 IMPLANT
WATER STERILE IRR 1000ML POUR (IV SOLUTION) IMPLANT

## 2021-01-19 NOTE — Interval H&P Note (Signed)
History and Physical Interval Note:  01/19/2021 7:39 AM  [name]  has presented today for surgery, with the diagnosis of gi bleed.  The various methods of treatment have been discussed with the patient and family. After consideration of risks, benefits and other options for treatment, the patient has consented to  Procedure(s): ESOPHAGOGASTRODUODENOSCOPY (EGD) WITH PROPOFOL (N/A) as a surgical intervention.  The patient's history has been reviewed, patient examined, no change in status, stable for surgery.  I have reviewed the patient's chart and labs.  Questions were answered to the patient's satisfaction.     [name]

## 2021-01-19 NOTE — Anesthesia Preprocedure Evaluation (Addendum)
Anesthesia Evaluation  Patient identified by MRN, date of birth, ID band Patient awake    Reviewed: Allergy & Precautions, H&P , NPO status , Patient's Chart, lab work & pertinent test results  Airway Mallampati: II  TM Distance: >3 FB Neck ROM: Full    Dental no notable dental hx. (+) Edentulous Upper, Partial Lower, Dental Advisory Given   Pulmonary neg pulmonary ROS, former smoker,    Pulmonary exam normal breath sounds clear to auscultation       Cardiovascular hypertension, Pt. on medications +CHF  + Valvular Problems/Murmurs AS  Rhythm:Regular Rate:Normal     Neuro/Psych Seizures -, Well Controlled,  negative psych ROS   GI/Hepatic Neg liver ROS, GERD  Medicated,  Endo/Other  diabetes, Insulin Dependent  Renal/GU negative Renal ROS  negative genitourinary   Musculoskeletal   Abdominal   Peds  Hematology  (+) Blood dyscrasia, anemia ,   Anesthesia Other Findings   Reproductive/Obstetrics negative OB ROS                            Anesthesia Physical Anesthesia Plan  ASA: 3  Anesthesia Plan: MAC   Post-op Pain Management: Minimal or no pain anticipated   Induction: Intravenous  PONV Risk Score and Plan: 2 and Propofol infusion and Treatment may vary due to age or medical condition  Airway Management Planned: Nasal Cannula and Natural Airway  Additional Equipment:   Intra-op Plan:   Post-operative Plan:   Informed Consent: I have reviewed the patients History and Physical, chart, labs and discussed the procedure including the risks, benefits and alternatives for the proposed anesthesia with the patient or authorized representative who has indicated his/her understanding and acceptance.     Dental advisory given  Plan Discussed with: CRNA  Anesthesia Plan Comments:         Anesthesia Quick Evaluation

## 2021-01-19 NOTE — Progress Notes (Signed)
Pt admitted to 5N20, oriented to room and call light system. Instructed to call for assist as needed for fall prevention. Denies pain. Blood transfusion completed, no adverse reactions noted.

## 2021-01-19 NOTE — Evaluation (Signed)
Physical Therapy Evaluation Patient Details Name: [name] MRN: [id number] DOB: 1940-06-01 Today's Date: 01/19/2021  History of Present Illness  This episode of care, pt is admitted for GI bleed, s/p endoscopy on 12/11 with successful hemostasis; PMH significant for PMHx of hereditary hemorrhagic telangectasia syndrome, history of gastric AVMs, hemorrhagic shock from gastric ulcer in November of 2022, as well as CKD and seizures , GI bleeds, anemia, HTN, DM2, dementia, and recent hospitalizations over the past month  Clinical Impression   Pt admitted with above diagnosis. Admitted immediately after most recent admission; Prior to that, pt was living at home with spouse and family assist; Presents to PT with generalized weakness and decr activity tolerance; Pt has good physical rehab potential, and I anticipate steady progress as she improves medically; family reports concerns that they will not be able to meet pt's care needs; At this point, recommend a stay at SNF for short-term rehab; I also recognize that family will still need to be working on more sustainable longer-term living situation, if they are unable to meet her needs; please see other TOC notes as well; Pt currently with functional limitations due to the deficits listed below (see PT Problem List). Pt will benefit from skilled PT to increase their independence and safety with mobility to allow discharge to the venue listed below.          Recommendations for follow up therapy are one component of a multi-disciplinary discharge planning process, led by the attending physician.  Recommendations may be updated based on patient status, additional functional criteria and insurance authorization.  Follow Up Recommendations Skilled nursing-short term rehab (<3 hours/day)    Assistance Recommended at Discharge Frequent or constant Supervision/Assistance  Functional Status Assessment Patient has had a recent decline in their functional  status and demonstrates the ability to make significant improvements in function in a reasonable and predictable amount of time.  Equipment Recommendations  None recommended by PT    Recommendations for Other Services       Precautions / Restrictions Precautions Precautions: Fall      Mobility  Bed Mobility Overal bed mobility: Needs Assistance Bed Mobility: Supine to Sit     Supine to sit: Min assist     General bed mobility comments: increased time to transfer to side of bed; phsycial assist to help scoot to eOB and square off hips    Transfers Overall transfer level: Needs assistance Equipment used: Rolling walker (2 wheels) Transfers: Sit to/from Stand Sit to Stand: Min guard           General transfer comment: Min guard for overall ambulation in room and toilet transfer with RW. No overt loss of balance.    Ambulation/Gait Ambulation/Gait assistance: Min guard Gait Distance (Feet): 12 Feet Assistive device: Rolling walker (2 wheels) Gait Pattern/deviations: Step-through pattern;Decreased stride length;Shuffle;Trunk flexed Gait velocity: Decreased     General Gait Details: Cues to self-monitor for activity tolerance; noting tremulousness in knees  Stairs            Wheelchair Mobility    Modified Rankin (Stroke Patients Only)       Balance Overall balance assessment: Mild deficits observed, not formally tested   Sitting balance-Leahy Scale: Fair       Standing balance-Leahy Scale: Poor                               Pertinent Vitals/Pain Pain Assessment: No/denies pain Pain  Intervention(s): Monitored during session    Home Living Family/patient expects to be discharged to:: Private residence Living Arrangements: Spouse/significant other Available Help at Discharge: Family Type of Home: House Home Access: [place]: One [place]: Conservation officer, nature (2 wheels);Shower seat;Rollator (4  wheels);BSC/3in1;Wheelchair - manual Additional Comments: Per pt, she lives with spouse who can assist her "a Sipos".  Niece is also supportive but can only provide intermittent assist; It seems that family has been having difficulty meeting her needs over the past few hospital admissions, and they are requesting SNF    Prior Function Prior Level of Function : Needs assist       Physical Assist : Mobility (physical);ADLs (physical) Mobility (physical): Gait   Mobility Comments: Pt reports using a RW at home and all the time in the community but also reports WC is available ADLs Comments: Pt completes all ADL's with no difficulties and Niece assists with Cooking and cleaning.     Hand Dominance   Dominant Hand: Right    Extremity/Trunk Assessment   Upper Extremity Assessment Upper Extremity Assessment: Defer to OT evaluation    Lower Extremity Assessment Lower Extremity Assessment: Generalized weakness    Cervical / Trunk Assessment Cervical / Trunk Assessment: Kyphotic  Communication   Communication: No difficulties  Cognition Arousal/Alertness: Awake/alert Behavior During Therapy: WFL for tasks assessed/performed Overall Cognitive Status: Impaired/Different from baseline Area of Impairment: Memory;Safety/judgement;Problem solving                     Memory: Decreased short-term memory   Safety/Judgement: Decreased awareness of safety   Problem Solving: Slow processing;Requires verbal cues General Comments: Patient alert to self. and following all instructions.        General Comments General comments (skin integrity, edema, etc.): Pt's daughter, Lawerance Bach, was present and helpful during session; Voiced concern that pt's husband cannot meet pt's care needs at this time    Exercises     Assessment/Plan    PT Assessment Patient needs continued PT services  PT Problem List Decreased strength;Decreased activity tolerance;Decreased balance;Decreased  mobility;Decreased cognition;Decreased knowledge of use of DME;Decreased safety awareness;Decreased knowledge of precautions       PT Treatment Interventions DME instruction;Gait training;Stair training;Functional mobility training;Therapeutic activities;Therapeutic exercise;Balance training;Patient/family education    PT Goals (Current goals can be found in the Care Plan section)  Acute Rehab PT Goals Patient Stated Goal: Did not state, but agreeable to getting up and OOB PT Goal Formulation: Patient unable to participate in goal setting Time For Goal Achievement: 02/02/21 Potential to Achieve Goals: Good    Frequency Min 3X/week   Barriers to discharge Decreased caregiver support Spouse can only offer limited assist    Co-evaluation               AM-PAC PT "6 Clicks" Mobility  Outcome Measure Help needed turning from your back to your side while in a flat bed without using bedrails?: None Help needed moving from lying on your back to sitting on the side of a flat bed without using bedrails?: A [place] Help needed moving to and from a bed to a chair (including a wheelchair)?: A [place] Help needed standing up from a chair using your arms (e.g., wheelchair or bedside chair)?: A [place] Help needed to walk in hospital room?: A [place] Help needed climbing 3-5 steps with a railing? : A Lot 6 Click Score: 18    End of Session Equipment Utilized  During Treatment: Gait belt Activity Tolerance: Patient tolerated treatment well Patient left: Other (comment) (Makingher way from bathroom to recliner with Nurse TEch)   PT Visit Diagnosis: Muscle weakness (generalized) (M62.81);Difficulty in walking, not elsewhere classified (R26.2);Unsteadiness on feet (R26.81)        01/19/21 1639  PT Time Calculation  PT Start Time (ACUTE ONLY) 1521  PT Stop Time (ACUTE ONLY) 1543  PT Time Calculation (min) (ACUTE ONLY) 22 min  PT General Charges  $$ ACUTE PT VISIT 1 Visit  PT Evaluation   $PT Eval Moderate Complexity 1 Mod      Roney [place], [place]  Acute Rehabilitation Services Pager [phone] Office [phone]   Colletta [place] 01/19/2021, 9:41 PM

## 2021-01-19 NOTE — Transfer of Care (Signed)
Immediate Anesthesia Transfer of Care Note  Patient: [name]  Procedure(s) Performed: ESOPHAGOGASTRODUODENOSCOPY (EGD) WITH PROPOFOL SCLEROTHERAPY HEMOSTASIS CLIP PLACEMENT  Patient Location: PACU  Anesthesia Type:MAC  Level of Consciousness: awake and alert   Airway & Oxygen Therapy: Patient Spontanous Breathing and Patient connected to nasal cannula oxygen  Post-op Assessment: Report given to RN and Post -op Vital signs reviewed and stable  Post vital signs: Reviewed and stable  Last Vitals:  Vitals Value Taken Time  BP 137/51 01/19/21 0828  Temp    Pulse 89 01/19/21 0829  Resp 19 01/19/21 0829  SpO2 96 % 01/19/21 0829  Vitals shown include unvalidated device data.  Last Pain:  Vitals:   01/19/21 0719  TempSrc: Temporal  PainSc: 0-No pain         Complications: No notable events documented.

## 2021-01-19 NOTE — Anesthesia Postprocedure Evaluation (Signed)
Anesthesia Post Note  Patient: [name]  Procedure(s) Performed: ESOPHAGOGASTRODUODENOSCOPY (EGD) WITH PROPOFOL SCLEROTHERAPY HEMOSTASIS CLIP PLACEMENT     Patient location during evaluation: PACU Anesthesia Type: MAC Level of consciousness: awake and alert Pain management: pain level controlled Vital Signs Assessment: post-procedure vital signs reviewed and stable Respiratory status: spontaneous breathing, nonlabored ventilation and respiratory function stable Cardiovascular status: stable and blood pressure returned to baseline Postop Assessment: no apparent nausea or vomiting Anesthetic complications: no   No notable events documented.  Last Vitals:  Vitals:   01/19/21 0858 01/19/21 0921  BP: (!) 124/40 (!) 123/45  Pulse: 81 80  Resp: 16 16  Temp: 36.7 C 36.8 C  SpO2: 93% 99%    Last Pain:  Vitals:   01/19/21 0921  TempSrc: Oral  PainSc:                  [name],W. EDMOND

## 2021-01-19 NOTE — Op Note (Addendum)
[facility] Patient Name: [name] Procedure Date : 01/19/2021 MRN: [id number] Attending MD: [name] , MD Date of Birth: [birth year] CSN: [id number] Age: 80 Admit Type: Inpatient Procedure:                Upper GI endoscopy Indications:              Suspected upper gastrointestinal bleeding,                            Coffee-ground emesis, Melena Providers:                [name], MD, Grace Isaac, RN, Luan Moore, Technician, Eligha Bridegroom CRNA, CRNA Referring MD:             hospital team Medicines:                Propofol per Anesthesia, Monitored Anesthesia Care Complications:            No immediate complications. Estimated Blood Loss:     Estimated blood loss was minimal. Procedure:                Pre-Anesthesia Assessment:                           - Prior to the procedure, a History and Physical                            was performed, and patient medications and                            allergies were reviewed. The patient's tolerance of                            previous anesthesia was also reviewed. The risks                            and benefits of the procedure and the sedation                            options and risks were discussed with the patient.                            All questions were answered, and informed consent                            was obtained. Prior Anticoagulants: The patient has                            taken no previous anticoagulant or antiplatelet                            agents. ASA Grade Assessment: III - A patient with  severe systemic disease. After reviewing the risks                            and benefits, the patient was deemed in                            satisfactory condition to undergo the procedure.                           After obtaining informed consent, the endoscope was                            passed under direct  vision. Throughout the                            procedure, the patient's blood pressure, pulse, and                            oxygen saturations were monitored continuously. The                            GIF-[id number] ([id number] Olympus endoscope was introduced                            through the mouth, and advanced to the second part                            of duodenum. The upper GI endoscopy was performed                            with difficulty due to excessive bleeding.                            Successful completion of the procedure was aided by                            controlling the bleeding. The patient tolerated the                            procedure well. Scope In: Scope Out: Findings:      The examined esophagus was normal.      The Z-line was regular and was found 35 cm from the incisors.      One oozing cratered gastric ulcer with pigmented material was found on       the lesser curvature of the stomach. The lesion was 7 mm in largest       dimension. Area was successfully injected with 13 mL of a 1:10,000       solution of epinephrine for hemostasis. To stop active bleeding, five       hemostatic clips were successfully placed. There was no bleeding at the       end of the procedure.      Localized mild inflammation characterized by congestion (edema) was       found in the gastric antrum.      The  cardia and gastric fundus were normal on retroflexion.      The examined duodenum was normal. Impression:               - Normal esophagus.                           - Z-line regular, 35 cm from the incisors.                           - Oozing gastric ulcer with pigmented material.                            Injected. Clips were placed.                           - Gastritis.                           - Normal examined duodenum.                           - No specimens collected. Recommendation:           - Clear liquid diet.                           - Observe  patient's clinical course.                           - Give Protonix (pantoprazole): 8 mg/hr IV by                            continuous infusion. Procedure Code(s):        --- Professional ---                           [phone], Esophagogastroduodenoscopy, flexible,                            transoral; with control of bleeding, any method Diagnosis Code(s):        --- Professional ---                           K92.0, Hematemesis                           K25.4, Chronic or unspecified gastric ulcer with                            hemorrhage                           K92.1, Melena (includes Hematochezia)                           K29.70, Gastritis, unspecified, without bleeding CPT copyright 2019 American Medical Association. All rights reserved. The codes documented in this report are preliminary and upon coder review may  be revised to meet current compliance requirements. Evette Doffing  Aloha Gell, MD 01/19/2021 8:30:21 AM This report has been signed electronically. Number of Addenda: 0

## 2021-01-19 NOTE — Progress Notes (Addendum)
Physical Therapy Note  PT eval complete with full note to follow;  Recommend post-acute rehab to maximize independence and safety with mobility and ADLs for transition out of hospital;  I recognize the situation is complicated, and the family has to make some longer term decisions after her rehab stay at [facility];  Will follow acutely,    Roney Marion, [place] Pager [phone] Office [phone]

## 2021-01-19 NOTE — Progress Notes (Signed)
HD#0 SUBJECTIVE:  Patient Summary: [name] is a 80 y.o. female with a PMHx of hereditary hemorrhagic telangectasia syndrome, history of gastric AVMs, hemorrhagic shock from gastric ulcer in November of 2022, as well as CKD and seizures presenting with an episode of hematemesis, found to have a bleeding gastric ulcer.    Overnight Events: documented melanic bowel movements. Hgb to 7.4, for which she wqas given 1U pRBCs.   Subjective: patient seen and assessed after EGD. Patient is fully alert and oriented. She is communicative. She denies pain and asks questions about the procedure, the findings, and how much longer she must stay in the hospital.   OBJECTIVE:  Vital Signs: Vitals:   01/19/21 0830 01/19/21 0845 01/19/21 0858 01/19/21 0921  BP: (!) 137/51 (!) 135/42 (!) 124/40 (!) 123/45  Pulse: 87 87 81 80  Resp: (!) 27 (!) $Remo'23 16 16  'TliJh$ Temp: 98 F (36.7 C)  98 F (36.7 C) 98.2 F (36.8 C)  TempSrc:    Oral  SpO2: 97% 93% 93% 99%  Weight:      Height:          Intake/Output Summary (Last 24 hours) at 01/19/2021 1117 Last data filed at 01/18/2021 2300 Gross per 24 hour  Intake 464.17 ml  Output 1 ml  Net 463.17 ml   Net IO Since Admission: 1,741.43 mL [01/19/21 1117]  Physical Exam: Physical Exam Vitals reviewed.  Cardiovascular:     Rate and Rhythm: Normal rate and regular rhythm.     Heart sounds: No murmur heard. Pulmonary:     Effort: Pulmonary effort is normal.     Breath sounds: Normal breath sounds.  Abdominal:     General: Bowel sounds are normal.     Palpations: Abdomen is soft.     Tenderness: There is no abdominal tenderness.  Neurological:     Mental Status: She is alert and oriented to person, place, and time.    Patient Lines/Drains/Airways Status     Active Line/Drains/Airways     Name Placement date Placement time Site Days   Implanted Port 04/13/18 Right Chest 04/13/18  0855  Chest  1012   Implanted Port Right Chest --  --  Chest  --    Peripheral IV 01/18/21 20 G Anterior;Distal;Left Forearm 01/18/21  0619  Forearm  1   External Urinary Catheter 01/18/21  3500  --  1             ASSESSMENT/PLAN:  Assessment: [name] is a 80 y.o. female with a PMHx of hereditary hemorrhagic telangectasia syndrome, history of gastric AVMs, hemorrhagic shock from gastric ulcer in November of 2022, as well as CKD and seizures presenting with an episode of hematemesis, found to have a oozing gastric ulcer.     Plan: #hematemesis #oozing gastric ulcer #Hx hereditary hemorrhagic telangectasia syndrome Patient presented with an episode of hematemesis and developed anemia to 7.4 yesterday evening. She was given 1U pRBCs and got an EGD this morning. Study remarkable for oozing gastric ulcer which was injected with epi/saline and clipped. GI has recommended IR intervention if bleeding continues. Hgb of 10.4 this AM. -GI consult, appreciate recs -NPO today, CLD tomorrow -Protonix infusion at $RemoveBef'8mg'udxBdvPUhl$ /hr -D5-1/2NS 100 mL/hr x10 hrs -CBC BID  #T2DM Patient taking small amounts of insulin in addition to Trulicity at home. Last A1C 7.8 on 11/2020.  -Continue sensitive SSI  #CKD 4? 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 Cr currently 1.35, with eGFR of 40.   #History of seizures -Continue home Keppra, switch to PO starting tomorrow  Other conditions chronic and stable: HFpEF, HTN, HLD  FEN/GI: NPO for today, CLD tomorrow, IVF as above VTE: SCDs Dispo: likely home Code Status: FULL  Signature: Corky Sox, MD PGY-1 Pager: [phone]  Please contact the on call pager after 5 pm and on weekends at [phone].

## 2021-01-19 NOTE — Progress Notes (Signed)
PT Cancellation Note  Patient Details Name: [name] MRN: [id number] DOB: 05/31/1940   Cancelled Treatment:    Reason Eval/Treat Not Completed: Patient at procedure or test/unavailable  Will follow up later today as time allows;  Otherwise, will follow up for PT tomorrow;   Thank you,  Roney Marion, PT  Acute Rehabilitation Services Pager [phone] Office [phone]    [place] 01/19/2021, 8:34 AM

## 2021-01-19 NOTE — Anesthesia Procedure Notes (Signed)
Procedure Name: MAC Date/Time: 01/19/2021 7:38 AM Performed by: Eligha Bridegroom, CRNA Pre-anesthesia Checklist: Patient identified, Emergency Drugs available, Suction available, Patient being monitored and Timeout performed Patient Re-evaluated:Patient Re-evaluated prior to induction Oxygen Delivery Method: Nasal cannula Preoxygenation: Pre-oxygenation with 100% oxygen Induction Type: IV induction

## 2021-01-19 NOTE — Brief Op Note (Signed)
01/18/2021 - 01/19/2021  9:10 AM  PATIENT:  [name]  80 y.o. female  PRE-OPERATIVE DIAGNOSIS:  gi bleed  POST-OPERATIVE DIAGNOSIS:  gastric ulcer bleeding lessser curvature stomach. 1 old clip in place, added 5 clips. 13 cc epi  PROCEDURE:  Procedure(s): ESOPHAGOGASTRODUODENOSCOPY (EGD) WITH PROPOFOL (N/A) SCLEROTHERAPY HEMOSTASIS CLIP PLACEMENT  SURGEON:  Surgeon(s) and Role:    * [name], MD - Primary   See endopro note for details. Changed to Protonix drip. If signs of ongoing bleeding, then would recommend IR consult for embolization over repeat endoscopy due to multiple hemoclips and 13 cc PIR:JJOACZ injection. Keep NPO today but if stable can start clear liquid diet tomorrow. Dr. Watt Climes to f/u tomorrow.

## 2021-01-20 DIAGNOSIS — E1122 Type 2 diabetes mellitus with diabetic chronic kidney disease: Secondary | ICD-10-CM

## 2021-01-20 DIAGNOSIS — N1831 Chronic kidney disease, stage 3a: Secondary | ICD-10-CM

## 2021-01-20 DIAGNOSIS — Z794 Long term (current) use of insulin: Secondary | ICD-10-CM

## 2021-01-20 DIAGNOSIS — I480 Paroxysmal atrial fibrillation: Secondary | ICD-10-CM

## 2021-01-20 LAB — BASIC METABOLIC PANEL
Anion gap: 7 (ref 5–15)
BUN: 20 mg/dL (ref 8–23)
CO2: 25 mmol/L (ref 22–32)
Calcium: 8.4 mg/dL — ABNORMAL LOW (ref 8.9–10.3)
Chloride: 112 mmol/L — ABNORMAL HIGH (ref 98–111)
Creatinine, Ser: 1.41 mg/dL — ABNORMAL HIGH (ref 0.44–1.00)
GFR, Estimated: 38 mL/min — ABNORMAL LOW (ref >60)
Glucose, Bld: 90 mg/dL (ref 70–99)
Potassium: 4 mmol/L (ref 3.5–5.1)
Sodium: 144 mmol/L (ref 135–145)

## 2021-01-20 LAB — CBC
HCT: 26 % — ABNORMAL LOW (ref 36.0–46.0)
HCT: 29.8 % — ABNORMAL LOW (ref 36.0–46.0)
Hemoglobin: 9.1 g/dL — ABNORMAL LOW (ref 12.0–15.0)
Hemoglobin: 9.9 g/dL — ABNORMAL LOW (ref 12.0–15.0)
MCH: 31.6 pg (ref 26.0–34.0)
MCH: 31 pg (ref 26.0–34.0)
MCHC: 35 g/dL (ref 30.0–36.0)
MCHC: 33.2 g/dL (ref 30.0–36.0)
MCV: 90.3 fL (ref 80.0–100.0)
MCV: 93.4 fL (ref 80.0–100.0)
Platelets: 119 10*3/uL — ABNORMAL LOW (ref 150–400)
Platelets: 128 10*3/uL — ABNORMAL LOW (ref 150–400)
RBC: 2.88 MIL/uL — ABNORMAL LOW (ref 3.87–5.11)
RBC: 3.19 MIL/uL — ABNORMAL LOW (ref 3.87–5.11)
RDW: 14.6 % (ref 11.5–15.5)
RDW: 14.7 % (ref 11.5–15.5)
WBC: 6 10*3/uL (ref 4.0–10.5)
WBC: 6 10*3/uL (ref 4.0–10.5)
nRBC: 0 % (ref 0.0–0.2)
nRBC: 0 % (ref 0.0–0.2)

## 2021-01-20 LAB — GLUCOSE, CAPILLARY
Glucose-Capillary: 198 mg/dL — ABNORMAL HIGH (ref 70–99)
Glucose-Capillary: 215 mg/dL — ABNORMAL HIGH (ref 70–99)
Glucose-Capillary: 82 mg/dL (ref 70–99)
Glucose-Capillary: 83 mg/dL (ref 70–99)
Glucose-Capillary: 89 mg/dL (ref 70–99)
Glucose-Capillary: 93 mg/dL (ref 70–99)

## 2021-01-20 LAB — FERRITIN: Ferritin: 166 ng/mL (ref 11–307)

## 2021-01-20 MED ORDER — ACETAMINOPHEN 325 MG PO TABS
650.0000 mg | ORAL_TABLET | Freq: Four times a day (QID) | ORAL | Status: DC | PRN
  Administered 2021-01-20 – 2021-01-22 (×4): 650 mg via ORAL
  Filled 2021-01-20 (×4): qty 2

## 2021-01-20 NOTE — Hospital Course (Addendum)
Feels better this morning. Has not had a BM yet. Otherwise, no concerns or questions at this time. Spoke with CSW, still working on finding a rehab facility at this time.

## 2021-01-20 NOTE — Progress Notes (Addendum)
Subjective:  Overnight Events: No acute overnight events or concerns.  RN reports no signs of GI bleeding overnight.  Patient was seen and examined on rounds.  She reports feeling all right this morning.  Denies dizziness and lightheadedness.  Patient is unsure if she takes Protonix at home.  Denies abdominal pain, nausea, vomiting.  No documented BMs yet this morning.  Objective:  Vital signs in last 24 hours: Vitals:   01/20/21 0453 01/20/21 0614 01/20/21 0823 01/20/21 1011  BP: (!) 104/46  (!) 134/55 (!) 120/41  Pulse: 69  64 63  Resp: _0 Temp: 98.4 F (36.9 C)  98.8 F (37.1 C) 98.9 F (37.2 C)  TempSrc:   Oral Oral  SpO2: 100%  94% 93%  Weight:  63 kg    Height:         Intake/Output Summary (Last 24 hours) at 01/20/2021 1151 Last data filed at 01/20/2021 1009 Gross per 24 hour  Intake 240 ml  Output --  Net 240 ml    Physical Exam: General: Well appearing African-American female, NAD HENT: normocephalic, atraumatic, palatal pallor EYES: conjunctiva non-erythematous, no scleral icterus CV: regular rate, normal rhythm, no murmurs, rubs, gallops. Pulmonary: normal work of breathing on RA, lungs clear to auscultation, no rales, wheezes, rhonchi Abdominal: non-distended, soft, non-tender to palpation, normal BS Skin: Warm and dry, no rashes or lesions Neurological: MS: awake, alert, normal speech and fund of knowledge Motor: moves all extremities antigravity Psych: normal affect  Assessment/Plan:  Principal Problem:   GI bleed Active Problems:   HHT (hereditary hemorrhagic telangiectasia) ([name]   Type 2 diabetes mellitus without complication ([name]   PAF (paroxysmal atrial fibrillation) ([name]  #Hematemesis 2/2 UGIB #Bleeding gastric ulcer s/p clipping #Hx hereditary hemorrhagic telangectasia syndrome GI has recommended IR intervention if bleeding continues.  Hgb 9.1 this AM.  Stable.  No additional signs of bleeding.  Plan: -GI consult,  appreciate recs -Clear liquid diet, advance as tolerated -Protonix infusion at 14m/hr -CBC 6PM, then daily -PT eval and treat   #T2DM with Hyperglycemia #Diabetic neuropathy Patient taking small amounts of insulin in addition to Trulicity at home. Last A1C 7.8 on 11/2020.  -Continue sensitive SSI -Continue home gabapentin 100 mg twice daily   #AKI on CKD IIIa Baseline Cr appears to be around 1.0, with an eGFR of ~50. Cr currently 1.41, with eGFR of 38, likely secondary to hypoperfusion.   #History of seizures Continue home Keppra 5057mtwice daily   #HFpEF Last ECHO 09/2020 showed EF 65 to 70% with grade 2 diastolic dysfunction.  Patient on torsemide 20 mg daily at home.  Holding in the setting of soft BPs.  Grossly euvolemic on exam.  #HTN Currently normotensive.  Can consider restarting home amlodipine 2.5 mg daily if pressures elevate.  #GERD On Protonix 40 twice daily.  Patient unsure if she takes this.  Husband helps with medications.  Was recommended to be tested for H. pylori outpatient at discharge from prior hospitalization.  #MCI #AMS in the setting of acute blood loss anemia 2/2 UGIB Memory score from 11/2020 on MMSE 17/30.  Has previously been on Aricept.  Currently on Namenda 10 mg twice daily.  No formal diagnosis of dementia.  No family history of Alzheimer's. Plan: -Continue Namenda 10 mg twice daily  Diet: CLD VTE: SCDs IVF: None Code: Full  Prior to Admission Living Arrangement: Home Anticipated Discharge Location: Home Barriers to Discharge: Monitoring for further bleeding, on IV PPI gtt, advancing  diet Dispo: Anticipated discharge in approximately 1-2 day(s).   Portions of this report may have been transcribed using voice recognition software. Every effort was made to ensure accuracy; however, inadvertent computerized transcription errors may be present.    , MD 01/20/21,  11:51 AM Pager: [phone] Internal Medicine Resident,  PGY-1 [facility]         

## 2021-01-20 NOTE — Plan of Care (Signed)
  Problem: Clinical Measurements: Goal: Respiratory complications will improve Outcome: Progressing   Problem: Clinical Measurements: Goal: Cardiovascular complication will be avoided Outcome: Progressing   Problem: Nutrition: Goal: Adequate nutrition will be maintained Outcome: Progressing   

## 2021-01-20 NOTE — Progress Notes (Signed)
[facility] Progress Note  [name] 80 y.o. [birth year]  CC: GI bleed   Subjective: Patient states she is feeling good today.  Denies nausea/vomiting.  Has had a stool since yesterday, though unsure what color.  Denies abdominal pain.  States that she is hungry and she would like to try to eat something if possible.  ROS : Review of Systems  Gastrointestinal:  Negative for abdominal pain, blood in stool, constipation, diarrhea, heartburn, melena, nausea and vomiting.  Genitourinary:  Negative for dysuria and urgency.     Objective: Vital signs in last 24 hours: Vitals:   01/20/21 0453 01/20/21 0823  BP: (!) 104/46 (!) 134/55  Pulse: 69 64  Resp: 20 18  Temp: 98.4 F (36.9 C) 98.8 F (37.1 C)  SpO2: 100% 94%    Physical Exam:  General:  Alert, cooperative, no distress, appears stated age  Head:  Normocephalic, without obvious abnormality, atraumatic  Eyes:  Anicteric sclera, EOM's intact, conjunctival pallor  Lungs:   Clear to auscultation bilaterally, respirations unlabored  Heart:  Regular rate and rhythm, S1, S2 normal  Abdomen:   Soft, non-tender, bowel sounds active all four quadrants,  no masses,   Extremities: Extremities normal, atraumatic, no  edema  Pulses: 2+ and symmetric    Lab Results: Recent Labs    01/19/21 0156 01/20/21 0656  NA 140 144  K 4.1 4.0  CL 111 112*  CO2 23 25  GLUCOSE 164* 90  BUN 31* 20  CREATININE 1.35* 1.41*  CALCIUM 8.6* 8.4*   Recent Labs    01/17/21 1450 01/18/21 0628  AST 41 35  ALT 34 27  ALKPHOS 146* 108  BILITOT 0.8 0.8  PROT 6.5 5.1*  ALBUMIN 3.2* 2.6*   Recent Labs    01/17/21 1450 01/18/21 0628 01/18/21 1738 01/19/21 1808 01/20/21 0656  WBC 7.3 12.4*   < > 8.6 6.0  NEUTROABS 5.2 10.4*  --   --   --   HGB 12.1 9.8*   < > 9.9* 9.1*  HCT 33.9* 27.7*   < > 28.7* 26.0*  MCV 85.0 88.8   < > 89.1 90.3  PLT 142* 147*   < > 102* 119*   < > = values in this interval not displayed.   Recent  Labs    01/18/21 0628  LABPROT 14.4  INR 1.1      Assessment GI bleed;  - EGD 12/11: normal esophagus, oozing gastric ulcer with pigmented material, injected, clips placed. Gastritis. Normal duodenum. No specimens collected - Hgb 9.1 (9.9 yesterday), MCV normal - BUN 20, Cr 1.41  Plan: Continue daily CBC and transfuse as needed to maintain HGB > 7  Continue Protonix infusion Can try clear liquid diet and advance as tolerated. Will continue to monitor until no more coffee-ground emesis/melena.  Though currently appears patient is clinically improving s/p EGD intervention [facility] will follow  Garnette Scheuermann PA-C 01/20/2021, 9:46 AM  Contact #  [phone]

## 2021-01-20 NOTE — Progress Notes (Signed)
Mobility Specialist Criteria Algorithm Info.   01/20/21 1133  Mobility  Activity Ambulated in hall (In bed before to chair after)  Range of Motion/Exercises Active;All extremities  Level of Assistance Standby assist, set-up cues, supervision of patient - no hands on  Assistive Device Front wheel walker  Distance Ambulated (ft) 120 ft  Mobility Ambulated with assistance in hallway  Mobility Response Tolerated well  Mobility performed by Mobility specialist  Bed Position Chair   Patient received in bed eager to participate in mobility. Got to EOB independently and required minimal HHA to boost into standing. Ambulated in hallway supervision level with slow steady gait. Returned to and agreed to sit in Financial trader. Was left in chair with all needs met and call bell in reach.  01/20/2021 4:42 PM

## 2021-01-21 ENCOUNTER — Encounter (HOSPITAL_COMMUNITY): Payer: Self-pay | Admitting: Gastroenterology

## 2021-01-21 LAB — GLUCOSE, CAPILLARY
Glucose-Capillary: 111 mg/dL — ABNORMAL HIGH (ref 70–99)
Glucose-Capillary: 122 mg/dL — ABNORMAL HIGH (ref 70–99)
Glucose-Capillary: 160 mg/dL — ABNORMAL HIGH (ref 70–99)
Glucose-Capillary: 168 mg/dL — ABNORMAL HIGH (ref 70–99)
Glucose-Capillary: 287 mg/dL — ABNORMAL HIGH (ref 70–99)
Glucose-Capillary: 48 mg/dL — ABNORMAL LOW (ref 70–99)

## 2021-01-21 LAB — CBC
HCT: 27.3 % — ABNORMAL LOW (ref 36.0–46.0)
Hemoglobin: 9 g/dL — ABNORMAL LOW (ref 12.0–15.0)
MCH: 30.4 pg (ref 26.0–34.0)
MCHC: 33 g/dL (ref 30.0–36.0)
MCV: 92.2 fL (ref 80.0–100.0)
Platelets: 123 10*3/uL — ABNORMAL LOW (ref 150–400)
RBC: 2.96 MIL/uL — ABNORMAL LOW (ref 3.87–5.11)
RDW: 14.6 % (ref 11.5–15.5)
WBC: 5.5 10*3/uL (ref 4.0–10.5)
nRBC: 0 % (ref 0.0–0.2)

## 2021-01-21 LAB — BASIC METABOLIC PANEL
Anion gap: 6 (ref 5–15)
BUN: 15 mg/dL (ref 8–23)
CO2: 23 mmol/L (ref 22–32)
Calcium: 8.3 mg/dL — ABNORMAL LOW (ref 8.9–10.3)
Chloride: 109 mmol/L (ref 98–111)
Creatinine, Ser: 1.44 mg/dL — ABNORMAL HIGH (ref 0.44–1.00)
GFR, Estimated: 37 mL/min — ABNORMAL LOW (ref >60)
Glucose, Bld: 55 mg/dL — ABNORMAL LOW (ref 70–99)
Potassium: 3.6 mmol/L (ref 3.5–5.1)
Sodium: 138 mmol/L (ref 135–145)

## 2021-01-21 MED ORDER — TORSEMIDE 20 MG PO TABS
20.0000 mg | ORAL_TABLET | Freq: Every day | ORAL | Status: DC
  Administered 2021-01-22: 10:00:00 20 mg via ORAL
  Filled 2021-01-21: qty 1

## 2021-01-21 MED ORDER — INSULIN ASPART 100 UNIT/ML IJ SOLN
0.0000 [IU] | Freq: Three times a day (TID) | INTRAMUSCULAR | Status: DC
  Administered 2021-01-21: 3 [IU] via SUBCUTANEOUS
  Administered 2021-01-22 (×2): 1 [IU] via SUBCUTANEOUS

## 2021-01-21 MED ORDER — PANTOPRAZOLE SODIUM 40 MG PO TBEC
40.0000 mg | DELAYED_RELEASE_TABLET | Freq: Two times a day (BID) | ORAL | Status: DC
  Administered 2021-01-21 – 2021-01-22 (×3): 40 mg via ORAL
  Filled 2021-01-21 (×3): qty 1

## 2021-01-21 NOTE — Progress Notes (Signed)
Mobility Specialist Criteria Algorithm Info.   01/21/21 1140  Mobility  Activity Ambulated in hall (in chair before and after ambulation)  Range of Motion/Exercises Active;All extremities  Level of Assistance Standby assist, set-up cues, supervision of patient - no hands on (Min A to boost into standing)  Games developer wheel walker  Distance Ambulated (ft) 200 ft  Mobility Ambulated with assistance in hallway  Mobility Response Tolerated well  Mobility performed by Mobility specialist  Bed Position Chair   Patient received in recliner chair eager to participate in mobility. Required min HHA to boost into standing. Ambulated in hallway supervision level with steady gait. Returned to room without complaint or incident. Was left in chair with all needs met and call bell in reach.  01/21/2021 4:16 PM

## 2021-01-21 NOTE — Progress Notes (Signed)
Physical Therapy Treatment Patient Details Name: KARYSSA AMARAL MRN: [id number] DOB: June 07, 1940 Today's Date: 01/21/2021   History of Present Illness This episode of care, pt is admitted for GI bleed, s/p endoscopy on 12/11 with successful hemostasis; PMH significant for Gastric ulcer, GI bleeds, anemia, HTN, DM2, dementia, and recent hospitalizations over the past month    PT Comments    Patient progressing well towards PT goals. Requires Min A for standing transfers and Min guard assist for overall mobility/gait with use of RW. Reporting LUE pain while in bed with sensitivity to touch however no more complaints when up mobilizing. Improved ambulation distance this session with slow gait speed putting pt at increased risk for falls. Continues to be limited by weakness, decreased endurance and cognition. Will follow.   Recommendations for follow up therapy are one component of a multi-disciplinary discharge planning process, led by the attending physician.  Recommendations may be updated based on patient status, additional functional criteria and insurance authorization.  Follow Up Recommendations  Skilled nursing-short term rehab (<3 hours/day)     Assistance Recommended at Discharge Frequent or constant Supervision/Assistance  Equipment Recommendations  None recommended by PT    Recommendations for Other Services       Precautions / Restrictions Precautions Precautions: Fall Restrictions Weight Bearing Restrictions: No     Mobility  Bed Mobility Overal bed mobility: Needs Assistance Bed Mobility: Supine to Sit     Supine to sit: Min assist;HOB elevated     General bed mobility comments: increased time to transfer to side of bed; phsycial assist to help scoot to eOB and square off hips    Transfers Overall transfer level: Needs assistance Equipment used: Rolling walker (2 wheels) Transfers: Sit to/from Stand Sit to Stand: Min assist           General transfer  comment: Min A to power to standind from EOB and Min guard for toilet transfer using rails.    Ambulation/Gait Ambulation/Gait assistance: Min guard Gait Distance (Feet): 100 Feet Assistive device: Rolling walker (2 wheels) Gait Pattern/deviations: Step-through pattern;Decreased stride length;Shuffle;Trunk flexed Gait velocity: .9 ft/sec Gait velocity interpretation: <1.31 ft/sec, indicative of household ambulator   General Gait Details: Very slow, mostly steady gait with cues for upright posture and RW proximity. No tremulousness in knees noted today.   Stairs             Wheelchair Mobility    Modified Rankin (Stroke Patients Only)       Balance Overall balance assessment: Mild deficits observed, not formally tested Sitting-balance support: Feet supported;No upper extremity supported Sitting balance-Leahy Scale: Fair     Standing balance support: During functional activity;Reliant on assistive device for balance Standing balance-Leahy Scale: Poor                              Cognition Arousal/Alertness: Awake/alert Behavior During Therapy: WFL for tasks assessed/performed Overall Cognitive Status: Impaired/Different from baseline Area of Impairment: Memory;Safety/judgement;Problem solving                     Memory: Decreased short-term memory   Safety/Judgement: Decreased awareness of safety   Problem Solving: Slow processing;Requires verbal cues General Comments: Patient alert to self. and following all instructions. Reports LUE pain upon arrival but able to  use it functionally and no reports of pain throughout rest of session.        Exercises  General Comments        Pertinent Vitals/Pain Pain Assessment: Faces Faces Pain Scale: Hurts even more Pain Location: LUE Pain Descriptors / Indicators: Grimacing;Guarding;Sore Pain Intervention(s): Monitored during session;Repositioned    Home Living                           Prior Function            PT Goals (current goals can now be found in the care plan section) Progress towards PT goals: Progressing toward goals    Frequency    Min 3X/week      PT Plan Current plan remains appropriate    Co-evaluation              AM-PAC PT "6 Clicks" Mobility   Outcome Measure  Help needed turning from your back to your side while in a flat bed without using bedrails?: None Help needed moving from lying on your back to sitting on the side of a flat bed without using bedrails?: A [place] Help needed moving to and from a bed to a chair (including a wheelchair)?: A [place]son Help needed standing up from a chair using your arms (e.g., wheelchair or bedside chair)?: A [place] Help needed to walk in hospital room?: A [place] Help needed climbing 3-5 steps with a railing? : A Lot 6 Click Score: 18    End of Session Equipment Utilized During Treatment: Gait belt Activity Tolerance: Patient limited by lethargy Patient left: in chair;with call bell/phone within reach;with nursing/sitter in room;with chair alarm set Nurse Communication: Mobility status;Patient requests pain meds PT Visit Diagnosis: Muscle weakness (generalized) (M62.81);Difficulty in walking, not elsewhere classified (R26.2);Unsteadiness on feet (R26.81);Pain Pain - Right/Left: Left Pain - part of body: Arm;Shoulder     Time: 3016-0109 PT Time Calculation (min) (ACUTE ONLY): 23 min  Charges:  $Gait Training: 8-22 mins $Therapeutic Activity: 8-22 mins                     Marisa Severin, PT, DPT Acute Rehabilitation Services Pager [phone] Office [place] 01/21/2021, 9:35 AM

## 2021-01-21 NOTE — Plan of Care (Signed)
°  Problem: Clinical Measurements: Goal: Ability to maintain clinical measurements within normal limits will improve Outcome: Progressing   Problem: Clinical Measurements: Goal: Respiratory complications will improve Outcome: Progressing   Problem: Nutrition: Goal: Adequate nutrition will be maintained Outcome: Progressing   Problem: Pain Managment: Goal: General experience of comfort will improve Outcome: Progressing   Problem: Safety: Goal: Ability to remain free from injury will improve Outcome: Progressing

## 2021-01-21 NOTE — Progress Notes (Signed)
Subjective:  Overnight Events: No acute overnight events or concerns.  RN reports no signs of GI bleeding overnight.  Doing well today. Was able to eat more than half of her mechanical soft breakfast today with no difficulty. No nausea, vomiting, abdominal pain, dizziness, lightheadedness.  Had 1 small BM today, she cannot recall the color.  No substantial BM yet.  Discussed plan for temporary rehab stay prior to discharge home.  Patient and family are in agreement.  Objective:  Vital signs in last 24 hours: Vitals:   01/20/21 1540 01/20/21 2028 01/21/21 0300 01/21/21 0845  BP: (!) 110/46 (!) 127/50 121/64 (!) 120/51  Pulse: 60 (!) 52 (!) 55 (!) 56  Resp: _0 Temp: 98.6 F (37 C) 98.6 F (37 C) 98.6 F (37 C) 97.9 F (36.6 C)  TempSrc: Oral Oral Oral   SpO2: 98% 100% 98% 99%  Weight:      Height:         Intake/Output Summary (Last 24 hours) at 01/21/2021 1594 Last data filed at 01/20/2021 2058 Gross per 24 hour  Intake 240 ml  Output --  Net 240 ml    Physical Exam: General: Well appearing African-American female, NAD HENT: normocephalic, atraumatic, palatal pallor EYES: conjunctiva non-erythematous, no scleral icterus CV: regular rate, normal rhythm, no murmurs, rubs, gallops.  1+ pitting edema bilateral lower extremities Pulmonary: normal work of breathing on RA, lungs clear to auscultation, no rales, wheezes, rhonchi Abdominal: non-distended, soft, non-tender to palpation, normal BS Skin: Warm and dry, no rashes or lesions Neurological: MS: awake, alert, normal speech and fund of knowledge Motor: moves all extremities antigravity Psych: normal affect  Assessment/Plan:  Principal Problem:   GI bleed Active Problems:   HHT (hereditary hemorrhagic telangiectasia) ([name]   Type 2 diabetes mellitus without complication ([name]   CKD (chronic kidney disease), stage III ([name]   PAF (paroxysmal atrial fibrillation) ([name]  #Hematemesis 2/2 UGIB #Bleeding  gastric ulcer s/p clipping #Hx hereditary hemorrhagic telangectasia syndrome #Hx iron deficiency anemia GI has recomm hemoglobin this morning 9.0 which is stable over the last 24 hours.  No signs or symptoms of GI bleeding. GI has signed off.  Recommended p.o. Protonix 40 mg twice daily and GI follow-up 1 month after discharge. Patient has appointment in January with heme-onc for next iron transfusion. Just received iron transfusion 12/9 right before admission. Plan: -GI signed off, appreciate recs -Mechanical soft, will advance as tolerated -Home p.o. Protonix 40 mg twice daily -Daily AM CBCs -PT/OT evaluations, PT skilled for SNF, TOC consulted for SNF placement -Outpatient follow-up GI, heme-onc   #T2DM with Hyperglycemia #Diabetic Neuropathy Patient taking Trulicity at home, as well as using Humalog mix 75/25 8 units daily, lispro 3 units before dinner. Last A1C 7.8 on 11/2020.  Patient CBGs intermittently as high as 215 or as low as 48. Plan: -Very sensitive SSI -CBGs AC and at bedtime -Continue home Gabapentin 100 mg twice daily   #CKD IIIb After last few hospitalizations this fall and winter, creatinine baseline appears to have dropped to ~1.3 with eGFR ~35-40.  Cr currently 1.44, with eGFR of 37.  This is likely progression of her CKD and not AKI. We will hold off on fluids at this time given history of heart failure and 1+ peripheral edema on exam. Plan: -Continue to monitor renal function   #History of seizures Continue home Keppra 569m twice daily.  No evidence of seizure activity.   #HFpEF Last ECHO 09/2020 showed  EF 65 to 70% with grade 2 diastolic dysfunction.  Patient on torsemide 20 mg daily at home.  1+ pitting edema bilateral lower extremities, will continue to monitor volume status. Plan: -Restarted home torsemide 20 mg daily, first dose tomorrow -Continue to monitor volume status  #HTN Currently normotensive.  Can consider restarting home amlodipine 2.5 mg daily  if pressures elevate.  #GERD On Protonix 70m twice daily at home.  Family endorses compliance with this medication.  Patient was continued on Protonix 40 mg twice daily.  #Hx MCI #AMS in the setting of acute blood loss anemia 2/2 UGIB, resolved Memory score from 11/2020 on MMSE 17/30.  Has previously been on Aricept.  Currently on Namenda 10 mg twice daily.  No formal diagnosis of dementia.  No family history of Alzheimer's.  Patient is back to her mental status baseline. Plan: -Continue Namenda 10 mg twice daily  Diet: Mechanical soft VTE: SCDs IVF: None Code: Full  Prior to Admission Living Arrangement: Home Anticipated Discharge Location: Home Barriers to Discharge: SNF placement Dispo: Anticipated discharge in approximately 1 day(s).   Portions of this report may have been transcribed using voice recognition software. Every effort was made to ensure accuracy; however, inadvertent computerized transcription errors may be present.   EWayland Denis MD 01/21/21,  8:51 AM Pager: [phone] Medicine Resident, PGY-1 [facility]

## 2021-01-21 NOTE — NC FL2 (Signed)
[place] MEDICAID FL2 LEVEL OF CARE SCREENING TOOL     IDENTIFICATION  Patient Name: [name] Birthdate: 04-05-1940 Sex: female Admission Date (Current Location): 01/18/2021  [place] and [place] Number:  Kathleen Argue [id number] [place] and Address:  [facility] [address], [place], [place] [address]      Provider Number: [id number]  Attending Physician Name and Address:  Lucious Groves, DO  Relative Name and Phone Number:  Zani, Kyllonen [phone]    Current Level of Care: Hospital Recommended Level of Care: [place] Prior Approval Number:    Date Approved/Denied:   PASRR Number: [id number]  Discharge Plan: SNF    Current Diagnoses: Patient Active Problem List   Diagnosis Date Noted   GI bleed 01/18/2021   Bullous lesion 10/08/2020   Hearing loss 05/29/2020   Neuropathy 07/27/2019   Hyperkalemia 10/18/2018   Port-A-Cath in place 05/23/2018   Iron deficiency anemia due to chronic blood loss 04/19/2018   Acute right hip pain 03/10/2018   Hyperpigmented skin lesion 02/07/2018   Leg weakness, bilateral 12/23/2017   Hematemesis 12/04/2017   Pain localized to upper abdomen 10/05/2017   Lower extremity pain, bilateral 07/15/2017   Pulmonary HTN ([place]) 04/01/2017   Heart murmur 04/01/2017   Overgrown toenails 03/18/2017   Osteopenia after menopause 01/13/2017   Epistaxis 07/30/2016   Venous insufficiency of both lower extremities 04/27/2016   Anemia due to chronic blood loss    Dry skin 02/17/2016   Peripheral vascular disease ([place]) 12/30/2015   Pain in joint of right shoulder 10/22/2015   PAF (paroxysmal atrial fibrillation) ([place]) 45/80/9983   Lichen planus 38/25/0539   Healthcare maintenance 06/20/2015   Seborrheic keratosis 06/20/2015   Memory loss 01/30/2015   Simple febrile convulsions ([place]) 10/21/2014   Hyperammonemia ([place]) 10/03/2014   CKD (chronic kidney disease), stage III ([name]    Seizures  ([place]) 09/2014   Acute kidney injury ([place]) 08/27/2014   Chronic diastolic CHF (congestive heart failure) ([place]) 10/03/2013   Acute on chronic heart failure with preserved ejection fraction (HFpEF) ([place]) 10/03/2013   Type 2 diabetes mellitus without complication ([place]) 76/73/4193   HTN (hypertension), benign 03/02/2012   Fatigue 07/10/2011   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   HHT (hereditary hemorrhagic telangiectasia) ([place]) 02/01/2011    Orientation RESPIRATION BLADDER Height & Weight     Self, Time, Situation, Place  Normal Continent, External catheter Weight: 138 lb 14.2 oz (63 kg) Height:  5\' 3"  (160 cm)  BEHAVIORAL SYMPTOMS/MOOD NEUROLOGICAL BOWEL NUTRITION STATUS    Convulsions/Seizures Continent Diet (see discharge summary)  AMBULATORY STATUS COMMUNICATION OF NEEDS Skin   Independent Verbally Normal                       Personal Care Assistance Level of Assistance  Bathing, Feeding, Dressing Bathing Assistance: Independent Feeding assistance: Independent Dressing Assistance: Independent     Functional Limitations Info  Sight, Hearing, Speech Sight Info: Adequate Hearing Info: Adequate Speech Info: Adequate    SPECIAL CARE FACTORS FREQUENCY  PT (By licensed PT), OT (By licensed OT)     PT Frequency: 5x week OT Frequency: 5x week            Contractures Contractures Info: Not present    Additional Factors Info  Code Status, Allergies Code Status Info: full Allergies Info: aspirin           Current Medications (01/21/2021):  This is the current hospital active medication list Current  Facility-Administered Medications  Medication Dose Route Frequency Provider Last Rate Last Admin   acetaminophen (TYLENOL) tablet 650 mg  650 mg Oral Q6H PRN Timothy Lasso, MD   650 mg at 01/21/21 6045   Chlorhexidine Gluconate Cloth 2 % PADS 6 each  6 each Topical Daily [name], MD   6 each at 01/21/21 1113   gabapentin (NEURONTIN) capsule 100 mg  100 mg Oral BID  [name], MD   100 mg at 01/21/21 0820   insulin aspart (novoLOG) injection 0-6 Units  0-6 Units Subcutaneous TID WC Virl Axe, MD       levETIRAcetam (KEPPRA) tablet 500 mg  500 mg Oral BID Corky Sox, MD   500 mg at 01/21/21 0820   memantine (NAMENDA) tablet 10 mg  10 mg Oral BID [name], MD   10 mg at 01/21/21 0821   pantoprazole (PROTONIX) EC tablet 40 mg  40 mg Oral BID Wayland Denis, MD   40 mg at 01/21/21 1533   sodium chloride flush (NS) 0.9 % injection 10-40 mL  10-40 mL Intracatheter Q12H Axel Filler, MD   10 mL at 01/19/21 1430   sodium chloride flush (NS) 0.9 % injection 10-40 mL  10-40 mL Intracatheter PRN Axel Filler, MD       [START ON 01/22/2021] torsemide [facility]) tablet 20 mg  20 mg Oral Daily Wayland Denis, MD       Facility-Administered Medications Ordered in Other Encounters  Medication Dose Route Frequency Provider Last Rate Last Admin   sodium chloride 0.9 % injection 10 mL  10 mL Intracatheter PRN Truitt Merle, MD       sodium chloride flush (NS) 0.9 % injection 10 mL  10 mL Intracatheter PRN Truitt Merle, MD   10 mL at 08/18/19 1717     Discharge Medications: Please see discharge summary for a list of discharge medications.  Relevant Imaging Results:  Relevant Lab Results:   Additional Information SSN: [id number]. Pt reports she is vaccinated for covid but unsure of how many shots/boosters she has had.  Joanne Chars, LCSW

## 2021-01-21 NOTE — Progress Notes (Signed)
Inpatient Diabetes Program Recommendations  AACE/ADA: New Consensus Statement on Inpatient Glycemic Control   Target Ranges:  Prepandial:   less than 140 mg/dL      Peak postprandial:   less than 180 mg/dL (1-2 hours)      Critically ill patients:  140 - 180 mg/dL    Latest Reference Range & Units 01/20/21 08:22 01/20/21 11:55 01/20/21 16:27 01/20/21 20:27 01/21/21 00:41 01/21/21 05:07 01/21/21 06:16  Glucose-Capillary 70 - 99 mg/dL 89 93 198 (H) 215 (H) 122 (H) 48 (L) 111 (H)   Review of Glycemic Control  Current orders for Inpatient glycemic control: Novolog 0-9 units Q4H  Inpatient Diabetes Program Recommendations:    Insulin: May want to consider decreasing Novolog correction to 0-6 units Q4H. If patient is eating well, may want to change frequency of CBGs and Novolog from Q4H to AC&HS.  Thanks, Barnie Alderman, RN, MSN, CDE Diabetes Coordinator Inpatient Diabetes Program [phone] (Team Pager from 8am to 5pm)

## 2021-01-21 NOTE — Discharge Summary (Signed)
Name: [name] MRN: [id number] DOB: 1941/01/08 80 y.o. 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 What changed: Another medication with the same name was changed. Make sure you understand how and when to take each.   Accu-Chek Guide test strip Generic drug: glucose blood USE AS INSTRUCTED TO CHECK BLOOD SUGAR 3 TIMES DAILY. What changed: See the new instructions.   Accu-Chek Softclix Lancets lancets 1 each by Other route 3 (three) times daily. Use as instructed to check blood sugar 3 times per day dx code E11.65   Lancets Misc 1 each by Other route daily.   amLODipine 2.5 MG tablet Commonly known as: NORVASC Take 1 tablet (2.5 mg total) by mouth every morning.   B-D UF III MINI PEN NEEDLES 31G X 5 MM Misc Generic drug: Insulin Pen Needle USE TO INJECT INSULIN THREE TIMES A DAY What changed:  how much to take how to take this when to take this   diclofenac Sodium 1 % Gel Commonly known as: VOLTAREN Apply 2 g topically 4 (four) times daily. What changed:  when to take this reasons to take this   FreeStyle Libre 2 Sensor Misc 2 Devices by Does not apply route every 14 (fourteen) days.   gabapentin 100 MG capsule Commonly known as: NEURONTIN TAKE 1 CAPSULE BY MOUTH TWICE DAILY   HumaLOG Mix 75/25 KwikPen (75-25) 100 UNIT/ML Kwikpen Generic drug: Insulin Lispro Prot & Lispro INJECT 10 UNITS SUBCUTANEOUSLY ONCE DAILY What changed: See the new instructions.    IMMUNE FORMULA PO Take 1 tablet by mouth daily.   insulin lispro 100 UNIT/ML KwikPen Commonly known as: HumaLOG KwikPen 3 units before dinner What changed:  how much to take how to take this when to take this additional instructions   levETIRAcetam 500 MG tablet Commonly known as: KEPPRA TAKE 1 TABLET BY MOUTH TWICE DAILY   Medical Compression Stockings Misc Wear as much as possible while awake to reduce swelling   memantine 10 MG tablet Commonly known as: NAMENDA TAKE 1 TABLET BY MOUTH TWICE DAILY   Multi For Her 50+ Tabs Take 1 tablet by mouth daily with breakfast.   pantoprazole 40 MG tablet Commonly known as: PROTONIX Take 1 tablet (40 mg total) by mouth 2 (two) times daily.   potassium chloride SA 20 MEQ tablet Commonly known as: KLOR-CON M Take 20 mEq by mouth 2 (two) times daily.   torsemide 20 MG tablet Commonly known as: DEMADEX Take 20 mg by mouth daily.   triamcinolone ointment 0.5 % Commonly known as: KENALOG APPLY 1 APPLICATION TOPICALLY TO RASH TWICE DAILY FOR ITCHING   Trulicity 1.5 MP/5.3IR Sopn Generic drug: Dulaglutide INJECT 1.5MG  SUBCUTANEOUSLY EVERY WEEK ON THURSDAY What changed: See the new instructions.        Disposition and follow-up:   [name] was discharged from [facility] in Stable condition.  At the hospital follow up visit please address:  UGIB: Bleeding gastric ulcer s/p clipping: Hemoglobin stable ~9 at discharge.  Please ensure patient has had no further signs of GI bleeding.  Obtain CBC to ensure hemoglobin stable.  Make sure patient has follow-up appointment with GI.  Patient also gets iron transfusions for IDA through heme-onc, next appointment in January.  Labs / imaging needed at time of follow-up: CBC  Pending labs/ test needing follow-up: none  Follow-up Appointments:  Follow-up Information     Gastroenterology, [facility]. Schedule an appointment as soon as possible for a visit in 1  month(s).   Contact information: [address] [place] [address] [phone]         [name], Areeg N, DO Follow up.   Specialty: Internal Medicine Why: We will make a follow-up appointment for you with our internal medicine clinic within 1 to 2 weeks after discharge.  You should receive a phone call from our office with the appointment date and time.  If you do not hear from [place] please give our office a call at the number above. Contact information: [address] [place] [address] [place] Hospital Course by Problem List:  [name] is an 80 year old African-American female with past medical history of hereditary hemorrhagic telangiectasias, recent hospitalization with severe GI bleeding from a gastric ulcer, recent seizures and metabolic encephalopathy, admitted to IMTS for UGIB from gastric ulcer now s/p clipping.  #Hematemesis 2/2 UGIB #Bleeding gastric ulcer s/p clipping #Hx hereditary hemorrhagic telangectasia syndrome #Hx iron deficiency anemia Patient presented from home for episodes of hematemesis.  In the ED patient also had episodes of melena.  GI was consulted for concern for UGIB.  On arrival patient's hemoglobin 12.1 with acute blood loss anemia two 9.8 then 7.4 while in the ED, requiring 1U PRBCs.  GI performed EGD which identified a bleeding gastric ulcer on the lesser curvature of the stomach, now status post clipping.  Since EGD, patient's hemoglobin has remained stable ~9, without additional signs of bleeding.  Patient was on Protonix gtt, transitioned to her home p.o. Protonix dose 40 mg twice daily.  Initially patient was started on CLD which was advanced to mechanical soft.  Patient was skilled for SNF by PT/OT.  Patient will need to follow-up with her PCP and continue to follow-up with her outpatient iron infusions.  Patient's last iron infusion 12/9, just prior to admission.   #T2DM with  Hyperglycemia #Diabetic neuropathy Patient taking small amounts of insulin in addition to Trulicity at home. Last A1C 7.8 on 11/2020.  Blood sugars occasionally dropped low on sensitive sliding scale, was switched to very sensitive sliding scale.  Patient was continued on home gabapentin 100 mg twice daily.  Patient will discharge on home medications.  Patient follows with endocrinology Dr. Dwyane Dee, has next appointment in January.   #CKD IIIa After last few hospitalizations this fall and winter, creatinine baseline appears to have dropped to ~1.3 with eGFR ~35-40.  Cr currently 1.44, with eGFR of 37.  This is likely progression of her CKD   #History of seizures Continued on home Keppra $RemoveBef'500mg'qmbUPMlRdq$  twice daily. No evidence of seizure activity this admission.   #HFpEF Last ECHO 09/2020 showed EF 65 to 70% with grade 2 diastolic dysfunction. Patient on torsemide 20 mg daily at home.  Initially held in the setting of soft BPs.  Restarted 12/13. Grossly euvolemic on exam.   #HTN Currently  normotensive.  On amlodipine 2.5 mg daily at home.  Was held due to soft pressures this admission.  Will restart at discharge.   #GERD Home Protonix $RemoveBefor'40mg'TUoMtypeOukf$  twice daily initially held while on Protonix gtt then restarted.  Family endorses patient adherent to medication.   #MCI #AMS in the setting of acute blood loss anemia 2/2 UGIB, resolved Memory score from 11/2020 on MMSE 17/30.  Has previously been on Aricept.  Currently on Namenda 10 mg twice daily which was continued this admission.  No formal diagnosis of dementia.  No family history of Alzheimer's.  At mental status baseline at discharge.   Subjective on day of discharge: Patient reports feeling better this morning.  Reports feeling stronger.  States she has not had a bowel movement for the last 2 to 3 days.  Has had improvement in p.o. intake, and is tolerating solid foods without nausea, vomiting, abdominal pain.  Discussed starting stool softener and expecting BM  as her appetite has improved.  Discussed plan for likely discharge to SNF today.  Daughter at bedside, all questions answered.  Discharge Exam:   BP (!) 122/45 (BP Location: Right Arm)   Pulse (!) 55   Temp (!) 97.3 F (36.3 C)   Resp 16   Ht $R'5\' 3"'oy$  (1.6 m)   Wt 63 kg   LMP  (LMP Unknown)   SpO2 99%   BMI 24.60 kg/m  Discharge exam: General: Well appearing elderly African-American female, NAD HENT: normocephalic, atraumatic, palatal pallor EYES: conjunctiva non-erythematous, no scleral icterus CV: regular rate, normal rhythm, no murmurs, rubs, gallops.  1+ pitting edema bilateral lower extremities Pulmonary: normal work of breathing on RA, lungs clear to auscultation, no rales, wheezes, rhonchi Abdominal: non-distended, soft, non-tender to palpation, normal BS Skin: Warm and dry, no rashes or lesions Neurological: MS: awake, alert, normal speech and fund of knowledge Motor: moves all extremities antigravity Psych: normal affect   Pertinent Labs, Studies, and Procedures:  CBC Latest Ref Rng & Units 01/22/2021 01/21/2021 01/20/2021  WBC 4.0 - 10.5 K/uL 5.0 5.5 6.0  Hemoglobin 12.0 - 15.0 g/dL 9.5(L) 9.0(L) 9.9(L)  Hematocrit 36.0 - 46.0 % 27.3(L) 27.3(L) 29.8(L)  Platelets 150 - 400 K/uL 119(L) 123(L) 128(L)   CMP Latest Ref Rng & Units 01/22/2021 01/21/2021 01/20/2021  Glucose 70 - 99 mg/dL 203(H) 55(L) 90  BUN 8 - 23 mg/dL $Remove'12 15 20  'BYqQBIl$ Creatinine 0.44 - 1.00 mg/dL 1.36(H) 1.44(H) 1.41(H)  Sodium 135 - 145 mmol/L 137 138 144  Potassium 3.5 - 5.1 mmol/L 4.1 3.6 4.0  Chloride 98 - 111 mmol/L 109 109 112(H)  CO2 22 - 32 mmol/L $RemoveB'23 23 25  'HSoJSMDI$ Calcium 8.9 - 10.3 mg/dL 8.5(L) 8.3(L) 8.4(L)  Total Protein 6.5 - 8.1 g/dL - - -  Total Bilirubin 0.3 - 1.2 mg/dL - - -  Alkaline Phos 38 - 126 U/L - - -  AST 15 - 41 U/L - - -  ALT 0 - 44 U/L - - -     Discharge Instructions: Discharge Instructions     Diet Carb Modified   Complete by: As directed        Portions of this report  may have been transcribed using voice recognition software. Every effort was made to ensure accuracy; however, inadvertent computerized transcription errors may be present.   Wayland Denis, MD 01/22/21,  1:37 PM Pager: [phone] Internal Medicine Resident, PGY-1 [name]

## 2021-01-21 NOTE — Evaluation (Signed)
Occupational Therapy Evaluation Patient Details Name: [name] MRN: [id number] DOB: 11/05/1940 Today's Date: 01/21/2021   History of Present Illness This episode of care, pt is admitted for GI bleed, s/p endoscopy on 12/11 with successful hemostasis; PMH significant for Gastric ulcer, GI bleeds, anemia, HTN, DM2, dementia, and recent hospitalizations over the past month   Clinical Impression   PTA patient was living with her spouse in a private residence and was grossly Mod I with ADLs using RW. Niece assists with cooking/cleaning. Patient currently functioning below baseline demonstrating observed ADLs with Min guard to Min A grossly with use of RW. Patient also limited by deficits listed below including decreased activity tolerance and balance deficits and would benefit from continued acute OT services in prep for safe d/c to next level of care. Family is unable to provide necessary level of supervision/assist with recommendation for SNF rehab.        Recommendations for follow up therapy are one component of a multi-disciplinary discharge planning process, led by the attending physician.  Recommendations may be updated based on patient status, additional functional criteria and insurance authorization.   Follow Up Recommendations  Skilled nursing-short term rehab (<3 hours/day)    Assistance Recommended at Discharge Frequent or constant Supervision/Assistance  Functional Status Assessment  Patient has had a recent decline in their functional status and demonstrates the ability to make significant improvements in function in a reasonable and predictable amount of time.  Equipment Recommendations  Other (comment) (Defer to next level of care.)    Recommendations for Other Services       Precautions / Restrictions Precautions Precautions: Fall Restrictions Weight Bearing Restrictions: No      Mobility Bed Mobility Overal bed mobility: Needs Assistance              General bed mobility comments: Seated in recliner upon entry.    Transfers Overall transfer level: Needs assistance Equipment used: Rolling walker (2 wheels) Transfers: Sit to/from Stand Sit to Stand: Min assist           General transfer comment: Min A to boost for sit to stand from low recliner.      Balance Overall balance assessment: Mild deficits observed, not formally tested   Sitting balance-Leahy Scale: Fair       Standing balance-Leahy Scale: Poor                             ADL either performed or assessed with clinical judgement   ADL Overall ADL's : Needs assistance/impaired     Grooming: Supervision/safety;Standing   Upper Body Bathing: Supervision/ safety;Sitting   Lower Body Bathing: Min guard;Sitting/lateral leans;Sit to/from stand   Upper Body Dressing : Set up;Sitting   Lower Body Dressing: Min guard;Sitting/lateral leans;Sit to/from stand Lower Body Dressing Details (indicate cue type and reason): Able to don footwear seated in recliner with set-up assist. Toilet Transfer: Min guard;Ambulation;Regular Museum/gallery exhibitions officer and Hygiene: Min guard;Minimal assistance               Vision Baseline Vision/History: 1 Wears glasses Ability to See in Adequate Light: 1 Impaired Patient Visual Report: No change from baseline Vision Assessment?: No apparent visual deficits     Perception     Praxis      Pertinent Vitals/Pain Pain Assessment: No/denies pain     Hand Dominance Right   Extremity/Trunk Assessment Upper Extremity Assessment Upper Extremity Assessment: Generalized weakness  Lower Extremity Assessment Lower Extremity Assessment: Defer to PT evaluation   Cervical / Trunk Assessment Cervical / Trunk Assessment: Kyphotic   Communication Communication Communication: No difficulties   Cognition Arousal/Alertness: Awake/alert Behavior During Therapy: WFL for tasks  assessed/performed Overall Cognitive Status: Impaired/Different from baseline Area of Impairment: Memory;Safety/judgement;Problem solving                     Memory: Decreased short-term memory   Safety/Judgement: Decreased awareness of safety   Problem Solving: Slow processing;Requires verbal cues General Comments: A&O to person. Some difficulty recalling DOB but able to with increased time.     General Comments  Husband present at bedside.    Exercises     Shoulder Instructions      Home Living Family/patient expects to be discharged to:: Private residence Living Arrangements: Spouse/significant other Available Help at Discharge: Family Type of Home: House Home Access: [place]: One level     Bathroom Shower/Tub: Teacher, early years/pre: Standard Bathroom Accessibility: Yes   Home Equipment: Conservation officer, nature (2 wheels);Shower seat;Rollator (4 wheels);BSC/3in1;Wheelchair - manual   Additional Comments: Per pt, she lives with spouse who can assist her "a [place]".  Niece is also supportive but can only provide intermittent assist; It seems that family has been having difficulty meeting her needs over the past few hospital admissions, and they are requesting SNF      Prior Functioning/Environment Prior Level of Function : Needs assist       Physical Assist : Mobility (physical);ADLs (physical) Mobility (physical): Gait   Mobility Comments: Pt reports using a RW at home and all the time in the community but also reports WC is available ADLs Comments: Pt completes all ADL's with no difficulties and Niece assists with Cooking and cleaning.        OT Problem List: Decreased strength;Decreased activity tolerance;Impaired balance (sitting and/or standing);Decreased coordination;Decreased safety awareness      OT Treatment/Interventions: Self-care/ADL training;Therapeutic exercise;Energy conservation;DME and/or AE  instruction;Therapeutic activities;Patient/family education;Balance training    OT Goals(Current goals can be found in the care plan section) Acute Rehab OT Goals Patient Stated Goal: Patient in agreement with SNF rehab. OT Goal Formulation: With patient Time For Goal Achievement: 02/04/21 Potential to Achieve Goals: Good ADL Goals Pt Will Perform Grooming: with modified independence;standing Pt Will Perform Upper Body Dressing: with modified independence;sitting Pt Will Perform Lower Body Dressing: with modified independence;sit to/from stand Pt Will Transfer to Toilet: with modified independence;ambulating Pt Will Perform Toileting - Clothing Manipulation and hygiene: with modified independence;sit to/from stand  OT Frequency: Min 2X/week   Barriers to D/C:            Co-evaluation              AM-PAC OT "6 Clicks" Daily Activity     Outcome Measure Help from another person eating meals?: None Help from another person taking care of personal grooming?: None Help from another person toileting, which includes using toliet, bedpan, or urinal?: A [place] Help from another person bathing (including washing, rinsing, drying)?: A [place] Help from another person to put on and taking off regular upper body clothing?: A [place] Help from another person to put on and taking off regular lower body clothing?: A [place] 6 Click Score: 20   End of Session Equipment Utilized During Treatment: Rolling walker (2 wheels);Gait belt Nurse Communication: Mobility status  Activity Tolerance: Patient tolerated treatment well Patient left: in chair;with  call bell/phone within reach;with family/visitor present (Husband present at bedside.)  OT Visit Diagnosis: Unsteadiness on feet (R26.81);Other abnormalities of gait and mobility (R26.89);Muscle weakness (generalized) (M62.81)                Time: 3267-1245 OT Time Calculation (min): 18 min Charges:  OT General Charges $OT Visit: 1 Visit OT  Evaluation $OT Eval Low Complexity: 1 Low  Ronson Hagins H. OTR/L Supplemental OT, Department of rehab services [phone]  Forrester Blando R H. 01/21/2021, 2:42 PM

## 2021-01-21 NOTE — Progress Notes (Signed)
[name] 9:37 AM  Subjective: Patient doing well without any GI complaints and no signs of bleeding she has not had a bowel movement and she is about to eat regular breakfast  Objective: Vital signs stable afebrile no acute distress abdomen is soft nontender hemoglobin stable  Assessment: Seemingly resolved GI blood loss status post ulcer therapy  Plan: Hopefully she will be able to go home soon and we are happy to see back in the office as needed and follow-up otherwise continue pump inhibitors minimize aspirin and nonsteroidals and please call us if any specific question or problem with this hospital stay  [facility] E  office [phone] After 5PM or if no answer call [phone]

## 2021-01-21 NOTE — TOC Progression Note (Signed)
Transition of Care [facility]) - Progression Note    Patient Details  Name: [name] MRN: [id number] Date of Birth: Jun 13, 1940  Transition of Care [facility]) CM/SW Contact  Joanne Chars, LCSW Phone Number: 01/21/2021, 4:06 PM  Clinical Narrative:   CSW met with pt and husband [name] regarding recommendation for SNF.  Permission given to speak with husband present, also to talk with Niece [name].  They are agreeable to SNF, choice document given, permission given to send out referral in hub.  Per husband, [place] aide just started, was only in the home one day prior to pt being admitted.  Pt reports she is vaccinated for covid but cannot recall how many shots she has received.    Pt referral sent out in hub.  Auth request also submitted in [place] on this date.      Expected Discharge Plan: Long Term Nursing Home Barriers to Discharge: Family Issues  Expected Discharge Plan and Services Expected Discharge Plan: Long Term Nursing Home In-house Referral: Clinical Social Work Discharge Planning Services: CM Consult                                           Social Determinants of Health (SDOH) Interventions    Readmission Risk Interventions No flowsheet data found.

## 2021-01-22 ENCOUNTER — Ambulatory Visit (HOSPITAL_BASED_OUTPATIENT_CLINIC_OR_DEPARTMENT_OTHER): Payer: Medicare Other | Admitting: General Practice

## 2021-01-22 DIAGNOSIS — I780 Hereditary hemorrhagic telangiectasia: Secondary | ICD-10-CM

## 2021-01-22 LAB — BASIC METABOLIC PANEL
Anion gap: 5 (ref 5–15)
BUN: 12 mg/dL (ref 8–23)
CO2: 23 mmol/L (ref 22–32)
Calcium: 8.5 mg/dL — ABNORMAL LOW (ref 8.9–10.3)
Chloride: 109 mmol/L (ref 98–111)
Creatinine, Ser: 1.36 mg/dL — ABNORMAL HIGH (ref 0.44–1.00)
GFR, Estimated: 39 mL/min — ABNORMAL LOW (ref >60)
Glucose, Bld: 203 mg/dL — ABNORMAL HIGH (ref 70–99)
Potassium: 4.1 mmol/L (ref 3.5–5.1)
Sodium: 137 mmol/L (ref 135–145)

## 2021-01-22 LAB — RESP PANEL BY RT-PCR (FLU A&B, COVID) ARPGX2
Influenza A by PCR: NEGATIVE
Influenza B by PCR: NEGATIVE
SARS Coronavirus 2 by RT PCR: NEGATIVE

## 2021-01-22 LAB — CBC
HCT: 27.3 % — ABNORMAL LOW (ref 36.0–46.0)
Hemoglobin: 9.5 g/dL — ABNORMAL LOW (ref 12.0–15.0)
MCH: 31.7 pg (ref 26.0–34.0)
MCHC: 34.8 g/dL (ref 30.0–36.0)
MCV: 91 fL (ref 80.0–100.0)
Platelets: 119 10*3/uL — ABNORMAL LOW (ref 150–400)
RBC: 3 MIL/uL — ABNORMAL LOW (ref 3.87–5.11)
RDW: 14.4 % (ref 11.5–15.5)
WBC: 5 10*3/uL (ref 4.0–10.5)
nRBC: 0.4 % — ABNORMAL HIGH (ref 0.0–0.2)

## 2021-01-22 LAB — GLUCOSE, CAPILLARY
Glucose-Capillary: 147 mg/dL — ABNORMAL HIGH (ref 70–99)
Glucose-Capillary: 118 mg/dL — ABNORMAL HIGH (ref 70–99)
Glucose-Capillary: 164 mg/dL — ABNORMAL HIGH (ref 70–99)
Glucose-Capillary: 196 mg/dL — ABNORMAL HIGH (ref 70–99)
Glucose-Capillary: 208 mg/dL — ABNORMAL HIGH (ref 70–99)

## 2021-01-22 MED ORDER — POLYETHYLENE GLYCOL 3350 17 G PO PACK
17.0000 g | PACK | Freq: Every day | ORAL | Status: DC
  Administered 2021-01-22: 13:00:00 17 g via ORAL
  Filled 2021-01-22: qty 1

## 2021-01-22 MED ORDER — HEPARIN SOD (PORK) LOCK FLUSH 100 UNIT/ML IV SOLN
500.0000 [IU] | INTRAVENOUS | Status: AC | PRN
  Administered 2021-01-22: 16:00:00 500 [IU]
  Filled 2021-01-22: qty 5

## 2021-01-22 MED ORDER — COVID-19MRNA BIVAL VACC PFIZER 30 MCG/0.3ML IM SUSP
0.3000 mL | Freq: Once | INTRAMUSCULAR | Status: AC
  Administered 2021-01-22: 17:00:00 0.3 mL via INTRAMUSCULAR
  Filled 2021-01-22: qty 0.3

## 2021-01-22 NOTE — TOC Transition Note (Signed)
Transition of Care [facility]) - CM/SW Discharge Note   Patient Details  Name: [name] MRN: [id number] Date of Birth: [birth year]  Transition of Care [facility]) CM/SW Contact:  Joanne Chars, LCSW Phone Number: 01/22/2021, 3:18 PM   Clinical Narrative:   Pt discharging to [facility].  RN call [phone] for report.  PTAR called at 1515.     Final next level of care: Skilled Nursing Facility Barriers to Discharge: Barriers Resolved   Patient Goals and CMS Choice        Discharge Placement              Patient chooses bed at:  ([facility]) Patient to be transferred to facility by: [place]anna Name of family member notified: daughter Cassandria Santee Patient and family notified of of transfer: 01/22/21  Discharge Plan and Services In-house Referral: Clinical Social Work Discharge Planning Services: CM Consult                                 Social Determinants of Health (SDOH) Interventions     Readmission Risk Interventions No flowsheet data found.

## 2021-01-22 NOTE — TOC Progression Note (Addendum)
Transition of Care [facility]) - Progression Note    Patient Details  Name: [name] MRN: [id number] Date of Birth: 07/21/1940  Transition of Care [facility]) CM/SW Contact  Joanne Chars, LCSW Phone Number: 01/22/2021, 1:16 PM  Clinical Narrative:      1000: CSW spoke with pt and husband about bed offers, they asked CSW to check with [facility] and [facility].  1025: TC from [place] at [place].  They will approve the rehab request, just need facility choice.   1030: Pt daughter [name] asked to speak with CSW, permission given by pt to do so.  Tabitha in room, reports they do want to pursue a facility that would be able to offer long term care after the rehab is completed.    CSW spoke with [place] at [place], no long term beds.  Tr[facility] at [facility] reports no beds at all.   Teresa at [facility] does offer a bed, can do rehab and convert to long term care, has bed available today. CSW spoke with pt and daughter [name] who do accept this offer.  Helene Kelp informed.  CSW called [facility] and provided facility choice.    1500: Auth received in [place]: [id number], 12/14/-12/16, 3 days.      Expected Discharge Plan: Long Term Nursing Home Barriers to Discharge: Family Issues  Expected Discharge Plan and Services Expected Discharge Plan: Long Term Nursing Home In-house Referral: Clinical Social Work Discharge Planning Services: CM Consult                                           Social Determinants of Health (SDOH) Interventions    Readmission Risk Interventions No flowsheet data found.

## 2021-01-22 NOTE — Progress Notes (Signed)
Mobility Specialist Criteria Algorithm Info. ° ° 01/22/21 1045  °Mobility  °Activity Ambulated in hall;Transferred:  Bed to chair °(to chair after ambulation)  °Range of Motion/Exercises Active;All extremities  °Level of Assistance Standby; supervision  °Assistive Device Front wheel walker  °Distance Ambulated (ft) 240 ft  °Mobility Ambulated with assistance in hallway  °Mobility Response Tolerated well  °Mobility performed by Mobility specialist  °Bed Position Chair  ° °Patient received in bed eager to participate in mobility. Ambulated in hallway supervision level with steady gait. Returned to room without complaint or incident. Was left in recliner chair with all needs met and call bell in reach. ° ° °01/22/2021 °11:52 AM ° °

## 2021-01-22 NOTE — Progress Notes (Signed)
[name] 9:46 AM  Subjective: Patient without any GI complaints and no signs of GI bleeding and she believes she might be going to rehab soon and she does admit to using some ibuprofen at home we discussed using Tylenol only and she is not sure of her pantoprazole dose at home  Objective: Vital signs stable afebrile no acute distress abdomen is soft nontender hemoglobin stable  Assessment: Peptic ulcer disease  Plan: Please see above for recommendations Case discussed yesterday with hospital team please call me if I can be of any further assistance with this hospital stay and see yesterday note for other details  [facility] E  office [phone] After 5PM or if no answer call [phone]

## 2021-01-22 NOTE — Care Management Important Message (Signed)
Important Message  Patient Details  Name: [name] MRN: [id number] Date of Birth: 02-Aug-1940   Medicare Important Message Given:  Yes     [name] 01/22/2021, 2:34 PM

## 2021-01-27 ENCOUNTER — Other Ambulatory Visit: Payer: Self-pay | Admitting: Internal Medicine

## 2021-01-27 ENCOUNTER — Ambulatory Visit (INDEPENDENT_AMBULATORY_CARE_PROVIDER_SITE_OTHER): Payer: Medicare Other | Admitting: Endocrinology

## 2021-01-27 ENCOUNTER — Encounter: Payer: Self-pay | Admitting: Endocrinology

## 2021-01-27 ENCOUNTER — Other Ambulatory Visit: Payer: Self-pay

## 2021-01-27 VITALS — BP 140/80 | HR 72 | Ht 63.0 in | Wt 130.0 lb

## 2021-01-27 DIAGNOSIS — E1165 Type 2 diabetes mellitus with hyperglycemia: Secondary | ICD-10-CM | POA: Diagnosis not present

## 2021-01-27 DIAGNOSIS — Z794 Long term (current) use of insulin: Secondary | ICD-10-CM | POA: Diagnosis not present

## 2021-01-27 NOTE — Progress Notes (Signed)
Patient ID: [name], female   DOB: 14-Nov-1940, 80 y.o.   MRN: [id number]           Reason for Appointment: Follow-up for Type 2 Diabetes  Referring PCP: Koleen Distance   History of Present Illness:          Date of diagnosis of type 2 diabetes mellitus: 1988       Background history:   Patient has been on insulin for a few years and she is unclear when this was started, according to Epic she was started on Levemir in 2015 Prior to that likely had been on various oral hypoglycemic agents including metformin, glipizide and Tradjenta Her level of control has been somewhat variable but not consistently poor; her A1c has ranged between 5.6 and 7.6  Recent history:   Her A1c is last 7.8   INSULIN regimen is: Humalog mix 10 units before breakfast and Admelog 2  units before each meal  Non-insulin hypoglycemic drugs prescribed: Trulicity 1.5 mg weekly  Current management, blood sugar patterns and problems identified:  She is now in the nursing home for rehab and home blood sugars prior to her admission were not available Only in the last few days of blood sugars are available  At the nursing home blood sugars are mostly checked at breakfast and lunchtime and sporadically later in the day  Although she was taking only mealtime insulin at dinnertime appears that she is taking her mealtime insulin before each meal although this was not verified on her [facility] She appears to be eating more complete meals now  Blood sugars are mostly over 200 nonfasting with highest reading 324  However they are not fluctuating as high as before  FASTING blood sugars appear to be usually fairly consistently in the 120s  Blood sugars around 9-10 PM appear to be over 200 also No hypoglycemia She has been regular with Trulicity which has been continued She thinks her appetite is fairly good and she appears to be eating at least a complete breakfast meal         Side effects from medications have been: None  known     Meal times are:  Breakfast is at variable times between 8 AM-9  Dinner 7 pm             Exercise:  Unable to  Glucose monitoring: As below      glucometer: Accu-Chek    Blood Glucose readings with readings    PRE-MEAL Fasting Lunch Dinner Bedtime Overall  Glucose range: 120-186 177-270 324 218, 217   Mean/median:        POST-MEAL PC Breakfast PC Lunch PC Dinner  Glucose range:  242   Mean/median:      Previously:  PRE-MEAL Morning Lunch Dinner Bedtime Overall  Glucose range:     61-500+  Mean/median: 210 167 297 166 233   POST-MEAL PC Breakfast PC Lunch 8-10 PM  Glucose range:     Mean/median:   301     PRE-MEAL Fasting Lunch Dinner Bedtime Overall  Glucose range:     48-314  Mean/median: 99   141 127+/- 54     Dietician visit, most recent: 11/2018  Weight history:  Wt Readings from Last 3 Encounters:  01/27/21 130 lb (59 kg)  01/20/21 138 lb 14.2 oz (63 kg)  01/17/21 141 lb (64 kg)    Glycemic control:   Lab Results  Component Value Date   HGBA1C 7.8 (A) 11/28/2020   HGBA1C  6.7 (A) 08/28/2020   HGBA1C 7.1 (A) 04/25/2020   Lab Results  Component Value Date   MICROALBUR 1,289.4 (H) 03/07/2019   LDLCALC 57 01/30/2019   CREATININE 1.36 (H) 01/22/2021   Lab Results  Component Value Date   MICRALBCREAT 5 03/28/2018    Lab Results  Component Value Date   FRUCTOSAMINE 404 (H) 10/31/2019   FRUCTOSAMINE 256 06/26/2019    No results displayed because visit has over 200 results.      Allergies as of 01/27/2021       Reactions   Aspirin Nausea And Vomiting        Medication List        Accurate as of January 27, 2021  3:47 PM. If you have any questions, ask your nurse or doctor.          Accu-Chek Guide test strip Generic drug: glucose blood 1 each by Other route in the morning, at noon, and at bedtime. What changed: Another medication with the same name was changed. Make sure you understand how and when to take each.    Accu-Chek Guide test strip Generic drug: glucose blood USE AS INSTRUCTED TO CHECK BLOOD SUGAR 3 TIMES DAILY. What changed: See the new instructions.   Accu-Chek Softclix Lancets lancets 1 each by Other route 3 (three) times daily. Use as instructed to check blood sugar 3 times per day dx code E11.65   Lancets Misc 1 each by Other route daily.   amLODipine 2.5 MG tablet Commonly known as: NORVASC Take 1 tablet (2.5 mg total) by mouth every morning.   B-D UF III MINI PEN NEEDLES 31G X 5 MM Misc Generic drug: Insulin Pen Needle USE TO INJECT INSULIN THREE TIMES A DAY What changed:  how much to take how to take this when to take this   diclofenac Sodium 1 % Gel Commonly known as: VOLTAREN Apply 2 g topically 4 (four) times daily. What changed:  when to take this reasons to take this   FreeStyle Libre 2 Sensor Misc 2 Devices by Does not apply route every 14 (fourteen) days.   gabapentin 100 MG capsule Commonly known as: NEURONTIN TAKE 1 CAPSULE BY MOUTH TWICE DAILY   HumaLOG Mix 75/25 KwikPen (75-25) 100 UNIT/ML Kwikpen Generic drug: Insulin Lispro Prot & Lispro INJECT 10 UNITS SUBCUTANEOUSLY ONCE DAILY What changed: See the new instructions.   IMMUNE FORMULA PO Take 1 tablet by mouth daily.   insulin lispro 100 UNIT/ML KwikPen Commonly known as: HumaLOG KwikPen 3 units before dinner What changed:  how much to take how to take this when to take this additional instructions   levETIRAcetam 500 MG tablet Commonly known as: KEPPRA TAKE 1 TABLET BY MOUTH TWICE DAILY   Medical Compression Stockings Misc Wear as much as possible while awake to reduce swelling   memantine 10 MG tablet Commonly known as: NAMENDA TAKE 1 TABLET BY MOUTH TWICE DAILY   Multi For Her 50+ Tabs Take 1 tablet by mouth daily with breakfast.   pantoprazole 40 MG tablet Commonly known as: PROTONIX Take 1 tablet (40 mg total) by mouth 2 (two) times daily.   potassium chloride SA 20  MEQ tablet Commonly known as: KLOR-CON M Take 20 mEq by mouth 2 (two) times daily.   torsemide 20 MG tablet Commonly known as: DEMADEX Take 20 mg by mouth daily.   triamcinolone ointment 0.5 % Commonly known as: KENALOG APPLY 1 APPLICATION TOPICALLY TO RASH TWICE DAILY FOR ITCHING   Trulicity  1.5 MG/0.5ML Sopn Generic drug: Dulaglutide INJECT 1.5MG  SUBCUTANEOUSLY EVERY WEEK ON THURSDAY What changed: See the new instructions.        Allergies:  Allergies  Allergen Reactions   Aspirin Nausea And Vomiting    Past Medical History:  Diagnosis Date   Chronic anemia    Chronic diastolic CHF (congestive heart failure) ([place]) 10/03/2013   Chronic GI bleeding    /notes 11/29/2014   Family history of anesthesia complication    "niece has a hard time coming out" (09/15/2012)   Frequent nosebleeds    chronic   Gastric AV malformation    /notes 11/29/2014   GERD (gastroesophageal reflux disease)    Heart murmur 04/01/2017   Moderate AVSC on echo 09/2016   History of blood transfusion "several"   History of epistaxis    HTN (hypertension), benign 03/02/2012   Hyperlipidemia    Iron deficiency anemia    chronic infusions"   Lichen planus    Both lower extremities   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    Past Surgical History:  Procedure Laterality Date   CATARACT EXTRACTION     "I think it was just one eye"   ESOPHAGOGASTRODUODENOSCOPY  02/26/2011   Procedure: ESOPHAGOGASTRODUODENOSCOPY (EGD);  Surgeon: [name], MD;  Location: [name];  Service: Endoscopy;  Laterality: N/A;   ESOPHAGOGASTRODUODENOSCOPY N/A 11/08/2012   Procedure: ESOPHAGOGASTRODUODENOSCOPY (EGD);   Surgeon: [name], MD;  Location: [name];  Service: Endoscopy;  Laterality: N/A;   ESOPHAGOGASTRODUODENOSCOPY N/A 10/04/2013   Procedure: ESOPHAGOGASTRODUODENOSCOPY (EGD);  Surgeon: Winfield Cunas., MD;  Location: [name];  Service: Endoscopy;  Laterality: N/A;  with APC on stand-by   ESOPHAGOGASTRODUODENOSCOPY N/A 07/06/2014   Procedure: ESOPHAGOGASTRODUODENOSCOPY (EGD);  Surgeon: Clarene Essex, MD;  Location: [name];  Service: Endoscopy;  Laterality: N/A;   ESOPHAGOGASTRODUODENOSCOPY N/A 09/05/2014   Procedure: ESOPHAGOGASTRODUODENOSCOPY (EGD);  Surgeon: Laurence Spates, MD;  Location: [name];  Service: Endoscopy;  Laterality: N/A;  APC on standby to control bleeding   ESOPHAGOGASTRODUODENOSCOPY N/A 11/29/2014   Procedure: ESOPHAGOGASTRODUODENOSCOPY (EGD);  Surgeon: [name], MD;  Location: [facility] ENDOSCOPY;  Service: Endoscopy;  Laterality: N/A;   ESOPHAGOGASTRODUODENOSCOPY N/A 09/28/2015   Procedure: ESOPHAGOGASTRODUODENOSCOPY (EGD);  Surgeon: Clarene Essex, MD;  Location[facility] ENDOSCOPY;  Service: Endoscopy;  Laterality: N/A;   ESOPHAGOGASTRODUODENOSCOPY (EGD) WITH PROPOFOL N/A 12/04/2017   Procedure: ESOPHAGOGASTRODUODENOSCOPY (EGD) WITH PROPOFOL;  Surgeon: [name], MD;  Location: [place];  Service: Endoscopy;  Laterality: N/A;   ESOPHAGOGASTRODUODENOSCOPY (EGD) WITH PROPOFOL N/A 12/24/2020   Procedure: ESOPHAGOGASTRODUODENOSCOPY (EGD) WITH PROPOFOL;  Surgeon: Arta Silence, MD;  Location: [place];  Service: Endoscopy;  Laterality: N/A;   ESOPHAGOGASTRODUODENOSCOPY (EGD) WITH PROPOFOL N/A 01/19/2021   Procedure: ESOPHAGOGASTRODUODENOSCOPY (EGD) WITH PROPOFOL;  Surgeon: [name], MD;  Location: [place];  Service: Endoscopy;  Laterality: N/A;   ESOPHAGOGASTRODUODENOSCOPY ENDOSCOPY  08/19/2006   with laser treatment   HEMOSTASIS CLIP PLACEMENT  12/24/2020   Procedure: HEMOSTASIS CLIP PLACEMENT;  Surgeon: Arta Silence, MD;  Location:  [facility] ENDOSCOPY;  Service: Endoscopy;;   HEMOSTASIS CLIP PLACEMENT  01/19/2021   Procedure: HEMOSTASIS CLIP PLACEMENT;  Surgeon: [name],  MD;  Location: [place];  Service: Endoscopy;;   HOT HEMOSTASIS N/A 07/06/2014   Procedure: HOT HEMOSTASIS (ARGON PLASMA COAGULATION/BICAP);  Surgeon: Clarene Essex, MD;  Location: [name];  Service: Endoscopy;  Laterality: N/A;   HOT HEMOSTASIS N/A 09/28/2015   Procedure: HOT HEMOSTASIS (ARGON PLASMA COAGULATION/BICAP);  Surgeon: Clarene Essex, MD;  Location: [facility] ENDOSCOPY;  Service: Endoscopy;  Laterality: N/A;   HOT HEMOSTASIS N/A 12/04/2017   Procedure: HOT HEMOSTASIS (ARGON PLASMA COAGULATION/BICAP);  Surgeon: [name], MD;  Location: [place];  Service: Endoscopy;  Laterality: N/A;   NASAL HEMORRHAGE CONTROL     "for bleeding"    SAVORY DILATION  02/26/2011   Procedure: SAVORY DILATION;  Surgeon: [name], MD;  Location: [name];  Service: Endoscopy;  Laterality: N/A;  c-arm needed   SCLEROTHERAPY  12/24/2020   Procedure: SCLEROTHERAPY;  Surgeon: Arta Silence, MD;  Location[facility];  Service: Endoscopy;;   SCLEROTHERAPY  01/19/2021   Procedure: Clide Deutscher;  Surgeon: [name], MD;  Location[facility] ENDOSCOPY;  Service: Endoscopy;;   SUBMUCOSAL INJECTION  12/04/2017   Procedure: SUBMUCOSAL INJECTION;  Surgeon: [name], MD;  Location: [facility] ENDOSCOPY;  Service: Endoscopy;;    Family History  Problem Relation Age of Onset   Healthy Mother    Stroke Father    Diabetes Niece    Breast cancer Other    Malignant hyperthermia Neg Hx    Seizures Neg Hx     Social History:  reports that she quit smoking about 49 years ago. Her smoking use included cigarettes. She has a 20.00 pack-year smoking history. She has never used smokeless tobacco. She reports that she does not drink alcohol and does not use drugs.   Review of Systems   Lipid history: Not on a statin drug    Lab Results  Component Value  Date   CHOL 133 01/30/2019   HDL 41.30 01/30/2019   LDLCALC 57 01/30/2019   TRIG 176.0 (H) 01/30/2019   CHOLHDL 3 01/30/2019           Hypertension: Treated with amlodipine and olmesartan  BP Readings from Last 3 Encounters:  01/27/21 140/80  01/22/21 (!) 133/46  01/17/21 137/64   Renal function history: Has CKD of unclear etiology  She is on Demadex prescribed by PCP   Lab Results  Component Value Date   CREATININE 1.36 (H) 01/22/2021   CREATININE 1.44 (H) 01/21/2021   CREATININE 1.41 (H) 01/20/2021   Lab Results  Component Value Date   K 4.1 01/22/2021     Most recent eye exam was in 07/2018  Most recent foot exam: 06/2018 by podiatrist with no evidence of sensory loss or reduction in sensation   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, microalbuminuria     Physical Examination:  BP 140/80    Pulse 72    Ht 5\' 3"  (1.6 m)    Wt 130 lb (59 kg)    LMP  (LMP Unknown)    SpO2 99%    BMI 23.03 kg/m    ASSESSMENT:  Diabetes type 2 on insulin  See history of present illness for detailed discussion of current diabetes management, blood sugar patterns and problems identified  A1c is last at 7.8  She is currently on Trulicity 1.5 mg, Admelog 2 units before meals and Humalog mix 10 units in the morning  Blood sugars in the few days are mostly higher nonfasting but are fairly good fasting She is taking premixed insulin only in the morning and  no long-acting insulin in the evening Diet appears to be more controlled and supervised in the nursing home as well as insulin doses  Hypoglycemia avoided in the last few days   PLAN:    Instructions were given in detail to the nursing home for adjustment of insulin and glucose monitoring as follows:  Check blood sugar with CBGs before each meal Check blood sugar at 9 PM every other day  HUMALOG mix insulin to be increased from 10 units to 12 units before breakfast  Mealtime insulin Admelog to be increased from 3  units before each meal to 3 units before each meal Give additional 1 unit Admelog at lunch and dinnertime if blood sugar is over 200   Patient Instructions  INSTRUCTIONS for nursing home  Check blood sugar with CBGs before each meal Check blood sugar at 9 PM every other day  HUMALOG mix insulin to be increased from 10 units to 12 units before breakfast  Mealtime insulin Admelog to be increased from 3 units before each meal to 3 units before each meal Give additional 1 unit Admelog at lunch and dinnertime if blood sugar is over 200   Elayne Snare 01/27/2021, 3:47 PM   Note: This office note was prepared with Dragon voice recognition system technology. Any transcriptional errors that result from this process are unintentional.

## 2021-01-27 NOTE — Patient Instructions (Addendum)
INSTRUCTIONS for nursing home  Check blood sugar with CBGs before each meal Check blood sugar at 9 PM every other day  HUMALOG mix insulin to be increased from 10 units to 12 units before breakfast  Mealtime insulin Admelog to be increased from 3 units before each meal to 3 units before each meal Give additional 1 unit Admelog at lunch and dinnertime if blood sugar is over 200

## 2021-01-28 ENCOUNTER — Telehealth: Payer: Self-pay | Admitting: *Deleted

## 2021-01-28 ENCOUNTER — Emergency Department (HOSPITAL_COMMUNITY): Payer: Medicare Other

## 2021-01-28 ENCOUNTER — Other Ambulatory Visit: Payer: Self-pay | Admitting: *Deleted

## 2021-01-28 ENCOUNTER — Ambulatory Visit: Payer: Medicare Other

## 2021-01-28 ENCOUNTER — Other Ambulatory Visit: Payer: Self-pay

## 2021-01-28 ENCOUNTER — Encounter: Payer: Medicare Other | Admitting: Internal Medicine

## 2021-01-28 ENCOUNTER — Observation Stay (HOSPITAL_COMMUNITY)
Admission: EM | Admit: 2021-01-28 | Discharge: 2021-01-30 | Disposition: A | Discharge status: Skilled Nursing Facility | Payer: Medicare Other | Attending: Internal Medicine | Admitting: Internal Medicine

## 2021-01-28 ENCOUNTER — Encounter: Payer: Self-pay | Admitting: *Deleted

## 2021-01-28 DIAGNOSIS — G9341 Metabolic encephalopathy: Secondary | ICD-10-CM | POA: Diagnosis not present

## 2021-01-28 DIAGNOSIS — Z87891 Personal history of nicotine dependence: Secondary | ICD-10-CM | POA: Insufficient documentation

## 2021-01-28 DIAGNOSIS — Z7985 Long-term (current) use of injectable non-insulin antidiabetic drugs: Secondary | ICD-10-CM | POA: Insufficient documentation

## 2021-01-28 DIAGNOSIS — Z7984 Long term (current) use of oral hypoglycemic drugs: Secondary | ICD-10-CM | POA: Insufficient documentation

## 2021-01-28 DIAGNOSIS — I130 Hypertensive heart and chronic kidney disease with heart failure and stage 1 through stage 4 chronic kidney disease, or unspecified chronic kidney disease: Secondary | ICD-10-CM | POA: Diagnosis not present

## 2021-01-28 DIAGNOSIS — N1831 Chronic kidney disease, stage 3a: Secondary | ICD-10-CM | POA: Insufficient documentation

## 2021-01-28 DIAGNOSIS — E870 Hyperosmolality and hypernatremia: Secondary | ICD-10-CM | POA: Insufficient documentation

## 2021-01-28 DIAGNOSIS — I5032 Chronic diastolic (congestive) heart failure: Secondary | ICD-10-CM | POA: Insufficient documentation

## 2021-01-28 DIAGNOSIS — I480 Paroxysmal atrial fibrillation: Secondary | ICD-10-CM | POA: Insufficient documentation

## 2021-01-28 DIAGNOSIS — Z794 Long term (current) use of insulin: Secondary | ICD-10-CM | POA: Diagnosis not present

## 2021-01-28 DIAGNOSIS — R2689 Other abnormalities of gait and mobility: Secondary | ICD-10-CM | POA: Diagnosis not present

## 2021-01-28 DIAGNOSIS — F0390 Unspecified dementia without behavioral disturbance: Secondary | ICD-10-CM | POA: Diagnosis not present

## 2021-01-28 DIAGNOSIS — R4182 Altered mental status, unspecified: Principal | ICD-10-CM | POA: Insufficient documentation

## 2021-01-28 DIAGNOSIS — E1122 Type 2 diabetes mellitus with diabetic chronic kidney disease: Secondary | ICD-10-CM | POA: Diagnosis not present

## 2021-01-28 DIAGNOSIS — N179 Acute kidney failure, unspecified: Secondary | ICD-10-CM | POA: Diagnosis present

## 2021-01-28 DIAGNOSIS — R55 Syncope and collapse: Secondary | ICD-10-CM

## 2021-01-28 DIAGNOSIS — E860 Dehydration: Secondary | ICD-10-CM | POA: Diagnosis not present

## 2021-01-28 DIAGNOSIS — Z20822 Contact with and (suspected) exposure to covid-19: Secondary | ICD-10-CM | POA: Diagnosis not present

## 2021-01-28 DIAGNOSIS — Z79899 Other long term (current) drug therapy: Secondary | ICD-10-CM | POA: Diagnosis not present

## 2021-01-28 LAB — CBC WITH DIFFERENTIAL/PLATELET
Abs Immature Granulocytes: 0.04 10*3/uL (ref 0.00–0.07)
Basophils Absolute: 0 10*3/uL (ref 0.0–0.1)
Basophils Relative: 0 %
Eosinophils Absolute: 0.3 10*3/uL (ref 0.0–0.5)
Eosinophils Relative: 4 %
HCT: 35 % — ABNORMAL LOW (ref 36.0–46.0)
Hemoglobin: 11.5 g/dL — ABNORMAL LOW (ref 12.0–15.0)
Immature Granulocytes: 1 %
Lymphocytes Relative: 14 %
Lymphs Abs: 1 10*3/uL (ref 0.7–4.0)
MCH: 31.3 pg (ref 26.0–34.0)
MCHC: 32.9 g/dL (ref 30.0–36.0)
MCV: 95.4 fL (ref 80.0–100.0)
Monocytes Absolute: 0.7 10*3/uL (ref 0.1–1.0)
Monocytes Relative: 9 %
Neutro Abs: 5.1 10*3/uL (ref 1.7–7.7)
Neutrophils Relative %: 72 %
Platelets: 142 10*3/uL — ABNORMAL LOW (ref 150–400)
RBC: 3.67 MIL/uL — ABNORMAL LOW (ref 3.87–5.11)
RDW: 15.3 % (ref 11.5–15.5)
WBC: 7.1 10*3/uL (ref 4.0–10.5)
nRBC: 0 % (ref 0.0–0.2)

## 2021-01-28 LAB — URINALYSIS, ROUTINE W REFLEX MICROSCOPIC
Bilirubin Urine: NEGATIVE
Glucose, UA: NEGATIVE mg/dL
Hgb urine dipstick: NEGATIVE
Ketones, ur: NEGATIVE mg/dL
Leukocytes,Ua: NEGATIVE
Nitrite: NEGATIVE
Protein, ur: NEGATIVE mg/dL
Specific Gravity, Urine: 1.011 (ref 1.005–1.030)
pH: 5 (ref 5.0–8.0)

## 2021-01-28 LAB — COMPREHENSIVE METABOLIC PANEL
ALT: 16 U/L (ref 0–44)
AST: 22 U/L (ref 15–41)
Albumin: 3.1 g/dL — ABNORMAL LOW (ref 3.5–5.0)
Alkaline Phosphatase: 107 U/L (ref 38–126)
Anion gap: 11 (ref 5–15)
BUN: 40 mg/dL — ABNORMAL HIGH (ref 8–23)
CO2: 24 mmol/L (ref 22–32)
Calcium: 9.6 mg/dL (ref 8.9–10.3)
Chloride: 111 mmol/L (ref 98–111)
Creatinine, Ser: 2.25 mg/dL — ABNORMAL HIGH (ref 0.44–1.00)
GFR, Estimated: 22 mL/min — ABNORMAL LOW (ref >60)
Glucose, Bld: 180 mg/dL — ABNORMAL HIGH (ref 70–99)
Potassium: 3.9 mmol/L (ref 3.5–5.1)
Sodium: 146 mmol/L — ABNORMAL HIGH (ref 135–145)
Total Bilirubin: 0.9 mg/dL (ref 0.3–1.2)
Total Protein: 6.3 g/dL — ABNORMAL LOW (ref 6.5–8.1)

## 2021-01-28 LAB — RESP PANEL BY RT-PCR (FLU A&B, COVID) ARPGX2
Influenza A by PCR: NEGATIVE
Influenza B by PCR: NEGATIVE
SARS Coronavirus 2 by RT PCR: NEGATIVE

## 2021-01-28 LAB — CBG MONITORING, ED: Glucose-Capillary: 158 mg/dL — ABNORMAL HIGH (ref 70–99)

## 2021-01-28 LAB — LACTIC ACID, PLASMA: Lactic Acid, Venous: 2.6 mmol/L (ref 0.5–1.9)

## 2021-01-28 MED ORDER — SODIUM CHLORIDE 0.9 % IV BOLUS
1000.0000 mL | Freq: Once | INTRAVENOUS | Status: AC
  Administered 2021-01-28: 22:00:00 1000 mL via INTRAVENOUS

## 2021-01-28 MED ORDER — ACETAMINOPHEN 650 MG RE SUPP: 650.0000 mg | Freq: Four times a day (QID) | RECTAL | Status: DC | PRN

## 2021-01-28 MED ORDER — ENOXAPARIN SODIUM 30 MG/0.3ML IJ SOSY
30.0000 mg | PREFILLED_SYRINGE | INTRAMUSCULAR | Status: DC
  Administered 2021-01-29 – 2021-01-30 (×2): 30 mg via SUBCUTANEOUS
  Filled 2021-01-28 (×2): qty 0.3

## 2021-01-28 MED ORDER — LEVETIRACETAM 500 MG PO TABS: 500.0000 mg | ORAL_TABLET | Freq: Two times a day (BID) | ORAL | Status: DC

## 2021-01-28 MED ORDER — MEMANTINE HCL 10 MG PO TABS
10.0000 mg | ORAL_TABLET | Freq: Two times a day (BID) | ORAL | Status: DC
  Administered 2021-01-29 – 2021-01-30 (×5): 10 mg via ORAL
  Filled 2021-01-28 (×6): qty 1

## 2021-01-28 MED ORDER — ACETAMINOPHEN 325 MG PO TABS: 650.0000 mg | ORAL_TABLET | Freq: Four times a day (QID) | ORAL | Status: DC | PRN

## 2021-01-28 MED ORDER — INSULIN ASPART 100 UNIT/ML IJ SOLN
0.0000 [IU] | Freq: Three times a day (TID) | INTRAMUSCULAR | Status: DC
  Administered 2021-01-29 (×2): 3 [IU] via SUBCUTANEOUS
  Administered 2021-01-30 (×2): 5 [IU] via SUBCUTANEOUS
  Administered 2021-01-30: 2 [IU] via SUBCUTANEOUS

## 2021-01-28 MED ORDER — AMLODIPINE BESYLATE 5 MG PO TABS
2.5000 mg | ORAL_TABLET | Freq: Every morning | ORAL | Status: DC
  Administered 2021-01-29 – 2021-01-30 (×2): 2.5 mg via ORAL
  Filled 2021-01-28 (×2): qty 1

## 2021-01-28 MED ORDER — LEVETIRACETAM 500 MG PO TABS
500.0000 mg | ORAL_TABLET | Freq: Two times a day (BID) | ORAL | Status: DC
  Administered 2021-01-29 – 2021-01-30 (×4): 500 mg via ORAL
  Filled 2021-01-28 (×4): qty 1

## 2021-01-28 NOTE — ED Notes (Signed)
MD notified of lactic

## 2021-01-28 NOTE — Telephone Encounter (Signed)
Letter with instructions mailed to [facility].

## 2021-01-28 NOTE — Progress Notes (Unsigned)
Enrolled for [employer] to mail a ZIO XT long term holter monitor to [employer], [address], [place], [place], [place]  [address] Attn: DON. [phone]

## 2021-01-28 NOTE — H&P (Addendum)
Date: 01/28/2021               Patient Name:  [name] MRN: [id number]  DOB: [birth year] Age / Sex: 80 y.o., female   PCP: Mike Craze, DO         Medical Service: Internal Medicine Teaching Service         Attending Physician: Dr. Isla Pence, MD    First Contact: Wayland Denis, MD Pager: EZ [phone]  Second Contact: Mitzi Hansen, MD Pager: Cassell Smiles [phone]       After Hours (After 5p/  First Contact Pager: [phone]  weekends / holidays): Second Contact Pager: [phone]   SUBJECTIVE  Chief Complaint: altered mental status  History of Present Illness: [name] is a 80 y.o. female with a pertinent PMH of hereditary hemorrhagic telangiectasias, recent hospitalization with severe GI bleeding from a gastric ulcer, recent seizures, Insulin dependent type 2 DM , CKD stage 3a, HTN, HLD and dementia, who presents to [facility] with altered mental status.  Her daughter states that following discharge, patient was doing well at SNF.  She was called by her father this morning because patient was not eating and could not identify family members.    Patient was brought to ED via EMS from SNF.  Blood pressure 140/68 with heart rate at 77 on arrival, saturating 100% on room air.  Lab evaluation demonstrated elevated creatinine at 2.25 with lactate of 2.6.  Urinalysis negative.  CT head with no acute intracranial abnormality. Patient also had some decreased movement in left arm and MRI is pending.  Once daughter arrived she stated this is chronic issue. 1 L NS given in ED.  Patient has had multiple admissions over the past months:  11/14 - 12/26/2020: Admitted to the ICU with hemorrhagic shock due to esophagitis and gastric ulcer which was subsequently clipped. 11/22 - 01/01/2021: Admitted for encephalopathy and AKI 12/1 - 01/15/2021: Admitted for encephalopathy.  Thought to be due to dehydration 12/10-12/14/2022: Admitted due to hematemesis secondary to upper GI bleed s/p  clipping  Medications: Current Facility-Administered Medications on File Prior to Encounter  Medication Dose Route Frequency Provider Last Rate Last Admin   sodium chloride 0.9 % injection 10 mL  10 mL Intracatheter PRN Truitt Merle, MD       sodium chloride flush (NS) 0.9 % injection 10 mL  10 mL Intracatheter PRN Truitt Merle, MD   10 mL at 08/18/19 1717   Current Outpatient Medications on File Prior to Encounter  Medication Sig Dispense Refill   ACCU-CHEK GUIDE test strip USE AS INSTRUCTED TO CHECK BLOOD SUGAR 3 TIMES DAILY. (Patient taking differently: 1 each by Other route in the morning, at noon, and at bedtime.) 100 each 0   Accu-Chek Softclix Lancets lancets 1 each by Other route 3 (three) times daily. Use as instructed to check blood sugar 3 times per day dx code E11.65 100 each 3   amLODipine (NORVASC) 2.5 MG tablet Take 1 tablet (2.5 mg total) by mouth every morning. 30 tablet 1   Continuous Blood Gluc Sensor (FREESTYLE LIBRE 2 SENSOR) MISC 2 Devices by Does not apply route every 14 (fourteen) days. 2 each 3   diclofenac Sodium (VOLTAREN) 1 % GEL Apply 2 g topically 4 (four) times daily. (Patient taking differently: Apply 2 g topically 4 (four) times daily as needed (pain).) 100 g 1   Elastic Bandages & Supports (MEDICAL COMPRESSION STOCKINGS) MISC Wear as much as possible while awake to reduce swelling  2 each 0   gabapentin (NEURONTIN) 100 MG capsule TAKE 1 CAPSULE BY MOUTH TWICE DAILY (Patient taking differently: Take 100 mg by mouth 2 (two) times daily.) 60 capsule 10   glucose blood (ACCU-CHEK GUIDE) test strip 1 each by Other route in the morning, at noon, and at bedtime.     HUMALOG MIX 75/25 KWIKPEN (75-25) 100 UNIT/ML Kwikpen INJECT 10 UNITS SUBCUTANEOUSLY ONCE DAILY (Patient taking differently: Inject 10 Units into the skin daily.) 9 mL 10   insulin lispro (HUMALOG KWIKPEN) 100 UNIT/ML KwikPen 3 units before dinner (Patient taking differently: Inject 2 Units into the skin 3 (three)  times daily before meals.) 15 mL 1   Insulin Pen Needle (B-D UF III MINI PEN NEEDLES) 31G X 5 MM MISC USE TO INJECT INSULIN THREE TIMES A DAY (Patient taking differently: 1 each by Other route See admin instructions. USE TO INJECT INSULIN THREE TIMES A DAY) 100 each 0   Lancets MISC 1 each by Other route daily.     levETIRAcetam (KEPPRA) 500 MG tablet TAKE 1 TABLET BY MOUTH TWICE DAILY (Patient taking differently: Take 500 mg by mouth 2 (two) times daily.) 60 tablet 10   memantine (NAMENDA) 10 MG tablet TAKE 1 TABLET BY MOUTH TWICE DAILY (Patient taking differently: Take 10 mg by mouth 2 (two) times daily.) 60 tablet 10   Misc Natural Products (IMMUNE FORMULA PO) Take 1 tablet by mouth daily.     Multiple Vitamins-Minerals (MULTI FOR HER 50+) TABS Take 1 tablet by mouth daily with breakfast.     pantoprazole (PROTONIX) 40 MG tablet Take 1 tablet (40 mg total) by mouth 2 (two) times daily. 60 tablet 2   potassium chloride SA (KLOR-CON M) 20 MEQ tablet Take 20 mEq by mouth 2 (two) times daily.     torsemide (DEMADEX) 20 MG tablet TAKE 1 TABLET BY MOUTH EVERY MORNING 30 tablet 5   triamcinolone ointment (KENALOG) 0.5 % APPLY 1 APPLICATION TOPICALLY TO RASH TWICE DAILY FOR ITCHING 30 g 10   TRULICITY 1.5 XI/3.3AS SOPN INJECT 1.5MG  SUBCUTANEOUSLY EVERY WEEK ON THURSDAY (Patient taking differently: Inject 1.5 mg into the skin every Thursday.) 4 mL 10    Past Medical History:  Past Medical History:  Diagnosis Date   Chronic anemia    Chronic diastolic CHF (congestive heart failure) ([place]) 10/03/2013   Chronic GI bleeding    /notes 11/29/2014   Family history of anesthesia complication    "niece has a hard time coming out" (09/15/2012)   Frequent nosebleeds    chronic   Gastric AV malformation    /notes 11/29/2014   GERD (gastroesophageal reflux disease)    Heart murmur 04/01/2017   Moderate AVSC on echo 09/2016   History of blood transfusion "several"   History of epistaxis    HTN (hypertension),  benign 03/02/2012   Hyperlipidemia    Iron deficiency anemia    chronic infusions"   Lichen planus    Both lower extremities   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    Social:  Support - Husband, daughter, and granddaughter visit her daily at SNF Level of function -  able to ambulate, difficulty with ADLs currently lives at [facility] following several hospitalizations recently PCP - [facility] Substance use - quit smoking about 49 years ago, but smoked for about 20 years. Does not drink alcohol or use drugs.  Family History: Family History  Problem Relation Age of Onset   Healthy Mother    Stroke Father    Diabetes Niece    Breast cancer Other    Malignant hyperthermia Neg Hx    Seizures Neg Hx     Allergies: Allergies as of 01/28/2021 - Review Complete 01/27/2021  Allergen Reaction Noted   Aspirin Nausea And Vomiting 05/12/2006    Review of Systems: A complete ROS was negative except as per HPI.   OBJECTIVE:  Physical Exam: Blood pressure (!) 144/65, pulse 78, temperature (!) 97.2 F (36.2 C), temperature source Axillary, resp. rate 16, SpO2 99 %. Gen: elderly female laying in bed with daughter and granddaughter at bedside, in no acute distress HENT: NCAT Eyes: no scleral icterus, conjunctiva clear Heart:RRR, systolic murmur grade 3 Lungs: CTAB, pulmonary effort normal Abd: soft, nondistended, nontender MSK: no peripheral edema bilaterally Skin: warm and dry Psych: alert and oriented to self only, able to identify daughter  Pertinent Labs: CBC    Component Value Date/Time   WBC 7.1 01/28/2021 1943   RBC 3.67 (L) 01/28/2021 1943   HGB 11.5 (L) 01/28/2021  1943   HGB 12.1 01/17/2021 1450   HGB 9.7 (L) 03/13/2015 1059   HCT 35.0 (L) 01/28/2021 1943   HCT 29.5 (L) 03/13/2015 1059   PLT 142 (L) 01/28/2021 1943   PLT 142 (L) 01/17/2021 1450   PLT 140 (L) 03/13/2015 1059   PLT 237 11/09/2014 1547   MCV 95.4 01/28/2021 1943   MCV 85.5 03/13/2015 1059   MCH 31.3 01/28/2021 1943   MCHC 32.9 01/28/2021 1943   RDW 15.3 01/28/2021 1943   RDW 21.7 (H) 03/13/2015 1059   LYMPHSABS 1.0 01/28/2021 1943   LYMPHSABS 0.4 (L) 03/13/2015 1059   MONOABS 0.7 01/28/2021 1943   MONOABS 0.3 03/13/2015 1059   EOSABS 0.3 01/28/2021 1943   EOSABS 0.3 03/13/2015 1059   BASOSABS 0.0 01/28/2021 1943   BASOSABS 0.0 03/13/2015 1059     CMP     Component Value Date/Time   NA 146 (H) 01/28/2021 1943   NA 145 (H) 08/30/2019 1449   NA 144 04/04/2014 1019   K 3.9 01/28/2021 1943   K 4.5 04/04/2014 1019   CL 111 01/28/2021 1943   CL 111 (H) 12/28/2011 0825   CO2 24 01/28/2021 1943   CO2 26 04/04/2014 1019   GLUCOSE 180 (H) 01/28/2021 1943   GLUCOSE 156 (H) 04/04/2014 1019   GLUCOSE 152 (H) 12/28/2011 0825   BUN 40 (H) 01/28/2021 1943   BUN 11 08/30/2019 1449   BUN 16.9 04/04/2014 1019   CREATININE 2.25 (H) 01/28/2021 1943   CREATININE 1.38 (H) 01/17/2021 1450   CREATININE 0.92 01/28/2015 0811   CREATININE 0.9 04/04/2014 1019   CALCIUM 9.6 01/28/2021 1943   CALCIUM 9.3 04/04/2014 1019   PROT 6.3 (L) 01/28/2021 1943   PROT 5.6 (L) 08/30/2019 1449   PROT 6.4 04/04/2014 1019   ALBUMIN 3.1 (L) 01/28/2021 1943   ALBUMIN 3.0 (L) 08/30/2019 1449   ALBUMIN 3.3 (L) 04/04/2014 1019   AST 22 01/28/2021 1943   AST 41 01/17/2021 1450   AST 30 04/04/2014 1019   ALT 16 01/28/2021 1943   ALT 34 01/17/2021 1450  ALT 25 04/04/2014 1019   ALKPHOS 107 01/28/2021 1943   ALKPHOS 140 04/04/2014 1019   BILITOT 0.9 01/28/2021 1943   BILITOT 0.8 01/17/2021 1450   BILITOT 0.43 04/04/2014 1019   GFRNONAA 22 (L) 01/28/2021 1943   GFRNONAA 39 (L) 01/17/2021 1450   GFRAA  33 (L) 11/10/2019 1245    Pertinent Imaging: CT Head Wo Contrast  Result Date: 01/28/2021 CLINICAL DATA:  Mental status change, unknown cause EXAM: CT HEAD WITHOUT CONTRAST TECHNIQUE: Contiguous axial images were obtained from the base of the skull through the vertex without intravenous contrast. COMPARISON:  None. BRAIN: BRAIN Cerebral ventricle sizes are concordant with the degree of cerebral volume loss. Patchy and confluent areas of decreased attenuation are noted throughout the deep and periventricular white matter of the cerebral hemispheres bilaterally, compatible with chronic microvascular ischemic disease. No evidence of large-territorial acute infarction. No parenchymal hemorrhage. No mass lesion. No extra-axial collection. No mass effect or midline shift. No hydrocephalus. Basilar cisterns are patent. Vascular: No hyperdense vessel. Atherosclerotic calcifications are present within the cavernous internal carotid arteries. Skull: No acute fracture or focal lesion. Sinuses/Orbits: Paranasal sinuses and mastoid air cells are clear. The orbits are unremarkable. Other: None. IMPRESSION: No acute intracranial abnormality. Electronically Signed   By: Iven Finn M.D.   On: 01/28/2021 19:28   DG Chest Portable 1 View  Result Date: 01/28/2021 CLINICAL DATA:  Altered mental status EXAM: PORTABLE CHEST 1 VIEW COMPARISON:  01/09/2021 FINDINGS: Lungs are clear. No pneumothorax or pleural effusion. Right internal jugular chest port tip noted within the superior cavoatrial junction. Cardiac size within normal limits. Pulmonary vascularity is normal. No acute bone abnormality. IMPRESSION: No active disease. Electronically Signed   By: Fidela Salisbury M.D.   On: 01/28/2021 20:20    EKG: personally reviewed my interpretation is normal EKG, normal sinus rhythm, unchanged from previous tracings, normal sinus rhythm  ASSESSMENT & PLAN:  Assessment: Active Problems:   * No active hospital problems.  *   Halana Deisher Crute is a 80 y.o. with pertinent PMH of hereditary hemorrhagic telangiectasias, recent hospitalization with severe GI bleeding from a gastric ulcer, recent seizures, Insulin dependent type 2 DM , CKD stage 3a, HTN, HLD and dementia who presented with altered mental status and admit for acute kidney injury on hospital day 0  Plan: #AKI on CKD #Hypernatremia Creatinine at 2.25 with GFR of 22 and BUN elevated at 40, baseline appears to be around 1.4.  Patient with decreased p.o. intake for the last day, AKI likely prerenal in etiology.  Is likely postrenal as patient has had appropriate amount of urine.  Intrinsic injury less likely given normal UA. Sodium corrected for glucose at 147 and Free water deficit of 1.3 L. 1 L normal saline given in ED.  -Repeat BMP -Encourage p.o. hydration -Avoid nephrotoxic agents  #Altered Mental Status-improving Patient presented with decreased responsiveness and altered mental status since morning of January 28, 2021.  Daughter states that she went to the endocrinologist yesterday and was acting normal at that time.  Her daughter states that she has been sleeping more following being discharged from hospital on 14 December.  Patient does have a history of seizures last one was in 2016 and daughter reports that she has been taking Keppra as prescribed.  Stroke was also initially considered upon arrival to ED due to patient not moving left arm.  CT head was negative, MRI ordered.  Once daughter arrived she stated that patient chronically had difficulty with  moving her left arm.  It is also possible that altered mental status secondary to dehydration from decreased p.o. intake.  This could represent a progression of dementia with decreased thirst.  -Check Keppra level -Follow-up on MRI head  #Hyperlactatemia Lactate elevated at 2.6.  #Hx Hereditary hemorrhagic telangectasia syndrome #Hx of UGIB s/p clipping #Iron deficiency anemia Hemoglobin of  11.5 with MCV of 95.4. Likely that Hgb is a [place] lower with patient being dehydrated, but stable from hgb of 9 at discharge on 12/14.  Patient is unable to report if she has had darkened stool or bloody emesis, but daughter states nurses at SNF have not notified her of such.  -Repeat CBC  #Thrombocytopenia Platelets at 142, which has been chronically low since around 2019.   #Dementia Continue home medication of memantine 10 mg.  Her daughter states that at baseline she is able to recognize family member but her ability to complete her ADLs has greatly diminished with recent hospitalizations over the last 2 months.  Unclear if decreased p.o. intake is secondary to progression of dementia.  We will continue to evaluate for other causes of altered mental status, however if none found family may benefit from conversation about disease course of dementia.  -Continue memantine 10 mg  #Type 2 diabetes with #Diabetic neuropathy Last A1c 7.8 Home medications include Humalog 10 units before breakfast, Admelog 10 units before each meal, Trulicity 1.5 mg once weekly, and gabapentin 100 mg twice daily.  -CBG x4 with meals and bedtime -SSI  #HFpEF Last echo done in August 2022 with EF of 65 to 70%.  Patient on torsemide 20 mg daily at home.  #History of seizures History of seizure in 2016.  Medication of levetiracetam 500 mg BID  -levetiracetam level, if elevated could be another explanation for altered mental status -Continue home medication of levetiracetam 500 mg BID  #GERD Continue home medication of Protonix 40 mg  #Hypertension Home medications include amlodipine 2.5 mg   Best Practice: Diet: Encourage fluids following swallow study IVF: Fluids: LR, Rate:  100 cc/hr x 10 hrs VTE:   Lovenox Code: Full AB: none Status: Observation with expected length of stay less than 2 midnights. Anticipated Discharge Location: SNF Barriers to Discharge: Medical stability  Signature: Daleen Bo. Lavere Shinsky, D.O.  Internal Medicine Resident, PGY-1 [name]  Pager: [phone] 10:57 PM, 01/28/2021   Please contact the on call pager after 5 pm and on weekends at [phone].

## 2021-01-28 NOTE — ED Provider Notes (Signed)
[facility] EMERGENCY DEPARTMENT Provider Note   CSN: [id number] Arrival date & time: 01/28/21  1829     History Chief Complaint  Patient presents with   Altered Mental Status    [name] is a 80 y.o. female.  Pt presents to the ED today with AMS.  Pt was admitted from 12/10-14 for UGIB and aki. She has "not been the same" per CNA at the SNF.  Pt is normally alert and oriented.  She did see her endocrinologist yesterday and seemed to be ok then.  It is unclear when she was LSN.    Pt is unable to give any hx.      Past Medical History:  Diagnosis Date   Chronic anemia    Chronic diastolic CHF (congestive heart failure) ([place]) 10/03/2013   Chronic GI bleeding    /notes 11/29/2014   Family history of anesthesia complication    "niece has a hard time coming out" (09/15/2012)   Frequent nosebleeds    chronic   Gastric AV malformation    /notes 11/29/2014   GERD (gastroesophageal reflux disease)    Heart murmur 04/01/2017   Moderate AVSC on echo 09/2016   History of blood transfusion "several"   History of epistaxis    HTN (hypertension), benign 03/02/2012   Hyperlipidemia    Iron deficiency anemia    chronic infusions"   Lichen planus    Both lower extremities   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    Patient Active Problem List   Diagnosis Date Noted   GI bleed 01/18/2021   Bullous lesion 10/08/2020   Hearing loss 05/29/2020   Neuropathy 07/27/2019   Hyperkalemia 10/18/2018   Port-A-Cath in place 05/23/2018   Iron deficiency anemia due to chronic blood loss 04/19/2018   Acute right hip pain 03/10/2018   Hyperpigmented skin lesion 02/07/2018    Leg weakness, bilateral 12/23/2017   Hematemesis 12/04/2017   Pain localized to upper abdomen 10/05/2017   Lower extremity pain, bilateral 07/15/2017   Pulmonary HTN ([place]) 04/01/2017   Heart murmur 04/01/2017   Overgrown toenails 03/18/2017   Osteopenia after menopause 01/13/2017   Epistaxis 07/30/2016   Venous insufficiency of both lower extremities 04/27/2016   Anemia due to chronic blood loss    Dry skin 02/17/2016   Peripheral vascular disease ([place]) 12/30/2015   Pain in joint of right shoulder 10/22/2015   PAF (paroxysmal atrial fibrillation) ([place]) 57/84/6962   Lichen planus 95/28/4132   Healthcare maintenance 06/20/2015   Seborrheic keratosis 06/20/2015   Memory loss 01/30/2015   Simple febrile convulsions ([place]) 10/21/2014   Hyperammonemia ([place]) 10/03/2014   CKD (chronic kidney disease), stage III ([name]    Seizures ([place]) 09/2014   Acute kidney injury ([place]) 08/27/2014   Chronic diastolic CHF (congestive heart failure) ([place]) 10/03/2013   Acute on chronic heart failure with preserved ejection fraction (HFpEF) ([place]) 10/03/2013   Type 2 diabetes mellitus without complication ([place]) 44/02/270   HTN (hypertension), benign 03/02/2012   Fatigue 07/10/2011   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    Past Surgical History:  Procedure  Laterality Date   CATARACT EXTRACTION     "I think it was just one eye"   ESOPHAGOGASTRODUODENOSCOPY  02/26/2011   Procedure: ESOPHAGOGASTRODUODENOSCOPY (EGD);  Surgeon: [name], MD;  Location: [name];  Service: Endoscopy;  Laterality: N/A;   ESOPHAGOGASTRODUODENOSCOPY N/A 11/08/2012   Procedure: ESOPHAGOGASTRODUODENOSCOPY (EGD);  Surgeon: [name], MD;  Location: [name];  Service: Endoscopy;  Laterality: N/A;   ESOPHAGOGASTRODUODENOSCOPY N/A 10/04/2013   Procedure: ESOPHAGOGASTRODUODENOSCOPY (EGD);  Surgeon: Winfield Cunas., MD;  Location: [name];  Service: Endoscopy;   Laterality: N/A;  with APC on stand-by   ESOPHAGOGASTRODUODENOSCOPY N/A 07/06/2014   Procedure: ESOPHAGOGASTRODUODENOSCOPY (EGD);  Surgeon: Clarene Essex, MD;  Location: [name];  Service: Endoscopy;  Laterality: N/A;   ESOPHAGOGASTRODUODENOSCOPY N/A 09/05/2014   Procedure: ESOPHAGOGASTRODUODENOSCOPY (EGD);  Surgeon: Laurence Spates, MD;  Location: [name];  Service: Endoscopy;  Laterality: N/A;  APC on standby to control bleeding   ESOPHAGOGASTRODUODENOSCOPY N/A 11/29/2014   Procedure: ESOPHAGOGASTRODUODENOSCOPY (EGD);  Surgeon: [name], MD;  Location: [facility] ENDOSCOPY;  Service: Endoscopy;  Laterality: N/A;   ESOPHAGOGASTRODUODENOSCOPY N/A 09/28/2015   Procedure: ESOPHAGOGASTRODUODENOSCOPY (EGD);  Surgeon: Clarene Essex, MD;  Location: [facility] ENDOSCOPY;  Service: Endoscopy;  Laterality: N/A;   ESOPHAGOGASTRODUODENOSCOPY (EGD) WITH PROPOFOL N/A 12/04/2017   Procedure: ESOPHAGOGASTRODUODENOSCOPY (EGD) WITH PROPOFOL;  Surgeon: [name], MD;  Location: [facility];  Service: Endoscopy;  Laterality: N/A;   ESOPHAGOGASTRODUODENOSCOPY (EGD) WITH PROPOFOL N/A 12/24/2020   Procedure: ESOPHAGOGASTRODUODENOSCOPY (EGD) WITH PROPOFOL;  Surgeon: Arta Silence, MD;  Location: [facility];  Service: Endoscopy;  Laterality: N/A;   ESOPHAGOGASTRODUODENOSCOPY (EGD) WITH PROPOFOL N/A 01/19/2021   Procedure: ESOPHAGOGASTRODUODENOSCOPY (EGD) WITH PROPOFOL;  Surgeon: [name], MD;  Location: [facility];  Service: Endoscopy;  Laterality: N/A;   ESOPHAGOGASTRODUODENOSCOPY ENDOSCOPY  08/19/2006   with laser treatment   HEMOSTASIS CLIP PLACEMENT  12/24/2020   Procedure: HEMOSTASIS CLIP PLACEMENT;  Surgeon: Arta Silence, MD;  Location: [facility] ENDOSCOPY;  Service: Endoscopy;;   HEMOSTASIS CLIP PLACEMENT  01/19/2021   Procedure: HEMOSTASIS CLIP PLACEMENT;  Surgeon: [name], MD;  Location: [facility];  Service: Endoscopy;;   HOT HEMOSTASIS N/A 07/06/2014   Procedure: HOT HEMOSTASIS (ARGON  PLASMA COAGULATION/BICAP);  Surgeon: Clarene Essex, MD;  Location: [name];  Service: Endoscopy;  Laterality: N/A;   HOT HEMOSTASIS N/A 09/28/2015   Procedure: HOT HEMOSTASIS (ARGON PLASMA COAGULATION/BICAP);  Surgeon: Clarene Essex, MD;  Location: [facility] ENDOSCOPY;  Service: Endoscopy;  Laterality: N/A;   HOT HEMOSTASIS N/A 12/04/2017   Procedure: HOT HEMOSTASIS (ARGON PLASMA COAGULATION/BICAP);  Surgeon: [name], MD;  Location: [place];  Service: Endoscopy;  Laterality: N/A;   NASAL HEMORRHAGE CONTROL     "for bleeding"    SAVORY DILATION  02/26/2011   Procedure: SAVORY DILATION;  Surgeon: [name], MD;  Location: [name];  Service: Endoscopy;  Laterality: N/A;  c-arm needed   SCLEROTHERAPY  12/24/2020   Procedure: SCLEROTHERAPY;  Surgeon: Arta Silence, MD;  Location: [facility] ENDOSCOPY;  Service: Endoscopy;;   SCLEROTHERAPY  01/19/2021   Procedure: Clide Deutscher;  Surgeon: [name], MD;  Location: [facility] ENDOSCOPY;  Service: Endoscopy;;   SUBMUCOSAL INJECTION  12/04/2017   Procedure: SUBMUCOSAL INJECTION;  Surgeon: [name], MD;  Location: [facility] ENDOSCOPY;  Service: Endoscopy;;     OB History   No obstetric history on file.     Family History  Problem Relation Age of Onset   Healthy Mother    Stroke Father    Diabetes Niece    Breast cancer Other  Malignant hyperthermia Neg Hx    Seizures Neg Hx     Social History   Tobacco Use   Smoking status: Former    Packs/day: 1.00    Years: 20.00    Pack years: 20.00    Types: Cigarettes    Quit date: 02/10/1971    Years since quitting: 50.0   Smokeless tobacco: Never   Tobacco comments:    09/15/2012 "smoked 50-60 yr ago"  Vaping Use   Vaping Use: Never used  Substance Use Topics   Alcohol use: No    Alcohol/week: 0.0 standard drinks   Drug use: No    Home Medications Prior to Admission medications   Medication Sig Start Date End Date Taking? Authorizing Provider  ACCU-CHEK GUIDE test strip USE  AS INSTRUCTED TO CHECK BLOOD SUGAR 3 TIMES DAILY. Patient taking differently: 1 each by Other route in the morning, at noon, and at bedtime. 08/08/20   Elayne Snare, MD  Accu-Chek Softclix Lancets lancets 1 each by Other route 3 (three) times daily. Use as instructed to check blood sugar 3 times per day dx code E11.65 02/01/20   Elayne Snare, MD  amLODipine (NORVASC) 2.5 MG tablet Take 1 tablet (2.5 mg total) by mouth every morning. 01/15/21   Eugenie Filler, MD  Continuous Blood Gluc Sensor (FREESTYLE LIBRE 2 SENSOR) MISC 2 Devices by Does not apply route every 14 (fourteen) days. 04/25/20   Elayne Snare, MD  diclofenac Sodium (VOLTAREN) 1 % GEL Apply 2 g topically 4 (four) times daily. Patient taking differently: Apply 2 g topically 4 (four) times daily as needed (pain). 12/26/20   Lacinda Axon, MD  Elastic Bandages & Supports ([place]) MISC Wear as much as possible while awake to reduce swelling 04/27/16   Minus Liberty, MD  gabapentin (NEURONTIN) 100 MG capsule TAKE 1 CAPSULE BY MOUTH TWICE DAILY Patient taking differently: Take 100 mg by mouth 2 (two) times daily. 08/16/20   Katsadouros, Vasilios, MD  glucose blood (ACCU-CHEK GUIDE) test strip 1 each by Other route in the morning, at noon, and at bedtime. 07/13/20   [provider]  HUMALOG MIX 75/25 KWIKPEN (75-25) 100 UNIT/ML Kwikpen INJECT 10 UNITS SUBCUTANEOUSLY ONCE DAILY Patient taking differently: Inject 10 Units into the skin daily. 04/17/20   Elayne Snare, MD  insulin lispro (HUMALOG KWIKPEN) 100 UNIT/ML KwikPen 3 units before dinner Patient taking differently: Inject 2 Units into the skin 3 (three) times daily before meals. 10/03/20   Elayne Snare, MD  Insulin Pen Needle (B-D UF III MINI PEN NEEDLES) 31G X 5 MM MISC USE TO INJECT INSULIN THREE TIMES A DAY Patient taking differently: 1 each by Other route See admin instructions. USE TO INJECT INSULIN THREE TIMES A DAY 12/23/20   Elayne Snare, MD  Lancets  MISC 1 each by Other route daily. 02/01/20   [provider]  levETIRAcetam (KEPPRA) 500 MG tablet TAKE 1 TABLET BY MOUTH TWICE DAILY Patient taking differently: Take 500 mg by mouth 2 (two) times daily. 06/07/20   [name], Areeg N, DO  memantine (NAMENDA) 10 MG tablet TAKE 1 TABLET BY MOUTH TWICE DAILY Patient taking differently: Take 10 mg by mouth 2 (two) times daily. 11/11/20   [name], Areeg N, DO  Misc Natural Products (IMMUNE FORMULA PO) Take 1 tablet by mouth daily.    [provider]  Multiple Vitamins-Minerals (MULTI FOR HER 50+) TABS Take 1 tablet by mouth daily with breakfast.    [provider]  pantoprazole (PROTONIX) 40 MG tablet Take 1 tablet (40 mg total) by mouth 2 (two) times daily. 12/26/20   Lacinda Axon, MD  potassium chloride SA (KLOR-CON M) 20 MEQ tablet Take 20 mEq by mouth 2 (two) times daily.    [provider]  torsemide (DEMADEX) 20 MG tablet TAKE 1 TABLET BY MOUTH EVERY MORNING 01/28/21   [name], Areeg N, DO  triamcinolone ointment (KENALOG) 0.5 % APPLY 1 APPLICATION TOPICALLY TO RASH TWICE DAILY FOR ITCHING 08/16/20   Katsadouros, Vasilios, MD  TRULICITY 1.5 QZ/3.0QT SOPN INJECT 1.5MG  SUBCUTANEOUSLY EVERY WEEK ON THURSDAY Patient taking differently: Inject 1.5 mg into the skin every Thursday. 07/29/20   Elayne Snare, MD    Allergies    Aspirin  Review of Systems   Review of Systems  Unable to perform ROS: Mental status change   Physical Exam Updated Vital Signs BP (!) 144/65    Pulse 78    Temp (!) 97.2 F (36.2 C) (Axillary)    Resp 16    LMP  (LMP Unknown)    SpO2 99%   Physical Exam Vitals and nursing note reviewed.  Constitutional:      Appearance: Normal appearance.  HENT:     Head: Normocephalic and atraumatic.     Right Ear: External ear normal.     Left Ear: External ear normal.     Nose: Nose normal.     Mouth/Throat:     Mouth: Mucous membranes are moist.     Pharynx: Oropharynx is clear.  Eyes:      Extraocular Movements: Extraocular movements intact.     Conjunctiva/sclera: Conjunctivae normal.     Pupils: Pupils are equal, round, and reactive to light.  Cardiovascular:     Rate and Rhythm: Normal rate and regular rhythm.     Pulses: Normal pulses.     Heart sounds: Normal heart sounds.  Pulmonary:     Effort: Pulmonary effort is normal.     Breath sounds: Normal breath sounds.  Abdominal:     General: Abdomen is flat. Bowel sounds are normal.     Palpations: Abdomen is soft.  Musculoskeletal:        General: Normal range of motion.     Cervical back: Normal range of motion and neck supple.  Skin:    General: Skin is warm.     Capillary Refill: Capillary refill takes less than 2 seconds.  Neurological:     Mental Status: She is alert.     Comments: Pt is sitting slumped forward and won't lie back.  She is moving her right side more than her left.  To every question, she answers yes.  She will follow some simple commands on the right.    ED Results / Procedures / Treatments   Labs (all labs ordered are listed, but only abnormal results are displayed) Labs Reviewed  CBC WITH DIFFERENTIAL/PLATELET - Abnormal; Notable for the following components:      Result Value   RBC 3.67 (*)    Hemoglobin 11.5 (*)    HCT 35.0 (*)    Platelets 142 (*)    All other components within normal limits  COMPREHENSIVE METABOLIC PANEL - Abnormal; Notable for the following components:   Sodium 146 (*)    Glucose, Bld 180 (*)    BUN 40 (*)    Creatinine, Ser 2.25 (*)    Total Protein 6.3 (*)    Albumin 3.1 (*)    GFR, Estimated 22 (*)  All other components within normal limits  LACTIC ACID, PLASMA - Abnormal; Notable for the following components:   Lactic Acid, Venous 2.6 (*)    All other components within normal limits  CBG MONITORING, ED - Abnormal; Notable for the following components:   Glucose-Capillary 158 (*)    All other components within normal limits  RESP PANEL BY RT-PCR (FLU  A&B, COVID) [id number]  URINALYSIS, ROUTINE W REFLEX MICROSCOPIC    EKG None  Radiology CT Head Wo Contrast  Result Date: 01/28/2021 CLINICAL DATA:  Mental status change, unknown cause EXAM: CT HEAD WITHOUT CONTRAST TECHNIQUE: Contiguous axial images were obtained from the base of the skull through the vertex without intravenous contrast. COMPARISON:  None. BRAIN: BRAIN Cerebral ventricle sizes are concordant with the degree of cerebral volume loss. Patchy and confluent areas of decreased attenuation are noted throughout the deep and periventricular white matter of the cerebral hemispheres bilaterally, compatible with chronic microvascular ischemic disease. No evidence of large-territorial acute infarction. No parenchymal hemorrhage. No mass lesion. No extra-axial collection. No mass effect or midline shift. No hydrocephalus. Basilar cisterns are patent. Vascular: No hyperdense vessel. Atherosclerotic calcifications are present within the cavernous internal carotid arteries. Skull: No acute fracture or focal lesion. Sinuses/Orbits: Paranasal sinuses and mastoid air cells are clear. The orbits are unremarkable. Other: None. IMPRESSION: No acute intracranial abnormality. Electronically Signed   By: Iven Finn M.D.   On: 01/28/2021 19:28   DG Chest Portable 1 View  Result Date: 01/28/2021 CLINICAL DATA:  Altered mental status EXAM: PORTABLE CHEST 1 VIEW COMPARISON:  01/09/2021 FINDINGS: Lungs are clear. No pneumothorax or pleural effusion. Right internal jugular chest port tip noted within the superior cavoatrial junction. Cardiac size within normal limits. Pulmonary vascularity is normal. No acute bone abnormality. IMPRESSION: No active disease. Electronically Signed   By: Fidela Salisbury M.D.   On: 01/28/2021 20:20    Procedures Procedures   Medications Ordered in ED Medications  sodium chloride 0.9 % bolus 1,000 mL (1,000 mLs Intravenous New Bag/Given 01/28/21 2136)    ED Course  I have  reviewed the triage vital signs and the nursing notes.  Pertinent labs & imaging results that were available during my care of the patient were reviewed by me and considered in my medical decision making (see chart for details).    MDM Rules/Calculators/A&P                         Pt is in AKI again.  Cr up to 2.25 from 1.36 on 12/14.  She also has a lactic of 2.6.  She is given IVFs and is a [place] more alert.  Her family member is with her now and said she has pain in her left shoulder and does not move her left arm well normally.  Pt d/w IMTS for admission.  CRITICAL CARE Performed by: Isla Pence   Total critical care time: 30 minutes  Critical care time was exclusive of separately billable procedures and treating other patients.  Critical care was necessary to treat or prevent imminent or life-threatening deterioration.  Critical care was time spent personally by me on the following activities: development of treatment plan with patient and/or surrogate as well as nursing, discussions with consultants, evaluation of patient's response to treatment, examination of patient, obtaining history from patient or surrogate, ordering and performing treatments and interventions, ordering and review of laboratory studies, ordering and review of radiographic studies, pulse oximetry and re-evaluation of  patient's condition.   Final Clinical Impression(s) / ED Diagnoses Final diagnoses:  AKI (acute kidney injury) ([place])  Acute metabolic encephalopathy  Dehydration    Rx / DC Orders ED Discharge Orders     None        Isla Pence, MD 01/28/21 2312

## 2021-01-28 NOTE — Hospital Course (Signed)
Not eating and drinking.   Doing well at SNF. Had acute decline in mental status. Not eating breakfast. Confused. Did not know who spouse was knew who her daughter was whe nthe ambulance picked her up.  Has these episodes. Where she won't eat or drink.    Can have conversation with daugheer but sleeps alots. Days where she won't eat or dirnk or take medications.   Feels okay no pain.    No N/V no constipation or diarrhea. No darkening of stools.

## 2021-01-28 NOTE — ED Triage Notes (Signed)
Pt BIB EMS from SNF for alt. Mental status. She was hospitalized 10 days ago and has "not been the same since, per CNA at SNF." Pt is normally a/ox4 and ambulatory per family. At this time she is unable to communicate needs

## 2021-01-28 NOTE — Telephone Encounter (Signed)
Confirmed 14 day ZIO XT long term holter monitor to be mailed to [facility] in care of [facility], [address], [place], [place],   [id number].  Attn: DON

## 2021-01-29 ENCOUNTER — Emergency Department (HOSPITAL_COMMUNITY): Payer: Medicare Other

## 2021-01-29 DIAGNOSIS — G9341 Metabolic encephalopathy: Secondary | ICD-10-CM

## 2021-01-29 DIAGNOSIS — R4182 Altered mental status, unspecified: Secondary | ICD-10-CM | POA: Diagnosis not present

## 2021-01-29 DIAGNOSIS — E860 Dehydration: Secondary | ICD-10-CM

## 2021-01-29 DIAGNOSIS — N179 Acute kidney failure, unspecified: Secondary | ICD-10-CM

## 2021-01-29 LAB — BASIC METABOLIC PANEL
Anion gap: 7 (ref 5–15)
Anion gap: 9 (ref 5–15)
Anion gap: 6 (ref 5–15)
BUN: 35 mg/dL — ABNORMAL HIGH (ref 8–23)
BUN: 32 mg/dL — ABNORMAL HIGH (ref 8–23)
BUN: 29 mg/dL — ABNORMAL HIGH (ref 8–23)
CO2: 25 mmol/L (ref 22–32)
CO2: 25 mmol/L (ref 22–32)
CO2: 25 mmol/L (ref 22–32)
Calcium: 8.7 mg/dL — ABNORMAL LOW (ref 8.9–10.3)
Calcium: 8.7 mg/dL — ABNORMAL LOW (ref 8.9–10.3)
Calcium: 8.1 mg/dL — ABNORMAL LOW (ref 8.9–10.3)
Chloride: 117 mmol/L — ABNORMAL HIGH (ref 98–111)
Chloride: 113 mmol/L — ABNORMAL HIGH (ref 98–111)
Chloride: 112 mmol/L — ABNORMAL HIGH (ref 98–111)
Creatinine, Ser: 1.93 mg/dL — ABNORMAL HIGH (ref 0.44–1.00)
Creatinine, Ser: 1.78 mg/dL — ABNORMAL HIGH (ref 0.44–1.00)
Creatinine, Ser: 1.55 mg/dL — ABNORMAL HIGH (ref 0.44–1.00)
GFR, Estimated: 26 mL/min — ABNORMAL LOW (ref >60)
GFR, Estimated: 29 mL/min — ABNORMAL LOW (ref >60)
GFR, Estimated: 34 mL/min — ABNORMAL LOW (ref >60)
Glucose, Bld: 155 mg/dL — ABNORMAL HIGH (ref 70–99)
Glucose, Bld: 268 mg/dL — ABNORMAL HIGH (ref 70–99)
Glucose, Bld: 254 mg/dL — ABNORMAL HIGH (ref 70–99)
Potassium: 3.9 mmol/L (ref 3.5–5.1)
Potassium: 3.6 mmol/L (ref 3.5–5.1)
Potassium: 3.4 mmol/L — ABNORMAL LOW (ref 3.5–5.1)
Sodium: 149 mmol/L — ABNORMAL HIGH (ref 135–145)
Sodium: 147 mmol/L — ABNORMAL HIGH (ref 135–145)
Sodium: 143 mmol/L (ref 135–145)

## 2021-01-29 LAB — CBC
HCT: 28.8 % — ABNORMAL LOW (ref 36.0–46.0)
Hemoglobin: 9.5 g/dL — ABNORMAL LOW (ref 12.0–15.0)
MCH: 32 pg (ref 26.0–34.0)
MCHC: 33 g/dL (ref 30.0–36.0)
MCV: 97 fL (ref 80.0–100.0)
Platelets: 114 10*3/uL — ABNORMAL LOW (ref 150–400)
RBC: 2.97 MIL/uL — ABNORMAL LOW (ref 3.87–5.11)
RDW: 15.6 % — ABNORMAL HIGH (ref 11.5–15.5)
WBC: 5.5 10*3/uL (ref 4.0–10.5)
nRBC: 0 % (ref 0.0–0.2)

## 2021-01-29 LAB — GLUCOSE, CAPILLARY
Glucose-Capillary: 224 mg/dL — ABNORMAL HIGH (ref 70–99)
Glucose-Capillary: 271 mg/dL — ABNORMAL HIGH (ref 70–99)

## 2021-01-29 LAB — CBG MONITORING, ED
Glucose-Capillary: 151 mg/dL — ABNORMAL HIGH (ref 70–99)
Glucose-Capillary: 140 mg/dL — ABNORMAL HIGH (ref 70–99)
Glucose-Capillary: 240 mg/dL — ABNORMAL HIGH (ref 70–99)

## 2021-01-29 MED ORDER — DEXTROSE 5 % IV SOLN: INTRAVENOUS | Status: AC

## 2021-01-29 MED ORDER — POTASSIUM CHLORIDE CRYS ER 20 MEQ PO TBCR
40.0000 meq | EXTENDED_RELEASE_TABLET | Freq: Two times a day (BID) | ORAL | Status: AC
  Administered 2021-01-29 – 2021-01-30 (×2): 40 meq via ORAL
  Filled 2021-01-29 (×2): qty 2

## 2021-01-29 MED ORDER — SODIUM CHLORIDE 0.45 % IV SOLN: INTRAVENOUS | Status: DC

## 2021-01-29 MED ORDER — CHLORHEXIDINE GLUCONATE CLOTH 2 % EX PADS
6.0000 | MEDICATED_PAD | Freq: Every day | CUTANEOUS | Status: DC
  Administered 2021-01-29 – 2021-01-30 (×2): 6 via TOPICAL

## 2021-01-29 NOTE — Progress Notes (Signed)
Subjective:  Overnight Events: No acute events overnight  Patient was seen and examined on rounds.  Patient was not aware she is in the emergency department.  Requesting to go home.  Denied SOB, cough, dysuria.  No complaints today, patient reports hospital is cold.  Objective:  Vital signs in last 24 hours: Vitals:   01/29/21 1215 01/29/21 1230 01/29/21 1245 01/29/21 1300  BP: (!) 135/51 (!) 120/47 (!) 130/48 (!) 117/49  Pulse: 66 64 65 67  Resp: $Remo'10 15 14 12  'qMznk$ Temp:      TempSrc:      SpO2: 100% 100% 100% 99%    No intake or output data in the 24 hours ending 01/29/21 1318  Physical Exam: General: Well appearing elderly African-American female, NAD HENT: normocephalic, atraumatic EYES: conjunctiva non-erythematous, no scleral icterus CV: regular rate, normal rhythm, no murmurs, rubs, gallops.  Trace peripheral LE edema bilaterally. Pulmonary: normal work of breathing on RA, lungs clear to auscultation, no rales, wheezes, rhonchi Abdominal: non-distended, soft, non-tender to palpation, normal BS Skin: Warm and dry, no rashes or lesions Neurological: MS: awake, alert and oriented x3, normal speech and fund of knowledge Motor: moves all extremities antigravity Psych: normal affect   Assessment/Plan:  Active Problems:   AKI (acute kidney injury) ([place])    [name] is a 80 y.o. with pertinent PMH of hereditary hemorrhagic telangiectasias, recent hospitalization with severe GI bleeding from a gastric ulcer s/p clipping, hx of epilepsy, Insulin dependent type 2 DM , CKD stage 3a, HTN, HLD and dementia who presented with altered mental status and admitted for acute kidney injury.  #AKI on CKD #Hypernatremia, resolving #Hypokalemia Creatinine at 2.25 with GFR of 22 and BUN elevated at 40, baseline creatinine 1.4, baseline eGFR 35-40.  Likely prerenal AKI secondary to dehydration from poor p.o. intake.  Patient also on torsemide which may have contributed.  On admission  sodium 146, sodium has increased to 149 this morning. Patient received normal saline in the ED and was switched to dextrose 5 this morning.  Normal saline in the ED likely contributed to patient's increasing hypernatremia.  Plan: -Repeat BMP 1400, improvement in hypernatremia, potassium low 3.4 -Dextrose 5 75 cc/hr for 12 hours -Repletion of electrolytes as needed -Encourage p.o. intake -Avoid nephrotoxic agents -Trend kidney function -PT/OT recommending SNF  #Altered Mental Status #Hx Seizures #MCI Patient presented with confusion, not able to identify family members.  Oriented to self and time, not aware she was in the emergency room, unsure of what brought her to the emergency room.  Remembered she did come from rehab facility.  Brain imaging negative for acute process.  Lower concern for seizure activity as cause of AMS given history and lab findings, Keppra can also cause sedation/confusion, checking Keppra level.  More likely AMS secondary to AKI from dehydration.  Patient has history of mild cognitive impairment and reported history of dementia, is high risk for hospital induced delirium. Plan: -Levetiracetam level in process -Continue Keppra 500 mg twice daily -Management of AKI as above -Delirium precautions -Continue memantine 10 mg  #Hx Hereditary hemorrhagic telangectasia syndrome #Hx of UGIB s/p clipping #Iron deficiency anemia On admission, hemoglobin 11.5, consistent with mild normocytic anemia, stable from prior hospitalization.  Hemoglobin did decrease to 9.5 in the setting of fluid resuscitation for AKI.  No concern for acute blood loss at this time. Plan: -Monitor hemoglobin  #Type 2 diabetes with #Diabetic neuropathy Last A1c 7.8%. Home medications include Humalog 10 units before breakfast, Admelog  10 units before each meal, Trulicity 1.5 mg once weekly, and gabapentin 100 mg twice daily. Plan: -CBG x4 with meals and bedtime -SSI  #HFpEF Last echo done in  August 2022 with EF of 65 to 70%.  Patient on torsemide 20 mg daily at home.  Holding torsemide in the setting of AKI and dehydration. Considering every other day dosing or PRN dosing at discharge.  #GERD Continued home medication of Protonix 40 mg   #Hypertension Home medications include amlodipine 2.5 mg continued home amlodipine 2.5 mg daily.  Diet: Carb modified VTE: Lovenox IVF: 75 CC D5 additional 12 hours Code: Full  Prior to Admission Living Arrangement: SNF Anticipated Discharge Location: SNF Barriers to Discharge: Clinical improvement Dispo: Anticipated discharge in approximately 1 day(s).   Portions of this report may have been transcribed using voice recognition software. Every effort was made to ensure accuracy; however, inadvertent computerized transcription errors may be present.   Wayland Denis, MD 01/29/21,  1:18 PM Pager: [phone] Internal Medicine Resident, PGY-1 [name]

## 2021-01-29 NOTE — Evaluation (Signed)
Physical Therapy Evaluation Patient Details Name: [name] MRN: [id number] DOB: 08-08-1940 Today's Date: 01/29/2021  History of Present Illness  80 y.o. F admitted on 12/20 due to AMS. PMH of hereditary hemorrhagic telangiectasias, recent hospitalization with severe GI bleeding from a gastric ulcer, recent seizures, Insulin dependent type 2 DM , CKD stage 3a, HTN, HLD and dementia.  Clinical Impression  Patient presents with mobility limited due to generalized weakness, decreased strength and activity tolerance.  She will benefit from skilled PT in the acute setting to allow return to SNF level rehab prior to home.  Patient able to get up with min A overall and walk in hallway.  Limited safety with memory issues likely at recent baseline.         Recommendations for follow up therapy are one component of a multi-disciplinary discharge planning process, led by the attending physician.  Recommendations may be updated based on patient status, additional functional criteria and insurance authorization.  Follow Up Recommendations Skilled nursing-short term rehab (<3 hours/day)    Assistance Recommended at Discharge Intermittent Supervision/Assistance  Functional Status Assessment Patient has had a recent decline in their functional status and demonstrates the ability to make significant improvements in function in a reasonable and predictable amount of time.  Equipment Recommendations  None recommended by PT    Recommendations for Other Services       Precautions / Restrictions Precautions Precautions: Fall Restrictions Weight Bearing Restrictions: No      Mobility  Bed Mobility Overal bed mobility: Needs Assistance Bed Mobility: Supine to Sit Rolling: Modified independent (Device/Increase time) Sidelying to sit: Min assist Supine to sit: Min guard;HOB elevated Sit to supine: Min assist   General bed mobility comments: assist for lines and on high stretcher in ED     Transfers Overall transfer level: Needs assistance Equipment used: Rolling walker (2 wheels) Transfers: Sit to/from Stand Sit to Stand: Min assist           General transfer comment: assist for safety with high stretcher and lines    Ambulation/Gait Ambulation/Gait assistance: Min guard Gait Distance (Feet): 75 Feet Assistive device: Rolling walker (2 wheels) Gait Pattern/deviations: Step-through pattern;Decreased stride length;Shuffle;Wide base of support       General Gait Details: slow pace and tremulous (possibly due to being cold)  Stairs            Wheelchair Mobility    Modified Rankin (Stroke Patients Only)       Balance Overall balance assessment: Mild deficits observed, not formally tested Sitting-balance support: Feet supported Sitting balance-Leahy Scale: Fair     Standing balance support: Bilateral upper extremity supported;Reliant on assistive device for balance Standing balance-Leahy Scale: Poor                               Pertinent Vitals/Pain Pain Assessment: Faces Faces Pain Scale: Hurts Hoglund more Pain Location: LUE Pain Descriptors / Indicators: Grimacing;Guarding;Sore Pain Intervention(s): Monitored during session;Repositioned    Home Living Family/patient expects to be discharged to:: Skilled nursing facility Living Arrangements: Spouse/significant other Available Help at Discharge: Family Type of Home: House Home Access: Ramped entrance       Home Layout: One level Home Equipment: Conservation officer, nature (2 wheels);Shower seat;Rollator (4 wheels);BSC/3in1;Wheelchair - manual Additional Comments: Per pt, she lives with spouse who can assist her "a Rubin".  Niece is also supportive but can only provide intermittent assist.  Patient is poor historian due  to memory issues.    Prior Function Prior Level of Function : Needs assist       Physical Assist : Mobility (physical);ADLs (physical)     Mobility Comments: Pt  using RW at SNF for mobility ADLs Comments: Pt reports she can complete basic ADL's independently, suspect she requires more assist now at SNF     Hand Dominance   Dominant Hand: Right    Extremity/Trunk Assessment   Upper Extremity Assessment Upper Extremity Assessment: Defer to OT evaluation LUE Deficits / Details: Pain in LUE, increased weakness, difficulty lifting against gravity, only able to tolerate PROM to 90 degrees shoulder flexion LUE: Unable to fully assess due to pain;Shoulder pain with ROM LUE Sensation: decreased light touch LUE Coordination: decreased fine motor;decreased gross motor    Lower Extremity Assessment Lower Extremity Assessment: Generalized weakness    Cervical / Trunk Assessment Cervical / Trunk Assessment: Kyphotic  Communication   Communication: No difficulties  Cognition Arousal/Alertness: Awake/alert Behavior During Therapy: WFL for tasks assessed/performed Overall Cognitive Status: History of cognitive impairments - at baseline Area of Impairment: Memory;Safety/judgement;Problem solving                     Memory: Decreased short-term memory   Safety/Judgement: Decreased awareness of safety   Problem Solving: Slow processing;Requires verbal cues General Comments: Pt has dementia at baseline, very forgetful, repeating questions she asked in the beginning of the session.        General Comments General comments (skin integrity, edema, etc.): HR 76 after ambulation, no noted SOB and pt denies dizziness    Exercises     Assessment/Plan    PT Assessment Patient needs continued PT services  PT Problem List Decreased strength;Decreased activity tolerance;Decreased balance;Decreased mobility;Decreased safety awareness       PT Treatment Interventions DME instruction;Gait training;Functional mobility training;Balance training;Therapeutic exercise;Therapeutic activities;Patient/family education    PT Goals (Current goals can be  found in the Care Plan section)  Acute Rehab PT Goals Patient Stated Goal: agrees to return to rehab PT Goal Formulation: With patient Time For Goal Achievement: 02/12/21 Potential to Achieve Goals: Good    Frequency Min 2X/week   Barriers to discharge        Co-evaluation PT/OT/SLP Co-Evaluation/Treatment: Yes Reason for Co-Treatment: To address functional/ADL transfers PT goals addressed during session: Mobility/safety with mobility;Balance;Proper use of DME OT goals addressed during session: ADL's and self-care;Proper use of Adaptive equipment and DME;Strengthening/ROM       AM-PAC PT "6 Clicks" Mobility  Outcome Measure Help needed turning from your back to your side while in a flat bed without using bedrails?: A Leffler Help needed moving from lying on your back to sitting on the side of a flat bed without using bedrails?: A Coffelt Help needed moving to and from a bed to a chair (including a wheelchair)?: A Kissner Help needed standing up from a chair using your arms (e.g., wheelchair or bedside chair)?: A Seely Help needed to walk in hospital room?: A Ohms Help needed climbing 3-5 steps with a railing? : A Lot 6 Click Score: 17    End of Session Equipment Utilized During Treatment: Gait belt Activity Tolerance: Patient tolerated treatment well Patient left: in bed;with call bell/phone within reach Nurse Communication: Other (comment) (changed bed pad and brief) PT Visit Diagnosis: Other abnormalities of gait and mobility (R26.89);Muscle weakness (generalized) (M62.81);Pain Pain - Right/Left: Left Pain - part of body: Arm;Shoulder    Time: 1010-1037 PT Time Calculation (min) (ACUTE  ONLY): 27 min   Charges:   PT Evaluation $PT Eval Moderate Complexity: 1 Mod          Cyndi Afomia Blackley, PT Acute Rehabilitation Services JSIDX:[phone] Office:[phone] 01/29/2021   Reginia Naas 01/29/2021, 11:35 AM

## 2021-01-29 NOTE — ED Notes (Signed)
Pt back from MRI 

## 2021-01-29 NOTE — Evaluation (Signed)
Occupational Therapy Evaluation Patient Details Name: [name] MRN: [id number] DOB: 1940/03/31 Today's Date: 01/29/2021   History of Present Illness 80 y.o. F admitted on 12/20 due to AMS. PMH of hereditary hemorrhagic telangiectasias, recent hospitalization with severe GI bleeding from a gastric ulcer, recent seizures, Insulin dependent type 2 DM , CKD stage 3a, HTN, HLD and dementia.   Clinical Impression   Pt admitted for concerns listed above. PTA pt reported that she was at a SNF receiving rehab and ambulating with a RW. At this time, pt presents with decreased recall, poor safety awareness, weakness in LUE, and balance concerns. Pt requiring increased assist for all ADL's due to decreased safety awareness and need for mod-max verbal cuing throughout. Recommending return to SNF to continue progressing independence. OT will follow acutely to address concerns listed below.      Recommendations for follow up therapy are one component of a multi-disciplinary discharge planning process, led by the attending physician.  Recommendations may be updated based on patient status, additional functional criteria and insurance authorization.   Follow Up Recommendations  Skilled nursing-short term rehab (<3 hours/day)    Assistance Recommended at Discharge Frequent or constant Supervision/Assistance  Functional Status Assessment  Patient has had a recent decline in their functional status and demonstrates the ability to make significant improvements in function in a reasonable and predictable amount of time.  Equipment Recommendations  None recommended by OT    Recommendations for Other Services       Precautions / Restrictions Precautions Precautions: Fall Restrictions Weight Bearing Restrictions: No      Mobility Bed Mobility Overal bed mobility: Needs Assistance Bed Mobility: Supine to Sit Rolling: Modified independent (Device/Increase time) Sidelying to sit: Min assist Supine  to sit: Min guard;HOB elevated Sit to supine: Min assist   General bed mobility comments: assist for lines and on high stretcher in ED    Transfers Overall transfer level: Needs assistance Equipment used: Rolling walker (2 wheels) Transfers: Sit to/from Stand Sit to Stand: Min assist           General transfer comment: assist for safety with high stretcher and lines      Balance Overall balance assessment: Mild deficits observed, not formally tested Sitting-balance support: Feet supported Sitting balance-Leahy Scale: Fair     Standing balance support: Bilateral upper extremity supported;Reliant on assistive device for balance Standing balance-Leahy Scale: Poor                             ADL either performed or assessed with clinical judgement   ADL Overall ADL's : Needs assistance/impaired Eating/Feeding: Set up;Sitting;Cueing for sequencing   Grooming: Set up;Sitting;Cueing for sequencing   Upper Body Bathing: Minimal assistance;Sitting;Cueing for sequencing   Lower Body Bathing: Maximal assistance;Sitting/lateral leans;Sit to/from stand;Cueing for sequencing;Cueing for safety   Upper Body Dressing : Min guard;Sitting;Cueing for sequencing   Lower Body Dressing: Moderate assistance;Cueing for sequencing;Sitting/lateral leans;Sit to/from stand   Toilet Transfer: Minimal assistance;Cueing for safety;Cueing for sequencing;Ambulation   Toileting- Clothing Manipulation and Hygiene: Moderate assistance;Cueing for sequencing;Cueing for safety;Sitting/lateral lean;Sit to/from stand       Functional mobility during ADLs: Minimal assistance;Rolling walker (2 wheels);Cueing for safety;Cueing for sequencing General ADL Comments: Pt requiring increased cuing due to poor memory and decreased safety awareness     Vision Baseline Vision/History: 1 Wears glasses Ability to See in Adequate Light: 1 Impaired Patient Visual Report: No change from baseline Vision  Assessment?: No apparent visual deficits     Perception     Praxis      Pertinent Vitals/Pain Pain Assessment: Faces Faces Pain Scale: Hurts Bloodgood more Pain Location: LUE Pain Descriptors / Indicators: Grimacing;Guarding;Sore Pain Intervention(s): Monitored during session;Repositioned     Hand Dominance Right   Extremity/Trunk Assessment Upper Extremity Assessment Upper Extremity Assessment: Defer to OT evaluation LUE Deficits / Details: Pain in LUE, increased weakness, difficulty lifting against gravity, only able to tolerate PROM to 90 degrees shoulder flexion LUE: Unable to fully assess due to pain;Shoulder pain with ROM LUE Sensation: decreased light touch LUE Coordination: decreased fine motor;decreased gross motor   Lower Extremity Assessment Lower Extremity Assessment: Generalized weakness   Cervical / Trunk Assessment Cervical / Trunk Assessment: Kyphotic   Communication Communication Communication: No difficulties   Cognition Arousal/Alertness: Awake/alert Behavior During Therapy: WFL for tasks assessed/performed Overall Cognitive Status: History of cognitive impairments - at baseline Area of Impairment: Memory;Safety/judgement;Problem solving                     Memory: Decreased short-term memory   Safety/Judgement: Decreased awareness of safety   Problem Solving: Slow processing;Requires verbal cues General Comments: Pt has dementia at baseline, very forgetful, repeating questions she asked in the beginning of the session.     General Comments  HR 76 after ambulation, no noted SOB and pt denies dizziness    Exercises     Shoulder Instructions      Home Living Family/patient expects to be discharged to:: Skilled nursing facility Living Arrangements: Spouse/significant other Available Help at Discharge: Family Type of Home: House Home Access: Ramped entrance     Home Layout: One level     Bathroom Shower/Tub: Medical illustrator: Standard Bathroom Accessibility: Yes How Accessible: Accessible via walker Home Equipment: [place] (2 wheels);Shower seat;Rollator (4 wheels);BSC/3in1;Wheelchair - manual   Additional Comments: Per pt, she lives with spouse who can assist her "a Zaun".  Niece is also supportive but can only provide intermittent assist.  Patient is poor historian due to memory issues.      Prior Functioning/Environment Prior Level of Function : Needs assist       Physical Assist : Mobility (physical);ADLs (physical)     Mobility Comments: Pt using RW at SNF for mobility ADLs Comments: Pt reports she can complete basic ADL's independently, suspect she requires more assist now at SNF        OT Problem List: Decreased strength;Decreased activity tolerance;Impaired balance (sitting and/or standing);Decreased coordination;Decreased safety awareness      OT Treatment/Interventions: Self-care/ADL training;Therapeutic exercise;Energy conservation;DME and/or AE instruction;Therapeutic activities;Patient/family education;Balance training    OT Goals(Current goals can be found in the care plan section) Acute Rehab OT Goals Patient Stated Goal: To go home OT Goal Formulation: With patient Time For Goal Achievement: 02/12/21 Potential to Achieve Goals: Good  OT Frequency: Min 2X/week   Barriers to D/C:            Co-evaluation PT/OT/SLP Co-Evaluation/Treatment: Yes Reason for Co-Treatment: To address functional/ADL transfers;Other (comment) (Imminent Discharge Orders)   OT goals addressed during session: ADL's and self-care;Proper use of Adaptive equipment and DME;Strengthening/ROM      AM-PAC OT "6 Clicks" Daily Activity     Outcome Measure Help from another person eating meals?: A Lindy Help from another person taking care of personal grooming?: A Mcfarren Help from another person toileting, which includes using toliet, bedpan, or urinal?: A Lot Help from  another  person bathing (including washing, rinsing, drying)?: A Lot Help from another person to put on and taking off regular upper body clothing?: A Shamoon Help from another person to put on and taking off regular lower body clothing?: A Lot 6 Click Score: 15   End of Session Equipment Utilized During Treatment: Gait belt;Rolling walker (2 wheels) Nurse Communication: Mobility status  Activity Tolerance: Patient tolerated treatment well Patient left: in bed;with call bell/phone within reach  OT Visit Diagnosis: Unsteadiness on feet (R26.81);Other abnormalities of gait and mobility (R26.89);Muscle weakness (generalized) (M62.81)                Time: 5681-2751 OT Time Calculation (min): 23 min Charges:  OT General Charges $OT Visit: 1 Visit OT Evaluation $OT Eval Moderate Complexity: 1 Mod  Roch Quach H., OTR/L Acute Rehabilitation  Yuya Vanwingerden Elane Yolanda Bonine 01/29/2021, 11:34 AM

## 2021-01-30 DIAGNOSIS — E860 Dehydration: Secondary | ICD-10-CM | POA: Diagnosis not present

## 2021-01-30 DIAGNOSIS — R4182 Altered mental status, unspecified: Secondary | ICD-10-CM | POA: Diagnosis not present

## 2021-01-30 DIAGNOSIS — G9341 Metabolic encephalopathy: Secondary | ICD-10-CM | POA: Diagnosis not present

## 2021-01-30 DIAGNOSIS — N179 Acute kidney failure, unspecified: Secondary | ICD-10-CM | POA: Diagnosis not present

## 2021-01-30 LAB — CBC
HCT: 25 % — ABNORMAL LOW (ref 36.0–46.0)
HCT: 27 % — ABNORMAL LOW (ref 36.0–46.0)
Hemoglobin: 8.3 g/dL — ABNORMAL LOW (ref 12.0–15.0)
Hemoglobin: 9.2 g/dL — ABNORMAL LOW (ref 12.0–15.0)
MCH: 31 pg (ref 26.0–34.0)
MCH: 31.5 pg (ref 26.0–34.0)
MCHC: 33.2 g/dL (ref 30.0–36.0)
MCHC: 34.1 g/dL (ref 30.0–36.0)
MCV: 93.3 fL (ref 80.0–100.0)
MCV: 92.5 fL (ref 80.0–100.0)
Platelets: 111 10*3/uL — ABNORMAL LOW (ref 150–400)
Platelets: 125 10*3/uL — ABNORMAL LOW (ref 150–400)
RBC: 2.68 MIL/uL — ABNORMAL LOW (ref 3.87–5.11)
RBC: 2.92 MIL/uL — ABNORMAL LOW (ref 3.87–5.11)
RDW: 15.1 % (ref 11.5–15.5)
RDW: 15.2 % (ref 11.5–15.5)
WBC: 5.7 10*3/uL (ref 4.0–10.5)
WBC: 5.8 10*3/uL (ref 4.0–10.5)
nRBC: 0 % (ref 0.0–0.2)
nRBC: 0 % (ref 0.0–0.2)

## 2021-01-30 LAB — BASIC METABOLIC PANEL
Anion gap: 6 (ref 5–15)
BUN: 20 mg/dL (ref 8–23)
CO2: 24 mmol/L (ref 22–32)
Calcium: 8 mg/dL — ABNORMAL LOW (ref 8.9–10.3)
Chloride: 105 mmol/L (ref 98–111)
Creatinine, Ser: 1.52 mg/dL — ABNORMAL HIGH (ref 0.44–1.00)
GFR, Estimated: 34 mL/min — ABNORMAL LOW (ref >60)
Glucose, Bld: 340 mg/dL — ABNORMAL HIGH (ref 70–99)
Potassium: 3.7 mmol/L (ref 3.5–5.1)
Sodium: 135 mmol/L (ref 135–145)

## 2021-01-30 LAB — GLUCOSE, CAPILLARY
Glucose-Capillary: 295 mg/dL — ABNORMAL HIGH (ref 70–99)
Glucose-Capillary: 179 mg/dL — ABNORMAL HIGH (ref 70–99)
Glucose-Capillary: 269 mg/dL — ABNORMAL HIGH (ref 70–99)
Glucose-Capillary: 212 mg/dL — ABNORMAL HIGH (ref 70–99)

## 2021-01-30 LAB — LEVETIRACETAM LEVEL: Levetiracetam Lvl: 15.5 ug/mL (ref 10.0–40.0)

## 2021-01-30 MED ORDER — HEPARIN SOD (PORK) LOCK FLUSH 100 UNIT/ML IV SOLN
500.0000 [IU] | INTRAVENOUS | Status: AC | PRN
  Administered 2021-01-30: 500 [IU]
  Filled 2021-01-30: qty 5

## 2021-01-30 MED ORDER — TORSEMIDE 20 MG PO TABS: 20.0000 mg | ORAL_TABLET | ORAL | 5 refills | Status: DC | PRN

## 2021-01-30 MED ORDER — POLYETHYLENE GLYCOL 3350 17 G PO PACK
17.0000 g | PACK | Freq: Every day | ORAL | Status: DC
  Administered 2021-01-30: 17 g via ORAL
  Filled 2021-01-30: qty 1

## 2021-01-30 MED ORDER — SENNOSIDES-DOCUSATE SODIUM 8.6-50 MG PO TABS
2.0000 | ORAL_TABLET | Freq: Two times a day (BID) | ORAL | Status: DC
  Administered 2021-01-30 (×2): 2 via ORAL
  Filled 2021-01-30 (×2): qty 2

## 2021-01-30 NOTE — Care Management Obs Status (Signed)
[facility] NOTIFICATION   Patient Details  Name: [name] MRN: [id number] Date of Birth: 12/18/1940   Medicare Observation Status Notification Given:  Yes    Tom-Johnson, Renea Ee, RN 01/30/2021, 10:28 AM

## 2021-01-30 NOTE — TOC Progression Note (Signed)
Transition of Care [facility]) - Initial/Assessment Note    Patient Details  Name: [name] MRN: [id number] Date of Birth: [birth year]  [facility] CM/SW Contact:    Milinda Antis, [place] Phone Number: 01/30/2021, 12:39 PM  Clinical Narrative:                 CSW spoke with Helene Kelp at [facility] and was informed that the patient is ST at their facility and if she returns today, insurance auth will not be needed. After today insurance auth will need to be requested.  Attendings notified.        Patient Goals and CMS Choice        Expected Discharge Plan and Services                                                Prior Living Arrangements/Services                       Activities of Daily Living      Permission Sought/Granted                  Emotional Assessment              Admission diagnosis:  Dehydration [E86.0] AKI (acute kidney injury) ([place]) [Q67.6] Acute metabolic encephalopathy [P95.09] Patient Active Problem List   Diagnosis Date Noted   AKI (acute kidney injury) ([place]) 01/28/2021   GI bleed 01/18/2021   Bullous lesion 10/08/2020   Hearing loss 05/29/2020   Neuropathy 07/27/2019   Hyperkalemia 10/18/2018   Port-A-Cath in place 05/23/2018   Iron deficiency anemia due to chronic blood loss 04/19/2018   Acute right hip pain 03/10/2018   Hyperpigmented skin lesion 02/07/2018   Leg weakness, bilateral 12/23/2017   Hematemesis 12/04/2017   Pain localized to upper abdomen 10/05/2017   Lower extremity pain, bilateral 07/15/2017   Pulmonary HTN ([place]) 04/01/2017   Heart murmur 04/01/2017   Overgrown toenails 03/18/2017   Osteopenia after menopause 01/13/2017   Epistaxis 07/30/2016   Venous insufficiency of both lower extremities 04/27/2016   Anemia due to chronic blood loss    Dry skin 02/17/2016   Peripheral vascular disease ([place]) 12/30/2015   Pain in joint of right shoulder 10/22/2015   PAF (paroxysmal atrial  fibrillation) ([place]) 32/67/1245   Lichen planus 80/99/8338   Healthcare maintenance 06/20/2015   Seborrheic keratosis 06/20/2015   Memory loss 01/30/2015   Simple febrile convulsions ([place]) 10/21/2014   Hyperammonemia ([place]) 10/03/2014   CKD (chronic kidney disease), stage III ([name]    Seizures ([place]) 09/2014   Acute kidney injury ([place]) 08/27/2014   Chronic diastolic CHF (congestive heart failure) ([place]) 10/03/2013   Acute on chronic heart failure with preserved ejection fraction (HFpEF) ([place]) 10/03/2013   Type 2 diabetes mellitus without complication ([place]) 25/06/3974   HTN (hypertension), benign 03/02/2012   Fatigue 07/10/2011   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 [place] [address] Phone: [phone] Fax: [phone]     Social Determinants of Health (SDOH) Interventions    Readmission Risk Interventions No flowsheet data found.

## 2021-01-30 NOTE — Progress Notes (Signed)
Report called to SNF

## 2021-01-30 NOTE — Discharge Summary (Signed)
Name: [name] MRN: [id number] DOB: [birth year] 80 y.o. PCP: Mike Craze, DO  Date of Admission: 01/28/2021  6:29 PM Date of Discharge: 01/30/2021 Attending Physician: Aldine Contes, MD  Discharge Diagnosis: 1. AKI on CKD 2/2 dehydration 2. Hypernatremia 3. Hypokalemia 4. Altered Mental Status  Discharge Medications: Allergies as of 01/30/2021       Reactions   Aspirin Nausea And Vomiting        Medication List     TAKE these medications    Accu-Chek Guide test strip Generic drug: glucose blood USE AS INSTRUCTED TO CHECK BLOOD SUGAR 3 TIMES DAILY. What changed: See the new instructions.   Accu-Chek Softclix Lancets lancets 1 each by Other route 3 (three) times daily. Use as instructed to check blood sugar 3 times per day dx code E11.65   amLODipine 2.5 MG tablet Commonly known as: NORVASC Take 1 tablet (2.5 mg total) by mouth every morning.   diclofenac Sodium 1 % Gel Commonly known as: VOLTAREN Apply 2 g topically 4 (four) times daily. What changed:  when to take this reasons to take this   FreeStyle Libre 2 Sensor Misc 2 Devices by Does not apply route every 14 (fourteen) days.   gabapentin 100 MG capsule Commonly known as: NEURONTIN TAKE 1 CAPSULE BY MOUTH TWICE DAILY   HumaLOG Mix 75/25 KwikPen (75-25) 100 UNIT/ML Kwikpen Generic drug: Insulin Lispro Prot & Lispro INJECT 10 UNITS SUBCUTANEOUSLY ONCE DAILY What changed: See the new instructions.   IMMUNE FORMULA PO Take 1 tablet by mouth daily.   insulin lispro 100 UNIT/ML KwikPen Commonly known as: HumaLOG KwikPen 3 units before dinner What changed:  how much to take how to take this when to take this additional instructions   levETIRAcetam 500 MG tablet Commonly known as: KEPPRA TAKE 1 TABLET BY MOUTH TWICE DAILY   memantine 10 MG tablet Commonly known as: NAMENDA TAKE 1 TABLET BY MOUTH TWICE DAILY   Multi For Her 50+ Tabs Take 1 tablet by mouth daily with  breakfast.   pantoprazole 40 MG tablet Commonly known as: PROTONIX Take 1 tablet (40 mg total) by mouth 2 (two) times daily.   potassium chloride SA 20 MEQ tablet Commonly known as: KLOR-CON M Take 20 mEq by mouth 2 (two) times daily.   torsemide 20 MG tablet Commonly known as: DEMADEX Take 1 tablet (20 mg total) by mouth as needed (Please take as needed for for lower extremity swelling OR weight gain >2-3lbs per day or >5lbs per week). What changed:  when to take this reasons to take this   triamcinolone ointment 0.5 % Commonly known as: KENALOG APPLY 1 APPLICATION TOPICALLY TO RASH TWICE DAILY FOR ITCHING What changed: See the new instructions.   Trulicity 1.5 XN/2.3FT Sopn Generic drug: Dulaglutide INJECT 1.5MG  SUBCUTANEOUSLY EVERY WEEK ON THURSDAY What changed: See the new instructions.        Disposition and follow-up:   [name] was discharged from [facility] in Stable condition.  At the hospital follow up visit please address:  AKI on CKD 2/2 dehydration: Fluid resuscitation.  Switch torsemide to as needed dosing given this is patient's second admission for dehydration causing AKI.  Please obtain BMP for kidney function.  Hypernatremia/Hypokalemia: Resolved.  Please obtain BMP to verify electrolytes continue to stay within normal range.  Labs / imaging needed at time of follow-up: None  Pending labs/ test needing follow-up: Levetiracetam level  Follow-up Appointments: Discharge to [facility]  Course by Problem List:  [name] is a 80 y.o. with pertinent PMH of hereditary hemorrhagic telangiectasias, recent hospitalization with severe GI bleeding from a gastric ulcer s/p clipping, hx of epilepsy, Insulin dependent type 2 DM , CKD stage 3a, HTN, HLD and dementia who presented with altered mental status and admitted for acute kidney injury.   #AKI on CKD, resolved #Hypernatremia, resolved #Hypokalemia, resolved Creatinine at  2.25 with GFR of 22 and BUN elevated at 40, baseline creatinine 1.4, baseline eGFR 35-40.  Likely prerenal AKI secondary to dehydration from poor p.o. intake.  Patient also on torsemide which may have contributed.  On admission sodium 146, sodium initially increased to 149, that improved following switch from normal saline to dextrose 5. Normal saline in the ED likely contributed to patient's increasing hypernatremia.  Hypernatremia and hypokalemia have resolved on day of discharge.  Patient's kidney function returning to baseline.  Patient has returned to her mental status baseline and is having appropriate p.o. intake and urine output. PT/OT recommended SNF. Patient has bed available to return to SNF today. We will switch torsemide to as needed dosing at discharge given this is patient's second admission for AKI secondary to dehydration, and torsemide may be contributing.   #Altered Mental Status #Hx Seizures #MCI Patient presented with confusion, not able to identify family members. Brain imaging negative for acute process.  Lower concern for seizure activity as cause of AMS given history and lab findings, Keppra can also cause sedation/confusion, checking Keppra level, Levetiracetam level pending.  More likely AMS secondary to AKI from dehydration.  Patient has history of mild cognitive impairment and reported history of dementia, is high risk for hospital induced delirium.  Patient oriented to self, month and day, location, knows she came from rehab facility.  Patient was continued on memantine 10 mg as well as Keppra 500 mg twice daily.  Appears to be back at mental status baseline.  Family had questions concerning dementia staging, recommended patient follow-up with PCP for cognitive evaluation to trend scores.    #Hx Hereditary hemorrhagic telangectasia syndrome #Hx of UGIB s/p clipping #Iron deficiency anemia On admission, hemoglobin 11.5, consistent with mild normocytic anemia, stable from prior  hospitalization.  Hemoglobin did decrease to 9.5 in the setting of fluid resuscitation for AKI.  No concern for acute blood loss at this time.  Hemoglobin remained stable this admission.   #Type 2 diabetes with #Diabetic neuropathy Last A1c 7.8%. Home medications include Humalog 10 units before breakfast, Admelog 10 units before each meal, Trulicity 1.5 mg once weekly, and gabapentin 100 mg twice daily.  Managed with sliding scale this admission.  Had elevated CBGs in the setting of D5 fluid resuscitation.  Anticipate blood glucose will normalize with cessation of D5 and resuming home medications at discharge.   #HFpEF Last echo done in August 2022 with EF of 65 to 70%.  Patient on torsemide 20 mg daily at home.  Holding torsemide in the setting of AKI and dehydration.  We will switch torsemide to as needed dosing at discharge.   #GERD Continued on home medication of Protonix 40 mg   #Hypertension Home medications include amlodipine 2.5 mg. Continued home amlodipine 2.5 mg daily.   Subjective on day of discharge: Patient feels well this morning.  Reports she still not eating a lot but was able to eat some dinner and breakfast.  Denies nausea, abdominal pain, lightheadedness, dizziness.  Would like to leave hospital today.  Discussed return to rehab facility to  get stronger.  Discharge Exam:   BP (!) 136/50 (BP Location: Right Arm)    Pulse (!) 57    Temp 98.1 F (36.7 C)    Resp 18    LMP  (LMP Unknown)    SpO2 100%  Discharge exam: General: Well appearing elderly African-American female, NAD HENT: normocephalic, atraumatic EYES: conjunctiva non-erythematous, no scleral icterus CV: regular rate, normal rhythm, no murmurs, rubs, gallops.  Trace peripheral LE edema bilaterally. Pulmonary: normal work of breathing on RA, lungs clear to auscultation, no rales, wheezes, rhonchi Abdominal: non-distended, soft, non-tender to palpation, normal BS Skin: Warm and dry, no rashes or  lesions Neurological: MS: awake, alert and oriented x3, normal speech and fund of knowledge Motor: moves all extremities antigravity Psych: normal affect   Pertinent Labs, Studies, and Procedures:  CBC Latest Ref Rng & Units 01/30/2021 01/30/2021 01/29/2021  WBC 4.0 - 10.5 K/uL 5.8 5.7 5.5  Hemoglobin 12.0 - 15.0 g/dL 8.1(B) 8.3(L) 9.5(L)  Hematocrit 36.0 - 46.0 % 27.0(L) 25.0(L) 28.8(L)  Platelets 150 - 400 K/uL 125(L) 111(L) 114(L)   CMP Latest Ref Rng & Units 01/30/2021 01/29/2021 01/29/2021  Glucose 70 - 99 mg/dL 638(A) 365(K) 038(O)  BUN 8 - 23 mg/dL 20 78(R) 28(K)  Creatinine 0.44 - 1.00 mg/dL 0.43(X) 8.26(Z) 6.90(V)  Sodium 135 - 145 mmol/L 135 143 147(H)  Potassium 3.5 - 5.1 mmol/L 3.7 3.4(L) 3.6  Chloride 98 - 111 mmol/L 105 112(H) 113(H)  CO2 22 - 32 mmol/L 24 25 25   Calcium 8.9 - 10.3 mg/dL 8.0(L) 8.1(L) 8.7(L)  Total Protein 6.5 - 8.1 g/dL - - -  Total Bilirubin 0.3 - 1.2 mg/dL - - -  Alkaline Phos 38 - 126 U/L - - -  AST 15 - 41 U/L - - -  ALT 0 - 44 U/L - - -    CT Head Wo Contrast  Result Date: 01/28/2021 CLINICAL DATA:  Mental status change, unknown cause EXAM: CT HEAD WITHOUT CONTRAST TECHNIQUE: Contiguous axial images were obtained from the base of the skull through the vertex without intravenous contrast. COMPARISON:  None. BRAIN: BRAIN Cerebral ventricle sizes are concordant with the degree of cerebral volume loss. Patchy and confluent areas of decreased attenuation are noted throughout the deep and periventricular white matter of the cerebral hemispheres bilaterally, compatible with chronic microvascular ischemic disease. No evidence of large-territorial acute infarction. No parenchymal hemorrhage. No mass lesion. No extra-axial collection. No mass effect or midline shift. No hydrocephalus. Basilar cisterns are patent. Vascular: No hyperdense vessel. Atherosclerotic calcifications are present within the cavernous internal carotid arteries. Skull: No acute  fracture or focal lesion. Sinuses/Orbits: Paranasal sinuses and mastoid air cells are clear. The orbits are unremarkable. Other: None. IMPRESSION: No acute intracranial abnormality. Electronically Signed   By: 01/30/2021 M.D.   On: 01/28/2021 19:28   MR BRAIN WO CONTRAST  Result Date: 01/29/2021 CLINICAL DATA:  80 year old female with altered mental status. Recent upper GI bleed and renal insufficiency. EXAM: MRI HEAD WITHOUT CONTRAST TECHNIQUE: Multiplanar, multiecho pulse sequences of the brain and surrounding structures were obtained without intravenous contrast. COMPARISON:  Brain MRI 01/12/2021, 08/28/2014.  Head CT 01/28/2021. FINDINGS: Brain: No restricted diffusion to suggest acute infarction. No midline shift, mass effect, evidence of mass lesion, ventriculomegaly, extra-axial collection or acute intracranial hemorrhage. Cervicomedullary junction and pituitary are within normal limits. Widespread, confluent bilateral cerebral white matter T2 and FLAIR hyperintensity appears only mildly progressed since 2016 and symmetric. No cortical encephalomalacia or definite chronic cerebral  blood products identified. Regression of intrinsic T1 hyperintensity in the globus pallidus since the 2016 exam. Now the deep gray matter nuclei, brainstem and cerebellum are within normal limits for age. Vascular: Major intracranial vascular flow voids are stable. Skull and upper cervical spine: Partially visible cervical spine disc and endplate degeneration. Visualized bone marrow signal is within normal limits. Sinuses/Orbits: Negative. Other: Mastoids remain clear. Grossly normal visible internal auditory structures. Negative visible-scalp and face. IMPRESSION: 1. No acute intracranial abnormality. 2. Stable noncontrast brain MRI from earlier this month. Chronically advanced cerebral white matter disease, only mildly progressed since a 2016 MRI. Electronically Signed   By: Genevie Ann M.D.   On: 01/29/2021 05:09   DG  Chest Portable 1 View  Result Date: 01/28/2021 CLINICAL DATA:  Altered mental status EXAM: PORTABLE CHEST 1 VIEW COMPARISON:  01/09/2021 FINDINGS: Lungs are clear. No pneumothorax or pleural effusion. Right internal jugular chest port tip noted within the superior cavoatrial junction. Cardiac size within normal limits. Pulmonary vascularity is normal. No acute bone abnormality. IMPRESSION: No active disease. Electronically Signed   By: Fidela Salisbury M.D.   On: 01/28/2021 20:20    Discharge Instructions:   Portions of this report may have been transcribed using voice recognition software. Every effort was made to ensure accuracy; however, inadvertent computerized transcription errors may be present.   Wayland Denis, MD 01/30/21,  1:10 PM Pager: [phone] Internal Medicine Resident, PGY-1 [name]

## 2021-01-30 NOTE — TOC Transition Note (Signed)
Transition of Care [facility]) - CM/SW Discharge Note   Patient Details  Name: [name] MRN: [id number] Date of Birth: 1940/07/07  [facility]) CM/SW Contact:  Milinda Antis, [place] Phone Number: 01/30/2021, 2:08 PM   Clinical Narrative:    Patient will DC to: [facility] Anticipated DC date: 01/30/2021 Family notified: Yes Transport by: Corey Harold   Per MD patient ready for DC to SNF . RN to call report prior to discharge [phone] room 105. RN, patient, patient's family, and facility notified of DC. Discharge Summary and FL2 sent to facility. DC packet on chart. Ambulance transport requested for patient.   CSW will sign off for now as social work intervention is no longer needed. Please consult [place] again if new needs arise.           Patient Goals and CMS Choice        Discharge Placement                       Discharge Plan and Services                                     Social Determinants of Health (SDOH) Interventions     Readmission Risk Interventions No flowsheet data found.

## 2021-01-31 NOTE — Progress Notes (Signed)
Called and notified daughter Huey Romans patient discharged and on her way to SNF Erling Conte, RN

## 2021-02-03 ENCOUNTER — Emergency Department (HOSPITAL_COMMUNITY): Payer: Medicare Other

## 2021-02-03 ENCOUNTER — Encounter (HOSPITAL_COMMUNITY): Payer: Self-pay | Admitting: Emergency Medicine

## 2021-02-03 ENCOUNTER — Inpatient Hospital Stay (HOSPITAL_COMMUNITY)
Admission: EM | Admit: 2021-02-03 | Discharge: 2021-02-12 | DRG: 987 | Disposition: A | Discharge status: Skilled Nursing Facility | Payer: Medicare Other | Source: Skilled Nursing Facility | Attending: Internal Medicine | Admitting: Internal Medicine

## 2021-02-03 DIAGNOSIS — Z79899 Other long term (current) drug therapy: Secondary | ICD-10-CM

## 2021-02-03 DIAGNOSIS — G40909 Epilepsy, unspecified, not intractable, without status epilepticus: Secondary | ICD-10-CM | POA: Diagnosis present

## 2021-02-03 DIAGNOSIS — Z20822 Contact with and (suspected) exposure to covid-19: Secondary | ICD-10-CM | POA: Diagnosis present

## 2021-02-03 DIAGNOSIS — R578 Other shock: Secondary | ICD-10-CM | POA: Diagnosis not present

## 2021-02-03 DIAGNOSIS — N1831 Chronic kidney disease, stage 3a: Secondary | ICD-10-CM | POA: Diagnosis present

## 2021-02-03 DIAGNOSIS — Z803 Family history of malignant neoplasm of breast: Secondary | ICD-10-CM

## 2021-02-03 DIAGNOSIS — E861 Hypovolemia: Secondary | ICD-10-CM | POA: Diagnosis present

## 2021-02-03 DIAGNOSIS — E1165 Type 2 diabetes mellitus with hyperglycemia: Secondary | ICD-10-CM | POA: Diagnosis not present

## 2021-02-03 DIAGNOSIS — E872 Acidosis, unspecified: Secondary | ICD-10-CM | POA: Diagnosis present

## 2021-02-03 DIAGNOSIS — I780 Hereditary hemorrhagic telangiectasia: Secondary | ICD-10-CM | POA: Diagnosis present

## 2021-02-03 DIAGNOSIS — E11649 Type 2 diabetes mellitus with hypoglycemia without coma: Secondary | ICD-10-CM | POA: Diagnosis not present

## 2021-02-03 DIAGNOSIS — F0390 Unspecified dementia without behavioral disturbance: Secondary | ICD-10-CM | POA: Diagnosis present

## 2021-02-03 DIAGNOSIS — Z8719 Personal history of other diseases of the digestive system: Secondary | ICD-10-CM | POA: Diagnosis present

## 2021-02-03 DIAGNOSIS — K760 Fatty (change of) liver, not elsewhere classified: Secondary | ICD-10-CM | POA: Diagnosis present

## 2021-02-03 DIAGNOSIS — N179 Acute kidney failure, unspecified: Secondary | ICD-10-CM | POA: Diagnosis present

## 2021-02-03 DIAGNOSIS — K254 Chronic or unspecified gastric ulcer with hemorrhage: Secondary | ICD-10-CM | POA: Diagnosis not present

## 2021-02-03 DIAGNOSIS — Z8701 Personal history of pneumonia (recurrent): Secondary | ICD-10-CM

## 2021-02-03 DIAGNOSIS — D1809 Hemangioma of other sites: Secondary | ICD-10-CM | POA: Diagnosis present

## 2021-02-03 DIAGNOSIS — Z823 Family history of stroke: Secondary | ICD-10-CM

## 2021-02-03 DIAGNOSIS — R4182 Altered mental status, unspecified: Secondary | ICD-10-CM

## 2021-02-03 DIAGNOSIS — E1122 Type 2 diabetes mellitus with diabetic chronic kidney disease: Secondary | ICD-10-CM | POA: Diagnosis present

## 2021-02-03 DIAGNOSIS — I5032 Chronic diastolic (congestive) heart failure: Secondary | ICD-10-CM | POA: Diagnosis present

## 2021-02-03 DIAGNOSIS — E860 Dehydration: Secondary | ICD-10-CM | POA: Diagnosis present

## 2021-02-03 DIAGNOSIS — E1142 Type 2 diabetes mellitus with diabetic polyneuropathy: Secondary | ICD-10-CM | POA: Diagnosis present

## 2021-02-03 DIAGNOSIS — K3189 Other diseases of stomach and duodenum: Secondary | ICD-10-CM | POA: Diagnosis present

## 2021-02-03 DIAGNOSIS — D509 Iron deficiency anemia, unspecified: Secondary | ICD-10-CM | POA: Diagnosis present

## 2021-02-03 DIAGNOSIS — E7220 Disorder of urea cycle metabolism, unspecified: Principal | ICD-10-CM

## 2021-02-03 DIAGNOSIS — G9341 Metabolic encephalopathy: Secondary | ICD-10-CM | POA: Diagnosis present

## 2021-02-03 DIAGNOSIS — Z87891 Personal history of nicotine dependence: Secondary | ICD-10-CM

## 2021-02-03 DIAGNOSIS — J181 Lobar pneumonia, unspecified organism: Secondary | ICD-10-CM | POA: Diagnosis not present

## 2021-02-03 DIAGNOSIS — I3139 Other pericardial effusion (noninflammatory): Secondary | ICD-10-CM | POA: Diagnosis present

## 2021-02-03 DIAGNOSIS — Z833 Family history of diabetes mellitus: Secondary | ICD-10-CM

## 2021-02-03 DIAGNOSIS — E785 Hyperlipidemia, unspecified: Secondary | ICD-10-CM | POA: Diagnosis present

## 2021-02-03 DIAGNOSIS — Z8249 Family history of ischemic heart disease and other diseases of the circulatory system: Secondary | ICD-10-CM

## 2021-02-03 DIAGNOSIS — Z794 Long term (current) use of insulin: Secondary | ICD-10-CM

## 2021-02-03 DIAGNOSIS — T80818A Extravasation of other vesicant agent, initial encounter: Secondary | ICD-10-CM | POA: Diagnosis not present

## 2021-02-03 DIAGNOSIS — X58XXXA Exposure to other specified factors, initial encounter: Secondary | ICD-10-CM | POA: Diagnosis not present

## 2021-02-03 DIAGNOSIS — K922 Gastrointestinal hemorrhage, unspecified: Secondary | ICD-10-CM | POA: Diagnosis present

## 2021-02-03 DIAGNOSIS — Z886 Allergy status to analgesic agent status: Secondary | ICD-10-CM

## 2021-02-03 DIAGNOSIS — E8809 Other disorders of plasma-protein metabolism, not elsewhere classified: Secondary | ICD-10-CM | POA: Diagnosis present

## 2021-02-03 DIAGNOSIS — D62 Acute posthemorrhagic anemia: Secondary | ICD-10-CM | POA: Diagnosis not present

## 2021-02-03 DIAGNOSIS — I272 Pulmonary hypertension, unspecified: Secondary | ICD-10-CM | POA: Diagnosis present

## 2021-02-03 DIAGNOSIS — Z7985 Long-term (current) use of injectable non-insulin antidiabetic drugs: Secondary | ICD-10-CM

## 2021-02-03 DIAGNOSIS — I130 Hypertensive heart and chronic kidney disease with heart failure and stage 1 through stage 4 chronic kidney disease, or unspecified chronic kidney disease: Secondary | ICD-10-CM | POA: Diagnosis present

## 2021-02-03 DIAGNOSIS — K219 Gastro-esophageal reflux disease without esophagitis: Secondary | ICD-10-CM | POA: Diagnosis present

## 2021-02-03 LAB — RESP PANEL BY RT-PCR (FLU A&B, COVID) ARPGX2
Influenza A by PCR: NEGATIVE
Influenza B by PCR: NEGATIVE
SARS Coronavirus 2 by RT PCR: NEGATIVE

## 2021-02-03 LAB — I-STAT VENOUS BLOOD GAS, ED
Acid-base deficit: 3 mmol/L — ABNORMAL HIGH (ref 0.0–2.0)
Bicarbonate: 21.6 mmol/L (ref 20.0–28.0)
Calcium, Ion: 1.28 mmol/L (ref 1.15–1.40)
HCT: 29 % — ABNORMAL LOW (ref 36.0–46.0)
Hemoglobin: 9.9 g/dL — ABNORMAL LOW (ref 12.0–15.0)
O2 Saturation: 99 %
Potassium: 5 mmol/L (ref 3.5–5.1)
Sodium: 146 mmol/L — ABNORMAL HIGH (ref 135–145)
TCO2: 23 mmol/L (ref 22–32)
pCO2, Ven: 36 mmHg — ABNORMAL LOW (ref 44.0–60.0)
pH, Ven: 7.387 (ref 7.250–7.430)
pO2, Ven: 134 mmHg — ABNORMAL HIGH (ref 32.0–45.0)

## 2021-02-03 LAB — CBC WITH DIFFERENTIAL/PLATELET
Abs Immature Granulocytes: 0.03 10*3/uL (ref 0.00–0.07)
Basophils Absolute: 0 10*3/uL (ref 0.0–0.1)
Basophils Relative: 1 %
Eosinophils Absolute: 0.3 10*3/uL (ref 0.0–0.5)
Eosinophils Relative: 4 %
HCT: 32.1 % — ABNORMAL LOW (ref 36.0–46.0)
Hemoglobin: 10.3 g/dL — ABNORMAL LOW (ref 12.0–15.0)
Immature Granulocytes: 0 %
Lymphocytes Relative: 10 %
Lymphs Abs: 0.7 10*3/uL (ref 0.7–4.0)
MCH: 31 pg (ref 26.0–34.0)
MCHC: 32.1 g/dL (ref 30.0–36.0)
MCV: 96.7 fL (ref 80.0–100.0)
Monocytes Absolute: 0.6 10*3/uL (ref 0.1–1.0)
Monocytes Relative: 9 %
Neutro Abs: 5.3 10*3/uL (ref 1.7–7.7)
Neutrophils Relative %: 76 %
Platelets: 167 10*3/uL (ref 150–400)
RBC: 3.32 MIL/uL — ABNORMAL LOW (ref 3.87–5.11)
RDW: 16.1 % — ABNORMAL HIGH (ref 11.5–15.5)
WBC: 7 10*3/uL (ref 4.0–10.5)
nRBC: 0 % (ref 0.0–0.2)

## 2021-02-03 LAB — CBG MONITORING, ED
Glucose-Capillary: 144 mg/dL — ABNORMAL HIGH (ref 70–99)
Glucose-Capillary: 139 mg/dL — ABNORMAL HIGH (ref 70–99)

## 2021-02-03 LAB — COMPREHENSIVE METABOLIC PANEL
ALT: 19 U/L (ref 0–44)
AST: 30 U/L (ref 15–41)
Albumin: 2.9 g/dL — ABNORMAL LOW (ref 3.5–5.0)
Alkaline Phosphatase: 102 U/L (ref 38–126)
Anion gap: 8 (ref 5–15)
BUN: 18 mg/dL (ref 8–23)
CO2: 21 mmol/L — ABNORMAL LOW (ref 22–32)
Calcium: 9.4 mg/dL (ref 8.9–10.3)
Chloride: 115 mmol/L — ABNORMAL HIGH (ref 98–111)
Creatinine, Ser: 1.64 mg/dL — ABNORMAL HIGH (ref 0.44–1.00)
GFR, Estimated: 31 mL/min — ABNORMAL LOW (ref >60)
Glucose, Bld: 185 mg/dL — ABNORMAL HIGH (ref 70–99)
Potassium: 5.1 mmol/L (ref 3.5–5.1)
Sodium: 144 mmol/L (ref 135–145)
Total Bilirubin: 0.6 mg/dL (ref 0.3–1.2)
Total Protein: 5.9 g/dL — ABNORMAL LOW (ref 6.5–8.1)

## 2021-02-03 LAB — LACTIC ACID, PLASMA: Lactic Acid, Venous: 2.7 mmol/L (ref 0.5–1.9)

## 2021-02-03 LAB — URINALYSIS, ROUTINE W REFLEX MICROSCOPIC
Bilirubin Urine: NEGATIVE
Glucose, UA: NEGATIVE mg/dL
Hgb urine dipstick: NEGATIVE
Ketones, ur: NEGATIVE mg/dL
Leukocytes,Ua: NEGATIVE
Nitrite: NEGATIVE
Protein, ur: NEGATIVE mg/dL
Specific Gravity, Urine: 1.01 (ref 1.005–1.030)
pH: 7 (ref 5.0–8.0)

## 2021-02-03 LAB — AMMONIA: Ammonia: 188 umol/L — ABNORMAL HIGH (ref 9–35)

## 2021-02-03 LAB — ETHANOL: Alcohol, Ethyl (B): <10 mg/dL (ref <10)

## 2021-02-03 MED ORDER — SODIUM CHLORIDE 0.9 % IV SOLN
1.0000 g | Freq: Once | INTRAVENOUS | Status: AC
  Administered 2021-02-03: 16:00:00 1 g via INTRAVENOUS
  Filled 2021-02-03 (×2): qty 10

## 2021-02-03 MED ORDER — INSULIN ASPART 100 UNIT/ML IJ SOLN
0.0000 [IU] | Freq: Three times a day (TID) | INTRAMUSCULAR | Status: DC
  Administered 2021-02-03: 16:00:00 2 [IU] via SUBCUTANEOUS
  Administered 2021-02-05: 13:00:00 15 [IU] via SUBCUTANEOUS

## 2021-02-03 MED ORDER — LACTATED RINGERS IV SOLN: INTRAVENOUS | Status: DC

## 2021-02-03 MED ORDER — POLYETHYLENE GLYCOL 3350 17 G PO PACK
17.0000 g | PACK | Freq: Every day | ORAL | Status: DC | PRN
  Filled 2021-02-03: qty 1

## 2021-02-03 MED ORDER — SODIUM CHLORIDE 0.9 % IV SOLN
500.0000 mg | Freq: Once | INTRAVENOUS | Status: AC
  Administered 2021-02-03: 13:00:00 500 mg via INTRAVENOUS
  Filled 2021-02-03: qty 5

## 2021-02-03 MED ORDER — ACETAMINOPHEN 650 MG RE SUPP: 650.0000 mg | Freq: Four times a day (QID) | RECTAL | Status: DC | PRN

## 2021-02-03 MED ORDER — LACTATED RINGERS IV SOLN: INTRAVENOUS | Status: AC

## 2021-02-03 MED ORDER — LACTULOSE ENEMA
300.0000 mL | Freq: Once | ORAL | Status: AC
  Administered 2021-02-03: 300 mL via RECTAL
  Filled 2021-02-03: qty 300

## 2021-02-03 MED ORDER — ONDANSETRON HCL 4 MG PO TABS: 4.0000 mg | ORAL_TABLET | Freq: Four times a day (QID) | ORAL | Status: DC | PRN

## 2021-02-03 MED ORDER — ENOXAPARIN SODIUM 30 MG/0.3ML IJ SOSY
30.0000 mg | PREFILLED_SYRINGE | INTRAMUSCULAR | Status: DC
  Administered 2021-02-03 – 2021-02-04 (×2): 30 mg via SUBCUTANEOUS
  Filled 2021-02-03 (×2): qty 0.3

## 2021-02-03 MED ORDER — LACTATED RINGERS IV BOLUS
1000.0000 mL | Freq: Once | INTRAVENOUS | Status: AC
  Administered 2021-02-03: 11:00:00 1000 mL via INTRAVENOUS

## 2021-02-03 MED ORDER — INSULIN ASPART 100 UNIT/ML IJ SOLN
0.0000 [IU] | Freq: Every day | INTRAMUSCULAR | Status: DC
  Administered 2021-02-04: 2 [IU] via SUBCUTANEOUS

## 2021-02-03 MED ORDER — PANTOPRAZOLE SODIUM 40 MG PO TBEC
40.0000 mg | DELAYED_RELEASE_TABLET | Freq: Every day | ORAL | Status: DC
  Administered 2021-02-04: 40 mg via ORAL
  Filled 2021-02-03: qty 1

## 2021-02-03 MED ORDER — LEVETIRACETAM IN NACL 500 MG/100ML IV SOLN
500.0000 mg | Freq: Two times a day (BID) | INTRAVENOUS | Status: DC
  Administered 2021-02-03 – 2021-02-04 (×2): 500 mg via INTRAVENOUS
  Filled 2021-02-03 (×4): qty 100

## 2021-02-03 MED ORDER — ONDANSETRON HCL 4 MG/2ML IJ SOLN
4.0000 mg | Freq: Four times a day (QID) | INTRAMUSCULAR | Status: DC | PRN
  Administered 2021-02-04 – 2021-02-11 (×3): 4 mg via INTRAVENOUS
  Filled 2021-02-03 (×3): qty 2

## 2021-02-03 MED ORDER — ACETAMINOPHEN 325 MG PO TABS
650.0000 mg | ORAL_TABLET | Freq: Four times a day (QID) | ORAL | Status: DC | PRN
  Administered 2021-02-10 – 2021-02-12 (×5): 650 mg via ORAL
  Filled 2021-02-03 (×5): qty 2

## 2021-02-03 NOTE — ED Notes (Signed)
Doctor at bedside.

## 2021-02-03 NOTE — ED Notes (Signed)
Daughter at bedside. States her mom has "episodes" more frequently and this is one of them. Daughter spoke with patient and states "What" only. These episodes could be due to possibly dehydration or dementia.

## 2021-02-03 NOTE — ED Provider Notes (Signed)
[facility] DEPARTMENT Provider Note   CSN: [id number] Arrival date & time: 02/03/21  0915     History Chief Complaint  Patient presents with   Altered Mental Status    [name] is a 80 y.o. female.  80 yo F with a chief complaints of altered mental status.  Was noted by the nursing facility.  They felt she was normal at about 1500 last night.  No other specific symptom concerns per the nursing facility no cough congestion fever nausea vomiting or diarrhea.  I tried to feed her this morning and were unable to and realized that she was less responsive than normal and so called 911.  Level 5 caveat altered mental status.   Altered Mental Status     Past Medical History:  Diagnosis Date   Chronic anemia    Chronic diastolic CHF (congestive heart failure) ([place]) 10/03/2013   Chronic GI bleeding    /notes 11/29/2014   Family history of anesthesia complication    "niece has a hard time coming out" (09/15/2012)   Frequent nosebleeds    chronic   Gastric AV malformation    /notes 11/29/2014   GERD (gastroesophageal reflux disease)    Heart murmur 04/01/2017   Moderate AVSC on echo 09/2016   History of blood transfusion "several"   History of epistaxis    HTN (hypertension), benign 03/02/2012   Hyperlipidemia    Iron deficiency anemia    chronic infusions"   Lichen planus    Both lower extremities   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    Patient Active Problem List   Diagnosis Date Noted   Altered mental status 02/03/2021   AKI (acute kidney injury) ([place]) 01/28/2021   GI bleed 01/18/2021   Bullous lesion 10/08/2020   Hearing loss 05/29/2020    Neuropathy 07/27/2019   Hyperkalemia 10/18/2018   Port-A-Cath in place 05/23/2018   Iron deficiency anemia due to chronic blood loss 04/19/2018   Acute right hip pain 03/10/2018   Hyperpigmented skin lesion 02/07/2018   Leg weakness, bilateral 12/23/2017   Hematemesis 12/04/2017   Pain localized to upper abdomen 10/05/2017   Lower extremity pain, bilateral 07/15/2017   Pulmonary HTN ([place]) 04/01/2017   Heart murmur 04/01/2017   Overgrown toenails 03/18/2017   Osteopenia after menopause 01/13/2017   Epistaxis 07/30/2016   Venous insufficiency of both lower extremities 04/27/2016   Anemia due to chronic blood loss    Dry skin 02/17/2016   Peripheral vascular disease ([place]) 12/30/2015   Pain in joint of right shoulder 10/22/2015   PAF (paroxysmal atrial fibrillation) ([place]) 50/04/7046   Lichen planus 88/91/6945   Healthcare maintenance 06/20/2015   Seborrheic keratosis 06/20/2015   Memory loss 01/30/2015   Simple febrile convulsions ([place]) 10/21/2014   Hyperammonemia ([place]) 10/03/2014   CKD (chronic kidney disease), stage III ([name]    Seizures ([place]) 09/2014   Acute kidney injury ([place]) 08/27/2014   Chronic diastolic CHF (congestive heart failure) ([place]) 10/03/2013   Acute on chronic heart failure with preserved ejection fraction (HFpEF) ([place]) 10/03/2013   Type 2 diabetes mellitus without complication ([place]) 03/88/8280  HTN (hypertension), benign 03/02/2012   Fatigue 07/10/2011   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    Past Surgical History:  Procedure Laterality Date   CATARACT EXTRACTION     "I think it was just one eye"   ESOPHAGOGASTRODUODENOSCOPY  02/26/2011   Procedure: ESOPHAGOGASTRODUODENOSCOPY (EGD);  Surgeon: [name], MD;  Location: [name];  Service: Endoscopy;  Laterality: N/A;   ESOPHAGOGASTRODUODENOSCOPY N/A 11/08/2012   Procedure: ESOPHAGOGASTRODUODENOSCOPY (EGD);  Surgeon: [name], MD;  Location: [name]  ENDOSCOPY;  Service: Endoscopy;  Laterality: N/A;   ESOPHAGOGASTRODUODENOSCOPY N/A 10/04/2013   Procedure: ESOPHAGOGASTRODUODENOSCOPY (EGD);  Surgeon: Winfield Cunas., MD;  Location: [name];  Service: Endoscopy;  Laterality: N/A;  with APC on stand-by   ESOPHAGOGASTRODUODENOSCOPY N/A 07/06/2014   Procedure: ESOPHAGOGASTRODUODENOSCOPY (EGD);  Surgeon: Clarene Essex, MD;  Location: [name];  Service: Endoscopy;  Laterality: N/A;   ESOPHAGOGASTRODUODENOSCOPY N/A 09/05/2014   Procedure: ESOPHAGOGASTRODUODENOSCOPY (EGD);  Surgeon: Laurence Spates, MD;  Location: [name];  Service: Endoscopy;  Laterality: N/A;  APC on standby to control bleeding   ESOPHAGOGASTRODUODENOSCOPY N/A 11/29/2014   Procedure: ESOPHAGOGASTRODUODENOSCOPY (EGD);  Surgeon: [name], MD;  Location: [facility] ENDOSCOPY;  Service: Endoscopy;  Laterality: N/A;   ESOPHAGOGASTRODUODENOSCOPY N/A 09/28/2015   Procedure: ESOPHAGOGASTRODUODENOSCOPY (EGD);  Surgeon: Clarene Essex, MD;  Location: [facility] ENDOSCOPY;  Service: Endoscopy;  Laterality: N/A;   ESOPHAGOGASTRODUODENOSCOPY (EGD) WITH PROPOFOL N/A 12/04/2017   Procedure: ESOPHAGOGASTRODUODENOSCOPY (EGD) WITH PROPOFOL;  Surgeon: [name], MD;  Location: [facility];  Service: Endoscopy;  Laterality: N/A;   ESOPHAGOGASTRODUODENOSCOPY (EGD) WITH PROPOFOL N/A 12/24/2020   Procedure: ESOPHAGOGASTRODUODENOSCOPY (EGD) WITH PROPOFOL;  Surgeon: Arta Silence, MD;  Location: [facility];  Service: Endoscopy;  Laterality: N/A;   ESOPHAGOGASTRODUODENOSCOPY (EGD) WITH PROPOFOL N/A 01/19/2021   Procedure: ESOPHAGOGASTRODUODENOSCOPY (EGD) WITH PROPOFOL;  Surgeon: [name], MD;  Location: [facility];  Service: Endoscopy;  Laterality: N/A;   ESOPHAGOGASTRODUODENOSCOPY ENDOSCOPY  08/19/2006   with laser treatment   HEMOSTASIS CLIP PLACEMENT  12/24/2020   Procedure: HEMOSTASIS CLIP PLACEMENT;  Surgeon: Arta Silence, MD;  Location: [facility] ENDOSCOPY;  Service: Endoscopy;;    HEMOSTASIS CLIP PLACEMENT  01/19/2021   Procedure: HEMOSTASIS CLIP PLACEMENT;  Surgeon: [name], MD;  Location: [facility];  Service: Endoscopy;;   HOT HEMOSTASIS N/A 07/06/2014   Procedure: HOT HEMOSTASIS (ARGON PLASMA COAGULATION/BICAP);  Surgeon: Clarene Essex, MD;  Location: [name];  Service: Endoscopy;  Laterality: N/A;   HOT HEMOSTASIS N/A 09/28/2015   Procedure: HOT HEMOSTASIS (ARGON PLASMA COAGULATION/BICAP);  Surgeon: Clarene Essex, MD;  Location: [facility] ENDOSCOPY;  Service: Endoscopy;  Laterality: N/A;   HOT HEMOSTASIS N/A 12/04/2017   Procedure: HOT HEMOSTASIS (ARGON PLASMA COAGULATION/BICAP);  Surgeon: [name], MD;  Location: [facility];  Service: Endoscopy;  Laterality: N/A;   NASAL HEMORRHAGE CONTROL     "for bleeding"    SAVORY DILATION  02/26/2011   Procedure: SAVORY DILATION;  Surgeon: [name], MD;  Location: [name];  Service: Endoscopy;  Laterality: N/A;  c-arm needed   SCLEROTHERAPY  12/24/2020   Procedure: SCLEROTHERAPY;  Surgeon: Arta Silence, MD;  Location: [facility] ENDOSCOPY;  Service: Endoscopy;;   SCLEROTHERAPY  01/19/2021   Procedure: Clide Deutscher;  Surgeon: [name], MD;  Location: [facility] ENDOSCOPY;  Service: Endoscopy;;   SUBMUCOSAL INJECTION  12/04/2017   Procedure: SUBMUCOSAL INJECTION;  Surgeon: [name], MD;  Location: [facility] ENDOSCOPY;  Service: Endoscopy;;     OB History   No obstetric history on file.  Family History  Problem Relation Age of Onset   Healthy Mother    Stroke Father    Diabetes Niece    Breast cancer Other    Malignant hyperthermia Neg Hx    Seizures Neg Hx     Social History   Tobacco Use   Smoking status: Former    Packs/day: 1.00    Years: 20.00    Pack years: 20.00    Types: Cigarettes    Quit date: 02/10/1971    Years since quitting: 50.0   Smokeless tobacco: Never   Tobacco comments:    09/15/2012 "smoked 50-60 yr ago"  Vaping Use   Vaping Use: Never used  Substance Use Topics    Alcohol use: No    Alcohol/week: 0.0 standard drinks   Drug use: No    Home Medications Prior to Admission medications   Medication Sig Start Date End Date Taking? Authorizing Provider  ACCU-CHEK GUIDE test strip USE AS INSTRUCTED TO CHECK BLOOD SUGAR 3 TIMES DAILY. Patient taking differently: 1 each by Other route in the morning, at noon, and at bedtime. 08/08/20  Yes Elayne Snare, MD  Accu-Chek Softclix Lancets lancets 1 each by Other route 3 (three) times daily. Use as instructed to check blood sugar 3 times per day dx code E11.65 02/01/20  Yes Elayne Snare, MD  amLODipine (NORVASC) 2.5 MG tablet Take 1 tablet (2.5 mg total) by mouth every morning. 01/15/21  Yes Eugenie Filler, MD  Continuous Blood Gluc Sensor (FREESTYLE LIBRE 2 SENSOR) MISC 2 Devices by Does not apply route every 14 (fourteen) days. 04/25/20  Yes Elayne Snare, MD  diclofenac Sodium (VOLTAREN) 1 % GEL Apply 2 g topically 4 (four) times daily. Patient taking differently: Apply 2 g topically 4 (four) times daily as needed (pain). 12/26/20  Yes Lacinda Axon, MD  gabapentin (NEURONTIN) 100 MG capsule TAKE 1 CAPSULE BY MOUTH TWICE DAILY Patient taking differently: Take 100 mg by mouth 2 (two) times daily. 08/16/20  Yes Katsadouros, Vasilios, MD  HUMALOG MIX 75/25 KWIKPEN (75-25) 100 UNIT/ML Kwikpen INJECT 10 UNITS SUBCUTANEOUSLY ONCE DAILY Patient taking differently: Inject 12 Units into the skin every morning. 04/17/20  Yes Elayne Snare, MD  insulin lispro (HUMALOG KWIKPEN) 100 UNIT/ML KwikPen 3 units before dinner Patient taking differently: Inject 2 Units into the skin 3 (three) times daily. 10/03/20  Yes Elayne Snare, MD  insulin lispro (HUMALOG) 100 UNIT/ML injection Inject 3-10 Units into the skin 2 (two) times daily. 3 units once a day for diabetes and 10 units before dinner.   Yes [provider]  levETIRAcetam (KEPPRA) 500 MG tablet TAKE 1 TABLET BY MOUTH TWICE DAILY Patient taking differently: Take 500 mg by  mouth 2 (two) times daily. 06/07/20  Yes [name], Areeg N, DO  memantine (NAMENDA) 10 MG tablet TAKE 1 TABLET BY MOUTH TWICE DAILY Patient taking differently: Take 10 mg by mouth 2 (two) times daily. 11/11/20  Yes [name], Areeg N, DO  Misc Natural Products (IMMUNE FORMULA PO) Take 1 tablet by mouth daily.   Yes [provider]  Multiple Vitamins-Minerals (MULTI FOR HER 50+) TABS Take 1 tablet by mouth daily with breakfast.   Yes [provider]  pantoprazole (PROTONIX) 40 MG tablet Take 1 tablet (40 mg total) by mouth 2 (two) times daily. 12/26/20  Yes Lacinda Axon, MD  potassium chloride SA (KLOR-CON M) 20 MEQ tablet Take 20 mEq by mouth 2 (two) times daily.   Yes [provider]  torsemide (DEMADEX) 20 MG tablet Take 1 tablet (20 mg total) by mouth as needed (Please take as needed for for lower extremity swelling OR weight gain >2-3lbs per day or >5lbs per week). 01/30/21  Yes Wayland Denis, MD  triamcinolone cream (KENALOG) 0.1 % Apply 1 application topically 2 (two) times daily.   Yes [provider]  TRULICITY 1.5 NT/7.0YF SOPN INJECT 1.5MG  SUBCUTANEOUSLY EVERY WEEK ON THURSDAY Patient taking differently: Inject 1.5 mg into the skin every Thursday. 07/29/20  Yes Elayne Snare, MD  triamcinolone ointment (KENALOG) 0.5 % APPLY 1 APPLICATION TOPICALLY TO RASH TWICE DAILY FOR ITCHING Patient not taking: Reported on 02/03/2021 08/16/20   Riesa Pope, MD    Allergies    Aspirin  Review of Systems   Review of Systems  Unable to perform ROS: Mental status change   Physical Exam Updated Vital Signs BP (!) 162/68    Pulse 78    Temp (!) 97.3 F (36.3 C) (Rectal)    Resp 19    Ht 5\' 3"  (1.6 m)    Wt 59 kg    LMP  (LMP Unknown)    SpO2 100%    BMI 23.04 kg/m   Physical Exam Vitals and nursing note reviewed.  Constitutional:      General: She is not in acute distress.    Appearance: She is well-developed. She is not diaphoretic.  HENT:     Head:  Normocephalic and atraumatic.  Eyes:     Pupils: Pupils are equal, round, and reactive to light.  Cardiovascular:     Rate and Rhythm: Normal rate and regular rhythm.     Heart sounds: No murmur heard.   No friction rub. No gallop.  Pulmonary:     Effort: Pulmonary effort is normal.     Breath sounds: No wheezing or rales.  Abdominal:     General: There is no distension.     Palpations: Abdomen is soft.     Tenderness: There is no abdominal tenderness.  Musculoskeletal:        General: No tenderness.     Cervical back: Normal range of motion and neck supple.  Skin:    General: Skin is warm and dry.  Neurological:     Comments: Localizes to pain.  No appreciable movement with the left upper extremity.  Psychiatric:        Behavior: Behavior normal.    ED Results / Procedures / Treatments   Labs (all labs ordered are listed, but only abnormal results are displayed) Labs Reviewed  AMMONIA - Abnormal; Notable for the following components:      Result Value   Ammonia 188 (*)    All other components within normal limits  COMPREHENSIVE METABOLIC PANEL - Abnormal; Notable for the following components:   Chloride 115 (*)    CO2 21 (*)    Glucose, Bld 185 (*)    Creatinine, Ser 1.64 (*)    Total Protein 5.9 (*)    Albumin 2.9 (*)    GFR, Estimated 31 (*)    All other components within normal limits  LACTIC ACID, PLASMA - Abnormal; Notable for the following components:   Lactic Acid, Venous 2.7 (*)    All other components within normal limits  CBC WITH DIFFERENTIAL/PLATELET - Abnormal; Notable for the following components:   RBC 3.32 (*)    Hemoglobin 10.3 (*)    HCT 32.1 (*)    RDW 16.1 (*)    All other components within normal  limits  I-STAT VENOUS BLOOD GAS, ED - Abnormal; Notable for the following components:   pCO2, Ven 36.0 (*)    pO2, Ven 134.0 (*)    Acid-base deficit 3.0 (*)    Sodium 146 (*)    HCT 29.0 (*)    Hemoglobin 9.9 (*)    All other components within  normal limits  RESP PANEL BY RT-PCR (FLU A&B, COVID) ARPGX2  ETHANOL  URINALYSIS, ROUTINE W REFLEX MICROSCOPIC  BLOOD GAS, VENOUS  CBG MONITORING, ED    EKG EKG Interpretation  Date/Time:  Monday February 03 2021 09:41:36 EST Ventricular Rate:  69 PR Interval:  169 QRS Duration: 81 QT Interval:  398 QTC Calculation: 427 R Axis:   18 Text Interpretation: Sinus rhythm Left ventricular hypertrophy Minimal ST elevation, inferior leads No significant change since last tracing Confirmed by Deno Etienne [phone]) on 02/03/2021 10:50:53 AM  Radiology CT HEAD WO CONTRAST  Result Date: 02/03/2021 CLINICAL DATA:  Altered mental status EXAM: CT HEAD WITHOUT CONTRAST TECHNIQUE: Contiguous axial images were obtained from the base of the skull through the vertex without intravenous contrast. COMPARISON:  01/28/2021 FINDINGS: Brain: No evidence of acute infarction, hemorrhage, hydrocephalus, extra-axial collection or mass lesion/mass effect. Extensive periventricular and deep white matter hypodensity. Vascular: No hyperdense vessel or unexpected calcification. Skull: Normal. Negative for fracture or focal lesion. Sinuses/Orbits: No acute finding. Other: None. IMPRESSION: No acute intracranial pathology. Advanced small-vessel white matter disease. Electronically Signed   By: Delanna Ahmadi M.D.   On: 02/03/2021 09:59   DG Chest Portable 1 View  Result Date: 02/03/2021 CLINICAL DATA:  Altered mental status, drooling LEFT-side, CHF, GERD, hypertension, type II diabetes mellitus EXAM: PORTABLE CHEST 1 VIEW COMPARISON:  Portable exam 1046 hours compared to 01/28/2021 FINDINGS: RIGHT jugular Port-A-Cath with tip projecting over SVC near cavoatrial junction. Enlargement of cardiac silhouette. Mediastinal contours and pulmonary vascularity normal. Atherosclerotic calcification aorta. Vague opacity LEFT upper lobe question infiltrate. Remaining lungs clear. No pleural effusion or pneumothorax. Bones demineralized.  IMPRESSION: Enlargement of cardiac silhouette with questionable LEFT upper lobe infiltrate. Aortic Atherosclerosis (ICD10-I70.0). Electronically Signed   By: Lavonia Dana M.D.   On: 02/03/2021 11:14    Procedures Procedures   Medications Ordered in ED Medications  cefTRIAXone (ROCEPHIN) 1 g in sodium chloride 0.9 % 100 mL IVPB (has no administration in time range)  azithromycin (ZITHROMAX) 500 mg in sodium chloride 0.9 % 250 mL IVPB (500 mg Intravenous New Bag/Given 02/03/21 1245)  lactulose (CHRONULAC) enema 200 gm (has no administration in time range)  enoxaparin (LOVENOX) injection 30 mg (has no administration in time range)  polyethylene glycol (MIRALAX / GLYCOLAX) packet 17 g (has no administration in time range)  ondansetron (ZOFRAN) tablet 4 mg (has no administration in time range)    Or  ondansetron (ZOFRAN) injection 4 mg (has no administration in time range)  acetaminophen (TYLENOL) tablet 650 mg (has no administration in time range)    Or  acetaminophen (TYLENOL) suppository 650 mg (has no administration in time range)  lactated ringers bolus 1,000 mL (1,000 mLs Intravenous New Bag/Given 02/03/21 1030)    ED Course  I have reviewed the triage vital signs and the nursing notes.  Pertinent labs & imaging results that were available during my care of the patient were reviewed by me and considered in my medical decision making (see chart for details).    MDM Rules/Calculators/A&P  80 yo F with a chief complaint of altered mental status.  Unfortunately this is the patient's third visit for altered mental status in a couple months.  She was admitted for a GI bleed and then was admitted for an AKI.  We will obtain a laboratory evaluation and CT had blood work.  Reassess.  No significant change in mental status.  Discussed with internal medicine team will come and evaluate.  The patients results and plan were reviewed and discussed.   Any x-rays performed  were independently reviewed by myself.   Differential diagnosis were considered with the presenting HPI.  Medications  cefTRIAXone (ROCEPHIN) 1 g in sodium chloride 0.9 % 100 mL IVPB (has no administration in time range)  azithromycin (ZITHROMAX) 500 mg in sodium chloride 0.9 % 250 mL IVPB (500 mg Intravenous New Bag/Given 02/03/21 1245)  lactulose (CHRONULAC) enema 200 gm (has no administration in time range)  enoxaparin (LOVENOX) injection 30 mg (has no administration in time range)  polyethylene glycol (MIRALAX / GLYCOLAX) packet 17 g (has no administration in time range)  ondansetron (ZOFRAN) tablet 4 mg (has no administration in time range)    Or  ondansetron (ZOFRAN) injection 4 mg (has no administration in time range)  acetaminophen (TYLENOL) tablet 650 mg (has no administration in time range)    Or  acetaminophen (TYLENOL) suppository 650 mg (has no administration in time range)  lactated ringers bolus 1,000 mL (1,000 mLs Intravenous New Bag/Given 02/03/21 1030)    Vitals:   02/03/21 1145 02/03/21 1200 02/03/21 1230 02/03/21 1245  BP: (!) 151/73 (!) 161/64 (!) 161/66 (!) 162/68  Pulse: 82 78 77 78  Resp: 13 16 13 19   Temp:    (!) 97.3 F (36.3 C)  TempSrc:    Rectal  SpO2: 100% 100% 100% 100%  Weight:      Height:        Final diagnoses:  Hyperammonemia ([name]  Altered mental status, unspecified altered mental status type    Admission/ observation were discussed with the admitting physician, patient and/or family and they are comfortable with the plan.      Final Clinical Impression(s) / ED Diagnoses Final diagnoses:  Hyperammonemia ([place])  Altered mental status, unspecified altered mental status type    Rx / DC Orders ED Discharge Orders     None        Deno Etienne, DO 02/03/21 1315

## 2021-02-03 NOTE — ED Notes (Signed)
Spoke with Dr Tyrone Nine no antibiotics ordered at this time.

## 2021-02-03 NOTE — ED Notes (Signed)
Pt cleaned and new bedding, brief, purewick and sheets applied

## 2021-02-03 NOTE — ED Triage Notes (Signed)
Arrived via EMS SNF for AMS. LKW 02/02/21 at 1500. Nonverbal , unable to follow commands, drooling left side. Per staff EMS reported patient Ax3 and ambulatory. Staff attempted to feed patient breakfast and was not able to. Airway intact.

## 2021-02-03 NOTE — ED Notes (Signed)
LAb called Critical lab Lactic acid 2.7 reported to Dr Tyrone Nine.

## 2021-02-03 NOTE — ED Notes (Signed)
Pt's daughter updated.

## 2021-02-03 NOTE — Hospital Course (Addendum)
[name] is a 80 y.o. with pertinent PMH of hereditary hemorrhagic telangiectasias, recent hospitalization with severe GI bleeding from a gastric ulcer s/p clipping, hx of epilepsy, Insulin dependent type 2 DM , CKD stage 3a, HTN, HLD and dementia who presented with altered mental status   #Hyperammonemia #Pronounced echogenicity in right hepatic lobe Patient has presented previously been hospitalized for AMS in the setting of AKI or hyperammonemia.  Ammonia level significantly elevated this admission 188.  LFTs normal.  Given multiple hospitalizations for similar findings, we did pursue RUQ US imaging to evaluate for liver disease.  Imaging showed pronounced echogenicity in the right hepatic lobe concerning for liver disease versus vascular anomaly.  Radiology recommended further imaging via MRI/CT which showed no evidence of metastatic disease.     #Altered mental status, resolved #Reported Hx of dementia Patient presented with altered mental status, was noted to not be eating or responding to questions at SNF, only responding to painful stimuli. Concern for infection as cause of AMS.  Lactate of 2.7 and CXR with questionable opacity, however, there was no leukocytosis, no fever, and unremarkable UA. Overall, infection seemed unlikely on admission, the patient did receive single doses of azithromycin and ceftriaxone in the ED. Serum chemistries were unremarkable for a metabolic cause aside from small AKI.  CTH negative and no focal neurologic deficits concerning for acute CVA. Ammonia was significantly elevated at 188 but the clinical significance of this is unclear, further work-up was initiated. Patient likely hypovolemic given her lactic acidosis and mild AKI. She received 1L IVF in the ED.  Patient received maintenance IVF with resolution of AKI.  Patient's mental status also improved following IVF, suggesting AKI, dehydration and poor p.o. fluid intake was significantly contributing to AMS. Back  to mental status baseline.  Memantine 10 mg restarted this admission.  Through the remaining duration of the admission, patient did not have recurrence of encephalopathy.   #Hx Hereditary hemorrhagic telangectasia syndrome #Hx of UGIB s/p clipping #Iron deficiency anemia #GERD On admission, hemoglobin 10.3, consistent with mild normocytic anemia, stable since prior hospitalization.  Protonix 40 mg twice daily was continued this admission.  Overnight 12/28 the patient experience new gastric hemorrhage as evidenced by large volume hematemesis with large blood clots, which was also noted on CT abdomen/pelvis which showed contrast leaking within the distal stomach adjacent to a prior hemostasis clip.  She underwent emergent IR embolization, during which procedure the left gastric artery was embolized.  She also received 2 units PRBC and fluids with appropriate response in hemoglobin and hemodynamic stability.  On 12/29 she underwent EGD which showed several nonbleeding gastric ulcers.  She had no further signs of acute bleeding and remained hemodynamically stable with stable hemoglobin levels.***   #AKI on CKD 3a, resolved Cr admission 1.6, baseline ~1.4.  Likely prerenal AKI secondary to dehydration from decreased p.o. intake.  Patient was treated with IV fluid resuscitation with improvement in creatinine to 1.2.  Kidney function back to baseline.  Held home potassium supplement, potassium appropriate this admission.  Unfortunately after recurrent gastric hemorrhage, AKI recurred secondary to hypovolemia.  This gradually resolved to baseline with fluid resuscitation and control of gastric bleed.   #Hx epilepsy Patient was continued on home Keppra.     #Type 2 diabetes with insulin dependence #Diabetic neuropathy Last A1c 7.8%, patient was managed with short and long-acting insulin throughout her admission.   #HFpEF Last echo done in August 2022 with EF of 65 to 70%. G2DD and severe  left atrial  enlargement. Patient on torsemide 20 mg daily PRN which was held secondary to hemorrhagic shock and clinical euvolemia on resolution of shock.   #Hypertension Patient takes amlodipine 2.5 mg daily at home. Held in the setting of AMS on admission without being restarted secondary to hemorrhagic shock.   Referral to cardiology Cardiac hemachromatosis - reestablish with them And hematology May need chelation therapy

## 2021-02-03 NOTE — H&P (Addendum)
Date: 02/03/2021               Patient Name:  [name] MRN: [id number]  DOB: 1940-09-22 Age / Sex: 80 y.o., female   PCP: Mike Craze, DO         Medical Service: Internal Medicine Teaching Service         Attending Physician: Dr. Velna Ochs, MD    First Contact: Dr. Alvie Heidelberg Pager: [phone]  Second Contact: Dr. Allyson Sabal Pager: [phone]       After Hours (After 5p/  First Contact Pager: [phone]  weekends / holidays): Second Contact Pager: [phone]   Chief Complaint: altered mental status  History of Present Illness:  [name] is a 80 y.o. with pertinent PMH of hereditary hemorrhagic telangiectasias, recent hospitalization with severe GI bleeding from a gastric ulcer s/p clipping, hx of epilepsy, Insulin dependent type 2 DM , CKD stage 3a, HTN, HLD and dementia who presented with altered mental status.   Per chart review and ED provider's report, the patient resides at a SNF [facility]) and was sent to the hospital due to not being able to eat breakfast. On assessment the patient is unresponsive to voice and does not follow verbal commands, but she is responsive to mild painful stimulus. She yawns occasionally and jerks away when pupils are checked.   The patient's daughter is found at bedside and provides some history. She reports that the patient has episodes of unresponsiveness that have been becoming more frequent. She reports that IV fluids seem to help each time. She feels the patient's ammonia or kidney function is the cause. She reports that when the patient is awake and alert she eats and drinks normally. This was observed during the patient's last hospitalization, in which the patient was alert and able to communicate freely.   ED course: workup notable for a lactate of 2.7, CXR with questionable opacity, no leukocytosis, no fever, unremarkable UA, negative CTH, ammonia of 188, cr of 1.6 (baseline of ~1.4), and hgb of 10.3 (baseline ~9-11).  Meds:   Amlodipine 2.5 mg daily Voltaren QID PRN Gabapentin 100 mg BID Humalog 75/25 12U qmorning  Humalog 2U TID Trulicity qThursday Keppra 500 mg BID Memantine 10 mg BID Protonix 40 mg BID Torsemide 20 mg as needed for weight gain or LE swelling Kcl 20 meq BID  Allergies: Allergies as of 02/03/2021 - Review Complete 02/03/2021  Allergen Reaction Noted   Aspirin Nausea And Vomiting 05/12/2006   Past Medical History:  Diagnosis Date   Chronic anemia    Chronic diastolic CHF (congestive heart failure) ([place]) 10/03/2013   Chronic GI bleeding    /notes 11/29/2014   Family history of anesthesia complication    "niece has a hard time coming out" (09/15/2012)   Frequent nosebleeds    chronic   Gastric AV malformation    /notes 11/29/2014   GERD (gastroesophageal reflux disease)    Heart murmur 04/01/2017   Moderate AVSC on echo 09/2016   History of blood transfusion "several"   History of epistaxis    HTN (hypertension), benign 03/02/2012   Hyperlipidemia    Iron deficiency anemia    chronic infusions"   Lichen planus    Both lower extremities   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    Family History: dementia in many family members, HHT in sister and others who are now deceased.   Social History:  Recently at [facility] [place] place Daughter denies alcohol, tobacco, or illegal drug use  Review of Systems: Unable to be completed due to patient's mental status  Physical Exam: Blood pressure (!) 158/76, pulse 86, temperature (!) 97.3 F (36.3 C), temperature source Rectal, resp. rate 18, height 5\' 3"  (1.6 m), weight 59 kg, SpO2 98 %. Physical Exam Vitals reviewed.  Cardiovascular:      Rate and Rhythm: Normal rate and regular rhythm.     Pulses: Normal pulses.     Heart sounds: No murmur heard. Pulmonary:     Effort: Pulmonary effort is normal.     Breath sounds: Normal breath sounds.  Abdominal:     General: Bowel sounds are normal.     Palpations: Abdomen is soft.     Tenderness: There is no abdominal tenderness.  Skin:    General: Skin is warm and dry.  Neurological:     Mental Status: She is alert.     Comments: Unresponsive to voice and does not follow verbal commands, but she is responsive to mild painful stimulus. She yawns occasionally and jerks away when pupils are checked. Pupils are PERRL.      EKG: personally reviewed my interpretation is sinus rhythm  CXR: personally reviewed my interpretation is questionable LLL opacity  Assessment & Plan by Problem: [name] is a 80 y.o. with pertinent PMH of hereditary hemorrhagic telangiectasias, recent hospitalization with severe GI bleeding from a gastric ulcer s/p clipping, hx of epilepsy, Insulin dependent type 2 DM , CKD stage 3a, HTN, HLD and dementia who presented with altered mental status.  #Altered mental status #R/o infection #Reported Hx of dementia Lactate of 2.7 and CXR with questionable opacity, however, there is no leukocytosis, no fever, unremarkable UA. Overall, infection seems unlikely. Serum chemistries unremarkable for a metabolic cause. Structural causes are much less likely given negative CTH. Ammonia is significantly elevated at 188 but the value of this is questionable without any signs of liver disease. Ethanol is negative and I doubt other forms of intoxication. Patient may be somewhat hypovolemic given her lactic acidosis, but her renal function is close to baseline, indicating that she is probably not very hypovolemic. She is s/p 1L IVF today. Will plan to give more given no PO intake. With other causes of AMS ruled out, this may be a disturbance of underlying dementia.  -IVF 100  mL/hr for 5 hrs, cautious use given HF history -Continue home memantine when patient's mental status allows -Follow mental status closely -Delirium precautions -No need for ABX  #Hx Hereditary hemorrhagic telangectasia syndrome #Hx of UGIB s/p clipping #Iron deficiency anemia On admission, hemoglobin 10.3, consistent with mild normocytic anemia, stable from prior hospitalization. No concern for ABLA at this time.  #CKD 3a Cr of 1.6, baseline around 1.4. Chronic and stable.   #Hx epilepsy -Continue home Keppra as IV -Consider EEG if patient does not improve   #Type 2 diabetes with insulin dependence #Diabetic neuropathy Last A1c 7.8%, home medications as above -SSI -QHS correction -CBG monitoring   #HFpEF Last echo done in August 2022 with EF of 65 to 70%. G2DD and severe left atrial enlargement. Patient on torsemide 20 mg daily  PRN.  Holding torsemide in the setting of likely dehydration. Will give 500 mL tonight.     #GERD -Continue home Protonix 40 mg when patient's mental status allows   #Hypertension -Continue home amlodipine when patient's mental status allows  Dispo: Admit patient to Observation with expected length of stay less than 2 midnights.  Signed: Corky Sox, MD PGY-1 Pager: [phone] After 5pm on weekdays and 1pm on weekends: On Call pager: [phone]

## 2021-02-04 ENCOUNTER — Encounter (HOSPITAL_COMMUNITY): Payer: Self-pay | Admitting: Internal Medicine

## 2021-02-04 ENCOUNTER — Observation Stay (HOSPITAL_COMMUNITY): Payer: Medicare Other

## 2021-02-04 ENCOUNTER — Other Ambulatory Visit: Payer: Self-pay

## 2021-02-04 ENCOUNTER — Inpatient Hospital Stay (HOSPITAL_COMMUNITY): Payer: Medicare Other

## 2021-02-04 DIAGNOSIS — E11649 Type 2 diabetes mellitus with hypoglycemia without coma: Secondary | ICD-10-CM | POA: Diagnosis not present

## 2021-02-04 DIAGNOSIS — R4182 Altered mental status, unspecified: Secondary | ICD-10-CM | POA: Diagnosis not present

## 2021-02-04 DIAGNOSIS — R410 Disorientation, unspecified: Secondary | ICD-10-CM | POA: Diagnosis not present

## 2021-02-04 DIAGNOSIS — G40909 Epilepsy, unspecified, not intractable, without status epilepticus: Secondary | ICD-10-CM | POA: Diagnosis present

## 2021-02-04 DIAGNOSIS — Z515 Encounter for palliative care: Secondary | ICD-10-CM | POA: Diagnosis not present

## 2021-02-04 DIAGNOSIS — E7220 Disorder of urea cycle metabolism, unspecified: Secondary | ICD-10-CM | POA: Diagnosis present

## 2021-02-04 DIAGNOSIS — I130 Hypertensive heart and chronic kidney disease with heart failure and stage 1 through stage 4 chronic kidney disease, or unspecified chronic kidney disease: Secondary | ICD-10-CM | POA: Diagnosis present

## 2021-02-04 DIAGNOSIS — D62 Acute posthemorrhagic anemia: Secondary | ICD-10-CM | POA: Diagnosis not present

## 2021-02-04 DIAGNOSIS — N1831 Chronic kidney disease, stage 3a: Secondary | ICD-10-CM | POA: Diagnosis present

## 2021-02-04 DIAGNOSIS — E1142 Type 2 diabetes mellitus with diabetic polyneuropathy: Secondary | ICD-10-CM | POA: Diagnosis present

## 2021-02-04 DIAGNOSIS — I272 Pulmonary hypertension, unspecified: Secondary | ICD-10-CM | POA: Diagnosis present

## 2021-02-04 DIAGNOSIS — R578 Other shock: Secondary | ICD-10-CM | POA: Diagnosis not present

## 2021-02-04 DIAGNOSIS — J181 Lobar pneumonia, unspecified organism: Secondary | ICD-10-CM | POA: Diagnosis not present

## 2021-02-04 DIAGNOSIS — E872 Acidosis, unspecified: Secondary | ICD-10-CM | POA: Diagnosis present

## 2021-02-04 DIAGNOSIS — I5032 Chronic diastolic (congestive) heart failure: Secondary | ICD-10-CM | POA: Diagnosis present

## 2021-02-04 DIAGNOSIS — F0390 Unspecified dementia without behavioral disturbance: Secondary | ICD-10-CM | POA: Diagnosis present

## 2021-02-04 DIAGNOSIS — Z20822 Contact with and (suspected) exposure to covid-19: Secondary | ICD-10-CM | POA: Diagnosis present

## 2021-02-04 DIAGNOSIS — K922 Gastrointestinal hemorrhage, unspecified: Secondary | ICD-10-CM | POA: Diagnosis not present

## 2021-02-04 DIAGNOSIS — N179 Acute kidney failure, unspecified: Secondary | ICD-10-CM | POA: Diagnosis present

## 2021-02-04 DIAGNOSIS — K254 Chronic or unspecified gastric ulcer with hemorrhage: Secondary | ICD-10-CM | POA: Diagnosis not present

## 2021-02-04 DIAGNOSIS — D509 Iron deficiency anemia, unspecified: Secondary | ICD-10-CM | POA: Diagnosis present

## 2021-02-04 DIAGNOSIS — Z886 Allergy status to analgesic agent status: Secondary | ICD-10-CM | POA: Diagnosis not present

## 2021-02-04 DIAGNOSIS — Z794 Long term (current) use of insulin: Secondary | ICD-10-CM | POA: Diagnosis not present

## 2021-02-04 DIAGNOSIS — K760 Fatty (change of) liver, not elsewhere classified: Secondary | ICD-10-CM | POA: Diagnosis present

## 2021-02-04 DIAGNOSIS — Z7189 Other specified counseling: Secondary | ICD-10-CM | POA: Diagnosis not present

## 2021-02-04 DIAGNOSIS — T80818A Extravasation of other vesicant agent, initial encounter: Secondary | ICD-10-CM | POA: Diagnosis not present

## 2021-02-04 DIAGNOSIS — I3139 Other pericardial effusion (noninflammatory): Secondary | ICD-10-CM | POA: Diagnosis present

## 2021-02-04 DIAGNOSIS — X58XXXA Exposure to other specified factors, initial encounter: Secondary | ICD-10-CM | POA: Diagnosis not present

## 2021-02-04 DIAGNOSIS — G9341 Metabolic encephalopathy: Secondary | ICD-10-CM | POA: Diagnosis present

## 2021-02-04 DIAGNOSIS — I780 Hereditary hemorrhagic telangiectasia: Secondary | ICD-10-CM | POA: Diagnosis present

## 2021-02-04 LAB — PROTIME-INR
INR: 1.1 (ref 0.8–1.2)
Prothrombin Time: 13.9 seconds (ref 11.4–15.2)

## 2021-02-04 LAB — COMPREHENSIVE METABOLIC PANEL
ALT: 18 U/L (ref 0–44)
AST: 30 U/L (ref 15–41)
Albumin: 2.9 g/dL — ABNORMAL LOW (ref 3.5–5.0)
Alkaline Phosphatase: 105 U/L (ref 38–126)
Anion gap: 10 (ref 5–15)
BUN: 11 mg/dL (ref 8–23)
CO2: 19 mmol/L — ABNORMAL LOW (ref 22–32)
Calcium: 9.3 mg/dL (ref 8.9–10.3)
Chloride: 114 mmol/L — ABNORMAL HIGH (ref 98–111)
Creatinine, Ser: 1.21 mg/dL — ABNORMAL HIGH (ref 0.44–1.00)
GFR, Estimated: 45 mL/min — ABNORMAL LOW (ref >60)
Glucose, Bld: 193 mg/dL — ABNORMAL HIGH (ref 70–99)
Potassium: 3.8 mmol/L (ref 3.5–5.1)
Sodium: 143 mmol/L (ref 135–145)
Total Bilirubin: 1.2 mg/dL (ref 0.3–1.2)
Total Protein: 5.6 g/dL — ABNORMAL LOW (ref 6.5–8.1)

## 2021-02-04 LAB — CBC
HCT: 31.3 % — ABNORMAL LOW (ref 36.0–46.0)
Hemoglobin: 10.4 g/dL — ABNORMAL LOW (ref 12.0–15.0)
MCH: 31.1 pg (ref 26.0–34.0)
MCHC: 33.2 g/dL (ref 30.0–36.0)
MCV: 93.7 fL (ref 80.0–100.0)
Platelets: 166 10*3/uL (ref 150–400)
RBC: 3.34 MIL/uL — ABNORMAL LOW (ref 3.87–5.11)
RDW: 15.8 % — ABNORMAL HIGH (ref 11.5–15.5)
WBC: 6.1 10*3/uL (ref 4.0–10.5)
nRBC: 0 % (ref 0.0–0.2)

## 2021-02-04 LAB — GLUCOSE, CAPILLARY
Glucose-Capillary: 144 mg/dL — ABNORMAL HIGH (ref 70–99)
Glucose-Capillary: 194 mg/dL — ABNORMAL HIGH (ref 70–99)
Glucose-Capillary: 292 mg/dL — ABNORMAL HIGH (ref 70–99)

## 2021-02-04 MED ORDER — LEVETIRACETAM 500 MG PO TABS
500.0000 mg | ORAL_TABLET | Freq: Two times a day (BID) | ORAL | Status: DC
  Administered 2021-02-06 – 2021-02-12 (×13): 500 mg via ORAL
  Filled 2021-02-04 (×14): qty 1

## 2021-02-04 MED ORDER — LEVETIRACETAM IN NACL 500 MG/100ML IV SOLN
500.0000 mg | Freq: Two times a day (BID) | INTRAVENOUS | Status: AC
  Administered 2021-02-04: 500 mg via INTRAVENOUS
  Filled 2021-02-04: qty 100

## 2021-02-04 MED ORDER — GABAPENTIN 100 MG PO CAPS
100.0000 mg | ORAL_CAPSULE | Freq: Two times a day (BID) | ORAL | Status: DC
  Administered 2021-02-04 – 2021-02-12 (×14): 100 mg via ORAL
  Filled 2021-02-04 (×15): qty 1

## 2021-02-04 MED ORDER — PANTOPRAZOLE SODIUM 40 MG PO TBEC: 40.0000 mg | DELAYED_RELEASE_TABLET | Freq: Two times a day (BID) | ORAL | Status: DC

## 2021-02-04 MED ORDER — MEMANTINE HCL 10 MG PO TABS
10.0000 mg | ORAL_TABLET | Freq: Two times a day (BID) | ORAL | Status: DC
  Administered 2021-02-04 – 2021-02-12 (×14): 10 mg via ORAL
  Filled 2021-02-04 (×18): qty 1

## 2021-02-04 MED ORDER — AMLODIPINE BESYLATE 5 MG PO TABS: 2.5000 mg | ORAL_TABLET | Freq: Every morning | ORAL | Status: DC

## 2021-02-04 MED ORDER — LEVETIRACETAM 500 MG PO TABS: 500.0000 mg | ORAL_TABLET | Freq: Two times a day (BID) | ORAL | Status: DC

## 2021-02-04 MED ORDER — LEVETIRACETAM IN NACL 500 MG/100ML IV SOLN: 500.0000 mg | Freq: Two times a day (BID) | INTRAVENOUS | Status: DC

## 2021-02-04 MED ORDER — PANTOPRAZOLE SODIUM 40 MG IV SOLR
40.0000 mg | Freq: Two times a day (BID) | INTRAVENOUS | Status: AC
  Administered 2021-02-04 (×2): 40 mg via INTRAVENOUS
  Filled 2021-02-04 (×2): qty 40

## 2021-02-04 MED ORDER — LACTATED RINGERS IV SOLN: INTRAVENOUS | Status: AC

## 2021-02-04 NOTE — Plan of Care (Signed)
Pt admitted for AMS. Pt A&O to self. BP (!) 161/56 (BP Location: Right Arm)    Pulse 82    Temp 98.2 F (36.8 C) (Oral)    Resp 18    Ht 5\' 3"  (1.6 m)    Wt 59 kg    LMP  (LMP Unknown)    SpO2 100%    BMI 23.04 kg/m  Blood sugar of 139, no c/o pain. Pt on tele NSR. Pt oriented as best as can be to floor . Pt has no skin issues. 2n RN Lexine Baton verified. Bed in lowest position, call bell nearby, 3/4 rails up and bed alarm on. [name] 02/04/21 1:04 AM

## 2021-02-04 NOTE — TOC Initial Note (Addendum)
Transition of Care [name]) - Initial/Assessment Note    Patient Details  Name: [name] MRN: [id number] Date of Birth: 1940/07/07  Transition of Care [facility]) CM/SW Contact:    Tom-Johnson, Renea Ee, RN Phone Number: 02/04/2021, 3:32 PM  Clinical Narrative:                 Patient recently discharged from th hospital on 12/22 and went to [place]. CM notified by patient's nurse that God daughter Cassandria Santee was at bedside and requesting to speak with CM. Tabatha concerned about safe disposition for patient as she has been returning to the hospital frequently. CM notified MD and SW of concern and MD will address.     Expected Discharge Plan: [place] Barriers to Discharge: Continued Medical Work up   Patient Goals and CMS Choice Patient states their goals for this hospitalization and ongoing recovery are:: [place] to [place] CMS Medicare.gov Compare Post Acute Care list provided to:: Patient Choice offered to / list presented to : Patient  Expected Discharge Plan and Services Expected Discharge Plan: [place]   Discharge Planning Services: CM Consult   Living arrangements for the past 2 months: [place]                                      Prior Living Arrangements/Services Living arrangements for the past 2 months: [place] Lives with:: Facility Resident Patient language and need for interpreter reviewed:: Yes Do you feel safe going back to the place where you live?: Yes      Need for Family Participation in Patient Care: Yes (Comment) Care giver support system in place?: Yes (comment)   Criminal Activity/Legal Involvement Pertinent to Current Situation/Hospitalization: No - Comment as needed  Activities of Daily Living Home Assistive Devices/Equipment: None ADL Screening (condition at time of admission) Patient's cognitive ability adequate to safely complete daily activities?: Yes Is the  patient deaf or have difficulty hearing?: Yes Does the patient have difficulty seeing, even when wearing glasses/contacts?: Yes Does the patient have difficulty concentrating, remembering, or making decisions?: Yes Patient able to express need for assistance with ADLs?: Yes Does the patient have difficulty dressing or bathing?: Yes Independently performs ADLs?: No Communication: Needs assistance Dressing (OT): Needs assistance Grooming: Needs assistance Feeding: Needs assistance Bathing: Needs assistance Toileting: Needs assistance In/Out Bed: Needs assistance Walks in Home: Needs assistance Does the patient have difficulty walking or climbing stairs?: Yes Weakness of Legs: Both Weakness of Arms/Hands: Both  Permission Sought/Granted Permission sought to share information with : Case Manager, Customer service manager, Family Supports Permission granted to share information with : Yes, Verbal Permission Granted              Emotional Assessment Appearance:: Appears stated age Attitude/Demeanor/Rapport: Engaged, Gracious Affect (typically observed): Accepting, Appropriate, Calm, Hopeful Orientation: : Oriented to Self, Oriented to Place, Oriented to  Time, Oriented to Situation Alcohol / Substance Use: Not Applicable Psych Involvement: No (comment)  Admission diagnosis:  Hyperammonemia ([place]) [E72.20] Altered mental status [R41.82] Altered mental status, unspecified altered mental status type [R41.82] AMS (altered mental status) [R41.82] Patient Active Problem List   Diagnosis Date Noted   AMS (altered mental status) 02/04/2021   Altered mental status 02/03/2021   AKI (acute kidney injury) ([place]) 01/28/2021   GI bleed 01/18/2021   Bullous lesion 10/08/2020   Hearing loss 05/29/2020   Neuropathy 07/27/2019  Hyperkalemia 10/18/2018   Port-A-Cath in place 05/23/2018   Iron deficiency anemia due to chronic blood loss 04/19/2018   Acute right hip pain 03/10/2018    Hyperpigmented skin lesion 02/07/2018   Leg weakness, bilateral 12/23/2017   Hematemesis 12/04/2017   Pain localized to upper abdomen 10/05/2017   Lower extremity pain, bilateral 07/15/2017   Pulmonary HTN ([place]) 04/01/2017   Heart murmur 04/01/2017   Overgrown toenails 03/18/2017   Osteopenia after menopause 01/13/2017   Epistaxis 07/30/2016   Venous insufficiency of both lower extremities 04/27/2016   Anemia due to chronic blood loss    Dry skin 02/17/2016   Peripheral vascular disease ([place]) 12/30/2015   Pain in joint of right shoulder 10/22/2015   PAF (paroxysmal atrial fibrillation) ([place]) 82/57/4935   Lichen planus 52/17/4715   Healthcare maintenance 06/20/2015   Seborrheic keratosis 06/20/2015   Memory loss 01/30/2015   Simple febrile convulsions ([place]) 10/21/2014   Hyperammonemia ([place]) 10/03/2014   CKD (chronic kidney disease), stage III ([name]    Seizures ([place]) 09/2014   Acute kidney injury ([place]) 08/27/2014   Chronic diastolic CHF (congestive heart failure) ([place]) 10/03/2013   Acute on chronic heart failure with preserved ejection fraction (HFpEF) ([place]) 10/03/2013   Type 2 diabetes mellitus without complication ([place]) 95/39/6728   HTN (hypertension), benign 03/02/2012   Fatigue 07/10/2011   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   HHT (hereditary hemorrhagic telangiectasia) ([place]) 02/01/2011   PCP:  Mike Craze, DO Pharmacy:   [facility] [place] [address] [place] [address] Phone: [phone] Fax: [phone]     Social Determinants of Health (SDOH) Interventions    Readmission Risk Interventions No flowsheet data found.

## 2021-02-04 NOTE — Progress Notes (Signed)
Subjective:  Overnight Events: No acute events overnight  Patient was seen and examined on rounds.  Patient awake and alert this morning.  Mental status seems to have improved.  Patient oriented to year, month.  Reports she is a [name].  Thinks she came from rehab facility.  Is unsure why she is here.  Discussed need for IVF.  Patient requesting to go home today.  Objective:  Vital signs in last 24 hours: Vitals:   02/03/21 2145 02/03/21 2340 02/04/21 0515 02/04/21 0915  BP:  (!) 161/56 (!) 183/58 (!) 138/52  Pulse: 89 82 83 70  Resp:  18 17 18   Temp:  98.2 F (36.8 C) 98.5 F (36.9 C) 98.7 F (37.1 C)  TempSrc:  Oral  Oral  SpO2: 100% 100% 100% 97%  Weight:      Height:         Intake/Output Summary (Last 24 hours) at 02/04/2021 1211 Last data filed at 02/04/2021 0515 Gross per 24 hour  Intake 37.86 ml  Output 0 ml  Net 37.86 ml    Physical Exam: General: Well appearing elderly African-American female, NAD HENT: normocephalic, atraumatic EYES: conjunctiva non-erythematous, no scleral icterus CV: regular rate, normal rhythm, systolic murmur appreciated best at right 2nd CS Pulmonary: normal work of breathing on RA, lungs clear to auscultation, no rales, wheezes, rhonchi Abdominal: non-distended, soft, non-tender to palpation, normal BS Skin: Warm and dry, no rashes or lesions Neurological: MS: awake, alert and oriented x3, normal speech and fund of knowledge Motor: moves all extremities antigravity, no asterixis  Psych: normal affect   Assessment/Plan:  Active Problems:   Altered mental status  [name] is a 80 y.o. with pertinent PMH of hereditary hemorrhagic telangiectasias, recent hospitalization with severe GI bleeding from a gastric ulcer s/p clipping, hx of epilepsy, Insulin dependent type 2 DM , CKD stage 3a, HTN, HLD and dementia who presented with altered mental status  #Hyperammonemia #Pronounced echogenicity in right hepatic  lobe Patient has presented previously been hospitalized for AMS in the setting of AKI or hyperammonemia.  Ammonia level significantly elevated this admission 188.  LFTs normal.  Given multiple hospitalizations for similar findings, we did pursue RUQ US imaging to evaluate for liver disease.  Imaging showed pronounced echogenicity in the right hepatic lobe concerning for liver disease versus vascular anomaly.  Radiology recommended further imaging via MRI/CT. Plan: -MR liver with/without  #Altered mental status, resolved #Reported Hx of dementia Patient presented with altered mental status, was noted to not be eating or responding to questions at SNF, only responding to painful stimuli. Concern for infection as cause of AMS.  Lactate of 2.7 and CXR with questionable opacity, however, there was no leukocytosis, no fever, and unremarkable UA. Overall, infection seemed unlikely on admission, the patient did receive single doses of azithromycin and ceftriaxone in the ED. Serum chemistries were unremarkable for a metabolic cause aside from small AKI.  CTH negative and no focal neurologic deficits concerning for acute CVA. Ammonia was significantly elevated at 188 but the clinical significance of this is unclear, further work-up was initiated. Patient likely hypovolemic given her lactic acidosis and mild AKI. She received 1L IVF in the ED.  Patient received maintenance IVF with resolution of AKI.  Patient's mental status also improved following IVF, suggesting AKI, dehydration and poor p.o. fluid intake was significantly contributing to AMS. Back to mental status baseline.  Memantine 10 mg restarted this admission.   #Hx Hereditary hemorrhagic telangectasia syndrome #Hx  of UGIB s/p clipping #Iron deficiency anemia #GERD On admission, hemoglobin 10.3, consistent with mild normocytic anemia, stable since prior hospitalization.  Protonix 40 mg twice daily was restarted.   #AKI on CKD 3a, resolved Cr admission  1.6, baseline ~1.4.  Likely prerenal AKI secondary to dehydration from decreased p.o. intake.  Patient was treated with IV fluid resuscitation with improvement in creatinine to 1.2.  Kidney function back to baseline.  Held home potassium supplement, potassium appropriate this admission. Plan: -75cc/hr LR 15hrs   #Hx epilepsy Patient was continued on home Keppra.     #Type 2 diabetes with insulin dependence #Diabetic neuropathy Last A1c 7.8%, home medications: Humalog 75/25 12 units once daily, Spiriva 3 units before dinner, lispro 3-10 units twice daily, Trulicity 1.5 Mg weekly.  CBGs appropriate. Plan: -SSI moderate with meals -QHS correction -CBG monitoring before meals and nightly   #HFpEF Last echo done in August 2022 with EF of 65 to 70%. G2DD and severe left atrial enlargement. Patient on torsemide 20 mg daily PRN.  Hypervolemic on exam.  No need for diuresis.  Holding torsemide.   #Hypertension Patient takes amlodipine 2.5 mg daily at home.  Held in the setting of AMS.  Will restart today.  Diet: Carb modified VTE: Lovenox IVF: 75cc/hr LR 15hrs Code: Full  Prior to Admission Living Arrangement: SNF ([place]) Anticipated Discharge Location: SNF ([place])  Barriers to Discharge: Hyperammoniemia workup with further imaging (MR Liver) Dispo: Anticipated discharge in approximately 1 day(s).   Portions of this report may have been transcribed using voice recognition software. Every effort was made to ensure accuracy; however, inadvertent computerized transcription errors may be present.   Wayland Denis, MD 02/04/21,  12:11 PM Pager: [phone] Internal Medicine Resident, PGY-1 [name]

## 2021-02-05 ENCOUNTER — Inpatient Hospital Stay (HOSPITAL_COMMUNITY): Payer: Medicare Other

## 2021-02-05 DIAGNOSIS — Z515 Encounter for palliative care: Secondary | ICD-10-CM

## 2021-02-05 DIAGNOSIS — Z7189 Other specified counseling: Secondary | ICD-10-CM

## 2021-02-05 HISTORY — PX: IR US GUIDE VASC ACCESS RIGHT: IMG2390

## 2021-02-05 HISTORY — PX: IR ANGIOGRAM SELECTIVE EACH ADDITIONAL VESSEL: IMG667

## 2021-02-05 HISTORY — PX: IR ANGIOGRAM VISCERAL SELECTIVE: IMG657

## 2021-02-05 HISTORY — PX: IR EMBO ART  VEN HEMORR LYMPH EXTRAV  INC GUIDE ROADMAPPING: IMG5450

## 2021-02-05 LAB — COMPREHENSIVE METABOLIC PANEL
ALT: 13 U/L (ref 0–44)
AST: 19 U/L (ref 15–41)
Albumin: 2 g/dL — ABNORMAL LOW (ref 3.5–5.0)
Alkaline Phosphatase: 75 U/L (ref 38–126)
Anion gap: 10 (ref 5–15)
BUN: 20 mg/dL (ref 8–23)
CO2: 20 mmol/L — ABNORMAL LOW (ref 22–32)
Calcium: 8.2 mg/dL — ABNORMAL LOW (ref 8.9–10.3)
Chloride: 111 mmol/L (ref 98–111)
Creatinine, Ser: 2.16 mg/dL — ABNORMAL HIGH (ref 0.44–1.00)
GFR, Estimated: 23 mL/min — ABNORMAL LOW (ref >60)
Glucose, Bld: 460 mg/dL — ABNORMAL HIGH (ref 70–99)
Potassium: 4.2 mmol/L (ref 3.5–5.1)
Sodium: 141 mmol/L (ref 135–145)
Total Bilirubin: 0.6 mg/dL (ref 0.3–1.2)
Total Protein: 4 g/dL — ABNORMAL LOW (ref 6.5–8.1)

## 2021-02-05 LAB — HEMOGLOBIN A1C
Hgb A1c MFr Bld: 6.2 % — ABNORMAL HIGH (ref 4.8–5.6)
Mean Plasma Glucose: 131.24 mg/dL

## 2021-02-05 LAB — CBC
HCT: 22.5 % — ABNORMAL LOW (ref 36.0–46.0)
HCT: 22.2 % — ABNORMAL LOW (ref 36.0–46.0)
Hemoglobin: 7.1 g/dL — ABNORMAL LOW (ref 12.0–15.0)
Hemoglobin: 7.1 g/dL — ABNORMAL LOW (ref 12.0–15.0)
MCH: 30.3 pg (ref 26.0–34.0)
MCH: 30.9 pg (ref 26.0–34.0)
MCHC: 31.6 g/dL (ref 30.0–36.0)
MCHC: 32 g/dL (ref 30.0–36.0)
MCV: 96.2 fL (ref 80.0–100.0)
MCV: 96.5 fL (ref 80.0–100.0)
Platelets: 191 10*3/uL (ref 150–400)
Platelets: 198 10*3/uL (ref 150–400)
RBC: 2.34 MIL/uL — ABNORMAL LOW (ref 3.87–5.11)
RBC: 2.3 MIL/uL — ABNORMAL LOW (ref 3.87–5.11)
RDW: 15.7 % — ABNORMAL HIGH (ref 11.5–15.5)
RDW: 15.8 % — ABNORMAL HIGH (ref 11.5–15.5)
WBC: 10.7 10*3/uL — ABNORMAL HIGH (ref 4.0–10.5)
WBC: 13.3 10*3/uL — ABNORMAL HIGH (ref 4.0–10.5)
nRBC: 0 % (ref 0.0–0.2)
nRBC: 0 % (ref 0.0–0.2)

## 2021-02-05 LAB — BASIC METABOLIC PANEL
Anion gap: 6 (ref 5–15)
BUN: 26 mg/dL — ABNORMAL HIGH (ref 8–23)
CO2: 18 mmol/L — ABNORMAL LOW (ref 22–32)
Calcium: 7.7 mg/dL — ABNORMAL LOW (ref 8.9–10.3)
Chloride: 117 mmol/L — ABNORMAL HIGH (ref 98–111)
Creatinine, Ser: 2.03 mg/dL — ABNORMAL HIGH (ref 0.44–1.00)
GFR, Estimated: 24 mL/min — ABNORMAL LOW (ref >60)
Glucose, Bld: 527 mg/dL (ref 70–99)
Potassium: 5.6 mmol/L — ABNORMAL HIGH (ref 3.5–5.1)
Sodium: 141 mmol/L (ref 135–145)

## 2021-02-05 LAB — PREPARE RBC (CROSSMATCH)

## 2021-02-05 LAB — GLUCOSE, CAPILLARY
Glucose-Capillary: 418 mg/dL — ABNORMAL HIGH (ref 70–99)
Glucose-Capillary: 441 mg/dL — ABNORMAL HIGH (ref 70–99)
Glucose-Capillary: 484 mg/dL — ABNORMAL HIGH (ref 70–99)
Glucose-Capillary: 445 mg/dL — ABNORMAL HIGH (ref 70–99)
Glucose-Capillary: 308 mg/dL — ABNORMAL HIGH (ref 70–99)
Glucose-Capillary: 237 mg/dL — ABNORMAL HIGH (ref 70–99)

## 2021-02-05 MED ORDER — PANTOPRAZOLE INFUSION (NEW) - SIMPLE MED
8.0000 mg/h | INTRAVENOUS | Status: DC
  Administered 2021-02-05: 8 mg/h via INTRAVENOUS
  Filled 2021-02-05: qty 80
  Filled 2021-02-05: qty 100

## 2021-02-05 MED ORDER — INSULIN ASPART 100 UNIT/ML IJ SOLN: 10.0000 [IU] | Freq: Once | INTRAMUSCULAR | Status: DC

## 2021-02-05 MED ORDER — INSULIN GLARGINE-YFGN 100 UNIT/ML ~~LOC~~ SOLN
10.0000 [IU] | Freq: Every day | SUBCUTANEOUS | Status: DC
  Administered 2021-02-05: 20:00:00 10 [IU] via SUBCUTANEOUS
  Filled 2021-02-05 (×2): qty 0.1

## 2021-02-05 MED ORDER — LACTATED RINGERS IV SOLN: INTRAVENOUS | Status: AC

## 2021-02-05 MED ORDER — MIDAZOLAM HCL 2 MG/2ML IJ SOLN
INTRAMUSCULAR | Status: AC | PRN
  Administered 2021-02-05 (×4): .5 mg via INTRAVENOUS

## 2021-02-05 MED ORDER — PANTOPRAZOLE SODIUM 40 MG IV SOLR: 40.0000 mg | Freq: Two times a day (BID) | INTRAVENOUS | Status: DC

## 2021-02-05 MED ORDER — MIDAZOLAM HCL 2 MG/2ML IJ SOLN
INTRAMUSCULAR | Status: AC
  Filled 2021-02-05: qty 2

## 2021-02-05 MED ORDER — SODIUM CHLORIDE 0.9% IV SOLUTION: Freq: Once | INTRAVENOUS | Status: DC

## 2021-02-05 MED ORDER — PANTOPRAZOLE 80MG IVPB - SIMPLE MED
80.0000 mg | Freq: Once | INTRAVENOUS | Status: DC
  Filled 2021-02-05: qty 100

## 2021-02-05 MED ORDER — FENTANYL CITRATE (PF) 100 MCG/2ML IJ SOLN
INTRAMUSCULAR | Status: AC | PRN
  Administered 2021-02-05 (×3): 25 ug via INTRAVENOUS

## 2021-02-05 MED ORDER — LIDOCAINE HCL 1 % IJ SOLN
INTRAMUSCULAR | Status: AC
  Filled 2021-02-05: qty 20

## 2021-02-05 MED ORDER — INSULIN ASPART 100 UNIT/ML IJ SOLN
0.0000 [IU] | INTRAMUSCULAR | Status: DC
  Administered 2021-02-05: 21:00:00 7 [IU] via SUBCUTANEOUS
  Administered 2021-02-05: 18:00:00 9 [IU] via SUBCUTANEOUS

## 2021-02-05 MED ORDER — IOHEXOL 300 MG/ML  SOLN
100.0000 mL | Freq: Once | INTRAMUSCULAR | Status: AC | PRN
  Administered 2021-02-05: 65 mL via INTRA_ARTERIAL

## 2021-02-05 MED ORDER — IOHEXOL 300 MG/ML  SOLN
100.0000 mL | Freq: Once | INTRAMUSCULAR | Status: AC | PRN
  Administered 2021-02-05: 44 mL via INTRA_ARTERIAL

## 2021-02-05 MED ORDER — IOHEXOL 300 MG/ML  SOLN
80.0000 mL | Freq: Once | INTRAMUSCULAR | Status: AC | PRN
  Administered 2021-02-05: 80 mL via INTRAVENOUS

## 2021-02-05 MED ORDER — SODIUM CHLORIDE 0.9 % IV SOLN
2.0000 g | INTRAVENOUS | Status: DC
  Administered 2021-02-06: 23:00:00 2 g via INTRAVENOUS
  Filled 2021-02-05 (×3): qty 20

## 2021-02-05 MED ORDER — ENOXAPARIN SODIUM 30 MG/0.3ML IJ SOSY: 30.0000 mg | PREFILLED_SYRINGE | INTRAMUSCULAR | Status: DC

## 2021-02-05 MED ORDER — LACTATED RINGERS IV BOLUS
1000.0000 mL | Freq: Once | INTRAVENOUS | Status: AC
  Administered 2021-02-05: 1000 mL via INTRAVENOUS

## 2021-02-05 MED ORDER — METOCLOPRAMIDE HCL 5 MG/ML IJ SOLN
10.0000 mg | Freq: Once | INTRAMUSCULAR | Status: AC
  Administered 2021-02-05: 10 mg via INTRAVENOUS
  Filled 2021-02-05: qty 2

## 2021-02-05 MED ORDER — FENTANYL CITRATE (PF) 100 MCG/2ML IJ SOLN
INTRAMUSCULAR | Status: AC
  Filled 2021-02-05: qty 2

## 2021-02-05 NOTE — Procedures (Signed)
Interventional Radiology Procedure Note  Procedure: Mesenteric angiogram and left gastric embolization  Indication: Gastric hemorrhage  Findings: Please refer to procedural dictation for full description.  Complications: None  EBL: < 10 mL  Miachel Roux, MD [phone]

## 2021-02-05 NOTE — Progress Notes (Signed)
Inpatient Diabetes Program Recommendations  AACE/ADA: New Consensus Statement on Inpatient Glycemic Control (2015)  Target Ranges:  Prepandial:   less than 140 mg/dL      Peak postprandial:   less than 180 mg/dL (1-2 hours)      Critically ill patients:  140 - 180 mg/dL   Lab Results  Component Value Date   GLUCAP 441 (H) 02/05/2021   HGBA1C 7.8 (A) 11/28/2020    Review of Glycemic Control  Latest Reference Range & Units 02/04/21 20:30 02/05/21 07:00 02/05/21 07:02  Glucose-Capillary 70 - 99 mg/dL 292 (H) 418 (H) 441 (H)  (H): Data is abnormally high Diabetes history: Type 2 DM Outpatient Diabetes medications:  Current orders for Inpatient glycemic control: Novolog 0-15 units TID & HS, Novolog 10 units x 1  Inpatient Diabetes Program Recommendations:    Consider changing correction to Novolog 0-9 units Q4H.   Thanks, Bronson Curb, MSN, RNC-OB Diabetes Coordinator [phone] (8a-5p)

## 2021-02-05 NOTE — Sedation Documentation (Signed)
Vital signs stable. Procedure continues °

## 2021-02-05 NOTE — Sedation Documentation (Signed)
Vital signs stable. 

## 2021-02-05 NOTE — Anesthesia Preprocedure Evaluation (Addendum)
Anesthesia Evaluation  Patient identified by MRN, date of birth, ID band Patient awake    Reviewed: Allergy & Precautions, NPO status , Patient's Chart, lab work & pertinent test results  History of Anesthesia Complications Negative for: history of anesthetic complications  Airway Mallampati: II  TM Distance: >3 FB Neck ROM: Full    Dental  (+) Edentulous Upper, Poor Dentition, Dental Advisory Given, Missing   Pulmonary former smoker,  Quit smoking 1973, 20 pack year history    breath sounds clear to auscultation       Cardiovascular hypertension, Pt. on medications pulmonary hypertension (moderate pHTN)(-) angina+ Peripheral Vascular Disease and +CHF (grade 2 diastolic dysfunction)  + Valvular Problems/Murmurs (mild MR, mild AS) MR and AS  Rhythm:Regular Rate:Normal  Echo 09/2020: 1. Left ventricular ejection fraction, by estimation, is 65 to 70%. The  left ventricle has normal function. The left ventricle has no regional  wall motion abnormalities. There is mild left ventricular hypertrophy.  Left ventricular diastolic parameters  are consistent with Grade II diastolic dysfunction (pseudonormalization).  2. Right ventricular systolic function is normal. The right ventricular  size is normal. There is moderately elevated pulmonary artery systolic  pressure. The estimated right ventricular systolic pressure is 08.6 mmHg.  3. Left atrial size was severely dilated.  4. Right atrial size was upper normal.  5. A small to moderate pericardial effusion is present. The pericardial  effusion is circumferential. There is no evidence of cardiac tamponade.  6. The mitral valve is grossly normal. Mild mitral valve regurgitation.  7. The aortic valve is tricuspid. There is mild calcification of the  aortic valve. Aortic valve regurgitation is not visualized. Mild aortic  valve stenosis. Aortic valve mean gradient measures 13.6 mmHg.  Aortic  valve Vmax measures 2.67 m/s.  8. The inferior vena cava is dilated in size with >50% respiratory  variability, suggesting right atrial pressure of 8 mmHg.    Neuro/Psych Seizures -, Well Controlled,  Dementia    GI/Hepatic Neg liver ROS, GERD  Medicated and Controlled,Hx AVMs   Endo/Other  diabetes (glu 154), Type 2, Insulin Dependent  Renal/GU CRFRenal disease (CKD 3)Cr 2.16 from 1.21     Musculoskeletal negative musculoskeletal ROS (+)   Abdominal   Peds  Hematology  (+) Blood dyscrasia, anemia , 7.1/22.5, plt 191 12/28 10.1/29.5, plt 130 12/29   Anesthesia Other Findings   Reproductive/Obstetrics                           Anesthesia Physical Anesthesia Plan  ASA: 3  Anesthesia Plan: MAC   Post-op Pain Management:    Induction:   PONV Risk Score and Plan: 2 and Propofol infusion, TIVA and Treatment may vary due to age or medical condition  Airway Management Planned: Natural Airway and Simple Face Mask  Additional Equipment: None  Intra-op Plan:   Post-operative Plan:   Informed Consent: I have reviewed the patients History and Physical, chart, labs and discussed the procedure including the risks, benefits and alternatives for the proposed anesthesia with the patient or authorized representative who has indicated his/her understanding and acceptance.     Dental advisory given, Consent reviewed with POA and History available from chart only  Plan Discussed with: CRNA and Surgeon  Anesthesia Plan Comments:       Anesthesia Quick Evaluation

## 2021-02-05 NOTE — Progress Notes (Signed)
[facility] resident received secure chat from RN patient was found to be blood clots. Patient's SBP was in the 80s with MAP of 57. Resident and intern went to the bedside to evaluate patient.  On arrival, large blood clot was found in bed and on floor(see media tab). Patient was A&Ox3. She denied any SOB or dizziness.  A liter of fluid was ordered with improvement in SBP to the 110s to 120s and MAP above 65.   Assessment/Plan: Patient with a history of recent upper GI bleed status post endoscopy with achievement of hemostasis with 5 clips. Patient found to have extravasation of contrast in the distal stomach adjacent to the hemostatic clip on the CT chest/abdomen/pelvis.  Patient now with active GI bleed with vomiting of blood clots.  A.m. CBC showed hemoglobin drop from 10.4 to 7.1 this morning. Remains hemodynamically stable at the moment with no shortness of breath or dizziness. --1 L LR bolus x1 --IV Protonix loading dose plus infusion --IV reglan 10 mg  --2 units PRBCs --GI consult.ed, recommended IV Reglan 10 mg and IR consult  --Will plan for endoscopy this morning --IR consulted, plan for possible embolization as first case this morning --Spouse updated

## 2021-02-05 NOTE — Progress Notes (Addendum)
Pt found vomiting blood clots during AM rounds. Pt still A&OX3, Dr on call notified. Dr. Arrived and had pt receive a 1,000 Bolus of LR and will have a bolus of protonix as well as a protonix drip established after the bolus. Waiting on Protonix bolus and drip, reglan IV given. Pt blood sugar twice over 400.  Pt is lso to receive blood.  Louanne Skye 02/05/21 6:10 AM

## 2021-02-05 NOTE — Progress Notes (Signed)
S/p Mesenteric angiogram and left gastric embolization, ok to resume DVT prophylaxis lovenox per IR and Dr. Marlou Sa.  Onnie Boer, PharmD, BCIDP, AAHIVP, CPP Infectious Disease Pharmacist 02/05/2021 11:10 AM

## 2021-02-05 NOTE — Sedation Documentation (Signed)
Pt tolerated procedure well.  Totals: Time-50mins Fentanyl 22mcg Versed 2 mg

## 2021-02-05 NOTE — Social Work (Signed)
CSW attempted to contact pt's goddaughter Tabitha at [phone], no answer. There was no VM option, CSW will follow up again shortly.

## 2021-02-05 NOTE — Progress Notes (Addendum)
[facility] INTERVAL PROGRESS NOTE:  Paged by RN to bedside for ongoing bleeding. Patient is having bright red blood per rectum and is passing large clots. Patient evaluated at bedside. Noted to have dark red clots on towel but no active bleed noted per rectum. She did have an episode of hematemesis while I was in the room and threw up a clot. She is currently hemodynamically stable. Protonix gtt and LR 175cc/hr infusing.   Blood pressure (!) 107/49, pulse 83, temperature 98.3 F (36.8 C), temperature source Oral, resp. rate 18, height 5\' 3"  (1.6 m), weight 64 kg, SpO2 97 %.  Physical Exam  Constitutional: Acute on chronically ill appearing elderly female; no acute distress  HENT: Normocephalic and atraumatic, conjunctiva nl; NGT in place with dark drainage noted  Cardiovascular: Normal rate, regular rhythm, S1 and S2 present Respiratory: Effort is normal.  Lungs are clear to auscultation bilaterally. GI: Nondistended, soft, nontender to palpation, +BS Neuro: somnolent but arouseable to voice; no apparent focal deficits  Skin: Warm and dry.   Assessment/Plan: Acute GI bleed in setting of HHT  S/p IR guided embolization this morning. Evaluated for concerns of ongoing bleed despite embolization. She is hemodynamically stable at this time. Discussed with IR - Dr Dwaine Gale. Suspect that this may just be residual blood from GI bleed earlier. Recommend continued monitoring. Hemoglobin on STAT CBC stable from this morning.  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 He expresses understanding.  Also discussed that if patient were to decompensate, she would likely not survive resuscitation. Recommended to change code status to DNR. Mr Bundren would like some time to think about this  further. Attempted to reach out to him an hour later; however, unable to reach as call goes straight to voicemail x3.   - Continue IV Protonix  - Patient has not received blood ordered from this morning - awaiting transfusion of 2u pRBC - NGT to intermittent suction  - Continue to monitor for any decompensation - Continue attempts to reach out to family - F/u palliative care discussions

## 2021-02-05 NOTE — Sedation Documentation (Signed)
Patient is resting comfortably. 

## 2021-02-05 NOTE — Progress Notes (Signed)
HD#1 SUBJECTIVE:  Patient Summary: [name] is a 80 y.o. with a pertinent PMH of hereditary hemorrhagic telangiectasias, recent hospitalization with severe GI bleed from a gastric ulcer status post clipping, history of epilepsy, insulin-dependent type 2 diabetes mellitus, CKD stage III, hypertension, hyperlipidemia, dementia, admitted for altered mental status.   Overnight Events: Patient experienced episode of hematemesis, with large blood clot noted on the floor to the right of her bed as well as in a bin.  She was hypotensive with systolic in the 00X and map of 57.  She did receive 1 L LR bolus, 2 units PRBC ordered for transfusion.  Fluid resuscitation did improve hemodynamic stability.  Interim History: Patient is a currently undergoing IR embolization for active GI bleed.  GI also aware and will proceed with EGD once patient is stable from IR procedure.   OBJECTIVE:  Vital Signs: Vitals:   02/05/21 0623 02/05/21 0640 02/05/21 0700 02/05/21 0706  BP: (!) 118/58 (!) 113/40 (!) 115/43 (!) 120/46  Pulse:      Resp:  20    Temp:      TempSrc:      SpO2:      Weight:      Height:       Supplemental O2: Room Air SpO2: 100 %  Filed Weights   02/03/21 0935 02/04/21 2034  Weight: 59 kg 64 kg     Intake/Output Summary (Last 24 hours) at 02/05/2021 0849 Last data filed at 02/04/2021 1900 Gross per 24 hour  Intake 420.87 ml  Output --  Net 420.87 ml   Net IO Since Admission: 458.73 mL [02/05/21 0849]  Physical Exam: Constitutional: Chronically ill appearing female resting in bed with bin containing hematemesis. Cardio: Regular rate and rhythm.  Systolic murmur noted. Pulm: Clear to auscultation bilaterally.  Normal work of breathing on room air. Abdomen: Soft, nondistended, nontender. MSK: Negative for extremity edema. Skin: Skin is warm and dry. Neuro: Alert and oriented x3.  Moves all 4 extremities spontaneously. Psych: Normal mood and affect.  Patient  Lines/Drains/Airways Status     Active Line/Drains/Airways     Name Placement date Placement time Site Days   Implanted Port 04/13/18 Right Chest 04/13/18  0855  Chest  1029   Implanted Port Right Chest --  --  Chest  --   Peripheral IV 02/03/21 22 G Right Hand 02/03/21  1026  Hand  2   External Urinary Catheter 01/18/21  0722  --  18   External Urinary Catheter 01/28/21  1940  --  8   External Urinary Catheter 02/03/21  1309  --  2             ASSESSMENT/PLAN:  Assessment: Principal Problem:   AMS (altered mental status) Active Problems:   GI bleed   Altered mental status  [name] is an 80 year old African-American female with pertinent past medical history of hereditary hemorrhagic telangiectasias, recent hospitalization with severe GI bleeding from gastric ulcer status post clipping, history of epilepsy, insulin-dependent type 2 diabetes mellitus, CKD stage III, hypertension, hyperlipidemia, and dementia who presented with altered mental status.  Plan: #History of hereditary hemorrhagic telangiectasia syndrome #History of upper GI bleed status post clipping #Iron deficiency anemia #GERD CT chest, abdomen, pelvis 12/27 was significant for contrast extravasation within the distal stomach adjacent to the hemostasis clip, consistent with recurrent active gastric hemorrhage from a known ulcer.  Further there was a significant drop in hemoglobin from 10.4 to 7.1 overnight.  In this time she did have hematemesis with large blood clots.  At the time of the hematemesis the patient was noted to be hypotensive with systolic blood pressure in the 80s and MAP of 57.  She is hemodynamically stable at this time after resuscitation with IV fluids and blood transfusion.  Of note, CT abdomen did show evidence of marked gastric distention and retained gastric products. -Status post 1 L LR bolus, 2 units packed red blood cells ordered for transfusion though it is unclear if these were  given -Initiated overnight on Protonix loading dose and infusion, Reglan 10 mg IV -Patient undergoing IR embolectomy this morning with plan for GI to perform EGD after this procedure - Of note, given evidence of iron deposition in liver, spleen, and marrow spaces, we will need to be judicious with blood transfusions.  -Transfuse if Hgb <7 and hemodynamically unstable  #Hyperammonemia Ammonia level on admission was elevated to 188, normal LFTs.  Right upper quadrant ultrasound was obtained and was concerning for pronounced echogenicity in the right hepatic lobe concerning for liver disease versus vascular anomaly.  MRI of the liver was obtained which showed serpiginous areas of T2 hyperintensity favored to represent prominent vasculature in the liver, perhaps due to underlying shunt lesions, however no discrete hepatic lesion noted.  There are also signs of iron deposition in the liver, spleen, and marrow spaces.  Follow-up CT of the abdomen showed hepatic steatosis. -Continue to monitor.  If recurrence of altered mental status, consider checking additional ammonia level and giving lactulose.  #Altered mental status, resolved #Reported history of dementia Patient initially presented with altered mental status and was described as not eating or responding to questions at her SNF facility.  Admission lactate was 2.7 and chest x-ray showed questionable opacity however patient did not have a leukocytosis, fever, or concern for infection on UA at the time.  She did receive a single dose of azithromycin and ceftriaxone in the emergency department.  Work-up to this point has included serum chemistries which were unremarkable for metabolic causes of AMS, CT head which was negative and no focal neurological deficits concerning for acute CVA.  She did receive 1 L IV fluid in the ED as well as maintenance IV fluids for lactic acidosis and mild AKI.  Throughout her admission her mental status has improved with volume  resuscitation.  On exam today [name] is answering questions appropriately.   -Patient does have leukocytosis of 10.7, up from 6.1.  Likely secondary to active GI bleed.  Continue to monitor.  #AKI on CKD stage IIIa, resolved On presentation patient was noted to have AKI on CKD stage IIIa which resolved with fluid resuscitation.  However with acute GI bleed overnight she is showing signs of recurrence of AKI with increase of creatinine to 2.16 from 1.21.  GFR noted to be 23 from 45. -Likely secondary to hypovolemia from acute GI bleed. -Continue to monitor.  #History of epilepsy Continue home Keppra.  #Type 2 diabetes mellitus with insulin dependence #Diabetic neuropathy Most recent HbA1c of 7.8%.  Her home regimen includes Humalog 75/25, 12 units once daily, Spiriva 3 units in the evening, lispro 3-10 units twice daily, Trulicity 1.5 mg weekly.  CMP for morning draw was concerning for glucose of 460.  Multiple repeat POC CBGs revealed glucose greater than 400. -Patient given one-time dose of NovoLog 10 units -Continue sliding scale insulin moderate with meals -QHS correction -CBG monitoring before meals and nightly  #HFpEF Most recent echo in  August 2022 showed EF of 65 to 51%, grade 2 diastolic dysfunction and severe left atrial enlargement.  Patient has been on torsemide 20 mg daily as needed. -Given active GI bleed, hold torsemide as no need for diuresis immediately.  #Hypertension Patient is currently hemodynamically stable, however does have an active GI bleed.  She was restarted on home amlodipine 2.5 mg daily on 12/27. -Hold home amlodipine 2.5 mg in the setting of active GI bleed.  #Left upper lobe consolidation MR of liver showed a 2.2 x 2.3 cm nodular area in the left upper chest suspicious for pulmonary nodule or mass.  On CT chest this was described as a rounded left upper lobe consolidation favoring focal pneumonia over neoplasm with recommendation of follow-up imaging in  6 to 8 weeks to assess for resolution of focal pneumonia versus rule out neoplasm. - Given concern of mass vs. 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  CT also showed signs of iron deposition in the spleen. -Incidental finding, follow-up as outpatient  Best Practice: Diet: N.p.o. VTE:   None Code: Full AB: None DISPO: Anticipated discharge pending Medical stability.  Portions of this report may have been transcribed using voice recognition software. Every effort was made to ensure accuracy; however, inadvertent computerized transcription errors may be present.   Signature: Farrel Gordon, D.O.  Internal Medicine Resident, PGY-1 [name]  Pager: [phone] 8:49 AM, 02/05/2021   Please contact the on call pager after 5 pm and on weekends at [phone].

## 2021-02-05 NOTE — Progress Notes (Addendum)
Referring Physician(s): [place]  Supervising Physician: Mir, Biochemist, clinical  Patient Status:  [facility] - In-pt  Chief Complaint: GI bleed   Subjective: Patient familiar to IR service from Port-A-Cath placement in 2016.  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  In addition [name] has GERD, heart murmur, hypertension, hyperlipidemia, pulmonary hypertension, prior seizures, and diabetes.  [name] was recently admitted with altered mental status, AKI and elevated ammonia levels.  [name] is status post endoscopy on 01/19/2021 secondary to coffee-ground emesis and melena revealing:  normal esophagus. - Z-line regular, 35 cm from the incisors. - Oozing gastric ulcer with pigmented material. Injected. Clips were placed. - Gastritis. - Normal examined duodenum.  [name] now presents with recurrent hematemesis soft BPs.  [name] has received fluid boluses along with IV Protonix/Reglan and 2 units of packed red cells.  ET chest abdomen pelvis performed earlier this morning revealed:  1. Contrast extravasation within the distal stomach adjacent to the hemostasis clip, consistent with recurrent active gastric hemorrhage from known ulcer. 2. Rounded left upper lobe consolidation, which has developed since 09/29/2020. Favor focal pneumonia over neoplasm. Chest x-ray follow-up in 6-8 weeks is recommended to ensure complete resolution and exclude underlying neoplasm. 3. Well-circumscribed 3.4 cm splenic mass, favor hemangioma given decreased prominence on delayed imaging. 4. Hepatic steatosis. 5. Marked gastric distension and retained gastric contents  Current labs include WBC 10.7, hemoglobin 7.1, platelets normal, creatinine 2.16, PT/INR normal. Request now received for mesenteric/visceral arteriogram with embolization. [name] currently denies fever,HA,CP,worsening dyspnea, cough, worsening abd/back pain.  Past Medical History:  Diagnosis Date    Chronic anemia    Chronic diastolic CHF (congestive heart failure) ([place]) 10/03/2013   Chronic GI bleeding    /notes 11/29/2014   Family history of anesthesia complication    "niece has a hard time coming out" (09/15/2012)   Frequent nosebleeds    chronic   Gastric AV malformation    /notes 11/29/2014   GERD (gastroesophageal reflux disease)    Heart murmur 04/01/2017   Moderate AVSC on echo 09/2016   History of blood transfusion "several"   History of epistaxis    HTN (hypertension), benign 03/02/2012   Hyperlipidemia    Iron deficiency anemia    chronic infusions"   Lichen planus    Both lower extremities   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   Past Surgical History:  Procedure Laterality Date   CATARACT EXTRACTION     "I think it was just one eye"   ESOPHAGOGASTRODUODENOSCOPY  02/26/2011   Procedure: ESOPHAGOGASTRODUODENOSCOPY (EGD);  Surgeon: [name], MD;  Location: [name];  Service: Endoscopy;  Laterality: N/A;   ESOPHAGOGASTRODUODENOSCOPY N/A 11/08/2012   Procedure: ESOPHAGOGASTRODUODENOSCOPY (EGD);  Surgeon: [name], MD;  Location: [name];  Service: Endoscopy;  Laterality: N/A;   ESOPHAGOGASTRODUODENOSCOPY N/A 10/04/2013   Procedure: ESOPHAGOGASTRODUODENOSCOPY (EGD);  Surgeon: Winfield Cunas., MD;  Location: [name];  Service: Endoscopy;  Laterality: N/A;  with APC on stand-by   ESOPHAGOGASTRODUODENOSCOPY N/A 07/06/2014   Procedure: ESOPHAGOGASTRODUODENOSCOPY (EGD);  Surgeon: Clarene Essex, MD;  Location: [name];  Service: Endoscopy;  Laterality: N/A;   ESOPHAGOGASTRODUODENOSCOPY N/A 09/05/2014   Procedure: ESOPHAGOGASTRODUODENOSCOPY (EGD);   Surgeon: Laurence Spates, MD;  Location: [name];  Service: Endoscopy;  Laterality: N/A;  APC on standby to control bleeding   ESOPHAGOGASTRODUODENOSCOPY N/A 11/29/2014   Procedure: ESOPHAGOGASTRODUODENOSCOPY (EGD);  Surgeon: [name], MD;  Location: [facility] ENDOSCOPY;  Service: Endoscopy;  Laterality: N/A;   ESOPHAGOGASTRODUODENOSCOPY N/A 09/28/2015   Procedure: ESOPHAGOGASTRODUODENOSCOPY (EGD);  Surgeon: Clarene Essex, MD;  Location: [facility] ENDOSCOPY;  Service: Endoscopy;  Laterality: N/A;   ESOPHAGOGASTRODUODENOSCOPY (EGD) WITH PROPOFOL N/A 12/04/2017   Procedure: ESOPHAGOGASTRODUODENOSCOPY (EGD) WITH PROPOFOL;  Surgeon: [name], MD;  Location: [place];  Service: Endoscopy;  Laterality: N/A;   ESOPHAGOGASTRODUODENOSCOPY (EGD) WITH PROPOFOL N/A 12/24/2020   Procedure: ESOPHAGOGASTRODUODENOSCOPY (EGD) WITH PROPOFOL;  Surgeon: Arta Silence, MD;  Location: [place];  Service: Endoscopy;  Laterality: N/A;   ESOPHAGOGASTRODUODENOSCOPY (EGD) WITH PROPOFOL N/A 01/19/2021   Procedure: ESOPHAGOGASTRODUODENOSCOPY (EGD) WITH PROPOFOL;  Surgeon: [name], MD;  Location: [place];  Service: Endoscopy;  Laterality: N/A;   ESOPHAGOGASTRODUODENOSCOPY ENDOSCOPY  08/19/2006   with laser treatment   HEMOSTASIS CLIP PLACEMENT  12/24/2020   Procedure: HEMOSTASIS CLIP PLACEMENT;  Surgeon: Arta Silence, MD;  Location: [facility] ENDOSCOPY;  Service: Endoscopy;;   HEMOSTASIS CLIP PLACEMENT  01/19/2021   Procedure: HEMOSTASIS CLIP PLACEMENT;  Surgeon: [name], MD;  Location: [place];  Service: Endoscopy;;   HOT HEMOSTASIS N/A 07/06/2014   Procedure: HOT HEMOSTASIS (ARGON PLASMA COAGULATION/BICAP);  Surgeon: Clarene Essex, MD;  Location: [name];  Service: Endoscopy;  Laterality: N/A;   HOT HEMOSTASIS N/A 09/28/2015   Procedure: HOT HEMOSTASIS (ARGON PLASMA COAGULATION/BICAP);  Surgeon: Clarene Essex, MD;  Location: [facility] ENDOSCOPY;  Service: Endoscopy;  Laterality: N/A;   HOT  HEMOSTASIS N/A 12/04/2017   Procedure: HOT HEMOSTASIS (ARGON PLASMA COAGULATION/BICAP);  Surgeon: [name], MD;  Location: [place];  Service: Endoscopy;  Laterality: N/A;   NASAL HEMORRHAGE CONTROL     "for bleeding"    SAVORY DILATION  02/26/2011   Procedure: SAVORY DILATION;  Surgeon: [name], MD;  Location: [name];  Service: Endoscopy;  Laterality: N/A;  c-arm needed   SCLEROTHERAPY  12/24/2020   Procedure: SCLEROTHERAPY;  Surgeon: Arta Silence, MD;  Location: [facility] ENDOSCOPY;  Service: Endoscopy;;   SCLEROTHERAPY  01/19/2021   Procedure: Clide Deutscher;  Surgeon: [name], MD;  Location: [facility] ENDOSCOPY;  Service: Endoscopy;;   SUBMUCOSAL INJECTION  12/04/2017   Procedure: SUBMUCOSAL INJECTION;  Surgeon: [name], MD;  Location: [facility] ENDOSCOPY;  Service: Endoscopy;;     Allergies: Aspirin  Medications: Prior to Admission medications   Medication Sig Start Date End Date Taking? Authorizing Provider  ACCU-CHEK GUIDE test strip USE AS INSTRUCTED TO CHECK BLOOD SUGAR 3 TIMES DAILY. Patient taking differently: 1 each by Other route in the morning, at noon, and at bedtime. 08/08/20  Yes Elayne Snare, MD  Accu-Chek Softclix Lancets lancets 1 each by Other route 3 (three) times daily. Use as instructed to check blood sugar 3 times per day dx code E11.65 02/01/20  Yes Elayne Snare, MD  amLODipine (NORVASC) 2.5 MG tablet Take 1 tablet (2.5 mg total) by mouth every morning. 01/15/21  Yes Eugenie Filler, MD  Continuous Blood Gluc Sensor (FREESTYLE LIBRE 2 SENSOR) MISC 2 Devices by Does not apply route every 14 (fourteen) days. 04/25/20  Yes Elayne Snare, MD  diclofenac Sodium (VOLTAREN) 1 % GEL Apply 2 g topically 4 (four) times daily. Patient taking differently: Apply 2 g topically  4 (four) times daily as needed (pain). 12/26/20  Yes Lacinda Axon, MD  gabapentin (NEURONTIN) 100 MG capsule TAKE 1 CAPSULE BY MOUTH TWICE DAILY Patient taking differently:  Take 100 mg by mouth 2 (two) times daily. 08/16/20  Yes Katsadouros, Vasilios, MD  HUMALOG MIX 75/25 KWIKPEN (75-25) 100 UNIT/ML Kwikpen INJECT 10 UNITS SUBCUTANEOUSLY ONCE DAILY Patient taking differently: Inject 12 Units into the skin every morning. 04/17/20  Yes Elayne Snare, MD  insulin lispro (HUMALOG KWIKPEN) 100 UNIT/ML KwikPen 3 units before dinner Patient taking differently: Inject 2 Units into the skin 3 (three) times daily. 10/03/20  Yes Elayne Snare, MD  insulin lispro (HUMALOG) 100 UNIT/ML injection Inject 3-10 Units into the skin 2 (two) times daily. 3 units once a day for diabetes and 10 units before dinner.   Yes [provider]  levETIRAcetam (KEPPRA) 500 MG tablet TAKE 1 TABLET BY MOUTH TWICE DAILY Patient taking differently: Take 500 mg by mouth 2 (two) times daily. 06/07/20  Yes [name], Areeg N, DO  memantine (NAMENDA) 10 MG tablet TAKE 1 TABLET BY MOUTH TWICE DAILY Patient taking differently: Take 10 mg by mouth 2 (two) times daily. 11/11/20  Yes [name], Areeg N, DO  Misc Natural Products (IMMUNE FORMULA PO) Take 1 tablet by mouth daily.   Yes [provider]  Multiple Vitamins-Minerals (MULTI FOR HER 50+) TABS Take 1 tablet by mouth daily with breakfast.   Yes [provider]  pantoprazole (PROTONIX) 40 MG tablet Take 1 tablet (40 mg total) by mouth 2 (two) times daily. 12/26/20  Yes Lacinda Axon, MD  potassium chloride SA (KLOR-CON M) 20 MEQ tablet Take 20 mEq by mouth 2 (two) times daily.   Yes [provider]  torsemide (DEMADEX) 20 MG tablet Take 1 tablet (20 mg total) by mouth as needed (Please take as needed for for lower extremity swelling OR weight gain >2-3lbs per day or >5lbs per week). 01/30/21  Yes Wayland Denis, MD  triamcinolone cream (KENALOG) 0.1 % Apply 1 application topically 2 (two) times daily.   Yes [provider]  TRULICITY 1.5 FT/7.3UK SOPN INJECT 1.5MG  SUBCUTANEOUSLY EVERY WEEK ON THURSDAY Patient taking  differently: Inject 1.5 mg into the skin every Thursday. 07/29/20  Yes Elayne Snare, MD  triamcinolone ointment (KENALOG) 0.5 % APPLY 1 APPLICATION TOPICALLY TO RASH TWICE DAILY FOR ITCHING Patient not taking: Reported on 02/03/2021 08/16/20   Riesa Pope, MD     Vital Signs: BP (!) 120/46 (BP Location: Left Arm)    Pulse 93    Temp 98.9 F (37.2 C)    Resp 20    Ht 5\' 3"  (1.6 m)    Wt 141 lb 1.5 oz (64 kg)    LMP  (LMP Unknown)    SpO2 100%    BMI 24.99 kg/m   Physical Exam patient awake, oriented to person, birthdate, location, year, not president; chest clear to auscultation bilaterally.  Chest wall Port-A-Cath.  Heart with regular rate and rhythm, positive murmur.  Abdomen soft, positive bowel sounds, currently nontender.  No lower extremity edema.  Imaging: CT HEAD WO CONTRAST  Result Date: 02/03/2021 CLINICAL DATA:  Altered mental status EXAM: CT HEAD WITHOUT CONTRAST TECHNIQUE: Contiguous axial images were obtained from the base of the skull through the vertex without intravenous contrast. COMPARISON:  01/28/2021 FINDINGS: Brain: No evidence of acute infarction, hemorrhage, hydrocephalus, extra-axial collection or mass lesion/mass effect. Extensive periventricular and deep white matter hypodensity. Vascular: No hyperdense vessel or unexpected calcification.  Skull: Normal. Negative for fracture or focal lesion. Sinuses/Orbits: No acute finding. Other: None. IMPRESSION: No acute intracranial pathology. Advanced small-vessel white matter disease. Electronically Signed   By: Delanna Ahmadi M.D.   On: 02/03/2021 09:59   MR LIVER WO CONRTAST  Result Date: 02/04/2021 CLINICAL DATA:  A female at age 89 presents for evaluation of hepatic abnormalities discovered on recent sonogram. EXAM: MRI ABDOMEN WITHOUT CONTRAST TECHNIQUE: Multiplanar multisequence MR imaging was performed without the administration of intravenous contrast. COMPARISON:  Previous chest x-rays and abdominal sonogram.  FINDINGS: Lower chest: No sign of effusion. 2.2 x 2.3 cm nodular area in the LEFT upper chest suspicious for pulmonary nodule or mass, not well assessed on MR. Cardiac enlargement. RIGHT-sided Port-A-Cath, not well assessed. No effusion or consolidation. Hepatobiliary: Signs of iron deposition in the liver. Diffusely heterogeneous appearance of the liver with serpiginous areas of variable signal on T2, not well assessed. No gross intrahepatic biliary duct distension, not well assessed. Suggestion of steatotic changes as well as iron deposition in the liver. No pericholecystic stranding. Pancreas: Limited assessment of the pancreas, markedly limited by susceptibility artifact presumably from hemostatic clips placed during recent endoscopy. No gross pancreatic ductal dilation. Spleen:  Splenic iron deposition. Adrenals/Urinary Tract: Smooth renal contours. No hydronephrosis. Normal adrenal glands. Small cyst in the interpolar LEFT kidney. Stomach/Bowel: Susceptibility artifact about the stomach obscures much of the LEFT hemi liver on many of the sequences acquired likely related to hemostatic clips placed during recent endoscopy. Similar artifact seen in the RIGHT lower quadrant potentially related to interval passage of some of these clip since previous imaging Vascular/Lymphatic: No adenopathy in the retroperitoneum. Vascular structures not well assessed given lack of intravenous contrast. No signs of aneurysmal dilation of the abdominal aorta. Other:  No ascites. Musculoskeletal: Iron deposition in marrow spaces. Heterogeneous marrow signal and many areas in some areas suggestive of vertebral hemangiomata, in other areas more compatible with degenerative endplate changes. Overall not well assessed. IMPRESSION: Signs of iron deposition in the liver, spleen and marrow spaces. Likely due to exogenous administration 2.2 x 2.3 cm nodular area in the LEFT upper chest suspicious for pulmonary nodule or mass, not well  assessed on MR. Given suspicion for pulmonary neoplasm and other findings seen on previous and current imaging would suggest CT of the chest, abdomen and pelvis, would suggest contrast for optimum assessment as allowed by renal function. Serpiginous areas of T2 hyperintensity favored to represent prominent vasculature in the liver perhaps due to underlying shunt lesions. No discrete hepatic lesion is seen on noncontrast imaging. Variable signal in the spleen raising the question of underlying splenic lesion. Suggest attention on dedicated abdominal imaging. Hemostatic clips causing substantial susceptibility artifact in the upper abdomen. Due to this and the patient's inability to hold their breath contrast was not administered. These results were called by telephone at the time of interpretation on 02/04/2021 at 5:31 pm to provider Dr. Lisabeth Devoid, Who verbally acknowledged these results. Electronically Signed   By: Zetta Bills M.D.   On: 02/04/2021 17:32   CT CHEST ABDOMEN PELVIS W CONTRAST  Result Date: 02/05/2021 CLINICAL DATA:  Left lung lesion, metastatic workup EXAM: CT CHEST, ABDOMEN, AND PELVIS WITH CONTRAST TECHNIQUE: Multidetector CT imaging of the chest, abdomen and pelvis was performed following the standard protocol during bolus administration of intravenous contrast. CONTRAST:  84mL OMNIPAQUE IOHEXOL 300 MG/ML  SOLN COMPARISON:  02/04/2021, 01/28/2021, 12/31/2020, 09/29/2020 FINDINGS: CT CHEST FINDINGS Cardiovascular: Heart is unremarkable without pericardial effusion. No evidence  of thoracic aortic aneurysm or dissection. Mild atherosclerosis. Mediastinum/Nodes: No enlarged mediastinal, hilar, or axillary lymph nodes. Thyroid gland, trachea, and esophagus demonstrate no significant findings. Lungs/Pleura: Rounded area of consolidation within the left upper lobe measures 2.2 x 1.6 by 1.7 cm, with slightly spiculated margins. This is most consistent with focal pneumonia given the interval development  since 09/29/2020. Follow-up chest x-ray recommended in 6-8 weeks after completion of appropriate medical management to assess for interval resolution and exclude underlying neoplasm. No effusion or pneumothorax.  Central airways are patent. Musculoskeletal: No acute or destructive bony lesions. Reconstructed images demonstrate no additional findings. CT ABDOMEN PELVIS FINDINGS Hepatobiliary: Hepatic steatosis. Ectatic vasculature within the right lobe liver corresponding to the previous MRI and ultrasound findings. No discrete liver mass. Gallbladder is unremarkable. Pancreas: Unremarkable. No pancreatic ductal dilatation or surrounding inflammatory changes. Spleen: Well-circumscribed 3.4 x 2.9 cm splenic mass corresponds to the signal abnormality on previous MRI. Decreased prominence on delayed imaging suggests underlying hemangioma. Heterogeneity throughout the remainder of the spleen consistent with iron deposition reported on MRI. Adrenals/Urinary Tract: Adrenal glands are unremarkable. Kidneys are normal, without renal calculi, focal lesion, or hydronephrosis. Bladder is unremarkable. Stomach/Bowel: No bowel obstruction or ileus. Normal appendix right lower quadrant. Hemostasis clip is seen within the gastric antrum. Adjacent to the clip, there is contrast extravasation consistent with active gastric hemorrhage. Stomach is distended, with retained gastric contents. Hemostasis clip within the proximal colon may reflect migrated clip from prior endoscopy. Vascular/Lymphatic: No significant vascular findings are present. No enlarged abdominal or pelvic lymph nodes. Reproductive: Multiple uterine fibroids.  No adnexal masses. Other: No free fluid or free intraperitoneal gas. No abdominal wall hernia. Musculoskeletal: No acute or destructive bony lesions. Reconstructed images demonstrate no additional findings. IMPRESSION: 1. Contrast extravasation within the distal stomach adjacent to the hemostasis clip,  consistent with recurrent active gastric hemorrhage from known ulcer. 2. Rounded left upper lobe consolidation, which has developed since 09/29/2020. Favor focal pneumonia over neoplasm. Chest x-ray follow-up in 6-8 weeks is recommended to ensure complete resolution and exclude underlying neoplasm. 3. Well-circumscribed 3.4 cm splenic mass, favor hemangioma given decreased prominence on delayed imaging. 4. Hepatic steatosis. 5. Marked gastric distension and retained gastric contents. Critical Value/emergent results were called by telephone at the time of interpretation on 02/05/2021 at 3:51 am to provider DR [facility], who verbally acknowledged these results. Electronically Signed   By: Randa Ngo M.D.   On: 02/05/2021 03:51   DG Chest Portable 1 View  Result Date: 02/03/2021 CLINICAL DATA:  Altered mental status, drooling LEFT-side, CHF, GERD, hypertension, type II diabetes mellitus EXAM: PORTABLE CHEST 1 VIEW COMPARISON:  Portable exam 1046 hours compared to 01/28/2021 FINDINGS: RIGHT jugular Port-A-Cath with tip projecting over SVC near cavoatrial junction. Enlargement of cardiac silhouette. Mediastinal contours and pulmonary vascularity normal. Atherosclerotic calcification aorta. Vague opacity LEFT upper lobe question infiltrate. Remaining lungs clear. No pleural effusion or pneumothorax. Bones demineralized. IMPRESSION: Enlargement of cardiac silhouette with questionable LEFT upper lobe infiltrate. Aortic Atherosclerosis (ICD10-I70.0). Electronically Signed   By: Lavonia Dana M.D.   On: 02/03/2021 11:14   US Abdomen Limited RUQ (LIVER/GB)  Result Date: 02/04/2021 CLINICAL DATA:  In a 80 year old female presents with abnormal liver function and elevated ammonia levels. EXAM: ULTRASOUND ABDOMEN LIMITED RIGHT UPPER QUADRANT COMPARISON:  Only renal sonogram from 2016 is available for comparison. CT of the abdomen and pelvis, remote evaluation from 2006 also noted. FINDINGS: Gallbladder: Small amount of  sludge in the dependent gallbladder. No wall  thickening or pericholecystic fluid. No reported tenderness over the gallbladder. Common bile duct: Diameter: 2.9 mm Liver: Echogenic liver. Area of more pronounced echogenicity in the RIGHT hepatic lobe (image 59. Central serpiginous anechoic structures on grayscale imaging some of which do not appear to fill with color Doppler some of which do fill more centrally, extending to the periphery these dilated structures are noted also seen on image 59 of 71 total images. Portal vein is patent on color Doppler imaging with normal direction of blood flow towards the liver. Other: No ascites IMPRESSION: Echogenic liver suggest the a ptosis or background liver disease. Question of focal lesion in the RIGHT hepatic lobe, associated with serpiginous structures some of which fill with color Doppler imaging. In a patient with history of for a hereditary telangiectasia could consider a vascular anomaly. Dilated biliary tree is also considered. Contrasted imaging with either MRI or CT or even noncontrast imaging may be helpful for further evaluation particularly given the history of potential liver dysfunction. Focal area of abnormality suggested in the RIGHT hepatic lobe for which further evaluation with cross-sectional imaging may be helpful. These results will be called to the ordering clinician or representative by the Radiologist Assistant, and communication documented in the PACS or [employer]. Electronically Signed   By: Zetta Bills M.D.   On: 02/04/2021 11:28    Labs:  CBC: Recent Labs    01/30/21 1235 02/03/21 1043 02/03/21 1112 02/04/21 0454 02/05/21 0449  WBC 5.8 7.0  --  6.1 10.7*  HGB 9.2* 10.3* 9.9* 10.4* 7.1*  HCT 27.0* 32.1* 29.0* 31.3* 22.5*  PLT 125* 167  --  166 191    COAGS: Recent Labs    01/01/21 0633 01/18/21 0628 01/18/21 2231 02/04/21 0454  INR 1.1 1.1  --  1.1  APTT 30  --  30  --     BMP: Recent Labs    01/30/21 0321  02/03/21 1043 02/03/21 1112 02/04/21 0454 02/05/21 0449  NA 135 144 146* 143 141  K 3.7 5.1 5.0 3.8 4.2  CL 105 115*  --  114* 111  CO2 24 21*  --  19* 20*  GLUCOSE 340* 185*  --  193* 460*  BUN 20 18  --  11 20  CALCIUM 8.0* 9.4  --  9.3 8.2*  CREATININE 1.52* 1.64*  --  1.21* 2.16*  GFRNONAA 34* 31*  --  45* 23*    LIVER FUNCTION TESTS: Recent Labs    01/28/21 1943 02/03/21 1043 02/04/21 0454 02/05/21 0449  BILITOT 0.9 0.6 1.2 0.6  AST 22 30 30 19   ALT 16 19 18 13   ALKPHOS 107 102 105 75  PROT 6.3* 5.9* 5.6* 4.0*  ALBUMIN 3.1* 2.9* 2.9* 2.0*    Assessment and Plan: Patient familiar to IR service from Port-A-Cath placement in 2016.  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  In addition [name] has GERD, heart murmur, hypertension, hyperlipidemia, pulmonary hypertension, prior seizures, and diabetes.  [name] was recently admitted with altered mental status, AKI and elevated ammonia levels.  [name] is status post endoscopy on 01/19/2021 secondary to coffee-ground emesis and melena revealing:  normal esophagus. - Z-line regular, 35 cm from the incisors. - Oozing gastric ulcer with pigmented material. Injected. Clips were placed. - Gastritis. - Normal examined duodenum.  [name] now presents with recurrent hematemesis soft BPs.  [name] has received fluid boluses along with IV Protonix/Reglan and 2 units of  packed red cells.  ET chest abdomen pelvis performed earlier this morning revealed:  1. Contrast extravasation within the distal stomach adjacent to the hemostasis clip, consistent with recurrent active gastric hemorrhage from known ulcer. 2. Rounded left upper lobe consolidation, which has developed since 09/29/2020. Favor focal pneumonia over neoplasm. Chest x-ray follow-up in 6-8 weeks is recommended to ensure complete resolution and exclude underlying neoplasm. 3. Well-circumscribed 3.4 cm splenic mass, favor  hemangioma given decreased prominence on delayed imaging. 4. Hepatic steatosis. 5. Marked gastric distension and retained gastric contents  Current labs include WBC 10.7, hemoglobin 7.1, platelets normal, creatinine 2.16, PT/INR normal. Request now received for mesenteric/visceral arteriogram with embolization.  Imaging studies have been reviewed by Dr.Mir. Risks and benefits of  were discussed with the patient/spouse including, but not limited to bleeding, infection, vascular injury or contrast induced renal failure.  This interventional procedure involves the use of X-rays and because of the nature of the planned procedure, it is possible that we will have prolonged use of X-ray fluoroscopy.  Potential radiation risks to you include (but are not limited to) the following: - A slightly elevated risk for cancer  several years later in life. This risk is typically less than 0.5% percent. This risk is low in comparison to the normal incidence of human cancer, which is 33% for women and 50% for men according to the [employer]. - Radiation induced injury can include skin redness, resembling a rash, tissue breakdown / ulcers and hair loss (which can be temporary or permanent).   The likelihood of either of these occurring depends on the difficulty of the procedure and whether you are sensitive to radiation due to previous procedures, disease, or genetic conditions.   IF your procedure requires a prolonged use of radiation, you will be notified and given written instructions for further action.  It is your responsibility to monitor the irradiated area for the 2 weeks following the procedure and to notify your physician if you are concerned that you have suffered a radiation induced injury.    All of the patient's questions were answered, patient is agreeable to proceed.  Consent signed and in chart.  Procedure planned emergently for this morning.     Electronically Signed: D. Rowe Robert, PA-C 02/05/2021, 7:46 AM   I spent a total of 30 minutes at the the patient's bedside AND on the patient's hospital floor or unit, greater than 50% of which was counseling/coordinating care for visceral/mesenteric arteriogram with embolization    Patient ID: [name], female   DOB: [birth year], 80 y.o.   MRN: [id number]

## 2021-02-05 NOTE — Consult Note (Signed)
[facility] Consult  Referring Provider: Internal medicine teaching service Primary Care Physician:  Mike Craze, DO Primary Gastroenterologist: Recently seen by [facility] this month  Reason for Consultation: Vomiting blood  HPI: [name] is a 80 y.o. female with history of hereditary hemorrhagic telangiectasia, recently had an EGD on 01/19/2021, bleeding gastric ulcer treated with epinephrine and Endo Clip placement, admitted on 02/03/2021 with altered mental status. Patient lives at SNF[facility], spoke with her husband [name] Lapaglia over the phone, [phone], it appears she was transferred to the hospital with frequent episodes of unresponsiveness. Today morning at 6 AM she was found to be vomiting blood clots, was given Protonix bolus and drip and Reglan, found to have blood pressure of 80s, blood pressure improved to 120s after 1 L lactated Ringer's. IR was consulted as CAT scan showed recurrent active gastric hemorrhage from known ulcer. IR full procedure report pending, it seems she underwent mesenteric angiogram and left gastric embolization. Patient was examined in recovery area in IR, with normal hemodynamics. NG tube inserted but not connected to wall suction. Patient had burgundy liquid bowel movement post IR procedure.   Past Medical History:  Diagnosis Date   Chronic anemia    Chronic diastolic CHF (congestive heart failure) ([place]) 10/03/2013   Chronic GI bleeding    /notes 11/29/2014   Family history of anesthesia complication    "niece has a hard time coming out" (09/15/2012)   Frequent nosebleeds    chronic   Gastric AV malformation    /notes 11/29/2014   GERD (gastroesophageal reflux disease)    Heart murmur 04/01/2017   Moderate AVSC on echo 09/2016   History of blood transfusion "several"   History of epistaxis    HTN (hypertension), benign 03/02/2012   Hyperlipidemia    Iron deficiency anemia    chronic infusions"   Lichen planus    Both  lower extremities   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    Past Surgical History:  Procedure Laterality Date   CATARACT EXTRACTION     "I think it was just one eye"   ESOPHAGOGASTRODUODENOSCOPY  02/26/2011   Procedure: ESOPHAGOGASTRODUODENOSCOPY (EGD);  Surgeon: [name], MD;  Location: [name];  Service: Endoscopy;  Laterality: N/A;   ESOPHAGOGASTRODUODENOSCOPY N/A 11/08/2012   Procedure: ESOPHAGOGASTRODUODENOSCOPY (EGD);  Surgeon: [name], MD;  Location: [name];  Service: Endoscopy;  Laterality: N/A;   ESOPHAGOGASTRODUODENOSCOPY N/A 10/04/2013   Procedure: ESOPHAGOGASTRODUODENOSCOPY (EGD);  Surgeon: Winfield Cunas., MD;  Location: [name];  Service: Endoscopy;  Laterality: N/A;  with APC on stand-by   ESOPHAGOGASTRODUODENOSCOPY N/A 07/06/2014   Procedure: ESOPHAGOGASTRODUODENOSCOPY (EGD);  Surgeon: Clarene Essex, MD;  Location: [name];  Service: Endoscopy;  Laterality: N/A;   ESOPHAGOGASTRODUODENOSCOPY N/A 09/05/2014   Procedure: ESOPHAGOGASTRODUODENOSCOPY (EGD);  Surgeon: Laurence Spates, MD;  Location: [name];  Service: Endoscopy;  Laterality: N/A;  APC on standby to control bleeding   ESOPHAGOGASTRODUODENOSCOPY N/A 11/29/2014   Procedure: ESOPHAGOGASTRODUODENOSCOPY (EGD);  Surgeon: [name], MD;  Location: [facility] ENDOSCOPY;  Service: Endoscopy;  Laterality: N/A;   ESOPHAGOGASTRODUODENOSCOPY N/A 09/28/2015   Procedure: ESOPHAGOGASTRODUODENOSCOPY (EGD);  Surgeon:  Clarene Essex, MD;  Location: [facility] ENDOSCOPY;  Service: Endoscopy;  Laterality: N/A;   ESOPHAGOGASTRODUODENOSCOPY (EGD) WITH PROPOFOL N/A 12/04/2017   Procedure: ESOPHAGOGASTRODUODENOSCOPY (EGD)  WITH PROPOFOL;  Surgeon: [name], MD;  Location: [place];  Service: Endoscopy;  Laterality: N/A;   ESOPHAGOGASTRODUODENOSCOPY (EGD) WITH PROPOFOL N/A 12/24/2020   Procedure: ESOPHAGOGASTRODUODENOSCOPY (EGD) WITH PROPOFOL;  Surgeon: Arta Silence, MD;  Location: [place];  Service: Endoscopy;  Laterality: N/A;   ESOPHAGOGASTRODUODENOSCOPY (EGD) WITH PROPOFOL N/A 01/19/2021   Procedure: ESOPHAGOGASTRODUODENOSCOPY (EGD) WITH PROPOFOL;  Surgeon: [name], MD;  Location: [place];  Service: Endoscopy;  Laterality: N/A;   ESOPHAGOGASTRODUODENOSCOPY ENDOSCOPY  08/19/2006   with laser treatment   HEMOSTASIS CLIP PLACEMENT  12/24/2020   Procedure: HEMOSTASIS CLIP PLACEMENT;  Surgeon: Arta Silence, MD;  Location: [facility] ENDOSCOPY;  Service: Endoscopy;;   HEMOSTASIS CLIP PLACEMENT  01/19/2021   Procedure: HEMOSTASIS CLIP PLACEMENT;  Surgeon: [name], MD;  Location: [place];  Service: Endoscopy;;   HOT HEMOSTASIS N/A 07/06/2014   Procedure: HOT HEMOSTASIS (ARGON PLASMA COAGULATION/BICAP);  Surgeon: Clarene Essex, MD;  Location: [name];  Service: Endoscopy;  Laterality: N/A;   HOT HEMOSTASIS N/A 09/28/2015   Procedure: HOT HEMOSTASIS (ARGON PLASMA COAGULATION/BICAP);  Surgeon: Clarene Essex, MD;  Location: [facility] ENDOSCOPY;  Service: Endoscopy;  Laterality: N/A;   HOT HEMOSTASIS N/A 12/04/2017   Procedure: HOT HEMOSTASIS (ARGON PLASMA COAGULATION/BICAP);  Surgeon: [name], MD;  Location: [place];  Service: Endoscopy;  Laterality: N/A;   NASAL HEMORRHAGE CONTROL     "for bleeding"    SAVORY DILATION  02/26/2011   Procedure: SAVORY DILATION;  Surgeon: [name], MD;  Location: [name];  Service: Endoscopy;  Laterality: N/A;  c-arm needed   SCLEROTHERAPY  12/24/2020   Procedure: SCLEROTHERAPY;  Surgeon: Arta Silence, MD;  Location: [facility] ENDOSCOPY;  Service: Endoscopy;;   SCLEROTHERAPY  01/19/2021   Procedure: Clide Deutscher;  Surgeon: [name], MD;  Location: [facility] ENDOSCOPY;  Service: Endoscopy;;   SUBMUCOSAL INJECTION  12/04/2017   Procedure: SUBMUCOSAL INJECTION;  Surgeon: [name], MD;  Location: [place];  Service: Endoscopy;;    Prior to Admission medications   Medication Sig Start Date End Date Taking? Authorizing Provider  ACCU-CHEK GUIDE test strip USE AS INSTRUCTED TO CHECK BLOOD SUGAR 3 TIMES DAILY. Patient taking differently: 1 each by Other route in the morning, at noon, and at bedtime. 08/08/20  Yes Elayne Snare, MD  Accu-Chek Softclix Lancets lancets 1 each by Other route 3 (three) times daily. Use as instructed to check blood sugar 3 times per day dx code E11.65 02/01/20  Yes Elayne Snare, MD  amLODipine (NORVASC) 2.5 MG tablet Take 1 tablet (2.5 mg total) by mouth every morning. 01/15/21  Yes Eugenie Filler, MD  Continuous Blood Gluc Sensor (FREESTYLE LIBRE 2 SENSOR) MISC 2 Devices by Does not apply route every 14 (fourteen) days. 04/25/20  Yes Elayne Snare, MD  diclofenac Sodium (VOLTAREN) 1 % GEL Apply 2 g topically 4 (four) times daily. Patient taking differently: Apply 2 g topically 4 (four) times daily as needed (pain). 12/26/20  Yes Lacinda Axon, MD  gabapentin (NEURONTIN) 100 MG capsule TAKE 1 CAPSULE BY MOUTH TWICE DAILY Patient taking differently: Take 100 mg by mouth 2 (two) times daily. 08/16/20  Yes Katsadouros, Vasilios, MD  HUMALOG MIX 75/25 KWIKPEN (75-25) 100 UNIT/ML Kwikpen INJECT 10 UNITS SUBCUTANEOUSLY ONCE DAILY Patient taking differently: Inject 12 Units into the skin every morning. 04/17/20  Yes Elayne Snare, MD  insulin  lispro (HUMALOG KWIKPEN) 100 UNIT/ML KwikPen 3 units before dinner Patient taking differently: Inject 2 Units into the skin 3 (three) times daily. 10/03/20  Yes Elayne Snare, MD  insulin lispro (HUMALOG) 100 UNIT/ML injection Inject 3-10 Units into the skin 2 (two) times daily. 3 units once a day for diabetes and 10 units before dinner.   Yes [provider]  levETIRAcetam (KEPPRA) 500 MG tablet TAKE 1 TABLET BY MOUTH TWICE DAILY Patient taking differently: Take 500 mg by mouth 2 (two) times daily. 06/07/20  Yes [name], Areeg N, DO  memantine (NAMENDA) 10 MG tablet TAKE 1 TABLET BY MOUTH TWICE DAILY Patient taking differently: Take 10 mg by mouth 2 (two) times daily. 11/11/20  Yes [name], Areeg N, DO  Misc Natural Products (IMMUNE FORMULA PO) Take 1 tablet by mouth daily.   Yes [provider]  Multiple Vitamins-Minerals (MULTI FOR HER 50+) TABS Take 1 tablet by mouth daily with breakfast.   Yes [provider]  pantoprazole (PROTONIX) 40 MG tablet Take 1 tablet (40 mg total) by mouth 2 (two) times daily. 12/26/20  Yes Lacinda Axon, MD  potassium chloride SA (KLOR-CON M) 20 MEQ tablet Take 20 mEq by mouth 2 (two) times daily.   Yes [provider]  torsemide (DEMADEX) 20 MG tablet Take 1 tablet (20 mg total) by mouth as needed (Please take as needed for for lower extremity swelling OR weight gain >2-3lbs per day or >5lbs per week). 01/30/21  Yes Wayland Denis, MD  triamcinolone cream (KENALOG) 0.1 % Apply 1 application topically 2 (two) times daily.   Yes [provider]  TRULICITY 1.5 LY/6.5KP SOPN INJECT 1.5MG  SUBCUTANEOUSLY EVERY WEEK ON THURSDAY Patient taking differently: Inject 1.5 mg into the skin every Thursday. 07/29/20  Yes Elayne Snare, MD  triamcinolone ointment (KENALOG) 0.5 % APPLY 1 APPLICATION TOPICALLY TO RASH TWICE DAILY FOR ITCHING Patient not taking: Reported on 02/03/2021 08/16/20   Riesa Pope, MD    Current Facility-Administered Medications  Medication Dose Route Frequency Provider Last Rate Last Admin   0.9 %  sodium chloride infusion (Manually program via Guardrails IV Fluids)   Intravenous Once Lacinda Axon, MD       acetaminophen (TYLENOL) tablet 650 mg  650 mg Oral Q6H PRN Virl Axe, MD       Or   acetaminophen (TYLENOL) suppository 650 mg  650 mg Rectal Q6H  PRN Virl Axe, MD       cefTRIAXone (ROCEPHIN) 2 g in sodium chloride 0.9 % 100 mL IVPB  2 g Intravenous Q24H Aslam, Sadia, MD       enoxaparin (LOVENOX) injection 30 mg  30 mg Subcutaneous Q24H Pham, Minh Q, RPH-CPP       fentaNYL (SUBLIMAZE) 100 MCG/2ML injection            fentaNYL (SUBLIMAZE) injection   Intravenous PRN Mir, Paula Libra, MD   25 mcg at 02/05/21 0918   gabapentin (NEURONTIN) capsule 100 mg  100 mg Oral BID Wayland Denis, MD   100 mg at 02/04/21 2057   insulin aspart (novoLOG) injection 0-15 Units  0-15 Units Subcutaneous TID WC Virl Axe, MD   2 Units at 02/03/21 1622   insulin aspart (novoLOG) injection 0-5 Units  0-5 Units Subcutaneous QHS Virl Axe, MD   2 Units at 02/04/21 2056   insulin aspart (novoLOG) injection 10 Units  10 Units Subcutaneous Once Farrel Gordon, DO       lactated ringers infusion  Intravenous Continuous Farrel Gordon, DO       levETIRAcetam (KEPPRA) tablet 500 mg  500 mg Oral BID Wayland Denis, MD       lidocaine (XYLOCAINE) 1 % (with pres) injection            memantine (NAMENDA) tablet 10 mg  10 mg Oral BID Wayland Denis, MD   10 mg at 02/04/21 2058   midazolam (VERSED) 2 MG/2ML injection            midazolam (VERSED) injection   Intravenous PRN Mir, Paula Libra, MD   0.5 mg at 02/05/21 0939   ondansetron (ZOFRAN) tablet 4 mg  4 mg Oral Q6H PRN Virl Axe, MD       Or   ondansetron [facility]) injection 4 mg  4 mg Intravenous Q6H PRN Virl Axe, MD   4 mg at 02/05/21 0540   pantoprazole (PROTONIX) 80 mg /NS 100 mL IVPB  80 mg Intravenous Once Lacinda Axon, MD       Derrill Memo ON 02/08/2021] pantoprazole (PROTONIX) injection 40 mg  40 mg Intravenous Q12H Lacinda Axon, MD       pantoprozole (PROTONIX) 80 mg /NS 100 mL infusion  8 mg/hr Intravenous Continuous Lacinda Axon, MD 10 mL/hr at 02/05/21 0721 8 mg/hr at 02/05/21 0721   polyethylene glycol (MIRALAX / GLYCOLAX) packet 17 g  17 g Oral Daily PRN Virl Axe, MD        Facility-Administered Medications Ordered in Other Encounters  Medication Dose Route Frequency Provider Last Rate Last Admin   sodium chloride 0.9 % injection 10 mL  10 mL Intracatheter PRN Truitt Merle, MD       sodium chloride flush (NS) 0.9 % injection 10 mL  10 mL Intracatheter PRN Truitt Merle, MD   10 mL at 08/18/19 1717    Allergies as of 02/03/2021 - Review Complete 02/03/2021  Allergen Reaction Noted   Aspirin Nausea And Vomiting 05/12/2006    Family History  Problem Relation Age of Onset   Healthy Mother    Stroke Father    Diabetes Niece    Breast cancer Other    Malignant hyperthermia Neg Hx    Seizures Neg Hx     Social History   Socioeconomic History   Marital status: Married    Spouse name: [name]   Number of children: 0   Years of education: 9   Highest education level: Not on file  Occupational History   Occupation: Retired Systems developer: RETIRED  Tobacco Use   Smoking status: Former    Packs/day: 1.00    Years: 20.00    Pack years: 20.00    Types: Cigarettes    Quit date: 02/10/1971    Years since quitting: 50.0   Smokeless tobacco: Never   Tobacco comments:    09/15/2012 "smoked 50-60 yr ago"  Vaping Use   Vaping Use: Never used  Substance and Sexual Activity   Alcohol use: No    Alcohol/week: 0.0 standard drinks   Drug use: No   Sexual activity: Not on file  Other Topics Concern   Not on file  Social History Narrative   Married, lives with husband, [name].  Ambulates without assistance.     Caffeine use: none   Social Determinants of Radio broadcast assistant Strain: Not on file  Food Insecurity: Not on file  Transportation Needs: Not on file  Physical Activity: Not on file  Stress: Not on file  Social Connections: Not on file  Intimate Partner Violence: Not on file    Review of Systems:  As per HPI  Physical Exam: Vital signs in last 24 hours: Temp:  [98.9 F (37.2 C)] 98.9 F (37.2 C) (12/27 2034) Pulse Rate:   [69-96] 88 (12/28 1115) Resp:  [10-26] 20 (12/28 1115) BP: (81-147)/(40-86) 102/74 (12/28 1115) SpO2:  [96 %-100 %] 98 % (12/28 1115) Weight:  [31 kg] 64 kg (12/27 2034)    General:   Well-developed, well-nourished, pleasant and cooperative in NAD Head:  Normocephalic and atraumatic. Eyes:  Sclera clear, no icterus.   Mild pallor  ears:  Normal auditory acuity. Nose:  No deformity, discharge,  or lesions. Mouth:  No deformity or lesions.  Oropharynx pink & moist. Neck:  Supple; no masses or thyromegaly. Lungs:  Clear throughout to auscultation.   No wheezes, crackles, or rhonchi. No acute distress. Heart:  Regular rate and rhythm; no murmurs, clicks, rubs,  or gallops. Extremities:  Without clubbing or edema. Neurologic: Awake, is aware that she is in the hospital and is able to correctly state the year and month Skin:  Intact without significant lesions or rashes. Psych:  Alert and cooperative. Normal mood and affect. Abdomen:  Soft, nontender and nondistended. No masses, hepatosplenomegaly or hernias noted. Normal bowel sounds, without guarding, and without rebound.         Lab Results: Recent Labs    02/03/21 1043 02/03/21 1112 02/04/21 0454 02/05/21 0449  WBC 7.0  --  6.1 10.7*  HGB 10.3* 9.9* 10.4* 7.1*  HCT 32.1* 29.0* 31.3* 22.5*  PLT 167  --  166 191   BMET Recent Labs    02/03/21 1043 02/03/21 1112 02/04/21 0454 02/05/21 0449  NA 144 146* 143 141  K 5.1 5.0 3.8 4.2  CL 115*  --  114* 111  CO2 21*  --  19* 20*  GLUCOSE 185*  --  193* 460*  BUN 18  --  11 20  CREATININE 1.64*  --  1.21* 2.16*  CALCIUM 9.4  --  9.3 8.2*   LFT Recent Labs    02/05/21 0449  PROT 4.0*  ALBUMIN 2.0*  AST 19  ALT 13  ALKPHOS 75  BILITOT 0.6   PT/INR Recent Labs    02/04/21 0454  LABPROT 13.9  INR 1.1    Studies/Results: MR LIVER WO CONRTAST  Result Date: 02/04/2021 CLINICAL DATA:  A female at age 52 presents for evaluation of hepatic abnormalities  discovered on recent sonogram. EXAM: MRI ABDOMEN WITHOUT CONTRAST TECHNIQUE: Multiplanar multisequence MR imaging was performed without the administration of intravenous contrast. COMPARISON:  Previous chest x-rays and abdominal sonogram. FINDINGS: Lower chest: No sign of effusion. 2.2 x 2.3 cm nodular area in the LEFT upper chest suspicious for pulmonary nodule or mass, not well assessed on MR. Cardiac enlargement. RIGHT-sided Port-A-Cath, not well assessed. No effusion or consolidation. Hepatobiliary: Signs of iron deposition in the liver. Diffusely heterogeneous appearance of the liver with serpiginous areas of variable signal on T2, not well assessed. No gross intrahepatic biliary duct distension, not well assessed. Suggestion of steatotic changes as well as iron deposition in the liver. No pericholecystic stranding. Pancreas: Limited assessment of the pancreas, markedly limited by susceptibility artifact presumably from hemostatic clips placed during recent endoscopy. No gross pancreatic ductal dilation. Spleen:  Splenic iron deposition. Adrenals/Urinary Tract: Smooth renal contours. No hydronephrosis. Normal adrenal glands. Small cyst in the interpolar LEFT kidney. Stomach/Bowel: Susceptibility artifact about the stomach  obscures much of the LEFT hemi liver on many of the sequences acquired likely related to hemostatic clips placed during recent endoscopy. Similar artifact seen in the RIGHT lower quadrant potentially related to interval passage of some of these clip since previous imaging Vascular/Lymphatic: No adenopathy in the retroperitoneum. Vascular structures not well assessed given lack of intravenous contrast. No signs of aneurysmal dilation of the abdominal aorta. Other:  No ascites. Musculoskeletal: Iron deposition in marrow spaces. Heterogeneous marrow signal and many areas in some areas suggestive of vertebral hemangiomata, in other areas more compatible with degenerative endplate changes. Overall  not well assessed. IMPRESSION: Signs of iron deposition in the liver, spleen and marrow spaces. Likely due to exogenous administration 2.2 x 2.3 cm nodular area in the LEFT upper chest suspicious for pulmonary nodule or mass, not well assessed on MR. Given suspicion for pulmonary neoplasm and other findings seen on previous and current imaging would suggest CT of the chest, abdomen and pelvis, would suggest contrast for optimum assessment as allowed by renal function. Serpiginous areas of T2 hyperintensity favored to represent prominent vasculature in the liver perhaps due to underlying shunt lesions. No discrete hepatic lesion is seen on noncontrast imaging. Variable signal in the spleen raising the question of underlying splenic lesion. Suggest attention on dedicated abdominal imaging. Hemostatic clips causing substantial susceptibility artifact in the upper abdomen. Due to this and the patient's inability to hold their breath contrast was not administered. These results were called by telephone at the time of interpretation on 02/04/2021 at 5:31 pm to provider Dr. Lisabeth Devoid, Who verbally acknowledged these results. Electronically Signed   By: Zetta Bills M.D.   On: 02/04/2021 17:32   CT CHEST ABDOMEN PELVIS W CONTRAST  Result Date: 02/05/2021 CLINICAL DATA:  Left lung lesion, metastatic workup EXAM: CT CHEST, ABDOMEN, AND PELVIS WITH CONTRAST TECHNIQUE: Multidetector CT imaging of the chest, abdomen and pelvis was performed following the standard protocol during bolus administration of intravenous contrast. CONTRAST:  70mL OMNIPAQUE IOHEXOL 300 MG/ML  SOLN COMPARISON:  02/04/2021, 01/28/2021, 12/31/2020, 09/29/2020 FINDINGS: CT CHEST FINDINGS Cardiovascular: Heart is unremarkable without pericardial effusion. No evidence of thoracic aortic aneurysm or dissection. Mild atherosclerosis. Mediastinum/Nodes: No enlarged mediastinal, hilar, or axillary lymph nodes. Thyroid gland, trachea, and esophagus demonstrate  no significant findings. Lungs/Pleura: Rounded area of consolidation within the left upper lobe measures 2.2 x 1.6 by 1.7 cm, with slightly spiculated margins. This is most consistent with focal pneumonia given the interval development since 09/29/2020. Follow-up chest x-ray recommended in 6-8 weeks after completion of appropriate medical management to assess for interval resolution and exclude underlying neoplasm. No effusion or pneumothorax.  Central airways are patent. Musculoskeletal: No acute or destructive bony lesions. Reconstructed images demonstrate no additional findings. CT ABDOMEN PELVIS FINDINGS Hepatobiliary: Hepatic steatosis. Ectatic vasculature within the right lobe liver corresponding to the previous MRI and ultrasound findings. No discrete liver mass. Gallbladder is unremarkable. Pancreas: Unremarkable. No pancreatic ductal dilatation or surrounding inflammatory changes. Spleen: Well-circumscribed 3.4 x 2.9 cm splenic mass corresponds to the signal abnormality on previous MRI. Decreased prominence on delayed imaging suggests underlying hemangioma. Heterogeneity throughout the remainder of the spleen consistent with iron deposition reported on MRI. Adrenals/Urinary Tract: Adrenal glands are unremarkable. Kidneys are normal, without renal calculi, focal lesion, or hydronephrosis. Bladder is unremarkable. Stomach/Bowel: No bowel obstruction or ileus. Normal appendix right lower quadrant. Hemostasis clip is seen within the gastric antrum. Adjacent to the clip, there is contrast extravasation consistent with active gastric hemorrhage. Stomach  is distended, with retained gastric contents. Hemostasis clip within the proximal colon may reflect migrated clip from prior endoscopy. Vascular/Lymphatic: No significant vascular findings are present. No enlarged abdominal or pelvic lymph nodes. Reproductive: Multiple uterine fibroids.  No adnexal masses. Other: No free fluid or free intraperitoneal gas. No  abdominal wall hernia. Musculoskeletal: No acute or destructive bony lesions. Reconstructed images demonstrate no additional findings. IMPRESSION: 1. Contrast extravasation within the distal stomach adjacent to the hemostasis clip, consistent with recurrent active gastric hemorrhage from known ulcer. 2. Rounded left upper lobe consolidation, which has developed since 09/29/2020. Favor focal pneumonia over neoplasm. Chest x-ray follow-up in 6-8 weeks is recommended to ensure complete resolution and exclude underlying neoplasm. 3. Well-circumscribed 3.4 cm splenic mass, favor hemangioma given decreased prominence on delayed imaging. 4. Hepatic steatosis. 5. Marked gastric distension and retained gastric contents. Critical Value/emergent results were called by telephone at the time of interpretation on 02/05/2021 at 3:51 am to provider DR [facility], who verbally acknowledged these results. Electronically Signed   By: Randa Ngo M.D.   On: 02/05/2021 03:51   US Abdomen Limited RUQ (LIVER/GB)  Result Date: 02/04/2021 CLINICAL DATA:  In a 80 year old female presents with abnormal liver function and elevated ammonia levels. EXAM: ULTRASOUND ABDOMEN LIMITED RIGHT UPPER QUADRANT COMPARISON:  Only renal sonogram from 2016 is available for comparison. CT of the abdomen and pelvis, remote evaluation from 2006 also noted. FINDINGS: Gallbladder: Small amount of sludge in the dependent gallbladder. No wall thickening or pericholecystic fluid. No reported tenderness over the gallbladder. Common bile duct: Diameter: 2.9 mm Liver: Echogenic liver. Area of more pronounced echogenicity in the RIGHT hepatic lobe (image 59. Central serpiginous anechoic structures on grayscale imaging some of which do not appear to fill with color Doppler some of which do fill more centrally, extending to the periphery these dilated structures are noted also seen on image 59 of 71 total images. Portal vein is patent on color Doppler imaging with  normal direction of blood flow towards the liver. Other: No ascites IMPRESSION: Echogenic liver suggest the a ptosis or background liver disease. Question of focal lesion in the RIGHT hepatic lobe, associated with serpiginous structures some of which fill with color Doppler imaging. In a patient with history of for a hereditary telangiectasia could consider a vascular anomaly. Dilated biliary tree is also considered. Contrasted imaging with either MRI or CT or even noncontrast imaging may be helpful for further evaluation particularly given the history of potential liver dysfunction. Focal area of abnormality suggested in the RIGHT hepatic lobe for which further evaluation with cross-sectional imaging may be helpful. These results will be called to the ordering clinician or representative by the Radiologist Assistant, and communication documented in the PACS or [employer]. Electronically Signed   By: Zetta Bills M.D.   On: 02/04/2021 11:28    Impression: Hematemesis and melena with active bleeding gastric ulcer noted on CT treated with IR guided embolization Hemoglobin was 10.4 yesterday, has dropped to 7.1 today  Normal PT/INR   Creatinine 2.16 with a GFR of 33 Hypoalbuminemia, albumin 2 Mildly acidotic, bicarb 20 Elevated blood sugar 460 Lactic acid 2.7 Elevated ammonia of 188, no evidence of cirrhosis noted on CAT scan, possible vascular anomaly noted in liver ultrasound   Plan: Keep patient n.p.o. Continue IV Protonix 40 mg every 12 hours Plan is to transfuse 2 unit PRBC Monitor hemodynamics Hopefully with IR guided embolization there will be no further bleeding If hemoglobin drops despite transfusion or  if patient becomes hemodynamically stable or there is evidence of ongoing melena/hematemesis, we can consider endoscopy thereafter.   LOS: 1 day   Ronnette Juniper, MD  02/05/2021, 11:24 AM

## 2021-02-05 NOTE — Sedation Documentation (Signed)
GI MD in to see pt

## 2021-02-05 NOTE — Sedation Documentation (Signed)
Patient is resting comfortably. Procedure continues 

## 2021-02-05 NOTE — Progress Notes (Signed)
Tried to give report at this this, phone hang up.Will call later.

## 2021-02-05 NOTE — Consult Note (Signed)
Consultation Note Date: 02/05/2021   Patient Name: [name]  DOB: 09/23/1940  MRN: [id number]  Age / Sex: 80 y.o., female  PCP: Mike Craze, DO Referring Physician: Velna Ochs, MD  Reason for Consultation: Establishing goals of care  HPI/Patient Profile: 80 y.o. female  with past medical history of dementia, epilepsy, diastolic heart failure, hypertension, hyperlipidemia, iron deficiency anemia, pulmonary hypertension, hereditary hemorrhagic telangiectasias, gastric AVMs, GIB, CKD stage 3a, diabetes admitted on 02/03/2021 with altered mental status and AKI with recent admission with similar presentation and some improvement with IV fluids. Concern for chronically elevated ammonia levels and concern for potential liver disease - work up is ongoing.   Clinical Assessment and Goals of Care: I met today initially with Mr. Bowery and joined by niece, Mariann Laster. I returned later to speak again with Mr. Selden and goddaughter Lawerance Bach (also with niece [name] via telephone). Mr. And Mrs. Hunsucker have no children of their own but they have a close family and support system. Niece, [name], and goddaughter, Lawerance Bach, are main help at home. We reviewed Ms. Leoni's current condition status post IR embolization but concerns for ongoing bleeding and she is currently receiving blood. She is very sleepy and lethargic. I reviewed with them lab work including kidney function, ammonia levels, CBC. We discussed MRI with concern for lung nodule. I expressed concern for Ms. Boothe's status with complicating conditions that could lead to further decline. I spoke with them about the importance of preparing ourselves if she were to get worse instead of better despite our interventions. I discussed with them code status and Mr. Montini did express understanding from Dr. Marva Panda that resuscitative efforts would not be recommended as we do  not feel that she would survive these efforts. Family seems very shocked and surprised that we do not feel resuscitation would be in Ms. Bomar's best interest. I explained that we always have to consider the risks vs benefits of what we ask patients to go through. They express understanding. They do ask if we are saying that there is nothing more we can do and I explained that we are doing everything we can to prevent her from getting to the point of resuscitation but at some point our interventions become very limited and sometimes the body does not respond to our interventions. Our bodies can only go through so much. At this point they request full code, full scope.   They did have questions about obtaining POA and I took them Living Will and MOST form. After discussion they were explaining that the nursing facility said they needed POA since she was going to need to transition to long term care and this was more financial POA which I explained that the hospital unfortunately does not provide. I further explained that for any of these documents to be put in place the patient has to be awake, alert, oriented enough to sign to make it legally binding.   All questions/concerns addressed to best of my ability. Emotional support provided.   Primary  Decision Maker NEXT OF KIN husband (with support from [place])    SUMMARY OF RECOMMENDATIONS   - Full code, full scope - Will follow for support and ongoing conversations  Code Status/Advance Care Planning: Full code   Symptom Management:  Per attending, GI.   Palliative Prophylaxis:  Bowel Regimen, Delirium Protocol, Frequent Pain Assessment, Oral Care, and Turn Reposition  Psycho-social/Spiritual:  Desire for further Chaplaincy support:no - supported by their community church and pastor Additional Recommendations: Caregiving  Support/Resources and Grief/Bereavement Support  Prognosis:  Overall prognosis poor given multiple  complicating comorbidities, recurrent bleeding.    Discharge Planning: To Be Determined      Primary Diagnoses: Present on Admission:  Altered mental status  AMS (altered mental status)  GI bleed   I have reviewed the medical record, interviewed the patient and family, and examined the patient. The following aspects are pertinent.  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   Social History   Socioeconomic History   Marital status: Married    Spouse name: [name]   Number of children: 0   Years of education: 9   Highest education level: Not on file  Occupational History   Occupation: Retired Systems developer: RETIRED  Tobacco Use   Smoking status: Former    Packs/day: 1.00    Years: 20.00    Pack years: 20.00    Types: Cigarettes    Quit date: 02/10/1971    Years since quitting: 50.0   Smokeless tobacco: Never   Tobacco comments:    09/15/2012 "smoked 50-60 yr ago"  Vaping Use   Vaping Use: Never used  Substance and  Sexual Activity   Alcohol use: No    Alcohol/week: 0.0 standard drinks   Drug use: No   Sexual activity: Not on file  Other Topics Concern   Not on file  Social History Narrative   Married, lives with husband, [name].  Ambulates without assistance.     Caffeine use: none   Social Determinants of Radio broadcast assistant Strain: Not on file  Food Insecurity: Not on file  Transportation Needs: Not on file  Physical Activity: Not on file  Stress: Not on file  Social Connections: Not on file   Family History  Problem Relation Age of Onset   Healthy Mother    Stroke Father  Diabetes Niece    Breast cancer Other    Malignant hyperthermia Neg Hx    Seizures Neg Hx    Scheduled Meds:  sodium chloride   Intravenous Once   enoxaparin (LOVENOX) injection  30 mg Subcutaneous Q24H   fentaNYL       gabapentin  100 mg Oral BID   insulin aspart  0-15 Units Subcutaneous TID WC   insulin aspart  0-5 Units Subcutaneous QHS   insulin aspart  10 Units Subcutaneous Once   levETIRAcetam  500 mg Oral BID   lidocaine       memantine  10 mg Oral BID   midazolam       [START ON 02/08/2021] pantoprazole  40 mg Intravenous Q12H   Continuous Infusions:  cefTRIAXone (ROCEPHIN)  IV     lactated ringers     pantoprazole     pantoprazole 8 mg/hr (02/05/21 0721)   PRN Meds:.acetaminophen **OR** acetaminophen, fentaNYL, midazolam, ondansetron **OR** ondansetron (ZOFRAN) IV, polyethylene glycol Allergies  Allergen Reactions   Aspirin Nausea And Vomiting   Review of Systems  Unable to perform ROS: Acuity of condition   Physical Exam Vitals and nursing note reviewed.  Constitutional:      General: She is sleeping. She is not in acute distress.    Appearance: She is ill-appearing.  Cardiovascular:     Rate and Rhythm: Normal rate.  Pulmonary:     Effort: No tachypnea, accessory muscle usage or respiratory distress.  Abdominal:     Palpations: Abdomen is soft.  Neurological:     Mental  Status: She is lethargic.     Comments: Does open eyes occasionally but only for a few seconds. She is able to name her niece "Mariann Laster" at bedside when asked but does not participate in our conversation although she is present.     Vital Signs: BP (!) 120/45    Pulse 89    Temp 98.9 F (37.2 C)    Resp 16    Ht $R'5\' 3"'yR$  (1.6 m)    Wt 64 kg    LMP  (LMP Unknown)    SpO2 97%    BMI 24.99 kg/m  Pain Scale: 0-10   Pain Score: 0-No pain   SpO2: SpO2: 97 % O2 Device:SpO2: 97 % O2 Flow Rate: .O2 Flow Rate (L/min): 2 L/min  IO: Intake/output summary:  Intake/Output Summary (Last 24 hours) at 02/05/2021 1132 Last data filed at 02/05/2021 1108 Gross per 24 hour  Intake 420.87 ml  Output 251 ml  Net 169.87 ml    LBM:   Baseline Weight: Weight: 59 kg Most recent weight: Weight: 64 kg     Palliative Assessment/Data:     Time Total: 80 min  Greater than 50%  of this time was spent counseling and coordinating care related to the above assessment and plan.  Signed by: Vinie Sill, NP Palliative Medicine Team Pager # [phone] (M-F 8a-5p) Team Phone # [phone] (Nights/Weekends)

## 2021-02-05 NOTE — Progress Notes (Signed)
[facility] ([facility]) Community Based Palliative Care     [name] is currently enrolled in our palliative care services in the community.  [facility] will continue to follow for any discharge planning needs and to coordinate continuation of palliative care.   If you have questions or need assistance, please call [phone] or contact the hospital Liaison listed on AMION.      Clementeen Hoof, RN, BSN [facility] Liaison  [phone]  [phone]

## 2021-02-05 NOTE — Progress Notes (Signed)
°   02/05/21 1310  Assess: MEWS Score  Temp 98.3 F (36.8 C)  BP (!) 130/49  Pulse Rate 88  ECG Heart Rate 89  Resp (!) 22  Level of Consciousness Responds to Voice  SpO2 97 %  O2 Device Room Air  Patient Activity (if Appropriate) In bed  Assess: MEWS Score  MEWS Temp 0  MEWS Systolic 0  MEWS Pulse 0  MEWS RR 1  MEWS LOC 1  MEWS Score 2  MEWS Score Color Yellow  Assess: if the MEWS score is Yellow or Red  Were vital signs taken at a resting state? Yes  Focused Assessment No change from prior assessment  Early Detection of Sepsis Score *See Row Information* Low  MEWS guidelines implemented *See Row Information* No, vital signs rechecked  Treat  Pain Scale Faces  Pain Score 0  Take Vital Signs  Increase Vital Sign Frequency  Yellow: Q 2hr X 2 then Q 4hr X 2, if remains yellow, continue Q 4hrs  Escalate  MEWS: Escalate Yellow: discuss with charge nurse/RN and consider discussing with provider and RRT  Notify: Charge Nurse/RN  Name of Charge Nurse/RN Notified Erasmo Downer  Date Charge Nurse/RN Notified 02/05/21  Time Charge Nurse/RN Notified 1330

## 2021-02-06 ENCOUNTER — Inpatient Hospital Stay (HOSPITAL_COMMUNITY): Payer: Medicare Other | Admitting: Anesthesiology

## 2021-02-06 ENCOUNTER — Encounter (HOSPITAL_COMMUNITY)
Admission: EM | Disposition: A | Discharge status: Skilled Nursing Facility | Payer: Self-pay | Source: Skilled Nursing Facility | Attending: Internal Medicine

## 2021-02-06 DIAGNOSIS — K922 Gastrointestinal hemorrhage, unspecified: Secondary | ICD-10-CM

## 2021-02-06 DIAGNOSIS — R4182 Altered mental status, unspecified: Secondary | ICD-10-CM | POA: Diagnosis not present

## 2021-02-06 HISTORY — PX: ESOPHAGOGASTRODUODENOSCOPY (EGD) WITH PROPOFOL: SHX5813

## 2021-02-06 LAB — BPAM RBC
Blood Product Expiration Date: 202301132359
Blood Product Expiration Date: 202301172359
ISSUE DATE / TIME: 202212281432
ISSUE DATE / TIME: 202212281846
Unit Type and Rh: 6200
Unit Type and Rh: 6200

## 2021-02-06 LAB — COMPREHENSIVE METABOLIC PANEL
ALT: 14 U/L (ref 0–44)
AST: 33 U/L (ref 15–41)
Albumin: 2.1 g/dL — ABNORMAL LOW (ref 3.5–5.0)
Alkaline Phosphatase: 64 U/L (ref 38–126)
Anion gap: 5 (ref 5–15)
BUN: 25 mg/dL — ABNORMAL HIGH (ref 8–23)
CO2: 24 mmol/L (ref 22–32)
Calcium: 8.3 mg/dL — ABNORMAL LOW (ref 8.9–10.3)
Chloride: 119 mmol/L — ABNORMAL HIGH (ref 98–111)
Creatinine, Ser: 2.03 mg/dL — ABNORMAL HIGH (ref 0.44–1.00)
GFR, Estimated: 24 mL/min — ABNORMAL LOW (ref >60)
Glucose, Bld: 124 mg/dL — ABNORMAL HIGH (ref 70–99)
Potassium: 3.7 mmol/L (ref 3.5–5.1)
Sodium: 148 mmol/L — ABNORMAL HIGH (ref 135–145)
Total Bilirubin: 0.9 mg/dL (ref 0.3–1.2)
Total Protein: 4 g/dL — ABNORMAL LOW (ref 6.5–8.1)

## 2021-02-06 LAB — CBC
HCT: 29.5 % — ABNORMAL LOW (ref 36.0–46.0)
Hemoglobin: 10.1 g/dL — ABNORMAL LOW (ref 12.0–15.0)
MCH: 31.5 pg (ref 26.0–34.0)
MCHC: 34.2 g/dL (ref 30.0–36.0)
MCV: 91.9 fL (ref 80.0–100.0)
Platelets: 130 10*3/uL — ABNORMAL LOW (ref 150–400)
RBC: 3.21 MIL/uL — ABNORMAL LOW (ref 3.87–5.11)
RDW: 15.3 % (ref 11.5–15.5)
WBC: 10.3 10*3/uL (ref 4.0–10.5)
nRBC: 0.2 % (ref 0.0–0.2)

## 2021-02-06 LAB — TYPE AND SCREEN
ABO/RH(D): A POS
Antibody Screen: NEGATIVE
Unit division: 0
Unit division: 0

## 2021-02-06 LAB — GLUCOSE, CAPILLARY
Glucose-Capillary: 61 mg/dL — ABNORMAL LOW (ref 70–99)
Glucose-Capillary: 137 mg/dL — ABNORMAL HIGH (ref 70–99)
Glucose-Capillary: 66 mg/dL — ABNORMAL LOW (ref 70–99)
Glucose-Capillary: 154 mg/dL — ABNORMAL HIGH (ref 70–99)
Glucose-Capillary: 143 mg/dL — ABNORMAL HIGH (ref 70–99)
Glucose-Capillary: 167 mg/dL — ABNORMAL HIGH (ref 70–99)
Glucose-Capillary: 297 mg/dL — ABNORMAL HIGH (ref 70–99)
Glucose-Capillary: 389 mg/dL — ABNORMAL HIGH (ref 70–99)
Glucose-Capillary: 299 mg/dL — ABNORMAL HIGH (ref 70–99)

## 2021-02-06 LAB — HEMOGLOBIN AND HEMATOCRIT, BLOOD
HCT: 29.6 % — ABNORMAL LOW (ref 36.0–46.0)
Hemoglobin: 9.8 g/dL — ABNORMAL LOW (ref 12.0–15.0)

## 2021-02-06 SURGERY — ESOPHAGOGASTRODUODENOSCOPY (EGD) WITH PROPOFOL
Anesthesia: Monitor Anesthesia Care

## 2021-02-06 MED ORDER — DEXTROSE 50 % IV SOLN
12.5000 g | INTRAVENOUS | Status: AC
  Administered 2021-02-06: 05:00:00 12.5 g via INTRAVENOUS
  Filled 2021-02-06: qty 50

## 2021-02-06 MED ORDER — ONDANSETRON HCL 4 MG/2ML IJ SOLN: 4.0000 mg | Freq: Once | INTRAMUSCULAR | Status: DC | PRN

## 2021-02-06 MED ORDER — POTASSIUM CHLORIDE CRYS ER 20 MEQ PO TBCR
20.0000 meq | EXTENDED_RELEASE_TABLET | Freq: Two times a day (BID) | ORAL | Status: DC
  Administered 2021-02-06 – 2021-02-11 (×12): 20 meq via ORAL
  Filled 2021-02-06 (×12): qty 1

## 2021-02-06 MED ORDER — DEXTROSE 50 % IV SOLN
12.5000 g | INTRAVENOUS | Status: AC
  Administered 2021-02-06: 02:00:00 12.5 g via INTRAVENOUS
  Filled 2021-02-06: qty 50

## 2021-02-06 MED ORDER — ACCU-CHEK SOFTCLIX LANCETS MISC: 1.0000 | Freq: Three times a day (TID) | Status: DC

## 2021-02-06 MED ORDER — SODIUM CHLORIDE 0.9% FLUSH: 10.0000 mL | INTRAVENOUS | Status: DC | PRN

## 2021-02-06 MED ORDER — PROPOFOL 500 MG/50ML IV EMUL
INTRAVENOUS | Status: DC | PRN
  Administered 2021-02-06: 75 ug/kg/min via INTRAVENOUS

## 2021-02-06 MED ORDER — INSULIN ASPART PROT & ASPART (70-30 MIX) 100 UNIT/ML ~~LOC~~ SUSP
12.0000 [IU] | Freq: Every day | SUBCUTANEOUS | Status: DC
  Filled 2021-02-06: qty 10

## 2021-02-06 MED ORDER — CHLORHEXIDINE GLUCONATE CLOTH 2 % EX PADS
6.0000 | MEDICATED_PAD | Freq: Every day | CUTANEOUS | Status: DC
  Administered 2021-02-06 – 2021-02-12 (×7): 6 via TOPICAL

## 2021-02-06 MED ORDER — PANTOPRAZOLE SODIUM 40 MG PO TBEC
40.0000 mg | DELAYED_RELEASE_TABLET | Freq: Two times a day (BID) | ORAL | Status: DC
  Administered 2021-02-06 – 2021-02-12 (×13): 40 mg via ORAL
  Filled 2021-02-06 (×13): qty 1

## 2021-02-06 MED ORDER — INSULIN ASPART 100 UNIT/ML IJ SOLN
0.0000 [IU] | Freq: Three times a day (TID) | INTRAMUSCULAR | Status: DC
  Administered 2021-02-06: 19:00:00 5 [IU] via SUBCUTANEOUS
  Administered 2021-02-06: 14:00:00 2 [IU] via SUBCUTANEOUS
  Administered 2021-02-07: 13:00:00 3 [IU] via SUBCUTANEOUS
  Administered 2021-02-07: 08:00:00 2 [IU] via SUBCUTANEOUS
  Administered 2021-02-07 – 2021-02-08 (×2): 3 [IU] via SUBCUTANEOUS
  Administered 2021-02-08: 2 [IU] via SUBCUTANEOUS
  Administered 2021-02-08 – 2021-02-09 (×2): 5 [IU] via SUBCUTANEOUS
  Administered 2021-02-09: 2 [IU] via SUBCUTANEOUS
  Administered 2021-02-09: 3 [IU] via SUBCUTANEOUS
  Administered 2021-02-10: 2 [IU] via SUBCUTANEOUS
  Administered 2021-02-11: 5 [IU] via SUBCUTANEOUS
  Administered 2021-02-11: 2 [IU] via SUBCUTANEOUS
  Administered 2021-02-11: 3 [IU] via SUBCUTANEOUS

## 2021-02-06 MED ORDER — INSULIN ASPART 100 UNIT/ML IJ SOLN
2.0000 [IU] | Freq: Once | INTRAMUSCULAR | Status: AC
Start: 2021-02-07 — End: 2021-02-07
  Administered 2021-02-07: 2 [IU] via SUBCUTANEOUS

## 2021-02-06 MED ORDER — PANTOPRAZOLE SODIUM 40 MG PO TBEC: 40.0000 mg | DELAYED_RELEASE_TABLET | Freq: Two times a day (BID) | ORAL | Status: DC

## 2021-02-06 MED ORDER — FREESTYLE LIBRE 2 SENSOR MISC: 2.0000 | Status: DC

## 2021-02-06 MED ORDER — SODIUM CHLORIDE 0.9% FLUSH
10.0000 mL | Freq: Two times a day (BID) | INTRAVENOUS | Status: DC
Start: 2021-02-06 — End: 2021-02-12
  Administered 2021-02-06 – 2021-02-11 (×9): 10 mL
  Administered 2021-02-11: 20 mL
  Administered 2021-02-12: 10 mL

## 2021-02-06 MED ORDER — SODIUM CHLORIDE 0.9 % IV SOLN
INTRAVENOUS | Status: AC | PRN
  Administered 2021-02-06: 500 mL via INTRAVENOUS

## 2021-02-06 MED ORDER — ADULT MULTIVITAMIN W/MINERALS CH
1.0000 | ORAL_TABLET | Freq: Every day | ORAL | Status: DC
  Administered 2021-02-06 – 2021-02-12 (×7): 1 via ORAL
  Filled 2021-02-06 (×8): qty 1

## 2021-02-06 MED ORDER — FENTANYL CITRATE (PF) 100 MCG/2ML IJ SOLN: 25.0000 ug | INTRAMUSCULAR | Status: DC | PRN

## 2021-02-06 MED ORDER — DEXTROSE 50 % IV SOLN
INTRAVENOUS | Status: AC
  Filled 2021-02-06: qty 50

## 2021-02-06 MED ORDER — LACTATED RINGERS IV SOLN: INTRAVENOUS | Status: DC

## 2021-02-06 SURGICAL SUPPLY — 15 items

## 2021-02-06 NOTE — Transfer of Care (Signed)
Immediate Anesthesia Transfer of Care Note  Patient: [name]  Procedure(s) Performed: ESOPHAGOGASTRODUODENOSCOPY (EGD) WITH PROPOFOL  Patient Location: Endoscopy Unit  Anesthesia Type:MAC  Level of Consciousness: awake, alert  and sedated  Airway & Oxygen Therapy: Patient connected to nasal cannula oxygen  Post-op Assessment: Post -op Vital signs reviewed and stable  Post vital signs: stable  Last Vitals:  Vitals Value Taken Time  BP    Temp    Pulse    Resp    SpO2      Last Pain:  Vitals:   02/06/21 0820  TempSrc:   PainSc: 0-No pain         Complications: No notable events documented.

## 2021-02-06 NOTE — Progress Notes (Signed)
VAST RN assessed PIV's. Patient reports pain in both. Patient has PAC that is unaccessed.Spoke with Sharyn Lull who will contact provider to inquire about accessing PAC.   RN will place new consult for PIV or PAC if provider agrees.  Marcina Kinnison Lorita Officer, RN

## 2021-02-06 NOTE — Progress Notes (Signed)
[facility] ([facility])  Hospital Liaison: RN note         This patient has been referred to our palliative care services in the community.  [facility] will continue to follow for any discharge planning needs and to coordinate continuation of palliative care in the outpatient setting.    If you have questions or need assistance, please call [phone] or contact the hospital Liaison listed on AMION.      Thank you for this referral.         Mary Anne Robertson, RN, CCM  [facility] Hospital Liaison   [phone] 

## 2021-02-06 NOTE — Progress Notes (Addendum)
8208 Hypoglycemic Event  CBG: 66 mg/dL  Treatment: D50 25 mL (12.5 gm)  Symptoms: None  Follow-up CBG: Time:0534 CBG Result:154 mg/dL  Possible Reasons for Event: Other  Comments/MD notified: Notified Erick Colace  RN will continue to monitor pt.    Jorge Mandril  1388 Hypoglycemic Event  CBG: 61mg /dL  Treatment: D50 25 mL (12.5 gm)  Symptoms: None  Follow-up CBG Time:  0203     CBG Result:137 mg/dL  Possible Reasons for Event: other  Comments/MD notified: Notified Erick Colace  RN will continue to monitor pt.    Jorge Mandril

## 2021-02-06 NOTE — Progress Notes (Addendum)
[name] is a 80 y.o. female s/p mesenteric angiogram with coil embolization of left gastric artery with Dr. Dwaine Gale yesterday.   Positive dressing on R CFA puncture site. Site is unremarkable with no erythema, edema, tenderness, bleeding or drainage. Minimal amount of old, dry blood noted on the dressing. Dressing otherwise clean, dry, and intact.  DP dopplerable bilaterally  Hgb stable today, VSS RF stable   Please call IR for questions and concerns.   Armando Gang Yoon Barca PA-C 02/06/2021 11:44 AM

## 2021-02-06 NOTE — Progress Notes (Signed)
Palliative:  HPI: 80 y.o. female  with past medical history of dementia, epilepsy, diastolic heart failure, hypertension, hyperlipidemia, iron deficiency anemia, pulmonary hypertension, hereditary hemorrhagic telangiectasias, gastric AVMs, GIB, CKD stage 3a, diabetes admitted on 02/03/2021 with altered mental status and AKI with recent admission with similar presentation and some improvement with IV fluids. Concern for chronically elevated ammonia levels and concern for potential liver disease - work up is ongoing. MRI with evidence of hemochromatosis.   I met today with [name]. She is more awake but very tired and still not alert, awake enough for conversation. She does ask about her condition. I was joined by Dr. Marlou Sa and Dr. Philipp Ovens at bedside.   I reached out to speak further with niece, [name]. I discussed with her risk of bleeding and concern for hemachromatosis on MRI which can be effecting her other organs and also the need to be careful with ongoing blood transfusions. We reviewed EGD results with no signs of active bleeding but trauma likely from NGT placement. We further discussed Ms. Babino's overall poor prognosis and concern that she will have a bleeding episode or another complication that we will not be able to reverse. I discussed further with [name] the importance of ongoing consideration and family discussion regarding code status. I fear that Ms. Mathew would not survive such measures and I would hate to see her go through interventions that will cause her further pain and suffering. Denise verbalizes understanding and they are working to support Mr. Parks. She shares the difficulty he has making these decisions given how long they have been married and they have no children of their own - they are everything to each other. I expressed understanding and encouraged them to keep talking and praying about what is best for Ms. Grayson.   All questions/concerns addressed. Emotional  support provided.   Exam: Sleepy. No distress. HR RRR. Breathing regular, unlabored. Abd soft.   Plan: - Ongoing goals of care conversation.   25 min  Vinie Sill, NP Palliative Medicine Team Pager [phone] (Please see [email or web address] for schedule) Team Phone [phone]    Greater than 50%  of this time was spent counseling and coordinating care related to the above assessment and plan

## 2021-02-06 NOTE — Progress Notes (Signed)
Inpatient Diabetes Program Recommendations  AACE/ADA: New Consensus Statement on Inpatient Glycemic Control (2015)  Target Ranges:  Prepandial:   less than 140 mg/dL      Peak postprandial:   less than 180 mg/dL (1-2 hours)      Critically ill patients:  140 - 180 mg/dL   Lab Results  Component Value Date   GLUCAP 143 (H) 02/06/2021   HGBA1C 6.2 (H) 02/05/2021    Review of Glycemic Control  Latest Reference Range & Units 02/06/21 04:43 02/06/21 05:34 02/06/21 09:36  Glucose-Capillary 70 - 99 mg/dL 66 (L) 154 (H) 143 (H)   Diabetes history: Type 2 DM Outpatient Diabetes medications:  Current orders for Inpatient glycemic control: Novolog 0-9 units TID & HS   Inpatient Diabetes Program Recommendations:    Noted patient experienced hypoglycemia 61 mg/dL following one time dose of Semglee 10 units. Subsequent order changes have been made. Diet has been changed. Will follow.   Thanks, Bronson Curb, MSN, RNC-OB Diabetes Coordinator [phone] (8a-5p)

## 2021-02-06 NOTE — Anesthesia Postprocedure Evaluation (Signed)
Anesthesia Post Note  Patient: Tarnov  Procedure(s) Performed: ESOPHAGOGASTRODUODENOSCOPY (EGD) WITH PROPOFOL     Patient location during evaluation: PACU Anesthesia Type: MAC Level of consciousness: awake and alert, awake and oriented Pain management: pain level controlled Vital Signs Assessment: post-procedure vital signs reviewed and stable Respiratory status: spontaneous breathing, nonlabored ventilation and respiratory function stable Cardiovascular status: blood pressure returned to baseline and stable Postop Assessment: no apparent nausea or vomiting Anesthetic complications: no   No notable events documented.  Last Vitals:  Vitals:   02/06/21 0956 02/06/21 1144  BP: (!) 146/55 (!) 133/54  Pulse: 75 70  Resp: 17 12  Temp: 36.7 C 36.7 C  SpO2: 99% 96%    Last Pain:  Vitals:   02/06/21 1144  TempSrc: Oral  PainSc: 0-No pain                 Chrystine Frogge,E. Kojo Liby

## 2021-02-06 NOTE — Plan of Care (Signed)
°  Problem: Health Behavior/Discharge Planning: Goal: Ability to manage health-related needs will improve Outcome: Progressing   Problem: Clinical Measurements: Goal: Ability to maintain clinical measurements within normal limits will improve Outcome: Progressing   Problem: Activity: Goal: Risk for activity intolerance will decrease Outcome: Progressing   Problem: Nutrition: Goal: Adequate nutrition will be maintained Outcome: Progressing   Problem: Elimination: Goal: Will not experience complications related to bowel motility Outcome: Progressing   Problem: Pain Managment: Goal: General experience of comfort will improve Outcome: Progressing

## 2021-02-06 NOTE — Progress Notes (Addendum)
HD#2 SUBJECTIVE:  Patient Summary: [name] is a 80 y.o. with a pertinent PMH of hereditary hemorrhagic telangiectasias, recent hospitalization with severe GI bleed from a gastric ulcer status post clipping, history of epilepsy, insulin-dependent type 2 diabetes mellitus, CKD stage III, hypertension, hyperlipidemia, dementia, admitted for altered mental status.   Overnight Events: Patient experienced 2 episodes of hypoglycemia with CBG in the 60s.  Both times she was treated with 25 mL of D50 with appropriate response.  He was asymptomatic during these episodes.  Interim History: Patient is resting calmly in bed after EGD this morning.  She reports feeling a Estelle sleepy, otherwise has no complaints.  She does however state multiple times that she wishes to leave the hospital and return home with her husband.  OBJECTIVE:  Vital Signs: Vitals:   02/05/21 1911 02/05/21 2152 02/05/21 2321 02/06/21 0303  BP: (!) 135/55 (!) 135/56 (!) 120/47 (!) 147/66  Pulse: 78 80 78 83  Resp: 18 17 18 17   Temp: 98.5 F (36.9 C) 98.8 F (37.1 C) 98.1 F (36.7 C) 98 F (36.7 C)  TempSrc: Axillary Axillary Oral Oral  SpO2: 98%  99% 98%  Weight:      Height:       Supplemental O2: Room Air SpO2: 98 % O2 Flow Rate (L/min): 2 L/min  Filed Weights   02/03/21 0935 02/04/21 2034  Weight: 59 kg 64 kg     Intake/Output Summary (Last 24 hours) at 02/06/2021 0805 Last data filed at 02/06/2021 0540 Gross per 24 hour  Intake 2064.7 ml  Output 702 ml  Net 1362.7 ml   Net IO Since Admission: 1,821.43 mL [02/06/21 0805]  Physical Exam: Constitutional: Elderly female resting comfortably in bed, no acute distress noted. Cardio: Regular rate and rhythm.  Systolic murmur noted. Pulm: Clear to auscultation bilaterally. Abdomen: Soft, nontender, nondistended. MSK: Negative for extremity edema. Skin: Skin is warm and dry. Neuro: Patient is sleepy but easily arousable. Psych: Tearful mood,  reiterates that she wants to see her husband and return home with him.  Patient Lines/Drains/Airways Status     Active Line/Drains/Airways     Name Placement date Placement time Site Days   Implanted Port 04/13/18 Right Chest 04/13/18  0855  Chest  1030   Implanted Port Right Chest --  --  Chest  --   Peripheral IV 02/03/21 22 G Right Hand 02/03/21  1026  Hand  3   Peripheral IV 02/05/21 22 G Anterior;Right Wrist 02/05/21  0835  Wrist  1   Peripheral IV 02/05/21 22 G 1" Left;Posterior;Lateral;Upper Forearm 02/05/21  1418  Forearm  1   NG/OG Vented/Dual Lumen Left nare 02/05/21  0845  Left nare  1   External Urinary Catheter 01/18/21  0722  --  19   External Urinary Catheter 01/28/21  1940  --  9   External Urinary Catheter 02/03/21  1309  --  3   Incision (Closed) 02/05/21 Groin Right 02/05/21  1013  -- 1             ASSESSMENT/PLAN:  Assessment: Principal Problem:   AMS (altered mental status) Active Problems:   GI bleed   Altered mental status   Plan:  #Acute Upper GI bleed complicated by hemorrhagic shock - resolved #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  Patient was evaluated at bedside 12/28 due to reports of passing  blood per rectum, however this appeared to be residual bleeding from her acute active bleed earlier in the day.  She underwent EGD on 12/29 which found many nonbleeding superficial gastric ulcers with a clean ulcer base in the cardia, gastric fundus, and on the greater curvature of the stomach, noted to be consistent with trauma from NG tube.  She is status post 2 units PRBC and is receiving LR 175 cc/hour.  Hemoglobin is stable at this time at 10.1.  She is currently receiving Protonix, Zofran. -Trend CBC -Continue IVF.  Can diurese if patient becomes volume overloaded. -Of note,  given evidence of iron deposition in liver, spleen, and marrow spaces, we will need to be judicious with blood transfusions.  -Transfuse if Hgb <7 and hemodynamically unstable   #Hyperammonemia Ammonia level on admission was elevated to 188, normal LFTs.  Imaging to this point has noted signs of iron deposition in the liver, spleen, and marrow spaces.  Follow-up CT of the abdomen showed hepatic steatosis.  It is possible that her acute GI bleed contributed to elevated ammonia levels. -Continue to monitor.  If recurrence of altered mental status, consider checking additional ammonia level and giving lactulose.   #Altered mental status, resolved #Reported history of dementia Patient initially presented with altered mental status and was described as not eating or responding to questions at her SNF facility.  Work-up to this point has included serum chemistries which were unremarkable for metabolic causes of AMS, CT head which was negative and no focal neurological deficits concerning for acute CVA.  Leukocytosis has now resolved. -Continue to monitor.   #AKI on CKD stage IIIa Pre renal vs. ATN due to hypotension with hemorrhagic shock. On presentation patient was noted to have AKI on CKD stage IIIa which had resolved resolved with fluid resuscitation, however in the setting of acute GI bleed she has evidence of recurring AKI.  Patient also received contrast for IR embolization procedure yesterday.  She has previously received LR 175 cc/hour for 12 hours with no change in Cr. -At this point, could be multifactorial including secondary to hypovolemia from acute GI bleed versus contrast for procedure yesterday versus ATN -Continue LR 175 cc/hour.  Can diurese becomes volume overloaded. -Trend renal function   #History of epilepsy Continue home Keppra.   #Type 2 diabetes mellitus with insulin dependence #Diabetic neuropathy Patient had multiple readings of severe hyperglycemia 12/28 resulting in  changes to her insulin regimen.  However overnight she did have hypoglycemia into the 60s.  She was asymptomatic during this time and had appropriate response to D50 25 mg. -NovoLog sliding scale insulin every 4 hours -QHS correction -CBG monitoring before meals and nightly and if concerns of hypoglycemia   #HFpEF Most recent echo in August 2022 showed EF of 65 to 73%, grade 2 diastolic dysfunction and severe left atrial enlargement.  She is also been noted to have a pericardial effusion which is concerning for cardiac involvement no hemochromatosis as well.  Patient has home torsemide 20 mg daily as needed.  She appears euvolemic on exam. -Continue holding home torsemide 20 mg daily   #Hypertension Patient has been normotensive with mild hypertension noted post EGD.  At this time we are holding home amlodipine 2.5 mg daily. -Continue holding home amlodipine 2.5 mg daily   #Left upper lobe consolidation MR of liver showed a 2.2 x 2.3 cm nodular area in the left upper chest suspicious for pulmonary nodule or mass.  On CT chest this was described as  a rounded left upper lobe consolidation favoring focal pneumonia over neoplasm with recommendation of follow-up imaging in 6 to 8 weeks to assess for resolution of focal pneumonia versus rule out neoplasm. - Given concern of mass vs. Pneumonia, will start IV ceftriaxone 2g daily for lobar pneumonia.  Once patient is upgraded to regular diet we will transition to p.o. cefdinir for total of 5 days of treatment.             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  CT also showed signs of iron deposition in the spleen. -Incidental finding, follow-up as outpatient  Best  Practice: Diet: Thin liquids IVF: Fluids: LR, Rate:  175 cc/hour VTE: enoxaparin (LOVENOX) injection 30 mg Start: 02/05/21 1800 Code: Full AB: IV ceftriaxone 2 g daily DISPO: Anticipated discharge pending Medical stability.  Signature: Farrel Gordon, D.O.  Internal Medicine Resident, PGY-1 [name]  Pager: [phone] 8:05 AM, 02/06/2021   Please contact the on call pager after 5 pm and on weekends at [phone].

## 2021-02-06 NOTE — Op Note (Signed)
[facility] Patient Name: [name] Procedure Date : 02/06/2021 MRN: [id number] Attending MD: Ronnette Juniper , MD Date of Birth: [birth year] CSN: [id number] Age: 80 Admit Type: Inpatient Procedure:                Upper GI endoscopy Indications:              Hematemesis, Melena, bleeding GU, s/p IR guided                            embolization Providers:                Ronnette Juniper, MD, Doristine Johns, RN, Luan Moore, Technician, Theodoro Grist, CRNA Referring MD:             [facility] Medicines:                Monitored Anesthesia Care Complications:            No immediate complications. Estimated Blood Loss:     Estimated blood loss: none. Procedure:                Pre-Anesthesia Assessment:                           - Prior to the procedure, a History and Physical                            was performed, and patient medications and                            allergies were reviewed. The patient's tolerance of                            previous anesthesia was also reviewed. The risks                            and benefits of the procedure and the sedation                            options and risks were discussed with the patient.                            All questions were answered, and informed consent                            was obtained. Prior Anticoagulants: The patient has                            taken no previous anticoagulant or antiplatelet                            agents. ASA Grade Assessment: III - A patient with  severe systemic disease. After reviewing the risks                            and benefits, the patient was deemed in                            satisfactory condition to undergo the procedure.                           After obtaining informed consent, the endoscope was                            passed under direct vision. Throughout the                             procedure, the patient's blood pressure, pulse, and                            oxygen saturations were monitored continuously. The                            [id number]-[id number] ([id number]) Olympus endoscope was introduced                            through the mouth, and advanced to the second part                            of duodenum. The upper GI endoscopy was                            accomplished without difficulty. The patient                            tolerated the procedure well. Scope In: Scope Out: Findings:      The upper third of the esophagus, middle third of the esophagus and       lower third of the esophagus were normal.      The Z-line was regular and was found 35 cm from the incisors.      Many non-bleeding superficial gastric ulcers with a clean ulcer base       (Forrest Class III) were found in the cardia, in the gastric fundus and       on the greater curvature of the stomach, consistent with trauma from NG       tube.      Two endoclips were found in the gastric body. The area appeared       ulcerated but there was no evidence of active bleeding.      The examined duodenum was normal. Impression:               - Normal upper third of esophagus, middle third of                            esophagus and lower third of esophagus.                           -  Z-line regular, 35 cm from the incisors.                           - Non-bleeding gastric ulcers with a clean ulcer                            base (Forrest Class III) related to NG tube trauma.                           - Two previously placed endoclips were found in the                            stomach.                           - Normal examined duodenum.                           - No specimens collected. Moderate Sedation:      Patient did not receive moderate sedation for this procedure, but       instead received monitored anesthesia care. Recommendation:           - Resume regular diet.                            - Use Protonix (pantoprazole) 40 mg PO BID                            indefinitely. Procedure Code(s):        --- Professional ---                           [id number], Esophagogastroduodenoscopy, flexible,                            transoral; diagnostic, including collection of                            specimen(s) by brushing or washing, when performed                            (separate procedure) Diagnosis Code(s):        --- Professional ---                           K25.9, Gastric ulcer, unspecified as acute or                            chronic, without hemorrhage or perforation                           T18.2XXA, Foreign body in stomach, initial encounter                           K92.0, Hematemesis  K92.1, Melena (includes Hematochezia) CPT copyright 2019 [employer]. All rights reserved. The codes documented in this report are preliminary and upon coder review may  be revised to meet current compliance requirements. Ronnette Juniper, MD 02/06/2021 9:21:20 AM This report has been signed electronically. Number of Addenda: 0

## 2021-02-07 LAB — CBC
HCT: 27.2 % — ABNORMAL LOW (ref 36.0–46.0)
Hemoglobin: 8.7 g/dL — ABNORMAL LOW (ref 12.0–15.0)
MCH: 30.5 pg (ref 26.0–34.0)
MCHC: 32 g/dL (ref 30.0–36.0)
MCV: 95.4 fL (ref 80.0–100.0)
Platelets: 115 10*3/uL — ABNORMAL LOW (ref 150–400)
RBC: 2.85 MIL/uL — ABNORMAL LOW (ref 3.87–5.11)
RDW: 15.9 % — ABNORMAL HIGH (ref 11.5–15.5)
WBC: 8.1 10*3/uL (ref 4.0–10.5)
nRBC: 0 % (ref 0.0–0.2)

## 2021-02-07 LAB — COMPREHENSIVE METABOLIC PANEL
ALT: 16 U/L (ref 0–44)
AST: 33 U/L (ref 15–41)
Albumin: 2.1 g/dL — ABNORMAL LOW (ref 3.5–5.0)
Alkaline Phosphatase: 64 U/L (ref 38–126)
Anion gap: 5 (ref 5–15)
BUN: 22 mg/dL (ref 8–23)
CO2: 22 mmol/L (ref 22–32)
Calcium: 8.2 mg/dL — ABNORMAL LOW (ref 8.9–10.3)
Chloride: 117 mmol/L — ABNORMAL HIGH (ref 98–111)
Creatinine, Ser: 1.78 mg/dL — ABNORMAL HIGH (ref 0.44–1.00)
GFR, Estimated: 29 mL/min — ABNORMAL LOW (ref >60)
Glucose, Bld: 312 mg/dL — ABNORMAL HIGH (ref 70–99)
Potassium: 4.7 mmol/L (ref 3.5–5.1)
Sodium: 144 mmol/L (ref 135–145)
Total Bilirubin: 0.8 mg/dL (ref 0.3–1.2)
Total Protein: 4.1 g/dL — ABNORMAL LOW (ref 6.5–8.1)

## 2021-02-07 LAB — GLUCOSE, CAPILLARY
Glucose-Capillary: 236 mg/dL — ABNORMAL HIGH (ref 70–99)
Glucose-Capillary: 196 mg/dL — ABNORMAL HIGH (ref 70–99)
Glucose-Capillary: 232 mg/dL — ABNORMAL HIGH (ref 70–99)
Glucose-Capillary: 250 mg/dL — ABNORMAL HIGH (ref 70–99)
Glucose-Capillary: 193 mg/dL — ABNORMAL HIGH (ref 70–99)

## 2021-02-07 LAB — HEMOGLOBIN AND HEMATOCRIT, BLOOD
HCT: 28.6 % — ABNORMAL LOW (ref 36.0–46.0)
Hemoglobin: 9.3 g/dL — ABNORMAL LOW (ref 12.0–15.0)

## 2021-02-07 MED ORDER — INSULIN DETEMIR 100 UNIT/ML ~~LOC~~ SOLN
8.0000 [IU] | Freq: Every day | SUBCUTANEOUS | Status: DC
  Administered 2021-02-07 – 2021-02-12 (×6): 8 [IU] via SUBCUTANEOUS
  Filled 2021-02-07 (×6): qty 0.08

## 2021-02-07 MED ORDER — CEFDINIR 300 MG PO CAPS
300.0000 mg | ORAL_CAPSULE | Freq: Every day | ORAL | Status: AC
  Administered 2021-02-07 – 2021-02-10 (×4): 300 mg via ORAL
  Filled 2021-02-07 (×4): qty 1

## 2021-02-07 NOTE — Evaluation (Signed)
Physical Therapy Evaluation  Patient Details Name: [name] MRN: [id number] DOB: Jan 21, 1941 Today's Date: 02/07/2021  History of Present Illness  Pt adm 12/26 from SNF with AMS in setting of AKI or hyperammonemia . On 12/28 pt vomiting large blood clots. CT showed recurrent active gastric hemorrhage from known ulcer. Pt underwent mesenteric angiogram and left gastric embolization on 12/28.  PMH of hereditary hemorrhagic telangiectasias, GI bleeding, seizures, DM , CKD, HTN, and dementia.  Clinical Impression  Pt presents to PT with decreased mobility due to decreased strength, decreased balance, and decreased functional activity tolerance. Pt has limited physical support at home so will need return to SNF at [place].         Recommendations for follow up therapy are one component of a multi-disciplinary discharge planning process, led by the attending physician.  Recommendations may be updated based on patient status, additional functional criteria and insurance authorization.  Follow Up Recommendations Skilled nursing-short term rehab (<3 hours/day)    Assistance Recommended at Discharge Frequent or constant Supervision/Assistance  Functional Status Assessment Patient has had a recent decline in their functional status and/or demonstrates limited ability to make significant improvements in function in a reasonable and predictable amount of time  Equipment Recommendations  None recommended by PT    Recommendations for Other Services       Precautions / Restrictions Precautions Precautions: Fall      Mobility  Bed Mobility Overal bed mobility: Needs Assistance Bed Mobility: Supine to Sit     Supine to sit: Max assist;+2 for safety/equipment     General bed mobility comments: Assist to bring legs off of bed, elevate trunk into sitting and bring hips to EOB.    Transfers Overall transfer level: Needs assistance Equipment used: Rolling walker (2 wheels) Transfers: Sit to/from  Stand;Bed to chair/wheelchair/BSC Sit to Stand: Mod assist;+2 safety/equipment   Step pivot transfers: Min assist;+2 safety/equipment       General transfer comment: Assist to bring hips up to stand. Small shuffling pivotal steps bed to chair with assist for balance and support and use of walker.    Ambulation/Gait               General Gait Details: Did not attempt  Stairs            Wheelchair Mobility    Modified Rankin (Stroke Patients Only)       Balance Overall balance assessment: Needs assistance Sitting-balance support: No upper extremity supported;Feet supported Sitting balance-Leahy Scale: Fair     Standing balance support: Bilateral upper extremity supported;Reliant on assistive device for balance Standing balance-Leahy Scale: Poor Standing balance comment: walker and min assist for static standing                             Pertinent Vitals/Pain Pain Assessment: No/denies pain    Home Living Family/patient expects to be discharged to:: Skilled nursing facility Living Arrangements: Spouse/significant other Available Help at Discharge: Family Type of Home: House Home Access: Ramped entrance       Home Layout: One level Home Equipment: Air cabin crew (4 wheels);BSC/3in1;Wheelchair - manual Additional Comments: pt has been at SNF since prior hospitalizations    Prior Function Prior Level of Function : Needs assist       Physical Assist : Mobility (physical);ADLs (physical) Mobility (physical): Gait;Transfers   Mobility Comments: Assist for transfers and gait with rolling walker at SNF  Hand Dominance   Dominant Hand: Right    Extremity/Trunk Assessment   Upper Extremity Assessment Upper Extremity Assessment: Defer to OT evaluation    Lower Extremity Assessment Lower Extremity Assessment: Generalized weakness    Cervical / Trunk Assessment Cervical / Trunk Assessment: Kyphotic  Communication    Communication: No difficulties  Cognition Arousal/Alertness: Awake/alert Behavior During Therapy: WFL for tasks assessed/performed Overall Cognitive Status: History of cognitive impairments - at baseline Area of Impairment: Memory;Safety/judgement;Problem solving                     Memory: Decreased short-term memory   Safety/Judgement: Decreased awareness of safety   Problem Solving: Slow processing;Requires verbal cues          General Comments      Exercises     Assessment/Plan    PT Assessment Patient needs continued PT services  PT Problem List Decreased strength;Decreased balance;Decreased mobility;Decreased activity tolerance;Decreased safety awareness       PT Treatment Interventions DME instruction;Functional mobility training;Balance training;Patient/family education;Gait training;Therapeutic activities;Therapeutic exercise    PT Goals (Current goals can be found in the Care Plan section)  Acute Rehab PT Goals Patient Stated Goal: go home PT Goal Formulation: With patient Time For Goal Achievement: 02/20/21 Potential to Achieve Goals: Fair    Frequency Min 2X/week   Barriers to discharge        Co-evaluation               AM-PAC PT "6 Clicks" Mobility  Outcome Measure Help needed turning from your back to your side while in a flat bed without using bedrails?: A Lot Help needed moving from lying on your back to sitting on the side of a flat bed without using bedrails?: A Lot Help needed moving to and from a bed to a chair (including a wheelchair)?: Total Help needed standing up from a chair using your arms (e.g., wheelchair or bedside chair)?: A Lot Help needed to walk in hospital room?: Total Help needed climbing 3-5 steps with a railing? : Total 6 Click Score: 9    End of Session Equipment Utilized During Treatment: Gait belt Activity Tolerance: Patient limited by fatigue Patient left: in chair;with call bell/phone within reach;with  chair alarm set;with family/visitor present Nurse Communication: Mobility status PT Visit Diagnosis: Other abnormalities of gait and mobility (R26.89);Muscle weakness (generalized) (M62.81)    Time:  -      Charges:              [place] Pager [phone] Office [place] 02/07/2021, 9:05 AM

## 2021-02-07 NOTE — Progress Notes (Signed)
HD#3 SUBJECTIVE:  Patient Summary: [name] is a 80 y.o. with a pertinent PMH of hereditary hemorrhagic telangiectasias, recent hospitalization with severe GI bleed from a gastric ulcer status post clipping, history of epilepsy, insulin-dependent type 2 diabetes mellitus, CKD stage III, hypertension, hyperlipidemia, dementia, admitted for altered mental status.   Overnight Events: Patient had recorded hypoglycemic episode into the 300s.  She was treated with 2 units of NovoLog which ultimately brought her blood glucose down to the upper 100s.  There has been confusion over how much of her meals she is actually eating.  Interim History: [name] is resting comfortably in bed this morning.  She is concerned about her blood sugars being so high despite not eating or drinking very much at all.  She has no complaints otherwise.  OBJECTIVE:  Vital Signs: Vitals:   02/06/21 1948 02/06/21 2300 02/07/21 0343 02/07/21 0852  BP: (!) 122/46 131/63 (!) 119/52 (!) 103/41  Pulse: 77 71 66 62  Resp: 16 16 17 16   Temp: 98 F (36.7 C) (!) 97.3 F (36.3 C) 98.8 F (37.1 C) 98.2 F (36.8 C)  TempSrc: Oral Oral Oral Oral  SpO2: 100% 100% 98% 98%  Weight:      Height:       Supplemental O2: Room Air SpO2: 98 % O2 Flow Rate (L/min): 2 L/min  Filed Weights   02/03/21 0935 02/04/21 2034 02/06/21 0820  Weight: 59 kg 64 kg 64 kg     Intake/Output Summary (Last 24 hours) at 02/07/2021 2878 Last data filed at 02/07/2021 6767 Gross per 24 hour  Intake 30 ml  Output 700 ml  Net -670 ml   Net IO Since Admission: 1,151.43 mL [02/07/21 0952]  Physical Exam: Constitutional: Elderly female resting company in bed, no acute distress noted. Cardio: Regular rate and rhythm.  Systolic murmur noted. Pulm: Clear to auscultation bilaterally.  Normal work of breathing on room air. Abdomen: Soft, nontender, nondistended. MSK: Negative for extremity edema. Skin: Skin is warm and dry. Neuro: Alert and  oriented x3.  No focal deficit noted. Psych: Normal mood and affect.  Patient Lines/Drains/Airways Status     Active Line/Drains/Airways     Name Placement date Placement time Site Days   Implanted Port 04/13/18 Right Chest 04/13/18  0855  Chest  1031   Implanted Port Right Chest --  --  Chest  --   Peripheral IV 02/03/21 22 G Right Hand 02/03/21  1026  Hand  4   Peripheral IV 02/05/21 22 G Anterior;Right Wrist 02/05/21  0835  Wrist  2   Peripheral IV 02/05/21 22 G 1" Left;Posterior;Lateral;Upper Forearm 02/05/21  1418  Forearm  2   External Urinary Catheter 01/18/21  0722  --  20   External Urinary Catheter 01/28/21  1940  --  10   External Urinary Catheter 02/03/21  1309  --  4   Incision (Closed) 02/05/21 Groin Right 02/05/21  1013  -- 2             ASSESSMENT/PLAN:  Assessment: Principal Problem:   AMS (altered mental status) Active Problems:   GI bleed   Altered mental status  [name] is a 80 y.o. with a pertinent PMH of hereditary hemorrhagic telangiectasias, recent hospitalization with severe GI bleed from a gastric ulcer status post clipping, history of epilepsy, insulin-dependent type 2 diabetes mellitus, CKD stage III, hypertension, hyperlipidemia, dementia, admitted for altered mental status.   Plan: #Acute Upper GI bleed complicated by  hemorrhagic shock - resolved #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  She underwent EGD on 12/29 which found many nonbleeding superficial gastric ulcers. Hemoglobin is beginning to downtrend again, now 8.7. She is currently receiving Protonix, Zofran.  Of note, patient was also started on Lovenox 12/29. -Lovenox discontinued today.  We will need to monitor closely for any signs of recurrent bleeding.   -Trend CBC -Concern for hemodilution given high rate  and duration of IVF.  Decrease rate to 100 cc/hr.  -Of note, given evidence of iron deposition in liver, spleen, and marrow spaces, we will need to be judicious with blood transfusions.  -Transfuse if Hgb <7 and hemodynamically unstable   #Hyperammonemia Ammonia level on admission was elevated to 188, normal LFTs.  Imaging to this point has noted signs of iron deposition in the liver, spleen, and marrow spaces.  Follow-up CT of the abdomen showed hepatic steatosis.  It is possible that her acute GI bleed contributed to elevated ammonia levels. -Continue to monitor.  If recurrence of altered mental status, consider checking additional ammonia level and giving lactulose.   #Altered mental status, resolved #Reported history of dementia Patient continues to be alert and oriented x3 with improvement of AKI and resolution of acute on chronic GI bleed. -Continue to monitor.   #AKI on CKD stage IIIa Patient was noted to have AKI on admission, which initially improved with volume resuscitation however with acute GI bleed 12/28 she had recurrence of AKI.  To this point she is received LR 175 cc/h.   -Decrease LR to 100 cc/hour.  Can diurese becomes volume overloaded. -Trend renal function   #History of epilepsy Continue home Keppra.   #Type 2 diabetes mellitus with insulin dependence #Diabetic neuropathy Blood glucose measurements have been very labile over the last several days.  Overnight she had hyperglycemia into the 300s requiring 2 units of NovoLog which did bring her sugars down into the upper 100s.  There is been some confusion over how much of her meals she is actually eating, and it seems that she is eating less than 50% most of the time. -Diabetes coordinator following, appreciate her assistance.  -We will discontinue order for breakfast 70/30  -Start Levemir 8 units daily -NovoLog sliding scale insulin every 8 hours -QHS correction -CBG monitoring before meals and nightly and if  concerns of hypoglycemia   #HFpEF Most recent echo in August 2022 showed EF of 65 to 64%, grade 2 diastolic dysfunction and severe left atrial enlargement.  She is also been noted to have a pericardial effusion which is concerning for cardiac involvement no hemochromatosis as well.  Patient has home torsemide 20 mg daily as needed.  She appears euvolemic on exam. -Continue holding home torsemide 20 mg daily   #Hypertension Normotensive at this time.  We are holding home amlodipine 2.5 mg daily. -Continue holding home amlodipine 2.5 mg daily   #Left upper lobe consolidation CT chest was remarkable for rounded left upper lobe consolidation favoring focal pneumonia over neoplasm with recommendation of follow-up imaging in 6 to 8 weeks to assess for resolution of focal pneumonia versus rule out neoplasm.  Patient was previously on IV ceftriaxone 2 g daily for concern for lobar pneumonia. -Transition to cefdinir 3 to milligrams p.o. 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  CT also showed signs of iron deposition in the spleen. -Incidental finding, follow-up as outpatient  Best Practice: Diet: Regular diet IVF: Fluids: LR, Rate:  100cc/hr VTE: Place and maintain sequential compression device Start: 02/07/21 0653 Code: Full AB: Cefdinir 300 mg p.o. daily Therapy Recs: SNF, DME: none DISPO: Anticipated discharge to Skilled nursing facility pending Medical stability.  Signature: Farrel Gordon, D.O.  Internal Medicine Resident, PGY-1 [name]  Pager: [phone] 9:52 AM, 02/07/2021   Please contact the on call pager after 5 pm and on weekends at [phone].

## 2021-02-07 NOTE — Plan of Care (Signed)
?  Problem: Clinical Measurements: ?Goal: Will remain free from infection ?Outcome: Progressing ?  ?Problem: Activity: ?Goal: Risk for activity intolerance will decrease ?Outcome: Progressing ?  ?Problem: Elimination: ?Goal: Will not experience complications related to bowel motility ?Outcome: Progressing ?  ?Problem: Pain Managment: ?Goal: General experience of comfort will improve ?Outcome: Progressing ?  ?

## 2021-02-07 NOTE — Evaluation (Signed)
Occupational Therapy Evaluation Patient Details Name: [name] MRN: [id number] DOB: Dec 25, 1940 Today's Date: 02/07/2021   History of Present Illness Pt adm 12/26 from SNF with AMS in setting of AKI or hyperammonemia . On 12/28 pt vomiting large blood clots. CT showed recurrent active gastric hemorrhage from known ulcer. Pt underwent mesenteric angiogram and left gastric embolization on 12/28.  PMH of hereditary hemorrhagic telangiectasias, GI bleeding, seizures, DM , CKD, HTN, and dementia.   Clinical Impression   Pt PTA: Pt from SNF from prior hospitalizations with baseline dementia. Pt currently, set-upA to maxA for ADL tasks and modA for transfers with RW. Pt taking a few steps, but not able to ambulate more than 37foot. Pt would benefit from continued OT skilled services. OT following acutely.     Recommendations for follow up therapy are one component of a multi-disciplinary discharge planning process, led by the attending physician.  Recommendations may be updated based on patient status, additional functional criteria and insurance authorization.   Follow Up Recommendations  Skilled nursing-short term rehab (<3 hours/day)    Assistance Recommended at Discharge Frequent or constant Supervision/Assistance  Functional Status Assessment  Patient has had a recent decline in their functional status and demonstrates the ability to make significant improvements in function in a reasonable and predictable amount of time.  Equipment Recommendations  None recommended by OT    Recommendations for Other Services       Precautions / Restrictions Precautions Precautions: Fall Restrictions Weight Bearing Restrictions: No      Mobility Bed Mobility Overal bed mobility: Needs Assistance Bed Mobility: Supine to Sit     Supine to sit: Mod assist     General bed mobility comments: assist with moving hips to EOB    Transfers Overall transfer level: Needs assistance Equipment  used: Rolling walker (2 wheels) Transfers: Sit to/from Stand;Bed to chair/wheelchair/BSC Sit to Stand: Mod assist     Step pivot transfers: Mod assist     General transfer comment: Assist to full stand up with hips off of bed; taking steps to recliner and sudden sitting down before getting close enough to recliner.      Balance Overall balance assessment: Needs assistance Sitting-balance support: No upper extremity supported;Feet supported Sitting balance-Leahy Scale: Fair     Standing balance support: Bilateral upper extremity supported;Reliant on assistive device for balance Standing balance-Leahy Scale: Poor Standing balance comment: walker and min assist for static standing                           ADL either performed or assessed with clinical judgement   ADL Overall ADL's : Needs assistance/impaired Eating/Feeding: Set up;Sitting;Cueing for sequencing   Grooming: Set up;Sitting;Cueing for sequencing   Upper Body Bathing: Minimal assistance;Sitting;Cueing for sequencing   Lower Body Bathing: Maximal assistance;Sitting/lateral leans;Sit to/from stand;Cueing for sequencing;Cueing for safety   Upper Body Dressing : Min guard;Sitting;Cueing for sequencing   Lower Body Dressing: Moderate assistance;Cueing for sequencing;Sitting/lateral leans;Sit to/from stand   Toilet Transfer: Minimal assistance;Cueing for safety;Cueing for sequencing;Ambulation   Toileting- Clothing Manipulation and Hygiene: Moderate assistance;Cueing for sequencing;Cueing for safety;Sitting/lateral lean;Sit to/from stand       Functional mobility during ADLs: Minimal assistance;Rolling walker (2 wheels);Cueing for safety;Cueing for sequencing General ADL Comments: Pt requiring simple commands and gestures to complete each task. Pt set-upA for eating and washing hands with a clean rag as pt's food had arrived.     Vision Baseline Vision/History: 1 Wears  glasses Ability to See in Adequate  Light: 1 Impaired Patient Visual Report: No change from baseline Vision Assessment?: No apparent visual deficits     Perception     Praxis      Pertinent Vitals/Pain Pain Assessment: No/denies pain     Hand Dominance Right   Extremity/Trunk Assessment Upper Extremity Assessment Upper Extremity Assessment: Generalized weakness   Lower Extremity Assessment Lower Extremity Assessment: Generalized weakness   Cervical / Trunk Assessment Cervical / Trunk Assessment: Kyphotic   Communication Communication Communication: No difficulties   Cognition Arousal/Alertness: Awake/alert Behavior During Therapy: WFL for tasks assessed/performed Overall Cognitive Status: History of cognitive impairments - at baseline Area of Impairment: Memory;Safety/judgement;Problem solving                     Memory: Decreased short-term memory   Safety/Judgement: Decreased awareness of safety   Problem Solving: Slow processing;Requires verbal cues General Comments: Pt pleasant and dx of dementia. Pt following simple commands.     General Comments  Niece came in room to conclude session    Exercises     Shoulder Instructions      Home Living Family/patient expects to be discharged to:: Skilled nursing facility Living Arrangements: Spouse/significant other Available Help at Discharge: Family Type of Home: House Home Access: Ramped entrance     [place]: One level     Bathroom Shower/Tub: Occupational psychologist: [place]: Air cabin crew (4 wheels);BSC/3in1;Wheelchair - manual   Additional Comments: pt has been at SNF since prior hospitalizations      Prior Functioning/Environment Prior Level of Function : Needs assist       Physical Assist : Mobility (physical);ADLs (physical) Mobility (physical): Gait;Transfers   Mobility Comments: Assist for transfers and gait with rolling walker at SNF ADLs Comments: Assist with UB/LB         OT Problem List: Decreased strength;Decreased activity tolerance;Impaired balance (sitting and/or standing);Decreased coordination;Decreased safety awareness      OT Treatment/Interventions: Self-care/ADL training;Therapeutic exercise;Energy conservation;DME and/or AE instruction;Therapeutic activities;Patient/family education;Balance training    OT Goals(Current goals can be found in the care plan section) Acute Rehab OT Goals Patient Stated Goal: niece wants pt to be stronger OT Goal Formulation: With patient Time For Goal Achievement: 02/12/21 Potential to Achieve Goals: Good ADL Goals Pt Will Perform Grooming: with supervision;standing Pt Will Transfer to Toilet: with supervision;ambulating Additional ADL Goal #1: Pt will follow simple commands with 100% accuracy for ADL tasks.  OT Frequency: Min 2X/week   Barriers to D/C:            Co-evaluation              AM-PAC OT "6 Clicks" Daily Activity     Outcome Measure Help from another person eating meals?: A [place] Help from another person taking care of personal grooming?: A [place] Help from another person toileting, which includes using toliet, bedpan, or urinal?: A Lot Help from another person bathing (including washing, rinsing, drying)?: A Lot Help from another person to put on and taking off regular upper body clothing?: A [place] Help from another person to put on and taking off regular lower body clothing?: A Lot 6 Click Score: 15   End of Session Equipment Utilized During Treatment: Gait belt;Rolling walker (2 wheels) Nurse Communication: Mobility status  Activity Tolerance: Patient tolerated treatment well Patient left: in bed;with call bell/phone within reach;with chair alarm set;with family/visitor present  OT Visit Diagnosis: Unsteadiness  on feet (R26.81);Other abnormalities of gait and mobility (R26.89);Muscle weakness (generalized) (M62.81)                Time: 9323-5573 OT Time Calculation (min):  30 min Charges:  OT General Charges $OT Visit: 1 Visit OT Evaluation $OT Eval Moderate Complexity: 1 Mod OT Treatments $Self Care/Home Management : 8-22 mins  Jefferey Pica, OTR/L Acute Rehabilitation Services Pager: [phone] Office: [phone] Demonta Wombles C 02/07/2021, 2:34 PM

## 2021-02-07 NOTE — Progress Notes (Signed)
Inpatient Diabetes Program Recommendations  AACE/ADA: New Consensus Statement on Inpatient Glycemic Control (2015)  Target Ranges:  Prepandial:   less than 140 mg/dL      Peak postprandial:   less than 180 mg/dL (1-2 hours)      Critically ill patients:  140 - 180 mg/dL   Lab Results  Component Value Date   GLUCAP 196 (H) 02/07/2021   HGBA1C 6.2 (H) 02/05/2021    Review of Glycemic Control  Latest Reference Range & Units 02/06/21 20:57 02/06/21 23:17 02/07/21 04:56 02/07/21 07:13  Glucose-Capillary 70 - 99 mg/dL 389 (H) 299 (H) 236 (H) 196 (H)  (H): Data is abnormally high   Diabetes history: Type 2 DM Outpatient Diabetes medications: Humalog 75/25 12 units QAM, Humalog 3 units TID, Trulicity 1.5 mg qwk Current orders for Inpatient glycemic control: Novolog 0-9 units TID & HS, Novolog 70/30 12 units QAM   Inpatient Diabetes Program Recommendations:    Patient is not yet eating >50% of meals; verified with RN. Would consider discontinuing 70/30 and instead consider Levemir 8 units QD.   Thanks, Bronson Curb, MSN, RNC-OB Diabetes Coordinator [phone] (8a-5p)

## 2021-02-07 NOTE — Progress Notes (Signed)
Palliative: ° °HPI: 80 y.o. female  with past medical history of dementia, epilepsy, diastolic heart failure, hypertension, hyperlipidemia, iron deficiency anemia, pulmonary hypertension, hereditary hemorrhagic telangiectasias, gastric AVMs, GIB, CKD stage 3a, diabetes admitted on 02/03/2021 with altered mental status and AKI with recent admission with similar presentation and some improvement with IV fluids. Concern for chronically elevated ammonia levels and concern for potential liver disease - work up is ongoing. MRI with evidence of hemochromatosis  ° °I met today at [name]'s bedside. No family/visitors present. She is more alert and mostly oriented today (she cannot recall date or year). She understands her complicated condition with recurrent bleeding and asks "can you just stop the bleeding?" I explain that we do treat the bleeding to stop it when bleeding occurs but there is no way for us to prevent further bleeding episodes. She expresses understanding and appreciation for her care. She tells me "if I die tonight it's okay because I know I'm going to heaven." I attempted to ask her about her wishes for resuscitation given her good insight at the time but she refers me to her husband.  ° °I attempted to reach Mr. Vondra but with no success.  ° °All questions/concerns addressed. Emotional support provided.  ° °Exam: Alert, oriented to person, place, somewhat to situation. No distress. HR RRR. Breathing regular, unlabored. Abd soft.  ° °Plan: °- Ongoing goals of care discussions.  ° °20 min ° ° , NP °Palliative Medicine Team °Pager [phone] (Please see [email or web address] for schedule) °Team Phone [phone]  ° ° °Greater than 50%  of this time was spent counseling and coordinating care related to the above assessment and plan   °

## 2021-02-08 DIAGNOSIS — E7220 Disorder of urea cycle metabolism, unspecified: Secondary | ICD-10-CM | POA: Diagnosis not present

## 2021-02-08 DIAGNOSIS — R410 Disorientation, unspecified: Secondary | ICD-10-CM | POA: Diagnosis not present

## 2021-02-08 DIAGNOSIS — K922 Gastrointestinal hemorrhage, unspecified: Secondary | ICD-10-CM | POA: Diagnosis not present

## 2021-02-08 LAB — CBC
HCT: 25.7 % — ABNORMAL LOW (ref 36.0–46.0)
Hemoglobin: 8.6 g/dL — ABNORMAL LOW (ref 12.0–15.0)
MCH: 31.7 pg (ref 26.0–34.0)
MCHC: 33.5 g/dL (ref 30.0–36.0)
MCV: 94.8 fL (ref 80.0–100.0)
Platelets: 97 10*3/uL — ABNORMAL LOW (ref 150–400)
RBC: 2.71 MIL/uL — ABNORMAL LOW (ref 3.87–5.11)
RDW: 15.5 % (ref 11.5–15.5)
WBC: 5.9 10*3/uL (ref 4.0–10.5)
nRBC: 0 % (ref 0.0–0.2)

## 2021-02-08 LAB — COMPREHENSIVE METABOLIC PANEL
ALT: 15 U/L (ref 0–44)
AST: 30 U/L (ref 15–41)
Albumin: 2.1 g/dL — ABNORMAL LOW (ref 3.5–5.0)
Alkaline Phosphatase: 64 U/L (ref 38–126)
Anion gap: 8 (ref 5–15)
BUN: 15 mg/dL (ref 8–23)
CO2: 21 mmol/L — ABNORMAL LOW (ref 22–32)
Calcium: 8.4 mg/dL — ABNORMAL LOW (ref 8.9–10.3)
Chloride: 115 mmol/L — ABNORMAL HIGH (ref 98–111)
Creatinine, Ser: 1.45 mg/dL — ABNORMAL HIGH (ref 0.44–1.00)
GFR, Estimated: 36 mL/min — ABNORMAL LOW (ref >60)
Glucose, Bld: 209 mg/dL — ABNORMAL HIGH (ref 70–99)
Potassium: 4.8 mmol/L (ref 3.5–5.1)
Sodium: 144 mmol/L (ref 135–145)
Total Bilirubin: 0.7 mg/dL (ref 0.3–1.2)
Total Protein: 3.9 g/dL — ABNORMAL LOW (ref 6.5–8.1)

## 2021-02-08 LAB — GLUCOSE, CAPILLARY
Glucose-Capillary: 164 mg/dL — ABNORMAL HIGH (ref 70–99)
Glucose-Capillary: 264 mg/dL — ABNORMAL HIGH (ref 70–99)
Glucose-Capillary: 232 mg/dL — ABNORMAL HIGH (ref 70–99)
Glucose-Capillary: 313 mg/dL — ABNORMAL HIGH (ref 70–99)
Glucose-Capillary: 253 mg/dL — ABNORMAL HIGH (ref 70–99)

## 2021-02-08 LAB — RESP PANEL BY RT-PCR (FLU A&B, COVID) ARPGX2
Influenza A by PCR: NEGATIVE
Influenza B by PCR: NEGATIVE
SARS Coronavirus 2 by RT PCR: NEGATIVE

## 2021-02-08 NOTE — Progress Notes (Signed)
HD#4 SUBJECTIVE:  Patient Summary: [name] is a 80 y.o. with a pertinent PMH of hereditary hemorrhagic telangiectasias, recent hospitalization with severe GI bleed from a gastric ulcer status post clipping, history of epilepsy, insulin-dependent type 2 diabetes mellitus, CKD stage III, hypertension, hyperlipidemia, dementia, admitted for altered mental status.   Overnight Events: None  Interim History: Patient reports feeling well today.  She is agreeable to discharging to a facility when we are able to find placement.  After discussion of the likelihood that she will again have a large bleed, and that these are very dangerous, she voiced understanding and that she is prior full that it will not happen.  OBJECTIVE:  Vital Signs: Vitals:   02/07/21 1927 02/07/21 2343 02/08/21 0327 02/08/21 0740  BP: (!) 129/53 (!) 136/52 (!) 130/51 (!) 127/50  Pulse: 66 70 (!) 58 60  Resp: 16 12 14 16   Temp: 98.1 F (36.7 C) (!) 97.4 F (36.3 C) 98.2 F (36.8 C) 98.4 F (36.9 C)  TempSrc: Oral Oral Oral Oral  SpO2: 100% 98% 98% 99%  Weight:      Height:       Supplemental O2: Room Air SpO2: 99 % O2 Flow Rate (L/min): 2 L/min  Filed Weights   02/03/21 0935 02/04/21 2034 02/06/21 0820  Weight: 59 kg 64 kg 64 kg     Intake/Output Summary (Last 24 hours) at 02/08/2021 1017 Last data filed at 02/07/2021 1900 Gross per 24 hour  Intake 120 ml  Output --  Net 120 ml   Net IO Since Admission: 1,391.43 mL [02/08/21 1017]  Physical Exam: Constitutional: Elderly female resting comfortably in bed, no acute distress noted. Cardio: Regular rate and rhythm.  Systolic murmur noted. Pulm: Clear to auscultation bilaterally.  Normal work of breathing on room air. Abdomen: Soft, nontender, nondistended. MSK: Negative for extremity edema. Skin: Skin is warm and dry. Neuro: Alert and oriented x3.  No focal deficit noted. Psych: Normal mood and affect.  Patient Lines/Drains/Airways Status      Active Line/Drains/Airways     Name Placement date Placement time Site Days   Implanted Port 04/13/18 Right Chest 04/13/18  0855  Chest  1032   Implanted Port Right Chest --  --  Chest  --   Peripheral IV 02/03/21 22 G Right Hand 02/03/21  1026  Hand  5   Peripheral IV 02/05/21 22 G Anterior;Right Wrist 02/05/21  0835  Wrist  3   Peripheral IV 02/05/21 22 G 1" Left;Posterior;Lateral;Upper Forearm 02/05/21  1418  Forearm  3   External Urinary Catheter 01/18/21  0722  --  21   External Urinary Catheter 01/28/21  1940  --  11   External Urinary Catheter 02/03/21  1309  --  5   Incision (Closed) 02/05/21 Groin Right 02/05/21  1013  -- 3             ASSESSMENT/PLAN:  Assessment: Principal Problem:   AMS (altered mental status) Active Problems:   GI bleed   Altered mental status   Plan: [name] is a 80 y.o. with a pertinent PMH of hereditary hemorrhagic telangiectasias, recent hospitalization with severe GI bleed from a gastric ulcer status post clipping, history of epilepsy, insulin-dependent type 2 diabetes mellitus, CKD stage III, hypertension, hyperlipidemia, dementia, admitted for altered mental status.    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  She underwent EGD on 12/29 which found many nonbleeding superficial gastric ulcers. Hemoglobin was noted to be trending downward, however she had been receiving IVF at a high rate.  This has progressively improved with lowering of IVF rate.  She has not had further signs of acute bleeding.  She is currently receiving Protonix, Zofran.  Of note, patient was also started on Lovenox 12/29 which was discontinued on 12/30. -Lovenox discontinued today.  We will need to monitor closely for  any signs of recurrent bleeding.   -Trend CBC -Discontinue IVF -Of note, given evidence of iron deposition in liver, spleen, and marrow spaces, we will need to be judicious with blood transfusions.  -Transfuse if Hgb <7 and hemodynamically unstable   #Hyperammonemia Ammonia level on admission was elevated to 188, normal LFTs.  Imaging to this point has noted signs of iron deposition in the liver, spleen, and marrow spaces.  Follow-up CT of the abdomen showed hepatic steatosis.  It is possible that her acute GI bleed contributed to elevated ammonia levels. -Continue to monitor.  If recurrence of altered mental status, consider checking additional ammonia level and giving lactulose.   #Altered mental status, resolved #Reported history of dementia Patient continues to be alert and oriented x3 with improvement of AKI and resolution of acute on chronic GI bleed. -Continue to monitor.   #AKI on CKD stage IIIa Patient was noted to have AKI on admission, which initially improved with volume resuscitation however with acute GI bleed 12/28 she had recurrence of AKI.  She has been fluid resuscitated and it appears that her kidney function is at her baseline at this time. -Discontinue IVF -Trend renal function   #History of epilepsy Continue home Keppra.   #Type 2 diabetes mellitus with insulin dependence #Diabetic neuropathy Blood glucose measurements have been very labile over the last several days.  Unfortunately the patient does not seem to be eating much of her meals. -Diabetes coordinator following, appreciate her assistance.             -Continue Levemir 8 units daily -NovoLog sliding scale insulin every 8 hours -QHS correction -CBG monitoring before meals and nightly and if concerns of hypoglycemia   #HFpEF Most recent echo in August 2022 showed EF of 65 to 90%, grade 2 diastolic dysfunction and severe left atrial enlargement.  She is also been noted to have a pericardial effusion which  is concerning for cardiac involvement no hemochromatosis as well.  Patient has home torsemide 20 mg daily as needed.  She appears euvolemic on exam. -Continue holding home torsemide 20 mg daily   #Hypertension Normotensive at this time.  We are holding home amlodipine 2.5 mg daily. -Continue holding home amlodipine 2.5 mg daily   #Left upper lobe consolidation CT chest was remarkable for rounded left upper lobe consolidation favoring focal pneumonia over neoplasm with recommendation of follow-up imaging in 6 to 8 weeks to assess for resolution of focal pneumonia versus rule out neoplasm.  Patient was previously on IV ceftriaxone 2 g daily for concern for lobar pneumonia. -Transition to cefdinir 3 to milligrams p.o. 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  CT also showed signs of iron deposition in the spleen. -Incidental finding, follow-up as outpatient  Best Practice: Diet: Regular diet IVF: Fluids: LR, Rate:  100cc/hr VTE: Place and maintain sequential compression device Start: 02/07/21 0653 Code: Full AB: Cefdinir 300 mg p.o. daily Therapy Recs: SNF, DME: none DISPO: Anticipated discharge to Skilled nursing facility pending placement.    Signature: Farrel Gordon, D.O.  Internal Medicine Resident, PGY-1 [name]  Pager: [phone] 10:17 AM, 02/08/2021   Please contact the on call pager after 5 pm and on weekends at [phone].

## 2021-02-08 NOTE — NC FL2 (Signed)
[place] MEDICAID FL2 LEVEL OF CARE SCREENING TOOL     IDENTIFICATION  Patient Name: [name] Birthdate: 07/19/1940 Sex: female Admission Date (Current Location): 02/03/2021  [place] Number:  Kathleen Argue [phone] [place] and Address:  [facility] [place] [address] [place], [place] [address]      Provider Number: [id number]  Attending Physician Name and Address:  Sid Falcon, MD  Relative Name and Phone Number:  Alvis Lemmings, Daughter [phone]    Current Level of Care: Hospital Recommended Level of Care: [place] Prior Approval Number:    Date Approved/Denied:   PASRR Number: [id number]  Discharge Plan: SNF    Current Diagnoses: Patient Active Problem List   Diagnosis Date Noted   AMS (altered mental status) 02/04/2021   Altered mental status 02/03/2021   AKI (acute kidney injury) ([place]) 01/28/2021   GI bleed 01/18/2021   Bullous lesion 10/08/2020   Hearing loss 05/29/2020   Neuropathy 07/27/2019   Hyperkalemia 10/18/2018   Port-A-Cath in place 05/23/2018   Iron deficiency anemia due to chronic blood loss 04/19/2018   Acute right hip pain 03/10/2018   Hyperpigmented skin lesion 02/07/2018   Leg weakness, bilateral 12/23/2017   Hematemesis 12/04/2017   Pain localized to upper abdomen 10/05/2017   Lower extremity pain, bilateral 07/15/2017   Pulmonary HTN ([place]) 04/01/2017   Heart murmur 04/01/2017   Overgrown toenails 03/18/2017   Osteopenia after menopause 01/13/2017   Epistaxis 07/30/2016   Venous insufficiency of both lower extremities 04/27/2016   Anemia due to chronic blood loss    Dry skin 02/17/2016   Peripheral vascular disease ([place]) 12/30/2015   Pain in joint of right shoulder 10/22/2015   PAF (paroxysmal atrial fibrillation) ([place]) 89/16/9450   Lichen planus 38/88/2800   Healthcare maintenance 06/20/2015   Seborrheic keratosis 06/20/2015   Memory loss 01/30/2015   Simple febrile  convulsions ([place]) 10/21/2014   Hyperammonemia ([place]) 10/03/2014   CKD (chronic kidney disease), stage III ([name]    Seizures ([place]) 09/2014   Acute kidney injury ([place]) 08/27/2014   Chronic diastolic CHF (congestive heart failure) ([place]) 10/03/2013   Acute on chronic heart failure with preserved ejection fraction (HFpEF) ([place]) 10/03/2013   Type 2 diabetes mellitus without complication ([place]) 34/91/7915   HTN (hypertension), benign 03/02/2012   Fatigue 07/10/2011   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   HHT (hereditary hemorrhagic telangiectasia) ([place]) 02/01/2011    Orientation RESPIRATION BLADDER Height & Weight     Self, Time, Situation, Place  Normal External catheter Weight: 141 lb 1.5 oz (64 kg) Height:  5\' 3"  (160 cm)  BEHAVIORAL SYMPTOMS/MOOD NEUROLOGICAL BOWEL NUTRITION STATUS      Continent Diet (See discharge Summary)  AMBULATORY STATUS COMMUNICATION OF NEEDS Skin   Independent Verbally Normal                       Personal Care Assistance Level of Assistance  Bathing, Feeding, Dressing Bathing Assistance: Limited assistance Feeding assistance: Independent Dressing Assistance: Independent     Functional Limitations Info  Sight, Hearing Sight Info: Adequate Hearing Info: Adequate Speech Info: Adequate    SPECIAL CARE FACTORS FREQUENCY  PT (By licensed PT), OT (By licensed OT)     PT Frequency: 5 x Week OT Frequency: 5 X week            Contractures Contractures Info: Not present    Additional Factors Info  Code Status Code Status Info: Full Allergies Info: aspirin  Current Medications (02/08/2021):  This is the current hospital active medication list Current Facility-Administered Medications  Medication Dose Route Frequency Provider Last Rate Last Admin   0.9 %  sodium chloride infusion (Manually program via Guardrails IV Fluids)   Intravenous Once Ronnette Juniper, MD       acetaminophen (TYLENOL) tablet 650 mg  650 mg Oral Q6H PRN Ronnette Juniper, MD        Or   acetaminophen (TYLENOL) suppository 650 mg  650 mg Rectal Q6H PRN Ronnette Juniper, MD       cefdinir (OMNICEF) capsule 300 mg  300 mg Oral Daily Farrel Gordon, DO   300 mg at 02/07/21 1152   Chlorhexidine Gluconate Cloth 2 % PADS 6 each  6 each Topical Daily Ronnette Juniper, MD   6 each at 02/08/21 0930   gabapentin (NEURONTIN) capsule 100 mg  100 mg Oral BID Ronnette Juniper, MD   100 mg at 02/08/21 0929   insulin aspart (novoLOG) injection 0-9 Units  0-9 Units Subcutaneous TID WC Harvie Heck, MD   2 Units at 02/08/21 0729   insulin detemir (LEVEMIR) injection 8 Units  8 Units Subcutaneous Daily Farrel Gordon, DO   8 Units at 02/08/21 0930   levETIRAcetam (KEPPRA) tablet 500 mg  500 mg Oral BID Ronnette Juniper, MD   500 mg at 02/08/21 0929   memantine (NAMENDA) tablet 10 mg  10 mg Oral BID Ronnette Juniper, MD   10 mg at 02/08/21 4742   multivitamin with minerals tablet 1 tablet  1 tablet Oral Q breakfast Ronnette Juniper, MD   1 tablet at 02/08/21 0929   ondansetron (ZOFRAN) tablet 4 mg  4 mg Oral Q6H PRN Ronnette Juniper, MD       Or   ondansetron [facility]) injection 4 mg  4 mg Intravenous Q6H PRN Ronnette Juniper, MD   4 mg at 02/05/21 0540   pantoprazole (PROTONIX) EC tablet 40 mg  40 mg Oral BID Hammons, Kimberly B, RPH   40 mg at 02/08/21 0929   polyethylene glycol (MIRALAX / GLYCOLAX) packet 17 g  17 g Oral Daily PRN Ronnette Juniper, MD       potassium chloride SA (KLOR-CON M) CR tablet 20 mEq  20 mEq Oral BID Ronnette Juniper, MD   20 mEq at 02/08/21 5956   sodium chloride flush (NS) 0.9 % injection 10-40 mL  10-40 mL Intracatheter Q12H Velna Ochs, MD   10 mL at 02/08/21 0930   sodium chloride flush (NS) 0.9 % injection 10-40 mL  10-40 mL Intracatheter PRN Velna Ochs, MD       Facility-Administered Medications Ordered in Other Encounters  Medication Dose Route Frequency Provider Last Rate Last Admin   sodium chloride 0.9 % injection 10 mL  10 mL Intracatheter PRN Truitt Merle, MD       sodium chloride flush (NS) 0.9 %  injection 10 mL  10 mL Intracatheter PRN Truitt Merle, MD   10 mL at 08/18/19 1717     Discharge Medications: Please see discharge summary for a list of discharge medications.  Relevant Imaging Results:  Relevant Lab Results:   Additional Information SSN: [id number]. Pt was vaccinated on Dec 14th in hospital received bivalent booster  Carolin Sicks, Latanya Presser

## 2021-02-08 NOTE — TOC Initial Note (Addendum)
Transition of Care [facility]) - Initial/Assessment Note    Patient Details  Name: [name] MRN: [id number] Date of Birth: Jan 17, 1941  [facility] CM/SW Contact:    Gabrielle Dare Phone Number: 02/08/2021, 10:22 AM  Clinical Narrative:                 CSW met at bedside of pt.  Pt is pleasantly confused.  Pt directed CSW to contact spouse.  CSW contacted spouse by phone.  Pt's spouse want what is best for pt.  CSW contacted pt's daughter Huey Romans.  Pt's daughter stated pt's spouse is not medically able to take care of pt at home.  Pt's daughter also has medically challenges.  Pt's daughter would prefer for pt to go to a SNF.  Prior to being hospitalized pt was at [facility].  Pt daughter would like to seek out other SNF for pt but will return to [facility] if their are no other options. Daughter is working on paperwork for [employer]. TOC will assist with disposition planning.  Expected Discharge Plan: Skilled Nursing Facility Barriers to Discharge: Ship broker, Continued Medical Work up, SNF Pending bed offer   Patient Goals and CMS Choice Patient states their goals for this hospitalization and ongoing recovery are:: To return to [facility] or another option CMS Medicare.gov Compare Post Acute Care list provided to:: Other (Comment Required) Choice offered to / list presented to : Adult Children  Expected Discharge Plan and Services Expected Discharge Plan: [place]   Discharge Planning Services: CM Consult   Living arrangements for the past 2 months: [place]                                      Prior Living Arrangements/Services Living arrangements for the past 2 months: [place] Lives with:: Facility Resident Patient language and need for interpreter reviewed:: Yes Do you feel safe going back to the place where you live?: Yes      Need for Family Participation in Patient Care: Yes  (Comment) Care giver support system in place?: Yes (comment)   Criminal Activity/Legal Involvement Pertinent to Current Situation/Hospitalization: No - Comment as needed  Activities of Daily Living Home Assistive Devices/Equipment: None ADL Screening (condition at time of admission) Patient's cognitive ability adequate to safely complete daily activities?: Yes Is the patient deaf or have difficulty hearing?: Yes Does the patient have difficulty seeing, even when wearing glasses/contacts?: Yes Does the patient have difficulty concentrating, remembering, or making decisions?: Yes Patient able to express need for assistance with ADLs?: Yes Does the patient have difficulty dressing or bathing?: Yes Independently performs ADLs?: No Communication: Needs assistance Dressing (OT): Needs assistance Grooming: Needs assistance Feeding: Needs assistance Bathing: Needs assistance Toileting: Needs assistance In/Out Bed: Needs assistance Walks in Home: Needs assistance Does the patient have difficulty walking or climbing stairs?: Yes Weakness of Legs: Both Weakness of Arms/Hands: Both  Permission Sought/Granted Permission sought to share information with : Case Manager, Customer service manager, Family Supports Permission granted to share information with : Yes, Verbal Permission Granted  Share Information with NAME: Huey Romans  Permission granted to share info w AGENCY: Yes  Permission granted to share info w Relationship: Daughter  Permission granted to share info w Contact Information: Yes  Emotional Assessment Appearance:: Appears stated age Attitude/Demeanor/Rapport: Unable to Assess, Gracious Affect (typically observed): Unable  to Assess, Appropriate Orientation: : Oriented to Self, Oriented to Place, Oriented to  Time, Oriented to Situation Alcohol / Substance Use: Not Applicable Psych Involvement: No (comment)  Admission diagnosis:  Hyperammonemia ([place]) [E72.20] Altered  mental status [R41.82] Altered mental status, unspecified altered mental status type [R41.82] AMS (altered mental status) [R41.82] GI bleed [K92.2] Patient Active Problem List   Diagnosis Date Noted   AMS (altered mental status) 02/04/2021   Altered mental status 02/03/2021   AKI (acute kidney injury) ([place]) 01/28/2021   GI bleed 01/18/2021   Bullous lesion 10/08/2020   Hearing loss 05/29/2020   Neuropathy 07/27/2019   Hyperkalemia 10/18/2018   Port-A-Cath in place 05/23/2018   Iron deficiency anemia due to chronic blood loss 04/19/2018   Acute right hip pain 03/10/2018   Hyperpigmented skin lesion 02/07/2018   Leg weakness, bilateral 12/23/2017   Hematemesis 12/04/2017   Pain localized to upper abdomen 10/05/2017   Lower extremity pain, bilateral 07/15/2017   Pulmonary HTN ([place]) 04/01/2017   Heart murmur 04/01/2017   Overgrown toenails 03/18/2017   Osteopenia after menopause 01/13/2017   Epistaxis 07/30/2016   Venous insufficiency of both lower extremities 04/27/2016   Anemia due to chronic blood loss    Dry skin 02/17/2016   Peripheral vascular disease ([place]) 12/30/2015   Pain in joint of right shoulder 10/22/2015   PAF (paroxysmal atrial fibrillation) ([place]) 48/02/6551   Lichen planus 74/82/7078   Healthcare maintenance 06/20/2015   Seborrheic keratosis 06/20/2015   Memory loss 01/30/2015   Simple febrile convulsions ([place]) 10/21/2014   Hyperammonemia ([place]) 10/03/2014   CKD (chronic kidney disease), stage III ([name]    Seizures ([place]) 09/2014   Acute kidney injury ([place]) 08/27/2014   Chronic diastolic CHF (congestive heart failure) ([place]) 10/03/2013   Acute on chronic heart failure with preserved ejection fraction (HFpEF) ([place]) 10/03/2013   Type 2 diabetes mellitus without complication ([place]) 67/54/4920   HTN (hypertension), benign 03/02/2012   Fatigue 07/10/2011   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 [place] [address] Phone: [phone] Fax: [phone]     Social Determinants of Health (SDOH) Interventions    Readmission Risk Interventions No flowsheet data found.

## 2021-02-09 ENCOUNTER — Encounter (HOSPITAL_COMMUNITY): Payer: Self-pay | Admitting: Gastroenterology

## 2021-02-09 LAB — CBC
HCT: 24.7 % — ABNORMAL LOW (ref 36.0–46.0)
Hemoglobin: 8.3 g/dL — ABNORMAL LOW (ref 12.0–15.0)
MCH: 31.7 pg (ref 26.0–34.0)
MCHC: 33.6 g/dL (ref 30.0–36.0)
MCV: 94.3 fL (ref 80.0–100.0)
Platelets: 108 10*3/uL — ABNORMAL LOW (ref 150–400)
RBC: 2.62 MIL/uL — ABNORMAL LOW (ref 3.87–5.11)
RDW: 14.9 % (ref 11.5–15.5)
WBC: 5.4 10*3/uL (ref 4.0–10.5)
nRBC: 0 % (ref 0.0–0.2)

## 2021-02-09 LAB — COMPREHENSIVE METABOLIC PANEL
ALT: 17 U/L (ref 0–44)
AST: 30 U/L (ref 15–41)
Albumin: 2.1 g/dL — ABNORMAL LOW (ref 3.5–5.0)
Alkaline Phosphatase: 76 U/L (ref 38–126)
Anion gap: 7 (ref 5–15)
BUN: 9 mg/dL (ref 8–23)
CO2: 21 mmol/L — ABNORMAL LOW (ref 22–32)
Calcium: 8.4 mg/dL — ABNORMAL LOW (ref 8.9–10.3)
Chloride: 112 mmol/L — ABNORMAL HIGH (ref 98–111)
Creatinine, Ser: 1.26 mg/dL — ABNORMAL HIGH (ref 0.44–1.00)
GFR, Estimated: 43 mL/min — ABNORMAL LOW (ref >60)
Glucose, Bld: 204 mg/dL — ABNORMAL HIGH (ref 70–99)
Potassium: 4.9 mmol/L (ref 3.5–5.1)
Sodium: 140 mmol/L (ref 135–145)
Total Bilirubin: 0.8 mg/dL (ref 0.3–1.2)
Total Protein: 4 g/dL — ABNORMAL LOW (ref 6.5–8.1)

## 2021-02-09 LAB — GLUCOSE, CAPILLARY
Glucose-Capillary: 176 mg/dL — ABNORMAL HIGH (ref 70–99)
Glucose-Capillary: 139 mg/dL — ABNORMAL HIGH (ref 70–99)
Glucose-Capillary: 251 mg/dL — ABNORMAL HIGH (ref 70–99)
Glucose-Capillary: 237 mg/dL — ABNORMAL HIGH (ref 70–99)
Glucose-Capillary: 203 mg/dL — ABNORMAL HIGH (ref 70–99)

## 2021-02-09 NOTE — Progress Notes (Signed)
HD#5 SUBJECTIVE:  Patient Summary: [name] is a 81 y.o. with a pertinent PMH of hereditary hemorrhagic telangiectasias, recent hospitalization with severe GI bleed from a gastric ulcer status post clipping, history of epilepsy, insulin-dependent type 2 diabetes mellitus, CKD stage III, hypertension, hyperlipidemia, dementia, admitted for altered mental status.   Overnight Events: I did speak with the patient's family on 02/08/2021 early evening.  I explained to them that at this time she is stable after the embolization procedure, however it is very likely that she will experience an additional bleed and that these can be life-threatening.  I explained that her elevated ammonia level on admission may have been related to her acute GI bleed, and that we do not generally recheck these levels.  I explained that I am reassured by the fact that she has not had altered mental status recur.  I also explained that the questionable imaging finding of lung mass versus pneumonia was currently being treated with antibiotic therapy, and that this would need to be followed up outpatient in 4 to 6 weeks to further evaluate which of the 2 possibilities it is.  I explained that the incidental finding on her spleen would also need to be followed up as an outpatient.  There are questions regarding her renal function, and I explained that her current lab results are around what her baseline is.  Concern was expressed regarding a cycle of the patient returning to a facility, becoming dehydrated and acutely ill, and being in the hospital.  I explained that unfortunately it is a fine line to balance adequate hydration without overloading her with fluids and that this is a concern due to her heart failure history.  I explained that it would not be appropriate to have IV fluids regularly/constantly administered and that the most appropriate step to take would be to encourage and remind her to stay hydrated and fed.  I was  asked about a PEG tube placement, to which I explained that she is not appropriate for this procedure.  Interim History: Patient continues to endorse feeling well.  She understands that at this time her discharge timeline is dependent on when she is able to get a bed at a rehab facility.  OBJECTIVE:  Vital Signs: Vitals:   02/08/21 2006 02/08/21 2321 02/09/21 0415 02/09/21 0749  BP: 140/61 (!) 150/52 (!) 137/54 133/62  Pulse: 66 61 (!) 56 (!) 59  Resp: 17 20 14 16   Temp: 98.3 F (36.8 C) 98.5 F (36.9 C) 97.6 F (36.4 C) 98 F (36.7 C)  TempSrc: Oral Oral Oral Oral  SpO2: 95% 96% 97% 97%  Weight:      Height:       Supplemental O2: Room Air SpO2: 97 % O2 Flow Rate (L/min): 2 L/min  Filed Weights   02/03/21 0935 02/04/21 2034 02/06/21 0820  Weight: 59 kg 64 kg 64 kg     Intake/Output Summary (Last 24 hours) at 02/09/2021 8938 Last data filed at 02/09/2021 0750 Gross per 24 hour  Intake 360 ml  Output 1500 ml  Net -1140 ml   Net IO Since Admission: 251.43 mL [02/09/21 0833]  Physical Exam: Constitutional: Elderly female resting comfortably in bed, no acute distress noted. Cardio: Regular rate and rhythm.  No murmurs, rubs, gallops. Pulm: Clear to auscultation bilaterally.  Normal work of breathing on room air. Abdomen: Soft, nontender, nondistended. MSK: Negative for extremity edema.  Distal pulses normal. Skin: Skin is warm and dry. Neuro: Alert and  oriented x3.  No focal deficit noted. Psych: Normal mood and affect.  Patient Lines/Drains/Airways Status     Active Line/Drains/Airways     Name Placement date Placement time Site Days   Implanted Port 04/13/18 Right Chest 04/13/18  0855  Chest  1033   Implanted Port Right Chest --  --  Chest  --   Peripheral IV 02/03/21 22 G Right Hand 02/03/21  1026  Hand  6   Peripheral IV 02/05/21 22 G Anterior;Right Wrist 02/05/21  0835  Wrist  4   Peripheral IV 02/05/21 22 G 1" Left;Posterior;Lateral;Upper Forearm 02/05/21   1418  Forearm  4   External Urinary Catheter 01/18/21  0722  --  22   External Urinary Catheter 01/28/21  1940  --  12   External Urinary Catheter 02/03/21  1309  --  6   Incision (Closed) 02/05/21 Groin Right 02/05/21  1013  -- 4             ASSESSMENT/PLAN:  Assessment: Principal Problem:   AMS (altered mental status) Active Problems:   GI bleed   Altered mental status   Plan: [name] is a 81 y.o. with a pertinent PMH of hereditary hemorrhagic telangiectasias, recent hospitalization with severe GI bleed from a gastric ulcer status post clipping, history of epilepsy, insulin-dependent type 2 diabetes mellitus, CKD stage III, hypertension, hyperlipidemia, dementia, admitted for altered mental status.    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  She is currently receiving Protonix, Zofran.  -Monitor closely for any signs of recurrent bleeding.   -Trend CBC -Of note, given evidence of iron deposition in liver, spleen, and marrow spaces, we will need to be judicious with blood transfusions.  -Transfuse if Hgb <7 and hemodynamically unstable   #Hyperammonemia Ammonia level on admission was elevated to 188, normal LFTs.  Imaging to this point has noted signs of iron deposition in the liver, spleen, and marrow spaces.  Follow-up CT of the abdomen showed hepatic steatosis.  It is possible that her acute GI bleed contributed to elevated ammonia levels. -Continue to monitor.  If recurrence of altered mental status, consider checking additional ammonia level and giving lactulose.   #Altered mental status, resolved #Reported history of dementia Patient continues to be alert and oriented x3 with  improvement of AKI and resolution of acute on chronic GI bleed. -Continue to monitor.   #AKI on CKD stage IIIa AKI continues to progress toward resolution, with serum Cr around baseline at this time. -Avoid nephrotoxic agents -Trend renal function   #History of epilepsy Continue home Keppra.   #Type 2 diabetes mellitus with insulin dependence #Diabetic neuropathy Blood glucose measurements have been very labile over the last several days.  Unfortunately the patient does not seem to be eating much of her meals but I have encouraged her to try to eat more of her meals which she is agreeable to. -Diabetes coordinator following, appreciate her assistance.             -Continue Levemir 8 units daily -NovoLog sliding scale insulin every 8 hours -QHS correction -CBG monitoring before meals and nightly and if concerns of hypoglycemia   #HFpEF Most recent echo in August 2022 showed EF of 65 to 38%, grade 2 diastolic dysfunction and severe left  atrial enlargement.  She is also been noted to have a pericardial effusion which is concerning for cardiac involvement no hemochromatosis as well.  Patient has home torsemide 20 mg daily as needed.  She is clinically euvolemic on exam. -Continue holding home torsemide 20 mg daily   #Hypertension Normotensive at this time.  We are holding home amlodipine 2.5 mg daily. -Continue holding home amlodipine 2.5 mg daily   #Left upper lobe consolidation CT chest was remarkable for rounded left upper lobe consolidation favoring focal pneumonia over neoplasm with recommendation of follow-up imaging in 6 to 8 weeks to assess for resolution of focal pneumonia versus rule out neoplasm.  Patient was previously on IV ceftriaxone 2 g daily for concern for lobar pneumonia. -Continue cefdinir 3 to milligrams p.o. 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  CT also showed signs of iron deposition in the spleen. -Incidental finding, follow-up as outpatient   Best Practice: Diet: Regular diet IVF: Fluids: LR, Rate:  100cc/hr VTE: Place and maintain sequential compression device Start: 02/07/21 0653 Code: Full AB: Cefdinir 300 mg p.o. daily Therapy Recs: SNF, DME: none DISPO: Anticipated discharge to Skilled nursing facility pending placement.  Signature: Farrel Gordon, D.O.  Internal Medicine Resident, PGY-1 [name]  Pager: [phone] 8:33 AM, 02/09/2021   Please contact the on call pager after 5 pm and on weekends at [phone].

## 2021-02-09 NOTE — TOC Progression Note (Signed)
Transition of Care [facility]) - Progression Note    Patient Details  Name: [name] MRN: [id number] Date of Birth: [birth year]  Transition [facility]) CM/SW [place], LCSW Phone Number:[phone] 02/09/2021, 2:32 PM  Clinical Narrative:     CSW attempted to follow up with pt's daughter in regards to the 3 bed offers that have come back however was unable to reach her or leave a voicemail due to it not being set up.  TOC team will continue to assist with discharge planning needs.   Expected Discharge Plan: Skilled Nursing Facility Barriers to Discharge: Ship broker, Continued Medical Work up, SNF Pending bed offer  Expected Discharge Plan and Services Expected Discharge Plan: [place]   Discharge Planning Services: CM Consult   Living arrangements for the past 2 months: Skilled Nursing Facility                                       Social Determinants of Health (SDOH) Interventions    Readmission Risk Interventions No flowsheet data found.

## 2021-02-10 DIAGNOSIS — E7220 Disorder of urea cycle metabolism, unspecified: Secondary | ICD-10-CM | POA: Diagnosis not present

## 2021-02-10 LAB — COMPREHENSIVE METABOLIC PANEL
ALT: 16 U/L (ref 0–44)
AST: 26 U/L (ref 15–41)
Albumin: 2 g/dL — ABNORMAL LOW (ref 3.5–5.0)
Alkaline Phosphatase: 76 U/L (ref 38–126)
Anion gap: 6 (ref 5–15)
BUN: 6 mg/dL — ABNORMAL LOW (ref 8–23)
CO2: 25 mmol/L (ref 22–32)
Calcium: 8.3 mg/dL — ABNORMAL LOW (ref 8.9–10.3)
Chloride: 111 mmol/L (ref 98–111)
Creatinine, Ser: 1.14 mg/dL — ABNORMAL HIGH (ref 0.44–1.00)
GFR, Estimated: 49 mL/min — ABNORMAL LOW (ref >60)
Glucose, Bld: 115 mg/dL — ABNORMAL HIGH (ref 70–99)
Potassium: 4.6 mmol/L (ref 3.5–5.1)
Sodium: 142 mmol/L (ref 135–145)
Total Bilirubin: 1 mg/dL (ref 0.3–1.2)
Total Protein: 4 g/dL — ABNORMAL LOW (ref 6.5–8.1)

## 2021-02-10 LAB — CBC
HCT: 25.5 % — ABNORMAL LOW (ref 36.0–46.0)
Hemoglobin: 8.3 g/dL — ABNORMAL LOW (ref 12.0–15.0)
MCH: 30.9 pg (ref 26.0–34.0)
MCHC: 32.5 g/dL (ref 30.0–36.0)
MCV: 94.8 fL (ref 80.0–100.0)
Platelets: 114 10*3/uL — ABNORMAL LOW (ref 150–400)
RBC: 2.69 MIL/uL — ABNORMAL LOW (ref 3.87–5.11)
RDW: 14.7 % (ref 11.5–15.5)
WBC: 4.9 10*3/uL (ref 4.0–10.5)
nRBC: 0 % (ref 0.0–0.2)

## 2021-02-10 LAB — GLUCOSE, CAPILLARY
Glucose-Capillary: 107 mg/dL — ABNORMAL HIGH (ref 70–99)
Glucose-Capillary: 113 mg/dL — ABNORMAL HIGH (ref 70–99)
Glucose-Capillary: 127 mg/dL — ABNORMAL HIGH (ref 70–99)
Glucose-Capillary: 138 mg/dL — ABNORMAL HIGH (ref 70–99)
Glucose-Capillary: 159 mg/dL — ABNORMAL HIGH (ref 70–99)
Glucose-Capillary: 218 mg/dL — ABNORMAL HIGH (ref 70–99)

## 2021-02-10 NOTE — Progress Notes (Signed)
Physical Therapy Treatment Patient Details Name: [name] MRN: [id number] DOB: 05/01/1940 Today's Date: 02/10/2021   History of Present Illness Pt adm 12/26 from SNF with AMS in setting of AKI or hyperammonemia . On 12/28 pt vomiting large blood clots. CT showed recurrent active gastric hemorrhage from known ulcer. Pt underwent mesenteric angiogram and left gastric embolization on 12/28.  PMH of hereditary hemorrhagic telangiectasias, GI bleeding, seizures, DM , CKD, HTN, and dementia.    PT Comments    Patient progressing slowly due to pain in her legs/feet and limited activity tolerance.  She was able to complete toileting on [facility] since having difficulty on bed pan.  Patient remains appropriate for SNF level rehab.  PT will continue to follow acutely.    Recommendations for follow up therapy are one component of a multi-disciplinary discharge planning process, led by the attending physician.  Recommendations may be updated based on patient status, additional functional criteria and insurance authorization.  Follow Up Recommendations  Skilled nursing-short term rehab (<3 hours/day)     Assistance Recommended at Discharge Frequent or constant Supervision/Assistance  Equipment Recommendations  None recommended by PT    Recommendations for Other Services       Precautions / Restrictions Precautions Precautions: Fall     Mobility  Bed Mobility Overal bed mobility: Needs Assistance Bed Mobility: Rolling;Sidelying to Sit Rolling: Min assist Sidelying to sit: Min assist       General bed mobility comments: assist for removing bed pan and lifting trunk, cues to use rail    Transfers Overall transfer level: Needs assistance Equipment used: Rolling walker (2 wheels) Transfers: Sit to/from Stand;Bed to chair/wheelchair/BSC Sit to Stand: Mod assist;+2 safety/equipment     Step pivot transfers: Mod assist;+2 physical assistance;+2 safety/equipment     General transfer  comment: assist to step with pt flexed and reporting pain in legs to get to [facility] assist to guide hips as attempting to sit prior to getting close enough to [facility]; stood from [facility] and +2 A to remove BSC and place recliner    Ambulation/Gait                   Stairs             Wheelchair Mobility    Modified Rankin (Stroke Patients Only)       Balance Overall balance assessment: Needs assistance Sitting-balance support: No upper extremity supported;Feet supported Sitting balance-Leahy Scale: Fair     Standing balance support: Bilateral upper extremity supported;Reliant on assistive device for balance Standing balance-Leahy Scale: Poor Standing balance comment: walker and min assist for static standing while assisted for hygiene                            Cognition Arousal/Alertness: Awake/alert Behavior During Therapy: WFL for tasks assessed/performed Overall Cognitive Status: History of cognitive impairments - at baseline Area of Impairment: Memory;Safety/judgement;Problem solving                     Memory: Decreased short-term memory   Safety/Judgement: Decreased awareness of safety   Problem Solving: Slow processing;Requires verbal cues General Comments: Pt pleasant and dx of dementia. Pt following simple commands.        Exercises      General Comments        Pertinent Vitals/Pain Pain Assessment: Faces Faces Pain Scale: Hurts even more Pain Location: LE's in standing Pain Descriptors / Indicators: Grimacing;Moaning;Aching  Pain Intervention(s): Monitored during session;Repositioned;Limited activity within patient's tolerance    Home Living                          Prior Function            PT Goals (current goals can now be found in the care plan section) Progress towards PT goals: Progressing toward goals    Frequency    Min 2X/week      PT Plan Current plan remains appropriate    Co-evaluation               AM-PAC PT "6 Clicks" Mobility   Outcome Measure  Help needed turning from your back to your side while in a flat bed without using bedrails?: A Kirt Help needed moving from lying on your back to sitting on the side of a flat bed without using bedrails?: A Jacque Help needed moving to and from a bed to a chair (including a wheelchair)?: Total Help needed standing up from a chair using your arms (e.g., wheelchair or bedside chair)?: Total Help needed to walk in hospital room?: Total Help needed climbing 3-5 steps with a railing? : Total 6 Click Score: 10    End of Session Equipment Utilized During Treatment: Gait belt Activity Tolerance: Patient limited by pain Patient left: in chair;with call bell/phone within reach;with chair alarm set   PT Visit Diagnosis: Other abnormalities of gait and mobility (R26.89);Muscle weakness (generalized) (M62.81);Pain Pain - part of body: Ankle and joints of foot     Time: 1432-1500 PT Time Calculation (min) (ACUTE ONLY): 28 min  Charges:  $Therapeutic Activity: 23-37 mins                     Magda Kiel, PT Acute Rehabilitation Services QHUTM:[phone] [phone] 02/10/2021    Reginia Naas 02/10/2021, 5:22 PM

## 2021-02-10 NOTE — TOC Progression Note (Addendum)
Transition of Care (TOC) - Progression Note  ° ° °Patient Details  °Name: [name] °MRN: [id number] °Date of Birth: 02/15/1940 ° °Transition of Care (TOC) CM/SW Contact  ° N , LCSW °Phone Number: °02/10/2021, 5:20 PM ° °Clinical Narrative:    °CSW met with patient and her spouse,[name]. CSW introduced self and explained role. Patient and spouse both agreeable to returning to [facility]. They express no concerns with returning. Patient's spouse requested CSW to call once confirmation is received from [facility].  ° °CSW contacted [facility]- left voice message for facility to return call. ° °CSW will continue to follow and assist with discharge planning.  CSW will start insurance authorization for SNF tomorrow, now that patient has updated PT note. ° ° , MSW, LCSW °Clinical Social Worker ° ° ° ° °Expected Discharge Plan: Skilled Nursing Facility °Barriers to Discharge: Insurance Authorization, Continued Medical Work up, SNF Pending bed offer ° °Expected Discharge Plan and Services °Expected Discharge Plan: Skilled Nursing Facility °  °Discharge Planning Services: CM Consult °  °Living arrangements for the past 2 months: Skilled Nursing Facility °                °  °  °  °  °  °  °  °  °  °  ° ° °Social Determinants of Health (SDOH) Interventions °  ° °Readmission Risk Interventions °No flowsheet data found. ° °

## 2021-02-10 NOTE — Progress Notes (Signed)
HD#6 SUBJECTIVE:  Patient Summary: [name] is a 81 y.o. with a pertinent PMH of hereditary hemorrhagic telangiectasias, recent hospitalization with severe GI bleed from a gastric ulcer status post clipping, history of epilepsy, insulin-dependent type 2 diabetes mellitus, CKD stage III, hypertension, hyperlipidemia, dementia, admitted for altered mental status.   Overnight Events: None  Interim History: Patient continues to endorse feeling well.  Denies any further bleeding episodes. Denies dizziness, chest pain, abdominal pain.  She understands that at this time her discharge timeline is dependent on when she is able to get a bed at a rehab facility.   OBJECTIVE:  Vital Signs: Vitals:   02/09/21 1107 02/09/21 1558 02/09/21 2100 02/10/21 0400  BP: (!) 123/47 134/89 (!) 118/54 (!) 116/48  Pulse: 66 (!) 58 (!) 59 (!) 58  Resp: 17 17 15 14   Temp: 97.9 F (36.6 C) 97.9 F (36.6 C) 98.4 F (36.9 C) 97.9 F (36.6 C)  TempSrc: Oral Oral Oral Oral  SpO2: 100% 100% 100% 100%  Weight:      Height:       Supplemental O2: Room Air SpO2: 100 % O2 Flow Rate (L/min): 2 L/min  Filed Weights   02/03/21 0935 02/04/21 2034 02/06/21 0820  Weight: 59 kg 64 kg 64 kg     Intake/Output Summary (Last 24 hours) at 02/10/2021 0706 Last data filed at 02/09/2021 1800 Gross per 24 hour  Intake 480 ml  Output 1550 ml  Net -1070 ml    Net IO Since Admission: -318.57 mL [02/10/21 0706]  Physical Exam: Constitutional: Elderly female resting comfortably in bed, no acute distress noted. Cardio: Regular rate and rhythm.  No murmurs, rubs, gallops. Pulm: Clear to auscultation bilaterally.  Normal work of breathing on room air. Abdomen: Soft, nontender, nondistended. MSK: Negative for extremity edema.  Distal pulses normal. Skin: Skin is warm and dry. Neuro: Alert and oriented x3.  No focal deficit noted. Psych: Normal mood and affect.  Patient Lines/Drains/Airways Status     Active  Line/Drains/Airways     Name Placement date Placement time Site Days   Implanted Port 04/13/18 Right Chest 04/13/18  0855  Chest  1033   Implanted Port Right Chest --  --  Chest  --   Peripheral IV 02/03/21 22 G Right Hand 02/03/21  1026  Hand  6   Peripheral IV 02/05/21 22 G Anterior;Right Wrist 02/05/21  0835  Wrist  4   Peripheral IV 02/05/21 22 G 1" Left;Posterior;Lateral;Upper Forearm 02/05/21  1418  Forearm  4   External Urinary Catheter 01/18/21  0722  --  22   External Urinary Catheter 01/28/21  1940  --  12   External Urinary Catheter 02/03/21  1309  --  6   Incision (Closed) 02/05/21 Groin Right 02/05/21  1013  -- 4             ASSESSMENT/PLAN:  Assessment: Principal Problem:   AMS (altered mental status) Active Problems:   GI bleed   Altered mental status  [name] is a 81 y.o. with a pertinent PMH of hereditary hemorrhagic telangiectasias, recent hospitalization with severe GI bleed from a gastric ulcer status post clipping, history of epilepsy, insulin-dependent type 2 diabetes mellitus, CKD stage III, hypertension, hyperlipidemia, dementia, admitted for altered mental status.    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 She is currently receiving Protonix, Zofran.  -Monitor closely for any signs of recurrent bleeding.   -Trend CBC -Of note, given evidence of iron deposition in liver, spleen, and marrow spaces, we will need to be judicious with blood transfusions.  -Transfuse if Hgb <7 and hemodynamically unstable   #Hyperammonemia Ammonia level on admission was elevated to 188, normal LFTs. Imaging to this point has noted signs of iron deposition in the liver, spleen, and  marrow spaces.  Follow-up CT of the abdomen showed hepatic steatosis.  It is possible that her acute GI bleed contributed to elevated ammonia levels. -Continue to monitor.  If recurrence of altered mental status, consider checking additional ammonia level and giving lactulose.   #Altered mental status, resolved #Reported history of dementia Patient continues to be alert and oriented x3 with improvement of AKI and resolution of acute on chronic GI bleed. -Continue to monitor.   #AKI on CKD stage IIIa AKI continues to progress toward resolution, with serum Cr around baseline at this time. -Avoid nephrotoxic agents -Trend renal function   #History of epilepsy Continue home Keppra.   #Type 2 diabetes mellitus with insulin dependence #Diabetic neuropathy Blood glucose measurements have been very labile over the last several days. Most recent CBG 127. -Diabetes coordinator following, appreciate her assistance.             -Continue Levemir 8 units daily -NovoLog sliding scale insulin every 8 hours -QHS correction -CBG monitoring before meals and nightly and if concerns of hypoglycemia   #HFpEF Most recent echo in August 2022 showed EF of 65 to 14%, grade 2 diastolic dysfunction and severe left atrial enlargement.  She is also been noted to have a pericardial effusion which is concerning for cardiac involvement no hemochromatosis as well.  Patient has home torsemide 20 mg daily as needed.  She is clinically euvolemic on exam. -Continue holding home torsemide 20 mg daily   #Hypertension Normotensive at this time. We are holding home amlodipine 2.5 mg daily. -Continue holding home amlodipine 2.5 mg daily   #Left upper lobe consolidation CT chest was remarkable for rounded left upper lobe consolidation favoring focal pneumonia over neoplasm with recommendation of follow-up imaging in 6 to 8 weeks to assess for resolution of focal pneumonia versus rule out neoplasm.  Patient was previously on  IV ceftriaxone 2 g daily for concern for lobar pneumonia. -Continue cefdinir 3 to milligrams p.o. 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  CT also showed signs of iron deposition in the spleen. -Incidental finding, follow-up as outpatient   Best Practice: Diet: Regular diet VTE: Place and maintain sequential compression device Start: 02/07/21 0653 Code: Full AB: Cefdinir 300 mg p.o. daily, day 3/3 Therapy Recs: SNF, DME: none DISPO: Anticipated discharge to Skilled nursing facility pending placement.  Signature: Mitzie Na, M.D. Internal Medicine Resident, PGY-1 [name]  Pager: [phone] 7:06 AM, 02/10/2021   Please contact the on call pager after 5 pm and on weekends at [phone].

## 2021-02-10 NOTE — Care Management Important Message (Signed)
Important Message  Patient Details  Name: [name] MRN: [id number] Date of Birth: 10-06-1940   Medicare Important Message Given:  Yes     Braniyah Besse 02/10/2021, 2:42 PM

## 2021-02-10 NOTE — Progress Notes (Addendum)
Palliative:  HPI: 81 y.o. female  with past medical history of dementia, epilepsy, diastolic heart failure, hypertension, hyperlipidemia, iron deficiency anemia, pulmonary hypertension, hereditary hemorrhagic telangiectasias, gastric AVMs, GIB, CKD stage 3a, diabetes admitted on 02/03/2021 with altered mental status and AKI with recent admission with similar presentation and some improvement with IV fluids. Concern for chronically elevated ammonia levels and concern for potential liver disease - work up is ongoing. MRI with evidence of hemochromatosis.   I met again today at [name]'s bedside. No family present. She continues to be pleasant and without complaint. She continues with some underlying confusion regarding her condition at times and unable to make decisions for herself (she refers to her husband). She has made some improvements and is more stable for discharge. Plans to discharge to nursing facility. I attempted to reach husband to discuss code status further but unable to reach him. I have encouraged other family members to continue conversation. Recommend outpatient palliative to continue conversations.   All questions/concerns addressed. Emotional support provided. Discussed with RN.   Exam: Alert, oriented to person, place, somewhat to situation (fluctuating confusion). No distress. HR RRR. Breathing regular, unlabored. Abd soft.   Plan: - Recommend outpatient palliative to follow. Needs further discussion to align patients stated wishes with plan of care.   15 min   Vinie Sill, NP Palliative Medicine Team Pager [phone] (Please see [email or web address] for schedule) Team Phone [phone]    Greater than 50%  of this time was spent counseling and coordinating care related to the above assessment and plan

## 2021-02-11 DIAGNOSIS — R410 Disorientation, unspecified: Secondary | ICD-10-CM | POA: Diagnosis not present

## 2021-02-11 DIAGNOSIS — K922 Gastrointestinal hemorrhage, unspecified: Secondary | ICD-10-CM | POA: Diagnosis not present

## 2021-02-11 LAB — COMPREHENSIVE METABOLIC PANEL
ALT: 15 U/L (ref 0–44)
AST: 25 U/L (ref 15–41)
Albumin: 1.9 g/dL — ABNORMAL LOW (ref 3.5–5.0)
Alkaline Phosphatase: 77 U/L (ref 38–126)
Anion gap: 4 — ABNORMAL LOW (ref 5–15)
BUN: 7 mg/dL — ABNORMAL LOW (ref 8–23)
CO2: 24 mmol/L (ref 22–32)
Calcium: 8.1 mg/dL — ABNORMAL LOW (ref 8.9–10.3)
Chloride: 111 mmol/L (ref 98–111)
Creatinine, Ser: 1.24 mg/dL — ABNORMAL HIGH (ref 0.44–1.00)
Glucose, Bld: 212 mg/dL — ABNORMAL HIGH (ref 70–99)
Potassium: 4.8 mmol/L (ref 3.5–5.1)
Sodium: 139 mmol/L (ref 135–145)
Total Bilirubin: 0.6 mg/dL (ref 0.3–1.2)
Total Protein: 3.9 g/dL — ABNORMAL LOW (ref 6.5–8.1)

## 2021-02-11 LAB — CBC
HCT: 23.7 % — ABNORMAL LOW (ref 36.0–46.0)
Hemoglobin: 7.8 g/dL — ABNORMAL LOW (ref 12.0–15.0)
MCH: 31.3 pg (ref 26.0–34.0)
MCHC: 32.9 g/dL (ref 30.0–36.0)
MCV: 95.2 fL (ref 80.0–100.0)
Platelets: 112 10*3/uL — ABNORMAL LOW (ref 150–400)
RBC: 2.49 MIL/uL — ABNORMAL LOW (ref 3.87–5.11)
RDW: 14.6 % (ref 11.5–15.5)
WBC: 4.5 10*3/uL (ref 4.0–10.5)
nRBC: 0 % (ref 0.0–0.2)

## 2021-02-11 LAB — GLUCOSE, CAPILLARY
Glucose-Capillary: 136 mg/dL — ABNORMAL HIGH (ref 70–99)
Glucose-Capillary: 186 mg/dL — ABNORMAL HIGH (ref 70–99)
Glucose-Capillary: 195 mg/dL — ABNORMAL HIGH (ref 70–99)
Glucose-Capillary: 211 mg/dL — ABNORMAL HIGH (ref 70–99)
Glucose-Capillary: 237 mg/dL — ABNORMAL HIGH (ref 70–99)
Glucose-Capillary: 283 mg/dL — ABNORMAL HIGH (ref 70–99)

## 2021-02-11 LAB — RESP PANEL BY RT-PCR (FLU A&B, COVID) ARPGX2
Influenza A by PCR: NEGATIVE
Influenza B by PCR: NEGATIVE
SARS Coronavirus 2 by RT PCR: NEGATIVE

## 2021-02-11 NOTE — Progress Notes (Addendum)
HD#7 SUBJECTIVE:  Patient Summary: [name] is a 81 y.o. with a pertinent PMH of hereditary hemorrhagic telangiectasias, recent hospitalization with severe GI bleed from a gastric ulcer status post clipping, history of epilepsy, insulin-dependent type 2 diabetes mellitus, CKD stage III, hypertension, hyperlipidemia, dementia, admitted for altered mental status.   Overnight Events: None  Interim History: Patient evaluated at bedside this AM. She continues to endorse feeling well.  Denies any further bleeding episodes. Denies dizziness, chest pain, abdominal pain.  She understands that at this time her discharge timeline is dependent on when she is able to get a bed at a rehab facility.   OBJECTIVE:  Vital Signs: Vitals:   02/10/21 1600 02/10/21 1953 02/11/21 0036 02/11/21 0406  BP: 118/60 (!) 111/53 (!) 114/40 (!) 115/42  Pulse: 61 (!) 57 (!) 59 (!) 51  Resp: 16 16 16 16   Temp: 97.9 F (36.6 C) 98.1 F (36.7 C) 98.4 F (36.9 C) 98.1 F (36.7 C)  TempSrc: Oral Oral Oral Oral  SpO2:  100% 100% 97%  Weight:      Height:       Supplemental O2: Room Air SpO2: 97 % O2 Flow Rate (L/min): 2 L/min  Filed Weights   02/03/21 0935 02/04/21 2034 02/06/21 0820  Weight: 59 kg 64 kg 64 kg     Intake/Output Summary (Last 24 hours) at 02/11/2021 0634 Last data filed at 02/10/2021 1953 Gross per 24 hour  Intake 250 ml  Output 1150 ml  Net -900 ml    Net IO Since Admission: -1,218.57 mL [02/11/21 0634]  Physical Exam: Constitutional: Elderly female resting comfortably in bed, no acute distress noted. Cardio: Regular rate and rhythm.  No murmurs, rubs, gallops. Pulm: Clear to auscultation bilaterally. Normal work of breathing on room air. Abdomen: Soft, nontender, nondistended. MSK: Negative for extremity edema. Distal pulses normal. Skin: Skin is warm and dry. Neuro: Alert and oriented x3.  No focal deficit noted. Psych: Normal mood and affect.  Patient Lines/Drains/Airways  Status     Active Line/Drains/Airways     Name Placement date Placement time Site Days   Implanted Port 04/13/18 Right Chest 04/13/18  0855  Chest  1033   Implanted Port Right Chest --  --  Chest  --   Peripheral IV 02/03/21 22 G Right Hand 02/03/21  1026  Hand  6   Peripheral IV 02/05/21 22 G Anterior;Right Wrist 02/05/21  0835  Wrist  4   Peripheral IV 02/05/21 22 G 1" Left;Posterior;Lateral;Upper Forearm 02/05/21  1418  Forearm  4   External Urinary Catheter 01/18/21  0722  --  22   External Urinary Catheter 01/28/21  1940  --  12   External Urinary Catheter 02/03/21  1309  --  6   Incision (Closed) 02/05/21 Groin Right 02/05/21  1013  -- 4             ASSESSMENT/PLAN:  Assessment: Principal Problem:   AMS (altered mental status) Active Problems:   GI bleed   Altered mental status  [name] is a 81 y.o. with a pertinent PMH of hereditary hemorrhagic telangiectasias, recent hospitalization with severe GI bleed from a gastric ulcer status post clipping, history of epilepsy, insulin-dependent type 2 diabetes mellitus, CKD stage III, hypertension, hyperlipidemia, dementia, admitted for altered mental status.    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 She is currently receiving Protonix, Zofran.  -Monitor closely for any signs of recurrent bleeding.   -Trend CBC -Of note, given evidence of iron deposition in liver, spleen, and marrow spaces, we will need to be judicious with blood transfusions.  -Transfuse if Hgb <7 and hemodynamically unstable   #Altered mental status, resolved #Reported history of dementia Patient continues to be alert and oriented x3 with improvement of AKI and  resolution of acute on chronic GI bleed. -Continue to monitor.   #AKI on CKD stage IIIa AKI continues to progress toward resolution, with serum Cr around baseline at this time. -Avoid nephrotoxic agents -Trend renal function   #Hyperammonemia Ammonia level on admission was elevated to 188, normal LFTs. Imaging to this point has noted signs of iron deposition in the liver, spleen, and marrow spaces. Follow-up CT of the abdomen showed hepatic steatosis. It is possible that her acute GI bleed contributed to elevated ammonia levels. -Continue to monitor.  If recurrence of altered mental status, consider checking additional ammonia level and giving lactulose.  #History of epilepsy Continue home Keppra.   #Type 2 diabetes mellitus with insulin dependence #Diabetic neuropathy Blood glucose measurements have been very labile over the last several days. Most recent CBG 195. -Diabetes coordinator following, appreciate her assistance.             -Continue Levemir 8 units daily -NovoLog sliding scale insulin every 8 hours -QHS correction -CBG monitoring before meals and nightly and if concerns of hypoglycemia   #HFpEF Most recent echo in August 2022 showed EF of 65 to 86%, grade 2 diastolic dysfunction and severe left atrial enlargement. She is also been noted to have a pericardial effusion which is concerning for cardiac involvement no hemochromatosis as well.  Patient has home torsemide 20 mg daily as needed. She is clinically euvolemic on exam. -Continue holding home torsemide 20 mg daily   #Hypertension Normotensive at this time. We are holding home amlodipine 2.5 mg daily. -Continue holding home amlodipine 2.5 mg daily   #Left upper lobe consolidation CT chest was remarkable for rounded left upper lobe consolidation favoring focal pneumonia over neoplasm with recommendation of follow-up imaging in 6 to 8 weeks to assess for resolution of focal pneumonia versus rule out neoplasm. Patient  was previously on IV ceftriaxone 2 g daily for concern for lobar pneumonia, most recently s/p 3 days of cefdinir.             - Will need repeat imaging in 4-6 weeks. 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  CT also showed signs of iron deposition in the spleen. -Incidental finding, follow-up as outpatient  Best Practice: Diet: Regular diet VTE: Place and maintain sequential compression device Start: 02/07/21 0653 Code: Full AB: None Therapy Recs: SNF, DME: none DISPO: Medically stable for discharge, anticipated discharge to Skilled nursing facility pending placement.  Signature: Mitzie Na, M.D. Internal Medicine Resident, PGY-1 [name]  Pager: [phone] 6:34 AM, 02/11/2021   Please contact the on call pager after 5 pm and on weekends at [phone].

## 2021-02-11 NOTE — Progress Notes (Signed)
Occupational Therapy Treatment Patient Details Name: [name] MRN: [id number] DOB: May 24, 1940 Today's Date: 02/11/2021   History of present illness Pt adm 12/26 from SNF with AMS in setting of AKI or hyperammonemia . On 12/28 pt vomiting large blood clots. CT showed recurrent active gastric hemorrhage from known ulcer. Pt underwent mesenteric angiogram and left gastric embolization on 12/28.  PMH of hereditary hemorrhagic telangiectasias, GI bleeding, seizures, DM , CKD, HTN, and dementia.   OT comments  OT treatment session with focus on self-care re-education, functional transfers, and mobility with use of RW. Patient making steady progress toward goals but continues to be limited by deficits listed below. OT will continue to follow acutely.    Recommendations for follow up therapy are one component of a multi-disciplinary discharge planning process, led by the attending physician.  Recommendations may be updated based on patient status, additional functional criteria and insurance authorization.    Follow Up Recommendations  Skilled nursing-short term rehab (<3 hours/day)    Assistance Recommended at Discharge Frequent or constant Supervision/Assistance  Patient can return home with the following      Equipment Recommendations  None recommended by OT    Recommendations for Other Services      Precautions / Restrictions Precautions Precautions: Fall Restrictions Weight Bearing Restrictions: No       Mobility Bed Mobility Overal bed mobility: Needs Assistance Bed Mobility: Rolling;Sidelying to Sit Rolling: Min assist Sidelying to sit: Min assist       General bed mobility comments: Min A at trunk. Able to complete anterior scoots toward EOB with with Min A, use of chuck pad and increased time/effort.    Transfers Overall transfer level: Needs assistance Equipment used: Rolling walker (2 wheels) Transfers: Sit to/from Stand;Bed to chair/wheelchair/BSC Sit to Stand:  Min assist   Step pivot transfers: Min assist       General transfer comment: Min A for sit to stand from EOB to RW with cues for hand placement. Min A stand-pivot to [facility].     Balance Overall balance assessment: Needs assistance Sitting-balance support: No upper extremity supported;Feet supported Sitting balance-Leahy Scale: Fair     Standing balance support: Bilateral upper extremity supported;Reliant on assistive device for balance;During functional activity Standing balance-Leahy Scale: Poor                             ADL either performed or assessed with clinical judgement   ADL Overall ADL's : Needs assistance/impaired     Grooming: Set up;Sitting;Cueing for sequencing                   Toilet Transfer: Minimal assistance;Cueing for safety;Cueing for sequencing;Ambulation Toilet Transfer Details (indicate cue type and reason): To BSC with Min A and use of RW. Cues for hand placmenet. Toileting- Clothing Manipulation and Hygiene: Moderate assistance;Cueing for sequencing;Cueing for safety;Sitting/lateral lean;Sit to/from stand Toileting - Clothing Manipulation Details (indicate cue type and reason): Mod A for hygiene/clothing management.            Extremity/Trunk Assessment              Vision       Perception     Praxis      Cognition Arousal/Alertness: Awake/alert Behavior During Therapy: WFL for tasks assessed/performed Overall Cognitive Status: History of cognitive impairments - at baseline Area of Impairment: Memory;Safety/judgement;Problem solving  Memory: Decreased short-term memory   Safety/Judgement: Decreased awareness of safety   Problem Solving: Slow processing;Requires verbal cues General Comments: Pleasant/participatory, follows 1-step verbal commands          Exercises     Shoulder Instructions       General Comments VSS on RA.    Pertinent Vitals/ Pain       Pain Assessment:  Faces Faces Pain Scale: Hurts Deringer more Pain Location: Bilateral shoulders Pain Descriptors / Indicators: Aching;Sore Pain Intervention(s): Limited activity within patient's tolerance;Monitored during session;Repositioned  Home Living                                          Prior Functioning/Environment              Frequency  Min 2X/week        Progress Toward Goals  OT Goals(current goals can now be found in the care plan section)  Progress towards OT goals: Progressing toward goals  Acute Rehab OT Goals Patient Stated Goal: No goals stated. OT Goal Formulation: With patient Time For Goal Achievement: 02/12/21 Potential to Achieve Goals: Good ADL Goals Pt Will Perform Grooming: with supervision;standing Pt Will Perform Lower Body Bathing: with supervision;sitting/lateral leans;sit to/from stand Pt Will Perform Upper Body Dressing: with modified independence;sitting Pt Will Perform Lower Body Dressing: with supervision;sitting/lateral leans;sit to/from stand Pt Will Transfer to Toilet: with supervision;ambulating Pt Will Perform Toileting - Clothing Manipulation and hygiene: with supervision;sitting/lateral leans;sit to/from stand Additional ADL Goal #1: Pt will tolerate 15 minutes of therapeutic activity without need for rest break indicating improved activity tolerance in prep for ADLs.  Plan Discharge plan remains appropriate;Frequency remains appropriate    Co-evaluation                 AM-PAC OT "6 Clicks" Daily Activity     Outcome Measure   Help from another person eating meals?: A [place] Help from another person taking care of personal grooming?: A [place] Help from another person toileting, which includes using toliet, bedpan, or urinal?: A Lot Help from another person bathing (including washing, rinsing, drying)?: A Lot Help from another person to put on and taking off regular upper body clothing?: A [place] Help from another  person to put on and taking off regular lower body clothing?: A Lot 6 Click Score: 15    End of Session Equipment Utilized During Treatment: Gait belt;Rolling walker (2 wheels)  OT Visit Diagnosis: Unsteadiness on feet (R26.81);Other abnormalities of gait and mobility (R26.89);Muscle weakness (generalized) (M62.81)   Activity Tolerance Patient tolerated treatment well   Patient Left in chair;with call bell/phone within reach;with chair alarm set   Nurse Communication Mobility status        Time: 6203-5597 OT Time Calculation (min): 32 min  Charges: OT General Charges $OT Visit: 1 Visit OT Treatments $Self Care/Home Management : 23-37 mins  Landree Fernholz H. OTR/L Supplemental OT, Department of rehab services [phone]  Teola Bradley 02/11/2021, 9:55 AM

## 2021-02-11 NOTE — Discharge Summary (Addendum)
Name: [name] MRN: [id number] DOB: 07-Jan-1941 81 y.o. PCP: Mike Craze, DO  Date of Admission: 02/03/2021  9:15 AM Date of Discharge: 02/12/2021 Attending Physician: Velna Ochs, MD  Discharge Diagnosis: 1. Acute Upper GI bleed complicated by hemorrhagic shock, s/p IR embolization 2. History of hereditary hemorrhagic telangiectasia syndrome 3. H/o multiple gastric ulcers 4. Iron deficiency anemia 5. Secondary Hemochromatosis 6. AKI on CKD stage IIIa, resolved 7. Hyperammonemia 8. H/o epilepsy 9. T2DM with insulin dependence, peripheral neuropathy 10. HFpEF 11. AMS, resolved 12. HTN 13. Left upper lobe consolidation 14. Splenic mass  Discharge Medications: Allergies as of 02/12/2021       Reactions   Aspirin Nausea And Vomiting        Medication List     STOP taking these medications    amLODipine 2.5 MG tablet Commonly known as: NORVASC   potassium chloride SA 20 MEQ tablet Commonly known as: KLOR-CON M   torsemide 20 MG tablet Commonly known as: DEMADEX       TAKE these medications    Accu-Chek Guide test strip Generic drug: glucose blood USE AS INSTRUCTED TO CHECK BLOOD SUGAR 3 TIMES DAILY. What changed: See the new instructions.   Accu-Chek Softclix Lancets lancets 1 each by Other route 3 (three) times daily. Use as instructed to check blood sugar 3 times per day dx code E11.65   diclofenac Sodium 1 % Gel Commonly known as: VOLTAREN Apply 2 g topically 4 (four) times daily. What changed:  when to take this reasons to take this   FreeStyle Libre 2 Sensor Misc 2 Devices by Does not apply route every 14 (fourteen) days.   gabapentin 100 MG capsule Commonly known as: NEURONTIN Take 1 capsule (100 mg total) by mouth 2 (two) times daily.   IMMUNE FORMULA PO Take 1 tablet by mouth daily.   insulin lispro 100 UNIT/ML KwikPen Commonly known as: HumaLOG KwikPen Inject 2 Units into the skin 3 (three) times daily. What changed:   how much to take how to take this when to take this additional instructions Another medication with the same name was removed. Continue taking this medication, and follow the directions you see here.   Insulin Lispro Prot & Lispro (75-25) 100 UNIT/ML Kwikpen Commonly known as: HumaLOG Mix 75/25 KwikPen Inject 12 Units into the skin every morning.   levETIRAcetam 500 MG tablet Commonly known as: KEPPRA Take 1 tablet (500 mg total) by mouth 2 (two) times daily.   memantine 10 MG tablet Commonly known as: NAMENDA TAKE 1 TABLET BY MOUTH TWICE DAILY   Multi For Her 50+ Tabs Take 1 tablet by mouth daily with breakfast.   pantoprazole 40 MG tablet Commonly known as: PROTONIX Take 1 tablet (40 mg total) by mouth 2 (two) times daily.   triamcinolone cream 0.1 % Commonly known as: KENALOG Apply 1 application topically 2 (two) times daily.   triamcinolone ointment 0.5 % Commonly known as: KENALOG APPLY 1 APPLICATION TOPICALLY TO RASH TWICE DAILY FOR ITCHING   Trulicity 1.5 HC/6.2BJ Sopn Generic drug: Dulaglutide Inject 1.5 mg into the skin every Thursday. Start taking on: February 13, 2021        Disposition and follow-up:   [name] was discharged from [facility] in Stable condition.  At the hospital follow up visit please address:  1.  Acute Upper GI bleed complicated by hemorrhagic shock, s/p IR embolization; Hx Hereditary hemorrhagic telangectasia syndrome: During her hospitalization, we have been judicious  with blood transfusions given evidence of iron deposition in liver, spleen, and marrow spaces. In the outpatient setting, the patient will need a consult to cardiology and hematology for her [place]. From a hematologic standpoint, patient would benefit from chelation therapy.  2. HTN and HFpEF: Both torsemide and amlodipine were held during this admission secondary to hemorrhagic shock. These have not yet been restarted. I have also held her potassium  given that her levels increased to 5.5 at DC (also gave 1x 5g of Lokelma prior to DC); she has not been on her torsemide. Will need to restart these medications when able.  3. T2DM: Discharged on home regimen.  4. Left upper lobe consolidation: Pt treated here for PNA (see below for more details). Pt will need follow-up imaging in 4-6 weeks. If no resolution on follow-up imaging, she may need a bronchoscopy.  Labs / imaging needed at time of follow-up:  CBC to recheck Hgb, CMP to recheck Cr and liver enzymes, glucose levels for her diabetes (no need to check A1c, her last one was 1 week ago). Could consider lipid panel as her most recent was ~ 2 years ago.  Pending labs/ test needing follow-up: None  Follow-up Appointments:   Hospital Course by problem list: [name] is a 81 y.o. with pertinent PMH of hereditary hemorrhagic telangiectasias, recent hospitalization with severe GI bleeding from a gastric ulcer s/p clipping, hx of epilepsy, Insulin dependent type 2 DM, CKD stage 3a, HTN, HLD and dementia who presented with altered mental status   #Altered mental status, resolved #Reported Hx of dementia Patient presented with altered mental status, was noted to not be eating or responding to questions at SNF, only responding to painful stimuli. Concern for infection as cause of AMS.  Lactate of 2.7 and CXR with questionable opacity, however, there was no leukocytosis, no fever, and unremarkable UA. Overall, infection seemed unlikely on admission, the patient did receive single doses of azithromycin and ceftriaxone in the ED. Serum chemistries were unremarkable for a metabolic cause aside from small AKI.  CTH negative and no focal neurologic deficits concerning for acute CVA. Ammonia was significantly elevated at 188 but the clinical significance of this is unclear, further work-up was initiated. Patient likely hypovolemic given her lactic acidosis and mild AKI. She received 1L IVF in the ED.   Patient received maintenance IVF with resolution of AKI.  Patient's mental status also improved following IVF, suggesting AKI, dehydration and poor p.o. fluid intake was significantly contributing to AMS. Back to mental status baseline.  Memantine 10 mg restarted this admission. Through the remaining duration of the admission, patient did not have recurrence of encephalopathy.   #Acute Upper GI bleed complicated by hemorrhagic shock, s/p IR embolization #Hx Hereditary hemorrhagic telangectasia syndrome #Hx of UGIB s/p clipping #Iron deficiency anemia #GERD On admission, hemoglobin 10.3, consistent with mild normocytic anemia, stable since prior hospitalization.  Protonix 40 mg twice daily was continued this admission.  Overnight 12/28 the patient experience new gastric hemorrhage as evidenced by large volume hematemesis with large blood clots, which was also noted on CT abdomen/pelvis which showed contrast leaking within the distal stomach adjacent to a prior hemostasis clip.  She underwent emergent IR embolization, during which procedure the left gastric artery was embolized.  She also received 2 units PRBC and fluids with appropriate response in hemoglobin and hemodynamic stability.  On 12/29 she underwent EGD which showed several nonbleeding gastric ulcers.  She had no further signs of acute bleeding and  remained hemodynamically stable with stable hemoglobin levels.  She will need to reestablish with cardiology for her hemochromatosis and will also need follow-up with hematology in the outpatient setting (for chelation therapy).   #AKI on CKD 3a, resolved Cr admission 1.6, baseline ~1.4.  Likely prerenal AKI secondary to dehydration from decreased p.o. intake.  Patient was treated with IV fluid resuscitation with improvement in creatinine to 1.2.  Kidney function back to baseline.  Held home potassium supplement, potassium appropriate this admission.  Unfortunately after recurrent gastric hemorrhage,  AKI recurred secondary to hypovolemia.  This gradually resolved to baseline with fluid resuscitation and control of gastric bleed.  #Hyperammonemia #Pronounced echogenicity in right hepatic lobe Patient has presented and previously been hospitalized for AMS in the setting of AKI or hyperammonemia.  Ammonia level significantly elevated this admission 188.  LFTs normal.  Given multiple hospitalizations for similar findings, we did pursue RUQ US imaging to evaluate for liver disease. Imaging showed pronounced echogenicity in the right hepatic lobe concerning for liver disease versus vascular anomaly. Radiology recommended further imaging via MRI/CT which showed no evidence of metastatic disease.     #Hx epilepsy Patient was continued on home Keppra.     #Type 2 diabetes with insulin dependence #Diabetic neuropathy Last A1c 6.2%, patient was managed with short and long-acting insulin throughout her admission.  #HFpEF Last echo done in August 2022 with EF of 65 to 70%. G2DD and severe left atrial enlargement. Patient on torsemide 20 mg daily PRN which was held secondary to hemorrhagic shock and clinical euvolemia on resolution of shock.   #Hypertension Patient takes amlodipine 2.5 mg daily at home. Held in the setting of AMS on admission without being restarted secondary to hemorrhagic shock.  #Splenic mass MRI liver noted variable signal in the spleen raising the question of underlying splenic lesion, and CT abdomen showed well-circumscribed 3.4 cm splenic mass favoring hemangioma given decreased prominence on delayed imaging.  CT also showed signs of iron deposition in the spleen. Incidental finding, likely hemangioma.  #Left upper lobe consolidation CT chest was remarkable for rounded left upper lobe consolidation favoring focal pneumonia over neoplasm with recommendation of follow-up imaging in 6 to 8 weeks to assess for resolution of focal pneumonia versus rule out neoplasm.  Patient was  previously on IV ceftriaxone 2 g daily for concern for lobar pneumonia. She received a course of cefdinir during this hospitalization as well. Pt will need follow-up imaging in 4-6 weeks. If no resolution on follow-up in 4-6 weeks, she may need a bronchoscopy.  Discharge Exam:   BP (!) 122/51 (BP Location: Right Arm)    Pulse (!) 51    Temp 97.9 F (36.6 C) (Oral)    Resp 15    Ht 5\' 3"  (1.6 m)    Wt 64 kg    LMP  (LMP Unknown)    SpO2 98%    BMI 24.99 kg/m  Discharge exam:  Constitutional: Elderly female resting comfortably in bed, no acute distress noted. Cardio: Regular rate and rhythm.  No murmurs, rubs, gallops. Pulm: Clear to auscultation bilaterally. Normal work of breathing on room air. Abdomen: Soft, nontender, nondistended. MSK: Negative for extremity edema. Distal pulses normal. Skin: Skin is warm and dry. Neuro: Alert and oriented x3.  No focal deficit noted. Psych: Normal mood and affect.  Pertinent Labs, Studies, and Procedures:  CT HEAD WO CONTRAST  Result Date: 02/03/2021 CLINICAL DATA:  Altered mental status EXAM: CT HEAD WITHOUT CONTRAST TECHNIQUE: Contiguous axial images  were obtained from the base of the skull through the vertex without intravenous contrast. COMPARISON:  01/28/2021 FINDINGS: Brain: No evidence of acute infarction, hemorrhage, hydrocephalus, extra-axial collection or mass lesion/mass effect. Extensive periventricular and deep white matter hypodensity. Vascular: No hyperdense vessel or unexpected calcification. Skull: Normal. Negative for fracture or focal lesion. Sinuses/Orbits: No acute finding. Other: None. IMPRESSION: No acute intracranial pathology. Advanced small-vessel white matter disease. Electronically Signed   By: Delanna Ahmadi M.D.   On: 02/03/2021 09:59   MR LIVER WO CONRTAST  Result Date: 02/04/2021 CLINICAL DATA:  A female at age 11 presents for evaluation of hepatic abnormalities discovered on recent sonogram. EXAM: MRI ABDOMEN WITHOUT  CONTRAST TECHNIQUE: Multiplanar multisequence MR imaging was performed without the administration of intravenous contrast. COMPARISON:  Previous chest x-rays and abdominal sonogram. FINDINGS: Lower chest: No sign of effusion. 2.2 x 2.3 cm nodular area in the LEFT upper chest suspicious for pulmonary nodule or mass, not well assessed on MR. Cardiac enlargement. RIGHT-sided Port-A-Cath, not well assessed. No effusion or consolidation. Hepatobiliary: Signs of iron deposition in the liver. Diffusely heterogeneous appearance of the liver with serpiginous areas of variable signal on T2, not well assessed. No gross intrahepatic biliary duct distension, not well assessed. Suggestion of steatotic changes as well as iron deposition in the liver. No pericholecystic stranding. Pancreas: Limited assessment of the pancreas, markedly limited by susceptibility artifact presumably from hemostatic clips placed during recent endoscopy. No gross pancreatic ductal dilation. Spleen:  Splenic iron deposition. Adrenals/Urinary Tract: Smooth renal contours. No hydronephrosis. Normal adrenal glands. Small cyst in the interpolar LEFT kidney. Stomach/Bowel: Susceptibility artifact about the stomach obscures much of the LEFT hemi liver on many of the sequences acquired likely related to hemostatic clips placed during recent endoscopy. Similar artifact seen in the RIGHT lower quadrant potentially related to interval passage of some of these clip since previous imaging Vascular/Lymphatic: No adenopathy in the retroperitoneum. Vascular structures not well assessed given lack of intravenous contrast. No signs of aneurysmal dilation of the abdominal aorta. Other:  No ascites. Musculoskeletal: Iron deposition in marrow spaces. Heterogeneous marrow signal and many areas in some areas suggestive of vertebral hemangiomata, in other areas more compatible with degenerative endplate changes. Overall not well assessed. IMPRESSION: Signs of iron deposition  in the liver, spleen and marrow spaces. Likely due to exogenous administration 2.2 x 2.3 cm nodular area in the LEFT upper chest suspicious for pulmonary nodule or mass, not well assessed on MR. Given suspicion for pulmonary neoplasm and other findings seen on previous and current imaging would suggest CT of the chest, abdomen and pelvis, would suggest contrast for optimum assessment as allowed by renal function. Serpiginous areas of T2 hyperintensity favored to represent prominent vasculature in the liver perhaps due to underlying shunt lesions. No discrete hepatic lesion is seen on noncontrast imaging. Variable signal in the spleen raising the question of underlying splenic lesion. Suggest attention on dedicated abdominal imaging. Hemostatic clips causing substantial susceptibility artifact in the upper abdomen. Due to this and the patient's inability to hold their breath contrast was not administered. These results were called by telephone at the time of interpretation on 02/04/2021 at 5:31 pm to provider Dr. Lisabeth Devoid, Who verbally acknowledged these results. Electronically Signed   By: Zetta Bills M.D.   On: 02/04/2021 17:32   DG Chest Portable 1 View  Result Date: 02/03/2021 CLINICAL DATA:  Altered mental status, drooling LEFT-side, CHF, GERD, hypertension, type II diabetes mellitus EXAM: PORTABLE CHEST 1 VIEW COMPARISON:  Portable exam 1046 hours compared to 01/28/2021 FINDINGS: RIGHT jugular Port-A-Cath with tip projecting over SVC near cavoatrial junction. Enlargement of cardiac silhouette. Mediastinal contours and pulmonary vascularity normal. Atherosclerotic calcification aorta. Vague opacity LEFT upper lobe question infiltrate. Remaining lungs clear. No pleural effusion or pneumothorax. Bones demineralized. IMPRESSION: Enlargement of cardiac silhouette with questionable LEFT upper lobe infiltrate. Aortic Atherosclerosis (ICD10-I70.0). Electronically Signed   By: Lavonia Dana M.D.   On: 02/03/2021  11:14   US Abdomen Limited RUQ (LIVER/GB)  Result Date: 02/04/2021 CLINICAL DATA:  In a 81 year old female presents with abnormal liver function and elevated ammonia levels. EXAM: ULTRASOUND ABDOMEN LIMITED RIGHT UPPER QUADRANT COMPARISON:  Only renal sonogram from 2016 is available for comparison. CT of the abdomen and pelvis, remote evaluation from 2006 also noted. FINDINGS: Gallbladder: Small amount of sludge in the dependent gallbladder. No wall thickening or pericholecystic fluid. No reported tenderness over the gallbladder. Common bile duct: Diameter: 2.9 mm Liver: Echogenic liver. Area of more pronounced echogenicity in the RIGHT hepatic lobe (image 59. Central serpiginous anechoic structures on grayscale imaging some of which do not appear to fill with color Doppler some of which do fill more centrally, extending to the periphery these dilated structures are noted also seen on image 59 of 71 total images. Portal vein is patent on color Doppler imaging with normal direction of blood flow towards the liver. Other: No ascites IMPRESSION: Echogenic liver suggest the a ptosis or background liver disease. Question of focal lesion in the RIGHT hepatic lobe, associated with serpiginous structures some of which fill with color Doppler imaging. In a patient with history of for a hereditary telangiectasia could consider a vascular anomaly. Dilated biliary tree is also considered. Contrasted imaging with either MRI or CT or even noncontrast imaging may be helpful for further evaluation particularly given the history of potential liver dysfunction. Focal area of abnormality suggested in the RIGHT hepatic lobe for which further evaluation with cross-sectional imaging may be helpful. These results will be called to the ordering clinician or representative by the Radiologist Assistant, and communication documented in the PACS or [employer]. Electronically Signed   By: Zetta Bills M.D.   On: 02/04/2021 11:28      CBC    Component Value Date/Time   WBC 5.3 02/12/2021 0500   RBC 2.66 (L) 02/12/2021 0500   HGB 8.2 (L) 02/12/2021 0500   HGB 12.1 01/17/2021 1450   HGB 9.7 (L) 03/13/2015 1059   HCT 25.2 (L) 02/12/2021 0500   HCT 29.5 (L) 03/13/2015 1059   PLT 139 (L) 02/12/2021 0500   PLT 142 (L) 01/17/2021 1450   PLT 140 (L) 03/13/2015 1059   PLT 237 11/09/2014 1547   MCV 94.7 02/12/2021 0500   MCV 85.5 03/13/2015 1059   MCH 30.8 02/12/2021 0500   MCHC 32.5 02/12/2021 0500   RDW 14.8 02/12/2021 0500   RDW 21.7 (H) 03/13/2015 1059   LYMPHSABS 0.7 02/03/2021 1043   LYMPHSABS 0.4 (L) 03/13/2015 1059   MONOABS 0.6 02/03/2021 1043   MONOABS 0.3 03/13/2015 1059   EOSABS 0.3 02/03/2021 1043   EOSABS 0.3 03/13/2015 1059   BASOSABS 0.0 02/03/2021 1043   BASOSABS 0.0 03/13/2015 1059   CMP Latest Ref Rng & Units 02/12/2021 02/11/2021 02/10/2021  Glucose 70 - 99 mg/dL 106(H) 212(H) 115(H)  BUN 8 - 23 mg/dL 6(L) 7(L) 6(L)  Creatinine 0.44 - 1.00 mg/dL 1.18(H) 1.24(H) 1.14(H)  Sodium 135 - 145 mmol/L 140 139 142  Potassium 3.5 -  5.1 mmol/L 5.5(H) 4.8 4.6  Chloride 98 - 111 mmol/L 111 111 111  CO2 22 - 32 mmol/L 25 24 25   Calcium 8.9 - 10.3 mg/dL 8.3(L) 8.1(L) 8.3(L)  Total Protein 6.5 - 8.1 g/dL 4.1(L) 3.9(L) 4.0(L)  Total Bilirubin 0.3 - 1.2 mg/dL 0.3 0.6 1.0  Alkaline Phos 38 - 126 U/L 85 77 76  AST 15 - 41 U/L 26 25 26   ALT 0 - 44 U/L 15 15 16    CBG (last 3)  Recent Labs    02/11/21 2204 02/12/21 0612 02/12/21 1112  GLUCAP 136* 108* 122*    Discharge Instructions: Discharge Instructions     Call MD for:  difficulty breathing, headache or visual disturbances   Complete by: As directed    Call MD for:  persistant dizziness or light-headedness   Complete by: As directed    Call MD for:  persistant nausea and vomiting   Complete by: As directed    No wound care   Complete by: As directed        Signed: Orvis Brill, MD 02/12/2021, 1:49 PM   Pager: [phone]

## 2021-02-11 NOTE — TOC Progression Note (Signed)
Transition of Care [facility]) - Progression Note    Patient Details  Name: [name] MRN: [id number] Date of Birth: 08-13-1940  Transition of Care [facility]) CM/SW [place], [place] Phone Number: 02/11/2021, 10:24 AM  Clinical Narrative:     [facility] confirmed bed offer/availability   Insurance authorization started- reference # [id number] -auth remains pending   Thurmond Butts, MSW, LCSW Clinical Social Worker    Expected Discharge Plan: Skilled Nursing Facility Barriers to Discharge: Ship broker, Continued Medical Work up, SNF Pending bed offer  Expected Discharge Plan and Services Expected Discharge Plan: [place]   Discharge Planning Services: CM Consult   Living arrangements for the past 2 months: Skilled Nursing Facility                                       Social Determinants of Health (SDOH) Interventions    Readmission Risk Interventions No flowsheet data found.

## 2021-02-12 DIAGNOSIS — K922 Gastrointestinal hemorrhage, unspecified: Secondary | ICD-10-CM | POA: Diagnosis not present

## 2021-02-12 DIAGNOSIS — R410 Disorientation, unspecified: Secondary | ICD-10-CM | POA: Diagnosis not present

## 2021-02-12 LAB — CBC
HCT: 25.2 % — ABNORMAL LOW (ref 36.0–46.0)
Hemoglobin: 8.2 g/dL — ABNORMAL LOW (ref 12.0–15.0)
MCH: 30.8 pg (ref 26.0–34.0)
MCHC: 32.5 g/dL (ref 30.0–36.0)
MCV: 94.7 fL (ref 80.0–100.0)
Platelets: 139 10*3/uL — ABNORMAL LOW (ref 150–400)
RBC: 2.66 MIL/uL — ABNORMAL LOW (ref 3.87–5.11)
RDW: 14.8 % (ref 11.5–15.5)
WBC: 5.3 10*3/uL (ref 4.0–10.5)
nRBC: 0 % (ref 0.0–0.2)

## 2021-02-12 LAB — COMPREHENSIVE METABOLIC PANEL
ALT: 15 U/L (ref 0–44)
AST: 26 U/L (ref 15–41)
Albumin: 2 g/dL — ABNORMAL LOW (ref 3.5–5.0)
Alkaline Phosphatase: 85 U/L (ref 38–126)
Anion gap: 4 — ABNORMAL LOW (ref 5–15)
BUN: 6 mg/dL — ABNORMAL LOW (ref 8–23)
CO2: 25 mmol/L (ref 22–32)
Calcium: 8.3 mg/dL — ABNORMAL LOW (ref 8.9–10.3)
Chloride: 111 mmol/L (ref 98–111)
Creatinine, Ser: 1.18 mg/dL — ABNORMAL HIGH (ref 0.44–1.00)
GFR, Estimated: 47 mL/min — ABNORMAL LOW (ref >60)
Glucose, Bld: 106 mg/dL — ABNORMAL HIGH (ref 70–99)
Potassium: 5.5 mmol/L — ABNORMAL HIGH (ref 3.5–5.1)
Sodium: 140 mmol/L (ref 135–145)
Total Bilirubin: 0.3 mg/dL (ref 0.3–1.2)
Total Protein: 4.1 g/dL — ABNORMAL LOW (ref 6.5–8.1)

## 2021-02-12 LAB — PATHOLOGIST SMEAR REVIEW

## 2021-02-12 LAB — GLUCOSE, CAPILLARY
Glucose-Capillary: 108 mg/dL — ABNORMAL HIGH (ref 70–99)
Glucose-Capillary: 122 mg/dL — ABNORMAL HIGH (ref 70–99)

## 2021-02-12 MED ORDER — TRULICITY 1.5 MG/0.5ML ~~LOC~~ SOAJ: 1.5000 mg | SUBCUTANEOUS | Status: DC

## 2021-02-12 MED ORDER — LEVETIRACETAM 500 MG PO TABS: 500.0000 mg | ORAL_TABLET | Freq: Two times a day (BID) | ORAL | Status: DC

## 2021-02-12 MED ORDER — HEPARIN SOD (PORK) LOCK FLUSH 100 UNIT/ML IV SOLN
500.0000 [IU] | INTRAVENOUS | Status: AC | PRN
  Administered 2021-02-12: 500 [IU]
  Filled 2021-02-12: qty 5

## 2021-02-12 MED ORDER — SODIUM ZIRCONIUM CYCLOSILICATE 5 G PO PACK
5.0000 g | PACK | Freq: Once | ORAL | Status: DC
  Filled 2021-02-12: qty 1

## 2021-02-12 MED ORDER — INSULIN LISPRO (1 UNIT DIAL) 100 UNIT/ML (KWIKPEN): 2.0000 [IU] | PEN_INJECTOR | Freq: Three times a day (TID) | SUBCUTANEOUS | Status: DC

## 2021-02-12 MED ORDER — INSULIN LISPRO PROT & LISPRO (75-25 MIX) 100 UNIT/ML KWIKPEN: 12.0000 [IU] | PEN_INJECTOR | SUBCUTANEOUS | Status: DC

## 2021-02-12 MED ORDER — GABAPENTIN 100 MG PO CAPS: 100.0000 mg | ORAL_CAPSULE | Freq: Two times a day (BID) | ORAL | Status: DC

## 2021-02-12 NOTE — Progress Notes (Shared)
HD#8 SUBJECTIVE:  Patient Summary: [name] is a 81 y.o. with a pertinent PMH of hereditary hemorrhagic telangiectasias, recent hospitalization with severe GI bleed from a gastric ulcer status post clipping, history of epilepsy, insulin-dependent type 2 diabetes mellitus, CKD stage III, hypertension, hyperlipidemia, dementia, admitted for altered mental status.   Overnight Events: None  Interim History: Patient evaluated at bedside this AM. She continues to endorse feeling well.  Denies any further bleeding episodes. Denies dizziness, chest pain, abdominal pain.  She understands that at this time her discharge timeline is dependent on when she is able to get a bed at a rehab facility.   OBJECTIVE:  Vital Signs: Vitals:   02/11/21 1951 02/11/21 2319 02/12/21 0106 02/12/21 0357  BP: 111/84 (!) 140/51 (!) 139/37 (!) 123/51  Pulse: (!) 55 (!) 52 (!) 53 (!) 55  Resp: 16 20 13 15   Temp: (!) 97.4 F (36.3 C) 97.9 F (36.6 C) 97.9 F (36.6 C) (!) 97.5 F (36.4 C)  TempSrc: Oral Oral Oral Oral  SpO2: 100% 99% 100% 96%  Weight:      Height:       Supplemental O2: Room Air SpO2: 96 % O2 Flow Rate (L/min): 2 L/min  Filed Weights   02/03/21 0935 02/04/21 2034 02/06/21 0820  Weight: 59 kg 64 kg 64 kg     Intake/Output Summary (Last 24 hours) at 02/12/2021 0724 Last data filed at 02/12/2021 0359 Gross per 24 hour  Intake 730 ml  Output 800 ml  Net -70 ml    Net IO Since Admission: -1,288.57 mL [02/12/21 0724]  Physical Exam: Constitutional: Elderly female resting comfortably in bed, no acute distress noted. Cardio: Regular rate and rhythm.  No murmurs, rubs, gallops. Pulm: Clear to auscultation bilaterally. Normal work of breathing on room air. Abdomen: Soft, nontender, nondistended. MSK: Negative for extremity edema. Distal pulses normal. Skin: Skin is warm and dry. Neuro: Alert and oriented x3.  No focal deficit noted. Psych: Normal mood and affect.  Patient  Lines/Drains/Airways Status     Active Line/Drains/Airways     Name Placement date Placement time Site Days   Implanted Port 04/13/18 Right Chest 04/13/18  0855  Chest  1033   Implanted Port Right Chest --  --  Chest  --   Peripheral IV 02/03/21 22 G Right Hand 02/03/21  1026  Hand  6   Peripheral IV 02/05/21 22 G Anterior;Right Wrist 02/05/21  0835  Wrist  4   Peripheral IV 02/05/21 22 G 1" Left;Posterior;Lateral;Upper Forearm 02/05/21  1418  Forearm  4   External Urinary Catheter 01/18/21  0722  --  22   External Urinary Catheter 01/28/21  1940  --  12   External Urinary Catheter 02/03/21  1309  --  6   Incision (Closed) 02/05/21 Groin Right 02/05/21  1013  -- 4             ASSESSMENT/PLAN:  Assessment: Principal Problem:   AMS (altered mental status) Active Problems:   GI bleed   Altered mental status  [name] is a 81 y.o. with a pertinent PMH of hereditary hemorrhagic telangiectasias, recent hospitalization with severe GI bleed from a gastric ulcer status post clipping, history of epilepsy, insulin-dependent type 2 diabetes mellitus, CKD stage III, hypertension, hyperlipidemia, dementia, admitted for altered mental status.    ON: chest pain episodes, resolved with BM, sinus brady and arrhythmia CBC: Hgb 8.2,  K 5.5 up from 4.8 - held klor con (  think she is on it for her torsemide) Cr 1.18, stable   Plan: DC today to SNF? 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 She is currently receiving Protonix, Zofran.  -Monitor closely for any signs of recurrent bleeding.   -Trend CBC -Of note, given evidence of iron deposition in liver, spleen, and marrow spaces, we  will need to be judicious with blood transfusions.  -Transfuse if Hgb <7 and hemodynamically unstable   #Altered mental status, resolved #Reported history of dementia Patient continues to be alert and oriented x3 with improvement of AKI and resolution of acute on chronic GI bleed. -Continue to monitor.   #AKI on CKD stage IIIa AKI continues to progress toward resolution, with serum Cr around baseline at this time. -Avoid nephrotoxic agents -Trend renal function   #Hyperammonemia Ammonia level on admission was elevated to 188, normal LFTs. Imaging to this point has noted signs of iron deposition in the liver, spleen, and marrow spaces. Follow-up CT of the abdomen showed hepatic steatosis. It is possible that her acute GI bleed contributed to elevated ammonia levels. -Continue to monitor.  If recurrence of altered mental status, consider checking additional ammonia level and giving lactulose.  #History of epilepsy Continue home Keppra.   #Type 2 diabetes mellitus with insulin dependence #Diabetic neuropathy Blood glucose measurements have been very labile over the last several days. Most recent CBG 195. -Diabetes coordinator following, appreciate her assistance.             -Continue Levemir 8 units daily -NovoLog sliding scale insulin every 8 hours -QHS correction -CBG monitoring before meals and nightly and if concerns of hypoglycemia   #HFpEF Most recent echo in August 2022 showed EF of 65 to 91%, grade 2 diastolic dysfunction and severe left atrial enlargement. She is also been noted to have a pericardial effusion which is concerning for cardiac involvement no hemochromatosis as well.  Patient has home torsemide 20 mg daily as needed. She is clinically euvolemic on exam. -Continue holding home torsemide 20 mg daily   #Hypertension Normotensive at this time. We are holding home amlodipine 2.5 mg daily. -Continue holding home amlodipine 2.5 mg daily   #Left upper lobe  consolidation CT chest was remarkable for rounded left upper lobe consolidation favoring focal pneumonia over neoplasm with recommendation of follow-up imaging in 6 to 8 weeks to assess for resolution of focal pneumonia versus rule out neoplasm. Patient was previously on IV ceftriaxone 2 g daily for concern for lobar pneumonia, most recently s/p 3 days of cefdinir.             - Will need repeat imaging in 4-6 weeks. 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  CT also showed signs of iron deposition in the spleen. -Incidental finding, follow-up as outpatient  Best Practice: Diet: Regular diet VTE: Place and maintain sequential compression device Start: 02/07/21 0653 Code: Full AB: None Therapy Recs: SNF, DME: none DISPO: Medically stable for discharge, anticipated discharge to Skilled nursing facility pending placement.  Signature: Mitzie Na, M.D.  Internal Medicine Resident, PGY-1 [name]  Pager: [phone] 7:24 AM, 02/12/2021   Please contact the on call pager after 5 pm and on weekends at [phone].

## 2021-02-12 NOTE — Discharge Instructions (Addendum)
You were seen in the hospital for altered mental status and a bleed in your gastrointestinal tract.  You have had surgeries while you are here, and your blood counts have improved!  Here are some instructions:  1) Please follow-up with your primary care provider over the next several days to week.  If any symptoms change or worsen acutely, please return to the nearest emergency department.   2) We would like you to hold your torsemide and amlodipine due to you having some lower blood pressures while you were here.  Please also hold your potassium, because your potassium levels were a Staib bit high today.  We think this is because we have not given you the torsemide, which can affect your potassium levels.  He will restart these at your outpatient visit under the discretion of your primary care provider.

## 2021-02-12 NOTE — Progress Notes (Signed)
Pt was discharged from 4E to SNF: [facility] via [place]. IV team was consulted to deaccess pts R subclavian port. Telemetry was removed. Discharge information was provided to transport. Attempted to call report to receiving nurse. On hold for 13 minutes.  Raelyn Number, RN

## 2021-02-12 NOTE — Progress Notes (Incomplete)
Pt discharged to SNF [facility] via [facility].

## 2021-02-12 NOTE — Progress Notes (Signed)
Pt had a complaint of immediate left chest and referred left shoulder pain scale 5/10. EKG 12 leads showed sinus bradycardia and sinus arrhythmia. HR 50s, BP stable. Denied SOB Tylenol given. On-call provider made aware. No new order received. Her pain was subsided after she had bowel movement.  No acute distress. We will continue to monitor.  Kennyth Lose, RN

## 2021-02-12 NOTE — TOC Transition Note (Signed)
Transition of Care [facility]) - CM/SW Discharge Note   Patient Details  Name: [name] MRN: [id number] Date of Birth: 22-Dec-1940  [facility] CM/SW Contact:  Vinie Sill, LCSW Phone Number: 02/12/2021, 1:24 PM   Clinical Narrative:     Patient will Discharge to: [facility] Discharge Date: 02/12/2021 Family Notified: Spouse and daughter Transport IW:LNLG  Per MD patient is ready for discharge. RN, patient, and facility notified of discharge. Discharge Summary sent to facility. RN given number for [phone]. Ambulance transport requested for patient.   Clinical Social Worker signing off.  Thurmond Butts, MSW, LCSW Clinical Social Worker     Final next level of care: Skilled Nursing Facility Barriers to Discharge: Barriers Resolved   Patient Goals and CMS Choice Patient states their goals for this hospitalization and ongoing recovery are:: To return to [place] or another option CMS Medicare.gov Compare Post Acute Care list provided to:: Other (Comment Required) Choice offered to / list presented to : Adult Children  Discharge Placement              Patient chooses bed at:  ([facility]) Patient to be transferred to facility by: [facility] Name of family member notified: Spouse Patient and family notified of of transfer: 02/12/21  Discharge Plan and Services   Discharge Planning Services: CM Consult                                 Social Determinants of Health ([place]) Interventions     Readmission Risk Interventions No flowsheet data found.

## 2021-02-19 ENCOUNTER — Ambulatory Visit: Payer: Medicare Other

## 2021-02-19 ENCOUNTER — Other Ambulatory Visit: Payer: Medicare Other

## 2021-02-19 ENCOUNTER — Ambulatory Visit: Payer: Medicare Other | Admitting: Hematology

## 2021-02-19 ENCOUNTER — Inpatient Hospital Stay: Payer: Medicare Other | Attending: Hematology

## 2021-02-19 ENCOUNTER — Other Ambulatory Visit: Payer: Self-pay

## 2021-02-19 ENCOUNTER — Inpatient Hospital Stay (HOSPITAL_BASED_OUTPATIENT_CLINIC_OR_DEPARTMENT_OTHER): Payer: Medicare Other | Admitting: Hematology

## 2021-02-19 ENCOUNTER — Encounter: Payer: Self-pay | Admitting: Hematology

## 2021-02-19 ENCOUNTER — Inpatient Hospital Stay: Payer: Medicare Other

## 2021-02-19 VITALS — BP 160/62 | HR 56 | Temp 98.0°F | Resp 18 | Ht 63.0 in | Wt 138.5 lb

## 2021-02-19 DIAGNOSIS — D500 Iron deficiency anemia secondary to blood loss (chronic): Secondary | ICD-10-CM | POA: Insufficient documentation

## 2021-02-19 DIAGNOSIS — I780 Hereditary hemorrhagic telangiectasia: Secondary | ICD-10-CM | POA: Insufficient documentation

## 2021-02-19 DIAGNOSIS — K31819 Angiodysplasia of stomach and duodenum without bleeding: Secondary | ICD-10-CM

## 2021-02-19 DIAGNOSIS — Z95828 Presence of other vascular implants and grafts: Secondary | ICD-10-CM

## 2021-02-19 DIAGNOSIS — I5032 Chronic diastolic (congestive) heart failure: Secondary | ICD-10-CM | POA: Insufficient documentation

## 2021-02-19 DIAGNOSIS — E1122 Type 2 diabetes mellitus with diabetic chronic kidney disease: Secondary | ICD-10-CM | POA: Diagnosis not present

## 2021-02-19 DIAGNOSIS — N183 Chronic kidney disease, stage 3 unspecified: Secondary | ICD-10-CM | POA: Diagnosis not present

## 2021-02-19 DIAGNOSIS — F0390 Unspecified dementia without behavioral disturbance: Secondary | ICD-10-CM | POA: Insufficient documentation

## 2021-02-19 DIAGNOSIS — I130 Hypertensive heart and chronic kidney disease with heart failure and stage 1 through stage 4 chronic kidney disease, or unspecified chronic kidney disease: Secondary | ICD-10-CM | POA: Insufficient documentation

## 2021-02-19 LAB — CMP (CANCER CENTER ONLY)
ALT: 11 U/L (ref 0–44)
AST: 25 U/L (ref 15–41)
Albumin: 2.7 g/dL — ABNORMAL LOW (ref 3.5–5.0)
Alkaline Phosphatase: 86 U/L (ref 38–126)
Anion gap: 4 — ABNORMAL LOW (ref 5–15)
BUN: 9 mg/dL (ref 8–23)
CO2: 28 mmol/L (ref 22–32)
Calcium: 8.7 mg/dL — ABNORMAL LOW (ref 8.9–10.3)
Chloride: 113 mmol/L — ABNORMAL HIGH (ref 98–111)
Creatinine: 1.13 mg/dL — ABNORMAL HIGH (ref 0.44–1.00)
GFR, Estimated: 49 mL/min — ABNORMAL LOW (ref >60)
Glucose, Bld: 131 mg/dL — ABNORMAL HIGH (ref 70–99)
Potassium: 4.1 mmol/L (ref 3.5–5.1)
Sodium: 145 mmol/L (ref 135–145)
Total Bilirubin: 0.5 mg/dL (ref 0.3–1.2)
Total Protein: 5.1 g/dL — ABNORMAL LOW (ref 6.5–8.1)

## 2021-02-19 LAB — CBC WITH DIFFERENTIAL (CANCER CENTER ONLY)
Abs Immature Granulocytes: 0.01 10*3/uL (ref 0.00–0.07)
Basophils Absolute: 0 10*3/uL (ref 0.0–0.1)
Basophils Relative: 1 %
Eosinophils Absolute: 0.3 10*3/uL (ref 0.0–0.5)
Eosinophils Relative: 7 %
HCT: 28.8 % — ABNORMAL LOW (ref 36.0–46.0)
Hemoglobin: 9.1 g/dL — ABNORMAL LOW (ref 12.0–15.0)
Immature Granulocytes: 0 %
Lymphocytes Relative: 15 %
Lymphs Abs: 0.7 10*3/uL (ref 0.7–4.0)
MCH: 29.4 pg (ref 26.0–34.0)
MCHC: 31.6 g/dL (ref 30.0–36.0)
MCV: 93.2 fL (ref 80.0–100.0)
Monocytes Absolute: 0.4 10*3/uL (ref 0.1–1.0)
Monocytes Relative: 8 %
Neutro Abs: 3.3 10*3/uL (ref 1.7–7.7)
Neutrophils Relative %: 69 %
Platelet Count: 199 10*3/uL (ref 150–400)
RBC: 3.09 MIL/uL — ABNORMAL LOW (ref 3.87–5.11)
RDW: 14.8 % (ref 11.5–15.5)
WBC Count: 4.7 10*3/uL (ref 4.0–10.5)
nRBC: 0 % (ref 0.0–0.2)

## 2021-02-19 LAB — FERRITIN: Ferritin: 23 ng/mL (ref 11–307)

## 2021-02-19 MED ORDER — SODIUM CHLORIDE 0.9% FLUSH
10.0000 mL | INTRAVENOUS | Status: DC | PRN
  Administered 2021-02-19: 10 mL

## 2021-02-19 MED ORDER — HEPARIN SOD (PORK) LOCK FLUSH 100 UNIT/ML IV SOLN
500.0000 [IU] | Freq: Once | INTRAVENOUS | Status: AC | PRN
  Administered 2021-02-19: 500 [IU]

## 2021-02-19 NOTE — Progress Notes (Signed)
[place]   [phone] [phone]   Clinic Follow up Note   Patient Care Team: Mike Craze, DO as PCP - General (Internal Medicine) [name], MD as PCP - Cardiology (Cardiology) Truitt Merle, MD as Consulting Physician (Hematology) Elayne Snare, MD as Consulting Physician (Endocrinology)  Date of Service:  02/19/2021  CHIEF COMPLAINT: f/u of anemia due to chronic blood loss, HHT  CURRENT THERAPY:  1. Iv feraheme as needed if ferritin<100, latest on 11/04/2020 2. Avastin 5mg /kg every 2 weeks, starting 05/09/18. Reduced to every 4 weeks starting 11/07/18. Reduced to every 2 months starting 01/02/19. Held 03/09/19-05/26/19 due to high proteinuria. Give Avastin every 6 weeks now  3. Blood transfusion if Hg<8.0  ASSESSMENT & PLAN:  [name] is a 81 y.o. female with   1. Hereditary hemorrhagic telangiectasia (HHT), with epistaxis and frequent GI bleeding -She receives blood transfusion as needed, but has not needed since 04/2018 when she started Beva. -She is on Avastin intermittently/PRN due to high proteinuria. -anemia worsened off Avastin. She was given her most recent dose on 01/17/21. -given her recent hospitalization(s) and severe GI bleeding, we will hold beva today. -plan to restart in 6 weeks if no more GI bleeding    2. Iron deficiency anemia secondary to #1 -She receives IV Feraheme as needed for Ferritin <100, most recently 11/04/20 -hgb down to 9.1 today (02/19/21) due to recent bleeding.   3. Dementia  -She takes Aricept and memantine. On Keppra for seizures.  -f/u with neurology.    5. DM, CHF, CKD stage III and HTN secondary to Avastin - followed by Dr. Dwyane Dee -f/u with PCP   6. Proteinuria -This is likely secondary to avastin, but she also has diabetes  -She receives bevacizumab as needed   7. Iron overload?  -her recent liver MRI on 02/04/2021 showed signs of iron deposits in liver, spleen and marrow space. -Her  ferritin is 23 today, she does not need iron chelating -will hold iv feraheme unless ferritin<10    Plan -I have reviewed her recent hospital admission course  -cancel Bevacizumab today -I will request labs to be done every 2 weeks at the SNF where she is currently staying. -lab, f/u, and beva in 6 weeks    No problem-specific Assessment & Plan notes found for this encounter.   INTERVAL HISTORY:  [name] is here for a follow up of anemia and HHT. She was last seen by me on 11/27/20. She presents to the clinic accompanied by her husband. When asked how she's doing, she states she wants to go home.    All other systems were reviewed with the patient and are negative.  MEDICAL HISTORY:  Past Medical History:  Diagnosis Date   Chronic anemia    Chronic diastolic CHF (congestive heart failure) ([place]) 10/03/2013   Chronic GI bleeding    /notes 11/29/2014   Family history of anesthesia complication    "niece has a hard time coming out" (09/15/2012)   Frequent nosebleeds    chronic   Gastric AV malformation    /notes 11/29/2014   GERD (gastroesophageal reflux disease)    Heart murmur 04/01/2017   Moderate AVSC on echo 09/2016   History of blood transfusion "several"   History of epistaxis    HTN (hypertension), benign 03/02/2012   Hyperlipidemia    Iron deficiency anemia    chronic infusions"   Lichen planus    Both lower extremities   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    SURGICAL HISTORY: Past Surgical History:  Procedure Laterality Date   CATARACT EXTRACTION     "I think it was just one eye"   ESOPHAGOGASTRODUODENOSCOPY  02/26/2011   Procedure:  ESOPHAGOGASTRODUODENOSCOPY (EGD);  Surgeon: [name], MD;  Location: [name];  Service: Endoscopy;  Laterality: N/A;   ESOPHAGOGASTRODUODENOSCOPY N/A 11/08/2012   Procedure: ESOPHAGOGASTRODUODENOSCOPY (EGD);  Surgeon: [name], MD;  Location: [name];  Service: Endoscopy;  Laterality: N/A;   ESOPHAGOGASTRODUODENOSCOPY N/A 10/04/2013   Procedure: ESOPHAGOGASTRODUODENOSCOPY (EGD);  Surgeon: Winfield Cunas., MD;  Location: [name];  Service: Endoscopy;  Laterality: N/A;  with APC on stand-by   ESOPHAGOGASTRODUODENOSCOPY N/A 07/06/2014   Procedure: ESOPHAGOGASTRODUODENOSCOPY (EGD);  Surgeon: Clarene Essex, MD;  Location: [name];  Service: Endoscopy;  Laterality: N/A;   ESOPHAGOGASTRODUODENOSCOPY N/A 09/05/2014   Procedure: ESOPHAGOGASTRODUODENOSCOPY (EGD);  Surgeon: Laurence Spates, MD;  Location: [name];  Service: Endoscopy;  Laterality: N/A;  APC on standby to control bleeding   ESOPHAGOGASTRODUODENOSCOPY N/A 11/29/2014   Procedure: ESOPHAGOGASTRODUODENOSCOPY (EGD);  Surgeon: [name], MD;  Location[facility] ENDOSCOPY;  Service: Endoscopy;  Laterality: N/A;   ESOPHAGOGASTRODUODENOSCOPY N/A 09/28/2015   Procedure: ESOPHAGOGASTRODUODENOSCOPY (EGD);  Surgeon: Clarene Essex, MD;  Location: [facility] ENDOSCOPY;  Service: Endoscopy;  Laterality: N/A;   ESOPHAGOGASTRODUODENOSCOPY (EGD) WITH PROPOFOL N/A 12/04/2017   Procedure: ESOPHAGOGASTRODUODENOSCOPY (EGD) WITH PROPOFOL;  Surgeon: [name], MD;  Location: [place];  Service: Endoscopy;  Laterality: N/A;   ESOPHAGOGASTRODUODENOSCOPY (EGD) WITH PROPOFOL N/A 12/24/2020   Procedure: ESOPHAGOGASTRODUODENOSCOPY (EGD) WITH PROPOFOL;  Surgeon: Arta Silence, MD;  Location: [place];  Service: Endoscopy;  Laterality: N/A;   ESOPHAGOGASTRODUODENOSCOPY (EGD) WITH PROPOFOL N/A 01/19/2021   Procedure: ESOPHAGOGASTRODUODENOSCOPY (EGD) WITH PROPOFOL;  Surgeon: [name], MD;  Location: [place];  Service:  Endoscopy;  Laterality: N/A;   ESOPHAGOGASTRODUODENOSCOPY (EGD) WITH PROPOFOL N/A 02/06/2021   Procedure: ESOPHAGOGASTRODUODENOSCOPY (EGD) WITH PROPOFOL;  Surgeon: Ronnette Juniper, MD;  Location: [place];  Service: Gastroenterology;  Laterality: N/A;   ESOPHAGOGASTRODUODENOSCOPY ENDOSCOPY  08/19/2006   with laser treatment   HEMOSTASIS CLIP PLACEMENT  12/24/2020   Procedure: HEMOSTASIS CLIP PLACEMENT;  Surgeon: Arta Silence, MD;  Location: [facility] ENDOSCOPY;  Service: Endoscopy;;   HEMOSTASIS CLIP PLACEMENT  01/19/2021   Procedure: HEMOSTASIS CLIP PLACEMENT;  Surgeon: [name], MD;  Location: [place];  Service: Endoscopy;;   HOT HEMOSTASIS N/A 07/06/2014   Procedure: HOT HEMOSTASIS (ARGON PLASMA COAGULATION/BICAP);  Surgeon: Clarene Essex, MD;  Location: [name];  Service: Endoscopy;  Laterality: N/A;   HOT HEMOSTASIS N/A 09/28/2015   Procedure: HOT HEMOSTASIS (ARGON PLASMA COAGULATION/BICAP);  Surgeon: Clarene Essex, MD;  Location: [facility] ENDOSCOPY;  Service: Endoscopy;  Laterality: N/A;   HOT HEMOSTASIS N/A 12/04/2017   Procedure: HOT HEMOSTASIS (ARGON PLASMA COAGULATION/BICAP);  Surgeon: [name], MD;  Location: [place];  Service: Endoscopy;  Laterality: N/A;   IR ANGIOGRAM SELECTIVE EACH ADDITIONAL VESSEL  02/05/2021   IR ANGIOGRAM VISCERAL SELECTIVE  02/05/2021   IR EMBO ART  VEN HEMORR LYMPH EXTRAV  INC GUIDE ROADMAPPING  02/05/2021   IR US GUIDE VASC ACCESS RIGHT  02/05/2021  NASAL HEMORRHAGE CONTROL     "for bleeding"    SAVORY DILATION  02/26/2011   Procedure: SAVORY DILATION;  Surgeon: [name], MD;  Location: [name];  Service: Endoscopy;  Laterality: N/A;  c-arm needed   SCLEROTHERAPY  12/24/2020   Procedure: SCLEROTHERAPY;  Surgeon: Arta Silence, MD;  Location: [facility] ENDOSCOPY;  Service: Endoscopy;;   SCLEROTHERAPY  01/19/2021   Procedure: Clide Deutscher;  Surgeon: [name], MD;  Location: [facility] ENDOSCOPY;  Service: Endoscopy;;   SUBMUCOSAL  INJECTION  12/04/2017   Procedure: SUBMUCOSAL INJECTION;  Surgeon: [name], MD;  Location: [facility] ENDOSCOPY;  Service: Endoscopy;;    I have reviewed the social history and family history with the patient and they are unchanged from previous note.  ALLERGIES:  is allergic to aspirin.  MEDICATIONS:  Current Outpatient Medications  Medication Sig Dispense Refill   ACCU-CHEK GUIDE test strip USE AS INSTRUCTED TO CHECK BLOOD SUGAR 3 TIMES DAILY. (Patient taking differently: 1 each by Other route in the morning, at noon, and at bedtime.) 100 each 0   Accu-Chek Softclix Lancets lancets 1 each by Other route 3 (three) times daily. Use as instructed to check blood sugar 3 times per day dx code E11.65 100 each 3   Continuous Blood Gluc Sensor (FREESTYLE LIBRE 2 SENSOR) MISC 2 Devices by Does not apply route every 14 (fourteen) days. 2 each 3   diclofenac Sodium (VOLTAREN) 1 % GEL Apply 2 g topically 4 (four) times daily. (Patient taking differently: Apply 2 g topically 4 (four) times daily as needed (pain).) 100 g 1   Dulaglutide (TRULICITY) 1.5 GX/2.1JH SOPN Inject 1.5 mg into the skin every Thursday.     gabapentin (NEURONTIN) 100 MG capsule Take 1 capsule (100 mg total) by mouth 2 (two) times daily.     insulin lispro (HUMALOG KWIKPEN) 100 UNIT/ML KwikPen Inject 2 Units into the skin 3 (three) times daily.     Insulin Lispro Prot & Lispro (HUMALOG MIX 75/25 KWIKPEN) (75-25) 100 UNIT/ML Kwikpen Inject 12 Units into the skin every morning.     levETIRAcetam (KEPPRA) 500 MG tablet Take 1 tablet (500 mg total) by mouth 2 (two) times daily.     memantine (NAMENDA) 10 MG tablet TAKE 1 TABLET BY MOUTH TWICE DAILY (Patient taking differently: Take 10 mg by mouth 2 (two) times daily.) 60 tablet 10   Misc Natural Products (IMMUNE FORMULA PO) Take 1 tablet by mouth daily.     Multiple Vitamins-Minerals (MULTI FOR HER 50+) TABS Take 1 tablet by mouth daily with breakfast.     pantoprazole (PROTONIX) 40 MG  tablet Take 1 tablet (40 mg total) by mouth 2 (two) times daily. 60 tablet 2   triamcinolone cream (KENALOG) 0.1 % Apply 1 application topically 2 (two) times daily.     triamcinolone ointment (KENALOG) 0.5 % APPLY 1 APPLICATION TOPICALLY TO RASH TWICE DAILY FOR ITCHING (Patient not taking: Reported on 02/03/2021) 30 g 10   No current facility-administered medications for this visit.   Facility-Administered Medications Ordered in Other Visits  Medication Dose Route Frequency Provider Last Rate Last Admin   sodium chloride 0.9 % injection 10 mL  10 mL Intracatheter PRN Truitt Merle, MD       sodium chloride flush (NS) 0.9 % injection 10 mL  10 mL Intracatheter PRN Truitt Merle, MD   10 mL at 08/18/19 1717    PHYSICAL EXAMINATION: ECOG PERFORMANCE STATUS: 3 - Symptomatic, >50% confined to bed  Vitals:   02/19/21  1343  BP: (!) 160/62  Pulse: (!) 56  Resp: 18  Temp: 98 F (36.7 C)  SpO2: 100%   Wt Readings from Last 3 Encounters:  02/19/21 138 lb 8 oz (62.8 kg)  02/06/21 141 lb 1.5 oz (64 kg)  01/27/21 130 lb (59 kg)     GENERAL:alert, no distress and comfortable SKIN: skin color normal, no rashes or significant lesions EYES: normal, Conjunctiva are pink and non-injected, sclera clear  NEURO: alert & oriented x 3 with fluent speech  LABORATORY DATA:  I have reviewed the data as listed CBC Latest Ref Rng & Units 02/19/2021 02/12/2021 02/11/2021  WBC 4.0 - 10.5 K/uL 4.7 5.3 4.5  Hemoglobin 12.0 - 15.0 g/dL 9.1(L) 8.2(L) 7.8(L)  Hematocrit 36.0 - 46.0 % 28.8(L) 25.2(L) 23.7(L)  Platelets 150 - 400 K/uL 199 139(L) 112(L)     CMP Latest Ref Rng & Units 02/19/2021 02/12/2021 02/11/2021  Glucose 70 - 99 mg/dL 131(H) 106(H) 212(H)  BUN 8 - 23 mg/dL 9 6(L) 7(L)  Creatinine 0.44 - 1.00 mg/dL 1.13(H) 1.18(H) 1.24(H)  Sodium 135 - 145 mmol/L 145 140 139  Potassium 3.5 - 5.1 mmol/L 4.1 5.5(H) 4.8  Chloride 98 - 111 mmol/L 113(H) 111 111  CO2 22 - 32 mmol/L 28 25 24   Calcium 8.9 - 10.3 mg/dL  8.7(L) 8.3(L) 8.1(L)  Total Protein 6.5 - 8.1 g/dL 5.1(L) 4.1(L) 3.9(L)  Total Bilirubin 0.3 - 1.2 mg/dL 0.5 0.3 0.6  Alkaline Phos 38 - 126 U/L 86 85 77  AST 15 - 41 U/L 25 26 25   ALT 0 - 44 U/L 11 15 15       RADIOGRAPHIC STUDIES: I have personally reviewed the radiological images as listed and agreed with the findings in the report. No results found.    No orders of the defined types were placed in this encounter.  All questions were answered. The patient knows to call the clinic with any problems, questions or concerns. No barriers to learning was detected. The total time spent in the appointment was 30 minutes.     Truitt Merle, MD 02/19/2021   I, Wilburn Mylar, am acting as scribe for Truitt Merle, MD.   I have reviewed the above documentation for accuracy and completeness, and I agree with the above.

## 2021-02-20 ENCOUNTER — Other Ambulatory Visit: Payer: Self-pay

## 2021-02-21 ENCOUNTER — Telehealth: Payer: Self-pay | Admitting: Hematology

## 2021-02-21 NOTE — Telephone Encounter (Signed)
Attempted to contact patient to schedule per 1/11 los, tried calling both numbers on file but neither number answered and one of the voicemail's was full and the other was not set up.

## 2021-03-02 ENCOUNTER — Other Ambulatory Visit: Payer: Self-pay

## 2021-03-02 ENCOUNTER — Observation Stay (HOSPITAL_COMMUNITY)
Admission: EM | Admit: 2021-03-02 | Discharge: 2021-03-03 | Disposition: A | Discharge status: Home / Self Care | Payer: Medicare Other | Attending: Internal Medicine | Admitting: Internal Medicine

## 2021-03-02 ENCOUNTER — Emergency Department (HOSPITAL_COMMUNITY): Payer: Medicare Other

## 2021-03-02 DIAGNOSIS — I110 Hypertensive heart disease with heart failure: Secondary | ICD-10-CM | POA: Insufficient documentation

## 2021-03-02 DIAGNOSIS — I780 Hereditary hemorrhagic telangiectasia: Secondary | ICD-10-CM | POA: Diagnosis present

## 2021-03-02 DIAGNOSIS — E119 Type 2 diabetes mellitus without complications: Secondary | ICD-10-CM

## 2021-03-02 DIAGNOSIS — Z79899 Other long term (current) drug therapy: Secondary | ICD-10-CM | POA: Insufficient documentation

## 2021-03-02 DIAGNOSIS — R251 Tremor, unspecified: Secondary | ICD-10-CM | POA: Diagnosis present

## 2021-03-02 DIAGNOSIS — Z20822 Contact with and (suspected) exposure to covid-19: Secondary | ICD-10-CM | POA: Diagnosis not present

## 2021-03-02 DIAGNOSIS — I10 Essential (primary) hypertension: Secondary | ICD-10-CM | POA: Diagnosis present

## 2021-03-02 DIAGNOSIS — Z794 Long term (current) use of insulin: Secondary | ICD-10-CM | POA: Diagnosis not present

## 2021-03-02 DIAGNOSIS — I480 Paroxysmal atrial fibrillation: Secondary | ICD-10-CM | POA: Diagnosis present

## 2021-03-02 DIAGNOSIS — I509 Heart failure, unspecified: Secondary | ICD-10-CM | POA: Diagnosis not present

## 2021-03-02 DIAGNOSIS — I5032 Chronic diastolic (congestive) heart failure: Secondary | ICD-10-CM | POA: Diagnosis present

## 2021-03-02 DIAGNOSIS — N179 Acute kidney failure, unspecified: Secondary | ICD-10-CM | POA: Diagnosis present

## 2021-03-02 DIAGNOSIS — E7220 Disorder of urea cycle metabolism, unspecified: Secondary | ICD-10-CM | POA: Diagnosis not present

## 2021-03-02 DIAGNOSIS — R4182 Altered mental status, unspecified: Secondary | ICD-10-CM | POA: Diagnosis present

## 2021-03-02 LAB — CBC WITH DIFFERENTIAL/PLATELET
Abs Immature Granulocytes: 0.01 10*3/uL (ref 0.00–0.07)
Basophils Absolute: 0 10*3/uL (ref 0.0–0.1)
Basophils Relative: 1 %
Eosinophils Absolute: 0.4 10*3/uL (ref 0.0–0.5)
Eosinophils Relative: 7 %
HCT: 34.8 % — ABNORMAL LOW (ref 36.0–46.0)
Hemoglobin: 11.2 g/dL — ABNORMAL LOW (ref 12.0–15.0)
Immature Granulocytes: 0 %
Lymphocytes Relative: 14 %
Lymphs Abs: 0.8 10*3/uL (ref 0.7–4.0)
MCH: 29.5 pg (ref 26.0–34.0)
MCHC: 32.2 g/dL (ref 30.0–36.0)
MCV: 91.6 fL (ref 80.0–100.0)
Monocytes Absolute: 0.7 10*3/uL (ref 0.1–1.0)
Monocytes Relative: 12 %
Neutro Abs: 4 10*3/uL (ref 1.7–7.7)
Neutrophils Relative %: 66 %
Platelets: 139 10*3/uL — ABNORMAL LOW (ref 150–400)
RBC: 3.8 MIL/uL — ABNORMAL LOW (ref 3.87–5.11)
RDW: 14.5 % (ref 11.5–15.5)
WBC: 5.9 10*3/uL (ref 4.0–10.5)
nRBC: 0 % (ref 0.0–0.2)

## 2021-03-02 LAB — COMPREHENSIVE METABOLIC PANEL
ALT: 14 U/L (ref 0–44)
AST: 30 U/L (ref 15–41)
Albumin: 2.8 g/dL — ABNORMAL LOW (ref 3.5–5.0)
Alkaline Phosphatase: 100 U/L (ref 38–126)
Anion gap: 13 (ref 5–15)
BUN: 9 mg/dL (ref 8–23)
CO2: 22 mmol/L (ref 22–32)
Calcium: 9.1 mg/dL (ref 8.9–10.3)
Chloride: 108 mmol/L (ref 98–111)
Creatinine, Ser: 1.35 mg/dL — ABNORMAL HIGH (ref 0.44–1.00)
GFR, Estimated: 40 mL/min — ABNORMAL LOW (ref >60)
Glucose, Bld: 252 mg/dL — ABNORMAL HIGH (ref 70–99)
Potassium: 3.6 mmol/L (ref 3.5–5.1)
Sodium: 143 mmol/L (ref 135–145)
Total Bilirubin: 1 mg/dL (ref 0.3–1.2)
Total Protein: 5.7 g/dL — ABNORMAL LOW (ref 6.5–8.1)

## 2021-03-02 LAB — AMMONIA: Ammonia: 109 umol/L — ABNORMAL HIGH (ref 9–35)

## 2021-03-02 MED ORDER — LACTULOSE 10 GM/15ML PO SOLN
30.0000 g | Freq: Once | ORAL | Status: AC
  Administered 2021-03-03: 30 g via ORAL
  Filled 2021-03-02: qty 60

## 2021-03-02 MED ORDER — SODIUM CHLORIDE 0.9% FLUSH: 10.0000 mL | INTRAVENOUS | Status: DC | PRN

## 2021-03-02 MED ORDER — SODIUM CHLORIDE 0.9 % IV BOLUS
500.0000 mL | Freq: Once | INTRAVENOUS | Status: AC
  Administered 2021-03-03: 500 mL via INTRAVENOUS

## 2021-03-02 MED ORDER — CHLORHEXIDINE GLUCONATE CLOTH 2 % EX PADS: 6.0000 | MEDICATED_PAD | Freq: Every day | CUTANEOUS | Status: DC

## 2021-03-02 NOTE — ED Triage Notes (Signed)
Pt brought in by EMS from [facility] for shaking. Per crew, family was visiting the pt and noticed that she was shaking. Staff at the facility called the doctor who told them to monitor the pt but the family wanted her to come to the hospital.

## 2021-03-02 NOTE — ED Provider Notes (Signed)
[facility] EMERGENCY DEPARTMENT Provider Note   CSN: [id number] Arrival date & time: 03/02/21  2048     History  Chief Complaint  Patient presents with   Shaking    [name] is a 81 y.o. female.  [name] is a 81 y.o. female with history of hypertension, diabetes, CHF, GERD, hyperammonemia, AKI, who presents to the emergency department via EMS from skilled nursing facility for evaluation of shaking.  Patient presents from a [facility] accompanied by her daughter who reports that she had about a 30-minute episode where she was shaking, described to be like shivering although the patient denies feeling cold or having chills or fever.  She reports symptoms have improved now but when she holds out her hand she still has some shaking in her hands.  Family and facility denied seizure-like activity and she was alert and responsive throughout this period.  Has had some similar issues previously although family is unsure what was causing it.  Facility doctor had recommended continuing to monitor symptoms but family wanted her to be brought to the hospital.  She denies any fevers or recent illness.  No headache, chest pain, shortness of breath or abdominal pain.  No nausea or vomiting, no diarrhea.  Patient denies dysuria or urinary frequency.  No other complaints at this time.  On chart review it appears patient has had several recent admissions for hyperammonemia or for AKI.  The history is provided by the patient, the nursing home and a relative.      Home Medications Prior to Admission medications   Medication Sig Start Date End Date Taking? Authorizing Provider  ACCU-CHEK GUIDE test strip USE AS INSTRUCTED TO CHECK BLOOD SUGAR 3 TIMES DAILY. Patient taking differently: 1 each by Other route in the morning, at noon, and at bedtime. 08/08/20   Elayne Snare, MD  Accu-Chek Softclix Lancets lancets 1 each by Other route 3 (three) times daily. Use as instructed to  check blood sugar 3 times per day dx code E11.65 02/01/20   Elayne Snare, MD  Continuous Blood Gluc Sensor (FREESTYLE LIBRE 2 SENSOR) MISC 2 Devices by Does not apply route every 14 (fourteen) days. 04/25/20   Elayne Snare, MD  diclofenac Sodium (VOLTAREN) 1 % GEL Apply 2 g topically 4 (four) times daily. Patient taking differently: Apply 2 g topically 4 (four) times daily as needed (pain). 12/26/20   Lacinda Axon, MD  Dulaglutide (TRULICITY) 1.5 JO/8.4ZY SOPN Inject 1.5 mg into the skin every Thursday. 02/13/21   Orvis Brill, MD  gabapentin (NEURONTIN) 100 MG capsule Take 1 capsule (100 mg total) by mouth 2 (two) times daily. 02/12/21   Orvis Brill, MD  insulin lispro (HUMALOG KWIKPEN) 100 UNIT/ML KwikPen Inject 2 Units into the skin 3 (three) times daily. 02/12/21   Orvis Brill, MD  Insulin Lispro Prot & Lispro (HUMALOG MIX 75/25 KWIKPEN) (75-25) 100 UNIT/ML Kwikpen Inject 12 Units into the skin every morning. 02/12/21   Orvis Brill, MD  levETIRAcetam (KEPPRA) 500 MG tablet Take 1 tablet (500 mg total) by mouth 2 (two) times daily. 02/12/21   Orvis Brill, MD  memantine (NAMENDA) 10 MG tablet TAKE 1 TABLET BY MOUTH TWICE DAILY Patient taking differently: Take 10 mg by mouth 2 (two) times daily. 11/11/20   [name], Areeg N, DO  Misc Natural Products (IMMUNE FORMULA PO) Take 1 tablet by mouth daily.    [provider]  Multiple Vitamins-Minerals (MULTI FOR  HER 50+) TABS Take 1 tablet by mouth daily with breakfast.    [provider]  pantoprazole (PROTONIX) 40 MG tablet Take 1 tablet (40 mg total) by mouth 2 (two) times daily. 12/26/20   Lacinda Axon, MD  triamcinolone cream (KENALOG) 0.1 % Apply 1 application topically 2 (two) times daily.    [provider]  triamcinolone ointment (KENALOG) 0.5 % APPLY 1 APPLICATION TOPICALLY TO RASH TWICE DAILY FOR ITCHING Patient not taking: Reported on 02/03/2021 08/16/20   Riesa Pope, MD       Allergies    Aspirin    Review of Systems   Review of Systems  Constitutional:  Negative for chills and fever.  HENT: Negative.    Respiratory:  Negative for cough and shortness of breath.   Cardiovascular:  Negative for chest pain.  Gastrointestinal:  Negative for abdominal pain, nausea and vomiting.  Genitourinary:  Negative for dysuria.  Neurological:  Positive for tremors. Negative for seizures, syncope, weakness, numbness and headaches.  All other systems reviewed and are negative.  Physical Exam Updated Vital Signs BP (!) 152/50    Pulse 67    Temp 98.2 F (36.8 C) (Oral)    Resp 18    Ht 5\' 3"  (1.6 m)    Wt 62.8 kg    LMP  (LMP Unknown)    SpO2 95%    BMI 24.53 kg/m  Physical Exam Vitals and nursing note reviewed.  Constitutional:      General: She is not in acute distress.    Appearance: Normal appearance. She is well-developed. She is not diaphoretic.  HENT:     Head: Normocephalic and atraumatic.  Eyes:     General:        Right eye: No discharge.        Left eye: No discharge.     Pupils: Pupils are equal, round, and reactive to light.  Cardiovascular:     Rate and Rhythm: Normal rate and regular rhythm.     Pulses: Normal pulses.     Heart sounds: Normal heart sounds.  Pulmonary:     Effort: Pulmonary effort is normal. No respiratory distress.     Breath sounds: Normal breath sounds. No wheezing or rales.     Comments: Respirations equal and unlabored, patient able to speak in full sentences, lungs clear to auscultation bilaterally  Abdominal:     General: Bowel sounds are normal. There is no distension.     Palpations: Abdomen is soft. There is no mass.     Tenderness: There is no abdominal tenderness. There is no guarding.     Comments: Abdomen soft, nondistended, nontender to palpation in all quadrants without guarding or peritoneal signs  Musculoskeletal:        General: No deformity.     Cervical back: Neck supple.  Skin:    General: Skin is  warm and dry.     Capillary Refill: Capillary refill takes less than 2 seconds.  Neurological:     Mental Status: She is alert and oriented to person, place, and time.     Coordination: Coordination normal.     Comments: Speech is clear, able to follow commands CN III-XII intact Normal strength in upper and lower extremities bilaterally including dorsiflexion and plantar flexion, strong and equal grip strength Sensation normal to light and sharp touch Moves extremities without ataxia, slight tremor in the hands when extended but otherwise no shaking or tremor noted.  Psychiatric:  Mood and Affect: Mood normal.        Behavior: Behavior normal.    ED Results / Procedures / Treatments   Labs (all labs ordered are listed, but only abnormal results are displayed) Labs Reviewed  COMPREHENSIVE METABOLIC PANEL - Abnormal; Notable for the following components:      Result Value   Glucose, Bld 252 (*)    Creatinine, Ser 1.35 (*)    Total Protein 5.7 (*)    Albumin 2.8 (*)    GFR, Estimated 40 (*)    All other components within normal limits  CBC WITH DIFFERENTIAL/PLATELET - Abnormal; Notable for the following components:   RBC 3.80 (*)    Hemoglobin 11.2 (*)    HCT 34.8 (*)    Platelets 139 (*)    All other components within normal limits  URINALYSIS, ROUTINE W REFLEX MICROSCOPIC - Abnormal; Notable for the following components:   Glucose, UA 150 (*)    Protein, ur 30 (*)    Bacteria, UA RARE (*)    All other components within normal limits  AMMONIA - Abnormal; Notable for the following components:   Ammonia 109 (*)    All other components within normal limits  COMPREHENSIVE METABOLIC PANEL - Abnormal; Notable for the following components:   Potassium 3.4 (*)    Chloride 113 (*)    Glucose, Bld 202 (*)    Creatinine, Ser 1.23 (*)    Calcium 8.8 (*)    Total Protein 4.9 (*)    Albumin 2.5 (*)    GFR, Estimated 44 (*)    All other components within normal limits  CBC -  Abnormal; Notable for the following components:   RBC 3.50 (*)    Hemoglobin 10.2 (*)    HCT 31.8 (*)    All other components within normal limits  CBG MONITORING, ED - Abnormal; Notable for the following components:   Glucose-Capillary 193 (*)    All other components within normal limits  CBG MONITORING, ED - Abnormal; Notable for the following components:   Glucose-Capillary 188 (*)    All other components within normal limits  CBG MONITORING, ED - Abnormal; Notable for the following components:   Glucose-Capillary 161 (*)    All other components within normal limits  RESP PANEL BY RT-PCR (FLU A&B, COVID) [id number]  CBG MONITORING, ED    EKG None  Radiology DG Chest 2 View  Result Date: 03/02/2021 CLINICAL DATA:  Shaking altered mental status EXAM: CHEST - 2 VIEW COMPARISON:  02/03/2021, CT 02/05/2021 FINDINGS: Right-sided central venous port tip over the SVC. Cardiomegaly with mild central congestion. Left upper lobe nodular opacity. Aortic atherosclerosis. No pneumothorax. IMPRESSION: 1. Cardiomegaly with mild central congestion. 2. Vague left upper lobe nodular consolidation corresponding to the CT abnormality. This appears slightly smaller radiographically as compared with 02/03/2021. Continued follow-up to resolution is recommended. Electronically Signed   By: Donavan Foil M.D.   On: 03/02/2021 22:20     Procedures Procedures    Medications Ordered in ED Medications  sodium chloride 0.9 % bolus 500 mL (0 mLs Intravenous Stopped 03/03/21 0112)  lactulose (CHRONULAC) 10 GM/15ML solution 30 g (30 g Oral Given 03/03/21 0015)  amLODipine (NORVASC) tablet 2.5 mg (2.5 mg Oral Given 03/03/21 0304)  potassium chloride (KLOR-CON) packet 40 mEq (40 mEq Oral Given 03/03/21 1237)    ED Course/ Medical Decision Making/ A&P  81 y.o. female presents to the ED with complaints of shaking, this involves an extensive number of treatment options, and is a complaint that  carries with it a high risk of complications and morbidity.  The differential diagnosis includes fever, infection, tremor, asterixis, electrolyte derangement, hyperammonemia (Ddx)  On arrival pt is nontoxic, vitals significant for hypertension, but otherwise unremarkable. Exam significant for slight tremor of the hands when extended but no other shaking or tremor noted, no other neurologic deficit  Additional history obtained from daughter at bedside.  Outside records obtained and reviewed from recent hospital admissions   Lab Tests:  I Ordered, reviewed, and interpreted labs, which included:  CBC: No leukocytosis, hemoglobin at baseline CMP: Glucose 252, creatinine slightly increased from baseline, but no other significant derangements UA: No evidence of infection Ammonia: 109  Imaging Studies ordered:  I ordered imaging studies which included chest x-ray, I independently visualized and interpreted imaging which showed cardiomegaly with mild central congestion, vague left upper lobe nodular consolidation slightly smaller compared to CT in December.  ED Course:   Patient presents with shaking episode from nursing home.  Found to have elevated ammonia level, likely causing shaking and asterixis on exam.  The rest of patient's work-up is overall been reassuring.  Very slightly increased creatinine from baseline.  Patient started on lactulose, will admit to the hospital.  Family reports that patient has been a bit altered from baseline and not eating and drinking as well since yesterday as well.  Here she is alert, pleasant and responsive.  I consulted internal medicine teaching service, case discussed including pertinent labs, imaging and plan, they will see and admit the patient.    Portions of this note were generated with [employer]. Dictation errors may occur despite best attempts at proofreading.         Final Clinical Impression(s) / ED Diagnoses Final  diagnoses:  Hyperammonemia [facility])    Rx / DC Orders ED Discharge Orders     None         Janet Berlin 03/06/21 [place], MD 03/06/21 2333

## 2021-03-02 NOTE — H&P (Addendum)
Date: 03/03/2021               Patient Name:  [name] MRN: [id number]  DOB: [birth year] Age / Sex: 81 y.o., female   PCP: Mike Craze, DO         Medical Service: Internal Medicine Teaching Service         Attending Physician: Charise Killian, MD    First Contact: Mitzie Na, MD Pager: MB [phone]  Second Contact: Gaylan Gerold, DO Pager: Tenny Craw       After Hours (After 5p/  First Contact Pager: [phone]  weekends / holidays): Second Contact Pager: [phone]    Chief Complaint: shaking   History of Present Illness:  [name] is a 81 y.o. 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   Patient is accompanied by her god daughter, [name], who helps to provide much of the interval history. Pt was in her usual state of health until yesterday, when SNF nursing staff and pt's family noticed pt was not eating or responding to questions. This made the family concerned as previous similar episodes resulted in hospital admission for AMS, last one being 02/03/21-02/12/21, which tend to improve with lactulose and IVF. Was not brought to the ED at that time and noted rapid improvement in AMS this AM, however today nursing staff noticed that pt was "shaking" this evening. The nursing staff reports whole body shaking that lasted 30 minutes. Not post-ictal state or confusion. No known bowel or bladder loss. No head trauma. No abnormalities noted during or after episode. Patient was at her baseline, and was communicating during episodes. Nursing staff reports low suspicion for seizure, however given acute events x2 days, pt was sent to the ED.  Denies melena or hematochezia. No CP or SHOB. Pt reports feeling  cold, and is covered in ~7 blankets. Pt is otherwise at her baseline.  Initial ED workup with CBC and CMP at baseline. Vitals stable.  Meds:  Current Facility-Administered Medications on File Prior to Encounter  Medication Dose Route Frequency Provider Last Rate Last Admin   sodium chloride 0.9 % injection 10 mL  10 mL Intracatheter PRN Truitt Merle, MD       sodium chloride flush (NS) 0.9 % injection 10 mL  10 mL Intracatheter PRN Truitt Merle, MD   10 mL at 08/18/19 1717   Current Outpatient Medications on File Prior to Encounter  Medication Sig Dispense Refill   acetaminophen (TYLENOL) 325 MG tablet Take 650 mg by mouth every 4 (four) hours as needed for moderate pain.     diclofenac Sodium (VOLTAREN) 1 % GEL Apply 2 g topically 4 (four) times daily. (Patient taking differently: Apply 2 g topically 4 (four) times daily. shoulder) 100 g 1   Dulaglutide (TRULICITY) 1.5 YW/7.3XT SOPN Inject 1.5 mg into the skin every Thursday.     gabapentin (NEURONTIN) 100 MG capsule Take 1 capsule (100 mg total) by mouth 2 (two) times daily.     insulin lispro (HUMALOG KWIKPEN) 100 UNIT/ML KwikPen Inject 2 Units into the skin 3 (three) times daily.     Insulin Lispro Prot & Lispro (HUMALOG MIX 75/25 KWIKPEN) (75-25) 100 UNIT/ML Kwikpen Inject 12 Units into the  skin every morning.     levETIRAcetam (KEPPRA) 500 MG tablet Take 1 tablet (500 mg total) by mouth 2 (two) times daily.     memantine (NAMENDA) 10 MG tablet TAKE 1 TABLET BY MOUTH TWICE DAILY (Patient taking differently: Take 10 mg by mouth 2 (two) times daily.) 60 tablet 10   Misc Natural Products (IMMUNE FORMULA PO) Take 1 tablet by mouth daily.     Multiple Vitamins-Minerals (MULTI FOR HER 50+) TABS Take 1 tablet by mouth daily with breakfast.     NON FORMULARY See admin instructions. Compression Stockings apply in the morning and remove at bedtime     pantoprazole (PROTONIX) 40 MG tablet Take 1 tablet (40 mg total) by mouth 2 (two) times daily. 60 tablet  2   polyethylene glycol powder (GLYCOLAX/MIRALAX) 17 GM/SCOOP powder Take 17 g by mouth daily as needed for mild constipation.     triamcinolone cream (KENALOG) 0.1 % Apply 1 application topically 2 (two) times daily as needed (itching).     triamcinolone ointment (KENALOG) 0.5 % APPLY 1 APPLICATION TOPICALLY TO RASH TWICE DAILY FOR ITCHING (Patient taking differently: Apply 1 application topically 2 (two) times daily as needed (itching).) 30 g 10   ACCU-CHEK GUIDE test strip USE AS INSTRUCTED TO CHECK BLOOD SUGAR 3 TIMES DAILY. (Patient taking differently: 1 each by Other route in the morning, at noon, and at bedtime.) 100 each 0   Accu-Chek Softclix Lancets lancets 1 each by Other route 3 (three) times daily. Use as instructed to check blood sugar 3 times per day dx code E11.65 100 each 3   Continuous Blood Gluc Sensor (FREESTYLE LIBRE 2 SENSOR) MISC 2 Devices by Does not apply route every 14 (fourteen) days. 2 each 3   traMADol (ULTRAM) 50 MG tablet Take 50 mg by mouth See admin instructions. Every 4 hours as needed for pain x 14 days (Patient not taking: Reported on 03/03/2021)      Allergies: Allergies as of 03/02/2021 - Review Complete 03/02/2021  Allergen Reaction Noted   Aspirin Nausea And Vomiting 05/12/2006   Past Medical History:  Diagnosis Date   Chronic anemia    Chronic diastolic CHF (congestive heart failure) ([place]) 10/03/2013   Chronic GI bleeding    /notes 11/29/2014   Family history of anesthesia complication    "niece has a hard time coming out" (09/15/2012)   Frequent nosebleeds    chronic   Gastric AV malformation    /notes 11/29/2014   GERD (gastroesophageal reflux disease)    Heart murmur 04/01/2017   Moderate AVSC on echo 09/2016   History of blood transfusion "several"   History of epistaxis    HTN (hypertension), benign 03/02/2012   Hyperlipidemia    Iron deficiency anemia    chronic infusions"   Lichen planus    Both lower extremities   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    Family History  Problem Relation Age of Onset   Healthy Mother    Stroke Father    Diabetes Niece    Breast cancer Other    Malignant hyperthermia Neg Hx    Seizures Neg  Hx     Social History:   Recently at [name] Daughter denies alcohol, tobacco, or illegal drug use  Review of Systems: A complete ROS was negative except as per HPI.    Physical Exam: Blood pressure (!) 186/61, pulse 66, temperature 98.2 F (36.8 C), temperature source Oral, resp. rate 16, height 5\' 3"  (1.6 m), weight 62.8 kg, SpO2 97 %.  Constitutional: alert, well-appearing, in NAD HENT: atraumatic, mucous membranes moist, no tongue laceration noted Eyes: conjunctiva not pale, EOMI Cardiovascular: RRR, no m/r/g, non-edematous bilateral LE Pulmonary/Chest: normal work of breathing on room air, LCTAB Abdominal: soft, non-tender to palpation, non-distended Neurological: A&O to person and place; follows commands Extremities: strength 4/5 bilaterally, hand tremors present L>R, no asterixis  Skin: warm and dry  EKG: Sinus brady   CXR: Vague left upper lobe nodular consolidation that appears slightly smaller radiographically as compared to CT 02/03/2021 during prior admission.   Assessment & Plan by Problem: Principal Problem:   Altered mental status Active Problems:   HHT (hereditary hemorrhagic telangiectasia) ([name]   Type 2 diabetes mellitus without complication ([name]   Chronic diastolic CHF (congestive heart failure) ([name]   Acute kidney injury ([name]   Hyperammonemia ([name]   HTN (hypertension), benign   PAF (paroxysmal atrial fibrillation) ([name]   Teyona Nichelson Rhem is a  81 y.o. female with numerous PMHx, admitted for shaking spells in setting of transient AMS (resolved).   Shaking spell History of seizures  30 minutes of shaking spell with whole body shaking. No post-ictal state or confusion. No known bowel or bladder loss. No head trauma. No abnormalities noted during or after episode. Was at her baseline, and was communicating during episodes. Resolved spontaneously on arrival to ED. Per [name], much improvement now compared to onset earlier today. Has hx of seizures, but low suspicion for seizure at this time given presentation and appropriate medication compliance to Keppra maintenance dose. Has elevated ammonia (see below) but no asterixis on exam. 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 - Continue home Keppra  Chronic hyperammonemia  Pronounced echogenicity in right hepatic lobe On chart review, she has a history of chronically elevated ammonia level dating back at least 6 years in our system. Numerous hospitalizations for AMS in the setting of AKI or hyperammonemia requiring IVF and lactulose. Today, admission ammonia level elevated to 109, and normal LFTs. Imaging obtained during prior admission with RUQ US imaging on 02-21-23 showed pronounced echogenicity in the right hepatic lobe concerning for liver disease versus vascular anomaly; MRI/CT showed no evidence of metastatic disease.  - S/p lactulose and IVF in ED - Delirium precautions    Altered mental status, resolved Hx of dementia  Concerns for AMS when noted to not be eating or responding to questions at SNF yesterday; resolved this AM per family. Similar presentation during prior admission, thought to be 2/2 AKI since pt's status improved with IVF. No AKI today, but hx positive for poor PO intake. Received 1/2L IVF today in ED. Is oriented to person and place, at appears at baseline. Low concern for infection given no leukocytosis,  pt afebrile, CXR without acute findings, and UA negative. Serum chemistries unremarkable for a metabolic cause. Glucose 252 on presentation. Structural causes are much less likely given numerous previous negative intracranial imaging, and neuro exam reassuring.  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 No concern for ABLA at this time; denies melena or hematochezia. Follows Dr Burr Medico hematology, and is on Avastin intermittently/PRN due to high proteinuria, and receives prn transfusions. MRI on 02/04/2021 showed signs of iron deposits in liver, spleen and marrow space.  - Protonix 40 bid resumed  - Trend CBC - Monitor closely for any signs of bleeding - Judicious blood transfusions given evidence of iron deposition - Transfuse if Hgb <7 and hemodynamically unstable - Continue outpatient hematology follow up, next visit on 04/02/21  Proteinuria U/A with proteinuria; not present on past several U/A's. Per chart review, has a hx of proteinuria 2/2 Avastin use (see above), however important to recognize that she also has a hx of insulin-dependent T2DM. - Consider P/C ratio  - Consider starting ACEi/ARB at time of d/c, if appropriate (see below).  Hypertension Hypertensive at this time. Previously on amlodipine 2.5 mg daily but was d/c'd during previous d/c after hypotension 2/2 hemorrhagic shock from GI bleed.  - Amlodipine 2.5 mg given  - Previous home Amlodipine not scheduled, but will consider restarting if pt remains hypertensive  CKD 3a Stable; Cr of 1.35, baseline around 1.4.  - BMP - Avoid nephrotoxic agents   Insulin dependence T2DM Diabetic neuropathy Last A1c 6.2%. On Trulicity, insulin lispro 2U tid, and insulin lispro  pro & lispro 12U (75-25).  - SSI - CBG monitoring - Gabapentin resumed - Follow up on P/C ratio   Chronic HFpEF (65 to 70%) Most recent echo in August 2022 showed EF of 65 to 44%, grade 2 diastolic dysfunction and severe left atrial enlargement. Previously on torsemide 20 mg prn, but dc'd during previous hospital d/c after hypotension 2/2 hemorrhagic shock from GI bleed. She is clinically euvolemic on exam. - Strict I/O's - Daily weights  - Continue to assess fluid status and start diuretic as needed   GERD - Continue home Protonix 40 mg   Left upper lobe consolidation CT chest 12/28 with remarkable for rounded left upper lobe consolidation favoring focal pneumonia over neoplasm with recommendation of follow-up imaging in 6 to 8 weeks to assess for resolution of focal pneumonia versus rule out neoplasm. CXR today showing slightly improved consolidation. Patient was previously on IV ceftriaxone 2 g daily for concern for lobar pneumonia. 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 - Incidental finding, follow-up as outpatient - Transfuse conservatively if needed     Best Practice: Diet: Dysphasia 3 diet  IVF: None,None VTE: Enoxaparin Code: Full   Lajean Manes, MD  Internal Medicine Resident, PGY-1 [name]  Pager: [phone] 1:38 AM, 03/03/2021

## 2021-03-03 DIAGNOSIS — E7220 Disorder of urea cycle metabolism, unspecified: Secondary | ICD-10-CM

## 2021-03-03 DIAGNOSIS — I5032 Chronic diastolic (congestive) heart failure: Secondary | ICD-10-CM

## 2021-03-03 DIAGNOSIS — D509 Iron deficiency anemia, unspecified: Secondary | ICD-10-CM

## 2021-03-03 DIAGNOSIS — R161 Splenomegaly, not elsewhere classified: Secondary | ICD-10-CM

## 2021-03-03 DIAGNOSIS — N1831 Chronic kidney disease, stage 3a: Secondary | ICD-10-CM

## 2021-03-03 DIAGNOSIS — I130 Hypertensive heart and chronic kidney disease with heart failure and stage 1 through stage 4 chronic kidney disease, or unspecified chronic kidney disease: Secondary | ICD-10-CM

## 2021-03-03 DIAGNOSIS — E1122 Type 2 diabetes mellitus with diabetic chronic kidney disease: Secondary | ICD-10-CM

## 2021-03-03 DIAGNOSIS — Q828 Other specified congenital malformations of skin: Secondary | ICD-10-CM

## 2021-03-03 DIAGNOSIS — E1140 Type 2 diabetes mellitus with diabetic neuropathy, unspecified: Secondary | ICD-10-CM

## 2021-03-03 DIAGNOSIS — R809 Proteinuria, unspecified: Secondary | ICD-10-CM

## 2021-03-03 LAB — URINALYSIS, ROUTINE W REFLEX MICROSCOPIC
Bilirubin Urine: NEGATIVE
Glucose, UA: 150 mg/dL — AB
Hgb urine dipstick: NEGATIVE
Ketones, ur: NEGATIVE mg/dL
Leukocytes,Ua: NEGATIVE
Nitrite: NEGATIVE
Protein, ur: 30 mg/dL — AB
Specific Gravity, Urine: 1.011 (ref 1.005–1.030)
pH: 6 (ref 5.0–8.0)

## 2021-03-03 LAB — COMPREHENSIVE METABOLIC PANEL
ALT: 15 U/L (ref 0–44)
AST: 33 U/L (ref 15–41)
Albumin: 2.5 g/dL — ABNORMAL LOW (ref 3.5–5.0)
Alkaline Phosphatase: 97 U/L (ref 38–126)
Anion gap: 6 (ref 5–15)
BUN: 9 mg/dL (ref 8–23)
CO2: 24 mmol/L (ref 22–32)
Calcium: 8.8 mg/dL — ABNORMAL LOW (ref 8.9–10.3)
Chloride: 113 mmol/L — ABNORMAL HIGH (ref 98–111)
Creatinine, Ser: 1.23 mg/dL — ABNORMAL HIGH (ref 0.44–1.00)
GFR, Estimated: 44 mL/min — ABNORMAL LOW (ref >60)
Glucose, Bld: 202 mg/dL — ABNORMAL HIGH (ref 70–99)
Potassium: 3.4 mmol/L — ABNORMAL LOW (ref 3.5–5.1)
Sodium: 143 mmol/L (ref 135–145)
Total Bilirubin: 1.2 mg/dL (ref 0.3–1.2)
Total Protein: 4.9 g/dL — ABNORMAL LOW (ref 6.5–8.1)

## 2021-03-03 LAB — CBC
HCT: 31.8 % — ABNORMAL LOW (ref 36.0–46.0)
Hemoglobin: 10.2 g/dL — ABNORMAL LOW (ref 12.0–15.0)
MCH: 29.1 pg (ref 26.0–34.0)
MCHC: 32.1 g/dL (ref 30.0–36.0)
MCV: 90.9 fL (ref 80.0–100.0)
Platelets: 156 10*3/uL (ref 150–400)
RBC: 3.5 MIL/uL — ABNORMAL LOW (ref 3.87–5.11)
RDW: 14.3 % (ref 11.5–15.5)
WBC: 5.3 10*3/uL (ref 4.0–10.5)
nRBC: 0 % (ref 0.0–0.2)

## 2021-03-03 LAB — CBG MONITORING, ED
Glucose-Capillary: 193 mg/dL — ABNORMAL HIGH (ref 70–99)
Glucose-Capillary: 188 mg/dL — ABNORMAL HIGH (ref 70–99)
Glucose-Capillary: 161 mg/dL — ABNORMAL HIGH (ref 70–99)
Glucose-Capillary: 95 mg/dL (ref 70–99)

## 2021-03-03 LAB — RESP PANEL BY RT-PCR (FLU A&B, COVID) ARPGX2
Influenza A by PCR: NEGATIVE
Influenza B by PCR: NEGATIVE
SARS Coronavirus 2 by RT PCR: NEGATIVE

## 2021-03-03 MED ORDER — LEVETIRACETAM 500 MG PO TABS
500.0000 mg | ORAL_TABLET | Freq: Two times a day (BID) | ORAL | Status: DC
  Administered 2021-03-03 (×2): 500 mg via ORAL
  Filled 2021-03-03 (×2): qty 1

## 2021-03-03 MED ORDER — INSULIN ASPART 100 UNIT/ML IJ SOLN
0.0000 [IU] | Freq: Three times a day (TID) | INTRAMUSCULAR | Status: DC
  Administered 2021-03-03 (×2): 2 [IU] via SUBCUTANEOUS

## 2021-03-03 MED ORDER — ENOXAPARIN SODIUM 30 MG/0.3ML IJ SOSY
30.0000 mg | PREFILLED_SYRINGE | INTRAMUSCULAR | Status: DC
  Administered 2021-03-03: 30 mg via SUBCUTANEOUS
  Filled 2021-03-03: qty 0.3

## 2021-03-03 MED ORDER — INSULIN ASPART 100 UNIT/ML IJ SOLN: 0.0000 [IU] | Freq: Every day | INTRAMUSCULAR | Status: DC

## 2021-03-03 MED ORDER — GABAPENTIN 100 MG PO CAPS
100.0000 mg | ORAL_CAPSULE | Freq: Two times a day (BID) | ORAL | Status: DC
  Administered 2021-03-03 (×2): 100 mg via ORAL
  Filled 2021-03-03 (×2): qty 1

## 2021-03-03 MED ORDER — MEMANTINE HCL 10 MG PO TABS
10.0000 mg | ORAL_TABLET | Freq: Two times a day (BID) | ORAL | Status: DC
  Administered 2021-03-03 (×2): 10 mg via ORAL
  Filled 2021-03-03 (×4): qty 1

## 2021-03-03 MED ORDER — POTASSIUM CHLORIDE 20 MEQ PO PACK
40.0000 meq | PACK | Freq: Once | ORAL | Status: AC
  Administered 2021-03-03: 40 meq via ORAL
  Filled 2021-03-03: qty 2

## 2021-03-03 MED ORDER — LACTULOSE 10 G PO PACK
10.0000 g | PACK | Freq: Every day | ORAL | 0 refills | Status: DC
Start: 2021-03-03 — End: 2021-10-01

## 2021-03-03 MED ORDER — POLYETHYLENE GLYCOL 3350 17 G PO PACK: 17.0000 g | PACK | Freq: Every day | ORAL | Status: DC | PRN

## 2021-03-03 MED ORDER — AMLODIPINE BESYLATE 5 MG PO TABS
2.5000 mg | ORAL_TABLET | Freq: Once | ORAL | Status: AC
  Administered 2021-03-03: 2.5 mg via ORAL
  Filled 2021-03-03: qty 1

## 2021-03-03 MED ORDER — LEVETIRACETAM IN NACL 500 MG/100ML IV SOLN
500.0000 mg | Freq: Two times a day (BID) | INTRAVENOUS | Status: DC
  Filled 2021-03-03: qty 100

## 2021-03-03 MED ORDER — PANTOPRAZOLE SODIUM 40 MG PO TBEC
40.0000 mg | DELAYED_RELEASE_TABLET | Freq: Two times a day (BID) | ORAL | Status: DC
  Administered 2021-03-03 (×2): 40 mg via ORAL
  Filled 2021-03-03 (×2): qty 1

## 2021-03-03 MED ORDER — AMLODIPINE BESYLATE 2.5 MG PO TABS: 2.5000 mg | ORAL_TABLET | Freq: Every day | ORAL | 11 refills | Status: DC

## 2021-03-03 MED ORDER — POTASSIUM CHLORIDE 20 MEQ PO PACK: 40.0000 meq | PACK | Freq: Two times a day (BID) | ORAL | Status: DC

## 2021-03-03 NOTE — Progress Notes (Signed)
[place] [id number]) Hosptial Liaison note:  This patient is currently enrolled in [facility] outpatient-based Palliative Care. Will continue to follow for disposition.  Please call with any outpatient palliative questions or concerns.  Thank you, Lorelee Market, LPN [facility] Liaison [phone]

## 2021-03-03 NOTE — Discharge Summary (Addendum)
Name: [name] MRN: [id number] DOB: November 10, 1940 81 y.o. PCP: Mike Craze, DO  Date of Admission: 03/02/2021  8:48 PM Date of Discharge: 03/03/2021 Attending Physician: Charise Killian, MD  Discharge Diagnosis: 1. Shaking spells, history of seizures 2. Altered Mental Status (resolved), h/o dementia 3. Chronic hyperammonemia, pronounced echogenicity in right hepatic lobe 4. History of hereditary hemorrhagic telangiectasia syndrome 5. H/o of UGIB s/p clipping 6. H/o Iron deficiency anemia 7. Secondary Hemochromatosis 8. CKD stage IIIa 9. T2DM with insulin dependence, peripheral neuropathy 10. HFpEF, chronic 11. HTN 12. Left upper lobe consolidation 13. Splenic mass 14. GERD 15. Splenic mass 16. Proteinuria   Discharge Medications: Allergies as of 03/03/2021       Reactions   Aspirin Nausea And Vomiting        Medication List     STOP taking these medications    traMADol 50 MG tablet Commonly known as: ULTRAM       TAKE these medications    Accu-Chek Guide test strip Generic drug: glucose blood USE AS INSTRUCTED TO CHECK BLOOD SUGAR 3 TIMES DAILY. What changed: See the new instructions.   Accu-Chek Softclix Lancets lancets 1 each by Other route 3 (three) times daily. Use as instructed to check blood sugar 3 times per day dx code E11.65   acetaminophen 325 MG tablet Commonly known as: TYLENOL Take 650 mg by mouth every 4 (four) hours as needed for moderate pain.   amLODipine 2.5 MG tablet Commonly known as: NORVASC Take 1 tablet (2.5 mg total) by mouth daily.   diclofenac Sodium 1 % Gel Commonly known as: VOLTAREN Apply 2 g topically 4 (four) times daily. What changed: additional instructions   FreeStyle Libre 2 Sensor Misc 2 Devices by Does not apply route every 14 (fourteen) days.   gabapentin 100 MG capsule Commonly known as: NEURONTIN Take 1 capsule (100 mg total) by mouth 2 (two) times daily.   IMMUNE FORMULA PO Take 1 tablet by mouth  daily.   insulin lispro 100 UNIT/ML KwikPen Commonly known as: HumaLOG KwikPen Inject 2 Units into the skin 3 (three) times daily.   Insulin Lispro Prot & Lispro (75-25) 100 UNIT/ML Kwikpen Commonly known as: HumaLOG Mix 75/25 KwikPen Inject 12 Units into the skin every morning.   lactulose 10 g packet Commonly known as: CEPHULAC Take 1 packet (10 g total) by mouth daily.   levETIRAcetam 500 MG tablet Commonly known as: KEPPRA Take 1 tablet (500 mg total) by mouth 2 (two) times daily.   memantine 10 MG tablet Commonly known as: NAMENDA TAKE 1 TABLET BY MOUTH TWICE DAILY   Multi For Her 50+ Tabs Take 1 tablet by mouth daily with breakfast.   NON FORMULARY See admin instructions. Compression Stockings apply in the morning and remove at bedtime   pantoprazole 40 MG tablet Commonly known as: PROTONIX Take 1 tablet (40 mg total) by mouth 2 (two) times daily.   polyethylene glycol powder 17 GM/SCOOP powder Commonly known as: GLYCOLAX/MIRALAX Take 17 g by mouth daily as needed for mild constipation.   triamcinolone cream 0.1 % Commonly known as: KENALOG Apply 1 application topically 2 (two) times daily as needed (itching). What changed: Another medication with the same name was changed. Make sure you understand how and when to take each.   triamcinolone ointment 0.5 % Commonly known as: KENALOG APPLY 1 APPLICATION TOPICALLY TO RASH TWICE DAILY FOR ITCHING What changed: See the new instructions.   Trulicity 1.5 QI/6.9GE Sopn Generic drug:  Dulaglutide Inject 1.5 mg into the skin every Thursday.        Disposition and follow-up:   [name] was discharged from [facility] in Stable condition.  At the hospital follow up visit please address:  Shaking spells, AMS (resolved), h/o epilepsy: Not believed to be related to be seizure (see below for details). Patient was discharged in stable condition with recommendation to follow-up with cardiology,  hematology, and GI. (Will need referral to GI and cardiology, has FU scheduled with heme).  Chronic hyperammonemia: Started on low-dose lactulose at DC.  Left upper lobe consolidation: CT chest 12/28 with remarkable for rounded left upper lobe consolidation favoring focal pneumonia over neoplasm with recommendation of follow-up imaging in 6 to 8 weeks to assess for resolution of focal pneumonia versus rule out neoplasm. CXR here showing slightly improved consolidation. Pt will need follow-up imaging in 4-6 weeks. If no resolution on follow-up imaging, she may need a bronchoscopy.  Hypertension: Resumed home amlodipine at DC.  Splenic mass: Incidental finding, likely hemangioma. Follow-up as outpatient.  Proteinuria: Not present on past several UA's. Can consider obtaining P/C ratio as outpatient. Consider starting ACEi/ARB as outpatient.  2.  Labs / imaging needed at time of follow-up: Consider repeat ammonia levels, UA and/or P/C ratio for proteinuria  3.  Pending labs/ test needing follow-up: None  Follow-up Appointments: Patient will follow-up with outpatient hematology on 04/02/2021.  Hospital Course by problem list:  Shaking spells, history of epilepsy, AMS (resolved), h/o dementia Prior to admission nursing staff at the facility noted patient was shaking all over. This lasted about 30 minutes but was notable for patient communicating during this episode and at her mental baseline.  No postictal confusion or altered mental status during this time.  Of note, family and nursing staff had also noted that the patient was not acting herself the day before this episode, however patient returned to baseline on the morning prior to admission. On initial evaluation patient was hypertensive, afebrile, and otherwise hemodynamically stable. ED provider reported concerns for asterixis on exam. CBC with stable chronic normocytic anemia and mild thrombocytopenia. UA with evidence of glucose and protein,  otherwise unremarkable. Has elevated ammonia to 109 (see below) though by itself not concerning for HE.  Low suspicion for seizure given presentation and appropriate medication compliance to Keppra maintenance dose.  Patient was continued on her home Keppra and memantine regimens during this hospitalization. Patient also received a 500 cc NS bolus and 1 dose of oral lactulose overnight.  The following day, the patient was oriented to person, place, and month.  She was observed in the hospital and did not have recurrence of these shaking episodes.  Patient also denied having further shaking episodes.  She was discharged in stable condition with recommendation to follow-up with cardiology, hematology, and GI (see below for further details).  Chronic hyperammonemia On chart review, she has a history of chronically elevated ammonia level dating back at least 6 years in our system. Numerous hospitalizations for AMS in the setting of AKI or hyperammonemia requiring IVF and lactulose. In the ED last night, the patient was noted to have asterixis although this was not noted on our admission exam overnight.  Ammonia levels were elevated to 109, normal LFTs.  This morning, patient had a mild bilateral postural tremor of the hands with 1-2 flapping motions bilaterally and extension.  Possible mild asterixis although difficult to distinguish in the setting of tremor.  It was noted that patient  stopped taking lactulose during an admission in August 2022.  Therefore, we restarted low-dose lactulose at discharge.  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 No concern for ABLA at this time; denies melena or hematochezia. Follows Dr Burr Medico hematology, and is on Avastin intermittently/PRN due to high proteinuria, and receives prn transfusions. MRI on 02/04/2021 showed  signs of iron deposits in liver, spleen and marrow space.  Resumed Protonix 40 mg twice daily, and the patient was monitored closely for any signs of bleeding.  She did not have any recurrent episodes she was here.  Patient will follow-up with outpatient hematology on 04/02/2021.  CKD 3a Stable; Cr of 1.35 on admit with baseline around 1.4. Trended BMPs during her hospitalization.  Hypertension Hypertensive on admit. Previously on amlodipine 2.5 mg daily but was d/c'd during previous d/c after hypotension 2/2 hemorrhagic shock from GI bleed. On admit, the pt was given 1x Amlodipine 2.5 mg. Her home amlodipine was resumed due to patient remaining hypertensive in the hospital.   Type 2 diabetes with insulin dependence Diabetic neuropathy Last A1c 6.2%, patient was managed with short and long-acting insulin throughout her hospital stay.  Chronic HFpEF (65 to 70%) Most recent echo in August 2022 showed EF of 65 to 66%, grade 2 diastolic dysfunction and severe left atrial enlargement. Previously on torsemide 20 mg prn, but dc'd during previous hospital d/c after hypotension 2/2 hemorrhagic shock from GI bleed. Pt was clinically euvolemic on exam throughout her hospitalization, and no further actions were taken.  Pronounced echogenicity in right hepatic lobe Imaging obtained during prior admission with RUQ US imaging on 2023-02-17 showed pronounced echogenicity in the right hepatic lobe concerning for liver disease versus vascular anomaly; MRI/CT showed no evidence of metastatic disease. Recommend follow-up with GI as above.   Left upper lobe consolidation CT chest February 17, 2023 with remarkable for rounded left upper lobe consolidation favoring focal pneumonia over neoplasm with recommendation of follow-up imaging in 6 to 8 weeks to assess for resolution of focal pneumonia versus rule out neoplasm. CXR here showing slightly improved consolidation. Patient was previously on IV ceftriaxone 2 g daily for concern for  lobar pneumonia. Repeat imaging in ~4 weeks -> may require bronchoscopy if no resolution on f/u imaging.  Splenic mass MRI liver during prior admission 02/17/23) noted variable signal in the spleen raising the question of underlying splenic lesion, and CT abdomen showed well-circumscribed 3.4 cm splenic mass favoring hemangioma given decreased prominence on delayed imaging, and signs of iron deposition in the spleen. Incidental finding, follow-up as outpatient.  Proteinuria U/A with proteinuria; not present on past several U/A's. Per chart review, has a hx of proteinuria 2/2 Avastin use (see above), however important to recognize that she also has a hx of insulin-dependent T2DM. Can consider obtaining P/C ratio as outpatient. Consider starting ACEi/ARB as outpatient.  Discharge Exam:   BP (!) 149/64    Pulse (!) 57    Temp 98.2 F (36.8 C) (Oral)    Resp 18    Ht 5\' 3"  (1.6 m)    Wt 62.8 kg    LMP  (LMP Unknown)    SpO2 98%    BMI 24.53 kg/m  Discharge exam:  Constitutional: alert, well-appearing, in NAD HENT: atraumatic, mucous membranes moist Eyes: conjunctiva not pale, EOMI Cardiovascular: RRR, no m/r/g Pulmonary/Chest: normal work of breathing on room air, LCTAB Abdominal:  soft, non-tender to palpation, non-distended Neurological: A&O to person and place; follows commands, mild 1-2 flapping tremors with arms in extension Extremities: no swelling or deformity Skin: warm and dry  Pertinent Labs, Studies, and Procedures:  CBC Latest Ref Rng & Units 03/03/2021 03/02/2021 02/19/2021  WBC 4.0 - 10.5 K/uL 5.3 5.9 4.7  Hemoglobin 12.0 - 15.0 g/dL 10.2(L) 11.2(L) 9.1(L)  Hematocrit 36.0 - 46.0 % 31.8(L) 34.8(L) 28.8(L)  Platelets 150 - 400 K/uL 156 139(L) 199   CMP Latest Ref Rng & Units 03/03/2021 03/02/2021 02/19/2021  Glucose 70 - 99 mg/dL 202(H) 252(H) 131(H)  BUN 8 - 23 mg/dL 9 9 9   Creatinine 0.44 - 1.00 mg/dL 1.23(H) 1.35(H) 1.13(H)  Sodium 135 - 145 mmol/L 143 143 145  Potassium 3.5 -  5.1 mmol/L 3.4(L) 3.6 4.1  Chloride 98 - 111 mmol/L 113(H) 108 113(H)  CO2 22 - 32 mmol/L 24 22 28   Calcium 8.9 - 10.3 mg/dL 8.8(L) 9.1 8.7(L)  Total Protein 6.5 - 8.1 g/dL 4.9(L) 5.7(L) 5.1(L)  Total Bilirubin 0.3 - 1.2 mg/dL 1.2 1.0 0.5  Alkaline Phos 38 - 126 U/L 97 100 86  AST 15 - 41 U/L 33 30 25  ALT 0 - 44 U/L 15 14 11    DG Chest 2 View  Result Date: 03/02/2021 CLINICAL DATA:  Shaking altered mental status EXAM: CHEST - 2 VIEW COMPARISON:  02/03/2021, CT 02/05/2021 FINDINGS: Right-sided central venous port tip over the SVC. Cardiomegaly with mild central congestion. Left upper lobe nodular opacity. Aortic atherosclerosis. No pneumothorax. IMPRESSION: 1. Cardiomegaly with mild central congestion. 2. Vague left upper lobe nodular consolidation corresponding to the CT abnormality. This appears slightly smaller radiographically as compared with 02/03/2021. Continued follow-up to resolution is recommended. Electronically Signed   By: Donavan Foil M.D.   On: 03/02/2021 22:20    Discharge Instructions: Discharge Instructions     (HEART FAILURE PATIENTS) Call MD:  Anytime you have any of the following symptoms: 1) 3 pound weight gain in 24 hours or 5 pounds in 1 week 2) shortness of breath, with or without a dry hacking cough 3) swelling in the hands, feet or stomach 4) if you have to sleep on extra pillows at night in order to breathe.   Complete by: As directed    Call MD for:  difficulty breathing, headache or visual disturbances   Complete by: As directed    Call MD for:  persistant nausea and vomiting   Complete by: As directed    Call MD for:  redness, tenderness, or signs of infection (pain, swelling, redness, odor or green/yellow discharge around incision site)   Complete by: As directed    Call MD for:  severe uncontrolled pain   Complete by: As directed    Call MD for:  temperature >100.4   Complete by: As directed        Signed: Orvis Brill, MD 03/03/2021, 2:32 PM    Pager: [phone]

## 2021-03-03 NOTE — ED Notes (Addendum)
Discharge order placed this morning but disposition was never set to discharge. Verified with Dr. Lorin Glass that pt is ready for discharge

## 2021-03-03 NOTE — Discharge Instructions (Addendum)
Patient was seen here for shaking spells.  These resolved when patient was in the hospital, and she did not have any other episodes while she was here.  Because she had elevated blood pressures at discharge, I resumed her home amlodipine 2.5 mg daily today.  Also started her on low-dose (10 g) lactulose daily for high ammonia levels noted on labs here.  Please attend your GI, hematology, and cardiology follow-ups in the outpatient setting.  Please follow-up with your primary care provider in the next week to discuss your recent hospitalization.  If any symptoms change or worsen acutely, please return to the nearest emergency department.

## 2021-03-03 NOTE — ED Notes (Signed)
PTAR at facility for tranport

## 2021-03-03 NOTE — ED Notes (Signed)
Pt care taken, no complaints at this time. 

## 2021-03-03 NOTE — Progress Notes (Signed)
TOC CSW spoke with pt's daughter Huey Romans, [phone], she stated she does agree with the recommendations of sending pt back to [facility]. CSW attempted to contact [facility] admission director to inquire if pt can return today, no response left VM. TOC continue to follow.   Arlie [email or web address] Fujimoto, MSW, [place]   Transitions of Care Clinical Social Worker I Direct Dial: [phone]   Fax: [phone] [email or web address]

## 2021-03-03 NOTE — ED Notes (Signed)
Attempted to call reports to [name]. Told they would call back

## 2021-03-03 NOTE — Progress Notes (Signed)
CSW spoke with [facility] RN at [employer] and explained that because Pt was held under observation and not admitted to hospital Pt can be sent back to facility without a new FL2. Facility RN in agreement.

## 2021-03-03 NOTE — ED Notes (Signed)
Discharge instructions discussed with pt. Pt verbalized understanding. Pt stable and ambulatory.  °

## 2021-03-03 NOTE — Evaluation (Signed)
Occupational Therapy Evaluation Patient Details Name: [name] MRN: [id number] DOB: 09-Jun-1940 Today's Date: 03/03/2021   History of Present Illness Pt is a 81 y/o female admitted 1/22 for shaking spells in setting of transient AMS. PMH includes: CHF, HTN, anemia, PNA, DM2, chronic GI bleeding, CKD stage 3, dementia.   Clinical Impression   PTA patient reports needing assist for ADLs, using RW for mobility with assist (wc when fatigued)- questionable historian due to hx of dementia. Pt from SNF and admitted for above, limited by weakness, decreased activity tolerance, and impaired balance.  Pt currently requires min assist +2 for transfers, mod assist +2 for side stepping to [facility] and up to total assist for ADLs.  She is pleasantly confused, oriented to person and place but not situation. She follows simple commands, but demonstrates poor awareness.  Recommend OT acutely and after dc at SNF level to decrease burden of care. Will follow.      Recommendations for follow up therapy are one component of a multi-disciplinary discharge planning process, led by the attending physician.  Recommendations may be updated based on patient status, additional functional criteria and insurance authorization.   Follow Up Recommendations  Skilled nursing-short term rehab (<3 hours/day)    Assistance Recommended at Discharge Frequent or constant Supervision/Assistance  Patient can return home with the following A lot of help with walking and/or transfers;A lot of help with bathing/dressing/bathroom    Functional Status Assessment  Patient has had a recent decline in their functional status and demonstrates the ability to make significant improvements in function in a reasonable and predictable amount of time.  Equipment Recommendations  None recommended by OT    Recommendations for Other Services       Precautions / Restrictions Precautions Precautions: Fall Restrictions Weight Bearing  Restrictions: No      Mobility Bed Mobility Overal bed mobility: Needs Assistance Bed Mobility: Supine to Sit, Sit to Supine     Supine to sit: Min assist Sit to supine: Max assist, +2 for physical assistance, +2 for safety/equipment   General bed mobility comments: min for trunk to ascend, max +2 to return back to bed    Transfers Overall transfer level: Needs assistance Equipment used: 2 person hand held assist Transfers: Sit to/from Stand Sit to Stand: Min assist, +2 physical assistance, +2 safety/equipment           General transfer comment: min +2 to power up and steady from EOB, B hand held support      Balance Overall balance assessment: Needs assistance Sitting-balance support: No upper extremity supported, Feet supported Sitting balance-Leahy Scale: Fair     Standing balance support: Bilateral upper extremity supported, During functional activity Standing balance-Leahy Scale: Poor                             ADL either performed or assessed with clinical judgement   ADL Overall ADL's : Needs assistance/impaired     Grooming: Wash/dry hands;Set up;Sitting           Upper Body Dressing : Moderate assistance;Sitting   Lower Body Dressing: Total assistance;+2 for physical assistance;+2 for safety/equipment;Sit to/from Health and safety inspector Details (indicate cue type and reason): unable, side stepping to [facility] with mod assist +2 Toileting- Clothing Manipulation and Hygiene: +2 for physical assistance;+2 for safety/equipment;Total assistance       Functional mobility during ADLs: Moderate assistance;+2 for physical assistance;+2 for safety/equipment General  ADL Comments: mod+2 to side step to [place] Assessment?: No apparent visual deficits     Perception     Praxis      Pertinent Vitals/Pain Pain Assessment Pain Assessment: No/denies pain     Hand Dominance Right   Extremity/Trunk Assessment Upper  Extremity Assessment Upper Extremity Assessment: Generalized weakness (Limited R shoulder ROM)   Lower Extremity Assessment Lower Extremity Assessment: Defer to PT evaluation   Cervical / Trunk Assessment Cervical / Trunk Assessment: Kyphotic   Communication Communication Communication: No difficulties   Cognition Arousal/Alertness: Awake/alert Behavior During Therapy: Flat affect Overall Cognitive Status: History of cognitive impairments - at baseline                                 General Comments: hx of dementia. Pleasant and following commands.  Oriented to self, place but not situation.  Poor awareness.     General Comments  VSS on RA    Exercises     Shoulder Instructions      Home Living Family/patient expects to be discharged to:: Skilled nursing facility                                        Prior Functioning/Environment Prior Level of Function : Needs assist             Mobility Comments: Uses RW for gait and transfers. Has WC if needed. Needs assist with mobility tasks ADLs Comments: Needs assist with bathing/dressing        OT Problem List: Decreased strength;Decreased activity tolerance;Impaired balance (sitting and/or standing);Decreased safety awareness;Decreased knowledge of use of DME or AE;Decreased knowledge of precautions      OT Treatment/Interventions: Self-care/ADL training;Therapeutic exercise;Therapeutic activities;Patient/family education;Balance training    OT Goals(Current goals can be found in the care plan section) Acute Rehab OT Goals Patient Stated Goal: none stated OT Goal Formulation: With patient Time For Goal Achievement: 03/17/21 Potential to Achieve Goals: Good  OT Frequency: Min 2X/week    Co-evaluation PT/OT/SLP Co-Evaluation/Treatment: Yes Reason for Co-Treatment: Complexity of the patient's impairments (multi-system involvement);Necessary to address cognition/behavior during  functional activity;To address functional/ADL transfers;For patient/therapist safety PT goals addressed during session: Mobility/safety with mobility;Balance OT goals addressed during session: ADL's and self-care      AM-PAC OT "6 Clicks" Daily Activity     Outcome Measure Help from another person eating meals?: A [place] Help from another person taking care of personal grooming?: A [place] Help from another person toileting, which includes using toliet, bedpan, or urinal?: Total Help from another person bathing (including washing, rinsing, drying)?: A Lot Help from another person to put on and taking off regular upper body clothing?: A Lot Help from another person to put on and taking off regular lower body clothing?: Total 6 Click Score: 12   End of Session Equipment Utilized During Treatment: Gait belt Nurse Communication: Mobility status  Activity Tolerance: Patient tolerated treatment well Patient left: in bed;with call bell/phone within reach;with nursing/sitter in room  OT Visit Diagnosis: Unsteadiness on feet (R26.81);Muscle weakness (generalized) (M62.81)                Time: 4481-8563 OT Time Calculation (min): 19 min Charges:  OT General Charges $OT Visit: 1 Visit OT Evaluation $OT Eval Moderate Complexity: 1 Mod  Jolaine Artist, OT Acute Rehabilitation Services Pager [phone] Office [phone]   Delight Stare 03/03/2021, 10:17 AM

## 2021-03-03 NOTE — Progress Notes (Incomplete)
HD#0 SUBJECTIVE:  Patient Summary: [name] is a 81 y.o. with a pertinent PMH of ***, who presented with *** and admitted for ***.   Overnight Events: ***  Interim History: Patient assessed at bedside this AM. She states that she feels ok this morning. She is unsure of why she came to the hospital. She denies shaking overnight or being cold. No other concerns at this time.  OBJECTIVE:  Vital Signs: Vitals:   03/03/21 0400 03/03/21 0500 03/03/21 0600 03/03/21 0700  BP: (!) 130/48 (!) 125/46 (!) 120/59 (!) 112/55  Pulse: (!) 59 (!) 58 (!) 58 (!) 59  Resp: 16 16 16 18   Temp:      TempSrc:      SpO2: 96% 96% 96% 95%  Weight:      Height:       Supplemental O2: {NAMES:3044014::"Room Air","Nasal Cannula","Simple Face Mask","Partial Rebreather","HFNC","Non Rebreather","Venturi Mask","Bag Valve Mask"} SpO2: 95 %  Filed Weights   03/02/21 2052  Weight: 62.8 kg    No intake or output data in the 24 hours ending 03/03/21 0734 Net IO Since Admission: No IO data has been entered for this period [03/03/21 0734]  Physical Exam: Constitutional: well-appearing *** sitting in ***, in no acute distress HENT: normocephalic atraumatic, mucous membranes moist Eyes: conjunctiva non-erythematous Neck: supple, JVD?? Cardiovascular: ****** rate and rhythm, no m/r/g Pulmonary/Chest: **** work of breathing on room air, lungs clear to auscultation bilaterally Abdominal: soft, non-tender, non-distended MSK: normal bulk and tone Neurological: alert & oriented x 3, 5/5 strength in bilateral upper and lower extremities, normal gait Skin: warm and dry Psych: ***  Patient Lines/Drains/Airways Status     Active Line/Drains/Airways     Name Placement date Placement time Site Days   Implanted Port 04/13/18 Right Chest 04/13/18  0855  Chest  1055   Implanted Port Right Chest --  --  Chest  --   External Urinary Catheter 02/12/21  1036  --  19   Incision (Closed) 02/05/21 Groin Right 02/05/21   1013  -- 26            Pertinent Labs: CBC Latest Ref Rng & Units 03/03/2021 03/02/2021 02/19/2021  WBC 4.0 - 10.5 K/uL 5.3 5.9 4.7  Hemoglobin 12.0 - 15.0 g/dL 10.2(L) 11.2(L) 9.1(L)  Hematocrit 36.0 - 46.0 % 31.8(L) 34.8(L) 28.8(L)  Platelets 150 - 400 K/uL 156 139(L) 199    CMP Latest Ref Rng & Units 03/03/2021 03/02/2021 02/19/2021  Glucose 70 - 99 mg/dL 202(H) 252(H) 131(H)  BUN 8 - 23 mg/dL 9 9 9   Creatinine 0.44 - 1.00 mg/dL 1.23(H) 1.35(H) 1.13(H)  Sodium 135 - 145 mmol/L 143 143 145  Potassium 3.5 - 5.1 mmol/L 3.4(L) 3.6 4.1  Chloride 98 - 111 mmol/L 113(H) 108 113(H)  CO2 22 - 32 mmol/L 24 22 28   Calcium 8.9 - 10.3 mg/dL 8.8(L) 9.1 8.7(L)  Total Protein 6.5 - 8.1 g/dL 4.9(L) 5.7(L) 5.1(L)  Total Bilirubin 0.3 - 1.2 mg/dL 1.2 1.0 0.5  Alkaline Phos 38 - 126 U/L 97 100 86  AST 15 - 41 U/L 33 30 25  ALT 0 - 44 U/L 15 14 11     Recent Labs    03/03/21 0115  GLUCAP 193*     Pertinent Imaging: DG Chest 2 View  Result Date: 03/02/2021 CLINICAL DATA:  Shaking altered mental status EXAM: CHEST - 2 VIEW COMPARISON:  02/03/2021, CT 02/05/2021 FINDINGS: Right-sided central venous port tip over the SVC. Cardiomegaly with mild central congestion.  Left upper lobe nodular opacity. Aortic atherosclerosis. No pneumothorax. IMPRESSION: 1. Cardiomegaly with mild central congestion. 2. Vague left upper lobe nodular consolidation corresponding to the CT abnormality. This appears slightly smaller radiographically as compared with 02/03/2021. Continued follow-up to resolution is recommended. Electronically Signed   By: Donavan Foil M.D.   On: 03/02/2021 22:20    ASSESSMENT/PLAN:  Assessment: Principal Problem:   Altered mental status Active Problems:   HHT (hereditary hemorrhagic telangiectasia) ([name]   Type 2 diabetes mellitus without complication ([name]   Chronic diastolic CHF (congestive heart failure) ([name]   Acute kidney injury ([name]   Hyperammonemia ([name]   HTN (hypertension),  benign   PAF (paroxysmal atrial fibrillation) ([name]   [name] is a 81 y.o. with a pertinent PMH of ***, who presented with *** and admitted for ***.   Plan: #*** ***  #*** ***  #*** ***  Best Practice: Diet: {CHL DISCHARGE DIET:21201} IVF: Fluids: {Meds; iv fluids:31617}, Rate: {NAMES:3044014::"None","*** cc/hr x *** hrs","*** cc bolus"} VTE: enoxaparin (LOVENOX) injection 30 mg Start: 03/03/21 0800 Code: {NAMES:3044014::"Full","DNR","DNI","DNR/DNI","Comfort Care","Unknown"} AB: *** Therapy Recs: {NAMES:3044014::"None","Pending","CIR","SNF","ALF","LTAC","Home Health"}, DME: {Assistive Devices ZJQ:96438} Family Contact: ***, {Family:304960261::"to be notified."} DISPO: Anticipated discharge {NAMES:3044014::"today","tomorrow","in *** days"} to {Discharge Destination:18313::"Home"} pending {BARRIERS TO DISCHARGE:24135}.  Signature: Daleen Bo. Luciana Cammarata, D.O.  Internal Medicine Resident, PGY-1 [name]  Pager: [phone] 7:34 AM, 03/03/2021   Please contact the on call pager after 5 pm and on weekends at [phone].

## 2021-03-03 NOTE — ED Notes (Signed)
Attempted to call report to 80M x2.

## 2021-03-03 NOTE — ED Notes (Signed)
[facility] called to transport patient, she is 8th on the list

## 2021-03-03 NOTE — Evaluation (Signed)
Physical Therapy Evaluation Patient Details Name: [name] MRN: [id number] DOB: 03-Oct-1940 Today's Date: 03/03/2021  History of Present Illness  Pt is a 81 y/o female admitted 1/22 for shaking spells in setting of transient AMS. PMH includes: CHF, HTN, anemia, PNA, DM2, chronic GI bleeding, CKD stage 3, dementia.  Clinical Impression  Pt admitted secondary to problem above with deficits below. Requiring min A +2 to stand and mod A +2 to attempt side steps. Pt with difficulty taking steps and requiring max multimodal cues throughout. Pt from SNF and recommend return at d/c to address current deficits. Will continue to follow acutely.        Recommendations for follow up therapy are one component of a multi-disciplinary discharge planning process, led by the attending physician.  Recommendations may be updated based on patient status, additional functional criteria and insurance authorization.  Follow Up Recommendations Skilled nursing-short term rehab (<3 hours/day)    Assistance Recommended at Discharge Frequent or constant Supervision/Assistance  Patient can return home with the following  A lot of help with walking and/or transfers;A lot of help with bathing/dressing/bathroom    Equipment Recommendations None recommended by PT  Recommendations for Other Services       Functional Status Assessment Patient has had a recent decline in their functional status and demonstrates the ability to make significant improvements in function in a reasonable and predictable amount of time.     Precautions / Restrictions Precautions Precautions: Fall Restrictions Weight Bearing Restrictions: No      Mobility  Bed Mobility Overal bed mobility: Needs Assistance Bed Mobility: Supine to Sit, Sit to Supine     Supine to sit: Min assist Sit to supine: Max assist, +2 for physical assistance, +2 for safety/equipment   General bed mobility comments: min for trunk to ascend, max +2 to return  back to higher stretcher    Transfers Overall transfer level: Needs assistance Equipment used: 2 person hand held assist Transfers: Sit to/from Stand Sit to Stand: Min assist, +2 physical assistance, +2 safety/equipment           General transfer comment: min +2 to power up and steady from EOB, B hand held support    Ambulation/Gait Ambulation/Gait assistance: Mod assist, +2 physical assistance   Assistive device: 2 person hand held assist         General Gait Details: Pt with increased difficulty taking side steps. Required mod A +2 and max multimodal cues.  Stairs            Wheelchair Mobility    Modified Rankin (Stroke Patients Only)       Balance Overall balance assessment: Needs assistance Sitting-balance support: No upper extremity supported, Feet supported Sitting balance-Leahy Scale: Fair     Standing balance support: Bilateral upper extremity supported, During functional activity Standing balance-Leahy Scale: Poor Standing balance comment: Reliant on BUE and external support                             Pertinent Vitals/Pain Pain Assessment Pain Assessment: No/denies pain    Home Living Family/patient expects to be discharged to:: Skilled nursing facility                        Prior Function Prior Level of Function : Needs assist;Patient poor historian/Family not available             Mobility Comments: Uses RW for gait  and transfers. Has WC if needed. Needs assist with mobility tasks ADLs Comments: Needs assist with bathing/dressing     Hand Dominance   Dominant Hand: Right    Extremity/Trunk Assessment   Upper Extremity Assessment Upper Extremity Assessment: Defer to OT evaluation    Lower Extremity Assessment Lower Extremity Assessment: Generalized weakness (Decreased ROM into knee extension bilaterally)    Cervical / Trunk Assessment Cervical / Trunk Assessment: Kyphotic  Communication    Communication: No difficulties  Cognition Arousal/Alertness: Awake/alert Behavior During Therapy: Flat affect Overall Cognitive Status: History of cognitive impairments - at baseline                                 General Comments: hx of dementia. Pleasant and following commands.  Oriented to self, place but not situation.  Poor awareness.        General Comments General comments (skin integrity, edema, etc.): VSS on RA    Exercises     Assessment/Plan    PT Assessment Patient needs continued PT services  PT Problem List Decreased strength;Decreased activity tolerance;Decreased balance;Decreased range of motion;Decreased mobility;Decreased knowledge of use of DME;Decreased knowledge of precautions       PT Treatment Interventions DME instruction;Functional mobility training;Balance training;Patient/family education;Gait training;Therapeutic activities;Therapeutic exercise;Cognitive remediation    PT Goals (Current goals can be found in the Care Plan section)  Acute Rehab PT Goals Patient Stated Goal: go home PT Goal Formulation: With patient Time For Goal Achievement: 03/17/21 Potential to Achieve Goals: Fair    Frequency Min 2X/week     Co-evaluation PT/OT/SLP Co-Evaluation/Treatment: Yes Reason for Co-Treatment: Complexity of the patient's impairments (multi-system involvement);Necessary to address cognition/behavior during functional activity;To address functional/ADL transfers;For patient/therapist safety PT goals addressed during session: Mobility/safety with mobility;Balance OT goals addressed during session: ADL's and self-care       AM-PAC PT "6 Clicks" Mobility  Outcome Measure Help needed turning from your back to your side while in a flat bed without using bedrails?: A [place] Help needed moving from lying on your back to sitting on the side of a flat bed without using bedrails?: A Lot Help needed moving to and from a bed to a chair (including  a wheelchair)?: Total Help needed standing up from a chair using your arms (e.g., wheelchair or bedside chair)?: Total Help needed to walk in hospital room?: Total Help needed climbing 3-5 steps with a railing? : Total 6 Click Score: 9    End of Session Equipment Utilized During Treatment: Gait belt Activity Tolerance: Patient tolerated treatment well Patient left: in bed;with call bell/phone within reach (on stretcher in ED) Nurse Communication: Mobility status PT Visit Diagnosis: Unsteadiness on feet (R26.81);Muscle weakness (generalized) (M62.81);Difficulty in walking, not elsewhere classified (R26.2)    Time: 1017-5102 PT Time Calculation (min) (ACUTE ONLY): 20 min   Charges:   PT Evaluation $PT Eval Moderate Complexity: 1 Mod          Reuel Derby, PT, DPT  Acute Rehabilitation Services  Pager: [phone] Office: [phone]   Rudean Hitt 03/03/2021, 10:25 AM

## 2021-03-03 NOTE — ED Notes (Signed)
Breakfast order placed ?

## 2021-03-03 NOTE — Care Management Obs Status (Signed)
[place] NOTIFICATION   Patient Details  Name: [name] MRN: [id number] Date of Birth: 30-Apr-1940   Medicare Observation Status Notification Given:  Yes    Fuller Mandril, RN 03/03/2021, 2:46 PM

## 2021-03-04 NOTE — Progress Notes (Signed)
Office Visit    Patient Name: [name] Date of Encounter: 03/05/2021  Primary Care Provider:  Mike Craze, DO Primary Cardiologist:  Fransico Him, MD  Chief Complaint   81 year old female with a history of chronic diastolic heart failure, moderate pulmonary hypertension, hypertension, hyperlipidemia, Osler-Weber-Rendu's disease, seizures, gastric AVMs, splenic mass, chronic hyperammonemia, chronic iron deficiency anemia requiring frequent transfusions, CKD stage IIIa, GERD, and type 2 diabetes who presents for follow-up related to heart failure and hypertension.  Past Medical History    Past Medical History:  Diagnosis Date   Chronic anemia    Chronic diastolic CHF (congestive heart failure) ([place]) 10/03/2013   Chronic GI bleeding    /notes 11/29/2014   Family history of anesthesia complication    "niece has a hard time coming out" (09/15/2012)   Frequent nosebleeds    chronic   Gastric AV malformation    /notes 11/29/2014   GERD (gastroesophageal reflux disease)    Heart murmur 04/01/2017   Moderate AVSC on echo 09/2016   History of blood transfusion "several"   History of epistaxis    HTN (hypertension), benign 03/02/2012   Hyperlipidemia    Iron deficiency anemia    chronic infusions"   Lichen planus    Both lower extremities   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[place])    [name] 11/29/2014   Overgrown toenails 03/18/2017   Pneumonia 1990's X 2   Pulmonary HTN ([place]) 04/01/2017   PASP 69mmHg on echo 09/2016 and 45mmHg by echo 2019   Seizures ([place]) 09/2014   Symptomatic anemia 11/29/2014   Telangiectasia    Gastric    Type II diabetes mellitus ([place]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   Past Surgical History:  Procedure Laterality Date   CATARACT EXTRACTION     "I think it was just one eye"   ESOPHAGOGASTRODUODENOSCOPY  02/26/2011   Procedure: ESOPHAGOGASTRODUODENOSCOPY (EGD);  Surgeon: [name], MD;  Location[name];  Service: Endoscopy;  Laterality: N/A;    ESOPHAGOGASTRODUODENOSCOPY N/A 11/08/2012   Procedure: ESOPHAGOGASTRODUODENOSCOPY (EGD);  Surgeon: [name], MD;  Location: [name];  Service: Endoscopy;  Laterality: N/A;   ESOPHAGOGASTRODUODENOSCOPY N/A 10/04/2013   Procedure: ESOPHAGOGASTRODUODENOSCOPY (EGD);  Surgeon: Winfield Cunas., MD;  Location: [name];  Service: Endoscopy;  Laterality: N/A;  with APC on stand-by   ESOPHAGOGASTRODUODENOSCOPY N/A 07/06/2014   Procedure: ESOPHAGOGASTRODUODENOSCOPY (EGD);  Surgeon: Clarene Essex, MD;  Location: [name];  Service: Endoscopy;  Laterality: N/A;   ESOPHAGOGASTRODUODENOSCOPY N/A 09/05/2014   Procedure: ESOPHAGOGASTRODUODENOSCOPY (EGD);  Surgeon: Laurence Spates, MD;  Location: [name];  Service: Endoscopy;  Laterality: N/A;  APC on standby to control bleeding   ESOPHAGOGASTRODUODENOSCOPY N/A 11/29/2014   Procedure: ESOPHAGOGASTRODUODENOSCOPY (EGD);  Surgeon: [name], MD;  Location: [facility] ENDOSCOPY;  Service: Endoscopy;  Laterality: N/A;   ESOPHAGOGASTRODUODENOSCOPY N/A 09/28/2015   Procedure: ESOPHAGOGASTRODUODENOSCOPY (EGD);  Surgeon: Clarene Essex, MD;  Location: [facility] ENDOSCOPY;  Service: Endoscopy;  Laterality: N/A;   ESOPHAGOGASTRODUODENOSCOPY (EGD) WITH PROPOFOL N/A 12/04/2017   Procedure: ESOPHAGOGASTRODUODENOSCOPY (EGD) WITH PROPOFOL;  Surgeon: [name], MD;  Location: [place];  Service: Endoscopy;  Laterality: N/A;   ESOPHAGOGASTRODUODENOSCOPY (EGD) WITH PROPOFOL N/A 12/24/2020   Procedure: ESOPHAGOGASTRODUODENOSCOPY (EGD) WITH PROPOFOL;  Surgeon: Arta Silence, MD;  Location: [place];  Service: Endoscopy;  Laterality: N/A;   ESOPHAGOGASTRODUODENOSCOPY (EGD) WITH PROPOFOL N/A 01/19/2021   Procedure: ESOPHAGOGASTRODUODENOSCOPY (EGD) WITH PROPOFOL;  Surgeon: [name], MD;  Location: [place];  Service: Endoscopy;  Laterality: N/A;   ESOPHAGOGASTRODUODENOSCOPY (EGD)  WITH PROPOFOL N/A 02/06/2021   Procedure: ESOPHAGOGASTRODUODENOSCOPY  (EGD) WITH PROPOFOL;  Surgeon: Ronnette Juniper, MD;  Location: [place];  Service: Gastroenterology;  Laterality: N/A;   ESOPHAGOGASTRODUODENOSCOPY ENDOSCOPY  08/19/2006   with laser treatment   HEMOSTASIS CLIP PLACEMENT  12/24/2020   Procedure: HEMOSTASIS CLIP PLACEMENT;  Surgeon: Arta Silence, MD;  Location: [facility] ENDOSCOPY;  Service: Endoscopy;;   HEMOSTASIS CLIP PLACEMENT  01/19/2021   Procedure: HEMOSTASIS CLIP PLACEMENT;  Surgeon: [name], MD;  Location: [place];  Service: Endoscopy;;   HOT HEMOSTASIS N/A 07/06/2014   Procedure: HOT HEMOSTASIS (ARGON PLASMA COAGULATION/BICAP);  Surgeon: Clarene Essex, MD;  Location: [name];  Service: Endoscopy;  Laterality: N/A;   HOT HEMOSTASIS N/A 09/28/2015   Procedure: HOT HEMOSTASIS (ARGON PLASMA COAGULATION/BICAP);  Surgeon: Clarene Essex, MD;  Location: [facility] ENDOSCOPY;  Service: Endoscopy;  Laterality: N/A;   HOT HEMOSTASIS N/A 12/04/2017   Procedure: HOT HEMOSTASIS (ARGON PLASMA COAGULATION/BICAP);  Surgeon: [name], MD;  Location: [place];  Service: Endoscopy;  Laterality: N/A;   IR ANGIOGRAM SELECTIVE EACH ADDITIONAL VESSEL  2021/02/08   IR ANGIOGRAM VISCERAL SELECTIVE  02/08/21   IR EMBO ART  VEN HEMORR LYMPH EXTRAV  INC GUIDE ROADMAPPING  2021/02/08   IR US GUIDE VASC ACCESS RIGHT  02-08-2021   NASAL HEMORRHAGE CONTROL     "for bleeding"    SAVORY DILATION  02/26/2011   Procedure: SAVORY DILATION;  Surgeon: [name], MD;  Location: [name];  Service: Endoscopy;  Laterality: N/A;  c-arm needed   SCLEROTHERAPY  12/24/2020   Procedure: SCLEROTHERAPY;  Surgeon: Arta Silence, MD;  Location: [facility] ENDOSCOPY;  Service: Endoscopy;;   SCLEROTHERAPY  01/19/2021   Procedure: Clide Deutscher;  Surgeon: [name], MD;  Location: [facility] ENDOSCOPY;  Service: Endoscopy;;   SUBMUCOSAL INJECTION  12/04/2017   Procedure: SUBMUCOSAL INJECTION;  Surgeon: [name], MD;  Location: [facility] ENDOSCOPY;  Service: Endoscopy;;     Allergies  Allergies  Allergen Reactions   Aspirin Nausea And Vomiting    History of Present Illness   81 year old female with the above past medical history including chronic diastolic heart failure, moderate pulmonary hypertension, hypertension, hyperlipidemia, Osler-Weber-Rendu's disease,  seizures, gastric AVMs, splenic mass, chronic hyperammonemia, chronic iron deficiency anemia requiring frequent transfusions, CKD stage IIIa, GERD, and type 2 diabetes.  Echocardiogram in 2019 showed normal LVEF, mild LVH, G2 DD, moderate pulmonary hypertension, PASP of 57 mmHg.  Most recent echocardiogram in August 2022 during hospitalization for acute on chronic diastolic heart failure showed an EF 60 to 75%, normal LV function, mild LVH, G2 DD, stable moderate pulmonary hypertension, severe LAE.  She was last seen in the office in July 2020 and was stable overall from a cardiac standpoint.  She has been evaluated multiple times in the ED for altered mental status often in the setting of AKI, chronically elevated ammonia levels. She was hospitalized from 02/03/2021 to 02/12/2021 in the setting of acute upper GI bleed complicated by hemorrhagic shock s/p IR embolization. She has a history of hereditary hemorrhagic telangiectasia syndrome.  MRI on 02/04/2021 showed signs of iron deposits in liver spleen and space. Following with hematology.  Ultrasound on 08-Feb-2021 showed pronounced echogenicity in the right hepatic lobe, follow-up MRI/CT showed no evidence of metastatic disease, follow-up with GI was recommended. CT also showed splenic mass as well as left upper lobe consolidation favoring focal pneumonia over neoplasm with recommended follow-up imaging in 6 to 8 weeks to assess further. Her torsemide and amlodipine were held at the  time due to hypotension.  She was discharged to SNF. She was most recently evaluated in the ED on 1/23 /2023 setting of concern for seizure activity, altered mental status. Her  ammonia was elevated. There was low suspicion for seizure presentation given maintenance oral Keppra. Her mental status returned to baseline and was discharged to SNF in stable condition.   She presents today for follow-up accompanied by her husband.  Since her recent hospitalization and ED visit she reports feeling better overall.  She denies any shortness of breath, edema, symptoms concerning for angina.  She is working with PT at her facility and is eager to get stronger so that she can go home.  Overall, she reports feeling better and denies any specific concerns or complaints today. Home Medications    Current Outpatient Medications  Medication Sig Dispense Refill   ACCU-CHEK GUIDE test strip USE AS INSTRUCTED TO CHECK BLOOD SUGAR 3 TIMES DAILY. (Patient taking differently: 1 each by Other route in the morning, at noon, and at bedtime.) 100 each 0   Accu-Chek Softclix Lancets lancets 1 each by Other route 3 (three) times daily. Use as instructed to check blood sugar 3 times per day dx code E11.65 100 each 3   acetaminophen (TYLENOL) 325 MG tablet Take 650 mg by mouth every 4 (four) hours as needed for moderate pain.     amLODipine (NORVASC) 2.5 MG tablet Take 1 tablet (2.5 mg total) by mouth daily. 30 tablet 11   Continuous Blood Gluc Sensor (FREESTYLE LIBRE 2 SENSOR) MISC 2 Devices by Does not apply route every 14 (fourteen) days. 2 each 3   diclofenac Sodium (VOLTAREN) 1 % GEL Apply 2 g topically 4 (four) times daily. (Patient taking differently: Apply 2 g topically 4 (four) times daily. shoulder) 100 g 1   Dulaglutide (TRULICITY) 1.5 BS/4.9QP SOPN Inject 1.5 mg into the skin every Thursday.     gabapentin (NEURONTIN) 100 MG capsule Take 1 capsule (100 mg total) by mouth 2 (two) times daily.     insulin lispro (HUMALOG KWIKPEN) 100 UNIT/ML KwikPen Inject 2 Units into the skin 3 (three) times daily.     Insulin Lispro Prot & Lispro (HUMALOG MIX 75/25 KWIKPEN) (75-25) 100 UNIT/ML Kwikpen Inject  12 Units into the skin every morning.     lactulose (CEPHULAC) 10 g packet Take 1 packet (10 g total) by mouth daily. 30 each 0   levETIRAcetam (KEPPRA) 500 MG tablet Take 1 tablet (500 mg total) by mouth 2 (two) times daily.     memantine (NAMENDA) 10 MG tablet TAKE 1 TABLET BY MOUTH TWICE DAILY (Patient taking differently: Take 10 mg by mouth 2 (two) times daily.) 60 tablet 10   Misc Natural Products (IMMUNE FORMULA PO) Take 1 tablet by mouth daily.     Multiple Vitamins-Minerals (MULTI FOR HER 50+) TABS Take 1 tablet by mouth daily with breakfast.     NON FORMULARY See admin instructions. Compression Stockings apply in the morning and remove at bedtime     pantoprazole (PROTONIX) 40 MG tablet Take 1 tablet (40 mg total) by mouth 2 (two) times daily. 60 tablet 2   polyethylene glycol powder (GLYCOLAX/MIRALAX) 17 GM/SCOOP powder Take 17 g by mouth daily as needed for mild constipation.     torsemide (DEMADEX) 20 MG tablet Take 1 tablet (20 mg total) by mouth as needed (please take 1 20mg  tablet as needed for weight gain of 2 pounds overnight or 5 pounds in a week). 180 tablet  3   triamcinolone cream (KENALOG) 0.1 % Apply 1 application topically 2 (two) times daily as needed (itching).     triamcinolone ointment (KENALOG) 0.5 % APPLY 1 APPLICATION TOPICALLY TO RASH TWICE DAILY FOR ITCHING (Patient taking differently: Apply 1 application topically 2 (two) times daily as needed (itching).) 30 g 10   No current facility-administered medications for this visit.   Facility-Administered Medications Ordered in Other Visits  Medication Dose Route Frequency Provider Last Rate Last Admin   sodium chloride 0.9 % injection 10 mL  10 mL Intracatheter PRN Truitt Merle, MD       sodium chloride flush (NS) 0.9 % injection 10 mL  10 mL Intracatheter PRN Truitt Merle, MD   10 mL at 08/18/19 1717     Review of Systems    She denies chest pain, palpitations, dyspnea, pnd, orthopnea, n, v, dizziness, syncope, edema,  weight gain, or early satiety. All other systems reviewed and are otherwise negative except as noted above.   Physical Exam    VS:  BP (!) 142/62    Pulse 64    Ht 5\' 3"  (1.6 m)    LMP  (LMP Unknown)    SpO2 97%    BMI 24.53 kg/m  GEN: Well nourished, well developed, in no acute distress. HEENT: normal. Neck: Supple, no JVD, carotid bruits, or masses. Cardiac: RRR, 2/6 systolic murmur, loudest at upper sternal borders, no rubs, or gallops. No clubbing, cyanosis, edema.  Radials/DP/PT 2+ and equal bilaterally.  Respiratory:  Respirations regular and unlabored, clear to auscultation bilaterally. GI: Soft, nontender, nondistended, BS + x 4. MS: no deformity or atrophy. Skin: warm and dry, no rash. Neuro:  Strength and sensation are intact. Psych: Normal affect.  Accessory Clinical Findings    ECG personally reviewed by me today - Normal sinus rhythm, 64 bpm, LVH,  - no acute changes.  Lab Results  Component Value Date   WBC 5.3 03/03/2021   HGB 10.2 (L) 03/03/2021   HCT 31.8 (L) 03/03/2021   MCV 90.9 03/03/2021   PLT 156 03/03/2021   Lab Results  Component Value Date   CREATININE 1.23 (H) 03/03/2021   BUN 9 03/03/2021   NA 143 03/03/2021   K 3.4 (L) 03/03/2021   CL 113 (H) 03/03/2021   CO2 24 03/03/2021   Lab Results  Component Value Date   ALT 15 03/03/2021   AST 33 03/03/2021   GGT 174 (H) 08/30/2019   ALKPHOS 97 03/03/2021   BILITOT 1.2 03/03/2021   Lab Results  Component Value Date   CHOL 133 01/30/2019   HDL 41.30 01/30/2019   LDLCALC 57 01/30/2019   TRIG 176.0 (H) 01/30/2019   CHOLHDL 3 01/30/2019    Lab Results  Component Value Date   HGBA1C 6.2 (H) 02/05/2021    Assessment & Plan    1. Chronic diastolic heart failure/pulmonary hypertension: Most recent echo in August 2022 showed an EF 60 to 75%, normal LV function, mild LVH, G2 DD, stable moderate pulmonary hypertension, severe LAE, mild aortic valve stenosis. Euvolemic and well compensated on exam.  Will restart torsemide 20 mg daily as needed for lower extremity edema, weight gain of 3 pounds overnight or 5 pounds in a week.  Potassium was low, 3.4 most recent ED visit.  We will check BMET today and supplement as needed.   2. Mild aortic stenosis: Most recent echo as above.  She does have a murmur on exam today.  Continue to monitor for symptoms.  3. Hypertension: BP somewhat elevated in office today. She is taking amlodipine 2.5 mg daily. She was previously hypotensive during recent hospitalization.  Recommend she check blood pressures daily and report BP consistently >130/80.  If blood pressure remains elevated, could consider increasing amlodipine to 5 mg daily.  For now, continue current antihypertensive regimen.   4. Hyperlipidemia: Documented history of hyperlipidemia, no recent LDL on file.  We will check direct LDL today.  5. 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 Follow-up with hematology as planned.   6. Recent GI bleed/AMS/chronic hyperammonemia: Recent CBC stable. She denies any bleeding. Follow-up with GI as planned. She is on chronic lactulose. Mental status improved.  7. Disposition: Follow-up in 3 to 4 months.  Lenna Sciara, NP 03/05/2021, 5:07 PM

## 2021-03-05 ENCOUNTER — Ambulatory Visit (INDEPENDENT_AMBULATORY_CARE_PROVIDER_SITE_OTHER): Payer: Medicare Other | Admitting: Nurse Practitioner

## 2021-03-05 ENCOUNTER — Encounter (HOSPITAL_BASED_OUTPATIENT_CLINIC_OR_DEPARTMENT_OTHER): Payer: Self-pay | Admitting: Nurse Practitioner

## 2021-03-05 ENCOUNTER — Other Ambulatory Visit: Payer: Self-pay

## 2021-03-05 VITALS — BP 142/62 | HR 64 | Ht 63.0 in

## 2021-03-05 DIAGNOSIS — E7220 Disorder of urea cycle metabolism, unspecified: Secondary | ICD-10-CM

## 2021-03-05 DIAGNOSIS — E782 Mixed hyperlipidemia: Secondary | ICD-10-CM

## 2021-03-05 DIAGNOSIS — I5032 Chronic diastolic (congestive) heart failure: Secondary | ICD-10-CM

## 2021-03-05 DIAGNOSIS — I10 Essential (primary) hypertension: Secondary | ICD-10-CM

## 2021-03-05 DIAGNOSIS — K922 Gastrointestinal hemorrhage, unspecified: Secondary | ICD-10-CM

## 2021-03-05 DIAGNOSIS — I350 Nonrheumatic aortic (valve) stenosis: Secondary | ICD-10-CM | POA: Diagnosis not present

## 2021-03-05 DIAGNOSIS — I272 Pulmonary hypertension, unspecified: Secondary | ICD-10-CM | POA: Diagnosis not present

## 2021-03-05 DIAGNOSIS — Z8679 Personal history of other diseases of the circulatory system: Secondary | ICD-10-CM

## 2021-03-05 MED ORDER — TORSEMIDE 20 MG PO TABS: 20.0000 mg | ORAL_TABLET | ORAL | 3 refills | Status: DC | PRN

## 2021-03-05 NOTE — Patient Instructions (Signed)
Medication Instructions:  Your physician has recommended you make the following change in your medication:   Restart: Torsemide 20mg  as needed daily for a weight gain of 2lbs overnight or 5 pounds in a week!   Consider increasing amlodipine to 5mg  daily if blood pressures consistently remain greater than 130/80   *If you need a refill on your cardiac medications before your next appointment, please call your pharmacy*   Lab Work: Your physician recommends that you return for lab work Today- BMET, Direct LDL  If you have labs (blood work) drawn today and your tests are completely normal, you will receive your results only by: [place] (if you have MyChart) OR A paper copy in the mail If you have any lab test that is abnormal or we need to change your treatment, we will call you to review the results.   Testing/Procedures: None ordered today    Follow-Up: At [facility], you and your health needs are our priority.  As part of our continuing mission to provide you with exceptional heart care, we have created designated Provider Care Teams.  These Care Teams include your primary Cardiologist (physician) and Advanced Practice Providers (APPs -  Physician Assistants and Nurse Practitioners) who all work together to provide you with the care you need, when you need it.  We recommend signing up for the patient portal called "MyChart".  Sign up information is provided on this After Visit Summary.  MyChart is used to connect with patients for Virtual Visits (Telemedicine).  Patients are able to view lab/test results, encounter notes, upcoming appointments, etc.  Non-urgent messages can be sent to your provider as well.   To learn more about what you can do with MyChart, go to [email or web address].    Your next appointment:   3-4 month(s)  The format for your next appointment:   In Person  Provider:   Fransico Him, MD

## 2021-03-06 ENCOUNTER — Telehealth (HOSPITAL_BASED_OUTPATIENT_CLINIC_OR_DEPARTMENT_OTHER): Payer: Self-pay

## 2021-03-06 ENCOUNTER — Encounter: Payer: Self-pay | Admitting: Hematology

## 2021-03-06 LAB — LDL CHOLESTEROL, DIRECT: LDL Direct: 148 mg/dL — ABNORMAL HIGH (ref 0–99)

## 2021-03-06 LAB — BASIC METABOLIC PANEL
BUN/Creatinine Ratio: 8 — ABNORMAL LOW (ref 12–28)
BUN: 9 mg/dL (ref 8–27)
CO2: 24 mmol/L (ref 20–29)
Calcium: 9.1 mg/dL (ref 8.7–10.3)
Chloride: 109 mmol/L — ABNORMAL HIGH (ref 96–106)
Creatinine, Ser: 1.14 mg/dL — ABNORMAL HIGH (ref 0.57–1.00)
Glucose: 134 mg/dL — ABNORMAL HIGH (ref 70–99)
Potassium: 3.8 mmol/L (ref 3.5–5.2)
Sodium: 145 mmol/L — ABNORMAL HIGH (ref 134–144)
eGFR: 49 mL/min/{1.73_m2} — ABNORMAL LOW (ref >59)

## 2021-03-06 NOTE — Telephone Encounter (Addendum)
Resulted called to patient. No answer, left message with results and call back number if needed    ----- Message from Lenna Sciara, NP sent at 03/06/2021  5:40 AM EST ----- Yesterday's labs showed stable kidney function, potassium has returned to normal. Her LDL cholesterol was elevated, however, given her age, I would not start her on medication for this at this time. She can continue to eat a heart healthy diet as able. Otherwise, continue current medications and follow-up as planned. Thank you.

## 2021-03-12 ENCOUNTER — Non-Acute Institutional Stay: Payer: Medicare Other | Admitting: Hospice

## 2021-03-12 ENCOUNTER — Encounter: Payer: Self-pay | Admitting: Hematology

## 2021-03-12 ENCOUNTER — Other Ambulatory Visit: Payer: Self-pay

## 2021-03-12 DIAGNOSIS — I5032 Chronic diastolic (congestive) heart failure: Secondary | ICD-10-CM

## 2021-03-12 DIAGNOSIS — Z515 Encounter for palliative care: Secondary | ICD-10-CM

## 2021-03-12 DIAGNOSIS — E119 Type 2 diabetes mellitus without complications: Secondary | ICD-10-CM

## 2021-03-12 DIAGNOSIS — R269 Unspecified abnormalities of gait and mobility: Secondary | ICD-10-CM

## 2021-03-12 NOTE — Progress Notes (Signed)
[facility] Consult Note Telephone: [phone]  Fax: [phone]  PATIENT NAME: [name] [address] [place] [address] [phone] (home)  DOB: 04/13/1940 MRN: [id number]  PRIMARY CARE PROVIDER:    [name], Areeg N, DO,  [address] [place] [address] [phone]  REFERRING PROVIDER:  Lavone Neri, NP   RESPONSIBLE PARTY:   [name]/Denise Contact Information     Name Relation Home Work Mobile   [place] Daughter [phone]  [phone]   Shirlena, Brinegar [phone]  [phone]   [place],Denise Niece   [phone]   Lilla Shook [phone]          I met face to face with patient and at facility. Palliative Care was asked to follow this patient by consultation request of  Danie Binder NP to address advance care planning, complex medical decision making and goals of care clarification.  Denise joined visit via telephone. She provided history as patient is a poor historian due to cognitive deficits. She endorsed palliative service.  This is the initial visit.    ASSESSMENT AND / RECOMMENDATIONS:   Advance Care Planning: Our advance care planning conversation included a discussion about:    The value and importance of advance care planning  Difference between Hospice and Palliative care Exploration of goals of care in the event of a sudden injury or illness  Identification and preparation of a healthcare agent  Review and updating or creation of an  advance directive document . Decision not to resuscitate or to de-escalate disease focused treatments due to poor prognosis.  CODE STATUS: Patient is a Full code  Goals of Care: Goals include to maximize quality of life and symptom management  I spent 20  minutes providing this initial consultation. More than 50% of the time in this consultation was spent on counseling patient and coordinating  communication. --------------------------------------------------------------------------------------------------------------------------------------  Symptom Management/Plan: Dementia:  Impoverished thoughts, impaired judgement, word-finding difficulty. Encourage reminiscence, word search/puzzles, cueing for recollection.  Promote calm approach and engaging environment.  Safety and fall precautions.  Optimize ongoing supportive care. Continue  memantine as currently ordered.  Gait disturbance: Fall precautions in place.  Maintain safe physical activity as tolerated to maintain muscle mass.  Provide assistance as needed for transfers. Type 2 DM:  A1c 6.2 02/05/21. Continue Insulin as ordered. No concentrated sweets. Diabetic diet, no empty caloric beverages. Check CBG as ordered to help ensure tight control.  Routine A1c, CBC BMP CFH: Continue Torsemide as ordered. Elevate BLE during the day as much as possible to promote circulation. Adhere to Fluid and salt limits. Monitor weight closely and report weight gain of 3 Ibs in a day or 5 Ibs in a week  to eval for additional diuresis  Follow up: Palliative care will continue to follow for complex medical decision making, advance care planning, and clarification of goals. Return 6 weeks or prn. Encouraged to call provider sooner with any concerns.   Family /Caregiver/Community Supports: Patient is in SNF for ongoing care  HOSPICE ELIGIBILITY/DIAGNOSIS: TBD  Chief Complaint: Initial Palliative care visit  HISTORY OF PRESENT ILLNESS:  [name] is a 81 y.o. year old female  with multiple medical conditions including Dementia, progressive with associated memory loss, worsening in line with dementia disease trajectory; this affects patient's quality of life and independence, memory loss is worse as the day progresses; cues for recollection sometimes helpful.History of Type 2 DM. CHF, Anemia.  Patient denies pain/discomfort, nursing with no urgent  needs. History obtained from review of EMR, discussion with primary team, caregiver, family and/or Ms. Monk.  Review and summarization of Epic records shows history from other than patient. Rest of 10 point ROS asked and negative.  I reviewed as needed, available labs, patient records, imaging, studies and related documents from the EMR.  No results for input(s): WBC, HGB, HCT, PLT, MCV in the last 168 hours. Recent Labs  Lab 03/05/21 1433  NA 145*  K 3.8  CL 109*  CO2 24  BUN 9  CREATININE 1.14*  GLUCOSE 134*    ROS General: NAD EYES: denies vision changes ENMT: denies dysphagia Cardiovascular: denies chest pain/discomfort Pulmonary: denies cough, denies SOB Abdomen: endorses good appetite, denies constipation/diarrhea GU: denies dysuria, urinary frequency MSK:  endorses weakness,  no falls reported Skin: denies rashes or wounds Neurological: denies pain, denies insomnia Psych: Endorses positive mood Heme/lymph/immuno: denies bruises, abnormal bleeding  Physical Exam: Constitutional: NAD General: Well groomed, cooperative EYES: anicteric sclera, lids intact, no discharge  ENMT: Moist mucous membrane CV: S1 S2, RRR, no LE edema Pulmonary: LCTA, no increased work of breathing, no cough, Abdomen: active BS + 4 quadrants, soft and non tender GU: no suprapubic tenderness MSK: weakness, sarcopenia, limited ROM Skin: warm and dry, no rashes or wounds on visible skin Neuro:  weakness, otherwise non focal, memory loss/confusion Psych: non-anxious affect Hem/lymph/immuno: no widespread bruising   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   Fatigue 07/10/2011   Type 2 diabetes mellitus without complication ([employer]) 02/18/8123   Chronic diastolic CHF (congestive heart failure) ([facility]) 10/03/2013   Acute on chronic heart failure with preserved ejection  fraction (HFpEF) ([facility]) 10/03/2013   Acute kidney injury ([facility]) 08/27/2014   Hyperammonemia ([facility]) 10/03/2014   HTN (hypertension), benign 03/02/2012   CKD (chronic kidney disease), stage III ([name]    Simple febrile convulsions ([facility]) 10/21/2014   Memory loss 01/30/2015   Healthcare maintenance 06/20/2015   Seborrheic keratosis 57/32/2025   Lichen planus 42/70/6237   PAF (paroxysmal atrial fibrillation) ([facility]) 09/29/2015   Pain in joint of right shoulder 10/22/2015   Peripheral vascular disease ([facility]) 12/30/2015   Dry skin 02/17/2016   Anemia due to chronic blood loss    Venous insufficiency of both lower extremities 04/27/2016   Epistaxis 07/30/2016   Osteopenia after menopause 01/13/2017   Overgrown toenails 03/18/2017   Pulmonary HTN ([facility]) 04/01/2017   Heart murmur 04/01/2017   Lower extremity pain, bilateral 07/15/2017   Pain localized to upper abdomen 10/05/2017   Hematemesis 12/04/2017   Leg weakness, bilateral 12/23/2017   Hyperpigmented skin lesion 02/07/2018   Acute right hip pain 03/10/2018   Iron deficiency anemia due to chronic blood loss 04/19/2018   Port-A-Cath in place 05/23/2018   Hyperkalemia 10/18/2018   Neuropathy 07/27/2019   Seizures ([facility]) 09/2014   Hearing loss 05/29/2020   Bullous lesion 10/08/2020   GI bleed 01/18/2021   AKI (acute kidney injury) ([facility]) 01/28/2021   Altered mental status 02/03/2021   AMS (altered mental status) 02/04/2021   Resolved Ambulatory Problems    Diagnosis Date Noted   Type 2 diabetes mellitus with complication, with long-term current use of insulin ([facility]) 04/08/2006   HYPERLIPIDEMIA 04/08/2006   Anemia, iron deficiency 04/21/2006   Iron deficiency anemia due to chronic blood loss 04/08/2006   HYPERTENSION, BENIGN SYSTEMIC 04/08/2006   SYMPTOM, EPISTAXIS 05/21/2006   Anemia 02/01/2011  GI bleed 02/01/2011   Hypokalemia 02/01/2011   Obesity 09/15/2012   Abnormal CXR 09/15/2012   Dyspnea 10/03/2013   Upper GI bleed  07/05/2014   Poor venous access    SOB (shortness of breath) 07/27/2014   Acute encephalopathy 08/27/2014   Acute renal failure superimposed on stage 3 chronic kidney disease ([facility]) 08/27/2014   Anemia of chronic disease 08/27/2014   UTI (lower urinary tract infection) 08/27/2014   Anemia 09/04/2014   Acute lower GI bleeding 09/04/2014   Acute blood loss anemia    Status epilepticus ([facility]) 10/03/2014   Hypotension 11/13/2014   Symptomatic anemia 11/29/2014   Bleeding gastrointestinal    SOB (shortness of breath) 01/18/2015   Acute GI bleeding 09/28/2015   Angiodysplasia of stomach with hemorrhage    AVM (arteriovenous malformation) of stomach, acquired with hemorrhage    Hyperglycemia due to type 2 diabetes mellitus ([facility]) 05/21/2016   Muscle spasm of both lower legs 06/15/2016   Hypervolemia due to congestive heart failure ([facility]) 08/02/2019   Abnormal LFTs 08/09/2019   Impacted cerumen of left ear 07/15/2020   Hypoglycemia 09/29/2020   Acute upper GI bleed 12/23/2020   Hemorrhagic shock ([name]    Altered mental status 11/94/1740   Acute metabolic encephalopathy 81/44/8185   AKI (acute kidney injury) ([facility])    Dehydration    Past Medical History:  Diagnosis Date   Chronic anemia    Chronic GI bleeding    Family history of anesthesia complication    Frequent nosebleeds    Gastric AV malformation    GERD (gastroesophageal reflux disease)    History of blood transfusion "several"   History of epistaxis    Hyperlipidemia    Iron deficiency anemia    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[facility])    Pneumonia 1990's X 2   Telangiectasia    Type II diabetes mellitus ([facility])     SOCIAL HX:  Social History   Tobacco Use   Smoking status: Former    Packs/day: 1.00    Years: 20.00    Pack years: 20.00    Types: Cigarettes    Quit date: 02/10/1971    Years since quitting: 50.1   Smokeless tobacco: Never   Tobacco comments:    09/15/2012 "smoked 50-60 yr ago"  Substance Use Topics   Alcohol  use: No    Alcohol/week: 0.0 standard drinks     FAMILY HX:  Family History  Problem Relation Age of Onset   Healthy Mother    Stroke Father    Diabetes Niece    Breast cancer Other    Malignant hyperthermia Neg Hx    Seizures Neg Hx       ALLERGIES:  Allergies  Allergen Reactions   Aspirin Nausea And Vomiting      PERTINENT MEDICATIONS:  Outpatient Encounter Medications as of 03/12/2021  Medication Sig   ACCU-CHEK GUIDE test strip USE AS INSTRUCTED TO CHECK BLOOD SUGAR 3 TIMES DAILY. (Patient taking differently: 1 each by Other route in the morning, at noon, and at bedtime.)   Accu-Chek Softclix Lancets lancets 1 each by Other route 3 (three) times daily. Use as instructed to check blood sugar 3 times per day dx code E11.65   acetaminophen (TYLENOL) 325 MG tablet Take 650 mg by mouth every 4 (four) hours as needed for moderate pain.   amLODipine (NORVASC) 2.5 MG tablet Take 1 tablet (2.5 mg total) by mouth daily.   Continuous Blood Gluc Sensor (FREESTYLE LIBRE 2 SENSOR) MISC 2 Devices by  Does not apply route every 14 (fourteen) days.   diclofenac Sodium (VOLTAREN) 1 % GEL Apply 2 g topically 4 (four) times daily. (Patient taking differently: Apply 2 g topically 4 (four) times daily. shoulder)   Dulaglutide (TRULICITY) 1.5 ZW/2.5EN SOPN Inject 1.5 mg into the skin every Thursday.   gabapentin (NEURONTIN) 100 MG capsule Take 1 capsule (100 mg total) by mouth 2 (two) times daily.   insulin lispro (HUMALOG KWIKPEN) 100 UNIT/ML KwikPen Inject 2 Units into the skin 3 (three) times daily.   Insulin Lispro Prot & Lispro (HUMALOG MIX 75/25 KWIKPEN) (75-25) 100 UNIT/ML Kwikpen Inject 12 Units into the skin every morning.   lactulose (CEPHULAC) 10 g packet Take 1 packet (10 g total) by mouth daily.   levETIRAcetam (KEPPRA) 500 MG tablet Take 1 tablet (500 mg total) by mouth 2 (two) times daily.   memantine (NAMENDA) 10 MG tablet TAKE 1 TABLET BY MOUTH TWICE DAILY (Patient taking differently:  Take 10 mg by mouth 2 (two) times daily.)   Misc Natural Products (IMMUNE FORMULA PO) Take 1 tablet by mouth daily.   Multiple Vitamins-Minerals (MULTI FOR HER 50+) TABS Take 1 tablet by mouth daily with breakfast.   NON FORMULARY See admin instructions. Compression Stockings apply in the morning and remove at bedtime   pantoprazole (PROTONIX) 40 MG tablet Take 1 tablet (40 mg total) by mouth 2 (two) times daily.   polyethylene glycol powder (GLYCOLAX/MIRALAX) 17 GM/SCOOP powder Take 17 g by mouth daily as needed for mild constipation.   torsemide (DEMADEX) 20 MG tablet Take 1 tablet (20 mg total) by mouth as needed (please take 1 81m tablet as needed for weight gain of 2 pounds overnight or 5 pounds in a week).   triamcinolone cream (KENALOG) 0.1 % Apply 1 application topically 2 (two) times daily as needed (itching).   triamcinolone ointment (KENALOG) 0.5 % APPLY 1 APPLICATION TOPICALLY TO RASH TWICE DAILY FOR ITCHING (Patient taking differently: Apply 1 application topically 2 (two) times daily as needed (itching).)   Facility-Administered Encounter Medications as of 03/12/2021  Medication   sodium chloride 0.9 % injection 10 mL   sodium chloride flush (NS) 0.9 % injection 10 mL     Thank you for the opportunity to participate in the care of Ms. Eppard.  The palliative care team will continue to follow. Please call our office at [phone] we can be of additional assistance.   Note: Portions of this note were generated with DL[employer] Dictation errors may occur despite best attempts at proofreading.  LTeodoro Spray NP

## 2021-03-13 ENCOUNTER — Encounter: Payer: Self-pay | Admitting: Endocrinology

## 2021-03-13 ENCOUNTER — Ambulatory Visit (INDEPENDENT_AMBULATORY_CARE_PROVIDER_SITE_OTHER): Payer: Medicare Other | Admitting: Endocrinology

## 2021-03-13 ENCOUNTER — Other Ambulatory Visit: Payer: Self-pay

## 2021-03-13 VITALS — BP 140/74 | HR 78 | Wt 128.4 lb

## 2021-03-13 DIAGNOSIS — E1165 Type 2 diabetes mellitus with hyperglycemia: Secondary | ICD-10-CM | POA: Diagnosis not present

## 2021-03-13 DIAGNOSIS — Z794 Long term (current) use of insulin: Secondary | ICD-10-CM | POA: Diagnosis not present

## 2021-03-13 NOTE — Progress Notes (Signed)
Patient ID: PAVNEET MARKWOOD, female   DOB: 11/26/1940, 81 y.o.   MRN: [id number]           Reason for Appointment: Follow-up for Type 2 Diabetes  Referring PCP: Koleen Distance   History of Present Illness:          Date of diagnosis of type 2 diabetes mellitus: 1988       Background history:   Patient has been on insulin for a few years and she is unclear when this was started, according to Epic she was started on Levemir in 2015 Prior to that likely had been on various oral hypoglycemic agents including metformin, glipizide and Tradjenta Her level of control has been somewhat variable but not consistently poor; her A1c has ranged between 5.6 and 7.6  Recent history:   Her A1c is last 6.2   INSULIN regimen is: Humalog mix 12 units before breakfast and lispro 2 units before each meal  Non-insulin hypoglycemic drugs prescribed: Trulicity 1.5 mg weekly  Current management, blood sugar patterns and problems identified:  She is still in the nursing home for rehab  Although she was supposed to take 3 units of mealtime coverage she is not taking only 2 units at the nursing home  However she is taking 12 units of the Humalog mix in the morning as directed on the last visit  As before not clear if at the nursing home she is getting her mealtime insulin consistently at every meal  Blood sugars are quite erratic although majority of them are fairly good but she periodically will have markedly increased readings including in the morning  No consistent pattern seen with her blood sugars except she has high sugars periodically every 2 or 3 days No hypoglycemia She has been regular with Trulicity without any nausea Her niece thinks her appetite is reasonably good and she appears to be eating 3 meals a day         Side effects from medications have been: None known     Meal times are:  Breakfast is at variable times between 8 AM-9  Dinner 7 pm             Exercise:  Unable to  Glucose  monitoring: As below      glucometer:?    Blood Glucose readings    PRE-MEAL Fasting Lunch Dinner Bedtime Overall  Glucose range: 106-389 130-235 112-246    Mean/median:     ?   POST-MEAL PC Breakfast PC Lunch PC Dinner  Glucose range:   110-353  Mean/median:      Previously:   PRE-MEAL Fasting Lunch Dinner Bedtime Overall  Glucose range: 120-186 177-270 324 218, 217   Mean/median:        POST-MEAL PC Breakfast PC Lunch PC Dinner  Glucose range:  242   Mean/median:         Dietician visit, most recent: 11/2018  Weight history:  Wt Readings from Last 3 Encounters:  03/13/21 128 lb 6.4 oz (58.2 kg)  03/02/21 138 lb 7.2 oz (62.8 kg)  02/19/21 138 lb 8 oz (62.8 kg)    Glycemic control:   Lab Results  Component Value Date   HGBA1C 6.2 (H) 02/05/2021   HGBA1C 7.8 (A) 11/28/2020   HGBA1C 6.7 (A) 08/28/2020   Lab Results  Component Value Date   MICROALBUR 1,289.4 (H) 03/07/2019   LDLCALC 57 01/30/2019   CREATININE 1.14 (H) 03/05/2021   Lab Results  Component Value Date   MICRALBCREAT  5 03/28/2018    Lab Results  Component Value Date   FRUCTOSAMINE 404 (H) 10/31/2019   FRUCTOSAMINE 256 06/26/2019    No visits with results within 1 Week(s) from this visit.  Latest known visit with results is:  Office Visit on 03/05/2021  Component Date Value Ref Range Status   Glucose 03/05/2021 134 (H)  70 - 99 mg/dL Final   BUN 03/05/2021 9  8 - 27 mg/dL Final   Creatinine, Ser 03/05/2021 1.14 (H)  0.57 - 1.00 mg/dL Final   eGFR 03/05/2021 49 (L)  >59 mL/min/1.73 Final   BUN/Creatinine Ratio 03/05/2021 8 (L)  12 - 28 Final   Sodium 03/05/2021 145 (H)  134 - 144 mmol/L Final   Potassium 03/05/2021 3.8  3.5 - 5.2 mmol/L Final   Chloride 03/05/2021 109 (H)  96 - 106 mmol/L Final   CO2 03/05/2021 24  20 - 29 mmol/L Final   Calcium 03/05/2021 9.1  8.7 - 10.3 mg/dL Final   LDL Direct 03/05/2021 148 (H)  0 - 99 mg/dL Final    Allergies as of 03/13/2021       Reactions    Aspirin Nausea And Vomiting        Medication List        Accurate as of March 13, 2021  4:39 PM. If you have any questions, ask your nurse or doctor.          Accu-Chek Guide test strip Generic drug: glucose blood USE AS INSTRUCTED TO CHECK BLOOD SUGAR 3 TIMES DAILY. What changed: See the new instructions.   Accu-Chek Softclix Lancets lancets 1 each by Other route 3 (three) times daily. Use as instructed to check blood sugar 3 times per day dx code E11.65   acetaminophen 325 MG tablet Commonly known as: TYLENOL Take 650 mg by mouth every 4 (four) hours as needed for moderate pain.   amLODipine 2.5 MG tablet Commonly known as: NORVASC Take 1 tablet (2.5 mg total) by mouth daily.   diclofenac Sodium 1 % Gel Commonly known as: VOLTAREN Apply 2 g topically 4 (four) times daily. What changed: additional instructions   FreeStyle Libre 2 Sensor Misc 2 Devices by Does not apply route every 14 (fourteen) days.   gabapentin 100 MG capsule Commonly known as: NEURONTIN Take 1 capsule (100 mg total) by mouth 2 (two) times daily.   IMMUNE FORMULA PO Take 1 tablet by mouth daily.   insulin lispro 100 UNIT/ML KwikPen Commonly known as: HumaLOG KwikPen Inject 2 Units into the skin 3 (three) times daily.   Insulin Lispro Prot & Lispro (75-25) 100 UNIT/ML Kwikpen Commonly known as: HumaLOG Mix 75/25 KwikPen Inject 12 Units into the skin every morning.   lactulose 10 g packet Commonly known as: CEPHULAC Take 1 packet (10 g total) by mouth daily.   levETIRAcetam 500 MG tablet Commonly known as: KEPPRA Take 1 tablet (500 mg total) by mouth 2 (two) times daily.   memantine 10 MG tablet Commonly known as: NAMENDA TAKE 1 TABLET BY MOUTH TWICE DAILY   Multi For Her 50+ Tabs Take 1 tablet by mouth daily with breakfast.   NON FORMULARY See admin instructions. Compression Stockings apply in the morning and remove at bedtime   pantoprazole 40 MG tablet Commonly known  as: PROTONIX Take 1 tablet (40 mg total) by mouth 2 (two) times daily.   polyethylene glycol powder 17 GM/SCOOP powder Commonly known as: GLYCOLAX/MIRALAX Take 17 g by mouth daily as needed for mild  constipation.   torsemide 20 MG tablet Commonly known as: DEMADEX Take 1 tablet (20 mg total) by mouth as needed (please take 1 $Remo'20mg'fPlaj$  tablet as needed for weight gain of 2 pounds overnight or 5 pounds in a week).   traMADol 50 MG tablet Commonly known as: ULTRAM Take 50 mg by mouth every 6 (six) hours as needed.   triamcinolone cream 0.1 % Commonly known as: KENALOG Apply 1 application topically 2 (two) times daily as needed (itching). What changed: Another medication with the same name was changed. Make sure you understand how and when to take each.   triamcinolone ointment 0.5 % Commonly known as: KENALOG APPLY 1 APPLICATION TOPICALLY TO RASH TWICE DAILY FOR ITCHING What changed: See the new instructions.   Trulicity 1.5 EG/3.1DV Sopn Generic drug: Dulaglutide Inject 1.5 mg into the skin every Thursday.        Allergies:  Allergies  Allergen Reactions   Aspirin Nausea And Vomiting    Past Medical History:  Diagnosis Date   Chronic anemia    Chronic diastolic CHF (congestive heart failure) ([place]) 10/03/2013   Chronic GI bleeding    /notes 11/29/2014   Family history of anesthesia complication    "niece has a hard time coming out" (09/15/2012)   Frequent nosebleeds    chronic   Gastric AV malformation    /notes 11/29/2014   GERD (gastroesophageal reflux disease)    Heart murmur 04/01/2017   Moderate AVSC on echo 09/2016   History of blood transfusion "several"   History of epistaxis    HTN (hypertension), benign 03/02/2012   Hyperlipidemia    Iron deficiency anemia    chronic infusions"   Lichen planus    Both lower extremities   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    Past Surgical History:  Procedure Laterality Date   CATARACT EXTRACTION     "I think it was just one eye"   ESOPHAGOGASTRODUODENOSCOPY  02/26/2011   Procedure: ESOPHAGOGASTRODUODENOSCOPY (EGD);  Surgeon: [name], MD;  Location: [name];  Service: Endoscopy;  Laterality: N/A;   ESOPHAGOGASTRODUODENOSCOPY N/A 11/08/2012   Procedure: ESOPHAGOGASTRODUODENOSCOPY (EGD);  Surgeon: [name], MD;  Location: [name];  Service: Endoscopy;  Laterality: N/A;   ESOPHAGOGASTRODUODENOSCOPY N/A 10/04/2013   Procedure: ESOPHAGOGASTRODUODENOSCOPY (EGD);  Surgeon: Winfield Cunas., MD;  Location: [name];  Service: Endoscopy;  Laterality: N/A;  with APC on stand-by   ESOPHAGOGASTRODUODENOSCOPY N/A 07/06/2014   Procedure: ESOPHAGOGASTRODUODENOSCOPY (EGD);  Surgeon: Clarene Essex, MD;  Location: [name];  Service: Endoscopy;  Laterality: N/A;   ESOPHAGOGASTRODUODENOSCOPY N/A 09/05/2014   Procedure: ESOPHAGOGASTRODUODENOSCOPY (EGD);  Surgeon: Laurence Spates, MD;  Location: [name];  Service: Endoscopy;  Laterality: N/A;  APC on standby to control bleeding   ESOPHAGOGASTRODUODENOSCOPY N/A 11/29/2014   Procedure: ESOPHAGOGASTRODUODENOSCOPY (EGD);  Surgeon: [name], MD;  Location[facility] ENDOSCOPY;  Service: Endoscopy;  Laterality: N/A;   ESOPHAGOGASTRODUODENOSCOPY N/A 09/28/2015   Procedure: ESOPHAGOGASTRODUODENOSCOPY (EGD);  Surgeon: Clarene Essex, MD;  Location: [facility] ENDOSCOPY;  Service: Endoscopy;  Laterality: N/A;   ESOPHAGOGASTRODUODENOSCOPY (EGD) WITH PROPOFOL N/A 12/04/2017   Procedure: ESOPHAGOGASTRODUODENOSCOPY (EGD) WITH PROPOFOL;  Surgeon:  [name], MD;  Location: [place];  Service: Endoscopy;  Laterality: N/A;   ESOPHAGOGASTRODUODENOSCOPY (EGD) WITH  PROPOFOL N/A 12/24/2020   Procedure: ESOPHAGOGASTRODUODENOSCOPY (EGD) WITH PROPOFOL;  Surgeon: Arta Silence, MD;  Location: [place];  Service: Endoscopy;  Laterality: N/A;   ESOPHAGOGASTRODUODENOSCOPY (EGD) WITH PROPOFOL N/A 01/19/2021   Procedure: ESOPHAGOGASTRODUODENOSCOPY (EGD) WITH PROPOFOL;  Surgeon: [name], MD;  Location: [place];  Service: Endoscopy;  Laterality: N/A;   ESOPHAGOGASTRODUODENOSCOPY (EGD) WITH PROPOFOL N/A 02/06/2021   Procedure: ESOPHAGOGASTRODUODENOSCOPY (EGD) WITH PROPOFOL;  Surgeon: Ronnette Juniper, MD;  Location: [place];  Service: Gastroenterology;  Laterality: N/A;   ESOPHAGOGASTRODUODENOSCOPY ENDOSCOPY  08/19/2006   with laser treatment   HEMOSTASIS CLIP PLACEMENT  12/24/2020   Procedure: HEMOSTASIS CLIP PLACEMENT;  Surgeon: Arta Silence, MD;  Location: [facility] ENDOSCOPY;  Service: Endoscopy;;   HEMOSTASIS CLIP PLACEMENT  01/19/2021   Procedure: HEMOSTASIS CLIP PLACEMENT;  Surgeon: [name], MD;  Location: [place];  Service: Endoscopy;;   HOT HEMOSTASIS N/A 07/06/2014   Procedure: HOT HEMOSTASIS (ARGON PLASMA COAGULATION/BICAP);  Surgeon: Clarene Essex, MD;  Location: [name];  Service: Endoscopy;  Laterality: N/A;   HOT HEMOSTASIS N/A 09/28/2015   Procedure: HOT HEMOSTASIS (ARGON PLASMA COAGULATION/BICAP);  Surgeon: Clarene Essex, MD;  Location: [facility] ENDOSCOPY;  Service: Endoscopy;  Laterality: N/A;   HOT HEMOSTASIS N/A 12/04/2017   Procedure: HOT HEMOSTASIS (ARGON PLASMA COAGULATION/BICAP);  Surgeon: [name], MD;  Location: [place];  Service: Endoscopy;  Laterality: N/A;   IR ANGIOGRAM SELECTIVE EACH ADDITIONAL VESSEL  02/05/2021   IR ANGIOGRAM VISCERAL SELECTIVE  02/05/2021   IR EMBO ART  VEN HEMORR LYMPH EXTRAV  INC GUIDE ROADMAPPING  02/05/2021   IR US GUIDE VASC ACCESS RIGHT  02/05/2021   NASAL HEMORRHAGE CONTROL     "for bleeding"    SAVORY DILATION  02/26/2011   Procedure: SAVORY DILATION;  Surgeon: [name], MD;  Location: [name];  Service: Endoscopy;  Laterality: N/A;  c-arm needed   SCLEROTHERAPY  12/24/2020   Procedure: SCLEROTHERAPY;  Surgeon: Arta Silence, MD;  Location: [facility] ENDOSCOPY;  Service: Endoscopy;;   SCLEROTHERAPY  01/19/2021   Procedure: Clide Deutscher;  Surgeon: [name], MD;  Location: [facility] ENDOSCOPY;  Service: Endoscopy;;   SUBMUCOSAL INJECTION  12/04/2017   Procedure: SUBMUCOSAL INJECTION;  Surgeon: [name], MD;  Location: [facility] ENDOSCOPY;  Service: Endoscopy;;    Family History  Problem Relation Age of Onset   Healthy Mother    Stroke Father    Diabetes Niece    Breast cancer Other    Malignant hyperthermia Neg Hx    Seizures Neg Hx     Social History:  reports that she quit smoking about 50 years ago. Her smoking use included cigarettes. She has a 20.00 pack-year smoking history. She has never used smokeless tobacco. She reports that she does not drink alcohol and does not use drugs.   Review of Systems   Lipid history: Not on a statin drug    Lab Results  Component Value Date   CHOL 133 01/30/2019   HDL 41.30 01/30/2019   LDLCALC 57 01/30/2019   LDLDIRECT 148 (H) 03/05/2021   TRIG 176.0 (H) 01/30/2019   CHOLHDL 3 01/30/2019           Hypertension: Treated with amlodipine and olmesartan  BP Readings from Last 3 Encounters:  03/13/21 140/74  03/05/21 (!) 142/62  03/03/21 (!) 152/50   Renal function history: Has CKD of unclear etiology  She is on Demadex prescribed  by PCP   Lab Results  Component Value Date   CREATININE 1.14 (H) 03/05/2021   CREATININE 1.23 (H) 03/03/2021   CREATININE 1.35 (H) 03/02/2021   Lab Results  Component Value Date   K 3.8 16/08/3708     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, microalbuminuria     Physical Examination:  BP 140/74    Pulse 78    Wt 128 lb 6.4 oz (58.2 kg)    LMP  (LMP Unknown)    SpO2 99%    BMI 22.75 kg/m    ASSESSMENT:  Diabetes type 2 on insulin  See  history of present illness for detailed discussion of current diabetes management, blood sugar patterns and problems identified  A1c is last at 6.2 compared to 7.8  She is currently on Trulicity 1.5 mg, Humalog 2 units before meals and Humalog mix 12 units in the morning  Blood sugars are reasonably controlled as reviewed from her nursing home records However as before she has been consistent control with some days blood sugars going up significantly even in the morning for no apparent reason However this could be related to the nursing home skipping the mealtime dose at times   PLAN:    Instructions were given in detail to the nursing home for regimen of insulin especially mealtime insulin to be given regardless of Premeal blood sugar Change in insulin dose as yet   There are no Patient Instructions on file for this visit.   Elayne Snare 03/13/2021, 4:39 PM   Note: This office note was prepared with Dragon voice recognition system technology. Any transcriptional errors that result from this process are unintentional.

## 2021-03-14 ENCOUNTER — Encounter (HOSPITAL_COMMUNITY): Payer: Self-pay

## 2021-03-14 ENCOUNTER — Other Ambulatory Visit: Payer: Self-pay

## 2021-03-14 ENCOUNTER — Emergency Department (HOSPITAL_COMMUNITY)
Admission: EM | Admit: 2021-03-14 | Discharge: 2021-03-15 | Disposition: A | Discharge status: Home / Self Care | Payer: Medicare Other | Attending: Emergency Medicine | Admitting: Emergency Medicine

## 2021-03-14 DIAGNOSIS — E1165 Type 2 diabetes mellitus with hyperglycemia: Secondary | ICD-10-CM | POA: Diagnosis not present

## 2021-03-14 DIAGNOSIS — F0390 Unspecified dementia without behavioral disturbance: Secondary | ICD-10-CM | POA: Diagnosis not present

## 2021-03-14 DIAGNOSIS — Z79899 Other long term (current) drug therapy: Secondary | ICD-10-CM | POA: Insufficient documentation

## 2021-03-14 DIAGNOSIS — I5032 Chronic diastolic (congestive) heart failure: Secondary | ICD-10-CM | POA: Diagnosis not present

## 2021-03-14 DIAGNOSIS — Z794 Long term (current) use of insulin: Secondary | ICD-10-CM | POA: Diagnosis not present

## 2021-03-14 DIAGNOSIS — R251 Tremor, unspecified: Secondary | ICD-10-CM | POA: Diagnosis not present

## 2021-03-14 DIAGNOSIS — I110 Hypertensive heart disease with heart failure: Secondary | ICD-10-CM | POA: Insufficient documentation

## 2021-03-14 DIAGNOSIS — R739 Hyperglycemia, unspecified: Secondary | ICD-10-CM

## 2021-03-14 LAB — CBC WITH DIFFERENTIAL/PLATELET
Abs Immature Granulocytes: 0.01 10*3/uL (ref 0.00–0.07)
Basophils Absolute: 0 10*3/uL (ref 0.0–0.1)
Basophils Relative: 1 %
Eosinophils Absolute: 0.3 10*3/uL (ref 0.0–0.5)
Eosinophils Relative: 5 %
HCT: 35.6 % — ABNORMAL LOW (ref 36.0–46.0)
Hemoglobin: 11.1 g/dL — ABNORMAL LOW (ref 12.0–15.0)
Immature Granulocytes: 0 %
Lymphocytes Relative: 12 %
Lymphs Abs: 0.6 10*3/uL — ABNORMAL LOW (ref 0.7–4.0)
MCH: 28.3 pg (ref 26.0–34.0)
MCHC: 31.2 g/dL (ref 30.0–36.0)
MCV: 90.8 fL (ref 80.0–100.0)
Monocytes Absolute: 0.5 10*3/uL (ref 0.1–1.0)
Monocytes Relative: 10 %
Neutro Abs: 3.6 10*3/uL (ref 1.7–7.7)
Neutrophils Relative %: 72 %
Platelets: 157 10*3/uL (ref 150–400)
RBC: 3.92 MIL/uL (ref 3.87–5.11)
RDW: 14.5 % (ref 11.5–15.5)
WBC: 5 10*3/uL (ref 4.0–10.5)
nRBC: 0 % (ref 0.0–0.2)

## 2021-03-14 LAB — COMPREHENSIVE METABOLIC PANEL
ALT: 13 U/L (ref 0–44)
AST: 28 U/L (ref 15–41)
Albumin: 2.9 g/dL — ABNORMAL LOW (ref 3.5–5.0)
Alkaline Phosphatase: 90 U/L (ref 38–126)
Anion gap: 9 (ref 5–15)
BUN: 15 mg/dL (ref 8–23)
CO2: 27 mmol/L (ref 22–32)
Calcium: 9.6 mg/dL (ref 8.9–10.3)
Chloride: 109 mmol/L (ref 98–111)
Creatinine, Ser: 1.21 mg/dL — ABNORMAL HIGH (ref 0.44–1.00)
GFR, Estimated: 45 mL/min — ABNORMAL LOW (ref >60)
Glucose, Bld: 416 mg/dL — ABNORMAL HIGH (ref 70–99)
Potassium: 3.6 mmol/L (ref 3.5–5.1)
Sodium: 145 mmol/L (ref 135–145)
Total Bilirubin: 0.6 mg/dL (ref 0.3–1.2)
Total Protein: 6 g/dL — ABNORMAL LOW (ref 6.5–8.1)

## 2021-03-14 LAB — AMMONIA: Ammonia: 86 umol/L — ABNORMAL HIGH (ref 9–35)

## 2021-03-14 MED ORDER — INSULIN ASPART 100 UNIT/ML IJ SOLN
13.0000 [IU] | Freq: Once | INTRAMUSCULAR | Status: AC
  Administered 2021-03-14: 13 [IU] via SUBCUTANEOUS

## 2021-03-14 NOTE — ED Triage Notes (Signed)
Pt presents to the ED from [place] with [facility] with complaints of tremors onset 1 hour PTA. Pt states she was here 1 week ago for same and it was her ammonia level being elevated. Pt denies other complaints.

## 2021-03-14 NOTE — ED Provider Notes (Signed)
[facility] EMERGENCY DEPARTMENT Provider Note   CSN: [id number] Arrival date & time: 03/14/21  0240     History  Chief Complaint  Patient presents with   Tremors    [name] is a 81 y.o. female.  Patient with history of cirrhosis and dementia presents from nursing home chief complaint of recurrent tremors.  Brought in by her daughter who also provides history.  Reportedly has tremors when she is reaching for something.  She had similar episodes last week was admitted as it was thought to be due to hepatic encephalopathy.  However family states that she appears to be at her baseline mentally but they have noticed these tremors with her hands when she reaches out for things.  No reports of fevers or cough or vomiting or diarrhea.  Reportedly on lactulose but unknown how many bowel movements she is having.      Home Medications Prior to Admission medications   Medication Sig Start Date End Date Taking? Authorizing Provider  ACCU-CHEK GUIDE test strip USE AS INSTRUCTED TO CHECK BLOOD SUGAR 3 TIMES DAILY. Patient taking differently: 1 each by Other route in the morning, at noon, and at bedtime. 08/08/20   Elayne Snare, MD  Accu-Chek Softclix Lancets lancets 1 each by Other route 3 (three) times daily. Use as instructed to check blood sugar 3 times per day dx code E11.65 02/01/20   Elayne Snare, MD  acetaminophen (TYLENOL) 325 MG tablet Take 650 mg by mouth every 4 (four) hours as needed for moderate pain.    [provider]  amLODipine (NORVASC) 2.5 MG tablet Take 1 tablet (2.5 mg total) by mouth daily. 03/03/21 03/03/22  Orvis Brill, MD  Continuous Blood Gluc Sensor (FREESTYLE LIBRE 2 SENSOR) MISC 2 Devices by Does not apply route every 14 (fourteen) days. 04/25/20   Elayne Snare, MD  diclofenac Sodium (VOLTAREN) 1 % GEL Apply 2 g topically 4 (four) times daily. Patient taking differently: Apply 2 g topically 4 (four) times daily. shoulder 12/26/20    Lacinda Axon, MD  Dulaglutide (TRULICITY) 1.5 XB/3.5HG SOPN Inject 1.5 mg into the skin every Thursday. 02/13/21   Orvis Brill, MD  gabapentin (NEURONTIN) 100 MG capsule Take 1 capsule (100 mg total) by mouth 2 (two) times daily. 02/12/21   Orvis Brill, MD  insulin lispro (HUMALOG KWIKPEN) 100 UNIT/ML KwikPen Inject 2 Units into the skin 3 (three) times daily. 02/12/21   Orvis Brill, MD  Insulin Lispro Prot & Lispro (HUMALOG MIX 75/25 KWIKPEN) (75-25) 100 UNIT/ML Kwikpen Inject 12 Units into the skin every morning. 02/12/21   Orvis Brill, MD  lactulose (CEPHULAC) 10 g packet Take 1 packet (10 g total) by mouth daily. 03/03/21   Orvis Brill, MD  levETIRAcetam (KEPPRA) 500 MG tablet Take 1 tablet (500 mg total) by mouth 2 (two) times daily. 02/12/21   Orvis Brill, MD  memantine (NAMENDA) 10 MG tablet TAKE 1 TABLET BY MOUTH TWICE DAILY Patient taking differently: Take 10 mg by mouth 2 (two) times daily. 11/11/20   [name], Areeg N, DO  Misc Natural Products (IMMUNE FORMULA PO) Take 1 tablet by mouth daily.    [provider]  Multiple Vitamins-Minerals (MULTI FOR HER 50+) TABS Take 1 tablet by mouth daily with breakfast.    [provider]  NON FORMULARY See admin instructions. Compression Stockings apply in the morning and remove at bedtime    [provider]  pantoprazole (  PROTONIX) 40 MG tablet Take 1 tablet (40 mg total) by mouth 2 (two) times daily. 12/26/20   Lacinda Axon, MD  polyethylene glycol powder (GLYCOLAX/MIRALAX) 17 GM/SCOOP powder Take 17 g by mouth daily as needed for mild constipation.    [provider]  torsemide (DEMADEX) 20 MG tablet Take 1 tablet (20 mg total) by mouth as needed (please take 1 20mg  tablet as needed for weight gain of 2 pounds overnight or 5 pounds in a week). 03/05/21 06/03/21  Lenna Sciara, NP  traMADol (ULTRAM) 50 MG tablet Take 50 mg by mouth every 6 (six) hours as needed.  03/06/21   [provider]  triamcinolone cream (KENALOG) 0.1 % Apply 1 application topically 2 (two) times daily as needed (itching).    [provider]  triamcinolone ointment (KENALOG) 0.5 % APPLY 1 APPLICATION TOPICALLY TO RASH TWICE DAILY FOR ITCHING Patient taking differently: Apply 1 application topically 2 (two) times daily as needed (itching). 08/16/20   Riesa Pope, MD      Allergies    Aspirin    Review of Systems   Review of Systems  Unable to perform ROS: Dementia   Physical Exam Updated Vital Signs BP (!) 156/116    Pulse 68    Temp 98.4 F (36.9 C) (Oral)    Resp 16    Ht 5\' 3"  (1.6 m)    Wt 58.2 kg    LMP  (LMP Unknown)    SpO2 100%    BMI 22.73 kg/m  Physical Exam Constitutional:      General: She is not in acute distress.    Appearance: Normal appearance.  HENT:     Head: Normocephalic.     Nose: Nose normal.  Eyes:     Extraocular Movements: Extraocular movements intact.  Cardiovascular:     Rate and Rhythm: Normal rate.  Pulmonary:     Effort: Pulmonary effort is normal.  Musculoskeletal:        General: Normal range of motion.     Cervical back: Normal range of motion.     Comments: No tremors noted at rest.  However when the patient exerts herself or puts on her hands there are coarse tremors noted.  Neurological:     General: No focal deficit present.     Mental Status: She is alert. Mental status is at baseline.     Comments: Patient otherwise oriented to time person and place.  She is able to tell me the year correctly at 2023.    ED Results / Procedures / Treatments   Labs (all labs ordered are listed, but only abnormal results are displayed) Labs Reviewed  CBC WITH DIFFERENTIAL/PLATELET - Abnormal; Notable for the following components:      Result Value   Hemoglobin 11.1 (*)    HCT 35.6 (*)    Lymphs Abs 0.6 (*)    All other components within normal limits  COMPREHENSIVE METABOLIC PANEL - Abnormal; Notable for the  following components:   Glucose, Bld 416 (*)    Creatinine, Ser 1.21 (*)    Total Protein 6.0 (*)    Albumin 2.9 (*)    GFR, Estimated 45 (*)    All other components within normal limits  AMMONIA - Abnormal; Notable for the following components:   Ammonia 86 (*)    All other components within normal limits    EKG None  Radiology No results found.  Procedures Procedures    Medications Ordered in ED  Medications  insulin aspart (novoLOG) injection 13 Units (has no administration in time range)    ED Course/ Medical Decision Making/ A&P                           Medical Decision Making Amount and/or Complexity of Data Reviewed Labs: ordered.   Basic labs are sent chemistry is unremarkable.  Creatinine 1.2 glucose elevated 400 but no evidence of DKA.  Ammonia level is 86.  This appears lower than prior levels.  Patient given insulin pending repeat glucose.        Final Clinical Impression(s) / ED Diagnoses Final diagnoses:  Tremor  Hyperglycemia    Rx / DC Orders ED Discharge Orders     None         Luna Fuse, MD 03/14/21 [phone]

## 2021-03-14 NOTE — ED Notes (Signed)
Waiting for IV team. Family at bedside and does not want ED to stick her peripherally, only access her port. MD aware. No acute changes noted. Will continue to monitor.

## 2021-03-14 NOTE — ED Notes (Signed)
Pt moved to room 27 per IVT request. IVT at bedside. Noted at this time that pt's portacath is already accessed, after further discovery, it was not removed from her last D/C from the ED. IVT to remove and restart. Will continue to monitor.

## 2021-03-15 DIAGNOSIS — R251 Tremor, unspecified: Secondary | ICD-10-CM | POA: Diagnosis not present

## 2021-03-15 LAB — CBG MONITORING, ED: Glucose-Capillary: 266 mg/dL — ABNORMAL HIGH (ref 70–99)

## 2021-03-15 MED ORDER — HEPARIN SOD (PORK) LOCK FLUSH 100 UNIT/ML IV SOLN
500.0000 [IU] | Freq: Once | INTRAVENOUS | Status: AC
  Administered 2021-03-15: 500 [IU]

## 2021-03-15 MED ORDER — HEPARIN SOD (PORK) LOCK FLUSH 100 UNIT/ML IV SOLN
500.0000 [IU] | INTRAVENOUS | Status: DC
  Filled 2021-03-15: qty 5

## 2021-03-15 NOTE — ED Notes (Signed)
Pt given ice water and ice chips per request. Call bell within reach, side rails up x2, no acute changes noted. Will continue to monitor. Waiting for [facility] transport.

## 2021-03-15 NOTE — ED Notes (Signed)
Pt assisted with bedpan. No acute changes noted. Pt denies any complaints. Pt waiting for [facility]. Will continue to monitor.

## 2021-03-26 NOTE — Addendum Note (Signed)
Addended by: Estella Husk on: 03/26/2021 04:54 PM   Modules accepted: Orders

## 2021-03-27 ENCOUNTER — Telehealth: Payer: Self-pay | Admitting: Endocrinology

## 2021-03-27 NOTE — Telephone Encounter (Signed)
Blood sugars from nursing home fax were reviewed and are reasonably good with only 3 readings over 200 in the last 3 days and fasting glucose as low as 100.  No change in insulin recommended

## 2021-04-02 ENCOUNTER — Other Ambulatory Visit: Payer: Medicare Other

## 2021-04-02 ENCOUNTER — Ambulatory Visit: Payer: Medicare Other | Admitting: Hematology

## 2021-04-02 ENCOUNTER — Ambulatory Visit: Payer: Medicare Other

## 2021-04-09 ENCOUNTER — Encounter: Payer: Self-pay | Admitting: Hematology

## 2021-04-14 ENCOUNTER — Other Ambulatory Visit: Payer: Self-pay

## 2021-04-14 DIAGNOSIS — D500 Iron deficiency anemia secondary to blood loss (chronic): Secondary | ICD-10-CM

## 2021-04-15 ENCOUNTER — Inpatient Hospital Stay: Payer: Medicare Other | Admitting: Hematology

## 2021-04-15 ENCOUNTER — Inpatient Hospital Stay: Payer: Medicare Other

## 2021-04-15 DIAGNOSIS — I780 Hereditary hemorrhagic telangiectasia: Secondary | ICD-10-CM

## 2021-04-17 ENCOUNTER — Other Ambulatory Visit: Payer: Self-pay

## 2021-04-17 ENCOUNTER — Emergency Department (HOSPITAL_COMMUNITY)
Admission: EM | Admit: 2021-04-17 | Discharge: 2021-04-18 | Disposition: A | Discharge status: Skilled Nursing Facility | Payer: Medicare Other | Attending: Emergency Medicine | Admitting: Emergency Medicine

## 2021-04-17 DIAGNOSIS — D649 Anemia, unspecified: Secondary | ICD-10-CM

## 2021-04-17 DIAGNOSIS — Z794 Long term (current) use of insulin: Secondary | ICD-10-CM | POA: Diagnosis not present

## 2021-04-17 DIAGNOSIS — E875 Hyperkalemia: Secondary | ICD-10-CM | POA: Diagnosis not present

## 2021-04-17 DIAGNOSIS — Z79899 Other long term (current) drug therapy: Secondary | ICD-10-CM | POA: Insufficient documentation

## 2021-04-17 DIAGNOSIS — R531 Weakness: Secondary | ICD-10-CM | POA: Diagnosis present

## 2021-04-17 LAB — I-STAT CHEM 8, ED
BUN: 13 mg/dL (ref 8–23)
Calcium, Ion: 1.16 mmol/L (ref 1.15–1.40)
Chloride: 117 mmol/L — ABNORMAL HIGH (ref 98–111)
Creatinine, Ser: 1.4 mg/dL — ABNORMAL HIGH (ref 0.44–1.00)
Glucose, Bld: 143 mg/dL — ABNORMAL HIGH (ref 70–99)
HCT: 20 % — ABNORMAL LOW (ref 36.0–46.0)
Hemoglobin: 6.8 g/dL — CL (ref 12.0–15.0)
Potassium: 5.8 mmol/L — ABNORMAL HIGH (ref 3.5–5.1)
Sodium: 144 mmol/L (ref 135–145)
TCO2: 22 mmol/L (ref 22–32)

## 2021-04-17 LAB — BASIC METABOLIC PANEL
Anion gap: 6 (ref 5–15)
BUN: 13 mg/dL (ref 8–23)
CO2: 22 mmol/L (ref 22–32)
Calcium: 8.8 mg/dL — ABNORMAL LOW (ref 8.9–10.3)
Chloride: 117 mmol/L — ABNORMAL HIGH (ref 98–111)
Creatinine, Ser: 1.42 mg/dL — ABNORMAL HIGH (ref 0.44–1.00)
GFR, Estimated: 37 mL/min — ABNORMAL LOW (ref >60)
Glucose, Bld: 152 mg/dL — ABNORMAL HIGH (ref 70–99)
Potassium: 5.9 mmol/L — ABNORMAL HIGH (ref 3.5–5.1)
Sodium: 145 mmol/L (ref 135–145)

## 2021-04-17 LAB — CBC WITH DIFFERENTIAL/PLATELET
Abs Immature Granulocytes: 0.02 10*3/uL (ref 0.00–0.07)
Basophils Absolute: 0 10*3/uL (ref 0.0–0.1)
Basophils Relative: 1 %
Eosinophils Absolute: 0.2 10*3/uL (ref 0.0–0.5)
Eosinophils Relative: 3 %
HCT: 25.6 % — ABNORMAL LOW (ref 36.0–46.0)
Hemoglobin: 7.3 g/dL — ABNORMAL LOW (ref 12.0–15.0)
Immature Granulocytes: 0 %
Lymphocytes Relative: 18 %
Lymphs Abs: 1 10*3/uL (ref 0.7–4.0)
MCH: 25.7 pg — ABNORMAL LOW (ref 26.0–34.0)
MCHC: 28.5 g/dL — ABNORMAL LOW (ref 30.0–36.0)
MCV: 90.1 fL (ref 80.0–100.0)
Monocytes Absolute: 0.6 10*3/uL (ref 0.1–1.0)
Monocytes Relative: 12 %
Neutro Abs: 3.6 10*3/uL (ref 1.7–7.7)
Neutrophils Relative %: 66 %
Platelets: 186 10*3/uL (ref 150–400)
RBC: 2.84 MIL/uL — ABNORMAL LOW (ref 3.87–5.11)
RDW: 15.8 % — ABNORMAL HIGH (ref 11.5–15.5)
WBC: 5.5 10*3/uL (ref 4.0–10.5)
nRBC: 0 % (ref 0.0–0.2)

## 2021-04-17 LAB — PREPARE RBC (CROSSMATCH)

## 2021-04-17 LAB — POC OCCULT BLOOD, ED: Fecal Occult Bld: NEGATIVE

## 2021-04-17 MED ORDER — SODIUM CHLORIDE 0.9 % IV SOLN
10.0000 mL/h | Freq: Once | INTRAVENOUS | Status: AC
Start: 2021-04-17 — End: 2021-04-17
  Administered 2021-04-17: 17:00:00 10 mL/h via INTRAVENOUS

## 2021-04-17 MED ORDER — SODIUM CHLORIDE 0.9 % IV BOLUS
500.0000 mL | Freq: Once | INTRAVENOUS | Status: AC
  Administered 2021-04-17: 13:00:00 500 mL via INTRAVENOUS

## 2021-04-17 MED ORDER — FUROSEMIDE 10 MG/ML IJ SOLN
40.0000 mg | Freq: Once | INTRAMUSCULAR | Status: AC
  Administered 2021-04-17: 16:00:00 40 mg via INTRAVENOUS
  Filled 2021-04-17: qty 4

## 2021-04-17 MED ORDER — ACETAMINOPHEN 325 MG PO TABS
650.0000 mg | ORAL_TABLET | Freq: Once | ORAL | Status: AC
Start: 2021-04-17 — End: 2021-04-17
  Administered 2021-04-17: 23:00:00 650 mg via ORAL
  Filled 2021-04-17: qty 2

## 2021-04-17 NOTE — ED Notes (Signed)
Pt had bowel movement on bedpan. Pt was cleaned and changed, purwic was placed along with foam adhesive over pre-existing sacral wound. ?

## 2021-04-17 NOTE — ED Provider Notes (Signed)
[facility] EMERGENCY DEPARTMENT Provider Note   CSN: [id number] Arrival date & time: 04/17/21  1102     History  Chief Complaint  Patient presents with   Weakness    Hemoglobin 6.8 per facility    [name] is a 81 y.o. female.  She is sent in from her facility for evaluation of a low hemoglobin.  Patient denies any complaints and has not seen any bleeding.  She does say she has had a history of needing blood transfusion.  No chest pain or shortness of breath no lightheadedness or syncope.  No vomiting or diarrhea.  No urinary symptoms no hematuria.  Does state feels cold  The history is provided by the patient.  Weakness Severity:  Mild Progression:  Unchanged Relieved by:  None tried Worsened by:  Activity Ineffective treatments:  None tried Associated symptoms: no abdominal pain, no chest pain, no diarrhea, no dysuria, no fever, no headaches, no nausea, no shortness of breath and no vomiting       Home Medications Prior to Admission medications   Medication Sig Start Date End Date Taking? Authorizing Provider  ACCU-CHEK GUIDE test strip USE AS INSTRUCTED TO CHECK BLOOD SUGAR 3 TIMES DAILY. Patient taking differently: 1 each by Other route in the morning, at noon, and at bedtime. 08/08/20   Elayne Snare, MD  Accu-Chek Softclix Lancets lancets 1 each by Other route 3 (three) times daily. Use as instructed to check blood sugar 3 times per day dx code E11.65 02/01/20   Elayne Snare, MD  acetaminophen (TYLENOL) 325 MG tablet Take 650 mg by mouth every 4 (four) hours as needed for moderate pain.    [provider]  amLODipine (NORVASC) 2.5 MG tablet Take 1 tablet (2.5 mg total) by mouth daily. 03/03/21 03/03/22  Orvis Brill, MD  Continuous Blood Gluc Sensor (FREESTYLE LIBRE 2 SENSOR) MISC 2 Devices by Does not apply route every 14 (fourteen) days. 04/25/20   Elayne Snare, MD  diclofenac Sodium (VOLTAREN) 1 % GEL Apply 2 g topically 4 (four) times  daily. Patient taking differently: Apply 2 g topically 4 (four) times daily. shoulder 12/26/20   Lacinda Axon, MD  Dulaglutide (TRULICITY) 1.5 IR/4.4RX SOPN Inject 1.5 mg into the skin every Thursday. 02/13/21   Orvis Brill, MD  gabapentin (NEURONTIN) 100 MG capsule Take 1 capsule (100 mg total) by mouth 2 (two) times daily. 02/12/21   Orvis Brill, MD  insulin lispro (HUMALOG KWIKPEN) 100 UNIT/ML KwikPen Inject 2 Units into the skin 3 (three) times daily. 02/12/21   Orvis Brill, MD  Insulin Lispro Prot & Lispro (HUMALOG MIX 75/25 KWIKPEN) (75-25) 100 UNIT/ML Kwikpen Inject 12 Units into the skin every morning. 02/12/21   Orvis Brill, MD  lactulose (CEPHULAC) 10 g packet Take 1 packet (10 g total) by mouth daily. 03/03/21   Orvis Brill, MD  levETIRAcetam (KEPPRA) 500 MG tablet Take 1 tablet (500 mg total) by mouth 2 (two) times daily. 02/12/21   Orvis Brill, MD  memantine (NAMENDA) 10 MG tablet TAKE 1 TABLET BY MOUTH TWICE DAILY Patient taking differently: Take 10 mg by mouth 2 (two) times daily. 11/11/20   [name], Areeg N, DO  Misc Natural Products (IMMUNE FORMULA PO) Take 1 tablet by mouth daily.    [provider]  Multiple Vitamins-Minerals (MULTI FOR HER 50+) TABS Take 1 tablet by mouth daily with breakfast.    [provider]  NON FORMULARY  See admin instructions. Compression Stockings apply in the morning and remove at bedtime    [provider]  pantoprazole (PROTONIX) 40 MG tablet Take 1 tablet (40 mg total) by mouth 2 (two) times daily. 12/26/20   Lacinda Axon, MD  polyethylene glycol powder (GLYCOLAX/MIRALAX) 17 GM/SCOOP powder Take 17 g by mouth daily as needed for mild constipation.    [provider]  torsemide (DEMADEX) 20 MG tablet Take 1 tablet (20 mg total) by mouth as needed (please take 1 '20mg'$  tablet as needed for weight gain of 2 pounds overnight or 5 pounds in a week). 03/05/21 06/03/21  Lenna Sciara, NP  traMADol (ULTRAM) 50 MG tablet Take 50 mg by mouth every 6 (six) hours as needed. 03/06/21   [provider]      Allergies    Aspirin    Review of Systems   Review of Systems  Constitutional:  Negative for fever.  Respiratory:  Negative for shortness of breath.   Cardiovascular:  Negative for chest pain.  Gastrointestinal:  Negative for abdominal pain, diarrhea, nausea and vomiting.  Genitourinary:  Negative for dysuria.  Musculoskeletal:  Negative for joint swelling.  Skin:  Negative for rash.  Neurological:  Positive for weakness. Negative for headaches.   Physical Exam Updated Vital Signs BP (!) 137/42 (BP Location: Left Arm)    Pulse 66    Resp 11    Ht '5\' 3"'$  (1.6 m)    Wt 54.4 kg    LMP  (LMP Unknown)    SpO2 99%    BMI 21.26 kg/m  Physical Exam Vitals and nursing note reviewed.  Constitutional:      General: She is not in acute distress.    Appearance: Normal appearance. She is well-developed.  HENT:     Head: Normocephalic and atraumatic.  Eyes:     Conjunctiva/sclera: Conjunctivae normal.  Cardiovascular:     Rate and Rhythm: Normal rate and regular rhythm.     Heart sounds: No murmur heard. Pulmonary:     Effort: Pulmonary effort is normal. No respiratory distress.     Breath sounds: Normal breath sounds.  Abdominal:     Palpations: Abdomen is soft.     Tenderness: There is no abdominal tenderness. There is no guarding or rebound.  Musculoskeletal:        General: No swelling or tenderness. Normal range of motion.     Cervical back: Neck supple.  Skin:    General: Skin is warm and dry.     Capillary Refill: Capillary refill takes less than 2 seconds.  Neurological:     General: No focal deficit present.     Mental Status: She is alert.     Sensory: No sensory deficit.     Motor: No weakness.    ED Results / Procedures / Treatments   Labs (all labs ordered are listed, but only abnormal results are displayed) Labs Reviewed  BASIC  METABOLIC PANEL - Abnormal; Notable for the following components:      Result Value   Potassium 5.9 (*)    Chloride 117 (*)    Glucose, Bld 152 (*)    Creatinine, Ser 1.42 (*)    Calcium 8.8 (*)    GFR, Estimated 37 (*)    All other components within normal limits  CBC WITH DIFFERENTIAL/PLATELET - Abnormal; Notable for the following components:   RBC 2.84 (*)    Hemoglobin 7.3 (*)    HCT 25.6 (*)  MCH 25.7 (*)    MCHC 28.5 (*)    RDW 15.8 (*)    All other components within normal limits  I-STAT CHEM 8, ED - Abnormal; Notable for the following components:   Potassium 5.8 (*)    Chloride 117 (*)    Creatinine, Ser 1.40 (*)    Glucose, Bld 143 (*)    Hemoglobin 6.8 (*)    HCT 20.0 (*)    All other components within normal limits  PROTIME-INR  POC OCCULT BLOOD, ED  TYPE AND SCREEN  PREPARE RBC (CROSSMATCH)    EKG EKG Interpretation  Date/Time:  Thursday April 17 2021 11:09:37 EST Ventricular Rate:  69 PR Interval:  156 QRS Duration: 98 QT Interval:  387 QTC Calculation: 415 R Axis:   17 Text Interpretation: Sinus rhythm Abnormal R-wave progression, early transition Probable left ventricular hypertrophy No significant change since prior 1/23 Confirmed by Aletta Edouard [id number] on 04/17/2021 11:36:41 AM  Radiology No results found.  Procedures Procedures    Medications Ordered in ED Medications  0.9 %  sodium chloride infusion (10 mL/hr Intravenous New Bag/Given 04/17/21 1637)  sodium chloride 0.9 % bolus 500 mL (0 mLs Intravenous Stopped 04/17/21 1340)  furosemide (LASIX) injection 40 mg (40 mg Intravenous Given 04/17/21 1541)    ED Course/ Medical Decision Making/ A&P Clinical Course as of 04/17/21 2027  Thu Apr 17, 2021  1250 Rectal exam done with nurse Loma Sousa as chaperone.  Normal tone no masses.  Sample sent to lab for guaiac. [MB]    Clinical Course User Index [MB] Hayden Rasmussen, MD                           Medical Decision Making Amount and/or  Complexity of Data Reviewed Labs: ordered.  Risk Prescription drug management.  This patient complains of low hemoglobin and feeling cold; this involves an extensive number of treatment Options and is a complaint that carries with it a high risk of complications and morbidity. The differential includes chronic anemia, GI bleed, lab error, dehydration  I ordered, reviewed and interpreted labs, which included CBC with normal white count, hemoglobin lower than priors, chemistries with elevated potassium and elevated BUN/creatinine, fecal occult negative I ordered medication transfusion of packed red blood cells, saline and Lasix for hyperkalemia and reviewed PMP when indicated. Additional history obtained from patient's daughter and EMS Previous records obtained and reviewed in epic, patient had endoscopy back in December Cardiac monitoring reviewed, normal sinus rhythm Social determinants considered, no significant barriers Critical Interventions: None  After the interventions stated above, I reevaluated the patient and found patient to be fairly asymptomatic Admission and further testing considered, no indications for admission at this time.  She is receiving transfusion of 1 unit of packed red blood cells.  Care is signed out to oncoming provider Dr. Tomi Bamberger to reassess patient after transfusion.  Likely she can be discharged back to her facility where they can recheck some lab work on her.          Final Clinical Impression(s) / ED Diagnoses Final diagnoses:  Symptomatic anemia  Hyperkalemia    Rx / DC Orders ED Discharge Orders     None         Hayden Rasmussen, MD 04/17/21 2030

## 2021-04-17 NOTE — ED Notes (Signed)
[facility] called- 11th on list ?

## 2021-04-17 NOTE — Discharge Instructions (Addendum)
You are seen in the emergency department for evaluation of a low blood count.  Your hemoglobin was 7.5.  You were given 1 unit of blood.  Your potassium was also elevated and you were given some diuretics.  There was no evidence of bleeding.  You will need to follow-up with your doctor to get repeat blood work.  Return to the emergency department if any worsening or concerning symptoms. ?

## 2021-04-17 NOTE — ED Triage Notes (Signed)
Pt arrived via [facility] from [facility] assisted living facility. Per EMS facility requested transport for pt after finding her hemoglobin to be 6.8, pt c/o weakness x1 day. Pt alert to person, place, event on arrival to ED. Pt reports she has had to have blood transfusions in the past.  ? ?EMS VS ?BP 132/76 ?HR 62 ?RR 18 ?SpO2 96%  ?209 CBG ?

## 2021-04-17 NOTE — ED Provider Notes (Signed)
Pt seen by Dr Melina Copa. Please see his note.  Plan was to discharge after the transfusion was complete ? ?7:45 PM patient has received her transfusion.  She is otherwise feeling well.  Patient states she is ready for discharge ?  ?[name] Rank, MD ?04/17/21 1945 ? ?

## 2021-04-17 NOTE — ED Notes (Signed)
Attempted to call report to SNF, advised by staff the nurse to give report to is not currently on the hall, they took call back number and advised will call back in approx 5 minutes.  ?

## 2021-04-18 LAB — TYPE AND SCREEN
ABO/RH(D): A POS
Antibody Screen: NEGATIVE
Unit division: 0

## 2021-04-18 LAB — BPAM RBC
Blood Product Expiration Date: 202303182359
ISSUE DATE / TIME: 202303091334
Unit Type and Rh: 6200

## 2021-05-05 ENCOUNTER — Other Ambulatory Visit: Payer: Self-pay

## 2021-05-05 ENCOUNTER — Inpatient Hospital Stay: Payer: Medicare Other | Attending: Hematology

## 2021-05-05 ENCOUNTER — Encounter: Payer: Self-pay | Admitting: Hematology

## 2021-05-05 ENCOUNTER — Inpatient Hospital Stay: Payer: Medicare Other

## 2021-05-05 ENCOUNTER — Inpatient Hospital Stay (HOSPITAL_BASED_OUTPATIENT_CLINIC_OR_DEPARTMENT_OTHER): Payer: Medicare Other | Admitting: Hematology

## 2021-05-05 VITALS — BP 132/69 | HR 84 | Temp 98.4°F | Resp 18

## 2021-05-05 DIAGNOSIS — D500 Iron deficiency anemia secondary to blood loss (chronic): Secondary | ICD-10-CM | POA: Diagnosis not present

## 2021-05-05 DIAGNOSIS — K31819 Angiodysplasia of stomach and duodenum without bleeding: Secondary | ICD-10-CM

## 2021-05-05 DIAGNOSIS — I780 Hereditary hemorrhagic telangiectasia: Secondary | ICD-10-CM | POA: Insufficient documentation

## 2021-05-05 DIAGNOSIS — Z95828 Presence of other vascular implants and grafts: Secondary | ICD-10-CM

## 2021-05-05 LAB — CMP (CANCER CENTER ONLY)
ALT: 9 U/L (ref 0–44)
AST: 21 U/L (ref 15–41)
Albumin: 2.8 g/dL — ABNORMAL LOW (ref 3.5–5.0)
Alkaline Phosphatase: 97 U/L (ref 38–126)
Anion gap: 7 (ref 5–15)
BUN: 17 mg/dL (ref 8–23)
CO2: 25 mmol/L (ref 22–32)
Calcium: 9.1 mg/dL (ref 8.9–10.3)
Chloride: 110 mmol/L (ref 98–111)
Creatinine: 1.15 mg/dL — ABNORMAL HIGH (ref 0.44–1.00)
GFR, Estimated: 48 mL/min — ABNORMAL LOW (ref >60)
Glucose, Bld: 338 mg/dL — ABNORMAL HIGH (ref 70–99)
Potassium: 3.3 mmol/L — ABNORMAL LOW (ref 3.5–5.1)
Sodium: 142 mmol/L (ref 135–145)
Total Bilirubin: 0.6 mg/dL (ref 0.3–1.2)
Total Protein: 5.9 g/dL — ABNORMAL LOW (ref 6.5–8.1)

## 2021-05-05 LAB — CBC WITH DIFFERENTIAL (CANCER CENTER ONLY)
Abs Immature Granulocytes: 0.01 10*3/uL (ref 0.00–0.07)
Basophils Absolute: 0 10*3/uL (ref 0.0–0.1)
Basophils Relative: 0 %
Eosinophils Absolute: 0.4 10*3/uL (ref 0.0–0.5)
Eosinophils Relative: 6 %
HCT: 34.8 % — ABNORMAL LOW (ref 36.0–46.0)
Hemoglobin: 10.7 g/dL — ABNORMAL LOW (ref 12.0–15.0)
Immature Granulocytes: 0 %
Lymphocytes Relative: 15 %
Lymphs Abs: 1.1 10*3/uL (ref 0.7–4.0)
MCH: 25.9 pg — ABNORMAL LOW (ref 26.0–34.0)
MCHC: 30.7 g/dL (ref 30.0–36.0)
MCV: 84.3 fL (ref 80.0–100.0)
Monocytes Absolute: 0.6 10*3/uL (ref 0.1–1.0)
Monocytes Relative: 8 %
Neutro Abs: 5.1 10*3/uL (ref 1.7–7.7)
Neutrophils Relative %: 71 %
Platelet Count: 216 10*3/uL (ref 150–400)
RBC: 4.13 MIL/uL (ref 3.87–5.11)
RDW: 18.2 % — ABNORMAL HIGH (ref 11.5–15.5)
WBC Count: 7.2 10*3/uL (ref 4.0–10.5)
nRBC: 0 % (ref 0.0–0.2)

## 2021-05-05 LAB — FERRITIN: Ferritin: 28 ng/mL (ref 11–307)

## 2021-05-05 LAB — SAMPLE TO BLOOD BANK

## 2021-05-05 MED ORDER — SODIUM CHLORIDE 0.9 % IV SOLN
4.6000 mg/kg | Freq: Once | INTRAVENOUS | Status: DC
  Filled 2021-05-05: qty 12

## 2021-05-05 MED ORDER — SODIUM CHLORIDE 0.9% FLUSH
10.0000 mL | INTRAVENOUS | Status: DC | PRN
  Administered 2021-05-05: 10 mL

## 2021-05-05 MED ORDER — SODIUM CHLORIDE 0.9 % IV SOLN: Freq: Once | INTRAVENOUS | Status: AC

## 2021-05-05 MED ORDER — HEPARIN SOD (PORK) LOCK FLUSH 100 UNIT/ML IV SOLN
500.0000 [IU] | Freq: Once | INTRAVENOUS | Status: AC | PRN
  Administered 2021-05-05: 500 [IU]

## 2021-05-05 NOTE — Progress Notes (Signed)
OK to keep Bevacizumab dosed at 300 mg per Dr. Burr Medico. ?Dose calculated ~ 279 mg (5 mg/kg).  Will avoid waste w/ 300 mg dose. ? ?Kennith Center, Pharm.D., CPP ?05/05/2021'@2'$ :17 PM ? ? ?

## 2021-05-05 NOTE — Patient Instructions (Signed)
[facility] MEDICAL [facility]COLOG[facility]  Discharge Instructions: °Thank you for choosing [facility] to provide your oncology and hematology care.  ° °If you have a lab appointment with the Cancer Center, please go directly to the Cancer Center and check in at the registration area. °  °Wear comfortable clothing and clothing appropriate for easy access to any Portacath or PICC line.  ° °We strive to give you quality time with your provider. You may need to reschedule your appointment if you arrive late (15 or more minutes).  Arriving late affects you and other patients whose appointments are after yours.  Also, if you miss three or more appointments without notifying the office, you may be dismissed from the clinic at the provider’s discretion.    °  °For prescription refill requests, have your pharmacy contact our office and allow 72 hours for refills to be completed.   ° °Today you received the following chemotherapy and/or immunotherapy agents: bevacizumab    °  °To help prevent nausea and vomiting after your treatment, we encourage you to take your nausea medication as directed. ° °BELOW ARE SYMPTOMS THAT SHOULD BE REPORTED IMMEDIATELY: °*FEVER GREATER THAN 100.4 F (38 °C) OR HIGHER °*CHILLS OR SWEATING °*NAUSEA AND VOMITING THAT IS NOT CONTROLLED WITH YOUR NAUSEA MEDICATION °*UNUSUAL SHORTNESS OF BREATH °*UNUSUAL BRUISING OR BLEEDING °*URINARY PROBLEMS (pain or burning when urinating, or frequent urination) °*BOWEL PROBLEMS (unusual diarrhea, constipation, pain near the anus) °TENDERNESS IN MOUTH AND THROAT WITH OR WITHOUT PRESENCE OF ULCERS (sore throat, sores in mouth, or a toothache) °UNUSUAL RASH, SWELLING OR PAIN  °UNUSUAL VAGINAL DISCHARGE OR ITCHING  ° °Items with * indicate a potential emergency and should be followed up as soon as possible or go to the Emergency Department if any problems should occur. ° °Please show the CHEMOTHERAPY ALERT CARD or IMMUNOTHERAPY ALERT CARD at check-in  to the Emergency Department and triage nurse. ° °Should you have questions after your visit or need to cancel or reschedule your appointment, please contact [facility]  Dept: [phone]  and follow the prompts.  Office hours are 8:00 a.m. to 4:30 p.m. Monday - Friday. Please note that voicemails left after 4:00 p.m. may not be returned until the following business day.  We are closed weekends and major holidays. You have access to a nurse at all times for urgent questions. Please call the main number to the clinic Dept: [phone] and follow the prompts. ° ° °For any non-urgent questions, you may also contact your provider using MyChart. We now offer e-Visits for anyone 18 and older to request care online for non-urgent symptoms. For details visit [email or web address]. °  °Also download the MyChart app! Go to the app store, search "MyChart", open the app, select , and log in with your MyChart username and password. ° °Due to Covid, a mask is required upon entering the hospital/clinic. If you do not have a mask, one will be given to you upon arrival. For doctor visits, patients may have 1 support person aged 18 or older with them. For treatment visits, patients cannot have anyone with them due to current Covid guidelines and our immunocompromised population.  ° °

## 2021-05-05 NOTE — Progress Notes (Signed)
?[facility]   ?[phone] [phone]   ?Clinic Follow up Note  ? ?Patient Care Team: ?Mike Craze, DO as PCP - General (Internal Medicine) ?[name], MD as PCP - Cardiology (Cardiology) ?Truitt Merle, MD as Consulting Physician (Hematology) ?Elayne Snare, MD as Consulting Physician (Endocrinology) ? ?Date of Service:  05/05/2021 ? ?CHIEF COMPLAINT: f/u of anemia due to chronic blood loss, HHT ? ?CURRENT THERAPY:  ?1. Iv feraheme as needed if ferritin<100, latest on 11/04/20 ?2. Avastin '5mg'$ /kg, starting 05/09/18. Currently q6weeks ?3. Blood transfusion if Hg<8.0 ? ?ASSESSMENT & PLAN:  ?[name] is a 81 y.o. female with  ? ?1. Hereditary hemorrhagic telangiectasia (HHT), with epistaxis and frequent GI bleeding ?-She receives blood transfusion as needed, but has not needed since 04/2018 when she started Beva. ?-She is on Avastin intermittently/PRN due to high proteinuria. ?-anemia worsened off Avastin. She was given her most recent dose on 01/17/21. ?-she required blood transfusion on 04/17/21 for hgb of 6.8 per her SNF. ?-we will restart her Avastin today ?  ?2. Iron deficiency anemia secondary to #1 ?-She receives IV Feraheme as needed for Ferritin <100, most recently 11/04/20 ?-hgb 10.7 today (05/05/21), improved  ?  ?3. Dementia  ?-She takes Aricept and memantine. On Keppra for seizures.  ?-f/u with neurology.  ?  ?5. DM, CHF, CKD stage III and HTN secondary to Avastin - followed by Dr. Dwyane Dee ?-f/u with PCP ?  ?6. Proteinuria ?-This is likely secondary to avastin, but she also has diabetes  ?-She receives bevacizumab as needed ?  ?7. Iron overload?  ?-her recent liver MRI on 02/04/21 showed signs of iron deposits in liver, spleen and marrow space. ?-will hold iv feraheme unless ferritin<10  ?  ?8. Deconditioning  ?-Patient has been moved to a long-term nursing facility due to her the care she needs ?-She has low appetite, not eating well ?-We reviewed the hospice care when  she deteriorates further ? ?Plan ?-proceed with Bevacizumab today ?-lab and beva in 4 and 10 weeks ?-f/u in 10 weeks ? ? ? ?No problem-specific Assessment & Plan notes found for this encounter. ? ? ?INTERVAL HISTORY:  ?[name] is here for a follow up of anemia. She was last seen by me on 02/19/21. She presents to the clinic accompanied by her daughter. ?They report she is doing better. She denies any bleeding. ?  ?All other systems were reviewed with the patient and are negative. ? ?MEDICAL HISTORY:  ?Past Medical History:  ?Diagnosis Date  ? Chronic anemia   ? Chronic diastolic CHF (congestive heart failure) ([place]) 10/03/2013  ? Chronic GI bleeding   ? [name] 11/29/2014  ? Family history of anesthesia complication   ? "niece has a hard time coming out" (09/15/2012)  ? Frequent nosebleeds   ? chronic  ? Gastric AV malformation   ? [name] 11/29/2014  ? GERD (gastroesophageal reflux disease)   ? Heart murmur 04/01/2017  ? 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 History of blood transfusion "several"  ? History of epistaxis   ? HTN (hypertension), benign 03/02/2012  ? Hyperlipidemia   ? Iron deficiency anemia   ? chronic infusions"  ? Lichen planus   ? Both lower extremities  ? 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 [name] 11/29/2014  ? Overgrown toenails 03/18/2017  ? Pneumonia 1990's X 2  ? Pulmonary HTN ([place]) 04/01/2017  ? PASP 58mHg on echo 09/2016 and 438mg by echo 2019  ? Seizures (  [place]) 09/2014  ? Symptomatic anemia 11/29/2014  ? Telangiectasia   ? Gastric   ? Type II diabetes mellitus ([place])   ? insulin requiring.  ? ? ?SURGICAL HISTORY: ?Past Surgical History:  ?Procedure Laterality Date  ? CATARACT EXTRACTION    ? "I think it was just one eye"  ? ESOPHAGOGASTRODUODENOSCOPY  02/26/2011  ? Procedure: ESOPHAGOGASTRODUODENOSCOPY (EGD);  Surgeon: [name], MD;  Location: [name];  Service: Endoscopy;  Laterality: N/A;  ? ESOPHAGOGASTRODUODENOSCOPY N/A 11/08/2012  ? Procedure: ESOPHAGOGASTRODUODENOSCOPY (EGD);  Surgeon:  [name], MD;  Location: [name];  Service: Endoscopy;  Laterality: N/A;  ? ESOPHAGOGASTRODUODENOSCOPY N/A 10/04/2013  ? Procedure: ESOPHAGOGASTRODUODENOSCOPY (EGD);  Surgeon: Winfield Cunas., MD;  Location: [name];  Service: Endoscopy;  Laterality: N/A;  with APC on stand-by  ? ESOPHAGOGASTRODUODENOSCOPY N/A 07/06/2014  ? Procedure: ESOPHAGOGASTRODUODENOSCOPY (EGD);  Surgeon: Clarene Essex, MD;  Location: [name];  Service: Endoscopy;  Laterality: N/A;  ? ESOPHAGOGASTRODUODENOSCOPY N/A 09/05/2014  ? Procedure: ESOPHAGOGASTRODUODENOSCOPY (EGD);  Surgeon: Laurence Spates, MD;  Location: [name];  Service: Endoscopy;  Laterality: N/A;  APC on standby to control bleeding  ? ESOPHAGOGASTRODUODENOSCOPY N/A 11/29/2014  ? Procedure: ESOPHAGOGASTRODUODENOSCOPY (EGD);  Surgeon: [name], MD;  [facility] END[facility]COPY;  Service: Endoscopy;  Laterality: N/A;  ? ESOPHAGOGASTRODUODENOSCOPY N/A 09/28/2015  ? Procedure: ESOPHAGOGASTRODUODENOSCOPY (EGD);  Surgeon: Clarene Essex, MD;  Location[facility] ENDOSCOPY;  Service: Endoscopy;  Laterality: N/A;  ? ESOPHAGOGASTRODUODENOSCOPY (EGD) WITH PROPOFOL N/A 12/04/2017  ? Procedure: ESOPHAGOGASTRODUODENOSCOPY (EGD) WITH PROPOFOL;  Surgeon: [name], MD;  Location: [facility];  Service: Endoscopy;  Laterality: N/A;  ? ESOPHAGOGASTRODUODENOSCOPY (EGD) WITH PROPOFOL N/A 12/24/2020  ? Procedure: ESOPHAGOGASTRODUODENOSCOPY (EGD) WITH PROPOFOL;  Surgeon: Arta Silence, MD;  Location: [facility] ENDOSCOPY;  Service: Endoscopy;  Laterality: N/A;  ? ESOPHAGOGASTRODUODENOSCOPY (EGD) WITH PROPOFOL N/A 01/19/2021  ? Procedure: ESOPHAGOGASTRODUODENOSCOPY (EGD) WITH PROPOFOL;  Surgeon: [name], MD;  Location: [facility];  Service: Endoscopy;  Laterality: N/A;  ? ESOPHAGOGASTRODUODENOSCOPY (EGD) WITH PROPOFOL N/A 02/06/2021  ? Procedure: ESOPHAGOGASTRODUODENOSCOPY (EGD) WITH PROPOFOL;  Surgeon: Ronnette Juniper, MD;  Location: [facility];  Service: Gastroenterology;   Laterality: N/A;  ? ESOPHAGOGASTRODUODENOSCOPY ENDOSCOPY  08/19/2006  ? with laser treatment  ? HEMOSTASIS CLIP PLACEMENT  12/24/2020  ? Procedure: HEMOSTASIS CLIP PLACEMENT;  Surgeon: Arta Silence, MD;  Location: [facility] ENDOSCOPY;  Service: Endoscopy;;  ? HEMOSTASIS CLIP PLACEMENT  01/19/2021  ? Procedure: HEMOSTASIS CLIP PLACEMENT;  Surgeon: [name], MD;  Location: [facility];  Service: Endoscopy;;  ? HOT HEMOSTASIS N/A 07/06/2014  ? Procedure: HOT HEMOSTASIS (ARGON PLASMA COAGULATION/BICAP);  Surgeon: Clarene Essex, MD;  Location: [name];  Service: Endoscopy;  Laterality: N/A;  ? HOT HEMOSTASIS N/A 09/28/2015  ? Procedure: HOT HEMOSTASIS (ARGON PLASMA COAGULATION/BICAP);  Surgeon: Clarene Essex, MD;  Location: [facility] ENDOSCOPY;  Service: Endoscopy;  Laterality: N/A;  ? HOT HEMOSTASIS N/A 12/04/2017  ? Procedure: HOT HEMOSTASIS (ARGON PLASMA COAGULATION/BICAP);  Surgeon: [name], MD;  Location: [name];  Service: Endoscopy;  Laterality: N/A;  ? IR ANGIOGRAM SELECTIVE EACH ADDITIONAL VESSEL  02/05/2021  ? IR ANGIOGRAM VISCERAL SELECTIVE  02/05/2021  ? IR EMBO ART  VEN HEMORR LYMPH EXTRAV  INC GUIDE ROADMAPPING  02/05/2021  ? IR US GUIDE VASC ACCESS RIGHT  02/05/2021  ? NASAL HEMORRHAGE CONTROL    ? "for bleeding"   ? SAVORY DILATION  02/26/2011  ? Procedure: SAVORY DILATION;  Surgeon: [name], MD;  Location: [name];  Service: Endoscopy;  Laterality: N/A;  c-arm  needed  ? SCLEROTHERAPY  12/24/2020  ? Procedure: SCLEROTHERAPY;  Surgeon: Arta Silence, MD;  Location[facility] ENDOSCOPY;  Service: Endoscopy;;  ? SCLEROTHERAPY  01/19/2021  ? Procedure: SCLEROTHERAPY;  Surgeon: [name], MD;  Location: [facility] ENDOSCOPY;  Service: Endoscopy;;  ? SUBMUCOSAL INJECTION  12/04/2017  ? Procedure: SUBMUCOSAL INJECTION;  Surgeon: [name], MD;  Location: [facility] ENDOSCOPY;  Service: Endoscopy;;  ? ? ?I have reviewed the social history and family history with the patient and they are unchanged  from previous note. ? ?ALLERGIES:  is allergic to aspirin. ? ?MEDICATIONS:  ?Current Outpatient Medications  ?Medication Sig Dispense Refill  ? ACCU-CHEK GUIDE test strip USE AS INSTRUCTED TO CHECK BLOO

## 2021-05-05 NOTE — Progress Notes (Signed)
Per provider patient is ok to treat without urine protein.  No further concerns at this time.   ?

## 2021-05-07 ENCOUNTER — Other Ambulatory Visit: Payer: Self-pay | Admitting: Hematology

## 2021-05-08 ENCOUNTER — Telehealth: Payer: Self-pay | Admitting: Hematology

## 2021-05-08 NOTE — Telephone Encounter (Signed)
Scheduled follow-up appointments per 3/27 los. [facility] is aware. Mailed calendar. ?

## 2021-05-13 ENCOUNTER — Non-Acute Institutional Stay: Payer: Medicare Other | Admitting: Hospice

## 2021-05-13 ENCOUNTER — Encounter: Payer: Self-pay | Admitting: Hematology

## 2021-05-13 DIAGNOSIS — R269 Unspecified abnormalities of gait and mobility: Secondary | ICD-10-CM

## 2021-05-13 DIAGNOSIS — E119 Type 2 diabetes mellitus without complications: Secondary | ICD-10-CM

## 2021-05-13 DIAGNOSIS — Z515 Encounter for palliative care: Secondary | ICD-10-CM

## 2021-05-13 DIAGNOSIS — I5032 Chronic diastolic (congestive) heart failure: Secondary | ICD-10-CM

## 2021-05-13 NOTE — Progress Notes (Signed)
? ? ?Manufacturing engineer ?Community Palliative Care Consult Note ?Telephone: [phone]  ?Fax: [phone] ? ?PATIENT NAME: [name] ?[address] Room [phone] ?[place] [address] ?[phone] (home)  ?DOB: 1940/03/08 ?MRN: [id number] ? ?PRIMARY CARE PROVIDER:    ?[name], Areeg N, DO,  ?[address] ?[place] [address] ?[phone] ? ?REFERRING PROVIDER:  Lavone Neri, NP ? ? ?RESPONSIBLE PARTY:   Self ?Contact: Tabitha [phone] ? ?Contact Information   ? ? Name Relation Home Work Mobile  ? Huey Romans Daughter [phone]  [phone]  ? Farruggia,[name] Spouse [phone]  [phone]  ? [place]anes,Denise Niece   [phone]  ? [name] Other [phone]    ? ?  ? ? ? ?I met face to face with patient and at facility. Palliative Care was asked to follow this patient by consultation request of  Danie Binder NP to address advance care planning, complex medical decision making and goals of care clarification.  NP called Tabitha and updated on her visit as was requested. She expressed appreciation for the call.  ?  ASSESSMENT AND / RECOMMENDATIONS:  ? ?CODE STATUS: Patient is a Full code ? ?Goals of Care: Goals include to maximize quality of life and symptom management ? ?Symptom Management/Plan: ?Dementia:  Memory loss/confusion is ongoing, incontinent of bowel and bladder FAST 6D. Encourage reminiscence, word search/puzzles, cueing for recollection.  Promote calm approach and engaging environment.  Safety and fall precautions.  Optimize ongoing supportive care. ?Continue  memantine as currently ordered.  ?Gait disturbance: No report of fall since last visit. Fall precautions in place.  Maintain safe physical activity as tolerated to maintain muscle mass.  Provide assistance as needed for transfers. ?Type 2 DM:  A1c 6.2 02/05/21. Continue Insulin as ordered. No concentrated sweets. Diabetic diet, no empty caloric beverages. Check CBG as ordered to help ensure tight control.  Repeat  A1c. ?CFH: Continue Torsemide as ordered. Elevate BLE during the day as much as possible to promote circulation. Adhere to Fluid and salt limits. Monitor weight closely and report weight gain of 3 Ibs in a day or 5 Ibs in a week  to eval for additional diuresis ? ?Follow up: Palliative care will continue to follow for complex medical decision making, advance care planning, and clarification of goals. Return 6 weeks or prn. Encouraged to call provider sooner with any concerns.  ? ?Family /Caregiver/Community Supports: Patient is in SNF for ongoing care ? ?HOSPICE ELIGIBILITY/DIAGNOSIS: TBD ? ?Chief Complaint: Follow up visit ? ?HISTORY OF PRESENT ILLNESS:  [name] is a 81 y.o. year old female  with multiple morbidities requiring close monitoring/management with high risk of complications and morbidity: Advanced Dementia,Type 2 DM. CHF, Anemia, gait disturbance.  Patient denies pain/discomfort, nursing reports patient was recently assessed for hospice service but did not qualify; patient continues to sleep more, able to feed set on set up; with no urgent needs.History obtained from review of EMR, discussion with primary team, caregiver, family and/or [name].  ?Review and summarization of Epic records shows history from other than patient. Rest of 10 point ROS asked and negative.  ?I reviewed as needed, available labs, patient records, imaging, studies and related documents from the EMR. ? ? ?ROS ?General: NAD ?EYES: denies vision changes ?ENMT: denies dysphagia ?Cardiovascular: denies chest pain/discomfort ?Pulmonary: denies cough, denies SOB ?Abdomen: endorses good appetite, denies constipation/diarrhea ?GU: denies dysuria, urinary frequency ?MSK:  endorses weakness,  no falls reported ?Skin: denies rashes or wounds ?Neurological: denies pain, denies insomnia ?Psych:  Endorses positive mood ?Heme/lymph/immuno: denies bruises, abnormal bleeding ? ?Physical Exam: ?Constitutional: NAD ?General: Well groomed,  cooperative ?EYES: anicteric sclera, lids intact, no discharge  ?ENMT: Moist mucous membrane ?CV: S1 S2, RRR, no LE edema ?Pulmonary: LCTA, no increased work of breathing, no cough, ?Abdomen: active BS + 4 quadrants, soft and non tender ?GU: no suprapubic tenderness ?MSK: weakness, limited ROM, max assist to pivot/transfer ?Skin: warm and dry, no rashes or wounds on visible skin ?Neuro:  weakness, otherwise non focal, memory loss/confusion ?Psych: non-anxious affect ?Hem/lymph/immuno: no widespread bruising ? ? ?PAST MEDICAL HISTORY:  ?Active Ambulatory Problems  ?  Diagnosis Date Noted  ? 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 Fatigue 07/10/2011  ? Type 2 diabetes mellitus without complication ([place]) 68/61/6837  ? Chronic diastolic CHF (congestive heart failure) ([place]) 10/03/2013  ? Acute on chronic heart failure with preserved ejection fraction (HFpEF) ([place]) 10/03/2013  ? Acute kidney injury ([place]) 08/27/2014  ? Hyperammonemia ([place]) 10/03/2014  ? HTN (hypertension), benign 03/02/2012  ? CKD (chronic kidney disease), stage III ([place])   ? Simple febrile convulsions ([place]) 10/21/2014  ? Memory loss 01/30/2015  ? Healthcare maintenance 06/20/2015  ? Seborrheic keratosis 06/20/2015  ? 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 PAF (paroxysmal atrial fibrillation) ([place]) 09/29/2015  ? Pain in joint of right shoulder 10/22/2015  ? Peripheral vascular disease ([place]) 12/30/2015  ? Dry skin 02/17/2016  ? Anemia due to chronic blood loss   ? Venous insufficiency of both lower extremities 04/27/2016  ? Epistaxis 07/30/2016  ? Osteopenia after menopause 01/13/2017  ? Overgrown toenails 03/18/2017  ? Pulmonary HTN ([place]) 04/01/2017  ? Heart murmur 04/01/2017  ? Lower extremity pain, bilateral 07/15/2017  ? Pain localized to upper abdomen 10/05/2017  ? Hematemesis 12/04/2017  ? Leg weakness, bilateral 12/23/2017  ? Hyperpigmented skin lesion 02/07/2018  ? Acute right hip pain 03/10/2018  ? Iron  deficiency anemia due to chronic blood loss 04/19/2018  ? Port-A-Cath in place 05/23/2018  ? Hyperkalemia 10/18/2018  ? Neuropathy 07/27/2019  ? Seizures ([place]) 09/2014  ? Hearing loss 05/29/2020  ? Bullous lesion 10/08/2020  ? GI bleed 01/18/2021  ? AKI (acute kidney injury) ([place]) 01/28/2021  ? Altered mental status 02/03/2021  ? AMS (altered mental status) 02/04/2021  ? ?Resolved Ambulatory Problems  ?  Diagnosis Date Noted  ? Type 2 diabetes mellitus with complication, with long-term current use of insulin ([place]) 04/08/2006  ? HYPERLIPIDEMIA 04/08/2006  ? Anemia, iron deficiency 04/21/2006  ? Iron deficiency anemia due to chronic blood loss 04/08/2006  ? HYPERTENSION, BENIGN SYSTEMIC 04/08/2006  ? SYMPTOM, EPISTAXIS 05/21/2006  ? Anemia 02/01/2011  ? GI bleed 02/01/2011  ? Hypokalemia 02/01/2011  ? Obesity 09/15/2012  ? Abnormal CXR 09/15/2012  ? Dyspnea 10/03/2013  ? Upper GI bleed 07/05/2014  ? Poor venous access   ? SOB (shortness of breath) 07/27/2014  ? Acute encephalopathy 08/27/2014  ? Acute renal failure superimposed on stage 3 chronic kidney disease ([place]) 08/27/2014  ? Anemia of chronic disease 08/27/2014  ? UTI (lower urinary tract infection) 08/27/2014  ? Anemia 09/04/2014  ? Acute lower GI bleeding 09/04/2014  ? Acute blood loss anemia   ? Status epilepticus ([place]) 10/03/2014  ? Hypotension 11/13/2014  ? Symptomatic anemia 11/29/2014  ? Bleeding gastrointestinal   ? SOB (shortness of breath) 01/18/2015  ? Acute GI bleeding 09/28/2015  ? Angiodysplasia of stomach with hemorrhage   ? AVM (arteriovenous malformation) of stomach, acquired with hemorrhage   ?  Hyperglycemia due to type 2 diabetes mellitus ([place]) 05/21/2016  ? Muscle spasm of both lower legs 06/15/2016  ? Hypervolemia due to congestive heart failure ([place]) 08/02/2019  ? Abnormal LFTs 08/09/2019  ? Impacted cerumen of left ear 07/15/2020  ? Hypoglycemia 09/29/2020  ? Acute upper GI bleed 12/23/2020  ? Hemorrhagic shock ([place])   ? Altered mental  status 12/31/2020  ? Acute metabolic encephalopathy 43/14/2767  ? AKI (acute kidney injury) ([place])   ? Dehydration   ? ?Past Medical History:  ?Diagnosis Date  ? Chronic anemia   ? Chronic GI bleeding   ? Family h

## 2021-06-02 ENCOUNTER — Other Ambulatory Visit: Payer: Self-pay

## 2021-06-03 ENCOUNTER — Other Ambulatory Visit: Payer: Self-pay | Admitting: *Deleted

## 2021-06-03 ENCOUNTER — Inpatient Hospital Stay: Payer: Medicare Other

## 2021-06-19 ENCOUNTER — Encounter: Payer: Medicare Other | Admitting: Endocrinology

## 2021-06-19 NOTE — Progress Notes (Signed)
This encounter was created in error - please disregard.

## 2021-06-24 ENCOUNTER — Telehealth: Payer: Self-pay | Admitting: Hematology

## 2021-06-24 ENCOUNTER — Encounter: Payer: Self-pay | Admitting: Cardiology

## 2021-06-24 ENCOUNTER — Telehealth: Payer: Self-pay | Admitting: Cardiology

## 2021-06-24 ENCOUNTER — Ambulatory Visit (INDEPENDENT_AMBULATORY_CARE_PROVIDER_SITE_OTHER): Payer: Medicare Other | Admitting: Cardiology

## 2021-06-24 VITALS — BP 122/70 | HR 80 | Ht 63.0 in | Wt 130.0 lb

## 2021-06-24 DIAGNOSIS — I5032 Chronic diastolic (congestive) heart failure: Secondary | ICD-10-CM | POA: Diagnosis not present

## 2021-06-24 DIAGNOSIS — I3139 Other pericardial effusion (noninflammatory): Secondary | ICD-10-CM

## 2021-06-24 DIAGNOSIS — I272 Pulmonary hypertension, unspecified: Secondary | ICD-10-CM

## 2021-06-24 DIAGNOSIS — I350 Nonrheumatic aortic (valve) stenosis: Secondary | ICD-10-CM

## 2021-06-24 DIAGNOSIS — I10 Essential (primary) hypertension: Secondary | ICD-10-CM

## 2021-06-24 NOTE — Progress Notes (Signed)
? ? ?Date:  06/24/2021  ? ?ID:  [name], DOB 1941-01-05, MRN [id number] ? ?PCP:  Mike Craze, DO  ?Cardiologist:  Fransico Him, MD  ?Electrophysiologist:  None  ? ?Chief Complaint:  Chronic diastolic CHF, HTN, HLD ? ?History of Present Illness:   ? ?[name] is a 81 y.o. female  with history of Osler-Weber-Rendu's disease, gastric AVMs, chronic iron deficiency anemia requiring frequent transfusions, hypertension, chronic diastolic CHF, HLD, GERD, type 2 diabetes mellitus.  2D echo 03/2017 showed  normal LVEF with mild LVH and grade 2 DD moderate pulmonary hypertension PASP of 57 mmHg. ? ?She is here today for followup and is doing well.  She denies any chest pain or pressure, SOB, DOE, PND, orthopnea, dizziness, palpitations or syncope. Occasionally she will have some mild LE edema as well as some burning over her shins. She is compliant with her meds and is tolerating meds with no SE.    ?  ?Prior CV studies:   ?The following studies were reviewed today: ? ?2D echo ? ?Past Medical History:  ?Diagnosis Date  ? Chronic anemia   ? Chronic diastolic CHF (congestive heart failure) ([place]) 10/03/2013  ? Chronic GI bleeding   ? [name] 11/29/2014  ? Family history of anesthesia complication   ? "niece has a hard time coming out" (09/15/2012)  ? Frequent nosebleeds   ? chronic  ? Gastric AV malformation   ? [name] 11/29/2014  ? GERD (gastroesophageal reflux disease)   ? Heart murmur 04/01/2017  ? 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 History of blood transfusion "several"  ? History of epistaxis   ? HTN (hypertension), benign 03/02/2012  ? Hyperlipidemia   ? Iron deficiency anemia   ? chronic infusions"  ? Lichen planus   ? Both lower extremities  ? 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 [name] 11/29/2014  ? Overgrown toenails 03/18/2017  ? Pneumonia 1990's X 2  ? Pulmonary HTN ([place]) 04/01/2017  ? PASP 65mHg on echo 09/2016 and 494mg by echo 2019  ? Seizures ([place]8/2016  ? Symptomatic anemia 11/29/2014  ? Telangiectasia   ?  Gastric   ? Type II diabetes mellitus ([place]  ? insulin requiring.  ? ?Past Surgical History:  ?Procedure Laterality Date  ? CATARACT EXTRACTION    ? "I think it was just one eye"  ? ESOPHAGOGASTRODUODENOSCOPY  02/26/2011  ? Procedure: ESOPHAGOGASTRODUODENOSCOPY (EGD);  Surgeon: [name];  [facility];  Service: Endoscopy;  Laterality: N/A;  ? ESOPHAGOGASTRODUODENOSCOPY N/A 11/08/2012  ? Procedure: ESOPHAGOGASTRODUODENOSCOPY (EGD);  Surgeon: [name];  [facility];  Service: Endoscopy;  Laterality: N/A;  ? ESOPHAGOGASTRODUODENOSCOPY N/A 10/04/2013  ? Procedure: ESOPHAGOGASTRODUODENOSCOPY (EGD);  Surgeon: [name];  [facility];  Service: Endoscopy;  Laterality: N/A;  with APC on stand-by  ? ESOPHAGOGASTRODUODENOSCOPY N/A 07/06/2014  ? Procedure: ESOPHAGOGASTRODUODENOSCOPY (EGD);  Surgeon: [name];  [facility];  Service: Endoscopy;  Laterality: N/A;  ? ESOPHAGOGASTRODUODENOSCOPY N/A 09/05/2014  ? Procedure: ESOPHAGOGASTRODUODENOSCOPY (EGD);  Surgeon: JaLaurence SpatesMD;  [facility];  Service: Endoscopy;  Laterality: N/A;  APC on standby to control bleeding  ? ESOPHAGOGASTRODUODENOSCOPY N/A 11/29/2014  ? Procedure: ESOPHAGOGASTRODUODENOSCOPY (EGD);  Surgeon: [name];  [facility];  Service: Endoscopy;  Laterality: N/A;  ? ESOPHAGOGASTRODUODENOSCOPY N/A 09/28/2015  ? Procedure: ESOPHAGOGASTRODUODENOSCOPY (EGD);  Surgeon: [name];  [facility];  Service: Endoscopy;  Laterality: N/A;  ? ESOPHAGOGASTRODUODENOSCOPY (EGD)  WITH PROPOFOL N/A 12/04/2017  ? Procedure: ESOPHAGOGASTRODUODENOSCOPY (EGD) WITH PROPOFOL;  Surgeon: [name], MD;  Location: [place];  Service: Endoscopy;  Laterality: N/A;  ? ESOPHAGOGASTRODUODENOSCOPY (EGD) WITH PROPOFOL N/A 12/24/2020  ? Procedure: ESOPHAGOGASTRODUODENOSCOPY (EGD) WITH PROPOFOL;  Surgeon: Arta Silence, MD;  [facility];   Service: Endoscopy;  Laterality: N/A;  ? ESOPHAGOGASTRODUODENOSCOPY (EGD) WITH PROPOFOL N/A 01/19/2021  ? Procedure: ESOPHAGOGASTRODUODENOSCOPY (EGD) WITH PROPOFOL;  Surgeon: [name], MD;  Location: [place];  Service: Endoscopy;  Laterality: N/A;  ? ESOPHAGOGASTRODUODENOSCOPY (EGD) WITH PROPOFOL N/A 02/06/2021  ? Procedure: ESOPHAGOGASTRODUODENOSCOPY (EGD) WITH PROPOFOL;  Surgeon: Ronnette Juniper, MD;  Location: [place];  Service: Gastroenterology;  Laterality: N/A;  ? ESOPHAGOGASTRODUODENOSCOPY ENDOSCOPY  08/19/2006  ? with laser treatment  ? HEMOSTASIS CLIP PLACEMENT  12/24/2020  ? Procedure: HEMOSTASIS CLIP PLACEMENT;  Surgeon: Arta Silence, MD;  Location[facility]COPY;  Service: Endoscopy;;  ? HEMOSTASIS CLIP PLACEMENT  01/19/2021  ? Procedure: HEMOSTASIS CLIP PLACEMENT;  Surgeon: [name], MD;  Location: [place];  Service: Endoscopy;;  ? HOT HEMOSTASIS N/A 07/06/2014  ? Procedure: HOT HEMOSTASIS (ARGON PLASMA COAGULATION/BICAP);  Surgeon: Clarene Essex, MD;  [name];  Service: Endoscopy;  Laterality: N/A;  ? HOT HEMOSTASIS N/A 09/28/2015  ? Procedure: HOT HEMOSTASIS (ARGON PLASMA COAGULATION/BICAP);  Surgeon: Clarene Essex, MD;  [facility];  Service: Endoscopy;  Laterality: N/A;  ? HOT HEMOSTASIS N/A 12/04/2017  ? Procedure: HOT HEMOSTASIS (ARGON PLASMA COAGULATION/BICAP);  Surgeon: [name], MD;  Location: [place];  Service: Endoscopy;  Laterality: N/A;  ? IR ANGIOGRAM SELECTIVE EACH ADDITIONAL VESSEL  02/05/2021  ? IR ANGIOGRAM VISCERAL SELECTIVE  02/05/2021  ? IR EMBO ART  VEN HEMORR LYMPH EXTRAV  INC GUIDE ROADMAPPING  02/05/2021  ? IR US GUIDE VASC ACCESS RIGHT  02/05/2021  ? NASAL HEMORRHAGE CONTROL    ? "for bleeding"   ? SAVORY DILATION  02/26/2011  ? Procedure: SAVORY DILATION;  Surgeon: [name], MD;  [name];  Service: Endoscopy;  Laterality: N/A;  c-arm needed  ? SCLEROTHERAPY  12/24/2020  ? Procedure: SCLEROTHERAPY;   Surgeon: Arta Silence, MD;  [facility];  Service: Endoscopy;;  ? SCLEROTHERAPY  01/19/2021  ? Procedure: SCLEROTHERAPY;  Surgeon: [name], MD;  Location[facility] END[facility];  Service: Endoscopy;;  ? SUBMUCOSAL INJECTION  12/04/2017  ? Procedure: SUBMUCOSAL INJECTION;  Surgeon: [name], MD;  Location: [place];  Service: Endoscopy;;  ?  ? ?Current Meds  ?Medication Sig  ? ACCU-CHEK GUIDE test strip USE AS INSTRUCTED TO CHECK BLOOD SUGAR 3 TIMES DAILY.  ? Accu-Chek Softclix Lancets lancets 1 each by Other route 3 (three) times daily. Use as instructed to check blood sugar 3 times per day dx code E11.65  ? acetaminophen (TYLENOL) 325 MG tablet Take 650 mg by mouth every 4 (four) hours as needed for moderate pain.  ? amLODipine (NORVASC) 2.5 MG tablet Take 1 tablet (2.5 mg total) by mouth daily.  ? Continuous Blood Gluc Sensor (FREESTYLE LIBRE 2 SENSOR) MISC 2 Devices by Does not apply route every 14 (fourteen) days.  ? diclofenac Sodium (VOLTAREN) 1 % GEL Apply 2 g topically 4 (four) times daily.  ? Dulaglutide (TRULICITY) 1.5 ZT/2.4PY SOPN Inject 1.5 mg into the skin every Thursday.  ? gabapentin (NEURONTIN) 100 MG capsule Take 1 capsule (100 mg total) by mouth 2 (two) times daily.  ? insulin lispro (HUMALOG KWIKPEN) 100 UNIT/ML KwikPen Inject 2 Units into the skin 3 (three) times daily.  ?  Insulin Lispro Prot & Lispro (HUMALOG MIX 75/25 KWIKPEN) (75-25) 100 UNIT/ML Kwikpen Inject 12 Units into the skin every morning.  ? lactulose (CEPHULAC) 10 g packet Take 1 packet (10 g total) by mouth daily.  ? LANTUS SOLOSTAR 100 UNIT/ML Solostar Pen Inject 10 Units into the skin at bedtime.  ? levETIRAcetam (KEPPRA) 500 MG tablet Take 1 tablet (500 mg total) by mouth 2 (two) times daily.  ? memantine (NAMENDA) 10 MG tablet TAKE 1 TABLET BY MOUTH TWICE DAILY  ? mirtazapine (REMERON) 7.5 MG tablet Take 7.5 mg by mouth at bedtime.  ? Misc Natural Products (IMMUNE FORMULA PO) Take 1 tablet by mouth  daily.  ? Multiple Vitamins-Minerals (MULTI FOR HER 50+) TABS Take 1 tablet by mouth daily with breakfast.  ? NON FORMULARY See admin instructions. Compression Stockings apply in the morning and remove at b

## 2021-06-24 NOTE — Telephone Encounter (Signed)
Rescheduled missed appointment per 5/16 secure chat. Patient's niece is aware of changes. ?

## 2021-06-24 NOTE — Telephone Encounter (Signed)
Spoke with the nurse Mariam who has questions about a form that Dr. Radford Pax filled out for the patient. I advised that I did not have a copy of the piece of paper that Dr. Radford Pax filled but did advise on the testing that Dr. Radford Pax ordered today. Nurse would like clarification on whether they need to set up these test. I advised her that we are ordering and setting up the test. She verbalized understanding.  ?

## 2021-06-24 NOTE — Patient Instructions (Signed)
Medication Instructions:  ?Your physician recommends that you continue on your current medications as directed. Please refer to the Current Medication list given to you today. ? ?*If you need a refill on your cardiac medications before your next appointment, please call your pharmacy* ? ?Lab Work: ?TODAY: BMET, Sed Rate, CRP, ANA, and RF ?If you have labs (blood work) drawn today and your tests are completely normal, you will receive your results only by: ?MyChart Message (if you have MyChart) OR ?A paper copy in the mail ?If you have any lab test that is abnormal or we need to change your treatment, we will call you to review the results. ? ? ?Testing/Procedures: ?Your physician has recommended that you have a sleep study. This test records several body functions during sleep, including: brain activity, eye movement, oxygen and carbon dioxide blood levels, heart rate and rhythm, breathing rate and rhythm, the flow of air through your mouth and nose, snoring, body muscle movements, and chest and belly movement. ? ?Your physician has recommended that you have a pulmonary function test. Pulmonary Function Tests are a group of tests that measure how well air moves in and out of your lungs. ? ?Your physician has recommended that you that you have a VQ (ventilation-perfusion) scan. ? ? ?Follow-Up: ?At [facility], you and your health needs are our priority.  As part of our continuing mission to provide you with exceptional heart care, we have created designated Provider Care Teams.  These Care Teams include your primary Cardiologist (physician) and Advanced Practice Providers (APPs -  Physician Assistants and Nurse Practitioners) who all work together to provide you with the care you need, when you need it. ? ?Your next appointment:   ?6 month(s) ? ?The format for your next appointment:   ?In Person ? ?Provider:   ?Fransico Him, MD   ? ?Important Information About Sugar ? ? ? ? ?  ?

## 2021-06-24 NOTE — Telephone Encounter (Signed)
Prentiss Bells, a nurse with [name] is calling to get clarification on some things that were discussed with the patient at her appointment today with Dr. Radford Pax. Please advise.  ?

## 2021-06-24 NOTE — Addendum Note (Signed)
Addended by: Antonieta Iba on: 06/24/2021 02:51 PM ? ? Modules accepted: Orders ? ?

## 2021-06-25 ENCOUNTER — Encounter: Payer: Self-pay | Admitting: Hematology

## 2021-06-25 ENCOUNTER — Other Ambulatory Visit: Payer: Self-pay | Admitting: Internal Medicine

## 2021-06-25 DIAGNOSIS — I272 Pulmonary hypertension, unspecified: Secondary | ICD-10-CM

## 2021-06-25 LAB — BASIC METABOLIC PANEL
BUN/Creatinine Ratio: 17 (ref 12–28)
BUN: 14 mg/dL (ref 8–27)
CO2: 24 mmol/L (ref 20–29)
Calcium: 9.4 mg/dL (ref 8.7–10.3)
Chloride: 106 mmol/L (ref 96–106)
Creatinine, Ser: 0.83 mg/dL (ref 0.57–1.00)
Glucose: 165 mg/dL — ABNORMAL HIGH (ref 70–99)
Potassium: 4.7 mmol/L (ref 3.5–5.2)
Sodium: 142 mmol/L (ref 134–144)
eGFR: 71 mL/min/{1.73_m2} (ref >59)

## 2021-06-25 LAB — SEDIMENTATION RATE: Sed Rate: 59 mm/hr — ABNORMAL HIGH (ref 0–40)

## 2021-06-25 LAB — ANA: Anti Nuclear Antibody (ANA): NEGATIVE

## 2021-06-25 LAB — RHEUMATOID FACTOR: Rheumatoid fact SerPl-aCnc: 11.1 IU/mL (ref <14.0)

## 2021-06-25 LAB — C-REACTIVE PROTEIN: CRP: 21 mg/L — ABNORMAL HIGH (ref 0–10)

## 2021-06-25 NOTE — Progress Notes (Signed)
Elevated CRP and sed rate.  Referral placed to rheumatology to rule out autoimmune mediated pulmonary HTN ?

## 2021-06-26 ENCOUNTER — Ambulatory Visit (INDEPENDENT_AMBULATORY_CARE_PROVIDER_SITE_OTHER): Payer: Medicare Other | Admitting: Internal Medicine

## 2021-06-26 ENCOUNTER — Telehealth: Payer: Self-pay | Admitting: *Deleted

## 2021-06-26 DIAGNOSIS — I10 Essential (primary) hypertension: Secondary | ICD-10-CM

## 2021-06-26 DIAGNOSIS — I350 Nonrheumatic aortic (valve) stenosis: Secondary | ICD-10-CM

## 2021-06-26 DIAGNOSIS — I5032 Chronic diastolic (congestive) heart failure: Secondary | ICD-10-CM

## 2021-06-26 DIAGNOSIS — I3139 Other pericardial effusion (noninflammatory): Secondary | ICD-10-CM

## 2021-06-26 DIAGNOSIS — I272 Pulmonary hypertension, unspecified: Secondary | ICD-10-CM

## 2021-06-26 LAB — PULMONARY FUNCTION TEST
FEF 25-75 Pre: 0.63 L/sec
FEF2575-%Pred-Pre: 52 %
FEV1-%Pred-Pre: 51 %
FEV1-Pre: 0.75 L
FEV1FVC-%Pred-Pre: 98 %
FEV6-%Pred-Pre: 56 %
FEV6-Pre: 1.01 L
FEV6FVC-%Pred-Pre: 104 %
FVC-%Pred-Pre: 53 %
FVC-Pre: 1.01 L
Pre FEV1/FVC ratio: 74 %
Pre FEV6/FVC Ratio: 100 %

## 2021-06-26 NOTE — Telephone Encounter (Signed)
Prior Authorization for PSG sent to [facility] via web portal. NO PA REQUIRED

## 2021-06-26 NOTE — Progress Notes (Signed)
Attempted Full PFT Today. Pt unable to perform ATS Standard on Spirometry and DLCO received error code IVC < 90%.

## 2021-06-26 NOTE — Patient Instructions (Signed)
Attempted Full PFT Today. Pt unable to perform ATS Standard on Spirometry and DLCO received error code IVC < 90%.

## 2021-06-27 ENCOUNTER — Telehealth: Payer: Self-pay

## 2021-06-27 DIAGNOSIS — J984 Other disorders of lung: Secondary | ICD-10-CM

## 2021-06-27 NOTE — Telephone Encounter (Signed)
Referral has been placed. 

## 2021-06-27 NOTE — Telephone Encounter (Signed)
-----   Message from [name], MD sent at 06/26/2021  4:55 PM EDT ----- PFTs showed severe OSA and restriction - please get patient in with Dr. Jamey Reas with [facility]

## 2021-06-30 ENCOUNTER — Other Ambulatory Visit: Payer: Self-pay | Admitting: Hematology

## 2021-07-01 ENCOUNTER — Inpatient Hospital Stay (HOSPITAL_BASED_OUTPATIENT_CLINIC_OR_DEPARTMENT_OTHER): Payer: Medicare Other | Admitting: Hematology

## 2021-07-01 ENCOUNTER — Inpatient Hospital Stay: Payer: Medicare Other

## 2021-07-01 ENCOUNTER — Other Ambulatory Visit: Payer: Self-pay

## 2021-07-01 ENCOUNTER — Encounter: Payer: Self-pay | Admitting: Hematology

## 2021-07-01 ENCOUNTER — Inpatient Hospital Stay: Payer: Medicare Other | Attending: Hematology

## 2021-07-01 VITALS — BP 124/67 | HR 77 | Temp 99.3°F | Resp 18 | Wt 121.8 lb

## 2021-07-01 DIAGNOSIS — Z95828 Presence of other vascular implants and grafts: Secondary | ICD-10-CM

## 2021-07-01 DIAGNOSIS — I130 Hypertensive heart and chronic kidney disease with heart failure and stage 1 through stage 4 chronic kidney disease, or unspecified chronic kidney disease: Secondary | ICD-10-CM | POA: Insufficient documentation

## 2021-07-01 DIAGNOSIS — D509 Iron deficiency anemia, unspecified: Secondary | ICD-10-CM

## 2021-07-01 DIAGNOSIS — Z5111 Encounter for antineoplastic chemotherapy: Secondary | ICD-10-CM | POA: Diagnosis present

## 2021-07-01 DIAGNOSIS — I5032 Chronic diastolic (congestive) heart failure: Secondary | ICD-10-CM | POA: Insufficient documentation

## 2021-07-01 DIAGNOSIS — K31819 Angiodysplasia of stomach and duodenum without bleeding: Secondary | ICD-10-CM

## 2021-07-01 DIAGNOSIS — E1122 Type 2 diabetes mellitus with diabetic chronic kidney disease: Secondary | ICD-10-CM | POA: Diagnosis not present

## 2021-07-01 DIAGNOSIS — N183 Chronic kidney disease, stage 3 unspecified: Secondary | ICD-10-CM | POA: Insufficient documentation

## 2021-07-01 DIAGNOSIS — I780 Hereditary hemorrhagic telangiectasia: Secondary | ICD-10-CM

## 2021-07-01 DIAGNOSIS — C19 Malignant neoplasm of rectosigmoid junction: Secondary | ICD-10-CM

## 2021-07-01 DIAGNOSIS — K31811 Angiodysplasia of stomach and duodenum with bleeding: Secondary | ICD-10-CM

## 2021-07-01 DIAGNOSIS — D500 Iron deficiency anemia secondary to blood loss (chronic): Secondary | ICD-10-CM

## 2021-07-01 LAB — TOTAL PROTEIN, URINE DIPSTICK: Protein, ur: <30 mg/dL — AB

## 2021-07-01 LAB — CMP (CANCER CENTER ONLY)
ALT: 10 U/L (ref 0–44)
AST: 23 U/L (ref 15–41)
Albumin: 2.9 g/dL — ABNORMAL LOW (ref 3.5–5.0)
Alkaline Phosphatase: 105 U/L (ref 38–126)
Anion gap: 5 (ref 5–15)
BUN: 13 mg/dL (ref 8–23)
CO2: 29 mmol/L (ref 22–32)
Calcium: 9.3 mg/dL (ref 8.9–10.3)
Chloride: 110 mmol/L (ref 98–111)
Creatinine: 0.93 mg/dL (ref 0.44–1.00)
GFR, Estimated: >60 mL/min (ref >60)
Glucose, Bld: 172 mg/dL — ABNORMAL HIGH (ref 70–99)
Potassium: 4.1 mmol/L (ref 3.5–5.1)
Sodium: 144 mmol/L (ref 135–145)
Total Bilirubin: 0.6 mg/dL (ref 0.3–1.2)
Total Protein: 6.2 g/dL — ABNORMAL LOW (ref 6.5–8.1)

## 2021-07-01 LAB — CBC WITH DIFFERENTIAL (CANCER CENTER ONLY)
Abs Immature Granulocytes: 0.02 10*3/uL (ref 0.00–0.07)
Basophils Absolute: 0 10*3/uL (ref 0.0–0.1)
Basophils Relative: 1 %
Eosinophils Absolute: 0.4 10*3/uL (ref 0.0–0.5)
Eosinophils Relative: 6 %
HCT: 30.2 % — ABNORMAL LOW (ref 36.0–46.0)
Hemoglobin: 9.4 g/dL — ABNORMAL LOW (ref 12.0–15.0)
Immature Granulocytes: 0 %
Lymphocytes Relative: 6 %
Lymphs Abs: 0.4 10*3/uL — ABNORMAL LOW (ref 0.7–4.0)
MCH: 26.3 pg (ref 26.0–34.0)
MCHC: 31.1 g/dL (ref 30.0–36.0)
MCV: 84.6 fL (ref 80.0–100.0)
Monocytes Absolute: 0.4 10*3/uL (ref 0.1–1.0)
Monocytes Relative: 6 %
Neutro Abs: 5.3 10*3/uL (ref 1.7–7.7)
Neutrophils Relative %: 81 %
Platelet Count: 217 10*3/uL (ref 150–400)
RBC: 3.57 MIL/uL — ABNORMAL LOW (ref 3.87–5.11)
RDW: 16.1 % — ABNORMAL HIGH (ref 11.5–15.5)
WBC Count: 6.5 10*3/uL (ref 4.0–10.5)
nRBC: 0 % (ref 0.0–0.2)

## 2021-07-01 LAB — SAMPLE TO BLOOD BANK

## 2021-07-01 LAB — FERRITIN: Ferritin: 13 ng/mL (ref 11–307)

## 2021-07-01 MED ORDER — SODIUM CHLORIDE 0.9 % IV SOLN
4.6000 mg/kg | Freq: Once | INTRAVENOUS | Status: AC
  Administered 2021-07-01: 300 mg via INTRAVENOUS
  Filled 2021-07-01: qty 12

## 2021-07-01 MED ORDER — SODIUM CHLORIDE 0.9% FLUSH
10.0000 mL | INTRAVENOUS | Status: DC | PRN
  Administered 2021-07-01: 10 mL

## 2021-07-01 MED ORDER — ALTEPLASE 2 MG IJ SOLR
2.0000 mg | Freq: Once | INTRAMUSCULAR | Status: AC | PRN
  Administered 2021-07-01: 2 mg
  Filled 2021-07-01: qty 2

## 2021-07-01 MED ORDER — SODIUM CHLORIDE 0.9 % IV SOLN: Freq: Once | INTRAVENOUS | Status: AC

## 2021-07-01 MED ORDER — SODIUM CHLORIDE 0.9 % IV SOLN
510.0000 mg | Freq: Once | INTRAVENOUS | Status: AC
  Administered 2021-07-01: 510 mg via INTRAVENOUS
  Filled 2021-07-01: qty 510

## 2021-07-01 MED ORDER — HEPARIN SOD (PORK) LOCK FLUSH 100 UNIT/ML IV SOLN
500.0000 [IU] | Freq: Once | INTRAVENOUS | Status: AC | PRN
  Administered 2021-07-01: 500 [IU]

## 2021-07-01 NOTE — Patient Instructions (Signed)
[facility]  Discharge Instructions: Thank you for choosing [facility] to provide your oncology and hematology care.   If you have a lab appointment with the [facility], please go directly to the [facility] and check in at the registration area.   Wear comfortable clothing and clothing appropriate for easy access to any Portacath or PICC line.   We strive to give you quality time with your provider. You may need to reschedule your appointment if you arrive late (15 or more minutes).  Arriving late affects you and other patients whose appointments are after yours.  Also, if you miss three or more appointments without notifying the office, you may be dismissed from the clinic at the provider's discretion.      For prescription refill requests, have your pharmacy contact our office and allow 72 hours for refills to be completed.    Today you received the following chemotherapy and/or immunotherapy agents: Bevacizumab.      To help prevent nausea and vomiting after your treatment, we encourage you to take your nausea medication as directed.  BELOW ARE SYMPTOMS THAT SHOULD BE REPORTED IMMEDIATELY: *FEVER GREATER THAN 100.4 F (38 C) OR HIGHER *CHILLS OR SWEATING *NAUSEA AND VOMITING THAT IS NOT CONTROLLED WITH YOUR NAUSEA MEDICATION *UNUSUAL SHORTNESS OF BREATH *UNUSUAL BRUISING OR BLEEDING *URINARY PROBLEMS (pain or burning when urinating, or frequent urination) *BOWEL PROBLEMS (unusual diarrhea, constipation, pain near the anus) TENDERNESS IN MOUTH AND THROAT WITH OR WITHOUT PRESENCE OF ULCERS (sore throat, sores in mouth, or a toothache) UNUSUAL RASH, SWELLING OR PAIN  UNUSUAL VAGINAL DISCHARGE OR ITCHING   Items with * indicate a potential emergency and should be followed up as soon as possible or go to the Emergency Department if any problems should occur.  Please show the CHEMOTHERAPY ALERT CARD or IMMUNOTHERAPY ALERT CARD at check-in  to the Emergency Department and triage nurse.  Should you have questions after your visit or need to cancel or reschedule your appointment, please contact [facility]  Dept: [phone]  and follow the prompts.  Office hours are 8:00 a.m. to 4:30 p.m. Monday - Friday. Please note that voicemails left after 4:00 p.m. may not be returned until the following business day.  We are closed weekends and major holidays. You have access to a nurse at all times for urgent questions. Please call the main number to the clinic Dept: [phone] and follow the prompts.   For any non-urgent questions, you may also contact your provider using MyChart. We now offer e-Visits for anyone 21 and older to request care online for non-urgent symptoms. For details visit [email or web address].   Also download the MyChart app! Go to the app store, search "MyChart", open the app, select [facility], and log in with your MyChart username and password.  Due to Covid, a mask is required upon entering the hospital/clinic. If you do not have a mask, one will be given to you upon arrival. For doctor visits, patients may have 1 support person aged 21 or older with them. For treatment visits, patients cannot have anyone with them due to current Covid guidelines and our immunocompromised population.   Ferumoxytol Injection What is this medication? FERUMOXYTOL (FER ue MOX i tol) treats low levels of iron in your body (iron deficiency anemia). Iron is a mineral that plays an important role in making red blood cells, which carry oxygen from your lungs to the rest of your body.  This medicine may be used for other purposes; ask your health care provider or pharmacist if you have questions. COMMON BRAND NAME(S): Feraheme What should I tell my care team before I take this medication? They need to know if you have any of these conditions: Anemia not caused by low iron levels High levels of iron in the  blood Magnetic resonance imaging (MRI) test scheduled An unusual or allergic reaction to iron, other medications, foods, dyes, or preservatives Pregnant or trying to get pregnant Breast-feeding How should I use this medication? This medication is for injection into a vein. It is given in a hospital or clinic setting. Talk to your care team the use of this medication in children. Special care may be needed. Overdosage: If you think you have taken too much of this medicine contact a poison control center or emergency room at once. NOTE: This medicine is only for you. Do not share this medicine with others. What if I miss a dose? It is important not to miss your dose. Call your care team if you are unable to keep an appointment. What may interact with this medication? Other iron products This list may not describe all possible interactions. Give your health care provider a list of all the medicines, herbs, non-prescription drugs, or dietary supplements you use. Also tell them if you smoke, drink alcohol, or use illegal drugs. Some items may interact with your medicine. What should I watch for while using this medication? Visit your care team regularly. Tell your care team if your symptoms do not start to get better or if they get worse. You may need blood work done while you are taking this medication. You may need to follow a special diet. Talk to your care team. Foods that contain iron include: whole grains/cereals, dried fruits, beans, or peas, leafy green vegetables, and organ meats (liver, kidney). What side effects may I notice from receiving this medication? Side effects that you should report to your care team as soon as possible: Allergic reactions--skin rash, itching, hives, swelling of the face, lips, tongue, or throat Low blood pressure--dizziness, feeling faint or lightheaded, blurry vision Shortness of breath Side effects that usually do not require medical attention (report to your  care team if they continue or are bothersome): Flushing Headache Joint pain Muscle pain Nausea Pain, redness, or irritation at injection site This list may not describe all possible side effects. Call your doctor for medical advice about side effects. You may report side effects to FDA at 1-800-FDA-1088. Where should I keep my medication? This medication is given in a hospital or clinic and will not be stored at home. NOTE: This sheet is a summary. It may not cover all possible information. If you have questions about this medicine, talk to your doctor, pharmacist, or health care provider.  2023 Elsevier/Gold Standard (2020-06-21 00:00:00)

## 2021-07-01 NOTE — Progress Notes (Signed)
[facility]   [phone] [phone]   Clinic Follow up Note   Patient Care Team: Mike Craze, DO as PCP - General (Internal Medicine) [name], MD as PCP - Cardiology (Cardiology) Truitt Merle, MD as Consulting Physician (Hematology) Elayne Snare, MD as Consulting Physician (Endocrinology)  Date of Service:  07/01/2021  CHIEF COMPLAINT: f/u of anemia due to chronic blood loss, HHT  CURRENT THERAPY:  1. Iv feraheme as needed if ferritin<100, latest on 11/04/20 2. Avastin '5mg'$ /kg, starting 05/09/18.   -given intermittently due to protenuria 3. Blood transfusion if Hg<8.0  ASSESSMENT & PLAN:  [name] is a 81 y.o. female with   1. Hereditary hemorrhagic telangiectasia (HHT), with epistaxis and frequent GI bleeding -She receives blood transfusion as needed, but has not needed since 04/2018 when she started Beva. -She is on Avastin intermittently/PRN due to high proteinuria. -anemia worsened off Avastin. She was given her most recent dose on 01/17/21. Restarted at last visit on 05/05/21. -she required blood transfusion on 04/17/21 for hgb of 6.8 per her SNF. -labs reviewed, hgb 9.4 today. Improved    2. Iron deficiency anemia secondary to #1 -She receives IV Feraheme as needed for Ferritin <100, most recently 11/04/20 -ferritin was 28 on 05/05/2021 -plan to give a dose today and one more dose in a month    3. Dementia  -She takes Aricept and memantine. On Keppra for seizures.  -f/u with neurology.    5. DM, CHF, CKD stage III and HTN secondary to Avastin - followed by Dr. Dwyane Dee -f/u with PCP   6. Proteinuria -This is likely secondary to avastin, but she also has diabetes  -She receives bevacizumab as needed   7. Iron overload?  -her recent liver MRI on 02/04/21 showed signs of iron deposits in liver, spleen and marrow space. -will hold iv feraheme unless ferritin<10    8. Deconditioning  -Patient has been moved to a long-term  nursing facility due to her the care she needs -She has low appetite, not eating well -We reviewed the hospice care when she deteriorates further    Plan -proceed with Bevacizumab and IV Feraheme today -will move all appointments from 6/5 to 6/20 and add IV Feraheme to beva, will change her beva infusion back to every 6 weeks after next dose  -I called her niece [name] to update her and encouraged her to let pt's husband to come in to office visit in future    No problem-specific Assessment & Plan notes found for this encounter.   INTERVAL HISTORY:  [name] is here for a follow up of anemia and HHT. She was last seen by me on 05/05/21. She was seen in the infusion area. She tells me she is living in a SNF. She reports she is very tired and keeps saying she wants to go home and lay down.   All other systems were reviewed with the patient and are negative.  MEDICAL HISTORY:  Past Medical History:  Diagnosis Date   Chronic anemia    Chronic diastolic CHF (congestive heart failure) ([place]) 10/03/2013   Chronic GI bleeding    /notes 11/29/2014   Family history of anesthesia complication    "niece has a hard time coming out" (09/15/2012)   Frequent nosebleeds    chronic   Gastric AV malformation    /notes 11/29/2014   GERD (gastroesophageal reflux disease)    Heart murmur 04/01/2017   Moderate AVSC on echo 09/2016  History of blood transfusion "several"   History of epistaxis    HTN (hypertension), benign 03/02/2012   Hyperlipidemia    Iron deficiency anemia    chronic infusions"   Lichen planus    Both lower extremities   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    SURGICAL  HISTORY: Past Surgical History:  Procedure Laterality Date   CATARACT EXTRACTION     "I think it was just one eye"   ESOPHAGOGASTRODUODENOSCOPY  02/26/2011   Procedure: ESOPHAGOGASTRODUODENOSCOPY (EGD);  Surgeon: [name];  [facility];  Service: Endoscopy;  Laterality: N/A;   ESOPHAGOGASTRODUODENOSCOPY N/A 11/08/2012   Procedure: ESOPHAGOGASTRODUODENOSCOPY (EGD);  Surgeon: [name];  [facility];  Service: Endoscopy;  Laterality: N/A;   ESOPHAGOGASTRODUODENOSCOPY N/A 10/04/2013   Procedure: ESOPHAGOGASTRODUODENOSCOPY (EGD);  Surgeon: [name];  [facility];  Service: Endoscopy;  Laterality: N/A;  with APC on stand-by   ESOPHAGOGASTRODUODENOSCOPY N/A 07/06/2014   Procedure: ESOPHAGOGASTRODUODENOSCOPY (EGD);  Surgeon: [name];  [facility];  Service: Endoscopy;  Laterality: N/A;   ESOPHAGOGASTRODUODENOSCOPY N/A 09/05/2014   Procedure: ESOPHAGOGASTRODUODENOSCOPY (EGD);  Surgeon: JaLaurence SpatesMD;  [facility];  Service: Endoscopy;  Laterality: N/A;  APC on standby to control bleeding   ESOPHAGOGASTRODUODENOSCOPY N/A 11/29/2014   Procedure: ESOPHAGOGASTRODUODENOSCOPY (EGD);  Surgeon: [name];  [facility];  Service: Endoscopy;  Laterality: N/A;   ESOPHAGOGASTRODUODENOSCOPY N/A 09/28/2015   Procedure: ESOPHAGOGASTRODUODENOSCOPY (EGD);  Surgeon: [name];  [facility];  Service: Endoscopy;  Laterality: N/A;   ESOPHAGOGASTRODUODENOSCOPY (EGD) WITH PROPOFOL N/A 12/04/2017   Procedure: ESOPHAGOGASTRODUODENOSCOPY (EGD) WITH PROPOFOL;  Surgeon: [name];  [facility] Service: Endoscopy;  Laterality: N/A;   ESOPHAGOGASTRODUODENOSCOPY (EGD) WITH PROPOFOL N/A 12/24/2020   Procedure: ESOPHAGOGASTRODUODENOSCOPY (EGD) WITH PROPOFOL;  Surgeon: OuArta SilenceMD;  [facility] Service: Endoscopy;  Laterality: N/A;   ESOPHAGOGASTRODUODENOSCOPY  (EGD) WITH PROPOFOL N/A 01/19/2021   Procedure: ESOPHAGOGASTRODUODENOSCOPY (EGD) WITH PROPOFOL;  Surgeon: [name];  [facility]: Endoscopy;  Laterality: N/A;   ESOPHAGOGASTRODUODENOSCOPY (EGD) WITH PROPOFOL N/A 02/06/2021   Procedure: ESOPHAGOGASTRODUODENOSCOPY (EGD) WITH PROPOFOL;  Surgeon: KaRonnette JuniperMD;  [facility] Service: Gastroenterology;  Laterality: N/A;   ESOPHAGOGASTRODUODENOSCOPY ENDOSCOPY  08/19/2006   with laser treatment   HEMOSTASIS CLIP PLACEMENT  12/24/2020   Procedure: HEMOSTASIS CLIP PLACEMENT;  Surgeon: OuArta SilenceMD;  [facility];  Service: Endoscopy;;   HEMOSTASIS CLIP PLACEMENT  01/19/2021   Procedure: HEMOSTASIS CLIP PLACEMENT;  Surgeon: [name];  [facility]: Endoscopy;;   HOT HEMOSTASIS N/A 07/06/2014   Procedure: HOT HEMOSTASIS (ARGON PLASMA COAGULATION/BICAP);  Surgeon: [name];  [facility];  Service: Endoscopy;  Laterality: N/A;   HOT HEMOSTASIS N/A 09/28/2015   Procedure: HOT HEMOSTASIS (ARGON PLASMA COAGULATION/BICAP);  Surgeon: [name];  [facility];  Service: Endoscopy;  Laterality: N/A;   HOT HEMOSTASIS N/A 12/04/2017   Procedure: HOT HEMOSTASIS (ARGON PLASMA COAGULATION/BICAP);  Surgeon: [name];  [facility]: [facility];  Laterality: N/A;  IR ANGIOGRAM SELECTIVE EACH ADDITIONAL VESSEL  02/05/2021   IR ANGIOGRAM VISCERAL SELECTIVE  02/05/2021   IR EMBO ART  VEN HEMORR LYMPH EXTRAV  INC GUIDE ROADMAPPING  02/05/2021   IR US GUIDE VASC ACCESS RIGHT  02/05/2021   NASAL HEMORRHAGE CONTROL     "for bleeding"    SAVORY DILATION  02/26/2011   Procedure: SAVORY DILATION;  Surgeon: [name], MD;  [name];  Service: Endoscopy;  Laterality: N/A;  c-arm needed   SCLEROTHERAPY  12/24/2020   Procedure: SCLEROTHERAPY;  Surgeon: Arta Silence, MD;  [facility];  Service:  Endoscopy;;   SCLEROTHERAPY  01/19/2021   Procedure: Clide Deutscher;  Surgeon: [name], MD;  [facility];  Service: Endoscopy;;   SUBMUCOSAL INJECTION  12/04/2017   Procedure: SUBMUCOSAL INJECTION;  Surgeon: [name], MD;  [facility];  Service: Endoscopy;;    I have reviewed the social history and family history with the patient and they are unchanged from previous note.  ALLERGIES:  is allergic to aspirin.  MEDICATIONS:  Current Outpatient Medications  Medication Sig Dispense Refill   ACCU-CHEK GUIDE test strip USE AS INSTRUCTED TO CHECK BLOOD SUGAR 3 TIMES DAILY. 100 each 0   Accu-Chek Softclix Lancets lancets 1 each by Other route 3 (three) times daily. Use as instructed to check blood sugar 3 times per day dx code E11.65 100 each 3   acetaminophen (TYLENOL) 325 MG tablet Take 650 mg by mouth every 4 (four) hours as needed for moderate pain.     amLODipine (NORVASC) 2.5 MG tablet Take 1 tablet (2.5 mg total) by mouth daily. 30 tablet 11   Continuous Blood Gluc Sensor (FREESTYLE LIBRE 2 SENSOR) MISC 2 Devices by Does not apply route every 14 (fourteen) days. 2 each 3   diclofenac Sodium (VOLTAREN) 1 % GEL Apply 2 g topically 4 (four) times daily. 100 g 1   Dulaglutide (TRULICITY) 1.5 VQ/2.5ZD SOPN Inject 1.5 mg into the skin every Thursday.     gabapentin (NEURONTIN) 100 MG capsule Take 1 capsule (100 mg total) by mouth 2 (two) times daily.     insulin lispro (HUMALOG KWIKPEN) 100 UNIT/ML KwikPen Inject 2 Units into the skin 3 (three) times daily.     Insulin Lispro Prot & Lispro (HUMALOG MIX 75/25 KWIKPEN) (75-25) 100 UNIT/ML Kwikpen Inject 12 Units into the skin every morning.     lactulose (CEPHULAC) 10 g packet Take 1 packet (10 g total) by mouth daily. 30 each 0   LANTUS SOLOSTAR 100 UNIT/ML Solostar Pen Inject 10 Units into the skin at bedtime.     levETIRAcetam (KEPPRA) 500 MG tablet Take 1 tablet (500 mg total) by mouth 2 (two) times daily.      memantine (NAMENDA) 10 MG tablet TAKE 1 TABLET BY MOUTH TWICE DAILY 60 tablet 10   mirtazapine (REMERON) 7.5 MG tablet Take 7.5 mg by mouth at bedtime.     Misc Natural Products (IMMUNE FORMULA PO) Take 1 tablet by mouth daily.     Multiple Vitamins-Minerals (MULTI FOR HER 50+) TABS Take 1 tablet by mouth daily with breakfast.     NON FORMULARY See admin instructions. Compression Stockings apply in the morning and remove at bedtime     pantoprazole (PROTONIX) 40 MG tablet Take 1 tablet (40 mg total) by mouth 2 (two) times daily. 60 tablet 2   polyethylene glycol powder (GLYCOLAX/MIRALAX) 17 GM/SCOOP powder Take 17 g by mouth daily as needed for mild constipation.  torsemide (DEMADEX) 20 MG tablet Take 1 tablet (20 mg total) by mouth as needed (please take 1 '20mg'$  tablet as needed for weight gain of 2 pounds overnight or 5 pounds in a week). 180 tablet 3   traMADol (ULTRAM) 50 MG tablet Take 50 mg by mouth every 6 (six) hours as needed.     TRULICITY 3 UX/3.2TF SOPN Inject into the skin.     No current facility-administered medications for this visit.   Facility-Administered Medications Ordered in Other Visits  Medication Dose Route Frequency Provider Last Rate Last Admin   bevacizumab-awwb (MVASI) 300 mg in sodium chloride 0.9 % 100 mL chemo infusion  4.6 mg/kg (Treatment Plan Recorded) Intravenous Once Truitt Merle, MD       heparin lock flush 100 unit/mL  500 Units Intracatheter Once PRN Truitt Merle, MD       sodium chloride 0.9 % injection 10 mL  10 mL Intracatheter PRN Truitt Merle, MD       sodium chloride flush (NS) 0.9 % injection 10 mL  10 mL Intracatheter PRN Truitt Merle, MD   10 mL at 08/18/19 1717   sodium chloride flush (NS) 0.9 % injection 10 mL  10 mL Intracatheter PRN Truitt Merle, MD        PHYSICAL EXAMINATION: ECOG PERFORMANCE STATUS: 3 - Symptomatic, >50% confined to bed  There were no vitals filed for this visit. Wt Readings from Last 3 Encounters:  07/01/21 121 lb 12.8 oz  (55.2 kg)  06/24/21 130 lb (59 kg)  04/17/21 120 lb (54.4 kg)     GENERAL:alert, no distress and comfortable SKIN: skin color normal, no rashes or significant lesions EYES: normal, Conjunctiva are pink and non-injected, sclera clear  NEURO: alert & oriented x 3 with fluent speech  LABORATORY DATA:  I have reviewed the data as listed    Latest Ref Rng & Units 07/01/2021    9:19 AM 05/05/2021   11:55 AM 04/17/2021    1:34 PM  CBC  WBC 4.0 - 10.5 K/uL 6.5   7.2     Hemoglobin 12.0 - 15.0 g/dL 9.4   10.7   6.8    Hematocrit 36.0 - 46.0 % 30.2   34.8   20.0    Platelets 150 - 400 K/uL 217   216           Latest Ref Rng & Units 07/01/2021    9:19 AM 06/24/2021    3:32 PM 05/05/2021   11:55 AM  CMP  Glucose 70 - 99 mg/dL 172   165   338    BUN 8 - 23 mg/dL '13   14   17    '$ Creatinine 0.44 - 1.00 mg/dL 0.93   0.83   1.15    Sodium 135 - 145 mmol/L 144   142   142    Potassium 3.5 - 5.1 mmol/L 4.1   4.7   3.3    Chloride 98 - 111 mmol/L 110   106   110    CO2 22 - 32 mmol/L '29   24   25    '$ Calcium 8.9 - 10.3 mg/dL 9.3   9.4   9.1    Total Protein 6.5 - 8.1 g/dL 6.2    5.9    Total Bilirubin 0.3 - 1.2 mg/dL 0.6    0.6    Alkaline Phos 38 - 126 U/L 105    97    AST 15 - 41 U/L 23  21    ALT 0 - 44 U/L 10    9        RADIOGRAPHIC STUDIES: I have personally reviewed the radiological images as listed and agreed with the findings in the report. No results found.    No orders of the defined types were placed in this encounter.  All questions were answered. The patient knows to call the clinic with any problems, questions or concerns. No barriers to learning was detected. The total time spent in the appointment was 30 minutes.     Truitt Merle, MD 07/01/2021   I, Wilburn Mylar, am acting as scribe for Truitt Merle, MD.   I have reviewed the above documentation for accuracy and completeness, and I agree with the above.

## 2021-07-01 NOTE — Progress Notes (Signed)
Per Burr Medico MD, pt to get Feraheme infusion in addition to chemotherapy today.   Per [facility], transportation on the way to pick pt up post infusion.

## 2021-07-02 ENCOUNTER — Telehealth: Payer: Self-pay | Admitting: Hematology

## 2021-07-02 NOTE — Telephone Encounter (Signed)
Scheduled appointment per 05/23 los. Patient aware.

## 2021-07-03 ENCOUNTER — Ambulatory Visit (INDEPENDENT_AMBULATORY_CARE_PROVIDER_SITE_OTHER): Payer: Medicare Other | Admitting: Endocrinology

## 2021-07-03 ENCOUNTER — Encounter: Payer: Self-pay | Admitting: Endocrinology

## 2021-07-03 VITALS — BP 122/70 | HR 76 | Ht 63.0 in | Wt 120.0 lb

## 2021-07-03 DIAGNOSIS — Z794 Long term (current) use of insulin: Secondary | ICD-10-CM

## 2021-07-03 DIAGNOSIS — E1165 Type 2 diabetes mellitus with hyperglycemia: Secondary | ICD-10-CM

## 2021-07-03 LAB — POCT GLYCOSYLATED HEMOGLOBIN (HGB A1C): Hemoglobin A1C: 7.7 % — AB (ref 4.0–5.6)

## 2021-07-03 NOTE — Patient Instructions (Signed)
May have [place] shake if patient not eating a  meal  Please instruct [name] to avoid bringing milkshakes from outside  REDUCE Lantus insulin to 7 units instead of 10  INCREASE Humalog at lunchtime to 4 units and continue 3 units before breakfast and dinner

## 2021-07-03 NOTE — Progress Notes (Signed)
Patient ID: [name], female   DOB: Feb 22, 1940, 81 y.o.   MRN: [id number]           Reason for Appointment: Follow-up for Type 2 Diabetes  Referring PCP: Koleen Distance   History of Present Illness:          Date of diagnosis of type 2 diabetes mellitus: 1988       Background history:   Patient has been on insulin for a few years and she is unclear when this was started, according to Epic she was started on Levemir in 2015 Prior to that likely had been on various oral hypoglycemic agents including metformin, glipizide and Tradjenta Her level of control has been somewhat variable but not consistently poor; her A1c has ranged between 5.6 and 7.6  Recent history:   Her A1c is last 6.2 and now 7.7   INSULIN regimen is: Lantus 10 units at bedtime, Humalog mix 12 units before breakfast and lispro 3 units before each meal  Non-insulin hypoglycemic drugs prescribed: Trulicity 1.5 mg weekly  Current management, blood sugar patterns and problems identified:  She is now getting Lantus in the nursing home in addition to her previous insulin doses and not clear for how long and why  With this her fasting readings are fairly good but has been as low as 67 She does not think she has had hypoglycemia Again blood sugars are highly variable at different times and no consistent pattern is seen Although she has some readings well over 200 at different times  Recently blood sugars may be almost consistently higher at dinnertime Her attendant says that her husband is bringing her milkshakes from [employer] which may make her sugar go up  She is eating better overall but usually in the mornings not eating much at breakfast         Side effects from medications have been: None known     Meal times are:  Breakfast is at variable times between 8 AM-9  Dinner 7 pm             Exercise:  Unable to do any  Glucose monitoring: As below      glucometer:?    Blood Glucose readings    PRE-MEAL  Fasting Lunch Dinner Bedtime Overall  Glucose range: 67-360 76-289 99-330 139-275   Mean/median:        Previously  PRE-MEAL Fasting Lunch Dinner Bedtime Overall  Glucose range: 106-389 130-235 112-246    Mean/median:     ?   POST-MEAL PC Breakfast PC Lunch PC Dinner  Glucose range:   110-353  Mean/median:       Dietician visit, most recent: 11/2018  Weight history:  Wt Readings from Last 3 Encounters:  07/03/21 120 lb (54.4 kg)  07/01/21 121 lb 12.8 oz (55.2 kg)  06/24/21 130 lb (59 kg)    Glycemic control:   Lab Results  Component Value Date   HGBA1C 7.7 (A) 07/03/2021   HGBA1C 6.2 (H) 02/05/2021   HGBA1C 7.8 (A) 11/28/2020   Lab Results  Component Value Date   MICROALBUR 1,289.4 (H) 03/07/2019   LDLCALC 57 01/30/2019   CREATININE 0.93 07/01/2021   Lab Results  Component Value Date   MICRALBCREAT 5 03/28/2018    Lab Results  Component Value Date   FRUCTOSAMINE 404 (H) 10/31/2019   FRUCTOSAMINE 256 06/26/2019    Office Visit on 07/03/2021  Component Date Value Ref Range Status   Hemoglobin A1C 07/03/2021 7.7 (A)  4.0 -  5.6 % Final  Infusion on 07/01/2021  Component Date Value Ref Range Status   Protein, ur 07/01/2021 <30 (A)  NEGATIVE mg/dL Final   Performed at [facility], [place] [address] [place], [place] [address]   Blood Bank Specimen 07/01/2021 SAMPLE AVAILABLE FOR TESTING   Final   Sample Expiration 07/01/2021    Final                   Value:07/04/2021,2359 Performed at [facility], [place] [address], [place], [place] [address]    Ferritin 07/01/2021 13  11 - 307 ng/mL Final   Performed at [facility], [address], [place], [place] [address]   WBC Count 07/01/2021 6.5  4.0 - 10.5 K/uL Final   RBC 07/01/2021 3.57 (L)  3.87 - 5.11 MIL/uL Final   Hemoglobin 07/01/2021 9.4 (L)  12.0 - 15.0 g/dL Final   HCT 07/01/2021 30.2 (L)  36.0 - 46.0 % Final   MCV 07/01/2021 84.6  80.0 -  100.0 fL Final   MCH 07/01/2021 26.3  26.0 - 34.0 pg Final   MCHC 07/01/2021 31.1  30.0 - 36.0 g/dL Final   RDW 07/01/2021 16.1 (H)  11.5 - 15.5 % Final   Platelet Count 07/01/2021 217  150 - 400 K/uL Final   nRBC 07/01/2021 0.0  0.0 - 0.2 % Final   Neutrophils Relative % 07/01/2021 81  % Final   Neutro Abs 07/01/2021 5.3  1.7 - 7.7 K/uL Final   Lymphocytes Relative 07/01/2021 6  % Final   Lymphs Abs 07/01/2021 0.4 (L)  0.7 - 4.0 K/uL Final   Monocytes Relative 07/01/2021 6  % Final   Monocytes Absolute 07/01/2021 0.4  0.1 - 1.0 K/uL Final   Eosinophils Relative 07/01/2021 6  % Final   Eosinophils Absolute 07/01/2021 0.4  0.0 - 0.5 K/uL Final   Basophils Relative 07/01/2021 1  % Final   Basophils Absolute 07/01/2021 0.0  0.0 - 0.1 K/uL Final   Immature Granulocytes 07/01/2021 0  % Final   Abs Immature Granulocytes 07/01/2021 0.02  0.00 - 0.07 K/uL Final   Performed at [facility], [place] [address] [place], [place] [address]   Sodium 07/01/2021 144  135 - 145 mmol/L Final   Potassium 07/01/2021 4.1  3.5 - 5.1 mmol/L Final   Chloride 07/01/2021 110  98 - 111 mmol/L Final   CO2 07/01/2021 29  22 - 32 mmol/L Final   Glucose, Bld 07/01/2021 172 (H)  70 - 99 mg/dL Final   Glucose reference range applies only to samples taken after fasting for at least 8 hours.   BUN 07/01/2021 13  8 - 23 mg/dL Final   Creatinine 07/01/2021 0.93  0.44 - 1.00 mg/dL Final   Calcium 07/01/2021 9.3  8.9 - 10.3 mg/dL Final   Total Protein 07/01/2021 6.2 (L)  6.5 - 8.1 g/dL Final   Albumin 07/01/2021 2.9 (L)  3.5 - 5.0 g/dL Final   AST 07/01/2021 23  15 - 41 U/L Final   ALT 07/01/2021 10  0 - 44 U/L Final   Alkaline Phosphatase 07/01/2021 105  38 - 126 U/L Final   Total Bilirubin 07/01/2021 0.6  0.3 - 1.2 mg/dL Final   GFR, Estimated 07/01/2021 >60  >60 mL/min Final   Comment: (NOTE) Calculated using the CKD-EPI Creatinine Equation (2021)    Anion gap 07/01/2021 5  5 - 15 Final    Performed at [facility], [place] [address] [place], [place] [address]    Allergies as of 07/03/2021       Reactions   Aspirin Nausea And Vomiting        Medication List        Accurate as of Jul 03, 2021 11:59 PM. If you have any questions, ask your nurse or doctor.          Accu-Chek Guide test strip Generic drug: glucose blood USE AS INSTRUCTED TO CHECK BLOOD SUGAR 3 TIMES DAILY.   Accu-Chek Softclix Lancets lancets 1 each by Other route 3 (three) times daily. Use as instructed to check blood sugar 3 times per day dx code E11.65   acetaminophen 325 MG tablet Commonly known as: TYLENOL Take 650 mg by mouth every 4 (four) hours as needed for moderate pain.   amLODipine 2.5 MG tablet Commonly known as: NORVASC Take 1 tablet (2.5 mg total) by mouth daily.   diclofenac Sodium 1 % Gel Commonly known as: VOLTAREN Apply 2 g topically 4 (four) times daily.   FreeStyle Libre 2 Sensor Misc 2 Devices by Does not apply route every 14 (fourteen) days.   gabapentin 100 MG capsule Commonly known as: NEURONTIN Take 1 capsule (100 mg total) by mouth 2 (two) times daily.   IMMUNE FORMULA PO Take 1 tablet by mouth daily.   insulin lispro 100 UNIT/ML KwikPen Commonly known as: HumaLOG KwikPen Inject 2 Units into the skin 3 (three) times daily. What changed: how much to take   Insulin Lispro Prot & Lispro (75-25) 100 UNIT/ML Kwikpen Commonly known as: HumaLOG Mix 75/25 KwikPen Inject 12 Units into the skin every morning.   lactulose 10 g packet Commonly known as: CEPHULAC Take 1 packet (10 g total) by mouth daily.   Lantus SoloStar 100 UNIT/ML Solostar Pen Generic drug: insulin glargine Inject 10 Units into the skin at bedtime.   levETIRAcetam 500 MG tablet Commonly known as: KEPPRA Take 1 tablet (500 mg total) by mouth 2 (two) times daily.   memantine 10 MG tablet Commonly known as: NAMENDA TAKE 1 TABLET BY MOUTH TWICE DAILY    mirtazapine 7.5 MG tablet Commonly known as: REMERON Take 7.5 mg by mouth at bedtime.   Multi For Her 50+ Tabs Take 1 tablet by mouth daily with breakfast.   NON FORMULARY See admin instructions. Compression Stockings apply in the morning and remove at bedtime   pantoprazole 40 MG tablet Commonly known as: PROTONIX Take 1 tablet (40 mg total) by mouth 2 (two) times daily.   polyethylene glycol powder 17 GM/SCOOP powder Commonly known as: GLYCOLAX/MIRALAX Take 17 g by mouth daily as needed for mild constipation.   torsemide 20 MG tablet Commonly known as: DEMADEX Take 1 tablet (20 mg total) by mouth as needed (please take 1 '20mg'$  tablet as needed for weight gain of 2 pounds overnight or 5 pounds in a week).   traMADol 50 MG tablet Commonly known as: ULTRAM Take 50 mg by mouth every 6 (six) hours as needed.   Trulicity 1.5 UE/4.5WU Sopn Generic drug: Dulaglutide Inject 1.5 mg into the skin every Thursday.   Trulicity 3 JW/1.1BJ Sopn Generic drug: Dulaglutide Inject into the skin.        Allergies:  Allergies  Allergen Reactions   Aspirin Nausea And Vomiting    Past Medical History:  Diagnosis Date   Chronic anemia    Chronic diastolic CHF (congestive heart failure) ([name] 10/03/2013   Chronic GI bleeding    /  notes 11/29/2014   Family history of anesthesia complication    "niece has a hard time coming out" (09/15/2012)   Frequent nosebleeds    chronic   Gastric AV malformation    /notes 11/29/2014   GERD (gastroesophageal reflux disease)    Heart murmur 04/01/2017   Moderate AVSC on echo 09/2016   History of blood transfusion "several"   History of epistaxis    HTN (hypertension), benign 03/02/2012   Hyperlipidemia    Iron deficiency anemia    chronic infusions"   Lichen planus    Both lower extremities   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    Past Surgical History:  Procedure Laterality Date   CATARACT EXTRACTION     "I think it was just one eye"   ESOPHAGOGASTRODUODENOSCOPY  02/26/2011   Procedure: ESOPHAGOGASTRODUODENOSCOPY (EGD);  Surgeon: [name];  [facility];  Service: Endoscopy;  Laterality: N/A;   ESOPHAGOGASTRODUODENOSCOPY N/A 11/08/2012   Procedure: ESOPHAGOGASTRODUODENOSCOPY (EGD);  Surgeon: [name];  [facility];  Service: Endoscopy;  Laterality: N/A;   ESOPHAGOGASTRODUODENOSCOPY N/A 10/04/2013   Procedure: ESOPHAGOGASTRODUODENOSCOPY (EGD);  Surgeon: [name];  [facility];  Service: Endoscopy;  Laterality: N/A;  with APC on stand-by   ESOPHAGOGASTRODUODENOSCOPY N/A 07/06/2014   Procedure: ESOPHAGOGASTRODUODENOSCOPY (EGD);  Surgeon: [name];  [facility];  Service: Endoscopy;  Laterality: N/A;   ESOPHAGOGASTRODUODENOSCOPY N/A 09/05/2014   Procedure: ESOPHAGOGASTRODUODENOSCOPY (EGD);  Surgeon: JaLaurence SpatesMD;  [facility];  Service: Endoscopy;  Laterality: N/A;  APC on standby to control bleeding   ESOPHAGOGASTRODUODENOSCOPY N/A 11/29/2014   Procedure: ESOPHAGOGASTRODUODENOSCOPY (EGD);  Surgeon: [name];  [facility];  Service: Endoscopy;  Laterality: N/A;   ESOPHAGOGASTRODUODENOSCOPY N/A 09/28/2015   Procedure: ESOPHAGOGASTRODUODENOSCOPY (EGD);  Surgeon: [name];  [facility];  Service: Endoscopy;  Laterality: N/A;   ESOPHAGOGASTRODUODENOSCOPY (EGD) WITH PROPOFOL N/A 12/04/2017   Procedure: ESOPHAGOGASTRODUODENOSCOPY (EGD) WITH PROPOFOL;  Surgeon: [name];  [facility]: Endoscopy;  Laterality: N/A;   ESOPHAGOGASTRODUODENOSCOPY (EGD) WITH PROPOFOL N/A  12/24/2020   Procedure: ESOPHAGOGASTRODUODENOSCOPY (EGD) WITH PROPOFOL;  Surgeon: OuArta SilenceMD;  [facility] Service: Endoscopy;  Laterality: N/A;   ESOPHAGOGASTRODUODENOSCOPY (EGD) WITH PROPOFOL N/A 01/19/2021   Procedure: ESOPHAGOGASTRODUODENOSCOPY (EGD) WITH PROPOFOL;  Surgeon: [name];  [place]: Endoscopy;  Laterality: N/A;   ESOPHAGOGASTRODUODENOSCOPY (EGD) WITH PROPOFOL N/A 02/06/2021   Procedure: ESOPHAGOGASTRODUODENOSCOPY (EGD) WITH PROPOFOL;  Surgeon: KaRonnette JuniperMD;  [facility] Service: Gastroenterology;  Laterality: N/A;   ESOPHAGOGASTRODUODENOSCOPY ENDOSCOPY  08/19/2006   with laser treatment   HEMOSTASIS CLIP PLACEMENT  12/24/2020   Procedure: HEMOSTASIS CLIP PLACEMENT;  Surgeon: OuArta SilenceMD;  [facility];  Service: Endoscopy;;   HEMOSTASIS CLIP PLACEMENT  01/19/2021   Procedure: HEMOSTASIS CLIP PLACEMENT;  Surgeon: [name];  [place] [facility] Service: Endoscopy;;   HOT HEMOSTASIS N/A 07/06/2014   Procedure: HOT HEMOSTASIS (ARGON PLASMA COAGULATION/BICAP);  Surgeon: [name];  [facility];  Service: Endoscopy;  Laterality: N/A;   HOT  HEMOSTASIS N/A 09/28/2015   Procedure: HOT HEMOSTASIS (ARGON PLASMA COAGULATION/BICAP);  Surgeon: Clarene Essex, MD;  Location[facility]COPY;  Service: Endoscopy;  Laterality: N/A;   HOT HEMOSTASIS N/A 12/04/2017   Procedure: HOT HEMOSTASIS (ARGON PLASMA COAGULATION/BICAP);  Surgeon: [name], MD;  Location: [place];  Service: Endoscopy;  Laterality: N/A;   IR ANGIOGRAM SELECTIVE EACH ADDITIONAL VESSEL  02/05/2021   IR ANGIOGRAM VISCERAL SELECTIVE  02/05/2021   IR EMBO ART  VEN HEMORR LYMPH EXTRAV  INC GUIDE ROADMAPPING  02/05/2021   IR US GUIDE VASC ACCESS RIGHT  02/05/2021   NASAL HEMORRHAGE CONTROL     "for bleeding"    SAVORY DILATION  02/26/2011   Procedure: SAVORY DILATION;  Surgeon: [name], MD;   [name];  Service: Endoscopy;  Laterality: N/A;  c-arm needed   SCLEROTHERAPY  12/24/2020   Procedure: SCLEROTHERAPY;  Surgeon: Arta Silence, MD;  [facility];  Service: Endoscopy;;   SCLEROTHERAPY  01/19/2021   Procedure: Clide Deutscher;  Surgeon: [name], MD;  [facility];  Service: Endoscopy;;   SUBMUCOSAL INJECTION  12/04/2017   Procedure: SUBMUCOSAL INJECTION;  Surgeon: [name], MD;  Location: [facility] ENDOSCOPY;  Service: Endoscopy;;    Family History  Problem Relation Age of Onset   Healthy Mother    Stroke Father    Diabetes Niece    Breast cancer Other    Malignant hyperthermia Neg Hx    Seizures Neg Hx     Social History:  reports that she quit smoking about 50 years ago. Her smoking use included cigarettes. She has a 20.00 pack-year smoking history. She has never used smokeless tobacco. She reports that she does not drink alcohol and does not use drugs.   Review of Systems   Lipid history: Not on a statin drug    Lab Results  Component Value Date   CHOL 133 01/30/2019   HDL 41.30 01/30/2019   LDLCALC 57 01/30/2019   LDLDIRECT 148 (H) 03/05/2021   TRIG 176.0 (H) 01/30/2019   CHOLHDL 3 01/30/2019           Hypertension: Treated with amlodipine and olmesartan  BP Readings from Last 3 Encounters:  07/03/21 122/70  07/01/21 124/67  06/24/21 122/70   Renal function history: Has CKD of unclear etiology  She is on Demadex prescribed by PCP   Lab Results  Component Value Date   CREATININE 0.93 07/01/2021   CREATININE 0.83 06/24/2021   CREATININE 1.15 (H) 05/05/2021   Lab Results  Component Value Date   K 4.1 40/98/1191     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, microalbuminuria     Physical Examination:  BP 122/70   Pulse 76   Ht '5\' 3"'$  (1.6 m)   Wt 120 lb (54.4 kg)   LMP  (LMP Unknown)   SpO2 99%   BMI 21.26 kg/m    ASSESSMENT:  Diabetes type 2 on insulin  See history of  present illness for detailed discussion of current diabetes management, blood sugar patterns and problems identified  A1c is 7.7  She is currently on Trulicity 1.5 mg, Humalog 3 units before meals, Lantus 10 units in the morning and Humalog mix 12 units in the morning  Blood sugars are highly variable and mostly high However she has normal readings periodically at all different times Also with adding Lantus to nursing home likely her fasting readings are better but she has readings as low as 67  Her diet is variable and sometimes  not eating much especially in the morning Also some high readings are related to milkshakes   PLAN:    Instructions were written for the nursing home as following  May have [place] shake if patient not eating a  meal  Please instruct Mr. Chandran to avoid bringing milkshakes from outside  REDUCE Lantus insulin to 7 units instead of 10  INCREASE Humalog at lunchtime to 4 units and continue 3 units before breakfast and dinner   Patient Instructions  May have [name] shake if patient not eating a  meal  Please instruct Mr. Pangborn to avoid bringing milkshakes from outside  REDUCE Lantus insulin to 7 units instead of 10  INCREASE Humalog at lunchtime to 4 units and continue 3 units before breakfast and dinner   [name] 07/06/2021, 1:43 PM   Note: This office note was prepared with Dragon voice recognition system technology. Any transcriptional errors that result from this process are unintentional.

## 2021-07-04 ENCOUNTER — Ambulatory Visit (HOSPITAL_COMMUNITY)
Admission: RE | Admit: 2021-07-04 | Discharge: 2021-07-04 | Disposition: A | Discharge status: Home / Self Care | Payer: Medicare Other | Source: Ambulatory Visit | Attending: Cardiology | Admitting: Cardiology

## 2021-07-04 DIAGNOSIS — I3139 Other pericardial effusion (noninflammatory): Secondary | ICD-10-CM

## 2021-07-04 DIAGNOSIS — I5032 Chronic diastolic (congestive) heart failure: Secondary | ICD-10-CM

## 2021-07-04 DIAGNOSIS — I272 Pulmonary hypertension, unspecified: Secondary | ICD-10-CM | POA: Diagnosis present

## 2021-07-04 DIAGNOSIS — I350 Nonrheumatic aortic (valve) stenosis: Secondary | ICD-10-CM

## 2021-07-04 DIAGNOSIS — I10 Essential (primary) hypertension: Secondary | ICD-10-CM | POA: Diagnosis present

## 2021-07-04 MED ORDER — TECHNETIUM TO 99M ALBUMIN AGGREGATED
4.2000 | Freq: Once | INTRAVENOUS | Status: AC | PRN
  Administered 2021-07-04: 4.2 via INTRAVENOUS

## 2021-07-14 ENCOUNTER — Ambulatory Visit: Payer: Medicare Other

## 2021-07-14 ENCOUNTER — Other Ambulatory Visit: Payer: Medicare Other

## 2021-07-14 ENCOUNTER — Emergency Department (HOSPITAL_COMMUNITY)
Admission: EM | Admit: 2021-07-14 | Discharge: 2021-07-14 | Disposition: A | Discharge status: Home / Self Care | Payer: Medicare Other | Attending: Emergency Medicine | Admitting: Emergency Medicine

## 2021-07-14 ENCOUNTER — Emergency Department (HOSPITAL_COMMUNITY): Payer: Medicare Other

## 2021-07-14 ENCOUNTER — Encounter (HOSPITAL_COMMUNITY): Payer: Self-pay | Admitting: Emergency Medicine

## 2021-07-14 ENCOUNTER — Ambulatory Visit: Payer: Medicare Other | Admitting: Nurse Practitioner

## 2021-07-14 DIAGNOSIS — N189 Chronic kidney disease, unspecified: Secondary | ICD-10-CM | POA: Diagnosis not present

## 2021-07-14 DIAGNOSIS — W19XXXA Unspecified fall, initial encounter: Secondary | ICD-10-CM | POA: Diagnosis not present

## 2021-07-14 DIAGNOSIS — I509 Heart failure, unspecified: Secondary | ICD-10-CM | POA: Insufficient documentation

## 2021-07-14 DIAGNOSIS — I130 Hypertensive heart and chronic kidney disease with heart failure and stage 1 through stage 4 chronic kidney disease, or unspecified chronic kidney disease: Secondary | ICD-10-CM | POA: Insufficient documentation

## 2021-07-14 DIAGNOSIS — M25561 Pain in right knee: Secondary | ICD-10-CM | POA: Insufficient documentation

## 2021-07-14 DIAGNOSIS — M25562 Pain in left knee: Secondary | ICD-10-CM | POA: Diagnosis not present

## 2021-07-14 MED ORDER — ACETAMINOPHEN 325 MG PO TABS: 650.0000 mg | ORAL_TABLET | Freq: Once | ORAL | Status: DC

## 2021-07-14 NOTE — Progress Notes (Signed)
Orthopedic Tech Progress Note Patient Details:  [name] 07/27/1940 [id number]  Ortho Devices Type of Ortho Device: Knee Sleeve Ortho Device/Splint Location: bi-lateral Ortho Device/Splint Interventions: Ordered, Application, Adjustment   Post Interventions Patient Tolerated: Well Instructions Provided: Care of device, Adjustment of device  Karolee Stamps 07/14/2021, 10:54 PM

## 2021-07-14 NOTE — ED Provider Notes (Signed)
[facility] EMERGENCY DEPARTMENT Provider Note   CSN: [id number] Arrival date & time: 07/14/21  1839     History  Chief Complaint  Patient presents with   Knee Pain    Ziyon Cedotal Cammarano is a 81 y.o. female.  The history is provided by the patient and medical records. No language interpreter was used.  Knee Pain Location:  Knee Time since incident:  12 hours Injury: yes   Mechanism of injury: fall   Knee location:  R knee and L knee Pain details:    Quality:  Aching   Radiates to:  Does not radiate   Severity:  Moderate   Onset quality:  Sudden   Timing:  Constant   Progression:  Unchanged Chronicity:  New Dislocation: no   Prior injury to area:  No Relieved by:  Nothing Worsened by:  Bearing weight Ineffective treatments:  None tried Associated symptoms: no back pain, no fatigue, no fever, no muscle weakness, no neck pain, no numbness, no swelling and no tingling       Home Medications Prior to Admission medications   Medication Sig Start Date End Date Taking? Authorizing Provider  ACCU-CHEK GUIDE test strip USE AS INSTRUCTED TO CHECK BLOOD SUGAR 3 TIMES DAILY. Patient not taking: Reported on 07/03/2021 08/08/20   Elayne Snare, MD  Accu-Chek Softclix Lancets lancets 1 each by Other route 3 (three) times daily. Use as instructed to check blood sugar 3 times per day dx code E11.65 Patient not taking: Reported on 07/03/2021 02/01/20   Elayne Snare, MD  acetaminophen (TYLENOL) 325 MG tablet Take 650 mg by mouth every 4 (four) hours as needed for moderate pain. Patient not taking: Reported on 07/03/2021    [provider]  amLODipine (NORVASC) 2.5 MG tablet Take 1 tablet (2.5 mg total) by mouth daily. 03/03/21 03/03/22  Orvis Brill, MD  Continuous Blood Gluc Sensor (FREESTYLE LIBRE 2 SENSOR) MISC 2 Devices by Does not apply route every 14 (fourteen) days. Patient not taking: Reported on 07/03/2021 04/25/20   Elayne Snare, MD  diclofenac Sodium  (VOLTAREN) 1 % GEL Apply 2 g topically 4 (four) times daily. 12/26/20   Lacinda Axon, MD  Dulaglutide (TRULICITY) 1.5 DD/2.2GU SOPN Inject 1.5 mg into the skin every Thursday. Patient not taking: Reported on 07/03/2021 02/13/21   Orvis Brill, MD  gabapentin (NEURONTIN) 100 MG capsule Take 1 capsule (100 mg total) by mouth 2 (two) times daily. 02/12/21   Orvis Brill, MD  insulin lispro (HUMALOG KWIKPEN) 100 UNIT/ML KwikPen Inject 2 Units into the skin 3 (three) times daily. Patient taking differently: Inject 3 Units into the skin 3 (three) times daily. 02/12/21   Orvis Brill, MD  Insulin Lispro Prot & Lispro (HUMALOG MIX 75/25 KWIKPEN) (75-25) 100 UNIT/ML Kwikpen Inject 12 Units into the skin every morning. 02/12/21   Orvis Brill, MD  lactulose (CEPHULAC) 10 g packet Take 1 packet (10 g total) by mouth daily. 03/03/21   Orvis Brill, MD  LANTUS SOLOSTAR 100 UNIT/ML Solostar Pen Inject 10 Units into the skin at bedtime. 05/26/21   [provider]  levETIRAcetam (KEPPRA) 500 MG tablet Take 1 tablet (500 mg total) by mouth 2 (two) times daily. 02/12/21   Orvis Brill, MD  memantine (NAMENDA) 10 MG tablet TAKE 1 TABLET BY MOUTH TWICE DAILY 11/11/20   [name], Areeg N, DO  mirtazapine (REMERON) 7.5 MG tablet Take 7.5 mg by mouth at bedtime. 06/02/21  [provider]  Misc Natural Products (IMMUNE FORMULA PO) Take 1 tablet by mouth daily.    [provider]  Multiple Vitamins-Minerals (MULTI FOR HER 50+) TABS Take 1 tablet by mouth daily with breakfast.    [provider]  NON FORMULARY See admin instructions. Compression Stockings apply in the morning and remove at bedtime    [provider]  pantoprazole (PROTONIX) 40 MG tablet Take 1 tablet (40 mg total) by mouth 2 (two) times daily. 12/26/20   Lacinda Axon, MD  polyethylene glycol powder (GLYCOLAX/MIRALAX) 17 GM/SCOOP powder Take 17 g by mouth daily as needed for  mild constipation. Patient not taking: Reported on 07/03/2021    [provider]  torsemide (DEMADEX) 20 MG tablet Take 1 tablet (20 mg total) by mouth as needed (please take 1 '20mg'$  tablet as needed for weight gain of 2 pounds overnight or 5 pounds in a week). 03/05/21 06/19/21  Lenna Sciara, NP  traMADol (ULTRAM) 50 MG tablet Take 50 mg by mouth every 6 (six) hours as needed. 03/06/21   [provider]  TRULICITY 3 GU/5.4YH SOPN Inject into the skin. 06/04/21   [provider]      Allergies    Aspirin    Review of Systems   Review of Systems  Constitutional:  Negative for chills, fatigue and fever.  HENT:  Negative for congestion.   Respiratory:  Negative for cough, chest tightness, shortness of breath and wheezing.   Cardiovascular:  Negative for chest pain.  Gastrointestinal:  Negative for abdominal pain.  Genitourinary:  Negative for dysuria.  Musculoskeletal:  Negative for back pain and neck pain.  Skin:  Negative for rash and wound.  Neurological:  Negative for weakness, numbness and headaches.  Psychiatric/Behavioral:  Negative for agitation.   All other systems reviewed and are negative.  Physical Exam Updated Vital Signs BP (!) 147/75 (BP Location: Right Arm)   Pulse 82   Temp 98.5 F (36.9 C) (Oral)   Resp 16   LMP  (LMP Unknown)   SpO2 100%  Physical Exam Vitals and nursing note reviewed.  Constitutional:      General: She is not in acute distress.    Appearance: She is well-developed. She is not ill-appearing, toxic-appearing or diaphoretic.  HENT:     Head: Normocephalic and atraumatic.  Eyes:     Conjunctiva/sclera: Conjunctivae normal.  Cardiovascular:     Rate and Rhythm: Normal rate and regular rhythm.     Heart sounds: No murmur heard. Pulmonary:     Effort: Pulmonary effort is normal. No respiratory distress.     Breath sounds: Normal breath sounds. No wheezing, rhonchi or rales.  Chest:     Chest wall: No tenderness.   Abdominal:     Palpations: Abdomen is soft.     Tenderness: There is no abdominal tenderness. There is no right CVA tenderness, left CVA tenderness, guarding or rebound.  Musculoskeletal:        General: Tenderness present. No swelling.     Cervical back: Neck supple.     Right knee: No crepitus. Tenderness present. Normal alignment. Normal pulse.     Left knee: No crepitus. Tenderness present. Normal alignment. Normal pulse.     Right lower leg: No edema.     Left lower leg: No edema.     Comments: Intact sensation, strength, and pulses distally.  Tenderness on the knees bilaterally.  Some swelling.  Some mild abrasion.  No laceration or  large wounds.  No tenderness in the back of the leg and no tenderness proximally or distally.  No hip tenderness.  Exam otherwise unremarkable.  Skin:    General: Skin is warm and dry.     Capillary Refill: Capillary refill takes less than 2 seconds.     Findings: No erythema or rash.  Neurological:     General: No focal deficit present.     Mental Status: She is alert.     Sensory: No sensory deficit.     Motor: No weakness.  Psychiatric:        Mood and Affect: Mood normal.    ED Results / Procedures / Treatments   Labs (all labs ordered are listed, but only abnormal results are displayed) Labs Reviewed - No data to display  EKG None  Radiology DG Knee Complete 4 Views Left  Result Date: 07/14/2021 CLINICAL DATA:  Fall, left knee pain EXAM: LEFT KNEE - COMPLETE 4+ VIEW COMPARISON:  11/30/2014 FINDINGS: Diffuse osseous demineralization. No evidence of acute fracture. No malalignment. Moderate tricompartmental osteoarthritis, most pronounced within the lateral compartment. No joint effusion. Soft tissues within normal limits. IMPRESSION: 1. No acute fracture or malalignment of the left knee. 2. Moderate tricompartmental osteoarthritis. Electronically Signed   By: Davina Poke D.O.   On: 07/14/2021 20:07   DG Knee Complete 4 Views  Right  Result Date: 07/14/2021 CLINICAL DATA:  Fall, right knee pain EXAM: RIGHT KNEE - COMPLETE 4+ VIEW COMPARISON:  None Available. FINDINGS: Diffuse osseous demineralization. No evidence of acute fracture. No malalignment. Mild tricompartmental joint space narrowing with chondrocalcinosis. Small knee joint effusion. Generalized soft tissue swelling. IMPRESSION: 1. No acute fracture or dislocation. 2. Mild tricompartmental osteoarthritis with chondrocalcinosis. 3. Small knee joint effusion. Electronically Signed   By: Davina Poke D.O.   On: 07/14/2021 20:06    Procedures Procedures    Medications Ordered in ED Medications  acetaminophen (TYLENOL) tablet 650 mg (has no administration in time range)    ED Course/ Medical Decision Making/ A&P                           Medical Decision Making   DESARAY MARSCHNER is a 81 y.o. female with a past medical history significant for CKD, hypertension, CHF, pulmonary hypertension, seizures, hyperlipidemia, and previous GI bleed who presents with bilateral knee pain after fall.  Patient reports that she is post to use assistance when she goes to the bathroom but tried to do so this morning without help.  She reports she got tripped up and fell hitting both knees on the ground.  She did not hit her head, neck, chest, or back.  She is only complaining of bilateral knee pain.  It is moderate to severe but she reports she wanted to make sure nothing was broken.  She denies numbness or tingling, weakness distally.  Denies lacerations.  Denies bleeding.  Denies any pain in her hips, back, or rest of body.  She reports no preceding symptoms and was at her baseline before she tripped and fell.  Patient was seen in triage and had some x-rays ordered.  On exam, lungs clear and chest nontender.  Abdomen nontender.  Back nontender.  Hips nontender.  Ankles and feet nontender.  Patient had intact sensation, strength, and pulses distally.  She had some tenderness of  both knees with mild abrasion.  No laceration seen.  Tenderness is primarily anterior and not posteriorly.  No other tenderness in the mid thigh.  No muscle spasm seen.  She had full range of motion without significant pain when I bent her knees but more painful when she used them making me suspicious for musculoskeletal or soft tissue injury.  Patient had x-rays that showed bilateral osteoarthritis but no acute fracture or dislocation.  There was a small effusion but otherwise no evidence of acute fracture.  We discussed using knee sleeves to help support her knees and have her follow-up with orthopedics if it persist.  We discussed the possibility of head injury not seen initial x-ray.  We will give her some Tylenol.  Given lack of preceding symptoms or other complaints, will discharge home.  Patient agrees and will be discharged for outpatient follow-up.         Final Clinical Impression(s) / ED Diagnoses Final diagnoses:  Acute pain of both knees  Fall, initial encounter    Rx / DC Orders ED Discharge Orders     None      Clinical Impression: 1. Acute pain of both knees   2. Fall, initial encounter     Disposition: Discharge  Condition: Good  I have discussed the results, Dx and Tx plan with the pt(& family if present). He/she/they expressed understanding and agree(s) with the plan. Discharge instructions discussed at great length. Strict return precautions discussed and pt &/or family have verbalized understanding of the instructions. No further questions at time of discharge.    New Prescriptions   No medications on file    Follow Up: Rosilyn Mings [place] [address] [facility] [address] [id number] mc [place]       Audrey Eller, Gwenyth Allegra, MD 07/14/21 2233

## 2021-07-14 NOTE — ED Notes (Signed)
Dc VS BP 169/64 P-69 SPO2 100 TEMP 98.3

## 2021-07-14 NOTE — ED Triage Notes (Signed)
Patient BIB [facility] from [facility] for evaluation of bilateral knee pain after mechanical fall at 1600. Patient rates pain 2/10, no obvious deformity, pain is unchanged with palpation. Patient is alert and in no apparent distress at this time.

## 2021-07-14 NOTE — ED Notes (Signed)
Attempted to call [facility] 3 times no answer.

## 2021-07-14 NOTE — ED Provider Triage Note (Signed)
Emergency Medicine Provider Triage Evaluation Note  [name] , a 81 y.o. female  was evaluated in triage.  Pt complains of bilat knee pain after falling   Review of Systems  Positive: Pain with moving Negative: No impact of hea   Physical Exam  BP (!) 147/75 (BP Location: Right Arm)   Pulse 82   Temp 98.5 F (36.9 C) (Oral)   Resp 16   LMP  (LMP Unknown)   SpO2 100%  Gen:   Awake, no distress   Resp:  Normal effort  MSK:   Moves extremities.  Tender bilat knees to palpation  Other:   Medical Decision Making  Medically screening exam initiated at 7:30 PM.  Appropriate orders placed.  Pachia L Glab was informed that the remainder of the evaluation will be completed by another provider, this initial triage assessment does not replace that evaluation, and the importance of remaining in the ED until their evaluation is complete.     Fransico Meadow, [place] 07/14/21 1931

## 2021-07-14 NOTE — Discharge Instructions (Addendum)
Your history, exam, and imaging today did not show evidence of acute fractures in your legs after the fall.  I do suspect you have some musculoskeletal and soft tissue injury leading to the discomfort you are experiencing.  Please use the knee sleeve to help protect and stabilize your knee although the imaging today did not show any acute bony problem.  If symptoms persist, please follow-up with outpatient orthopedics to discuss further work-up and management.  Please be careful not to fall again.  Please use over-the-counter medications to help with your discomfort.  If any symptoms change or worsen acutely, please return to the nearest emergency department.

## 2021-07-22 ENCOUNTER — Ambulatory Visit (INDEPENDENT_AMBULATORY_CARE_PROVIDER_SITE_OTHER): Payer: Medicare Other | Admitting: Internal Medicine

## 2021-07-22 ENCOUNTER — Encounter: Payer: Self-pay | Admitting: Internal Medicine

## 2021-07-22 VITALS — BP 130/64 | HR 70

## 2021-07-22 DIAGNOSIS — R911 Solitary pulmonary nodule: Secondary | ICD-10-CM

## 2021-07-22 DIAGNOSIS — R9389 Abnormal findings on diagnostic imaging of other specified body structures: Secondary | ICD-10-CM

## 2021-07-22 NOTE — Patient Instructions (Addendum)
Please schedule follow up scheduled with myself in 2-3weeks.  If my schedule is not open yet, we will contact you with a reminder closer to that time. Please call [phone] if you haven't heard from [place] a month before.   Before your next visit I would like you to have:  CT Chest - to follow up on lung nodule. We will call you to schedule this.  I will see you back after this.

## 2021-07-22 NOTE — Progress Notes (Signed)
[name]    [id number]    Jan 08, 1941  Primary Care Physician:[name], Charlsie Quest, DO  Referring Physician: [name], MD [address] [place],  [place] [address] Reason for Consultation: abnormal chest xray Date of Consultation: 07/22/2021  Chief complaint:   Chief Complaint  Patient presents with   Consult    Referred by cardiology for abnormal PFT last month.      HPI: [name] is a 81 y.o. 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 Radford Pax from cardiology.   The patient is coming from [facility] where she lives. She has an aide with her. The patient is a limited historian.   She is currently in a nursing home and does not walk around much - mostly just sits in a wheelchair.  She denies coughing, chest tightness, wheezing.  Denies fevers, chills, night sweats.   She has been living at [facility] for the past 6 months and says she has lost weight due to not liking the food. Notes that her clothes are getting to big on her.    Social history:  Occupation: retired from Clinical [employer] Smoking history: former smoker, cannot tell me smoking history or when she quit   Social History   Occupational History   Occupation: Retired Systems developer: RETIRED  Tobacco Use   Smoking status: Former    Packs/day: 1.00    Years: 20.00    Total pack years: 20.00    Types: Cigarettes    Quit date: 02/10/1971    Years since quitting: 50.4   Smokeless tobacco: Never   Tobacco comments:    09/15/2012 "smoked 50-60 yr ago"  Vaping Use   Vaping Use: Never used  Substance and Sexual Activity   Alcohol use: No    Alcohol/week: 0.0 standard drinks of alcohol   Drug use: No   Sexual activity: Not on file    Relevant family history:  Family  History  Problem Relation Age of Onset   Healthy Mother    Stroke Father    Diabetes Niece    Breast cancer Other    Malignant hyperthermia Neg Hx    Seizures Neg Hx    Lung cancer Neg Hx     Past Medical History:  Diagnosis Date   Chronic anemia    Chronic diastolic CHF (congestive heart failure) ([place]) 10/03/2013   Chronic GI bleeding    /notes 11/29/2014   Family history of anesthesia complication    "niece has a hard time coming out" (09/15/2012)   Frequent nosebleeds    chronic   Gastric AV malformation    /notes 11/29/2014   GERD (gastroesophageal reflux disease)    Heart murmur 04/01/2017   Moderate AVSC on echo 09/2016   History of blood transfusion "several"   History of epistaxis    HTN (hypertension), benign 03/02/2012   Hyperlipidemia    Iron deficiency anemia    chronic infusions"   Lichen planus    Both lower extremities   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    Past Surgical History:  Procedure Laterality Date   CATARACT EXTRACTION     "I think it was just one eye"   ESOPHAGOGASTRODUODENOSCOPY  02/26/2011   Procedure: ESOPHAGOGASTRODUODENOSCOPY (EGD);  Surgeon: [name], MD;  Location: [name];  Service: Endoscopy;  Laterality: N/A;   ESOPHAGOGASTRODUODENOSCOPY N/A 11/08/2012   Procedure: ESOPHAGOGASTRODUODENOSCOPY (EGD);  Surgeon: [name], MD;  Location: [name];  Service: Endoscopy;  Laterality: N/A;   ESOPHAGOGASTRODUODENOSCOPY N/A 10/04/2013   Procedure: ESOPHAGOGASTRODUODENOSCOPY (EGD);  Surgeon: Winfield Cunas., MD;  Location: [name];  Service: Endoscopy;  Laterality: N/A;  with APC on stand-by   ESOPHAGOGASTRODUODENOSCOPY N/A  07/06/2014   Procedure: ESOPHAGOGASTRODUODENOSCOPY (EGD);  Surgeon: Clarene Essex, MD;  Location: [name];  Service: Endoscopy;  Laterality: N/A;   ESOPHAGOGASTRODUODENOSCOPY N/A 09/05/2014   Procedure: ESOPHAGOGASTRODUODENOSCOPY (EGD);  Surgeon: Laurence Spates, MD;  Location: [name];  Service: Endoscopy;  Laterality: N/A;  APC on standby to control bleeding   ESOPHAGOGASTRODUODENOSCOPY N/A 11/29/2014   Procedure: ESOPHAGOGASTRODUODENOSCOPY (EGD);  Surgeon: [name], MD;  [facility] ENDOSCOPY;  Service: Endoscopy;  Laterality: N/A;   ESOPHAGOGASTRODUODENOSCOPY N/A 09/28/2015   Procedure: ESOPHAGOGASTRODUODENOSCOPY (EGD);  Surgeon: Clarene Essex, MD;  Location: [facility] ENDOSCOPY;  Service: Endoscopy;  Laterality: N/A;   ESOPHAGOGASTRODUODENOSCOPY (EGD) WITH PROPOFOL N/A 12/04/2017   Procedure: ESOPHAGOGASTRODUODENOSCOPY (EGD) WITH PROPOFOL;  Surgeon: [name], MD;  Location: [place];  Service: Endoscopy;  Laterality: N/A;   ESOPHAGOGASTRODUODENOSCOPY (EGD) WITH PROPOFOL N/A 12/24/2020   Procedure: ESOPHAGOGASTRODUODENOSCOPY (EGD) WITH PROPOFOL;  Surgeon: Arta Silence, MD;  Location: [place];  Service: Endoscopy;  Laterality: N/A;   ESOPHAGOGASTRODUODENOSCOPY (EGD) WITH PROPOFOL N/A 01/19/2021   Procedure: ESOPHAGOGASTRODUODENOSCOPY (EGD) WITH PROPOFOL;  Surgeon: [name], MD;  Location: [place];  Service: Endoscopy;  Laterality: N/A;   ESOPHAGOGASTRODUODENOSCOPY (EGD) WITH PROPOFOL N/A 02/06/2021   Procedure: ESOPHAGOGASTRODUODENOSCOPY (EGD) WITH PROPOFOL;  Surgeon: Ronnette Juniper, MD;  Location: [place];  Service: Gastroenterology;  Laterality: N/A;   ESOPHAGOGASTRODUODENOSCOPY ENDOSCOPY  08/19/2006   with laser treatment   HEMOSTASIS CLIP PLACEMENT  12/24/2020   Procedure: HEMOSTASIS CLIP PLACEMENT;  Surgeon: Arta Silence, MD;  Location: [facility] ENDOSCOPY;  Service: Endoscopy;;   HEMOSTASIS CLIP PLACEMENT  01/19/2021   Procedure: HEMOSTASIS CLIP  PLACEMENT;  Surgeon: [name], MD;  Location: [place];  Service: Endoscopy;;   HOT HEMOSTASIS N/A 07/06/2014   Procedure: HOT HEMOSTASIS (ARGON PLASMA COAGULATION/BICAP);  Surgeon: Clarene Essex, MD;  Location: [name];  Service: Endoscopy;  Laterality: N/A;   HOT HEMOSTASIS N/A 09/28/2015   Procedure: HOT HEMOSTASIS (ARGON PLASMA COAGULATION/BICAP);  Surgeon: Clarene Essex, MD;  Location: [facility] ENDOSCOPY;  Service: Endoscopy;  Laterality: N/A;   HOT HEMOSTASIS N/A 12/04/2017   Procedure: HOT HEMOSTASIS (ARGON PLASMA COAGULATION/BICAP);  Surgeon: [name], MD;  Location: [place];  Service: Endoscopy;  Laterality: N/A;   IR ANGIOGRAM SELECTIVE EACH ADDITIONAL VESSEL  02/05/2021   IR ANGIOGRAM VISCERAL SELECTIVE  02/05/2021   IR EMBO ART  VEN HEMORR LYMPH EXTRAV  INC GUIDE ROADMAPPING  02/05/2021   IR US GUIDE VASC ACCESS RIGHT  02/05/2021   NASAL HEMORRHAGE CONTROL     "for bleeding"    SAVORY DILATION  02/26/2011   Procedure: SAVORY DILATION;  Surgeon: [name], MD;  Location: [name];  Service: Endoscopy;  Laterality: N/A;  c-arm needed  SCLEROTHERAPY  12/24/2020   Procedure: Clide Deutscher;  Surgeon: Arta Silence, MD;  Location: [facility] ENDOSCOPY;  Service: Endoscopy;;   SCLEROTHERAPY  01/19/2021   Procedure: Clide Deutscher;  Surgeon: [name], MD;  Location: [facility] ENDOSCOPY;  Service: Endoscopy;;   SUBMUCOSAL INJECTION  12/04/2017   Procedure: SUBMUCOSAL INJECTION;  Surgeon: [name], MD;  Location: [place];  Service: Endoscopy;;     Physical Exam: Blood pressure 130/64, pulse 70, SpO2 100 %. Gen:      No acute distress ENT:  no nasal polyps, mucus membranes moist Lungs:    No increased respiratory effort, symmetric chest wall excursion, clear to auscultation bilaterally, no wheezes or crackles CV:         Regular rate and rhythm; soft systolic murmur  No pedal edema Abd:      + bowel sounds; soft, non-tender; no distension MSK: no  acute synovitis of DIP or PIP joints, no mechanics hands.  Skin:      Warm and dry; no rashes Neuro: normal speech, no focal facial asymmetry Psych: alert and oriented to self. Flat affect.    Data Reviewed/Medical Decision Making:  Independent interpretation of tests: Imaging:  Review of patient's CT Chest from Dec 2022 and subsequent chest xrays images revealed persistent LUL mass. The patient's images have been independently reviewed by me.    V/Q scan May 2023 shows normal lung perfusion   PFTs: I have personally reviewed the patient's PFTs and spirometry shows no airflow limitation, does suggest restriction to ventilation but lung volumes need to be obtained to confirm.      Latest Ref Rng & Units 06/26/2021   10:27 AM  PFT Results  FVC-Pre L 1.01   FVC-Predicted Pre % 53   Pre FEV1/FVC % % 74   FEV1-Pre L 0.75   FEV1-Predicted Pre % 51     Labs:  Lab Results  Component Value Date   WBC 6.5 07/01/2021   HGB 9.4 (L) 07/01/2021   HCT 30.2 (L) 07/01/2021   MCV 84.6 07/01/2021   PLT 217 07/01/2021   Lab Results  Component Value Date   NA 144 07/01/2021   K 4.1 07/01/2021   CL 110 07/01/2021   CO2 29 07/01/2021     Immunization status:  Immunization History  Administered Date(s) Administered   [employer] Covid-19 Vaccine Bivalent Booster 66yr & up 01/22/2021   Pneumococcal Polysaccharide-23 02/10/2003   Td 02/10/2003     I reviewed prior external note(s) from cardiology.  I reviewed the result(s) of the labs and imaging as noted above.   I have ordered CT Chest  Discussion of management or test interpretation with another colleague.   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 She currently denies any dyspnea or respiratory symptoms. I think this is likely secondary to her poor functional status.   I  also called her niece DLangley Gauss husband jJenny Reichmannand goddaughter Tabatha listed in the chart to try and convey these concerns. Unfortunately no one picked up the phone - I was able to leave a message for call back to tabatha.   I spent 45 minutes in the care of this patient today including pre-charting, chart review, review of results, face-to-face care, coordination of care and communication with consultants etc.).   Return to Care: Return in about 2 weeks (around 08/05/2021).  NLenice Llamas MD Pulmonary and  [place]  CC: [name], MD

## 2021-07-23 NOTE — Telephone Encounter (Signed)
Home sleep Test ordered [facility] notified.

## 2021-07-23 NOTE — Telephone Encounter (Signed)
Lynnae Sandhoff called from the sleep lab stating the patient's family called in to say the patient lives in a nursing facility and has to lifted from bed to chair and the sleep lab cannot accommodate the patient for a in lab study neither can any family come stay with the patient. Lynnae Sandhoff ask if Dr Radford Pax will consider changing the in lab study to an Home Study study and the family will come pick up the device.

## 2021-07-23 NOTE — Telephone Encounter (Signed)
Per Dr Radford Pax: If patient or family have a smart phone then can get [place] study if not then needs HST

## 2021-07-23 NOTE — Addendum Note (Signed)
Addended by: Freada Bergeron on: 07/23/2021 04:25 PM   Modules accepted: Orders

## 2021-07-29 ENCOUNTER — Encounter (HOSPITAL_BASED_OUTPATIENT_CLINIC_OR_DEPARTMENT_OTHER): Payer: Medicare Other | Admitting: Cardiology

## 2021-07-30 ENCOUNTER — Inpatient Hospital Stay: Payer: Medicare Other

## 2021-07-30 ENCOUNTER — Inpatient Hospital Stay (HOSPITAL_BASED_OUTPATIENT_CLINIC_OR_DEPARTMENT_OTHER): Payer: Medicare Other | Admitting: Nurse Practitioner

## 2021-07-30 ENCOUNTER — Other Ambulatory Visit: Payer: Self-pay

## 2021-07-30 ENCOUNTER — Encounter: Payer: Self-pay | Admitting: Nurse Practitioner

## 2021-07-30 ENCOUNTER — Other Ambulatory Visit: Payer: Self-pay | Admitting: Nurse Practitioner

## 2021-07-30 ENCOUNTER — Inpatient Hospital Stay: Payer: Medicare Other | Attending: Hematology

## 2021-07-30 VITALS — BP 136/68 | HR 58 | Temp 97.8°F | Resp 16

## 2021-07-30 DIAGNOSIS — I780 Hereditary hemorrhagic telangiectasia: Secondary | ICD-10-CM | POA: Insufficient documentation

## 2021-07-30 DIAGNOSIS — D500 Iron deficiency anemia secondary to blood loss (chronic): Secondary | ICD-10-CM | POA: Diagnosis not present

## 2021-07-30 DIAGNOSIS — K31819 Angiodysplasia of stomach and duodenum without bleeding: Secondary | ICD-10-CM

## 2021-07-30 DIAGNOSIS — Z5111 Encounter for antineoplastic chemotherapy: Secondary | ICD-10-CM | POA: Insufficient documentation

## 2021-07-30 DIAGNOSIS — R809 Proteinuria, unspecified: Secondary | ICD-10-CM | POA: Insufficient documentation

## 2021-07-30 DIAGNOSIS — E1122 Type 2 diabetes mellitus with diabetic chronic kidney disease: Secondary | ICD-10-CM | POA: Diagnosis not present

## 2021-07-30 DIAGNOSIS — I130 Hypertensive heart and chronic kidney disease with heart failure and stage 1 through stage 4 chronic kidney disease, or unspecified chronic kidney disease: Secondary | ICD-10-CM | POA: Insufficient documentation

## 2021-07-30 DIAGNOSIS — N183 Chronic kidney disease, stage 3 unspecified: Secondary | ICD-10-CM | POA: Diagnosis not present

## 2021-07-30 DIAGNOSIS — Z95828 Presence of other vascular implants and grafts: Secondary | ICD-10-CM

## 2021-07-30 DIAGNOSIS — D508 Other iron deficiency anemias: Secondary | ICD-10-CM

## 2021-07-30 DIAGNOSIS — K31811 Angiodysplasia of stomach and duodenum with bleeding: Secondary | ICD-10-CM

## 2021-07-30 LAB — CBC WITH DIFFERENTIAL (CANCER CENTER ONLY)
Abs Immature Granulocytes: 0.02 10*3/uL (ref 0.00–0.07)
Basophils Absolute: 0 10*3/uL (ref 0.0–0.1)
Basophils Relative: 1 %
Eosinophils Absolute: 0.4 10*3/uL (ref 0.0–0.5)
Eosinophils Relative: 9 %
HCT: 39.3 % (ref 36.0–46.0)
Hemoglobin: 12.7 g/dL (ref 12.0–15.0)
Immature Granulocytes: 0 %
Lymphocytes Relative: 17 %
Lymphs Abs: 0.8 10*3/uL (ref 0.7–4.0)
MCH: 28 pg (ref 26.0–34.0)
MCHC: 32.3 g/dL (ref 30.0–36.0)
MCV: 86.8 fL (ref 80.0–100.0)
Monocytes Absolute: 0.5 10*3/uL (ref 0.1–1.0)
Monocytes Relative: 11 %
Neutro Abs: 3.1 10*3/uL (ref 1.7–7.7)
Neutrophils Relative %: 62 %
Platelet Count: 136 10*3/uL — ABNORMAL LOW (ref 150–400)
RBC: 4.53 MIL/uL (ref 3.87–5.11)
RDW: 16.7 % — ABNORMAL HIGH (ref 11.5–15.5)
WBC Count: 4.9 10*3/uL (ref 4.0–10.5)
nRBC: 0 % (ref 0.0–0.2)

## 2021-07-30 LAB — CMP (CANCER CENTER ONLY)
ALT: 13 U/L (ref 0–44)
AST: 36 U/L (ref 15–41)
Albumin: 3 g/dL — ABNORMAL LOW (ref 3.5–5.0)
Alkaline Phosphatase: 111 U/L (ref 38–126)
Anion gap: 4 — ABNORMAL LOW (ref 5–15)
BUN: 17 mg/dL (ref 8–23)
CO2: 30 mmol/L (ref 22–32)
Calcium: 9.5 mg/dL (ref 8.9–10.3)
Chloride: 106 mmol/L (ref 98–111)
Creatinine: 1.15 mg/dL — ABNORMAL HIGH (ref 0.44–1.00)
GFR, Estimated: 48 mL/min — ABNORMAL LOW
Glucose, Bld: 294 mg/dL — ABNORMAL HIGH (ref 70–99)
Potassium: 4.1 mmol/L (ref 3.5–5.1)
Sodium: 140 mmol/L (ref 135–145)
Total Bilirubin: 0.7 mg/dL (ref 0.3–1.2)
Total Protein: 6.3 g/dL — ABNORMAL LOW (ref 6.5–8.1)

## 2021-07-30 LAB — FERRITIN: Ferritin: 101 ng/mL (ref 11–307)

## 2021-07-30 LAB — SAMPLE TO BLOOD BANK

## 2021-07-30 MED ORDER — SODIUM CHLORIDE 0.9 % IV SOLN
4.6000 mg/kg | Freq: Once | INTRAVENOUS | Status: AC
  Administered 2021-07-30: 300 mg via INTRAVENOUS
  Filled 2021-07-30: qty 12

## 2021-07-30 MED ORDER — SODIUM CHLORIDE 0.9% FLUSH
10.0000 mL | INTRAVENOUS | Status: DC | PRN
  Administered 2021-07-30: 10 mL

## 2021-07-30 MED ORDER — HEPARIN SOD (PORK) LOCK FLUSH 100 UNIT/ML IV SOLN
500.0000 [IU] | Freq: Once | INTRAVENOUS | Status: AC | PRN
  Administered 2021-07-30: 500 [IU]

## 2021-07-30 MED ORDER — SODIUM CHLORIDE 0.9 % IV SOLN
510.0000 mg | Freq: Once | INTRAVENOUS | Status: AC
  Administered 2021-07-30: 510 mg via INTRAVENOUS
  Filled 2021-07-30: qty 510

## 2021-07-30 MED ORDER — SODIUM CHLORIDE 0.9 % IV SOLN: Freq: Once | INTRAVENOUS | Status: AC

## 2021-07-30 NOTE — Progress Notes (Signed)
[place]   [phone] [phone]   Clinic Follow up Note   Patient Care Team: Mike Craze, DO as PCP - General (Internal Medicine) [name], MD as PCP - Cardiology (Cardiology) Truitt Merle, MD as Consulting Physician (Hematology) Elayne Snare, MD as Consulting Physician (Endocrinology) 07/30/2021  CHIEF COMPLAINT: Follow up anemia due to chronic blood loss and HHT  CURRENT THERAPY:  1. Iv feraheme as needed if ferritin<100 2. Avastin '5mg'$ /kg, starting 05/09/18.              -given intermittently due to protenuria 3. Blood transfusion if Hg<8.0   INTERVAL HISTORY: [name] returns for follow up as scheduled. Last seen by Dr. Burr Medico 07/01/21 and received bevacizumab and feraheme.  She resides at [facility] for at least the past 6 months, her niece notes that she lives a sedentary lifestyle.  She is mostly sedentary in the bed or chair, needs assistance for ADLs and activities.  She has fallen about 5 times, most recently required an ED visit 07/14/2021 for knee pain, x-rays were negative for fracture.  She does not do PT or OT.  She is not eating much at the facility due to disliking the food, family brings food in.  Niece states she has lost 20 to 30 pounds since she has lived there.  [name] denies pain, bleeding, new cough, chest pain, dyspnea, recent fever or infection.  She has occasional light nosebleed but nothing major.  All other systems were reviewed with the patient and are negative.  MEDICAL HISTORY:  Past Medical History:  Diagnosis Date   Chronic anemia    Chronic diastolic CHF (congestive heart failure) ([place]) 10/03/2013   Chronic GI bleeding    /notes 11/29/2014   Family history of anesthesia complication    "niece has a hard time coming out" (09/15/2012)   Frequent nosebleeds    chronic   Gastric AV malformation    /notes 11/29/2014   GERD (gastroesophageal reflux disease)    Heart murmur 04/01/2017   Moderate AVSC on  echo 09/2016   History of blood transfusion "several"   History of epistaxis    HTN (hypertension), benign 03/02/2012   Hyperlipidemia    Iron deficiency anemia    chronic infusions"   Lichen planus    Both lower extremities   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    SURGICAL HISTORY: Past Surgical History:  Procedure Laterality Date   CATARACT EXTRACTION     "I think it was just one eye"   ESOPHAGOGASTRODUODENOSCOPY  02/26/2011   Procedure: ESOPHAGOGASTRODUODENOSCOPY (EGD);  Surgeon: [name];  [facility];  Service: Endoscopy;  Laterality: N/A;   ESOPHAGOGASTRODUODENOSCOPY N/A 11/08/2012   Procedure: ESOPHAGOGASTRODUODENOSCOPY (EGD);  Surgeon: [name];  [facility];  Service: Endoscopy;  Laterality: N/A;   ESOPHAGOGASTRODUODENOSCOPY N/A 10/04/2013   Procedure: ESOPHAGOGASTRODUODENOSCOPY (EGD);  Surgeon: [name];  [facility];  Service: Endoscopy;  Laterality: N/A;  with APC on stand-by   ESOPHAGOGASTRODUODENOSCOPY N/A 07/06/2014   Procedure: ESOPHAGOGASTRODUODENOSCOPY (EGD);  Surgeon: [name];  [name];  Service: Endoscopy;  Laterality: N/A;   ESOPHAGOGASTRODUODENOSCOPY N/A 09/05/2014   Procedure: ESOPHAGOGASTRODUODENOSCOPY (EGD);  Surgeon: Laurence Spates, MD;  [name];  Service: Endoscopy;  Laterality: N/A;  APC on standby to control bleeding   ESOPHAGOGASTRODUODENOSCOPY N/A 11/29/2014   Procedure: ESOPHAGOGASTRODUODENOSCOPY (EGD);  Surgeon: [name], MD;  [facility]COPY;  Service: Endoscopy;  Laterality: N/A;   ESOPHAGOGASTRODUODENOSCOPY N/A 09/28/2015   Procedure:  ESOPHAGOGASTRODUODENOSCOPY (EGD);  Surgeon: Clarene Essex, MD;  Location: [facility] END[facility]COPY;  Service: Endoscopy;  Laterality: N/A;   ESOPHAGOGASTRODUODENOSCOPY (EGD) WITH PROPOFOL N/A 12/04/2017   Procedure: ESOPHAGOGASTRODUODENOSCOPY (EGD) WITH PROPOFOL;  Surgeon: [name], MD;  Location: [place];  Service: Endoscopy;  Laterality: N/A;   ESOPHAGOGASTRODUODENOSCOPY (EGD) WITH PROPOFOL N/A 12/24/2020   Procedure: ESOPHAGOGASTRODUODENOSCOPY (EGD) WITH PROPOFOL;  Surgeon: Arta Silence, MD;  Location: [place];  Service: Endoscopy;  Laterality: N/A;   ESOPHAGOGASTRODUODENOSCOPY (EGD) WITH PROPOFOL N/A 01/19/2021   Procedure: ESOPHAGOGASTRODUODENOSCOPY (EGD) WITH PROPOFOL;  Surgeon: [name], MD;  Location: [place];  Service: Endoscopy;  Laterality: N/A;   ESOPHAGOGASTRODUODENOSCOPY (EGD) WITH PROPOFOL N/A 02/06/2021   Procedure: ESOPHAGOGASTRODUODENOSCOPY (EGD) WITH PROPOFOL;  Surgeon: Ronnette Juniper, MD;  Location: [place];  Service: Gastroenterology;  Laterality: N/A;   ESOPHAGOGASTRODUODENOSCOPY ENDOSCOPY  08/19/2006   with laser treatment   HEMOSTASIS CLIP PLACEMENT  12/24/2020   Procedure: HEMOSTASIS CLIP PLACEMENT;  Surgeon: Arta Silence, MD;  Location: [facility] ENDOSCOPY;  Service: Endoscopy;;   HEMOSTASIS CLIP PLACEMENT  01/19/2021   Procedure: HEMOSTASIS CLIP PLACEMENT;  Surgeon: [name], MD;  Location: [place];  Service: Endoscopy;;   HOT HEMOSTASIS N/A 07/06/2014   Procedure: HOT HEMOSTASIS (ARGON PLASMA COAGULATION/BICAP);  Surgeon: Clarene Essex, MD;  [name];  Service: Endoscopy;  Laterality: N/A;   HOT HEMOSTASIS N/A 09/28/2015   Procedure: HOT HEMOSTASIS (ARGON PLASMA COAGULATION/BICAP);  Surgeon: Clarene Essex, MD;  Location: [facility] ENDOSCOPY;  Service: Endoscopy;  Laterality: N/A;   HOT HEMOSTASIS N/A 12/04/2017   Procedure: HOT HEMOSTASIS (ARGON PLASMA COAGULATION/BICAP);  Surgeon: [name], MD;  Location: [place];   Service: Endoscopy;  Laterality: N/A;   IR ANGIOGRAM SELECTIVE EACH ADDITIONAL VESSEL  02/05/2021   IR ANGIOGRAM VISCERAL SELECTIVE  02/05/2021   IR EMBO ART  VEN HEMORR LYMPH EXTRAV  INC GUIDE ROADMAPPING  02/05/2021   IR US GUIDE VASC ACCESS RIGHT  02/05/2021   NASAL HEMORRHAGE CONTROL     "for bleeding"    SAVORY DILATION  02/26/2011   Procedure: SAVORY DILATION;  Surgeon: [name], MD;  [name];  Service: Endoscopy;  Laterality: N/A;  c-arm needed   SCLEROTHERAPY  12/24/2020   Procedure: SCLEROTHERAPY;  Surgeon: Arta Silence, MD;  Location: [facility] ENDOSCOPY;  Service: Endoscopy;;   SCLEROTHERAPY  01/19/2021   Procedure: Clide Deutscher;  Surgeon: [name], MD;  Location[facility] END[facility]COPY;  Service: Endoscopy;;   SUBMUCOSAL INJECTION  12/04/2017   Procedure: SUBMUCOSAL INJECTION;  Surgeon: [name], MD;  Location: [facility] ENDOSCOPY;  Service: Endoscopy;;    I have reviewed the social history and family history with the patient and they are unchanged from previous note.  ALLERGIES:  is allergic to aspirin.  MEDICATIONS:  Current Outpatient Medications  Medication Sig Dispense Refill   amLODipine (NORVASC) 2.5 MG tablet Take 1 tablet (2.5 mg total) by mouth daily. 30 tablet 11   diclofenac Sodium (VOLTAREN) 1 % GEL Apply 2 g topically 4 (four) times daily. 100 g 1   Dulaglutide (TRULICITY) 1.5 DG/3.8VF SOPN Inject 1.5 mg into the skin every  Thursday.     gabapentin (NEURONTIN) 100 MG capsule Take 1 capsule (100 mg total) by mouth 2 (two) times daily.     insulin lispro (HUMALOG KWIKPEN) 100 UNIT/ML KwikPen Inject 2 Units into the skin 3 (three) times daily. (Patient taking differently: Inject 3 Units into the skin 3 (three) times daily.)     Insulin Lispro Prot & Lispro (HUMALOG MIX 75/25 KWIKPEN) (75-25) 100 UNIT/ML Kwikpen Inject 12 Units into the skin every morning.     lactulose (CEPHULAC) 10 g packet Take 1 packet (10 g total) by mouth daily. 30 each 0    LANTUS SOLOSTAR 100 UNIT/ML Solostar Pen Inject 10 Units into the skin at bedtime.     levETIRAcetam (KEPPRA) 500 MG tablet Take 1 tablet (500 mg total) by mouth 2 (two) times daily.     memantine (NAMENDA) 10 MG tablet TAKE 1 TABLET BY MOUTH TWICE DAILY 60 tablet 10   mirtazapine (REMERON) 7.5 MG tablet Take 7.5 mg by mouth at bedtime.     Misc Natural Products (IMMUNE FORMULA PO) Take 1 tablet by mouth daily.     Multiple Vitamins-Minerals (MULTI FOR HER 50+) TABS Take 1 tablet by mouth daily with breakfast.     NON FORMULARY See admin instructions. Compression Stockings apply in the morning and remove at bedtime     pantoprazole (PROTONIX) 40 MG tablet Take 1 tablet (40 mg total) by mouth 2 (two) times daily. 60 tablet 2   torsemide (DEMADEX) 20 MG tablet Take 1 tablet (20 mg total) by mouth as needed (please take 1 '20mg'$  tablet as needed for weight gain of 2 pounds overnight or 5 pounds in a week). 180 tablet 3   traMADol (ULTRAM) 50 MG tablet Take 50 mg by mouth every 6 (six) hours as needed.     TRULICITY 3 ZJ/6.9CV SOPN Inject into the skin.     No current facility-administered medications for this visit.   Facility-Administered Medications Ordered in Other Visits  Medication Dose Route Frequency Provider Last Rate Last Admin   bevacizumab-awwb (MVASI) 300 mg in sodium chloride 0.9 % 100 mL chemo infusion  4.6 mg/kg (Treatment Plan Recorded) Intravenous Once Truitt Merle, MD       ferumoxytol [facility]) 510 mg in sodium chloride 0.9 % 100 mL IVPB  510 mg Intravenous Once Truitt Merle, MD       heparin lock flush 100 unit/mL  500 Units Intracatheter Once PRN Truitt Merle, MD       sodium chloride 0.9 % injection 10 mL  10 mL Intracatheter PRN Truitt Merle, MD       sodium chloride flush (NS) 0.9 % injection 10 mL  10 mL Intracatheter PRN Truitt Merle, MD   10 mL at 08/18/19 1717   sodium chloride flush (NS) 0.9 % injection 10 mL  10 mL Intracatheter PRN Truitt Merle, MD        PHYSICAL EXAMINATION: ECOG  PERFORMANCE STATUS: 3 - Symptomatic, >50% confined to bed  Vitals:   07/30/21 1013  BP: 136/68  Pulse: (!) 58  Resp: 16  Temp: 97.8 F (36.6 C)  SpO2: 100%   There were no vitals filed for this visit.  GENERAL:alert, no distress and comfortable SKIN: skin color, texture, turgor are normal, no rashes or significant lesions EYES: normal, Conjunctiva are pink and non-injected, sclera clear OROPHARYNX:no exudate, no erythema and lips, buccal mucosa, and tongue normal  NECK: supple, thyroid normal size, non-tender, without nodularity LYMPH:  no palpable lymphadenopathy in  the cervical, axillary or inguinal LUNGS: clear to auscultation and percussion with normal breathing effort HEART: regular rate & rhythm and no murmurs and no lower extremity edema ABDOMEN:abdomen soft, non-tender and normal bowel sounds Musculoskeletal:no cyanosis of digits and no clubbing  NEURO: alert & oriented x 3 with fluent speech, no focal motor/sensory deficits  LABORATORY DATA:  I have reviewed the data as listed    Latest Ref Rng & Units 07/30/2021   10:03 AM 07/01/2021    9:19 AM 05/05/2021   11:55 AM  CBC  WBC 4.0 - 10.5 K/uL 4.9  6.5  7.2   Hemoglobin 12.0 - 15.0 g/dL 12.7  9.4  10.7   Hematocrit 36.0 - 46.0 % 39.3  30.2  34.8   Platelets 150 - 400 K/uL 136  217  216         Latest Ref Rng & Units 07/30/2021   10:03 AM 07/01/2021    9:19 AM 06/24/2021    3:32 PM  CMP  Glucose 70 - 99 mg/dL 294  172  165   BUN 8 - 23 mg/dL '17  13  14   '$ Creatinine 0.44 - 1.00 mg/dL 1.15  0.93  0.83   Sodium 135 - 145 mmol/L 140  144  142   Potassium 3.5 - 5.1 mmol/L 4.1  4.1  4.7   Chloride 98 - 111 mmol/L 106  110  106   CO2 22 - 32 mmol/L '30  29  24   '$ Calcium 8.9 - 10.3 mg/dL 9.5  9.3  9.4   Total Protein 6.5 - 8.1 g/dL 6.3  6.2    Total Bilirubin 0.3 - 1.2 mg/dL 0.7  0.6    Alkaline Phos 38 - 126 U/L 111  105    AST 15 - 41 U/L 36  23    ALT 0 - 44 U/L 13  10        RADIOGRAPHIC STUDIES: I have  personally reviewed the radiological images as listed and agreed with the findings in the report. No results found.   ASSESSMENT & PLAN: [name] is a 81 y.o. female with      1. Hereditary hemorrhagic telangiectasia (HHT)), with epistaxis and frequent GI bleeding -controlled with Avastin PRN, IV Feraheme PRN for ferritin <100. And RBC transfusion for Hgb < 8  -Ms. Bair appears deconditioned, but otherwise medically stable, no signs of active bleeding. CBC is normal today, although I question false elevation to normal if she is mildly dehydrated. -she has responded well to beva/Feraheme and tolerates it well. Last urine protein <30. Will proceed with beva/Feraheme today as planned, then change to q6 weeks -I have asked her facility to check CBC in 3 weeks and fax to [place] -f/up in 6 weeks with next beva/feraheme   2. Iron deficiency anemia secondary to #1, IV Feraheme if ferritin <100  -Receives IV ferritin as needed for ferritin <100 -most recently on 07/01/2021 for ferritin of 13, will will receive another dose today  3. Dementia  -Appears worse (I last saw her in 2022), currently residing in [facility], sedentary, deconditioned, frequent fall -I have requested for pt to begin PT/OT at her facility if that is an option, her niece was not sure if that is offered   4. DM, CHF, CKD stage III and HTN secondary to Avastin - followed by Dr. Dwyane Dee -Scr up to 1.15 today, likely mild dehydration  5. Worsening proteinuria - likely secondary to avastin  -  Improved lately, last urine protein < 30 on 07/01/21   PLAN: -labs reviewed -proceed with bevacizumab and Feraheme today as planned -Facility forms completed and given to pt to check CBC in 3 weeks, fax to [place], and start PT/OT if available -F/up and next beva/feraheme in 6 weeks    All questions were answered. The patient knows to call the clinic with any problems, questions or concerns. No barriers to learning was  detected. I spent 20 minutes counseling the patient face to face. The total time spent in the appointment was 30 minutes and more than 50% was on counseling and review of test results     Alla Feeling, NP 07/30/21

## 2021-07-30 NOTE — Patient Instructions (Signed)
[facility]  Discharge Instructions: Thank you for choosing [facility] to provide your oncology and hematology care.   If you have a lab appointment with the [facility], please go directly to the [facility] and check in at the registration area.   Wear comfortable clothing and clothing appropriate for easy access to any Portacath or PICC line.   We strive to give you quality time with your provider. You may need to reschedule your appointment if you arrive late (15 or more minutes).  Arriving late affects you and other patients whose appointments are after yours.  Also, if you miss three or more appointments without notifying the office, you may be dismissed from the clinic at the provider's discretion.      For prescription refill requests, have your pharmacy contact our office and allow 72 hours for refills to be completed.    Today you received the following chemotherapy and/or immunotherapy agents: Mvasi.       To help prevent nausea and vomiting after your treatment, we encourage you to take your nausea medication as directed.  BELOW ARE SYMPTOMS THAT SHOULD BE REPORTED IMMEDIATELY: *FEVER GREATER THAN 100.4 F (38 C) OR HIGHER *CHILLS OR SWEATING *NAUSEA AND VOMITING THAT IS NOT CONTROLLED WITH YOUR NAUSEA MEDICATION *UNUSUAL SHORTNESS OF BREATH *UNUSUAL BRUISING OR BLEEDING *URINARY PROBLEMS (pain or burning when urinating, or frequent urination) *BOWEL PROBLEMS (unusual diarrhea, constipation, pain near the anus) TENDERNESS IN MOUTH AND THROAT WITH OR WITHOUT PRESENCE OF ULCERS (sore throat, sores in mouth, or a toothache) UNUSUAL RASH, SWELLING OR PAIN  UNUSUAL VAGINAL DISCHARGE OR ITCHING   Items with * indicate a potential emergency and should be followed up as soon as possible or go to the Emergency Department if any problems should occur.  Please show the CHEMOTHERAPY ALERT CARD or IMMUNOTHERAPY ALERT CARD at check-in to  the Emergency Department and triage nurse.  Should you have questions after your visit or need to cancel or reschedule your appointment, please contact [facility]  Dept: [phone]  and follow the prompts.  Office hours are 8:00 a.m. to 4:30 p.m. Monday - Friday. Please note that voicemails left after 4:00 p.m. may not be returned until the following business day.  We are closed weekends and major holidays. You have access to a nurse at all times for urgent questions. Please call the main number to the clinic Dept: [phone] and follow the prompts.   For any non-urgent questions, you may also contact your provider using MyChart. We now offer e-Visits for anyone 77 and older to request care online for non-urgent symptoms. For details visit [email or web address].   Also download the MyChart app! Go to the app store, search "MyChart", open the app, select [facility], and log in with your MyChart username and password.  Masks are optional in the cancer centers. If you would like for your care team to wear a mask while they are taking care of you, please let them know. For doctor visits, patients may have with them one support person who is at least 81 years old. At this time, visitors are not allowed in the infusion area. Ferumoxytol Injection What is this medication? FERUMOXYTOL (FER ue MOX i tol) treats low levels of iron in your body (iron deficiency anemia). Iron is a mineral that plays an important role in making red blood cells, which carry oxygen from your lungs to the rest of your body. This  medicine may be used for other purposes; ask your health care provider or pharmacist if you have questions. COMMON BRAND NAME(S): Feraheme What should I tell my care team before I take this medication? They need to know if you have any of these conditions: Anemia not caused by low iron levels High levels of iron in the blood Magnetic resonance imaging (MRI) test  scheduled An unusual or allergic reaction to iron, other medications, foods, dyes, or preservatives Pregnant or trying to get pregnant Breast-feeding How should I use this medication? This medication is for injection into a vein. It is given in a hospital or clinic setting. Talk to your care team the use of this medication in children. Special care may be needed. Overdosage: If you think you have taken too much of this medicine contact a poison control center or emergency room at once. NOTE: This medicine is only for you. Do not share this medicine with others. What if I miss a dose? It is important not to miss your dose. Call your care team if you are unable to keep an appointment. What may interact with this medication? Other iron products This list may not describe all possible interactions. Give your health care provider a list of all the medicines, herbs, non-prescription drugs, or dietary supplements you use. Also tell them if you smoke, drink alcohol, or use illegal drugs. Some items may interact with your medicine. What should I watch for while using this medication? Visit your care team regularly. Tell your care team if your symptoms do not start to get better or if they get worse. You may need blood work done while you are taking this medication. You may need to follow a special diet. Talk to your care team. Foods that contain iron include: whole grains/cereals, dried fruits, beans, or peas, leafy green vegetables, and organ meats (liver, kidney). What side effects may I notice from receiving this medication? Side effects that you should report to your care team as soon as possible: Allergic reactions--skin rash, itching, hives, swelling of the face, lips, tongue, or throat Low blood pressure--dizziness, feeling faint or lightheaded, blurry vision Shortness of breath Side effects that usually do not require medical attention (report to your care team if they continue or are  bothersome): Flushing Headache Joint pain Muscle pain Nausea Pain, redness, or irritation at injection site This list may not describe all possible side effects. Call your doctor for medical advice about side effects. You may report side effects to FDA at 1-800-FDA-1088. Where should I keep my medication? This medication is given in a hospital or clinic and will not be stored at home. NOTE: This sheet is a summary. It may not cover all possible information. If you have questions about this medicine, talk to your doctor, pharmacist, or health care provider.  2023 Elsevier/Gold Standard (2020-06-21 00:00:00)

## 2021-07-30 NOTE — Progress Notes (Signed)
Per Lacie NP, ok to proceed with Mvasi treatment today with Urine Protein results from 5/23.   Called [facility] to arrange transportation home for pt.

## 2021-08-01 ENCOUNTER — Non-Acute Institutional Stay: Payer: Medicare Other | Admitting: Hospice

## 2021-08-01 DIAGNOSIS — F0390 Unspecified dementia without behavioral disturbance: Secondary | ICD-10-CM

## 2021-08-01 DIAGNOSIS — Z515 Encounter for palliative care: Secondary | ICD-10-CM

## 2021-08-01 DIAGNOSIS — E119 Type 2 diabetes mellitus without complications: Secondary | ICD-10-CM

## 2021-08-01 DIAGNOSIS — I5032 Chronic diastolic (congestive) heart failure: Secondary | ICD-10-CM

## 2021-08-01 NOTE — Progress Notes (Signed)
Therapist, nutritional Palliative Care Consult Note Telephone: [phone]  Fax: [phone]  PATIENT NAME: [name] [address] [place] [address] [phone] (home)  DOB: 04-May-1940 MRN: [id number]  PRIMARY CARE PROVIDER:    [name], Areeg N, DO,  [address] [place] [address] [phone]  REFERRING PROVIDER:  Bonnita Nasuti, NP   RESPONSIBLE PARTY:   Self Contact: Tabitha [phone]  Contact Information     Name Relation Home Work Mobile   Mebanes,Denise Niece   [phone]   Adolm Joseph Daughter [phone]  [phone]   Dhwani, Marschall [phone]  [phone]   Carmin Richmond Other [phone]        I met face to face with patient and at facility. Palliative Care was asked to follow this patient by consultation request of  Lowella Petties NP to address advance care planning, complex medical decision making and goals of care clarification.  NP called Tabitha and left voicemail with call back number. She expressed appreciation for the call.     ASSESSMENT AND / RECOMMENDATIONS:   CODE STATUS: Patient is a Full code  Goals of Care: Goals include to maximize quality of life and symptom management  Symptom Management/Plan: Dementia:  Continue  memantine as currently ordered. Memory loss/confusion is ongoing, incontinent of bowel and bladder FAST 6D. Encourage reminiscence, word search/puzzles, cueing for recollection.  Promote calm approach and engaging environment.  Safety and fall precautions.  Optimize ongoing supportive care. Gait disturbance: No report of fall since last visit. Fall precautions in place.  Maintain safe physical activity as tolerated to maintain muscle mass.  Provide assistance as needed for transfers. Type 2 DM: Stable A1c 5.8 06/26/2021, 6.2 02/05/21. Continue Trulicity, Insulin as ordered. No concentrated sweets. Diabetic diet, no empty caloric beverages. Check CBG as ordered to help  ensure tight control.  Repeat A1c in 3 to 6 months. CFH: Continue Torsemide as ordered. Elevate BLE during the day as much as possible to promote circulation. Adhere to Fluid and salt limits. No weight gain since last visit. Monitor weight closely and report weight gain of 3 Ibs in a day or 5 Ibs in a week  to eval for additional diuresis  Follow up: Palliative care will continue to follow for complex medical decision making, advance care planning, and clarification of goals. Return 6 weeks or prn. Encouraged to call provider sooner with any concerns.   Family /Caregiver/Community Supports: Patient is in SNF for ongoing care  HOSPICE ELIGIBILITY/DIAGNOSIS: TBD  Chief Complaint: Follow up visit  HISTORY OF PRESENT ILLNESS:  [name] is a 81 y.o. year old female  with multiple morbidities requiring close monitoring/management with high risk of complications and morbidity: Advanced Dementia,Type 2 DM. CHF, Anemia, gait disturbance.  Patient denies pain/discomfort. Patient was recently assessed for hospice service but did not qualify; patient continues to sleep more, able to feed set on set up; with no urgent needs.History obtained from review of EMR, discussion with primary team, caregiver, family and/or Ms. Ucci.  Review and summarization of Epic records shows history from other than patient. Rest of 10 point ROS asked and negative.  I reviewed as needed, available labs, patient records, imaging, studies and related documents from the EMR.   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   Fatigue 07/10/2011   Type 2 diabetes mellitus without complication ([name] 09/15/2012  Chronic diastolic CHF (congestive heart failure) ([name] 10/03/2013   Acute on chronic heart failure with preserved ejection fraction (HFpEF) ([name] 10/03/2013   Acute kidney injury ([name] 08/27/2014    Hyperammonemia ([name] 10/03/2014   HTN (hypertension), benign 03/02/2012   CKD (chronic kidney disease), stage III ([name]    Simple febrile convulsions ([name] 10/21/2014   Memory loss 01/30/2015   Healthcare maintenance 06/20/2015   Seborrheic keratosis 06/20/2015   Lichen planus 07/17/2015   PAF (paroxysmal atrial fibrillation) ([name] 09/29/2015   Pain in joint of right shoulder 10/22/2015   Peripheral vascular disease ([name] 12/30/2015   Dry skin 02/17/2016   Anemia due to chronic blood loss    Venous insufficiency of both lower extremities 04/27/2016   Epistaxis 07/30/2016   Osteopenia after menopause 01/13/2017   Overgrown toenails 03/18/2017   Pulmonary HTN ([name] 04/01/2017   Heart murmur 04/01/2017   Lower extremity pain, bilateral 07/15/2017   Pain localized to upper abdomen 10/05/2017   Hematemesis 12/04/2017   Leg weakness, bilateral 12/23/2017   Hyperpigmented skin lesion 02/07/2018   Acute right hip pain 03/10/2018   Iron deficiency anemia due to chronic blood loss 04/19/2018   Port-A-Cath in place 05/23/2018   Hyperkalemia 10/18/2018   Neuropathy 07/27/2019   Seizures ([name] 09/2014   Hearing loss 05/29/2020   Bullous lesion 10/08/2020   GI bleed 01/18/2021   AKI (acute kidney injury) ([name] 01/28/2021   Altered mental status 02/03/2021   AMS (altered mental status) 02/04/2021   Resolved Ambulatory Problems    Diagnosis Date Noted   Type 2 diabetes mellitus with complication, with long-term current use of insulin ([name] 04/08/2006   HYPERLIPIDEMIA 04/08/2006   Anemia, iron deficiency 04/21/2006   Iron deficiency anemia due to chronic blood loss 04/08/2006   HYPERTENSION, BENIGN SYSTEMIC 04/08/2006   SYMPTOM, EPISTAXIS 05/21/2006   Anemia 02/01/2011   GI bleed 02/01/2011   Hypokalemia 02/01/2011   Obesity 09/15/2012   Abnormal CXR 09/15/2012   Dyspnea 10/03/2013   Upper GI bleed 07/05/2014   Poor venous access    SOB (shortness of breath) 07/27/2014   Acute  encephalopathy 08/27/2014   Acute renal failure superimposed on stage 3 chronic kidney disease ([name] 08/27/2014   Anemia of chronic disease 08/27/2014   UTI (lower urinary tract infection) 08/27/2014   Anemia 09/04/2014   Acute lower GI bleeding 09/04/2014   Acute blood loss anemia    Status epilepticus ([name] 10/03/2014   Hypotension 11/13/2014   Symptomatic anemia 11/29/2014   Bleeding gastrointestinal    SOB (shortness of breath) 01/18/2015   Acute GI bleeding 09/28/2015   Angiodysplasia of stomach with hemorrhage    AVM (arteriovenous malformation) of stomach, acquired with hemorrhage    Hyperglycemia due to type 2 diabetes mellitus ([name] 05/21/2016   Muscle spasm of both lower legs 06/15/2016   Hypervolemia due to congestive heart failure ([name] 08/02/2019   Abnormal LFTs 08/09/2019   Impacted cerumen of left ear 07/15/2020   Hypoglycemia 09/29/2020   Acute upper GI bleed 12/23/2020   Hemorrhagic shock ([name]    Altered mental status 12/31/2020   Acute metabolic encephalopathy 01/09/2021   AKI (acute kidney injury) ([name]    Dehydration    Past Medical History:  Diagnosis Date   Chronic anemia    Chronic GI bleeding    Family history of anesthesia complication    Frequent nosebleeds    Gastric AV malformation    GERD (gastroesophageal reflux disease)    History of blood transfusion "several"  History of epistaxis    Hyperlipidemia    Iron deficiency anemia    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[name]    Pneumonia 1990's X 2   Telangiectasia    Type II diabetes mellitus ([name]     SOCIAL HX:  Social History   Tobacco Use   Smoking status: Former    Packs/day: 1.00    Years: 20.00    Total pack years: 20.00    Types: Cigarettes    Quit date: 02/10/1971    Years since quitting: 50.5   Smokeless tobacco: Never   Tobacco comments:    09/15/2012 "smoked 50-60 yr ago"  Substance Use Topics   Alcohol use: No    Alcohol/week: 0.0 standard drinks of alcohol     FAMILY HX:   Family History  Problem Relation Age of Onset   Healthy Mother    Stroke Father    Diabetes Niece    Breast cancer Other    Malignant hyperthermia Neg Hx    Seizures Neg Hx    Lung cancer Neg Hx       ALLERGIES:  Allergies  Allergen Reactions   Aspirin Nausea And Vomiting      PERTINENT MEDICATIONS:  Outpatient Encounter Medications as of 08/01/2021  Medication Sig   amLODipine (NORVASC) 2.5 MG tablet Take 1 tablet (2.5 mg total) by mouth daily.   diclofenac Sodium (VOLTAREN) 1 % GEL Apply 2 g topically 4 (four) times daily.   Dulaglutide (TRULICITY) 1.5 MG/0.5ML SOPN Inject 1.5 mg into the skin every Thursday.   gabapentin (NEURONTIN) 100 MG capsule Take 1 capsule (100 mg total) by mouth 2 (two) times daily.   insulin lispro (HUMALOG KWIKPEN) 100 UNIT/ML KwikPen Inject 2 Units into the skin 3 (three) times daily. (Patient taking differently: Inject 3 Units into the skin 3 (three) times daily.)   Insulin Lispro Prot & Lispro (HUMALOG MIX 75/25 KWIKPEN) (75-25) 100 UNIT/ML Kwikpen Inject 12 Units into the skin every morning.   lactulose (CEPHULAC) 10 g packet Take 1 packet (10 g total) by mouth daily.   LANTUS SOLOSTAR 100 UNIT/ML Solostar Pen Inject 10 Units into the skin at bedtime.   levETIRAcetam (KEPPRA) 500 MG tablet Take 1 tablet (500 mg total) by mouth 2 (two) times daily.   memantine (NAMENDA) 10 MG tablet TAKE 1 TABLET BY MOUTH TWICE DAILY   mirtazapine (REMERON) 7.5 MG tablet Take 7.5 mg by mouth at bedtime.   Misc Natural Products (IMMUNE FORMULA PO) Take 1 tablet by mouth daily.   Multiple Vitamins-Minerals (MULTI FOR HER 50+) TABS Take 1 tablet by mouth daily with breakfast.   NON FORMULARY See admin instructions. Compression Stockings apply in the morning and remove at bedtime   pantoprazole (PROTONIX) 40 MG tablet Take 1 tablet (40 mg total) by mouth 2 (two) times daily.   torsemide (DEMADEX) 20 MG tablet Take 1 tablet (20 mg total) by mouth as needed (please take 1  20mg  tablet as needed for weight gain of 2 pounds overnight or 5 pounds in a week).   traMADol (ULTRAM) 50 MG tablet Take 50 mg by mouth every 6 (six) hours as needed.   TRULICITY 3 MG/0.5ML SOPN Inject into the skin.   Facility-Administered Encounter Medications as of 08/01/2021  Medication   sodium chloride 0.9 % injection 10 mL   sodium chloride flush (NS) 0.9 % injection 10 mL   I spent 45 minutes providing this consultation; time includes spent with patient/family, chart review and documentation. More than 50%  of the time in this consultation was spent on care coordination  Thank you for the opportunity to participate in the care of Ms. Fess.  The palliative care team will continue to follow. Please call our office at [phone] if we can be of additional assistance.   Note: Portions of this note were generated with Scientist, clinical (histocompatibility and immunogenetics). Dictation errors may occur despite best attempts at proofreading.  Rosaura Carpenter, NP

## 2021-08-06 ENCOUNTER — Encounter (HOSPITAL_BASED_OUTPATIENT_CLINIC_OR_DEPARTMENT_OTHER): Payer: Medicare Other | Admitting: Cardiology

## 2021-08-11 ENCOUNTER — Telehealth: Payer: Self-pay | Admitting: Hospice

## 2021-08-11 NOTE — Telephone Encounter (Signed)
NP called Tabitha to check in on patient; left her a vm with call back number

## 2021-08-19 ENCOUNTER — Ambulatory Visit (HOSPITAL_COMMUNITY): Payer: Medicare Other

## 2021-08-28 ENCOUNTER — Ambulatory Visit (HOSPITAL_COMMUNITY)
Admission: RE | Admit: 2021-08-28 | Discharge: 2021-08-28 | Disposition: A | Discharge status: Home / Self Care | Payer: Medicare Other | Source: Ambulatory Visit | Attending: Internal Medicine | Admitting: Internal Medicine

## 2021-08-28 DIAGNOSIS — R9389 Abnormal findings on diagnostic imaging of other specified body structures: Secondary | ICD-10-CM | POA: Diagnosis present

## 2021-08-28 DIAGNOSIS — R911 Solitary pulmonary nodule: Secondary | ICD-10-CM

## 2021-08-29 ENCOUNTER — Telehealth: Payer: Self-pay | Admitting: Internal Medicine

## 2021-08-29 NOTE — Telephone Encounter (Signed)
See telephone encounter from 08/29/21

## 2021-08-29 NOTE — Telephone Encounter (Signed)
Reviewed results of CT scan. I would like to meet with the patient and her family in person to review the results and discuss next steps. She had declined significantly. Please schedule appt and make sure patient has some family with her when she comes.

## 2021-08-29 NOTE — Telephone Encounter (Signed)
FYI to provider about lung nodule. This is the result.  IMPRESSION: 1. Left upper lobe 2.6 x 2.2 x 2.2 cm nodular masslike opacity at site of prior airspace disease, suspicious for malignancy. There is been interval increase in size from December CT. Recommend neoplastic workup. 2. Punctate subpleural nodule in the right lower lobe is nonspecific. 3. No mediastinal adenopathy. Assessment for hilar adenopathy is limited on this unenhanced exam. 4. Aortic atherosclerosis.  Coronary artery calcifications.   Aortic Atherosclerosis (ICD10-I70.0).

## 2021-08-29 NOTE — Telephone Encounter (Signed)
ATC patient. LVMTCB. 

## 2021-09-01 NOTE — Telephone Encounter (Signed)
Tried calling the pt and there was no answer and no option to leave msg, WCB once more and then mail letter.

## 2021-09-03 ENCOUNTER — Encounter: Payer: Self-pay | Admitting: *Deleted

## 2021-09-03 NOTE — Telephone Encounter (Signed)
ATC, no answer.  LVM to return call.  Unable to reach letter sent.

## 2021-09-04 NOTE — Telephone Encounter (Signed)
ATC patient but there was no answer again. Looked at chart and it looks like a letter was sent to patient in regards to CT scan results.

## 2021-09-10 ENCOUNTER — Other Ambulatory Visit: Payer: Self-pay | Admitting: Hematology

## 2021-09-11 ENCOUNTER — Inpatient Hospital Stay: Payer: Medicare Other

## 2021-09-11 ENCOUNTER — Inpatient Hospital Stay: Payer: Medicare Other | Attending: Hematology | Admitting: Hematology

## 2021-09-11 DIAGNOSIS — I780 Hereditary hemorrhagic telangiectasia: Secondary | ICD-10-CM | POA: Insufficient documentation

## 2021-09-11 DIAGNOSIS — E1122 Type 2 diabetes mellitus with diabetic chronic kidney disease: Secondary | ICD-10-CM | POA: Insufficient documentation

## 2021-09-11 DIAGNOSIS — N183 Chronic kidney disease, stage 3 unspecified: Secondary | ICD-10-CM | POA: Insufficient documentation

## 2021-09-11 DIAGNOSIS — F0390 Unspecified dementia without behavioral disturbance: Secondary | ICD-10-CM | POA: Insufficient documentation

## 2021-09-11 DIAGNOSIS — R911 Solitary pulmonary nodule: Secondary | ICD-10-CM | POA: Insufficient documentation

## 2021-09-11 DIAGNOSIS — D500 Iron deficiency anemia secondary to blood loss (chronic): Secondary | ICD-10-CM | POA: Insufficient documentation

## 2021-09-11 DIAGNOSIS — I5032 Chronic diastolic (congestive) heart failure: Secondary | ICD-10-CM | POA: Insufficient documentation

## 2021-09-11 DIAGNOSIS — I130 Hypertensive heart and chronic kidney disease with heart failure and stage 1 through stage 4 chronic kidney disease, or unspecified chronic kidney disease: Secondary | ICD-10-CM | POA: Insufficient documentation

## 2021-09-15 ENCOUNTER — Telehealth: Payer: Self-pay | Admitting: Internal Medicine

## 2021-09-15 ENCOUNTER — Ambulatory Visit: Payer: Medicare Other | Admitting: Internal Medicine

## 2021-09-15 NOTE — Telephone Encounter (Signed)
[name] is not listed on DPR. Attempted to contact her to make her aware-unable to leave vm due to mailbox not being setup.    Spoke to patient and received verbal from patient to speak to niece, [name].

## 2021-09-17 ENCOUNTER — Other Ambulatory Visit: Payer: Self-pay

## 2021-09-17 ENCOUNTER — Inpatient Hospital Stay: Payer: Medicare Other

## 2021-09-17 ENCOUNTER — Inpatient Hospital Stay (HOSPITAL_BASED_OUTPATIENT_CLINIC_OR_DEPARTMENT_OTHER): Payer: Medicare Other | Admitting: Hematology

## 2021-09-17 VITALS — BP 177/67 | HR 54 | Temp 97.7°F | Wt 124.8 lb

## 2021-09-17 DIAGNOSIS — R911 Solitary pulmonary nodule: Secondary | ICD-10-CM | POA: Diagnosis not present

## 2021-09-17 DIAGNOSIS — I780 Hereditary hemorrhagic telangiectasia: Secondary | ICD-10-CM

## 2021-09-17 DIAGNOSIS — Z95828 Presence of other vascular implants and grafts: Secondary | ICD-10-CM

## 2021-09-17 DIAGNOSIS — D500 Iron deficiency anemia secondary to blood loss (chronic): Secondary | ICD-10-CM

## 2021-09-17 DIAGNOSIS — F0390 Unspecified dementia without behavioral disturbance: Secondary | ICD-10-CM | POA: Diagnosis not present

## 2021-09-17 DIAGNOSIS — N183 Chronic kidney disease, stage 3 unspecified: Secondary | ICD-10-CM | POA: Diagnosis not present

## 2021-09-17 DIAGNOSIS — K31819 Angiodysplasia of stomach and duodenum without bleeding: Secondary | ICD-10-CM

## 2021-09-17 DIAGNOSIS — E1122 Type 2 diabetes mellitus with diabetic chronic kidney disease: Secondary | ICD-10-CM | POA: Diagnosis not present

## 2021-09-17 DIAGNOSIS — I5032 Chronic diastolic (congestive) heart failure: Secondary | ICD-10-CM | POA: Diagnosis not present

## 2021-09-17 DIAGNOSIS — I130 Hypertensive heart and chronic kidney disease with heart failure and stage 1 through stage 4 chronic kidney disease, or unspecified chronic kidney disease: Secondary | ICD-10-CM | POA: Diagnosis not present

## 2021-09-17 LAB — SAMPLE TO BLOOD BANK

## 2021-09-17 LAB — CBC WITH DIFFERENTIAL (CANCER CENTER ONLY)
Abs Immature Granulocytes: 0.01 10*3/uL (ref 0.00–0.07)
Basophils Absolute: 0 10*3/uL (ref 0.0–0.1)
Basophils Relative: 1 %
Eosinophils Absolute: 0.4 10*3/uL (ref 0.0–0.5)
Eosinophils Relative: 8 %
HCT: 35.7 % — ABNORMAL LOW (ref 36.0–46.0)
Hemoglobin: 12.2 g/dL (ref 12.0–15.0)
Immature Granulocytes: 0 %
Lymphocytes Relative: 18 %
Lymphs Abs: 1 10*3/uL (ref 0.7–4.0)
MCH: 29.9 pg (ref 26.0–34.0)
MCHC: 34.2 g/dL (ref 30.0–36.0)
MCV: 87.5 fL (ref 80.0–100.0)
Monocytes Absolute: 0.5 10*3/uL (ref 0.1–1.0)
Monocytes Relative: 8 %
Neutro Abs: 3.4 10*3/uL (ref 1.7–7.7)
Neutrophils Relative %: 65 %
Platelet Count: 143 10*3/uL — ABNORMAL LOW (ref 150–400)
RBC: 4.08 MIL/uL (ref 3.87–5.11)
RDW: 15.7 % — ABNORMAL HIGH (ref 11.5–15.5)
WBC Count: 5.4 10*3/uL (ref 4.0–10.5)
nRBC: 0 % (ref 0.0–0.2)

## 2021-09-17 LAB — CMP (CANCER CENTER ONLY)
ALT: 16 U/L (ref 0–44)
AST: 26 U/L (ref 15–41)
Albumin: 2.9 g/dL — ABNORMAL LOW (ref 3.5–5.0)
Alkaline Phosphatase: 125 U/L (ref 38–126)
Anion gap: 2 — ABNORMAL LOW (ref 5–15)
BUN: 14 mg/dL (ref 8–23)
CO2: 30 mmol/L (ref 22–32)
Calcium: 8.9 mg/dL (ref 8.9–10.3)
Chloride: 107 mmol/L (ref 98–111)
Creatinine: 0.86 mg/dL (ref 0.44–1.00)
GFR, Estimated: >60 mL/min (ref >60)
Glucose, Bld: 307 mg/dL — ABNORMAL HIGH (ref 70–99)
Potassium: 4.7 mmol/L (ref 3.5–5.1)
Sodium: 139 mmol/L (ref 135–145)
Total Bilirubin: 0.7 mg/dL (ref 0.3–1.2)
Total Protein: 5.9 g/dL — ABNORMAL LOW (ref 6.5–8.1)

## 2021-09-17 LAB — TOTAL PROTEIN, URINE DIPSTICK: Protein, ur: 30 mg/dL — AB

## 2021-09-17 MED ORDER — SODIUM CHLORIDE 0.9% FLUSH
10.0000 mL | INTRAVENOUS | Status: DC | PRN
  Administered 2021-09-17: 10 mL

## 2021-09-17 NOTE — Progress Notes (Signed)
[place]   [phone] [phone]   Clinic Follow up Note   Patient Care Team: [name], MD as PCP - General [name], MD as PCP - Cardiology (Cardiology) Truitt Merle, MD as Consulting Physician (Hematology) Elayne Snare, MD as Consulting Physician (Endocrinology)  Date of Service:  09/17/2021  CHIEF COMPLAINT: f/u of anemia due to chronic blood loss, HHT  CURRENT THERAPY:  1. Iv feraheme as needed if ferritin<10, latest on 07/30/21 2. Avastin '5mg'$ /kg, starting 05/09/18.              -given intermittently due to protenuria 3. Blood transfusion if Hg<8.0  ASSESSMENT & PLAN:  [name] is a 81 y.o. female with   1. Hereditary hemorrhagic telangiectasia (HHT), with epistaxis and frequent GI bleeding -She receives blood transfusion as needed, but has not needed since 04/2018 when she started Beva. -She is on Avastin intermittently/PRN due to high proteinuria. -she last required blood transfusion on 04/17/21. -she received beva and IV Feraheme on 07/01/21 and 07/30/21. She responded well  -Lab reviewed, her hgb is up to 12.2 today, I do not plan to give treatment today. -we will continue to follow her labs monthly, and I will see her back in 3 months.  2. LUL lung nodule, likely malignant  -initially seen in 01/2021, felt to be pneumonia -recent chest CT 08/28/21 showed increase in size to 2.6 cm. I reviewed these results with the patient and family today. -I recommend follow up with her pulmonologist, Dr. Shearon Stalls. I discussed she could proceed with bronchoscopy for diagnosis and possibly radiation for treatment. I will reach out to Dr. Mauricio Po office to let them know I saw and spoke with them today and help set up a f/u appointment.   3. 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 -she last received IV  Feraheme on 07/30/21 -will give for ferritin <10   4. Dementia, Deconditioning -She takes Aricept and memantine for dementia, and Keppra for seizures.  -she is currently living in [facility], since 01/2021     5. Proteinuria, HTN -likely secondary to avastin, but she also has diabetes  -She receives bevacizumab as needed -BP is 177/67 but HR is 54 today (09/17/21)  6. Comorbidities: DM, CHF, CKD stage III -f/u with PCP    Plan -labs reviewed, will hold Bevacizumab and IV Feraheme today -will reach out to Dr. Mauricio Po office to help facilitate f/u  -lab every 4 weeks -f/u in 3 months -I called her niece and spoke with her    No problem-specific Assessment & Plan notes found for this encounter.   INTERVAL HISTORY:  [name] is here for a follow up of anemia and HHT. She was last seen by NP Lacie on 07/30/21. She presents to the clinic accompanied by her husband and an aide from [facility] (her SNF). I connected with their niece [name] by phone. She reports she is not doing much at the facility. She denies cough.   All other systems were reviewed with the patient and are negative.  MEDICAL HISTORY:  Past Medical History:  Diagnosis Date   Chronic anemia    Chronic diastolic CHF (congestive heart failure) ([place]) 10/03/2013   Chronic GI bleeding    /notes 11/29/2014   Family history of anesthesia complication    "niece has a hard time  coming out" (09/15/2012)   Frequent nosebleeds    chronic   Gastric AV malformation    /notes 11/29/2014   GERD (gastroesophageal reflux disease)    Heart murmur 04/01/2017   Moderate AVSC on echo 09/2016   History of blood transfusion "several"   History of epistaxis    HTN (hypertension), benign 03/02/2012   Hyperlipidemia    Iron deficiency anemia    chronic infusions"   Lichen planus    Both lower extremities   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    SURGICAL HISTORY: Past Surgical History:  Procedure Laterality Date   CATARACT EXTRACTION     "I think it was just one eye"   ESOPHAGOGASTRODUODENOSCOPY  02/26/2011   Procedure: ESOPHAGOGASTRODUODENOSCOPY (EGD);  Surgeon: [name];  [facility];  Service: Endoscopy;  Laterality: N/A;   ESOPHAGOGASTRODUODENOSCOPY N/A 11/08/2012   Procedure: ESOPHAGOGASTRODUODENOSCOPY (EGD);  Surgeon: [name];  [facility];  Service: Endoscopy;  Laterality: N/A;   ESOPHAGOGASTRODUODENOSCOPY N/A 10/04/2013   Procedure: ESOPHAGOGASTRODUODENOSCOPY (EGD);  Surgeon: [name];  [facility];  Service: Endoscopy;  Laterality: N/A;  with APC on stand-by   ESOPHAGOGASTRODUODENOSCOPY N/A 07/06/2014   Procedure: ESOPHAGOGASTRODUODENOSCOPY (EGD);  Surgeon: [name];  [facility];  Service: Endoscopy;  Laterality: N/A;   ESOPHAGOGASTRODUODENOSCOPY N/A 09/05/2014   Procedure: ESOPHAGOGASTRODUODENOSCOPY (EGD);  Surgeon: JaLaurence SpatesMD;  [facility];  Service: Endoscopy;  Laterality: N/A;  APC on standby to control bleeding   ESOPHAGOGASTRODUODENOSCOPY N/A 11/29/2014   Procedure: ESOPHAGOGASTRODUODENOSCOPY (EGD);  Surgeon: [name];  [facility];  Service: Endoscopy;  Laterality: N/A;   ESOPHAGOGASTRODUODENOSCOPY N/A 09/28/2015   Procedure: ESOPHAGOGASTRODUODENOSCOPY (EGD);  Surgeon: [name];  [facility];  Service: Endoscopy;  Laterality: N/A;   ESOPHAGOGASTRODUODENOSCOPY (EGD) WITH PROPOFOL N/A 12/04/2017   Procedure: ESOPHAGOGASTRODUODENOSCOPY (EGD) WITH PROPOFOL;  Surgeon: [name];  [facility]: Endoscopy;  Laterality: N/A;    ESOPHAGOGASTRODUODENOSCOPY (EGD) WITH PROPOFOL N/A 12/24/2020   Procedure: ESOPHAGOGASTRODUODENOSCOPY (EGD) WITH PROPOFOL;  Surgeon: OuArta SilenceMD;  [place] [facility]: Endoscopy;  Laterality: N/A;   ESOPHAGOGASTRODUODENOSCOPY (EGD) WITH PROPOFOL N/A 01/19/2021   Procedure: ESOPHAGOGASTRODUODENOSCOPY (EGD) WITH PROPOFOL;  Surgeon: [name];  [facility]: Endoscopy;  Laterality: N/A;   ESOPHAGOGASTRODUODENOSCOPY (EGD) WITH PROPOFOL N/A 02/06/2021   Procedure: ESOPHAGOGASTRODUODENOSCOPY (EGD) WITH PROPOFOL;  Surgeon: KaRonnette JuniperMD;  [facility] Service: Gastroenterology;  Laterality: N/A;   ESOPHAGOGASTRODUODENOSCOPY ENDOSCOPY  08/19/2006   with laser treatment   HEMOSTASIS CLIP PLACEMENT  12/24/2020   Procedure: HEMOSTASIS CLIP PLACEMENT;  Surgeon: OuArta SilenceMD;  [facility];  Service: Endoscopy;;   HEMOSTASIS CLIP PLACEMENT  01/19/2021   Procedure: HEMOSTASIS CLIP PLACEMENT;  Surgeon: [name];  [facility] Service: Endoscopy;;   HOT HEMOSTASIS N/A 07/06/2014   Procedure: HOT HEMOSTASIS (ARGON PLASMA COAGULATION/BICAP);  Surgeon: [name];  [facility];  Service: Endoscopy;  Laterality: N/A;   HOT HEMOSTASIS N/A 09/28/2015   Procedure: HOT HEMOSTASIS (ARGON PLASMA COAGULATION/BICAP);  Surgeon: MaClarene Essex  MD;  Location: [place] ENDOSCOPY;  Service: Endoscopy;  Laterality: N/A;   HOT HEMOSTASIS N/A 12/04/2017   Procedure: HOT HEMOSTASIS (ARGON PLASMA COAGULATION/BICAP);  Surgeon: [name], MD;  Location: [place];  Service: Endoscopy;  Laterality: N/A;   IR ANGIOGRAM SELECTIVE EACH ADDITIONAL VESSEL  02/05/2021   IR ANGIOGRAM VISCERAL SELECTIVE  02/05/2021   IR EMBO ART  VEN HEMORR LYMPH EXTRAV  INC GUIDE ROADMAPPING  02/05/2021   IR US GUIDE VASC ACCESS RIGHT  02/05/2021   NASAL HEMORRHAGE CONTROL     "for bleeding"    SAVORY DILATION  02/26/2011   Procedure:  SAVORY DILATION;  Surgeon: [name], MD;  [name];  Service: Endoscopy;  Laterality: N/A;  c-arm needed   SCLEROTHERAPY  12/24/2020   Procedure: SCLEROTHERAPY;  Surgeon: Arta Silence, MD;  Location[facility]COPY;  Service: Endoscopy;;   SCLEROTHERAPY  01/19/2021   Procedure: Clide Deutscher;  Surgeon: [name], MD;  Location[facility] END[facility]COPY;  Service: Endoscopy;;   SUBMUCOSAL INJECTION  12/04/2017   Procedure: SUBMUCOSAL INJECTION;  Surgeon: [name], MD;  Location[facility]COPY;  Service: Endoscopy;;    I have reviewed the social history and family history with the patient and they are unchanged from previous note.  ALLERGIES:  is allergic to aspirin.  MEDICATIONS:  Current Outpatient Medications  Medication Sig Dispense Refill   amLODipine (NORVASC) 2.5 MG tablet Take 1 tablet (2.5 mg total) by mouth daily. 30 tablet 11   diclofenac Sodium (VOLTAREN) 1 % GEL Apply 2 g topically 4 (four) times daily. 100 g 1   Dulaglutide (TRULICITY) 1.5 DV/7.6HY SOPN Inject 1.5 mg into the skin every Thursday.     gabapentin (NEURONTIN) 100 MG capsule Take 1 capsule (100 mg total) by mouth 2 (two) times daily.     insulin lispro (HUMALOG KWIKPEN) 100 UNIT/ML KwikPen Inject 2 Units into the skin 3 (three) times daily. (Patient taking differently: Inject 3 Units into the skin 3 (three) times daily.)     Insulin Lispro Prot & Lispro (HUMALOG MIX 75/25 KWIKPEN) (75-25) 100 UNIT/ML Kwikpen Inject 12 Units into the skin every morning.     lactulose (CEPHULAC) 10 g packet Take 1 packet (10 g total) by mouth daily. 30 each 0   LANTUS SOLOSTAR 100 UNIT/ML Solostar Pen Inject 10 Units into the skin at bedtime.     levETIRAcetam (KEPPRA) 500 MG tablet Take 1 tablet (500 mg total) by mouth 2 (two) times daily.     memantine (NAMENDA) 10 MG tablet TAKE 1 TABLET BY MOUTH TWICE DAILY 60 tablet 10   mirtazapine (REMERON) 7.5 MG tablet Take 7.5 mg by mouth at bedtime.     Misc Natural  Products (IMMUNE FORMULA PO) Take 1 tablet by mouth daily.     Multiple Vitamins-Minerals (MULTI FOR HER 50+) TABS Take 1 tablet by mouth daily with breakfast.     NON FORMULARY See admin instructions. Compression Stockings apply in the morning and remove at bedtime     pantoprazole (PROTONIX) 40 MG tablet Take 1 tablet (40 mg total) by mouth 2 (two) times daily. 60 tablet 2   torsemide (DEMADEX) 20 MG tablet Take 1 tablet (20 mg total) by mouth as needed (please take 1 '20mg'$  tablet as needed for weight gain of 2 pounds overnight or 5 pounds in a week). 180 tablet 3   traMADol (ULTRAM) 50 MG tablet Take 50 mg by mouth every 6 (six) hours as needed.     TRULICITY 3 WV/3.7TG  SOPN Inject into the skin.     No current facility-administered medications for this visit.   Facility-Administered Medications Ordered in Other Visits  Medication Dose Route Frequency Provider Last Rate Last Admin   sodium chloride 0.9 % injection 10 mL  10 mL Intracatheter PRN Truitt Merle, MD       sodium chloride flush (NS) 0.9 % injection 10 mL  10 mL Intracatheter PRN Truitt Merle, MD   10 mL at 08/18/19 1717   sodium chloride flush (NS) 0.9 % injection 10 mL  10 mL Intracatheter PRN Truitt Merle, MD   10 mL at 09/17/21 1242    PHYSICAL EXAMINATION: ECOG PERFORMANCE STATUS: 3 - Symptomatic, >50% confined to bed  There were no vitals filed for this visit. Wt Readings from Last 3 Encounters:  07/03/21 120 lb (54.4 kg)  07/01/21 121 lb 12.8 oz (55.2 kg)  06/24/21 130 lb (59 kg)     GENERAL:alert, no distress and comfortable SKIN: skin color normal, no rashes or significant lesions EYES: normal, Conjunctiva are pink and non-injected, sclera clear  NEURO: alert & oriented x 3 with fluent speech  LABORATORY DATA:  I have reviewed the data as listed    Latest Ref Rng & Units 09/17/2021   12:42 PM 07/30/2021   10:03 AM 07/01/2021    9:19 AM  CBC  WBC 4.0 - 10.5 K/uL 5.4  4.9  6.5   Hemoglobin 12.0 - 15.0 g/dL 12.2  12.7   9.4   Hematocrit 36.0 - 46.0 % 35.7  39.3  30.2   Platelets 150 - 400 K/uL 143  136  217         Latest Ref Rng & Units 07/30/2021   10:03 AM 07/01/2021    9:19 AM 06/24/2021    3:32 PM  CMP  Glucose 70 - 99 mg/dL 294  172  165   BUN 8 - 23 mg/dL '17  13  14   '$ Creatinine 0.44 - 1.00 mg/dL 1.15  0.93  0.83   Sodium 135 - 145 mmol/L 140  144  142   Potassium 3.5 - 5.1 mmol/L 4.1  4.1  4.7   Chloride 98 - 111 mmol/L 106  110  106   CO2 22 - 32 mmol/L '30  29  24   '$ Calcium 8.9 - 10.3 mg/dL 9.5  9.3  9.4   Total Protein 6.5 - 8.1 g/dL 6.3  6.2    Total Bilirubin 0.3 - 1.2 mg/dL 0.7  0.6    Alkaline Phos 38 - 126 U/L 111  105    AST 15 - 41 U/L 36  23    ALT 0 - 44 U/L 13  10        RADIOGRAPHIC STUDIES: I have personally reviewed the radiological images as listed and agreed with the findings in the report. No results found.    No orders of the defined types were placed in this encounter.  All questions were answered. The patient knows to call the clinic with any problems, questions or concerns. No barriers to learning was detected. The total time spent in the appointment was 30 minutes.     Truitt Merle, MD 09/17/2021   I, Wilburn Mylar, am acting as scribe for Truitt Merle, MD.   I have reviewed the above documentation for accuracy and completeness, and I agree with the above.

## 2021-09-18 ENCOUNTER — Encounter: Payer: Self-pay | Admitting: Hematology

## 2021-09-18 LAB — FERRITIN: Ferritin: 138 ng/mL (ref 11–307)

## 2021-09-19 NOTE — Telephone Encounter (Signed)
Spoke with [name] Scheduled next available with [facility] for 10/01/21 Results have been given by oncology and they rec appt here next  Will call sooner if needed

## 2021-09-25 ENCOUNTER — Encounter: Payer: Self-pay | Admitting: Student

## 2021-09-25 ENCOUNTER — Other Ambulatory Visit: Payer: Self-pay

## 2021-09-25 ENCOUNTER — Ambulatory Visit (INDEPENDENT_AMBULATORY_CARE_PROVIDER_SITE_OTHER): Payer: Medicare Other | Admitting: Student

## 2021-09-25 ENCOUNTER — Encounter: Payer: Self-pay | Admitting: Hematology

## 2021-09-25 VITALS — BP 146/64 | HR 58 | Temp 98.2°F | Ht 63.0 in | Wt 123.2 lb

## 2021-09-25 DIAGNOSIS — I10 Essential (primary) hypertension: Secondary | ICD-10-CM

## 2021-09-25 DIAGNOSIS — E119 Type 2 diabetes mellitus without complications: Secondary | ICD-10-CM

## 2021-09-25 DIAGNOSIS — I780 Hereditary hemorrhagic telangiectasia: Secondary | ICD-10-CM | POA: Diagnosis not present

## 2021-09-25 DIAGNOSIS — L602 Onychogryphosis: Secondary | ICD-10-CM

## 2021-09-25 DIAGNOSIS — Z794 Long term (current) use of insulin: Secondary | ICD-10-CM | POA: Diagnosis not present

## 2021-09-25 DIAGNOSIS — Z87891 Personal history of nicotine dependence: Secondary | ICD-10-CM

## 2021-09-25 LAB — POCT GLYCOSYLATED HEMOGLOBIN (HGB A1C): Hemoglobin A1C: 7.3 % — AB (ref 4.0–5.6)

## 2021-09-25 LAB — GLUCOSE, CAPILLARY: Glucose-Capillary: 289 mg/dL — ABNORMAL HIGH (ref 70–99)

## 2021-09-25 MED ORDER — LANTUS SOLOSTAR 100 UNIT/ML ~~LOC~~ SOPN: 10.0000 [IU] | PEN_INJECTOR | Freq: Every day | SUBCUTANEOUS | 1 refills | Status: DC

## 2021-09-25 MED ORDER — AMLODIPINE BESYLATE 5 MG PO TABS: 5.0000 mg | ORAL_TABLET | Freq: Every day | ORAL | 3 refills | Status: DC

## 2021-09-25 MED ORDER — TRULICITY 3 MG/0.5ML ~~LOC~~ SOAJ: 3.0000 mg | SUBCUTANEOUS | 2 refills | Status: DC

## 2021-09-25 MED ORDER — INSULIN LISPRO (1 UNIT DIAL) 100 UNIT/ML (KWIKPEN): 3.0000 [IU] | PEN_INJECTOR | Freq: Three times a day (TID) | SUBCUTANEOUS | Status: DC

## 2021-09-25 NOTE — Patient Instructions (Addendum)
Thank you, [name] for allowing [place] to provide your care today. Today we discussed your blood pressure, diabetes, HHT, anemia and foot soreness today. I have made changes to your medications as below. Make sure to follow-up with your pulmonologist next week and your oncologist as scheduled.  Your A1c has improved from 7.7% to 7.3%.  I have ordered the following labs for you:   Lab Orders         Glucose, capillary         Microalbumin / Creatinine Urine Ratio         POC Hbg A1C      I will call if any are abnormal. All of your labs can be accessed through "My Chart".  I have place a referrals to podiatrist  I have ordered the following medication/changed the following medications:  Discontinue Humalog 75/25 mix Continue Lantus 10 units daily at bedtime Continue Humalog 3 units 3 times daily with meals Continue Trulicity 3 mg weekly Increase amlodipine to 5 mg daily  My Chart Access: [email or web address]  Please follow-up in 6 months  Please make sure to arrive 15 minutes prior to your next appointment. If you arrive late, you may be asked to reschedule.    We look forward to seeing you next time. Please call our clinic at [phone] if you have any questions or concerns. The best time to call is Monday-Friday from 9am-4pm, but there is someone available 24/7. If after hours or the weekend, call the main hospital number and ask for the Internal Medicine Resident On-Call. If you need medication refills, please notify your pharmacy one week in advance and they will send [place] a request.   Thank you for letting us take part in your care. Wishing you the best!  Lacinda Axon, MD 09/25/2021, 11:10 AM IM Resident, PGY-3 [facility] 41:10

## 2021-09-25 NOTE — Progress Notes (Signed)
CC: Follow up  HPI:  [name] is a 81 y.o. female with PMH as below who presents to clinic for follow up on her chronic medical problems. Please see problem based charting for evaluation, assessment and plan.  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    Review of Systems:  Constitutional: Negative for fever or fatigue Nose: Positive for occasional nosebleeds Respiratory: Negative for shortness of breath Cardiac: Negative for chest pain MSK: Negative for back pain Abdomen: Negative for abdominal pain, constipation or diarrhea Neuro: Negative for headache, dizziness or weakness  Physical Exam: General: Pleasant, elderly woman in a wheelchair. No acute distress. Cardiac: RRR. No murmurs, rubs or gallops. No LE edema Respiratory: Lungs CTAB. No wheezing or crackles. Abdominal: Soft, symmetric and non tender. Normal BS. Skin: Warm, dry and intact without rashes or lesions Extremities: Atraumatic. Radial 2+ and DP pulses 1+ and  symmetric. Thickened, overgrown and discolored toenails.  Neuro: A&O x 3. Moves all extremities. Normal sensation to gross touch.  Psych: Appropriate mood and affect.  Vitals:   09/25/21 0940 09/25/21 0949  BP: (!) 187/64 (!) 146/64  Pulse: 68 (!) 58  Temp: 98.2 F (36.8 C)   TempSrc: Oral   SpO2: 100%   Weight: 123 lb 3.2 oz (55.9 kg)   Height: '5\' 3"'$  (1.6 m)     Assessment & Plan:   HTN (hypertension), benign Patient's blood pressure initially elevated with SBP in the 180s but repeat improved to SBP in the 140s. Based information from SNF, patient only on Amlodipine 2.5 mg daily. Will increase her amlodipine and monitor BP closely. CMP at cancer center last week showed normal kidney function.  Vitals:   09/25/21 0940 09/25/21 0949  BP: (!) 187/64 (!) 146/64   Plan:  -Increase amlodipine to 5 mg daily -Repeat BMP and BP at next OV   HHT (hereditary hemorrhagic telangiectasia) ([name] Follows Dr. FeBurr Medicot the cancer center. No recent GI bleed. She does reports occasional nosebleed but these are usually self-limiting. She has not needed blood transfusion since March 2020. Last received feraheme and Avastin in June 2023. Recently saw oncology on August 9th and CBC showed hgb/hct 12.2/35.7 and ferritin of 138.   Plan: -Continue to follow up with Dr. FeBurr Medicooncologist -Continue Avastin intermittently/PRN due to high proteinuria   Type 2 diabetes mellitus without complication [facility] where she has been staying since end of last year to follow up on her diabetes. A1c has improved from 7.7% 3 months ago to 7.3% today. CBG elevated to 289. According to information from her SNF, pt is currently is on Lantus 10 units daily, Humalog 3 units three times daily with meals and Humalog 75/25 mix 12 units daily. Called medical director of SNF to verify this regimen. We discuss our plan to simplify her insulin regimen in the setting of her  improved A1c. Patient's humalog mix is likely causing wide variations in her blood sugar. Plan to discontinue this insulin and monitor closely. Foot exam completed today shows normal sensation and pulses but overgrown toenails.  Plan: -Continue Lantus 10 units daily -Continue humalog 3 units three times daily with meals -Continue Trulicity 3 mg weekly -Follow up MA/CR -Follow up with Endocrinology as needed -Repeat A1c in 3 months  Overgrown toenails Patient has a history of overgrown toenails. On exam, she has thickened, overgrown and discolored toenails. No foot pain.  -Referral to podiatry    See Encounters Tab for problem based charting.  Patient discussed with Dr. Gardiner Sleeper, MD, MPH

## 2021-09-26 ENCOUNTER — Encounter: Payer: Self-pay | Admitting: Student

## 2021-09-26 NOTE — Assessment & Plan Note (Signed)
Patient has a history of overgrown toenails. On exam, she has thickened, overgrown and discolored toenails. No foot pain.  -Referral to podiatry

## 2021-09-26 NOTE — Assessment & Plan Note (Signed)
Follows Dr. Burr Medico at the cancer center. No recent GI bleed. She does reports occasional nosebleed but these are usually self-limiting. She has not needed blood transfusion since March 2020. Last received feraheme and Avastin in June 2023. Recently saw oncology on August 9th and CBC showed hgb/hct 12.2/35.7 and ferritin of 138.   Plan: -Continue to follow up with Dr. Burr Medico, oncologist -Continue Avastin intermittently/PRN due to high proteinuria

## 2021-09-26 NOTE — Assessment & Plan Note (Addendum)
Patient presents from SNF, [facility]), where she has been staying since end of last year to follow up on her diabetes. A1c has improved from 7.7% 3 months ago to 7.3% today. CBG elevated to 289. According to information from her SNF, pt is currently is on Lantus 10 units daily, Humalog 3 units three times daily with meals and Humalog 75/25 mix 12 units daily. Called medical director of SNF to verify this regimen. We discuss our plan to simplify her insulin regimen in the setting of her improved A1c. Patient's humalog mix is likely causing wide variations in her blood sugar. Plan to discontinue this insulin and monitor closely. Foot exam completed today shows normal sensation and pulses but overgrown toenails.  Plan: -Continue Lantus 10 units daily -Continue humalog 3 units three times daily with meals -Continue Trulicity 3 mg weekly -Follow up MA/CR -Follow up with Endocrinology as needed -Repeat A1c in 3 months

## 2021-09-26 NOTE — Assessment & Plan Note (Addendum)
Patient's blood pressure initially elevated with SBP in the 180s but repeat improved to SBP in the 140s. Based information from SNF, patient only on Amlodipine 2.5 mg daily. Will increase her amlodipine and monitor BP closely. CMP at cancer center last week showed normal kidney function.  Vitals:   09/25/21 0940 09/25/21 0949  BP: (!) 187/64 (!) 146/64   Plan:  -Increase amlodipine to 5 mg daily -Repeat BMP and BP at next OV

## 2021-09-27 LAB — MICROALBUMIN / CREATININE URINE RATIO
Creatinine, Urine: 96.1 mg/dL
Microalb/Creat Ratio: 2116 mg/g creat — ABNORMAL HIGH (ref 0–29)
Microalbumin, Urine: 2033.8 ug/mL

## 2021-09-30 MED ORDER — LISINOPRIL 5 MG PO TABS: 5.0000 mg | ORAL_TABLET | Freq: Every day | ORAL | 5 refills | Status: DC

## 2021-09-30 NOTE — Progress Notes (Signed)
MA/CR results shows significant proteinuria (>2 g) which is a known adverse effect of patient's Avastin. Patient also has diabetes which is likely contributing to the proteinuria. CMP 2 weeks ago showed creatinine of 0.86 with normal electrolytes. I will initiate low-dose lisinopril which should also help with better blood pressure control. Can consider SGLT2i in the future if appropriate. Called patient's nursing home but unable to reach her provider so left message for them to call back. -Start lisinopril 5 mg daily

## 2021-09-30 NOTE — Addendum Note (Signed)
Addended byLinwood Dibbles on: 09/30/2021 07:59 PM   Modules accepted: Orders

## 2021-10-01 ENCOUNTER — Ambulatory Visit (INDEPENDENT_AMBULATORY_CARE_PROVIDER_SITE_OTHER): Payer: Medicare Other | Admitting: Internal Medicine

## 2021-10-01 ENCOUNTER — Encounter: Payer: Self-pay | Admitting: Internal Medicine

## 2021-10-01 ENCOUNTER — Telehealth: Payer: Self-pay | Admitting: Internal Medicine

## 2021-10-01 VITALS — BP 110/62 | HR 59 | Ht 63.0 in

## 2021-10-01 DIAGNOSIS — R918 Other nonspecific abnormal finding of lung field: Secondary | ICD-10-CM | POA: Diagnosis not present

## 2021-10-01 NOTE — Progress Notes (Signed)
[name]    [id number]    Apr 14, 1940  Primary Care Physician:White, Roderic Palau, MD Date of Appointment: 10/01/2021 Established Patient Visit  Chief complaint:   Chief Complaint  Patient presents with   Follow-up    Pt f/u to discuss results. Pt reports that her breathing is good most days, no oxygen use. Pt currently living in a skilled facility.     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   Interval Updates: Here for follow up after CT Chest showed a left upper lobe lung mass concerning for primary lung malignancy.  Unfortunately she is here unaccompanied again except for the employee from [facility].  Miss Bita is very sleepy today and barely able to stay awake for the appointment.  She was unable to pay attention to my review of the scans or engage in a conversation about risks, benefits, or demonstrate any understanding of what was going on in her health.  She is having some leg pain.   History from [facility] employee is that she is pretty much full assist with ADLs, incontinent and needs help even with feeding herself.   I have reviewed the patient's family social and past medical history and updated as appropriate.   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   Hematemesis 12/04/2017   History of blood transfusion "several"   History of epistaxis    HTN (hypertension), benign 03/02/2012   Hyperlipidemia    Iron deficiency anemia    chronic  infusions"   Lichen planus    Both lower extremities   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    Past Surgical History:  Procedure Laterality Date   CATARACT EXTRACTION     "I think it was just one eye"   ESOPHAGOGASTRODUODENOSCOPY  02/26/2011   Procedure: ESOPHAGOGASTRODUODENOSCOPY (EGD);  Surgeon: [name];  [facility];  Service: Endoscopy;  Laterality: N/A;   ESOPHAGOGASTRODUODENOSCOPY N/A 11/08/2012   Procedure: ESOPHAGOGASTRODUODENOSCOPY (EGD);  Surgeon: [name];  [facility];  Service: Endoscopy;  Laterality: N/A;   ESOPHAGOGASTRODUODENOSCOPY N/A 10/04/2013   Procedure: ESOPHAGOGASTRODUODENOSCOPY (EGD);  Surgeon: [name];  [facility];  Service: Endoscopy;  Laterality: N/A;  with APC on stand-by   ESOPHAGOGASTRODUODENOSCOPY N/A 07/06/2014   Procedure: ESOPHAGOGASTRODUODENOSCOPY (EGD);  Surgeon: [name];  [facility];  Service: Endoscopy;  Laterality: N/A;   ESOPHAGOGASTRODUODENOSCOPY N/A 09/05/2014   Procedure: ESOPHAGOGASTRODUODENOSCOPY (EGD);  Surgeon: JaLaurence SpatesMD;  [facility];  Service:  Endoscopy;  Laterality: N/A;  APC on standby to control bleeding   ESOPHAGOGASTRODUODENOSCOPY N/A 11/29/2014   Procedure: ESOPHAGOGASTRODUODENOSCOPY (EGD);  Surgeon: [name], MD;  Location: [facility] ENDOSCOPY;  Service: Endoscopy;  Laterality: N/A;   ESOPHAGOGASTRODUODENOSCOPY N/A 09/28/2015   Procedure: ESOPHAGOGASTRODUODENOSCOPY (EGD);  Surgeon: Clarene Essex, MD;  Location: [facility]OSCOPY;  Service: Endoscopy;  Laterality: N/A;   ESOPHAGOGASTRODUODENOSCOPY (EGD) WITH PROPOFOL N/A 12/04/2017    Procedure: ESOPHAGOGASTRODUODENOSCOPY (EGD) WITH PROPOFOL;  Surgeon: [name], MD;  Location: [place];  Service: Endoscopy;  Laterality: N/A;   ESOPHAGOGASTRODUODENOSCOPY (EGD) WITH PROPOFOL N/A 12/24/2020   Procedure: ESOPHAGOGASTRODUODENOSCOPY (EGD) WITH PROPOFOL;  Surgeon: Arta Silence, MD;  Location: [place];  Service: Endoscopy;  Laterality: N/A;   ESOPHAGOGASTRODUODENOSCOPY (EGD) WITH PROPOFOL N/A 01/19/2021   Procedure: ESOPHAGOGASTRODUODENOSCOPY (EGD) WITH PROPOFOL;  Surgeon: [name], MD;  Location: [place];  Service: Endoscopy;  Laterality: N/A;   ESOPHAGOGASTRODUODENOSCOPY (EGD) WITH PROPOFOL N/A 02/06/2021   Procedure: ESOPHAGOGASTRODUODENOSCOPY (EGD) WITH PROPOFOL;  Surgeon: Ronnette Juniper, MD;  Location: [place];  Service: Gastroenterology;  Laterality: N/A;   ESOPHAGOGASTRODUODENOSCOPY ENDOSCOPY  08/19/2006   with laser treatment   HEMOSTASIS CLIP PLACEMENT  12/24/2020   Procedure: HEMOSTASIS CLIP PLACEMENT;  Surgeon: Arta Silence, MD;  Location: [facility] END[facility]COPY;  Service: Endoscopy;;   HEMOSTASIS CLIP PLACEMENT  01/19/2021   Procedure: HEMOSTASIS CLIP PLACEMENT;  Surgeon: [name], MD;  Location: [place];  Service: Endoscopy;;   HOT HEMOSTASIS N/A 07/06/2014   Procedure: HOT HEMOSTASIS (ARGON PLASMA COAGULATION/BICAP);  Surgeon: Clarene Essex, MD;  [name];  Service: Endoscopy;  Laterality: N/A;   HOT HEMOSTASIS N/A 09/28/2015   Procedure: HOT HEMOSTASIS (ARGON PLASMA COAGULATION/BICAP);  Surgeon: Clarene Essex, MD;  Location: [facility] ENDOSCOPY;  Service: Endoscopy;  Laterality: N/A;   HOT HEMOSTASIS N/A 12/04/2017   Procedure: HOT HEMOSTASIS (ARGON PLASMA COAGULATION/BICAP);  Surgeon: [name], MD;  Location: [place];  Service: Endoscopy;  Laterality: N/A;   IR ANGIOGRAM SELECTIVE EACH ADDITIONAL VESSEL  02/05/2021   IR ANGIOGRAM VISCERAL SELECTIVE  02/05/2021   IR EMBO ART  VEN HEMORR LYMPH EXTRAV  INC GUIDE  ROADMAPPING  02/05/2021   IR US GUIDE VASC ACCESS RIGHT  02/05/2021   NASAL HEMORRHAGE CONTROL     "for bleeding"    SAVORY DILATION  02/26/2011   Procedure: SAVORY DILATION;  Surgeon: [name], MD;  [facility]: [name];  Service: Endoscopy;  Laterality: N/A;  c-arm needed   SCLEROTHERAPY  12/24/2020   Procedure: SCLEROTHERAPY;  Surgeon: Arta Silence, MD;  [facility];  Service: Endoscopy;;   SCLEROTHERAPY  01/19/2021   Procedure: Clide Deutscher;  Surgeon: [name], MD;  Location: [facility] ENDOSCOPY;  Service: Endoscopy;;   SUBMUCOSAL INJECTION  12/04/2017   Procedure: SUBMUCOSAL INJECTION;  Surgeon: [name], MD;  Location: [facility] ENDOSCOPY;  Service: Endoscopy;;    Family History  Problem Relation Age of Onset   Healthy Mother    Stroke Father    Diabetes Niece    Breast cancer Other    Malignant hyperthermia Neg Hx    Seizures Neg Hx    Lung cancer Neg Hx     Social History   Occupational History   Occupation: Retired Systems developer: RETIRED  Tobacco Use   Smoking status: Former    Packs/day: 1.00    Years: 20.00    Total pack years: 20.00    Types: Cigarettes    Quit date: 02/10/1971    Years since quitting: 50.6   Smokeless  tobacco: Never   Tobacco comments:    09/15/2012 "smoked 50-60 yr ago"  Vaping Use   Vaping Use: Never used  Substance and Sexual Activity   Alcohol use: No    Alcohol/week: 0.0 standard drinks of alcohol   Drug use: No   Sexual activity: Not on file     Physical Exam: Blood pressure 110/62, pulse (!) 59, height '5\' 3"'$  (1.6 m), SpO2 95 %.  Gen:      No acute distress, in wheel chair, unable to keep eyes open, nodding off Lungs:    No increased respiratory effort, symmetric chest wall excursion, clear to auscultation bilaterally, no wheezes or crackles CV:         Regular rate and rhythm; no murmurs, rubs, or gallops.  No pedal edema   Data Reviewed: Imaging: I have personally reviewed the CT Chest  July 2023 shows >2cm lung mass concerning for primary lung cancer in the RUL. No associated lymphadenopathy but this is a non-contrasted study.   PFTs:     Latest Ref Rng & Units 06/26/2021   10:27 AM  PFT Results  FVC-Pre L 1.01   FVC-Predicted Pre % 53   Pre FEV1/FVC % % 74   FEV1-Pre L 0.75   FEV1-Predicted Pre % 51    I have personally reviewed the patient's PFTs and no airflow limitation, suggests restriction.   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  Dementia Deconditioning   Plan/Recommendations: I was hoping Ms. Henkes would come accompanied by a family member today unfortunately she did not. I did call and was able to leave messages for Tabitha and DLangley Gaussfor a call back. I also called her husband but was unable to leave a message.   Unfortunately this lung mass is almost certainly cancer, and unfortunately Ms. Ferraris was not in any position to communicate to me her understanding of my explanation or the next steps.  I am concerned her dementia is progressing. She is not able to make medical decisions for herself in my assessment today. Given that she is a full assist with ADLs - bed bound, incontinent and needing assistance with feeding - I'm not sure she is a great candidate to undergo general anesthesia for navigational bronchoscopy. I question what kind of treatment she would be able to withstand even if we made a diagnosis of cancer based on functional status. I will confer with Dr. FBurr Medico I have left explicit instructions for her nursing home to reschedule this appointment when a family member can accompany her so that we can have this very important conversation in  person.   I spent 35 minutes on 10/01/2021 in care of this patient including face to face time and non-face to face time spent charting, review of outside records, and coordination of care.    Return to Care: No follow-ups on file.   NLenice Llamas MD Pulmonary and [place]

## 2021-10-01 NOTE — Patient Instructions (Signed)
We need to reschedule this appointment for when your family is able to come with you.  Please call our office when we can find a time for that.   You have a spot on your lung that is concerning for lung cancer. We need to discuss a plan for what to do for this. I will reach out to Dr. Burr Medico your blood doctor to see what options may be available.

## 2021-10-02 NOTE — Telephone Encounter (Signed)
[name] niece is returning phone call. Denise phone number is [phone]. May leave detailed message on voicemail.

## 2021-10-02 NOTE — Progress Notes (Signed)
Internal Medicine Clinic Attending  Case discussed with Dr. Amponsah  At the time of the visit.  We reviewed the resident's history and exam and pertinent patient test results.  I agree with the assessment, diagnosis, and plan of care documented in the resident's note.  

## 2021-10-02 NOTE — Telephone Encounter (Signed)
Returned call. No answer. Message left for call back.

## 2021-10-03 NOTE — Telephone Encounter (Signed)
Noted by triage.  

## 2021-10-03 NOTE — Telephone Encounter (Signed)
Pt's niece [name] returned call. Checked with ND to see if she was able to speak with [name] about pt and she said that she would have to call her back as she was currently in the ICU at the hospital. Stated to [place] that ND would call her back as soon as she could and she verbalized understanding.  Routing to Dr. Shearon Stalls. [name] said she should be available anytime after 1:00 to take your call.

## 2021-10-03 NOTE — Telephone Encounter (Signed)
I was able to connect with [facility] by phone. 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 The patient has no living children and so [name] is assisting with medical appointments, and care of her aging aunt and uncle. The patient does have a living husband who is also in poor health. At this point based on my assessment she would be a poor candidate to undergo general anesthesia and bronchoscopy for biopsy.  Based on functional status she would not tolerate chemotherapy or surgery.   I think while radiation could potentially mitigate the tumor it would not improve her quality of life or current symptoms of fatigue, demenetia and ADL dependence. Denise expressed agreement and understanding.   I offered [name] and the patient's husband to come into the office to discuss with the patient's husband and in the presence of Ms. Lumley, even if she doesn't understand what's going on. She will call and make an appointment.

## 2021-10-06 ENCOUNTER — Encounter: Payer: Self-pay | Admitting: Endocrinology

## 2021-10-06 ENCOUNTER — Ambulatory Visit (INDEPENDENT_AMBULATORY_CARE_PROVIDER_SITE_OTHER): Payer: Medicare Other | Admitting: Endocrinology

## 2021-10-06 VITALS — BP 140/68 | HR 78 | Ht 63.0 in

## 2021-10-06 DIAGNOSIS — E1165 Type 2 diabetes mellitus with hyperglycemia: Secondary | ICD-10-CM

## 2021-10-06 DIAGNOSIS — Z794 Long term (current) use of insulin: Secondary | ICD-10-CM

## 2021-10-06 NOTE — Patient Instructions (Addendum)
New insulin orders as of today  Change LANTUS insulin to 7 units before breakfast and 7 units at dinnertime  Increase Humalog to 5 units before every meal  If the blood sugar at mealtimes is over 250 add additional 2 units of Humalog  Please fax me results of the blood sugar readings for 14 days on  September 15th  Please make sure you send blood sugar readings with the patient on each visit

## 2021-10-06 NOTE — Progress Notes (Unsigned)
Patient ID: [name], female   DOB: 1940/04/24, 81 y.o.   MRN: [id number]           Reason for Appointment: Follow-up for Type 2 Diabetes  Referring PCP: Koleen Distance   History of Present Illness:          Date of diagnosis of type 2 diabetes mellitus: 1988       Background history:   Patient has been on insulin for a few years and she is unclear when this was started, according to Epic she was started on Levemir in 2015 Prior to that likely had been on various oral hypoglycemic agents including metformin, glipizide and Tradjenta Her level of control has been somewhat variable but not consistently poor; her A1c has ranged between 5.6 and 7.6  Recent history:   Her A1c is last 6.2 and now 7.7   INSULIN regimen is: Lantus 10 units at bedtime, Humalog mix 12 units before breakfast and lispro 3 units before each meal  Non-insulin hypoglycemic drugs prescribed: Trulicity 3 mg weekly  Current management, blood sugar patterns and problems identified:  She is now getting Lantus in the nursing home in addition to her previous insulin doses and not clear for how long and why  With this her fasting readings are fairly good but has been as low as 67 She does not think she has had hypoglycemia Again blood sugars are highly variable at different times and no consistent pattern is seen Although she has some readings well over 200 at different times  Recently blood sugars may be almost consistently higher at dinnertime Her attendant says that her husband is bringing her milkshakes from [employer] which may make her sugar go up  She is eating better overall but usually in the mornings not eating much at breakfast         Side effects from medications have been: None known     Meal times are:  Breakfast is at variable times between 8 AM-9  Dinner 7 pm             Exercise:  Unable to do any  Glucose monitoring: As below      glucometer:?    Blood Glucose readings    PRE-MEAL  Fasting Lunch Dinner Bedtime Overall  Glucose range: 67-360 76-289 99-330 139-275   Mean/median:        Previously  PRE-MEAL Fasting Lunch Dinner Bedtime Overall  Glucose range: 106-389 130-235 112-246    Mean/median:     ?   POST-MEAL PC Breakfast PC Lunch PC Dinner  Glucose range:   110-353  Mean/median:       Dietician visit, most recent: 11/2018  Weight history:  Wt Readings from Last 3 Encounters:  09/25/21 123 lb 3.2 oz (55.9 kg)  09/17/21 124 lb 12.8 oz (56.6 kg)  07/03/21 120 lb (54.4 kg)    Glycemic control:   Lab Results  Component Value Date   HGBA1C 7.3 (A) 09/25/2021   HGBA1C 7.7 (A) 07/03/2021   HGBA1C 6.2 (H) 02/05/2021   Lab Results  Component Value Date   MICROALBUR 1,289.4 (H) 03/07/2019   LDLCALC 57 01/30/2019   CREATININE 0.86 09/17/2021   Lab Results  Component Value Date   MICRALBCREAT 2,116 (H) 09/25/2021    Lab Results  Component Value Date   FRUCTOSAMINE 404 (H) 10/31/2019   FRUCTOSAMINE 256 06/26/2019    No visits with results within 1 Week(s) from this visit.  Latest known visit with results is:  Office Visit on 09/25/2021  Component Date Value Ref Range Status   Hemoglobin A1C 09/25/2021 7.3 (A)  4.0 - 5.6 % Final   Glucose-Capillary 09/25/2021 289 (H)  70 - 99 mg/dL Final   Glucose reference range applies only to samples taken after fasting for at least 8 hours.   Creatinine, Urine 09/25/2021 96.1  Not Estab. mg/dL Final   Microalbumin, Urine 09/25/2021 2,033.8  Not Estab. ug/mL Final   Comment: Results confirmed on dilution.    Microalb/Creat Ratio 09/25/2021 2,116 (H)  0 - 29 mg/g creat Final   Comment:                        Normal:                0 -  29                        Moderately increased: 30 - 300                        Severely increased:       >300     Allergies as of 10/06/2021       Reactions   Aspirin Nausea And Vomiting        Medication List        Accurate as of October 06, 2021  1:11 PM.  If you have any questions, ask your nurse or doctor.          amLODipine 5 MG tablet Commonly known as: NORVASC Take 1 tablet (5 mg total) by mouth daily.   diclofenac Sodium 1 % Gel Commonly known as: VOLTAREN Apply 2 g topically 4 (four) times daily.   gabapentin 100 MG capsule Commonly known as: NEURONTIN Take 1 capsule (100 mg total) by mouth 2 (two) times daily.   IMMUNE FORMULA PO Take 1 tablet by mouth daily.   insulin lispro 100 UNIT/ML KwikPen Commonly known as: HumaLOG KwikPen Inject 3 Units into the skin 3 (three) times daily.   lactulose 10 GM/15ML solution Commonly known as: CHRONULAC Take 15 mLs by mouth in the morning, at noon, and at bedtime.   Lantus SoloStar 100 UNIT/ML Solostar Pen Generic drug: insulin glargine Inject 10 Units into the skin at bedtime.   levETIRAcetam 500 MG tablet Commonly known as: KEPPRA Take 1 tablet (500 mg total) by mouth 2 (two) times daily.   lisinopril 5 MG tablet Commonly known as: ZESTRIL Take 1 tablet (5 mg total) by mouth daily.   memantine 10 MG tablet Commonly known as: NAMENDA TAKE 1 TABLET BY MOUTH TWICE DAILY   mirtazapine 7.5 MG tablet Commonly known as: REMERON Take 7.5 mg by mouth at bedtime.   Multi For Her 50+ Tabs Take 1 tablet by mouth daily with breakfast.   NON FORMULARY See admin instructions. Compression Stockings apply in the morning and remove at bedtime   pantoprazole 40 MG tablet Commonly known as: PROTONIX Take 1 tablet (40 mg total) by mouth 2 (two) times daily.   torsemide 20 MG tablet Commonly known as: DEMADEX Take 1 tablet (20 mg total) by mouth as needed (please take 1 '20mg'$  tablet as needed for weight gain of 2 pounds overnight or 5 pounds in a week).   traMADol 50 MG tablet Commonly known as: ULTRAM Take 50 mg by mouth every 6 (six) hours as needed.   Trulicity 3 BW/3.8LH Sopn Generic  drug: Dulaglutide Inject 3 mg into the skin once a week.        Allergies:   Allergies  Allergen Reactions   Aspirin Nausea And Vomiting    Past Medical History:  Diagnosis Date   Chronic anemia    Chronic diastolic CHF (congestive heart failure) ([place]) 10/03/2013   Chronic GI bleeding    /notes 11/29/2014   Family history of anesthesia complication    "niece has a hard time coming out" (09/15/2012)   Frequent nosebleeds    chronic   Gastric AV malformation    /notes 11/29/2014   GERD (gastroesophageal reflux disease)    Heart murmur 04/01/2017   Moderate AVSC on echo 09/2016   Hematemesis 12/04/2017   History of blood transfusion "several"   History of epistaxis    HTN (hypertension), benign 03/02/2012   Hyperlipidemia    Iron deficiency anemia    chronic infusions"   Lichen planus    Both lower extremities   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    Past Surgical History:  Procedure Laterality Date   CATARACT EXTRACTION     "I think it was just one eye"   ESOPHAGOGASTRODUODENOSCOPY  02/26/2011   Procedure: ESOPHAGOGASTRODUODENOSCOPY (EGD);  Surgeon: [name];  [facility];  Service: Endoscopy;  Laterality: N/A;   ESOPHAGOGASTRODUODENOSCOPY N/A 11/08/2012   Procedure: ESOPHAGOGASTRODUODENOSCOPY (EGD);  Surgeon: [name];  [facility];  Service: Endoscopy;  Laterality: N/A;   ESOPHAGOGASTRODUODENOSCOPY N/A 10/04/2013   Procedure: ESOPHAGOGASTRODUODENOSCOPY (EGD);  Surgeon: [name];  [facility];  Service: Endoscopy;  Laterality: N/A;  with APC on stand-by   ESOPHAGOGASTRODUODENOSCOPY N/A 07/06/2014   Procedure: ESOPHAGOGASTRODUODENOSCOPY (EGD);  Surgeon: [name];  [facility];  Service: Endoscopy;  Laterality: N/A;   ESOPHAGOGASTRODUODENOSCOPY N/A 09/05/2014   Procedure: ESOPHAGOGASTRODUODENOSCOPY (EGD);  Surgeon: JaLaurence SpatesMD;  [facility];  Service: Endoscopy;  Laterality: N/A;  APC on standby to control bleeding   ESOPHAGOGASTRODUODENOSCOPY N/A 11/29/2014   Procedure: ESOPHAGOGASTRODUODENOSCOPY (EGD);  Surgeon: [name];  [facility];  Service: Endoscopy;  Laterality: N/A;   ESOPHAGOGASTRODUODENOSCOPY N/A 09/28/2015   Procedure: ESOPHAGOGASTRODUODENOSCOPY (EGD);  Surgeon: [name];  [facility];  Service: Endoscopy;  Laterality: N/A;   ESOPHAGOGASTRODUODENOSCOPY (EGD) WITH PROPOFOL N/A 12/04/2017   Procedure: ESOPHAGOGASTRODUODENOSCOPY (EGD) WITH PROPOFOL;  Surgeon: [name];  [facility]: [facility];  Laterality: N/A;   ESOPHAGOGASTRODUODENOSCOPY (EGD) WITH PROPOFOL N/A 12/24/2020   Procedure: ESOPHAGOGASTRODUODENOSCOPY (EGD) WITH PROPOFOL;  Surgeon: OuArta SilenceMD;  [facility]oscopy;  Laterality: N/A;   ESOPHAGOGASTRODUODENOSCOPY (EGD) WITH PROPOFOL N/A 01/19/2021   Procedure: ESOPHAGOGASTRODUODENOSCOPY (EGD) WITH PROPOFOL;  Surgeon: [name];  [facility]: Endoscopy;  Laterality: N/A;   ESOPHAGOGASTRODUODENOSCOPY (EGD) WITH PROPOFOL N/A 02/06/2021   Procedure: ESOPHAGOGASTRODUODENOSCOPY (EGD) WITH PROPOFOL;  Surgeon: KaRonnette JuniperMD;  [facility] Service: Gastroenterology;  Laterality: N/A;   ESOPHAGOGASTRODUODENOSCOPY ENDOSCOPY  08/19/2006   with laser treatment   HEMOSTASIS CLIP PLACEMENT  12/24/2020   Procedure: HEMOSTASIS CLIP PLACEMENT;  Surgeon: OuPaulita Fujita  Gwyndolyn Saxon, MD;  [facility];  Service: Endoscopy;;   HEMOSTASIS CLIP PLACEMENT  01/19/2021   Procedure: HEMOSTASIS CLIP PLACEMENT;  Surgeon: [name], MD;  Location: [place];  Service: Endoscopy;;   HOT  HEMOSTASIS N/A 07/06/2014   Procedure: HOT HEMOSTASIS (ARGON PLASMA COAGULATION/BICAP);  Surgeon: Clarene Essex, MD;  [name];  Service: Endoscopy;  Laterality: N/A;   HOT HEMOSTASIS N/A 09/28/2015   Procedure: HOT HEMOSTASIS (ARGON PLASMA COAGULATION/BICAP);  Surgeon: Clarene Essex, MD;  [facility];  Service: Endoscopy;  Laterality: N/A;   HOT HEMOSTASIS N/A 12/04/2017   Procedure: HOT HEMOSTASIS (ARGON PLASMA COAGULATION/BICAP);  Surgeon: [name], MD;  Location: [place];  Service: Endoscopy;  Laterality: N/A;   IR ANGIOGRAM SELECTIVE EACH ADDITIONAL VESSEL  02/05/2021   IR ANGIOGRAM VISCERAL SELECTIVE  02/05/2021   IR EMBO ART  VEN HEMORR LYMPH EXTRAV  INC GUIDE ROADMAPPING  02/05/2021   IR US GUIDE VASC ACCESS RIGHT  02/05/2021   NASAL HEMORRHAGE CONTROL     "for bleeding"    SAVORY DILATION  02/26/2011   Procedure: SAVORY DILATION;  Surgeon: [name], MD;  [name];  Service: Endoscopy;  Laterality: N/A;  c-arm needed   SCLEROTHERAPY  12/24/2020   Procedure: SCLEROTHERAPY;  Surgeon: Arta Silence, MD;  Location: [facility] ENDOSCOPY;  Service: Endoscopy;;   SCLEROTHERAPY  01/19/2021   Procedure: Clide Deutscher;  Surgeon: [name], MD;  Location: [facility] END[facility]COPY;  Service: Endoscopy;;   SUBMUCOSAL INJECTION  12/04/2017   Procedure: SUBMUCOSAL INJECTION;  Surgeon: [name], MD;  Location: [facility] ENDOSCOPY;  Service: Endoscopy;;    Family History  Problem Relation Age of Onset   Healthy Mother    Stroke Father    Diabetes Niece    Breast cancer Other    Malignant hyperthermia Neg Hx    Seizures Neg Hx    Lung cancer Neg Hx     Social History:  reports that she quit smoking about 50 years ago. Her smoking use included cigarettes. She has a 20.00 pack-year smoking history. She has never used smokeless tobacco. She reports that she does not drink alcohol and does not use drugs.   Review of Systems   Lipid history: Not on a  statin drug    Lab Results  Component Value Date   CHOL 133 01/30/2019   HDL 41.30 01/30/2019   LDLCALC 57 01/30/2019   LDLDIRECT 148 (H) 03/05/2021   TRIG 176.0 (H) 01/30/2019   CHOLHDL 3 01/30/2019           Hypertension: Treated with amlodipine and olmesartan  BP Readings from Last 3 Encounters:  10/01/21 110/62  09/25/21 (!) 146/64  09/17/21 (!) 177/67   Renal function history: Has CKD of unclear etiology  She is on Demadex prescribed by PCP   Lab Results  Component Value Date   CREATININE 0.86 09/17/2021   CREATININE 1.15 (H) 07/30/2021   CREATININE 0.93 07/01/2021   Lab Results  Component Value Date   K 4.7 10/62/6948     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, microalbuminuria     Physical Examination:  LMP  (LMP Unknown)    ASSESSMENT:  Diabetes type 2 on insulin  See history of present illness for detailed discussion of current diabetes management, blood sugar patterns and problems identified  A1c is 7.7  She is currently on Trulicity 1.5 mg, Humalog 3 units before meals, Lantus 10 units in the morning and Humalog mix 12 units in the morning  Blood sugars  are highly variable and mostly high However she has normal readings periodically at all different times Also with adding Lantus to nursing home likely her fasting readings are better but she has readings as low as 67  Her diet is variable and sometimes not eating much especially in the morning Also some high readings are related to milkshakes   PLAN:    Instructions were written for the nursing home as following  May have [place] shake if patient not eating a  meal  Please instruct Mr. Mondesir to avoid bringing milkshakes from outside  REDUCE Lantus insulin to 7 units instead of 10  INCREASE Humalog at lunchtime to 4 units and continue 3 units before breakfast and dinner   There are no Patient Instructions on file for this visit.   Elayne Snare 10/06/2021, 1:11 PM   Note:  This office note was prepared with Dragon voice recognition system technology. Any transcriptional errors that result from this process are unintentional.

## 2021-10-15 ENCOUNTER — Inpatient Hospital Stay: Payer: Medicare Other | Attending: Hematology

## 2021-10-15 ENCOUNTER — Other Ambulatory Visit: Payer: Self-pay

## 2021-10-15 DIAGNOSIS — F0390 Unspecified dementia without behavioral disturbance: Secondary | ICD-10-CM | POA: Diagnosis not present

## 2021-10-15 DIAGNOSIS — E1122 Type 2 diabetes mellitus with diabetic chronic kidney disease: Secondary | ICD-10-CM | POA: Insufficient documentation

## 2021-10-15 DIAGNOSIS — I130 Hypertensive heart and chronic kidney disease with heart failure and stage 1 through stage 4 chronic kidney disease, or unspecified chronic kidney disease: Secondary | ICD-10-CM | POA: Insufficient documentation

## 2021-10-15 DIAGNOSIS — I5032 Chronic diastolic (congestive) heart failure: Secondary | ICD-10-CM | POA: Insufficient documentation

## 2021-10-15 DIAGNOSIS — D509 Iron deficiency anemia, unspecified: Secondary | ICD-10-CM | POA: Diagnosis not present

## 2021-10-15 DIAGNOSIS — I780 Hereditary hemorrhagic telangiectasia: Secondary | ICD-10-CM | POA: Diagnosis present

## 2021-10-15 DIAGNOSIS — D500 Iron deficiency anemia secondary to blood loss (chronic): Secondary | ICD-10-CM

## 2021-10-15 DIAGNOSIS — R911 Solitary pulmonary nodule: Secondary | ICD-10-CM | POA: Diagnosis not present

## 2021-10-15 DIAGNOSIS — N183 Chronic kidney disease, stage 3 unspecified: Secondary | ICD-10-CM | POA: Diagnosis not present

## 2021-10-15 LAB — CBC WITH DIFFERENTIAL (CANCER CENTER ONLY)
Abs Immature Granulocytes: 0.01 10*3/uL (ref 0.00–0.07)
Basophils Absolute: 0 10*3/uL (ref 0.0–0.1)
Basophils Relative: 0 %
Eosinophils Absolute: 0.5 10*3/uL (ref 0.0–0.5)
Eosinophils Relative: 9 %
HCT: 30.1 % — ABNORMAL LOW (ref 36.0–46.0)
Hemoglobin: 10.4 g/dL — ABNORMAL LOW (ref 12.0–15.0)
Immature Granulocytes: 0 %
Lymphocytes Relative: 20 %
Lymphs Abs: 1 10*3/uL (ref 0.7–4.0)
MCH: 31.9 pg (ref 26.0–34.0)
MCHC: 34.6 g/dL (ref 30.0–36.0)
MCV: 92.3 fL (ref 80.0–100.0)
Monocytes Absolute: 0.5 10*3/uL (ref 0.1–1.0)
Monocytes Relative: 9 %
Neutro Abs: 3.2 10*3/uL (ref 1.7–7.7)
Neutrophils Relative %: 62 %
Platelet Count: 134 10*3/uL — ABNORMAL LOW (ref 150–400)
RBC: 3.26 MIL/uL — ABNORMAL LOW (ref 3.87–5.11)
RDW: 15 % (ref 11.5–15.5)
WBC Count: 5.2 10*3/uL (ref 4.0–10.5)
nRBC: 0 % (ref 0.0–0.2)

## 2021-10-15 LAB — SAMPLE TO BLOOD BANK

## 2021-10-15 LAB — FERRITIN: Ferritin: 66 ng/mL (ref 11–307)

## 2021-10-20 ENCOUNTER — Ambulatory Visit (INDEPENDENT_AMBULATORY_CARE_PROVIDER_SITE_OTHER): Payer: Medicare Other | Admitting: Internal Medicine

## 2021-10-20 ENCOUNTER — Encounter: Payer: Self-pay | Admitting: Internal Medicine

## 2021-10-20 DIAGNOSIS — F01C0 Vascular dementia, severe, without behavioral disturbance, psychotic disturbance, mood disturbance, and anxiety: Secondary | ICD-10-CM

## 2021-10-20 DIAGNOSIS — R918 Other nonspecific abnormal finding of lung field: Secondary | ICD-10-CM | POA: Diagnosis not present

## 2021-10-20 NOTE — Progress Notes (Signed)
[name]    [id number]    01-24-1941  Primary Care Physician:White, Roderic Palau, MD Date of Appointment: 10/20/2021 Established Patient Visit  Chief complaint:   Chief Complaint  Patient presents with   Follow-up    Pt's niece to talk about tx plan    I connected with [place] on 10/20/2021 by telephone enabled telemedicine application and verified that I am speaking with the correct person using two identifiers. Patient is at home, Physician is in office.    I discussed the limitations of evaluation and management by telemedicine. The patient expressed understanding and agreed to proceed.   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   Interval Updates: Here for follow up for her lung mass. The patient is on the phone and her niece [name] has come in person to review the concerns about her lung mass. Patient reports having decreased appetite, weight loss, feeling fatigued.   History from [employer] employee is that she is pretty much full assist with ADLs, incontinent and needs help even with feeding herself.   I have reviewed the patient's family social and past medical history and updated as appropriate.   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    Past Surgical History:  Procedure Laterality Date   CATARACT EXTRACTION     "I think it was just one eye"   ESOPHAGOGASTRODUODENOSCOPY  02/26/2011   Procedure: ESOPHAGOGASTRODUODENOSCOPY (EGD);  Surgeon: [name];  [facility];  Service: Endoscopy;  Laterality: N/A;   ESOPHAGOGASTRODUODENOSCOPY N/A 11/08/2012   Procedure: ESOPHAGOGASTRODUODENOSCOPY (EGD);  Surgeon: [name];  [facility];  Service: Endoscopy;  Laterality: N/A;   ESOPHAGOGASTRODUODENOSCOPY N/A 10/04/2013   Procedure: ESOPHAGOGASTRODUODENOSCOPY (EGD);  Surgeon: [name];  [facility];  Service: Endoscopy;  Laterality: N/A;  with APC on stand-by   ESOPHAGOGASTRODUODENOSCOPY N/A 07/06/2014   Procedure: ESOPHAGOGASTRODUODENOSCOPY (EGD);  Surgeon: [name];  [facility];  Service: Endoscopy;  Laterality: N/A;   ESOPHAGOGASTRODUODENOSCOPY N/A 09/05/2014   Procedure: ESOPHAGOGASTRODUODENOSCOPY (EGD);  Surgeon: JaLaurence SpatesMD;  [facility];  Service: Endoscopy;  Laterality: N/A;  APC on standby to control bleeding  ESOPHAGOGASTRODUODENOSCOPY N/A 11/29/2014   Procedure: ESOPHAGOGASTRODUODENOSCOPY (EGD);  Surgeon: [name], MD;  [name] END[facility]COPY;  Service: Endoscopy;  Laterality: N/A;   ESOPHAGOGASTRODUODENOSCOPY N/A 09/28/2015   Procedure: ESOPHAGOGASTRODUODENOSCOPY (EGD);  Surgeon: Clarene Essex, MD;  Location[facility] END[facility]COPY;  Service: Endoscopy;  Laterality: N/A;   ESOPHAGOGASTRODUODENOSCOPY (EGD) WITH PROPOFOL N/A 12/04/2017   Procedure:  ESOPHAGOGASTRODUODENOSCOPY (EGD) WITH PROPOFOL;  Surgeon: [name], MD;  Location: [place];  Service: Endoscopy;  Laterality: N/A;   ESOPHAGOGASTRODUODENOSCOPY (EGD) WITH PROPOFOL N/A 12/24/2020   Procedure: ESOPHAGOGASTRODUODENOSCOPY (EGD) WITH PROPOFOL;  Surgeon: Arta Silence, MD;  Location: [place];  Service: Endoscopy;  Laterality: N/A;   ESOPHAGOGASTRODUODENOSCOPY (EGD) WITH PROPOFOL N/A 01/19/2021   Procedure: ESOPHAGOGASTRODUODENOSCOPY (EGD) WITH PROPOFOL;  Surgeon: [name], MD;  Location: [place];  Service: Endoscopy;  Laterality: N/A;   ESOPHAGOGASTRODUODENOSCOPY (EGD) WITH PROPOFOL N/A 02/06/2021   Procedure: ESOPHAGOGASTRODUODENOSCOPY (EGD) WITH PROPOFOL;  Surgeon: Ronnette Juniper, MD;  Location: [place];  Service: Gastroenterology;  Laterality: N/A;   ESOPHAGOGASTRODUODENOSCOPY ENDOSCOPY  08/19/2006   with laser treatment   HEMOSTASIS CLIP PLACEMENT  12/24/2020   Procedure: HEMOSTASIS CLIP PLACEMENT;  Surgeon: Arta Silence, MD;  Location: [facility] ENDOSCOPY;  Service: Endoscopy;;   HEMOSTASIS CLIP PLACEMENT  01/19/2021   Procedure: HEMOSTASIS CLIP PLACEMENT;  Surgeon: [name], MD;  Location: [place];  Service: Endoscopy;;   HOT HEMOSTASIS N/A 07/06/2014   Procedure: HOT HEMOSTASIS (ARGON PLASMA COAGULATION/BICAP);  Surgeon: Clarene Essex, MD;  [name];  Service: Endoscopy;  Laterality: N/A;   HOT HEMOSTASIS N/A 09/28/2015   Procedure: HOT HEMOSTASIS (ARGON PLASMA COAGULATION/BICAP);  Surgeon: Clarene Essex, MD;  Location: [facility] ENDOSCOPY;  Service: Endoscopy;  Laterality: N/A;   HOT HEMOSTASIS N/A 12/04/2017   Procedure: HOT HEMOSTASIS (ARGON PLASMA COAGULATION/BICAP);  Surgeon: [name], MD;  Location: [place];  Service: Endoscopy;  Laterality: N/A;   IR ANGIOGRAM SELECTIVE EACH ADDITIONAL VESSEL  02/05/2021   IR ANGIOGRAM VISCERAL SELECTIVE  02/05/2021   IR EMBO ART  VEN HEMORR LYMPH EXTRAV  INC GUIDE ROADMAPPING   02/05/2021   IR US GUIDE VASC ACCESS RIGHT  02/05/2021   NASAL HEMORRHAGE CONTROL     "for bleeding"    SAVORY DILATION  02/26/2011   Procedure: SAVORY DILATION;  Surgeon: [name], MD;  [name];  Service: Endoscopy;  Laterality: N/A;  c-arm needed   SCLEROTHERAPY  12/24/2020   Procedure: SCLEROTHERAPY;  Surgeon: Arta Silence, MD;  Location: [facility] ENDOSCOPY;  Service: Endoscopy;;   SCLEROTHERAPY  01/19/2021   Procedure: Clide Deutscher;  Surgeon: [name], MD;  [facility] ENDOSCOPY;  Service: Endoscopy;;   SUBMUCOSAL INJECTION  12/04/2017   Procedure: SUBMUCOSAL INJECTION;  Surgeon: [name], MD;  Location: [facility] ENDOSCOPY;  Service: Endoscopy;;    Family History  Problem Relation Age of Onset   Healthy Mother    Stroke Father    Diabetes Niece    Breast cancer Other    Malignant hyperthermia Neg Hx    Seizures Neg Hx    Lung cancer Neg Hx     Social History   Occupational History   Occupation: Retired Systems developer: RETIRED  Tobacco Use   Smoking status: Former    Packs/day: 1.00    Years: 20.00    Total pack years: 20.00    Types: Cigarettes    Quit date: 02/10/1971    Years since quitting: 50.7   Smokeless tobacco: Never   Tobacco comments:    09/15/2012 "smoked 50-60 yr  ago"  Vaping Use   Vaping Use: Never used  Substance and Sexual Activity   Alcohol use: No    Alcohol/week: 0.0 standard drinks of alcohol   Drug use: No   Sexual activity: Not on file     Physical Exam: None due to nature of visit.    Data Reviewed: Imaging: I have personally reviewed the CT Chest July 2023 shows >2cm lung mass concerning for primary lung cancer in the RUL. No associated lymphadenopathy but this is a non-contrasted study.   PFTs:     Latest Ref Rng & Units 06/26/2021   10:27 AM  PFT Results  FVC-Pre L 1.01   FVC-Predicted Pre % 53   Pre FEV1/FVC % % 74   FEV1-Pre L 0.75   FEV1-Predicted Pre % 51    I have personally  reviewed the patient's PFTs and no airflow limitation, suggests restriction.   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  Severe Dementia Deconditioning   Plan/Recommendations: I did review in detail with the patient over the phone and her niece in person the imaging studies showing her LUL mass concerning for primary lung malignancy. She is having constitutional symptoms which are likely confounded by her progressive dementia. I do not think this patient is a candidate for general anesthesia given her debility and advanced dementia. She would also not be a good candidate for systemic chemotherapy or surgery or radiation given her functional status. I suspect her symptoms will progress at some point at which I advise the niece DLangley Gaussto consider hospice services. I am happy to be available as needed for further goals of care or symptom management.   I spent 21 minutes on 10/20/2021 in care of this patient including face to face time and non-face to face time spent charting, review of outside records, and coordination of care.   Return to Care: Return if symptoms worsen or fail to improve.   NLenice Llamas MD Pulmonary and [place]

## 2021-10-23 ENCOUNTER — Inpatient Hospital Stay: Payer: Medicare Other | Admitting: Hematology

## 2021-10-23 ENCOUNTER — Inpatient Hospital Stay: Payer: Medicare Other

## 2021-11-01 ENCOUNTER — Emergency Department (HOSPITAL_COMMUNITY)
Admission: EM | Admit: 2021-11-01 | Discharge: 2021-11-01 | Disposition: A | Discharge status: Home / Self Care | Payer: Medicare Other | Attending: Emergency Medicine | Admitting: Emergency Medicine

## 2021-11-01 ENCOUNTER — Encounter (HOSPITAL_COMMUNITY): Payer: Self-pay

## 2021-11-01 ENCOUNTER — Other Ambulatory Visit: Payer: Self-pay

## 2021-11-01 DIAGNOSIS — N189 Chronic kidney disease, unspecified: Secondary | ICD-10-CM | POA: Insufficient documentation

## 2021-11-01 DIAGNOSIS — E1122 Type 2 diabetes mellitus with diabetic chronic kidney disease: Secondary | ICD-10-CM | POA: Insufficient documentation

## 2021-11-01 DIAGNOSIS — Z794 Long term (current) use of insulin: Secondary | ICD-10-CM | POA: Diagnosis not present

## 2021-11-01 DIAGNOSIS — I509 Heart failure, unspecified: Secondary | ICD-10-CM | POA: Insufficient documentation

## 2021-11-01 DIAGNOSIS — R040 Epistaxis: Secondary | ICD-10-CM | POA: Diagnosis present

## 2021-11-01 DIAGNOSIS — F0390 Unspecified dementia without behavioral disturbance: Secondary | ICD-10-CM | POA: Diagnosis not present

## 2021-11-01 LAB — CBC
HCT: 32.8 % — ABNORMAL LOW (ref 36.0–46.0)
Hemoglobin: 10.9 g/dL — ABNORMAL LOW (ref 12.0–15.0)
MCH: 32.2 pg (ref 26.0–34.0)
MCHC: 33.2 g/dL (ref 30.0–36.0)
MCV: 96.8 fL (ref 80.0–100.0)
Platelets: 162 10*3/uL (ref 150–400)
RBC: 3.39 MIL/uL — ABNORMAL LOW (ref 3.87–5.11)
RDW: 14.3 % (ref 11.5–15.5)
WBC: 6.2 10*3/uL (ref 4.0–10.5)
nRBC: 0 % (ref 0.0–0.2)

## 2021-11-01 MED ORDER — OXYMETAZOLINE HCL 0.05 % NA SOLN
1.0000 | Freq: Once | NASAL | Status: AC
  Administered 2021-11-01: 1 via NASAL
  Filled 2021-11-01: qty 30

## 2021-11-01 NOTE — ED Provider Notes (Signed)
[facility] DEPT Provider Note   CSN: [id number] Arrival date & time: 11/01/21  1020     History  Chief Complaint  Patient presents with   Epistaxis    [name] is a 81 y.o. female.  Patient with history of diabetes, dementia, anemia, CHF, CKD presents today from the [facility] with complaints of epistaxis. She states that her nose spontaneously began bleeding earlier in the morning this morning.  She states her facility tried to stop the bleeding but was unsuccessful and therefore called EMS.  According to EMS, when they arrived on scene they resolved no signs of bleeding and were told the bleeding had stopped sometime ago, however they requested she be transported anyways due to her history of anemia and concern for blood loss.  Currently, patient denies any complaints.  She is not anticoagulated.  The history is provided by the patient. No language interpreter was used.  Epistaxis      Home Medications Prior to Admission medications   Medication Sig Start Date End Date Taking? Authorizing Provider  amLODipine (NORVASC) 5 MG tablet Take 1 tablet (5 mg total) by mouth daily. 09/25/21 09/20/22  Lacinda Axon, MD  diclofenac Sodium (VOLTAREN) 1 % GEL Apply 2 g topically 4 (four) times daily. 12/26/20   Lacinda Axon, MD  gabapentin (NEURONTIN) 100 MG capsule Take 1 capsule (100 mg total) by mouth 2 (two) times daily. 02/12/21   Orvis Brill, MD  insulin lispro (HUMALOG KWIKPEN) 100 UNIT/ML KwikPen Inject 3 Units into the skin 3 (three) times daily. 09/25/21   Lacinda Axon, MD  lactulose (CHRONULAC) 10 GM/15ML solution Take 15 mLs by mouth in the morning, at noon, and at bedtime. 09/27/21   [provider]  LANTUS SOLOSTAR 100 UNIT/ML Solostar Pen Inject 10 Units into the skin at bedtime. 09/25/21   Lacinda Axon, MD  levETIRAcetam (KEPPRA) 500 MG tablet Take 1 tablet (500 mg total) by mouth 2 (two) times daily.  02/12/21   Orvis Brill, MD  lisinopril (ZESTRIL) 5 MG tablet Take 1 tablet (5 mg total) by mouth daily. 09/30/21   Lacinda Axon, MD  memantine (NAMENDA) 10 MG tablet TAKE 1 TABLET BY MOUTH TWICE DAILY 11/11/20   [name], Areeg N, DO  mirtazapine (REMERON) 7.5 MG tablet Take 7.5 mg by mouth at bedtime. 06/02/21   [provider]  Misc Natural Products (IMMUNE FORMULA PO) Take 1 tablet by mouth daily.    [provider]  Multiple Vitamins-Minerals (MULTI FOR HER 50+) TABS Take 1 tablet by mouth daily with breakfast.    [provider]  NON FORMULARY See admin instructions. Compression Stockings apply in the morning and remove at bedtime    [provider]  pantoprazole (PROTONIX) 40 MG tablet Take 1 tablet (40 mg total) by mouth 2 (two) times daily. 12/26/20   Lacinda Axon, MD  torsemide (DEMADEX) 20 MG tablet Take 1 tablet (20 mg total) by mouth as needed (please take 1 '20mg'$  tablet as needed for weight gain of 2 pounds overnight or 5 pounds in a week). 03/05/21 10/01/21  Lenna Sciara, NP  traMADol (ULTRAM) 50 MG tablet Take 50 mg by mouth every 6 (six) hours as needed. 03/06/21   [provider]  TRULICITY 3 FG/1.8EX SOPN Inject 3 mg into the skin once a week. 09/25/21   Lacinda Axon, MD      Allergies    Aspirin  Review of Systems   Review of Systems  HENT:  Positive for nosebleeds.   All other systems reviewed and are negative.   Physical Exam Updated Vital Signs BP 114/62   Pulse 60   Temp 98.2 F (36.8 C) (Oral)   Resp 18   Ht '5\' 3"'$  (1.6 m)   Wt 55 kg   LMP  (LMP Unknown)   SpO2 100%   BMI 21.48 kg/m  Physical Exam Vitals and nursing note reviewed.  Constitutional:      General: She is not in acute distress.    Appearance: Normal appearance. She is normal weight. She is not ill-appearing, toxic-appearing or diaphoretic.  HENT:     Head: Normocephalic and atraumatic.     Nose:     Comments: Trace amount  of dried blood located in the posterior left nare.  No signs of active bleeding.  Right nare clear.    Mouth/Throat:     Mouth: Mucous membranes are moist.     Comments: Oropharynx clear without signs of bleeding. Eyes:     Pupils: Pupils are equal, round, and reactive to light.  Cardiovascular:     Rate and Rhythm: Normal rate and regular rhythm.     Heart sounds: Normal heart sounds.  Pulmonary:     Effort: Pulmonary effort is normal. No respiratory distress.     Breath sounds: Normal breath sounds.  Abdominal:     General: Abdomen is flat.     Palpations: Abdomen is soft.  Musculoskeletal:        General: Normal range of motion.     Cervical back: Normal range of motion.  Skin:    General: Skin is warm and dry.  Neurological:     General: No focal deficit present.     Mental Status: She is alert. Mental status is at baseline.  Psychiatric:        Mood and Affect: Mood normal.        Behavior: Behavior normal.     ED Results / Procedures / Treatments   Labs (all labs ordered are listed, but only abnormal results are displayed) Labs Reviewed  CBC - Abnormal; Notable for the following components:      Result Value   RBC 3.39 (*)    Hemoglobin 10.9 (*)    HCT 32.8 (*)    All other components within normal limits    EKG None  Radiology No results found.  Procedures Procedures    Medications Ordered in ED Medications  oxymetazoline (AFRIN) 0.05 % nasal spray 1 spray (1 spray Each Nare Given 11/01/21 1105)    ED Course/ Medical Decision Making/ A&P                           Medical Decision Making Risk OTC drugs.   Patient presents today with complaints of epistaxis.  She is afebrile, nontoxic-appearing, and in no acute distress with reassuring vital signs.  Physical exam reveals no signs of active epistaxis.  Her hemoglobin is stable and actually improved from 2 weeks ago at 10.9.  After monitoring over 3 hours, patient has not had any subsequent epistaxis.   She is stable for discharge.  Given Afrin and instructions for use if her epistaxis resumes.  Also educated on red flag symptoms that would prompt immediate return.  Patient is understanding and amenable with plan, discharged in stable condition.  This is a shared visit with supervising physician Dr. Philip Aspen  who has independently evaluated patient & provided guidance in evaluation/management/disposition, in agreement with care     Final Clinical Impression(s) / ED Diagnoses Final diagnoses:  Epistaxis    Rx / DC Orders ED Discharge Orders     None     An After Visit Summary was printed and given to the patient.     Nestor Lewandowsky 11/01/21 1354    Fransico Meadow, MD 11/03/21 1200

## 2021-11-01 NOTE — ED Notes (Signed)
PTAR called at this time to be transported back to [facility].

## 2021-11-01 NOTE — ED Notes (Signed)
PTAR at bedside to transport pt back to [facility]. Sister at bedside given discharge info as well as the facility being called at this time. Pt is stable at this time of discharge. VSS

## 2021-11-01 NOTE — Discharge Instructions (Signed)
Discussed, your work-up in the ER today was reassuring for acute findings.  Your hemoglobin was stable.  Given that you have not had any new nosebleeds since you got here, no additional intervention is indicated.  I have given you a bottle of Afrin that you can spray into your nose if rebleeding occurs.  Follow-up with your primary care doctor in the next few days for additional health needs.  Return if development of any new or worsening symptoms.

## 2021-11-01 NOTE — ED Triage Notes (Signed)
BIBA from [facility] with Nose bleed and spitting up blood.  Ems noted no nose bleed on arrival Hx a-fib and anemia with blood and iron transfusions.  Denies BT usage.  [id number]

## 2021-11-06 ENCOUNTER — Inpatient Hospital Stay (HOSPITAL_BASED_OUTPATIENT_CLINIC_OR_DEPARTMENT_OTHER): Payer: Medicare Other | Admitting: Hematology

## 2021-11-06 ENCOUNTER — Inpatient Hospital Stay: Payer: Medicare Other

## 2021-11-06 ENCOUNTER — Encounter: Payer: Self-pay | Admitting: Hematology

## 2021-11-06 ENCOUNTER — Telehealth: Payer: Self-pay | Admitting: Hematology

## 2021-11-06 ENCOUNTER — Telehealth: Payer: Self-pay

## 2021-11-06 ENCOUNTER — Other Ambulatory Visit: Payer: Self-pay

## 2021-11-06 VITALS — BP 140/52 | HR 79 | Temp 98.2°F | Resp 16

## 2021-11-06 DIAGNOSIS — D500 Iron deficiency anemia secondary to blood loss (chronic): Secondary | ICD-10-CM

## 2021-11-06 DIAGNOSIS — I780 Hereditary hemorrhagic telangiectasia: Secondary | ICD-10-CM | POA: Diagnosis not present

## 2021-11-06 LAB — CBC WITH DIFFERENTIAL (CANCER CENTER ONLY)
Abs Immature Granulocytes: 0.01 10*3/uL (ref 0.00–0.07)
Basophils Absolute: 0 10*3/uL (ref 0.0–0.1)
Basophils Relative: 1 %
Eosinophils Absolute: 0.4 10*3/uL (ref 0.0–0.5)
Eosinophils Relative: 8 %
HCT: 28 % — ABNORMAL LOW (ref 36.0–46.0)
Hemoglobin: 9.5 g/dL — ABNORMAL LOW (ref 12.0–15.0)
Immature Granulocytes: 0 %
Lymphocytes Relative: 21 %
Lymphs Abs: 1 10*3/uL (ref 0.7–4.0)
MCH: 32 pg (ref 26.0–34.0)
MCHC: 33.9 g/dL (ref 30.0–36.0)
MCV: 94.3 fL (ref 80.0–100.0)
Monocytes Absolute: 0.4 10*3/uL (ref 0.1–1.0)
Monocytes Relative: 9 %
Neutro Abs: 2.8 10*3/uL (ref 1.7–7.7)
Neutrophils Relative %: 61 %
Platelet Count: 161 10*3/uL (ref 150–400)
RBC: 2.97 MIL/uL — ABNORMAL LOW (ref 3.87–5.11)
RDW: 14.5 % (ref 11.5–15.5)
WBC Count: 4.6 10*3/uL (ref 4.0–10.5)
nRBC: 0 % (ref 0.0–0.2)

## 2021-11-06 LAB — SAMPLE TO BLOOD BANK

## 2021-11-06 LAB — FERRITIN: Ferritin: 31 ng/mL (ref 11–307)

## 2021-11-06 NOTE — Patient Instructions (Signed)

## 2021-11-06 NOTE — Telephone Encounter (Signed)
Scheduled per 9/28 in basket, SNF has been notified

## 2021-11-06 NOTE — Telephone Encounter (Signed)
This RN was notified by infusion that the pt did not come to infusion after the pt's appt today with Dr. Burr Medico.  Asked Dr. Burr Medico about whether the pt's infusion was canceled today.  Dr. Burr Medico stated "No" the pt was supposed to get her infusion today and was instructed to go to the "[facility] Room".  Unfortunately, the pt and caregiver both left the facility and returned to the pt's nursing home which is [facility] in [place] [address] [phone]). Fax:[phone].  [facility] and spoke with both the Medical Director Lenore Cordia who asked this RN to contact the pt's Nurse at [facility].  Spoke with April who is the nurse for the [place] wing and the pt regarding pt did not stay today for her chemotherapy infusion and that the pt's Port-A-cath is still accessed due to the patient leaving unannounced.  April confirmed that the has returned to their facility.  Asked April if they have a qualified nurse that is are able to deaccess the pt's port-a-cath.  April was unsure and stated she would have to check with RN's to see if they could deaccess the pt's port-a-cath.  April stated she would call this RN back.  Informed April that Dr. Ernestina Penna office closes at [place] and to please contact our office before this time.  Awaiting April's return call. Tried calling them back several times eventually tried contacting the facilities DON.  Was placed on hold for long periods of time.  Left another message with the switchboard operation who stated that the DON does not have voicemail on her telephone.  Still awaiting a return call from them regarding pt's port-a-cath.

## 2021-11-06 NOTE — Progress Notes (Addendum)
[facility]   [phone] [phone]   Clinic Follow up Note   Patient Care Team: [name], MD as PCP - General [name], MD as PCP - Cardiology (Cardiology) Truitt Merle, MD as Consulting Physician (Hematology) Elayne Snare, MD as Consulting Physician (Endocrinology)  Date of Service:  11/06/2021  CHIEF COMPLAINT: f/u of HHT, anemia  CURRENT THERAPY:  1. Iv feraheme as needed if ferritin<30, latest on 07/30/21 2. Avastin '5mg'$ /kg, as needed for anemia, starting 05/09/18.              -given intermittently due to protenuria 3. Blood transfusion if Hg<8.0  ASSESSMENT & PLAN:  [name] is a 81 y.o. female with   1. Hereditary hemorrhagic telangiectasia (HHT), with epistaxis and frequent GI bleeding -She receives blood transfusion as needed, but has not needed since 04/2018 when she started Beva. -She is on Avastin intermittently/PRN due to high proteinuria. -she last required blood transfusion on 04/17/21. -she received beva and IV Feraheme on 07/01/21 and 07/30/21. She responded well  -Lab reviewed, hgb 9.5. Plan to proceed with beva and iv feraheme as scheduled. -we will continue to follow her labs monthly through her facility, [facility], and I will see her back in 6 months.   2. LUL lung nodule, likely malignant  -initially seen in 01/2021, felt to be pneumonia -recent chest CT 08/28/21 showed increase in size to 2.6 cm.  -she saw Dr. Shearon Stalls on 10/20/21, who does not feel she would tolerate anesthesia for biopsy or any subsequent treatment if she's found to have cancer. Her family has agreed not to do the work up and treatment.    3. 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 -she last received IV Feraheme on 07/30/21 -will give for ferritin <10   4. Dementia, Deconditioning -She takes Aricept and  memantine for dementia, and Keppra for seizures.  -she is currently living in [facility], since 01/2021     5. Proteinuria, HTN -likely secondary to avastin, but she also has diabetes  -She receives bevacizumab as needed   6. Comorbidities: DM, CHF, CKD stage III -f/u with PCP     Plan -proceed with Bevacizumab and Feraheme today -will have lab drawn every 4 weeks at [facility] -f/u in 6 months   No problem-specific Assessment & Plan notes found for this encounter.   INTERVAL HISTORY:  [name] is here for a follow up of HHT. She was last seen by me on 09/17/21. She presents to the clinic accompanied by her caregiver from her facility. She reports she is doing well. She is not able to remember much, such as her recent visit to the ED 5 days ago.   All other systems were reviewed with the patient and are negative.  MEDICAL HISTORY:  Past Medical History:  Diagnosis Date   Chronic anemia    Chronic diastolic CHF (congestive heart failure) ([place]) 10/03/2013   Chronic GI bleeding    /notes 11/29/2014   Family history of anesthesia complication    "niece has a hard time coming out" (09/15/2012)   Frequent nosebleeds    chronic   Gastric AV malformation    /notes 11/29/2014   GERD (gastroesophageal reflux disease)    Heart murmur 04/01/2017   Moderate AVSC on echo 09/2016   Hematemesis 12/04/2017   History  of blood transfusion "several"   History of epistaxis    HTN (hypertension), benign 03/02/2012   Hyperlipidemia    Iron deficiency anemia    chronic infusions"   Lichen planus    Both lower extremities   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    SURGICAL  HISTORY: Past Surgical History:  Procedure Laterality Date   CATARACT EXTRACTION     "I think it was just one eye"   ESOPHAGOGASTRODUODENOSCOPY  02/26/2011   Procedure: ESOPHAGOGASTRODUODENOSCOPY (EGD);  Surgeon: [name];  [facility];  Service: Endoscopy;  Laterality: N/A;   ESOPHAGOGASTRODUODENOSCOPY N/A 11/08/2012   Procedure: ESOPHAGOGASTRODUODENOSCOPY (EGD);  Surgeon: [name];  [facility];  Service: Endoscopy;  Laterality: N/A;   ESOPHAGOGASTRODUODENOSCOPY N/A 10/04/2013   Procedure: ESOPHAGOGASTRODUODENOSCOPY (EGD);  Surgeon: [name];  [facility];  Service: Endoscopy;  Laterality: N/A;  with APC on stand-by   ESOPHAGOGASTRODUODENOSCOPY N/A 07/06/2014   Procedure: ESOPHAGOGASTRODUODENOSCOPY (EGD);  Surgeon: [name];  [facility];  Service: Endoscopy;  Laterality: N/A;   ESOPHAGOGASTRODUODENOSCOPY N/A 09/05/2014   Procedure: ESOPHAGOGASTRODUODENOSCOPY (EGD);  Surgeon: JaLaurence SpatesMD;  [facility];  Service: Endoscopy;  Laterality: N/A;  APC on standby to control bleeding   ESOPHAGOGASTRODUODENOSCOPY N/A 11/29/2014   Procedure: ESOPHAGOGASTRODUODENOSCOPY (EGD);  Surgeon: [name];  [facility];  Service: Endoscopy;  Laterality: N/A;   ESOPHAGOGASTRODUODENOSCOPY N/A 09/28/2015   Procedure: ESOPHAGOGASTRODUODENOSCOPY (EGD);  Surgeon: [name];  [facility];  Service: Endoscopy;  Laterality: N/A;   ESOPHAGOGASTRODUODENOSCOPY (EGD) WITH PROPOFOL N/A 12/04/2017   Procedure: ESOPHAGOGASTRODUODENOSCOPY (EGD) WITH PROPOFOL;  Surgeon: [name];  [facility]: Endoscopy;  Laterality: N/A;   ESOPHAGOGASTRODUODENOSCOPY (EGD) WITH PROPOFOL N/A 12/24/2020   Procedure: ESOPHAGOGASTRODUODENOSCOPY (EGD) WITH PROPOFOL;  Surgeon: OuArta SilenceMD;  [facility]: Endoscopy;  Laterality: N/A;   ESOPHAGOGASTRODUODENOSCOPY  (EGD) WITH PROPOFOL N/A 01/19/2021   Procedure: ESOPHAGOGASTRODUODENOSCOPY (EGD) WITH PROPOFOL;  Surgeon: [name];  [facility]: Endoscopy;  Laterality: N/A;   ESOPHAGOGASTRODUODENOSCOPY (EGD) WITH PROPOFOL N/A 02/06/2021   Procedure: ESOPHAGOGASTRODUODENOSCOPY (EGD) WITH PROPOFOL;  Surgeon: KaRonnette JuniperMD;  [facility] Service: Gastroenterology;  Laterality: N/A;   ESOPHAGOGASTRODUODENOSCOPY ENDOSCOPY  08/19/2006   with laser treatment   HEMOSTASIS CLIP PLACEMENT  12/24/2020   Procedure: HEMOSTASIS CLIP PLACEMENT;  Surgeon: OuArta SilenceMD;  [facility];  Service: Endoscopy;;   HEMOSTASIS CLIP PLACEMENT  01/19/2021   Procedure: HEMOSTASIS CLIP PLACEMENT;  Surgeon: [name];  [facility]: Endoscopy;;   HOT HEMOSTASIS N/A 07/06/2014   Procedure: HOT HEMOSTASIS (ARGON PLASMA COAGULATION/BICAP);  Surgeon: [name];  [facility];  Service: Endoscopy;  Laterality: N/A;   HOT HEMOSTASIS N/A 09/28/2015   Procedure: HOT HEMOSTASIS (ARGON PLASMA COAGULATION/BICAP);  Surgeon: [name];  [facility];  Service: Endoscopy;  Laterality: N/A;   HOT HEMOSTASIS N/A 12/04/2017   Procedure: HOT HEMOSTASIS (ARGON PLASMA COAGULATION/BICAP);  Surgeon: [name];  [facility]: Endoscopy;  Laterality: N/A;   IR  ANGIOGRAM SELECTIVE EACH ADDITIONAL VESSEL  02/05/2021   IR ANGIOGRAM VISCERAL SELECTIVE  02/05/2021   IR EMBO ART  VEN HEMORR LYMPH EXTRAV  INC GUIDE ROADMAPPING  02/05/2021   IR US GUIDE VASC ACCESS RIGHT  02/05/2021   NASAL HEMORRHAGE CONTROL     "for bleeding"    SAVORY DILATION  02/26/2011   Procedure: SAVORY DILATION;  Surgeon: [name], MD;  [name];  Service: Endoscopy;  Laterality: N/A;  c-arm needed   SCLEROTHERAPY  12/24/2020   Procedure: SCLEROTHERAPY;  Surgeon: Arta Silence, MD;  [facility];  Service:  Endoscopy;;   SCLEROTHERAPY  01/19/2021   Procedure: Clide Deutscher;  Surgeon: [name], MD;  Location[facility] END[facility]COPY;  Service: Endoscopy;;   SUBMUCOSAL INJECTION  12/04/2017   Procedure: SUBMUCOSAL INJECTION;  Surgeon: [name], MD;  Location: [facility] ENDOSCOPY;  Service: Endoscopy;;    I have reviewed the social history and family history with the patient and they are unchanged from previous note.  ALLERGIES:  is allergic to aspirin.  MEDICATIONS:  Current Outpatient Medications  Medication Sig Dispense Refill   amLODipine (NORVASC) 5 MG tablet Take 1 tablet (5 mg total) by mouth daily. 90 tablet 3   diclofenac Sodium (VOLTAREN) 1 % GEL Apply 2 g topically 4 (four) times daily. 100 g 1   gabapentin (NEURONTIN) 100 MG capsule Take 1 capsule (100 mg total) by mouth 2 (two) times daily.     insulin lispro (HUMALOG KWIKPEN) 100 UNIT/ML KwikPen Inject 3 Units into the skin 3 (three) times daily.     lactulose (CHRONULAC) 10 GM/15ML solution Take 15 mLs by mouth in the morning, at noon, and at bedtime.     LANTUS SOLOSTAR 100 UNIT/ML Solostar Pen Inject 10 Units into the skin at bedtime. 15 mL 1   levETIRAcetam (KEPPRA) 500 MG tablet Take 1 tablet (500 mg total) by mouth 2 (two) times daily.     lisinopril (ZESTRIL) 5 MG tablet Take 1 tablet (5 mg total) by mouth daily. 30 tablet 5   memantine (NAMENDA) 10 MG tablet TAKE 1 TABLET BY MOUTH TWICE DAILY 60 tablet 10   mirtazapine (REMERON) 7.5 MG tablet Take 7.5 mg by mouth at bedtime.     Misc Natural Products (IMMUNE FORMULA PO) Take 1 tablet by mouth daily.     Multiple Vitamins-Minerals (MULTI FOR HER 50+) TABS Take 1 tablet by mouth daily with breakfast.     NON FORMULARY See admin instructions. Compression Stockings apply in the morning and remove at bedtime     pantoprazole (PROTONIX) 40 MG tablet Take 1 tablet (40 mg total) by mouth 2 (two) times daily. 60 tablet 2   torsemide (DEMADEX) 20 MG tablet Take 1 tablet (20 mg  total) by mouth as needed (please take 1 '20mg'$  tablet as needed for weight gain of 2 pounds overnight or 5 pounds in a week). 180 tablet 3   traMADol (ULTRAM) 50 MG tablet Take 50 mg by mouth every 6 (six) hours as needed.     TRULICITY 3 BO/1.7PZ SOPN Inject 3 mg into the skin once a week. 2 mL 2   No current facility-administered medications for this visit.   Facility-Administered Medications Ordered in Other Visits  Medication Dose Route Frequency Provider Last Rate Last Admin   sodium chloride 0.9 % injection 10 mL  10 mL Intracatheter PRN Truitt Merle, MD       sodium chloride flush (NS) 0.9 % injection 10 mL  10 mL  Intracatheter PRN Truitt Merle, MD   10 mL at 08/18/19 1717    PHYSICAL EXAMINATION: ECOG PERFORMANCE STATUS: 3 - Symptomatic, >50% confined to bed  Vitals:   11/06/21 1229  BP: (!) 140/52  Pulse: 79  Resp: 16  Temp: 98.2 F (36.8 C)  SpO2: 99%   Wt Readings from Last 3 Encounters:  11/01/21 121 lb 4.1 oz (55 kg)  09/25/21 123 lb 3.2 oz (55.9 kg)  09/17/21 124 lb 12.8 oz (56.6 kg)     GENERAL:alert, no distress and comfortable SKIN: skin color normal, no rashes or significant lesions EYES: normal, Conjunctiva are pink and non-injected, sclera clear  NEURO: alert & oriented x 3 with fluent speech  LABORATORY DATA:  I have reviewed the data as listed    Latest Ref Rng & Units 11/06/2021   12:16 PM 11/01/2021   12:08 PM 10/15/2021    1:15 PM  CBC  WBC 4.0 - 10.5 K/uL 4.6  6.2  5.2   Hemoglobin 12.0 - 15.0 g/dL 9.5  10.9  10.4   Hematocrit 36.0 - 46.0 % 28.0  32.8  30.1   Platelets 150 - 400 K/uL 161  162  134         Latest Ref Rng & Units 09/17/2021   12:42 PM 07/30/2021   10:03 AM 07/01/2021    9:19 AM  CMP  Glucose 70 - 99 mg/dL 307  294  172   BUN 8 - 23 mg/dL '14  17  13   '$ Creatinine 0.44 - 1.00 mg/dL 0.86  1.15  0.93   Sodium 135 - 145 mmol/L 139  140  144   Potassium 3.5 - 5.1 mmol/L 4.7  4.1  4.1   Chloride 98 - 111 mmol/L 107  106  110   CO2 22 -  32 mmol/L '30  30  29   '$ Calcium 8.9 - 10.3 mg/dL 8.9  9.5  9.3   Total Protein 6.5 - 8.1 g/dL 5.9  6.3  6.2   Total Bilirubin 0.3 - 1.2 mg/dL 0.7  0.7  0.6   Alkaline Phos 38 - 126 U/L 125  111  105   AST 15 - 41 U/L 26  36  23   ALT 0 - 44 U/L '16  13  10       '$ RADIOGRAPHIC STUDIES: I have personally reviewed the radiological images as listed and agreed with the findings in the report. No results found.    No orders of the defined types were placed in this encounter.  All questions were answered. The patient knows to call the clinic with any problems, questions or concerns. No barriers to learning was detected. The total time spent in the appointment was 25 minutes.     Truitt Merle, MD 11/06/2021   I, Wilburn Mylar, am acting as scribe for Truitt Merle, MD.   I have reviewed the above documentation for accuracy and completeness, and I agree with the above.

## 2021-11-07 ENCOUNTER — Telehealth: Payer: Self-pay

## 2021-11-07 ENCOUNTER — Other Ambulatory Visit: Payer: Self-pay

## 2021-11-07 DIAGNOSIS — D500 Iron deficiency anemia secondary to blood loss (chronic): Secondary | ICD-10-CM

## 2021-11-07 DIAGNOSIS — I780 Hereditary hemorrhagic telangiectasia: Secondary | ICD-10-CM

## 2021-11-07 NOTE — Telephone Encounter (Signed)
Spoke with Lorayne Bender - DON at [facility] regarding pt's port still being access.  Lorayne Bender stated they de-accessed the pt's port yesterday after this RN contacted them but unfortunately, they did not contact Dr. Ernestina Penna office to confirm that they could deaccess the port.  Notified Dr. Burr Medico, [place], and [place].

## 2021-11-07 NOTE — Progress Notes (Signed)
Faxed labs orders over to [facility] for CBC w/Diff and Ferritin to be drawn monthly for 5 months.  Fax confirmation received.

## 2021-11-10 ENCOUNTER — Other Ambulatory Visit: Payer: Self-pay | Admitting: Hematology

## 2021-11-11 ENCOUNTER — Other Ambulatory Visit: Payer: Self-pay

## 2021-11-11 ENCOUNTER — Inpatient Hospital Stay: Payer: Medicare Other | Attending: Hematology

## 2021-11-11 ENCOUNTER — Ambulatory Visit: Payer: Medicare Other | Admitting: Podiatry

## 2021-11-11 VITALS — BP 186/71 | HR 66 | Temp 98.6°F | Resp 17

## 2021-11-11 DIAGNOSIS — Z5111 Encounter for antineoplastic chemotherapy: Secondary | ICD-10-CM | POA: Diagnosis not present

## 2021-11-11 DIAGNOSIS — I780 Hereditary hemorrhagic telangiectasia: Secondary | ICD-10-CM | POA: Insufficient documentation

## 2021-11-11 DIAGNOSIS — D509 Iron deficiency anemia, unspecified: Secondary | ICD-10-CM | POA: Insufficient documentation

## 2021-11-11 MED ORDER — HEPARIN SOD (PORK) LOCK FLUSH 100 UNIT/ML IV SOLN
500.0000 [IU] | Freq: Once | INTRAVENOUS | Status: AC | PRN
  Administered 2021-11-11: 500 [IU]

## 2021-11-11 MED ORDER — SODIUM CHLORIDE 0.9 % IV SOLN
4.6000 mg/kg | Freq: Once | INTRAVENOUS | Status: AC
  Administered 2021-11-11: 300 mg via INTRAVENOUS
  Filled 2021-11-11: qty 12

## 2021-11-11 MED ORDER — SODIUM CHLORIDE 0.9% FLUSH
10.0000 mL | INTRAVENOUS | Status: DC | PRN
  Administered 2021-11-11: 10 mL

## 2021-11-11 MED ORDER — SODIUM CHLORIDE 0.9 % IV SOLN: Freq: Once | INTRAVENOUS | Status: AC

## 2021-11-11 NOTE — Patient Instructions (Signed)
[facility]  Discharge Instructions: Thank you for choosing [facility] to provide your oncology and hematology care.   If you have a lab appointment with the [facility], please go directly to the [facility] and check in at the registration area.   Wear comfortable clothing and clothing appropriate for easy access to any Portacath or PICC line.   We strive to give you quality time with your provider. You may need to reschedule your appointment if you arrive late (15 or more minutes).  Arriving late affects you and other patients whose appointments are after yours.  Also, if you miss three or more appointments without notifying the office, you may be dismissed from the clinic at the provider's discretion.      For prescription refill requests, have your pharmacy contact our office and allow 72 hours for refills to be completed.    Today you received the following chemotherapy and/or immunotherapy agents; Bevacizumab (Mvasi)      To help prevent nausea and vomiting after your treatment, we encourage you to take your nausea medication as directed.  BELOW ARE SYMPTOMS THAT SHOULD BE REPORTED IMMEDIATELY: *FEVER GREATER THAN 100.4 F (38 C) OR HIGHER *CHILLS OR SWEATING *NAUSEA AND VOMITING THAT IS NOT CONTROLLED WITH YOUR NAUSEA MEDICATION *UNUSUAL SHORTNESS OF BREATH *UNUSUAL BRUISING OR BLEEDING *URINARY PROBLEMS (pain or burning when urinating, or frequent urination) *BOWEL PROBLEMS (unusual diarrhea, constipation, pain near the anus) TENDERNESS IN MOUTH AND THROAT WITH OR WITHOUT PRESENCE OF ULCERS (sore throat, sores in mouth, or a toothache) UNUSUAL RASH, SWELLING OR PAIN  UNUSUAL VAGINAL DISCHARGE OR ITCHING   Items with * indicate a potential emergency and should be followed up as soon as possible or go to the Emergency Department if any problems should occur.  Please show the CHEMOTHERAPY ALERT CARD or IMMUNOTHERAPY ALERT CARD at  check-in to the Emergency Department and triage nurse.  Should you have questions after your visit or need to cancel or reschedule your appointment, please contact [facility]  Dept: [phone]  and follow the prompts.  Office hours are 8:00 a.m. to 4:30 p.m. Monday - Friday. Please note that voicemails left after 4:00 p.m. may not be returned until the following business day.  We are closed weekends and major holidays. You have access to a nurse at all times for urgent questions. Please call the main number to the clinic Dept: [phone] and follow the prompts.   For any non-urgent questions, you may also contact your provider using MyChart. We now offer e-Visits for anyone 39 and older to request care online for non-urgent symptoms. For details visit [email or web address].   Also download the MyChart app! Go to the app store, search "MyChart", open the app, select [facility], and log in with your MyChart username and password.  Masks are optional in the cancer centers. If you would like for your care team to wear a mask while they are taking care of you, please let them know. You may have one support [name] who is at least 81 years old accompany you for your appointments.

## 2021-11-12 ENCOUNTER — Inpatient Hospital Stay: Payer: Medicare Other

## 2021-11-12 DIAGNOSIS — D500 Iron deficiency anemia secondary to blood loss (chronic): Secondary | ICD-10-CM

## 2021-11-12 DIAGNOSIS — Z5111 Encounter for antineoplastic chemotherapy: Secondary | ICD-10-CM | POA: Diagnosis not present

## 2021-11-12 LAB — CBC WITH DIFFERENTIAL (CANCER CENTER ONLY)
Abs Immature Granulocytes: 0.02 10*3/uL (ref 0.00–0.07)
Basophils Absolute: 0 10*3/uL (ref 0.0–0.1)
Basophils Relative: 0 %
Eosinophils Absolute: 0.4 10*3/uL (ref 0.0–0.5)
Eosinophils Relative: 7 %
HCT: 27.6 % — ABNORMAL LOW (ref 36.0–46.0)
Hemoglobin: 9.1 g/dL — ABNORMAL LOW (ref 12.0–15.0)
Immature Granulocytes: 0 %
Lymphocytes Relative: 24 %
Lymphs Abs: 1.2 10*3/uL (ref 0.7–4.0)
MCH: 30.8 pg (ref 26.0–34.0)
MCHC: 33 g/dL (ref 30.0–36.0)
MCV: 93.6 fL (ref 80.0–100.0)
Monocytes Absolute: 0.4 10*3/uL (ref 0.1–1.0)
Monocytes Relative: 8 %
Neutro Abs: 3 10*3/uL (ref 1.7–7.7)
Neutrophils Relative %: 61 %
Platelet Count: 184 10*3/uL (ref 150–400)
RBC: 2.95 MIL/uL — ABNORMAL LOW (ref 3.87–5.11)
RDW: 14 % (ref 11.5–15.5)
WBC Count: 5 10*3/uL (ref 4.0–10.5)
nRBC: 0 % (ref 0.0–0.2)

## 2021-11-12 LAB — SAMPLE TO BLOOD BANK

## 2021-11-12 LAB — FERRITIN: Ferritin: 20 ng/mL (ref 11–307)

## 2021-11-14 ENCOUNTER — Telehealth: Payer: Self-pay | Admitting: Hematology

## 2021-11-14 NOTE — Telephone Encounter (Signed)
Scheduled follow-up appointment per 9/28 los. Rosen[place]n aware.

## 2021-11-18 ENCOUNTER — Encounter: Payer: Self-pay | Admitting: Podiatry

## 2021-11-18 ENCOUNTER — Ambulatory Visit (INDEPENDENT_AMBULATORY_CARE_PROVIDER_SITE_OTHER): Payer: Medicare Other | Admitting: Podiatry

## 2021-11-18 DIAGNOSIS — M79674 Pain in right toe(s): Secondary | ICD-10-CM | POA: Diagnosis not present

## 2021-11-18 DIAGNOSIS — M79675 Pain in left toe(s): Secondary | ICD-10-CM | POA: Diagnosis not present

## 2021-11-18 DIAGNOSIS — B351 Tinea unguium: Secondary | ICD-10-CM

## 2021-11-18 DIAGNOSIS — E1151 Type 2 diabetes mellitus with diabetic peripheral angiopathy without gangrene: Secondary | ICD-10-CM

## 2021-11-18 NOTE — Progress Notes (Signed)
This patient returns to my office for at risk foot care.  This patient requires this care by a professional since this patient will be at risk due to having diabetes and CKD.  This patient is unable to cut nails herself since the patient cannot reach her nails.These nails are painful walking and wearing shoes.  This patient presents for at risk foot care today.  General Appearance  Alert, conversant and in no acute stress.  Vascular  Dorsalis pedis and posterior tibial  pulses are absent bilaterally.  Capillary return is within normal limits  bilaterally. Cold feet.  bilaterally.  Neurologic  Senn-Weinstein monofilament wire test within normal limits  bilaterally. Muscle power within normal limits bilaterally.  Nails Thick disfigured discolored nails with subungual debris  from hallux to fifth toes bilaterally. No evidence of bacterial infection or drainage bilaterally.  Orthopedic  No limitations of motion  feet .  No crepitus or effusions noted.  HAV  B/L with hammer toes.  Skin  normotropic skin with no porokeratosis noted bilaterally.  No signs of infections or ulcers noted.     Onychomycosis  Pain in right toes  Pain in left toes  Consent was obtained for treatment procedures.   Mechanical debridement of nails 1-5  bilaterally performed with a nail nipper.  Filed with dremel without incident.    Return office visit   prn                  Told patient to return for periodic foot care and evaluation due to potential at risk complications.   Gardiner Barefoot DPM

## 2021-11-20 ENCOUNTER — Inpatient Hospital Stay (HOSPITAL_COMMUNITY)
Admission: EM | Admit: 2021-11-20 | Discharge: 2021-11-23 | DRG: 378 | Disposition: A | Discharge status: Skilled Nursing Facility | Payer: Medicare Other | Source: Skilled Nursing Facility | Attending: Family Medicine | Admitting: Family Medicine

## 2021-11-20 ENCOUNTER — Other Ambulatory Visit: Payer: Self-pay

## 2021-11-20 ENCOUNTER — Encounter: Payer: Self-pay | Admitting: Hematology

## 2021-11-20 DIAGNOSIS — Z886 Allergy status to analgesic agent status: Secondary | ICD-10-CM

## 2021-11-20 DIAGNOSIS — I272 Pulmonary hypertension, unspecified: Secondary | ICD-10-CM | POA: Diagnosis present

## 2021-11-20 DIAGNOSIS — K922 Gastrointestinal hemorrhage, unspecified: Secondary | ICD-10-CM | POA: Diagnosis present

## 2021-11-20 DIAGNOSIS — R7401 Elevation of levels of liver transaminase levels: Secondary | ICD-10-CM

## 2021-11-20 DIAGNOSIS — Z794 Long term (current) use of insulin: Secondary | ICD-10-CM

## 2021-11-20 DIAGNOSIS — I780 Hereditary hemorrhagic telangiectasia: Secondary | ICD-10-CM | POA: Diagnosis present

## 2021-11-20 DIAGNOSIS — Z7985 Long-term (current) use of injectable non-insulin antidiabetic drugs: Secondary | ICD-10-CM

## 2021-11-20 DIAGNOSIS — R7989 Other specified abnormal findings of blood chemistry: Secondary | ICD-10-CM

## 2021-11-20 DIAGNOSIS — E785 Hyperlipidemia, unspecified: Secondary | ICD-10-CM | POA: Diagnosis present

## 2021-11-20 DIAGNOSIS — E119 Type 2 diabetes mellitus without complications: Secondary | ICD-10-CM

## 2021-11-20 DIAGNOSIS — K254 Chronic or unspecified gastric ulcer with hemorrhage: Secondary | ICD-10-CM | POA: Diagnosis not present

## 2021-11-20 DIAGNOSIS — N179 Acute kidney failure, unspecified: Secondary | ICD-10-CM

## 2021-11-20 DIAGNOSIS — I10 Essential (primary) hypertension: Secondary | ICD-10-CM

## 2021-11-20 DIAGNOSIS — E875 Hyperkalemia: Secondary | ICD-10-CM | POA: Diagnosis present

## 2021-11-20 DIAGNOSIS — I130 Hypertensive heart and chronic kidney disease with heart failure and stage 1 through stage 4 chronic kidney disease, or unspecified chronic kidney disease: Secondary | ICD-10-CM | POA: Diagnosis present

## 2021-11-20 DIAGNOSIS — Z823 Family history of stroke: Secondary | ICD-10-CM

## 2021-11-20 DIAGNOSIS — K219 Gastro-esophageal reflux disease without esophagitis: Secondary | ICD-10-CM | POA: Diagnosis present

## 2021-11-20 DIAGNOSIS — K920 Hematemesis: Secondary | ICD-10-CM | POA: Diagnosis not present

## 2021-11-20 DIAGNOSIS — N1832 Chronic kidney disease, stage 3b: Secondary | ICD-10-CM | POA: Diagnosis present

## 2021-11-20 DIAGNOSIS — I5032 Chronic diastolic (congestive) heart failure: Secondary | ICD-10-CM | POA: Diagnosis present

## 2021-11-20 DIAGNOSIS — D62 Acute posthemorrhagic anemia: Secondary | ICD-10-CM

## 2021-11-20 DIAGNOSIS — K449 Diaphragmatic hernia without obstruction or gangrene: Secondary | ICD-10-CM | POA: Diagnosis present

## 2021-11-20 DIAGNOSIS — Z79899 Other long term (current) drug therapy: Secondary | ICD-10-CM

## 2021-11-20 DIAGNOSIS — E1122 Type 2 diabetes mellitus with diabetic chronic kidney disease: Secondary | ICD-10-CM | POA: Diagnosis present

## 2021-11-20 DIAGNOSIS — F0390 Unspecified dementia without behavioral disturbance: Secondary | ICD-10-CM | POA: Diagnosis present

## 2021-11-20 DIAGNOSIS — Z87891 Personal history of nicotine dependence: Secondary | ICD-10-CM

## 2021-11-20 DIAGNOSIS — G40909 Epilepsy, unspecified, not intractable, without status epilepticus: Secondary | ICD-10-CM | POA: Diagnosis present

## 2021-11-20 DIAGNOSIS — Z833 Family history of diabetes mellitus: Secondary | ICD-10-CM

## 2021-11-20 NOTE — ED Triage Notes (Signed)
BIBA from [facility] for hematemesis X1, EMS states this has happened before, no abd pain or any other complaints, CBG 333

## 2021-11-20 NOTE — Telephone Encounter (Signed)
Done

## 2021-11-20 NOTE — ED Provider Notes (Signed)
[facility] DEPT Provider Note   CSN: [id number] Arrival date & time: 11/20/21  2258     History  Chief Complaint  Patient presents with   Hematemesis    [name] is a 81 y.o. female.  The history is provided by the patient and the nursing home.  She has history of hypertension, diabetes, hyperlipidemia, hereditary hemorrhagic telangiectasias, chronic kidney disease, gastric ulcers and comes in after having vomited blood once tonight.  She denies any abdominal pain and denies any ongoing nausea.  She denies any dark stools.  Per her medication list, she is not on any NSAIDs or anticoagulants.  Please note that MAR was not sent with her.   Home Medications Prior to Admission medications   Medication Sig Start Date End Date Taking? Authorizing Provider  amLODipine (NORVASC) 5 MG tablet Take 1 tablet (5 mg total) by mouth daily. 09/25/21 09/20/22  Lacinda Axon, MD  diclofenac Sodium (VOLTAREN) 1 % GEL Apply 2 g topically 4 (four) times daily. 12/26/20   Lacinda Axon, MD  gabapentin (NEURONTIN) 100 MG capsule Take 1 capsule (100 mg total) by mouth 2 (two) times daily. 02/12/21   Orvis Brill, MD  insulin lispro (HUMALOG KWIKPEN) 100 UNIT/ML KwikPen Inject 3 Units into the skin 3 (three) times daily. 09/25/21   Lacinda Axon, MD  lactulose (CHRONULAC) 10 GM/15ML solution Take 15 mLs by mouth in the morning, at noon, and at bedtime. 09/27/21   [provider]  LANTUS SOLOSTAR 100 UNIT/ML Solostar Pen Inject 10 Units into the skin at bedtime. 09/25/21   Lacinda Axon, MD  levETIRAcetam (KEPPRA) 500 MG tablet Take 1 tablet (500 mg total) by mouth 2 (two) times daily. 02/12/21   Orvis Brill, MD  lisinopril (ZESTRIL) 5 MG tablet Take 1 tablet (5 mg total) by mouth daily. 09/30/21   Lacinda Axon, MD  memantine (NAMENDA) 10 MG tablet TAKE 1 TABLET BY MOUTH TWICE DAILY 11/11/20   [name], Areeg N, DO  mirtazapine  (REMERON) 7.5 MG tablet Take 7.5 mg by mouth at bedtime. 06/02/21   [provider]  Misc Natural Products (IMMUNE FORMULA PO) Take 1 tablet by mouth daily.    [provider]  Multiple Vitamins-Minerals (MULTI FOR HER 50+) TABS Take 1 tablet by mouth daily with breakfast.    [provider]  NON FORMULARY See admin instructions. Compression Stockings apply in the morning and remove at bedtime    [provider]  pantoprazole (PROTONIX) 40 MG tablet Take 1 tablet (40 mg total) by mouth 2 (two) times daily. 12/26/20   Lacinda Axon, MD  torsemide (DEMADEX) 20 MG tablet Take 1 tablet (20 mg total) by mouth as needed (please take 1 '20mg'$  tablet as needed for weight gain of 2 pounds overnight or 5 pounds in a week). 03/05/21 10/01/21  Lenna Sciara, NP  traMADol (ULTRAM) 50 MG tablet Take 50 mg by mouth every 6 (six) hours as needed. 03/06/21   [provider]  TRULICITY 3 XL/2.4MW SOPN Inject 3 mg into the skin once a week. 09/25/21   Lacinda Axon, MD      Allergies    Aspirin    Review of Systems   Review of Systems  All other systems reviewed and are negative.   Physical Exam Updated Vital Signs BP (!) 151/58 (BP Location: Right Arm)   Pulse 72   Temp 99.4 F (37.4 C) (Oral)  Resp 17   Ht '5\' 3"'$  (1.6 m)   Wt 55 kg   LMP  (LMP Unknown)   SpO2 96%   BMI 21.48 kg/m  Physical Exam Vitals and nursing note reviewed.   81 year old female, resting comfortably and in no acute distress. Vital signs are significant for elevated blood pressure. Oxygen saturation is 96%, which is normal. Head is normocephalic and atraumatic. PERRLA, EOMI. Oropharynx is clear. Neck is nontender and supple without adenopathy or JVD. Back is nontender and there is no CVA tenderness. Lungs are clear without rales, wheezes, or rhonchi. Chest is nontender. Heart has regular rate and rhythm with 4-4/3 systolic ejection murmur heard along the left sternal  border. Abdomen is soft, flat, nontender. Extremities have no cyanosis or edema, full range of motion is present. Skin is warm and dry without rash. Neurologic: Awake and alert, cranial nerves are intact, moves all extremities equally.  ED Results / Procedures / Treatments   Labs (all labs ordered are listed, but only abnormal results are displayed) Labs Reviewed  COMPREHENSIVE METABOLIC PANEL - Abnormal; Notable for the following components:      Result Value   Potassium 5.7 (*)    Chloride 117 (*)    Glucose, Bld 270 (*)    BUN 24 (*)    Creatinine, Ser 1.12 (*)    Calcium 8.1 (*)    Total Protein 4.7 (*)    Albumin 2.3 (*)    AST 47 (*)    GFR, Estimated 49 (*)    Anion gap 4 (*)    All other components within normal limits  CBC WITH DIFFERENTIAL/PLATELET - Abnormal; Notable for the following components:   RBC 2.59 (*)    Hemoglobin 7.9 (*)    HCT 26.2 (*)    MCV 101.2 (*)    All other components within normal limits  PROTIME-INR  TYPE AND SCREEN   Procedures Procedures  Cardiac monitor shows normal sinus rhythm, per my interpretation.  Medications Ordered in ED Medications  pantoprazole (PROTONIX) 80 mg /NS 100 mL IVPB (has no administration in time range)  pantoprozole (PROTONIX) 80 mg /NS 100 mL infusion (has no administration in time range)  pantoprazole (PROTONIX) injection 40 mg (has no administration in time range)    ED Course/ Medical Decision Making/ A&P                           Medical Decision Making Amount and/or Complexity of Data Reviewed Labs: ordered.  Risk Prescription drug management.   Hematemesis.  Differential diagnosis includes, but is not limited to, bleeding gastric ulcer, bleeding duodenal ulcer, hemorrhagic gastritis, esophageal varices, Mallory-Weiss tear.  Exam is benign, I have ordered screening labs including CBC and comprehensive metabolic panel as well as type and screen.  I have reviewed her old records, and she has been  admitted multiple times for gastrointestinal hemorrhage including 02/03/2021, 01/18/2021, 12/23/2020.  She has had multiple upper endoscopies which have shown gastric ulcer as bleeding source as well as AV malformations.  She is hemodynamically stable here, if hemoglobin is stable, will hold for a repeat hemoglobin in 4 hours to make sure she is not having ongoing bleeding.  I have reviewed and interpreted her laboratory test, and my interpretation is progressive anemia with hemoglobin having dropped 1.2 g compared with 11/12/2021.  This is consistent with history of hematemesis.  Even though the patient is hemodynamically stable, her history of prior upper  GI bleeds and history of hereditary hemorrhagic telangiectasias makes it necessary to observe her as an inpatient where serial hemoglobins can be followed.  I have ordered intravenous pantoprazole loading dose and infusion.  Laboratory work-up also shows elevation of BUN in excess of elevation of creatinine consistent with upper gastrointestinal bleed.  Borderline elevated potassium is felt to be secondary to hemolysis which was noted by the laboratory.  Minimal elevation of AST is not felt to be clinically significant.  Case is discussed with Dr. Marlowe Sax of [facility], who agrees to admit the patient.  CRITICAL CARE Performed by: Delora Fuel Total critical care time: 35 minutes Critical care time was exclusive of separately billable procedures and treating other patients. Critical care was necessary to treat or prevent imminent or life-threatening deterioration. Critical care was time spent personally by me on the following activities: development of treatment plan with patient and/or surrogate as well as nursing, discussions with consultants, evaluation of patient's response to treatment, examination of patient, obtaining history from patient or surrogate, ordering and performing treatments and interventions, ordering and review of laboratory  studies, ordering and review of radiographic studies, pulse oximetry and re-evaluation of patient's condition.  Final Clinical Impression(s) / ED Diagnoses Final diagnoses:  Hematemesis with nausea  Prerenal azotemia  Elevated AST (SGOT)    Rx / DC Orders ED Discharge Orders     None         Delora Fuel, MD 16/10/96 [phone]

## 2021-11-21 ENCOUNTER — Other Ambulatory Visit: Payer: Self-pay

## 2021-11-21 ENCOUNTER — Encounter (HOSPITAL_COMMUNITY): Payer: Self-pay | Admitting: Internal Medicine

## 2021-11-21 ENCOUNTER — Observation Stay (HOSPITAL_COMMUNITY): Payer: Medicare Other

## 2021-11-21 DIAGNOSIS — N1832 Chronic kidney disease, stage 3b: Secondary | ICD-10-CM | POA: Diagnosis present

## 2021-11-21 DIAGNOSIS — Z7985 Long-term (current) use of injectable non-insulin antidiabetic drugs: Secondary | ICD-10-CM | POA: Diagnosis not present

## 2021-11-21 DIAGNOSIS — F0390 Unspecified dementia without behavioral disturbance: Secondary | ICD-10-CM | POA: Diagnosis present

## 2021-11-21 DIAGNOSIS — R011 Cardiac murmur, unspecified: Secondary | ICD-10-CM | POA: Diagnosis not present

## 2021-11-21 DIAGNOSIS — G40909 Epilepsy, unspecified, not intractable, without status epilepticus: Secondary | ICD-10-CM | POA: Diagnosis present

## 2021-11-21 DIAGNOSIS — K254 Chronic or unspecified gastric ulcer with hemorrhage: Secondary | ICD-10-CM | POA: Diagnosis present

## 2021-11-21 DIAGNOSIS — Z794 Long term (current) use of insulin: Secondary | ICD-10-CM | POA: Diagnosis not present

## 2021-11-21 DIAGNOSIS — Z87891 Personal history of nicotine dependence: Secondary | ICD-10-CM | POA: Diagnosis not present

## 2021-11-21 DIAGNOSIS — Z833 Family history of diabetes mellitus: Secondary | ICD-10-CM | POA: Diagnosis not present

## 2021-11-21 DIAGNOSIS — N179 Acute kidney failure, unspecified: Secondary | ICD-10-CM | POA: Diagnosis present

## 2021-11-21 DIAGNOSIS — K259 Gastric ulcer, unspecified as acute or chronic, without hemorrhage or perforation: Secondary | ICD-10-CM | POA: Diagnosis not present

## 2021-11-21 DIAGNOSIS — K920 Hematemesis: Secondary | ICD-10-CM | POA: Diagnosis present

## 2021-11-21 DIAGNOSIS — E785 Hyperlipidemia, unspecified: Secondary | ICD-10-CM | POA: Diagnosis present

## 2021-11-21 DIAGNOSIS — E1122 Type 2 diabetes mellitus with diabetic chronic kidney disease: Secondary | ICD-10-CM | POA: Diagnosis present

## 2021-11-21 DIAGNOSIS — I130 Hypertensive heart and chronic kidney disease with heart failure and stage 1 through stage 4 chronic kidney disease, or unspecified chronic kidney disease: Secondary | ICD-10-CM | POA: Diagnosis present

## 2021-11-21 DIAGNOSIS — D62 Acute posthemorrhagic anemia: Secondary | ICD-10-CM | POA: Diagnosis present

## 2021-11-21 DIAGNOSIS — Z823 Family history of stroke: Secondary | ICD-10-CM | POA: Diagnosis not present

## 2021-11-21 DIAGNOSIS — I5032 Chronic diastolic (congestive) heart failure: Secondary | ICD-10-CM | POA: Diagnosis present

## 2021-11-21 DIAGNOSIS — K922 Gastrointestinal hemorrhage, unspecified: Secondary | ICD-10-CM | POA: Diagnosis not present

## 2021-11-21 DIAGNOSIS — Z886 Allergy status to analgesic agent status: Secondary | ICD-10-CM | POA: Diagnosis not present

## 2021-11-21 DIAGNOSIS — K449 Diaphragmatic hernia without obstruction or gangrene: Secondary | ICD-10-CM | POA: Diagnosis present

## 2021-11-21 DIAGNOSIS — K219 Gastro-esophageal reflux disease without esophagitis: Secondary | ICD-10-CM | POA: Diagnosis present

## 2021-11-21 DIAGNOSIS — I780 Hereditary hemorrhagic telangiectasia: Secondary | ICD-10-CM | POA: Diagnosis present

## 2021-11-21 DIAGNOSIS — Z79899 Other long term (current) drug therapy: Secondary | ICD-10-CM | POA: Diagnosis not present

## 2021-11-21 DIAGNOSIS — I272 Pulmonary hypertension, unspecified: Secondary | ICD-10-CM | POA: Diagnosis present

## 2021-11-21 DIAGNOSIS — Z8711 Personal history of peptic ulcer disease: Secondary | ICD-10-CM | POA: Diagnosis not present

## 2021-11-21 DIAGNOSIS — E875 Hyperkalemia: Secondary | ICD-10-CM | POA: Diagnosis present

## 2021-11-21 LAB — COMPREHENSIVE METABOLIC PANEL
ALT: 16 U/L (ref 0–44)
ALT: 15 U/L (ref 0–44)
AST: 47 U/L — ABNORMAL HIGH (ref 15–41)
AST: 24 U/L (ref 15–41)
Albumin: 2.3 g/dL — ABNORMAL LOW (ref 3.5–5.0)
Albumin: 2.2 g/dL — ABNORMAL LOW (ref 3.5–5.0)
Alkaline Phosphatase: 112 U/L (ref 38–126)
Alkaline Phosphatase: 103 U/L (ref 38–126)
Anion gap: 4 — ABNORMAL LOW (ref 5–15)
Anion gap: 6 (ref 5–15)
BUN: 24 mg/dL — ABNORMAL HIGH (ref 8–23)
BUN: 31 mg/dL — ABNORMAL HIGH (ref 8–23)
CO2: 24 mmol/L (ref 22–32)
CO2: 23 mmol/L (ref 22–32)
Calcium: 8.1 mg/dL — ABNORMAL LOW (ref 8.9–10.3)
Calcium: 8.1 mg/dL — ABNORMAL LOW (ref 8.9–10.3)
Chloride: 117 mmol/L — ABNORMAL HIGH (ref 98–111)
Chloride: 116 mmol/L — ABNORMAL HIGH (ref 98–111)
Creatinine, Ser: 1.12 mg/dL — ABNORMAL HIGH (ref 0.44–1.00)
Creatinine, Ser: 1.2 mg/dL — ABNORMAL HIGH (ref 0.44–1.00)
GFR, Estimated: 49 mL/min — ABNORMAL LOW (ref >60)
GFR, Estimated: 45 mL/min — ABNORMAL LOW (ref >60)
Glucose, Bld: 270 mg/dL — ABNORMAL HIGH (ref 70–99)
Glucose, Bld: 300 mg/dL — ABNORMAL HIGH (ref 70–99)
Potassium: 5.7 mmol/L — ABNORMAL HIGH (ref 3.5–5.1)
Potassium: 4.8 mmol/L (ref 3.5–5.1)
Sodium: 145 mmol/L (ref 135–145)
Sodium: 145 mmol/L (ref 135–145)
Total Bilirubin: 0.7 mg/dL (ref 0.3–1.2)
Total Bilirubin: 0.7 mg/dL (ref 0.3–1.2)
Total Protein: 4.7 g/dL — ABNORMAL LOW (ref 6.5–8.1)
Total Protein: 4.4 g/dL — ABNORMAL LOW (ref 6.5–8.1)

## 2021-11-21 LAB — CBC WITH DIFFERENTIAL/PLATELET
Abs Immature Granulocytes: 0.02 10*3/uL (ref 0.00–0.07)
Basophils Absolute: 0 10*3/uL (ref 0.0–0.1)
Basophils Relative: 0 %
Eosinophils Absolute: 0.5 10*3/uL (ref 0.0–0.5)
Eosinophils Relative: 7 %
HCT: 26.2 % — ABNORMAL LOW (ref 36.0–46.0)
Hemoglobin: 7.9 g/dL — ABNORMAL LOW (ref 12.0–15.0)
Immature Granulocytes: 0 %
Lymphocytes Relative: 16 %
Lymphs Abs: 1.2 10*3/uL (ref 0.7–4.0)
MCH: 30.5 pg (ref 26.0–34.0)
MCHC: 30.2 g/dL (ref 30.0–36.0)
MCV: 101.2 fL — ABNORMAL HIGH (ref 80.0–100.0)
Monocytes Absolute: 0.6 10*3/uL (ref 0.1–1.0)
Monocytes Relative: 8 %
Neutro Abs: 5.4 10*3/uL (ref 1.7–7.7)
Neutrophils Relative %: 69 %
Platelets: 202 10*3/uL (ref 150–400)
RBC: 2.59 MIL/uL — ABNORMAL LOW (ref 3.87–5.11)
RDW: 13.5 % (ref 11.5–15.5)
WBC: 7.7 10*3/uL (ref 4.0–10.5)
nRBC: 0 % (ref 0.0–0.2)

## 2021-11-21 LAB — CBG MONITORING, ED: Glucose-Capillary: 268 mg/dL — ABNORMAL HIGH (ref 70–99)

## 2021-11-21 LAB — PREPARE RBC (CROSSMATCH)

## 2021-11-21 LAB — BRAIN NATRIURETIC PEPTIDE: B Natriuretic Peptide: 436.3 pg/mL — ABNORMAL HIGH (ref 0.0–100.0)

## 2021-11-21 LAB — GLUCOSE, CAPILLARY
Glucose-Capillary: 180 mg/dL — ABNORMAL HIGH (ref 70–99)
Glucose-Capillary: 134 mg/dL — ABNORMAL HIGH (ref 70–99)
Glucose-Capillary: 113 mg/dL — ABNORMAL HIGH (ref 70–99)
Glucose-Capillary: 105 mg/dL — ABNORMAL HIGH (ref 70–99)

## 2021-11-21 LAB — HEMOGLOBIN AND HEMATOCRIT, BLOOD
HCT: 22.3 % — ABNORMAL LOW (ref 36.0–46.0)
HCT: 21 % — ABNORMAL LOW (ref 36.0–46.0)
HCT: 22.1 % — ABNORMAL LOW (ref 36.0–46.0)
HCT: 22.9 % — ABNORMAL LOW (ref 36.0–46.0)
Hemoglobin: 7 g/dL — ABNORMAL LOW (ref 12.0–15.0)
Hemoglobin: 6.5 g/dL — CL (ref 12.0–15.0)
Hemoglobin: 6.9 g/dL — CL (ref 12.0–15.0)
Hemoglobin: 7.5 g/dL — ABNORMAL LOW (ref 12.0–15.0)

## 2021-11-21 LAB — MRSA NEXT GEN BY PCR, NASAL: MRSA by PCR Next Gen: DETECTED — AB

## 2021-11-21 LAB — PROTIME-INR
INR: 1.2 (ref 0.8–1.2)
Prothrombin Time: 14.7 seconds (ref 11.4–15.2)

## 2021-11-21 MED ORDER — SODIUM CHLORIDE 0.9% FLUSH: 10.0000 mL | INTRAVENOUS | Status: DC | PRN

## 2021-11-21 MED ORDER — PANTOPRAZOLE SODIUM 40 MG IV SOLR: 40.0000 mg | Freq: Two times a day (BID) | INTRAVENOUS | Status: DC

## 2021-11-21 MED ORDER — SALINE SPRAY 0.65 % NA SOLN
1.0000 | Freq: Once | NASAL | Status: AC
  Administered 2021-11-21: 1 via NASAL
  Filled 2021-11-21: qty 44

## 2021-11-21 MED ORDER — PANTOPRAZOLE 80MG IVPB - SIMPLE MED
80.0000 mg | Freq: Once | INTRAVENOUS | Status: AC
  Administered 2021-11-21: 80 mg via INTRAVENOUS
  Filled 2021-11-21: qty 80

## 2021-11-21 MED ORDER — ACETAMINOPHEN 650 MG RE SUPP: 650.0000 mg | Freq: Four times a day (QID) | RECTAL | Status: DC | PRN

## 2021-11-21 MED ORDER — ACETAMINOPHEN 325 MG PO TABS
650.0000 mg | ORAL_TABLET | Freq: Four times a day (QID) | ORAL | Status: DC | PRN
  Administered 2021-11-22 – 2021-11-23 (×3): 650 mg via ORAL
  Filled 2021-11-21 (×3): qty 2

## 2021-11-21 MED ORDER — CHLORHEXIDINE GLUCONATE CLOTH 2 % EX PADS
6.0000 | MEDICATED_PAD | Freq: Every day | CUTANEOUS | Status: DC
  Administered 2021-11-21 – 2021-11-22 (×3): 6 via TOPICAL

## 2021-11-21 MED ORDER — PANTOPRAZOLE INFUSION (NEW) - SIMPLE MED
8.0000 mg/h | INTRAVENOUS | Status: DC
  Administered 2021-11-21 – 2021-11-23 (×6): 8 mg/h via INTRAVENOUS
  Filled 2021-11-21: qty 80
  Filled 2021-11-21 (×2): qty 100
  Filled 2021-11-21: qty 80
  Filled 2021-11-21 (×2): qty 100
  Filled 2021-11-21 (×2): qty 80

## 2021-11-21 MED ORDER — ORAL CARE MOUTH RINSE: 15.0000 mL | OROMUCOSAL | Status: DC | PRN

## 2021-11-21 MED ORDER — SODIUM CHLORIDE 0.9 % IV SOLN: INTRAVENOUS | Status: DC

## 2021-11-21 MED ORDER — INSULIN ASPART 100 UNIT/ML IJ SOLN
0.0000 [IU] | INTRAMUSCULAR | Status: DC
  Administered 2021-11-21: 2 [IU] via SUBCUTANEOUS
  Administered 2021-11-21: 5 [IU] via SUBCUTANEOUS
  Administered 2021-11-21 – 2021-11-22 (×2): 1 [IU] via SUBCUTANEOUS
  Administered 2021-11-22: 2 [IU] via SUBCUTANEOUS
  Administered 2021-11-23 (×2): 1 [IU] via SUBCUTANEOUS
  Filled 2021-11-21: qty 0.09

## 2021-11-21 MED ORDER — MUPIROCIN 2 % EX OINT
1.0000 | TOPICAL_OINTMENT | Freq: Two times a day (BID) | CUTANEOUS | Status: DC
  Administered 2021-11-21 – 2021-11-23 (×5): 1 via NASAL
  Filled 2021-11-21: qty 22

## 2021-11-21 MED ORDER — SODIUM CHLORIDE 0.9% FLUSH
10.0000 mL | Freq: Two times a day (BID) | INTRAVENOUS | Status: DC
  Administered 2021-11-21 – 2021-11-23 (×2): 10 mL

## 2021-11-21 MED ORDER — SODIUM CHLORIDE 0.9% IV SOLUTION: Freq: Once | INTRAVENOUS | Status: AC

## 2021-11-21 NOTE — Progress Notes (Signed)
Echocardiogram not complete. Patient sleeping, when asked if she wanted to do the echo she only grunted and refused to turn off right side for exam.  [name]

## 2021-11-21 NOTE — H&P (View-Only) (Signed)
[facility] Gastroenterology Consult  Referring Provider: No ref. provider found Primary Care Physician:  [name], MD Primary Gastroenterologist: [facility], Dr. Therisa Doyne  Reason for Consultation: Hematemesis  SUBJECTIVE:   HPI: [name] is a 81 y.o. female with past medical history significant for hereditary hemorrhagic telangiectasia, gastroesophageal reflux disease, gastric arteriovenous malformation, pulmonary hypertension, type 2 diabetes mellitus, hypertension.  She is known to the [facility], underwent EGD on 01/19/2021 with Dr. Michail Sermon for coffee-ground emesis with an oozing cratered ulcer with pigmented material in the lesser curvature of the stomach (7 mm) treated with epinephrine and hemoclips x5, edema in the gastric antrum and a normal duodenum.  Follow-up EGD with Dr. Therisa Doyne on 02/06/2021 showed many nonbleeding superficial gastric ulcers with clean base in the cardia, fundus and greater curvature consistent with NG trauma, 2 endoclips in the gastric body, normal duodenum.  Patient presented to [facility] on 11/20/2021 for hematemesis.  She is a relatively poor historian.  She did state having hematemesis for few days.  She was unable to characterize color of the emesis.  She denied abdominal pain.  Denied chest pain or shortness of breath.  Noted that she is cold.  I attempted to contact patient's niece via telephone though no answer, left message to contact hospital.  Per nursing staff patient's spouse is not a reliable historian, did not call him.  On presentation hemoglobin 6.5, baseline appears to be 9-10, BUN/creatinine 31/1.20, AST/ALT 24/15, alkaline phosphatase 103, total bilirubin 0.7.  No abdominal imaging has been completed.  Per ICU nursing staff at bedside, patient has not had any further hematemesis since presentation, no bowel movement since presentation.  She is pending blood transfusion.  Past Medical History:  Diagnosis Date   Chronic anemia    Chronic  diastolic CHF (congestive heart failure) ([place]) 10/03/2013   Chronic GI bleeding    /notes 11/29/2014   Family history of anesthesia complication    "niece has a hard time coming out" (09/15/2012)   Frequent nosebleeds    chronic   Gastric AV malformation    /notes 11/29/2014   GERD (gastroesophageal reflux disease)    Heart murmur 04/01/2017   Moderate AVSC on echo 09/2016   Hematemesis 12/04/2017   History of blood transfusion "several"   History of epistaxis    HTN (hypertension), benign 03/02/2012   Hyperlipidemia    Iron deficiency anemia    chronic infusions"   Lichen planus    Both lower extremities   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   Past Surgical History:  Procedure Laterality Date   CATARACT EXTRACTION     "I think it was just one eye"   ESOPHAGOGASTRODUODENOSCOPY  02/26/2011   Procedure: ESOPHAGOGASTRODUODENOSCOPY (EGD);  Surgeon: [name];  [facility];  Service: Endoscopy;  Laterality: N/A;   ESOPHAGOGASTRODUODENOSCOPY N/A 11/08/2012   Procedure: ESOPHAGOGASTRODUODENOSCOPY (EGD);  Surgeon: [name];  [facility];  Service: Endoscopy;  Laterality: N/A;   ESOPHAGOGASTRODUODENOSCOPY N/A 10/04/2013   Procedure: ESOPHAGOGASTRODUODENOSCOPY (EGD);  Surgeon: [name];  [facility];  Service: Endoscopy;  Laterality: N/A;  with  APC on stand-by   ESOPHAGOGASTRODUODENOSCOPY N/A 07/06/2014   Procedure: ESOPHAGOGASTRODUODENOSCOPY (EGD);  Surgeon: Clarene Essex, MD;  [name];  Service: Endoscopy;  Laterality: N/A;   ESOPHAGOGASTRODUODENOSCOPY N/A 09/05/2014   Procedure: ESOPHAGOGASTRODUODENOSCOPY (EGD);   Surgeon: Laurence Spates, MD;  [name];  Service: Endoscopy;  Laterality: N/A;  APC on standby to control bleeding   ESOPHAGOGASTRODUODENOSCOPY N/A 11/29/2014   Procedure: ESOPHAGOGASTRODUODENOSCOPY (EGD);  Surgeon: [name], MD;  Location[facility];  Service: Endoscopy;  Laterality: N/A;   ESOPHAGOGASTRODUODENOSCOPY N/A 09/28/2015   Procedure: ESOPHAGOGASTRODUODENOSCOPY (EGD);  Surgeon: Clarene Essex, MD;  Location: [facility]OSCOPY;  Service: Endoscopy;  Laterality: N/A;   ESOPHAGOGASTRODUODENOSCOPY (EGD) WITH PROPOFOL N/A 12/04/2017   Procedure: ESOPHAGOGASTRODUODENOSCOPY (EGD) WITH PROPOFOL;  Surgeon: [name], MD;  Location: [place];  Service: Endoscopy;  Laterality: N/A;   ESOPHAGOGASTRODUODENOSCOPY (EGD) WITH PROPOFOL N/A 12/24/2020   Procedure: ESOPHAGOGASTRODUODENOSCOPY (EGD) WITH PROPOFOL;  Surgeon: Arta Silence, MD;  Location: [place];  Service: Endoscopy;  Laterality: N/A;   ESOPHAGOGASTRODUODENOSCOPY (EGD) WITH PROPOFOL N/A 01/19/2021   Procedure: ESOPHAGOGASTRODUODENOSCOPY (EGD) WITH PROPOFOL;  Surgeon: [name], MD;  Location: [place];  Service: Endoscopy;  Laterality: N/A;   ESOPHAGOGASTRODUODENOSCOPY (EGD) WITH PROPOFOL N/A 02/06/2021   Procedure: ESOPHAGOGASTRODUODENOSCOPY (EGD) WITH PROPOFOL;  Surgeon: Ronnette Juniper, MD;  Location: [place];  Service: Gastroenterology;  Laterality: N/A;   ESOPHAGOGASTRODUODENOSCOPY ENDOSCOPY  08/19/2006   with laser treatment   HEMOSTASIS CLIP PLACEMENT  12/24/2020   Procedure: HEMOSTASIS CLIP PLACEMENT;  Surgeon: Arta Silence, MD;  Location: [facility] ENDOSCOPY;  Service: Endoscopy;;   HEMOSTASIS CLIP PLACEMENT  01/19/2021   Procedure: HEMOSTASIS CLIP PLACEMENT;  Surgeon: [name], MD;  Location: [place];  Service: Endoscopy;;   HOT HEMOSTASIS N/A 07/06/2014   Procedure: HOT HEMOSTASIS (ARGON PLASMA COAGULATION/BICAP);  Surgeon: Clarene Essex, MD;  [name];  Service:  Endoscopy;  Laterality: N/A;   HOT HEMOSTASIS N/A 09/28/2015   Procedure: HOT HEMOSTASIS (ARGON PLASMA COAGULATION/BICAP);  Surgeon: Clarene Essex, MD;  Location: [facility] ENDOSCOPY;  Service: Endoscopy;  Laterality: N/A;   HOT HEMOSTASIS N/A 12/04/2017   Procedure: HOT HEMOSTASIS (ARGON PLASMA COAGULATION/BICAP);  Surgeon: [name], MD;  Location: [place];  Service: Endoscopy;  Laterality: N/A;   IR ANGIOGRAM SELECTIVE EACH ADDITIONAL VESSEL  02/05/2021   IR ANGIOGRAM VISCERAL SELECTIVE  02/05/2021   IR EMBO ART  VEN HEMORR LYMPH EXTRAV  INC GUIDE ROADMAPPING  02/05/2021   IR US GUIDE VASC ACCESS RIGHT  02/05/2021   NASAL HEMORRHAGE CONTROL     "for bleeding"    SAVORY DILATION  02/26/2011   Procedure: SAVORY DILATION;  Surgeon: [name], MD;  Location: [name];  Service: Endoscopy;  Laterality: N/A;  c-arm needed   SCLEROTHERAPY  12/24/2020   Procedure: SCLEROTHERAPY;  Surgeon: Arta Silence, MD;  Location[facility] ENDOSCOPY;  Service: Endoscopy;;   SCLEROTHERAPY  01/19/2021   Procedure: Clide Deutscher;  Surgeon: [name], MD;  Location: [facility] ENDOSCOPY;  Service: Endoscopy;;   SUBMUCOSAL INJECTION  12/04/2017   Procedure: SUBMUCOSAL INJECTION;  Surgeon: [name], MD;  Location: [place];  Service: Endoscopy;;   Prior to Admission medications   Medication Sig Start Date End Date Taking? Authorizing Provider  amLODipine (NORVASC) 5 MG tablet Take 1 tablet (5 mg total) by mouth daily. 09/25/21 09/20/22  Lacinda Axon, MD  diclofenac Sodium (VOLTAREN) 1 % GEL Apply 2 g topically 4 (four) times daily. 12/26/20   Lacinda Axon, MD  gabapentin (NEURONTIN) 100 MG capsule Take 1  capsule (100 mg total) by mouth 2 (two) times daily. 02/12/21   Orvis Brill, MD  insulin lispro (HUMALOG KWIKPEN) 100 UNIT/ML KwikPen Inject 3 Units into the skin 3 (three) times daily. 09/25/21   Lacinda Axon, MD  lactulose (CHRONULAC) 10 GM/15ML solution Take 15 mLs by  mouth in the morning, at noon, and at bedtime. 09/27/21   [provider]  LANTUS SOLOSTAR 100 UNIT/ML Solostar Pen Inject 10 Units into the skin at bedtime. 09/25/21   Lacinda Axon, MD  levETIRAcetam (KEPPRA) 500 MG tablet Take 1 tablet (500 mg total) by mouth 2 (two) times daily. 02/12/21   Orvis Brill, MD  lisinopril (ZESTRIL) 5 MG tablet Take 1 tablet (5 mg total) by mouth daily. 09/30/21   Lacinda Axon, MD  memantine (NAMENDA) 10 MG tablet TAKE 1 TABLET BY MOUTH TWICE DAILY 11/11/20   [name], Areeg N, DO  mirtazapine (REMERON) 7.5 MG tablet Take 7.5 mg by mouth at bedtime. 06/02/21   [provider]  Misc Natural Products (IMMUNE FORMULA PO) Take 1 tablet by mouth daily.    [provider]  Multiple Vitamins-Minerals (MULTI FOR HER 50+) TABS Take 1 tablet by mouth daily with breakfast.    [provider]  NON FORMULARY See admin instructions. Compression Stockings apply in the morning and remove at bedtime    [provider]  pantoprazole (PROTONIX) 40 MG tablet Take 1 tablet (40 mg total) by mouth 2 (two) times daily. 12/26/20   Lacinda Axon, MD  torsemide (DEMADEX) 20 MG tablet Take 1 tablet (20 mg total) by mouth as needed (please take 1 '20mg'$  tablet as needed for weight gain of 2 pounds overnight or 5 pounds in a week). 03/05/21 10/01/21  Lenna Sciara, NP  traMADol (ULTRAM) 50 MG tablet Take 50 mg by mouth every 6 (six) hours as needed. 03/06/21   [provider]  TRULICITY 3 OI/7.8MV SOPN Inject 3 mg into the skin once a week. 09/25/21   Lacinda Axon, MD   Current Facility-Administered Medications  Medication Dose Route Frequency Provider Last Rate Last Admin   0.9 %  sodium chloride infusion (Manually program via Guardrails IV Fluids)   Intravenous Once Shawna Clamp, MD       acetaminophen (TYLENOL) tablet 650 mg  650 mg Oral Q6H PRN Shela Leff, MD       Or   acetaminophen (TYLENOL) suppository 650  mg  650 mg Rectal Q6H PRN Shela Leff, MD       Chlorhexidine Gluconate Cloth 2 % PADS 6 each  6 each Topical Daily Shela Leff, MD   6 each at 11/21/21 0400   insulin aspart (novoLOG) injection 0-9 Units  0-9 Units Subcutaneous Q4H Shela Leff, MD   2 Units at 11/21/21 0841   mupirocin ointment (BACTROBAN) 2 % 1 Application  1 Application Nasal BID Shela Leff, MD       Oral care mouth rinse  15 mL Mouth Rinse PRN Shela Leff, MD       Derrill Memo ON 11/24/2021] pantoprazole (PROTONIX) injection 40 mg  40 mg Intravenous Q12H Shela Leff, MD       pantoprozole (PROTONIX) 80 mg /NS 100 mL infusion  8 mg/hr Intravenous Continuous Shela Leff, MD 10 mL/hr at 11/21/21 0518 8 mg/hr at 11/21/21 0518   sodium chloride flush (NS) 0.9 % injection 10-40 mL  10-40 mL Intracatheter Q12H Shawna Clamp, MD       sodium chloride  flush (NS) 0.9 % injection 10-40 mL  10-40 mL Intracatheter PRN Shawna Clamp, MD       Facility-Administered Medications Ordered in Other Encounters  Medication Dose Route Frequency Provider Last Rate Last Admin   sodium chloride 0.9 % injection 10 mL  10 mL Intracatheter PRN Truitt Merle, MD       sodium chloride flush (NS) 0.9 % injection 10 mL  10 mL Intracatheter PRN Truitt Merle, MD   10 mL at 08/18/19 1717   Allergies as of 11/20/2021 - Review Complete 11/20/2021  Allergen Reaction Noted   Aspirin Nausea And Vomiting 05/12/2006   Family History  Problem Relation Age of Onset   Healthy Mother    Stroke Father    Diabetes Niece    Breast cancer Other    Malignant hyperthermia Neg Hx    Seizures Neg Hx    Lung cancer Neg Hx    Social History   Socioeconomic History   Marital status: Married    Spouse name: [name]   Number of children: 0   Years of education: 9   Highest education level: Not on file  Occupational History   Occupation: Retired Systems developer: RETIRED  Tobacco Use   Smoking status: Former     Packs/day: 1.00    Years: 20.00    Total pack years: 20.00    Types: Cigarettes    Quit date: 02/10/1971    Years since quitting: 50.8   Smokeless tobacco: Never   Tobacco comments:    09/15/2012 "smoked 50-60 yr ago"  Vaping Use   Vaping Use: Never used  Substance and Sexual Activity   Alcohol use: No    Alcohol/week: 0.0 standard drinks of alcohol   Drug use: No   Sexual activity: Not on file  Other Topics Concern   Not on file  Social History Narrative   Married, lives with husband, [name].  Ambulates without assistance.     Caffeine use: none   Social Determinants of Health   Financial Resource Strain: Low Risk  (09/06/2019)   Overall Financial Resource Strain (CARDIA)    Difficulty of Paying Living Expenses: Not very hard  Food Insecurity: No Food Insecurity (09/06/2019)   Hunger Vital Sign    Worried About Running Out of Food in the Last Year: Never true    Ran Out of Food in the Last Year: Never true  Transportation Needs: No Transportation Needs (09/06/2019)   PRAPARE - Hydrologist (Medical): No    Lack of Transportation (Non-Medical): No  Physical Activity: Inactive (09/06/2019)   Exercise Vital Sign    Days of Exercise per Week: 0 days    Minutes of Exercise per Session: 0 min  Stress: Not on file  Social Connections: Not on file  Intimate Partner Violence: Not At Risk (09/06/2019)   Humiliation, Afraid, Rape, and Kick questionnaire    Fear of Current or Ex-Partner: No    Emotionally Abused: No    Physically Abused: No    Sexually Abused: No   Review of Systems:  Review of Systems  Respiratory:  Negative for shortness of breath.   Cardiovascular:  Negative for chest pain.  Gastrointestinal:  Positive for vomiting. Negative for abdominal pain and blood in stool.    OBJECTIVE:   Temp:  [98.5 F (36.9 C)-100.2 F (37.9 C)] 99.3 F (37.4 C) (10/13 0800) Pulse Rate:  [66-81] 81 (10/13 0800) Resp:  [7-24] 22 (10/13 0800) BP:  (  122-156)/(44-66) 142/51 (10/13 0800) SpO2:  [95 %-100 %] 95 % (10/13 0800) Weight:  [55 kg-58.3 kg] 58.3 kg (10/13 0420) Last BM Date :  (pta) Physical Exam Constitutional:      General: She is not in acute distress.    Appearance: She is not ill-appearing, toxic-appearing or diaphoretic.  Cardiovascular:     Rate and Rhythm: Normal rate and regular rhythm.  Pulmonary:     Effort: No respiratory distress.     Breath sounds: Normal breath sounds.     Comments: No supplemental oxygen in place at time of exam Abdominal:     General: Bowel sounds are normal. There is no distension.     Palpations: Abdomen is soft.     Tenderness: There is no abdominal tenderness. There is no guarding.  Neurological:     Mental Status: She is alert.     Labs: Recent Labs    11/20/21 2323 11/21/21 0306 11/21/21 0711  WBC 7.7  --   --   HGB 7.9* 7.0* 6.5*  HCT 26.2* 22.3* 21.0*  PLT 202  --   --    BMET Recent Labs    11/20/21 2323 11/21/21 0306  NA 145 145  K 5.7* 4.8  CL 117* 116*  CO2 24 23  GLUCOSE 270* 300*  BUN 24* 31*  CREATININE 1.12* 1.20*  CALCIUM 8.1* 8.1*   LFT Recent Labs    11/21/21 0306  PROT 4.4*  ALBUMIN 2.2*  AST 24  ALT 15  ALKPHOS 103  BILITOT 0.7   PT/INR Recent Labs    11/20/21 2323  LABPROT 14.7  INR 1.2   Diagnostic imaging: No results found.  IMPRESSION: Hematemesis, prior to admission Normocytic, acute blood loss anemia Hereditary hemorrhagic telangiectasia History gastric ulcer December 2022 requiring epinephrine and hemoclip placement  Other: pulmonary hypertension, diastolic congestive heart failure, hypertension, type 2 diabetes mellitus  PLAN: -Recommend 2 large bore Ivs -Recommend IV PPI Q12Hr -Recommend maintain NPO status for now -Blood transfusion as ordered, trend H/H, transfuse for Hgb < 7  -Monitor for recurrent hematemesis, monitor bowel movements for melena/hematochezia -Patient appears to understand her clinical  situation though not entirely, appears that consent for other hospital procedures has been obtained from her niece -I personally attempted to contact patient's niece to discuss EGD today, no answer -Will plan to resuscitate patient with IV fluids and blood, maintain NPO for now, attempt to contact patient's niece again today and determine if appropriate for EGD -GI will follow   LOS: 0 days   Danton Clap, DO [facility]  INTERVAL UPDATE: 2:46 PM  Contacted and discussed possible EGD with patient's niece, [name], via telephone.  Obtained verbal informed consent from [name] to proceed with EGD.  Verbal consent was witnessed by Andree Coss, PA.  Spoke to endoscopy team, available for EGD on 11/22/2021 at oh 7:30 AM.  Okay for diet today, will make n.p.o. at midnight for procedure tomorrow.  Danton Clap, DO [facility]

## 2021-11-21 NOTE — Anesthesia Preprocedure Evaluation (Signed)
Anesthesia Evaluation  Patient identified by MRN, date of birth, ID band Patient awake    Reviewed: Allergy & Precautions, NPO status , Patient's Chart, lab work & pertinent test results  History of Anesthesia Complications Negative for: history of anesthetic complications  Airway Mallampati: II  TM Distance: >3 FB Neck ROM: Full    Dental  (+) Dental Advisory Given, Partial Lower   Pulmonary former smoker,    Pulmonary exam normal        Cardiovascular hypertension, Pt. on medications pulmonary hypertension+ Peripheral Vascular Disease  Normal cardiovascular exam   '22 TTE - EF 65 to 70%.There is mild left ventricular hypertrophy. Grade II diastolic dysfunction (pseudonormalization). There is moderately elevated pulmonary artery systolic pressure. The estimated right ventricular systolic pressure is 77.4 mmHg. Left atrial size was severely dilated. Right atrial size was upper normal. A small to moderate pericardial effusion is present. The pericardial effusion is circumferential. There is no evidence of cardiac tamponade. Mild mitral valve regurgitation. Mild aortic valve stenosis. Aortic valve mean gradient measures 13.6 mmHg. Aortic valve Vmax measures 2.67 m/s.     Neuro/Psych Seizures -, Well Controlled,  PSYCHIATRIC DISORDERS Dementia    GI/Hepatic Neg liver ROS, GERD  Medicated and Controlled, Gastric AVM    Endo/Other  diabetes, Insulin Dependent  Renal/GU CRFRenal disease     Musculoskeletal negative musculoskeletal ROS (+)   Abdominal   Peds  Hematology  (+) Blood dyscrasia, anemia ,   Anesthesia Other Findings   Reproductive/Obstetrics                            Anesthesia Physical Anesthesia Plan  ASA: 3  Anesthesia Plan: MAC   Post-op Pain Management: Minimal or no pain anticipated   Induction:   PONV Risk Score and Plan: 2 and Propofol infusion and Treatment may vary  due to age or medical condition  Airway Management Planned: Nasal Cannula and Natural Airway  Additional Equipment: None  Intra-op Plan:   Post-operative Plan:   Informed Consent: I have reviewed the patients History and Physical, chart, labs and discussed the procedure including the risks, benefits and alternatives for the proposed anesthesia with the patient or authorized representative who has indicated his/her understanding and acceptance.       Plan Discussed with: CRNA and Anesthesiologist  Anesthesia Plan Comments:        Anesthesia Quick Evaluation

## 2021-11-21 NOTE — H&P (Signed)
History and Physical    [name] KKX:[id number] DOB: June 04, 1940 DOA: 11/20/2021  PCP: [name], MD  Patient coming from:  Nursing home  Chief Complaint: Hematemesis  HPI: [name] is a 81 y.o. 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  She presents to the ED for evaluation of hematemesis.  Hemodynamically stable.  Labs showing hemoglobin 7.9, was 9.1 on 11/12/2021, potassium 5.7 (sample hemolyzed), glucose 270 with normal bicarb, BUN 24, creatinine 1.1 (baseline 0.8-0.9). Patient was given IV Protonix 80 mg and started on continuous infusion.  Patient has dementia and is not able to give much history.  She reports an episode of hematemesis at her nursing facility.  Denies abdominal pain, melena, or hematochezia.  Denies lightheadedness/dizziness, chest pain, or shortness of breath.  Review of Systems:  Review of Systems  All other systems reviewed and are negative.   Past Medical History:  Diagnosis Date   Chronic anemia    Chronic diastolic CHF (congestive heart failure) ([place]) 10/03/2013   Chronic GI bleeding    /notes 11/29/2014   Family history of anesthesia complication    "niece has a hard time coming out" (09/15/2012)   Frequent nosebleeds    chronic   Gastric AV malformation    /notes 11/29/2014   GERD (gastroesophageal reflux disease)    Heart murmur 04/01/2017   Moderate AVSC on echo 09/2016   Hematemesis 12/04/2017   History of blood transfusion "several"   History of epistaxis    HTN (hypertension), benign 03/02/2012    Hyperlipidemia    Iron deficiency anemia    chronic infusions"   Lichen planus    Both lower extremities   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    Past Surgical History:  Procedure Laterality Date   CATARACT EXTRACTION     "I think it was just one eye"   ESOPHAGOGASTRODUODENOSCOPY  02/26/2011   Procedure: ESOPHAGOGASTRODUODENOSCOPY (EGD);  Surgeon: [name];  [facility];  Service: Endoscopy;  Laterality: N/A;   ESOPHAGOGASTRODUODENOSCOPY N/A 11/08/2012   Procedure: ESOPHAGOGASTRODUODENOSCOPY (EGD);  Surgeon: [name];  [facility];  Service: Endoscopy;  Laterality: N/A;   ESOPHAGOGASTRODUODENOSCOPY N/A 10/04/2013   Procedure: ESOPHAGOGASTRODUODENOSCOPY (EGD);  Surgeon: [name];  [facility];  Service: Endoscopy;  Laterality: N/A;  with APC on stand-by   ESOPHAGOGASTRODUODENOSCOPY N/A 07/06/2014   Procedure: ESOPHAGOGASTRODUODENOSCOPY (EGD);  Surgeon: [name];  [facility];  Service: Endoscopy;  Laterality: N/A;   ESOPHAGOGASTRODUODENOSCOPY N/A 09/05/2014   Procedure: ESOPHAGOGASTRODUODENOSCOPY (EGD);  Surgeon: JaLaurence SpatesMD;  [facility];  Service: Endoscopy;  Laterality: N/A;  APC on standby to control bleeding   ESOPHAGOGASTRODUODENOSCOPY N/A 11/29/2014   Procedure: ESOPHAGOGASTRODUODENOSCOPY (EGD);  Surgeon: [name];  [facility] Service:  Endoscopy;  Laterality: N/A;   ESOPHAGOGASTRODUODENOSCOPY N/A 09/28/2015   Procedure: ESOPHAGOGASTRODUODENOSCOPY (EGD);  Surgeon: Clarene Essex, MD;  Location: [facility] ENDOSCOPY;  Service: Endoscopy;  Laterality: N/A;    ESOPHAGOGASTRODUODENOSCOPY (EGD) WITH PROPOFOL N/A 12/04/2017   Procedure: ESOPHAGOGASTRODUODENOSCOPY (EGD) WITH PROPOFOL;  Surgeon: [name], MD;  Location: [place];  Service: Endoscopy;  Laterality: N/A;   ESOPHAGOGASTRODUODENOSCOPY (EGD) WITH PROPOFOL N/A 12/24/2020   Procedure: ESOPHAGOGASTRODUODENOSCOPY (EGD) WITH PROPOFOL;  Surgeon: Arta Silence, MD;  Location: [place];  Service: Endoscopy;  Laterality: N/A;   ESOPHAGOGASTRODUODENOSCOPY (EGD) WITH PROPOFOL N/A 01/19/2021   Procedure: ESOPHAGOGASTRODUODENOSCOPY (EGD) WITH PROPOFOL;  Surgeon: [name], MD;  Location: [place];  Service: Endoscopy;  Laterality: N/A;   ESOPHAGOGASTRODUODENOSCOPY (EGD) WITH PROPOFOL N/A 02/06/2021   Procedure: ESOPHAGOGASTRODUODENOSCOPY (EGD) WITH PROPOFOL;  Surgeon: Ronnette Juniper, MD;  Location: [place];  Service: Gastroenterology;  Laterality: N/A;   ESOPHAGOGASTRODUODENOSCOPY ENDOSCOPY  08/19/2006   with laser treatment   HEMOSTASIS CLIP PLACEMENT  12/24/2020   Procedure: HEMOSTASIS CLIP PLACEMENT;  Surgeon: Arta Silence, MD;  [facility];  Service: Endoscopy;;   HEMOSTASIS CLIP PLACEMENT  01/19/2021   Procedure: HEMOSTASIS CLIP PLACEMENT;  Surgeon: [name], MD;  Location: [place];  Service: Endoscopy;;   HOT HEMOSTASIS N/A 07/06/2014   Procedure: HOT HEMOSTASIS (ARGON PLASMA COAGULATION/BICAP);  Surgeon: Clarene Essex, MD;  [name];  Service: Endoscopy;  Laterality: N/A;   HOT HEMOSTASIS N/A 09/28/2015   Procedure: HOT HEMOSTASIS (ARGON PLASMA COAGULATION/BICAP);  Surgeon: Clarene Essex, MD;  Location: [facility] END[facility]COPY;  Service: Endoscopy;  Laterality: N/A;   HOT HEMOSTASIS N/A 12/04/2017   Procedure: HOT HEMOSTASIS (ARGON PLASMA COAGULATION/BICAP);  Surgeon: [name], MD;  Location: [place];  Service: Endoscopy;  Laterality: N/A;   IR ANGIOGRAM SELECTIVE EACH ADDITIONAL VESSEL  02/05/2021   IR ANGIOGRAM VISCERAL SELECTIVE   02/05/2021   IR EMBO ART  VEN HEMORR LYMPH EXTRAV  INC GUIDE ROADMAPPING  02/05/2021   IR US GUIDE VASC ACCESS RIGHT  02/05/2021   NASAL HEMORRHAGE CONTROL     "for bleeding"    SAVORY DILATION  02/26/2011   Procedure: SAVORY DILATION;  Surgeon: [name], MD;  [name];  Service: Endoscopy;  Laterality: N/A;  c-arm needed   SCLEROTHERAPY  12/24/2020   Procedure: SCLEROTHERAPY;  Surgeon: Arta Silence, MD;  Location: [facility] ENDOSCOPY;  Service: Endoscopy;;   SCLEROTHERAPY  01/19/2021   Procedure: Clide Deutscher;  Surgeon: [name], MD;  Location[facility]COPY;  Service: Endoscopy;;   SUBMUCOSAL INJECTION  12/04/2017   Procedure: SUBMUCOSAL INJECTION;  Surgeon: [name], MD;  Location: [place];  Service: Endoscopy;;     reports that she quit smoking about 50 years ago. Her smoking use included cigarettes. She has a 20.00 pack-year smoking history. She has never used smokeless tobacco. She reports that she does not drink alcohol and does not use drugs.  Allergies  Allergen Reactions   Aspirin Nausea And Vomiting    Family History  Problem Relation Age of Onset   Healthy Mother    Stroke Father    Diabetes Niece    Breast cancer Other    Malignant hyperthermia Neg Hx    Seizures Neg Hx    Lung cancer Neg Hx     Prior to Admission medications   Medication Sig Start Date End Date Taking? Authorizing Provider  amLODipine (NORVASC) 5 MG tablet Take 1 tablet (5 mg total) by mouth daily. 09/25/21 09/20/22  Lacinda Axon, MD  diclofenac Sodium (  VOLTAREN) 1 % GEL Apply 2 g topically 4 (four) times daily. 12/26/20   Lacinda Axon, MD  gabapentin (NEURONTIN) 100 MG capsule Take 1 capsule (100 mg total) by mouth 2 (two) times daily. 02/12/21   Orvis Brill, MD  insulin lispro (HUMALOG KWIKPEN) 100 UNIT/ML KwikPen Inject 3 Units into the skin 3 (three) times daily. 09/25/21   Lacinda Axon, MD  lactulose (CHRONULAC) 10 GM/15ML solution  Take 15 mLs by mouth in the morning, at noon, and at bedtime. 09/27/21   [provider]  LANTUS SOLOSTAR 100 UNIT/ML Solostar Pen Inject 10 Units into the skin at bedtime. 09/25/21   Lacinda Axon, MD  levETIRAcetam (KEPPRA) 500 MG tablet Take 1 tablet (500 mg total) by mouth 2 (two) times daily. 02/12/21   Orvis Brill, MD  lisinopril (ZESTRIL) 5 MG tablet Take 1 tablet (5 mg total) by mouth daily. 09/30/21   Lacinda Axon, MD  memantine (NAMENDA) 10 MG tablet TAKE 1 TABLET BY MOUTH TWICE DAILY 11/11/20   [name], Areeg N, DO  mirtazapine (REMERON) 7.5 MG tablet Take 7.5 mg by mouth at bedtime. 06/02/21   [provider]  Misc Natural Products (IMMUNE FORMULA PO) Take 1 tablet by mouth daily.    [provider]  Multiple Vitamins-Minerals (MULTI FOR HER 50+) TABS Take 1 tablet by mouth daily with breakfast.    [provider]  NON FORMULARY See admin instructions. Compression Stockings apply in the morning and remove at bedtime    [provider]  pantoprazole (PROTONIX) 40 MG tablet Take 1 tablet (40 mg total) by mouth 2 (two) times daily. 12/26/20   Lacinda Axon, MD  torsemide (DEMADEX) 20 MG tablet Take 1 tablet (20 mg total) by mouth as needed (please take 1 '20mg'$  tablet as needed for weight gain of 2 pounds overnight or 5 pounds in a week). 03/05/21 10/01/21  Lenna Sciara, NP  traMADol (ULTRAM) 50 MG tablet Take 50 mg by mouth every 6 (six) hours as needed. 03/06/21   [provider]  TRULICITY 3 YT/0.1SW SOPN Inject 3 mg into the skin once a week. 09/25/21   Lacinda Axon, MD    Physical Exam: Vitals:   11/21/21 0034 11/21/21 0100 11/21/21 0135 11/21/21 0140  BP:  (!) 148/66 (!) 144/58   Pulse: 71 66 77 77  Resp: 18 16 (!) 24 15  Temp:      TempSrc:      SpO2: 97% 100% 99% 99%  Weight:      Height:        Physical Exam Vitals reviewed.  Constitutional:      General: She is not in acute distress. HENT:      Head: Normocephalic and atraumatic.  Eyes:     Extraocular Movements: Extraocular movements intact.  Cardiovascular:     Rate and Rhythm: Normal rate and regular rhythm.     Pulses: Normal pulses.     Heart sounds: Murmur heard.  Pulmonary:     Effort: Pulmonary effort is normal. No respiratory distress.     Breath sounds: Normal breath sounds. No wheezing or rales.  Abdominal:     General: Bowel sounds are normal. There is no distension.     Palpations: Abdomen is soft.     Tenderness: There is no abdominal tenderness. There is no guarding.  Musculoskeletal:        General: No swelling or tenderness.     Cervical back:  Normal range of motion.  Skin:    General: Skin is warm and dry.  Neurological:     General: No focal deficit present.     Mental Status: She is alert.     Comments: Oriented to person and place     Labs on Admission: I have personally reviewed following labs and imaging studies  CBC: Recent Labs  Lab 11/20/21 2323  WBC 7.7  NEUTROABS 5.4  HGB 7.9*  HCT 26.2*  MCV 101.2*  PLT 161   Basic Metabolic Panel: Recent Labs  Lab 11/20/21 2323  NA 145  K 5.7*  CL 117*  CO2 24  GLUCOSE 270*  BUN 24*  CREATININE 1.12*  CALCIUM 8.1*   GFR: Estimated Creatinine Clearance: 32.6 mL/min (A) (by C-G formula based on SCr of 1.12 mg/dL (H)). Liver Function Tests: Recent Labs  Lab 11/20/21 2323  AST 47*  ALT 16  ALKPHOS 112  BILITOT 0.7  PROT 4.7*  ALBUMIN 2.3*   No results for input(s): "LIPASE", "AMYLASE" in the last 168 hours. No results for input(s): "AMMONIA" in the last 168 hours. Coagulation Profile: Recent Labs  Lab 11/20/21 2323  INR 1.2   Cardiac Enzymes: No results for input(s): "CKTOTAL", "CKMB", "CKMBINDEX", "TROPONINI" in the last 168 hours. BNP (last 3 results) No results for input(s): "PROBNP" in the last 8760 hours. HbA1C: No results for input(s): "HGBA1C" in the last 72 hours. CBG: No results for input(s): "GLUCAP" in  the last 168 hours. Lipid Profile: No results for input(s): "CHOL", "HDL", "LDLCALC", "TRIG", "CHOLHDL", "LDLDIRECT" in the last 72 hours. Thyroid Function Tests: No results for input(s): "TSH", "T4TOTAL", "FREET4", "T3FREE", "THYROIDAB" in the last 72 hours. Anemia Panel: No results for input(s): "VITAMINB12", "FOLATE", "FERRITIN", "TIBC", "IRON", "RETICCTPCT" in the last 72 hours. Urine analysis:    Component Value Date/Time   COLORURINE YELLOW 03/03/2021 0110   APPEARANCEUR CLEAR 03/03/2021 0110   LABSPEC 1.011 03/03/2021 0110   LABSPEC 1.025 10/19/2006 1254   PHURINE 6.0 03/03/2021 0110   GLUCOSEU 150 (A) 03/03/2021 0110   HGBUR NEGATIVE 03/03/2021 0110   BILIRUBINUR NEGATIVE 03/03/2021 0110   BILIRUBINUR neg 05/21/2016 1605   BILIRUBINUR Negative 10/19/2006 1254   KETONESUR NEGATIVE 03/03/2021 0110   PROTEINUR 30 (A) 09/17/2021 1242   UROBILINOGEN 0.2 05/21/2016 1605   UROBILINOGEN 0.2 10/02/2014 2307   NITRITE NEGATIVE 03/03/2021 0110   LEUKOCYTESUR NEGATIVE 03/03/2021 0110   LEUKOCYTESUR Negative 10/19/2006 1254    Radiological Exams on Admission: No results found.  EKG: No EKG done in the ED.  Ordered now and currently pending.  Assessment and Plan  Acute upper GI bleed Acute on chronic anemia secondary to blood loss Patient presenting after an episode of hematemesis at her nursing facility.  No recurrence in the ED.  She is hemodynamically stable.  Abdominal exam benign.  Hemoglobin 7.9, was 9.1 on 11/12/2021.  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 Previous EGDs done in December 2022 showing gastric ulcers and gastritis. -Continue Protonix infusion -Gentle IV fluid hydration -Keep n.p.o. -Type and screen -Serial H&H every 4 hours, transfuse PRBCs if Hgb <7 -Since patient is currently stable, I have sent a message to Dr. Therisa Doyne with [facility] requesting consultation in the morning.  If any signs of hemodynamic  instability, GI to be urgently called tonight.  Mild AKI Likely prerenal in the setting of acute GI bleed.  Creatinine 1.1, baseline 0.8-0.9. -Gentle IV fluid hydration -Monitor renal function -Avoid nephrotoxic  agents/hold home ACE inhibitor and diuretic  ?Hyperkalemia  Potassium 5.7 (sample hemolyzed). -Stat EKG -Repeat labs  Hereditary hemorrhagic telangiectasia History of iron deficiency anemia, ?iron overload Followed by Dr. Burr Medico and on Avastin PRN due to high proteinuria.  Also on bevacizumab and receives iron infusions.  Liver MRI done in December 2022 showing signs of iron deposits in the liver, spleen, and marrow space. -Monitor H&H, transfuse PRBCs if hemoglobin less than 7 -Consult hematology in the morning  Insulin-dependent type 2 diabetes Glucose in the 200s.  A1c 7.3 on 09/25/2021. -Sensitive sliding scale insulin every 4 hours -Pharmacy med rec pending.  Chronic diastolic CHF No signs of volume overload. Echo done in August 2022 showing EF 65 to 29%, grade 2 diastolic dysfunction, mild MR, mild AS.  Loud murmur appreciated on exam. -Check BNP and repeat echocardiogram  Hypertension Stable. -Hold antihypertensives at this time in the setting of acute GI bleed  Seizure disorder -Continue Keppra after pharmacy med rec is done.  Dementia -Continue home meds after pharmacy med rec is done and when no longer NPO.  DVT prophylaxis: SCDs Code Status: Full Code per review of records from nursing home at bedside and also confirmed with the patient. Family Communication: No family available at this time. Consults called: GI Level of care: Step Down Unit Admission status: It is my clinical opinion that referral for OBSERVATION is reasonable and necessary in this patient based on the above information provided. The aforementioned taken together are felt to place the patient at high risk for further clinical deterioration. However, it is anticipated that the patient may be  medically stable for discharge from the hospital within 24 to 48 hours.   Shela Leff MD [facility]  If 7PM-7AM, please contact night-coverage [email or web address]  11/21/2021, 2:19 AM

## 2021-11-21 NOTE — TOC Initial Note (Signed)
Transition of Care [facility]) - Initial/Assessment Note    Patient Details  Name: [name] MRN: [id number] Date of Birth: [birth year]  [facility] CM/SW Contact:    Leeroy Cha, RN Phone Number: 11/21/2021, 8:51 AM  Clinical Narrative:                  Transition of Care (TOC) Screening Note   Patient Details  Name: [name] Date of Birth: 20-Mar-1940   [facility]) CM/SW Contact:    Leeroy Cha, RN Phone Number: 11/21/2021, 8:51 AM    Transition of Care Department [facility]) has reviewed patient and no TOC needs have been identified at this time. We will continue to monitor patient advancement through interdisciplinary progression rounds. If new patient transition needs arise, please place a TOC consult.    Expected Discharge Plan: Home/Self Care Barriers to Discharge: Continued Medical Work up   Patient Goals and CMS Choice Patient states their goals for this hospitalization and ongoing recovery are:: to go home CMS Medicare.gov Compare Post Acute Care list provided to:: Patient    Expected Discharge Plan and Services Expected Discharge Plan: Home/Self Care   Discharge Planning Services: CM Consult   Living arrangements for the past 2 months: Single Family Home                                      Prior Living Arrangements/Services Living arrangements for the past 2 months: Single Family Home Lives with:: Spouse Patient language and need for interpreter reviewed:: Yes Do you feel safe going back to the place where you live?: Yes            Criminal Activity/Legal Involvement Pertinent to Current Situation/Hospitalization: No - Comment as needed  Activities of Daily Living      Permission Sought/Granted                  Emotional Assessment Appearance:: Appears stated age Attitude/Demeanor/Rapport: Engaged Affect (typically observed): Calm Orientation: : Oriented to Self, Oriented to Place,  Oriented to  Time, Oriented to Situation Alcohol / Substance Use: Not Applicable Psych Involvement: No (comment)  Admission diagnosis:  Prerenal azotemia [R79.89] Acute upper GI bleed [K92.2] Elevated AST (SGOT) [R74.01] Hematemesis with nausea [K92.0] Patient Active Problem List   Diagnosis Date Noted   Acute upper GI bleed 11/21/2021   AKI (acute kidney injury) ([place]) 01/28/2021   GI bleed 01/18/2021   Bullous lesion 10/08/2020   Hearing loss 05/29/2020   Neuropathy 07/27/2019   Port-A-Cath in place 05/23/2018   Iron deficiency anemia due to chronic blood loss 04/19/2018   Acute right hip pain 03/10/2018   Hyperpigmented skin lesion 02/07/2018   Leg weakness, bilateral 12/23/2017   Pain localized to upper abdomen 10/05/2017   Lower extremity pain, bilateral 07/15/2017   Pulmonary HTN ([place]) 04/01/2017   Heart murmur 04/01/2017   Overgrown toenails 03/18/2017   Osteopenia after menopause 01/13/2017   Epistaxis 07/30/2016   Venous insufficiency of both lower extremities 04/27/2016   Acute on chronic blood loss anemia    Dry skin 02/17/2016   Peripheral vascular disease ([place]) 12/30/2015   Pain in joint of right shoulder 10/22/2015   PAF (paroxysmal atrial fibrillation) ([place]) 09/29/2015   Healthcare maintenance 06/20/2015   Seborrheic keratosis 06/20/2015   Dementia ([place]) 01/30/2015   Simple febrile convulsions ([place]) 10/21/2014   Hyperammonemia ([place]) 10/03/2014  CKD (chronic kidney disease), stage III ([name]    Seizures ([place]) 09/2014   Chronic diastolic CHF (congestive heart failure) ([place]) 10/03/2013   Acute on chronic heart failure with preserved ejection fraction (HFpEF) ([place]) 10/03/2013   Type 2 diabetes mellitus ([place]) 09/15/2012   Essential hypertension 03/02/2012   Fatigue 07/10/2011   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

## 2021-11-21 NOTE — Care Plan (Signed)
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  Patient presented for the evaluation of hematemesis.  She is hemodynamically stable.  Her hemoglobin has dropped to 6.5 requiring 2 units of blood transfusion.  GI is consulted, Patient is kept n.p.o. maintained on IV hydration, scheduled for EGD today if consent is obtained from niece.  Patient was seen and examined at bedside.  Overnight events noted.

## 2021-11-21 NOTE — Consult Note (Addendum)
[facility] Gastroenterology Consult  Referring Provider: No ref. provider found Primary Care Physician:  [name], MD Primary Gastroenterologist: [facility], Dr. Therisa Doyne  Reason for Consultation: Hematemesis  SUBJECTIVE:   HPI: [name] is a 81 y.o. female with past medical history significant for hereditary hemorrhagic telangiectasia, gastroesophageal reflux disease, gastric arteriovenous malformation, pulmonary hypertension, type 2 diabetes mellitus, hypertension.  She is known to the [place], underwent EGD on 01/19/2021 with Dr. Michail Sermon for coffee-ground emesis with an oozing cratered ulcer with pigmented material in the lesser curvature of the stomach (7 mm) treated with epinephrine and hemoclips x5, edema in the gastric antrum and a normal duodenum.  Follow-up EGD with Dr. Therisa Doyne on 02/06/2021 showed many nonbleeding superficial gastric ulcers with clean base in the cardia, fundus and greater curvature consistent with NG trauma, 2 endoclips in the gastric body, normal duodenum.  Patient presented to [facility] on 11/20/2021 for hematemesis.  She is a relatively poor historian.  She did state having hematemesis for few days.  She was unable to characterize color of the emesis.  She denied abdominal pain.  Denied chest pain or shortness of breath.  Noted that she is cold.  I attempted to contact patient's niece via telephone though no answer, left message to contact hospital.  Per nursing staff patient's spouse is not a reliable historian, did not call him.  On presentation hemoglobin 6.5, baseline appears to be 9-10, BUN/creatinine 31/1.20, AST/ALT 24/15, alkaline phosphatase 103, total bilirubin 0.7.  No abdominal imaging has been completed.  Per ICU nursing staff at bedside, patient has not had any further hematemesis since presentation, no bowel movement since presentation.  She is pending blood transfusion.  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/2016   Hematemesis 12/04/2017   History of blood transfusion "several"   History of epistaxis    HTN (hypertension), benign 03/02/2012   Hyperlipidemia    Iron deficiency anemia    chronic infusions"   Lichen planus    Both lower extremities   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   Past Surgical History:  Procedure Laterality Date   CATARACT EXTRACTION     "I think it was just one eye"   ESOPHAGOGASTRODUODENOSCOPY  02/26/2011   Procedure: ESOPHAGOGASTRODUODENOSCOPY (EGD);  Surgeon: [name];  [facility];  Service: Endoscopy;  Laterality: N/A;   ESOPHAGOGASTRODUODENOSCOPY N/A 11/08/2012   Procedure: ESOPHAGOGASTRODUODENOSCOPY (EGD);  Surgeon: [name];  [facility];  Service: Endoscopy;  Laterality: N/A;   ESOPHAGOGASTRODUODENOSCOPY N/A 10/04/2013   Procedure: ESOPHAGOGASTRODUODENOSCOPY (EGD);  Surgeon: [name];  [facility];  Service: Endoscopy;  Laterality: N/A;  with  APC on stand-by   ESOPHAGOGASTRODUODENOSCOPY N/A 07/06/2014   Procedure: ESOPHAGOGASTRODUODENOSCOPY (EGD);  Surgeon: Clarene Essex, MD;  [name];  Service: Endoscopy;  Laterality: N/A;   ESOPHAGOGASTRODUODENOSCOPY N/A 09/05/2014   Procedure: ESOPHAGOGASTRODUODENOSCOPY (EGD);   Surgeon: Laurence Spates, MD;  [name];  Service: Endoscopy;  Laterality: N/A;  APC on standby to control bleeding   ESOPHAGOGASTRODUODENOSCOPY N/A 11/29/2014   Procedure: ESOPHAGOGASTRODUODENOSCOPY (EGD);  Surgeon: [name], MD;  Location[facility];  Service: Endoscopy;  Laterality: N/A;   ESOPHAGOGASTRODUODENOSCOPY N/A 09/28/2015   Procedure: ESOPHAGOGASTRODUODENOSCOPY (EGD);  Surgeon: Clarene Essex, MD;  Location: [facility] ENDOSCOPY;  Service: Endoscopy;  Laterality: N/A;   ESOPHAGOGASTRODUODENOSCOPY (EGD) WITH PROPOFOL N/A 12/04/2017   Procedure: ESOPHAGOGASTRODUODENOSCOPY (EGD) WITH PROPOFOL;  Surgeon: [name], MD;  Location: [place];  Service: Endoscopy;  Laterality: N/A;   ESOPHAGOGASTRODUODENOSCOPY (EGD) WITH PROPOFOL N/A 12/24/2020   Procedure: ESOPHAGOGASTRODUODENOSCOPY (EGD) WITH PROPOFOL;  Surgeon: Arta Silence, MD;  Location: [place];  Service: Endoscopy;  Laterality: N/A;   ESOPHAGOGASTRODUODENOSCOPY (EGD) WITH PROPOFOL N/A 01/19/2021   Procedure: ESOPHAGOGASTRODUODENOSCOPY (EGD) WITH PROPOFOL;  Surgeon: [name], MD;  Location: [place];  Service: Endoscopy;  Laterality: N/A;   ESOPHAGOGASTRODUODENOSCOPY (EGD) WITH PROPOFOL N/A 02/06/2021   Procedure: ESOPHAGOGASTRODUODENOSCOPY (EGD) WITH PROPOFOL;  Surgeon: Ronnette Juniper, MD;  Location: [place];  Service: Gastroenterology;  Laterality: N/A;   ESOPHAGOGASTRODUODENOSCOPY ENDOSCOPY  08/19/2006   with laser treatment   HEMOSTASIS CLIP PLACEMENT  12/24/2020   Procedure: HEMOSTASIS CLIP PLACEMENT;  Surgeon: Arta Silence, MD;  Location: [facility] ENDOSCOPY;  Service: Endoscopy;;   HEMOSTASIS CLIP PLACEMENT  01/19/2021   Procedure: HEMOSTASIS CLIP PLACEMENT;  Surgeon: [name], MD;  Location: [place];  Service: Endoscopy;;   HOT HEMOSTASIS N/A 07/06/2014   Procedure: HOT HEMOSTASIS (ARGON PLASMA COAGULATION/BICAP);  Surgeon: Clarene Essex, MD;  [name];  Service:  Endoscopy;  Laterality: N/A;   HOT HEMOSTASIS N/A 09/28/2015   Procedure: HOT HEMOSTASIS (ARGON PLASMA COAGULATION/BICAP);  Surgeon: Clarene Essex, MD;  Location: [facility] ENDOSCOPY;  Service: Endoscopy;  Laterality: N/A;   HOT HEMOSTASIS N/A 12/04/2017   Procedure: HOT HEMOSTASIS (ARGON PLASMA COAGULATION/BICAP);  Surgeon: [name], MD;  Location: [place];  Service: Endoscopy;  Laterality: N/A;   IR ANGIOGRAM SELECTIVE EACH ADDITIONAL VESSEL  02/05/2021   IR ANGIOGRAM VISCERAL SELECTIVE  02/05/2021   IR EMBO ART  VEN HEMORR LYMPH EXTRAV  INC GUIDE ROADMAPPING  02/05/2021   IR US GUIDE VASC ACCESS RIGHT  02/05/2021   NASAL HEMORRHAGE CONTROL     "for bleeding"    SAVORY DILATION  02/26/2011   Procedure: SAVORY DILATION;  Surgeon: [name], MD;  Location: [name];  Service: Endoscopy;  Laterality: N/A;  c-arm needed   SCLEROTHERAPY  12/24/2020   Procedure: SCLEROTHERAPY;  Surgeon: Arta Silence, MD;  Location: [facility] ENDOSCOPY;  Service: Endoscopy;;   SCLEROTHERAPY  01/19/2021   Procedure: Clide Deutscher;  Surgeon: [name], MD;  Location: [facility] ENDOSCOPY;  Service: Endoscopy;;   SUBMUCOSAL INJECTION  12/04/2017   Procedure: SUBMUCOSAL INJECTION;  Surgeon: [name], MD;  Location: [place];  Service: Endoscopy;;   Prior to Admission medications   Medication Sig Start Date End Date Taking? Authorizing Provider  amLODipine (NORVASC) 5 MG tablet Take 1 tablet (5 mg total) by mouth daily. 09/25/21 09/20/22  Lacinda Axon, MD  diclofenac Sodium (VOLTAREN) 1 % GEL Apply 2 g topically 4 (four) times daily. 12/26/20   Lacinda Axon, MD  gabapentin (NEURONTIN) 100 MG capsule Take 1  capsule (100 mg total) by mouth 2 (two) times daily. 02/12/21   Orvis Brill, MD  insulin lispro (HUMALOG KWIKPEN) 100 UNIT/ML KwikPen Inject 3 Units into the skin 3 (three) times daily. 09/25/21   Lacinda Axon, MD  lactulose (CHRONULAC) 10 GM/15ML solution Take 15 mLs by  mouth in the morning, at noon, and at bedtime. 09/27/21   [provider]  LANTUS SOLOSTAR 100 UNIT/ML Solostar Pen Inject 10 Units into the skin at bedtime. 09/25/21   Lacinda Axon, MD  levETIRAcetam (KEPPRA) 500 MG tablet Take 1 tablet (500 mg total) by mouth 2 (two) times daily. 02/12/21   Orvis Brill, MD  lisinopril (ZESTRIL) 5 MG tablet Take 1 tablet (5 mg total) by mouth daily. 09/30/21   Lacinda Axon, MD  memantine (NAMENDA) 10 MG tablet TAKE 1 TABLET BY MOUTH TWICE DAILY 11/11/20   [name], Areeg N, DO  mirtazapine (REMERON) 7.5 MG tablet Take 7.5 mg by mouth at bedtime. 06/02/21   [provider]  Misc Natural Products (IMMUNE FORMULA PO) Take 1 tablet by mouth daily.    [provider]  Multiple Vitamins-Minerals (MULTI FOR HER 50+) TABS Take 1 tablet by mouth daily with breakfast.    [provider]  NON FORMULARY See admin instructions. Compression Stockings apply in the morning and remove at bedtime    [provider]  pantoprazole (PROTONIX) 40 MG tablet Take 1 tablet (40 mg total) by mouth 2 (two) times daily. 12/26/20   Lacinda Axon, MD  torsemide (DEMADEX) 20 MG tablet Take 1 tablet (20 mg total) by mouth as needed (please take 1 '20mg'$  tablet as needed for weight gain of 2 pounds overnight or 5 pounds in a week). 03/05/21 10/01/21  Lenna Sciara, NP  traMADol (ULTRAM) 50 MG tablet Take 50 mg by mouth every 6 (six) hours as needed. 03/06/21   [provider]  TRULICITY 3 KX/3.8HW SOPN Inject 3 mg into the skin once a week. 09/25/21   Lacinda Axon, MD   Current Facility-Administered Medications  Medication Dose Route Frequency Provider Last Rate Last Admin   0.9 %  sodium chloride infusion (Manually program via Guardrails IV Fluids)   Intravenous Once Shawna Clamp, MD       acetaminophen (TYLENOL) tablet 650 mg  650 mg Oral Q6H PRN Shela Leff, MD       Or   acetaminophen (TYLENOL) suppository 650  mg  650 mg Rectal Q6H PRN Shela Leff, MD       Chlorhexidine Gluconate Cloth 2 % PADS 6 each  6 each Topical Daily Shela Leff, MD   6 each at 11/21/21 0400   insulin aspart (novoLOG) injection 0-9 Units  0-9 Units Subcutaneous Q4H Shela Leff, MD   2 Units at 11/21/21 0841   mupirocin ointment (BACTROBAN) 2 % 1 Application  1 Application Nasal BID Shela Leff, MD       Oral care mouth rinse  15 mL Mouth Rinse PRN Shela Leff, MD       Derrill Memo ON 11/24/2021] pantoprazole (PROTONIX) injection 40 mg  40 mg Intravenous Q12H Shela Leff, MD       pantoprozole (PROTONIX) 80 mg /NS 100 mL infusion  8 mg/hr Intravenous Continuous Shela Leff, MD 10 mL/hr at 11/21/21 0518 8 mg/hr at 11/21/21 0518   sodium chloride flush (NS) 0.9 % injection 10-40 mL  10-40 mL Intracatheter Q12H Shawna Clamp, MD       sodium chloride  flush (NS) 0.9 % injection 10-40 mL  10-40 mL Intracatheter PRN Shawna Clamp, MD       Facility-Administered Medications Ordered in Other Encounters  Medication Dose Route Frequency Provider Last Rate Last Admin   sodium chloride 0.9 % injection 10 mL  10 mL Intracatheter PRN Truitt Merle, MD       sodium chloride flush (NS) 0.9 % injection 10 mL  10 mL Intracatheter PRN Truitt Merle, MD   10 mL at 08/18/19 1717   Allergies as of 11/20/2021 - Review Complete 11/20/2021  Allergen Reaction Noted   Aspirin Nausea And Vomiting 05/12/2006   Family History  Problem Relation Age of Onset   Healthy Mother    Stroke Father    Diabetes Niece    Breast cancer Other    Malignant hyperthermia Neg Hx    Seizures Neg Hx    Lung cancer Neg Hx    Social History   Socioeconomic History   Marital status: Married    Spouse name: [name]   Number of children: 0   Years of education: 9   Highest education level: Not on file  Occupational History   Occupation: Retired Systems developer: RETIRED  Tobacco Use   Smoking status: Former     Packs/day: 1.00    Years: 20.00    Total pack years: 20.00    Types: Cigarettes    Quit date: 02/10/1971    Years since quitting: 50.8   Smokeless tobacco: Never   Tobacco comments:    09/15/2012 "smoked 50-60 yr ago"  Vaping Use   Vaping Use: Never used  Substance and Sexual Activity   Alcohol use: No    Alcohol/week: 0.0 standard drinks of alcohol   Drug use: No   Sexual activity: Not on file  Other Topics Concern   Not on file  Social History Narrative   Married, lives with husband, [name].  Ambulates without assistance.     Caffeine use: none   Social Determinants of Health   Financial Resource Strain: Low Risk  (09/06/2019)   Overall Financial Resource Strain (CARDIA)    Difficulty of Paying Living Expenses: Not very hard  Food Insecurity: No Food Insecurity (09/06/2019)   Hunger Vital Sign    Worried About Running Out of Food in the Last Year: Never true    Ran Out of Food in the Last Year: Never true  Transportation Needs: No Transportation Needs (09/06/2019)   PRAPARE - Hydrologist (Medical): No    Lack of Transportation (Non-Medical): No  Physical Activity: Inactive (09/06/2019)   Exercise Vital Sign    Days of Exercise per Week: 0 days    Minutes of Exercise per Session: 0 min  Stress: Not on file  Social Connections: Not on file  Intimate Partner Violence: Not At Risk (09/06/2019)   Humiliation, Afraid, Rape, and Kick questionnaire    Fear of Current or Ex-Partner: No    Emotionally Abused: No    Physically Abused: No    Sexually Abused: No   Review of Systems:  Review of Systems  Respiratory:  Negative for shortness of breath.   Cardiovascular:  Negative for chest pain.  Gastrointestinal:  Positive for vomiting. Negative for abdominal pain and blood in stool.    OBJECTIVE:   Temp:  [98.5 F (36.9 C)-100.2 F (37.9 C)] 99.3 F (37.4 C) (10/13 0800) Pulse Rate:  [66-81] 81 (10/13 0800) Resp:  [7-24] 22 (10/13 0800) BP:  (  122-156)/(44-66) 142/51 (10/13 0800) SpO2:  [95 %-100 %] 95 % (10/13 0800) Weight:  [55 kg-58.3 kg] 58.3 kg (10/13 0420) Last BM Date :  (pta) Physical Exam Constitutional:      General: She is not in acute distress.    Appearance: She is not ill-appearing, toxic-appearing or diaphoretic.  Cardiovascular:     Rate and Rhythm: Normal rate and regular rhythm.  Pulmonary:     Effort: No respiratory distress.     Breath sounds: Normal breath sounds.     Comments: No supplemental oxygen in place at time of exam Abdominal:     General: Bowel sounds are normal. There is no distension.     Palpations: Abdomen is soft.     Tenderness: There is no abdominal tenderness. There is no guarding.  Neurological:     Mental Status: She is alert.     Labs: Recent Labs    11/20/21 2323 11/21/21 0306 11/21/21 0711  WBC 7.7  --   --   HGB 7.9* 7.0* 6.5*  HCT 26.2* 22.3* 21.0*  PLT 202  --   --    BMET Recent Labs    11/20/21 2323 11/21/21 0306  NA 145 145  K 5.7* 4.8  CL 117* 116*  CO2 24 23  GLUCOSE 270* 300*  BUN 24* 31*  CREATININE 1.12* 1.20*  CALCIUM 8.1* 8.1*   LFT Recent Labs    11/21/21 0306  PROT 4.4*  ALBUMIN 2.2*  AST 24  ALT 15  ALKPHOS 103  BILITOT 0.7   PT/INR Recent Labs    11/20/21 2323  LABPROT 14.7  INR 1.2   Diagnostic imaging: No results found.  IMPRESSION: Hematemesis, prior to admission Normocytic, acute blood loss anemia Hereditary hemorrhagic telangiectasia History gastric ulcer December 2022 requiring epinephrine and hemoclip placement  Other: pulmonary hypertension, diastolic congestive heart failure, hypertension, type 2 diabetes mellitus  PLAN: -Recommend 2 large bore Ivs -Recommend IV PPI Q12Hr -Recommend maintain NPO status for now -Blood transfusion as ordered, trend H/H, transfuse for Hgb < 7  -Monitor for recurrent hematemesis, monitor bowel movements for melena/hematochezia -Patient appears to understand her clinical  situation though not entirely, appears that consent for other hospital procedures has been obtained from her niece -I personally attempted to contact patient's niece to discuss EGD today, no answer -Will plan to resuscitate patient with IV fluids and blood, maintain NPO for now, attempt to contact patient's niece again today and determine if appropriate for EGD -GI will follow   LOS: 0 days   Danton Clap, DO [facility]  INTERVAL UPDATE: 2:46 PM  Contacted and discussed possible EGD with patient's niece, [name], via telephone.  Obtained verbal informed consent from [name] to proceed with EGD.  Verbal consent was witnessed by Andree Coss, PA.  Spoke to endoscopy team, available for EGD on 11/22/2021 at oh 7:30 AM.  Okay for diet today, will make n.p.o. at midnight for procedure tomorrow.  Danton Clap, DO [facility] Gastroenterology

## 2021-11-22 ENCOUNTER — Encounter (HOSPITAL_COMMUNITY): Payer: Self-pay | Admitting: Internal Medicine

## 2021-11-22 ENCOUNTER — Inpatient Hospital Stay (HOSPITAL_COMMUNITY): Payer: Medicare Other | Admitting: Anesthesiology

## 2021-11-22 ENCOUNTER — Encounter (HOSPITAL_COMMUNITY)
Admission: EM | Disposition: A | Discharge status: Skilled Nursing Facility | Payer: Self-pay | Source: Skilled Nursing Facility | Attending: Family Medicine

## 2021-11-22 ENCOUNTER — Inpatient Hospital Stay (HOSPITAL_COMMUNITY): Payer: Medicare Other

## 2021-11-22 DIAGNOSIS — I5032 Chronic diastolic (congestive) heart failure: Secondary | ICD-10-CM | POA: Diagnosis not present

## 2021-11-22 DIAGNOSIS — R011 Cardiac murmur, unspecified: Secondary | ICD-10-CM | POA: Diagnosis not present

## 2021-11-22 DIAGNOSIS — K922 Gastrointestinal hemorrhage, unspecified: Secondary | ICD-10-CM | POA: Diagnosis not present

## 2021-11-22 DIAGNOSIS — K259 Gastric ulcer, unspecified as acute or chronic, without hemorrhage or perforation: Secondary | ICD-10-CM

## 2021-11-22 DIAGNOSIS — K449 Diaphragmatic hernia without obstruction or gangrene: Secondary | ICD-10-CM

## 2021-11-22 DIAGNOSIS — F0390 Unspecified dementia without behavioral disturbance: Secondary | ICD-10-CM

## 2021-11-22 DIAGNOSIS — Z8711 Personal history of peptic ulcer disease: Secondary | ICD-10-CM

## 2021-11-22 HISTORY — PX: HEMOSTASIS CLIP PLACEMENT: SHX6857

## 2021-11-22 HISTORY — PX: ESOPHAGOGASTRODUODENOSCOPY: SHX5428

## 2021-11-22 HISTORY — PX: HOT HEMOSTASIS: SHX5433

## 2021-11-22 LAB — PHOSPHORUS: Phosphorus: 3 mg/dL (ref 2.5–4.6)

## 2021-11-22 LAB — TYPE AND SCREEN
ABO/RH(D): A POS
Antibody Screen: NEGATIVE
Unit division: 0

## 2021-11-22 LAB — GLUCOSE, CAPILLARY
Glucose-Capillary: 106 mg/dL — ABNORMAL HIGH (ref 70–99)
Glucose-Capillary: 134 mg/dL — ABNORMAL HIGH (ref 70–99)
Glucose-Capillary: 122 mg/dL — ABNORMAL HIGH (ref 70–99)
Glucose-Capillary: 120 mg/dL — ABNORMAL HIGH (ref 70–99)
Glucose-Capillary: 179 mg/dL — ABNORMAL HIGH (ref 70–99)
Glucose-Capillary: 129 mg/dL — ABNORMAL HIGH (ref 70–99)

## 2021-11-22 LAB — BASIC METABOLIC PANEL
Anion gap: 2 — ABNORMAL LOW (ref 5–15)
BUN: 38 mg/dL — ABNORMAL HIGH (ref 8–23)
CO2: 25 mmol/L (ref 22–32)
Calcium: 8.2 mg/dL — ABNORMAL LOW (ref 8.9–10.3)
Chloride: 119 mmol/L — ABNORMAL HIGH (ref 98–111)
Creatinine, Ser: 1.31 mg/dL — ABNORMAL HIGH (ref 0.44–1.00)
GFR, Estimated: 41 mL/min — ABNORMAL LOW (ref >60)
Glucose, Bld: 114 mg/dL — ABNORMAL HIGH (ref 70–99)
Potassium: 3.7 mmol/L (ref 3.5–5.1)
Sodium: 146 mmol/L — ABNORMAL HIGH (ref 135–145)

## 2021-11-22 LAB — HEMOGLOBIN AND HEMATOCRIT, BLOOD
HCT: 23.1 % — ABNORMAL LOW (ref 36.0–46.0)
HCT: 23.8 % — ABNORMAL LOW (ref 36.0–46.0)
Hemoglobin: 7.4 g/dL — ABNORMAL LOW (ref 12.0–15.0)
Hemoglobin: 7.5 g/dL — ABNORMAL LOW (ref 12.0–15.0)

## 2021-11-22 LAB — ECHOCARDIOGRAM COMPLETE
Height: 63 in
S' Lateral: 2.3 cm
Weight: 2056.45 oz

## 2021-11-22 LAB — VITAMIN B12: Vitamin B-12: 296 pg/mL (ref 180–914)

## 2021-11-22 LAB — BPAM RBC
Blood Product Expiration Date: 202311082359
ISSUE DATE / TIME: 202310130947
Unit Type and Rh: 6200

## 2021-11-22 LAB — FOLATE: Folate: 9.4 ng/mL (ref >5.9)

## 2021-11-22 LAB — MAGNESIUM: Magnesium: 2 mg/dL (ref 1.7–2.4)

## 2021-11-22 SURGERY — EGD (ESOPHAGOGASTRODUODENOSCOPY)
Anesthesia: Monitor Anesthesia Care

## 2021-11-22 MED ORDER — SODIUM CHLORIDE 0.9 % IV SOLN: INTRAVENOUS | Status: DC

## 2021-11-22 MED ORDER — PROPOFOL 500 MG/50ML IV EMUL
INTRAVENOUS | Status: DC | PRN
  Administered 2021-11-22: 125 ug/kg/min via INTRAVENOUS

## 2021-11-22 MED ORDER — LEVETIRACETAM 500 MG PO TABS
500.0000 mg | ORAL_TABLET | Freq: Two times a day (BID) | ORAL | Status: DC
  Administered 2021-11-22 – 2021-11-23 (×3): 500 mg via ORAL
  Filled 2021-11-22 (×3): qty 1

## 2021-11-22 MED ORDER — LACTATED RINGERS IV SOLN
INTRAVENOUS | Status: AC | PRN
  Administered 2021-11-22: 1000 mL via INTRAVENOUS

## 2021-11-22 NOTE — Op Note (Signed)
[facility] Patient Name: [name] Procedure Date: 11/22/2021 MRN: [id number] Attending MD: Danton Clap DO, DO Date of Birth: 02/03/1941 CSN: [id number] Age: 81 Admit Type: Inpatient Procedure:                Upper GI endoscopy Indications:              Hematemesis, Personal history of peptic ulcer                            disease Providers:                Danton Clap DO, DO, Carlyn Reichert, RN, Brien Mates, RNFA, Luan Moore, Technician Referring MD:              Medicines:                See the Anesthesia note for documentation of the                            administered medications Complications:            No immediate complications. Estimated blood loss:                            Minimal. Estimated Blood Loss:     Estimated blood loss was minimal. Procedure:                Pre-Anesthesia Assessment:                           - ASA Grade Assessment: III - A patient with severe                            systemic disease.                           - The anesthesia plan was to use monitored                            anesthesia care (MAC).                           After obtaining informed consent, the endoscope was                            passed under direct vision. Throughout the                            procedure, the patient's blood pressure, pulse, and                            oxygen saturations were monitored continuously. The                            [id number]-H190 ([id number]) Olympus endoscope was introduced  through the mouth, and advanced to the second part                            of duodenum. The upper GI endoscopy was                            accomplished without difficulty. The patient                            tolerated the procedure well. Scope In: Scope Out: Findings:      A 2 cm hiatal hernia was present.      One non-bleeding cratered gastric ulcer with  pigmented material was       found on the greater curvature of the gastric body. The lesion was 8 mm       in largest dimension. Coagulation for hemostasis using bipolar probe was       unsuccessful. Estimated blood loss was minimal. For hemostasis, four       hemostatic clips were successfully placed. There was no bleeding at the       end of the procedure.      The examined duodenum was normal. Impression:               - 2 cm hiatal hernia.                           - Non-bleeding gastric ulcer with pigmented                            material. Treatment not successful. Treated with                            bipolar cautery. Clips were placed.                           - Normal examined duodenum.                           - No specimens collected. Moderate Sedation:      Moderate (conscious) sedation was personally administered by an       anesthesia professional. The following parameters were monitored: oxygen       saturation, heart rate, blood pressure, and response to care. Recommendation:           - Clear liquid diet for 6 hours.                           - Use Protonix (pantoprazole) 40 mg PO BID for 2                            months.                           - Repeat upper endoscopy in 1 month to check                            healing.                           -  Return patient to hospital ward for ongoing care. Procedure Code(s):        --- Professional ---                           [phone], Esophagogastroduodenoscopy, flexible,                            transoral; with control of bleeding, any method Diagnosis Code(s):        --- Professional ---                           K44.9, Diaphragmatic hernia without obstruction or                            gangrene                           K25.9, Gastric ulcer, unspecified as acute or                            chronic, without hemorrhage or perforation                           Z87.11, Personal history of peptic ulcer  disease                           K92.0, Hematemesis CPT copyright 2019 American Medical Association. All rights reserved. The codes documented in this report are preliminary and upon coder review may  be revised to meet current compliance requirements. Dr Danton Clap, [place], DO 11/22/2021 8:26:17 AM Number of Addenda: 0

## 2021-11-22 NOTE — Interval H&P Note (Signed)
History and Physical Interval Note:  11/22/2021 7:25 AM  [name]  has presented today for surgery, with the diagnosis of Hematemesis, anemia.  The various methods of treatment have been discussed with the patient and family (her niece, [name]). After consideration of risks, benefits and other options for treatment, the patient and her niece have consented to  Procedure(s): ESOPHAGOGASTRODUODENOSCOPY (EGD) (N/A) as a surgical intervention.  The patient's history has been reviewed, patient examined, no change in status, stable for surgery.  I have reviewed the patient's chart and labs.  Questions were answered to the patient's satisfaction.     Loney Laurence

## 2021-11-22 NOTE — Transfer of Care (Signed)
Immediate Anesthesia Transfer of Care Note  Patient: [name]  Procedure(s) Performed: ESOPHAGOGASTRODUODENOSCOPY (EGD) HOT HEMOSTASIS (ARGON PLASMA COAGULATION/BICAP) HEMOSTASIS CLIP PLACEMENT  Patient Location: PACU  Anesthesia Type:MAC  Level of Consciousness: sedated, patient cooperative and responds to stimulation  Airway & Oxygen Therapy: Patient Spontanous Breathing and Patient connected to face mask oxygen  Post-op Assessment: Report given to RN and Post -op Vital signs reviewed and stable  Post vital signs: Reviewed and stable  Last Vitals:  Vitals Value Taken Time  BP    Temp    Pulse    Resp    SpO2      Last Pain:  Vitals:   11/22/21 0724  TempSrc: Temporal  PainSc: 0-No pain         Complications: No notable events documented.

## 2021-11-22 NOTE — Anesthesia Postprocedure Evaluation (Signed)
Anesthesia Post Note  Patient: Paighton Godette Greear  Procedure(s) Performed: ESOPHAGOGASTRODUODENOSCOPY (EGD) HOT HEMOSTASIS (ARGON PLASMA COAGULATION/BICAP) HEMOSTASIS CLIP PLACEMENT     Patient location during evaluation: PACU Anesthesia Type: MAC Level of consciousness: awake and alert Pain management: pain level controlled Vital Signs Assessment: post-procedure vital signs reviewed and stable Respiratory status: spontaneous breathing, nonlabored ventilation and respiratory function stable Cardiovascular status: stable and blood pressure returned to baseline Anesthetic complications: no   No notable events documented.  Last Vitals:  Vitals:   11/22/21 0825 11/22/21 0830  BP: (!) 151/55 (!) 169/54  Pulse:    Resp: 20 (!) 23  Temp:    SpO2: 98% 98%    Last Pain:  Vitals:   11/22/21 0830  TempSrc:   PainSc: 0-No pain                 Audry Pili

## 2021-11-22 NOTE — Progress Notes (Signed)
  Echocardiogram 2D Echocardiogram has been performed. Full exam could not be completed due to pt being uncooperative.  [name] 11/22/2021, 10:53 AM

## 2021-11-22 NOTE — Progress Notes (Signed)
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  Patient presented for the evaluation of hematemesis. She is hemodynamically stable.  Her hemoglobin has dropped to 6.5 requiring 2 units of blood transfusion. GI is consulted, Patient underwent EGD found to have nonbleeding gastric ulcer.  Assessment & Plan:   Principal Problem:   Acute upper GI bleed Active Problems:   Type 2 diabetes mellitus ([name]   HHT (hereditary hemorrhagic telangiectasia) ([name]   Chronic diastolic CHF (congestive heart failure) ([name]   Essential hypertension   Dementia ([name]   Acute on chronic blood loss anemia   AKI (acute kidney injury) ([place])  Acute upper GI bleed: Acute on chronic blood loss anemia: Patient presented after an episode of hematemesis at her nursing home. She remains hemodynamically stable.  Abdominal exam benign Hemoglobin 7.9 on arrival, dropped to 6.4. Patient is s/p 2 units of PRBC posttransfusion hemoglobin 7.4. Continue pantoprazole infusion. 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 GI consulted, Patient underwent EGD and found to have nonbleeding gastric ulcer treated. Started on clear liquid diet,  follow-up H&H.  Mild AKI: Likely prerenal in the setting of acute GI bleed. Baseline serum creatinine 0.8-0.9.  Creatinine on arrival 1.1> trended up  1.31 Continue IV gentle hydration, avoid nephrotoxic medications.  Hyperkalemia Resolved.  Hereditary hemorrhagic telangectasia: She follows with Dr. Annamaria Boots and remains on Avastin as needed due to high proteinuria. She is also on bevacizumab and receives iron infusion. Liver MRI done in December 2022 showing signs of iron deposits in the liver and spleen and marrow space.   Transfuse PRBC if hemoglobin less than 7.  Insulin-dependent type 2 diabetes: Continue sensitive regular insulin sliding scale Hemoglobin A1c 7.3.  Chronic diastolic CHF No signs of volume overload.   Echo done in 8/22 shows EF 65 to 70% grade 1 2 diastolic dysfunction.  Essential hypertension: Hold blood pressure medications in setting of acute GI bleed  Seizure disorder: Continue Keppra  Dementia: She remains stable.   Appears at her baseline.  No behavioral problems    DVT prophylaxis: SCDs Code Status: Full code Family Communication: No family at bedside Disposition Plan:   Status is: Inpatient Remains inpatient appropriate because: Admitted for acute upper GI bleed, GI consulted underwent EGD found to have nonbleeding gastric ulcer treatment completed.   Anticipated discharge home in 1 to 2 days.  Consultants:  Gastroenterology  Procedures: EGD Antimicrobials: None  Subjective: Patient was seen and examined at bedside.  Overnight events noted. Patient underwent EGD found to have nonbleeding gastric ulcer which was treated with cautery. Patient is started on clear liquid diet,  reports feeling better.  Objective: Vitals:   11/22/21 0815 11/22/21 0820 11/22/21 0825 11/22/21 0830  BP: (!) 177/44 (!) 162/73 (!) 151/55 (!) 169/54  Pulse:      Resp: '13 16 20 '$ (!) 23  Temp: 98.5 F (36.9 C)     TempSrc: Temporal     SpO2: 100% 98%  98% 98%  Weight:      Height:        Intake/Output Summary (Last 24 hours) at 11/22/2021 0952 Last data filed at 11/22/2021 0951 Gross per 24 hour  Intake  999.5 ml  Output 850 ml  Net 149.5 ml   Filed Weights   11/20/21 2309 11/21/21 0420 11/22/21 0724  Weight: 55 kg 58.3 kg 58.3 kg    Examination:  General exam: Appears comfortable, not in any acute distress.  Deconditioned Respiratory system: CTA bilaterally, respiratory effort normal, RR 15. Cardiovascular system: S1 & S2 heard, regular rate and rhythm, no murmur. Gastrointestinal system: Abdomen is soft, non tender, non distended, BS+ Central nervous system: Alert and oriented x 3. No focal neurological deficits. Extremities: No edema, no cyanosis, no clubbing. Skin: No rashes, lesions or ulcers Psychiatry: Judgement and insight appear normal. Mood & affect appropriate.     Data Reviewed: I have personally reviewed following labs and imaging studies  CBC: Recent Labs  Lab 11/20/21 2323 11/21/21 0306 11/21/21 0711 11/21/21 1052 11/21/21 1400 11/22/21 0505  WBC 7.7  --   --   --   --   --   NEUTROABS 5.4  --   --   --   --   --   HGB 7.9* 7.0* 6.5* 6.9* 7.5* 7.4*  HCT 26.2* 22.3* 21.0* 22.1* 22.9* 23.1*  MCV 101.2*  --   --   --   --   --   PLT 202  --   --   --   --   --    Basic Metabolic Panel: Recent Labs  Lab 11/20/21 2323 11/21/21 0306 11/22/21 0505  NA 145 145 146*  K 5.7* 4.8 3.7  CL 117* 116* 119*  CO2 '24 23 25  '$ GLUCOSE 270* 300* 114*  BUN 24* 31* 38*  CREATININE 1.12* 1.20* 1.31*  CALCIUM 8.1* 8.1* 8.2*  MG  --   --  2.0  PHOS  --   --  3.0   GFR: Estimated Creatinine Clearance: 27.9 mL/min (A) (by C-G formula based on SCr of 1.31 mg/dL (H)). Liver Function Tests: Recent Labs  Lab 11/20/21 2323 11/21/21 0306  AST 47* 24  ALT 16 15  ALKPHOS 112 103  BILITOT 0.7 0.7  PROT 4.7* 4.4*  ALBUMIN 2.3* 2.2*   No results for input(s): "LIPASE", "AMYLASE" in the last 168 hours. No results for input(s): "AMMONIA" in the last 168 hours. Coagulation Profile: Recent Labs  Lab 11/20/21 2323  INR 1.2   Cardiac Enzymes: No results for input(s):  "CKTOTAL", "CKMB", "CKMBINDEX", "TROPONINI" in the last 168 hours. BNP (last 3 results) No results for input(s): "PROBNP" in the last 8760 hours. HbA1C: No results for input(s): "HGBA1C" in the last 72 hours. CBG: Recent Labs  Lab 11/21/21 1132 11/21/21 1532 11/21/21 1955 11/22/21 0418 11/22/21 0948  GLUCAP 134* 113* 105* 106* 134*   Lipid Profile: No results for input(s): "CHOL", "HDL", "LDLCALC", "TRIG", "CHOLHDL", "LDLDIRECT" in the last 72 hours. Thyroid Function Tests: No results for input(s): "TSH", "T4TOTAL", "FREET4", "T3FREE", "THYROIDAB" in the last 72 hours. Anemia Panel: Recent Labs    11/22/21 0504  VITAMINB12 296  FOLATE 9.4   Sepsis Labs: No results for input(s): "PROCALCITON", "LATICACIDVEN" in the last 168 hours.  Recent Results (from the past 240 hour(s))  MRSA Next Gen by PCR, Nasal     Status: Abnormal   Collection Time: 11/21/21  3:51 AM   Specimen: Nasal Mucosa; Nasal Swab  Result Value Ref Range Status   MRSA by PCR Next Gen DETECTED (A) NOT DETECTED Final    Comment: (NOTE) The GeneXpert MRSA Assay (FDA approved for NASAL specimens only), is one component of a comprehensive MRSA colonization surveillance program. It is not intended to diagnose MRSA infection nor to guide or monitor treatment for MRSA infections. Test performance is not FDA approved in patients less than 4 years old. Performed at [facility] [place] [address] [place], [place] [address]     Radiology Studies: No results found.  Scheduled Meds:  Chlorhexidine Gluconate Cloth  6 each Topical Daily   insulin aspart  0-9 Units Subcutaneous Q4H   mupirocin ointment  1 Application Nasal BID   [START ON 11/24/2021] pantoprazole  40 mg Intravenous Q12H   sodium chloride flush  10-40 mL Intracatheter Q12H   Continuous Infusions:  pantoprazole 8 mg/hr (11/22/21 0217)     LOS: 1 day    Time spent: 50 mins    Javeon Macmurray, MD [facility]   If  7PM-7AM, please contact night-coverage

## 2021-11-22 NOTE — TOC Initial Note (Signed)
Transition of Care [facility]) - Initial/Assessment Note    Patient Details  Name: [name] MRN: [id number] Date of Birth: [birth year]  [facility] CM/SW Contact:    Lennart Pall, LCSW Phone Number: 11/22/2021, 11:45 AM  Clinical Narrative:                 Have spoken with pt's primary contact, niece, Irving Burton to confirm dc plans.  (Attempted to speak with pt, however, she is sleeping soundly following procedure).  Niece confirms that pt is a LTC resident at [facility] and [facility] will be for her to return.  Notes that pt's spouse with chronic health concerns and has HD 3x/wk and unable to provide adequate care for pt in the home.  Explained that TOC will assist with pt's return to SNF when medically ready.   Expected Discharge Plan: Long Term Nursing Home Barriers to Discharge: Continued Medical Work up   Patient Goals and CMS Choice Patient states their goals for this hospitalization and ongoing recovery are:: return to [place] [employer].gov Compare Post Acute Care list provided to:: Patient    Expected Discharge Plan and Services Expected Discharge Plan: [place] In-house Referral: Clinical Social Work Discharge Planning Services: CM Consult   Living arrangements for the past 2 months: [place]                 DME Arranged: N/A DME Agency: NA                  Prior Living Arrangements/Services Living arrangements for the past 2 months: [place] Lives with:: Facility Resident Patient language and need for interpreter reviewed:: Yes Do you feel safe going back to the place where you live?: Yes      Need for Family Participation in Patient Care: No (Comment) Care giver support system in place?: Yes (comment)   Criminal Activity/Legal Involvement Pertinent to Current Situation/Hospitalization: No - Comment as needed  Activities of Daily Living Home Assistive Devices/Equipment: Eyeglasses,  Cane (specify quad or straight) ADL Screening (condition at time of admission) Patient's cognitive ability adequate to safely complete daily activities?: Yes Is the patient deaf or have difficulty hearing?: No Does the patient have difficulty seeing, even when wearing glasses/contacts?: No Does the patient have difficulty concentrating, remembering, or making decisions?: No Patient able to express need for assistance with ADLs?: Yes Does the patient have difficulty dressing or bathing?: Yes Independently performs ADLs?: No Communication: Independent Dressing (OT): Needs assistance Is this a change from baseline?: Pre-admission baseline Grooming: Independent Feeding: Independent Bathing: Needs assistance Is this a change from baseline?: Pre-admission baseline Toileting: Needs assistance Is this a change from baseline?: Pre-admission baseline In/Out Bed: Needs assistance Is this a change from baseline?: Pre-admission baseline Walks in Home: Needs assistance Is this a change from baseline?: Pre-admission baseline Does the patient have difficulty walking or climbing stairs?: Yes Weakness of Legs: Both Weakness of Arms/Hands: Both  Permission Sought/Granted Permission sought to share information with : Family Supports, Chartered certified accountant granted to share information with : Yes, Verbal Permission Granted  Share Information with NAME: Irving Burton     Permission granted to share info w Relationship: niece  Permission granted to share info w Contact Information: [phone]  Emotional Assessment Appearance:: Appears stated age Attitude/Demeanor/Rapport: Unable to Assess Affect (typically observed): Unable to Assess Orientation: : Fluctuating Orientation (Suspected and/or reported Sundowners) Alcohol / Substance Use: Not Applicable Psych  Involvement: No (comment)  Admission diagnosis:  Prerenal azotemia [R79.89] Acute upper GI bleed [K92.2] Elevated AST  (SGOT) [R74.01] Hematemesis with nausea [K92.0] Patient Active Problem List   Diagnosis Date Noted   Acute upper GI bleed 11/21/2021   AKI (acute kidney injury) ([place]) 01/28/2021   GI bleed 01/18/2021   Bullous lesion 10/08/2020   Hearing loss 05/29/2020   Neuropathy 07/27/2019   Port-A-Cath in place 05/23/2018   Iron deficiency anemia due to chronic blood loss 04/19/2018   Acute right hip pain 03/10/2018   Hyperpigmented skin lesion 02/07/2018   Leg weakness, bilateral 12/23/2017   Pain localized to upper abdomen 10/05/2017   Lower extremity pain, bilateral 07/15/2017   Pulmonary HTN ([place]) 04/01/2017   Heart murmur 04/01/2017   Overgrown toenails 03/18/2017   Osteopenia after menopause 01/13/2017   Epistaxis 07/30/2016   Venous insufficiency of both lower extremities 04/27/2016   Acute on chronic blood loss anemia    Dry skin 02/17/2016   Peripheral vascular disease ([place]) 12/30/2015   Pain in joint of right shoulder 10/22/2015   PAF (paroxysmal atrial fibrillation) ([place]) 09/29/2015   Healthcare maintenance 06/20/2015   Seborrheic keratosis 06/20/2015   Dementia ([place]) 01/30/2015   Simple febrile convulsions ([place]) 10/21/2014   Hyperammonemia ([place]) 10/03/2014   CKD (chronic kidney disease), stage III ([name]    Seizures ([place]) 09/2014   Chronic diastolic CHF (congestive heart failure) ([place]) 10/03/2013   Acute on chronic heart failure with preserved ejection fraction (HFpEF) ([place]) 10/03/2013   Type 2 diabetes mellitus ([place]) 09/15/2012   Essential hypertension 03/02/2012   Fatigue 07/10/2011   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 [place] [address] Phone: [phone] Fax: [phone]     Social Determinants of Health (SDOH) Interventions    Readmission Risk Interventions    11/22/2021   11:30  AM  Readmission Risk Prevention Plan  Transportation Screening Complete  PCP or Specialist Appt within 5-7 Days Complete  Home Care Screening Complete  Medication Review (RN CM) Complete

## 2021-11-23 DIAGNOSIS — K922 Gastrointestinal hemorrhage, unspecified: Secondary | ICD-10-CM | POA: Diagnosis not present

## 2021-11-23 LAB — BASIC METABOLIC PANEL
Anion gap: 5 (ref 5–15)
BUN: 26 mg/dL — ABNORMAL HIGH (ref 8–23)
CO2: 24 mmol/L (ref 22–32)
Calcium: 8.4 mg/dL — ABNORMAL LOW (ref 8.9–10.3)
Chloride: 116 mmol/L — ABNORMAL HIGH (ref 98–111)
Creatinine, Ser: 1.18 mg/dL — ABNORMAL HIGH (ref 0.44–1.00)
GFR, Estimated: 46 mL/min — ABNORMAL LOW (ref >60)
Glucose, Bld: 130 mg/dL — ABNORMAL HIGH (ref 70–99)
Potassium: 3.4 mmol/L — ABNORMAL LOW (ref 3.5–5.1)
Sodium: 145 mmol/L (ref 135–145)

## 2021-11-23 LAB — GLUCOSE, CAPILLARY
Glucose-Capillary: 112 mg/dL — ABNORMAL HIGH (ref 70–99)
Glucose-Capillary: 129 mg/dL — ABNORMAL HIGH (ref 70–99)
Glucose-Capillary: 140 mg/dL — ABNORMAL HIGH (ref 70–99)

## 2021-11-23 LAB — CBC
HCT: 24.5 % — ABNORMAL LOW (ref 36.0–46.0)
Hemoglobin: 7.8 g/dL — ABNORMAL LOW (ref 12.0–15.0)
MCH: 30 pg (ref 26.0–34.0)
MCHC: 31.8 g/dL (ref 30.0–36.0)
MCV: 94.2 fL (ref 80.0–100.0)
Platelets: 139 10*3/uL — ABNORMAL LOW (ref 150–400)
RBC: 2.6 MIL/uL — ABNORMAL LOW (ref 3.87–5.11)
RDW: 13.8 % (ref 11.5–15.5)
WBC: 5.8 10*3/uL (ref 4.0–10.5)
nRBC: 0 % (ref 0.0–0.2)

## 2021-11-23 MED ORDER — POTASSIUM CHLORIDE 20 MEQ PO PACK
40.0000 meq | PACK | Freq: Once | ORAL | Status: AC
  Administered 2021-11-23: 40 meq via ORAL
  Filled 2021-11-23: qty 2

## 2021-11-23 MED ORDER — HEPARIN SOD (PORK) LOCK FLUSH 100 UNIT/ML IV SOLN: 500.0000 [IU] | INTRAVENOUS | Status: DC | PRN

## 2021-11-23 NOTE — Progress Notes (Signed)
[facility] picked at bedside to take patient. Port removed and PIV removed. AVS placed in discharge packet and given to [facility]. Patient asking about cellphone- on Admission assessment only charted belonging was hat. Explained this to patient and Case manager made niece aware. Family to follow up if phone not at facility per Case manager.

## 2021-11-23 NOTE — Discharge Instructions (Signed)
Advised to follow-up with primary care physician in 1 week. Advised to follow-up with gastroenterology in 4 weeks for repeat EGD to reassess gastric ulcer. Advised to take pantoprazole 40 mg twice daily.

## 2021-11-23 NOTE — TOC Transition Note (Addendum)
Transition of Care [facility]) - CM/SW Discharge Note   Patient Details  Name: [name] MRN: [id number] Date of Birth: Nov 10, 1940  [facility] CM/SW Contact:  Ross Ludwig, LCSW Phone Number: 11/23/2021, 2:26 PM   Clinical Narrative:     CSW spoke to [place] at [employer] for [facility].  She said patient can return back to SNF today.  Patient to be d/c'ed today to [facility] room 128B.  Patient and family agreeable to plans will transport via ems RN to call report to  [phone].  Patient's niece is aware of patient discharging today.     Final next level of care: [place] Barriers to Discharge: Barriers Resolved   Patient Goals and CMS Choice Patient states their goals for this hospitalization and ongoing recovery are:: To return back to [facility].gov Compare Post Acute Care list provided to:: Patient Choice offered to / list presented to : [place] / Guardian  Discharge Placement   Existing PASRR number confirmed : 11/23/21          Patient chooses bed at: Other - please specify in the comment section below: ([place]) Patient to be transferred to facility by: [facility] Name of family member notified: Niece [name] Patient and family notified of of transfer: 11/23/21  Discharge Plan and Services In-house Referral: Clinical Social Work Discharge Planning Services: CM Consult            DME Arranged: N/A DME Agency: NA                  Social Determinants of Health (SDOH) Interventions     Readmission Risk Interventions    11/22/2021   11:30 AM  Readmission Risk Prevention Plan  Transportation Screening Complete  PCP or Specialist Appt within 5-7 Days Complete  Home Care Screening Complete  Medication Review (RN CM) Complete

## 2021-11-23 NOTE — Progress Notes (Signed)
Tried to call [facility] to give report. No answer. I have attempted to call and give report 3 times. Charge nurse aware and Education officer, museum aware.

## 2021-11-23 NOTE — Discharge Summary (Addendum)
Physician Discharge Summary  [name] ERX:[id number] DOB: 1940-09-25 DOA: 11/20/2021  PCP: [name], MD  Admit date: 11/20/2021  Discharge date: 11/23/2021  Admitted From: [name] Disposition: [name]  Recommendations for Outpatient Follow-up:  Follow up with PCP in 1-2 weeks. Please obtain BMP/CBC in one week. Advised to follow-up with gastroenterology in 4 weeks for repeat EGD to reassess gastric ulcer. Advised to take pantoprazole 40 mg twice daily.  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  Patient presented for the evaluation of hematemesis. She was hemodynamically stable.  Patient was admitted for further evaluation.  Her hemoglobin has dropped to 6.5 requiring 2 units of blood transfusion. GI was consulted, Patient underwent EGD found to have nonbleeding gastric ulcer.  Patient was continued on IV Protonix.  Patient is started on clear liquid diet , advanced to soft bland diet,  tolerated well.  Hemoglobin remained stable afterwards.  Patient is cleared from GI to be discharged.  Patient will need follow-up with GI in 4 weeks for repeat EGD to reassess healing of gastric ulcer. Patient wants to be discharged.  Patient is being discharged back to [name] skilled nursing facility.  Discharge Diagnoses:  Principal Problem:   Acute upper  GI bleed Active Problems:   Type 2 diabetes mellitus ([name]   HHT (hereditary hemorrhagic telangiectasia) ([name]   Chronic diastolic CHF (congestive heart failure) ([name]   Essential hypertension   Dementia ([name]   Acute on chronic blood loss anemia   AKI (acute kidney injury) ([place])  Acute upper GI bleed: Acute on chronic blood loss anemia: Patient presented after an episode of hematemesis at her nursing home. She remains hemodynamically stable.  Abdominal exam benign Hemoglobin 7.9 on arrival, dropped to 6.4. Patient is s/p 2 units of PRBC posttransfusion hemoglobin 7.4. Continue pantoprazole infusion. 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 GI consulted, Patient underwent EGD and found to have nonbleeding gastric ulcer treated. Started on clear liquid diet, advance to soft bland diet. H&H remains stable.  Cleared from GI to be discharged.  Patient is being discharged   Mild AKI: Likely prerenal in the setting of acute GI bleed. Baseline serum creatinine 0.8-0.9.  Creatinine on arrival 1.1> trended up 1.31 Continue IV gentle hydration, avoid nephrotoxic medications. Renal functions improved  and back to baseline.   Hyperkalemia Resolved.   Hereditary hemorrhagic telangectasia: She follows with Dr. Annamaria Boots and remains on Avastin as needed due to high proteinuria. She is also on bevacizumab and receives iron infusion. Liver MRI done in December 2022 showing signs of iron deposits in the liver and spleen and marrow space.   Transfuse PRBC if hemoglobin less than 7.   Insulin-dependent type 2 diabetes: Continue sensitive regular insulin sliding scale. Hemoglobin A1c 7.3.   Chronic diastolic CHF No signs of volume overload.   Echo done in 8/22 shows EF 65 to 70% grade 1 2 diastolic dysfunction.   Essential hypertension: Hold blood pressure medications  in setting of acute GI bleed. Resume blood pressure medications.   Seizure  disorder: Continue Keppra   Dementia: She remains stable.   Appears at her baseline.  No behavioral problems    Discharge Instructions  Discharge Instructions     Call MD for:  difficulty breathing, headache or visual disturbances   Complete by: As directed    Call MD for:  persistant dizziness or light-headedness   Complete by: As directed    Call MD for:  persistant nausea and vomiting   Complete by: As directed    Diet - low sodium heart healthy   Complete by: As directed    Diet Carb Modified   Complete by: As directed    Discharge instructions   Complete by: As directed    Advised to follow-up with primary care physician in 1 week. Advised to follow-up with gastroenterology in 4 weeks for repeat EGD to reassess gastric ulcer. Advised to take pantoprazole 40 mg twice daily.   Increase activity slowly   Complete by: As directed       Allergies as of 11/23/2021       Reactions   Aspirin Nausea And Vomiting        Medication List     STOP taking these medications    lisinopril 5 MG tablet Commonly known as: ZESTRIL   traMADol 50 MG tablet Commonly known as: ULTRAM       TAKE these medications    amLODipine 5 MG tablet Commonly known as: NORVASC Take 1 tablet (5 mg total) by mouth daily.   bisacodyl 10 MG suppository Commonly known as: DULCOLAX Place 10 mg rectally as needed for moderate constipation.   diclofenac Sodium 1 % Gel Commonly known as: VOLTAREN Apply 2 g topically 4 (four) times daily.   gabapentin 100 MG capsule Commonly known as: NEURONTIN Take 1 capsule (100 mg total) by mouth 2 (two) times daily.   guaiFENesin 100 MG/5ML liquid Commonly known as: ROBITUSSIN Take 200 mg by mouth every 4 (four) hours as needed for cough.   IMMUNE FORMULA PO Take 1 tablet by mouth daily.   insulin lispro 100 UNIT/ML KwikPen Commonly known as: HumaLOG KwikPen Inject 3 Units into the skin 3 (three) times daily. What changed:  how much to  take when to take this additional instructions   lactulose 10 GM/15ML solution Commonly known as: CHRONULAC Take 15 mLs by mouth in the morning, at noon, and at bedtime.   Lantus SoloStar 100 UNIT/ML Solostar Pen Generic drug: insulin glargine Inject 10 Units into the skin at bedtime. What changed:  how much to take when to take this   levETIRAcetam 500 MG tablet Commonly known as: KEPPRA Take 1 tablet (500 mg total) by mouth 2 (two) times daily.   memantine 10 MG tablet Commonly known as: NAMENDA TAKE 1 TABLET BY MOUTH TWICE DAILY   MILK OF MAGNESIA PO Take 30 mLs by mouth every evening.   mirtazapine 7.5 MG tablet Commonly known as: REMERON Take 7.5 mg by mouth at bedtime.   Multi For Her 50+ Tabs Take 1 tablet by mouth daily with breakfast.   pantoprazole 40 MG tablet Commonly known as: PROTONIX Take 1 tablet (40 mg total) by mouth 2 (two) times daily.   torsemide 20 MG tablet Commonly known as: DEMADEX Take 1 tablet (20 mg total) by mouth as needed (please take 1 '20mg'$  tablet as needed for weight gain of 2 pounds overnight or 5 pounds in a week).  triamcinolone cream 0.1 % Commonly known as: KENALOG Apply 1 Application topically 2 (two) times daily.   Trulicity 3 JJ/0.0XF Sopn Generic drug: Dulaglutide Inject 3 mg into the skin once a week. What changed: additional instructions        Follow-up Information     [name], MD Follow up in 1 week(s).   Contact information: [place] [address] [phone]         Gastroenterology, [facility] Follow up in 4 week(s).   Contact information: [address] [place] [address] [phone]                Allergies  Allergen Reactions   Aspirin Nausea And Vomiting    Consultations: Gastroenterology   Procedures/Studies: ECHOCARDIOGRAM COMPLETE  Result Date: 11/22/2021    ECHOCARDIOGRAM REPORT   Patient Name:   [name] Date of Exam: 11/22/2021  Medical Rec #:  [id number]      Height:       63.0 in Accession #:    [id number]     Weight:       128.5 lb Date of Birth:  June 04, 1940       BSA:          1.602 m Patient Age:    81 years       BP:           156/59 mmHg Patient Gender: F              HR:           74 bpm. Exam Location:  Inpatient Procedure: 2D Echo, Color Doppler and Cardiac Doppler Indications:    CHF/Murmur  History:        Patient has prior history of Echocardiogram examinations, most                 recent 09/29/2020. Pulmonary HTN, AS; Risk Factors:Diabetes,                 Hypertension and Former Smoker.  Sonographer:    Eartha Inch Referring Phys: [id number] [place]  Sonographer Comments: Technically challenging study due to limited acoustic windows. Image acquisition challenging due to patient body habitus and Image acquisition challenging due to uncooperative patient. IMPRESSIONS  1. Study truncated due to patient cooperation. Consider repeat echocardiogram per patient preference.  2. Left ventricular ejection fraction, by estimation, is 70 to 75%. The left ventricle has hyperdynamic function. The left ventricle has no regional wall motion abnormalities. There is mild left ventricular hypertrophy. Left ventricular diastolic function could not be evaluated.  3. Right ventricular systolic function is mildly reduced. The right ventricular size is normal. IVC not imaged.  4. A small pericardial effusion is present.  5. The mitral valve is grossly normal. Trivial mitral valve regurgitation in parasternal view. MV stenosis not assessed.  6. The aortic valve is grossly normal. There is mild calcification of the aortic valve. Aortic valve regurgitation is not visualized in parasternal view. AV stenosis not assessed. FINDINGS  Left Ventricle: Left ventricular ejection fraction, by estimation, is 70 to 75%. The left ventricle has hyperdynamic function. The left ventricle has no regional wall motion abnormalities. The left ventricular  internal cavity size was normal in size. There is mild left ventricular hypertrophy. Left ventricular diastolic function could not be evaluated. Right Ventricle: The right ventricular size is normal. No increase in right ventricular wall thickness. Right ventricular systolic function is mildly reduced. Left Atrium: Left atrial  size was normal in size. Right Atrium: Right atrial size was normal in size. Pericardium: A small pericardial effusion is present. Presence of epicardial fat layer. Mitral Valve: The mitral valve is grossly normal. Trivial mitral valve regurgitation. Tricuspid Valve: The tricuspid valve is grossly normal. Tricuspid valve regurgitation is trivial. No evidence of tricuspid stenosis. Aortic Valve: The aortic valve is grossly normal. There is mild calcification of the aortic valve. Aortic valve regurgitation is not visualized. Pulmonic Valve: The pulmonic valve was grossly normal. Pulmonic valve regurgitation is trivial. No evidence of pulmonic stenosis. Aorta: The aortic root is normal in size and structure. IAS/Shunts: The interatrial septum was not assessed.  LEFT VENTRICLE PLAX 2D LVIDd:         4.10 cm LVIDs:         2.30 cm LV PW:         1.20 cm LV IVS:        1.10 cm LVOT diam:     1.70 cm LVOT Area:     2.27 cm  LEFT ATRIUM         Index LA diam:    4.30 cm 2.68 cm/m                        PULMONIC VALVE AORTA                 PV Vmax:       1.89 m/s Ao Root diam: 2.50 cm PV Vmean:      125.010 cm/s Ao Asc diam:  3.20 cm PV VTI:        0.426 m                       PV Peak grad:  14.2 mmHg                       PV Mean grad:  7.2 mmHg  TRICUSPID VALVE TR Peak grad:   25.2 mmHg TR Vmax:        251.00 cm/s  SHUNTS Systemic Diam: 1.70 cm Cherlynn Kaiser MD Electronically signed by Cherlynn Kaiser MD Signature Date/Time: 11/22/2021/12:05:55 PM    Final     EGD   Subjective: Patient was seen and examined at bedside.  Overnight events noted.   Patient reports feeling better H&H remains  stable. She tolerated soft bland diet,  Patient being discharged back to skilled nursing facility.  Discharge Exam: Vitals:   11/23/21 0900 11/23/21 1000  BP: (!) 137/53 (!) 143/52  Pulse:    Resp: 18 15  Temp:    SpO2:     Vitals:   11/23/21 0755 11/23/21 0800 11/23/21 0900 11/23/21 1000  BP:  (!) 139/46 (!) 137/53 (!) 143/52  Pulse:  68    Resp:  '17 18 15  '$ Temp: 98 F (36.7 C)     TempSrc: Oral     SpO2:  99%    Weight:      Height:        General: Pt is alert, awake, not in acute distress Cardiovascular: RRR, S1/S2 +, no rubs, no gallops Respiratory: CTA bilaterally, no wheezing, no rhonchi Abdominal: Soft, NT, ND, bowel sounds + Extremities: no edema, no cyanosis    The results of significant diagnostics from this hospitalization (including imaging, microbiology, ancillary and laboratory) are listed below for reference.     Microbiology: Recent Results (from the past 240 hour(s))  MRSA  Next Gen by PCR, Nasal     Status: Abnormal   Collection Time: 11/21/21  3:51 AM   Specimen: Nasal Mucosa; Nasal Swab  Result Value Ref Range Status   MRSA by PCR Next Gen DETECTED (A) NOT DETECTED Final    Comment: (NOTE) The GeneXpert MRSA Assay (FDA approved for NASAL specimens only), is one component of a comprehensive MRSA colonization surveillance program. It is not intended to diagnose MRSA infection nor to guide or monitor treatment for MRSA infections. Test performance is not FDA approved in patients less than 35 years old. Performed at [facility], [place] [address] [place], [place] [address]      Labs: BNP (last 3 results) Recent Labs    11/21/21 0306  BNP 604.5*   Basic Metabolic Panel: Recent Labs  Lab 11/20/21 2323 11/21/21 0306 11/22/21 0505 11/23/21 0528  NA 145 145 146* 145  K 5.7* 4.8 3.7 3.4*  CL 117* 116* 119* 116*  CO2 '24 23 25 24  '$ GLUCOSE 270* 300* 114* 130*  BUN 24* 31* 38* 26*  CREATININE 1.12* 1.20* 1.31* 1.18*   CALCIUM 8.1* 8.1* 8.2* 8.4*  MG  --   --  2.0  --   PHOS  --   --  3.0  --    Liver Function Tests: Recent Labs  Lab 11/20/21 2323 11/21/21 0306  AST 47* 24  ALT 16 15  ALKPHOS 112 103  BILITOT 0.7 0.7  PROT 4.7* 4.4*  ALBUMIN 2.3* 2.2*   No results for input(s): "LIPASE", "AMYLASE" in the last 168 hours. No results for input(s): "AMMONIA" in the last 168 hours. CBC: Recent Labs  Lab 11/20/21 2323 11/21/21 0306 11/21/21 1052 11/21/21 1400 11/22/21 0505 11/22/21 1309 11/23/21 0528  WBC 7.7  --   --   --   --   --  5.8  NEUTROABS 5.4  --   --   --   --   --   --   HGB 7.9*   < > 6.9* 7.5* 7.4* 7.5* 7.8*  HCT 26.2*   < > 22.1* 22.9* 23.1* 23.8* 24.5*  MCV 101.2*  --   --   --   --   --  94.2  PLT 202  --   --   --   --   --  139*   < > = values in this interval not displayed.   Cardiac Enzymes: No results for input(s): "CKTOTAL", "CKMB", "CKMBINDEX", "TROPONINI" in the last 168 hours. BNP: Invalid input(s): "POCBNP" CBG: Recent Labs  Lab 11/22/21 1941 11/22/21 2314 11/23/21 0451 11/23/21 0818 11/23/21 1149  GLUCAP 179* 129* 112* 129* 140*   D-Dimer No results for input(s): "DDIMER" in the last 72 hours. Hgb A1c No results for input(s): "HGBA1C" in the last 72 hours. Lipid Profile No results for input(s): "CHOL", "HDL", "LDLCALC", "TRIG", "CHOLHDL", "LDLDIRECT" in the last 72 hours. Thyroid function studies No results for input(s): "TSH", "T4TOTAL", "T3FREE", "THYROIDAB" in the last 72 hours.  Invalid input(s): "FREET3" Anemia work up Recent Labs    11/22/21 0504  VITAMINB12 296  FOLATE 9.4   Urinalysis    Component Value Date/Time   COLORURINE YELLOW 03/03/2021 0110   APPEARANCEUR CLEAR 03/03/2021 0110   LABSPEC 1.011 03/03/2021 0110   LABSPEC 1.025 10/19/2006 1254   PHURINE 6.0 03/03/2021 0110   GLUCOSEU 150 (A) 03/03/2021 0110   HGBUR NEGATIVE 03/03/2021 0110   BILIRUBINUR NEGATIVE 03/03/2021 0110   BILIRUBINUR neg 05/21/2016 1605  BILIRUBINUR Negative 10/19/2006 1254   KETONESUR NEGATIVE 03/03/2021 0110   PROTEINUR 30 (A) 09/17/2021 1242   UROBILINOGEN 0.2 05/21/2016 1605   UROBILINOGEN 0.2 10/02/2014 2307   NITRITE NEGATIVE 03/03/2021 0110   LEUKOCYTESUR NEGATIVE 03/03/2021 0110   LEUKOCYTESUR Negative 10/19/2006 1254   Sepsis Labs Recent Labs  Lab 11/20/21 2323 11/23/21 0528  WBC 7.7 5.8   Microbiology Recent Results (from the past 240 hour(s))  MRSA Next Gen by PCR, Nasal     Status: Abnormal   Collection Time: 11/21/21  3:51 AM   Specimen: Nasal Mucosa; Nasal Swab  Result Value Ref Range Status   MRSA by PCR Next Gen DETECTED (A) NOT DETECTED Final    Comment: (NOTE) The GeneXpert MRSA Assay (FDA approved for NASAL specimens only), is one component of a comprehensive MRSA colonization surveillance program. It is not intended to diagnose MRSA infection nor to guide or monitor treatment for MRSA infections. Test performance is not FDA approved in patients less than 42 years old. Performed at [facility], [place] [address], [place], [place] [address]      Time coordinating discharge: Over 30 minutes  SIGNED:   Shawna Clamp, MD  [facility] 11/23/2021, 12:16 PM Pager   If 7PM-7AM, please contact night-coverage

## 2021-11-23 NOTE — TOC Progression Note (Addendum)
Transition of Care [facility]) - Progression Note    Patient Details  Name: [name] MRN: [id number] Date of Birth: 1940/12/31  [facility] CM/SW Contact  Ross Ludwig,  Phone Number: 11/23/2021, 10:29 AM  Clinical Narrative:     CSW attempted to reach [facility] worker, left a message awaiting a call back.  10:35pm  CSW spoke to Micheline Chapman from [facility], she said patient can return today to SNF.  Once patient is medically ready she will discharge back to room [address]B.  Expected Discharge Plan: Long Term Nursing Home Barriers to Discharge: Continued Medical Work up  Expected Discharge Plan and Services Expected Discharge Plan: [place] In-house Referral: Clinical Social Work Discharge Planning Services: CM Consult   Living arrangements for the past 2 months: [place]                 DME Arranged: N/A DME Agency: NA                   Social Determinants of Health (SDOH) Interventions    Readmission Risk Interventions    11/22/2021   11:30 AM  Readmission Risk Prevention Plan  Transportation Screening Complete  PCP or Specialist Appt within 5-7 Days Complete  Home Care Screening Complete  Medication Review (RN CM) Complete

## 2021-11-23 NOTE — Evaluation (Signed)
Physical Therapy Evaluation Patient Details Name: [name] MRN: [id number] DOB: [birth year] Today's Date: 11/23/2021  History of Present Illness  Patient is a 81 year old female who presented with hematemesis. patient was found to have low hgb of 6.5 requiring 2 units of PRBCs. patient underwent EGD on 10/14 and found to have nonbleeding gastric ulcer. 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 Patient is a long term care resident at SNF. Unclear about baseline for mobility as family is not in room and patient is noted to have some confusion during session. Patient needed STEDY to transfer from edge of bed to chair in room with increased reports of pain in bilateral knees with attempts at standing.  Patient was noted to have decreased functional activity tolerance, decreased ROM, generalized weakness, decreased endurance, decreased sitting balance, decreased standing balance, and decreased safety awareness limiting functional mobility. Patient would continue to benefit from skilled PT services at this time while admitted and after d/c to address noted deficits in order to improve overall safety and independence with all basic mobility tasks.    Recommendations for follow up therapy are one component of a multi-disciplinary discharge planning process, led by the attending physician.  Recommendations may be updated based on patient status, additional functional criteria and insurance authorization.  Follow Up Recommendations Skilled nursing-short term rehab (<3 hours/day) Can patient physically be transported by private vehicle: No    Assistance Recommended at Discharge Frequent or constant Supervision/Assistance  Patient can return home with the  following  Two people to help with walking and/or transfers;A [place] help with bathing/dressing/bathroom;Assistance with cooking/housework;Assist for transportation;Help with stairs or ramp for entrance    Equipment Recommendations None recommended by PT  Recommendations for Other Services       Functional Status Assessment Patient has had a recent decline in their functional status and demonstrates the ability to make significant improvements in function in a reasonable and predictable amount of time.     Precautions / Restrictions Precautions Precautions: Fall Restrictions Weight Bearing Restrictions: No      Mobility  Bed Mobility Overal bed mobility: Needs Assistance Bed Mobility: Supine to Sit     Supine to sit: Mod assist, +2 for safety/equipment, +2 for physical assistance     General bed mobility comments: with increased physical assist to scoot to the edge of bed.    Transfers Overall transfer level: Needs assistance Equipment used: Rolling walker (2 wheels) Transfers: Sit to/from Stand, Bed to chair/wheelchair/BSC Sit to Stand: Mod assist, +2 physical assistance, From elevated surface           General transfer comment: cues for use of UEs to self assist;  Physical assist to bring wt up and fwd and balance in standing with RW or Stedy Transfer via Lift Equipment: Stedy  Ambulation/Gait               General Gait Details: Pt stood only; unable to step  Financial trader Rankin (Stroke Patients Only)       Balance Overall balance assessment: Needs assistance Sitting-balance support: Feet supported, Bilateral upper extremity supported Sitting balance-Leahy Scale: Poor Sitting balance - Comments: needed min A for sitting balance   Standing balance support: Reliant  on assistive device for balance, Bilateral upper extremity supported Standing balance-Leahy Scale: Zero                                Pertinent Vitals/Pain Pain Assessment Pain Assessment: Faces Faces Pain Scale: Hurts even more Pain Location: bilateral knees when attempting to stand Pain Descriptors / Indicators: Moaning, Grimacing Pain Intervention(s): Limited activity within patient's tolerance, Monitored during session    Home Living Family/patient expects to be discharged to:: Skilled nursing facility                   Additional Comments: per chart    Prior Function Prior Level of Function : Needs assist;Patient poor historian/Family not available             Mobility Comments: Pt states ambulates with RW ADLs Comments: Needs assist with bathing/dressing. unclear prior level with patient able to provide some information but no family in room to verify.     Hand Dominance   Dominant Hand: Right    Extremity/Trunk Assessment   Upper Extremity Assessment Upper Extremity Assessment: Generalized weakness    Lower Extremity Assessment Lower Extremity Assessment: Generalized weakness;RLE deficits/detail;LLE deficits/detail RLE Deficits / Details: Bil DF limited to neutral; bil knees -20 degrees extension LLE Deficits / Details: as R LE    Cervical / Trunk Assessment Cervical / Trunk Assessment: Kyphotic  Communication   Communication: No difficulties  Cognition Arousal/Alertness: Awake/alert Behavior During Therapy: WFL for tasks assessed/performed Overall Cognitive Status: Within Functional Limits for tasks assessed                                 General Comments: patient noted to have some confusion during session but was over  all very appropriate and pleasant        General Comments      Exercises     Assessment/Plan    PT Assessment Patient needs continued PT services  PT Problem List Decreased strength;Decreased range of motion;Decreased activity tolerance;Decreased balance;Decreased mobility;Decreased knowledge of use of DME;Pain       PT  Treatment Interventions DME instruction;Gait training;Functional mobility training;Therapeutic activities;Therapeutic exercise;Balance training;Patient/family education    PT Goals (Current goals can be found in the Care Plan section)  Acute Rehab PT Goals Patient Stated Goal: No specific goals expressed PT Goal Formulation: With patient Time For Goal Achievement: 12/07/21 Potential to Achieve Goals: Fair    Frequency Min 2X/week     Co-evaluation PT/OT/SLP Co-Evaluation/Treatment: Yes Reason for Co-Treatment: For patient/therapist safety PT goals addressed during session: Mobility/safety with mobility OT goals addressed during session: ADL's and self-care       AM-PAC PT "6 Clicks" Mobility  Outcome Measure Help needed turning from your back to your side while in a flat bed without using bedrails?: A Lot Help needed moving from lying on your back to sitting on the side of a flat bed without using bedrails?: A Lot Help needed moving to and from a bed to a chair (including a wheelchair)?: A Lot Help needed standing up from a chair using your arms (e.g., wheelchair or bedside chair)?: A Lot Help needed to walk in hospital room?: Total Help needed climbing 3-5 steps with a railing? : Total 6 Click Score: 10    End of Session Equipment Utilized During Treatment: Gait belt Activity Tolerance: Patient limited by fatigue;Patient limited by  pain Patient left: in chair;with call bell/phone within reach;with chair alarm set Nurse Communication: Mobility status PT Visit Diagnosis: Unsteadiness on feet (R26.81);Muscle weakness (generalized) (M62.81);Difficulty in walking, not elsewhere classified (R26.2);Pain Pain - part of body: Knee    Time: 1045-1105 PT Time Calculation (min) (ACUTE ONLY): 20 min   Charges:   PT Evaluation $PT Eval Low Complexity: 1 Low          [place] Pager [phone] Office  [phone]   Ahmani Prehn 11/23/2021, 1:40 PM

## 2021-11-23 NOTE — [place] FL2 (Signed)
[place] MEDICAID FL2 LEVEL OF CARE SCREENING TOOL     IDENTIFICATION  Patient Name: [name] Birthdate: 1940/04/11 Sex: female Admission Date (Current Location): 11/20/2021  [place] and [place] Number:  Kathleen Argue [id number] [place] and Address:  [facility],  [place], [place]      Provider Number: [id number]  Attending Physician Name and Address:  Shawna Clamp, MD  Relative Name and Phone Number:  Irving Burton Niece   [phone]  [place], [place] [phone]  [phone]    Current Level of Care: Hospital Recommended Level of Care: [place] Prior Approval Number:    Date Approved/Denied:   PASRR Number: [id number]  Discharge Plan: SNF    Current Diagnoses: Patient Active Problem List   Diagnosis Date Noted   Acute upper GI bleed 11/21/2021   AKI (acute kidney injury) ([place]) 01/28/2021   GI bleed 01/18/2021   Bullous lesion 10/08/2020   Hearing loss 05/29/2020   Neuropathy 07/27/2019   Port-A-Cath in place 05/23/2018   Iron deficiency anemia due to chronic blood loss 04/19/2018   Acute right hip pain 03/10/2018   Hyperpigmented skin lesion 02/07/2018   Leg weakness, bilateral 12/23/2017   Pain localized to upper abdomen 10/05/2017   Lower extremity pain, bilateral 07/15/2017   Pulmonary HTN ([place]) 04/01/2017   Heart murmur 04/01/2017   Overgrown toenails 03/18/2017   Osteopenia after menopause 01/13/2017   Epistaxis 07/30/2016   Venous insufficiency of both lower extremities 04/27/2016   Acute on chronic blood loss anemia    Dry skin 02/17/2016   Peripheral vascular disease ([place]) 12/30/2015   Pain in joint of right shoulder 10/22/2015   PAF (paroxysmal atrial fibrillation) ([place]) 09/29/2015   Healthcare maintenance 06/20/2015   Seborrheic keratosis 06/20/2015   Dementia ([place]) 01/30/2015   Simple febrile convulsions ([place]) 10/21/2014   Hyperammonemia ([place]) 10/03/2014   CKD (chronic kidney  disease), stage III ([name]    Seizures ([name] 09/2014   Chronic diastolic CHF (congestive heart failure) ([place]) 10/03/2013   Acute on chronic heart failure with preserved ejection fraction (HFpEF) ([place]) 10/03/2013   Type 2 diabetes mellitus ([place]) 09/15/2012   Essential hypertension 03/02/2012   Fatigue 07/10/2011   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   HHT (hereditary hemorrhagic telangiectasia) ([place]) 02/01/2011    Orientation RESPIRATION BLADDER Height & Weight     Time, Situation, Place, Self  Normal Incontinent Weight: 128 lb 8.5 oz (58.3 kg) Height:  '5\' 3"'$  (160 cm)  BEHAVIORAL SYMPTOMS/MOOD NEUROLOGICAL BOWEL NUTRITION STATUS      Continent Diet  AMBULATORY STATUS COMMUNICATION OF NEEDS Skin   Limited Assist Verbally Normal                       Personal Care Assistance Level of Assistance  Bathing, Feeding, Dressing Bathing Assistance: Limited assistance Feeding assistance: Independent Dressing Assistance: Limited assistance     Functional Limitations Info  Sight, Hearing, Speech Sight Info: Adequate Hearing Info: Adequate Speech Info: Adequate    SPECIAL CARE FACTORS FREQUENCY                       Contractures Contractures Info: Not present    Additional Factors Info  Code Status, Allergies Code Status Info: Full Code Allergies Info: Aspirin           Current Medications (11/23/2021):  This is the current hospital active medication list Current Facility-Administered Medications  Medication Dose Route Frequency Provider Last Rate Last Admin  acetaminophen (TYLENOL) tablet 650 mg  650 mg Oral Q6H PRN Loney Laurence, DO   650 mg at 11/23/21 0930   Or   acetaminophen (TYLENOL) suppository 650 mg  650 mg Rectal Q6H PRN Loney Laurence, DO       Chlorhexidine Gluconate Cloth 2 % PADS 6 each  6 each Topical Daily Loney Laurence, DO   6 each at 11/22/21 1056   insulin aspart (novoLOG) injection 0-9 Units  0-9 Units Subcutaneous Q4H Danton Clap H,  DO   1 Units at 11/23/21 1159   levETIRAcetam (KEPPRA) tablet 500 mg  500 mg Oral BID Shawna Clamp, MD   500 mg at 11/23/21 0931   mupirocin ointment (BACTROBAN) 2 % 1 Application  1 Application Nasal BID Loney Laurence, DO   1 Application at 62/83/15 0930   Oral care mouth rinse  15 mL Mouth Rinse PRN Loney Laurence, DO       [START ON 11/24/2021] pantoprazole (PROTONIX) injection 40 mg  40 mg Intravenous Q12H [place], Claire H, DO       pantoprozole (PROTONIX) 80 mg /NS 100 mL infusion  8 mg/hr Intravenous Continuous Danton Clap H, DO 10 mL/hr at 11/23/21 1200 8 mg/hr at 11/23/21 1200   sodium chloride flush (NS) 0.9 % injection 10-40 mL  10-40 mL Intracatheter Q12H Danton Clap H, DO   10 mL at 11/23/21 0931   sodium chloride flush (NS) 0.9 % injection 10-40 mL  10-40 mL Intracatheter PRN Loney Laurence, DO       Facility-Administered Medications Ordered in Other Encounters  Medication Dose Route Frequency Provider Last Rate Last Admin   sodium chloride 0.9 % injection 10 mL  10 mL Intracatheter PRN Truitt Merle, MD       sodium chloride flush (NS) 0.9 % injection 10 mL  10 mL Intracatheter PRN Truitt Merle, MD   10 mL at 08/18/19 1717     Discharge Medications: Please see discharge summary for a list of discharge medications.  Relevant Imaging Results:  Relevant Lab Results:   Additional Information SSN: [id number]  Ross Ludwig, LCSW

## 2021-11-23 NOTE — Progress Notes (Signed)
[facility] Progress Note  SUBJECTIVE:   Interval history: [name] was seen and evaluated today at bedside.  Resting comfortably in bed.  Denies any abdominal pain, no nausea or vomiting.  No chest pain or shortness of breath.  Wishes that she can go home.  Hemoglobin trend noted today, hemoglobin 7.8 today.  Past Medical History:  Diagnosis Date   Chronic anemia    Chronic diastolic CHF (congestive heart failure) ([place]) 10/03/2013   Chronic GI bleeding    /notes 11/29/2014   Family history of anesthesia complication    "niece has a hard time coming out" (09/15/2012)   Frequent nosebleeds    chronic   Gastric AV malformation    /notes 11/29/2014   GERD (gastroesophageal reflux disease)    Heart murmur 04/01/2017   Moderate AVSC on echo 09/2016   Hematemesis 12/04/2017   History of blood transfusion "several"   History of epistaxis    HTN (hypertension), benign 03/02/2012   Hyperlipidemia    Iron deficiency anemia    chronic infusions"   Lichen planus    Both lower extremities   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   Past Surgical History:  Procedure Laterality Date   CATARACT EXTRACTION     "I think it was just one eye"   ESOPHAGOGASTRODUODENOSCOPY  02/26/2011   Procedure: ESOPHAGOGASTRODUODENOSCOPY (EGD);  Surgeon: [name];  [facility];  Service: Endoscopy;  Laterality: N/A;   ESOPHAGOGASTRODUODENOSCOPY N/A 11/08/2012   Procedure: ESOPHAGOGASTRODUODENOSCOPY (EGD);  Surgeon: [name];  [facility];  Service: Endoscopy;  Laterality: N/A;   ESOPHAGOGASTRODUODENOSCOPY N/A 10/04/2013   Procedure: ESOPHAGOGASTRODUODENOSCOPY  (EGD);  Surgeon: [name];  [facility];  Service: Endoscopy;  Laterality: N/A;  with APC on stand-by   ESOPHAGOGASTRODUODENOSCOPY N/A 07/06/2014   Procedure: ESOPHAGOGASTRODUODENOSCOPY (EGD);  Surgeon: [name];  [facility];  Service: Endoscopy;  Laterality: N/A;   ESOPHAGOGASTRODUODENOSCOPY N/A 09/05/2014   Procedure: ESOPHAGOGASTRODUODENOSCOPY (EGD);  Surgeon: JaLaurence SpatesMD;  [facility];  Service: Endoscopy;  Laterality: N/A;  APC on standby to control bleeding   ESOPHAGOGASTRODUODENOSCOPY N/A 11/29/2014   Procedure: ESOPHAGOGASTRODUODENOSCOPY (EGD);  Surgeon: [name];  [facility];  Service: Endoscopy;  Laterality: N/A;   ESOPHAGOGASTRODUODENOSCOPY N/A 09/28/2015   Procedure: ESOPHAGOGASTRODUODENOSCOPY (EGD);  Surgeon: [name];  [facility];  Service: Endoscopy;  Laterality: N/A;   ESOPHAGOGASTRODUODENOSCOPY (EGD) WITH PROPOFOL N/A 12/04/2017   Procedure: ESOPHAGOGASTRODUODENOSCOPY (EGD) WITH PROPOFOL;  Surgeon: [name];  [facility] Service: Endoscopy;  Laterality: N/A;   ESOPHAGOGASTRODUODENOSCOPY (EGD) WITH PROPOFOL N/A 12/24/2020   Procedure: ESOPHAGOGASTRODUODENOSCOPY (EGD) WITH PROPOFOL;  Surgeon: OuArta SilenceMD;  [place] [facility] Service: Endoscopy;  Laterality: N/A;   ESOPHAGOGASTRODUODENOSCOPY (EGD) WITH PROPOFOL N/A 01/19/2021   Procedure: ESOPHAGOGASTRODUODENOSCOPY (EGD) WITH PROPOFOL;  Surgeon: [name];  [place] [facility] Service: Endoscopy;  Laterality: N/A;   ESOPHAGOGASTRODUODENOSCOPY (EGD) WITH PROPOFOL N/A 02/06/2021   Procedure: ESOPHAGOGASTRODUODENOSCOPY (EGD) WITH PROPOFOL;  Surgeon: KaRonnette JuniperMD;  [place] [facility]: Gastroenterology;  Laterality: N/A;   ESOPHAGOGASTRODUODENOSCOPY ENDOSCOPY  08/19/2006   with laser treatment   HEMOSTASIS CLIP PLACEMENT  12/24/2020   Procedure: HEMOSTASIS CLIP  PLACEMENT;  Surgeon: Arta Silence, MD;  Location: [facility] ENDOSCOPY;  Service: Endoscopy;;   HEMOSTASIS CLIP PLACEMENT  01/19/2021   Procedure: HEMOSTASIS CLIP PLACEMENT;  Surgeon: [name], MD;  Location: [place];  Service: Endoscopy;;   HOT HEMOSTASIS N/A 07/06/2014   Procedure: HOT HEMOSTASIS (ARGON PLASMA COAGULATION/BICAP);  Surgeon: Clarene Essex, MD;  [name];  Service: Endoscopy;  Laterality: N/A;   HOT HEMOSTASIS N/A 09/28/2015   Procedure: HOT HEMOSTASIS (ARGON PLASMA COAGULATION/BICAP);  Surgeon: Clarene Essex, MD;  [facility];  Service: Endoscopy;  Laterality: N/A;   HOT HEMOSTASIS N/A 12/04/2017   Procedure: HOT HEMOSTASIS (ARGON PLASMA COAGULATION/BICAP);  Surgeon: [name], MD;  Location: [place];  Service: Endoscopy;  Laterality: N/A;   IR ANGIOGRAM SELECTIVE EACH ADDITIONAL VESSEL  02/05/2021   IR ANGIOGRAM VISCERAL SELECTIVE  02/05/2021   IR EMBO ART  VEN HEMORR LYMPH EXTRAV  INC GUIDE ROADMAPPING  02/05/2021   IR US GUIDE VASC ACCESS RIGHT  02/05/2021   NASAL HEMORRHAGE CONTROL     "for bleeding"    SAVORY DILATION  02/26/2011   Procedure: SAVORY DILATION;  Surgeon: [name], MD;  [name];  Service: Endoscopy;  Laterality: N/A;  c-arm needed   SCLEROTHERAPY  12/24/2020   Procedure: SCLEROTHERAPY;  Surgeon: Arta Silence, MD;  Location[facility] END[facility]COPY;  Service: Endoscopy;;   SCLEROTHERAPY  01/19/2021   Procedure: Clide Deutscher;  Surgeon: [name], MD;  Location[facility]COPY;  Service: Endoscopy;;   SUBMUCOSAL INJECTION  12/04/2017   Procedure: SUBMUCOSAL INJECTION;  Surgeon: [name], MD;  Location: [facility] ENDOSCOPY;  Service: Endoscopy;;   Current Facility-Administered Medications  Medication Dose Route Frequency Provider Last Rate Last Admin   acetaminophen (TYLENOL) tablet 650 mg  650 mg Oral Q6H PRN Loney Laurence, DO   650 mg at 11/22/21 1932   Or   acetaminophen (TYLENOL) suppository  650 mg  650 mg Rectal Q6H PRN Loney Laurence, DO       Chlorhexidine Gluconate Cloth 2 % PADS 6 each  6 each Topical Daily Loney Laurence, DO   6 each at 11/22/21 1056   insulin aspart (novoLOG) injection 0-9 Units  0-9 Units Subcutaneous Q4H Danton Clap H, DO   2 Units at 11/22/21 1950   levETIRAcetam (KEPPRA) tablet 500 mg  500 mg Oral BID Shawna Clamp, MD   500 mg at 11/22/21 2208   mupirocin ointment (BACTROBAN) 2 % 1 Application  1 Application Nasal BID Loney Laurence, DO   1 Application at 20/94/70 2244   Oral care mouth rinse  15 mL Mouth Rinse PRN Loney Laurence, DO       [START ON 11/24/2021] pantoprazole (PROTONIX) injection 40 mg  40 mg Intravenous Q12H Nevaeha Finerty H, DO       pantoprozole (PROTONIX) 80 mg /NS 100 mL infusion  8 mg/hr Intravenous Continuous Loney Laurence, DO 10 mL/hr at 11/23/21 0105 8 mg/hr at 11/23/21 0105   sodium chloride flush (NS) 0.9 % injection 10-40 mL  10-40 mL Intracatheter Q12H Danton Clap H, DO   10 mL at 11/21/21 1023   sodium chloride flush (NS) 0.9 % injection 10-40 mL  10-40 mL Intracatheter PRN Loney Laurence, DO       Facility-Administered Medications Ordered in Other Encounters  Medication Dose Route Frequency Provider Last Rate Last Admin   sodium  chloride 0.9 % injection 10 mL  10 mL Intracatheter PRN Truitt Merle, MD       sodium chloride flush (NS) 0.9 % injection 10 mL  10 mL Intracatheter PRN Truitt Merle, MD   10 mL at 08/18/19 1717   Allergies as of 11/20/2021 - Review Complete 11/20/2021  Allergen Reaction Noted   Aspirin Nausea And Vomiting 05/12/2006   Review of Systems:  Review of Systems  Respiratory:  Negative for shortness of breath.   Cardiovascular:  Negative for chest pain.  Gastrointestinal:  Negative for abdominal pain, nausea and vomiting.    OBJECTIVE:   Temp:  [98.3 F (36.8 C)-98.6 F (37 C)] 98.6 F (37 C) (10/15 0400) Pulse Rate:  [63-75] 63 (10/15 0400) Resp:  [9-23] 17  (10/15 0400) BP: (112-177)/(38-101) 156/47 (10/15 0400) SpO2:  [97 %-100 %] 100 % (10/15 0400) Last BM Date :  (PTA) Physical Exam Constitutional:      General: She is not in acute distress.    Appearance: She is not ill-appearing, toxic-appearing or diaphoretic.  Cardiovascular:     Rate and Rhythm: Normal rate and regular rhythm.  Pulmonary:     Effort: No respiratory distress.     Breath sounds: Normal breath sounds.  Abdominal:     General: Bowel sounds are normal. There is no distension.     Palpations: Abdomen is soft.     Tenderness: There is no abdominal tenderness. There is no guarding.  Neurological:     Mental Status: She is alert.     Labs: Recent Labs    11/20/21 2323 11/21/21 0306 11/22/21 0505 11/22/21 1309 11/23/21 0528  WBC 7.7  --   --   --  5.8  HGB 7.9*   < > 7.4* 7.5* 7.8*  HCT 26.2*   < > 23.1* 23.8* 24.5*  PLT 202  --   --   --  139*   < > = values in this interval not displayed.   BMET Recent Labs    11/21/21 0306 11/22/21 0505 11/23/21 0528  NA 145 146* 145  K 4.8 3.7 3.4*  CL 116* 119* 116*  CO2 '23 25 24  '$ GLUCOSE 300* 114* 130*  BUN 31* 38* 26*  CREATININE 1.20* 1.31* 1.18*  CALCIUM 8.1* 8.2* 8.4*   LFT Recent Labs    11/21/21 0306  PROT 4.4*  ALBUMIN 2.2*  AST 24  ALT 15  ALKPHOS 103  BILITOT 0.7   PT/INR Recent Labs    11/20/21 2323  LABPROT 14.7  INR 1.2   Diagnostic imaging: ECHOCARDIOGRAM COMPLETE  Result Date: 11/22/2021    ECHOCARDIOGRAM REPORT   Patient Name:   [name] Date of Exam: 11/22/2021 Medical Rec #:  [id number]      Height:       63.0 in Accession #:    [id number]     Weight:       128.5 lb Date of Birth:  February 16, 1940       BSA:          1.602 m Patient Age:    57 years       BP:           156/59 mmHg Patient Gender: F              HR:           74 bpm. Exam Location:  Inpatient Procedure: 2D Echo, Color Doppler and Cardiac Doppler Indications:    CHF/Murmur  History:  Patient has prior  history of Echocardiogram examinations, most                 recent 09/29/2020. Pulmonary HTN, AS; Risk Factors:Diabetes,                 Hypertension and Former Smoker.  Sonographer:    Eartha Inch Referring Phys: [id number] [place]  Sonographer Comments: Technically challenging study due to limited acoustic windows. Image acquisition challenging due to patient body habitus and Image acquisition challenging due to uncooperative patient. IMPRESSIONS  1. Study truncated due to patient cooperation. Consider repeat echocardiogram per patient preference.  2. Left ventricular ejection fraction, by estimation, is 70 to 75%. The left ventricle has hyperdynamic function. The left ventricle has no regional wall motion abnormalities. There is mild left ventricular hypertrophy. Left ventricular diastolic function could not be evaluated.  3. Right ventricular systolic function is mildly reduced. The right ventricular size is normal. IVC not imaged.  4. A small pericardial effusion is present.  5. The mitral valve is grossly normal. Trivial mitral valve regurgitation in parasternal view. MV stenosis not assessed.  6. The aortic valve is grossly normal. There is mild calcification of the aortic valve. Aortic valve regurgitation is not visualized in parasternal view. AV stenosis not assessed. FINDINGS  Left Ventricle: Left ventricular ejection fraction, by estimation, is 70 to 75%. The left ventricle has hyperdynamic function. The left ventricle has no regional wall motion abnormalities. The left ventricular internal cavity size was normal in size. There is mild left ventricular hypertrophy. Left ventricular diastolic function could not be evaluated. Right Ventricle: The right ventricular size is normal. No increase in right ventricular wall thickness. Right ventricular systolic function is mildly reduced. Left Atrium: Left atrial size was normal in size. Right Atrium: Right atrial size was normal in size. Pericardium: A  small pericardial effusion is present. Presence of epicardial fat layer. Mitral Valve: The mitral valve is grossly normal. Trivial mitral valve regurgitation. Tricuspid Valve: The tricuspid valve is grossly normal. Tricuspid valve regurgitation is trivial. No evidence of tricuspid stenosis. Aortic Valve: The aortic valve is grossly normal. There is mild calcification of the aortic valve. Aortic valve regurgitation is not visualized. Pulmonic Valve: The pulmonic valve was grossly normal. Pulmonic valve regurgitation is trivial. No evidence of pulmonic stenosis. Aorta: The aortic root is normal in size and structure. IAS/Shunts: The interatrial septum was not assessed.  LEFT VENTRICLE PLAX 2D LVIDd:         4.10 cm LVIDs:         2.30 cm LV PW:         1.20 cm LV IVS:        1.10 cm LVOT diam:     1.70 cm LVOT Area:     2.27 cm  LEFT ATRIUM         Index LA diam:    4.30 cm 2.68 cm/m                        PULMONIC VALVE AORTA                 PV Vmax:       1.89 m/s Ao Root diam: 2.50 cm PV Vmean:      125.010 cm/s Ao Asc diam:  3.20 cm PV VTI:        0.426 m  PV Peak grad:  14.2 mmHg                       PV Mean grad:  7.2 mmHg  TRICUSPID VALVE TR Peak grad:   25.2 mmHg TR Vmax:        251.00 cm/s  SHUNTS Systemic Diam: 1.70 cm Cherlynn Kaiser MD Electronically signed by Cherlynn Kaiser MD Signature Date/Time: 11/22/2021/12:05:55 PM    Final     IMPRESSION: Hematemesis, prior to admission, no further  -S/p EGD 11/22/2021 with finding of cratered gastric ulcer along greater curvature with pigmented material status post Hemoclip placement x4 Normocytic, acute blood loss anemia Hereditary hemorrhagic telangiectasia History gastric ulcer December 2022 requiring epinephrine and hemoclip placement   Other: pulmonary hypertension, diastolic congestive heart failure, hypertension, type 2 diabetes mellitus  PLAN: -Continue IV PPI every 12 hours while inpatient, can transition to  pantoprazole 40 mg p.o. twice daily on discharge -Trend H/H, transfuse for hemoglobin less than 7 -Okay for GI soft diet today -Recommend repeat EGD in the outpatient setting in roughly 1 month to follow gastric ulcer healing -GI will follow   LOS: 2 days   Danton Clap, [facility]

## 2021-11-23 NOTE — Plan of Care (Signed)
  Problem: Bowel/Gastric: Goal: Will show no signs and symptoms of gastrointestinal bleeding Outcome: Progressing   Problem: Fluid Volume: Goal: Will show no signs and symptoms of excessive bleeding Outcome: Progressing   

## 2021-11-23 NOTE — Evaluation (Signed)
Occupational Therapy Evaluation Patient Details Name: [name] MRN: [id number] DOB: [birth year] Today's Date: 11/23/2021   History of Present Illness Patient is a 81 year old female who presented with hematemesis. patient was found to have low hgb of 6.5 requiring 2 units of PRBCs. patient underwent EGD on 10/14 and found to have nonbleeding gastric ulcer. 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 Patient is a long term care resident at SNF. Unclear about baseline for ADLs as family is not in room and patient is noted to have some confusion during session. Patient needed STEDY to transfer from edge of bed to chair in room with increased reports of pain in bilateral knees with attempts at standing.  Patient was noted to have decreased functional activity tolernace, decreased ROM, decreased BUE strength, decreased endurance, decreased sitting balance, decreased standing balanced, decreased safety awareness, and decreased knowledge of AE/AD impacting participation in ADLs.  Patient would continue to benefit from skilled OT services at this time while admitted and after d/c to address noted deficits in order to improve overall safety and independence in ADLs.       Recommendations for follow up therapy are one component of a multi-disciplinary discharge planning process, led by the attending physician.  Recommendations may be updated based on patient status, additional functional criteria and insurance authorization.   Follow Up Recommendations  Skilled nursing-short term rehab (<3 hours/day)    Assistance Recommended at Discharge Frequent or constant Supervision/Assistance  Patient can return home with the following Two people to help with  walking and/or transfers;Two people to help with bathing/dressing/bathroom;Direct supervision/assist for medications management;Help with stairs or ramp for entrance;Assist for transportation;Assistance with cooking/housework;Direct supervision/assist for financial management    Functional Status Assessment  Patient has had a recent decline in their functional status and demonstrates the ability to make significant improvements in function in a reasonable and predictable amount of time.  Equipment Recommendations  None recommended by OT    Recommendations for Other Services       Precautions / Restrictions Precautions Precautions: Fall Restrictions Weight Bearing Restrictions: No      Mobility Bed Mobility Overal bed mobility: Needs Assistance Bed Mobility: Supine to Sit     Supine to sit: Mod assist, +2 for safety/equipment, +2 for physical assistance     General bed mobility comments: with increased physical assist to scoot to the edge of bed.    Transfers                          Balance Overall balance assessment: Needs assistance Sitting-balance support: Feet supported, Bilateral upper extremity supported Sitting balance-Leahy Scale: Poor Sitting balance - Comments: needed min A for sitting balance   Standing balance support: Reliant on assistive device for balance, Bilateral upper extremity supported Standing balance-Leahy Scale: Zero                             ADL either performed or assessed with clinical judgement   ADL Overall ADL's : Needs assistance/impaired Eating/Feeding: Minimal assistance   Grooming: Minimal assistance;Sitting   Upper Body Bathing: Minimal assistance;Bed level   Lower Body Bathing: Bed level;Maximal assistance   Upper Body Dressing : Minimal assistance;Bed level  Lower Body Dressing: Bed level;Maximal assistance   Toilet Transfer: +2 for safety/equipment;+2 for physical assistance Toilet Transfer  Details (indicate cue type and reason): patient attempted standing with increased pain on BLE with standing at RW. patient needed STEDY to transfer from edge of bed to recliner in room with leaning to R noted. Toileting- Clothing Manipulation and Hygiene: Bed level;Total assistance               Vision         Perception     Praxis      Pertinent Vitals/Pain Pain Assessment Pain Assessment: Faces Faces Pain Scale: Hurts even more Pain Location: bilateral knees when attempting to stand Pain Descriptors / Indicators: Moaning, Grimacing Pain Intervention(s): Limited activity within patient's tolerance, Monitored during session, Repositioned     Hand Dominance Right   Extremity/Trunk Assessment Upper Extremity Assessment Upper Extremity Assessment: Generalized weakness   Lower Extremity Assessment Lower Extremity Assessment: Defer to PT evaluation   Cervical / Trunk Assessment Cervical / Trunk Assessment: Kyphotic   Communication Communication Communication: No difficulties   Cognition Arousal/Alertness: Awake/alert Behavior During Therapy: WFL for tasks assessed/performed Overall Cognitive Status: Within Functional Limits for tasks assessed                                 General Comments: patient noted to have some confusion during session but was over  all very appropriate and plesant     General Comments       Exercises     Shoulder Instructions      Home Living Family/patient expects to be discharged to:: Skilled nursing facility                                 Additional Comments: per chart      Prior Functioning/Environment Prior Level of Function : Needs assist;Patient poor historian/Family not available             Mobility Comments: patient reported using a rollator at SNF ADLs Comments: Needs assist with bathing/dressing. unclear prior level with patient able to provide some information but no family in room to  verify.        OT Problem List: Decreased activity tolerance;Impaired balance (sitting and/or standing);Decreased safety awareness;Cardiopulmonary status limiting activity;Decreased knowledge of precautions;Decreased knowledge of use of DME or AE;Pain      OT Treatment/Interventions: Self-care/ADL training;Therapeutic exercise;Neuromuscular education;Energy conservation;DME and/or AE instruction;Therapeutic activities;Balance training;Patient/family education    OT Goals(Current goals can be found in the care plan section) Acute Rehab OT Goals Patient Stated Goal: to find cellphone OT Goal Formulation: Patient unable to participate in goal setting Time For Goal Achievement: 12/07/21 Potential to Achieve Goals: Fair ADL Goals Pt Will Perform Grooming: with supervision;sitting Additional ADL Goal #1: Patient will perform 10 min functional activity or exercise activity as evidence of improving activity tolerance  OT Frequency: Min 2X/week    Co-evaluation PT/OT/SLP Co-Evaluation/Treatment: Yes Reason for Co-Treatment: For patient/therapist safety;To address functional/ADL transfers PT goals addressed during session: Mobility/safety with mobility OT goals addressed during session: ADL's and self-care      AM-PAC OT "6 Clicks" Daily Activity     Outcome Measure Help from another person eating meals?: A [place] Help from another person taking care of personal grooming?: A Andrew Help from another person toileting, which includes using toliet, bedpan, or urinal?: Total Help from  another person bathing (including washing, rinsing, drying)?: A Lot Help from another person to put on and taking off regular upper body clothing?: A Lot Help from another person to put on and taking off regular lower body clothing?: A Lot 6 Click Score: 13   End of Session Equipment Utilized During Treatment: Gait belt;Rolling walker (2 wheels);Other (comment) (STEDY) Nurse Communication: Mobility  status  Activity Tolerance: Patient tolerated treatment well Patient left: in chair;with call bell/phone within reach;with chair alarm set  OT Visit Diagnosis: Unsteadiness on feet (R26.81);Other abnormalities of gait and mobility (R26.89);Muscle weakness (generalized) (M62.81);Pain Pain - part of body: Knee                Time: 7425-9563 OT Time Calculation (min): 23 min Charges:  OT General Charges $OT Visit: 1 Visit OT Evaluation $OT Eval Moderate Complexity: 1 Mod  Latecia Miler OTR/L, MS Acute Rehabilitation Department Office# [phone]   Marcellina Millin 11/23/2021, 12:51 PM

## 2021-11-24 LAB — GLUCOSE, CAPILLARY: Glucose-Capillary: 102 mg/dL — ABNORMAL HIGH (ref 70–99)

## 2021-11-25 ENCOUNTER — Encounter (HOSPITAL_COMMUNITY): Payer: Self-pay | Admitting: Internal Medicine

## 2021-12-08 ENCOUNTER — Ambulatory Visit (INDEPENDENT_AMBULATORY_CARE_PROVIDER_SITE_OTHER): Payer: Medicare Other | Admitting: Student

## 2021-12-08 ENCOUNTER — Telehealth: Payer: Self-pay | Admitting: *Deleted

## 2021-12-08 VITALS — BP 155/48 | HR 60 | Temp 98.0°F

## 2021-12-08 DIAGNOSIS — Z8719 Personal history of other diseases of the digestive system: Secondary | ICD-10-CM

## 2021-12-08 DIAGNOSIS — K922 Gastrointestinal hemorrhage, unspecified: Secondary | ICD-10-CM | POA: Diagnosis not present

## 2021-12-08 DIAGNOSIS — G629 Polyneuropathy, unspecified: Secondary | ICD-10-CM | POA: Diagnosis not present

## 2021-12-08 DIAGNOSIS — N179 Acute kidney failure, unspecified: Secondary | ICD-10-CM

## 2021-12-08 MED ORDER — GABAPENTIN 100 MG PO CAPS: ORAL_CAPSULE | ORAL | Status: DC

## 2021-12-08 NOTE — Patient Instructions (Addendum)
Ms.Texanna L Grundman, it was a pleasure seeing you today!  Today we discussed: - We are going to re-check some lab work today to make sure your blood counts look good. I am also checking some lab work for your leg pain.  - I would like for you to take gabapentin '100mg'$  in the morning and increase to '200mg'$  in the evening for your nighttime pain.   - Please continue taking pantoprazole twice daily and follow-up with your GI doctors.   I have ordered the following labs today:  Lab Orders         Anemia Profile B         BMP8+Anion Gap       I have ordered the following medication/changed the following medications:   Start the following medications: Meds ordered this encounter  Medications   gabapentin (NEURONTIN) 100 MG capsule    Sig: Take 1 capsule (100 mg total) by mouth in the morning AND 2 capsules (200 mg total) at bedtime.     Follow-up: 3 months   Please make sure to arrive 15 minutes prior to your next appointment. If you arrive late, you may be asked to reschedule.   We look forward to seeing you next time. Please call our clinic at [phone] if you have any questions or concerns. The best time to call is Monday-Friday from 9am-4pm, but there is someone available 24/7. If after hours or the weekend, call the main hospital number and ask for the Internal Medicine Resident On-Call. If you need medication refills, please notify your pharmacy one week in advance and they will send [place] a request.  Thank you for letting us take part in your care. Wishing you the best!  Thank you, Sanjuan Dame, MD

## 2021-12-08 NOTE — Telephone Encounter (Signed)
Annette from [facility]  called requesting clarification on new dose of gabapentin. States AVS has 100 mg in AM and 200 mg at bedtime. However, handwritten consult sheet said increase gabapentin 300 mg at bedtime. Please clarify which dose is correct.

## 2021-12-08 NOTE — Assessment & Plan Note (Addendum)
[name] presents today reporting one month history of lower extremity neuropathy. Describes sharp, shooting pains throughout both of her legs, especially at night. Does not associate any symptoms with movement. She does have significant history of diabetes but this has been well-controlled for a few years now. Recent TSH, B12 a few weeks ago normal. She does have known iron deficiency, but we will re-check these labs and check BMP today. Since the majority of her symptoms occur at night, I have increased her gabapentin evening dose. Given her age and co-morbidities, will be cautious in increasing dosage.   - Increase gabapentin '100mg'$  in the morning, '200mg'$  in the evening - Follow-up iron studies today

## 2021-12-08 NOTE — Assessment & Plan Note (Signed)
[name] was recently discharged from the hospital 10/15 for acute upper GI bleed. During her stay, GI was consulted and performed EGD, which showed non-bleeding gastric ulcer. At the time she was placed on oral PPI twice daily and plan was to repeat EGD in one month.  Today, [name] reports she is doing well. She has not had any further episodes of hematemesis since discharge. She has been eating well without issues, having normal 1-2 BM daily. Denies any melena or bright red blood in toilet. Reports compliance with her medications.   - Continue pantoprazole '40mg'$  twice daily - Repeat EGD with GI in a few weeks - Follow-up CBC, BMP today

## 2021-12-08 NOTE — Progress Notes (Signed)
CC: hospital follow-up  HPI:  [name] is a 81 y.o. person with medical history as below presenting to [facility] for hospital follow-up.  Please see problem-based list for further details, assessments, and plans.  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   Hematemesis 12/04/2017   History of blood transfusion "several"   History of epistaxis    HTN (hypertension), benign 03/02/2012   Hyperlipidemia    Iron deficiency anemia    chronic infusions"   Lichen planus    Both lower extremities   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   Review of Systems:  As per HPI  Physical Exam:  Vitals:   12/08/21 1016 12/08/21 1042  BP: (!) 167/46 (!) 155/48  Pulse: 62 60  Temp: 98 F (36.7 C)   TempSrc: Oral   SpO2: 99%    General: Elderly appearing, resting comfortably in chair in no acute distress CV: Regular rate, rhythm. No murmurs. Warm extremities. 2+ distal pulses bilaterally Pulm: Normal work of breathing on room air. Clear to ausculation bilaterally MSK: Normal bulk, tone. No peripheral edema appreciated.  Skin: Warm, dry. No rashes or lesions. Neuro: Awake, alert, conversing appropriately.  Assessment & Plan:   History of GI bleed [name] was recently discharged from the  hospital 10/15 for acute upper GI bleed. During her stay, GI was consulted and performed EGD, which showed non-bleeding gastric ulcer. At the time she was placed on oral PPI twice daily and plan was to repeat EGD in one month.  Today, [name] reports she is doing well. She has not had any further episodes of hematemesis since discharge. She has been eating well without issues, having normal 1-2 BM daily. Denies any melena or bright red blood in toilet. Reports compliance with her medications.   - Continue pantoprazole '40mg'$  twice daily - Repeat EGD with GI in a few weeks - Follow-up CBC, BMP today  Neuropathy [name] presents today reporting one month history of lower extremity neuropathy. Describes sharp, shooting pains throughout both of her legs, especially at night. Does not associate any symptoms with movement. She does have significant history of diabetes but this has been well-controlled for a few years now. Recent TSH, B12 a few weeks ago normal. She does have known iron deficiency, but we will re-check these labs and check BMP today. Since the majority of her symptoms occur at night, I have increased her gabapentin evening dose. Given her age and co-morbidities, will be cautious in increasing dosage.   - Increase gabapentin '100mg'$  in the morning, '200mg'$  in the evening - Follow-up iron studies today  Patient discussed with Dr. NaLise AuerMD Internal  Medicine PGY-3 Pager: [phone]

## 2021-12-09 ENCOUNTER — Encounter: Payer: Self-pay | Admitting: Hematology

## 2021-12-09 NOTE — Progress Notes (Signed)
Internal Medicine Clinic Attending ? ?Case discussed with Dr. Braswell  At the time of the visit.  We reviewed the resident?s history and exam and pertinent patient test results.  I agree with the assessment, diagnosis, and plan of care documented in the resident?s note.  ?

## 2021-12-10 ENCOUNTER — Encounter: Payer: Self-pay | Admitting: Hematology

## 2021-12-10 LAB — ANEMIA PROFILE B
Basophils Absolute: 0 10*3/uL (ref 0.0–0.2)
Basos: 1 %
EOS (ABSOLUTE): 0.4 10*3/uL (ref 0.0–0.4)
Eos: 6 %
Ferritin: 18 ng/mL (ref 15–150)
Folate: 10.6 ng/mL (ref >3.0)
Hematocrit: 31 % — ABNORMAL LOW (ref 34.0–46.6)
Hemoglobin: 9.6 g/dL — ABNORMAL LOW (ref 11.1–15.9)
Immature Grans (Abs): 0 10*3/uL (ref 0.0–0.1)
Immature Granulocytes: 1 %
Iron Saturation: 11 % — ABNORMAL LOW (ref 15–55)
Iron: 20 ug/dL — ABNORMAL LOW (ref 27–139)
Lymphocytes Absolute: 0.8 10*3/uL (ref 0.7–3.1)
Lymphs: 12 %
MCH: 27 pg (ref 26.6–33.0)
MCHC: 31 g/dL — ABNORMAL LOW (ref 31.5–35.7)
MCV: 87 fL (ref 79–97)
Monocytes Absolute: 0.5 10*3/uL (ref 0.1–0.9)
Monocytes: 8 %
Neutrophils Absolute: 4.7 10*3/uL (ref 1.4–7.0)
Neutrophils: 72 %
Platelets: 207 10*3/uL (ref 150–450)
RBC: 3.55 x10E6/uL — ABNORMAL LOW (ref 3.77–5.28)
RDW: 14.5 % (ref 11.7–15.4)
Retic Ct Pct: 2.3 % (ref 0.6–2.6)
Total Iron Binding Capacity: 189 ug/dL — ABNORMAL LOW (ref 250–450)
UIBC: 169 ug/dL (ref 118–369)
Vitamin B-12: 651 pg/mL (ref 232–1245)
WBC: 6.5 10*3/uL (ref 3.4–10.8)

## 2021-12-10 LAB — BMP8+ANION GAP
Anion Gap: 14 mmol/L (ref 10.0–18.0)
BUN/Creatinine Ratio: 15 (ref 12–28)
BUN: 14 mg/dL (ref 8–27)
CO2: 21 mmol/L (ref 20–29)
Calcium: 8.9 mg/dL (ref 8.7–10.3)
Chloride: 112 mmol/L — ABNORMAL HIGH (ref 96–106)
Creatinine, Ser: 0.93 mg/dL (ref 0.57–1.00)
Glucose: 104 mg/dL — ABNORMAL HIGH (ref 70–99)
Potassium: 3.8 mmol/L (ref 3.5–5.2)
Sodium: 147 mmol/L — ABNORMAL HIGH (ref 134–144)
eGFR: 62 mL/min/{1.73_m2} (ref >59)

## 2021-12-17 ENCOUNTER — Ambulatory Visit: Payer: Medicare Other | Admitting: Hematology

## 2021-12-17 ENCOUNTER — Other Ambulatory Visit: Payer: Medicare Other

## 2021-12-19 ENCOUNTER — Other Ambulatory Visit: Payer: Self-pay

## 2021-12-19 ENCOUNTER — Emergency Department (HOSPITAL_COMMUNITY)
Admission: EM | Admit: 2021-12-19 | Discharge: 2021-12-20 | Disposition: A | Discharge status: Skilled Nursing Facility | Payer: Medicare Other | Attending: Emergency Medicine | Admitting: Emergency Medicine

## 2021-12-19 ENCOUNTER — Encounter (HOSPITAL_COMMUNITY): Payer: Self-pay | Admitting: *Deleted

## 2021-12-19 DIAGNOSIS — R5383 Other fatigue: Secondary | ICD-10-CM | POA: Diagnosis not present

## 2021-12-19 DIAGNOSIS — I5032 Chronic diastolic (congestive) heart failure: Secondary | ICD-10-CM | POA: Diagnosis not present

## 2021-12-19 DIAGNOSIS — I110 Hypertensive heart disease with heart failure: Secondary | ICD-10-CM | POA: Diagnosis not present

## 2021-12-19 DIAGNOSIS — F0390 Unspecified dementia without behavioral disturbance: Secondary | ICD-10-CM | POA: Insufficient documentation

## 2021-12-19 DIAGNOSIS — Z79899 Other long term (current) drug therapy: Secondary | ICD-10-CM | POA: Insufficient documentation

## 2021-12-19 DIAGNOSIS — R531 Weakness: Secondary | ICD-10-CM | POA: Insufficient documentation

## 2021-12-19 DIAGNOSIS — Z85118 Personal history of other malignant neoplasm of bronchus and lung: Secondary | ICD-10-CM | POA: Diagnosis not present

## 2021-12-19 DIAGNOSIS — E119 Type 2 diabetes mellitus without complications: Secondary | ICD-10-CM | POA: Diagnosis not present

## 2021-12-19 DIAGNOSIS — Z794 Long term (current) use of insulin: Secondary | ICD-10-CM | POA: Insufficient documentation

## 2021-12-19 LAB — URINALYSIS, ROUTINE W REFLEX MICROSCOPIC
Bilirubin Urine: NEGATIVE
Glucose, UA: NEGATIVE mg/dL
Ketones, ur: NEGATIVE mg/dL
Leukocytes,Ua: NEGATIVE
Nitrite: NEGATIVE
Protein, ur: 100 mg/dL — AB
Specific Gravity, Urine: 1.009 (ref 1.005–1.030)
pH: 8 (ref 5.0–8.0)

## 2021-12-19 LAB — COMPREHENSIVE METABOLIC PANEL
ALT: 11 U/L (ref 0–44)
AST: 21 U/L (ref 15–41)
Albumin: 2.5 g/dL — ABNORMAL LOW (ref 3.5–5.0)
Alkaline Phosphatase: 98 U/L (ref 38–126)
Anion gap: 7 (ref 5–15)
BUN: 15 mg/dL (ref 8–23)
CO2: 25 mmol/L (ref 22–32)
Calcium: 9 mg/dL (ref 8.9–10.3)
Chloride: 112 mmol/L — ABNORMAL HIGH (ref 98–111)
Creatinine, Ser: 1.27 mg/dL — ABNORMAL HIGH (ref 0.44–1.00)
GFR, Estimated: 42 mL/min — ABNORMAL LOW (ref >60)
Glucose, Bld: 121 mg/dL — ABNORMAL HIGH (ref 70–99)
Potassium: 4.3 mmol/L (ref 3.5–5.1)
Sodium: 144 mmol/L (ref 135–145)
Total Bilirubin: 0.6 mg/dL (ref 0.3–1.2)
Total Protein: 5.4 g/dL — ABNORMAL LOW (ref 6.5–8.1)

## 2021-12-19 LAB — CBC WITH DIFFERENTIAL/PLATELET
Abs Immature Granulocytes: 0.01 10*3/uL (ref 0.00–0.07)
Basophils Absolute: 0 10*3/uL (ref 0.0–0.1)
Basophils Relative: 1 %
Eosinophils Absolute: 0.4 10*3/uL (ref 0.0–0.5)
Eosinophils Relative: 8 %
HCT: 29.1 % — ABNORMAL LOW (ref 36.0–46.0)
Hemoglobin: 8.6 g/dL — ABNORMAL LOW (ref 12.0–15.0)
Immature Granulocytes: 0 %
Lymphocytes Relative: 25 %
Lymphs Abs: 1.3 10*3/uL (ref 0.7–4.0)
MCH: 25.6 pg — ABNORMAL LOW (ref 26.0–34.0)
MCHC: 29.6 g/dL — ABNORMAL LOW (ref 30.0–36.0)
MCV: 86.6 fL (ref 80.0–100.0)
Monocytes Absolute: 0.7 10*3/uL (ref 0.1–1.0)
Monocytes Relative: 13 %
Neutro Abs: 2.7 10*3/uL (ref 1.7–7.7)
Neutrophils Relative %: 53 %
Platelets: 207 10*3/uL (ref 150–400)
RBC: 3.36 MIL/uL — ABNORMAL LOW (ref 3.87–5.11)
RDW: 15 % (ref 11.5–15.5)
WBC: 5.1 10*3/uL (ref 4.0–10.5)
nRBC: 0 % (ref 0.0–0.2)

## 2021-12-19 LAB — TSH: TSH: 2.127 u[IU]/mL (ref 0.350–4.500)

## 2021-12-19 LAB — POC OCCULT BLOOD, ED: Fecal Occult Bld: NEGATIVE

## 2021-12-19 LAB — AMMONIA: Ammonia: 90 umol/L — ABNORMAL HIGH (ref 9–35)

## 2021-12-19 MED ORDER — LACTATED RINGERS IV BOLUS
1000.0000 mL | Freq: Once | INTRAVENOUS | Status: AC
  Administered 2021-12-19: 1000 mL via INTRAVENOUS

## 2021-12-19 NOTE — Discharge Instructions (Addendum)
[name] was seen in the emergency department today.  While she was here, we performed a physical exam, and checked many labs.    Her ammonia level was slightly high today, but around her baseline.  Please continue to give her her 3 times daily lactulose as prescribed.  Otherwise her low labs were overall unremarkable, without any concerning acute findings today.  Her urine overall looks not infected, however please follow-up with the results of the urine culture and give antibiotics if this grows bacteria.  Please bring her back to an emergency department if she starts having slurred speech, facial droop, weakness on one side of her body that is new, passing out, significant changes in mental status, high fevers, or if you have any other reason to think she needs emergency care.  We hope that she feels better soon.

## 2021-12-19 NOTE — ED Triage Notes (Signed)
Thew pt arrived from nursing home [facility] the pt has not been as  awake as she normally is  she just had her neurontin increased to two times a day.  The pt is only alert to one  she does not know the date or month or year  syhe knows she at some hospital but not which one

## 2021-12-19 NOTE — ED Provider Notes (Signed)
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  Per EMS, the facility reported that she was "acting weird "and more tired than normal.  They did report a recent increase in dose of her Neurontin from 1 to 2 pills/day.  On arrival, the patient denies any complaints at this time.  She denies chest pain, shortness of breath, abdominal pain, recent bleeding, and states that she feels good.  The history is provided by the patient and medical records.      Home Medications Prior to Admission medications   Medication Sig Start Date End Date Taking? Authorizing Provider  amLODipine (NORVASC) 5 MG tablet Take 1 tablet (5 mg total) by mouth daily. 09/25/21 09/20/22  Lacinda Axon, MD  bisacodyl (DULCOLAX) 10 MG suppository Place 10 mg rectally as needed for moderate constipation.    [provider]  diclofenac Sodium (VOLTAREN) 1 % GEL Apply 2 g topically 4 (four) times daily. 12/26/20   Lacinda Axon, MD  gabapentin (NEURONTIN) 100 MG capsule Take 1 capsule (100 mg total) by mouth in the morning AND 2 capsules (200 mg total) at bedtime. 12/08/21   Sanjuan Dame, MD  guaiFENesin (ROBITUSSIN) 100 MG/5ML liquid Take 200 mg by mouth every 4 (four) hours as needed for cough.    [provider]  insulin lispro (HUMALOG KWIKPEN) 100 UNIT/ML KwikPen Inject 3 Units into the skin 3 (three) times  daily. Patient taking differently: Inject 5 Units into the skin See admin instructions. 5 units three times daily before meals, with additional 2 units if DM2 >250. May give additional 10 units once as needed for hyperglycemia. 09/25/21   Lacinda Axon, MD  lactulose (CHRONULAC) 10 GM/15ML solution Take 15 mLs by mouth in the morning, at noon, and at bedtime. 09/27/21   [provider]  LANTUS SOLOSTAR 100 UNIT/ML Solostar Pen Inject 10 Units into the skin at bedtime. Patient taking differently: Inject 7 Units into the skin 2 (two) times daily. 09/25/21   Lacinda Axon, MD  levETIRAcetam (KEPPRA) 500 MG tablet Take 1 tablet (500 mg total) by mouth 2 (two) times daily. 02/12/21   Orvis Brill, MD  Magnesium Hydroxide (MILK OF MAGNESIA PO) Take 30 mLs by mouth every evening.    [provider]  memantine (NAMENDA) 10 MG tablet TAKE 1 TABLET BY MOUTH TWICE DAILY Patient taking differently: Take 10 mg by mouth 2 (two) times daily. 11/11/20   [name], Areeg N, DO  mirtazapine (REMERON) 7.5 MG tablet Take 7.5 mg by mouth at bedtime. 06/02/21   [provider]  Misc Natural Products (IMMUNE FORMULA PO) Take 1 tablet by mouth daily.    [provider]  Multiple Vitamins-Minerals (MULTI FOR HER 50+) TABS Take 1 tablet by mouth daily with breakfast.    [provider]  pantoprazole (PROTONIX) 40 MG tablet Take 1 tablet (40 mg total) by mouth 2 (two) times daily. 12/26/20   Lacinda Axon, MD  torsemide (DEMADEX) 20 MG tablet Take 1 tablet (20 mg total) by mouth as needed (please take 1 '20mg'$  tablet as needed for weight gain of 2 pounds overnight or 5 pounds in a week). 03/05/21 01/17/22  Lenna Sciara, NP  triamcinolone cream (KENALOG) 0.1 % Apply 1 Application topically 2 (two) times daily.    [provider]  TRULICITY 3 EV/0.3JK SOPN Inject 3 mg into the skin once a week. Patient taking differently: Inject 3 mg into the skin once a week.  Every Thursday 09/25/21   Lacinda Axon, MD      Allergies    Aspirin    Review of Systems   Review of Systems See HPI.   Physical Exam Updated Vital Signs BP (!) 172/62 (BP Location: Right Arm)   Pulse 65   Temp 97.8 F (36.6 C) (Oral)   Resp 16   Ht '5\' 3"'$  (1.6 m)   Wt 58.3 kg   LMP  (LMP Unknown)   SpO2 99%   BMI 22.77 kg/m   Physical Exam Vitals and nursing note reviewed.  Constitutional:      General: She is not in acute distress.    Appearance: She is not toxic-appearing.  HENT:     Head: Normocephalic and atraumatic.     Nose: Nose normal.     Mouth/Throat:     Pharynx: Oropharynx is clear.  Eyes:     Extraocular Movements: Extraocular movements intact.     Conjunctiva/sclera: Conjunctivae normal.  Cardiovascular:     Rate and Rhythm: Normal rate.     Heart sounds: Murmur heard.     Comments: Systolic murmur.  Abdominal:     General: There is no distension.     Palpations: Abdomen is soft.     Tenderness: There is no abdominal tenderness.  Musculoskeletal:     Cervical back: Normal range of motion.  Skin:    General: Skin is warm and dry.  Neurological:     Mental Status: She is alert.     Comments: Oriented to person, president only, not year, place, or reason for ED visit.  Cranial nerves II through XII grossly intact. Strength 5/5 in bilateral upper extremities. Strength 4/5 in right lower extremity, 4/5 in left lower extremity. Sensation intact throughout.     ED Results / Procedures / Treatments   Labs (all labs ordered are listed, but only abnormal results are displayed) Labs Reviewed  CBC WITH DIFFERENTIAL/PLATELET - Abnormal; Notable for the following components:      Result Value   RBC 3.36 (*)    Hemoglobin 8.6 (*)    HCT 29.1 (*)    MCH 25.6 (*)    MCHC 29.6 (*)    All other components within normal limits  URINALYSIS, ROUTINE W REFLEX MICROSCOPIC - Abnormal; Notable for the following components:   APPearance HAZY (*)     Hgb urine dipstick SMALL (*)    Protein, ur 100 (*)    Bacteria, UA MANY (*)    All other components within normal limits  COMPREHENSIVE METABOLIC PANEL - Abnormal; Notable for the following components:   Chloride 112 (*)    Glucose, Bld 121 (*)    Creatinine, Ser 1.27 (*)    Total Protein 5.4 (*)    Albumin 2.5 (*)    GFR, Estimated 42 (*)    All other components within normal limits  AMMONIA - Abnormal; Notable for the following components:   Ammonia 90 (*)  All other components within normal limits  TSH  POC OCCULT BLOOD, ED    EKG EKG Interpretation  Date/Time:  Friday December 19 2021 17:22:26 EST Ventricular Rate:  70 PR Interval:  184 QRS Duration: 64 QT Interval:  358 QTC Calculation: 386 R Axis:   46 Text Interpretation: Normal sinus rhythm with sinus arrhythmia ST & T wave abnormality, consider lateral ischemia Abnormal ECG No significant change since last tracing Confirmed by Deno Etienne [phone]) on 12/19/2021 6:34:15 PM  Radiology No results found.  Procedures Procedures    Medications Ordered in ED Medications  gabapentin (NEURONTIN) capsule 100 mg (has no administration in time range)  levETIRAcetam (KEPPRA) tablet 500 mg (has no administration in time range)  memantine (NAMENDA) tablet 10 mg (has no administration in time range)  mirtazapine (REMERON) tablet 7.5 mg (has no administration in time range)  pantoprazole (PROTONIX) EC tablet 40 mg (has no administration in time range)  amLODipine (NORVASC) tablet 5 mg (has no administration in time range)  lactated ringers bolus 1,000 mL (1,000 mLs Intravenous New Bag/Given 12/19/21 2110)    ED Course/ Medical Decision Making/ A&P Clinical Course as of 12/20/21 0002  Fri Dec 19, 2021  2155 Per facility [facility], she wasn't feeling well today and they wanted her to be checked out. They were unsure of further specifics.  [DG]    Clinical Course User Index [DG] Renard Matter, MD                            Medical Decision Making Problems Addressed: Weakness: complicated acute illness or injury  Amount and/or Complexity of Data Reviewed Independent Historian: EMS    Details: SNF External Data Reviewed: labs and notes. Labs: ordered. Decision-making details documented in ED Course.  Risk Prescription drug management.   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   Initial concern highest for possible anemia secondary to GI bleed given history, potential electrolyte derangements, side effect of Neurontin increased.  Also considered infection though patient is afebrile here and largely denies infectious symptoms, however does report an increased need to urinate so will obtain UA to evaluate for possible UTI. Patient does have a history of hyperammonemia, will obtain ammonia level as well.  Work-up: CBC overall unremarkable, hemoglobin 8.6, which does appear around patient's baseline.  Additionally fecal occult blood negative, and no gross blood on DRE.  No leukocytosis to suggest significant systemic infection.  CMP with creatinine 1.27, baseline appears around here as well.  Patient given 1 L IV fluids in the ED.  Glucose 121, no significant electrolyte derangements.  Total protein and albumin are low.  LFTs within normal limits.  Ammonia 90, however at baseline.  TSH within normal limits.  UA with negative nitrites, negative leukocytes, 0-5 WBCs, many bacteria, overall not convincing for UTI.  I spoke with the patient's skilled nursing facility ([facility] in  [place]) regarding her presentation today.  They stated that the family was concerned about she was not feeling well and wanted her to be seen for evaluation.  They were unable to specify exactly what her symptoms were,  and had not noted anything particularly concerning on their end.  I attempted calling the patient's husband's number two times but was unable to get through.  I also attempted to call the patient's niece's number who is also listed in her chart as a contact, however she also did not answer the phone.  Given reassuring work-up, patient reporting that she feels her normal self and has no concerns, and no particular concerns from SNF, I feel that patient is safe for discharge back to facility at this time.  Facility given information regarding her labs today and instructed to follow-up urine culture.  Symptoms may have been secondary to increased dose of gabapentin.  Patient discharged in stable condition with return precautions.         Final Clinical Impression(s) / ED Diagnoses Final diagnoses:  Weakness    Rx / DC Orders ED Discharge Orders     None         Renard Matter, MD 12/20/21 0002    Deno Etienne, DO 12/22/21 0700

## 2021-12-19 NOTE — ED Notes (Signed)
Patient denies pain and is resting comfortably.  

## 2021-12-20 MED ORDER — PANTOPRAZOLE SODIUM 40 MG PO TBEC: 40.0000 mg | DELAYED_RELEASE_TABLET | Freq: Two times a day (BID) | ORAL | Status: DC

## 2021-12-20 MED ORDER — MIRTAZAPINE 15 MG PO TABS: 7.5000 mg | ORAL_TABLET | Freq: Every day | ORAL | Status: DC

## 2021-12-20 MED ORDER — LEVETIRACETAM 500 MG PO TABS: 500.0000 mg | ORAL_TABLET | Freq: Two times a day (BID) | ORAL | Status: DC

## 2021-12-20 MED ORDER — GABAPENTIN 100 MG PO CAPS: 100.0000 mg | ORAL_CAPSULE | Freq: Two times a day (BID) | ORAL | Status: DC

## 2021-12-20 MED ORDER — MEMANTINE HCL 10 MG PO TABS: 10.0000 mg | ORAL_TABLET | Freq: Two times a day (BID) | ORAL | Status: DC

## 2021-12-20 MED ORDER — AMLODIPINE BESYLATE 5 MG PO TABS: 5.0000 mg | ORAL_TABLET | Freq: Every day | ORAL | Status: DC

## 2021-12-20 NOTE — ED Notes (Signed)
ED Provider at bedside. 

## 2021-12-29 ENCOUNTER — Ambulatory Visit: Payer: Medicare Other | Attending: Cardiology | Admitting: Cardiology

## 2021-12-29 ENCOUNTER — Encounter: Payer: Self-pay | Admitting: Cardiology

## 2021-12-29 VITALS — BP 104/64 | HR 60 | Ht 63.0 in

## 2021-12-29 DIAGNOSIS — I5032 Chronic diastolic (congestive) heart failure: Secondary | ICD-10-CM

## 2021-12-29 DIAGNOSIS — I272 Pulmonary hypertension, unspecified: Secondary | ICD-10-CM

## 2021-12-29 DIAGNOSIS — I3139 Other pericardial effusion (noninflammatory): Secondary | ICD-10-CM

## 2021-12-29 DIAGNOSIS — I10 Essential (primary) hypertension: Secondary | ICD-10-CM | POA: Diagnosis not present

## 2021-12-29 DIAGNOSIS — I350 Nonrheumatic aortic (valve) stenosis: Secondary | ICD-10-CM

## 2021-12-29 NOTE — Patient Instructions (Signed)
Medication Instructions:  Your physician recommends that you continue on your current medications as directed. Please refer to the Current Medication list given to you today.  *If you need a refill on your cardiac medications before your next appointment, please call your pharmacy*  Follow-Up: At [facility], you and your health needs are our priority.  As part of our continuing mission to provide you with exceptional heart care, we have created designated Provider Care Teams.  These Care Teams include your primary Cardiologist (physician) and Advanced Practice Providers (APPs -  Physician Assistants and Nurse Practitioners) who all work together to provide you with the care you need, when you need it.  Your next appointment:   1 year(s)  The format for your next appointment:   In Person  Provider:   Traci Turner, MD     Important Information About Sugar       

## 2021-12-29 NOTE — Progress Notes (Signed)
Date:  12/29/2021   ID:  [name], DOB 05-13-1940, MRN [id number]  PCP:  [name], MD  Cardiologist:  Fransico Him, MD  Electrophysiologist:  None   Chief Complaint:  Chronic diastolic CHF, HTN, HLD  History of Present Illness:    [name] is a 81 y.o. female  with history of Osler-Weber-Rendu's disease, gastric AVMs, chronic iron deficiency anemia requiring frequent transfusions, hypertension, chronic diastolic CHF, HLD, GERD, type 2 diabetes mellitus.  2D echo 03/2017 showed  normal LVEF with mild LVH and grade 2 DD moderate pulmonary hypertension PASP of 57 mmHg.  She  is here today for followup.  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  Due to progressive dementia she was not felt to be a candidate for chemo, surgery or XRT and recommended at some point would need Hospice.  She denies any chest pain or pressure, SOB, DOE, PND, orthopnea, LE edema, dizziness, palpitations or syncope. She is compliant with her meds and is tolerating meds with no SE.     Prior CV studies:   The following studies were reviewed today:  2D echo  Past Medical History:  Diagnosis Date   Chronic anemia    Chronic diastolic CHF (congestive heart failure) ([place]) 10/03/2013   Chronic GI bleeding    /notes 11/29/2014   Family history of anesthesia complication    "niece has a hard time coming out" (09/15/2012)   Frequent nosebleeds    chronic   Gastric AV malformation    /notes 11/29/2014   GERD (gastroesophageal reflux disease)    Heart murmur 04/01/2017   Moderate AVSC on echo 09/2016   Hematemesis 12/04/2017   History of blood transfusion "several"   History of epistaxis    HTN (hypertension), benign 03/02/2012   Hyperlipidemia    Iron deficiency anemia    chronic infusions"   Lichen planus    Both lower extremities   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[place])    [name] 11/29/2014   Overgrown toenails 03/18/2017   Pneumonia 1990's X 2   Pulmonary HTN ([place])  04/01/2017   PASP 39mHg on echo 09/2016 and 478mg by echo 2019   Seizures ([place]/2016   Symptomatic anemia 11/29/2014   Telangiectasia    Gastric    Type II diabetes mellitus ([facility]   insulin requiring.   Past Surgical History:  Procedure Laterality Date   CATARACT EXTRACTION     "I think it was just one eye"   ESOPHAGOGASTRODUODENOSCOPY  02/26/2011   Procedure: ESOPHAGOGASTRODUODENOSCOPY (EGD);  Surgeon: [name];  [facility];  Service: Endoscopy;  Laterality: N/A;   ESOPHAGOGASTRODUODENOSCOPY N/A 11/08/2012   Procedure: ESOPHAGOGASTRODUODENOSCOPY (EGD);  Surgeon: [name];  [facility];  Service: Endoscopy;  Laterality: N/A;   ESOPHAGOGASTRODUODENOSCOPY N/A 10/04/2013   Procedure: ESOPHAGOGASTRODUODENOSCOPY (EGD);  Surgeon: [name];  [facility];  Service: Endoscopy;  Laterality: N/A;  with APC on stand-by   ESOPHAGOGASTRODUODENOSCOPY N/A 07/06/2014   Procedure: ESOPHAGOGASTRODUODENOSCOPY (EGD);  Surgeon: [name];  [facility];  Service: Endoscopy;  Laterality: N/A;   ESOPHAGOGASTRODUODENOSCOPY N/A 09/05/2014   Procedure: ESOPHAGOGASTRODUODENOSCOPY (EGD);  Surgeon: JaLaurence SpatesMD;  [facility];  Service: Endoscopy;  Laterality: N/A;  APC on standby to control bleeding   ESOPHAGOGASTRODUODENOSCOPY N/A 11/29/2014   Procedure: ESOPHAGOGASTRODUODENOSCOPY (EGD);  Surgeon: [name];  [facility];  Service: Endoscopy;  Laterality: N/A;  ESOPHAGOGASTRODUODENOSCOPY N/A 09/28/2015   Procedure: ESOPHAGOGASTRODUODENOSCOPY (EGD);  Surgeon: Clarene Essex, MD;  Location[facility]COPY;  Service: Endoscopy;  Laterality: N/A;   ESOPHAGOGASTRODUODENOSCOPY N/A 11/22/2021   Procedure: ESOPHAGOGASTRODUODENOSCOPY (EGD);  Surgeon: Loney Laurence, DO;  [name];  Service: Gastroenterology;  Laterality: N/A;   ESOPHAGOGASTRODUODENOSCOPY (EGD) WITH PROPOFOL N/A 12/04/2017    Procedure: ESOPHAGOGASTRODUODENOSCOPY (EGD) WITH PROPOFOL;  Surgeon: [name], MD;  Location: [place];  Service: Endoscopy;  Laterality: N/A;   ESOPHAGOGASTRODUODENOSCOPY (EGD) WITH PROPOFOL N/A 12/24/2020   Procedure: ESOPHAGOGASTRODUODENOSCOPY (EGD) WITH PROPOFOL;  Surgeon: Arta Silence, MD;  Location: [place];  Service: Endoscopy;  Laterality: N/A;   ESOPHAGOGASTRODUODENOSCOPY (EGD) WITH PROPOFOL N/A 01/19/2021   Procedure: ESOPHAGOGASTRODUODENOSCOPY (EGD) WITH PROPOFOL;  Surgeon: [name], MD;  Location: [place];  Service: Endoscopy;  Laterality: N/A;   ESOPHAGOGASTRODUODENOSCOPY (EGD) WITH PROPOFOL N/A 02/06/2021   Procedure: ESOPHAGOGASTRODUODENOSCOPY (EGD) WITH PROPOFOL;  Surgeon: Ronnette Juniper, MD;  Location: [place];  Service: Gastroenterology;  Laterality: N/A;   ESOPHAGOGASTRODUODENOSCOPY ENDOSCOPY  08/19/2006   with laser treatment   HEMOSTASIS CLIP PLACEMENT  12/24/2020   Procedure: HEMOSTASIS CLIP PLACEMENT;  Surgeon: Arta Silence, MD;  Location[facility] END[facility];  Service: Endoscopy;;   HEMOSTASIS CLIP PLACEMENT  01/19/2021   Procedure: HEMOSTASIS CLIP PLACEMENT;  Surgeon: [name], MD;  [facility]COPY;  Service: Endoscopy;;   HEMOSTASIS CLIP PLACEMENT  11/22/2021   Procedure: HEMOSTASIS CLIP PLACEMENT;  Surgeon: Loney Laurence, DO;  [facility]: [name];  Service: Gastroenterology;;   HOT HEMOSTASIS N/A 07/06/2014   Procedure: HOT HEMOSTASIS (ARGON PLASMA COAGULATION/BICAP);  Surgeon: Clarene Essex, MD;  [name];  Service: Endoscopy;  Laterality: N/A;   HOT HEMOSTASIS N/A 09/28/2015   Procedure: HOT HEMOSTASIS (ARGON PLASMA COAGULATION/BICAP);  Surgeon: Clarene Essex, MD;  [facility];  Service: Endoscopy;  Laterality: N/A;   HOT HEMOSTASIS N/A 12/04/2017   Procedure: HOT HEMOSTASIS (ARGON PLASMA COAGULATION/BICAP);  Surgeon: [name], MD;  Location: [name];  Service: Endoscopy;   Laterality: N/A;   HOT HEMOSTASIS  11/22/2021   Procedure: HOT HEMOSTASIS (ARGON PLASMA COAGULATION/BICAP);  Surgeon: Loney Laurence, DO;  [name];  Service: Gastroenterology;;   Chrystine Oiler SELECTIVE EACH ADDITIONAL VESSEL  02/05/2021   IR ANGIOGRAM VISCERAL SELECTIVE  02/05/2021   IR EMBO ART  VEN HEMORR LYMPH EXTRAV  INC GUIDE ROADMAPPING  02/05/2021   IR US GUIDE VASC ACCESS RIGHT  02/05/2021   NASAL HEMORRHAGE CONTROL     "for bleeding"    SAVORY DILATION  02/26/2011   Procedure: SAVORY DILATION;  Surgeon: [name], MD;  [name];  Service: Endoscopy;  Laterality: N/A;  c-arm needed   SCLEROTHERAPY  12/24/2020   Procedure: SCLEROTHERAPY;  Surgeon: Arta Silence, MD;  Location: [facility] ENDOSCOPY;  Service: Endoscopy;;   SCLEROTHERAPY  01/19/2021   Procedure: Clide Deutscher;  Surgeon: [name], MD;  Location: [place];  Service: Endoscopy;;   SUBMUCOSAL INJECTION  12/04/2017   Procedure: SUBMUCOSAL INJECTION;  Surgeon: [name], MD;  Location: [place];  Service: Endoscopy;;     Current Meds  Medication Sig   amLODipine (NORVASC) 5 MG tablet Take 1 tablet (5 mg total) by mouth daily.   bisacodyl (DULCOLAX) 10 MG suppository Place 10 mg rectally as needed for moderate constipation.   diclofenac Sodium (VOLTAREN) 1 % GEL Apply 2 g topically 4 (four) times daily.   gabapentin (NEURONTIN) 100 MG capsule Take 1 capsule (100 mg total) by mouth in the morning AND 2  capsules (200 mg total) at bedtime.   guaiFENesin (ROBITUSSIN) 100 MG/5ML liquid Take 200 mg by mouth every 4 (four) hours as needed for cough.   insulin lispro (HUMALOG KWIKPEN) 100 UNIT/ML KwikPen Inject 3 Units into the skin 3 (three) times daily.   lactulose (CHRONULAC) 10 GM/15ML solution Take 15 mLs by mouth in the morning, at noon, and at bedtime.   LANTUS SOLOSTAR 100 UNIT/ML Solostar Pen Inject 10 Units into the skin at bedtime.   levETIRAcetam (KEPPRA) 500 MG  tablet Take 1 tablet (500 mg total) by mouth 2 (two) times daily.   Magnesium Hydroxide (MILK OF MAGNESIA PO) Take 30 mLs by mouth every evening.   memantine (NAMENDA) 10 MG tablet TAKE 1 TABLET BY MOUTH TWICE DAILY   Multiple Vitamins-Minerals (MULTI FOR HER 50+) TABS Take 1 tablet by mouth daily with breakfast.   pantoprazole (PROTONIX) 40 MG tablet Take 1 tablet (40 mg total) by mouth 2 (two) times daily.   torsemide (DEMADEX) 20 MG tablet Take 1 tablet (20 mg total) by mouth as needed (please take 1 '20mg'$  tablet as needed for weight gain of 2 pounds overnight or 5 pounds in a week).   triamcinolone cream (KENALOG) 0.1 % Apply 1 Application topically 2 (two) times daily.   TRULICITY 3 CW/8.8QB SOPN Inject 3 mg into the skin once a week.     Allergies:   Aspirin   Social History   Tobacco Use   Smoking status: Former    Packs/day: 1.00    Years: 20.00    Total pack years: 20.00    Types: Cigarettes    Quit date: 02/10/1971    Years since quitting: 50.9   Smokeless tobacco: Never   Tobacco comments:    09/15/2012 "smoked 50-60 yr ago"  Vaping Use   Vaping Use: Never used  Substance Use Topics   Alcohol use: No    Alcohol/week: 0.0 standard drinks of alcohol   Drug use: No     Family Hx: The patient's family history includes Breast cancer in an other family member; Diabetes in her niece; Healthy in her mother; Stroke in her father. There is no history of Malignant hyperthermia, Seizures, or Lung cancer.  ROS:   Please see the history of present illness.     All other systems reviewed and are negative.   Labs/Other Tests and Data Reviewed:    Recent Labs: 11/21/2021: B Natriuretic Peptide 436.3 11/22/2021: Magnesium 2.0 12/19/2021: ALT 11; BUN 15; Creatinine, Ser 1.27; Hemoglobin 8.6; Platelets 207; Potassium 4.3; Sodium 144; TSH 2.127   Recent Lipid Panel Lab Results  Component Value Date/Time   CHOL 133 01/30/2019 04:56 PM   TRIG 176.0 (H) 01/30/2019 04:56 PM   HDL  41.30 01/30/2019 04:56 PM   CHOLHDL 3 01/30/2019 04:56 PM   LDLCALC 57 01/30/2019 04:56 PM   LDLDIRECT 148 (H) 03/05/2021 02:33 PM    Wt Readings from Last 3 Encounters:  12/19/21 128 lb 8.5 oz (58.3 kg)  11/22/21 128 lb 8.5 oz (58.3 kg)  11/01/21 121 lb 4.1 oz (55 kg)     Objective:    Vital Signs:  BP 104/64   Pulse 60   Ht '5\' 3"'$  (1.6 m)   LMP  (LMP Unknown)   SpO2 98%   BMI 22.77 kg/m    GEN: Well nourished, well developed in no acute distress HEENT: Normal NECK: No JVD; No carotid bruits LYMPHATICS: No lymphadenopathy CARDIAC:RRR, no  rubs, gallops.  2/6 SM at RUSB  to LLSB RESPIRATORY:  few scant crackles at bases ABDOMEN: Soft, non-tender, non-distended MUSCULOSKELETAL:  No edema; No deformity  SKIN: Warm and dry NEUROLOGIC:  Alert and oriented x 3 PSYCHIATRIC:  Normal affect  ASSESSMENT & PLAN:    1.  Chronic diastolic CHF  -She Appears Euvolemic on Exam Today -continue prescription drug management with Torsemide '20mg'$  daily with PRn refills -I have personally reviewed and interpreted outside labs performed by patient's PCP which showed SCr 1.27 and K+ 4.3 on 12/19/2021  2.  Moderate pulmonary HTN  - 2D echo 03/2017 showed a PASP of 47mHg which improved from echo in 2018.   - repeat echo 8/22 showed PASP 569mg - CRP was elevated at 21 and sed rate 59 - PFTs showed severe COPD>>followed by pulmonary - no PE on VQ scan - echo done 10/23 was stopped due to patient not cooperative>>given progressive dementia, poor functional status and probable lung CA (not candidate for any Rx) would not recommend any further yearly screening echo  3.  Hypertension  -  BP controlled on exam today - continue prescription drug management with amlodipine '5mg'$  daily with PRN refills  4.  Aortic stenosis -mild on echo 8/22 -Patient was uncooperative on echo 11/2021 due to progressive dementia -she would not be a candidate for any valve surgery if her AS progressed given her  progressive dementia, poor functional status and likely lung CA so will not repeat any further echoes  5.  Pericardial effusion -mild to moderate on last echo -small by echo 10/23  6.  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  Tests Ordered: No orders of the defined types were placed in this encounter.  Medication Changes: No orders of the defined types were placed in this encounter.   Disposition:  Follow up 6 months  Signed, TrFransico HimMD  12/29/2021 11:24 AM    [facility]

## 2022-01-02 ENCOUNTER — Non-Acute Institutional Stay: Payer: Medicare Other | Admitting: Hospice

## 2022-01-02 DIAGNOSIS — I5032 Chronic diastolic (congestive) heart failure: Secondary | ICD-10-CM

## 2022-01-02 DIAGNOSIS — Z515 Encounter for palliative care: Secondary | ICD-10-CM

## 2022-01-02 DIAGNOSIS — E119 Type 2 diabetes mellitus without complications: Secondary | ICD-10-CM

## 2022-01-02 DIAGNOSIS — F0390 Unspecified dementia without behavioral disturbance: Secondary | ICD-10-CM

## 2022-01-02 NOTE — Progress Notes (Addendum)
[place] Consult Note Telephone: [phone]  Fax: [phone]  PATIENT NAME: [name] [address] [place] [address] [phone] (home)  DOB: 10/25/1940 MRN: [id number]  PRIMARY CARE PROVIDER:    [name], MD,  [place] [address] [phone]  REFERRING PROVIDER:  Lavone Neri, NP   RESPONSIBLE PARTY:   Spouse [phone] c Contact: [name] [phone], [name]     Name Relation Home Work Mobile   [name],[name] Niece   [phone]   [name], [name] [phone]  [phone]      I met face to face with patient and at facility. Palliative Care was asked to follow this patient by consultation request of  [name] Binder NP to address advance care planning, complex medical decision making and goals of care clarification.  NP called [name], left her a voicemail with call back number.     ASSESSMENT AND / RECOMMENDATIONS:   CODE STATUS: Patient is a Full code  Goals of Care: Goals include to maximize quality of life and symptom management  Symptom Management/Plan: Dementia:  Encourage reminiscence, word search/puzzles, cueing for recollection.  Promote calm approach and engaging environment.  Safety and fall precautions.  Continue  memantine as currently ordered; incontinent of bowel and bladder FAST 6D.  Type 2 DM: Stable A1c 6.17 Dec 7339 Continue Trulicity, Lantus as ordered. No concentrated sweets. Diabetic diet, no empty caloric beverages. Check CBG as ordered to help ensure tight control.  Repeat A1c in 3 to 6 months. CFH: Continue Torsemide as ordered. Elevate BLE during the day as much as possible to promote circulation. Adhere to Fluid and salt limits.  Follow up: Palliative care will continue to follow for complex medical decision making, advance care planning, and clarification of goals. Return 6 weeks or prn. Encouraged to call provider  sooner with any concerns.   Family /Caregiver/Community Supports: Patient is in SNF for ongoing care  HOSPICE ELIGIBILITY/DIAGNOSIS: TBD  Chief Complaint: Follow up visit  HISTORY OF PRESENT ILLNESS:  [name] is a 81 y.o. year old female  with multiple morbidities requiring close monitoring/management with high risk of complications and morbidity: Advanced Dementia,Type 2 DM. CHF, Anemia.  Patient denies pain/discomfort. History obtained from review of EMR, discussion with primary team, caregiver, family and/or [name].  Review and summarization of Epic records shows history from other than patient. Rest of 10 point ROS asked and negative.  I reviewed as needed, available labs, patient records, imaging, studies and related documents from the EMR.   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   Fatigue 07/10/2011   Type 2 diabetes mellitus ([place]) 09/15/2012   Chronic diastolic CHF (congestive heart failure) ([place]) 10/03/2013   Essential hypertension 03/02/2012   CKD (chronic kidney disease), stage III ([place])    Simple febrile convulsions ([place]) 10/21/2014   Dementia ([place]) 01/30/2015   Healthcare maintenance 06/20/2015   Seborrheic keratosis 06/20/2015   PAF (paroxysmal atrial fibrillation) ([place]) 09/29/2015   Pain in joint of right shoulder 10/22/2015   Peripheral vascular disease ([place]) 12/30/2015   Dry skin 02/17/2016   Venous insufficiency of both lower extremities 04/27/2016   Epistaxis 07/30/2016   Osteopenia after menopause 01/13/2017   Overgrown toenails 03/18/2017   Pulmonary HTN ([place]) 04/01/2017   Heart murmur 04/01/2017  Lower extremity pain, bilateral 07/15/2017   Pain localized to upper abdomen 10/05/2017   Leg weakness, bilateral 12/23/2017   Hyperpigmented skin lesion 02/07/2018   Iron deficiency anemia due to chronic blood loss 04/19/2018    Port-A-Cath in place 05/23/2018   Neuropathy 07/27/2019   Seizures ([place]) 09/2014   Hearing loss 05/29/2020   Bullous lesion 10/08/2020   History of GI bleed 01/18/2021   Resolved Ambulatory Problems    Diagnosis Date Noted   Type 2 diabetes mellitus with complication, with long-term current use of insulin ([place]) 04/08/2006   HYPERLIPIDEMIA 04/08/2006   Anemia, iron deficiency 04/21/2006   Iron deficiency anemia due to chronic blood loss 04/08/2006   HYPERTENSION, BENIGN SYSTEMIC 04/08/2006   SYMPTOM, EPISTAXIS 05/21/2006   Anemia 02/01/2011   GI bleed 02/01/2011   Hypokalemia 02/01/2011   Obesity 09/15/2012   Abnormal CXR 09/15/2012   Dyspnea 10/03/2013   Acute on chronic heart failure with preserved ejection fraction (HFpEF) ([place]) 10/03/2013   Upper GI bleed 07/05/2014   Poor venous access    SOB (shortness of breath) 07/27/2014   Acute kidney injury ([place]) 08/27/2014   Acute encephalopathy 08/27/2014   Acute renal failure superimposed on stage 3 chronic kidney disease ([place]) 08/27/2014   Anemia of chronic disease 08/27/2014   UTI (lower urinary tract infection) 08/27/2014   Anemia 09/04/2014   Acute lower GI bleeding 09/04/2014   Acute blood loss anemia    Hyperammonemia ([name] 10/03/2014   Status epilepticus ([place]) 10/03/2014   Hypotension 11/13/2014   Symptomatic anemia 11/29/2014   Bleeding gastrointestinal    SOB (shortness of breath) 97/41/6384   Lichen planus 53/64/6803   Acute GI bleeding 09/28/2015   Acute on chronic blood loss anemia    Angiodysplasia of stomach with hemorrhage    AVM (arteriovenous malformation) of stomach, acquired with hemorrhage    Hyperglycemia due to type 2 diabetes mellitus ([place]) 05/21/2016   Muscle spasm of both lower legs 06/15/2016   Hematemesis 12/04/2017   Acute right hip pain 03/10/2018   Hyperkalemia 10/18/2018   Hypervolemia due to congestive heart failure ([place]) 08/02/2019   Abnormal LFTs 08/09/2019   Impacted cerumen of left  ear 07/15/2020   Hypoglycemia 09/29/2020   Acute upper GI bleed 12/23/2020   Hemorrhagic shock ([name]    Altered mental status 21/22/4825   Acute metabolic encephalopathy 00/37/0488   AKI (acute kidney injury) ([place])    Dehydration    AKI (acute kidney injury) ([place]) 01/28/2021   Altered mental status 02/03/2021   AMS (altered mental status) 02/04/2021   Acute upper GI bleed 11/21/2021   Past Medical History:  Diagnosis Date   Chronic anemia    Chronic GI bleeding    Family history of anesthesia complication    Frequent nosebleeds    Gastric AV malformation    GERD (gastroesophageal reflux disease)    History of blood transfusion "several"   History of epistaxis    HTN (hypertension), benign 03/02/2012   Hyperlipidemia    Iron deficiency anemia    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[place])    Pneumonia 1990's X 2   Telangiectasia    Type II diabetes mellitus ([place])     SOCIAL HX:  Social History   Tobacco Use   Smoking status: Former    Packs/day: 1.00    Years: 20.00    Total pack years: 20.00    Types: Cigarettes    Quit date: 02/10/1971    Years since quitting: 50.9   Smokeless tobacco: Never  Tobacco comments:    09/15/2012 "smoked 50-60 yr ago"  Substance Use Topics   Alcohol use: No    Alcohol/week: 0.0 standard drinks of alcohol     FAMILY HX:  Family History  Problem Relation Age of Onset   Healthy Mother    Stroke Father    Diabetes Niece    Breast cancer Other    Malignant hyperthermia Neg Hx    Seizures Neg Hx    Lung cancer Neg Hx       ALLERGIES:  Allergies  Allergen Reactions   Aspirin Nausea And Vomiting      PERTINENT MEDICATIONS:  Outpatient Encounter Medications as of 01/02/2022  Medication Sig   amLODipine (NORVASC) 5 MG tablet Take 1 tablet (5 mg total) by mouth daily.   bisacodyl (DULCOLAX) 10 MG suppository Place 10 mg rectally as needed for moderate constipation.   diclofenac Sodium (VOLTAREN) 1 % GEL Apply 2 g topically 4 (four) times  daily.   gabapentin (NEURONTIN) 100 MG capsule Take 1 capsule (100 mg total) by mouth in the morning AND 2 capsules (200 mg total) at bedtime.   guaiFENesin (ROBITUSSIN) 100 MG/5ML liquid Take 200 mg by mouth every 4 (four) hours as needed for cough.   insulin lispro (HUMALOG KWIKPEN) 100 UNIT/ML KwikPen Inject 3 Units into the skin 3 (three) times daily.   lactulose (CHRONULAC) 10 GM/15ML solution Take 15 mLs by mouth in the morning, at noon, and at bedtime.   LANTUS SOLOSTAR 100 UNIT/ML Solostar Pen Inject 10 Units into the skin at bedtime.   levETIRAcetam (KEPPRA) 500 MG tablet Take 1 tablet (500 mg total) by mouth 2 (two) times daily.   Magnesium Hydroxide (MILK OF MAGNESIA PO) Take 30 mLs by mouth every evening.   memantine (NAMENDA) 10 MG tablet TAKE 1 TABLET BY MOUTH TWICE DAILY   Multiple Vitamins-Minerals (MULTI FOR HER 50+) TABS Take 1 tablet by mouth daily with breakfast.   pantoprazole (PROTONIX) 40 MG tablet Take 1 tablet (40 mg total) by mouth 2 (two) times daily.   torsemide (DEMADEX) 20 MG tablet Take 1 tablet (20 mg total) by mouth as needed (please take 1 17m tablet as needed for weight gain of 2 pounds overnight or 5 pounds in a week).   triamcinolone cream (KENALOG) 0.1 % Apply 1 Application topically 2 (two) times daily.   TRULICITY 3 MEV/2.0QVSOPN Inject 3 mg into the skin once a week.   Facility-Administered Encounter Medications as of 01/02/2022  Medication   sodium chloride 0.9 % injection 10 mL   sodium chloride flush (NS) 0.9 % injection 10 mL   I spent 45 minutes providing this consultation; time includes spent with patient/family, chart review and documentation. More than 50% of the time in this consultation was spent on care coordination  Thank you for the opportunity to participate in the care of [name].  The palliative care team will continue to follow. Please call our office at [phone] we can be of additional assistance.   Note: Portions of this  note were generated with DLobbyist Dictation errors may occur despite best attempts at proofreading.  LTeodoro Spray NP

## 2022-01-05 ENCOUNTER — Other Ambulatory Visit: Payer: Self-pay

## 2022-01-05 ENCOUNTER — Other Ambulatory Visit: Payer: Medicare Other

## 2022-01-05 DIAGNOSIS — I780 Hereditary hemorrhagic telangiectasia: Secondary | ICD-10-CM

## 2022-01-05 DIAGNOSIS — D500 Iron deficiency anemia secondary to blood loss (chronic): Secondary | ICD-10-CM

## 2022-01-09 ENCOUNTER — Other Ambulatory Visit: Payer: Self-pay | Admitting: Medical Oncology

## 2022-01-09 ENCOUNTER — Other Ambulatory Visit: Payer: Self-pay

## 2022-01-09 ENCOUNTER — Inpatient Hospital Stay: Payer: Medicare Other

## 2022-01-09 ENCOUNTER — Other Ambulatory Visit: Payer: Medicare Other

## 2022-01-09 ENCOUNTER — Inpatient Hospital Stay: Payer: Medicare Other | Attending: Hematology

## 2022-01-09 VITALS — BP 148/53 | HR 69 | Temp 98.0°F | Resp 18

## 2022-01-09 DIAGNOSIS — Z5111 Encounter for antineoplastic chemotherapy: Secondary | ICD-10-CM | POA: Diagnosis not present

## 2022-01-09 DIAGNOSIS — I780 Hereditary hemorrhagic telangiectasia: Secondary | ICD-10-CM | POA: Diagnosis present

## 2022-01-09 DIAGNOSIS — I130 Hypertensive heart and chronic kidney disease with heart failure and stage 1 through stage 4 chronic kidney disease, or unspecified chronic kidney disease: Secondary | ICD-10-CM | POA: Diagnosis not present

## 2022-01-09 DIAGNOSIS — E1122 Type 2 diabetes mellitus with diabetic chronic kidney disease: Secondary | ICD-10-CM | POA: Diagnosis not present

## 2022-01-09 DIAGNOSIS — R911 Solitary pulmonary nodule: Secondary | ICD-10-CM | POA: Insufficient documentation

## 2022-01-09 DIAGNOSIS — D500 Iron deficiency anemia secondary to blood loss (chronic): Secondary | ICD-10-CM | POA: Diagnosis not present

## 2022-01-09 DIAGNOSIS — N183 Chronic kidney disease, stage 3 unspecified: Secondary | ICD-10-CM | POA: Diagnosis not present

## 2022-01-09 DIAGNOSIS — K31819 Angiodysplasia of stomach and duodenum without bleeding: Secondary | ICD-10-CM

## 2022-01-09 DIAGNOSIS — I5032 Chronic diastolic (congestive) heart failure: Secondary | ICD-10-CM | POA: Diagnosis not present

## 2022-01-09 DIAGNOSIS — Z95828 Presence of other vascular implants and grafts: Secondary | ICD-10-CM

## 2022-01-09 LAB — CBC WITH DIFFERENTIAL (CANCER CENTER ONLY)
Abs Immature Granulocytes: 0.02 10*3/uL (ref 0.00–0.07)
Basophils Absolute: 0 10*3/uL (ref 0.0–0.1)
Basophils Relative: 1 %
Eosinophils Absolute: 0.4 10*3/uL (ref 0.0–0.5)
Eosinophils Relative: 8 %
HCT: 29.6 % — ABNORMAL LOW (ref 36.0–46.0)
Hemoglobin: 8.9 g/dL — ABNORMAL LOW (ref 12.0–15.0)
Immature Granulocytes: 0 %
Lymphocytes Relative: 15 %
Lymphs Abs: 0.8 10*3/uL (ref 0.7–4.0)
MCH: 24.3 pg — ABNORMAL LOW (ref 26.0–34.0)
MCHC: 30.1 g/dL (ref 30.0–36.0)
MCV: 80.9 fL (ref 80.0–100.0)
Monocytes Absolute: 0.5 10*3/uL (ref 0.1–1.0)
Monocytes Relative: 9 %
Neutro Abs: 3.6 10*3/uL (ref 1.7–7.7)
Neutrophils Relative %: 67 %
Platelet Count: 239 10*3/uL (ref 150–400)
RBC: 3.66 MIL/uL — ABNORMAL LOW (ref 3.87–5.11)
RDW: 16.5 % — ABNORMAL HIGH (ref 11.5–15.5)
WBC Count: 5.4 10*3/uL (ref 4.0–10.5)
nRBC: 0 % (ref 0.0–0.2)

## 2022-01-09 LAB — FERRITIN: Ferritin: 26 ng/mL (ref 11–307)

## 2022-01-09 LAB — TYPE AND SCREEN
ABO/RH(D): A POS
Antibody Screen: NEGATIVE

## 2022-01-09 MED ORDER — HEPARIN SOD (PORK) LOCK FLUSH 100 UNIT/ML IV SOLN
500.0000 [IU] | Freq: Once | INTRAVENOUS | Status: AC | PRN
  Administered 2022-01-09: 500 [IU]

## 2022-01-09 MED ORDER — SODIUM CHLORIDE 0.9 % IV SOLN: Freq: Once | INTRAVENOUS | Status: AC

## 2022-01-09 MED ORDER — SODIUM CHLORIDE 0.9 % IV SOLN
510.0000 mg | Freq: Once | INTRAVENOUS | Status: AC
  Administered 2022-01-09: 510 mg via INTRAVENOUS
  Filled 2022-01-09: qty 510

## 2022-01-09 MED ORDER — SODIUM CHLORIDE 0.9% FLUSH
10.0000 mL | INTRAVENOUS | Status: DC | PRN
  Administered 2022-01-09: 10 mL

## 2022-01-09 MED ORDER — ALTEPLASE 2 MG IJ SOLR
2.0000 mg | Freq: Once | INTRAMUSCULAR | Status: AC | PRN
  Administered 2022-01-09: 2 mg
  Filled 2022-01-09: qty 2

## 2022-01-09 MED ORDER — SODIUM CHLORIDE 0.9 % IV SOLN
4.8000 mg/kg | Freq: Once | INTRAVENOUS | Status: AC
  Administered 2022-01-09: 300 mg via INTRAVENOUS
  Filled 2022-01-09: qty 12

## 2022-01-09 NOTE — Progress Notes (Signed)
Proceed with treatment without urine protein per Dr. Burr Medico

## 2022-01-09 NOTE — Patient Instructions (Signed)
[facility]  Discharge Instructions: Thank you for choosing [facility] to provide your oncology and hematology care.   If you have a lab appointment with the [facility], please go directly to the [facility] and check in at the registration area.   Wear comfortable clothing and clothing appropriate for easy access to any Portacath or PICC line.   We strive to give you quality time with your provider. You may need to reschedule your appointment if you arrive late (15 or more minutes).  Arriving late affects you and other patients whose appointments are after yours.  Also, if you miss three or more appointments without notifying the office, you may be dismissed from the clinic at the provider's discretion.      For prescription refill requests, have your pharmacy contact our office and allow 72 hours for refills to be completed.    Today you received the following chemotherapy and/or immunotherapy agents: Bevacizumab.       To help prevent nausea and vomiting after your treatment, we encourage you to take your nausea medication as directed.  BELOW ARE SYMPTOMS THAT SHOULD BE REPORTED IMMEDIATELY: *FEVER GREATER THAN 100.4 F (38 C) OR HIGHER *CHILLS OR SWEATING *NAUSEA AND VOMITING THAT IS NOT CONTROLLED WITH YOUR NAUSEA MEDICATION *UNUSUAL SHORTNESS OF BREATH *UNUSUAL BRUISING OR BLEEDING *URINARY PROBLEMS (pain or burning when urinating, or frequent urination) *BOWEL PROBLEMS (unusual diarrhea, constipation, pain near the anus) TENDERNESS IN MOUTH AND THROAT WITH OR WITHOUT PRESENCE OF ULCERS (sore throat, sores in mouth, or a toothache) UNUSUAL RASH, SWELLING OR PAIN  UNUSUAL VAGINAL DISCHARGE OR ITCHING   Items with * indicate a potential emergency and should be followed up as soon as possible or go to the Emergency Department if any problems should occur.  Please show the CHEMOTHERAPY ALERT CARD or IMMUNOTHERAPY ALERT CARD at check-in  to the Emergency Department and triage nurse.  Should you have questions after your visit or need to cancel or reschedule your appointment, please contact [facility]  Dept: [phone]  and follow the prompts.  Office hours are 8:00 a.m. to 4:30 p.m. Monday - Friday. Please note that voicemails left after 4:00 p.m. may not be returned until the following business day.  We are closed weekends and major holidays. You have access to a nurse at all times for urgent questions. Please call the main number to the clinic Dept: [phone] and follow the prompts.   For any non-urgent questions, you may also contact your provider using MyChart. We now offer e-Visits for anyone 10 and older to request care online for non-urgent symptoms. For details visit [email or web address].   Also download the MyChart app! Go to the app store, search "MyChart", open the app, select [facility], and log in with your MyChart username and password.  Masks are optional in the cancer centers. If you would like for your care team to wear a mask while they are taking care of you, please let them know. You may have one support person who is at least 81 years old accompany you for your appointments. Iron Sucrose Injection What is this medication? IRON SUCROSE (EYE ern SOO krose) treats low levels of iron (iron deficiency anemia) in people with kidney disease. Iron is a mineral that plays an important role in making red blood cells, which carry oxygen from your lungs to the rest of your body. This medicine may be used for other purposes; ask  your health care provider or pharmacist if you have questions. COMMON BRAND NAME(S): Venofer What should I tell my care team before I take this medication? They need to know if you have any of these conditions: Anemia not caused by low iron levels Heart disease High levels of iron in the blood Kidney disease Liver disease An unusual or allergic reaction to  iron, other medications, foods, dyes, or preservatives Pregnant or trying to get pregnant Breastfeeding How should I use this medication? This medication is for infusion into a vein. It is given in a hospital or clinic setting. Talk to your care team about the use of this medication in children. While this medication may be prescribed for children as young as 2 years for selected conditions, precautions do apply. Overdosage: If you think you have taken too much of this medicine contact a poison control center or emergency room at once. NOTE: This medicine is only for you. Do not share this medicine with others. What if I miss a dose? Keep appointments for follow-up doses. It is important not to miss your dose. Call your care team if you are unable to keep an appointment. What may interact with this medication? Do not take this medication with any of the following: Deferoxamine Dimercaprol Other iron products This medication may also interact with the following: Chloramphenicol Deferasirox This list may not describe all possible interactions. Give your health care provider a list of all the medicines, herbs, non-prescription drugs, or dietary supplements you use. Also tell them if you smoke, drink alcohol, or use illegal drugs. Some items may interact with your medicine. What should I watch for while using this medication? Visit your care team regularly. Tell your care team if your symptoms do not start to get better or if they get worse. You may need blood work done while you are taking this medication. You may need to follow a special diet. Talk to your care team. Foods that contain iron include: whole grains/cereals, dried fruits, beans, or peas, leafy green vegetables, and organ meats (liver, kidney). What side effects may I notice from receiving this medication? Side effects that you should report to your care team as soon as possible: Allergic reactions--skin rash, itching, hives,  swelling of the face, lips, tongue, or throat Low blood pressure--dizziness, feeling faint or lightheaded, blurry vision Shortness of breath Side effects that usually do not require medical attention (report to your care team if they continue or are bothersome): Flushing Headache Joint pain Muscle pain Nausea Pain, redness, or irritation at injection site This list may not describe all possible side effects. Call your doctor for medical advice about side effects. You may report side effects to FDA at 1-800-FDA-1088. Where should I keep my medication? This medication is given in a hospital or clinic and will not be stored at home. NOTE: This sheet is a summary. It may not cover all possible information. If you have questions about this medicine, talk to your doctor, pharmacist, or health care provider.  2023 Elsevier/Gold Standard (2020-05-09 00:00:00)

## 2022-01-15 ENCOUNTER — Encounter: Payer: Self-pay | Admitting: Hematology

## 2022-01-16 ENCOUNTER — Inpatient Hospital Stay: Payer: Medicare Other

## 2022-01-16 ENCOUNTER — Other Ambulatory Visit: Payer: Self-pay

## 2022-01-16 VITALS — BP 161/60 | HR 61 | Temp 97.6°F | Resp 18

## 2022-01-16 DIAGNOSIS — K31819 Angiodysplasia of stomach and duodenum without bleeding: Secondary | ICD-10-CM

## 2022-01-16 DIAGNOSIS — Z95828 Presence of other vascular implants and grafts: Secondary | ICD-10-CM

## 2022-01-16 DIAGNOSIS — K31811 Angiodysplasia of stomach and duodenum with bleeding: Secondary | ICD-10-CM

## 2022-01-16 DIAGNOSIS — Z8719 Personal history of other diseases of the digestive system: Secondary | ICD-10-CM

## 2022-01-16 DIAGNOSIS — D500 Iron deficiency anemia secondary to blood loss (chronic): Secondary | ICD-10-CM

## 2022-01-16 DIAGNOSIS — D509 Iron deficiency anemia, unspecified: Secondary | ICD-10-CM

## 2022-01-16 DIAGNOSIS — I780 Hereditary hemorrhagic telangiectasia: Secondary | ICD-10-CM

## 2022-01-16 DIAGNOSIS — Z5111 Encounter for antineoplastic chemotherapy: Secondary | ICD-10-CM | POA: Diagnosis not present

## 2022-01-16 LAB — CBC WITH DIFFERENTIAL (CANCER CENTER ONLY)
Abs Immature Granulocytes: 0.02 10*3/uL (ref 0.00–0.07)
Basophils Absolute: 0 10*3/uL (ref 0.0–0.1)
Basophils Relative: 0 %
Eosinophils Absolute: 0.3 10*3/uL (ref 0.0–0.5)
Eosinophils Relative: 6 %
HCT: 32.2 % — ABNORMAL LOW (ref 36.0–46.0)
Hemoglobin: 9.6 g/dL — ABNORMAL LOW (ref 12.0–15.0)
Immature Granulocytes: 0 %
Lymphocytes Relative: 19 %
Lymphs Abs: 0.9 10*3/uL (ref 0.7–4.0)
MCH: 24.7 pg — ABNORMAL LOW (ref 26.0–34.0)
MCHC: 29.8 g/dL — ABNORMAL LOW (ref 30.0–36.0)
MCV: 82.8 fL (ref 80.0–100.0)
Monocytes Absolute: 0.4 10*3/uL (ref 0.1–1.0)
Monocytes Relative: 9 %
Neutro Abs: 3.2 10*3/uL (ref 1.7–7.7)
Neutrophils Relative %: 66 %
Platelet Count: 189 10*3/uL (ref 150–400)
RBC: 3.89 MIL/uL (ref 3.87–5.11)
RDW: 20.9 % — ABNORMAL HIGH (ref 11.5–15.5)
WBC Count: 4.9 10*3/uL (ref 4.0–10.5)
nRBC: 0 % (ref 0.0–0.2)

## 2022-01-16 LAB — SAMPLE TO BLOOD BANK

## 2022-01-16 LAB — FERRITIN: Ferritin: 352 ng/mL — ABNORMAL HIGH (ref 11–307)

## 2022-01-16 MED ORDER — SODIUM CHLORIDE 0.9 % IV SOLN: INTRAVENOUS | Status: DC

## 2022-01-16 MED ORDER — HEPARIN SOD (PORK) LOCK FLUSH 100 UNIT/ML IV SOLN: 500.0000 [IU] | Freq: Once | INTRAVENOUS | Status: DC | PRN

## 2022-01-16 MED ORDER — SODIUM CHLORIDE 0.9% FLUSH
10.0000 mL | INTRAVENOUS | Status: DC | PRN
  Administered 2022-01-16: 10 mL

## 2022-01-16 MED ORDER — SODIUM CHLORIDE 0.9 % IV SOLN
510.0000 mg | Freq: Once | INTRAVENOUS | Status: AC
  Administered 2022-01-16: 510 mg via INTRAVENOUS
  Filled 2022-01-16: qty 510

## 2022-01-16 NOTE — Patient Instructions (Signed)

## 2022-01-22 ENCOUNTER — Encounter: Payer: Medicare Other | Admitting: Endocrinology

## 2022-01-22 LAB — POCT GLYCOSYLATED HEMOGLOBIN (HGB A1C): Hemoglobin A1C: 6.1 % — AB (ref 4.0–5.6)

## 2022-01-22 NOTE — Progress Notes (Signed)
This encounter was created in error - please disregard.

## 2022-01-27 ENCOUNTER — Ambulatory Visit (INDEPENDENT_AMBULATORY_CARE_PROVIDER_SITE_OTHER): Payer: Medicare Other | Admitting: Endocrinology

## 2022-01-27 ENCOUNTER — Encounter: Payer: Self-pay | Admitting: Endocrinology

## 2022-01-27 VITALS — BP 144/68 | HR 79

## 2022-01-27 DIAGNOSIS — Z794 Long term (current) use of insulin: Secondary | ICD-10-CM | POA: Diagnosis not present

## 2022-01-27 DIAGNOSIS — E1165 Type 2 diabetes mellitus with hyperglycemia: Secondary | ICD-10-CM | POA: Diagnosis not present

## 2022-01-27 NOTE — Progress Notes (Signed)
Patient ID: [name], female   DOB: Jun 12, 1940, 81 y.o.   MRN: [id number]           Reason for Appointment: Follow-up for Type 2 Diabetes  Referring PCP: Koleen Distance   History of Present Illness:          Date of diagnosis of type 2 diabetes mellitus: 1988       Background history:   Patient has been on insulin for a few years and she is unclear when this was started, according to Epic she was started on Levemir in 2015 Prior to that likely had been on various oral hypoglycemic agents including metformin, glipizide and Tradjenta Her level of control has been somewhat variable but not consistently poor; her A1c has ranged between 5.6 and 7.6  Recent history:   Her A1c is better than usual at 6.1 compared to 7.3  INSULIN regimen is: Lantus 10 units at bedtime, lispro 5 units before each meal  Non-insulin hypoglycemic drugs prescribed: Trulicity 3 mg weekly  Current management, blood sugar patterns and problems identified:  She is to start taking Lantus 7 units twice daily but nursing home is still giving her only 10 units at night As before her blood sugars are highly variable Blood sugars appear to be more consistently high after dinner later at night around 8-9 PM Blood sugars are generally not high at lunchtime and today in the office it was about 90 late morning However blood sugars are quite variable fasting and as low as 62, not clear if she has had any hypoglycemia  Patient unable to give any history Not clear if she is having consistent food intake also She may be getting some nutritional supplements in the mornings  Also continuing Trulicity Difficult to assess her weight       Side effects from medications have been: None known     Meal times are:  Breakfast is at variable times between 8 AM-9  Dinner 7 pm             Exercise:  Unable to do any  Glucose monitoring: As below      glucometer: Unknown brand  Blood Glucose readings from nursing home record for  the last 6 days   PRE-MEAL Fasting Lunch Dinner Bedtime Overall  Glucose range: 62-185 120-311 141-432 229-431   Mean/median:        Previously: PRE-MEAL Fasting Lunch Dinner Bedtime Overall  Glucose range: 116-358 161-400 96-506 196-391   Mean/median:         POST-MEAL PC Breakfast PC Lunch PC Dinner  Glucose range:  ?:   Mean/median:        Dietician visit, most recent: 11/2018  Weight history:  Wt Readings from Last 3 Encounters:  12/19/21 128 lb 8.5 oz (58.3 kg)  11/22/21 128 lb 8.5 oz (58.3 kg)  11/01/21 121 lb 4.1 oz (55 kg)    Glycemic control:   Lab Results  Component Value Date   HGBA1C 6.1 (A) 01/22/2022   HGBA1C 7.3 (A) 09/25/2021   HGBA1C 7.7 (A) 07/03/2021   Lab Results  Component Value Date   MICROALBUR 1,289.4 (H) 03/07/2019   LDLCALC 57 01/30/2019   CREATININE 1.27 (H) 12/19/2021   Lab Results  Component Value Date   MICRALBCREAT 2,116 (H) 09/25/2021    Lab Results  Component Value Date   FRUCTOSAMINE 404 (H) 10/31/2019   FRUCTOSAMINE 256 06/26/2019    Lab Results  Component Value Date   HGB 9.6 (L)  01/16/2022     Allergies as of 01/27/2022       Reactions   Aspirin Nausea And Vomiting        Medication List        Accurate as of January 27, 2022  6:10 PM. If you have any questions, ask your nurse or doctor.          amLODipine 5 MG tablet Commonly known as: NORVASC Take 1 tablet (5 mg total) by mouth daily.   bisacodyl 10 MG suppository Commonly known as: DULCOLAX Place 10 mg rectally as needed for moderate constipation.   diclofenac Sodium 1 % Gel Commonly known as: VOLTAREN Apply 2 g topically 4 (four) times daily.   gabapentin 100 MG capsule Commonly known as: NEURONTIN Take 1 capsule (100 mg total) by mouth in the morning AND 2 capsules (200 mg total) at bedtime.   guaiFENesin 100 MG/5ML liquid Commonly known as: ROBITUSSIN Take 200 mg by mouth every 4 (four) hours as needed for cough.   insulin  lispro 100 UNIT/ML KwikPen Commonly known as: HumaLOG KwikPen Inject 3 Units into the skin 3 (three) times daily. What changed: how much to take   lactulose 10 GM/15ML solution Commonly known as: CHRONULAC Take 15 mLs by mouth in the morning, at noon, and at bedtime.   Lantus SoloStar 100 UNIT/ML Solostar Pen Generic drug: insulin glargine Inject 10 Units into the skin at bedtime.   levETIRAcetam 500 MG tablet Commonly known as: KEPPRA Take 1 tablet (500 mg total) by mouth 2 (two) times daily.   memantine 10 MG tablet Commonly known as: NAMENDA TAKE 1 TABLET BY MOUTH TWICE DAILY   MILK OF MAGNESIA PO Take 30 mLs by mouth every evening.   Multi For Her 50+ Tabs Take 1 tablet by mouth daily with breakfast.   pantoprazole 40 MG tablet Commonly known as: PROTONIX Take 1 tablet (40 mg total) by mouth 2 (two) times daily.   torsemide 20 MG tablet Commonly known as: DEMADEX Take 1 tablet (20 mg total) by mouth as needed (please take 1 '20mg'$  tablet as needed for weight gain of 2 pounds overnight or 5 pounds in a week).   triamcinolone cream 0.1 % Commonly known as: KENALOG Apply 1 Application topically 2 (two) times daily.   Trulicity 3 ZO/1.0RU Sopn Generic drug: Dulaglutide Inject 3 mg into the skin once a week.        Allergies:  Allergies  Allergen Reactions   Aspirin Nausea And Vomiting    Past Medical History:  Diagnosis Date   Chronic anemia    Chronic diastolic CHF (congestive heart failure) ([place]) 10/03/2013   Chronic GI bleeding    /notes 11/29/2014   Family history of anesthesia complication    "niece has a hard time coming out" (09/15/2012)   Frequent nosebleeds    chronic   Gastric AV malformation    /notes 11/29/2014   GERD (gastroesophageal reflux disease)    Heart murmur 04/01/2017   Moderate AVSC on echo 09/2016   Hematemesis 12/04/2017   History of blood transfusion "several"   History of epistaxis    HTN (hypertension), benign 03/02/2012    Hyperlipidemia    Iron deficiency anemia    chronic infusions"   Lichen planus    Both lower extremities   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    Past Surgical History:  Procedure Laterality Date   CATARACT EXTRACTION     "I think it was just one eye"   ESOPHAGOGASTRODUODENOSCOPY  02/26/2011   Procedure: ESOPHAGOGASTRODUODENOSCOPY (EGD);  Surgeon: JMissy Sabins MD;  [facility];  Service: Endoscopy;  Laterality: N/A;   ESOPHAGOGASTRODUODENOSCOPY N/A 11/08/2012   Procedure: ESOPHAGOGASTRODUODENOSCOPY (EGD);  Surgeon: PBeryle Beams MD;  [facility];  Service: Endoscopy;  Laterality: N/A;   ESOPHAGOGASTRODUODENOSCOPY N/A 10/04/2013   Procedure: ESOPHAGOGASTRODUODENOSCOPY (EGD);  Surgeon: JWinfield Cunas, MD;  [facility];  Service: Endoscopy;  Laterality: N/A;  with APC on stand-by   ESOPHAGOGASTRODUODENOSCOPY N/A 07/06/2014   Procedure: ESOPHAGOGASTRODUODENOSCOPY (EGD);  Surgeon: MClarene Essex MD;  [facility];  Service: Endoscopy;  Laterality: N/A;   ESOPHAGOGASTRODUODENOSCOPY N/A 09/05/2014   Procedure: ESOPHAGOGASTRODUODENOSCOPY (EGD);  Surgeon: JLaurence Spates MD;  [facility];  Service: Endoscopy;  Laterality: N/A;  APC on standby to control bleeding   ESOPHAGOGASTRODUODENOSCOPY N/A 11/29/2014   Procedure: ESOPHAGOGASTRODUODENOSCOPY (EGD);  Surgeon: VWilford Corner MD;  [facility]  Service: Endoscopy;  Laterality: N/A;   ESOPHAGOGASTRODUODENOSCOPY N/A 09/28/2015   Procedure: ESOPHAGOGASTRODUODENOSCOPY (EGD);  Surgeon: MClarene Essex MD;  [facility]  Service: Endoscopy;  Laterality: N/A;    ESOPHAGOGASTRODUODENOSCOPY N/A 11/22/2021   Procedure: ESOPHAGOGASTRODUODENOSCOPY (EGD);  Surgeon: VLoney Laurence DO;  [facility];  Service: Gastroenterology;  Laterality: N/A;   ESOPHAGOGASTRODUODENOSCOPY (EGD) WITH PROPOFOL N/A 12/04/2017   Procedure: ESOPHAGOGASTRODUODENOSCOPY (EGD) WITH PROPOFOL;  Surgeon: SWilford Corner MD;  [facility]  Service: Endoscopy;  Laterality: N/A;   ESOPHAGOGASTRODUODENOSCOPY (EGD) WITH PROPOFOL N/A 12/24/2020   Procedure: ESOPHAGOGASTRODUODENOSCOPY (EGD) WITH PROPOFOL;  Surgeon: OArta Silence MD;  [place] [facility]  Service: Endoscopy;  Laterality: N/A;   ESOPHAGOGASTRODUODENOSCOPY (EGD) WITH PROPOFOL N/A 01/19/2021   Procedure: ESOPHAGOGASTRODUODENOSCOPY (EGD) WITH PROPOFOL;  Surgeon: SWilford Corner MD;  [place] [facility]  Service: Endoscopy;  Laterality: N/A;   ESOPHAGOGASTRODUODENOSCOPY (EGD) WITH PROPOFOL N/A 02/06/2021   Procedure: ESOPHAGOGASTRODUODENOSCOPY (EGD) WITH PROPOFOL;  Surgeon: KRonnette Juniper MD;  [facility]  Service: Gastroenterology;  Laterality: N/A;   ESOPHAGOGASTRODUODENOSCOPY ENDOSCOPY  08/19/2006   with laser treatment   HEMOSTASIS CLIP PLACEMENT  12/24/2020   Procedure: HEMOSTASIS CLIP PLACEMENT;  Surgeon: OArta Silence MD;  [facility];  Service: Endoscopy;;   HEMOSTASIS CLIP PLACEMENT  01/19/2021   Procedure: HEMOSTASIS CLIP PLACEMENT;  Surgeon: SWilford Corner MD;  [facility]  Service: Endoscopy;;   HEMOSTASIS CLIP PLACEMENT  11/22/2021   Procedure: HEMOSTASIS CLIP PLACEMENT;  Surgeon: VLoney Laurence DO;  [name];  Service: Gastroenterology;;   HOT HEMOSTASIS N/A 07/06/2014   Procedure: HOT HEMOSTASIS (ARGON PLASMA COAGULATION/BICAP);  Surgeon: MClarene Essex MD;  [facility];  Service: Endoscopy;  Laterality: N/A;   HOT HEMOSTASIS N/A 09/28/2015   Procedure: HOT HEMOSTASIS (ARGON PLASMA COAGULATION/BICAP);  Surgeon:  MClarene Essex MD;  [facility];  Service: Endoscopy;  Laterality: N/A;   HOT HEMOSTASIS N/A 12/04/2017   Procedure: HOT HEMOSTASIS (ARGON PLASMA COAGULATION/BICAP);  Surgeon: SWilford Corner MD;  [facility]  Service: Endoscopy;  Laterality: N/A;   HOT HEMOSTASIS  11/22/2021   Procedure: HOT HEMOSTASIS (ARGON PLASMA COAGULATION/BICAP);  Surgeon: VLoney Laurence DO;  [name];  Service: Gastroenterology;;  IR ANGIOGRAM SELECTIVE EACH ADDITIONAL VESSEL  02/05/2021   IR ANGIOGRAM VISCERAL SELECTIVE  02/05/2021   IR EMBO ART  VEN HEMORR LYMPH EXTRAV  INC GUIDE ROADMAPPING  02/05/2021   IR US GUIDE VASC ACCESS RIGHT  02/05/2021   NASAL HEMORRHAGE CONTROL     "for bleeding"    SAVORY DILATION  02/26/2011   Procedure: SAVORY DILATION;  Surgeon: [name], MD;  [name];  Service: Endoscopy;  Laterality: N/A;  c-arm needed   SCLEROTHERAPY  12/24/2020   Procedure: SCLEROTHERAPY;  Surgeon: Arta Silence, MD;  Location[facility]COPY;  Service: Endoscopy;;   SCLEROTHERAPY  01/19/2021   Procedure: Clide Deutscher;  Surgeon: [name], MD;  [facility];  Service: Endoscopy;;   SUBMUCOSAL INJECTION  12/04/2017   Procedure: SUBMUCOSAL INJECTION;  Surgeon: [name], MD;  [facility];  Service: Endoscopy;;    Family History  Problem Relation Age of Onset   Healthy Mother    Stroke Father    Diabetes Niece    Breast cancer Other    Malignant hyperthermia Neg Hx    Seizures Neg Hx    Lung cancer Neg Hx     Social History:  reports that she quit smoking about 50 years ago. Her smoking use included cigarettes. She has a 20.00 pack-year smoking history. She has never used smokeless tobacco. She reports that she does not drink alcohol and does not use drugs.   Review of Systems   Lipid history: Not on a statin drug    Lab Results  Component Value Date   CHOL 133 01/30/2019   HDL 41.30 01/30/2019    LDLCALC 57 01/30/2019   LDLDIRECT 148 (H) 03/05/2021   TRIG 176.0 (H) 01/30/2019   CHOLHDL 3 01/30/2019           Hypertension: Treated with amlodipine currently  BP Readings from Last 3 Encounters:  01/27/22 (!) 144/68  01/16/22 (!) 161/60  01/09/22 (!) 148/53   Renal function history: Has somewhat variable creatinine levels   Lab Results  Component Value Date   CREATININE 1.27 (H) 12/19/2021   CREATININE 0.93 12/08/2021   CREATININE 1.18 (H) 11/23/2021   Lab Results  Component Value Date   K 4.3 07/62/2633     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, microalbuminuria     Physical Examination:  BP (!) 144/68   Pulse 79   LMP  (LMP Unknown)   SpO2 98%    ASSESSMENT:  Diabetes type 2 on insulin  See history of present illness for detailed discussion of current diabetes management, blood sugar patterns and problems identified  A1c is 6.1 compared to 7.3 However she has significant anemia  She is currently on Trulicity 3 mg, Humalog 5 units before meals, Lantus 10 units at night night from nursing home records  Blood sugars are again variable but not consistently high, mostly getting significant high readings in the early evening or after dinner.  However no hypoglycemia  Unable to assess her diet also  PLAN:    Humalog at mealtimes will be now 4-5-6 at breakfast/lunch/dinner  Instructions were written for the nursing home on the consultation sheet  New insulin orders as of today  Reduce LANTUS insulin to 8 units in the morning only   Patient Instructions  See yellow sheet   Elayne Snare 01/27/2022, 6:10 PM   Note: This office note was prepared with Dragon voice recognition system technology. Any transcriptional errors that result from this process are unintentional.

## 2022-01-27 NOTE — Patient Instructions (Signed)
See yellow sheet

## 2022-01-28 ENCOUNTER — Encounter (HOSPITAL_COMMUNITY): Payer: Self-pay

## 2022-01-28 ENCOUNTER — Emergency Department (HOSPITAL_COMMUNITY)
Admission: EM | Admit: 2022-01-28 | Discharge: 2022-01-28 | Disposition: A | Discharge status: Skilled Nursing Facility | Payer: Medicare Other | Attending: Emergency Medicine | Admitting: Emergency Medicine

## 2022-01-28 ENCOUNTER — Emergency Department (HOSPITAL_COMMUNITY): Payer: Medicare Other

## 2022-01-28 ENCOUNTER — Other Ambulatory Visit: Payer: Self-pay

## 2022-01-28 DIAGNOSIS — Z79899 Other long term (current) drug therapy: Secondary | ICD-10-CM | POA: Diagnosis not present

## 2022-01-28 DIAGNOSIS — I110 Hypertensive heart disease with heart failure: Secondary | ICD-10-CM | POA: Insufficient documentation

## 2022-01-28 DIAGNOSIS — I509 Heart failure, unspecified: Secondary | ICD-10-CM | POA: Insufficient documentation

## 2022-01-28 DIAGNOSIS — M79662 Pain in left lower leg: Secondary | ICD-10-CM | POA: Insufficient documentation

## 2022-01-28 DIAGNOSIS — Z794 Long term (current) use of insulin: Secondary | ICD-10-CM | POA: Diagnosis not present

## 2022-01-28 DIAGNOSIS — M79605 Pain in left leg: Secondary | ICD-10-CM

## 2022-01-28 DIAGNOSIS — K922 Gastrointestinal hemorrhage, unspecified: Secondary | ICD-10-CM | POA: Diagnosis not present

## 2022-01-28 LAB — COMPREHENSIVE METABOLIC PANEL
ALT: 18 U/L (ref 0–44)
AST: 51 U/L — ABNORMAL HIGH (ref 15–41)
Albumin: 2.3 g/dL — ABNORMAL LOW (ref 3.5–5.0)
Alkaline Phosphatase: 111 U/L (ref 38–126)
Anion gap: 10 (ref 5–15)
BUN: 24 mg/dL — ABNORMAL HIGH (ref 8–23)
CO2: 22 mmol/L (ref 22–32)
Calcium: 8.9 mg/dL (ref 8.9–10.3)
Chloride: 115 mmol/L — ABNORMAL HIGH (ref 98–111)
Creatinine, Ser: 1.28 mg/dL — ABNORMAL HIGH (ref 0.44–1.00)
GFR, Estimated: 42 mL/min — ABNORMAL LOW (ref >60)
Glucose, Bld: 122 mg/dL — ABNORMAL HIGH (ref 70–99)
Potassium: 4 mmol/L (ref 3.5–5.1)
Sodium: 147 mmol/L — ABNORMAL HIGH (ref 135–145)
Total Bilirubin: 0.8 mg/dL (ref 0.3–1.2)
Total Protein: 5.2 g/dL — ABNORMAL LOW (ref 6.5–8.1)

## 2022-01-28 LAB — CBC WITH DIFFERENTIAL/PLATELET
Abs Immature Granulocytes: 0 10*3/uL (ref 0.00–0.07)
Basophils Absolute: 0 10*3/uL (ref 0.0–0.1)
Basophils Relative: 0 %
Eosinophils Absolute: 0.1 10*3/uL (ref 0.0–0.5)
Eosinophils Relative: 3 %
HCT: 34 % — ABNORMAL LOW (ref 36.0–46.0)
Hemoglobin: 9.7 g/dL — ABNORMAL LOW (ref 12.0–15.0)
Lymphocytes Relative: 4 %
Lymphs Abs: 0.1 10*3/uL — ABNORMAL LOW (ref 0.7–4.0)
MCH: 25.9 pg — ABNORMAL LOW (ref 26.0–34.0)
MCHC: 28.5 g/dL — ABNORMAL LOW (ref 30.0–36.0)
MCV: 90.9 fL (ref 80.0–100.0)
Monocytes Absolute: 0.3 10*3/uL (ref 0.1–1.0)
Monocytes Relative: 9 %
Neutro Abs: 2.8 10*3/uL (ref 1.7–7.7)
Neutrophils Relative %: 84 %
Platelets: 142 10*3/uL — ABNORMAL LOW (ref 150–400)
RBC: 3.74 MIL/uL — ABNORMAL LOW (ref 3.87–5.11)
RDW: 23.7 % — ABNORMAL HIGH (ref 11.5–15.5)
WBC: 3.3 10*3/uL — ABNORMAL LOW (ref 4.0–10.5)
nRBC: 0 % (ref 0.0–0.2)
nRBC: 0 /100 WBC

## 2022-01-28 LAB — POC OCCULT BLOOD, ED: Fecal Occult Bld: POSITIVE — AB

## 2022-01-28 LAB — LIPASE, BLOOD: Lipase: 31 U/L (ref 11–51)

## 2022-01-28 LAB — CK: Total CK: 85 U/L (ref 38–234)

## 2022-01-28 MED ORDER — MORPHINE SULFATE (PF) 4 MG/ML IV SOLN
4.0000 mg | Freq: Once | INTRAVENOUS | Status: AC
  Administered 2022-01-28: 4 mg via INTRAVENOUS
  Filled 2022-01-28: qty 1

## 2022-01-28 MED ORDER — ONDANSETRON HCL 4 MG/2ML IJ SOLN
4.0000 mg | Freq: Once | INTRAMUSCULAR | Status: AC
  Administered 2022-01-28: 4 mg via INTRAVENOUS
  Filled 2022-01-28: qty 2

## 2022-01-28 NOTE — ED Notes (Signed)
[facility] CALLED UNABLE TO GIVE PICK UP TIME

## 2022-01-28 NOTE — Discharge Instructions (Signed)
Continue your current medications.  There was some blood noted in your stool but your blood count is stable.  I discussed your case with the [facility] gastroenterology, Dr Paulita Fujita and you do not need to be admitted to the hospital again at this time.  Return to the ED for worsening symptoms, bright red blood in your stool or other concerns

## 2022-01-28 NOTE — ED Provider Notes (Signed)
[facility] EMERGENCY DEPARTMENT Provider Note   CSN: [id number] Arrival date & time: 01/28/22  7619     History  Chief Complaint  Patient presents with   Leg Pain    [name] is a 81 y.o. female.   Leg Pain    Patient is a resident of a skilled nursing facility.  She has a history of hyperlipidemia, GERD, chronic CHF, hypertension, seizures.  According to the EMS report, patient had been complaining of abdominal pain that started on Monday.  She was evaluated by the PA at the facility.  This morning they sent her to the ED for evaluation.  Patient tells me she is having pain in her legs.  She denies having any abdominal pain.  She is not having any vomiting or diarrhea.  Patient states his pain is in both legs all over.  She denies any recent falls or injury.  She denies any fevers or chills.  Home Medications Prior to Admission medications   Medication Sig Start Date End Date Taking? Authorizing Provider  amLODipine (NORVASC) 5 MG tablet Take 1 tablet (5 mg total) by mouth daily. 09/25/21 09/20/22  Lacinda Axon, MD  bisacodyl (DULCOLAX) 10 MG suppository Place 10 mg rectally as needed for moderate constipation.    [provider]  diclofenac Sodium (VOLTAREN) 1 % GEL Apply 2 g topically 4 (four) times daily. 12/26/20   Lacinda Axon, MD  gabapentin (NEURONTIN) 100 MG capsule Take 1 capsule (100 mg total) by mouth in the morning AND 2 capsules (200 mg total) at bedtime. 12/08/21   Sanjuan Dame, MD  guaiFENesin (ROBITUSSIN) 100 MG/5ML liquid Take 200 mg by mouth every 4 (four) hours as needed for cough.    [provider]  insulin lispro (HUMALOG KWIKPEN) 100 UNIT/ML KwikPen Inject 3 Units into the skin 3 (three) times daily. Patient taking differently: Inject 5 Units into the skin 3 (three) times daily. 09/25/21   Lacinda Axon, MD  lactulose (CHRONULAC) 10 GM/15ML solution Take 15 mLs by mouth in the morning, at noon,  and at bedtime. 09/27/21   [provider]  LANTUS SOLOSTAR 100 UNIT/ML Solostar Pen Inject 10 Units into the skin at bedtime. 09/25/21   Lacinda Axon, MD  levETIRAcetam (KEPPRA) 500 MG tablet Take 1 tablet (500 mg total) by mouth 2 (two) times daily. 02/12/21   Orvis Brill, MD  Magnesium Hydroxide (MILK OF MAGNESIA PO) Take 30 mLs by mouth every evening.    [provider]  memantine (NAMENDA) 10 MG tablet TAKE 1 TABLET BY MOUTH TWICE DAILY 11/11/20   [name], Areeg N, DO  Multiple Vitamins-Minerals (MULTI FOR HER 50+) TABS Take 1 tablet by mouth daily with breakfast.    [provider]  pantoprazole (PROTONIX) 40 MG tablet Take 1 tablet (40 mg total) by mouth 2 (two) times daily. 12/26/20   Lacinda Axon, MD  torsemide (DEMADEX) 20 MG tablet Take 1 tablet (20 mg total) by mouth as needed (please take 1 '20mg'$  tablet as needed for weight gain of 2 pounds overnight or 5 pounds in a week). 03/05/21 01/27/22  Lenna Sciara, NP  triamcinolone cream (KENALOG) 0.1 % Apply 1 Application topically 2 (two) times daily.    [provider]  TRULICITY 3 JK/9.3OI SOPN Inject 3 mg into the skin once a week. Patient not taking: Reported on 01/27/2022 09/25/21   Lacinda Axon, MD      Allergies  Aspirin    Review of Systems   Review of Systems  Physical Exam Updated Vital Signs BP (!) 110/46   Pulse (!) 50   Temp (!) 97.5 F (36.4 C) (Oral)   Resp 11   LMP  (LMP Unknown)   SpO2 95%  Physical Exam Vitals and nursing note reviewed.  Constitutional:      Appearance: She is well-developed.     Comments: Appears to be in pain, moaning  HENT:     Head: Normocephalic and atraumatic.     Right Ear: External ear normal.     Left Ear: External ear normal.  Eyes:     General: No scleral icterus.       Right eye: No discharge.        Left eye: No discharge.     Conjunctiva/sclera: Conjunctivae normal.  Neck:     Trachea: No tracheal deviation.   Cardiovascular:     Rate and Rhythm: Normal rate and regular rhythm.  Pulmonary:     Effort: Pulmonary effort is normal. No respiratory distress.     Breath sounds: Normal breath sounds. No stridor. No wheezing or rales.  Abdominal:     General: Bowel sounds are normal. There is no distension.     Palpations: Abdomen is soft.     Tenderness: There is no abdominal tenderness. There is no guarding or rebound.  Genitourinary:    Comments: In continent in her diaper, dark-colored stool noted Musculoskeletal:        General: Tenderness present. No deformity.     Cervical back: Neck supple.     Right lower leg: No edema.     Left lower leg: No edema.     Comments: Pain with range of motion primarily of her left lower extremity, tenderness to palpation of the left hip area, extremities are warm and well-perfused bilaterally, no cyanosis no edema  Skin:    General: Skin is warm and dry.     Findings: No rash.     Comments: No rashes no erythema, no decubitus ulcers noted in back or lower extremities  Neurological:     General: No focal deficit present.     Mental Status: She is alert.     Cranial Nerves: No cranial nerve deficit, dysarthria or facial asymmetry.     Sensory: No sensory deficit.     Motor: No abnormal muscle tone or seizure activity.     Coordination: Coordination normal.  Psychiatric:        Mood and Affect: Mood normal.     ED Results / Procedures / Treatments   Labs (all labs ordered are listed, but only abnormal results are displayed) Labs Reviewed  COMPREHENSIVE METABOLIC PANEL - Abnormal; Notable for the following components:      Result Value   Sodium 147 (*)    Chloride 115 (*)    Glucose, Bld 122 (*)    BUN 24 (*)    Creatinine, Ser 1.28 (*)    Total Protein 5.2 (*)    Albumin 2.3 (*)    AST 51 (*)    GFR, Estimated 42 (*)    All other components within normal limits  CBC WITH DIFFERENTIAL/PLATELET - Abnormal; Notable for the following components:    WBC 3.3 (*)    RBC 3.74 (*)    Hemoglobin 9.7 (*)    HCT 34.0 (*)    MCH 25.9 (*)    MCHC 28.5 (*)    RDW 23.7 (*)  Platelets 142 (*)    Lymphs Abs 0.1 (*)    All other components within normal limits  POC OCCULT BLOOD, ED - Abnormal; Notable for the following components:   Fecal Occult Bld POSITIVE (*)    All other components within normal limits  LIPASE, BLOOD  CK  CBC WITH DIFFERENTIAL/PLATELET  URINALYSIS, ROUTINE W REFLEX MICROSCOPIC    EKG None  Radiology DG Femur Min 2 Views Left  Result Date: 01/28/2022 CLINICAL DATA:  Bilateral hip pain. EXAM: DG HIP (WITH OR WITHOUT PELVIS) 2-3V RIGHT; DG HIP (WITH OR WITHOUT PELVIS) 2-3V LEFT; LEFT FEMUR 2 VIEWS COMPARISON:  None Available. FINDINGS: Pelvis/bilateral hips: There is no evidence of hip fracture or dislocation. Pubic symphysis and bilateral sacroiliac joints are within normal limits. Mild bilateral hip joint space narrowing with marginal spurring suggesting mild osteoarthritis. Left femur: No acute fracture or dislocation. Tricompartmental knee osteoarthritis. IMPRESSION: 1. No acute fracture or dislocation. 2. Mild bilateral hip osteoarthritis. 3. Tricompartmental knee osteoarthritis. Electronically Signed   By: Keane Police D.O.   On: 01/28/2022 10:28   DG HIP UNILAT WITH PELVIS 2-3 VIEWS RIGHT  Result Date: 01/28/2022 CLINICAL DATA:  Bilateral hip pain. EXAM: DG HIP (WITH OR WITHOUT PELVIS) 2-3V RIGHT; DG HIP (WITH OR WITHOUT PELVIS) 2-3V LEFT; LEFT FEMUR 2 VIEWS COMPARISON:  None Available. FINDINGS: Pelvis/bilateral hips: There is no evidence of hip fracture or dislocation. Pubic symphysis and bilateral sacroiliac joints are within normal limits. Mild bilateral hip joint space narrowing with marginal spurring suggesting mild osteoarthritis. Left femur: No acute fracture or dislocation. Tricompartmental knee osteoarthritis. IMPRESSION: 1. No acute fracture or dislocation. 2. Mild bilateral hip osteoarthritis. 3.  Tricompartmental knee osteoarthritis. Electronically Signed   By: Keane Police D.O.   On: 01/28/2022 10:28   DG HIP UNILAT WITH PELVIS 2-3 VIEWS LEFT  Result Date: 01/28/2022 CLINICAL DATA:  Bilateral hip pain. EXAM: DG HIP (WITH OR WITHOUT PELVIS) 2-3V RIGHT; DG HIP (WITH OR WITHOUT PELVIS) 2-3V LEFT; LEFT FEMUR 2 VIEWS COMPARISON:  None Available. FINDINGS: Pelvis/bilateral hips: There is no evidence of hip fracture or dislocation. Pubic symphysis and bilateral sacroiliac joints are within normal limits. Mild bilateral hip joint space narrowing with marginal spurring suggesting mild osteoarthritis. Left femur: No acute fracture or dislocation. Tricompartmental knee osteoarthritis. IMPRESSION: 1. No acute fracture or dislocation. 2. Mild bilateral hip osteoarthritis. 3. Tricompartmental knee osteoarthritis. Electronically Signed   By: Keane Police D.O.   On: 01/28/2022 10:28   DG Tibia/Fibula Left  Result Date: 01/28/2022 CLINICAL DATA:  Leg pain. EXAM: LEFT TIBIA AND FIBULA - 2 VIEW COMPARISON:  None Available. FINDINGS: There is no evidence of fracture or other focal bone lesions. Tricompartmental knee joint space narrowing prominent in the patellofemoral compartment. Soft tissues are unremarkable. IMPRESSION: 1. No acute fracture or dislocation. 2. Tricompartmental knee osteoarthritis. Electronically Signed   By: Keane Police D.O.   On: 01/28/2022 10:25    Procedures Procedures    Medications Ordered in ED Medications  ondansetron (ZOFRAN) injection 4 mg (4 mg Intravenous Given 01/28/22 0936)  morphine (PF) 4 MG/ML injection 4 mg (4 mg Intravenous Given 01/28/22 0936)    ED Course/ Medical Decision Making/ A&P Clinical Course as of 01/28/22 1247  Wed Jan 28, 2022  1112 Comprehensive metabolic panel(!) BUN and sodium increased compared to previous [JK]  1112 CK Normal [JK]  1113 Arthritic changes noted on x-rays but no acute fractures [JK]  1115 Prior GI, Dr. Randel Pigg [JK]  1128 CBC  with Differential/Platelet(!) Hemoglobin stable compared to previous values [JK]  1219 Reviewed case with Dr Paulita Fujita.  Pt has history of recurrent GI bleeding due to [JK]    Clinical Course User Index [JK] Dorie Rank, MD                           Medical Decision Making Problems Addressed: Chronic GI bleeding: chronic illness or injury with exacerbation, progression, or side effects of treatment Pain of left lower extremity: acute illness or injury  Amount and/or Complexity of Data Reviewed Labs: ordered. Decision-making details documented in ED Course. Radiology: ordered and independent interpretation performed.  Risk Prescription drug management. Risk Details: Patient presented to the ED for evaluation of possible abdominal pain.  On my exam the patient denied any complaints of abdominal pain.  She primarily was complaining of pain in her lower extremities.  Patient's labs are notable for Hemoccult positive stools but she does have chronic history of GI bleeding.  Her hemoglobin is stable.  I discussed the case with Dr. Paulita Fujita gastroenterology and he does not feel the patient requires any recurrent admission for Hemoccult positive stools.  She does not appear to have any active bleeding at this time.  She also was complaining of extremity pain.  There is no signs of any acute vascular occlusion.  No signs of infection.  X-rays do show some arthritic changes.  Patient appears stable for discharge back to the nursing facility   Evaluation and diagnostic testing in the emergency department does not suggest an emergent condition requiring admission or immediate intervention beyond what has been performed at this time.  The patient is safe for discharge and has been instructed to return immediately for worsening symptoms, change in symptoms or any other concerns.        Final Clinical Impression(s) / ED Diagnoses Final diagnoses:  Pain of left lower extremity  Chronic GI bleeding     Rx / DC Orders ED Discharge Orders     None         Dorie Rank, MD 01/28/22 1248

## 2022-01-28 NOTE — ED Triage Notes (Signed)
Pt BIB [facility] from [facility] facility d/t abd pain that started on Monday. Pt was evaluated by the PA at the facility which they did not find anything concerning. Pt  has dementia, is acting at her baseline. No n/v or sob. Hurts allover. VSS.   150/72 HR 65  RR 16  98% RA

## 2022-02-06 ENCOUNTER — Non-Acute Institutional Stay: Payer: Medicare Other | Admitting: Hospice

## 2022-02-06 DIAGNOSIS — Z515 Encounter for palliative care: Secondary | ICD-10-CM

## 2022-02-06 DIAGNOSIS — I5032 Chronic diastolic (congestive) heart failure: Secondary | ICD-10-CM

## 2022-02-06 DIAGNOSIS — F0390 Unspecified dementia without behavioral disturbance: Secondary | ICD-10-CM

## 2022-02-06 DIAGNOSIS — E119 Type 2 diabetes mellitus without complications: Secondary | ICD-10-CM

## 2022-02-06 NOTE — Progress Notes (Signed)
  [facility] Consult Note Telephone: [phone]  Fax: [phone]  PATIENT NAME: [name] [address] [place] [address] [phone] (home)  DOB: 11/14/1940 MRN: [id number]  PRIMARY CARE PROVIDER:    White, Jonathan, MD,  [address] [place]  [address] [phone]  REFERRING PROVIDER:  Pamela McCarthy, NP   RESPONSIBLE PARTY:   Spouse [phone] Contact: Tabitha [phone], [phone]  Contact Information     Name Relation Home Work Mobile   Mebanes,Denise Niece   [phone]   Longan,[name] Spouse [phone]  [phone]      I met face to face with patient and at facility. Palliative Care was asked to follow this patient by consultation request of  Pam McCarthy NP to address advance care planning, complex medical decision making and goals of care clarification.  NP called Tabitha, left her a voicemail with call back number.     ASSESSMENT AND / RECOMMENDATIONS:   CODE STATUS: Patient is a Full code  Goals of Care: Goals include to maximize quality of life and symptom management  Symptom Management/Plan: Dementia: Advanced, progressive memory loss and confusion in line with Dementia disease trajectory. Encourage reminiscence, word search/puzzles, cueing for recollection.  Promote calm approach and engaging environment.  Safety and fall precautions.  Continue  memantine as currently ordered; incontinent of bowel and bladder FAST 6D.  Type 2 DM: Stable A1c 6.09 Jan 2022. Saw Endocrinologist 01/27/22. Continue Trulicity (Dulaglutide), Lantus as ordered. No concentrated sweets. Diabetic diet, no empty caloric beverages. Check CBG as ordered to help ensure tight control.  Repeat A1c in 3 to 6 months. CFH: Continue Torsemide as ordered. Elevate BLE during the day as much as possible to promote circulation. Adhere to Fluid and salt limits. Monitor weight per facility protocol.  Follow up:  Palliative care will continue to follow for complex medical decision making, advance care planning, and clarification of goals. Return 6 weeks or prn. Encouraged to call provider sooner with any concerns.   Family /Caregiver/Community Supports: Patient is in SNF for ongoing care  HOSPICE ELIGIBILITY/DIAGNOSIS: TBD  Chief Complaint: Follow up visit  HISTORY OF PRESENT ILLNESS:  [name] is a 81 y.o. year old female  with multiple morbidities requiring close monitoring/management with high risk of complications and morbidity: Advanced Dementia,Type 2 DM. CHF, Anemia.  Patient denies pain/discomfort. History obtained from review of EMR, discussion with primary team, caregiver, family and/or Ms. Troost.  Patient denies pain/discomfort, states she is overall doing well.  She states she wants to go back to her house. Review and summarization of Epic records shows history from other than patient. Rest of 10 point ROS asked and negative.  I reviewed as needed, available labs, patient records, imaging, studies and related documents from the EMR.   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   Fatigue 07/10/2011   Type 2 diabetes mellitus ([name] 09/15/2012   Chronic diastolic CHF (congestive heart failure) ([name] 10/03/2013   Essential hypertension 03/02/2012   CKD (chronic kidney disease), stage III ([name]    Simple febrile convulsions ([name] 10/21/2014   Dementia ([name] 01/30/2015   Healthcare maintenance 06/20/2015   Seborrheic keratosis 06/20/2015   PAF (paroxysmal atrial fibrillation) ([name] 09/29/2015   Pain in joint of right shoulder 10/22/2015   Peripheral vascular disease ([name] 12/30/2015   Dry   skin 02/17/2016   Venous insufficiency of both lower extremities 04/27/2016   Epistaxis 07/30/2016   Osteopenia after menopause 01/13/2017   Overgrown toenails 03/18/2017    Pulmonary HTN ([name] 04/01/2017   Heart murmur 04/01/2017   Lower extremity pain, bilateral 07/15/2017   Pain localized to upper abdomen 10/05/2017   Leg weakness, bilateral 12/23/2017   Hyperpigmented skin lesion 02/07/2018   Iron deficiency anemia due to chronic blood loss 04/19/2018   Port-A-Cath in place 05/23/2018   Neuropathy 07/27/2019   Seizures ([name] 09/2014   Hearing loss 05/29/2020   Bullous lesion 10/08/2020   History of GI bleed 01/18/2021   Resolved Ambulatory Problems    Diagnosis Date Noted   Type 2 diabetes mellitus with complication, with long-term current use of insulin ([name] 04/08/2006   HYPERLIPIDEMIA 04/08/2006   Anemia, iron deficiency 04/21/2006   Iron deficiency anemia due to chronic blood loss 04/08/2006   HYPERTENSION, BENIGN SYSTEMIC 04/08/2006   SYMPTOM, EPISTAXIS 05/21/2006   Anemia 02/01/2011   GI bleed 02/01/2011   Hypokalemia 02/01/2011   Obesity 09/15/2012   Abnormal CXR 09/15/2012   Dyspnea 10/03/2013   Acute on chronic heart failure with preserved ejection fraction (HFpEF) ([name] 10/03/2013   Upper GI bleed 07/05/2014   Poor venous access    SOB (shortness of breath) 07/27/2014   Acute kidney injury ([name] 08/27/2014   Acute encephalopathy 08/27/2014   Acute renal failure superimposed on stage 3 chronic kidney disease ([name] 08/27/2014   Anemia of chronic disease 08/27/2014   UTI (lower urinary tract infection) 08/27/2014   Anemia 09/04/2014   Acute lower GI bleeding 09/04/2014   Acute blood loss anemia    Hyperammonemia ([name] 10/03/2014   Status epilepticus ([name] 10/03/2014   Hypotension 11/13/2014   Symptomatic anemia 11/29/2014   Bleeding gastrointestinal    SOB (shortness of breath) 01/18/2015   Lichen planus 07/17/2015   Acute GI bleeding 09/28/2015   Acute on chronic blood loss anemia    Angiodysplasia of stomach with hemorrhage    AVM (arteriovenous malformation) of stomach, acquired with hemorrhage    Hyperglycemia due to type  2 diabetes mellitus ([name] 05/21/2016   Muscle spasm of both lower legs 06/15/2016   Hematemesis 12/04/2017   Acute right hip pain 03/10/2018   Hyperkalemia 10/18/2018   Hypervolemia due to congestive heart failure ([name] 08/02/2019   Abnormal LFTs 08/09/2019   Impacted cerumen of left ear 07/15/2020   Hypoglycemia 09/29/2020   Acute upper GI bleed 12/23/2020   Hemorrhagic shock ([name]    Altered mental status 12/31/2020   Acute metabolic encephalopathy 01/09/2021   AKI (acute kidney injury) ([name]    Dehydration    AKI (acute kidney injury) ([name] 01/28/2021   Altered mental status 02/03/2021   AMS (altered mental status) 02/04/2021   Acute upper GI bleed 11/21/2021   Past Medical History:  Diagnosis Date   Chronic anemia    Chronic GI bleeding    Family history of anesthesia complication    Frequent nosebleeds    Gastric AV malformation    GERD (gastroesophageal reflux disease)    History of blood transfusion "several"   History of epistaxis    HTN (hypertension), benign 03/02/2012   Hyperlipidemia    Iron deficiency anemia    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[name]    Pneumonia 1990's X 2   Telangiectasia    Type II diabetes mellitus ([name]     SOCIAL HX:  Social History   Tobacco Use   Smoking status: Former      Packs/day: 1.00    Years: 20.00    Total pack years: 20.00    Types: Cigarettes    Quit date: 02/10/1971    Years since quitting: 51.0   Smokeless tobacco: Never   Tobacco comments:    09/15/2012 "smoked 50-60 yr ago"  Substance Use Topics   Alcohol use: No    Alcohol/week: 0.0 standard drinks of alcohol     FAMILY HX:  Family History  Problem Relation Age of Onset   Healthy Mother    Stroke Father    Diabetes Niece    Breast cancer Other    Malignant hyperthermia Neg Hx    Seizures Neg Hx    Lung cancer Neg Hx       ALLERGIES:  Allergies  Allergen Reactions   Aspirin Nausea And Vomiting      PERTINENT MEDICATIONS:  Outpatient Encounter  Medications as of 02/06/2022  Medication Sig   amLODipine (NORVASC) 5 MG tablet Take 1 tablet (5 mg total) by mouth daily.   bisacodyl (DULCOLAX) 10 MG suppository Place 10 mg rectally as needed for moderate constipation.   diclofenac Sodium (VOLTAREN) 1 % GEL Apply 2 g topically 4 (four) times daily.   gabapentin (NEURONTIN) 100 MG capsule Take 1 capsule (100 mg total) by mouth in the morning AND 2 capsules (200 mg total) at bedtime.   guaiFENesin (ROBITUSSIN) 100 MG/5ML liquid Take 200 mg by mouth every 4 (four) hours as needed for cough.   insulin lispro (HUMALOG KWIKPEN) 100 UNIT/ML KwikPen Inject 3 Units into the skin 3 (three) times daily. (Patient taking differently: Inject 5 Units into the skin 3 (three) times daily.)   lactulose (CHRONULAC) 10 GM/15ML solution Take 15 mLs by mouth in the morning, at noon, and at bedtime.   LANTUS SOLOSTAR 100 UNIT/ML Solostar Pen Inject 10 Units into the skin at bedtime.   levETIRAcetam (KEPPRA) 500 MG tablet Take 1 tablet (500 mg total) by mouth 2 (two) times daily.   Magnesium Hydroxide (MILK OF MAGNESIA PO) Take 30 mLs by mouth every evening.   memantine (NAMENDA) 10 MG tablet TAKE 1 TABLET BY MOUTH TWICE DAILY   Multiple Vitamins-Minerals (MULTI FOR HER 50+) TABS Take 1 tablet by mouth daily with breakfast.   pantoprazole (PROTONIX) 40 MG tablet Take 1 tablet (40 mg total) by mouth 2 (two) times daily.   torsemide (DEMADEX) 20 MG tablet Take 1 tablet (20 mg total) by mouth as needed (please take 1 20mg tablet as needed for weight gain of 2 pounds overnight or 5 pounds in a week).   triamcinolone cream (KENALOG) 0.1 % Apply 1 Application topically 2 (two) times daily.   TRULICITY 3 MG/0.5ML SOPN Inject 3 mg into the skin once a week. (Patient not taking: Reported on 01/27/2022)   Facility-Administered Encounter Medications as of 02/06/2022  Medication   sodium chloride 0.9 % injection 10 mL   sodium chloride flush (NS) 0.9 % injection 10 mL   I  spent 45 minutes providing this consultation; time includes spent with patient/family, chart review and documentation. More than 50% of the time in this consultation was spent on care coordination  Thank you for the opportunity to participate in the care of Ms. Rumpf.  The palliative care team will continue to follow. Please call our office at [phone] if we can be of additional assistance.   Note: Portions of this note were generated with Dragon dictation software. Dictation errors may occur despite best attempts at proofreading.     I , NP     

## 2022-03-02 ENCOUNTER — Encounter: Payer: Self-pay | Admitting: Hematology

## 2022-03-06 ENCOUNTER — Encounter: Payer: Self-pay | Admitting: Hematology

## 2022-03-06 ENCOUNTER — Non-Acute Institutional Stay: Payer: Medicare Other | Admitting: Hospice

## 2022-03-06 DIAGNOSIS — I5032 Chronic diastolic (congestive) heart failure: Secondary | ICD-10-CM

## 2022-03-06 DIAGNOSIS — Z515 Encounter for palliative care: Secondary | ICD-10-CM

## 2022-03-06 DIAGNOSIS — E119 Type 2 diabetes mellitus without complications: Secondary | ICD-10-CM

## 2022-03-06 DIAGNOSIS — F0390 Unspecified dementia without behavioral disturbance: Secondary | ICD-10-CM

## 2022-03-06 NOTE — Progress Notes (Signed)
[facility] Consult Note Telephone: [phone]  Fax: [phone]  PATIENT NAME: [name] [place] [id number] [phone] (home)  DOB: 09/21/1940 MRN: [id number]  PRIMARY CARE PROVIDER:    [name], MD,  [name] [address] [phone]  REFERRING PROVIDER:  Lavone Neri, NP   RESPONSIBLE PARTY:   Spouse [phone] c Contact: IZTIWPY [phone], [place]     Name Relation Home Work Mobile   Mebanes,Denise Niece   [phone]   Marlena, Barbato [phone]  [phone]      I met face to face with patient and at facility. Palliative Care was asked to follow this patient by consultation request of  Danie Binder NP to address advance care planning, complex medical decision making and goals of care clarification.      ASSESSMENT AND / RECOMMENDATIONS:   CODE STATUS: Patient is a Full code  Goals of Care: Goals include to maximize quality of life and symptom management  Symptom Management/Plan: Dementia: Continue memantine as ordered.  Memory loss/confusion at baseline.  Encourage reminiscence, word search/puzzles, cueing for recollection.  Promote calm approach and engaging environment.  Safety and fall precautions.  incontinent of bowel and bladder FAST 6D.  Type 2 DM: Stable A1c 6.09 Jan 2022. Saw Endocrinologist 01/27/22. Continue Trulicity (Dulaglutide), Insulin as ordered. No concentrated sweets. Diabetic diet, no empty caloric beverages. Check CBG as ordered to help ensure tight control.  Repeat A1c in 3 to 6 months. CFH: Stable. Continue Torsemide as ordered. Elevate BLE during the day as much as possible to promote circulation. Adhere to Fluid and salt limits. Monitor weight per facility protocol.  Reports weight gain of 2 pounds in a day or 5 pounds in a week.  Follow up: Palliative care will continue to follow for complex medical  decision making, advance care planning, and clarification of goals. Return 6 weeks or prn. Encouraged to call provider sooner with any concerns.   Family /Caregiver/Community Supports: Patient is in SNF for ongoing care  HOSPICE ELIGIBILITY/DIAGNOSIS: TBD  Chief Complaint: Follow up visit  HISTORY OF PRESENT ILLNESS:  [name] is a 82 y.o. year old female  with multiple morbidities requiring close monitoring/management with high risk of complications and morbidity: Advanced Dementia,Type 2 DM. CHF, Anemia.  Patient denies pain/discomfort; states she would like to go back home. FLACC 0.  History obtained from review of EMR, discussion with primary team, caregiver, family and/or Ms. Gottwald.   Review and summarization of Epic records shows history from other than patient. Rest of 10 point ROS asked and negative.  I reviewed as needed, available labs, patient records, imaging, studies and related documents from the EMR.   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   Fatigue 07/10/2011   Type 2 diabetes mellitus ([place]) 09/15/2012   Chronic diastolic CHF (congestive heart failure) ([place]) 10/03/2013   Essential hypertension 03/02/2012   CKD (chronic kidney disease), stage III ([place])    Simple febrile convulsions ([place]) 10/21/2014   Dementia ([place]) 01/30/2015   Healthcare maintenance 06/20/2015   Seborrheic keratosis 06/20/2015   PAF (paroxysmal atrial fibrillation) ([facility]) 09/29/2015   Pain in joint of right shoulder 10/22/2015   Peripheral vascular disease ([place]) 12/30/2015   Dry skin 02/17/2016   Venous insufficiency of both lower extremities  04/27/2016   Epistaxis 07/30/2016   Osteopenia after menopause 01/13/2017   Overgrown toenails 03/18/2017   Pulmonary HTN ([place]) 04/01/2017   Heart murmur 04/01/2017   Lower extremity pain, bilateral 07/15/2017   Pain localized to  upper abdomen 10/05/2017   Leg weakness, bilateral 12/23/2017   Hyperpigmented skin lesion 02/07/2018   Iron deficiency anemia due to chronic blood loss 04/19/2018   Port-A-Cath in place 05/23/2018   Neuropathy 07/27/2019   Seizures ([place]) 09/2014   Hearing loss 05/29/2020   Bullous lesion 10/08/2020   History of GI bleed 01/18/2021   Resolved Ambulatory Problems    Diagnosis Date Noted   Type 2 diabetes mellitus with complication, with long-term current use of insulin ([place]) 04/08/2006   HYPERLIPIDEMIA 04/08/2006   Anemia, iron deficiency 04/21/2006   Iron deficiency anemia due to chronic blood loss 04/08/2006   HYPERTENSION, BENIGN SYSTEMIC 04/08/2006   SYMPTOM, EPISTAXIS 05/21/2006   Anemia 02/01/2011   GI bleed 02/01/2011   Hypokalemia 02/01/2011   Obesity 09/15/2012   Abnormal CXR 09/15/2012   Dyspnea 10/03/2013   Acute on chronic heart failure with preserved ejection fraction (HFpEF) ([place]) 10/03/2013   Upper GI bleed 07/05/2014   Poor venous access    SOB (shortness of breath) 07/27/2014   Acute kidney injury ([place]) 08/27/2014   Acute encephalopathy 08/27/2014   Acute renal failure superimposed on stage 3 chronic kidney disease ([place]) 08/27/2014   Anemia of chronic disease 08/27/2014   UTI (lower urinary tract infection) 08/27/2014   Anemia 09/04/2014   Acute lower GI bleeding 09/04/2014   Acute blood loss anemia    Hyperammonemia ([place]) 10/03/2014   Status epilepticus ([place]) 10/03/2014   Hypotension 11/13/2014   Symptomatic anemia 11/29/2014   Bleeding gastrointestinal    SOB (shortness of breath) 75/17/0017   Lichen planus 49/44/9675   Acute GI bleeding 09/28/2015   Acute on chronic blood loss anemia    Angiodysplasia of stomach with hemorrhage    AVM (arteriovenous malformation) of stomach, acquired with hemorrhage    Hyperglycemia due to type 2 diabetes mellitus ([place]) 05/21/2016   Muscle spasm of both lower legs 06/15/2016   Hematemesis 12/04/2017   Acute right  hip pain 03/10/2018   Hyperkalemia 10/18/2018   Hypervolemia due to congestive heart failure ([place]) 08/02/2019   Abnormal LFTs 08/09/2019   Impacted cerumen of left ear 07/15/2020   Hypoglycemia 09/29/2020   Acute upper GI bleed 12/23/2020   Hemorrhagic shock ([place])    Altered mental status 91/63/8466   Acute metabolic encephalopathy 59/93/5701   AKI (acute kidney injury) ([place])    Dehydration    AKI (acute kidney injury) ([place]) 01/28/2021   Altered mental status 02/03/2021   AMS (altered mental status) 02/04/2021   Acute upper GI bleed 11/21/2021   Past Medical History:  Diagnosis Date   Chronic anemia    Chronic GI bleeding    Family history of anesthesia complication    Frequent nosebleeds    Gastric AV malformation    GERD (gastroesophageal reflux disease)    History of blood transfusion "several"   History of epistaxis    HTN (hypertension), benign 03/02/2012   Hyperlipidemia    Iron deficiency anemia    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[place])    Pneumonia 1990's X 2   Telangiectasia    Type II diabetes mellitus ([place])     SOCIAL HX:  Social History   Tobacco Use   Smoking status: Former    Packs/day: 1.00    Years: 20.00  Total pack years: 20.00    Types: Cigarettes    Quit date: 02/10/1971    Years since quitting: 51.1   Smokeless tobacco: Never   Tobacco comments:    09/15/2012 "smoked 50-60 yr ago"  Substance Use Topics   Alcohol use: No    Alcohol/week: 0.0 standard drinks of alcohol     FAMILY HX:  Family History  Problem Relation Age of Onset   Healthy Mother    Stroke Father    Diabetes Niece    Breast cancer Other    Malignant hyperthermia Neg Hx    Seizures Neg Hx    Lung cancer Neg Hx       ALLERGIES:  Allergies  Allergen Reactions   Aspirin Nausea And Vomiting      PERTINENT MEDICATIONS:  Outpatient Encounter Medications as of 03/06/2022  Medication Sig   amLODipine (NORVASC) 5 MG tablet Take 1 tablet (5 mg total) by mouth daily.    bisacodyl (DULCOLAX) 10 MG suppository Place 10 mg rectally as needed for moderate constipation.   diclofenac Sodium (VOLTAREN) 1 % GEL Apply 2 g topically 4 (four) times daily.   gabapentin (NEURONTIN) 100 MG capsule Take 1 capsule (100 mg total) by mouth in the morning AND 2 capsules (200 mg total) at bedtime.   guaiFENesin (ROBITUSSIN) 100 MG/5ML liquid Take 200 mg by mouth every 4 (four) hours as needed for cough.   insulin lispro (HUMALOG KWIKPEN) 100 UNIT/ML KwikPen Inject 3 Units into the skin 3 (three) times daily. (Patient taking differently: Inject 5 Units into the skin 3 (three) times daily.)   lactulose (CHRONULAC) 10 GM/15ML solution Take 15 mLs by mouth in the morning, at noon, and at bedtime.   LANTUS SOLOSTAR 100 UNIT/ML Solostar Pen Inject 10 Units into the skin at bedtime.   levETIRAcetam (KEPPRA) 500 MG tablet Take 1 tablet (500 mg total) by mouth 2 (two) times daily.   Magnesium Hydroxide (MILK OF MAGNESIA PO) Take 30 mLs by mouth every evening.   memantine (NAMENDA) 10 MG tablet TAKE 1 TABLET BY MOUTH TWICE DAILY   Multiple Vitamins-Minerals (MULTI FOR HER 50+) TABS Take 1 tablet by mouth daily with breakfast.   pantoprazole (PROTONIX) 40 MG tablet Take 1 tablet (40 mg total) by mouth 2 (two) times daily.   torsemide (DEMADEX) 20 MG tablet Take 1 tablet (20 mg total) by mouth as needed (please take 1 '20mg'$  tablet as needed for weight gain of 2 pounds overnight or 5 pounds in a week).   triamcinolone cream (KENALOG) 0.1 % Apply 1 Application topically 2 (two) times daily.   TRULICITY 3 XV/4.0GQ SOPN Inject 3 mg into the skin once a week. (Patient not taking: Reported on 01/27/2022)   Facility-Administered Encounter Medications as of 03/06/2022  Medication   sodium chloride 0.9 % injection 10 mL   sodium chloride flush (NS) 0.9 % injection 10 mL   I spent 45 minutes providing this consultation; time includes spent with patient/family, chart review and documentation. More than  50% of the time in this consultation was spent on care coordination.  Thank you for the opportunity to participate in the care of Ms. Kilman.  The palliative care team will continue to follow. Please call our office at [phone] if we can be of additional assistance.   Note: Portions of this note were generated with [employer]. Dictation errors may occur despite best attempts at proofreading.  Teodoro Spray, NP

## 2022-03-09 ENCOUNTER — Encounter: Payer: Self-pay | Admitting: Hematology

## 2022-03-16 ENCOUNTER — Encounter (HOSPITAL_COMMUNITY): Payer: Self-pay

## 2022-03-16 ENCOUNTER — Other Ambulatory Visit: Payer: Self-pay

## 2022-03-16 ENCOUNTER — Emergency Department (HOSPITAL_COMMUNITY)
Admission: EM | Admit: 2022-03-16 | Discharge: 2022-03-17 | Disposition: A | Discharge status: Home / Self Care | Payer: Medicare Other | Attending: Emergency Medicine | Admitting: Emergency Medicine

## 2022-03-16 DIAGNOSIS — I110 Hypertensive heart disease with heart failure: Secondary | ICD-10-CM | POA: Insufficient documentation

## 2022-03-16 DIAGNOSIS — E119 Type 2 diabetes mellitus without complications: Secondary | ICD-10-CM | POA: Insufficient documentation

## 2022-03-16 DIAGNOSIS — S01512A Laceration without foreign body of oral cavity, initial encounter: Secondary | ICD-10-CM | POA: Diagnosis not present

## 2022-03-16 DIAGNOSIS — X58XXXA Exposure to other specified factors, initial encounter: Secondary | ICD-10-CM | POA: Diagnosis not present

## 2022-03-16 DIAGNOSIS — Z79899 Other long term (current) drug therapy: Secondary | ICD-10-CM | POA: Insufficient documentation

## 2022-03-16 DIAGNOSIS — Z794 Long term (current) use of insulin: Secondary | ICD-10-CM | POA: Diagnosis not present

## 2022-03-16 DIAGNOSIS — F0390 Unspecified dementia without behavioral disturbance: Secondary | ICD-10-CM | POA: Diagnosis not present

## 2022-03-16 DIAGNOSIS — I5032 Chronic diastolic (congestive) heart failure: Secondary | ICD-10-CM | POA: Diagnosis not present

## 2022-03-16 LAB — CBC WITH DIFFERENTIAL/PLATELET
Abs Immature Granulocytes: 0.02 10*3/uL (ref 0.00–0.07)
Basophils Absolute: 0 10*3/uL (ref 0.0–0.1)
Basophils Relative: 1 %
Eosinophils Absolute: 0.4 10*3/uL (ref 0.0–0.5)
Eosinophils Relative: 7 %
HCT: 35.8 % — ABNORMAL LOW (ref 36.0–46.0)
Hemoglobin: 11.4 g/dL — ABNORMAL LOW (ref 12.0–15.0)
Immature Granulocytes: 0 %
Lymphocytes Relative: 16 %
Lymphs Abs: 0.9 10*3/uL (ref 0.7–4.0)
MCH: 28.1 pg (ref 26.0–34.0)
MCHC: 31.8 g/dL (ref 30.0–36.0)
MCV: 88.4 fL (ref 80.0–100.0)
Monocytes Absolute: 0.6 10*3/uL (ref 0.1–1.0)
Monocytes Relative: 10 %
Neutro Abs: 4 10*3/uL (ref 1.7–7.7)
Neutrophils Relative %: 66 %
Platelets: 151 10*3/uL (ref 150–400)
RBC: 4.05 MIL/uL (ref 3.87–5.11)
RDW: 19.1 % — ABNORMAL HIGH (ref 11.5–15.5)
WBC: 6 10*3/uL (ref 4.0–10.5)
nRBC: 0 % (ref 0.0–0.2)

## 2022-03-16 LAB — COMPREHENSIVE METABOLIC PANEL
ALT: 21 U/L (ref 0–44)
AST: 44 U/L — ABNORMAL HIGH (ref 15–41)
Albumin: 1.9 g/dL — ABNORMAL LOW (ref 3.5–5.0)
Alkaline Phosphatase: 175 U/L — ABNORMAL HIGH (ref 38–126)
Anion gap: 9 (ref 5–15)
BUN: 16 mg/dL (ref 8–23)
CO2: 24 mmol/L (ref 22–32)
Calcium: 8.7 mg/dL — ABNORMAL LOW (ref 8.9–10.3)
Chloride: 111 mmol/L (ref 98–111)
Creatinine, Ser: 0.95 mg/dL (ref 0.44–1.00)
GFR, Estimated: 60 mL/min — ABNORMAL LOW (ref >60)
Glucose, Bld: 80 mg/dL (ref 70–99)
Potassium: 3.9 mmol/L (ref 3.5–5.1)
Sodium: 144 mmol/L (ref 135–145)
Total Bilirubin: 0.3 mg/dL (ref 0.3–1.2)
Total Protein: 4.8 g/dL — ABNORMAL LOW (ref 6.5–8.1)

## 2022-03-16 LAB — CBG MONITORING, ED: Glucose-Capillary: 129 mg/dL — ABNORMAL HIGH (ref 70–99)

## 2022-03-16 NOTE — Discharge Instructions (Addendum)
You were seen in the ED for your tongue laceration, while in the Ed we completed a full history and physical exam, based off of our findings we feel that you are suitable for discharge at this time. Sincerely, Clydie Braun, PGY-2

## 2022-03-16 NOTE — ED Notes (Signed)
Attempted to call [facility] about pt's discharge instructions, no answer.

## 2022-03-16 NOTE — ED Notes (Signed)
PTAR CALLED PT IS NEXT ON THE LIST

## 2022-03-16 NOTE — ED Provider Notes (Signed)
[facility] Provider Note   CSN: [id number] Arrival date & time: 03/16/22  1845     History {Add pertinent medical, surgical, social history, OB history to HPI:1} Chief Complaint  Patient presents with  . Oral Swelling    [name] is a 82 y.o. female.  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   Patient presents to the department tonight after spitting up a small amount of blood.  Patient states she was eating dinner of peaches and oranges she specifically mention she was not eating anything red when she noted that she had melena and blood in her mouth that she spit up.  She states that she has been feeling her usual state of health lately she denies any weakness, dizziness or dyspnea on exertion.  She states that she has not had any hematemesis or other episodes of spitting up blood in the past week or so.  She states that she has also not had any dark stools or bright red blood per rectum.  She denies any trauma to the mouth.        Home Medications Prior to Admission medications   Medication Sig Start Date End Date Taking? Authorizing Provider  amLODipine (NORVASC) 5 MG tablet Take 1 tablet (5 mg total) by mouth daily. 09/25/21 09/20/22  Lacinda Axon, MD  bisacodyl (DULCOLAX) 10 MG suppository Place 10 mg rectally as needed for moderate constipation.    [provider]  diclofenac Sodium (VOLTAREN) 1 % GEL Apply 2 g topically 4 (four) times daily. 12/26/20   Lacinda Axon, MD  gabapentin (NEURONTIN) 100 MG capsule Take 1 capsule (100 mg total) by mouth in the morning AND 2 capsules (200 mg total) at bedtime. 12/08/21   Sanjuan Dame, MD  guaiFENesin (ROBITUSSIN) 100  MG/5ML liquid Take 200 mg by mouth every 4 (four) hours as needed for cough.    [provider]  insulin lispro (HUMALOG KWIKPEN) 100 UNIT/ML KwikPen Inject 3 Units into the skin 3 (three) times daily. Patient taking differently: Inject 5 Units into the skin 3 (three) times daily. 09/25/21   Lacinda Axon, MD  lactulose (CHRONULAC) 10 GM/15ML solution Take 15 mLs by mouth in the morning, at noon, and at bedtime. 09/27/21   [provider]  LANTUS SOLOSTAR 100 UNIT/ML Solostar Pen Inject 10 Units into the skin at bedtime. 09/25/21   Lacinda Axon, MD  levETIRAcetam (KEPPRA) 500 MG tablet Take 1 tablet (500 mg total) by mouth 2 (two) times daily. 02/12/21   Orvis Brill, MD  Magnesium Hydroxide (MILK OF MAGNESIA PO) Take 30 mLs by mouth every evening.    [provider]  memantine (NAMENDA) 10 MG tablet TAKE 1 TABLET BY MOUTH TWICE DAILY 11/11/20   [name], Areeg N, DO  Multiple Vitamins-Minerals (MULTI FOR HER 50+) TABS Take 1 tablet by mouth daily with breakfast.    [provider]  pantoprazole (PROTONIX) 40 MG tablet Take 1 tablet (40 mg total) by mouth 2 (two) times daily. 12/26/20   Lacinda Axon, MD  torsemide (DEMADEX) 20 MG tablet Take 1 tablet (20 mg total) by mouth as needed (please take 1 '20mg'$  tablet as needed for weight gain of 2 pounds overnight or 5 pounds in a week). 03/05/21  01/27/22  Lenna Sciara, NP  triamcinolone cream (KENALOG) 0.1 % Apply 1 Application topically 2 (two) times daily.    [provider]  TRULICITY 3 NF/6.2ZH SOPN Inject 3 mg into the skin once a week. Patient not taking: Reported on 01/27/2022 09/25/21   Lacinda Axon, MD      Allergies    Aspirin    Review of Systems   Review of Systems  Physical Exam Updated Vital Signs Ht '5\' 3"'$  (1.6 m)   Wt 58.1 kg   LMP  (LMP Unknown)   BMI 22.67 kg/m  Physical Exam Vitals and nursing note reviewed.  Constitutional:      General: She is not in  acute distress.    Appearance: She is well-developed.  HENT:     Head: Normocephalic and atraumatic.     Nose: No congestion or rhinorrhea.     Comments: No signs of epistaxis     Mouth/Throat:     Comments: Small contusion to the posterior tongue Eyes:     Conjunctiva/sclera: Conjunctivae normal.     Pupils: Pupils are equal, round, and reactive to light.  Cardiovascular:     Rate and Rhythm: Normal rate and regular rhythm.     Heart sounds: No murmur heard. Pulmonary:     Effort: Pulmonary effort is normal. No respiratory distress.     Breath sounds: Normal breath sounds.  Abdominal:     Palpations: Abdomen is soft.     Tenderness: There is no abdominal tenderness.  Musculoskeletal:        General: No swelling.     Cervical back: Neck supple.  Skin:    General: Skin is warm and dry.     Capillary Refill: Capillary refill takes less than 2 seconds.  Neurological:     Mental Status: She is alert.  Psychiatric:        Mood and Affect: Mood normal.    ED Results / Procedures / Treatments   Labs (all labs ordered are listed, but only abnormal results are displayed) Labs Reviewed - No data to display  EKG None  Radiology No results found.  Procedures Procedures  {Document cardiac monitor, telemetry assessment procedure when appropriate:1}  Medications Ordered in ED Medications - No data to display  ED Course/ Medical Decision Making/ A&P   {   Click here for ABCD2, HEART and other calculatorsREFRESH Note before signing :1}                          Medical Decision Making [name] is an 82 year old female past medical history as documented above presenting to the emergency department for concern of bloody spit up.  While in the emergency department patient is normotensive and nontachycardic she is resting comfortably on my initial exam she has no evidence of bleeding from her mouth or her naris.  On examination of the patient's nostrils no evidence of acute  epistaxis.  Patient does have a prior history of HHT with history of upper GI bleed although the bleed tonight seems like it was rather minor in nature and she is hemodynamically stable on presentation therefore I am not concerned for a brisk upper GI bleed or lower GI bleed.  Given her history we are opting to obtain a CBC and a CMP to recheck her hemoglobin.   Amount and/or Complexity of Data Reviewed Labs: ordered.   ***  {Document critical care time when appropriate:1} {Document review  of labs and clinical decision tools ie heart score, Chads2Vasc2 etc:1}  {Document your independent review of radiology images, and any outside records:1} {Document your discussion with family members, caretakers, and with consultants:1} {Document social determinants of health affecting pt's care:1} {Document your decision making why or why not admission, treatments were needed:1} Final Clinical Impression(s) / ED Diagnoses Final diagnoses:  None    Rx / DC Orders ED Discharge Orders     None

## 2022-03-16 NOTE — ED Triage Notes (Signed)
Patient from [facility] and husband saw small amount of bright red blood from mouth and staff evaluated and noticed she had a small cut on tongue and the bleeding had stopped but husband wants patient evaluated for GI bleed due to her hx.  Husband argued with ems and snf staff about taking her to the hospital to be evaluated.

## 2022-03-17 NOTE — ED Notes (Signed)
Report given to PTAR pt to be transported to [facility]

## 2022-03-18 ENCOUNTER — Telehealth: Payer: Self-pay

## 2022-03-18 NOTE — Telephone Encounter (Signed)
Called patients niece and let her know that the Iron level is good and patient doesn't need an IV iron infusion for now as per Dr. Burr Medico

## 2022-03-31 ENCOUNTER — Telehealth: Payer: Self-pay | Admitting: Hematology

## 2022-03-31 NOTE — Telephone Encounter (Signed)
Contacted patient to scheduled appointments. Patient is aware of appointments that are scheduled.   

## 2022-04-16 ENCOUNTER — Ambulatory Visit: Payer: Medicare Other | Admitting: Endocrinology

## 2022-04-21 ENCOUNTER — Ambulatory Visit: Payer: Medicare Other | Admitting: Endocrinology

## 2022-04-23 ENCOUNTER — Ambulatory Visit (INDEPENDENT_AMBULATORY_CARE_PROVIDER_SITE_OTHER): Payer: Medicare Other | Admitting: Endocrinology

## 2022-04-23 ENCOUNTER — Encounter: Payer: Self-pay | Admitting: Endocrinology

## 2022-04-23 VITALS — BP 146/70 | HR 66 | Ht 63.0 in

## 2022-04-23 DIAGNOSIS — E1165 Type 2 diabetes mellitus with hyperglycemia: Secondary | ICD-10-CM

## 2022-04-23 DIAGNOSIS — Z794 Long term (current) use of insulin: Secondary | ICD-10-CM | POA: Diagnosis not present

## 2022-04-23 LAB — POCT GLYCOSYLATED HEMOGLOBIN (HGB A1C): Hemoglobin A1C: 5.8 % — AB (ref 4.0–5.6)

## 2022-04-23 NOTE — Progress Notes (Signed)
Agree water.  Skin skin skin experience just just just patient ID: [name], female   DOB: [birth year], 82 y.o.   MRN: [id number]           Reason for Appointment: Follow-up for Type 2 Diabetes  Referring PCP: Koleen Distance   History of Present Illness:          Date of diagnosis of type 2 diabetes mellitus: 1988       Background history:   Patient has been on insulin for a few years and she is unclear when this was started, according to Epic she was started on Levemir in 2015 Prior to that likely had been on various oral hypoglycemic agents including metformin, glipizide and Tradjenta Her level of control has been somewhat variable but not consistently poor; her A1c has ranged between 5.6 and 7.6  Recent history:   Her A1c is better than usual at 5.8  INSULIN regimen is: Lantus 10 units at bedtime, lispro 5 units before each meal  Non-insulin hypoglycemic drugs prescribed: Trulicity 3 mg weekly  Current management, blood sugar patterns and problems identified:  She is to be only taking Lantus 8 units instead of 10 units at night on the last visit in December but the nursing home has not changed her dosage Although she has some variability in her blood sugars her fasting appears to be relatively lower and as low as 67 recently No other hypoglycemia seen recently Most of her other blood sugars appear to be fairly well-controlled with only sporadically will have some readings over 200 after dinner but again not consistent Difficult to analyze her blood sugars since they are only listed continuously and not supported Her attendant says that she is sometimes not eating breakfast because she sleeps later Also may only 40% of her meal at times Also continuing 3 mg Trulicity Not able to weigh her today but current weight history appears to be stable       Side effects from medications have been: None known     Meal times are:  Breakfast is at variable times between 8 AM-9  Dinner 7  pm             Exercise:  Unable to do any  Glucose monitoring: As below      glucometer: Unknown brand  Blood Glucose readings from nursing home record for the last 5 days  Fasting readings range from 67-118 recently Blood sugars are over 200 sporadically after dinner are before dinner but not consistent  Dietician visit, most recent: 11/2018  Weight history:  Wt Readings from Last 3 Encounters:  03/16/22 128 lb (58.1 kg)  12/19/21 128 lb 8.5 oz (58.3 kg)  11/22/21 128 lb 8.5 oz (58.3 kg)    Glycemic control:   Lab Results  Component Value Date   HGBA1C 5.8 (A) 04/23/2022   HGBA1C 6.1 (A) 01/22/2022   HGBA1C 7.3 (A) 09/25/2021   Lab Results  Component Value Date   MICROALBUR 1,289.4 (H) 03/07/2019   LDLCALC 57 01/30/2019   CREATININE 0.95 03/16/2022   Lab Results  Component Value Date   MICRALBCREAT 2,116 (H) 09/25/2021    Lab Results  Component Value Date   FRUCTOSAMINE 404 (H) 10/31/2019   FRUCTOSAMINE 256 06/26/2019    Lab Results  Component Value Date   HGB 11.4 (L) 03/16/2022     Allergies as of 04/23/2022       Reactions   Aspirin Nausea And Vomiting  Medication List        Accurate as of April 23, 2022 11:52 AM. If you have any questions, ask your nurse or doctor.          acetaminophen 325 MG tablet Commonly known as: TYLENOL Take 650 mg by mouth every 4 (four) hours as needed for mild pain.   amLODipine 5 MG tablet Commonly known as: NORVASC Take 1 tablet (5 mg total) by mouth daily.   ascorbic acid 250 MG tablet Commonly known as: VITAMIN C Take 250 mg by mouth 2 (two) times daily.   bisacodyl 10 MG suppository Commonly known as: DULCOLAX Place 10 mg rectally daily as needed (constipation).   ferrous sulfate 325 (65 FE) MG tablet Take 325 mg by mouth 2 (two) times daily.   gabapentin 100 MG capsule Commonly known as: NEURONTIN Take 1 capsule (100 mg total) by mouth in the morning AND 2 capsules (200 mg total)  at bedtime. What changed: See the new instructions.   guaiFENesin 100 MG/5ML liquid Commonly known as: ROBITUSSIN Take 200 mg by mouth every 4 (four) hours as needed for cough.   insulin lispro 100 UNIT/ML KwikPen Commonly known as: HumaLOG KwikPen Inject 3 Units into the skin 3 (three) times daily. What changed:  how much to take when to take this additional instructions   lactulose 10 GM/15ML solution Commonly known as: CHRONULAC Take 10 g by mouth 3 (three) times daily.   Lantus SoloStar 100 UNIT/ML Solostar Pen Generic drug: insulin glargine Inject 10 Units into the skin at bedtime.   levETIRAcetam 500 MG tablet Commonly known as: KEPPRA Take 1 tablet (500 mg total) by mouth 2 (two) times daily.   memantine 10 MG tablet Commonly known as: NAMENDA TAKE 1 TABLET BY MOUTH TWICE DAILY   MILK OF MAGNESIA PO Take 30 mLs by mouth daily.   mirtazapine 7.5 MG tablet Commonly known as: REMERON Take 7.5 mg by mouth at bedtime.   multivitamin tablet Take 1 tablet by mouth daily.   omeprazole 40 MG capsule Commonly known as: PRILOSEC Take 40 mg by mouth 2 (two) times daily.   oxymetazoline 0.05 % nasal spray Commonly known as: AFRIN Place 2 sprays into both nostrils every 6 (six) hours as needed (nose bleeds).   PROTEIN PO Take 30 mLs by mouth daily.   torsemide 20 MG tablet Commonly known as: DEMADEX Take 1 tablet (20 mg total) by mouth as needed (please take 1 20mg  tablet as needed for weight gain of 2 pounds overnight or 5 pounds in a week). What changed:  when to take this reasons to take this   triamcinolone ointment 0.5 % Commonly known as: KENALOG Apply 1 Application topically 2 (two) times daily.   Trulicity 3 0000000 Sopn Generic drug: Dulaglutide Inject 3 mg into the skin once a week. What changed: when to take this        Allergies:  Allergies  Allergen Reactions   Aspirin Nausea And Vomiting    Past Medical History:  Diagnosis Date    Chronic anemia    Chronic diastolic CHF (congestive heart failure) ([place]) 10/03/2013   Chronic GI bleeding    /notes 11/29/2014   Family history of anesthesia complication    "niece has a hard time coming out" (09/15/2012)   Frequent nosebleeds    chronic   Gastric AV malformation    /notes 11/29/2014   GERD (gastroesophageal reflux disease)    Heart murmur 04/01/2017   Moderate AVSC on echo  09/2016   Hematemesis 12/04/2017   History of blood transfusion "several"   History of epistaxis    HTN (hypertension), benign 03/02/2012   Hyperlipidemia    Iron deficiency anemia    chronic infusions"   Lichen planus    Both lower extremities   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    Past Surgical History:  Procedure Laterality Date   CATARACT EXTRACTION     "I think it was just one eye"   ESOPHAGOGASTRODUODENOSCOPY  02/26/2011   Procedure: ESOPHAGOGASTRODUODENOSCOPY (EGD);  Surgeon: [name], MD;  Location: [name];  Service: Endoscopy;  Laterality: N/A;   ESOPHAGOGASTRODUODENOSCOPY N/A 11/08/2012   Procedure: ESOPHAGOGASTRODUODENOSCOPY (EGD);  Surgeon: [name], MD;  Location: [name];  Service: Endoscopy;  Laterality: N/A;   ESOPHAGOGASTRODUODENOSCOPY N/A 10/04/2013   Procedure: ESOPHAGOGASTRODUODENOSCOPY (EGD);  Surgeon: Winfield Cunas., MD;  Location: [name];  Service: Endoscopy;  Laterality: N/A;  with APC on stand-by   ESOPHAGOGASTRODUODENOSCOPY N/A 07/06/2014   Procedure: ESOPHAGOGASTRODUODENOSCOPY (EGD);  Surgeon: Clarene Essex, MD;  Location: [name];  Service: Endoscopy;  Laterality: N/A;   ESOPHAGOGASTRODUODENOSCOPY N/A 09/05/2014   Procedure:  ESOPHAGOGASTRODUODENOSCOPY (EGD);  Surgeon: Laurence Spates, MD;  Location: [name];  Service: Endoscopy;  Laterality: N/A;  APC on standby to control bleeding   ESOPHAGOGASTRODUODENOSCOPY N/A 11/29/2014   Procedure: ESOPHAGOGASTRODUODENOSCOPY (EGD);  Surgeon: [name], MD;  Location[facility] ENDOSCOPY;  Service: Endoscopy;  Laterality: N/A;   ESOPHAGOGASTRODUODENOSCOPY N/A 09/28/2015   Procedure: ESOPHAGOGASTRODUODENOSCOPY (EGD);  Surgeon: Clarene Essex, MD;  Location: [facility] ENDOSCOPY;  Service: Endoscopy;  Laterality: N/A;   ESOPHAGOGASTRODUODENOSCOPY N/A 11/22/2021   Procedure: ESOPHAGOGASTRODUODENOSCOPY (EGD);  Surgeon: Loney Laurence, DO;  Location: [name];  Service: Gastroenterology;  Laterality: N/A;   ESOPHAGOGASTRODUODENOSCOPY (EGD) WITH PROPOFOL N/A 12/04/2017   Procedure: ESOPHAGOGASTRODUODENOSCOPY (EGD) WITH PROPOFOL;  Surgeon: [name], MD;  Location: [place];  Service: Endoscopy;  Laterality: N/A;   ESOPHAGOGASTRODUODENOSCOPY (EGD) WITH PROPOFOL N/A 12/24/2020   Procedure: ESOPHAGOGASTRODUODENOSCOPY (EGD) WITH PROPOFOL;  Surgeon: Arta Silence, MD;  Location: [place];  Service: Endoscopy;  Laterality: N/A;   ESOPHAGOGASTRODUODENOSCOPY (EGD) WITH PROPOFOL N/A 01/19/2021   Procedure: ESOPHAGOGASTRODUODENOSCOPY (EGD) WITH PROPOFOL;  Surgeon: [name], MD;  Location: [place];  Service: Endoscopy;  Laterality: N/A;   ESOPHAGOGASTRODUODENOSCOPY (EGD) WITH PROPOFOL N/A 02/06/2021   Procedure: ESOPHAGOGASTRODUODENOSCOPY (EGD) WITH PROPOFOL;  Surgeon: Ronnette Juniper, MD;  Location: [place];  Service: Gastroenterology;  Laterality: N/A;   ESOPHAGOGASTRODUODENOSCOPY ENDOSCOPY  08/19/2006   with laser treatment   HEMOSTASIS CLIP PLACEMENT  12/24/2020   Procedure: HEMOSTASIS CLIP PLACEMENT;  Surgeon: Arta Silence, MD;  Location: [facility] ENDOSCOPY;  Service: Endoscopy;;   HEMOSTASIS CLIP PLACEMENT  01/19/2021   Procedure: HEMOSTASIS CLIP PLACEMENT;   Surgeon: [name], MD;  Location: [facility] ENDOSCOPY;  Service: Endoscopy;;   HEMOSTASIS CLIP PLACEMENT  11/22/2021   Procedure: HEMOSTASIS CLIP PLACEMENT;  Surgeon: Loney Laurence, DO;  Location: [name];  Service: Gastroenterology;;   HOT HEMOSTASIS N/A 07/06/2014   Procedure: HOT HEMOSTASIS (ARGON PLASMA COAGULATION/BICAP);  Surgeon: Clarene Essex, MD;  Location: [name];  Service: Endoscopy;  Laterality: N/A;   HOT  HEMOSTASIS N/A 09/28/2015   Procedure: HOT HEMOSTASIS (ARGON PLASMA COAGULATION/BICAP);  Surgeon: Clarene Essex, MD;  Location: [facility] ENDOSCOPY;  Service: Endoscopy;  Laterality: N/A;   HOT HEMOSTASIS N/A 12/04/2017   Procedure: HOT HEMOSTASIS (ARGON PLASMA COAGULATION/BICAP);  Surgeon: [name], MD;  Location: [place];  Service: Endoscopy;  Laterality: N/A;   HOT HEMOSTASIS  11/22/2021   Procedure: HOT HEMOSTASIS (ARGON PLASMA COAGULATION/BICAP);  Surgeon: Loney Laurence, DO;  Location: [name];  Service: Gastroenterology;;   Chrystine Oiler SELECTIVE EACH ADDITIONAL VESSEL  02/05/2021   IR ANGIOGRAM VISCERAL SELECTIVE  02/05/2021   IR EMBO ART  VEN HEMORR LYMPH EXTRAV  INC GUIDE ROADMAPPING  02/05/2021   IR US GUIDE VASC ACCESS RIGHT  02/05/2021   NASAL HEMORRHAGE CONTROL     "for bleeding"    SAVORY DILATION  02/26/2011   Procedure: SAVORY DILATION;  Surgeon: [name], MD;  Location: [name];  Service: Endoscopy;  Laterality: N/A;  c-arm needed   SCLEROTHERAPY  12/24/2020   Procedure: SCLEROTHERAPY;  Surgeon: Arta Silence, MD;  Location: [facility] ENDOSCOPY;  Service: Endoscopy;;   SCLEROTHERAPY  01/19/2021   Procedure: Clide Deutscher;  Surgeon: [name], MD;  Location: [facility] ENDOSCOPY;  Service: Endoscopy;;   SUBMUCOSAL INJECTION  12/04/2017   Procedure: SUBMUCOSAL INJECTION;  Surgeon: [name], MD;  Location: [facility] ENDOSCOPY;  Service: Endoscopy;;    Family History  Problem Relation Age of Onset   Healthy Mother    Stroke  Father    Diabetes Niece    Breast cancer Other    Malignant hyperthermia Neg Hx    Seizures Neg Hx    Lung cancer Neg Hx     Social History:  reports that she quit smoking about 51 years ago. Her smoking use included cigarettes. She has a 20.00 pack-year smoking history. She has never used smokeless tobacco. She reports that she does not drink alcohol and does not use drugs.   Review of Systems   Lipid history: Not on a statin drug    Lab Results  Component Value Date   CHOL 133 01/30/2019   HDL 41.30 01/30/2019   LDLCALC 57 01/30/2019   LDLDIRECT 148 (H) 03/05/2021   TRIG 176.0 (H) 01/30/2019   CHOLHDL 3 01/30/2019           Hypertension: Treated with amlodipine, also on Demadex  BP Readings from Last 3 Encounters:  04/23/22 (!) 146/70  03/16/22 (!) 129/47  01/28/22 (!) 118/51   Renal function history: Has variable creatinine levels   Lab Results  Component Value Date   CREATININE 0.95 03/16/2022   CREATININE 1.28 (H) 01/28/2022   CREATININE 1.27 (H) 12/19/2021   Lab Results  Component Value Date   K 3.9 99991111     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, microalbuminuria     Physical Examination:  BP (!) 146/70 (BP Location: Left Arm, Patient Position: Sitting, Cuff Size: Normal)   Pulse 66   Ht 5\' 3"  (1.6 m)   LMP  (LMP Unknown)   SpO2 96%   BMI 22.67 kg/m    ASSESSMENT:  Diabetes type 2 on insulin  See history of present illness for detailed discussion of current diabetes management, blood sugar patterns and problems identified  A1c is 5.8 but not clear if this is accurate As before she has significant anemia  She is currently on Trulicity 3 mg, Humalog 5 units before meals, Lantus 10 units at night night from nursing home records  Blood sugars are overall  fairly well-controlled and being monitored 4 times a day As before she has some low normal readings fasting May be having relatively low appetite because of Trulicity  and  PLAN:     Instructions were written for the nursing home on the consultation sheet  New orders as of today given to the nursing home and writing  Reduce LANTUS insulin to 8 units If blood sugars are below 120 she can take insulin right after eating but still take 2 units If she is eating only half of her meals he will get half of the insulin also Reduce Trulicity to 1.5 mg weekly   There are no Patient Instructions on file for this visit.   Elayne Snare 04/23/2022, 11:52 AM   Note: This office note was prepared with Dragon voice recognition system technology. Any transcriptional errors that result from this process are unintentional.

## 2022-05-01 ENCOUNTER — Inpatient Hospital Stay: Payer: Medicare Other | Attending: Hematology

## 2022-05-01 ENCOUNTER — Non-Acute Institutional Stay: Payer: Medicare Other | Admitting: Hospice

## 2022-05-01 ENCOUNTER — Inpatient Hospital Stay: Payer: Medicare Other | Admitting: Hematology

## 2022-05-01 DIAGNOSIS — F0390 Unspecified dementia without behavioral disturbance: Secondary | ICD-10-CM

## 2022-05-01 DIAGNOSIS — I5032 Chronic diastolic (congestive) heart failure: Secondary | ICD-10-CM

## 2022-05-01 DIAGNOSIS — Z515 Encounter for palliative care: Secondary | ICD-10-CM

## 2022-05-01 DIAGNOSIS — E119 Type 2 diabetes mellitus without complications: Secondary | ICD-10-CM

## 2022-05-01 NOTE — Progress Notes (Deleted)
[place]   [phone] [phone]   Clinic Follow up Note   Patient Care Team: [name], MD as PCP - General [name], MD as PCP - Cardiology (Cardiology) Truitt Merle, MD as Consulting Physician (Hematology) Elayne Snare, MD as Consulting Physician (Endocrinology)  Date of Service:  05/01/2022  CHIEF COMPLAINT: f/u of HHT, anemia   CURRENT THERAPY:   1. Iv feraheme as needed if ferritin<30, latest on 07/30/21 2. Avastin 5mg /kg, as needed for anemia, starting 05/09/18.              -given intermittently due to protenuria 3. Blood transfusion if Hg<8.0    ASSESSMENT: *** [name] is a 82 y.o. female with   No problem-specific Assessment & Plan notes found for this encounter.  ***   PLAN: {Everything Dr. Burr Medico talks to pt about, including reviewing scans and labs. } -{proceed with ***} -{lab with/without flush and f/u when?}   SUMMARY OF ONCOLOGIC HISTORY: Oncology History   No history exists.     INTERVAL HISTORY:  [place] is here for a follow up of HHT, anemia  She was last seen by me on 11/06/2021 She presents to the clinic      All other systems were reviewed with the patient and are negative.  MEDICAL HISTORY:  Past Medical History:  Diagnosis Date   Chronic anemia    Chronic diastolic CHF (congestive heart failure) ([place]) 10/03/2013   Chronic GI bleeding    /notes 11/29/2014   Family history of anesthesia complication    "niece has a hard time coming out" (09/15/2012)   Frequent nosebleeds    chronic   Gastric AV malformation    /notes 11/29/2014   GERD (gastroesophageal reflux disease)    Heart murmur 04/01/2017   Moderate AVSC on echo 09/2016   Hematemesis 12/04/2017   History of blood transfusion "several"   History of epistaxis    HTN (hypertension), benign 03/02/2012   Hyperlipidemia    Iron deficiency anemia    chronic infusions"   Lichen planus    Both lower extremities    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    SURGICAL HISTORY: Past Surgical History:  Procedure Laterality Date   CATARACT EXTRACTION     "I think it was just one eye"   ESOPHAGOGASTRODUODENOSCOPY  02/26/2011   Procedure: ESOPHAGOGASTRODUODENOSCOPY (EGD);  Surgeon: [name], MD;  Location: [name];  Service: Endoscopy;  Laterality: N/A;   ESOPHAGOGASTRODUODENOSCOPY N/A 11/08/2012   Procedure: ESOPHAGOGASTRODUODENOSCOPY (EGD);  Surgeon: [name], MD;  Location: [name];  Service: Endoscopy;  Laterality: N/A;   ESOPHAGOGASTRODUODENOSCOPY N/A 10/04/2013   Procedure: ESOPHAGOGASTRODUODENOSCOPY (EGD);  Surgeon: Winfield Cunas., MD;  Location: [name];  Service: Endoscopy;  Laterality: N/A;  with APC on stand-by   ESOPHAGOGASTRODUODENOSCOPY N/A 07/06/2014   Procedure: ESOPHAGOGASTRODUODENOSCOPY (EGD);  Surgeon: Clarene Essex, MD;  Location: [name];  Service: Endoscopy;  Laterality: N/A;   ESOPHAGOGASTRODUODENOSCOPY N/A 09/05/2014   Procedure: ESOPHAGOGASTRODUODENOSCOPY (EGD);  Surgeon: Laurence Spates, MD;  Location: [name];  Service: Endoscopy;  Laterality: N/A;  APC on standby to  control bleeding   ESOPHAGOGASTRODUODENOSCOPY N/A 11/29/2014   Procedure: ESOPHAGOGASTRODUODENOSCOPY (EGD);  Surgeon: [name], MD;  Location: [facility] ENDOSCOPY;  Service: Endoscopy;  Laterality: N/A;   ESOPHAGOGASTRODUODENOSCOPY N/A 09/28/2015   Procedure: ESOPHAGOGASTRODUODENOSCOPY (EGD);  Surgeon: Clarene Essex, MD;  Location: [facility] ENDOSCOPY;  Service: Endoscopy;  Laterality: N/A;   ESOPHAGOGASTRODUODENOSCOPY N/A 11/22/2021   Procedure: ESOPHAGOGASTRODUODENOSCOPY (EGD);  Surgeon: Loney Laurence, DO;  Location: [name];  Service: Gastroenterology;  Laterality: N/A;   ESOPHAGOGASTRODUODENOSCOPY (EGD) WITH PROPOFOL N/A 12/04/2017   Procedure: ESOPHAGOGASTRODUODENOSCOPY (EGD) WITH PROPOFOL;  Surgeon: [name], MD;  Location: [place];  Service: Endoscopy;  Laterality: N/A;   ESOPHAGOGASTRODUODENOSCOPY (EGD) WITH PROPOFOL N/A 12/24/2020   Procedure: ESOPHAGOGASTRODUODENOSCOPY (EGD) WITH PROPOFOL;  Surgeon: Arta Silence, MD;  Location: [place];  Service: Endoscopy;  Laterality: N/A;   ESOPHAGOGASTRODUODENOSCOPY (EGD) WITH PROPOFOL N/A 01/19/2021   Procedure: ESOPHAGOGASTRODUODENOSCOPY (EGD) WITH PROPOFOL;  Surgeon: [name], MD;  Location: [place];  Service: Endoscopy;  Laterality: N/A;   ESOPHAGOGASTRODUODENOSCOPY (EGD) WITH PROPOFOL N/A 02/06/2021   Procedure: ESOPHAGOGASTRODUODENOSCOPY (EGD) WITH PROPOFOL;  Surgeon: Ronnette Juniper, MD;  Location: [facility];  Service: Gastroenterology;  Laterality: N/A;   ESOPHAGOGASTRODUODENOSCOPY ENDOSCOPY  08/19/2006   with laser treatment   HEMOSTASIS CLIP PLACEMENT  12/24/2020   Procedure: HEMOSTASIS CLIP PLACEMENT;  Surgeon: Arta Silence, MD;  Location[facility] ENDOSCOPY;  Service: Endoscopy;;   HEMOSTASIS CLIP PLACEMENT  01/19/2021   Procedure: HEMOSTASIS CLIP PLACEMENT;  Surgeon: [name], MD;  Location: [facility] ENDOSCOPY;  Service: Endoscopy;;   HEMOSTASIS CLIP PLACEMENT  11/22/2021   Procedure: HEMOSTASIS CLIP PLACEMENT;  Surgeon: Loney Laurence, DO;  Location: [name];  Service: Gastroenterology;;   HOT HEMOSTASIS N/A 07/06/2014   Procedure: HOT HEMOSTASIS (ARGON PLASMA COAGULATION/BICAP);  Surgeon: Clarene Essex, MD;  Location: [name];  Service: Endoscopy;  Laterality: N/A;   HOT HEMOSTASIS N/A 09/28/2015   Procedure: HOT HEMOSTASIS (ARGON PLASMA COAGULATION/BICAP);  Surgeon: Clarene Essex, MD;  Location: [facility] ENDOSCOPY;  Service: Endoscopy;  Laterality: N/A;   HOT HEMOSTASIS N/A  12/04/2017   Procedure: HOT HEMOSTASIS (ARGON PLASMA COAGULATION/BICAP);  Surgeon: [name], MD;  Location: [place];  Service: Endoscopy;  Laterality: N/A;   HOT HEMOSTASIS  11/22/2021   Procedure: HOT HEMOSTASIS (ARGON PLASMA COAGULATION/BICAP);  Surgeon: Loney Laurence, DO;  Location: [name];  Service: Gastroenterology;;   Chrystine Oiler SELECTIVE EACH ADDITIONAL VESSEL  02/05/2021   IR ANGIOGRAM VISCERAL SELECTIVE  02/05/2021   IR EMBO ART  VEN HEMORR LYMPH EXTRAV  INC GUIDE ROADMAPPING  02/05/2021   IR US GUIDE VASC ACCESS RIGHT  02/05/2021   NASAL HEMORRHAGE CONTROL     "for bleeding"    SAVORY DILATION  02/26/2011   Procedure: SAVORY DILATION;  Surgeon: [name], MD;  Location: [name];  Service: Endoscopy;  Laterality: N/A;  c-arm needed   SCLEROTHERAPY  12/24/2020   Procedure: SCLEROTHERAPY;  Surgeon: Arta Silence, MD;  Location: [facility] ENDOSCOPY;  Service: Endoscopy;;   SCLEROTHERAPY  01/19/2021   Procedure: Clide Deutscher;  Surgeon: [name], MD;  Location: [facility] ENDOSCOPY;  Service: Endoscopy;;   SUBMUCOSAL INJECTION  12/04/2017   Procedure: SUBMUCOSAL INJECTION;  Surgeon: [name], MD;  Location: [facility] ENDOSCOPY;  Service: Endoscopy;;    I have reviewed the social history and family history with the patient and they are unchanged from previous note.  ALLERGIES:  is allergic to aspirin.  MEDICATIONS:  Current Outpatient Medications  Medication Sig Dispense Refill   acetaminophen (TYLENOL) 325 MG tablet Take  650 mg by mouth every 4 (four) hours as needed for mild pain.     amLODipine (NORVASC) 5 MG tablet Take 1 tablet (5 mg total) by mouth daily. 90 tablet 3   ascorbic acid (VITAMIN C) 250 MG tablet Take 250 mg by mouth 2 (two) times daily.     bisacodyl (DULCOLAX) 10 MG suppository Place 10 mg rectally daily as needed (constipation).     ferrous sulfate 325 (65 FE) MG tablet Take 325 mg by mouth 2 (two) times daily.     gabapentin  (NEURONTIN) 100 MG capsule Take 1 capsule (100 mg total) by mouth in the morning AND 2 capsules (200 mg total) at bedtime. (Patient taking differently: 100 mg in the morning, 200 mg at bedtime.)     guaiFENesin (ROBITUSSIN) 100 MG/5ML liquid Take 200 mg by mouth every 4 (four) hours as needed for cough.     insulin lispro (HUMALOG KWIKPEN) 100 UNIT/ML KwikPen Inject 3 Units into the skin 3 (three) times daily. (Patient taking differently: Inject 4-6 Units into the skin See admin instructions. 3 entries on paperwork from facility: 4 units in the morning for hyperglycemia, hold for BS < 120. 5 units once daily, hold for BS < 120. 6 units once daily, hold for BS < 120.)     lactulose (CHRONULAC) 10 GM/15ML solution Take 10 g by mouth 3 (three) times daily.     LANTUS SOLOSTAR 100 UNIT/ML Solostar Pen Inject 10 Units into the skin at bedtime. 15 mL 1   levETIRAcetam (KEPPRA) 500 MG tablet Take 1 tablet (500 mg total) by mouth 2 (two) times daily.     Magnesium Hydroxide (MILK OF MAGNESIA PO) Take 30 mLs by mouth daily.     memantine (NAMENDA) 10 MG tablet TAKE 1 TABLET BY MOUTH TWICE DAILY (Patient taking differently: Take 10 mg by mouth 2 (two) times daily.) 60 tablet 10   mirtazapine (REMERON) 7.5 MG tablet Take 7.5 mg by mouth at bedtime.     Multiple Vitamin (MULTIVITAMIN) tablet Take 1 tablet by mouth daily.     omeprazole (PRILOSEC) 40 MG capsule Take 40 mg by mouth 2 (two) times daily.     oxymetazoline (AFRIN) 0.05 % nasal spray Place 2 sprays into both nostrils every 6 (six) hours as needed (nose bleeds).     PROTEIN PO Take 30 mLs by mouth daily.     torsemide (DEMADEX) 20 MG tablet Take 1 tablet (20 mg total) by mouth as needed (please take 1 20mg  tablet as needed for weight gain of 2 pounds overnight or 5 pounds in a week). (Patient taking differently: Take 20 mg by mouth daily as needed (weight gain of 2 lbs overnight or 5 lbs in 1 week.).) 180 tablet 3   triamcinolone ointment (KENALOG)  0.5 % Apply 1 Application topically 2 (two) times daily.     TRULICITY 3 0000000 SOPN Inject 3 mg into the skin once a week. (Patient taking differently: Inject 3 mg into the skin every Thursday.) 2 mL 2   No current facility-administered medications for this visit.   Facility-Administered Medications Ordered in Other Visits  Medication Dose Route Frequency Provider Last Rate Last Admin   sodium chloride 0.9 % injection 10 mL  10 mL Intracatheter PRN Truitt Merle, MD       sodium chloride flush (NS) 0.9 % injection 10 mL  10 mL Intracatheter PRN Truitt Merle, MD   10 mL at 08/18/19 1717    PHYSICAL EXAMINATION: ECOG  PERFORMANCE STATUS: {CHL ONC ECOG [phone]}  There were no vitals filed for this visit. Wt Readings from Last 3 Encounters:  03/16/22 128 lb (58.1 kg)  12/19/21 128 lb 8.5 oz (58.3 kg)  11/22/21 128 lb 8.5 oz (58.3 kg)    {Only keep what was examined. If exam not performed, can use .CEXAM } GENERAL:alert, no distress and comfortable SKIN: skin color, texture, turgor are normal, no rashes or significant lesions EYES: normal, Conjunctiva are pink and non-injected, sclera clear {OROPHARYNX:no exudate, no erythema and lips, buccal mucosa, and tongue normal}  NECK: supple, thyroid normal size, non-tender, without nodularity LYMPH:  no palpable lymphadenopathy in the cervical, axillary {or inguinal} LUNGS: clear to auscultation and percussion with normal breathing effort HEART: regular rate & rhythm and no murmurs and no lower extremity edema ABDOMEN:abdomen soft, non-tender and normal bowel sounds Musculoskeletal:no cyanosis of digits and no clubbing  NEURO: alert & oriented x 3 with fluent speech, no focal motor/sensory deficits  LABORATORY DATA:  I have reviewed the data as listed    Latest Ref Rng & Units 03/16/2022    7:22 PM 01/28/2022   10:25 AM 01/16/2022    1:52 PM  CBC  WBC 4.0 - 10.5 K/uL 6.0  3.3  4.9   Hemoglobin 12.0 - 15.0 g/dL 11.4  9.7  9.6   Hematocrit  36.0 - 46.0 % 35.8  34.0  32.2   Platelets 150 - 400 K/uL 151  142  189         Latest Ref Rng & Units 03/16/2022    7:22 PM 01/28/2022    9:38 AM 12/19/2021    4:50 PM  CMP  Glucose 70 - 99 mg/dL 80  122  121   BUN 8 - 23 mg/dL 16  24  15    Creatinine 0.44 - 1.00 mg/dL 0.95  1.28  1.27   Sodium 135 - 145 mmol/L 144  147  144   Potassium 3.5 - 5.1 mmol/L 3.9  4.0  4.3   Chloride 98 - 111 mmol/L 111  115  112   CO2 22 - 32 mmol/L 24  22  25    Calcium 8.9 - 10.3 mg/dL 8.7  8.9  9.0   Total Protein 6.5 - 8.1 g/dL 4.8  5.2  5.4   Total Bilirubin 0.3 - 1.2 mg/dL 0.3  0.8  0.6   Alkaline Phos 38 - 126 U/L 175  111  98   AST 15 - 41 U/L 44  51  21   ALT 0 - 44 U/L 21  18  11        RADIOGRAPHIC STUDIES: I have personally reviewed the radiological images as listed and agreed with the findings in the report. No results found.    No orders of the defined types were placed in this encounter.  All questions were answered. The patient knows to call the clinic with any problems, questions or concerns. No barriers to learning was detected. The total time spent in the appointment was {CHL ONC TIME VISIT - [id number].     Baldemar Friday, CMA 05/01/2022   I, Audry Riles, CMA, am acting as scribe for Truitt Merle, MD.   {Add scribe attestation statement}

## 2022-05-01 NOTE — Assessment & Plan Note (Deleted)
with epistaxis and frequent GI bleeding -She receives blood transfusion as needed, but has not needed since 04/2018 when she started Beva. -She is on Avastin intermittently/PRN due to high proteinuria. -she last required blood transfusion on 04/17/21. -she received beva and IV Feraheme on 07/01/21 and 07/30/21. She responded well  -Lab reviewed, hgb 9.5. Plan to proceed with beva and iv feraheme as scheduled. -we will continue to follow her labs monthly through her facility, [facility], and I will see her back in 6 months.

## 2022-05-01 NOTE — Progress Notes (Signed)
[name]: [phone]  Fax: [phone]  PATIENT NAME: [name] [id number] [phone] (home)  DOB: 1940-07-12 MRN: [id number]  PRIMARY CARE PROVIDER:    [name], MD,  [place] [address] [phone]  REFERRING PROVIDER:  Lavone Neri, NP   RESPONSIBLE PARTY:   Spouse [phone] c Contact: Tabitha  God-daughter [phone], [place]     Name Relation Home Work Mobile   Mebanes,Denise Niece   [phone]   Khamyah, Stohr [phone]  [phone]      I met face to face with patient and at facility. Palliative Care was asked to follow this patient by consultation request of  Danie Binder NP to address advance care planning, complex medical decision making and goals of care clarification.  NP called Tabitha and left her a voicemail with callback number.  Spouse continues to visit every other day. Strong family support system.  Visit consisted of counseling and education dealing with the complex and emotionally intense issues of symptom management and palliative care in the setting of serious and potentially life-threatening illness. Palliative care team will continue to support patient, patient's family, and medical team.    ASSESSMENT AND / RECOMMENDATIONS:   CODE STATUS: Patient is a Full code  Goals of Care: Goals include to maximize quality of life and symptom management  Symptom Management/Plan: Dementia: Continue memantine as ordered.  Memory loss/confusion at baseline.  Encourage reminiscence, word search/puzzles, cueing for recollection.  Promote calm approach and engaging environment.  Safety and fall precautions.  incontinent of bowel and bladder FAST 6D.   Type 2 DM: Stable/controlled A1c 6.09 Jan 2022. Saw Endocrinologist 01/27/22. Continue Trulicity (Dulaglutide), Insulin as ordered. No  concentrated sweets. Diabetic diet, no empty caloric beverages. Check CBG as ordered to help ensure tight control.  Repeat A1c in 3 to 6 months.  CFH: Stable, no edema. Continue Torsemide as ordered. Elevate BLE during the day as much as possible to promote circulation. Adhere to Fluid and salt limits. Monitor for shortness of breath, edema, chest pain. Weight per facility protocol.  Reports weight gain of 2 pounds in a day or 5 pounds in a week.  Follow up: Palliative care will continue to follow for complex medical decision making, advance care planning, and clarification of goals. Return 6 weeks or prn. Encouraged to call provider sooner with any concerns.   Family /Caregiver/Community Supports: Patient is in SNF for ongoing care  HOSPICE ELIGIBILITY/DIAGNOSIS: TBD  Chief Complaint: Follow up visit  HISTORY OF PRESENT ILLNESS:  [name] is a 82 y.o. year old female  with multiple morbidities requiring close monitoring/management with high risk of complications and morbidity: Advanced Dementia,Type 2 DM. CHF, Anemia.  Patient denies pain/discomfort; states she would like to go back home, said she is thankful her husband comes to visit often.  She did not have any other complaint, denied pain/discomfort.  FLACC 0.  History obtained from review of EMR, discussion with primary team, caregiver, family and/or [name].   Review and summarization of Epic records shows history from other than patient. Rest of 10 point ROS asked and negative.  I reviewed as needed, available labs, patient records, imaging, studies and related documents from the EMR.   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   Fatigue 07/10/2011   Type 2 diabetes mellitus ([place]) 09/15/2012   Chronic diastolic CHF (congestive heart failure) ([place]) 10/03/2013   Essential hypertension 03/02/2012   CKD  (chronic kidney disease), stage III ([name]    Simple febrile convulsions ([place]) 10/21/2014   Dementia ([place]) 01/30/2015   Healthcare maintenance 06/20/2015   Seborrheic keratosis 06/20/2015   PAF (paroxysmal atrial fibrillation) ([place]) 09/29/2015   Pain in joint of right shoulder 10/22/2015   Peripheral vascular disease ([place]) 12/30/2015   Dry skin 02/17/2016   Venous insufficiency of both lower extremities 04/27/2016   Epistaxis 07/30/2016   Osteopenia after menopause 01/13/2017   Overgrown toenails 03/18/2017   Pulmonary HTN ([place]) 04/01/2017   Heart murmur 04/01/2017   Lower extremity pain, bilateral 07/15/2017   Pain localized to upper abdomen 10/05/2017   Leg weakness, bilateral 12/23/2017   Hyperpigmented skin lesion 02/07/2018   Iron deficiency anemia due to chronic blood loss 04/19/2018   Port-A-Cath in place 05/23/2018   Neuropathy 07/27/2019   Seizures ([place]) 09/2014   Hearing loss 05/29/2020   Bullous lesion 10/08/2020   History of GI bleed 01/18/2021   Resolved Ambulatory Problems    Diagnosis Date Noted   Type 2 diabetes mellitus with complication, with long-term current use of insulin ([place]) 04/08/2006   HYPERLIPIDEMIA 04/08/2006   Anemia, iron deficiency 04/21/2006   Iron deficiency anemia due to chronic blood loss 04/08/2006   HYPERTENSION, BENIGN SYSTEMIC 04/08/2006   SYMPTOM, EPISTAXIS 05/21/2006   Anemia 02/01/2011   GI bleed 02/01/2011   Hypokalemia 02/01/2011   Obesity 09/15/2012   Abnormal CXR 09/15/2012   Dyspnea 10/03/2013   Acute on chronic heart failure with preserved ejection fraction (HFpEF) ([place]) 10/03/2013   Upper GI bleed 07/05/2014   Poor venous access    SOB (shortness of breath) 07/27/2014   Acute kidney injury ([place]) 08/27/2014   Acute encephalopathy 08/27/2014   Acute renal failure superimposed on stage 3 chronic kidney disease ([place]) 08/27/2014   Anemia of chronic disease 08/27/2014   UTI (lower urinary tract infection) 08/27/2014   Anemia  09/04/2014   Acute lower GI bleeding 09/04/2014   Acute blood loss anemia    Hyperammonemia ([name] 10/03/2014   Status epilepticus ([place]) 10/03/2014   Hypotension 11/13/2014   Symptomatic anemia 11/29/2014   Bleeding gastrointestinal    SOB (shortness of breath) 0000000   Lichen planus 0000000   Acute GI bleeding 09/28/2015   Acute on chronic blood loss anemia    Angiodysplasia of stomach with hemorrhage    AVM (arteriovenous malformation) of stomach, acquired with hemorrhage    Hyperglycemia due to type 2 diabetes mellitus ([place]) 05/21/2016   Muscle spasm of both lower legs 06/15/2016   Hematemesis 12/04/2017   Acute right hip pain 03/10/2018   Hyperkalemia 10/18/2018   Hypervolemia due to congestive heart failure ([place]) 08/02/2019   Abnormal LFTs 08/09/2019   Impacted cerumen of left ear 07/15/2020   Hypoglycemia 09/29/2020   Acute upper GI bleed 12/23/2020   Hemorrhagic shock ([place])    Altered mental status [id number]   Acute metabolic encephalopathy [id number]   AKI (acute kidney injury) ([place])    Dehydration    AKI (acute kidney injury) ([place]) 01/28/2021   Altered mental status 02/03/2021   AMS (altered mental status) 02/04/2021   Acute upper GI bleed 11/21/2021   Past Medical History:  Diagnosis Date   Chronic anemia    Chronic GI bleeding    Family history of anesthesia complication  Frequent nosebleeds    Gastric AV malformation    GERD (gastroesophageal reflux disease)    History of blood transfusion "several"   History of epistaxis    HTN (hypertension), benign 03/02/2012   Hyperlipidemia    Iron deficiency anemia    Osler-Weber-Rendu syndrome ([name]    Pneumonia 1990's X 2   Telangiectasia    Type II diabetes mellitus ([place])     SOCIAL HX:  Social History   Tobacco Use   Smoking status: Former    Packs/day: 1.00    Years: 20.00    Additional pack years: 0.00    Total pack years: 20.00    Types: Cigarettes    Quit date: 02/10/1971    Years since  quitting: 51.2   Smokeless tobacco: Never   Tobacco comments:    09/15/2012 "smoked 50-60 yr ago"  Substance Use Topics   Alcohol use: No    Alcohol/week: 0.0 standard drinks of alcohol     FAMILY HX:  Family History  Problem Relation Age of Onset   Healthy Mother    Stroke Father    Diabetes Niece    Breast cancer Other    Malignant hyperthermia Neg Hx    Seizures Neg Hx    Lung cancer Neg Hx       ALLERGIES:  Allergies  Allergen Reactions   Aspirin Nausea And Vomiting      PERTINENT MEDICATIONS:  Outpatient Encounter Medications as of 05/01/2022  Medication Sig   acetaminophen (TYLENOL) 325 MG tablet Take 650 mg by mouth every 4 (four) hours as needed for mild pain.   amLODipine (NORVASC) 5 MG tablet Take 1 tablet (5 mg total) by mouth daily.   ascorbic acid (VITAMIN C) 250 MG tablet Take 250 mg by mouth 2 (two) times daily.   bisacodyl (DULCOLAX) 10 MG suppository Place 10 mg rectally daily as needed (constipation).   ferrous sulfate 325 (65 FE) MG tablet Take 325 mg by mouth 2 (two) times daily.   gabapentin (NEURONTIN) 100 MG capsule Take 1 capsule (100 mg total) by mouth in the morning AND 2 capsules (200 mg total) at bedtime. (Patient taking differently: 100 mg in the morning, 200 mg at bedtime.)   guaiFENesin (ROBITUSSIN) 100 MG/5ML liquid Take 200 mg by mouth every 4 (four) hours as needed for cough.   insulin lispro (HUMALOG KWIKPEN) 100 UNIT/ML KwikPen Inject 3 Units into the skin 3 (three) times daily. (Patient taking differently: Inject 4-6 Units into the skin See admin instructions. 3 entries on paperwork from facility: 4 units in the morning for hyperglycemia, hold for BS < 120. 5 units once daily, hold for BS < 120. 6 units once daily, hold for BS < 120.)   lactulose (CHRONULAC) 10 GM/15ML solution Take 10 g by mouth 3 (three) times daily.   LANTUS SOLOSTAR 100 UNIT/ML Solostar Pen Inject 10 Units into the skin at bedtime.   levETIRAcetam (KEPPRA) 500 MG tablet  Take 1 tablet (500 mg total) by mouth 2 (two) times daily.   Magnesium Hydroxide (MILK OF MAGNESIA PO) Take 30 mLs by mouth daily.   memantine (NAMENDA) 10 MG tablet TAKE 1 TABLET BY MOUTH TWICE DAILY (Patient taking differently: Take 10 mg by mouth 2 (two) times daily.)   mirtazapine (REMERON) 7.5 MG tablet Take 7.5 mg by mouth at bedtime.   Multiple Vitamin (MULTIVITAMIN) tablet Take 1 tablet by mouth daily.   omeprazole (PRILOSEC) 40 MG capsule Take 40 mg by mouth 2 (two) times  daily.   oxymetazoline (AFRIN) 0.05 % nasal spray Place 2 sprays into both nostrils every 6 (six) hours as needed (nose bleeds).   PROTEIN PO Take 30 mLs by mouth daily.   torsemide (DEMADEX) 20 MG tablet Take 1 tablet (20 mg total) by mouth as needed (please take 1 20mg  tablet as needed for weight gain of 2 pounds overnight or 5 pounds in a week). (Patient taking differently: Take 20 mg by mouth daily as needed (weight gain of 2 lbs overnight or 5 lbs in 1 week.).)   triamcinolone ointment (KENALOG) 0.5 % Apply 1 Application topically 2 (two) times daily.   TRULICITY 3 0000000 SOPN Inject 3 mg into the skin once a week. (Patient taking differently: Inject 3 mg into the skin every Thursday.)   Facility-Administered Encounter Medications as of 05/01/2022  Medication   sodium chloride 0.9 % injection 10 mL   sodium chloride flush (NS) 0.9 % injection 10 mL   I spent 40 minutes providing this consultation; time includes spent with patient/family, chart review and documentation. More than 50% of the time in this consultation was spent on care coordination.  Thank you for the opportunity to participate in the care of [name].  The palliative care team will continue to follow. Please call our office at [phone] if we can be of additional assistance.   Note: Portions of this note were generated with [employer]. Dictation errors may occur despite best attempts at proofreading.  Teodoro Spray, NP

## 2022-05-04 ENCOUNTER — Other Ambulatory Visit: Payer: Medicare Other

## 2022-05-04 ENCOUNTER — Ambulatory Visit: Payer: Medicare Other | Admitting: Hematology

## 2022-05-19 ENCOUNTER — Inpatient Hospital Stay: Payer: Medicare Other

## 2022-05-19 ENCOUNTER — Inpatient Hospital Stay: Payer: Medicare Other | Attending: Hematology | Admitting: Hematology

## 2022-05-19 DIAGNOSIS — R911 Solitary pulmonary nodule: Secondary | ICD-10-CM | POA: Insufficient documentation

## 2022-05-19 DIAGNOSIS — D508 Other iron deficiency anemias: Secondary | ICD-10-CM | POA: Insufficient documentation

## 2022-05-19 DIAGNOSIS — F0390 Unspecified dementia without behavioral disturbance: Secondary | ICD-10-CM | POA: Insufficient documentation

## 2022-05-19 DIAGNOSIS — I780 Hereditary hemorrhagic telangiectasia: Secondary | ICD-10-CM | POA: Insufficient documentation

## 2022-05-19 NOTE — Assessment & Plan Note (Deleted)
with epistaxis and frequent GI bleeding -She receives blood transfusion as needed, but has not needed since 04/2018 when she started Beva. -She is on Avastin intermittently/PRN due to high proteinuria. -she last required blood transfusion on 04/17/21. -she received beva and IV Feraheme on 07/01/21 and 07/30/21. She responded well  -Lab reviewed, hgb 9.5. Plan to proceed with beva and iv feraheme as scheduled. -we will continue to follow her labs monthly through her facility, [facility], and I will see her back in 6 months. 

## 2022-05-19 NOTE — Progress Notes (Deleted)
[facility]   [phone] Fax:([phone]   Clinic Follow up Note   Patient Care Team: Adron Bene, MD as PCP - General Quintella Reichert, MD as PCP - Cardiology (Cardiology) Malachy Mood, MD as Consulting Physician (Hematology) Reather Littler, MD as Consulting Physician (Endocrinology)  Date of Service:  05/19/2022  CHIEF COMPLAINT: f/u of  HHT, anemia   CURRENT THERAPY:   1. Iv feraheme as needed if ferritin<30, latest on 07/30/21 2. Avastin 5mg /kg, as needed for anemia, starting 05/09/18.              -given intermittently due to protenuria 3. Blood transfusion if Hg<8.0  ASSESSMENT: *** [name] is a 82 y.o. female with   No problem-specific Assessment & Plan notes found for this encounter.  ***   PLAN:      SUMMARY OF ONCOLOGIC HISTORY: Oncology History   No history exists.     INTERVAL HISTORY: *** [name] is here for a follow up of   HHT, anemia .She was last seen by me on 11/06/2021.She presents to the clinic       All other systems were reviewed with the patient and are negative.  MEDICAL HISTORY:  Past Medical History:  Diagnosis Date   Chronic anemia    Chronic diastolic CHF (congestive heart failure) ([name] 10/03/2013   Chronic GI bleeding    /notes 11/29/2014   Family history of anesthesia complication    "niece has a hard time coming out" (09/15/2012)   Frequent nosebleeds    chronic   Gastric AV malformation    /notes 11/29/2014   GERD (gastroesophageal reflux disease)    Heart murmur 04/01/2017   Moderate AVSC on echo 09/2016   Hematemesis 12/04/2017   History of blood transfusion "several"   History of epistaxis    HTN (hypertension), benign 03/02/2012   Hyperlipidemia    Iron deficiency anemia    chronic infusions"   Lichen planus    Both lower extremities   Osler-Weber-Rendu syndrome ([name]    Hattie Perch 11/29/2014   Overgrown toenails 03/18/2017   Pneumonia 1990's X 2   Pulmonary HTN ([name]  04/01/2017   PASP on echo 09/2016 and by echo 2019   Seizures ([name] 09/2014   Symptomatic anemia 11/29/2014   Telangiectasia    Gastric    Type II diabetes mellitus (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    SURGICAL HISTORY: Past Surgical History:  Procedure Laterality Date   CATARACT EXTRACTION     "I think it was just one eye"   ESOPHAGOGASTRODUODENOSCOPY  02/26/2011   Procedure: ESOPHAGOGASTRODUODENOSCOPY (EGD);  Surgeon: Barrie Folk, MD;  Location: [facility] ENDOSCOPY;  Service: Endoscopy;  Laterality: N/A;   ESOPHAGOGASTRODUODENOSCOPY N/A 11/08/2012   Procedure: ESOPHAGOGASTRODUODENOSCOPY (EGD);  Surgeon: Theda Belfast, MD;  Location: [facility] ENDOSCOPY;  Service: Endoscopy;  Laterality: N/A;   ESOPHAGOGASTRODUODENOSCOPY N/A 10/04/2013   Procedure: ESOPHAGOGASTRODUODENOSCOPY (EGD);  Surgeon: Vertell Novak., MD;  Location: [facility] ENDOSCOPY;  Service: Endoscopy;  Laterality: N/A;  with APC on stand-by   ESOPHAGOGASTRODUODENOSCOPY N/A 07/06/2014   Procedure: ESOPHAGOGASTRODUODENOSCOPY (EGD);  Surgeon: Vida Rigger, MD;  Location: [facility]OSCOPY;  Service: Endoscopy;  Laterality: N/A;   ESOPHAGOGASTRODUODENOSCOPY N/A 09/05/2014   Procedure: ESOPHAGOGASTRODUODENOSCOPY (EGD);  Surgeon: Carman Ching, MD;  Location: [facility]OSCOPY;  Service: Endoscopy;  Laterality: N/A;  APC on standby to control bleeding   ESOPHAGOGASTRODUODENOSCOPY N/A 11/29/2014   Procedure: ESOPHAGOGASTRODUODENOSCOPY (EGD);  Surgeon: Charlott Rakes, MD;  Location:  [facility] ENDOSCOPY;  Service: Endoscopy;  Laterality: N/A;   ESOPHAGOGASTRODUODENOSCOPY N/A 09/28/2015   Procedure: ESOPHAGOGASTRODUODENOSCOPY (EGD);  Surgeon: Vida Rigger, MD;  Location: [facility] ENDOSCOPY;  Service: Endoscopy;  Laterality: N/A;   ESOPHAGOGASTRODUODENOSCOPY N/A 11/22/2021   Procedure: ESOPHAGOGASTRODUODENOSCOPY (EGD);  Surgeon: Lynann Bologna, DO;  Location: [facility]OSCOPY;  Service: Gastroenterology;  Laterality: N/A;   ESOPHAGOGASTRODUODENOSCOPY (EGD) WITH PROPOFOL  N/A 12/04/2017   Procedure: ESOPHAGOGASTRODUODENOSCOPY (EGD) WITH PROPOFOL;  Surgeon: Charlott Rakes, MD;  Location: [facility] ENDOSCOPY;  Service: Endoscopy;  Laterality: N/A;   ESOPHAGOGASTRODUODENOSCOPY (EGD) WITH PROPOFOL N/A 12/24/2020   Procedure: ESOPHAGOGASTRODUODENOSCOPY (EGD) WITH PROPOFOL;  Surgeon: Willis Modena, MD;  Location: [facility] ENDOSCOPY;  Service: Endoscopy;  Laterality: N/A;   ESOPHAGOGASTRODUODENOSCOPY (EGD) WITH PROPOFOL N/A 01/19/2021   Procedure: ESOPHAGOGASTRODUODENOSCOPY (EGD) WITH PROPOFOL;  Surgeon: Charlott Rakes, MD;  Location: [facility] ENDOSCOPY;  Service: Endoscopy;  Laterality: N/A;   ESOPHAGOGASTRODUODENOSCOPY (EGD) WITH PROPOFOL N/A 02/06/2021   Procedure: ESOPHAGOGASTRODUODENOSCOPY (EGD) WITH PROPOFOL;  Surgeon: Kerin Salen, MD;  Location: [facility] ENDOSCOPY;  Service: Gastroenterology;  Laterality: N/A;   ESOPHAGOGASTRODUODENOSCOPY ENDOSCOPY  08/19/2006   with laser treatment   HEMOSTASIS CLIP PLACEMENT  12/24/2020   Procedure: HEMOSTASIS CLIP PLACEMENT;  Surgeon: Willis Modena, MD;  Location: [facility] ENDOSCOPY;  Service: Endoscopy;;   HEMOSTASIS CLIP PLACEMENT  01/19/2021   Procedure: HEMOSTASIS CLIP PLACEMENT;  Surgeon: Charlott Rakes, MD;  Location: [facility] ENDOSCOPY;  Service: Endoscopy;;   HEMOSTASIS CLIP PLACEMENT  11/22/2021   Procedure: HEMOSTASIS CLIP PLACEMENT;  Surgeon: Lynann Bologna, DO;  Location: [name];  Service: Gastroenterology;;   HOT HEMOSTASIS N/A 07/06/2014   Procedure: HOT HEMOSTASIS (ARGON PLASMA COAGULATION/BICAP);  Surgeon: Vida Rigger, MD;  Location: [facility] ENDOSCOPY;  Service: Endoscopy;  Laterality: N/A;   HOT HEMOSTASIS N/A 09/28/2015   Procedure: HOT HEMOSTASIS (ARGON PLASMA COAGULATION/BICAP);  Surgeon: Vida Rigger, MD;  Location: [facility] ENDOSCOPY;  Service: Endoscopy;  Laterality: N/A;   HOT HEMOSTASIS N/A 12/04/2017   Procedure: HOT HEMOSTASIS (ARGON PLASMA COAGULATION/BICAP);  Surgeon: Charlott Rakes, MD;  Location: [facility] ENDOSCOPY;  Service:  Endoscopy;  Laterality: N/A;   HOT HEMOSTASIS  11/22/2021   Procedure: HOT HEMOSTASIS (ARGON PLASMA COAGULATION/BICAP);  Surgeon: Lynann Bologna, DO;  Location: [facility] ENDOSCOPY;  Service: Gastroenterology;;   [address] SELECTIVE EACH ADDITIONAL VESSEL  02/05/2021   IR ANGIOGRAM VISCERAL SELECTIVE  02/05/2021   IR EMBO ART  VEN HEMORR LYMPH EXTRAV  INC GUIDE ROADMAPPING  02/05/2021   IR US GUIDE VASC ACCESS RIGHT  02/05/2021   NASAL HEMORRHAGE CONTROL     "for bleeding"    SAVORY DILATION  02/26/2011   Procedure: SAVORY DILATION;  Surgeon: Barrie Folk, MD;  Location: [name];  Service: Endoscopy;  Laterality: N/A;  c-arm needed   SCLEROTHERAPY  12/24/2020   Procedure: SCLEROTHERAPY;  Surgeon: Willis Modena, MD;  Location: [facility] ENDOSCOPY;  Service: Endoscopy;;   SCLEROTHERAPY  01/19/2021   Procedure: Susa Day;  Surgeon: Charlott Rakes, MD;  Location: [facility] ENDOSCOPY;  Service: Endoscopy;;   SUBMUCOSAL INJECTION  12/04/2017   Procedure: SUBMUCOSAL INJECTION;  Surgeon: Charlott Rakes, MD;  Location: [facility] ENDOSCOPY;  Service: Endoscopy;;    I have reviewed the social history and family history with the patient and they are unchanged from previous note.  ALLERGIES:  is allergic to aspirin.  MEDICATIONS:  Current Outpatient Medications  Medication Sig Dispense Refill   acetaminophen (TYLENOL) 325 MG tablet Take 650 mg by mouth every 4 (four) hours as needed for mild pain.     amLODipine (NORVASC)  5 MG tablet Take 1 tablet (5 mg total) by mouth daily. 90 tablet 3   ascorbic acid (VITAMIN C) 250 MG tablet Take 250 mg by mouth 2 (two) times daily.     bisacodyl (DULCOLAX) 10 MG suppository Place 10 mg rectally daily as needed (constipation).     ferrous sulfate 325 (65 FE) MG tablet Take 325 mg by mouth 2 (two) times daily.     gabapentin (NEURONTIN) 100 MG capsule Take 1 capsule (100 mg total) by mouth in the morning AND 2 capsules (200 mg total) at bedtime. (Patient taking  differently: 100 mg in the morning, 200 mg at bedtime.)     guaiFENesin (ROBITUSSIN) 100 MG/5ML liquid Take 200 mg by mouth every 4 (four) hours as needed for cough.     insulin lispro (HUMALOG KWIKPEN) 100 UNIT/ML KwikPen Inject 3 Units into the skin 3 (three) times daily. (Patient taking differently: Inject 4-6 Units into the skin See admin instructions. 3 entries on paperwork from facility: 4 units in the morning for hyperglycemia, hold for BS < 120. 5 units once daily, hold for BS < 120. 6 units once daily, hold for BS < 120.)     lactulose (CHRONULAC) 10 GM/15ML solution Take 10 g by mouth 3 (three) times daily.     LANTUS SOLOSTAR 100 UNIT/ML Solostar Pen Inject 10 Units into the skin at bedtime. 15 mL 1   levETIRAcetam (KEPPRA) 500 MG tablet Take 1 tablet (500 mg total) by mouth 2 (two) times daily.     Magnesium Hydroxide (MILK OF MAGNESIA PO) Take 30 mLs by mouth daily.     memantine (NAMENDA) 10 MG tablet TAKE 1 TABLET BY MOUTH TWICE DAILY (Patient taking differently: Take 10 mg by mouth 2 (two) times daily.) 60 tablet 10   mirtazapine (REMERON) 7.5 MG tablet Take 7.5 mg by mouth at bedtime.     Multiple Vitamin (MULTIVITAMIN) tablet Take 1 tablet by mouth daily.     omeprazole (PRILOSEC) 40 MG capsule Take 40 mg by mouth 2 (two) times daily.     oxymetazoline (AFRIN) 0.05 % nasal spray Place 2 sprays into both nostrils every 6 (six) hours as needed (nose bleeds).     PROTEIN PO Take 30 mLs by mouth daily.     torsemide (DEMADEX) 20 MG tablet Take 1 tablet (20 mg total) by mouth as needed (please take 1 20mg  tablet as needed for weight gain of 2 pounds overnight or 5 pounds in a week). (Patient taking differently: Take 20 mg by mouth daily as needed (weight gain of 2 lbs overnight or 5 lbs in 1 week.).) 180 tablet 3   triamcinolone ointment (KENALOG) 0.5 % Apply 1 Application topically 2 (two) times daily.     TRULICITY 3 MG/0.5ML SOPN Inject 3 mg into the skin once a week. (Patient  taking differently: Inject 3 mg into the skin every Thursday.) 2 mL 2   No current facility-administered medications for this visit.   Facility-Administered Medications Ordered in Other Visits  Medication Dose Route Frequency Provider Last Rate Last Admin   sodium chloride 0.9 % injection 10 mL  10 mL Intracatheter PRN Malachy Mood, MD       sodium chloride flush (NS) 0.9 % injection 10 mL  10 mL Intracatheter PRN Malachy Mood, MD   10 mL at 08/18/19 1717    PHYSICAL EXAMINATION: ECOG PERFORMANCE STATUS: {CHL ONC ECOG [phone]}  There were no vitals filed for this visit. Wt Readings from Last  3 Encounters:  03/16/22 128 lb (58.1 kg)  12/19/21 128 lb 8.5 oz (58.3 kg)  11/22/21 128 lb 8.5 oz (58.3 kg)    {Only keep what was examined. If exam not performed, can use .CEXAM } GENERAL:alert, no distress and comfortable SKIN: skin color, texture, turgor are normal, no rashes or significant lesions EYES: normal, Conjunctiva are pink and non-injected, sclera clear {OROPHARYNX:no exudate, no erythema and lips, buccal mucosa, and tongue normal}  NECK: supple, thyroid normal size, non-tender, without nodularity LYMPH:  no palpable lymphadenopathy in the cervical, axillary {or inguinal} LUNGS: clear to auscultation and percussion with normal breathing effort HEART: regular rate & rhythm and no murmurs and no lower extremity edema ABDOMEN:abdomen soft, non-tender and normal bowel sounds Musculoskeletal:no cyanosis of digits and no clubbing  NEURO: alert & oriented x 3 with fluent speech, no focal motor/sensory deficits  LABORATORY DATA:  I have reviewed the data as listed    Latest Ref Rng & Units 03/16/2022    7:22 PM 01/28/2022   10:25 AM 01/16/2022    1:52 PM  CBC  WBC 4.0 - 10.5 K/uL 6.0  3.3  4.9   Hemoglobin 12.0 - 15.0 g/dL 16.111.4  9.7  9.6   Hematocrit 36.0 - 46.0 % 35.8  34.0  32.2   Platelets 150 - 400 K/uL 151  142  189         Latest Ref Rng & Units 03/16/2022    7:22 PM  01/28/2022    9:38 AM 12/19/2021    4:50 PM  CMP  Glucose 70 - 99 mg/dL 80  096122  045121   BUN 8 - 23 mg/dL 16  24  15    Creatinine 0.44 - 1.00 mg/dL 4.090.95  8.111.28  9.141.27   Sodium 135 - 145 mmol/L 144  147  144   Potassium 3.5 - 5.1 mmol/L 3.9  4.0  4.3   Chloride 98 - 111 mmol/L 111  115  112   CO2 22 - 32 mmol/L 24  22  25    Calcium 8.9 - 10.3 mg/dL 8.7  8.9  9.0   Total Protein 6.5 - 8.1 g/dL 4.8  5.2  5.4   Total Bilirubin 0.3 - 1.2 mg/dL 0.3  0.8  0.6   Alkaline Phos 38 - 126 U/L 175  111  98   AST 15 - 41 U/L 44  51  21   ALT 0 - 44 U/L 21  18  11        RADIOGRAPHIC STUDIES: I have personally reviewed the radiological images as listed and agreed with the findings in the report. No results found.    No orders of the defined types were placed in this encounter.  All questions were answered. The patient knows to call the clinic with any problems, questions or concerns. No barriers to learning was detected. The total time spent in the appointment was {CHL ONC TIME VISIT - NWGNF:[id number]}SWIFT:[phone]}.     Salome HolmesLaChelle N Demitrios Molyneux, CMA 05/19/2022   I, Monica MartinezLaChelle Mico Spark, CMA, am acting as scribe for Malachy MoodYan Feng, MD.   {Add scribe attestation statement}

## 2022-05-26 ENCOUNTER — Telehealth: Payer: Self-pay | Admitting: Hematology

## 2022-06-01 ENCOUNTER — Non-Acute Institutional Stay: Payer: Medicare Other | Admitting: Hospice

## 2022-06-01 DIAGNOSIS — Z515 Encounter for palliative care: Secondary | ICD-10-CM

## 2022-06-01 DIAGNOSIS — F0390 Unspecified dementia without behavioral disturbance: Secondary | ICD-10-CM

## 2022-06-01 DIAGNOSIS — I5032 Chronic diastolic (congestive) heart failure: Secondary | ICD-10-CM

## 2022-06-01 DIAGNOSIS — E119 Type 2 diabetes mellitus without complications: Secondary | ICD-10-CM

## 2022-06-01 NOTE — Progress Notes (Signed)
Therapist, nutritional Palliative Care Consult Note Telephone: [phone]  Fax: [phone]  PATIENT NAME: [name] [address] [place] [address] [phone] (home)  DOB: 06/13/1940 MRN: [id number]  PRIMARY CARE PROVIDER:    Adron Bene, MD,  [address] [place] [address] [phone]  REFERRING PROVIDER:  Bonnita Nasuti, NP   RESPONSIBLE PARTY:   Spouse [phone] c Contact: Tabitha  God-daughter [phone]  Contact Information     Name Relation Home Work Mobile   Mebanes,Denise Niece   [phone]   Malkia, Nippert [phone]  [phone]      I met face to face with patient and at facility. Palliative Care was asked to follow this patient by consultation request of  Lowella Petties NP to address advance care planning, complex medical decision making and goals of care clarification.    Visit consisted of counseling and education dealing with the complex and emotionally intense issues of symptom management and palliative care in the setting of serious and potentially life-threatening illness. Palliative care team will continue to support patient, patient's family, and medical team.    ASSESSMENT AND / RECOMMENDATIONS:   CODE STATUS: Patient is a Full code  Goals of Care: Goals include to maximize quality of life and symptom management  Patient appears content, in no distress.   Will continue to follow, monitor comfort, weight, appetite, progression of chronic disease, symptoms of pain.  Nursing staff updated, no new changes; medical goals,  plan of care and medications reviewed.   Symptom Management/Plan: Dementia: Progressive.  Continue memantine as ordered.  Memory loss/confusion at baseline.  Encourage reminiscence, word search/puzzles, cueing for recollection.  Promote calm approach and engaging environment.  Safety and fall precautions.  incontinent of bowel and bladder FAST 6D.   Type 2  DM: Stable/controlled A1c  5.5 Apri '24, 6.09 Jan 2022. Continue Trulicity (Dulaglutide), Insulin as ordered. No concentrated sweets. Diabetic diet, no empty caloric beverages. Check CBG as ordered to help ensure tight control.  Repeat A1c in 3 to 6 months.  CFH: Stable, no edema, no weight gain in the last month. Continue Torsemide as ordered. Elevate BLE during the day as much as possible to promote circulation. Adhere to Fluid and salt limits. Monitor for shortness of breath, edema, chest pain. Weight per facility protocol.  Report weight gain of 2 pounds in a day or 5 pounds in a week.  Follow up: Palliative care will continue to follow for complex medical decision making, advance care planning, and clarification of goals. Return 6 weeks or prn. Encouraged to call provider sooner with any concerns.   Family /Caregiver/Community Supports: Patient is in SNF for ongoing care  HOSPICE ELIGIBILITY/DIAGNOSIS: TBD  Chief Complaint: Follow up visit  HISTORY OF PRESENT ILLNESS:  [name] is a 82 y.o. year old female  with multiple morbidities requiring close monitoring/management with high risk of complications and morbidity: Advanced Dementia,Type 2 DM. CHF, Anemia.  Patient denies pain/discomfort; did not have any complaint, denied pain/discomfort.  FLACC 0.  History obtained from review of EMR, discussion with primary team, caregiver, family and/or [name].   Review and summarization of Epic records shows history from other than patient. Rest of 10 point ROS asked and negative.  I reviewed as needed, available labs, patient records, imaging, studies and related documents from the EMR.   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   Fatigue 07/10/2011   Type 2 diabetes mellitus 09/15/2012   Chronic diastolic CHF (congestive heart failure) 10/03/2013   Essential hypertension  03/02/2012   CKD (chronic kidney disease), stage III    Simple febrile convulsions 10/21/2014   Dementia ([name] 01/30/2015   Healthcare maintenance 06/20/2015   Seborrheic keratosis 06/20/2015   PAF (paroxysmal atrial fibrillation) 09/29/2015   Pain in joint of right shoulder 10/22/2015   Peripheral vascular disease 12/30/2015   Dry skin 02/17/2016   Venous insufficiency of both lower extremities 04/27/2016   Epistaxis 07/30/2016   Osteopenia after menopause 01/13/2017   Overgrown toenails 03/18/2017   Pulmonary HTN 04/01/2017   Heart murmur 04/01/2017   Lower extremity pain, bilateral 07/15/2017   Pain localized to upper abdomen 10/05/2017   Leg weakness, bilateral 12/23/2017   Hyperpigmented skin lesion 02/07/2018   Iron deficiency anemia due to chronic blood loss 04/19/2018   Port-A-Cath in place 05/23/2018   Neuropathy 07/27/2019   Seizures 09/2014   Hearing loss 05/29/2020   Bullous lesion 10/08/2020   History of GI bleed 01/18/2021   Resolved Ambulatory Problems    Diagnosis Date Noted   Type 2 diabetes mellitus with complication, with long-term current use of insulin 04/08/2006   HYPERLIPIDEMIA 04/08/2006   Anemia, iron deficiency 04/21/2006   Iron deficiency anemia due to chronic blood loss 04/08/2006   HYPERTENSION, BENIGN SYSTEMIC 04/08/2006   SYMPTOM, EPISTAXIS 05/21/2006   Anemia 02/01/2011   GI bleed 02/01/2011   Hypokalemia 02/01/2011   Obesity 09/15/2012   Abnormal CXR 09/15/2012   Dyspnea 10/03/2013   Acute on chronic heart failure with preserved ejection fraction (HFpEF) 10/03/2013   Upper GI bleed 07/05/2014   Poor venous access    SOB (shortness of breath) 07/27/2014   Acute kidney injury ([name] 08/27/2014   Acute encephalopathy 08/27/2014   Acute renal failure superimposed on stage 3 chronic kidney disease ([name] 08/27/2014   Anemia of chronic disease 08/27/2014   UTI (lower urinary tract infection) 08/27/2014   Anemia 09/04/2014   Acute lower GI  bleeding 09/04/2014   Acute blood loss anemia    Hyperammonemia 10/03/2014   Status epilepticus ([name] 10/03/2014   Hypotension 11/13/2014   Symptomatic anemia 11/29/2014   Bleeding gastrointestinal    SOB (shortness of breath) 01/18/2015   Lichen planus 07/17/2015   Acute GI bleeding 09/28/2015   Acute on chronic blood loss anemia    Angiodysplasia of stomach with hemorrhage    AVM (arteriovenous malformation) of stomach, acquired with hemorrhage    Hyperglycemia due to type 2 diabetes mellitus 05/21/2016   Muscle spasm of both lower legs 06/15/2016   Hematemesis 12/04/2017   Acute right hip pain 03/10/2018   Hyperkalemia 10/18/2018   Hypervolemia due to congestive heart failure 08/02/2019   Abnormal LFTs 08/09/2019   Impacted cerumen of left ear 07/15/2020   Hypoglycemia 09/29/2020   Acute upper GI bleed 12/23/2020   Hemorrhagic shock    Altered mental status 12/31/2020   Acute metabolic encephalopathy 01/09/2021   AKI (acute kidney injury)    Dehydration    AKI (acute kidney injury) 01/28/2021   Altered mental status 02/03/2021   AMS (altered mental status) 02/04/2021   Acute upper GI bleed 11/21/2021   Past Medical History:  Diagnosis Date   Chronic anemia    Chronic GI bleeding    Family history of anesthesia complication    Frequent nosebleeds    Gastric AV  malformation    GERD (gastroesophageal reflux disease)    History of blood transfusion "several"   History of epistaxis    HTN (hypertension), benign 03/02/2012   Hyperlipidemia    Iron deficiency anemia    Osler-Weber-Rendu syndrome ([name]    Pneumonia 1990's X 2   Telangiectasia    Type II diabetes mellitus ([name]     SOCIAL HX:  Social History   Tobacco Use   Smoking status: Former    Packs/day: 1.00    Years: 20.00    Additional pack years: 0.00    Total pack years: 20.00    Types: Cigarettes    Quit date: 02/10/1971    Years since quitting: 51.3   Smokeless tobacco: Never   Tobacco comments:     09/15/2012 "smoked 50-60 yr ago"  Substance Use Topics   Alcohol use: No    Alcohol/week: 0.0 standard drinks of alcohol     FAMILY HX:  Family History  Problem Relation Age of Onset   Healthy Mother    Stroke Father    Diabetes Niece    Breast cancer Other    Malignant hyperthermia Neg Hx    Seizures Neg Hx    Lung cancer Neg Hx       ALLERGIES:  Allergies  Allergen Reactions   Aspirin Nausea And Vomiting      PERTINENT MEDICATIONS:  Outpatient Encounter Medications as of 06/01/2022  Medication Sig   acetaminophen (TYLENOL) 325 MG tablet Take 650 mg by mouth every 4 (four) hours as needed for mild pain.   amLODipine (NORVASC) 5 MG tablet Take 1 tablet (5 mg total) by mouth daily.   ascorbic acid (VITAMIN C) 250 MG tablet Take 250 mg by mouth 2 (two) times daily.   bisacodyl (DULCOLAX) 10 MG suppository Place 10 mg rectally daily as needed (constipation).   ferrous sulfate 325 (65 FE) MG tablet Take 325 mg by mouth 2 (two) times daily.   gabapentin (NEURONTIN) 100 MG capsule Take 1 capsule (100 mg total) by mouth in the morning AND 2 capsules (200 mg total) at bedtime. (Patient taking differently: 100 mg in the morning, 200 mg at bedtime.)   guaiFENesin (ROBITUSSIN) 100 MG/5ML liquid Take 200 mg by mouth every 4 (four) hours as needed for cough.   insulin lispro (HUMALOG KWIKPEN) 100 UNIT/ML KwikPen Inject 3 Units into the skin 3 (three) times daily. (Patient taking differently: Inject 4-6 Units into the skin See admin instructions. 3 entries on paperwork from facility: 4 units in the morning for hyperglycemia, hold for BS < 120. 5 units once daily, hold for BS < 120. 6 units once daily, hold for BS < 120.)   lactulose (CHRONULAC) 10 GM/15ML solution Take 10 g by mouth 3 (three) times daily.   LANTUS SOLOSTAR 100 UNIT/ML Solostar Pen Inject 10 Units into the skin at bedtime.   levETIRAcetam (KEPPRA) 500 MG tablet Take 1 tablet (500 mg total) by mouth 2 (two) times daily.    Magnesium Hydroxide (MILK OF MAGNESIA PO) Take 30 mLs by mouth daily.   memantine (NAMENDA) 10 MG tablet TAKE 1 TABLET BY MOUTH TWICE DAILY (Patient taking differently: Take 10 mg by mouth 2 (two) times daily.)   mirtazapine (REMERON) 7.5 MG tablet Take 7.5 mg by mouth at bedtime.   Multiple Vitamin (MULTIVITAMIN) tablet Take 1 tablet by mouth daily.   omeprazole (PRILOSEC) 40 MG capsule Take 40 mg by mouth 2 (two) times daily.   oxymetazoline (AFRIN) 0.05 %  nasal spray Place 2 sprays into both nostrils every 6 (six) hours as needed (nose bleeds).   PROTEIN PO Take 30 mLs by mouth daily.   torsemide (DEMADEX) 20 MG tablet Take 1 tablet (20 mg total) by mouth as needed (please take 1 20mg  tablet as needed for weight gain of 2 pounds overnight or 5 pounds in a week). (Patient taking differently: Take 20 mg by mouth daily as needed (weight gain of 2 lbs overnight or 5 lbs in 1 week.).)   triamcinolone ointment (KENALOG) 0.5 % Apply 1 Application topically 2 (two) times daily.   TRULICITY 3 MG/0.5ML SOPN Inject 3 mg into the skin once a week. (Patient taking differently: Inject 3 mg into the skin every Thursday.)   Facility-Administered Encounter Medications as of 06/01/2022  Medication   sodium chloride 0.9 % injection 10 mL   sodium chloride flush (NS) 0.9 % injection 10 mL   I spent 35 minutes providing this consultation; time includes spent with patient/family, chart review and documentation. More than 50% of the time in this consultation was spent on care coordination.  Thank you for the opportunity to participate in the care of [name].  The palliative care team will continue to follow. Please call our office at [phone] if we can be of additional assistance.   Note: Portions of this note were generated with Scientist, clinical (histocompatibility and immunogenetics). Dictation errors may occur despite best attempts at proofreading.  Rosaura Carpenter, NP

## 2022-06-03 ENCOUNTER — Inpatient Hospital Stay: Payer: Medicare Other

## 2022-06-03 DIAGNOSIS — F0390 Unspecified dementia without behavioral disturbance: Secondary | ICD-10-CM | POA: Diagnosis not present

## 2022-06-03 DIAGNOSIS — Z95828 Presence of other vascular implants and grafts: Secondary | ICD-10-CM

## 2022-06-03 DIAGNOSIS — I780 Hereditary hemorrhagic telangiectasia: Secondary | ICD-10-CM

## 2022-06-03 DIAGNOSIS — D500 Iron deficiency anemia secondary to blood loss (chronic): Secondary | ICD-10-CM

## 2022-06-03 DIAGNOSIS — R911 Solitary pulmonary nodule: Secondary | ICD-10-CM | POA: Diagnosis not present

## 2022-06-03 DIAGNOSIS — K31819 Angiodysplasia of stomach and duodenum without bleeding: Secondary | ICD-10-CM

## 2022-06-03 DIAGNOSIS — D508 Other iron deficiency anemias: Secondary | ICD-10-CM | POA: Diagnosis not present

## 2022-06-03 LAB — CBC WITH DIFFERENTIAL/PLATELET
Abs Immature Granulocytes: 0.02 10*3/uL (ref 0.00–0.07)
Basophils Absolute: 0 10*3/uL (ref 0.0–0.1)
Basophils Relative: 1 %
Eosinophils Absolute: 0.3 10*3/uL (ref 0.0–0.5)
Eosinophils Relative: 6 %
HCT: 31.8 % — ABNORMAL LOW (ref 36.0–46.0)
Hemoglobin: 10.7 g/dL — ABNORMAL LOW (ref 12.0–15.0)
Immature Granulocytes: 0 %
Lymphocytes Relative: 18 %
Lymphs Abs: 1 10*3/uL (ref 0.7–4.0)
MCH: 31.9 pg (ref 26.0–34.0)
MCHC: 33.6 g/dL (ref 30.0–36.0)
MCV: 94.9 fL (ref 80.0–100.0)
Monocytes Absolute: 0.6 10*3/uL (ref 0.1–1.0)
Monocytes Relative: 10 %
Neutro Abs: 3.5 10*3/uL (ref 1.7–7.7)
Neutrophils Relative %: 65 %
Platelets: 169 10*3/uL (ref 150–400)
RBC: 3.35 MIL/uL — ABNORMAL LOW (ref 3.87–5.11)
RDW: 14.3 % (ref 11.5–15.5)
WBC: 5.4 10*3/uL (ref 4.0–10.5)
nRBC: 0 % (ref 0.0–0.2)

## 2022-06-03 LAB — FERRITIN: Ferritin: 338 ng/mL — ABNORMAL HIGH (ref 11–307)

## 2022-06-03 MED ORDER — ALTEPLASE 2 MG IJ SOLR
2.0000 mg | Freq: Once | INTRAMUSCULAR | Status: AC | PRN
  Administered 2022-06-03: 2 mg
  Filled 2022-06-03: qty 2

## 2022-06-03 MED ORDER — SODIUM CHLORIDE 0.9% FLUSH
10.0000 mL | INTRAVENOUS | Status: DC | PRN
  Administered 2022-06-03: 10 mL

## 2022-06-03 MED ORDER — HEPARIN SOD (PORK) LOCK FLUSH 100 UNIT/ML IV SOLN
500.0000 [IU] | Freq: Once | INTRAVENOUS | Status: AC | PRN
  Administered 2022-06-03: 500 [IU]

## 2022-06-05 ENCOUNTER — Encounter: Payer: Self-pay | Admitting: Hematology

## 2022-06-05 ENCOUNTER — Inpatient Hospital Stay (HOSPITAL_BASED_OUTPATIENT_CLINIC_OR_DEPARTMENT_OTHER): Payer: Medicare Other | Admitting: Hematology

## 2022-06-05 VITALS — BP 136/68 | HR 64 | Temp 98.7°F | Resp 16

## 2022-06-05 DIAGNOSIS — D508 Other iron deficiency anemias: Secondary | ICD-10-CM | POA: Diagnosis not present

## 2022-06-05 DIAGNOSIS — I780 Hereditary hemorrhagic telangiectasia: Secondary | ICD-10-CM

## 2022-06-05 DIAGNOSIS — R911 Solitary pulmonary nodule: Secondary | ICD-10-CM

## 2022-06-05 DIAGNOSIS — F0390 Unspecified dementia without behavioral disturbance: Secondary | ICD-10-CM | POA: Diagnosis not present

## 2022-06-05 NOTE — Assessment & Plan Note (Signed)
-  She receives blood transfusion as needed, but has not needed since 04/2018 when she started Beva. -She is on Avastin intermittently/PRN due to high proteinuria. -she last required blood transfusion on 04/17/21. -she received beva and IV Feraheme on 07/01/21 and 07/30/21. She responded well  -Lab reviewed, hgb 9.5. Plan to proceed with beva and iv feraheme as scheduled. -we will continue to follow her labs monthly through her facility, [facility], and I will see her back in 6 months.

## 2022-06-05 NOTE — Progress Notes (Signed)
[facility]   [phone]) [phone] Fax:(336) [phone]   Clinic Follow up Note   Patient Care Team: Adron Bene, MD as PCP - General Quintella Reichert, MD as PCP - Cardiology (Cardiology) Malachy Mood, MD as Consulting Physician (Hematology) Reather Littler, MD as Consulting Physician (Endocrinology)  Date of Service:  06/05/2022  CHIEF COMPLAINT: f/u of  HHT, anemia   CURRENT THERAPY:  1. Iv feraheme as needed if ferritin<30, latest on 07/30/21 2. Avastin 5mg /kg, as needed for anemia, starting 05/09/18.              -given intermittently due to protenuria 3. Blood transfusion if Hg<8.0   ASSESSMENT:   [name] is a 82 y.o. female with   HHT (hereditary hemorrhagic telangiectasia) ([name] -She receives blood transfusion as needed, but has not needed since 04/2018 when she started Beva. -She is on Avastin intermittently/PRN due to high proteinuria. -she last required blood transfusion on 04/17/21. -she received beva and IV Feraheme on 07/01/21 and 07/30/21. She responded well  -Lab reviewed, hgb 10.7, she has no clinically signs of GI bleeding, her ferritin was 338 2 days ago, no need IV iron.  Will hold on bevacizumab also. -Her overall condition has deteriorated in the past 3 to 4 months, she is still bedbound and sleeping most the time, eating less.  I discussed Perative care and hospice with her niece, she will discuss with patient's husband, but she did not think they are ready for hospice.  I discussed the logistics and the benefits of hospice service, she voiced good understanding.  She would like to continue medical care if she needs.  2. LUL lung nodule, likely malignant  -initially seen in 01/2021, felt to be pneumonia -recent chest CT 08/28/21 showed increase in size to 2.6 cm.  -she saw Dr. Celine Mans on 10/20/21, who does not feel she would tolerate anesthesia for biopsy or any subsequent treatment if she's found to have cancer. Her family has agreed not to do the  work up and treatment.    3. 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 -she last received IV Feraheme on 07/30/21 -will give iv iron only if ferritin <10   4. Dementia, Deconditioning -She takes Aricept and memantine for dementia, and Keppra for seizures.  -she is currently living in [facility], since 01/2021  -Her overall condition has deteriorated in the past 3 to 4 months, she is bedbound, not eating much, very drowsy during her visit.  I discussed Perative care and hospice with patient's niece.     PLAN: - lab reviewed hg 10.7, no iv iron or beva today  -Discuss palliative care and hospice with pt niece, she declined  -lab and f/u  and beva and iv iron in 4 months    INTERVAL HISTORY:  [name] is here for a follow up of  HHT, anemia . She was last seen by me on 11/06/2021. She presents to the clinic accompanied by niece. Pt niece state that the pt is not eating much or getting up out the bed. Pt niece states that she sleeps a lot.    All other systems were reviewed with the patient and are negative.  MEDICAL HISTORY:  Past Medical History:  Diagnosis Date   Chronic anemia    Chronic diastolic CHF (congestive heart failure) ([name] 10/03/2013  Chronic GI bleeding    /notes 11/29/2014   Family history of anesthesia complication    "niece has a hard time coming out" (09/15/2012)   Frequent nosebleeds    chronic   Gastric AV malformation    /notes 11/29/2014   GERD (gastroesophageal reflux disease)    Heart murmur 04/01/2017   Moderate AVSC on echo 09/2016   Hematemesis 12/04/2017   History of blood transfusion "several"   History of epistaxis    HTN (hypertension), benign 03/02/2012   Hyperlipidemia    Iron deficiency anemia    chronic infusions"   Lichen planus    Both lower extremities   Osler-Weber-Rendu syndrome ([name]     Hattie Perch 11/29/2014   Overgrown toenails 03/18/2017   Pneumonia 1990's X 2   Pulmonary HTN ([name] 04/01/2017   PASP on echo 09/2016 and by echo 2019   Seizures ([name] 09/2014   Symptomatic anemia 11/29/2014   Telangiectasia    Gastric    Type II diabetes mellitus (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    SURGICAL HISTORY: Past Surgical History:  Procedure Laterality Date   CATARACT EXTRACTION     "I think it was just one eye"   ESOPHAGOGASTRODUODENOSCOPY  02/26/2011   Procedure: ESOPHAGOGASTRODUODENOSCOPY (EGD);  Surgeon: Barrie Folk, MD;  Location: [facility];  Service: Endoscopy;  Laterality: N/A;   ESOPHAGOGASTRODUODENOSCOPY N/A 11/08/2012   Procedure: ESOPHAGOGASTRODUODENOSCOPY (EGD);  Surgeon: Theda Belfast, MD;  Location: [facility]OSCOPY;  Service: Endoscopy;  Laterality: N/A;   ESOPHAGOGASTRODUODENOSCOPY N/A 10/04/2013   Procedure: ESOPHAGOGASTRODUODENOSCOPY (EGD);  Surgeon: Vertell Novak., MD;  Location: [facility]COPY;  Service: Endoscopy;  Laterality: N/A;  with APC on stand-by   ESOPHAGOGASTRODUODENOSCOPY N/A 07/06/2014   Procedure: ESOPHAGOGASTRODUODENOSCOPY (EGD);  Surgeon: Vida Rigger, MD;  Location: [facility]COPY;  Service: Endoscopy;  Laterality: N/A;   ESOPHAGOGASTRODUODENOSCOPY N/A 09/05/2014   Procedure: ESOPHAGOGASTRODUODENOSCOPY (EGD);  Surgeon: Carman Ching, MD;  Location: [facility];  Service: Endoscopy;  Laterality: N/A;  APC on standby to control bleeding   ESOPHAGOGASTRODUODENOSCOPY N/A 11/29/2014   Procedure: ESOPHAGOGASTRODUODENOSCOPY (EGD);  Surgeon: Charlott Rakes, MD;  Location[facility] END[facility]COPY;  Service: Endoscopy;  Laterality: N/A;   ESOPHAGOGASTRODUODENOSCOPY N/A 09/28/2015   Procedure: ESOPHAGOGASTRODUODENOSCOPY (EGD);  Surgeon: Vida Rigger, MD;  Location: [facility] ENDOSCOPY;  Service: Endoscopy;  Laterality: N/A;   ESOPHAGOGASTRODUODENOSCOPY N/A 11/22/2021   Procedure: ESOPHAGOGASTRODUODENOSCOPY (EGD);  Surgeon: Lynann Bologna, DO;  Location: [facility];   Service: Gastroenterology;  Laterality: N/A;   ESOPHAGOGASTRODUODENOSCOPY (EGD) WITH PROPOFOL N/A 12/04/2017   Procedure: ESOPHAGOGASTRODUODENOSCOPY (EGD) WITH PROPOFOL;  Surgeon: Charlott Rakes, MD;  Location: [facility] ENDOSCOPY;  Service: Endoscopy;  Laterality: N/A;   ESOPHAGOGASTRODUODENOSCOPY (EGD) WITH PROPOFOL N/A 12/24/2020   Procedure: ESOPHAGOGASTRODUODENOSCOPY (EGD) WITH PROPOFOL;  Surgeon: Willis Modena, MD;  Location: [facility] ENDOSCOPY;  Service: Endoscopy;  Laterality: N/A;   ESOPHAGOGASTRODUODENOSCOPY (EGD) WITH PROPOFOL N/A 01/19/2021   Procedure: ESOPHAGOGASTRODUODENOSCOPY (EGD) WITH PROPOFOL;  Surgeon: Charlott Rakes, MD;  Location: [facility] ENDOSCOPY;  Service: Endoscopy;  Laterality: N/A;   ESOPHAGOGASTRODUODENOSCOPY (EGD) WITH PROPOFOL N/A 02/06/2021   Procedure: ESOPHAGOGASTRODUODENOSCOPY (EGD) WITH PROPOFOL;  Surgeon: Kerin Salen, MD;  Location: [facility] ENDOSCOPY;  Service: Gastroenterology;  Laterality: N/A;   ESOPHAGOGASTRODUODENOSCOPY ENDOSCOPY  08/19/2006   with laser treatment   HEMOSTASIS CLIP PLACEMENT  12/24/2020   Procedure: HEMOSTASIS CLIP PLACEMENT;  Surgeon: Willis Modena, MD;  Location: [facility] ENDOSCOPY;  Service: Endoscopy;;   HEMOSTASIS CLIP PLACEMENT  01/19/2021   Procedure: HEMOSTASIS CLIP PLACEMENT;  Surgeon: Charlott Rakes, MD;  Location: [facility] ENDOSCOPY;  Service: Endoscopy;;   HEMOSTASIS CLIP PLACEMENT  11/22/2021   Procedure: HEMOSTASIS CLIP PLACEMENT;  Surgeon: Lynann Bologna, DO;  Location: [name];  Service: Gastroenterology;;   HOT HEMOSTASIS N/A 07/06/2014   Procedure: HOT HEMOSTASIS (ARGON PLASMA COAGULATION/BICAP);  Surgeon: Vida Rigger, MD;  Location: [facility] ENDOSCOPY;  Service: Endoscopy;  Laterality: N/A;   HOT HEMOSTASIS N/A 09/28/2015   Procedure: HOT HEMOSTASIS (ARGON PLASMA COAGULATION/BICAP);  Surgeon: Vida Rigger, MD;  Location: [facility] ENDOSCOPY;  Service: Endoscopy;  Laterality: N/A;   HOT HEMOSTASIS N/A 12/04/2017   Procedure: HOT HEMOSTASIS (ARGON  PLASMA COAGULATION/BICAP);  Surgeon: Charlott Rakes, MD;  Location: [facility] ENDOSCOPY;  Service: Endoscopy;  Laterality: N/A;   HOT HEMOSTASIS  11/22/2021   Procedure: HOT HEMOSTASIS (ARGON PLASMA COAGULATION/BICAP);  Surgeon: Lynann Bologna, DO;  Location: [facility] ENDOSCOPY;  Service: Gastroenterology;;   [address] SELECTIVE EACH ADDITIONAL VESSEL  02/05/2021   IR ANGIOGRAM VISCERAL SELECTIVE  02/05/2021   IR EMBO ART  VEN HEMORR LYMPH EXTRAV  INC GUIDE ROADMAPPING  02/05/2021   IR US GUIDE VASC ACCESS RIGHT  02/05/2021   NASAL HEMORRHAGE CONTROL     "for bleeding"    SAVORY DILATION  02/26/2011   Procedure: SAVORY DILATION;  Surgeon: Barrie Folk, MD;  Location: [name];  Service: Endoscopy;  Laterality: N/A;  c-arm needed   SCLEROTHERAPY  12/24/2020   Procedure: SCLEROTHERAPY;  Surgeon: Willis Modena, MD;  Location: [facility] ENDOSCOPY;  Service: Endoscopy;;   SCLEROTHERAPY  01/19/2021   Procedure: Susa Day;  Surgeon: Charlott Rakes, MD;  Location: [facility] ENDOSCOPY;  Service: Endoscopy;;   SUBMUCOSAL INJECTION  12/04/2017   Procedure: SUBMUCOSAL INJECTION;  Surgeon: Charlott Rakes, MD;  Location: [facility] ENDOSCOPY;  Service: Endoscopy;;    I have reviewed the social history and family history with the patient and they are unchanged from previous note.  ALLERGIES:  is allergic to aspirin.  MEDICATIONS:  Current Outpatient Medications  Medication Sig Dispense Refill   acetaminophen (TYLENOL) 325 MG tablet Take 650 mg by mouth every 4 (four) hours as needed for mild pain.     amLODipine (NORVASC) 5 MG tablet Take 1 tablet (5 mg total) by mouth daily. 90 tablet 3   ascorbic acid (VITAMIN C) 250 MG tablet Take 250 mg by mouth 2 (two) times daily.     bisacodyl (DULCOLAX) 10 MG suppository Place 10 mg rectally daily as needed (constipation).     ferrous sulfate 325 (65 FE) MG tablet Take 325 mg by mouth 2 (two) times daily.     gabapentin (NEURONTIN) 100 MG capsule Take 1 capsule (100 mg  total) by mouth in the morning AND 2 capsules (200 mg total) at bedtime. (Patient taking differently: 100 mg in the morning, 200 mg at bedtime.)     insulin lispro (HUMALOG KWIKPEN) 100 UNIT/ML KwikPen Inject 3 Units into the skin 3 (three) times daily. (Patient taking differently: Inject 4-6 Units into the skin See admin instructions. 3 entries on paperwork from facility: 4 units in the morning for hyperglycemia, hold for BS < 120. 5 units once daily, hold for BS < 120. 6 units once daily, hold for BS < 120.)     lactulose (CHRONULAC) 10 GM/15ML solution Take 10 g by mouth 3 (three) times daily.     LANTUS SOLOSTAR 100 UNIT/ML Solostar Pen Inject 10 Units into the skin at bedtime. 15 mL 1   levETIRAcetam (KEPPRA) 500 MG tablet Take 1 tablet (500 mg total) by mouth 2 (  two) times daily.     memantine (NAMENDA) 10 MG tablet TAKE 1 TABLET BY MOUTH TWICE DAILY (Patient taking differently: Take 10 mg by mouth 2 (two) times daily.) 60 tablet 10   mirtazapine (REMERON) 7.5 MG tablet Take 7.5 mg by mouth at bedtime.     Multiple Vitamin (MULTIVITAMIN) tablet Take 1 tablet by mouth daily.     omeprazole (PRILOSEC) 40 MG capsule Take 40 mg by mouth 2 (two) times daily.     triamcinolone ointment (KENALOG) 0.5 % Apply 1 Application topically 2 (two) times daily.     TRULICITY 3 MG/0.5ML SOPN Inject 3 mg into the skin once a week. 2 mL 2   guaiFENesin (ROBITUSSIN) 100 MG/5ML liquid Take 200 mg by mouth every 4 (four) hours as needed for cough. (Patient not taking: Reported on 06/05/2022)     Magnesium Hydroxide (MILK OF MAGNESIA PO) Take 30 mLs by mouth daily.     oxymetazoline (AFRIN) 0.05 % nasal spray Place 2 sprays into both nostrils every 6 (six) hours as needed (nose bleeds). (Patient not taking: Reported on 06/05/2022)     PROTEIN PO Take 30 mLs by mouth daily. (Patient not taking: Reported on 06/05/2022)     torsemide (DEMADEX) 20 MG tablet Take 1 tablet (20 mg total) by mouth as needed (please take 1  20mg  tablet as needed for weight gain of 2 pounds overnight or 5 pounds in a week). (Patient taking differently: Take 20 mg by mouth daily as needed (weight gain of 2 lbs overnight or 5 lbs in 1 week.).) 180 tablet 3   No current facility-administered medications for this visit.   Facility-Administered Medications Ordered in Other Visits  Medication Dose Route Frequency Provider Last Rate Last Admin   sodium chloride 0.9 % injection 10 mL  10 mL Intracatheter PRN Malachy Mood, MD       sodium chloride flush (NS) 0.9 % injection 10 mL  10 mL Intracatheter PRN Malachy Mood, MD   10 mL at 08/18/19 1717    PHYSICAL EXAMINATION: ECOG PERFORMANCE STATUS: 3 - Symptomatic, >50% confined to bed  Vitals:   06/05/22 1044  BP: 136/68  Pulse: 64  Resp: 16  Temp: 98.7 F (37.1 C)  SpO2: 100%   Wt Readings from Last 3 Encounters:  03/16/22 128 lb (58.1 kg)  12/19/21 128 lb 8.5 oz (58.3 kg)  11/22/21 128 lb 8.5 oz (58.3 kg)     GENERAL:alert, no distress and comfortable SKIN: skin color normal, no rashes or significant lesions EYES: normal, Conjunctiva are pink and non-injected, sclera clear  NEURO: alert & oriented x 3 with fluent speech  LABORATORY DATA:  I have reviewed the data as listed    Latest Ref Rng & Units 06/03/2022   10:19 AM 03/16/2022    7:22 PM 01/28/2022   10:25 AM  CBC  WBC 4.0 - 10.5 K/uL 5.4  6.0  3.3   Hemoglobin 12.0 - 15.0 g/dL 96.0  45.4  9.7   Hematocrit 36.0 - 46.0 % 31.8  35.8  34.0   Platelets 150 - 400 K/uL 169  151  142         Latest Ref Rng & Units 03/16/2022    7:22 PM 01/28/2022    9:38 AM 12/19/2021    4:50 PM  CMP  Glucose 70 - 99 mg/dL 80  098  119   BUN 8 - 23 mg/dL 16  24  15    Creatinine 0.44 - 1.00 mg/dL  0.95  1.28  1.27   Sodium 135 - 145 mmol/L 144  147  144   Potassium 3.5 - 5.1 mmol/L 3.9  4.0  4.3   Chloride 98 - 111 mmol/L 111  115  112   CO2 22 - 32 mmol/L 24  22  25    Calcium 8.9 - 10.3 mg/dL 8.7  8.9  9.0   Total Protein 6.5 - 8.1  g/dL 4.8  5.2  5.4   Total Bilirubin 0.3 - 1.2 mg/dL 0.3  0.8  0.6   Alkaline Phos 38 - 126 U/L 175  111  98   AST 15 - 41 U/L 44  51  21   ALT 0 - 44 U/L 21  18  11        RADIOGRAPHIC STUDIES: I have personally reviewed the radiological images as listed and agreed with the findings in the report. No results found.    No orders of the defined types were placed in this encounter.  All questions were answered. The patient knows to call the clinic with any problems, questions or concerns. No barriers to learning was detected. The total time spent in the appointment was 25 minutes.     Malachy Mood, MD 06/05/2022   Carolin Coy, CMA, am acting as scribe for Malachy Mood, MD.   I have reviewed the above documentation for accuracy and completeness, and I agree with the above.

## 2022-06-10 ENCOUNTER — Encounter: Payer: Self-pay | Admitting: Hematology

## 2022-06-17 ENCOUNTER — Ambulatory Visit (INDEPENDENT_AMBULATORY_CARE_PROVIDER_SITE_OTHER): Payer: Medicare (Managed Care) | Admitting: Internal Medicine

## 2022-06-17 ENCOUNTER — Encounter: Payer: Self-pay | Admitting: Internal Medicine

## 2022-06-17 VITALS — BP 120/68 | HR 80 | Temp 98.2°F

## 2022-06-17 DIAGNOSIS — F0390 Unspecified dementia without behavioral disturbance: Secondary | ICD-10-CM | POA: Diagnosis not present

## 2022-06-17 DIAGNOSIS — R911 Solitary pulmonary nodule: Secondary | ICD-10-CM

## 2022-06-17 NOTE — Progress Notes (Signed)
[name]    [id number]    [birth year]  Primary Care Physician:White, Christiane Ha, MD Date of Appointment: 06/17/2022 Established Patient Visit  Chief complaint:   Chief Complaint  Patient presents with   Follow-up    No c/o     HPI: [name]  is a 82 y.o. 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    Interval Updates: Here for follow up. Has established with palliative are.  Severe dementia. Resides at [facility]. Niece is here with her. She is sleeping more. Not interested in eating. Does not talk much. Continues to be full assist with ADLs.    Niece Denies wants to know what her expected life span would be from a cancer perspective since we have decided treatment would not prolong the length and quality of her life.   I have reviewed the patient's family social and past medical history and updated as appropriate.   Past Medical History:  Diagnosis Date   Chronic anemia    Chronic diastolic CHF (congestive heart failure) ([name] 10/03/2013   Chronic GI bleeding    /notes 11/29/2014   Family history of anesthesia complication    "niece has a hard time coming out" (09/15/2012)   Frequent nosebleeds    chronic   Gastric AV malformation    /notes 11/29/2014   GERD (gastroesophageal reflux disease)    Heart murmur 04/01/2017   Moderate AVSC on echo 09/2016   Hematemesis 12/04/2017   History of blood transfusion "several"   History of epistaxis    HTN (hypertension), benign 03/02/2012   Hyperlipidemia    Iron deficiency anemia    chronic infusions"   Lichen planus    Both lower extremities   Osler-Weber-Rendu syndrome ([name]    Hattie Perch 11/29/2014   Overgrown toenails 03/18/2017   Pneumonia 1990's X 2   Pulmonary HTN ([name] 04/01/2017   PASP on echo 09/2016 and by echo 2019   Seizures ([name] 09/2014   Symptomatic anemia 11/29/2014    Telangiectasia    Gastric    Type II diabetes mellitus (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    Past Surgical History:  Procedure Laterality Date   CATARACT EXTRACTION     "I think it was just one eye"   ESOPHAGOGASTRODUODENOSCOPY  02/26/2011   Procedure: ESOPHAGOGASTRODUODENOSCOPY (EGD);  Surgeon: Barrie Folk, MD;  Location: [facility] ENDOSCOPY;  Service: Endoscopy;  Laterality: N/A;   ESOPHAGOGASTRODUODENOSCOPY N/A 11/08/2012   Procedure: ESOPHAGOGASTRODUODENOSCOPY (EGD);  Surgeon: Theda Belfast, MD;  Location: [facility] ENDOSCOPY;  Service: Endoscopy;  Laterality: N/A;   ESOPHAGOGASTRODUODENOSCOPY N/A 10/04/2013   Procedure: ESOPHAGOGASTRODUODENOSCOPY (EGD);  Surgeon: Vertell Novak., MD;  Location: [facility] ENDOSCOPY;  Service: Endoscopy;  Laterality: N/A;  with APC on stand-by   ESOPHAGOGASTRODUODENOSCOPY N/A 07/06/2014   Procedure: ESOPHAGOGASTRODUODENOSCOPY (EGD);  Surgeon: Vida Rigger, MD;  Location: [facility] ENDOSCOPY;  Service: Endoscopy;  Laterality: N/A;   ESOPHAGOGASTRODUODENOSCOPY N/A 09/05/2014   Procedure: ESOPHAGOGASTRODUODENOSCOPY (EGD);  Surgeon: Carman Ching, MD;  Location: [facility] ENDOSCOPY;  Service: Endoscopy;  Laterality: N/A;  APC on standby to control bleeding   ESOPHAGOGASTRODUODENOSCOPY N/A 11/29/2014   Procedure: ESOPHAGOGASTRODUODENOSCOPY (EGD);  Surgeon: Charlott Rakes, MD;  Location: [facility] ENDOSCOPY;  Service: Endoscopy;  Laterality: N/A;   ESOPHAGOGASTRODUODENOSCOPY N/A 09/28/2015   Procedure: ESOPHAGOGASTRODUODENOSCOPY (EGD);  Surgeon: Vida Rigger, MD;  Location: [facility] ENDOSCOPY;  Service:  Endoscopy;  Laterality: N/A;   ESOPHAGOGASTRODUODENOSCOPY N/A 11/22/2021   Procedure: ESOPHAGOGASTRODUODENOSCOPY (EGD);  Surgeon: Lynann Bologna, DO;  Location: [facility] ENDOSCOPY;  Service: Gastroenterology;  Laterality: N/A;   ESOPHAGOGASTRODUODENOSCOPY (EGD) WITH PROPOFOL N/A 12/04/2017   Procedure: ESOPHAGOGASTRODUODENOSCOPY (EGD) WITH PROPOFOL;  Surgeon: Charlott Rakes, MD;  Location: [facility] ENDOSCOPY;  Service:  Endoscopy;  Laterality: N/A;   ESOPHAGOGASTRODUODENOSCOPY (EGD) WITH PROPOFOL N/A 12/24/2020   Procedure: ESOPHAGOGASTRODUODENOSCOPY (EGD) WITH PROPOFOL;  Surgeon: Willis Modena, MD;  Location: [facility] ENDOSCOPY;  Service: Endoscopy;  Laterality: N/A;   ESOPHAGOGASTRODUODENOSCOPY (EGD) WITH PROPOFOL N/A 01/19/2021   Procedure: ESOPHAGOGASTRODUODENOSCOPY (EGD) WITH PROPOFOL;  Surgeon: Charlott Rakes, MD;  Location: [facility] ENDOSCOPY;  Service: Endoscopy;  Laterality: N/A;   ESOPHAGOGASTRODUODENOSCOPY (EGD) WITH PROPOFOL N/A 02/06/2021   Procedure: ESOPHAGOGASTRODUODENOSCOPY (EGD) WITH PROPOFOL;  Surgeon: Kerin Salen, MD;  Location: [facility] ENDOSCOPY;  Service: Gastroenterology;  Laterality: N/A;   ESOPHAGOGASTRODUODENOSCOPY ENDOSCOPY  08/19/2006   with laser treatment   HEMOSTASIS CLIP PLACEMENT  12/24/2020   Procedure: HEMOSTASIS CLIP PLACEMENT;  Surgeon: Willis Modena, MD;  Location: [facility] ENDOSCOPY;  Service: Endoscopy;;   HEMOSTASIS CLIP PLACEMENT  01/19/2021   Procedure: HEMOSTASIS CLIP PLACEMENT;  Surgeon: Charlott Rakes, MD;  Location: [facility] ENDOSCOPY;  Service: Endoscopy;;   HEMOSTASIS CLIP PLACEMENT  11/22/2021   Procedure: HEMOSTASIS CLIP PLACEMENT;  Surgeon: Lynann Bologna, DO;  Location: [name];  Service: Gastroenterology;;   HOT HEMOSTASIS N/A 07/06/2014   Procedure: HOT HEMOSTASIS (ARGON PLASMA COAGULATION/BICAP);  Surgeon: Vida Rigger, MD;  Location: [facility] ENDOSCOPY;  Service: Endoscopy;  Laterality: N/A;   HOT HEMOSTASIS N/A 09/28/2015   Procedure: HOT HEMOSTASIS (ARGON PLASMA COAGULATION/BICAP);  Surgeon: Vida Rigger, MD;  Location: [facility] ENDOSCOPY;  Service: Endoscopy;  Laterality: N/A;   HOT HEMOSTASIS N/A 12/04/2017   Procedure: HOT HEMOSTASIS (ARGON PLASMA COAGULATION/BICAP);  Surgeon: Charlott Rakes, MD;  Location: [facility] ENDOSCOPY;  Service: Endoscopy;  Laterality: N/A;   HOT HEMOSTASIS  11/22/2021   Procedure: HOT HEMOSTASIS (ARGON PLASMA COAGULATION/BICAP);  Surgeon: Lynann Bologna, DO;  Location: [facility] ENDOSCOPY;  Service: Gastroenterology;;   [address] SELECTIVE EACH ADDITIONAL VESSEL  02/05/2021   IR ANGIOGRAM VISCERAL SELECTIVE  02/05/2021   IR EMBO ART  VEN HEMORR LYMPH EXTRAV  INC GUIDE ROADMAPPING  02/05/2021   IR US GUIDE VASC ACCESS RIGHT  02/05/2021   NASAL HEMORRHAGE CONTROL     "for bleeding"    SAVORY DILATION  02/26/2011   Procedure: SAVORY DILATION;  Surgeon: Barrie Folk, MD;  Location: [name];  Service: Endoscopy;  Laterality: N/A;  c-arm needed   SCLEROTHERAPY  12/24/2020   Procedure: SCLEROTHERAPY;  Surgeon: Willis Modena, MD;  Location: [facility] ENDOSCOPY;  Service: Endoscopy;;   SCLEROTHERAPY  01/19/2021   Procedure: Susa Day;  Surgeon: Charlott Rakes, MD;  Location: [facility] ENDOSCOPY;  Service: Endoscopy;;   SUBMUCOSAL INJECTION  12/04/2017   Procedure: SUBMUCOSAL INJECTION;  Surgeon: Charlott Rakes, MD;  Location: [facility] ENDOSCOPY;  Service: Endoscopy;;    Family History  Problem Relation Age of Onset   Healthy Mother    Stroke Father    Diabetes Niece    Breast cancer Other    Malignant hyperthermia Neg Hx    Seizures Neg Hx    Lung cancer Neg Hx     Social History   Occupational History   Occupation: Retired Tax adviser: RETIRED  Tobacco Use   Smoking status: Former    Packs/day: 1.00    Years: 20.00    Additional pack years:  0.00    Total pack years: 20.00    Types: Cigarettes    Quit date: 02/10/1971    Years since quitting: 51.3   Smokeless tobacco: Never   Tobacco comments:    09/15/2012 "smoked 50-60 yr ago"  Vaping Use   Vaping Use: Never used  Substance and Sexual Activity   Alcohol use: No    Alcohol/week: 0.0 standard drinks of alcohol   Drug use: No   Sexual activity: Not on file     Physical Exam: Today's Vitals   06/17/22 0922  BP: 120/68  Pulse: 80  Temp: 98.2 F (36.8 C)  TempSrc: Oral  SpO2: 94%   There is no height or weight on file to calculate BMI.  Gen: elderly,  sitting in wheelchair, somnolent CV: RRR Lungs: breathing nonlabored Ext: bilateral LE edema R>L   Data Reviewed: Imaging: I have personally reviewed the CT Chest July 2023 shows >2cm lung mass concerning for primary lung cancer in the RUL. No associated lymphadenopathy but this is a non-contrasted study.   PFTs:     Latest Ref Rng & Units 06/26/2021   10:27 AM  PFT Results  FVC-Pre L 1.01   FVC-Predicted Pre % 53   Pre FEV1/FVC % % 74   FEV1-Pre L 0.75   FEV1-Predicted Pre % 51    I have personally reviewed the patient's PFTs and no airflow limitation, suggests restriction.   Labs:  Immunization status: Immunization History  Administered Date(s) Administered   Research officer, trade [employer] 67yrs & up 01/22/2021   Pneumococcal Polysaccharide-23 02/10/2003   Td 02/10/2003    External Records Personally Reviewed: heme/onc  Assessment:  Left upper Lobe lung mass concerning for primary lung malignancy.  Severe Dementia, progressing Deconditioning   Plan/Recommendations: I discussed and reviewed plan of care with niece Angelique Blonder and patient who was mostly unable to participate in the encounter. She was inquiring if repeating a CT scan would be useful in prognostication for lifespan. Unfortunately her dementia has progressed and I feel at this point she is likely to pass away from complications from dementia sooner than the lung cancer. She is asymptomatic from pulmonary perspective without cough, shortness of breath. She is having worsening sleep architecture and decreased appetite. She is following now with palliative care ([facility]) and I have given Denies their contact information so she can reach out to them for further concerns.  IT seems they have been primarily communicating with the spouse and Tabitha when it should be Angelique Blonder who brings her to appointments.  I am available for questions or concerns by mychart and telephone if there are further queries.   I  spent 30 minutes in the care of this patient today including pre-charting, chart review, review of results, face-to-face care, coordination of care and communication with consultants etc.).   Return to Care: Return if symptoms worsen or fail to improve.   Durel Salts, MD Pulmonary and Critical Care Medicine [facility] [phone]

## 2022-06-17 NOTE — Patient Instructions (Signed)
Follow up with me as needed. Please send me a message on mychart or a phone call if you have further questions.  Right now I don't think repeating a scan of the lungs would be helpful with prognostication of lifespan.   She is being followed by [facility] - outpatient palliative care.  Telephone: [phone]  Fax: [phone]

## 2022-06-18 ENCOUNTER — Telehealth: Payer: Self-pay | Admitting: Internal Medicine

## 2022-06-18 NOTE — Telephone Encounter (Signed)
"  Make [name] the contact for Hospice."  Her care center says this is on the AVS. I did not see it. Now they want to know if Dr. Is referring PT to hospice.  Nurse @ [facility] and [employer] @ [phone]

## 2022-06-19 NOTE — Telephone Encounter (Signed)
Spoke with [employer]. Advised patient has already been established with palliative care. Dr. Celine Mans was not referring her anywhere. She only provided niece with contact info for there if niece had any questions. Annette verbalized understanding. NFN

## 2022-07-01 ENCOUNTER — Encounter: Payer: Self-pay | Admitting: Hematology

## 2022-07-01 ENCOUNTER — Non-Acute Institutional Stay: Payer: Medicare Other | Admitting: Hospice

## 2022-07-01 DIAGNOSIS — E119 Type 2 diabetes mellitus without complications: Secondary | ICD-10-CM

## 2022-07-01 DIAGNOSIS — I5032 Chronic diastolic (congestive) heart failure: Secondary | ICD-10-CM

## 2022-07-01 DIAGNOSIS — F0390 Unspecified dementia without behavioral disturbance: Secondary | ICD-10-CM

## 2022-07-01 DIAGNOSIS — Z515 Encounter for palliative care: Secondary | ICD-10-CM

## 2022-07-01 NOTE — Progress Notes (Unsigned)
Therapist, nutritional Palliative Care Consult Note Telephone: [phone]  Fax: [phone]  PATIENT NAME: [name] [address] [place] [address] [phone] (home)  DOB: Jun 16, 1940 MRN: [id number]  PRIMARY CARE PROVIDER:    Adron Bene, MD,  [address] [place] [address] [phone]  REFERRING PROVIDER:  Bonnita Nasuti, NP   RESPONSIBLE PARTY:   Spouse [phone] c Contact: Tabitha  God-daughter [phone]  Contact Information     Name Relation Home Work Mobile   Mebanes,Denise Niece   [phone]   Breleigh, Bixler [phone]  [phone]      I met face to face with patient and at facility. Palliative Care was asked to follow this patient by consultation request of  Lowella Petties NP to address advance care planning, complex medical decision making and goals of care clarification.       ASSESSMENT AND / RECOMMENDATIONS:   CODE STATUS: Patient is a Full code  Goals of Care: Goals include to maximize quality of life and symptom management  Patient is content, in no distress.   Will continue to follow, monitor comfort, weight, appetite, progression of chronic disease, symptoms of pain.  Nursing staff updated, no new changes; medical goals,  plan of care and medications reviewed.   Symptom Management/Plan: Dementia:  Continue memantine as ordered.  Memory loss/confusion at baseline.  Encourage reminiscence, word search/puzzles, cueing for recollection.  Promote calm approach and engaging environment.  Safety and fall precautions.  incontinent of bowel and bladder FAST 6D.  Continue ongoing supportive care.  Type 2 DM: Stable/controlled A1c  5.5 Apri '24, 6.09 Jan 2022. Continue Trulicity (Dulaglutide), Insulin as ordered. No concentrated sweets. Diabetic diet, no empty caloric beverages. Check CBG as ordered to help ensure tight control.  Repeat A1c in 3 to 6 months.  CFH: Continue Furosemide  as ordered. Elevate BLE during the day as much as possible to promote circulation. Adhere to Fluid and salt limits. Monitor weight,  shortness of breath, edema, chest pain.   Follow up: Palliative care will continue to follow for complex medical decision making, advance care planning, and clarification of goals. Return 6 weeks or prn. Encouraged to call provider sooner with any concerns.   Family /Caregiver/Community Supports: Patient is in SNF for ongoing care  HOSPICE ELIGIBILITY/DIAGNOSIS: TBD  Chief Complaint: Follow up visit  HISTORY OF PRESENT ILLNESS:  [name] is a 82 y.o. year old female  with multiple morbidities requiring close monitoring/management with high risk of complications and morbidity: Advanced Dementia,Type 2 DM. CHF, Anemia.  Patient denies pain/discomfort; did not have any complaint, denied pain or respiratory distress  FLACC 0.  History obtained from review of EMR, discussion with primary team, caregiver, family and/or Ms. Gartrell.   Review and summarization of Epic records shows history from other than patient. Rest of 10 point ROS asked and negative.  I reviewed as needed, available labs, patient records, imaging, studies and related documents from the EMR.   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   Fatigue 07/10/2011   Type 2 diabetes mellitus ([name] 09/15/2012   Chronic diastolic CHF (congestive heart failure) ([name] 10/03/2013   Essential hypertension 03/02/2012   CKD (chronic kidney disease), stage III ([name]    Simple febrile convulsions ([name] 10/21/2014   Dementia ([name] 01/30/2015   Healthcare maintenance  06/20/2015   Seborrheic keratosis 06/20/2015   PAF (paroxysmal atrial fibrillation) ([name] 09/29/2015   Pain in joint of right shoulder 10/22/2015   Peripheral vascular disease ([name] 12/30/2015   Dry skin 02/17/2016   Venous  insufficiency of both lower extremities 04/27/2016   Epistaxis 07/30/2016   Osteopenia after menopause 01/13/2017   Overgrown toenails 03/18/2017   Pulmonary HTN ([name] 04/01/2017   Heart murmur 04/01/2017   Lower extremity pain, bilateral 07/15/2017   Pain localized to upper abdomen 10/05/2017   Leg weakness, bilateral 12/23/2017   Hyperpigmented skin lesion 02/07/2018   Iron deficiency anemia due to chronic blood loss 04/19/2018   Port-A-Cath in place 05/23/2018   Neuropathy 07/27/2019   Seizures ([name] 09/2014   Hearing loss 05/29/2020   Bullous lesion 10/08/2020   History of GI bleed 01/18/2021   Resolved Ambulatory Problems    Diagnosis Date Noted   Type 2 diabetes mellitus with complication, with long-term current use of insulin ([name] 04/08/2006   HYPERLIPIDEMIA 04/08/2006   Anemia, iron deficiency 04/21/2006   Iron deficiency anemia due to chronic blood loss 04/08/2006   HYPERTENSION, BENIGN SYSTEMIC 04/08/2006   SYMPTOM, EPISTAXIS 05/21/2006   Anemia 02/01/2011   GI bleed 02/01/2011   Hypokalemia 02/01/2011   Obesity 09/15/2012   Abnormal CXR 09/15/2012   Dyspnea 10/03/2013   Acute on chronic heart failure with preserved ejection fraction (HFpEF) ([name] 10/03/2013   Upper GI bleed 07/05/2014   Poor venous access    SOB (shortness of breath) 07/27/2014   Acute kidney injury ([name] 08/27/2014   Acute encephalopathy 08/27/2014   Acute renal failure superimposed on stage 3 chronic kidney disease ([name] 08/27/2014   Anemia of chronic disease 08/27/2014   UTI (lower urinary tract infection) 08/27/2014   Anemia 09/04/2014   Acute lower GI bleeding 09/04/2014   Acute blood loss anemia    Hyperammonemia ([name] 10/03/2014   Status epilepticus ([name] 10/03/2014   Hypotension 11/13/2014   Symptomatic anemia 11/29/2014   Bleeding gastrointestinal    SOB (shortness of breath) 01/18/2015   Lichen planus 07/17/2015   Acute GI bleeding 09/28/2015   Acute on chronic blood loss  anemia    Angiodysplasia of stomach with hemorrhage    AVM (arteriovenous malformation) of stomach, acquired with hemorrhage    Hyperglycemia due to type 2 diabetes mellitus ([name] 05/21/2016   Muscle spasm of both lower legs 06/15/2016   Hematemesis 12/04/2017   Acute right hip pain 03/10/2018   Hyperkalemia 10/18/2018   Hypervolemia due to congestive heart failure ([name] 08/02/2019   Abnormal LFTs 08/09/2019   Impacted cerumen of left ear 07/15/2020   Hypoglycemia 09/29/2020   Acute upper GI bleed 12/23/2020   Hemorrhagic shock ([name]    Altered mental status 12/31/2020   Acute metabolic encephalopathy 01/09/2021   AKI (acute kidney injury) ([name]    Dehydration    AKI (acute kidney injury) ([name] 01/28/2021   Altered mental status 02/03/2021   AMS (altered mental status) 02/04/2021   Acute upper GI bleed 11/21/2021   Past Medical History:  Diagnosis Date   Chronic anemia    Chronic GI bleeding    Family history of anesthesia complication    Frequent nosebleeds    Gastric AV malformation    GERD (gastroesophageal reflux disease)    History of blood transfusion "several"   History of epistaxis    HTN (hypertension), benign 03/02/2012   Hyperlipidemia    Iron deficiency anemia    Osler-Weber-Rendu syndrome ([name]    Pneumonia  1990's X 2   Telangiectasia    Type II diabetes mellitus ([name]     SOCIAL HX:  Social History   Tobacco Use   Smoking status: Former    Packs/day: 1.00    Years: 20.00    Additional pack years: 0.00    Total pack years: 20.00    Types: Cigarettes    Quit date: 02/10/1971    Years since quitting: 51.4   Smokeless tobacco: Never   Tobacco comments:    09/15/2012 "smoked 50-60 yr ago"  Substance Use Topics   Alcohol use: No    Alcohol/week: 0.0 standard drinks of alcohol     FAMILY HX:  Family History  Problem Relation Age of Onset   Healthy Mother    Stroke Father    Diabetes Niece    Breast cancer Other    Malignant hyperthermia Neg Hx     Seizures Neg Hx    Lung cancer Neg Hx       ALLERGIES:  Allergies  Allergen Reactions   Aspirin Nausea And Vomiting      PERTINENT MEDICATIONS:  Outpatient Encounter Medications as of 07/01/2022  Medication Sig   acetaminophen (TYLENOL) 325 MG tablet Take 650 mg by mouth every 4 (four) hours as needed for mild pain.   amLODipine (NORVASC) 5 MG tablet Take 1 tablet (5 mg total) by mouth daily.   ascorbic acid (VITAMIN C) 250 MG tablet Take 250 mg by mouth 2 (two) times daily.   bisacodyl (DULCOLAX) 10 MG suppository Place 10 mg rectally daily as needed (constipation).   ferrous sulfate 325 (65 FE) MG tablet Take 325 mg by mouth 2 (two) times daily.   gabapentin (NEURONTIN) 100 MG capsule Take 1 capsule (100 mg total) by mouth in the morning AND 2 capsules (200 mg total) at bedtime. (Patient taking differently: 100 mg in the morning, 200 mg at bedtime.)   guaiFENesin (ROBITUSSIN) 100 MG/5ML liquid Take 200 mg by mouth every 4 (four) hours as needed for cough. (Patient not taking: Reported on 06/05/2022)   insulin lispro (HUMALOG KWIKPEN) 100 UNIT/ML KwikPen Inject 3 Units into the skin 3 (three) times daily. (Patient taking differently: Inject 4-6 Units into the skin See admin instructions. 3 entries on paperwork from facility: 4 units in the morning for hyperglycemia, hold for BS < 120. 5 units once daily, hold for BS < 120. 6 units once daily, hold for BS < 120.)   lactulose (CHRONULAC) 10 GM/15ML solution Take 10 g by mouth 3 (three) times daily.   LANTUS SOLOSTAR 100 UNIT/ML Solostar Pen Inject 10 Units into the skin at bedtime.   levETIRAcetam (KEPPRA) 500 MG tablet Take 1 tablet (500 mg total) by mouth 2 (two) times daily.   Magnesium Hydroxide (MILK OF MAGNESIA PO) Take 30 mLs by mouth daily.   memantine (NAMENDA) 10 MG tablet TAKE 1 TABLET BY MOUTH TWICE DAILY (Patient taking differently: Take 10 mg by mouth 2 (two) times daily.)   mirtazapine (REMERON) 7.5 MG tablet Take 7.5 mg  by mouth at bedtime.   Multiple Vitamin (MULTIVITAMIN) tablet Take 1 tablet by mouth daily.   omeprazole (PRILOSEC) 40 MG capsule Take 40 mg by mouth 2 (two) times daily.   oxymetazoline (AFRIN) 0.05 % nasal spray Place 2 sprays into both nostrils every 6 (six) hours as needed (nose bleeds). (Patient not taking: Reported on 06/05/2022)   PROTEIN PO Take 30 mLs by mouth daily. (Patient not taking: Reported on 06/05/2022)   torsemide (  DEMADEX) 20 MG tablet Take 1 tablet (20 mg total) by mouth as needed (please take 1 20mg  tablet as needed for weight gain of 2 pounds overnight or 5 pounds in a week). (Patient taking differently: Take 20 mg by mouth daily as needed (weight gain of 2 lbs overnight or 5 lbs in 1 week.).)   triamcinolone ointment (KENALOG) 0.5 % Apply 1 Application topically 2 (two) times daily.   TRULICITY 3 MG/0.5ML SOPN Inject 3 mg into the skin once a week.   Facility-Administered Encounter Medications as of 07/01/2022  Medication   sodium chloride 0.9 % injection 10 mL   sodium chloride flush (NS) 0.9 % injection 10 mL   I spent 35 minutes providing this consultation; time includes spent with patient/family, chart review and documentation. More than 50% of the time in this consultation was spent on care coordination.  Thank you for the opportunity to participate in the care of Ms. Shutes.  The palliative care team will continue to follow. Please call our office at [phone] if we can be of additional assistance.   Note: Portions of this note were generated with Scientist, clinical (histocompatibility and immunogenetics). Dictation errors may occur despite best attempts at proofreading.  Rosaura Carpenter, NP

## 2022-07-29 ENCOUNTER — Ambulatory Visit: Payer: Medicare Other | Admitting: Endocrinology

## 2022-07-31 ENCOUNTER — Non-Acute Institutional Stay: Payer: Medicare (Managed Care) | Admitting: Hospice

## 2022-07-31 DIAGNOSIS — F0390 Unspecified dementia without behavioral disturbance: Secondary | ICD-10-CM

## 2022-07-31 DIAGNOSIS — E119 Type 2 diabetes mellitus without complications: Secondary | ICD-10-CM

## 2022-07-31 DIAGNOSIS — Z515 Encounter for palliative care: Secondary | ICD-10-CM

## 2022-07-31 DIAGNOSIS — I5032 Chronic diastolic (congestive) heart failure: Secondary | ICD-10-CM

## 2022-07-31 NOTE — Progress Notes (Signed)
Therapist, nutritional Palliative Care Consult Note Telephone: [phone]  Fax: [phone]  PATIENT NAME: [name] [address] [place] [address] [phone] (home)  DOB: [birth year] MRN: [id number]  PRIMARY CARE PROVIDER:    Adron Bene, MD,  [address] [place] [address] [phone]  REFERRING PROVIDER:  Bonnita Nasuti, NP   RESPONSIBLE PARTY:   Spouse [phone] c Contact: Tabitha  God-daughter [phone]  Contact Information     Name Relation Home Work Mobile   Mebanes,Denise Niece   [phone]   Jonel, Sick [phone]  [phone]      I met face to face with patient and at facility. Palliative Care was asked to follow this patient by consultation request of  Lowella Petties NP to address advance care planning, complex medical decision making and goals of care clarification.       ASSESSMENT AND / RECOMMENDATIONS:   CODE STATUS: Patient is a Full code  Goals of Care: Goals include to maximize quality of life and symptom management. Symptom Management/Plan: Dementia:  Progressive memory loss/confusion. Continue memantine. Incontinent of bowel and bladder FAST 6D.   Encourage reminiscence, word search/puzzles, cueing for recollection.  Promote calm approach and engaging environment.   Safety and fall precautions.  incontinent of bowel and bladder FAST 6D.  Continue ongoing supportive care.  Type 2 DM: Stable/controlled A1c  5.5 Apri '24, 6.09 Jan 2022. Continue Trulicity (Dulaglutide), Insulin as ordered. No concentrated sweets. Diabetic diet, no empty caloric beverages. Check CBG as ordered to help ensure tight control.  Repeat A1c in 3 to 6 months.  CHF: Report of blister to left inner foot and right side of face; new order for Lasix and labs - CBC BNP CMP. Elevate BLE during the day as much as possible to promote circulation. Adhere to Fluid and salt limits. Monitor weight daily x  7; monitor for shortness of breath, edema, chest pain.  07/29/2022 133.6 Lbs   07/12/2022 130.8 Lbs  06/12/2022 130.6 Lbs  05/12/2022 126.4 Lbs  Follow up: Palliative care will continue to follow for complex medical decision making, advance care planning, and clarification of goals. Return 6 weeks or prn. Encouraged to call provider sooner with any concerns.   Family /Caregiver/Community Supports: Patient is in SNF for ongoing care  HOSPICE ELIGIBILITY/DIAGNOSIS: TBD  Chief Complaint: Follow up visit  HISTORY OF PRESENT ILLNESS:  [name] is a 82 y.o. year old female  with multiple morbidities requiring close monitoring/management with high risk of complications and morbidity: Advanced Dementia,Type 2 DM. CHF, Anemia.  Patient  sleeping in bed during visit, easily arousable, denied pain/discomfort, did not want to be bothered. History obtained from review of EMR, discussion with primary team, caregiver, family and/or [name].   Review and summarization of Epic records shows history from other than patient. Rest of 10 point ROS asked and negative.  I reviewed as needed, available labs, patient records, imaging, studies and related documents from the EMR.   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   Fatigue 07/10/2011   Type 2 diabetes mellitus ([name] 09/15/2012   Chronic diastolic CHF (congestive heart failure) ([name] 10/03/2013   Essential hypertension 03/02/2012   CKD (chronic kidney disease), stage III ([name]    Simple febrile convulsions ([name] 10/21/2014   Dementia ([name] 01/30/2015   Healthcare  maintenance 06/20/2015   Seborrheic keratosis 06/20/2015   PAF (paroxysmal atrial fibrillation) ([name] 09/29/2015   Pain in joint of right shoulder 10/22/2015   Peripheral vascular disease ([name] 12/30/2015   Dry skin 02/17/2016   Venous insufficiency of both lower  extremities 04/27/2016   Epistaxis 07/30/2016   Osteopenia after menopause 01/13/2017   Overgrown toenails 03/18/2017   Pulmonary HTN ([name] 04/01/2017   Heart murmur 04/01/2017   Lower extremity pain, bilateral 07/15/2017   Pain localized to upper abdomen 10/05/2017   Leg weakness, bilateral 12/23/2017   Hyperpigmented skin lesion 02/07/2018   Iron deficiency anemia due to chronic blood loss 04/19/2018   Port-A-Cath in place 05/23/2018   Neuropathy 07/27/2019   Seizures ([name] 09/2014   Hearing loss 05/29/2020   Bullous lesion 10/08/2020   History of GI bleed 01/18/2021   Resolved Ambulatory Problems    Diagnosis Date Noted   Type 2 diabetes mellitus with complication, with long-term current use of insulin ([name] 04/08/2006   HYPERLIPIDEMIA 04/08/2006   Anemia, iron deficiency 04/21/2006   Iron deficiency anemia due to chronic blood loss 04/08/2006   HYPERTENSION, BENIGN SYSTEMIC 04/08/2006   SYMPTOM, EPISTAXIS 05/21/2006   Anemia 02/01/2011   GI bleed 02/01/2011   Hypokalemia 02/01/2011   Obesity 09/15/2012   Abnormal CXR 09/15/2012   Dyspnea 10/03/2013   Acute on chronic heart failure with preserved ejection fraction (HFpEF) ([name] 10/03/2013   Upper GI bleed 07/05/2014   Poor venous access    SOB (shortness of breath) 07/27/2014   Acute kidney injury ([name] 08/27/2014   Acute encephalopathy 08/27/2014   Acute renal failure superimposed on stage 3 chronic kidney disease ([name] 08/27/2014   Anemia of chronic disease 08/27/2014   UTI (lower urinary tract infection) 08/27/2014   Anemia 09/04/2014   Acute lower GI bleeding 09/04/2014   Acute blood loss anemia    Hyperammonemia ([name] 10/03/2014   Status epilepticus ([name] 10/03/2014   Hypotension 11/13/2014   Symptomatic anemia 11/29/2014   Bleeding gastrointestinal    SOB (shortness of breath) 01/18/2015   Lichen planus 07/17/2015   Acute GI bleeding 09/28/2015   Acute on chronic blood loss anemia    Angiodysplasia of  stomach with hemorrhage    AVM (arteriovenous malformation) of stomach, acquired with hemorrhage    Hyperglycemia due to type 2 diabetes mellitus ([name] 05/21/2016   Muscle spasm of both lower legs 06/15/2016   Hematemesis 12/04/2017   Acute right hip pain 03/10/2018   Hyperkalemia 10/18/2018   Hypervolemia due to congestive heart failure ([name] 08/02/2019   Abnormal LFTs 08/09/2019   Impacted cerumen of left ear 07/15/2020   Hypoglycemia 09/29/2020   Acute upper GI bleed 12/23/2020   Hemorrhagic shock ([name]    Altered mental status 12/31/2020   Acute metabolic encephalopathy 01/09/2021   AKI (acute kidney injury) ([name]    Dehydration    AKI (acute kidney injury) ([name] 01/28/2021   Altered mental status 02/03/2021   AMS (altered mental status) 02/04/2021   Acute upper GI bleed 11/21/2021   Past Medical History:  Diagnosis Date   Chronic anemia    Chronic GI bleeding    Family history of anesthesia complication    Frequent nosebleeds    Gastric AV malformation    GERD (gastroesophageal reflux disease)    History of blood transfusion "several"   History of epistaxis    HTN (hypertension), benign 03/02/2012   Hyperlipidemia    Iron deficiency anemia    Osler-Weber-Rendu syndrome ([name]  Pneumonia 1990's X 2   Telangiectasia    Type II diabetes mellitus ([name]     SOCIAL HX:  Social History   Tobacco Use   Smoking status: Former    Packs/day: 1.00    Years: 20.00    Additional pack years: 0.00    Total pack years: 20.00    Types: Cigarettes    Quit date: 02/10/1971    Years since quitting: 51.5   Smokeless tobacco: Never   Tobacco comments:    09/15/2012 "smoked 50-60 yr ago"  Substance Use Topics   Alcohol use: No    Alcohol/week: 0.0 standard drinks of alcohol     FAMILY HX:  Family History  Problem Relation Age of Onset   Healthy Mother    Stroke Father    Diabetes Niece    Breast cancer Other    Malignant hyperthermia Neg Hx    Seizures Neg Hx    Lung  cancer Neg Hx       ALLERGIES:  Allergies  Allergen Reactions   Aspirin Nausea And Vomiting      PERTINENT MEDICATIONS:  Outpatient Encounter Medications as of 07/31/2022  Medication Sig   acetaminophen (TYLENOL) 325 MG tablet Take 650 mg by mouth every 4 (four) hours as needed for mild pain.   amLODipine (NORVASC) 5 MG tablet Take 1 tablet (5 mg total) by mouth daily.   ascorbic acid (VITAMIN C) 250 MG tablet Take 250 mg by mouth 2 (two) times daily.   bisacodyl (DULCOLAX) 10 MG suppository Place 10 mg rectally daily as needed (constipation).   ferrous sulfate 325 (65 FE) MG tablet Take 325 mg by mouth 2 (two) times daily.   gabapentin (NEURONTIN) 100 MG capsule Take 1 capsule (100 mg total) by mouth in the morning AND 2 capsules (200 mg total) at bedtime. (Patient taking differently: 100 mg in the morning, 200 mg at bedtime.)   guaiFENesin (ROBITUSSIN) 100 MG/5ML liquid Take 200 mg by mouth every 4 (four) hours as needed for cough. (Patient not taking: Reported on 06/05/2022)   insulin lispro (HUMALOG KWIKPEN) 100 UNIT/ML KwikPen Inject 3 Units into the skin 3 (three) times daily. (Patient taking differently: Inject 4-6 Units into the skin See admin instructions. 3 entries on paperwork from facility: 4 units in the morning for hyperglycemia, hold for BS < 120. 5 units once daily, hold for BS < 120. 6 units once daily, hold for BS < 120.)   lactulose (CHRONULAC) 10 GM/15ML solution Take 10 g by mouth 3 (three) times daily.   LANTUS SOLOSTAR 100 UNIT/ML Solostar Pen Inject 10 Units into the skin at bedtime.   levETIRAcetam (KEPPRA) 500 MG tablet Take 1 tablet (500 mg total) by mouth 2 (two) times daily.   Magnesium Hydroxide (MILK OF MAGNESIA PO) Take 30 mLs by mouth daily.   memantine (NAMENDA) 10 MG tablet TAKE 1 TABLET BY MOUTH TWICE DAILY (Patient taking differently: Take 10 mg by mouth 2 (two) times daily.)   mirtazapine (REMERON) 7.5 MG tablet Take 7.5 mg by mouth at bedtime.    Multiple Vitamin (MULTIVITAMIN) tablet Take 1 tablet by mouth daily.   omeprazole (PRILOSEC) 40 MG capsule Take 40 mg by mouth 2 (two) times daily.   oxymetazoline (AFRIN) 0.05 % nasal spray Place 2 sprays into both nostrils every 6 (six) hours as needed (nose bleeds). (Patient not taking: Reported on 06/05/2022)   PROTEIN PO Take 30 mLs by mouth daily. (Patient not taking: Reported on 06/05/2022)  torsemide (DEMADEX) 20 MG tablet Take 1 tablet (20 mg total) by mouth as needed (please take 1 20mg  tablet as needed for weight gain of 2 pounds overnight or 5 pounds in a week). (Patient taking differently: Take 20 mg by mouth daily as needed (weight gain of 2 lbs overnight or 5 lbs in 1 week.).)   triamcinolone ointment (KENALOG) 0.5 % Apply 1 Application topically 2 (two) times daily.   TRULICITY 3 MG/0.5ML SOPN Inject 3 mg into the skin once a week.   Facility-Administered Encounter Medications as of 07/31/2022  Medication   sodium chloride 0.9 % injection 10 mL   sodium chloride flush (NS) 0.9 % injection 10 mL   I spent 35 minutes providing this consultation; time includes spent with patient/family, chart review and documentation. More than 50% of the time in this consultation was spent on care coordination.  Thank you for the opportunity to participate in the care of [name].  The palliative care team will continue to follow. Please call our office at [phone] if we can be of additional assistance.   Note: Portions of this note were generated with Scientist, clinical (histocompatibility and immunogenetics). Dictation errors may occur despite best attempts at proofreading.  Rosaura Carpenter, NP

## 2022-08-04 ENCOUNTER — Ambulatory Visit: Payer: Medicare Other | Admitting: Endocrinology

## 2022-10-02 ENCOUNTER — Other Ambulatory Visit: Payer: Self-pay

## 2022-10-02 DIAGNOSIS — D500 Iron deficiency anemia secondary to blood loss (chronic): Secondary | ICD-10-CM

## 2022-10-02 DIAGNOSIS — I780 Hereditary hemorrhagic telangiectasia: Secondary | ICD-10-CM

## 2022-10-03 ENCOUNTER — Other Ambulatory Visit: Payer: Self-pay | Admitting: Hematology

## 2022-10-03 ENCOUNTER — Encounter: Payer: Self-pay | Admitting: Hematology

## 2022-10-03 DIAGNOSIS — I780 Hereditary hemorrhagic telangiectasia: Secondary | ICD-10-CM

## 2022-10-04 ENCOUNTER — Other Ambulatory Visit: Payer: Self-pay

## 2022-10-05 ENCOUNTER — Telehealth: Payer: Self-pay | Admitting: Hematology

## 2022-10-05 ENCOUNTER — Inpatient Hospital Stay: Payer: Medicare (Managed Care) | Admitting: Adult Health

## 2022-10-05 ENCOUNTER — Inpatient Hospital Stay: Payer: Medicare (Managed Care)

## 2022-10-05 ENCOUNTER — Telehealth: Payer: Self-pay | Admitting: *Deleted

## 2022-10-05 NOTE — Telephone Encounter (Signed)
Spoke with niece  Derrek Gu and was informed that pt is now in nursing home on [address].   Per niece,  pt did not want to get up, did not want to turn,  very hard for pt to sit up even with assistance.  Pt is  in more pain with every movement,  just wants to sleep. Denise asked if pt's PAC needs to be flushed.   Informed her that since pt has nothing going through her port,  and very hard to get pt up to come in for port flush,  pt does not need to flush it.    Angelique Blonder understood if pt's condition changes -  pt more alert and able to sit up or move around,  she can contact office back for f/u appt.

## 2022-10-16 ENCOUNTER — Other Ambulatory Visit: Payer: Self-pay

## 2022-11-30 ENCOUNTER — Other Ambulatory Visit: Payer: Self-pay

## 2022-11-30 ENCOUNTER — Emergency Department (HOSPITAL_COMMUNITY)

## 2022-11-30 ENCOUNTER — Observation Stay (HOSPITAL_COMMUNITY)

## 2022-11-30 ENCOUNTER — Observation Stay (HOSPITAL_COMMUNITY)
Admission: EM | Admit: 2022-11-30 | Discharge: 2022-12-02 | Disposition: A | Discharge status: Skilled Nursing Facility | Attending: Internal Medicine | Admitting: Internal Medicine

## 2022-11-30 ENCOUNTER — Encounter (HOSPITAL_COMMUNITY): Payer: Self-pay

## 2022-11-30 DIAGNOSIS — N1831 Chronic kidney disease, stage 3a: Secondary | ICD-10-CM | POA: Diagnosis not present

## 2022-11-30 DIAGNOSIS — E1122 Type 2 diabetes mellitus with diabetic chronic kidney disease: Secondary | ICD-10-CM | POA: Insufficient documentation

## 2022-11-30 DIAGNOSIS — I130 Hypertensive heart and chronic kidney disease with heart failure and stage 1 through stage 4 chronic kidney disease, or unspecified chronic kidney disease: Secondary | ICD-10-CM | POA: Diagnosis not present

## 2022-11-30 DIAGNOSIS — R1310 Dysphagia, unspecified: Secondary | ICD-10-CM | POA: Diagnosis not present

## 2022-11-30 DIAGNOSIS — Z85118 Personal history of other malignant neoplasm of bronchus and lung: Secondary | ICD-10-CM | POA: Diagnosis not present

## 2022-11-30 DIAGNOSIS — Z1152 Encounter for screening for COVID-19: Secondary | ICD-10-CM | POA: Diagnosis not present

## 2022-11-30 DIAGNOSIS — Z794 Long term (current) use of insulin: Secondary | ICD-10-CM | POA: Insufficient documentation

## 2022-11-30 DIAGNOSIS — N183 Chronic kidney disease, stage 3 unspecified: Secondary | ICD-10-CM | POA: Diagnosis present

## 2022-11-30 DIAGNOSIS — R4182 Altered mental status, unspecified: Secondary | ICD-10-CM | POA: Diagnosis present

## 2022-11-30 DIAGNOSIS — Z79899 Other long term (current) drug therapy: Secondary | ICD-10-CM | POA: Insufficient documentation

## 2022-11-30 DIAGNOSIS — D500 Iron deficiency anemia secondary to blood loss (chronic): Secondary | ICD-10-CM | POA: Insufficient documentation

## 2022-11-30 DIAGNOSIS — F0390 Unspecified dementia without behavioral disturbance: Secondary | ICD-10-CM | POA: Diagnosis not present

## 2022-11-30 DIAGNOSIS — I5032 Chronic diastolic (congestive) heart failure: Secondary | ICD-10-CM | POA: Insufficient documentation

## 2022-11-30 DIAGNOSIS — E119 Type 2 diabetes mellitus without complications: Secondary | ICD-10-CM

## 2022-11-30 DIAGNOSIS — D72829 Elevated white blood cell count, unspecified: Secondary | ICD-10-CM | POA: Insufficient documentation

## 2022-11-30 DIAGNOSIS — I480 Paroxysmal atrial fibrillation: Secondary | ICD-10-CM | POA: Diagnosis not present

## 2022-11-30 DIAGNOSIS — R569 Unspecified convulsions: Secondary | ICD-10-CM

## 2022-11-30 DIAGNOSIS — I10 Essential (primary) hypertension: Secondary | ICD-10-CM | POA: Diagnosis present

## 2022-11-30 DIAGNOSIS — Z87891 Personal history of nicotine dependence: Secondary | ICD-10-CM | POA: Insufficient documentation

## 2022-11-30 LAB — COMPREHENSIVE METABOLIC PANEL
ALT: 21 U/L (ref 0–44)
AST: 32 U/L (ref 15–41)
Albumin: <1.5 g/dL — ABNORMAL LOW (ref 3.5–5.0)
Alkaline Phosphatase: 178 U/L — ABNORMAL HIGH (ref 38–126)
Anion gap: 9 (ref 5–15)
BUN: 20 mg/dL (ref 8–23)
CO2: 31 mmol/L (ref 22–32)
Calcium: 8 mg/dL — ABNORMAL LOW (ref 8.9–10.3)
Chloride: 111 mmol/L (ref 98–111)
Creatinine, Ser: 1.22 mg/dL — ABNORMAL HIGH (ref 0.44–1.00)
GFR, Estimated: 44 mL/min — ABNORMAL LOW (ref >60)
Glucose, Bld: 69 mg/dL — ABNORMAL LOW (ref 70–99)
Potassium: 3.8 mmol/L (ref 3.5–5.1)
Sodium: 151 mmol/L — ABNORMAL HIGH (ref 135–145)
Total Bilirubin: 1.5 mg/dL — ABNORMAL HIGH (ref 0.3–1.2)
Total Protein: 4.9 g/dL — ABNORMAL LOW (ref 6.5–8.1)

## 2022-11-30 LAB — URINALYSIS, ROUTINE W REFLEX MICROSCOPIC
Bilirubin Urine: NEGATIVE
Glucose, UA: NEGATIVE mg/dL
Hgb urine dipstick: NEGATIVE
Ketones, ur: NEGATIVE mg/dL
Leukocytes,Ua: NEGATIVE
Nitrite: NEGATIVE
Protein, ur: 30 mg/dL — AB
Specific Gravity, Urine: 1.02 (ref 1.005–1.030)
pH: 5 (ref 5.0–8.0)

## 2022-11-30 LAB — CBC WITH DIFFERENTIAL/PLATELET
Abs Immature Granulocytes: 0.09 10*3/uL — ABNORMAL HIGH (ref 0.00–0.07)
Basophils Absolute: 0 10*3/uL (ref 0.0–0.1)
Basophils Relative: 0 %
Eosinophils Absolute: 0 10*3/uL (ref 0.0–0.5)
Eosinophils Relative: 0 %
HCT: 27.4 % — ABNORMAL LOW (ref 36.0–46.0)
Hemoglobin: 8.2 g/dL — ABNORMAL LOW (ref 12.0–15.0)
Immature Granulocytes: 1 %
Lymphocytes Relative: 9 %
Lymphs Abs: 1 10*3/uL (ref 0.7–4.0)
MCH: 30.1 pg (ref 26.0–34.0)
MCHC: 29.9 g/dL — ABNORMAL LOW (ref 30.0–36.0)
MCV: 100.7 fL — ABNORMAL HIGH (ref 80.0–100.0)
Monocytes Absolute: 1.1 10*3/uL — ABNORMAL HIGH (ref 0.1–1.0)
Monocytes Relative: 9 %
Neutro Abs: 9.4 10*3/uL — ABNORMAL HIGH (ref 1.7–7.7)
Neutrophils Relative %: 81 %
Platelets: 190 10*3/uL (ref 150–400)
RBC: 2.72 MIL/uL — ABNORMAL LOW (ref 3.87–5.11)
RDW: 16.5 % — ABNORMAL HIGH (ref 11.5–15.5)
WBC: 11.6 10*3/uL — ABNORMAL HIGH (ref 4.0–10.5)
nRBC: 0 % (ref 0.0–0.2)

## 2022-11-30 LAB — I-STAT CHEM 8, ED
BUN: 23 mg/dL (ref 8–23)
Calcium, Ion: 1.08 mmol/L — ABNORMAL LOW (ref 1.15–1.40)
Chloride: 108 mmol/L (ref 98–111)
Creatinine, Ser: 1.3 mg/dL — ABNORMAL HIGH (ref 0.44–1.00)
Glucose, Bld: 52 mg/dL — ABNORMAL LOW (ref 70–99)
HCT: 26 % — ABNORMAL LOW (ref 36.0–46.0)
Hemoglobin: 8.8 g/dL — ABNORMAL LOW (ref 12.0–15.0)
Potassium: 3.8 mmol/L (ref 3.5–5.1)
Sodium: 149 mmol/L — ABNORMAL HIGH (ref 135–145)
TCO2: 29 mmol/L (ref 22–32)

## 2022-11-30 LAB — I-STAT VENOUS BLOOD GAS, ED
Acid-Base Excess: 9 mmol/L — ABNORMAL HIGH (ref 0.0–2.0)
Bicarbonate: 32.3 mmol/L — ABNORMAL HIGH (ref 20.0–28.0)
Calcium, Ion: 1.08 mmol/L — ABNORMAL LOW (ref 1.15–1.40)
HCT: 26 % — ABNORMAL LOW (ref 36.0–46.0)
Hemoglobin: 8.8 g/dL — ABNORMAL LOW (ref 12.0–15.0)
O2 Saturation: 88 %
Potassium: 3.8 mmol/L (ref 3.5–5.1)
Sodium: 149 mmol/L — ABNORMAL HIGH (ref 135–145)
TCO2: 33 mmol/L — ABNORMAL HIGH (ref 22–32)
pCO2, Ven: 38.2 mm[Hg] — ABNORMAL LOW (ref 44–60)
pH, Ven: 7.535 — ABNORMAL HIGH (ref 7.25–7.43)
pO2, Ven: 48 mm[Hg] — ABNORMAL HIGH (ref 32–45)

## 2022-11-30 LAB — VITAMIN B12: Vitamin B-12: 1246 pg/mL — ABNORMAL HIGH (ref 180–914)

## 2022-11-30 LAB — CBG MONITORING, ED
Glucose-Capillary: 32 mg/dL — CL (ref 70–99)
Glucose-Capillary: 119 mg/dL — ABNORMAL HIGH (ref 70–99)
Glucose-Capillary: 100 mg/dL — ABNORMAL HIGH (ref 70–99)

## 2022-11-30 LAB — RESP PANEL BY RT-PCR (RSV, FLU A&B, COVID)  RVPGX2
Influenza A by PCR: NEGATIVE
Influenza B by PCR: NEGATIVE
Resp Syncytial Virus by PCR: NEGATIVE
SARS Coronavirus 2 by RT PCR: NEGATIVE

## 2022-11-30 LAB — TSH: TSH: 0.991 u[IU]/mL (ref 0.350–4.500)

## 2022-11-30 MED ORDER — BOOST / RESOURCE BREEZE PO LIQD CUSTOM
1.0000 | Freq: Three times a day (TID) | ORAL | Status: DC
  Filled 2022-11-30: qty 1

## 2022-11-30 MED ORDER — LEVETIRACETAM IN NACL 1500 MG/100ML IV SOLN
1500.0000 mg | Freq: Once | INTRAVENOUS | Status: AC
  Administered 2022-11-30: 1500 mg via INTRAVENOUS
  Filled 2022-11-30: qty 100

## 2022-11-30 MED ORDER — LACTATED RINGERS IV BOLUS
500.0000 mL | Freq: Once | INTRAVENOUS | Status: AC
Start: 2022-11-30 — End: 2022-11-30
  Administered 2022-11-30: 500 mL via INTRAVENOUS

## 2022-11-30 MED ORDER — ATORVASTATIN CALCIUM 10 MG PO TABS: 10.0000 mg | ORAL_TABLET | Freq: Every day | ORAL | Status: DC

## 2022-11-30 MED ORDER — MORPHINE SULFATE 10 MG/5ML PO SOLN: 5.0000 mg | Freq: Four times a day (QID) | ORAL | Status: DC | PRN

## 2022-11-30 MED ORDER — FERROUS SULFATE 325 (65 FE) MG PO TABS: 325.0000 mg | ORAL_TABLET | Freq: Two times a day (BID) | ORAL | Status: DC

## 2022-11-30 MED ORDER — DEXTROSE 50 % IV SOLN
50.0000 mL | Freq: Once | INTRAVENOUS | Status: AC
  Administered 2022-11-30: 50 mL via INTRAVENOUS

## 2022-11-30 MED ORDER — HEPARIN SODIUM (PORCINE) 5000 UNIT/ML IJ SOLN
5000.0000 [IU] | Freq: Three times a day (TID) | INTRAMUSCULAR | Status: DC
  Administered 2022-11-30 – 2022-12-02 (×5): 5000 [IU] via SUBCUTANEOUS
  Filled 2022-11-30 (×5): qty 1

## 2022-11-30 MED ORDER — PANTOPRAZOLE SODIUM 40 MG PO TBEC
40.0000 mg | DELAYED_RELEASE_TABLET | Freq: Every day | ORAL | Status: DC
Start: 2022-11-30 — End: 2022-12-02

## 2022-11-30 MED ORDER — MORPHINE SULFATE (CONCENTRATE) 20 MG/ML PO SOLN: 5.0000 mg | Freq: Four times a day (QID) | ORAL | Status: DC | PRN

## 2022-11-30 MED ORDER — MORPHINE SULFATE (PF) 2 MG/ML IV SOLN
2.0000 mg | INTRAVENOUS | Status: DC | PRN
  Administered 2022-11-30: 2 mg via INTRAVENOUS
  Filled 2022-11-30: qty 1

## 2022-11-30 MED ORDER — DEXTROSE 50 % IV SOLN
INTRAVENOUS | Status: AC
  Filled 2022-11-30: qty 50

## 2022-11-30 MED ORDER — DEXTROSE 5 % IV SOLN: INTRAVENOUS | Status: DC

## 2022-11-30 MED ORDER — POLYETHYLENE GLYCOL 3350 17 G PO PACK: 17.0000 g | PACK | Freq: Every day | ORAL | Status: DC | PRN

## 2022-11-30 MED ORDER — ACETAMINOPHEN 325 MG PO TABS: 650.0000 mg | ORAL_TABLET | ORAL | Status: DC | PRN

## 2022-11-30 MED ORDER — MORPHINE SULFATE (PF) 2 MG/ML IV SOLN
INTRAVENOUS | Status: AC
  Administered 2022-11-30: 2 mg via INTRAVENOUS
  Filled 2022-11-30: qty 1

## 2022-11-30 MED ORDER — LEVETIRACETAM 500 MG PO TABS: 500.0000 mg | ORAL_TABLET | Freq: Two times a day (BID) | ORAL | Status: DC

## 2022-11-30 NOTE — ED Triage Notes (Signed)
Pt BIB [facility] from [facility] with R sided gaze and dysphagia x 3 days. She has not had her home medications in 3 days d/t dysphagia symptoms. Mepilex dressing present on R elbow.

## 2022-11-30 NOTE — Progress Notes (Signed)
EEG complete - results pending 

## 2022-11-30 NOTE — ED Notes (Signed)
Patient transported to MRI with family and transport. Patient given IV pain medication prior to movement to MRI.

## 2022-11-30 NOTE — H&P (Addendum)
History and Physical    [name] ZOX:[id number] DOB: [birth year] DOA: 11/30/2022  PCP: Laretta Bolster, MD   Patient coming from: SNF    Chief Complaint: Increased confusion, poor oral intake for 3 days  HPI: [name] is a 82 y.o. female with medical history significant of advanced dementia,lung cancer, type 2 diabetes, diastolic CHF, hypertension, hyperlipidemia, seizure disorder who was brought to the emergency room for complaint of increased confusion from baseline.  As per the report, she was having 3-day history of increased confusion, difficulty in swallowing, right-sided gaze deviation.  As per the report, she has significant decline over the last few months.  She has been enrolled with outpatient hospice.  But she is still full code.  Her husband is the power of attorney.  She has been noticed to be talking less, had poor oral intake.  Patient has weakness on the right side at baseline which is also progressed.  She is nonambulatory at baseline.  She sleeps most of the time. Patient seen and examined at bedside in the emergency department.  During my eval, she was lying in bed, confused, eyes closed.  overall appeared comfortable.  Still was on 2 L of oxygen per minute. Her two nieces who are at bedside and I had long discussion with.  There was no report of fever, chills, chest pain, abdominal pain, nausea, vomiting or diarrhea. We also talked to her husband on phone.  Decision made to change her CODE STATUS to full code.  We will do some workup for her metabolic encephalopathy but we will not be aggressive.  Palliative  care will be involved.   ED Course: On presentation, she was hemodynamically stable.  She is confused at baseline.  Patient cannot provide any additional information.  Patient was requested for admission for evaluation for possible stroke/seizure.  Neurology consulted. Lab work showed sodium of 151 ,creatinine of 1.2, WC count of 11.6, hemoglobin 8.2.  TSH  normal.  Glucose was noticed to be low, in the range of 50-60.  Influenza/flu/COVID-negative.  UA was not suspicious for UTI.  Chest x-ray showed small to moderate left-sided pleural effusion.  CT head did not show any acute intracranial findings.  EEG pending.  MRI ordered    review of Systems: As per HPI otherwise 10 point review of systems negative.    Past Medical History:  Diagnosis Date   Chronic anemia    Chronic diastolic CHF (congestive heart failure) ([name] 10/03/2013   Chronic GI bleeding    /notes 11/29/2014   Family history of anesthesia complication    "niece has a hard time coming out" (09/15/2012)   Frequent nosebleeds    chronic   Gastric AV malformation    /notes 11/29/2014   GERD (gastroesophageal reflux disease)    Heart murmur 04/01/2017   Moderate AVSC on echo 09/2016   Hematemesis 12/04/2017   History of blood transfusion "several"   History of epistaxis    HTN (hypertension), benign 03/02/2012   Hyperlipidemia    Iron deficiency anemia    chronic infusions"   Lichen planus    Both lower extremities   Osler-Weber-Rendu syndrome ([name]    Hattie Perch 11/29/2014   Overgrown toenails 03/18/2017   Pneumonia 1990's X 2   Pulmonary HTN ([name] 04/01/2017   PASP on echo 09/2016 and by echo 2019   Seizures ([name] 09/2014   Symptomatic anemia 11/29/2014   Telangiectasia    Gastric    Type II diabetes mellitus (  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    Past Surgical History:  Procedure Laterality Date   CATARACT EXTRACTION     "I think it was just one eye"   ESOPHAGOGASTRODUODENOSCOPY  02/26/2011   Procedure: ESOPHAGOGASTRODUODENOSCOPY (EGD);  Surgeon: Barrie Folk, MD;  Location: [facility] ENDOSCOPY;  Service: Endoscopy;  Laterality: N/A;   ESOPHAGOGASTRODUODENOSCOPY N/A 11/08/2012   Procedure: ESOPHAGOGASTRODUODENOSCOPY (EGD);  Surgeon: Theda Belfast, MD;  Location: [facility] ENDOSCOPY;  Service: Endoscopy;  Laterality: N/A;   ESOPHAGOGASTRODUODENOSCOPY N/A 10/04/2013   Procedure:  ESOPHAGOGASTRODUODENOSCOPY (EGD);  Surgeon: Vertell Novak., MD;  Location: [facility] ENDOSCOPY;  Service: Endoscopy;  Laterality: N/A;  with APC on stand-by   ESOPHAGOGASTRODUODENOSCOPY N/A 07/06/2014   Procedure: ESOPHAGOGASTRODUODENOSCOPY (EGD);  Surgeon: Vida Rigger, MD;  Location: [facility] ENDOSCOPY;  Service: Endoscopy;  Laterality: N/A;   ESOPHAGOGASTRODUODENOSCOPY N/A 09/05/2014   Procedure: ESOPHAGOGASTRODUODENOSCOPY (EGD);  Surgeon: Carman Ching, MD;  Location: [facility] ENDOSCOPY;  Service: Endoscopy;  Laterality: N/A;  APC on standby to control bleeding   ESOPHAGOGASTRODUODENOSCOPY N/A 11/29/2014   Procedure: ESOPHAGOGASTRODUODENOSCOPY (EGD);  Surgeon: Charlott Rakes, MD;  Location: [facility] ENDOSCOPY;  Service: Endoscopy;  Laterality: N/A;   ESOPHAGOGASTRODUODENOSCOPY N/A 09/28/2015   Procedure: ESOPHAGOGASTRODUODENOSCOPY (EGD);  Surgeon: Vida Rigger, MD;  Location: [facility] ENDOSCOPY;  Service: Endoscopy;  Laterality: N/A;   ESOPHAGOGASTRODUODENOSCOPY N/A 11/22/2021   Procedure: ESOPHAGOGASTRODUODENOSCOPY (EGD);  Surgeon: Lynann Bologna, DO;  Location: [facility] ENDOSCOPY;  Service: Gastroenterology;  Laterality: N/A;   ESOPHAGOGASTRODUODENOSCOPY (EGD) WITH PROPOFOL N/A 12/04/2017   Procedure: ESOPHAGOGASTRODUODENOSCOPY (EGD) WITH PROPOFOL;  Surgeon: Charlott Rakes, MD;  Location: [facility] ENDOSCOPY;  Service: Endoscopy;  Laterality: N/A;   ESOPHAGOGASTRODUODENOSCOPY (EGD) WITH PROPOFOL N/A 12/24/2020   Procedure: ESOPHAGOGASTRODUODENOSCOPY (EGD) WITH PROPOFOL;  Surgeon: Willis Modena, MD;  Location: [facility] ENDOSCOPY;  Service: Endoscopy;  Laterality: N/A;   ESOPHAGOGASTRODUODENOSCOPY (EGD) WITH PROPOFOL N/A 01/19/2021   Procedure: ESOPHAGOGASTRODUODENOSCOPY (EGD) WITH PROPOFOL;  Surgeon: Charlott Rakes, MD;  Location: [facility] ENDOSCOPY;  Service: Endoscopy;  Laterality: N/A;   ESOPHAGOGASTRODUODENOSCOPY (EGD) WITH PROPOFOL N/A 02/06/2021   Procedure: ESOPHAGOGASTRODUODENOSCOPY (EGD) WITH PROPOFOL;  Surgeon: Kerin Salen,  MD;  Location: [facility] ENDOSCOPY;  Service: Gastroenterology;  Laterality: N/A;   ESOPHAGOGASTRODUODENOSCOPY ENDOSCOPY  08/19/2006   with laser treatment   HEMOSTASIS CLIP PLACEMENT  12/24/2020   Procedure: HEMOSTASIS CLIP PLACEMENT;  Surgeon: Willis Modena, MD;  Location: [facility] ENDOSCOPY;  Service: Endoscopy;;   HEMOSTASIS CLIP PLACEMENT  01/19/2021   Procedure: HEMOSTASIS CLIP PLACEMENT;  Surgeon: Charlott Rakes, MD;  Location: [facility] ENDOSCOPY;  Service: Endoscopy;;   HEMOSTASIS CLIP PLACEMENT  11/22/2021   Procedure: HEMOSTASIS CLIP PLACEMENT;  Surgeon: Lynann Bologna, DO;  Location: [name];  Service: Gastroenterology;;   HOT HEMOSTASIS N/A 07/06/2014   Procedure: HOT HEMOSTASIS (ARGON PLASMA COAGULATION/BICAP);  Surgeon: Vida Rigger, MD;  Location: [facility] ENDOSCOPY;  Service: Endoscopy;  Laterality: N/A;   HOT HEMOSTASIS N/A 09/28/2015   Procedure: HOT HEMOSTASIS (ARGON PLASMA COAGULATION/BICAP);  Surgeon: Vida Rigger, MD;  Location: [facility] ENDOSCOPY;  Service: Endoscopy;  Laterality: N/A;   HOT HEMOSTASIS N/A 12/04/2017   Procedure: HOT HEMOSTASIS (ARGON PLASMA COAGULATION/BICAP);  Surgeon: Charlott Rakes, MD;  Location: [facility] ENDOSCOPY;  Service: Endoscopy;  Laterality: N/A;   HOT HEMOSTASIS  11/22/2021   Procedure: HOT HEMOSTASIS (ARGON PLASMA COAGULATION/BICAP);  Surgeon: Lynann Bologna, DO;  Location: [facility] ENDOSCOPY;  Service: Gastroenterology;;   [address] SELECTIVE EACH ADDITIONAL VESSEL  02/05/2021   IR ANGIOGRAM VISCERAL SELECTIVE  02/05/2021   IR EMBO ART  VEN HEMORR LYMPH EXTRAV  INC GUIDE ROADMAPPING  02/05/2021   IR US GUIDE VASC ACCESS RIGHT  02/05/2021   NASAL HEMORRHAGE CONTROL     "for bleeding"    SAVORY DILATION  02/26/2011   Procedure: SAVORY DILATION;  Surgeon: Barrie Folk, MD;  Location: [name];  Service: Endoscopy;  Laterality: N/A;  c-arm needed   SCLEROTHERAPY  12/24/2020   Procedure: SCLEROTHERAPY;  Surgeon: Willis Modena, MD;  Location: [facility] ENDOSCOPY;   Service: Endoscopy;;   SCLEROTHERAPY  01/19/2021   Procedure: Susa Day;  Surgeon: Charlott Rakes, MD;  Location: [facility] ENDOSCOPY;  Service: Endoscopy;;   SUBMUCOSAL INJECTION  12/04/2017   Procedure: SUBMUCOSAL INJECTION;  Surgeon: Charlott Rakes, MD;  Location: [facility] ENDOSCOPY;  Service: Endoscopy;;     reports that she quit smoking about 51 years ago. Her smoking use included cigarettes. She started smoking about 71 years ago. She has a 20 pack-year smoking history. She has never used smokeless tobacco. She reports that she does not drink alcohol and does not use drugs.  Allergies  Allergen Reactions   Asa [Aspirin] Nausea And Vomiting    Family History  Problem Relation Age of Onset   Healthy Mother    Stroke Father    Diabetes Niece    Breast cancer Other    Malignant hyperthermia Neg Hx    Seizures Neg Hx    Lung cancer Neg Hx      Prior to Admission medications   Medication Sig Start Date End Date Taking? Authorizing Provider  acetaminophen (TYLENOL) 325 MG tablet Take 650 mg by mouth every 4 (four) hours as needed for mild pain.   Yes [provider]  atorvastatin (LIPITOR) 10 MG tablet Take 10 mg by mouth daily.   Yes [provider]  carvedilol (COREG) 3.125 MG tablet Take 3.125 mg by mouth 2 (two) times daily.   Yes [provider]  ferrous sulfate 325 (65 FE) MG tablet Take 325 mg by mouth 2 (two) times daily.   Yes [provider]  gabapentin (NEURONTIN) 100 MG capsule Take 1 capsule (100 mg total) by mouth in the morning AND 2 capsules (200 mg total) at bedtime. Patient taking differently: Give 100 mg (1 capsule) in the morning, 200 mg (2 capsules) in the evening. 12/08/21  Yes Evlyn Kanner, MD  Insulin Glargine (BASAGLAR KWIKPEN) 100 UNIT/ML Inject 8 Units into the skin daily.   Yes [provider]  lactulose (CHRONULAC) 10 GM/15ML solution Take 10 g by mouth 3 (three) times daily.   Yes [provider]   levETIRAcetam (KEPPRA) 500 MG tablet Take 1 tablet (500 mg total) by mouth 2 (two) times daily. 02/12/21  Yes Andrey Campanile, MD  mirtazapine (REMERON) 7.5 MG tablet Take 7.5 mg by mouth at bedtime.   Yes [provider]  morphine (ROXANOL) 20 MG/ML concentrated solution Take 5 mg by mouth every 6 (six) hours as needed (pain).   Yes [provider]  Nutritional Supplement LIQD Take 120 mLs by mouth 2 (two) times daily between meals.   Yes [provider]  Nutritional Supplements (NUTRITIONAL SUPPLEMENT PO) Take 1 each by mouth See admin instructions. Give 1 frozen nutritional treat with lunch and dinner.   Yes [provider]  omeprazole (PRILOSEC) 40 MG capsule Take 40 mg by mouth 2 (two) times daily.   Yes [provider]  oxymetazoline (AFRIN) 0.05 % nasal spray Place 2 sprays into both nostrils every 4 (four) hours as needed (nose bleeds).   Yes  [provider]  PROTEIN PO Take 30 mLs by mouth daily. Liquid protein   Yes [provider]  sodium chloride (OCEAN) 0.65 % SOLN nasal spray Place 2 sprays into both nostrils every 4 (four) hours as needed (nasal dryness).   Yes [provider]  torsemide (DEMADEX) 20 MG tablet Take 1 tablet (20 mg total) by mouth as needed (please take 1 20mg  tablet as needed for weight gain of 2 pounds overnight or 5 pounds in a week). Patient taking differently: Take 20 mg by mouth daily. 03/05/21 01/16/23 Yes Monge, Petra Kuba, NP  traMADol (ULTRAM) 50 MG tablet Take 50 mg by mouth 2 (two) times daily.   Yes [provider]  triamcinolone ointment (KENALOG) 0.5 % Apply 1 Application topically every 12 (twelve) hours as needed (itching).   Yes [provider]  TRULICITY 3 MG/0.5ML SOPN Inject 3 mg into the skin once a week. Patient taking differently: Inject 1.5 mg into the skin every Thursday. 09/25/21  Yes Steffanie Rainwater, MD    Physical Exam: Vitals:   11/30/22 1159  11/30/22 1515 11/30/22 1612  BP: 132/61 (!) 105/57 (!) 111/57  Pulse: 95 84 85  Resp: (!) 22 15 12   Temp: 98.2 F (36.8 C)  (!) 97.4 F (36.3 C)  TempSrc: Axillary  Axillary  SpO2: 100% 100% 100%    Constitutional: Very deconditioned, chronically ill looking, weak, lying in bed, confused Vitals:   11/30/22 1159 11/30/22 1515 11/30/22 1612  BP: 132/61 (!) 105/57 (!) 111/57  Pulse: 95 84 85  Resp: (!) 22 15 12   Temp: 98.2 F (36.8 C)  (!) 97.4 F (36.3 C)  TempSrc: Axillary  Axillary  SpO2: 100% 100% 100%   Eyes: Eyes closed Neck: normal, supple, no masses, no thyromegaly Respiratory: diminished air sounds on bases, no wheezing, no crackles. Normal respiratory effort. No accessory muscle use.  Cardiovascular: Regular rate and rhythm, no murmurs / rubs / gallops. No extremity edema.  Abdomen: no tenderness, no masses palpated. No hepatosplenomegaly. Bowel sounds positive.  Musculoskeletal: no clubbing / cyanosis Skin: no rashes, lesions, ulcers. No induration Neurologic: Confused, does not follow commands Foley Catheter:None  Labs on Admission: I have personally reviewed following labs and imaging studies  CBC: Recent Labs  Lab 11/30/22 1230 11/30/22 1232 11/30/22 1249  WBC  --   --  11.6*  NEUTROABS  --   --  9.4*  HGB 8.8* 8.8* 8.2*  HCT 26.0* 26.0* 27.4*  MCV  --   --  100.7*  PLT  --   --  190   Basic Metabolic Panel: Recent Labs  Lab 11/30/22 1230 11/30/22 1232 11/30/22 1249  NA 149* 149* 151*  K 3.8 3.8 3.8  CL  --  108 111  CO2  --   --  31  GLUCOSE  --  52* 69*  BUN  --  23 20  CREATININE  --  1.30* 1.22*  CALCIUM  --   --  8.0*   GFR: CrCl cannot be calculated (Unknown ideal weight.). Liver Function Tests: Recent Labs  Lab 11/30/22 1249  AST 32  ALT 21  ALKPHOS 178*  BILITOT 1.5*  PROT 4.9*  ALBUMIN <1.5*   No results for input(s): "LIPASE", "AMYLASE" in the last 168 hours. No results for input(s): "AMMONIA" in the last 168  hours. Coagulation Profile: No results for input(s): "INR", "PROTIME" in the last 168 hours. Cardiac Enzymes: No results for input(s): "CKTOTAL", "CKMB", "CKMBINDEX", "TROPONINI" in the  last 168 hours. BNP (last 3 results) No results for input(s): "PROBNP" in the last 8760 hours. HbA1C: No results for input(s): "HGBA1C" in the last 72 hours. CBG: Recent Labs  Lab 11/30/22 1226 11/30/22 1330  GLUCAP 32* 119*   Lipid Profile: No results for input(s): "CHOL", "HDL", "LDLCALC", "TRIG", "CHOLHDL", "LDLDIRECT" in the last 72 hours. Thyroid Function Tests: Recent Labs    11/30/22 1249  TSH 0.991   Anemia Panel: No results for input(s): "VITAMINB12", "FOLATE", "FERRITIN", "TIBC", "IRON", "RETICCTPCT" in the last 72 hours. Urine analysis:    Component Value Date/Time   COLORURINE AMBER (A) 11/30/2022 1450   APPEARANCEUR CLEAR 11/30/2022 1450   LABSPEC 1.020 11/30/2022 1450   LABSPEC 1.025 10/19/2006 1254   PHURINE 5.0 11/30/2022 1450   GLUCOSEU NEGATIVE 11/30/2022 1450   HGBUR NEGATIVE 11/30/2022 1450   BILIRUBINUR NEGATIVE 11/30/2022 1450   BILIRUBINUR neg 05/21/2016 1605   BILIRUBINUR Negative 10/19/2006 1254   KETONESUR NEGATIVE 11/30/2022 1450   PROTEINUR 30 (A) 11/30/2022 1450   UROBILINOGEN 0.2 05/21/2016 1605   UROBILINOGEN 0.2 10/02/2014 2307   NITRITE NEGATIVE 11/30/2022 1450   LEUKOCYTESUR NEGATIVE 11/30/2022 1450   LEUKOCYTESUR Negative 10/19/2006 1254    Radiological Exams on Admission: DG Chest Port 1 View  Result Date: 11/30/2022 CLINICAL DATA:  Altered mental status. EXAM: PORTABLE CHEST 1 VIEW COMPARISON:  07/04/2021. FINDINGS: There is small-to-moderate layering left pleural effusion. There is superimposed approximately 2.1 x 3.2 cm opacity overlying the left upper mid lung zone. The opacity is present since the prior study dating back to 03/02/2021. However, slightly increased in size and density when compared to the prior exam. There is sharply marginated  triangular opacity overlying the right mid lung zone, laterally. This may represent consolidation versus atelectasis. There are additional atelectatic changes at the lung bases. Right lateral costophrenic angle is obscured by overlying monitoring electrodes however, there is probable layering trace right pleural effusion. No pneumothorax. Stable cardio-mediastinal silhouette. No acute osseous abnormalities. The soft tissues are within normal limits. Right-sided CT Port-A-Cath is seen with its tip overlying the cavoatrial junction region. IMPRESSION: 1. Small-to-moderate layering left pleural effusion. 2. Superimposed opacity overlying the left upper mid lung zone, slightly increased in size and density when compared to the prior exam dating back to 03/02/2021. Recommend additional imaging to exclude slow growing neoplastic process, if not previously performed. Electronically Signed   By: Jules Schick M.D.   On: 11/30/2022 16:11   CT Head Wo Contrast  Result Date: 11/30/2022 CLINICAL DATA:  Right-sided gaze and dysphagia for 3 days EXAM: CT HEAD WITHOUT CONTRAST TECHNIQUE: Contiguous axial images were obtained from the base of the skull through the vertex without intravenous contrast. RADIATION DOSE REDUCTION: This exam was performed according to the departmental dose-optimization program which includes automated exposure control, adjustment of the mA and/or kV according to patient size and/or use of iterative reconstruction technique. COMPARISON:  CT head 02/03/2021 FINDINGS: Brain: There is no acute intracranial hemorrhage, extra-axial fluid collection, or acute infarct. Parenchymal volume is within expected limits for age. The ventricles are stable in size. Gray-white differentiation is preserved. Patchy hypodensity in the supratentorial white matter likely reflects underlying chronic small-vessel ischemic change. The pituitary and suprasellar region are normal. There is no mass lesion. There is no mass  effect or midline shift. Vascular: There is calcification of the bilateral carotid siphons. Skull: Normal. Negative for fracture or focal lesion. Sinuses/Orbits: The imaged paranasal sinuses are clear. Bilateral lens implants are in place. The  globes and orbits are otherwise unremarkable. Other: The mastoid air cells and middle ear cavities are clear. IMPRESSION: No acute intracranial pathology. Electronically Signed   By: Lesia Hausen M.D.   On: 11/30/2022 15:47     Assessment/Plan Principal Problem:   AMS (altered mental status) Active Problems:   Type 2 diabetes mellitus ([name]   Chronic diastolic CHF (congestive heart failure) ([name]   Essential hypertension   CKD (chronic kidney disease), stage III ([name]   Dementia ([name]   PAF (paroxysmal atrial fibrillation) ([name]   Altered mental status on the background of dementia: Unclear etiology but likely metabolic encephalopathy.  Patient is confused at baseline but usually she talks and eats.  Noticed to have decreased oral intake last few days, does not talk.  Has been noticed to have gradual decline in last few months.  UA not suspicious for UTI. Sodium level of 151.  Hypernatremia/dehydration could have contributed.  CT head did not show any acute findings. Continue delirium precautions, frequent reorientation.  EEG, MRI would be considered.  Neurology already following. TSH level normal.  Will check vitamin B12  Hypernatremia/ CKD stage IIIa: On reviewing her previous records, her baseline creatinine is 1.2-1.3.  Last creatinine was 0.9 on 03/16/2022.  Currently kidney function at baseline.  BUN not elevated.  But may be dehydrated due to poor oral intake.  Sodium of 151.  Will start on D5 W.  Patient is also hypoglycemic.  Type 2 diabetes: Apparently she takes insulin as per her med rec.  Patient is hypoglycemic.  Continue D5W.  Stop insulin  Leucocytosis: This is most likely reactive.  Continue to hold antibiotics  Hypertension: Takes  carvedilol at home.  Currently on hold.  Blood pressure stable.  History of seizure: Takes Keppra.  Not sure if she had seizure episode.  Continue Keppra at current dose.  Will get an EEG  Hx of diastolic CHF: Takes Demadex as needed.  Last echo showed EF of 70 to 75%, hyperdynamic function, diastolic function could not be evaluated .  History of hyperlipidemia: She was on  on Lipitor.  Goals of care: Elderly patient with multiple comorbidities, baseline dementia.  Confused at baseline.  Presented with more confusion, poor oral intake.  Patient has very poor quality of life.  She is apparently she is involved with hospice as an outpatient but still remains full code.  Will consult palliative care. Had a long discussion with her she is at bedside and also talked to her husband at the same time.  We decided to change her full CODE STATUS to DNR. Will continue to treat but will be nonaggressive.  If her mentation does not improve, family agreeable for residential hospice.    Severity of Illness: The appropriate patient status for this patient is OBSERVATION.      DVT prophylaxis:Heparin [place] Code Status: Full Family Communication: 2 nieces at bedside, husband on phone Consults called: Neurology, palliaitve care     Burnadette Pop MD [facility]  11/30/2022, 5:06 PM

## 2022-11-30 NOTE — ED Provider Notes (Signed)
[place] EMERGENCY DEPARTMENT AT [facility] Provider Note   CSN: [id number] Arrival date & time: 11/30/22  1139     History  Chief Complaint  Patient presents with   Neurologic Problem    [name] is a 82 y.o. female.   Neurologic Problem  82 year old female history advanced dementia, type 2 diabetes, CHF, anemia.  Reportedly following with palliative care.  Patient presents from [facility] reportedly with 3 days of right-sided gaze deviation, dysphagia, altered mental status.  She has not been receiving her medications.  Here she is unable to participate in history.  I spoke with patient's niece Angelique Blonder over the phone.  I was unable to reach her husband.  Angelique Blonder reports patient has had significant incline over the last few months.  She is currently undergoing hospice care but her husband is her power of attorney and has wanted her to be full code.  They report over the weekend they noted she was talking less, and were unsure what was causing this.  Somewhat decreased p.o. intake as well.  Timeline of decline this weekend is not entirely clear.  She does have right-sided baseline weakness and can talk some but has worsened over the last few months.     Home Medications Prior to Admission medications   Medication Sig Start Date End Date Taking? Authorizing Provider  acetaminophen (TYLENOL) 325 MG tablet Take 650 mg by mouth every 4 (four) hours as needed for mild pain.   Yes [provider]  atorvastatin (LIPITOR) 10 MG tablet Take 10 mg by mouth daily.   Yes [provider]  carvedilol (COREG) 3.125 MG tablet Take 3.125 mg by mouth 2 (two) times daily.   Yes [provider]  ferrous sulfate 325 (65 FE) MG tablet Take 325 mg by mouth 2 (two) times daily.   Yes [provider]  gabapentin (NEURONTIN) 100 MG capsule Take 1 capsule (100 mg total) by mouth in the morning AND 2 capsules (200 mg total) at bedtime. Patient taking  differently: Give 100 mg (1 capsule) in the morning, 200 mg (2 capsules) in the evening. 12/08/21  Yes Evlyn Kanner, MD  Insulin Glargine (BASAGLAR KWIKPEN) 100 UNIT/ML Inject 8 Units into the skin daily.   Yes [provider]  lactulose (CHRONULAC) 10 GM/15ML solution Take 10 g by mouth 3 (three) times daily.   Yes [provider]  levETIRAcetam (KEPPRA) 500 MG tablet Take 1 tablet (500 mg total) by mouth 2 (two) times daily. 02/12/21  Yes Andrey Campanile, MD  mirtazapine (REMERON) 7.5 MG tablet Take 7.5 mg by mouth at bedtime.   Yes [provider]  morphine (ROXANOL) 20 MG/ML concentrated solution Take 5 mg by mouth every 6 (six) hours as needed (pain).   Yes [provider]  Nutritional Supplement LIQD Take 120 mLs by mouth 2 (two) times daily between meals.   Yes [provider]  Nutritional Supplements (NUTRITIONAL SUPPLEMENT PO) Take 1 each by mouth See admin instructions. Give 1 frozen nutritional treat with lunch and dinner.   Yes [provider]  omeprazole (PRILOSEC) 40 MG capsule Take 40 mg by mouth 2 (two) times daily.   Yes [provider]  oxymetazoline (AFRIN) 0.05 % nasal spray Place 2 sprays into both nostrils every 4 (four) hours as needed (nose bleeds).   Yes [provider]  PROTEIN PO Take 30 mLs by mouth daily. Liquid protein   Yes [provider]  sodium chloride (  OCEAN) 0.65 % SOLN nasal spray Place 2 sprays into both nostrils every 4 (four) hours as needed (nasal dryness).   Yes [provider]  torsemide (DEMADEX) 20 MG tablet Take 1 tablet (20 mg total) by mouth as needed (please take 1 20mg  tablet as needed for weight gain of 2 pounds overnight or 5 pounds in a week). Patient taking differently: Take 20 mg by mouth daily. 03/05/21 01/16/23 Yes Monge, Petra Kuba, NP  traMADol (ULTRAM) 50 MG tablet Take 50 mg by mouth 2 (two) times daily.   Yes [provider]  triamcinolone  ointment (KENALOG) 0.5 % Apply 1 Application topically every 12 (twelve) hours as needed (itching).   Yes [provider]  TRULICITY 3 MG/0.5ML SOPN Inject 3 mg into the skin once a week. Patient taking differently: Inject 1.5 mg into the skin every Thursday. 09/25/21  Yes Steffanie Rainwater, MD      Allergies    Asa [aspirin]    Review of Systems   Review of Systems Review of systems completed and notable as per HPI.  ROS otherwise negative.   Physical Exam Updated Vital Signs BP (!) 111/57   Pulse 85   Temp (!) 97.4 F (36.3 C) (Axillary)   Resp 12   LMP  (LMP Unknown)   SpO2 100%  Physical Exam Vitals and nursing note reviewed.  Constitutional:      Appearance: She is well-developed. She is not ill-appearing.  HENT:     Head: Normocephalic and atraumatic.     Nose: Nose normal.     Mouth/Throat:     Mouth: Mucous membranes are moist.     Pharynx: Oropharynx is clear.  Eyes:     Conjunctiva/sclera: Conjunctivae normal.     Comments: Right-sided gaze deviation.  Pupils reactive.  Cardiovascular:     Rate and Rhythm: Normal rate and regular rhythm.     Pulses: Normal pulses.     Heart sounds: Normal heart sounds. No murmur heard. Pulmonary:     Effort: Pulmonary effort is normal. No respiratory distress.     Breath sounds: Normal breath sounds.  Abdominal:     Palpations: Abdomen is soft.     Tenderness: There is no abdominal tenderness.  Musculoskeletal:        General: No swelling.     Cervical back: Normal range of motion and neck supple. No rigidity or tenderness.     Right lower leg: No edema.     Left lower leg: No edema.  Skin:    General: Skin is warm and dry.     Capillary Refill: Capillary refill takes less than 2 seconds.  Neurological:     Comments: Patient arouses somewhat to voice but keeps her eyes closed.  When I try to open her eyes she presents.  She also resist any sort of movement of her extremities and has increased tone and some  weakness on the right.  She does not follow commands.  She will withdraw from pain in all extremities.     ED Results / Procedures / Treatments   Labs (all labs ordered are listed, but only abnormal results are displayed) Labs Reviewed  COMPREHENSIVE METABOLIC PANEL - Abnormal; Notable for the following components:      Result Value   Sodium 151 (*)    Glucose, Bld 69 (*)    Creatinine, Ser 1.22 (*)    Calcium 8.0 (*)    Total Protein 4.9 (*)    Albumin <1.5 (*)  Alkaline Phosphatase 178 (*)    Total Bilirubin 1.5 (*)    GFR, Estimated 44 (*)    All other components within normal limits  CBC WITH DIFFERENTIAL/PLATELET - Abnormal; Notable for the following components:   WBC 11.6 (*)    RBC 2.72 (*)    Hemoglobin 8.2 (*)    HCT 27.4 (*)    MCV 100.7 (*)    MCHC 29.9 (*)    RDW 16.5 (*)    Neutro Abs 9.4 (*)    Monocytes Absolute 1.1 (*)    Abs Immature Granulocytes 0.09 (*)    All other components within normal limits  URINALYSIS, ROUTINE W REFLEX MICROSCOPIC - Abnormal; Notable for the following components:   Color, Urine AMBER (*)    Protein, ur 30 (*)    Bacteria, UA MANY (*)    All other components within normal limits  CBG MONITORING, ED - Abnormal; Notable for the following components:   Glucose-Capillary 32 (*)    All other components within normal limits  I-STAT CHEM 8, ED - Abnormal; Notable for the following components:   Sodium 149 (*)    Creatinine, Ser 1.30 (*)    Glucose, Bld 52 (*)    Calcium, Ion 1.08 (*)    Hemoglobin 8.8 (*)    HCT 26.0 (*)    All other components within normal limits  I-STAT VENOUS BLOOD GAS, ED - Abnormal; Notable for the following components:   pH, Ven 7.535 (*)    pCO2, Ven 38.2 (*)    pO2, Ven 48 (*)    Bicarbonate 32.3 (*)    TCO2 33 (*)    Acid-Base Excess 9.0 (*)    Sodium 149 (*)    Calcium, Ion 1.08 (*)    HCT 26.0 (*)    Hemoglobin 8.8 (*)    All other components within normal limits  CBG MONITORING, ED -  Abnormal; Notable for the following components:   Glucose-Capillary 119 (*)    All other components within normal limits  RESP PANEL BY RT-PCR (RSV, FLU A&B, COVID)  [id number]  URINE CULTURE  TSH    EKG EKG Interpretation Date/Time:  Monday November 30 2022 12:49:14 EDT Ventricular Rate:  89 PR Interval:  152 QRS Duration:  52 QT Interval:  486 QTC Calculation: 592 R Axis:   -6  Text Interpretation: Sinus rhythm Ventricular premature complex Aberrant complex Low voltage, precordial leads Borderline abnrm T, anterolateral leads Prolonged QT interval Confirmed by Fulton Reek [phone]) on 11/30/2022 12:52:07 PM  Radiology DG Chest Port 1 View  Result Date: 11/30/2022 CLINICAL DATA:  Altered mental status. EXAM: PORTABLE CHEST 1 VIEW COMPARISON:  07/04/2021. FINDINGS: There is small-to-moderate layering left pleural effusion. There is superimposed approximately 2.1 x 3.2 cm opacity overlying the left upper mid lung zone. The opacity is present since the prior study dating back to 03/02/2021. However, slightly increased in size and density when compared to the prior exam. There is sharply marginated triangular opacity overlying the right mid lung zone, laterally. This may represent consolidation versus atelectasis. There are additional atelectatic changes at the lung bases. Right lateral costophrenic angle is obscured by overlying monitoring electrodes however, there is probable layering trace right pleural effusion. No pneumothorax. Stable cardio-mediastinal silhouette. No acute osseous abnormalities. The soft tissues are within normal limits. Right-sided CT Port-A-Cath is seen with its tip overlying the cavoatrial junction region. IMPRESSION: 1. Small-to-moderate layering left pleural effusion. 2. Superimposed opacity overlying the left upper mid lung zone,  slightly increased in size and density when compared to the prior exam dating back to 03/02/2021. Recommend additional imaging to exclude slow  growing neoplastic process, if not previously performed. Electronically Signed   By: Jules Schick M.D.   On: 11/30/2022 16:11   CT Head Wo Contrast  Result Date: 11/30/2022 CLINICAL DATA:  Right-sided gaze and dysphagia for 3 days EXAM: CT HEAD WITHOUT CONTRAST TECHNIQUE: Contiguous axial images were obtained from the base of the skull through the vertex without intravenous contrast. RADIATION DOSE REDUCTION: This exam was performed according to the departmental dose-optimization program which includes automated exposure control, adjustment of the mA and/or kV according to patient size and/or use of iterative reconstruction technique. COMPARISON:  CT head 02/03/2021 FINDINGS: Brain: There is no acute intracranial hemorrhage, extra-axial fluid collection, or acute infarct. Parenchymal volume is within expected limits for age. The ventricles are stable in size. Gray-white differentiation is preserved. Patchy hypodensity in the supratentorial white matter likely reflects underlying chronic small-vessel ischemic change. The pituitary and suprasellar region are normal. There is no mass lesion. There is no mass effect or midline shift. Vascular: There is calcification of the bilateral carotid siphons. Skull: Normal. Negative for fracture or focal lesion. Sinuses/Orbits: The imaged paranasal sinuses are clear. Bilateral lens implants are in place. The globes and orbits are otherwise unremarkable. Other: The mastoid air cells and middle ear cavities are clear. IMPRESSION: No acute intracranial pathology. Electronically Signed   By: Lesia Hausen M.D.   On: 11/30/2022 15:47    Procedures Procedures    Medications Ordered in ED Medications  dextrose 50 % solution (  Not Given 11/30/22 1339)  dextrose 50 % solution 50 mL (50 mLs Intravenous Given 11/30/22 1229)  lactated ringers bolus 500 mL (0 mLs Intravenous Stopped 11/30/22 1458)  levETIRAcetam (KEPPRA) IVPB 1500 mg/ 100 mL premix (0 mg Intravenous Stopped  11/30/22 1519)    ED Course/ Medical Decision Making/ A&P Clinical Course as of 11/30/22 1652  Mon Nov 30, 2022  1249 Niece: Weak on the R side at baseline, hx seizures, usually has trouble seeing people on the left.  Usually can talk some but worsening over last few months.  Getting morphine, Tramadol.  Patient's husband makes decisions, wants full code. [JD]  1639 [phone] Jonny Ruiz [JD]    Clinical Course User Index [JD] Laurence Spates, MD                                 Medical Decision Making Amount and/or Complexity of Data Reviewed Labs: ordered. Radiology: ordered.  Risk Prescription drug management. Decision regarding hospitalization.   Medical Decision Making:   CHISTINE BASIL is a 82 y.o. female who presented to the ED today with altered mental status, gaze deviation.  Vital signs reviewed.  Exam she is very limited history.  She has poor baseline which has been declining recently and is already under hospice care but is full code per family.  She is gaze deviation, I am concerned for possible stroke, seizure, metabolic abnormality.  Timeline is somewhat unclear but appears she has been altered for at least 3 days, she is not within the acute stroke window.  Will plan for CT head, lab workup.  Will give her Keppra as she has not been taking her medications either.   Patient placed on continuous vitals and telemetry monitoring while in ED which was reviewed periodically.  Reviewed and confirmed nursing  documentation for past medical history, family history, social history.   Reassessment and Plan:   Lab work here notable for hyponatremia, low albumin, AKI, mild leukocytosis.  No systemic signs of infection.  CT head is unremarkable.  Talked with Dr. Amada Jupiter who recommended EEG, MRI family wants further workup.  I talked to family again, they confirm she is under hospice care but has been full code per patient's husband who is her decision-maker.  I spoke with  patient's husband who is her power of attorney.  I discussed patient's history and status under hospice care.  He acknowledges she is under hospice care, however he would like her to be admitted to the hospital and undergo further investigation and treatment for her altered mental status.  I discussed the patient with the hospitalist and she was admitted.  Patient's presentation is most consistent with acute presentation with potential threat to life or bodily function.           Final Clinical Impression(s) / ED Diagnoses Final diagnoses:  Altered mental status, unspecified altered mental status type    Rx / DC Orders ED Discharge Orders     None         Laurence Spates, MD 11/30/22 [phone]

## 2022-11-30 NOTE — ED Notes (Signed)
ED TO INPATIENT HANDOFF REPORT  ED Nurse Name and Phone #: Donny Pique, RN [phone]  S Name/Age/Gender [name] 81 y.o. female Room/Bed: 045C/045C  Code Status   Code Status: Limited: Do not attempt resuscitation (DNR) -DNR-LIMITED -Do Not Intubate/DNI   Home/SNF/Other Skilled nursing facility Not oriented Is this baseline? Yes   Triage Complete: Triage complete  Chief Complaint AMS (altered mental status) [R41.82]  Triage Note Pt BIB [facility] from [facility] with R sided gaze and dysphagia x 3 days. She has not had her home medications in 3 days d/t dysphagia symptoms. Mepilex dressing present on R elbow.    Allergies Allergies  Allergen Reactions   Asa [Aspirin] Nausea And Vomiting    Level of Care/Admitting Diagnosis ED Disposition     ED Disposition  Admit   Condition  --   Comment  Hospital Area: [facility] [[id number]]  Level of Care: Telemetry Medical [104]  May place patient in observation at [facility] or [facility] if equivalent level of care is available:: No  Covid Evaluation: Confirmed COVID Negative  Diagnosis: AMS (altered mental status) [[id number]]  Admitting Physician: Burnadette Pop [[id number]]  Attending Physician: Burnadette Pop [[id number]]          B Medical/Surgery History Past Medical History:  Diagnosis Date   Chronic anemia    Chronic diastolic CHF (congestive heart failure) ([name] 10/03/2013   Chronic GI bleeding    /notes 11/29/2014   Family history of anesthesia complication    "niece has a hard time coming out" (09/15/2012)   Frequent nosebleeds    chronic   Gastric AV malformation    /notes 11/29/2014   GERD (gastroesophageal reflux disease)    Heart murmur 04/01/2017   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   Past Surgical History:  Procedure Laterality Date   CATARACT EXTRACTION     "I think it was just one eye"   ESOPHAGOGASTRODUODENOSCOPY  02/26/2011   Procedure: ESOPHAGOGASTRODUODENOSCOPY (EGD);  Surgeon: Barrie Folk, MD;  Location: [facility];  Service: Endoscopy;  Laterality: N/A;   ESOPHAGOGASTRODUODENOSCOPY N/A 11/08/2012   Procedure: ESOPHAGOGASTRODUODENOSCOPY (EGD);  Surgeon: Theda Belfast, MD;  Location: [facility];  Service: Endoscopy;  Laterality: N/A;   ESOPHAGOGASTRODUODENOSCOPY N/A 10/04/2013   Procedure: ESOPHAGOGASTRODUODENOSCOPY (EGD);  Surgeon: Vertell Novak., MD;  Location: [facility];  Service: Endoscopy;  Laterality: N/A;  with APC on stand-by   ESOPHAGOGASTRODUODENOSCOPY N/A 07/06/2014   Procedure: ESOPHAGOGASTRODUODENOSCOPY (EGD);  Surgeon: Vida Rigger, MD;  Location: [facility];  Service: Endoscopy;  Laterality: N/A;   ESOPHAGOGASTRODUODENOSCOPY N/A 09/05/2014   Procedure: ESOPHAGOGASTRODUODENOSCOPY (EGD);  Surgeon: Carman Ching, MD;  Location: [facility];  Service: Endoscopy;  Laterality: N/A;  APC on standby to control bleeding   ESOPHAGOGASTRODUODENOSCOPY N/A 11/29/2014   Procedure: ESOPHAGOGASTRODUODENOSCOPY (EGD);  Surgeon: Charlott Rakes,  MD;  Location: [facility] ENDOSCOPY;  Service: Endoscopy;  Laterality: N/A;   ESOPHAGOGASTRODUODENOSCOPY N/A 09/28/2015   Procedure: ESOPHAGOGASTRODUODENOSCOPY (EGD);  Surgeon: Vida Rigger, MD;  Location: [facility] ENDOSCOPY;  Service: Endoscopy;  Laterality: N/A;   ESOPHAGOGASTRODUODENOSCOPY N/A 11/22/2021   Procedure: ESOPHAGOGASTRODUODENOSCOPY (EGD);  Surgeon:  Lynann Bologna, DO;  Location: [facility];  Service: Gastroenterology;  Laterality: N/A;   ESOPHAGOGASTRODUODENOSCOPY (EGD) WITH PROPOFOL N/A 12/04/2017   Procedure: ESOPHAGOGASTRODUODENOSCOPY (EGD) WITH PROPOFOL;  Surgeon: Charlott Rakes, MD;  Location: [facility] ENDOSCOPY;  Service: Endoscopy;  Laterality: N/A;   ESOPHAGOGASTRODUODENOSCOPY (EGD) WITH PROPOFOL N/A 12/24/2020   Procedure: ESOPHAGOGASTRODUODENOSCOPY (EGD) WITH PROPOFOL;  Surgeon: Willis Modena, MD;  Location: [name] ENDOSCOPY;  Service: Endoscopy;  Laterality: N/A;   ESOPHAGOGASTRODUODENOSCOPY (EGD) WITH PROPOFOL N/A 01/19/2021   Procedure: ESOPHAGOGASTRODUODENOSCOPY (EGD) WITH PROPOFOL;  Surgeon: Charlott Rakes, MD;  Location: [facility] ENDOSCOPY;  Service: Endoscopy;  Laterality: N/A;   ESOPHAGOGASTRODUODENOSCOPY (EGD) WITH PROPOFOL N/A 02/06/2021   Procedure: ESOPHAGOGASTRODUODENOSCOPY (EGD) WITH PROPOFOL;  Surgeon: Kerin Salen, MD;  Location: [facility] ENDOSCOPY;  Service: Gastroenterology;  Laterality: N/A;   ESOPHAGOGASTRODUODENOSCOPY ENDOSCOPY  08/19/2006   with laser treatment   HEMOSTASIS CLIP PLACEMENT  12/24/2020   Procedure: HEMOSTASIS CLIP PLACEMENT;  Surgeon: Willis Modena, MD;  Location: [facility] ENDOSCOPY;  Service: Endoscopy;;   HEMOSTASIS CLIP PLACEMENT  01/19/2021   Procedure: HEMOSTASIS CLIP PLACEMENT;  Surgeon: Charlott Rakes, MD;  Location: [facility] ENDOSCOPY;  Service: Endoscopy;;   HEMOSTASIS CLIP PLACEMENT  11/22/2021   Procedure: HEMOSTASIS CLIP PLACEMENT;  Surgeon: Lynann Bologna, DO;  Location: [name];  Service: Gastroenterology;;   HOT HEMOSTASIS N/A 07/06/2014   Procedure: HOT HEMOSTASIS (ARGON PLASMA COAGULATION/BICAP);  Surgeon: Vida Rigger, MD;  Location: [facility];  Service: Endoscopy;  Laterality: N/A;   HOT HEMOSTASIS N/A 09/28/2015   Procedure: HOT HEMOSTASIS (ARGON PLASMA COAGULATION/BICAP);  Surgeon: Vida Rigger, MD;  Location: [facility] ENDOSCOPY;  Service: Endoscopy;  Laterality: N/A;   HOT HEMOSTASIS N/A  12/04/2017   Procedure: HOT HEMOSTASIS (ARGON PLASMA COAGULATION/BICAP);  Surgeon: Charlott Rakes, MD;  Location: [facility] ENDOSCOPY;  Service: Endoscopy;  Laterality: N/A;   HOT HEMOSTASIS  11/22/2021   Procedure: HOT HEMOSTASIS (ARGON PLASMA COAGULATION/BICAP);  Surgeon: Lynann Bologna, DO;  Location: [facility] ENDOSCOPY;  Service: Gastroenterology;;   [address] SELECTIVE EACH ADDITIONAL VESSEL  02/05/2021   IR ANGIOGRAM VISCERAL SELECTIVE  02/05/2021   IR EMBO ART  VEN HEMORR LYMPH EXTRAV  INC GUIDE ROADMAPPING  02/05/2021   IR US GUIDE VASC ACCESS RIGHT  02/05/2021   NASAL HEMORRHAGE CONTROL     "for bleeding"    SAVORY DILATION  02/26/2011   Procedure: SAVORY DILATION;  Surgeon: Barrie Folk, MD;  Location: [name];  Service: Endoscopy;  Laterality: N/A;  c-arm needed   SCLEROTHERAPY  12/24/2020   Procedure: SCLEROTHERAPY;  Surgeon: Willis Modena, MD;  Location: [facility] ENDOSCOPY;  Service: Endoscopy;;   SCLEROTHERAPY  01/19/2021   Procedure: Susa Day;  Surgeon: Charlott Rakes, MD;  Location: [facility] ENDOSCOPY;  Service: Endoscopy;;   SUBMUCOSAL INJECTION  12/04/2017   Procedure: SUBMUCOSAL INJECTION;  Surgeon: Charlott Rakes, MD;  Location: [facility] ENDOSCOPY;  Service: Endoscopy;;     A IV Location/Drains/Wounds Patient Lines/Drains/Airways Status     Active Line/Drains/Airways     Name Placement date Placement time Site Days   Implanted Port Right Chest --  --  Chest  --   Peripheral IV 11/30/22 20 G Anterior;Left Wrist 11/30/22  1220  Wrist  less than 1  Intake/Output Last 24 hours  Intake/Output Summary (Last 24 hours) at 11/30/2022 2241 Last data filed at 11/30/2022 1458 Gross per 24 hour  Intake 500 ml  Output --  Net 500 ml    Labs/Imaging Results for orders placed or performed during the hospital encounter of 11/30/22 (from the past 48 hour(s))  CBG monitoring, ED     Status: Abnormal   Collection Time: 11/30/22 12:26 PM  Result Value Ref Range    Glucose-Capillary 32 (LL) 70 - 99 mg/dL    Comment: Glucose reference range applies only to samples taken after fasting for at least 8 hours.  I-Stat venous blood gas, ([facility] ED, [facility], [facility])     Status: Abnormal   Collection Time: 11/30/22 12:30 PM  Result Value Ref Range   pH, Ven 7.535 (H) 7.25 - 7.43   pCO2, Ven 38.2 (L) 44 - 60 mmHg   pO2, Ven 48 (H) 32 - 45 mmHg   Bicarbonate 32.3 (H) 20.0 - 28.0 mmol/L   TCO2 33 (H) 22 - 32 mmol/L   O2 Saturation 88 %   Acid-Base Excess 9.0 (H) 0.0 - 2.0 mmol/L   Sodium 149 (H) 135 - 145 mmol/L   Potassium 3.8 3.5 - 5.1 mmol/L   Calcium, Ion 1.08 (L) 1.15 - 1.40 mmol/L   HCT 26.0 (L) 36.0 - 46.0 %   Hemoglobin 8.8 (L) 12.0 - 15.0 g/dL   Sample type VENOUS   I-stat chem 8, ED (not at [facility], [facility] or [facility])     Status: Abnormal   Collection Time: 11/30/22 12:32 PM  Result Value Ref Range   Sodium 149 (H) 135 - 145 mmol/L   Potassium 3.8 3.5 - 5.1 mmol/L   Chloride 108 98 - 111 mmol/L   BUN 23 8 - 23 mg/dL   Creatinine, Ser 2.95 (H) 0.44 - 1.00 mg/dL   Glucose, Bld 52 (L) 70 - 99 mg/dL    Comment: Glucose reference range applies only to samples taken after fasting for at least 8 hours.   Calcium, Ion 1.08 (L) 1.15 - 1.40 mmol/L   TCO2 29 22 - 32 mmol/L   Hemoglobin 8.8 (L) 12.0 - 15.0 g/dL   HCT 28.4 (L) 13.2 - 44.0 %  Comprehensive metabolic panel     Status: Abnormal   Collection Time: 11/30/22 12:49 PM  Result Value Ref Range   Sodium 151 (H) 135 - 145 mmol/L   Potassium 3.8 3.5 - 5.1 mmol/L   Chloride 111 98 - 111 mmol/L   CO2 31 22 - 32 mmol/L   Glucose, Bld 69 (L) 70 - 99 mg/dL    Comment: Glucose reference range applies only to samples taken after fasting for at least 8 hours.   BUN 20 8 - 23 mg/dL   Creatinine, Ser 1.02 (H) 0.44 - 1.00 mg/dL   Calcium 8.0 (L) 8.9 - 10.3 mg/dL   Total Protein 4.9 (L) 6.5 - 8.1 g/dL   Albumin <7.2 (L) 3.5 - 5.0 g/dL   AST 32 15 - 41 U/L   ALT 21 0 - 44 U/L   Alkaline Phosphatase 178 (H) 38 - 126 U/L    Total Bilirubin 1.5 (H) 0.3 - 1.2 mg/dL   GFR, Estimated 44 (L) >60 mL/min    Comment: (NOTE) Calculated using the CKD-EPI Creatinine Equation (2021)    Anion gap 9 5 - 15    Comment: Performed at [facility], [address], [place], [place] [address]  CBC with  Differential     Status: Abnormal   Collection Time: 11/30/22 12:49 PM  Result Value Ref Range   WBC 11.6 (H) 4.0 - 10.5 K/uL   RBC 2.72 (L) 3.87 - 5.11 MIL/uL   Hemoglobin 8.2 (L) 12.0 - 15.0 g/dL   HCT 83.1 (L) 51.7 - 61.6 %   MCV 100.7 (H) 80.0 - 100.0 fL   MCH 30.1 26.0 - 34.0 pg   MCHC 29.9 (L) 30.0 - 36.0 g/dL   RDW 07.3 (H) 71.0 - 62.6 %   Platelets 190 150 - 400 K/uL   nRBC 0.0 0.0 - 0.2 %   Neutrophils Relative % 81 %   Neutro Abs 9.4 (H) 1.7 - 7.7 K/uL   Lymphocytes Relative 9 %   Lymphs Abs 1.0 0.7 - 4.0 K/uL   Monocytes Relative 9 %   Monocytes Absolute 1.1 (H) 0.1 - 1.0 K/uL   Eosinophils Relative 0 %   Eosinophils Absolute 0.0 0.0 - 0.5 K/uL   Basophils Relative 0 %   Basophils Absolute 0.0 0.0 - 0.1 K/uL   Immature Granulocytes 1 %   Abs Immature Granulocytes 0.09 (H) 0.00 - 0.07 K/uL    Comment: Performed at [facility], [address] [place], [place] [address]  TSH     Status: None   Collection Time: 11/30/22 12:49 PM  Result Value Ref Range   TSH 0.991 0.350 - 4.500 uIU/mL    Comment: Performed by a 3rd Generation assay with a functional sensitivity of <=0.01 uIU/mL. Performed at [facility], [address] [place], [place] [address]   Resp panel by RT-PCR (RSV, Flu A&B, Covid) Anterior Nasal Swab     Status: None   Collection Time: 11/30/22 12:50 PM   Specimen: Anterior Nasal Swab  Result Value Ref Range   SARS Coronavirus 2 by RT PCR NEGATIVE NEGATIVE   Influenza A by PCR NEGATIVE NEGATIVE   Influenza B by PCR NEGATIVE NEGATIVE    Comment: (NOTE) The Xpert Xpress SARS-CoV-2/FLU/RSV plus assay is intended as an aid in the diagnosis of influenza from Nasopharyngeal  swab specimens and should not be used as a sole basis for treatment. Nasal washings and aspirates are unacceptable for Xpert Xpress SARS-CoV-2/FLU/RSV testing.  Fact Sheet for Patients: BloggerCourse.com  Fact Sheet for Healthcare Providers: SeriousBroker.it  This test is not yet approved or cleared by the [place] FDA and has been authorized for detection and/or diagnosis of SARS-CoV-2 by FDA under an Emergency Use Authorization (EUA). This EUA will remain in effect (meaning this test can be used) for the duration of the COVID-19 declaration under Section 564(b)(1) of the Act, 21 U.S.C. section 360bbb-3(b)(1), unless the authorization is terminated or revoked.     Resp Syncytial Virus by PCR NEGATIVE NEGATIVE    Comment: (NOTE) Fact Sheet for Patients: BloggerCourse.com  Fact Sheet for Healthcare Providers: SeriousBroker.it  This test is not yet approved or cleared by the [place] FDA and has been authorized for detection and/or diagnosis of SARS-CoV-2 by FDA under an Emergency Use Authorization (EUA). This EUA will remain in effect (meaning this test can be used) for the duration of the COVID-19 declaration under Section 564(b)(1) of the Act, 21 U.S.C. section 360bbb-3(b)(1), unless the authorization is terminated or revoked.  Performed at [facility], [address] [place], [place] [address]   Vitamin B12     Status: Abnormal   Collection Time: 11/30/22 12:50 PM  Result Value Ref Range  Vitamin B-12 1,246 (H) 180 - 914 pg/mL    Comment: (NOTE) This assay is not validated for testing neonatal or myeloproliferative syndrome specimens for Vitamin B12 levels. Performed at [facility], [address] [place], [place] [address]   CBG monitoring, ED     Status: Abnormal   Collection Time: 11/30/22  1:30 PM  Result Value Ref Range    Glucose-Capillary 119 (H) 70 - 99 mg/dL    Comment: Glucose reference range applies only to samples taken after fasting for at least 8 hours.  Urinalysis, Routine w reflex microscopic -Urine, Catheterized     Status: Abnormal   Collection Time: 11/30/22  2:50 PM  Result Value Ref Range   Color, Urine AMBER (A) YELLOW    Comment: BIOCHEMICALS MAY BE AFFECTED BY COLOR   APPearance CLEAR CLEAR   Specific Gravity, Urine 1.020 1.005 - 1.030   pH 5.0 5.0 - 8.0   Glucose, UA NEGATIVE NEGATIVE mg/dL   Hgb urine dipstick NEGATIVE NEGATIVE   Bilirubin Urine NEGATIVE NEGATIVE   Ketones, ur NEGATIVE NEGATIVE mg/dL   Protein, ur 30 (A) NEGATIVE mg/dL   Nitrite NEGATIVE NEGATIVE   Leukocytes,Ua NEGATIVE NEGATIVE   RBC / HPF 0-5 0 - 5 RBC/hpf   WBC, UA 0-5 0 - 5 WBC/hpf   Bacteria, UA MANY (A) NONE SEEN   Squamous Epithelial / HPF 0-5 0 - 5 /HPF   Hyaline Casts, UA PRESENT     Comment: Performed at [facility], [address], [place], [place] [address]  CBG monitoring, ED     Status: Abnormal   Collection Time: 11/30/22  9:30 PM  Result Value Ref Range   Glucose-Capillary 100 (H) 70 - 99 mg/dL    Comment: Glucose reference range applies only to samples taken after fasting for at least 8 hours.   *Note: Due to a large number of results and/or encounters for the requested time period, some results have not been displayed. A complete set of results can be found in Results Review.   MR BRAIN WO CONTRAST  Result Date: 11/30/2022 CLINICAL DATA:  Encephalopathy. EXAM: MRI HEAD WITHOUT CONTRAST TECHNIQUE: Multiplanar, multiecho pulse sequences of the brain and surrounding structures were obtained without intravenous contrast. COMPARISON:  Brain MRI 01/29/2021. FINDINGS: Brain: Motion degraded study. Within this limitation, no acute infarct or hemorrhage. Stable moderate chronic small-vessel disease and generalized volume loss. No hydrocephalus or extra-axial collection. No mass or midline shift.  Vascular: Normal flow voids. Skull and upper cervical spine: Normal marrow signal. Sinuses/Orbits: No acute findings. Other: None. IMPRESSION: Motion degraded study. Within this limitation, no acute intracranial process. Electronically Signed   By: Orvan Falconer M.D.   On: 11/30/2022 21:57   EEG adult  Result Date: 11/30/2022 Charlsie Quest, MD     11/30/2022  6:41 PM Patient Name: [name] MRN: [id number] Epilepsy Attending: Charlsie Quest Referring Physician/Provider: Rejeana Brock, MD Date: 11/30/2022 Duration: 22.19 mins Patient history: 82 y.o. female with progressive encephalopathy over the past few days. EEG to evaluate for seizure Level of alertness: comatose/ lethargic AEDs during EEG study: LEV Technical aspects: This EEG study was done with scalp electrodes positioned according to the 10-20 International system of electrode placement. Electrical activity was reviewed with band pass filter of 1-70Hz , sensitivity of 7 uV/mm, display speed of 46mm/sec with a 60Hz  notched filter applied as appropriate. EEG data were recorded continuously and digitally stored.  Video monitoring was available and reviewed  as appropriate. Description: EEG showed continuous generalized 3 to 5 Hz theta-delta slowing. Hyperventilation and photic stimulation were not performed.   ABNORMALITY - Continuous slow, generalized IMPRESSION: This study is suggestive of moderate to severe diffuse encephalopathy. No seizures or epileptiform discharges were seen throughout the recording. Charlsie Quest   DG Chest Port 1 View  Result Date: 11/30/2022 CLINICAL DATA:  Altered mental status. EXAM: PORTABLE CHEST 1 VIEW COMPARISON:  07/04/2021. FINDINGS: There is small-to-moderate layering left pleural effusion. There is superimposed approximately 2.1 x 3.2 cm opacity overlying the left upper mid lung zone. The opacity is present since the prior study dating back to 03/02/2021. However, slightly increased in size  and density when compared to the prior exam. There is sharply marginated triangular opacity overlying the right mid lung zone, laterally. This may represent consolidation versus atelectasis. There are additional atelectatic changes at the lung bases. Right lateral costophrenic angle is obscured by overlying monitoring electrodes however, there is probable layering trace right pleural effusion. No pneumothorax. Stable cardio-mediastinal silhouette. No acute osseous abnormalities. The soft tissues are within normal limits. Right-sided CT Port-A-Cath is seen with its tip overlying the cavoatrial junction region. IMPRESSION: 1. Small-to-moderate layering left pleural effusion. 2. Superimposed opacity overlying the left upper mid lung zone, slightly increased in size and density when compared to the prior exam dating back to 03/02/2021. Recommend additional imaging to exclude slow growing neoplastic process, if not previously performed. Electronically Signed   By: Jules Schick M.D.   On: 11/30/2022 16:11   CT Head Wo Contrast  Result Date: 11/30/2022 CLINICAL DATA:  Right-sided gaze and dysphagia for 3 days EXAM: CT HEAD WITHOUT CONTRAST TECHNIQUE: Contiguous axial images were obtained from the base of the skull through the vertex without intravenous contrast. RADIATION DOSE REDUCTION: This exam was performed according to the departmental dose-optimization program which includes automated exposure control, adjustment of the mA and/or kV according to patient size and/or use of iterative reconstruction technique. COMPARISON:  CT head 02/03/2021 FINDINGS: Brain: There is no acute intracranial hemorrhage, extra-axial fluid collection, or acute infarct. Parenchymal volume is within expected limits for age. The ventricles are stable in size. Gray-white differentiation is preserved. Patchy hypodensity in the supratentorial white matter likely reflects underlying chronic small-vessel ischemic change. The pituitary and  suprasellar region are normal. There is no mass lesion. There is no mass effect or midline shift. Vascular: There is calcification of the bilateral carotid siphons. Skull: Normal. Negative for fracture or focal lesion. Sinuses/Orbits: The imaged paranasal sinuses are clear. Bilateral lens implants are in place. The globes and orbits are otherwise unremarkable. Other: The mastoid air cells and middle ear cavities are clear. IMPRESSION: No acute intracranial pathology. Electronically Signed   By: Lesia Hausen M.D.   On: 11/30/2022 15:47    Pending Labs Unresulted Labs (From admission, onward)     Start     Ordered   12/01/22 0500  Basic metabolic panel  Tomorrow morning,   R        11/30/22 1715   12/01/22 0500  CBC  Tomorrow morning,   R        11/30/22 1715   11/30/22 1532  Urine Culture  Add-on,   AD       Question:  Indication  Answer:  Altered mental status (if no other cause identified)   11/30/22 1531            Vitals/Pain Today's Vitals   11/30/22 1612 11/30/22 1815 11/30/22 2152  11/30/22 2157  BP: (!) 111/57 104/64 116/62   Pulse: 85 87 90   Resp: 12 11 13    Temp: (!) 97.4 F (36.3 C)   (!) 97.5 F (36.4 C)  TempSrc: Axillary   Axillary  SpO2: 100% 100% 100%     Isolation Precautions No active isolations  Medications Medications  dextrose 50 % solution (  Not Given 11/30/22 1339)  acetaminophen (TYLENOL) tablet 650 mg (has no administration in time range)  atorvastatin (LIPITOR) tablet 10 mg (10 mg Oral Not Given 11/30/22 1759)  pantoprazole (PROTONIX) EC tablet 40 mg (40 mg Oral Not Given 11/30/22 1759)  ferrous sulfate tablet 325 mg (325 mg Oral Not Given 11/30/22 2200)  levETIRAcetam (KEPPRA) tablet 500 mg (500 mg Oral Not Given 11/30/22 2201)  heparin injection 5,000 Units (5,000 Units Subcutaneous Given 11/30/22 2152)  polyethylene glycol (MIRALAX / GLYCOLAX) packet 17 g (has no administration in time range)  dextrose 5 % solution ( Intravenous New Bag/Given  11/30/22 1756)  morphine 10 MG/5ML solution 5 mg (has no administration in time range)  morphine (PF) 2 MG/ML injection 2 mg (2 mg Intravenous Given 11/30/22 2201)  feeding supplement (BOOST / RESOURCE BREEZE) liquid 1 Container (1 Container Oral Not Given 11/30/22 2115)  dextrose 50 % solution 50 mL (50 mLs Intravenous Given 11/30/22 1229)  lactated ringers bolus 500 mL (0 mLs Intravenous Stopped 11/30/22 1458)  levETIRAcetam (KEPPRA) IVPB 1500 mg/ 100 mL premix (0 mg Intravenous Stopped 11/30/22 1519)    Mobility non-ambulatory     Focused Assessments    R Recommendations: See Admitting Provider Note  Report given to:   Additional Notes: Patient is not alert or oriented, SLP will see to eval for swallowing. Patient arrived with ground pills in her mouth, after not eating for 3 days. Patient is not following commands, not eating, not talking. She is only groaning and grimacing, given 2mg  morphine to help with pain. Otherwise family is at bedside and aware that patient is hospice and will transition hopefully to inpatient hospice upon discharge but no plans have been made specifically that I am aware of.

## 2022-11-30 NOTE — Procedures (Addendum)
Patient Name: [name]  MRN: [id number]  Epilepsy Attending: Charlsie Quest  Referring Physician/Provider: Rejeana Brock, MD  Date: 11/30/2022 Duration: 22.19 mins  Patient history: 82 y.o. female with progressive encephalopathy over the past few days. EEG to evaluate for seizure  Level of alertness: comatose/ lethargic   AEDs during EEG study: LEV  Technical aspects: This EEG study was done with scalp electrodes positioned according to the 10-20 International system of electrode placement. Electrical activity was reviewed with band pass filter of 1-70Hz , sensitivity of 7 uV/mm, display speed of 23mm/sec with a 60Hz  notched filter applied as appropriate. EEG data were recorded continuously and digitally stored.  Video monitoring was available and reviewed as appropriate.  Description: EEG showed continuous generalized 3 to 5 Hz theta-delta slowing. Hyperventilation and photic stimulation were not performed.     ABNORMALITY - Continuous slow, generalized  IMPRESSION: This study is suggestive of moderate to severe diffuse encephalopathy. No seizures or epileptiform discharges were seen throughout the recording.  Dayson Aboud Annabelle Harman

## 2022-11-30 NOTE — ED Notes (Signed)
Patient not alert and unable to follow commands, not appropriate for PO meds.

## 2022-11-30 NOTE — Progress Notes (Signed)
[facility] [address] 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    [facility] will continue to follow for any discharge planning needs and to coordinate continuation of hospice care.   Please don't hesitate to call with any Hospice related questions or concerns.    Thank you for the opportunity to participate in this patient's care. Thea Gist, Charity fundraiser, BSN [facility] Liaison [phone]

## 2022-11-30 NOTE — Consult Note (Signed)
NEUROLOGY CONSULT NOTE   Date of service: November 30, 2022 Patient Name: [name] MRN:  [id number] DOB:  [birth year] Chief Complaint: "AMS" Requesting Provider: Laurence Spates, MD  History of Present Illness  [name] is a 82 y.o. female  has a past medical history of Chronic anemia, Chronic diastolic CHF (congestive heart failure) ([name] (10/03/2013), Chronic GI bleeding, Family history of anesthesia complication, Frequent nosebleeds, Gastric AV malformation, GERD (gastroesophageal reflux disease), Heart murmur (04/01/2017), Hematemesis (12/04/2017), History of blood transfusion ("several"), History of epistaxis, HTN (hypertension), benign (03/02/2012), Hyperlipidemia, Iron deficiency anemia, Lichen planus, Osler-Weber-Rendu syndrome ([name], Overgrown toenails (03/18/2017), Pneumonia (1990's X 2), Pulmonary HTN ([name] (04/01/2017), Seizures ([name] (09/2014), Symptomatic anemia (11/29/2014), Telangiectasia, and Type II diabetes mellitus ([name]. who presents with altered mental status.  She has apparently been off for the past few days, but worsened today.  She has a history of seizures, and has been having progressive decline over the past few months.  In June she was noted to have a FAST score of 6D.    Past History   Past Medical History:  Diagnosis Date   Chronic anemia    Chronic diastolic CHF (congestive heart failure) ([name] 10/03/2013   Chronic GI bleeding    /notes 11/29/2014   Family history of anesthesia complication    "niece has a hard time coming out" (09/15/2012)   Frequent nosebleeds    chronic   Gastric AV malformation    /notes 11/29/2014   GERD (gastroesophageal reflux disease)    Heart murmur 04/01/2017   Moderate AVSC on echo 09/2016   Hematemesis 12/04/2017   History of blood transfusion "several"   History of epistaxis    HTN (hypertension), benign 03/02/2012   Hyperlipidemia    Iron deficiency anemia    chronic infusions"   Lichen planus    Both lower extremities    Osler-Weber-Rendu syndrome ([name]    [name] 11/29/2014   Overgrown toenails 03/18/2017   Pneumonia 1990's X 2   Pulmonary HTN ([name] 04/01/2017   PASP on echo 09/2016 and by echo 2019   Seizures ([name] 09/2014   Symptomatic anemia 11/29/2014   Telangiectasia    Gastric    Type II diabetes mellitus (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    Past Surgical History:  Procedure Laterality Date   CATARACT EXTRACTION     "I think it was just one eye"   ESOPHAGOGASTRODUODENOSCOPY  02/26/2011   Procedure: ESOPHAGOGASTRODUODENOSCOPY (EGD);  Surgeon: Barrie Folk, MD;  Location: [facility] ENDOSCOPY;  Service: Endoscopy;  Laterality: N/A;   ESOPHAGOGASTRODUODENOSCOPY N/A 11/08/2012   Procedure: ESOPHAGOGASTRODUODENOSCOPY (EGD);  Surgeon: Theda Belfast, MD;  Location: [facility] ENDOSCOPY;  Service: Endoscopy;  Laterality: N/A;   ESOPHAGOGASTRODUODENOSCOPY N/A 10/04/2013   Procedure: ESOPHAGOGASTRODUODENOSCOPY (EGD);  Surgeon: Vertell Novak., MD;  Location: [facility] ENDOSCOPY;  Service: Endoscopy;  Laterality: N/A;  with APC on stand-by   ESOPHAGOGASTRODUODENOSCOPY N/A 07/06/2014   Procedure: ESOPHAGOGASTRODUODENOSCOPY (EGD);  Surgeon: Vida Rigger, MD;  Location: [facility] ENDOSCOPY;  Service: Endoscopy;  Laterality: N/A;   ESOPHAGOGASTRODUODENOSCOPY N/A 09/05/2014   Procedure: ESOPHAGOGASTRODUODENOSCOPY (EGD);  Surgeon: Carman Ching, MD;  Location: [facility]OS[facility];  Service: Endoscopy;  Laterality: N/A;  APC on standby to control bleeding   ESOPHAGOGASTRODUODENOSCOPY N/A 11/29/2014   Procedure: ESOPHAGOGASTRODUODENOSCOPY (EGD);  Surgeon: Charlott Rakes, MD;  Location[facility] ENDOSCOPY;  Service: Endoscopy;  Laterality: N/A;   ESOPHAGOGASTRODUODENOSCOPY N/A 09/28/2015   Procedure: ESOPHAGOGASTRODUODENOSCOPY (EGD);  Surgeon: Vida Rigger, MD;  Location: [facility] ENDOSCOPY;  Service: Endoscopy;  Laterality: N/A;   ESOPHAGOGASTRODUODENOSCOPY N/A 11/22/2021   Procedure: ESOPHAGOGASTRODUODENOSCOPY (EGD);  Surgeon: Lynann Bologna, DO;   Location: [facility] ENDOSCOPY;  Service: Gastroenterology;  Laterality: N/A;   ESOPHAGOGASTRODUODENOSCOPY (EGD) WITH PROPOFOL N/A 12/04/2017   Procedure: ESOPHAGOGASTRODUODENOSCOPY (EGD) WITH PROPOFOL;  Surgeon: Charlott Rakes, MD;  Location: [facility] ENDOSCOPY;  Service: Endoscopy;  Laterality: N/A;   ESOPHAGOGASTRODUODENOSCOPY (EGD) WITH PROPOFOL N/A 12/24/2020   Procedure: ESOPHAGOGASTRODUODENOSCOPY (EGD) WITH PROPOFOL;  Surgeon: Willis Modena, MD;  Location: [facility] ENDOSCOPY;  Service: Endoscopy;  Laterality: N/A;   ESOPHAGOGASTRODUODENOSCOPY (EGD) WITH PROPOFOL N/A 01/19/2021   Procedure: ESOPHAGOGASTRODUODENOSCOPY (EGD) WITH PROPOFOL;  Surgeon: Charlott Rakes, MD;  Location: [facility] ENDOSCOPY;  Service: Endoscopy;  Laterality: N/A;   ESOPHAGOGASTRODUODENOSCOPY (EGD) WITH PROPOFOL N/A 02/06/2021   Procedure: ESOPHAGOGASTRODUODENOSCOPY (EGD) WITH PROPOFOL;  Surgeon: Kerin Salen, MD;  Location: [facility] ENDOSCOPY;  Service: Gastroenterology;  Laterality: N/A;   ESOPHAGOGASTRODUODENOSCOPY ENDOSCOPY  08/19/2006   with laser treatment   HEMOSTASIS CLIP PLACEMENT  12/24/2020   Procedure: HEMOSTASIS CLIP PLACEMENT;  Surgeon: Willis Modena, MD;  Location: [facility] ENDOSCOPY;  Service: Endoscopy;;   HEMOSTASIS CLIP PLACEMENT  01/19/2021   Procedure: HEMOSTASIS CLIP PLACEMENT;  Surgeon: Charlott Rakes, MD;  Location: [facility] ENDOSCOPY;  Service: Endoscopy;;   HEMOSTASIS CLIP PLACEMENT  11/22/2021   Procedure: HEMOSTASIS CLIP PLACEMENT;  Surgeon: Lynann Bologna, DO;  Location: [name];  Service: Gastroenterology;;   HOT HEMOSTASIS N/A 07/06/2014   Procedure: HOT HEMOSTASIS (ARGON PLASMA COAGULATION/BICAP);  Surgeon: Vida Rigger, MD;  Location: [facility] ENDOSCOPY;  Service: Endoscopy;  Laterality: N/A;   HOT HEMOSTASIS N/A 09/28/2015   Procedure: HOT HEMOSTASIS (ARGON PLASMA COAGULATION/BICAP);  Surgeon: Vida Rigger, MD;  Location: [facility] ENDOSCOPY;  Service: Endoscopy;  Laterality: N/A;   HOT HEMOSTASIS N/A 12/04/2017   Procedure:  HOT HEMOSTASIS (ARGON PLASMA COAGULATION/BICAP);  Surgeon: Charlott Rakes, MD;  Location: [facility] ENDOSCOPY;  Service: Endoscopy;  Laterality: N/A;   HOT HEMOSTASIS  11/22/2021   Procedure: HOT HEMOSTASIS (ARGON PLASMA COAGULATION/BICAP);  Surgeon: Lynann Bologna, DO;  Location: [facility] ENDOSCOPY;  Service: Gastroenterology;;   [address] SELECTIVE EACH ADDITIONAL VESSEL  02/05/2021   IR ANGIOGRAM VISCERAL SELECTIVE  02/05/2021   IR EMBO ART  VEN HEMORR LYMPH EXTRAV  INC GUIDE ROADMAPPING  02/05/2021   IR US GUIDE VASC ACCESS RIGHT  02/05/2021   NASAL HEMORRHAGE CONTROL     "for bleeding"    SAVORY DILATION  02/26/2011   Procedure: SAVORY DILATION;  Surgeon: Barrie Folk, MD;  Location: [name];  Service: Endoscopy;  Laterality: N/A;  c-arm needed   SCLEROTHERAPY  12/24/2020   Procedure: SCLEROTHERAPY;  Surgeon: Willis Modena, MD;  Location: [facility] ENDOSCOPY;  Service: Endoscopy;;   SCLEROTHERAPY  01/19/2021   Procedure: Susa Day;  Surgeon: Charlott Rakes, MD;  Location: [facility] ENDOSCOPY;  Service: Endoscopy;;   SUBMUCOSAL INJECTION  12/04/2017   Procedure: SUBMUCOSAL INJECTION;  Surgeon: Charlott Rakes, MD;  Location: [facility] ENDOSCOPY;  Service: Endoscopy;;    Family History: Family History  Problem Relation Age of Onset   Healthy Mother    Stroke Father    Diabetes Niece    Breast cancer Other    Malignant hyperthermia Neg Hx    Seizures Neg Hx    Lung cancer Neg Hx     Social History  reports that she quit smoking about 51 years ago. Her smoking use included cigarettes. She started smoking about 71 years ago. She has a 20 pack-year smoking history. She has never used smokeless tobacco. She  reports that she does not drink alcohol and does not use drugs.  Allergies  Allergen Reactions   Asa [Aspirin] Nausea And Vomiting    Medications   Current Facility-Administered Medications:    dextrose 50 % solution, , , ,   Current Outpatient Medications:    acetaminophen  (TYLENOL) 325 MG tablet, Take 650 mg by mouth every 4 (four) hours as needed for mild pain., Disp: , Rfl:    atorvastatin (LIPITOR) 10 MG tablet, Take 10 mg by mouth daily., Disp: , Rfl:    carvedilol (COREG) 3.125 MG tablet, Take 3.125 mg by mouth 2 (two) times daily., Disp: , Rfl:    ferrous sulfate 325 (65 FE) MG tablet, Take 325 mg by mouth 2 (two) times daily., Disp: , Rfl:    gabapentin (NEURONTIN) 100 MG capsule, Take 1 capsule (100 mg total) by mouth in the morning AND 2 capsules (200 mg total) at bedtime. (Patient taking differently: Give 100 mg (1 capsule) in the morning, 200 mg (2 capsules) in the evening.), Disp: , Rfl:    Insulin Glargine (BASAGLAR KWIKPEN) 100 UNIT/ML, Inject 8 Units into the skin daily., Disp: , Rfl:    lactulose (CHRONULAC) 10 GM/15ML solution, Take 10 g by mouth 3 (three) times daily., Disp: , Rfl:    levETIRAcetam (KEPPRA) 500 MG tablet, Take 1 tablet (500 mg total) by mouth 2 (two) times daily., Disp: , Rfl:    mirtazapine (REMERON) 7.5 MG tablet, Take 7.5 mg by mouth at bedtime., Disp: , Rfl:    morphine (ROXANOL) 20 MG/ML concentrated solution, Take 5 mg by mouth every 6 (six) hours as needed (pain)., Disp: , Rfl:    Nutritional Supplement LIQD, Take 120 mLs by mouth 2 (two) times daily between meals., Disp: , Rfl:    Nutritional Supplements (NUTRITIONAL SUPPLEMENT PO), Take 1 each by mouth See admin instructions. Give 1 frozen nutritional treat with lunch and dinner., Disp: , Rfl:    omeprazole (PRILOSEC) 40 MG capsule, Take 40 mg by mouth 2 (two) times daily., Disp: , Rfl:    oxymetazoline (AFRIN) 0.05 % nasal spray, Place 2 sprays into both nostrils every 4 (four) hours as needed (nose bleeds)., Disp: , Rfl:    PROTEIN PO, Take 30 mLs by mouth daily. Liquid protein, Disp: , Rfl:    sodium chloride (OCEAN) 0.65 % SOLN nasal spray, Place 2 sprays into both nostrils every 4 (four) hours as needed (nasal dryness)., Disp: , Rfl:    torsemide (DEMADEX) 20 MG tablet,  Take 1 tablet (20 mg total) by mouth as needed (please take 1 20mg  tablet as needed for weight gain of 2 pounds overnight or 5 pounds in a week). (Patient taking differently: Take 20 mg by mouth daily.), Disp: 180 tablet, Rfl: 3   traMADol (ULTRAM) 50 MG tablet, Take 50 mg by mouth 2 (two) times daily., Disp: , Rfl:    triamcinolone ointment (KENALOG) 0.5 %, Apply 1 Application topically every 12 (twelve) hours as needed (itching)., Disp: , Rfl:    TRULICITY 3 MG/0.5ML SOPN, Inject 3 mg into the skin once a week. (Patient taking differently: Inject 1.5 mg into the skin every Thursday.), Disp: 2 mL, Rfl: 2  Facility-Administered Medications Ordered in Other Encounters:    sodium chloride 0.9 % injection 10 mL, 10 mL, Intracatheter, PRN, Malachy Mood, MD   sodium chloride flush (NS) 0.9 % injection 10 mL, 10 mL, Intracatheter, PRN, Malachy Mood, MD, 10 mL at 08/18/19 1717  Vitals  Vitals:   11/30/22 1159  BP: 132/61  Pulse: 95  Resp: (!) 22  Temp: 98.2 F (36.8 C)  TempSrc: Axillary  SpO2: 100%    There is no height or weight on file to calculate BMI.  Physical Exam   Constitutional: Appears well-developed and well-nourished.  Neurologic Examination    Physical Exam  Constitutional: Appears elderly  Neuro: Mental Status: Patient does not open eyes, but when attempting to open her eyes she keeps them tightly shut.  She grimaces and groans, but does not follow commands. Cranial Nerves: II: She forcefully attempts to keep her eye closed with any attempted opening, making blink to threat difficult to check.  Both pupils are reactive, but checking them simultaneously was not possible due to her keeping him tightly closed III,IV, VI: She appears to possibly have a right gaze preference, though she does cross midline with VOR V, VII: Grimaces relatively symmetrically Motor: She moves all extremities to noxious stimulation, but does not cooperate with formal testing.  sensory: As above   Cerebellar: Does not perform       Labs   CBC:  Recent Labs  Lab 11/30/22 1232 11/30/22 1249  WBC  --  11.6*  NEUTROABS  --  9.4*  HGB 8.8* 8.2*  HCT 26.0* 27.4*  MCV  --  100.7*  PLT  --  190    Basic Metabolic Panel:  Lab Results  Component Value Date   NA 151 (H) 11/30/2022   K 3.8 11/30/2022   CO2 31 11/30/2022   GLUCOSE 69 (L) 11/30/2022   BUN 20 11/30/2022   CREATININE 1.22 (H) 11/30/2022   CALCIUM 8.0 (L) 11/30/2022   GFRNONAA 44 (L) 11/30/2022   GFRAA 33 (L) 11/10/2019   Lipid Panel:  Lab Results  Component Value Date   LDLCALC 57 01/30/2019   HgbA1c:  Lab Results  Component Value Date   HGBA1C 5.8 (A) 04/23/2022   Urine Drug Screen:     Component Value Date/Time   LABOPIA NONE DETECTED 10/02/2014 2307   COCAINSCRNUR NONE DETECTED 10/02/2014 2307   LABBENZ NONE DETECTED 10/02/2014 2307   AMPHETMU NONE DETECTED 10/02/2014 2307   THCU NONE DETECTED 10/02/2014 2307   LABBARB NONE DETECTED 10/02/2014 2307    Alcohol Level     Component Value Date/Time   ETH <10 02/03/2021 1125   INR  Lab Results  Component Value Date   INR 1.2 11/20/2021   APTT  Lab Results  Component Value Date   APTT 30 01/18/2021   AED levels:  Lab Results  Component Value Date   LEVETIRACETA 15.5 01/29/2021     CT Head without contrast(Personally reviewed): Negative  Impression   [name] is a 82 y.o. female with progressive encephalopathy over the past few days.  She does have a focal finding of right gaze, but unclear if she has any baseline deficits.  She is enrolled in hospice at baseline, and apparently has been declining over the past few months (e.g. worsened since her FAST of 6d was noted).    Her mental status is likely multifactorial given the progressive decline described, however with a history of seizure this is a consideration.  It is unclear how long she was severely hypoglycemic, but with a glucose of 32 for a prolonged period of  time, certainly this could contribute to persistent altered mental status.  ED physician is planning to discuss with family, but if aggressive care is desired I would favor getting an MRI,  EEG as well as ruling other possible metabolic contributors.  Recommendations  1) ammonia, TSH, B12, UA 2) MRI brain 3) EEG 4) correct any metabolic derangements that are found 5) neurology will follow    ______________________________________________________________________    Signed,  Ritta Slot

## 2022-11-30 NOTE — ED Notes (Signed)
Pt transported upstairs to assigned unit on telemetry and 2L O2 via n/c accompanied by tech and family. Belongings sent with pt.

## 2022-12-01 DIAGNOSIS — R4182 Altered mental status, unspecified: Secondary | ICD-10-CM | POA: Diagnosis not present

## 2022-12-01 LAB — CBC
HCT: 24.2 % — ABNORMAL LOW (ref 36.0–46.0)
Hemoglobin: 7.3 g/dL — ABNORMAL LOW (ref 12.0–15.0)
MCH: 29.9 pg (ref 26.0–34.0)
MCHC: 30.2 g/dL (ref 30.0–36.0)
MCV: 99.2 fL (ref 80.0–100.0)
Platelets: 158 10*3/uL (ref 150–400)
RBC: 2.44 MIL/uL — ABNORMAL LOW (ref 3.87–5.11)
RDW: 16.2 % — ABNORMAL HIGH (ref 11.5–15.5)
WBC: 8.4 10*3/uL (ref 4.0–10.5)
nRBC: 0 % (ref 0.0–0.2)

## 2022-12-01 LAB — BASIC METABOLIC PANEL
Anion gap: 10 (ref 5–15)
BUN: 22 mg/dL (ref 8–23)
CO2: 31 mmol/L (ref 22–32)
Calcium: 7.8 mg/dL — ABNORMAL LOW (ref 8.9–10.3)
Chloride: 109 mmol/L (ref 98–111)
Creatinine, Ser: 1.22 mg/dL — ABNORMAL HIGH (ref 0.44–1.00)
GFR, Estimated: 44 mL/min — ABNORMAL LOW (ref >60)
Glucose, Bld: 179 mg/dL — ABNORMAL HIGH (ref 70–99)
Potassium: 3.5 mmol/L (ref 3.5–5.1)
Sodium: 150 mmol/L — ABNORMAL HIGH (ref 135–145)

## 2022-12-01 LAB — AMMONIA: Ammonia: 81 umol/L — ABNORMAL HIGH (ref 9–35)

## 2022-12-01 MED ORDER — LEVETIRACETAM IN NACL 500 MG/100ML IV SOLN
500.0000 mg | Freq: Two times a day (BID) | INTRAVENOUS | Status: DC
  Administered 2022-12-01 – 2022-12-02 (×2): 500 mg via INTRAVENOUS
  Filled 2022-12-01 (×3): qty 100

## 2022-12-01 MED ORDER — DEXTROSE 5 % IV SOLN: INTRAVENOUS | Status: DC

## 2022-12-01 MED ORDER — LACTULOSE ENEMA
300.0000 mL | Freq: Once | ORAL | Status: AC
  Administered 2022-12-01: 300 mL via RECTAL
  Filled 2022-12-01: qty 300

## 2022-12-01 NOTE — Progress Notes (Signed)
Pt unable to take PO meds. Charted not given

## 2022-12-01 NOTE — Progress Notes (Signed)
NEUROLOGY CONSULT FOLLOW UP NOTE   Date of service: December 01, 2022 Patient Name: [name] MRN:  [id number] DOB:  February 01, 1941  Brief HPI  [name] is a 82 y.o. female who presented with  AMS in the settin gof advanced dementia   Interval Hx/subjective   No significant changes Vitals   Vitals:   12/01/22 0440 12/01/22 0800 12/01/22 0841 12/01/22 0930  BP: 104/77 (!) 150/86 (!) 150/86 (!) 148/82  Pulse: 80 69 69 78  Resp: 18  17 15   Temp: (!) 97.4 F (36.3 C) (!) 95 F (35 C)  97.6 F (36.4 C)  TempSrc: Oral Axillary    SpO2: 94% (!) 76% (!) 76% 100%     There is no height or weight on file to calculate BMI.  Physical Exam   General: In bed, resting  Neurologic Examination   MS: Keeps eyes tightly shut, does not follow commands, mumbles incomprehensible phrases CN: Appears to have a right gaze preference, but does come back towards midline, face is relatively symmetric Motor: She withdraws to noxious stimulation in all four extremities Sensory: As above  Labs and Diagnostic Imaging   CBC:  Recent Labs  Lab 11/30/22 1249 12/01/22 0831  WBC 11.6* 8.4  NEUTROABS 9.4*  --   HGB 8.2* 7.3*  HCT 27.4* 24.2*  MCV 100.7* 99.2  PLT 190 158    Basic Metabolic Panel:  Lab Results  Component Value Date   NA 150 (H) 12/01/2022   K 3.5 12/01/2022   CO2 31 12/01/2022   GLUCOSE 179 (H) 12/01/2022   BUN 22 12/01/2022   CREATININE 1.22 (H) 12/01/2022   CALCIUM 7.8 (L) 12/01/2022   GFRNONAA 44 (L) 12/01/2022   GFRAA 33 (L) 11/10/2019   Lipid Panel:  Lab Results  Component Value Date   LDLCALC 57 01/30/2019   HgbA1c:  Lab Results  Component Value Date   HGBA1C 5.8 (A) 04/23/2022   Urine Drug Screen:     Component Value Date/Time   LABOPIA NONE DETECTED 10/02/2014 2307   COCAINSCRNUR NONE DETECTED 10/02/2014 2307   LABBENZ NONE DETECTED 10/02/2014 2307   AMPHETMU NONE DETECTED 10/02/2014 2307   THCU NONE DETECTED 10/02/2014 2307   LABBARB NONE  DETECTED 10/02/2014 2307    Alcohol Level     Component Value Date/Time   ETH <10 02/03/2021 1125   INR  Lab Results  Component Value Date   INR 1.2 11/20/2021   APTT  Lab Results  Component Value Date   APTT 30 01/18/2021    Impression   [name] is a 82 y.o. female with progressive encephalopathy in the setting of advanced dementia.  She has had a steady decline since her last evaluation where she was noted to be fast of 6d.  She will only say a few words over the course of the day, is unable to walk, or sit unsupported.  In this setting, she has mostly stopped eating and taking medications.  I suspect this is progressive decline and family is indicating the wish to pursue inpatient hospice.  Recommendations  Agree with palliative measures Could continue IV Keppra while she is here, could consider trialing liquid form once discharged. Neurology will be available as needed. ______________________________________________________________________   Thank you for the opportunity to take part in the care of this patient. If you have any further questions, please contact the neurology consultation team on call. Updated oncall schedule is listed on AMION.  Signed,  Ritta Slot,  MD [facility] [phone]  If 7pm- 7am, please page neurology on call as listed in AMION.

## 2022-12-01 NOTE — Progress Notes (Addendum)
PROGRESS NOTE  [name]  ZOX:[id number] DOB: [birth year] DOA: 11/30/2022 PCP: Laretta Bolster, MD   Brief Narrative: Patient is a 82 y.o. female with medical history significant of advanced dementia,lung cancer, type 2 diabetes, diastolic CHF, hypertension, hyperlipidemia, seizure disorder who was brought to the emergency room for complaint of increased confusion from baseline.  As per the report, she was having 3-day history of increased confusion, difficulty in swallowing, right-sided gaze deviation.  She had significant decline since last few months.  Patient is currently enrolled with hospice as an outpatient but remains full code.  Goals of care discussed with family in the emergency department and CODE STATUS changed to DNR.  Her quality of life is very poor.  She has poor prognosis.  She does not have any oral intake, hypoglycemic.  Palliative care consulted for goals of care.  She is appropriate for residential hospice.  Assessment & Plan:  Principal Problem:   AMS (altered mental status) Active Problems:   Type 2 diabetes mellitus ([name]   Chronic diastolic CHF (congestive heart failure) ([name]   Essential hypertension   CKD (chronic kidney disease), stage III ([name]   Dementia ([name]   PAF (paroxysmal atrial fibrillation) ([name]  Altered mental status on the background of dementia: Unclear etiology but likely metabolic encephalopathy.  Patient is confused at baseline but usually she talks and eats.  Noticed to have decreased oral intake last few days, does not talk.  Has been noticed to have gradual decline in last few months.  UA not suspicious for UTI. Sodium level of 151.  Hypernatremia/dehydration could have contributed.  CT head did not show any acute findings. EEG did not show any seizures but showed diffuse encephalopathy.  MRI did not show any acute findings. TSH level , vitamin B12 not low. Ammonia level high, unclear etiology.  Since he cannot swallow, ordered lactulose  enema.  Goals of care: Elderly patient with multiple comorbidities, baseline dementia.  Confused at baseline.  Presented with more confusion, poor oral intake.  Patient has very poor quality of life.  She is apparently she is involved with hospice as an outpatient but still remains full code.  Consulted  palliative care. Had a long discussion with her she is at bedside and also talked to her husband at the same time.  We decided to change her full CODE STATUS to DNR. She is appropriate for  residential hospice.   Hypernatremia/ CKD stage IIIa: On reviewing her previous records, her baseline creatinine is 1.2-1.3.  Last creatinine was 0.9 on 03/16/2022.  Currently kidney function at baseline.  BUN not elevated.  But may be dehydrated due to poor oral intake.  Sodium of 151. Started on D5 W.  Patient is also hypoglycemic.   Type 2 diabetes: Apparently she takes insulin as per her med rec.  Patient is hypoglycemic.  Continue D5W.  Stop insulin   Leucocytosis: This is most likely reactive,resolved.  Continue to hold antibiotics   Hypertension: Takes carvedilol at home.  Currently on hold.  Blood pressure stable.   History of seizure: Takes Keppra.  Not sure if she had seizure episode.  Continue Keppra at current dose but she is unable to take anything by mouth..  EEG did not show any seizure.   Hx of diastolic CHF: Takes Demadex as needed.  Last echo showed EF of 70 to 75%, hyperdynamic function, diastolic function could not be evaluated .  Normocytic anemia: This is likely chronic.  Hemoglobin currently in the range  of 7.  No evidence of acute blood loss   History of hyperlipidemia: She was on  on Lipitor.       Pressure Injury 12/01/22 Sacrum Mid Stage 1 -  Intact skin with non-blanchable redness of a localized area usually over a bony prominence. (Active)  12/01/22 0040  Location: Sacrum  Location Orientation: Mid  Staging: Stage 1 -  Intact skin with non-blanchable redness of a localized  area usually over a bony prominence.  Wound Description (Comments):   Present on Admission: Yes  Dressing Type Foam - Lift dressing to assess site every shift 12/01/22 0017    DVT prophylaxis:heparin injection 5,000 Units Start: 11/30/22 1730     Code Status: Limited: Do not attempt resuscitation (DNR) -DNR-LIMITED -Do Not Intubate/DNI   Family Communication: called and discussed with niece Angelique Blonder  on phone on 10/22  Patient status:obs  Patient is from :SNF  Anticipated discharge to: SNF with hospice  Estimated DC date: tomorrow   Consultants: Palliative care, neurology  Procedures: None  Antimicrobials:  Anti-infectives (From admission, onward)    None       Subjective: Patient seen and examined at bedside today.  Hemodynamically stable.  She remains encephalopathic.  Eyes closed.  Did not open her eye on calling  her name but she was moaning.  She was moving her extremities.  Objective: Vitals:   12/01/22 0440 12/01/22 0800 12/01/22 0841 12/01/22 0930  BP: 104/77 (!) 150/86 (!) 150/86 (!) 148/82  Pulse: 80 69 69 78  Resp: 18  17 15   Temp: (!) 97.4 F (36.3 C) (!) 95 F (35 C)  97.6 F (36.4 C)  TempSrc: Oral Axillary    SpO2: 94% (!) 76% (!) 76% 100%    Intake/Output Summary (Last 24 hours) at 12/01/2022 1224 Last data filed at 12/01/2022 0300 Gross per 24 hour  Intake 710.18 ml  Output --  Net 710.18 ml   There were no vitals filed for this visit.  Examination:  General exam: Not in distress, encephalopathy, severely deconditioned, weak HEENT: Eyes closed Respiratory system:  no wheezes or crackles  Cardiovascular system: S1 & S2 heard, RRR.  Gastrointestinal system: Abdomen is nondistended, soft and nontender. Central nervous system: Not awake, alert  or oriented Extremities: No edema, no clubbing ,no cyanosis Skin: No rashes, no ulcers,no icterus     Data Reviewed: I have personally reviewed following labs and imaging  studies  CBC: Recent Labs  Lab 11/30/22 1230 11/30/22 1232 11/30/22 1249 12/01/22 0831  WBC  --   --  11.6* 8.4  NEUTROABS  --   --  9.4*  --   HGB 8.8* 8.8* 8.2* 7.3*  HCT 26.0* 26.0* 27.4* 24.2*  MCV  --   --  100.7* 99.2  PLT  --   --  190 158   Basic Metabolic Panel: Recent Labs  Lab 11/30/22 1230 11/30/22 1232 11/30/22 1249 12/01/22 0831  NA 149* 149* 151* 150*  K 3.8 3.8 3.8 3.5  CL  --  108 111 109  CO2  --   --  31 31  GLUCOSE  --  52* 69* 179*  BUN  --  23 20 22   CREATININE  --  1.30* 1.22* 1.22*  CALCIUM  --   --  8.0* 7.8*     Recent Results (from the past 240 hour(s))  Resp panel by RT-PCR (RSV, Flu A&B, Covid) Anterior Nasal Swab     Status: None   Collection Time: 11/30/22 12:50  PM   Specimen: Anterior Nasal Swab  Result Value Ref Range Status   SARS Coronavirus 2 by RT PCR NEGATIVE NEGATIVE Final   Influenza A by PCR NEGATIVE NEGATIVE Final   Influenza B by PCR NEGATIVE NEGATIVE Final    Comment: (NOTE) The Xpert Xpress SARS-CoV-2/FLU/RSV plus assay is intended as an aid in the diagnosis of influenza from Nasopharyngeal swab specimens and should not be used as a sole basis for treatment. Nasal washings and aspirates are unacceptable for Xpert Xpress SARS-CoV-2/FLU/RSV testing.  Fact Sheet for Patients: BloggerCourse.com  Fact Sheet for Healthcare Providers: SeriousBroker.it  This test is not yet approved or cleared by the [place] FDA and has been authorized for detection and/or diagnosis of SARS-CoV-2 by FDA under an Emergency Use Authorization (EUA). This EUA will remain in effect (meaning this test can be used) for the duration of the COVID-19 declaration under Section 564(b)(1) of the Act, 21 U.S.C. section 360bbb-3(b)(1), unless the authorization is terminated or revoked.     Resp Syncytial Virus by PCR NEGATIVE NEGATIVE Final    Comment: (NOTE) Fact Sheet for  Patients: BloggerCourse.com  Fact Sheet for Healthcare Providers: SeriousBroker.it  This test is not yet approved or cleared by the [place] FDA and has been authorized for detection and/or diagnosis of SARS-CoV-2 by FDA under an Emergency Use Authorization (EUA). This EUA will remain in effect (meaning this test can be used) for the duration of the COVID-19 declaration under Section 564(b)(1) of the Act, 21 U.S.C. section 360bbb-3(b)(1), unless the authorization is terminated or revoked.  Performed at [facility], [address], [place], [place] [address]      Radiology Studies: MR BRAIN WO CONTRAST  Result Date: 11/30/2022 CLINICAL DATA:  Encephalopathy. EXAM: MRI HEAD WITHOUT CONTRAST TECHNIQUE: Multiplanar, multiecho pulse sequences of the brain and surrounding structures were obtained without intravenous contrast. COMPARISON:  Brain MRI 01/29/2021. FINDINGS: Brain: Motion degraded study. Within this limitation, no acute infarct or hemorrhage. Stable moderate chronic small-vessel disease and generalized volume loss. No hydrocephalus or extra-axial collection. No mass or midline shift. Vascular: Normal flow voids. Skull and upper cervical spine: Normal marrow signal. Sinuses/Orbits: No acute findings. Other: None. IMPRESSION: Motion degraded study. Within this limitation, no acute intracranial process. Electronically Signed   By: Orvan Falconer M.D.   On: 11/30/2022 21:57   EEG adult  Result Date: 11/30/2022 Charlsie Quest, MD     11/30/2022  6:41 PM Patient Name: VERALEE GREIL MRN: [id number] Epilepsy Attending: Charlsie Quest Referring Physician/Provider: Rejeana Brock, MD Date: 11/30/2022 Duration: 22.19 mins Patient history: 82 y.o. female with progressive encephalopathy over the past few days. EEG to evaluate for seizure Level of alertness: comatose/ lethargic AEDs during EEG study: LEV Technical aspects:  This EEG study was done with scalp electrodes positioned according to the 10-20 International system of electrode placement. Electrical activity was reviewed with band pass filter of 1-70Hz , sensitivity of 7 uV/mm, display speed of 47mm/sec with a 60Hz  notched filter applied as appropriate. EEG data were recorded continuously and digitally stored.  Video monitoring was available and reviewed as appropriate. Description: EEG showed continuous generalized 3 to 5 Hz theta-delta slowing. Hyperventilation and photic stimulation were not performed.   ABNORMALITY - Continuous slow, generalized IMPRESSION: This study is suggestive of moderate to severe diffuse encephalopathy. No seizures or epileptiform discharges were seen throughout the recording. Charlsie Quest   DG Chest Port 1 View  Result Date: 11/30/2022 CLINICAL DATA:  Altered mental status. EXAM: PORTABLE CHEST 1 VIEW COMPARISON:  07/04/2021. FINDINGS: There is small-to-moderate layering left pleural effusion. There is superimposed approximately 2.1 x 3.2 cm opacity overlying the left upper mid lung zone. The opacity is present since the prior study dating back to 03/02/2021. However, slightly increased in size and density when compared to the prior exam. There is sharply marginated triangular opacity overlying the right mid lung zone, laterally. This may represent consolidation versus atelectasis. There are additional atelectatic changes at the lung bases. Right lateral costophrenic angle is obscured by overlying monitoring electrodes however, there is probable layering trace right pleural effusion. No pneumothorax. Stable cardio-mediastinal silhouette. No acute osseous abnormalities. The soft tissues are within normal limits. Right-sided CT Port-A-Cath is seen with its tip overlying the cavoatrial junction region. IMPRESSION: 1. Small-to-moderate layering left pleural effusion. 2. Superimposed opacity overlying the left upper mid lung zone, slightly  increased in size and density when compared to the prior exam dating back to 03/02/2021. Recommend additional imaging to exclude slow growing neoplastic process, if not previously performed. Electronically Signed   By: Jules Schick M.D.   On: 11/30/2022 16:11   CT Head Wo Contrast  Result Date: 11/30/2022 CLINICAL DATA:  Right-sided gaze and dysphagia for 3 days EXAM: CT HEAD WITHOUT CONTRAST TECHNIQUE: Contiguous axial images were obtained from the base of the skull through the vertex without intravenous contrast. RADIATION DOSE REDUCTION: This exam was performed according to the departmental dose-optimization program which includes automated exposure control, adjustment of the mA and/or kV according to patient size and/or use of iterative reconstruction technique. COMPARISON:  CT head 02/03/2021 FINDINGS: Brain: There is no acute intracranial hemorrhage, extra-axial fluid collection, or acute infarct. Parenchymal volume is within expected limits for age. The ventricles are stable in size. Gray-white differentiation is preserved. Patchy hypodensity in the supratentorial white matter likely reflects underlying chronic small-vessel ischemic change. The pituitary and suprasellar region are normal. There is no mass lesion. There is no mass effect or midline shift. Vascular: There is calcification of the bilateral carotid siphons. Skull: Normal. Negative for fracture or focal lesion. Sinuses/Orbits: The imaged paranasal sinuses are clear. Bilateral lens implants are in place. The globes and orbits are otherwise unremarkable. Other: The mastoid air cells and middle ear cavities are clear. IMPRESSION: No acute intracranial pathology. Electronically Signed   By: Lesia Hausen M.D.   On: 11/30/2022 15:47    Scheduled Meds:  atorvastatin  10 mg Oral Daily   feeding supplement  1 Container Oral TID BM   ferrous sulfate  325 mg Oral BID   heparin  5,000 Units Subcutaneous Q8H   levETIRAcetam  500 mg Oral BID    pantoprazole  40 mg Oral Daily   Continuous Infusions:  dextrose 75 mL/hr at 11/30/22 1756     LOS: 0 days   Burnadette Pop, MD [facility] P10/22/2024, 12:24 PM

## 2022-12-01 NOTE — Social Work (Addendum)
CSW advised that pt is currently a hospice pt with [facility] from [facility]. CSW confirmed with Wadie Lessen that she is LTC with no barriers to returning if that is the recommended disposition. Pt undergoing a medical workup, TOC will follow for additional needs.   12:30 ongoing discussions with Palliative, Hospice, and medical team to appropriate disposition. Palliative to see to confirm GOC. TOC will continue to follow.

## 2022-12-01 NOTE — Evaluation (Signed)
Clinical/Bedside Swallow Evaluation Patient Details  Name: [name] MRN: [id number] Date of Birth: 03-23-1940  Today's Date: 12/01/2022 Time: SLP Start Time (ACUTE ONLY): 0940 SLP Stop Time (ACUTE ONLY): 0957 SLP Time Calculation (min) (ACUTE ONLY): 17 min  Past Medical History:  Past Medical History:  Diagnosis Date   Chronic anemia    Chronic diastolic CHF (congestive heart failure) ([name] 10/03/2013   Chronic GI bleeding    /notes 11/29/2014   Family history of anesthesia complication    "niece has a hard time coming out" (09/15/2012)   Frequent nosebleeds    chronic   Gastric AV malformation    /notes 11/29/2014   GERD (gastroesophageal reflux disease)    Heart murmur 04/01/2017   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   Past Surgical History:  Past Surgical History:  Procedure Laterality Date   CATARACT EXTRACTION     "I think it was just one eye"   ESOPHAGOGASTRODUODENOSCOPY  02/26/2011   Procedure: ESOPHAGOGASTRODUODENOSCOPY (EGD);  Surgeon: Barrie Folk, MD;  Location: [facility]OSCOPY;  Service: Endoscopy;  Laterality: N/A;   ESOPHAGOGASTRODUODENOSCOPY N/A 11/08/2012   Procedure: ESOPHAGOGASTRODUODENOSCOPY (EGD);  Surgeon: Theda Belfast, MD;  Location: [facility]COPY;  Service: Endoscopy;  Laterality: N/A;   ESOPHAGOGASTRODUODENOSCOPY N/A 10/04/2013   Procedure: ESOPHAGOGASTRODUODENOSCOPY (EGD);  Surgeon:  Vertell Novak., MD;  Location: [facility]OS[facility];  Service: Endoscopy;  Laterality: N/A;  with APC on stand-by   ESOPHAGOGASTRODUODENOSCOPY N/A 07/06/2014   Procedure: ESOPHAGOGASTRODUODENOSCOPY (EGD);  Surgeon: Vida Rigger, MD;  Location: [facility];  Service: Endoscopy;  Laterality: N/A;   ESOPHAGOGASTRODUODENOSCOPY N/A 09/05/2014   Procedure: ESOPHAGOGASTRODUODENOSCOPY (EGD);  Surgeon: Carman Ching, MD;  Location: [facility];  Service: Endoscopy;  Laterality: N/A;  APC on standby to control bleeding   ESOPHAGOGASTRODUODENOSCOPY N/A 11/29/2014   Procedure: ESOPHAGOGASTRODUODENOSCOPY (EGD);  Surgeon: Charlott Rakes, MD;  Location: [facility] ENDOSCOPY;  Service: Endoscopy;  Laterality: N/A;   ESOPHAGOGASTRODUODENOSCOPY N/A 09/28/2015   Procedure: ESOPHAGOGASTRODUODENOSCOPY (EGD);  Surgeon: Vida Rigger, MD;  Location: [facility] ENDOSCOPY;  Service: Endoscopy;  Laterality: N/A;   ESOPHAGOGASTRODUODENOSCOPY N/A 11/22/2021   Procedure: ESOPHAGOGASTRODUODENOSCOPY (EGD);  Surgeon: Lynann Bologna, DO;  Location: [facility];  Service: Gastroenterology;  Laterality: N/A;   ESOPHAGOGASTRODUODENOSCOPY (EGD) WITH PROPOFOL N/A 12/04/2017   Procedure: ESOPHAGOGASTRODUODENOSCOPY (EGD) WITH PROPOFOL;  Surgeon: Charlott Rakes, MD;  Location: [facility] ENDOSCOPY;  Service: Endoscopy;  Laterality: N/A;   ESOPHAGOGASTRODUODENOSCOPY (EGD) WITH PROPOFOL N/A 12/24/2020   Procedure: ESOPHAGOGASTRODUODENOSCOPY (EGD) WITH PROPOFOL;  Surgeon: Willis Modena, MD;  Location: [facility] ENDOSCOPY;  Service: Endoscopy;  Laterality: N/A;   ESOPHAGOGASTRODUODENOSCOPY (EGD) WITH PROPOFOL N/A 01/19/2021   Procedure: ESOPHAGOGASTRODUODENOSCOPY (EGD) WITH PROPOFOL;  Surgeon: Charlott Rakes, MD;  Location: [facility] ENDOSCOPY;  Service: Endoscopy;  Laterality: N/A;   ESOPHAGOGASTRODUODENOSCOPY (EGD) WITH PROPOFOL N/A 02/06/2021   Procedure: ESOPHAGOGASTRODUODENOSCOPY (EGD) WITH PROPOFOL;  Surgeon: Kerin Salen, MD;  Location: [facility] ENDOSCOPY;  Service:  Gastroenterology;  Laterality: N/A;   ESOPHAGOGASTRODUODENOSCOPY ENDOSCOPY  08/19/2006   with laser treatment   HEMOSTASIS CLIP PLACEMENT  12/24/2020   Procedure: HEMOSTASIS CLIP PLACEMENT;  Surgeon: Willis Modena, MD;  Location: [facility] ENDOSCOPY;  Service: Endoscopy;;   HEMOSTASIS CLIP PLACEMENT  01/19/2021   Procedure: HEMOSTASIS CLIP PLACEMENT;  Surgeon: Charlott Rakes, MD;  Location: [facility] ENDOSCOPY;  Service: Endoscopy;;   HEMOSTASIS CLIP PLACEMENT  11/22/2021   Procedure: HEMOSTASIS CLIP PLACEMENT;  Surgeon: Lynann Bologna, DO;  Location: [name];  Service: Gastroenterology;;   HOT HEMOSTASIS N/A 07/06/2014   Procedure: HOT HEMOSTASIS (ARGON PLASMA COAGULATION/BICAP);  Surgeon: Vida Rigger, MD;  Location: [facility] ENDOSCOPY;  Service: Endoscopy;  Laterality: N/A;   HOT HEMOSTASIS N/A 09/28/2015   Procedure: HOT HEMOSTASIS (ARGON PLASMA COAGULATION/BICAP);  Surgeon: Vida Rigger, MD;  Location: [facility] ENDOSCOPY;  Service: Endoscopy;  Laterality: N/A;   HOT HEMOSTASIS N/A 12/04/2017   Procedure: HOT HEMOSTASIS (ARGON PLASMA COAGULATION/BICAP);  Surgeon: Charlott Rakes, MD;  Location: [facility] ENDOSCOPY;  Service: Endoscopy;  Laterality: N/A;   HOT HEMOSTASIS  11/22/2021   Procedure: HOT HEMOSTASIS (ARGON PLASMA COAGULATION/BICAP);  Surgeon: Lynann Bologna, DO;  Location: [facility] ENDOSCOPY;  Service: Gastroenterology;;   [address] SELECTIVE EACH ADDITIONAL VESSEL  02/05/2021   IR ANGIOGRAM VISCERAL SELECTIVE  02/05/2021   IR EMBO ART  VEN HEMORR LYMPH EXTRAV  INC GUIDE ROADMAPPING  02/05/2021   IR US GUIDE VASC ACCESS RIGHT  02/05/2021   NASAL HEMORRHAGE CONTROL     "for bleeding"    SAVORY DILATION  02/26/2011   Procedure: SAVORY DILATION;  Surgeon: Barrie Folk, MD;  Location: [name];  Service: Endoscopy;  Laterality: N/A;  c-arm needed   SCLEROTHERAPY  12/24/2020   Procedure: SCLEROTHERAPY;  Surgeon: Willis Modena, MD;  Location: [facility] ENDOSCOPY;  Service: Endoscopy;;   SCLEROTHERAPY   01/19/2021   Procedure: Susa Day;  Surgeon: Charlott Rakes, MD;  Location: [facility] ENDOSCOPY;  Service: Endoscopy;;   SUBMUCOSAL INJECTION  12/04/2017   Procedure: SUBMUCOSAL INJECTION;  Surgeon: Charlott Rakes, MD;  Location: [facility] ENDOSCOPY;  Service: Endoscopy;;   HPI:  Pt is an 82 yo female presenting 10/21 from [facility] with AMS, R sided gaze deviation, and dysphagia x3 days. Per RN note, she arrived with ground pills in her mouth. Pt was under hospice care PTA. Pt was previously seen by SLP in 2016, recommending Dys 2 diet and thin liquids which was suspected to be her baseline diet given reports of difficulty masticating certain solids like breads at baseline. PMH includes: dementia, GERD, DM, CHF, anemia    Assessment / Plan / Recommendation  Clinical Impression  Pt has a cognitively-based dysphaghia and does not swallow throughout this evaluation. She occasionally closes her lips around a straw, but there is no sucking. SLP was able to siphon a small amount of water into her mouth x1 while she was attempting to close her lips, but the whole bolus spilled anteriorly back out of her mouth. Attempts at spoon, straw, self-feeding, and thicker consistency were not effective at improving acceptance or awareness to task. Reached out to MD and RN with findings - it is not likely for pt to have much if any PO intake at this time given current mentation. Clear liquid diet is ordered; will not necessarily change diet if this is ordered for comfort feeds, but would not offer to the pt unless she is alert and accepting of them. Would  focus more so on oral care to provide moisture and comfort to oral mucosa, and would consider alternative means of administering medications.  SLP Visit Diagnosis: Dysphagia, unspecified (R13.10)    Aspiration Risk  Severe aspiration risk;Risk for inadequate nutrition/hydration    Diet Recommendation Other (Comment) (comfort feeds per MD)    Medication  Administration: Via alternative means    Other  Recommendations Oral Care Recommendations: Oral care QID;Staff/trained caregiver to provide oral care Caregiver Recommendations: Have oral suction available    Recommendations for follow up therapy are one component of a multi-disciplinary discharge planning process, led by the attending physician.  Recommendations may be updated based on patient status, additional functional criteria and insurance authorization.  Follow up Recommendations  (tba)      Assistance Recommended at Discharge    Functional Status Assessment Patient has had a recent decline in their functional status and/or demonstrates limited ability to make significant improvements in function in a reasonable and predictable amount of time  Frequency and Duration min 2x/week  2 weeks       Prognosis Prognosis for improved oropharyngeal function: Guarded Barriers to Reach Goals: Cognitive deficits;Severity of deficits      Swallow Study   General HPI: Pt is an 82 yo female presenting 10/21 from [facility] with AMS, R sided gaze deviation, and dysphagia x3 days. Per RN note, she arrived with ground pills in her mouth. Pt was under hospice care PTA. Pt was previously seen by SLP in 2016, recommending Dys 2 diet and thin liquids which was suspected to be her baseline diet given reports of difficulty masticating certain solids like breads at baseline. PMH includes: dementia, GERD, DM, CHF, anemia Type of Study: Bedside Swallow Evaluation Previous Swallow Assessment: see HPI Diet Prior to this Study: Clear liquid diet;Thin liquids (Level 0) Temperature Spikes Noted: No Respiratory Status: Nasal cannula History of Recent Intubation: No Behavior/Cognition: Lethargic/Drowsy;Doesn't follow directions Oral Cavity Assessment: Other (comment) (not able to see well) Oral Care Completed by SLP: No Oral Cavity - Dentition: Poor condition;Missing dentition Self-Feeding Abilities: Total  assist Patient Positioning: Postural control interferes with function Baseline Vocal Quality: Normal Volitional Cough: Cognitively unable to elicit Volitional Swallow: Unable to elicit    Oral/Motor/Sensory Function Overall Oral Motor/Sensory Function: Other (comment) (not following commands to assess)   Ice Chips Ice chips: Not tested   Thin Liquid Thin Liquid: Impaired Presentation: Spoon;Straw Oral Phase Impairments: Poor awareness of bolus;Reduced labial seal Oral Phase Functional Implications: Right anterior spillage Pharyngeal  Phase Impairments: Unable to trigger swallow    Nectar Thick Nectar Thick Liquid: Not tested   Honey Thick Honey Thick Liquid: Not tested   Puree Puree: Impaired Presentation: Spoon Oral Phase Impairments: Poor awareness of bolus;Other (comment) (no bolus acceptance)   Solid     Solid: Not tested      Mahala Menghini., M.A. CCC-SLP Acute Rehabilitation Services Office [phone]  Secure chat preferred  12/01/2022,10:11 AM

## 2022-12-01 NOTE — Progress Notes (Signed)
   12/01/22 0930  Assess: MEWS Score  Temp 97.6 F (36.4 C)  BP (!) 148/82  MAP (mmHg) 104  Pulse Rate 78  Resp 15  Level of Consciousness Responds to Pain  SpO2 100 %  O2 Device Nasal Cannula  O2 Flow Rate (L/min) 2 L/min  Assess: MEWS Score  MEWS Temp 0  MEWS Systolic 0  MEWS Pulse 0  MEWS RR 0  MEWS LOC 2  MEWS Score 2  MEWS Score Color Yellow  Assess: if the MEWS score is Yellow or Red  Were vital signs accurate and taken at a resting state? Yes  Does the patient meet 2 or more of the SIRS criteria? No  MEWS guidelines implemented  Yes, yellow  Treat  MEWS Interventions Considered administering scheduled or prn medications/treatments as ordered  Take Vital Signs  Increase Vital Sign Frequency  Yellow: Q2hr x1, continue Q4hrs until patient remains green for 12hrs  Escalate  MEWS: Escalate Yellow: Discuss with charge nurse and consider notifying provider and/or RRT  Notify: Charge Nurse/RN  Name of Charge Nurse/RN Notified Miriam,RN  Provider Notification  Provider Name/Title Adhikari,MD  Date Provider Notified 12/01/22  Time Provider Notified 1000  Method of Notification Page  Notification Reason Other (Comment) (Yellow MEWS)  Provider response No new orders (per MD BP is good)  Date of Provider Response 12/01/22  Time of Provider Response 1001  Assess: SIRS CRITERIA  SIRS Temperature  0  SIRS Pulse 0  SIRS Respirations  0  SIRS WBC 0  SIRS Score Sum  0

## 2022-12-01 NOTE — Progress Notes (Signed)
[facility] 09 [facility] patient visit   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 Hospice was notified after patient was transferred. She was admitted to the hospital on 10.21 with a diagnosis of Altered Mental Status. Per Dr. Patric Dykes with [facility] this is a related hospital admission.   Patient remains lethargic only waking at times to painful stimuli, she is showing no signs of pain or distress. She has not eaten for several days and weakness and mentation have worsened. Discussed events with patient's alternate decision maker her niece [name] and they understand that this is very likely disease progression and patient will very likely continue to have worsening mentation that could lead to end of life. They understand that at this time she does not have any active symptom management needs so therefor would not currently be eligible for IPU as there a no routine beds available. She reports that she is comfortable with patient returning to her facility to continue to be followed there.   Patient is inpatient appropriate due to testing related to recent change in status as well as IV fluids and medications.   Vital Signs- 97.6/78/15    148/82  I&O- 1015/not recorded  Abnormal Labs-  pH 7.53, pO2 48, Na+ 150, Creatinine 1.22, Ca+ 7.8, Albumin <1.5, Ammonia 81, GFR 44, Hgb 7.3, Hct 24.2  Diagnostics- CT and MRI of brain without acute intercranial abnormality    IV/PRN Meds- D5 @75ml /H, LR bolus, Keppra 1500mg  IV x1, Keppra 500mg  IV q12H, Morphine 2mg  IV x1  Problem List: Altered mental status on the background of dementia: Unclear etiology but likely metabolic encephalopathy.  Patient is confused at baseline but usually she talks and eats.  Noticed to have  decreased oral intake last few days, does not talk.  Has been noticed to have gradual decline in last few months.  UA not suspicious for UTI. Sodium level of 151.  Hypernatremia/dehydration could have contributed.  CT head did not show any acute findings. EEG did not show any seizures but showed diffuse encephalopathy.  MRI did not show any acute findings. TSH level , vitamin B12 not low. Ammonia level high, unclear etiology.  Since he cannot swallow, ordered lactulose enema.   Goals of care: Elderly patient with multiple comorbidities, baseline dementia.  Confused at baseline.  Presented with more confusion, poor oral intake.  Patient has very poor quality of life.  She is apparently she is involved with hospice as an outpatient but still remains full code.  Consulted  palliative care. Had a long discussion with her she is at bedside and also talked to her husband at the same time.  We decided to change her full CODE STATUS to DNR. She is appropriate for  residential hospice.    Hypernatremia/ CKD stage IIIa: On reviewing her previous records, her baseline creatinine is 1.2-1.3.  Last creatinine was 0.9 on 03/16/2022.  Currently kidney function at baseline.  BUN not elevated.  But may be dehydrated due to poor oral intake.  Sodium of 151. Started on D5 W.  Patient is also hypoglycemic.   History of seizure: Takes Keppra.  Not sure if she had seizure episode.  Continue Keppra at current dose but she is unable to take anything by mouth..  EEG did not show any seizure.  Discharge Planning-  Ongoing, likely back to facility tomorrow Family Contact- talked with niece [name] several times  by phone IDT: Updated Goals of Care: DNR per this hospitalization  Thank you for the opportunity to participate in this patient's care, please don't hesitate to call for any hospice related questions or concerns.  Thea Gist BSN, [employer] [phone]

## 2022-12-02 DIAGNOSIS — R400 Somnolence: Secondary | ICD-10-CM

## 2022-12-02 DIAGNOSIS — Z515 Encounter for palliative care: Secondary | ICD-10-CM

## 2022-12-02 DIAGNOSIS — G40909 Epilepsy, unspecified, not intractable, without status epilepticus: Secondary | ICD-10-CM | POA: Diagnosis not present

## 2022-12-02 DIAGNOSIS — F03C0 Unspecified dementia, severe, without behavioral disturbance, psychotic disturbance, mood disturbance, and anxiety: Secondary | ICD-10-CM

## 2022-12-02 LAB — BASIC METABOLIC PANEL
Anion gap: 8 (ref 5–15)
BUN: 21 mg/dL (ref 8–23)
CO2: 29 mmol/L (ref 22–32)
Calcium: 7.9 mg/dL — ABNORMAL LOW (ref 8.9–10.3)
Chloride: 105 mmol/L (ref 98–111)
Creatinine, Ser: 1.1 mg/dL — ABNORMAL HIGH (ref 0.44–1.00)
GFR, Estimated: 50 mL/min — ABNORMAL LOW (ref >60)
Glucose, Bld: 206 mg/dL — ABNORMAL HIGH (ref 70–99)
Potassium: 3.3 mmol/L — ABNORMAL LOW (ref 3.5–5.1)
Sodium: 142 mmol/L (ref 135–145)

## 2022-12-02 LAB — AMMONIA: Ammonia: 89 umol/L — ABNORMAL HIGH (ref 9–35)

## 2022-12-02 MED ORDER — LACTULOSE ENEMA
300.0000 mL | Freq: Every day | ORAL | Status: DC
  Filled 2022-12-02: qty 300

## 2022-12-02 MED ORDER — LORAZEPAM 2 MG/ML PO CONC: 1.0000 mg | ORAL | Status: DC | PRN

## 2022-12-02 MED ORDER — POTASSIUM CHLORIDE 10 MEQ/100ML IV SOLN: 10.0000 meq | INTRAVENOUS | Status: DC

## 2022-12-02 MED ORDER — LORAZEPAM 2 MG/ML PO CONC: 1.0000 mg | ORAL | 0 refills | Status: AC | PRN

## 2022-12-02 MED ORDER — MORPHINE SULFATE 10 MG/5ML PO SOLN: 5.0000 mg | ORAL | 0 refills | Status: AC | PRN

## 2022-12-02 MED ORDER — MORPHINE SULFATE 10 MG/5ML PO SOLN: 5.0000 mg | ORAL | Status: DC | PRN

## 2022-12-02 NOTE — Discharge Summary (Signed)
Physician Discharge Summary  [name] ZOX:[id number] DOB: 1940-07-20 DOA: 11/30/2022  PCP: Laretta Bolster, MD  Admit date: 11/30/2022 Discharge date: 12/02/2022  Admitted From: SNF Disposition: SNF with hospice  Discharge Condition:Stable CODE STATUS:Comfort Care Diet recommendation: Dysphagia 1  Brief/Interim Summary: Patient is a 82 y.o. female with medical history significant of advanced dementia,lung cancer, type 2 diabetes, diastolic CHF, hypertension, hyperlipidemia, seizure disorder who was brought to the emergency room for complaint of increased confusion from baseline.  As per the report, she was having 3-day history of increased confusion, difficulty in swallowing, right-sided gaze deviation.  She had significant decline since last few months.  Patient is currently enrolled with hospice as an outpatient but remains full code.  Goals of care discussed with family in the emergency department and CODE STATUS changed to DNR.  Her quality of life is very poor.  She has poor prognosis.  She does not have any oral intake, hypoglycemic.  Palliative care consulted for goals of care.  Family wants her back to the nursing facility with hospice care.  Being discharged today.  Following problems were addressed during the hospitalization: Altered mental status on the background of dementia: Unclear etiology but likely metabolic encephalopathy.  Patient is confused at baseline but usually she talks and eats.  Noticed to have decreased oral intake last few days, does not talk.  Has been noticed to have gradual decline in last few months.  UA not suspicious for UTI. Sodium level of 151 on admission.  Hypernatremia/dehydration could have contributed.  CT head did not show any acute findings. EEG did not show any seizures but showed diffuse encephalopathy.  MRI did not show any acute findings. TSH level , vitamin B12 not low. Ammonia level high, unclear etiology.  Since he cannot swallow, ordered  lactulose enema. Sodium level has improved today.   Goals of care: Elderly patient with multiple comorbidities, baseline dementia.  Confused at baseline.  Presented with more confusion, poor oral intake.  Patient has very poor quality of life.  She is apparently she is involved with hospice as an outpatient but still remains full code.  Consulted  palliative care. Had a long discussion with her she is at bedside and also talked to her husband at the same time.  We decided to change her full CODE STATUS to DNR. She will continue hospice services at the SNF   Hypernatremia/ CKD stage IIIa: On reviewing her previous records, her baseline creatinine is 1.2-1.3.  Last creatinine was 0.9 on 03/16/2022.  Currently kidney function at baseline.  BUN not elevated.  But may be dehydrated due to poor oral intake. Started on D5 W.  Sodium level has  improved   Type 2 diabetes: Apparently she takes insulin as per her med rec.  Patient is hypoglycemic.  Stop insulin   Leucocytosis: This is most likely reactive,resolved.  Continue to hold antibiotics   Hypertension: Takes carvedilol at home.  Currently on hold.  Blood pressure stable.   History of seizure: Takes Keppra.  Not sure if she had seizure episode.   EEG did not show any seizure.   Hx of diastolic CHF: Takes Demadex as needed.  Last echo showed EF of 70 to 75%, hyperdynamic function, diastolic function could not be evaluated .   Normocytic anemia: This is likely chronic.  Hemoglobin currently in the range of 7.  No evidence of acute blood loss   History of hyperlipidemia: She was on  on Lipitor.  Discharge Diagnoses:  Principal Problem:   AMS (altered mental status) Active Problems:   Type 2 diabetes mellitus ([name]   Chronic diastolic CHF (congestive heart failure) ([name]   Essential hypertension   CKD (chronic kidney disease), stage III ([name]   Dementia ([name]   PAF (paroxysmal atrial fibrillation) [facility])    Discharge  Instructions  Discharge Instructions     Diet general   Complete by: As directed    Dysphagia 1   Discharge instructions   Complete by: As directed    1)Please follow up with hospice services   Increase activity slowly   Complete by: As directed    No wound care   Complete by: As directed       Allergies as of 12/02/2022       Reactions   Asa [aspirin] Nausea And Vomiting        Medication List     STOP taking these medications    acetaminophen 325 MG tablet Commonly known as: TYLENOL   atorvastatin 10 MG tablet Commonly known as: LIPITOR   Basaglar KwikPen 100 UNIT/ML   carvedilol 3.125 MG tablet Commonly known as: COREG   ferrous sulfate 325 (65 FE) MG tablet   gabapentin 100 MG capsule Commonly known as: NEURONTIN   levETIRAcetam 500 MG tablet Commonly known as: KEPPRA   mirtazapine 7.5 MG tablet Commonly known as: REMERON   morphine 20 MG/ML concentrated solution Commonly known as: ROXANOL Replaced by: morphine 10 MG/5ML solution   Nutritional Supplement Liqd   NUTRITIONAL SUPPLEMENT PO   oxymetazoline 0.05 % nasal spray Commonly known as: AFRIN   PROTEIN PO   sodium chloride 0.65 % Soln nasal spray Commonly known as: OCEAN   torsemide 20 MG tablet Commonly known as: DEMADEX   traMADol 50 MG tablet Commonly known as: ULTRAM   triamcinolone ointment 0.5 % Commonly known as: KENALOG   Trulicity 3 MG/0.5ML Soaj Generic drug: Dulaglutide       TAKE these medications    lactulose 10 GM/15ML solution Commonly known as: CHRONULAC Take 10 g by mouth 3 (three) times daily.   LORazepam 2 MG/ML concentrated solution Commonly known as: ATIVAN Place 0.5 mLs (1 mg total) under the tongue every 4 (four) hours as needed for anxiety, seizure, sedation or sleep.   morphine 10 MG/5ML solution Take 2.5 mLs (5 mg total) by mouth every 4 (four) hours as needed for severe pain (pain score 7-10). Replaces: morphine 20 MG/ML concentrated  solution   omeprazole 40 MG capsule Commonly known as: PRILOSEC Take 40 mg by mouth 2 (two) times daily.        Allergies  Allergen Reactions   Asa [Aspirin] Nausea And Vomiting    Consultations: Palliative care, neurology   Procedures/Studies: MR BRAIN WO CONTRAST  Result Date: 11/30/2022 CLINICAL DATA:  Encephalopathy. EXAM: MRI HEAD WITHOUT CONTRAST TECHNIQUE: Multiplanar, multiecho pulse sequences of the brain and surrounding structures were obtained without intravenous contrast. COMPARISON:  Brain MRI 01/29/2021. FINDINGS: Brain: Motion degraded study. Within this limitation, no acute infarct or hemorrhage. Stable moderate chronic small-vessel disease and generalized volume loss. No hydrocephalus or extra-axial collection. No mass or midline shift. Vascular: Normal flow voids. Skull and upper cervical spine: Normal marrow signal. Sinuses/Orbits: No acute findings. Other: None. IMPRESSION: Motion degraded study. Within this limitation, no acute intracranial process. Electronically Signed   By: Orvan Falconer M.D.   On: 11/30/2022 21:57   EEG adult  Result Date: 11/30/2022 Charlsie Quest, MD     11/30/2022  6:41 PM Patient Name: SHALAINA DZIK MRN: [id number] Epilepsy Attending: Charlsie Quest Referring Physician/Provider: Rejeana Brock, MD Date: 11/30/2022 Duration: 22.19 mins Patient history: 82 y.o. female with progressive encephalopathy over the past few days. EEG to evaluate for seizure Level of alertness: comatose/ lethargic AEDs during EEG study: LEV Technical aspects: This EEG study was done with scalp electrodes positioned according to the 10-20 International system of electrode placement. Electrical activity was reviewed with band pass filter of 1-70Hz , sensitivity of 7 uV/mm, display speed of 38mm/sec with a 60Hz  notched filter applied as appropriate. EEG data were recorded continuously and digitally stored.  Video monitoring was available and reviewed as  appropriate. Description: EEG showed continuous generalized 3 to 5 Hz theta-delta slowing. Hyperventilation and photic stimulation were not performed.   ABNORMALITY - Continuous slow, generalized IMPRESSION: This study is suggestive of moderate to severe diffuse encephalopathy. No seizures or epileptiform discharges were seen throughout the recording. Charlsie Quest   DG Chest Port 1 View  Result Date: 11/30/2022 CLINICAL DATA:  Altered mental status. EXAM: PORTABLE CHEST 1 VIEW COMPARISON:  07/04/2021. FINDINGS: There is small-to-moderate layering left pleural effusion. There is superimposed approximately 2.1 x 3.2 cm opacity overlying the left upper mid lung zone. The opacity is present since the prior study dating back to 03/02/2021. However, slightly increased in size and density when compared to the prior exam. There is sharply marginated triangular opacity overlying the right mid lung zone, laterally. This may represent consolidation versus atelectasis. There are additional atelectatic changes at the lung bases. Right lateral costophrenic angle is obscured by overlying monitoring electrodes however, there is probable layering trace right pleural effusion. No pneumothorax. Stable cardio-mediastinal silhouette. No acute osseous abnormalities. The soft tissues are within normal limits. Right-sided CT Port-A-Cath is seen with its tip overlying the cavoatrial junction region. IMPRESSION: 1. Small-to-moderate layering left pleural effusion. 2. Superimposed opacity overlying the left upper mid lung zone, slightly increased in size and density when compared to the prior exam dating back to 03/02/2021. Recommend additional imaging to exclude slow growing neoplastic process, if not previously performed. Electronically Signed   By: Jules Schick M.D.   On: 11/30/2022 16:11   CT Head Wo Contrast  Result Date: 11/30/2022 CLINICAL DATA:  Right-sided gaze and dysphagia for 3 days EXAM: CT HEAD WITHOUT CONTRAST  TECHNIQUE: Contiguous axial images were obtained from the base of the skull through the vertex without intravenous contrast. RADIATION DOSE REDUCTION: This exam was performed according to the departmental dose-optimization program which includes automated exposure control, adjustment of the mA and/or kV according to patient size and/or use of iterative reconstruction technique. COMPARISON:  CT head 02/03/2021 FINDINGS: Brain: There is no acute intracranial hemorrhage, extra-axial fluid collection, or acute infarct. Parenchymal volume is within expected limits for age. The ventricles are stable in size. Gray-white differentiation is preserved. Patchy hypodensity in the supratentorial white matter likely reflects underlying chronic small-vessel ischemic change. The pituitary and suprasellar region are normal. There is no mass lesion. There is no mass effect or midline shift. Vascular: There is calcification of the bilateral carotid siphons. Skull: Normal. Negative for fracture or focal lesion. Sinuses/Orbits: The imaged paranasal sinuses are clear. Bilateral lens implants are in place. The globes and orbits are otherwise unremarkable. Other: The mastoid air cells and middle ear cavities are clear. IMPRESSION: No acute intracranial pathology. Electronically Signed   By: Lesia Hausen M.D.   On: 11/30/2022 15:47      Subjective: Patient seen  and examined the bedside today.  She remains encephalopathic.  She was in mild to moderate distress today, not sure where the pain is.  Eyes closed.  Hemodynamically stable. Long discussion held with the niece Angelique Blonder on phone again and she wants her back to SNF with hospice.  Discharge Exam: Vitals:   12/02/22 0332 12/02/22 0829  BP: (!) 144/76 (!) 147/89  Pulse: 90 79  Resp: 17 16  Temp: 98.3 F (36.8 C) 97.8 F (36.6 C)  SpO2: 100% 96%   Vitals:   12/01/22 2137 12/02/22 0038 12/02/22 0332 12/02/22 0829  BP: 125/67 110/71 (!) 144/76 (!) 147/89  Pulse: (!) 110  (!) 55 90 79  Resp:  18 17 16   Temp: 98.7 F (37.1 C) 97.7 F (36.5 C) 98.3 F (36.8 C) 97.8 F (36.6 C)  TempSrc: Axillary Axillary Axillary Oral  SpO2: 100% 93% 100% 96%    General: Pt is not alert or awake Cardiovascular: RRR, S1/S2 +, no rubs, no gallops Respiratory: Diminished breath sounds bilaterally, no wheezing, no rhonchi Abdominal: Soft, NT, ND, bowel sounds + Extremities: Edema of right upper extremity, no cyanosis    The results of significant diagnostics from this hospitalization (including imaging, microbiology, ancillary and laboratory) are listed below for reference.     Microbiology: Recent Results (from the past 240 hour(s))  Resp panel by RT-PCR (RSV, Flu A&B, Covid) Anterior Nasal Swab     Status: None   Collection Time: 11/30/22 12:50 PM   Specimen: Anterior Nasal Swab  Result Value Ref Range Status   SARS Coronavirus 2 by RT PCR NEGATIVE NEGATIVE Final   Influenza A by PCR NEGATIVE NEGATIVE Final   Influenza B by PCR NEGATIVE NEGATIVE Final    Comment: (NOTE) The Xpert Xpress SARS-CoV-2/FLU/RSV plus assay is intended as an aid in the diagnosis of influenza from Nasopharyngeal swab specimens and should not be used as a sole basis for treatment. Nasal washings and aspirates are unacceptable for Xpert Xpress SARS-CoV-2/FLU/RSV testing.  Fact Sheet for Patients: BloggerCourse.com  Fact Sheet for Healthcare Providers: SeriousBroker.it  This test is not yet approved or cleared by the [place] FDA and has been authorized for detection and/or diagnosis of SARS-CoV-2 by FDA under an Emergency Use Authorization (EUA). This EUA will remain in effect (meaning this test can be used) for the duration of the COVID-19 declaration under Section 564(b)(1) of the Act, 21 U.S.C. section 360bbb-3(b)(1), unless the authorization is terminated or revoked.     Resp Syncytial Virus by PCR NEGATIVE NEGATIVE Final     Comment: (NOTE) Fact Sheet for Patients: BloggerCourse.com  Fact Sheet for Healthcare Providers: SeriousBroker.it  This test is not yet approved or cleared by the [place] FDA and has been authorized for detection and/or diagnosis of SARS-CoV-2 by FDA under an Emergency Use Authorization (EUA). This EUA will remain in effect (meaning this test can be used) for the duration of the COVID-19 declaration under Section 564(b)(1) of the Act, 21 U.S.C. section 360bbb-3(b)(1), unless the authorization is terminated or revoked.  Performed at [facility], [address], [place], [place] [address]      Labs: BNP (last 3 results) No results for input(s): "BNP" in the last 8760 hours. Basic Metabolic Panel: Recent Labs  Lab 11/30/22 1230 11/30/22 1232 11/30/22 1249 12/01/22 0831 12/02/22 0511  NA 149* 149* 151* 150* 142  K 3.8 3.8 3.8 3.5 3.3*  CL  --  108 111 109 105  CO2  --   --  31 31 29   GLUCOSE  --  52* 69* 179* 206*  BUN  --  23 20 22 21   CREATININE  --  1.30* 1.22* 1.22* 1.10*  CALCIUM  --   --  8.0* 7.8* 7.9*   Liver Function Tests: Recent Labs  Lab 11/30/22 1249  AST 32  ALT 21  ALKPHOS 178*  BILITOT 1.5*  PROT 4.9*  ALBUMIN <1.5*   No results for input(s): "LIPASE", "AMYLASE" in the last 168 hours. Recent Labs  Lab 12/01/22 0831 12/02/22 0511  AMMONIA 81* 89*   CBC: Recent Labs  Lab 11/30/22 1230 11/30/22 1232 11/30/22 1249 12/01/22 0831  WBC  --   --  11.6* 8.4  NEUTROABS  --   --  9.4*  --   HGB 8.8* 8.8* 8.2* 7.3*  HCT 26.0* 26.0* 27.4* 24.2*  MCV  --   --  100.7* 99.2  PLT  --   --  190 158   Cardiac Enzymes: No results for input(s): "CKTOTAL", "CKMB", "CKMBINDEX", "TROPONINI" in the last 168 hours. BNP: Invalid input(s): "POCBNP" CBG: Recent Labs  Lab 11/30/22 1226 11/30/22 1330 11/30/22 2130  GLUCAP 32* 119* 100*   D-Dimer No results for input(s): "DDIMER" in the  last 72 hours. Hgb A1c No results for input(s): "HGBA1C" in the last 72 hours. Lipid Profile No results for input(s): "CHOL", "HDL", "LDLCALC", "TRIG", "CHOLHDL", "LDLDIRECT" in the last 72 hours. Thyroid function studies Recent Labs    11/30/22 1249  TSH 0.991   Anemia work up Recent Labs    11/30/22 1250  VITAMINB12 1,246*   Urinalysis    Component Value Date/Time   COLORURINE AMBER (A) 11/30/2022 1450   APPEARANCEUR CLEAR 11/30/2022 1450   LABSPEC 1.020 11/30/2022 1450   LABSPEC 1.025 10/19/2006 1254   PHURINE 5.0 11/30/2022 1450   GLUCOSEU NEGATIVE 11/30/2022 1450   HGBUR NEGATIVE 11/30/2022 1450   BILIRUBINUR NEGATIVE 11/30/2022 1450   BILIRUBINUR neg 05/21/2016 1605   BILIRUBINUR Negative 10/19/2006 1254   KETONESUR NEGATIVE 11/30/2022 1450   PROTEINUR 30 (A) 11/30/2022 1450   UROBILINOGEN 0.2 05/21/2016 1605   UROBILINOGEN 0.2 10/02/2014 2307   NITRITE NEGATIVE 11/30/2022 1450   LEUKOCYTESUR NEGATIVE 11/30/2022 1450   LEUKOCYTESUR Negative 10/19/2006 1254   Sepsis Labs Recent Labs  Lab 11/30/22 1249 12/01/22 0831  WBC 11.6* 8.4   Microbiology Recent Results (from the past 240 hour(s))  Resp panel by RT-PCR (RSV, Flu A&B, Covid) Anterior Nasal Swab     Status: None   Collection Time: 11/30/22 12:50 PM   Specimen: Anterior Nasal Swab  Result Value Ref Range Status   SARS Coronavirus 2 by RT PCR NEGATIVE NEGATIVE Final   Influenza A by PCR NEGATIVE NEGATIVE Final   Influenza B by PCR NEGATIVE NEGATIVE Final    Comment: (NOTE) The Xpert Xpress SARS-CoV-2/FLU/RSV plus assay is intended as an aid in the diagnosis of influenza from Nasopharyngeal swab specimens and should not be used as a sole basis for treatment. Nasal washings and aspirates are unacceptable for Xpert Xpress SARS-CoV-2/FLU/RSV testing.  Fact Sheet for Patients: BloggerCourse.com  Fact Sheet for Healthcare  Providers: SeriousBroker.it  This test is not yet approved or cleared by the [place] FDA and has been authorized for detection and/or diagnosis of SARS-CoV-2 by FDA under an Emergency Use Authorization (EUA). This EUA will remain in effect (meaning this test can be used) for the duration of the COVID-19 declaration under Section 564(b)(1) of the Act, 21 U.S.C. section 360bbb-3(b)(1), unless  the authorization is terminated or revoked.     Resp Syncytial Virus by PCR NEGATIVE NEGATIVE Final    Comment: (NOTE) Fact Sheet for Patients: BloggerCourse.com  Fact Sheet for Healthcare Providers: SeriousBroker.it  This test is not yet approved or cleared by the [place] FDA and has been authorized for detection and/or diagnosis of SARS-CoV-2 by FDA under an Emergency Use Authorization (EUA). This EUA will remain in effect (meaning this test can be used) for the duration of the COVID-19 declaration under Section 564(b)(1) of the Act, 21 U.S.C. section 360bbb-3(b)(1), unless the authorization is terminated or revoked.  Performed at [facility], [address] [place], [place] [address]     Please note: You were cared for by a hospitalist during your hospital stay. Once you are discharged, your primary care physician will handle any further medical issues. Please note that NO REFILLS for any discharge medications will be authorized once you are discharged, as it is imperative that you return to your primary care physician (or establish a relationship with a primary care physician if you do not have one) for your post hospital discharge needs so that they can reassess your need for medications and monitor your lab values.    Time coordinating discharge: 40 minutes  SIGNED:   Burnadette Pop, MD  [facility] 12/02/2022, 11:14 AM Pager [phone]  If 7PM-7AM, please contact  night-coverage [email or web address] Password [email or web address]

## 2022-12-02 NOTE — TOC Transition Note (Signed)
Transition of Care [facility]) - CM/SW Discharge Note   Patient Details  Name: [name] MRN: [id number] Date of Birth: 25-Aug-1940  [facility] CM/SW Contact:  Carley Hammed, LCSW Phone Number: 12/02/2022, 11:21 AM   Clinical Narrative:    Pt to be transported to [facility] via [place]. Nurse to call report to [phone]   Final next level of care: Long Term Nursing Home Barriers to Discharge: Barriers Resolved   Patient Goals and CMS Choice      Discharge Placement                Patient chooses bed at:  [facility]) Patient to be transferred to facility by: [facility] Name of family member notified: Angelique Blonder Patient and family notified of of transfer: 12/02/22  Discharge Plan and Services Additional resources added to the After Visit Summary for                                       Social Determinants of Health (SDOH) Interventions SDOH Screenings   Food Insecurity: No Food Insecurity (11/30/2022)  Housing: Low Risk  (11/30/2022)  Transportation Needs: No Transportation Needs (11/30/2022)  Utilities: Not At Risk (11/30/2022)  Depression (PHQ2-9): Low Risk  (09/25/2021)  Financial Resource Strain: Low Risk  (09/06/2019)  Physical Activity: Inactive (09/06/2019)  Tobacco Use: Medium Risk (11/30/2022)     Readmission Risk Interventions    11/22/2021   11:30 AM  Readmission Risk Prevention Plan  Transportation Screening Complete  PCP or Specialist Appt within 5-7 Days Complete  Home Care Screening Complete  Medication Review (RN CM) Complete

## 2022-12-02 NOTE — Progress Notes (Signed)
Unable to find suitable vein for PIV, attempted x3 w/ [place], unsuccessful. Pt. Bil arms swollen. RN aware and will notify MD.

## 2022-12-02 NOTE — Progress Notes (Signed)
Report called to Conservation officer, nature at [facility].

## 2022-12-02 NOTE — Consult Note (Signed)
Consultation Note Date: 12/02/2022   Patient Name: [name]  DOB: [birth year]  MRN: [id number]  Age / Sex: 82 y.o., female  PCP: [name] Bolster, MD Referring Physician: Burnadette Pop, MD  Reason for Consultation:  Patient with history of dementia presented with more confusion, poor oral intake. As per the report, she was involved with outpatient hospice but still full code. Will need goals of care.  HPI/Patient Profile: 82 y.o. female  with past medical history of advanced dementia, lung cancer, DM2, CHF, HTN, HLD, seizure disorder admitted on 11/30/2022 with report of R sided gaze and dysphagia for 3 days.  In ED was hypoglycemic.  EEG completed with no findings of seizures. MRI was motion degraded but yielded no acute findings. During admission she is not eating or drinking or taking po medications.   Primary Decision Maker HCPOA - spouse and niece have HCPOA per their report  Discussion: Chart reviewed including labs, progress notes, imaging from this and previous encounters.  I have reviewed medical records including EPIC notes, labs and imaging, received report from RN, assessed the patient and spoke with HCPOA to discuss diagnosis prognosis, GOC, EOL wishes, disposition and options.  I introduced Palliative Medicine as specialized medical care for people living with serious illness. It focuses on providing relief from the symptoms and stress of a serious illness. The goal is to improve quality of life for both the patient and the family.  Advanced Care Planning:  We reviewed [name]'s acute illness and trajectory. She has had ongoing decline, is bedbound, not eating or drinking.  The difference between aggressive medical intervention and comfort care was considered and discussed in light of the patient's goals of care.   Advanced directives, concepts specific to code status, artifical feeding and  hydration, and rehospitalization were considered and discussed.  Comfort measures was described- Discussed transition to comfort measures only which includes stopping IV fluids, antibiotics, labs and providing symptom management for SOB, anxiety, nausea, vomiting, and other symptoms of dying.   Family agrees that comfort measures only is primary goal of care.   Hospice and Palliative Care services outpatient were explained and offered. Patient is currently under Hospice services with [facility]. Family would like her to continue to receive Hospice care.   Code status was discussed- Encouraged patient/family to consider DNR/DNI status understanding evidenced based poor outcomes in similar hospitalized patients, as the cause of the arrest is likely associated with chronic/terminal disease rather than a reversible acute cardio-pulmonary event. Encouraged continued comfort measures only as patient is declining towards end of life.  I also recommended calling hospice RN to evaluate patient at SNF in the future rather than proceeding to hospital- to limit patient's suffering with changing venues and travel which can be very uncomfortable.   Total ACP time: 30 minutes   SUMMARY OF RECOMMENDATIONS -D/C po medications- per [name] was started for absent seizures that did not cause patient discomfort- recommend start lorazepam solution 1mg  sublingual every 4 hours prn for anxiety, sedation, seizure  -Morphine 5mg   po every 4 hours severe pain or agitation   Code Status/Advance Care Planning: DNR   Prognosis:   < 6 weeks  Discharge Planning: Skilled Nursing Facility with Hospice  Primary Diagnoses: Present on Admission:  AMS (altered mental status)  PAF (paroxysmal atrial fibrillation) ([name]  Chronic diastolic CHF (congestive heart failure) ([name]  Essential hypertension  CKD (chronic kidney disease), stage III ([name]  Dementia ([name]   Review of Systems  Unable to perform ROS: Mental  status change    Physical Exam Vitals and nursing note reviewed.  Constitutional:      General: She is not in acute distress.    Appearance: She is ill-appearing.  Pulmonary:     Effort: Pulmonary effort is normal.  Neurological:     Comments: Lethargic, unintelligible speech     Vital Signs: BP (!) 147/89 (BP Location: Right Arm)   Pulse 79   Temp 97.8 F (36.6 C) (Oral)   Resp 16   LMP  (LMP Unknown)   SpO2 96%  Pain Scale: 0-10   Pain Score: 0-No pain   SpO2: SpO2: 96 % O2 Device:SpO2: 96 % O2 Flow Rate: .O2 Flow Rate (L/min): 2 L/min  IO: Intake/output summary:  Intake/Output Summary (Last 24 hours) at 12/02/2022 1036 Last data filed at 12/02/2022 0011 Gross per 24 hour  Intake 245.74 ml  Output 400 ml  Net -154.26 ml    LBM: Last BM Date : 12/01/22 Baseline Weight:   Most recent weight:         Thank you for this consult. Palliative medicine will continue to follow and assist as needed.   Signed by: [name], AGNP-C Palliative Medicine  Time includes:   Preparing to see the patient (e.g., review of tests) Obtaining and/or reviewing separately obtained history Performing a medically necessary appropriate examination and/or evaluation Counseling and educating the patient/family/caregiver Ordering medications, tests, or procedures Referring and communicating with other health care professionals (when not reported separately) Documenting clinical information in the electronic or other health record Independently interpreting results (not reported separately) and communicating results to the patient/family/caregiver Care coordination (not reported separately) Clinical documentation   Please contact Palliative Medicine Team phone at [phone] for questions and concerns.  For individual provider: See Loretha Stapler

## 2022-12-05 ENCOUNTER — Other Ambulatory Visit: Payer: Self-pay

## 2023-01-10 DEATH — deceased

## 2023-01-26 ENCOUNTER — Encounter: Payer: Self-pay | Admitting: Hematology

## 2023-05-04 ENCOUNTER — Encounter: Payer: Self-pay | Admitting: Hematology

## 2023-08-27 ENCOUNTER — Encounter: Payer: Self-pay | Admitting: Advanced Practice Midwife

## 2023-09-17 ENCOUNTER — Other Ambulatory Visit: Payer: Self-pay
